# Patient Record
Sex: Female | Born: 1935
Health system: Southern US, Community
[De-identification: ages and names within clinical notes are randomized; demographics above are authoritative.]

## PROBLEM LIST (undated history)

## (undated) DIAGNOSIS — I340 Nonrheumatic mitral (valve) insufficiency: Secondary | ICD-10-CM

## (undated) DIAGNOSIS — M1990 Unspecified osteoarthritis, unspecified site: Secondary | ICD-10-CM

## (undated) DIAGNOSIS — M069 Rheumatoid arthritis, unspecified: Secondary | ICD-10-CM

## (undated) DIAGNOSIS — M797 Fibromyalgia: Secondary | ICD-10-CM

## (undated) DIAGNOSIS — K219 Gastro-esophageal reflux disease without esophagitis: Secondary | ICD-10-CM

## (undated) DIAGNOSIS — F32A Depression, unspecified: Secondary | ICD-10-CM

## (undated) DIAGNOSIS — F419 Anxiety disorder, unspecified: Secondary | ICD-10-CM

## (undated) DIAGNOSIS — K625 Hemorrhage of anus and rectum: Secondary | ICD-10-CM

## (undated) DIAGNOSIS — I4891 Unspecified atrial fibrillation: Secondary | ICD-10-CM

## (undated) DIAGNOSIS — G459 Transient cerebral ischemic attack, unspecified: Secondary | ICD-10-CM

## (undated) DIAGNOSIS — J449 Chronic obstructive pulmonary disease, unspecified: Secondary | ICD-10-CM

## (undated) DIAGNOSIS — K648 Other hemorrhoids: Secondary | ICD-10-CM

## (undated) DIAGNOSIS — K449 Diaphragmatic hernia without obstruction or gangrene: Secondary | ICD-10-CM

## (undated) DIAGNOSIS — I10 Essential (primary) hypertension: Secondary | ICD-10-CM

## (undated) DIAGNOSIS — M549 Dorsalgia, unspecified: Secondary | ICD-10-CM

## (undated) DIAGNOSIS — Z9889 Other specified postprocedural states: Secondary | ICD-10-CM

## (undated) DIAGNOSIS — Z5189 Encounter for other specified aftercare: Secondary | ICD-10-CM

## (undated) DIAGNOSIS — I2510 Atherosclerotic heart disease of native coronary artery without angina pectoris: Secondary | ICD-10-CM

## (undated) DIAGNOSIS — M810 Age-related osteoporosis without current pathological fracture: Secondary | ICD-10-CM

## (undated) DIAGNOSIS — M545 Low back pain, unspecified: Secondary | ICD-10-CM

## (undated) DIAGNOSIS — E785 Hyperlipidemia, unspecified: Secondary | ICD-10-CM

## (undated) DIAGNOSIS — R32 Unspecified urinary incontinence: Secondary | ICD-10-CM

## (undated) DIAGNOSIS — R112 Nausea with vomiting, unspecified: Secondary | ICD-10-CM

## (undated) DIAGNOSIS — H269 Unspecified cataract: Secondary | ICD-10-CM

## (undated) DIAGNOSIS — K222 Esophageal obstruction: Secondary | ICD-10-CM

## (undated) DIAGNOSIS — R195 Other fecal abnormalities: Secondary | ICD-10-CM

## (undated) DIAGNOSIS — D649 Anemia, unspecified: Secondary | ICD-10-CM

## (undated) DIAGNOSIS — J189 Pneumonia, unspecified organism: Secondary | ICD-10-CM

## (undated) DIAGNOSIS — G8929 Other chronic pain: Secondary | ICD-10-CM

## (undated) DIAGNOSIS — F319 Bipolar disorder, unspecified: Secondary | ICD-10-CM

## (undated) DIAGNOSIS — M479 Spondylosis, unspecified: Secondary | ICD-10-CM

## (undated) DIAGNOSIS — R011 Cardiac murmur, unspecified: Secondary | ICD-10-CM

## (undated) DIAGNOSIS — F329 Major depressive disorder, single episode, unspecified: Secondary | ICD-10-CM

## (undated) HISTORY — DX: Gastro-esophageal reflux disease without esophagitis: K21.9

## (undated) HISTORY — DX: Chronic obstructive pulmonary disease, unspecified: J44.9

## (undated) HISTORY — DX: Unspecified atrial fibrillation: I48.91

## (undated) HISTORY — PX: JOINT REPLACEMENT: SHX530

## (undated) HISTORY — DX: Essential (primary) hypertension: I10

## (undated) HISTORY — DX: Age-related osteoporosis without current pathological fracture: M81.0

## (undated) HISTORY — DX: Unspecified cataract: H26.9

## (undated) HISTORY — DX: Other fecal abnormalities: R19.5

## (undated) HISTORY — DX: Esophageal obstruction: K22.2

## (undated) HISTORY — DX: Low back pain, unspecified: M54.50

## (undated) HISTORY — DX: Diaphragmatic hernia without obstruction or gangrene: K44.9

## (undated) HISTORY — DX: Low back pain: M54.5

## (undated) HISTORY — DX: Major depressive disorder, single episode, unspecified: F32.9

## (undated) HISTORY — DX: Anxiety disorder, unspecified: F41.9

## (undated) HISTORY — DX: Bipolar disorder, unspecified: F31.9

## (undated) HISTORY — DX: Transient cerebral ischemic attack, unspecified: G45.9

## (undated) HISTORY — DX: Hyperlipidemia, unspecified: E78.5

## (undated) HISTORY — DX: Depression, unspecified: F32.A

## (undated) HISTORY — DX: Spondylosis, unspecified: M47.9

## (undated) HISTORY — DX: Anemia, unspecified: D64.9

## (undated) HISTORY — DX: Fibromyalgia: M79.7

## (undated) HISTORY — DX: Other hemorrhoids: K64.8

## (undated) HISTORY — DX: Atherosclerotic heart disease of native coronary artery without angina pectoris: I25.10

## (undated) HISTORY — PX: BACK SURGERY: SHX140

## (undated) HISTORY — PX: TUBAL LIGATION: SHX77

## (undated) HISTORY — PX: TOTAL KNEE ARTHROPLASTY: SHX125

## (undated) HISTORY — PX: LUMBAR LAMINECTOMY: SHX95

## (undated) HISTORY — DX: Hemorrhage of anus and rectum: K62.5

## (undated) HISTORY — PX: HERNIA REPAIR: SHX51

## (undated) HISTORY — DX: Nonrheumatic mitral (valve) insufficiency: I34.0

## (undated) HISTORY — PX: BLADDER SUSPENSION: SHX72

---
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
## 1959-11-05 HISTORY — PX: DILATION AND CURETTAGE OF UTERUS: SHX78

## 1980-11-04 HISTORY — PX: ABDOMINAL HYSTERECTOMY: SHX81

## 1989-11-04 DIAGNOSIS — Z5189 Encounter for other specified aftercare: Secondary | ICD-10-CM

## 1989-11-04 DIAGNOSIS — IMO0001 Reserved for inherently not codable concepts without codable children: Secondary | ICD-10-CM

## 1989-11-04 HISTORY — DX: Encounter for other specified aftercare: Z51.89

## 1989-11-04 HISTORY — DX: Reserved for inherently not codable concepts without codable children: IMO0001

## 1999-06-25 ENCOUNTER — Other Ambulatory Visit: Admission: RE | Admit: 1999-06-25 | Discharge: 1999-06-25 | Payer: Self-pay | Admitting: Family Medicine

## 1999-07-25 ENCOUNTER — Encounter: Payer: Self-pay | Admitting: Emergency Medicine

## 1999-07-25 ENCOUNTER — Emergency Department (HOSPITAL_COMMUNITY): Admission: EM | Admit: 1999-07-25 | Discharge: 1999-07-25 | Payer: Self-pay | Admitting: Emergency Medicine

## 2000-01-07 ENCOUNTER — Encounter: Payer: Self-pay | Admitting: Family Medicine

## 2000-01-07 ENCOUNTER — Encounter: Admission: RE | Admit: 2000-01-07 | Discharge: 2000-01-07 | Payer: Self-pay | Admitting: Family Medicine

## 2000-04-14 ENCOUNTER — Ambulatory Visit (HOSPITAL_COMMUNITY): Admission: RE | Admit: 2000-04-14 | Discharge: 2000-04-14 | Payer: Self-pay | Admitting: Neurosurgery

## 2000-04-14 ENCOUNTER — Encounter: Payer: Self-pay | Admitting: Neurosurgery

## 2000-05-27 ENCOUNTER — Other Ambulatory Visit: Admission: RE | Admit: 2000-05-27 | Discharge: 2000-05-27 | Payer: Self-pay | Admitting: *Deleted

## 2000-06-02 ENCOUNTER — Encounter: Admission: RE | Admit: 2000-06-02 | Discharge: 2000-06-02 | Payer: Self-pay | Admitting: Neurosurgery

## 2000-06-02 ENCOUNTER — Encounter: Payer: Self-pay | Admitting: Neurosurgery

## 2000-06-12 ENCOUNTER — Ambulatory Visit (HOSPITAL_COMMUNITY): Admission: RE | Admit: 2000-06-12 | Discharge: 2000-06-12 | Payer: Self-pay | Admitting: Neurosurgery

## 2000-06-12 ENCOUNTER — Encounter: Payer: Self-pay | Admitting: Neurosurgery

## 2001-05-10 ENCOUNTER — Encounter: Payer: Self-pay | Admitting: Emergency Medicine

## 2001-05-10 ENCOUNTER — Inpatient Hospital Stay (HOSPITAL_COMMUNITY): Admission: EM | Admit: 2001-05-10 | Discharge: 2001-05-14 | Payer: Self-pay | Admitting: Emergency Medicine

## 2001-05-11 ENCOUNTER — Encounter: Payer: Self-pay | Admitting: Cardiology

## 2001-05-14 ENCOUNTER — Encounter: Payer: Self-pay | Admitting: Cardiology

## 2001-05-29 ENCOUNTER — Encounter: Admission: RE | Admit: 2001-05-29 | Discharge: 2001-05-29 | Payer: Self-pay | Admitting: Cardiovascular Disease

## 2001-06-04 ENCOUNTER — Ambulatory Visit (HOSPITAL_COMMUNITY): Admission: RE | Admit: 2001-06-04 | Discharge: 2001-06-04 | Payer: Self-pay | Admitting: Internal Medicine

## 2001-06-04 ENCOUNTER — Encounter: Payer: Self-pay | Admitting: Internal Medicine

## 2001-11-04 ENCOUNTER — Encounter: Payer: Self-pay | Admitting: Family Medicine

## 2001-11-04 LAB — CONVERTED CEMR LAB

## 2002-01-22 ENCOUNTER — Encounter: Admission: RE | Admit: 2002-01-22 | Discharge: 2002-01-22 | Payer: Self-pay | Admitting: Neurosurgery

## 2002-01-22 ENCOUNTER — Encounter: Payer: Self-pay | Admitting: Neurosurgery

## 2002-02-05 ENCOUNTER — Encounter: Admission: RE | Admit: 2002-02-05 | Discharge: 2002-02-05 | Payer: Self-pay | Admitting: Neurosurgery

## 2002-02-05 ENCOUNTER — Encounter: Payer: Self-pay | Admitting: Neurosurgery

## 2002-02-26 ENCOUNTER — Encounter: Admission: RE | Admit: 2002-02-26 | Discharge: 2002-02-26 | Payer: Self-pay | Admitting: Neurosurgery

## 2002-02-26 ENCOUNTER — Encounter: Payer: Self-pay | Admitting: Neurosurgery

## 2002-04-30 ENCOUNTER — Encounter: Payer: Self-pay | Admitting: Family Medicine

## 2002-04-30 ENCOUNTER — Encounter: Admission: RE | Admit: 2002-04-30 | Discharge: 2002-04-30 | Payer: Self-pay | Admitting: Family Medicine

## 2003-07-13 ENCOUNTER — Encounter: Payer: Self-pay | Admitting: Radiology

## 2003-07-13 ENCOUNTER — Encounter: Payer: Self-pay | Admitting: Neurosurgery

## 2003-07-13 ENCOUNTER — Encounter: Admission: RE | Admit: 2003-07-13 | Discharge: 2003-07-13 | Payer: Self-pay | Admitting: Neurosurgery

## 2003-07-28 ENCOUNTER — Encounter: Admission: RE | Admit: 2003-07-28 | Discharge: 2003-07-28 | Payer: Self-pay | Admitting: Neurosurgery

## 2003-07-28 ENCOUNTER — Encounter: Payer: Self-pay | Admitting: Neurosurgery

## 2003-08-11 ENCOUNTER — Encounter: Payer: Self-pay | Admitting: Neurosurgery

## 2003-08-11 ENCOUNTER — Encounter: Admission: RE | Admit: 2003-08-11 | Discharge: 2003-08-11 | Payer: Self-pay | Admitting: Neurosurgery

## 2003-09-28 ENCOUNTER — Encounter: Admission: RE | Admit: 2003-09-28 | Discharge: 2003-09-28 | Payer: Self-pay | Admitting: Family Medicine

## 2003-11-09 ENCOUNTER — Encounter: Admission: RE | Admit: 2003-11-09 | Discharge: 2003-11-09 | Payer: Self-pay | Admitting: Family Medicine

## 2004-02-08 ENCOUNTER — Ambulatory Visit (HOSPITAL_COMMUNITY): Admission: RE | Admit: 2004-02-08 | Discharge: 2004-02-08 | Payer: Self-pay | Admitting: Cardiovascular Disease

## 2004-05-15 ENCOUNTER — Inpatient Hospital Stay (HOSPITAL_COMMUNITY): Admission: RE | Admit: 2004-05-15 | Discharge: 2004-05-20 | Payer: Self-pay | Admitting: Orthopedic Surgery

## 2004-06-28 ENCOUNTER — Ambulatory Visit (HOSPITAL_COMMUNITY): Admission: RE | Admit: 2004-06-28 | Discharge: 2004-06-28 | Payer: Self-pay | Admitting: Orthopedic Surgery

## 2004-09-19 ENCOUNTER — Ambulatory Visit: Payer: Self-pay | Admitting: Family Medicine

## 2004-12-04 ENCOUNTER — Ambulatory Visit: Payer: Self-pay | Admitting: Family Medicine

## 2004-12-19 ENCOUNTER — Ambulatory Visit: Payer: Self-pay | Admitting: Family Medicine

## 2005-01-01 ENCOUNTER — Ambulatory Visit: Payer: Self-pay | Admitting: Family Medicine

## 2005-01-22 ENCOUNTER — Ambulatory Visit: Payer: Self-pay | Admitting: Family Medicine

## 2005-02-15 ENCOUNTER — Encounter: Admission: RE | Admit: 2005-02-15 | Discharge: 2005-02-15 | Payer: Self-pay | Admitting: Family Medicine

## 2005-02-21 ENCOUNTER — Ambulatory Visit: Payer: Self-pay | Admitting: Gastroenterology

## 2005-02-25 ENCOUNTER — Ambulatory Visit (HOSPITAL_COMMUNITY): Admission: RE | Admit: 2005-02-25 | Discharge: 2005-02-25 | Payer: Self-pay | Admitting: Internal Medicine

## 2005-02-25 ENCOUNTER — Ambulatory Visit: Payer: Self-pay | Admitting: Internal Medicine

## 2005-03-01 ENCOUNTER — Ambulatory Visit: Payer: Self-pay | Admitting: Family Medicine

## 2005-03-04 ENCOUNTER — Emergency Department (HOSPITAL_COMMUNITY): Admission: EM | Admit: 2005-03-04 | Discharge: 2005-03-04 | Payer: Self-pay | Admitting: Emergency Medicine

## 2005-03-06 ENCOUNTER — Ambulatory Visit: Payer: Self-pay | Admitting: Family Medicine

## 2005-03-11 ENCOUNTER — Ambulatory Visit: Payer: Self-pay | Admitting: Internal Medicine

## 2005-03-14 ENCOUNTER — Ambulatory Visit: Payer: Self-pay | Admitting: Family Medicine

## 2005-03-19 ENCOUNTER — Encounter: Admission: RE | Admit: 2005-03-19 | Discharge: 2005-03-19 | Payer: Self-pay | Admitting: Neurology

## 2005-03-26 ENCOUNTER — Ambulatory Visit: Payer: Self-pay | Admitting: Family Medicine

## 2005-04-05 ENCOUNTER — Ambulatory Visit: Payer: Self-pay | Admitting: Family Medicine

## 2005-04-10 ENCOUNTER — Ambulatory Visit: Payer: Self-pay | Admitting: Family Medicine

## 2005-06-04 ENCOUNTER — Inpatient Hospital Stay (HOSPITAL_COMMUNITY): Admission: RE | Admit: 2005-06-04 | Discharge: 2005-06-13 | Payer: Self-pay | Admitting: Neurosurgery

## 2005-06-07 ENCOUNTER — Ambulatory Visit: Payer: Self-pay | Admitting: Pulmonary Disease

## 2005-06-11 ENCOUNTER — Ambulatory Visit: Payer: Self-pay | Admitting: Cardiology

## 2005-06-11 ENCOUNTER — Encounter: Payer: Self-pay | Admitting: Cardiology

## 2005-09-03 ENCOUNTER — Ambulatory Visit: Payer: Self-pay | Admitting: Family Medicine

## 2005-09-03 ENCOUNTER — Encounter: Admission: RE | Admit: 2005-09-03 | Discharge: 2005-09-03 | Payer: Self-pay | Admitting: Family Medicine

## 2006-04-08 ENCOUNTER — Ambulatory Visit: Payer: Self-pay | Admitting: Family Medicine

## 2006-04-09 ENCOUNTER — Ambulatory Visit: Payer: Self-pay | Admitting: Family Medicine

## 2006-04-17 ENCOUNTER — Ambulatory Visit: Payer: Self-pay

## 2006-06-19 ENCOUNTER — Ambulatory Visit: Payer: Self-pay | Admitting: Cardiovascular Disease

## 2006-07-17 ENCOUNTER — Ambulatory Visit: Payer: Self-pay | Admitting: Cardiovascular Disease

## 2006-07-17 ENCOUNTER — Ambulatory Visit: Payer: Self-pay | Admitting: Cardiology

## 2006-07-17 ENCOUNTER — Encounter: Payer: Self-pay | Admitting: Cardiology

## 2006-07-17 ENCOUNTER — Ambulatory Visit: Payer: Self-pay

## 2007-04-29 ENCOUNTER — Encounter: Payer: Self-pay | Admitting: Family Medicine

## 2007-04-29 DIAGNOSIS — F411 Generalized anxiety disorder: Secondary | ICD-10-CM | POA: Insufficient documentation

## 2007-04-29 DIAGNOSIS — M1990 Unspecified osteoarthritis, unspecified site: Secondary | ICD-10-CM | POA: Insufficient documentation

## 2007-04-29 DIAGNOSIS — K449 Diaphragmatic hernia without obstruction or gangrene: Secondary | ICD-10-CM | POA: Insufficient documentation

## 2007-04-29 DIAGNOSIS — K219 Gastro-esophageal reflux disease without esophagitis: Secondary | ICD-10-CM | POA: Insufficient documentation

## 2007-04-29 DIAGNOSIS — M479 Spondylosis, unspecified: Secondary | ICD-10-CM | POA: Insufficient documentation

## 2007-04-29 DIAGNOSIS — I10 Essential (primary) hypertension: Secondary | ICD-10-CM | POA: Insufficient documentation

## 2007-04-30 ENCOUNTER — Other Ambulatory Visit: Admission: RE | Admit: 2007-04-30 | Discharge: 2007-04-30 | Payer: Self-pay | Admitting: Family Medicine

## 2007-04-30 ENCOUNTER — Ambulatory Visit: Payer: Self-pay | Admitting: Family Medicine

## 2007-04-30 ENCOUNTER — Encounter: Payer: Self-pay | Admitting: Family Medicine

## 2007-04-30 LAB — CONVERTED CEMR LAB
Basophils Relative: 1.4 % — ABNORMAL HIGH (ref 0.0–1.0)
CO2: 35 meq/L — ABNORMAL HIGH (ref 19–32)
Creatinine, Ser: 0.9 mg/dL (ref 0.4–1.2)
Direct LDL: 168.7 mg/dL
FSH: 67.6 milliintl units/mL
HCT: 39.3 % (ref 36.0–46.0)
Hemoglobin: 13.5 g/dL (ref 12.0–15.0)
MCHC: 34.3 g/dL (ref 30.0–36.0)
Monocytes Absolute: 0.5 10*3/uL (ref 0.2–0.7)
Neutrophils Relative %: 70.1 % (ref 43.0–77.0)
Potassium: 3.5 meq/L (ref 3.5–5.1)
RDW: 12.2 % (ref 11.5–14.6)
Sodium: 142 meq/L (ref 135–145)
Total Bilirubin: 0.9 mg/dL (ref 0.3–1.2)
Total CHOL/HDL Ratio: 5.5
Total Protein: 7.6 g/dL (ref 6.0–8.3)
VLDL: 85 mg/dL — ABNORMAL HIGH (ref 0–40)

## 2007-05-25 ENCOUNTER — Ambulatory Visit: Payer: Self-pay | Admitting: Internal Medicine

## 2007-05-29 ENCOUNTER — Encounter: Admission: RE | Admit: 2007-05-29 | Discharge: 2007-05-29 | Payer: Self-pay | Admitting: Family Medicine

## 2007-06-04 ENCOUNTER — Encounter: Admission: RE | Admit: 2007-06-04 | Discharge: 2007-06-04 | Payer: Self-pay | Admitting: Family Medicine

## 2007-06-04 ENCOUNTER — Encounter: Payer: Self-pay | Admitting: Family Medicine

## 2007-06-05 ENCOUNTER — Encounter: Payer: Self-pay | Admitting: Internal Medicine

## 2007-06-08 ENCOUNTER — Encounter: Payer: Self-pay | Admitting: Family Medicine

## 2007-06-08 ENCOUNTER — Ambulatory Visit: Payer: Self-pay | Admitting: Internal Medicine

## 2007-06-18 ENCOUNTER — Ambulatory Visit (HOSPITAL_COMMUNITY): Admission: RE | Admit: 2007-06-18 | Discharge: 2007-06-18 | Payer: Self-pay | Admitting: Internal Medicine

## 2007-06-18 ENCOUNTER — Encounter: Payer: Self-pay | Admitting: Family Medicine

## 2007-06-24 ENCOUNTER — Ambulatory Visit: Payer: Self-pay | Admitting: Internal Medicine

## 2007-07-21 ENCOUNTER — Ambulatory Visit: Payer: Self-pay | Admitting: Internal Medicine

## 2007-07-24 ENCOUNTER — Encounter: Payer: Self-pay | Admitting: Family Medicine

## 2007-09-04 ENCOUNTER — Ambulatory Visit: Payer: Self-pay | Admitting: Family Medicine

## 2007-09-23 ENCOUNTER — Encounter: Payer: Self-pay | Admitting: Family Medicine

## 2008-01-06 ENCOUNTER — Encounter: Payer: Self-pay | Admitting: Family Medicine

## 2008-01-06 ENCOUNTER — Telehealth: Payer: Self-pay | Admitting: Family Medicine

## 2008-01-19 DIAGNOSIS — K222 Esophageal obstruction: Secondary | ICD-10-CM | POA: Insufficient documentation

## 2008-01-19 DIAGNOSIS — K648 Other hemorrhoids: Secondary | ICD-10-CM | POA: Insufficient documentation

## 2008-01-19 DIAGNOSIS — K449 Diaphragmatic hernia without obstruction or gangrene: Secondary | ICD-10-CM | POA: Insufficient documentation

## 2008-03-17 ENCOUNTER — Telehealth: Payer: Self-pay | Admitting: Family Medicine

## 2008-04-13 ENCOUNTER — Encounter: Payer: Self-pay | Admitting: Family Medicine

## 2008-04-19 ENCOUNTER — Encounter: Admission: RE | Admit: 2008-04-19 | Discharge: 2008-04-19 | Payer: Self-pay | Admitting: Neurosurgery

## 2008-05-10 ENCOUNTER — Ambulatory Visit: Payer: Self-pay | Admitting: Family Medicine

## 2008-05-10 DIAGNOSIS — R05 Cough: Secondary | ICD-10-CM | POA: Insufficient documentation

## 2008-05-10 DIAGNOSIS — F329 Major depressive disorder, single episode, unspecified: Secondary | ICD-10-CM | POA: Insufficient documentation

## 2008-05-10 DIAGNOSIS — M949 Disorder of cartilage, unspecified: Secondary | ICD-10-CM

## 2008-05-10 DIAGNOSIS — R0600 Dyspnea, unspecified: Secondary | ICD-10-CM | POA: Insufficient documentation

## 2008-05-10 DIAGNOSIS — T50995A Adverse effect of other drugs, medicaments and biological substances, initial encounter: Secondary | ICD-10-CM | POA: Insufficient documentation

## 2008-05-10 DIAGNOSIS — I6529 Occlusion and stenosis of unspecified carotid artery: Secondary | ICD-10-CM | POA: Insufficient documentation

## 2008-05-10 DIAGNOSIS — R058 Other specified cough: Secondary | ICD-10-CM | POA: Insufficient documentation

## 2008-05-10 DIAGNOSIS — E785 Hyperlipidemia, unspecified: Secondary | ICD-10-CM | POA: Insufficient documentation

## 2008-05-10 DIAGNOSIS — M549 Dorsalgia, unspecified: Secondary | ICD-10-CM

## 2008-05-10 DIAGNOSIS — K625 Hemorrhage of anus and rectum: Secondary | ICD-10-CM | POA: Insufficient documentation

## 2008-05-10 DIAGNOSIS — N390 Urinary tract infection, site not specified: Secondary | ICD-10-CM | POA: Insufficient documentation

## 2008-05-10 DIAGNOSIS — M899 Disorder of bone, unspecified: Secondary | ICD-10-CM | POA: Insufficient documentation

## 2008-05-10 DIAGNOSIS — D649 Anemia, unspecified: Secondary | ICD-10-CM | POA: Insufficient documentation

## 2008-05-10 DIAGNOSIS — G459 Transient cerebral ischemic attack, unspecified: Secondary | ICD-10-CM | POA: Insufficient documentation

## 2008-05-10 DIAGNOSIS — F3289 Other specified depressive episodes: Secondary | ICD-10-CM | POA: Insufficient documentation

## 2008-05-10 DIAGNOSIS — E039 Hypothyroidism, unspecified: Secondary | ICD-10-CM | POA: Insufficient documentation

## 2008-05-10 DIAGNOSIS — G8929 Other chronic pain: Secondary | ICD-10-CM | POA: Insufficient documentation

## 2008-05-10 LAB — CONVERTED CEMR LAB
Glucose, Urine, Semiquant: NEGATIVE
Specific Gravity, Urine: 1.02
pH: 5

## 2008-05-11 ENCOUNTER — Telehealth: Payer: Self-pay

## 2008-05-13 ENCOUNTER — Ambulatory Visit: Payer: Self-pay | Admitting: Family Medicine

## 2008-05-16 LAB — CONVERTED CEMR LAB: Vit D, 1,25-Dihydroxy: 31 (ref 30–89)

## 2008-05-17 ENCOUNTER — Ambulatory Visit: Payer: Self-pay | Admitting: Family Medicine

## 2008-05-23 ENCOUNTER — Ambulatory Visit: Payer: Self-pay

## 2008-05-23 ENCOUNTER — Encounter: Payer: Self-pay | Admitting: Family Medicine

## 2008-05-23 LAB — CONVERTED CEMR LAB
ALT: 26 units/L (ref 0–35)
AST: 20 units/L (ref 0–37)
Albumin: 4.1 g/dL (ref 3.5–5.2)
Alkaline Phosphatase: 58 units/L (ref 39–117)
BUN: 20 mg/dL (ref 6–23)
CO2: 29 meq/L (ref 19–32)
Eosinophils Relative: 2.7 % (ref 0.0–5.0)
GFR calc Af Amer: 91 mL/min
Glucose, Bld: 103 mg/dL — ABNORMAL HIGH (ref 70–99)
HCT: 37.1 % (ref 36.0–46.0)
Hemoglobin: 12.6 g/dL (ref 12.0–15.0)
Monocytes Absolute: 0.4 10*3/uL (ref 0.1–1.0)
Monocytes Relative: 7.2 % (ref 3.0–12.0)
Neutro Abs: 3.2 10*3/uL (ref 1.4–7.7)
Platelets: 234 10*3/uL (ref 150–400)
Potassium: 3.5 meq/L (ref 3.5–5.1)
Total CHOL/HDL Ratio: 6.4
Total Protein: 6.9 g/dL (ref 6.0–8.3)
WBC: 5.1 10*3/uL (ref 4.5–10.5)

## 2008-06-22 ENCOUNTER — Ambulatory Visit: Payer: Self-pay | Admitting: Family Medicine

## 2008-06-22 LAB — CONVERTED CEMR LAB
Bilirubin Urine: NEGATIVE
Blood in Urine, dipstick: NEGATIVE
Glucose, Urine, Semiquant: NEGATIVE
Ketones, urine, test strip: NEGATIVE
Protein, U semiquant: NEGATIVE
Specific Gravity, Urine: 1.01
pH: 5.5

## 2008-07-06 ENCOUNTER — Ambulatory Visit: Payer: Self-pay | Admitting: Family Medicine

## 2008-07-13 DIAGNOSIS — K921 Melena: Secondary | ICD-10-CM | POA: Insufficient documentation

## 2008-07-13 LAB — CONVERTED CEMR LAB
OCCULT 1: NEGATIVE
OCCULT 2: NEGATIVE
OCCULT 3: POSITIVE

## 2008-07-28 ENCOUNTER — Encounter: Admission: RE | Admit: 2008-07-28 | Discharge: 2008-07-28 | Payer: Self-pay | Admitting: Family Medicine

## 2008-07-28 ENCOUNTER — Encounter: Payer: Self-pay | Admitting: Family Medicine

## 2008-08-01 ENCOUNTER — Ambulatory Visit: Payer: Self-pay | Admitting: Internal Medicine

## 2008-08-01 DIAGNOSIS — R195 Other fecal abnormalities: Secondary | ICD-10-CM | POA: Insufficient documentation

## 2008-08-01 DIAGNOSIS — R1319 Other dysphagia: Secondary | ICD-10-CM | POA: Insufficient documentation

## 2008-08-02 ENCOUNTER — Ambulatory Visit: Payer: Self-pay | Admitting: Internal Medicine

## 2008-08-04 ENCOUNTER — Telehealth: Payer: Self-pay | Admitting: Family Medicine

## 2008-08-16 ENCOUNTER — Ambulatory Visit: Payer: Self-pay | Admitting: Family Medicine

## 2008-08-16 DIAGNOSIS — J449 Chronic obstructive pulmonary disease, unspecified: Secondary | ICD-10-CM | POA: Insufficient documentation

## 2008-08-16 DIAGNOSIS — J069 Acute upper respiratory infection, unspecified: Secondary | ICD-10-CM | POA: Insufficient documentation

## 2008-08-16 DIAGNOSIS — J209 Acute bronchitis, unspecified: Secondary | ICD-10-CM | POA: Insufficient documentation

## 2008-08-16 DIAGNOSIS — J4489 Other specified chronic obstructive pulmonary disease: Secondary | ICD-10-CM | POA: Insufficient documentation

## 2008-08-23 LAB — CONVERTED CEMR LAB
ALT: 31 units/L (ref 0–35)
Albumin: 4 g/dL (ref 3.5–5.2)
Basophils Absolute: 0.1 10*3/uL (ref 0.0–0.1)
Basophils Relative: 1 % (ref 0.0–3.0)
Cholesterol: 243 mg/dL (ref 0–200)
Direct LDL: 104.8 mg/dL
HCT: 37 % (ref 36.0–46.0)
Hemoglobin: 12.9 g/dL (ref 12.0–15.0)
MCHC: 35 g/dL (ref 30.0–36.0)
Monocytes Absolute: 0.5 10*3/uL (ref 0.1–1.0)
Neutro Abs: 5.2 10*3/uL (ref 1.4–7.7)
RBC: 3.99 M/uL (ref 3.87–5.11)
RDW: 12.3 % (ref 11.5–14.6)
Total Bilirubin: 0.8 mg/dL (ref 0.3–1.2)

## 2008-09-26 ENCOUNTER — Encounter: Payer: Self-pay | Admitting: Family Medicine

## 2008-09-30 ENCOUNTER — Encounter: Admission: RE | Admit: 2008-09-30 | Discharge: 2008-09-30 | Payer: Self-pay | Admitting: Neurosurgery

## 2008-10-07 ENCOUNTER — Ambulatory Visit: Payer: Self-pay | Admitting: Internal Medicine

## 2008-10-13 ENCOUNTER — Ambulatory Visit: Payer: Self-pay | Admitting: Internal Medicine

## 2008-11-04 HISTORY — PX: CATARACT EXTRACTION W/ INTRAOCULAR LENS  IMPLANT, BILATERAL: SHX1307

## 2008-11-09 ENCOUNTER — Encounter: Payer: Self-pay | Admitting: Family Medicine

## 2008-11-14 ENCOUNTER — Ambulatory Visit: Payer: Self-pay | Admitting: Internal Medicine

## 2008-11-25 ENCOUNTER — Ambulatory Visit (HOSPITAL_COMMUNITY): Admission: RE | Admit: 2008-11-25 | Discharge: 2008-11-25 | Payer: Self-pay | Admitting: Orthopedic Surgery

## 2008-12-02 ENCOUNTER — Ambulatory Visit: Payer: Self-pay | Admitting: Internal Medicine

## 2008-12-02 DIAGNOSIS — R0989 Other specified symptoms and signs involving the circulatory and respiratory systems: Secondary | ICD-10-CM | POA: Insufficient documentation

## 2008-12-02 DIAGNOSIS — R0609 Other forms of dyspnea: Secondary | ICD-10-CM | POA: Insufficient documentation

## 2009-01-06 ENCOUNTER — Telehealth: Payer: Self-pay | Admitting: Family Medicine

## 2009-01-17 ENCOUNTER — Telehealth: Payer: Self-pay | Admitting: Family Medicine

## 2009-01-18 ENCOUNTER — Encounter: Payer: Self-pay | Admitting: Family Medicine

## 2009-02-10 ENCOUNTER — Ambulatory Visit (HOSPITAL_COMMUNITY): Admission: RE | Admit: 2009-02-10 | Discharge: 2009-02-12 | Payer: Self-pay | Admitting: Neurosurgery

## 2009-02-22 ENCOUNTER — Encounter: Payer: Self-pay | Admitting: Family Medicine

## 2009-04-10 ENCOUNTER — Encounter: Payer: Self-pay | Admitting: Family Medicine

## 2009-05-15 ENCOUNTER — Ambulatory Visit: Payer: Self-pay | Admitting: Family Medicine

## 2009-05-15 LAB — CONVERTED CEMR LAB
Bilirubin Urine: NEGATIVE
Glucose, Urine, Semiquant: NEGATIVE
Urobilinogen, UA: 0.2

## 2009-05-16 ENCOUNTER — Encounter: Payer: Self-pay | Admitting: Family Medicine

## 2009-05-22 ENCOUNTER — Ambulatory Visit: Payer: Self-pay | Admitting: Family Medicine

## 2009-05-22 DIAGNOSIS — M545 Low back pain, unspecified: Secondary | ICD-10-CM | POA: Insufficient documentation

## 2009-05-22 LAB — CONVERTED CEMR LAB
Blood in Urine, dipstick: NEGATIVE
Glucose, Urine, Semiquant: NEGATIVE
Nitrite: NEGATIVE
Protein, U semiquant: NEGATIVE
pH: 5.5

## 2009-07-06 ENCOUNTER — Ambulatory Visit: Payer: Self-pay | Admitting: Family Medicine

## 2009-07-13 ENCOUNTER — Telehealth: Payer: Self-pay | Admitting: Family Medicine

## 2009-07-19 ENCOUNTER — Ambulatory Visit: Payer: Self-pay | Admitting: Family Medicine

## 2009-07-19 DIAGNOSIS — L989 Disorder of the skin and subcutaneous tissue, unspecified: Secondary | ICD-10-CM | POA: Insufficient documentation

## 2009-07-19 DIAGNOSIS — N342 Other urethritis: Secondary | ICD-10-CM | POA: Insufficient documentation

## 2009-07-19 LAB — CONVERTED CEMR LAB
ALT: 15 units/L (ref 0–35)
AST: 19 units/L (ref 0–37)
Albumin: 4 g/dL (ref 3.5–5.2)
Alkaline Phosphatase: 71 units/L (ref 39–117)
Basophils Absolute: 0 10*3/uL (ref 0.0–0.1)
Bilirubin Urine: NEGATIVE
Calcium: 9.1 mg/dL (ref 8.4–10.5)
Eosinophils Relative: 3.8 % (ref 0.0–5.0)
GFR calc non Af Amer: 58.01 mL/min (ref >60)
Glucose, Bld: 103 mg/dL — ABNORMAL HIGH (ref 70–99)
HCT: 36.1 % (ref 36.0–46.0)
HDL: 46.3 mg/dL (ref >39.00)
Hemoglobin: 12.5 g/dL (ref 12.0–15.0)
Ketones, urine, test strip: NEGATIVE
Lymphocytes Relative: 24 % (ref 12.0–46.0)
Monocytes Relative: 6.7 % (ref 3.0–12.0)
Neutro Abs: 4.7 10*3/uL (ref 1.4–7.7)
Nitrite: NEGATIVE
Potassium: 3.5 meq/L (ref 3.5–5.1)
Protein, U semiquant: NEGATIVE
RDW: 12.3 % (ref 11.5–14.6)
Sodium: 143 meq/L (ref 135–145)
Total Bilirubin: 0.8 mg/dL (ref 0.3–1.2)
Total CHOL/HDL Ratio: 5
Triglycerides: 236 mg/dL — ABNORMAL HIGH (ref 0.0–149.0)
Urobilinogen, UA: 0.2
VLDL: 47.2 mg/dL — ABNORMAL HIGH (ref 0.0–40.0)
WBC: 7.2 10*3/uL (ref 4.5–10.5)

## 2009-07-19 LAB — HM COLONOSCOPY

## 2009-07-20 LAB — CONVERTED CEMR LAB
Blood in Urine, dipstick: NEGATIVE
Ketones, urine, test strip: NEGATIVE
Nitrite: NEGATIVE
Protein, U semiquant: NEGATIVE
Specific Gravity, Urine: 1.015
Urobilinogen, UA: 0.2

## 2009-08-17 ENCOUNTER — Telehealth: Payer: Self-pay | Admitting: Family Medicine

## 2009-11-01 ENCOUNTER — Telehealth: Payer: Self-pay | Admitting: Family Medicine

## 2009-11-16 ENCOUNTER — Telehealth: Payer: Self-pay | Admitting: Family Medicine

## 2010-01-09 ENCOUNTER — Telehealth: Payer: Self-pay | Admitting: Family Medicine

## 2010-01-10 ENCOUNTER — Ambulatory Visit: Payer: Self-pay | Admitting: Cardiovascular Disease

## 2010-01-10 ENCOUNTER — Ambulatory Visit: Payer: Self-pay | Admitting: Family Medicine

## 2010-01-16 ENCOUNTER — Telehealth: Payer: Self-pay | Admitting: Family Medicine

## 2010-01-17 LAB — CONVERTED CEMR LAB
Albumin: 3.7 g/dL (ref 3.5–5.2)
Cholesterol: 238 mg/dL — ABNORMAL HIGH (ref 0–200)
Direct LDL: 153.4 mg/dL
Total CHOL/HDL Ratio: 5
Triglycerides: 275 mg/dL — ABNORMAL HIGH (ref 0.0–149.0)
VLDL: 55 mg/dL — ABNORMAL HIGH (ref 0.0–40.0)

## 2010-01-26 ENCOUNTER — Ambulatory Visit: Payer: Self-pay

## 2010-01-26 ENCOUNTER — Encounter: Payer: Self-pay | Admitting: Cardiovascular Disease

## 2010-01-26 ENCOUNTER — Ambulatory Visit (HOSPITAL_COMMUNITY): Admission: RE | Admit: 2010-01-26 | Discharge: 2010-01-26 | Payer: Self-pay | Admitting: Cardiovascular Disease

## 2010-01-26 ENCOUNTER — Ambulatory Visit: Payer: Self-pay | Admitting: Cardiology

## 2010-02-08 ENCOUNTER — Ambulatory Visit: Payer: Self-pay | Admitting: Family Medicine

## 2010-02-28 ENCOUNTER — Encounter: Payer: Self-pay | Admitting: Family Medicine

## 2010-02-28 LAB — CONVERTED CEMR LAB
OCCULT 1: NEGATIVE
OCCULT 2: NEGATIVE
OCCULT 3: NEGATIVE

## 2010-04-10 ENCOUNTER — Ambulatory Visit: Payer: Self-pay | Admitting: Internal Medicine

## 2010-04-11 LAB — CONVERTED CEMR LAB
Basophils Relative: 0.8 % (ref 0.0–3.0)
Calcium: 9.7 mg/dL (ref 8.4–10.5)
Creatinine, Ser: 0.9 mg/dL (ref 0.4–1.2)
Eosinophils Absolute: 0.2 10*3/uL (ref 0.0–0.7)
GFR calc non Af Amer: 66.22 mL/min (ref >60)
Lymphocytes Relative: 21.1 % (ref 12.0–46.0)
MCHC: 34.5 g/dL (ref 30.0–36.0)
Neutrophils Relative %: 68.7 % (ref 43.0–77.0)
Pro B Natriuretic peptide (BNP): 288.9 pg/mL — ABNORMAL HIGH (ref 0.0–100.0)
RBC: 3.92 M/uL (ref 3.87–5.11)
WBC: 7.3 10*3/uL (ref 4.5–10.5)

## 2010-04-30 ENCOUNTER — Ambulatory Visit: Payer: Self-pay | Admitting: Internal Medicine

## 2010-05-01 ENCOUNTER — Telehealth: Payer: Self-pay | Admitting: Family Medicine

## 2010-08-22 ENCOUNTER — Telehealth: Payer: Self-pay | Admitting: Family Medicine

## 2010-10-31 ENCOUNTER — Ambulatory Visit
Admission: RE | Admit: 2010-10-31 | Discharge: 2010-10-31 | Payer: Self-pay | Source: Home / Self Care | Attending: Internal Medicine | Admitting: Internal Medicine

## 2010-10-31 DIAGNOSIS — R599 Enlarged lymph nodes, unspecified: Secondary | ICD-10-CM | POA: Insufficient documentation

## 2010-10-31 LAB — CONVERTED CEMR LAB
Basophils Absolute: 0.1 10*3/uL (ref 0.0–0.1)
Hemoglobin: 12.9 g/dL (ref 12.0–15.0)
Lymphocytes Relative: 18.6 % (ref 12.0–46.0)
Monocytes Relative: 6.4 % (ref 3.0–12.0)
Platelets: 265 10*3/uL (ref 150.0–400.0)
RDW: 13 % (ref 11.5–14.6)

## 2010-11-01 ENCOUNTER — Telehealth: Payer: Self-pay

## 2010-11-15 ENCOUNTER — Ambulatory Visit
Admission: RE | Admit: 2010-11-15 | Discharge: 2010-11-15 | Payer: Self-pay | Source: Home / Self Care | Attending: Internal Medicine | Admitting: Internal Medicine

## 2010-11-16 ENCOUNTER — Telehealth: Payer: Self-pay

## 2010-11-16 ENCOUNTER — Encounter: Payer: Self-pay | Admitting: Internal Medicine

## 2010-12-02 LAB — CONVERTED CEMR LAB: Pro B Natriuretic peptide (BNP): 69 pg/mL (ref 0.0–100.0)

## 2010-12-04 NOTE — Assessment & Plan Note (Signed)
Summary: Pulmonary/ ext f/u eval for recurrent sob   Copy to:  Dr. Berniece Andreas Primary Provider/Referring Provider:  Rickard Patience, MD  CC:  Acute visit.  Pt c/o increased SOB worsening since last Jan 2010.  Pt states that she gets out of breath walking to mailbox and doing pretty much any activity.  She also c/o dry cough at bedtime and tightness in her chest when lies down.  Marland Kitchen  History of Present Illness: 5 yowf never smoker eval in pulmonary clinic by Dr Reece Agar for sob (onset 2001) and rx advair and couldn't take it due to mouth irritation and cough but then completely better without any maint rx.  October 13, 2008 ov co 2 years doe x mailbox flat has to stop half way plus dry cough since mid oct 09 and some increaesed sob.  cough also at hs.  No excess sputum production.   Prev wu by Jayme Cloud and clance no response to advair, classic truncation of exp loop on limited spirometry 06/29/01  November 14, 2008--returns for follow up and med review. Cough is much better-90% resolved. Dyspnea  is improved. Still worn out with activity . No improvement with activity tolerance. Brought all meds to review.   December 02, 2008 ov following med calendar, maintaining much better doe and cough but not satisfied back to baseline with audible "wheeze" with activity.  rec reverse trial and f/u gi if flared  April 10, 2010 Acute visit.  Pt c/o progressively increased SOB worsening since last Jan 2010.  Pt states that she gets out of breath walking to mailbox and doing pretty much any activity.  She also c/o dry cough at bedtime and tightness in her chest when lies down.    primatene helps if takes before activity. no noct or early am exac.  Pt denies any significant sore throat, dysphagia, itching, sneezing,  nasal congestion or excess secretions,  fever, chills, sweats, unintended wt loss, pleuritic or exertional cp, hempoptysis, change in activity tolerance  orthopnea pnd or leg swelling Pt also denies any obvious  fluctuation in symptoms with weather or environmental change or other alleviating or aggravating factors.   not using ppi as rec on last ov.       Current Medications (verified): 1)  Multivitamins  Tabs (Multiple Vitamin) .... Once Daily 2)  Adult Aspirin Ec Low Strength 81 Mg  Tbec (Aspirin) .Marland Kitchen.. 1 Once Daily 3)  Nexium 40 Mg  Cpdr (Esomeprazole Magnesium) .... Take  One 30-60 Min Before First Meal of The Day 4)  Furosemide 80 Mg  Tabs (Furosemide) .... Take 2 Tablets Each Morning With Oj 5)  Alprazolam 1 Mg  Tabs (Alprazolam) .... Take 1 By Mouth  Three Times A Day As Needed Stress 6)  Diovan Hct 160-12.5 Mg  Tabs (Valsartan-Hydrochlorothiazide) .... One By Mouth Daily 7)  Celebrex 200 Mg Caps (Celecoxib) .... Take 1 Capsule By Mouth Two Times A Day As Needed For Arthritis 8)  Celexa 20 Mg Tabs (Citalopram Hydrobromide) .Marland Kitchen.. 1 By Mouth Once Daily  Allergies (verified): 1)  ! * Skelaxin 2)  ! Morphine  Past History:  Past Medical History: CAROTID ARTERY DISEASE (ICD-433.10) HYPERLIPIDEMIA (ICD-272.4) TIA (ICD-435.9) HYPERTENSION (ICD-401.9) URETHRITIS (ICD-597.80) SKIN LESIONS, MULTIPLE (ICD-709.9) LUMBAGO (ICD-724.2) DYSPNEA (ICD-786.09) ACUTE BRONCHITIS (ICD-466.0) COPD (ICD-496) ACUTE URIS OF UNSPECIFIED SITE (ICD-465.9) FECAL OCCULT BLOOD (ICD-792.1) DYSPHAGIA (ICD-787.29) HEMOCCULT POSITIVE STOOL (ICD-578.1) DEPRESSION (ICD-311) BACK PAIN, CHRONIC (ICD-724.5) RECTAL BLEEDING (ICD-569.3) SOB (ICD-786.05) COUGH (ICD-786.2) UTI (ICD-599.0) OSTEOPENIA (ICD-733.90) HYPOTHYROIDISM (ICD-244.9)  UNSPECIFIED ANEMIA (ICD-285.9) UNS ADVRS EFF OTH RX MEDICINAL&BIOLOGICAL SBSTNC (ICD-995.29) INTERNAL HEMORRHOIDS (ICD-455.0) ESOPHAGEAL STRICTURE (ICD-530.3) HIATAL HERNIA (ICD-553.3) OSTEOARTHRITIS (ICD-715.90) SPONDYLOSIS (ICD-721.90) GERD (ICD-530.81).........................................................................Marland KitchenMarina Goodell   - Pos EGD 06/18/07 with HH ANXIETY  (ICD-300.00) Mitral Regurgitation, mild    - LAE @ 42 mm 07/17/06 ECHO with mild AI also Hx of collapsed lung after  back surgery Unexplained doe   onset 2001     - neg ct chest 2001     - Classic vcd pattern exp loop  06/29/01     - PFT's December 02, 2008 :  FEV1 2.05(97%) ratio 73 no better with B2, DLC0 74%     - Walking sats nl April 10, 2010  Complex Med Regimen     -Meds reviewed with pt education and computerized med calendar completed/adjust. November 14, 2008  Vital Signs:  Patient profile:   75 year old female Weight:      185 pounds O2 Sat:      96 % on Room air Temp:     98.0 degrees F oral Pulse rate:   88 / minute BP sitting:   120 / 74  (left arm)  Vitals Entered By: Vernie Murders (April 10, 2010 11:55 AM)  O2 Flow:  Room air  Serial Vital Signs/Assessments:  Comments: 12:23 PM Ambulatory Pulse Oximetry  Resting; HR_94____    02 Sat__97%ra___  Lap1 (185 feet)   HR_121____   02 Sat__95%ra___ Lap2 (185 feet)   HR_____   02 Sat_____    Lap3 (185 feet)   HR_____   02 Sat_____  ___Test Completed without Difficulty __X_Test Stopped due to:PT OUT OF BREATH   By: Vernie Murders    Physical Exam  Additional Exam:  wt 169  in pulmonary clinic 2002 > 191 October 13, 2008 >>191 November 14, 2008 > 184 December 02, 2008 > 185 April 10, 2010  Ambulatory healthy appearing wf in no acute distress. HEENT: nl dentition, turbinates, and orophanx. Nl external ear canals without cough reflex Neck without JVD/Nodes/TM Lungs clear to A and P bilaterally ,  freq throat clearing with trace pseudowheeze resolves with purse lip maneuver  RRR no s3 or murmur or increase in P2 Abd soft and benign with nl excursion in the supine position. No bruits or organomegaly Ext warm without calf tenderness, cyanosis clubbing or edema Skin warm and dry without lesions     Sodium                    143 mEq/L                   135-145   Potassium                 4.1 mEq/L                    3.5-5.1   Chloride                  101 mEq/L                   96-112   Carbon Dioxide            32 mEq/L                    19-32   Glucose                   99 mg/dL  70-99   BUN                       17 mg/dL                    1-61   Creatinine                0.9 mg/dL                   0.9-6.0   Calcium                   9.7 mg/dL                   4.5-40.9   GFR                       66.22 mL/min                >60  Tests: (2) CBC Platelet w/Diff (CBCD)   White Cell Count          7.3 K/uL                    4.5-10.5   Red Cell Count            3.92 Mil/uL                 3.87-5.11   Hemoglobin                12.5 g/dL                   81.1-91.4   Hematocrit                36.2 %                      36.0-46.0   MCV                       92.6 fl                     78.0-100.0   MCHC                      34.5 g/dL                   78.2-95.6   RDW                       13.4 %                      11.5-14.6   Platelet Count            234.0 K/uL                  150.0-400.0   Neutrophil %              68.7 %                      43.0-77.0   Lymphocyte %              21.1 %                      12.0-46.0   Monocyte %  7.3 %                       3.0-12.0   Eosinophils%              2.1 %                       0.0-5.0   Basophils %               0.8 %                       0.0-3.0   Neutrophill Absolute      5.0 K/uL                    1.4-7.7   Lymphocyte Absolute       1.5 K/uL                    0.7-4.0   Monocyte Absolute         0.5 K/uL                    0.1-1.0  Eosinophils, Absolute                             0.2 K/uL                    0.0-0.7   Basophils Absolute        0.1 K/uL                    0.0-0.1  Tests: (3) TSH (TSH)   FastTSH                   1.66 uIU/mL                 0.35-5.50  Tests: (4) B-Type Natiuretic Peptide (BNPR)  B-Type Natriuetic Peptide                        [H]  288.9 pg/mL                  0.0-100.0  CXR  Procedure date:  04/10/2010  Findings:      Comparison: October 13, 2008   Findings: The cardiac silhouette, mediastinum, pulmonary vasculature are within normal limits.  Both lungs are clear. A hiatal hernia is noted.    There is no acute bony abnormality.   IMPRESSION: There is no evidence of acute cardiac or pulmonary process.  Impression & Recommendations:  Problem # 1:  SOB (ICD-786.05) No desaturation, no evidence of airflow obstruction on prev eval with classic vcd pattern on f/v loop are c/w vcd ? anxiety/ deconditioning ? acid reflux - note marked improvement at last eval but never returned well after regressed back to poor basline off aggressive gerd rx so for now will rechallenge with max gerd rx then go from there.   Each maintenance medication was reviewed in detail including most importantly the difference between maintenance and as needed and under what circumstances the prns are to be used. struggling again with concept of med reconciliation.  To keep things simple, I have asked the patient to first separate medicines that are perceived as maintenance, that is to be taken daily "no matter what", from those medicines that are taken on only on  an as-needed basis and I have given the patient examples of both, and then return to see our NP to generate a  detailed  medication calendar which should be followed until the next physician sees the patient and updates it.   Once we're sure that we're all reading from the same page in terms of medication admiistration, she needs to be scheduled to follow up with me   See instructions for specific recommendations   Medications Added to Medication List This Visit: 1)  Nexium 40 Mg Cpdr (Esomeprazole magnesium) .... Take one 30-60 min before first and last meals of the day 2)  Pepcid Ac Maximum Strength 20 Mg Tabs (Famotidine) .... One at bedtime 3)  Prednisone 10 Mg Tabs (Prednisone) .... 4 each am x 2days, 2x2days,  1x2days and stop  Other Orders: TLB-BMP (Basic Metabolic Panel-BMET) (80048-METABOL) TLB-CBC Platelet - w/Differential (85025-CBCD) TLB-TSH (Thyroid Stimulating Hormone) (84443-TSH) TLB-BNP (B-Natriuretic Peptide) (83880-BNPR) T-2 View CXR (71020TC) Est. Patient Level IV (04540) Pulse Oximetry (98119) Prescription Created Electronically [phone])  Patient Instructions: 1)  Change nexium to Take one 30-60 min before first and last meals of the day and pepcid 20 mg at bedtime 2)  Prednisone 4 each am x 2days, 2x2days, 1x2days and stop 3)  GERD (REFLUX)  is a common cause of respiratory symptoms. It commonly presents without heartburn and can be treated with medication, but also with lifestyle changes including avoidance of late meals, excessive alcohol, smoking cessation, and avoid fatty foods, chocolate, peppermint, colas, red wine, and acidic juices such as orange juice. NO MINT OR MENTHOL PRODUCTS SO NO COUGH DROPS  4)  USE SUGARLESS CANDY INSTEAD (jolley ranchers)  5)  NO OIL BASED VITAMINS  6)  See Tammy NP w/in 2 weeks with all your medications, even over the counter meds, separated in two separate bags, the ones you take no matter what vs the ones you stop once you feel better and take only as needed.  She will generate for you a new user friendly medication calendar that will put [place] all on the same page re: your medication use.  Prescriptions: PREDNISONE 10 MG  TABS (PREDNISONE) 4 each am x 2days, 2x2days, 1x2days and stop  #14 x 0   Entered and Authorized by:   Nyoka Cowden MD   Signed by:   Nyoka Cowden MD on 04/10/2010   Method used:   [facility]  [address] [phone] ([facility]ail[facility]       [address]       [place], [place]  [address]       Ph: [phone]       Fax: [phone]   RxID:   [id number]    CXR  Procedure date:  04/10/2010  Findings:      Comparison: October 13, 2008   Findings: The cardiac silhouette,  mediastinum, pulmonary vasculature are within normal limits.  Both lungs are clear. A hiatal hernia is noted.    There is no acute bony abnormality.   IMPRESSION: There is no evidence of acute cardiac or pulmonary process.

## 2010-12-04 NOTE — Letter (Signed)
Summary: Generic Letter  [place]  [address] [place], [place] [address]   Phone: [phone]  Fax: [phone]    02/28/2010  Kenzley Whidden [address] [place], [place]  [address]  Dear Ms. Divita,   Hemocult cards were all negative.         Sincerely,  Dr Gwenyth Bender Stafford,MD

## 2010-12-04 NOTE — Assessment & Plan Note (Signed)
Summary: NP follow up - med calendar   Copy to:  Dr. Berniece Andreas Primary Provider/Referring Provider:  Rickard Patience, MD  CC:  est med calendar - pt brought all meds with her today.  states breathins is unchanged sine last OV w/ SOB w/ activity and sweats.  also c/o episodes of shaking accompanied w/ HA x3 episodes - PCP is aware and but hasn't treated.Marland Kitchen  History of Present Illness: 75 yowf never smoker eval in pulmonary clinic by Dr Reece Agar for sob (onset 2001) and rx advair and couldn't take it due to mouth irritation and cough but then completely better without any maint rx.  October 13, 2008 ov co 2 years doe x mailbox flat has to stop half way plus dry cough since mid oct 09 and some increaesed sob.  cough also at hs.  No excess sputum production.   Prev wu by Jayme Cloud and clance no response to advair, classic truncation of exp loop on limited spirometry 06/29/01  November 14, 2008--returns for follow up and med review. Cough is much better-90% resolved. Dyspnea  is improved. Still worn out with activity . No improvement with activity tolerance. Brought all meds to review.   December 02, 2008 ov following med calendar, maintaining much better doe and cough but not satisfied back to baseline with audible "wheeze" with activity.  rec reverse trial and f/u gi if flared  April 10, 2010 Acute visit.  Pt c/o progressively increased SOB worsening since last Jan 2010.  Pt states that she gets out of breath walking to mailbox and doing pretty much any activity.  She also c/o dry cough at bedtime and tightness in her chest when lies down.    primatene helps if takes before activity. no noct or early am exac.    April 30, 2010--Presents for follow up and med review. We reviewed her meds and organized them into her med calendar. Had flare last visit w/ cough. Last visit given PPI and streoid taper. Felt better on steroids, cough got better. Cough is better but still happens not as bad.  She does occasionally  use primatene but work up showed nml lung fxn w/ no BD response. , xray last visit xray was neg. labs were unrevealing. Denies chest pain,  orthopnea, hemoptysis, fever, n/v/d, edema, headache.   Preventive Screening-Counseling & Management  Alcohol-Tobacco     Smoking Status: never  Medications Prior to Update: 1)  Multivitamins  Tabs (Multiple Vitamin) .... Once Daily 2)  Adult Aspirin Ec Low Strength 81 Mg  Tbec (Aspirin) .Marland Kitchen.. 1 Once Daily 3)  Nexium 40 Mg  Cpdr (Esomeprazole Magnesium) .... Take One 30-60 Min Before First and Last Meals of The Day 4)  Furosemide 80 Mg  Tabs (Furosemide) .... Take 2 Tablets Each Morning With Oj 5)  Alprazolam 1 Mg  Tabs (Alprazolam) .... Take 1 By Mouth  Three Times A Day As Needed Stress 6)  Diovan Hct 160-12.5 Mg  Tabs (Valsartan-Hydrochlorothiazide) .... One By Mouth Daily 7)  Celebrex 200 Mg Caps (Celecoxib) .... Take 1 Capsule By Mouth Two Times A Day As Needed For Arthritis 8)  Celexa 20 Mg Tabs (Citalopram Hydrobromide) .Marland Kitchen.. 1 By Mouth Once Daily 9)  Pepcid Ac Maximum Strength 20 Mg Tabs (Famotidine) .... One At Bedtime 10)  Prednisone 10 Mg  Tabs (Prednisone) .... 4 Each Am X 2days, 2x2days, 1x2days and Stop  Allergies (verified): 1)  ! * Skelaxin 2)  ! Morphine  Past History:  Past Surgical  History: Last updated: 01/09/2010 Total knee replacement Lumbar laminectomy-1990,1994,and 1999 Hysterectomy Tonsillectomy s/p post bladder tack -1991 hysterectomy,bladder surgery,spinal surgery times 3, tonsillectomy&appendectomy colonoscopy 2008.  Family History: Last updated: 08/01/2008 Family History of Heart Disease: Father Family History of Kidney Disease:Mother  Social History: Last updated: 10/13/2008 Occupation: Retired Patient has never smoked.  Alcohol Use - yes -occ.  Daily Caffeine Use -2 1/2 daily   Illicit Drug Use - no Patient does not get regular exercise.  Married  Risk Factors: Exercise: no (08/01/2008)  Risk  Factors: Smoking Status: never (04/30/2010)  Past Medical History: CAROTID ARTERY DISEASE (ICD-433.10) HYPERLIPIDEMIA (ICD-272.4) TIA (ICD-435.9) HYPERTENSION (ICD-401.9) URETHRITIS (ICD-597.80) SKIN LESIONS, MULTIPLE (ICD-709.9) LUMBAGO (ICD-724.2) DYSPNEA (ICD-786.09) ACUTE BRONCHITIS (ICD-466.0) COPD (ICD-496) ACUTE URIS OF UNSPECIFIED SITE (ICD-465.9) FECAL OCCULT BLOOD (ICD-792.1) DYSPHAGIA (ICD-787.29) HEMOCCULT POSITIVE STOOL (ICD-578.1) DEPRESSION (ICD-311) BACK PAIN, CHRONIC (ICD-724.5) RECTAL BLEEDING (ICD-569.3) SOB (ICD-786.05) COUGH (ICD-786.2) UTI (ICD-599.0) OSTEOPENIA (ICD-733.90) HYPOTHYROIDISM (ICD-244.9) UNSPECIFIED ANEMIA (ICD-285.9) UNS ADVRS EFF OTH RX MEDICINAL&BIOLOGICAL SBSTNC (ICD-995.29) INTERNAL HEMORRHOIDS (ICD-455.0) ESOPHAGEAL STRICTURE (ICD-530.3) HIATAL HERNIA (ICD-553.3) OSTEOARTHRITIS (ICD-715.90) SPONDYLOSIS (ICD-721.90) GERD (ICD-530.81).........................................................................Marland KitchenMarina Goodell   - Pos EGD 06/18/07 with HH ANXIETY (ICD-300.00) Mitral Regurgitation, mild    - LAE @ 42 mm 07/17/06 ECHO with mild AI also Hx of collapsed lung after  back surgery Unexplained doe   onset 2001     - neg ct chest 2001     - Classic vcd pattern exp loop  06/29/01     - PFT's December 02, 2008 :  FEV1 2.05(97%) ratio 73 no better with B2, DLC0 74%     - Walking sats nl April 10, 2010  Complex Med Regimen     -Meds reviewed with pt education and computerized med calendar completed/adjust. November 14, 2008, April 30, 2010   Review of Systems      See HPI  Vital Signs:  Patient profile:   75 year old female Height:      64 inches Weight:      182.31 pounds BMI:     31.41 O2 Sat:      96 % on Room air Temp:     97.6 degrees F oral Pulse rate:   75 / minute BP sitting:   126 / 84  (left arm) Cuff size:   regular  Vitals Entered By: Boone Master CNA/MA (April 30, 2010 2:10 PM)  O2 Flow:  Room air CC: est med calendar  - pt brought all meds with her today.  states breathins is unchanged sine last OV w/ SOB w/ activity and sweats.  also c/o episodes of shaking accompanied w/ HA x3 episodes - PCP is aware, but hasn't treated. Is Patient Diabetic? No Comments Medications reviewed with patient Daytime contact number verified with patient. Boone Master CNA/MA  April 30, 2010 2:07 PM    Physical Exam  Additional Exam:  wt 169  in pulmonary clinic 2002 > 191 October 13, 2008 >>191 November 14, 2008 > 184 December 02, 2008 > 185 April 10, 2010 >>182 April 30, 2010  Ambulatory healthy appearing wf in no acute distress. HEENT: nl dentition, turbinates, and orophanx. Nl external ear canals without cough reflex Neck without JVD/Nodes/TM Lungs clear to A and P bilaterally ,  trace pseudowheeze resolves with purse lip maneuver  RRR no s3 or murmur or increase in P2 Abd soft and benign with nl excursion in the supine position. No bruits or organomegaly Ext warm without calf tenderness, cyanosis clubbing or edema Skin warm and dry  without lesions     Impression & Recommendations:  Problem # 1:  COUGH (ICD-786.2)  Upper airway cough syndrome w/ neg work up so far  do not think she needs to take primatene mist- so advised to d/c  She is improved at present seems to be aggravated by GERD-esp nocturnal.  reviewed labs and xray w/ pt  Meds reviewed with pt education and computerized med calendar adjusted.   Orders: Est. Patient Level III ([address])  Problem # 2:       Complete Medication List: 1)  Multivitamins Tabs (Multiple vitamin) .... Once daily 2)  Adult Aspirin Ec Low Strength 81 Mg Tbec (Aspirin) .Marland Kitchen.. 1 once daily 3)  Nexium 40 Mg Cpdr (Esomeprazole magnesium) .... Take one 30-60 min before first and last meals of the day 4)  Furosemide 80 Mg Tabs (Furosemide) .... Take 2 tablets each morning with oj 5)  Alprazolam 1 Mg Tabs (Alprazolam) .... Take 1 by mouth  three times a day as needed stress 6)  Diovan Hct  160-12.5 Mg Tabs (Valsartan-hydrochlorothiazide) .... One by mouth daily 7)  Celebrex 200 Mg Caps (Celecoxib) .... Take 1 capsule by mouth two times a day as needed for arthritis 8)  Celexa 20 Mg Tabs (Citalopram hydrobromide) .Marland Kitchen.. 1 by mouth once daily 9)  Pepcid Ac Maximum Strength 20 Mg Tabs (Famotidine) .... One at bedtime 10)  Prednisone 10 Mg Tabs (Prednisone) .... 4 each am x 2days, 2x2days, 1x2days and stop  Patient Instructions: 1)  Stop Primatene Mist inhaler 2)  Follow med calendar closely and bring to each visit.  3)  follow up Dr. Sherene Sires in 3 months and as needed   Appended Document: med calendar update Medications Added CRESTOR 10 MG TABS (ROSUVASTATIN CALCIUM) Take 1 tab by mouth at bedtime BENZONATATE 100 MG CAPS (BENZONATATE) Take 1 capsule by mouth every 8 hours as needed TYLENOL 325 MG TABS (ACETAMINOPHEN) per bottle          Clinical Lists Changes  Medications: Added new medication of CRESTOR 10 MG TABS (ROSUVASTATIN CALCIUM) Take 1 tab by mouth at bedtime Added new medication of BENZONATATE 100 MG CAPS (BENZONATATE) Take 1 capsule by mouth every 8 hours as needed Added new medication of TYLENOL 325 MG TABS (ACETAMINOPHEN) per bottle

## 2010-12-04 NOTE — Progress Notes (Signed)
Summary: lab results  Phone Note Call from Patient   Caller: Patient Call For: Judithann Sheen MD Summary of Call: Pt would like lab results. [phone] Initial call taken by: Lynann Beaver CMA,  January 16, 2010 2:20 PM  Follow-up for Phone Call        to start crestor 10 mg qd

## 2010-12-04 NOTE — Progress Notes (Signed)
Summary: Alprazolam refill?  Phone Note Refill Request Message from:  Fax from Pharmacy on August 22, 2010 3:42 PM  Refills Requested: Medication #1:  ALPRAZOLAM 1 MG  TABS take 1 by mouth  three times a day as needed stress   Dosage confirmed as above?Dosage Confirmed Please advise refill?  Initial call taken by: Josph Macho RMA,  August 22, 2010 3:42 PM  Follow-up for Phone Call        OK to give with same sig, #90 and 1 rf Follow-up by: Danise Edge MD,  August 22, 2010 5:24 PM    Prescriptions: ALPRAZOLAM 1 MG  TABS (ALPRAZOLAM) take 1 by mouth  three times a day as needed stress  #90 x 1   Entered by:   Josph Macho RMA   Authorized by:   Danise Edge MD   Signed by:   Josph Macho RMA on 08/23/2010   Method used:   [facility] ...       [facility]  [address] [id number]       [address]       [place], [place]  [address]       Ph: [phone]       Fax: [phone]   RxID:   [id number]

## 2010-12-04 NOTE — Progress Notes (Signed)
Summary: generic nexium  Phone Note Call from Patient   Caller: Patient Call For: Judithann Sheen MD Summary of Call: [place] and [place] more [facility]  [phone] Had Carotid Artery Doppler that was normal. Initial call taken by: Lynann Beaver CMA,  May 01, 2010 12:47 PM  Follow-up for Phone Call        ok per dr Scotty Court.  Follow-up by: Pura Spice, RN,  May 01, 2010 1:22 PM    Prescriptions: NEXIUM 40 MG  CPDR (ESOMEPRAZOLE MAGNESIUM) Take one 30-60 min before first and last meals of the day  #60 x 6   Entered by:   Pura Spice, RN   Authorized by:   Judithann Sheen MD   Signed by:   Pura Spice, RN on 05/01/2010   Method used:   [facility]        [facility]  [address] [id number]       [address]       [place], [place]  [address]       Ph: [phone]       Fax: [phone]   RxID:   [id number]

## 2010-12-04 NOTE — Miscellaneous (Signed)
Summary: Outpatient Coinsurance Notice  Outpatient Coinsurance Notice   Imported By: Marylou Mccoy 01/31/2010 16:22:30  _____________________________________________________________________  External Attachment:    Type:   Image     Comment:   External Document

## 2010-12-04 NOTE — Progress Notes (Signed)
Summary: [facility] [place]  Phone Note Call from Patient   Caller: Patient Call For: voice mail [place]: [phone] [place]  Initial call taken by: Pura Spice, RN,  November 16, 2009 3:08 PM  Follow-up for Phone Call        pt notified and mess left to call refill ?  [facility] [place] at  [phone] Follow-up by: Pura Spice, RN,  November 16, 2009 3:08 PM  Additional Follow-up for Phone Call Additional follow up Details #1::        [facility] spoke to paula and will refill diovan mess left on pt number at  [phone] Additional Follow-up by: Pura Spice, RN,  November 16, 2009 5:01 PM

## 2010-12-04 NOTE — Assessment & Plan Note (Signed)
Summary: ec3/ gd      Allergies Added:   Referring Provider:  Dr. Berniece Andreas Primary Provider:  Rickard Patience, MD   History of Present Illness: Julie Rice is seen today at the request of Dr Scotty Court   I tak care  of her husband, Randon Goldsmith, and she was re-referred for lower extremity edema.  She has hadatypical chest pain and shortness of breath in the past.  She has had normalLV function, a negative cath with no evidence of coronary disease, and I believe her last echo showed no evidence of LV or RV dysfunction, noevidence of pulmonary hypertension.  She had mild MR and mild aorticinsufficiency.   She describes more SOB lately.  It actually sounds like she may have chronic reflux with previous esophageal dilatations, and possible aspiration as her symptoms are worse in recumbancy at night.  She does have "mild" COPD by spirometry according to her.  Current Problems (verified): 1)  Carotid Artery Disease  (ICD-433.10) 2)  Hyperlipidemia  (ICD-272.4) 3)  Tia  (ICD-435.9) 4)  Hypertension  (ICD-401.9) 5)  Urethritis  (ICD-597.80) 6)  Skin Lesions, Multiple  (ICD-709.9) 7)  Lumbago  (ICD-724.2) 8)  Dyspnea  (ICD-786.09) 9)  Acute Bronchitis  (ICD-466.0) 10)  COPD  (ICD-496) 11)  Acute Uris of Unspecified Site  (ICD-465.9) 12)  Fecal Occult Blood  (ICD-792.1) 13)  Dysphagia  (ICD-787.29) 14)  Hemoccult Positive Stool  (ICD-578.1) 15)  Depression  (ICD-311) 16)  Back Pain, Chronic  (ICD-724.5) 17)  Rectal Bleeding  (ICD-569.3) 18)  Sob  (ICD-786.05) 19)  Cough  (ICD-786.2) 20)  Uti  (ICD-599.0) 21)  Osteopenia  (ICD-733.90) 22)  Hypothyroidism  (ICD-244.9) 23)  Unspecified Anemia  (ICD-285.9) 24)  Uns Advrs Eff Oth Rx Medicinal&biological Sbstnc  (ICD-995.29) 25)  Internal Hemorrhoids  (ICD-455.0) 26)  Esophageal Stricture  (ICD-530.3) 27)  Hiatal Hernia  (ICD-553.3) 28)  Osteoarthritis  (ICD-715.90) 29)  Spondylosis  (ICD-721.90) 30)  Gerd  (ICD-530.81) 31)  Anxiety   (ICD-300.00)  Current Medications (verified): 1)  Multivitamins  Tabs (Multiple Vitamin) .... Once Daily 2)  Adult Aspirin Ec Low Strength 81 Mg  Tbec (Aspirin) .Marland Kitchen.. 1 Once Daily 3)  Nexium 40 Mg  Cpdr (Esomeprazole Magnesium) .... Take  One 30-60 Min Before First Meal of The Day 4)  Furosemide 80 Mg  Tabs (Furosemide) .... Take 2 Tablets Each Morning With Oj 5)  Alprazolam 1 Mg  Tabs (Alprazolam) .... Take 1 By Mouth  Three Times A Day As Needed Stress 6)  Diovan Hct 160-12.5 Mg  Tabs (Valsartan-Hydrochlorothiazide) .... One By Mouth Daily 7)  Celebrex 200 Mg Caps (Celecoxib) .... Take 1 Capsule By Mouth Two Times A Day As Needed For Arthritis 8)  Celexa 20 Mg Tabs (Citalopram Hydrobromide) .Marland Kitchen.. 1 By Mouth Once Daily  Allergies (verified): 1)  ! * Skelaxin 2)  ! Morphine  Past History:  Past Medical History: Last updated: 01/09/2010 Current Problems:  CAROTID ARTERY DISEASE (ICD-433.10) HYPERLIPIDEMIA (ICD-272.4) TIA (ICD-435.9) HYPERTENSION (ICD-401.9) URETHRITIS (ICD-597.80) SKIN LESIONS, MULTIPLE (ICD-709.9) LUMBAGO (ICD-724.2) DYSPNEA (ICD-786.09) ACUTE BRONCHITIS (ICD-466.0) COPD (ICD-496) ACUTE URIS OF UNSPECIFIED SITE (ICD-465.9) FECAL OCCULT BLOOD (ICD-792.1) DYSPHAGIA (ICD-787.29) HEMOCCULT POSITIVE STOOL (ICD-578.1) DEPRESSION (ICD-311) BACK PAIN, CHRONIC (ICD-724.5) RECTAL BLEEDING (ICD-569.3) SOB (ICD-786.05) COUGH (ICD-786.2) UTI (ICD-599.0) OSTEOPENIA (ICD-733.90) HYPOTHYROIDISM (ICD-244.9) UNSPECIFIED ANEMIA (ICD-285.9) UNS ADVRS EFF OTH RX MEDICINAL&BIOLOGICAL SBSTNC (ICD-995.29) INTERNAL HEMORRHOIDS (ICD-455.0) ESOPHAGEAL STRICTURE (ICD-530.3) HIATAL HERNIA (ICD-553.3) OSTEOARTHRITIS (ICD-715.90) SPONDYLOSIS (ICD-721.90) GERD (ICD-530.81).........................................................................Marland KitchenPerry   - Pos EGD 06/18/07 with HH ANXIETY (  ICD-300.00) Mitral Regurgitation, mild    - LAE @ 42 mm 07/17/06 ECHO with mild AI also Hx of  collapsed lung after  back surgery Unexplained doe   onset 2001     - neg ct chest 2001     - Classic vcd pattern exp loop  06/29/01     - PFT's December 02, 2008 :  FEV1 2.05(97%) ratio 73 no better with B2, DLC0 74% Complex Med Regimen     -Meds reviewed with pt education and computerized med calendar completed/adjust. November 14, 2008  Past Surgical History: Last updated: 01/09/2010 Total knee replacement Lumbar laminectomy-1990,1994,and 1999 Hysterectomy Tonsillectomy s/p post bladder tack -1991 hysterectomy,bladder surgery,spinal surgery times 3, tonsillectomy&appendectomy colonoscopy 2008.  Family History: Last updated: 08/01/2008 Family History of Heart Disease: Father Family History of Kidney Disease:Mother  Social History: Last updated: 10/13/2008 Occupation: Retired Patient has never smoked.  Alcohol Use - yes -occ.  Daily Caffeine Use -2 1/2 daily   Illicit Drug Use - no Patient does not get regular exercise.  Married  Review of Systems       Denies fever, malais, weight loss, blurry vision, decreased visual acuity, cough, sputum, SOB, hemoptysis, pleuritic pain, palpitaitons, heartburn, abdominal pain, melena, lower extremity edema, claudication, or rash.   Vital Signs:  Patient profile:   75 year old female Height:      64 inches Weight:      184 pounds BMI:     31.70 Resp:     12 per minute BP sitting:   118 / 78  (left arm)  Vitals Entered By: Kem Parkinson (January 10, 2010 4:08 PM)  Physical Exam  General:  Affect appropriate Healthy:  appears stated age HEENT: normal Neck supple with no adenopathy JVP normal no bruits no thyromegaly Lungs clear with no wheezing and good diaphragmatic motion Heart:  S1/S2 no murmur,rub, gallop or click PMI normal Abdomen: benighn, BS positve, no tenderness, no AAA no bruit.  No HSM or HJR Distal pulses intact with no bruits No edema Neuro non-focal Skin warm and dry    Impression &  Recommendations:  Problem # 1:  HYPERTENSION (ICD-401.9) Well controlled Her updated medication list for this problem includes:    Adult Aspirin Ec Low Strength 81 Mg Tbec (Aspirin) .Marland Kitchen... 1 once daily    Furosemide 80 Mg Tabs (Furosemide) .Marland Kitchen... Take 2 tablets each morning with oj    Diovan Hct 160-12.5 Mg Tabs (Valsartan-hydrochlorothiazide) ..... One by mouth daily  Problem # 2:  DYSPNEA (ICD-786.09) Not likely cardiac.  Likely chronic aspiration.  F/U [facility] and pulmonary.  Check echo to make sure valve disease not worse, check EF and assess PA pressure Her updated medication list for this problem includes:    Adult Aspirin Ec Low Strength 81 Mg Tbec (Aspirin) .Marland Kitchen... 1 once daily    Furosemide 80 Mg Tabs (Furosemide) .Marland Kitchen... Take 2 tablets each morning with oj    Diovan Hct 160-12.5 Mg Tabs (Valsartan-hydrochlorothiazide) ..... One by mouth daily  Problem # 3:  HYPERLIPIDEMIA (ICD-272.4) Assessment: Improved Continue statin and diet Rx.  LFT's normal The following medications were removed from the medication list:    Lipitor 40 Mg Tabs (Atorvastatin calcium) .Marland Kitchen... 1 by mouth once daily  CHOL: 251 (07/06/2009)   LDL: DEL (08/16/2008)   HDL: 46.30 (07/06/2009)   TG: 236.0 (07/06/2009)  Other Orders: Echocardiogram (Echo)  Patient Instructions: 1)  Your physician recommends that you schedule a follow-up appointment in: AS NEEDED PENDING TEST RESULTS  2)  Your physician has requested that you have an echocardiogram.  Echocardiography is a painless test that uses sound waves to create images of your heart. It provides your doctor with information about the size and shape of your heart and how well your heart's chambers and valves are working.  This procedure takes approximately one hour. There are no restrictions for this procedure.   EKG Report  Procedure date:  01/10/2010  Findings:      NSR 72 Normal ECG  Appended Document: ec3/ gd to start Crestor 10 mg qd

## 2010-12-04 NOTE — Progress Notes (Signed)
Summary: rx alprazolam w 5 refills   Phone Note From Pharmacy   [facility] [id number] Reason for Call: Needs renewal Summary of Call: refill alprazolam  Initial call taken by: Pura Spice, RN,  January 09, 2010 11:16 AM  Follow-up for Phone Call        ok x 3  Follow-up by: Pura Spice, RN,  January 09, 2010 11:17 AM    New/Updated Medications: ALPRAZOLAM 1 MG  TABS (ALPRAZOLAM) take 1 by mouth  three times a day as needed stress Prescriptions: ALPRAZOLAM 1 MG  TABS (ALPRAZOLAM) take 1 by mouth  three times a day as needed stress  #90 x 5   Entered by:   Pura Spice, RN   Authorized by:   Judithann Sheen MD   Signed by:   Pura Spice, RN on 01/09/2010   Method used:   Telephoned to ...       [facility] [address] [id number]       [address]       [place], [place]  [address]       Ph: [phone]       Fax: [phone]   RxID:   [id number]

## 2010-12-06 NOTE — Assessment & Plan Note (Signed)
Summary: painful knot on neck/njr   Vital Signs:  Patient profile:   75 year old female Weight:      185 pounds Pulse rate:   66 / minute BP sitting:   124 / 88  (right arm)  Vitals Entered By: Kyung Rudd, CMA (October 31, 2010 11:12 AM) CC: painful knot on rt side of neck x 6wks   Primary Care Provider:  Rickard Patience, MD  CC:  painful knot on rt side of neck x 6wks.  History of Present Illness: Patient presents to clinic as a workin for evaluation of knot on neck. Notes 6wk h/o left posterior neck ST mass. Initially size of marble and tender. Notes has increased in size and now has increased in pain. Tylenol and heating pad help. Denies f/c, recent illness, cough, ear pain/drainage or ST. No injury/trauma to neck and no radicular pain to arms.  Current Medications (verified): 1)  Multivitamins  Tabs (Multiple Vitamin) .... Once Daily 2)  Adult Aspirin Ec Low Strength 81 Mg  Tbec (Aspirin) .Marland Kitchen.. 1 Once Daily 3)  Nexium 40 Mg  Cpdr (Esomeprazole Magnesium) .... Take One 30-60 Min Before First and Last Meals of The Day 4)  Furosemide 80 Mg  Tabs (Furosemide) .... Take 2 Tablets Each Morning With Oj 5)  Celexa 20 Mg Tabs (Citalopram Hydrobromide) .Marland Kitchen.. 1 By Mouth Once Daily 6)  Diovan Hct 160-12.5 Mg  Tabs (Valsartan-Hydrochlorothiazide) .... One By Mouth Daily 7)  Pepcid Ac Maximum Strength 20 Mg Tabs (Famotidine) .... One At Bedtime 8)  Celebrex 200 Mg Caps (Celecoxib) .... Take 1 Capsule By Mouth Two Times A Day As Needed For Arthritis 9)  Benzonatate 100 Mg Caps (Benzonatate) .... Take 1 Capsule By Mouth Every 8 Hours As Needed 10)  Alprazolam 1 Mg  Tabs (Alprazolam) .... Take 1 By Mouth  Three Times A Day As Needed Stress 11)  Tylenol 325 Mg Tabs (Acetaminophen) .... Per Bottle  Allergies (verified): 1)  ! * Skelaxin 2)  ! Morphine  Past History:  Past medical, surgical, family and social histories (including risk factors) reviewed for relevance to current acute and  chronic problems.  Past Medical History: Reviewed history from 04/30/2010 and no changes required. CAROTID ARTERY DISEASE (ICD-433.10) HYPERLIPIDEMIA (ICD-272.4) TIA (ICD-435.9) HYPERTENSION (ICD-401.9) URETHRITIS (ICD-597.80) SKIN LESIONS, MULTIPLE (ICD-709.9) LUMBAGO (ICD-724.2) DYSPNEA (ICD-786.09) ACUTE BRONCHITIS (ICD-466.0) COPD (ICD-496) ACUTE URIS OF UNSPECIFIED SITE (ICD-465.9) FECAL OCCULT BLOOD (ICD-792.1) DYSPHAGIA (ICD-787.29) HEMOCCULT POSITIVE STOOL (ICD-578.1) DEPRESSION (ICD-311) BACK PAIN, CHRONIC (ICD-724.5) RECTAL BLEEDING (ICD-569.3) SOB (ICD-786.05) COUGH (ICD-786.2) UTI (ICD-599.0) OSTEOPENIA (ICD-733.90) HYPOTHYROIDISM (ICD-244.9) UNSPECIFIED ANEMIA (ICD-285.9) UNS ADVRS EFF OTH RX MEDICINAL&BIOLOGICAL SBSTNC (ICD-995.29) INTERNAL HEMORRHOIDS (ICD-455.0) ESOPHAGEAL STRICTURE (ICD-530.3) HIATAL HERNIA (ICD-553.3) OSTEOARTHRITIS (ICD-715.90) SPONDYLOSIS (ICD-721.90) GERD (ICD-530.81).........................................................................Marland KitchenMarina Goodell   - Pos EGD 06/18/07 with HH ANXIETY (ICD-300.00) Mitral Regurgitation, mild    - LAE @ 42 mm 07/17/06 ECHO with mild AI also Hx of collapsed lung after  back surgery Unexplained doe   onset 2001     - neg ct chest 2001     - Classic vcd pattern exp loop  06/29/01     - PFT's December 02, 2008 :  FEV1 2.05(97%) ratio 73 no better with B2, DLC0 74%     - Walking sats nl April 10, 2010  Complex Med Regimen     -Meds reviewed with pt education and computerized med calendar completed/adjust. November 14, 2008, April 30, 2010   Past Surgical History: Reviewed history from 01/09/2010 and no changes required. Total  knee replacement Lumbar laminectomy-1990,1994,and 1999 Hysterectomy Tonsillectomy s/p post bladder tack -1991 hysterectomy,bladder surgery,spinal surgery times 3, tonsillectomy&appendectomy colonoscopy 2008.  Family History: Reviewed history from 08/01/2008 and no changes  required. Family History of Heart Disease: Father Family History of Kidney Disease:Mother  Social History: Reviewed history from 10/13/2008 and no changes required. Occupation: Retired Patient has never smoked.  Alcohol Use - yes -occ.  Daily Caffeine Use -2 1/2 daily   Illicit Drug Use - no Patient does not get regular exercise.  Married  Review of Systems      See HPI General:  Denies chills, fatigue, fever, malaise, sweats, weakness, and weight loss. Eyes:  Denies discharge, eye irritation, and red eye. ENT:  Denies ear discharge, earache, hoarseness, nasal congestion, postnasal drainage, and sore throat. Derm:  Denies changes in color of skin, flushing, and rash. Neuro:  Denies headaches, numbness, and weakness.  Physical Exam  General:  Well-developed,well-nourished,in no acute distress; alert,appropriate and cooperative throughout examination Head:  Normocephalic and atraumatic without obvious abnormalities. No apparent alopecia or balding. Eyes:  pupils equal, pupils round, corneas and lenses clear, and no injection.   Ears:  External ear exam shows no significant lesions or deformities.  Otoscopic examination reveals clear canals, tympanic membranes are intact bilaterally without bulging, retraction, inflammation or discharge. Hearing is grossly normal bilaterally. Nose:  External nasal examination shows no deformity or inflammation. Nasal mucosa are pink and moist without lesions or exudates. Mouth:  Oral mucosa and oropharynx without lesions or exudates.  Teeth in good repair. Neck:  supple and full ROM.  ?small ST mass tender left posterior neck. difficult to assess size ?<3/4cm and mobile.    Impression & Recommendations:  Problem # 1:  CERVICAL LYMPHADENOPATHY (ICD-785.6) Assessment New Suspect possible enlarged LN. Begin abx course with augmentin. Obtain CBC. Hydrocodone as needed pain. Cautioned regarding possible sedating effect. Recommend close f/u appt at  conclusion of abx to reassess for need of neck CT or ENT consult. Pt states is leaving town for several months. Agrees to monitor area closely and f/u in clinic if no resolution with abx.  Her updated medication list for this problem includes:    Augmentin 875-125 Mg Tabs (Amoxicillin-pot clavulanate) ..... One by mouth bid  Orders: Specimen Handling (16109) Venipuncture (60454) TLB-CBC Platelet - w/Differential (85025-CBCD)  Complete Medication List: 1)  Multivitamins Tabs (Multiple vitamin) .... Once daily 2)  Adult Aspirin Ec Low Strength 81 Mg Tbec (Aspirin) .Marland Kitchen.. 1 once daily 3)  Nexium 40 Mg Cpdr (Esomeprazole magnesium) .... Take one 30-60 min before first and last meals of the day 4)  Furosemide 80 Mg Tabs (Furosemide) .... Take 2 tablets each morning with oj 5)  Celexa 20 Mg Tabs (Citalopram hydrobromide) .Marland Kitchen.. 1 by mouth once daily 6)  Diovan Hct 160-12.5 Mg Tabs (Valsartan-hydrochlorothiazide) .... One by mouth daily 7)  Pepcid Ac Maximum Strength 20 Mg Tabs (Famotidine) .... One at bedtime 8)  Celebrex 200 Mg Caps (Celecoxib) .... Take 1 capsule by mouth two times a day as needed for arthritis 9)  Benzonatate 100 Mg Caps (Benzonatate) .... Take 1 capsule by mouth every 8 hours as needed 10)  Alprazolam 1 Mg Tabs (Alprazolam) .... Take 1 by mouth  three times a day as needed stress 11)  Tylenol 325 Mg Tabs (Acetaminophen) .... Per bottle 12)  Lortab 5-500 Mg Tabs (Hydrocodone-acetaminophen) .... One by mouth q6 hours as needed pain 13)  Augmentin 875-125 Mg Tabs (Amoxicillin-pot clavulanate) .... One by mouth bid Prescriptions:  AUGMENTIN 875-125 MG TABS (AMOXICILLIN-POT CLAVULANATE) one by mouth bid  #20 x 0   Entered and Authorized by:   Edwyna Perfect MD   Signed by:   Edwyna Perfect MD on 10/31/2010   Method used:   Print then Give to Patient   RxID:   [id number] LORTAB 5-500 MG TABS (HYDROCODONE-ACETAMINOPHEN) one by mouth q6 hours as needed pain  #20 x 0   Entered  and Authorized by:   Edwyna Perfect MD   Signed by:   Edwyna Perfect MD on 10/31/2010   Method used:   Print then Give to Patient   RxID:   [id number]    Orders Added: 1)  Specimen Handling [99000] 2)  Venipuncture [10272] 3)  TLB-CBC Platelet - w/Differential [85025-CBCD] 4)  Est. Patient Level IV [53664]

## 2010-12-06 NOTE — Assessment & Plan Note (Signed)
Summary: fup//ccm   Vital Signs:  Patient profile:   75 year old female Weight:      185 pounds Pulse rate:   70 / minute Pulse rhythm:   regular BP sitting:   122 / 86  (left arm) Cuff size:   regular  Vitals Entered By: Kyung Rudd, CMA (November 15, 2010 3:35 PM) CC: F/U from lump on neck...pt notes another one below the previous one now   Primary Care Provider:  Rickard Patience, MD  CC:  F/U from lump on neck...pt notes another one below the previous one now.  History of Present Illness: Patient presents to clinic as a workin for evaluation of neck st mass. Seen 12/28 with 6wk h/o right ant neck st mass ?enlarged LN. Attempted course of augmentin without improvement. Now believes area is larger than before. Also notes left supraclavicular area enlarged without pain. No f/c or unintended wt loss.  Current Medications (verified): 1)  Multivitamins  Tabs (Multiple Vitamin) .... Once Daily 2)  Adult Aspirin Ec Low Strength 81 Mg  Tbec (Aspirin) .Marland Kitchen.. 1 Once Daily 3)  Nexium 40 Mg  Cpdr (Esomeprazole Magnesium) .... Take One 30-60 Min Before First and Last Meals of The Day 4)  Furosemide 80 Mg  Tabs (Furosemide) .... Take 2 Tablets Each Morning With Oj 5)  Celexa 20 Mg Tabs (Citalopram Hydrobromide) .Marland Kitchen.. 1 By Mouth Once Daily 6)  Diovan Hct 160-12.5 Mg  Tabs (Valsartan-Hydrochlorothiazide) .... One By Mouth Daily 7)  Pepcid Ac Maximum Strength 20 Mg Tabs (Famotidine) .... One At Bedtime 8)  Celebrex 200 Mg Caps (Celecoxib) .... Take 1 Capsule By Mouth Two Times A Day As Needed For Arthritis 9)  Benzonatate 100 Mg Caps (Benzonatate) .... Take 1 Capsule By Mouth Every 8 Hours As Needed 10)  Alprazolam 1 Mg  Tabs (Alprazolam) .... Take 1 By Mouth  Three Times A Day As Needed Stress 11)  Tylenol 325 Mg Tabs (Acetaminophen) .... Per Bottle 12)  Lortab 5-500 Mg Tabs (Hydrocodone-Acetaminophen) .... One By Mouth Q6 Hours As Needed Pain  Allergies (verified): 1)  ! * Skelaxin 2)  !  Morphine  Past History:  Past medical, surgical, family and social histories (including risk factors) reviewed, and no changes noted (except as noted below).  Past Medical History: Reviewed history from 04/30/2010 and no changes required. CAROTID ARTERY DISEASE (ICD-433.10) HYPERLIPIDEMIA (ICD-272.4) TIA (ICD-435.9) HYPERTENSION (ICD-401.9) URETHRITIS (ICD-597.80) SKIN LESIONS, MULTIPLE (ICD-709.9) LUMBAGO (ICD-724.2) DYSPNEA (ICD-786.09) ACUTE BRONCHITIS (ICD-466.0) COPD (ICD-496) ACUTE URIS OF UNSPECIFIED SITE (ICD-465.9) FECAL OCCULT BLOOD (ICD-792.1) DYSPHAGIA (ICD-787.29) HEMOCCULT POSITIVE STOOL (ICD-578.1) DEPRESSION (ICD-311) BACK PAIN, CHRONIC (ICD-724.5) RECTAL BLEEDING (ICD-569.3) SOB (ICD-786.05) COUGH (ICD-786.2) UTI (ICD-599.0) OSTEOPENIA (ICD-733.90) HYPOTHYROIDISM (ICD-244.9) UNSPECIFIED ANEMIA (ICD-285.9) UNS ADVRS EFF OTH RX MEDICINAL&BIOLOGICAL SBSTNC (ICD-995.29) INTERNAL HEMORRHOIDS (ICD-455.0) ESOPHAGEAL STRICTURE (ICD-530.3) HIATAL HERNIA (ICD-553.3) OSTEOARTHRITIS (ICD-715.90) SPONDYLOSIS (ICD-721.90) GERD (ICD-530.81).........................................................................Marland KitchenMarina Goodell   - Pos EGD 06/18/07 with HH ANXIETY (ICD-300.00) Mitral Regurgitation, mild    - LAE @ 42 mm 07/17/06 ECHO with mild AI also Hx of collapsed lung after  back surgery Unexplained doe   onset 2001     - neg ct chest 2001     - Classic vcd pattern exp loop  06/29/01     - PFT's December 02, 2008 :  FEV1 2.05(97%) ratio 73 no better with B2, DLC0 74%     - Walking sats nl April 10, 2010  Complex Med Regimen     -Meds reviewed with pt education and computerized med calendar  completed/adjust. November 14, 2008, April 30, 2010   Past Surgical History: Reviewed history from 01/09/2010 and no changes required. Total knee replacement Lumbar laminectomy-1990,1994,and 1999 Hysterectomy Tonsillectomy s/p post bladder tack -1991 hysterectomy,bladder  surgery,spinal surgery times 3, tonsillectomy&appendectomy colonoscopy 2008.  Family History: Reviewed history from 08/01/2008 and no changes required. Family History of Heart Disease: Father Family History of Kidney Disease:Mother  Social History: Reviewed history from 10/13/2008 and no changes required. Occupation: Retired Patient has never smoked.  Alcohol Use - yes -occ.  Daily Caffeine Use -2 1/2 daily   Illicit Drug Use - no Patient does not get regular exercise.  Married  Review of Systems General:  Denies chills and fever. Eyes:  Denies eye irritation, eye pain, and red eye. Resp:  Denies cough, coughing up blood, and shortness of breath. Derm:  Denies changes in color of skin, flushing, and rash.  Physical Exam  General:  Well-developed,well-nourished,in no acute distress; alert,appropriate and cooperative throughout examination Head:  Normocephalic and atraumatic without obvious abnormalities. No apparent alopecia or balding. Eyes:  pupils equal, pupils round, and corneas and lenses clear.   Ears:  no external deformities.   Nose:  no external deformity.   Neck:  +tenderness right upper ant neck ?enlarged ln. left supraclavicular area enlarged ?adipose tissue Skin:  turgor normal, color normal, and no rashes.     Impression & Recommendations:  Problem # 1:  CERVICAL LYMPHADENOPATHY (ICD-785.6) Assessment Deteriorated Failed to improve with empiric abx. Obtain CXR. Schedule ENT consult. Orders: ENT Referral (ENT) T-2 View CXR ([id number])  Complete Medication List: 1)  Multivitamins Tabs (Multiple vitamin) .... Once daily 2)  Adult Aspirin Ec Low Strength 81 Mg Tbec (Aspirin) .Marland Kitchen.. 1 once daily 3)  Nexium 40 Mg Cpdr (Esomeprazole magnesium) .... Take one 30-60 min before first and last meals of the day 4)  Furosemide 80 Mg Tabs (Furosemide) .... Take 2 tablets each morning with oj 5)  Celexa 20 Mg Tabs (Citalopram hydrobromide) .Marland Kitchen.. 1 by mouth once daily 6)   Diovan Hct 160-12.5 Mg Tabs (Valsartan-hydrochlorothiazide) .... One by mouth daily 7)  Pepcid Ac Maximum Strength 20 Mg Tabs (Famotidine) .... One at bedtime 8)  Celebrex 200 Mg Caps (Celecoxib) .... Take 1 capsule by mouth two times a day as needed for arthritis 9)  Benzonatate 100 Mg Caps (Benzonatate) .... Take 1 capsule by mouth every 8 hours as needed 10)  Alprazolam 1 Mg Tabs (Alprazolam) .... Take 1 by mouth  three times a day as needed stress 11)  Tylenol 325 Mg Tabs (Acetaminophen) .... Per bottle 12)  Lortab 5-500 Mg Tabs (Hydrocodone-acetaminophen) .... One by mouth q6 hours as needed pain   Orders Added: 1)  ENT Referral [ENT] 2)  T-2 View CXR [[id number]] 3)  Est. Patient Level III [16109]

## 2010-12-06 NOTE — Progress Notes (Signed)
----   Converted from flag ---- ---- 11/16/2010 8:03 AM, Edwyna Perfect MD wrote: cxr nl ------------------------------  Phone Note Outgoing Call   Call placed by: Kyung Rudd, CMA,  November 16, 2010 8:36 AM Call placed to: Patient Details for Reason: results Summary of Call: left messgae to call back Initial call taken by: Kyung Rudd, CMA,  November 16, 2010 8:36 AM  Follow-up for Phone Call        pt aware Follow-up by: Kyung Rudd, CMA,  November 16, 2010 12:18 PM

## 2010-12-06 NOTE — Progress Notes (Signed)
----   Converted from flag ---- ---- 11/01/2010 12:38 PM, Edwyna Perfect MD wrote: notify cbc nl. ("blood count" normal) ------------------------------ pt aware

## 2010-12-12 NOTE — Consult Note (Signed)
Summary: ENT-Dr. Narda Bonds  ENT-Dr. Narda Bonds   Imported By: Maryln Gottron 12/04/2010 14:01:24  _____________________________________________________________________  External Attachment:    Type:   Image     Comment:   External Document

## 2011-01-01 ENCOUNTER — Telehealth: Payer: Self-pay | Admitting: Family Medicine

## 2011-01-01 NOTE — Telephone Encounter (Signed)
Pt needs refill on diovan hct 160-12mg , celexa 20mg , furosemide 80 mg and alprazolam 1mg   [phone]

## 2011-01-08 NOTE — Telephone Encounter (Signed)
[place]

## 2011-01-09 ENCOUNTER — Telehealth: Payer: Self-pay | Admitting: Family Medicine

## 2011-01-09 NOTE — Telephone Encounter (Signed)
Triage vm---------3 meds were called in by Dr Scotty Court for: Diovan, Celexa, Lasix and Xanax. Need exact strengths, directions and quantities. Please return call.

## 2011-01-22 ENCOUNTER — Other Ambulatory Visit: Payer: Self-pay | Admitting: Family Medicine

## 2011-01-22 MED ORDER — VALSARTAN-HYDROCHLOROTHIAZIDE 160-12.5 MG PO TABS: 1.0000 | ORAL_TABLET | Freq: Every day | ORAL | Status: DC

## 2011-01-22 MED ORDER — ALPRAZOLAM 1 MG PO TABS: 1.0000 mg | ORAL_TABLET | Freq: Three times a day (TID) | ORAL | Status: DC | PRN

## 2011-01-22 MED ORDER — CELECOXIB 200 MG PO CAPS: 200.0000 mg | ORAL_CAPSULE | Freq: Two times a day (BID) | ORAL | Status: DC

## 2011-01-22 MED ORDER — CITALOPRAM HYDROBROMIDE 40 MG PO TABS: 40.0000 mg | ORAL_TABLET | Freq: Every day | ORAL | Status: DC

## 2011-01-22 MED ORDER — FUROSEMIDE 80 MG PO TABS: 80.0000 mg | ORAL_TABLET | Freq: Two times a day (BID) | ORAL | Status: DC

## 2011-01-22 MED ORDER — ESOMEPRAZOLE MAGNESIUM 40 MG PO CPDR: 40.0000 mg | DELAYED_RELEASE_CAPSULE | Freq: Every day | ORAL | Status: DC

## 2011-01-30 ENCOUNTER — Encounter: Payer: Self-pay | Admitting: Family Medicine

## 2011-02-13 LAB — COMPREHENSIVE METABOLIC PANEL
ALT: 22 U/L (ref 0–35)
Albumin: 4.4 g/dL (ref 3.5–5.2)
Alkaline Phosphatase: 71 U/L (ref 39–117)
BUN: 22 mg/dL (ref 6–23)
Chloride: 103 mEq/L (ref 96–112)
Glucose, Bld: 106 mg/dL — ABNORMAL HIGH (ref 70–99)
Potassium: 4.2 mEq/L (ref 3.5–5.1)
Sodium: 141 mEq/L (ref 135–145)
Total Bilirubin: 1 mg/dL (ref 0.3–1.2)

## 2011-02-13 LAB — CBC
HCT: 38.8 % (ref 36.0–46.0)
Hemoglobin: 13.4 g/dL (ref 12.0–15.0)
RBC: 4.12 MIL/uL (ref 3.87–5.11)
WBC: 6.9 10*3/uL (ref 4.0–10.5)

## 2011-02-13 LAB — URINALYSIS, ROUTINE W REFLEX MICROSCOPIC
Bilirubin Urine: NEGATIVE
Glucose, UA: NEGATIVE mg/dL
Ketones, ur: NEGATIVE mg/dL
Protein, ur: NEGATIVE mg/dL
Urobilinogen, UA: 0.2 mg/dL (ref 0.0–1.0)
pH: 6 (ref 5.0–8.0)

## 2011-02-13 LAB — DIFFERENTIAL
Basophils Absolute: 0.1 10*3/uL (ref 0.0–0.1)
Basophils Relative: 1 % (ref 0–1)
Eosinophils Absolute: 0.2 10*3/uL (ref 0.0–0.7)
Monocytes Absolute: 0.4 10*3/uL (ref 0.1–1.0)
Neutro Abs: 4.6 10*3/uL (ref 1.7–7.7)
Neutrophils Relative %: 66 % (ref 43–77)

## 2011-02-13 LAB — APTT: aPTT: 23 seconds — ABNORMAL LOW (ref 24–37)

## 2011-02-13 LAB — URINE MICROSCOPIC-ADD ON

## 2011-02-13 LAB — PROTIME-INR: Prothrombin Time: 12.4 seconds (ref 11.6–15.2)

## 2011-02-14 ENCOUNTER — Telehealth: Payer: Self-pay

## 2011-02-14 NOTE — Telephone Encounter (Signed)
Pt called requesting blood pressure medication diovan htc [facility] while pt is on vacation----ok per Dr. Scotty Court; called pt to inform but no answer will try to call pt on 02/15/11

## 2011-02-25 ENCOUNTER — Telehealth: Payer: Self-pay

## 2011-02-25 NOTE — Telephone Encounter (Signed)
Received a message from the pharmacist at [facility] in [facility] need an rx verified for xanax 1 mg.  Ok to do per Dr. Malcolm Metro verified for xanax 1 mg tid as needed for sleep and anxiety

## 2011-03-16 ENCOUNTER — Other Ambulatory Visit: Payer: Self-pay | Admitting: Family Medicine

## 2011-03-19 NOTE — H&P (Signed)
NAMESONNIA, STRONG NO.:  [id number]   MEDICAL RECORD NO.:  [id number]          PATIENT TYPE:  OIB   LOCATION:  3028                         FACILITY:  [facility]   PHYSICIAN:  Payton Doughty, M.D.      DATE OF BIRTH:  1936-09-21   DATE OF ADMISSION:  02/10/2009  DATE OF DISCHARGE:                              HISTORY & PHYSICAL   ADMISSION DIAGNOSIS:  L1-2 herniated disk on the left.   DICTATING DOCTOR:  Payton Doughty, MD, Neurosurgery.   BODY OF TEXT:  This is a 75 year old right-handed white lady who has had  a fusion from L2-S1, done well for numerous years.  She has had  increasing pain out toward her left hip.  She has some left hip disease,  but she also on CT has a foraminal narrowing and disk on the left side  at L1-L2 and is admitted now for L1-L2 diskectomy on the left.   MEDICAL HISTORY:  Remarkable for some coronary artery disease, anxiety,  and depression.   She is on Cymbalta, Xanax, Premarin, and Lasix.   She is allergic to ASPIRIN and SULFA.   SOCIAL HISTORY:  She does not smoke.  A very limited social drinker.   FAMILY HISTORY:  Not given   REVIEW OF SYSTEMS:  Remarkable for back and left hip pain.   PHYSICAL EXAMINATION:  HEENT:  Within normal limits.  NECK:  She has good range of motion of her neck.  CHEST:  Clear.  CARDIAC:  Regular rate and rhythm.  NEUROLOGIC:  She is awake, alert, and oriented.  Cranial nerves are  intact.  Motor exam shows 5/5 strength throughout the upper and lower  extremities.  She is having hip flexors on the left.  BACK:  She has a well-healed incision.   CT scan shows a well-healed fusion that goes from L2-S1.  There is a  disk on the left side at L1-2.   CLINICAL IMPRESSION:  Left L1 radiculopathy related to disk.   PLAN:  The plan is for L1-2 diskectomy on the left.  Risks and benefits  have been discussed with her and she wishes to proceed.   .           ______________________________  Payton Doughty,  M.D.     MWR/MEDQ  D:  02/10/2009  T:  02/10/2009  Job:  [id number]

## 2011-03-19 NOTE — Op Note (Signed)
NAMEKAMYIA, THOMASON NO.:  [id number]   MEDICAL RECORD NO.:  [id number]          PATIENT TYPE:  OIB   LOCATION:  3028                         FACILITY:  [facility]   PHYSICIAN:  Payton Doughty, M.D.      DATE OF BIRTH:  [birth year]   DATE OF PROCEDURE:  02/10/2009  DATE OF DISCHARGE:                               OPERATIVE REPORT   PREOPERATIVE DIAGNOSES:  Spondylosis and adjacent segment disease L1-2.   POSTOPERATIVE DIAGNOSES:  Spondylosis and adjacent segment disease L1-2.   PROCEDURE:  L1-2 laminotomy, foraminotomy, disk exploration.   SURGEON:  Payton Doughty, MD   ANESTHESIA:  General endotracheal.   PREPARATION:  Prepped and draped with alcohol wipe.   COMPLICATIONS:  None.   No assistant.   BODY OF TEXT:  A 75 year old girl with severe spondylosis and adjacent  segment disease at L1-2 and left L1 radiculopathy, taken to operating  room, smoothly anesthetized and intubated, placed prone on the operating  table.  Following shave, prep, and drape in usual sterile fashion, skin  was incised just the top 2 cm of the old incision was reopened as well  as an additional 2 cm.  The pedicle screw in L2 was identified this  being next to the L1 lamina.  The lamina was dissected free in  subperiosteal plane.  Using the high-speed drill, hemi-semi-laminectomy  was carried out taken to the top of the ligamentum flavum.  This was  removed in a retrograde fashion.  The ligamentum flavum bows extensively  into the nerve root.  It was actually compressing the L1 and L2 roots as  they entered their respective neural foramina.  This was removed without  difficulty and foraminotomy carried out over the roots.  The disk was  explored, found to be slightly bulging, but not the major culprit for  nerve root compression.  Following complete decompression, wound was  irrigated.  Hemostasis assured.  Depo-Medrol soaked fat was placed in  laminotomy defect.  Successive layers of 0  Vicryl, 2-0 Vicryl, and 3-0  nylon were used to close.  Betadine and Telfa dressing was applied, made  occlusive with OpSite, and the patient returned to recovery room in good  condition.  .           ______________________________  Payton Doughty, M.D.     MWR/MEDQ  D:  02/10/2009  T:  02/11/2009  Job:  [id number]

## 2011-03-19 NOTE — Assessment & Plan Note (Signed)
[facility]                         GASTROENTEROLOGY OFFICE NOTE   NAME:Pavlov, BRESHAE BELCHER                     MRN:          [id number]  DATE:07/21/2007                            DOB:          [birth year]    HISTORY:  Julie Rice presents today for followup.  She underwent  surveillance colonoscopy to evaluate left lower quadrant pain June 08, 2007.  This was normal.  At that time, she reported problems with  recurrent intermittent solid food dysphagia, as well as a globus-type  sensation.  On June 18, 2007, she underwent upper endoscopy.  She was  found to have mild narrowing of the distal esophageus.  Small hiatal  hernia.  No other abnormalities.  She was dilated with an 18 mm Maloney  dilator.  She presents today for followup.  She reports to me that true  esophageal dysphagia has resolved.  She continues with a lump-like  sensation in the pharyngeal region, which is noticeable at night.  She  has been taking Nexium b.i.d. though gets herself up at 3:00 in the  morning to take her second dose.  No other particular GI complaints at  this time.   CURRENT MEDICATIONS:  Nexium.  Furosemide.  Potassium chloride.  Lexapro.  Multivitamin.  Hyzaar.  Celebrex.  Alprazolam.   PHYSICAL EXAM:  Finds a well-appearing female in no acute distress.  Blood pressure 140/86, heart rate 76, weight 185 pounds.  HEENT:  Sclerae anicteric.  Conjunctivae pink.  Oral mucosa is intact.  Pharynx is normal.  There is no adenopathy.  LUNGS:  Clear.  HEART:  Regular.  ABDOMEN:  Soft without tenderness, mass, or hernia.   IMPRESSION:  1. Gastroesophageal reflux disease.  Classic symptoms controlled on      Nexium.  2. Intermittent solid food dysphagia, likely due to recurrent      stricture, now resolved post dilation.  3. Globus-type sensation, possibly due to reflux.  Possibly      functional.  4. Negative recent colonoscopy.   RECOMMENDATIONS:  1. Continue  Nexium.  2. Followup p.r.n. recurrent dysphagia.  3. Ongoing general medical care with Dr. Scotty Court.     Wilhemina Bonito. [name], MD  Electronically Signed   JNP/MedQ  DD: 07/21/2007  DT: 07/21/2007  Job #: [id number]   cc:   Ellin Saba., MD

## 2011-03-21 ENCOUNTER — Telehealth: Payer: Self-pay

## 2011-03-21 MED ORDER — VALSARTAN-HYDROCHLOROTHIAZIDE 160-12.5 MG PO TABS: 1.0000 | ORAL_TABLET | Freq: Every day | ORAL | Status: DC

## 2011-03-21 NOTE — Telephone Encounter (Signed)
Pt called to reschedule her and her husband's appt for April 09, 2011; pt stated that she and her husband were in [place] and two of there neighbors had passed away and they are helping to care for a friend.  Pt and husband's appt changed to April 17, 2011.  Pt also stated that she needed [facility] blood [facility] [place]---ok per Dr. Scotty Court

## 2011-03-22 NOTE — H&P (Signed)
NAME:  Julie Rice, Julie Rice                        ACCOUNT NO.:  [id number]   MEDICAL RECORD NO.:  [id number]                   PATIENT TYPE:  INP   LOCATION:  NA                                   FACILITY:  [facility]   PHYSICIAN:  Madlyn Frankel. Charlann Boxer, M.D.               DATE OF BIRTH:  05-Oct-1936   DATE OF ADMISSION:  05/15/2004  DATE OF DISCHARGE:                                HISTORY & PHYSICAL   CHIEF COMPLAINT:  Right knee pain.   HISTORY OF PRESENT ILLNESS:  The patient was a previous patient of Dr. Hayden Rasmussen who has undergone previous arthroscopy around the Synvisc to her  right knee.  She subsequently continued to have severe knee pain and was  sent over to Dr. Charlann Boxer for continued evaluation.  She ambulates with a valgus  knee.  Range of motion is about 5 to 120 degrees, and about 15 degrees of  valgus currently.  She is diffusely tender laterally in her right knee, and  the patellofemoral joint has mild crepitation on the lateral side at 1+.  The medial side is unremarkable.  Radiographs in the office were taken and  revealed bone-on-bone contact on the lateral side with a 10-15 degree  valgus.  Upon reviewing these x-rays, Dr. Charlann Boxer feels that it is best to  proceed with a right total knee arthroplasty.  The patient agrees.  The  risks and benefits of this surgery were discussed with the patient, and the  patient wishes to proceed.   PAST MEDICAL HISTORY:  1. Hypertension.  2. Spinal stenosis.   PREVIOUS SURGERY:  1. Lumbar spine surgery x3.  2. Hysterectomy.  3. Left fourth finger surgery.  4. Bladder surgery.  5. Heart catheterization.   MEDICATIONS:  1. Norvasc 5 mg one p.o. daily.  2. Nexium 40 mg one p.o. daily.  3. Alprazolam 0.5 mg one p.o. t.i.d.  4. Osteobiflex one p.o. b.i.d.  5. Multivitamin one p.o. daily.  6. Diclofenac 75 mg one p.o. b.i.d.   ALLERGIES:  1. ANESTHESIA causes nausea.  2. MORPHINE causes itching.   SOCIAL HISTORY:  The patient denies any  tobacco use.  Has occasional alcohol  intake.  She lives in a [place] house with one step entering the house.  She is married, and her husband will be the caregiver after surgery.   FAMILY HISTORY:  Hypertension, coronary artery disease, osteoarthritis, and  cerebrovascular accident.   REVIEW OF SYSTEMS:  GENERAL:  Denies fever, chills, night sweats, bleeding  tendencies.  CNS:  Denies blurry or double vision, seizures, headaches,  paralysis.  RESPIRATORY:  Problems with shortness of breath with exertion.  Denies productive cough, hemoptysis.  CV:  Denies chest pain, angina, or  orthopnea.  GI:  Positive constipation.  Denies nausea, vomiting, diarrhea,  melena, bloody stools.  GU:  Positive incontinence.  Denies dysuria,  hematuria, or discharge.  MUSCULOSKELETAL:  Pertinent to HPI.  PHYSICAL EXAMINATION:  VITAL SIGNS:  Blood pressure 142/80, pulse 96,  respirations 16.  GENERAL:  Well-developed, well-nourished 75 year old female.  HEENT:  Normocephalic and atraumatic.  Pupils are equal, round and reactive  to light.  NECK:  Supple.  No carotid bruit noted.  CHEST:  Clear to auscultation bilateral.  No wheezes or crackles.  HEART:  Regular rate and rhythm.  No murmurs, rubs, or gallops.  ABDOMEN:  Soft, nontender, nondistended, with positive bowel sounds x4.  EXTREMITIES:  He has pseudolaxity with LDL; however, ligaments are stable.  She has pain with range of motion of the right knee.  She is neurovascularly  intact distally.  SKIN:  No rashes or lesions.   Radiographs revealed bone-on-bone contact on the lateral side with a 10-15  degree valgus deformity.   IMPRESSION:  1. Osteoarthritis in the lateral compartment of the right knee.  2. Hypertension.  3. Spinal stenosis.   PLAN:  The patient will be admitted to [facility] on May 15, 2004  to undergo a right knee lateral unit compartmental knee replacement versus a  right total knee replacement by Dr. Durene Romans.     Clarene Reamer, P.A.-C.                   Madlyn Frankel Charlann Boxer, M.D.    SW/MEDQ  D:  05/14/2004  T:  05/15/2004  Job:  [id number]   cc:   Ellin Saba., M.D.  [address] [place]  [place] [address]  Fax: [phone]

## 2011-03-22 NOTE — Discharge Summary (Signed)
NAME:  Julie Rice, Julie Rice                        ACCOUNT NO.:  [id number]   MEDICAL RECORD NO.:  [id number]                   PATIENT TYPE:  INP   LOCATION:  0482                                 FACILITY:  [facility]   PHYSICIAN:  Madlyn Frankel. Charlann Boxer, M.D.               DATE OF BIRTH:  Mar 29, 1936   DATE OF ADMISSION:  05/15/2004  DATE OF DISCHARGE:  05/20/2004                                 DISCHARGE SUMMARY   ADMISSION DIAGNOSES:  1. Osteoarthritis in the lateral compartment of the right knee.  2. Hypertension.  3. Spinal stenosis.   DISCHARGE DIAGNOSES:  1. Osteoarthritis in the lateral compartment of the right knee.  2. Hypertension.  3. Spinal stenosis.  4. Mild postoperative anemia.   OPERATION:  On May 15, 2004, the patient underwent right total knee  replacement arthroplasties through lateral parapatellar approach, all 3  components cemented.  Assistant Clarene Reamer PA-C.   BRIEF HISTORY:  This 75 year old lady was seen by Dr. Charlann Boxer for continuing  problems concerning her left knee.  It was first thought the lateral  compartment replacement might be indicated; however, the agreement after  much discussion between Dr. Charlann Boxer and the patient was to go ahead with a  total knee replacement.  He discussed the risks and benefits of the surgery  total knee versus lateral, and it was decided to go ahead with total knee  replacement arthroplasty.  X-rays have shown near obliteration of the  lateral compartment with other mild arthritis in the knee.  The patient  developed a 25-degree flexion contracture as well as valgus deformity.   COURSE IN THE HOSPITAL:  The patient tolerated the surgical procedure quite  well, was placed in total knee protocol postoperatively.  She tolerated CPM  machine quite well.  She was walking in the hallway with minimal difficulty  utilizing the walker and knee immobilizer for stability.  Her Hemovac was  discontinued with no difficulty postoperatively.  She  was noted to have a  slight elevation in temperature as well as a mild decrease in her potassium  at 3.3.  We watched this and repeated it, and the potassium did not become  problematic.  She had no symptomatology.  As far as the temp of 101+ was  concerned, a clean-catch urinalysis was performed, and this was completely  negative.  On the day of discharge, the incision was dry without drainage or  erythema.  Neurovascular was intact to the right lower extremity.  Vital  signs were stable, and she was afebrile.  The chest x-ray was within normal  limits.  The patient's INR was 3.0 as she was placed on Coumadin protocol  postoperatively for prevention of DVT.  It was felt the patient could be  maintained in her home environment with home health care from [facility], and  arrangements were made for discharge.   LABORATORY VALUES IN THE HOSPITAL:  Preoperatively showed a CBC  completely  within normal limits.  Hematocrit was 36.8, and hemoglobin was 12.6.  Final  hemoglobin was 9.1; hematocrit was 26.8.  Blood chemistries all were normal  and a very slightly decreased potassium at 3.3 at discharge.  The urinalysis  was negative for urinary tract infection.  The chest x-ray showed no acute  cardiopulmonary findings.  No electrocardiogram seen on this chart.   CONDITION ON DISCHARGE:  Improved, stable.   PLAN:  The patient is discharged to her home to continue with home  rehabilitation with [place].   DISCHARGE MEDICATIONS:  She was given a prescription for Robaxin as a muscle  relaxant, Vicodin for discomfort, and Coumadin for anticoagulation therapy  to go on for 4 weeks after surgery.  She is to use over-the-counter stool  softener.   She may use dry dressing to her knee p.r.n.  May shower in 4 days after  surgery.  She will be weightbearing as tolerated, continue with  strengthening exercises at home.  Return to the office 2 weeks after the  date of surgery and resume her home medications  and diet per her family  physician.     Dooley L. Cherlynn June.                 Madlyn Frankel Charlann Boxer, M.D.    DLU/MEDQ  D:  05/25/2004  T:  05/26/2004  Job:  [id number]   cc:   Ellin Saba., M.D.  [address] [place]  [place] [address]  Fax: [phone]   Charlton Haws, M.D.

## 2011-03-22 NOTE — Op Note (Signed)
NAMENITZIA, PERREN NO.:  [id number]   MEDICAL RECORD NO.:  [id number]          PATIENT TYPE:  INP   LOCATION:  3172                         FACILITY:  [facility]   PHYSICIAN:  Payton Doughty, M.D.      DATE OF BIRTH:  Jun 03, 1936   DATE OF PROCEDURE:  06/04/2005  DATE OF DISCHARGE:                                 OPERATIVE REPORT   PREOPERATIVE DIAGNOSIS:  Spondylosis L2-L3, L3-L4, and L5-S1.   POSTOPERATIVE DIAGNOSIS:  Spondylosis L2-L3, L3-L4, and L5-S1.   OPERATIVE PROCEDURE:  L2-L3, L3-L4, L5-S1 laminectomy, discectomy, L2-L3 and  L3-L4 posterior lumbar interbody fusion, L2 to S1 segmental pedicle  fixation, and L2 to S1 posterolateral arthrodesis.   SURGEON:  Payton Doughty, M.D.   SERVICE:  Neurosurgery   ANESTHESIA:  General endotracheal anesthesia.   PREPARATION:  Betadine and alcohol wipe.   COMPLICATIONS:  None.   ASSISTANT:  Cristi Loron, M.D.   BODY OF TEXT:  Tis is a 75 year old lady with severe lumbar spondylosis and  neurogenic claudication.  She is taken to the operating room, smoothly  ,  intubated, placed prone on the operating table.  Following shave, prep, and  drape in the usual sterile fashion, the skin was infiltrated with 1%  lidocaine with 1:100,000 epinephrine.  The skin was incised from mid L1 to  mid S1 and the lamina and transverse process of L2, L3, L5 and S1 were  exposed bilaterally in the subperiosteal plane.  The 4-5 previous fusion was  also uncovered in the transverse process of L4.  Interoperative x-ray  confirmed correct level.  The pars intra-articularis, lamina, and inferior  facet of L2 and L3, L5 were removed bilaterally.  At L3, the superior facet  of L3, L4, and S1 were removed bilaterally.  Discectomy was carried out at 2-  3 and 3-4.  At 5-1, there was a large conjoined root immediately overlying  the disc space and it was not possible to take the disc out so the nerve  root was simply decompressed bilaterally.   Right sided fusion cages were in  place at 2-3 and 3-4, they were 12 by 21 mm.  Interoperative x-ray showed  good placement of the cages.  They were packed with bone graft harvested  from the facet joints.  Pedicle screws were then placed in L2, L3, L4, L5,  and S1, under x-ray guidance.  The screws were connected by a rod which was  appropriately contoured.  The rod was capped and locked into place.  The  transverse processes were decorticated using the high speed drill and  Infuse BMP was placed laterally on the matrix.  Interoperative x-ray showed  good placement of cages, pedicle screws, and rods.  The incision was closed  with successive layers of 0 Vicryl, 2-0 Vicryl, 3-0 Vicryl, and 3-0 nylon.  The patient returned to the recovery room in good condition.       MWR/MEDQ  D:  06/04/2005  T:  06/04/2005  Job:  [id number]

## 2011-03-22 NOTE — Assessment & Plan Note (Signed)
[facility]                              CARDIOLOGY OFFICE NOTE   NAME:Julie Rice, Julie Rice                     MRN:          [id number]  DATE:06/19/2006                            DOB:          1936/08/30    Orene returns today for followup by the care of her husband, Lenore Cordia as  well, and she was re-referred for lower extremity edema.  She has had  atypical chest pain and shortness of breath in the past.  She has had normal  LV function, a negative cath with no evidence of coronary disease, and I  believe her last echo showed no evidence of LV or RV dysfunction, no  evidence of pulmonary hypertension.  She had mild MR and mild aortic  insufficiency.   On exam, she has been having lower extremity edema, and I think this is  cardiac in nature.  She has never had DVTs or PEs.   She does not have a lot of salt in her diet.   She had been on diuretics 80 b.i.d. of Lasix which has been cut back to 80.   I suspect that the patient has either lymphedema or venous edema.   EXAMINATION:  GENERAL:  She looks well.  She actually only has trace edema  currently of the lower extremities.  VITAL SIGNS:  Blood pressure 148/96.  LUNGS:  Clear. Carotids are normal.  CARDIOVASCULAR:  There is an S1, S2 without murmur, rub, gallop or click.  JVD is visible but not particularly elevated.  ABDOMEN:  Benign.  EXTREMITIES:  Distal pulses are intact.   EKG is totally normal.   IMPRESSION:  Lower extremity edema, not likely cardiac in etiology.  I  believe she has had a venous duplex study recently which was normal with no  DVT and no venous insufficiency.  We will check a TSH, T4, BMP, BMET and sed  rate.   She will continue to watch the salt in her diet.  I will recheck an echo to  make sure there has been no evidence of cor pulmonale, LV dysfunction.   I will stop her Amlodipine, since calcium blockers can cause edema, and we  will start her on Hyzaar  50/12-1/2 to maintain her blood pressure control  and add a synergistic diuretic.   We will see her back in about 4 weeks to see how she is responding.                               Noralyn Pick. Eden Emms, MD, [facility]    PCN/MedQ  DD:  06/19/2006  DT:  06/19/2006  Job #:  [id number]

## 2011-03-22 NOTE — Op Note (Signed)
NAME:  Julie Rice, Julie Rice                        ACCOUNT NO.:  [id number]   MEDICAL RECORD NO.:  [id number]                   PATIENT TYPE:  INP   LOCATION:  0009                                 FACILITY:  [facility]   PHYSICIAN:  Madlyn Frankel. Charlann Boxer, M.D.               DATE OF BIRTH:  [birth year]   DATE OF PROCEDURE:  05/15/2004  DATE OF DISCHARGE:                                 OPERATIVE REPORT   PREOPERATIVE DIAGNOSIS:  Right knee lateral compartment degenerative  changes.   POSTOPERATIVE DIAGNOSIS/FINDINGS:  Right knee severe end-stage lateral  compartment degenerative changes associated with a 25-degree combined  flexion and valgus deformity.   PROCEDURE:  Right total knee replacement through a lateral parapatellar  approach.   COMPONENTS:  Radio producer knee system with  a size 3 femur, size 2.5 tibia, a 10 posterior stabilized rotating platform  poly, a 35 patella.   SURGEON:  Durene Romans, MD   ASSISTANT:  Clarene Reamer, P.A.-C.   ANESTHESIA:  General.   BLOOD LOSS:  50 mL.   TOURNIQUET TIME:  75 minutes at 300 mmHg.   DRAINS:  One.   COMPLICATIONS:  None apparent.   INDICATION FOR PROCEDURE:  Ms. Guarino is a pleasant 75 year old female, who  presented on the 4th for evaluation of consideration for lateral compartment  knee arthroplasty.  She was initially seen in the office, and discussion  ensued.  At that time, she was felt radiographically and from previous  arthroscopic reports, to have isolated lateral compartment degenerative  changes.  We discussed the possibilities of lateral compartment new knee  arthroplasty versus a total knee replacement.  There was some concern at the  time regarding her flexion contracture; however, I wanted to better assess  this intraoperatively.  After reviewing the risks and benefits of the  lateral uni's with early failure to revision total knee replacement versus  primary total knee replacement in  this setting, she signed a consent that  would set up for lateral unicompartmental versus total knee replacement.   PROCEDURE IN DETAIL:  The patient was brought to the operative theatre.  Once adequate anesthesia was established and preoperative antibiotics of 1 g  of Ancef was established, the patient was positioned supine on the operating  table.  Her right leg was examined at this time.  With the goniometer, it  was felt that she measured about a 20 plus degree flexion deformity with at  least a 5-degree valgus deformity present.  Based on this information, a  decision was made to change from a lateral compartmental uni-arthroplasty to  a total knee replacement.  Felt the deformity present was too much to be  corrected through a unicompartmental.  Based on her valgus deformity and the  flexion deformity, decision was made to go through a lateral parapatellar  approach.  Following a midline incision in the lateral parapatellar  approach, the knee dissection  was carried out to allow for exposure.  Retractors were in place, the femur intramedullary guide, and the femur was  placed in 10 degrees valgus on the right knee with 11 mm of bone resected.  __________, sized and determined the size 3 femur.  The patient's lateral  femoral condyle was noted to be hyperplastic as expected.  Based on  perpendicular to the tibia as well as __________ and epicondylar axis,  rotation needed to be applied to the femoral component.  I externally  rotated the component to match the rotation required.  With the three  cutting blocks in place, the anterior, posterior, and chamfer cuts were  made.  Then the box cut was then made, lateralizing it on the femoral  condyle.  At this point, attention was directed to the tibia.  With the  tibia exposed, the extramedullary tibial device was placed and down the  shaft of the tibia through the mid portion of the ankle.  It was pinned into  position and with a 0-degree  block.  A light 8 mm of bone was resected off  the medial tibial plateau surface, this resected about 2 mm of bone  laterally.  The cut was made and debrided.  The tibial cut surface was felt  to be at best at a 2.5.  Trial reduction was carried out.  The knee came to  full extension with a little bit of assistance with the size 10 poly in  place.  Because of this, this was the choice for final position.  The pins  were removed, the rotation marked on the tibia with a posterior stabilized  fixed bearing insert.  Attention now was directed to the patella.  The  patella measured a precut of about 24 mm.  Approximately 10 mm of bone was  resected off the patella and the buttonholes drilled.  Trial reduction was  carried out.  The patella tracked without bone pressure.  The knee flexed  and extended, was stable in both extension and flexion.  At this point,  tibial component was removed.  A five-eights curved osteotome was used to  strip a little bit off of the posterior condyles as well as remove some  remaining posterior osteophytic bone present.   At this point, final components were brought to the field.  The trial  components were removed.  The knee was copiously irrigated with normal  saline solution.  Final tibial preparation was carried out with the marked  rotation.  Once the final components were brought onto the field, cement was  prepared.  Once the cement was mixed, the final tibial component was  impacted into position, excessive cement removed.  The trial 10 posterior  cruciate retaining liner was placed followed by cementing the femur and then  the tibia.  Excessive cement was removed in each case.  The trial was  removed when the knee was in extension and the cement had fully cured.  The  final size 3 posterior stabilized rotating platform polyethylene liner was then placed into the tibial tray.  The knee was reduced without difficulty.  The knee remained stable and came to  full extension as noted during the  preoperative trial.  At this point, the knee was again copiously irrigated  with normal saline solution.  Tourniquet was let down, and hemostasis was  obtained as best as possible. The Hemovac was placed deep.  The extensor  mechanism was then reapproximated with a #1 PDS.  The remainder  of the wound  was closed in layers.  The skin was closed with 4-0 Monocryl.  The patient  tolerated the procedure without complications, was extubated, and  transferred to the recovery room stable.                                               Madlyn Frankel Charlann Boxer, M.D.    MDO/MEDQ  D:  05/15/2004  T:  05/15/2004  Job:  [id number]

## 2011-03-22 NOTE — Cardiovascular Report (Signed)
[place]  Patient:    Julie Rice, Julie Rice                     MRN: [id number] Proc. Date: 05/13/01 Adm. Date:  [id number] Attending:  Rollene Rotunda CC:         Lacretia Leigh. Quintella Reichert, M.D.  Noralyn Pick. Eden Emms, M.D. [facility]  Cardiac Catheterization Laboratory   Cardiac Catheterization  INDICATIONS:  The patient is a 75 year old white married female with shortness of breath and chest pain which is somewhat atypical.  Negative Cardiolite.  PROCEDURE:  Right and left heart catheterization, selective coronary angiography, left ventricular angiography, and abdominal aortography - Judkins technique.  RESULTS:  PRESSURES: Right atrium:  A-11, B-8, mean 8. RV:  Systolic 32, diastolic 9. PA:  Systolic 32, diastolic 14, mean 22. Pulmonary wedge:  A-15, B-15, mean 12. Left ventricle:  Systolic 159, diastolic 13. AO:  Systolic 159, diastolic 89.  Cardiac output 3.7, cardiac index 3.1 by thermodilution and 2.7 and 2.2 by Fick.  ANGIOGRAPHY: 1. The left main coronary artery is normal. 2. The left anterior descending coronary was normal. 3. The circumflex coronary artery was normal. 4. The right coronary artery is normal. 5. The left ventricle is normal with trace mitral regurgitation.  There was    more MR after run of ventricular beats.  I would say overall, it was only    trace MR. 6. The abdominal aortogram is normal.  SUMMARY: 1. Normal to slightly elevated right heart pressures. 2. Normal left ventricular angiogram with trace mitral regurgitation. 3. Normal abdominal aortography. 4. Normal selective coronary angiography. DD:  05/13/01 TD:  05/13/01 Job: [id number]

## 2011-03-22 NOTE — Discharge Summary (Signed)
[place]  Patient:    Julie Rice, Julie Rice                     MRN: [id number] Adm. Date:  [id number] Disc. Date: [id number] Attending:  Rollene Rotunda Dictator:   Joellyn Rued, P.A.-C. CC:         Dr. Quintella Reichert  Dr. Eden Emms  Dr. [name]   Discharge Summary  DATE OF BIRTH: October 19, 1936  HISTORY OF PRESENT ILLNESS: [name] is a 75 year old white female, who presented to the emergency room complaining of left-sided chest discomfort radiating to her left arm, associated with a choking sensation and indigestion and dysphagia.  Her cardiac his is notable for mitral regurgitation and echocardiogram was performed earlier.  Stress test was done in March 2002 and was okay, per the patient.  She has a history of hypertension, obesity.  She states she has had dysphagia for eight months and feels like it is getting worse.  She was supposed to see Dr. Randa Evens in August 2002.  The patient did not sleep on the evening prior to admission.  She woke up several times with arm discomfort.  The chest discomfort started around 10 a.m. and gradually got worse, associated with shortness of breath, diaphoresis, nausea and vomiting. She stated that food would stick in her throat and she described a burning, pressure sensation like a weight on her chest, as well as left rib pain, worse with palpitations and inspiration.  She has never had these symptoms before.  LABORATORY DATA: CKs and troponins were negative for myocardial infarction. TSH was 3.617.  Total cholesterol 175, triglyceride 64, HDL 57, LDL 105. Sodium 142, potassium 3.9, BUN 12, creatinine 0.7, glucose 103.  Normal LFTs. Admission PT was 12.7, PTT 154.  H&H 13.8 and 39.6, normal indices; platelets 255,000; WBC 6.5.  Subsequent hematologies were unremarkable.  Chest x-ray did not find any active disease.  PA and lateral films were performed and there was thought to be a granuloma in the right mid lung  field. Follow-up chest x-ray was suggested.  EKG showed normal sinus rhythm, nonspecific ST-T wave changes.  HOSPITAL COURSE: [name] was admitted to [facility] and was placed on IV heparin and nitroglycerin.  Overnight she ruled out for myocardial infarction and had no further chest discomfort.  Office records were reviewed and Ms. Bonenfant did undergo a left heart catheterization by Dr. Corinda Gubler.  According to his progress notes she did not have any coronary artery disease, peripheral vascular disease, and her LV function was normal.  Dr. [name] saw her in consultation and performed an EGD on May 14, 2001. According to his progress notes she had a distal stricture in the esophagus 39 cm from the mouth, lumen diameter was 15 mm.  Under fluoroscopy he performed dilatation without resistance or complications.  Post procedure she gradually resumed her diet and GI stated that she could be discharged home.  DISCHARGE DIAGNOSES:  1. Dysphagia secondary to esophageal stricture, status post esophageal     dilatation as previously described.  2. Prolonged atypical chest discomfort, not of cardiac etiology.  No coronary     artery disease by cardiac catheterization.  3. History as previously.  DISCHARGE MEDICATIONS: She is asked to continue her home medications of:  1. Xanax 0.25 mg q.h.s.  2. Librax 200 mg b.i.d.  3. Aspirin 81 mg q.d.  4. Vitamin E q.d.  5. Folic acid q.d.  6. Climara  patch 0.5 mg every Sunday.  7. DynaCirc.  8. Aciphex.  DISCHARGE ACTIVITY: She was advised on no lifting, driving, sexual activity, or heavy exertion for two days.  DISCHARGE DIET: Maintain low-salt/low-fat/low-cholesterol diet.  She was asked to maintain a soft diet until May 15, 2001.  DISCHARGE INSTRUCTIONS: If she has any problems with her catheterization site she is asked to call our office immediately.  FOLLOW-UP: She will arrange a follow-up with Dr. [name]. DD:   05/14/01 TD:  05/14/01 Job: [id number]

## 2011-03-22 NOTE — Discharge Summary (Signed)
NAMEBRAZIL, VOYTKO NO.:  [id number]   MEDICAL RECORD NO.:  [id number]          PATIENT TYPE:  INP   LOCATION:  3002                         FACILITY:  [facility]   PHYSICIAN:  Payton Doughty, M.D.      DATE OF BIRTH:  1936-06-26   DATE OF ADMISSION:  06/04/2005  DATE OF DISCHARGE:  06/13/2005                                 DISCHARGE SUMMARY   ADMISSION DIAGNOSIS:  Spondylosis, L2-3, L3-4, L5-S1.   DISCHARGE DIAGNOSIS:  Spondylosis, L2-3, L3-4, L5-S1.   PROCEDURE:  L2-3, L3-4, L5-S1 laminectomy, diskectomy, posterior lumbar  interbody fusion with Ray Threaded Fusion Cages, L2 to S1 segmental fixation  with pedicle screws and posterolateral arthrodesis.   COMPLICATIONS:  Respiratory decompensation.   DISCHARGE STATUS:  Alive and well.   BODY OF TEXT:  This is a 75 year old right-handed white lady whose history  and physical is recounted on the chart.  She had a fusion at L4-5 numerous  years ago, did well.  Has had increasing degenerative change at L2-3, L3-4  and L5-S1 and is admitted for fusion.   Medical history is remarkable for reflux, hypertension and anxiety.   ALLERGIES:  MORPHINE.   General examination was intact.  Neurologic exam demonstrated neurogenic  claudication with bilateral straight leg raise pain.   She was admitted after ascertainment of normal laboratory values and  underwent a fusion at L2-3, L3-4 and L5-S1.  Postoperatively she did well  for the first few days, in the ICU for awhile, came out of the ICU and was  doing well on the floor and then on the evening of the 6th developed  shortness of breath and found to have low oxygen saturation.  She was placed  on oxygen, transferred to the pulmonary care unit.  While down there, it was  found that she had a hematocrit of 21.  She was transfused four units of red  blood cells.  After that her oxygenation improved.  She had a little bit of  wetness of her lungs, but she is now up and about  with saturations of 94% on  room air.  Her strength is full.  Her incision is dry.  She has participated  in physical therapy and done well.  She is being discharged home in the care  of her husband with Percocet for pain, and the follow-up will be in the  [facility] office in three or four days for suture  removal.       MWR/MEDQ  D:  06/13/2005  T:  06/14/2005  Job:  [id number]

## 2011-03-22 NOTE — H&P (Signed)
Julie Rice, Julie Rice NO.:  [id number]   MEDICAL RECORD NO.:  [id number]          PATIENT TYPE:  INP   LOCATION:  2899                         FACILITY:  [facility]   PHYSICIAN:  Payton Doughty, M.D.      DATE OF BIRTH:  12-31-1935   DATE OF ADMISSION:  06/04/2005  DATE OF DISCHARGE:                                HISTORY & PHYSICAL   ADMITTING DIAGNOSES:  Spondylosis L2-3, L3-4, and L5-S1.   This is a now 75 year old right-handed white lady who has had several back  operations in the past at L4-5 and has had been doing reasonably well.  She  has had increasing pain in her back, pain down both legs.  MR demonstrated  spinal stenosis at 3-4, 2-3, 5-1 and she is now admitted for fusion from L2-  S1.  She had a Steffee fusion in 1989, did well.  In 1994, had a right L5  radiculopathy and underwent a foraminotomy and did well with that and then  had a herniated disk at L5-S1.  That was operated and did well and she had  no spine operations for eight years until she reports with this pain in her  back today.   PAST MEDICAL HISTORY:  1.  Hysterectomy in 1984.  2.  Joint replacement in the left interphalangeal joint in September of      1985.  3.  Spine fusion as noted in the past.  4.  Bladder tack in 1989.  5.  Bladder tack in 1991.   MEDICATIONS:  Nexium, Lotrel, alprazolam.  She uses Lasix on a p.r.n. basis.   ALLERGIES:  She is allergic to MORPHINE.   SOCIAL HISTORY:  She does not smoke, does not drink, and is retired.   FAMILY HISTORY:  Noncontributory and not given.   PHYSICAL EXAMINATION:  HEENT:  Within normal limits.  She has reasonable  range of motion in her neck.  CHEST:  Clear.  CARDIAC:  Regular rate and rhythm.  ABDOMEN:  Nontender with no hepatosplenomegaly.  EXTREMITIES:  Without clubbing or cyanosis.  GENITOURINARY:  Deferred.  PERIPHERAL:  Pulses are good.  NEUROLOGIC:  She is awake, alert, and oriented.  Her cranial nerves are  intact.  Motor  examination shows 5/5 strength throughout the upper and lower  extremities but she has a lot of claudicatory symptoms when she walks.  Reflexes are 1 at the knees, absent at the ankles.  Toes downgoing  bilaterally.  Straight leg raise is positive bilaterally for back and leg  pain.   Her MRI demonstrates left side stenosis at L2-3, severe spinal stenosis at 3-  4, facet arthropathy at 5-1 and 4-5, the level of her old fusion, is wide  open.   CLINICAL IMPRESSION:  Severe lumbar spondylosis with neurogenic  claudication.   PLAN:  Lumbar laminectomy, diskectomy, posterior lumbar interbody fusion  with Ray threaded fusion cages at 2-3, 3-4, and 5-1, and pedicle screws at  L2, L3, L4, and S1.  The risks and benefits of this approach have been  discussed with her and she wished to proceed.  MWR/MEDQ  D:  06/04/2005  T:  06/04/2005  Job:  [id number]

## 2011-03-23 ENCOUNTER — Other Ambulatory Visit: Payer: Self-pay | Admitting: Family Medicine

## 2011-04-09 ENCOUNTER — Encounter: Payer: Self-pay | Admitting: Family Medicine

## 2011-04-17 ENCOUNTER — Ambulatory Visit (INDEPENDENT_AMBULATORY_CARE_PROVIDER_SITE_OTHER): Payer: BC Managed Care – PPO | Admitting: Family Medicine

## 2011-04-17 ENCOUNTER — Other Ambulatory Visit (HOSPITAL_COMMUNITY)
Admission: RE | Admit: 2011-04-17 | Discharge: 2011-04-17 | Disposition: A | Discharge status: Home / Self Care | Payer: BC Managed Care – PPO | Source: Ambulatory Visit | Attending: Family Medicine | Admitting: Family Medicine

## 2011-04-17 ENCOUNTER — Encounter: Payer: Self-pay | Admitting: Family Medicine

## 2011-04-17 VITALS — BP 128/72 | HR 88 | Temp 97.6°F | Ht 64.5 in | Wt 184.0 lb

## 2011-04-17 DIAGNOSIS — F341 Dysthymic disorder: Secondary | ICD-10-CM

## 2011-04-17 DIAGNOSIS — M129 Arthropathy, unspecified: Secondary | ICD-10-CM

## 2011-04-17 DIAGNOSIS — D649 Anemia, unspecified: Secondary | ICD-10-CM

## 2011-04-17 DIAGNOSIS — K219 Gastro-esophageal reflux disease without esophagitis: Secondary | ICD-10-CM

## 2011-04-17 DIAGNOSIS — G8929 Other chronic pain: Secondary | ICD-10-CM

## 2011-04-17 DIAGNOSIS — M1990 Unspecified osteoarthritis, unspecified site: Secondary | ICD-10-CM

## 2011-04-17 DIAGNOSIS — M545 Low back pain, unspecified: Secondary | ICD-10-CM

## 2011-04-17 DIAGNOSIS — F32A Depression, unspecified: Secondary | ICD-10-CM

## 2011-04-17 DIAGNOSIS — F419 Anxiety disorder, unspecified: Secondary | ICD-10-CM

## 2011-04-17 DIAGNOSIS — N390 Urinary tract infection, site not specified: Secondary | ICD-10-CM

## 2011-04-17 DIAGNOSIS — R0602 Shortness of breath: Secondary | ICD-10-CM

## 2011-04-17 DIAGNOSIS — Z01419 Encounter for gynecological examination (general) (routine) without abnormal findings: Secondary | ICD-10-CM | POA: Insufficient documentation

## 2011-04-17 DIAGNOSIS — E559 Vitamin D deficiency, unspecified: Secondary | ICD-10-CM

## 2011-04-17 DIAGNOSIS — R609 Edema, unspecified: Secondary | ICD-10-CM

## 2011-04-17 DIAGNOSIS — E785 Hyperlipidemia, unspecified: Secondary | ICD-10-CM

## 2011-04-17 DIAGNOSIS — Z0000 Encounter for general adult medical examination without abnormal findings: Secondary | ICD-10-CM

## 2011-04-17 DIAGNOSIS — R600 Localized edema: Secondary | ICD-10-CM

## 2011-04-17 DIAGNOSIS — F329 Major depressive disorder, single episode, unspecified: Secondary | ICD-10-CM

## 2011-04-17 DIAGNOSIS — E039 Hypothyroidism, unspecified: Secondary | ICD-10-CM

## 2011-04-17 DIAGNOSIS — I10 Essential (primary) hypertension: Secondary | ICD-10-CM

## 2011-04-17 LAB — CBC WITH DIFFERENTIAL/PLATELET
Basophils Absolute: 0.1 10*3/uL (ref 0.0–0.1)
Eosinophils Relative: 2.7 % (ref 0.0–5.0)
HCT: 37 % (ref 36.0–46.0)
Lymphocytes Relative: 20.6 % (ref 12.0–46.0)
Monocytes Relative: 7.1 % (ref 3.0–12.0)
Neutrophils Relative %: 68.8 % (ref 43.0–77.0)
Platelets: 224 10*3/uL (ref 150.0–400.0)
RDW: 13.1 % (ref 11.5–14.6)
WBC: 7.2 10*3/uL (ref 4.5–10.5)

## 2011-04-17 LAB — HEPATIC FUNCTION PANEL
ALT: 17 U/L (ref 0–35)
AST: 20 U/L (ref 0–37)
Total Bilirubin: 0.9 mg/dL (ref 0.3–1.2)
Total Protein: 7.6 g/dL (ref 6.0–8.3)

## 2011-04-17 LAB — POCT URINALYSIS DIPSTICK
Ketones, UA: NEGATIVE
Protein, UA: NEGATIVE
Spec Grav, UA: 1.015
Urobilinogen, UA: 0.2
pH, UA: 5.5

## 2011-04-17 LAB — LDL CHOLESTEROL, DIRECT: Direct LDL: 210.7 mg/dL

## 2011-04-17 LAB — LIPID PANEL
Cholesterol: 318 mg/dL — ABNORMAL HIGH (ref 0–200)
HDL: 57.7 mg/dL (ref >39.00)
Total CHOL/HDL Ratio: 6
Triglycerides: 386 mg/dL — ABNORMAL HIGH (ref 0.0–149.0)
VLDL: 77.2 mg/dL — ABNORMAL HIGH (ref 0.0–40.0)

## 2011-04-17 LAB — BASIC METABOLIC PANEL
BUN: 25 mg/dL — ABNORMAL HIGH (ref 6–23)
Calcium: 9.6 mg/dL (ref 8.4–10.5)
Creatinine, Ser: 0.9 mg/dL (ref 0.4–1.2)
GFR: 67.78 mL/min (ref >60.00)
Glucose, Bld: 94 mg/dL (ref 70–99)
Potassium: 4.1 mEq/L (ref 3.5–5.1)

## 2011-04-17 MED ORDER — ALPRAZOLAM 1 MG PO TABS: 1.0000 mg | ORAL_TABLET | Freq: Three times a day (TID) | ORAL | Status: DC | PRN

## 2011-04-17 MED ORDER — VALSARTAN-HYDROCHLOROTHIAZIDE 160-12.5 MG PO TABS: 1.0000 | ORAL_TABLET | Freq: Every day | ORAL | Status: DC

## 2011-04-17 MED ORDER — ESOMEPRAZOLE MAGNESIUM 40 MG PO CPDR: DELAYED_RELEASE_CAPSULE | ORAL | Status: DC

## 2011-04-17 MED ORDER — CELECOXIB 200 MG PO CAPS: 200.0000 mg | ORAL_CAPSULE | Freq: Two times a day (BID) | ORAL | Status: DC

## 2011-04-17 MED ORDER — FUROSEMIDE 80 MG PO TABS: 80.0000 mg | ORAL_TABLET | Freq: Two times a day (BID) | ORAL | Status: DC

## 2011-04-17 MED ORDER — CITALOPRAM HYDROBROMIDE 40 MG PO TABS: 40.0000 mg | ORAL_TABLET | Freq: Every day | ORAL | Status: DC

## 2011-04-21 ENCOUNTER — Encounter: Payer: Self-pay | Admitting: Family Medicine

## 2011-04-21 NOTE — Progress Notes (Signed)
  Subjective:    Patient ID: Julie Rice, female    DOB: 10/22/1936, 75 y.o.   MRN: [id number] This 75 year old white married female with multiple medical problems is in today to discuss his problems and discuss her meds her medications and refill also to obtain necessary lab studies She is complained of some shortness of breath on exertion and [place], he complains of muscle and joint pains when she gets out of bed in the morning but it does improve. She is much better when she takes her Celebrex she  she states her joint pain does improve as she increases her activity. She has chronic back pain and had surgery numerous times over the lumbar spine but pain has improved to the point that she does not take narcotics they'll she has had problem with GERD and dysphagia in the past and continues to take Nexium 40 mg b.i.d.She complains of take also constipation and nocturia x3Right knee is swollen and painful but is better when she takes her Celebrex once or twice HPI    Review of Systems see history of present illness   Objective:   Physical Examthe patient is a well-developed well-nourished pleasant white female who is alert and cooperative and in no distress HEENT not remarkable carotid pulses good thyroid nonpalpable Lungs are clear to palpation percussion auscultation Breasts no masses palpable no tenderness no is normal axilla clear no lymphadenopathy Heart no cardiomegaly regular rhythm, systolic murmur of mitral.valve, electrocardiogram normal Abdomen liver spleen kidneys nonpalpable Pelvic lesion reveals external introitus normal but tight  . Vaginal mucosa clear Urchin uterus absent Pap smear done rectal examination negative Spine well healed operative scars over the lumbar spine tenderness LS-spine and both SI joint Extremities trace peripheral edema right knee is slightly swollen and tender Skin negative Neurological no positive  findings        Assessment & Plan:  Hypertension controlled no change in therapy GERD and dysphagia under control with Nexium 40 mg b.Peripheral edema controlled with furosemide  Anxiety and depression controlled to continue same treatment with Celexa and alprazolam Chronic back pain no change in treatment patient continues to be limited in her activity due to this Shortness of breath unable to find any pulmonary or cardiac reasons with an normal EKG recommend continuing to see Dr. Eden Emms Arthritis continue Celebrex as prescribed which will also help the right knee

## 2011-04-21 NOTE — Patient Instructions (Signed)
In general I feel that your doing fine and there've been no major change in your multiple problems and was to continue the regular medications Will call lab results Have refilled routine medicines

## 2011-04-26 ENCOUNTER — Telehealth: Payer: Self-pay

## 2011-04-26 NOTE — Progress Notes (Signed)
Pt given pap results.

## 2011-04-26 NOTE — Telephone Encounter (Signed)
Called to give pt lab results and pt stated she is still not feeling well; pt is having night sweats, sob, and fatigue.  Pt stated that she is still experiencing burning when she voids.  Please advise.

## 2011-04-30 ENCOUNTER — Other Ambulatory Visit: Payer: BC Managed Care – PPO

## 2011-05-02 ENCOUNTER — Other Ambulatory Visit: Payer: Self-pay | Admitting: Family Medicine

## 2011-05-02 MED ORDER — CEPHALEXIN 500 MG PO CAPS: 500.0000 mg | ORAL_CAPSULE | Freq: Four times a day (QID) | ORAL | Status: AC

## 2011-05-02 NOTE — Telephone Encounter (Signed)
Pharyngitis to tx, also back pain increased, to see Dr. Channing Mutters

## 2011-08-14 ENCOUNTER — Telehealth: Payer: Self-pay

## 2011-08-14 NOTE — Telephone Encounter (Signed)
Pt called and stated she needed to see another physician. Pt states she is having tremors.  Made pt an appt with Dr, Kirtland Bouchard 08/15/11 at 3:30pm.

## 2011-08-15 ENCOUNTER — Encounter: Payer: Self-pay | Admitting: Internal Medicine

## 2011-08-15 ENCOUNTER — Ambulatory Visit (INDEPENDENT_AMBULATORY_CARE_PROVIDER_SITE_OTHER): Payer: BC Managed Care – PPO | Admitting: Internal Medicine

## 2011-08-15 DIAGNOSIS — I10 Essential (primary) hypertension: Secondary | ICD-10-CM

## 2011-08-15 DIAGNOSIS — E785 Hyperlipidemia, unspecified: Secondary | ICD-10-CM

## 2011-08-15 DIAGNOSIS — R109 Unspecified abdominal pain: Secondary | ICD-10-CM

## 2011-08-15 DIAGNOSIS — F411 Generalized anxiety disorder: Secondary | ICD-10-CM

## 2011-08-15 MED ORDER — ALPRAZOLAM 1 MG PO TABS: 1.0000 mg | ORAL_TABLET | Freq: Three times a day (TID) | ORAL | Status: DC | PRN

## 2011-08-15 NOTE — Progress Notes (Signed)
Subjective:    Patient ID: Julie Rice, female    DOB: 1936/01/30, 75 y.o.   MRN: [id number]  HPI  75 year old patient who is seen today with a chief complaint of worsening tremor. She states in 2008 she had a similar problem with tremor and did have a neurological evaluation. She saw Dr. Anne Hahn at that time and evaluation included imaging studies of the brain. She has been under considerable stress her husband has become a much more confused recently he took money out of her retirement account and paid cash for a brand-new truck that they did not need. Since Sunday she has had increasing tremor involving primarily the hands she's also had a bit of a tremor involving her voice as well. She normally takes alprazolam one half milligram at bedtime. Her PCP has instructed her to take 1/2 mg 3 times daily as needed for her tremor; she has done this with some improvement. Medical records reviewed she did have some fairly recent laboratory studies that did reveal a normal TSH. Cholesterol was quite elevated with an LDL cholesterol in excess of 200. She has treated hypertension which remains well controlled   Review of Systems  Constitutional: Negative.   HENT: Negative for hearing loss, congestion, sore throat, rhinorrhea, dental problem, sinus pressure and tinnitus.   Eyes: Negative for pain, discharge and visual disturbance.  Respiratory: Negative for cough and shortness of breath.   Cardiovascular: Negative for chest pain, palpitations and leg swelling.  Gastrointestinal: Negative for nausea, vomiting, abdominal pain, diarrhea, constipation, blood in stool and abdominal distention.  Genitourinary: Negative for dysuria, urgency, frequency, hematuria, flank pain, vaginal bleeding, vaginal discharge, difficulty urinating, vaginal pain and pelvic pain.  Musculoskeletal: Negative for joint swelling, arthralgias and gait problem.  Skin: Negative for rash.  Neurological: Positive for tremors and speech  difficulty. Negative for dizziness, syncope, weakness, numbness and headaches.  Hematological: Negative for adenopathy.  Psychiatric/Behavioral: Negative for behavioral problems, dysphoric mood and agitation. The patient is nervous/anxious.        Objective:   Physical Exam  Constitutional: She is oriented to person, place, and time. She appears well-developed and well-nourished.  HENT:  Head: Normocephalic.  Right Ear: External ear normal.  Left Ear: External ear normal.  Mouth/Throat: Oropharynx is clear and moist.  Eyes: Conjunctivae and EOM are normal. Pupils are equal, round, and reactive to light.  Neck: Normal range of motion. Neck supple. No thyromegaly present.  Cardiovascular: Normal rate, regular rhythm, normal heart sounds and intact distal pulses.   Pulmonary/Chest: Effort normal and breath sounds normal.  Abdominal: Soft. Bowel sounds are normal. She exhibits no mass. There is no tenderness.  Musculoskeletal: Normal range of motion.  Lymphadenopathy:    She has no cervical adenopathy.  Neurological: She is alert and oriented to person, place, and time.       Patient had a significant tremor involving the hands there is also an intermittent mild voice tremor. Tremor was intermittent and at times briefly resolved  Skin: Skin is warm and dry. No rash noted.  Psychiatric: She has a normal mood and affect. Her behavior is normal.       Anxious          Assessment & Plan:   Anxiety disorder with worsening tremor. The patient will continue her alprazolam one half milligram 3 times daily and a prescription dispensed;  she was also given a prescription for sertraline to take once daily Hypertension well controlled Dyslipidemia. Her lipid status will be  discussed with her PCP next visit  Recheck in 2 weeks

## 2011-08-15 NOTE — Patient Instructions (Signed)
Increase the alprazolam to one half tablet 3-4 times daily Start sertraline 25 mg every morning  Return office visit in 2 weeks  Call or return to clinic prn if these symptoms worsen or fail to improve as anticipated.

## 2011-09-19 ENCOUNTER — Telehealth: Payer: Self-pay

## 2011-09-19 NOTE — Telephone Encounter (Signed)
Pt's husband called and stated that pt may have schizophrenia. Pt would like to talk with Dr. Scotty Court.

## 2011-09-24 NOTE — Telephone Encounter (Signed)
Dr. Scotty Court is aware.

## 2011-10-10 ENCOUNTER — Telehealth: Payer: Self-pay | Admitting: *Deleted

## 2011-10-10 NOTE — Telephone Encounter (Signed)
Calling to verify pt's xanax rx.   rx verified

## 2011-10-23 ENCOUNTER — Telehealth: Payer: Self-pay

## 2011-10-23 NOTE — Telephone Encounter (Signed)
Error

## 2011-10-26 ENCOUNTER — Other Ambulatory Visit: Payer: Self-pay | Admitting: Internal Medicine

## 2011-11-04 ENCOUNTER — Telehealth: Payer: Self-pay | Admitting: Family Medicine

## 2011-11-04 NOTE — Telephone Encounter (Signed)
Pulled from Triage vmail. Pt wanting a refill on Xanax sent to [facility] on [address]. Please call to advise if necessary.

## 2011-11-05 DIAGNOSIS — G459 Transient cerebral ischemic attack, unspecified: Secondary | ICD-10-CM

## 2011-11-05 HISTORY — DX: Transient cerebral ischemic attack, unspecified: G45.9

## 2011-11-06 NOTE — Telephone Encounter (Signed)
Pt is aware waiting on MD °

## 2011-11-08 ENCOUNTER — Emergency Department (HOSPITAL_COMMUNITY): Payer: Medicare Other

## 2011-11-08 ENCOUNTER — Encounter (HOSPITAL_COMMUNITY): Payer: Self-pay | Admitting: Emergency Medicine

## 2011-11-08 ENCOUNTER — Other Ambulatory Visit: Payer: Self-pay

## 2011-11-08 ENCOUNTER — Observation Stay (HOSPITAL_COMMUNITY)
Admission: EM | Admit: 2011-11-08 | Discharge: 2011-11-09 | Disposition: A | Discharge status: Home / Self Care | Payer: Medicare Other | Attending: Internal Medicine | Admitting: Internal Medicine

## 2011-11-08 DIAGNOSIS — E860 Dehydration: Secondary | ICD-10-CM | POA: Diagnosis present

## 2011-11-08 DIAGNOSIS — J4489 Other specified chronic obstructive pulmonary disease: Secondary | ICD-10-CM | POA: Insufficient documentation

## 2011-11-08 DIAGNOSIS — R112 Nausea with vomiting, unspecified: Secondary | ICD-10-CM | POA: Diagnosis present

## 2011-11-08 DIAGNOSIS — A088 Other specified intestinal infections: Principal | ICD-10-CM | POA: Insufficient documentation

## 2011-11-08 DIAGNOSIS — J449 Chronic obstructive pulmonary disease, unspecified: Secondary | ICD-10-CM | POA: Insufficient documentation

## 2011-11-08 DIAGNOSIS — K219 Gastro-esophageal reflux disease without esophagitis: Secondary | ICD-10-CM | POA: Insufficient documentation

## 2011-11-08 DIAGNOSIS — R251 Tremor, unspecified: Secondary | ICD-10-CM | POA: Diagnosis present

## 2011-11-08 DIAGNOSIS — R0602 Shortness of breath: Secondary | ICD-10-CM | POA: Insufficient documentation

## 2011-11-08 DIAGNOSIS — R42 Dizziness and giddiness: Secondary | ICD-10-CM | POA: Insufficient documentation

## 2011-11-08 DIAGNOSIS — E876 Hypokalemia: Secondary | ICD-10-CM | POA: Diagnosis present

## 2011-11-08 DIAGNOSIS — G250 Essential tremor: Secondary | ICD-10-CM | POA: Insufficient documentation

## 2011-11-08 DIAGNOSIS — N390 Urinary tract infection, site not specified: Secondary | ICD-10-CM | POA: Insufficient documentation

## 2011-11-08 DIAGNOSIS — Z8673 Personal history of transient ischemic attack (TIA), and cerebral infarction without residual deficits: Secondary | ICD-10-CM | POA: Insufficient documentation

## 2011-11-08 DIAGNOSIS — I2510 Atherosclerotic heart disease of native coronary artery without angina pectoris: Secondary | ICD-10-CM | POA: Insufficient documentation

## 2011-11-08 HISTORY — DX: Other specified postprocedural states: Z98.890

## 2011-11-08 HISTORY — DX: Encounter for other specified aftercare: Z51.89

## 2011-11-08 HISTORY — DX: Other specified postprocedural states: R11.2

## 2011-11-08 LAB — CBC
HCT: 37.5 % (ref 36.0–46.0)
Hemoglobin: 12.9 g/dL (ref 12.0–15.0)
MCH: 31.8 pg (ref 26.0–34.0)
MCHC: 34.4 g/dL (ref 30.0–36.0)
RDW: 12.8 % (ref 11.5–15.5)

## 2011-11-08 LAB — DIFFERENTIAL
Basophils Absolute: 0.1 10*3/uL (ref 0.0–0.1)
Basophils Relative: 1 % (ref 0–1)
Eosinophils Absolute: 0.2 10*3/uL (ref 0.0–0.7)
Lymphocytes Relative: 16 % (ref 12–46)
Lymphs Abs: 1.8 10*3/uL (ref 0.7–4.0)
Monocytes Absolute: 0.4 10*3/uL (ref 0.1–1.0)
Monocytes Relative: 4 % (ref 3–12)
Neutro Abs: 9.1 10*3/uL — ABNORMAL HIGH (ref 1.7–7.7)

## 2011-11-08 LAB — URINALYSIS, ROUTINE W REFLEX MICROSCOPIC
Glucose, UA: NEGATIVE mg/dL
Hgb urine dipstick: NEGATIVE
Ketones, ur: NEGATIVE mg/dL
Protein, ur: NEGATIVE mg/dL
pH: 8 (ref 5.0–8.0)

## 2011-11-08 LAB — BASIC METABOLIC PANEL
BUN: 25 mg/dL — ABNORMAL HIGH (ref 6–23)
Calcium: 11.4 mg/dL — ABNORMAL HIGH (ref 8.4–10.5)
Creatinine, Ser: 0.96 mg/dL (ref 0.50–1.10)
GFR calc Af Amer: 66 mL/min — ABNORMAL LOW (ref >90)
GFR calc non Af Amer: 57 mL/min — ABNORMAL LOW (ref >90)

## 2011-11-08 LAB — URINE MICROSCOPIC-ADD ON

## 2011-11-08 LAB — LIPASE, BLOOD: Lipase: 50 U/L (ref 11–59)

## 2011-11-08 LAB — MAGNESIUM: Magnesium: 2.3 mg/dL (ref 1.5–2.5)

## 2011-11-08 MED ORDER — ONDANSETRON HCL 4 MG/2ML IJ SOLN
4.0000 mg | Freq: Once | INTRAMUSCULAR | Status: AC
  Administered 2011-11-08: 4 mg via INTRAVENOUS
  Filled 2011-11-08: qty 2

## 2011-11-08 MED ORDER — PANTOPRAZOLE SODIUM 40 MG PO TBEC
40.0000 mg | DELAYED_RELEASE_TABLET | Freq: Every day | ORAL | Status: DC
  Administered 2011-11-09: 40 mg via ORAL
  Filled 2011-11-08: qty 1

## 2011-11-08 MED ORDER — SODIUM CHLORIDE 0.9 % IV SOLN
INTRAVENOUS | Status: AC
  Administered 2011-11-08: 15:00:00 via INTRAVENOUS

## 2011-11-08 MED ORDER — POTASSIUM CHLORIDE 10 MEQ/100ML IV SOLN
10.0000 meq | Freq: Once | INTRAVENOUS | Status: AC
  Administered 2011-11-08: 10 meq via INTRAVENOUS
  Filled 2011-11-08: qty 100

## 2011-11-08 MED ORDER — CITALOPRAM HYDROBROMIDE 40 MG PO TABS: 40.0000 mg | ORAL_TABLET | Freq: Every day | ORAL | Status: DC

## 2011-11-08 MED ORDER — ADULT MULTIVITAMIN W/MINERALS CH
1.0000 | ORAL_TABLET | Freq: Every day | ORAL | Status: DC
  Administered 2011-11-09 (×2): 1 via ORAL
  Filled 2011-11-08 (×2): qty 1

## 2011-11-08 MED ORDER — ALPRAZOLAM 0.5 MG PO TABS
1.0000 mg | ORAL_TABLET | Freq: Three times a day (TID) | ORAL | Status: DC | PRN
  Administered 2011-11-08 – 2011-11-09 (×3): 1 mg via ORAL
  Filled 2011-11-08 (×3): qty 2

## 2011-11-08 MED ORDER — ONDANSETRON HCL 4 MG/2ML IJ SOLN: 4.0000 mg | Freq: Four times a day (QID) | INTRAMUSCULAR | Status: DC | PRN

## 2011-11-08 MED ORDER — HYDROMORPHONE HCL PF 1 MG/ML IJ SOLN
0.5000 mg | INTRAMUSCULAR | Status: AC | PRN
  Administered 2011-11-08: 0.5 mg via INTRAVENOUS
  Filled 2011-11-08: qty 1

## 2011-11-08 MED ORDER — VALSARTAN-HYDROCHLOROTHIAZIDE 160-12.5 MG PO TABS: 1.0000 | ORAL_TABLET | Freq: Every day | ORAL | Status: DC

## 2011-11-08 MED ORDER — PROMETHAZINE HCL 25 MG PO TABS: 25.0000 mg | ORAL_TABLET | Freq: Four times a day (QID) | ORAL | Status: AC | PRN

## 2011-11-08 MED ORDER — ACETAMINOPHEN 325 MG PO TABS
650.0000 mg | ORAL_TABLET | Freq: Once | ORAL | Status: AC
  Administered 2011-11-08: 650 mg via ORAL
  Filled 2011-11-08: qty 2

## 2011-11-08 MED ORDER — HYDROMORPHONE HCL PF 1 MG/ML IJ SOLN
0.5000 mg | Freq: Once | INTRAMUSCULAR | Status: AC
  Administered 2011-11-08: 0.5 mg via INTRAVENOUS
  Filled 2011-11-08: qty 1

## 2011-11-08 MED ORDER — OLMESARTAN MEDOXOMIL 20 MG PO TABS
20.0000 mg | ORAL_TABLET | Freq: Every day | ORAL | Status: DC
  Administered 2011-11-08 – 2011-11-09 (×2): 20 mg via ORAL
  Filled 2011-11-08 (×2): qty 1

## 2011-11-08 MED ORDER — SODIUM CHLORIDE 0.9 % IV SOLN
INTRAVENOUS | Status: DC
  Administered 2011-11-08 – 2011-11-09 (×3): via INTRAVENOUS

## 2011-11-08 MED ORDER — HYDROCHLOROTHIAZIDE 12.5 MG PO CAPS
12.5000 mg | ORAL_CAPSULE | Freq: Every day | ORAL | Status: DC
  Administered 2011-11-09 (×2): 12.5 mg via ORAL
  Filled 2011-11-08 (×2): qty 1

## 2011-11-08 MED ORDER — FUROSEMIDE 40 MG PO TABS
40.0000 mg | ORAL_TABLET | Freq: Two times a day (BID) | ORAL | Status: DC
  Administered 2011-11-09 (×2): 40 mg via ORAL
  Filled 2011-11-08 (×3): qty 1

## 2011-11-08 MED ORDER — PROMETHAZINE HCL 25 MG RE SUPP: 25.0000 mg | Freq: Four times a day (QID) | RECTAL | Status: AC | PRN

## 2011-11-08 MED ORDER — SERTRALINE HCL 25 MG PO TABS
25.0000 mg | ORAL_TABLET | Freq: Every day | ORAL | Status: DC
  Administered 2011-11-09: 25 mg via ORAL
  Filled 2011-11-08 (×2): qty 1

## 2011-11-08 MED ORDER — SODIUM CHLORIDE 0.9 % IV SOLN
Freq: Once | INTRAVENOUS | Status: AC
  Administered 2011-11-08: 08:00:00 via INTRAVENOUS

## 2011-11-08 MED ORDER — LEVOFLOXACIN 500 MG PO TABS
500.0000 mg | ORAL_TABLET | Freq: Every day | ORAL | Status: DC
  Administered 2011-11-09 (×2): 500 mg via ORAL
  Filled 2011-11-08 (×2): qty 1

## 2011-11-08 MED ORDER — ASPIRIN EC 81 MG PO TBEC
81.0000 mg | DELAYED_RELEASE_TABLET | Freq: Every day | ORAL | Status: DC
  Administered 2011-11-09 (×2): 81 mg via ORAL
  Filled 2011-11-08 (×2): qty 1

## 2011-11-08 MED ORDER — LORAZEPAM 1 MG PO TABS
0.5000 mg | ORAL_TABLET | Freq: Once | ORAL | Status: AC
  Administered 2011-11-08: 0.5 mg via ORAL
  Filled 2011-11-08: qty 1

## 2011-11-08 MED ORDER — ALPRAZOLAM 1 MG PO TABS: 1.0000 mg | ORAL_TABLET | Freq: Three times a day (TID) | ORAL | Status: AC | PRN

## 2011-11-08 MED ORDER — ONDANSETRON HCL 4 MG/2ML IJ SOLN
4.0000 mg | Freq: Three times a day (TID) | INTRAMUSCULAR | Status: AC | PRN
  Administered 2011-11-08 (×2): 4 mg via INTRAVENOUS
  Filled 2011-11-08 (×3): qty 2

## 2011-11-08 MED ORDER — ONDANSETRON HCL 4 MG PO TABS: 4.0000 mg | ORAL_TABLET | ORAL | Status: DC | PRN

## 2011-11-08 MED ORDER — FUROSEMIDE 40 MG PO TABS: 40.0000 mg | ORAL_TABLET | Freq: Two times a day (BID) | ORAL | Status: DC

## 2011-11-08 MED ORDER — CELECOXIB 200 MG PO CAPS
200.0000 mg | ORAL_CAPSULE | Freq: Two times a day (BID) | ORAL | Status: DC
  Administered 2011-11-09 (×2): 200 mg via ORAL
  Filled 2011-11-08 (×3): qty 1

## 2011-11-08 MED ORDER — FAMOTIDINE 20 MG PO TABS
20.0000 mg | ORAL_TABLET | Freq: Once | ORAL | Status: AC
  Administered 2011-11-08: 20 mg via ORAL
  Filled 2011-11-08: qty 1

## 2011-11-08 MED ORDER — ACETAMINOPHEN 325 MG PO TABS
650.0000 mg | ORAL_TABLET | Freq: Four times a day (QID) | ORAL | Status: DC | PRN
Start: 2011-11-08 — End: 2011-11-09

## 2011-11-08 MED ORDER — ONDANSETRON HCL 4 MG/2ML IJ SOLN
4.0000 mg | INTRAMUSCULAR | Status: DC | PRN
  Administered 2011-11-08 – 2011-11-09 (×2): 4 mg via INTRAVENOUS
  Filled 2011-11-08: qty 2

## 2011-11-08 NOTE — ED Notes (Signed)
PT. REPORTS VOMITTING ONSET LAST NIGHT , NO DIARRHEA ,  SOB WITH NO COUGH , DENIES CHEST PAIN .

## 2011-11-08 NOTE — ED Notes (Signed)
Pt grandson called and reports that pt frequently has anxiety

## 2011-11-08 NOTE — ED Provider Notes (Signed)
Medical screening examination/treatment/procedure(s) were conducted as a shared visit with non-physician practitioner(s) and myself.  I personally evaluated the patient during the encounter  Pt with mild low grade elevated temp, rigors.  Persistent N/V despite IVF's and several antiemetics.  Will admit for rehydration and observation to [facility].  abd is soft, no guard or rebound.  Troponin was normal.    Gavin Pound. Lacrisha Bielicki, MD 11/08/11 1191

## 2011-11-08 NOTE — ED Provider Notes (Signed)
History     CSN: [id number]  Arrival date & time 11/08/11  0554   First MD Initiated Contact with Patient 11/08/11 [phone]      Chief Complaint  Patient presents with  . Emesis    (Consider location/radiation/quality/duration/timing/severity/associated sxs/prior treatment) HPI History is obtained from the patient. She presents with chief complaint of emesis. States that she began to have vomiting last evening around 8 PM, and has been vomiting frequently throughout the night. She denies change in bowel movements or fever. Last BM was yesterday, was nl for her. +flatus. Denies abdominal or chest pain. She admits feeling short of breath but denies diaphoresis. She has not had syncope. She does have a past medical history of an esophageal hiatal hernia and GERD, for which she takes Nexium daily.  She also has had "shaking" which has started with the vomiting. States that she has a resting tremor in both hands, which has been present for some time, but she's never had tremors like this before. States that her right leg and arm feel worse than the left. Admits some dizziness as well. Denies ear pain, tinnitus.  Past Medical History  Diagnosis Date  . CAD (coronary artery disease)   . Hyperlipidemia   . TIA (transient ischemic attack)   . Hypertension   . COPD (chronic obstructive pulmonary disease)   . Depression   . Thyroid disease   . Anemia   . Arthritis   . Dyspnea   . Lumbago   . Fecal occult blood test positive   . Rectal bleeding   . SOB (shortness of breath)   . Internal hemorrhoids   . Esophageal stricture   . Hiatal hernia   . GERD (gastroesophageal reflux disease)   . Anxiety   . Spondylosis   . Mitral regurgitation     Past Surgical History  Procedure Date  . Total knee arthroplasty   . Lumbar laminectomy   . Abdominal hysterectomy   . Tonsillectomy   . Spine surgery   . Appendectomy     Family History  Problem Relation Age of Onset  . Kidney disease Mother    . Heart disease Father     History  Substance Use Topics  . Smoking status: Never Smoker   . Smokeless tobacco: Never Used  . Alcohol Use: No    OB History    Grav Para Term Preterm Abortions TAB SAB Ect Mult Living                  Review of Systems  Constitutional: Positive for chills. Negative for fever, activity change, appetite change and fatigue.  HENT: Negative for ear pain, congestion, sore throat, rhinorrhea and neck pain.   Respiratory: Positive for shortness of breath. Negative for cough, chest tightness and wheezing.   Cardiovascular: Negative for chest pain, palpitations and leg swelling.  Gastrointestinal: Positive for nausea and vomiting. Negative for abdominal pain, diarrhea, constipation, blood in stool, abdominal distention, anal bleeding and rectal pain.  Genitourinary: Negative for dysuria, urgency, decreased urine volume and difficulty urinating.  Musculoskeletal: Negative for myalgias.  Skin: Negative for color change and rash.  Neurological: Positive for dizziness. Negative for speech difficulty, weakness, light-headedness, numbness and headaches.    Allergies  Metaxalone and Morphine  Home Medications   Current Outpatient Rx  Name Route Sig Dispense Refill  . ACETAMINOPHEN 325 MG PO TABS Oral Take 650 mg by mouth every 6 (six) hours as needed.      . ALPRAZOLAM  1 MG PO TABS Oral Take 1 tablet (1 mg total) by mouth 3 (three) times daily as needed for sleep or anxiety. 90 tablet 5  . ASPIRIN 81 MG PO TABS Oral Take 81 mg by mouth daily.      Marland Kitchen BENZONATATE 100 MG PO CAPS Oral Take 100 mg by mouth 2 (two) times daily as needed.      . CELECOXIB 200 MG PO CAPS Oral Take 1 capsule (200 mg total) by mouth 2 (two) times daily. 60 capsule 11  . CITALOPRAM HYDROBROMIDE 40 MG PO TABS Oral Take 1 tablet (40 mg total) by mouth daily. 30 tablet 11  . ESOMEPRAZOLE MAGNESIUM 40 MG PO CPDR  1 cap bid for severe gerd 60 capsule 11  . FAMOTIDINE 20 MG PO TABS Oral  Take 20 mg by mouth at bedtime as needed.      . FUROSEMIDE 80 MG PO TABS Oral Take 1 tablet (80 mg total) by mouth 2 (two) times daily. 60 tablet 11  . ONE-DAILY MULTI VITAMINS PO TABS Oral Take 1 tablet by mouth daily.      . SERTRALINE HCL 25 MG PO TABS  TAKE 1 TABLET BY MOUTH AT BEDTIME 60 tablet 0  . VALSARTAN-HYDROCHLOROTHIAZIDE 160-12.5 MG PO TABS Oral Take 1 tablet by mouth daily. 30 tablet 11    BP 148/94  Pulse 78  Temp(Src) 97.5 F (36.4 C) (Oral)  Resp 21  SpO2 100%  Physical Exam  Nursing note and vitals reviewed. Constitutional: She is oriented to person, place, and time. She appears well-developed and well-nourished. No distress.  HENT:  Head: Normocephalic and atraumatic.  Mouth/Throat: Oropharynx is clear and moist. No oropharyngeal exudate.  Eyes: Conjunctivae and EOM are normal.  Neck: Normal range of motion.  Cardiovascular: Normal rate, regular rhythm and normal heart sounds.   Pulmonary/Chest: Effort normal and breath sounds normal. No respiratory distress. She has no wheezes. She has no rales. She exhibits no tenderness.  Abdominal: Soft. Bowel sounds are normal. She exhibits no distension and no mass. There is no tenderness. There is no rebound and no guarding.  Musculoskeletal: Normal range of motion.  Neurological: She is alert and oriented to person, place, and time. She has normal reflexes. She exhibits normal muscle tone.       Jerking movements to upper and lower extremities noted  Skin: Skin is warm and dry. No rash noted. She is not diaphoretic.  Psychiatric: She has a normal mood and affect.    ED Course  Procedures (including critical care time)   Date: 11/08/2011  Rate: 87  Rhythm: normal sinus rhythm and premature ventricular contractions (PVC)  QRS Axis: normal  Intervals: normal  ST/T Wave abnormalities: normal  Conduction Disutrbances:none  Narrative Interpretation:   Old EKG Reviewed: As compared with 02/2009 no significant  changes   Labs Reviewed  CBC - Abnormal; Notable for the following:    WBC 11.6 (*)    All other components within normal limits  DIFFERENTIAL - Abnormal; Notable for the following:    Neutrophils Relative 79 (*)    Neutro Abs 9.1 (*)    All other components within normal limits  BASIC METABOLIC PANEL - Abnormal; Notable for the following:    Potassium 3.2 (*)    Chloride 93 (*)    Glucose, Bld 167 (*)    BUN 25 (*)    Calcium 11.4 (*)    GFR calc non Af Amer 57 (*)    GFR calc  Af Amer 66 (*)    All other components within normal limits   Dg Chest 2 View  11/08/2011  *RADIOLOGY REPORT*  Clinical Data: Shortness of breath, nausea, vomiting, abdominal pain  CHEST - 2 VIEW  Comparison: 11/15/2010  Findings: The heart size and pulmonary vascularity are normal. The lungs appear clear and expanded without focal air space disease or consolidation. No blunting of the costophrenic angles.  Esophageal hiatal hernia behind the heart.  Calcification of the aorta.  No significant change since the previous study.  IMPRESSION: No evidence of active pulmonary disease.  Esophageal hiatal hernia behind the heart.  Original Report Authenticated By: Marlon Pel, M.D.     1. Nausea and vomiting   2. Hypercalcemia       MDM  7:45 AM Patient assessed. Her electrolytes appear consistent with metabolic alkalosis, which is likely stemming from her vomiting. Plan to give fluids, replete K. Her chest x-ray was unremarkable other than an esophageal hiatal hernia. ECG reviewed and appears unremarkable as compared with previous.  9:08 AM Pt rechecked. States nausea and tremor improving. Abd soft, NT, nondistended. Will send for abd films and u/a. Pt does not feel ready for PO trial yet.  11:02 AM Pt feels improved. Still noted to be slightly tremulous. Rectal temp 99. Discussed with Dr. Oletta Lamas, who saw the pt. Will plan to give Tylenol to see if this improves. Abd xray nl. U/A shows leuks, few  bacteria.  1:00 PM Pt was continuing to have tremors but stated she was feeling better. We had discussed sending her home with close follow up with primary care. Staff got her up and she began to vomit and was more tremulous. As she has had 2 rounds of antiemetic and continues to be symptomatic, will admit for observation. Page placed to [facility].  Grant Fontana, [place] 11/08/11 1615

## 2011-11-08 NOTE — ED Notes (Signed)
Pt reports getting nauseated again and states vomited once.

## 2011-11-08 NOTE — ED Notes (Signed)
[id number] Ready

## 2011-11-08 NOTE — H&P (Signed)
PCP:  Franciso Bend, MD   DOA:  11/08/2011  6:14 AM  Chief Complaint:  Nausea and vomiting x 1 day  HPI:  76 year old female with history of hyperlipidemia, TIA, chronic back pain, hypertension, anxiety, chronic progressive tremors who presented to the ED with symptoms of persistent nausea with vomiting since last night. It was in her usual state of health and ate some home-cooked spaghetti last evening and about 2 hours following that started having multiple episodes of vomiting associated with nausea. She informs vomiting almost every 15-20 minutes. Denies any hematemesis. She informs having some epigastric discomfort following persistent vomiting. Denies any fevers or chills. Denies any diarrhea. His eating anything outside or having any sick contacts. Denies any new medications recent travel. Denies any chest pain palpitations shortness of breath. She was given IV fluids and IV Zofran in the ED following which her and vomiting improved. Does plan to be discharged however on trying to get out she felt very weak and also with increased tremors. She is being admitted for observation overnight.  Allergies: Allergies  Allergen Reactions  . Metaxalone   . Morphine     Prior to Admission medications   Medication Sig Start Date End Date Taking? Authorizing Provider  acetaminophen (TYLENOL) 325 MG tablet Take 650 mg by mouth every 6 (six) hours as needed. For pain   Yes Historical Provider, MD  ALPRAZolam (XANAX) 1 MG tablet Take 1 tablet (1 mg total) by mouth 3 (three) times daily as needed for sleep or anxiety. 08/15/11 08/14/12 Yes Peter Charissa Bash  aspirin 81 MG tablet Take 81 mg by mouth daily.     Yes Historical Provider, MD  celecoxib (CELEBREX) 200 MG capsule Take 1 capsule (200 mg total) by mouth 2 (two) times daily. 04/17/11 04/16/12 Yes Willie Ransom Alphonzo Severance.  citalopram (CELEXA) 40 MG tablet Take 1 tablet (40 mg total) by mouth daily. 04/17/11 04/16/12 Yes Willie  Ransom Stafford Jr.  esomeprazole (NEXIUM) 40 MG capsule 1 cap bid for severe gerd 04/17/11  Yes Willie Ransom Alphonzo Severance.  furosemide (LASIX) 40 MG tablet Take 40 mg by mouth 2 (two) times daily.     Yes Historical Provider, MD  Multiple Vitamin (MULTIVITAMIN) tablet Take 1 tablet by mouth daily.     Yes Historical Provider, MD  sertraline (ZOLOFT) 25 MG tablet TAKE 1 TABLET BY MOUTH AT BEDTIME 10/26/11  Yes Rogelia Boga  valsartan-hydrochlorothiazide (DIOVAN-HCT) 160-12.5 MG per tablet Take 1 tablet by mouth daily. 04/17/11 04/16/12 Yes Willie Minus Breeding.  ALPRAZolam (XANAX) 1 MG tablet Take 1 tablet (1 mg total) by mouth 3 (three) times daily as needed for sleep or anxiety. 11/08/11 12/08/11  Grant Fontana, PA  furosemide (LASIX) 40 MG tablet Take 1 tablet (40 mg total) by mouth 2 (two) times daily. 11/08/11 11/07/12  Grant Fontana, PA  promethazine (PHENERGAN) 25 MG suppository Place 1 suppository (25 mg total) rectally every 6 (six) hours as needed for nausea. 11/08/11 11/15/11  Grant Fontana, PA  promethazine (PHENERGAN) 25 MG tablet Take 1 tablet (25 mg total) by mouth every 6 (six) hours as needed for nausea. 11/08/11 11/15/11  Grant Fontana, PA    Past Medical History  Diagnosis Date  . CAD (coronary artery disease)   . Hyperlipidemia   . TIA (transient ischemic attack)   . Hypertension   . COPD (chronic obstructive pulmonary disease)   . Depression   . Thyroid disease   . Anemia   . Arthritis   .  Dyspnea   . Lumbago   . Fecal occult blood test positive   . Rectal bleeding   . SOB (shortness of breath)   . Internal hemorrhoids   . Esophageal stricture   . Hiatal hernia   . GERD (gastroesophageal reflux disease)   . Anxiety   . Spondylosis   . Mitral regurgitation     Past Surgical History  Procedure Date  . Total knee arthroplasty   . Lumbar laminectomy   . Abdominal hysterectomy   . Tonsillectomy   . Spine surgery   . Appendectomy      Social History:  reports that she has never smoked. She has never used smokeless tobacco. She reports that she does not drink alcohol or use illicit drugs.  Family History  Problem Relation Age of Onset  . Kidney disease Mother   . Heart disease Father     Review of Systems:  Constitutional: Denies fever, chills, diaphoresis, appetite change and fatigue.  HEENT: Denies photophobia, eye pain, redness, hearing loss, ear pain, congestion, sore throat, rhinorrhea, sneezing, mouth sores, trouble swallowing, neck pain, neck stiffness and tinnitus.   Respiratory: Denies SOB, DOE, cough, chest tightness,  and wheezing.   Cardiovascular: Denies chest pain, palpitations and leg swelling.  Gastrointestinal: Positive for nausea, vomiting, abdominal pain, denies diarrhea, constipation, blood in stool and abdominal distention.  Genitourinary: Denies dysuria, urgency, frequency, hematuria, flank pain and difficulty urinating.  Musculoskeletal: Denies myalgias, back pain, joint swelling, arthralgias and gait problem.  Skin: Denies pallor, rash and wound.  Neurological: Denies dizziness, seizures, syncope, weakness, light-headedness, numbness and headaches.  has chronic tremors. Has been having diffuse jerky movements of all extremities in the ED. Hematological: Denies adenopathy. Easy bruising, personal or family bleeding history  Psychiatric/Behavioral: Denies suicidal ideation, mood changes, confusion, nervousness, sleep disturbance and agitation   Physical Exam:  Filed Vitals:   11/08/11 1430 11/08/11 1457 11/08/11 1515 11/08/11 1606  BP: 151/75 151/75 142/65 156/95  Pulse: 77 81 79 85  Temp:      TempSrc:      Resp: 16 20  20   SpO2: 96% 97% 97% 99%    Constitutional: Vital signs reviewed.  Patient is a well-developed and well-nourished in no acute distress and cooperative with exam. Alert and oriented x3.  HEENT: No pallor, no icterus, dry oral mucosa, no lymphadenopathy   Cardiovascular: RRR, S1 normal, S2 normal, no MRG, pulses symmetric and intact bilaterally Pulmonary/Chest: CTAB, no wheezes, rales, or rhonchi Abdominal: Soft. Mild Tenderness on palpation over epigastric area non-distended, bowel sounds are normal, no masses, organomegaly, or guarding present.  GU: no CVA tenderness Musculoskeletal: No joint deformities, erythema, or stiffness, ROM full and no nontender Ext: no edema and no cyanosis, pulses palpable bilaterally (DP and PT) Hematology: no cervical, inginal, or axillary adenopathy.  Neurological: A&O x3, Strenght is normal and symmetric bilaterally, cranial nerve II-XII are grossly intact, no focal motor deficit, sensory intact to light touch bilaterally.  coarse tremors noted over both hands more prominent at rest along with twitching movements of all extremities Skin: Warm, dry and intact. No rash, cyanosis, or clubbing.  Psychiatric: Normal mood and affect. speech and behavior is normal. Judgment and thought content normal. Cognition and memory are normal.   Labs on Admission:  Results for orders placed during the hospital encounter of 11/08/11 (from the past 48 hour(s))  CBC     Status: Abnormal   Collection Time   11/08/11  6:25 AM  Component Value Range Comment   WBC 11.6 (*) 4.0 - 10.5 (K/uL)    RBC 4.06  3.87 - 5.11 (MIL/uL)    Hemoglobin 12.9  12.0 - 15.0 (g/dL)    HCT 16.1  09.6 - 04.5 (%)    MCV 92.4  78.0 - 100.0 (fL)    MCH 31.8  26.0 - 34.0 (pg)    MCHC 34.4  30.0 - 36.0 (g/dL)    RDW 40.9  81.1 - 91.4 (%)    Platelets 282  150 - 400 (K/uL)   DIFFERENTIAL     Status: Abnormal   Collection Time   11/08/11  6:25 AM      Component Value Range Comment   Neutrophils Relative 79 (*) 43 - 77 (%)    Neutro Abs 9.1 (*) 1.7 - 7.7 (K/uL)    Lymphocytes Relative 16  12 - 46 (%)    Lymphs Abs 1.8  0.7 - 4.0 (K/uL)    Monocytes Relative 4  3 - 12 (%)    Monocytes Absolute 0.4  0.1 - 1.0 (K/uL)    Eosinophils Relative 2  0 - 5  (%)    Eosinophils Absolute 0.2  0.0 - 0.7 (K/uL)    Basophils Relative 1  0 - 1 (%)    Basophils Absolute 0.1  0.0 - 0.1 (K/uL)   BASIC METABOLIC PANEL     Status: Abnormal   Collection Time   11/08/11  6:25 AM      Component Value Range Comment   Sodium 137  135 - 145 (mEq/L)    Potassium 3.2 (*) 3.5 - 5.1 (mEq/L)    Chloride 93 (*) 96 - 112 (mEq/L)    CO2 30  19 - 32 (mEq/L)    Glucose, Bld 167 (*) 70 - 99 (mg/dL)    BUN 25 (*) 6 - 23 (mg/dL)    Creatinine, Ser 7.82  0.50 - 1.10 (mg/dL)    Calcium 95.6 (*) 8.4 - 10.5 (mg/dL)    GFR calc non Af Amer 57 (*) >90 (mL/min)    GFR calc Af Amer 66 (*) >90 (mL/min)   LIPASE, BLOOD     Status: Normal   Collection Time   11/08/11  6:25 AM      Component Value Range Comment   Lipase 50  11 - 59 (U/L)   POCT I-STAT TROPONIN I     Status: Normal   Collection Time   11/08/11  7:56 AM      Component Value Range Comment   Troponin i, poc 0.01  0.00 - 0.08 (ng/mL)    Comment 3            URINALYSIS, ROUTINE W REFLEX MICROSCOPIC     Status: Abnormal   Collection Time   11/08/11  8:45 AM      Component Value Range Comment   Color, Urine YELLOW  YELLOW     APPearance CLEAR  CLEAR     Specific Gravity, Urine 1.016  1.005 - 1.030     pH 8.0  5.0 - 8.0     Glucose, UA NEGATIVE  NEGATIVE (mg/dL)    Hgb urine dipstick NEGATIVE  NEGATIVE     Bilirubin Urine NEGATIVE  NEGATIVE     Ketones, ur NEGATIVE  NEGATIVE (mg/dL)    Protein, ur NEGATIVE  NEGATIVE (mg/dL)    Urobilinogen, UA 0.2  0.0 - 1.0 (mg/dL)    Nitrite NEGATIVE  NEGATIVE     Leukocytes, UA MODERATE (*)  NEGATIVE    URINE MICROSCOPIC-ADD ON     Status: Abnormal   Collection Time   11/08/11  8:45 AM      Component Value Range Comment   Squamous Epithelial / LPF RARE  RARE     WBC, UA 3-6  <3 (WBC/hpf)    Bacteria, UA FEW (*) RARE     Urine-Other MUCOUS PRESENT       Radiological Exams on Admission: No results found.  Assessment/Plan 76 year old female with history off hypertension  carotid artery disease, TIA chronic tremor is GERD chronic back pain hypothyroidism who presented to the hospital with acute onset of nausea and vomiting since last night likely in the setting of viral gastroenteritis patient admitted under observation overnight for persistence of symptoms and dehydration.  Plan  #1 acute gastroenteritis Likely viral in etiology Daily with IV hydration prn Zofran Continue PPI Noted for low potassium which will be replenished. Added magnesium level Monitor lytes in the morning She is noted to have UTI on UA. Will sent urine cx and treat her with a course of po levaquin  Tremors with a generalized twitching Chronic tremor is which seemed to be worsened on presentation and also has unexplained generalized twitching For low potassium which would be replenished. Does have mildly elevated calcium which I will monitor after IV hydration and morning She follows up with her primary care regarding her tremors and  had a workup done as outpatient. As per PCP note from November last year or tremor seemed to get worse with anxiety and she's currently on alprazolam  History of TIA Continue aspirin  Hypertension Continue home medications  History of anxiety Home medications  Time Spent on Admission: Full code  Abeera Flannery 11/08/2011, 5:19 PM

## 2011-11-08 NOTE — ED Notes (Addendum)
Upon discharging pt, pt began having worsening shaking and pt began to get more nauseated and threw up.  PA notified, states she is going to see about getting pt admitted.

## 2011-11-09 ENCOUNTER — Encounter (HOSPITAL_COMMUNITY): Payer: Self-pay | Admitting: General Practice

## 2011-11-09 DIAGNOSIS — E876 Hypokalemia: Secondary | ICD-10-CM | POA: Diagnosis present

## 2011-11-09 LAB — BASIC METABOLIC PANEL
Calcium: 9.3 mg/dL (ref 8.4–10.5)
Glucose, Bld: 99 mg/dL (ref 70–99)
Potassium: 2.8 mEq/L — ABNORMAL LOW (ref 3.5–5.1)
Sodium: 143 mEq/L (ref 135–145)

## 2011-11-09 LAB — POTASSIUM: Potassium: 4.6 mEq/L (ref 3.5–5.1)

## 2011-11-09 MED ORDER — POTASSIUM CHLORIDE CRYS ER 20 MEQ PO TBCR
40.0000 meq | EXTENDED_RELEASE_TABLET | Freq: Two times a day (BID) | ORAL | Status: DC
  Administered 2011-11-09: 40 meq via ORAL
  Filled 2011-11-09 (×4): qty 2

## 2011-11-09 MED ORDER — LEVOFLOXACIN 500 MG PO TABS: 500.0000 mg | ORAL_TABLET | Freq: Every day | ORAL | Status: AC

## 2011-11-09 MED ORDER — LEVOFLOXACIN 500 MG PO TABS: 500.0000 mg | ORAL_TABLET | Freq: Every day | ORAL | Status: DC

## 2011-11-09 MED ORDER — POTASSIUM CHLORIDE 10 MEQ/100ML IV SOLN
10.0000 meq | INTRAVENOUS | Status: AC
  Administered 2011-11-09 (×4): 10 meq via INTRAVENOUS
  Filled 2011-11-09 (×5): qty 100

## 2011-11-09 MED ORDER — BOOST / RESOURCE BREEZE PO LIQD
1.0000 | Freq: Three times a day (TID) | ORAL | Status: DC
  Administered 2011-11-09: 1 via ORAL

## 2011-11-09 NOTE — Discharge Summary (Signed)
Patient ID: Julie Rice MRN: [id number] DOB/AGE: [birth year] 74 y.o.  Admit date: 11/08/2011 Discharge date: 11/09/2011  Primary Care Physician:  STAFFORD Glean Hess, MD  Discharge Diagnoses:    Present on Admission:  .Nausea and vomiting secondary to viral gastroenteritis .Dehydration .Tremors of nervous system .Hypokalemia . UTI    Current Discharge Medication List    START taking these medications   Details     Levofloxacin tab                                        500 mg tab ; 1 tab po daily for 2 days                     CONTINUE these medications which have NOT CHANGED   Details  acetaminophen (TYLENOL) 325 MG tablet Take 650 mg by mouth every 6 (six) hours as needed. For pain    !! ALPRAZolam (XANAX) 1 MG tablet Take 1 tablet (1 mg total) by mouth 3 (three) times daily as needed for sleep or anxiety. Qty: 90 tablet, Refills: 5    aspirin 81 MG tablet Take 81 mg by mouth daily.      celecoxib (CELEBREX) 200 MG capsule Take 1 capsule (200 mg total) by mouth 2 (two) times daily. Qty: 60 capsule, Refills: 11    citalopram (CELEXA) 40 MG tablet Take 1 tablet (40 mg total) by mouth daily. Qty: 30 tablet, Refills: 11    esomeprazole (NEXIUM) 40 MG capsule 1 cap bid for severe gerd Qty: 60 capsule, Refills: 11     furosemide (LASIX) 40 MG tablet Take 40 mg by mouth 2 (two) times daily.      Multiple Vitamin (MULTIVITAMIN) tablet Take 1 tablet by mouth daily.      sertraline (ZOLOFT) 25 MG tablet TAKE 1 TABLET BY MOUTH AT BEDTIME Qty: 60 tablet, Refills: 0    valsartan-hydrochlorothiazide (DIOVAN-HCT) 160-12.5 MG per tablet Take 1 tablet by mouth daily. Qty: 30 tablet, Refills: 11          Disposition and Follow-up:   Home. patient in the process of getting new PCP as her  PCP recently  retired. Also needs to set up a neurological evaluation as outpt.   Consults:   none  Significant Diagnostic Studies:  Dg Chest 2 View  11/08/2011   *RADIOLOGY REPORT*  Clinical Data: Shortness of breath, nausea, vomiting, abdominal pain  CHEST - 2 VIEW  Comparison: 11/15/2010  Findings: The heart size and pulmonary vascularity are normal. The lungs appear clear and expanded without focal air space disease or consolidation. No blunting of the costophrenic angles.  Esophageal hiatal hernia behind the heart.  Calcification of the aorta.  No significant change since the previous study.  IMPRESSION: No evidence of active pulmonary disease.  Esophageal hiatal hernia behind the heart.  Original Report Authenticated By: Marlon Pel, M.D.   Dg Abd 2 Views  11/08/2011  *RADIOLOGY REPORT*  Clinical Data: Abdominal pain, nausea, vomiting  ABDOMEN - 2 VIEW  Comparison:  CT  lumbar spine scout film of 09/30/2008.  Findings:  Hardware for posterior fusion from L2-S1 appears unchanged.  Ray cages are noted at the L2-3 and L3-4 levels.  The bones are osteopenic.  The bowel gas pattern is nonspecific.  No bowel obstruction is seen and no free air is noted.  No opaque calculi are seen  IMPRESSION: 1.  No bowel obstruction.  No free air.  2.  Stable posterior fusion from L2-S1.  Degenerative change at T12-L1 and L1-L2.  Original Report Authenticated By: Juline Patch, M.D.    Brief H and P: For complete details please refer to admission H and P, but in brief 76 year old female with history of hyperlipidemia, TIA, chronic back pain, hypertension, anxiety, chronic progressive tremors who presented to the ED with symptoms of persistent nausea with vomiting since last night. It was in her usual state of health and ate some home-cooked spaghetti last evening and about 2 hours following that started having multiple episodes of vomiting associated with nausea. She informs vomiting almost every 15-20 minutes. Denies any hematemesis. She informs having some epigastric discomfort following persistent vomiting. Denies any fevers or chills. Denies any diarrhea. His eating  anything outside or having any sick contacts. Denies any new medications recent travel. Denies any chest pain palpitations shortness of breath. She was given IV fluids and IV Zofran in the ED following which her and vomiting improved. Does plan to be discharged however on trying to get out she felt very weak and also with increased tremors. She is being admitted for observation overnight   Physical Exam on Discharge:  Filed Vitals:   11/09/11 0051 11/09/11 0327 11/09/11 0632 11/09/11 1412  BP:  157/87 153/87 133/77  Pulse:  83 87 75  Temp:  98.3 F (36.8 C) 97.8 F (36.6 C) 98.3 F (36.8 C)  TempSrc:  Oral Oral Oral  Resp:  20 20 18   Height: 5\' 7"  (1.702 m)     Weight: 81.647 kg (180 lb)     SpO2:  98% 97% 94%     Intake/Output Summary (Last 24 hours) at 11/09/11 1700 Last data filed at 11/09/11 1451  Gross per 24 hour  Intake   2230 ml  Output    250 ml  Net   1980 ml    General: Alert, awake, oriented x3, in no acute distress. HEENT: No bruits, no goiter. Heart: Regular rate and rhythm, without murmurs, rubs, gallops. Lungs: Clear to auscultation bilaterally. Abdomen: Soft, nontender, nondistended, positive bowel sounds. Extremities: No clubbing cyanosis or edema with positive pedal pulses. Neuro: Grossly intact, nonfocal. resting tremors more pronounced with activity  CBC:    Component Value Date/Time   WBC 11.6* 11/08/2011 0625   HGB 12.9 11/08/2011 0625   HCT 37.5 11/08/2011 0625   PLT 282 11/08/2011 0625   MCV 92.4 11/08/2011 0625   NEUTROABS 9.1* 11/08/2011 0625   LYMPHSABS 1.8 11/08/2011 0625   MONOABS 0.4 11/08/2011 0625   EOSABS 0.2 11/08/2011 0625   BASOSABS 0.1 11/08/2011 0625    Basic Metabolic Panel:    Component Value Date/Time   NA 143 11/09/2011 0650   K 2.8* 11/09/2011 0650   CL 102 11/09/2011 0650   CO2 30 11/09/2011 0650   BUN 16 11/09/2011 0650   CREATININE 0.86 11/09/2011 0650   GLUCOSE 99 11/09/2011 0650   CALCIUM 9.3 11/09/2011 0650   k on 1/5 at 5 pm --4.6    Hospital Course:  #1 acute gastroenteritis  Likely viral in etiology  Admitted to observation  And given IV fluids and zofran Continue PPI  Noted for low potassium which was replenished..repeat k level  on discharge is 4.6 Her N/V now resolved and has been tolerating diet  UTI She is noted to have UTI on UA. Will sent urine cx and treat her with a course of  po levaquin   Tremors with a generalized twitching  Chronic tremor is which seemed to be worsened on presentation and also has unexplained generalized twitching  She follows up with her primary care regarding her tremors and had a workup done as outpatient. As per PCP note from November last year  tremor seemed to get worse with anxiety and she's currently on alprazolam. Twitching now resolved.  she does not have a PCP now as her PCP just retired. and is in the process of getting one soon . She also needs a Neurology evaluation as outpatient.  Time spent on Discharge:  25 minutes  Signed: Eddie North 11/09/2011, 5:00 PM

## 2011-11-09 NOTE — Progress Notes (Addendum)
INITIAL ADULT NUTRITION ASSESSMENT Date: 11/09/2011   Time: 12:29 PM  Reason for Assessment: Consult  ASSESSMENT: Female 76 y.o.  Dx: Nausea and vomiting in adult  Hx:  Past Medical History  Diagnosis Date  . CAD (coronary artery disease)   . Hyperlipidemia   . TIA (transient ischemic attack)   . Hypertension   . COPD (chronic obstructive pulmonary disease)   . Depression   . Anemia   . Arthritis   . Lumbago   . Fecal occult blood test positive   . Rectal bleeding   . Internal hemorrhoids   . Esophageal stricture   . Hiatal hernia   . GERD (gastroesophageal reflux disease)   . Anxiety   . Spondylosis   . Mitral regurgitation   . PONV (postoperative nausea and vomiting)   . Chest pain     "@ rest, lying down, w/exertion"  . Dyspnea   . SOB (shortness of breath)     "@ rest, lying down, w/exertion"  . Blood transfusion     Related Meds:     . sodium chloride   Intravenous Once  . sodium chloride   Intravenous STAT  . aspirin EC  81 mg Oral Daily  . celecoxib  200 mg Oral BID  . furosemide  40 mg Oral BID  . hydrochlorothiazide  12.5 mg Oral Daily  . levofloxacin  500 mg Oral Daily  . mulitivitamin with minerals  1 tablet Oral Daily  . olmesartan  20 mg Oral Daily  . pantoprazole  40 mg Oral Daily  . potassium chloride  10 mEq Intravenous Q1 Hr x 4  . potassium chloride  40 mEq Oral BID  . sertraline  25 mg Oral QHS  . DISCONTD: citalopram  40 mg Oral Daily  . DISCONTD: valsartan-hydrochlorothiazide  1 tablet Oral Daily    Ht: 5\' 7"  (170.2 cm)  Wt: 180 lb (81.647 kg)  Ideal Wt: 61.3 kg % Ideal Wt: 132%  Usual Wt: 180 lb -- per office visit October 2012 % Usual Wt: 100%  Body mass index is 28.19 kg/(m^2).  Food/Nutrition Related Hx: dysphagia per nutrition screen  Labs:  CMP     Component Value Date/Time   NA 143 11/09/2011 0650   K 2.8* 11/09/2011 0650   CL 102 11/09/2011 0650   CO2 30 11/09/2011 0650   GLUCOSE 99 11/09/2011 0650   BUN 16 11/09/2011  0650   CREATININE 0.86 11/09/2011 0650   CALCIUM 9.3 11/09/2011 0650   PROT 7.6 04/17/2011 1030   ALBUMIN 4.6 04/17/2011 1030   AST 20 04/17/2011 1030   ALT 17 04/17/2011 1030   ALKPHOS 71 04/17/2011 1030   BILITOT 0.9 04/17/2011 1030   GFRNONAA 65* 11/09/2011 0650   GFRAA 76* 11/09/2011 0650    I/O last 3 completed shifts: In: 1228.3 [I.V.:1228.3] Out: 250 [Urine:250]    Diet Order: Clear Liquid  Supplements/Tube Feeding: N/A  IVF:    sodium chloride Last Rate: 100 mL/hr at 11/09/11 0630    Estimated Nutritional Needs:   Kcal: 2000-2200 Protein: 100-110 gm Fluid: 2.0-2.2 L  RD spoke with pt re: nutrition hx -- states her appetite is getting better; PTA had N/V & abdominal pain -- likely viral etiology per char review; reports dysphagia due to hx of esophageal stricture; pt would benefit from addition of supplement -- amenable to [employer] -- RD to order.  NUTRITION DIAGNOSIS: -Inadequate oral intake (NI-2.1).  Status: Ongoing  RELATED TO: acute gastroenteritis   AS  EVIDENCE BY: clear liquid diet  MONITORING/EVALUATION(Goals): Goal: meet 90-100% of estimated nutrition needs with meals and added supplementation Monitor: PO intake, labs, weight, I/O's  EDUCATION NEEDS: -No education needs identified at this time  INTERVENTION:  Consider swallow evaluation  Add Resource Breeze supplement PO TID (250 kcals, 9 gm protein per 8 fl oz carton)  RD to follow for nutrition care plan  Dietitian #: [phone]  DOCUMENTATION CODES Per approved criteria  -Not Applicable    Alger Memos 11/09/2011, 12:29 PM

## 2011-11-11 LAB — URINE CULTURE: Colony Count: >=100000

## 2011-11-12 ENCOUNTER — Telehealth: Payer: Self-pay | Admitting: Family Medicine

## 2011-11-12 NOTE — Telephone Encounter (Signed)
Pt lft a vm on nurses vm box. Pt was in hosp this wkend with convulsion and shakes. The hosp is wanting pt to come in to see Dr Amador Cunas because doctor is listed as the last physician to see pt at [facility]. Pt was a pt of Dr Scotty Court and is req to est with Dr Amador Cunas. Pls advise.

## 2011-11-12 NOTE — Telephone Encounter (Signed)
Please advise - dr. Scotty Court saw 04/2011 , you saw 08/2011 This would be a post hospital appt

## 2011-11-20 NOTE — Telephone Encounter (Signed)
Please advise 

## 2011-11-20 NOTE — Telephone Encounter (Signed)
Please schedule

## 2011-11-20 NOTE — Telephone Encounter (Signed)
ok 

## 2011-12-06 ENCOUNTER — Other Ambulatory Visit: Payer: Self-pay | Admitting: *Deleted

## 2011-12-06 MED ORDER — SERTRALINE HCL 25 MG PO TABS: 25.0000 mg | ORAL_TABLET | Freq: Every day | ORAL | Status: DC

## 2011-12-24 ENCOUNTER — Other Ambulatory Visit (HOSPITAL_COMMUNITY): Payer: Self-pay | Admitting: Internal Medicine

## 2011-12-31 ENCOUNTER — Encounter: Payer: Self-pay | Admitting: Internal Medicine

## 2011-12-31 ENCOUNTER — Ambulatory Visit (INDEPENDENT_AMBULATORY_CARE_PROVIDER_SITE_OTHER): Payer: BC Managed Care – PPO | Admitting: Internal Medicine

## 2011-12-31 DIAGNOSIS — K219 Gastro-esophageal reflux disease without esophagitis: Secondary | ICD-10-CM

## 2011-12-31 DIAGNOSIS — I10 Essential (primary) hypertension: Secondary | ICD-10-CM

## 2011-12-31 DIAGNOSIS — R251 Tremor, unspecified: Secondary | ICD-10-CM

## 2011-12-31 DIAGNOSIS — F411 Generalized anxiety disorder: Secondary | ICD-10-CM

## 2011-12-31 DIAGNOSIS — E039 Hypothyroidism, unspecified: Secondary | ICD-10-CM

## 2011-12-31 DIAGNOSIS — E876 Hypokalemia: Secondary | ICD-10-CM

## 2011-12-31 MED ORDER — ALPRAZOLAM 1 MG PO TABS: 1.0000 mg | ORAL_TABLET | Freq: Three times a day (TID) | ORAL | Status: DC | PRN

## 2011-12-31 NOTE — Progress Notes (Signed)
Subjective:    Patient ID: [name], female    DOB: 1936-07-16, 76 y.o.   MRN: [id number]  Hypertension Pertinent negatives include no chest pain, headaches, palpitations or shortness of breath.  Urinary Tract Infection  Pertinent negatives include no flank pain, frequency, hematuria, nausea, urgency or vomiting.    76 year old patient who is seen today to establish with my practice. She is a former patient of Dr. Scotty Court. Medical problems include hypothyroidism dyslipidemia hypertension and chronic anxiety. She has coronary artery disease a history of gastroesophageal reflux disease and stricture formation she has osteoarthritis She is evaluated in the ED approximately one month ago for a UTI she was treated for dehydration and hypokalemia. Today she physically is feeling well but under considerable situational stress. Her husband has cognitive dysfunction and paranoid ideation. He is hospitalized for treatment of his psychiatric condition but still has been quite confused and disruptive. The patient has been forced to seek legal assistant stated to be obstructive the habits of her husband. She has a history of a tremor aggravated by anxiety. More recently this has done much better. ED records were reviewed  Social history- married; one son died in a auto accident. Another son deceased due to a suicide that  Past Medical History  Diagnosis Date  . CAD (coronary artery disease)   . Hyperlipidemia   . TIA (transient ischemic attack)   . Hypertension   . COPD (chronic obstructive pulmonary disease)   . Depression   . Anemia   . Arthritis   . Lumbago   . Fecal occult blood test positive   . Rectal bleeding   . Internal hemorrhoids   . Esophageal stricture   . Hiatal hernia   . GERD (gastroesophageal reflux disease)   . Anxiety   . Spondylosis   . Mitral regurgitation   . PONV (postoperative nausea and vomiting)   . Chest pain     "@ rest, lying down, w/exertion"  . Dyspnea    . SOB (shortness of breath)     "@ rest, lying down, w/exertion"  . Blood transfusion     History   Social History  . Marital Status: Married    Spouse Name: N/A    Number of Children: N/A  . Years of Education: N/A   Occupational History  . Not on file.   Social History Main Topics  . Smoking status: Never Smoker   . Smokeless tobacco: Never Used  . Alcohol Use: No  . Drug Use: No  . Sexually Active: Not Currently   Other Topics Concern  . Not on file   Social History Narrative  . No narrative on file    Past Surgical History  Procedure Date  . Total knee arthroplasty ~ 1996    right  . Back surgery 1990; 1994; 1997;2000    "I've got 2 stainless steel rods; 6 screws; 2 ray cages"  . Tonsillectomy and adenoidectomy 1945  . Abdominal hysterectomy 1982  . Appendectomy     pt is unsure if she's had appy  . Tubal ligation ~ 1976  . Dilation and curettage of uterus 1961  . Cataract extraction w/ intraocular lens  implant, bilateral 2010  . Bladder suspension 1980's    Family History  Problem Relation Age of Onset  . Kidney disease Mother   . Heart disease Father     Allergies  Allergen Reactions  . Metaxalone   . Morphine     Current Outpatient Prescriptions on File  Prior to Visit  Medication Sig Dispense Refill  . acetaminophen (TYLENOL) 325 MG tablet Take 650 mg by mouth every 6 (six) hours as needed. For pain      . aspirin 81 MG tablet Take 81 mg by mouth daily.        . celecoxib (CELEBREX) 200 MG capsule Take 1 capsule (200 mg total) by mouth 2 (two) times daily.  60 capsule  11  . citalopram (CELEXA) 40 MG tablet Take 1 tablet (40 mg total) by mouth daily.  30 tablet  11  . esomeprazole (NEXIUM) 40 MG capsule 1 cap bid for severe gerd  60 capsule  11  . furosemide (LASIX) 40 MG tablet TAKE 1 TABLET BY MOUTH TWICE A DAY  60 tablet  0  . Multiple Vitamin (MULTIVITAMIN) tablet Take 1 tablet by mouth daily.        . sertraline (ZOLOFT) 25 MG tablet  Take 1 tablet (25 mg total) by mouth daily.  60 tablet  0  . valsartan-hydrochlorothiazide (DIOVAN-HCT) 160-12.5 MG per tablet Take 1 tablet by mouth daily.  30 tablet  11    BP 124/70  Pulse 70  Temp(Src) 97.6 F (36.4 C) (Oral)  Resp 16  Ht 5\' 4"  (1.626 m)  Wt 169 lb (76.658 kg)  BMI 29.01 kg/m2  SpO2 97%       Review of Systems  Constitutional: Negative.   HENT: Negative for hearing loss, congestion, sore throat, rhinorrhea, dental problem, sinus pressure and tinnitus.   Eyes: Negative for pain, discharge and visual disturbance.  Respiratory: Negative for cough and shortness of breath.   Cardiovascular: Negative for chest pain, palpitations and leg swelling.  Gastrointestinal: Negative for nausea, vomiting, abdominal pain, diarrhea, constipation, blood in stool and abdominal distention.  Genitourinary: Negative for dysuria, urgency, frequency, hematuria, flank pain, vaginal bleeding, vaginal discharge, difficulty urinating, vaginal pain and pelvic pain.  Musculoskeletal: Negative for joint swelling, arthralgias and gait problem.  Skin: Negative for rash.  Neurological: Negative for dizziness, syncope, speech difficulty, weakness, numbness and headaches.  Hematological: Negative for adenopathy.  Psychiatric/Behavioral: Negative for behavioral problems, dysphoric mood and agitation. The patient is nervous/anxious.        Objective:   Physical Exam  Constitutional: She is oriented to person, place, and time. She appears well-developed and well-nourished.  HENT:  Head: Normocephalic.  Right Ear: External ear normal.  Left Ear: External ear normal.  Mouth/Throat: Oropharynx is clear and moist.  Eyes: Conjunctivae and EOM are normal. Pupils are equal, round, and reactive to light.  Neck: Normal range of motion. Neck supple. No thyromegaly present.  Cardiovascular: Normal rate, regular rhythm, normal heart sounds and intact distal pulses.        Diminished left dorsalis  pedis pulse  Pulmonary/Chest: Effort normal and breath sounds normal.  Abdominal: Soft. Bowel sounds are normal. She exhibits no mass. There is no tenderness.  Musculoskeletal: Normal range of motion.  Lymphadenopathy:    She has no cervical adenopathy.  Neurological: She is alert and oriented to person, place, and time.  Skin: Skin is warm and dry. No rash noted.  Psychiatric: She has a normal mood and affect. Her behavior is normal.          Assessment & Plan:

## 2011-12-31 NOTE — Patient Instructions (Signed)
Limit your sodium (Salt) intake    It is important that you exercise regularly, at least 20 minutes 3 to 4 times per week.  If you develop chest pain or shortness of breath seek  medical attention.  Return in 3 months for follow-up  

## 2012-02-04 ENCOUNTER — Emergency Department (HOSPITAL_COMMUNITY)
Admission: EM | Admit: 2012-02-04 | Discharge: 2012-02-05 | Disposition: A | Discharge status: Other Acute Inpatient Hospital | Payer: Medicare Other | Attending: Emergency Medicine | Admitting: Emergency Medicine

## 2012-02-04 ENCOUNTER — Encounter (HOSPITAL_COMMUNITY): Payer: Self-pay | Admitting: Emergency Medicine

## 2012-02-04 DIAGNOSIS — R45851 Suicidal ideations: Secondary | ICD-10-CM

## 2012-02-04 DIAGNOSIS — F32A Depression, unspecified: Secondary | ICD-10-CM

## 2012-02-04 DIAGNOSIS — R079 Chest pain, unspecified: Secondary | ICD-10-CM | POA: Insufficient documentation

## 2012-02-04 DIAGNOSIS — Z8679 Personal history of other diseases of the circulatory system: Secondary | ICD-10-CM | POA: Insufficient documentation

## 2012-02-04 DIAGNOSIS — K219 Gastro-esophageal reflux disease without esophagitis: Secondary | ICD-10-CM | POA: Insufficient documentation

## 2012-02-04 DIAGNOSIS — J449 Chronic obstructive pulmonary disease, unspecified: Secondary | ICD-10-CM | POA: Insufficient documentation

## 2012-02-04 DIAGNOSIS — F329 Major depressive disorder, single episode, unspecified: Secondary | ICD-10-CM | POA: Insufficient documentation

## 2012-02-04 DIAGNOSIS — I2510 Atherosclerotic heart disease of native coronary artery without angina pectoris: Secondary | ICD-10-CM | POA: Insufficient documentation

## 2012-02-04 DIAGNOSIS — J4489 Other specified chronic obstructive pulmonary disease: Secondary | ICD-10-CM | POA: Insufficient documentation

## 2012-02-04 DIAGNOSIS — E7800 Pure hypercholesterolemia, unspecified: Secondary | ICD-10-CM | POA: Insufficient documentation

## 2012-02-04 DIAGNOSIS — F3289 Other specified depressive episodes: Secondary | ICD-10-CM | POA: Insufficient documentation

## 2012-02-04 DIAGNOSIS — Z79899 Other long term (current) drug therapy: Secondary | ICD-10-CM | POA: Insufficient documentation

## 2012-02-04 NOTE — ED Notes (Signed)
JYN:[id number]<AO> Expected date:<BR> Expected time:11:04 PM<BR> Means of arrival:Ambulance<BR> Comments:<BR> M211 -- Suicidal
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
## 2012-02-05 ENCOUNTER — Inpatient Hospital Stay (HOSPITAL_COMMUNITY)
Admission: AD | Admit: 2012-02-05 | Discharge: 2012-02-07 | DRG: 885 | Disposition: A | Discharge status: Home / Self Care | Payer: No Typology Code available for payment source | Source: Ambulatory Visit | Attending: Psychiatry | Admitting: Psychiatry

## 2012-02-05 DIAGNOSIS — K219 Gastro-esophageal reflux disease without esophagitis: Secondary | ICD-10-CM

## 2012-02-05 DIAGNOSIS — J4489 Other specified chronic obstructive pulmonary disease: Secondary | ICD-10-CM

## 2012-02-05 DIAGNOSIS — Z79899 Other long term (current) drug therapy: Secondary | ICD-10-CM

## 2012-02-05 DIAGNOSIS — F339 Major depressive disorder, recurrent, unspecified: Secondary | ICD-10-CM | POA: Diagnosis present

## 2012-02-05 DIAGNOSIS — F332 Major depressive disorder, recurrent severe without psychotic features: Principal | ICD-10-CM

## 2012-02-05 DIAGNOSIS — I10 Essential (primary) hypertension: Secondary | ICD-10-CM

## 2012-02-05 DIAGNOSIS — IMO0002 Reserved for concepts with insufficient information to code with codable children: Secondary | ICD-10-CM

## 2012-02-05 DIAGNOSIS — M479 Spondylosis, unspecified: Secondary | ICD-10-CM

## 2012-02-05 DIAGNOSIS — I059 Rheumatic mitral valve disease, unspecified: Secondary | ICD-10-CM

## 2012-02-05 DIAGNOSIS — F411 Generalized anxiety disorder: Secondary | ICD-10-CM

## 2012-02-05 DIAGNOSIS — Z888 Allergy status to other drugs, medicaments and biological substances status: Secondary | ICD-10-CM

## 2012-02-05 DIAGNOSIS — Z8673 Personal history of transient ischemic attack (TIA), and cerebral infarction without residual deficits: Secondary | ICD-10-CM

## 2012-02-05 DIAGNOSIS — I2510 Atherosclerotic heart disease of native coronary artery without angina pectoris: Secondary | ICD-10-CM

## 2012-02-05 DIAGNOSIS — K449 Diaphragmatic hernia without obstruction or gangrene: Secondary | ICD-10-CM

## 2012-02-05 DIAGNOSIS — Z7982 Long term (current) use of aspirin: Secondary | ICD-10-CM

## 2012-02-05 DIAGNOSIS — J449 Chronic obstructive pulmonary disease, unspecified: Secondary | ICD-10-CM

## 2012-02-05 DIAGNOSIS — E785 Hyperlipidemia, unspecified: Secondary | ICD-10-CM

## 2012-02-05 LAB — RAPID URINE DRUG SCREEN, HOSP PERFORMED
Amphetamines: NOT DETECTED
Benzodiazepines: POSITIVE — AB
Opiates: NOT DETECTED

## 2012-02-05 LAB — CBC
MCHC: 33.6 g/dL (ref 30.0–36.0)
Platelets: 263 10*3/uL (ref 150–400)
RDW: 13 % (ref 11.5–15.5)
WBC: 10 10*3/uL (ref 4.0–10.5)

## 2012-02-05 LAB — BASIC METABOLIC PANEL
Chloride: 96 mEq/L (ref 96–112)
Creatinine, Ser: 1.01 mg/dL (ref 0.50–1.10)
GFR calc Af Amer: 62 mL/min — ABNORMAL LOW (ref >90)
GFR calc non Af Amer: 53 mL/min — ABNORMAL LOW (ref >90)
Potassium: 2.5 mEq/L — CL (ref 3.5–5.1)

## 2012-02-05 LAB — URINALYSIS, ROUTINE W REFLEX MICROSCOPIC
Glucose, UA: NEGATIVE mg/dL
Ketones, ur: NEGATIVE mg/dL
Nitrite: NEGATIVE
Specific Gravity, Urine: 1.013 (ref 1.005–1.030)
pH: 5.5 (ref 5.0–8.0)

## 2012-02-05 LAB — URINE MICROSCOPIC-ADD ON

## 2012-02-05 MED ORDER — ADULT MULTIVITAMIN W/MINERALS CH
1.0000 | ORAL_TABLET | Freq: Every day | ORAL | Status: DC
  Administered 2012-02-05 – 2012-02-07 (×3): 1 via ORAL
  Filled 2012-02-05 (×5): qty 1

## 2012-02-05 MED ORDER — ONE-DAILY MULTI VITAMINS PO TABS: 1.0000 | ORAL_TABLET | Freq: Every day | ORAL | Status: DC

## 2012-02-05 MED ORDER — VALSARTAN-HYDROCHLOROTHIAZIDE 160-12.5 MG PO TABS: 1.0000 | ORAL_TABLET | Freq: Every day | ORAL | Status: DC

## 2012-02-05 MED ORDER — CELECOXIB 200 MG PO CAPS
200.0000 mg | ORAL_CAPSULE | Freq: Two times a day (BID) | ORAL | Status: DC
  Administered 2012-02-05: 200 mg via ORAL
  Filled 2012-02-05 (×5): qty 1

## 2012-02-05 MED ORDER — SERTRALINE HCL 50 MG PO TABS
25.0000 mg | ORAL_TABLET | Freq: Every day | ORAL | Status: DC
  Administered 2012-02-05: 25 mg via ORAL
  Filled 2012-02-05: qty 1

## 2012-02-05 MED ORDER — CLONAZEPAM 0.5 MG PO TABS
0.5000 mg | ORAL_TABLET | Freq: Three times a day (TID) | ORAL | Status: DC | PRN
  Administered 2012-02-06 – 2012-02-07 (×5): 0.5 mg via ORAL
  Filled 2012-02-05 (×5): qty 1

## 2012-02-05 MED ORDER — ALPRAZOLAM 0.5 MG PO TABS
1.0000 mg | ORAL_TABLET | Freq: Three times a day (TID) | ORAL | Status: DC | PRN
  Administered 2012-02-05: 1 mg via ORAL
  Filled 2012-02-05 (×2): qty 1

## 2012-02-05 MED ORDER — CITALOPRAM HYDROBROMIDE 20 MG PO TABS
40.0000 mg | ORAL_TABLET | Freq: Every day | ORAL | Status: DC
  Administered 2012-02-05: 40 mg via ORAL
  Filled 2012-02-05 (×3): qty 2

## 2012-02-05 MED ORDER — PANTOPRAZOLE SODIUM 40 MG PO TBEC
40.0000 mg | DELAYED_RELEASE_TABLET | Freq: Every day | ORAL | Status: DC
  Administered 2012-02-05: 40 mg via ORAL
  Filled 2012-02-05: qty 1

## 2012-02-05 MED ORDER — HYDROCHLOROTHIAZIDE 12.5 MG PO CAPS
12.5000 mg | ORAL_CAPSULE | Freq: Every day | ORAL | Status: DC
  Administered 2012-02-06 – 2012-02-07 (×2): 12.5 mg via ORAL
  Filled 2012-02-05 (×4): qty 1

## 2012-02-05 MED ORDER — ASPIRIN 81 MG PO CHEW
81.0000 mg | CHEWABLE_TABLET | Freq: Every day | ORAL | Status: DC
  Administered 2012-02-05: 81 mg via ORAL
  Filled 2012-02-05: qty 1

## 2012-02-05 MED ORDER — POTASSIUM CHLORIDE CRYS ER 20 MEQ PO TBCR
40.0000 meq | EXTENDED_RELEASE_TABLET | Freq: Once | ORAL | Status: AC
  Administered 2012-02-05: 40 meq via ORAL
  Filled 2012-02-05: qty 2

## 2012-02-05 MED ORDER — ACETAMINOPHEN 325 MG PO TABS
650.0000 mg | ORAL_TABLET | Freq: Four times a day (QID) | ORAL | Status: DC | PRN
  Administered 2012-02-05: 650 mg via ORAL

## 2012-02-05 MED ORDER — IRBESARTAN 150 MG PO TABS
150.0000 mg | ORAL_TABLET | Freq: Every day | ORAL | Status: DC
  Administered 2012-02-06 – 2012-02-07 (×2): 150 mg via ORAL
  Filled 2012-02-05 (×4): qty 1

## 2012-02-05 MED ORDER — MAGNESIUM HYDROXIDE 400 MG/5ML PO SUSP: 30.0000 mL | Freq: Every day | ORAL | Status: DC | PRN

## 2012-02-05 MED ORDER — PANTOPRAZOLE SODIUM 40 MG PO TBEC
40.0000 mg | DELAYED_RELEASE_TABLET | Freq: Every day | ORAL | Status: DC
  Administered 2012-02-05 – 2012-02-06 (×2): 40 mg via ORAL
  Filled 2012-02-05 (×5): qty 1

## 2012-02-05 MED ORDER — VALSARTAN 160 MG PO TABS
160.0000 mg | ORAL_TABLET | Freq: Every day | ORAL | Status: DC
  Administered 2012-02-05: 160 mg via ORAL
  Filled 2012-02-05 (×3): qty 1

## 2012-02-05 MED ORDER — ALUM & MAG HYDROXIDE-SIMETH 200-200-20 MG/5ML PO SUSP: 30.0000 mL | ORAL | Status: DC | PRN

## 2012-02-05 MED ORDER — ASPIRIN 81 MG PO TABS: 81.0000 mg | ORAL_TABLET | Freq: Every day | ORAL | Status: DC

## 2012-02-05 MED ORDER — FUROSEMIDE 40 MG PO TABS
40.0000 mg | ORAL_TABLET | Freq: Two times a day (BID) | ORAL | Status: DC
  Administered 2012-02-05: 40 mg via ORAL
  Filled 2012-02-05 (×5): qty 1

## 2012-02-05 MED ORDER — HYDROCHLOROTHIAZIDE 12.5 MG PO CAPS
12.5000 mg | ORAL_CAPSULE | Freq: Every day | ORAL | Status: DC
  Administered 2012-02-05: 12.5 mg via ORAL
  Filled 2012-02-05 (×4): qty 1

## 2012-02-05 MED ORDER — ACETAMINOPHEN 325 MG PO TABS
650.0000 mg | ORAL_TABLET | Freq: Four times a day (QID) | ORAL | Status: DC | PRN
  Administered 2012-02-05: 650 mg via ORAL
  Filled 2012-02-05: qty 2

## 2012-02-05 MED ORDER — FUROSEMIDE 40 MG PO TABS
40.0000 mg | ORAL_TABLET | Freq: Every day | ORAL | Status: DC
  Administered 2012-02-06 – 2012-02-07 (×2): 40 mg via ORAL
  Filled 2012-02-05 (×5): qty 1

## 2012-02-05 MED ORDER — SERTRALINE HCL 50 MG PO TABS
50.0000 mg | ORAL_TABLET | Freq: Every day | ORAL | Status: DC
  Administered 2012-02-05 – 2012-02-07 (×3): 50 mg via ORAL
  Filled 2012-02-05 (×5): qty 1

## 2012-02-05 MED ORDER — CELECOXIB 200 MG PO CAPS
200.0000 mg | ORAL_CAPSULE | ORAL | Status: DC
  Administered 2012-02-05 – 2012-02-07 (×4): 200 mg via ORAL
  Filled 2012-02-05: qty 1
  Filled 2012-02-05: qty 2
  Filled 2012-02-05 (×7): qty 1

## 2012-02-05 MED ORDER — ASPIRIN EC 81 MG PO TBEC
81.0000 mg | DELAYED_RELEASE_TABLET | Freq: Every day | ORAL | Status: DC
  Administered 2012-02-06 – 2012-02-07 (×2): 81 mg via ORAL
  Filled 2012-02-05 (×4): qty 1

## 2012-02-05 NOTE — ED Notes (Signed)
MD at bedside. 
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
## 2012-02-05 NOTE — ED Notes (Signed)
Informed pt of need of urine specimen  Pt states she will notify me when she needs to use the restroom  Comfort measures provided

## 2012-02-05 NOTE — BH Assessment (Signed)
This assessment/evaluation/note was completed by a Child psychotherapist of Social Work Warden/ranger and as Investment banker, corporate, I was immediately available for consultation/collaboration. I am in agreement with the contents and disposition reflected in the assessment/evaluation/note(s).   Manson Passey Jamicheal Heard ANN S , MSW, LCSWA 02/05/2012 12:02 PM [phone]
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
## 2012-02-05 NOTE — ED Notes (Signed)
Pt accepted to [facility] by Dr. Allena Katz to Dr. Dan Humphreys (500-2). Completed support documentation and updated RN and EDP. Pt is voluntary and to be transported via security.
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
## 2012-02-05 NOTE — ED Provider Notes (Signed)
Patient's care discussed with Clydie Braun, ACT and states that patient is pending acceptance at [facility]  Hilario Quarry, MD 02/05/12 1355

## 2012-02-06 DIAGNOSIS — F411 Generalized anxiety disorder: Secondary | ICD-10-CM

## 2012-02-06 DIAGNOSIS — F339 Major depressive disorder, recurrent, unspecified: Secondary | ICD-10-CM

## 2012-02-06 LAB — T3, FREE: T3, Free: 3.1 pg/mL (ref 2.3–4.2)

## 2012-02-06 LAB — VITAMIN B12: Vitamin B-12: 513 pg/mL (ref 211–911)

## 2012-02-06 LAB — TSH: TSH: 2.35 u[IU]/mL (ref 0.350–4.500)
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
## 2012-02-07 MED ORDER — SERTRALINE HCL 25 MG PO TABS: 25.0000 mg | ORAL_TABLET | Freq: Every day | ORAL | Status: DC

## 2012-02-07 MED ORDER — ASPIRIN 81 MG PO TABS: 81.0000 mg | ORAL_TABLET | Freq: Every day | ORAL | Status: DC

## 2012-02-07 MED ORDER — ESOMEPRAZOLE MAGNESIUM 40 MG PO CPDR: DELAYED_RELEASE_CAPSULE | ORAL | Status: DC

## 2012-02-07 MED ORDER — CLONAZEPAM 0.5 MG PO TABS: 0.5000 mg | ORAL_TABLET | Freq: Three times a day (TID) | ORAL | Status: DC | PRN

## 2012-02-07 MED ORDER — VALSARTAN-HYDROCHLOROTHIAZIDE 160-12.5 MG PO TABS: 1.0000 | ORAL_TABLET | Freq: Every day | ORAL | Status: DC

## 2012-02-07 MED ORDER — FUROSEMIDE 40 MG PO TABS: 40.0000 mg | ORAL_TABLET | Freq: Two times a day (BID) | ORAL | Status: DC

## 2012-02-07 MED ORDER — SERTRALINE HCL 25 MG PO TABS
25.0000 mg | ORAL_TABLET | Freq: Every day | ORAL | Status: DC
  Administered 2012-02-07: 16:00:00 via ORAL
  Filled 2012-02-07 (×3): qty 14

## 2012-02-07 MED ORDER — CELECOXIB 200 MG PO CAPS: 200.0000 mg | ORAL_CAPSULE | Freq: Two times a day (BID) | ORAL | Status: DC

## 2012-02-07 MED ORDER — ONE-DAILY MULTI VITAMINS PO TABS: 1.0000 | ORAL_TABLET | Freq: Every day | ORAL | Status: DC

## 2012-02-07 NOTE — [facility] Suicide Risk Assessment (Signed)
Suicide Risk Assessment  Discharge Assessment     Demographic factors:  Age 76 or older;Caucasian   Current Mental Status Per Nursing Assessment::   On Admission:   (Denies SI/HI) At Discharge:  AO x 3.  No SI/HI.  No auditory or visual hallucinations or delusional thinking.  Current Mental Status Per Physician:  Diagnosis:  Axis I: Major Depressive Disorder - Recurrent.  Generalized Anxiety Disorder.   The patient was seen today and reports the following:   ADL's: Intact.  Sleep: The patient reports to sleeping well without difficulty.  Appetite: The patient reports a good appetite today.   Mild>(1-10) >Severe  Hopelessness (1-10): 0  Depression (1-10): 0  Anxiety (1-10): 0   Suicidal Ideation: The patient adamantly denies any suicidal ideations today.  Plan: No  Intent: No  Means: No  Homicidal Ideation: The patient adamantly denies any homicidal ideations today.  Plan: No  Intent: No.  Means: No   General Appearance/Behavior: The patient remains friendly and cooperative today.  Eye Contact: Good.  Speech: Appropriate in rate and volume with no pressuring of speech noted today.  Motor Behavior: wnl.  Level of Consciousness: Alert and Oriented x 3.  Mental Status: Alert and Oriented x 3.  Mood: Essentially Euthymic.  Affect: Full.  Anxiety Level: No anxiety reported or noted.  Thought Process: wnl.  Thought Content: The patient denied any auditory or visual hallucinations or delusional thinking.  Perception: wnl.  Judgment: Good.  Insight: Good.  Cognition: Oriented to person, place and time.   Loss Factors: Decline in health of spouse.  Recent marital separation.  Past deaths of two sons.  Historical Factors: Family history of suicide;Family history of mental illness or substance abuse  Risk Reduction Factors:   Good family support.  Good access to health care.  Continued Clinical Symptoms:  Depression:   Insomnia Previous Psychiatric Diagnoses and  Treatments Medical Diagnoses and Treatments/Surgeries  Discharge Diagnoses:   AXIS I:   Major Depressive Disorder - Recurrent.    Generalized Anxiety Disorder.  AXIS II:   Deferred. AXIS III:  1.  Coronary Artery Disease.   2.  Hyperlipidemia.   3.  H/O Transient Ischemic Attacks.   4.  COPD.   5.  Amenia.   6.  Osteoarthritis.   7.  Lumbago.   8.  Internal Hemorrhoids.   9.  Esophageal Stricture.   10.Hiatal Hernia.   11.GERD.   12. Spondylosis.   13.Mitral Regurgitation. AXIS IV:   Multiple Mental Issues.  Recent Marital Separation.    AXIS V:  GAF at time of admission approximately 45.  GAF at time of discharge approximately 65.   Cognitive Features That Contribute To Risk:  None Noted.  The patient was seen today and states she feels ready for discharge.  She states she is sleeping reasonably well but is having some difficulty with the interruptions at night on the unit.  She reports no depression today and denies any SI/HI.  The patient denies any auditory or visual hallucinations or delusional thinking.  She states that she plans to live with her brother until the situation with her husband is resolved.  Overall she reports that she is thankful for the help she has received at [facility] and plans to keep her follow up appointments.  Treatment Plan Summary:  1. Daily contact with patient to assess and evaluate symptoms and progress in treatment  2. Medication management  3. The patient will deny suicidal ideations or homicidal ideations for  48 hours prior to discharge and have a depression and anxiety rating of 3 or less. The patient will also deny any auditory or visual hallucinations or delusional thinking.  4. The patient will deny any symptoms of substance withdrawal at time of discharge.   Plan:  1. Will continue the patient on her current medications. 2. Will continue to monitor.  3. Laboratory studies reviewed.  4. Will discharge the patient today to outpatient follow up  per the patient's request.  Suicide Risk:  Minimal: No identifiable suicidal ideation.  Patients presenting with no risk factors but with morbid ruminations; may be classified as minimal risk based on the severity of the depressive symptoms  Plan Of Care/Follow-up recommendations:  Activity:  As tolerated. Diet:  Heart Health Diet. Other:  Please take all medications only as directed and keep all scheduled follow up appointments.  Jaeleah Smyser 02/07/2012, 2:30 PM

## 2012-02-07 NOTE — Progress Notes (Addendum)
[facility] Group Notes: (Counselor/Nursing/MHT/Case Management/Adjunct) 02/07/2012   @1 :15pm [facility]  Type of Therapy:  Group Therapy  Participation Level:  Active  Participation Quality:  Attentive, Sharing, Appropriate   Affect:  Appropriate  Cognitive:  Appropriate  Insight:  Good  Engagement in Group: Good  Engagement in Therapy:  Good  Modes of Intervention:  Support and Exploration  Summary of Progress/Problems: Deloresl was very engage in discussion of programs and services provided by [facility]. He expressed interest in the services.  [name] 02/07/2012 3:00 PM
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
## 2012-02-07 NOTE — Progress Notes (Addendum)
[facility] Adult Inpatient Family/Significant Other Suicide Prevention Education  Suicide Prevention Education:  Contact Attempts: Sedalia Muta, sister-in-law [phone]) has been identified by the patient as the family member/significant other with whom the patient will be residing, and identified as the person(s) who will aid the patient in the event of a mental health crisis.  With written consent from the patient, two attempts were made to provide suicide prevention education, prior to and/or following the patient's discharge.  We were unsuccessful in providing suicide prevention education.  A suicide education pamphlet was given to the patient to share with family/significant other.  Date and time of first attempt:02/07/12  @ 12:26pm Date and time of second attempt:02/07/12 @ 2:58pm   Counselor provided Julie Rice with suicide prevention pamphlet and reviewed the same with her. David verbalized understanding risk factors, warning signs, and crisis contact numbers for suicide. She had no further questions and agreed to give pamphlet to either her brother or sister-in-law.  [name] 02/07/2012, 2:58 PM

## 2012-02-07 NOTE — Tx Team (Signed)
Interdisciplinary Treatment Plan Update (Adult)  Date:  02/07/2012  Time Reviewed:  10:41 AM   Progress in Treatment: Attending groups: Yes Participating in groups:  Yes Taking medication as prescribed: Yes Tolerating medication:  Yes Family/Significant other contact made:  Counselor assessing for appropriate contact Patient understands diagnosis:  Yes Discussing patient identified problems/goals with staff:  Yes Medical problems stabilized or resolved:  Yes Denies suicidal/homicidal ideation: Yes Issues/concerns per patient self-inventory:  None identified Other: N/A  New problem(s) identified: None Identified  Reason for Continuation of Hospitalization: Anxiety Depression Medication stabilization  Interventions implemented related to continuation of hospitalization: mood stabilization, medication monitoring and adjustment, group therapy and psycho education, safety checks q 15 mins  Additional comments: N/A  Estimated length of stay: 3-5 days  Discharge Plan: SW will seek appropriate follow up for pt for medication management and therapy  New goal(s): N/A  Review of initial/current patient goals per problem list:    1.  Goal(s): Reduce depressive symptoms  Met:  No  Target date: by discharge  As evidenced by: Reducing depression from a 10 to a 3 as reported by pt.   2.  Goal (s): Reduce/Eliminate suicidal ideation  Met:  No  Target date: by discharge  As evidenced by: pt reporting no SI.    3.  Goal(s): Reduce anxiety symptoms  Met:  No  Target date: by discharge  As evidenced by: Reduce anxiety from a 10 to a 3 as reported by pt.    Attendees: Patient:     Family:     Physician:  Orson Aloe, MD  02/07/2012  10:41 AM   Nursing:   Manuela Schwartz, RN 02/07/2012 10:41 AM   Case Manager:  Reyes Ivan, LCSWA 02/07/2012  10:41 AM   Counselor:  Angus Palms, LCSW 02/07/2012  10:41 AM   Other:  Juline Patch, LCSW 02/07/2012  10:41 AM   Other:   02/07/2012  10:41 AM   Other:     Other:      Scribe for Treatment Team:   Carmina Miller, 02/07/2012 , 10:41 AM

## 2012-02-07 NOTE — Progress Notes (Signed)
02/07/2012 Nursing  D Pt is given DC instructions and states she understands. She denies audit, tactile halluc and states she will take her meds as ordered. She signed consent for release of med record, she was given med samples, SRA completed and pt DC'd per protocol;. PD RN [facility]

## 2012-02-07 NOTE — Progress Notes (Signed)
Pt attended discharge planning group and actively participated.  Pt presents with calm mood and affect.  Pt ranks depression and anxiety at a 1 today and denies SI.  Pt states she didn't sleep well last night but believes it's from taking a nap yesterday.  Pt states that she wants to d/c today.  Pt states her brother will pick her up when time to d/c.  Pt open to referral for psychiatrist and therapist.  SW discussed [facility] for follow up; pt open to this referral.  SW will make referral to [facility].  No further needs voiced by pt at this time.  Safety planning and suicide prevention discussed.     Reyes Ivan, LCSWA 02/07/2012  9:46 AM

## 2012-02-07 NOTE — Progress Notes (Signed)
[facility] Adult Inpatient Family/Significant Other Suicide Prevention Education  Suicide Prevention Education:  Education Completed;Diane, sister-in-law [phone]) has been identified by the patient as the family member/significant other with whom the patient will be residing, and identified as the person(s) who will aid the patient in the event of a mental health crisis (suicidal ideations/suicide attempt).  With written consent from the patient, the family member/significant other has been provided the following suicide prevention education, prior to the and/or following the discharge of the patient.  The suicide prevention education provided includes the following:  Suicide risk factors  Suicide prevention and interventions  National Suicide Hotline telephone number  [facility] assessment telephone number  [facility] Emergency Assistance 911  [facility] and/or Residential Mobile Crisis Unit telephone number  Request made of family/significant other to:  Remove weapons (e.g., guns, rifles, knives), all items previously/currently identified as safety concern.    Remove drugs/medications (over-the-counter, prescriptions, illicit drugs), all items previously/currently identified as a safety concern.  Diane reported that Taya has no access to firearms. She verbalized understanding of suicide prevention and had no further problems.   [name] 02/07/2012, 4:31 PM
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
## 2012-02-07 NOTE — Progress Notes (Addendum)
[facility] Group Notes: (Counselor/Nursing/MHT/Case Management/Adjunct) 02/07/2012   @11 :00am Preventing Relapse  Type of Therapy:  Group Therapy  Participation Level:  Active  Participation Quality:  Attentive, Sharing, Appropriate    Affect:  Appropriate  Cognitive:  Appropriate  Insight:  Good  Engagement in Group: Good  Engagement in Therapy:  Good  Modes of Intervention:  Support and Exploration  Summary of Progress/Problems: Julie Rice explored her thoughts and feelings that might lead to relapse. She identified that being mistreated by others and feeling alone or abandoned might lead her back into depression and suicidality. Julie Rice processed ways to cope with life, whether the happenings are expected or unexpected, rather than relapsing. She identified the uncontrollable even that triggered her as her husband leaving and death of a family member.   [name] 02/07/2012 12:23 PM

## 2012-02-11 NOTE — Progress Notes (Signed)
Patient Discharge Instructions:  Psychiatric Admission Assessment Note Provided,  02/11/2012 After Visit Summary (AVS) Provided,  02/11/2012 Face Sheet Provided, 02/11/2012 Faxed/Sent to the Next Level Care provider:  02/11/2012 Provided Suicide Risk Assessment - Discharge Assessment 02/11/2012  Faxed to [facility] @ [phone]  Wandra Scot, 02/11/2012, 10:51 AM

## 2012-02-21 ENCOUNTER — Other Ambulatory Visit (HOSPITAL_COMMUNITY): Payer: Self-pay | Admitting: Internal Medicine

## 2012-02-25 ENCOUNTER — Encounter: Payer: Self-pay | Admitting: Internal Medicine

## 2012-02-25 ENCOUNTER — Ambulatory Visit (INDEPENDENT_AMBULATORY_CARE_PROVIDER_SITE_OTHER): Payer: BC Managed Care – PPO | Admitting: Internal Medicine

## 2012-02-25 VITALS — BP 110/70 | Temp 97.9°F | Wt 170.0 lb

## 2012-02-25 DIAGNOSIS — E785 Hyperlipidemia, unspecified: Secondary | ICD-10-CM

## 2012-02-25 DIAGNOSIS — I10 Essential (primary) hypertension: Secondary | ICD-10-CM

## 2012-02-25 DIAGNOSIS — F411 Generalized anxiety disorder: Secondary | ICD-10-CM

## 2012-02-25 DIAGNOSIS — I6529 Occlusion and stenosis of unspecified carotid artery: Secondary | ICD-10-CM

## 2012-02-25 MED ORDER — CITALOPRAM HYDROBROMIDE 20 MG PO TABS: 40.0000 mg | ORAL_TABLET | Freq: Every day | ORAL | Status: DC

## 2012-02-25 MED ORDER — CELECOXIB 200 MG PO CAPS: 200.0000 mg | ORAL_CAPSULE | Freq: Two times a day (BID) | ORAL | Status: DC

## 2012-02-25 MED ORDER — ESOMEPRAZOLE MAGNESIUM 40 MG PO CPDR: DELAYED_RELEASE_CAPSULE | ORAL | Status: DC

## 2012-02-25 MED ORDER — FUROSEMIDE 40 MG PO TABS: 40.0000 mg | ORAL_TABLET | Freq: Every day | ORAL | Status: DC

## 2012-02-25 MED ORDER — CLONAZEPAM 0.5 MG PO TABS: 0.5000 mg | ORAL_TABLET | Freq: Three times a day (TID) | ORAL | Status: DC | PRN

## 2012-02-25 MED ORDER — SERTRALINE HCL 25 MG PO TABS: 25.0000 mg | ORAL_TABLET | Freq: Every day | ORAL | Status: DC

## 2012-02-25 MED ORDER — VALSARTAN-HYDROCHLOROTHIAZIDE 160-12.5 MG PO TABS: 1.0000 | ORAL_TABLET | Freq: Every day | ORAL | Status: DC

## 2012-02-25 NOTE — Progress Notes (Signed)
  Subjective:    Patient ID: Julie Rice, female    DOB: [birth year], 76 y.o.   MRN: [id number]  HPI  76 year old patient who is seen today for followup. Since her last visit here she has been hospitalized for major depression. She has been going through major situational stressors including separation from her husband of 57 years. She is receiving counseling and seems to be coping fairly well. She has treated hypertension and dyslipidemia which have been stable. Her medications are reviewed. Alprazolam has been discontinued and clonidine and Celexa substituted. She does require medication refills Hospital records reviewed    Review of Systems  Constitutional: Negative.   HENT: Negative for hearing loss, congestion, sore throat, rhinorrhea, dental problem, sinus pressure and tinnitus.   Eyes: Negative for pain, discharge and visual disturbance.  Respiratory: Negative for cough and shortness of breath.   Cardiovascular: Negative for chest pain, palpitations and leg swelling.  Gastrointestinal: Negative for nausea, vomiting, abdominal pain, diarrhea, constipation, blood in stool and abdominal distention.  Genitourinary: Negative for dysuria, urgency, frequency, hematuria, flank pain, vaginal bleeding, vaginal discharge, difficulty urinating, vaginal pain and pelvic pain.  Musculoskeletal: Negative for joint swelling, arthralgias and gait problem.  Skin: Negative for rash.  Neurological: Negative for dizziness, syncope, speech difficulty, weakness, numbness and headaches.  Hematological: Negative for adenopathy.  Psychiatric/Behavioral: Positive for dysphoric mood. Negative for behavioral problems and agitation. The patient is nervous/anxious.        Objective:   Physical Exam  Constitutional: She is oriented to person, place, and time. She appears well-developed and well-nourished.  HENT:  Head: Normocephalic.  Right Ear: External ear normal.  Left Ear: External ear normal.    Mouth/Throat: Oropharynx is clear and moist.  Eyes: Conjunctivae and EOM are normal. Pupils are equal, round, and reactive to light.  Neck: Normal range of motion. Neck supple. No thyromegaly present.  Cardiovascular: Normal rate, regular rhythm, normal heart sounds and intact distal pulses.   Pulmonary/Chest: Effort normal and breath sounds normal.  Abdominal: Soft. Bowel sounds are normal. She exhibits no mass. There is no tenderness.  Musculoskeletal: Normal range of motion.  Lymphadenopathy:    She has no cervical adenopathy.  Neurological: She is alert and oriented to person, place, and time.  Skin: Skin is warm and dry. No rash noted.  Psychiatric: She has a normal mood and affect. Her behavior is normal.          Assessment & Plan:   Maj. depression. Improved Hypertension well controlled Carotid artery disease asymptomatic dyslipidemia history of hypothyroidism. All medications refilled recheck in 3 months

## 2012-02-25 NOTE — Patient Instructions (Signed)
Limit your sodium (Salt) intake    It is important that you exercise regularly, at least 20 minutes 3 to 4 times per week.  If you develop chest pain or shortness of breath seek  medical attention.  Return in 3 months for follow-up  

## 2012-03-19 ENCOUNTER — Ambulatory Visit (INDEPENDENT_AMBULATORY_CARE_PROVIDER_SITE_OTHER): Payer: BC Managed Care – PPO | Admitting: Internal Medicine

## 2012-03-19 ENCOUNTER — Encounter: Payer: Self-pay | Admitting: Internal Medicine

## 2012-03-19 VITALS — BP 120/84 | Temp 98.0°F | Wt 171.0 lb

## 2012-03-19 DIAGNOSIS — M1990 Unspecified osteoarthritis, unspecified site: Secondary | ICD-10-CM

## 2012-03-19 DIAGNOSIS — F411 Generalized anxiety disorder: Secondary | ICD-10-CM

## 2012-03-19 DIAGNOSIS — I10 Essential (primary) hypertension: Secondary | ICD-10-CM

## 2012-03-19 MED ORDER — CLONAZEPAM 0.5 MG PO TABS: 0.5000 mg | ORAL_TABLET | Freq: Three times a day (TID) | ORAL | Status: DC | PRN

## 2012-03-19 MED ORDER — MELOXICAM 15 MG PO TABS: 15.0000 mg | ORAL_TABLET | Freq: Every day | ORAL | Status: DC

## 2012-03-19 NOTE — Progress Notes (Signed)
  Subjective:    Patient ID: Julie Rice, female    DOB: [birth year], 76 y.o.   MRN: [id number]  HPI  76 year old patient who presents with concerns about possible cervical adenopathy. Her husband apparently has been diagnosed with lymphoma in the past and presented with cervical adenopathy. She also complains of significant cervical pain with head turning. She does have a history of chronic back pain and osteoarthritis. She is going through a separation from her husband and there has been considerable situational depression and anxiety    Review of Systems  Musculoskeletal: Positive for back pain and arthralgias.  Psychiatric/Behavioral: Positive for dysphoric mood. The patient is nervous/anxious.        Objective:   Physical Exam  Constitutional: She appears well-developed and well-nourished. No distress.  Neck:       Decreased range of motion due to stiffness and discomfort  No cervical adenopathy appreciated          Assessment & Plan:   Cervical DJD. Will give a trial of Mobic. Patient feels the Celebrex has not been effective No evidence of cervical adenopathy. We'll clinically observe at this time Hypertension stable Situational anxiety depression. The patient is interested in behavioral health referral.

## 2012-03-19 NOTE — Patient Instructions (Signed)
Call or return to clinic prn if these symptoms worsen or fail to improve as anticipated.

## 2012-05-01 ENCOUNTER — Other Ambulatory Visit (HOSPITAL_COMMUNITY): Payer: Self-pay | Admitting: Internal Medicine

## 2012-05-01 ENCOUNTER — Other Ambulatory Visit: Payer: Self-pay | Admitting: Family Medicine

## 2012-05-11 ENCOUNTER — Other Ambulatory Visit: Payer: Self-pay

## 2012-05-11 MED ORDER — SERTRALINE HCL 25 MG PO TABS: 25.0000 mg | ORAL_TABLET | Freq: Two times a day (BID) | ORAL | Status: DC

## 2012-05-11 MED ORDER — ESOMEPRAZOLE MAGNESIUM 40 MG PO CPDR: DELAYED_RELEASE_CAPSULE | ORAL | Status: DC

## 2012-05-11 MED ORDER — VALSARTAN-HYDROCHLOROTHIAZIDE 160-12.5 MG PO TABS: 1.0000 | ORAL_TABLET | Freq: Every day | ORAL | Status: DC

## 2012-05-11 MED ORDER — MELOXICAM 15 MG PO TABS: 15.0000 mg | ORAL_TABLET | Freq: Every day | ORAL | Status: DC

## 2012-05-11 NOTE — Telephone Encounter (Signed)
Refills done - these had been ordered before -new rx's sent - faxed

## 2012-05-18 ENCOUNTER — Other Ambulatory Visit: Payer: Self-pay | Admitting: Internal Medicine

## 2012-06-05 ENCOUNTER — Encounter: Payer: Self-pay | Admitting: Internal Medicine

## 2012-06-05 ENCOUNTER — Ambulatory Visit (INDEPENDENT_AMBULATORY_CARE_PROVIDER_SITE_OTHER): Payer: BC Managed Care – PPO | Admitting: Internal Medicine

## 2012-06-05 VITALS — BP 120/70 | Temp 98.4°F | Wt 174.0 lb

## 2012-06-05 DIAGNOSIS — M1990 Unspecified osteoarthritis, unspecified site: Secondary | ICD-10-CM

## 2012-06-05 DIAGNOSIS — R600 Localized edema: Secondary | ICD-10-CM

## 2012-06-05 DIAGNOSIS — E039 Hypothyroidism, unspecified: Secondary | ICD-10-CM

## 2012-06-05 DIAGNOSIS — R609 Edema, unspecified: Secondary | ICD-10-CM

## 2012-06-05 DIAGNOSIS — F339 Major depressive disorder, recurrent, unspecified: Secondary | ICD-10-CM

## 2012-06-05 DIAGNOSIS — F411 Generalized anxiety disorder: Secondary | ICD-10-CM

## 2012-06-05 DIAGNOSIS — I10 Essential (primary) hypertension: Secondary | ICD-10-CM

## 2012-06-05 NOTE — Patient Instructions (Signed)
Limit your sodium (Salt) intake    It is important that you exercise regularly, at least 20 minutes 3 to 4 times per week.  If you develop chest pain or shortness of breath seek  medical attention.  You need to lose weight.  Consider a lower calorie diet and regular exercise.  Return in 3 months for follow-up  Please check your blood pressure on a regular basis.  If it is consistently greater than 150/90, please make an office appointment.

## 2012-06-05 NOTE — Progress Notes (Signed)
Subjective:    Patient ID: Julie Rice, female    DOB: [birth year], 76 y.o.   MRN: [id number]  HPI  76 year old patient who is seen today for followup. She has a history of chronic anxiety hypertension osteoarthritis complaints today include right knee pain mild swelling involving the right ankle as well as right great toe discomfort. She also describes some numbness involving the right great toe. She has treated hypertension which has been stable. She continues to be concerned about possible cervical adenopathy. Her husband had Hodgkin's lymphoma and presented with cervical adenopathy  Past Medical History  Diagnosis Date  . CAD (coronary artery disease)   . Hyperlipidemia   . TIA (transient ischemic attack)   . Hypertension   . COPD (chronic obstructive pulmonary disease)   . Depression   . Anemia   . Arthritis   . Lumbago   . Fecal occult blood test positive   . Rectal bleeding   . Internal hemorrhoids   . Esophageal stricture   . Hiatal hernia   . GERD (gastroesophageal reflux disease)   . Anxiety   . Spondylosis   . Mitral regurgitation   . PONV (postoperative nausea and vomiting)   . Chest pain     "@ rest, lying down, w/exertion"  . Dyspnea   . SOB (shortness of breath)     "@ rest, lying down, w/exertion"  . Blood transfusion     History   Social History  . Marital Status: Married    Spouse Name: N/A    Number of Children: N/A  . Years of Education: N/A   Occupational History  . Not on file.   Social History Main Topics  . Smoking status: Never Smoker   . Smokeless tobacco: Never Used  . Alcohol Use: No  . Drug Use: No  . Sexually Active: Not Currently   Other Topics Concern  . Not on file   Social History Narrative  . No narrative on file    Past Surgical History  Procedure Date  . Total knee arthroplasty ~ 1996    right  . Back surgery 1990; 1994; 1997;2000    "I've got 2 stainless steel rods; 6 screws; 2 ray cages"  . Tonsillectomy and  adenoidectomy 1945  . Abdominal hysterectomy 1982  . Appendectomy     pt is unsure if she's had appy  . Tubal ligation ~ 1976  . Dilation and curettage of uterus 1961  . Cataract extraction w/ intraocular lens  implant, bilateral 2010  . Bladder suspension 1980's    Family History  Problem Relation Age of Onset  . Kidney disease Mother   . Heart disease Father     Allergies  Allergen Reactions  . Metaxalone   . Morphine     Current Outpatient Prescriptions on File Prior to Visit  Medication Sig Dispense Refill  . aspirin 81 MG tablet Take 1 tablet (81 mg total) by mouth daily. For cardiovascular health.      . citalopram (CELEXA) 40 MG tablet Take 40 mg by mouth daily.       Marland Kitchen esomeprazole (NEXIUM) 40 MG capsule 1 cap bid for severe gerd  60 capsule  11  . furosemide (LASIX) 40 MG tablet Take 1 tablet (40 mg total) by mouth daily.  60 tablet  1  . furosemide (LASIX) 40 MG tablet TAKE 1 TABLET BY MOUTH TWICE A DAY  60 tablet  5  . meloxicam (MOBIC) 15 MG tablet Take 1 tablet (  15 mg total) by mouth daily.  90 tablet  3  . Multiple Vitamin (MULTIVITAMIN) tablet Take 1 tablet by mouth daily. Vitamin supplement.      . sertraline (ZOLOFT) 25 MG tablet Take 1 tablet (25 mg total) by mouth 2 (two) times daily. For depression and anxiety.  180 tablet  3  . valsartan-hydrochlorothiazide (DIOVAN-HCT) 160-12.5 MG per tablet TAKE 1 TABLET BY MOUTH DIALY  90 tablet  3  . DISCONTD: clonazePAM (KLONOPIN) 0.5 MG tablet Take 1 tablet (0.5 mg total) by mouth 3 (three) times daily as needed for anxiety (For anxiety.).  90 tablet  2    BP 120/70  Temp 98.4 F (36.9 C) (Oral)  Wt 174 lb (78.926 kg)      Review of Systems  Constitutional: Negative.   HENT: Negative for hearing loss, congestion, sore throat, rhinorrhea, dental problem, sinus pressure and tinnitus.   Eyes: Negative for pain, discharge and visual disturbance.  Respiratory: Negative for cough and shortness of breath.     Cardiovascular: Negative for chest pain, palpitations and leg swelling.  Gastrointestinal: Negative for nausea, vomiting, abdominal pain, diarrhea, constipation, blood in stool and abdominal distention.  Genitourinary: Negative for dysuria, urgency, frequency, hematuria, flank pain, vaginal bleeding, vaginal discharge, difficulty urinating, vaginal pain and pelvic pain.  Musculoskeletal: Positive for myalgias, joint swelling and arthralgias. Negative for gait problem.  Skin: Negative for rash.  Neurological: Negative for dizziness, syncope, speech difficulty, weakness, numbness and headaches.  Hematological: Negative for adenopathy.  Psychiatric/Behavioral: Negative for behavioral problems, dysphoric mood and agitation. The patient is not nervous/anxious.        Objective:   Physical Exam  Constitutional: She is oriented to person, place, and time. She appears well-developed and well-nourished.  HENT:  Head: Normocephalic.  Right Ear: External ear normal.  Left Ear: External ear normal.  Mouth/Throat: Oropharynx is clear and moist.  Eyes: Conjunctivae and EOM are normal. Pupils are equal, round, and reactive to light.  Neck: Normal range of motion. Neck supple. No thyromegaly present.  Cardiovascular: Normal rate, regular rhythm, normal heart sounds and intact distal pulses.   Pulmonary/Chest: Effort normal and breath sounds normal.  Abdominal: Soft. Bowel sounds are normal. She exhibits no mass. There is no tenderness.  Musculoskeletal: Normal range of motion.       Trace right ankle edema No calf or popliteal tenderness  Lymphadenopathy:    She has no cervical adenopathy.  Neurological: She is alert and oriented to person, place, and time.  Skin: Skin is warm and dry. No rash noted.  Psychiatric: She has a normal mood and affect. Her behavior is normal.          Assessment & Plan:   HTN- well controlled DJD- R ankld edema- concerned about DVT- will check d dimer Neck  pain- no adenopathy

## 2012-06-06 LAB — D-DIMER, QUANTITATIVE: D-Dimer, Quant: 0.35 ug/mL-FEU (ref 0.00–0.48)

## 2012-06-09 ENCOUNTER — Other Ambulatory Visit (INDEPENDENT_AMBULATORY_CARE_PROVIDER_SITE_OTHER): Payer: BC Managed Care – PPO

## 2012-06-09 ENCOUNTER — Telehealth: Payer: Self-pay

## 2012-06-09 ENCOUNTER — Other Ambulatory Visit: Payer: Self-pay | Admitting: Internal Medicine

## 2012-06-09 DIAGNOSIS — R300 Dysuria: Secondary | ICD-10-CM

## 2012-06-09 LAB — POCT URINALYSIS DIPSTICK
Glucose, UA: NEGATIVE
Ketones, UA: NEGATIVE
Spec Grav, UA: 1.01
Urobilinogen, UA: 0.2

## 2012-06-09 MED ORDER — CIPROFLOXACIN HCL 500 MG PO TABS: 500.0000 mg | ORAL_TABLET | Freq: Two times a day (BID) | ORAL | Status: AC

## 2012-06-09 NOTE — Progress Notes (Signed)
Quick Note:  Spoke with pt- informed of results and med to be sent to [facility]  ______

## 2012-06-09 NOTE — Telephone Encounter (Signed)
Ok for ua

## 2012-06-09 NOTE — Telephone Encounter (Signed)
Spoke with pt- will come by for UA , colleen placed on lab schedule - gwenn aware

## 2012-06-09 NOTE — Telephone Encounter (Signed)
VM from pt - thinks she may have UTI - dysuria sx - wanting to come by and have urine run if possible. Just seen last week  Please advise

## 2012-06-16 ENCOUNTER — Other Ambulatory Visit: Payer: Self-pay

## 2012-06-16 MED ORDER — CLONAZEPAM 0.5 MG PO TABS: 0.5000 mg | ORAL_TABLET | Freq: Three times a day (TID) | ORAL | Status: DC | PRN

## 2012-07-31 ENCOUNTER — Encounter: Payer: Self-pay | Admitting: Internal Medicine

## 2012-07-31 ENCOUNTER — Other Ambulatory Visit: Payer: Self-pay

## 2012-07-31 ENCOUNTER — Ambulatory Visit (INDEPENDENT_AMBULATORY_CARE_PROVIDER_SITE_OTHER): Payer: BC Managed Care – PPO | Admitting: Internal Medicine

## 2012-07-31 VITALS — BP 128/80 | Temp 97.7°F | Wt 172.0 lb

## 2012-07-31 DIAGNOSIS — K219 Gastro-esophageal reflux disease without esophagitis: Secondary | ICD-10-CM

## 2012-07-31 DIAGNOSIS — K222 Esophageal obstruction: Secondary | ICD-10-CM

## 2012-07-31 DIAGNOSIS — I10 Essential (primary) hypertension: Secondary | ICD-10-CM

## 2012-07-31 DIAGNOSIS — E785 Hyperlipidemia, unspecified: Secondary | ICD-10-CM

## 2012-07-31 MED ORDER — VALSARTAN-HYDROCHLOROTHIAZIDE 160-12.5 MG PO TABS: 1.0000 | ORAL_TABLET | Freq: Every day | ORAL | Status: DC

## 2012-07-31 MED ORDER — CITALOPRAM HYDROBROMIDE 40 MG PO TABS: 40.0000 mg | ORAL_TABLET | Freq: Every day | ORAL | Status: DC

## 2012-07-31 MED ORDER — CLONAZEPAM 0.5 MG PO TABS: 0.5000 mg | ORAL_TABLET | Freq: Three times a day (TID) | ORAL | Status: DC | PRN

## 2012-07-31 MED ORDER — OMEPRAZOLE 40 MG PO CPDR: 40.0000 mg | DELAYED_RELEASE_CAPSULE | Freq: Every day | ORAL | Status: DC

## 2012-07-31 MED ORDER — FUROSEMIDE 40 MG PO TABS: 40.0000 mg | ORAL_TABLET | Freq: Every day | ORAL | Status: DC

## 2012-07-31 MED ORDER — MELOXICAM 15 MG PO TABS: 15.0000 mg | ORAL_TABLET | Freq: Every day | ORAL | Status: AC

## 2012-07-31 MED ORDER — MELOXICAM 15 MG PO TABS: 15.0000 mg | ORAL_TABLET | Freq: Every day | ORAL | Status: DC

## 2012-07-31 MED ORDER — SERTRALINE HCL 25 MG PO TABS: 25.0000 mg | ORAL_TABLET | Freq: Two times a day (BID) | ORAL | Status: DC

## 2012-07-31 NOTE — Patient Instructions (Signed)
Avoids foods high in acid such as tomatoes citrus juices, and spicy foods.  Avoid eating within two hours of lying down or before exercising.  Do not overheat.  Try smaller more frequent meals.  If symptoms persist, elevate the head of her bed 12 inches while sleeping.  Limit your sodium (Salt) intake  Return in 3 months for follow-up  

## 2012-07-31 NOTE — Progress Notes (Signed)
Subjective:    Patient ID: Julie Rice, female    DOB: [birth year], 76 y.o.   MRN: [id number]  HPI  76 year old patient who is seen today for followup. She has a history of treated hypertension. She has gastroesophageal reflux disease complicated by stricture formation. She has been on Nexium 40 mg twice a day and presently is in the donut hole and can no longer afford the medication. He Past Medical History  Diagnosis Date  . CAD (coronary artery disease)   . Hyperlipidemia   . TIA (transient ischemic attack)   . Hypertension   . COPD (chronic obstructive pulmonary disease)   . Depression   . Anemia   . Arthritis   . Lumbago   . Fecal occult blood test positive   . Rectal bleeding   . Internal hemorrhoids   . Esophageal stricture   . Hiatal hernia   . GERD (gastroesophageal reflux disease)   . Anxiety   . Spondylosis   . Mitral regurgitation   . PONV (postoperative nausea and vomiting)   . Chest pain     "@ rest, lying down, w/exertion"  . Dyspnea   . SOB (shortness of breath)     "@ rest, lying down, w/exertion"  . Blood transfusion     History   Social History  . Marital Status: Married    Spouse Name: N/A    Number of Children: N/A  . Years of Education: N/A   Occupational History  . Not on file.   Social History Main Topics  . Smoking status: Never Smoker   . Smokeless tobacco: Never Used  . Alcohol Use: No  . Drug Use: No  . Sexually Active: Not Currently   Other Topics Concern  . Not on file   Social History Narrative  . No narrative on file    Past Surgical History  Procedure Date  . Total knee arthroplasty ~ 1996    right  . Back surgery 1990; 1994; 1997;2000    "I've got 2 stainless steel rods; 6 screws; 2 ray cages"  . Tonsillectomy and adenoidectomy 1945  . Abdominal hysterectomy 1982  . Appendectomy     pt is unsure if she's had appy  . Tubal ligation ~ 1976  . Dilation and curettage of uterus 1961  . Cataract extraction w/  intraocular lens  implant, bilateral 2010  . Bladder suspension 1980's    Family History  Problem Relation Age of Onset  . Kidney disease Mother   . Heart disease Father     Allergies  Allergen Reactions  . Metaxalone   . Morphine     Current Outpatient Prescriptions on File Prior to Visit  Medication Sig Dispense Refill  . aspirin 81 MG tablet Take 1 tablet (81 mg total) by mouth daily. For cardiovascular health.      . citalopram (CELEXA) 40 MG tablet Take 40 mg by mouth daily.       . clonazePAM (KLONOPIN) 0.5 MG tablet Take 1 tablet (0.5 mg total) by mouth 3 (three) times daily as needed.  90 tablet  1  . furosemide (LASIX) 40 MG tablet Take 1 tablet (40 mg total) by mouth daily.  60 tablet  1  . furosemide (LASIX) 40 MG tablet TAKE 1 TABLET BY MOUTH TWICE A DAY  60 tablet  5  . meloxicam (MOBIC) 15 MG tablet Take 1 tablet (15 mg total) by mouth daily.  90 tablet  3  . Multiple Vitamin (MULTIVITAMIN) tablet  Take 1 tablet by mouth daily. Vitamin supplement.      . sertraline (ZOLOFT) 25 MG tablet Take 1 tablet (25 mg total) by mouth 2 (two) times daily. For depression and anxiety.  180 tablet  3  . valsartan-hydrochlorothiazide (DIOVAN-HCT) 160-12.5 MG per tablet TAKE 1 TABLET BY MOUTH DIALY  90 tablet  3  . esomeprazole (NEXIUM) 40 MG capsule 1 cap bid for severe gerd  60 capsule  11    BP 128/80  Temp 97.7 F (36.5 C) (Oral)  Wt 172 lb (78.019 kg)        Review of Systems  Constitutional: Negative.   HENT: Negative for hearing loss, congestion, sore throat, rhinorrhea, dental problem, sinus pressure and tinnitus.   Eyes: Negative for pain, discharge and visual disturbance.  Respiratory: Negative for cough and shortness of breath.   Cardiovascular: Negative for chest pain, palpitations and leg swelling.  Gastrointestinal: Negative for nausea, vomiting, abdominal pain, diarrhea, constipation, blood in stool and abdominal distention.       Reflux symptoms    Genitourinary: Negative for dysuria, urgency, frequency, hematuria, flank pain, vaginal bleeding, vaginal discharge, difficulty urinating, vaginal pain and pelvic pain.  Musculoskeletal: Negative for joint swelling, arthralgias and gait problem.  Skin: Negative for rash.  Neurological: Negative for dizziness, syncope, speech difficulty, weakness, numbness and headaches.  Hematological: Negative for adenopathy.  Psychiatric/Behavioral: Negative for behavioral problems, dysphoric mood and agitation. The patient is not nervous/anxious.        Objective:   Physical Exam  Constitutional: She is oriented to person, place, and time. She appears well-developed and well-nourished.  HENT:  Head: Normocephalic.  Right Ear: External ear normal.  Left Ear: External ear normal.  Mouth/Throat: Oropharynx is clear and moist.  Eyes: Conjunctivae normal and EOM are normal. Pupils are equal, round, and reactive to light.  Neck: Normal range of motion. Neck supple. No thyromegaly present.  Cardiovascular: Normal rate, regular rhythm, normal heart sounds and intact distal pulses.   Pulmonary/Chest: Effort normal and breath sounds normal.  Abdominal: Soft. Bowel sounds are normal. She exhibits no mass. There is no tenderness.  Musculoskeletal: Normal range of motion.  Lymphadenopathy:    She has no cervical adenopathy.  Neurological: She is alert and oriented to person, place, and time.  Skin: Skin is warm and dry. No rash noted.  Psychiatric: She has a normal mood and affect. Her behavior is normal.          Assessment & Plan:  Gastroesophageal reflux disease complicated by stricture formation. We'll give a trial of generic omeprazole 40 Antireflux regimen discussed Hypertension stable  [place] Written prescriptions dispensed Recheck 3 months

## 2012-08-14 ENCOUNTER — Telehealth: Payer: Self-pay | Admitting: Internal Medicine

## 2012-08-14 NOTE — Telephone Encounter (Signed)
Opened in error

## 2012-09-28 ENCOUNTER — Other Ambulatory Visit: Payer: Self-pay | Admitting: Internal Medicine

## 2012-10-14 ENCOUNTER — Other Ambulatory Visit: Payer: Self-pay | Admitting: *Deleted

## 2012-10-14 MED ORDER — CLONAZEPAM 0.5 MG PO TABS: 0.5000 mg | ORAL_TABLET | Freq: Three times a day (TID) | ORAL | Status: DC | PRN

## 2012-10-14 NOTE — Telephone Encounter (Signed)
Rx refill for Clonazepam printed and faxed to pharmacy.

## 2012-12-21 ENCOUNTER — Other Ambulatory Visit: Payer: Self-pay | Admitting: *Deleted

## 2012-12-21 MED ORDER — CLONAZEPAM 0.5 MG PO TABS: 0.5000 mg | ORAL_TABLET | Freq: Three times a day (TID) | ORAL | Status: DC | PRN

## 2012-12-21 NOTE — Telephone Encounter (Signed)
Pt notified Rx refill for Klonopin was called into pharmacy.

## 2013-01-24 ENCOUNTER — Other Ambulatory Visit: Payer: Self-pay | Admitting: Internal Medicine

## 2013-03-23 ENCOUNTER — Other Ambulatory Visit: Payer: Self-pay | Admitting: Internal Medicine

## 2013-05-20 ENCOUNTER — Other Ambulatory Visit: Payer: Self-pay | Admitting: Internal Medicine

## 2013-06-01 ENCOUNTER — Encounter: Payer: Self-pay | Admitting: Internal Medicine

## 2013-06-01 ENCOUNTER — Ambulatory Visit (INDEPENDENT_AMBULATORY_CARE_PROVIDER_SITE_OTHER): Payer: Medicare Other | Admitting: Internal Medicine

## 2013-06-01 VITALS — BP 160/90 | HR 73 | Temp 98.3°F | Resp 20 | Ht 63.5 in | Wt 169.0 lb

## 2013-06-01 DIAGNOSIS — D649 Anemia, unspecified: Secondary | ICD-10-CM

## 2013-06-01 DIAGNOSIS — Z23 Encounter for immunization: Secondary | ICD-10-CM

## 2013-06-01 DIAGNOSIS — M1990 Unspecified osteoarthritis, unspecified site: Secondary | ICD-10-CM

## 2013-06-01 DIAGNOSIS — E785 Hyperlipidemia, unspecified: Secondary | ICD-10-CM

## 2013-06-01 DIAGNOSIS — E876 Hypokalemia: Secondary | ICD-10-CM

## 2013-06-01 DIAGNOSIS — I10 Essential (primary) hypertension: Secondary | ICD-10-CM

## 2013-06-01 DIAGNOSIS — Z0000 Encounter for general adult medical examination without abnormal findings: Secondary | ICD-10-CM

## 2013-06-01 DIAGNOSIS — M549 Dorsalgia, unspecified: Secondary | ICD-10-CM

## 2013-06-01 DIAGNOSIS — E039 Hypothyroidism, unspecified: Secondary | ICD-10-CM

## 2013-06-01 LAB — CBC WITH DIFFERENTIAL/PLATELET
Basophils Relative: 1.1 % (ref 0.0–3.0)
Eosinophils Relative: 4.7 % (ref 0.0–5.0)
HCT: 35.9 % — ABNORMAL LOW (ref 36.0–46.0)
Lymphs Abs: 1.6 10*3/uL (ref 0.7–4.0)
MCV: 92.8 fl (ref 78.0–100.0)
Monocytes Absolute: 0.5 10*3/uL (ref 0.1–1.0)
Neutro Abs: 4.9 10*3/uL (ref 1.4–7.7)
RBC: 3.87 Mil/uL (ref 3.87–5.11)
WBC: 7.5 10*3/uL (ref 4.5–10.5)

## 2013-06-01 LAB — POCT URINALYSIS DIPSTICK
Ketones, UA: NEGATIVE
Protein, UA: NEGATIVE
Spec Grav, UA: 1.015
pH, UA: 7

## 2013-06-01 LAB — COMPREHENSIVE METABOLIC PANEL
AST: 15 U/L (ref 0–37)
Alkaline Phosphatase: 73 U/L (ref 39–117)
BUN: 21 mg/dL (ref 6–23)
Glucose, Bld: 89 mg/dL (ref 70–99)
Sodium: 141 mEq/L (ref 135–145)
Total Bilirubin: 0.5 mg/dL (ref 0.3–1.2)
Total Protein: 6.8 g/dL (ref 6.0–8.3)

## 2013-06-01 LAB — LIPID PANEL
HDL: 50.8 mg/dL (ref >39.00)
Triglycerides: 297 mg/dL — ABNORMAL HIGH (ref 0.0–149.0)
VLDL: 59.4 mg/dL — ABNORMAL HIGH (ref 0.0–40.0)

## 2013-06-01 LAB — LDL CHOLESTEROL, DIRECT: Direct LDL: 122.2 mg/dL

## 2013-06-01 MED ORDER — FUROSEMIDE 40 MG PO TABS: 40.0000 mg | ORAL_TABLET | Freq: Every day | ORAL | Status: DC

## 2013-06-01 NOTE — Progress Notes (Signed)
Patient ID: Julie Rice, female   DOB: [birth year], 77 y.o.   MRN: [id number]  Subjective:    Patient ID: Julie Rice, female    DOB: [birth year], 77 y.o.   MRN: [id number]  Hypertension Pertinent negatives include no chest pain, headaches, palpitations or shortness of breath.  Urinary Tract Infection  Pertinent negatives include no flank pain, frequency, hematuria, nausea, urgency or vomiting.  77 year-old patient who is seen today for a preventive health examination.  She is a former patient of Dr. Scotty Court. Medical problems include hypothyroidism dyslipidemia hypertension and chronic anxiety. She has coronary artery disease a history of gastroesophageal reflux disease and stricture formation she has osteoarthritis She was hospitalized about one year ago for treatment of major depression.  Today she physically is feeling well but under considerable situational stress. Her husband has cognitive dysfunction and paranoid ideation. He has been hospitalized for treatment of his psychiatric condition but still has been quite confused and disruptive. The patient has been forced to seek legal assistant stated to be obstructive the habits of her husband. She has a history of a tremor aggravated by anxiety. More recently this has done much better. ED records were reviewed  Social history- married; one son died in a auto accident. Another son deceased due to a suicide that  Past Medical History  Diagnosis Date  . CAD (coronary artery disease)   . Hyperlipidemia   . TIA (transient ischemic attack)   . Hypertension   . COPD (chronic obstructive pulmonary disease)   . Depression   . Anemia   . Arthritis   . Lumbago   . Fecal occult blood test positive   . Rectal bleeding   . Internal hemorrhoids   . Esophageal stricture   . Hiatal hernia   . GERD (gastroesophageal reflux disease)   . Anxiety   . Spondylosis   . Mitral regurgitation   . PONV (postoperative nausea and vomiting)   . Chest  pain     "@ rest, lying down, w/exertion"  . Dyspnea   . SOB (shortness of breath)     "@ rest, lying down, w/exertion"  . Blood transfusion     History   Social History  . Marital Status: Married    Spouse Name: N/A    Number of Children: N/A  . Years of Education: N/A   Occupational History  . Not on file.   Social History Main Topics  . Smoking status: Never Smoker   . Smokeless tobacco: Never Used  . Alcohol Use: No  . Drug Use: No  . Sexually Active: Not Currently   Other Topics Concern  . Not on file   Social History Narrative  . No narrative on file    Past Surgical History  Procedure Laterality Date  . Total knee arthroplasty  ~ 1996    right  . Back surgery  1990; 1994; 1997;2000    "I've got 2 stainless steel rods; 6 screws; 2 ray cages"  . Tonsillectomy and adenoidectomy  1945  . Abdominal hysterectomy  1982  . Appendectomy      pt is unsure if she's had appy  . Tubal ligation  ~ 1976  . Dilation and curettage of uterus  1961  . Cataract extraction w/ intraocular lens  implant, bilateral  2010  . Bladder suspension  1980's    Family History  Problem Relation Age of Onset  . Kidney disease Mother   . Heart disease Father  Allergies  Allergen Reactions  . Metaxalone   . Morphine     Current Outpatient Prescriptions on File Prior to Visit  Medication Sig Dispense Refill  . aspirin 81 MG tablet Take 1 tablet (81 mg total) by mouth daily. For cardiovascular health.      . citalopram (CELEXA) 40 MG tablet Take 1 tablet (40 mg total) by mouth daily.  90 tablet  4  . clonazePAM (KLONOPIN) 0.5 MG tablet TAKE 1 TABLET BY MOUTH 3 TIMES A DAY AS NEEDED  90 tablet  2  . furosemide (LASIX) 40 MG tablet Take 1 tablet (40 mg total) by mouth daily.  60 tablet  1  . meloxicam (MOBIC) 15 MG tablet Take 1 tablet (15 mg total) by mouth daily.  90 tablet  3  . Multiple Vitamin (MULTIVITAMIN) tablet Take 1 tablet by mouth daily. Vitamin supplement.      Marland Kitchen  omeprazole (PRILOSEC) 40 MG capsule TAKE ONE CAPSULE BY MOUTH EVERY DAY  30 capsule  2  . sertraline (ZOLOFT) 25 MG tablet Take 1 tablet (25 mg total) by mouth 2 (two) times daily. For depression and anxiety.  180 tablet  3  . valsartan-hydrochlorothiazide (DIOVAN-HCT) 160-12.5 MG per tablet Take 1 tablet by mouth daily.  90 tablet  3   No current facility-administered medications on file prior to visit.    BP 160/90  Pulse 73  Temp(Src) 98.3 F (36.8 C) (Oral)  Resp 20  Ht 5' 3.5" (1.613 m)  Wt 169 lb (76.658 kg)  BMI 29.46 kg/m2  SpO2 97%       Review of Systems  Constitutional: Negative.   HENT: Negative for hearing loss, congestion, sore throat, rhinorrhea, dental problem, sinus pressure and tinnitus.   Eyes: Negative for pain, discharge and visual disturbance.  Respiratory: Negative for cough and shortness of breath.   Cardiovascular: Negative for chest pain, palpitations and leg swelling.  Gastrointestinal: Negative for nausea, vomiting, abdominal pain, diarrhea, constipation, blood in stool and abdominal distention.       Remote history peptic ulcer disease in the 60s  Colonoscopy 2010  Genitourinary: Negative for dysuria, urgency, frequency, hematuria, flank pain, vaginal bleeding, vaginal discharge, difficulty urinating, vaginal pain and pelvic pain.  Musculoskeletal: Negative for joint swelling, arthralgias and gait problem.  Skin: Negative for rash.  Neurological: Negative for dizziness, syncope, speech difficulty, weakness, numbness and headaches.  Hematological: Negative for adenopathy.  Psychiatric/Behavioral: Negative for behavioral problems, dysphoric mood and agitation. The patient is nervous/anxious.        Objective:   Physical Exam  Constitutional: She is oriented to person, place, and time. She appears well-developed and well-nourished.  HENT:  Head: Normocephalic.  Right Ear: External ear normal.  Left Ear: External ear normal.  Mouth/Throat:  Oropharynx is clear and moist.  Eyes: Conjunctivae and EOM are normal. Pupils are equal, round, and reactive to light.  Neck: Normal range of motion. Neck supple. No thyromegaly present.  Cardiovascular: Normal rate, regular rhythm, normal heart sounds and intact distal pulses.   Diminished left dorsalis pedis pulse  Pulmonary/Chest: Effort normal and breath sounds normal.  Abdominal: Soft. Bowel sounds are normal. She exhibits no mass. There is no tenderness.  Liver edge palpable on inspiration  Musculoskeletal: Normal range of motion.  Lymphadenopathy:    She has no cervical adenopathy.  Neurological: She is alert and oriented to person, place, and time.  Decreased vibratory sensation and monofilament testing of the feet  Skin: Skin is warm and dry.  No rash noted.  Scar right knee  Psychiatric: She has a normal mood and affect. Her behavior is normal.          Assessment & Plan:  Preventive health examination History major depression stable Hypertension Osteoarthritis Hypothyroidism  Will check updated lab Continue present regimen Recheck 6 months or as needed

## 2013-06-01 NOTE — Patient Instructions (Signed)
Limit your sodium (Salt) intake    It is important that you exercise regularly, at least 20 minutes 3 to 4 times per week.  If you develop chest pain or shortness of breath seek  medical attention.  Please check your blood pressure on a regular basis.  If it is consistently greater than 150/90, please make an office appointment.  Return in 6 months for follow-up   

## 2013-06-07 ENCOUNTER — Telehealth: Payer: Self-pay | Admitting: *Deleted

## 2013-06-07 NOTE — Telephone Encounter (Signed)
Pt called for Lab results from 7/29 told pt results not reviewed yet and Dr.K is out of the office till Wed will get back to her then. Pt verbalized understanding.

## 2013-06-23 ENCOUNTER — Other Ambulatory Visit: Payer: Self-pay | Admitting: Internal Medicine

## 2013-06-24 NOTE — Telephone Encounter (Signed)
ok 

## 2013-06-28 ENCOUNTER — Other Ambulatory Visit: Payer: Self-pay | Admitting: Internal Medicine

## 2013-07-23 ENCOUNTER — Other Ambulatory Visit: Payer: Self-pay | Admitting: Internal Medicine

## 2013-08-17 ENCOUNTER — Encounter: Payer: Self-pay | Admitting: Internal Medicine

## 2013-08-17 ENCOUNTER — Ambulatory Visit (INDEPENDENT_AMBULATORY_CARE_PROVIDER_SITE_OTHER): Payer: Medicare Other | Admitting: Internal Medicine

## 2013-08-17 VITALS — BP 130/80 | HR 98 | Temp 98.0°F | Resp 20 | Wt 171.0 lb

## 2013-08-17 DIAGNOSIS — D649 Anemia, unspecified: Secondary | ICD-10-CM

## 2013-08-17 DIAGNOSIS — Z23 Encounter for immunization: Secondary | ICD-10-CM

## 2013-08-17 DIAGNOSIS — N390 Urinary tract infection, site not specified: Secondary | ICD-10-CM

## 2013-08-17 DIAGNOSIS — M1990 Unspecified osteoarthritis, unspecified site: Secondary | ICD-10-CM

## 2013-08-17 DIAGNOSIS — I10 Essential (primary) hypertension: Secondary | ICD-10-CM

## 2013-08-17 DIAGNOSIS — R0602 Shortness of breath: Secondary | ICD-10-CM

## 2013-08-17 LAB — POCT URINALYSIS DIPSTICK
Bilirubin, UA: NEGATIVE
Spec Grav, UA: 1.01
Urobilinogen, UA: 0.2
pH, UA: 6

## 2013-08-17 LAB — CBC WITH DIFFERENTIAL/PLATELET
Eosinophils Relative: 4 % (ref 0.0–5.0)
Lymphocytes Relative: 23.8 % (ref 12.0–46.0)
MCV: 91.9 fl (ref 78.0–100.0)
Monocytes Absolute: 0.5 10*3/uL (ref 0.1–1.0)
Monocytes Relative: 7.1 % (ref 3.0–12.0)
Neutrophils Relative %: 64.1 % (ref 43.0–77.0)
Platelets: 252 10*3/uL (ref 150.0–400.0)
RBC: 3.93 Mil/uL (ref 3.87–5.11)
WBC: 7.4 10*3/uL (ref 4.5–10.5)

## 2013-08-17 MED ORDER — MELOXICAM 15 MG PO TABS: 15.0000 mg | ORAL_TABLET | Freq: Every day | ORAL | Status: DC

## 2013-08-17 MED ORDER — FUROSEMIDE 40 MG PO TABS: 40.0000 mg | ORAL_TABLET | Freq: Every day | ORAL | Status: DC

## 2013-08-17 MED ORDER — VALSARTAN-HYDROCHLOROTHIAZIDE 160-12.5 MG PO TABS: 1.0000 | ORAL_TABLET | Freq: Every day | ORAL | Status: DC

## 2013-08-17 MED ORDER — CLONAZEPAM 0.5 MG PO TABS: ORAL_TABLET | ORAL | Status: DC

## 2013-08-17 MED ORDER — OMEPRAZOLE 40 MG PO CPDR: DELAYED_RELEASE_CAPSULE | ORAL | Status: DC

## 2013-08-17 MED ORDER — SERTRALINE HCL 25 MG PO TABS: 25.0000 mg | ORAL_TABLET | Freq: Two times a day (BID) | ORAL | Status: DC

## 2013-08-17 MED ORDER — CIPROFLOXACIN HCL 500 MG PO TABS: 500.0000 mg | ORAL_TABLET | Freq: Two times a day (BID) | ORAL | Status: DC

## 2013-08-17 NOTE — Progress Notes (Signed)
Subjective:    Patient ID: Julie Rice, female    DOB: 18-May-1936, 77 y.o.   MRN: [id number]  HPI  77 year old patient who has a history of hypertension anxiety hypothyroidism. She is seen today for her three-month followup. Her blood pressure has been well-controlled. She did have a laboratory screen in July which revealed mild anemia. She has required a number of esophageal dilatations.  Past Medical History  Diagnosis Date  . CAD (coronary artery disease)   . Hyperlipidemia   . TIA (transient ischemic attack)   . Hypertension   . COPD (chronic obstructive pulmonary disease)   . Depression   . Anemia   . Arthritis   . Lumbago   . Fecal occult blood test positive   . Rectal bleeding   . Internal hemorrhoids   . Esophageal stricture   . Hiatal hernia   . GERD (gastroesophageal reflux disease)   . Anxiety   . Spondylosis   . Mitral regurgitation   . PONV (postoperative nausea and vomiting)   . Chest pain     "@ rest, lying down, w/exertion"  . Dyspnea   . SOB (shortness of breath)     "@ rest, lying down, w/exertion"  . Blood transfusion     History   Social History  . Marital Status: Married    Spouse Name: N/A    Number of Children: N/A  . Years of Education: N/A   Occupational History  . Not on file.   Social History Main Topics  . Smoking status: Never Smoker   . Smokeless tobacco: Never Used  . Alcohol Use: No  . Drug Use: No  . Sexual Activity: Not Currently   Other Topics Concern  . Not on file   Social History Narrative  . No narrative on file    Past Surgical History  Procedure Laterality Date  . Total knee arthroplasty  ~ 1996    right  . Back surgery  1990; 1994; 1997;2000    "I've got 2 stainless steel rods; 6 screws; 2 ray cages"  . Tonsillectomy and adenoidectomy  1945  . Abdominal hysterectomy  1982  . Appendectomy      pt is unsure if she's had appy  . Tubal ligation  ~ 1976  . Dilation and curettage of uterus  1961  .  Cataract extraction w/ intraocular lens  implant, bilateral  2010  . Bladder suspension  1980's    Family History  Problem Relation Age of Onset  . Kidney disease Mother   . Heart disease Father     Allergies  Allergen Reactions  . Metaxalone   . Morphine     Current Outpatient Prescriptions on File Prior to Visit  Medication Sig Dispense Refill  . aspirin 81 MG tablet Take 1 tablet (81 mg total) by mouth daily. For cardiovascular health.      . Multiple Vitamin (MULTIVITAMIN) tablet Take 1 tablet by mouth daily. Vitamin supplement.       No current facility-administered medications on file prior to visit.    BP 130/80  Pulse 98  Temp(Src) 98 F (36.7 C) (Oral)  Resp 20  Wt 171 lb (77.565 kg)  BMI 29.81 kg/m2  SpO2 95%       Review of Systems  Constitutional: Negative.   HENT: Negative for congestion, dental problem, hearing loss, rhinorrhea, sinus pressure, sore throat and tinnitus.   Eyes: Negative for pain, discharge and visual disturbance.  Respiratory: Negative for cough and shortness  of breath.   Cardiovascular: Negative for chest pain, palpitations and leg swelling.  Gastrointestinal: Negative for nausea, vomiting, abdominal pain, diarrhea, constipation, blood in stool and abdominal distention.  Genitourinary: Positive for dysuria and frequency. Negative for urgency, hematuria, flank pain, vaginal bleeding, vaginal discharge, difficulty urinating, vaginal pain and pelvic pain.  Musculoskeletal: Negative for arthralgias, gait problem and joint swelling.  Skin: Negative for rash.  Neurological: Negative for dizziness, syncope, speech difficulty, weakness, numbness and headaches.  Hematological: Negative for adenopathy.  Psychiatric/Behavioral: Negative for behavioral problems, dysphoric mood and agitation. The patient is nervous/anxious.        Objective:   Physical Exam  Constitutional: She is oriented to person, place, and time. She appears  well-developed and well-nourished.  HENT:  Head: Normocephalic.  Right Ear: External ear normal.  Left Ear: External ear normal.  Mouth/Throat: Oropharynx is clear and moist.  Eyes: Conjunctivae and EOM are normal. Pupils are equal, round, and reactive to light.  Neck: Normal range of motion. Neck supple. No thyromegaly present.  Cardiovascular: Normal rate, regular rhythm, normal heart sounds and intact distal pulses.   Pulmonary/Chest: Effort normal and breath sounds normal.  Abdominal: Soft. Bowel sounds are normal. She exhibits no mass. There is no tenderness.  Musculoskeletal: Normal range of motion.  Lymphadenopathy:    She has no cervical adenopathy.  Neurological: She is alert and oriented to person, place, and time.  Skin: Skin is warm and dry. No rash noted.  Psychiatric: She has a normal mood and affect. Her behavior is normal.          Assessment & Plan:   Hypertension well controlled History of mild anemia. We'll check a followup CBC History UTI with some intermittent dysuria. We'll check a UA Anxiety depression stable  Recheck 6 months

## 2013-08-17 NOTE — Patient Instructions (Signed)
Limit your sodium (Salt) intake    It is important that you exercise regularly, at least 20 minutes 3 to 4 times per week.  If you develop chest pain or shortness of breath seek  medical attention.  Return in 6 months for follow-up  

## 2014-04-06 ENCOUNTER — Encounter: Payer: Self-pay | Admitting: Internal Medicine

## 2014-04-11 ENCOUNTER — Encounter: Payer: Self-pay | Admitting: Internal Medicine

## 2014-04-11 ENCOUNTER — Ambulatory Visit (INDEPENDENT_AMBULATORY_CARE_PROVIDER_SITE_OTHER): Payer: Medicare Other | Admitting: Internal Medicine

## 2014-04-11 VITALS — BP 136/90 | HR 73 | Temp 98.6°F | Resp 20 | Ht 63.5 in | Wt 169.0 lb

## 2014-04-11 DIAGNOSIS — M1990 Unspecified osteoarthritis, unspecified site: Secondary | ICD-10-CM

## 2014-04-11 DIAGNOSIS — M25552 Pain in left hip: Secondary | ICD-10-CM

## 2014-04-11 DIAGNOSIS — I10 Essential (primary) hypertension: Secondary | ICD-10-CM

## 2014-04-11 DIAGNOSIS — D649 Anemia, unspecified: Secondary | ICD-10-CM

## 2014-04-11 DIAGNOSIS — M25561 Pain in right knee: Secondary | ICD-10-CM

## 2014-04-11 DIAGNOSIS — M25551 Pain in right hip: Secondary | ICD-10-CM

## 2014-04-11 DIAGNOSIS — M25562 Pain in left knee: Secondary | ICD-10-CM

## 2014-04-11 DIAGNOSIS — M25559 Pain in unspecified hip: Secondary | ICD-10-CM

## 2014-04-11 DIAGNOSIS — G8929 Other chronic pain: Secondary | ICD-10-CM

## 2014-04-11 DIAGNOSIS — M25569 Pain in unspecified knee: Secondary | ICD-10-CM

## 2014-04-11 DIAGNOSIS — F411 Generalized anxiety disorder: Secondary | ICD-10-CM

## 2014-04-11 LAB — CBC WITH DIFFERENTIAL/PLATELET
BASOS ABS: 0.1 10*3/uL (ref 0.0–0.1)
BASOS PCT: 0.7 % (ref 0.0–3.0)
Eosinophils Absolute: 0.3 10*3/uL (ref 0.0–0.7)
Eosinophils Relative: 2.9 % (ref 0.0–5.0)
HCT: 35.6 % — ABNORMAL LOW (ref 36.0–46.0)
HEMOGLOBIN: 11.8 g/dL — AB (ref 12.0–15.0)
Lymphocytes Relative: 18.5 % (ref 12.0–46.0)
Lymphs Abs: 1.7 10*3/uL (ref 0.7–4.0)
MCHC: 33.1 g/dL (ref 30.0–36.0)
MCV: 91.9 fl (ref 78.0–100.0)
MONOS PCT: 6.4 % (ref 3.0–12.0)
Monocytes Absolute: 0.6 10*3/uL (ref 0.1–1.0)
NEUTROS ABS: 6.7 10*3/uL (ref 1.4–7.7)
Neutrophils Relative %: 71.5 % (ref 43.0–77.0)
Platelets: 263 10*3/uL (ref 150.0–400.0)
RBC: 3.88 Mil/uL (ref 3.87–5.11)
RDW: 13.3 % (ref 11.5–15.5)
WBC: 9.3 10*3/uL (ref 4.0–10.5)

## 2014-04-11 LAB — SEDIMENTATION RATE: SED RATE: 20 mm/h (ref 0–22)

## 2014-04-11 NOTE — Patient Instructions (Signed)
Limit your sodium (Salt) intake  Return in one month for follow-up 

## 2014-04-11 NOTE — Progress Notes (Signed)
Pre-visit discussion using our clinic review tool. No additional management support is needed unless otherwise documented below in the visit note.  

## 2014-04-11 NOTE — Progress Notes (Signed)
Subjective:    Patient ID: Julie Rice, female    DOB: 1936/03/17, 78 y.o.   MRN: [id number]  HPI  78 year old patient has chronic medical issues include osteoarthritis, anxiety disorder, hypertension.  She is seen today with multiple complaints including fatigue, arthralgias, paresthesias.  She also complains of a rash involving the malar facial region and the bridge of the nose that is intermittent.  She states that she has had this in the past and was evaluated for S. L. E. and RA.  No early morning stiffness.  She also states that she has a prior history of fibromyalgia.  She is on Mobic for osteoarthritis.  She states the facial rash is worse at night and not related to sun exposure. She also has a number of dermatological concerns.  He  Past Medical History  Diagnosis Date  . CAD (coronary artery disease)   . Hyperlipidemia   . TIA (transient ischemic attack)   . Hypertension   . COPD (chronic obstructive pulmonary disease)   . Depression   . Anemia   . Arthritis   . Lumbago   . Fecal occult blood test positive   . Rectal bleeding   . Internal hemorrhoids   . Esophageal stricture   . Hiatal hernia   . GERD (gastroesophageal reflux disease)   . Anxiety   . Spondylosis   . Mitral regurgitation   . PONV (postoperative nausea and vomiting)   . Chest pain     "@ rest, lying down, w/exertion"  . Dyspnea   . SOB (shortness of breath)     "@ rest, lying down, w/exertion"  . Blood transfusion     History   Social History  . Marital Status: Married    Spouse Name: N/A    Number of Children: N/A  . Years of Education: N/A   Occupational History  . Not on file.   Social History Main Topics  . Smoking status: Never Smoker   . Smokeless tobacco: Never Used  . Alcohol Use: No  . Drug Use: No  . Sexual Activity: Not Currently   Other Topics Concern  . Not on file   Social History Narrative  . No narrative on file    Past Surgical History  Procedure Laterality  Date  . Total knee arthroplasty  ~ 1996    right  . Back surgery  1990; 1994; 1997;2000    "I've got 2 stainless steel rods; 6 screws; 2 ray cages"  . Tonsillectomy and adenoidectomy  1945  . Abdominal hysterectomy  1982  . Appendectomy      pt is unsure if she's had appy  . Tubal ligation  ~ 1976  . Dilation and curettage of uterus  1961  . Cataract extraction w/ intraocular lens  implant, bilateral  2010  . Bladder suspension  1980's    Family History  Problem Relation Age of Onset  . Kidney disease Mother   . Heart disease Father     Allergies  Allergen Reactions  . Metaxalone   . Morphine     Current Outpatient Prescriptions on File Prior to Visit  Medication Sig Dispense Refill  . aspirin 81 MG tablet Take 1 tablet (81 mg total) by mouth daily. For cardiovascular health.      . clonazePAM (KLONOPIN) 0.5 MG tablet TAKE 1 TABLET BY MOUTH 3 TIMES A DAY AS NEEDED  270 tablet  2  . furosemide (LASIX) 40 MG tablet Take 1 tablet (40 mg  total) by mouth daily.  90 tablet  2  . meloxicam (MOBIC) 15 MG tablet Take 1 tablet (15 mg total) by mouth daily.  90 tablet  2  . Multiple Vitamin (MULTIVITAMIN) tablet Take 1 tablet by mouth daily. Vitamin supplement.      Marland Kitchen omeprazole (PRILOSEC) 40 MG capsule TAKE ONE CAPSULE BY MOUTH EVERY DAY  90 capsule  2  . sertraline (ZOLOFT) 25 MG tablet Take 1 tablet (25 mg total) by mouth 2 (two) times daily. For depression and anxiety.  180 tablet  2  . valsartan-hydrochlorothiazide (DIOVAN-HCT) 160-12.5 MG per tablet Take 1 tablet by mouth daily.  90 tablet  2   No current facility-administered medications on file prior to visit.    BP 136/90  Pulse 73  Temp(Src) 98.6 F (37 C) (Oral)  Resp 20  Ht 5' 3.5" (1.613 m)  Wt 169 lb (76.658 kg)  BMI 29.46 kg/m2  SpO2 97%       Review of Systems  Constitutional: Positive for fatigue.  HENT: Negative for congestion, dental problem, hearing loss, rhinorrhea, sinus pressure, sore throat and  tinnitus.   Eyes: Negative for pain, discharge and visual disturbance.  Respiratory: Negative for cough and shortness of breath.   Cardiovascular: Negative for chest pain, palpitations and leg swelling.  Gastrointestinal: Negative for nausea, vomiting, abdominal pain, diarrhea, constipation, blood in stool and abdominal distention.  Genitourinary: Negative for dysuria, urgency, frequency, hematuria, flank pain, vaginal bleeding, vaginal discharge, difficulty urinating, vaginal pain and pelvic pain.  Musculoskeletal: Positive for arthralgias and back pain. Negative for gait problem and joint swelling.  Skin: Positive for rash.  Neurological: Negative for dizziness, syncope, speech difficulty, weakness, numbness and headaches.  Hematological: Negative for adenopathy.  Psychiatric/Behavioral: Negative for behavioral problems, dysphoric mood and agitation. The patient is nervous/anxious.        Objective:   Physical Exam  Constitutional: She is oriented to person, place, and time. She appears well-developed and well-nourished.  HENT:  Head: Normocephalic.  Right Ear: External ear normal.  Left Ear: External ear normal.  Mouth/Throat: Oropharynx is clear and moist.  Eyes: Conjunctivae and EOM are normal. Pupils are equal, round, and reactive to light.  Neck: Normal range of motion. Neck supple. No thyromegaly present.  Cardiovascular: Normal rate, regular rhythm, normal heart sounds and intact distal pulses.   Pulmonary/Chest: Effort normal and breath sounds normal.  Abdominal: Soft. Bowel sounds are normal. She exhibits no mass. There is no tenderness.  Musculoskeletal: Normal range of motion.  Lymphadenopathy:    She has no cervical adenopathy.  Neurological: She is alert and oriented to person, place, and time.  Skin: Skin is warm and dry. No rash noted.  No facial rash Multiple benign skin lesions involving the neck area, as well as the scalp line of the facial region Lipoma  involving the left lateral ankle region  Psychiatric: She has a normal mood and affect. Her behavior is normal.          Assessment & Plan:   Osteoarthritis.  We'll continue Mobic.  In view of her symptoms of fatigue and worsening joint, pain.  We'll check a sedimentation rate History of facial rash.  None present now Hypertension stable Anxiety disorder.  Information concerning dermatology referral.  Dispensed Check sedimentation rate Followup one month or as needed

## 2014-04-12 ENCOUNTER — Telehealth: Payer: Self-pay | Admitting: Internal Medicine

## 2014-04-12 NOTE — Telephone Encounter (Signed)
Relevant patient education assigned to patient using Emmi. ° °

## 2014-04-14 ENCOUNTER — Telehealth: Payer: Self-pay | Admitting: *Deleted

## 2014-04-14 NOTE — Telephone Encounter (Signed)
Please call/notify patient that lab/test/procedure is normal 

## 2014-04-14 NOTE — Telephone Encounter (Signed)
Spoke to pt she is wanting lab results from the other day. Please advise.

## 2014-04-14 NOTE — Telephone Encounter (Signed)
Spoke to pt told her labs normal per Dr. Kirtland Bouchard. Pt said no anemia. Told pt Hemoglobin was 11.8 stable no worse. Pt verbalized understanding.

## 2014-04-25 ENCOUNTER — Telehealth: Payer: Self-pay | Admitting: Internal Medicine

## 2014-04-25 ENCOUNTER — Other Ambulatory Visit: Payer: Self-pay

## 2014-04-25 DIAGNOSIS — Z1231 Encounter for screening mammogram for malignant neoplasm of breast: Secondary | ICD-10-CM

## 2014-04-25 MED ORDER — VALSARTAN-HYDROCHLOROTHIAZIDE 160-12.5 MG PO TABS: 1.0000 | ORAL_TABLET | Freq: Every day | ORAL | Status: DC

## 2014-04-25 MED ORDER — OMEPRAZOLE 40 MG PO CPDR: DELAYED_RELEASE_CAPSULE | ORAL | Status: DC

## 2014-04-25 MED ORDER — CLONAZEPAM 0.5 MG PO TABS: ORAL_TABLET | ORAL | Status: DC

## 2014-04-25 NOTE — Telephone Encounter (Signed)
[id number] - [place], [place] - [address] is requesting re-fills on the following:   clonazePAM (KLONOPIN) 0.5 MG tablet omeprazole (PRILOSEC) 40 MG capsule valsartan-hydrochlorothiazide (DIOVAN-HCT) 160-12.5 MG per tablet

## 2014-04-25 NOTE — Telephone Encounter (Signed)
Pt notified Rx's done. 

## 2014-05-10 ENCOUNTER — Ambulatory Visit: Payer: Self-pay

## 2014-05-10 ENCOUNTER — Ambulatory Visit
Admission: RE | Admit: 2014-05-10 | Discharge: 2014-05-10 | Disposition: A | Discharge status: Home / Self Care | Payer: Medicare Other | Source: Ambulatory Visit

## 2014-05-10 DIAGNOSIS — Z1231 Encounter for screening mammogram for malignant neoplasm of breast: Secondary | ICD-10-CM

## 2014-06-07 ENCOUNTER — Ambulatory Visit (INDEPENDENT_AMBULATORY_CARE_PROVIDER_SITE_OTHER): Payer: Medicare Other | Admitting: Internal Medicine

## 2014-06-07 ENCOUNTER — Encounter: Payer: Self-pay | Admitting: Internal Medicine

## 2014-06-07 VITALS — BP 130/78 | HR 84 | Ht 62.0 in | Wt 162.4 lb

## 2014-06-07 DIAGNOSIS — K222 Esophageal obstruction: Secondary | ICD-10-CM

## 2014-06-07 DIAGNOSIS — K219 Gastro-esophageal reflux disease without esophagitis: Secondary | ICD-10-CM

## 2014-06-07 DIAGNOSIS — R1310 Dysphagia, unspecified: Secondary | ICD-10-CM

## 2014-06-07 NOTE — Patient Instructions (Signed)

## 2014-06-07 NOTE — Progress Notes (Signed)
HISTORY OF PRESENT ILLNESS:  Julie Rice is a 78 y.o. female with multiple medical problems as listed below. She has been followed in this office for chronic GERD complicated by peptic stricture requiring esophageal dilation. She has not been seen in 6 years. Her last upper endoscopy was performed September 2009. She was found to have a distal esophageal stricture, hiatal hernia, gastric polyps, and gastritis. Her esophagus was dilated with 54 [place] Maloney dilator. She has been on omeprazole 40 mg daily for reflux. She reports a one-year history of recurrent intermittent solid food dysphagia to item such as meat. Also reports some breakthrough pyrosis at night for which she takes antacids. GI review of systems is otherwise negative. She did undergo complete colonoscopy August 2008. This was normal. Her chronic medical problems are stable  REVIEW OF SYSTEMS:  All non-GI ROS negative except for anxiety, arthritis, back pain, depression, fatigue, shortness of breath, ankle swelling, urinary leakage  Past Medical History  Diagnosis Date  . CAD (coronary artery disease)   . Hyperlipidemia   . TIA (transient ischemic attack)   . Hypertension   . COPD (chronic obstructive pulmonary disease)   . Depression   . Anemia   . Arthritis   . Lumbago   . Fecal occult blood test positive   . Rectal bleeding   . Internal hemorrhoids   . Esophageal stricture   . Hiatal hernia   . GERD (gastroesophageal reflux disease)   . Anxiety   . Spondylosis   . Mitral regurgitation   . PONV (postoperative nausea and vomiting)   . Chest pain     "@ rest, lying down, w/exertion"  . Dyspnea   . SOB (shortness of breath)     "@ rest, lying down, w/exertion"  . Blood transfusion   . Fibromyalgia   . Bipolar disorder     Past Surgical History  Procedure Laterality Date  . Total knee arthroplasty Right ~ 1996  . Lumbar laminectomy  1990; 1994; [id number]    "I've got 2 stainless steel rods; 6 screws; 2  ray cages"took bone from right hip to put in back  . Tonsillectomy and adenoidectomy  1945  . Abdominal hysterectomy  1982  . Appendectomy      pt is unsure if she's had appy  . Tubal ligation  ~ 1976  . Dilation and curettage of uterus  1961  . Cataract extraction w/ intraocular lens  implant, bilateral Bilateral 2010  . Bladder suspension  1980's    Social History Julie Rice  reports that she has never smoked. She has never used smokeless tobacco. She reports that she does not drink alcohol or use illicit drugs.  family history includes Heart disease (age of onset: 30) in her father; Hypertension in her mother; Kidney disease in her mother; Suicidality in her son.  Allergies  Allergen Reactions  . Metaxalone   . Morphine        PHYSICAL EXAMINATION: Vital signs: BP 130/78  Pulse 84  Ht 5\' 2"  (1.575 m)  Wt 162 lb 6 oz (73.653 kg)  BMI 29.69 kg/m2 General: Well-developed, well-nourished, no acute distress HEENT: Sclerae are anicteric, conjunctiva pink. Oral mucosa intact Lungs: Clear Heart: Regular Abdomen: soft, nontender, nondistended, no obvious ascites, no peritoneal signs, normal bowel sounds. No organomegaly. Extremities: No edema Psychiatric: alert and oriented x3. Cooperative     ASSESSMENT:  #1. GERD. Spirits he breakthrough symptoms #2. One-year history of recurrent intermittent solid food dysphagia. Most likely secondary  to previously diagnosed peptic stricture #3. Normal colonoscopy 2008 #4. Multiple medical problems. Stable  PLAN:  #1. Reflux precautions #2. Continue PPI #3. Upper endoscopy with esophageal dilation.The nature of the procedure, as well as the risks, benefits, and alternatives were carefully and thoroughly reviewed with the patient. Ample time for discussion and questions allowed. The patient understood, was satisfied, and agreed to proceed.

## 2014-06-16 ENCOUNTER — Telehealth: Payer: Self-pay | Admitting: Internal Medicine

## 2014-06-16 ENCOUNTER — Encounter: Payer: Self-pay | Admitting: Internal Medicine

## 2014-06-16 MED ORDER — MELOXICAM 15 MG PO TABS: 15.0000 mg | ORAL_TABLET | Freq: Every day | ORAL | Status: DC

## 2014-06-16 NOTE — Telephone Encounter (Signed)
[id number] [place], [place] - [address] is requesting re-fill on meloxicam (MOBIC) 15 MG tablet

## 2014-06-16 NOTE — Telephone Encounter (Signed)
Rx sent 

## 2014-07-14 ENCOUNTER — Ambulatory Visit (INDEPENDENT_AMBULATORY_CARE_PROVIDER_SITE_OTHER): Payer: Medicare Other | Admitting: Family Medicine

## 2014-07-14 ENCOUNTER — Encounter: Payer: Self-pay | Admitting: Family Medicine

## 2014-07-14 VITALS — BP 134/76 | HR 84 | Temp 97.8°F | Wt 164.0 lb

## 2014-07-14 DIAGNOSIS — F4323 Adjustment disorder with mixed anxiety and depressed mood: Secondary | ICD-10-CM

## 2014-07-14 MED ORDER — SERTRALINE HCL 50 MG PO TABS: ORAL_TABLET | ORAL | Status: DC

## 2014-07-14 NOTE — Patient Instructions (Signed)
Increase Sertraline to 50 mg one and one half tablet daily Consider setting up counseling.

## 2014-07-14 NOTE — Progress Notes (Signed)
Subjective:    Patient ID: Julie Rice, female    DOB: 26-Nov-1935, 78 y.o.   MRN: [id number]  Dizziness Pertinent negatives include no abdominal pain, chest pain or weakness.   Patient seen with nonspecific symptoms of lightheadedness. She's noted this off and on for several days. She's also had some recurrent dysphagia and has had prior history of esophageal stricture. She thinks her dizziness might be related to emotional stress. Recently, her husband of 57 years left her. She's had previous counseling in the past. She has long history of anxiety and takes clonazepam and is also currently on sertraline 25 mg twice daily. She has some depressed mood but mostly anxiousness. No suicidal ideation.  Denies any chest pains. No syncope or presyncopal symptoms. No vertigo. No focal weakness  Past Medical History  Diagnosis Date  . CAD (coronary artery disease)   . Hyperlipidemia   . TIA (transient ischemic attack)   . Hypertension   . COPD (chronic obstructive pulmonary disease)   . Depression   . Anemia   . Arthritis   . Lumbago   . Fecal occult blood test positive   . Rectal bleeding   . Internal hemorrhoids   . Esophageal stricture   . Hiatal hernia   . GERD (gastroesophageal reflux disease)   . Anxiety   . Spondylosis   . Mitral regurgitation   . PONV (postoperative nausea and vomiting)   . Chest pain     "@ rest, lying down, w/exertion"  . Dyspnea   . SOB (shortness of breath)     "@ rest, lying down, w/exertion"  . Blood transfusion   . Fibromyalgia   . Bipolar disorder    Past Surgical History  Procedure Laterality Date  . Total knee arthroplasty Right ~ 1996  . Lumbar laminectomy  1990; 1994; [id number]    "I've got 2 stainless steel rods; 6 screws; 2 ray cages"took bone from right hip to put in back  . Tonsillectomy and adenoidectomy  1945  . Abdominal hysterectomy  1982  . Appendectomy      pt is unsure if she's had appy  . Tubal ligation  ~ 1976  .  Dilation and curettage of uterus  1961  . Cataract extraction w/ intraocular lens  implant, bilateral Bilateral 2010  . Bladder suspension  1980's    reports that she has never smoked. She has never used smokeless tobacco. She reports that she does not drink alcohol or use illicit drugs. family history includes Heart disease (age of onset: 58) in her father; Hypertension in her mother; Kidney disease in her mother; Suicidality in her son. Allergies  Allergen Reactions  . Metaxalone   . Morphine       Review of Systems  Constitutional: Negative for appetite change and unexpected weight change.  Respiratory: Negative for shortness of breath.   Cardiovascular: Negative for chest pain.  Gastrointestinal: Negative for abdominal pain.  Genitourinary: Negative for dysuria.  Neurological: Positive for dizziness and light-headedness. Negative for tremors, seizures, syncope and weakness.  Hematological: Negative for adenopathy. Does not bruise/bleed easily.  Psychiatric/Behavioral: The patient is nervous/anxious.        Objective:   Physical Exam  Constitutional: She is oriented to person, place, and time. She appears well-developed and well-nourished.  HENT:  Mouth/Throat: Oropharynx is clear and moist.  Neck: Neck supple. No thyromegaly present.  Cardiovascular: Normal rate and regular rhythm.   Pulmonary/Chest: Effort normal and breath sounds normal. No respiratory distress. She  has no wheezes. She has no rales.  Musculoskeletal: She exhibits no edema.  Neurological: She is alert and oriented to person, place, and time.  Psychiatric: She has a normal mood and affect. Her behavior is normal.          Assessment & Plan:  Nonspecific somatic symptoms. Increased stress and anxiety which may be related (to her physical symptoms). Nonfocal exam. We recommend titration of sertraline to 75 mg daily. Will switch to 50 mg take one half tablet daily. Continue as needed, as above.  Recommended she consider counseling and number given for her to set up.

## 2014-07-14 NOTE — Progress Notes (Signed)
Pre visit review using our clinic review tool, if applicable. No additional management support is needed unless otherwise documented below in the visit note. 

## 2014-07-15 ENCOUNTER — Telehealth: Payer: Self-pay | Admitting: Internal Medicine

## 2014-07-15 NOTE — Telephone Encounter (Signed)
No

## 2014-07-19 ENCOUNTER — Encounter: Payer: Self-pay | Admitting: Internal Medicine

## 2014-08-17 ENCOUNTER — Other Ambulatory Visit: Payer: Self-pay | Admitting: Internal Medicine

## 2014-09-02 ENCOUNTER — Ambulatory Visit (INDEPENDENT_AMBULATORY_CARE_PROVIDER_SITE_OTHER): Payer: Medicare Other | Admitting: Internal Medicine

## 2014-09-02 ENCOUNTER — Encounter: Payer: Self-pay | Admitting: Internal Medicine

## 2014-09-02 VITALS — BP 140/80 | HR 85 | Temp 97.6°F | Resp 20 | Ht 62.0 in | Wt 165.0 lb

## 2014-09-02 DIAGNOSIS — M150 Primary generalized (osteo)arthritis: Secondary | ICD-10-CM

## 2014-09-02 DIAGNOSIS — M159 Polyosteoarthritis, unspecified: Secondary | ICD-10-CM

## 2014-09-02 DIAGNOSIS — F411 Generalized anxiety disorder: Secondary | ICD-10-CM

## 2014-09-02 DIAGNOSIS — F330 Major depressive disorder, recurrent, mild: Secondary | ICD-10-CM

## 2014-09-02 DIAGNOSIS — I10 Essential (primary) hypertension: Secondary | ICD-10-CM

## 2014-09-02 DIAGNOSIS — M8949 Other hypertrophic osteoarthropathy, multiple sites: Secondary | ICD-10-CM

## 2014-09-02 DIAGNOSIS — Z23 Encounter for immunization: Secondary | ICD-10-CM

## 2014-09-02 MED ORDER — SERTRALINE HCL 50 MG PO TABS: ORAL_TABLET | ORAL | Status: DC

## 2014-09-02 MED ORDER — FUROSEMIDE 40 MG PO TABS: ORAL_TABLET | ORAL | Status: DC

## 2014-09-02 MED ORDER — MELOXICAM 15 MG PO TABS: 15.0000 mg | ORAL_TABLET | Freq: Every day | ORAL | Status: DC

## 2014-09-02 MED ORDER — OMEPRAZOLE 40 MG PO CPDR: DELAYED_RELEASE_CAPSULE | ORAL | Status: DC

## 2014-09-02 MED ORDER — VALSARTAN-HYDROCHLOROTHIAZIDE 160-12.5 MG PO TABS: 1.0000 | ORAL_TABLET | Freq: Every day | ORAL | Status: DC

## 2014-09-02 MED ORDER — CLONAZEPAM 0.5 MG PO TABS: ORAL_TABLET | ORAL | Status: DC

## 2014-09-02 NOTE — Patient Instructions (Signed)
Limit your sodium (Salt) intake  Please check your blood pressure on a regular basis.  If it is consistently greater than 150/90, please make an office appointment.    It is important that you exercise regularly, at least 20 minutes 3 to 4 times per week.  If you develop chest pain or shortness of breath seek  medical attention.  Return in 6 months for follow-up  

## 2014-09-02 NOTE — Progress Notes (Signed)
Subjective:    Patient ID: Julie Rice, female    DOB: Feb 23, 1936, 78 y.o.   MRN: [id number]  HPI  78 year old patient who is seen today in follow-up.  She has a history of adjustment disorder with anxiety and depressed mood.  Zoloft has recently been increased and she feels she has had a nice benefit.  She has osteoarthritis and treated hypertension.  She is doing reasonably well.  [place]  She needs medication refills.  Her blood pressure has been well controlled.  Her arthritis has been stable.  Past Medical History  Diagnosis Date  . CAD (coronary artery disease)   . Hyperlipidemia   . TIA (transient ischemic attack)   . Hypertension   . COPD (chronic obstructive pulmonary disease)   . Depression   . Anemia   . Arthritis   . Lumbago   . Fecal occult blood test positive   . Rectal bleeding   . Internal hemorrhoids   . Esophageal stricture   . Hiatal hernia   . GERD (gastroesophageal reflux disease)   . Anxiety   . Spondylosis   . Mitral regurgitation   . PONV (postoperative nausea and vomiting)   . Chest pain     "@ rest, lying down, w/exertion"  . Dyspnea   . SOB (shortness of breath)     "@ rest, lying down, w/exertion"  . Blood transfusion   . Fibromyalgia   . Bipolar disorder     History   Social History  . Marital Status: Married    Spouse Name: N/A    Number of Children: 2 D  . Years of Education: N/A   Occupational History  .     Social History Main Topics  . Smoking status: Never Smoker   . Smokeless tobacco: Never Used  . Alcohol Use: No  . Drug Use: No  . Sexual Activity: Not Currently   Other Topics Concern  . Not on file   Social History Narrative  . No narrative on file    Past Surgical History  Procedure Laterality Date  . Total knee arthroplasty Right ~ 1996  . Lumbar laminectomy  1990; 1994; [id number]    "I've got 2 stainless steel rods; 6 screws; 2 ray cages"took bone  from right hip to put in back  . Tonsillectomy and adenoidectomy  1945  . Abdominal hysterectomy  1982  . Appendectomy      pt is unsure if she's had appy  . Tubal ligation  ~ 1976  . Dilation and curettage of uterus  1961  . Cataract extraction w/ intraocular lens  implant, bilateral Bilateral 2010  . Bladder suspension  1980's    Family History  Problem Relation Age of Onset  . Kidney disease Mother   . Heart disease Father 43    MI  . Suicidality Son   . Hypertension Mother     Allergies  Allergen Reactions  . Metaxalone   . Morphine     Current Outpatient Prescriptions on File Prior to Visit  Medication Sig Dispense Refill  . aspirin 81 MG tablet Take 1 tablet (81 mg total) by mouth daily. For cardiovascular health.      . CRANBERRY PO Take 1 tablet by mouth daily.      . Multiple Vitamin (MULTIVITAMIN) tablet Take 1 tablet by mouth daily. Vitamin supplement.       No current facility-administered medications on file  prior to visit.    BP 140/80  Pulse 85  Temp(Src) 97.6 F (36.4 C) (Oral)  Resp 20  Ht 5\' 2"  (1.575 m)  Wt 165 lb (74.844 kg)  BMI 30.17 kg/m2  SpO2 98%     Review of Systems  Constitutional: Negative.   HENT: Negative for congestion, dental problem, hearing loss, rhinorrhea, sinus pressure, sore throat and tinnitus.   Eyes: Negative for pain, discharge and visual disturbance.  Respiratory: Negative for cough and shortness of breath.   Cardiovascular: Negative for chest pain, palpitations and leg swelling.  Gastrointestinal: Negative for nausea, vomiting, abdominal pain, diarrhea, constipation, blood in stool and abdominal distention.  Genitourinary: Negative for dysuria, urgency, frequency, hematuria, flank pain, vaginal bleeding, vaginal discharge, difficulty urinating, vaginal pain and pelvic pain.  Musculoskeletal: Positive for arthralgias and gait problem. Negative for joint swelling.  Skin: Negative for rash.  Neurological: Negative for  dizziness, syncope, speech difficulty, weakness, numbness and headaches.  Hematological: Negative for adenopathy.  Psychiatric/Behavioral: Positive for dysphoric mood. Negative for behavioral problems and agitation. The patient is nervous/anxious.        Objective:   Physical Exam  Constitutional: She is oriented to person, place, and time. She appears well-developed and well-nourished.  HENT:  Head: Normocephalic.  Right Ear: External ear normal.  Left Ear: External ear normal.  Mouth/Throat: Oropharynx is clear and moist.  Eyes: Conjunctivae and EOM are normal. Pupils are equal, round, and reactive to light.  Neck: Normal range of motion. Neck supple. No thyromegaly present.  Cardiovascular: Normal rate, regular rhythm, normal heart sounds and intact distal pulses.   Pulmonary/Chest: Effort normal and breath sounds normal.  Abdominal: Soft. Bowel sounds are normal. She exhibits no mass. There is no tenderness.  Musculoskeletal: Normal range of motion.  Lymphadenopathy:    She has no cervical adenopathy.  Neurological: She is alert and oriented to person, place, and time.  Skin: Skin is warm and dry. No rash noted.  Psychiatric: She has a normal mood and affect. Her behavior is normal.          Assessment & Plan:   Adjustment disorder with depressed mood improved Hypertension, well controlled Osteoarthritis.  Recently stable.  We'll add Tylenol to her regimen of meloxicam  CPX 6 months

## 2014-09-05 ENCOUNTER — Telehealth: Payer: Self-pay | Admitting: Internal Medicine

## 2014-09-05 NOTE — Telephone Encounter (Signed)
emmi emailed °

## 2014-10-23 ENCOUNTER — Other Ambulatory Visit: Payer: Self-pay | Admitting: Internal Medicine

## 2015-01-31 ENCOUNTER — Other Ambulatory Visit: Payer: Self-pay | Admitting: *Deleted

## 2015-02-07 NOTE — Telephone Encounter (Signed)
Left message to call office, pt left message on my voicemail about Valsartan-HCTZ not being covered by insurance.

## 2015-03-02 ENCOUNTER — Other Ambulatory Visit: Payer: Self-pay | Admitting: Internal Medicine

## 2015-03-12 ENCOUNTER — Encounter: Payer: Self-pay | Admitting: Internal Medicine

## 2015-03-13 NOTE — Telephone Encounter (Signed)
Pt sent My Chart message and I responded back.

## 2015-04-03 ENCOUNTER — Other Ambulatory Visit: Payer: Self-pay | Admitting: Internal Medicine

## 2015-04-11 ENCOUNTER — Other Ambulatory Visit: Payer: Self-pay | Admitting: *Deleted

## 2015-04-11 ENCOUNTER — Ambulatory Visit (INDEPENDENT_AMBULATORY_CARE_PROVIDER_SITE_OTHER): Payer: Medicare Other | Admitting: Internal Medicine

## 2015-04-11 ENCOUNTER — Encounter: Payer: Self-pay | Admitting: Internal Medicine

## 2015-04-11 VITALS — BP 160/88 | HR 74 | Temp 98.0°F | Resp 18 | Ht 62.0 in | Wt 165.0 lb

## 2015-04-11 DIAGNOSIS — M8949 Other hypertrophic osteoarthropathy, multiple sites: Secondary | ICD-10-CM

## 2015-04-11 DIAGNOSIS — I10 Essential (primary) hypertension: Secondary | ICD-10-CM

## 2015-04-11 DIAGNOSIS — E785 Hyperlipidemia, unspecified: Secondary | ICD-10-CM | POA: Diagnosis not present

## 2015-04-11 DIAGNOSIS — R5383 Other fatigue: Secondary | ICD-10-CM | POA: Diagnosis not present

## 2015-04-11 DIAGNOSIS — F329 Major depressive disorder, single episode, unspecified: Secondary | ICD-10-CM | POA: Diagnosis not present

## 2015-04-11 DIAGNOSIS — M150 Primary generalized (osteo)arthritis: Secondary | ICD-10-CM | POA: Diagnosis not present

## 2015-04-11 DIAGNOSIS — M159 Polyosteoarthritis, unspecified: Secondary | ICD-10-CM

## 2015-04-11 LAB — CBC WITH DIFFERENTIAL/PLATELET
BASOS ABS: 0.1 10*3/uL (ref 0.0–0.1)
BASOS PCT: 0.9 % (ref 0.0–3.0)
EOS ABS: 0.3 10*3/uL (ref 0.0–0.7)
Eosinophils Relative: 3.7 % (ref 0.0–5.0)
HEMATOCRIT: 32 % — AB (ref 36.0–46.0)
Hemoglobin: 10.6 g/dL — ABNORMAL LOW (ref 12.0–15.0)
LYMPHS PCT: 20.8 % (ref 12.0–46.0)
Lymphs Abs: 1.6 10*3/uL (ref 0.7–4.0)
MCHC: 33.1 g/dL (ref 30.0–36.0)
MCV: 86.1 fl (ref 78.0–100.0)
Monocytes Absolute: 0.5 10*3/uL (ref 0.1–1.0)
Monocytes Relative: 6 % (ref 3.0–12.0)
NEUTROS ABS: 5.2 10*3/uL (ref 1.4–7.7)
Neutrophils Relative %: 68.6 % (ref 43.0–77.0)
PLATELETS: 269 10*3/uL (ref 150.0–400.0)
RBC: 3.72 Mil/uL — ABNORMAL LOW (ref 3.87–5.11)
RDW: 13.9 % (ref 11.5–15.5)
WBC: 7.6 10*3/uL (ref 4.0–10.5)

## 2015-04-11 LAB — COMPREHENSIVE METABOLIC PANEL
ALBUMIN: 4.2 g/dL (ref 3.5–5.2)
ALK PHOS: 72 U/L (ref 39–117)
ALT: 12 U/L (ref 0–35)
AST: 16 U/L (ref 0–37)
BUN: 27 mg/dL — ABNORMAL HIGH (ref 6–23)
CALCIUM: 9.1 mg/dL (ref 8.4–10.5)
CO2: 32 mEq/L (ref 19–32)
CREATININE: 1.07 mg/dL (ref 0.40–1.20)
Chloride: 99 mEq/L (ref 96–112)
GFR: 52.81 mL/min — ABNORMAL LOW (ref >60.00)
Glucose, Bld: 91 mg/dL (ref 70–99)
Potassium: 3.7 mEq/L (ref 3.5–5.1)
SODIUM: 137 meq/L (ref 135–145)
Total Bilirubin: 0.4 mg/dL (ref 0.2–1.2)
Total Protein: 6.8 g/dL (ref 6.0–8.3)

## 2015-04-11 LAB — TSH: TSH: 1.69 u[IU]/mL (ref 0.35–4.50)

## 2015-04-11 LAB — LIPID PANEL
Cholesterol: 211 mg/dL — ABNORMAL HIGH (ref 0–200)
HDL: 54.3 mg/dL (ref >39.00)
LDL CALC: 124 mg/dL — AB (ref 0–99)
NonHDL: 156.7
Total CHOL/HDL Ratio: 4
Triglycerides: 162 mg/dL — ABNORMAL HIGH (ref 0.0–149.0)
VLDL: 32.4 mg/dL (ref 0.0–40.0)

## 2015-04-11 MED ORDER — FUROSEMIDE 40 MG PO TABS: 40.0000 mg | ORAL_TABLET | Freq: Every day | ORAL | Status: DC

## 2015-04-11 MED ORDER — SERTRALINE HCL 50 MG PO TABS: 75.0000 mg | ORAL_TABLET | Freq: Every day | ORAL | Status: DC

## 2015-04-11 MED ORDER — OMEPRAZOLE 40 MG PO CPDR: DELAYED_RELEASE_CAPSULE | ORAL | Status: DC

## 2015-04-11 MED ORDER — CLONAZEPAM 0.5 MG PO TABS: ORAL_TABLET | ORAL | Status: DC

## 2015-04-11 MED ORDER — MELOXICAM 15 MG PO TABS: 15.0000 mg | ORAL_TABLET | Freq: Every day | ORAL | Status: DC

## 2015-04-11 MED ORDER — BUPROPION HCL ER (SR) 150 MG PO TB12: 150.0000 mg | ORAL_TABLET | Freq: Two times a day (BID) | ORAL | Status: DC

## 2015-04-11 NOTE — Progress Notes (Signed)
Pre visit review using our clinic review tool, if applicable. No additional management support is needed unless otherwise documented below in the visit note. 

## 2015-04-11 NOTE — Patient Instructions (Signed)
Consider behavioral health consultation    It is important that you exercise regularly, at least 20 minutes 3 to 4 times per week.  If you develop chest pain or shortness of breath seek  medical attention.  Return in 6 weeks for follow-up

## 2015-04-11 NOTE — Progress Notes (Signed)
Subjective:    Patient ID: [name], female    DOB: [birth year], 79 y.o.   MRN: [id number]  HPI  79 year old patient who is seen today after a 8 month absence.  She has multiple complaints.  She presents with a 2 page narrative as well as a list of 7 concerns.  This includes pain from the neck to the feet every day Right-sided chest wall pain since a fall on April 1 Numbness underneath the toes of both feet Shortness of breath Back pain She also complains of worsening depression since her husband passed away recently  Medical problems include osteoarthritis and history of major depression.  She has hypothyroidism and dyslipidemia.  She is treated for essential hypertension.  Past Medical History  Diagnosis Date  . CAD (coronary artery disease)   . Hyperlipidemia   . TIA (transient ischemic attack)   . Hypertension   . COPD (chronic obstructive pulmonary disease)   . Depression   . Anemia   . Arthritis   . Lumbago   . Fecal occult blood test positive   . Rectal bleeding   . Internal hemorrhoids   . Esophageal stricture   . Hiatal hernia   . GERD (gastroesophageal reflux disease)   . Anxiety   . Spondylosis   . Mitral regurgitation   . PONV (postoperative nausea and vomiting)   . Chest pain     "@ rest, lying down, w/exertion"  . Dyspnea   . SOB (shortness of breath)     "@ rest, lying down, w/exertion"  . Blood transfusion   . Fibromyalgia   . Bipolar disorder     History   Social History  . Marital Status: Married    Spouse Name: N/A  . Number of Children: 2 D  . Years of Education: N/A   Occupational History  .     Social History Main Topics  . Smoking status: Never Smoker   . Smokeless tobacco: Never Used  . Alcohol Use: No  . Drug Use: No  . Sexual Activity: Not Currently   Other Topics Concern  . Not on file   Social History Narrative    Past Surgical History  Procedure Laterality Date  . Total knee arthroplasty Right ~ 1996  .  Lumbar laminectomy  1990; 1994; [id number]    "I've got 2 stainless steel rods; 6 screws; 2 ray cages"took bone from right hip to put in back  . Tonsillectomy and adenoidectomy  1945  . Abdominal hysterectomy  1982  . Appendectomy      pt is unsure if she's had appy  . Tubal ligation  ~ 1976  . Dilation and curettage of uterus  1961  . Cataract extraction w/ intraocular lens  implant, bilateral Bilateral 2010  . Bladder suspension  1980's    Family History  Problem Relation Age of Onset  . Kidney disease Mother   . Heart disease Father 19    MI  . Suicidality Son   . Hypertension Mother     Allergies  Allergen Reactions  . Metaxalone   . Morphine     Current Outpatient Prescriptions on File Prior to Visit  Medication Sig Dispense Refill  . aspirin 81 MG tablet Take 1 tablet (81 mg total) by mouth daily. For cardiovascular health.    . clonazePAM (KLONOPIN) 0.5 MG tablet TAKE 1 TABLET BY MOUTH 3 TIMES A DAY AS NEEDED 270 tablet 1  . CRANBERRY PO Take 1 tablet by mouth  daily.    . furosemide (LASIX) 40 MG tablet TAKE 1 TABLET BY MOUTH EVERY DAY 90 tablet 1  . meloxicam (MOBIC) 15 MG tablet Take 1 tablet (15 mg total) by mouth daily. 90 tablet 1  . Multiple Vitamin (MULTIVITAMIN) tablet Take 1 tablet by mouth daily. Vitamin supplement.    Marland Kitchen omeprazole (PRILOSEC) 40 MG capsule TAKE ONE CAPSULE BY MOUTH EVERY DAY 90 capsule 1  . sertraline (ZOLOFT) 50 MG tablet TAKE 1 & 1/2 TABLETS BY MOUTH EVERY DAY 135 tablet 1  . TRAVATAN Z 0.004 % SOLN ophthalmic solution Place 1 drop into both eyes at bedtime.     . valsartan-hydrochlorothiazide (DIOVAN-HCT) 160-12.5 MG per tablet Take 1 tablet by mouth daily. 90 tablet 1   No current facility-administered medications on file prior to visit.    BP 160/88 mmHg  Pulse 74  Temp(Src) 98 F (36.7 C) (Oral)  Resp 18  Ht 5\' 2"  (1.575 m)  Wt 165 lb (74.844 kg)  BMI 30.17 kg/m2  SpO2 98%     Review of Systems  Constitutional: Positive  for fatigue.  HENT: Negative for congestion, dental problem, hearing loss, rhinorrhea, sinus pressure, sore throat and tinnitus.   Eyes: Negative for pain, discharge and visual disturbance.  Respiratory: Positive for shortness of breath. Negative for cough.   Cardiovascular: Negative for chest pain, palpitations and leg swelling.  Gastrointestinal: Negative for nausea, vomiting, abdominal pain, diarrhea, constipation, blood in stool and abdominal distention.  Genitourinary: Negative for dysuria, urgency, frequency, hematuria, flank pain, vaginal bleeding, vaginal discharge, difficulty urinating, vaginal pain and pelvic pain.  Musculoskeletal: Positive for back pain and arthralgias. Negative for joint swelling and gait problem.  Skin: Negative for rash.  Neurological: Positive for weakness. Negative for dizziness, syncope, speech difficulty, numbness and headaches.  Hematological: Negative for adenopathy.  Psychiatric/Behavioral: Negative for behavioral problems, dysphoric mood and agitation. The patient is not nervous/anxious.        Objective:   Physical Exam  Constitutional: She is oriented to person, place, and time. She appears well-developed and well-nourished. No distress.  HENT:  Head: Normocephalic.  Right Ear: External ear normal.  Left Ear: External ear normal.  Mouth/Throat: Oropharynx is clear and moist.  Eyes: Conjunctivae and EOM are normal. Pupils are equal, round, and reactive to light.  Neck: Normal range of motion. Neck supple. No thyromegaly present.  Cardiovascular: Normal rate, regular rhythm, normal heart sounds and intact distal pulses.   Diminished left dorsalis pedis pulse  Pulmonary/Chest: Effort normal and breath sounds normal. No respiratory distress. She has no wheezes. She has no rales.  O2 saturation 98%  Abdominal: Soft. Bowel sounds are normal. She exhibits no mass. There is no tenderness.  Musculoskeletal: Normal range of motion.  Lymphadenopathy:     She has no cervical adenopathy.  Neurological: She is alert and oriented to person, place, and time.  Skin: Skin is warm and dry. No rash noted.  Psychiatric: Her behavior is normal.  Depressed mood          Assessment & Plan:   Clinical depression.  Will augment with Wellbutrin.  Recheck 6 weeks.  Behavioral health referral.  Discussed and encouraged Hypertension Dyslipidemia Osteoarthritis Grief reaction  We'll check screening lab

## 2015-04-12 LAB — SEDIMENTATION RATE: Sed Rate: 25 mm/hr — ABNORMAL HIGH (ref 0–22)

## 2015-04-17 ENCOUNTER — Ambulatory Visit (INDEPENDENT_AMBULATORY_CARE_PROVIDER_SITE_OTHER): Payer: No Typology Code available for payment source | Admitting: Psychology

## 2015-04-17 DIAGNOSIS — F341 Dysthymic disorder: Secondary | ICD-10-CM | POA: Diagnosis not present

## 2015-04-26 ENCOUNTER — Ambulatory Visit (INDEPENDENT_AMBULATORY_CARE_PROVIDER_SITE_OTHER): Payer: No Typology Code available for payment source | Admitting: Psychology

## 2015-04-26 DIAGNOSIS — F341 Dysthymic disorder: Secondary | ICD-10-CM

## 2015-04-27 ENCOUNTER — Other Ambulatory Visit: Payer: Self-pay | Admitting: Internal Medicine

## 2015-05-01 ENCOUNTER — Other Ambulatory Visit: Payer: Self-pay | Admitting: Internal Medicine

## 2015-05-11 ENCOUNTER — Ambulatory Visit (INDEPENDENT_AMBULATORY_CARE_PROVIDER_SITE_OTHER): Payer: No Typology Code available for payment source | Admitting: Psychology

## 2015-05-11 DIAGNOSIS — F341 Dysthymic disorder: Secondary | ICD-10-CM

## 2015-05-23 ENCOUNTER — Ambulatory Visit (INDEPENDENT_AMBULATORY_CARE_PROVIDER_SITE_OTHER): Payer: Medicare Other | Admitting: Internal Medicine

## 2015-05-23 ENCOUNTER — Encounter: Payer: Self-pay | Admitting: Internal Medicine

## 2015-05-23 VITALS — BP 156/90 | HR 84 | Temp 98.3°F | Resp 20 | Ht 62.0 in | Wt 167.0 lb

## 2015-05-23 DIAGNOSIS — E785 Hyperlipidemia, unspecified: Secondary | ICD-10-CM

## 2015-05-23 DIAGNOSIS — E039 Hypothyroidism, unspecified: Secondary | ICD-10-CM

## 2015-05-23 DIAGNOSIS — F331 Major depressive disorder, recurrent, moderate: Secondary | ICD-10-CM | POA: Diagnosis not present

## 2015-05-23 DIAGNOSIS — D649 Anemia, unspecified: Secondary | ICD-10-CM

## 2015-05-23 DIAGNOSIS — I10 Essential (primary) hypertension: Secondary | ICD-10-CM | POA: Diagnosis not present

## 2015-05-23 LAB — CBC WITH DIFFERENTIAL/PLATELET
BASOS ABS: 0.1 10*3/uL (ref 0.0–0.1)
Basophils Relative: 0.8 % (ref 0.0–3.0)
EOS ABS: 0.5 10*3/uL (ref 0.0–0.7)
Eosinophils Relative: 5.6 % — ABNORMAL HIGH (ref 0.0–5.0)
HEMATOCRIT: 32.3 % — AB (ref 36.0–46.0)
HEMOGLOBIN: 10.4 g/dL — AB (ref 12.0–15.0)
LYMPHS PCT: 14.9 % (ref 12.0–46.0)
Lymphs Abs: 1.4 10*3/uL (ref 0.7–4.0)
MCHC: 32.3 g/dL (ref 30.0–36.0)
MCV: 87.9 fl (ref 78.0–100.0)
Monocytes Absolute: 0.5 10*3/uL (ref 0.1–1.0)
Monocytes Relative: 5.6 % (ref 3.0–12.0)
Neutro Abs: 6.8 10*3/uL (ref 1.4–7.7)
Neutrophils Relative %: 73.1 % (ref 43.0–77.0)
PLATELETS: 300 10*3/uL (ref 150.0–400.0)
RBC: 3.68 Mil/uL — ABNORMAL LOW (ref 3.87–5.11)
RDW: 15.6 % — ABNORMAL HIGH (ref 11.5–15.5)
WBC: 9.4 10*3/uL (ref 4.0–10.5)

## 2015-05-23 NOTE — Patient Instructions (Signed)
Behavioral health follow-up as scheduled    It is important that you exercise regularly, at least 20 minutes 3 to 4 times per week.  If you develop chest pain or shortness of breath seek  medical attention.  Return in 3 months for follow-up

## 2015-05-23 NOTE — Progress Notes (Signed)
Pre visit review using our clinic review tool, if applicable. No additional management support is needed unless otherwise documented below in the visit note. 

## 2015-05-23 NOTE — Progress Notes (Signed)
Subjective:    Patient ID: Julie Rice, female    DOB: July 25, 1936, 80 y.o.   MRN: [id number]  HPI 79 year old patient who is seen today for follow-up.  She has a history of major depression and was seen about 6 weeks ago with worsening depression, anxiety and multiple somatic complaints.  She has been adjusting to the recent death of her husband.  She has been seen by behavioral health 3 or 4 times over this interval.  She feels improved but still quite anxious.  Laboratory screen was unremarkable except for some mild anemia.  Last colonoscopy 2010   Past Medical History  Diagnosis Date  . CAD (coronary artery disease)   . Hyperlipidemia   . TIA (transient ischemic attack)   . Hypertension   . COPD (chronic obstructive pulmonary disease)   . Depression   . Anemia   . Arthritis   . Lumbago   . Fecal occult blood test positive   . Rectal bleeding   . Internal hemorrhoids   . Esophageal stricture   . Hiatal hernia   . GERD (gastroesophageal reflux disease)   . Anxiety   . Spondylosis   . Mitral regurgitation   . PONV (postoperative nausea and vomiting)   . Chest pain     "@ rest, lying down, w/exertion"  . Dyspnea   . SOB (shortness of breath)     "@ rest, lying down, w/exertion"  . Blood transfusion   . Fibromyalgia   . Bipolar disorder     History   Social History  . Marital Status: Married    Spouse Name: N/A  . Number of Children: 2 D  . Years of Education: N/A   Occupational History  .     Social History Main Topics  . Smoking status: Never Smoker   . Smokeless tobacco: Never Used  . Alcohol Use: No  . Drug Use: No  . Sexual Activity: Not Currently   Other Topics Concern  . Not on file   Social History Narrative    Past Surgical History  Procedure Laterality Date  . Total knee arthroplasty Right ~ 1996  . Lumbar laminectomy  1990; 1994; [id number]    "I've got 2 stainless steel rods; 6 screws; 2 ray cages"took bone from right hip to put in  back  . Tonsillectomy and adenoidectomy  1945  . Abdominal hysterectomy  1982  . Appendectomy      pt is unsure if she's had appy  . Tubal ligation  ~ 1976  . Dilation and curettage of uterus  1961  . Cataract extraction w/ intraocular lens  implant, bilateral Bilateral 2010  . Bladder suspension  1980's    Family History  Problem Relation Age of Onset  . Kidney disease Mother   . Heart disease Father 102    MI  . Suicidality Son   . Hypertension Mother     Allergies  Allergen Reactions  . Metaxalone   . Morphine     Current Outpatient Prescriptions on File Prior to Visit  Medication Sig Dispense Refill  . aspirin 81 MG tablet Take 1 tablet (81 mg total) by mouth daily. For cardiovascular health.    Marland Kitchen buPROPion (WELLBUTRIN SR) 150 MG 12 hr tablet Take 1 tablet (150 mg total) by mouth 2 (two) times daily. 90 tablet 2  . clonazePAM (KLONOPIN) 0.5 MG tablet TAKE 1 TABLET BY MOUTH 3 TIMES A DAY AS NEEDED 270 tablet 1  . CRANBERRY PO Take  1 tablet by mouth daily.    . furosemide (LASIX) 40 MG tablet Take 1 tablet (40 mg total) by mouth daily. 90 tablet 3  . meloxicam (MOBIC) 15 MG tablet Take 1 tablet (15 mg total) by mouth daily. 90 tablet 1  . Multiple Vitamin (MULTIVITAMIN) tablet Take 1 tablet by mouth daily. Vitamin supplement.    Marland Kitchen omeprazole (PRILOSEC) 40 MG capsule TAKE ONE CAPSULE BY MOUTH EVERY DAY 90 capsule 1  . sertraline (ZOLOFT) 50 MG tablet Take 1.5 tablets (75 mg total) by mouth daily. 135 tablet 1  . TRAVATAN Z 0.004 % SOLN ophthalmic solution Place 1 drop into both eyes at bedtime.     . valsartan-hydrochlorothiazide (DIOVAN-HCT) 160-12.5 MG per tablet TAKE 1 TABLET BY MOUTH EVERY DAY 90 tablet 1   No current facility-administered medications on file prior to visit.    BP 156/90 mmHg  Pulse 84  Temp(Src) 98.3 F (36.8 C) (Oral)  Resp 20  Ht 5\' 2"  (1.575 m)  Wt 167 lb (75.751 kg)  BMI 30.54 kg/m2  SpO2 98%      Review of Systems  Constitutional:  Negative.   HENT: Negative for congestion, dental problem, hearing loss, rhinorrhea, sinus pressure, sore throat and tinnitus.   Eyes: Negative for pain, discharge and visual disturbance.  Respiratory: Negative for cough and shortness of breath.   Cardiovascular: Negative for chest pain, palpitations and leg swelling.  Gastrointestinal: Negative for nausea, vomiting, abdominal pain, diarrhea, constipation, blood in stool and abdominal distention.  Genitourinary: Negative for dysuria, urgency, frequency, hematuria, flank pain, vaginal bleeding, vaginal discharge, difficulty urinating, vaginal pain and pelvic pain.  Musculoskeletal: Negative for joint swelling, arthralgias and gait problem.  Skin: Negative for rash.  Neurological: Negative for dizziness, syncope, speech difficulty, weakness, numbness and headaches.  Hematological: Negative for adenopathy.  Psychiatric/Behavioral: Positive for behavioral problems and dysphoric mood. Negative for agitation. The patient is nervous/anxious.        Objective:   Physical Exam  Constitutional: She appears well-developed and well-nourished. No distress.  Repeat blood pressure improved to 120 over 82  Psychiatric: Her behavior is normal. Judgment and thought content normal.  Appears mildly depressed          Assessment & Plan:  Major depression/anxiety improved.  Continue behavioral health follow-up Hypertension, stable Anxiety disorder

## 2015-06-01 ENCOUNTER — Ambulatory Visit (INDEPENDENT_AMBULATORY_CARE_PROVIDER_SITE_OTHER): Payer: No Typology Code available for payment source | Admitting: Psychology

## 2015-06-01 DIAGNOSIS — F341 Dysthymic disorder: Secondary | ICD-10-CM

## 2015-06-16 ENCOUNTER — Ambulatory Visit (INDEPENDENT_AMBULATORY_CARE_PROVIDER_SITE_OTHER): Payer: No Typology Code available for payment source | Admitting: Psychology

## 2015-06-16 DIAGNOSIS — F341 Dysthymic disorder: Secondary | ICD-10-CM | POA: Diagnosis not present

## 2015-06-21 ENCOUNTER — Ambulatory Visit (INDEPENDENT_AMBULATORY_CARE_PROVIDER_SITE_OTHER): Payer: No Typology Code available for payment source | Admitting: Psychology

## 2015-06-26 ENCOUNTER — Telehealth: Payer: Self-pay | Admitting: *Deleted

## 2015-06-26 NOTE — Telephone Encounter (Signed)
Spoke to pt, told her Dr.K said to use both Advil and Tylenol and if unimproved need office visit later this week. Pt verbalized understanding.

## 2015-06-26 NOTE — Telephone Encounter (Signed)
Pt called and left message on my voicemail, c/o pain in back and all her joints would like something for pain. Has been in bed. Please advise.

## 2015-06-26 NOTE — Telephone Encounter (Signed)
Okay to use both Advil and Tylenol.  Office visit later this week.  If unimproved

## 2015-07-02 ENCOUNTER — Encounter: Payer: Self-pay | Admitting: Internal Medicine

## 2015-07-03 MED ORDER — SERTRALINE HCL 100 MG PO TABS: 100.0000 mg | ORAL_TABLET | Freq: Every day | ORAL | Status: DC

## 2015-07-03 MED ORDER — TELMISARTAN-HCTZ 80-12.5 MG PO TABS: 1.0000 | ORAL_TABLET | Freq: Every day | ORAL | Status: DC

## 2015-07-03 NOTE — Telephone Encounter (Signed)
Left message on voicemail to call office on home and mobile.  

## 2015-07-05 ENCOUNTER — Ambulatory Visit: Payer: No Typology Code available for payment source | Admitting: Psychology

## 2015-07-05 MED ORDER — OMEPRAZOLE 40 MG PO CPDR: 40.0000 mg | DELAYED_RELEASE_CAPSULE | Freq: Every day | ORAL | Status: DC

## 2015-07-05 NOTE — Telephone Encounter (Signed)
Left message on voicemail to call office.  

## 2015-07-26 ENCOUNTER — Telehealth: Payer: Self-pay | Admitting: Internal Medicine

## 2015-07-26 NOTE — Telephone Encounter (Signed)
Dr. Clent Ridges approved for pharmacy to release refill today 07/26/15. Pharmacy called. Patient also called and made aware.

## 2015-07-26 NOTE — Telephone Encounter (Signed)
[facility]ill on her clonazepam that is not due until 07/30/15. [facility] her because patient has been having withdrawal sx since Friday. Shaking, sweats etc. Patiients husband passed away in 2023-03-22 and neighbor says that Julie Rice has admitted to taking more medication than prescribed.

## 2015-08-30 ENCOUNTER — Other Ambulatory Visit: Payer: Self-pay | Admitting: Internal Medicine

## 2015-10-22 ENCOUNTER — Other Ambulatory Visit: Payer: Self-pay | Admitting: Internal Medicine

## 2015-11-19 ENCOUNTER — Other Ambulatory Visit: Payer: Self-pay | Admitting: Internal Medicine

## 2015-11-30 ENCOUNTER — Other Ambulatory Visit: Payer: Self-pay | Admitting: Internal Medicine

## 2015-12-08 ENCOUNTER — Ambulatory Visit (INDEPENDENT_AMBULATORY_CARE_PROVIDER_SITE_OTHER): Payer: Medicare Other | Admitting: Internal Medicine

## 2015-12-08 ENCOUNTER — Encounter: Payer: Self-pay | Admitting: Internal Medicine

## 2015-12-08 VITALS — BP 132/82 | Temp 98.5°F | Ht 62.0 in | Wt 164.0 lb

## 2015-12-08 DIAGNOSIS — E039 Hypothyroidism, unspecified: Secondary | ICD-10-CM | POA: Diagnosis not present

## 2015-12-08 DIAGNOSIS — M159 Polyosteoarthritis, unspecified: Secondary | ICD-10-CM

## 2015-12-08 DIAGNOSIS — M150 Primary generalized (osteo)arthritis: Secondary | ICD-10-CM | POA: Diagnosis not present

## 2015-12-08 DIAGNOSIS — I10 Essential (primary) hypertension: Secondary | ICD-10-CM | POA: Diagnosis not present

## 2015-12-08 DIAGNOSIS — R5383 Other fatigue: Secondary | ICD-10-CM | POA: Diagnosis not present

## 2015-12-08 DIAGNOSIS — R350 Frequency of micturition: Secondary | ICD-10-CM

## 2015-12-08 DIAGNOSIS — M8949 Other hypertrophic osteoarthropathy, multiple sites: Secondary | ICD-10-CM

## 2015-12-08 LAB — CBC WITH DIFFERENTIAL/PLATELET
Basophils Absolute: 0.1 10*3/uL (ref 0.0–0.1)
Basophils Relative: 0.9 % (ref 0.0–3.0)
EOS PCT: 2.8 % (ref 0.0–5.0)
Eosinophils Absolute: 0.2 10*3/uL (ref 0.0–0.7)
HCT: 36.3 % (ref 36.0–46.0)
Hemoglobin: 11.8 g/dL — ABNORMAL LOW (ref 12.0–15.0)
LYMPHS ABS: 2 10*3/uL (ref 0.7–4.0)
Lymphocytes Relative: 25.5 % (ref 12.0–46.0)
MCHC: 32.3 g/dL (ref 30.0–36.0)
MCV: 85.8 fl (ref 78.0–100.0)
MONO ABS: 0.6 10*3/uL (ref 0.1–1.0)
Monocytes Relative: 7.7 % (ref 3.0–12.0)
NEUTROS PCT: 63.1 % (ref 43.0–77.0)
Neutro Abs: 4.9 10*3/uL (ref 1.4–7.7)
PLATELETS: 286 10*3/uL (ref 150.0–400.0)
RBC: 4.24 Mil/uL (ref 3.87–5.11)
RDW: 14.4 % (ref 11.5–15.5)
WBC: 7.7 10*3/uL (ref 4.0–10.5)

## 2015-12-08 LAB — COMPREHENSIVE METABOLIC PANEL
ALK PHOS: 83 U/L (ref 39–117)
ALT: 10 U/L (ref 0–35)
AST: 17 U/L (ref 0–37)
Albumin: 4.3 g/dL (ref 3.5–5.2)
BUN: 26 mg/dL — AB (ref 6–23)
CHLORIDE: 101 meq/L (ref 96–112)
CO2: 32 mEq/L (ref 19–32)
CREATININE: 1.06 mg/dL (ref 0.40–1.20)
Calcium: 9.5 mg/dL (ref 8.4–10.5)
GFR: 53.3 mL/min — ABNORMAL LOW (ref >60.00)
Glucose, Bld: 94 mg/dL (ref 70–99)
Potassium: 4.5 mEq/L (ref 3.5–5.1)
SODIUM: 140 meq/L (ref 135–145)
TOTAL PROTEIN: 7.3 g/dL (ref 6.0–8.3)
Total Bilirubin: 0.4 mg/dL (ref 0.2–1.2)

## 2015-12-08 LAB — POCT URINALYSIS DIPSTICK
Bilirubin, UA: NEGATIVE
Blood, UA: NEGATIVE
GLUCOSE UA: NEGATIVE
Ketones, UA: NEGATIVE
NITRITE UA: NEGATIVE
Protein, UA: NEGATIVE
Spec Grav, UA: 1.01
Urobilinogen, UA: 0.2
pH, UA: 5.5

## 2015-12-08 LAB — TSH: TSH: 1.61 u[IU]/mL (ref 0.35–4.50)

## 2015-12-08 LAB — SEDIMENTATION RATE: Sed Rate: 23 mm/hr — ABNORMAL HIGH (ref 0–22)

## 2015-12-08 NOTE — Progress Notes (Signed)
Subjective:    Patient ID: Julie Rice, female    DOB: Sep 22, 1936, 80 y.o.   MRN: [id number]  HPI  Wt Readings from Last 3 Encounters:  12/08/15 164 lb (74.39 kg)  05/23/15 167 lb (75.751 kg)  04/11/15 165 lb (74.70 kg)   80 year old patient who is seen today for her biannual follow-up.  She has essential hypertension.  She has history of anxiety, depression, does complain of some increasing fatigue.  She has been on Klonopin as well as sertraline. Laboratory studies from last summer reviewed and did reveal some mild anemia. She has osteoarthritis.  Remains on daily Mobic with omeprazole cyto-protection. No GI complaints Other complaints include the low back pain as well as shoulder pain  Past Medical History  Diagnosis Date  . CAD (coronary artery disease)   . Hyperlipidemia   . TIA (transient ischemic attack)   . Hypertension   . COPD (chronic obstructive pulmonary disease) ([facility])   . Depression   . Anemia   . Arthritis   . Lumbago   . Fecal occult blood test positive   . Rectal bleeding   . Internal hemorrhoids   . Esophageal stricture   . Hiatal hernia   . GERD (gastroesophageal reflux disease)   . Anxiety   . Spondylosis   . Mitral regurgitation   . PONV (postoperative nausea and vomiting)   . Chest pain     "@ rest, lying down, w/exertion"  . Dyspnea   . SOB (shortness of breath)     "@ rest, lying down, w/exertion"  . Blood transfusion   . Fibromyalgia   . Bipolar disorder [facility])     Social History   Social History  . Marital Status: Married    Spouse Name: N/A  . Number of Children: 2 D  . Years of Education: N/A   Occupational History  .     Social History Main Topics  . Smoking status: Never Smoker   . Smokeless tobacco: Never Used  . Alcohol Use: No  . Drug Use: No  . Sexual Activity: Not Currently   Other Topics Concern  . Not on file   Social History Narrative    Past Surgical History  Procedure Laterality Date  . Total knee  arthroplasty Right ~ 1996  . Lumbar laminectomy  1990; 1994; [id number]    "I've got 2 stainless steel rods; 6 screws; 2 ray cages"took bone from right hip to put in back  . Tonsillectomy and adenoidectomy  1945  . Abdominal hysterectomy  1982  . Appendectomy      pt is unsure if she's had appy  . Tubal ligation  ~ 1976  . Dilation and curettage of uterus  1961  . Cataract extraction w/ intraocular lens  implant, bilateral Bilateral 2010  . Bladder suspension  1980's    Family History  Problem Relation Age of Onset  . Kidney disease Mother   . Heart disease Father 43    MI  . Suicidality Son   . Hypertension Mother     Allergies  Allergen Reactions  . Metaxalone   . Morphine     Current Outpatient Prescriptions on File Prior to Visit  Medication Sig Dispense Refill  . aspirin 81 MG tablet Take 1 tablet (81 mg total) by mouth daily. For cardiovascular health.    . clonazePAM (KLONOPIN) 0.5 MG tablet TAKE 1 TABLET BY MOUTH 3 TIMES A DAY AS NEEDED 270 tablet 1  . CRANBERRY PO Take  1 tablet by mouth daily.    . furosemide (LASIX) 40 MG tablet TAKE 1 TABLET BY MOUTH EVERY DAY 90 tablet 0  . meloxicam (MOBIC) 15 MG tablet Take 1 tablet (15 mg total) by mouth daily. 90 tablet 1  . Multiple Vitamin (MULTIVITAMIN) tablet Take 1 tablet by mouth daily. Vitamin supplement.    . omeprazole (PRILOSEC) 40 MG capsule Take 1 capsule (40 mg total) by mouth daily. 90 capsule 1  . sertraline (ZOLOFT) 100 MG tablet TAKE 1 TABLET (100 MG TOTAL) BY MOUTH DAILY. 90 tablet 1  . telmisartan-hydrochlorothiazide (MICARDIS HCT) 80-12.5 MG per tablet Take 1 tablet by mouth daily. 90 tablet 1  . valsartan-hydrochlorothiazide (DIOVAN-HCT) 160-12.5 MG per tablet TAKE 1 TABLET BY MOUTH EVERY DAY 90 tablet 1   No current facility-administered medications on file prior to visit.    BP 132/82 mmHg  Temp(Src) 98.5 F (36.9 C) (Oral)  Ht 5' 2" (1.575 m)  Wt 164 lb (74.39 kg)  BMI 29.99  kg/m2      Review of Systems  Constitutional: Positive for activity change, appetite change and fatigue.  HENT: Negative for congestion, dental problem, hearing loss, rhinorrhea, sinus pressure, sore throat and tinnitus.   Eyes: Negative for pain, discharge and visual disturbance.  Respiratory: Negative for cough and shortness of breath.   Cardiovascular: Negative for chest pain, palpitations and leg swelling.  Gastrointestinal: Negative for nausea, vomiting, abdominal pain, diarrhea, constipation, blood in stool and abdominal distention.  Genitourinary: Negative for dysuria, urgency, frequency, hematuria, flank pain, vaginal bleeding, vaginal discharge, difficulty urinating, vaginal pain and pelvic pain.  Musculoskeletal: Positive for arthralgias. Negative for joint swelling and gait problem.  Skin: Negative for rash.  Neurological: Negative for dizziness, syncope, speech difficulty, weakness, numbness and headaches.  Hematological: Negative for adenopathy.  Psychiatric/Behavioral: Positive for dysphoric mood. Negative for behavioral problems and agitation. The patient is not nervous/anxious.        Objective:   Physical Exam  Constitutional: She is oriented to person, place, and time. She appears well-developed and well-nourished.  HENT:  Head: Normocephalic.  Right Ear: External ear normal.  Left Ear: External ear normal.  Mouth/Throat: Oropharynx is clear and moist.  Eyes: Conjunctivae and EOM are normal. Pupils are equal, round, and reactive to light.  Neck: Normal range of motion. Neck supple. No thyromegaly present.  Cardiovascular: Normal rate, regular rhythm, normal heart sounds and intact distal pulses.   Pulmonary/Chest: Effort normal and breath sounds normal.  Abdominal: Soft. Bowel sounds are normal. She exhibits no mass. There is no tenderness.  Musculoskeletal: Normal range of motion.  Lymphadenopathy:    She has no cervical adenopathy.  Neurological: She is alert  and oriented to person, place, and time.  Skin: Skin is warm and dry. No rash noted.  Psychiatric: She has a normal mood and affect. Her behavior is normal.          Assessment & Plan:   Hypertension, well-controlled.  Repeat blood pressure 136/80 Osteoarthritis History of anemia.  Check CBC Fatigue, arthralgias.  Modest weight loss.  Check ESR Depression.  Continue sertraline  CPX 6 months 

## 2015-12-08 NOTE — Patient Instructions (Signed)
Limit your sodium (Salt) intake  Please check your blood pressure on a regular basis.  If it is consistently greater than 150/90, please make an office appointment.    It is important that you exercise regularly, at least 20 minutes 3 to 4 times per week.  If you develop chest pain or shortness of breath seek  medical attention.  Return in 6 months for follow-up  

## 2015-12-25 ENCOUNTER — Encounter: Payer: Self-pay | Admitting: Internal Medicine

## 2015-12-25 NOTE — Telephone Encounter (Signed)
Please see message and advise 

## 2016-01-26 ENCOUNTER — Ambulatory Visit (INDEPENDENT_AMBULATORY_CARE_PROVIDER_SITE_OTHER): Payer: Medicare Other | Admitting: Adult Health

## 2016-01-26 ENCOUNTER — Encounter: Payer: Self-pay | Admitting: Adult Health

## 2016-01-26 ENCOUNTER — Other Ambulatory Visit: Payer: Self-pay | Admitting: Adult Health

## 2016-01-26 ENCOUNTER — Ambulatory Visit (INDEPENDENT_AMBULATORY_CARE_PROVIDER_SITE_OTHER)
Admission: RE | Admit: 2016-01-26 | Discharge: 2016-01-26 | Disposition: A | Discharge status: Home / Self Care | Payer: Medicare Other | Source: Ambulatory Visit | Attending: Adult Health | Admitting: Adult Health

## 2016-01-26 VITALS — BP 160/82 | Temp 98.3°F | Ht 62.0 in | Wt 162.0 lb

## 2016-01-26 DIAGNOSIS — S299XXA Unspecified injury of thorax, initial encounter: Secondary | ICD-10-CM | POA: Diagnosis not present

## 2016-01-26 DIAGNOSIS — M546 Pain in thoracic spine: Secondary | ICD-10-CM | POA: Diagnosis not present

## 2016-01-26 DIAGNOSIS — S199XXA Unspecified injury of neck, initial encounter: Secondary | ICD-10-CM | POA: Diagnosis not present

## 2016-01-26 DIAGNOSIS — M549 Dorsalgia, unspecified: Secondary | ICD-10-CM

## 2016-01-26 DIAGNOSIS — M545 Low back pain: Secondary | ICD-10-CM | POA: Diagnosis not present

## 2016-01-26 DIAGNOSIS — S3992XA Unspecified injury of lower back, initial encounter: Secondary | ICD-10-CM | POA: Diagnosis not present

## 2016-01-26 NOTE — Patient Instructions (Addendum)
It was great meeting you today  I have placed orders for x rays of your back. Please go to the [facility] office to have the x rays done. I will follow up with you regarding them.   Continue to use your pain medication as needed  Check your blood pressure at home and follow up with Dr. Kirtland Bouchard if it continues to be high

## 2016-01-26 NOTE — Progress Notes (Signed)
Subjective:    Patient ID: Julie Rice, female    DOB: June 12, 1936, 80 y.o.   MRN: [id number]  HPI 80 year old female, patient of Dr. Charm Rings, who I'm seeing today for an acute issue. She reports that for nights ago she was sitting in her lazy boy chair, when she changed positions the chair tipped backwards and she hit her head on a carpeted floor as well as her entire back. He's had multiple back surgeries with pretty extensive hardware and she reports that her entire back hurts much more than what she is used to. She is concerned that some of the hardware might have broken her become loose.  She denies any 3 to her occiput, and did not lose consciousness. He has not had any blurred vision, dizziness, or lightheaded feeling since the fall. She does have full range of motion of her neck in all extremities  She's been taking her prescription pain medication as well as using a heating pad   Review of Systems  Constitutional: Positive for activity change.  Respiratory: Negative.   Cardiovascular: Negative.   Musculoskeletal: Positive for back pain, arthralgias and neck pain. Negative for joint swelling, gait problem and neck stiffness.  Neurological: Negative.    Past Medical History  Diagnosis Date  . CAD (coronary artery disease)   . Hyperlipidemia   . TIA (transient ischemic attack)   . Hypertension   . COPD (chronic obstructive pulmonary disease) ([facility])   . Depression   . Anemia   . Arthritis   . Lumbago   . Fecal occult blood test positive   . Rectal bleeding   . Internal hemorrhoids   . Esophageal stricture   . Hiatal hernia   . GERD (gastroesophageal reflux disease)   . Anxiety   . Spondylosis   . Mitral regurgitation   . PONV (postoperative nausea and vomiting)   . Chest pain     "@ rest, lying down, w/exertion"  . Dyspnea   . SOB (shortness of breath)     "@ rest, lying down, w/exertion"  . Blood transfusion   . Fibromyalgia   . Bipolar disorder [facility])      Social History   Social History  . Marital Status: Married    Spouse Name: N/A  . Number of Children: 2 D  . Years of Education: N/A   Occupational History  .     Social History Main Topics  . Smoking status: Never Smoker   . Smokeless tobacco: Never Used  . Alcohol Use: No  . Drug Use: No  . Sexual Activity: Not Currently   Other Topics Concern  . Not on file   Social History Narrative    Past Surgical History  Procedure Laterality Date  . Total knee arthroplasty Right ~ 1996  . Lumbar laminectomy  1990; 1994; [id number]    "I've got 2 stainless steel rods; 6 screws; 2 ray cages"took bone from right hip to put in back  . Tonsillectomy and adenoidectomy  1945  . Abdominal hysterectomy  1982  . Appendectomy      pt is unsure if she's had appy  . Tubal ligation  ~ 1976  . Dilation and curettage of uterus  1961  . Cataract extraction w/ intraocular lens  implant, bilateral Bilateral 2010  . Bladder suspension  1980's    Family History  Problem Relation Age of Onset  . Kidney disease Mother   . Heart disease Father 61    MI  .  Suicidality Son   . Hypertension Mother     Allergies  Allergen Reactions  . Metaxalone   . Morphine     Current Outpatient Prescriptions on File Prior to Visit  Medication Sig Dispense Refill  . aspirin 81 MG tablet Take 1 tablet (81 mg total) by mouth daily. For cardiovascular health.    . clonazePAM (KLONOPIN) 0.5 MG tablet TAKE 1 TABLET BY MOUTH 3 TIMES A DAY AS NEEDED 270 tablet 1  . CRANBERRY PO Take 1 tablet by mouth daily.    . furosemide (LASIX) 40 MG tablet TAKE 1 TABLET BY MOUTH EVERY DAY 90 tablet 0  . meloxicam (MOBIC) 15 MG tablet Take 1 tablet (15 mg total) by mouth daily. 90 tablet 1  . Multiple Vitamin (MULTIVITAMIN) tablet Take 1 tablet by mouth daily. Vitamin supplement.    Marland Kitchen omeprazole (PRILOSEC) 40 MG capsule Take 1 capsule (40 mg total) by mouth daily. 90 capsule 1  . sertraline (ZOLOFT) 100 MG tablet TAKE 1  TABLET (100 MG TOTAL) BY MOUTH DAILY. 90 tablet 1  . telmisartan-hydrochlorothiazide (MICARDIS HCT) 80-12.5 MG per tablet Take 1 tablet by mouth daily. 90 tablet 1  . valsartan-hydrochlorothiazide (DIOVAN-HCT) 160-12.5 MG per tablet TAKE 1 TABLET BY MOUTH EVERY DAY 90 tablet 1   No current facility-administered medications on file prior to visit.    BP 160/82 mmHg  Temp(Src) 98.3 F (36.8 C) (Oral)  Ht 5\' 2"  (1.575 m)  Wt 162 lb (73.483 kg)  BMI 29.62 kg/m2       Objective:   Physical Exam  Constitutional: She is oriented to person, place, and time. She appears well-developed and well-nourished. No distress.  HENT:  Head: Normocephalic and atraumatic.  Right Ear: External ear normal.  Left Ear: External ear normal.  Nose: Nose normal.  Mouth/Throat: Oropharynx is clear and moist. No oropharyngeal exudate.  No trauma noted to back of head  Neck: Normal range of motion. Neck supple.  Cardiovascular: Normal rate, regular rhythm, normal heart sounds and intact distal pulses.  Exam reveals no gallop and no friction rub.   No murmur heard. Pulmonary/Chest: Effort normal and breath sounds normal. No respiratory distress. She has no wheezes. She has no rales. She exhibits no tenderness.  Musculoskeletal: Normal range of motion. She exhibits no edema.       Cervical back: She exhibits tenderness, bony tenderness and pain. She exhibits no swelling, no edema and no deformity.       Thoracic back: She exhibits tenderness, bony tenderness and pain. She exhibits no swelling, no edema and no deformity.       Lumbar back: She exhibits tenderness, bony tenderness and pain. She exhibits no swelling, no edema and no deformity.  Lymphadenopathy:    She has no cervical adenopathy.  Neurological: She is alert and oriented to person, place, and time.  Skin: Skin is warm and dry. No rash noted. She is not diaphoretic. No erythema. No pallor.  Psychiatric: She has a normal mood and affect. Her behavior  is normal. Judgment and thought content normal.  Nursing note and vitals reviewed.     Assessment & Plan:  1. Back pain, unspecified location - Does not appear to be any deformity to her spine. Step off noted. She does have spinal tenderness from cervical to lower lumbar spine. Most of this may be chronic. - DG Lumbar Spine Complete; Future - DG Cervical Spine Complete; Future - Continue with prescription Mobic and heating bad   Additionally her  blood pressure was high in the office today at 170/88. She saw Dr. Ed Blalock month and a half ago at which time her blood pressure was 132/82. Some of the rise in blood pressure may be contributed to pain. I advised the patient to monitor both her blood pressures twice a day for 1 week and follow-up with Dr. Kirtland Bouchard

## 2016-02-04 ENCOUNTER — Other Ambulatory Visit: Payer: Self-pay | Admitting: Internal Medicine

## 2016-02-22 ENCOUNTER — Ambulatory Visit (INDEPENDENT_AMBULATORY_CARE_PROVIDER_SITE_OTHER): Payer: Medicare Other | Admitting: Internal Medicine

## 2016-02-22 ENCOUNTER — Encounter: Payer: Self-pay | Admitting: Internal Medicine

## 2016-02-22 VITALS — Ht 62.0 in | Wt 164.0 lb

## 2016-02-22 DIAGNOSIS — K222 Esophageal obstruction: Secondary | ICD-10-CM | POA: Diagnosis not present

## 2016-02-22 DIAGNOSIS — R1310 Dysphagia, unspecified: Secondary | ICD-10-CM | POA: Diagnosis not present

## 2016-02-22 DIAGNOSIS — K219 Gastro-esophageal reflux disease without esophagitis: Secondary | ICD-10-CM | POA: Diagnosis not present
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
## 2016-02-22 NOTE — Patient Instructions (Signed)

## 2016-02-23 ENCOUNTER — Ambulatory Visit (AMBULATORY_SURGERY_CENTER): Payer: Medicare Other | Admitting: Internal Medicine

## 2016-02-23 ENCOUNTER — Encounter: Payer: Self-pay | Admitting: Internal Medicine

## 2016-02-23 VITALS — BP 176/89 | HR 66 | Temp 98.9°F | Resp 14 | Ht 62.0 in | Wt 164.0 lb

## 2016-02-23 DIAGNOSIS — K222 Esophageal obstruction: Secondary | ICD-10-CM | POA: Diagnosis not present

## 2016-02-23 DIAGNOSIS — R1310 Dysphagia, unspecified: Secondary | ICD-10-CM | POA: Diagnosis not present

## 2016-02-23 DIAGNOSIS — I10 Essential (primary) hypertension: Secondary | ICD-10-CM | POA: Diagnosis not present

## 2016-02-23 DIAGNOSIS — Z6830 Body mass index (BMI) 30.0-30.9, adult: Secondary | ICD-10-CM | POA: Diagnosis not present

## 2016-02-23 DIAGNOSIS — K219 Gastro-esophageal reflux disease without esophagitis: Secondary | ICD-10-CM

## 2016-02-23 MED ORDER — SODIUM CHLORIDE 0.9 % IV SOLN: 500.0000 mL | INTRAVENOUS | Status: DC

## 2016-02-23 NOTE — Op Note (Signed)
[facility] Patient Name: Julie Rice Procedure Date: 02/23/2016 2:27 PM MRN: [id number] Endoscopist: Wilhemina Bonito. [name] , MD Age: 80 Date of Birth: 01/14/1936 Gender: Female Procedure:                Upper GI endoscopy, with balloon dilation of the                            esophagus Indications:              Dysphagia Medicines:                Monitored Anesthesia Care Procedure:                Pre-Anesthesia Assessment:                           - Prior to the procedure, a History and Physical                            was performed, and patient medications and                            allergies were reviewed. The patient's tolerance of                            previous anesthesia was also reviewed. The risks                            and benefits of the procedure and the sedation                            options and risks were discussed with the patient.                            All questions were answered, and informed consent                            was obtained. Prior Anticoagulants: The patient has                            taken no previous anticoagulant or antiplatelet                            agents. ASA Grade Assessment: II - A patient with                            mild systemic disease. After reviewing the risks                            and benefits, the patient was deemed in                            satisfactory condition to undergo the procedure.  After obtaining informed consent, the endoscope was                            passed under direct vision. Throughout the                            procedure, the patient's blood pressure, pulse, and                            oxygen saturations were monitored continuously. The                            Model GIF-[id number]) scope was introduced                            through the mouth, and advanced to the second part                            of duodenum.  The upper GI endoscopy was                            accomplished without difficulty. The patient                            tolerated the procedure well. Scope In: Scope Out: Findings:                 One benign-appearing, intrinsic stenosis was found                            at the gastroesophageal junction. This measured                            approximately 15 mm in diameter. A TTS dilator was                            passed through the scope. Dilation with an 18-19-20                            mm balloon dilator was performed to 20 mm.                           A 5 cm hiatal hernia was present, and slightly                            angulated.                           The exam of the stomach was otherwise normal,                            including retroflexed view.                           The examined duodenum was normal. Complications:  No immediate complications. Estimated Blood Loss:     Estimated blood loss: none. Impression:               - Benign-appearing esophageal stenosis. Dilated.                           - 5 cm hiatal hernia.                           - Normal examined duodenum.                           - No specimens collected. Recommendation:           - Patient has a contact number available for                            emergencies. The signs and symptoms of potential                            delayed complications were discussed with the                            patient. Return to normal activities tomorrow.                            Written discharge instructions were provided to the                            patient.                           - Resume previous diet.                           - Continue present medications. Wilhemina Bonito. [name], MD 02/23/2016 2:58:39 PM This report has been signed electronically. CC Letter to:             Gordy Savers

## 2016-02-23 NOTE — Patient Instructions (Signed)
YOU HAD AN ENDOSCOPIC PROCEDURE TODAY AT THE [facility]:   Refer to the procedure report that was given to you for any specific questions about what was found during the examination.  If the procedure report does not answer your questions, please call your gastroenterologist to clarify.  If you requested that your care partner not be given the details of your procedure findings, then the procedure report has been included in a sealed envelope for you to review at your convenience later.  YOU SHOULD EXPECT: Some feelings of bloating in the abdomen. Passage of more gas than usual.  Walking can help get rid of the air that was put into your GI tract during the procedure and reduce the bloating. If you had a lower endoscopy (such as a colonoscopy or flexible sigmoidoscopy) you may notice spotting of blood in your stool or on the toilet paper. If you underwent a bowel prep for your procedure, you may not have a normal bowel movement for a few days.  Please Note:  You might notice some irritation and congestion in your nose or some drainage.  This is from the oxygen used during your procedure.  There is no need for concern and it should clear up in a day or so.  SYMPTOMS TO REPORT IMMEDIATELY:   Following upper endoscopy (EGD)  Vomiting of blood or coffee ground material  New chest pain or pain under the shoulder blades  Painful or persistently difficult swallowing  New shortness of breath  Fever of 100F or higher  Black, tarry-looking stools  For urgent or emergent issues, a gastroenterologist can be reached at any hour by calling [phone].   DIET: Your first meal following the procedure should be a small meal and then it is ok to progress to your normal diet. Heavy or fried foods are harder to digest and may make you feel nauseous or bloated.  Likewise, meals heavy in dairy and vegetables can increase bloating.  Drink plenty of fluids but you should avoid alcoholic beverages for  24 hours.  ACTIVITY:  You should plan to take it easy for the rest of today and you should NOT DRIVE or use heavy machinery until tomorrow (because of the sedation medicines used during the test).    FOLLOW UP: Our staff will call the number listed on your records the next business day following your procedure to check on you and address any questions or concerns that you may have regarding the information given to you following your procedure. If we do not reach you, we will leave a message.  However, if you are feeling well and you are not experiencing any problems, there is no need to return our call.  We will assume that you have returned to your regular daily activities without incident.  If any biopsies were taken you will be contacted by phone or by letter within the next 1-3 weeks.  Please call us at [phone] if you have not heard about the biopsies in 3 weeks.    SIGNATURES/CONFIDENTIALITY: You and/or your care partner have signed paperwork which will be entered into your electronic medical record.  These signatures attest to the fact that that the information above on your After Visit Summary has been reviewed and is understood.  Full responsibility of the confidentiality of this discharge information lies with you and/or your care-partner.  Dilation diet instructions given, handout provided. Stricture and hiatal hernia handouts provided.

## 2016-02-23 NOTE — Progress Notes (Signed)
Called to room to assist during endoscopic procedure.  Patient ID and intended procedure confirmed with present staff. Received instructions for my participation in the procedure from the performing physician.  

## 2016-02-26 ENCOUNTER — Telehealth: Payer: Self-pay | Admitting: *Deleted

## 2016-02-26 NOTE — Telephone Encounter (Signed)
  Follow up Call-  Call back number 02/23/2016  Post procedure Call Back phone  # [phone]  Permission to leave phone message Yes     Patient questions:  Do you have a fever, pain , or abdominal swelling? No. Pain Score  0 *  Have you tolerated food without any problems? Yes.    Have you been able to return to your normal activities? Yes.    Do you have any questions about your discharge instructions: Diet   No. Medications  No. Follow up visit  No.  Do you have questions or concerns about your Care? No.  Actions: * If pain score is 4 or above: No action needed, pain <4. Pt. States that she has had a slight sore throat. Had some difficulty swallowing Friday post procedure.  No fever.  Slight hoarsness.  Encouraged to call office if she continues to have swallowing issues.

## 2016-03-19 ENCOUNTER — Ambulatory Visit (INDEPENDENT_AMBULATORY_CARE_PROVIDER_SITE_OTHER): Payer: Medicare Other | Admitting: Internal Medicine

## 2016-03-19 ENCOUNTER — Encounter: Payer: Self-pay | Admitting: Internal Medicine

## 2016-03-19 VITALS — BP 150/86 | HR 76 | Temp 98.2°F | Resp 18 | Ht 62.0 in | Wt 160.0 lb

## 2016-03-19 DIAGNOSIS — I10 Essential (primary) hypertension: Secondary | ICD-10-CM | POA: Diagnosis not present

## 2016-03-19 DIAGNOSIS — E039 Hypothyroidism, unspecified: Secondary | ICD-10-CM | POA: Diagnosis not present

## 2016-03-19 DIAGNOSIS — F411 Generalized anxiety disorder: Secondary | ICD-10-CM

## 2016-03-19 DIAGNOSIS — D649 Anemia, unspecified: Secondary | ICD-10-CM

## 2016-03-19 MED ORDER — GABAPENTIN 100 MG PO CAPS: 100.0000 mg | ORAL_CAPSULE | Freq: Three times a day (TID) | ORAL | Status: DC

## 2016-03-19 NOTE — Patient Instructions (Signed)
Gabapentin 100 mg at bedtime for 3 days, then 100 mg twice daily for one week, then 100 mg 3 times daily  Limit your sodium (Salt) intake  Please check your blood pressure on a regular basis.  If it is consistently greater than 150/90, please make an office appointment.  Return in 3 months for follow-up

## 2016-03-19 NOTE — Progress Notes (Signed)
Subjective:    Patient ID: Julie Rice, female    DOB: 1935-12-14, 80 y.o.   MRN: [id number]  HPI  80 year old patient who has a history of anxiety, depression.  Other problems include osteoarthritis, chronic low back pain.  She has been seen by GI recently and has had a esophageal stricture redilated.  She complains of generalized pain and also generalized stiffness.  These have been chronic complaints, but she has episodic flaring of worse symptoms. She has been followed periodically by behavioral health.  Past Medical History  Diagnosis Date  . CAD (coronary artery disease)   . Hyperlipidemia   . TIA (transient ischemic attack)   . Hypertension   . COPD (chronic obstructive pulmonary disease) (HCC)   . Depression   . Anemia   . Arthritis   . Lumbago   . Fecal occult blood test positive   . Rectal bleeding   . Internal hemorrhoids   . Esophageal stricture   . Hiatal hernia   . GERD (gastroesophageal reflux disease)   . Anxiety   . Spondylosis   . Mitral regurgitation   . PONV (postoperative nausea and vomiting)   . Chest pain     "@ rest, lying down, w/exertion"  . Dyspnea   . SOB (shortness of breath)     "@ rest, lying down, w/exertion"  . Blood transfusion   . Fibromyalgia   . Bipolar disorder [facility])      Social History   Social History  . Marital Status: Married    Spouse Name: N/A  . Number of Children: 2 D  . Years of Education: N/A   Occupational History  .     Social History Main Topics  . Smoking status: Never Smoker   . Smokeless tobacco: Never Used  . Alcohol Use: No  . Drug Use: No  . Sexual Activity: Not Currently   Other Topics Concern  . Not on file   Social History Narrative    Past Surgical History  Procedure Laterality Date  . Total knee arthroplasty Right ~ 1996  . Lumbar laminectomy  1990; 1994; [id number]    "I've got 2 stainless steel rods; 6 screws; 2 ray cages"took bone from right hip to put in back  . Tonsillectomy  and adenoidectomy  1945  . Abdominal hysterectomy  1982  . Appendectomy      pt is unsure if she's had appy  . Tubal ligation  ~ 1976  . Dilation and curettage of uterus  1961  . Cataract extraction w/ intraocular lens  implant, bilateral Bilateral 2010  . Bladder suspension  1980's    Family History  Problem Relation Age of Onset  . Kidney disease Mother   . Heart disease Father 14    MI  . Suicidality Son   . Hypertension Mother     Allergies  Allergen Reactions  . Metaxalone   . Morphine   . Tape Other (See Comments)    Band aides, adhesive tape Redness and pulls skin off    Current Outpatient Prescriptions on File Prior to Visit  Medication Sig Dispense Refill  . aspirin 81 MG tablet Take 1 tablet (81 mg total) by mouth daily. For cardiovascular health.    . clonazePAM (KLONOPIN) 0.5 MG tablet TAKE 1 TABLET BY MOUTH 3 TIMES A DAY AS NEEDED 270 tablet 1  . CRANBERRY PO Take 1 tablet by mouth daily. Reported on 02/23/2016    . furosemide (LASIX) 40 MG tablet TAKE  1 TABLET BY MOUTH EVERY DAY 90 tablet 0  . meloxicam (MOBIC) 15 MG tablet TAKE 1 TABLET (15 MG TOTAL) BY MOUTH DAILY. 90 tablet 1  . Multiple Vitamin (MULTIVITAMIN) tablet Take 1 tablet by mouth daily. Vitamin supplement.    Marland Kitchen omeprazole (PRILOSEC) 40 MG capsule Take 1 capsule (40 mg total) by mouth daily. 90 capsule 1  . sertraline (ZOLOFT) 100 MG tablet TAKE 1 TABLET (100 MG TOTAL) BY MOUTH DAILY. 90 tablet 1  . telmisartan-hydrochlorothiazide (MICARDIS HCT) 80-12.5 MG tablet TAKE 1 TABLET BY MOUTH DAILY. 90 tablet 1   No current facility-administered medications on file prior to visit.    BP 150/86 mmHg  Pulse 76  Temp(Src) 98.2 F (36.8 C) (Oral)  Resp 18  Ht 5\' 2"  (1.575 m)  Wt 160 lb (72.576 kg)  BMI 29.26 kg/m2     Review of Systems  Constitutional: Positive for activity change and appetite change.  HENT: Negative for congestion, dental problem, hearing loss, rhinorrhea, sinus pressure, sore  throat and tinnitus.   Eyes: Negative for pain, discharge and visual disturbance.  Respiratory: Negative for cough and shortness of breath.   Cardiovascular: Negative for chest pain, palpitations and leg swelling.  Gastrointestinal: Negative for nausea, vomiting, abdominal pain, diarrhea, constipation, blood in stool and abdominal distention.  Genitourinary: Negative for dysuria, urgency, frequency, hematuria, flank pain, vaginal bleeding, vaginal discharge, difficulty urinating, vaginal pain and pelvic pain.  Musculoskeletal: Positive for myalgias and arthralgias. Negative for joint swelling and gait problem.  Skin: Negative for rash.  Neurological: Positive for weakness. Negative for dizziness, syncope, speech difficulty, numbness and headaches.  Hematological: Negative for adenopathy.  Psychiatric/Behavioral: Positive for dysphoric mood. Negative for behavioral problems and agitation. The patient is nervous/anxious.        Objective:   Physical Exam  Constitutional: She is oriented to person, place, and time. She appears well-developed and well-nourished.  HENT:  Head: Normocephalic.  Right Ear: External ear normal.  Left Ear: External ear normal.  Mouth/Throat: Oropharynx is clear and moist.  Eyes: Conjunctivae and EOM are normal. Pupils are equal, round, and reactive to light.  Neck: Normal range of motion. Neck supple. No thyromegaly present.  Cardiovascular: Normal rate, regular rhythm, normal heart sounds and intact distal pulses.   Pulmonary/Chest: Effort normal and breath sounds normal.  Abdominal: Soft. Bowel sounds are normal. She exhibits no mass. There is no tenderness.  Musculoskeletal: Normal range of motion.  Osteoarthritic changes involving the small joints of the hands  Lymphadenopathy:    She has no cervical adenopathy.  Neurological: She is alert and oriented to person, place, and time.  Skin: Skin is warm and dry. No rash noted.  Psychiatric: She has a normal  mood and affect. Her behavior is normal.          Assessment & Plan:  Anxiety disorder with depressed mood.  Chronic pain.  Will give a trial of Neurontin Hypertension, controlled Hypothyroidism, stable Osteoarthritis History of esophageal stricture status post recent dilatation  Recheck 3 months

## 2016-03-19 NOTE — Progress Notes (Signed)
Pre visit review using our clinic review tool, if applicable. No additional management support is needed unless otherwise documented below in the visit note. 

## 2016-03-20 DIAGNOSIS — H353131 Nonexudative age-related macular degeneration, bilateral, early dry stage: Secondary | ICD-10-CM | POA: Diagnosis not present

## 2016-03-20 DIAGNOSIS — H40013 Open angle with borderline findings, low risk, bilateral: Secondary | ICD-10-CM | POA: Diagnosis not present

## 2016-03-20 DIAGNOSIS — H04123 Dry eye syndrome of bilateral lacrimal glands: Secondary | ICD-10-CM | POA: Diagnosis not present

## 2016-03-20 DIAGNOSIS — H5213 Myopia, bilateral: Secondary | ICD-10-CM | POA: Diagnosis not present

## 2016-03-20 DIAGNOSIS — H524 Presbyopia: Secondary | ICD-10-CM | POA: Diagnosis not present

## 2016-04-19 ENCOUNTER — Encounter: Payer: Self-pay | Admitting: Internal Medicine

## 2016-04-24 DIAGNOSIS — Z96651 Presence of right artificial knee joint: Secondary | ICD-10-CM | POA: Diagnosis not present

## 2016-04-24 DIAGNOSIS — M25561 Pain in right knee: Secondary | ICD-10-CM | POA: Diagnosis not present

## 2016-04-24 DIAGNOSIS — Z471 Aftercare following joint replacement surgery: Secondary | ICD-10-CM | POA: Diagnosis not present

## 2016-04-25 ENCOUNTER — Other Ambulatory Visit: Payer: Self-pay | Admitting: Internal Medicine

## 2016-04-26 NOTE — Telephone Encounter (Signed)
Last filled on 10/24/15 #270 +1, last OV 03/19/16. Ok to refill?

## 2016-04-26 NOTE — Telephone Encounter (Signed)
ok 

## 2016-04-28 ENCOUNTER — Other Ambulatory Visit: Payer: Self-pay | Admitting: Internal Medicine

## 2016-05-02 ENCOUNTER — Telehealth: Payer: Self-pay | Admitting: Internal Medicine

## 2016-05-03 NOTE — Telephone Encounter (Signed)
Pt states she is having problems with swallowing. States that when she lays down at night is seems worse. Pt had egd with dil done in April. Pt scheduled to see Doug Sou PA 05/10/16@1 :30pm. Pt aware of appt.

## 2016-05-04 ENCOUNTER — Other Ambulatory Visit: Payer: Self-pay | Admitting: Internal Medicine

## 2016-05-10 ENCOUNTER — Encounter: Payer: Self-pay | Admitting: Gastroenterology

## 2016-05-10 ENCOUNTER — Ambulatory Visit (INDEPENDENT_AMBULATORY_CARE_PROVIDER_SITE_OTHER): Payer: Medicare Other | Admitting: Gastroenterology

## 2016-05-10 VITALS — BP 154/78 | HR 74 | Ht 62.0 in | Wt 165.0 lb

## 2016-05-10 DIAGNOSIS — R1013 Epigastric pain: Secondary | ICD-10-CM | POA: Diagnosis not present

## 2016-05-10 DIAGNOSIS — K222 Esophageal obstruction: Secondary | ICD-10-CM | POA: Diagnosis not present

## 2016-05-10 DIAGNOSIS — R1310 Dysphagia, unspecified: Secondary | ICD-10-CM | POA: Diagnosis not present

## 2016-05-10 NOTE — Progress Notes (Signed)
     05/10/2016 Julie Rice [id number] 1935-12-11   History of Present Illness:  This is a pleasant 80 year old female who is known to Dr. [name] for treatment of her GERD. She has had issues with dysphagia and has undergone EGD with dilation on a few occasions in the past. Most recently she had EGD on 02/23/2016 at which time she had one benign-appearing intrinsic stenosis in the GE junction that was dilated with the balloon to 20 mm. Also had a 5 cm hiatal hernia. She returns to our office today with continued complaints of dysphagia. Says that her swallowing issues never completely resolved or even improved much after her procedure on this occasion. She remembers getting much more resolution with dilation in the past. She is on omeprazole 40 mg daily. She also indicates some epigastric abdominal pain intermittently that she describes as feeling like a knife is jabbing her. When she is able to move a certain way and sometimes belch it will relieve the discomfort.  Current Medications, Allergies, Past Medical History, Past Surgical History, Family History and Social History were reviewed in [employer] record.   Physical Exam: BP 154/78 mmHg  Pulse 74  Ht 5\' 2"  (1.575 m)  Wt 165 lb (74.844 kg)  BMI 30.17 kg/m2 General: Well developed white female in no acute distress Head: Normocephalic and atraumatic Eyes:  Sclerae anicteric, conjunctiva pink  Ears: Normal auditory acuity Lungs: Clear throughout to auscultation Heart: Regular rate and rhythm Abdomen: Soft, non-distended.  Normal bowel sounds.  Mild epigastric TTP without R/R/G. Musculoskeletal: Symmetrical with no gross deformities  Extremities: No edema  Neurological: Alert oriented x 4, grossly non-focal Psychological:  Alert and cooperative. Normal mood and affect  Assessment and Recommendations: -80 year old female with complaints of dysphagia. Just had EGD with dilation in April, which showed some  stenosis at the GE junction and a 5 cm hiatal hernia. Symptoms never really improved post procedure. We'll schedule for esophagram to further evaluate. -Epigastric abdominal pain:  Intermittent.  ? Secondary to hiatal hernia.  Will evaluate with esophagram as stated above.

## 2016-05-10 NOTE — Patient Instructions (Signed)
You have been scheduled for a Barium Esophogram at  [facility], [facility], go to admitting on Tuesday 05-14-2016 at 9:00 am. Please arrive at 8:45 am to your appointment for registration. Make certain not to have anything to eat or drink 3 hours prior to your test. If you need to reschedule for any reason, please contact radiology at [phone] to do so. You could eat a light breakfast before 6:00 am.  __________________________________________________________________ A barium swallow is an examination that concentrates on views of the esophagus. This tends to be a double contrast exam (barium and two liquids which, when combined, create a gas to distend the wall of the oesophagus) or single contrast (non-ionic iodine based). The study is usually tailored to your symptoms so a good history is essential. Attention is paid during the study to the form, structure and configuration of the esophagus, looking for functional disorders (such as aspiration, dysphagia, achalasia, motility and reflux) EXAMINATION You may be asked to change into a gown, depending on the type of swallow being performed. A radiologist and radiographer will perform the procedure. The radiologist will advise you of the type of contrast selected for your procedure and direct you during the exam. You will be asked to stand, sit or lie in several different positions and to hold a small amount of fluid in your mouth before being asked to swallow while the imaging is performed .In some instances you may be asked to swallow barium coated marshmallows to assess the motility of a solid food bolus. The exam can be recorded as a digital or video fluoroscopy procedure. POST PROCEDURE It will take 1-2 days for the barium to pass through your system. To facilitate this, it is important, unless otherwise directed, to increase your fluids for the next 24-48hrs and to resume your normal diet.  This test typically takes about 30 minutes to  perform. __________________________________________________________________________________

## 2016-05-11 NOTE — Progress Notes (Signed)
Reviewed and agree.

## 2016-05-14 ENCOUNTER — Ambulatory Visit (HOSPITAL_COMMUNITY)
Admission: RE | Admit: 2016-05-14 | Discharge: 2016-05-14 | Disposition: A | Discharge status: Home / Self Care | Payer: Medicare Other | Source: Ambulatory Visit | Attending: Gastroenterology | Admitting: Gastroenterology

## 2016-05-14 DIAGNOSIS — K449 Diaphragmatic hernia without obstruction or gangrene: Secondary | ICD-10-CM | POA: Diagnosis not present

## 2016-05-14 DIAGNOSIS — K222 Esophageal obstruction: Secondary | ICD-10-CM | POA: Diagnosis not present

## 2016-05-14 DIAGNOSIS — R1310 Dysphagia, unspecified: Secondary | ICD-10-CM | POA: Insufficient documentation

## 2016-05-17 ENCOUNTER — Telehealth: Payer: Self-pay | Admitting: Gastroenterology

## 2016-05-17 NOTE — Telephone Encounter (Signed)
Spoke with the patient. See the imaging results. She will see Dr Johna Sheriff 06/06/16 arriving at 10:45 am. Information given to the patient.

## 2016-05-18 ENCOUNTER — Other Ambulatory Visit: Payer: Self-pay | Admitting: Internal Medicine

## 2016-05-20 NOTE — Telephone Encounter (Signed)
Rx refill sent to pharmacy. 

## 2016-05-21 ENCOUNTER — Ambulatory Visit (INDEPENDENT_AMBULATORY_CARE_PROVIDER_SITE_OTHER): Payer: Medicare Other | Admitting: Internal Medicine

## 2016-05-21 ENCOUNTER — Encounter: Payer: Self-pay | Admitting: Internal Medicine

## 2016-05-21 VITALS — BP 120/80 | HR 74 | Temp 97.8°F | Ht 62.0 in | Wt 164.0 lb

## 2016-05-21 DIAGNOSIS — I10 Essential (primary) hypertension: Secondary | ICD-10-CM | POA: Diagnosis not present

## 2016-05-21 DIAGNOSIS — M159 Polyosteoarthritis, unspecified: Secondary | ICD-10-CM

## 2016-05-21 DIAGNOSIS — M549 Dorsalgia, unspecified: Secondary | ICD-10-CM

## 2016-05-21 DIAGNOSIS — G8929 Other chronic pain: Secondary | ICD-10-CM

## 2016-05-21 DIAGNOSIS — M150 Primary generalized (osteo)arthritis: Secondary | ICD-10-CM | POA: Diagnosis not present

## 2016-05-21 DIAGNOSIS — M8949 Other hypertrophic osteoarthropathy, multiple sites: Secondary | ICD-10-CM

## 2016-05-21 NOTE — Patient Instructions (Signed)
Limit your sodium (Salt) intake  Please check your blood pressure on a regular basis.  If it is consistently greater than 150/90, please make an office appointment.  Return in 4 months for follow-up  Avoids foods high in acid such as tomatoes citrus juices, and spicy foods.  Avoid eating within two hours of lying down or before exercising.  Do not overheat.  Try smaller more frequent meals.  If symptoms persist, elevate the head of her bed 12 inches while sleeping.

## 2016-05-21 NOTE — Progress Notes (Signed)
Subjective:    Patient ID: Julie Rice, female    DOB: Dec 24, 1935, 80 y.o.   MRN: [id number]  HPI 80 year old patient who is seen today for follow-up.  She has essential hypertension which has been well-controlled She has been seen by GI recently and referred to general surgery for consideration of surgery for a large hiatal hernia.  More recently she has had worsening chest pain, especially at night in spite of maximum medical treatment She has hypothyroidism and osteoarthritis. Her chronic back pain has been stable She also complains of numbness of her feet that has been fairly chronic.  She feels that Neurontin has been helpful  Past Medical History  Diagnosis Date  . CAD (coronary artery disease)   . Hyperlipidemia   . TIA (transient ischemic attack)   . Hypertension   . COPD (chronic obstructive pulmonary disease) (HCC)   . Depression   . Anemia   . Arthritis   . Lumbago   . Fecal occult blood test positive   . Rectal bleeding   . Internal hemorrhoids   . Esophageal stricture   . Hiatal hernia   . GERD (gastroesophageal reflux disease)   . Anxiety   . Spondylosis   . Mitral regurgitation   . PONV (postoperative nausea and vomiting)   . Chest pain     "@ rest, lying down, w/exertion"  . Dyspnea   . SOB (shortness of breath)     "@ rest, lying down, w/exertion"  . Blood transfusion   . Fibromyalgia   . Bipolar disorder [facility]      Social History   Social History  . Marital Status: Married    Spouse Name: N/A  . Number of Children: 2 D  . Years of Education: N/A   Occupational History  .     Social History Main Topics  . Smoking status: Never Smoker   . Smokeless tobacco: Never Used  . Alcohol Use: No  . Drug Use: No  . Sexual Activity: Not Currently   Other Topics Concern  . Not on file   Social History Narrative    Past Surgical History  Procedure Laterality Date  . Total knee arthroplasty Right ~ 1996  . Lumbar laminectomy  1990; 1994;  [id number]    "I've got 2 stainless steel rods; 6 screws; 2 ray cages"took bone from right hip to put in back  . Tonsillectomy and adenoidectomy  1945  . Abdominal hysterectomy  1982  . Appendectomy      pt is unsure if she's had appy  . Tubal ligation  ~ 1976  . Dilation and curettage of uterus  1961  . Cataract extraction w/ intraocular lens  implant, bilateral Bilateral 2010  . Bladder suspension  1980's    Family History  Problem Relation Age of Onset  . Kidney disease Mother   . Heart disease Father 44    MI  . Suicidality Son   . Hypertension Mother     Allergies  Allergen Reactions  . Metaxalone   . Morphine   . Tape Other (See Comments)    Band aides, adhesive tape Redness and pulls skin off    Current Outpatient Prescriptions on File Prior to Visit  Medication Sig Dispense Refill  . aspirin 81 MG tablet Take 1 tablet (81 mg total) by mouth daily. For cardiovascular health.    . clonazePAM (KLONOPIN) 0.5 MG tablet TAKE 1 TABLET THREE TIMES A DAY AS NEEDED 270 tablet 0  .  CRANBERRY PO Take 1 tablet by mouth daily. Reported on 02/23/2016    . furosemide (LASIX) 40 MG tablet TAKE 1 TABLET BY MOUTH EVERY DAY 90 tablet 1  . gabapentin (NEURONTIN) 100 MG capsule Take 1 capsule (100 mg total) by mouth 3 (three) times daily. 90 capsule 3  . meloxicam (MOBIC) 15 MG tablet TAKE 1 TABLET (15 MG TOTAL) BY MOUTH DAILY. 90 tablet 1  . Multiple Vitamin (MULTIVITAMIN) tablet Take 1 tablet by mouth daily. Vitamin supplement.    Marland Kitchen omeprazole (PRILOSEC) 40 MG capsule Take 1 capsule (40 mg total) by mouth daily. 90 capsule 1  . sertraline (ZOLOFT) 100 MG tablet TAKE 1 TABLET (100 MG TOTAL) BY MOUTH DAILY. 90 tablet 0  . telmisartan-hydrochlorothiazide (MICARDIS HCT) 80-12.5 MG tablet TAKE 1 TABLET BY MOUTH DAILY. 90 tablet 1   No current facility-administered medications on file prior to visit.    BP 120/80 mmHg  Pulse 74  Temp(Src) 97.8 F (36.6 C) (Oral)  Ht 5\' 2"  (1.575 m)  Wt  164 lb (74.39 kg)  BMI 29.99 kg/m2  SpO2 94%      Review of Systems  HENT: Negative for congestion, dental problem, hearing loss, rhinorrhea, sinus pressure, sore throat and tinnitus.   Eyes: Negative for pain, discharge and visual disturbance.  Respiratory: Negative for cough and shortness of breath.   Cardiovascular: Negative for chest pain, palpitations and leg swelling.  Gastrointestinal: Positive for abdominal pain. Negative for nausea, vomiting, diarrhea, constipation, blood in stool and abdominal distention.  Genitourinary: Negative for dysuria, urgency, frequency, hematuria, flank pain, vaginal bleeding, vaginal discharge, difficulty urinating, vaginal pain and pelvic pain.  Musculoskeletal: Positive for back pain and arthralgias. Negative for joint swelling and gait problem.  Skin: Negative for rash.  Neurological: Positive for weakness and numbness. Negative for dizziness, syncope, speech difficulty and headaches.  Hematological: Negative for adenopathy.  Psychiatric/Behavioral: Negative for behavioral problems, dysphoric mood and agitation. The patient is not nervous/anxious.        Objective:   Physical Exam  Constitutional: She is oriented to person, place, and time. She appears well-developed and well-nourished.  Anxious No distress Blood pressure well controlled  HENT:  Head: Normocephalic.  Right Ear: External ear normal.  Left Ear: External ear normal.  Mouth/Throat: Oropharynx is clear and moist.  Eyes: Conjunctivae and EOM are normal. Pupils are equal, round, and reactive to light.  Neck: Normal range of motion. Neck supple. No thyromegaly present.  Cardiovascular: Normal rate, regular rhythm and normal heart sounds.   Pulmonary/Chest: Effort normal and breath sounds normal.  Abdominal: Soft. Bowel sounds are normal. She exhibits no mass. There is no tenderness.  Musculoskeletal: Normal range of motion.  Lymphadenopathy:    She has no cervical adenopathy.    Neurological: She is alert and oriented to person, place, and time.  Skin: Skin is warm and dry. No rash noted.  Psychiatric: She has a normal mood and affect. Her behavior is normal.          Assessment & Plan:   Essential hypertension, well-controlled Osteoarthritis/chronic low back pain Symptomatic hiatal hernia.  For general surgical evaluation Anxiety disorder  Medications updated  Recheck here 4 months

## 2016-05-21 NOTE — Progress Notes (Signed)
Pre visit review using our clinic review tool, if applicable. No additional management support is needed unless otherwise documented below in the visit note. 

## 2016-06-06 ENCOUNTER — Other Ambulatory Visit: Payer: Self-pay | Admitting: General Surgery

## 2016-06-06 DIAGNOSIS — K449 Diaphragmatic hernia without obstruction or gangrene: Secondary | ICD-10-CM | POA: Diagnosis not present

## 2016-06-10 DIAGNOSIS — M40209 Unspecified kyphosis, site unspecified: Secondary | ICD-10-CM | POA: Diagnosis not present

## 2016-06-10 DIAGNOSIS — M545 Low back pain: Secondary | ICD-10-CM | POA: Diagnosis not present

## 2016-06-11 ENCOUNTER — Other Ambulatory Visit: Payer: Self-pay | Admitting: General Surgery

## 2016-06-11 DIAGNOSIS — R1013 Epigastric pain: Secondary | ICD-10-CM

## 2016-06-12 ENCOUNTER — Encounter: Payer: Self-pay | Admitting: Gastroenterology

## 2016-06-19 ENCOUNTER — Ambulatory Visit
Admission: RE | Admit: 2016-06-19 | Discharge: 2016-06-19 | Disposition: A | Discharge status: Home / Self Care | Payer: Medicare Other | Source: Ambulatory Visit | Attending: General Surgery | Admitting: General Surgery

## 2016-06-19 DIAGNOSIS — R1011 Right upper quadrant pain: Secondary | ICD-10-CM | POA: Diagnosis not present

## 2016-06-19 DIAGNOSIS — R1013 Epigastric pain: Secondary | ICD-10-CM

## 2016-07-02 ENCOUNTER — Other Ambulatory Visit: Payer: Self-pay | Admitting: Emergency Medicine

## 2016-07-02 MED ORDER — GABAPENTIN 100 MG PO CAPS
100.0000 mg | ORAL_CAPSULE | Freq: Three times a day (TID) | ORAL | 1 refills | Status: DC
Start: 2016-07-02 — End: 2016-12-28

## 2016-07-17 NOTE — Patient Instructions (Addendum)
Julie Rice  07/17/2016   Your procedure is scheduled on: 08-02-16  Report to [facility] Main  Entrance take [facility]  elevators to 3rd floor to  Short Stay Center at 530  AM.  Call this number if you have problems the morning of surgery [phone]   Remember: ONLY 1 PERSON MAY GO WITH YOU TO SHORT STAY TO GET  READY MORNING OF YOUR SURGERY.  Do not eat food or drink liquids :After Midnight.     Take these medicines the morning of surgery with A SIP OF WATER: clonazepam (klonopin) if needed, gabapentin (neurontin), omeprazole (prilosec),                               You may not have any metal on your body including hair pins and              piercings  Do not wear jewelry, make-up, lotions, powders or perfumes, deodorant             Do not wear nail polish.  Do not shave  48 hours prior to surgery.              Men may shave face and neck.   Do not bring valuables to the hospital. [facility] IS NOT             RESPONSIBLE   FOR VALUABLES.  Contacts, dentures or bridgework may not be worn into surgery.  Leave suitcase in the car. After surgery it may be brought to your room.                  Please read over the following fact sheets you were given: _____________________________________________________________________             [facility] - Preparing for Surgery Before surgery, you can play an important role.  Because skin is not sterile, your skin needs to be as free of germs as possible.  You can reduce the number of germs on your skin by washing with CHG (chlorahexidine gluconate) soap before surgery.  CHG is an antiseptic cleaner which kills germs and bonds with the skin to continue killing germs even after washing. Please DO NOT use if you have an allergy to CHG or antibacterial soaps.  If your skin becomes reddened/irritated stop using the CHG and inform your nurse when you arrive at Short Stay. Do not shave (including legs and underarms) for at  least 48 hours prior to the first CHG shower.  You may shave your face/neck. Please follow these instructions carefully:  1.  Shower with CHG Soap the night before surgery and the  morning of Surgery.  2.  If you choose to wash your hair, wash your hair first as usual with your  normal  shampoo.  3.  After you shampoo, rinse your hair and body thoroughly to remove the  shampoo.                           4.  Use CHG as you would any other liquid soap.  You can apply chg directly  to the skin and wash                       Gently with a scrungie or clean washcloth.  5.  Apply the CHG Soap to your body ONLY FROM THE NECK DOWN.   Do not use on face/ open                           Wound or open sores. Avoid contact with eyes, ears mouth and genitals (private parts).                       Wash face,  Genitals (private parts) with your normal soap.             6.  Wash thoroughly, paying special attention to the area where your surgery  will be performed.  7.  Thoroughly rinse your body with warm water from the neck down.  8.  DO NOT shower/wash with your normal soap after using and rinsing off  the CHG Soap.                9.  Pat yourself dry with a clean towel.            10.  Wear clean pajamas.            11.  Place clean sheets on your bed the night of your first shower and do not  sleep with pets. Day of Surgery : Do not apply any lotions/deodorants the morning of surgery.  Please wear clean clothes to the hospital/surgery center.  FAILURE TO FOLLOW THESE INSTRUCTIONS MAY RESULT IN THE CANCELLATION OF YOUR SURGERY PATIENT SIGNATURE_________________________________  NURSE SIGNATURE__________________________________  ________________________________________________________________________

## 2016-07-18 ENCOUNTER — Encounter (HOSPITAL_COMMUNITY): Payer: Self-pay

## 2016-07-18 ENCOUNTER — Encounter (HOSPITAL_COMMUNITY)
Admission: RE | Admit: 2016-07-18 | Discharge: 2016-07-18 | Disposition: A | Discharge status: Home / Self Care | Payer: Medicare Other | Source: Ambulatory Visit | Attending: General Surgery | Admitting: General Surgery

## 2016-07-18 DIAGNOSIS — K449 Diaphragmatic hernia without obstruction or gangrene: Secondary | ICD-10-CM | POA: Diagnosis not present

## 2016-07-18 DIAGNOSIS — Z01812 Encounter for preprocedural laboratory examination: Secondary | ICD-10-CM | POA: Diagnosis not present

## 2016-07-18 DIAGNOSIS — Z01810 Encounter for preprocedural cardiovascular examination: Secondary | ICD-10-CM | POA: Insufficient documentation

## 2016-07-18 HISTORY — DX: Unspecified urinary incontinence: R32

## 2016-07-18 LAB — BASIC METABOLIC PANEL
ANION GAP: 8 (ref 5–15)
BUN: 32 mg/dL — AB (ref 6–20)
CHLORIDE: 102 mmol/L (ref 101–111)
CO2: 32 mmol/L (ref 22–32)
Calcium: 9.2 mg/dL (ref 8.9–10.3)
Creatinine, Ser: 1.13 mg/dL — ABNORMAL HIGH (ref 0.44–1.00)
GFR calc Af Amer: 53 mL/min — ABNORMAL LOW (ref >60)
GFR, EST NON AFRICAN AMERICAN: 45 mL/min — AB (ref >60)
Glucose, Bld: 97 mg/dL (ref 65–99)
POTASSIUM: 4 mmol/L (ref 3.5–5.1)
SODIUM: 142 mmol/L (ref 135–145)

## 2016-07-18 LAB — CBC
HEMATOCRIT: 35.2 % — AB (ref 36.0–46.0)
HEMOGLOBIN: 11.1 g/dL — AB (ref 12.0–15.0)
MCH: 27.5 pg (ref 26.0–34.0)
MCHC: 31.5 g/dL (ref 30.0–36.0)
MCV: 87.1 fL (ref 78.0–100.0)
Platelets: 266 10*3/uL (ref 150–400)
RBC: 4.04 MIL/uL (ref 3.87–5.11)
RDW: 15.6 % — AB (ref 11.5–15.5)
WBC: 7.4 10*3/uL (ref 4.0–10.5)

## 2016-07-19 NOTE — Progress Notes (Signed)
bmet sent to dr Johna Sheriff inbasket by epic

## 2016-07-23 DIAGNOSIS — H40013 Open angle with borderline findings, low risk, bilateral: Secondary | ICD-10-CM | POA: Diagnosis not present

## 2016-07-23 DIAGNOSIS — H353131 Nonexudative age-related macular degeneration, bilateral, early dry stage: Secondary | ICD-10-CM | POA: Diagnosis not present

## 2016-07-23 DIAGNOSIS — H5213 Myopia, bilateral: Secondary | ICD-10-CM | POA: Diagnosis not present

## 2016-07-23 DIAGNOSIS — H02832 Dermatochalasis of right lower eyelid: Secondary | ICD-10-CM | POA: Diagnosis not present

## 2016-07-23 DIAGNOSIS — H02835 Dermatochalasis of left lower eyelid: Secondary | ICD-10-CM | POA: Diagnosis not present

## 2016-07-23 DIAGNOSIS — H52223 Regular astigmatism, bilateral: Secondary | ICD-10-CM | POA: Diagnosis not present

## 2016-07-23 DIAGNOSIS — H04123 Dry eye syndrome of bilateral lacrimal glands: Secondary | ICD-10-CM | POA: Diagnosis not present

## 2016-07-23 DIAGNOSIS — H524 Presbyopia: Secondary | ICD-10-CM | POA: Diagnosis not present

## 2016-07-23 DIAGNOSIS — H02834 Dermatochalasis of left upper eyelid: Secondary | ICD-10-CM | POA: Diagnosis not present

## 2016-07-23 DIAGNOSIS — H02831 Dermatochalasis of right upper eyelid: Secondary | ICD-10-CM | POA: Diagnosis not present

## 2016-07-29 ENCOUNTER — Encounter: Payer: Self-pay | Admitting: Internal Medicine

## 2016-08-01 NOTE — H&P (Signed)
History of Present Illness  The patient is a 80 year old female who presents with a hiatal hernia. She is referred by Dr. Yancey Flemings for asymptomatic and worsening hiatal hernia. The patient gives a long history of known hiatal hernia and reflux disease. She has been treated for acid reflux for a number of years. She has had recurrent dysphagia over quite a few years and has had several esophageal dilatations. More recently she has been having increasing symptoms. She recently underwent esophageal dilatation by Dr. [name]. This improved her dysphagia but a couple of weeks after that she did experience an episode of fairly severe chest pain at night which was relieved by getting up and walking around and time. She does have severe heartburn if she does not take her medications but it is fairly well controlled on medications. She has however having increasing dysphagia and increasing episodic chest pain. She states she pretty much now avoids any sort of solid meats or difficult to swallow foods and sticks to soft food. She still gets episodic chest pain often at night. She will occasionally have food sticking even after her dilatation with regurgitation. She reports she has previously had a cardiology evaluation for this chest pain about 3 years ago which was negative and felt to be secondary to her hiatal hernia.  She has an upper GI series. I have reviewed the study. This shows a large sliding type hiatal hernia with a redundant tortuous lower esophagus and the upper stomach herniated with inversion of the upper stomach. No esophageal stricture or narrowing noted and a barium tablet passed into the stomach.   Other Problems  Arthritis Back Pain Diverticulosis High blood pressure  Past Surgical History  Cataract Surgery Bilateral. Hysterectomy (not due to cancer) - Complete Knee Surgery Right. Tonsillectomy  Diagnostic Studies History  Colonoscopy 1-5 years ago Mammogram  1-3 years ago Pap Smear >5 years ago  Allergies  Morphine Sulfate (Concentrate) *ANALGESICS - OPIOID* Metaxalone *MUSCULOSKELETAL THERAPY AGENTS* Tape 1"X5yd *MEDICAL DEVICES AND SUPPLIES*  Medication History  ClonazePAM (0.5MG  Tablet, Oral) Active. Furosemide (40MG  Tablet, Oral) Active. Gabapentin (100MG  Capsule, Oral) Active. Meloxicam (15MG  Tablet, Oral) Active. Omeprazole (40MG  Capsule DR, Oral) Active. Sertraline HCl (100MG  Tablet, Oral) Active. Telmisartan-HCTZ (80-12.5MG  Tablet, Oral) Active. Medications Reconciled  Social History  Caffeine use Coffee, Tea. No drug use Tobacco use Never smoker.  Family History  Alcohol Abuse Son.  Pregnancy / Birth History  Age at menarche 15 years. Maternal age 63-20    Review of Systems  General Present- Fatigue. Not Present- Appetite Loss, Chills, Fever, Night Sweats, Weight Gain and Weight Loss. Skin Present- Dryness. Not Present- Change in Wart/Mole, Hives, Jaundice, New Lesions, Non-Healing Wounds, Rash and Ulcer. HEENT Present- Visual Disturbances and Wears glasses/contact lenses. Not Present- Earache, Hearing Loss, Hoarseness, Nose Bleed, Oral Ulcers, Ringing in the Ears, Seasonal Allergies, Sinus Pain, Sore Throat and Yellow Eyes. Cardiovascular Present- Chest Pain and Swelling of Extremities. Not Present- Difficulty Breathing Lying Down, Leg Cramps, Palpitations, Rapid Heart Rate and Shortness of Breath. Gastrointestinal Present- Indigestion. Not Present- Abdominal Pain, Bloating, Bloody Stool, Change in Bowel Habits, Chronic diarrhea, Constipation, Difficulty Swallowing, Excessive gas, Gets full quickly at meals, Hemorrhoids, Nausea, Rectal Pain and Vomiting. Female Genitourinary Present- Painful Urination. Not Present- Frequency, Nocturia, Pelvic Pain and Urgency. Musculoskeletal Present- Back Pain, Muscle Weakness and Swelling of Extremities. Not Present- Joint Pain, Joint Stiffness and Muscle  Pain. Neurological Present- Numbness and Weakness. Not Present- Decreased Memory, Fainting, Headaches, Seizures, Tingling, Tremor and  Trouble walking. Psychiatric Present- Anxiety. Not Present- Bipolar, Change in Sleep Pattern, Depression, Fearful and Frequent crying.  Vitals   Weight: 162 lb Height: 62in Body Surface Area: 1.75 m Body Mass Index: 29.63 kg/m  Temp.: 97.21F(Temporal)  Pulse: 75 (Regular)  BP: 128/80 (Sitting, Left Arm, Standard)       Physical Exam  The physical exam findings are as follows: Note:General: Alert, elderly Caucasian female, in no distress Skin: Warm and dry without rash or infection. HEENT: No palpable masses or thyromegaly. Sclera nonicteric. Pupils equal round and reactive. Oropharynx clear. Lymph nodes: No cervical, supraclavicular, or inguinal nodes palpable. Lungs: Breath sounds clear and equal. No wheezing or increased work of breathing. Cardiovascular: Regular rate and rhythm without murmer. 1+ ankle edema. Abdomen: Well healed incisions. Nondistended. Soft and nontender. No masses palpable. No organomegaly. No palpable hernias. Extremities:1 + ankle edema. Mild chronic venous stasis changes. Neurologic: Alert and fully oriented. Gait normal. No focal weakness. Psychiatric: Normal mood and affect. Thought content appropriate with normal judgement and insight    Assessment & Plan  LARGE HIATAL HERNIA (K44.9) Impression: Large hiatal hernia. She has a long history of reflux and dysphagia with dilatations. Recently increased dysphagia not responding well to dilatation which likely is secondary to her large hiatal hernia. She has significant GERD although reasonably well controlled on medications. She is having worsening episodic chest pain which appears related to her hiatal hernia with negative previous cardiac workup. Overall I think there is potential significant benefit from repair of her hiatal hernia with Nissen fundoplication.  I discussed the problem in detail with her and the nature of surgical procedure. We discussed risks of anesthetic complications, bleeding, infection, esophageal injury and long-term issues with dysphagia or gas bloat or recurrent symptoms. She would like to proceed with repair after our discussion. I would like to get a gallbladder ultrasound preoperatively to make sure this is not contributing to her pain. Current Plans GALLBLADDER US [phone]) Pt Education - Pamphlet Given - Gastroesophagel Reflux Disease: discussed with patient and provided information. Laparoscopic repair of large hiatal hernia with Nissen fundoplication

## 2016-08-02 ENCOUNTER — Ambulatory Visit (HOSPITAL_COMMUNITY): Payer: Medicare Other | Admitting: Anesthesiology

## 2016-08-02 ENCOUNTER — Encounter (HOSPITAL_COMMUNITY): Payer: Self-pay | Admitting: *Deleted

## 2016-08-02 ENCOUNTER — Inpatient Hospital Stay (HOSPITAL_COMMUNITY)
Admission: AD | Admit: 2016-08-02 | Discharge: 2016-08-06 | DRG: 327 | Disposition: A | Discharge status: Home / Self Care | Payer: Medicare Other | Source: Ambulatory Visit | Attending: General Surgery | Admitting: General Surgery

## 2016-08-02 ENCOUNTER — Encounter (HOSPITAL_COMMUNITY)
Admission: AD | Disposition: A | Discharge status: Home / Self Care | Payer: Self-pay | Source: Ambulatory Visit | Attending: General Surgery

## 2016-08-02 DIAGNOSIS — R112 Nausea with vomiting, unspecified: Secondary | ICD-10-CM

## 2016-08-02 DIAGNOSIS — K222 Esophageal obstruction: Secondary | ICD-10-CM | POA: Diagnosis not present

## 2016-08-02 DIAGNOSIS — K567 Ileus, unspecified: Secondary | ICD-10-CM | POA: Diagnosis not present

## 2016-08-02 DIAGNOSIS — Z79899 Other long term (current) drug therapy: Secondary | ICD-10-CM | POA: Diagnosis not present

## 2016-08-02 DIAGNOSIS — J9811 Atelectasis: Secondary | ICD-10-CM | POA: Diagnosis not present

## 2016-08-02 DIAGNOSIS — E039 Hypothyroidism, unspecified: Secondary | ICD-10-CM | POA: Diagnosis present

## 2016-08-02 DIAGNOSIS — Z91048 Other nonmedicinal substance allergy status: Secondary | ICD-10-CM | POA: Diagnosis not present

## 2016-08-02 DIAGNOSIS — R1310 Dysphagia, unspecified: Secondary | ICD-10-CM | POA: Diagnosis present

## 2016-08-02 DIAGNOSIS — K449 Diaphragmatic hernia without obstruction or gangrene: Secondary | ICD-10-CM | POA: Diagnosis not present

## 2016-08-02 DIAGNOSIS — R0902 Hypoxemia: Secondary | ICD-10-CM | POA: Diagnosis not present

## 2016-08-02 DIAGNOSIS — K3189 Other diseases of stomach and duodenum: Secondary | ICD-10-CM | POA: Diagnosis not present

## 2016-08-02 DIAGNOSIS — Z888 Allergy status to other drugs, medicaments and biological substances status: Secondary | ICD-10-CM

## 2016-08-02 DIAGNOSIS — K9189 Other postprocedural complications and disorders of digestive system: Secondary | ICD-10-CM | POA: Diagnosis not present

## 2016-08-02 DIAGNOSIS — J449 Chronic obstructive pulmonary disease, unspecified: Secondary | ICD-10-CM | POA: Diagnosis not present

## 2016-08-02 DIAGNOSIS — I10 Essential (primary) hypertension: Secondary | ICD-10-CM | POA: Diagnosis present

## 2016-08-02 DIAGNOSIS — Z8673 Personal history of transient ischemic attack (TIA), and cerebral infarction without residual deficits: Secondary | ICD-10-CM

## 2016-08-02 DIAGNOSIS — F411 Generalized anxiety disorder: Secondary | ICD-10-CM | POA: Diagnosis present

## 2016-08-02 DIAGNOSIS — K219 Gastro-esophageal reflux disease without esophagitis: Secondary | ICD-10-CM | POA: Diagnosis present

## 2016-08-02 HISTORY — PX: HIATAL HERNIA REPAIR: SHX195

## 2016-08-02 HISTORY — PX: LAPAROSCOPIC NISSEN FUNDOPLICATION: SHX1932

## 2016-08-02 SURGERY — REPAIR, HERNIA, HIATAL, LAPAROSCOPIC
Anesthesia: General | Site: Abdomen

## 2016-08-02 MED ORDER — LACTATED RINGERS IV SOLN
INTRAVENOUS | Status: DC | PRN
  Administered 2016-08-02 (×2): via INTRAVENOUS

## 2016-08-02 MED ORDER — OXYCODONE HCL 5 MG PO TABS: 5.0000 mg | ORAL_TABLET | Freq: Once | ORAL | Status: DC | PRN

## 2016-08-02 MED ORDER — DEXAMETHASONE SODIUM PHOSPHATE 10 MG/ML IJ SOLN
INTRAMUSCULAR | Status: AC
  Filled 2016-08-02: qty 1

## 2016-08-02 MED ORDER — MEPERIDINE HCL 50 MG/ML IJ SOLN: 6.2500 mg | INTRAMUSCULAR | Status: DC | PRN

## 2016-08-02 MED ORDER — SUCCINYLCHOLINE CHLORIDE 200 MG/10ML IV SOSY
PREFILLED_SYRINGE | INTRAVENOUS | Status: DC | PRN
  Administered 2016-08-02: 100 mg via INTRAVENOUS

## 2016-08-02 MED ORDER — LIP MEDEX EX OINT
TOPICAL_OINTMENT | CUTANEOUS | Status: AC
  Filled 2016-08-02: qty 7

## 2016-08-02 MED ORDER — SUGAMMADEX SODIUM 200 MG/2ML IV SOLN
INTRAVENOUS | Status: DC | PRN
  Administered 2016-08-02: 200 mg via INTRAVENOUS

## 2016-08-02 MED ORDER — LACTATED RINGERS IR SOLN
Status: DC | PRN
  Administered 2016-08-02: 1000 mL

## 2016-08-02 MED ORDER — LIDOCAINE 2% (20 MG/ML) 5 ML SYRINGE
INTRAMUSCULAR | Status: DC | PRN
  Administered 2016-08-02: 80 mL via INTRAVENOUS

## 2016-08-02 MED ORDER — OXYCODONE HCL 5 MG/5ML PO SOLN
5.0000 mg | Freq: Once | ORAL | Status: DC | PRN
  Filled 2016-08-02: qty 5

## 2016-08-02 MED ORDER — SODIUM CHLORIDE 0.9 % IJ SOLN
INTRAMUSCULAR | Status: AC
  Filled 2016-08-02: qty 50

## 2016-08-02 MED ORDER — PROPOFOL 10 MG/ML IV BOLUS
INTRAVENOUS | Status: DC | PRN
  Administered 2016-08-02: 100 mg via INTRAVENOUS

## 2016-08-02 MED ORDER — ROCURONIUM BROMIDE 10 MG/ML (PF) SYRINGE
PREFILLED_SYRINGE | INTRAVENOUS | Status: DC | PRN
  Administered 2016-08-02: 40 mg via INTRAVENOUS
  Administered 2016-08-02 (×2): 10 mg via INTRAVENOUS

## 2016-08-02 MED ORDER — HYDROMORPHONE HCL 1 MG/ML IJ SOLN
INTRAMUSCULAR | Status: AC
  Administered 2016-08-02: 0.5 mg via INTRAVENOUS
  Filled 2016-08-02: qty 1

## 2016-08-02 MED ORDER — HYDROMORPHONE HCL 1 MG/ML IJ SOLN
0.5000 mg | INTRAMUSCULAR | Status: DC | PRN
  Administered 2016-08-02 – 2016-08-03 (×5): 0.5 mg via INTRAVENOUS
  Filled 2016-08-02 (×4): qty 1

## 2016-08-02 MED ORDER — DEXMEDETOMIDINE HCL IN NACL 200 MCG/50ML IV SOLN
INTRAVENOUS | Status: AC
  Filled 2016-08-02: qty 50

## 2016-08-02 MED ORDER — ONDANSETRON HCL 4 MG/2ML IJ SOLN
INTRAMUSCULAR | Status: AC
  Filled 2016-08-02: qty 2

## 2016-08-02 MED ORDER — BUPIVACAINE LIPOSOME 1.3 % IJ SUSP
20.0000 mL | Freq: Once | INTRAMUSCULAR | Status: AC
  Administered 2016-08-02: 20 mL
  Filled 2016-08-02: qty 20

## 2016-08-02 MED ORDER — FENTANYL CITRATE (PF) 250 MCG/5ML IJ SOLN
INTRAMUSCULAR | Status: DC | PRN
  Administered 2016-08-02 (×3): 50 ug via INTRAVENOUS
  Administered 2016-08-02: 100 ug via INTRAVENOUS

## 2016-08-02 MED ORDER — HYDROMORPHONE HCL 1 MG/ML IJ SOLN
0.2500 mg | INTRAMUSCULAR | Status: DC | PRN
  Administered 2016-08-02 (×4): 0.5 mg via INTRAVENOUS

## 2016-08-02 MED ORDER — DEXMEDETOMIDINE HCL IN NACL 200 MCG/50ML IV SOLN
INTRAVENOUS | Status: DC | PRN
  Administered 2016-08-02 (×2): 12 ug via INTRAVENOUS

## 2016-08-02 MED ORDER — SODIUM CHLORIDE 0.9 % IJ SOLN
INTRAMUSCULAR | Status: DC | PRN
  Administered 2016-08-02: 15 mL

## 2016-08-02 MED ORDER — SODIUM CHLORIDE 0.9 % IV SOLN: 20.0000 mL | Freq: Once | INTRAVENOUS | Status: DC

## 2016-08-02 MED ORDER — ONDANSETRON HCL 4 MG/2ML IJ SOLN
4.0000 mg | Freq: Once | INTRAMUSCULAR | Status: AC | PRN
  Administered 2016-08-02: 4 mg via INTRAVENOUS

## 2016-08-02 MED ORDER — EPHEDRINE SULFATE 50 MG/ML IJ SOLN
INTRAMUSCULAR | Status: DC | PRN
  Administered 2016-08-02 (×2): 10 mg via INTRAVENOUS

## 2016-08-02 MED ORDER — CEFAZOLIN SODIUM-DEXTROSE 2-4 GM/100ML-% IV SOLN
INTRAVENOUS | Status: AC
  Filled 2016-08-02: qty 100

## 2016-08-02 MED ORDER — GABAPENTIN 100 MG PO CAPS
100.0000 mg | ORAL_CAPSULE | Freq: Three times a day (TID) | ORAL | Status: DC
  Administered 2016-08-02 – 2016-08-06 (×11): 100 mg via ORAL
  Filled 2016-08-02 (×11): qty 1

## 2016-08-02 MED ORDER — BUPIVACAINE-EPINEPHRINE 0.5% -1:200000 IJ SOLN
INTRAMUSCULAR | Status: DC | PRN
  Administered 2016-08-02: 35 mL

## 2016-08-02 MED ORDER — LACTATED RINGERS IV SOLN: INTRAVENOUS | Status: DC

## 2016-08-02 MED ORDER — ONDANSETRON HCL 4 MG/2ML IJ SOLN
4.0000 mg | Freq: Four times a day (QID) | INTRAMUSCULAR | Status: DC | PRN
  Administered 2016-08-03 – 2016-08-05 (×4): 4 mg via INTRAVENOUS
  Filled 2016-08-02 (×4): qty 2

## 2016-08-02 MED ORDER — ROCURONIUM BROMIDE 10 MG/ML (PF) SYRINGE
PREFILLED_SYRINGE | INTRAVENOUS | Status: AC
  Filled 2016-08-02: qty 10

## 2016-08-02 MED ORDER — ONDANSETRON 4 MG PO TBDP
4.0000 mg | ORAL_TABLET | Freq: Four times a day (QID) | ORAL | Status: DC | PRN
  Administered 2016-08-05 – 2016-08-06 (×2): 4 mg via ORAL
  Filled 2016-08-02 (×2): qty 1

## 2016-08-02 MED ORDER — 0.9 % SODIUM CHLORIDE (POUR BTL) OPTIME
TOPICAL | Status: DC | PRN
  Administered 2016-08-02: 1000 mL

## 2016-08-02 MED ORDER — ONDANSETRON HCL 4 MG/2ML IJ SOLN
INTRAMUSCULAR | Status: DC | PRN
  Administered 2016-08-02: 4 mg via INTRAVENOUS

## 2016-08-02 MED ORDER — KCL IN DEXTROSE-NACL 20-5-0.9 MEQ/L-%-% IV SOLN
INTRAVENOUS | Status: DC
  Administered 2016-08-02 – 2016-08-04 (×6): via INTRAVENOUS
  Filled 2016-08-02 (×6): qty 1000

## 2016-08-02 MED ORDER — EPHEDRINE 5 MG/ML INJ
INTRAVENOUS | Status: AC
  Filled 2016-08-02: qty 10

## 2016-08-02 MED ORDER — ACETAMINOPHEN 10 MG/ML IV SOLN
1000.0000 mg | Freq: Once | INTRAVENOUS | Status: AC
  Administered 2016-08-02: 1000 mg via INTRAVENOUS

## 2016-08-02 MED ORDER — OXYCODONE HCL 5 MG PO TABS
ORAL_TABLET | ORAL | Status: AC
  Administered 2016-08-02: 5 mg via ORAL
  Filled 2016-08-02: qty 1

## 2016-08-02 MED ORDER — CEFAZOLIN SODIUM-DEXTROSE 2-4 GM/100ML-% IV SOLN
2.0000 g | INTRAVENOUS | Status: AC
  Administered 2016-08-02: 2 g via INTRAVENOUS
  Filled 2016-08-02: qty 100

## 2016-08-02 MED ORDER — OXYCODONE HCL 5 MG PO TABS
5.0000 mg | ORAL_TABLET | ORAL | Status: DC | PRN
Start: 2016-08-02 — End: 2016-08-06
  Administered 2016-08-02 – 2016-08-03 (×2): 5 mg via ORAL
  Administered 2016-08-04: 10 mg via ORAL
  Administered 2016-08-04 (×2): 5 mg via ORAL
  Administered 2016-08-04 (×3): 10 mg via ORAL
  Administered 2016-08-05 (×2): 5 mg via ORAL
  Filled 2016-08-02: qty 1
  Filled 2016-08-02: qty 2
  Filled 2016-08-02 (×5): qty 1
  Filled 2016-08-02: qty 2
  Filled 2016-08-02: qty 1
  Filled 2016-08-02 (×2): qty 2

## 2016-08-02 MED ORDER — PROPOFOL 10 MG/ML IV BOLUS
INTRAVENOUS | Status: AC
  Filled 2016-08-02: qty 20

## 2016-08-02 MED ORDER — FENTANYL CITRATE (PF) 250 MCG/5ML IJ SOLN
INTRAMUSCULAR | Status: AC
  Filled 2016-08-02: qty 5

## 2016-08-02 MED ORDER — DEXAMETHASONE SODIUM PHOSPHATE 10 MG/ML IJ SOLN
INTRAMUSCULAR | Status: DC | PRN
  Administered 2016-08-02: 10 mg via INTRAVENOUS

## 2016-08-02 MED ORDER — KCL IN DEXTROSE-NACL 20-5-0.9 MEQ/L-%-% IV SOLN
INTRAVENOUS | Status: AC
  Filled 2016-08-02: qty 1000

## 2016-08-02 MED ORDER — ENOXAPARIN SODIUM 40 MG/0.4ML ~~LOC~~ SOLN
40.0000 mg | SUBCUTANEOUS | Status: DC
  Administered 2016-08-03 – 2016-08-06 (×4): 40 mg via SUBCUTANEOUS
  Filled 2016-08-02 (×4): qty 0.4

## 2016-08-02 MED ORDER — SUGAMMADEX SODIUM 200 MG/2ML IV SOLN
INTRAVENOUS | Status: AC
  Filled 2016-08-02: qty 2

## 2016-08-02 MED ORDER — LIDOCAINE 2% (20 MG/ML) 5 ML SYRINGE
INTRAMUSCULAR | Status: AC
  Filled 2016-08-02: qty 5

## 2016-08-02 MED ORDER — BUPIVACAINE-EPINEPHRINE 0.5% -1:200000 IJ SOLN
INTRAMUSCULAR | Status: AC
  Filled 2016-08-02: qty 1

## 2016-08-02 MED ORDER — SERTRALINE HCL 50 MG PO TABS
100.0000 mg | ORAL_TABLET | Freq: Every day | ORAL | Status: DC
  Administered 2016-08-02 – 2016-08-03 (×2): 100 mg via ORAL
  Filled 2016-08-02 (×2): qty 2

## 2016-08-02 MED ORDER — ACETAMINOPHEN 10 MG/ML IV SOLN
INTRAVENOUS | Status: AC
  Filled 2016-08-02: qty 100

## 2016-08-02 SURGICAL SUPPLY — 58 items
APPLIER CLIP 5 13 M/L LIGAMAX5 (MISCELLANEOUS)
APPLIER CLIP ROT 10 11.4 M/L (STAPLE) ×2
CABLE HIGH FREQUENCY MONO STRZ (ELECTRODE) ×2 IMPLANT
CHLORAPREP W/TINT 26ML (MISCELLANEOUS) ×2 IMPLANT
CLAMP ENDO BABCK 10MM (STAPLE) ×4 IMPLANT
CLIP APPLIE 5 13 M/L LIGAMAX5 (MISCELLANEOUS) IMPLANT
CLIP APPLIE ROT 10 11.4 M/L (STAPLE) ×1 IMPLANT
COVER SURGICAL LIGHT HANDLE (MISCELLANEOUS) IMPLANT
DECANTER SPIKE VIAL GLASS SM (MISCELLANEOUS) IMPLANT
DERMABOND ADVANCED (GAUZE/BANDAGES/DRESSINGS) ×1
DERMABOND ADVANCED .7 DNX12 (GAUZE/BANDAGES/DRESSINGS) ×1 IMPLANT
DEVICE SUT QUICK LOAD TK 5 (STAPLE) ×14 IMPLANT
DEVICE SUT TI-KNOT TK 5X26 (MISCELLANEOUS) ×2 IMPLANT
DEVICE SUTURE ENDOST 10MM (ENDOMECHANICALS) ×2 IMPLANT
DEVICE TROCAR PUNCTURE CLOSURE (ENDOMECHANICALS) IMPLANT
DISSECTOR BLUNT TIP ENDO 5MM (MISCELLANEOUS) ×2 IMPLANT
DRAIN PENROSE 18X1/2 LTX STRL (DRAIN) ×2 IMPLANT
DRAPE LAPAROSCOPIC ABDOMINAL (DRAPES) ×2 IMPLANT
DRAPE UTILITY XL STRL (DRAPES) ×2 IMPLANT
ELECT REM PT RETURN 9FT ADLT (ELECTROSURGICAL) ×2
ELECTRODE REM PT RTRN 9FT ADLT (ELECTROSURGICAL) ×1 IMPLANT
GLOVE BIOGEL PI IND STRL 7.5 (GLOVE) ×1 IMPLANT
GLOVE BIOGEL PI INDICATOR 7.5 (GLOVE) ×1
GLOVE ECLIPSE 7.5 STRL STRAW (GLOVE) ×2 IMPLANT
GOWN STRL REUS W/TWL LRG LVL3 (GOWN DISPOSABLE) ×2 IMPLANT
GOWN STRL REUS W/TWL XL LVL3 (GOWN DISPOSABLE) ×8 IMPLANT
GRASPER ENDO BABCOCK 10 (MISCELLANEOUS) IMPLANT
GRASPER ENDO BABCOCK 10MM (MISCELLANEOUS)
IRRIG SUCT STRYKERFLOW 2 WTIP (MISCELLANEOUS) ×2
IRRIGATION SUCT STRKRFLW 2 WTP (MISCELLANEOUS) ×1 IMPLANT
KIT BASIN OR (CUSTOM PROCEDURE TRAY) ×2 IMPLANT
NS IRRIG 1000ML POUR BTL (IV SOLUTION) ×2 IMPLANT
PAD POSITIONING PINK XL (MISCELLANEOUS) IMPLANT
POSITIONER SURGICAL ARM (MISCELLANEOUS) IMPLANT
SCISSORS LAP 5X35 DISP (ENDOMECHANICALS) ×2 IMPLANT
SHEARS HARMONIC ACE PLUS 36CM (ENDOMECHANICALS) ×2 IMPLANT
SLEEVE ADV FIXATION 5X100MM (TROCAR) IMPLANT
SLEEVE XCEL OPT CAN 5 100 (ENDOMECHANICALS) ×4 IMPLANT
SOLUTION ANTI FOG 6CC (MISCELLANEOUS) ×2 IMPLANT
STAPLER VISISTAT 35W (STAPLE) ×4 IMPLANT
SUT DEVICE BRAIDED 0X39 (SUTURE) ×10 IMPLANT
SUT MNCRL AB 4-0 PS2 18 (SUTURE) ×2 IMPLANT
SUT SURGIDAC NAB ES-9 0 48 120 (SUTURE) ×16 IMPLANT
TAPE CLOTH 4X10 WHT NS (GAUZE/BANDAGES/DRESSINGS) IMPLANT
TIP INNERVISION DETACH 40FR (MISCELLANEOUS) IMPLANT
TIP INNERVISION DETACH 50FR (MISCELLANEOUS) IMPLANT
TIP INNERVISION DETACH 56FR (MISCELLANEOUS) IMPLANT
TIPS INNERVISION DETACH 40FR (MISCELLANEOUS)
TOWEL OR 17X26 10 PK STRL BLUE (TOWEL DISPOSABLE) ×2 IMPLANT
TOWEL OR NON WOVEN STRL DISP B (DISPOSABLE) ×2 IMPLANT
TRAY FOLEY W/METER SILVER 14FR (SET/KITS/TRAYS/PACK) ×2 IMPLANT
TRAY LAPAROSCOPIC (CUSTOM PROCEDURE TRAY) ×2 IMPLANT
TROCAR ADV FIXATION 11X100MM (TROCAR) IMPLANT
TROCAR ADV FIXATION 5X100MM (TROCAR) ×2 IMPLANT
TROCAR BLADELESS OPT 5 100 (ENDOMECHANICALS) ×2 IMPLANT
TROCAR XCEL NON-BLD 11X100MML (ENDOMECHANICALS) IMPLANT
TROCAR XCEL UNIV SLVE 11M 100M (ENDOMECHANICALS) ×2 IMPLANT
TUBING INSUF HEATED (TUBING) ×2 IMPLANT

## 2016-08-02 NOTE — Transfer of Care (Signed)
Immediate Anesthesia Transfer of Care Note  Patient: Julie Rice  Procedure(s) Performed: Procedure(s): LAPAROSCOPIC REPAIR OF LARGE  HIATAL HERNIA (N/A) LAPAROSCOPIC NISSEN FUNDOPLICATION (N/A)  Patient Location: PACU  Anesthesia Type:General  Level of Consciousness: awake, alert  and patient cooperative  Airway & Oxygen Therapy: Patient Spontanous Breathing and Patient connected to face mask oxygen  Post-op Assessment: Report given to RN and Post -op Vital signs reviewed and stable  Post vital signs: Reviewed  Last Vitals:  Vitals:   08/02/16 0554  BP: (!) 178/89  Pulse: 85  Resp: 18  Temp: 36.4 C    Last Pain:  Vitals:   08/02/16 0554  TempSrc: Oral         Complications: No apparent anesthesia complications

## 2016-08-02 NOTE — Op Note (Signed)
Preoperative Diagnosis: Large hiatal hernia  Postoprative Diagnosis: Large hiatal hernia  Procedure: Procedure(s): LAPAROSCOPIC REPAIR OF LARGE  HIATAL HERNIA LAPAROSCOPIC NISSEN FUNDOPLICATION   Surgeon: Glenna Fellows T   Assistants: Avel Peace  Anesthesia:  General endotracheal anesthesia  Indications: Patient is a 80 year old female with a long history of progressively worsening symptoms of dysphagia and reflux. She recently has begun to have episodic severe chest pain in association. Cardiac workup has been negative. GI workup has revealed a large hiatal hernia with some suspicion of gastric volvulus. After preoperative workup and discussion detailed elsewhere we have elected to proceed with laparoscopic repair of her large hiatal hernia and Nissen fundoplication.    Procedure Detail:  Patient was brought to the operating room, placed in the supine position on the operating table, and general endotracheal anesthesia induced. She received preoperative IV antibiotics. PAS were in place. The abdomen was widely sterilely prepped and draped. Patient timeout was performed and correct procedure verified. Access was obtained with a 5 mm Optiview trocar in the left upper quadrant without difficulty and pneumoperitoneum established. There was no evidence of trocar injury. Under direct vision a 5 mm trocar was placed laterally in the right upper quadrant, 12 mm trocar in the right upper abdomen. The midline, a 5 mm trocar just above and to the left of the umbilicus for the camera port and a 5 mm trocar laterally in the left upper quadrant. Through the subxiphoid site using a Nathanson retractor the left lobe of the liver was elevated with excellent exposure of the stomach and hiatus. There was a large hiatal hernia present as expected. The stomach was able to be reduced out of the hernia with minimal difficulty. There was an obvious left to right twist of the stomach with a area of the stomach  in the mid body showing some discoloration suggesting obstruction at this site intermittently. Retracting the stomach inferiorly the gastrohepatic omentum was divided with Harmonic scalpel. Dissection was carried up along the right crus and over the top of the hiatus. The hernia sac was dissected down out of the mediastinum. Dissection was carried down along the anterior portion of the left crus further dissecting the hernia sac and the esophagus was clearly identified and protected in the mediastinum. The greater curve was then dissected dividing short gastrics and the lesser curve was entered. The dissection continued proximally along the fundus and adhesions to the spleen into the left crus were divided mobilizing the posterior portion of the left crus down to the confluence. We then further dissected the posterior portion of the right crus and were able to pass Penrose drain around the EG junction. Using this for retraction the posterior portion of the crura were completely dissected. The esophagus was fully mobilized out of the mediastinum until several centimeters of esophagus lay in the abdominal cavity under no tension. The crura were healthy appearing structures. A hernia repair was then performed with 0 Ethibond interrupted sutures beginning posteriorly. Prior to the final suture a lighted 50 bougie dilator was passed into the stomach and the closure was calibrated to allow a little extra room around the dilator. The dilator was withdrawn after the crural repair back into the esophagus. A mobile portion of fundus was identified and brought posterior to the esophagus. A contiguous fundal wrap was then fashioned a 360 wrap created incorporating the phrenoesophageal ligament and the initial bite and 2 further sutures were used incorporating a small bite of anterior esophagus creating a 2 cm floppy  wrap around the 50 bougie dilator. The dilator was withdrawn without difficulty. There was no evidence of injury  or bleeding. A TA P block using Exparel and Marcaine was performed under direct vision bilaterally. The Nathanson retractor was removed under direct vision and all CO2 evacuated and trochars removed. Skin incisions were closed with septic for Monocryl and Dermabond. Sponge needle and instrument counts were correct.    Findings: As above  Estimated Blood Loss:  Minimal         Drains: None  Blood Given: none          Specimens: None        Complications:  * No complications entered in OR log *         Disposition: PACU - hemodynamically stable.         Condition: stable

## 2016-08-02 NOTE — Anesthesia Procedure Notes (Signed)
Procedure Name: Intubation Date/Time: 08/02/2016 7:43 AM Performed by: Delphia Grates Pre-anesthesia Checklist: Emergency Drugs available, Suction available, Patient being monitored and Patient identified Patient Re-evaluated:Patient Re-evaluated prior to inductionOxygen Delivery Method: Circle system utilized Preoxygenation: Pre-oxygenation with 100% oxygen Intubation Type: IV induction, Rapid sequence and Cricoid Pressure applied Laryngoscope Size: Mac and 4 Grade View: Grade I Tube type: Oral Laser Tube: Cuffed inflated with minimal occlusive pressure - saline Tube size: 7.5 mm Number of attempts: 1 Airway Equipment and Method: Stylet Placement Confirmation: ETT inserted through vocal cords under direct vision,  positive ETCO2 and breath sounds checked- equal and bilateral Secured at: 21 cm Tube secured with: Tape Dental Injury: Teeth and Oropharynx as per pre-operative assessment

## 2016-08-02 NOTE — Anesthesia Preprocedure Evaluation (Addendum)
Anesthesia Evaluation  Patient identified by MRN, date of birth, ID band Patient awake    Reviewed: Allergy & Precautions, NPO status , Patient's Chart, lab work & pertinent test results  History of Anesthesia Complications (+) PONV  Airway Mallampati: I  TM Distance: >3 FB Neck ROM: Full    Dental  (+) Teeth Intact, Dental Advisory Given   Pulmonary COPD,    breath sounds clear to auscultation       Cardiovascular hypertension, Pt. on medications + Valvular Problems/Murmurs MR and AI  Rhythm:Regular Rate:Normal     Neuro/Psych    GI/Hepatic hiatal hernia, GERD  ,  Endo/Other  Hypothyroidism   Renal/GU      Musculoskeletal   Abdominal   Peds  Hematology   Anesthesia Other Findings Mild AI and moderate MR with good LV function (2011).  Reproductive/Obstetrics                            Anesthesia Physical Anesthesia Plan  ASA: III  Anesthesia Plan: General   Post-op Pain Management:    Induction: Intravenous  Airway Management Planned: Oral ETT  Additional Equipment:   Intra-op Plan:   Post-operative Plan: Extubation in OR  Informed Consent: I have reviewed the patients History and Physical, chart, labs and discussed the procedure including the risks, benefits and alternatives for the proposed anesthesia with the patient or authorized representative who has indicated his/her understanding and acceptance.   Dental advisory given  Plan Discussed with: CRNA, Anesthesiologist and Surgeon  Anesthesia Plan Comments:         Anesthesia Quick Evaluation

## 2016-08-02 NOTE — Interval H&P Note (Signed)
History and Physical Interval Note:  08/02/2016 7:30 AM  Julie Rice  has presented today for surgery, with the diagnosis of Large hiatal hernia  The various methods of treatment have been discussed with the patient and family. After consideration of risks, benefits and other options for treatment, the patient has consented to  Procedure(s): LAPAROSCOPIC REPAIR OF HIATAL HERNIA (N/A) LAPAROSCOPIC NISSEN FUNDOPLICATION (N/A) as a surgical intervention .  The patient's history has been reviewed, patient examined, no change in status, stable for surgery.  I have reviewed the patient's chart and labs.  Questions were answered to the patient's satisfaction.     Dusan Lipford T

## 2016-08-02 NOTE — Anesthesia Postprocedure Evaluation (Signed)
Anesthesia Post Note  Patient: Sahmya C Dara  Procedure(s) Performed: Procedure(s) (LRB): LAPAROSCOPIC REPAIR OF LARGE  HIATAL HERNIA (N/A) LAPAROSCOPIC NISSEN FUNDOPLICATION (N/A)  Patient location during evaluation: PACU Anesthesia Type: General Level of consciousness: awake and alert Pain management: pain level controlled Vital Signs Assessment: post-procedure vital signs reviewed and stable Respiratory status: spontaneous breathing, nonlabored ventilation, respiratory function stable and patient connected to nasal cannula oxygen Cardiovascular status: blood pressure returned to baseline and stable Postop Assessment: no signs of nausea or vomiting Anesthetic complications: no    Last Vitals:  Vitals:   08/02/16 1145 08/02/16 1200  BP: 117/73 132/71  Pulse: 73 75  Resp: 17 11  Temp:      Last Pain:  Vitals:   08/02/16 1145  TempSrc:   PainSc: 4                  Mandisa Persinger A

## 2016-08-03 LAB — CBC
HCT: 27.9 % — ABNORMAL LOW (ref 36.0–46.0)
Hemoglobin: 8.7 g/dL — ABNORMAL LOW (ref 12.0–15.0)
MCH: 27.2 pg (ref 26.0–34.0)
MCHC: 31.2 g/dL (ref 30.0–36.0)
MCV: 87.2 fL (ref 78.0–100.0)
PLATELETS: 192 10*3/uL (ref 150–400)
RBC: 3.2 MIL/uL — AB (ref 3.87–5.11)
RDW: 15.9 % — ABNORMAL HIGH (ref 11.5–15.5)
WBC: 10.8 10*3/uL — ABNORMAL HIGH (ref 4.0–10.5)

## 2016-08-03 LAB — BASIC METABOLIC PANEL
Anion gap: 4 — ABNORMAL LOW (ref 5–15)
BUN: 21 mg/dL — AB (ref 6–20)
CO2: 32 mmol/L (ref 22–32)
CREATININE: 0.89 mg/dL (ref 0.44–1.00)
Calcium: 8.3 mg/dL — ABNORMAL LOW (ref 8.9–10.3)
Chloride: 105 mmol/L (ref 101–111)
GFR calc Af Amer: >60 mL/min (ref >60)
Glucose, Bld: 116 mg/dL — ABNORMAL HIGH (ref 65–99)
POTASSIUM: 4 mmol/L (ref 3.5–5.1)
SODIUM: 141 mmol/L (ref 135–145)

## 2016-08-03 MED ORDER — SERTRALINE HCL 50 MG PO TABS
50.0000 mg | ORAL_TABLET | Freq: Two times a day (BID) | ORAL | Status: DC
  Administered 2016-08-04 – 2016-08-06 (×5): 50 mg via ORAL
  Filled 2016-08-03 (×5): qty 1

## 2016-08-03 MED ORDER — ALUM & MAG HYDROXIDE-SIMETH 200-200-20 MG/5ML PO SUSP
30.0000 mL | Freq: Four times a day (QID) | ORAL | Status: DC | PRN
  Administered 2016-08-03 (×2): 30 mL via ORAL
  Filled 2016-08-03 (×2): qty 30

## 2016-08-03 MED ORDER — CLONAZEPAM 0.5 MG PO TABS
0.5000 mg | ORAL_TABLET | Freq: Three times a day (TID) | ORAL | Status: DC | PRN
  Administered 2016-08-03 – 2016-08-06 (×8): 0.5 mg via ORAL
  Filled 2016-08-03 (×8): qty 1

## 2016-08-03 NOTE — Progress Notes (Signed)
Dr. Gerrit Friends aware via phone pt recently became shaky with a mild H/A. Pt reported "i probably need my nerve pills". MD aware pt on Klonopin and Zoloft.Temp 100.8. Pushed IS. See new orders received.

## 2016-08-03 NOTE — Progress Notes (Signed)
Patient ID: Julie Rice, female   DOB: 1936-10-30, 80 y.o.   MRN: [id number] [facility] Surgery Progress Note:   1 Day Post-Op  Subjective: Mental status is clear.  No complaints Objective: Vital signs in last 24 hours: Temp:  [97.6 F (36.4 C)-98.7 F (37.1 C)] 98.5 F (36.9 C) (09/30 0609) Pulse Rate:  [69-90] 86 (09/30 0609) Resp:  [11-21] 18 (09/30 0609) BP: (103-158)/(39-80) 149/79 (09/30 0609) SpO2:  [96 %-100 %] 99 % (09/30 0609)  Intake/Output from previous day: 09/29 0701 - 09/30 0700 In: 3105 [P.O.:330; I.V.:2675] Out: 1600 [Urine:1500; Blood:100] Intake/Output this shift: No intake/output data recorded.  Physical Exam: Work of breathing is normal.  Abdomen is sore   Lab Results:  Results for orders placed or performed during the hospital encounter of 08/02/16 (from the past 48 hour(s))  CBC     Status: Abnormal   Collection Time: 08/03/16  4:41 AM  Result Value Ref Range   WBC 10.8 (H) 4.0 - 10.5 K/uL   RBC 3.20 (L) 3.87 - 5.11 MIL/uL   Hemoglobin 8.7 (L) 12.0 - 15.0 g/dL   HCT 27.9 (L) 36.0 - 46.0 %   MCV 87.2 78.0 - 100.0 fL   MCH 27.2 26.0 - 34.0 pg   MCHC 31.2 30.0 - 36.0 g/dL   RDW 15.9 (H) 11.5 - 15.5 %   Platelets 192 150 - 400 K/uL  Basic metabolic panel     Status: Abnormal   Collection Time: 08/03/16  4:41 AM  Result Value Ref Range   Sodium 141 135 - 145 mmol/L   Potassium 4.0 3.5 - 5.1 mmol/L   Chloride 105 101 - 111 mmol/L   CO2 32 22 - 32 mmol/L   Glucose, Bld 116 (H) 65 - 99 mg/dL   BUN 21 (H) 6 - 20 mg/dL   Creatinine, Ser 0.89 0.44 - 1.00 mg/dL   Calcium 8.3 (L) 8.9 - 10.3 mg/dL   GFR calc non Af Amer >60 >60 mL/min   GFR calc Af Amer >60 >60 mL/min    Comment: (NOTE) The eGFR has been calculated using the CKD EPI equation. This calculation has not been validated in all clinical situations. eGFR's persistently <60 mL/min signify possible Chronic Kidney Disease.    Anion gap 4 (L) 5 - 15    Radiology/Results: No results  found.  Anti-infectives: Anti-infectives    Start     Dose/Rate Route Frequency Ordered Stop   08/02/16 0557  ceFAZolin (ANCEF) IVPB 2g/100 mL premix     2 g 200 mL/hr over 30 Minutes Intravenous On call to O.R. 08/02/16 0557 08/02/16 0754      Assessment/Plan: Problem List: Patient Active Problem List   Diagnosis Date Noted  . Periesophageal hiatal hernia 08/02/2016  . Dysphagia 05/10/2016  . Esophageal stenosis 05/10/2016  . Abdominal pain, epigastric 05/10/2016  . Generalized anxiety disorder 02/06/2012  . Major depressive disorder, recurrent ([place]) 02/05/2012  . Hypokalemia 11/09/2011  . Nausea and vomiting in adult 11/08/2011  . Dehydration 11/08/2011  . Tremors of nervous system 11/08/2011  . Hypothyroidism 05/10/2008  . Dyslipidemia 05/10/2008  . UNSPECIFIED ANEMIA 05/10/2008  . CAROTID ARTERY DISEASE 05/10/2008  . TIA 05/10/2008  . Chronic back pain 05/10/2008  . OSTEOPENIA 05/10/2008  . ESOPHAGEAL STRICTURE 01/19/2008  . ANXIETY 04/29/2007  . Essential hypertension 04/29/2007  . GERD 04/29/2007  . Osteoarthritis 04/29/2007  . SPONDYLOSIS 04/29/2007    Start clear liquids PO.   1 Day Post-Op  LOS: 1 day   Matt B. Hassell Done, MD, [facility] [phone] beeper [phone]  08/03/2016 9:12 AM

## 2016-08-04 LAB — BASIC METABOLIC PANEL
Anion gap: 6 (ref 5–15)
BUN: 9 mg/dL (ref 6–20)
CHLORIDE: 107 mmol/L (ref 101–111)
CO2: 27 mmol/L (ref 22–32)
Calcium: 8.2 mg/dL — ABNORMAL LOW (ref 8.9–10.3)
Creatinine, Ser: 0.58 mg/dL (ref 0.44–1.00)
GFR calc Af Amer: >60 mL/min (ref >60)
GFR calc non Af Amer: >60 mL/min (ref >60)
GLUCOSE: 123 mg/dL — AB (ref 65–99)
POTASSIUM: 4.3 mmol/L (ref 3.5–5.1)
Sodium: 140 mmol/L (ref 135–145)

## 2016-08-04 LAB — CBC
HEMATOCRIT: 28.7 % — AB (ref 36.0–46.0)
HEMOGLOBIN: 8.8 g/dL — AB (ref 12.0–15.0)
MCH: 27.2 pg (ref 26.0–34.0)
MCHC: 30.7 g/dL (ref 30.0–36.0)
MCV: 88.9 fL (ref 78.0–100.0)
Platelets: 160 10*3/uL (ref 150–400)
RBC: 3.23 MIL/uL — ABNORMAL LOW (ref 3.87–5.11)
RDW: 15.9 % — ABNORMAL HIGH (ref 11.5–15.5)
WBC: 11.7 10*3/uL — AB (ref 4.0–10.5)

## 2016-08-04 NOTE — Progress Notes (Signed)
Patient ID: Julie Rice, female   DOB: 1936/08/11, 80 y.o.   MRN: [id number] [facility] Progress Note:   2 Days Post-Op  Subjective: Mental status is clear.  She didn't have a good night and was having thigh pain and uncomfortable legs.  Taking clears OK.  Wanted to restart her meds Objective: Vital signs in last 24 hours: Temp:  [98.6 F (37 C)-100.8 F (38.2 C)] 98.6 F (37 C) (10/01 0533) Pulse Rate:  [83-101] 91 (10/01 0533) Resp:  [16-18] 16 (10/01 0533) BP: (106-155)/(65-84) 155/73 (10/01 0533) SpO2:  [94 %-99 %] 95 % (10/01 0533)  Intake/Output from previous day: 09/30 0701 - 10/01 0700 In: 3070 [P.O.:670; I.V.:2400] Out: 850 [Urine:850] Intake/Output this shift: Total I/O In: 0  Out: 500 [Urine:500]  Physical Exam: Work of breathing is normal.  Not quite ready to go home.  Low grade temp noted.  Will recheck CBC in AM.  Legs are symmetric, normothermic and not swollen.  No palpable tenderness  Lab Results:  Results for orders placed or performed during the hospital encounter of 08/02/16 (from the past 48 hour(s))  CBC     Status: Abnormal   Collection Time: 08/03/16  4:41 AM  Result Value Ref Range   WBC 10.8 (H) 4.0 - 10.5 K/uL   RBC 3.20 (L) 3.87 - 5.11 MIL/uL   Hemoglobin 8.7 (L) 12.0 - 15.0 g/dL   HCT 27.9 (L) 36.0 - 46.0 %   MCV 87.2 78.0 - 100.0 fL   MCH 27.2 26.0 - 34.0 pg   MCHC 31.2 30.0 - 36.0 g/dL   RDW 15.9 (H) 11.5 - 15.5 %   Platelets 192 150 - 400 K/uL  Basic metabolic panel     Status: Abnormal   Collection Time: 08/03/16  4:41 AM  Result Value Ref Range   Sodium 141 135 - 145 mmol/L   Potassium 4.0 3.5 - 5.1 mmol/L   Chloride 105 101 - 111 mmol/L   CO2 32 22 - 32 mmol/L   Glucose, Bld 116 (H) 65 - 99 mg/dL   BUN 21 (H) 6 - 20 mg/dL   Creatinine, Ser 0.89 0.44 - 1.00 mg/dL   Calcium 8.3 (L) 8.9 - 10.3 mg/dL   GFR calc non Af Amer >60 >60 mL/min   GFR calc Af Amer >60 >60 mL/min    Comment: (NOTE) The eGFR has been  calculated using the CKD EPI equation. This calculation has not been validated in all clinical situations. eGFR's persistently <60 mL/min signify possible Chronic Kidney Disease.    Anion gap 4 (L) 5 - 15  CBC     Status: Abnormal   Collection Time: 08/04/16  4:27 AM  Result Value Ref Range   WBC 11.7 (H) 4.0 - 10.5 K/uL   RBC 3.23 (L) 3.87 - 5.11 MIL/uL   Hemoglobin 8.8 (L) 12.0 - 15.0 g/dL   HCT 28.7 (L) 36.0 - 46.0 %   MCV 88.9 78.0 - 100.0 fL   MCH 27.2 26.0 - 34.0 pg   MCHC 30.7 30.0 - 36.0 g/dL   RDW 15.9 (H) 11.5 - 15.5 %   Platelets 160 150 - 400 K/uL  Basic metabolic panel     Status: Abnormal   Collection Time: 08/04/16  4:27 AM  Result Value Ref Range   Sodium 140 135 - 145 mmol/L   Potassium 4.3 3.5 - 5.1 mmol/L   Chloride 107 101 - 111 mmol/L   CO2 27 22 - 32 mmol/L  Glucose, Bld 123 (H) 65 - 99 mg/dL   BUN 9 6 - 20 mg/dL   Creatinine, Ser 0.58 0.44 - 1.00 mg/dL   Calcium 8.2 (L) 8.9 - 10.3 mg/dL   GFR calc non Af Amer >60 >60 mL/min   GFR calc Af Amer >60 >60 mL/min    Comment: (NOTE) The eGFR has been calculated using the CKD EPI equation. This calculation has not been validated in all clinical situations. eGFR's persistently <60 mL/min signify possible Chronic Kidney Disease.    Anion gap 6 5 - 15    Radiology/Results: No results found.  Anti-infectives: Anti-infectives    Start     Dose/Rate Route Frequency Ordered Stop   08/02/16 0557  ceFAZolin (ANCEF) IVPB 2g/100 mL premix     2 g 200 mL/hr over 30 Minutes Intravenous On call to O.R. 08/02/16 0557 08/02/16 0754      Assessment/Plan: Problem List: Patient Active Problem List   Diagnosis Date Noted  . Periesophageal hiatal hernia 08/02/2016  . Dysphagia 05/10/2016  . Esophageal stenosis 05/10/2016  . Abdominal pain, epigastric 05/10/2016  . Generalized anxiety disorder 02/06/2012  . Major depressive disorder, recurrent ([place]) 02/05/2012  . Hypokalemia 11/09/2011  . Nausea and vomiting  in adult 11/08/2011  . Dehydration 11/08/2011  . Tremors of nervous system 11/08/2011  . Hypothyroidism 05/10/2008  . Dyslipidemia 05/10/2008  . UNSPECIFIED ANEMIA 05/10/2008  . CAROTID ARTERY DISEASE 05/10/2008  . TIA 05/10/2008  . Chronic back pain 05/10/2008  . OSTEOPENIA 05/10/2008  . ESOPHAGEAL STRICTURE 01/19/2008  . ANXIETY 04/29/2007  . Essential hypertension 04/29/2007  . GERD 04/29/2007  . Osteoarthritis 04/29/2007  . SPONDYLOSIS 04/29/2007    CBC in am.  Advance to full liquids 2 Days Post-Op    LOS: 2 days   Matt B. Hassell Done, MD, [facility] [phone] beeper [phone]  08/04/2016 10:20 AM

## 2016-08-04 NOTE — Progress Notes (Signed)
Dr. Daphine Deutscher notified of pt current BP 184/85 pulse 90. Pt states she takes lasix normally. Awaiting orders.

## 2016-08-05 ENCOUNTER — Inpatient Hospital Stay (HOSPITAL_COMMUNITY): Payer: Medicare Other

## 2016-08-05 LAB — CBC WITH DIFFERENTIAL/PLATELET
BASOS PCT: 0 %
Basophils Absolute: 0 10*3/uL (ref 0.0–0.1)
EOS ABS: 0.2 10*3/uL (ref 0.0–0.7)
Eosinophils Relative: 2 %
HCT: 30.3 % — ABNORMAL LOW (ref 36.0–46.0)
Hemoglobin: 9.3 g/dL — ABNORMAL LOW (ref 12.0–15.0)
Lymphocytes Relative: 7 %
Lymphs Abs: 0.8 10*3/uL (ref 0.7–4.0)
MCH: 28.1 pg (ref 26.0–34.0)
MCHC: 30.7 g/dL (ref 30.0–36.0)
MCV: 91.5 fL (ref 78.0–100.0)
MONO ABS: 0.8 10*3/uL (ref 0.1–1.0)
MONOS PCT: 7 %
NEUTROS PCT: 84 %
Neutro Abs: 9.1 10*3/uL — ABNORMAL HIGH (ref 1.7–7.7)
Platelets: 193 10*3/uL (ref 150–400)
RBC: 3.31 MIL/uL — ABNORMAL LOW (ref 3.87–5.11)
RDW: 16.1 % — AB (ref 11.5–15.5)
WBC: 10.8 10*3/uL — ABNORMAL HIGH (ref 4.0–10.5)

## 2016-08-05 MED ORDER — IRBESARTAN 150 MG PO TABS
300.0000 mg | ORAL_TABLET | Freq: Every day | ORAL | Status: DC
  Administered 2016-08-05 – 2016-08-06 (×2): 300 mg via ORAL
  Filled 2016-08-05 (×2): qty 2

## 2016-08-05 MED ORDER — IRBESARTAN 150 MG PO TABS: 300.0000 mg | ORAL_TABLET | Freq: Every day | ORAL | Status: DC

## 2016-08-05 MED ORDER — FUROSEMIDE 10 MG/ML IJ SOLN
20.0000 mg | Freq: Once | INTRAMUSCULAR | Status: AC
  Administered 2016-08-05: 20 mg via INTRAVENOUS
  Filled 2016-08-05: qty 2

## 2016-08-05 MED ORDER — FUROSEMIDE 40 MG PO TABS
40.0000 mg | ORAL_TABLET | Freq: Every day | ORAL | Status: DC
  Administered 2016-08-05 – 2016-08-06 (×2): 40 mg via ORAL
  Filled 2016-08-05 (×2): qty 1

## 2016-08-05 MED ORDER — PROMETHAZINE HCL 25 MG/ML IJ SOLN
12.5000 mg | Freq: Four times a day (QID) | INTRAMUSCULAR | Status: DC | PRN
  Administered 2016-08-05: 12.5 mg via INTRAVENOUS
  Filled 2016-08-05: qty 1

## 2016-08-05 MED ORDER — TELMISARTAN-HCTZ 80-12.5 MG PO TABS: 1.0000 | ORAL_TABLET | Freq: Every day | ORAL | Status: DC

## 2016-08-05 MED ORDER — HYDROCHLOROTHIAZIDE 12.5 MG PO CAPS
12.5000 mg | ORAL_CAPSULE | Freq: Every day | ORAL | Status: DC
  Administered 2016-08-05 – 2016-08-06 (×2): 12.5 mg via ORAL
  Filled 2016-08-05 (×2): qty 1

## 2016-08-05 NOTE — Progress Notes (Signed)
Orders received from Dr. Daphine Deutscher to give 20mg  Lasix now and to restart her home dose of marcardis hct in the am.

## 2016-08-05 NOTE — Progress Notes (Signed)
Dr. Daphine Deutscher notified of pt having BP 179/92 and a pulse of 106 after receiving 20mg  of Lasix IV. Pt output since admin of lasix.

## 2016-08-05 NOTE — Progress Notes (Signed)
3 Days Post-Op  Subjective: Not a good night, up frequently due to lasix.  Abdomen "sore", no severe pain.  Tol FL with mild occ nausea.  + flatus, no BM  Objective: Vital signs in last 24 hours: Temp:  [98.3 F (36.8 C)-98.5 F (36.9 C)] 98.5 F (36.9 C) (10/02 0540) Pulse Rate:  [86-106] 100 (10/02 0540) Resp:  [19-22] 19 (10/02 0540) BP: (149-184)/(61-93) 170/93 (10/02 0540) SpO2:  [99 %-100 %] 99 % (10/02 0540) Last BM Date: 08/02/16  Intake/Output from previous day: 10/01 0701 - 10/02 0700 In: 2300 [I.V.:2300] Out: 1550 [Urine:1550] Intake/Output this shift: No intake/output data recorded.  General appearance: alert, cooperative and no distress Resp: clear to auscultation bilaterally GI: Non distended, minmal tenderness Extremities: no edema, redness or tenderness in the calves or thighs  Lab Results:   Recent Labs  08/04/16 0427 08/05/16 0427  WBC 11.7* 10.8*  HGB 8.8* 9.3*  HCT 28.7* 30.3*  PLT 160 193   BMET  Recent Labs  08/03/16 0441 08/04/16 0427  NA 141 140  K 4.0 4.3  CL 105 107  CO2 32 27  GLUCOSE 116* 123*  BUN 21* 9  CREATININE 0.89 0.58  CALCIUM 8.3* 8.2*     Studies/Results: No results found.  Anti-infectives: Anti-infectives    Start     Dose/Rate Route Frequency Ordered Stop   08/02/16 0557  ceFAZolin (ANCEF) IVPB 2g/100 mL premix     2 g 200 mL/hr over 30 Minutes Intravenous On call to O.R. 08/02/16 0557 08/02/16 0754      Assessment/Plan: s/p Procedure(s): LAPAROSCOPIC REPAIR OF LARGE  HIATAL HERNIA LAPAROSCOPIC NISSEN FUNDOPLICATION Slow recovery but no definite complications identified HTN-IVF reduced and back on home meds Mild blood loss anemia Plan to ambulate this AM, check O2 sat on room air, possible discharge later today   LOS: 3 days    Ulrick Methot T 10/2/2017Patient ID: Julie Rice, female   DOB: Sep 28, 1936, 80 y.o.   MRN: [id number]

## 2016-08-06 MED ORDER — ENALAPRILAT 1.25 MG/ML IV SOLN
0.6250 mg | Freq: Four times a day (QID) | INTRAVENOUS | Status: DC | PRN
  Filled 2016-08-06: qty 0.5

## 2016-08-06 NOTE — Discharge Instructions (Signed)
[facility] ______[facility] LAPAROSCOPIC SURGERY: POST OP INSTRUCTIONS Always review your discharge instruction sheet given to you by the facility where your surgery was performed. IF YOU HAVE DISABILITY OR FAMILY LEAVE FORMS, YOU MUST BRING THEM TO THE OFFICE FOR PROCESSING.   DO NOT GIVE THEM TO YOUR DOCTOR.  1. A prescription for pain medication may be given to you upon discharge.  Take your pain medication as prescribed, if needed.  If narcotic pain medicine is not needed, then you may take acetaminophen (Tylenol) or ibuprofen (Advil) as needed. 2. Take your usually prescribed medications unless otherwise directed. 3. If you need a refill on your pain medication, please contact your pharmacy.  They will contact our office to request authorization. Prescriptions will not be filled after 5pm or on week-ends. 4. You should follow a light diet the first few days after arrival home, such as soup and crackers, etc.  Be sure to include lots of fluids daily. 5. Most patients will experience some swelling and bruising in the area of the incisions.  Ice packs will help.  Swelling and bruising can take several days to resolve.  6. It is common to experience some constipation if taking pain medication after surgery.  Increasing fluid intake and taking a stool softener (such as Colace) will usually help or prevent this problem from occurring.  A mild laxative (Milk of Magnesia or Miralax) should be taken according to package instructions if there are no bowel movements after 48 hours. 7. Unless discharge instructions indicate otherwise, you may remove your bandages 24-48 hours after surgery, and you may shower at that time.  You may have steri-strips (small skin tapes) in place directly over the incision.  These strips should be left on the skin for 7-10 days.  If your surgeon used skin glue on the incision, you may shower in 24 hours.  The glue will flake off over the next 2-3 weeks.  Any sutures or  staples will be removed at the office during your follow-up visit. 8. ACTIVITIES:  You may resume regular (light) daily activities beginning the next day--such as daily self-care, walking, climbing stairs--gradually increasing activities as tolerated.  You may have sexual intercourse when it is comfortable.  Refrain from any heavy lifting or straining until approved by your doctor. a. You may drive when you are no longer taking prescription pain medication, you can comfortably wear a seatbelt, and you can safely maneuver your car and apply brakes. b. RETURN TO WORK:  __________________________________________________________ 9. You should see your doctor in the office for a follow-up appointment approximately 2-3 weeks after your surgery.  Make sure that you call for this appointment within a day or two after you arrive home to insure a convenient appointment time. 10. OTHER INSTRUCTIONS: ______________________ liquid diet only for 2 weeks then may gradually add solid food ____________________________________________________________________________________________________ __________________________________________________________________________________________________________________________ WHEN TO CALL YOUR DOCTOR: 1. Fever over 101.0 2. Inability to urinate 3. Continued bleeding from incision. 4. Increased pain, redness, or drainage from the incision. 5. Increasing abdominal pain  The clinic staff is available to answer your questions during regular business hours.  Please dont hesitate to call and ask to speak to one of the nurses for clinical concerns.  If you have a medical emergency, go to the nearest emergency room or call 911.  A surgeon from [facility] is always on call at the hospital. [address], [place], [place]  [address] ? [address] [address], [place], [place]  [address] [phone] ? [phone] ? FAX [phone] Web site:  [email or web address]

## 2016-08-06 NOTE — Progress Notes (Signed)
Date: August 06, 2016 Discharge orders checked for needs. No needs present at time of discharge. Marcelle Smiling, RN, BSN, [place]   [phone]

## 2016-08-06 NOTE — Discharge Summary (Signed)
  Patient ID: Julie Rice [id number] 80 y.o. May 18, 1936  08/02/2016  Discharge date and time: 08/06/2016   Admitting Physician: Glenna Fellows T  Discharge Physician: Glenna Fellows T  Admission Diagnoses: Large hiatal hernia  Discharge Diagnoses: Same  Operations: Procedure(s): LAPAROSCOPIC REPAIR OF LARGE  HIATAL HERNIA LAPAROSCOPIC NISSEN FUNDOPLICATION  Admission Condition: fair  Discharged Condition: good  Indication for Admission: She is a 80 year old female with a long-standing history of GERD and recent gradually increasing dysphagia and episodic postprandial chest pain which has been severe at times. Workup reveals a large paraesophageal hiatal hernia with some evidence of gastric volvulus. With these findings after extensive discussion detailed elsewhere we elected to proceed with laparoscopic repair of her large hiatal hernia and Nissen fundoplication.  Hospital Course: Patient underwent uneventful laparoscopic repair of a large paraesophageal hiatal  hernia with Nissen fundoplication. Her postoperative course was uncomplicated. She was started on ice water on the first postoperative day and advanced to a full liquid diet. On the second postoperative day she was having some nausea and also some borderline O2 sats off oxygen and she was observed. On the third postoperative day she was more comfortable but did have one episode of vomiting and room air O2 sats were low at 80. Chest x-ray showed some bibasilar atelectasis and abdominal x-rays were unremarkable. She was observed and continued pulmonary toilet and on the day of discharge is feeling significantly better. Tolerating a full liquid diet without nausea. O2 sats on oxygen are 93-94 and she has no shortness of breath. She is felt ready for discharge.   Disposition: Home  Patient Instructions:    Medication List    TAKE these medications   aspirin 81 MG tablet Take 1 tablet (81 mg total) by mouth daily. For  cardiovascular health.   clonazePAM 0.5 MG tablet Commonly known as:  KLONOPIN TAKE 1 TABLET THREE TIMES A DAY AS NEEDED What changed:  See the new instructions.   furosemide 40 MG tablet Commonly known as:  LASIX TAKE 1 TABLET BY MOUTH EVERY DAY   gabapentin 100 MG capsule Commonly known as:  NEURONTIN Take 1 capsule (100 mg total) by mouth 3 (three) times daily.   meloxicam 15 MG tablet Commonly known as:  MOBIC TAKE 1 TABLET (15 MG TOTAL) BY MOUTH DAILY.   multivitamin tablet Take 1 tablet by mouth daily. Vitamin supplement.   omeprazole 40 MG capsule Commonly known as:  PRILOSEC Take 1 capsule (40 mg total) by mouth daily.   sertraline 100 MG tablet Commonly known as:  ZOLOFT TAKE 1 TABLET (100 MG TOTAL) BY MOUTH DAILY.   telmisartan-hydrochlorothiazide 80-12.5 MG tablet Commonly known as:  MICARDIS HCT TAKE 1 TABLET BY MOUTH DAILY.       Activity: activity as tolerated Diet: Liquid pured diet for 2 weeks Wound Care: none needed  Follow-up:  With Dr. Johna Sheriff in 3 weeks.  Signed: Mariella Saa MD, FACS  08/06/2016, 12:24 PM

## 2016-08-06 NOTE — Progress Notes (Signed)
4 Days Post-Op  Subjective: Feels better.  No pain.  Vomited small amt yesterday. Denies nausea but has not taken PO yet this AM.  Denies SOB  Objective: Vital signs in last 24 hours: Temp:  [97.9 F (36.6 C)-98.6 F (37 C)] 97.9 F (36.6 C) (10/03 0557) Pulse Rate:  [85-91] 85 (10/03 0557) Resp:  [18] 18 (10/03 0557) BP: (172-185)/(77-92) 185/92 (10/03 0557) SpO2:  [79 %-100 %] 99 % (10/03 0557) Last BM Date: 08/02/16  Intake/Output from previous day: 10/02 0701 - 10/03 0700 In: 1401.7 [P.O.:900; I.V.:501.7] Out: 2600 [Urine:2600] Intake/Output this shift: Total I/O In: -  Out: 350 [Urine:350]  General appearance: alert, cooperative and no distress Resp: clear to auscultation bilaterally GI: normal findings: soft, non-tender and non distended Incision/Wound: Clean and dry  Lab Results:   Recent Labs  08/04/16 0427 08/05/16 0427  WBC 11.7* 10.8*  HGB 8.8* 9.3*  HCT 28.7* 30.3*  PLT 160 193   BMET  Recent Labs  08/04/16 0427  NA 140  K 4.3  CL 107  CO2 27  GLUCOSE 123*  BUN 9  CREATININE 0.58  CALCIUM 8.2*     Studies/Results: Dg Abd Acute W/chest  Result Date: 08/05/2016 CLINICAL DATA:  Status post hiatal hernia surgery with nausea and vomiting EXAM: DG ABDOMEN ACUTE W/ 1V CHEST COMPARISON:  Chest radiograph November 08, 2011; abdominal radiographs November 08, 2011 FINDINGS: PA chest: There is patchy bibasilar atelectasis with small left pleural effusion. No edema or consolidation. Heart is upper normal in size with pulmonary vascularity within normal limits. No adenopathy. Supine and upright abdomen: Postoperative changes noted in the lumbar spine. There is thoracolumbar dextroscoliosis. There is diffuse stool throughout the colon. No bowel dilatation or air-fluid level to suggest bowel obstruction. No free air. IMPRESSION: Bowel gas pattern unremarkable. No bowel obstruction or free air. Diffuse stool throughout colon. Bibasilar atelectasis with small left  pleural effusion noted. No frank edema or consolidation evident. Electronically Signed   By: Bretta Bang III M.D.   On: 08/05/2016 12:07    Anti-infectives: Anti-infectives    Start     Dose/Rate Route Frequency Ordered Stop   08/02/16 0557  ceFAZolin (ANCEF) IVPB 2g/100 mL premix     2 g 200 mL/hr over 30 Minutes Intravenous On call to O.R. 08/02/16 0557 08/02/16 0754      Assessment/Plan: s/p Procedure(s): LAPAROSCOPIC REPAIR OF LARGE  HIATAL HERNIA LAPAROSCOPIC NISSEN FUNDOPLICATION Stable /improving Post op ileus, seems to be improving Hypoxia - appears secondary to atelectasis Ambulate and pulm toilet.  Poss home later today.   LOS: 4 days    Danajah Birdsell T 10/3/2017Patient ID: Julie Rice, female   DOB: [birth year], 80 y.o.   MRN: [id number]

## 2016-08-07 ENCOUNTER — Other Ambulatory Visit: Payer: Self-pay | Admitting: Internal Medicine

## 2016-08-08 ENCOUNTER — Other Ambulatory Visit: Payer: Self-pay | Admitting: Internal Medicine

## 2016-08-09 NOTE — Telephone Encounter (Signed)
Medication clonazepam already perscribed

## 2016-09-02 ENCOUNTER — Encounter: Payer: Self-pay | Admitting: Internal Medicine

## 2016-09-02 ENCOUNTER — Ambulatory Visit (INDEPENDENT_AMBULATORY_CARE_PROVIDER_SITE_OTHER): Payer: Medicare Other | Admitting: Internal Medicine

## 2016-09-02 VITALS — BP 126/80 | HR 74 | Ht 62.0 in | Wt 160.1 lb

## 2016-09-02 DIAGNOSIS — K222 Esophageal obstruction: Secondary | ICD-10-CM | POA: Diagnosis not present

## 2016-09-02 DIAGNOSIS — K219 Gastro-esophageal reflux disease without esophagitis: Secondary | ICD-10-CM

## 2016-09-02 NOTE — Patient Instructions (Addendum)
Please follow up in six months

## 2016-09-02 NOTE — Progress Notes (Signed)
HISTORY OF PRESENT ILLNESS:  Julie Rice is a 80 y.o. female with past medical history as listed below. Followed in this office for GERD, complicated by peptic stricture. Was having problems with significant reflux and chest pain despite medication. Large hiatal hernia. Underwent laparoscopic repair of her hiatal hernia with Nissen fundoplication 4 weeks ago. Overall doing better. No further problems with regurgitation or chest pain. Some dysphagia with solids. Mild gas bloat and mild incisional tenderness. Overall please. Has not tried to come off PPI. Last EGD April 2017  REVIEW OF SYSTEMS:  All non-GI ROS negative except for anxiety, arthritis  Past Medical History:  Diagnosis Date  . Anemia   . Anxiety   . Arthritis    ra and oa  . Bipolar disorder (HCC)   . Blood transfusion 1991   autologous pts own blood given   . CAD (coronary artery disease)   . Chest pain    "@ rest, lying down, w/exertion"  . COPD (chronic obstructive pulmonary disease) (HCC)   . Depression   . Dyspnea   . Esophageal stricture   . Fecal occult blood test positive   . Fibromyalgia   . GERD (gastroesophageal reflux disease)   . Hiatal hernia   . Hyperlipidemia   . Hypertension   . Internal hemorrhoids   . Lumbago   . Mitral regurgitation   . PONV (postoperative nausea and vomiting)    severe ponv, did ok with last surgery  . Rectal bleeding   . SOB (shortness of breath)    with exertion  . Spondylosis   . TIA (transient ischemic attack) 2013  . Urinary incontinence    wears depends    Past Surgical History:  Procedure Laterality Date  . ABDOMINAL HYSTERECTOMY  1982  . BLADDER SUSPENSION  1980's  . CATARACT EXTRACTION W/ INTRAOCULAR LENS  IMPLANT, BILATERAL Bilateral 2010  . DILATION AND CURETTAGE OF UTERUS  1961  . HIATAL HERNIA REPAIR N/A 08/02/2016   Procedure: LAPAROSCOPIC REPAIR OF LARGE  HIATAL HERNIA;  Surgeon: Glenna Fellows, MD;  Location: [facility];  Service: General;   Laterality: N/A;  . LAPAROSCOPIC NISSEN FUNDOPLICATION N/A 08/02/2016   Procedure: LAPAROSCOPIC NISSEN FUNDOPLICATION;  Surgeon: Glenna Fellows, MD;  Location: [facility];  Service: General;  Laterality: N/A;  . LUMBAR LAMINECTOMY  1990; 1994; 3536;1443   "I've got 2 stainless steel rods; 6 screws; 2 ray cages"took bone from right hip to put in back  . TONSILLECTOMY AND ADENOIDECTOMY  1945  . TOTAL KNEE ARTHROPLASTY Right ~ 1996  . TUBAL LIGATION  ~ 1976    Social History Julie Rice  reports that she has never smoked. She has never used smokeless tobacco. She reports that she does not drink alcohol or use drugs.  family history includes Heart disease (age of onset: 3) in her father; Hypertension in her mother; Kidney disease in her mother; Suicidality in her son.  Allergies  Allergen Reactions  . Metaxalone   . Morphine     Flushing, rash, itching  . Tape Other (See Comments)    Band aides, adhesive tape Redness and pulls skin off       PHYSICAL EXAMINATION: Vital signs: BP 126/80   Pulse 74   Ht 5\' 2"  (1.575 m)   Wt 160 lb 2 oz (72.6 kg)   BMI 29.29 kg/m   Constitutional: generally well-appearing, no acute distress Psychiatric: alert and oriented x3, cooperative Eyes: extraocular movements intact, anicteric, conjunctiva pink Mouth: oral pharynx moist,  no lesions Neck: supple no lymphadenopathy Cardiovascular: heart regular rate and rhythm, no murmur Lungs: clear to auscultation bilaterally Abdomen: soft, nontender, nondistended, no obvious ascites, no peritoneal signs, normal bowel sounds, no organomegaly. Laparoscopic incisions well-healed Rectal:Omitted Extremities: no clubbing cyanosis or lower extremity edema bilaterally Skin: no lesions on visible extremities Neuro: No focal deficits. Cranial nerves intact  ASSESSMENT:  #1. GERD status post repair of large hiatal hernia and laparoscopic Nissen fundoplication. Doing well  PLAN:  #1. Reflux  precautions #2. Try to come off PPI. If doing well off PPI than stay off PPI. If significant reflux symptoms occur, then resume #3. GI follow-up 6 months.  15 minutes spent face-to-face with the patient. The 50% a time use for counseling regarding her GERD, recent surgery, and medication recommendations

## 2016-09-30 ENCOUNTER — Other Ambulatory Visit (HOSPITAL_COMMUNITY): Payer: Self-pay | Admitting: General Surgery

## 2016-09-30 DIAGNOSIS — R1310 Dysphagia, unspecified: Secondary | ICD-10-CM

## 2016-10-03 ENCOUNTER — Ambulatory Visit (HOSPITAL_COMMUNITY)
Admission: RE | Admit: 2016-10-03 | Discharge: 2016-10-03 | Disposition: A | Discharge status: Home / Self Care | Payer: Medicare Other | Source: Ambulatory Visit | Attending: General Surgery | Admitting: General Surgery

## 2016-10-03 DIAGNOSIS — R1310 Dysphagia, unspecified: Secondary | ICD-10-CM | POA: Diagnosis not present

## 2016-10-03 DIAGNOSIS — K224 Dyskinesia of esophagus: Secondary | ICD-10-CM | POA: Diagnosis not present

## 2016-10-14 ENCOUNTER — Other Ambulatory Visit: Payer: Self-pay | Admitting: Internal Medicine

## 2016-10-14 ENCOUNTER — Encounter: Payer: Self-pay | Admitting: Internal Medicine

## 2016-10-14 MED ORDER — BENZONATATE 100 MG PO CAPS: 100.0000 mg | ORAL_CAPSULE | Freq: Two times a day (BID) | ORAL | 0 refills | Status: DC | PRN

## 2016-10-15 ENCOUNTER — Telehealth: Payer: Self-pay | Admitting: Internal Medicine

## 2016-10-15 NOTE — Telephone Encounter (Signed)
Patient Name: Julie Rice  DOB: November 02, 1936    Initial Comment Caller says, sore throat and cough which started on Sunday. low grade fever 99.3-99.6    Nurse Assessment  Nurse: Renaldo Fiddler, RN, Raynelle Fanning Date/Time [name] Time): 10/15/2016 10:58:39 AM  Confirm and document reason for call. If symptomatic, describe symptoms. ---Caller states she has had a sore throat and cough which started over the weekend. Temp has been running 99.3-99.6, orally.  Does the patient have any new or worsening symptoms? ---Yes  Will a triage be completed? ---Yes  Related visit to physician within the last 2 weeks? ---No  Does the PT have any chronic conditions? (i.e. diabetes, asthma, etc.) ---Yes  List chronic conditions. ---Htn, Arthritis  Is this a behavioral health or substance abuse call? ---No     Guidelines    Guideline Title Affirmed Question Affirmed Notes  Cough - Acute Non-Productive SEVERE coughing spells (e.g., whooping sound after coughing, vomiting after coughing)    Final Disposition User   See Physician within 24 Hours Renaldo Fiddler, RN, Raynelle Fanning    Referrals  REFERRED TO PCP OFFICE   Disagree/Comply: Danella Maiers

## 2016-10-15 NOTE — Telephone Encounter (Signed)
Noted. Appointment scheduled tomorrow with Dr. Caryl Never.

## 2016-10-16 ENCOUNTER — Ambulatory Visit (INDEPENDENT_AMBULATORY_CARE_PROVIDER_SITE_OTHER): Payer: Medicare Other | Admitting: Family Medicine

## 2016-10-16 VITALS — BP 160/80 | HR 100 | Temp 97.9°F | Ht 62.0 in | Wt 160.0 lb

## 2016-10-16 DIAGNOSIS — J069 Acute upper respiratory infection, unspecified: Secondary | ICD-10-CM | POA: Diagnosis not present

## 2016-10-16 DIAGNOSIS — B9789 Other viral agents as the cause of diseases classified elsewhere: Secondary | ICD-10-CM

## 2016-10-16 NOTE — Progress Notes (Signed)
Pre visit review using our clinic review tool, if applicable. No additional management support is needed unless otherwise documented below in the visit note. 

## 2016-10-16 NOTE — Progress Notes (Signed)
Subjective:     Patient ID: Julie Rice, female   DOB: [birth year], 80 y.o.   MRN: [id number]  HPI   Patient is nonsmoker seen for acute visit. She had onset a few days ago of low-grade fever of 100.8 with chills and sore throat and dry cough. Denies any myalgias or body aches. Fevers down today. Increased malaise. Cough has been most bothersome symptom. She has tried some over-the-counter Mucinex which has not helped much. Prescription for Tessalon already sent and she has not yet gotten this. Reported history of allergy to morphine  Past Medical History:  Diagnosis Date  . Anemia   . Anxiety   . Arthritis    ra and oa  . Bipolar disorder (HCC)   . Blood transfusion 1991   autologous pts own blood given   . CAD (coronary artery disease)   . Chest pain    "@ rest, lying down, w/exertion"  . COPD (chronic obstructive pulmonary disease) (HCC)   . Depression   . Dyspnea   . Esophageal stricture   . Fecal occult blood test positive   . Fibromyalgia   . GERD (gastroesophageal reflux disease)   . Hiatal hernia   . Hyperlipidemia   . Hypertension   . Internal hemorrhoids   . Lumbago   . Mitral regurgitation   . PONV (postoperative nausea and vomiting)    severe ponv, did ok with last surgery  . Rectal bleeding   . SOB (shortness of breath)    with exertion  . Spondylosis   . TIA (transient ischemic attack) 2013  . Urinary incontinence    wears depends   Past Surgical History:  Procedure Laterality Date  . ABDOMINAL HYSTERECTOMY  1982  . BLADDER SUSPENSION  1980's  . CATARACT EXTRACTION W/ INTRAOCULAR LENS  IMPLANT, BILATERAL Bilateral 2010  . DILATION AND CURETTAGE OF UTERUS  1961  . HIATAL HERNIA REPAIR N/A 08/02/2016   Procedure: LAPAROSCOPIC REPAIR OF LARGE  HIATAL HERNIA;  Surgeon: Glenna Fellows, MD;  Location: [facility];  Service: General;  Laterality: N/A;  . LAPAROSCOPIC NISSEN FUNDOPLICATION N/A 08/02/2016   Procedure: LAPAROSCOPIC NISSEN FUNDOPLICATION;  Surgeon:  Glenna Fellows, MD;  Location: [facility];  Service: General;  Laterality: N/A;  . LUMBAR LAMINECTOMY  1990; 1994; 8413;2440   "I've got 2 stainless steel rods; 6 screws; 2 ray cages"took bone from right hip to put in back  . TONSILLECTOMY AND ADENOIDECTOMY  1945  . TOTAL KNEE ARTHROPLASTY Right ~ 1996  . TUBAL LIGATION  ~ 1976    reports that she has never smoked. She has never used smokeless tobacco. She reports that she does not drink alcohol or use drugs. family history includes Heart disease (age of onset: 53) in her father; Hypertension in her mother; Kidney disease in her mother; Suicidality in her son. Allergies  Allergen Reactions  . Metaxalone   . Morphine     Flushing, rash, itching  . Tape Other (See Comments)    Band aides, adhesive tape Redness and pulls skin off     Review of Systems  Constitutional: Positive for chills, fatigue and fever.  HENT: Positive for congestion and sore throat.   Respiratory: Positive for cough.   Gastrointestinal: Negative for nausea and vomiting.       Objective:   Physical Exam  Constitutional: She appears well-developed and well-nourished.  HENT:  Mouth/Throat: Oropharynx is clear and moist.  Right eardrum is normal. She has some cerumen in the left ear canal  Neck: Neck supple.  Cardiovascular: Normal rate and regular rhythm.   Pulmonary/Chest: Effort normal and breath sounds normal. No respiratory distress. She has no wheezes. She has no rales.       Assessment:     URI with cough. Suspect viral. Currently afebrile and in no respiratory distress    Plan:     -Tessalon Perles 100 mg every 8 hours as needed for cough -Stay well-hydrated -Follow-up for increasing fevers or increased shortness of breath or other new symptoms  Kristian Covey MD [facility]

## 2016-10-16 NOTE — Patient Instructions (Signed)
Try the Tessalon for your cough Follow up for any persistent fever or increased shortness of breath.

## 2016-10-22 ENCOUNTER — Other Ambulatory Visit: Payer: Self-pay | Admitting: Internal Medicine

## 2016-10-22 ENCOUNTER — Encounter: Payer: Self-pay | Admitting: Internal Medicine

## 2016-10-22 MED ORDER — BENZONATATE 100 MG PO CAPS: ORAL_CAPSULE | ORAL | 2 refills | Status: DC

## 2016-10-30 ENCOUNTER — Encounter: Payer: Self-pay | Admitting: Internal Medicine

## 2016-11-03 ENCOUNTER — Other Ambulatory Visit: Payer: Self-pay | Admitting: Internal Medicine

## 2016-11-04 ENCOUNTER — Encounter: Payer: Self-pay | Admitting: Internal Medicine

## 2016-11-05 NOTE — Telephone Encounter (Signed)
Julie Rice, please call pt and schedule appointment tomorrow with another provider due to Dr.K does not have any appointments. Thanks.

## 2016-11-06 ENCOUNTER — Other Ambulatory Visit: Payer: Self-pay | Admitting: Internal Medicine

## 2016-12-28 ENCOUNTER — Other Ambulatory Visit: Payer: Self-pay | Admitting: Internal Medicine

## 2017-01-09 ENCOUNTER — Encounter: Payer: Self-pay | Admitting: Internal Medicine

## 2017-01-09 ENCOUNTER — Ambulatory Visit (INDEPENDENT_AMBULATORY_CARE_PROVIDER_SITE_OTHER): Payer: Medicare Other | Admitting: Internal Medicine

## 2017-01-09 VITALS — BP 122/60 | HR 76 | Ht 62.0 in | Wt 165.4 lb

## 2017-01-09 DIAGNOSIS — K219 Gastro-esophageal reflux disease without esophagitis: Secondary | ICD-10-CM

## 2017-01-09 DIAGNOSIS — D649 Anemia, unspecified: Secondary | ICD-10-CM | POA: Diagnosis not present

## 2017-01-09 DIAGNOSIS — R1310 Dysphagia, unspecified: Secondary | ICD-10-CM | POA: Diagnosis not present

## 2017-01-09 NOTE — Patient Instructions (Signed)
You have been scheduled for an endoscopy. Please follow written instructions given to you at your visit today. If you use inhalers (even only as needed), please bring them with you on the day of your procedure. Your physician has requested that you go to www.startemmi.com and enter the access code given to you at your visit today. This web site gives a general overview about your procedure. However, you should still follow specific instructions given to you by our office regarding your preparation for the procedure.  If you are age 69 or older, your body mass index should be between 23-30. Your Body mass index is 30.25 kg/m. If this is out of the aforementioned range listed, please consider follow up with your Primary Care Provider.  If you are age 63 or younger, your body mass index should be between 19-25. Your Body mass index is 30.25 kg/m. If this is out of the aformentioned range listed, please consider follow up with your Primary Care Provider.

## 2017-01-09 NOTE — Progress Notes (Signed)
HISTORY OF PRESENT ILLNESS:  Julie Rice is a 81 y.o. female with a history of GERD, peptic stricture, and significant problems with chest pain and vomiting secondary to large hiatal hernia for which she underwent Nissen fundoplication and repair of large hiatal hernia last fall. I saw her approximately 4 weeks later on 09/02/2016. See that dictation. At that time she was doing well except for mild gas bloat, mild incisional tenderness, and mild dysphagia with solids. She did try to come off PPI is recommended but experienced significant reflux symptoms. As such, she is on omeprazole 40 mg daily. Her chief complaint today is that of worsening dysphagia to solids and occasionally liquids. She did mention this to Dr. Johna Sheriff who obtained a barium esophagram 10/03/2016. There is no obvious anatomic abnormality that she was said to have significant dysmotility. Some delay in passage of the 13 mm barium tablet which eventually went through the wrap. She is accompanied today by her friend. She does have mild gas bloat. She has had to regurgitate food on 1 occasion. No other complaints. Prior problems with spontaneous regurgitation and chest pain have resolved postoperatively. Outside blood work shows anemia with hemoglobin of 9.3. MCV 91.5. Hemoglobin February 2017 was 11.8. Last colonoscopy August 2008 was normal.  REVIEW OF SYSTEMS:  All non-GI ROS negative upon comprehensive review  Past Medical History:  Diagnosis Date  . Anemia   . Anxiety   . Arthritis    ra and oa  . Bipolar disorder (HCC)   . Blood transfusion 1991   autologous pts own blood given   . CAD (coronary artery disease)   . Chest pain    "@ rest, lying down, w/exertion"  . COPD (chronic obstructive pulmonary disease) (HCC)   . Depression   . Dyspnea   . Esophageal stricture   . Fecal occult blood test positive   . Fibromyalgia   . GERD (gastroesophageal reflux disease)   . Hiatal hernia   . Hyperlipidemia   .  Hypertension   . Internal hemorrhoids   . Lumbago   . Mitral regurgitation   . PONV (postoperative nausea and vomiting)    severe ponv, did ok with last surgery  . Rectal bleeding   . SOB (shortness of breath)    with exertion  . Spondylosis   . TIA (transient ischemic attack) 2013  . Urinary incontinence    wears depends    Past Surgical History:  Procedure Laterality Date  . ABDOMINAL HYSTERECTOMY  1982  . BLADDER SUSPENSION  1980's  . CATARACT EXTRACTION W/ INTRAOCULAR LENS  IMPLANT, BILATERAL Bilateral 2010  . DILATION AND CURETTAGE OF UTERUS  1961  . HIATAL HERNIA REPAIR N/A 08/02/2016   Procedure: LAPAROSCOPIC REPAIR OF LARGE  HIATAL HERNIA;  Surgeon: Glenna Fellows, MD;  Location: [facility];  Service: General;  Laterality: N/A;  . LAPAROSCOPIC NISSEN FUNDOPLICATION N/A 08/02/2016   Procedure: LAPAROSCOPIC NISSEN FUNDOPLICATION;  Surgeon: Glenna Fellows, MD;  Location: [facility];  Service: General;  Laterality: N/A;  . LUMBAR LAMINECTOMY  1990; 1994; [id number];6803   "I've got 2 stainless steel rods; 6 screws; 2 ray cages"took bone from right hip to put in back  . TONSILLECTOMY AND ADENOIDECTOMY  1945  . TOTAL KNEE ARTHROPLASTY Right ~ 1996  . TUBAL LIGATION  ~ 1976    Social History Julie Rice  reports that she has never smoked. She has never used smokeless tobacco. She reports that she does not drink alcohol or use drugs.  family history includes Heart disease (age of onset: 22) in her father; Hypertension in her mother; Kidney disease in her mother; Suicidality in her son.  Allergies  Allergen Reactions  . Metaxalone   . Morphine     Flushing, rash, itching  . Tape Other (See Comments)    Band aides, adhesive tape Redness and pulls skin off       PHYSICAL EXAMINATION: Vital signs: BP 122/60   Pulse 76   Ht 5\' 2"  (1.575 m)   Wt 165 lb 6.4 oz (75 kg)   BMI 30.25 kg/m   Constitutional: generally well-appearing, no acute distress Psychiatric: alert and  oriented x3, cooperative Eyes: extraocular movements intact, anicteric, conjunctiva pink Mouth: oral pharynx moist, no lesions Neck: supple no lymphadenopathy Cardiovascular: heart regular rate and rhythm, no murmur Lungs: clear to auscultation bilaterally Abdomen: soft, nontender, nondistended, no obvious ascites, no peritoneal signs, normal bowel sounds, no organomegaly Rectal: Omitted Extremities: no clubbing cyanosis or lower extremity edema bilaterally Skin: no lesions on visible extremities Neuro: No focal deficits. Cranial nerves intact  ASSESSMENT:  1. Dysphagia as described. Suspect may be secondary to slightly tight wrap and/or concurrent dysmotility (primary or secondary) 2. GERD complicated by large hiatal hernia status post Nissen fundoplication and repair of large hiatal hernia September 2017 3. Progressive normocytic anemia  PLAN:  1. Schedule upper endoscopy with probable esophageal dilation.The nature of the procedure, as well as the risks, benefits, and alternatives were carefully and thoroughly reviewed with the patient. Ample time for discussion and questions allowed. The patient understood, was satisfied, and agreed to proceed. 2. Continue PPI. Lowest dose to control reflux symptoms 3. Anemia panel (nurse to call patient and arrange)

## 2017-01-10 ENCOUNTER — Telehealth: Payer: Self-pay

## 2017-01-10 ENCOUNTER — Other Ambulatory Visit: Payer: Self-pay

## 2017-01-10 ENCOUNTER — Other Ambulatory Visit (INDEPENDENT_AMBULATORY_CARE_PROVIDER_SITE_OTHER): Payer: Medicare Other

## 2017-01-10 DIAGNOSIS — D649 Anemia, unspecified: Secondary | ICD-10-CM

## 2017-01-10 LAB — IBC PANEL
IRON: 40 ug/dL — AB (ref 42–145)
SATURATION RATIOS: 9.1 % — AB (ref 20.0–50.0)
TRANSFERRIN: 315 mg/dL (ref 212.0–360.0)

## 2017-01-10 LAB — FERRITIN: Ferritin: 17.7 ng/mL (ref 10.0–291.0)

## 2017-01-10 LAB — FOLATE: Folate: >23.5 ng/mL (ref >5.9)

## 2017-01-10 LAB — VITAMIN B12: Vitamin B-12: 647 pg/mL (ref 211–911)

## 2017-01-10 MED ORDER — FERROUS SULFATE 325 (65 FE) MG PO TABS: 325.0000 mg | ORAL_TABLET | Freq: Two times a day (BID) | ORAL | 3 refills | Status: DC

## 2017-01-10 NOTE — Telephone Encounter (Signed)
-----   Message from Hilarie Fredrickson, MD sent at 01/09/2017  4:33 PM EST ----- Regarding: Needs anemia workup Julie Rice, Patient saw me today for dysphagia. After she left and noticed that she was anemic (not related to her office visit but I feel it needs evaluated). I am not even sure that she is aware. Please have her come in for B12 level, folate, iron saturation, and ferritin. Thanks

## 2017-01-10 NOTE — Telephone Encounter (Signed)
Spoke with pt and she is aware and will come in for labs, orders in epic.

## 2017-01-26 ENCOUNTER — Other Ambulatory Visit: Payer: Self-pay | Admitting: Internal Medicine

## 2017-01-31 ENCOUNTER — Other Ambulatory Visit: Payer: Self-pay | Admitting: Internal Medicine

## 2017-02-06 ENCOUNTER — Encounter: Payer: Self-pay | Admitting: Internal Medicine

## 2017-02-08 ENCOUNTER — Other Ambulatory Visit: Payer: Self-pay | Admitting: Internal Medicine

## 2017-02-14 ENCOUNTER — Encounter: Payer: Self-pay | Admitting: Internal Medicine

## 2017-02-14 ENCOUNTER — Ambulatory Visit (INDEPENDENT_AMBULATORY_CARE_PROVIDER_SITE_OTHER): Payer: Medicare Other | Admitting: Internal Medicine

## 2017-02-14 VITALS — BP 142/82 | HR 86 | Temp 98.2°F | Ht 62.0 in | Wt 163.4 lb

## 2017-02-14 DIAGNOSIS — F411 Generalized anxiety disorder: Secondary | ICD-10-CM

## 2017-02-14 DIAGNOSIS — E785 Hyperlipidemia, unspecified: Secondary | ICD-10-CM | POA: Diagnosis not present

## 2017-02-14 DIAGNOSIS — E039 Hypothyroidism, unspecified: Secondary | ICD-10-CM | POA: Diagnosis not present

## 2017-02-14 DIAGNOSIS — I10 Essential (primary) hypertension: Secondary | ICD-10-CM

## 2017-02-14 MED ORDER — LOSARTAN POTASSIUM-HCTZ 100-25 MG PO TABS: 1.0000 | ORAL_TABLET | Freq: Every day | ORAL | 3 refills | Status: DC

## 2017-02-14 NOTE — Patient Instructions (Signed)
Limit your sodium (Salt) intake  Please check your blood pressure on a regular basis.  If it is consistently greater than 150/90, please make an office appointment.  Return in 3 months for your annual exam

## 2017-02-14 NOTE — Progress Notes (Signed)
Subjective:    Patient ID: Julie Rice, female    DOB: 17-May-1936, 81 y.o.   MRN: [id number]  HPI  81 year old patient who has essential hypertension.  Her plan no longer covers her  Present antihypertensive Complaints today include fatigue and pedal edema. She also describes a pins and needles sensation in the lower legs. In October, she underwent knee some fundoplication.  Medical regimen does include when necessary furosemide.  She has a history of chronic  Anxiety disorder.  Past Medical History:  Diagnosis Date  . Anemia   . Anxiety   . Arthritis    ra and oa  . Bipolar disorder (HCC)   . Blood transfusion 1991   autologous pts own blood given   . CAD (coronary artery disease)   . Chest pain    "@ rest, lying down, w/exertion"  . COPD (chronic obstructive pulmonary disease) (HCC)   . Depression   . Dyspnea   . Esophageal stricture   . Fecal occult blood test positive   . Fibromyalgia   . GERD (gastroesophageal reflux disease)   . Hiatal hernia   . Hyperlipidemia   . Hypertension   . Internal hemorrhoids   . Lumbago   . Mitral regurgitation   . PONV (postoperative nausea and vomiting)    severe ponv, did ok with last surgery  . Rectal bleeding   . SOB (shortness of breath)    with exertion  . Spondylosis   . TIA (transient ischemic attack) 2013  . Urinary incontinence    wears depends     Social History   Social History  . Marital status: Married    Spouse name: N/A  . Number of children: 2 D  . Years of education: N/A   Occupational History  .  Retired   Social History Main Topics  . Smoking status: Never Smoker  . Smokeless tobacco: Never Used  . Alcohol use No  . Drug use: No  . Sexual activity: Not Currently   Other Topics Concern  . Not on file   Social History Narrative  . No narrative on file    Past Surgical History:  Procedure Laterality Date  . ABDOMINAL HYSTERECTOMY  1982  . BLADDER SUSPENSION  1980's  . CATARACT  EXTRACTION W/ INTRAOCULAR LENS  IMPLANT, BILATERAL Bilateral 2010  . DILATION AND CURETTAGE OF UTERUS  1961  . HIATAL HERNIA REPAIR N/A 08/02/2016   Procedure: LAPAROSCOPIC REPAIR OF LARGE  HIATAL HERNIA;  Surgeon: Glenna Fellows, MD;  Location: [facility];  Service: General;  Laterality: N/A;  . LAPAROSCOPIC NISSEN FUNDOPLICATION N/A 08/02/2016   Procedure: LAPAROSCOPIC NISSEN FUNDOPLICATION;  Surgeon: Glenna Fellows, MD;  Location: [facility];  Service: General;  Laterality: N/A;  . LUMBAR LAMINECTOMY  1990; 1994; [id number]   "I've got 2 stainless steel rods; 6 screws; 2 ray cages"took bone from right hip to put in back  . TONSILLECTOMY AND ADENOIDECTOMY  1945  . TOTAL KNEE ARTHROPLASTY Right ~ 1996  . TUBAL LIGATION  ~ 1976    Family History  Problem Relation Age of Onset  . Kidney disease Mother   . Hypertension Mother   . Heart disease Father 35    MI  . Suicidality Son     Allergies  Allergen Reactions  . Metaxalone   . Morphine     Flushing, rash, itching  . Tape Other (See Comments)    Band aides, adhesive tape Redness and pulls skin off    Current  Outpatient Prescriptions on File Prior to Visit  Medication Sig Dispense Refill  . aspirin 81 MG tablet Take 1 tablet (81 mg total) by mouth daily. For cardiovascular health.    . clonazePAM (KLONOPIN) 0.5 MG tablet TAKE 1 TABLT 3 TIMES A DAY AS NEEDED 270 tablet 0  . furosemide (LASIX) 40 MG tablet TAKE 1 TABLET BY MOUTH EVERY DAY 90 tablet 1  . gabapentin (NEURONTIN) 100 MG capsule TAKE 1 CAPSULE (100 MG TOTAL) BY MOUTH 3 (THREE) TIMES DAILY. 280 capsule 0  . meloxicam (MOBIC) 15 MG tablet TAKE 1 TABLET BY MOUTH EVERY DAY 90 tablet 0  . Multiple Vitamin (MULTIVITAMIN) tablet Take 1 tablet by mouth daily. Vitamin supplement.    Marland Kitchen omeprazole (PRILOSEC) 40 MG capsule TAKE 1 CAPSULE (40 MG TOTAL) BY MOUTH DAILY. 90 capsule 1  . sertraline (ZOLOFT) 100 MG tablet TAKE 1 TABLET BY MOUTH EVERY DAY 90 tablet 0  . ferrous sulfate 325  (65 FE) MG tablet Take 1 tablet (325 mg total) by mouth 2 (two) times daily with a meal. (Patient not taking: Reported on 02/14/2017) 60 tablet 3  . telmisartan-hydrochlorothiazide (MICARDIS HCT) 80-12.5 MG tablet TAKE 1 TABLET BY MOUTH DAILY. (Patient not taking: Reported on 02/14/2017) 90 tablet 1   No current facility-administered medications on file prior to visit.     BP (!) 142/82 (BP Location: Left Arm, Patient Position: Sitting, Cuff Size: Normal)   Pulse 86   Temp 98.2 F (36.8 C) (Oral)   Ht 5\' 2"  (1.575 m)   Wt 163 lb 6.4 oz (74.1 kg)   SpO2 98%   BMI 29.89 kg/m     Review of Systems  Constitutional: Positive for fatigue.  HENT: Negative for congestion, dental problem, hearing loss, rhinorrhea, sinus pressure, sore throat and tinnitus.   Eyes: Negative for pain, discharge and visual disturbance.  Respiratory: Positive for shortness of breath. Negative for cough.   Cardiovascular: Positive for leg swelling. Negative for chest pain and palpitations.  Gastrointestinal: Positive for abdominal pain. Negative for abdominal distention, blood in stool, constipation, diarrhea, nausea and vomiting.  Genitourinary: Negative for difficulty urinating, dysuria, flank pain, frequency, hematuria, pelvic pain, urgency, vaginal bleeding, vaginal discharge and vaginal pain.  Musculoskeletal: Negative for arthralgias, gait problem and joint swelling.  Skin: Negative for rash.  Neurological: Positive for dizziness and weakness. Negative for syncope, speech difficulty, numbness and headaches.  Hematological: Negative for adenopathy.  Psychiatric/Behavioral: Negative for agitation, behavioral problems and dysphoric mood. The patient is not nervous/anxious.        Objective:   Physical Exam  Constitutional: She is oriented to person, place, and time. She appears well-developed and well-nourished. No distress.  HENT:  Head: Normocephalic.  Right Ear: External ear normal.  Left Ear: External  ear normal.  Mouth/Throat: Oropharynx is clear and moist.  Eyes: Conjunctivae and EOM are normal. Pupils are equal, round, and reactive to light.  Neck: Normal range of motion. Neck supple. No thyromegaly present.  Cardiovascular: Normal rate, regular rhythm, normal heart sounds and intact distal pulses.   Pulmonary/Chest: Effort normal and breath sounds normal.  Abdominal: Soft. Bowel sounds are normal. She exhibits no mass. There is no tenderness.  Musculoskeletal: Normal range of motion.  Perhaps trace pedal edema  Lymphadenopathy:    She has no cervical adenopathy.  Neurological: She is alert and oriented to person, place, and time.  Skin: Skin is warm and dry. No rash noted.  Psychiatric: She has a normal mood and affect.  Her behavior is normal.          Assessment & Plan:   Essential hypertension.  We'll switch to losartan HCT Anxiety disorder.  No change in therapy Status post Nissen fundoplication.  Follow-up GI and surgery Pedal edema.  Not clinically apparent today.  Low-salt diet recommended  Schedule CPX  Rogelia Boga

## 2017-02-14 NOTE — Progress Notes (Signed)
Pre visit review using our clinic review tool, if applicable. No additional management support is needed unless otherwise documented below in the visit note. 

## 2017-02-20 ENCOUNTER — Ambulatory Visit (AMBULATORY_SURGERY_CENTER): Payer: Medicare Other | Admitting: Internal Medicine

## 2017-02-20 ENCOUNTER — Encounter: Payer: Self-pay | Admitting: Internal Medicine

## 2017-02-20 VITALS — BP 152/78 | HR 80 | Temp 98.6°F | Resp 16 | Ht 62.0 in | Wt 165.0 lb

## 2017-02-20 DIAGNOSIS — K219 Gastro-esophageal reflux disease without esophagitis: Secondary | ICD-10-CM

## 2017-02-20 DIAGNOSIS — R1310 Dysphagia, unspecified: Secondary | ICD-10-CM

## 2017-02-20 DIAGNOSIS — I10 Essential (primary) hypertension: Secondary | ICD-10-CM | POA: Diagnosis not present

## 2017-02-20 DIAGNOSIS — K222 Esophageal obstruction: Secondary | ICD-10-CM | POA: Diagnosis not present

## 2017-02-20 DIAGNOSIS — Z8673 Personal history of transient ischemic attack (TIA), and cerebral infarction without residual deficits: Secondary | ICD-10-CM | POA: Diagnosis not present

## 2017-02-20 MED ORDER — SODIUM CHLORIDE 0.9 % IV SOLN: 500.0000 mL | INTRAVENOUS | Status: DC

## 2017-02-20 NOTE — Progress Notes (Signed)
Called to room to assist during endoscopic procedure.  Patient ID and intended procedure confirmed with present staff. Received instructions for my participation in the procedure from the performing physician.  

## 2017-02-20 NOTE — Patient Instructions (Signed)
YOU HAD AN ENDOSCOPIC PROCEDURE TODAY AT THE [facility]:   Refer to the procedure report that was given to you for any specific questions about what was found during the examination.  If the procedure report does not answer your questions, please call your gastroenterologist to clarify.  If you requested that your care partner not be given the details of your procedure findings, then the procedure report has been included in a sealed envelope for you to review at your convenience later.  YOU SHOULD EXPECT: Some feelings of bloating in the abdomen. Passage of more gas than usual.  Walking can help get rid of the air that was put into your GI tract during the procedure and reduce the bloating.  Please Note:  You might notice some irritation and congestion in your nose or some drainage.  This is from the oxygen used during your procedure.  There is no need for concern and it should clear up in a day or so.  SYMPTOMS TO REPORT IMMEDIATELY:   Following upper endoscopy (EGD)  Vomiting of blood or coffee ground material  New chest pain or pain under the shoulder blades  Painful or persistently difficult swallowing  New shortness of breath  Fever of 100F or higher  Black, tarry-looking stools  For urgent or emergent issues, a gastroenterologist can be reached at any hour by calling [phone].   DIET:  FOLLOW DILATION DIET-SEE HANDOUT  Drink plenty of fluids but you should avoid alcoholic beverages for 24 hours.  ACTIVITY:  You should plan to take it easy for the rest of today and you should NOT DRIVE or use heavy machinery until tomorrow (because of the sedation medicines used during the test).    FOLLOW UP: Our staff will call the number listed on your records the next business day following your procedure to check on you and address any questions or concerns that you may have regarding the information given to you following your procedure. If we do not reach you, we will leave  a message.  However, if you are feeling well and you are not experiencing any problems, there is no need to return our call.  We will assume that you have returned to your regular daily activities without incident.  SIGNATURES/CONFIDENTIALITY: You and/or your care partner have signed paperwork which will be entered into your electronic medical record.  These signatures attest to the fact that that the information above on your After Visit Summary has been reviewed and is understood.  Full responsibility of the confidentiality of this discharge information lies with you and/or your care-partner.  FOLLOW DILATION DIET TODAY  Please continue your normal medications  Please call Dr. [name] in the next week to set up an appointment in the office for 2 months

## 2017-02-20 NOTE — Op Note (Addendum)
[facility] Patient Name: Julie Rice Procedure Date: 02/20/2017 9:44 AM MRN: [id number] Endoscopist: Wilhemina Bonito. [name] , MD Age: 81 Referring MD:  Date of Birth: 11-30-1935 Gender: Female Account #: [id number] Procedure:                Upper GI endoscopy, with balloon dilation of the                            esophagus Indications:              Dysphagia. Status post fundoplication September 2017 Medicines:                Monitored Anesthesia Care Procedure:                Pre-Anesthesia Assessment:                           - Prior to the procedure, a History and Physical                            was performed, and patient medications and                            allergies were reviewed. The patient's tolerance of                            previous anesthesia was also reviewed. The risks                            and benefits of the procedure and the sedation                            options and risks were discussed with the patient.                            All questions were answered, and informed consent                            was obtained. Prior Anticoagulants: The patient has                            taken no previous anticoagulant or antiplatelet                            agents. ASA Grade Assessment: II - A patient with                            mild systemic disease. After reviewing the risks                            and benefits, the patient was deemed in                            satisfactory condition to undergo the procedure.  After obtaining informed consent, the endoscope was                            passed under direct vision. Throughout the                            procedure, the patient's blood pressure, pulse, and                            oxygen saturations were monitored continuously. The                            Model GIF-[id number]) scope was introduced                            through the  mouth, and advanced to the second part                            of duodenum. The upper GI endoscopy was                            accomplished without difficulty. The patient                            tolerated the procedure well. Scope In: Scope Out: Findings:                 One large caliber mild benign-appearing, intrinsic                            stenosis was found 36 cm from the incisors. A TTS                            dilator was passed through the scope. Dilation with                            an 18-19-20 mm balloon dilator was performed to 20                            mm. The ring diameter appeared larger than the 20                            mm balloon. No disruption.                           The exam of the esophagus was otherwise normal.                           The stomach revealed evidence of prior                            fundoplication with small hiatal hernia. The  stomach was otherwise normal.                           The examined duodenum was normal.                           The cardia and gastric fundus were normal on                            retroflexion, save changes of fundoplication. Complications:            No immediate complications. Estimated Blood Loss:     Estimated blood loss: none. Impression:               - Benign-appearing esophageal stenosis. Dilated.                           - Normal stomach status post fundoplication.                           - Normal examined duodenum.                           - No specimens collected. Recommendation:           - Patient has a contact number available for                            emergencies. The signs and symptoms of potential                            delayed complications were discussed with the                            patient. Return to normal activities tomorrow.                            Written discharge instructions were provided to the                             patient.                           - Resume previous diet.                           - Continue present medications.                           - Follow-up with Dr. [name] in 2 months Wilhemina Bonito. [name], MD 02/20/2017 10:19:39 AM This report has been signed electronically.

## 2017-02-20 NOTE — Progress Notes (Signed)
Report to PACU, RN, vss, BBS= Clear.  

## 2017-02-21 ENCOUNTER — Telehealth: Payer: Self-pay

## 2017-02-21 NOTE — Telephone Encounter (Signed)
  Follow up Call-  Call back number 02/20/2017 02/23/2016  Post procedure Call Back phone  # [phone]  Permission to leave phone message Yes Yes  Some recent data might be hidden     Patient questions:  Do you have a fever, pain , or abdominal swelling? No. Pain Score  0 *  Have you tolerated food without any problems? Yes.    Have you been able to return to your normal activities? Yes.    Do you have any questions about your discharge instructions: Diet   No. Medications  No. Follow up visit  No.  Do you have questions or concerns about your Care? No.  Actions: * If pain score is 4 or above: No action needed, pain <4.

## 2017-02-27 ENCOUNTER — Encounter: Payer: Self-pay | Admitting: Internal Medicine

## 2017-03-29 ENCOUNTER — Other Ambulatory Visit: Payer: Self-pay | Admitting: Internal Medicine

## 2017-03-30 ENCOUNTER — Other Ambulatory Visit: Payer: Self-pay | Admitting: Internal Medicine

## 2017-04-22 DIAGNOSIS — H02831 Dermatochalasis of right upper eyelid: Secondary | ICD-10-CM | POA: Diagnosis not present

## 2017-04-22 DIAGNOSIS — H353131 Nonexudative age-related macular degeneration, bilateral, early dry stage: Secondary | ICD-10-CM | POA: Diagnosis not present

## 2017-04-22 DIAGNOSIS — H04123 Dry eye syndrome of bilateral lacrimal glands: Secondary | ICD-10-CM | POA: Diagnosis not present

## 2017-04-22 DIAGNOSIS — H40013 Open angle with borderline findings, low risk, bilateral: Secondary | ICD-10-CM | POA: Diagnosis not present

## 2017-04-23 ENCOUNTER — Encounter: Payer: Self-pay | Admitting: Internal Medicine

## 2017-04-23 ENCOUNTER — Ambulatory Visit (INDEPENDENT_AMBULATORY_CARE_PROVIDER_SITE_OTHER): Payer: Medicare Other | Admitting: Internal Medicine

## 2017-04-23 VITALS — BP 140/78 | HR 84 | Ht 62.0 in | Wt 166.0 lb

## 2017-04-23 DIAGNOSIS — K219 Gastro-esophageal reflux disease without esophagitis: Secondary | ICD-10-CM | POA: Diagnosis not present

## 2017-04-23 DIAGNOSIS — R1319 Other dysphagia: Secondary | ICD-10-CM

## 2017-04-23 DIAGNOSIS — R1011 Right upper quadrant pain: Secondary | ICD-10-CM | POA: Diagnosis not present

## 2017-04-23 DIAGNOSIS — D509 Iron deficiency anemia, unspecified: Secondary | ICD-10-CM

## 2017-04-23 DIAGNOSIS — R1310 Dysphagia, unspecified: Secondary | ICD-10-CM

## 2017-04-23 MED ORDER — NA SULFATE-K SULFATE-MG SULF 17.5-3.13-1.6 GM/177ML PO SOLN: 1.0000 | Freq: Once | ORAL | 0 refills | Status: AC

## 2017-04-23 NOTE — Progress Notes (Signed)
HISTORY OF PRESENT ILLNESS:  Julie Rice is a 81 y.o. female with a history of GERD, complicated by peptic stricture and large symptomatic hiatal hernia for which she underwent Nissen fundoplication and repair September 2017. I last saw the patient 01/09/2017 for dysphagia and anemia. See that dictation for details. Anemia panel suggested iron deficiency for which she was placed on iron therapy. B12 and folate normal. Upper endoscopy was performed 02/20/2017. The examination was unremarkable post fundoplication. The esophagogastric junction was dilated with a balloon up to 20 mm. Follow-up at this time arrange. Unfortunately, she continues with dysphagia without change. Mostly solids. She is careful to chew her food. She also reports occasional regurgitation with throat burning and cough. She is on omeprazole 40 mg daily. Next, she reports problems with sharp right upper quadrant pain on occasion. Somewhat affected by position. Previous ultrasound did not demonstrate gallstones. Her weight has been stable. Last colonoscopy 10 years ago.  REVIEW OF SYSTEMS:  All non-GI ROS negative except for anxiety, arthritis, back pain, depression, fatigue, urinary leakage, ankle swelling  Past Medical History:  Diagnosis Date  . Anemia   . Anxiety   . Arthritis    ra and oa  . Bipolar disorder (HCC)   . Blood transfusion 1991   autologous pts own blood given   . CAD (coronary artery disease)   . Chest pain    "@ rest, lying down, w/exertion"  . COPD (chronic obstructive pulmonary disease) (HCC)   . Depression   . Dyspnea   . Esophageal stricture   . Fecal occult blood test positive   . Fibromyalgia   . GERD (gastroesophageal reflux disease)   . Hiatal hernia   . Hyperlipidemia   . Hypertension   . Internal hemorrhoids   . Lumbago   . Mitral regurgitation   . PONV (postoperative nausea and vomiting)    severe ponv, did ok with last surgery  . Rectal bleeding   . SOB (shortness of breath)     with exertion  . Spondylosis   . TIA (transient ischemic attack) 2013  . Urinary incontinence    wears depends    Past Surgical History:  Procedure Laterality Date  . ABDOMINAL HYSTERECTOMY  1982  . BLADDER SUSPENSION  1980's  . CATARACT EXTRACTION W/ INTRAOCULAR LENS  IMPLANT, BILATERAL Bilateral 2010  . DILATION AND CURETTAGE OF UTERUS  1961  . HIATAL HERNIA REPAIR N/A 08/02/2016   Procedure: LAPAROSCOPIC REPAIR OF LARGE  HIATAL HERNIA;  Surgeon: Glenna Fellows, MD;  Location: [facility];  Service: General;  Laterality: N/A;  . LAPAROSCOPIC NISSEN FUNDOPLICATION N/A 08/02/2016   Procedure: LAPAROSCOPIC NISSEN FUNDOPLICATION;  Surgeon: Glenna Fellows, MD;  Location: [facility];  Service: General;  Laterality: N/A;  . LUMBAR LAMINECTOMY  1990; 1994; 1657;9038   "I've got 2 stainless steel rods; 6 screws; 2 ray cages"took bone from right hip to put in back  . TONSILLECTOMY AND ADENOIDECTOMY  1945  . TOTAL KNEE ARTHROPLASTY Right ~ 1996  . TUBAL LIGATION  ~ 1976    Social History Armanie C Cocker  reports that she has never smoked. She has never used smokeless tobacco. She reports that she does not drink alcohol or use drugs.  family history includes Heart disease (age of onset: 57) in her father; Hypertension in her mother; Kidney disease in her mother; Suicidality in her son.  Allergies  Allergen Reactions  . Metaxalone   . Morphine     Flushing, rash, itching  .  Tape Other (See Comments)    Band aides, adhesive tape Redness and pulls skin off       PHYSICAL EXAMINATION: Vital signs: BP 140/78 (BP Location: Left Arm, Patient Position: Sitting, Cuff Size: Normal)   Pulse 84   Ht 5\' 2"  (1.575 m)   Wt 166 lb (75.3 kg)   BMI 30.36 kg/m   Constitutional: generally well-appearing, no acute distress Psychiatric: alert and oriented x3, cooperative Eyes: extraocular movements intact, anicteric, conjunctiva pink Mouth: oral pharynx moist, no lesions Neck: supple no  lymphadenopathy Cardiovascular: heart regular rate and rhythm, no murmur Lungs: clear to auscultation bilaterally Abdomen: soft, nontender, nondistended, no obvious ascites, no peritoneal signs, normal bowel sounds, no organomegaly Rectal:Deferred until colonoscopy Extremities: no clubbing cyanosis or lower extremity edema bilaterally Skin: no lesions on visible extremities Neuro: No focal deficits. Cranial nerves intact   ASSESSMENT:  #1. Esophageal dysphagia. Likely related to fundoplication wrap. May be an element of underlying dysmotility. No response to a large caliber balloon dilation #2. GERD. She does have some regurgitation symptoms with burning. This despite PPI #3. Iron deficiency anemia. On iron therapy #4. Colonoscopy 10 years ago negative for neoplasia   PLAN:  #1. Chew food well #2. Strict adherence to reflux precautions #3. Continue PPI #4. Antacids for throat burning as needed #5. Colonoscopy to evaluate iron deficiency anemia.The nature of the procedure, as well as the risks, benefits, and alternatives were carefully and thoroughly reviewed with the patient. Ample time for discussion and questions allowed. The patient understood, was satisfied, and agreed to proceed. #6. Continue iron #7. Follow-up CBC in the laboratory at this time. Last hemoglobin 9.3 in October

## 2017-04-23 NOTE — Patient Instructions (Signed)

## 2017-04-24 ENCOUNTER — Other Ambulatory Visit: Payer: Self-pay | Admitting: Internal Medicine

## 2017-04-25 ENCOUNTER — Encounter: Payer: Self-pay | Admitting: Internal Medicine

## 2017-04-25 ENCOUNTER — Ambulatory Visit (INDEPENDENT_AMBULATORY_CARE_PROVIDER_SITE_OTHER): Payer: Medicare Other | Admitting: Internal Medicine

## 2017-04-25 VITALS — BP 146/68 | HR 81 | Temp 98.5°F | Ht 62.0 in | Wt 164.6 lb

## 2017-04-25 DIAGNOSIS — I10 Essential (primary) hypertension: Secondary | ICD-10-CM | POA: Diagnosis not present

## 2017-04-25 DIAGNOSIS — G451 Carotid artery syndrome (hemispheric): Secondary | ICD-10-CM | POA: Diagnosis not present

## 2017-04-25 DIAGNOSIS — K222 Esophageal obstruction: Secondary | ICD-10-CM

## 2017-04-25 DIAGNOSIS — E785 Hyperlipidemia, unspecified: Secondary | ICD-10-CM | POA: Diagnosis not present

## 2017-04-25 DIAGNOSIS — E034 Atrophy of thyroid (acquired): Secondary | ICD-10-CM | POA: Diagnosis not present

## 2017-04-25 DIAGNOSIS — F411 Generalized anxiety disorder: Secondary | ICD-10-CM

## 2017-04-25 DIAGNOSIS — Z0000 Encounter for general adult medical examination without abnormal findings: Secondary | ICD-10-CM

## 2017-04-25 LAB — CBC WITH DIFFERENTIAL/PLATELET
BASOS ABS: 0.1 10*3/uL (ref 0.0–0.1)
Basophils Relative: 1 % (ref 0.0–3.0)
EOS ABS: 0.5 10*3/uL (ref 0.0–0.7)
Eosinophils Relative: 5.8 % — ABNORMAL HIGH (ref 0.0–5.0)
HCT: 34.6 % — ABNORMAL LOW (ref 36.0–46.0)
Hemoglobin: 11.6 g/dL — ABNORMAL LOW (ref 12.0–15.0)
LYMPHS ABS: 2.1 10*3/uL (ref 0.7–4.0)
LYMPHS PCT: 23.7 % (ref 12.0–46.0)
MCHC: 33.4 g/dL (ref 30.0–36.0)
MCV: 88.6 fl (ref 78.0–100.0)
Monocytes Absolute: 0.5 10*3/uL (ref 0.1–1.0)
Monocytes Relative: 5.7 % (ref 3.0–12.0)
NEUTROS ABS: 5.7 10*3/uL (ref 1.4–7.7)
NEUTROS PCT: 63.8 % (ref 43.0–77.0)
PLATELETS: 265 10*3/uL (ref 150.0–400.0)
RBC: 3.91 Mil/uL (ref 3.87–5.11)
RDW: 14 % (ref 11.5–15.5)
WBC: 9 10*3/uL (ref 4.0–10.5)

## 2017-04-25 LAB — COMPREHENSIVE METABOLIC PANEL
ALT: 11 U/L (ref 0–35)
AST: 14 U/L (ref 0–37)
Albumin: 4.2 g/dL (ref 3.5–5.2)
Alkaline Phosphatase: 69 U/L (ref 39–117)
BUN: 37 mg/dL — ABNORMAL HIGH (ref 6–23)
CALCIUM: 9.4 mg/dL (ref 8.4–10.5)
CHLORIDE: 97 meq/L (ref 96–112)
CO2: 33 meq/L — AB (ref 19–32)
Creatinine, Ser: 1.17 mg/dL (ref 0.40–1.20)
GFR: 47.39 mL/min — AB (ref >60.00)
GLUCOSE: 92 mg/dL (ref 70–99)
Potassium: 3.7 mEq/L (ref 3.5–5.1)
Sodium: 140 mEq/L (ref 135–145)
Total Bilirubin: 0.4 mg/dL (ref 0.2–1.2)
Total Protein: 6.6 g/dL (ref 6.0–8.3)

## 2017-04-25 LAB — LIPID PANEL
Cholesterol: 234 mg/dL — ABNORMAL HIGH (ref 0–200)
HDL: 65 mg/dL (ref >39.00)
LDL CALC: 131 mg/dL — AB (ref 0–99)
NONHDL: 169.45
TRIGLYCERIDES: 191 mg/dL — AB (ref 0.0–149.0)
Total CHOL/HDL Ratio: 4
VLDL: 38.2 mg/dL (ref 0.0–40.0)

## 2017-04-25 LAB — TSH: TSH: 2.02 u[IU]/mL (ref 0.35–4.50)

## 2017-04-25 MED ORDER — BUPROPION HCL ER (SR) 150 MG PO TB12: 150.0000 mg | ORAL_TABLET | Freq: Two times a day (BID) | ORAL | 3 refills | Status: DC

## 2017-04-25 NOTE — Patient Instructions (Addendum)
Limit your sodium (Salt) intake  GI follow-up as scheduled    It is important that you exercise regularly, at least 20 minutes 3 to 4 times per week.  If you develop chest pain or shortness of breath seek  medical attention.  Return in 2 months for follow-up  Bupropion 1 tablet daily  Schedule your mammogram.

## 2017-04-25 NOTE — Progress Notes (Signed)
Subjective:    Patient ID: Julie Rice, female    DOB: October 21, 1936, 81 y.o.   MRN: [id number]  HPI  81 year old patient who is seen today for a annual preventive health examination and subsequent Medicare wellness visit. She has a history of essential hypertension.  She is followed by GI closely due to an esophageal stricture. She has a history of anxiety, depression and also osteoarthritis.  She has been followed by behavioral health and also has a history of low back pain  Past Medical History:  Diagnosis Date  . Anemia   . Anxiety   . Arthritis    ra and oa  . Bipolar disorder (HCC)   . Blood transfusion 1991   autologous pts own blood given   . CAD (coronary artery disease)   . Chest pain    "@ rest, lying down, w/exertion"  . COPD (chronic obstructive pulmonary disease) (HCC)   . Depression   . Dyspnea   . Esophageal stricture   . Fecal occult blood test positive   . Fibromyalgia   . GERD (gastroesophageal reflux disease)   . Hiatal hernia   . Hyperlipidemia   . Hypertension   . Internal hemorrhoids   . Lumbago   . Mitral regurgitation   . PONV (postoperative nausea and vomiting)    severe ponv, did ok with last surgery  . Rectal bleeding   . SOB (shortness of breath)    with exertion  . Spondylosis   . TIA (transient ischemic attack) 2013  . Urinary incontinence    wears depends     Social History   Social History  . Marital status: Married    Spouse name: N/A  . Number of children: 2 D  . Years of education: N/A   Occupational History  .  Retired   Social History Main Topics  . Smoking status: Never Smoker  . Smokeless tobacco: Never Used  . Alcohol use No  . Drug use: No  . Sexual activity: Not Currently   Other Topics Concern  . Not on file   Social History Narrative  . No narrative on file    Past Surgical History:  Procedure Laterality Date  . ABDOMINAL HYSTERECTOMY  1982  . BLADDER SUSPENSION  1980's  . CATARACT EXTRACTION W/  INTRAOCULAR LENS  IMPLANT, BILATERAL Bilateral 2010  . DILATION AND CURETTAGE OF UTERUS  1961  . HIATAL HERNIA REPAIR N/A 08/02/2016   Procedure: LAPAROSCOPIC REPAIR OF LARGE  HIATAL HERNIA;  Surgeon: Glenna Fellows, MD;  Location: [facility];  Service: General;  Laterality: N/A;  . LAPAROSCOPIC NISSEN FUNDOPLICATION N/A 08/02/2016   Procedure: LAPAROSCOPIC NISSEN FUNDOPLICATION;  Surgeon: Glenna Fellows, MD;  Location: [facility];  Service: General;  Laterality: N/A;  . LUMBAR LAMINECTOMY  1990; 1994; 1443;1540   "I've got 2 stainless steel rods; 6 screws; 2 ray cages"took bone from right hip to put in back  . TONSILLECTOMY AND ADENOIDECTOMY  1945  . TOTAL KNEE ARTHROPLASTY Right ~ 1996  . TUBAL LIGATION  ~ 1976    Family History  Problem Relation Age of Onset  . Kidney disease Mother   . Hypertension Mother   . Heart disease Father 23       MI  . Suicidality Son     Allergies  Allergen Reactions  . Metaxalone   . Morphine     Flushing, rash, itching  . Tape Other (See Comments)    Band aides, adhesive tape Redness and pulls  skin off    Current Outpatient Prescriptions on File Prior to Visit  Medication Sig Dispense Refill  . aspirin 81 MG tablet Take 1 tablet (81 mg total) by mouth daily. For cardiovascular health.    . clonazePAM (KLONOPIN) 0.5 MG tablet TAKE 1 TABLT 3 TIMES A DAY AS NEEDED 270 tablet 0  . ferrous sulfate 325 (65 FE) MG tablet Take 1 tablet (325 mg total) by mouth 2 (two) times daily with a meal. 60 tablet 3  . furosemide (LASIX) 40 MG tablet TAKE 1 TABLET BY MOUTH EVERY DAY 90 tablet 1  . gabapentin (NEURONTIN) 100 MG capsule TAKE ONE CAPSULE BY MOUTH 3 TIMES A DAY 270 capsule 0  . losartan-hydrochlorothiazide (HYZAAR) 100-25 MG tablet Take 1 tablet by mouth daily. 90 tablet 3  . meloxicam (MOBIC) 15 MG tablet TAKE 1 TABLET BY MOUTH EVERY DAY 90 tablet 0  . Multiple Vitamin (MULTIVITAMIN) tablet Take 1 tablet by mouth daily. Vitamin supplement.    Marland Kitchen  omeprazole (PRILOSEC) 40 MG capsule TAKE ONE CAPSULE BY MOUTH EVERY DAY 90 capsule 1  . sertraline (ZOLOFT) 100 MG tablet TAKE 1 TABLET BY MOUTH EVERY DAY 90 tablet 0   Current Facility-Administered Medications on File Prior to Visit  Medication Dose Route Frequency Provider Last Rate Last Dose  . 0.9 %  sodium chloride infusion  500 mL Intravenous Continuous Hilarie Fredrickson, MD        BP (!) 146/68 (BP Location: Left Arm, Patient Position: Sitting, Cuff Size: Normal)   Pulse 81   Temp 98.5 F (36.9 C)   Ht 5\' 2"  (1.575 m)   Wt 164 lb 9.6 oz (74.7 kg)   SpO2 97%   BMI 30.11 kg/m   Medicare wellness visit  1. Risk factors, based on past  M,S,F history.  Cardiovascular risk factors include a history of hypertension.  2.  Physical activities:very little exercise.  She does have a history of arthritis which limits her activities.  She is status post right total knee replacement therapy  3.  Depression/mood: history of a anxiety, depression.  She has been seen by behavioral health in the past.  Remains on antidepressant therapy.  Remains clinically depressed  4.  Hearing:no major deficits  5.  ADL's:independent  6.  Fall risk:low.  Patient does state that she has a fear of falling.  However  7.  Home safety:no problems identified  8.  Height weight, and visual acuity;height and weight stable no change in visual acuity.  Has had a recent eye examination  9.  Counseling:continue heart healthy diet more regular exercise encouraged  10. Lab orders based on risk factors:we'll check laboratory update including lipid profile  11. Referral :follow GI and cardiology  12. Care plan:continue efforts at aggressive risk factor modification  13. Cognitive assessment: alert  And oriented with normal affect no cognitive dysfunction  14. Screening: Patient provided with a written and personalized 5-10 year screening schedule in the AVS.    15. Provider List Update: ophthalmology primary care  cardiology and GI    Review of Systems  HENT: Negative for congestion, dental problem, hearing loss, rhinorrhea, sinus pressure, sore throat and tinnitus.   Eyes: Negative for pain, discharge and visual disturbance.  Respiratory: Negative for cough and shortness of breath.   Cardiovascular: Negative for chest pain, palpitations and leg swelling.  Gastrointestinal: Positive for abdominal pain. Negative for abdominal distention, blood in stool, constipation, diarrhea, nausea and vomiting.  Genitourinary: Negative for difficulty urinating,  dysuria, flank pain, frequency, hematuria, pelvic pain, urgency, vaginal bleeding, vaginal discharge and vaginal pain.  Musculoskeletal: Positive for arthralgias and back pain. Negative for gait problem and joint swelling.  Skin: Negative for rash.  Neurological: Positive for weakness and numbness. Negative for dizziness, syncope, speech difficulty and headaches.  Hematological: Negative for adenopathy.  Psychiatric/Behavioral: Positive for dysphoric mood. Negative for agitation and behavioral problems. The patient is not nervous/anxious.        Objective:   Physical Exam  Constitutional: She is oriented to person, place, and time. She appears well-developed and well-nourished.  HENT:  Head: Normocephalic.  Right Ear: External ear normal.  Left Ear: External ear normal.  Mouth/Throat: Oropharynx is clear and moist.  Eyes: Conjunctivae and EOM are normal. Pupils are equal, round, and reactive to light.  Neck: Normal range of motion. Neck supple. No thyromegaly present.  Cardiovascular: Normal rate, regular rhythm and normal heart sounds.   Pedal pulses absent except for a right dorsalis pedis pulse  Pulmonary/Chest: Effort normal and breath sounds normal.  Abdominal: Soft. Bowel sounds are normal. She exhibits no mass. There is no tenderness.  Laparoscopic abdominal scars as well as a lower transverse scar  Musculoskeletal: Normal range of motion.    Osteoarthritic changes involving the small joints of the hands Status post right total knee replacement surgery  Lymphadenopathy:    She has no cervical adenopathy.  Neurological: She is alert and oriented to person, place, and time.  Decreased vibratory sensation distal to the knees decreased monofilament testing.  Proprioception intact  Heel to shin testing normal without dysmetria  Skin: Skin is warm and dry. No rash noted.  Psychiatric: She has a normal mood and affect. Her behavior is normal.          Assessment & Plan:   Preventive health Medicare wellness visit Clinical depression.  Will augment with  Bupropion. .  Recheck 6 weeks.  Continue SSRI.  Consider psychiatric referral next visit Osteoarthritis Esophageal stricture  Recheck 6 weeks Review laboratory update  Rogelia Boga

## 2017-04-26 ENCOUNTER — Other Ambulatory Visit: Payer: Self-pay | Admitting: Internal Medicine

## 2017-04-29 ENCOUNTER — Encounter: Payer: Self-pay | Admitting: Internal Medicine

## 2017-04-30 ENCOUNTER — Telehealth: Payer: Self-pay | Admitting: Internal Medicine

## 2017-05-02 ENCOUNTER — Other Ambulatory Visit: Payer: Self-pay | Admitting: Internal Medicine

## 2017-05-03 ENCOUNTER — Other Ambulatory Visit: Payer: Self-pay | Admitting: Internal Medicine

## 2017-05-06 ENCOUNTER — Other Ambulatory Visit (INDEPENDENT_AMBULATORY_CARE_PROVIDER_SITE_OTHER): Payer: Medicare Other

## 2017-05-06 ENCOUNTER — Encounter: Payer: Self-pay | Admitting: Internal Medicine

## 2017-05-06 DIAGNOSIS — R300 Dysuria: Secondary | ICD-10-CM | POA: Diagnosis not present

## 2017-05-06 LAB — POC URINALSYSI DIPSTICK (AUTOMATED)
Bilirubin, UA: NEGATIVE
Glucose, UA: NEGATIVE
KETONES UA: NEGATIVE
Nitrite, UA: POSITIVE
RBC UA: NEGATIVE
Urobilinogen, UA: 0.2 E.U./dL
pH, UA: 8.5 — AB (ref 5.0–8.0)

## 2017-05-06 NOTE — Telephone Encounter (Signed)
Pt contacted. She has been notified to call the week before her scheduled colonoscopy and ask for the free prep.

## 2017-05-06 NOTE — Telephone Encounter (Signed)
Dr. K - Please advise. Thanks! 

## 2017-05-06 NOTE — Telephone Encounter (Signed)
Pt states she was in last week for a cpe. Now is having the UA issue. Very foul smelling urine. Would like ato come and give a sample. Please advise thanks.  Pt declined appt until she hears from Dr Kirtland Bouchard

## 2017-05-08 ENCOUNTER — Other Ambulatory Visit: Payer: Self-pay | Admitting: Internal Medicine

## 2017-05-08 MED ORDER — SULFAMETHOXAZOLE-TRIMETHOPRIM 800-160 MG PO TABS: 1.0000 | ORAL_TABLET | Freq: Two times a day (BID) | ORAL | 0 refills | Status: DC

## 2017-05-09 LAB — URINE CULTURE

## 2017-05-12 ENCOUNTER — Other Ambulatory Visit: Payer: Self-pay | Admitting: Internal Medicine

## 2017-05-15 ENCOUNTER — Encounter: Payer: Self-pay | Admitting: Internal Medicine

## 2017-05-19 ENCOUNTER — Ambulatory Visit (INDEPENDENT_AMBULATORY_CARE_PROVIDER_SITE_OTHER): Payer: Medicare Other | Admitting: Internal Medicine

## 2017-05-19 ENCOUNTER — Encounter: Payer: Self-pay | Admitting: Internal Medicine

## 2017-05-19 VITALS — BP 124/70 | HR 78 | Temp 97.8°F | Ht 62.0 in | Wt 163.0 lb

## 2017-05-19 DIAGNOSIS — F411 Generalized anxiety disorder: Secondary | ICD-10-CM | POA: Diagnosis not present

## 2017-05-19 DIAGNOSIS — I10 Essential (primary) hypertension: Secondary | ICD-10-CM | POA: Diagnosis not present

## 2017-05-19 DIAGNOSIS — R251 Tremor, unspecified: Secondary | ICD-10-CM | POA: Diagnosis not present

## 2017-05-19 NOTE — Patient Instructions (Signed)
Wellbutrin  1 half tablet daily for one week, then 1 half tablet twice daily for one week,  One half tablet in the morning and 1 full tablet in the late afternoon for 1 week, then 1 tablet twice daily  Return in 2 months for follow-up

## 2017-05-19 NOTE — Progress Notes (Signed)
Subjective:    Patient ID: Julie Rice, female    DOB: 18-Apr-1936, 81 y.o.   MRN: [id number]  HPI 81 year old patient who was last seen here in the office on June 22 for an annual health assessment.  She has a long history depression as well as anxiety disorder.  She was felt to be more depressed and Wellbutrin was added to her regimen. Since her last visit, there have been for the mail communications.  She was complaining of tremor, but she does have a long history of essential tremor.  Her chief complaint was some memory issues.  She states that she never has had a great sense of direction, but she feels that at times she has easily become lost.  In spite of using GPS.  MMSE today 26/30.  She was able to recall 2 of 3 items  Past Medical History:  Diagnosis Date  . Anemia   . Anxiety   . Arthritis    ra and oa  . Bipolar disorder (HCC)   . Blood transfusion 1991   autologous pts own blood given   . CAD (coronary artery disease)   . Chest pain    "@ rest, lying down, w/exertion"  . COPD (chronic obstructive pulmonary disease) (HCC)   . Depression   . Dyspnea   . Esophageal stricture   . Fecal occult blood test positive   . Fibromyalgia   . GERD (gastroesophageal reflux disease)   . Hiatal hernia   . Hyperlipidemia   . Hypertension   . Internal hemorrhoids   . Lumbago   . Mitral regurgitation   . PONV (postoperative nausea and vomiting)    severe ponv, did ok with last surgery  . Rectal bleeding   . SOB (shortness of breath)    with exertion  . Spondylosis   . TIA (transient ischemic attack) 2013  . Urinary incontinence    wears depends     Social History   Social History  . Marital status: Married    Spouse name: N/A  . Number of children: 2 D  . Years of education: N/A   Occupational History  .  Retired   Social History Main Topics  . Smoking status: Never Smoker  . Smokeless tobacco: Never Used  . Alcohol use No  . Drug use: No  . Sexual  activity: Not Currently   Other Topics Concern  . Not on file   Social History Narrative  . No narrative on file    Past Surgical History:  Procedure Laterality Date  . ABDOMINAL HYSTERECTOMY  1982  . BLADDER SUSPENSION  1980's  . CATARACT EXTRACTION W/ INTRAOCULAR LENS  IMPLANT, BILATERAL Bilateral 2010  . DILATION AND CURETTAGE OF UTERUS  1961  . HIATAL HERNIA REPAIR N/A 08/02/2016   Procedure: LAPAROSCOPIC REPAIR OF LARGE  HIATAL HERNIA;  Surgeon: Glenna Fellows, MD;  Location: [facility];  Service: General;  Laterality: N/A;  . LAPAROSCOPIC NISSEN FUNDOPLICATION N/A 08/02/2016   Procedure: LAPAROSCOPIC NISSEN FUNDOPLICATION;  Surgeon: Glenna Fellows, MD;  Location: [facility];  Service: General;  Laterality: N/A;  . LUMBAR LAMINECTOMY  1990; 1994; [id number]   "I've got 2 stainless steel rods; 6 screws; 2 ray cages"took bone from right hip to put in back  . TONSILLECTOMY AND ADENOIDECTOMY  1945  . TOTAL KNEE ARTHROPLASTY Right ~ 1996  . TUBAL LIGATION  ~ 1976    Family History  Problem Relation Age of Onset  . Kidney disease Mother   .  Hypertension Mother   . Heart disease Father 8       MI  . Suicidality Son     Allergies  Allergen Reactions  . Metaxalone   . Morphine     Flushing, rash, itching  . Tape Other (See Comments)    Band aides, adhesive tape Redness and pulls skin off    Current Outpatient Prescriptions on File Prior to Visit  Medication Sig Dispense Refill  . aspirin 81 MG tablet Take 1 tablet (81 mg total) by mouth daily. For cardiovascular health.    . clonazePAM (KLONOPIN) 0.5 MG tablet TAKE 1 TABLET BY MOUTH 3 TIMES A DAY AS NEEDED 270 tablet 0  . ferrous sulfate 325 (65 FE) MG tablet Take 1 tablet (325 mg total) by mouth 2 (two) times daily with a meal. 60 tablet 3  . furosemide (LASIX) 40 MG tablet TAKE 1 TABLET BY MOUTH EVERY DAY 90 tablet 1  . gabapentin (NEURONTIN) 100 MG capsule TAKE ONE CAPSULE BY MOUTH 3 TIMES A DAY 270 capsule 0  .  losartan-hydrochlorothiazide (HYZAAR) 100-25 MG tablet Take 1 tablet by mouth daily. 90 tablet 3  . meloxicam (MOBIC) 15 MG tablet TAKE 1 TABLET BY MOUTH EVERY DAY 90 tablet 0  . Multiple Vitamin (MULTIVITAMIN) tablet Take 1 tablet by mouth daily. Vitamin supplement.    Marland Kitchen omeprazole (PRILOSEC) 40 MG capsule TAKE ONE CAPSULE BY MOUTH EVERY DAY 90 capsule 1  . sertraline (ZOLOFT) 100 MG tablet TAKE 1 TABLET BY MOUTH DAILY 90 tablet 0   Current Facility-Administered Medications on File Prior to Visit  Medication Dose Route Frequency Provider Last Rate Last Dose  . 0.9 %  sodium chloride infusion  500 mL Intravenous Continuous Hilarie Fredrickson, MD        BP 124/70 (BP Location: Left Arm, Patient Position: Sitting, Cuff Size: Normal)   Pulse 78   Temp 97.8 F (36.6 C) (Oral)   Ht 5\' 2"  (1.575 m)   Wt 163 lb (73.9 kg)   SpO2 98%   BMI 29.81 kg/m      Review of Systems  Constitutional: Negative.   HENT: Negative for congestion, dental problem, hearing loss, rhinorrhea, sinus pressure, sore throat and tinnitus.   Eyes: Negative for pain, discharge and visual disturbance.  Respiratory: Negative for cough and shortness of breath.   Cardiovascular: Negative for chest pain, palpitations and leg swelling.  Gastrointestinal: Negative for abdominal distention, abdominal pain, blood in stool, constipation, diarrhea, nausea and vomiting.  Genitourinary: Negative for difficulty urinating, dysuria, flank pain, frequency, hematuria, pelvic pain, urgency, vaginal bleeding, vaginal discharge and vaginal pain.  Musculoskeletal: Negative for arthralgias, gait problem and joint swelling.  Skin: Negative for rash.  Neurological: Positive for tremors. Negative for dizziness, syncope, speech difficulty, weakness, numbness and headaches.  Hematological: Negative for adenopathy.  Psychiatric/Behavioral: Positive for dysphoric mood. Negative for agitation and behavioral problems. The patient is nervous/anxious.          Objective:   Physical Exam  Constitutional: She appears well-developed and well-nourished. She appears distressed.  Neurological:  Essential tremor noted  MMSE 26/30          Assessment & Plan:   Depression.  Wellbutrin.  Presently on hold.  We'll resume at a low dose and slowly uptitrate.  The tremors intensify we'll discontinue.  Reassess 2 months Essential tremor.  May have been aggravated by Wellbutrin Memory impairment.  MMSE 26/30.  We'll continue to observe Essential hypertension, stable  

## 2017-06-10 ENCOUNTER — Encounter: Payer: Self-pay | Admitting: Internal Medicine

## 2017-06-23 ENCOUNTER — Ambulatory Visit (INDEPENDENT_AMBULATORY_CARE_PROVIDER_SITE_OTHER): Payer: Medicare Other | Admitting: Internal Medicine

## 2017-06-23 ENCOUNTER — Encounter: Payer: Self-pay | Admitting: Internal Medicine

## 2017-06-23 VITALS — BP 142/76 | HR 80 | Temp 97.4°F | Ht 62.0 in | Wt 165.0 lb

## 2017-06-23 DIAGNOSIS — F411 Generalized anxiety disorder: Secondary | ICD-10-CM

## 2017-06-23 DIAGNOSIS — E039 Hypothyroidism, unspecified: Secondary | ICD-10-CM | POA: Diagnosis not present

## 2017-06-23 DIAGNOSIS — I10 Essential (primary) hypertension: Secondary | ICD-10-CM | POA: Diagnosis not present

## 2017-06-23 NOTE — Progress Notes (Signed)
Subjective:    Patient ID: Julie Rice, female    DOB: [birth year], 81 y.o.   MRN: [id number]  HPI 81 year old patient who is seen today for follow-up.  Complaints include some swallowing issues. She is status post Nissan fundoplication and did have follow-up barium swallow performed in November of last year.  This suggested some dysmotility and spasm but otherwise a normal appearance.  Postoperatively Other complaints include increasing tremor, lightheadedness, insomnia cough, rib pain and ankle edema.  Medical regimen includes both sertraline and Klonopin She has a history of depression as well as anxiety disorder  Past Medical History:  Diagnosis Date  . Anemia   . Anxiety   . Arthritis    ra and oa  . Bipolar disorder (HCC)   . Blood transfusion 1991   autologous pts own blood given   . CAD (coronary artery disease)   . Chest pain    "@ rest, lying down, w/exertion"  . COPD (chronic obstructive pulmonary disease) (HCC)   . Depression   . Dyspnea   . Esophageal stricture   . Fecal occult blood test positive   . Fibromyalgia   . GERD (gastroesophageal reflux disease)   . Hiatal hernia   . Hyperlipidemia   . Hypertension   . Internal hemorrhoids   . Lumbago   . Mitral regurgitation   . PONV (postoperative nausea and vomiting)    severe ponv, did ok with last surgery  . Rectal bleeding   . SOB (shortness of breath)    with exertion  . Spondylosis   . TIA (transient ischemic attack) 2013  . Urinary incontinence    wears depends     Social History   Social History  . Marital status: Married    Spouse name: N/A  . Number of children: 2 D  . Years of education: N/A   Occupational History  .  Retired   Social History Main Topics  . Smoking status: Never Smoker  . Smokeless tobacco: Never Used  . Alcohol use No  . Drug use: No  . Sexual activity: Not Currently   Other Topics Concern  . Not on file   Social History Narrative  . No narrative on file      Past Surgical History:  Procedure Laterality Date  . ABDOMINAL HYSTERECTOMY  1982  . BLADDER SUSPENSION  1980's  . CATARACT EXTRACTION W/ INTRAOCULAR LENS  IMPLANT, BILATERAL Bilateral 2010  . DILATION AND CURETTAGE OF UTERUS  1961  . HIATAL HERNIA REPAIR N/A 08/02/2016   Procedure: LAPAROSCOPIC REPAIR OF LARGE  HIATAL HERNIA;  Surgeon: Glenna Fellows, MD;  Location: [facility];  Service: General;  Laterality: N/A;  . LAPAROSCOPIC NISSEN FUNDOPLICATION N/A 08/02/2016   Procedure: LAPAROSCOPIC NISSEN FUNDOPLICATION;  Surgeon: Glenna Fellows, MD;  Location: [facility];  Service: General;  Laterality: N/A;  . LUMBAR LAMINECTOMY  1990; 1994; [id number]   "I've got 2 stainless steel rods; 6 screws; 2 ray cages"took bone from right hip to put in back  . TONSILLECTOMY AND ADENOIDECTOMY  1945  . TOTAL KNEE ARTHROPLASTY Right ~ 1996  . TUBAL LIGATION  ~ 1976    Family History  Problem Relation Age of Onset  . Kidney disease Mother   . Hypertension Mother   . Heart disease Father 51       MI  . Suicidality Son     Allergies  Allergen Reactions  . Metaxalone   . Morphine     Flushing, rash, itching  .  Tape Other (See Comments)    Band aides, adhesive tape Redness and pulls skin off    Current Outpatient Prescriptions on File Prior to Visit  Medication Sig Dispense Refill  . aspirin 81 MG tablet Take 1 tablet (81 mg total) by mouth daily. For cardiovascular health.    . clonazePAM (KLONOPIN) 0.5 MG tablet TAKE 1 TABLET BY MOUTH 3 TIMES A DAY AS NEEDED 270 tablet 0  . ferrous sulfate 325 (65 FE) MG tablet Take 1 tablet (325 mg total) by mouth 2 (two) times daily with a meal. 60 tablet 3  . furosemide (LASIX) 40 MG tablet TAKE 1 TABLET BY MOUTH EVERY DAY 90 tablet 1  . gabapentin (NEURONTIN) 100 MG capsule TAKE ONE CAPSULE BY MOUTH 3 TIMES A DAY 270 capsule 0  . losartan-hydrochlorothiazide (HYZAAR) 100-25 MG tablet Take 1 tablet by mouth daily. 90 tablet 3  . meloxicam (MOBIC) 15 MG  tablet TAKE 1 TABLET BY MOUTH EVERY DAY 90 tablet 0  . Multiple Vitamin (MULTIVITAMIN) tablet Take 1 tablet by mouth daily. Vitamin supplement.    Marland Kitchen omeprazole (PRILOSEC) 40 MG capsule TAKE ONE CAPSULE BY MOUTH EVERY DAY 90 capsule 1  . sertraline (ZOLOFT) 100 MG tablet TAKE 1 TABLET BY MOUTH DAILY 90 tablet 0   Current Facility-Administered Medications on File Prior to Visit  Medication Dose Route Frequency Provider Last Rate Last Dose  . 0.9 %  sodium chloride infusion  500 mL Intravenous Continuous Hilarie Fredrickson, MD        BP (!) 142/76 (BP Location: Left Arm, Patient Position: Sitting, Cuff Size: Normal)   Pulse 80   Temp (!) 97.4 F (36.3 C) (Oral)   Ht 5\' 2"  (1.575 m)   Wt 165 lb (74.8 kg)   SpO2 97%   BMI 30.18 kg/m     Review of Systems  Constitutional: Positive for activity change.  HENT: Negative for congestion, dental problem, hearing loss, rhinorrhea, sinus pressure, sore throat and tinnitus.   Eyes: Negative for pain, discharge and visual disturbance.  Respiratory: Positive for cough. Negative for shortness of breath.   Cardiovascular: Positive for chest pain and leg swelling. Negative for palpitations.  Gastrointestinal: Negative for abdominal distention, abdominal pain, blood in stool, constipation, diarrhea, nausea and vomiting.  Genitourinary: Negative for difficulty urinating, dysuria, flank pain, frequency, hematuria, pelvic pain, urgency, vaginal bleeding, vaginal discharge and vaginal pain.  Musculoskeletal: Negative for arthralgias, gait problem and joint swelling.  Skin: Negative for rash.  Neurological: Positive for tremors and light-headedness. Negative for dizziness, syncope, speech difficulty, weakness, numbness and headaches.  Hematological: Negative for adenopathy.  Psychiatric/Behavioral: Positive for sleep disturbance. Negative for agitation, behavioral problems and dysphoric mood. The patient is nervous/anxious.        Objective:   Physical Exam    Constitutional: She is oriented to person, place, and time. She appears well-developed and well-nourished. No distress.  Blood pressure well controlled  HENT:  Head: Normocephalic.  Right Ear: External ear normal.  Left Ear: External ear normal.  Mouth/Throat: Oropharynx is clear and moist.  Eyes: Pupils are equal, round, and reactive to light. Conjunctivae and EOM are normal.  Neck: Normal range of motion. Neck supple. No thyromegaly present.  Cardiovascular: Normal rate, regular rhythm, normal heart sounds and intact distal pulses.   Pulmonary/Chest: Effort normal and breath sounds normal.  Abdominal: Soft. Bowel sounds are normal. She exhibits no mass. There is no tenderness.  Musculoskeletal: Normal range of motion. She exhibits edema.  Lymphadenopathy:  She has no cervical adenopathy.  Neurological: She is alert and oriented to person, place, and time.  Skin: Skin is warm and dry. No rash noted.  Psychiatric: She has a normal mood and affect. Her behavior is normal.          Assessment & Plan:  Anxiety disorder Multiple somatic complaints Status post Nissen fundoplication Ankle edema.  Continue when necessary furosemide;  limit salt Essential hypertension, stable  Will minimize use of meloxicam Follow-up GI if needed No change in medical regimen  Rogelia Boga

## 2017-06-23 NOTE — Patient Instructions (Signed)
Discontinue meloxicam  Limit your sodium (Salt) intake  Furosemide as needed  GI follow-up as scheduled  Return in 3 months for follow-up

## 2017-06-27 ENCOUNTER — Telehealth: Payer: Self-pay | Admitting: Internal Medicine

## 2017-06-30 ENCOUNTER — Telehealth: Payer: Self-pay | Admitting: Internal Medicine

## 2017-06-30 MED ORDER — NA SULFATE-K SULFATE-MG SULF 17.5-3.13-1.6 GM/177ML PO SOLN: 1.0000 | Freq: Once | ORAL | 0 refills | Status: AC

## 2017-06-30 NOTE — Telephone Encounter (Signed)
Pt. c/o a scratchy throat starting yesterday and is running a low grade fever of 99.5.  She has vomited x 2.  Patient is asking if she should have the colonoscopy tomorrow with these symptoms.  Please advise.  Thanks,  B. Benitez, CMA-Admitting

## 2017-06-30 NOTE — Telephone Encounter (Signed)
Pt called back and rescheduled her procedure from tomorrow until 08-07-17 due to her symptoms and running a low grade temp

## 2017-06-30 NOTE — Telephone Encounter (Signed)
suprep sent to pharmacy

## 2017-07-01 ENCOUNTER — Encounter: Payer: Medicare Other | Admitting: Internal Medicine

## 2017-07-06 ENCOUNTER — Other Ambulatory Visit: Payer: Self-pay | Admitting: Internal Medicine

## 2017-07-11 ENCOUNTER — Other Ambulatory Visit: Payer: Self-pay | Admitting: Internal Medicine

## 2017-07-19 ENCOUNTER — Other Ambulatory Visit: Payer: Self-pay | Admitting: Internal Medicine

## 2017-07-24 ENCOUNTER — Telehealth: Payer: Self-pay | Admitting: Internal Medicine

## 2017-07-24 ENCOUNTER — Encounter: Payer: Self-pay | Admitting: Internal Medicine

## 2017-07-24 NOTE — Telephone Encounter (Signed)
My Chart Message from pt:  Severe stomach pain & right side. Think I need to be seen before the colonoscopy. I have sent a message to Dr. Kirtland Bouchard.    Thank you so much

## 2017-07-24 NOTE — Telephone Encounter (Signed)
Pt states she continues to have right sided abdominal pain. States she laid down the other day and could not get out of the fetal position. Pt scheduled to see Mike Gip PA tomorrow at 3pm. Pt aware of appt.

## 2017-07-25 ENCOUNTER — Ambulatory Visit (INDEPENDENT_AMBULATORY_CARE_PROVIDER_SITE_OTHER): Payer: Medicare Other | Admitting: Physician Assistant

## 2017-07-25 ENCOUNTER — Encounter: Payer: Self-pay | Admitting: Physician Assistant

## 2017-07-25 ENCOUNTER — Other Ambulatory Visit (INDEPENDENT_AMBULATORY_CARE_PROVIDER_SITE_OTHER): Payer: Medicare Other

## 2017-07-25 VITALS — BP 120/78 | HR 76 | Ht 62.0 in | Wt 164.6 lb

## 2017-07-25 DIAGNOSIS — Z9889 Other specified postprocedural states: Secondary | ICD-10-CM

## 2017-07-25 DIAGNOSIS — D509 Iron deficiency anemia, unspecified: Secondary | ICD-10-CM

## 2017-07-25 DIAGNOSIS — R1011 Right upper quadrant pain: Secondary | ICD-10-CM

## 2017-07-25 LAB — COMPREHENSIVE METABOLIC PANEL
ALK PHOS: 69 U/L (ref 39–117)
ALT: 12 U/L (ref 0–35)
AST: 16 U/L (ref 0–37)
Albumin: 4.1 g/dL (ref 3.5–5.2)
BUN: 25 mg/dL — AB (ref 6–23)
CALCIUM: 9.3 mg/dL (ref 8.4–10.5)
CO2: 33 meq/L — AB (ref 19–32)
Chloride: 101 mEq/L (ref 96–112)
Creatinine, Ser: 1.03 mg/dL (ref 0.40–1.20)
GFR: 54.86 mL/min — AB (ref >60.00)
Glucose, Bld: 103 mg/dL — ABNORMAL HIGH (ref 70–99)
Potassium: 3.7 mEq/L (ref 3.5–5.1)
Sodium: 141 mEq/L (ref 135–145)
TOTAL PROTEIN: 6.7 g/dL (ref 6.0–8.3)
Total Bilirubin: 0.4 mg/dL (ref 0.2–1.2)

## 2017-07-25 LAB — CBC WITH DIFFERENTIAL/PLATELET
BASOS ABS: 0.1 10*3/uL (ref 0.0–0.1)
Basophils Relative: 1.3 % (ref 0.0–3.0)
EOS PCT: 3.8 % (ref 0.0–5.0)
Eosinophils Absolute: 0.3 10*3/uL (ref 0.0–0.7)
HEMATOCRIT: 36.3 % (ref 36.0–46.0)
Hemoglobin: 12.1 g/dL (ref 12.0–15.0)
LYMPHS ABS: 2.3 10*3/uL (ref 0.7–4.0)
LYMPHS PCT: 27.3 % (ref 12.0–46.0)
MCHC: 33.3 g/dL (ref 30.0–36.0)
MCV: 91.4 fl (ref 78.0–100.0)
MONOS PCT: 5.9 % (ref 3.0–12.0)
Monocytes Absolute: 0.5 10*3/uL (ref 0.1–1.0)
NEUTROS ABS: 5.2 10*3/uL (ref 1.4–7.7)
NEUTROS PCT: 61.7 % (ref 43.0–77.0)
PLATELETS: 265 10*3/uL (ref 150.0–400.0)
RBC: 3.97 Mil/uL (ref 3.87–5.11)
RDW: 13.5 % (ref 11.5–15.5)
WBC: 8.4 10*3/uL (ref 4.0–10.5)

## 2017-07-25 NOTE — Progress Notes (Signed)
Subjective:    Patient ID: [name], female    DOB: [birth year], 81 y.o.   MRN: [id number]  HPI Maurice is a 81 year old white female, known to Dr. [name] who was last seen in the office in June 2018. She had complaints of right upper quadrant pain at that time and also iron deficiency anemia. She was scheduled for a colonoscopy, which she subsequently canceled. She comes back in today with continued complaints of right upper quadrant pain which she says started some time after she had her Nissen fundoplication in 2017. She feels that her pain has progressed over the past few months and is fairly constant at this point. She says she had a bad episode last week with pain and had to lay in bed on her left side in a fetal position for couple of hours to ease the pain. She describes it as being sharp at times with constant soreness. his pain seems to be worse at times postprandially and has been associated with nausea. She has not noted any melena or hematochezia, she has mild chronic constipation and has a bowel movement 2 or 3 times per week. She did undergo esophageal dilation in April 2018 but says she is still having some mild dysphagia, though not as bad as it had been. She's been taking omeprazole 40 mg daily. She mentions that she does have some nocturnal reflux symptoms and seems to bring up a "slimy" substance. Right upper quadrant ultrasound in 2017 was negative for cholelithiasis.  Review of Systems Pertinent positive and negative review of systems were noted in the above HPI section.  All other review of systems was otherwise negative.  Outpatient Encounter Prescriptions as of 07/25/2017  Medication Sig  . aspirin 81 MG tablet Take 1 tablet (81 mg total) by mouth daily. For cardiovascular health.  . clonazePAM (KLONOPIN) 0.5 MG tablet TAKE 1 TABLET BY MOUTH 3 TIMES A DAY AS NEEDED  . ferrous sulfate 325 (65 FE) MG tablet TAKE 1 TABLET (325 MG TOTAL) BY MOUTH 2 (TWO) TIMES DAILY WITH A  MEAL.  . furosemide (LASIX) 40 MG tablet TAKE 1 TABLET BY MOUTH EVERY DAY  . gabapentin (NEURONTIN) 100 MG capsule TAKE 1 CAPSULE BY MOUTH THREE TIMES A DAY  . losartan-hydrochlorothiazide (HYZAAR) 100-25 MG tablet Take 1 tablet by mouth daily.  . Multiple Vitamin (MULTIVITAMIN) tablet Take 1 tablet by mouth daily. Vitamin supplement.  Marland Kitchen omeprazole (PRILOSEC) 40 MG capsule TAKE ONE CAPSULE BY MOUTH EVERY DAY  . sertraline (ZOLOFT) 100 MG tablet TAKE 1 TABLET BY MOUTH DAILY  . [DISCONTINUED] gabapentin (NEURONTIN) 100 MG capsule TAKE 1 CAPSULE BY MOUTH THREE TIMES A DAY   Facility-Administered Encounter Medications as of 07/25/2017  Medication  . 0.9 %  sodium chloride infusion   Allergies  Allergen Reactions  . Metaxalone   . Morphine     Flushing, rash, itching  . Tape Other (See Comments)    Band aides, adhesive tape Redness and pulls skin off   Patient Active Problem List   Diagnosis Date Noted  . Periesophageal hiatal hernia 08/02/2016  . Dysphagia 05/10/2016  . Esophageal stenosis 05/10/2016  . Abdominal pain, epigastric 05/10/2016  . Generalized anxiety disorder 02/06/2012  . Major depressive disorder, recurrent (HCC) 02/05/2012  . Hypokalemia 11/09/2011  . Nausea and vomiting in adult 11/08/2011  . Dehydration 11/08/2011  . Tremors of nervous system 11/08/2011  . Hypothyroidism 05/10/2008  . Dyslipidemia 05/10/2008  . UNSPECIFIED ANEMIA 05/10/2008  . CAROTID ARTERY  DISEASE 05/10/2008  . Transient cerebral ischemia 05/10/2008  . Chronic back pain 05/10/2008  . OSTEOPENIA 05/10/2008  . ESOPHAGEAL STRICTURE 01/19/2008  . Essential hypertension 04/29/2007  . GERD 04/29/2007  . Osteoarthritis 04/29/2007  . SPONDYLOSIS 04/29/2007   Social History   Social History  . Marital status: Married    Spouse name: N/A  . Number of children: 2 D  . Years of education: N/A   Occupational History  .  Retired   Social History Main Topics  . Smoking status: Never Smoker    . Smokeless tobacco: Never Used  . Alcohol use No  . Drug use: No  . Sexual activity: Not Currently   Other Topics Concern  . Not on file   Social History Narrative  . No narrative on file    Ms. Lentz's family history includes Heart disease (age of onset: 64) in her father; Hypertension in her mother; Kidney disease in her mother; Suicidality in her son.      Objective:    Vitals:   07/25/17 1506  BP: 120/78  Pulse: 76    Physical Exam  well-developed elderly white female in no acute distress, accompanied by her sister, both pleasant blood pressure 120/78, pulse 76, BMI 30.1. HEENT; nontraumatic normocephalic EOMI PERRLA sclera anicteric, Cardiovascular; regular rate and rhythm with S1-S2 no murmur or gallop, Pulmonary; clear bilaterally, Abdomen; soft, bowel sounds are present she is tender in the right upper quadrant is no palpable mass or hepatosplenomegaly, no guarding or rebound bowel sounds are present, Rectal ;exam not done, Extremities ;no clubbing cyanosis or edema skin warm and dry, Neuropsych; mood and affect appropriate       Assessment & Plan:   #36 81 year old white female with multiple month history of right upper quadrant pain somewhat progressive, sharp and at times worse postprandially. Etiology of her pain is not clear, EGD in April 2018 showed no evidence of gastropathy or ulcer disease, ultrasound in 2017 showed no evidence of cholelithiasis. Rule out underlying malignancy, rule out pain secondary to adhesions, rule out gallbladder disease #2 iron deficiency anemia-colonoscopy had been scheduled after last office visit in June, but patient canceled #3 history of paraesophageal hernia, status post Nissen fundoplication 2017 #4 history of TIA #5 hypertension next line #6 hypothyroidism #7 anxiety/depression  Plan; we will reschedule for colonoscopy with Dr. [name]. Procedure discussed in detail with patient including risks and benefits and she is agreeable  to proceed. Check CBC, CMET Continue omeprazole 40 mg by mouth every morning Schedule for CT of the abdomen and pelvis with contrast. Further plans pending results of above.    Kalila Adkison Oswald Hillock PA-C 07/25/2017   Cc: Gordy Savers, MD

## 2017-07-25 NOTE — Patient Instructions (Signed)
Please go to the basement level to have your labs drawn.   If you are age 79 or older, your body mass index should be between 23-30. Your Body mass index is 30.11 kg/m. If this is out of the aforementioned range listed, please consider follow up with your Primary Care Provider.  You have been scheduled for a CT scan of the abdomen and pelvis at [facility] ([address]---this is in the same building as [facility]).   You are scheduled onTuesday 08-05-2017 at 1:00 PM. You should arrive at 12:45 PM  to your appointment time for registration. Please follow the written instructions below on the day of your exam:  WARNING: IF YOU ARE ALLERGIC TO IODINE/X-RAY DYE, PLEASE NOTIFY RADIOLOGY IMMEDIATELY AT [phone]! YOU WILL BE GIVEN A 13 HOUR PREMEDICATION PREP.  1) Do not eat or drink anything after 9:00 am (4 hours prior to your test) 2) You have been given 2 bottles of oral contrast to drink. The solution may taste               better if refrigerated, but do NOT add ice or any other liquid to this solution. Shake             well before drinking.    Drink 1 bottle of contrast @ 11:00 am (2 hours prior to your exam)  Drink 1 bottle of contrast @ 12:00 noon (1 hour prior to your exam)  You may take any medications as prescribed with a small amount of water except for the following: Metformin, Glucophage, Glucovance, Avandamet, Riomet, Fortamet, Actoplus Met, Janumet, Glumetza or Metaglip. The above medications must be held the day of the exam AND 48 hours after the exam.  The purpose of you drinking the oral contrast is to aid in the visualization of your intestinal tract. The contrast solution may cause some diarrhea. Before your exam is started, you will be given a small amount of fluid to drink. Depending on your individual set of symptoms, you may also receive an intravenous injection of x-ray contrast/dye. Plan on being at [facility] for 30 minutes or longer,  depending on the type of exam you are having performed.  This test typically takes 30-45 minutes to complete.  If you have any questions regarding your exam or if you need to reschedule, you may call the CT department at [phone] between the hours of 8:00 am and 5:00 pm, Monday-Friday.  ________________________________________________________________________

## 2017-07-26 NOTE — Progress Notes (Signed)
Assessment and plans noted ?

## 2017-07-29 ENCOUNTER — Encounter: Payer: Self-pay | Admitting: Internal Medicine

## 2017-07-29 ENCOUNTER — Telehealth: Payer: Self-pay | Admitting: *Deleted

## 2017-07-29 NOTE — Telephone Encounter (Signed)
LM for the patient on her home phone that I have a Suprep for the colonosopy at our front desk. I asked that she come to pick it up as soon as she can.

## 2017-07-30 ENCOUNTER — Other Ambulatory Visit: Payer: Self-pay | Admitting: Internal Medicine

## 2017-08-05 ENCOUNTER — Ambulatory Visit (INDEPENDENT_AMBULATORY_CARE_PROVIDER_SITE_OTHER)
Admission: RE | Admit: 2017-08-05 | Discharge: 2017-08-05 | Disposition: A | Discharge status: Home / Self Care | Payer: Medicare Other | Source: Ambulatory Visit | Attending: Physician Assistant | Admitting: Physician Assistant

## 2017-08-05 DIAGNOSIS — R1011 Right upper quadrant pain: Secondary | ICD-10-CM | POA: Diagnosis not present

## 2017-08-05 DIAGNOSIS — Z9889 Other specified postprocedural states: Secondary | ICD-10-CM

## 2017-08-05 DIAGNOSIS — D509 Iron deficiency anemia, unspecified: Secondary | ICD-10-CM

## 2017-08-05 DIAGNOSIS — R109 Unspecified abdominal pain: Secondary | ICD-10-CM | POA: Diagnosis not present

## 2017-08-05 MED ORDER — IOPAMIDOL (ISOVUE-300) INJECTION 61%
100.0000 mL | Freq: Once | INTRAVENOUS | Status: AC | PRN
  Administered 2017-08-05: 100 mL via INTRAVENOUS

## 2017-08-07 ENCOUNTER — Encounter: Payer: Self-pay | Admitting: Internal Medicine

## 2017-08-07 ENCOUNTER — Ambulatory Visit (AMBULATORY_SURGERY_CENTER): Payer: Medicare Other | Admitting: Internal Medicine

## 2017-08-07 VITALS — BP 168/79 | HR 68 | Temp 97.5°F | Resp 14 | Ht 62.0 in | Wt 164.0 lb

## 2017-08-07 DIAGNOSIS — R1011 Right upper quadrant pain: Secondary | ICD-10-CM

## 2017-08-07 DIAGNOSIS — F319 Bipolar disorder, unspecified: Secondary | ICD-10-CM | POA: Diagnosis not present

## 2017-08-07 DIAGNOSIS — M797 Fibromyalgia: Secondary | ICD-10-CM | POA: Diagnosis not present

## 2017-08-07 DIAGNOSIS — D509 Iron deficiency anemia, unspecified: Secondary | ICD-10-CM

## 2017-08-07 DIAGNOSIS — I10 Essential (primary) hypertension: Secondary | ICD-10-CM | POA: Diagnosis not present

## 2017-08-07 MED ORDER — SODIUM CHLORIDE 0.9 % IV SOLN: 500.0000 mL | INTRAVENOUS | Status: DC

## 2017-08-07 NOTE — Op Note (Signed)
[facility] Patient Name: Julie Rice Procedure Date: 08/07/2017 3:15 PM MRN: [id number] Endoscopist: Wilhemina Bonito. [name] , MD Age: 81 Referring MD:  Date of Birth: 02/12/1936 Gender: Female Account #: [id number] Procedure:                Colonoscopy Indications:              Abdominal pain in the right upper quadrant, Iron                            deficiency anemia. Previous examinations 2003 in                            2008 were negative Medicines:                Monitored Anesthesia Care Procedure:                Pre-Anesthesia Assessment:                           - Prior to the procedure, a History and Physical                            was performed, and patient medications and                            allergies were reviewed. The patient's tolerance of                            previous anesthesia was also reviewed. The risks                            and benefits of the procedure and the sedation                            options and risks were discussed with the patient.                            All questions were answered, and informed consent                            was obtained. Prior Anticoagulants: The patient has                            taken no previous anticoagulant or antiplatelet                            agents. ASA Grade Assessment: II - A patient with                            mild systemic disease. After reviewing the risks                            and benefits, the patient was deemed in  satisfactory condition to undergo the procedure.                           After obtaining informed consent, the colonoscope                            was passed under direct vision. Throughout the                            procedure, the patient's blood pressure, pulse, and                            oxygen saturations were monitored continuously. The                            Colonoscope was introduced through the anus  and                            advanced to the the cecum, identified by                            appendiceal orifice and ileocecal valve. The                            terminal ileum, ileocecal valve, appendiceal                            orifice, and rectum were photographed. The quality                            of the bowel preparation was excellent. The                            colonoscopy was performed without difficulty. The                            patient tolerated the procedure well. The bowel                            preparation used was SUPREP. Scope In: 3:35:37 PM Scope Out: 3:51:23 PM Scope Withdrawal Time: 0 hours 9 minutes 58 seconds  Total Procedure Duration: 0 hours 15 minutes 46 seconds  Findings:                 The terminal ileum appeared normal.                           Internal hemorrhoids were found during retroflexion.                           The exam was otherwise without abnormality on                            direct and retroflexion views. Complications:            No immediate complications.  Estimated blood loss:                            None. Estimated Blood Loss:     Estimated blood loss: none. Impression:               - The examined portion of the ileum was normal.                           - Internal hemorrhoids.                           - The examination was otherwise normal on direct                            and retroflexion views.                           - No specimens collected. Recommendation:           - Repeat colonoscopy is not recommended for                            surveillance.                           - Patient has a contact number available for                            emergencies. The signs and symptoms of potential                            delayed complications were discussed with the                            patient. Return to normal activities tomorrow.                            Written discharge  instructions were provided to the                            patient.                           - Resume previous diet.                           - Continue present medications. Wilhemina Bonito. [name], MD 08/07/2017 3:55:19 PM This report has been signed electronically.

## 2017-08-07 NOTE — Patient Instructions (Signed)
Discharge instructions given. Handout on hemorrhoids. Resume previous medications. YOU HAD AN ENDOSCOPIC PROCEDURE TODAY AT THE [facility]:   Refer to the procedure report that was given to you for any specific questions about what was found during the examination.  If the procedure report does not answer your questions, please call your gastroenterologist to clarify.  If you requested that your care partner not be given the details of your procedure findings, then the procedure report has been included in a sealed envelope for you to review at your convenience later.  YOU SHOULD EXPECT: Some feelings of bloating in the abdomen. Passage of more gas than usual.  Walking can help get rid of the air that was put into your GI tract during the procedure and reduce the bloating. If you had a lower endoscopy (such as a colonoscopy or flexible sigmoidoscopy) you may notice spotting of blood in your stool or on the toilet paper. If you underwent a bowel prep for your procedure, you may not have a normal bowel movement for a few days.  Please Note:  You might notice some irritation and congestion in your nose or some drainage.  This is from the oxygen used during your procedure.  There is no need for concern and it should clear up in a day or so.  SYMPTOMS TO REPORT IMMEDIATELY:   Following lower endoscopy (colonoscopy or flexible sigmoidoscopy):  Excessive amounts of blood in the stool  Significant tenderness or worsening of abdominal pains  Swelling of the abdomen that is new, acute  Fever of 100F or higher   For urgent or emergent issues, a gastroenterologist can be reached at any hour by calling [phone].   DIET:  We do recommend a small meal at first, but then you may proceed to your regular diet.  Drink plenty of fluids but you should avoid alcoholic beverages for 24 hours.  ACTIVITY:  You should plan to take it easy for the rest of today and you should NOT DRIVE or use heavy  machinery until tomorrow (because of the sedation medicines used during the test).    FOLLOW UP: Our staff will call the number listed on your records the next business day following your procedure to check on you and address any questions or concerns that you may have regarding the information given to you following your procedure. If we do not reach you, we will leave a message.  However, if you are feeling well and you are not experiencing any problems, there is no need to return our call.  We will assume that you have returned to your regular daily activities without incident.  If any biopsies were taken you will be contacted by phone or by letter within the next 1-3 weeks.  Please call us at [phone] if you have not heard about the biopsies in 3 weeks.    SIGNATURES/CONFIDENTIALITY: You and/or your care partner have signed paperwork which will be entered into your electronic medical record.  These signatures attest to the fact that that the information above on your After Visit Summary has been reviewed and is understood.  Full responsibility of the confidentiality of this discharge information lies with you and/or your care-partner. 

## 2017-08-07 NOTE — Progress Notes (Signed)
Report to PACU, RN, vss, BBS= Clear.  

## 2017-08-11 ENCOUNTER — Other Ambulatory Visit: Payer: Self-pay | Admitting: Internal Medicine

## 2017-08-23 ENCOUNTER — Other Ambulatory Visit: Payer: Self-pay | Admitting: Internal Medicine

## 2017-08-30 ENCOUNTER — Other Ambulatory Visit: Payer: Self-pay | Admitting: Internal Medicine

## 2017-10-07 ENCOUNTER — Other Ambulatory Visit: Payer: Self-pay | Admitting: Internal Medicine

## 2017-10-18 ENCOUNTER — Other Ambulatory Visit: Payer: Self-pay | Admitting: Internal Medicine

## 2017-10-24 ENCOUNTER — Other Ambulatory Visit: Payer: Self-pay | Admitting: Internal Medicine

## 2017-11-11 ENCOUNTER — Encounter: Payer: Self-pay | Admitting: Internal Medicine

## 2017-11-17 ENCOUNTER — Other Ambulatory Visit: Payer: Self-pay | Admitting: Internal Medicine

## 2017-11-20 ENCOUNTER — Encounter: Payer: Self-pay | Admitting: Internal Medicine

## 2017-11-20 ENCOUNTER — Ambulatory Visit: Payer: Medicare Other | Admitting: Internal Medicine

## 2017-11-20 VITALS — BP 144/68 | HR 82 | Temp 97.3°F | Ht 62.0 in | Wt 164.6 lb

## 2017-11-20 DIAGNOSIS — F411 Generalized anxiety disorder: Secondary | ICD-10-CM

## 2017-11-20 DIAGNOSIS — I10 Essential (primary) hypertension: Secondary | ICD-10-CM

## 2017-11-20 DIAGNOSIS — R1013 Epigastric pain: Secondary | ICD-10-CM

## 2017-11-20 DIAGNOSIS — G8929 Other chronic pain: Secondary | ICD-10-CM

## 2017-11-20 DIAGNOSIS — E039 Hypothyroidism, unspecified: Secondary | ICD-10-CM | POA: Diagnosis not present

## 2017-11-20 DIAGNOSIS — M549 Dorsalgia, unspecified: Secondary | ICD-10-CM

## 2017-11-20 NOTE — Progress Notes (Signed)
Subjective:    Patient ID: [name], female    DOB: 10/26/1936, 82 y.o.   MRN: [id number]  HPI  82 year old patient who is seen today in follow-up.  She has a history of chronic anxiety disorder hypothyroidism and essential hypertension. She has been followed by GI due to chronic abdominal pain.  She has no GI complaints today.  Her main complaint is cough that she states bothers her from 2:30 in the morning till about 4 AM.  Cough is nonproductive.  Denies any postnasal drip.  She is on PPI therapy for chronic reflux. She has hypothyroidism.  Past Medical History:  Diagnosis Date  . Anemia   . Anxiety   . Arthritis    ra and oa  . Bipolar disorder (HCC)   . Blood transfusion 1991   autologous pts own blood given   . CAD (coronary artery disease)   . Chest pain    "@ rest, lying down, w/exertion"  . COPD (chronic obstructive pulmonary disease) (HCC)   . Depression   . Dyspnea   . Esophageal stricture   . Fecal occult blood test positive   . Fibromyalgia   . GERD (gastroesophageal reflux disease)   . Hiatal hernia   . Hyperlipidemia   . Hypertension   . Internal hemorrhoids   . Lumbago   . Mitral regurgitation   . PONV (postoperative nausea and vomiting)    severe ponv, did ok with last surgery  . Rectal bleeding   . SOB (shortness of breath)    with exertion  . Spondylosis   . TIA (transient ischemic attack) 2013  . Urinary incontinence    wears depends     Social History   Socioeconomic History  . Marital status: Married    Spouse name: Not on file  . Number of children: 2 D  . Years of education: Not on file  . Highest education level: Not on file  Social Needs  . Financial resource strain: Not on file  . Food insecurity - worry: Not on file  . Food insecurity - inability: Not on file  . Transportation needs - medical: Not on file  . Transportation needs - non-medical: Not on file  Occupational History    Employer: RETIRED  Tobacco Use  .  Smoking status: Never Smoker  . Smokeless tobacco: Never Used  Substance and Sexual Activity  . Alcohol use: No  . Drug use: No  . Sexual activity: Not Currently  Other Topics Concern  . Not on file  Social History Narrative  . Not on file    Past Surgical History:  Procedure Laterality Date  . ABDOMINAL HYSTERECTOMY  1982  . BLADDER SUSPENSION  1980's  . CATARACT EXTRACTION W/ INTRAOCULAR LENS  IMPLANT, BILATERAL Bilateral 2010  . DILATION AND CURETTAGE OF UTERUS  1961  . HIATAL HERNIA REPAIR N/A 08/02/2016   Procedure: LAPAROSCOPIC REPAIR OF LARGE  HIATAL HERNIA;  Surgeon: Glenna Fellows, MD;  Location: [facility];  Service: General;  Laterality: N/A;  . LAPAROSCOPIC NISSEN FUNDOPLICATION N/A 08/02/2016   Procedure: LAPAROSCOPIC NISSEN FUNDOPLICATION;  Surgeon: Glenna Fellows, MD;  Location: [facility];  Service: General;  Laterality: N/A;  . LUMBAR LAMINECTOMY  1990; 1994; [id number]   "I've got 2 stainless steel rods; 6 screws; 2 ray cages"took bone from right hip to put in back  . NISSEN FUNDOPLICATION  10/2016  . TONSILLECTOMY AND ADENOIDECTOMY  1945  . TOTAL KNEE ARTHROPLASTY Right ~ 1996  . TUBAL  LIGATION  ~ 1976    Family History  Problem Relation Age of Onset  . Kidney disease Mother   . Hypertension Mother   . Heart disease Father 69       MI  . Suicidality Son   . Colon cancer Neg Hx   . Esophageal cancer Neg Hx   . Pancreatic cancer Neg Hx     Allergies  Allergen Reactions  . Metaxalone   . Morphine     Flushing, rash, itching  . Tape Other (See Comments)    Band aides, adhesive tape Redness and pulls skin off    Current Outpatient Medications on File Prior to Visit  Medication Sig Dispense Refill  . aspirin 81 MG tablet Take 1 tablet (81 mg total) by mouth daily. For cardiovascular health.    . clonazePAM (KLONOPIN) 0.5 MG tablet TAKE 1 TABLET BY MOUTH THREE TIMES A DAY AS NEEDED 270 tablet 0  . ferrous sulfate 325 (65 FE) MG tablet TAKE 1 TABLET (325  MG TOTAL) BY MOUTH 2 (TWO) TIMES DAILY WITH A MEAL. 60 tablet 6  . furosemide (LASIX) 40 MG tablet TAKE 1 TABLET BY MOUTH EVERY DAY 30 tablet 0  . gabapentin (NEURONTIN) 100 MG capsule TAKE 1 CAPSULE BY MOUTH THREE TIMES A DAY 270 capsule 0  . losartan-hydrochlorothiazide (HYZAAR) 100-25 MG tablet Take 1 tablet by mouth daily. 90 tablet 3  . meloxicam (MOBIC) 15 MG tablet TAKE 1 TABLET BY MOUTH EVERY DAY 90 tablet 0  . Multiple Vitamin (MULTIVITAMIN) tablet Take 1 tablet by mouth daily. Vitamin supplement.    Marland Kitchen omeprazole (PRILOSEC) 40 MG capsule TAKE 1 CAPSULE BY MOUTH EVERY DAY 90 capsule 1  . sertraline (ZOLOFT) 100 MG tablet TAKE 1 TABLET BY MOUTH EVERY DAY 90 tablet 0   Current Facility-Administered Medications on File Prior to Visit  Medication Dose Route Frequency Provider Last Rate Last Dose  . 0.9 %  sodium chloride infusion  500 mL Intravenous Continuous Hilarie Fredrickson, MD        BP (!) 144/68 (BP Location: Left Arm, Patient Position: Sitting, Cuff Size: Normal)   Pulse 82   Temp (!) 97.3 F (36.3 C) (Oral)   Ht 5\' 2"  (1.575 m)   Wt 164 lb 9.6 oz (74.7 kg)   SpO2 95%   BMI 30.11 kg/m     Review of Systems  Constitutional: Negative.   HENT: Negative for congestion, dental problem, hearing loss, rhinorrhea, sinus pressure, sore throat and tinnitus.   Eyes: Negative for pain, discharge and visual disturbance.  Respiratory: Positive for cough. Negative for shortness of breath.   Cardiovascular: Negative for chest pain, palpitations and leg swelling.  Gastrointestinal: Negative for abdominal distention, abdominal pain, blood in stool, constipation, diarrhea, nausea and vomiting.  Genitourinary: Negative for difficulty urinating, dysuria, flank pain, frequency, hematuria, pelvic pain, urgency, vaginal bleeding, vaginal discharge and vaginal pain.  Musculoskeletal: Positive for arthralgias and back pain. Negative for gait problem and joint swelling.  Skin: Negative for rash.    Neurological: Negative for dizziness, syncope, speech difficulty, weakness, numbness and headaches.  Hematological: Negative for adenopathy.  Psychiatric/Behavioral: Negative for agitation, behavioral problems and dysphoric mood. The patient is not nervous/anxious.        Objective:   Physical Exam  Constitutional: She is oriented to person, place, and time. She appears well-developed and well-nourished.  HENT:  Head: Normocephalic.  Right Ear: External ear normal.  Left Ear: External ear normal.  Mouth/Throat: Oropharynx is  clear and moist.  Eyes: Conjunctivae and EOM are normal. Pupils are equal, round, and reactive to light.  Neck: Normal range of motion. Neck supple. No thyromegaly present.  Cardiovascular: Normal rate, regular rhythm, normal heart sounds and intact distal pulses.  Pulmonary/Chest: Effort normal. She has rales.  Abdominal: Soft. Bowel sounds are normal. She exhibits no mass. There is no tenderness.  Musculoskeletal: Normal range of motion.  Lymphadenopathy:    She has no cervical adenopathy.  Neurological: She is alert and oriented to person, place, and time.  Skin: Skin is warm and dry. No rash noted.  Psychiatric: She has a normal mood and affect. Her behavior is normal.          Assessment & Plan:   Essential hypertension controlled Hypothyroidism.  Will check TSH next visit History of anxiety depression Gastroesophageal reflux disease continue PPI therapy  Schedule CPX Medications updated  Rogelia Boga

## 2017-11-20 NOTE — Patient Instructions (Signed)
Limit your sodium (Salt) intake  Please check your blood pressure on a regular basis.  If it is consistently greater than 150/90, please make an office appointment.  Return in 6 months for follow-up   

## 2017-11-26 ENCOUNTER — Encounter: Payer: Self-pay | Admitting: Internal Medicine

## 2017-11-27 ENCOUNTER — Other Ambulatory Visit: Payer: Self-pay | Admitting: Internal Medicine

## 2017-11-27 MED ORDER — AMLODIPINE BESYLATE 2.5 MG PO TABS: 2.5000 mg | ORAL_TABLET | Freq: Every day | ORAL | 3 refills | Status: DC

## 2017-12-18 ENCOUNTER — Other Ambulatory Visit: Payer: Self-pay | Admitting: Internal Medicine

## 2017-12-22 ENCOUNTER — Ambulatory Visit: Payer: Medicare Other | Admitting: Family Medicine

## 2017-12-22 ENCOUNTER — Ambulatory Visit (INDEPENDENT_AMBULATORY_CARE_PROVIDER_SITE_OTHER)
Admission: RE | Admit: 2017-12-22 | Discharge: 2017-12-22 | Disposition: A | Discharge status: Home / Self Care | Payer: Medicare Other | Source: Ambulatory Visit | Attending: Family Medicine | Admitting: Family Medicine

## 2017-12-22 ENCOUNTER — Encounter: Payer: Self-pay | Admitting: Family Medicine

## 2017-12-22 VITALS — BP 108/60 | HR 88 | Temp 99.0°F | Resp 16 | Ht 62.0 in | Wt 164.2 lb

## 2017-12-22 DIAGNOSIS — R053 Chronic cough: Secondary | ICD-10-CM

## 2017-12-22 DIAGNOSIS — R05 Cough: Secondary | ICD-10-CM | POA: Diagnosis not present

## 2017-12-22 DIAGNOSIS — Z01812 Encounter for preprocedural laboratory examination: Secondary | ICD-10-CM

## 2017-12-22 DIAGNOSIS — N644 Mastodynia: Secondary | ICD-10-CM

## 2017-12-22 DIAGNOSIS — R0602 Shortness of breath: Secondary | ICD-10-CM | POA: Diagnosis not present

## 2017-12-22 DIAGNOSIS — K219 Gastro-esophageal reflux disease without esophagitis: Secondary | ICD-10-CM

## 2017-12-22 DIAGNOSIS — R079 Chest pain, unspecified: Secondary | ICD-10-CM | POA: Diagnosis not present

## 2017-12-22 DIAGNOSIS — I10 Essential (primary) hypertension: Secondary | ICD-10-CM

## 2017-12-22 MED ORDER — PANTOPRAZOLE SODIUM 20 MG PO TBEC: 20.0000 mg | DELAYED_RELEASE_TABLET | Freq: Every day | ORAL | 2 refills | Status: DC

## 2017-12-22 NOTE — Progress Notes (Signed)
ACUTE VISIT   HPI:  Chief Complaint  Patient presents with  . Pain behind right breast    started late Friday night, early Saturday morning, also had a cough  . Headache    [name] is a 82 y.o. female, who is here today complaining of pain "behind" right breast that started Saturday at 3:30 Am. She took BP and it was low at "45/43",frontal headache.  Denies visual changes,dyspnea,or MS changes.  She has Hx of HTN, held Amlodipine yesterday and today. Still taking Losartan-HCTZ 100-25 mg.    Pain is sharp,moderate and constant. Pain is exacerbated by RUE movement,cough,and palpation. Alleviated by applying local heat and applying pressure on chest with a pillow.  She has not noted skin changes,breast masses,or nipple discharge. She denies recent injuries. She has not taken OTC analgesics.  She is reporting history of fibromyalgia. Mammogram 2 years ago,per pt report.  She also mentions non productive cough,which she has had for a while. Hx of GERD, + heartburn,exacerbated by certain food. No alleviating factors identified.  Cough usually wakes her up around 3-5 Am every day, feels burning in her throat. She is currently on Omeprazole 40 mg daily,she was on Nexium before but her insurance stopped covering it. S/P hiatal hernia repair.   Denies abdominal pain, nausea, vomiting, changes in bowel habits,or melena.    Review of Systems  Constitutional: Positive for fatigue. Negative for activity change, appetite change and fever.  HENT: Negative for mouth sores, nosebleeds and trouble swallowing.   Eyes: Negative for redness and visual disturbance.  Respiratory: Positive for cough. Negative for shortness of breath and wheezing.   Cardiovascular: Negative for palpitations and leg swelling.  Gastrointestinal: Negative for abdominal pain, nausea and vomiting.       Negative for changes in bowel habits.  Endocrine: Negative for cold intolerance and heat  intolerance.  Genitourinary: Negative for decreased urine volume and hematuria.  Musculoskeletal: Positive for myalgias. Negative for gait problem.  Skin: Negative for rash and wound.  Neurological: Positive for headaches. Negative for syncope and weakness.  Psychiatric/Behavioral: Negative for confusion. The patient is nervous/anxious.       Current Outpatient Medications on File Prior to Visit  Medication Sig Dispense Refill  . amLODipine (NORVASC) 2.5 MG tablet Take 1 tablet (2.5 mg total) by mouth daily. 90 tablet 3  . aspirin 81 MG tablet Take 1 tablet (81 mg total) by mouth daily. For cardiovascular health.    . clonazePAM (KLONOPIN) 0.5 MG tablet TAKE 1 TABLET BY MOUTH THREE TIMES A DAY AS NEEDED 270 tablet 0  . furosemide (LASIX) 40 MG tablet TAKE 1 TABLET BY MOUTH EVERY DAY 30 tablet 0  . gabapentin (NEURONTIN) 100 MG capsule TAKE 1 CAPSULE BY MOUTH THREE TIMES A DAY 270 capsule 0  . losartan-hydrochlorothiazide (HYZAAR) 100-25 MG tablet Take 1 tablet by mouth daily. 90 tablet 3  . Multiple Vitamin (MULTIVITAMIN) tablet Take 1 tablet by mouth daily. Vitamin supplement.    . sertraline (ZOLOFT) 100 MG tablet TAKE 1 TABLET BY MOUTH EVERY DAY 90 tablet 0   Current Facility-Administered Medications on File Prior to Visit  Medication Dose Route Frequency Provider Last Rate Last Dose  . 0.9 %  sodium chloride infusion  500 mL Intravenous Continuous Hilarie Fredrickson, MD         Past Medical History:  Diagnosis Date  . Anemia   . Anxiety   . Arthritis    ra and oa  .  Bipolar disorder (HCC)   . Blood transfusion 1991   autologous pts own blood given   . CAD (coronary artery disease)   . Chest pain    "@ rest, lying down, w/exertion"  . COPD (chronic obstructive pulmonary disease) (HCC)   . Depression   . Dyspnea   . Esophageal stricture   . Fecal occult blood test positive   . Fibromyalgia   . GERD (gastroesophageal reflux disease)   . Hiatal hernia   . Hyperlipidemia   .  Hypertension   . Internal hemorrhoids   . Lumbago   . Mitral regurgitation   . PONV (postoperative nausea and vomiting)    severe ponv, did ok with last surgery  . Rectal bleeding   . SOB (shortness of breath)    with exertion  . Spondylosis   . TIA (transient ischemic attack) 2013  . Urinary incontinence    wears depends   Allergies  Allergen Reactions  . Metaxalone   . Morphine     Flushing, rash, itching  . Tape Other (See Comments)    Band aides, adhesive tape Redness and pulls skin off    Social History   Socioeconomic History  . Marital status: Married    Spouse name: None  . Number of children: 2 D  . Years of education: None  . Highest education level: None  Social Needs  . Financial resource strain: None  . Food insecurity - worry: None  . Food insecurity - inability: None  . Transportation needs - medical: None  . Transportation needs - non-medical: None  Occupational History    Employer: RETIRED  Tobacco Use  . Smoking status: Never Smoker  . Smokeless tobacco: Never Used  Substance and Sexual Activity  . Alcohol use: No  . Drug use: No  . Sexual activity: Not Currently  Other Topics Concern  . None  Social History Narrative  . None    Vitals:   12/22/17 1524  BP: 108/60  Pulse: 88  Resp: 16  Temp: 99 F (37.2 C)  SpO2: 94%   Body mass index is 30.04 kg/m.    Physical Exam  Nursing note and vitals reviewed. Constitutional: She is oriented to person, place, and time. She appears well-developed. No distress.  HENT:  Head: Normocephalic and atraumatic.  Mouth/Throat: Oropharynx is clear and moist and mucous membranes are normal.  Eyes: Conjunctivae are normal. Pupils are equal, round, and reactive to light.  Cardiovascular: Normal rate and regular rhythm.  Murmur (? SEM LUSB) heard. Pulses:      Dorsalis pedis pulses are 2+ on the right side, and 2+ on the left side.  Respiratory: Effort normal and breath sounds normal. No  respiratory distress. She exhibits tenderness.  GI: Soft. She exhibits no mass. There is no hepatomegaly. There is tenderness in the epigastric area. There is no rigidity, no rebound and no guarding.  Genitourinary:  Genitourinary Comments: Breast: No masses or tenderness,no nipple discharge or skin changes bilateral.   Musculoskeletal: She exhibits no edema.  Lymphadenopathy:    She has no cervical adenopathy.    She has no axillary adenopathy.  Neurological: She is alert and oriented to person, place, and time. She has normal strength.  Otherwise stable gait with no assistance.  Skin: Skin is warm. No rash noted. No erythema.  Psychiatric: Her mood appears anxious.  Well groomed, good eye contact.    ASSESSMENT AND PLAN:   Ms.Letti was seen today for pain behind right breast  and headache.  Diagnoses and all orders for this visit:  Breast pain, right  Breast examination otherwise negative. Pain seems to be on chest wall behind breast but since it has been 2 years without mammogram, Dx mammogram will be arranged.  -     MM Digital Diagnostic Bilat; Future  Persistent cough for 3 weeks or longer  Seems chronic. Possible causes discussed: GERD,allergies among some. Further recommendations will be given according to imaging results.  -     DG Chest 2 View; Future  Gastroesophageal reflux disease, esophagitis presence not specified  GERD precautions discussed. Stop Omeprazole and start Protonix 20 mg bid. Instructed about warning signs. F/U with PCP in 3-4 weeks.  -     pantoprazole (PROTONIX) 20 MG tablet; Take 1 tablet (20 mg total) by mouth daily.  Essential hypertension  BP today in lower normal range. Continue Losartan-HCTZ 100-25 mg daily. Continue monitoring BP at home. Follow up with PCP in 3-4 weeks.     Return in about 4 weeks (around 01/19/2018) for PCP.     -Ms.Melah C Sparling was advised to seek immediate medical attention if sudden worsening  symptoms.      Erhard Senske G. [place], MD  [facility]. [place] office.

## 2017-12-22 NOTE — Patient Instructions (Signed)
  Julie Rice I have seen you today for an acute visit.  A few things to remember from today's visit:   Breast pain, right - Plan: MM Digital Diagnostic Bilat  Persistent cough for 3 weeks or longer - Plan: DG Chest 2 View  Gastroesophageal reflux disease, esophagitis presence not specified - Plan: pantoprazole (PROTONIX) 20 MG tablet   Medications prescribed today are intended for short period of time and will not be refill upon request, a follow up appointment might be necessary to discuss continuation of of treatment if appropriate.  Today X ray was ordered.  This can be done at [facility] at [facility] between 8 am and 5 pm: [address] [place]. [phone].   Stop Omeprazole. Start Protonix. Follow up with PCP in 3-4 weeks.     In general please monitor for signs of worsening symptoms and seek immediate medical attention if any concerning.  If symptoms are not resolved in a few days/weeks you should schedule a follow up appointment with your doctor, before if needed.  I hope you get better soon!

## 2017-12-23 ENCOUNTER — Other Ambulatory Visit: Payer: Self-pay | Admitting: Family Medicine

## 2017-12-23 DIAGNOSIS — R9389 Abnormal findings on diagnostic imaging of other specified body structures: Secondary | ICD-10-CM

## 2017-12-25 ENCOUNTER — Telehealth: Payer: Self-pay | Admitting: Family Medicine

## 2017-12-25 ENCOUNTER — Ambulatory Visit (INDEPENDENT_AMBULATORY_CARE_PROVIDER_SITE_OTHER)
Admission: RE | Admit: 2017-12-25 | Discharge: 2017-12-25 | Disposition: A | Discharge status: Home / Self Care | Payer: Medicare Other | Source: Ambulatory Visit | Attending: Family Medicine | Admitting: Family Medicine

## 2017-12-25 ENCOUNTER — Other Ambulatory Visit: Payer: Self-pay | Admitting: Internal Medicine

## 2017-12-25 ENCOUNTER — Other Ambulatory Visit: Payer: Self-pay

## 2017-12-25 ENCOUNTER — Other Ambulatory Visit (INDEPENDENT_AMBULATORY_CARE_PROVIDER_SITE_OTHER): Payer: Medicare Other

## 2017-12-25 DIAGNOSIS — R9389 Abnormal findings on diagnostic imaging of other specified body structures: Secondary | ICD-10-CM | POA: Diagnosis not present

## 2017-12-25 DIAGNOSIS — Z01812 Encounter for preprocedural laboratory examination: Secondary | ICD-10-CM

## 2017-12-25 DIAGNOSIS — J851 Abscess of lung with pneumonia: Secondary | ICD-10-CM

## 2017-12-25 DIAGNOSIS — R0602 Shortness of breath: Secondary | ICD-10-CM | POA: Diagnosis not present

## 2017-12-25 LAB — BASIC METABOLIC PANEL
BUN: 25 mg/dL — AB (ref 6–23)
CHLORIDE: 95 meq/L — AB (ref 96–112)
CO2: 35 mEq/L — ABNORMAL HIGH (ref 19–32)
Calcium: 9.2 mg/dL (ref 8.4–10.5)
Creatinine, Ser: 0.99 mg/dL (ref 0.40–1.20)
GFR: 57.37 mL/min — AB (ref >60.00)
Glucose, Bld: 111 mg/dL — ABNORMAL HIGH (ref 70–99)
POTASSIUM: 3.5 meq/L (ref 3.5–5.1)
SODIUM: 137 meq/L (ref 135–145)

## 2017-12-25 MED ORDER — IOPAMIDOL (ISOVUE-370) INJECTION 76%
80.0000 mL | Freq: Once | INTRAVENOUS | Status: AC | PRN
  Administered 2017-12-25: 80 mL via INTRAVENOUS

## 2017-12-25 MED ORDER — AMOXICILLIN-POT CLAVULANATE 875-125 MG PO TABS
1.0000 | ORAL_TABLET | Freq: Two times a day (BID) | ORAL | 0 refills | Status: DC
Start: 2017-12-25 — End: 2017-12-31

## 2017-12-25 NOTE — Addendum Note (Signed)
Addended by: Charna Elizabeth on: 12/25/2017 11:07 AM   Modules accepted: Orders

## 2017-12-25 NOTE — Telephone Encounter (Signed)
Call report for CT results, printed and gave to Dr. Kirtland Bouchard for review. Per Dr. Kirtland Bouchard advise that patient will have a script to pick up at pharmacy this afternoon, follow up with our office on Monday 12/29/2017 and someone will be in touch to go over results. I spoke with Misty Stanley in CT and she will give pt this information and advice, pt is still there at CT.

## 2017-12-25 NOTE — Addendum Note (Signed)
Addended by: Charna Elizabeth on: 12/25/2017 11:05 AM   Modules accepted: Orders

## 2017-12-26 ENCOUNTER — Telehealth: Payer: Self-pay

## 2017-12-26 NOTE — Telephone Encounter (Signed)
Spoke to patient per [facility] and informed to inform patient of CT results. Patient verbalized understanding. I told patient if she needed to be see today I could find her an appointment. Patient declined being seen today and stated she started the antibiotics the night before at 5 and is about to take her 2nd dose today. Patient will follow-up Friday with PCP.

## 2017-12-29 ENCOUNTER — Encounter: Payer: Self-pay | Admitting: Internal Medicine

## 2017-12-29 ENCOUNTER — Ambulatory Visit: Payer: Medicare Other | Admitting: Internal Medicine

## 2017-12-29 ENCOUNTER — Ambulatory Visit: Payer: Self-pay | Admitting: Family Medicine

## 2017-12-29 VITALS — BP 102/60 | HR 103 | Temp 98.2°F | Wt 162.0 lb

## 2017-12-29 DIAGNOSIS — J181 Lobar pneumonia, unspecified organism: Secondary | ICD-10-CM

## 2017-12-29 DIAGNOSIS — J189 Pneumonia, unspecified organism: Secondary | ICD-10-CM

## 2017-12-29 NOTE — Patient Instructions (Signed)
Take your antibiotic as prescribed until ALL of it is gone, but stop if you develop a rash, swelling, or any side effects of the medication.  Contact our office as soon as possible if  there are side effects of the medication.    Pulmonary consultation as discussed  Report any new or worsening symptoms such as increasing chest pain shortness of breath or fever

## 2017-12-29 NOTE — Progress Notes (Signed)
Subjective:    Patient ID: Julie Rice, female    DOB: 01-07-1936, 82 y.o.   MRN: [id number]  HPI  82 year old patient who is seen for routine follow-up on January 17.  At that time she complained of a nonproductive cough. She has a prior history of a hiatal hernia with stricture and has been on chronic PPI therapy.  She has a history of nausea and vomiting in the past but no recent episodes.  She is a lifelong smoker She was seen on February 18 complaining of cough and right-sided chest and breast pain.  A chest x-ray was obtained in the following day and CT a of the chest obtained 4 days ago:    IMPRESSION: 1. No evidence of pulmonary embolism. 2. Necrotizing pneumonia/lung abscess versus necrotic mass involving the lateral right upper lobe. As there is patchy pneumonia inferior and superior to the dense masslike consolidation, necrotizing pneumonia/lung access is favored. However, follow-up imaging after treatment is recommended to confirm resolution. 3. Lymphadenopathy involving the right hilum and the mediastinum, likely reactive. 4. Very small nodules in the right upper lobe and superior segment right lower lobe, the largest in the superior segment right lower lobe measuring approximately 5 mm. Follow-up of these nodules will depend on their presence or absence after treatment. 5. Mild cardiomegaly.  Moderate coronary atherosclerosis. 6. Old granulomatous disease.  The patient was placed on Augmentin on February 21 which she continues to take and tolerate.  She still complains of cough but in general feels much improved although still weak.  Cough remains nonproductive.  Past Medical History:  Diagnosis Date  . Anemia   . Anxiety   . Arthritis    ra and oa  . Bipolar disorder (HCC)   . Blood transfusion 1991   autologous pts own blood given   . CAD (coronary artery disease)   . Chest pain    "@ rest, lying down, w/exertion"  . COPD (chronic obstructive pulmonary  disease) (HCC)   . Depression   . Dyspnea   . Esophageal stricture   . Fecal occult blood test positive   . Fibromyalgia   . GERD (gastroesophageal reflux disease)   . Hiatal hernia   . Hyperlipidemia   . Hypertension   . Internal hemorrhoids   . Lumbago   . Mitral regurgitation   . PONV (postoperative nausea and vomiting)    severe ponv, did ok with last surgery  . Rectal bleeding   . SOB (shortness of breath)    with exertion  . Spondylosis   . TIA (transient ischemic attack) 2013  . Urinary incontinence    wears depends     Social History   Socioeconomic History  . Marital status: Widowed    Spouse name: Not on file  . Number of children: 2 D  . Years of education: Not on file  . Highest education level: Not on file  Social Needs  . Financial resource strain: Not on file  . Food insecurity - worry: Not on file  . Food insecurity - inability: Not on file  . Transportation needs - medical: Not on file  . Transportation needs - non-medical: Not on file  Occupational History    Employer: RETIRED  Tobacco Use  . Smoking status: Never Smoker  . Smokeless tobacco: Never Used  Substance and Sexual Activity  . Alcohol use: No  . Drug use: No  . Sexual activity: Not Currently  Other Topics Concern  . Not on  file  Social History Narrative  . Not on file    Past Surgical History:  Procedure Laterality Date  . ABDOMINAL HYSTERECTOMY  1982  . BLADDER SUSPENSION  1980's  . CATARACT EXTRACTION W/ INTRAOCULAR LENS  IMPLANT, BILATERAL Bilateral 2010  . DILATION AND CURETTAGE OF UTERUS  1961  . HIATAL HERNIA REPAIR N/A 08/02/2016   Procedure: LAPAROSCOPIC REPAIR OF LARGE  HIATAL HERNIA;  Surgeon: Glenna Fellows, MD;  Location: [facility];  Service: General;  Laterality: N/A;  . LAPAROSCOPIC NISSEN FUNDOPLICATION N/A 08/02/2016   Procedure: LAPAROSCOPIC NISSEN FUNDOPLICATION;  Surgeon: Glenna Fellows, MD;  Location: [facility];  Service: General;  Laterality: N/A;  . LUMBAR  LAMINECTOMY  1990; 1994; [id number]   "I've got 2 stainless steel rods; 6 screws; 2 ray cages"took bone from right hip to put in back  . NISSEN FUNDOPLICATION  10/2016  . TONSILLECTOMY AND ADENOIDECTOMY  1945  . TOTAL KNEE ARTHROPLASTY Right ~ 1996  . TUBAL LIGATION  ~ 1976    Family History  Problem Relation Age of Onset  . Kidney disease Mother   . Hypertension Mother   . Heart disease Father 104       MI  . Suicidality Son   . Colon cancer Neg Hx   . Esophageal cancer Neg Hx   . Pancreatic cancer Neg Hx     Allergies  Allergen Reactions  . Metaxalone   . Morphine     Flushing, rash, itching  . Tape Other (See Comments)    Band aides, adhesive tape Redness and pulls skin off    Current Outpatient Medications on File Prior to Visit  Medication Sig Dispense Refill  . amLODipine (NORVASC) 2.5 MG tablet Take 1 tablet (2.5 mg total) by mouth daily. 90 tablet 3  . amoxicillin-clavulanate (AUGMENTIN) 875-125 MG tablet Take 1 tablet by mouth 2 (two) times daily. 20 tablet 0  . aspirin 81 MG tablet Take 1 tablet (81 mg total) by mouth daily. For cardiovascular health.    . clonazePAM (KLONOPIN) 0.5 MG tablet TAKE 1 TABLET BY MOUTH THREE TIMES A DAY AS NEEDED 270 tablet 0  . furosemide (LASIX) 40 MG tablet TAKE 1 TABLET BY MOUTH EVERY DAY 30 tablet 0  . gabapentin (NEURONTIN) 100 MG capsule TAKE 1 CAPSULE BY MOUTH THREE TIMES A DAY 270 capsule 0  . losartan-hydrochlorothiazide (HYZAAR) 100-25 MG tablet Take 1 tablet by mouth daily. 90 tablet 3  . Multiple Vitamin (MULTIVITAMIN) tablet Take 1 tablet by mouth daily. Vitamin supplement.    . pantoprazole (PROTONIX) 20 MG tablet Take 1 tablet (20 mg total) by mouth daily. 60 tablet 2  . sertraline (ZOLOFT) 100 MG tablet TAKE 1 TABLET BY MOUTH EVERY DAY 90 tablet 0   Current Facility-Administered Medications on File Prior to Visit  Medication Dose Route Frequency Provider Last Rate Last Dose  . 0.9 %  sodium chloride infusion  500 mL  Intravenous Continuous Hilarie Fredrickson, MD        BP 102/60 (BP Location: Left Arm, Patient Position: Sitting, Cuff Size: Large)   Pulse (!) 103   Temp 98.2 F (36.8 C) (Oral)   Wt 162 lb (73.5 kg)   SpO2 98%   BMI 29.63 kg/m      Review of Systems  Constitutional: Positive for activity change, appetite change and fatigue. Negative for chills and fever.  HENT: Negative for congestion, dental problem, hearing loss, rhinorrhea, sinus pressure, sore throat and tinnitus.   Eyes: Negative for  pain, discharge and visual disturbance.  Respiratory: Positive for cough. Negative for shortness of breath.   Cardiovascular: Positive for chest pain. Negative for palpitations and leg swelling.  Gastrointestinal: Negative for abdominal distention, abdominal pain, blood in stool, constipation, diarrhea, nausea and vomiting.  Genitourinary: Negative for difficulty urinating, dysuria, flank pain, frequency, hematuria, pelvic pain, urgency, vaginal bleeding, vaginal discharge and vaginal pain.  Musculoskeletal: Negative for arthralgias, gait problem and joint swelling.  Skin: Negative for rash.  Neurological: Negative for dizziness, syncope, speech difficulty, weakness, numbness and headaches.  Hematological: Negative for adenopathy.  Psychiatric/Behavioral: Negative for agitation, behavioral problems and dysphoric mood. The patient is not nervous/anxious.        Objective:   Physical Exam  Constitutional: She is oriented to person, place, and time. She appears well-developed and well-nourished. No distress.  Appears slightly weak but in no acute distress Easily ambulatory Afebrile O2 saturation 98  HENT:  Head: Normocephalic.  Right Ear: External ear normal.  Left Ear: External ear normal.  Mouth/Throat: Oropharynx is clear and moist.  Eyes: Conjunctivae and EOM are normal. Pupils are equal, round, and reactive to light.  Neck: Normal range of motion. Neck supple. No thyromegaly present.    Cardiovascular: Normal rate, regular rhythm, normal heart sounds and intact distal pulses.  Pulmonary/Chest: Effort normal and breath sounds normal. No respiratory distress. She has no wheezes. She has no rales.  Chest is clear except for a few crackles at the left base Right chest is clear  Abdominal: Soft. Bowel sounds are normal. She exhibits no mass. There is no tenderness.  Musculoskeletal: Normal range of motion.  Lymphadenopathy:    She has no cervical adenopathy.  Neurological: She is alert and oriented to person, place, and time.  Skin: Skin is warm and dry. No rash noted.  Psychiatric: She has a normal mood and affect. Her behavior is normal.          Assessment & Plan:   Right-sided necrotizing pneumonia/abscess.  Continue Augmentin Pulmonary consult placed History of hiatal hernia with esophageal stricture.  No recent episodes of nausea or vomiting Essential hypertension  Pulmonary consultation Follow-up 2 weeks or as needed Patient will report any new or worsening symptoms  Rogelia Boga

## 2017-12-30 ENCOUNTER — Telehealth: Payer: Self-pay | Admitting: Internal Medicine

## 2017-12-30 LAB — COMPREHENSIVE METABOLIC PANEL
ALBUMIN: 3.4 g/dL — AB (ref 3.5–5.2)
ALT: 15 U/L (ref 0–35)
AST: 14 U/L (ref 0–37)
Alkaline Phosphatase: 119 U/L — ABNORMAL HIGH (ref 39–117)
BILIRUBIN TOTAL: 0.3 mg/dL (ref 0.2–1.2)
BUN: 33 mg/dL — ABNORMAL HIGH (ref 6–23)
CO2: 33 mEq/L — ABNORMAL HIGH (ref 19–32)
CREATININE: 1.03 mg/dL (ref 0.40–1.20)
Calcium: 9.3 mg/dL (ref 8.4–10.5)
Chloride: 98 mEq/L (ref 96–112)
GFR: 54.8 mL/min — ABNORMAL LOW (ref >60.00)
GLUCOSE: 95 mg/dL (ref 70–99)
POTASSIUM: 3.8 meq/L (ref 3.5–5.1)
SODIUM: 141 meq/L (ref 135–145)
Total Protein: 6.7 g/dL (ref 6.0–8.3)

## 2017-12-30 LAB — CBC WITH DIFFERENTIAL/PLATELET
BASOS ABS: 0.1 10*3/uL (ref 0.0–0.1)
Basophils Relative: 0.4 % (ref 0.0–3.0)
Eosinophils Absolute: 0.5 10*3/uL (ref 0.0–0.7)
Eosinophils Relative: 3.8 % (ref 0.0–5.0)
HCT: 32.2 % — ABNORMAL LOW (ref 36.0–46.0)
HEMOGLOBIN: 10.7 g/dL — AB (ref 12.0–15.0)
LYMPHS ABS: 1.8 10*3/uL (ref 0.7–4.0)
Lymphocytes Relative: 12.9 % (ref 12.0–46.0)
MCHC: 33.2 g/dL (ref 30.0–36.0)
MCV: 90.8 fl (ref 78.0–100.0)
MONO ABS: 0.8 10*3/uL (ref 0.1–1.0)
Monocytes Relative: 5.9 % (ref 3.0–12.0)
NEUTROS PCT: 77 % (ref 43.0–77.0)
Neutro Abs: 10.6 10*3/uL — ABNORMAL HIGH (ref 1.4–7.7)
Platelets: 534 10*3/uL — ABNORMAL HIGH (ref 150.0–400.0)
RBC: 3.54 Mil/uL — AB (ref 3.87–5.11)
RDW: 13.1 % (ref 11.5–15.5)
WBC: 13.8 10*3/uL — AB (ref 4.0–10.5)

## 2017-12-30 NOTE — Telephone Encounter (Signed)
Appt made. Nothing further is needed.

## 2017-12-30 NOTE — Telephone Encounter (Signed)
MW, please advise if we can possibly work patient into your schedule sooner.   These are the results of her most recent CT scan.    IMPRESSION: 1. No evidence of pulmonary embolism. 2. Necrotizing pneumonia/lung abscess versus necrotic mass involving the lateral right upper lobe. As there is patchy pneumonia inferior and superior to the dense masslike consolidation, necrotizing pneumonia/lung access is favored. However, follow-up imaging after treatment is recommended to confirm resolution. 3. Lymphadenopathy involving the right hilum and the mediastinum, likely reactive. 4. Very small nodules in the right upper lobe and superior segment right lower lobe, the largest in the superior segment right lower lobe measuring approximately 5 mm. Follow-up of these nodules will depend on their presence or absence after treatment. 5. Mild cardiomegaly.  Moderate coronary atherosclerosis. 6. Old granulomatous disease.  Please advise. Thanks!

## 2017-12-30 NOTE — Telephone Encounter (Signed)
Ok to add at 115 pm 2/27 or at end of any afternoon

## 2017-12-31 ENCOUNTER — Telehealth: Payer: Self-pay | Admitting: Internal Medicine

## 2017-12-31 ENCOUNTER — Encounter: Payer: Self-pay | Admitting: Internal Medicine

## 2017-12-31 ENCOUNTER — Ambulatory Visit: Payer: Medicare Other | Admitting: Internal Medicine

## 2017-12-31 VITALS — BP 124/74 | HR 79 | Ht 62.0 in | Wt 160.6 lb

## 2017-12-31 DIAGNOSIS — J851 Abscess of lung with pneumonia: Secondary | ICD-10-CM

## 2017-12-31 MED ORDER — PANTOPRAZOLE SODIUM 40 MG PO TBEC: DELAYED_RELEASE_TABLET | ORAL | 2 refills | Status: DC

## 2017-12-31 MED ORDER — AMOXICILLIN-POT CLAVULANATE 875-125 MG PO TABS: 1.0000 | ORAL_TABLET | Freq: Two times a day (BID) | ORAL | 0 refills | Status: AC

## 2017-12-31 MED ORDER — TRAMADOL HCL 50 MG PO TABS: ORAL_TABLET | ORAL | 0 refills | Status: DC

## 2017-12-31 NOTE — Progress Notes (Signed)
Subjective:     Patient ID: Julie Rice, female   DOB: Apr 09, 1936,   MRN: [id number]  HPI  31 yowf never smoker dx RA followed [place] on nsaids with [place] around 2000 eval by Dr Reece Agar inhaler helped some but never back to baseline then rx by [facility] 08/02/16 LAPAROSCOPIC REPAIR OF LARGE  HIATAL HERNIA LAPAROSCOPIC NISSEN FUNDOPLICATION >>  and dysphagia ever since with eval [name] 02/19/17 EGD dilated benign esoph stenosis and improved but  new cough /gag/vomit around Nov 04 2017 then abrupt pain under R breast x 2-3  days then cxr 12/23/17 c/w pna with cavitary changes on CT 12/25/17 so referred to pulmonary clinic 12/31/2017 by Dr  Kirtland Bouchard p rx with augmentin starting 12/25/17    12/31/2017 1st [facility] office visit/ Mattison Stuckey   Chief Complaint  Patient presents with  . Pulmonary Consult    Referred by Dr. Amador Cunas. Pt c/o cough since Jan 2019. She states she has had SOB off and on for the past several yrs. She was recently dxed with PNA. Her cough is non prod and worse at night.   since abx started R side improved, cough still a problem > beige esp at hs / still gag/ vomit with overt HB not on ppi daily ac Lower molar R dental work one month prior to onset of cp Sleeps on L side typically not on back  Not limited by breathing from desired activities    No obvious day to day or daytime variability or assoc frankly putrid or even all that  Purulent of described  sputum or mucus plugs or hemoptysis or ongoing cp or chest tightness, subjective wheeze or overt sinus  symptoms. No unusual exposure hx or h/o childhood pna/ asthma or knowledge of premature birth.  Sleeping ok flat without nocturnal  or early am exacerbation  of respiratory  c/o's or need for noct saba. Also denies any obvious fluctuation of symptoms with weather or environmental changes or other aggravating or alleviating factors except as outlined above   Current Allergies, Complete Past Medical History, Past Surgical History, Family  History, and Social History were reviewed in [employer] record.  ROS  The following are not active complaints unless bolded Hoarseness, sore throat, dysphagia, dental problems, itching, sneezing,  nasal congestion or discharge of excess mucus or purulent secretions, ear ache,   fever, chills, sweats, unintended wt loss or wt gain, classically pleuritic or exertional cp,  orthopnea pnd or leg swelling, presyncope, palpitations, abdominal pain, anorexia, nausea, vomiting, diarrhea  or change in bowel habits or change in bladder habits, change in stools or change in urine, dysuria, hematuria,  rash, arthralgias, visual complaints, headache, numbness, weakness or ataxia or problems with walking or coordination,  change in mood/affect or memory.        Current Meds  Medication Sig  . amLODipine (NORVASC) 2.5 MG tablet Take 1 tablet (2.5 mg total) by mouth daily.  Marland Kitchen aspirin 81 MG tablet Take 1 tablet (81 mg total) by mouth daily. For cardiovascular health.  . clonazePAM (KLONOPIN) 0.5 MG tablet TAKE 1 TABLET BY MOUTH THREE TIMES A DAY AS NEEDED  . furosemide (LASIX) 40 MG tablet TAKE 1 TABLET BY MOUTH EVERY DAY  . gabapentin (NEURONTIN) 100 MG capsule TAKE 1 CAPSULE BY MOUTH THREE TIMES A DAY  . losartan-hydrochlorothiazide (HYZAAR) 100-25 MG tablet Take 1 tablet by mouth daily.  . Multiple Vitamin (MULTIVITAMIN) tablet Take 1 tablet by mouth daily. Vitamin supplement.  Marland Kitchen  sertraline (ZOLOFT) 100 MG tablet TAKE 1 TABLET BY MOUTH EVERY DAY  . [  amoxicillin-clavulanate (AUGMENTIN) 875-125 MG tablet Take 1 tablet by mouth 2 (two) times daily.  . [  pantoprazole (PROTONIX) 20 MG tablet Take 1 tablet (20 mg total) by mouth daily.         Review of Systems     Objective:   Physical Exam    amb wf nad  Wt Readings from Last 3 Encounters:  12/31/17 160 lb 9.6 oz (72.8 kg)  12/29/17 162 lb (73.5 kg)  12/22/17 164 lb 4 oz (74.5 kg)     Vital signs reviewed - Note on  arrival 02 sats  99% on RA     HEENT: nl dentition, turbinates bilaterally, and oropharynx. Nl external ear canals without cough reflex   NECK :  without JVD/Nodes/TM/ nl carotid upstrokes bilaterally   LUNGS: no acc muscle use,  Nl contour chest which is clear to A and P bilaterally without cough on insp or exp maneuvers   CV:  RRR  no s3 or murmur or increase in P2, and no edema   ABD:  soft and nontender with nl inspiratory excursion in the supine position. No bruits or organomegaly appreciated, bowel sounds nl  MS:  Nl gait/ ext warm without deformities, calf tenderness, cyanosis or clubbing No obvious joint restrictions   SKIN: warm and dry without lesions    NEURO:  alert, approp, nl sensorium with  no motor or cerebellar deficits apparent.      I personally reviewed images and agree with radiology impression as follows:   Chest CT  12/25/17  1. No evidence of pulmonary embolism. 2. Necrotizing pneumonia/lung abscess versus necrotic mass involving the lateral right upper lobe. As there is patchy pneumonia inferior and superior to the dense masslike consolidation, necrotizing pneumonia/lung access is favored. However, follow-up imaging after treatment is recommended to confirm resolution. 3. Lymphadenopathy involving the right hilum and the mediastinum, likely reactive. 4. Very small nodules in the right upper lobe and superior segment right lower lobe, the largest in the superior segment right lower lobe measuring approximately 5 mm. Follow-up of these nodules will depend on their presence or absence after treatment. 5. Mild cardiomegaly.  Moderate coronary atherosclerosis. 6. Old granulomatous disease.   Labs  Reviewed 12/31/2017      Chemistry      Component Value Date/Time   NA 141 12/29/2017 1704   K 3.8 12/29/2017 1704   CL 98 12/29/2017 1704   CO2 33 (H) 12/29/2017 1704   BUN 33 (H) 12/29/2017 1704   CREATININE 1.03 12/29/2017 1704      Component  Value Date/Time   CALCIUM 9.3 12/29/2017 1704   ALKPHOS 119 (H) 12/29/2017 1704   AST 14 12/29/2017 1704   ALT 15 12/29/2017 1704   BILITOT 0.3 12/29/2017 1704        Lab Results  Component Value Date   WBC 13.8 (H) 12/29/2017   HGB 10.7 (L) 12/29/2017   HCT 32.2 (L) 12/29/2017   MCV 90.8 12/29/2017   PLT 534.0 (H) 12/29/2017      WBC diff with L shift  12/31/2017   Lab Results  Component Value Date   DDIMER 0.35 06/05/2012      Lab Results  Component Value Date   TSH 2.02 04/25/2017     Lab Results  Component Value Date   PROBNP 288.9 (H) 04/10/2010       Lab Results  Component Value Date   ESRSEDRATE 23 (H) 12/08/2015   ESRSEDRATE 25 (H) 04/11/2015   ESRSEDRATE 20 04/11/2014         Assessment:

## 2017-12-31 NOTE — Patient Instructions (Addendum)
Augmentin 875 mg take one pill twice daily  X 10 days - take at breakfast and supper with large glass of water.  It would help reduce the usual side effects (diarrhea and yeast infections) if you ate cultured yogurt at lunch.    Protonix 20 mg x 2 (40 mg called in)   Take 30- 60 min before your first and last meals of the day   GERD (REFLUX)  is an extremely common cause of respiratory symptoms just like yours , many times with no obvious heartburn at all.    It can be treated with medication, but also with lifestyle changes including elevation of the head of your bed (ideally with 6 inch  bed blocks),  Smoking cessation, avoidance of late meals, excessive alcohol, and avoid fatty foods, chocolate, peppermint, colas, red wine, and acidic juices such as orange juice.  NO MINT OR MENTHOL PRODUCTS SO NO COUGH DROPS   USE SUGARLESS CANDY INSTEAD (Jolley ranchers or Stover's or Life Savers) or even ice chips will also do - the key is to swallow to prevent all throat clearing. NO OIL BASED VITAMINS - use powdered substitutes.    Take delsym two tsp every 12 hours and supplement if needed with  tramadol 50 mg up to 1 every 4 hours to suppress the urge to cough. Swallowing water or using ice chips/non mint and menthol containing candies (such as lifesavers or sugarless jolly ranchers) are also effective.  You should rest your voice and avoid activities that you know make you cough.  Once you have eliminated the cough for 3 straight days try reducing the tramadol first,  then the delsym as tolerated.     Return in 10 days with cxr and all active medications in hand

## 2017-12-31 NOTE — Telephone Encounter (Signed)
Called pt's pharmacy trying to figure out if augmentin was ready for pt.  Spoke with Julie Rice who stated the augmentin prescribed by MW was placed on hold due to pt's PCP placing pt on augmentin that was picked up 12/25/17 by pt.  Called pt and stated this information to her.  Pt expressed understanding and was told that the Rx by Dr. Sherene Rice was at the pharmacy but on hold at this time.  Nothing further needed at this current time.

## 2018-01-01 ENCOUNTER — Encounter: Payer: Self-pay | Admitting: Internal Medicine

## 2018-01-01 ENCOUNTER — Ambulatory Visit: Payer: Self-pay | Admitting: Internal Medicine

## 2018-01-01 DIAGNOSIS — J852 Abscess of lung without pneumonia: Secondary | ICD-10-CM | POA: Insufficient documentation

## 2018-01-01 NOTE — Assessment & Plan Note (Addendum)
rx augmentin 12/25/17 x 20 days planned     in the setting of recent dental procedure,  chronic dysphagia and overt hb and cough to point of gag vomit prior to the onset of classic pleuritic cp that has already improved on abx in a never smoker with h/o RA are all strongly in favor of a benign cavitary mass but not classic for pyogenic lung abscess which is in the most treatable/ curable of the dx possibilities.   Since she is already improving rec Complete 20 days augmentin then recheck cxr/ cbc/ esr  If not better at f/u then tbbx RUL ant segment can be arranged  In meantime max rx for gerd and eliminate cyclical coughing with tramadol   Discussed in detail all the  indications, usual  risks and alternatives  relative to the benefits with patient who agrees to proceed with conservative f/u as outlined    Total time devoted to counseling  > 50 % of initial 60 min office visit:  review case with pt/ discussion of options/alternatives/ personally creating written customized instructions  in presence of pt  then going over those specific  Instructions directly with the pt including how to use all of the meds but in particular covering each new medication in detail and the difference between the maintenance= "automatic" meds and the prns using an action plan format for the latter (If this problem/symptom => do that organization reading Left to right).  Please see AVS from this visit for a full list of these instructions which I personally wrote for this pt and  are unique to this visit.

## 2018-01-05 ENCOUNTER — Encounter: Payer: Self-pay | Admitting: Internal Medicine

## 2018-01-05 NOTE — Telephone Encounter (Signed)
12/31/17 AVS: Augmentin 875 mg take one pill twice daily  X 10 days - take at breakfast and supper with large glass of water.  It would help reduce the usual side effects (diarrhea and yeast infections) if you ate cultured yogurt at lunch.    Protonix 20 mg x 2 (40 mg called in)   Take 30- 60 min before your first and last meals of the day   GERD (REFLUX)  is an extremely common cause of respiratory symptoms just like yours , many times with no obvious heartburn at all.    It can be treated with medication, but also with lifestyle changes including elevation of the head of your bed (ideally with 6 inch  bed blocks),  Smoking cessation, avoidance of late meals, excessive alcohol, and avoid fatty foods, chocolate, peppermint, colas, red wine, and acidic juices such as orange juice.  NO MINT OR MENTHOL PRODUCTS SO NO COUGH DROPS   USE SUGARLESS CANDY INSTEAD (Jolley ranchers or Stover's or Life Savers) or even ice chips will also do - the key is to swallow to prevent all throat clearing. NO OIL BASED VITAMINS - use powdered substitutes.    Take delsym two tsp every 12 hours and supplement if needed with  tramadol 50 mg up to 1 every 4 hours to suppress the urge to cough. Swallowing water or using ice chips/non mint and menthol containing candies (such as lifesavers or sugarless jolly ranchers) are also effective.  You should rest your voice and avoid activities that you know make you cough.  Once you have eliminated the cough for 3 straight days try reducing the tramadol first,  then the delsym as tolerated.     Return in 10 days with cxr and all active medications in hand    MW please advise. Thanks

## 2018-01-08 ENCOUNTER — Institutional Professional Consult (permissible substitution): Payer: Self-pay | Admitting: Internal Medicine

## 2018-01-08 NOTE — Telephone Encounter (Signed)
This message should go to Dr. Sherene Sires since he is the one treating her

## 2018-01-12 ENCOUNTER — Ambulatory Visit: Payer: Self-pay | Admitting: Family Medicine

## 2018-01-12 ENCOUNTER — Ambulatory Visit: Payer: Medicare Other | Admitting: Internal Medicine

## 2018-01-12 ENCOUNTER — Other Ambulatory Visit: Payer: Self-pay | Admitting: Internal Medicine

## 2018-01-12 ENCOUNTER — Encounter: Payer: Self-pay | Admitting: Internal Medicine

## 2018-01-12 ENCOUNTER — Ambulatory Visit (INDEPENDENT_AMBULATORY_CARE_PROVIDER_SITE_OTHER)
Admission: RE | Admit: 2018-01-12 | Discharge: 2018-01-12 | Disposition: A | Discharge status: Home / Self Care | Payer: Medicare Other | Source: Ambulatory Visit | Attending: Internal Medicine | Admitting: Internal Medicine

## 2018-01-12 VITALS — BP 124/66 | HR 76 | Ht 62.0 in | Wt 159.0 lb

## 2018-01-12 DIAGNOSIS — J851 Abscess of lung with pneumonia: Secondary | ICD-10-CM

## 2018-01-12 DIAGNOSIS — J189 Pneumonia, unspecified organism: Secondary | ICD-10-CM | POA: Diagnosis not present

## 2018-01-12 MED ORDER — TRAMADOL HCL 50 MG PO TABS: ORAL_TABLET | ORAL | 0 refills | Status: DC

## 2018-01-12 MED ORDER — CLINDAMYCIN HCL 300 MG PO CAPS: 300.0000 mg | ORAL_CAPSULE | Freq: Three times a day (TID) | ORAL | 0 refills | Status: DC

## 2018-01-12 NOTE — Progress Notes (Signed)
Subjective:     Patient ID: Julie Rice, female   DOB: 1935-12-16,   MRN: [id number]    Brief patient profile:  82 yowf never smoker dx RA followed [place] on nsaids with new doe around 2000 eval by Dr Jayme Cloud: inhaler helped some but never back to baseline then rx by [facility] 08/02/16 LAPAROSCOPIC REPAIR OF LARGE  HIATAL HERNIA LAPAROSCOPIC NISSEN FUNDOPLICATION >>  and dysphagia ever since with eval [name] 02/19/17 EGD dilated benign esoph stenosis and improved but  new cough /gag/vomit around Nov 04 2017 then abrupt pain under R breast x around 12/20/17 then cxr 12/22/17 c/w pna with cavitary changes on CT 12/25/17 so referred to pulmonary clinic 12/31/2017 by Dr  Kirtland Bouchard p rx with augmentin starting 12/25/17 for possible lung abscess.     History of Present Illness  12/31/2017 1st [facility] office visit/ Julie Rice   Chief Complaint  Patient presents with  . Pulmonary Consult    Referred by Dr. Amador Cunas. Pt c/o cough since Jan 2019. She states she has had SOB off and on for the past several yrs. She was recently dxed with PNA. Her cough is non prod and worse at night.   since abx started R side CP  improved, cough still a problem > beige esp at hs / still gag/ vomit with overt HB not on ppi daily ac Lower molar R dental work one month prior to onset of cp Sleeps on L side typically not on back  rec Augmentin 875 mg take one pill twice daily  X 10 days   Protonix 20 mg x 2 (40 mg called in)   Take 30- 60 min before your first and last meals of the day  GERD  diet Take delsym two tsp every 12 hours and supplement if needed with  tramadol 50 mg up to 1 every 4 hours     01/12/2018  f/u ov/Julie Rice re: lung abscess  "I'm no better at all"  Chief Complaint  Patient presents with  . Follow-up    SOB with activity and at rest, non-productive cough, chest pain bilateral sides behind breast,   Dyspnea:  No better Cough: no sputum production  Sleep: tramadol works well / only taking twice daily  (rec was for up to 1 q4)  Cp now 2/10 only feels it with deep breath   No obvious day to day or daytime variability or assoc excess/ purulent sputum or mucus plugs or hemoptysis   or chest tightness, subjective wheeze or overt sinus or hb symptoms. No unusual exposure hx or h/o childhood pna/ asthma or knowledge of premature birth.  Sleeping ok flat without nocturnal  or early am exacerbation  of respiratory  c/o's or need for noct saba. Also denies any obvious fluctuation of symptoms with weather or environmental changes or other aggravating or alleviating factors except as outlined above   Current Allergies, Complete Past Medical History, Past Surgical History, Family History, and Social History were reviewed in [employer] record.   ROS  The following are not active complaints unless bolded Hoarseness, sore throat, dysphagia, dental problems, itching, sneezing,  nasal congestion or discharge of excess mucus or purulent secretions, ear ache,   fever, chills, sweats, unintended wt loss or wt gain, classically    exertional cp,  orthopnea pnd or leg swelling, presyncope, palpitations, abdominal pain, anorexia, nausea, vomiting, diarrhea  or change in bowel habits or change in bladder habits, change in stools or change in urine, dysuria,  hematuria,  rash, arthralgias, visual complaints, headache, numbness, weakness or ataxia or problems with walking or coordination,  change in mood/affect or memory.        Current Meds  Medication Sig  . amLODipine (NORVASC) 2.5 MG tablet Take 1 tablet (2.5 mg total) by mouth daily.  Marland Kitchen aspirin 81 MG tablet Take 1 tablet (81 mg total) by mouth daily. For cardiovascular health.  . clonazePAM (KLONOPIN) 0.5 MG tablet TAKE 1 TABLET BY MOUTH THREE TIMES A DAY AS NEEDED  . furosemide (LASIX) 40 MG tablet TAKE 1 TABLET BY MOUTH EVERY DAY  . gabapentin (NEURONTIN) 100 MG capsule TAKE 1 CAPSULE BY MOUTH THREE TIMES A DAY  .  losartan-hydrochlorothiazide (HYZAAR) 100-25 MG tablet Take 1 tablet by mouth daily.  . Multiple Vitamin (MULTIVITAMIN) tablet Take 1 tablet by mouth daily. Vitamin supplement.  . pantoprazole (PROTONIX) 40 MG tablet Take 30- 60 min before your first and last meals of the day  . sertraline (ZOLOFT) 100 MG tablet TAKE 1 TABLET BY MOUTH EVERY DAY  . traMADol (ULTRAM) 50 MG tablet 1-2 every 4 hours as needed for cough or pain  .                Objective:   Physical Exam  Very somber wf (to point of hopleless affect)  spends most of interview focusing on what Dr Kirtland Bouchard said during past ov's  than answering questions about her preent symptoms and seemed angry when redirected to the questions asked   01/12/2018        159   12/31/17 160 lb 9.6 oz (72.8 kg)  12/29/17 162 lb (73.5 kg)  12/22/17 164 lb 4 oz (74.5 kg)      HEENT: nl dentition, turbinates bilaterally, and oropharynx. Nl external ear canals without cough reflex   NECK :  without JVD/Nodes/TM/ nl carotid upstrokes bilaterally   LUNGS: no acc muscle use,  Nl contour chest with very minimal rhonchi R ant chest s egophony or amphoric changes or localized wheeze   CV:  RRR  no s3 or murmur or increase in P2, and no edema   ABD:  soft and nontender with nl inspiratory excursion in the supine position. No bruits or organomegaly appreciated, bowel sounds nl  MS:  Nl gait/ ext warm without deformities, calf tenderness, cyanosis or clubbing No obvious joint restrictions   SKIN: warm and dry without lesions    NEURO:  alert, depressed appearing with  nl sensorium with  no motor or cerebellar deficits apparent.        CXR PA and Lateral:   01/12/2018 :    I personally reviewed images and agree with radiology impression as follows:    Improving density in the right upper lobe. Continued follow-up is recommended.      Labs  Reviewed 01/12/2018    Lab Results  Component Value Date   WBC 13.8 (H) 12/29/2017   HGB 10.7 (L)  12/29/2017   HCT 32.2 (L) 12/29/2017   MCV 90.8 12/29/2017   PLT 534.0 (H) 12/29/2017      WBC diff with L shift  12/31/2017        Lab Results  Component Value Date   ESRSEDRATE 23 (H) 12/08/2015   ESRSEDRATE 25 (H) 04/11/2015   ESRSEDRATE 20 04/11/2014          Assessment:

## 2018-01-12 NOTE — Patient Instructions (Addendum)
Take delsym two tsp every 12 hours and supplement if needed with  tramadol 50 mg up to 1 every 4 hours to suppress the urge to cough. Swallowing water or using ice chips/non mint and menthol containing candies (such as lifesavers or sugarless jolly ranchers) are also effective.  You should rest your voice and avoid activities that you know make you cough.  Once you have eliminated the cough for 3 straight days try reducing the tramadol first,  then the delsym as tolerated.  Please remember to go to the  x-ray department downstairs in the basement  for your tests - we will call you with the results when they are available.    Clindamycin 150 mg four times a day x 10 days and then return with cxr      Add:  Changed to 300 mg tid and advised by phone

## 2018-01-13 ENCOUNTER — Other Ambulatory Visit: Payer: Self-pay | Admitting: Family Medicine

## 2018-01-13 ENCOUNTER — Encounter: Payer: Self-pay | Admitting: Internal Medicine

## 2018-01-13 DIAGNOSIS — N644 Mastodynia: Secondary | ICD-10-CM

## 2018-01-13 NOTE — Assessment & Plan Note (Addendum)
rx augmentin 12/25/17 x 20 days planned  - improved radiographically  01/12/2018 but still "no better" so rec clind 300 tid x 10 days then return with cxr    Comment:  Improvement radiographically is obvious and totally at odds with her hx of "I'm no better" with dry cough,  2/10 cp with deep breath and doe (though no desats today walking) so reasonable to try clind x 10 days then regroup  Note about 15-20% of pts with lung abscess prove to have underlying ca,  But she is a never smoker with RA making it more likely this is necrobiotic nodule than lung ca.  The risk of attempting a bx is spillage throughout the dependent portion of her R lung if done by fob and empyema/ bp fistula if attempted percutaneously  Discussed in detail all the  indications, usual  risks and alternatives  relative to the benefits with patient who agrees to proceed with conservative f/u as outlined with clinda first.  Cautioned re eating yogurt or pro-biotics and reporting any diarrhea immediately  I had an extended discussion with the patient reviewing all relevant studies completed to date and  lasting 15 to 20 minutes of a 25 minute visit    Each maintenance medication was reviewed in detail including most importantly the difference between maintenance and prns and under what circumstances the prns are to be triggered using an action plan format that is not reflected in the computer generated alphabetically organized AVS.    Please see AVS for specific instructions unique to this visit that I personally wrote and verbalized to the the pt in detail and then reviewed with pt  by my nurse highlighting any  changes in therapy recommended at today's visit to their plan of care.

## 2018-01-22 ENCOUNTER — Other Ambulatory Visit: Payer: Self-pay | Admitting: Internal Medicine

## 2018-02-01 ENCOUNTER — Emergency Department (HOSPITAL_COMMUNITY): Payer: Medicare Other

## 2018-02-01 ENCOUNTER — Emergency Department (HOSPITAL_COMMUNITY)
Admission: EM | Admit: 2018-02-01 | Discharge: 2018-02-01 | Disposition: A | Discharge status: Home / Self Care | Payer: Medicare Other | Attending: Emergency Medicine | Admitting: Emergency Medicine

## 2018-02-01 ENCOUNTER — Encounter (HOSPITAL_COMMUNITY): Payer: Self-pay

## 2018-02-01 ENCOUNTER — Other Ambulatory Visit: Payer: Self-pay

## 2018-02-01 DIAGNOSIS — R069 Unspecified abnormalities of breathing: Secondary | ICD-10-CM | POA: Diagnosis not present

## 2018-02-01 DIAGNOSIS — J449 Chronic obstructive pulmonary disease, unspecified: Secondary | ICD-10-CM | POA: Diagnosis not present

## 2018-02-01 DIAGNOSIS — I2510 Atherosclerotic heart disease of native coronary artery without angina pectoris: Secondary | ICD-10-CM | POA: Insufficient documentation

## 2018-02-01 DIAGNOSIS — R0781 Pleurodynia: Secondary | ICD-10-CM | POA: Diagnosis not present

## 2018-02-01 DIAGNOSIS — I10 Essential (primary) hypertension: Secondary | ICD-10-CM | POA: Diagnosis not present

## 2018-02-01 DIAGNOSIS — R112 Nausea with vomiting, unspecified: Secondary | ICD-10-CM

## 2018-02-01 DIAGNOSIS — J189 Pneumonia, unspecified organism: Secondary | ICD-10-CM | POA: Diagnosis not present

## 2018-02-01 DIAGNOSIS — R630 Anorexia: Secondary | ICD-10-CM | POA: Diagnosis not present

## 2018-02-01 DIAGNOSIS — Z79899 Other long term (current) drug therapy: Secondary | ICD-10-CM | POA: Diagnosis not present

## 2018-02-01 DIAGNOSIS — Z7982 Long term (current) use of aspirin: Secondary | ICD-10-CM | POA: Insufficient documentation

## 2018-02-01 DIAGNOSIS — R05 Cough: Secondary | ICD-10-CM | POA: Diagnosis not present

## 2018-02-01 DIAGNOSIS — Z8673 Personal history of transient ischemic attack (TIA), and cerebral infarction without residual deficits: Secondary | ICD-10-CM | POA: Insufficient documentation

## 2018-02-01 DIAGNOSIS — R0602 Shortness of breath: Secondary | ICD-10-CM | POA: Diagnosis present

## 2018-02-01 LAB — COMPREHENSIVE METABOLIC PANEL
ALBUMIN: 3.2 g/dL — AB (ref 3.5–5.0)
ALT: 17 U/L (ref 14–54)
AST: 22 U/L (ref 15–41)
Alkaline Phosphatase: 111 U/L (ref 38–126)
Anion gap: 10 (ref 5–15)
BUN: 12 mg/dL (ref 6–20)
CHLORIDE: 103 mmol/L (ref 101–111)
CO2: 30 mmol/L (ref 22–32)
CREATININE: 0.75 mg/dL (ref 0.44–1.00)
Calcium: 9.1 mg/dL (ref 8.9–10.3)
GFR calc Af Amer: >60 mL/min (ref >60)
GLUCOSE: 111 mg/dL — AB (ref 65–99)
POTASSIUM: 3.3 mmol/L — AB (ref 3.5–5.1)
SODIUM: 143 mmol/L (ref 135–145)
Total Bilirubin: 0.4 mg/dL (ref 0.3–1.2)
Total Protein: 6.7 g/dL (ref 6.5–8.1)

## 2018-02-01 LAB — URINALYSIS, ROUTINE W REFLEX MICROSCOPIC
BILIRUBIN URINE: NEGATIVE
Glucose, UA: NEGATIVE mg/dL
HGB URINE DIPSTICK: NEGATIVE
Ketones, ur: NEGATIVE mg/dL
Leukocytes, UA: NEGATIVE
Nitrite: NEGATIVE
PH: 7 (ref 5.0–8.0)
Protein, ur: NEGATIVE mg/dL
SPECIFIC GRAVITY, URINE: 1.008 (ref 1.005–1.030)

## 2018-02-01 LAB — CBC WITH DIFFERENTIAL/PLATELET
BASOS ABS: 0.1 10*3/uL (ref 0.0–0.1)
BASOS PCT: 1 %
EOS PCT: 3 %
Eosinophils Absolute: 0.4 10*3/uL (ref 0.0–0.7)
HCT: 36.4 % (ref 36.0–46.0)
Hemoglobin: 11.9 g/dL — ABNORMAL LOW (ref 12.0–15.0)
LYMPHS PCT: 14 %
Lymphs Abs: 1.6 10*3/uL (ref 0.7–4.0)
MCH: 29.7 pg (ref 26.0–34.0)
MCHC: 32.7 g/dL (ref 30.0–36.0)
MCV: 90.8 fL (ref 78.0–100.0)
Monocytes Absolute: 0.6 10*3/uL (ref 0.1–1.0)
Monocytes Relative: 6 %
Neutro Abs: 8.7 10*3/uL — ABNORMAL HIGH (ref 1.7–7.7)
Neutrophils Relative %: 76 %
PLATELETS: 376 10*3/uL (ref 150–400)
RBC: 4.01 MIL/uL (ref 3.87–5.11)
RDW: 13.5 % (ref 11.5–15.5)
WBC: 11.4 10*3/uL — AB (ref 4.0–10.5)

## 2018-02-01 MED ORDER — ONDANSETRON 4 MG PO TBDP: 4.0000 mg | ORAL_TABLET | Freq: Three times a day (TID) | ORAL | 0 refills | Status: DC | PRN

## 2018-02-01 MED ORDER — ONDANSETRON HCL 4 MG/2ML IJ SOLN
4.0000 mg | Freq: Once | INTRAMUSCULAR | Status: AC
Start: 2018-02-01 — End: 2018-02-01
  Administered 2018-02-01: 4 mg via INTRAVENOUS
  Filled 2018-02-01: qty 2

## 2018-02-01 MED ORDER — SODIUM CHLORIDE 0.9 % IV BOLUS
500.0000 mL | Freq: Once | INTRAVENOUS | Status: AC
  Administered 2018-02-01: 500 mL via INTRAVENOUS

## 2018-02-01 NOTE — ED Provider Notes (Signed)
[facility]-EMERGENCY DEPT Provider Note   CSN: [id number] Arrival date & time: 02/01/18  1437     History   Chief Complaint Chief Complaint  Patient presents with  . Shortness of Breath  . Nausea  . Emesis    HPI Julie Rice is a 82 y.o. female.  HPI  Patient with history of esophageal stricture, hiatal hernia, GERD presents with nausea as well as ongoing cough for the past month.  She was diagnosed with pneumonia 1 month ago and treated with 2 courses of amoxicillin.  The pneumonia remained present on x-ray and she followed up with pulmonary who gave her a course of clindamycin.  She finished that 1 week ago.  She states the cough is improved but she does still have cough.  2 days ago she coughed and felt a sharp pain in her left rib and has had significant pain in that area since.  She also complains of nausea and difficulty swallowing liquids and solid food.  She has had a decreased appetite.  No ongoing fevers.  No diarrhea or abdominal pain.  No chest pain other than pain in ribs with coughing.  There are no other associated systemic symptoms, there are no other alleviating or modifying factors.   Past Medical History:  Diagnosis Date  . Anemia   . Anxiety   . Arthritis    ra and oa  . Bipolar disorder (HCC)   . Blood transfusion 1991   autologous pts own blood given   . CAD (coronary artery disease)   . Chest pain    "@ rest, lying down, w/exertion"  . COPD (chronic obstructive pulmonary disease) (HCC)   . Depression   . Dyspnea   . Esophageal stricture   . Fecal occult blood test positive   . Fibromyalgia   . GERD (gastroesophageal reflux disease)   . Hiatal hernia   . Hyperlipidemia   . Hypertension   . Internal hemorrhoids   . Lumbago   . Mitral regurgitation   . PONV (postoperative nausea and vomiting)    severe ponv, did ok with last surgery  . Rectal bleeding   . SOB (shortness of breath)    with exertion  . Spondylosis   .  TIA (transient ischemic attack) 2013  . Urinary incontinence    wears depends    Patient Active Problem List   Diagnosis Date Noted  . Lung abscess (HCC) 01/01/2018  . Periesophageal hiatal hernia 08/02/2016  . Dysphagia 05/10/2016  . Esophageal stenosis 05/10/2016  . Abdominal pain, epigastric 05/10/2016  . Generalized anxiety disorder 02/06/2012  . Major depressive disorder, recurrent (HCC) 02/05/2012  . Hypokalemia 11/09/2011  . Nausea and vomiting in adult 11/08/2011  . Dehydration 11/08/2011  . Tremors of nervous system 11/08/2011  . Hypothyroidism 05/10/2008  . Dyslipidemia 05/10/2008  . UNSPECIFIED ANEMIA 05/10/2008  . CAROTID ARTERY DISEASE 05/10/2008  . Transient cerebral ischemia 05/10/2008  . Chronic back pain 05/10/2008  . OSTEOPENIA 05/10/2008  . ESOPHAGEAL STRICTURE 01/19/2008  . Essential hypertension 04/29/2007  . GERD 04/29/2007  . Osteoarthritis 04/29/2007  . SPONDYLOSIS 04/29/2007    Past Surgical History:  Procedure Laterality Date  . ABDOMINAL HYSTERECTOMY  1982  . BLADDER SUSPENSION  1980's  . CATARACT EXTRACTION W/ INTRAOCULAR LENS  IMPLANT, BILATERAL Bilateral 2010  . DILATION AND CURETTAGE OF UTERUS  1961  . HIATAL HERNIA REPAIR N/A 08/02/2016   Procedure: LAPAROSCOPIC REPAIR OF LARGE  HIATAL HERNIA;  Surgeon: Glenna Fellows, MD;  Location: [facility];  Service: General;  Laterality: N/A;  . LAPAROSCOPIC NISSEN FUNDOPLICATION N/A 08/02/2016   Procedure: LAPAROSCOPIC NISSEN FUNDOPLICATION;  Surgeon: Glenna Fellows, MD;  Location: [facility];  Service: General;  Laterality: N/A;  . LUMBAR LAMINECTOMY  1990; 1994; [id number]   "I've got 2 stainless steel rods; 6 screws; 2 ray cages"took bone from right hip to put in back  . NISSEN FUNDOPLICATION  10/2016  . TONSILLECTOMY AND ADENOIDECTOMY  1945  . TOTAL KNEE ARTHROPLASTY Right ~ 1996  . TUBAL LIGATION  ~ 1976     OB History   None      Home Medications    Prior to Admission medications     Medication Sig Start Date End Date Taking? Authorizing Provider  amLODipine (NORVASC) 2.5 MG tablet Take 1 tablet (2.5 mg total) by mouth daily. 11/27/17  Yes Gordy Savers, MD  aspirin 81 MG tablet Take 1 tablet (81 mg total) by mouth daily. For cardiovascular health. 02/07/12  Yes Viviann Spare, FNP  clonazePAM (KLONOPIN) 0.5 MG tablet TAKE 1 TABLET BY MOUTH THREE TIMES A DAY AS NEEDED 11/19/17  Yes Gordy Savers, MD  furosemide (LASIX) 40 MG tablet TAKE 1 TABLET BY MOUTH EVERY DAY Patient taking differently: TAKE 1 TABLET BY MOUTH EVERY DAY as needed for swelling 10/24/17  Yes Gordy Savers, MD  gabapentin (NEURONTIN) 100 MG capsule TAKE 1 CAPSULE BY MOUTH THREE TIMES A DAY 07/08/17  Yes Gordy Savers, MD  losartan-hydrochlorothiazide [facility]) 100-25 MG tablet TAKE 1 TABLET BY MOUTH EVERY DAY 01/22/18  Yes Gordy Savers, MD  Multiple Vitamin (MULTIVITAMIN) tablet Take 1 tablet by mouth daily. Vitamin supplement. 02/07/12  Yes Viviann Spare, FNP  pantoprazole (PROTONIX) 40 MG tablet Take 30- 60 min before your first and last meals of the day 12/31/17  Yes Nyoka Cowden, MD  sertraline (ZOLOFT) 100 MG tablet TAKE 1 TABLET BY MOUTH EVERY DAY 01/13/18  Yes Gordy Savers, MD  traMADol (ULTRAM) 50 MG tablet 1-2 every 4 hours as needed for cough or pain Patient taking differently: Take 50-100 mg by mouth every 4 (four) hours as needed for moderate pain.  01/12/18  Yes Nyoka Cowden, MD  clindamycin (CLEOCIN) 300 MG capsule Take 1 capsule (300 mg total) by mouth 3 (three) times daily. Patient not taking: Reported on 02/01/2018 01/12/18   Nyoka Cowden, MD  ondansetron (ZOFRAN ODT) 4 MG disintegrating tablet Take 1 tablet (4 mg total) by mouth every 8 (eight) hours as needed for nausea or vomiting. 02/01/18   Rykin Route, Latanya Maudlin, MD    Family History Family History  Problem Relation Age of Onset  . Kidney disease Mother   . Hypertension Mother   . Heart disease  Father 35       MI  . Suicidality Son   . Colon cancer Neg Hx   . Esophageal cancer Neg Hx   . Pancreatic cancer Neg Hx     Social History Social History   Tobacco Use  . Smoking status: Never Smoker  . Smokeless tobacco: Never Used  Substance Use Topics  . Alcohol use: No  . Drug use: No     Allergies   Metaxalone; Morphine; and Tape   Review of Systems Review of Systems  ROS reviewed and all otherwise negative except for mentioned in HPI   Physical Exam Updated Vital Signs BP (!) 157/68 (BP Location: Left Arm)   Pulse 85   Temp  98.6 F (37 C) (Oral)   Resp 19   Ht 5\' 2"  (1.575 m)   Wt 72.1 kg (159 lb)   SpO2 95%   BMI 29.08 kg/m  Vitals reviewed Physical Exam  Physical Examination: General appearance - alert, well appearing, and in no distress Mental status - alert, oriented to person, place, and time Eyes - no conjunctival injection, no scleral icterus Mouth - mucous membranes moist, pharynx normal without lesions Neck - supple, no significant adenopathy Chest - clear to auscultation, no wheezes, rales or rhonchi, symmetric air entry Heart - normal rate, regular rhythm, normal S1, S2, no murmurs, rubs, clicks or gallops Abdomen - soft, nontender, nondistended, no masses or organomegaly, nabs Neurological - alert, oriented, normal speech Extremities - peripheral pulses normal, no pedal edema, no clubbing or cyanosis Skin - normal coloration and turgor, no rashes   ED Treatments / Results  Labs (all labs ordered are listed, but only abnormal results are displayed) Labs Reviewed  CBC WITH DIFFERENTIAL/PLATELET - Abnormal; Notable for the following components:      Result Value   WBC 11.4 (*)    Hemoglobin 11.9 (*)    Neutro Abs 8.7 (*)    All other components within normal limits  COMPREHENSIVE METABOLIC PANEL - Abnormal; Notable for the following components:   Potassium 3.3 (*)    Glucose, Bld 111 (*)    Albumin 3.2 (*)    All other components  within normal limits  URINALYSIS, ROUTINE W REFLEX MICROSCOPIC - Abnormal; Notable for the following components:   Color, Urine STRAW (*)    All other components within normal limits  URINE CULTURE    EKG EKG Interpretation  Date/Time:  Sunday February 01 2018 14:52:39 EDT Ventricular Rate:  97 PR Interval:    QRS Duration: 143 QT Interval:  408 QTC Calculation: 519 R Axis:   -44 Text Interpretation:  Sinus rhythm Ventricular premature complex Left bundle branch block Since previous tracing Left bundle branch block is new Confirmed by 04-10-1991 [phone]) on 02/01/2018 4:33:15 PM   Radiology Dg Ribs Unilateral W/chest Left  Result Date: 02/01/2018 CLINICAL DATA:  Cough.  Pneumonia.  Left rib pain. EXAM: LEFT RIBS AND CHEST - 3+ VIEW COMPARISON:  01/12/2018 chest radiograph. FINDINGS: Partially visualized bilateral posterior spinal fusion hardware in lumbar spine. Stable cardiomediastinal silhouette with top-normal heart size. No pneumothorax. No pleural effusion. Minimal focal hazy opacity in the peripheral upper right lung, decreased, compatible with resolving pneumonia. No pulmonary edema. No acute consolidative airspace disease. No fracture or suspicious focal osseous lesion is seen in the left ribs. IMPRESSION: 1. Continued decrease in hazy peripheral right upper lung opacity, compatible with resolving pneumonia. Follow-up PA and lateral chest radiographs advised in 2 months to document complete resolution. 2. No acute cardiopulmonary disease. 3. No evidence of left rib fracture. Electronically Signed   By: 03/14/2018 M.D.   On: 02/01/2018 17:27    Procedures Procedures (including critical care time)  Medications Ordered in ED Medications  sodium chloride 0.9 % bolus 500 mL (0 mLs Intravenous Stopped 02/01/18 1836)  ondansetron (ZOFRAN) injection 4 mg (4 mg Intravenous Given 02/01/18 1723)     Initial Impression / Assessment and Plan / ED Course  I have reviewed the triage  vital signs and the nursing notes.  Pertinent labs & imaging results that were available during my care of the patient were reviewed by me and considered in my medical decision making (see chart for details).  10:30 PM pt was able to drink fluids and eat crackers, she feels nausea is improved.  Urine shows no signs of infection or dehydration.  Pneumonia is improved on CXR.    Patient presenting with nausea and vomiting as well as ongoing cough and shortness of breath after treatment for pneumonia over the past 5 weeks.  She also complains of rib pain due to coughing.  Chest x-ray and x-ray of her ribs are reassuring and the area of pneumonia appears to be improving as expected after treatment.  Her labs are reassuring including urinalysis which does not show signs of dehydration or infection.  Her abdominal exam is benign.  She is able to tolerate p.o. after Zofran.  Discharged with strict return precautions.  Pt agreeable with plan.  Final Clinical Impressions(s) / ED Diagnoses   Final diagnoses:  Non-intractable vomiting with nausea, unspecified vomiting type    ED Discharge Orders        Ordered    ondansetron (ZOFRAN ODT) 4 MG disintegrating tablet  Every 8 hours PRN     02/01/18 2232       Phillis Haggis, MD 02/01/18 2328

## 2018-02-01 NOTE — ED Notes (Signed)
Bed: CW23 Expected date:  Expected time:  Means of arrival:  Comments: 82 yo SOB, N/V

## 2018-02-01 NOTE — ED Triage Notes (Addendum)
EMS reports from home SOB, nausea and vomiting  x 1 week. Pt dx with pneumonia 2/25, 2 rounds of amoxicillan and cleocyn  BP 166/84 HR 104 Sp02 94 RA Resp 18 CBG 135  20 LAC  4mg  Zofran enroute with relief

## 2018-02-01 NOTE — Discharge Instructions (Signed)
Return to the ED with any concerns including vomiting and not able to keep down liquids or your medications, abdominal pain especially if it localizes to the right lower abdomen, fever or chills, and decreased urine output, decreased level of alertness or lethargy, or any other alarming symptoms.  °

## 2018-02-03 LAB — URINE CULTURE: Culture: NO GROWTH

## 2018-02-05 ENCOUNTER — Ambulatory Visit: Payer: Medicare Other | Admitting: Internal Medicine

## 2018-02-05 ENCOUNTER — Encounter: Payer: Self-pay | Admitting: Internal Medicine

## 2018-02-05 ENCOUNTER — Ambulatory Visit (INDEPENDENT_AMBULATORY_CARE_PROVIDER_SITE_OTHER)
Admission: RE | Admit: 2018-02-05 | Discharge: 2018-02-05 | Disposition: A | Discharge status: Home / Self Care | Payer: Medicare Other | Source: Ambulatory Visit | Attending: Internal Medicine | Admitting: Internal Medicine

## 2018-02-05 VITALS — BP 152/88 | HR 79 | Ht 62.0 in | Wt 152.0 lb

## 2018-02-05 DIAGNOSIS — I10 Essential (primary) hypertension: Secondary | ICD-10-CM

## 2018-02-05 DIAGNOSIS — J851 Abscess of lung with pneumonia: Secondary | ICD-10-CM | POA: Diagnosis not present

## 2018-02-05 DIAGNOSIS — J181 Lobar pneumonia, unspecified organism: Secondary | ICD-10-CM | POA: Diagnosis not present

## 2018-02-05 MED ORDER — ONDANSETRON 4 MG PO TBDP: 4.0000 mg | ORAL_TABLET | Freq: Three times a day (TID) | ORAL | 0 refills | Status: DC | PRN

## 2018-02-05 NOTE — Patient Instructions (Addendum)
Please see Dr [name] as soon as possible as I suspect your swallowing problem caused your lung problem which is slowly healing  Stop your clindamycin    You will need a follow up cxr in 3 months - fine to let Dr Kirtland Bouchard do this  - return here if cough or pain with breathing gets worse with all active medications in hand including the over the counter ones

## 2018-02-05 NOTE — Progress Notes (Signed)
Subjective:     Patient ID: Julie Rice, female   DOB: 08/13/1936,   MRN: [id number]    Brief patient profile:  82 yowf never smoker dx RA followed Julie Rice on nsaids with new doe around 2000 eval by Dr Julie Rice: inhaler helped some but never back to baseline then rx by [facility] 08/02/16 LAPAROSCOPIC REPAIR OF LARGE  HIATAL HERNIA LAPAROSCOPIC NISSEN FUNDOPLICATION >>  and dysphagia ever since with eval Julie Rice 02/19/17 EGD dilated benign esoph stenosis and improved but  new cough /gag/vomit around Nov 04 2017 then abrupt pain under R breast x around 12/20/17 then cxr 12/22/17 c/w pna with cavitary changes on CT 12/25/17 so referred to pulmonary clinic 12/31/2017 by Dr  Julie Rice p rx with augmentin starting 12/25/17 for possible lung abscess.     History of Present Illness  12/31/2017 1st Julie Rice Pulmonary office visit/ Julie Rice   Chief Complaint  Patient presents with  . Pulmonary Consult    Referred by Dr. Amador Cunas. Pt c/o cough since Jan 2019. She states she has had SOB off and on for the past several yrs. She was recently dxed with PNA. Her cough is non prod and worse at night.   since abx started R side CP  improved, cough still a problem > beige esp at hs / still gag/ vomit with overt HB not on ppi daily ac Lower molar R dental work one month prior to onset of cp Sleeps on L side typically not on back  rec Augmentin 875 mg take one pill twice daily  X 10 days   Protonix 20 mg x 2 (40 mg called in)   Take 30- 60 min before your first and last meals of the day  GERD  diet Take delsym two tsp every 12 hours and supplement if needed with  tramadol 50 mg up to 1 every 4 hours     01/12/2018  f/u ov/Julie Rice re: lung abscess  "I'm no better at all"  Chief Complaint  Patient presents with  . Follow-up    SOB with activity and at rest, non-productive cough, chest pain bilateral sides behind breast,   Dyspnea:  No better Cough: no sputum production  Sleep: tramadol works well / only taking twice daily  (rec was for up to 1 q4)  Cp now 2/10 only feels it with deep breath  rec Take delsym two tsp every 12 hours and supplement if needed with  tramadol 50 mg up to 1 every 4 hours to suppress the urge to cough. Swallowing water or using ice chips/non mint and menthol containing candies (such as lifesavers or sugarless jolly ranchers) are also effective.  You should rest your voice and avoid activities that you know make you cough. Once you have eliminated the cough for 3 straight days try reducing the tramadol first,  then the delsym as tolerated. Please remember to go to the  x-ray department downstairs in the basement  for your tests - we will call you with the results when they are available. Clindamycin 150 mg four times a day x 10 days and then return with cxr      Add:  Changed to 300 mg tid and advised by phone     02/05/2018  f/u ov/Julie Rice re: final f/u re lung abscess from ? Asp pna  Chief Complaint  Patient presents with  . Follow-up    ER VISIT 3.31.19, NON PRODUCTIVE COUGH  R cp leveled off  Now can barely feel it with the  deepest breath Sob the same  At rest and worse walking  Cough is improved but still waking up with it and also some daytime but dry  N and V ? From clindamycin  Coughed so hard on 01/30/18 started to hurt L chest > to ER 02/01/18  New med from ER = zofran > eating and drinking now  Last clindamycin about a week prior to OV    No obvious day to day or daytime variability or assoc excess/ purulent sputum or mucus plugs or hemoptysis   or chest tightness, subjective wheeze or overt sinus or hb symptoms. No unusual exposure hx or h/o childhood pna/ asthma or knowledge of premature birth.  Sleeping ok flat without nocturnal  or early am exacerbation  of respiratory  c/o's or need for noct saba. Also denies any obvious fluctuation of symptoms with weather or environmental changes or other aggravating or alleviating factors except as outlined above   Current Allergies,  Complete Past Medical History, Past Surgical History, Family History, and Social History were reviewed in [employer] record.  ROS  The following are not active complaints unless bolded Hoarseness, sore throat, dysphagia, dental problems, itching, sneezing,  nasal congestion or discharge of excess mucus or purulent secretions, ear ache,   fever, chills, sweats, unintended wt loss or wt gain, classically pleuritic or exertional cp,  orthopnea pnd or leg swelling, presyncope, palpitations, abdominal pain, anorexia, nausea, vomiting, diarrhea  or change in bowel habits or change in bladder habits, change in stools or change in urine, dysuria, hematuria,  rash, arthralgias, visual complaints, headache, numbness, weakness or ataxia or problems with walking or coordination,  change in mood/affect or memory.        Current Meds  Medication Sig  . amLODipine (NORVASC) 2.5 MG tablet Take 1 tablet (2.5 mg total) by mouth daily.  Marland Kitchen aspirin 81 MG tablet Take 1 tablet (81 mg total) by mouth daily. For cardiovascular health.  . clonazePAM (KLONOPIN) 0.5 MG tablet TAKE 1 TABLET BY MOUTH THREE TIMES A DAY AS NEEDED  . furosemide (LASIX) 40 MG tablet TAKE 1 TABLET BY MOUTH EVERY DAY (Patient taking differently: TAKE 1 TABLET BY MOUTH EVERY DAY as needed for swelling)  . gabapentin (NEURONTIN) 100 MG capsule TAKE 1 CAPSULE BY MOUTH THREE TIMES A DAY  . losartan-hydrochlorothiazide (HYZAAR) 100-25 MG tablet TAKE 1 TABLET BY MOUTH EVERY DAY  . Multiple Vitamin (MULTIVITAMIN) tablet Take 1 tablet by mouth daily. Vitamin supplement.  . ondansetron (ZOFRAN ODT) 4 MG disintegrating tablet Take 1 tablet (4 mg total) by mouth every 8 (eight) hours as needed for nausea or vomiting.  . pantoprazole (PROTONIX) 40 MG tablet Take 30- 60 min before your first and last meals of the day  . sertraline (ZOLOFT) 100 MG tablet TAKE 1 TABLET BY MOUTH EVERY DAY  . traMADol (ULTRAM) 50 MG tablet 1-2 every 4 hours as  needed for cough or pain (Patient taking differently: Take 50-100 mg by mouth every 4 (four) hours as needed for moderate pain. )                         Objective:   Physical Exam    02/05/2018         152  01/12/2018        159   12/31/17 160 lb 9.6 oz (72.8 kg)  12/29/17 162 lb (73.5 kg)  12/22/17 164 lb 4 oz (74.5 kg)  Somber amb wf nad  Vital signs reviewed - Note on arrival 02 sats  95% on RA and BP  152/88   HEENT: nl dentition, turbinates bilaterally, and oropharynx. Nl external ear canals without cough reflex   NECK :  without JVD/Nodes/TM/ nl carotid upstrokes bilaterally   LUNGS: no acc muscle use,  Nl contour chest which is clear to A and P bilaterally without cough on insp or exp maneuvers   CV:  RRR  no s3 or murmur or increase in P2, and no edema   ABD:  soft and nontender with nl inspiratory excursion in the supine position. No bruits or organomegaly appreciated, bowel sounds nl  MS:  Nl gait/ ext warm without deformities, calf tenderness, cyanosis or clubbing No obvious joint restrictions   SKIN: warm and dry without lesions    NEURO:  alert, approp, nl sensorium with  no motor or cerebellar deficits apparent.             CXR PA and Lateral:   02/05/2018 :    I personally reviewed images and agree with radiology impression as follows:      1. Only minimal scarring remains at the site of prior pneumonia in the right upper lobe. No active process is seen. 2. Stable cardiomegaly.    Assessment:

## 2018-02-06 ENCOUNTER — Encounter: Payer: Self-pay | Admitting: Internal Medicine

## 2018-02-06 NOTE — Assessment & Plan Note (Signed)
Not optimally controlled on present regimen. I reviewed this with the patient and emphasized importance of follow-up with primary care.     

## 2018-02-06 NOTE — Assessment & Plan Note (Signed)
rx augmentin 12/25/17 x 20 days planned  - improved radiographically  01/12/2018 but still "no better" so rec clind 300 tid x 10 days then return with cxr  - 02/05/2018 reported not able to complete the clindamycin due to ugi issues but cxr almost completely clear   I had an extended final summary discussion with the patient reviewing all relevant studies completed to date and  lasting 15 to 20 minutes of a 25 minute visit on the following issues:   1) reviewed each aspect of her clinical course tracking specific symptoms which has all resolved x for UGI issues for which she has f/u with Dr [name] strongly rec  2) she does have a very minimal streaky density in RUL that probably does not need f/u but she is very focused on her cxr and reasonable to repeat one more to establish a  baseline in 3 months   3) Each maintenance medication was reviewed in detail including most importantly the difference between maintenance and as needed and under what circumstances the prns are to be used.  Please see AVS for specific  Instructions which are unique to this visit and I personally typed out  which were reviewed in detail in writing with the patient and a copy provided.    4) pulmonary f/u is prn

## 2018-02-07 ENCOUNTER — Other Ambulatory Visit: Payer: Self-pay | Admitting: Internal Medicine

## 2018-02-17 ENCOUNTER — Encounter: Payer: Self-pay | Admitting: Internal Medicine

## 2018-02-17 ENCOUNTER — Ambulatory Visit: Payer: Medicare Other | Admitting: Internal Medicine

## 2018-02-17 VITALS — BP 158/80 | HR 84 | Temp 97.8°F | Wt 150.0 lb

## 2018-02-17 DIAGNOSIS — I10 Essential (primary) hypertension: Secondary | ICD-10-CM | POA: Diagnosis not present

## 2018-02-17 DIAGNOSIS — J851 Abscess of lung with pneumonia: Secondary | ICD-10-CM

## 2018-02-17 DIAGNOSIS — K222 Esophageal obstruction: Secondary | ICD-10-CM

## 2018-02-17 MED ORDER — ONDANSETRON 4 MG PO TBDP: 4.0000 mg | ORAL_TABLET | Freq: Three times a day (TID) | ORAL | 0 refills | Status: DC | PRN

## 2018-02-17 NOTE — Progress Notes (Signed)
Subjective:    Patient ID: Julie Rice, female    DOB: 09-20-1936, 82 y.o.   MRN: [id number]  HPI 82 year old patient who has essential hypertension.  She has been followed by pulmonary recently due to a lung abscess.  She still has some minor cough and dyspnea but seems to be improving nicely. Otherwise doing well without concerns or complaints she has had some swallowing issues and is scheduled for GI follow-up next month.  She has required dilatations in the past for esophageal stricture.  She has had prior surgery for hiatal hernia  She has lost 14 pounds due to her recent illness but this has stabilized and her appetite has normalized  Past Medical History:  Diagnosis Date  . Anemia   . Anxiety   . Arthritis    ra and oa  . Bipolar disorder (HCC)   . Blood transfusion 1991   autologous pts own blood given   . CAD (coronary artery disease)   . Chest pain    "@ rest, lying down, w/exertion"  . COPD (chronic obstructive pulmonary disease) (HCC)   . Depression   . Dyspnea   . Esophageal stricture   . Fecal occult blood test positive   . Fibromyalgia   . GERD (gastroesophageal reflux disease)   . Hiatal hernia   . Hyperlipidemia   . Hypertension   . Internal hemorrhoids   . Lumbago   . Mitral regurgitation   . PONV (postoperative nausea and vomiting)    severe ponv, did ok with last surgery  . Rectal bleeding   . SOB (shortness of breath)    with exertion  . Spondylosis   . TIA (transient ischemic attack) 2013  . Urinary incontinence    wears depends     Social History   Socioeconomic History  . Marital status: Widowed    Spouse name: Not on file  . Number of children: 2 D  . Years of education: Not on file  . Highest education level: Not on file  Occupational History    Employer: RETIRED  Social Needs  . Financial resource strain: Not on file  . Food insecurity:    Worry: Not on file    Inability: Not on file  . Transportation needs:    Medical:  Not on file    Non-medical: Not on file  Tobacco Use  . Smoking status: Never Smoker  . Smokeless tobacco: Never Used  Substance and Sexual Activity  . Alcohol use: No  . Drug use: No  . Sexual activity: Not Currently  Lifestyle  . Physical activity:    Days per week: Not on file    Minutes per session: Not on file  . Stress: Not on file  Relationships  . Social connections:    Talks on phone: Not on file    Gets together: Not on file    Attends religious service: Not on file    Active member of club or organization: Not on file    Attends meetings of clubs or organizations: Not on file    Relationship status: Not on file  . Intimate partner violence:    Fear of current or ex partner: Not on file    Emotionally abused: Not on file    Physically abused: Not on file    Forced sexual activity: Not on file  Other Topics Concern  . Not on file  Social History Narrative  . Not on file    Past Surgical History:  Procedure Laterality Date  . ABDOMINAL HYSTERECTOMY  1982  . BLADDER SUSPENSION  1980's  . CATARACT EXTRACTION W/ INTRAOCULAR LENS  IMPLANT, BILATERAL Bilateral 2010  . DILATION AND CURETTAGE OF UTERUS  1961  . HIATAL HERNIA REPAIR N/A 08/02/2016   Procedure: LAPAROSCOPIC REPAIR OF LARGE  HIATAL HERNIA;  Surgeon: Glenna Fellows, MD;  Location: [facility];  Service: General;  Laterality: N/A;  . LAPAROSCOPIC NISSEN FUNDOPLICATION N/A 08/02/2016   Procedure: LAPAROSCOPIC NISSEN FUNDOPLICATION;  Surgeon: Glenna Fellows, MD;  Location: [facility];  Service: General;  Laterality: N/A;  . LUMBAR LAMINECTOMY  1990; 1994; [id number]   "I've got 2 stainless steel rods; 6 screws; 2 ray cages"took bone from right hip to put in back  . NISSEN FUNDOPLICATION  10/2016  . TONSILLECTOMY AND ADENOIDECTOMY  1945  . TOTAL KNEE ARTHROPLASTY Right ~ 1996  . TUBAL LIGATION  ~ 1976    Family History  Problem Relation Age of Onset  . Kidney disease Mother   . Hypertension Mother   . Heart  disease Father 45       MI  . Suicidality Son   . Colon cancer Neg Hx   . Esophageal cancer Neg Hx   . Pancreatic cancer Neg Hx     Allergies  Allergen Reactions  . Metaxalone   . Morphine     Flushing, rash, itching  . Tape Other (See Comments)    Band aides, adhesive tape Redness and pulls skin off    Current Outpatient Medications on File Prior to Visit  Medication Sig Dispense Refill  . amLODipine (NORVASC) 2.5 MG tablet Take 1 tablet (2.5 mg total) by mouth daily. 90 tablet 3  . aspirin 81 MG tablet Take 1 tablet (81 mg total) by mouth daily. For cardiovascular health.    . clonazePAM (KLONOPIN) 0.5 MG tablet TAKE 1 TABLET BY MOUTH THREE TIMES A DAY AS NEEDED 270 tablet 0  . furosemide (LASIX) 40 MG tablet TAKE 1 TABLET BY MOUTH EVERY DAY (Patient taking differently: TAKE 1 TABLET BY MOUTH EVERY DAY as needed for swelling) 30 tablet 0  . gabapentin (NEURONTIN) 100 MG capsule TAKE 1 CAPSULE BY MOUTH THREE TIMES A DAY 270 capsule 0  . losartan-hydrochlorothiazide (HYZAAR) 100-25 MG tablet TAKE 1 TABLET BY MOUTH EVERY DAY 90 tablet 2  . Multiple Vitamin (MULTIVITAMIN) tablet Take 1 tablet by mouth daily. Vitamin supplement.    . pantoprazole (PROTONIX) 40 MG tablet Take 30- 60 min before your first and last meals of the day 60 tablet 2  . sertraline (ZOLOFT) 100 MG tablet TAKE 1 TABLET BY MOUTH EVERY DAY 90 tablet 0  . traMADol (ULTRAM) 50 MG tablet Take 1-2 tablets (50-100 mg total) by mouth every 4 (four) hours as needed for moderate pain. 40 tablet 0   No current facility-administered medications on file prior to visit.     BP (!) 158/80 (BP Location: Right Arm, Patient Position: Sitting, Cuff Size: Normal)   Pulse 84   Temp 97.8 F (36.6 C) (Oral)   Wt 150 lb (68 kg)   SpO2 96%   BMI 27.44 kg/m      Review of Systems  Constitutional: Positive for fatigue and unexpected weight change.  HENT: Negative for congestion, dental problem, hearing loss, rhinorrhea, sinus  pressure, sore throat and tinnitus.   Eyes: Negative for pain, discharge and visual disturbance.  Respiratory: Positive for cough and shortness of breath.   Cardiovascular: Negative for chest pain, palpitations and leg swelling.  Gastrointestinal: Positive for nausea. Negative for abdominal distention, abdominal pain, blood in stool, constipation, diarrhea and vomiting.  Genitourinary: Negative for difficulty urinating, dysuria, flank pain, frequency, hematuria, pelvic pain, urgency, vaginal bleeding, vaginal discharge and vaginal pain.  Musculoskeletal: Negative for arthralgias, gait problem and joint swelling.  Skin: Negative for rash.  Neurological: Negative for dizziness, syncope, speech difficulty, weakness, numbness and headaches.  Hematological: Negative for adenopathy.  Psychiatric/Behavioral: Negative for agitation, behavioral problems and dysphoric mood. The patient is not nervous/anxious.        Objective:   Physical Exam  Constitutional: She is oriented to person, place, and time. She appears well-developed and well-nourished. No distress.  Weight 150 down from 164 Afebrile  HENT:  Head: Normocephalic.  Right Ear: External ear normal.  Left Ear: External ear normal.  Mouth/Throat: Oropharynx is clear and moist.  Eyes: Pupils are equal, round, and reactive to light. Conjunctivae and EOM are normal.  Neck: Normal range of motion. Neck supple. No thyromegaly present.  Cardiovascular: Normal rate, regular rhythm, normal heart sounds and intact distal pulses.  Pulmonary/Chest: Effort normal and breath sounds normal.  Abdominal: Soft. Bowel sounds are normal. She exhibits no mass. There is no tenderness.  Musculoskeletal: Normal range of motion.  Lymphadenopathy:    She has no cervical adenopathy.  Neurological: She is alert and oriented to person, place, and time.  Skin: Skin is warm and dry. No rash noted.  Psychiatric: She has a normal mood and affect. Her behavior is  normal.          Assessment & Plan:   Status post lung abscess.  Follow-up 2 months with final follow-up chest x-ray Essential hypertension History of esophageal stricture.  Follow-up GI Weight loss secondary to illness.  We will continue to observe.  This appears to have stabilized  Follow-up 3 months  Rogelia Boga

## 2018-02-17 NOTE — Patient Instructions (Addendum)
Return in 3 months for follow-up and repeat chest x-ray  Call or return to clinic prn if these symptoms worsen or fail to improve as anticipated.  Gastroenterology follow-up with Dr. [name] as scheduled

## 2018-02-28 ENCOUNTER — Other Ambulatory Visit: Payer: Self-pay | Admitting: Internal Medicine

## 2018-03-05 ENCOUNTER — Other Ambulatory Visit: Payer: Self-pay | Admitting: Internal Medicine

## 2018-03-18 ENCOUNTER — Ambulatory Visit: Payer: Medicare Other | Admitting: Internal Medicine

## 2018-03-18 ENCOUNTER — Encounter: Payer: Self-pay | Admitting: Internal Medicine

## 2018-03-18 ENCOUNTER — Encounter

## 2018-03-18 VITALS — BP 126/78 | HR 76 | Ht 62.0 in | Wt 151.1 lb

## 2018-03-18 DIAGNOSIS — Z9889 Other specified postprocedural states: Secondary | ICD-10-CM

## 2018-03-18 DIAGNOSIS — R110 Nausea: Secondary | ICD-10-CM | POA: Diagnosis not present

## 2018-03-18 DIAGNOSIS — R1310 Dysphagia, unspecified: Secondary | ICD-10-CM | POA: Diagnosis not present

## 2018-03-18 DIAGNOSIS — K219 Gastro-esophageal reflux disease without esophagitis: Secondary | ICD-10-CM | POA: Diagnosis not present

## 2018-03-18 MED ORDER — ONDANSETRON HCL 4 MG PO TABS: ORAL_TABLET | ORAL | 3 refills | Status: DC

## 2018-03-18 NOTE — Progress Notes (Signed)
HISTORY OF PRESENT ILLNESS:  Julie Rice is a 82 y.o. female with GERD status post fundoplication who presents today after recent bout of pneumonia and at the recommendation of her pulmonologist regarding the same. Patient has a history of symptomatic peptic stricture as well as very large symptomatic hiatal hernia for which she underwent Nissen fundoplication September 2017. Thereafter complained of dysphagia. Barium esophagram in November was unremarkable. Because of ongoing complaints she underwent upper endoscopy with balloon dilation of the distal esophagus to 20 mm April 2018. No improvement. She has continued with complaints of dysphagia to liquids and solids with regurgitation and occasional cough. Also problems with nausea. She feels that symptoms have progressed. She does take pantoprazole daily. Limited supply of Zofran was helpful for her nausea. She requests a refill. No additional significant GI complaints. She did undergo colonoscopy October 2018 to evaluate right upper quadrant pain and iron deficiency anemia. Examination including intubation of the ileum was normal. Blood work from 02/01/2018 shows hemoglobin 11.9 with MCV 90.8. Normal liver tests. CT scan of the abdomen and pelvis October 2018 revealed hiatal hernia. No other abnormalities.  REVIEW OF SYSTEMS:  All non-GI ROS negative unless otherwise stated in the history of present illness except for anxiety, arthritis, cough, depression, fatigue, muscle cramps, sleeping problems, sore throat, swelling of the legs, urinary leakage, shortness of breath  Past Medical History:  Diagnosis Date  . Anemia   . Anxiety   . Arthritis    ra and oa  . Bipolar disorder (HCC)   . Blood transfusion 1991   autologous pts own blood given   . CAD (coronary artery disease)   . Chest pain    "@ rest, lying down, w/exertion"  . COPD (chronic obstructive pulmonary disease) (HCC)   . Depression   . Dyspnea   . Esophageal stricture   .  Fecal occult blood test positive   . Fibromyalgia   . GERD (gastroesophageal reflux disease)   . Hiatal hernia   . Hyperlipidemia   . Hypertension   . Internal hemorrhoids   . Lumbago   . Mitral regurgitation   . PONV (postoperative nausea and vomiting)    severe ponv, did ok with last surgery  . Rectal bleeding   . SOB (shortness of breath)    with exertion  . Spondylosis   . TIA (transient ischemic attack) 2013  . Urinary incontinence    wears depends    Past Surgical History:  Procedure Laterality Date  . ABDOMINAL HYSTERECTOMY  1982  . BLADDER SUSPENSION  1980's  . CATARACT EXTRACTION W/ INTRAOCULAR LENS  IMPLANT, BILATERAL Bilateral 2010  . DILATION AND CURETTAGE OF UTERUS  1961  . HIATAL HERNIA REPAIR N/A 08/02/2016   Procedure: LAPAROSCOPIC REPAIR OF LARGE  HIATAL HERNIA;  Surgeon: Glenna Fellows, MD;  Location: [facility];  Service: General;  Laterality: N/A;  . LAPAROSCOPIC NISSEN FUNDOPLICATION N/A 08/02/2016   Procedure: LAPAROSCOPIC NISSEN FUNDOPLICATION;  Surgeon: Glenna Fellows, MD;  Location: [facility];  Service: General;  Laterality: N/A;  . LUMBAR LAMINECTOMY  1990; 1994; [id number]   "I've got 2 stainless steel rods; 6 screws; 2 ray cages"took bone from right hip to put in back  . NISSEN FUNDOPLICATION  10/2016  . TONSILLECTOMY AND ADENOIDECTOMY  1945  . TOTAL KNEE ARTHROPLASTY Right ~ 1996  . TUBAL LIGATION  ~ 1976    Social History Julie Rice  reports that she has never smoked. She has never used smokeless tobacco. She reports  that she does not drink alcohol or use drugs.  family history includes Heart disease (age of onset: 80) in her father; Hypertension in her mother; Kidney disease in her mother; Suicidality in her son.  Allergies  Allergen Reactions  . Metaxalone   . Morphine     Flushing, rash, itching  . Tape Other (See Comments)    Band aides, adhesive tape Redness and pulls skin off       PHYSICAL EXAMINATION: Vital signs: BP  126/78 (BP Location: Left Arm, Patient Position: Sitting, Cuff Size: Normal)   Pulse 76   Ht 5\' 2"  (1.575 m)   Wt 151 lb 2 oz (68.5 kg)   BMI 27.64 kg/m   Constitutional: generally well-appearing, no acute distress Psychiatric: alert and oriented x3, cooperative Eyes: extraocular movements intact, anicteric, conjunctiva pink Mouth: oral pharynx moist, no lesions Neck: supple no lymphadenopathy Cardiovascular: heart regular rate and rhythm, no murmur Lungs: clear to auscultation bilaterally Abdomen: soft, nontender, nondistended, no obvious ascites, no peritoneal signs, normal bowel sounds, no organomegaly Rectal:omitted Extremities: no lower extremity edema bilaterally Skin: no lesions on visible extremities Neuro: No focal deficits. Cranial nerves intact  ASSESSMENT:  #1. Ongoing problems with dysphagia. Negative esophagram November 2017. No response to EGD with balloon dilation April 2018. Rule out anatomic abnormality post fundoplication. Rule out esophageal motility disorder post fundoplication. #2. GERD complicated by peptic stricture and large hiatal hernia status post fundoplication September 2017.  #3. Nausea. Zofran helps. Needs refill #4. Aged out  of colorectal screening. Negative colonoscopy October 2018   PLAN:  #1. Continue PPI #2. Barium esophagram with tablet. Compare to previous for change #3. Esophageal manometry scheduled at Hospital. #4. Zofran 4 mg; #60; multiple refills; take 1 every 4-6 hours as needed for severe nausea #5. GI office follow-up after the above completed  25 minutes spent face-to-face with the patient. Greater than 50% a time use for counseling regarding her, complicated GERD and swallowing issues as well as nausea

## 2018-03-18 NOTE — Patient Instructions (Signed)
We have sent the following medications to your pharmacy for you to pick up at your convenience:  Zofran   You have been scheduled for a Barium Esophogram at [facility] (1st floor of the hospital) on 03/24/2018 at 11:00am. Please arrive 15 minutes prior to your appointment for registration. Make certain not to have anything to eat or drink 3 hours prior to your test. If you need to reschedule for any reason, please contact radiology at [phone] to do so. __________________________________________________________________ A barium swallow is an examination that concentrates on views of the esophagus. This tends to be a double contrast exam (barium and two liquids which, when combined, create a gas to distend the wall of the oesophagus) or single contrast (non-ionic iodine based). The study is usually tailored to your symptoms so a good history is essential. Attention is paid during the study to the form, structure and configuration of the esophagus, looking for functional disorders (such as aspiration, dysphagia, achalasia, motility and reflux) EXAMINATION You may be asked to change into a gown, depending on the type of swallow being performed. A radiologist and radiographer will perform the procedure. The radiologist will advise you of the type of contrast selected for your procedure and direct you during the exam. You will be asked to stand, sit or lie in several different positions and to hold a small amount of fluid in your mouth before being asked to swallow while the imaging is performed .In some instances you may be asked to swallow barium coated marshmallows to assess the motility of a solid food bolus. The exam can be recorded as a digital or video fluoroscopy procedure. POST PROCEDURE It will take 1-2 days for the barium to pass through your system. To facilitate this, it is important, unless otherwise directed, to increase your fluids for the next 24-48hrs and to resume your normal  diet.  This test typically takes about 30 minutes to perform. __________________________________________________________________________________   Julie Rice have been scheduled for an esophageal manometry at [facility] on 03/25/2018 at 12:29m. Please arrive 30 minutes prior to your procedure for registration. You will need to go to outpatient registration (1st floor of the hospital) first. Make certain to bring your insurance cards as well as a complete list of medications.  Please remember the following:  1) Do not take any muscle relaxants, xanax (alprazolam) or ativan for 1 day prior to your     test as well as the day of the test.  2) Nothing to eat or drink after 12:00 midnight on the night before your test.  3) Hold all diabetic medications/insulin the morning of the test. You may eat and take             your medications after the test.  It will take at least 2 weeks to receive the results of this test from your physician. ------------------------------------------ ABOUT ESOPHAGEAL MANOMETRY Esophageal manometry (muh-NOM-uh-tree) is a test that gauges how well your esophagus works. Your esophagus is the long, muscular tube that connects your throat to your stomach. Esophageal manometry measures the rhythmic muscle contractions (peristalsis) that occur in your esophagus when you swallow. Esophageal manometry also measures the coordination and force exerted by the muscles of your esophagus.  During esophageal manometry, a thin, flexible tube (catheter) that contains sensors is passed through your nose, down your esophagus and into your stomach. Esophageal manometry can be helpful in diagnosing some mostly uncommon disorders that affect your esophagus.  Why it's done Esophageal manometry  is used to evaluate the movement (motility) of food through the esophagus and into the stomach. The test measures how well the circular bands of muscle (sphincters) at the top and bottom of your  esophagus open and close, as well as the pressure, strength and pattern of the wave of esophageal muscle contractions that moves food along.  What you can expect Esophageal manometry is an outpatient procedure done without sedation. Most people tolerate it well. You may be asked to change into a hospital gown before the test starts.  During esophageal manometry  While you are sitting up, a member of your health care team sprays your throat with a numbing medication or puts numbing gel in your nose or both.  A catheter is guided through your nose into your esophagus. The catheter may be sheathed in a water-filled sleeve. It doesn't interfere with your breathing. However, your eyes may water, and you may gag. You may have a slight nosebleed from irritation.  After the catheter is in place, you may be asked to lie on your back on an exam table, or you may be asked to remain seated.  You then swallow small sips of water. As you do, a computer connected to the catheter records the pressure, strength and pattern of your esophageal muscle contractions.  During the test, you'll be asked to breathe slowly and smoothly, remain as still as possible, and swallow only when you're asked to do so.  A member of your health care team may move the catheter down into your stomach while the catheter continues its measurements.  The catheter then is slowly withdrawn. The test usually lasts 20 to 30 minutes.  After esophageal manometry  When your esophageal manometry is complete, you may return to your normal activities  This test typically takes 30-45 minutes to complete. ________________________________________________________________________________

## 2018-03-24 ENCOUNTER — Ambulatory Visit (HOSPITAL_COMMUNITY)
Admission: RE | Admit: 2018-03-24 | Discharge: 2018-03-24 | Disposition: A | Discharge status: Home / Self Care | Payer: Medicare Other | Source: Ambulatory Visit | Attending: Internal Medicine | Admitting: Internal Medicine

## 2018-03-24 DIAGNOSIS — R110 Nausea: Secondary | ICD-10-CM | POA: Diagnosis present

## 2018-03-24 DIAGNOSIS — K449 Diaphragmatic hernia without obstruction or gangrene: Secondary | ICD-10-CM | POA: Diagnosis not present

## 2018-03-24 DIAGNOSIS — R1310 Dysphagia, unspecified: Secondary | ICD-10-CM

## 2018-03-25 ENCOUNTER — Encounter (HOSPITAL_COMMUNITY)
Admission: RE | Disposition: A | Discharge status: Home / Self Care | Payer: Self-pay | Source: Ambulatory Visit | Attending: Gastroenterology

## 2018-03-25 ENCOUNTER — Ambulatory Visit (HOSPITAL_COMMUNITY)
Admission: RE | Admit: 2018-03-25 | Discharge: 2018-03-25 | Disposition: A | Discharge status: Home / Self Care | Payer: Medicare Other | Source: Ambulatory Visit | Attending: Gastroenterology | Admitting: Gastroenterology

## 2018-03-25 DIAGNOSIS — R1310 Dysphagia, unspecified: Secondary | ICD-10-CM | POA: Diagnosis not present

## 2018-03-25 DIAGNOSIS — R1110 Vomiting, unspecified: Secondary | ICD-10-CM | POA: Diagnosis not present

## 2018-03-25 DIAGNOSIS — R12 Heartburn: Secondary | ICD-10-CM | POA: Diagnosis not present

## 2018-03-25 HISTORY — PX: ESOPHAGEAL MANOMETRY: SHX5429

## 2018-03-25 SURGERY — MANOMETRY, ESOPHAGUS

## 2018-03-25 MED ORDER — LIDOCAINE VISCOUS HCL 2 % MT SOLN
OROMUCOSAL | Status: AC
  Filled 2018-03-25: qty 15

## 2018-03-25 SURGICAL SUPPLY — 2 items
FACESHIELD LNG OPTICON STERILE (SAFETY) IMPLANT
GLOVE BIO SURGEON STRL SZ8 (GLOVE) ×4 IMPLANT

## 2018-03-25 NOTE — Progress Notes (Signed)
Esophageal manometry placed per protocol without complications.  Patient tolerated procedure well.  Results to be read by Dr. Lavon Paganini.

## 2018-03-26 ENCOUNTER — Encounter (HOSPITAL_COMMUNITY): Payer: Self-pay | Admitting: Gastroenterology

## 2018-03-28 ENCOUNTER — Other Ambulatory Visit: Payer: Self-pay | Admitting: Internal Medicine

## 2018-03-31 ENCOUNTER — Telehealth: Payer: Self-pay

## 2018-03-31 NOTE — Telephone Encounter (Signed)
Pt already scheduled to see Dr. [name] 05/19/18@1 :45pm.

## 2018-03-31 NOTE — Telephone Encounter (Signed)
-----   Message from Hilarie Fredrickson, MD sent at 03/31/2018  1:10 PM EDT ----- Regarding: Office follow-up I spoke with Dr. Lavon Paganini. Just have this patient follow-up with me in the office to discuss her results. Thanks. Routine office follow-up is fine

## 2018-04-01 ENCOUNTER — Ambulatory Visit (INDEPENDENT_AMBULATORY_CARE_PROVIDER_SITE_OTHER)
Admission: RE | Admit: 2018-04-01 | Discharge: 2018-04-01 | Disposition: A | Discharge status: Home / Self Care | Payer: Medicare Other | Source: Ambulatory Visit | Attending: Internal Medicine | Admitting: Internal Medicine

## 2018-04-01 ENCOUNTER — Ambulatory Visit: Payer: Medicare Other | Admitting: Internal Medicine

## 2018-04-01 ENCOUNTER — Encounter: Payer: Self-pay | Admitting: Internal Medicine

## 2018-04-01 VITALS — BP 150/60 | HR 98 | Temp 98.6°F | Wt 151.0 lb

## 2018-04-01 DIAGNOSIS — J851 Abscess of lung with pneumonia: Secondary | ICD-10-CM

## 2018-04-01 DIAGNOSIS — K222 Esophageal obstruction: Secondary | ICD-10-CM

## 2018-04-01 DIAGNOSIS — R112 Nausea with vomiting, unspecified: Secondary | ICD-10-CM

## 2018-04-01 DIAGNOSIS — R05 Cough: Secondary | ICD-10-CM | POA: Diagnosis not present

## 2018-04-01 NOTE — Patient Instructions (Addendum)
Follow-up chest x-ray as discussed    Take over-the-counter expectorants and cough medications such as  Mucinex DM.  Call if there is no improvement in 5 to 7 days or if  you develop worsening cough, fever, or new symptoms, such as shortness of breath or chest pain.  Hydrate and Humidify  Drink enough water to keep your urine clear or pale yellow. Staying hydrated will help to thin your mucus.  Use a cool mist humidifier to keep the humidity level in your home above 50%.  Inhale steam for 10-15 minutes, 3-4 times a day or as told by your health care provider. You can do this in the bathroom while a hot shower is running.  Limit your exposure to cool or dry air. Rest  Rest as much as possible.

## 2018-04-01 NOTE — Progress Notes (Signed)
Subjective:    Patient ID: [name], female    DOB: 04-28-1936, 82 y.o.   MRN: [id number]  HPI  82 year old patient who was treated earlier this year for a lung abscess involving the right upper lobe.  She has been evaluated by GI due to swallowing difficulties.  She has a history of esophageal stricture and has required dilatations in the past. She complains of persistent cough that she states has been unchanged since January.  Denies any fever.  Cough is mildly productive and essentially clear.  She continues to have difficulty with swallowing with episodes of nausea and vomiting associated with dysphagia  Past Medical History:  Diagnosis Date  . Anemia   . Anxiety   . Arthritis    ra and oa  . Bipolar disorder (HCC)   . Blood transfusion 1991   autologous pts own blood given   . CAD (coronary artery disease)   . Chest pain    "@ rest, lying down, w/exertion"  . COPD (chronic obstructive pulmonary disease) (HCC)   . Depression   . Dyspnea   . Esophageal stricture   . Fecal occult blood test positive   . Fibromyalgia   . GERD (gastroesophageal reflux disease)   . Hiatal hernia   . Hyperlipidemia   . Hypertension   . Internal hemorrhoids   . Lumbago   . Mitral regurgitation   . PONV (postoperative nausea and vomiting)    severe ponv, did ok with last surgery  . Rectal bleeding   . SOB (shortness of breath)    with exertion  . Spondylosis   . TIA (transient ischemic attack) 2013  . Urinary incontinence    wears depends     Social History   Socioeconomic History  . Marital status: Widowed    Spouse name: Not on file  . Number of children: 2 D  . Years of education: Not on file  . Highest education level: Not on file  Occupational History    Employer: RETIRED  Social Needs  . Financial resource strain: Not on file  . Food insecurity:    Worry: Not on file    Inability: Not on file  . Transportation needs:    Medical: Not on file    Non-medical: Not  on file  Tobacco Use  . Smoking status: Never Smoker  . Smokeless tobacco: Never Used  Substance and Sexual Activity  . Alcohol use: No  . Drug use: No  . Sexual activity: Not Currently  Lifestyle  . Physical activity:    Days per week: Not on file    Minutes per session: Not on file  . Stress: Not on file  Relationships  . Social connections:    Talks on phone: Not on file    Gets together: Not on file    Attends religious service: Not on file    Active member of club or organization: Not on file    Attends meetings of clubs or organizations: Not on file    Relationship status: Not on file  . Intimate partner violence:    Fear of current or ex partner: Not on file    Emotionally abused: Not on file    Physically abused: Not on file    Forced sexual activity: Not on file  Other Topics Concern  . Not on file  Social History Narrative  . Not on file    Past Surgical History:  Procedure Laterality Date  . ABDOMINAL HYSTERECTOMY  1982  . BLADDER SUSPENSION  1980's  . CATARACT EXTRACTION W/ INTRAOCULAR LENS  IMPLANT, BILATERAL Bilateral 2010  . DILATION AND CURETTAGE OF UTERUS  1961  . ESOPHAGEAL MANOMETRY N/A 03/25/2018   Procedure: ESOPHAGEAL MANOMETRY (EM);  Surgeon: Napoleon Form, MD;  Location: [facility];  Service: Endoscopy;  Laterality: N/A;  . HIATAL HERNIA REPAIR N/A 08/02/2016   Procedure: LAPAROSCOPIC REPAIR OF LARGE  HIATAL HERNIA;  Surgeon: Glenna Fellows, MD;  Location: [facility];  Service: General;  Laterality: N/A;  . LAPAROSCOPIC NISSEN FUNDOPLICATION N/A 08/02/2016   Procedure: LAPAROSCOPIC NISSEN FUNDOPLICATION;  Surgeon: Glenna Fellows, MD;  Location: [facility];  Service: General;  Laterality: N/A;  . LUMBAR LAMINECTOMY  1990; 1994; 2831;5176   "I've got 2 stainless steel rods; 6 screws; 2 ray cages"took bone from right hip to put in back  . NISSEN FUNDOPLICATION  10/2016  . TONSILLECTOMY AND ADENOIDECTOMY  1945  . TOTAL KNEE ARTHROPLASTY Right ~  1996  . TUBAL LIGATION  ~ 1976    Family History  Problem Relation Age of Onset  . Kidney disease Mother   . Hypertension Mother   . Heart disease Father 47       MI  . Suicidality Son   . Colon cancer Neg Hx   . Esophageal cancer Neg Hx   . Pancreatic cancer Neg Hx     Allergies  Allergen Reactions  . Metaxalone   . Morphine     Flushing, rash, itching  . Tape Other (See Comments)    Band aides, adhesive tape Redness and pulls skin off    Current Outpatient Medications on File Prior to Visit  Medication Sig Dispense Refill  . amLODipine (NORVASC) 2.5 MG tablet Take 1 tablet (2.5 mg total) by mouth daily. 90 tablet 3  . aspirin 81 MG tablet Take 1 tablet (81 mg total) by mouth daily. For cardiovascular health.    . clonazePAM (KLONOPIN) 0.5 MG tablet TAKE 1 TABLET BY MOUTH 3 TIMES A DAY AS NEEDED 270 tablet 0  . gabapentin (NEURONTIN) 100 MG capsule TAKE 1 CAPSULE BY MOUTH THREE TIMES A DAY 270 capsule 0  . losartan-hydrochlorothiazide (HYZAAR) 100-25 MG tablet TAKE 1 TABLET BY MOUTH EVERY DAY 90 tablet 2  . Multiple Vitamin (MULTIVITAMIN) tablet Take 1 tablet by mouth daily. Vitamin supplement.    . ondansetron (ZOFRAN) 4 MG tablet Take one tablet every 4-6 hours as needed for nausea 60 tablet 3  . pantoprazole (PROTONIX) 40 MG tablet TAKE 1 TABLET BY MOUTH 3O TO 60 MIN BEFORE YOUR FIRST AND LAST MEALS OF THE DAY 60 tablet 2  . sertraline (ZOLOFT) 100 MG tablet TAKE 1 TABLET BY MOUTH EVERY DAY 90 tablet 0  . traMADol (ULTRAM) 50 MG tablet Take 1-2 tablets (50-100 mg total) by mouth every 4 (four) hours as needed for moderate pain. 40 tablet 0   No current facility-administered medications on file prior to visit.     BP (!) 150/60 (BP Location: Left Arm, Patient Position: Sitting)   Pulse 98   Temp 98.6 F (37 C) (Oral)   Wt 151 lb (68.5 kg)   SpO2 93%   BMI 27.62 kg/m     Review of Systems  Constitutional: Negative.   HENT: Negative for congestion, dental  problem, hearing loss, rhinorrhea, sinus pressure, sore throat and tinnitus.   Eyes: Negative for pain, discharge and visual disturbance.  Respiratory: Positive for cough. Negative for shortness of breath.   Cardiovascular: Negative for  chest pain, palpitations and leg swelling.  Gastrointestinal: Positive for nausea and vomiting. Negative for abdominal distention, abdominal pain, blood in stool, constipation and diarrhea.  Genitourinary: Negative for difficulty urinating, dysuria, flank pain, frequency, hematuria, pelvic pain, urgency, vaginal bleeding, vaginal discharge and vaginal pain.  Musculoskeletal: Negative for arthralgias, gait problem and joint swelling.  Skin: Negative for rash.  Neurological: Negative for dizziness, syncope, speech difficulty, weakness, numbness and headaches.  Hematological: Negative for adenopathy.  Psychiatric/Behavioral: Negative for agitation, behavioral problems and dysphoric mood. The patient is not nervous/anxious.        Objective:   Physical Exam  Constitutional: She is oriented to person, place, and time. She appears well-developed and well-nourished. No distress.  No distress Weight stable Afebrile  HENT:  Head: Normocephalic.  Right Ear: External ear normal.  Left Ear: External ear normal.  Mouth/Throat: Oropharynx is clear and moist.  Eyes: Pupils are equal, round, and reactive to light. Conjunctivae and EOM are normal.  Neck: Normal range of motion. Neck supple. No thyromegaly present.  Cardiovascular: Normal rate, regular rhythm, normal heart sounds and intact distal pulses.  Pulmonary/Chest: Effort normal.  Extensive rales involving the left lower hemithorax Few crackles at the right base  Abdominal: Soft. Bowel sounds are normal. She exhibits no mass. There is no tenderness.  Musculoskeletal: Normal range of motion.  Lymphadenopathy:    She has no cervical adenopathy.  Neurological: She is alert and oriented to person, place, and  time.  Skin: Skin is warm and dry. No rash noted.  Psychiatric: She has a normal mood and affect. Her behavior is normal.          Assessment & Plan:   History of right upper lobe lung abscess Dysphagia.  GI evaluation in progress Rule out recurrent aspiration pneumonitis  Will review a chest x-ray and CBC Continue expectorants hydration  Rogelia Boga

## 2018-04-02 ENCOUNTER — Telehealth: Payer: Self-pay | Admitting: Family Medicine

## 2018-04-02 LAB — CBC WITH DIFFERENTIAL/PLATELET
BASOS PCT: 0.1 % (ref 0.0–3.0)
Basophils Absolute: 0 10*3/uL (ref 0.0–0.1)
EOS ABS: 0.4 10*3/uL (ref 0.0–0.7)
EOS PCT: 2.1 % (ref 0.0–5.0)
HEMATOCRIT: 34.1 % — AB (ref 36.0–46.0)
Hemoglobin: 11.1 g/dL — ABNORMAL LOW (ref 12.0–15.0)
LYMPHS PCT: 5.5 % — AB (ref 12.0–46.0)
Lymphs Abs: 1 10*3/uL (ref 0.7–4.0)
MCHC: 32.7 g/dL (ref 30.0–36.0)
MCV: 88 fl (ref 78.0–100.0)
MONOS PCT: 3.2 % (ref 3.0–12.0)
Monocytes Absolute: 0.6 10*3/uL (ref 0.1–1.0)
NEUTROS ABS: 16.3 10*3/uL — AB (ref 1.4–7.7)
Neutrophils Relative %: 89.1 % — ABNORMAL HIGH (ref 43.0–77.0)
PLATELETS: 401 10*3/uL — AB (ref 150.0–400.0)
RBC: 3.88 Mil/uL (ref 3.87–5.11)
RDW: 14.1 % (ref 11.5–15.5)
WBC: 18.3 10*3/uL (ref 4.0–10.5)

## 2018-04-02 NOTE — Telephone Encounter (Signed)
Dr.Banks will try to handle this matter between her pt.

## 2018-04-02 NOTE — Telephone Encounter (Signed)
Call report-critical lab WBC 18.3, Dr. Kirtland Bouchard is not in the office today will ask another provider to review.

## 2018-04-02 NOTE — Telephone Encounter (Signed)
Dr.Banks tried to help but had many questions about the issue that she couldn't help as she thought. She did however, past the issue along to Dr.Todd. After reviewing the information Dr.Todd final advice was to make sure the pt schedule an ov between 10-14 days. I was concerned so I had the pt come in Monday as early as possible due to the pt history.

## 2018-04-03 ENCOUNTER — Other Ambulatory Visit: Payer: Self-pay

## 2018-04-03 MED ORDER — AZITHROMYCIN 250 MG PO TABS: ORAL_TABLET | ORAL | 0 refills | Status: DC

## 2018-04-03 NOTE — Telephone Encounter (Signed)
Pt will still need to come in. Pt rescheduled.

## 2018-04-03 NOTE — Telephone Encounter (Signed)
Azithromycin 250  #6  2 daily for 3 days

## 2018-04-03 NOTE — Telephone Encounter (Signed)
Antibiotic sent to pharmacy. Called pt to cancel appointment for Monday.

## 2018-04-03 NOTE — Telephone Encounter (Signed)
Please have patient keep the Monday appointment. PK

## 2018-04-05 ENCOUNTER — Inpatient Hospital Stay (HOSPITAL_COMMUNITY)
Admission: EM | Admit: 2018-04-05 | Discharge: 2018-04-09 | DRG: 393 | Disposition: A | Discharge status: Home / Self Care | Payer: Medicare Other | Attending: Family Medicine | Admitting: Family Medicine

## 2018-04-05 ENCOUNTER — Emergency Department (HOSPITAL_COMMUNITY): Payer: Medicare Other

## 2018-04-05 ENCOUNTER — Other Ambulatory Visit: Payer: Self-pay

## 2018-04-05 DIAGNOSIS — E785 Hyperlipidemia, unspecified: Secondary | ICD-10-CM | POA: Diagnosis not present

## 2018-04-05 DIAGNOSIS — Z9842 Cataract extraction status, left eye: Secondary | ICD-10-CM

## 2018-04-05 DIAGNOSIS — R634 Abnormal weight loss: Secondary | ICD-10-CM

## 2018-04-05 DIAGNOSIS — K9189 Other postprocedural complications and disorders of digestive system: Secondary | ICD-10-CM | POA: Diagnosis not present

## 2018-04-05 DIAGNOSIS — I2510 Atherosclerotic heart disease of native coronary artery without angina pectoris: Secondary | ICD-10-CM | POA: Diagnosis not present

## 2018-04-05 DIAGNOSIS — E876 Hypokalemia: Secondary | ICD-10-CM | POA: Diagnosis not present

## 2018-04-05 DIAGNOSIS — G8929 Other chronic pain: Secondary | ICD-10-CM | POA: Diagnosis present

## 2018-04-05 DIAGNOSIS — E440 Moderate protein-calorie malnutrition: Secondary | ICD-10-CM

## 2018-04-05 DIAGNOSIS — K219 Gastro-esophageal reflux disease without esophagitis: Secondary | ICD-10-CM | POA: Diagnosis not present

## 2018-04-05 DIAGNOSIS — R32 Unspecified urinary incontinence: Secondary | ICD-10-CM | POA: Diagnosis present

## 2018-04-05 DIAGNOSIS — Q399 Congenital malformation of esophagus, unspecified: Secondary | ICD-10-CM | POA: Diagnosis not present

## 2018-04-05 DIAGNOSIS — I10 Essential (primary) hypertension: Secondary | ICD-10-CM | POA: Diagnosis present

## 2018-04-05 DIAGNOSIS — Z8701 Personal history of pneumonia (recurrent): Secondary | ICD-10-CM

## 2018-04-05 DIAGNOSIS — F329 Major depressive disorder, single episode, unspecified: Secondary | ICD-10-CM | POA: Diagnosis present

## 2018-04-05 DIAGNOSIS — K920 Hematemesis: Secondary | ICD-10-CM | POA: Diagnosis present

## 2018-04-05 DIAGNOSIS — Z79899 Other long term (current) drug therapy: Secondary | ICD-10-CM

## 2018-04-05 DIAGNOSIS — Z885 Allergy status to narcotic agent status: Secondary | ICD-10-CM

## 2018-04-05 DIAGNOSIS — Z961 Presence of intraocular lens: Secondary | ICD-10-CM | POA: Diagnosis present

## 2018-04-05 DIAGNOSIS — D638 Anemia in other chronic diseases classified elsewhere: Secondary | ICD-10-CM | POA: Diagnosis present

## 2018-04-05 DIAGNOSIS — J189 Pneumonia, unspecified organism: Secondary | ICD-10-CM

## 2018-04-05 DIAGNOSIS — K449 Diaphragmatic hernia without obstruction or gangrene: Secondary | ICD-10-CM | POA: Diagnosis not present

## 2018-04-05 DIAGNOSIS — Z9841 Cataract extraction status, right eye: Secondary | ICD-10-CM

## 2018-04-05 DIAGNOSIS — K224 Dyskinesia of esophagus: Secondary | ICD-10-CM

## 2018-04-05 DIAGNOSIS — Z90710 Acquired absence of both cervix and uterus: Secondary | ICD-10-CM

## 2018-04-05 DIAGNOSIS — Z8673 Personal history of transient ischemic attack (TIA), and cerebral infarction without residual deficits: Secondary | ICD-10-CM

## 2018-04-05 DIAGNOSIS — Z8249 Family history of ischemic heart disease and other diseases of the circulatory system: Secondary | ICD-10-CM

## 2018-04-05 DIAGNOSIS — M8580 Other specified disorders of bone density and structure, unspecified site: Secondary | ICD-10-CM | POA: Diagnosis present

## 2018-04-05 DIAGNOSIS — R1013 Epigastric pain: Secondary | ICD-10-CM | POA: Diagnosis not present

## 2018-04-05 DIAGNOSIS — R1110 Vomiting, unspecified: Secondary | ICD-10-CM | POA: Diagnosis not present

## 2018-04-05 DIAGNOSIS — Z888 Allergy status to other drugs, medicaments and biological substances status: Secondary | ICD-10-CM

## 2018-04-05 DIAGNOSIS — M797 Fibromyalgia: Secondary | ICD-10-CM | POA: Diagnosis not present

## 2018-04-05 DIAGNOSIS — F411 Generalized anxiety disorder: Secondary | ICD-10-CM | POA: Diagnosis present

## 2018-04-05 DIAGNOSIS — Z66 Do not resuscitate: Secondary | ICD-10-CM | POA: Diagnosis not present

## 2018-04-05 DIAGNOSIS — M1990 Unspecified osteoarthritis, unspecified site: Secondary | ICD-10-CM | POA: Diagnosis present

## 2018-04-05 DIAGNOSIS — Z79891 Long term (current) use of opiate analgesic: Secondary | ICD-10-CM

## 2018-04-05 DIAGNOSIS — R110 Nausea: Secondary | ICD-10-CM | POA: Diagnosis not present

## 2018-04-05 DIAGNOSIS — Z841 Family history of disorders of kidney and ureter: Secondary | ICD-10-CM

## 2018-04-05 DIAGNOSIS — Z91048 Other nonmedicinal substance allergy status: Secondary | ICD-10-CM

## 2018-04-05 DIAGNOSIS — Z791 Long term (current) use of non-steroidal anti-inflammatories (NSAID): Secondary | ICD-10-CM

## 2018-04-05 DIAGNOSIS — Z96651 Presence of right artificial knee joint: Secondary | ICD-10-CM | POA: Diagnosis present

## 2018-04-05 DIAGNOSIS — K222 Esophageal obstruction: Secondary | ICD-10-CM | POA: Diagnosis present

## 2018-04-05 DIAGNOSIS — R918 Other nonspecific abnormal finding of lung field: Secondary | ICD-10-CM | POA: Diagnosis not present

## 2018-04-05 DIAGNOSIS — Z7982 Long term (current) use of aspirin: Secondary | ICD-10-CM

## 2018-04-05 DIAGNOSIS — I447 Left bundle-branch block, unspecified: Secondary | ICD-10-CM | POA: Diagnosis not present

## 2018-04-05 DIAGNOSIS — E039 Hypothyroidism, unspecified: Secondary | ICD-10-CM | POA: Diagnosis not present

## 2018-04-05 DIAGNOSIS — Y838 Other surgical procedures as the cause of abnormal reaction of the patient, or of later complication, without mention of misadventure at the time of the procedure: Secondary | ICD-10-CM | POA: Diagnosis present

## 2018-04-05 DIAGNOSIS — J439 Emphysema, unspecified: Secondary | ICD-10-CM | POA: Diagnosis present

## 2018-04-05 DIAGNOSIS — R011 Cardiac murmur, unspecified: Secondary | ICD-10-CM | POA: Diagnosis present

## 2018-04-05 DIAGNOSIS — R1310 Dysphagia, unspecified: Secondary | ICD-10-CM | POA: Diagnosis not present

## 2018-04-05 DIAGNOSIS — J690 Pneumonitis due to inhalation of food and vomit: Secondary | ICD-10-CM | POA: Diagnosis not present

## 2018-04-05 DIAGNOSIS — R112 Nausea with vomiting, unspecified: Secondary | ICD-10-CM | POA: Diagnosis not present

## 2018-04-05 DIAGNOSIS — I700 Atherosclerosis of aorta: Secondary | ICD-10-CM | POA: Diagnosis present

## 2018-04-05 DIAGNOSIS — R1111 Vomiting without nausea: Secondary | ICD-10-CM | POA: Diagnosis not present

## 2018-04-05 DIAGNOSIS — K5900 Constipation, unspecified: Secondary | ICD-10-CM | POA: Diagnosis present

## 2018-04-05 DIAGNOSIS — D649 Anemia, unspecified: Secondary | ICD-10-CM | POA: Diagnosis not present

## 2018-04-05 HISTORY — DX: Dorsalgia, unspecified: M54.9

## 2018-04-05 HISTORY — DX: Cardiac murmur, unspecified: R01.1

## 2018-04-05 HISTORY — DX: Other chronic pain: G89.29

## 2018-04-05 HISTORY — DX: Unspecified osteoarthritis, unspecified site: M19.90

## 2018-04-05 HISTORY — DX: Rheumatoid arthritis, unspecified: M06.9

## 2018-04-05 HISTORY — DX: Pneumonia, unspecified organism: J18.9

## 2018-04-05 LAB — LIPASE, BLOOD: LIPASE: 25 U/L (ref 11–51)

## 2018-04-05 LAB — COMPREHENSIVE METABOLIC PANEL
ALT: 12 U/L — AB (ref 14–54)
AST: 18 U/L (ref 15–41)
Albumin: 3.4 g/dL — ABNORMAL LOW (ref 3.5–5.0)
Alkaline Phosphatase: 70 U/L (ref 38–126)
Anion gap: 10 (ref 5–15)
BUN: 12 mg/dL (ref 6–20)
CHLORIDE: 99 mmol/L — AB (ref 101–111)
CO2: 28 mmol/L (ref 22–32)
CREATININE: 0.9 mg/dL (ref 0.44–1.00)
Calcium: 8.6 mg/dL — ABNORMAL LOW (ref 8.9–10.3)
GFR calc Af Amer: >60 mL/min (ref >60)
GFR calc non Af Amer: 59 mL/min — ABNORMAL LOW (ref >60)
Glucose, Bld: 97 mg/dL (ref 65–99)
POTASSIUM: 3.1 mmol/L — AB (ref 3.5–5.1)
Sodium: 137 mmol/L (ref 135–145)
Total Bilirubin: 0.5 mg/dL (ref 0.3–1.2)
Total Protein: 6.7 g/dL (ref 6.5–8.1)

## 2018-04-05 LAB — CBC
HEMATOCRIT: 33.7 % — AB (ref 36.0–46.0)
Hemoglobin: 10.6 g/dL — ABNORMAL LOW (ref 12.0–15.0)
MCH: 28 pg (ref 26.0–34.0)
MCHC: 31.5 g/dL (ref 30.0–36.0)
MCV: 88.9 fL (ref 78.0–100.0)
PLATELETS: 419 10*3/uL — AB (ref 150–400)
RBC: 3.79 MIL/uL — ABNORMAL LOW (ref 3.87–5.11)
RDW: 13.1 % (ref 11.5–15.5)
WBC: 15.9 10*3/uL — AB (ref 4.0–10.5)

## 2018-04-05 LAB — TYPE AND SCREEN
ABO/RH(D): AB POS
Antibody Screen: NEGATIVE

## 2018-04-05 LAB — ABO/RH: ABO/RH(D): AB POS

## 2018-04-05 MED ORDER — ACETAMINOPHEN 500 MG PO TABS
500.0000 mg | ORAL_TABLET | Freq: Once | ORAL | Status: AC
  Administered 2018-04-05: 500 mg via ORAL
  Filled 2018-04-05: qty 1

## 2018-04-05 MED ORDER — GI COCKTAIL ~~LOC~~
30.0000 mL | Freq: Once | ORAL | Status: AC
  Administered 2018-04-05: 30 mL via ORAL
  Filled 2018-04-05: qty 30

## 2018-04-05 MED ORDER — ONDANSETRON HCL 4 MG/2ML IJ SOLN
4.0000 mg | Freq: Once | INTRAMUSCULAR | Status: AC
  Administered 2018-04-05: 4 mg via INTRAVENOUS
  Filled 2018-04-05: qty 2

## 2018-04-05 MED ORDER — IOHEXOL 300 MG/ML  SOLN
100.0000 mL | Freq: Once | INTRAMUSCULAR | Status: AC | PRN
  Administered 2018-04-05: 100 mL via INTRAVENOUS

## 2018-04-05 NOTE — ED Notes (Signed)
Patient states she's had nausea for a while and reports she had an endoscopy procedure about a week ago, which is when the vomiting began. Today, she c/o black and dark-colored emesis. Endorses some upper abdominal discomfort.

## 2018-04-05 NOTE — ED Notes (Signed)
Label sent to main lab to add on lipase blood test. 

## 2018-04-05 NOTE — ED Provider Notes (Signed)
[facility] EMERGENCY DEPARTMENT Provider Note   CSN: [id number] Arrival date & time: 04/05/18  1447   History   Chief Complaint Chief Complaint  Patient presents with  . Nausea  . Hematemesis    HPI Julie Rice is a 82 y.o. female past medical history of CAD, COPD, esophageal stricture, positive hemoccult blood tests, hiatal hernia s/p application, hypertension, hyperlipidemia, internal hemorrhoids, pneumonia since February here for concerns including increased fatigue, SOB, epigastric pain, nausea, black emesis.  Patient notes that she was diagnosed with pneumonia and a right lung abscess back in February.  She has been on 3 rounds of antibiotics since then continues to take antibiotics.  She is not sure which antibiotic she is actually on.  She notes that she has been coughing and having shortness of breath since then however again it has gotten worse for her.  She notes that home oxygen was in the high 80s and this concerned her because this is not normal for her.    Additionally this morning she had a large amount of dark black watery emesis.  She has not had any other emesis since then.  She notes that she vomits daily due to troubles with her esophagus.  She is being treated for this in the outpatient setting.  Was able to eat a half full serial this morning and tolerate water and tea.  She admits to nausea, epigastric abdominal pain, intermittent constipation.  Denies any hematochezia, diarrhea, fevers, lightheadedness, dizziness.   HPI  Past Medical History:  Diagnosis Date  . Anemia   . Anxiety   . Arthritis    ra and oa  . Bipolar disorder (HCC)   . Blood transfusion 1991   autologous pts own blood given   . CAD (coronary artery disease)   . Chest pain    "@ rest, lying down, w/exertion"  . COPD (chronic obstructive pulmonary disease) (HCC)   . Depression   . Dyspnea   . Esophageal stricture   . Fecal occult blood test positive   .  Fibromyalgia   . GERD (gastroesophageal reflux disease)   . Hiatal hernia   . Hyperlipidemia   . Hypertension   . Internal hemorrhoids   . Lumbago   . Mitral regurgitation   . PONV (postoperative nausea and vomiting)    severe ponv, did ok with last surgery  . Rectal bleeding   . SOB (shortness of breath)    with exertion  . Spondylosis   . TIA (transient ischemic attack) 2013  . Urinary incontinence    wears depends    Patient Active Problem List   Diagnosis Date Noted  . Lung abscess (HCC) 01/01/2018  . Periesophageal hiatal hernia 08/02/2016  . Dysphagia 05/10/2016  . Esophageal stenosis 05/10/2016  . Abdominal pain, epigastric 05/10/2016  . Generalized anxiety disorder 02/06/2012  . Major depressive disorder, recurrent (HCC) 02/05/2012  . Hypokalemia 11/09/2011  . Nausea and vomiting in adult 11/08/2011  . Dehydration 11/08/2011  . Tremors of nervous system 11/08/2011  . Hypothyroidism 05/10/2008  . Dyslipidemia 05/10/2008  . UNSPECIFIED ANEMIA 05/10/2008  . CAROTID ARTERY DISEASE 05/10/2008  . Transient cerebral ischemia 05/10/2008  . Chronic back pain 05/10/2008  . OSTEOPENIA 05/10/2008  . ESOPHAGEAL STRICTURE 01/19/2008  . Essential hypertension 04/29/2007  . GERD 04/29/2007  . Osteoarthritis 04/29/2007  . SPONDYLOSIS 04/29/2007    Past Surgical History:  Procedure Laterality Date  . ABDOMINAL HYSTERECTOMY  1982  . BLADDER SUSPENSION  1980's  .  CATARACT EXTRACTION W/ INTRAOCULAR LENS  IMPLANT, BILATERAL Bilateral 2010  . DILATION AND CURETTAGE OF UTERUS  1961  . ESOPHAGEAL MANOMETRY N/A 03/25/2018   Procedure: ESOPHAGEAL MANOMETRY (EM);  Surgeon: Napoleon Form, MD;  Location: [facility];  Service: Endoscopy;  Laterality: N/A;  . HIATAL HERNIA REPAIR N/A 08/02/2016   Procedure: LAPAROSCOPIC REPAIR OF LARGE  HIATAL HERNIA;  Surgeon: Glenna Fellows, MD;  Location: [facility];  Service: General;  Laterality: N/A;  . LAPAROSCOPIC NISSEN FUNDOPLICATION  N/A 08/02/2016   Procedure: LAPAROSCOPIC NISSEN FUNDOPLICATION;  Surgeon: Glenna Fellows, MD;  Location: [facility] OR[facility];  Service: General;  Laterality: N/A;  . LUMBAR LAMINECTOMY  1990; 1994; 2951;[id number]   "I've got 2 stainless steel rods; 6 screws; 2 ray cages"took bone from right hip to put in back  . NISSEN FUNDOPLICATION  10/2016  . TONSILLECTOMY AND ADENOIDECTOMY  1945  . TOTAL KNEE ARTHROPLASTY Right ~ 1996  . TUBAL LIGATION  ~ 1976     OB History   None      Home Medications    Prior to Admission medications   Medication Sig Start Date End Date Taking? Authorizing Provider  amLODipine (NORVASC) 2.5 MG tablet Take 1 tablet (2.5 mg total) by mouth daily. 11/27/17  Yes Gordy Savers, MD  aspirin 81 MG tablet Take 1 tablet (81 mg total) by mouth daily. For cardiovascular health. 02/07/12  Yes Viviann Spare, FNP  azithromycin (ZITHROMAX) 250 MG tablet TAKE 2 TABLETS DAILY FOR 3 DAYS 04/03/18  Yes Gordy Savers, MD  clonazePAM (KLONOPIN) 0.5 MG tablet TAKE 1 TABLET BY MOUTH 3 TIMES A DAY AS NEEDED 03/05/18  Yes Gordy Savers, MD  gabapentin (NEURONTIN) 100 MG capsule TAKE 1 CAPSULE BY MOUTH THREE TIMES A DAY 07/08/17  Yes Gordy Savers, MD  losartan-hydrochlorothiazide [facility]) 100-25 MG tablet TAKE 1 TABLET BY MOUTH EVERY DAY 01/22/18  Yes Gordy Savers, MD  meloxicam (MOBIC) 15 MG tablet Take 15 mg by mouth daily. 03/21/18  Yes [provider]  Multiple Vitamin (MULTIVITAMIN) tablet Take 1 tablet by mouth daily. Vitamin supplement. 02/07/12  Yes Viviann Spare, FNP  ondansetron (ZOFRAN) 4 MG tablet Take one tablet every 4-6 hours as needed for nausea 03/18/18  Yes Hilarie Fredrickson, MD  pantoprazole (PROTONIX) 20 MG tablet Take 20 mg by mouth daily. 02/20/18  Yes [provider]  sertraline (ZOLOFT) 100 MG tablet TAKE 1 TABLET BY MOUTH EVERY DAY 01/13/18  Yes Gordy Savers, MD  traMADol (ULTRAM) 50 MG tablet Take 1-2 tablets (50-100 mg total)  by mouth every 4 (four) hours as needed for moderate pain. 02/09/18  Yes Nyoka Cowden, MD  pantoprazole (PROTONIX) 40 MG tablet TAKE 1 TABLET BY MOUTH 3O TO 60 MIN BEFORE YOUR FIRST AND LAST MEALS OF THE DAY Patient not taking: Reported on 04/05/2018 03/31/18   Nyoka Cowden, MD    Family History Family History  Problem Relation Age of Onset  . Kidney disease Mother   . Hypertension Mother   . Heart disease Father 82       MI  . Suicidality Son   . Colon cancer Neg Hx   . Esophageal cancer Neg Hx   . Pancreatic cancer Neg Hx     Social History Social History   Tobacco Use  . Smoking status: Never Smoker  . Smokeless tobacco: Never Used  Substance Use Topics  . Alcohol use: No  . Drug use: No  Allergies   Metaxalone; Morphine; and Tape   Review of Systems Review of Systems   Physical Exam Updated Vital Signs BP (!) 182/93   Pulse 86   Temp 98.6 F (37 C) (Oral)   Resp 17   Ht 5\' 2"  (1.575 m)   Wt 68.5 kg (151 lb)   SpO2 92%   BMI 27.62 kg/m   Physical Exam  Constitutional: She is oriented to person, place, and time. She appears well-developed and well-nourished.  HENT:  Head: Normocephalic.  Right Ear: External ear normal.  Left Ear: External ear normal.  Mouth/Throat: Oropharynx is clear and moist.  Eyes: Pupils are equal, round, and reactive to light.  Neck: Normal range of motion.  Cardiovascular: Normal rate, regular rhythm and intact distal pulses.  Murmur (systolic murmur heard) heard. Pulmonary/Chest: No respiratory distress. She has decreased breath sounds in the right middle field. She has no wheezes (faint expiratory wheezes bilaterally).  Crackles in left lower lung field  Abdominal: Soft. Bowel sounds are normal. She exhibits no distension. There is tenderness (epigastric).  Musculoskeletal: Normal range of motion. She exhibits no edema.  Neurological: She is alert and oriented to person, place, and time.  Skin: Skin is warm and dry.  Capillary refill takes less than 2 seconds.  Psychiatric: She has a normal mood and affect. Her behavior is normal. Judgment and thought content normal.     ED Treatments / Results  Labs (all labs ordered are listed, but only abnormal results are displayed) Labs Reviewed  COMPREHENSIVE METABOLIC PANEL - Abnormal; Notable for the following components:      Result Value   Potassium 3.1 (*)    Chloride 99 (*)    Calcium 8.6 (*)    Albumin 3.4 (*)    ALT 12 (*)    GFR calc non Af Amer 59 (*)    All other components within normal limits  CBC - Abnormal; Notable for the following components:   WBC 15.9 (*)    RBC 3.79 (*)    Hemoglobin 10.6 (*)    HCT 33.7 (*)    Platelets 419 (*)    All other components within normal limits  LIPASE, BLOOD  TYPE AND SCREEN  ABO/RH    EKG None  Radiology Dg Chest 2 View  Result Date: 04/05/2018 CLINICAL DATA:  82 year old female with history of nausea. Recent endoscopy procedure 1 week ago, with intermittent vomiting since that time. Black and dark colored emesis. Upper abdominal discomfort. EXAM: CHEST - 2 VIEW COMPARISON:  Chest x-ray 04/01/2018. FINDINGS: Lung volumes are normal. No consolidative airspace disease. No pleural effusions. Diffuse peribronchial cuffing and interstitial prominence, similar to the prior study, suggesting potential interstitial lung disease. No pleural effusions. Nodular density projecting over the right upper lobe which corresponds to the right upper lobe pneumonia better demonstrated on prior chest x-ray 12/22/2017 and chest CT 12/25/2017, likely to reflect an area of post infectious or inflammatory scarring. No evidence of pulmonary edema. Heart size is normal. Upper mediastinal contours are within normal limits. Aortic atherosclerosis. IMPRESSION: 1. No definite radiographic evidence of acute cardiopulmonary disease. 2. Ill-defined nodular density projecting over the right upper lobe which likely reflects post infectious  or inflammatory scarring from prior pneumonia. However, this is more apparent than prior chest x-ray 04/01/2018. The possibility of repeat infection is not entirely excluded. Followup PA and lateral chest X-ray is recommended in 3-4 weeks to ensure resolution and exclude underlying malignancy. 3. Aortic atherosclerosis. Electronically Signed  By: Trudie Reed M.D.   On: 04/05/2018 16:50   Ct Abdomen Pelvis W Contrast  Result Date: 04/05/2018 CLINICAL DATA:  82 y/o F; black and dark colored emesis. Endoscopy procedure 1 week ago. History of esophageal banding procedure 1 year ago. EXAM: CT ABDOMEN AND PELVIS WITH CONTRAST TECHNIQUE: Multidetector CT imaging of the abdomen and pelvis was performed using the standard protocol following bolus administration of intravenous contrast. CONTRAST:  OMNIPAQUE IOHEXOL 300 MG/ML  SOLN COMPARISON:  08/05/2017 CT abdomen and pelvis. 03/24/2018 esophagram. FINDINGS: Lower chest: Increased size of moderate hiatal hernia. Postsurgical changes at the hilum of the diaphragm. Few clustered nodules at the lung bases in bronchovascular distribution. Hepatobiliary: No focal liver abnormality is seen. No gallstones, gallbladder wall thickening, or biliary dilatation. Pancreas: Unremarkable. No pancreatic ductal dilatation or surrounding inflammatory changes. Spleen: Normal in size without focal abnormality. Adrenals/Urinary Tract: Adrenal glands are unremarkable. Kidneys are normal, without renal calculi, focal lesion, or hydronephrosis. Bladder is unremarkable. Stomach/Bowel: Stomach is within normal limits. Appendix appears normal. No evidence of bowel wall thickening, distention, or inflammatory changes. Mild sigmoid diverticulosis. No findings of acute diverticulitis. There is dense contrast within the distal colon. Vascular/Lymphatic: Aortic atherosclerosis. No enlarged abdominal or pelvic lymph nodes. Reproductive: Status post hysterectomy. No adnexal masses. Other: No  abdominal wall hernia or abnormality. No abdominopelvic ascites. Musculoskeletal: No fracture is seen. L2-S1 posterior instrumented fusion and lateral arthrodesis. L4-5 laminectomy. Hardware appears intact and there is no apparent hardware related complication. No acute fracture identified. Moderate dextrocurvature of the thoracolumbar junction. Straightening of lumbar lordosis without listhesis. IMPRESSION: 1. Increased size of moderate hiatal hernia from 08/05/2017 partially visualized and extending above the field of view. 2. Aortic atherosclerosis. 3. Scattered sigmoid diverticulosis, no findings of acute diverticulitis. 4. Few clustered nodules in the lung bases in bronchovascular distribution, possibly aspiration pneumonitis given history of vomiting. Electronically Signed   By: Mitzi Hansen M.D.   On: 04/05/2018 19:07    Procedures Procedures (including critical care time)  Medications Ordered in ED Medications  gi cocktail (Maalox,Lidocaine,Donnatal) (30 mLs Oral Given 04/05/18 1859)  acetaminophen (TYLENOL) tablet 500 mg (500 mg Oral Given 04/05/18 1859)  iohexol (OMNIPAQUE) 300 MG/ML solution 100 mL (100 mLs Intravenous Contrast Given 04/05/18 1833)  ondansetron (ZOFRAN) injection 4 mg (4 mg Intravenous Given 04/05/18 2147)     Initial Impression / Assessment and Plan / ED Course  I have reviewed the triage vital signs and the nursing notes.  Pertinent labs & imaging results that were available during my care of the patient were reviewed by me and considered in my medical decision making (see chart for details).   82 year old female with PMH CAD, COPD, esophageal stricture, positive Hemoccult blood tests, hiatal hernia s/p application, hypertension, hyperlipidemia, internal hemorrhoids, here for several complaints including increased fatigue, SOB, nausea, epigastric pain, black emesis.  BP elevated to 195/117 initially but trended down to 150/91.  Heart rate is normal and patient  is hemodynamically stable.    Chest x-ray from 5/29 showing any pneumonia or abscess, per Epic review she is on azithromycin.  Pulmonology is following the patient for possible recurrent aspiration pneumonitis.  Chest x-ray here not showing any signs of pneumonia, sepsis, or fluid overload. CBC showing hemoglobin of 10.6, was 11.13 days ago.  Persistent leukocytosis of 15.9 compared to 18.3 3 days ago.  Patient continues to have epigastric abdominal pain, requested "nothing strong", GI cocktail, Tylenol, and Zofran given.  Pain and nausea persistent.  CT  abdomen showing increased size of hiatal hernia as compared to October 2018, diverticulosis but no diverticulitis, clustered nodules at the base of the lungs which may represent aspiration pneumonitis.   At this time patient is hemodynamically stable with no signs of respiratory distress. She continues to have nausea and epigastric pain, had episode of emesis when given water. Another dose of Zofran ordered. Given her PMH and inability to tolerate p.o. and likely hematemesis early today, formally consulted GI agreed to see patient. Discussed patient with family medicine resident Dr. Artist Pais who will admit patient.  Final Clinical Impressions(s) / ED Diagnoses   Final diagnoses:  Hematemesis with nausea  Epigastric pain  Pneumonitis    ED Discharge Orders    None       Beaulah Dinning, MD 04/05/18 2213    Blane Ohara, MD 04/06/18 [phone]

## 2018-04-05 NOTE — ED Triage Notes (Signed)
PT  From home with reported vomiting dark blood today. PNA since Jan. 2019. Pt on multi. Antibx.  B 178/92, CBG 113, HR 88

## 2018-04-05 NOTE — ED Notes (Signed)
Patient transported to CT 

## 2018-04-05 NOTE — H&P (Signed)
Family Medicine Teaching [facility] Admission History and Physical Service Pager: [phone]  Patient name: Julie Rice Medical record number: [id number] Date of birth: [birth year] Age: 82 y.o. Gender: female  Primary Care Provider: Gordy Savers, MD Consultants: GI Code Status: DNR/DNI  Chief Complaint: vomiting dark blood  Assessment and Plan: Julie Rice is a 82 y.o. female presenting with nausea and hematemesis. PMH is significant for COPD, esophageal stricture, positive hemoccult blood tests, hiatal hernia s/p application, HTN, HLD, internal hemorrhoids, recent pneumonia with abscess in Feb 2019   Leukocytosis with shortness of breath. Patient has been vomiting and persistently elevated WBC since Feb 13.8>11.4>acutely elevated to 18.3 on 04/01/18 and on admit 15.9.  History is concerning for chronic aspiration pneumonitis/pnemonia. CXR neg for infiltrate and patient afebrile and O2 sat in mid 90s on RA but CT abd showing clustered nodules in lung bases. Since patient also endorsing worsening SOB on ambulation over the last few days will cover for aspiration PNA with IV ABX as patient is not tolerating po. - place in observation, attending Dr. Lum Babe - monitor resp status - s/p azithromycin as outpt 5/31-6/1 - IV unasyn (6/3-) - prn duonebs  Hematemesis. In the setting of recent endoscopy for esophageal stricture on 03/25/18. Patient has long standing history of esophageal strictures and hiatal hernia with multiple surgeries. CT abd showing increased size of mod hiatal hernia that likely could be cause of abdominal pain. Hgb stable. - GI consulted by ED  - clear liquid diet - monitor Hgb - prn zofran - protonix 40mg  qd  HTN. BP on admit  - continue home norvasc 2.5mg  qd, losartan-HCTZ 100-25mg  qd.  GAD, MDD. - continue home klonopin 0.25mg  BID, zoloft 100mg  qd  Chronic pain, osteoarthritis - continue home tramadol and gabapentin   FEN/GI: clear liquid  diet Prophylaxis: SCD  Disposition: place in observation  History of Present Illness:  Julie Rice is a 82 y.o. female presenting with nausea and hematemesis.  Patient states that she has had chronic cough since February 2019 when she had developed pneumonia. She states this cough has not acutely worsened but rather persisted but she has noticed some difficulty with SOB on ambulation over the last few days when she walks to her mailbox. She states that she has been following with her PCP who started her on ABX, took the Friday, Saturday and today without any improvement. She has also been vomiting over this same time frame of the past few months. Today at home she had 2 episodes of black vomit that she states look exactly like when she has vomited blood in the past. No lightheadedness or dizziness. Now she is nauseous and unable to keep down even water but has had no further episodes of hematemesis in the ED.  Per chart review, patient has long standing dysphagia and had gagging/vomiting in Jan 2019 with subsequently found to have PNA with cavitary changes on CT in Feb 2019. She has undergone multiple rounds of antibiotics including augmentin x10 d in Feb and clindamycin x10d in March with resolution of PNA per pulm. She was most recently started on 04/03/18 azithromycin x3 day last week from PCP given her persistent cough and acutely elevated WBC.   Review Of Systems: Per HPI with the following additions:   Review of Systems  Constitutional: Negative for chills, diaphoresis and fever.  Respiratory: Positive for cough, sputum production (whitish), shortness of breath and wheezing (some, chronic). Negative for hemoptysis.   Cardiovascular: Negative for chest  pain and palpitations.  Gastrointestinal: Positive for abdominal pain, heartburn, nausea and vomiting. Negative for blood in stool, constipation, diarrhea and melena.  Genitourinary: Negative for dysuria, frequency and urgency.   Neurological: Negative for dizziness.    Patient Active Problem List   Diagnosis Date Noted  . Lung abscess (HCC) 01/01/2018  . Periesophageal hiatal hernia 08/02/2016  . Dysphagia 05/10/2016  . Esophageal stenosis 05/10/2016  . Abdominal pain, epigastric 05/10/2016  . Generalized anxiety disorder 02/06/2012  . Major depressive disorder, recurrent (HCC) 02/05/2012  . Hypokalemia 11/09/2011  . Nausea and vomiting in adult 11/08/2011  . Dehydration 11/08/2011  . Tremors of nervous system 11/08/2011  . Hypothyroidism 05/10/2008  . Dyslipidemia 05/10/2008  . UNSPECIFIED ANEMIA 05/10/2008  . CAROTID ARTERY DISEASE 05/10/2008  . Transient cerebral ischemia 05/10/2008  . Chronic back pain 05/10/2008  . OSTEOPENIA 05/10/2008  . ESOPHAGEAL STRICTURE 01/19/2008  . Essential hypertension 04/29/2007  . GERD 04/29/2007  . Osteoarthritis 04/29/2007  . SPONDYLOSIS 04/29/2007    Past Medical History: Past Medical History:  Diagnosis Date  . Anemia   . Anxiety   . Arthritis    ra and oa  . Bipolar disorder (HCC)   . Blood transfusion 1991   autologous pts own blood given   . CAD (coronary artery disease)   . Chest pain    "@ rest, lying down, w/exertion"  . COPD (chronic obstructive pulmonary disease) (HCC)   . Depression   . Dyspnea   . Esophageal stricture   . Fecal occult blood test positive   . Fibromyalgia   . GERD (gastroesophageal reflux disease)   . Hiatal hernia   . Hyperlipidemia   . Hypertension   . Internal hemorrhoids   . Lumbago   . Mitral regurgitation   . PONV (postoperative nausea and vomiting)    severe ponv, did ok with last surgery  . Rectal bleeding   . SOB (shortness of breath)    with exertion  . Spondylosis   . TIA (transient ischemic attack) 2013  . Urinary incontinence    wears depends    Past Surgical History: Past Surgical History:  Procedure Laterality Date  . ABDOMINAL HYSTERECTOMY  1982  . BLADDER SUSPENSION  1980's  . CATARACT  EXTRACTION W/ INTRAOCULAR LENS  IMPLANT, BILATERAL Bilateral 2010  . DILATION AND CURETTAGE OF UTERUS  1961  . ESOPHAGEAL MANOMETRY N/A 03/25/2018   Procedure: ESOPHAGEAL MANOMETRY (EM);  Surgeon: Napoleon Form, MD;  Location: [facility];  Service: Endoscopy;  Laterality: N/A;  . HIATAL HERNIA REPAIR N/A 08/02/2016   Procedure: LAPAROSCOPIC REPAIR OF LARGE  HIATAL HERNIA;  Surgeon: Glenna Fellows, MD;  Location: [facility];  Service: General;  Laterality: N/A;  . LAPAROSCOPIC NISSEN FUNDOPLICATION N/A 08/02/2016   Procedure: LAPAROSCOPIC NISSEN FUNDOPLICATION;  Surgeon: Glenna Fellows, MD;  Location: [facility];  Service: General;  Laterality: N/A;  . LUMBAR LAMINECTOMY  1990; 1994; [id number]   "I've got 2 stainless steel rods; 6 screws; 2 ray cages"took bone from right hip to put in back  . NISSEN FUNDOPLICATION  10/2016  . TONSILLECTOMY AND ADENOIDECTOMY  1945  . TOTAL KNEE ARTHROPLASTY Right ~ 1996  . TUBAL LIGATION  ~ 1976    Social History: Social History   Tobacco Use  . Smoking status: Never Smoker  . Smokeless tobacco: Never Used  Substance Use Topics  . Alcohol use: No  . Drug use: No   Additional social history: Lives at home alone.  Please also refer to  relevant sections of EMR.  Family History: Family History  Problem Relation Age of Onset  . Kidney disease Mother   . Hypertension Mother   . Heart disease Father 94       MI  . Suicidality Son   . Colon cancer Neg Hx   . Esophageal cancer Neg Hx   . Pancreatic cancer Neg Hx     Allergies and Medications: Allergies  Allergen Reactions  . Metaxalone Other (See Comments)    Unknown  . Morphine Other (See Comments)    Flushing, rash, itching  . Tape Other (See Comments)    Band aides, adhesive tape Redness and pulls skin off   No current facility-administered medications on file prior to encounter.    Current Outpatient Medications on File Prior to Encounter  Medication Sig Dispense Refill  .  amLODipine (NORVASC) 2.5 MG tablet Take 1 tablet (2.5 mg total) by mouth daily. 90 tablet 3  . aspirin 81 MG tablet Take 1 tablet (81 mg total) by mouth daily. For cardiovascular health.    Marland Kitchen azithromycin (ZITHROMAX) 250 MG tablet TAKE 2 TABLETS DAILY FOR 3 DAYS 6 tablet 0  . clonazePAM (KLONOPIN) 0.5 MG tablet TAKE 1 TABLET BY MOUTH 3 TIMES A DAY AS NEEDED 270 tablet 0  . gabapentin (NEURONTIN) 100 MG capsule TAKE 1 CAPSULE BY MOUTH THREE TIMES A DAY 270 capsule 0  . losartan-hydrochlorothiazide (HYZAAR) 100-25 MG tablet TAKE 1 TABLET BY MOUTH EVERY DAY 90 tablet 2  . meloxicam (MOBIC) 15 MG tablet Take 15 mg by mouth daily.  0  . Multiple Vitamin (MULTIVITAMIN) tablet Take 1 tablet by mouth daily. Vitamin supplement.    . ondansetron (ZOFRAN) 4 MG tablet Take one tablet every 4-6 hours as needed for nausea 60 tablet 3  . pantoprazole (PROTONIX) 20 MG tablet Take 20 mg by mouth daily.  2  . sertraline (ZOLOFT) 100 MG tablet TAKE 1 TABLET BY MOUTH EVERY DAY 90 tablet 0  . traMADol (ULTRAM) 50 MG tablet Take 1-2 tablets (50-100 mg total) by mouth every 4 (four) hours as needed for moderate pain. 40 tablet 0  . pantoprazole (PROTONIX) 40 MG tablet TAKE 1 TABLET BY MOUTH 3O TO 60 MIN BEFORE YOUR FIRST AND LAST MEALS OF THE DAY (Patient not taking: Reported on 04/05/2018) 60 tablet 2    Objective: BP (!) 163/76   Pulse 84   Temp 98.6 F (37 C) (Oral)   Resp (!) 21   Ht 5\' 2"  (1.575 m)   Wt 68.5 kg (151 lb)   SpO2 91%   BMI 27.62 kg/m  Exam: General: sitting up in bed, in NAD Eyes: PERRL, EOMI ENTM: MMM, oropharynx nonerythematous Neck: supple, normal ROM Cardiovascular: regular, normal s1 and s2, grade II systolic murmur Respiratory: crackles in lung bases with normal effort on room air. No wheezes or rhonchi. Gastrointestinal: TTP of midepigastric area, soft, no rebound, + bowel sounds. MSK: moving all limbs equally Derm: warm and dry  Neuro: awake and alert, grossly normal Psych:  appropriate affect   Labs and Imaging: CBC BMET  Recent Labs  Lab 04/05/18 1550  WBC 15.9*  HGB 10.6*  HCT 33.7*  PLT 419*   Recent Labs  Lab 04/05/18 1550  NA 137  K 3.1*  CL 99*  CO2 28  BUN 12  CREATININE 0.90  GLUCOSE 97  CALCIUM 8.6*      Dg Chest 2 View  Result Date: 04/05/2018 CLINICAL DATA:  82 year old female with  history of nausea. Recent endoscopy procedure 1 week ago, with intermittent vomiting since that time. Black and dark colored emesis. Upper abdominal discomfort. EXAM: CHEST - 2 VIEW COMPARISON:  Chest x-ray 04/01/2018. FINDINGS: Lung volumes are normal. No consolidative airspace disease. No pleural effusions. Diffuse peribronchial cuffing and interstitial prominence, similar to the prior study, suggesting potential interstitial lung disease. No pleural effusions. Nodular density projecting over the right upper lobe which corresponds to the right upper lobe pneumonia better demonstrated on prior chest x-ray 12/22/2017 and chest CT 12/25/2017, likely to reflect an area of post infectious or inflammatory scarring. No evidence of pulmonary edema. Heart size is normal. Upper mediastinal contours are within normal limits. Aortic atherosclerosis. IMPRESSION: 1. No definite radiographic evidence of acute cardiopulmonary disease. 2. Ill-defined nodular density projecting over the right upper lobe which likely reflects post infectious or inflammatory scarring from prior pneumonia. However, this is more apparent than prior chest x-ray 04/01/2018. The possibility of repeat infection is not entirely excluded. Followup PA and lateral chest X-ray is recommended in 3-4 weeks to ensure resolution and exclude underlying malignancy. 3. Aortic atherosclerosis. Electronically Signed   By: Trudie Reed M.D.   On: 04/05/2018 16:50   Ct Abdomen Pelvis W Contrast  Result Date: 04/05/2018 CLINICAL DATA:  82 y/o F; black and dark colored emesis. Endoscopy procedure 1 week ago. History of  esophageal banding procedure 1 year ago. EXAM: CT ABDOMEN AND PELVIS WITH CONTRAST TECHNIQUE: Multidetector CT imaging of the abdomen and pelvis was performed using the standard protocol following bolus administration of intravenous contrast. CONTRAST:  OMNIPAQUE IOHEXOL 300 MG/ML  SOLN COMPARISON:  08/05/2017 CT abdomen and pelvis. 03/24/2018 esophagram. FINDINGS: Lower chest: Increased size of moderate hiatal hernia. Postsurgical changes at the hilum of the diaphragm. Few clustered nodules at the lung bases in bronchovascular distribution. Hepatobiliary: No focal liver abnormality is seen. No gallstones, gallbladder wall thickening, or biliary dilatation. Pancreas: Unremarkable. No pancreatic ductal dilatation or surrounding inflammatory changes. Spleen: Normal in size without focal abnormality. Adrenals/Urinary Tract: Adrenal glands are unremarkable. Kidneys are normal, without renal calculi, focal lesion, or hydronephrosis. Bladder is unremarkable. Stomach/Bowel: Stomach is within normal limits. Appendix appears normal. No evidence of bowel wall thickening, distention, or inflammatory changes. Mild sigmoid diverticulosis. No findings of acute diverticulitis. There is dense contrast within the distal colon. Vascular/Lymphatic: Aortic atherosclerosis. No enlarged abdominal or pelvic lymph nodes. Reproductive: Status post hysterectomy. No adnexal masses. Other: No abdominal wall hernia or abnormality. No abdominopelvic ascites. Musculoskeletal: No fracture is seen. L2-S1 posterior instrumented fusion and lateral arthrodesis. L4-5 laminectomy. Hardware appears intact and there is no apparent hardware related complication. No acute fracture identified. Moderate dextrocurvature of the thoracolumbar junction. Straightening of lumbar lordosis without listhesis. IMPRESSION: 1. Increased size of moderate hiatal hernia from 08/05/2017 partially visualized and extending above the field of view. 2. Aortic  atherosclerosis. 3. Scattered sigmoid diverticulosis, no findings of acute diverticulitis. 4. Few clustered nodules in the lung bases in bronchovascular distribution, possibly aspiration pneumonitis given history of vomiting. Electronically Signed   By: Mitzi Hansen M.D.   On: 04/05/2018 19:07    Leland Her, DO 04/05/2018, 10:06 PM PGY-2, [facility] FPTS Intern pager: [phone], text pages welcome

## 2018-04-05 NOTE — ED Notes (Signed)
Pt given water, after one sip pt began to vomit again.

## 2018-04-06 ENCOUNTER — Other Ambulatory Visit: Payer: Self-pay

## 2018-04-06 ENCOUNTER — Encounter (HOSPITAL_COMMUNITY): Payer: Self-pay | Admitting: Physician Assistant

## 2018-04-06 ENCOUNTER — Ambulatory Visit: Payer: Medicare Other | Admitting: Internal Medicine

## 2018-04-06 DIAGNOSIS — K224 Dyskinesia of esophagus: Secondary | ICD-10-CM | POA: Diagnosis not present

## 2018-04-06 DIAGNOSIS — D649 Anemia, unspecified: Secondary | ICD-10-CM | POA: Diagnosis not present

## 2018-04-06 DIAGNOSIS — J690 Pneumonitis due to inhalation of food and vomit: Secondary | ICD-10-CM | POA: Diagnosis not present

## 2018-04-06 DIAGNOSIS — R1013 Epigastric pain: Secondary | ICD-10-CM | POA: Diagnosis not present

## 2018-04-06 DIAGNOSIS — Q399 Congenital malformation of esophagus, unspecified: Secondary | ICD-10-CM | POA: Diagnosis not present

## 2018-04-06 DIAGNOSIS — K920 Hematemesis: Secondary | ICD-10-CM | POA: Diagnosis present

## 2018-04-06 DIAGNOSIS — R1310 Dysphagia, unspecified: Secondary | ICD-10-CM | POA: Diagnosis not present

## 2018-04-06 LAB — CBC
HCT: 31.6 % — ABNORMAL LOW (ref 36.0–46.0)
Hemoglobin: 9.9 g/dL — ABNORMAL LOW (ref 12.0–15.0)
MCH: 28 pg (ref 26.0–34.0)
MCHC: 31.3 g/dL (ref 30.0–36.0)
MCV: 89.3 fL (ref 78.0–100.0)
PLATELETS: 355 10*3/uL (ref 150–400)
RBC: 3.54 MIL/uL — ABNORMAL LOW (ref 3.87–5.11)
RDW: 13.1 % (ref 11.5–15.5)
WBC: 13.4 10*3/uL — ABNORMAL HIGH (ref 4.0–10.5)

## 2018-04-06 LAB — BASIC METABOLIC PANEL
Anion gap: 11 (ref 5–15)
BUN: 10 mg/dL (ref 6–20)
CHLORIDE: 102 mmol/L (ref 101–111)
CO2: 29 mmol/L (ref 22–32)
CREATININE: 0.84 mg/dL (ref 0.44–1.00)
Calcium: 8.8 mg/dL — ABNORMAL LOW (ref 8.9–10.3)
GFR calc non Af Amer: >60 mL/min (ref >60)
Glucose, Bld: 83 mg/dL (ref 65–99)
Potassium: 2.9 mmol/L — ABNORMAL LOW (ref 3.5–5.1)
SODIUM: 142 mmol/L (ref 135–145)

## 2018-04-06 MED ORDER — HYDROCHLOROTHIAZIDE 25 MG PO TABS
25.0000 mg | ORAL_TABLET | Freq: Every day | ORAL | Status: DC
  Administered 2018-04-06 – 2018-04-09 (×4): 25 mg via ORAL
  Filled 2018-04-06 (×4): qty 1

## 2018-04-06 MED ORDER — ALUM & MAG HYDROXIDE-SIMETH 200-200-20 MG/5ML PO SUSP
15.0000 mL | Freq: Once | ORAL | Status: AC
Start: 2018-04-06 — End: 2018-04-06
  Administered 2018-04-06: 15 mL via ORAL
  Filled 2018-04-06: qty 30

## 2018-04-06 MED ORDER — CLONAZEPAM 0.25 MG PO TBDP
0.2500 mg | ORAL_TABLET | Freq: Two times a day (BID) | ORAL | Status: DC
  Administered 2018-04-06 – 2018-04-09 (×8): 0.25 mg via ORAL
  Filled 2018-04-06 (×8): qty 1

## 2018-04-06 MED ORDER — ONDANSETRON HCL 4 MG PO TABS: 4.0000 mg | ORAL_TABLET | Freq: Four times a day (QID) | ORAL | Status: DC | PRN

## 2018-04-06 MED ORDER — PANTOPRAZOLE SODIUM 40 MG PO TBEC
40.0000 mg | DELAYED_RELEASE_TABLET | Freq: Every day | ORAL | Status: DC
  Administered 2018-04-06 – 2018-04-09 (×4): 40 mg via ORAL
  Filled 2018-04-06 (×4): qty 1

## 2018-04-06 MED ORDER — LABETALOL HCL 5 MG/ML IV SOLN
10.0000 mg | Freq: Once | INTRAVENOUS | Status: AC
  Administered 2018-04-07: 10 mg via INTRAVENOUS
  Filled 2018-04-06: qty 4

## 2018-04-06 MED ORDER — ONDANSETRON HCL 4 MG/2ML IJ SOLN
4.0000 mg | Freq: Four times a day (QID) | INTRAMUSCULAR | Status: DC | PRN
  Administered 2018-04-06 – 2018-04-08 (×4): 4 mg via INTRAVENOUS
  Filled 2018-04-06 (×4): qty 2

## 2018-04-06 MED ORDER — IPRATROPIUM-ALBUTEROL 0.5-2.5 (3) MG/3ML IN SOLN: 3.0000 mL | Freq: Four times a day (QID) | RESPIRATORY_TRACT | Status: DC | PRN

## 2018-04-06 MED ORDER — LOSARTAN POTASSIUM 50 MG PO TABS
100.0000 mg | ORAL_TABLET | Freq: Every day | ORAL | Status: DC
  Administered 2018-04-06 – 2018-04-09 (×4): 100 mg via ORAL
  Filled 2018-04-06 (×4): qty 2

## 2018-04-06 MED ORDER — LOSARTAN POTASSIUM-HCTZ 100-25 MG PO TABS: 1.0000 | ORAL_TABLET | Freq: Every day | ORAL | Status: DC

## 2018-04-06 MED ORDER — SERTRALINE HCL 100 MG PO TABS
100.0000 mg | ORAL_TABLET | Freq: Every day | ORAL | Status: DC
  Administered 2018-04-06 – 2018-04-08 (×4): 100 mg via ORAL
  Filled 2018-04-06 (×4): qty 1

## 2018-04-06 MED ORDER — GABAPENTIN 100 MG PO CAPS
100.0000 mg | ORAL_CAPSULE | Freq: Three times a day (TID) | ORAL | Status: DC
  Administered 2018-04-06 – 2018-04-09 (×10): 100 mg via ORAL
  Filled 2018-04-06 (×10): qty 1

## 2018-04-06 MED ORDER — SODIUM CHLORIDE 0.9 % IV SOLN
1.5000 g | Freq: Three times a day (TID) | INTRAVENOUS | Status: AC
  Administered 2018-04-06 – 2018-04-07 (×6): 1.5 g via INTRAVENOUS
  Filled 2018-04-06 (×6): qty 1.5

## 2018-04-06 MED ORDER — AMLODIPINE BESYLATE 2.5 MG PO TABS
2.5000 mg | ORAL_TABLET | Freq: Every day | ORAL | Status: DC
  Administered 2018-04-06 – 2018-04-07 (×2): 2.5 mg via ORAL
  Filled 2018-04-06 (×2): qty 1

## 2018-04-06 MED ORDER — POTASSIUM CHLORIDE CRYS ER 20 MEQ PO TBCR
40.0000 meq | EXTENDED_RELEASE_TABLET | Freq: Two times a day (BID) | ORAL | Status: DC
  Administered 2018-04-06 (×2): 40 meq via ORAL
  Filled 2018-04-06 (×2): qty 2

## 2018-04-06 MED ORDER — TRAMADOL HCL 50 MG PO TABS
50.0000 mg | ORAL_TABLET | ORAL | Status: DC | PRN
  Administered 2018-04-06 – 2018-04-08 (×2): 50 mg via ORAL
  Filled 2018-04-06 (×2): qty 1

## 2018-04-06 NOTE — Evaluation (Signed)
Physical Therapy Evaluation Patient Details Name: SADEEN WIEGEL MRN: [id number] DOB: [birth year] Today's Date: 04/06/2018   History of Present Illness  Ginamarie C Bohman is a 82 y.o. female presenting with nausea and hematemesis. PMH is significant for COPD, esophageal stricture, positive hemoccult blood tests, hiatal hernia s/p application, HTN, HLD, internal hemorrhoids, recent pneumonia with abscess in Feb 2019   Clinical Impression  Pt admitted with above diagnosis. Pt currently with functional limitations due to the deficits listed below (see PT Problem List). Pt continues with nausea which is limiting her activity. Pt mildly unsteady in standing, RW used for ambulation. Pt ambulated 250' with RW and decreased pace and slowed reactions with changing directions.  Pt will benefit from skilled PT to increase their independence and safety with mobility to allow discharge to the venue listed below.       Follow Up Recommendations No PT follow up    Equipment Recommendations  None recommended by PT    Recommendations for Other Services       Precautions / Restrictions Precautions Precautions: Fall Precaution Comments: pt slightly unsteady on her feet Restrictions Weight Bearing Restrictions: No      Mobility  Bed Mobility Overal bed mobility: Needs Assistance Bed Mobility: Sit to Supine       Sit to supine: Supervision   General bed mobility comments: pt able to scoot back into bed and lie down. Increased effort to position self in bed  Transfers Overall transfer level: Needs assistance Equipment used: Rolling walker (2 wheeled) Transfers: Sit to/from Stand Sit to Stand: Supervision         General transfer comment: vc's for hand placement. Pt reports that she has been in the bed for 3 days and feels a bit unsteady  Ambulation/Gait Ambulation/Gait assistance: Min guard Ambulation Distance (Feet): 250 Feet Assistive device: Rolling walker (2 wheeled) Gait  Pattern/deviations: Step-through pattern;Trunk flexed Gait velocity: decreased Gait velocity interpretation: <1.8 ft/sec, indicate of risk for recurrent falls General Gait Details: slow speed with slow movement around obstacles and slow changes in direction. No LOB with use of RW. Felt nauseous with mobility  Stairs            Wheelchair Mobility    Modified Rankin (Stroke Patients Only)       Balance Overall balance assessment: Mild deficits observed, not formally tested                                           Pertinent Vitals/Pain Pain Assessment: No/denies pain    Home Living Family/patient expects to be discharged to:: Private residence Living Arrangements: Alone Available Help at Discharge: Family;Friend(s);Available PRN/intermittently Type of Home: House Home Access: Stairs to enter   Entrance Stairs-Number of Steps: 3   Home Equipment: Walker - 2 wheels;Shower seat;Grab bars - tub/shower Additional Comments: pt has RW and cane but has not been using either    Prior Function Level of Independence: Independent         Comments: has a neighbor that checks on her regularly. Sister in law lives in [place] but is retired and can come stay with her if needed. Granddaughter lives in [place] and is supportive as well     Higher education careers adviser        Extremity/Trunk Assessment   Upper Extremity Assessment Upper Extremity Assessment: Overall WFL for tasks assessed    Lower Extremity Assessment  Lower Extremity Assessment: Generalized weakness    Cervical / Trunk Assessment Cervical / Trunk Assessment: Kyphotic  Communication   Communication: No difficulties  Cognition Arousal/Alertness: Awake/alert Behavior During Therapy: WFL for tasks assessed/performed Overall Cognitive Status: Within Functional Limits for tasks assessed                                        General Comments General comments (skin integrity,  edema, etc.): Pt reports that she has been getting weaker since January when she first had pneumonia.     Exercises General Exercises - Lower Extremity Ankle Circles/Pumps: AROM;Both;10 reps;Supine   Assessment/Plan    PT Assessment Patient needs continued PT services  PT Problem List Decreased strength;Decreased activity tolerance;Decreased balance;Decreased knowledge of use of DME;Other (comment)(nausea)       PT Treatment Interventions DME instruction;Gait training;Stair training;Functional mobility training;Therapeutic activities;Therapeutic exercise;Balance training;Patient/family education    PT Goals (Current goals can be found in the Care Plan section)  Acute Rehab PT Goals Patient Stated Goal: to figure out what is going on, ie why she is nauseous PT Goal Formulation: With patient Time For Goal Achievement: 04/20/18 Potential to Achieve Goals: Good    Frequency Min 3X/week   Barriers to discharge Decreased caregiver support will be alone with supportive family and neighbors to check on her    Co-evaluation               AM-PAC PT "6 Clicks" Daily Activity  Outcome Measure Difficulty turning over in bed (including adjusting bedclothes, sheets and blankets)?: None Difficulty moving from lying on back to sitting on the side of the bed? : A Little Difficulty sitting down on and standing up from a chair with arms (e.g., wheelchair, bedside commode, etc,.)?: A Little Help needed moving to and from a bed to chair (including a wheelchair)?: A Little Help needed walking in hospital room?: A Little Help needed climbing 3-5 steps with a railing? : A Little 6 Click Score: 19    End of Session Equipment Utilized During Treatment: Gait belt Activity Tolerance: Patient tolerated treatment well Patient left: in bed;with call bell/phone within reach Nurse Communication: Mobility status PT Visit Diagnosis: Unsteadiness on feet (R26.81);Muscle weakness (generalized)  (M62.81)(nausea)    Time: 1242-1300 PT Time Calculation (min) (ACUTE ONLY): 18 min   Charges:   PT Evaluation $PT Eval Moderate Complexity: 1 Mod     PT G Codes:        Lyanne Co, PT  Acute Rehab Services  [phone]   [place] [facility] Aziyah Provencal 04/06/2018, 2:15 PM

## 2018-04-06 NOTE — Consult Note (Addendum)
[facility] Consult: 11:52 AM 04/06/2018  LOS: 0 days    Referring Provider: Dr Artist Pais, resident  Primary Care Physician:  Gordy Savers, MD Primary Gastroenterologist:  Dr [name]   Reason for Consultation:  Nausea, hematemesis.     HPI: Julie Rice is a 82 y.o. female.  PMH  IDA.  Spinal DDD/DJD, s/p lumbar spine surgery.  Lung abscesses, RUL necrotizing PNA treated 12/2017.   GERD.  Esophageal strictures.  HH.   07/2008 EGD.  Esophageal stricture dilated.  Gastritis.   02/2016 EGD.   5 cm HH.  Dilated distal esophageal stricture.   07/2016 Nissen fundoplication. 06/2016 Ultrasound.  Normal.     09/2016 Esophagram. Normal looking Nissen wrap.  Significant esophageal dysmotility with spasm, stasis, tertiary contractions.  Pill passed.       02/2017 EGD for dysphagia.  Esophageal dilation of distal esophageal stenosis.    08/2017 Colonoscopy for chronic RUQ pain.  Previous unremarkable colonoscopies 2003, 2008.  To TI. Internal hemorrhoids, O/w normal exam.  Pt with ongoing liquid, solid, dysphagia. + nausea.  No improvement after 02/2017 dilation.  Compliant with PPI.   03/24/18 esophagram.  Moderate hiatal hernia with mild GE narrowing that is likely from persistent Nissen wrap. A 13 mm barium tablet easily reached the stomach.  No specific dysmotility or stasis seen on this exam. 03/25/18 esophageal manometry.  Findings sugg off aperistalsis with 100% failed swallows, but unable to determine if probe traversed LES.  May have EGJ outflow obstruction from Nissen fundoplication in the setting of esophageal aperistalsis.  DDx includes achalasia and scleroderma esophagus.  Dr Lavon Paganini suggested repeating study with endoscopic placement of catheter.    Generally the patient has trouble with both liquids and solid  intake.  Sometimes it will stay down other times it reaches a certain point and "turns around and comes back up".  She is lost about 16 pounds in the last 3 or 4 months.  She is developed a predominantly nonproductive cough. Treated for PNA with Z pack late last week.  CXR for cough, elevated WBCs showed emphysema, interstitial prominence.  Yesterday morning she ate cereal.  Around noon she became acutely nauseous and vomited a large amount of black but clear liquid.  It was not obviously bloody.  She did not and has not had a bowel movement, her last bowel movement was 6/1 and was formed, brown. Presented yest afternoon to ED after vomiting dark material.  Since the episode of dark emesis she has not had any repeat episodes.  She has vomited some partially digested crackers and liquids.  In general she has been feeling weak, tired and emotionally drained by her problems swallowing. Hgb 10.6 >> 9.9.    Avg ~ 11.  11.1 on 04/01/18.  MCV 89. K 3.1.  Renal fx normal.  LFTs and Lipase normal.   CT ab/pelvis with contrast: size of HH increased.  Aortic atherosclerosis.  Scattered sigmoid tics.  Bronchovascular distribution of nodules in lung bases, ? Aspiration pneumonitis in setting of vomiting?  Patient takes low-dose aspirin  daily but no other aspirin or NSAID products. No alcohol to speak of.  Patient is widowed, lives alone and does all of her own cooking/cleaning.  She is highly functional.  Past Medical History:  Diagnosis Date  . Anemia   . Anxiety   . Arthritis    ra and oa  . Bipolar disorder (HCC)   . Blood transfusion 1991   autologous pts own blood given   . CAD (coronary artery disease)   . Chest pain    "@ rest, lying down, w/exertion"  . COPD (chronic obstructive pulmonary disease) (HCC)   . Depression   . Esophageal stricture   . Fecal occult blood test positive   . Fibromyalgia   . GERD (gastroesophageal reflux disease)   . Hiatal hernia   . Hyperlipidemia   . Hypertension    . Internal hemorrhoids   . Lumbago   . Mitral regurgitation   . PONV (postoperative nausea and vomiting)    severe ponv, did ok with last surgery  . Rectal bleeding   . Spondylosis   . TIA (transient ischemic attack) 2013  . Urinary incontinence    wears depends    Past Surgical History:  Procedure Laterality Date  . ABDOMINAL HYSTERECTOMY  1982  . BLADDER SUSPENSION  1980's  . CATARACT EXTRACTION W/ INTRAOCULAR LENS  IMPLANT, BILATERAL Bilateral 2010  . DILATION AND CURETTAGE OF UTERUS  1961  . ESOPHAGEAL MANOMETRY N/A 03/25/2018   Procedure: ESOPHAGEAL MANOMETRY (EM);  Surgeon: Napoleon Form, MD;  Location: [facility];  Service: Endoscopy;  Laterality: N/A;  . HIATAL HERNIA REPAIR N/A 08/02/2016   Procedure: LAPAROSCOPIC REPAIR OF LARGE  HIATAL HERNIA;  Surgeon: Glenna Fellows, MD;  Location: [facility];  Service: General;  Laterality: N/A;  . LAPAROSCOPIC NISSEN FUNDOPLICATION N/A 08/02/2016   Procedure: LAPAROSCOPIC NISSEN FUNDOPLICATION;  Surgeon: Glenna Fellows, MD;  Location: [facility];  Service: General;  Laterality: N/A;  . LUMBAR LAMINECTOMY  1990; 1994; [id number];[id number]   "I've got 2 stainless steel rods; 6 screws; 2 ray cages"took bone from right hip to put in back  . TONSILLECTOMY AND ADENOIDECTOMY  1945  . TOTAL KNEE ARTHROPLASTY Right ~ 1996  . TUBAL LIGATION  ~ 1976    Prior to Admission medications   Medication Sig Start Date End Date Taking? Authorizing Provider  amLODipine (NORVASC) 2.5 MG tablet Take 1 tablet (2.5 mg total) by mouth daily. 11/27/17  Yes Gordy Savers, MD  aspirin 81 MG tablet Take 1 tablet (81 mg total) by mouth daily. For cardiovascular health. 02/07/12  Yes Viviann Spare, FNP  azithromycin (ZITHROMAX) 250 MG tablet TAKE 2 TABLETS DAILY FOR 3 DAYS 04/03/18  Yes Gordy Savers, MD  clonazePAM (KLONOPIN) 0.5 MG tablet TAKE 1 TABLET BY MOUTH 3 TIMES A DAY AS NEEDED 03/05/18  Yes Gordy Savers, MD  gabapentin (NEURONTIN) 100 MG  capsule TAKE 1 CAPSULE BY MOUTH THREE TIMES A DAY 07/08/17  Yes Gordy Savers, MD  losartan-hydrochlorothiazide [facility]) 100-25 MG tablet TAKE 1 TABLET BY MOUTH EVERY DAY 01/22/18  Yes Gordy Savers, MD  meloxicam (MOBIC) 15 MG tablet Take 15 mg by mouth daily. 03/21/18  Yes [provider]  Multiple Vitamin (MULTIVITAMIN) tablet Take 1 tablet by mouth daily. Vitamin supplement. 02/07/12  Yes Viviann Spare, FNP  ondansetron (ZOFRAN) 4 MG tablet Take one tablet every 4-6 hours as needed for nausea 03/18/18  Yes Hilarie Fredrickson, MD  pantoprazole (PROTONIX) 20 MG tablet  Take 20 mg by mouth daily. 02/20/18  Yes [provider]  sertraline (ZOLOFT) 100 MG tablet TAKE 1 TABLET BY MOUTH EVERY DAY 01/13/18  Yes Gordy Savers, MD  traMADol (ULTRAM) 50 MG tablet Take 1-2 tablets (50-100 mg total) by mouth every 4 (four) hours as needed for moderate pain. 02/09/18  Yes Nyoka Cowden, MD  pantoprazole (PROTONIX) 40 MG tablet TAKE 1 TABLET BY MOUTH 3O TO 60 MIN BEFORE YOUR FIRST AND LAST MEALS OF THE DAY Patient not taking: Reported on 04/05/2018 03/31/18   Nyoka Cowden, MD    Scheduled Meds: . amLODipine  2.5 mg Oral Daily  . clonazePAM  0.25 mg Oral BID  . gabapentin  100 mg Oral TID  . losartan  100 mg Oral Daily   And  . hydrochlorothiazide  25 mg Oral Daily  . pantoprazole  40 mg Oral Daily  . sertraline  100 mg Oral QHS   Infusions: . ampicillin-sulbactam (UNASYN) IV Stopped (04/06/18 0928)   PRN Meds: ipratropium-albuterol, ondansetron **OR** ondansetron (ZOFRAN) IV, traMADol   Allergies as of 04/05/2018 - Review Complete 04/05/2018  Allergen Reaction Noted  . Metaxalone Other (See Comments) 05/10/2008  . Morphine Other (See Comments) 08/01/2008  . Tape Other (See Comments) 02/22/2016    Family History  Problem Relation Age of Onset  . Kidney disease Mother   . Hypertension Mother   . Heart disease Father 43       MI  . Suicidality Son   . Colon  cancer Neg Hx   . Esophageal cancer Neg Hx   . Pancreatic cancer Neg Hx     Social History   Socioeconomic History  . Marital status: Widowed    Spouse name: Not on file  . Number of children: 2 D  . Years of education: Not on file  . Highest education level: Not on file  Occupational History    Employer: RETIRED  Social Needs  . Financial resource strain: Not on file  . Food insecurity:    Worry: Not on file    Inability: Not on file  . Transportation needs:    Medical: Not on file    Non-medical: Not on file  Tobacco Use  . Smoking status: Never Smoker  . Smokeless tobacco: Never Used  Substance and Sexual Activity  . Alcohol use: No  . Drug use: No  . Sexual activity: Not Currently  Lifestyle  . Physical activity:    Days per week: Not on file    Minutes per session: Not on file  . Stress: Not on file  Relationships  . Social connections:    Talks on phone: Not on file    Gets together: Not on file    Attends religious service: Not on file    Active member of club or organization: Not on file    Attends meetings of clubs or organizations: Not on file    Relationship status: Not on file  . Intimate partner violence:    Fear of current or ex partner: Not on file    Emotionally abused: Not on file    Physically abused: Not on file    Forced sexual activity: Not on file  Other Topics Concern  . Not on file  Social History Narrative  . Not on file    REVIEW OF SYSTEMS: Constitutional: Some fatigue and weakness.  Nothing profound. ENT:  No nose bleeds Pulm: No shortness of breath.  Clear productive cough. CV:  No palpitations, no LE edema.  GU:  No hematuria, no frequency GI:  Per HPI Heme: No unusual bleeding or bruising. Transfusions: None Neuro:  No headaches, no peripheral tingling or numbness Derm:  No itching, no rash or sores.  Endocrine:  No sweats or chills.  No polyuria or dysuria Immunization: Reviewed. Travel:  None beyond local counties in  last few months.    PHYSICAL EXAM: Vital signs in last 24 hours: Vitals:   04/06/18 0112 04/06/18 0620  BP: (!) 164/74 (!) 162/75  Pulse: 78 81  Resp: 16 18  Temp: 98.4 F (36.9 C) 98.4 F (36.9 C)  SpO2: 95% 94%   Wt Readings from Last 3 Encounters:  04/06/18 150 lb (68 kg)  04/01/18 151 lb (68.5 kg)  03/25/18 151 lb 2 oz (68.5 kg)    General: Patient looks good.  Comfortable.  She does not look ill. Head: No facial asymmetry or swelling.  No signs of head trauma. Eyes: No scleral icterus.  No conjunctival pallor. Ears: Not hard of hearing Nose: No congestion or discharge. Mouth: Moist, clear oral mucosa.  Good dental repair.  Tongue midline. Neck: No JVD, no masses, no thyromegaly. Lungs: No labored breathing.  Dry cough with deep inspiration.  No wheezes or crackles.  No diminished breath sounds. Heart: RRR.  No MRG.  S1, S2 present. Abdomen: Soft.  Not tender or distended.  No masses, no HSM, no hernias, no bruits.  Bowel sounds active..   Rectal: Deferred. Musc/Skeltl: No joint contracture deformities, redness, swelling.  Some arthritic changes in the fingers/hands. Extremities: No CCE. Neurologic: Alert.  Oriented x3.  Good historian.  Moves all 4 limbs with full strength.  No tremors.  No gross deficits. Skin: No rashes, sores, suspicious lesions, telangiectasia. Tattoos: None. Nodes: No cervical adenopathy. Psych: Pleasant, calm, cooperative.  Not depressed or anxious.  Fluid speech.  Intake/Output from previous day: 06/02 0701 - 06/03 0700 In: 380 [P.O.:280; IV Piggyback:100] Out: 300 [Urine:300] Intake/Output this shift: No intake/output data recorded.  LAB RESULTS: Recent Labs    04/05/18 1550 04/06/18 0651  WBC 15.9* 13.4*  HGB 10.6* 9.9*  HCT 33.7* 31.6*  PLT 419* 355   BMET Lab Results  Component Value Date   NA 142 04/06/2018   NA 137 04/05/2018   NA 143 02/01/2018   K 2.9 (L) 04/06/2018   K 3.1 (L) 04/05/2018   K 3.3 (L) 02/01/2018    CL 102 04/06/2018   CL 99 (L) 04/05/2018   CL 103 02/01/2018   CO2 29 04/06/2018   CO2 28 04/05/2018   CO2 30 02/01/2018   GLUCOSE 83 04/06/2018   GLUCOSE 97 04/05/2018   GLUCOSE 111 (H) 02/01/2018   BUN 10 04/06/2018   BUN 12 04/05/2018   BUN 12 02/01/2018   CREATININE 0.84 04/06/2018   CREATININE 0.90 04/05/2018   CREATININE 0.75 02/01/2018   CALCIUM 8.8 (L) 04/06/2018   CALCIUM 8.6 (L) 04/05/2018   CALCIUM 9.1 02/01/2018   LFT Recent Labs    04/05/18 1550  PROT 6.7  ALBUMIN 3.4*  AST 18  ALT 12*  ALKPHOS 70  BILITOT 0.5   PT/INR Lab Results  Component Value Date   INR 0.9 02/06/2009   Hepatitis Panel No results for input(s): HEPBSAG, HCVAB, HEPAIGM, HEPBIGM in the last 72 hours. C-Diff No components found for: CDIFF Lipase     Component Value Date/Time   LIPASE 25 04/05/2018 1552    Drugs of Abuse  Component Value Date/Time   LABOPIA NONE DETECTED 02/05/2012 0058   COCAINSCRNUR NONE DETECTED 02/05/2012 0058   LABBENZ POSITIVE (A) 02/05/2012 0058   AMPHETMU NONE DETECTED 02/05/2012 0058   THCU NONE DETECTED 02/05/2012 0058   LABBARB NONE DETECTED 02/05/2012 0058     RADIOLOGY STUDIES: Dg Chest 2 View  Result Date: 04/05/2018 CLINICAL DATA:  82 year old female with history of nausea. Recent endoscopy procedure 1 week ago, with intermittent vomiting since that time. Black and dark colored emesis. Upper abdominal discomfort. EXAM: CHEST - 2 VIEW COMPARISON:  Chest x-ray 04/01/2018. FINDINGS: Lung volumes are normal. No consolidative airspace disease. No pleural effusions. Diffuse peribronchial cuffing and interstitial prominence, similar to the prior study, suggesting potential interstitial lung disease. No pleural effusions. Nodular density projecting over the right upper lobe which corresponds to the right upper lobe pneumonia better demonstrated on prior chest x-ray 12/22/2017 and chest CT 12/25/2017, likely to reflect an area of post infectious or  inflammatory scarring. No evidence of pulmonary edema. Heart size is normal. Upper mediastinal contours are within normal limits. Aortic atherosclerosis. IMPRESSION: 1. No definite radiographic evidence of acute cardiopulmonary disease. 2. Ill-defined nodular density projecting over the right upper lobe which likely reflects post infectious or inflammatory scarring from prior pneumonia. However, this is more apparent than prior chest x-ray 04/01/2018. The possibility of repeat infection is not entirely excluded. Followup PA and lateral chest X-ray is recommended in 3-4 weeks to ensure resolution and exclude underlying malignancy. 3. Aortic atherosclerosis. Electronically Signed   By: Trudie Reed M.D.   On: 04/05/2018 16:50   Ct Abdomen Pelvis W Contrast  Result Date: 04/05/2018 CLINICAL DATA:  82 y/o F; black and dark colored emesis. Endoscopy procedure 1 week ago. History of esophageal banding procedure 1 year ago. EXAM: CT ABDOMEN AND PELVIS WITH CONTRAST TECHNIQUE: Multidetector CT imaging of the abdomen and pelvis was performed using the standard protocol following bolus administration of intravenous contrast. CONTRAST:  OMNIPAQUE IOHEXOL 300 MG/ML  SOLN COMPARISON:  08/05/2017 CT abdomen and pelvis. 03/24/2018 esophagram. FINDINGS: Lower chest: Increased size of moderate hiatal hernia. Postsurgical changes at the hilum of the diaphragm. Few clustered nodules at the lung bases in bronchovascular distribution. Hepatobiliary: No focal liver abnormality is seen. No gallstones, gallbladder wall thickening, or biliary dilatation. Pancreas: Unremarkable. No pancreatic ductal dilatation or surrounding inflammatory changes. Spleen: Normal in size without focal abnormality. Adrenals/Urinary Tract: Adrenal glands are unremarkable. Kidneys are normal, without renal calculi, focal lesion, or hydronephrosis. Bladder is unremarkable. Stomach/Bowel: Stomach is within normal limits. Appendix appears normal. No  evidence of bowel wall thickening, distention, or inflammatory changes. Mild sigmoid diverticulosis. No findings of acute diverticulitis. There is dense contrast within the distal colon. Vascular/Lymphatic: Aortic atherosclerosis. No enlarged abdominal or pelvic lymph nodes. Reproductive: Status post hysterectomy. No adnexal masses. Other: No abdominal wall hernia or abnormality. No abdominopelvic ascites. Musculoskeletal: No fracture is seen. L2-S1 posterior instrumented fusion and lateral arthrodesis. L4-5 laminectomy. Hardware appears intact and there is no apparent hardware related complication. No acute fracture identified. Moderate dextrocurvature of the thoracolumbar junction. Straightening of lumbar lordosis without listhesis. IMPRESSION: 1. Increased size of moderate hiatal hernia from 08/05/2017 partially visualized and extending above the field of view. 2. Aortic atherosclerosis. 3. Scattered sigmoid diverticulosis, no findings of acute diverticulitis. 4. Few clustered nodules in the lung bases in bronchovascular distribution, possibly aspiration pneumonitis given history of vomiting. Electronically Signed   By: Mitzi Hansen M.D.   On: 04/05/2018 19:07  IMPRESSION:   *   Dark emesis x 1 in pt with progressive dysphagia and nausea/vomiting/regurgitation post Nissen fundoplication.  ? GIB: MW tear, esophagitis, ulcer? No improvement following EGD with dilation of esophageal stenosis in April 2018.  Esophageal manometry was inconclusive as Dr. Lavon Paganini could not determine whether the catheter had traeversed the LES, but with 100% failed swallows findings suggestive of aperistalsis.  *  Aspiration pneumonitis?  Given Z pack last week.  Now on Unsyn.   Necrotizing RUL PNA and abscess 12/2017.   *  Normocytic anemia.    *  Hypokalemia.     PLAN:     *   EGD.  Tomorrow.    *   Potassium needs to be corrected.  Will defer to TS team.    *  Repeat esophageal manometry?      Jennye Moccasin  04/06/2018, 11:52 AM Phone [phone]  I have reviewed the entire case in detail with the above APP and discussed the plan in detail.  Therefore, I agree with the diagnoses recorded above. In addition,  I have personally interviewed and examined the patient. Her daughter-in-law was at the bedside for the entire encounter.  My additional thoughts are as follows:  This patient appears to have esophageal dysmotility that may be related to prior fundoplication. She vomited dark black material, not clear if that was bleeding. She appears to be having severe dysphagia and regurgitation, and it is unknown whether or not that is related to her previous pneumonia and chronic cough.  The plan is for an upper endoscopy tomorrow with a possible dilation if there is a stricture. I would also like to assess for any possible sources of GI bleeding. After that, she may need a repeat attempt at esophageal manometry and perhaps upper endoscopy to help pass the probe and get complete data. We are trying to decide whether or not she may need a take down of the fundoplication.  She is agreeable to an upper endoscopy after discussion of procedure and risks.anesthesia support required due to COPD. Increased risk procedure.  [name] Office:[phone]

## 2018-04-06 NOTE — H&P (View-Only) (Signed)
[facility] Consult: 11:52 AM 04/06/2018  LOS: 0 days    Referring Provider: Dr Artist Pais, resident  Primary Care Physician:  Gordy Savers, MD Primary Gastroenterologist:  Dr [name]   Reason for Consultation:  Nausea, hematemesis.     HPI: Julie Rice is a 82 y.o. female.  PMH  IDA.  Spinal DDD/DJD, s/p lumbar spine surgery.  Lung abscesses, RUL necrotizing PNA treated 12/2017.   GERD.  Esophageal strictures.  HH.   07/2008 EGD.  Esophageal stricture dilated.  Gastritis.   02/2016 EGD.   5 cm HH.  Dilated distal esophageal stricture.   07/2016 Nissen fundoplication. 06/2016 Ultrasound.  Normal.     09/2016 Esophagram. Normal looking Nissen wrap.  Significant esophageal dysmotility with spasm, stasis, tertiary contractions.  Pill passed.       02/2017 EGD for dysphagia.  Esophageal dilation of distal esophageal stenosis.    08/2017 Colonoscopy for chronic RUQ pain.  Previous unremarkable colonoscopies 2003, 2008.  To TI. Internal hemorrhoids, O/w normal exam.  Pt with ongoing liquid, solid, dysphagia. + nausea.  No improvement after 02/2017 dilation.  Compliant with PPI.   03/24/18 esophagram.  Moderate hiatal hernia with mild GE narrowing that is likely from persistent Nissen wrap. A 13 mm barium tablet easily reached the stomach.  No specific dysmotility or stasis seen on this exam. 03/25/18 esophageal manometry.  Findings sugg off aperistalsis with 100% failed swallows, but unable to determine if probe traversed LES.  May have EGJ outflow obstruction from Nissen fundoplication in the setting of esophageal aperistalsis.  DDx includes achalasia and scleroderma esophagus.  Dr Lavon Paganini suggested repeating study with endoscopic placement of catheter.    Generally the patient has trouble with both liquids and solid  intake.  Sometimes it will stay down other times it reaches a certain point and "turns around and comes back up".  She is lost about 16 pounds in the last 3 or 4 months.  She is developed a predominantly nonproductive cough. Treated for PNA with Z pack late last week.  CXR for cough, elevated WBCs showed emphysema, interstitial prominence.  Yesterday morning she ate cereal.  Around noon she became acutely nauseous and vomited a large amount of black but clear liquid.  It was not obviously bloody.  She did not and has not had a bowel movement, her last bowel movement was 6/1 and was formed, brown. Presented yest afternoon to ED after vomiting dark material.  Since the episode of dark emesis she has not had any repeat episodes.  She has vomited some partially digested crackers and liquids.  In general she has been feeling weak, tired and emotionally drained by her problems swallowing. Hgb 10.6 >> 9.9.    Avg ~ 11.  11.1 on 04/01/18.  MCV 89. K 3.1.  Renal fx normal.  LFTs and Lipase normal.   CT ab/pelvis with contrast: size of HH increased.  Aortic atherosclerosis.  Scattered sigmoid tics.  Bronchovascular distribution of nodules in lung bases, ? Aspiration pneumonitis in setting of vomiting?  Patient takes low-dose aspirin  daily but no other aspirin or NSAID products. No alcohol to speak of.  Patient is widowed, lives alone and does all of her own cooking/cleaning.  She is highly functional.  Past Medical History:  Diagnosis Date  . Anemia   . Anxiety   . Arthritis    ra and oa  . Bipolar disorder (HCC)   . Blood transfusion 1991   autologous pts own blood given   . CAD (coronary artery disease)   . Chest pain    "@ rest, lying down, w/exertion"  . COPD (chronic obstructive pulmonary disease) (HCC)   . Depression   . Esophageal stricture   . Fecal occult blood test positive   . Fibromyalgia   . GERD (gastroesophageal reflux disease)   . Hiatal hernia   . Hyperlipidemia   . Hypertension    . Internal hemorrhoids   . Lumbago   . Mitral regurgitation   . PONV (postoperative nausea and vomiting)    severe ponv, did ok with last surgery  . Rectal bleeding   . Spondylosis   . TIA (transient ischemic attack) 2013  . Urinary incontinence    wears depends    Past Surgical History:  Procedure Laterality Date  . ABDOMINAL HYSTERECTOMY  1982  . BLADDER SUSPENSION  1980's  . CATARACT EXTRACTION W/ INTRAOCULAR LENS  IMPLANT, BILATERAL Bilateral 2010  . DILATION AND CURETTAGE OF UTERUS  1961  . ESOPHAGEAL MANOMETRY N/A 03/25/2018   Procedure: ESOPHAGEAL MANOMETRY (EM);  Surgeon: Napoleon Form, MD;  Location: [facility];  Service: Endoscopy;  Laterality: N/A;  . HIATAL HERNIA REPAIR N/A 08/02/2016   Procedure: LAPAROSCOPIC REPAIR OF LARGE  HIATAL HERNIA;  Surgeon: Glenna Fellows, MD;  Location: [facility];  Service: General;  Laterality: N/A;  . LAPAROSCOPIC NISSEN FUNDOPLICATION N/A 08/02/2016   Procedure: LAPAROSCOPIC NISSEN FUNDOPLICATION;  Surgeon: Glenna Fellows, MD;  Location: [facility];  Service: General;  Laterality: N/A;  . LUMBAR LAMINECTOMY  1990; 1994; [id number];[id number]   "I've got 2 stainless steel rods; 6 screws; 2 ray cages"took bone from right hip to put in back  . TONSILLECTOMY AND ADENOIDECTOMY  1945  . TOTAL KNEE ARTHROPLASTY Right ~ 1996  . TUBAL LIGATION  ~ 1976    Prior to Admission medications   Medication Sig Start Date End Date Taking? Authorizing Provider  amLODipine (NORVASC) 2.5 MG tablet Take 1 tablet (2.5 mg total) by mouth daily. 11/27/17  Yes Gordy Savers, MD  aspirin 81 MG tablet Take 1 tablet (81 mg total) by mouth daily. For cardiovascular health. 02/07/12  Yes Viviann Spare, FNP  azithromycin (ZITHROMAX) 250 MG tablet TAKE 2 TABLETS DAILY FOR 3 DAYS 04/03/18  Yes Gordy Savers, MD  clonazePAM (KLONOPIN) 0.5 MG tablet TAKE 1 TABLET BY MOUTH 3 TIMES A DAY AS NEEDED 03/05/18  Yes Gordy Savers, MD  gabapentin (NEURONTIN) 100 MG  capsule TAKE 1 CAPSULE BY MOUTH THREE TIMES A DAY 07/08/17  Yes Gordy Savers, MD  losartan-hydrochlorothiazide [facility]) 100-25 MG tablet TAKE 1 TABLET BY MOUTH EVERY DAY 01/22/18  Yes Gordy Savers, MD  meloxicam (MOBIC) 15 MG tablet Take 15 mg by mouth daily. 03/21/18  Yes [provider]  Multiple Vitamin (MULTIVITAMIN) tablet Take 1 tablet by mouth daily. Vitamin supplement. 02/07/12  Yes Viviann Spare, FNP  ondansetron (ZOFRAN) 4 MG tablet Take one tablet every 4-6 hours as needed for nausea 03/18/18  Yes Hilarie Fredrickson, MD  pantoprazole (PROTONIX) 20 MG tablet  Take 20 mg by mouth daily. 02/20/18  Yes [provider]  sertraline (ZOLOFT) 100 MG tablet TAKE 1 TABLET BY MOUTH EVERY DAY 01/13/18  Yes Gordy Savers, MD  traMADol (ULTRAM) 50 MG tablet Take 1-2 tablets (50-100 mg total) by mouth every 4 (four) hours as needed for moderate pain. 02/09/18  Yes Nyoka Cowden, MD  pantoprazole (PROTONIX) 40 MG tablet TAKE 1 TABLET BY MOUTH 3O TO 60 MIN BEFORE YOUR FIRST AND LAST MEALS OF THE DAY Patient not taking: Reported on 04/05/2018 03/31/18   Nyoka Cowden, MD    Scheduled Meds: . amLODipine  2.5 mg Oral Daily  . clonazePAM  0.25 mg Oral BID  . gabapentin  100 mg Oral TID  . losartan  100 mg Oral Daily   And  . hydrochlorothiazide  25 mg Oral Daily  . pantoprazole  40 mg Oral Daily  . sertraline  100 mg Oral QHS   Infusions: . ampicillin-sulbactam (UNASYN) IV Stopped (04/06/18 0928)   PRN Meds: ipratropium-albuterol, ondansetron **OR** ondansetron (ZOFRAN) IV, traMADol   Allergies as of 04/05/2018 - Review Complete 04/05/2018  Allergen Reaction Noted  . Metaxalone Other (See Comments) 05/10/2008  . Morphine Other (See Comments) 08/01/2008  . Tape Other (See Comments) 02/22/2016    Family History  Problem Relation Age of Onset  . Kidney disease Mother   . Hypertension Mother   . Heart disease Father 43       MI  . Suicidality Son   . Colon  cancer Neg Hx   . Esophageal cancer Neg Hx   . Pancreatic cancer Neg Hx     Social History   Socioeconomic History  . Marital status: Widowed    Spouse name: Not on file  . Number of children: 2 D  . Years of education: Not on file  . Highest education level: Not on file  Occupational History    Employer: RETIRED  Social Needs  . Financial resource strain: Not on file  . Food insecurity:    Worry: Not on file    Inability: Not on file  . Transportation needs:    Medical: Not on file    Non-medical: Not on file  Tobacco Use  . Smoking status: Never Smoker  . Smokeless tobacco: Never Used  Substance and Sexual Activity  . Alcohol use: No  . Drug use: No  . Sexual activity: Not Currently  Lifestyle  . Physical activity:    Days per week: Not on file    Minutes per session: Not on file  . Stress: Not on file  Relationships  . Social connections:    Talks on phone: Not on file    Gets together: Not on file    Attends religious service: Not on file    Active member of club or organization: Not on file    Attends meetings of clubs or organizations: Not on file    Relationship status: Not on file  . Intimate partner violence:    Fear of current or ex partner: Not on file    Emotionally abused: Not on file    Physically abused: Not on file    Forced sexual activity: Not on file  Other Topics Concern  . Not on file  Social History Narrative  . Not on file    REVIEW OF SYSTEMS: Constitutional: Some fatigue and weakness.  Nothing profound. ENT:  No nose bleeds Pulm: No shortness of breath.  Clear productive cough. CV:  No palpitations, no LE edema.  GU:  No hematuria, no frequency GI:  Per HPI Heme: No unusual bleeding or bruising. Transfusions: None Neuro:  No headaches, no peripheral tingling or numbness Derm:  No itching, no rash or sores.  Endocrine:  No sweats or chills.  No polyuria or dysuria Immunization: Reviewed. Travel:  None beyond local counties in  last few months.    PHYSICAL EXAM: Vital signs in last 24 hours: Vitals:   04/06/18 0112 04/06/18 0620  BP: (!) 164/74 (!) 162/75  Pulse: 78 81  Resp: 16 18  Temp: 98.4 F (36.9 C) 98.4 F (36.9 C)  SpO2: 95% 94%   Wt Readings from Last 3 Encounters:  04/06/18 150 lb (68 kg)  04/01/18 151 lb (68.5 kg)  03/25/18 151 lb 2 oz (68.5 kg)    General: Patient looks good.  Comfortable.  She does not look ill. Head: No facial asymmetry or swelling.  No signs of head trauma. Eyes: No scleral icterus.  No conjunctival pallor. Ears: Not hard of hearing Nose: No congestion or discharge. Mouth: Moist, clear oral mucosa.  Good dental repair.  Tongue midline. Neck: No JVD, no masses, no thyromegaly. Lungs: No labored breathing.  Dry cough with deep inspiration.  No wheezes or crackles.  No diminished breath sounds. Heart: RRR.  No MRG.  S1, S2 present. Abdomen: Soft.  Not tender or distended.  No masses, no HSM, no hernias, no bruits.  Bowel sounds active..   Rectal: Deferred. Musc/Skeltl: No joint contracture deformities, redness, swelling.  Some arthritic changes in the fingers/hands. Extremities: No CCE. Neurologic: Alert.  Oriented x3.  Good historian.  Moves all 4 limbs with full strength.  No tremors.  No gross deficits. Skin: No rashes, sores, suspicious lesions, telangiectasia. Tattoos: None. Nodes: No cervical adenopathy. Psych: Pleasant, calm, cooperative.  Not depressed or anxious.  Fluid speech.  Intake/Output from previous day: 06/02 0701 - 06/03 0700 In: 380 [P.O.:280; IV Piggyback:100] Out: 300 [Urine:300] Intake/Output this shift: No intake/output data recorded.  LAB RESULTS: Recent Labs    04/05/18 1550 04/06/18 0651  WBC 15.9* 13.4*  HGB 10.6* 9.9*  HCT 33.7* 31.6*  PLT 419* 355   BMET Lab Results  Component Value Date   NA 142 04/06/2018   NA 137 04/05/2018   NA 143 02/01/2018   K 2.9 (L) 04/06/2018   K 3.1 (L) 04/05/2018   K 3.3 (L) 02/01/2018    CL 102 04/06/2018   CL 99 (L) 04/05/2018   CL 103 02/01/2018   CO2 29 04/06/2018   CO2 28 04/05/2018   CO2 30 02/01/2018   GLUCOSE 83 04/06/2018   GLUCOSE 97 04/05/2018   GLUCOSE 111 (H) 02/01/2018   BUN 10 04/06/2018   BUN 12 04/05/2018   BUN 12 02/01/2018   CREATININE 0.84 04/06/2018   CREATININE 0.90 04/05/2018   CREATININE 0.75 02/01/2018   CALCIUM 8.8 (L) 04/06/2018   CALCIUM 8.6 (L) 04/05/2018   CALCIUM 9.1 02/01/2018   LFT Recent Labs    04/05/18 1550  PROT 6.7  ALBUMIN 3.4*  AST 18  ALT 12*  ALKPHOS 70  BILITOT 0.5   PT/INR Lab Results  Component Value Date   INR 0.9 02/06/2009   Hepatitis Panel No results for input(s): HEPBSAG, HCVAB, HEPAIGM, HEPBIGM in the last 72 hours. C-Diff No components found for: CDIFF Lipase     Component Value Date/Time   LIPASE 25 04/05/2018 1552    Drugs of Abuse  Component Value Date/Time   LABOPIA NONE DETECTED 02/05/2012 0058   COCAINSCRNUR NONE DETECTED 02/05/2012 0058   LABBENZ POSITIVE (A) 02/05/2012 0058   AMPHETMU NONE DETECTED 02/05/2012 0058   THCU NONE DETECTED 02/05/2012 0058   LABBARB NONE DETECTED 02/05/2012 0058     RADIOLOGY STUDIES: Dg Chest 2 View  Result Date: 04/05/2018 CLINICAL DATA:  82 year old female with history of nausea. Recent endoscopy procedure 1 week ago, with intermittent vomiting since that time. Black and dark colored emesis. Upper abdominal discomfort. EXAM: CHEST - 2 VIEW COMPARISON:  Chest x-ray 04/01/2018. FINDINGS: Lung volumes are normal. No consolidative airspace disease. No pleural effusions. Diffuse peribronchial cuffing and interstitial prominence, similar to the prior study, suggesting potential interstitial lung disease. No pleural effusions. Nodular density projecting over the right upper lobe which corresponds to the right upper lobe pneumonia better demonstrated on prior chest x-ray 12/22/2017 and chest CT 12/25/2017, likely to reflect an area of post infectious or  inflammatory scarring. No evidence of pulmonary edema. Heart size is normal. Upper mediastinal contours are within normal limits. Aortic atherosclerosis. IMPRESSION: 1. No definite radiographic evidence of acute cardiopulmonary disease. 2. Ill-defined nodular density projecting over the right upper lobe which likely reflects post infectious or inflammatory scarring from prior pneumonia. However, this is more apparent than prior chest x-ray 04/01/2018. The possibility of repeat infection is not entirely excluded. Followup PA and lateral chest X-ray is recommended in 3-4 weeks to ensure resolution and exclude underlying malignancy. 3. Aortic atherosclerosis. Electronically Signed   By: Trudie Reed M.D.   On: 04/05/2018 16:50   Ct Abdomen Pelvis W Contrast  Result Date: 04/05/2018 CLINICAL DATA:  82 y/o F; black and dark colored emesis. Endoscopy procedure 1 week ago. History of esophageal banding procedure 1 year ago. EXAM: CT ABDOMEN AND PELVIS WITH CONTRAST TECHNIQUE: Multidetector CT imaging of the abdomen and pelvis was performed using the standard protocol following bolus administration of intravenous contrast. CONTRAST:  OMNIPAQUE IOHEXOL 300 MG/ML  SOLN COMPARISON:  08/05/2017 CT abdomen and pelvis. 03/24/2018 esophagram. FINDINGS: Lower chest: Increased size of moderate hiatal hernia. Postsurgical changes at the hilum of the diaphragm. Few clustered nodules at the lung bases in bronchovascular distribution. Hepatobiliary: No focal liver abnormality is seen. No gallstones, gallbladder wall thickening, or biliary dilatation. Pancreas: Unremarkable. No pancreatic ductal dilatation or surrounding inflammatory changes. Spleen: Normal in size without focal abnormality. Adrenals/Urinary Tract: Adrenal glands are unremarkable. Kidneys are normal, without renal calculi, focal lesion, or hydronephrosis. Bladder is unremarkable. Stomach/Bowel: Stomach is within normal limits. Appendix appears normal. No  evidence of bowel wall thickening, distention, or inflammatory changes. Mild sigmoid diverticulosis. No findings of acute diverticulitis. There is dense contrast within the distal colon. Vascular/Lymphatic: Aortic atherosclerosis. No enlarged abdominal or pelvic lymph nodes. Reproductive: Status post hysterectomy. No adnexal masses. Other: No abdominal wall hernia or abnormality. No abdominopelvic ascites. Musculoskeletal: No fracture is seen. L2-S1 posterior instrumented fusion and lateral arthrodesis. L4-5 laminectomy. Hardware appears intact and there is no apparent hardware related complication. No acute fracture identified. Moderate dextrocurvature of the thoracolumbar junction. Straightening of lumbar lordosis without listhesis. IMPRESSION: 1. Increased size of moderate hiatal hernia from 08/05/2017 partially visualized and extending above the field of view. 2. Aortic atherosclerosis. 3. Scattered sigmoid diverticulosis, no findings of acute diverticulitis. 4. Few clustered nodules in the lung bases in bronchovascular distribution, possibly aspiration pneumonitis given history of vomiting. Electronically Signed   By: Mitzi Hansen M.D.   On: 04/05/2018 19:07  IMPRESSION:   *   Dark emesis x 1 in pt with progressive dysphagia and nausea/vomiting/regurgitation post Nissen fundoplication.  ? GIB: MW tear, esophagitis, ulcer? No improvement following EGD with dilation of esophageal stenosis in April 2018.  Esophageal manometry was inconclusive as Dr. Lavon Paganini could not determine whether the catheter had traeversed the LES, but with 100% failed swallows findings suggestive of aperistalsis.  *  Aspiration pneumonitis?  Given Z pack last week.  Now on Unsyn.   Necrotizing RUL PNA and abscess 12/2017.   *  Normocytic anemia.    *  Hypokalemia.     PLAN:     *   EGD.  Tomorrow.    *   Potassium needs to be corrected.  Will defer to TS team.    *  Repeat esophageal manometry?      Jennye Moccasin  04/06/2018, 11:52 AM Phone [phone]  I have reviewed the entire case in detail with the above APP and discussed the plan in detail.  Therefore, I agree with the diagnoses recorded above. In addition,  I have personally interviewed and examined the patient. Her daughter-in-law was at the bedside for the entire encounter.  My additional thoughts are as follows:  This patient appears to have esophageal dysmotility that may be related to prior fundoplication. She vomited dark black material, not clear if that was bleeding. She appears to be having severe dysphagia and regurgitation, and it is unknown whether or not that is related to her previous pneumonia and chronic cough.  The plan is for an upper endoscopy tomorrow with a possible dilation if there is a stricture. I would also like to assess for any possible sources of GI bleeding. After that, she may need a repeat attempt at esophageal manometry and perhaps upper endoscopy to help pass the probe and get complete data. We are trying to decide whether or not she may need a take down of the fundoplication.  She is agreeable to an upper endoscopy after discussion of procedure and risks.anesthesia support required due to COPD. Increased risk procedure.  [name] Office:[phone]

## 2018-04-06 NOTE — Evaluation (Signed)
Occupational Therapy Evaluation Patient Details Name: Julie Rice MRN: [id number] DOB: [birth year] Today's Date: 04/06/2018    History of Present Illness Julie Rice is a 82 y.o. female presenting with nausea and hematemesis. PMH is significant for COPD, esophageal stricture, positive hemoccult blood tests, hiatal hernia s/p application, HTN, HLD, internal hemorrhoids, recent pneumonia with abscess in Feb 2019    Clinical Impression   PTA, pt was independent with ADL and functional mobility. She lives alone and is independent with IADL. Pt currently presents with some generalized weakness and decreased activity tolerance for ADL as well as instability on her feet impacting her independence and safety with ADL participation. Pt was able to complete toilet transfers with close supervision without assistive device but did note some furniture walking. She is able to complete LB ADL and standing grooming tasks with supervision as well. Pt would benefit from continued OT services post-acute D/C to maximize return to independence. Recommend home health OT follow-up post-acute D/C. Will continue to follow while admitted.    Follow Up Recommendations  Home health OT;Supervision/Assistance - 24 hour(initial 24 hour assist at home)    Equipment Recommendations  None recommended by OT    Recommendations for Other Services       Precautions / Restrictions Precautions Precautions: Fall Precaution Comments: pt slightly unsteady on her feet Restrictions Weight Bearing Restrictions: No      Mobility Bed Mobility               General bed mobility comments: OOB in chair on my arrival.   Transfers Overall transfer level: Needs assistance Equipment used: None Transfers: Sit to/from Stand Sit to Stand: Supervision         General transfer comment: Supervision for safety.     Balance Overall balance assessment: Mild deficits observed, not formally tested                                         ADL either performed or assessed with clinical judgement   ADL Overall ADL's : Needs assistance/impaired Eating/Feeding: Set up;Sitting Eating/Feeding Details (indicate cue type and reason): not able to keep anything down per pt Grooming: Set up;Sitting   Upper Body Bathing: Set up;Sitting   Lower Body Bathing: Supervison/ safety;Sit to/from stand   Upper Body Dressing : Set up;Sitting   Lower Body Dressing: Supervision/safety;Sit to/from stand   Toilet Transfer: Supervision/safety;Ambulation   Toileting- Clothing Manipulation and Hygiene: Supervision/safety;Sit to/from stand       Functional mobility during ADLs: Supervision/safety General ADL Comments: Very close supervision during toilet transfers due to some instability. Furniture walking throughout.      Vision Baseline Vision/History: No visual deficits Patient Visual Report: No change from baseline Vision Assessment?: No apparent visual deficits     Perception     Praxis      Pertinent Vitals/Pain Pain Assessment: No/denies pain     Hand Dominance     Extremity/Trunk Assessment Upper Extremity Assessment Upper Extremity Assessment: Overall WFL for tasks assessed   Lower Extremity Assessment Lower Extremity Assessment: Generalized weakness       Communication Communication Communication: No difficulties   Cognition Arousal/Alertness: Awake/alert Behavior During Therapy: WFL for tasks assessed/performed Overall Cognitive Status: Within Functional Limits for tasks assessed  General Comments  Pt reports that she has been getting weaker since January when she first had pneumonia.     Exercises     Shoulder Instructions      Home Living Family/patient expects to be discharged to:: Private residence Living Arrangements: Alone Available Help at Discharge: Family;Friend(s);Available PRN/intermittently Type of Home:  House Home Access: Stairs to enter [employer] of Steps: 3         Bathroom Shower/Tub: Tub/shower unit(takes "bubble baths")   Bathroom Toilet: Standard     Home Equipment: Environmental consultant - 2 wheels;Shower seat;Grab bars - tub/shower   Additional Comments: Pt has shower seat but has not been using this.       Prior Functioning/Environment Level of Independence: Independent                 OT Problem List: Decreased activity tolerance;Impaired balance (sitting and/or standing);Decreased knowledge of use of DME or AE      OT Treatment/Interventions: Self-care/ADL training;Therapeutic exercise;Energy conservation;DME and/or AE instruction;Therapeutic activities;Patient/family education;Balance training    OT Goals(Current goals can be found in the care plan section) Acute Rehab OT Goals Patient Stated Goal: to figure out what is going on OT Goal Formulation: With patient Time For Goal Achievement: 04/20/18 Potential to Achieve Goals: Good ADL Goals Pt Will Perform Grooming: with modified independence;standing Pt Will Perform Lower Body Dressing: with modified independence;sit to/from stand Pt Will Transfer to Toilet: with modified independence;ambulating;regular height toilet Pt Will Perform Toileting - Clothing Manipulation and hygiene: with modified independence;sit to/from stand Pt Will Perform Tub/Shower Transfer: with modified independence;Tub transfer;shower seat;rolling walker Additional ADL Goal #1: Pt will verbalize 3 home modification strategies to prevent falls.  OT Frequency: Min 2X/week   Barriers to D/C:            Co-evaluation              AM-PAC PT "6 Clicks" Daily Activity     Outcome Measure Help from another person eating meals?: None Help from another person taking care of personal grooming?: A Little Help from another person toileting, which includes using toliet, bedpan, or urinal?: A Little Help from another person bathing  (including washing, rinsing, drying)?: A Little Help from another person to put on and taking off regular upper body clothing?: None Help from another person to put on and taking off regular lower body clothing?: A Little 6 Click Score: 20   End of Session Nurse Communication: Mobility status(mobility tech - mobility status)  Activity Tolerance: Patient tolerated treatment well Patient left: in chair;with call bell/phone within reach  OT Visit Diagnosis: Other abnormalities of gait and mobility (R26.89);Muscle weakness (generalized) (M62.81)                Time: 6659-9357 OT Time Calculation (min): 10 min Charges:  OT General Charges $OT Visit: 1 Visit OT Evaluation $OT Eval Low Complexity: 1 Low G-Codes:     Doristine Section, MS OTR/L  Pager: [phone]   Kyro Joswick A Achille Xiang 04/06/2018, 1:10 PM

## 2018-04-06 NOTE — Progress Notes (Signed)
0100 received pt from ED. A&O x4, pain 4/10 on R side persistent for several weeks.

## 2018-04-06 NOTE — Progress Notes (Signed)
Family Medicine progress update  Paged about BP of 187/81 and HR 88. Hemodynamically stable. Will give one time dose of a labetalol 10mg  IV.  MD PGY-1 Family Medicine Resident

## 2018-04-06 NOTE — Progress Notes (Signed)
Family Medicine Teaching Service Daily Progress Note Intern Pager: [phone]  Patient name: Julie Rice Medical record number: [id number] Date of birth: 27-Apr-1936 Age: 82 y.o. Gender: female  Primary Care Provider: Gordy Savers, MD Consultants: GI Code Status: DNR/DNI  Pt Overview and Major Events to Date:  Admitted on 04/05/18  Assessment and Plan:  Julie Rice is a 82 y.o. female presenting with nausea and hematemesis. PMH is significant for COPD, esophageal stricture, positivehemoccult blood tests, hiatalhernia s/papplication, HTN, HLD, internal hemorrhoids,recent pneumonia with abscess in Feb 2019   Aspiration pneumonia. CXR neg for infiltrate and patient afebrile and O2 sat in mid 90s on RA but CT abd showing clustered nodules in lung bases. Since patient also endorsing worsening SOB on ambulation over the last few days will cover for aspiration PNA with IV ABX as patient is not tolerating po.  WBC elevated but continues to trend down (18.3 on 5/29>15.9>13.4 on 6/3), possibly indicating that the infection is responding to current antibiotics. - monitor resp status - s/p azithromycin as outpt 5/31-6/1 - IV unasyn (6/3-) - will transition to Augmentin possibly on 6/4 - prn duonebs  Hematemesis. In the setting of recent endoscopy for esophageal stricture on 03/25/18. Patient has long standing history of esophageal strictures and hiatal hernia with multiple surgeries. CT abd showing increased size of mod hiatal hernia that likely could be cause of abdominal pain. Hgb close to baseline at 9.9 on 6/3 (baseline around 10.5-12). - GI consulted by ED  - GI recs: EGD on 6/4, possible repeat manometry - clear liquid diet - monitor Hgb daily - prn zofran - protonix 40mg  qd  HTN. BP has ranged from 107/60 to 169/70 since admission - continue home norvasc 2.5mg  qd, losartan-HCTZ 100-25mg  qd.  GAD, MDD. - continue home klonopin 0.25mg  BID, zoloft 100mg  qd  Chronic  pain, osteoarthritis - continue home tramadol and gabapentin   FEN/GI: clear liquid diet PPx: SCD  Disposition: continue inpatient stay until on PO abx and cleared by GI  Subjective:  Continues to have nausea, heart burn, and difficulties swallowing liquids and solids.    Objective: Temp:  [98.4 F (36.9 C)-98.6 F (37 C)] 98.4 F (36.9 C) (06/03 0620) Pulse Rate:  [42-95] 81 (06/03 0620) Resp:  [12-27] 18 (06/03 0620) BP: (107-195)/(60-117) 162/75 (06/03 0620) SpO2:  [90 %-99 %] 94 % (06/03 0620) Weight:  [150 lb (68 kg)-151 lb (68.5 kg)] 150 lb (68 kg) (06/03 0112) Physical Exam: General: lying in bed comfortably, NAD Cardiovascular: RRR, no MRG Respiratory: cough during auscultation, no crackles, rhonchi, or wheeze Abdomen: soft, nontender Extremities: no edema, normal ROM  Laboratory: Recent Labs  Lab 04/01/18 1641 04/05/18 1550  WBC 18.3 Repeated and verified X2.* 15.9*  HGB 11.1* 10.6*  HCT 34.1* 33.7*  PLT 401.0* 419*   Recent Labs  Lab 04/05/18 1550  NA 137  K 3.1*  CL 99*  CO2 28  BUN 12  CREATININE 0.90  CALCIUM 8.6*  PROT 6.7  BILITOT 0.5  ALKPHOS 70  ALT 12*  AST 18  GLUCOSE 97    Imaging/Diagnostic Tests: Dg Chest 2 View  Result Date: 04/05/2018 CLINICAL DATA:  82 year old female with history of nausea. Recent endoscopy procedure 1 week ago, with intermittent vomiting since that time. Black and dark colored emesis. Upper abdominal discomfort. EXAM: CHEST - 2 VIEW COMPARISON:  Chest x-ray 04/01/2018. FINDINGS: Lung volumes are normal. No consolidative airspace disease. No pleural effusions. Diffuse peribronchial cuffing and interstitial prominence, similar to  the prior study, suggesting potential interstitial lung disease. No pleural effusions. Nodular density projecting over the right upper lobe which corresponds to the right upper lobe pneumonia better demonstrated on prior chest x-ray 12/22/2017 and chest CT 12/25/2017, likely to reflect an  area of post infectious or inflammatory scarring. No evidence of pulmonary edema. Heart size is normal. Upper mediastinal contours are within normal limits. Aortic atherosclerosis. IMPRESSION: 1. No definite radiographic evidence of acute cardiopulmonary disease. 2. Ill-defined nodular density projecting over the right upper lobe which likely reflects post infectious or inflammatory scarring from prior pneumonia. However, this is more apparent than prior chest x-ray 04/01/2018. The possibility of repeat infection is not entirely excluded. Followup PA and lateral chest X-ray is recommended in 3-4 weeks to ensure resolution and exclude underlying malignancy. 3. Aortic atherosclerosis. Electronically Signed   By: Trudie Reed M.D.   On: 04/05/2018 16:50   Dg Chest 2 View  Result Date: 04/02/2018 CLINICAL DATA:  Cough, congestion EXAM: CHEST - 2 VIEW COMPARISON:  02/05/2018 FINDINGS: Diffuse interstitial prominence throughout the lungs. Mild hyperinflation. Heart is mildly enlarged. No effusions are acute bony abnormality. IMPRESSION: Mild interstitial prominence within the lungs with cardiomegaly. This could reflect early interstitial edema or chronic interstitial changes. Mild emphysema. Electronically Signed   By: Charlett Nose M.D.   On: 04/02/2018 08:15   Ct Abdomen Pelvis W Contrast  Result Date: 04/05/2018 CLINICAL DATA:  82 y/o F; black and dark colored emesis. Endoscopy procedure 1 week ago. History of esophageal banding procedure 1 year ago. EXAM: CT ABDOMEN AND PELVIS WITH CONTRAST TECHNIQUE: Multidetector CT imaging of the abdomen and pelvis was performed using the standard protocol following bolus administration of intravenous contrast. CONTRAST:  OMNIPAQUE IOHEXOL 300 MG/ML  SOLN COMPARISON:  08/05/2017 CT abdomen and pelvis. 03/24/2018 esophagram. FINDINGS: Lower chest: Increased size of moderate hiatal hernia. Postsurgical changes at the hilum of the diaphragm. Few clustered nodules at  the lung bases in bronchovascular distribution. Hepatobiliary: No focal liver abnormality is seen. No gallstones, gallbladder wall thickening, or biliary dilatation. Pancreas: Unremarkable. No pancreatic ductal dilatation or surrounding inflammatory changes. Spleen: Normal in size without focal abnormality. Adrenals/Urinary Tract: Adrenal glands are unremarkable. Kidneys are normal, without renal calculi, focal lesion, or hydronephrosis. Bladder is unremarkable. Stomach/Bowel: Stomach is within normal limits. Appendix appears normal. No evidence of bowel wall thickening, distention, or inflammatory changes. Mild sigmoid diverticulosis. No findings of acute diverticulitis. There is dense contrast within the distal colon. Vascular/Lymphatic: Aortic atherosclerosis. No enlarged abdominal or pelvic lymph nodes. Reproductive: Status post hysterectomy. No adnexal masses. Other: No abdominal wall hernia or abnormality. No abdominopelvic ascites. Musculoskeletal: No fracture is seen. L2-S1 posterior instrumented fusion and lateral arthrodesis. L4-5 laminectomy. Hardware appears intact and there is no apparent hardware related complication. No acute fracture identified. Moderate dextrocurvature of the thoracolumbar junction. Straightening of lumbar lordosis without listhesis. IMPRESSION: 1. Increased size of moderate hiatal hernia from 08/05/2017 partially visualized and extending above the field of view. 2. Aortic atherosclerosis. 3. Scattered sigmoid diverticulosis, no findings of acute diverticulitis. 4. Few clustered nodules in the lung bases in bronchovascular distribution, possibly aspiration pneumonitis given history of vomiting. Electronically Signed   By: Mitzi Hansen M.D.   On: 04/05/2018 19:07   Dg Esophagus  Result Date: 03/24/2018 CLINICAL DATA:  Nausea. History of hiatal hernia with repair infant application. Food sticking EXAM: ESOPHOGRAM / BARIUM SWALLOW / BARIUM TABLET STUDY TECHNIQUE:  Combined double contrast and single contrast examination performed using effervescent crystals,  thick barium liquid, and thin barium liquid. The patient was observed with fluoroscopy swallowing a 13 mm barium sulphate tablet. FLUOROSCOPY TIME:  Fluoroscopy Time:  1 minutes 1 second Radiation Exposure Index (if provided by the fluoroscopic device): 3.7 mGy Number of Acquired Spot Images: Chest CT 12/25/2017. Modified barium swallow 05/14/2016 COMPARISON:  05/14/2016 FINDINGS: Pharyngeal phase is negative for aspiration, laryngeal penetration, stasis, or diverticulum. Moderate hiatal hernia with supra adjacent smooth narrowing that is likely from persistent Nissen wrap based on abdominal CT 08/05/2017. No fixed stricture was encountered, barium tablet easily reached the stomach. Esophageal motility in the RAO semi recumbent position was unremarkable. IMPRESSION: 1. Moderate hiatal hernia with mild GE narrowing that is likely from persistent Nissen wrap. A 13 mm barium tablet easily reached the stomach. 2. Debris/contrast has been seen in the esophagus on chest CT from February 2019 and abdominal CT from 08/05/2017, but no specific dysmotility or stasis seen on this exam. Electronically Signed   By: Marnee Spring M.D.   On: 03/24/2018 12:03     Lennox Solders, MD 04/06/2018, 7:27 AM PGY-1, [facility] FPTS Intern pager: [phone], text pages welcome

## 2018-04-06 NOTE — Progress Notes (Signed)
Pharmacy Antibiotic Note  Julie RODICK is a 82 y.o. female admitted on 04/05/2018 with pneumonia.  Pharmacy has been consulted for unasyn dosing.  Plan: Unasyn 1.5 gm IV q8 hours F/u renal function, cultures and clinical course  Height: 5\' 3"  (160 cm) Weight: 150 lb (68 kg) IBW/kg (Calculated) : 52.4  Temp (24hrs), Avg:98.5 F (36.9 C), Min:98.4 F (36.9 C), Max:98.6 F (37 C)  Recent Labs  Lab 04/01/18 1641 04/05/18 1550  WBC 18.3 Repeated and verified X2.* 15.9*  CREATININE  --  0.90    Estimated Creatinine Clearance: 46.1 mL/min (by C-G formula based on SCr of 0.9 mg/dL).    Allergies  Allergen Reactions  . Metaxalone Other (See Comments)    Unknown  . Morphine Other (See Comments)    Flushing, rash, itching  . Tape Other (See Comments)    Band aides, adhesive tape Redness and pulls skin off     Thank you for allowing pharmacy to be a part of this patient's care.  06/05/18 [place] 04/06/2018 1:27 AM

## 2018-04-07 ENCOUNTER — Observation Stay (HOSPITAL_COMMUNITY): Payer: Medicare Other | Admitting: Anesthesiology

## 2018-04-07 ENCOUNTER — Encounter (HOSPITAL_COMMUNITY): Payer: Self-pay | Admitting: *Deleted

## 2018-04-07 ENCOUNTER — Encounter (HOSPITAL_COMMUNITY)
Admission: EM | Disposition: A | Discharge status: Home / Self Care | Payer: Self-pay | Source: Home / Self Care | Attending: Family Medicine

## 2018-04-07 DIAGNOSIS — Z91048 Other nonmedicinal substance allergy status: Secondary | ICD-10-CM | POA: Diagnosis not present

## 2018-04-07 DIAGNOSIS — I10 Essential (primary) hypertension: Secondary | ICD-10-CM | POA: Diagnosis not present

## 2018-04-07 DIAGNOSIS — I2510 Atherosclerotic heart disease of native coronary artery without angina pectoris: Secondary | ICD-10-CM | POA: Diagnosis present

## 2018-04-07 DIAGNOSIS — K224 Dyskinesia of esophagus: Secondary | ICD-10-CM | POA: Diagnosis not present

## 2018-04-07 DIAGNOSIS — K920 Hematemesis: Secondary | ICD-10-CM | POA: Diagnosis not present

## 2018-04-07 DIAGNOSIS — R112 Nausea with vomiting, unspecified: Secondary | ICD-10-CM | POA: Diagnosis not present

## 2018-04-07 DIAGNOSIS — E785 Hyperlipidemia, unspecified: Secondary | ICD-10-CM | POA: Diagnosis present

## 2018-04-07 DIAGNOSIS — E876 Hypokalemia: Secondary | ICD-10-CM | POA: Diagnosis present

## 2018-04-07 DIAGNOSIS — Y838 Other surgical procedures as the cause of abnormal reaction of the patient, or of later complication, without mention of misadventure at the time of the procedure: Secondary | ICD-10-CM | POA: Diagnosis present

## 2018-04-07 DIAGNOSIS — F411 Generalized anxiety disorder: Secondary | ICD-10-CM | POA: Diagnosis present

## 2018-04-07 DIAGNOSIS — E440 Moderate protein-calorie malnutrition: Secondary | ICD-10-CM

## 2018-04-07 DIAGNOSIS — M797 Fibromyalgia: Secondary | ICD-10-CM | POA: Diagnosis present

## 2018-04-07 DIAGNOSIS — R32 Unspecified urinary incontinence: Secondary | ICD-10-CM | POA: Diagnosis present

## 2018-04-07 DIAGNOSIS — K449 Diaphragmatic hernia without obstruction or gangrene: Secondary | ICD-10-CM

## 2018-04-07 DIAGNOSIS — R634 Abnormal weight loss: Secondary | ICD-10-CM | POA: Diagnosis not present

## 2018-04-07 DIAGNOSIS — F329 Major depressive disorder, single episode, unspecified: Secondary | ICD-10-CM | POA: Diagnosis present

## 2018-04-07 DIAGNOSIS — R1013 Epigastric pain: Secondary | ICD-10-CM | POA: Diagnosis not present

## 2018-04-07 DIAGNOSIS — Z885 Allergy status to narcotic agent status: Secondary | ICD-10-CM | POA: Diagnosis not present

## 2018-04-07 DIAGNOSIS — D638 Anemia in other chronic diseases classified elsewhere: Secondary | ICD-10-CM | POA: Diagnosis present

## 2018-04-07 DIAGNOSIS — E039 Hypothyroidism, unspecified: Secondary | ICD-10-CM | POA: Diagnosis not present

## 2018-04-07 DIAGNOSIS — Z8673 Personal history of transient ischemic attack (TIA), and cerebral infarction without residual deficits: Secondary | ICD-10-CM | POA: Diagnosis not present

## 2018-04-07 DIAGNOSIS — R1310 Dysphagia, unspecified: Secondary | ICD-10-CM | POA: Diagnosis not present

## 2018-04-07 DIAGNOSIS — J690 Pneumonitis due to inhalation of food and vomit: Secondary | ICD-10-CM | POA: Diagnosis not present

## 2018-04-07 DIAGNOSIS — Z888 Allergy status to other drugs, medicaments and biological substances status: Secondary | ICD-10-CM | POA: Diagnosis not present

## 2018-04-07 DIAGNOSIS — K9189 Other postprocedural complications and disorders of digestive system: Secondary | ICD-10-CM | POA: Diagnosis not present

## 2018-04-07 DIAGNOSIS — M1990 Unspecified osteoarthritis, unspecified site: Secondary | ICD-10-CM | POA: Diagnosis present

## 2018-04-07 DIAGNOSIS — Z66 Do not resuscitate: Secondary | ICD-10-CM | POA: Diagnosis present

## 2018-04-07 DIAGNOSIS — M8580 Other specified disorders of bone density and structure, unspecified site: Secondary | ICD-10-CM | POA: Diagnosis present

## 2018-04-07 DIAGNOSIS — Q399 Congenital malformation of esophagus, unspecified: Secondary | ICD-10-CM

## 2018-04-07 DIAGNOSIS — K219 Gastro-esophageal reflux disease without esophagitis: Secondary | ICD-10-CM | POA: Diagnosis not present

## 2018-04-07 DIAGNOSIS — G8929 Other chronic pain: Secondary | ICD-10-CM | POA: Diagnosis present

## 2018-04-07 HISTORY — PX: ESOPHAGOGASTRODUODENOSCOPY (EGD) WITH PROPOFOL: SHX5813

## 2018-04-07 LAB — CBC
HEMATOCRIT: 35.8 % — AB (ref 36.0–46.0)
HEMOGLOBIN: 11 g/dL — AB (ref 12.0–15.0)
MCH: 27.7 pg (ref 26.0–34.0)
MCHC: 30.7 g/dL (ref 30.0–36.0)
MCV: 90.2 fL (ref 78.0–100.0)
Platelets: 370 10*3/uL (ref 150–400)
RBC: 3.97 MIL/uL (ref 3.87–5.11)
RDW: 13.1 % (ref 11.5–15.5)
WBC: 15.8 10*3/uL — AB (ref 4.0–10.5)

## 2018-04-07 LAB — BASIC METABOLIC PANEL
ANION GAP: 11 (ref 5–15)
BUN: 7 mg/dL (ref 6–20)
CHLORIDE: 104 mmol/L (ref 101–111)
CO2: 27 mmol/L (ref 22–32)
Calcium: 9 mg/dL (ref 8.9–10.3)
Creatinine, Ser: 0.81 mg/dL (ref 0.44–1.00)
Glucose, Bld: 111 mg/dL — ABNORMAL HIGH (ref 65–99)
POTASSIUM: 3.8 mmol/L (ref 3.5–5.1)
SODIUM: 142 mmol/L (ref 135–145)

## 2018-04-07 SURGERY — ESOPHAGOGASTRODUODENOSCOPY (EGD) WITH PROPOFOL
Anesthesia: Monitor Anesthesia Care

## 2018-04-07 MED ORDER — POTASSIUM CHLORIDE CRYS ER 20 MEQ PO TBCR: 40.0000 meq | EXTENDED_RELEASE_TABLET | Freq: Two times a day (BID) | ORAL | Status: DC

## 2018-04-07 MED ORDER — ENSURE ENLIVE PO LIQD
237.0000 mL | Freq: Two times a day (BID) | ORAL | Status: DC
  Administered 2018-04-09 (×2): 237 mL via ORAL

## 2018-04-07 MED ORDER — AMOXICILLIN-POT CLAVULANATE 875-125 MG PO TABS
1.0000 | ORAL_TABLET | Freq: Two times a day (BID) | ORAL | Status: DC
  Administered 2018-04-08: 1 via ORAL
  Filled 2018-04-07: qty 1

## 2018-04-07 MED ORDER — BOOST / RESOURCE BREEZE PO LIQD CUSTOM
1.0000 | Freq: Three times a day (TID) | ORAL | Status: DC
  Administered 2018-04-07: 1 via ORAL
  Filled 2018-04-07 (×2): qty 1

## 2018-04-07 MED ORDER — ADULT MULTIVITAMIN W/MINERALS CH
1.0000 | ORAL_TABLET | Freq: Every day | ORAL | Status: DC
  Administered 2018-04-07 – 2018-04-09 (×3): 1 via ORAL
  Filled 2018-04-07 (×3): qty 1

## 2018-04-07 MED ORDER — PROPOFOL 10 MG/ML IV BOLUS
INTRAVENOUS | Status: DC | PRN
  Administered 2018-04-07: 10 mg via INTRAVENOUS
  Administered 2018-04-07: 20 mg via INTRAVENOUS

## 2018-04-07 MED ORDER — PROPOFOL 500 MG/50ML IV EMUL
INTRAVENOUS | Status: DC | PRN
  Administered 2018-04-07: 75 ug/kg/min via INTRAVENOUS

## 2018-04-07 MED ORDER — AMLODIPINE BESYLATE 5 MG PO TABS
5.0000 mg | ORAL_TABLET | Freq: Every day | ORAL | Status: DC
  Administered 2018-04-08 – 2018-04-09 (×2): 5 mg via ORAL
  Filled 2018-04-07 (×2): qty 1

## 2018-04-07 MED ORDER — WHITE PETROLATUM EX OINT
TOPICAL_OINTMENT | CUTANEOUS | Status: AC
  Administered 2018-04-07: 0.2
  Filled 2018-04-07: qty 28.35

## 2018-04-07 MED ORDER — LACTATED RINGERS IV SOLN
INTRAVENOUS | Status: DC
  Administered 2018-04-07: 1000 mL via INTRAVENOUS

## 2018-04-07 SURGICAL SUPPLY — 15 items

## 2018-04-07 NOTE — Transfer of Care (Signed)
Immediate Anesthesia Transfer of Care Note  Patient: Julie Rice  Procedure(s) Performed: ESOPHAGOGASTRODUODENOSCOPY (EGD) WITH PROPOFOL (N/A )  Patient Location: Endoscopy Unit  Anesthesia Type:MAC  Level of Consciousness: awake, alert , oriented and patient cooperative  Airway & Oxygen Therapy: Patient Spontanous Breathing and Patient connected to nasal cannula oxygen  Post-op Assessment: Report given to RN, Post -op Vital signs reviewed and stable and Patient moving all extremities X 4  Post vital signs: Reviewed and stable  Last Vitals:  Vitals Value Taken Time  BP    Temp    Pulse    Resp    SpO2      Last Pain:  Vitals:   04/07/18 0830  TempSrc: Oral  PainSc: 0-No pain      Patients Stated Pain Goal: 3 (22/29/79 8921)  Complications: No apparent anesthesia complications

## 2018-04-07 NOTE — Interval H&P Note (Signed)
History and Physical Interval Note:  04/07/2018 9:04 AM  Julie Rice  has presented today for surgery, with the diagnosis of Black emesis.  Dysphagia.  The various methods of treatment have been discussed with the patient and family. After consideration of risks, benefits and other options for treatment, the patient has consented to  Procedure(s): ESOPHAGOGASTRODUODENOSCOPY (EGD) WITH PROPOFOL (N/A) as a surgical intervention .  The patient's history has been reviewed, patient examined, no change in status, stable for surgery.  I have reviewed the patient's chart and labs.  Questions were answered to the patient's satisfaction.     [name]

## 2018-04-07 NOTE — Progress Notes (Signed)
Physical Therapy Treatment Patient Details Name: Julie Rice MRN: [id number] DOB: Oct 06, 1936 Today's Date: 04/07/2018    History of Present Illness Skiler C Birge is a 82 y.o. female presenting with nausea and hematemesis. PMH is significant for COPD, esophageal stricture, positive hemoccult blood tests, hiatal hernia s/p application, HTN, HLD, internal hemorrhoids, recent pneumonia with abscess in Feb 2019     PT Comments    Pt in bed on arrival. Pt hesitant to participate in therapy due to fatigue from EGD this AM but agreeable with minimal encouragement from therapist and family/visitors. Pt demonstrated modified independence bed mobility. Supervision required for transfers and ambulation 250 feet with RW. Pt very talkative throughout ambulation/mobility. Pt reports fatigue with mild SOB noted after return to room. Pt positioned in recliner with feet elevated at end of session.    Follow Up Recommendations  No PT follow up     Equipment Recommendations  None recommended by PT    Recommendations for Other Services       Precautions / Restrictions Precautions Precautions: Fall    Mobility  Bed Mobility Overal bed mobility: Modified Independent Bed Mobility: Supine to Sit;Sit to Supine     Supine to sit: Modified independent (Device/Increase time) Sit to supine: Modified independent (Device/Increase time)   General bed mobility comments: increased time, +rail   Transfers Overall transfer level: Needs assistance Equipment used: Rolling walker (2 wheeled) Transfers: Sit to/from [employer] Sit to Stand: Supervision Stand pivot transfers: Supervision       General transfer comment: supervision for safety  Ambulation/Gait Ambulation/Gait assistance: Supervision Ambulation Distance (Feet): 250 Feet Assistive device: Rolling walker (2 wheeled) Gait Pattern/deviations: Step-through pattern;Trunk flexed Gait velocity: mildly decreased Gait velocity  interpretation: 1.31 - 2.62 ft/sec, indicative of limited community ambulator General Gait Details: cues for posture and to stay close to RW. No LOB. No physical assist.    Stairs             Wheelchair Mobility    Modified Rankin (Stroke Patients Only)       Balance Overall balance assessment: Mild deficits observed, not formally tested                                          Cognition Arousal/Alertness: Awake/alert Behavior During Therapy: WFL for tasks assessed/performed Overall Cognitive Status: Within Functional Limits for tasks assessed                                        Exercises      General Comments        Pertinent Vitals/Pain Pain Assessment: No/denies pain    Home Living                      Prior Function            PT Goals (current goals can now be found in the care plan section) Acute Rehab PT Goals Patient Stated Goal: get stronger PT Goal Formulation: With patient Time For Goal Achievement: 04/20/18 Potential to Achieve Goals: Good Progress towards PT goals: Progressing toward goals    Frequency    Min 3X/week      PT Plan Current plan remains appropriate    Co-evaluation  AM-PAC PT "6 Clicks" Daily Activity  Outcome Measure  Difficulty turning over in bed (including adjusting bedclothes, sheets and blankets)?: None Difficulty moving from lying on back to sitting on the side of the bed? : A Little Difficulty sitting down on and standing up from a chair with arms (e.g., wheelchair, bedside commode, etc,.)?: A Little Help needed moving to and from a bed to chair (including a wheelchair)?: None Help needed walking in hospital room?: None Help needed climbing 3-5 steps with a railing? : A Little 6 Click Score: 21    End of Session Equipment Utilized During Treatment: Gait belt Activity Tolerance: Patient tolerated treatment well Patient left: in chair;with  call bell/phone within reach;with family/visitor present Nurse Communication: Mobility status PT Visit Diagnosis: Unsteadiness on feet (R26.81);Muscle weakness (generalized) (M62.81)     Time: 3662-9476 PT Time Calculation (min) (ACUTE ONLY): 14 min  Charges:  $Gait Training: 8-22 mins                    G Codes:       Aida Raider, PT  Office # [phone] Pager [phone]    Ilda Foil 04/07/2018, 1:12 PM

## 2018-04-07 NOTE — Progress Notes (Signed)
PT Cancellation Note  Patient Details Name: Julie Rice MRN: [id number] DOB: [birth year]   Cancelled Treatment:    Reason Eval/Treat Not Completed: Patient at procedure or test/unavailable. Pt in endoscopy for EGD.   Ilda Foil 04/07/2018, 8:39 AM

## 2018-04-07 NOTE — Progress Notes (Signed)
Initial Nutrition Assessment  DOCUMENTATION CODES:   Not applicable  INTERVENTION:   -D/c Boost Breeze po TID, each supplement provides 250 kcal and 9 grams of protein -Ensure Enlive po BID, each supplement provides 350 kcal and 20 grams of protein -MVI with minerals daily  NUTRITION DIAGNOSIS:   Inadequate oral intake related to dysphagia, altered GI function as evidenced by meal completion < 25%, per patient/family report.  GOAL:   Patient will meet greater than or equal to 90% of their needs  MONITOR:   PO intake, Supplement acceptance, Labs, Weight trends, Skin, I & O's  REASON FOR ASSESSMENT:   Malnutrition Screening Tool    ASSESSMENT:   Julie Rice is a 82 y.o. female presenting with nausea and hematemesis. PMH is significant for COPD, esophageal stricture, positive hemoccult blood tests, hiatal hernia s/p application, HTN, HLD, internal hemorrhoids, recent pneumonia with abscess in Feb 2019   Pt admitted with aspiration pneumonia.   6/4- s/p EGD- significant findings were 360 fundoplication, medium sized hiatal hernia, widely patent Schatzki's ring, and tortuous esophagus.   Spoke with pt, who was sitting in recliner chair at time of visit. She reports she has a good appetite when in her usual state of healthy and consumes 3 meals per day. However, pt has experienced a general decline in health over the past 2 months- she shares that she now only consumes bites and sips of each meal, due to sensation of foods and liquids "getting stuck and getting brought back up" whenever she eats. She wants to eat, however, feels like the food is unable to pass her throat. She shares she was unable to keep water down in the ED.   Since EGD, pt reports problem seems to have improved. Pt was sipping on a Boost Breeze supplement without difficulty. Per her and family member's report, she consumed about 5 bites of mashed potatoes for lunch, which is vast improvement PTA.   Pt  reports UBW is around 160#. She estimates she has experienced a 17# wt loss over the past 2 months. Reviewed wt hx; noted pt has experienced a 5.7% wt loss over the past 3 months, which is not significant for time frame.   Discussed importance of good meal and supplement intake to promote healing. Pt amenable to try Ensure supplements now that diet is advanced.   Labs reviewed.   NUTRITION - FOCUSED PHYSICAL EXAM:    Most Recent Value  Orbital Region  No depletion  Upper Arm Region  Mild depletion  Thoracic and Lumbar Region  No depletion  Buccal Region  No depletion  Temple Region  Mild depletion  Clavicle Bone Region  No depletion  Clavicle and Acromion Bone Region  No depletion  Scapular Bone Region  No depletion  Dorsal Hand  Mild depletion  Patellar Region  No depletion  Anterior Thigh Region  No depletion  Posterior Calf Region  Mild depletion  Edema (RD Assessment)  None  Hair  Reviewed  Eyes  Reviewed  Mouth  Reviewed  Skin  Reviewed  Nails  Reviewed       Diet Order:   Diet Order           DIET SOFT Room service appropriate? Yes; Fluid consistency: Thin  Diet effective now          EDUCATION NEEDS:   Education needs have been addressed  Skin:  Skin Assessment: Reviewed RN Assessment  Last BM:  04/07/18  Height:   Ht Readings from  Last 1 Encounters:  04/06/18 5\' 3"  (1.6 m)    Weight:   Wt Readings from Last 1 Encounters:  04/06/18 150 lb (68 kg)    Ideal Body Weight:  52.3 kg  BMI:  Body mass index is 26.57 kg/m.  Estimated Nutritional Needs:   Kcal:  1500-1700  Protein:  75-90 grams  Fluid:  1.5-1.7 L    Julie Rice A. 06/06/18, RD, LDN, CDE Pager: [phone] After hours Pager: [phone]

## 2018-04-07 NOTE — Anesthesia Postprocedure Evaluation (Signed)
Anesthesia Post Note  Patient: Julie Rice  Procedure(s) Performed: ESOPHAGOGASTRODUODENOSCOPY (EGD) WITH PROPOFOL (N/A )     Patient location during evaluation: PACU Anesthesia Type: MAC Level of consciousness: awake and alert Pain management: pain level controlled Vital Signs Assessment: post-procedure vital signs reviewed and stable Respiratory status: spontaneous breathing, nonlabored ventilation, respiratory function stable and patient connected to nasal cannula oxygen Cardiovascular status: stable and blood pressure returned to baseline Postop Assessment: no apparent nausea or vomiting Anesthetic complications: no    Last Vitals:  Vitals:   04/07/18 0930 04/07/18 0940  BP: (!) 177/64 (!) 185/65  Pulse: 76 82  Resp: 19 (!) 23  Temp:    SpO2: 99% 97%    Last Pain:  Vitals:   04/07/18 0940  TempSrc:   PainSc: 0-No pain                 Justyne Roell

## 2018-04-07 NOTE — Anesthesia Preprocedure Evaluation (Signed)
Anesthesia Evaluation  Patient identified by MRN, date of birth, ID band Patient awake    Reviewed: Allergy & Precautions, NPO status , Patient's Chart, lab work & pertinent test results  History of Anesthesia Complications (+) PONV  Airway Mallampati: I  TM Distance: >3 FB Neck ROM: Full    Dental  (+) Teeth Intact, Dental Advisory Given   Pulmonary COPD,    breath sounds clear to auscultation       Cardiovascular hypertension, Pt. on medications + Valvular Problems/Murmurs MR and AI  Rhythm:Regular Rate:Normal     Neuro/Psych    GI/Hepatic hiatal hernia, GERD  ,  Endo/Other  Hypothyroidism   Renal/GU      Musculoskeletal   Abdominal   Peds  Hematology   Anesthesia Other Findings Mild AI and moderate MR with good LV function (2011).  Reproductive/Obstetrics                             Anesthesia Physical  Anesthesia Plan  ASA: III  Anesthesia Plan: MAC   Post-op Pain Management:    Induction: Intravenous  PONV Risk Score and Plan: 3 and Ondansetron and Treatment may vary due to age or medical condition  Airway Management Planned: Nasal Cannula, Natural Airway and Mask  Additional Equipment:   Intra-op Plan:   Post-operative Plan:   Informed Consent: I have reviewed the patients History and Physical, chart, labs and discussed the procedure including the risks, benefits and alternatives for the proposed anesthesia with the patient or authorized representative who has indicated his/her understanding and acceptance.   Dental advisory given  Plan Discussed with: CRNA, Surgeon and Anesthesiologist  Anesthesia Plan Comments:         Anesthesia Quick Evaluation

## 2018-04-07 NOTE — Progress Notes (Addendum)
Family Medicine Teaching Service Daily Progress Note Intern Pager: [phone]  Patient name: Julie Rice Medical record number: [id number] Date of birth: May 17, 1936 Age: 82 y.o. Gender: female  Primary Care Provider: Gordy Savers, MD Consultants: GI Code Status: DNR/DNI  Pt Overview and Major Events to Date:  Admitted on 04/05/18  Assessment and Plan:  Julie Rice is a 82 y.o. female presenting with nausea and hematemesis. PMH is significant for COPD, esophageal stricture, positivehemoccult blood tests, hiatalhernia s/papplication, HTN, HLD, internal hemorrhoids,recent pneumonia with abscess in Feb 2019   Aspiration pneumonia. CXR neg for infiltrate and patient afebrile and O2 sat in mid 90s on RA but CT abd showing clustered nodules in lung bases. Since patient also endorsing worsening SOB on ambulation over the last few days will cover for aspiration PNA with IV ABX as patient is not tolerating po.  WBC increased slightly over past 24 hours (18.3 on 5/29>15.9>13.4>15.8 on 6/4), possibly indicating that the infection is responding to current antibiotics. - monitor resp status - s/p azithromycin as outpt 5/31-6/1 - IV unasyn (6/3-6/4) - will transition to Augmentin on 6/5 - prn duonebs  Hematemesis. In the setting of recent endoscopy for esophageal stricture on 03/25/18. Patient has long standing history of esophageal strictures and hiatal hernia with multiple surgeries. CT abd showing increased size of mod hiatal hernia that likely could be cause of abdominal pain. Hgb at baseline 11.0 on 6/4 (baseline around 10.5-12). - GI recs: EGD unremarkable on 6/4, possible repeat manometry- soft diet - monitor Hgb daily - prn zofran - protonix 40mg  qd  HTN. BP 187/81 on night of 6/3, given labetalol 10 mg once, last BP 176/72 - increase Norvasc to 5 mg daily - continue losartan-HCTZ 100-25mg  qd.  GAD, MDD. - continue home klonopin 0.25mg  BID, zoloft 100mg  qd  Chronic  pain, osteoarthritis - continue home tramadol and gabapentin   FEN/GI: soft diet PPx: SCD  Disposition: continue inpatient stay until on PO abx and cleared by GI  Subjective:  Patient in endoscopy this morning.  Objective: Temp:  [98.1 F (36.7 C)-98.7 F (37.1 C)] 98.7 F (37.1 C) (06/04 0612) Pulse Rate:  [78-86] 84 (06/04 0612) Resp:  [17] 17 (06/04 0612) BP: (169-182)/(70-72) 176/72 (06/04 0612) SpO2:  [94 %-97 %] 97 % (06/04 0612) Physical Exam: Unable to examine; patient undergoing EGD  Laboratory: Recent Labs  Lab 04/01/18 1641 04/05/18 1550 04/06/18 0651  WBC 18.3 Repeated and verified X2.* 15.9* 13.4*  HGB 11.1* 10.6* 9.9*  HCT 34.1* 33.7* 31.6*  PLT 401.0* 419* 355   Recent Labs  Lab 04/05/18 1550 04/06/18 0651  NA 137 142  K 3.1* 2.9*  CL 99* 102  CO2 28 29  BUN 12 10  CREATININE 0.90 0.84  CALCIUM 8.6* 8.8*  PROT 6.7  --   BILITOT 0.5  --   ALKPHOS 70  --   ALT 12*  --   AST 18  --   GLUCOSE 97 83    Imaging/Diagnostic Tests: Dg Chest 2 View  Result Date: 04/05/2018 CLINICAL DATA:  82 year old female with history of nausea. Recent endoscopy procedure 1 week ago, with intermittent vomiting since that time. Black and dark colored emesis. Upper abdominal discomfort. EXAM: CHEST - 2 VIEW COMPARISON:  Chest x-ray 04/01/2018. FINDINGS: Lung volumes are normal. No consolidative airspace disease. No pleural effusions. Diffuse peribronchial cuffing and interstitial prominence, similar to the prior study, suggesting potential interstitial lung disease. No pleural effusions. Nodular density projecting over the right  upper lobe which corresponds to the right upper lobe pneumonia better demonstrated on prior chest x-ray 12/22/2017 and chest CT 12/25/2017, likely to reflect an area of post infectious or inflammatory scarring. No evidence of pulmonary edema. Heart size is normal. Upper mediastinal contours are within normal limits. Aortic atherosclerosis.  IMPRESSION: 1. No definite radiographic evidence of acute cardiopulmonary disease. 2. Ill-defined nodular density projecting over the right upper lobe which likely reflects post infectious or inflammatory scarring from prior pneumonia. However, this is more apparent than prior chest x-ray 04/01/2018. The possibility of repeat infection is not entirely excluded. Followup PA and lateral chest X-ray is recommended in 3-4 weeks to ensure resolution and exclude underlying malignancy. 3. Aortic atherosclerosis. Electronically Signed   By: Trudie Reed M.D.   On: 04/05/2018 16:50   Dg Chest 2 View  Result Date: 04/02/2018 CLINICAL DATA:  Cough, congestion EXAM: CHEST - 2 VIEW COMPARISON:  02/05/2018 FINDINGS: Diffuse interstitial prominence throughout the lungs. Mild hyperinflation. Heart is mildly enlarged. No effusions are acute bony abnormality. IMPRESSION: Mild interstitial prominence within the lungs with cardiomegaly. This could reflect early interstitial edema or chronic interstitial changes. Mild emphysema. Electronically Signed   By: Charlett Nose M.D.   On: 04/02/2018 08:15   Ct Abdomen Pelvis W Contrast  Result Date: 04/05/2018 CLINICAL DATA:  82 y/o F; black and dark colored emesis. Endoscopy procedure 1 week ago. History of esophageal banding procedure 1 year ago. EXAM: CT ABDOMEN AND PELVIS WITH CONTRAST TECHNIQUE: Multidetector CT imaging of the abdomen and pelvis was performed using the standard protocol following bolus administration of intravenous contrast. CONTRAST:  OMNIPAQUE IOHEXOL 300 MG/ML  SOLN COMPARISON:  08/05/2017 CT abdomen and pelvis. 03/24/2018 esophagram. FINDINGS: Lower chest: Increased size of moderate hiatal hernia. Postsurgical changes at the hilum of the diaphragm. Few clustered nodules at the lung bases in bronchovascular distribution. Hepatobiliary: No focal liver abnormality is seen. No gallstones, gallbladder wall thickening, or biliary dilatation. Pancreas:  Unremarkable. No pancreatic ductal dilatation or surrounding inflammatory changes. Spleen: Normal in size without focal abnormality. Adrenals/Urinary Tract: Adrenal glands are unremarkable. Kidneys are normal, without renal calculi, focal lesion, or hydronephrosis. Bladder is unremarkable. Stomach/Bowel: Stomach is within normal limits. Appendix appears normal. No evidence of bowel wall thickening, distention, or inflammatory changes. Mild sigmoid diverticulosis. No findings of acute diverticulitis. There is dense contrast within the distal colon. Vascular/Lymphatic: Aortic atherosclerosis. No enlarged abdominal or pelvic lymph nodes. Reproductive: Status post hysterectomy. No adnexal masses. Other: No abdominal wall hernia or abnormality. No abdominopelvic ascites. Musculoskeletal: No fracture is seen. L2-S1 posterior instrumented fusion and lateral arthrodesis. L4-5 laminectomy. Hardware appears intact and there is no apparent hardware related complication. No acute fracture identified. Moderate dextrocurvature of the thoracolumbar junction. Straightening of lumbar lordosis without listhesis. IMPRESSION: 1. Increased size of moderate hiatal hernia from 08/05/2017 partially visualized and extending above the field of view. 2. Aortic atherosclerosis. 3. Scattered sigmoid diverticulosis, no findings of acute diverticulitis. 4. Few clustered nodules in the lung bases in bronchovascular distribution, possibly aspiration pneumonitis given history of vomiting. Electronically Signed   By: Mitzi Hansen M.D.   On: 04/05/2018 19:07   Dg Esophagus  Result Date: 03/24/2018 CLINICAL DATA:  Nausea. History of hiatal hernia with repair infant application. Food sticking EXAM: ESOPHOGRAM / BARIUM SWALLOW / BARIUM TABLET STUDY TECHNIQUE: Combined double contrast and single contrast examination performed using effervescent crystals, thick barium liquid, and thin barium liquid. The patient was observed with fluoroscopy  swallowing a 13 mm  barium sulphate tablet. FLUOROSCOPY TIME:  Fluoroscopy Time:  1 minutes 1 second Radiation Exposure Index (if provided by the fluoroscopic device): 3.7 mGy Number of Acquired Spot Images: Chest CT 12/25/2017. Modified barium swallow 05/14/2016 COMPARISON:  05/14/2016 FINDINGS: Pharyngeal phase is negative for aspiration, laryngeal penetration, stasis, or diverticulum. Moderate hiatal hernia with supra adjacent smooth narrowing that is likely from persistent Nissen wrap based on abdominal CT 08/05/2017. No fixed stricture was encountered, barium tablet easily reached the stomach. Esophageal motility in the RAO semi recumbent position was unremarkable. IMPRESSION: 1. Moderate hiatal hernia with mild GE narrowing that is likely from persistent Nissen wrap. A 13 mm barium tablet easily reached the stomach. 2. Debris/contrast has been seen in the esophagus on chest CT from February 2019 and abdominal CT from 08/05/2017, but no specific dysmotility or stasis seen on this exam. Electronically Signed   By: Marnee Spring M.D.   On: 03/24/2018 12:03     Lennox Solders, MD 04/07/2018, 8:12 AM PGY-1, [facility] FPTS Intern pager: [phone], text pages welcome

## 2018-04-07 NOTE — Op Note (Signed)
[facility] Patient Name: Julie Rice Procedure Date : 04/07/2018 MRN: [id number] Attending MD: [name] , MD Date of Birth: 10/24/1936 CSN: [id number] Age: 82 Admit Type: Inpatient Procedure:                Upper GI endoscopy Indications:              Dysphagia, Suspected esophageal reflux,                            Coffee-ground emesis, Weight loss (over 15 pounds                            last few months) - recent limited esophageal                            manometry with 100% failed peristalsis Providers:                Sherilyn Cooter L. Myrtie Neither, MD, Clearnce Sorrel, RN, Kandice Robinsons, Technician Referring MD:              Medicines:                Monitored Anesthesia Care Complications:            No immediate complications. Estimated Blood Loss:     Estimated blood loss: none. Procedure:                Pre-Anesthesia Assessment:                           - Prior to the procedure, a History and Physical                            was performed, and patient medications and                            allergies were reviewed. The patient's tolerance of                            previous anesthesia was also reviewed. The risks                            and benefits of the procedure and the sedation                            options and risks were discussed with the patient.                            All questions were answered, and informed consent                            was obtained. Anticoagulants: The patient has taken  aspirin. It was decided not to withhold this                            medication prior to the procedure. ASA Grade                            Assessment: III - A patient with severe systemic                            disease. After reviewing the risks and benefits,                            the patient was deemed in satisfactory condition to                            undergo the  procedure.                           After obtaining informed consent, the endoscope was                            passed under direct vision. Throughout the                            procedure, the patient's blood pressure, pulse, and                            oxygen saturations were monitored continuously. The                            [id number] ([id number]) scope was introduced through the                            mouth, and advanced to the second part of duodenum.                            The upper GI endoscopy was accomplished without                            difficulty. The patient tolerated the procedure                            well. Scope In: Scope Out: Findings:      The distal esophagus was mildly tortuous.      A medium-sized hiatal hernia was present. There was a small amount of       liquid and food debris in the hernia and distal esophagus.      A widely patent Schatzki ring was found at the gastroesophageal junction.      Evidence of a 360 degree fundoplication was found in the cardia. The       wrap appeared intact. This was traversed without resistance.      The exam of the stomach was otherwise normal.      The examined duodenum was normal. Impression:               -  Tortuous esophagus.                           - Medium-sized hiatal hernia.                           - Widely patent Schatzki ring.                           - A 360 degree fundoplication was found. The wrap                            appears intact.                           - Normal examined duodenum.                           - No specimens collected.                           No cause of bleeding seen. The reported black                            vomitus may not have been blood. Recommendation:           - Soft diet.                           - Head of bed 30 elevated to at least 30 degrees at                            all times.                           - Consult with Dr. [name]  (primary GI) about                            possible repeat (endoscopy-guided) esophageal                            manometry and surgical consult to consider                            takedown/revision of fundoplication.                           - Protein calorie supplements Procedure Code(s):        --- Professional ---                           [phone], Esophagogastroduodenoscopy, flexible,                            transoral; diagnostic, including collection of                            specimen(s) by brushing or washing, when  performed                            (separate procedure) Diagnosis Code(s):        --- Professional ---                           Q39.9, Congenital malformation of esophagus,                            unspecified                           K44.9, Diaphragmatic hernia without obstruction or                            gangrene                           K22.2, Esophageal obstruction                           Z98.890, Other specified postprocedural states                           R13.10, Dysphagia, unspecified                           K92.0, Hematemesis                           R63.4, Abnormal weight loss CPT copyright 2017 [employer]. All rights reserved. The codes documented in this report are preliminary and upon coder review may  be revised to meet current compliance requirements. [name], MD 04/07/2018 9:32:28 AM This report has been signed electronically. Number of Addenda: 0

## 2018-04-07 NOTE — Progress Notes (Signed)
OT Cancellation Note  Patient Details Name: SHAMBHAVI SALLEY MRN: [id number] DOB: Jan 23, 1936   Cancelled Treatment:    Reason Eval/Treat Not Completed: Patient at procedure or test/ unavailable. Pt off unit for EGD. Will check back as able.   Doristine Section, MS OTR/L  Pager: [phone]   Miyoshi Ligas A Alyssia Heese 04/07/2018, 9:06 AM

## 2018-04-08 ENCOUNTER — Encounter (HOSPITAL_COMMUNITY): Payer: Self-pay | Admitting: Gastroenterology

## 2018-04-08 DIAGNOSIS — K224 Dyskinesia of esophagus: Secondary | ICD-10-CM

## 2018-04-08 DIAGNOSIS — R112 Nausea with vomiting, unspecified: Secondary | ICD-10-CM

## 2018-04-08 LAB — CBC
HCT: 31.3 % — ABNORMAL LOW (ref 36.0–46.0)
HEMOGLOBIN: 9.9 g/dL — AB (ref 12.0–15.0)
MCH: 28.2 pg (ref 26.0–34.0)
MCHC: 31.6 g/dL (ref 30.0–36.0)
MCV: 89.2 fL (ref 78.0–100.0)
Platelets: 310 10*3/uL (ref 150–400)
RBC: 3.51 MIL/uL — AB (ref 3.87–5.11)
RDW: 13.3 % (ref 11.5–15.5)
WBC: 12 10*3/uL — ABNORMAL HIGH (ref 4.0–10.5)

## 2018-04-08 LAB — BASIC METABOLIC PANEL
ANION GAP: 7 (ref 5–15)
BUN: 7 mg/dL (ref 6–20)
CHLORIDE: 107 mmol/L (ref 101–111)
CO2: 26 mmol/L (ref 22–32)
Calcium: 8.4 mg/dL — ABNORMAL LOW (ref 8.9–10.3)
Creatinine, Ser: 0.72 mg/dL (ref 0.44–1.00)
GFR calc non Af Amer: >60 mL/min (ref >60)
GLUCOSE: 112 mg/dL — AB (ref 65–99)
Potassium: 3.1 mmol/L — ABNORMAL LOW (ref 3.5–5.1)
Sodium: 140 mmol/L (ref 135–145)

## 2018-04-08 MED ORDER — ONDANSETRON HCL 4 MG/2ML IJ SOLN
4.0000 mg | Freq: Four times a day (QID) | INTRAMUSCULAR | Status: DC
  Administered 2018-04-08: 4 mg via INTRAVENOUS
  Filled 2018-04-08: qty 2

## 2018-04-08 MED ORDER — PROMETHAZINE HCL 25 MG/ML IJ SOLN
12.5000 mg | Freq: Four times a day (QID) | INTRAMUSCULAR | Status: DC | PRN
  Administered 2018-04-08: 12.5 mg via INTRAVENOUS
  Filled 2018-04-08: qty 1

## 2018-04-08 MED ORDER — ONDANSETRON HCL 4 MG/2ML IJ SOLN: 4.0000 mg | Freq: Three times a day (TID) | INTRAMUSCULAR | Status: DC

## 2018-04-08 MED ORDER — ONDANSETRON HCL 4 MG PO TABS
4.0000 mg | ORAL_TABLET | Freq: Three times a day (TID) | ORAL | Status: DC
  Filled 2018-04-08: qty 1

## 2018-04-08 MED ORDER — MIRTAZAPINE 15 MG PO TBDP
15.0000 mg | ORAL_TABLET | Freq: Every day | ORAL | Status: DC
  Administered 2018-04-08: 15 mg via ORAL
  Filled 2018-04-08: qty 1

## 2018-04-08 MED ORDER — ONDANSETRON HCL 4 MG PO TABS: 4.0000 mg | ORAL_TABLET | Freq: Three times a day (TID) | ORAL | Status: DC

## 2018-04-08 MED ORDER — AMOXICILLIN-POT CLAVULANATE 250-62.5 MG/5ML PO SUSR
500.0000 mg | Freq: Three times a day (TID) | ORAL | Status: DC
  Administered 2018-04-08 – 2018-04-09 (×4): 500 mg via ORAL
  Filled 2018-04-08 (×5): qty 10

## 2018-04-08 MED ORDER — PROMETHAZINE HCL 6.25 MG/5ML PO SYRP
12.5000 mg | ORAL_SOLUTION | Freq: Four times a day (QID) | ORAL | Status: DC | PRN
Start: 2018-04-08 — End: 2018-04-09
  Filled 2018-04-08: qty 10

## 2018-04-08 MED ORDER — ONDANSETRON HCL 4 MG/2ML IJ SOLN
4.0000 mg | Freq: Three times a day (TID) | INTRAMUSCULAR | Status: DC
  Administered 2018-04-08 – 2018-04-09 (×3): 4 mg via INTRAVENOUS
  Filled 2018-04-08 (×3): qty 2

## 2018-04-08 MED ORDER — ONDANSETRON HCL 4 MG PO TABS: 4.0000 mg | ORAL_TABLET | Freq: Four times a day (QID) | ORAL | Status: DC

## 2018-04-08 MED ORDER — KCL IN DEXTROSE-NACL 10-5-0.45 MEQ/L-%-% IV SOLN
INTRAVENOUS | Status: DC
  Administered 2018-04-08 – 2018-04-09 (×2): via INTRAVENOUS
  Filled 2018-04-08 (×3): qty 1000

## 2018-04-08 NOTE — Plan of Care (Signed)
  Problem: Clinical Measurements: Goal: Respiratory complications will improve Outcome: Progressing   Problem: Activity: Goal: Risk for activity intolerance will decrease Outcome: Progressing   Problem: Nutrition: Goal: Adequate nutrition will be maintained Outcome: Progressing   Problem: Elimination: Goal: Will not experience complications related to bowel motility Outcome: Progressing   

## 2018-04-08 NOTE — Progress Notes (Signed)
Family Medicine Teaching Service Daily Progress Note Intern Pager: [phone]  Patient name: Julie Rice Medical record number: [id number] Date of birth: Sep 11, 1936 Age: 82 y.o. Gender: female  Primary Care Provider: Gordy Savers, MD Consultants: GI Code Status: DNR/DNI  Pt Overview and Major Events to Date:  Admitted on 04/05/18  Assessment and Plan:  Julie Rice is a 82 y.o. female presenting with nausea and hematemesis. PMH is significant for COPD, esophageal stricture, positivehemoccult blood tests, hiatalhernia s/papplication, HTN, HLD, internal hemorrhoids,recent pneumonia with abscess in Feb 2019   Aspiration pneumonia. Improved. CXR neg for infiltrate and patient afebrile and O2 sat in mid 90s on RA but CT abd showing clustered nodules in lung bases. Transition to oral antibiotics today since patient is tolerating some po. WBC increased slightly over past 24 hours (18.3 on 5/29>15.9>13.4>15.8>12.0 on 6/5), possibly indicating that the infection is responding to current antibiotics. - Resp status (O2 sats at 93-99%) for past 24 hours - s/p azithromycin as outpt 5/31-6/1 - IV unasyn (6/3-6/4) - will transition to Augmentin today 6/5 for 14 day course total with scheduled end date of 6/14 - prn duonebs - ambulatory pulse ox  Hematemesis with N/V. In the setting of recent endoscopy for esophageal stricture on 03/25/18. Patient has long standing history of esophageal strictures and hiatal hernia with multiple surgeries. CT abd showing increased size of mod hiatal hernia that likely could be cause of abdominal pain. Hgb at baseline 11.0 on 6/4 (baseline around 10.5-12). - GI recs: EGD unremarkable on 6/4, possible repeat manometry outpatient - soft diet - remeron 15mg  dis - monitor Hgb daily - zofran scheduled - protonix 40mg  qd  Hypokalemia. Acute. 3.0 this am (6/5) - KCl with IV fluids - repeat BMP in am  HTN. SBP 147-188 and DBP 39-84 past 24hrs; last  BP 158/59 - cont Norvasc to 5 mg daily - continue losartan-HCTZ 100-25mg  qd.  GAD, MDD. - continue home klonopin 0.25mg  BID, zoloft 100mg  qd - remeron 15mg  at night daily  Chronic pain, osteoarthritis - continue home tramadol and gabapentin  FEN/GI: soft diet PPx: SCD  Disposition: continue inpatient stay until on PO abx and cleared by GI  Subjective:  Patient is feeling well but still having difficulty swallowing both liquids and solids with some regurgitation of contents. She overall feels improved and stated she would like to go home today if we cleared her. She still has mild cough which is somewhat improved. No other complaints.  Objective: Temp:  [97.8 F (36.6 C)-99.2 F (37.3 C)] 98.2 F (36.8 C) (06/05 0407) Pulse Rate:  [76-97] 83 (06/05 0407) Resp:  [15-23] 18 (06/05 0407) BP: (147-188)/(39-84) 158/59 (06/05 0407) SpO2:  [93 %-99 %] 96 % (06/05 0407) Physical Exam:  Gen: Alert and Oriented x 3, NAD HEENT: Normocephalic, atraumatic, PERRLA, EOMI CV: RRR, no murmurs, normal S1, S2 split Resp: decreased breath sounds diffusely, no wheezing, rales, or rhonchi, comfortable work of breathing Abd: non-distended, non-tender, soft, +bs in all four quadrants MSK: Moves all four extremities Ext: no clubbing, cyanosis, or edema Skin: warm, dry, intact, no rashes   Laboratory: Recent Labs  Lab 04/06/18 0651 04/07/18 1006 04/08/18 0546  WBC 13.4* 15.8* 12.0*  HGB 9.9* 11.0* 9.9*  HCT 31.6* 35.8* 31.3*  PLT 355 370 310   Recent Labs  Lab 04/05/18 1550 04/06/18 0651 04/07/18 1006 04/08/18 0546  NA 137 142 142 140  K 3.1* 2.9* 3.8 3.1*  CL 99* 102 104 107  CO2  28 29 27 26   BUN 12 10 7 7   CREATININE 0.90 0.84 0.81 0.72  CALCIUM 8.6* 8.8* 9.0 8.4*  PROT 6.7  --   --   --   BILITOT 0.5  --   --   --   ALKPHOS 70  --   --   --   ALT 12*  --   --   --   AST 18  --   --   --   GLUCOSE 97 83 111* 112*    Imaging/Diagnostic Tests: Dg Chest 2 View  Result  Date: 04/05/2018 CLINICAL DATA:  82 year old female with history of nausea. Recent endoscopy procedure 1 week ago, with intermittent vomiting since that time. Black and dark colored emesis. Upper abdominal discomfort. EXAM: CHEST - 2 VIEW COMPARISON:  Chest x-ray 04/01/2018. FINDINGS: Lung volumes are normal. No consolidative airspace disease. No pleural effusions. Diffuse peribronchial cuffing and interstitial prominence, similar to the prior study, suggesting potential interstitial lung disease. No pleural effusions. Nodular density projecting over the right upper lobe which corresponds to the right upper lobe pneumonia better demonstrated on prior chest x-ray 12/22/2017 and chest CT 12/25/2017, likely to reflect an area of post infectious or inflammatory scarring. No evidence of pulmonary edema. Heart size is normal. Upper mediastinal contours are within normal limits. Aortic atherosclerosis. IMPRESSION: 1. No definite radiographic evidence of acute cardiopulmonary disease. 2. Ill-defined nodular density projecting over the right upper lobe which likely reflects post infectious or inflammatory scarring from prior pneumonia. However, this is more apparent than prior chest x-ray 04/01/2018. The possibility of repeat infection is not entirely excluded. Followup PA and lateral chest X-ray is recommended in 3-4 weeks to ensure resolution and exclude underlying malignancy. 3. Aortic atherosclerosis. Electronically Signed   By: 12/27/2017 M.D.   On: 04/05/2018 16:50   Dg Chest 2 View  Result Date: 04/02/2018 CLINICAL DATA:  Cough, congestion EXAM: CHEST - 2 VIEW COMPARISON:  02/05/2018 FINDINGS: Diffuse interstitial prominence throughout the lungs. Mild hyperinflation. Heart is mildly enlarged. No effusions are acute bony abnormality. IMPRESSION: Mild interstitial prominence within the lungs with cardiomegaly. This could reflect early interstitial edema or chronic interstitial changes. Mild emphysema.  Electronically Signed   By: 04/04/2018 M.D.   On: 04/02/2018 08:15   Ct Abdomen Pelvis W Contrast  Result Date: 04/05/2018 CLINICAL DATA:  82 y/o F; black and dark colored emesis. Endoscopy procedure 1 week ago. History of esophageal banding procedure 1 year ago. EXAM: CT ABDOMEN AND PELVIS WITH CONTRAST TECHNIQUE: Multidetector CT imaging of the abdomen and pelvis was performed using the standard protocol following bolus administration of intravenous contrast. CONTRAST:  06/05/2018 OMNIPAQUE IOHEXOL 300 MG/ML  SOLN COMPARISON:  08/05/2017 CT abdomen and pelvis. 03/24/2018 esophagram. FINDINGS: Lower chest: Increased size of moderate hiatal hernia. Postsurgical changes at the hilum of the diaphragm. Few clustered nodules at the lung bases in bronchovascular distribution. Hepatobiliary: No focal liver abnormality is seen. No gallstones, gallbladder wall thickening, or biliary dilatation. Pancreas: Unremarkable. No pancreatic ductal dilatation or surrounding inflammatory changes. Spleen: Normal in size without focal abnormality. Adrenals/Urinary Tract: Adrenal glands are unremarkable. Kidneys are normal, without renal calculi, focal lesion, or hydronephrosis. Bladder is unremarkable. Stomach/Bowel: Stomach is within normal limits. Appendix appears normal. No evidence of bowel wall thickening, distention, or inflammatory changes. Mild sigmoid diverticulosis. No findings of acute diverticulitis. There is dense contrast within the distal colon. Vascular/Lymphatic: Aortic atherosclerosis. No enlarged abdominal or pelvic lymph nodes. Reproductive: Status post hysterectomy. No  adnexal masses. Other: No abdominal wall hernia or abnormality. No abdominopelvic ascites. Musculoskeletal: No fracture is seen. L2-S1 posterior instrumented fusion and lateral arthrodesis. L4-5 laminectomy. Hardware appears intact and there is no apparent hardware related complication. No acute fracture identified. Moderate dextrocurvature of the  thoracolumbar junction. Straightening of lumbar lordosis without listhesis. IMPRESSION: 1. Increased size of moderate hiatal hernia from 08/05/2017 partially visualized and extending above the field of view. 2. Aortic atherosclerosis. 3. Scattered sigmoid diverticulosis, no findings of acute diverticulitis. 4. Few clustered nodules in the lung bases in bronchovascular distribution, possibly aspiration pneumonitis given history of vomiting. Electronically Signed   By: Mitzi Hansen M.D.   On: 04/05/2018 19:07   Dg Esophagus  Result Date: 03/24/2018 CLINICAL DATA:  Nausea. History of hiatal hernia with repair infant application. Food sticking EXAM: ESOPHOGRAM / BARIUM SWALLOW / BARIUM TABLET STUDY TECHNIQUE: Combined double contrast and single contrast examination performed using effervescent crystals, thick barium liquid, and thin barium liquid. The patient was observed with fluoroscopy swallowing a 13 mm barium sulphate tablet. FLUOROSCOPY TIME:  Fluoroscopy Time:  1 minutes 1 second Radiation Exposure Index (if provided by the fluoroscopic device): 3.7 mGy Number of Acquired Spot Images: Chest CT 12/25/2017. Modified barium swallow 05/14/2016 COMPARISON:  05/14/2016 FINDINGS: Pharyngeal phase is negative for aspiration, laryngeal penetration, stasis, or diverticulum. Moderate hiatal hernia with supra adjacent smooth narrowing that is likely from persistent Nissen wrap based on abdominal CT 08/05/2017. No fixed stricture was encountered, barium tablet easily reached the stomach. Esophageal motility in the RAO semi recumbent position was unremarkable. IMPRESSION: 1. Moderate hiatal hernia with mild GE narrowing that is likely from persistent Nissen wrap. A 13 mm barium tablet easily reached the stomach. 2. Debris/contrast has been seen in the esophagus on chest CT from February 2019 and abdominal CT from 08/05/2017, but no specific dysmotility or stasis seen on this exam. Electronically Signed   By:  Marnee Spring M.D.   On: 03/24/2018 12:03     Arlyce Harman, DO 04/08/2018, 7:29 AM PGY-1, [facility] FPTS Intern pager: [phone], text pages welcome

## 2018-04-08 NOTE — Progress Notes (Signed)
SATURATION QUALIFICATIONS: (This note is used to comply with regulatory documentation for home oxygen)  Patient Saturations on Room Air at Rest = 94%  Patient Saturations on Room Air while Ambulating = 92%  Patient Saturations on   N/A Liters of oxygen while Ambulating = N/A  Please briefly explain why patient needs home oxygen: 

## 2018-04-08 NOTE — Progress Notes (Addendum)
Daily Rounding Note  04/08/2018, 11:02 AM  LOS: 1 day   SUBJECTIVE:   Chief complaint: Some nausea, vomiting of mashed potatoes last night.  Vomited orange juice this morning.  No abdominal pain.  Not urinating a lot.      OBJECTIVE:         Vital signs in last 24 hours:    Temp:  [98.2 F (36.8 C)-98.5 F (36.9 C)] 98.2 F (36.8 C) (06/05 0407) Pulse Rate:  [83-97] 83 (06/05 0407) Resp:  [15-18] 18 (06/05 0407) BP: (147-161)/(59-84) 158/59 (06/05 0407) SpO2:  [93 %-96 %] 96 % (06/05 0407) Last BM Date: 04/08/18 Filed Weights   04/05/18 1453 04/06/18 0112  Weight: 151 lb (68.5 kg) 150 lb (68 kg)   General: Somewhat frail appearing.  Looks better than a few days ago. Heart: RRR. Chest: Diminished breath sounds on left base.  No cough or labored breathing. Abdomen: Soft.  Not tender or distended.  Bowel sounds present, slightly hypoactive. Extremities: No CCE. Neuro/Psych: Calm.  Alert and oriented x3.  No obvious deficits.  Intake/Output from previous day: 06/04 0701 - 06/05 0700 In: 460 [P.O.:360; I.V.:100] Out: -   Intake/Output this shift: No intake/output data recorded.  Lab Results: Recent Labs    04/06/18 0651 04/07/18 1006 04/08/18 0546  WBC 13.4* 15.8* 12.0*  HGB 9.9* 11.0* 9.9*  HCT 31.6* 35.8* 31.3*  PLT 355 370 310   BMET Recent Labs    04/06/18 0651 04/07/18 1006 04/08/18 0546  NA 142 142 140  K 2.9* 3.8 3.1*  CL 102 104 107  CO2 29 27 26   GLUCOSE 83 111* 112*  BUN 10 7 7   CREATININE 0.84 0.81 0.72  CALCIUM 8.8* 9.0 8.4*   LFT Recent Labs    04/05/18 1550  PROT 6.7  ALBUMIN 3.4*  AST 18  ALT 12*  ALKPHOS 70  BILITOT 0.5   PT/INR No results for input(s): LABPROT, INR in the last 72 hours. Hepatitis Panel No results for input(s): HEPBSAG, HCVAB, HEPAIGM, HEPBIGM in the last 72 hours.  Studies/Results: No results found.   Scheduled Meds: . amLODipine  5 mg Oral  Daily  . amoxicillin-clavulanate  1 tablet Oral Q12H  . clonazePAM  0.25 mg Oral BID  . feeding supplement (ENSURE ENLIVE)  237 mL Oral BID BM  . gabapentin  100 mg Oral TID  . losartan  100 mg Oral Daily   And  . hydrochlorothiazide  25 mg Oral Daily  . multivitamin with minerals  1 tablet Oral Daily  . pantoprazole  40 mg Oral Daily  . sertraline  100 mg Oral QHS   Continuous Infusions: PRN Meds:.ipratropium-albuterol, ondansetron **OR** ondansetron (ZOFRAN) IV, traMADol   ASSESMENT:   *    Progressive dysphagia in patient S/P. Nissen fundoplication. Dark emesis acutely PTA. 04/07/2018 EGD.   No source for dark emesis.  Dr. 06/05/18 not convinced that the dark emesis represented blood.  Esophagus tortuous.  Medium sized HH.  Widely patent Schatzki ring.  Intact fundoplication wrap. Continues to have vomiting and some nausea.  *     Aspiration pneumonitis.  Now on Augmentin.  *   Normocytic anemia.  Has not required transfusion.  *    Hypokalemia.  PLAN   *   Since she is not taking much in the way of p.o., ordered D5 1/2 NS with 10 of potassium run at 75/hr.    *  Regress diet back to full liquids.  Continue twice daily Ensure between meals.  Continue once daily Protonix  *   Dr. Myrtie Neither discussed the patient's situation with Dr. Yancey Flemings, her primary GI MD.  At this point patient can discharge home.  Dr. [name] will likely be referring the patient to endoscopic and surgical specialists at [facility] in the near future.  Dr. Lamar Sprinkles office will be contacting the patient with any planned/arranged follow-up either with Dr. [name] or in [facility].     Jennye Moccasin  04/08/2018, 11:02 AM Phone [phone]   I have discussed the case with the PA, and that is the plan I formulated. I personally interviewed and examined the patient.  The patient has had a very difficult day, upset and confused about her plan and prognosis. I explained to her results thus far and plan for  further outpatient evaluation at tertiary care center.  Fleta was very upset when she was informed she would be discharged this evening.  This started her becoming nauseated and vomiting.  I feel it would be best for the patient to remain overnight to settle symptoms and calm her anxieties.Will attempt to reach medical house staff through nursing.    [name] Office: [phone]

## 2018-04-08 NOTE — Progress Notes (Signed)
Occupational Therapy Treatment Patient Details Name: Julie Rice MRN: [id number] DOB: 1936/01/20 Today's Date: 04/08/2018    History of present illness [name] is a 82 y.o. female presenting with nausea and hematemesis. PMH is significant for COPD, esophageal stricture, positive hemoccult blood tests, hiatal hernia s/p application, HTN, HLD, internal hemorrhoids, recent pneumonia with abscess in Feb 2019    OT comments  Pt limited by nausea. Has had medicine. Ambulated to bathroom, toileted and performed standing grooming with supervision. Limited tolerance of further activity due to nausea. Will continue to follow.  Follow Up Recommendations  Home health OT;Supervision/Assistance - 24 hour(initially)    Equipment Recommendations  None recommended by OT    Recommendations for Other Services      Precautions / Restrictions Precautions Precautions: Fall Restrictions Weight Bearing Restrictions: No       Mobility Bed Mobility               General bed mobility comments: pt in chair  Transfers Overall transfer level: Needs assistance Equipment used: None   Sit to Stand: Supervision         General transfer comment: supervision for safety    Balance                                           ADL either performed or assessed with clinical judgement   ADL Overall ADL's : Needs assistance/impaired     Grooming: Wash/dry hands;Standing;Supervision/safety           Upper Body Dressing : Set up;Sitting       Toilet Transfer: Supervision/safety;Ambulation   Toileting- Clothing Manipulation and Hygiene: Supervision/safety;Sit to/from stand       Functional mobility during ADLs: Supervision/safety General ADL Comments: close supervision for safety     Vision       Perception     Praxis      Cognition Arousal/Alertness: Awake/alert Behavior During Therapy: WFL for tasks assessed/performed Overall Cognitive Status:  Within Functional Limits for tasks assessed                                          Exercises     Shoulder Instructions       General Comments      Pertinent Vitals/ Pain       Pain Assessment: No/denies pain  Home Living                                          Prior Functioning/Environment              Frequency  Min 2X/week        Progress Toward Goals  OT Goals(current goals can now be found in the care plan section)  Progress towards OT goals: Progressing toward goals  Acute Rehab OT Goals Patient Stated Goal: get stronger OT Goal Formulation: With patient Time For Goal Achievement: 04/20/18 Potential to Achieve Goals: Good  Plan Discharge plan remains appropriate    Co-evaluation                 AM-PAC PT "6 Clicks" Daily Activity     Outcome Measure   Help from  another person eating meals?: None Help from another person taking care of personal grooming?: A Little Help from another person toileting, which includes using toliet, bedpan, or urinal?: A Little Help from another person bathing (including washing, rinsing, drying)?: A Little Help from another person to put on and taking off regular upper body clothing?: None Help from another person to put on and taking off regular lower body clothing?: A Little 6 Click Score: 20    End of Session Equipment Utilized During Treatment: Gait belt  OT Visit Diagnosis: Other abnormalities of gait and mobility (R26.89);Muscle weakness (generalized) (M62.81)   Activity Tolerance Patient tolerated treatment well   Patient Left in chair;with call bell/phone within reach;with family/visitor present   Nurse Communication          Time: 7412-8786 OT Time Calculation (min): 14 min  Charges: OT General Charges $OT Visit: 1 Visit OT Treatments $Self Care/Home Management : 8-22 mins  04/08/2018 Martie Round, OTR/L Pager: [phone] [place] 04/08/2018, 12:07 PM

## 2018-04-09 ENCOUNTER — Other Ambulatory Visit: Payer: Self-pay | Admitting: Internal Medicine

## 2018-04-09 LAB — CBC
HEMATOCRIT: 33.3 % — AB (ref 36.0–46.0)
HEMOGLOBIN: 10.5 g/dL — AB (ref 12.0–15.0)
MCH: 27.9 pg (ref 26.0–34.0)
MCHC: 31.5 g/dL (ref 30.0–36.0)
MCV: 88.6 fL (ref 78.0–100.0)
Platelets: 294 10*3/uL (ref 150–400)
RBC: 3.76 MIL/uL — AB (ref 3.87–5.11)
RDW: 13.3 % (ref 11.5–15.5)
WBC: 8.7 10*3/uL (ref 4.0–10.5)

## 2018-04-09 LAB — BASIC METABOLIC PANEL
ANION GAP: 10 (ref 5–15)
BUN: <5 mg/dL — ABNORMAL LOW (ref 6–20)
CHLORIDE: 104 mmol/L (ref 101–111)
CO2: 28 mmol/L (ref 22–32)
Calcium: 8.7 mg/dL — ABNORMAL LOW (ref 8.9–10.3)
Creatinine, Ser: 0.74 mg/dL (ref 0.44–1.00)
GFR calc non Af Amer: >60 mL/min (ref >60)
GLUCOSE: 96 mg/dL (ref 65–99)
Potassium: 2.8 mmol/L — ABNORMAL LOW (ref 3.5–5.1)
Sodium: 142 mmol/L (ref 135–145)

## 2018-04-09 LAB — MAGNESIUM: MAGNESIUM: 1.9 mg/dL (ref 1.7–2.4)

## 2018-04-09 MED ORDER — POTASSIUM CHLORIDE 20 MEQ/15ML (10%) PO SOLN
40.0000 meq | Freq: Every day | ORAL | Status: AC
  Administered 2018-04-09: 40 meq via ORAL
  Filled 2018-04-09: qty 30

## 2018-04-09 MED ORDER — LOSARTAN POTASSIUM 100 MG PO TABS: 100.0000 mg | ORAL_TABLET | Freq: Every day | ORAL | 0 refills | Status: DC

## 2018-04-09 MED ORDER — ENSURE ENLIVE PO LIQD: 237.0000 mL | Freq: Two times a day (BID) | ORAL | 12 refills | Status: DC

## 2018-04-09 MED ORDER — AMLODIPINE BESYLATE 10 MG PO TABS: 10.0000 mg | ORAL_TABLET | Freq: Every day | ORAL | Status: DC

## 2018-04-09 MED ORDER — AMOXICILLIN-POT CLAVULANATE 250-62.5 MG/5ML PO SUSR: 500.0000 mg | Freq: Three times a day (TID) | ORAL | 0 refills | Status: AC

## 2018-04-09 MED ORDER — AMLODIPINE BESYLATE 5 MG PO TABS
5.0000 mg | ORAL_TABLET | ORAL | Status: AC
  Administered 2018-04-09: 5 mg via ORAL
  Filled 2018-04-09: qty 1

## 2018-04-09 MED ORDER — POTASSIUM CHLORIDE 20 MEQ/15ML (10%) PO SOLN: 40.0000 meq | Freq: Every day | ORAL | Status: DC

## 2018-04-09 MED ORDER — PANTOPRAZOLE SODIUM 40 MG PO TBEC: 40.0000 mg | DELAYED_RELEASE_TABLET | Freq: Every day | ORAL | 0 refills | Status: DC

## 2018-04-09 MED ORDER — AMLODIPINE BESYLATE 10 MG PO TABS: 10.0000 mg | ORAL_TABLET | Freq: Every day | ORAL | 0 refills | Status: DC

## 2018-04-09 MED ORDER — POTASSIUM CITRATE-CITRIC ACID 1100-334 MG/5ML PO SOLN: 10.0000 meq | Freq: Three times a day (TID) | ORAL | Status: DC

## 2018-04-09 MED ORDER — MIRTAZAPINE 15 MG PO TBDP: 15.0000 mg | ORAL_TABLET | Freq: Every day | ORAL | 0 refills | Status: DC

## 2018-04-09 NOTE — Progress Notes (Addendum)
Family Medicine Teaching Service Daily Progress Note Intern Pager: [phone]  Patient name: Julie Rice Medical record number: [id number] Date of birth: 29-May-1936 Age: 82 y.o. Gender: female  Primary Care Provider: Gordy Savers, MD Consultants: GI Code Status: DNR/DNI  Pt Overview and Major Events to Date:  Admitted on 04/05/18  Assessment and Plan:  NYONNA STANFILL is a 82 y.o. female presenting with nausea and hematemesis. PMH is significant for COPD, esophageal stricture, positivehemoccult blood tests, hiatalhernia s/papplication, HTN, HLD, internal hemorrhoids,recent pneumonia with abscess in Feb 2019   Aspiration pneumonia. Improved. CXR neg for infiltrate and patient afebrile and O2 sat in mid 90s on RA but CT abd showing clustered nodules in lung bases. Transition to oral antibiotics today since patient is tolerating some po. WBC increased slightly over past 24 hours (18.3 on 5/29>15.9>13.4>15.8>12.0 on 6/5), possibly indicating that the infection is responding to current antibiotics. - Resp status (O2 sats at 93-99%) for past 24 hours; amb pulse ox  - s/p azithromycin as outpt 5/31-6/1 - IV unasyn (6/3-6/4) - Cont Augmentin today (6/5-) for 14 day course total with scheduled end date of 6/14 - prn duonebs - ambulatory pulse ox  Hematemesis with N/V. In the setting of recent endoscopy for esophageal stricture on 03/25/18. Patient has long standing history of esophageal strictures and hiatal hernia with multiple surgeries. CT abd showing increased size of mod hiatal hernia that likely could be cause of abdominal pain. Hgb at baseline 11.0 on 6/4 (baseline around 10.5-12). - GI recs: safe to discharge on liquid diet and they are arranging close follow up with her outpatient GI doctor - monitor Hgb daily - zofran scheduled - protonix 40mg  qd  Hypokalemia. Acute. 2.8 this am (6/6) - KCl with IV fluids - Give Potassium chloride once  HTN. SBP 158-165  and DBP 59-79 past 24hrs; last BP 165/69. Suspect recent life stressors and current N/V are contributing to rise in BP. - increase Norvasc to 10 mg daily - discontinue losartan-HCTZ 100-25mg  qd.  GAD, MDD. - continue home klonopin 0.25mg  BID, zoloft 100mg  qd - remeron 15mg  at night daily  Chronic pain, osteoarthritis - continue home tramadol and gabapentin  FEN/GI: soft diet PPx: SCD  Disposition: discharge today  Subjective:  Patient is feeling okay but was a little anxious about being told she was being discharged yesterday. Explained that we would not force her out and only wanted to explain that the rest of her work up for nausea and vomiting would be done outpatient and no further work up needs to be done while she is here in the hospital. Patient states she is tolerating some liquids but not solids.  Objective: Temp:  [97.7 F (36.5 C)-98.8 F (37.1 C)] 97.7 F (36.5 C) (06/06 0610) Pulse Rate:  [70-79] 78 (06/06 0610) Resp:  [17-18] 17 (06/06 0610) BP: (160-165)/(69-79) 165/69 (06/06 0610) SpO2:  [97 %-99 %] 98 % (06/06 0610) Physical Exam:  Gen: Alert and Oriented x 3, NAD HEENT: Normocephalic, atraumatic, PERRLA, EOMI CV: RRR, systolic murmur, normal S1, S2 split Resp: CTAB, no wheezing, rales, or rhonchi, comfortable work of breathing Abd: non-distended, non-tender, soft, +bs in all four quadrants, MSK: Moves all four extremities Ext: no clubbing, cyanosis, or trace edema Skin: warm, dry, intact, no rashes   Laboratory: Recent Labs  Lab 04/07/18 1006 04/08/18 0546 04/09/18 0726  WBC 15.8* 12.0* 8.7  HGB 11.0* 9.9* 10.5*  HCT 35.8* 31.3* 33.3*  PLT 370 310 294  Recent Labs  Lab 04/05/18 1550  04/07/18 1006 04/08/18 0546 04/09/18 0726  NA 137   < > 142 140 142  K 3.1*   < > 3.8 3.1* 2.8*  CL 99*   < > 104 107 104  CO2 28   < > 27 26 28   BUN 12   < > 7 7 <5*  CREATININE 0.90   < > 0.81 0.72 0.74  CALCIUM 8.6*   < > 9.0 8.4* 8.7*  PROT 6.7  --    --   --   --   BILITOT 0.5  --   --   --   --   ALKPHOS 70  --   --   --   --   ALT 12*  --   --   --   --   AST 18  --   --   --   --   GLUCOSE 97   < > 111* 112* 96   < > = values in this interval not displayed.    Imaging/Diagnostic Tests: Dg Chest 2 View  Result Date: 04/05/2018 CLINICAL DATA:  82 year old female with history of nausea. Recent endoscopy procedure 1 week ago, with intermittent vomiting since that time. Black and dark colored emesis. Upper abdominal discomfort. EXAM: CHEST - 2 VIEW COMPARISON:  Chest x-ray 04/01/2018. FINDINGS: Lung volumes are normal. No consolidative airspace disease. No pleural effusions. Diffuse peribronchial cuffing and interstitial prominence, similar to the prior study, suggesting potential interstitial lung disease. No pleural effusions. Nodular density projecting over the right upper lobe which corresponds to the right upper lobe pneumonia better demonstrated on prior chest x-ray 12/22/2017 and chest CT 12/25/2017, likely to reflect an area of post infectious or inflammatory scarring. No evidence of pulmonary edema. Heart size is normal. Upper mediastinal contours are within normal limits. Aortic atherosclerosis. IMPRESSION: 1. No definite radiographic evidence of acute cardiopulmonary disease. 2. Ill-defined nodular density projecting over the right upper lobe which likely reflects post infectious or inflammatory scarring from prior pneumonia. However, this is more apparent than prior chest x-ray 04/01/2018. The possibility of repeat infection is not entirely excluded. Followup PA and lateral chest X-ray is recommended in 3-4 weeks to ensure resolution and exclude underlying malignancy. 3. Aortic atherosclerosis. Electronically Signed   By: 04/03/2018 M.D.   On: 04/05/2018 16:50   Dg Chest 2 View  Result Date: 04/02/2018 CLINICAL DATA:  Cough, congestion EXAM: CHEST - 2 VIEW COMPARISON:  02/05/2018 FINDINGS: Diffuse interstitial prominence throughout  the lungs. Mild hyperinflation. Heart is mildly enlarged. No effusions are acute bony abnormality. IMPRESSION: Mild interstitial prominence within the lungs with cardiomegaly. This could reflect early interstitial edema or chronic interstitial changes. Mild emphysema. Electronically Signed   By: 04/07/2018 M.D.   On: 04/02/2018 08:15   Ct Abdomen Pelvis W Contrast  Result Date: 04/05/2018 CLINICAL DATA:  82 y/o F; black and dark colored emesis. Endoscopy procedure 1 week ago. History of esophageal banding procedure 1 year ago. EXAM: CT ABDOMEN AND PELVIS WITH CONTRAST TECHNIQUE: Multidetector CT imaging of the abdomen and pelvis was performed using the standard protocol following bolus administration of intravenous contrast. CONTRAST:  96 OMNIPAQUE IOHEXOL 300 MG/ML  SOLN COMPARISON:  08/05/2017 CT abdomen and pelvis. 03/24/2018 esophagram. FINDINGS: Lower chest: Increased size of moderate hiatal hernia. Postsurgical changes at the hilum of the diaphragm. Few clustered nodules at the lung bases in bronchovascular distribution. Hepatobiliary: No focal liver abnormality is seen. No gallstones, gallbladder  wall thickening, or biliary dilatation. Pancreas: Unremarkable. No pancreatic ductal dilatation or surrounding inflammatory changes. Spleen: Normal in size without focal abnormality. Adrenals/Urinary Tract: Adrenal glands are unremarkable. Kidneys are normal, without renal calculi, focal lesion, or hydronephrosis. Bladder is unremarkable. Stomach/Bowel: Stomach is within normal limits. Appendix appears normal. No evidence of bowel wall thickening, distention, or inflammatory changes. Mild sigmoid diverticulosis. No findings of acute diverticulitis. There is dense contrast within the distal colon. Vascular/Lymphatic: Aortic atherosclerosis. No enlarged abdominal or pelvic lymph nodes. Reproductive: Status post hysterectomy. No adnexal masses. Other: No abdominal wall hernia or abnormality. No abdominopelvic  ascites. Musculoskeletal: No fracture is seen. L2-S1 posterior instrumented fusion and lateral arthrodesis. L4-5 laminectomy. Hardware appears intact and there is no apparent hardware related complication. No acute fracture identified. Moderate dextrocurvature of the thoracolumbar junction. Straightening of lumbar lordosis without listhesis. IMPRESSION: 1. Increased size of moderate hiatal hernia from 08/05/2017 partially visualized and extending above the field of view. 2. Aortic atherosclerosis. 3. Scattered sigmoid diverticulosis, no findings of acute diverticulitis. 4. Few clustered nodules in the lung bases in bronchovascular distribution, possibly aspiration pneumonitis given history of vomiting. Electronically Signed   By: Mitzi Hansen M.D.   On: 04/05/2018 19:07   Dg Esophagus  Result Date: 03/24/2018 CLINICAL DATA:  Nausea. History of hiatal hernia with repair infant application. Food sticking EXAM: ESOPHOGRAM / BARIUM SWALLOW / BARIUM TABLET STUDY TECHNIQUE: Combined double contrast and single contrast examination performed using effervescent crystals, thick barium liquid, and thin barium liquid. The patient was observed with fluoroscopy swallowing a 13 mm barium sulphate tablet. FLUOROSCOPY TIME:  Fluoroscopy Time:  1 minutes 1 second Radiation Exposure Index (if provided by the fluoroscopic device): 3.7 mGy Number of Acquired Spot Images: Chest CT 12/25/2017. Modified barium swallow 05/14/2016 COMPARISON:  05/14/2016 FINDINGS: Pharyngeal phase is negative for aspiration, laryngeal penetration, stasis, or diverticulum. Moderate hiatal hernia with supra adjacent smooth narrowing that is likely from persistent Nissen wrap based on abdominal CT 08/05/2017. No fixed stricture was encountered, barium tablet easily reached the stomach. Esophageal motility in the RAO semi recumbent position was unremarkable. IMPRESSION: 1. Moderate hiatal hernia with mild GE narrowing that is likely from  persistent Nissen wrap. A 13 mm barium tablet easily reached the stomach. 2. Debris/contrast has been seen in the esophagus on chest CT from February 2019 and abdominal CT from 08/05/2017, but no specific dysmotility or stasis seen on this exam. Electronically Signed   By: Marnee Spring M.D.   On: 03/24/2018 12:03     Arlyce Harman, DO 04/09/2018, 7:21 AM PGY-1, [place] [facility] FPTS Intern pager: [phone], text pages welcome

## 2018-04-09 NOTE — Progress Notes (Signed)
Patient discharged to home. Verbalizes understanding of all discharge instructions including discharge medications and follow up MD visits.  

## 2018-04-09 NOTE — Discharge Summary (Signed)
Family Medicine Teaching Procedure [facility] Discharge Summary  Patient name: Julie Rice Medical record number: [id number] Date of birth: [birth year] Age: 82 y.o. Gender: female Date of Admission: 04/05/2018  Date of Discharge: 04/09/2018 Admitting Physician: Doreene Eland, MD  Primary Care Provider: Gordy Savers, MD Consultants: GI  Indication for Hospitalization:   Aspiration Pneumonia  Discharge Diagnoses/Problem List:   Aspiration Pneumonia Hematemesis  Hypokalemia HTN GAD MDD Osteoarthitis  Disposition: home  Discharge Condition: medically stable  Discharge Exam:   Gen: Alert and Oriented x 3, NAD HEENT: Normocephalic, atraumatic, PERRLA, EOMI CV: RRR, systolic murmur, normal S1, S2 split Resp: CTAB, no wheezing, rales, or rhonchi, comfortable work of breathing Abd: non-distended, non-tender, soft, +bs in all four quadrants, MSK: Moves all four extremities Ext: no clubbing, cyanosis, or trace edema Skin: warm, dry, intact, no rashes  Brief Hospital Course:  Julie C Cofferis a 82 y.o.femalewith PMH significant forCOPD, esophageal stricture, positivehemoccult blood tests, hiatalhernia s/papplication,HTN,HLD, internal hemorrhoids,recentpneumoniawith abscess in Feb 2019who presented with nausea and vomiting. During her hospital admission she was found to have an elevated WBC, with some increased work of breathing. CXR was significant for nodule in the right upper lobe. Given her risk of aspiration and her recent nausea and vomiting she was treated for aspiration pneumonia and she improved with IV antibiotics and we were able to transition to oral Augmentin for a total of 14 days of treatment. She improved on Augmentin.   She was also seen and evaluated by GI due to her nausea and vomiting and they recommended follow up with her primary GI doctor, Dr. Yancey Flemings. GI also recommended and helped coordinate her being set up for an evaluation with an  endoscopic and surgical specialist at [facility]. Given her age, history of esophogeal strictures with continued n/v s/p nissen fundoplication it is unclear what the next best step will be in relieving her nausea and vomiting.  Her symptoms of nausea and vomiting did improve with eating smaller meals at a slower rate more frequently each day. We also discussed staying upright when eating and not laying down after meals.  She was found to have hypokalemia. She was given of KCl solution on day of discharge in addition to of KCl in her IV fluids.   Issues for Follow Up:  1. She was started on Augmentin for her aspiration PNA. Please ensure she finishes the full 14 day course of antibiotics. 2. During her hospitalization she was started on Norvasc and it was increased to 10mg  to get better blood pressure control. Please follow up to ensure her BP remains below goal and well controlled. 3. Her nausea and vomiting was evaluated by the Gastroenterology team here at the hospital and the cause remains unclear. It was recommended that she see a GI specialist at [facility] to better assess what steps can be taken to help improve her symptoms. 4. Please get a BMP to follow up and ensure her hypokalemia has resolved. 5. Follow up CXR in 3-4 weeks is recommended to confirm resolution of her presumed aspiration PNA.  Significant Procedures:  None  Significant Labs and Imaging:  Recent Labs  Lab 04/07/18 1006 04/08/18 0546 04/09/18 0726  WBC 15.8* 12.0* 8.7  HGB 11.0* 9.9* 10.5*  HCT 35.8* 31.3* 33.3*  PLT 370 310 294   Recent Labs  Lab 04/05/18 1550 04/06/18 0651 04/07/18 1006 04/08/18 0546 04/09/18 0726  NA 137 142 142 140 142  K 3.1* 2.9* 3.8 3.1* 2.8*  CL 99* 102 104 107 104  CO2 28 29 27 26 28   GLUCOSE 97 83 111* 112* 96  BUN 12 10 7 7  <5*  CREATININE 0.90 0.84 0.81 0.72 0.74  CALCIUM 8.6* 8.8* 9.0 8.4* 8.7*  MG  --   --   --   --  1.9  ALKPHOS 70  --   --   --   --   AST 18  --   --    --   --   ALT 12*  --   --   --   --   ALBUMIN 3.4*  --   --   --   --    CT Abd/Pelvis IMPRESSION: 1. Increased size of moderate hiatal hernia from 08/05/2017 partially visualized and extending above the field of view. 2. Aortic atherosclerosis. 3. Scattered sigmoid diverticulosis, no findings of acute diverticulitis. 4. Few clustered nodules in the lung bases in bronchovascular distribution, possibly aspiration pneumonitis given history of Vomiting.  CXR IMPRESSION: 1. No definite radiographic evidence of acute cardiopulmonary disease. 2. Ill-defined nodular density projecting over the right upper lobe which likely reflects post infectious or inflammatory scarring from prior pneumonia. However, this is more apparent than prior chest x-ray 04/01/2018. The possibility of repeat infection is not entirely excluded. Followup PA and lateral chest X-ray is recommended in 3-4 weeks to ensure resolution and exclude underlying malignancy. 3. Aortic atherosclerosis.   Results/Tests Pending at Time of Discharge: None  Discharge Medications:  Allergies as of 04/09/2018      Reactions   Metaxalone Other (See Comments)   Unknown   Morphine Other (See Comments)   Flushing, rash, itching   Tape Other (See Comments)   Band aides, adhesive tape Redness and pulls skin off      Medication List    STOP taking these medications   azithromycin 250 MG tablet Commonly known as:  ZITHROMAX   losartan-hydrochlorothiazide 100-25 MG tablet Commonly known as:  HYZAAR     TAKE these medications   amLODipine 10 MG tablet Commonly known as:  NORVASC Take 1 tablet (10 mg total) by mouth daily. Start taking on:  04/10/2018 What changed:    medication strength  how much to take   amoxicillin-clavulanate 250-62.5 MG/5ML suspension Commonly known as:  AUGMENTIN Take 10 mLs (500 mg total) by mouth every 8 (eight) hours for 8 days.   aspirin 81 MG tablet Take 1 tablet (81 mg total) by  mouth daily. For cardiovascular health.   clonazePAM 0.5 MG tablet Commonly known as:  KLONOPIN TAKE 1 TABLET BY MOUTH 3 TIMES A DAY AS NEEDED   feeding supplement (ENSURE ENLIVE) Liqd Take 237 mLs by mouth 2 (two) times daily between meals. Start taking on:  04/10/2018   gabapentin 100 MG capsule Commonly known as:  NEURONTIN TAKE 1 CAPSULE BY MOUTH THREE TIMES A DAY   losartan 100 MG tablet Commonly known as:  COZAAR Take 1 tablet (100 mg total) by mouth daily. Start taking on:  04/10/2018   meloxicam 15 MG tablet Commonly known as:  MOBIC Take 15 mg by mouth daily.   mirtazapine 15 MG disintegrating tablet Commonly known as:  REMERON SOL-TAB Take 1 tablet (15 mg total) by mouth at bedtime.   multivitamin tablet Take 1 tablet by mouth daily. Vitamin supplement.   ondansetron 4 MG tablet Commonly known as:  ZOFRAN Take one tablet every 4-6 hours as needed for nausea   pantoprazole 40 MG tablet Commonly known as:  PROTONIX TAKE 1 TABLET BY MOUTH 3O TO 60 MIN BEFORE YOUR FIRST AND LAST MEALS OF THE DAY What changed:  Another medication with the same name was changed. Make sure you understand how and when to take each.   pantoprazole 40 MG tablet Commonly known as:  PROTONIX Take 1 tablet (40 mg total) by mouth daily. Start taking on:  04/10/2018 What changed:    medication strength  how much to take   sertraline 100 MG tablet Commonly known as:  ZOLOFT TAKE 1 TABLET BY MOUTH EVERY DAY   traMADol 50 MG tablet Commonly known as:  ULTRAM Take 1-2 tablets (50-100 mg total) by mouth every 4 (four) hours as needed for moderate pain.       Discharge Instructions: Please refer to Patient Instructions section of EMR for full details.  Patient was counseled important signs and symptoms that should prompt return to medical care, changes in medications, dietary instructions, activity restrictions, and follow up appointments.   Follow-Up Appointments:   Arlyce Harman,  DO 04/10/2018, 3:15 PM PGY-1, [facility]

## 2018-04-09 NOTE — Plan of Care (Signed)
  Problem: Activity: Goal: Risk for activity intolerance will decrease Outcome: Progressing   Problem: Nutrition: Goal: Adequate nutrition will be maintained Outcome: Progressing   

## 2018-04-09 NOTE — Discharge Instructions (Signed)
You were hospitalized for aspiration pneumonia. You were treated with antibiotics and it is important to continue the full course of antibiotics and take them as directed.   Please make sure you follow up with your Gastroenterologist who is helping arrange an appointment for you to be seen by a GI specialist at [facility] for further evaluation of your nausea and vomiting.

## 2018-04-10 ENCOUNTER — Telehealth: Payer: Self-pay | Admitting: *Deleted

## 2018-04-10 ENCOUNTER — Other Ambulatory Visit: Payer: Self-pay | Admitting: Internal Medicine

## 2018-04-10 NOTE — Telephone Encounter (Signed)
Transition Care Management Follow-up Telephone Call  Per Discharge Summary:  Date of Admission: 04/05/2018                        Date of Discharge: 04/09/2018 Admitting Physician: Doreene Eland, MD  Primary Care Provider: Gordy Savers, MD Consultants: GI  Indication for Hospitalization:   Aspiration Pneumonia  Discharge Diagnoses/Problem List:   Aspiration Pneumonia Hematemesis  Hypokalemia HTN GAD MDD Osteoarthitis  Disposition: home  Discharge Condition: medically stable  --   How have you been since you were released from the hospital? "Not that good because they told me to stay on soups and borth. At lunch I had broth and took a swallow and it came right back up and came out my nose. Last night I slept better but today I just don't have an appetite and I'm weak." Patient reports she does feel much better than when she went in the hospital.   Do you understand why you were in the hospital? yes   Do you understand the discharge instructions? yes   Where were you discharged to? Home   Items Reviewed:  Medications reviewed: yes  Allergies reviewed: yes  Dietary changes reviewed: yes  Referrals reviewed: yes   Functional Questionnaire:   Activities of Daily Living (ADLs):   She states they are independent in the following: ambulation, bathing and hygiene, feeding, continence, grooming, toileting and dressing States they require assistance with the following: none   Any transportation issues/concerns?: no   Any patient concerns? yes, patient wants to ensure that she all the referrals and specialist appts that she needs.   Confirmed importance and date/time of follow-up visits scheduled yes  Provider Appointment booked with Dr. Eleonore Chiquito 04/13/2018 @ 3:45pm  Confirmed with patient if condition begins to worsen call PCP or go to the ER.  Patient was given the office number and encouraged to call back with question or concerns.  :  yes

## 2018-04-13 ENCOUNTER — Ambulatory Visit: Payer: Medicare Other | Admitting: Internal Medicine

## 2018-04-13 ENCOUNTER — Encounter: Payer: Self-pay | Admitting: Internal Medicine

## 2018-04-13 VITALS — BP 140/68 | HR 83 | Temp 98.0°F | Wt 130.0 lb

## 2018-04-13 DIAGNOSIS — E876 Hypokalemia: Secondary | ICD-10-CM | POA: Diagnosis not present

## 2018-04-13 DIAGNOSIS — I10 Essential (primary) hypertension: Secondary | ICD-10-CM | POA: Diagnosis not present

## 2018-04-13 DIAGNOSIS — K224 Dyskinesia of esophagus: Secondary | ICD-10-CM

## 2018-04-13 DIAGNOSIS — M150 Primary generalized (osteo)arthritis: Secondary | ICD-10-CM

## 2018-04-13 DIAGNOSIS — M8949 Other hypertrophic osteoarthropathy, multiple sites: Secondary | ICD-10-CM

## 2018-04-13 DIAGNOSIS — M159 Polyosteoarthritis, unspecified: Secondary | ICD-10-CM

## 2018-04-13 NOTE — Patient Instructions (Signed)
Limit your sodium (Salt) intake  Slowly resume your usual level of activity  Take your antibiotic as prescribed until ALL of it is gone, but stop if you develop a rash, swelling, or any side effects of the medication.  Contact our office as soon as possible if  there are side effects of the medication.   GI follow-up as scheduled   Return in 1 month for follow-up

## 2018-04-13 NOTE — Progress Notes (Signed)
Subjective:    Patient ID: Julie Rice, female    DOB: Oct 22, 1936, 82 y.o.   MRN: [id number]  HPI  82 year old patient who is seen today following a recent hospital discharge and for transitional care management   Hospital records reviewed   Hospital Discharge Summary  Patient name: Julie Rice        Medical record number: [id number] Date of birth: 05-06-1936          Age: 82 y.o.    Gender: female Date of Admission: 04/05/2018                        Date of Discharge: 04/09/2018 Admitting Physician: Doreene Eland, MD  Primary Care Provider: Gordy Savers, MD Consultants: GI  Indication for Hospitalization:   Aspiration Pneumonia  Discharge Diagnoses/Problem List:   Aspiration Pneumonia Hematemesis  Hypokalemia HTN GAD MDD Osteoarthitis  Since her hospital discharge she has done quite well.  Her cough and shortness of breath have largely resolved.  She is completing 14 total days of Augmentin. She is scheduled for GI and pulmonary follow-up.  She states that she is being considered for consultation at [facility].  Hospital course comp gated by hypokalemia and diuretic therapy has been discontinued She seems to be tolerating her modified diet  Past Medical History:  Diagnosis Date  . Anemia   . Anxiety   . Bipolar disorder (HCC)   . Blood transfusion 1991   autologous pts own blood given   . CAD (coronary artery disease)   . Chest pain    "@ rest, lying down, w/exertion"  . Chronic back pain    "mostly lower back but I do have upper back pain regularly" (04/06/2018)  . COPD (chronic obstructive pulmonary disease) (HCC)   . Depression   . Esophageal stricture   . Fecal occult blood test positive   . Fibromyalgia   . GERD (gastroesophageal reflux disease)   . Heart murmur    "slight" (04/06/2018)  . Hiatal hernia   . Hyperlipidemia   . Hypertension   . Internal hemorrhoids   . Lumbago   . Mitral regurgitation   . Osteoarthritis   .  Pneumonia ~ 01/2018  . PONV (postoperative nausea and vomiting)    severe ponv, "in the past" (04/06/2018)  . Rectal bleeding   . Rheumatoid arthritis (HCC)   . Spondylosis   . TIA (transient ischemic attack) 2013  . Urinary incontinence    wears depends     Social History   Socioeconomic History  . Marital status: Widowed    Spouse name: Not on file  . Number of children: 2 D  . Years of education: Not on file  . Highest education level: Not on file  Occupational History    Employer: RETIRED  Social Needs  . Financial resource strain: Not on file  . Food insecurity:    Worry: Not on file    Inability: Not on file  . Transportation needs:    Medical: Not on file    Non-medical: Not on file  Tobacco Use  . Smoking status: Never Smoker  . Smokeless tobacco: Never Used  Substance and Sexual Activity  . Alcohol use: Never    Frequency: Never  . Drug use: Never  . Sexual activity: Not Currently  Lifestyle  . Physical activity:    Days per week: Not on file    Minutes per session: Not on file  .  Stress: Not on file  Relationships  . Social connections:    Talks on phone: Not on file    Gets together: Not on file    Attends religious service: Not on file    Active member of club or organization: Not on file    Attends meetings of clubs or organizations: Not on file    Relationship status: Not on file  . Intimate partner violence:    Fear of current or ex partner: Not on file    Emotionally abused: Not on file    Physically abused: Not on file    Forced sexual activity: Not on file  Other Topics Concern  . Not on file  Social History Narrative  . Not on file    Past Surgical History:  Procedure Laterality Date  . ABDOMINAL HYSTERECTOMY  1982  . BACK SURGERY    . BLADDER SUSPENSION  1980's  . CATARACT EXTRACTION W/ INTRAOCULAR LENS  IMPLANT, BILATERAL Bilateral 2010  . DILATION AND CURETTAGE OF UTERUS  1961  . ESOPHAGEAL MANOMETRY N/A 03/25/2018   Procedure:  ESOPHAGEAL MANOMETRY (EM);  Surgeon: Napoleon Form, MD;  Location: [facility];  Service: Endoscopy;  Laterality: N/A;  . ESOPHAGOGASTRODUODENOSCOPY (EGD) WITH PROPOFOL N/A 04/07/2018   Procedure: ESOPHAGOGASTRODUODENOSCOPY (EGD) WITH PROPOFOL;  Surgeon: Sherrilyn Rist, MD;  Location: [facility] ENDOSCOPY;  Service: Gastroenterology;  Laterality: N/A;  . HERNIA REPAIR    . HIATAL HERNIA REPAIR N/A 08/02/2016   Procedure: LAPAROSCOPIC REPAIR OF LARGE  HIATAL HERNIA;  Surgeon: Glenna Fellows, MD;  Location: [facility];  Service: General;  Laterality: N/A;  . JOINT REPLACEMENT    . LAPAROSCOPIC NISSEN FUNDOPLICATION N/A 08/02/2016   Procedure: LAPAROSCOPIC NISSEN FUNDOPLICATION;  Surgeon: Glenna Fellows, MD;  Location: [facility];  Service: General;  Laterality: N/A;  . LUMBAR LAMINECTOMY  1990; 1994; [id number]   "I've got 2 stainless steel rods; 6 screws; 2 ray cages"took bone from right hip to put in back  . TONSILLECTOMY AND ADENOIDECTOMY  1945  . TOTAL KNEE ARTHROPLASTY Right ~ 1996  . TUBAL LIGATION  ~ 1976    Family History  Problem Relation Age of Onset  . Kidney disease Mother   . Hypertension Mother   . Heart disease Father 33       MI  . Suicidality Son   . Colon cancer Neg Hx   . Esophageal cancer Neg Hx   . Pancreatic cancer Neg Hx     Allergies  Allergen Reactions  . Metaxalone Other (See Comments)    Unknown  . Morphine Other (See Comments)    Flushing, rash, itching  . Tape Other (See Comments)    Band aides, adhesive tape Redness and pulls skin off    Current Outpatient Medications on File Prior to Visit  Medication Sig Dispense Refill  . amLODipine (NORVASC) 10 MG tablet Take 1 tablet (10 mg total) by mouth daily. 30 tablet 0  . amoxicillin-clavulanate (AUGMENTIN) 250-62.5 MG/5ML suspension Take 10 mLs (500 mg total) by mouth every 8 (eight) hours for 8 days. 150 mL 0  . aspirin 81 MG tablet Take 1 tablet (81 mg total) by mouth daily. For cardiovascular health.     . clonazePAM (KLONOPIN) 0.5 MG tablet TAKE 1 TABLET BY MOUTH 3 TIMES A DAY AS NEEDED 270 tablet 0  . feeding supplement, ENSURE ENLIVE, (ENSURE ENLIVE) LIQD Take 237 mLs by mouth 2 (two) times daily between meals. 237 mL 12  . gabapentin (NEURONTIN) 100 MG  capsule TAKE 1 CAPSULE BY MOUTH THREE TIMES A DAY 270 capsule 0  . losartan (COZAAR) 100 MG tablet Take 1 tablet (100 mg total) by mouth daily. 30 tablet 0  . mirtazapine (REMERON SOL-TAB) 15 MG disintegrating tablet Take 1 tablet (15 mg total) by mouth at bedtime. 30 tablet 0  . Multiple Vitamin (MULTIVITAMIN) tablet Take 1 tablet by mouth daily. Vitamin supplement.    . ondansetron (ZOFRAN) 4 MG tablet Take one tablet every 4-6 hours as needed for nausea 60 tablet 3  . pantoprazole (PROTONIX) 40 MG tablet TAKE 1 TABLET BY MOUTH 3O TO 60 MIN BEFORE YOUR FIRST AND LAST MEALS OF THE DAY 60 tablet 2  . sertraline (ZOLOFT) 100 MG tablet TAKE 1 TABLET BY MOUTH EVERY DAY 90 tablet 0   No current facility-administered medications on file prior to visit.     BP 140/68 (BP Location: Right Arm, Patient Position: Sitting, Cuff Size: Large)   Pulse 83   Temp 98 F (36.7 C) (Oral)   Wt 130 lb (59 kg)   SpO2 98%   BMI 23.03 kg/m      Review of Systems  Constitutional: Negative.   HENT: Negative for congestion, dental problem, hearing loss, rhinorrhea, sinus pressure, sore throat and tinnitus.   Eyes: Negative for pain, discharge and visual disturbance.  Respiratory: Positive for cough. Negative for shortness of breath.   Cardiovascular: Negative for chest pain, palpitations and leg swelling.  Gastrointestinal: Positive for nausea. Negative for abdominal distention, abdominal pain, blood in stool, constipation, diarrhea and vomiting.  Genitourinary: Negative for difficulty urinating, dysuria, flank pain, frequency, hematuria, pelvic pain, urgency, vaginal bleeding, vaginal discharge and vaginal pain.  Musculoskeletal: Negative for arthralgias,  gait problem and joint swelling.  Skin: Negative for rash.  Neurological: Negative for dizziness, syncope, speech difficulty, weakness, numbness and headaches.  Hematological: Negative for adenopathy.  Psychiatric/Behavioral: Negative for agitation, behavioral problems and dysphoric mood. The patient is not nervous/anxious.        Objective:   Physical Exam  Constitutional: She is oriented to person, place, and time. She appears well-developed and well-nourished. No distress.    No distress afebrile O2 saturation 98  HENT:  Head: Normocephalic.  Right Ear: External ear normal.  Left Ear: External ear normal.  Mouth/Throat: Oropharynx is clear and moist.  Eyes: Pupils are equal, round, and reactive to light. Conjunctivae and EOM are normal.  Neck: Normal range of motion. Neck supple. No thyromegaly present.  Cardiovascular: Normal rate, regular rhythm, normal heart sounds and intact distal pulses.  Pulmonary/Chest: Effort normal. She has rales.  Still with fairly extensive rales left greater than right  Abdominal: Soft. Bowel sounds are normal. She exhibits no mass. There is no tenderness.  Musculoskeletal: Normal range of motion.  Lymphadenopathy:    She has no cervical adenopathy.  Neurological: She is alert and oriented to person, place, and time.  Skin: Skin is warm and dry. No rash noted.  Psychiatric: She has a normal mood and affect. Her behavior is normal.          Assessment & Plan:   Resolving aspiration pneumonia.  Complete antibiotic therapy follow-up pulmonary medicine Esophageal mobility disorder follow-up GI Essential hypertension well-controlled.  Continue present regimen.  Continue to hold diuretic therapy Osteoarthritis.  Continue to hold anti-inflammatory medications Chronic low back pain  Follow-up 1 month  Gordy Savers

## 2018-04-13 NOTE — Progress Notes (Signed)
   Subjective:    Patient ID: Julie Rice, female    DOB: Jul 20, 1936, 82 y.o.   MRN: [id number]  HPI  Family Medicine Teaching [facility] Discharge Summary  Patient name: Julie Rice        Medical record number: [id number] Date of birth: 07-10-1936          Age: 82 y.o.    Gender: female Date of Admission: 04/05/2018                        Date of Discharge: 04/09/2018 Admitting Physician: Doreene Eland, MD  Primary Care Provider: Gordy Savers, MD Consultants: GI  Indication for Hospitalization:   Aspiration Pneumonia  Discharge Diagnoses/Problem List:   Aspiration Pneumonia Hematemesis  Hypokalemia HTN GAD MDD Osteoarthitis    Review of Systems     Objective:   Physical Exam        Assessment & Plan:

## 2018-04-14 LAB — CBC WITH DIFFERENTIAL/PLATELET
BASOS ABS: 0 10*3/uL (ref 0.0–0.1)
Basophils Relative: 0.4 % (ref 0.0–3.0)
EOS ABS: 0.5 10*3/uL (ref 0.0–0.7)
Eosinophils Relative: 5.5 % — ABNORMAL HIGH (ref 0.0–5.0)
HCT: 34.3 % — ABNORMAL LOW (ref 36.0–46.0)
Hemoglobin: 11.3 g/dL — ABNORMAL LOW (ref 12.0–15.0)
LYMPHS ABS: 1.6 10*3/uL (ref 0.7–4.0)
Lymphocytes Relative: 19 % (ref 12.0–46.0)
MCHC: 33.1 g/dL (ref 30.0–36.0)
MCV: 86 fl (ref 78.0–100.0)
MONO ABS: 0.5 10*3/uL (ref 0.1–1.0)
Monocytes Relative: 5.6 % (ref 3.0–12.0)
NEUTROS PCT: 69.5 % (ref 43.0–77.0)
Neutro Abs: 5.9 10*3/uL (ref 1.4–7.7)
Platelets: 378 10*3/uL (ref 150.0–400.0)
RBC: 3.99 Mil/uL (ref 3.87–5.11)
RDW: 14 % (ref 11.5–15.5)
WBC: 8.5 10*3/uL (ref 4.0–10.5)

## 2018-04-14 LAB — BASIC METABOLIC PANEL
BUN: 17 mg/dL (ref 6–23)
CALCIUM: 9.4 mg/dL (ref 8.4–10.5)
CHLORIDE: 101 meq/L (ref 96–112)
CO2: 31 mEq/L (ref 19–32)
CREATININE: 0.83 mg/dL (ref 0.40–1.20)
GFR: 70.26 mL/min (ref >60.00)
GLUCOSE: 92 mg/dL (ref 70–99)
Potassium: 4.4 mEq/L (ref 3.5–5.1)
SODIUM: 141 meq/L (ref 135–145)

## 2018-04-15 ENCOUNTER — Telehealth: Payer: Self-pay

## 2018-04-15 NOTE — Telephone Encounter (Signed)
I need to discuss things with her in the office. We will go from there. Thanks

## 2018-04-15 NOTE — Telephone Encounter (Signed)
Pt calling stating she was supposed to be referred to [facility] and has not heard anything regarding this appt. Pt is scheduled to see Dr. [name] 05/19/18@1 :45pm. Please advise regarding [facility] referral, I did not see anything regarding this referral.

## 2018-04-15 NOTE — Telephone Encounter (Signed)
Spoke with pt and she is aware and knows to keep her appt with Dr. [name].

## 2018-04-27 ENCOUNTER — Encounter: Payer: Self-pay | Admitting: Internal Medicine

## 2018-05-01 NOTE — Telephone Encounter (Signed)
E-mail was reviewed by Dr.Fry. He advise me to tell the pt to come in due to concerns of the swelling that he believed may be caused by the Norvasc. Spoke to pt and she stated that she does not have a way to the office today but can find one on Monday. Pt verbalized understanding an apt was made and pt was informed to go to the ER if anything changes over the weekend. No further action needed at the moment.

## 2018-05-03 ENCOUNTER — Other Ambulatory Visit: Payer: Self-pay | Admitting: Family Medicine

## 2018-05-04 ENCOUNTER — Ambulatory Visit: Payer: Medicare Other | Admitting: Internal Medicine

## 2018-05-04 ENCOUNTER — Encounter: Payer: Self-pay | Admitting: Internal Medicine

## 2018-05-04 VITALS — BP 120/70 | HR 78 | Temp 98.4°F | Wt 148.0 lb

## 2018-05-04 DIAGNOSIS — K449 Diaphragmatic hernia without obstruction or gangrene: Secondary | ICD-10-CM | POA: Diagnosis not present

## 2018-05-04 DIAGNOSIS — K224 Dyskinesia of esophagus: Secondary | ICD-10-CM | POA: Diagnosis not present

## 2018-05-04 DIAGNOSIS — I10 Essential (primary) hypertension: Secondary | ICD-10-CM

## 2018-05-04 MED ORDER — AMLODIPINE BESYLATE 5 MG PO TABS: 5.0000 mg | ORAL_TABLET | Freq: Every day | ORAL | 3 refills | Status: DC

## 2018-05-04 NOTE — Progress Notes (Signed)
Subjective:    Patient ID: Julie Rice, female    DOB: 11-16-1935, 82 y.o.   MRN: [id number]  HPI  82 year old patient who is seen today for follow-up of hypertension. She was placed on amlodipine 10 mg last month during a recent hospital admission.  Today she complains of worsening pedal edema.  She is scheduled for pulmonary and GI follow-up soon  Past Medical History:  Diagnosis Date  . Anemia   . Anxiety   . Bipolar disorder (HCC)   . Blood transfusion 1991   autologous pts own blood given   . CAD (coronary artery disease)   . Chest pain    "@ rest, lying down, w/exertion"  . Chronic back pain    "mostly lower back but I do have upper back pain regularly" (04/06/2018)  . COPD (chronic obstructive pulmonary disease) (HCC)   . Depression   . Esophageal stricture   . Fecal occult blood test positive   . Fibromyalgia   . GERD (gastroesophageal reflux disease)   . Heart murmur    "slight" (04/06/2018)  . Hiatal hernia   . Hyperlipidemia   . Hypertension   . Internal hemorrhoids   . Lumbago   . Mitral regurgitation   . Osteoarthritis   . Pneumonia ~ 01/2018  . PONV (postoperative nausea and vomiting)    severe ponv, "in the past" (04/06/2018)  . Rectal bleeding   . Rheumatoid arthritis (HCC)   . Spondylosis   . TIA (transient ischemic attack) 2013  . Urinary incontinence    wears depends     Social History   Socioeconomic History  . Marital status: Widowed    Spouse name: Not on file  . Number of children: 2 D  . Years of education: Not on file  . Highest education level: Not on file  Occupational History    Employer: RETIRED  Social Needs  . Financial resource strain: Not on file  . Food insecurity:    Worry: Not on file    Inability: Not on file  . Transportation needs:    Medical: Not on file    Non-medical: Not on file  Tobacco Use  . Smoking status: Never Smoker  . Smokeless tobacco: Never Used  Substance and Sexual Activity  . Alcohol use:  Never    Frequency: Never  . Drug use: Never  . Sexual activity: Not Currently  Lifestyle  . Physical activity:    Days per week: Not on file    Minutes per session: Not on file  . Stress: Not on file  Relationships  . Social connections:    Talks on phone: Not on file    Gets together: Not on file    Attends religious service: Not on file    Active member of club or organization: Not on file    Attends meetings of clubs or organizations: Not on file    Relationship status: Not on file  . Intimate partner violence:    Fear of current or ex partner: Not on file    Emotionally abused: Not on file    Physically abused: Not on file    Forced sexual activity: Not on file  Other Topics Concern  . Not on file  Social History Narrative  . Not on file    Past Surgical History:  Procedure Laterality Date  . ABDOMINAL HYSTERECTOMY  1982  . BACK SURGERY    . BLADDER SUSPENSION  1980's  . CATARACT EXTRACTION W/ INTRAOCULAR  LENS  IMPLANT, BILATERAL Bilateral 2010  . DILATION AND CURETTAGE OF UTERUS  1961  . ESOPHAGEAL MANOMETRY N/A 03/25/2018   Procedure: ESOPHAGEAL MANOMETRY (EM);  Surgeon: Napoleon Form, MD;  Location: [facility];  Service: Endoscopy;  Laterality: N/A;  . ESOPHAGOGASTRODUODENOSCOPY (EGD) WITH PROPOFOL N/A 04/07/2018   Procedure: ESOPHAGOGASTRODUODENOSCOPY (EGD) WITH PROPOFOL;  Surgeon: Sherrilyn Rist, MD;  Location: [facility] ENDOSCOPY;  Service: Gastroenterology;  Laterality: N/A;  . HERNIA REPAIR    . HIATAL HERNIA REPAIR N/A 08/02/2016   Procedure: LAPAROSCOPIC REPAIR OF LARGE  HIATAL HERNIA;  Surgeon: Glenna Fellows, MD;  Location: [facility];  Service: General;  Laterality: N/A;  . JOINT REPLACEMENT    . LAPAROSCOPIC NISSEN FUNDOPLICATION N/A 08/02/2016   Procedure: LAPAROSCOPIC NISSEN FUNDOPLICATION;  Surgeon: Glenna Fellows, MD;  Location: [facility];  Service: General;  Laterality: N/A;  . LUMBAR LAMINECTOMY  1990; 1994; [id number]   "I've got 2 stainless steel  rods; 6 screws; 2 ray cages"took bone from right hip to put in back  . TONSILLECTOMY AND ADENOIDECTOMY  1945  . TOTAL KNEE ARTHROPLASTY Right ~ 1996  . TUBAL LIGATION  ~ 1976    Family History  Problem Relation Age of Onset  . Kidney disease Mother   . Hypertension Mother   . Heart disease Father 27       MI  . Suicidality Son   . Colon cancer Neg Hx   . Esophageal cancer Neg Hx   . Pancreatic cancer Neg Hx     Allergies  Allergen Reactions  . Metaxalone Other (See Comments)    Unknown  . Morphine Other (See Comments)    Flushing, rash, itching  . Tape Other (See Comments)    Band aides, adhesive tape Redness and pulls skin off    Current Outpatient Medications on File Prior to Visit  Medication Sig Dispense Refill  . aspirin 81 MG tablet Take 1 tablet (81 mg total) by mouth daily. For cardiovascular health.    . clonazePAM (KLONOPIN) 0.5 MG tablet TAKE 1 TABLET BY MOUTH 3 TIMES A DAY AS NEEDED 270 tablet 0  . feeding supplement, ENSURE ENLIVE, (ENSURE ENLIVE) LIQD Take 237 mLs by mouth 2 (two) times daily between meals. 237 mL 12  . gabapentin (NEURONTIN) 100 MG capsule TAKE 1 CAPSULE BY MOUTH THREE TIMES A DAY 270 capsule 0  . losartan (COZAAR) 100 MG tablet TAKE 1 TABLET BY MOUTH EVERY DAY 30 tablet 0  . mirtazapine (REMERON SOL-TAB) 15 MG disintegrating tablet TAKE 1 TABLET BY MOUTH EVERYDAY AT BEDTIME 90 tablet 1  . Multiple Vitamin (MULTIVITAMIN) tablet Take 1 tablet by mouth daily. Vitamin supplement.    . ondansetron (ZOFRAN) 4 MG tablet Take one tablet every 4-6 hours as needed for nausea 60 tablet 3  . pantoprazole (PROTONIX) 40 MG tablet TAKE 1 TABLET BY MOUTH 3O TO 60 MIN BEFORE YOUR FIRST AND LAST MEALS OF THE DAY 60 tablet 2  . sertraline (ZOLOFT) 100 MG tablet TAKE 1 TABLET BY MOUTH EVERY DAY 90 tablet 0   No current facility-administered medications on file prior to visit.     BP 120/70 (BP Location: Left Arm, Patient Position: Sitting, Cuff Size: Large)    Pulse 78   Temp 98.4 F (36.9 C) (Oral)   Wt 148 lb (67.1 kg)   SpO2 95%   BMI 26.22 kg/m     Review of Systems  Constitutional: Negative.   HENT: Positive for trouble swallowing. Negative for congestion,  dental problem, hearing loss, rhinorrhea, sinus pressure, sore throat and tinnitus.   Eyes: Negative for pain, discharge and visual disturbance.  Respiratory: Positive for cough. Negative for shortness of breath.   Cardiovascular: Negative for chest pain, palpitations and leg swelling.  Gastrointestinal: Negative for abdominal distention, abdominal pain, blood in stool, constipation, diarrhea, nausea and vomiting.  Genitourinary: Negative for difficulty urinating, dysuria, flank pain, frequency, hematuria, pelvic pain, urgency, vaginal bleeding, vaginal discharge and vaginal pain.  Musculoskeletal: Negative for arthralgias, gait problem and joint swelling.  Skin: Negative for rash.  Neurological: Negative for dizziness, syncope, speech difficulty, weakness, numbness and headaches.  Hematological: Negative for adenopathy.  Psychiatric/Behavioral: Positive for sleep disturbance. Negative for agitation, behavioral problems and dysphoric mood. The patient is nervous/anxious.        Objective:   Physical Exam  Constitutional: She appears well-developed and well-nourished. No distress.  Blood pressure 120/60  Pulmonary/Chest: She has rales.  Still with significant bibasilar rales  Musculoskeletal: She exhibits edema.  +2 pedal edema          Assessment & Plan:   Essential hypertension.  Blood pressure well controlled today we will decrease amlodipine to 5 mg daily Pedal edema hopeful to improve with amlodipine reduction  Follow-up 4 weeks  pulmonary and GI follow-up as scheduled  Gordy Savers

## 2018-05-04 NOTE — Patient Instructions (Signed)
Limit your sodium (Salt) intake  Decrease amlodipine to 5 mg daily  Please check your blood pressure on a regular basis.  If it is consistently greater than 150/90, please make an office appointment.  Return in 1 month for follow-up

## 2018-05-08 ENCOUNTER — Ambulatory Visit: Payer: Self-pay | Admitting: Internal Medicine

## 2018-05-19 ENCOUNTER — Encounter

## 2018-05-19 ENCOUNTER — Encounter: Payer: Self-pay | Admitting: Internal Medicine

## 2018-05-19 ENCOUNTER — Ambulatory Visit: Payer: Medicare Other | Admitting: Internal Medicine

## 2018-05-19 VITALS — BP 118/60 | HR 82 | Ht 63.0 in | Wt 148.0 lb

## 2018-05-19 DIAGNOSIS — R1310 Dysphagia, unspecified: Secondary | ICD-10-CM

## 2018-05-19 DIAGNOSIS — R634 Abnormal weight loss: Secondary | ICD-10-CM

## 2018-05-19 DIAGNOSIS — K219 Gastro-esophageal reflux disease without esophagitis: Secondary | ICD-10-CM | POA: Diagnosis not present

## 2018-05-19 DIAGNOSIS — Z9889 Other specified postprocedural states: Secondary | ICD-10-CM | POA: Diagnosis not present

## 2018-05-19 NOTE — Patient Instructions (Signed)
Please follow up in 3 months.  

## 2018-05-19 NOTE — Progress Notes (Signed)
HISTORY OF PRESENT ILLNESS:  Julie Rice is a 82 y.o. female with GERD status post fundoplication for refractory symptoms in the face of large hiatal hernia with associated chest pain and recurrent vomiting September 2017. Thereafter appearing degrees of progressive dysphagia as documented. Unresponsive to dilation. Several esophagrams without significant obstruction. Manometry revealed aperistalsis but was incomplete study as the probe was not felt to have passed into the stomach. Discussed with Dr. Lavon Paganini. Patient was hospitalized with vomiting. Question hematemesis. Upper endoscopy 04/07/2018 without significant abnormalities in the postoperative state. Patient presents today for follow-up. She is accompanied by her friend. She tells me that she has modified her diet over the past 4 weeks. In particular eating soft foods and liquid items. She still has  trouble with meats and breads. She tells me that she has gained 10 pounds since her nadir. Some cough at night. Otherwise stable  REVIEW OF SYSTEMS:  All non-GI ROS negative except for anxiety, arthritis, back pain, visual change, cough, depression, fatigue, sleeping problems, ankle swelling, urinary leakage, shortness of breath  Past Medical History:  Diagnosis Date  . Anemia   . Anxiety   . Bipolar disorder (HCC)   . Blood transfusion 1991   autologous pts own blood given   . CAD (coronary artery disease)   . Chest pain    "@ rest, lying down, w/exertion"  . Chronic back pain    "mostly lower back but I do have upper back pain regularly" (04/06/2018)  . COPD (chronic obstructive pulmonary disease) (HCC)   . Depression   . Esophageal stricture   . Fecal occult blood test positive   . Fibromyalgia   . GERD (gastroesophageal reflux disease)   . Heart murmur    "slight" (04/06/2018)  . Hiatal hernia   . Hyperlipidemia   . Hypertension   . Internal hemorrhoids   . Lumbago   . Mitral regurgitation   . Osteoarthritis   . Pneumonia  ~ 01/2018  . PONV (postoperative nausea and vomiting)    severe ponv, "in the past" (04/06/2018)  . Rectal bleeding   . Rheumatoid arthritis (HCC)   . Spondylosis   . TIA (transient ischemic attack) 2013  . Urinary incontinence    wears depends    Past Surgical History:  Procedure Laterality Date  . ABDOMINAL HYSTERECTOMY  1982  . BACK SURGERY    . BLADDER SUSPENSION  1980's  . CATARACT EXTRACTION W/ INTRAOCULAR LENS  IMPLANT, BILATERAL Bilateral 2010  . DILATION AND CURETTAGE OF UTERUS  1961  . ESOPHAGEAL MANOMETRY N/A 03/25/2018   Procedure: ESOPHAGEAL MANOMETRY (EM);  Surgeon: Napoleon Form, MD;  Location: [facility];  Service: Endoscopy;  Laterality: N/A;  . ESOPHAGOGASTRODUODENOSCOPY (EGD) WITH PROPOFOL N/A 04/07/2018   Procedure: ESOPHAGOGASTRODUODENOSCOPY (EGD) WITH PROPOFOL;  Surgeon: Sherrilyn Rist, MD;  Location: [facility] ENDOSCOPY;  Service: Gastroenterology;  Laterality: N/A;  . HERNIA REPAIR    . HIATAL HERNIA REPAIR N/A 08/02/2016   Procedure: LAPAROSCOPIC REPAIR OF LARGE  HIATAL HERNIA;  Surgeon: Glenna Fellows, MD;  Location: [facility];  Service: General;  Laterality: N/A;  . JOINT REPLACEMENT    . LAPAROSCOPIC NISSEN FUNDOPLICATION N/A 08/02/2016   Procedure: LAPAROSCOPIC NISSEN FUNDOPLICATION;  Surgeon: Glenna Fellows, MD;  Location: [facility];  Service: General;  Laterality: N/A;  . LUMBAR LAMINECTOMY  1990; 1994; [id number]   "I've got 2 stainless steel rods; 6 screws; 2 ray cages"took bone from right hip to put in back  . TONSILLECTOMY AND ADENOIDECTOMY  1945  . TOTAL KNEE ARTHROPLASTY Right ~ 1996  . TUBAL LIGATION  ~ 1976    Social History Julie Rice  reports that she has never smoked. She has never used smokeless tobacco. She reports that she does not drink alcohol or use drugs.  family history includes Heart disease (age of onset: 43) in her father; Hypertension in her mother; Kidney disease in her mother; Suicidality in her son.  Allergies   Allergen Reactions  . Metaxalone Other (See Comments)    Unknown  . Morphine Other (See Comments)    Flushing, rash, itching  . Tape Other (See Comments)    Band aides, adhesive tape Redness and pulls skin off       PHYSICAL EXAMINATION: Vital signs: BP 118/60   Pulse 82   Ht 5\' 3"  (1.6 m)   Wt 148 lb (67.1 kg)   BMI 26.22 kg/m   Constitutional: generally well-appearing, no acute distress Psychiatric: alert and oriented x3, cooperative Eyes: extraocular movements intact, anicteric, conjunctiva pink Mouth: oral pharynx moist, no lesions Neck: supple no lymphadenopathy Cardiovascular: heart regular rate and rhythm, no murmur Lungs: clear to auscultation bilaterally Abdomen: soft, nontender, nondistended, no obvious ascites, no peritoneal signs, normal bowel sounds, no organomegaly Rectal:omitted Extremities: no clubbing or cyanosis. 1+ lower extremity edema bilaterally, right greater than left Skin: no lesions on visible extremities Neuro: No focal deficits. Cranial nerves intact   ASSESSMENT:  #1. GERD with large hiatal hernia status post Nissen fundoplication September 2017 #2. Problems with significant dysphagia and aperistalsis on suboptimal manometry. Suspect outlet obstruction in the face of abnormal esophageal motility #3. Problems with weight loss secondary to #2 above. Improved recently with dietary modification  PLAN:  #1. I discussed with the patient prior specks of repeating manometry with endoscopic placement of the probe. We also discussed the differential diagnosis of her problem, favoring outlet obstruction which will require surgical revision. Understandably, she wishes to avoid surgery if possible. We are both pleased that she has been doing better with dietary modification both subjectively and objectively with 10 pound weight gain #2. Continue with dietary modification. May pure food. I would like to see her back in 3 months. Contact the office in the  interim if needed  20 minutes spent face-to-face with the patient. Greater than 50% a time use for counseling regarding her complicated esophageal issues

## 2018-06-03 ENCOUNTER — Ambulatory Visit: Payer: Medicare Other | Admitting: Internal Medicine

## 2018-06-03 ENCOUNTER — Encounter: Payer: Self-pay | Admitting: Internal Medicine

## 2018-06-03 VITALS — BP 110/60 | HR 69 | Temp 98.1°F | Wt 150.6 lb

## 2018-06-03 DIAGNOSIS — E039 Hypothyroidism, unspecified: Secondary | ICD-10-CM | POA: Diagnosis not present

## 2018-06-03 DIAGNOSIS — I10 Essential (primary) hypertension: Secondary | ICD-10-CM | POA: Diagnosis not present

## 2018-06-03 MED ORDER — AMLODIPINE BESYLATE 2.5 MG PO TABS: 2.5000 mg | ORAL_TABLET | Freq: Every day | ORAL | 3 refills | Status: DC

## 2018-06-03 NOTE — Patient Instructions (Addendum)
Limit your sodium (Salt) intake  Please check your blood pressure on a regular basis.  If it is consistently greater than 140/90, please make an office appointment.  Return in 3 months for follow-up  Decrease amlodipine to 2.5 mg daily

## 2018-06-03 NOTE — Progress Notes (Signed)
Subjective:    Patient ID: Julie Rice, female    DOB: [birth year], 82 y.o.   MRN: [id number]  HPI BP Readings from Last 3 Encounters:  06/03/18 110/60  05/19/18 118/60  05/04/18 120/70   Wt Readings from Last 3 Encounters:  06/03/18 150 lb 9.6 oz (68.3 kg)  05/19/18 148 lb (67.1 kg)  05/04/18 148 lb (36.61 kg)   82 year old patient who is seen today for follow-up.  She has essential hypertension and was seen 1 month ago due to worsening pedal edema after treatment with amlodipine 10 mg daily during a recent hospital admission. She has a history of chronic hypokalemia on diuretic therapy. Her pedal edema is much improved but she still has mild right greater than left ankle edema.  At times she has a difficult time getting her dress shoes on. Her pulmonary status seems stable.  She seems to be swallowing better.  Has been seen by GI recently  Past Medical History:  Diagnosis Date  . Anemia   . Anxiety   . Bipolar disorder (HCC)   . Blood transfusion 1991   autologous pts own blood given   . CAD (coronary artery disease)   . Chest pain    "@ rest, lying down, w/exertion"  . Chronic back pain    "mostly lower back but I do have upper back pain regularly" (04/06/2018)  . COPD (chronic obstructive pulmonary disease) (HCC)   . Depression   . Esophageal stricture   . Fecal occult blood test positive   . Fibromyalgia   . GERD (gastroesophageal reflux disease)   . Heart murmur    "slight" (04/06/2018)  . Hiatal hernia   . Hyperlipidemia   . Hypertension   . Internal hemorrhoids   . Lumbago   . Mitral regurgitation   . Osteoarthritis   . Pneumonia ~ 01/2018  . PONV (postoperative nausea and vomiting)    severe ponv, "in the past" (04/06/2018)  . Rectal bleeding   . Rheumatoid arthritis (HCC)   . Spondylosis   . TIA (transient ischemic attack) 2013  . Urinary incontinence    wears depends     Social History   Socioeconomic History  . Marital status: Widowed    Spouse  name: Not on file  . Number of children: 2  . Years of education: Not on file  . Highest education level: Not on file  Occupational History    Employer: RETIRED  Social Needs  . Financial resource strain: Not on file  . Food insecurity:    Worry: Not on file    Inability: Not on file  . Transportation needs:    Medical: Not on file    Non-medical: Not on file  Tobacco Use  . Smoking status: Never Smoker  . Smokeless tobacco: Never Used  Substance and Sexual Activity  . Alcohol use: Never    Frequency: Never  . Drug use: Never  . Sexual activity: Not Currently  Lifestyle  . Physical activity:    Days per week: Not on file    Minutes per session: Not on file  . Stress: Not on file  Relationships  . Social connections:    Talks on phone: Not on file    Gets together: Not on file    Attends religious service: Not on file    Active member of club or organization: Not on file    Attends meetings of clubs or organizations: Not on file    Relationship status: Not  on file  . Intimate partner violence:    Fear of current or ex partner: Not on file    Emotionally abused: Not on file    Physically abused: Not on file    Forced sexual activity: Not on file  Other Topics Concern  . Not on file  Social History Narrative  . Not on file    Past Surgical History:  Procedure Laterality Date  . ABDOMINAL HYSTERECTOMY  1982  . BACK SURGERY    . BLADDER SUSPENSION  1980's  . CATARACT EXTRACTION W/ INTRAOCULAR LENS  IMPLANT, BILATERAL Bilateral 2010  . DILATION AND CURETTAGE OF UTERUS  1961  . ESOPHAGEAL MANOMETRY N/A 03/25/2018   Procedure: ESOPHAGEAL MANOMETRY (EM);  Surgeon: Napoleon Form, MD;  Location: [facility];  Service: Endoscopy;  Laterality: N/A;  . ESOPHAGOGASTRODUODENOSCOPY (EGD) WITH PROPOFOL N/A 04/07/2018   Procedure: ESOPHAGOGASTRODUODENOSCOPY (EGD) WITH PROPOFOL;  Surgeon: Sherrilyn Rist, MD;  Location: [facility] ENDOSCOPY;  Service: Gastroenterology;  Laterality:  N/A;  . HERNIA REPAIR    . HIATAL HERNIA REPAIR N/A 08/02/2016   Procedure: LAPAROSCOPIC REPAIR OF LARGE  HIATAL HERNIA;  Surgeon: Glenna Fellows, MD;  Location: [facility];  Service: General;  Laterality: N/A;  . JOINT REPLACEMENT    . LAPAROSCOPIC NISSEN FUNDOPLICATION N/A 08/02/2016   Procedure: LAPAROSCOPIC NISSEN FUNDOPLICATION;  Surgeon: Glenna Fellows, MD;  Location: [facility];  Service: General;  Laterality: N/A;  . LUMBAR LAMINECTOMY  1990; 1994; [id number]   "I've got 2 stainless steel rods; 6 screws; 2 ray cages"took bone from right hip to put in back  . TONSILLECTOMY AND ADENOIDECTOMY  1945  . TOTAL KNEE ARTHROPLASTY Right ~ 1996  . TUBAL LIGATION  ~ 1976    Family History  Problem Relation Age of Onset  . Kidney disease Mother   . Hypertension Mother   . Heart disease Father 22       MI  . Suicidality Son   . Colon cancer Neg Hx   . Esophageal cancer Neg Hx   . Pancreatic cancer Neg Hx     Allergies  Allergen Reactions  . Metaxalone Other (See Comments)    Unknown  . Morphine Other (See Comments)    Flushing, rash, itching  . Tape Other (See Comments)    Band aides, adhesive tape Redness and pulls skin off    Current Outpatient Medications on File Prior to Visit  Medication Sig Dispense Refill  . amLODipine (NORVASC) 5 MG tablet Take 1 tablet (5 mg total) by mouth daily. 90 tablet 3  . aspirin 81 MG tablet Take 1 tablet (81 mg total) by mouth daily. For cardiovascular health.    . clonazePAM (KLONOPIN) 0.5 MG tablet TAKE 1 TABLET BY MOUTH 3 TIMES A DAY AS NEEDED 270 tablet 0  . feeding supplement, ENSURE ENLIVE, (ENSURE ENLIVE) LIQD Take 237 mLs by mouth 2 (two) times daily between meals. 237 mL 12  . gabapentin (NEURONTIN) 100 MG capsule TAKE 1 CAPSULE BY MOUTH THREE TIMES A DAY 270 capsule 0  . losartan (COZAAR) 100 MG tablet TAKE 1 TABLET BY MOUTH EVERY DAY 30 tablet 0  . mirtazapine (REMERON SOL-TAB) 15 MG disintegrating tablet TAKE 1 TABLET BY MOUTH EVERYDAY  AT BEDTIME 90 tablet 1  . Multiple Vitamin (MULTIVITAMIN) tablet Take 1 tablet by mouth daily. Vitamin supplement.    . ondansetron (ZOFRAN) 4 MG tablet Take one tablet every 4-6 hours as needed for nausea 60 tablet 3  . pantoprazole (PROTONIX) 40  MG tablet TAKE 1 TABLET BY MOUTH 3O TO 60 MIN BEFORE YOUR FIRST AND LAST MEALS OF THE DAY 60 tablet 2  . sertraline (ZOLOFT) 100 MG tablet TAKE 1 TABLET BY MOUTH EVERY DAY 90 tablet 0   No current facility-administered medications on file prior to visit.     BP 110/60 (BP Location: Right Arm, Patient Position: Sitting, Cuff Size: Large)   Pulse 69   Temp 98.1 F (36.7 C) (Oral)   Wt 150 lb 9.6 oz (68.3 kg)   SpO2 97%   BMI 26.68 kg/m     Review of Systems  Constitutional: Negative.   HENT: Negative for congestion, dental problem, hearing loss, rhinorrhea, sinus pressure, sore throat and tinnitus.   Eyes: Negative for pain, discharge and visual disturbance.  Respiratory: Positive for cough. Negative for shortness of breath.   Cardiovascular: Positive for leg swelling. Negative for chest pain and palpitations.  Gastrointestinal: Negative for abdominal distention, abdominal pain, blood in stool, constipation, diarrhea, nausea and vomiting.  Genitourinary: Negative for difficulty urinating, dysuria, flank pain, frequency, hematuria, pelvic pain, urgency, vaginal bleeding, vaginal discharge and vaginal pain.  Musculoskeletal: Negative for arthralgias, gait problem and joint swelling.  Skin: Negative for rash.  Neurological: Negative for dizziness, syncope, speech difficulty, weakness, numbness and headaches.  Hematological: Negative for adenopathy.  Psychiatric/Behavioral: Negative for agitation, behavioral problems and dysphoric mood. The patient is nervous/anxious.        Objective:   Physical Exam  Constitutional: She appears well-developed and well-nourished. No distress.  Blood pressure very well controlled  Pulmonary/Chest: She has  rales.  Rales left base  Musculoskeletal: She exhibits edema.  Patient has very mild ankle edema most marked laterally more prominent on the right          Assessment & Plan:   Essential hypertension.  Blood pressure well controlled Pedal edema.  Much improved with dose reduction of amlodipine blood pressure is low normal and we will further reduce to 2.5 mg daily.  Low-salt diet encouraged elevation encouraged.  Will try to avoid diuretic therapy in view of chronic hypokalemia with diuretic therapy  [employer]

## 2018-06-08 ENCOUNTER — Other Ambulatory Visit: Payer: Self-pay | Admitting: Internal Medicine

## 2018-06-28 ENCOUNTER — Other Ambulatory Visit: Payer: Self-pay | Admitting: Internal Medicine

## 2018-07-02 ENCOUNTER — Other Ambulatory Visit: Payer: Self-pay | Admitting: Family Medicine

## 2018-07-02 DIAGNOSIS — K219 Gastro-esophageal reflux disease without esophagitis: Secondary | ICD-10-CM

## 2018-07-15 ENCOUNTER — Other Ambulatory Visit: Payer: Self-pay | Admitting: Internal Medicine

## 2018-07-21 ENCOUNTER — Encounter: Payer: Self-pay | Admitting: Internal Medicine

## 2018-07-21 ENCOUNTER — Ambulatory Visit: Payer: Medicare Other | Admitting: Internal Medicine

## 2018-07-21 VITALS — BP 120/80 | HR 83 | Ht 63.0 in | Wt 155.0 lb

## 2018-07-21 DIAGNOSIS — K224 Dyskinesia of esophagus: Secondary | ICD-10-CM

## 2018-07-21 DIAGNOSIS — K219 Gastro-esophageal reflux disease without esophagitis: Secondary | ICD-10-CM | POA: Diagnosis not present

## 2018-07-21 DIAGNOSIS — R1310 Dysphagia, unspecified: Secondary | ICD-10-CM

## 2018-07-21 MED ORDER — PANTOPRAZOLE SODIUM 20 MG PO TBEC: 20.0000 mg | DELAYED_RELEASE_TABLET | Freq: Two times a day (BID) | ORAL | 6 refills | Status: DC

## 2018-07-21 NOTE — Progress Notes (Signed)
HISTORY OF PRESENT ILLNESS:  Julie Rice is a 82 y.o. female with GERD, history of esophageal stricture, large symptomatic hiatal hernia status post fundoplication, post fundoplication dysphagia secondary to dysmotility (primary versus secondary with technically limited esophageal manometry). She presents today for follow-up. She was seen in this office 2 months ago. At that time she was doing better with strict attention to her eating habits. Has gained weight. However, over the past weekend she has had several episodes of nocturnal regurgitation and vomiting. Worsening sensations with dysphagia. This was trying to advance her diet somewhat. No additional issues or problems.  REVIEW OF SYSTEMS:  All non-GI ROS negative except for anxiety, arthritis  Past Medical History:  Diagnosis Date  . Anemia   . Anxiety   . Bipolar disorder (HCC)   . Blood transfusion 1991   autologous pts own blood given   . CAD (coronary artery disease)   . Chest pain    "@ rest, lying down, w/exertion"  . Chronic back pain    "mostly lower back but I do have upper back pain regularly" (04/06/2018)  . COPD (chronic obstructive pulmonary disease) (HCC)   . Depression   . Esophageal stricture   . Fecal occult blood test positive   . Fibromyalgia   . GERD (gastroesophageal reflux disease)   . Heart murmur    "slight" (04/06/2018)  . Hiatal hernia   . Hyperlipidemia   . Hypertension   . Internal hemorrhoids   . Lumbago   . Mitral regurgitation   . Osteoarthritis   . Pneumonia ~ 01/2018  . PONV (postoperative nausea and vomiting)    severe ponv, "in the past" (04/06/2018)  . Rectal bleeding   . Rheumatoid arthritis (HCC)   . Spondylosis   . TIA (transient ischemic attack) 2013  . Urinary incontinence    wears depends    Past Surgical History:  Procedure Laterality Date  . ABDOMINAL HYSTERECTOMY  1982  . BACK SURGERY    . BLADDER SUSPENSION  1980's  . CATARACT EXTRACTION W/ INTRAOCULAR LENS   IMPLANT, BILATERAL Bilateral 2010  . DILATION AND CURETTAGE OF UTERUS  1961  . ESOPHAGEAL MANOMETRY N/A 03/25/2018   Procedure: ESOPHAGEAL MANOMETRY (EM);  Surgeon: Napoleon Form, MD;  Location: [facility];  Service: Endoscopy;  Laterality: N/A;  . ESOPHAGOGASTRODUODENOSCOPY (EGD) WITH PROPOFOL N/A 04/07/2018   Procedure: ESOPHAGOGASTRODUODENOSCOPY (EGD) WITH PROPOFOL;  Surgeon: Sherrilyn Rist, MD;  Location: [facility] ENDOSCOPY;  Service: Gastroenterology;  Laterality: N/A;  . HERNIA REPAIR    . HIATAL HERNIA REPAIR N/A 08/02/2016   Procedure: LAPAROSCOPIC REPAIR OF LARGE  HIATAL HERNIA;  Surgeon: Glenna Fellows, MD;  Location: [facility];  Service: General;  Laterality: N/A;  . JOINT REPLACEMENT    . LAPAROSCOPIC NISSEN FUNDOPLICATION N/A 08/02/2016   Procedure: LAPAROSCOPIC NISSEN FUNDOPLICATION;  Surgeon: Glenna Fellows, MD;  Location: [facility];  Service: General;  Laterality: N/A;  . LUMBAR LAMINECTOMY  1990; 1994; [id number]   "I've got 2 stainless steel rods; 6 screws; 2 ray cages"took bone from right hip to put in back  . TONSILLECTOMY AND ADENOIDECTOMY  1945  . TOTAL KNEE ARTHROPLASTY Right ~ 1996  . TUBAL LIGATION  ~ 1976    Social History Julie Rice  reports that she has never smoked. She has never used smokeless tobacco. She reports that she does not drink alcohol or use drugs.  family history includes Heart disease (age of onset: 85) in her father; Hypertension in her mother;  Kidney disease in her mother; Suicidality in her son.  Allergies  Allergen Reactions  . Metaxalone Other (See Comments)    Unknown  . Morphine Other (See Comments)    Flushing, rash, itching  . Tape Other (See Comments)    Band aides, adhesive tape Redness and pulls skin off       PHYSICAL EXAMINATION: Vital signs: BP 120/80   Pulse 83   Ht 5\' 3"  (1.6 m)   SpO2 98%   BMI 26.68 kg/m   Constitutional: generally well-appearing, no acute distress Psychiatric: alert and oriented x3,  cooperative Eyes: extraocular movements intact, anicteric, conjunctiva pink Mouth: oral pharynx moist, no lesions Neck: supple no lymphadenopathy Cardiovascular: heart regular rate and rhythm, no murmur Lungs: clear to auscultation bilaterally Abdomen: soft, nontender, nondistended, no obvious ascites, no peritoneal signs, normal bowel sounds, no organomegaly. No succussion splash Rectal:omitted Extremities: no clubbing, cyanosis, or lower extremity edema bilaterally Skin: no lesions on visible extremities Neuro: No focal deficits. Cranial nerves intact  ASSESSMENT:  #1. GERD with history of peptic stricture and large hiatal hernia status post fundoplication. Post fundoplication dysphagia secondary to dysmotility with aperistalsis on manometry. Primary versus secondary (outlet obstruction from surgery). Had been doing better with strict attention to dietary habits. At that time no further vomiting and she demonstrated weight gain. Now with recurrent symptoms seemingly related to advancing of her diet versus worsening of her problem   PLAN:  #1. Patient is not interested in repeat manometry with the prospects of redo fundoplication. As such, I offered her upper endoscopy with Botox injection followed by large balloon dilation of the distal esophagus in hopes of providing her some relief. Limitations of this approach (based on the actual etiology of her dysmotility) were candidly discussed. She is interested in proceeding. In the interim, I asked her to pure her diet and eat smaller portions more frequently and be particularly careful not to eat within 4 hours of bedtime. Also elevate the head of the bed.The nature of the procedure, as well as the risks, benefits, and alternatives were carefully and thoroughly reviewed with the patient. Ample time for discussion and questions allowed. The patient understood, was satisfied, and agreed to proceed.  25 minutes spent face-to-face with the patient.  Greater than 50% a time use for counseling regarding her esophageal motility issues

## 2018-07-21 NOTE — Patient Instructions (Addendum)
We have sent the following medications to your pharmacy for you to pick up at your convenience:  Protonix - twice a day  Puree your food to make swallowing easier  We will call you to schedule an endoscopy at the hospital

## 2018-07-24 DIAGNOSIS — M25562 Pain in left knee: Secondary | ICD-10-CM | POA: Diagnosis not present

## 2018-07-24 DIAGNOSIS — M1711 Unilateral primary osteoarthritis, right knee: Secondary | ICD-10-CM | POA: Diagnosis not present

## 2018-07-24 DIAGNOSIS — M1712 Unilateral primary osteoarthritis, left knee: Secondary | ICD-10-CM | POA: Diagnosis not present

## 2018-07-24 DIAGNOSIS — M170 Bilateral primary osteoarthritis of knee: Secondary | ICD-10-CM | POA: Diagnosis not present

## 2018-07-31 ENCOUNTER — Ambulatory Visit: Payer: Medicare Other | Admitting: Adult Health

## 2018-07-31 ENCOUNTER — Telehealth: Payer: Self-pay

## 2018-07-31 ENCOUNTER — Encounter: Payer: Self-pay | Admitting: Adult Health

## 2018-07-31 VITALS — BP 158/78 | HR 55 | Temp 98.3°F | Wt 153.0 lb

## 2018-07-31 DIAGNOSIS — N3001 Acute cystitis with hematuria: Secondary | ICD-10-CM | POA: Diagnosis not present

## 2018-07-31 LAB — POCT URINALYSIS DIPSTICK
Bilirubin, UA: NEGATIVE
Blood, UA: POSITIVE
GLUCOSE UA: NEGATIVE
Ketones, UA: NEGATIVE
Nitrite, UA: POSITIVE
Protein, UA: POSITIVE — AB
Spec Grav, UA: <=1.005 — AB (ref 1.010–1.025)
Urobilinogen, UA: 0.2 E.U./dL
pH, UA: 8.5 — AB (ref 5.0–8.0)

## 2018-07-31 MED ORDER — PHENAZOPYRIDINE HCL 100 MG PO TABS: 100.0000 mg | ORAL_TABLET | Freq: Three times a day (TID) | ORAL | 0 refills | Status: DC | PRN

## 2018-07-31 MED ORDER — CIPROFLOXACIN HCL 500 MG PO TABS: 500.0000 mg | ORAL_TABLET | Freq: Two times a day (BID) | ORAL | 0 refills | Status: AC

## 2018-07-31 NOTE — Telephone Encounter (Signed)
Lm on vm that I had scheduled patient's endoscopy with balloon dilation and botox at [facility] on 09/21/2018 at 11:15am.  I instructed her to call back to schedule a previsit appointment.  If the hospital date doesn't work she needs to call me back specifically

## 2018-07-31 NOTE — Progress Notes (Signed)
   Subjective:    Patient ID: Julie Rice, female    DOB: Mar 27, 1936, 82 y.o.   MRN: [id number]  Urinary Tract Infection   This is a new problem. The current episode started in the past 7 days (3 weeks). The problem occurs every urination. The problem has been gradually worsening. The quality of the pain is described as burning. There has been no fever. She is not sexually active. There is a history of pyelonephritis. Associated symptoms include flank pain. Pertinent negatives include no chills, discharge, frequency, hematuria, nausea, sweats, urgency or vomiting. She has tried nothing for the symptoms. Her past medical history is significant for recurrent UTIs.      Review of Systems  Constitutional: Negative for chills.  Gastrointestinal: Positive for abdominal pain. Negative for diarrhea, nausea, rectal pain and vomiting.  Genitourinary: Positive for flank pain. Negative for frequency, hematuria and urgency.       Objective:   Physical Exam  Constitutional: She is oriented to person, place, and time. She appears well-developed and well-nourished. No distress.  Cardiovascular: Normal rate, regular rhythm, normal heart sounds and intact distal pulses.  Pulmonary/Chest: Effort normal and breath sounds normal.  Abdominal: Soft. Bowel sounds are normal. There is tenderness in the suprapubic area. There is CVA tenderness.  Neurological: She is alert and oriented to person, place, and time.  Skin: Skin is warm. She is not diaphoretic.  Psychiatric: She has a normal mood and affect. Her behavior is normal. Judgment and thought content normal.  Nursing note and vitals reviewed.     Assessment & Plan:  1. Acute cystitis with hematuria - POC Urinalysis Dipstick + UTI. Pyuria present  - ciprofloxacin (CIPRO) 500 MG tablet; Take 1 tablet (500 mg total) by mouth 2 (two) times daily for 5 days.  Dispense: 10 tablet; Refill: 0 - phenazopyridine (PYRIDIUM) 100 MG tablet; Take 1 tablet (100 mg  total) by mouth 3 (three) times daily as needed for pain.  Dispense: 10 tablet; Refill: 0 - Culture, Urine  Shirline Frees, NP

## 2018-08-03 LAB — URINE CULTURE
MICRO NUMBER:: 91164412
SPECIMEN QUALITY:: ADEQUATE

## 2018-08-07 ENCOUNTER — Other Ambulatory Visit: Payer: Self-pay

## 2018-08-07 DIAGNOSIS — R1310 Dysphagia, unspecified: Secondary | ICD-10-CM

## 2018-08-07 NOTE — Telephone Encounter (Signed)
Patient scheduled for previsit

## 2018-08-18 ENCOUNTER — Ambulatory Visit: Payer: Self-pay | Admitting: Internal Medicine

## 2018-08-31 ENCOUNTER — Telehealth: Payer: Self-pay | Admitting: Internal Medicine

## 2018-08-31 ENCOUNTER — Other Ambulatory Visit: Payer: Self-pay

## 2018-08-31 MED ORDER — PANTOPRAZOLE SODIUM 40 MG PO TBEC: 40.0000 mg | DELAYED_RELEASE_TABLET | Freq: Two times a day (BID) | ORAL | 3 refills | Status: DC

## 2018-08-31 MED ORDER — SUCRALFATE 1 G PO TABS: 1.0000 g | ORAL_TABLET | Freq: Four times a day (QID) | ORAL | 1 refills | Status: DC

## 2018-08-31 NOTE — Telephone Encounter (Signed)
Spoke to patient and explained that her hospital appointment was the only time he had until the new year.  Patient will keep her 11/18 appointment.

## 2018-08-31 NOTE — Telephone Encounter (Signed)
Dr. [name] pt=Spoke with pt-she had an "episode" of intense heartburn on Sat around 4:45pm-took her protonix and Tums without relief-pt states she took her sister's Carafate with sips pf water and had relief; heartburn has continued but has improved a "little bit since then"; during this episode, pt reported BP 236/93 accompanied by headache-today BP 176/83-pt was advised to follow up with PCP concerning this; pt verbalized understanding to follow up with PCP;  Pt would like a Rx of Carafate if possible; pt is currently on Protonix 20mg  BID; please advise as DOD;

## 2018-08-31 NOTE — Telephone Encounter (Signed)
Pt wants to r/s her procedure at [facility] hosp scheduled on 09/21/18 for a sooner date. Pls call her.

## 2018-08-31 NOTE — Telephone Encounter (Signed)
Mychart message sent to patient; prescriptions sent to pharmacy;

## 2018-08-31 NOTE — Telephone Encounter (Signed)
Please offer the patient Carafate 1 g QID (as Elixir or pill) x 2 weeks in addition to increasing her Protonix to 40 mg BID. She must call her PCP today about her blood pressure.  Please place the patient on a cancellation list if Dr. [name] does not have an endoscopy appointment available prior to 09/21/18. Hopefully the Carafate will provide relief in the meantime.

## 2018-08-31 NOTE — Telephone Encounter (Signed)
Patient states her gi symptoms are getting worse and wants to know if she can move her procedure up from 11.18.19 at the hosp. See phone note from 9.27.19

## 2018-09-03 DIAGNOSIS — M1712 Unilateral primary osteoarthritis, left knee: Secondary | ICD-10-CM | POA: Diagnosis not present

## 2018-09-04 ENCOUNTER — Other Ambulatory Visit: Payer: Self-pay | Admitting: Internal Medicine

## 2018-09-07 ENCOUNTER — Ambulatory Visit (AMBULATORY_SURGERY_CENTER): Payer: Self-pay

## 2018-09-07 VITALS — Ht 64.5 in | Wt 156.8 lb

## 2018-09-07 DIAGNOSIS — R1310 Dysphagia, unspecified: Secondary | ICD-10-CM

## 2018-09-07 NOTE — Progress Notes (Signed)
Denies allergies to eggs or soy products. Denies complication of anesthesia or sedation. Denies use of weight loss medication. Denies use of O2.   Emmi instructions declined.  

## 2018-09-10 DIAGNOSIS — M1712 Unilateral primary osteoarthritis, left knee: Secondary | ICD-10-CM | POA: Diagnosis not present

## 2018-09-11 ENCOUNTER — Telehealth: Payer: Self-pay | Admitting: Internal Medicine

## 2018-09-11 NOTE — Telephone Encounter (Signed)
Rec'd from [facility] forwarded 4 pages to Dr. Yancey Flemings

## 2018-09-15 ENCOUNTER — Other Ambulatory Visit: Payer: Self-pay | Admitting: Internal Medicine

## 2018-09-16 NOTE — Telephone Encounter (Signed)
Okay for refill? Please advise  Pt see Julie Rice on 12/3

## 2018-09-17 DIAGNOSIS — M1712 Unilateral primary osteoarthritis, left knee: Secondary | ICD-10-CM | POA: Diagnosis not present

## 2018-09-21 ENCOUNTER — Ambulatory Visit (HOSPITAL_COMMUNITY): Payer: Medicare Other | Admitting: Anesthesiology

## 2018-09-21 ENCOUNTER — Encounter (HOSPITAL_COMMUNITY)
Admission: RE | Disposition: A | Discharge status: Home / Self Care | Payer: Self-pay | Source: Ambulatory Visit | Attending: Internal Medicine

## 2018-09-21 ENCOUNTER — Ambulatory Visit (HOSPITAL_COMMUNITY)
Admission: RE | Admit: 2018-09-21 | Discharge: 2018-09-21 | Disposition: A | Discharge status: Home / Self Care | Payer: Medicare Other | Source: Ambulatory Visit | Attending: Internal Medicine | Admitting: Internal Medicine

## 2018-09-21 ENCOUNTER — Encounter (HOSPITAL_COMMUNITY): Payer: Self-pay | Admitting: *Deleted

## 2018-09-21 ENCOUNTER — Other Ambulatory Visit: Payer: Self-pay

## 2018-09-21 DIAGNOSIS — K449 Diaphragmatic hernia without obstruction or gangrene: Secondary | ICD-10-CM | POA: Insufficient documentation

## 2018-09-21 DIAGNOSIS — I10 Essential (primary) hypertension: Secondary | ICD-10-CM | POA: Insufficient documentation

## 2018-09-21 DIAGNOSIS — R1310 Dysphagia, unspecified: Secondary | ICD-10-CM

## 2018-09-21 DIAGNOSIS — J449 Chronic obstructive pulmonary disease, unspecified: Secondary | ICD-10-CM | POA: Diagnosis not present

## 2018-09-21 DIAGNOSIS — K222 Esophageal obstruction: Secondary | ICD-10-CM

## 2018-09-21 DIAGNOSIS — K224 Dyskinesia of esophagus: Secondary | ICD-10-CM

## 2018-09-21 DIAGNOSIS — K220 Achalasia of cardia: Secondary | ICD-10-CM

## 2018-09-21 DIAGNOSIS — M1990 Unspecified osteoarthritis, unspecified site: Secondary | ICD-10-CM | POA: Insufficient documentation

## 2018-09-21 DIAGNOSIS — M069 Rheumatoid arthritis, unspecified: Secondary | ICD-10-CM | POA: Diagnosis not present

## 2018-09-21 DIAGNOSIS — F319 Bipolar disorder, unspecified: Secondary | ICD-10-CM | POA: Diagnosis not present

## 2018-09-21 DIAGNOSIS — E039 Hypothyroidism, unspecified: Secondary | ICD-10-CM | POA: Insufficient documentation

## 2018-09-21 DIAGNOSIS — F419 Anxiety disorder, unspecified: Secondary | ICD-10-CM | POA: Insufficient documentation

## 2018-09-21 DIAGNOSIS — E785 Hyperlipidemia, unspecified: Secondary | ICD-10-CM | POA: Diagnosis not present

## 2018-09-21 DIAGNOSIS — Z8673 Personal history of transient ischemic attack (TIA), and cerebral infarction without residual deficits: Secondary | ICD-10-CM | POA: Diagnosis not present

## 2018-09-21 DIAGNOSIS — R112 Nausea with vomiting, unspecified: Secondary | ICD-10-CM | POA: Diagnosis not present

## 2018-09-21 DIAGNOSIS — K219 Gastro-esophageal reflux disease without esophagitis: Secondary | ICD-10-CM | POA: Insufficient documentation

## 2018-09-21 DIAGNOSIS — I2510 Atherosclerotic heart disease of native coronary artery without angina pectoris: Secondary | ICD-10-CM | POA: Diagnosis not present

## 2018-09-21 DIAGNOSIS — R4702 Dysphasia: Secondary | ICD-10-CM | POA: Insufficient documentation

## 2018-09-21 DIAGNOSIS — M545 Low back pain: Secondary | ICD-10-CM | POA: Diagnosis not present

## 2018-09-21 DIAGNOSIS — M797 Fibromyalgia: Secondary | ICD-10-CM | POA: Insufficient documentation

## 2018-09-21 DIAGNOSIS — G8929 Other chronic pain: Secondary | ICD-10-CM | POA: Insufficient documentation

## 2018-09-21 HISTORY — PX: ESOPHAGOGASTRODUODENOSCOPY (EGD) WITH PROPOFOL: SHX5813

## 2018-09-21 HISTORY — PX: BOTOX INJECTION: SHX5754

## 2018-09-21 HISTORY — PX: BALLOON DILATION: SHX5330

## 2018-09-21 SURGERY — ESOPHAGOGASTRODUODENOSCOPY (EGD) WITH PROPOFOL
Anesthesia: Monitor Anesthesia Care

## 2018-09-21 MED ORDER — PROPOFOL 500 MG/50ML IV EMUL
INTRAVENOUS | Status: DC | PRN
  Administered 2018-09-21: 120 ug/kg/min via INTRAVENOUS

## 2018-09-21 MED ORDER — ONABOTULINUMTOXINA 100 UNITS IJ SOLR
INTRAMUSCULAR | Status: AC
  Filled 2018-09-21: qty 100

## 2018-09-21 MED ORDER — SODIUM CHLORIDE (PF) 0.9 % IJ SOLN
INTRAMUSCULAR | Status: AC
  Filled 2018-09-21: qty 10

## 2018-09-21 MED ORDER — SODIUM CHLORIDE (PF) 0.9 % IJ SOLN
INTRAMUSCULAR | Status: DC | PRN
  Administered 2018-09-21: 4 mL via SUBMUCOSAL

## 2018-09-21 MED ORDER — SODIUM CHLORIDE 0.9 % IV SOLN: INTRAVENOUS | Status: DC

## 2018-09-21 MED ORDER — PROPOFOL 10 MG/ML IV BOLUS
INTRAVENOUS | Status: DC | PRN
  Administered 2018-09-21: 40 mg via INTRAVENOUS
  Administered 2018-09-21: 30 mg via INTRAVENOUS
  Administered 2018-09-21: 20 mg via INTRAVENOUS

## 2018-09-21 MED ORDER — LACTATED RINGERS IV SOLN
INTRAVENOUS | Status: DC
  Administered 2018-09-21: 11:00:00 via INTRAVENOUS

## 2018-09-21 SURGICAL SUPPLY — 14 items

## 2018-09-21 NOTE — H&P (Signed)
HISTORY OF PRESENT ILLNESS:  Julie Rice is a 82 y.o. female with GERD, history of esophageal stricture, large symptomatic hiatal hernia status post fundoplication, post fundoplication dysphagia secondary to dysmotility (primary versus secondary with technically limited esophageal manometry). She presents today for follow-up. She was seen in this office 2 months ago. At that time she was doing better with strict attention to her eating habits. Has gained weight. However, over the past weekend she has had several episodes of nocturnal regurgitation and vomiting. Worsening sensations with dysphagia. This was trying to advance her diet somewhat. No additional issues or problems.  REVIEW OF SYSTEMS:  All non-GI ROS negative except for anxiety, arthritis      Past Medical History:  Diagnosis Date  . Anemia   . Anxiety   . Bipolar disorder (HCC)   . Blood transfusion 1991   autologous pts own blood given   . CAD (coronary artery disease)   . Chest pain    "@ rest, lying down, w/exertion"  . Chronic back pain    "mostly lower back but I do have upper back pain regularly" (04/06/2018)  . COPD (chronic obstructive pulmonary disease) (HCC)   . Depression   . Esophageal stricture   . Fecal occult blood test positive   . Fibromyalgia   . GERD (gastroesophageal reflux disease)   . Heart murmur    "slight" (04/06/2018)  . Hiatal hernia   . Hyperlipidemia   . Hypertension   . Internal hemorrhoids   . Lumbago   . Mitral regurgitation   . Osteoarthritis   . Pneumonia ~ 01/2018  . PONV (postoperative nausea and vomiting)    severe ponv, "in the past" (04/06/2018)  . Rectal bleeding   . Rheumatoid arthritis (HCC)   . Spondylosis   . TIA (transient ischemic attack) 2013  . Urinary incontinence    wears depends         Past Surgical History:  Procedure Laterality Date  . ABDOMINAL HYSTERECTOMY  1982  . BACK SURGERY    . BLADDER SUSPENSION  1980's   . CATARACT EXTRACTION W/ INTRAOCULAR LENS  IMPLANT, BILATERAL Bilateral 2010  . DILATION AND CURETTAGE OF UTERUS  1961  . ESOPHAGEAL MANOMETRY N/A 03/25/2018   Procedure: ESOPHAGEAL MANOMETRY (EM);  Surgeon: Napoleon Form, MD;  Location: [facility] ENDOSCOPY;  Service: Endoscopy;  Laterality: N/A;  . ESOPHAGOGASTRODUODENOSCOPY (EGD) WITH PROPOFOL N/A 04/07/2018   Procedure: ESOPHAGOGASTRODUODENOSCOPY (EGD) WITH PROPOFOL;  Surgeon: Sherrilyn Rist, MD;  Location: [facility] ENDOSCOPY;  Service: Gastroenterology;  Laterality: N/A;  . HERNIA REPAIR    . HIATAL HERNIA REPAIR N/A 08/02/2016   Procedure: LAPAROSCOPIC REPAIR OF LARGE  HIATAL HERNIA;  Surgeon: Glenna Fellows, MD;  Location: [facility] ORS;  Service: General;  Laterality: N/A;  . JOINT REPLACEMENT    . LAPAROSCOPIC NISSEN FUNDOPLICATION N/A 08/02/2016   Procedure: LAPAROSCOPIC NISSEN FUNDOPLICATION;  Surgeon: Glenna Fellows, MD;  Location: [facility] ORS;  Service: General;  Laterality: N/A;  . LUMBAR LAMINECTOMY  1990; 1994; [id number]   "I've got 2 stainless steel rods; 6 screws; 2 ray cages"took bone from right hip to put in back  . TONSILLECTOMY AND ADENOIDECTOMY  1945  . TOTAL KNEE ARTHROPLASTY Right ~ 1996  . TUBAL LIGATION  ~ 1976    Social History Julie Rice  reports that she has never smoked. She has never used smokeless tobacco. She reports that she does not drink alcohol or use drugs.  family history includes Heart disease (age of  onset: 87) in her father; Hypertension in her mother; Kidney disease in her mother; Suicidality in her son.       Allergies  Allergen Reactions  . Metaxalone Other (See Comments)    Unknown  . Morphine Other (See Comments)    Flushing, rash, itching  . Tape Other (See Comments)    Band aides, adhesive tape Redness and pulls skin off       PHYSICAL EXAMINATION: Vital signs: BP 120/80   Pulse 83   Ht 5\' 3"  (1.6 m)   SpO2 98%   BMI 26.68 kg/m   Constitutional:  generally well-appearing, no acute distress Psychiatric: alert and oriented x3, cooperative Eyes: extraocular movements intact, anicteric, conjunctiva pink Mouth: oral pharynx moist, no lesions Neck: supple no lymphadenopathy Cardiovascular: heart regular rate and rhythm, no murmur Lungs: clear to auscultation bilaterally Abdomen: soft, nontender, nondistended, no obvious ascites, no peritoneal signs, normal bowel sounds, no organomegaly. No succussion splash Rectal:omitted Extremities: no clubbing, cyanosis, or lower extremity edema bilaterally Skin: no lesions on visible extremities Neuro: No focal deficits. Cranial nerves intact  ASSESSMENT:  #1. GERD with history of peptic stricture and large hiatal hernia status post fundoplication. Post fundoplication dysphagia secondary to dysmotility with aperistalsis on manometry. Primary versus secondary (outlet obstruction from surgery). Had been doing better with strict attention to dietary habits. At that time no further vomiting and she demonstrated weight gain. Now with recurrent symptoms seemingly related to advancing of her diet versus worsening of her problem   PLAN:  #1. Patient is not interested in repeat manometry with the prospects of redo fundoplication. As such, I offered her upper endoscopy with Botox injection followed by large balloon dilation of the distal esophagus in hopes of providing her some relief. Limitations of this approach (based on the actual etiology of her dysmotility) were candidly discussed. She is interested in proceeding. In the interim, I asked her to pure her diet and eat smaller portions more frequently and be particularly careful not to eat within 4 hours of bedtime. Also elevate the head of the bed.The nature of the procedure, as well as the risks, benefits, and alternatives were carefully and thoroughly reviewed with the patient. Ample time for discussion and questions allowed. The patient understood, was  satisfied, and agreed to proceed.  ADDENDUM Patient seen and examined at bedside today.  She presents for upper endoscopy with Botox injection and balloon dilation to see if this helps her chronic dysphasia with evidence of outlet obstruction on manometry.  No interval problems since her last visit the symptoms persist.The nature of the procedure, as well as the risks, benefits, and alternatives were carefully and thoroughly reviewed with the patient. Ample time for discussion and questions allowed. The patient understood, was satisfied, and agreed to proceed.  . Wilhemina Bonito., M.D. [facility] Division of Gastroenterology

## 2018-09-21 NOTE — Anesthesia Procedure Notes (Signed)
Procedure Name: MAC Date/Time: 09/21/2018 11:05 AM Performed by: Lollie Sails, CRNA Pre-anesthesia Checklist: Patient identified, Emergency Drugs available, Suction available, Patient being monitored and Timeout performed Oxygen Delivery Method: Nasal cannula

## 2018-09-21 NOTE — Anesthesia Postprocedure Evaluation (Signed)
Anesthesia Post Note  Patient: Julie Rice  Procedure(s) Performed: ESOPHAGOGASTRODUODENOSCOPY (EGD) WITH PROPOFOL (N/A ) BALLOON DILATION (N/A ) BOTOX INJECTION (N/A )     Patient location during evaluation: Endoscopy Anesthesia Type: MAC Level of consciousness: awake and alert Pain management: pain level controlled Vital Signs Assessment: post-procedure vital signs reviewed and stable Respiratory status: spontaneous breathing, nonlabored ventilation, respiratory function stable and patient connected to nasal cannula oxygen Cardiovascular status: stable and blood pressure returned to baseline Postop Assessment: no apparent nausea or vomiting Anesthetic complications: no    Last Vitals:  Vitals:   09/21/18 1130 09/21/18 1140  BP: (!) 169/81 (!) 164/79  Pulse: 83 75  Resp: 14 14  Temp:    SpO2: 100% 98%    Last Pain:  Vitals:   09/21/18 1125  TempSrc: Oral  PainSc: 0-No pain                 Merric Yost

## 2018-09-21 NOTE — Op Note (Signed)
[facility] Patient Name: Julie Rice Procedure Date: 09/21/2018 MRN: [id number] Attending MD: Wilhemina Bonito. [name] , MD Date of Birth: 10/07/1936 CSN: [id number] Age: 82 Admit Type: Outpatient Procedure:                Upper GI endoscopy with submucosal injection;                            balloon dilation esophagus - 26mm Indications:              Dysphagia, Nausea with vomiting Providers:                Wilhemina Bonito. [name], MD, Tillie Fantasia, RN, Harrington Challenger, Technician, Lynnell Jude. Freida Busman CRNA, CRNA Referring MD:              Medicines:                Monitored Anesthesia Care Complications:            No immediate complications. Estimated Blood Loss:     Estimated blood loss: none. Procedure:                Pre-Anesthesia Assessment:                           - Prior to the procedure, a History and Physical                            was performed, and patient medications and                            allergies were reviewed. The patient's tolerance of                            previous anesthesia was also reviewed. The risks                            and benefits of the procedure and the sedation                            options and risks were discussed with the patient.                            All questions were answered, and informed consent                            was obtained. Prior Anticoagulants: The patient has                            taken no previous anticoagulant or antiplatelet                            agents. ASA Grade Assessment: II - A patient with  mild systemic disease. After reviewing the risks                            and benefits, the patient was deemed in                            satisfactory condition to undergo the procedure.                           After obtaining informed consent, the endoscope was                            passed under direct vision. Throughout the                      procedure, the patient's blood pressure, pulse, and                            oxygen saturations were monitored continuously. The                            GIF-H190 ([id number]) Olympus adult endoscope was                            introduced through the mouth, and advanced to the                            second part of duodenum. The upper GI endoscopy was                            accomplished without difficulty. The patient                            tolerated the procedure well. Scope In: Scope Out: Findings:      The esophagus was dilated and atonic. There was a small diverticulum in       the distalmost portion. One center above the gastroesophageal junction       in 4 quadrants at 45 degree angle was successfully injected with 100       units botulinum toxin. 25 units in each quadrant.      One benign-appearing, intrinsic moderate stenosis was found. A TTS       dilator was passed through the scope. Dilation with an 18-19-20 mm       balloon dilator was performed to 20 mm.      The stomach revealed a hiatal hernia and evidence of prior hernia repair.      The examined duodenum was normal.      The cardia and gastric fundus were normal on retroflexion. Impression:               1. Chronic dysphasia secondary to dysmotility                            status post Botox injection  2. Large caliber distal esophageal stricture                            balloon dilated                           3. Status post repair of hiatal hernia. Small                            hiatal hernia present at this time. Otherwise                            negative exam-                           . Moderate Sedation:      none Recommendation:           1. Post dilation diet                           2. Eat small meals more frequently                           3. Do not eat close to bedtime and elevate head of                            bed to avoid  regurgitation of esophageal contents                           4. Office follow-up with Dr. [name] in 3 months. Procedure Code(s):        --- Professional ---                           [phone], Esophagogastroduodenoscopy, flexible,                            transoral; with transendoscopic balloon dilation of                            esophagus (less than 30 mm diameter) Diagnosis Code(s):        --- Professional ---                           K22.2, Esophageal obstruction                           R13.10, Dysphagia, unspecified                           R11.2, Nausea with vomiting, unspecified CPT copyright 2018 American Medical Association. All rights reserved. The codes documented in this report are preliminary and upon coder review may  be revised to meet current compliance requirements. Wilhemina Bonito. [name], MD 09/21/2018 11:27:51 AM This report has been signed electronically. Number of Addenda: 0

## 2018-09-21 NOTE — Anesthesia Preprocedure Evaluation (Signed)
Anesthesia Evaluation  Patient identified by MRN, date of birth, ID band Patient awake    Reviewed: Allergy & Precautions, NPO status , Patient's Chart, lab work & pertinent test results  History of Anesthesia Complications (+) PONV and history of anesthetic complications  Airway Mallampati: II  TM Distance: >3 FB Neck ROM: Full    Dental  (+) Teeth Intact   Pulmonary COPD,    breath sounds clear to auscultation       Cardiovascular hypertension, Pt. on medications + CAD   Rhythm:Regular     Neuro/Psych PSYCHIATRIC DISORDERS Anxiety Depression Bipolar Disorder TIA Neuromuscular disease    GI/Hepatic hiatal hernia, GERD  Medicated and Controlled,  Endo/Other  Hypothyroidism   Renal/GU      Musculoskeletal  (+) Arthritis , Fibromyalgia -  Abdominal   Peds  Hematology  (+) anemia ,   Anesthesia Other Findings   Reproductive/Obstetrics                             Anesthesia Physical Anesthesia Plan  ASA: II  Anesthesia Plan: MAC   Post-op Pain Management:    Induction: Intravenous  PONV Risk Score and Plan: 3 and Treatment may vary due to age or medical condition  Airway Management Planned: Nasal Cannula  Additional Equipment:   Intra-op Plan:   Post-operative Plan:   Informed Consent: I have reviewed the patients History and Physical, chart, labs and discussed the procedure including the risks, benefits and alternatives for the proposed anesthesia with the patient or authorized representative who has indicated his/her understanding and acceptance.   Dental advisory given  Plan Discussed with: CRNA and Surgeon  Anesthesia Plan Comments:         Anesthesia Quick Evaluation

## 2018-09-21 NOTE — Discharge Instructions (Signed)
YOU HAD AN ENDOSCOPIC PROCEDURE TODAY: Refer to the procedure report and other information in the discharge instructions given to you for any specific questions about what was found during the examination. If this information does not answer your questions, please call [facility] office at [phone] to clarify.   YOU SHOULD EXPECT: Some feelings of bloating in the abdomen. Passage of more gas than usual. Walking can help get rid of the air that was put into your GI tract during the procedure and reduce the bloating. If you had a lower endoscopy (such as a colonoscopy or flexible sigmoidoscopy) you may notice spotting of blood in your stool or on the toilet paper. Some abdominal soreness may be present for a day or two, also.  DIET: Your first meal following the procedure should be a light meal and then it is ok to progress to your normal diet. A half-sandwich or bowl of soup is an example of a good first meal. Heavy or fried foods are harder to digest and may make you feel nauseous or bloated. Drink plenty of fluids but you should avoid alcoholic beverages for 24 hours. If you had a esophageal dilation, please see attached instructions for diet.    ACTIVITY: Your care partner should take you home directly after the procedure. You should plan to take it easy, moving slowly for the rest of the day. You can resume normal activity the day after the procedure however YOU SHOULD NOT DRIVE, use power tools, machinery or perform tasks that involve climbing or major physical exertion for 24 hours (because of the sedation medicines used during the test).   SYMPTOMS TO REPORT IMMEDIATELY: A gastroenterologist can be reached at any hour. Please call [phone]  for any of the following symptoms:   Following upper endoscopy (EGD, EUS, ERCP, esophageal dilation) Vomiting of blood or coffee ground material  New, significant abdominal pain  New, significant chest pain or pain under the shoulder blades  Painful or  persistently difficult swallowing  New shortness of breath  Black, tarry-looking or red, bloody stools  FOLLOW UP:  If any biopsies were taken you will be contacted by phone or by letter within the next 1-3 weeks. Call [phone]  if you have not heard about the biopsies in 3 weeks.  Please also call with any specific questions about appointments or follow up tests.  

## 2018-09-21 NOTE — Transfer of Care (Signed)
Immediate Anesthesia Transfer of Care Note  Patient: Julie Rice  Procedure(s) Performed: ESOPHAGOGASTRODUODENOSCOPY (EGD) WITH PROPOFOL (N/A ) BALLOON DILATION (N/A ) BOTOX INJECTION (N/A )  Patient Location: PACU and Endoscopy Unit  Anesthesia Type:MAC  Level of Consciousness: awake, alert  and patient cooperative  Airway & Oxygen Therapy: Patient Spontanous Breathing and Patient connected to nasal cannula oxygen  Post-op Assessment: Report given to RN and Post -op Vital signs reviewed and stable  Post vital signs: Reviewed and stable  Last Vitals:  Vitals Value Taken Time  BP    Temp    Pulse 92 09/21/2018 11:25 AM  Resp 15 09/21/2018 11:25 AM  SpO2 99 % 09/21/2018 11:25 AM  Vitals shown include unvalidated device data.  Last Pain:  Vitals:   09/21/18 1015  TempSrc: Oral  PainSc: 0-No pain         Complications: No apparent anesthesia complications

## 2018-09-23 ENCOUNTER — Encounter (HOSPITAL_COMMUNITY): Payer: Self-pay | Admitting: Internal Medicine

## 2018-09-25 ENCOUNTER — Telehealth: Payer: Self-pay | Admitting: Internal Medicine

## 2018-09-25 NOTE — Telephone Encounter (Signed)
Actually, I mentioned reflux precautions including elevation of the head of bed at night and not eating too close to bedtime to avoid nocturnal regurgitation with coughing.  In terms of cough treatment, I would defer this to her PCP as this is outside the scope of my routine practice.  I am sorry if she misunderstood.  Thank you

## 2018-09-25 NOTE — Telephone Encounter (Signed)
Patient would like something to control her cough, states that Dr [name] had mentioned something but never prescribed it.

## 2018-09-25 NOTE — Telephone Encounter (Signed)
Dr. Perry please see note below and advise. 

## 2018-09-25 NOTE — Telephone Encounter (Signed)
Spoke with pt and she is aware, she has an upcoming appt with PCP and will discuss further at that visit.

## 2018-09-30 ENCOUNTER — Other Ambulatory Visit: Payer: Self-pay | Admitting: Internal Medicine

## 2018-10-06 ENCOUNTER — Other Ambulatory Visit: Payer: Self-pay | Admitting: Adult Health

## 2018-10-06 ENCOUNTER — Encounter: Payer: Self-pay | Admitting: Adult Health

## 2018-10-06 MED ORDER — CLONAZEPAM 0.5 MG PO TABS: ORAL_TABLET | ORAL | 0 refills | Status: DC

## 2018-10-06 MED ORDER — SERTRALINE HCL 100 MG PO TABS: 100.0000 mg | ORAL_TABLET | Freq: Every day | ORAL | 0 refills | Status: DC

## 2018-10-06 NOTE — Progress Notes (Deleted)
Patient presents to clinic today to establish care. She is a pleasant 82 year old female who  has a past medical history of Anemia, Anxiety, Bipolar disorder (HCC), Blood transfusion (1991), CAD (coronary artery disease), Cataract, Chest pain, Chronic back pain, COPD (chronic obstructive pulmonary disease) (HCC), Depression, Esophageal stricture, Fecal occult blood test positive, Fibromyalgia, GERD (gastroesophageal reflux disease), Heart murmur, Hiatal hernia, Hyperlipidemia, Hypertension, Internal hemorrhoids, Lumbago, Mitral regurgitation, Osteoarthritis, Osteoporosis, Pneumonia (~ 01/2018), PONV (postoperative nausea and vomiting), Rectal bleeding, Rheumatoid arthritis (HCC), Spondylosis, TIA (transient ischemic attack) (2013), and Urinary incontinence.  She is a previous patient of Dr. Kirtland Bouchard.   Acute Concerns: Establish Care  Chronic Issues: Essential  hypertension-takes Norvasc 2.5 mg by mouth and Hyzaar 100-25 mg daily  Anxiety/depression-takes Zoloft 100 mg daily, Remeron 15 mg nightly, and Klonopin 0.5 mg twice daily as needed  GERD- controlled with Protonix 40 mg daily   Osteoarthritis - takes Mobic 15 mg daily   Health Maintenance: Dental -- Vision -- Immunizations -- utd  Colonoscopy -- No longer needs Mammogram -- No longer needs PAP -- No longer needed Bone Density --    Past Medical History:  Diagnosis Date  . Anemia   . Anxiety   . Bipolar disorder (HCC)   . Blood transfusion 1991   autologous pts own blood given   . CAD (coronary artery disease)   . Cataract   . Chest pain    "@ rest, lying down, w/exertion"  . Chronic back pain    "mostly lower back but I do have upper back pain regularly" (04/06/2018)  . COPD (chronic obstructive pulmonary disease) (HCC)   . Depression   . Esophageal stricture   . Fecal occult blood test positive   . Fibromyalgia   . GERD (gastroesophageal reflux disease)   . Heart murmur    "slight" (04/06/2018)  . Hiatal hernia   .  Hyperlipidemia    Patient denies  . Hypertension   . Internal hemorrhoids   . Lumbago   . Mitral regurgitation   . Osteoarthritis   . Osteoporosis   . Pneumonia ~ 01/2018  . PONV (postoperative nausea and vomiting)    severe ponv, "in the past" (04/06/2018)  . Rectal bleeding   . Rheumatoid arthritis (HCC)   . Spondylosis   . TIA (transient ischemic attack) 2013  . Urinary incontinence    wears depends    Past Surgical History:  Procedure Laterality Date  . ABDOMINAL HYSTERECTOMY  1982  . BACK SURGERY    . BALLOON DILATION N/A 09/21/2018   Procedure: BALLOON DILATION;  Surgeon: Hilarie Fredrickson, MD;  Location: [facility];  Service: Endoscopy;  Laterality: N/A;  . BLADDER SUSPENSION  1980's  . BOTOX INJECTION N/A 09/21/2018   Procedure: BOTOX INJECTION;  Surgeon: Hilarie Fredrickson, MD;  Location: [facility];  Service: Endoscopy;  Laterality: N/A;  . CATARACT EXTRACTION W/ INTRAOCULAR LENS  IMPLANT, BILATERAL Bilateral 2010  . DILATION AND CURETTAGE OF UTERUS  1961  . ESOPHAGEAL MANOMETRY N/A 03/25/2018   Procedure: ESOPHAGEAL MANOMETRY (EM);  Surgeon: Napoleon Form, MD;  Location: [facility];  Service: Endoscopy;  Laterality: N/A;  . ESOPHAGOGASTRODUODENOSCOPY (EGD) WITH PROPOFOL N/A 04/07/2018   Procedure: ESOPHAGOGASTRODUODENOSCOPY (EGD) WITH PROPOFOL;  Surgeon: Sherrilyn Rist, MD;  Location: [facility];  Service: Gastroenterology;  Laterality: N/A;  . ESOPHAGOGASTRODUODENOSCOPY (EGD) WITH PROPOFOL N/A 09/21/2018   Procedure: ESOPHAGOGASTRODUODENOSCOPY (EGD) WITH PROPOFOL;  Surgeon: Hilarie Fredrickson, MD;  Location: [facility];  Service: Endoscopy;  Laterality: N/A;  . HERNIA REPAIR    . HIATAL HERNIA REPAIR N/A 08/02/2016   Procedure: LAPAROSCOPIC REPAIR OF LARGE  HIATAL HERNIA;  Surgeon: Glenna Fellows, MD;  Location: [facility];  Service: General;  Laterality: N/A;  . JOINT REPLACEMENT    . LAPAROSCOPIC NISSEN FUNDOPLICATION N/A 08/02/2016   Procedure: LAPAROSCOPIC NISSEN  FUNDOPLICATION;  Surgeon: Glenna Fellows, MD;  Location: [facility];  Service: General;  Laterality: N/A;  . LUMBAR LAMINECTOMY  1990; 1994; [id number]   "I've got 2 stainless steel rods; 6 screws; 2 ray cages"took bone from right hip to put in back  . TONSILLECTOMY AND ADENOIDECTOMY  1945  . TOTAL KNEE ARTHROPLASTY Right ~ 1996  . TUBAL LIGATION  ~ 1976    Current Outpatient Medications on File Prior to Visit  Medication Sig Dispense Refill  . acetaminophen (TYLENOL) 500 MG tablet Take 1,000 mg by mouth 2 (two) times daily as needed for moderate pain.    Marland Kitchen amLODipine (NORVASC) 2.5 MG tablet Take 1 tablet (2.5 mg total) by mouth daily. 90 tablet 3  . aspirin 81 MG tablet Take 1 tablet (81 mg total) by mouth daily. For cardiovascular health.    . clonazePAM (KLONOPIN) 0.5 MG tablet 1 p.o. twice daily as needed 42 tablet 0  . gabapentin (NEURONTIN) 100 MG capsule TAKE 1 CAPSULE BY MOUTH THREE TIMES A DAY (Patient taking differently: Take 100 mg by mouth 3 (three) times daily. ) 270 capsule 0  . losartan (COZAAR) 100 MG tablet TAKE 1 TABLET BY MOUTH EVERY DAY (Patient not taking: Reported on 09/15/2018) 30 tablet 0  . losartan-hydrochlorothiazide (HYZAAR) 100-25 MG tablet Take 1 tablet by mouth daily.    . meloxicam (MOBIC) 15 MG tablet Take 15 mg by mouth daily.  0  . mirtazapine (REMERON SOL-TAB) 15 MG disintegrating tablet TAKE 1 TABLET BY MOUTH EVERYDAY AT BEDTIME (Patient taking differently: Take 15 mg by mouth at bedtime. ) 90 tablet 1  . Multiple Vitamin (MULTIVITAMIN) tablet Take 1 tablet by mouth daily. Vitamin supplement.    . ondansetron (ZOFRAN) 4 MG tablet Take one tablet every 4-6 hours as needed for nausea 60 tablet 3  . pantoprazole (PROTONIX) 40 MG tablet Take 1 tablet (40 mg total) by mouth 2 (two) times daily. (Patient taking differently: Take 40 mg by mouth See admin instructions. Take 40 mg in the morning and may take a second 40 mg dose during the day as needed for heartburn) 60  tablet 3  . PRESCRIPTION MEDICATION Swish and spit 10 mLs 2 (two) times daily. LIDOCAIN/BENADRYL/MAALOX, 1:1:1  0  . sertraline (ZOLOFT) 100 MG tablet TAKE 1 TABLET BY MOUTH EVERY DAY (Patient taking differently: Take 100 mg by mouth at bedtime. ) 90 tablet 0  . sucralfate (CARAFATE) 1 g tablet Take 1 tablet (1 g total) by mouth 4 (four) times daily. Dissolve tablet in 10 ml warm water (Patient taking differently: Take 1 g by mouth 2 (two) times daily. Dissolve tablet in 10 ml warm water) 56 tablet 1  . Tetrahydrozoline HCl (VISINE OP) Place 1 drop into both eyes daily as needed (dry eyes).     No current facility-administered medications on file prior to visit.     Allergies  Allergen Reactions  . Morphine Other (See Comments)    Flushing, rash, itching  . Tape Other (See Comments)    Band aides, adhesive tape Redness and pulls skin off    Family History  Problem Relation Age  of Onset  . Kidney disease Mother   . Hypertension Mother   . Heart disease Father 74       MI  . Suicidality Son   . Colon cancer Neg Hx   . Esophageal cancer Neg Hx   . Pancreatic cancer Neg Hx   . Rectal cancer Neg Hx   . Stomach cancer Neg Hx     Social History   Socioeconomic History  . Marital status: Widowed    Spouse name: Not on file  . Number of children: 2  . Years of education: Not on file  . Highest education level: Not on file  Occupational History    Employer: RETIRED  Social Needs  . Financial resource strain: Not on file  . Food insecurity:    Worry: Not on file    Inability: Not on file  . Transportation needs:    Medical: Not on file    Non-medical: Not on file  Tobacco Use  . Smoking status: Never Smoker  . Smokeless tobacco: Never Used  Substance and Sexual Activity  . Alcohol use: Never    Frequency: Never  . Drug use: Never  . Sexual activity: Not Currently  Lifestyle  . Physical activity:    Days per week: Not on file    Minutes per session: Not on file  .  Stress: Not on file  Relationships  . Social connections:    Talks on phone: Not on file    Gets together: Not on file    Attends religious service: Not on file    Active member of club or organization: Not on file    Attends meetings of clubs or organizations: Not on file    Relationship status: Not on file  . Intimate partner violence:    Fear of current or ex partner: Not on file    Emotionally abused: Not on file    Physically abused: Not on file    Forced sexual activity: Not on file  Other Topics Concern  . Not on file  Social History Narrative  . Not on file    ROS  There were no vitals taken for this visit.  Physical Exam  Recent Results (from the past 2160 hour(s))  POC Urinalysis Dipstick     Status: Abnormal   Collection Time: 07/31/18 10:55 AM  Result Value Ref Range   Color, UA Yellow    Clarity, UA Cloudy    Glucose, UA Negative Negative   Bilirubin, UA Negative    Ketones, UA Negative    Spec Grav, UA <=1.005 (A) 1.010 - 1.025   Blood, UA Positive    pH, UA 8.5 (A) 5.0 - 8.0   Protein, UA Positive (A) Negative   Urobilinogen, UA 0.2 0.2 or 1.0 E.U./dL   Nitrite, UA Positive    Leukocytes, UA Large (3+) (A) Negative   Appearance     Odor    Culture, Urine     Status: Abnormal   Collection Time: 07/31/18 11:30 AM  Result Value Ref Range   MICRO NUMBER: [id number]    SPECIMEN QUALITY: ADEQUATE    Sample Source URINE    STATUS: FINAL    ISOLATE 1: Proteus mirabilis (A)     Comment: Greater than 100,000 CFU/mL of Proteus mirabilis      Susceptibility   Proteus mirabilis - URINE CULTURE, REFLEX    AMOX/CLAVULANIC <=2 Sensitive     AMPICILLIN <=2 Sensitive     AMPICILLIN/SULBACTAM <=2 Sensitive  CEFAZOLIN* <=4 Not Reportable      * For infections other than uncomplicated UTIcaused by E. coli, K. pneumoniae or P. mirabilis:Cefazolin is resistant if MIC > or = 8 mcg/mL.(Distinguishing susceptible versus intermediatefor isolates with MIC < or = 4  mcg/mL requiresadditional testing.)For uncomplicated UTI caused by E. coli,K. pneumoniae or P. mirabilis: Cefazolin issusceptible if MIC <32 mcg/mL and predictssusceptible to the oral agents cefaclor, cefdinir,cefpodoxime, cefprozil, cefuroxime, cephalexinand loracarbef.    CEFEPIME <=1 Sensitive     CEFTRIAXONE <=1 Sensitive     CIPROFLOXACIN <=0.25 Sensitive     LEVOFLOXACIN <=0.12 Sensitive     ERTAPENEM <=0.5 Sensitive     GENTAMICIN <=1 Sensitive     NITROFURANTOIN 128 Resistant     PIP/TAZO <=4 Sensitive     TOBRAMYCIN <=1 Sensitive     TRIMETH/SULFA* <=20 Sensitive      * For infections other than uncomplicated UTIcaused by E. coli, K. pneumoniae or P. mirabilis:Cefazolin is resistant if MIC > or = 8 mcg/mL.(Distinguishing susceptible versus intermediatefor isolates with MIC < or = 4 mcg/mL requiresadditional testing.)For uncomplicated UTI caused by E. coli,K. pneumoniae or P. mirabilis: Cefazolin issusceptible if MIC <32 mcg/mL and predictssusceptible to the oral agents cefaclor, cefdinir,cefpodoxime, cefprozil, cefuroxime, cephalexinand loracarbef.Legend:S = Susceptible  I = IntermediateR = Resistant  NS = Not susceptible* = Not tested  NR = Not reported**NN = See antimicrobic comments    Assessment/Plan: No problem-specific Assessment & Plan notes found for this encounter.

## 2018-10-13 ENCOUNTER — Other Ambulatory Visit: Payer: Self-pay | Admitting: Internal Medicine

## 2018-10-19 ENCOUNTER — Ambulatory Visit: Payer: Self-pay

## 2018-10-19 NOTE — Telephone Encounter (Signed)
Incoming call from Patient, with complaint of  Coughing at night mostly until  she vomits.  Blood pressure at 745am blood pressure was 170/ 91 HR 104  Blood Pressure during triage was 135/ 61  HR 88.  Values were obtained via automatic machine.  Patient states that she has not missed any doses of blood pressure medication.  Patient complains of numbness on bottom of feet, and  fibromyalgia. In the area of shoulders  arms and multiple areas.  Patient states that she has chronic SOB when doing  daily activities  around the house. Patient is in the process of transferring to Dr.  Dr.  Shirline Frees.   Has a pending New Patient appointment in January.  Patient requested  An appointment to help with reoccurring cough every night.  Reviewed care advice  with Patient.   Patient  Voiced understanding.  Patient scheduled for appointment, 10/20/18  At 5:45pm  With arrival at 5:30pm .  Patient voiced understanding.   Reason for Disposition . Systolic BP  >= 160 OR Diastolic >= 100  Answer Assessment - Initial Assessment Questions 1. BLOOD PRESSURE: "What is the blood pressure?" "Did you take at least two measurements 5 minutes apart?"    7:4am 170/91 2. ONSET: "When did you take your blood pressure?"     7:45 3. HOW: "How did you obtain the blood pressure?" (e.g., visiting nurse, automatic home BP monitor)    automatic 4. HISTORY: "Do you have a history of high blood pressure?"     yes 5. MEDICATIONS: "Are you taking any medications for blood pressure?" "Have you missed any doses recently?"     no 6. OTHER SYMPTOMS: "Do you have any symptoms?" (e.g., headache, chest pain, blurred vision, difficulty breathing, weakness)     Fibrmio   numbeness on bottom of feet.  SOB .   7. PREGNANCY: "Is there any chance you are pregnant?" "When was your last menstrual period?"      na  Protocols used: HIGH BLOOD PRESSURE-A-AH

## 2018-10-20 ENCOUNTER — Ambulatory Visit (INDEPENDENT_AMBULATORY_CARE_PROVIDER_SITE_OTHER): Payer: Medicare Other | Admitting: Adult Health

## 2018-10-20 ENCOUNTER — Other Ambulatory Visit: Payer: Self-pay | Admitting: Family Medicine

## 2018-10-20 ENCOUNTER — Ambulatory Visit (INDEPENDENT_AMBULATORY_CARE_PROVIDER_SITE_OTHER): Payer: Medicare Other

## 2018-10-20 ENCOUNTER — Encounter: Payer: Self-pay | Admitting: Adult Health

## 2018-10-20 VITALS — BP 116/60 | HR 90 | Temp 98.3°F | Resp 22

## 2018-10-20 DIAGNOSIS — R05 Cough: Secondary | ICD-10-CM | POA: Diagnosis not present

## 2018-10-20 DIAGNOSIS — J181 Lobar pneumonia, unspecified organism: Secondary | ICD-10-CM | POA: Diagnosis not present

## 2018-10-20 DIAGNOSIS — J189 Pneumonia, unspecified organism: Secondary | ICD-10-CM

## 2018-10-20 DIAGNOSIS — R059 Cough, unspecified: Secondary | ICD-10-CM

## 2018-10-20 DIAGNOSIS — R0602 Shortness of breath: Secondary | ICD-10-CM | POA: Diagnosis not present

## 2018-10-20 MED ORDER — IPRATROPIUM-ALBUTEROL 0.5-2.5 (3) MG/3ML IN SOLN: 3.0000 mL | Freq: Once | RESPIRATORY_TRACT | Status: DC

## 2018-10-20 MED ORDER — LEVOFLOXACIN 750 MG PO TABS: 750.0000 mg | ORAL_TABLET | Freq: Every day | ORAL | 0 refills | Status: AC

## 2018-10-20 MED ORDER — PREDNISONE 10 MG PO TABS: ORAL_TABLET | ORAL | 0 refills | Status: DC

## 2018-10-20 NOTE — Progress Notes (Signed)
Subjective:    Patient ID: [name], female    DOB: Feb 25, 1936, 82 y.o.   MRN: [id number]  HPI  82 year old female who  has a past medical history of Anemia, Anxiety, Bipolar disorder (HCC), Blood transfusion (1991), CAD (coronary artery disease), Cataract, Chest pain, Chronic back pain, COPD (chronic obstructive pulmonary disease) (HCC), Depression, Esophageal stricture, Fecal occult blood test positive, Fibromyalgia, GERD (gastroesophageal reflux disease), Heart murmur, Hiatal hernia, Hyperlipidemia, Hypertension, Internal hemorrhoids, Lumbago, Mitral regurgitation, Osteoarthritis, Osteoporosis, Pneumonia (~ 01/2018), PONV (postoperative nausea and vomiting), Rectal bleeding, Rheumatoid arthritis (HCC), Spondylosis, TIA (transient ischemic attack) (2013), and Urinary incontinence.  She presents to the office today for an acute complaint of productive cough, fatigue and shortness of breath for the last 10 days. She denies fever or chills.   She does endorse feeling acutely ill.   Review of Systems See HPI   Past Medical History:  Diagnosis Date  . Anemia   . Anxiety   . Bipolar disorder (HCC)   . Blood transfusion 1991   autologous pts own blood given   . CAD (coronary artery disease)   . Cataract   . Chest pain    "@ rest, lying down, w/exertion"  . Chronic back pain    "mostly lower back but I do have upper back pain regularly" (04/06/2018)  . COPD (chronic obstructive pulmonary disease) (HCC)   . Depression   . Esophageal stricture   . Fecal occult blood test positive   . Fibromyalgia   . GERD (gastroesophageal reflux disease)   . Heart murmur    "slight" (04/06/2018)  . Hiatal hernia   . Hyperlipidemia    Patient denies  . Hypertension   . Internal hemorrhoids   . Lumbago   . Mitral regurgitation   . Osteoarthritis   . Osteoporosis   . Pneumonia ~ 01/2018  . PONV (postoperative nausea and vomiting)    severe ponv, "in the past" (04/06/2018)  . Rectal bleeding     . Rheumatoid arthritis (HCC)   . Spondylosis   . TIA (transient ischemic attack) 2013  . Urinary incontinence    wears depends    Social History   Socioeconomic History  . Marital status: Widowed    Spouse name: Not on file  . Number of children: 2  . Years of education: Not on file  . Highest education level: Not on file  Occupational History    Employer: RETIRED  Social Needs  . Financial resource strain: Not on file  . Food insecurity:    Worry: Not on file    Inability: Not on file  . Transportation needs:    Medical: Not on file    Non-medical: Not on file  Tobacco Use  . Smoking status: Never Smoker  . Smokeless tobacco: Never Used  Substance and Sexual Activity  . Alcohol use: Never    Frequency: Never  . Drug use: Never  . Sexual activity: Not Currently  Lifestyle  . Physical activity:    Days per week: Not on file    Minutes per session: Not on file  . Stress: Not on file  Relationships  . Social connections:    Talks on phone: Not on file    Gets together: Not on file    Attends religious service: Not on file    Active member of club or organization: Not on file    Attends meetings of clubs or organizations: Not on file    Relationship  status: Not on file  . Intimate partner violence:    Fear of current or ex partner: Not on file    Emotionally abused: Not on file    Physically abused: Not on file    Forced sexual activity: Not on file  Other Topics Concern  . Not on file  Social History Narrative  . Not on file    Past Surgical History:  Procedure Laterality Date  . ABDOMINAL HYSTERECTOMY  1982  . BACK SURGERY    . BALLOON DILATION N/A 09/21/2018   Procedure: BALLOON DILATION;  Surgeon: Hilarie Fredrickson, MD;  Location: [facility];  Service: Endoscopy;  Laterality: N/A;  . BLADDER SUSPENSION  1980's  . BOTOX INJECTION N/A 09/21/2018   Procedure: BOTOX INJECTION;  Surgeon: Hilarie Fredrickson, MD;  Location: [facility];  Service: Endoscopy;   Laterality: N/A;  . CATARACT EXTRACTION W/ INTRAOCULAR LENS  IMPLANT, BILATERAL Bilateral 2010  . DILATION AND CURETTAGE OF UTERUS  1961  . ESOPHAGEAL MANOMETRY N/A 03/25/2018   Procedure: ESOPHAGEAL MANOMETRY (EM);  Surgeon: Napoleon Form, MD;  Location: [facility];  Service: Endoscopy;  Laterality: N/A;  . ESOPHAGOGASTRODUODENOSCOPY (EGD) WITH PROPOFOL N/A 04/07/2018   Procedure: ESOPHAGOGASTRODUODENOSCOPY (EGD) WITH PROPOFOL;  Surgeon: Sherrilyn Rist, MD;  Location: [facility]COPY;  Service: Gastroenterology;  Laterality: N/A;  . ESOPHAGOGASTRODUODENOSCOPY (EGD) WITH PROPOFOL N/A 09/21/2018   Procedure: ESOPHAGOGASTRODUODENOSCOPY (EGD) WITH PROPOFOL;  Surgeon: Hilarie Fredrickson, MD;  Location: [facility];  Service: Endoscopy;  Laterality: N/A;  . HERNIA REPAIR    . HIATAL HERNIA REPAIR N/A 08/02/2016   Procedure: LAPAROSCOPIC REPAIR OF LARGE  HIATAL HERNIA;  Surgeon: Glenna Fellows, MD;  Location: [facility];  Service: General;  Laterality: N/A;  . JOINT REPLACEMENT    . LAPAROSCOPIC NISSEN FUNDOPLICATION N/A 08/02/2016   Procedure: LAPAROSCOPIC NISSEN FUNDOPLICATION;  Surgeon: Glenna Fellows, MD;  Location: [facility];  Service: General;  Laterality: N/A;  . LUMBAR LAMINECTOMY  1990; 1994; [id number]   "I've got 2 stainless steel rods; 6 screws; 2 ray cages"took bone from right hip to put in back  . TONSILLECTOMY AND ADENOIDECTOMY  1945  . TOTAL KNEE ARTHROPLASTY Right ~ 1996  . TUBAL LIGATION  ~ 1976    Family History  Problem Relation Age of Onset  . Kidney disease Mother   . Hypertension Mother   . Heart disease Father 58       MI  . Suicidality Son   . Colon cancer Neg Hx   . Esophageal cancer Neg Hx   . Pancreatic cancer Neg Hx   . Rectal cancer Neg Hx   . Stomach cancer Neg Hx     Allergies  Allergen Reactions  . Morphine Other (See Comments)    Flushing, rash, itching  . Tape Other (See Comments)    Band aides, adhesive tape Redness and pulls skin off     Current Outpatient Medications on File Prior to Visit  Medication Sig Dispense Refill  . acetaminophen (TYLENOL) 500 MG tablet Take 1,000 mg by mouth 2 (two) times daily as needed for moderate pain.    Marland Kitchen amLODipine (NORVASC) 2.5 MG tablet Take 1 tablet (2.5 mg total) by mouth daily. 90 tablet 3  . aspirin 81 MG tablet Take 1 tablet (81 mg total) by mouth daily. For cardiovascular health.    . clonazePAM (KLONOPIN) 0.5 MG tablet 1 p.o. twice daily as needed 42 tablet 0  . gabapentin (NEURONTIN) 100 MG capsule TAKE 1 CAPSULE BY  MOUTH THREE TIMES A DAY (Patient taking differently: Take 100 mg by mouth 3 (three) times daily. ) 270 capsule 0  . losartan (COZAAR) 100 MG tablet TAKE 1 TABLET BY MOUTH EVERY DAY (Patient not taking: Reported on 09/15/2018) 30 tablet 0  . losartan-hydrochlorothiazide (HYZAAR) 100-25 MG tablet Take 1 tablet by mouth daily.    . meloxicam (MOBIC) 15 MG tablet Take 15 mg by mouth daily.  0  . mirtazapine (REMERON SOL-TAB) 15 MG disintegrating tablet TAKE 1 TABLET BY MOUTH EVERYDAY AT BEDTIME (Patient taking differently: Take 15 mg by mouth at bedtime. ) 90 tablet 1  . Multiple Vitamin (MULTIVITAMIN) tablet Take 1 tablet by mouth daily. Vitamin supplement.    . ondansetron (ZOFRAN) 4 MG tablet Take one tablet every 4-6 hours as needed for nausea 60 tablet 3  . pantoprazole (PROTONIX) 40 MG tablet Take 1 tablet (40 mg total) by mouth 2 (two) times daily. (Patient taking differently: Take 40 mg by mouth See admin instructions. Take 40 mg in the morning and may take a second 40 mg dose during the day as needed for heartburn) 60 tablet 3  . PRESCRIPTION MEDICATION Swish and spit 10 mLs 2 (two) times daily. LIDOCAIN/BENADRYL/MAALOX, 1:1:1  0  . sertraline (ZOLOFT) 100 MG tablet Take 1 tablet (100 mg total) by mouth at bedtime. 30 tablet 0  . sucralfate (CARAFATE) 1 g tablet Take 1 tablet (1 g total) by mouth 4 (four) times daily. Dissolve tablet in 10 ml warm water (Patient  taking differently: Take 1 g by mouth 2 (two) times daily. Dissolve tablet in 10 ml warm water) 56 tablet 1  . Tetrahydrozoline HCl (VISINE OP) Place 1 drop into both eyes daily as needed (dry eyes).     No current facility-administered medications on file prior to visit.     BP 116/60   Pulse 90   Temp 98.3 F (36.8 C)   Resp (!) 22   SpO2 96%       Objective:   Physical Exam Vitals signs reviewed.  Constitutional:      Appearance: Normal appearance. She is ill-appearing.  Cardiovascular:     Rate and Rhythm: Normal rate and regular rhythm.     Pulses: Normal pulses.     Heart sounds: Normal heart sounds.  Pulmonary:     Effort: Pulmonary effort is normal.     Breath sounds: Decreased air movement present. Examination of the right-upper field reveals decreased breath sounds. Examination of the right-middle field reveals decreased breath sounds. Decreased breath sounds present.  Neurological:     General: No focal deficit present.     Mental Status: She is alert and oriented to person, place, and time. Mental status is at baseline.  Psychiatric:        Mood and Affect: Mood normal.        Behavior: Behavior normal.        Thought Content: Thought content normal.        Judgment: Judgment normal.       Assessment & Plan:  1. Shortness of breath  - DG Chest 2 View; Future - EKG 12-Lead- Sinus  Rhythm  -Left bundle branch block and left axis, rate 90. Consistent with previous EKG's   2. Pneumonia of right middle lobe due to infectious organism (HCC) Stat EKG showed  1. Acute pneumonia involving the RIGHT LOWER LOBE superimposed upon mild changes of chronic bronchitis and/or asthma. 2. Stable cardiomegaly without pulmonary edema.   - EKG  12-Lead - levofloxacin (LEVAQUIN) 750 MG tablet; Take 1 tablet (750 mg total) by mouth daily for 7 days.  Dispense: 7 tablet; Refill: 0 - predniSONE (DELTASONE) 10 MG tablet; 40 mg x 3 days, 20 mg x 3 days, 10 mg x 3 days  Dispense:  21 tablet; Refill: 0 - ipratropium-albuterol (DUONEB) 0.5-2.5 (3) MG/3ML nebulizer solution 3 mL - Advised to follow up in 2-3 days if not improving or to go the ER if symptoms become worse  Shirline Frees, NP

## 2018-10-30 DIAGNOSIS — M25562 Pain in left knee: Secondary | ICD-10-CM | POA: Diagnosis not present

## 2018-10-30 DIAGNOSIS — M1712 Unilateral primary osteoarthritis, left knee: Secondary | ICD-10-CM | POA: Diagnosis not present

## 2018-11-03 ENCOUNTER — Other Ambulatory Visit: Payer: Self-pay | Admitting: Adult Health

## 2018-11-05 ENCOUNTER — Telehealth: Payer: Self-pay | Admitting: Adult Health

## 2018-11-05 ENCOUNTER — Ambulatory Visit (INDEPENDENT_AMBULATORY_CARE_PROVIDER_SITE_OTHER): Payer: Medicare Other

## 2018-11-05 ENCOUNTER — Encounter: Payer: Self-pay | Admitting: Adult Health

## 2018-11-05 ENCOUNTER — Ambulatory Visit: Payer: Medicare Other | Admitting: Adult Health

## 2018-11-05 VITALS — BP 146/72 | HR 81 | Temp 97.4°F | Wt 153.0 lb

## 2018-11-05 DIAGNOSIS — J181 Lobar pneumonia, unspecified organism: Secondary | ICD-10-CM

## 2018-11-05 DIAGNOSIS — Z7689 Persons encountering health services in other specified circumstances: Secondary | ICD-10-CM

## 2018-11-05 DIAGNOSIS — F411 Generalized anxiety disorder: Secondary | ICD-10-CM

## 2018-11-05 DIAGNOSIS — J189 Pneumonia, unspecified organism: Secondary | ICD-10-CM

## 2018-11-05 DIAGNOSIS — F3340 Major depressive disorder, recurrent, in remission, unspecified: Secondary | ICD-10-CM | POA: Diagnosis not present

## 2018-11-05 DIAGNOSIS — Z760 Encounter for issue of repeat prescription: Secondary | ICD-10-CM

## 2018-11-05 DIAGNOSIS — I10 Essential (primary) hypertension: Secondary | ICD-10-CM

## 2018-11-05 MED ORDER — MELOXICAM 15 MG PO TABS: 15.0000 mg | ORAL_TABLET | Freq: Every day | ORAL | 3 refills | Status: DC

## 2018-11-05 MED ORDER — LOSARTAN POTASSIUM-HCTZ 100-25 MG PO TABS: 1.0000 | ORAL_TABLET | Freq: Every day | ORAL | 3 refills | Status: DC

## 2018-11-05 MED ORDER — LEVOFLOXACIN 750 MG PO TABS: 750.0000 mg | ORAL_TABLET | Freq: Every day | ORAL | 0 refills | Status: DC

## 2018-11-05 MED ORDER — CLONAZEPAM 0.5 MG PO TABS: 0.5000 mg | ORAL_TABLET | Freq: Two times a day (BID) | ORAL | 2 refills | Status: DC | PRN

## 2018-11-05 NOTE — Telephone Encounter (Signed)
Updated patient on her chest x-ray.  Right lower lobe pneumonia appears to be resolving but has not completely cleared at this point.  She is okay with another 5 days of Levaquin.

## 2018-11-05 NOTE — Progress Notes (Addendum)
Patient presents to clinic today to establish care. She is a pleasant 83 year old female who  has a past medical history of Anemia, Anxiety, Bipolar disorder (HCC), Blood transfusion (1991), CAD (coronary artery disease), Cataract, Chest pain, Chronic back pain, COPD (chronic obstructive pulmonary disease) (HCC), Depression, Esophageal stricture, Fecal occult blood test positive, Fibromyalgia, GERD (gastroesophageal reflux disease), Heart murmur, Hiatal hernia, Hyperlipidemia, Hypertension, Internal hemorrhoids, Lumbago, Mitral regurgitation, Osteoarthritis, Osteoporosis, Pneumonia (~ 01/2018), PONV (postoperative nausea and vomiting), Rectal bleeding, Rheumatoid arthritis (HCC), Spondylosis, TIA (transient ischemic attack) (2013), and Urinary incontinence.   She is a former patient of Dr. Kirtland BouchardK  Her last CPE was in June 2018   Acute Concerns: Establish Care  Pneumonia Right Lower Lobe -was diagnosed 10/20/2018 by this Clinical research associatewriter.  She was subsequently started on Levaquin 750 mg daily, and a prednisone course. She reports that she feels improved but continues to have some shortness of breath.   Chronic Issues: Essential hypertension currently prescribed Norvasc 2.5 mg and Hyzaar 100-25 mg. She reports good compliance with her medications. Has instances of lightheadedness and dizziness but this does not happen often.  BP Readings from Last 3 Encounters:  11/05/18 (!) 146/72  10/20/18 116/60  09/21/18 (!) 164/79   Fibromyalgia -takes gabapentin 100 mg 3 times a day and feels as though this is helpful.   Anxiety and depression-Controlled with Zoloft 100 mg at daily and Remeron 15 mg nightly.  She is also prescribed Klonopin 0.5 mg twice daily PRN.   Osteoporosis - Takes Mobic 15 mg   GERD - takes Protonix 40 mg daily and sometimes at night if GERD is bad. Is followed by Dr. Allyson SabalBerry    Hyperlipidemia - not currently on statin   Health Maintenance: Dental --Routine Care Vision -- Routine Care -  Dr. Hazle Quantigby Immunizations -- utd Colonoscopy -- No longer needs Mammogram -- last was 2015.  PAP -- no longer needs  Bone Density --   GI - Dr. Allyson SabalBerry  Cardiology - Dr. Eden EmmsNishan.    Past Medical History:  Diagnosis Date  . Anemia   . Anxiety   . Bipolar disorder (HCC)   . Blood transfusion 1991   autologous pts own blood given   . CAD (coronary artery disease)   . Cataract   . Chest pain    "@ rest, lying down, w/exertion"  . Chronic back pain    "mostly lower back but I do have upper back pain regularly" (04/06/2018)  . COPD (chronic obstructive pulmonary disease) (HCC)   . Depression   . Esophageal stricture   . Fecal occult blood test positive   . Fibromyalgia   . GERD (gastroesophageal reflux disease)   . Heart murmur    "slight" (04/06/2018)  . Hiatal hernia   . Hyperlipidemia    Patient denies  . Hypertension   . Internal hemorrhoids   . Lumbago   . Mitral regurgitation   . Osteoarthritis   . Osteoporosis   . Pneumonia ~ 01/2018  . PONV (postoperative nausea and vomiting)    severe ponv, "in the past" (04/06/2018)  . Rectal bleeding   . Rheumatoid arthritis (HCC)   . Spondylosis   . TIA (transient ischemic attack) 2013  . Urinary incontinence    wears depends    Past Surgical History:  Procedure Laterality Date  . ABDOMINAL HYSTERECTOMY  1982  . BACK SURGERY    . BALLOON DILATION N/A 09/21/2018   Procedure: BALLOON DILATION;  Surgeon: Hilarie FredricksonPerry, John N,  MD;  Location: [facility];  Service: Endoscopy;  Laterality: N/A;  . BLADDER SUSPENSION  1980's  . BOTOX INJECTION N/A 09/21/2018   Procedure: BOTOX INJECTION;  Surgeon: Hilarie Fredrickson, MD;  Location: [facility];  Service: Endoscopy;  Laterality: N/A;  . CATARACT EXTRACTION W/ INTRAOCULAR LENS  IMPLANT, BILATERAL Bilateral 2010  . DILATION AND CURETTAGE OF UTERUS  1961  . ESOPHAGEAL MANOMETRY N/A 03/25/2018   Procedure: ESOPHAGEAL MANOMETRY (EM);  Surgeon: Napoleon Form, MD;  Location: [facility];   Service: Endoscopy;  Laterality: N/A;  . ESOPHAGOGASTRODUODENOSCOPY (EGD) WITH PROPOFOL N/A 04/07/2018   Procedure: ESOPHAGOGASTRODUODENOSCOPY (EGD) WITH PROPOFOL;  Surgeon: Sherrilyn Rist, MD;  Location: [facility];  Service: Gastroenterology;  Laterality: N/A;  . ESOPHAGOGASTRODUODENOSCOPY (EGD) WITH PROPOFOL N/A 09/21/2018   Procedure: ESOPHAGOGASTRODUODENOSCOPY (EGD) WITH PROPOFOL;  Surgeon: Hilarie Fredrickson, MD;  Location: [facility];  Service: Endoscopy;  Laterality: N/A;  . HERNIA REPAIR    . HIATAL HERNIA REPAIR N/A 08/02/2016   Procedure: LAPAROSCOPIC REPAIR OF LARGE  HIATAL HERNIA;  Surgeon: Glenna Fellows, MD;  Location: [facility];  Service: General;  Laterality: N/A;  . JOINT REPLACEMENT    . LAPAROSCOPIC NISSEN FUNDOPLICATION N/A 08/02/2016   Procedure: LAPAROSCOPIC NISSEN FUNDOPLICATION;  Surgeon: Glenna Fellows, MD;  Location: [facility];  Service: General;  Laterality: N/A;  . LUMBAR LAMINECTOMY  1990; 1994; 2094;7096   "I've got 2 stainless steel rods; 6 screws; 2 ray cages"took bone from right hip to put in back  . TONSILLECTOMY AND ADENOIDECTOMY  1945  . TOTAL KNEE ARTHROPLASTY Right ~ 1996  . TUBAL LIGATION  ~ 1976    Current Outpatient Medications on File Prior to Visit  Medication Sig Dispense Refill  . acetaminophen (TYLENOL) 500 MG tablet Take 1,000 mg by mouth 2 (two) times daily as needed for moderate pain.    Marland Kitchen amLODipine (NORVASC) 2.5 MG tablet Take 1 tablet (2.5 mg total) by mouth daily. 90 tablet 3  . aspirin 81 MG tablet Take 1 tablet (81 mg total) by mouth daily. For cardiovascular health.    . gabapentin (NEURONTIN) 100 MG capsule TAKE 1 CAPSULE BY MOUTH THREE TIMES A DAY (Patient taking differently: Take 100 mg by mouth 3 (three) times daily. ) 270 capsule 0  . mirtazapine (REMERON SOL-TAB) 15 MG disintegrating tablet TAKE 1 TABLET BY MOUTH EVERYDAY AT BEDTIME (Patient taking differently: Take 15 mg by mouth at bedtime. ) 90 tablet 1  . Multiple Vitamin  (MULTIVITAMIN) tablet Take 1 tablet by mouth daily. Vitamin supplement.    . ondansetron (ZOFRAN) 4 MG tablet Take one tablet every 4-6 hours as needed for nausea 60 tablet 3  . pantoprazole (PROTONIX) 40 MG tablet Take 1 tablet (40 mg total) by mouth 2 (two) times daily. (Patient taking differently: Take 40 mg by mouth See admin instructions. Take 40 mg in the morning and may take a second 40 mg dose during the day as needed for heartburn) 60 tablet 3  . PRESCRIPTION MEDICATION Swish and spit 10 mLs 2 (two) times daily. LIDOCAIN/BENADRYL/MAALOX, 1:1:1  0  . sertraline (ZOLOFT) 100 MG tablet TAKE 1 TABLET BY MOUTH EVERYDAY AT BEDTIME 90 tablet 1  . sucralfate (CARAFATE) 1 g tablet Take 1 tablet (1 g total) by mouth 4 (four) times daily. Dissolve tablet in 10 ml warm water (Patient taking differently: Take 1 g by mouth 2 (two) times daily. Dissolve tablet in 10 ml warm water) 56 tablet 1  . Tetrahydrozoline HCl (VISINE  OP) Place 1 drop into both eyes daily as needed (dry eyes).     Current Facility-Administered Medications on File Prior to Visit  Medication Dose Route Frequency Provider Last Rate Last Dose  . ipratropium-albuterol (DUONEB) 0.5-2.5 (3) MG/3ML nebulizer solution 3 mL  3 mL Nebulization Once [name], Quashawn Jewkes, NP        Allergies  Allergen Reactions  . Morphine Other (See Comments)    Flushing, rash, itching  . Tape Other (See Comments)    Band aides, adhesive tape Redness and pulls skin off    Family History  Problem Relation Age of Onset  . Kidney disease Mother   . Hypertension Mother   . Heart disease Father [id number]       MI  . Suicidality Son   . Colon cancer Neg Hx   . Esophageal cancer Neg Hx   . Pancreatic cancer Neg Hx   . Rectal cancer Neg Hx   . Stomach cancer Neg Hx     Social History   Socioeconomic History  . Marital status: Widowed    Spouse name: Not on file  . Number of children: 2  . Years of education: Not on file  . Highest education level: Not on  file  Occupational History    Employer: RETIRED  Social Needs  . Financial resource strain: Not on file  . Food insecurity:    Worry: Not on file    Inability: Not on file  . Transportation needs:    Medical: Not on file    Non-medical: Not on file  Tobacco Use  . Smoking status: Never Smoker  . Smokeless tobacco: Never Used  Substance and Sexual Activity  . Alcohol use: Never    Frequency: Never  . Drug use: Never  . Sexual activity: Not Currently  Lifestyle  . Physical activity:    Days per week: Not on file    Minutes per session: Not on file  . Stress: Not on file  Relationships  . Social connections:    Talks on phone: Not on file    Gets together: Not on file    Attends religious service: Not on file    Active member of club or organization: Not on file    Attends meetings of clubs or organizations: Not on file    Relationship status: Not on file  . Intimate partner violence:    Fear of current or ex partner: Not on file    Emotionally abused: Not on file    Physically abused: Not on file    Forced sexual activity: Not on file  Other Topics Concern  . Not on file  Social History Narrative   Retired    Widowed    Review of Systems  Constitutional: Negative.   Respiratory: Positive for shortness of breath.   Cardiovascular: Negative.   Genitourinary: Negative.   Musculoskeletal: Positive for back pain and joint pain.  Skin: Negative.   Neurological: Negative.   All other systems reviewed and are negative.     BP (!) 146/72   Pulse 81   Temp (!) 97.4 F (36.3 C)   Wt 153 lb (69.4 kg)   SpO2 98%   BMI 25.86 kg/m   Physical Exam Vitals signs and nursing note reviewed.  Constitutional:      Appearance: She is normal weight.  HENT:     Right Ear: Tympanic membrane, ear canal and external ear normal.     Left Ear: Tympanic membrane, ear canal  and external ear normal.     Mouth/Throat:     Mouth: Mucous membranes are moist.     Pharynx:  Oropharynx is clear.  Eyes:     Pupils: Pupils are equal, round, and reactive to light.  Cardiovascular:     Rate and Rhythm: Normal rate and regular rhythm.     Heart sounds: Murmur present.  Pulmonary:     Effort: Pulmonary effort is normal.     Breath sounds: Examination of the right-lower field reveals decreased breath sounds and wheezing. Decreased breath sounds and wheezing present.  Skin:    General: Skin is warm and dry.     Capillary Refill: Capillary refill takes less than 2 seconds.  Neurological:     General: No focal deficit present.     Mental Status: She is alert and oriented to person, place, and time.  Psychiatric:        Mood and Affect: Mood normal.        Behavior: Behavior normal.        Thought Content: Thought content normal.        Judgment: Judgment normal.     Assessment/Plan: 1. Encounter to establish care - Follow up in June for CPE or sooner if needed  2. Pneumonia of left lower lobe due to infectious organism (HCC) - Continues to have decreased breath sounds in left lower lobe. Will recheck chest xray and possible need to add some additional abx - DG Chest 2 View; Future  3. Recurrent major depressive disorder, in remission (HCC) - Continue with current regimen   4. Generalized anxiety disorder - Continue with current regimen   5. Essential hypertension - At goal for age  - No change in medications   6. Medication refill  - meloxicam (MOBIC) 15 MG tablet; Take 1 tablet (15 mg total) by mouth daily.  Dispense: 90 tablet; Refill: 3 - losartan-hydrochlorothiazide (HYZAAR) 100-25 MG tablet; Take 1 tablet by mouth daily.  Dispense: 90 tablet; Refill: 3 - clonazePAM (KLONOPIN) 0.5 MG tablet; Take 1 tablet (0.5 mg total) by mouth 2 (two) times daily as needed for anxiety. 1 p.o. twice daily as needed  Dispense: 60 tablet; Refill: 2   Shirline Frees, NP

## 2018-11-12 ENCOUNTER — Other Ambulatory Visit: Payer: Self-pay | Admitting: *Deleted

## 2018-11-20 ENCOUNTER — Telehealth: Payer: Self-pay | Admitting: Internal Medicine

## 2018-11-20 NOTE — Telephone Encounter (Signed)
Pls call pt, she needs to schedule a f/u botox injection.

## 2018-11-20 NOTE — Telephone Encounter (Signed)
Pt scheduled for follow-up OV with Dr. [name] 12/11/18 @1 :30pm. Pt aware of appt.

## 2018-12-01 ENCOUNTER — Encounter: Payer: Self-pay | Admitting: Adult Health

## 2018-12-01 ENCOUNTER — Ambulatory Visit: Payer: Medicare Other | Admitting: Adult Health

## 2018-12-01 ENCOUNTER — Ambulatory Visit (INDEPENDENT_AMBULATORY_CARE_PROVIDER_SITE_OTHER): Payer: Medicare Other

## 2018-12-01 VITALS — BP 150/82 | HR 100 | Temp 98.0°F | Wt 152.0 lb

## 2018-12-01 DIAGNOSIS — R0602 Shortness of breath: Secondary | ICD-10-CM

## 2018-12-01 DIAGNOSIS — M8949 Other hypertrophic osteoarthropathy, multiple sites: Secondary | ICD-10-CM

## 2018-12-01 DIAGNOSIS — M159 Polyosteoarthritis, unspecified: Secondary | ICD-10-CM

## 2018-12-01 DIAGNOSIS — M150 Primary generalized (osteo)arthritis: Secondary | ICD-10-CM | POA: Diagnosis not present

## 2018-12-01 DIAGNOSIS — J439 Emphysema, unspecified: Secondary | ICD-10-CM | POA: Diagnosis not present

## 2018-12-01 NOTE — Progress Notes (Signed)
Subjective:    Patient ID: [name], female    DOB: 11-Apr-1936, 83 y.o.   MRN: [id number]  HPI 83 year old female who  has a past medical history of Anemia, Anxiety, Bipolar disorder (HCC), Blood transfusion (1991), CAD (coronary artery disease), Cataract, Chest pain, Chronic back pain, COPD (chronic obstructive pulmonary disease) (HCC), Depression, Esophageal stricture, Fecal occult blood test positive, Fibromyalgia, GERD (gastroesophageal reflux disease), Heart murmur, Hiatal hernia, Hyperlipidemia, Hypertension, Internal hemorrhoids, Lumbago, Mitral regurgitation, Osteoarthritis, Osteoporosis, Pneumonia (~ 01/2018), PONV (postoperative nausea and vomiting), Rectal bleeding, Rheumatoid arthritis (HCC), Spondylosis, TIA (transient ischemic attack) (2013), and Urinary incontinence.  Presents to the office today for follow-up regarding multiple chronic issues.  1. SOB -this is a longstanding issue and was recently treated with 2 courses of Levaquin for left lower lobe pneumonia.  She continues to complain of somewhat worsening shortness of breath especially apparent with exertion.  She has been seen by pulmonary in the past and has a remote history of COPD.  She does not want to return to pulmonary at this time.  Along with the shortness of breath she continues to complain of nonproductive cough.  She denies any chills, fevers, or chest pain.  Does not feel as though she is wheezing.  2. Chronic Back and knee pain -again, this is a longstanding issue.  His history of osteoarthritis and lumbar laminectomy, rheumatoid arthritis, and questionable history of fibromyalgia.  She is currently prescribed Mobic 15 mg daily.  She reports that back pain seems to be getting worse, especially apparent with movement and ambulation.  She does not feel as though the Mobic is working well, does not want to have surgery.   Review of Systems See HPI   Past Medical History:  Diagnosis Date  . Anemia   .  Anxiety   . Bipolar disorder (HCC)   . Blood transfusion 1991   autologous pts own blood given   . CAD (coronary artery disease)   . Cataract   . Chest pain    "@ rest, lying down, w/exertion"  . Chronic back pain    "mostly lower back but I do have upper back pain regularly" (04/06/2018)  . COPD (chronic obstructive pulmonary disease) (HCC)   . Depression   . Esophageal stricture   . Fecal occult blood test positive   . Fibromyalgia   . GERD (gastroesophageal reflux disease)   . Heart murmur    "slight" (04/06/2018)  . Hiatal hernia   . Hyperlipidemia    Patient denies  . Hypertension   . Internal hemorrhoids   . Lumbago   . Mitral regurgitation   . Osteoarthritis   . Osteoporosis   . Pneumonia ~ 01/2018  . PONV (postoperative nausea and vomiting)    severe ponv, "in the past" (04/06/2018)  . Rectal bleeding   . Rheumatoid arthritis (HCC)   . Spondylosis   . TIA (transient ischemic attack) 2013  . Urinary incontinence    wears depends    Social History   Socioeconomic History  . Marital status: Widowed    Spouse name: Not on file  . Number of children: 2  . Years of education: Not on file  . Highest education level: Not on file  Occupational History    Employer: RETIRED  Social Needs  . Financial resource strain: Not on file  . Food insecurity:    Worry: Not on file    Inability: Not on file  . Transportation needs:  Medical: Not on file    Non-medical: Not on file  Tobacco Use  . Smoking status: Never Smoker  . Smokeless tobacco: Never Used  Substance and Sexual Activity  . Alcohol use: Never    Frequency: Never  . Drug use: Never  . Sexual activity: Not Currently  Lifestyle  . Physical activity:    Days per week: Not on file    Minutes per session: Not on file  . Stress: Not on file  Relationships  . Social connections:    Talks on phone: Not on file    Gets together: Not on file    Attends religious service: Not on file    Active member of club  or organization: Not on file    Attends meetings of clubs or organizations: Not on file    Relationship status: Not on file  . Intimate partner violence:    Fear of current or ex partner: Not on file    Emotionally abused: Not on file    Physically abused: Not on file    Forced sexual activity: Not on file  Other Topics Concern  . Not on file  Social History Narrative   Retired    Widowed    Past Surgical History:  Procedure Laterality Date  . ABDOMINAL HYSTERECTOMY  1982  . BACK SURGERY    . BALLOON DILATION N/A 09/21/2018   Procedure: BALLOON DILATION;  Surgeon: Hilarie FredricksonPerry, John N, MD;  [facility];  Service: Endoscopy;  Laterality: N/A;  . BLADDER SUSPENSION  1980's  . BOTOX INJECTION N/A 09/21/2018   Procedure: BOTOX INJECTION;  Surgeon: Hilarie FredricksonPerry, John N, MD;  Location: [facility];  Service: Endoscopy;  Laterality: N/A;  . CATARACT EXTRACTION W/ INTRAOCULAR LENS  IMPLANT, BILATERAL Bilateral 2010  . DILATION AND CURETTAGE OF UTERUS  1961  . ESOPHAGEAL MANOMETRY N/A 03/25/2018   Procedure: ESOPHAGEAL MANOMETRY (EM);  Surgeon: Napoleon FormNandigam, Kavitha V, MD;  Location: [facility];  Service: Endoscopy;  Laterality: N/A;  . ESOPHAGOGASTRODUODENOSCOPY (EGD) WITH PROPOFOL N/A 04/07/2018   Procedure: ESOPHAGOGASTRODUODENOSCOPY (EGD) WITH PROPOFOL;  Surgeon: Sherrilyn Ristanis, Henry L III, MD;  [facility];  Service: Gastroenterology;  Laterality: N/A;  . ESOPHAGOGASTRODUODENOSCOPY (EGD) WITH PROPOFOL N/A 09/21/2018   Procedure: ESOPHAGOGASTRODUODENOSCOPY (EGD) WITH PROPOFOL;  Surgeon: Hilarie FredricksonPerry, John N, MD;  Location: [facility];  Service: Endoscopy;  Laterality: N/A;  . HERNIA REPAIR    . HIATAL HERNIA REPAIR N/A 08/02/2016   Procedure: LAPAROSCOPIC REPAIR OF LARGE  HIATAL HERNIA;  Surgeon: Glenna FellowsBenjamin Hoxworth, MD;  Location: [facility];  Service: General;  Laterality: N/A;  . JOINT REPLACEMENT    . LAPAROSCOPIC NISSEN FUNDOPLICATION N/A 08/02/2016   Procedure: LAPAROSCOPIC NISSEN  FUNDOPLICATION;  Surgeon: Glenna FellowsBenjamin Hoxworth, MD;  Location: [facility];  Service: General;  Laterality: N/A;  . LUMBAR LAMINECTOMY  1990; 1994; 1610;9604; 1997;2000   "I've got 2 stainless steel rods; 6 screws; 2 ray cages"took bone from right hip to put in back  . TONSILLECTOMY AND ADENOIDECTOMY  1945  . TOTAL KNEE ARTHROPLASTY Right ~ 1996  . TUBAL LIGATION  ~ 1976    Family History  Problem Relation Age of Onset  . Kidney disease Mother   . Hypertension Mother   . Heart disease Father [id number]       MI  . Suicidality Son   . Colon cancer Neg Hx   . Esophageal cancer Neg Hx   . Pancreatic cancer Neg Hx   . Rectal cancer Neg Hx   . Stomach cancer Neg  Hx     Allergies  Allergen Reactions  . Morphine Other (See Comments)    Flushing, rash, itching  . Tape Other (See Comments)    Band aides, adhesive tape Redness and pulls skin off    Current Outpatient Medications on File Prior to Visit  Medication Sig Dispense Refill  . acetaminophen (TYLENOL) 500 MG tablet Take 1,000 mg by mouth 2 (two) times daily as needed for moderate pain.    Marland Kitchen amLODipine (NORVASC) 2.5 MG tablet Take 1 tablet (2.5 mg total) by mouth daily. 90 tablet 3  . aspirin 81 MG tablet Take 1 tablet (81 mg total) by mouth daily. For cardiovascular health.    . clonazePAM (KLONOPIN) 0.5 MG tablet Take 1 tablet (0.5 mg total) by mouth 2 (two) times daily as needed for anxiety. 1 p.o. twice daily as needed 60 tablet 2  . gabapentin (NEURONTIN) 100 MG capsule TAKE 1 CAPSULE BY MOUTH THREE TIMES A DAY (Patient taking differently: Take 100 mg by mouth 3 (three) times daily. ) 270 capsule 0  . losartan-hydrochlorothiazide (HYZAAR) 100-25 MG tablet Take 1 tablet by mouth daily. 90 tablet 3  . meloxicam (MOBIC) 15 MG tablet Take 1 tablet (15 mg total) by mouth daily. 90 tablet 3  . mirtazapine (REMERON SOL-TAB) 15 MG disintegrating tablet TAKE 1 TABLET BY MOUTH EVERYDAY AT BEDTIME (Patient taking differently: Take 15 mg by mouth at bedtime. ) 90  tablet 1  . Multiple Vitamin (MULTIVITAMIN) tablet Take 1 tablet by mouth daily. Vitamin supplement.    . ondansetron (ZOFRAN) 4 MG tablet Take one tablet every 4-6 hours as needed for nausea 60 tablet 3  . pantoprazole (PROTONIX) 40 MG tablet Take 1 tablet (40 mg total) by mouth 2 (two) times daily. (Patient taking differently: Take 40 mg by mouth See admin instructions. Take 40 mg in the morning and may take a second 40 mg dose during the day as needed for heartburn) 60 tablet 3  . sertraline (ZOLOFT) 100 MG tablet TAKE 1 TABLET BY MOUTH EVERYDAY AT BEDTIME 90 tablet 1  . sucralfate (CARAFATE) 1 g tablet Take 1 tablet (1 g total) by mouth 4 (four) times daily. Dissolve tablet in 10 ml warm water (Patient taking differently: Take 1 g by mouth 2 (two) times daily. Dissolve tablet in 10 ml warm water) 56 tablet 1  . Tetrahydrozoline HCl (VISINE OP) Place 1 drop into both eyes daily as needed (dry eyes).     Current Facility-Administered Medications on File Prior to Visit  Medication Dose Route Frequency Provider Last Rate Last Dose  . ipratropium-albuterol (DUONEB) 0.5-2.5 (3) MG/3ML nebulizer solution 3 mL  3 mL Nebulization Once [place] Mallon, NP        BP (!) 150/82   Pulse 100   Temp 98 F (36.7 C)   Wt 152 lb (68.9 kg)   SpO2 98%   BMI 25.69 kg/m       Objective:   Physical Exam Vitals signs and nursing note reviewed.  Constitutional:      Appearance: Normal appearance.  Neck:     Musculoskeletal: Normal range of motion and neck supple.  Cardiovascular:     Rate and Rhythm: Normal rate and regular rhythm.     Pulses: Normal pulses.     Heart sounds: Normal heart sounds.  Pulmonary:     Effort: Pulmonary effort is normal.     Breath sounds: Normal breath sounds.  Abdominal:     General: Abdomen is flat.  Bowel sounds are normal.     Palpations: Abdomen is soft.  Musculoskeletal: Normal range of motion.        General: Tenderness present. No swelling, deformity or signs of  injury.     Comments: Tenderness with palpation along thoracic and lumbar back  Skin:    General: Skin is warm and dry.     Capillary Refill: Capillary refill takes less than 2 seconds.  Neurological:     General: No focal deficit present.     Mental Status: She is alert and oriented to person, place, and time. Mental status is at baseline.  Psychiatric:        Mood and Affect: Mood normal.        Behavior: Behavior normal.        Thought Content: Thought content normal.        Judgment: Judgment normal.       Assessment & Plan:  1. Shortness of breath - PNA has likely resolved.  This of breath likely from COPD.  We will give her Brio Ellipta inhaler sample.  She will follow-up with me in 2 weeks to let me know how she is doing with this.  Can also refer to different pulmonologist - DG Chest 2 View; Future  2. Primary osteoarthritis involving multiple joints - Ambulatory referral to Rheumatology  Shirline Frees, NP

## 2018-12-10 ENCOUNTER — Other Ambulatory Visit: Payer: Self-pay | Admitting: Family Medicine

## 2018-12-10 MED ORDER — GABAPENTIN 100 MG PO CAPS: ORAL_CAPSULE | ORAL | 1 refills | Status: DC

## 2018-12-11 ENCOUNTER — Ambulatory Visit: Payer: Medicare Other | Admitting: Internal Medicine

## 2018-12-11 ENCOUNTER — Encounter: Payer: Self-pay | Admitting: Internal Medicine

## 2018-12-11 VITALS — BP 140/70 | HR 80 | Ht 62.0 in | Wt 154.0 lb

## 2018-12-11 DIAGNOSIS — Z9889 Other specified postprocedural states: Secondary | ICD-10-CM | POA: Diagnosis not present

## 2018-12-11 DIAGNOSIS — K219 Gastro-esophageal reflux disease without esophagitis: Secondary | ICD-10-CM

## 2018-12-11 DIAGNOSIS — R1310 Dysphagia, unspecified: Secondary | ICD-10-CM | POA: Diagnosis not present

## 2018-12-11 DIAGNOSIS — K224 Dyskinesia of esophagus: Secondary | ICD-10-CM | POA: Diagnosis not present

## 2018-12-11 NOTE — Progress Notes (Signed)
HISTORY OF PRESENT ILLNESS:  Julie Rice is a 83 y.o. female with GERD, history of esophageal stricture, large symptomatic hiatal hernia status post fundoplication, post fundoplication dysphasia secondary to dysmotility (primary versus secondary with technically limited esophageal manometry).  She was last seen in this office July 21, 2018.  See that dictation for details.  She subsequently underwent upper endoscopy with Botox injection and balloon dilation of the esophagus to 20 mm on September 21, 2018.  She presents today for routine follow-up.  She is accompanied by her daughter-in-law.  Patient tells me that she has had significant improvement in her swallowing.  Very little difficulties.  She has gained a few pounds.  She has had some issues with pneumonia.  GI review of systems is otherwise negative.  She remains on pantoprazole with no classic reflux symptoms.  Still occasional regurgitation.  REVIEW OF SYSTEMS:  All non-GI ROS negative unless otherwise stated in the HPI except for urinary leakage, ankle swelling, fatigue, depression, cough, back pain, arthritis, anxiety  Past Medical History:  Diagnosis Date  . Anemia   . Anxiety   . Bipolar disorder (HCC)   . Blood transfusion 1991   autologous pts own blood given   . CAD (coronary artery disease)   . Cataract   . Chest pain    "@ rest, lying down, w/exertion"  . Chronic back pain    "mostly lower back but I do have upper back pain regularly" (04/06/2018)  . COPD (chronic obstructive pulmonary disease) (HCC)   . Depression   . Esophageal stricture   . Fecal occult blood test positive   . Fibromyalgia   . GERD (gastroesophageal reflux disease)   . Heart murmur    "slight" (04/06/2018)  . Hiatal hernia   . Hyperlipidemia    Patient denies  . Hypertension   . Internal hemorrhoids   . Lumbago   . Mitral regurgitation   . Osteoarthritis   . Osteoporosis   . Pneumonia ~ 01/2018  . PONV (postoperative nausea and  vomiting)    severe ponv, "in the past" (04/06/2018)  . Rectal bleeding   . Rheumatoid arthritis (HCC)   . Spondylosis   . TIA (transient ischemic attack) 2013  . Urinary incontinence    wears depends    Past Surgical History:  Procedure Laterality Date  . ABDOMINAL HYSTERECTOMY  1982  . BACK SURGERY    . BALLOON DILATION N/A 09/21/2018   Procedure: BALLOON DILATION;  Surgeon: Hilarie Fredrickson, MD;  Location: [facility];  Service: Endoscopy;  Laterality: N/A;  . BLADDER SUSPENSION  1980's  . BOTOX INJECTION N/A 09/21/2018   Procedure: BOTOX INJECTION;  Surgeon: Hilarie Fredrickson, MD;  Location: [facility];  Service: Endoscopy;  Laterality: N/A;  . CATARACT EXTRACTION W/ INTRAOCULAR LENS  IMPLANT, BILATERAL Bilateral 2010  . DILATION AND CURETTAGE OF UTERUS  1961  . ESOPHAGEAL MANOMETRY N/A 03/25/2018   Procedure: ESOPHAGEAL MANOMETRY (EM);  Surgeon: Napoleon Form, MD;  Location: [facility];  Service: Endoscopy;  Laterality: N/A;  . ESOPHAGOGASTRODUODENOSCOPY (EGD) WITH PROPOFOL N/A 04/07/2018   Procedure: ESOPHAGOGASTRODUODENOSCOPY (EGD) WITH PROPOFOL;  Surgeon: Sherrilyn Rist, MD;  Location: [facility];  Service: Gastroenterology;  Laterality: N/A;  . ESOPHAGOGASTRODUODENOSCOPY (EGD) WITH PROPOFOL N/A 09/21/2018   Procedure: ESOPHAGOGASTRODUODENOSCOPY (EGD) WITH PROPOFOL;  Surgeon: Hilarie Fredrickson, MD;  Location: [facility];  Service: Endoscopy;  Laterality: N/A;  . HERNIA REPAIR    . HIATAL HERNIA REPAIR N/A 08/02/2016   Procedure:  LAPAROSCOPIC REPAIR OF LARGE  HIATAL HERNIA;  Surgeon: Glenna Fellows, MD;  Location: [facility];  Service: General;  Laterality: N/A;  . JOINT REPLACEMENT    . LAPAROSCOPIC NISSEN FUNDOPLICATION N/A 08/02/2016   Procedure: LAPAROSCOPIC NISSEN FUNDOPLICATION;  Surgeon: Glenna Fellows, MD;  Location: [facility];  Service: General;  Laterality: N/A;  . LUMBAR LAMINECTOMY  1990; 1994; [id number];4496   "I've got 2 stainless steel rods; 6 screws; 2 ray  cages"took bone from right hip to put in back  . TONSILLECTOMY AND ADENOIDECTOMY  1945  . TOTAL KNEE ARTHROPLASTY Right ~ 1996  . TUBAL LIGATION  ~ 1976    Social History Julie Rice  reports that she has never smoked. She has never used smokeless tobacco. She reports that she does not drink alcohol or use drugs.  family history includes Heart disease (age of onset: 17) in her father; Hypertension in her mother; Kidney disease in her mother; Suicidality in her son.  Allergies  Allergen Reactions  . Morphine Other (See Comments)    Flushing, rash, itching  . Tape Other (See Comments)    Band aides, adhesive tape Redness and pulls skin off       PHYSICAL EXAMINATION: Vital signs: BP 140/70 (BP Location: Left Arm, Patient Position: Sitting, Cuff Size: Normal)   Pulse 80   Ht 5\' 2"  (1.575 m)   Wt 154 lb (69.9 kg)   BMI 28.17 kg/m   Constitutional: generally well-appearing, no acute distress Psychiatric: alert and oriented x3, cooperative Eyes: extraocular movements intact, anicteric, conjunctiva pink Mouth: oral pharynx moist, no lesions Neck: supple no lymphadenopathy Cardiovascular: heart regular rate and rhythm, no murmur Lungs: clear to auscultation bilaterally Abdomen: soft, nontender, nondistended, no obvious ascites, no peritoneal signs, normal bowel sounds, no organomegaly Rectal: Omitted Extremities: no rubbing, cyanosis, or lower extremity edema bilaterally Skin: no lesions on visible extremities Neuro: No focal deficits.  Cranial nerves intact  ASSESSMENT:  1.  History of GERD with esophageal stricture and large symptomatic hiatal hernia status post fundoplication 2.  Post fundoplication dysphasia with motility disorder as described.  Symptoms significantly improved status post Botox injection and balloon dilation of the esophagus 3.  Stable weight with slight gain  PLAN:  1.  Continue pantoprazole 2.  Routine GI follow-up 6 months.  I discussed the  durability of Botox injection therapy.  Certainly could be repeated if necessary.  She will contact me in the interim if she has any questions or problems.  15-minute spent face-to-face with the patient.  Greater than 50% of the time used for counseling regarding her esophageal disorder

## 2018-12-11 NOTE — Patient Instructions (Signed)
Please follow up in 6 months.

## 2018-12-14 ENCOUNTER — Encounter: Payer: Self-pay | Admitting: Adult Health

## 2018-12-16 ENCOUNTER — Other Ambulatory Visit: Payer: Self-pay | Admitting: Adult Health

## 2018-12-16 MED ORDER — PREDNISONE 10 MG PO TABS: ORAL_TABLET | ORAL | 0 refills | Status: DC

## 2018-12-22 ENCOUNTER — Other Ambulatory Visit: Payer: Self-pay | Admitting: Adult Health

## 2018-12-22 DIAGNOSIS — R05 Cough: Secondary | ICD-10-CM

## 2018-12-22 DIAGNOSIS — R059 Cough, unspecified: Secondary | ICD-10-CM

## 2018-12-25 NOTE — Progress Notes (Deleted)
Office Visit Note  Patient: Julie ChanceDelores C Rice             Date of Birth: 05/16/1936           MRN: [id number]             PCP: [name], [name], NP Referring: [name], [name], NP Visit Date: 01/08/2019 Occupation: @GUAROCC @  Subjective:  No chief complaint on file.   History of Present Illness: Julie Rice is a 83 y.o. female ***   Activities of Daily Living:  Patient reports morning stiffness for *** {minute/hour:19697}.   Patient {ACTIONS;DENIES/REPORTS:21021675::"Denies"} nocturnal pain.  Difficulty dressing/grooming: {ACTIONS;DENIES/REPORTS:21021675::"Denies"} Difficulty climbing stairs: {ACTIONS;DENIES/REPORTS:21021675::"Denies"} Difficulty getting out of chair: {ACTIONS;DENIES/REPORTS:21021675::"Denies"} Difficulty using hands for taps, buttons, cutlery, and/or writing: {ACTIONS;DENIES/REPORTS:21021675::"Denies"}  No Rheumatology ROS completed.   PMFS History:  Patient Active Problem List   Diagnosis Date Noted  . Esophageal motility disorder   . Esophageal dysmotility   . Loss of weight   . Moderate protein-calorie malnutrition (HCC)   . Hematemesis 04/06/2018  . Periesophageal hiatal hernia 08/02/2016  . Dysphagia 05/10/2016  . Esophageal stricture 05/10/2016  . Epigastric pain 05/10/2016  . Generalized anxiety disorder 02/06/2012  . Major depressive disorder, recurrent (HCC) 02/05/2012  . Hypokalemia 11/09/2011  . Nausea and vomiting in adult 11/08/2011  . Dehydration 11/08/2011  . Tremors of nervous system 11/08/2011  . Hypothyroidism 05/10/2008  . Dyslipidemia 05/10/2008  . UNSPECIFIED ANEMIA 05/10/2008  . CAROTID ARTERY DISEASE 05/10/2008  . Transient cerebral ischemia 05/10/2008  . Chronic back pain 05/10/2008  . OSTEOPENIA 05/10/2008  . ESOPHAGEAL STRICTURE 01/19/2008  . Essential hypertension 04/29/2007  . GERD 04/29/2007  . Osteoarthritis 04/29/2007  . SPONDYLOSIS 04/29/2007    Past Medical History:  Diagnosis Date  . Anemia   . Anxiety     . Bipolar disorder (HCC)   . Blood transfusion 1991   autologous pts own blood given   . CAD (coronary artery disease)   . Cataract   . Chest pain    "@ rest, lying down, w/exertion"  . Chronic back pain    "mostly lower back but I do have upper back pain regularly" (04/06/2018)  . COPD (chronic obstructive pulmonary disease) (HCC)   . Depression   . Esophageal stricture   . Fecal occult blood test positive   . Fibromyalgia   . GERD (gastroesophageal reflux disease)   . Heart murmur    "slight" (04/06/2018)  . Hiatal hernia   . Hyperlipidemia    Patient denies  . Hypertension   . Internal hemorrhoids   . Lumbago   . Mitral regurgitation   . Osteoarthritis   . Osteoporosis   . Pneumonia ~ 01/2018  . PONV (postoperative nausea and vomiting)    severe ponv, "in the past" (04/06/2018)  . Rectal bleeding   . Rheumatoid arthritis (HCC)   . Spondylosis   . TIA (transient ischemic attack) 2013  . Urinary incontinence    wears depends    Family History  Problem Relation Age of Onset  . Kidney disease Mother   . Hypertension Mother   . Heart disease Father [id number]       MI  . Suicidality Son   . Colon cancer Neg Hx   . Esophageal cancer Neg Hx   . Pancreatic cancer Neg Hx   . Rectal cancer Neg Hx   . Stomach cancer Neg Hx    Past Surgical History:  Procedure Laterality Date  . ABDOMINAL HYSTERECTOMY  1982  .  BACK SURGERY    . BALLOON DILATION N/A 09/21/2018   Procedure: BALLOON DILATION;  Surgeon: Hilarie Fredrickson, MD;  Location: [facility];  Service: Endoscopy;  Laterality: N/A;  . BLADDER SUSPENSION  1980's  . BOTOX INJECTION N/A 09/21/2018   Procedure: BOTOX INJECTION;  Surgeon: Hilarie Fredrickson, MD;  Location: [facility];  Service: Endoscopy;  Laterality: N/A;  . CATARACT EXTRACTION W/ INTRAOCULAR LENS  IMPLANT, BILATERAL Bilateral 2010  . DILATION AND CURETTAGE OF UTERUS  1961  . ESOPHAGEAL MANOMETRY N/A 03/25/2018   Procedure: ESOPHAGEAL MANOMETRY (EM);  Surgeon: Napoleon Form, MD;  Location: [facility];  Service: Endoscopy;  Laterality: N/A;  . ESOPHAGOGASTRODUODENOSCOPY (EGD) WITH PROPOFOL N/A 04/07/2018   Procedure: ESOPHAGOGASTRODUODENOSCOPY (EGD) WITH PROPOFOL;  Surgeon: Sherrilyn Rist, MD;  Location: [facility];  Service: Gastroenterology;  Laterality: N/A;  . ESOPHAGOGASTRODUODENOSCOPY (EGD) WITH PROPOFOL N/A 09/21/2018   Procedure: ESOPHAGOGASTRODUODENOSCOPY (EGD) WITH PROPOFOL;  Surgeon: Hilarie Fredrickson, MD;  Location: [facility];  Service: Endoscopy;  Laterality: N/A;  . HERNIA REPAIR    . HIATAL HERNIA REPAIR N/A 08/02/2016   Procedure: LAPAROSCOPIC REPAIR OF LARGE  HIATAL HERNIA;  Surgeon: Glenna Fellows, MD;  Location: [facility];  Service: General;  Laterality: N/A;  . JOINT REPLACEMENT    . LAPAROSCOPIC NISSEN FUNDOPLICATION N/A 08/02/2016   Procedure: LAPAROSCOPIC NISSEN FUNDOPLICATION;  Surgeon: Glenna Fellows, MD;  Location: [facility];  Service: General;  Laterality: N/A;  . LUMBAR LAMINECTOMY  1990; 1994; [id number];5027   "I've got 2 stainless steel rods; 6 screws; 2 ray cages"took bone from right hip to put in back  . TONSILLECTOMY AND ADENOIDECTOMY  1945  . TOTAL KNEE ARTHROPLASTY Right ~ 1996  . TUBAL LIGATION  ~ 77   Social History   Social History Narrative   Retired    Camera operator History  Administered Date(s) Administered  . Influenza Whole 09/04/2007, 08/16/2008  . Influenza,inj,Quad PF,6+ Mos 08/17/2013, 09/02/2014  . Pneumococcal Conjugate-13 09/02/2014  . Pneumococcal Polysaccharide-23 06/01/2013  . Td 04/08/2006  . Zoster 04/08/2006     Objective: Vital Signs: There were no vitals taken for this visit.   Physical Exam   Musculoskeletal Exam: ***  CDAI Exam: CDAI Score: Not documented Patient Global Assessment: Not documented; Provider Global Assessment: Not documented Swollen: Not documented; Tender: Not documented Joint Exam   Not documented   There is currently no information documented  on the homunculus. Go to the Rheumatology activity and complete the homunculus joint exam.  Investigation: No additional findings.  Imaging: Dg Chest 2 View  Result Date: 12/02/2018 CLINICAL DATA:  83 year old female with a history of pneumonia EXAM: CHEST - 2 VIEW COMPARISON:  11/05/2018, 10/20/2018, abdomen CT 04/05/2018, plain film 04/05/2018 FINDINGS: Cardiomediastinal silhouette unchanged in size and contour. Tortuosity of the thoracic aorta. Double density of the lower mediastinum persists. Stigmata of emphysema, with increased retrosternal airspace, flattened hemidiaphragms, increased AP diameter, and hyperinflation on the AP view. Diffusely coarsened interstitial markings with no pneumothorax. Pleuroparenchymal thickening is unchanged. No evidence of residual airspace disease at the lung bases with no pleural effusion or new confluent airspace disease. Lateral view demonstrates double density overlying the posterior diaphragms, which is accentuated by the significant osteophyte production of the lower thoracic degenerative changes. Reticular opacities of the lung bases persist, similar prior CT and plain film. Multilevel degenerative changes of the spine. IMPRESSION: No residual airspace opacity at the lung bases, with a background of emphysema and chronic changes, as above.  Electronically Signed   By: Gilmer Mor D.O.   On: 12/02/2018 08:50    Recent Labs: Lab Results  Component Value Date   WBC 8.5 04/13/2018   HGB 11.3 (L) 04/13/2018   PLT 378.0 04/13/2018   NA 141 04/13/2018   K 4.4 04/13/2018   CL 101 04/13/2018   CO2 31 04/13/2018   GLUCOSE 92 04/13/2018   BUN 17 04/13/2018   CREATININE 0.83 04/13/2018   BILITOT 0.5 04/05/2018   ALKPHOS 70 04/05/2018   AST 18 04/05/2018   ALT 12 (L) 04/05/2018   PROT 6.7 04/05/2018   ALBUMIN 3.4 (L) 04/05/2018   CALCIUM 9.4 04/13/2018   GFRAA >60 04/09/2018    Speciality Comments: No specialty comments available.  Procedures:  No  procedures performed Allergies: Morphine and Tape   Assessment / Plan:     Visit Diagnoses: Primary osteoarthritis involving multiple joints  Fibromyalgia  History of TIA (transient ischemic attack)  History of coronary artery disease  Essential hypertension  History of COPD  Esophageal dysmotility  Esophageal stricture  History of gastroesophageal reflux (GERD)  History of hypothyroidism  History of bipolar disorder  Anxiety and depression  History of hyperlipidemia   Orders: No orders of the defined types were placed in this encounter.  No orders of the defined types were placed in this encounter.   Face-to-face time spent with patient was *** minutes. Greater than 50% of time was spent in counseling and coordination of care.  Follow-Up Instructions: No follow-ups on file.   Gearldine Bienenstock, PA-C  Note - This record has been created using Dragon software.  Chart creation errors have been sought, but may not always  have been located. Such creation errors do not reflect on  the standard of medical care.

## 2018-12-31 ENCOUNTER — Telehealth: Payer: Self-pay | Admitting: Internal Medicine

## 2018-12-31 NOTE — Telephone Encounter (Signed)
Spoke with the pt and scheduled appt with BQ

## 2018-12-31 NOTE — Telephone Encounter (Signed)
Patient called stating that Dr Evelene Croon requested she switch providers from Dr Sherene Sires to Dr Kendrick Fries.  Message routed to Dr Sherene Sires and Dr Kendrick Fries

## 2018-12-31 NOTE — Telephone Encounter (Signed)
OK, 30 min only 

## 2018-12-31 NOTE — Telephone Encounter (Signed)
Fine with me

## 2019-01-08 ENCOUNTER — Ambulatory Visit: Payer: Self-pay | Admitting: Rheumatology

## 2019-01-11 ENCOUNTER — Ambulatory Visit: Payer: Medicare Other

## 2019-01-11 ENCOUNTER — Ambulatory Visit (INDEPENDENT_AMBULATORY_CARE_PROVIDER_SITE_OTHER): Payer: Medicare Other

## 2019-01-11 ENCOUNTER — Ambulatory Visit: Payer: Medicare Other | Admitting: Internal Medicine

## 2019-01-11 ENCOUNTER — Ambulatory Visit: Payer: Self-pay | Admitting: Adult Health

## 2019-01-11 ENCOUNTER — Encounter: Payer: Self-pay | Admitting: Internal Medicine

## 2019-01-11 VITALS — BP 124/62 | HR 86 | Temp 98.5°F | Wt 152.0 lb

## 2019-01-11 DIAGNOSIS — M40209 Unspecified kyphosis, site unspecified: Secondary | ICD-10-CM

## 2019-01-11 DIAGNOSIS — R053 Chronic cough: Secondary | ICD-10-CM

## 2019-01-11 DIAGNOSIS — D649 Anemia, unspecified: Secondary | ICD-10-CM

## 2019-01-11 DIAGNOSIS — R05 Cough: Secondary | ICD-10-CM | POA: Diagnosis not present

## 2019-01-11 DIAGNOSIS — M810 Age-related osteoporosis without current pathological fracture: Secondary | ICD-10-CM | POA: Diagnosis not present

## 2019-01-11 DIAGNOSIS — Z8701 Personal history of pneumonia (recurrent): Secondary | ICD-10-CM | POA: Diagnosis not present

## 2019-01-11 DIAGNOSIS — R0602 Shortness of breath: Secondary | ICD-10-CM

## 2019-01-11 LAB — POCT HEMOGLOBIN: Hemoglobin: 9.1 g/dL — AB (ref 11–14.6)

## 2019-01-11 NOTE — Patient Instructions (Addendum)
yor chest x ray shows no acute findings    However seems that you are more anemic than usual.  Advise blood work to confirm and fu with [employer].  This may be part of  Your shortness of breath.   Orders placed today

## 2019-01-11 NOTE — Telephone Encounter (Signed)
Pt called in c/o being short of breath and a constant dry, raspy cough.   She coughed constantly while I was triaging her.  She has a pulmonary consult coming up 02/23/2019 but does not feel she can wait that long.  See triage notes.    I made her an appt with Dr. Fabian Sharp for today at 3:30.    I sent these triage notes to the [facility] practice.      Reason for Disposition . [1] Continuous (nonstop) coughing AND [2] keeps from working or sleeping    Also short of breath.   History of pneumonia  Answer Assessment - Initial Assessment Questions 1. RESPIRATORY STATUS: "Describe your breathing?" (e.g., wheezing, shortness of breath, unable to speak, severe coughing)      I saw [name] not long ago.   He wants me to see a pulmonary doctor.   They can't see me until April 21st.     I have a night cough that keeps me up at night.   Yesterday morning I was so short of breath I could hardly dress and I went to the mailbox and was so short of breath.    It's a dry constant cough.    2. ONSET: "When did this breathing problem begin?"      It's been a year off and on.    3. PATTERN "Does the difficult breathing come and go, or has it been constant since it started?"      Constant.  It keeps me awake at night.    No cough syrup being used. 4. SEVERITY: "How bad is your breathing?" (e.g., mild, moderate, severe)    - MILD: No SOB at rest, mild SOB with walking, speaks normally in sentences, can lay down, no retractions, pulse < 100.    - MODERATE: SOB at rest, SOB with minimal exertion and prefers to sit, cannot lie down flat, speaks in phrases, mild retractions, audible wheezing, pulse 100-120.    - SEVERE: Very SOB at rest, speaks in single words, struggling to breathe, sitting hunched forward, retractions, pulse > 120      Moderate.   Hard to cough due to dry, raspy cough.   Short of breath with exertion.   5. RECURRENT SYMPTOM: "Have you had difficulty breathing before?" If so, ask: "When was the  last time?" and "What happened that time?"      I've had pneumonia and ended up in the hospital in July. 6. CARDIAC HISTORY: "Do you have any history of heart disease?" (e.g., heart attack, angina, bypass surgery, angioplasty)      No 7. LUNG HISTORY: "Do you have any history of lung disease?"  (e.g., pulmonary embolus, asthma, emphysema)     Dr. Sherene Sires, pulmonary doctor thought I may have COPD and possible asthma.   No blood clots in lungs. 8. CAUSE: "What do you think is causing the breathing problem?"      I don't feel like I have pneumonia but maybe I do.   9. OTHER SYMPTOMS: "Do you have any other symptoms? (e.g., dizziness, runny nose, cough, chest pain, fever)     Inside of my jaws and gums are sore.   No change in medications. No earache, no sore throat or runny nose.   No fever.   No travel of any kind. 10. PREGNANCY: "Is there any chance you are pregnant?" "When was your last menstrual period?"       Not asked due to age. 11. TRAVEL: "Have you  traveled out of the country in the last month?" (e.g., travel history, exposures)       No travel anywhere.  Protocols used: BREATHING DIFFICULTY-A-AH

## 2019-01-11 NOTE — Telephone Encounter (Signed)
Noted  

## 2019-01-11 NOTE — Progress Notes (Signed)
Chief Complaint  Patient presents with  . Cough    started nov, dry cough,     HPI: Julie Rice 83 y.o. come in for S[facility] PCP NA   sent in by nurse triage   Has ongoing  resp sx and  Future pulmonary consult    And rheum for osteoporosis   pending but felt worse today  And PCP na until tomorrow  Here with sis  Having problem with recurrent dry coughing shortness of breath is getting worse. Denies any falling hemoptysis bleeding very restrictive diet. She was supposed to see rheumatology but had to cancel this was for her osteoporosis and she has significant kyphosis that she is aware may be contributing to her shortness of breath.  rx  pna in December   and saw cory N for cough prpbaby copd and fiven breo inhaler     Pending pulm consults  Jan 28   Below is a nurse triage note. Pt called in c/o being short of breath and a constant dry, raspy cough.   She coughed constantly while I was triaging her. She has a pulmonary consult coming up 02/23/2019 but does not feel she can wait that long. See triage notes.   I made her an appt with Dr. Fabian Sharp for today at 3:30.    I sent these triage notes to the [facility] practice.  Hx pna 12 19 rx levaquin   ROS: See pertinent positives and negatives per HPI.  No increased swelling in feet has baseline.  Past Medical History:  Diagnosis Date  . Anemia   . Anxiety   . Bipolar disorder (HCC)   . Blood transfusion 1991   autologous pts own blood given   . CAD (coronary artery disease)   . Cataract   . Chest pain    "@ rest, lying down, w/exertion"  . Chronic back pain    "mostly lower back but I do have upper back pain regularly" (04/06/2018)  . COPD (chronic obstructive pulmonary disease) (HCC)   . Depression   . Esophageal stricture   . Fecal occult blood test positive   . Fibromyalgia   . GERD (gastroesophageal reflux disease)   . Heart murmur    "slight" (04/06/2018)  . Hiatal hernia   . Hyperlipidemia    Patient denies  .  Hypertension   . Internal hemorrhoids   . Lumbago   . Mitral regurgitation   . Osteoarthritis   . Osteoporosis   . Pneumonia ~ 01/2018  . PONV (postoperative nausea and vomiting)    severe ponv, "in the past" (04/06/2018)  . Rectal bleeding   . Rheumatoid arthritis (HCC)   . Spondylosis   . TIA (transient ischemic attack) 2013  . Urinary incontinence    wears depends    Family History  Problem Relation Age of Onset  . Kidney disease Mother   . Hypertension Mother   . Heart disease Father 54       MI  . Suicidality Son   . Colon cancer Neg Hx   . Esophageal cancer Neg Hx   . Pancreatic cancer Neg Hx   . Rectal cancer Neg Hx   . Stomach cancer Neg Hx     Social History   Socioeconomic History  . Marital status: Widowed    Spouse name: Not on file  . Number of children: 2  . Years of education: Not on file  . Highest education level: Not on file  Occupational History  Employer: RETIRED  Social Needs  . Financial resource strain: Not on file  . Food insecurity:    Worry: Not on file    Inability: Not on file  . Transportation needs:    Medical: Not on file    Non-medical: Not on file  Tobacco Use  . Smoking status: Never Smoker  . Smokeless tobacco: Never Used  Substance and Sexual Activity  . Alcohol use: Never    Frequency: Never  . Drug use: Never  . Sexual activity: Not Currently  Lifestyle  . Physical activity:    Days per week: Not on file    Minutes per session: Not on file  . Stress: Not on file  Relationships  . Social connections:    Talks on phone: Not on file    Gets together: Not on file    Attends religious service: Not on file    Active member of club or organization: Not on file    Attends meetings of clubs or organizations: Not on file    Relationship status: Not on file  Other Topics Concern  . Not on file  Social History Narrative   Retired    Widowed    Outpatient Medications Prior to Visit  Medication Sig Dispense Refill    . acetaminophen (TYLENOL) 500 MG tablet Take 1,000 mg by mouth 2 (two) times daily as needed for moderate pain.    Marland Kitchen. amLODipine (NORVASC) 2.5 MG tablet Take 1 tablet (2.5 mg total) by mouth daily. 90 tablet 3  . aspirin 81 MG tablet Take 1 tablet (81 mg total) by mouth daily. For cardiovascular health.    . gabapentin (NEURONTIN) 100 MG capsule TAKE 1 CAPSULE BY MOUTH THREE TIMES A DAY 270 capsule 1  . losartan-hydrochlorothiazide (HYZAAR) 100-25 MG tablet Take 1 tablet by mouth daily. 90 tablet 3  . meloxicam (MOBIC) 15 MG tablet Take 1 tablet (15 mg total) by mouth daily. 90 tablet 3  . mirtazapine (REMERON SOL-TAB) 15 MG disintegrating tablet TAKE 1 TABLET BY MOUTH EVERYDAY AT BEDTIME (Patient taking differently: Take 15 mg by mouth at bedtime. ) 90 tablet 1  . Multiple Vitamin (MULTIVITAMIN) tablet Take 1 tablet by mouth daily. Vitamin supplement.    . ondansetron (ZOFRAN) 4 MG tablet Take one tablet every 4-6 hours as needed for nausea 60 tablet 3  . pantoprazole (PROTONIX) 40 MG tablet Take 1 tablet (40 mg total) by mouth 2 (two) times daily. (Patient taking differently: Take 40 mg by mouth See admin instructions. Take 40 mg in the morning and may take a second 40 mg dose during the day as needed for heartburn) 60 tablet 3  . sertraline (ZOLOFT) 100 MG tablet TAKE 1 TABLET BY MOUTH EVERYDAY AT BEDTIME 90 tablet 1  . sucralfate (CARAFATE) 1 g tablet Take 1 tablet (1 g total) by mouth 4 (four) times daily. Dissolve tablet in 10 ml warm water (Patient taking differently: Take 1 g by mouth 2 (two) times daily. Dissolve tablet in 10 ml warm water) 56 tablet 1  . Tetrahydrozoline HCl (VISINE OP) Place 1 drop into both eyes daily as needed (dry eyes).    . predniSONE (DELTASONE) 10 MG tablet 40 mg x 3 days, 20 mg x 3 days, 10 mg x 3 days 21 tablet 0  . clonazePAM (KLONOPIN) 0.5 MG tablet Take 1 tablet (0.5 mg total) by mouth 2 (two) times daily as needed for anxiety. 1 p.o. twice daily as needed 60  tablet 2  No facility-administered medications prior to visit.      EXAM:  BP 124/62 (BP Location: Left Arm, Patient Position: Sitting, Cuff Size: Normal)   Pulse 86   Temp 98.5 F (36.9 C) (Oral)   Wt 152 lb (68.9 kg)   SpO2 92%   BMI 27.80 kg/m   Body mass index is 27.8 kg/m. WDWN in NAD  quiet respirations; well-groomed with obvious kyphosis.  No respiratory distress at rest.  Voice is clear. Non toxic . HEENT: Normocephalic ;atraumatic , Eyes;  PERRL, EOMs  Full, lids and conjunctiva clear,,Ears: no deformities, canals nl, TM landmarks normal, Nose: no deformity or discharge but congested;face minimally tender Mouth : OP clear without lesion or edema .  There is a red patch on the left buccal mucosa but no white patches.  Neck: Supple without adenopathy or masses or bruits Chest:  Clear to A without wheezes rales or rhonchi breath sounds are equal. CV:  S1-S2 no gallops or murmurs peripheral perfusion is normal she has +1 pedal edema symmetrical. Skin :nl perfusion and no acute rashes no petechiae or obvious bruising. Mentation appears to be normal and neurologic appears nonfocal. PSYCH: pleasant and cooperative, no obvious depression or anxiety Lab Results  Component Value Date   WBC 8.5 04/13/2018   HGB 9.1 (A) 01/11/2019   HCT 34.3 (L) 04/13/2018   PLT 378.0 04/13/2018   GLUCOSE 92 04/13/2018   CHOL 234 (H) 04/25/2017   TRIG 191.0 (H) 04/25/2017   HDL 65.00 04/25/2017   LDLDIRECT 122.2 06/01/2013   LDLCALC 131 (H) 04/25/2017   ALT 12 (L) 04/05/2018   AST 18 04/05/2018   NA 141 04/13/2018   K 4.4 04/13/2018   CL 101 04/13/2018   CREATININE 0.83 04/13/2018   BUN 17 04/13/2018   CO2 31 04/13/2018   TSH 2.02 04/25/2017   INR 0.9 02/06/2009   BP Readings from Last 3 Encounters:  01/11/19 124/62  12/11/18 140/70  12/01/18 (!) 150/82  I chest x-ray today shows no acute findings no pneumonia or airspace disease. Hemoglobin is 9.1 by HemoCue. ASSESSMENT AND  PLAN:  Discussed the following assessment and plan:  Cough, persistent - Plan: DG Chest 2 View, DG Chest 2 View, Basic metabolic panel, CBC with Differential/Platelet, Hepatic function panel, TSH, T4, free, IBC + Ferritin, Brain natriuretic peptide, VITAMIN D 25 Hydroxy (Vit-D Deficiency, Fractures), Vitamin B12  History of pneumonia - Plan: DG Chest 2 View, DG Chest 2 View, Basic metabolic panel, CBC with Differential/Platelet, Hepatic function panel, TSH, T4, free, IBC + Ferritin, Brain natriuretic peptide, VITAMIN D 25 Hydroxy (Vit-D Deficiency, Fractures), Vitamin B12  Shortness of breath - Plan: DG Chest 2 View, POC Hemoglobin, DG Chest 2 View, Basic metabolic panel, CBC with Differential/Platelet, Hepatic function panel, TSH, T4, free, IBC + Ferritin, Brain natriuretic peptide, VITAMIN D 25 Hydroxy (Vit-D Deficiency, Fractures), Vitamin B12  Anemia, unspecified type - Plan: Basic metabolic panel, CBC with Differential/Platelet, Hepatic function panel, TSH, T4, free, IBC + Ferritin, Brain natriuretic peptide, VITAMIN D 25 Hydroxy (Vit-D Deficiency, Fractures), Vitamin B12  Osteoporosis without current pathological fracture, unspecified osteoporosis type - Plan: Basic metabolic panel, CBC with Differential/Platelet, Hepatic function panel, TSH, T4, free, IBC + Ferritin, Brain natriuretic peptide, VITAMIN D 25 Hydroxy (Vit-D Deficiency, Fractures), Vitamin B12  Kyphosis, unspecified kyphosis type, unspecified spinal region There may be many contributors to her shortness of breath.  She has some kyphosis with probably decreased capacity of aeration if her hemoglobin is accurate the mild anemia  may be contributing.  But no obvious cause. In addition probably underlying lung disease. No acute findings on exam today needs further evaluation. Because of the late hour we put in future orders for blood work that she will get and then follow-up with Denyse Amass with results.  And the decision of whether to  add iron or other medications. Should keep her appointment with pulmonary and reschedule for with rheumatology. If worsenings in the meantime get back with [place] or PCP. -Patient advised to return or notify health care team  if  new concerns arise.  Patient Instructions  yor chest x ray shows no acute findings    However seems that you are more anemic than usual.  Advise blood work to confirm and fu with [employer].  This may be part of  Your shortness of breath.   Orders placed today    Christena Sunderlin K. Kassady Laboy M.D.

## 2019-01-12 LAB — BASIC METABOLIC PANEL
BUN: 20 mg/dL (ref 6–23)
CO2: 31 mEq/L (ref 19–32)
Calcium: 9 mg/dL (ref 8.4–10.5)
Chloride: 100 mEq/L (ref 96–112)
Creatinine, Ser: 0.9 mg/dL (ref 0.40–1.20)
GFR: 60.09 mL/min (ref >60.00)
Glucose, Bld: 89 mg/dL (ref 70–99)
Potassium: 3.8 mEq/L (ref 3.5–5.1)
Sodium: 140 mEq/L (ref 135–145)

## 2019-01-12 LAB — CBC WITH DIFFERENTIAL/PLATELET
Basophils Absolute: 0.2 10*3/uL — ABNORMAL HIGH (ref 0.0–0.1)
Basophils Relative: 2.1 % (ref 0.0–3.0)
Eosinophils Absolute: 0.7 10*3/uL (ref 0.0–0.7)
Eosinophils Relative: 9.4 % — ABNORMAL HIGH (ref 0.0–5.0)
HCT: 30.4 % — ABNORMAL LOW (ref 36.0–46.0)
Hemoglobin: 9.7 g/dL — ABNORMAL LOW (ref 12.0–15.0)
Lymphocytes Relative: 23.2 % (ref 12.0–46.0)
Lymphs Abs: 1.7 10*3/uL (ref 0.7–4.0)
MCHC: 32 g/dL (ref 30.0–36.0)
MCV: 80.2 fl (ref 78.0–100.0)
Monocytes Absolute: 0.5 10*3/uL (ref 0.1–1.0)
Monocytes Relative: 6.9 % (ref 3.0–12.0)
Neutro Abs: 4.3 10*3/uL (ref 1.4–7.7)
Neutrophils Relative %: 58.4 % (ref 43.0–77.0)
Platelets: 376 10*3/uL (ref 150.0–400.0)
RBC: 3.79 Mil/uL — ABNORMAL LOW (ref 3.87–5.11)
RDW: 15.3 % (ref 11.5–15.5)
WBC: 7.4 10*3/uL (ref 4.0–10.5)

## 2019-01-12 LAB — IBC + FERRITIN
Ferritin: 24.2 ng/mL (ref 10.0–291.0)
Iron: 23 ug/dL — ABNORMAL LOW (ref 42–145)
Saturation Ratios: 5 % — ABNORMAL LOW (ref 20.0–50.0)
Transferrin: 330 mg/dL (ref 212.0–360.0)

## 2019-01-12 LAB — HEPATIC FUNCTION PANEL
ALT: 7 U/L (ref 0–35)
AST: 11 U/L (ref 0–37)
Albumin: 3.9 g/dL (ref 3.5–5.2)
Alkaline Phosphatase: 60 U/L (ref 39–117)
Bilirubin, Direct: 0 mg/dL (ref 0.0–0.3)
Total Bilirubin: 0.3 mg/dL (ref 0.2–1.2)
Total Protein: 6.3 g/dL (ref 6.0–8.3)

## 2019-01-12 LAB — VITAMIN B12: Vitamin B-12: 523 pg/mL (ref 211–911)

## 2019-01-12 LAB — TSH: TSH: 1.58 u[IU]/mL (ref 0.35–4.50)

## 2019-01-12 LAB — VITAMIN D 25 HYDROXY (VIT D DEFICIENCY, FRACTURES): VITD: 15.82 ng/mL — AB (ref 30.00–100.00)

## 2019-01-12 LAB — BRAIN NATRIURETIC PEPTIDE: Pro B Natriuretic peptide (BNP): 196 pg/mL — ABNORMAL HIGH (ref 0.0–100.0)

## 2019-01-12 LAB — T4, FREE: Free T4: 0.86 ng/dL (ref 0.60–1.60)

## 2019-01-12 NOTE — Addendum Note (Signed)
Addended by: Bonnye Fava on: 01/12/2019 02:27 PM   Modules accepted: Orders

## 2019-01-13 ENCOUNTER — Encounter: Payer: Self-pay | Admitting: Adult Health

## 2019-01-13 ENCOUNTER — Other Ambulatory Visit: Payer: Self-pay | Admitting: Adult Health

## 2019-01-13 MED ORDER — MONTELUKAST SODIUM 10 MG PO TABS: 10.0000 mg | ORAL_TABLET | Freq: Every day | ORAL | 1 refills | Status: DC

## 2019-01-21 ENCOUNTER — Encounter: Payer: Self-pay | Admitting: Adult Health

## 2019-01-26 ENCOUNTER — Encounter: Payer: Self-pay | Admitting: Adult Health

## 2019-01-26 ENCOUNTER — Telehealth: Payer: Self-pay | Admitting: Adult Health

## 2019-01-26 NOTE — Telephone Encounter (Signed)
Spoke to patient on the phone. She reports that over the weekend she has been experiencing a fever up to 101? Has been short of breath and had " severe burning" sensation across her chest. Yesterday she checked her pulse ox and reports a reading of 88 % and a heart rate as high as 196. She did breath exercises and her pulse eventually dropped to 95 and oxygen saturation came up to 93%   She continues to feel short of breath and runs a low grade fever.   On the phone she sounds very short of breath with just talking. She denies CP or palpitations as this time.   Due to symptoms I advised patient to go to the ER and she agreed

## 2019-02-01 ENCOUNTER — Ambulatory Visit: Payer: Self-pay | Admitting: Rheumatology

## 2019-02-03 ENCOUNTER — Ambulatory Visit: Payer: Self-pay | Admitting: Rheumatology

## 2019-02-03 ENCOUNTER — Encounter: Payer: Self-pay | Admitting: Adult Health

## 2019-02-04 ENCOUNTER — Other Ambulatory Visit: Payer: Self-pay

## 2019-02-04 ENCOUNTER — Ambulatory Visit
Admission: RE | Admit: 2019-02-04 | Discharge: 2019-02-04 | Disposition: A | Discharge status: Home / Self Care | Payer: Medicare Other | Source: Ambulatory Visit | Attending: Adult Health | Admitting: Adult Health

## 2019-02-04 ENCOUNTER — Encounter: Payer: Self-pay | Admitting: Adult Health

## 2019-02-04 ENCOUNTER — Emergency Department (HOSPITAL_COMMUNITY)
Admission: EM | Admit: 2019-02-04 | Discharge: 2019-02-04 | Disposition: A | Discharge status: Home / Self Care | Payer: Medicare Other | Attending: Emergency Medicine | Admitting: Emergency Medicine

## 2019-02-04 ENCOUNTER — Ambulatory Visit (INDEPENDENT_AMBULATORY_CARE_PROVIDER_SITE_OTHER): Payer: Medicare Other | Admitting: Adult Health

## 2019-02-04 ENCOUNTER — Telehealth: Payer: Self-pay | Admitting: Adult Health

## 2019-02-04 ENCOUNTER — Encounter (HOSPITAL_COMMUNITY): Payer: Self-pay | Admitting: Emergency Medicine

## 2019-02-04 DIAGNOSIS — Z96651 Presence of right artificial knee joint: Secondary | ICD-10-CM | POA: Insufficient documentation

## 2019-02-04 DIAGNOSIS — Z7982 Long term (current) use of aspirin: Secondary | ICD-10-CM | POA: Insufficient documentation

## 2019-02-04 DIAGNOSIS — I10 Essential (primary) hypertension: Secondary | ICD-10-CM | POA: Insufficient documentation

## 2019-02-04 DIAGNOSIS — I2510 Atherosclerotic heart disease of native coronary artery without angina pectoris: Secondary | ICD-10-CM | POA: Insufficient documentation

## 2019-02-04 DIAGNOSIS — E039 Hypothyroidism, unspecified: Secondary | ICD-10-CM | POA: Insufficient documentation

## 2019-02-04 DIAGNOSIS — J219 Acute bronchiolitis, unspecified: Secondary | ICD-10-CM | POA: Insufficient documentation

## 2019-02-04 DIAGNOSIS — R05 Cough: Secondary | ICD-10-CM | POA: Diagnosis not present

## 2019-02-04 DIAGNOSIS — Z79899 Other long term (current) drug therapy: Secondary | ICD-10-CM | POA: Insufficient documentation

## 2019-02-04 DIAGNOSIS — R0602 Shortness of breath: Secondary | ICD-10-CM

## 2019-02-04 LAB — CBC
HCT: 31.8 % — ABNORMAL LOW (ref 36.0–46.0)
Hemoglobin: 9.6 g/dL — ABNORMAL LOW (ref 12.0–15.0)
MCH: 25.9 pg — ABNORMAL LOW (ref 26.0–34.0)
MCHC: 30.2 g/dL (ref 30.0–36.0)
MCV: 85.9 fL (ref 80.0–100.0)
Platelets: 360 10*3/uL (ref 150–400)
RBC: 3.7 MIL/uL — ABNORMAL LOW (ref 3.87–5.11)
RDW: 16.2 % — ABNORMAL HIGH (ref 11.5–15.5)
WBC: 11.7 10*3/uL — ABNORMAL HIGH (ref 4.0–10.5)
nRBC: 0 % (ref 0.0–0.2)

## 2019-02-04 LAB — BASIC METABOLIC PANEL
Anion gap: 10 (ref 5–15)
BUN: 19 mg/dL (ref 8–23)
CO2: 26 mmol/L (ref 22–32)
Calcium: 8.9 mg/dL (ref 8.9–10.3)
Chloride: 101 mmol/L (ref 98–111)
Creatinine, Ser: 0.85 mg/dL (ref 0.44–1.00)
GFR calc Af Amer: >60 mL/min (ref >60)
GFR calc non Af Amer: >60 mL/min (ref >60)
Glucose, Bld: 91 mg/dL (ref 70–99)
Potassium: 3.5 mmol/L (ref 3.5–5.1)
Sodium: 137 mmol/L (ref 135–145)

## 2019-02-04 LAB — LACTIC ACID, PLASMA: Lactic Acid, Venous: 0.9 mmol/L (ref 0.5–1.9)

## 2019-02-04 MED ORDER — IOPAMIDOL (ISOVUE-370) INJECTION 76%
75.0000 mL | Freq: Once | INTRAVENOUS | Status: AC | PRN
  Administered 2019-02-04: 75 mL via INTRAVENOUS

## 2019-02-04 MED ORDER — AZITHROMYCIN 250 MG PO TABS
500.0000 mg | ORAL_TABLET | Freq: Once | ORAL | Status: AC
  Administered 2019-02-04: 500 mg via ORAL
  Filled 2019-02-04: qty 2

## 2019-02-04 MED ORDER — AZITHROMYCIN 250 MG PO TABS: 250.0000 mg | ORAL_TABLET | Freq: Every day | ORAL | 0 refills | Status: DC

## 2019-02-04 NOTE — ED Triage Notes (Signed)
Patient reports she was sent from PCP after CT to r/o Covid. C/o dry cough with SOB with intermittent fever x3 weeks. States intermittent N/V x3 weeks. Patient becomes SOB during conversation in triage.

## 2019-02-04 NOTE — Telephone Encounter (Signed)
Spoke to patient about CT results. Advised to go to the ER for concern of Covid 19. She agreed to go to the ER

## 2019-02-04 NOTE — ED Notes (Signed)
Ambulated pt in room, pt's O2 sats were in the mid 90's.  Pt became very short of breath.  Pt's O2 sats at rest are 100%

## 2019-02-04 NOTE — ED Provider Notes (Signed)
[facility]-EMERGENCY DEPT Provider Note   CSN: [id number] Arrival date & time: 02/04/19  1441    History   Chief Complaint Chief Complaint  Patient presents with   Shortness of Breath   Cough    HPI Julie Rice is a 83 y.o. female.   HPI Pt started having a cough several weeks ago.  She had been having some trouble with fever as well.  After a few days the fever resolved but she still has been coughing and feeling short of breath.  Her last fever was on Friday when it was 100.9.  She has a sore throat and the cough is dry.  It gets worse at night. She has not been out of her house for several weeks.  She had an e visit with her doctor today. She had an outpatient CT scan. Past Medical History:  Diagnosis Date   Anemia    Anxiety    Bipolar disorder (HCC)    Blood transfusion 1991   autologous pts own blood given    CAD (coronary artery disease)    Cataract    Chest pain    "@ rest, lying down, w/exertion"   Chronic back pain    "mostly lower back but I do have upper back pain regularly" (04/06/2018)   COPD (chronic obstructive pulmonary disease) (HCC)    Depression    Esophageal stricture    Fecal occult blood test positive    Fibromyalgia    GERD (gastroesophageal reflux disease)    Heart murmur    "slight" (04/06/2018)   Hiatal hernia    Hyperlipidemia    Patient denies   Hypertension    Internal hemorrhoids    Lumbago    Mitral regurgitation    Osteoarthritis    Osteoporosis    Pneumonia ~ 01/2018   PONV (postoperative nausea and vomiting)    severe ponv, "in the past" (04/06/2018)   Rectal bleeding    Rheumatoid arthritis (HCC)    Spondylosis    TIA (transient ischemic attack) 2013   Urinary incontinence    wears depends    Patient Active Problem List   Diagnosis Date Noted   Esophageal motility disorder    Esophageal dysmotility    Loss of weight    Moderate protein-calorie malnutrition  (HCC)    Hematemesis 04/06/2018   Periesophageal hiatal hernia 08/02/2016   Dysphagia 05/10/2016   Esophageal stricture 05/10/2016   Epigastric pain 05/10/2016   Generalized anxiety disorder 02/06/2012   Major depressive disorder, recurrent (HCC) 02/05/2012   Hypokalemia 11/09/2011   Nausea and vomiting in adult 11/08/2011   Dehydration 11/08/2011   Tremors of nervous system 11/08/2011   Hypothyroidism 05/10/2008   Dyslipidemia 05/10/2008   UNSPECIFIED ANEMIA 05/10/2008   CAROTID ARTERY DISEASE 05/10/2008   Transient cerebral ischemia 05/10/2008   Chronic back pain 05/10/2008   OSTEOPENIA 05/10/2008   ESOPHAGEAL STRICTURE 01/19/2008   Essential hypertension 04/29/2007   GERD 04/29/2007   Osteoarthritis 04/29/2007   SPONDYLOSIS 04/29/2007    Past Surgical History:  Procedure Laterality Date   ABDOMINAL HYSTERECTOMY  1982   BACK SURGERY     BALLOON DILATION N/A 09/21/2018   Procedure: BALLOON DILATION;  Surgeon: Hilarie FredricksonPerry, John N, MD;  Location: [facility];  Service: Endoscopy;  Laterality: N/A;   BLADDER SUSPENSION  1980's   BOTOX INJECTION N/A 09/21/2018   Procedure: BOTOX INJECTION;  Surgeon: Hilarie FredricksonPerry, John N, MD;  Location: [facility];  Service: Endoscopy;  Laterality: N/A;  CATARACT EXTRACTION W/ INTRAOCULAR LENS  IMPLANT, BILATERAL Bilateral 2010   DILATION AND CURETTAGE OF UTERUS  1961   ESOPHAGEAL MANOMETRY N/A 03/25/2018   Procedure: ESOPHAGEAL MANOMETRY (EM);  Surgeon: Napoleon FormNandigam, Kavitha V, MD;  Location: [facility];  Service: Endoscopy;  Laterality: N/A;   ESOPHAGOGASTRODUODENOSCOPY (EGD) WITH PROPOFOL N/A 04/07/2018   Procedure: ESOPHAGOGASTRODUODENOSCOPY (EGD) WITH PROPOFOL;  Surgeon: Sherrilyn Ristanis, Henry L III, MD;  [facility];  Service: Gastroenterology;  Laterality: N/A;   ESOPHAGOGASTRODUODENOSCOPY (EGD) WITH PROPOFOL N/A 09/21/2018   Procedure: ESOPHAGOGASTRODUODENOSCOPY (EGD) WITH PROPOFOL;  Surgeon: Hilarie FredricksonPerry, John N, MD;   Location: [facility];  Service: Endoscopy;  Laterality: N/A;   HERNIA REPAIR     HIATAL HERNIA REPAIR N/A 08/02/2016   Procedure: LAPAROSCOPIC REPAIR OF LARGE  HIATAL HERNIA;  Surgeon: Glenna FellowsBenjamin Hoxworth, MD;  Location: [facility];  Service: General;  Laterality: N/A;   JOINT REPLACEMENT     LAPAROSCOPIC NISSEN FUNDOPLICATION N/A 08/02/2016   Procedure: LAPAROSCOPIC NISSEN FUNDOPLICATION;  Surgeon: Glenna FellowsBenjamin Hoxworth, MD;  Location: [facility];  Service: General;  Laterality: N/A;   LUMBAR LAMINECTOMY  1990; 1994; [id number];9604; 1997;2000   "I've got 2 stainless steel rods; 6 screws; 2 ray cages"took bone from right hip to put in back   TONSILLECTOMY AND ADENOIDECTOMY  1945   TOTAL KNEE ARTHROPLASTY Right ~ 1996   TUBAL LIGATION  ~ 1976     OB History   No obstetric history on file.      Home Medications    Prior to Admission medications   Medication Sig Start Date End Date Taking? Authorizing Provider  acetaminophen (TYLENOL) 500 MG tablet Take 1,000 mg by mouth 2 (two) times daily as needed for moderate pain (arthritis).    Yes [provider]  amLODipine (NORVASC) 2.5 MG tablet Take 1 tablet (2.5 mg total) by mouth daily. 06/03/18  Yes Gordy SaversKwiatkowski, Peter F, MD  aspirin 81 MG tablet Take 1 tablet (81 mg total) by mouth daily. For cardiovascular health. 02/07/12  Yes Viviann SpareScott, Margaret A, FNP  clonazePAM (KLONOPIN) 0.5 MG tablet Take 0.5 mg by mouth 3 (three) times daily.  01/05/19  Yes [provider]  Ferrous Sulfate (IRON PO) Take 1 tablet by mouth daily.   Yes [provider]  gabapentin (NEURONTIN) 100 MG capsule TAKE 1 CAPSULE BY MOUTH THREE TIMES A DAY 12/10/18  Yes Nafziger, Kandee Keenory, NP  losartan-hydrochlorothiazide (HYZAAR) 100-25 MG tablet Take 1 tablet by mouth daily. 11/05/18  Yes Nafziger, Kandee Keenory, NP  meloxicam (MOBIC) 15 MG tablet Take 1 tablet (15 mg total) by mouth daily. 11/05/18  Yes Nafziger, Kandee Keenory, NP  ondansetron (ZOFRAN) 4 MG tablet Take one tablet every 4-6 hours as  needed for nausea 03/18/18  Yes Hilarie FredricksonPerry, John N, MD  pantoprazole (PROTONIX) 40 MG tablet Take 1 tablet (40 mg total) by mouth 2 (two) times daily. Patient taking differently: Take 40 mg by mouth daily.  08/31/18  Yes Tressia DanasBeavers, Kimberly, MD  sertraline (ZOLOFT) 100 MG tablet TAKE 1 TABLET BY MOUTH EVERYDAY AT BEDTIME Patient taking differently: Take 100 mg by mouth at bedtime.  11/03/18  Yes Nafziger, Kandee Keenory, NP  sucralfate (CARAFATE) 1 g tablet Take 1 tablet (1 g total) by mouth 4 (four) times daily. Dissolve tablet in 10 ml warm water Patient taking differently: Take 1 g by mouth daily. Dissolve tablet in 10 ml warm water 08/31/18  Yes Beavers, Cala BradfordKimberly, MD  Tetrahydrozoline HCl (VISINE OP) Place 1 drop into both eyes daily as needed (dry eyes).   Yes  [provider]  azithromycin (ZITHROMAX) 250 MG tablet Take 1 tablet (250 mg total) by mouth daily. Take first 2 tablets together, then 1 every day until finished. 02/04/19   Linwood Dibbles, MD  clonazePAM (KLONOPIN) 0.5 MG tablet Take 1 tablet (0.5 mg total) by mouth 2 (two) times daily as needed for anxiety. 1 p.o. twice daily as needed 11/05/18 12/11/18  Shirline Frees, NP  mirtazapine (REMERON SOL-TAB) 15 MG disintegrating tablet TAKE 1 TABLET BY MOUTH EVERYDAY AT BEDTIME Patient not taking: No sig reported 05/04/18   Gordy Savers, MD  montelukast (SINGULAIR) 10 MG tablet Take 1 tablet (10 mg total) by mouth at bedtime. Patient not taking: Reported on 02/04/2019 01/13/19   Shirline Frees, NP  Multiple Vitamin (MULTIVITAMIN) tablet Take 1 tablet by mouth daily. Vitamin supplement. Patient not taking: Reported on 02/04/2019 02/07/12   Viviann Spare, FNP    Family History Family History  Problem Relation Age of Onset   Kidney disease Mother    Hypertension Mother    Heart disease Father 44       MI   Suicidality Son    Colon cancer Neg Hx    Esophageal cancer Neg Hx    Pancreatic cancer Neg Hx    Rectal cancer Neg Hx    Stomach  cancer Neg Hx     Social History Social History   Tobacco Use   Smoking status: Never Smoker   Smokeless tobacco: Never Used  Substance Use Topics   Alcohol use: Never    Frequency: Never   Drug use: Never     Allergies   Morphine and Tape   Review of Systems Review of Systems  All other systems reviewed and are negative.    Physical Exam Updated Vital Signs BP (!) 165/72    Pulse 79    Temp 98.1 F (36.7 C) (Oral)    Resp (!) 21    Ht 1.6 m (5\' 3" )    Wt 68 kg    SpO2 98%    BMI 26.57 kg/m   Physical Exam Vitals signs and nursing note reviewed.  Constitutional:      General: She is not in acute distress.    Appearance: She is well-developed.  HENT:     Head: Normocephalic and atraumatic.     Right Ear: External ear normal.     Left Ear: External ear normal.  Eyes:     General: No scleral icterus.       Right eye: No discharge.        Left eye: No discharge.     Conjunctiva/sclera: Conjunctivae normal.  Neck:     Musculoskeletal: Neck supple.     Trachea: No tracheal deviation.  Cardiovascular:     Rate and Rhythm: Normal rate and regular rhythm.  Pulmonary:     Effort: Pulmonary effort is normal. No respiratory distress.     Breath sounds: Normal breath sounds. No stridor. No wheezing or rales.  Abdominal:     General: Bowel sounds are normal. There is no distension.     Palpations: Abdomen is soft.     Tenderness: There is no abdominal tenderness. There is no guarding or rebound.  Musculoskeletal:        General: No tenderness.  Skin:    General: Skin is warm and dry.     Findings: No rash.  Neurological:     Mental Status: She is alert.     Cranial Nerves: No cranial nerve  deficit (no facial droop, extraocular movements intact, no slurred speech).     Sensory: No sensory deficit.     Motor: No abnormal muscle tone or seizure activity.     Coordination: Coordination normal.      ED Treatments / Results  Labs (all labs ordered are  listed, but only abnormal results are displayed) Labs Reviewed  CBC - Abnormal; Notable for the following components:      Result Value   WBC 11.7 (*)    RBC 3.70 (*)    Hemoglobin 9.6 (*)    HCT 31.8 (*)    MCH 25.9 (*)    RDW 16.2 (*)    All other components within normal limits  BASIC METABOLIC PANEL  LACTIC ACID, PLASMA    EKG None  Radiology Ct Angio Chest W/cm &/or Wo Cm  Result Date: 02/04/2019 CLINICAL DATA:  Shortness of breath. Chest tightness. Cough. No recent fever. History of pneumonia in 10/2018. EXAM: CT ANGIOGRAPHY CHEST WITH CONTRAST TECHNIQUE: Multidetector CT imaging of the chest was performed using the standard protocol during bolus administration of intravenous contrast. Multiplanar CT image reconstructions and MIPs were obtained to evaluate the vascular anatomy. CONTRAST:  75mL ISOVUE-370 IOPAMIDOL (ISOVUE-370) INJECTION 76% COMPARISON:  Chest radiographs 01/11/2019 and CTA 12/25/2017 FINDINGS: Cardiovascular: Pulmonary arterial opacification is adequate without evidence of emboli. There is mild thoracic aortic atherosclerosis without evidence of dissection or aneurysm. The heart is slightly enlarged. There is no pericardial effusion. Mediastinum/Nodes: Small sliding hiatal hernia. Increased diffuse distension of the thoracic esophagus containing a large amount of fluid and some air. No enlarged axillary, mediastinal, or hilar lymph nodes. Unremarkable thyroid. Lungs/Pleura: No pleural effusion or pneumothorax. Lateral right upper lobe pneumonia on the prior CTA has resolved with mild scarring now present in this location. There are faint centrilobular ground-glass nodules diffusely throughout both lungs, there is mild bronchial wall thickening. Scattered areas of tree-in-bud nodularity are present peripherally in both lungs including in the lower lobes and lateral left upper lobe/lingula. Small lung nodules are present bilaterally, some of which are unchanged while others  were obscured by the right upper lobe consolidation on the prior CTA and with others clearly being new (including the largest nodule which measures 7 mm laterally in the left lower lobe on image 103 of series 8). Scattered calcified granulomas are present bilaterally. There is mild centrilobular emphysema. Upper Abdomen: No acute finding. Musculoskeletal: Severe lower thoracic disc and facet degeneration. Partially visualized posterior fusion in the upper lumbar spine. Review of the MIP images confirms the above findings. IMPRESSION: 1. No evidence of pulmonary emboli. 2. Diffuse centrilobular ground-glass nodules as well as increased number of scattered discrete bilateral lung nodules and some areas of mild tree-in-bud nodularity. Considerations include respiratory bronchiolitis, endobronchial infection, and subacute hypersensitivity pneumonitis. A component of aspiration is also possible given large volume of fluid distending the esophagus. Non-contrast chest CT at 3-6 months is recommended. If the nodules are stable at time of repeat CT, then future CT at 18-24 months (from today's scan) is considered optional for low-risk patients, but is recommended for high-risk patients. This recommendation follows the consensus statement: Guidelines for Management of Incidental Pulmonary Nodules Detected on CT Images: From the Fleischner Society 2017; Radiology 2017; 284:228-243. 3. Aortic Atherosclerosis (ICD10-I70.0) and Emphysema (ICD10-J43.9). Electronically Signed   By: Sebastian Ache M.D.   On: 02/04/2019 13:44    Procedures Procedures (including critical care time)  Medications Ordered in ED Medications  azithromycin (ZITHROMAX) tablet 500 mg (  500 mg Oral Given 02/04/19 1532)     Initial Impression / Assessment and Plan / ED Course  I have reviewed the triage vital signs and the nursing notes.  Pertinent labs & imaging results that were available during my care of the patient were reviewed by me and  considered in my medical decision making (see chart for details).  Clinical Course as of Feb 04 1804  Thu Feb 04, 2019  1518 RR is19 at the bedside   [JK]    Clinical Course User Index [JK] Linwood Dibbles, MD     Patient presented to the emergency room with complaints of cough and an abnormal CT scan.  Patient had respiratory symptoms and fever last week.  She continues to cough.  She has been feeling short of breath when she walks around.  Patient's outpatient CT scan does demonstrate findings concerning for possible bronchiolitis and a covid 19 illness is certainly in the differential.  Fortunately the patient is afebrile.  Her laboratory tests are reassuring.  No signs of sepsis or systemic infection.  She does get short of breath when she walks around but her sats remained in the 90s and is at 100 at rest.  I do not think at this time she requires admission to the hospital.  I will have her continue to isolate at home.  I will give her a course of azithromycin in case this is an atypical bacterial infection.  Kyasia C Overley was evaluated in Emergency Department on 02/04/2019 for the symptoms described in the history of present illness. She was evaluated in the context of the global COVID-19 pandemic, which necessitated consideration that the patient might be at risk for infection with the SARS-CoV-2 virus that causes COVID-19. Institutional protocols and algorithms that pertain to the evaluation of patients at risk for COVID-19 are in a state of rapid change based on information released by regulatory bodies including the CDC and federal and state organizations. These policies and algorithms were followed during the patient's care in the ED.   Final Clinical Impressions(s) / ED Diagnoses   Final diagnoses:  Bronchiolitis    ED Discharge Orders         Ordered    azithromycin (ZITHROMAX) 250 MG tablet  Daily     02/04/19 1803           Linwood Dibbles, MD 02/04/19 1807

## 2019-02-04 NOTE — Progress Notes (Addendum)
Virtual Visit via Telephone Note  I connected with Julie Rice on 02/04/19 at 11:00 AM EDT by telephone and verified that I am speaking with the correct person using two identifiers.   I discussed the limitations, risks, security and privacy concerns of performing an evaluation and management service by telephone and the availability of in person appointments. I also discussed with the patient that there may be a patient responsible charge related to this service. The patient expressed understanding and agreed to proceed.  Location patient: home Location provider: work or home office Participants present for the call: patient, provider Patient did not have a visit in the prior 7 days to address this/these issue(s).   History of Present Illness: 83 year old female who is following up today via phone visit for shortness of breath.  This of breath originally started back in January after she was diagnosed with pneumonia of left lower lobe, she was treated and subsequently pneumonia had resolved.  Unfortunately she continued to have shortness of breath and a chronic nonproductive cough.  Spoke last week via my chart and she reported having a fever to 100.9 degrees.  This time she also reported worsening shortness of breath and feeling shaky.  She had a burning sensation across her chest and was monitoring her blood oxygen level at home which time she reported an oxygen saturation of 88% and a heart rate of 196.  At home she was doing breathing exercises and her heart rate slowly came down 276 and her oxygen came up to 93% on room air.  At the end of that day her heart rate was 95 oxygen saturation continued to be in the low 90s.  She was advised to go to the emergency room for further evaluation but she failed to go  Today she sent a MyChart message informing me that she was starting to feel little bit improved but continued to be significantly short of breath.  Her temperature has been within  normal limits for the last 3 days and her heart rate was staying around 85 to 90 bpm.   We spoke today over the phone and he reports significant shortness of breath more with exertion but also with rest.  She has been monitoring her oxygen saturation and they have been between 88 and 91% and she continues to have a nonproductive cough.  Asked her to get up and walk to the kitchen with her oxygen saturation monitor on her finger with walking her oxygen saturation went up to 94 to 96% but she sounded much more short of breath with audible wheezing.  After she sat back down her oxygen saturation dropped originally to 88% but then bounced around 91 to 94%.  Shortness of breath appeared to improve minimally.  She does have an appointment with pulmonary on 02/23/2019  She has not been of her home and is not concern for COVID-19.   Observations/Objective: Patient sounds cheerful and well on the phone.  appreciate  SOB. Speech and thought processing are grossly intact. Patient reported vitals:  Assessment and Plan: 1. Shortness of breath -Ideally would like her to go the emergency room but she is reluctant to do so.  Will get stat CT of the chest today to rule out PE, recurrent pneumonia, or other cardiopulmonary issue. - CT Angio Chest W/Cm &/Or Wo Cm; Future -Depending on what the CAT scan shows we will either treat or sent to the emergency room for further evaluation.  Follow Up Instructions:  I did  not refer this patient for an OV in the next 24 hours for this/these issue(s).  I discussed the assessment and treatment plan with the patient. The patient was provided an opportunity to ask questions and all were answered. The patient agreed with the plan and demonstrated an understanding of the instructions.   The patient was advised to call back or seek an in-person evaluation if the symptoms worsen or if the condition fails to improve as anticipated.  I provided of non-face-to-face  time during this encounter.   Shirline Frees, NP

## 2019-02-04 NOTE — Discharge Instructions (Addendum)
Take the medications as prescribed, follow-up with your primary care doctor.  Continue with self isolation until your symptoms have all resolved.  Return to the emergency room for high fevers, shortness of breath or other concerning symptoms

## 2019-02-06 ENCOUNTER — Other Ambulatory Visit: Payer: Self-pay | Admitting: Adult Health

## 2019-02-08 ENCOUNTER — Other Ambulatory Visit: Payer: Self-pay | Admitting: Adult Health

## 2019-02-08 DIAGNOSIS — Z760 Encounter for issue of repeat prescription: Secondary | ICD-10-CM

## 2019-02-08 NOTE — Telephone Encounter (Signed)
DENIED.  FILLED FOR 2 MONTHS ON 01/13/2019.  MESSAGE SENT TO THE PHARMACY.

## 2019-02-11 ENCOUNTER — Other Ambulatory Visit: Payer: Self-pay | Admitting: Adult Health

## 2019-02-11 MED ORDER — BENZONATATE 200 MG PO CAPS: 200.0000 mg | ORAL_CAPSULE | Freq: Two times a day (BID) | ORAL | 1 refills | Status: DC | PRN

## 2019-02-19 ENCOUNTER — Telehealth: Payer: Medicare Other | Admitting: Family

## 2019-02-19 DIAGNOSIS — R112 Nausea with vomiting, unspecified: Secondary | ICD-10-CM

## 2019-02-19 NOTE — Progress Notes (Signed)
Based on what you shared with me, I feel your condition warrants further evaluation and I recommend that you be seen for a face to face office visit.     NOTE: If you entered your credit card information for this eVisit, you will not be charged. You may see a "hold" on your card for the $35 but that hold will drop off and you will not have a charge processed.  If you are having a true medical emergency please call 911.  If you need an urgent face to face visit, [facility] has four urgent care centers for your convenience.    PLEASE NOTE: THE [facility] LOCATIONS AND URGENT CARE CLINICS DO NOT HAVE THE TESTING FOR CORONAVIRUS COVID19 AVAILABLE.  IF YOU FEEL YOU NEED THIS TEST YOU MUST GO TO A TRIAGE LOCATION AT ONE OF THE HOSPITAL EMERGENCY DEPARTMENTS   [email or web address] to reserve your spot online an avoid wait times  [facility] [address], [address] [place], [place] [address] Modified hours of operation: Monday-Friday, 10 AM to 6 PM  Saturday & Sunday 10 AM to 4 PM *Across the street from [employer]  [facility] (New Address!) [address], [address] [place], [place] [address] *Just off [address], across the road from [employer]* Modified hours of operation: Monday-Friday, 10 AM to 5 PM  Closed Saturday & Sunday   The following sites will take your insurance:  . [facility]  [phone] Get Driving Directions Find a Provider at this Location  [address] [place], [place] [address] . 10 am to 8 pm Monday-Friday . 12 pm to 8 pm Saturday-Sunday   . [facility]  [phone] Get Driving Directions Find a Provider at this Location  [address], [address] [place], [place] [address] . 8 am to 8 pm Monday-Friday . 9 am to 6 pm Saturday . 11 am to 6 pm Sunday   .  [facility] [place]  [phone] Get Driving Directions  [address] [place],  [address] . 8 am to 8 pm Monday-Friday . 8 am to 4 pm Saturday-Sunday   Your e-visit answers were reviewed by a board certified advanced clinical practitioner to complete your personal care plan.  Thank you for using e-Visits. 

## 2019-02-23 ENCOUNTER — Ambulatory Visit: Payer: Self-pay | Admitting: Pulmonary Disease

## 2019-02-23 ENCOUNTER — Other Ambulatory Visit: Payer: Self-pay

## 2019-02-23 ENCOUNTER — Ambulatory Visit: Payer: Medicare Other | Admitting: Adult Health

## 2019-02-23 ENCOUNTER — Encounter: Payer: Self-pay | Admitting: Adult Health

## 2019-02-23 DIAGNOSIS — K219 Gastro-esophageal reflux disease without esophagitis: Secondary | ICD-10-CM

## 2019-02-23 DIAGNOSIS — R9389 Abnormal findings on diagnostic imaging of other specified body structures: Secondary | ICD-10-CM

## 2019-02-23 DIAGNOSIS — R0602 Shortness of breath: Secondary | ICD-10-CM | POA: Diagnosis not present

## 2019-02-23 DIAGNOSIS — R911 Solitary pulmonary nodule: Secondary | ICD-10-CM

## 2019-02-23 MED ORDER — ALBUTEROL SULFATE HFA 108 (90 BASE) MCG/ACT IN AERS: 2.0000 | INHALATION_SPRAY | Freq: Four times a day (QID) | RESPIRATORY_TRACT | 6 refills | Status: DC | PRN

## 2019-02-23 NOTE — Assessment & Plan Note (Signed)
Suspect this Is multifactoral in nature with deconditioning, mulitple co-morbidities  Also worried for ILD , possible Rheumatoid Arthritis will need to be ruled out along with other possible autoimmune/CTD if indicated. CT changes may be due to silent aspirations due to her chronic GI issues with GERD/esophageal dysmotility .   Plan  Patient Instructions  Add Pepcid 20mg  At bedtime   GERD diet . No eating 3 hrs before bedtime .  Delsym 2 tsp Twice daily  As needed  Cough .  Tessalon Three times a day  As needed  Cough .  Albuterol Inhaler 1-2 puffs every 6hr as needed for wheezing/shortness of breath  Follow up with Rheumatology as planned  Follow up in 2 weeks with televisit with Julie Jeanbaptiste NP .  Follow up in 3 months with Julie Rice  With PFTs and As needed   HRCT chest in 3 months prior to visit with Julie Rice   Please contact office for sooner follow up if symptoms do not improve or worsen or seek emergency care

## 2019-02-23 NOTE — Progress Notes (Signed)
@Patient  ID: Julie Rice, female    DOB: 08-28-1936, 83 y.o.   MRN: [id number]  Chief Complaint  Patient presents with   Follow-up    f/u after covid isolation. States she developed a fever recently and was seen in ED. States her SOB has gotten worse. Denies current fever.     Referring provider: Shirline Frees, NP  HPI: 83 year old female never smoker seen for initial pulmonary consult December 31, 2017 for cough and shortness of breath for several years.  History of recurrent pneumonia Medical history significant for esophageal dysmotility, GERD  ,  large symptomatic hiatal hernia status post Nissen fundoplication September 2017, at esophageal stricture status post dilatation April 2018, upper endoscopy with Botox injection and balloon dilatation of the esophagus November 2019. Patient carries a diagnosis of rheumatoid arthritis however has never seen a rheumatologist.  And is on nonsteroidals.   TEST/EVENTS :  CT chest February 04, 2019- for PE, diffuse groundglass nodules and mild tree-in-bud nodularity, increased diffuse distention of the thoracic esophagus containing a large amount of fluid and air   02/23/2019  Follow up : Cough Rodena Goldmann , ER visit  Patient presents for an office visit today.  She complains that over the last 2 years she feels that her breathing is progressively getting worse.  She gets short of breath with activity.  Seems to be able to do less and less without getting out of energy and breath.  She mainly has cough at nighttime.  And intermittent wheezing as predominantly at night as well.  She was recently seen in the emergency room on February 04, 2019 diagnosed with a probable bronchitis.  Due to her fever.  She was placed on COVID precautions/isolation.  Patient says she has self isolated at home.  She has no further fever.  She was treated with a Z-Pak.  A CT chest was done that was negative for PE.  Showed diffuse groundglass nodules and mild tree-in-bud nodularity.   Patient says she is having increased reflux especially at nighttime. She says appetite is fair.  No vomiting.  She does have a history of pneumonia in the past.  Last CT chest in February 2019 she had a necrotic mass versus consolidation with necrosis along the right upper lobe measuring max at 6.5 cm.  Serial x-ray showed resolution with residual scarring.  Patient has an extensive history as above with reflux and esophageal dysmotility.  She did receive a Botox injection and esophageal dilatation in September 2019. O2 saturations are 96% on room air today.  Labs from the ER did reveal stable anemia with a hemoglobin at 9.6.  White count was minimally elevated at 11,000 lactic acid and chemistry panel was unrevealing.  Patient says she carries a diagnosis of rheumatoid arthritis however she is only unmotivated and has not seen a rheumatologist.  She says she does have an upcoming rheumatology appointment but this has been delayed due to the COVID precautions  Allergies  Allergen Reactions   Morphine Other (See Comments)    Flushing, rash, itching   Tape Other (See Comments)    Band aides, adhesive tape Redness and pulls skin off    Immunization History  Administered Date(s) Administered   Influenza Whole 09/04/2007, 08/16/2008   Influenza,inj,Quad PF,6+ Mos 08/17/2013, 09/02/2014   Pneumococcal Conjugate-13 09/02/2014   Pneumococcal Polysaccharide-23 06/01/2013   Td 04/08/2006   Zoster 04/08/2006    Past Medical History:  Diagnosis Date   Anemia    Anxiety  Bipolar disorder (HCC)    Blood transfusion 1991   autologous pts own blood given    CAD (coronary artery disease)    Cataract    Chest pain    "@ rest, lying down, w/exertion"   Chronic back pain    "mostly lower back but I do have upper back pain regularly" (04/06/2018)   COPD (chronic obstructive pulmonary disease) (HCC)    Depression    Esophageal stricture    Fecal occult blood test positive     Fibromyalgia    GERD (gastroesophageal reflux disease)    Heart murmur    "slight" (04/06/2018)   Hiatal hernia    Hyperlipidemia    Patient denies   Hypertension    Internal hemorrhoids    Lumbago    Mitral regurgitation    Osteoarthritis    Osteoporosis    Pneumonia ~ 01/2018   PONV (postoperative nausea and vomiting)    severe ponv, "in the past" (04/06/2018)   Rectal bleeding    Rheumatoid arthritis (HCC)    Spondylosis    TIA (transient ischemic attack) 2013   Urinary incontinence    wears depends    Tobacco History: Social History   Tobacco Use  Smoking Status Never Smoker  Smokeless Tobacco Never Used   Counseling given: Not Answered   Outpatient Medications Prior to Visit  Medication Sig Dispense Refill   acetaminophen (TYLENOL) 500 MG tablet Take 1,000 mg by mouth 2 (two) times daily as needed for moderate pain (arthritis).      amLODipine (NORVASC) 2.5 MG tablet Take 1 tablet (2.5 mg total) by mouth daily. 90 tablet 3   aspirin 81 MG tablet Take 1 tablet (81 mg total) by mouth daily. For cardiovascular health.     benzonatate (TESSALON) 200 MG capsule Take 1 capsule (200 mg total) by mouth 2 (two) times daily as needed for cough. 20 capsule 1   clonazePAM (KLONOPIN) 0.5 MG tablet Take 0.5 mg by mouth 3 (three) times daily.      clonazePAM (KLONOPIN) 0.5 MG tablet TAKE 1 TABLET BY MOUTH TWICE A DAY AS NEEDED FOR ANXIETY 60 tablet 2   Ferrous Sulfate (IRON PO) Take 1 tablet by mouth daily.     gabapentin (NEURONTIN) 100 MG capsule TAKE 1 CAPSULE BY MOUTH THREE TIMES A DAY 270 capsule 1   losartan-hydrochlorothiazide (HYZAAR) 100-25 MG tablet Take 1 tablet by mouth daily. 90 tablet 3   meloxicam (MOBIC) 15 MG tablet Take 1 tablet (15 mg total) by mouth daily. 90 tablet 3   mirtazapine (REMERON SOL-TAB) 15 MG disintegrating tablet TAKE 1 TABLET BY MOUTH EVERYDAY AT BEDTIME 90 tablet 1   montelukast (SINGULAIR) 10 MG tablet Take 1 tablet (10  mg total) by mouth at bedtime. 30 tablet 1   Multiple Vitamin (MULTIVITAMIN) tablet Take 1 tablet by mouth daily. Vitamin supplement.     ondansetron (ZOFRAN) 4 MG tablet Take one tablet every 4-6 hours as needed for nausea 60 tablet 3   pantoprazole (PROTONIX) 40 MG tablet Take 1 tablet (40 mg total) by mouth 2 (two) times daily. (Patient taking differently: Take 40 mg by mouth daily. ) 60 tablet 3   sertraline (ZOLOFT) 100 MG tablet TAKE 1 TABLET BY MOUTH EVERYDAY AT BEDTIME (Patient taking differently: Take 100 mg by mouth at bedtime. ) 90 tablet 1   sucralfate (CARAFATE) 1 g tablet Take 1 tablet (1 g total) by mouth 4 (four) times daily. Dissolve tablet in 10 ml warm water (  Patient taking differently: Take 1 g by mouth daily. Dissolve tablet in 10 ml warm water) 56 tablet 1   Tetrahydrozoline HCl (VISINE OP) Place 1 drop into both eyes daily as needed (dry eyes).     azithromycin (ZITHROMAX) 250 MG tablet Take 1 tablet (250 mg total) by mouth daily. Take first 2 tablets together, then 1 every day until finished. (Patient not taking: Reported on 02/23/2019) 6 tablet 0   No facility-administered medications prior to visit.      Review of Systems:   Constitutional:   No  weight loss, night sweats,  Fevers, chills,  +fatigue, or  lassitude.  HEENT:   No headaches,  Difficulty swallowing,  Tooth/dental problems, or  Sore throat,                No sneezing, itching, ear ache, nasal congestion, post nasal drip,   CV:  No chest pain,  Orthopnea, PND, swelling in lower extremities, anasarca, dizziness, palpitations, syncope.   GI  +gerd  Neg loss of appetite, bloody stools.   Resp:    No chest wall deformity  Skin: no rash or lesions.  GU: no dysuria, change in color of urine, no urgency or frequency.  No flank pain, no hematuria   MS:  +joint pain     Physical Exam  BP 132/88    Pulse 82    Temp 97.7 F (36.5 C) (Oral)    Ht  (1.6 m)    Wt 149 lb (67.6 kg)    SpO2 96%     BMI 26.39 kg/m   GEN: A/Ox3; pleasant , NAD, elderly and frail    HEENT:  /AT,  EACs-clear, TMs-wnl, NOSE-clear, THROAT-clear, no lesions, no postnasal drip or exudate noted.   NECK:  Supple w/ fair ROM; no JVD; normal carotid impulses w/o bruits; no thyromegaly or nodules palpated; no lymphadenopathy.    RESP  Few trace rhonchi   no accessory muscle use, no dullness to percussion  CARD:  RRR, no m/r/g, no peripheral edema, pulses intact, no cyanosis or clubbing.  GI:   Soft & nt; nml bowel sounds; no organomegaly or masses detected.   Musco: Warm bil, arthritic changes in hands.   Neuro: alert, no focal deficits noted.    Skin: Warm, no lesions or rashes    Lab Results:  CBC    Component Value Date/Time   WBC 11.7 (H) 02/04/2019 1543   RBC 3.70 (L) 02/04/2019 1543   HGB 9.6 (L) 02/04/2019 1543   HCT 31.8 (L) 02/04/2019 1543   PLT 360 02/04/2019 1543   MCV 85.9 02/04/2019 1543   MCH 25.9 (L) 02/04/2019 1543   MCHC 30.2 02/04/2019 1543   RDW 16.2 (H) 02/04/2019 1543   LYMPHSABS 1.7 01/12/2019 1427   MONOABS 0.5 01/12/2019 1427   EOSABS 0.7 01/12/2019 1427   BASOSABS 0.2 (H) 01/12/2019 1427    BMET    Component Value Date/Time   NA 137 02/04/2019 1543   K 3.5 02/04/2019 1543   CL 101 02/04/2019 1543   CO2 26 02/04/2019 1543   GLUCOSE 91 02/04/2019 1543   BUN 19 02/04/2019 1543   CREATININE 0.85 02/04/2019 1543   CALCIUM 8.9 02/04/2019 1543   GFRNONAA >60 02/04/2019 1543   GFRAA >60 02/04/2019 1543    BNP No results found for: BNP  ProBNP    Component Value Date/Time   PROBNP 196.0 (H) 01/12/2019 1427    Imaging: Ct Angio Chest W/cm &/or Wo Cm  Result Date: 02/04/2019 CLINICAL DATA:  Shortness of breath. Chest tightness. Cough. No recent fever. History of pneumonia in 10/2018. EXAM: CT ANGIOGRAPHY CHEST WITH CONTRAST TECHNIQUE: Multidetector CT imaging of the chest was performed using the standard protocol during bolus administration of intravenous  contrast. Multiplanar CT image reconstructions and MIPs were obtained to evaluate the vascular anatomy. CONTRAST:  21mL ISOVUE-370 IOPAMIDOL (ISOVUE-370) INJECTION 76% COMPARISON:  Chest radiographs 01/11/2019 and CTA 12/25/2017 FINDINGS: Cardiovascular: Pulmonary arterial opacification is adequate without evidence of emboli. There is mild thoracic aortic atherosclerosis without evidence of dissection or aneurysm. The heart is slightly enlarged. There is no pericardial effusion. Mediastinum/Nodes: Small sliding hiatal hernia. Increased diffuse distension of the thoracic esophagus containing a large amount of fluid and some air. No enlarged axillary, mediastinal, or hilar lymph nodes. Unremarkable thyroid. Lungs/Pleura: No pleural effusion or pneumothorax. Lateral right upper lobe pneumonia on the prior CTA has resolved with mild scarring now present in this location. There are faint centrilobular ground-glass nodules diffusely throughout both lungs, there is mild bronchial wall thickening. Scattered areas of tree-in-bud nodularity are present peripherally in both lungs including in the lower lobes and lateral left upper lobe/lingula. Small lung nodules are present bilaterally, some of which are unchanged while others were obscured by the right upper lobe consolidation on the prior CTA and with others clearly being new (including the largest nodule which measures 7 mm laterally in the left lower lobe on image 103 of series 8). Scattered calcified granulomas are present bilaterally. There is mild centrilobular emphysema. Upper Abdomen: No acute finding. Musculoskeletal: Severe lower thoracic disc and facet degeneration. Partially visualized posterior fusion in the upper lumbar spine. Review of the MIP images confirms the above findings. IMPRESSION: 1. No evidence of pulmonary emboli. 2. Diffuse centrilobular ground-glass nodules as well as increased number of scattered discrete bilateral lung nodules and some areas of  mild tree-in-bud nodularity. Considerations include respiratory bronchiolitis, endobronchial infection, and subacute hypersensitivity pneumonitis. A component of aspiration is also possible given large volume of fluid distending the esophagus. Non-contrast chest CT at 3-6 months is recommended. If the nodules are stable at time of repeat CT, then future CT at 18-24 months (from today's scan) is considered optional for low-risk patients, but is recommended for high-risk patients. This recommendation follows the consensus statement: Guidelines for Management of Incidental Pulmonary Nodules Detected on CT Images: From the Fleischner Society 2017; Radiology 2017; 284:228-243. 3. Aortic Atherosclerosis (ICD10-I70.0) and Emphysema (ICD10-J43.9). Electronically Signed   By: Sebastian Ache M.D.   On: 02/04/2019 13:44      No flowsheet data found.  No results found for: NITRICOXIDE      Assessment & Plan:   GERD GERD and esophageal dysmotility issues.  This is quite chronic in nature.  She is having increased symptoms.  She does eat late at night.  Which I advised her to avoid. We will add in Pepcid at bedtime to see if we can control of her nighttime symptoms. Advised on aspiration precautions.  I do worry that she is having silent aspiration  Plan  Patient Instructions  Add Pepcid 20mg  At bedtime   GERD diet . No eating 3 hrs before bedtime .  Delsym 2 tsp Twice daily  As needed  Cough .  Tessalon Three times a day  As needed  Cough .  Albuterol Inhaler 1-2 puffs every 6hr as needed for wheezing/shortness of breath  Follow up with Rheumatology as planned  Follow up in 2 weeks with televisit with  Nimo Verastegui NP .  Follow up in 3 months with Dr. Sherene SiresWert  With PFTs and As needed   HRCT chest in 3 months prior to visit with Dr. Sherene SiresWert   Please contact office for sooner follow up if symptoms do not improve or worsen or seek emergency care       Abnormal CT of the chest Recent acute bronchitis - appears  to be improving however has diffuse nodularity ? Etiology  Concern from silent microaspiration  Will need follow up HRCT chest in 3 months . Concern for ILD . Glad she is going to rheumatology  Check PFT on return as well   Plan  Patient Instructions  Add Pepcid 20mg  At bedtime   GERD diet . No eating 3 hrs before bedtime .  Delsym 2 tsp Twice daily  As needed  Cough .  Tessalon Three times a day  As needed  Cough .  Albuterol Inhaler 1-2 puffs every 6hr as needed for wheezing/shortness of breath  Follow up with Rheumatology as planned  Follow up in 2 weeks with televisit with Ndea Kilroy NP .  Follow up in 3 months with Dr. Sherene SiresWert  With PFTs and As needed   HRCT chest in 3 months prior to visit with Dr. Sherene SiresWert   Please contact office for sooner follow up if symptoms do not improve or worsen or seek emergency care        Dyspnea Suspect this Is multifactoral in nature with deconditioning, mulitple co-morbidities  Also worried for ILD , possible Rheumatoid Arthritis will need to be ruled out along with other possible autoimmune/CTD if indicated. CT changes may be due to silent aspirations due to her chronic GI issues with GERD/esophageal dysmotility .   Plan  Patient Instructions  Add Pepcid 20mg  At bedtime   GERD diet . No eating 3 hrs before bedtime .  Delsym 2 tsp Twice daily  As needed  Cough .  Tessalon Three times a day  As needed  Cough .  Albuterol Inhaler 1-2 puffs every 6hr as needed for wheezing/shortness of breath  Follow up with Rheumatology as planned  Follow up in 2 weeks with televisit with Dhanush Jokerst NP .  Follow up in 3 months with Dr. Sherene SiresWert  With PFTs and As needed   HRCT chest in 3 months prior to visit with Dr. Sherene SiresWert   Please contact office for sooner follow up if symptoms do not improve or worsen or seek emergency care          Rubye Oaksammy Trayon Krantz, NP 02/23/2019

## 2019-02-23 NOTE — Assessment & Plan Note (Signed)
Recent acute bronchitis - appears to be improving however has diffuse nodularity ? Etiology  Concern from silent microaspiration  Will need follow up HRCT chest in 3 months . Concern for ILD . Glad she is going to rheumatology  Check PFT on return as well   Plan  Patient Instructions  Add Pepcid 20mg  At bedtime   GERD diet . No eating 3 hrs before bedtime .  Delsym 2 tsp Twice daily  As needed  Cough .  Tessalon Three times a day  As needed  Cough .  Albuterol Inhaler 1-2 puffs every 6hr as needed for wheezing/shortness of breath  Follow up with Rheumatology as planned  Follow up in 2 weeks with televisit with [name] NP .  Follow up in 3 months with Dr. Sherene Sires  With PFTs and As needed   HRCT chest in 3 months prior to visit with Dr. Sherene Sires   Please contact office for sooner follow up if symptoms do not improve or worsen or seek emergency care

## 2019-02-23 NOTE — Patient Instructions (Addendum)
Add Pepcid 20mg  At bedtime   GERD diet . No eating 3 hrs before bedtime .  Delsym 2 tsp Twice daily  As needed  Cough .  Tessalon Three times a day  As needed  Cough .  Albuterol Inhaler 1-2 puffs every 6hr as needed for wheezing/shortness of breath  Follow up with Rheumatology as planned  Follow up in 2 weeks with televisit with Verdelle Valtierra NP .  Follow up in 3 months with Dr. Sherene Sires  With PFTs and As needed   HRCT chest in 3 months prior to visit with Dr. Sherene Sires   Please contact office for sooner follow up if symptoms do not improve or worsen or seek emergency care

## 2019-02-23 NOTE — Assessment & Plan Note (Signed)
GERD and esophageal dysmotility issues.  This is quite chronic in nature.  She is having increased symptoms.  She does eat late at night.  Which I advised her to avoid. We will add in Pepcid at bedtime to see if we can control of her nighttime symptoms. Advised on aspiration precautions.  I do worry that she is having silent aspiration  Plan  Patient Instructions  Add Pepcid 20mg  At bedtime   GERD diet . No eating 3 hrs before bedtime .  Delsym 2 tsp Twice daily  As needed  Cough .  Tessalon Three times a day  As needed  Cough .  Albuterol Inhaler 1-2 puffs every 6hr as needed for wheezing/shortness of breath  Follow up with Rheumatology as planned  Follow up in 2 weeks with televisit with Khaza Blansett NP .  Follow up in 3 months with Dr. Sherene Sires  With PFTs and As needed   HRCT chest in 3 months prior to visit with Dr. Sherene Sires   Please contact office for sooner follow up if symptoms do not improve or worsen or seek emergency care

## 2019-02-28 NOTE — Progress Notes (Signed)
Chart and office note reviewed in detail  > agree with a/p as outlined    

## 2019-03-04 ENCOUNTER — Encounter: Payer: Self-pay | Admitting: Adult Health

## 2019-03-04 ENCOUNTER — Ambulatory Visit: Payer: Self-pay | Admitting: Rheumatology

## 2019-03-09 ENCOUNTER — Encounter: Payer: Self-pay | Admitting: Internal Medicine

## 2019-03-09 ENCOUNTER — Ambulatory Visit: Payer: Self-pay | Admitting: Adult Health

## 2019-03-09 ENCOUNTER — Emergency Department (HOSPITAL_COMMUNITY): Payer: Medicare Other

## 2019-03-09 ENCOUNTER — Encounter (HOSPITAL_COMMUNITY): Payer: Self-pay | Admitting: Emergency Medicine

## 2019-03-09 ENCOUNTER — Other Ambulatory Visit: Payer: Self-pay

## 2019-03-09 ENCOUNTER — Telehealth: Payer: Self-pay | Admitting: Internal Medicine

## 2019-03-09 ENCOUNTER — Emergency Department (HOSPITAL_COMMUNITY)
Admission: EM | Admit: 2019-03-09 | Discharge: 2019-03-09 | Disposition: A | Discharge status: Home / Self Care | Payer: Medicare Other | Attending: Emergency Medicine | Admitting: Emergency Medicine

## 2019-03-09 ENCOUNTER — Ambulatory Visit (INDEPENDENT_AMBULATORY_CARE_PROVIDER_SITE_OTHER): Payer: Medicare Other | Admitting: Internal Medicine

## 2019-03-09 VITALS — Ht 63.0 in | Wt 153.0 lb

## 2019-03-09 DIAGNOSIS — R0602 Shortness of breath: Secondary | ICD-10-CM | POA: Diagnosis not present

## 2019-03-09 DIAGNOSIS — M069 Rheumatoid arthritis, unspecified: Secondary | ICD-10-CM | POA: Diagnosis not present

## 2019-03-09 DIAGNOSIS — Z96651 Presence of right artificial knee joint: Secondary | ICD-10-CM | POA: Diagnosis not present

## 2019-03-09 DIAGNOSIS — J449 Chronic obstructive pulmonary disease, unspecified: Secondary | ICD-10-CM | POA: Insufficient documentation

## 2019-03-09 DIAGNOSIS — I10 Essential (primary) hypertension: Secondary | ICD-10-CM | POA: Insufficient documentation

## 2019-03-09 DIAGNOSIS — I2510 Atherosclerotic heart disease of native coronary artery without angina pectoris: Secondary | ICD-10-CM | POA: Diagnosis not present

## 2019-03-09 DIAGNOSIS — I447 Left bundle-branch block, unspecified: Secondary | ICD-10-CM | POA: Diagnosis not present

## 2019-03-09 DIAGNOSIS — Z7982 Long term (current) use of aspirin: Secondary | ICD-10-CM | POA: Diagnosis not present

## 2019-03-09 DIAGNOSIS — Z79899 Other long term (current) drug therapy: Secondary | ICD-10-CM | POA: Diagnosis not present

## 2019-03-09 DIAGNOSIS — K224 Dyskinesia of esophagus: Secondary | ICD-10-CM

## 2019-03-09 DIAGNOSIS — R1310 Dysphagia, unspecified: Secondary | ICD-10-CM | POA: Diagnosis not present

## 2019-03-09 DIAGNOSIS — Z8673 Personal history of transient ischemic attack (TIA), and cerebral infarction without residual deficits: Secondary | ICD-10-CM | POA: Insufficient documentation

## 2019-03-09 DIAGNOSIS — K219 Gastro-esophageal reflux disease without esophagitis: Secondary | ICD-10-CM | POA: Diagnosis not present

## 2019-03-09 DIAGNOSIS — E876 Hypokalemia: Secondary | ICD-10-CM | POA: Diagnosis not present

## 2019-03-09 DIAGNOSIS — Z20828 Contact with and (suspected) exposure to other viral communicable diseases: Secondary | ICD-10-CM | POA: Insufficient documentation

## 2019-03-09 LAB — CBC WITH DIFFERENTIAL/PLATELET
Abs Immature Granulocytes: 0.02 10*3/uL (ref 0.00–0.07)
Basophils Absolute: 0.1 10*3/uL (ref 0.0–0.1)
Basophils Relative: 1 %
Eosinophils Absolute: 0.5 10*3/uL (ref 0.0–0.5)
Eosinophils Relative: 5 %
HCT: 34.1 % — ABNORMAL LOW (ref 36.0–46.0)
Hemoglobin: 10.3 g/dL — ABNORMAL LOW (ref 12.0–15.0)
Immature Granulocytes: 0 %
Lymphocytes Relative: 16 %
Lymphs Abs: 1.5 10*3/uL (ref 0.7–4.0)
MCH: 27.5 pg (ref 26.0–34.0)
MCHC: 30.2 g/dL (ref 30.0–36.0)
MCV: 91.2 fL (ref 80.0–100.0)
Monocytes Absolute: 0.6 10*3/uL (ref 0.1–1.0)
Monocytes Relative: 6 %
Neutro Abs: 6.9 10*3/uL (ref 1.7–7.7)
Neutrophils Relative %: 72 %
Platelets: 271 10*3/uL (ref 150–400)
RBC: 3.74 MIL/uL — ABNORMAL LOW (ref 3.87–5.11)
RDW: 16.4 % — ABNORMAL HIGH (ref 11.5–15.5)
WBC: 9.5 10*3/uL (ref 4.0–10.5)
nRBC: 0 % (ref 0.0–0.2)

## 2019-03-09 LAB — COMPREHENSIVE METABOLIC PANEL
ALT: 14 U/L (ref 0–44)
AST: 18 U/L (ref 15–41)
Albumin: 4 g/dL (ref 3.5–5.0)
Alkaline Phosphatase: 65 U/L (ref 38–126)
Anion gap: 8 (ref 5–15)
BUN: 23 mg/dL (ref 8–23)
CO2: 29 mmol/L (ref 22–32)
Calcium: 8.7 mg/dL — ABNORMAL LOW (ref 8.9–10.3)
Chloride: 100 mmol/L (ref 98–111)
Creatinine, Ser: 1.23 mg/dL — ABNORMAL HIGH (ref 0.44–1.00)
GFR calc Af Amer: 48 mL/min — ABNORMAL LOW (ref >60)
GFR calc non Af Amer: 41 mL/min — ABNORMAL LOW (ref >60)
Glucose, Bld: 111 mg/dL — ABNORMAL HIGH (ref 70–99)
Potassium: 2.9 mmol/L — ABNORMAL LOW (ref 3.5–5.1)
Sodium: 137 mmol/L (ref 135–145)
Total Bilirubin: 0.6 mg/dL (ref 0.3–1.2)
Total Protein: 7.1 g/dL (ref 6.5–8.1)

## 2019-03-09 LAB — SARS CORONAVIRUS 2 BY RT PCR (HOSPITAL ORDER, PERFORMED IN ~~LOC~~ HOSPITAL LAB): SARS Coronavirus 2: NEGATIVE

## 2019-03-09 MED ORDER — POTASSIUM CHLORIDE 10 MEQ/100ML IV SOLN
10.0000 meq | Freq: Once | INTRAVENOUS | Status: AC
  Administered 2019-03-09: 10 meq via INTRAVENOUS
  Filled 2019-03-09: qty 100

## 2019-03-09 MED ORDER — POTASSIUM CHLORIDE CRYS ER 20 MEQ PO TBCR
40.0000 meq | EXTENDED_RELEASE_TABLET | Freq: Once | ORAL | Status: AC
  Administered 2019-03-09: 22:00:00 40 meq via ORAL
  Filled 2019-03-09: qty 2

## 2019-03-09 MED ORDER — POTASSIUM CHLORIDE CRYS ER 20 MEQ PO TBCR: 20.0000 meq | EXTENDED_RELEASE_TABLET | Freq: Every day | ORAL | 0 refills | Status: DC

## 2019-03-09 MED ORDER — POTASSIUM CHLORIDE 10 MEQ/100ML IV SOLN
10.0000 meq | Freq: Once | INTRAVENOUS | Status: AC
  Administered 2019-03-09: 21:00:00 10 meq via INTRAVENOUS
  Filled 2019-03-09: qty 100

## 2019-03-09 NOTE — Telephone Encounter (Signed)
Spoke with patient. She verbalized understanding of TP's recs. She stated that she would go to the ED now. Advised her to call us back if she needed anything.   Nothing further needed at time of call.

## 2019-03-09 NOTE — ED Triage Notes (Signed)
Pt reports that she been SOB, body aches and intermittent fevers since March. Reports that her doctors talked today and called her and told her to go to [facility] ED for Covid testing.

## 2019-03-09 NOTE — Discharge Instructions (Signed)
Your COVID test today is negative. Your potassium was found to be low. You received supplementation in the ED. You have a prescription for additional potassium to take at home. Please follow-up with your primary care provider/pulmonologist.

## 2019-03-09 NOTE — ED Provider Notes (Signed)
[facility] Note   CSN: [id number] Arrival date & time: 03/09/19  1620    History   Chief Complaint Chief Complaint  Patient presents with   Shortness of Breath   Generalized Body Aches    HPI Julie Rice is a 83 y.o. female.     Patient was seen in the ED in April. Per the physician's note: "Patient presented to the emergency room with complaints of cough and an abnormal CT scan.  Patient had respiratory symptoms and fever last week.  She continues to cough.  She has been feeling short of breath when she walks around.  Patient's outpatient CT scan does demonstrate findings concerning for possible bronchiolitis and a covid 19 illness is certainly in the differential.  Fortunately the patient is afebrile.  Her laboratory tests are reassuring.  No signs of sepsis or systemic infection.  She does get short of breath when she walks around but her sats remained in the 90s and is at 100 at rest.  I do not think at this time she requires admission to the hospital.  I will have her continue to isolate at home.  I will give her a course of azithromycin in case this is an atypical bacterial infection."  Patient states that her symptoms improved. She had been feeling well, other than occasional shortness of breath with exertion, for one week prior to this past Sunday. On Sunday, she developed a fever of 100.9 at home. Fever has responded to antipyretic, but fever has been persistent over the last several days. She states that her shortness of breath is getting more pronounced with activity, and occasionally has shortness of breath at rest. Occasional non-productive cough. She also reports new onset of bilateral leg discomfort, described as a "deep pain". She has taken tylenol today, and is currently afebrile. She has been in contact with her pulmonologist, who is concerned for COVID, and recommended patient present to the ED for evaluation. Patient with  no known exposure to others with COVID symptoms.  The history is provided by the patient and medical records.  Shortness of Breath  Severity:  Moderate Onset quality:  Gradual Duration:  1 month Timing:  Intermittent Progression:  Worsening Chronicity:  Recurrent Context: activity   Associated symptoms: cough   Associated symptoms: no chest pain and no sputum production     Past Medical History:  Diagnosis Date   Anemia    Anxiety    Bipolar disorder (HCC)    Blood transfusion 1991   autologous pts own blood given    CAD (coronary artery disease)    Cataract    Chest pain    "@ rest, lying down, w/exertion"   Chronic back pain    "mostly lower back but I do have upper back pain regularly" (04/06/2018)   COPD (chronic obstructive pulmonary disease) (HCC)    Depression    Esophageal stricture    Fecal occult blood test positive    Fibromyalgia    GERD (gastroesophageal reflux disease)    Heart murmur    "slight" (04/06/2018)   Hiatal hernia    Hyperlipidemia    Patient denies   Hypertension    Internal hemorrhoids    Lumbago    Mitral regurgitation    Osteoarthritis    Osteoporosis    Pneumonia ~ 01/2018   PONV (postoperative nausea and vomiting)    severe ponv, "in the past" (04/06/2018)   Rectal bleeding    Rheumatoid arthritis (  HCC)    Spondylosis    TIA (transient ischemic attack) 2013   Urinary incontinence    wears depends    Patient Active Problem List   Diagnosis Date Noted   Abnormal CT of the chest 02/23/2019   Esophageal motility disorder    Esophageal dysmotility    Loss of weight    Moderate protein-calorie malnutrition (HCC)    Hematemesis 04/06/2018   Periesophageal hiatal hernia 08/02/2016   Dysphagia 05/10/2016   Esophageal stricture 05/10/2016   Epigastric pain 05/10/2016   Generalized anxiety disorder 02/06/2012   Major depressive disorder, recurrent (HCC) 02/05/2012   Hypokalemia 11/09/2011    Nausea and vomiting in adult 11/08/2011   Dehydration 11/08/2011   Tremors of nervous system 11/08/2011   Hypothyroidism 05/10/2008   Dyslipidemia 05/10/2008   UNSPECIFIED ANEMIA 05/10/2008   CAROTID ARTERY DISEASE 05/10/2008   Transient cerebral ischemia 05/10/2008   Chronic back pain 05/10/2008   OSTEOPENIA 05/10/2008   Dyspnea 05/10/2008   ESOPHAGEAL STRICTURE 01/19/2008   Essential hypertension 04/29/2007   GERD 04/29/2007   Osteoarthritis 04/29/2007   SPONDYLOSIS 04/29/2007    Past Surgical History:  Procedure Laterality Date   ABDOMINAL HYSTERECTOMY  1982   BACK SURGERY     BALLOON DILATION N/A 09/21/2018   Procedure: BALLOON DILATION;  Surgeon: Hilarie Fredrickson, MD;  Location: [facility];  Service: Endoscopy;  Laterality: N/A;   BLADDER SUSPENSION  1980's   BOTOX INJECTION N/A 09/21/2018   Procedure: BOTOX INJECTION;  Surgeon: Hilarie Fredrickson, MD;  Location: [facility];  Service: Endoscopy;  Laterality: N/A;   CATARACT EXTRACTION W/ INTRAOCULAR LENS  IMPLANT, BILATERAL Bilateral 2010   DILATION AND CURETTAGE OF UTERUS  1961   ESOPHAGEAL MANOMETRY N/A 03/25/2018   Procedure: ESOPHAGEAL MANOMETRY (EM);  Surgeon: Napoleon Form, MD;  Location: [facility];  Service: Endoscopy;  Laterality: N/A;   ESOPHAGOGASTRODUODENOSCOPY (EGD) WITH PROPOFOL N/A 04/07/2018   Procedure: ESOPHAGOGASTRODUODENOSCOPY (EGD) WITH PROPOFOL;  Surgeon: Sherrilyn Rist, MD;  Location: [facility] ENDOSCOPY;  Service: Gastroenterology;  Laterality: N/A;   ESOPHAGOGASTRODUODENOSCOPY (EGD) WITH PROPOFOL N/A 09/21/2018   Procedure: ESOPHAGOGASTRODUODENOSCOPY (EGD) WITH PROPOFOL;  Surgeon: Hilarie Fredrickson, MD;  Location: [facility];  Service: Endoscopy;  Laterality: N/A;   HERNIA REPAIR     HIATAL HERNIA REPAIR N/A 08/02/2016   Procedure: LAPAROSCOPIC REPAIR OF LARGE  HIATAL HERNIA;  Surgeon: Glenna Fellows, MD;  Location: [facility];  Service: General;  Laterality: N/A;   JOINT  REPLACEMENT     LAPAROSCOPIC NISSEN FUNDOPLICATION N/A 08/02/2016   Procedure: LAPAROSCOPIC NISSEN FUNDOPLICATION;  Surgeon: Glenna Fellows, MD;  Location: [facility];  Service: General;  Laterality: N/A;   LUMBAR LAMINECTOMY  1990; 1994; 0881;1031   "I've got 2 stainless steel rods; 6 screws; 2 ray cages"took bone from right hip to put in back   TONSILLECTOMY AND ADENOIDECTOMY  1945   TOTAL KNEE ARTHROPLASTY Right ~ 1996   TUBAL LIGATION  ~ 1976     OB History   No obstetric history on file.      Home Medications    Prior to Admission medications   Medication Sig Start Date End Date Taking? Authorizing Provider  acetaminophen (TYLENOL) 500 MG tablet Take 1,000 mg by mouth 2 (two) times daily as needed for moderate pain (arthritis).     [provider]  albuterol (VENTOLIN HFA) 108 (90 Base) MCG/ACT inhaler Inhale 2 puffs into the lungs every 6 (six) hours as needed for wheezing or shortness of breath.  02/23/19   [name], Virgel Bouquet, NP  amLODipine (NORVASC) 2.5 MG tablet Take 1 tablet (2.5 mg total) by mouth daily. 06/03/18   Gordy Savers, MD  aspirin 81 MG tablet Take 1 tablet (81 mg total) by mouth daily. For cardiovascular health. 02/07/12   Viviann Spare, FNP  benzonatate (TESSALON) 200 MG capsule Take 1 capsule (200 mg total) by mouth 2 (two) times daily as needed for cough. 02/11/19   Nafziger, [name], NP  clonazePAM (KLONOPIN) 0.5 MG tablet Take 0.5 mg by mouth 3 (three) times daily.  01/05/19   [provider]  Ferrous Sulfate (IRON PO) Take 1 tablet by mouth daily.    [provider]  gabapentin (NEURONTIN) 100 MG capsule TAKE 1 CAPSULE BY MOUTH THREE TIMES A DAY 12/10/18   Nafziger, [name], NP  losartan-hydrochlorothiazide (HYZAAR) 100-25 MG tablet Take 1 tablet by mouth daily. 11/05/18   Nafziger, [name], NP  meloxicam (MOBIC) 15 MG tablet Take 1 tablet (15 mg total) by mouth daily. 11/05/18   Nafziger, [name], NP  mirtazapine (REMERON SOL-TAB) 15 MG  disintegrating tablet TAKE 1 TABLET BY MOUTH EVERYDAY AT BEDTIME 05/04/18   Gordy Savers, MD  montelukast (SINGULAIR) 10 MG tablet Take 1 tablet (10 mg total) by mouth at bedtime. 01/13/19   Nafziger, [name], NP  Multiple Vitamin (MULTIVITAMIN) tablet Take 1 tablet by mouth daily. Vitamin supplement. 02/07/12   Viviann Spare, FNP  ondansetron (ZOFRAN) 4 MG tablet Take one tablet every 4-6 hours as needed for nausea 03/18/18   Hilarie Fredrickson, MD  pantoprazole (PROTONIX) 40 MG tablet Take 1 tablet (40 mg total) by mouth 2 (two) times daily. Patient taking differently: Take 40 mg by mouth daily.  08/31/18   Tressia Danas, MD  sertraline (ZOLOFT) 100 MG tablet TAKE 1 TABLET BY MOUTH EVERYDAY AT BEDTIME Patient taking differently: Take 100 mg by mouth at bedtime.  11/03/18   Nafziger, [name], NP  sucralfate (CARAFATE) 1 g tablet Take 1 tablet (1 g total) by mouth 4 (four) times daily. Dissolve tablet in 10 ml warm water Patient taking differently: Take 1 g by mouth as needed. Dissolve tablet in 10 ml warm water 08/31/18   Tressia Danas, MD  Tetrahydrozoline HCl (VISINE OP) Place 1 drop into both eyes daily as needed (dry eyes).    [provider]    Family History Family History  Problem Relation Age of Onset   Kidney disease Mother    Hypertension Mother    Heart disease Father 87       MI   Suicidality Son    Colon cancer Neg Hx    Esophageal cancer Neg Hx    Pancreatic cancer Neg Hx    Rectal cancer Neg Hx    Stomach cancer Neg Hx     Social History Social History   Tobacco Use   Smoking status: Never Smoker   Smokeless tobacco: Never Used  Substance Use Topics   Alcohol use: Never    Frequency: Never   Drug use: Never     Allergies   Morphine and Tape   Review of Systems Review of Systems  Respiratory: Positive for cough and shortness of breath. Negative for sputum production.   Cardiovascular: Negative for chest pain.  All other systems  reviewed and are negative.    Physical Exam Updated Vital Signs BP 137/79 (BP Location: Right Arm)    Pulse 77    Temp 98.1 F (36.7 C) (Oral)  Resp 16    SpO2 98%   Physical Exam Vitals signs and nursing note reviewed.  Constitutional:      Appearance: She is well-developed.  Eyes:     Conjunctiva/sclera: Conjunctivae normal.  Cardiovascular:     Rate and Rhythm: Normal rate and regular rhythm.     Pulses: Normal pulses.  Pulmonary:     Effort: No tachypnea, accessory muscle usage or retractions.     Breath sounds: Decreased air movement present.  Chest:     Chest wall: No tenderness.  Abdominal:     Palpations: Abdomen is soft.  Musculoskeletal: Normal range of motion.     Right lower leg: No edema.     Left lower leg: No edema.  Skin:    General: Skin is warm and dry.  Neurological:     Mental Status: She is alert and oriented to person, place, and time.  Psychiatric:        Mood and Affect: Mood normal.      ED Treatments / Results  Labs (all labs ordered are listed, but only abnormal results are displayed) Labs Reviewed - No data to display  EKG EKG Interpretation  Date/Time:  Tuesday Mar 09 2019 16:42:48 EDT Ventricular Rate:  79 PR Interval:    QRS Duration: 180 QT Interval:  496 QTC Calculation: 573 R Axis:   -27 Text Interpretation:  Sinus rhythm Left bundle branch block Confirmed by Margarita Grizzleay, Danielle [phone]([id number]) on 03/09/2019 6:20:08 PM   Radiology Dg Chest 2 View  Result Date: 03/09/2019 CLINICAL DATA:  Shortness of breath EXAM: CHEST - 2 VIEW COMPARISON:  Chest x-ray dated 01/11/2019 FINDINGS: The cardiac silhouette is mildly enlarged. There is a persistent unchanged small opacity in the peripheral right upper lung zone. There is no pneumothorax. No large pleural effusion. No displaced fracture. IMPRESSION: No active cardiopulmonary disease. Electronically Signed   By: Katherine Mantlehristopher  Green M.D.   On: 03/09/2019 18:48    Procedures Procedures (including  critical care time)  Medications Ordered in ED Medications  potassium chloride 10 mEq in 100 mL IVPB (0 mEq Intravenous Stopped 03/09/19 2141)  potassium chloride SA (K-DUR) CR tablet 40 mEq (40 mEq Oral Given 03/09/19 2141)  potassium chloride 10 mEq in 100 mL IVPB (0 mEq Intravenous Stopped 03/09/19 2314)     Initial Impression / Assessment and Plan / ED Course  I have reviewed the triage vital signs and the nursing notes.  Pertinent labs & imaging results that were available during my care of the patient were reviewed by me and considered in my medical decision making (see chart for details).        Patient discussed with and seen by Dr. Rosalia Hammersay.  Patient with recurrent shortness of breath, gradually worsening over the last several days with onset of fever. COVID testing completed today, is negative. CXR without pneumonia. Found to be hypokalemic, supplementation initiated in the ED. Patient ambulated in ED without increase in dyspnea, no hypoxia. Patient to follow-up with PCP/pulmonlogist. Patient appears safe for discharge at this time. Return precautions discussed.  Final Clinical Impressions(s) / ED Diagnoses   Final diagnoses:  Shortness of breath  Hypokalemia    ED Discharge Orders    None       Felicie MornSmith, Stafford [place], NP 03/10/19 0011    Margarita Grizzleay, Danielle, MD 03/12/19 1021

## 2019-03-09 NOTE — Telephone Encounter (Signed)
I do think she needs to go to the ER for testing as she had a fever few weeks ago with abnormal CT chest . Worry that this could be COVID-19 and be getting worse .  Since she has persistent fever , and dyspnea is worsening does need to be checked. Please tell her to wear mask to ER. Would want her to go to [facility]  ER. Please alert them first thing when she gets there that she needs COVID rule out . (fever, body aches and dyspnea)   Please contact office for sooner follow up if symptoms do not improve or worsen or seek emergency care

## 2019-03-09 NOTE — ED Notes (Signed)
Bed: WA03 Expected date:  Expected time:  Means of arrival:  Comments: HEPA until 1610

## 2019-03-09 NOTE — Patient Instructions (Addendum)
If you are age 81 or older, your body mass index should be between 23-30. Your Body mass index is 27.1 kg/m. If this is out of the aforementioned range listed, please consider follow up with your Primary Care Provider.  If you are age 2 or younger, your body mass index should be between 19-25. Your Body mass index is 27.1 kg/m. If this is out of the aformentioned range listed, please consider follow up with your Primary Care Provider.   1.  I instructed to call her pulmonary specialist or PCP ASAP today for evaluation today.    2.  Smaller portion softer meals more frequently as she has done previously  3.  Continue twice daily pantoprazole and as needed antacids  4.  We will plan repeat upper endoscopy with Botox injection and esophageal dilation after her pulmonary symptoms have completely resolved and she is an appropriate candidate for outpatient endoscopy based on current coronavirus pandemic restrictions.  Call me after you have recovered from her pulmonary illness at which time we will make arrangements for her procedure.  Thank you for choosing me and [facility].   Yancey Flemings, MD

## 2019-03-09 NOTE — Telephone Encounter (Signed)
Primary Pulmonologist: BQ Last office visit and with whom: TP on 02-23-2019 What do we see them for (pulmonary problems): Solitary Pulmonary Nodule Last OV assessment/plan:  Versions: 1. Julie Rice, Julie Bouquet, NP (Nurse Practitioner) at 02/23/2019 10:31 AM - Signed    Add Pepcid 20mg  At bedtime   GERD diet . No eating 3 hrs before bedtime .  Delsym 2 tsp Twice daily  As needed  Cough .  Tessalon Three times a day  As needed  Cough .  Albuterol Inhaler 1-2 puffs every 6hr as needed for wheezing/shortness of breath  Follow up with Rheumatology as planned  Follow up in 2 weeks with televisit with [name] NP .  Follow up in 3 months with Dr. Sherene Sires  With PFTs and As needed   HRCT chest in 3 months prior to visit with Dr. Sherene Sires   Please contact office for sooner follow up if symptoms do not improve or worsen or seek emergency care      Was appointment offered to patient (explain)?  Yes, offered but pt denied.  Reason for call: Pt states that since Sunday morning she has had a fever 100.9, fatigued, sore throat, wheezing, SOB,d ry cough, body aches and chest tightness. Pt states she is having hard time with breathing good, and her SOB has worsen. Pt had televisit this morning with PCP; he advised for her to call our office for evaluation of pt's symptoms and increase SOB. Pt took her temp while on the phone with me, it was 99.9 with having Tylenol one hour ago. Pt has been taking OTC Tylenol 500mg  for fever since Sunday, three days ago. Pt is using her rescue inhaler albuterol 2 puffs BID, singulair 10mg , protonix 40mg  daily. Pt advised that she has not been out of her home very much, her family has visited but no one to her knowledge has been sick. Pt states she has not traveled in months, and has no chills. She use to see MW, but has recently switched to BQ and last seen TP in April 2020. Pt would like to know if she needs to go to hospital to be tested for COVID-19 or need another round of abx.  TP please  advise. Thank you.

## 2019-03-09 NOTE — ED Notes (Signed)
Pt able to stand up and ambulate around room without any difficulty.

## 2019-03-09 NOTE — Progress Notes (Signed)
HISTORY OF PRESENT ILLNESS:  Julie Rice is a 83 y.o. female with GERD, history of esophageal stricture, large symptomatic hiatal hernia status post fundoplication, post fundoplication dysphasia secondary to dysmotility (primary versus secondary with technically limited esophageal manometry).  She underwent upper endoscopy with Botox injection and balloon dilation of the esophagus to 20 mm September 21, 2018.  I last saw her in the office December 11, 2018 for follow-up.  At that time she was doing extremely well from a GI perspective.  Patient tells me that in late March she developed problems with cough, shortness of breath, and fever.  Went to the hospital and had a CT scan on April 2.  Nonspecific findings for which follow-up CT in 3 to 6 months was recommended.  Increased fluid in the esophagus consistent with known esophageal dysmotility noted.  Patient tells me that in more recent weeks she has had worsening problems with her swallowing.  Occasionally gets full during the meal.  Occasionally will need to regurgitate or vomit.  One episode of nocturnal regurgitation.  For breakthrough pyrosis she has used antacids.  10 days ago she had transient loose stools.  2 days ago she had fever greater than 100.  She continues with cough and shortness of breath.  She feels weak.  She has a pulmonary follow-up appointment later this week.  She has been in touch with her primary provider.  She has had family members in and out of her home.  REVIEW OF SYSTEMS:  All non-GI ROS negative unless otherwise stated in the HPI except for anxiety  Past Medical History:  Diagnosis Date  . Anemia   . Anxiety   . Bipolar disorder (HCC)   . Blood transfusion 1991   autologous pts own blood given   . CAD (coronary artery disease)   . Cataract   . Chest pain    "@ rest, lying down, w/exertion"  . Chronic back pain    "mostly lower back but I do have upper back pain regularly" (04/06/2018)  . COPD (chronic  obstructive pulmonary disease) (HCC)   . Depression   . Esophageal stricture   . Fecal occult blood test positive   . Fibromyalgia   . GERD (gastroesophageal reflux disease)   . Heart murmur    "slight" (04/06/2018)  . Hiatal hernia   . Hyperlipidemia    Patient denies  . Hypertension   . Internal hemorrhoids   . Lumbago   . Mitral regurgitation   . Osteoarthritis   . Osteoporosis   . Pneumonia ~ 01/2018  . PONV (postoperative nausea and vomiting)    severe ponv, "in the past" (04/06/2018)  . Rectal bleeding   . Rheumatoid arthritis (HCC)   . Spondylosis   . TIA (transient ischemic attack) 2013  . Urinary incontinence    wears depends    Past Surgical History:  Procedure Laterality Date  . ABDOMINAL HYSTERECTOMY  1982  . BACK SURGERY    . BALLOON DILATION N/A 09/21/2018   Procedure: BALLOON DILATION;  Surgeon: Hilarie Fredrickson, MD;  Location: [facility];  Service: Endoscopy;  Laterality: N/A;  . BLADDER SUSPENSION  1980's  . BOTOX INJECTION N/A 09/21/2018   Procedure: BOTOX INJECTION;  Surgeon: Hilarie Fredrickson, MD;  Location: [facility];  Service: Endoscopy;  Laterality: N/A;  . CATARACT EXTRACTION W/ INTRAOCULAR LENS  IMPLANT, BILATERAL Bilateral 2010  . DILATION AND CURETTAGE OF UTERUS  1961  . ESOPHAGEAL MANOMETRY N/A 03/25/2018   Procedure: ESOPHAGEAL MANOMETRY (  EM);  Surgeon: Napoleon Form, MD;  Location[facility];  Service: Endoscopy;  Laterality: N/A;  . ESOPHAGOGASTRODUODENOSCOPY (EGD) WITH PROPOFOL N/A 04/07/2018   Procedure: ESOPHAGOGASTRODUODENOSCOPY (EGD) WITH PROPOFOL;  Surgeon: Sherrilyn Rist, MD;  Location: [facility] ENDOSCOPY;  Service: Gastroenterology;  Laterality: N/A;  . ESOPHAGOGASTRODUODENOSCOPY (EGD) WITH PROPOFOL N/A 09/21/2018   Procedure: ESOPHAGOGASTRODUODENOSCOPY (EGD) WITH PROPOFOL;  Surgeon: Hilarie Fredrickson, MD;  [facility];  Service: Endoscopy;  Laterality: N/A;  . HERNIA REPAIR    . HIATAL HERNIA REPAIR N/A 08/02/2016   Procedure:  LAPAROSCOPIC REPAIR OF LARGE  HIATAL HERNIA;  Surgeon: Glenna Fellows, MD;  Location: [facility];  Service: General;  Laterality: N/A;  . JOINT REPLACEMENT    . LAPAROSCOPIC NISSEN FUNDOPLICATION N/A 08/02/2016   Procedure: LAPAROSCOPIC NISSEN FUNDOPLICATION;  Surgeon: Glenna Fellows, MD;  Location: [facility];  Service: General;  Laterality: N/A;  . LUMBAR LAMINECTOMY  1990; 1994; [id number];3646   "I've got 2 stainless steel rods; 6 screws; 2 ray cages"took bone from right hip to put in back  . TONSILLECTOMY AND ADENOIDECTOMY  1945  . TOTAL KNEE ARTHROPLASTY Right ~ 1996  . TUBAL LIGATION  ~ 1976    Social History Julie Rice  reports that she has never smoked. She has never used smokeless tobacco. She reports that she does not drink alcohol or use drugs.  family history includes Heart disease (age of onset: 75) in her father; Hypertension in her mother; Kidney disease in her mother; Suicidality in her son.  Allergies  Allergen Reactions  . Morphine Other (See Comments)    Flushing, rash, itching  . Tape Other (See Comments)    Band aides, adhesive tape Redness and pulls skin off       PHYSICAL EXAMINATION: No formal physical examination with telehealth visit  ASSESSMENT:  1.  Her principal problem appears to be fever, cough, shortness of breath.  I cannot tie this directly to her chronic esophageal problems.  She has been struggling with her respiratory complaints for over a month.  Currently this is the priority.  No GI medical or endoscopic intervention at this time will impact this issue (as a question of aspiration was raised) 2.  GERD with prior fundoplication and dysmotility.  EGD with Botox injection and esophageal dilation last November worked well and she was without symptoms February 2020.  Now with recurrent symptoms in recent weeks consistent with known limited durability of Botox therapy  PLAN:  1.  I instructed her to call her pulmonary specialist or PCP ASAP today  for evaluation today.  She agreed 2.  Smaller portion softer meals more frequently as she has done previously 3.  Continue twice daily pantoprazole and as needed antacids 4.  We will plan repeat upper endoscopy with Botox injection and esophageal dilation after her pulmonary symptoms have completely resolved and she is an appropriate candidate for outpatient endoscopy based on current coronavirus pandemic restrictions.  I have asked her to call me after she has recovered from her pulmonary illness at which time we will make arrangements for her procedure. This telemedicine visit was initiated by the patient and consented for by the patient.  She was in her home and I was in my office during the encounter which took approximately 25 minutes.  She understands her may be an associated professional charge for this service

## 2019-03-12 NOTE — Progress Notes (Deleted)
Office Visit Note  Patient: Julie Rice             Date of Birth: Mar 05, 1936           MRN: [id number]             PCP: Shirline Frees, NP Referring: Shirline Frees, NP Visit Date: 03/15/2019 Occupation: @GUAROCC @  Subjective:  No chief complaint on file.   History of Present Illness: Julie Rice is a 83 y.o. female ***   Activities of Daily Living:  Patient reports morning stiffness for *** {minute/hour:19697}.   Patient {ACTIONS;DENIES/REPORTS:21021675::"Denies"} nocturnal pain.  Difficulty dressing/grooming: {ACTIONS;DENIES/REPORTS:21021675::"Denies"} Difficulty climbing stairs: {ACTIONS;DENIES/REPORTS:21021675::"Denies"} Difficulty getting out of chair: {ACTIONS;DENIES/REPORTS:21021675::"Denies"} Difficulty using hands for taps, buttons, cutlery, and/or writing: {ACTIONS;DENIES/REPORTS:21021675::"Denies"}  No Rheumatology ROS completed.   PMFS History:  Patient Active Problem List   Diagnosis Date Noted  . Abnormal CT of the chest 02/23/2019  . Esophageal motility disorder   . Esophageal dysmotility   . Loss of weight   . Moderate protein-calorie malnutrition (HCC)   . Hematemesis 04/06/2018  . Periesophageal hiatal hernia 08/02/2016  . Dysphagia 05/10/2016  . Esophageal stricture 05/10/2016  . Epigastric pain 05/10/2016  . Generalized anxiety disorder 02/06/2012  . Major depressive disorder, recurrent (HCC) 02/05/2012  . Hypokalemia 11/09/2011  . Nausea and vomiting in adult 11/08/2011  . Dehydration 11/08/2011  . Tremors of nervous system 11/08/2011  . Hypothyroidism 05/10/2008  . Dyslipidemia 05/10/2008  . UNSPECIFIED ANEMIA 05/10/2008  . CAROTID ARTERY DISEASE 05/10/2008  . Transient cerebral ischemia 05/10/2008  . Chronic back pain 05/10/2008  . OSTEOPENIA 05/10/2008  . Dyspnea 05/10/2008  . ESOPHAGEAL STRICTURE 01/19/2008  . Essential hypertension 04/29/2007  . GERD 04/29/2007  . Osteoarthritis 04/29/2007  . SPONDYLOSIS 04/29/2007     Past Medical History:  Diagnosis Date  . Anemia   . Anxiety   . Bipolar disorder (HCC)   . Blood transfusion 1991   autologous pts own blood given   . CAD (coronary artery disease)   . Cataract   . Chest pain    "@ rest, lying down, w/exertion"  . Chronic back pain    "mostly lower back but I do have upper back pain regularly" (04/06/2018)  . COPD (chronic obstructive pulmonary disease) (HCC)   . Depression   . Esophageal stricture   . Fecal occult blood test positive   . Fibromyalgia   . GERD (gastroesophageal reflux disease)   . Heart murmur    "slight" (04/06/2018)  . Hiatal hernia   . Hyperlipidemia    Patient denies  . Hypertension   . Internal hemorrhoids   . Lumbago   . Mitral regurgitation   . Osteoarthritis   . Osteoporosis   . Pneumonia ~ 01/2018  . PONV (postoperative nausea and vomiting)    severe ponv, "in the past" (04/06/2018)  . Rectal bleeding   . Rheumatoid arthritis (HCC)   . Spondylosis   . TIA (transient ischemic attack) 2013  . Urinary incontinence    wears depends    Family History  Problem Relation Age of Onset  . Kidney disease Mother   . Hypertension Mother   . Heart disease Father 44       MI  . Suicidality Son   . Colon cancer Neg Hx   . Esophageal cancer Neg Hx   . Pancreatic cancer Neg Hx   . Rectal cancer Neg Hx   . Stomach cancer Neg Hx    Past Surgical History:  Procedure Laterality Date  . ABDOMINAL HYSTERECTOMY  1982  . BACK SURGERY    . BALLOON DILATION N/A 09/21/2018   Procedure: BALLOON DILATION;  Surgeon: Hilarie Fredrickson, MD;  Location: [facility];  Service: Endoscopy;  Laterality: N/A;  . BLADDER SUSPENSION  1980's  . BOTOX INJECTION N/A 09/21/2018   Procedure: BOTOX INJECTION;  Surgeon: Hilarie Fredrickson, MD;  Location: [facility];  Service: Endoscopy;  Laterality: N/A;  . CATARACT EXTRACTION W/ INTRAOCULAR LENS  IMPLANT, BILATERAL Bilateral 2010  . DILATION AND CURETTAGE OF UTERUS  1961  . ESOPHAGEAL MANOMETRY N/A  03/25/2018   Procedure: ESOPHAGEAL MANOMETRY (EM);  Surgeon: Napoleon Form, MD;  Location: [facility];  Service: Endoscopy;  Laterality: N/A;  . ESOPHAGOGASTRODUODENOSCOPY (EGD) WITH PROPOFOL N/A 04/07/2018   Procedure: ESOPHAGOGASTRODUODENOSCOPY (EGD) WITH PROPOFOL;  Surgeon: Sherrilyn Rist, MD;  Location: [facility];  Service: Gastroenterology;  Laterality: N/A;  . ESOPHAGOGASTRODUODENOSCOPY (EGD) WITH PROPOFOL N/A 09/21/2018   Procedure: ESOPHAGOGASTRODUODENOSCOPY (EGD) WITH PROPOFOL;  Surgeon: Hilarie Fredrickson, MD;  Location: [facility];  Service: Endoscopy;  Laterality: N/A;  . HERNIA REPAIR    . HIATAL HERNIA REPAIR N/A 08/02/2016   Procedure: LAPAROSCOPIC REPAIR OF LARGE  HIATAL HERNIA;  Surgeon: Glenna Fellows, MD;  Location: [facility];  Service: General;  Laterality: N/A;  . JOINT REPLACEMENT    . LAPAROSCOPIC NISSEN FUNDOPLICATION N/A 08/02/2016   Procedure: LAPAROSCOPIC NISSEN FUNDOPLICATION;  Surgeon: Glenna Fellows, MD;  Location: [facility];  Service: General;  Laterality: N/A;  . LUMBAR LAMINECTOMY  1990; 1994; [id number]   "I've got 2 stainless steel rods; 6 screws; 2 ray cages"took bone from right hip to put in back  . TONSILLECTOMY AND ADENOIDECTOMY  1945  . TOTAL KNEE ARTHROPLASTY Right ~ 1996  . TUBAL LIGATION  ~ 36   Social History   Social History Narrative   Retired    Camera operator History  Administered Date(s) Administered  . Influenza Whole 09/04/2007, 08/16/2008  . Influenza,inj,Quad PF,6+ Mos 08/17/2013, 09/02/2014  . Pneumococcal Conjugate-13 09/02/2014  . Pneumococcal Polysaccharide-23 06/01/2013  . Td 04/08/2006  . Zoster 04/08/2006     Objective: Vital Signs: There were no vitals taken for this visit.   Physical Exam   Musculoskeletal Exam: ***  CDAI Exam: CDAI Score: Not documented Patient Global Assessment: Not documented; Provider Global Assessment: Not documented Swollen: Not documented; Tender: Not documented Joint  Exam   Not documented   There is currently no information documented on the homunculus. Go to the Rheumatology activity and complete the homunculus joint exam.  Investigation: No additional findings.  Imaging: Dg Chest 2 View  Result Date: 03/09/2019 CLINICAL DATA:  Shortness of breath EXAM: CHEST - 2 VIEW COMPARISON:  Chest x-ray dated 01/11/2019 FINDINGS: The cardiac silhouette is mildly enlarged. There is a persistent unchanged small opacity in the peripheral right upper lung zone. There is no pneumothorax. No large pleural effusion. No displaced fracture. IMPRESSION: No active cardiopulmonary disease. Electronically Signed   By: Katherine Mantle M.D.   On: 03/09/2019 18:48    Recent Labs: Lab Results  Component Value Date   WBC 9.5 03/09/2019   HGB 10.3 (L) 03/09/2019   PLT 271 03/09/2019   NA 137 03/09/2019   K 2.9 (L) 03/09/2019   CL 100 03/09/2019   CO2 29 03/09/2019   GLUCOSE 111 (H) 03/09/2019   BUN 23 03/09/2019   CREATININE 1.23 (H) 03/09/2019   BILITOT 0.6 03/09/2019   ALKPHOS 65 03/09/2019  AST 18 03/09/2019   ALT 14 03/09/2019   PROT 7.1 03/09/2019   ALBUMIN 4.0 03/09/2019   CALCIUM 8.7 (L) 03/09/2019   GFRAA 48 (L) 03/09/2019    Speciality Comments: No specialty comments available.  Procedures:  No procedures performed Allergies: Morphine and Tape   Assessment / Plan:     Visit Diagnoses: Primary osteoarthritis involving multiple joints  Essential hypertension  Hemispheric carotid artery syndrome  Esophageal dysmotility  Esophageal stricture  History of hypothyroidism  History of gastroesophageal reflux (GERD)  Dyslipidemia  Anxiety and depression  Periesophageal hiatal hernia   Orders: No orders of the defined types were placed in this encounter.  No orders of the defined types were placed in this encounter.   Face-to-face time spent with patient was *** minutes. Greater than 50% of time was spent in counseling and coordination  of care.  Follow-Up Instructions: No follow-ups on file.   Gearldine Bienenstockaylor M Ziyad Dyar, PA-C  Note - This record has been created using Dragon software.  Chart creation errors have been sought, but may not always  have been located. Such creation errors do not reflect on  the standard of medical care.

## 2019-03-15 ENCOUNTER — Ambulatory Visit: Payer: Medicare Other | Admitting: Rheumatology

## 2019-03-16 ENCOUNTER — Ambulatory Visit (INDEPENDENT_AMBULATORY_CARE_PROVIDER_SITE_OTHER): Payer: Medicare Other | Admitting: Adult Health

## 2019-03-16 ENCOUNTER — Encounter: Payer: Self-pay | Admitting: Rheumatology

## 2019-03-16 ENCOUNTER — Encounter: Payer: Self-pay | Admitting: Adult Health

## 2019-03-16 ENCOUNTER — Other Ambulatory Visit: Payer: Self-pay

## 2019-03-16 DIAGNOSIS — R9389 Abnormal findings on diagnostic imaging of other specified body structures: Secondary | ICD-10-CM | POA: Diagnosis not present

## 2019-03-16 DIAGNOSIS — K219 Gastro-esophageal reflux disease without esophagitis: Secondary | ICD-10-CM

## 2019-03-16 DIAGNOSIS — K224 Dyskinesia of esophagus: Secondary | ICD-10-CM | POA: Diagnosis not present

## 2019-03-16 DIAGNOSIS — J690 Pneumonitis due to inhalation of food and vomit: Secondary | ICD-10-CM | POA: Diagnosis not present

## 2019-03-16 MED ORDER — AMOXICILLIN-POT CLAVULANATE 875-125 MG PO TABS: 1.0000 | ORAL_TABLET | Freq: Two times a day (BID) | ORAL | 0 refills | Status: AC

## 2019-03-16 NOTE — Patient Instructions (Addendum)
Augmentin 875mg  Twice daily  For 7 days , take with food and yogurt.  Aspiration preacutions as discussed.  Protonix 40mg  Twice daily  Before meal .  Pepcid 20mg  At bedtime   GERD diet . No eating 3 hrs before bedtime .  Delsym 2 tsp Twice daily  As needed  Cough .  Tessalon Three times a day  As needed  Cough .  Albuterol Inhaler 1-2 puffs every 6hr as needed for wheezing/shortness of breath  Follow up in 6 weeks with Dr. Sherene Sires  With PFTs and As needed  -You will need COVID 19 test  prior to this, we will be in touch regarding this process and how to home isolate during this time frame.  Chest xray on return in 6 weeks.  HRCT chest as planned in July  Follow up with Rheumatology as planned.  Please contact office for sooner follow up if symptoms do not improve or worsen or seek emergency care

## 2019-03-16 NOTE — Telephone Encounter (Signed)
Attempted to contact the patient and left message for patient to call the office.  

## 2019-03-16 NOTE — Progress Notes (Signed)
Virtual Visit via Telephone Note  I connected with Julie Rice on 03/16/19 at 10:30 AM EDT by telephone and verified that I am speaking with the correct person using two identifiers.  Location: Patient: Home Provider: Office   I discussed the limitations, risks, security and privacy concerns of performing an evaluation and management service by telephone and the availability of in person appointments. I also discussed with the patient that there may be a patient responsible charge related to this service. The patient expressed understanding and agreed to proceed.   History of Present Illness: 83 year old female never smoker seen for initial pulmonary consult December 31, 2017 for cough and shortness of breath for several years.  She has a history of recurrent pneumonia. Medical history significant for esophageal dysmotility, GERD, large symptomatic hiatal hernia status post Nissen fundoplication in September 2017.  Esophageal stricture status post dilatation April 2018, upper endoscopy with Botox injection and balloon dilatation of the esophagus in November 2019. Patient carries a diagnosis of rheumatoid arthritis however has never seen a rheumatologist and is only on nonsteroidals.  Today's tele-visit as a 3-week follow-up from recent acute bronchitis and ER visit .  Complains of progressive dyspnea with exertion. Going on for >2 year. Over last 4-6 weeks , getting worse. Went to ER on 4/2, dx with acute bronchitis . Treated with ZPack. CT chest was neg for PE. Showed diffuse groundglass nodules and mild tree in bud nodularity.  Was also having increased GERD . Patient has an extensive history as above with reflux and esophageal dysmotility.  She did receive a Botox injection and esophageal dilatation in September 2019. Says was feeling some better until 1 week ago.  She recently developed fever 100.9 , cough and body aches on 5/5, she was referred to ER .  COVID 19 was neg.  WBC was normal  . LA was normal Labs showed low potassium , she was given potassium replacement. CXR showed stable small opacity in RUL . She was not started on any antibiotics.  No further fevers since ER . Does still have ongoing cough that is minimally productive . Dyspnea with minimal activity . No chest pain , orthopnea edema or vomiting .  Previous Chest xray in March 2020 was clear.   Previous CT chest in February 2019 she had a ncecrotic mass vs consolidation with necrosis along the right upper lobe measuring max 6.5cm. Serial Chest xray showed resoultion with residual scarring .   Patient says she carries a diagnosis of rheumatoid arthritis however she is only unmotivated and has not seen a rheumatologist.  She says she does have an upcoming rheumatology appointment but this has been delayed due to the COVID precautions. She has an appointment on 04/06/19.     Observations/Objective: CT chest February 04, 2019- for PE, diffuse groundglass nodules and mild tree-in-bud nodularity, increased diffuse distention of the thoracic esophagus containing a large amount of fluid and air   Assessment and Plan: Recent slow to resolve bronchitis versus possible aspiration pneumonia with right upper lobe small opacity on chest x-ray..  Also recent CT chest last month showed diffuse groundglass nodules and mild tree-in-bud nodularity.  Patient has severe esophageal dysmotility.  She is prone for aspiration pneumonia.  She continues to have lingering symptoms of cough and shortness of breath.  Was COVID-19 restrictions are lifted.  We will need to set her up for a PFT to evaluate her underlying lung function.  For now we will treat with Augmentin x7 days  for possible underlying pneumonia.  Continue with strict aspiration precautions.  GERD medications. We will perform a chest x-ray on return to the office in 6 weeks   Abnormal CT chest.  Patient will follow-up for a high-resolution CT chest in July.  Possible rheumatoid  arthritis.  Patient will follow-up with MD as planned next month  Plan  Patient Instructions  Augmentin 875mg  Twice daily  For 7 days , take with food and yogurt.  Aspiration preacutions as discussed.  Protonix 40mg  Twice daily  Before meal .  Pepcid 20mg  At bedtime   GERD diet . No eating 3 hrs before bedtime .  Delsym 2 tsp Twice daily  As needed  Cough .  Tessalon Three times a day  As needed  Cough .  Albuterol Inhaler 1-2 puffs every 6hr as needed for wheezing/shortness of breath  Follow up in 6 weeks with Dr. Sherene Sires  With PFTs and As needed  -You will need COVID 19 test  prior to this, we will be in touch regarding this process and how to home isolate during this time frame.  Chest xray on return in 6 weeks.  HRCT chest as planned in July  Follow up with Rheumatology as planned.  Please contact office for sooner follow up if symptoms do not improve or worsen or seek emergency care        Follow Up Instructions: Follow-up with Dr. Sherene Sires in 6 weeks and as needed Please contact office for sooner follow up if symptoms do not improve or worsen or seek emergency care     I discussed the assessment and treatment plan with the patient. The patient was provided an opportunity to ask questions and all were answered. The patient agreed with the plan and demonstrated an understanding of the instructions.   The patient was advised to call back or seek an in-person evaluation if the symptoms worsen or if the condition fails to improve as anticipated.  I provided 32  minutes of non-face-to-face time during this encounter.   Rubye Oaks, NP

## 2019-03-23 NOTE — Progress Notes (Deleted)
Office Visit Note  Patient: Julie Rice             Date of Birth: 1936-05-03           MRN: [id number]             PCP: Shirline Frees, NP Referring: Shirline Frees, NP Visit Date: 04/06/2019 Occupation: @GUAROCC @  Subjective:  No chief complaint on file.   History of Present Illness: Julie Rice is a 83 y.o. female ***   Activities of Daily Living:  Patient reports morning stiffness for *** {minute/hour:19697}.   Patient {ACTIONS;DENIES/REPORTS:21021675::"Denies"} nocturnal pain.  Difficulty dressing/grooming: {ACTIONS;DENIES/REPORTS:21021675::"Denies"} Difficulty climbing stairs: {ACTIONS;DENIES/REPORTS:21021675::"Denies"} Difficulty getting out of chair: {ACTIONS;DENIES/REPORTS:21021675::"Denies"} Difficulty using hands for taps, buttons, cutlery, and/or writing: {ACTIONS;DENIES/REPORTS:21021675::"Denies"}  No Rheumatology ROS completed.   PMFS History:  Patient Active Problem List   Diagnosis Date Noted  . Abnormal CT of the chest 02/23/2019  . Esophageal motility disorder   . Esophageal dysmotility   . Loss of weight   . Moderate protein-calorie malnutrition (HCC)   . Hematemesis 04/06/2018  . Periesophageal hiatal hernia 08/02/2016  . Dysphagia 05/10/2016  . Esophageal stricture 05/10/2016  . Epigastric pain 05/10/2016  . Generalized anxiety disorder 02/06/2012  . Major depressive disorder, recurrent (HCC) 02/05/2012  . Hypokalemia 11/09/2011  . Nausea and vomiting in adult 11/08/2011  . Dehydration 11/08/2011  . Tremors of nervous system 11/08/2011  . Hypothyroidism 05/10/2008  . Dyslipidemia 05/10/2008  . UNSPECIFIED ANEMIA 05/10/2008  . CAROTID ARTERY DISEASE 05/10/2008  . Transient cerebral ischemia 05/10/2008  . Chronic back pain 05/10/2008  . OSTEOPENIA 05/10/2008  . Dyspnea 05/10/2008  . ESOPHAGEAL STRICTURE 01/19/2008  . Essential hypertension 04/29/2007  . GERD 04/29/2007  . Osteoarthritis 04/29/2007  . SPONDYLOSIS 04/29/2007     Past Medical History:  Diagnosis Date  . Anemia   . Anxiety   . Bipolar disorder (HCC)   . Blood transfusion 1991   autologous pts own blood given   . CAD (coronary artery disease)   . Cataract   . Chest pain    "@ rest, lying down, w/exertion"  . Chronic back pain    "mostly lower back but I do have upper back pain regularly" (04/06/2018)  . COPD (chronic obstructive pulmonary disease) (HCC)   . Depression   . Esophageal stricture   . Fecal occult blood test positive   . Fibromyalgia   . GERD (gastroesophageal reflux disease)   . Heart murmur    "slight" (04/06/2018)  . Hiatal hernia   . Hyperlipidemia    Patient denies  . Hypertension   . Internal hemorrhoids   . Lumbago   . Mitral regurgitation   . Osteoarthritis   . Osteoporosis   . Pneumonia ~ 01/2018  . PONV (postoperative nausea and vomiting)    severe ponv, "in the past" (04/06/2018)  . Rectal bleeding   . Rheumatoid arthritis (HCC)   . Spondylosis   . TIA (transient ischemic attack) 2013  . Urinary incontinence    wears depends    Family History  Problem Relation Age of Onset  . Kidney disease Mother   . Hypertension Mother   . Heart disease Father 71       MI  . Suicidality Son   . Colon cancer Neg Hx   . Esophageal cancer Neg Hx   . Pancreatic cancer Neg Hx   . Rectal cancer Neg Hx   . Stomach cancer Neg Hx    Past Surgical History:  Procedure Laterality Date  . ABDOMINAL HYSTERECTOMY  1982  . BACK SURGERY    . BALLOON DILATION N/A 09/21/2018   Procedure: BALLOON DILATION;  Surgeon: Hilarie Fredrickson, MD;  Location: [facility];  Service: Endoscopy;  Laterality: N/A;  . BLADDER SUSPENSION  1980's  . BOTOX INJECTION N/A 09/21/2018   Procedure: BOTOX INJECTION;  Surgeon: Hilarie Fredrickson, MD;  Location: [facility];  Service: Endoscopy;  Laterality: N/A;  . CATARACT EXTRACTION W/ INTRAOCULAR LENS  IMPLANT, BILATERAL Bilateral 2010  . DILATION AND CURETTAGE OF UTERUS  1961  . ESOPHAGEAL MANOMETRY N/A  03/25/2018   Procedure: ESOPHAGEAL MANOMETRY (EM);  Surgeon: Napoleon Form, MD;  Location: [facility];  Service: Endoscopy;  Laterality: N/A;  . ESOPHAGOGASTRODUODENOSCOPY (EGD) WITH PROPOFOL N/A 04/07/2018   Procedure: ESOPHAGOGASTRODUODENOSCOPY (EGD) WITH PROPOFOL;  Surgeon: Sherrilyn Rist, MD;  Location: [facility];  Service: Gastroenterology;  Laterality: N/A;  . ESOPHAGOGASTRODUODENOSCOPY (EGD) WITH PROPOFOL N/A 09/21/2018   Procedure: ESOPHAGOGASTRODUODENOSCOPY (EGD) WITH PROPOFOL;  Surgeon: Hilarie Fredrickson, MD;  Location: [facility];  Service: Endoscopy;  Laterality: N/A;  . HERNIA REPAIR    . HIATAL HERNIA REPAIR N/A 08/02/2016   Procedure: LAPAROSCOPIC REPAIR OF LARGE  HIATAL HERNIA;  Surgeon: Glenna Fellows, MD;  Location: [facility];  Service: General;  Laterality: N/A;  . JOINT REPLACEMENT    . LAPAROSCOPIC NISSEN FUNDOPLICATION N/A 08/02/2016   Procedure: LAPAROSCOPIC NISSEN FUNDOPLICATION;  Surgeon: Glenna Fellows, MD;  Location: [facility];  Service: General;  Laterality: N/A;  . LUMBAR LAMINECTOMY  1990; 1994; [id number]   "I've got 2 stainless steel rods; 6 screws; 2 ray cages"took bone from right hip to put in back  . TONSILLECTOMY AND ADENOIDECTOMY  1945  . TOTAL KNEE ARTHROPLASTY Right ~ 1996  . TUBAL LIGATION  ~ 68   Social History   Social History Narrative   Retired    Camera operator History  Administered Date(s) Administered  . Influenza Whole 09/04/2007, 08/16/2008  . Influenza, High Dose Seasonal PF 08/04/2018  . Influenza,inj,Quad PF,6+ Mos 08/17/2013, 09/02/2014  . Pneumococcal Conjugate-13 09/02/2014  . Pneumococcal Polysaccharide-23 06/01/2013  . Td 04/08/2006  . Zoster 04/08/2006     Objective: Vital Signs: There were no vitals taken for this visit.   Physical Exam   Musculoskeletal Exam: ***  CDAI Exam: CDAI Score: Not documented Patient Global Assessment: Not documented; Provider Global Assessment: Not documented Swollen:  Not documented; Tender: Not documented Joint Exam   Not documented   There is currently no information documented on the homunculus. Go to the Rheumatology activity and complete the homunculus joint exam.  Investigation: No additional findings.  Imaging: Dg Chest 2 View  Result Date: 03/09/2019 CLINICAL DATA:  Shortness of breath EXAM: CHEST - 2 VIEW COMPARISON:  Chest x-ray dated 01/11/2019 FINDINGS: The cardiac silhouette is mildly enlarged. There is a persistent unchanged small opacity in the peripheral right upper lung zone. There is no pneumothorax. No large pleural effusion. No displaced fracture. IMPRESSION: No active cardiopulmonary disease. Electronically Signed   By: Katherine Mantle M.D.   On: 03/09/2019 18:48    Recent Labs: Lab Results  Component Value Date   WBC 9.5 03/09/2019   HGB 10.3 (L) 03/09/2019   PLT 271 03/09/2019   NA 137 03/09/2019   K 2.9 (L) 03/09/2019   CL 100 03/09/2019   CO2 29 03/09/2019   GLUCOSE 111 (H) 03/09/2019   BUN 23 03/09/2019   CREATININE 1.23 (H) 03/09/2019  BILITOT 0.6 03/09/2019   ALKPHOS 65 03/09/2019   AST 18 03/09/2019   ALT 14 03/09/2019   PROT 7.1 03/09/2019   ALBUMIN 4.0 03/09/2019   CALCIUM 8.7 (L) 03/09/2019   GFRAA 48 (L) 03/09/2019    Speciality Comments: No specialty comments available.  Procedures:  No procedures performed Allergies: Morphine and Tape   Assessment / Plan:     Visit Diagnoses: Primary osteoarthritis involving multiple joints  History of osteopenia  Essential hypertension  Hemispheric carotid artery syndrome  Esophageal dysmotility  Esophageal stricture  History of hypothyroidism  Periesophageal hiatal hernia  Dyslipidemia  Generalized anxiety disorder  History of depression   Orders: No orders of the defined types were placed in this encounter.  No orders of the defined types were placed in this encounter.   Face-to-face time spent with patient was *** minutes. Greater  than 50% of time was spent in counseling and coordination of care.  Follow-Up Instructions: No follow-ups on file.   Gearldine Bienenstockaylor M Leanore Biggers, PA-C  Note - This record has been created using Dragon software.  Chart creation errors have been sought, but may not always  have been located. Such creation errors do not reflect on  the standard of medical care.

## 2019-03-25 ENCOUNTER — Encounter: Payer: Self-pay | Admitting: Adult Health

## 2019-03-26 ENCOUNTER — Other Ambulatory Visit: Payer: Self-pay | Admitting: Adult Health

## 2019-03-26 MED ORDER — POTASSIUM CHLORIDE ER 10 MEQ PO TBCR: 10.0000 meq | EXTENDED_RELEASE_TABLET | Freq: Every day | ORAL | 0 refills | Status: DC

## 2019-03-30 ENCOUNTER — Encounter: Payer: Self-pay | Admitting: Adult Health

## 2019-03-30 ENCOUNTER — Ambulatory Visit: Payer: Medicare Other | Admitting: Adult Health

## 2019-03-30 ENCOUNTER — Other Ambulatory Visit: Payer: Self-pay

## 2019-03-30 VITALS — BP 162/76 | Temp 98.0°F | Wt 150.0 lb

## 2019-03-30 DIAGNOSIS — M79605 Pain in left leg: Secondary | ICD-10-CM

## 2019-03-30 DIAGNOSIS — R0989 Other specified symptoms and signs involving the circulatory and respiratory systems: Secondary | ICD-10-CM

## 2019-03-30 DIAGNOSIS — M79604 Pain in right leg: Secondary | ICD-10-CM | POA: Diagnosis not present

## 2019-03-30 DIAGNOSIS — I10 Essential (primary) hypertension: Secondary | ICD-10-CM

## 2019-03-30 DIAGNOSIS — R0789 Other chest pain: Secondary | ICD-10-CM

## 2019-03-30 LAB — CBC WITH DIFFERENTIAL/PLATELET
Basophils Absolute: 0.1 10*3/uL (ref 0.0–0.1)
Basophils Relative: 1.4 % (ref 0.0–3.0)
Eosinophils Absolute: 0.3 10*3/uL (ref 0.0–0.7)
Eosinophils Relative: 4.8 % (ref 0.0–5.0)
HCT: 32.7 % — ABNORMAL LOW (ref 36.0–46.0)
Hemoglobin: 10.8 g/dL — ABNORMAL LOW (ref 12.0–15.0)
Lymphocytes Relative: 21.4 % (ref 12.0–46.0)
Lymphs Abs: 1.5 10*3/uL (ref 0.7–4.0)
MCHC: 33.2 g/dL (ref 30.0–36.0)
MCV: 84.9 fl (ref 78.0–100.0)
Monocytes Absolute: 0.4 10*3/uL (ref 0.1–1.0)
Monocytes Relative: 5.3 % (ref 3.0–12.0)
Neutro Abs: 4.8 10*3/uL (ref 1.4–7.7)
Neutrophils Relative %: 67.1 % (ref 43.0–77.0)
Platelets: 295 10*3/uL (ref 150.0–400.0)
RBC: 3.84 Mil/uL — ABNORMAL LOW (ref 3.87–5.11)
RDW: 16.2 % — ABNORMAL HIGH (ref 11.5–15.5)
WBC: 7.2 10*3/uL (ref 4.0–10.5)

## 2019-03-30 LAB — BASIC METABOLIC PANEL
BUN: 20 mg/dL (ref 6–23)
CO2: 31 mEq/L (ref 19–32)
Calcium: 9.1 mg/dL (ref 8.4–10.5)
Chloride: 100 mEq/L (ref 96–112)
Creatinine, Ser: 0.94 mg/dL (ref 0.40–1.20)
GFR: 57.12 mL/min — ABNORMAL LOW (ref >60.00)
Glucose, Bld: 94 mg/dL (ref 70–99)
Potassium: 4.2 mEq/L (ref 3.5–5.1)
Sodium: 138 mEq/L (ref 135–145)

## 2019-03-30 NOTE — Progress Notes (Signed)
Subjective:    Patient ID: Julie Rice, female    DOB: 26-Mar-1936, 83 y.o.   MRN: [id number]  HPI 83 year old female who  has a past medical history of Anemia, Anxiety, Bipolar disorder (HCC), Blood transfusion (1991), CAD (coronary artery disease), Cataract, Chest pain, Chronic back pain, COPD (chronic obstructive pulmonary disease) (HCC), Depression, Esophageal stricture, Fecal occult blood test positive, Fibromyalgia, GERD (gastroesophageal reflux disease), Heart murmur, Hiatal hernia, Hyperlipidemia, Hypertension, Internal hemorrhoids, Lumbago, Mitral regurgitation, Osteoarthritis, Osteoporosis, Pneumonia (~ 01/2018), PONV (postoperative nausea and vomiting), Rectal bleeding, Rheumatoid arthritis (HCC), Spondylosis, TIA (transient ischemic attack) (2013), and Urinary incontinence.  She presents to the clinic today for multiple issues.   Hypertension -currently prescribed Hyzaar 100-25 mg as well as Norvasc 2.5 mg.  She has been monitoring her blood pressure at home and reports readings consistently in the 150s to 180s over 80s to 100s.  She is not having any headaches or blurred vision.  She continues to endorse shortness of breath and cough which is being worked up by pulmonary(CT chest in April 2020 showed diffuse groundglass nodules and mild tree-in-bud nodularity) she feels as though some days shortness of breath improves but other days goes back to her recent baseline.  Additionally she denies any chest pain but does intermittently get feeling of "chest tightness" across her chest that radiates into her left  shoulder.  Denies any discomfort down the left arm or up the left jaw  Abnormal Pulse -she has been monitoring her pulse ox at home and reports that she has noticed that her pulse and irregular, she reports last week it dropped to 34 for a short period of time then came back up to 64.  The day after it was 101 bpm.  Her pulse becomes high she will often feel symptomatic with a "flushing  sensation".  Hypokalemia -she was seen in the emergency room on 03/09/2019 at which time it was noted that her potassium level was 2.9.  She reports getting IV potassium supplementation and the ER provider was supposed to send in potassium supplementation for her but this was never done.  She does report severe lower extremity pain that is been happening at night, since she was restarted on potassium supplementation this lower extremity pain has improved.  Shortness of Breath -as mentioned above she is being worked up by pulmonary.  Some days are better than others.  In her recent ER visit earlier this month she was tested for COVID 19 and tested negative.  She is interested in antibody testing, spite knowing that this test is not accurate.  Review of Systems See HPI   Past Medical History:  Diagnosis Date  . Anemia   . Anxiety   . Bipolar disorder (HCC)   . Blood transfusion 1991   autologous pts own blood given   . CAD (coronary artery disease)   . Cataract   . Chest pain    "@ rest, lying down, w/exertion"  . Chronic back pain    "mostly lower back but I do have upper back pain regularly" (04/06/2018)  . COPD (chronic obstructive pulmonary disease) (HCC)   . Depression   . Esophageal stricture   . Fecal occult blood test positive   . Fibromyalgia   . GERD (gastroesophageal reflux disease)   . Heart murmur    "slight" (04/06/2018)  . Hiatal hernia   . Hyperlipidemia    Patient denies  . Hypertension   . Internal hemorrhoids   .  Lumbago   . Mitral regurgitation   . Osteoarthritis   . Osteoporosis   . Pneumonia ~ 01/2018  . PONV (postoperative nausea and vomiting)    severe ponv, "in the past" (04/06/2018)  . Rectal bleeding   . Rheumatoid arthritis (HCC)   . Spondylosis   . TIA (transient ischemic attack) 2013  . Urinary incontinence    wears depends    Social History   Socioeconomic History  . Marital status: Widowed    Spouse name: Not on file  . Number of children: 2   . Years of education: Not on file  . Highest education level: Not on file  Occupational History    Employer: RETIRED  Social Needs  . Financial resource strain: Not on file  . Food insecurity:    Worry: Not on file    Inability: Not on file  . Transportation needs:    Medical: Not on file    Non-medical: Not on file  Tobacco Use  . Smoking status: Never Smoker  . Smokeless tobacco: Never Used  Substance and Sexual Activity  . Alcohol use: Never    Frequency: Never  . Drug use: Never  . Sexual activity: Not Currently  Lifestyle  . Physical activity:    Days per week: Not on file    Minutes per session: Not on file  . Stress: Not on file  Relationships  . Social connections:    Talks on phone: Not on file    Gets together: Not on file    Attends religious service: Not on file    Active member of club or organization: Not on file    Attends meetings of clubs or organizations: Not on file    Relationship status: Not on file  . Intimate partner violence:    Fear of current or ex partner: Not on file    Emotionally abused: Not on file    Physically abused: Not on file    Forced sexual activity: Not on file  Other Topics Concern  . Not on file  Social History Narrative   Retired    Widowed    Past Surgical History:  Procedure Laterality Date  . ABDOMINAL HYSTERECTOMY  1982  . BACK SURGERY    . BALLOON DILATION N/A 09/21/2018   Procedure: BALLOON DILATION;  Surgeon: Hilarie Fredrickson, MD;  Location: [facility];  Service: Endoscopy;  Laterality: N/A;  . BLADDER SUSPENSION  1980's  . BOTOX INJECTION N/A 09/21/2018   Procedure: BOTOX INJECTION;  Surgeon: Hilarie Fredrickson, MD;  Location: [facility];  Service: Endoscopy;  Laterality: N/A;  . CATARACT EXTRACTION W/ INTRAOCULAR LENS  IMPLANT, BILATERAL Bilateral 2010  . DILATION AND CURETTAGE OF UTERUS  1961  . ESOPHAGEAL MANOMETRY N/A 03/25/2018   Procedure: ESOPHAGEAL MANOMETRY (EM);  Surgeon: Napoleon Form, MD;   Location: [facility];  Service: Endoscopy;  Laterality: N/A;  . ESOPHAGOGASTRODUODENOSCOPY (EGD) WITH PROPOFOL N/A 04/07/2018   Procedure: ESOPHAGOGASTRODUODENOSCOPY (EGD) WITH PROPOFOL;  Surgeon: Sherrilyn Rist, MD;  Location: [facility];  Service: Gastroenterology;  Laterality: N/A;  . ESOPHAGOGASTRODUODENOSCOPY (EGD) WITH PROPOFOL N/A 09/21/2018   Procedure: ESOPHAGOGASTRODUODENOSCOPY (EGD) WITH PROPOFOL;  Surgeon: Hilarie Fredrickson, MD;  Location: [facility];  Service: Endoscopy;  Laterality: N/A;  . HERNIA REPAIR    . HIATAL HERNIA REPAIR N/A 08/02/2016   Procedure: LAPAROSCOPIC REPAIR OF LARGE  HIATAL HERNIA;  Surgeon: Glenna Fellows, MD;  Location: [facility];  Service: General;  Laterality: N/A;  . JOINT  REPLACEMENT    . LAPAROSCOPIC NISSEN FUNDOPLICATION N/A 08/02/2016   Procedure: LAPAROSCOPIC NISSEN FUNDOPLICATION;  Surgeon: Glenna FellowsBenjamin Hoxworth, MD;  Location: [facility];  Service: General;  Laterality: N/A;  . LUMBAR LAMINECTOMY  1990; 1994; [id number]; 1997;2000   "I've got 2 stainless steel rods; 6 screws; 2 ray cages"took bone from right hip to put in back  . TONSILLECTOMY AND ADENOIDECTOMY  1945  . TOTAL KNEE ARTHROPLASTY Right ~ 1996  . TUBAL LIGATION  ~ 1976    Family History  Problem Relation Age of Onset  . Kidney disease Mother   . Hypertension Mother   . Heart disease Father [id number]       MI  . Suicidality Son   . Colon cancer Neg Hx   . Esophageal cancer Neg Hx   . Pancreatic cancer Neg Hx   . Rectal cancer Neg Hx   . Stomach cancer Neg Hx     Allergies  Allergen Reactions  . Morphine Other (See Comments)    Flushing, rash, itching  . Tape Other (See Comments)    Band aides, adhesive tape Redness and pulls skin off    Current Outpatient Medications on File Prior to Visit  Medication Sig Dispense Refill  . acetaminophen (TYLENOL) 500 MG tablet Take 1,000 mg by mouth 2 (two) times daily as needed for moderate pain (arthritis).     Marland Kitchen. albuterol (VENTOLIN HFA) 108 (90 Base)  MCG/ACT inhaler Inhale 2 puffs into the lungs every 6 (six) hours as needed for wheezing or shortness of breath. 1 Inhaler 6  . amLODipine (NORVASC) 2.5 MG tablet Take 1 tablet (2.5 mg total) by mouth daily. 90 tablet 3  . aspirin 81 MG tablet Take 1 tablet (81 mg total) by mouth daily. For cardiovascular health.    . benzonatate (TESSALON) 200 MG capsule Take 1 capsule (200 mg total) by mouth 2 (two) times daily as needed for cough. 20 capsule 1  . clonazePAM (KLONOPIN) 0.5 MG tablet Take 0.5 mg by mouth 3 (three) times daily.     Marland Kitchen. gabapentin (NEURONTIN) 100 MG capsule TAKE 1 CAPSULE BY MOUTH THREE TIMES A DAY (Patient taking differently: Take 100 mg by mouth 3 (three) times daily. TAKE 1 CAPSULE BY MOUTH THREE TIMES A DAY) 270 capsule 1  . losartan-hydrochlorothiazide (HYZAAR) 100-25 MG tablet Take 1 tablet by mouth daily. 90 tablet 3  . meloxicam (MOBIC) 15 MG tablet Take 1 tablet (15 mg total) by mouth daily. 90 tablet 3  . Multiple Vitamin (MULTIVITAMIN) tablet Take 1 tablet by mouth daily. Vitamin supplement.    . ondansetron (ZOFRAN) 4 MG tablet Take one tablet every 4-6 hours as needed for nausea (Patient taking differently: Take 4 mg by mouth every 4 (four) hours as needed for nausea or vomiting. ) 60 tablet 3  . pantoprazole (PROTONIX) 40 MG tablet Take 1 tablet (40 mg total) by mouth 2 (two) times daily. 60 tablet 3  . potassium chloride (K-DUR) 10 MEQ tablet Take 1 tablet (10 mEq total) by mouth daily for 30 days. 30 tablet 0  . sertraline (ZOLOFT) 100 MG tablet TAKE 1 TABLET BY MOUTH EVERYDAY AT BEDTIME (Patient taking differently: Take 100 mg by mouth at bedtime. ) 90 tablet 1  . sucralfate (CARAFATE) 1 g tablet Take 1 tablet (1 g total) by mouth 4 (four) times daily. Dissolve tablet in 10 ml warm water (Patient taking differently: Take 1 g by mouth 4 (four) times daily as needed (heartburn). Dissolve tablet in 10  ml warm water) 56 tablet 1  . tetrahydrozoline 0.05 % ophthalmic solution  Place 1 drop into both eyes daily.     No current facility-administered medications on file prior to visit.     BP (!) 162/76   Temp 98 F (36.7 C)   Wt 150 lb (68 kg)   BMI 26.57 kg/m       Objective:   Physical Exam Vitals signs and nursing note reviewed.  Constitutional:      Appearance: Normal appearance.  Cardiovascular:     Rate and Rhythm: Normal rate and regular rhythm.     Pulses: Normal pulses.     Heart sounds: Normal heart sounds. No murmur. No friction rub. No gallop.   Pulmonary:     Effort: Pulmonary effort is normal. No respiratory distress.     Breath sounds: Normal breath sounds. No stridor. No wheezing, rhonchi or rales.  Chest:     Chest wall: No tenderness.  Abdominal:     General: Abdomen is flat.     Palpations: Abdomen is soft.  Musculoskeletal: Normal range of motion.        General: No swelling, tenderness, deformity or signs of injury.     Right lower leg: No edema.     Left lower leg: No edema.  Skin:    General: Skin is warm and dry.     Capillary Refill: Capillary refill takes less than 2 seconds.  Neurological:     General: No focal deficit present.     Mental Status: She is alert and oriented to person, place, and time.     Cranial Nerves: No cranial nerve deficit.     Sensory: No sensory deficit.     Motor: No weakness.     Coordination: Coordination normal.     Gait: Gait normal.     Deep Tendon Reflexes: Reflexes normal.  Psychiatric:        Mood and Affect: Mood normal.        Behavior: Behavior normal.        Thought Content: Thought content normal.        Judgment: Judgment normal.       Assessment & Plan:   1. Chest tightness  - CBC with Differential/Platelet - Basic Metabolic Panel - EKG 12-Lead- Negative T waves, Rate 61. T waves appear to be new.  - SAR CoV2 Serology (COVID 19)AB(IGG)IA - Holter monitor - 48 hour; Future - Ambulatory referral to Cardiology  2. Pain in both lower extremities - possibly from  hypokalemia? Cannot r/o PAD. Seems to be improving since she started back on potassium supplement.  - Consider referral to Vein and Vascular.  - Ambulatory referral to Cardiology  3. Essential hypertension - Continue with current medication. Advised to continue to monitor BP at home.  - Return precautions reviewed  - CBC with Differential/Platelet - Basic Metabolic Panel - Ambulatory referral to Cardiology  4. Pulse irregularity  - CBC with Differential/Platelet - Basic Metabolic Panel - Holter monitor - 48 hour; Future - Ambulatory referral to Cardiology  Shirline Frees, NP

## 2019-03-30 NOTE — Patient Instructions (Signed)
It was great seeing you today   I am going to do some blood work on you and then will follow up with you as well   Please increase Norvasc from 2.5 mg to 5 mg ( two pills) - send me your blood pressure results via Mychart over the next week   Someone from Cardiology will call you to schedule your holter monitor so we can see why your heart beat is irregular.

## 2019-03-31 ENCOUNTER — Encounter: Payer: Self-pay | Admitting: Adult Health

## 2019-03-31 LAB — SAR COV2 SEROLOGY (COVID19)AB(IGG),IA: SARS CoV2 AB IGG: NEGATIVE

## 2019-04-02 ENCOUNTER — Other Ambulatory Visit: Payer: Self-pay | Admitting: Adult Health

## 2019-04-05 ENCOUNTER — Encounter: Payer: Self-pay | Admitting: Adult Health

## 2019-04-05 NOTE — Progress Notes (Signed)
Chart and office note reviewed in detail  > agree with a/p as outlined    

## 2019-04-06 ENCOUNTER — Telehealth: Payer: Self-pay | Admitting: *Deleted

## 2019-04-06 ENCOUNTER — Ambulatory Visit: Payer: Medicare Other | Admitting: Rheumatology

## 2019-04-06 ENCOUNTER — Ambulatory Visit: Payer: Self-pay | Admitting: Rheumatology

## 2019-04-06 NOTE — Telephone Encounter (Signed)
Sent to the pharmacy by e-scribe. 

## 2019-04-06 NOTE — Telephone Encounter (Signed)
[employer] to mail a 3 day ZIO XT long term holter monitor to her home.  Instructions reviewed briefly as they are included in the monitor kit.

## 2019-04-08 ENCOUNTER — Telehealth: Payer: Self-pay | Admitting: Adult Health

## 2019-04-13 ENCOUNTER — Telehealth: Payer: Self-pay | Admitting: *Deleted

## 2019-04-13 NOTE — Telephone Encounter (Signed)
Left a message for the pt to return call.  Monitor was ordered by cardiology.  Pt will need to reach out to that office.

## 2019-04-13 NOTE — Telephone Encounter (Signed)
Copied from [place] [id number]. Topic: General - Inquiry >> Apr 13, 2019 11:19 AM Scherrie Gerlach wrote: Reason for CRM: pt states she got the heart monitor in the mail Sat and does not think she knows think she knows how to put on right.  Wants to know if ok to come by the office and get someone to help her?

## 2019-04-13 NOTE — Telephone Encounter (Signed)
Pt called and said she would like a call back since Tommi Rumps was the one that ordered this for her and told her 3 steps, she would like to refresh her memory. [phone]

## 2019-04-13 NOTE — Telephone Encounter (Signed)
L/M on pt vm to call back to schedule PFT and f/u w/ TP -pr

## 2019-04-14 ENCOUNTER — Telehealth: Payer: Self-pay | Admitting: Internal Medicine

## 2019-04-14 ENCOUNTER — Ambulatory Visit (INDEPENDENT_AMBULATORY_CARE_PROVIDER_SITE_OTHER): Payer: Medicare Other

## 2019-04-14 ENCOUNTER — Other Ambulatory Visit: Payer: Self-pay | Admitting: Internal Medicine

## 2019-04-14 DIAGNOSIS — R0789 Other chest pain: Secondary | ICD-10-CM

## 2019-04-14 DIAGNOSIS — R0989 Other specified symptoms and signs involving the circulatory and respiratory systems: Secondary | ICD-10-CM

## 2019-04-14 DIAGNOSIS — R9431 Abnormal electrocardiogram [ECG] [EKG]: Secondary | ICD-10-CM | POA: Diagnosis not present

## 2019-04-14 NOTE — Telephone Encounter (Signed)
Called and spoke to the pt.  Explained that holter was ordered by cardiology and that we do not attach those here in the office.  Gave her the telephone number to [facility].  She will call for instruction.  Nothing further needed.

## 2019-04-14 NOTE — Telephone Encounter (Signed)
New Message           Patient is needing help with her monitor.

## 2019-04-14 NOTE — Telephone Encounter (Signed)
Patient apprehensive about applying ZIO XT patch monitor.  Explained application step by step.  Patient more at ease about applying herself.  She will call if further assistance needed.   Confirmed with patient referral from [name], NP to see [name] in system.  She will be called to schedule or can call office to schedule.

## 2019-04-14 NOTE — Telephone Encounter (Signed)
Patient called back - scheduled PFT and OV on 04/22/2019-pr

## 2019-04-15 ENCOUNTER — Other Ambulatory Visit: Payer: Self-pay | Admitting: Adult Health

## 2019-04-16 NOTE — Telephone Encounter (Signed)
Ok to refill for 90 days. Lets get her in for a CPE towards the end of the summer

## 2019-04-17 ENCOUNTER — Other Ambulatory Visit: Payer: Self-pay | Admitting: Adult Health

## 2019-04-19 ENCOUNTER — Other Ambulatory Visit (HOSPITAL_COMMUNITY)
Admission: RE | Admit: 2019-04-19 | Discharge: 2019-04-19 | Disposition: A | Discharge status: Home / Self Care | Payer: Medicare Other | Source: Ambulatory Visit | Attending: Internal Medicine | Admitting: Internal Medicine

## 2019-04-19 DIAGNOSIS — Z1159 Encounter for screening for other viral diseases: Secondary | ICD-10-CM | POA: Diagnosis not present

## 2019-04-20 ENCOUNTER — Telehealth: Payer: Self-pay | Admitting: Internal Medicine

## 2019-04-20 LAB — NOVEL CORONAVIRUS, NAA (HOSP ORDER, SEND-OUT TO REF LAB; TAT 18-24 HRS): SARS-CoV-2, NAA: NOT DETECTED

## 2019-04-20 NOTE — Telephone Encounter (Signed)
Sent to the pharmacy by e-scribe. 

## 2019-04-20 NOTE — Telephone Encounter (Signed)
I called pt back and advised her that her appt with TP is on 04/22/2019 with PFT at 3 before the visit. Pt understood. Nothing further is needed.

## 2019-04-20 NOTE — Telephone Encounter (Signed)
Pt now scheduled for cpx.  Sent to the pharmacy by e-scribe.

## 2019-04-20 NOTE — Telephone Encounter (Signed)
Yes, ok to to refill for 90 + 1

## 2019-04-21 ENCOUNTER — Other Ambulatory Visit: Payer: Self-pay | Admitting: Adult Health

## 2019-04-21 DIAGNOSIS — R0602 Shortness of breath: Secondary | ICD-10-CM

## 2019-04-21 NOTE — Progress Notes (Signed)
Office Visit Note  Patient: Julie Rice             Date of Birth: Apr 08, 1936           MRN: [id number]             PCP: [name], NP Referring: [name], NP Visit Date: 05/03/2019 Occupation: retired  Subjective:  Pain in multiple joints.   History of Present Illness: Julie Rice is a 83 y.o. female seen in consultation per request of her PCP.  According to patient her symptoms started in early 80s with pain and stiffness in her bilateral hands.  Over time she noticed deformities in bilateral hands.  She was seen by hand specialist who did surgery on her left hand.  She recalls having PIP surgery on the left second finger and the DIP fusion of her left third finger and left fourth finger PIP replacement.  She states she is also had discomfort in her both shoulders since 1980s.  She has taken Mobic for many years.  In 1990 she started having increased right knee joint pain for which she was seen by Dr. Alvan Dame and had right total knee replacement.  She states her pain was initially better but now the pain has recurred.  She is been also having discomfort in her left knee joint for which Dr. Alvan Dame gave her cortisone injection about 6 months ago.  She has been having pain in her bilateral hips.  She also has thoracic discomfort.  She is concerned about osteoporosis that she has noted some curvature in her spine.  She has chronic lower back pain.  She had fusion of her lumbar spine in the past.  She states she has numbness in her bilateral feet due to neuropathy.  She is very stiff all over.  Activities of Daily Living:  Patient reports morning stiffness for several hours.   Patient Reports nocturnal pain.  Difficulty dressing/grooming: Denies Difficulty climbing stairs: Reports Difficulty getting out of chair: Denies Difficulty using hands for taps, buttons, cutlery, and/or writing: Reports  Review of Systems  Constitutional: Positive for fatigue. Negative for night sweats,  weight gain and weight loss.  HENT: Positive for mouth dryness. Negative for mouth sores, trouble swallowing, trouble swallowing and nose dryness.   Eyes: Positive for dryness. Negative for pain, redness, itching and visual disturbance.  Respiratory: Positive for shortness of breath. Negative for cough, wheezing and difficulty breathing.   Cardiovascular: Negative for chest pain, palpitations, hypertension, irregular heartbeat and swelling in legs/feet.  Gastrointestinal: Positive for constipation. Negative for blood in stool and diarrhea.  Endocrine: Negative for increased urination.  Genitourinary: Negative for painful urination, pelvic pain and vaginal dryness.  Musculoskeletal: Positive for arthralgias, joint pain, joint swelling and morning stiffness. Negative for myalgias, muscle weakness, muscle tenderness and myalgias.  Skin: Negative for color change, rash, hair loss, redness, skin tightness, ulcers and sensitivity to sunlight.  Allergic/Immunologic: Negative for susceptible to infections.  Neurological: Positive for weakness. Negative for dizziness, headaches, memory loss and night sweats.  Hematological: Negative for swollen glands.  Psychiatric/Behavioral: Positive for depressed mood. Negative for confusion and sleep disturbance. The patient is nervous/anxious.     PMFS History:  Patient Active Problem List   Diagnosis Date Noted   Asthma 04/22/2019   Abnormal CT of the chest 02/23/2019   Esophageal motility disorder    Esophageal dysmotility    Loss of weight    Moderate protein-calorie malnutrition ([place])    Hematemesis 04/06/2018  Periesophageal hiatal hernia 08/02/2016   Dysphagia 05/10/2016   Esophageal stricture 05/10/2016   Epigastric pain 05/10/2016   Generalized anxiety disorder 02/06/2012   Major depressive disorder, recurrent ([place]) 02/05/2012   Hypokalemia 11/09/2011   Nausea and vomiting in adult 11/08/2011   Dehydration 11/08/2011   Tremors  of nervous system 11/08/2011   Hypothyroidism 05/10/2008   Dyslipidemia 05/10/2008   UNSPECIFIED ANEMIA 05/10/2008   CAROTID ARTERY DISEASE 05/10/2008   Transient cerebral ischemia 05/10/2008   Chronic back pain 05/10/2008   OSTEOPENIA 05/10/2008   Dyspnea 05/10/2008   ESOPHAGEAL STRICTURE 01/19/2008   Essential hypertension 04/29/2007   GERD 04/29/2007   Osteoarthritis 04/29/2007   SPONDYLOSIS 04/29/2007    Past Medical History:  Diagnosis Date   Anemia    Anxiety    Bipolar disorder ([place])    Blood transfusion 1991   autologous pts own blood given    CAD (coronary artery disease)    Cataract    Chest pain    "@ rest, lying down, w/exertion"   Chronic back pain    "mostly lower back but I do have upper back pain regularly" (04/06/2018)   COPD (chronic obstructive pulmonary disease) ([facility])    Depression    Esophageal stricture    Fecal occult blood test positive    Fibromyalgia    GERD (gastroesophageal reflux disease)    Heart murmur    "slight" (04/06/2018)   Hiatal hernia    Hyperlipidemia    Patient denies   Hypertension    Internal hemorrhoids    Lumbago    Mitral regurgitation    Osteoarthritis    Osteoporosis    Pneumonia ~ 01/2018   PONV (postoperative nausea and vomiting)    severe ponv, "in the past" (04/06/2018)   Rectal bleeding    Rheumatoid arthritis ([place])    Spondylosis    TIA (transient ischemic attack) 2013   Urinary incontinence    wears depends    Family History  Problem Relation Age of Onset   Kidney disease Mother    Hypertension Mother    Heart disease Father 24       MI   Suicidality Son    Heart disease Brother    Heart attack Brother    Colon cancer Neg Hx    Esophageal cancer Neg Hx    Pancreatic cancer Neg Hx    Rectal cancer Neg Hx    Stomach cancer Neg Hx    Past Surgical History:  Procedure Laterality Date   ABDOMINAL HYSTERECTOMY  1982   BACK SURGERY     BALLOON  DILATION N/A 09/21/2018   Procedure: BALLOON DILATION;  Surgeon: Irene Shipper, MD;  Location: [facility];  Service: Endoscopy;  Laterality: N/A;   BLADDER SUSPENSION  1980's   BOTOX INJECTION N/A 09/21/2018   Procedure: BOTOX INJECTION;  Surgeon: Irene Shipper, MD;  Location: [facility];  Service: Endoscopy;  Laterality: N/A;   CATARACT EXTRACTION W/ INTRAOCULAR LENS  IMPLANT, BILATERAL Bilateral 2010   DILATION AND CURETTAGE OF UTERUS  1961   ESOPHAGEAL MANOMETRY N/A 03/25/2018   Procedure: ESOPHAGEAL MANOMETRY (EM);  Surgeon: Mauri Pole, MD;  Location: [facility];  Service: Endoscopy;  Laterality: N/A;   ESOPHAGOGASTRODUODENOSCOPY (EGD) WITH PROPOFOL N/A 04/07/2018   Procedure: ESOPHAGOGASTRODUODENOSCOPY (EGD) WITH PROPOFOL;  Surgeon: Doran Stabler, MD;  Location: [facility];  Service: Gastroenterology;  Laterality: N/A;   ESOPHAGOGASTRODUODENOSCOPY (EGD) WITH PROPOFOL N/A 09/21/2018   Procedure: ESOPHAGOGASTRODUODENOSCOPY (EGD) WITH PROPOFOL;  Surgeon:  Irene Shipper, MD;  Location: [facility];  Service: Endoscopy;  Laterality: N/A;   HERNIA REPAIR     HIATAL HERNIA REPAIR N/A 08/02/2016   Procedure: LAPAROSCOPIC REPAIR OF LARGE  HIATAL HERNIA;  Surgeon: Excell Seltzer, MD;  Location: [facility];  Service: General;  Laterality: N/A;   JOINT REPLACEMENT     LAPAROSCOPIC NISSEN FUNDOPLICATION N/A 03/05/7740   Procedure: LAPAROSCOPIC NISSEN FUNDOPLICATION;  Surgeon: Excell Seltzer, MD;  Location: [facility];  Service: General;  Laterality: N/A;   [employer]; 1994; [id number]   "I've got 2 stainless steel rods; 6 screws; 2 ray cages"took bone from right hip to put in back   [place] Right ~ [place]  ~ [place] History Narrative   Retired    Aeronautical engineer History  Administered Date(s) Administered   Influenza Whole 09/04/2007, 08/16/2008    Influenza, High Dose Seasonal PF 08/04/2018   Influenza,inj,Quad PF,6+ Mos 08/17/2013, 09/02/2014   Pneumococcal Conjugate-13 09/02/2014   Pneumococcal Polysaccharide-23 06/01/2013   Td 04/08/2006   Zoster 04/08/2006     Objective: Vital Signs: BP (!) 155/87 (BP Location: Right Arm, Patient Position: Sitting, Cuff Size: Normal)    Pulse 88    Resp 14    Ht 5' 1.25" (1.556 m)    Wt 147 lb (66.7 kg)    BMI 27.55 kg/m    Physical Exam Vitals signs and nursing note reviewed.  Constitutional:      Appearance: She is well-developed.  HENT:     Head: Normocephalic and atraumatic.  Eyes:     Conjunctiva/sclera: Conjunctivae normal.  Neck:     Musculoskeletal: Normal range of motion.  Cardiovascular:     Rate and Rhythm: Normal rate and regular rhythm.     Heart sounds: Normal heart sounds.  Pulmonary:     Effort: Pulmonary effort is normal.     Breath sounds: Normal breath sounds.  Abdominal:     General: Bowel sounds are normal.     Palpations: Abdomen is soft.  Lymphadenopathy:     Cervical: No cervical adenopathy.  Skin:    General: Skin is warm and dry.     Capillary Refill: Capillary refill takes less than 2 seconds.  Neurological:     Mental Status: She is alert and oriented to person, place, and time.  Psychiatric:        Behavior: Behavior normal.      Musculoskeletal Exam: C-spine limited range of motion.  She has thoracic kyphosis.  Lumbar spine she has extensive surgical scar and limited range of motion.  Shoulder joints are in good range of motion.  Elbow joints with good range of motion with no synovitis.  She had good range of motion of bilateral wrist joints.  She has bilateral PIP and DIP subluxation and thickening with no synovitis.  She has bilateral CMC arthritis.  Hip joints were in fairly good range of motion.  Right total knee joint is replaced.  She had discomfort range of motion of left knee joint.  She had some pedal edema over the bilateral lower  extremities.  PIP and DIP changes were noted in her feet consistent with osteoarthritis.  CDAI Exam: CDAI Score: -- Patient Global: --; Provider Global: -- Swollen: --; Tender: -- Joint Exam   No joint exam has been documented for this visit   There is currently no information  documented on the homunculus. Go to the Rheumatology activity and complete the homunculus joint exam.  Investigation: No additional findings.  Imaging: Xr Thoracic Spine 2 View  Result Date: 05/03/2019 Thoracic kyphosis, and scoliosis was noted.  Multilevel spondylosis was noted. Impression: These findings are consistent with thoracic kyphosis, scoliosis and multilevel spondylosis.  Xr Hand 2 View Left  Result Date: 05/03/2019 Subluxation of left first MCP joint was noted.  Narrowing of second third and fourth MCP joint was noted.  Severe narrowing of all PIP and DIP joints was noted.  Cystic changes were noted in the PIP joints.  Mild intercarpal radiocarpal joint space narrowing was noted.  Severe narrowing of carpal metacarpal joint was noted. Impression: These findings are consistent with severe osteoarthritis although inflammatory arthritis can cause MCP and intercarpal joint involvement.  Xr Hand 2 View Right  Result Date: 05/03/2019 Second and third MCP joint narrowing was noted.  Severe narrowing of all PIP and DIP joints was noted.  Cystic changes were noted around the PIP and DIP joints.  Intercarpal and radiocarpal joint space narrowing was noted. Impression: These findings are consistent with severe osteoarthritis and possible inflammatory arthritis.  Xr Knee 3 View Left  Result Date: 05/03/2019 Severe lateral compartment narrowing with lateral osteophytes was noted.  Severe patellofemoral narrowing was noted. Impression: These findings are consistent with severe osteoarthritis and severe chondromalacia patella.   Recent Labs: Lab Results  Component Value Date   WBC 7.2 03/30/2019   HGB 10.8 (L)  03/30/2019   PLT 295.0 03/30/2019   NA 138 03/30/2019   K 4.2 03/30/2019   CL 100 03/30/2019   CO2 31 03/30/2019   GLUCOSE 94 03/30/2019   BUN 20 03/30/2019   CREATININE 0.94 03/30/2019   BILITOT 0.6 03/09/2019   ALKPHOS 65 03/09/2019   AST 18 03/09/2019   ALT 14 03/09/2019   PROT 7.1 03/09/2019   ALBUMIN 4.0 03/09/2019   CALCIUM 9.1 03/30/2019   GFRAA 48 (L) 03/09/2019    Speciality Comments: No specialty comments available.  Procedures:  No procedures performed Allergies: Morphine and Tape   Assessment / Plan:     Visit Diagnoses: Pain in both hands -patient complains of pain in both hands.  The clinical and radiographic findings are consistent with severe osteoarthritis.  Although she does have some MCP and intercarpal joint narrowing which could be seen in inflammatory arthritis.  I will obtain rheumatoid factor, anti-CCP, uric acid and ESR today.  I do not see any synovitis on examination.  Plan: XR Hand 2 View Right, XR Hand 2 View Left,   Primary osteoarthritis of both hands.  Patient has severe osteoarthritis with subluxation of multiple joints.  Chronic pain of left knee -patient has severe lateral compartment narrowing and osteophytes consistent with severe osteoarthritis.  Plan: XR KNEE 3 VIEW LEFT, patient has seen Dr. Alvan Dame in the past.  Have advised her to see Dr. Alvan Dame again.  S/P total knee replacement, right -patient had right total knee replacement several years ago by Dr. Alvan Dame and has ongoing discomfort.  Pain in thoracic spine - Plan: XR Thoracic Spine 2 View, x-ray shows multilevel spondylosis, kyphosis and a scoliosis.  She has chronic thoracic discomfort.  Other kyphosis of thoracic region   DDD (degenerative disc disease), lumbar - Status post fusion -she has chronic discomfort.  History of osteopenia -I do not have any recent DEXA available.  Patient is very much concerned about possible osteoporosis.  Have advised her to schedule an appointment  with her  PCP to discuss if her bone density is due.  Other medical problems are listed as follows:  Essential hypertension - Plan: Blood pressure is elevated today.  Have advised her to monitor blood pressure closely and follow-up with her PCP.  Hemispheric carotid artery syndrome  Esophageal dysmotility   Esophageal stricture   History of gastroesophageal reflux (GERD)   Periesophageal hiatal hernia   Dyslipidemia  History of anxiety   Orders: Orders Placed This Encounter  Procedures   XR Hand 2 View Right   XR Hand 2 View Left   XR KNEE 3 VIEW LEFT   XR Thoracic Spine 2 View   Rheumatoid factor   Sedimentation rate   Uric acid   Cyclic citrul peptide antibody, IgG   No orders of the defined types were placed in this encounter.   Face-to-face time spent with patient was 45 minutes. Greater than 50% of time was spent in counseling and coordination of care.  Follow-Up Instructions: Return for Osteoarthritis.   Bo Merino, MD  Note - This record has been created using Editor, commissioning.  Chart creation errors have been sought, but may not always  have been located. Such creation errors do not reflect on  the standard of medical care.

## 2019-04-22 ENCOUNTER — Other Ambulatory Visit: Payer: Self-pay

## 2019-04-22 ENCOUNTER — Ambulatory Visit (INDEPENDENT_AMBULATORY_CARE_PROVIDER_SITE_OTHER): Payer: Medicare Other | Admitting: Internal Medicine

## 2019-04-22 ENCOUNTER — Encounter: Payer: Self-pay | Admitting: Adult Health

## 2019-04-22 ENCOUNTER — Ambulatory Visit: Payer: Medicare Other | Admitting: Adult Health

## 2019-04-22 DIAGNOSIS — R0602 Shortness of breath: Secondary | ICD-10-CM

## 2019-04-22 DIAGNOSIS — J45909 Unspecified asthma, uncomplicated: Secondary | ICD-10-CM | POA: Insufficient documentation

## 2019-04-22 DIAGNOSIS — R9389 Abnormal findings on diagnostic imaging of other specified body structures: Secondary | ICD-10-CM | POA: Diagnosis not present

## 2019-04-22 DIAGNOSIS — K224 Dyskinesia of esophagus: Secondary | ICD-10-CM | POA: Diagnosis not present

## 2019-04-22 DIAGNOSIS — J4530 Mild persistent asthma, uncomplicated: Secondary | ICD-10-CM

## 2019-04-22 LAB — PULMONARY FUNCTION TEST
DL/VA % pred: 87 %
DL/VA: 3.57 ml/min/mmHg/L
DLCO unc % pred: 76 %
DLCO unc: 14.39 ml/min/mmHg
FEF 25-75 Post: 1.59 L/sec
FEF 25-75 Pre: 1.12 L/sec
FEF2575-%Change-Post: 41 %
FEF2575-%Pred-Post: 117 %
FEF2575-%Pred-Pre: 82 %
FEV1-%Change-Post: 8 %
FEV1-%Pred-Post: 87 %
FEV1-%Pred-Pre: 80 %
FEV1-Post: 1.67 L
FEV1-Pre: 1.54 L
FEV1FVC-%Change-Post: 4 %
FEV1FVC-%Pred-Pre: 101 %
FEV6-%Change-Post: 5 %
FEV6-%Pred-Post: 88 %
FEV6-%Pred-Pre: 83 %
FEV6-Post: 2.15 L
FEV6-Pre: 2.04 L
FEV6FVC-%Change-Post: 1 %
FEV6FVC-%Pred-Post: 105 %
FEV6FVC-%Pred-Pre: 104 %
FVC-%Change-Post: 4 %
FVC-%Pred-Post: 83 %
FVC-%Pred-Pre: 80 %
FVC-Post: 2.15 L
FVC-Pre: 2.06 L
Post FEV1/FVC ratio: 78 %
Post FEV6/FVC ratio: 100 %
Pre FEV1/FVC ratio: 75 %
Pre FEV6/FVC Ratio: 99 %
RV % pred: 178 %
RV: 4.31 L
TLC % pred: 126 %
TLC: 6.39 L

## 2019-04-22 MED ORDER — BUDESONIDE-FORMOTEROL FUMARATE 80-4.5 MCG/ACT IN AERO: 2.0000 | INHALATION_SPRAY | Freq: Two times a day (BID) | RESPIRATORY_TRACT | 0 refills | Status: DC

## 2019-04-22 MED ORDER — BUDESONIDE-FORMOTEROL FUMARATE 80-4.5 MCG/ACT IN AERO: 2.0000 | INHALATION_SPRAY | Freq: Two times a day (BID) | RESPIRATORY_TRACT | 5 refills | Status: DC

## 2019-04-22 NOTE — Patient Instructions (Addendum)
Begin Symbicort 17mcg 2 puffs Twice daily  , rinse after use.  Aspiration preacutions as discussed.  Protonix 40mg  Twice daily  Before meal .  Pepcid 20mg  At bedtime   GERD diet . No eating 3 hrs before bedtime .  Albuterol Inhaler 1-2 puffs every 6hr as needed for wheezing/shortness of breath  HRCT chest as planned in July  Follow up with Rheumatology as planned.  Follow up in 6-8 weeks with Dr. Melvyn Novas  Or [name] NP and As needed   Please contact office for sooner follow up if symptoms do not improve or worsen or seek emergency care

## 2019-04-22 NOTE — Progress Notes (Signed)
Full PFT performed today. °

## 2019-04-22 NOTE — Assessment & Plan Note (Signed)
Aspiration precautions 

## 2019-04-22 NOTE — Assessment & Plan Note (Signed)
CT chest April 2020 showed diffuse nodules and mild tree-in-bud nodularity.  Will repeat CT chest next month as planned.  Clinically she does appear to be improving.

## 2019-04-22 NOTE — Progress Notes (Signed)
@Patient  ID: Julie Rice, female    DOB: 01-09-1936, 83 y.o.   MRN: [id number]  Chief Complaint  Patient presents with  . Follow-up    Dyspnea     Referring provider: [name], NP  HPI: 83 year old female never smoker seen for initial pulmonary consult December 31, 2017 for cough and shortness of breath for several years.  She has a history of recurrent pneumonia. Medical history significant for esophageal dysmotility, GERD, large symptomatic hiatal hernia status post Nissen fundoplication in September 2017.  Esophageal stricture status post dilatation April 2018, upper endoscopy with Botox injection and balloon dilatation of the esophagus in November 2019. Patient carries a diagnosis of rheumatoid arthritis however has never seen a rheumatologist and is only on nonsteroidals.  TEST/EVENTS :  Previous CT chest in February 2019 she had a ncecrotic mass vs consolidation with necrosis along the right upper lobe measuring max 6.5cm. Serial Chest xray showed resoultion with residual scarring .   CT chest February 04, 2019- for PE, diffuse groundglass nodules and mild tree-in-bud nodularity,increased diffuse distention of the thoracic esophagus containing a large amount of fluid andair   04/22/2019 Follow up : Cough /dyspnea , Recurrent PNA  Patient returns for a one-month follow-up patient was seen last time for a slow to resolve bronchitic exacerbation.  She had ongoing cough and shortness of breath.  Chest x-ray showed persistent right upper lobe small opacity CT chest in April showed diffuse groundglass nodules and mild tree-in-bud nodularity.  Patient has underlying severe esophageal dysmotility and is prone to aspiration pneumonia.  She had completed a 7-day course of Augmentin. She says she is feeling better.  Cough has decreased substantially.  She still gets short of breath intermittently especially in the morning time.  Pulmonary function testing today showed FEV1 at 87%, ratio  78, FVC 83%.  Positive mid flow reversibility.  DLCO 76%.  Patient has an upcoming CT chest planned for next month. She denies any discolored mucus fever chest pain or orthopnea. Patient carries a diagnosis of rheumatoid arthritis.  Is on nonsteroidals.  She has an upcoming appointment with rheumatology next month.  Allergies  Allergen Reactions  . Morphine Other (See Comments)    Flushing, rash, itching  . Tape Other (See Comments)    Band aides, adhesive tape Redness and pulls skin off    Immunization History  Administered Date(s) Administered  . Influenza Whole 09/04/2007, 08/16/2008  . Influenza, High Dose Seasonal PF 08/04/2018  . Influenza,inj,Quad PF,6+ Mos 08/17/2013, 09/02/2014  . Pneumococcal Conjugate-13 09/02/2014  . Pneumococcal Polysaccharide-23 06/01/2013  . Td 04/08/2006  . Zoster 04/08/2006    Past Medical History:  Diagnosis Date  . Anemia   . Anxiety   . Bipolar disorder ([place])   . Blood transfusion 1991   autologous pts own blood given   . CAD (coronary artery disease)   . Cataract   . Chest pain    "@ rest, lying down, w/exertion"  . Chronic back pain    "mostly lower back but I do have upper back pain regularly" (04/06/2018)  . COPD (chronic obstructive pulmonary disease) ([place])   . Depression   . Esophageal stricture   . Fecal occult blood test positive   . Fibromyalgia   . GERD (gastroesophageal reflux disease)   . Heart murmur    "slight" (04/06/2018)  . Hiatal hernia   . Hyperlipidemia    Patient denies  . Hypertension   . Internal hemorrhoids   . Lumbago   .  Mitral regurgitation   . Osteoarthritis   . Osteoporosis   . Pneumonia ~ 01/2018  . PONV (postoperative nausea and vomiting)    severe ponv, "in the past" (04/06/2018)  . Rectal bleeding   . Rheumatoid arthritis ([facility])   . Spondylosis   . TIA (transient ischemic attack) 2013  . Urinary incontinence    wears depends    Tobacco History: Social History   Tobacco Use  Smoking  Status Never Smoker  Smokeless Tobacco Never Used   Counseling given: Not Answered   Outpatient Medications Prior to Visit  Medication Sig Dispense Refill  . acetaminophen (TYLENOL) 500 MG tablet Take 1,000 mg by mouth 2 (two) times daily as needed for moderate pain (arthritis).     Marland Kitchen albuterol (VENTOLIN HFA) 108 (90 Base) MCG/ACT inhaler Inhale 2 puffs into the lungs every 6 (six) hours as needed for wheezing or shortness of breath. 1 Inhaler 6  . amLODipine (NORVASC) 2.5 MG tablet Take 1 tablet (2.5 mg total) by mouth daily. (Patient taking differently: Take 5 mg by mouth daily. ) 90 tablet 3  . aspirin 81 MG tablet Take 1 tablet (81 mg total) by mouth daily. For cardiovascular health.    . benzonatate (TESSALON) 200 MG capsule Take 1 capsule (200 mg total) by mouth 2 (two) times daily as needed for cough. 20 capsule 1  . clonazePAM (KLONOPIN) 0.5 MG tablet Take 0.5 mg by mouth 3 (three) times daily.     Marland Kitchen gabapentin (NEURONTIN) 100 MG capsule TAKE 1 CAPSULE BY MOUTH THREE TIMES A DAY (Patient taking differently: Take 100 mg by mouth 3 (three) times daily. TAKE 1 CAPSULE BY MOUTH THREE TIMES A DAY) 270 capsule 1  . losartan-hydrochlorothiazide (HYZAAR) 100-25 MG tablet Take 1 tablet by mouth daily. 90 tablet 3  . meloxicam (MOBIC) 15 MG tablet Take 1 tablet (15 mg total) by mouth daily. 90 tablet 3  . montelukast (SINGULAIR) 10 MG tablet TAKE 1 TABLET BY MOUTH EVERYDAY AT BEDTIME 90 tablet 0  . Multiple Vitamin (MULTIVITAMIN) tablet Take 1 tablet by mouth daily. Vitamin supplement.    . ondansetron (ZOFRAN) 4 MG tablet Take one tablet every 4-6 hours as needed for nausea (Patient taking differently: Take 4 mg by mouth every 4 (four) hours as needed for nausea or vomiting. ) 60 tablet 3  . pantoprazole (PROTONIX) 40 MG tablet Take 1 tablet (40 mg total) by mouth 2 (two) times daily. 60 tablet 3  . potassium chloride (K-DUR) 10 MEQ tablet Take 1 tablet (10 mEq total) by mouth daily. 90 tablet 1   . sertraline (ZOLOFT) 100 MG tablet TAKE 1 TABLET BY MOUTH EVERYDAY AT BEDTIME 90 tablet 0  . sucralfate (CARAFATE) 1 g tablet Take 1 tablet (1 g total) by mouth 4 (four) times daily. Dissolve tablet in 10 ml warm water (Patient taking differently: Take 1 g by mouth 4 (four) times daily as needed (heartburn). Dissolve tablet in 10 ml warm water) 56 tablet 1  . tetrahydrozoline 0.05 % ophthalmic solution Place 1 drop into both eyes daily.     No facility-administered medications prior to visit.      Review of Systems:   Constitutional:   No  weight loss, night sweats,  Fevers, chills, + fatigue, or  lassitude.  HEENT:   No headaches,  Difficulty swallowing,  Tooth/dental problems, or  Sore throat,                No sneezing, itching, ear ache,  nasal congestion, post nasal drip,   CV:  No chest pain,  Orthopnea, PND, swelling in lower extremities, anasarca, dizziness, palpitations, syncope.   GI  No heartburn, indigestion, abdominal pain, nausea, vomiting, diarrhea, change in bowel habits, loss of appetite, bloody stools.   Resp:    No non-productive cough,  No coughing up of blood.  No change in color of mucus.  No wheezing.  No chest wall deformity  Skin: no rash or lesions.  GU: no dysuria, change in color of urine, no urgency or frequency.  No flank pain, no hematuria   MS:  No joint pain or swelling.  No decreased range of motion.  No back pain.    Physical Exam  BP 116/70   Pulse 76   Temp 98.2 F (36.8 C) (Oral)   Ht 5\' 3"  (1.6 m)   Wt 150 lb (68 kg)   SpO2 98%   BMI 26.57 kg/m   GEN: A/Ox3; pleasant , NAD, elderly   HEENT:  [place]/AT,    NOSE-clear, THROAT-clear, no lesions, no postnasal drip or exudate noted.   NECK:  Supple w/ fair ROM; no JVD; normal carotid impulses w/o bruits; no thyromegaly or nodules palpated; no lymphadenopathy.    RESP  Clear  P & A; w/o, wheezes/ rales/ or rhonchi. no accessory muscle use, no dullness to percussion  CARD:  RRR, no m/r/g,  tr  peripheral edema, pulses intact, no cyanosis or clubbing.  GI:   Soft & nt; nml bowel sounds; no organomegaly or masses detected.   Musco: Warm bil, no deformities or joint swelling noted.   Neuro: alert, no focal deficits noted.    Skin: Warm, no lesions or rashes    Lab Results:  CBC    Component Value Date/Time   WBC 7.2 03/30/2019 1205   RBC 3.84 (L) 03/30/2019 1205   HGB 10.8 (L) 03/30/2019 1205   HCT 32.7 (L) 03/30/2019 1205   PLT 295.0 03/30/2019 1205   MCV 84.9 03/30/2019 1205   MCH 27.5 03/09/2019 1843   MCHC 33.2 03/30/2019 1205   RDW 16.2 (H) 03/30/2019 1205   LYMPHSABS 1.5 03/30/2019 1205   MONOABS 0.4 03/30/2019 1205   EOSABS 0.3 03/30/2019 1205   BASOSABS 0.1 03/30/2019 1205    BMET    Component Value Date/Time   NA 138 03/30/2019 1205   K 4.2 03/30/2019 1205   CL 100 03/30/2019 1205   CO2 31 03/30/2019 1205   GLUCOSE 94 03/30/2019 1205   BUN 20 03/30/2019 1205   CREATININE 0.94 03/30/2019 1205   CALCIUM 9.1 03/30/2019 1205   GFRNONAA 41 (L) 03/09/2019 1843   GFRAA 48 (L) 03/09/2019 1843    BNP No results found for: BNP  ProBNP    Component Value Date/Time   PROBNP 196.0 (H) 01/12/2019 1427    Imaging: No results found.    PFT Results Latest Ref Rng & Units 04/22/2019  FVC-Pre L 2.06  FVC-Predicted Pre % 80  FVC-Post L 2.15  FVC-Predicted Post % 83  Pre FEV1/FVC % % 75  Post FEV1/FCV % % 78  FEV1-Pre L 1.54  FEV1-Predicted Pre % 80  FEV1-Post L 1.67  DLCO UNC% % 76  DLCO COR %Predicted % 87  TLC L 6.39  TLC % Predicted % 126  RV % Predicted % 178    No results found for: NITRICOXIDE      Assessment & Plan:   Abnormal CT of the chest CT chest April 2020 showed diffuse  nodules and mild tree-in-bud nodularity.  Will repeat CT chest next month as planned.  Clinically she does appear to be improving.  Asthma Patient has a small airways disease is with.  She has some ongoing dyspnea especially in the morning time.   This is more cough variant asthma/reactive airways.  She has underlying esophageal to smoke is prone to recurrent pneumonia. We will try patient on Symbicort 80.  Plan  Patient Instructions  Begin Symbicort 2 puffs Twice daily  , rinse after use.  Aspiration preacutions as discussed.  Protonix 40mg  Twice daily  Before meal .  Pepcid 20mg  At bedtime   GERD diet . No eating 3 hrs before bedtime .  Albuterol Inhaler 1-2 puffs every 6hr as needed for wheezing/shortness of breath  HRCT chest as planned in July  Follow up with Rheumatology as planned.  Follow up in 6-8 weeks with Dr.  Or [name] NP and As needed   Please contact office for sooner follow up if symptoms do not improve or worsen or seek emergency care    n   Esophageal dysmotility Aspiration precautions     Tammy [name], NP 04/22/2019

## 2019-04-22 NOTE — Assessment & Plan Note (Signed)
Patient has a small airways disease is with.  She has some ongoing dyspnea especially in the morning time.  This is more cough variant asthma/reactive airways.  She has underlying esophageal to smoke is prone to recurrent pneumonia. We will try patient on Symbicort 80.  Plan  Patient Instructions  Begin Symbicort 97mcg 2 puffs Twice daily  , rinse after use.  Aspiration preacutions as discussed.  Protonix 40mg  Twice daily  Before meal .  Pepcid 20mg  At bedtime   GERD diet . No eating 3 hrs before bedtime .  Albuterol Inhaler 1-2 puffs every 6hr as needed for wheezing/shortness of breath  HRCT chest as planned in July  Follow up with Rheumatology as planned.  Follow up in 6-8 weeks with Dr. Melvyn Novas  Or Kalianne Fetting NP and As needed   Please contact office for sooner follow up if symptoms do not improve or worsen or seek emergency care    n

## 2019-04-23 NOTE — Progress Notes (Signed)
Greater than 5 minutes, yet less than 10 minutes of time have been spent researching, coordinating, and implementing care for this patient today.  Thank you for the details you included in the comment boxes. Those details are very helpful in determining the best course of treatment for you and help us to provide the best care.  

## 2019-04-24 DIAGNOSIS — R0789 Other chest pain: Secondary | ICD-10-CM | POA: Diagnosis not present

## 2019-04-24 DIAGNOSIS — R9431 Abnormal electrocardiogram [ECG] [EKG]: Secondary | ICD-10-CM | POA: Diagnosis not present

## 2019-04-24 DIAGNOSIS — R0989 Other specified symptoms and signs involving the circulatory and respiratory systems: Secondary | ICD-10-CM | POA: Diagnosis not present

## 2019-04-26 ENCOUNTER — Encounter: Payer: Self-pay | Admitting: Adult Health

## 2019-04-26 NOTE — Progress Notes (Signed)
Chart and office note reviewed in detail  > agree with a/p as outlined    

## 2019-04-27 ENCOUNTER — Other Ambulatory Visit: Payer: Self-pay

## 2019-04-27 ENCOUNTER — Other Ambulatory Visit: Payer: Self-pay | Admitting: Adult Health

## 2019-04-27 DIAGNOSIS — R0989 Other specified symptoms and signs involving the circulatory and respiratory systems: Secondary | ICD-10-CM

## 2019-04-27 DIAGNOSIS — R0789 Other chest pain: Secondary | ICD-10-CM

## 2019-04-27 DIAGNOSIS — R9431 Abnormal electrocardiogram [ECG] [EKG]: Secondary | ICD-10-CM

## 2019-05-03 ENCOUNTER — Ambulatory Visit: Payer: Self-pay

## 2019-05-03 ENCOUNTER — Other Ambulatory Visit: Payer: Self-pay

## 2019-05-03 ENCOUNTER — Ambulatory Visit (INDEPENDENT_AMBULATORY_CARE_PROVIDER_SITE_OTHER): Payer: Medicare Other | Admitting: Rheumatology

## 2019-05-03 ENCOUNTER — Encounter: Payer: Self-pay | Admitting: Rheumatology

## 2019-05-03 VITALS — BP 155/87 | HR 88 | Resp 14 | Ht 61.25 in | Wt 147.0 lb

## 2019-05-03 DIAGNOSIS — Z8659 Personal history of other mental and behavioral disorders: Secondary | ICD-10-CM

## 2019-05-03 DIAGNOSIS — G8929 Other chronic pain: Secondary | ICD-10-CM

## 2019-05-03 DIAGNOSIS — M546 Pain in thoracic spine: Secondary | ICD-10-CM

## 2019-05-03 DIAGNOSIS — E785 Hyperlipidemia, unspecified: Secondary | ICD-10-CM

## 2019-05-03 DIAGNOSIS — K224 Dyskinesia of esophagus: Secondary | ICD-10-CM

## 2019-05-03 DIAGNOSIS — M79641 Pain in right hand: Secondary | ICD-10-CM | POA: Diagnosis not present

## 2019-05-03 DIAGNOSIS — M79642 Pain in left hand: Secondary | ICD-10-CM

## 2019-05-03 DIAGNOSIS — M25562 Pain in left knee: Secondary | ICD-10-CM

## 2019-05-03 DIAGNOSIS — Z8719 Personal history of other diseases of the digestive system: Secondary | ICD-10-CM

## 2019-05-03 DIAGNOSIS — M19042 Primary osteoarthritis, left hand: Secondary | ICD-10-CM

## 2019-05-03 DIAGNOSIS — Z8739 Personal history of other diseases of the musculoskeletal system and connective tissue: Secondary | ICD-10-CM

## 2019-05-03 DIAGNOSIS — Z96651 Presence of right artificial knee joint: Secondary | ICD-10-CM

## 2019-05-03 DIAGNOSIS — K222 Esophageal obstruction: Secondary | ICD-10-CM

## 2019-05-03 DIAGNOSIS — K449 Diaphragmatic hernia without obstruction or gangrene: Secondary | ICD-10-CM

## 2019-05-03 DIAGNOSIS — M19041 Primary osteoarthritis, right hand: Secondary | ICD-10-CM | POA: Diagnosis not present

## 2019-05-03 DIAGNOSIS — G451 Carotid artery syndrome (hemispheric): Secondary | ICD-10-CM

## 2019-05-03 DIAGNOSIS — M40294 Other kyphosis, thoracic region: Secondary | ICD-10-CM

## 2019-05-03 DIAGNOSIS — M5136 Other intervertebral disc degeneration, lumbar region: Secondary | ICD-10-CM

## 2019-05-03 DIAGNOSIS — I10 Essential (primary) hypertension: Secondary | ICD-10-CM

## 2019-05-04 LAB — CYCLIC CITRUL PEPTIDE ANTIBODY, IGG: Cyclic Citrullin Peptide Ab: <16 UNITS

## 2019-05-04 LAB — SEDIMENTATION RATE: Sed Rate: 11 mm/h (ref 0–30)

## 2019-05-04 LAB — URIC ACID: Uric Acid, Serum: 6.5 mg/dL (ref 2.5–7.0)

## 2019-05-04 LAB — RHEUMATOID FACTOR: Rheumatoid fact SerPl-aCnc: <14 IU/mL (ref <14)

## 2019-05-05 ENCOUNTER — Ambulatory Visit: Payer: Medicare Other | Admitting: Rheumatology

## 2019-05-07 ENCOUNTER — Encounter: Payer: Self-pay | Admitting: Rheumatology

## 2019-05-07 ENCOUNTER — Encounter: Payer: Self-pay | Admitting: Adult Health

## 2019-05-10 ENCOUNTER — Other Ambulatory Visit: Payer: Self-pay | Admitting: Adult Health

## 2019-05-10 DIAGNOSIS — Z760 Encounter for issue of repeat prescription: Secondary | ICD-10-CM

## 2019-05-10 NOTE — Telephone Encounter (Signed)
Patient has severe osteoarthritis in her knee.  She will need knee joint replacement.  Besides Tylenol and meloxicam , which she is taking I do not have any other recommendations.  All of her autoimmune work-up has been negative.  She may ask her PCP for pain management referral if the pain is unbearable.

## 2019-05-11 ENCOUNTER — Telehealth: Payer: Self-pay | Admitting: Adult Health

## 2019-05-11 ENCOUNTER — Other Ambulatory Visit: Payer: Self-pay | Admitting: Adult Health

## 2019-05-11 DIAGNOSIS — E2839 Other primary ovarian failure: Secondary | ICD-10-CM

## 2019-05-11 MED ORDER — CARVEDILOL 3.125 MG PO TABS: 3.1250 mg | ORAL_TABLET | Freq: Two times a day (BID) | ORAL | 3 refills | Status: DC

## 2019-05-11 MED ORDER — TRAMADOL HCL 50 MG PO TABS: 50.0000 mg | ORAL_TABLET | Freq: Three times a day (TID) | ORAL | 0 refills | Status: AC | PRN

## 2019-05-11 NOTE — Telephone Encounter (Signed)
Spoke to patient and informed her of her Holter monitor results.  Maximum heart rate was 193.  He has been monitoring her heart rate and blood pressure at home and she reports also readings between 70 and 140s.  Her blood pressure there is between 90s to 140s over 60s to 90s.  She does have an upcoming appointment in September with cardiology.  Can have her stop Norvasc and start Coreg 3.125 mg twice daily.  Have her follow-up with me in 2 weeks   She is also seen by rheumatology recently lab work came back negative but does have significant osteoarthritis.  She is currently taking Mobic and Tylenol which is not helping with her pain and she stays up most of the night in pain.  We will start her on a low-dose of tramadol and see how she responds to this.  She was advised to take it at night because it will make her sleepy and not to take it with Klonopin.

## 2019-05-11 NOTE — Progress Notes (Signed)
Bone Density.

## 2019-05-24 NOTE — Progress Notes (Signed)
Office Visit Note  Patient: Julie Rice             Date of Birth: 07-May-1936           MRN: [id number]             PCP: [name], NP Referring: [name], NP Visit Date: 06/07/2019 Occupation: '@GUAROCC' @  Subjective:  Pain in multiple joints   History of Present Illness: Julie Rice is a 83 y.o. female with history of osteoarthritis and DDD.  She has pain in multiple joints.  She has severe pain and intermittent swelling in both hands.  She has noticed more thickening and increased damage in her hands recently.  She has chronic pain in both knee joints.  She states the right knee replacement causes discomfort.  She uses aspercreme topically as needed.  She has chronic neck and lower back pain.  She takes tramadol very sparingly for pain relief.  She uses a can intermittently.     Activities of Daily Living:  Patient reports morning stiffness for 1.5 hours.   Patient Reports nocturnal pain.  Difficulty dressing/grooming: Denies Difficulty climbing stairs: Reports Difficulty getting out of chair: Denies Difficulty using hands for taps, buttons, cutlery, and/or writing: Reports  Review of Systems  Constitutional: Positive for fatigue.  HENT: Positive for mouth dryness. Negative for mouth sores and nose dryness.   Eyes: Positive for dryness. Negative for pain and visual disturbance.  Respiratory: Negative for cough, hemoptysis, shortness of breath and difficulty breathing.   Cardiovascular: Positive for swelling in legs/feet. Negative for chest pain, palpitations and hypertension.  Gastrointestinal: Positive for constipation and nausea. Negative for blood in stool and diarrhea.  Endocrine: Negative for increased urination.  Genitourinary: Negative for difficulty urinating and painful urination.  Musculoskeletal: Positive for arthralgias, joint pain, joint swelling, muscle weakness, morning stiffness and muscle tenderness. Negative for myalgias and myalgias.  Skin:  Negative for color change, pallor, rash, hair loss, nodules/bumps, skin tightness, ulcers and sensitivity to sunlight.  Allergic/Immunologic: Negative for susceptible to infections.  Hematological: Negative for bruising/bleeding tendency and swollen glands.  Psychiatric/Behavioral: Positive for sleep disturbance. Negative for depressed mood. The patient is not nervous/anxious.     PMFS History:  Patient Active Problem List   Diagnosis Date Noted  . Asthma 04/22/2019  . Abnormal CT of the chest 02/23/2019  . Esophageal motility disorder   . Esophageal dysmotility   . Loss of weight   . Moderate protein-calorie malnutrition ([place])   . Hematemesis 04/06/2018  . Periesophageal hiatal hernia 08/02/2016  . Dysphagia 05/10/2016  . Esophageal stricture 05/10/2016  . Epigastric pain 05/10/2016  . Generalized anxiety disorder 02/06/2012  . Major depressive disorder, recurrent ([place]) 02/05/2012  . Hypokalemia 11/09/2011  . Nausea and vomiting in adult 11/08/2011  . Dehydration 11/08/2011  . Tremors of nervous system 11/08/2011  . Hypothyroidism 05/10/2008  . Dyslipidemia 05/10/2008  . UNSPECIFIED ANEMIA 05/10/2008  . CAROTID ARTERY DISEASE 05/10/2008  . Transient cerebral ischemia 05/10/2008  . Chronic back pain 05/10/2008  . OSTEOPENIA 05/10/2008  . Dyspnea 05/10/2008  . ESOPHAGEAL STRICTURE 01/19/2008  . Essential hypertension 04/29/2007  . GERD 04/29/2007  . Osteoarthritis 04/29/2007  . SPONDYLOSIS 04/29/2007    Past Medical History:  Diagnosis Date  . Anemia   . Anxiety   . Bipolar disorder ([place])   . Blood transfusion 1991   autologous pts own blood given   . CAD (coronary artery disease)   . Cataract   . Chest  pain    "@ rest, lying down, w/exertion"  . Chronic back pain    "mostly lower back but I do have upper back pain regularly" (04/06/2018)  . COPD (chronic obstructive pulmonary disease) ([place])   . Depression   . Esophageal stricture   . Fecal occult blood test  positive   . Fibromyalgia   . GERD (gastroesophageal reflux disease)   . Heart murmur    "slight" (04/06/2018)  . Hiatal hernia   . Hyperlipidemia    Patient denies  . Hypertension   . Internal hemorrhoids   . Lumbago   . Mitral regurgitation   . Osteoarthritis   . Osteoporosis   . Pneumonia ~ 01/2018  . PONV (postoperative nausea and vomiting)    severe ponv, "in the past" (04/06/2018)  . Rectal bleeding   . Rheumatoid arthritis ([place])   . Spondylosis   . TIA (transient ischemic attack) 2013  . Urinary incontinence    wears depends    Family History  Problem Relation Age of Onset  . Kidney disease Mother   . Hypertension Mother   . Heart disease Father 72       MI  . Suicidality Son   . Heart disease Brother   . Heart attack Brother   . Colon cancer Neg Hx   . Esophageal cancer Neg Hx   . Pancreatic cancer Neg Hx   . Rectal cancer Neg Hx   . Stomach cancer Neg Hx    Past Surgical History:  Procedure Laterality Date  . ABDOMINAL HYSTERECTOMY  1982  . BACK SURGERY    . BALLOON DILATION N/A 09/21/2018   Procedure: BALLOON DILATION;  Surgeon: Irene Shipper, MD;  Location: [facility];  Service: Endoscopy;  Laterality: N/A;  . BLADDER SUSPENSION  1980's  . BOTOX INJECTION N/A 09/21/2018   Procedure: BOTOX INJECTION;  Surgeon: Irene Shipper, MD;  Location: [facility];  Service: Endoscopy;  Laterality: N/A;  . CATARACT EXTRACTION W/ INTRAOCULAR LENS  IMPLANT, BILATERAL Bilateral 2010  . DILATION AND CURETTAGE OF UTERUS  1961  . ESOPHAGEAL MANOMETRY N/A 03/25/2018   Procedure: ESOPHAGEAL MANOMETRY (EM);  Surgeon: Mauri Pole, MD;  Location: [facility];  Service: Endoscopy;  Laterality: N/A;  . ESOPHAGOGASTRODUODENOSCOPY (EGD) WITH PROPOFOL N/A 04/07/2018   Procedure: ESOPHAGOGASTRODUODENOSCOPY (EGD) WITH PROPOFOL;  Surgeon: Doran Stabler, MD;  Location: [facility];  Service: Gastroenterology;  Laterality: N/A;  . ESOPHAGOGASTRODUODENOSCOPY (EGD) WITH PROPOFOL  N/A 09/21/2018   Procedure: ESOPHAGOGASTRODUODENOSCOPY (EGD) WITH PROPOFOL;  Surgeon: Irene Shipper, MD;  Location: [facility];  Service: Endoscopy;  Laterality: N/A;  . HERNIA REPAIR    . HIATAL HERNIA REPAIR N/A 08/02/2016   Procedure: LAPAROSCOPIC REPAIR OF LARGE  HIATAL HERNIA;  Surgeon: Excell Seltzer, MD;  Location: [facility];  Service: General;  Laterality: N/A;  . JOINT REPLACEMENT    . LAPAROSCOPIC NISSEN FUNDOPLICATION N/A 5/46/5681   Procedure: LAPAROSCOPIC NISSEN FUNDOPLICATION;  Surgeon: Excell Seltzer, MD;  Location: [facility];  Service: General;  Laterality: N/A;  . [place]; 1994; [id number]   "I've got 2 stainless steel rods; 6 screws; 2 ray cages"took bone from right hip to put in back  . TONSILLECTOMY AND ADENOIDECTOMY  1945  . TOTAL KNEE ARTHROPLASTY Right ~ 1996  . TUBAL LIGATION  ~ 88   Social History   Social History Narrative   Retired    Aeronautical engineer History  Administered Date(s) Administered  . Influenza Whole 09/04/2007,  08/16/2008  . Influenza, High Dose Seasonal PF 08/04/2018  . Influenza,inj,Quad PF,6+ Mos 08/17/2013, 09/02/2014  . Pneumococcal Conjugate-13 09/02/2014  . Pneumococcal Polysaccharide-23 06/01/2013  . Td 04/08/2006  . Zoster 04/08/2006     Objective: Vital Signs: BP (!) 149/73 (BP Location: Left Arm, Patient Position: Sitting, Cuff Size: Normal)   Pulse 82   Resp 18   Ht '5\' 2"'  (1.575 m)   Wt 145 lb 9.6 oz (66 kg)   BMI 26.63 kg/m    Physical Exam Vitals signs and nursing note reviewed.  Constitutional:      Appearance: She is well-developed.  HENT:     Head: Normocephalic and atraumatic.  Eyes:     Conjunctiva/sclera: Conjunctivae normal.  Neck:     Musculoskeletal: Normal range of motion.  Cardiovascular:     Rate and Rhythm: Normal rate and regular rhythm.     Heart sounds: Normal heart sounds.  Pulmonary:     Effort: Pulmonary effort is normal.     Breath sounds: Normal breath sounds.   Abdominal:     General: Bowel sounds are normal.     Palpations: Abdomen is soft.  Lymphadenopathy:     Cervical: No cervical adenopathy.  Skin:    General: Skin is warm and dry.     Capillary Refill: Capillary refill takes less than 2 seconds.  Neurological:     Mental Status: She is alert and oriented to person, place, and time.  Psychiatric:        Behavior: Behavior normal.      Musculoskeletal Exam: C-spine limited ROM with discomfort.  Thoracic kyphosis.  Thoracolumbar scoliosis noted.  Midline spinal tenderness in thoracic and lumbar spine.  Shoulder joints have good ROM.  Elbow joints have good ROM with no tenderness or inflammation.  Limited ROM of both wrist joints.  Severe CMC joint, PIP and DIP synovial thickening. No synovitis noted.  Hip joints have limited ROM with no discomfort.  Right knee replacement has limited extension.  Left knee has good ROM with no warmth or effusion.  Ankle joint tenderness bilaterally.  Pedal edema noted.  She has PIP and DIP synovial thickening consistent with osteoarthritis of both feet.   CDAI Exam: CDAI Score: - Patient Global: -; Provider Global: - Swollen: -; Tender: - Joint Exam   No joint exam has been documented for this visit   There is currently no information documented on the homunculus. Go to the Rheumatology activity and complete the homunculus joint exam.  Investigation: No additional findings.  Imaging: Ct Chest High Resolution  Result Date: 06/01/2019 CLINICAL DATA:  83 year old female with history of recent pneumonia in 2019 and January 2020. Persistent shortness of breath and dry cough. EXAM: CT CHEST WITHOUT CONTRAST TECHNIQUE: Multidetector CT imaging of the chest was performed following the standard protocol without intravenous contrast. High resolution imaging of the lungs, as well as inspiratory and expiratory imaging, was performed. COMPARISON:  Chest CT 02/04/2019. FINDINGS: Cardiovascular: Heart size is normal.  There is no significant pericardial fluid, thickening or pericardial calcification. There is aortic atherosclerosis, as well as atherosclerosis of the great vessels of the mediastinum and the coronary arteries, including calcified atherosclerotic plaque in the left anterior descending and right coronary arteries. Calcifications of the mitral annulus. Mediastinum/Nodes: Multiple prominent but nonenlarged mediastinal and hilar lymph nodes are noted, nonspecific, but similar to the prior study. No definite pathologically enlarged mediastinal lymph nodes. Moderate-sized hiatal hernia. No axillary lymphadenopathy. Lungs/Pleura: Multiple small pulmonary nodules are noted throughout  the lungs bilaterally, many of which are similar in size and number to the prior study. There are some new nodules, largest of which is in the left upper lobe (axial image 54 of series 3) measuring 7 x 5 mm (mean diameter of 6 mm). Increasing patchy peribronchovascular ground-glass attenuation and ground-glass attenuation micro nodularity in the lungs, most evident in the left upper lobe. Mild diffuse bronchial wall thickening. High-resolution images demonstrate scattered areas of mild septal thickening and minimal subpleural reticulation randomly distributed in the lungs bilaterally, without definitive craniocaudal gradient. No traction bronchiectasis or honeycombing. Inspiratory and expiratory imaging demonstrates mild air trapping indicative of small airways disease. Upper Abdomen: Aortic atherosclerosis. Musculoskeletal: There are no aggressive appearing lytic or blastic lesions noted in the visualized portions of the skeleton. Orthopedic fixation hardware in the lumbar spine incompletely imaged. IMPRESSION: 1. There is a spectrum of findings which is favored to reflect an evolving atypical infection rather than interstitial lung disease. Clinical correlation for signs and symptoms of mycobacterium avium intracellulare (MAI) or other  chronic atypical infection is recommended. If there is persistent clinical concern for interstitial lung disease, further evaluation with repeat high-resolution chest CT is recommended in 12 months. 2. Small pulmonary nodules in the lungs bilaterally, generally stable compared to the prior examination with a few exceptions, as above. Attention at time of repeat high-resolution chest CT is recommended in 12 months to ensure stability or resolution. 3. Mild air trapping indicative of small airways disease. 4. Aortic atherosclerosis, in addition to 2 vessel coronary artery disease. 5. There are calcifications of the mitral annulus. Echocardiographic correlation for evaluation of potential valvular dysfunction may be warranted if clinically indicated. Aortic Atherosclerosis (ICD10-I70.0). Electronically Signed   By: Vinnie Langton M.D.   On: 06/01/2019 14:07    Recent Labs: Lab Results  Component Value Date   WBC 7.2 03/30/2019   HGB 10.8 (L) 03/30/2019   PLT 295.0 03/30/2019   NA 138 03/30/2019   K 4.2 03/30/2019   CL 100 03/30/2019   CO2 31 03/30/2019   GLUCOSE 94 03/30/2019   BUN 20 03/30/2019   CREATININE 0.94 03/30/2019   BILITOT 0.6 03/09/2019   ALKPHOS 65 03/09/2019   AST 18 03/09/2019   ALT 14 03/09/2019   PROT 7.1 03/09/2019   ALBUMIN 4.0 03/09/2019   CALCIUM 9.1 03/30/2019   GFRAA 48 (L) 03/09/2019  05/03/19 ESR 11, RF-, anti-CCP-, Uric acid 6.5  Speciality Comments: No specialty comments available.  Procedures:  No procedures performed Allergies: Morphine and Tape   Assessment / Plan:     Visit Diagnoses: Primary osteoarthritis of both hands - bilateral severe: X-rays in both hands and recent lab work were reviewed with the patient today.  RF was negative, anti-CCP negative, and sed rate was within normal limits on 03/30/2019.  She continues to have severe pain in bilateral hands.  She has severe PIP and DIP synovial thickening and bilateral CMC joint synovial thickening.  No  synovitis is noted on exam today.  Joint protection and muscle strengthening were discussed.  She is given a handout of hand exercises to perform.  She takes tramadol as needed for pain relief.  She also uses Aspercreme topically as needed.  She was advised to notify us if she has increased joint swelling.  She will follow-up on an as-needed basis.  S/P total knee replacement, right - Chronic pain.  She has limited extension with discomfort.  No warmth or effusion was noted.   Primary osteoarthritis of  left knee - severe -x-rays from 05/03/2019 were reviewed with the patient today.  She has chronic pain in the left knee joint.  No warmth or effusion was noted.  She does not want to proceed with a knee replacement at this time.  She applies Aspercreme topically as needed for pain relief.  DDD (degenerative disc disease), thoracic - kyphosis, scoliosis -Thoracic kyphosis and scoliosis noted.  She has chronic pain and midline spinal tenderness.  She has tried wearing a back brace in the past which was ineffective.  Will refer her to physical therapy.  She was also given a handout of back exercises to perform.  She will continue taking tramadol as needed for pain relief  DDD (degenerative disc disease), lumbar - She has midline spinal tenderness on exam today.  She has limited range of motion with discomfort.  She has difficulty laying flat.  We will refer to physical therapy. She was also given a handout of back exercises to perform. She will continue taking tramadol as needed for pain  History of osteopenia - She has an upcoming DEXA scheduled for 07/30/2019.   Essential hypertension  Hemispheric carotid artery syndrome  Esophageal dysmotility   Esophageal stricture   History of gastroesophageal reflux (GERD)   Dyslipidemia   History of anxiety   Orders: No orders of the defined types were placed in this encounter.  No orders of the defined types were placed in this encounter.    Face-to-face time spent with patient was 30 minutes. Greater than 50% of time was spent in counseling and coordination of care.  Follow-Up Instructions: Return if symptoms worsen or fail to improve, for Osteoarthritis, DDD.   Ofilia Neas, PA-C   I examined and evaluated the patient with Julie Sams PA.  Patient had no synovitis on examination.  She has severe osteoarthritis involving multiple joints as described above.  Joint protection muscle strengthening was discussed at length.  She is also concerned about the kyphosis.  I have demonstrated some of the exercises in the office.  We will also refer her to physical therapy for fall prevention, lower extremity muscle strengthening and osteopenia.  The plan of care was discussed as noted above.  Bo Merino, MD Note - This record has been created using Editor, commissioning.  Chart creation errors have been sought, but may not always  have been located. Such creation errors do not reflect on  the standard of medical care.

## 2019-06-01 ENCOUNTER — Ambulatory Visit (INDEPENDENT_AMBULATORY_CARE_PROVIDER_SITE_OTHER)
Admission: RE | Admit: 2019-06-01 | Discharge: 2019-06-01 | Disposition: A | Discharge status: Home / Self Care | Payer: Medicare Other | Source: Ambulatory Visit | Attending: Adult Health | Admitting: Adult Health

## 2019-06-01 ENCOUNTER — Other Ambulatory Visit: Payer: Self-pay

## 2019-06-01 DIAGNOSIS — R0602 Shortness of breath: Secondary | ICD-10-CM | POA: Diagnosis not present

## 2019-06-01 DIAGNOSIS — R911 Solitary pulmonary nodule: Secondary | ICD-10-CM

## 2019-06-03 ENCOUNTER — Ambulatory Visit: Payer: Medicare Other | Admitting: Adult Health

## 2019-06-07 ENCOUNTER — Ambulatory Visit: Payer: Medicare Other | Admitting: Rheumatology

## 2019-06-07 ENCOUNTER — Ambulatory Visit: Payer: Medicare Other | Admitting: Adult Health

## 2019-06-07 ENCOUNTER — Encounter: Payer: Self-pay | Admitting: Adult Health

## 2019-06-07 ENCOUNTER — Other Ambulatory Visit: Payer: Self-pay

## 2019-06-07 ENCOUNTER — Encounter: Payer: Self-pay | Admitting: Rheumatology

## 2019-06-07 VITALS — BP 149/73 | HR 82 | Resp 18 | Ht 62.0 in | Wt 145.6 lb

## 2019-06-07 VITALS — BP 120/80 | HR 82 | Temp 98.3°F | Ht 62.0 in | Wt 145.6 lb

## 2019-06-07 DIAGNOSIS — Z8659 Personal history of other mental and behavioral disorders: Secondary | ICD-10-CM

## 2019-06-07 DIAGNOSIS — M19041 Primary osteoarthritis, right hand: Secondary | ICD-10-CM

## 2019-06-07 DIAGNOSIS — M5136 Other intervertebral disc degeneration, lumbar region: Secondary | ICD-10-CM

## 2019-06-07 DIAGNOSIS — M1712 Unilateral primary osteoarthritis, left knee: Secondary | ICD-10-CM | POA: Diagnosis not present

## 2019-06-07 DIAGNOSIS — K224 Dyskinesia of esophagus: Secondary | ICD-10-CM | POA: Diagnosis not present

## 2019-06-07 DIAGNOSIS — I10 Essential (primary) hypertension: Secondary | ICD-10-CM

## 2019-06-07 DIAGNOSIS — G451 Carotid artery syndrome (hemispheric): Secondary | ICD-10-CM

## 2019-06-07 DIAGNOSIS — R9389 Abnormal findings on diagnostic imaging of other specified body structures: Secondary | ICD-10-CM

## 2019-06-07 DIAGNOSIS — K222 Esophageal obstruction: Secondary | ICD-10-CM

## 2019-06-07 DIAGNOSIS — Z96651 Presence of right artificial knee joint: Secondary | ICD-10-CM

## 2019-06-07 DIAGNOSIS — R29898 Other symptoms and signs involving the musculoskeletal system: Secondary | ICD-10-CM

## 2019-06-07 DIAGNOSIS — M5134 Other intervertebral disc degeneration, thoracic region: Secondary | ICD-10-CM

## 2019-06-07 DIAGNOSIS — E785 Hyperlipidemia, unspecified: Secondary | ICD-10-CM

## 2019-06-07 DIAGNOSIS — M6281 Muscle weakness (generalized): Secondary | ICD-10-CM

## 2019-06-07 DIAGNOSIS — Z8719 Personal history of other diseases of the digestive system: Secondary | ICD-10-CM

## 2019-06-07 DIAGNOSIS — M19042 Primary osteoarthritis, left hand: Secondary | ICD-10-CM

## 2019-06-07 DIAGNOSIS — Z8739 Personal history of other diseases of the musculoskeletal system and connective tissue: Secondary | ICD-10-CM

## 2019-06-07 DIAGNOSIS — J4530 Mild persistent asthma, uncomplicated: Secondary | ICD-10-CM

## 2019-06-07 NOTE — Assessment & Plan Note (Signed)
Abnormal CT chest -prone to previous pneumonia felt to possibly be underlying aspiration pneumonia.  She has had ongoing micro-nodularity noted on CT chest.  Most recent CT chest on July 28 showed stable pulmonary nodules with new small nodules noted with maximum at 7 mm.  Findings were concerning for possible underlying atypical infection such as MAI.  Patient cough is mainly dry.  We will give her sputum cups for sputum culture and sputum AFB.  If symptoms persist and if she is requiring frequent antibiotics along with weight loss and worsening respiratory symptoms we will consider possible bronchoscopy with cultures For now patient does appear stable most recent PFTs were stable with DLCO at 76%.  No significant airflow obstruction or restriction. We will repeat CT chest in 1 year to follow nodules and or sooner if needed.  Plan  Patient Instructions  Sputum culture and Sputum AFB  Aspiration preacutions as discussed.  Protonix 40mg  Twice daily  Before meal .  Pepcid 20mg  At bedtime   GERD diet . No eating 3 hrs before bedtime .  Albuterol Inhaler 1-2 puffs every 6hr as needed for wheezing/shortness of breath  CT chest in 1 year  Follow up in 3 months with Dr. Melvyn Novas  Or Allisa Einspahr NP and As needed   Please contact office for sooner follow up if symptoms do not improve or worsen or seek emergency care

## 2019-06-07 NOTE — Patient Instructions (Signed)
Sputum culture and Sputum AFB  Aspiration preacutions as discussed.  Protonix 40mg  Twice daily  Before meal .  Pepcid 20mg  At bedtime   GERD diet . No eating 3 hrs before bedtime .  Albuterol Inhaler 1-2 puffs every 6hr as needed for wheezing/shortness of breath  CT chest in 1 year  Follow up in 3 months with Dr. Melvyn Novas  Or [name] NP and As needed   Please contact office for sooner follow up if symptoms do not improve or worsen or seek emergency care

## 2019-06-07 NOTE — Patient Instructions (Signed)
Back Exercises These exercises help to make your trunk and back strong. They also help to keep the lower back flexible. Doing these exercises can help to prevent back pain or lessen existing pain.  If you have back pain, try to do these exercises 2-3 times each day or as told by your doctor.  As you get better, do the exercises once each day. Repeat the exercises more often as told by your doctor.  To stop back pain from coming back, do the exercises once each day, or as told by your doctor. Exercises Single knee to chest Do these steps 3-5 times in a row for each leg: 1. Lie on your back on a firm bed or the floor with your legs stretched out. 2. Bring one knee to your chest. 3. Grab your knee or thigh with both hands and hold them it in place. 4. Pull on your knee until you feel a gentle stretch in your lower back or buttocks. 5. Keep doing the stretch for 10-30 seconds. 6. Slowly let go of your leg and straighten it. Pelvic tilt Do these steps 5-10 times in a row: 1. Lie on your back on a firm bed or the floor with your legs stretched out. 2. Bend your knees so they point up to the ceiling. Your feet should be flat on the floor. 3. Tighten your lower belly (abdomen) muscles to press your lower back against the floor. This will make your tailbone point up to the ceiling instead of pointing down to your feet or the floor. 4. Stay in this position for 5-10 seconds while you gently tighten your muscles and breathe evenly. Cat-cow Do these steps until your lower back bends more easily: 1. Get on your hands and knees on a firm surface. Keep your hands under your shoulders, and keep your knees under your hips. You may put padding under your knees. 2. Let your head hang down toward your chest. Tighten (contract) the muscles in your belly. Point your tailbone toward the floor so your lower back becomes rounded like the back of a cat. 3. Stay in this position for 5 seconds. 4. Slowly lift your  head. Let the muscles of your belly relax. Point your tailbone up toward the ceiling so your back forms a sagging arch like the back of a cow. 5. Stay in this position for 5 seconds.  Press-ups Do these steps 5-10 times in a row: 1. Lie on your belly (face-down) on the floor. 2. Place your hands near your head, about shoulder-width apart. 3. While you keep your back relaxed and keep your hips on the floor, slowly straighten your arms to raise the top half of your body and lift your shoulders. Do not use your back muscles. You may change where you place your hands in order to make yourself more comfortable. 4. Stay in this position for 5 seconds. 5. Slowly return to lying flat on the floor.  Bridges Do these steps 10 times in a row: 1. Lie on your back on a firm surface. 2. Bend your knees so they point up to the ceiling. Your feet should be flat on the floor. Your arms should be flat at your sides, next to your body. 3. Tighten your butt muscles and lift your butt off the floor until your waist is almost as high as your knees. If you do not feel the muscles working in your butt and the back of your thighs, slide your feet 1-2 inches   farther away from your butt. 4. Stay in this position for 3-5 seconds. 5. Slowly lower your butt to the floor, and let your butt muscles relax. If this exercise is too easy, try doing it with your arms crossed over your chest. Belly crunches Do these steps 5-10 times in a row: 1. Lie on your back on a firm bed or the floor with your legs stretched out. 2. Bend your knees so they point up to the ceiling. Your feet should be flat on the floor. 3. Cross your arms over your chest. 4. Tip your chin a little bit toward your chest but do not bend your neck. 5. Tighten your belly muscles and slowly raise your chest just enough to lift your shoulder blades a tiny bit off of the floor. Avoid raising your body higher than that, because it can put too much stress on your low  back. 6. Slowly lower your chest and your head to the floor. Back lifts Do these steps 5-10 times in a row: 1. Lie on your belly (face-down) with your arms at your sides, and rest your forehead on the floor. 2. Tighten the muscles in your legs and your butt. 3. Slowly lift your chest off of the floor while you keep your hips on the floor. Keep the back of your head in line with the curve in your back. Look at the floor while you do this. 4. Stay in this position for 3-5 seconds. 5. Slowly lower your chest and your face to the floor. Contact a doctor if:  Your back pain gets a lot worse when you do an exercise.  Your back pain does not get better 2 hours after you exercise. If you have any of these problems, stop doing the exercises. Do not do them again unless your doctor says it is okay. Get help right away if:  You have sudden, very bad back pain. If this happens, stop doing the exercises. Do not do them again unless your doctor says it is okay. This information is not intended to replace advice given to you by your health care provider. Make sure you discuss any questions you have with your health care provider. Document Released: 11/23/2010 Document Revised: 07/16/2018 Document Reviewed: 07/16/2018 [employer] Patient Education  2020 [employer]  Hand Exercises Hand exercises can be helpful for almost anyone. These exercises can strengthen the hands, improve flexibility and movement, and increase blood flow to the hands. These results can make work and daily tasks easier. Hand exercises can be especially helpful for people who have joint pain from arthritis or have nerve damage from overuse (carpal tunnel syndrome). These exercises can also help people who have injured a hand. Exercises Most of these hand exercises are gentle stretching and motion exercises. It is usually safe to do them often throughout the day. Warming up your hands before exercise may help to reduce stiffness. You can  do this with gentle massage or by placing your hands in warm water for 10-15 minutes. It is normal to feel some stretching, pulling, tightness, or mild discomfort as you begin new exercises. This will gradually improve. Stop an exercise right away if you feel sudden, severe pain or your pain gets worse. Ask your health care provider which exercises are best for you. Knuckle bend or "claw" fist 1. Stand or sit with your arm, hand, and all five fingers pointed straight up. Make sure to keep your wrist straight during the exercise. 2. Gently bend your fingers   down toward your palm until the tips of your fingers are touching the top of your palm. Keep your big knuckle straight and just bend the small knuckles in your fingers. 3. Hold this position for __________ seconds. 4. Straighten (extend) your fingers back to the starting position. Repeat this exercise 5-10 times with each hand. Full finger fist 7. Stand or sit with your arm, hand, and all five fingers pointed straight up. Make sure to keep your wrist straight during the exercise. 8. Gently bend your fingers into your palm until the tips of your fingers are touching the middle of your palm. 9. Hold this position for __________ seconds. 10. Extend your fingers back to the starting position, stretching every joint fully. Repeat this exercise 5-10 times with each hand. Straight fist 5. Stand or sit with your arm, hand, and all five fingers pointed straight up. Make sure to keep your wrist straight during the exercise. 6. Gently bend your fingers at the big knuckle, where your fingers meet your hand, and the middle knuckle. Keep the knuckle at the tips of your fingers straight and try to touch the bottom of your palm. 7. Hold this position for __________ seconds. 8. Extend your fingers back to the starting position, stretching every joint fully. Repeat this exercise 5-10 times with each hand. Tabletop 6. Stand or sit with your arm, hand, and all  five fingers pointed straight up. Make sure to keep your wrist straight during the exercise. 7. Gently bend your fingers at the big knuckle, where your fingers meet your hand, as far down as you can while keeping the small knuckles in your fingers straight. Think of forming a tabletop with your fingers. 8. Hold this position for __________ seconds. 9. Extend your fingers back to the starting position, stretching every joint fully. Repeat this exercise 5-10 times with each hand. Finger spread 6. Place your hand flat on a table with your palm facing down. Make sure your wrist stays straight as you do this exercise. 7. Spread your fingers and thumb apart from each other as far as you can until you feel a gentle stretch. Hold this position for __________ seconds. 8. Bring your fingers and thumb tight together again. Hold this position for __________ seconds. Repeat this exercise 5-10 times with each hand. Making circles 6. Stand or sit with your arm, hand, and all five fingers pointed straight up. Make sure to keep your wrist straight during the exercise. 7. Make a circle by touching the tip of your thumb to the tip of your index finger. 8. Hold for __________ seconds. Then open your hand wide. 9. Repeat this motion with your thumb and each finger on your hand. Repeat this exercise 5-10 times with each hand. Thumb motion 7. Sit with your forearm resting on a table and your wrist straight. Your thumb should be facing up toward the ceiling. Keep your fingers relaxed as you move your thumb. 8. Lift your thumb up as high as you can toward the ceiling. Hold for __________ seconds. 9. Bend your thumb across your palm as far as you can, reaching the tip of your thumb for the small finger (pinkie) side of your palm. Hold for __________ seconds. Repeat this exercise 5-10 times with each hand. Grip strengthening  6. Hold a stress ball or other soft ball in the middle of your hand. 7. Slowly increase the  pressure, squeezing the ball as much as you can without causing pain. Think of bringing the tips   of your fingers into the middle of your palm. All of your finger joints should bend when doing this exercise. 8. Hold your squeeze for __________ seconds, then relax. Repeat this exercise 5-10 times with each hand. Contact a health care provider if:  Your hand pain or discomfort gets much worse when you do an exercise.  Your hand pain or discomfort does not improve within 2 hours after you exercise. If you have any of these problems, stop doing these exercises right away. Do not do them again unless your health care provider says that you can. Get help right away if:  You develop sudden, severe hand pain or swelling. If this happens, stop doing these exercises right away. Do not do them again unless your health care provider says that you can. This information is not intended to replace advice given to you by your health care provider. Make sure you discuss any questions you have with your health care provider. Document Released: 10/02/2015 Document Revised: 02/11/2019 Document Reviewed: 10/22/2018 [employer] Patient Education  2020 [employer]  

## 2019-06-07 NOTE — Assessment & Plan Note (Signed)
Esophageal dysmotility disorder, dysphagia, GERD, She is at risk for aspiration.  Suspect that the micro-nodularity on CT may be representative of microaspiration. Patient is to continue with aspiration precautions, GERD treatment and follow-up with GI.

## 2019-06-07 NOTE — Progress Notes (Signed)
'@Patient'  ID: Julie Rice, female    DOB: Mar 16, 1936, 83 y.o.   MRN: [id number]  Chief Complaint  Patient presents with   Follow-up    Cough     Referring provider: [name], NP  HPI: 83 year old female never smoker seen for initial pulmonary consult December 31, 2017 for cough and shortness of breath for several years. She has a history of recurrent pneumonia. Medical history significant for esophageal dysmotility, GERD, large symptomatic hiatal hernia status post Nissen fundoplication in September 2017. Esophageal stricture status post dilatation April 2018, upper endoscopy with Botox injection and balloon dilatation of the esophagus in November 2019.  TEST/EVENTS :  Previous CT chest in February 2019 she had a ncecrotic mass vs consolidation with necrosis along the right upper lobe measuring max 6.5cm. Serial Chest xray showed resoultion with residual scarring .  CT chest February 04, 2019- for PE, diffuse groundglass nodules and mild tree-in-bud nodularity,increased diffuse distention of the thoracic esophagus containing a large amount of fluid andair  Pulmonary function testing 04/2019  showed FEV1 at 87%, ratio 78, FVC 83%.  Positive mid flow reversibility.  DLCO 76%  06/07/2019 Follow up : Cough, Lung nodules  Patient returns for a 6-week follow-up.  Patient is being followed for pneumonia she had ongoing cough and shortness of breath.  Chest x-ray had shown a right upper lobe small opacity.  A subsequent CT chest April 2020 showed diffuse groundglass nodules with some areas of mild tree-in-bud nodularity.  Large volume of fluid distended in the esophagus.  Suspicion for possible aspiration component. Follow-up high resolution CT chest was done June 01, 2019 that showed multiple small pulmonary nodules throughout the lungs bilaterally stable in size however there was some new nodules largest measuring 7 x 5 mm.  Increased patchy groundglass attenuation and groundglass  micro-nodularity increase in the left upper lobe.  Mild septal thickening and minimal subpleural reticulation.  No traction bronchiectasis or honeycombing.  Findings were concerning for an atypical infection rather than ILD possible MAI or other chronic atypical infection.  We discussed her CT results.  Incidental findings showed calcified atherosclerotic plaque in the LAD and right coronary arteries calcifications of the mitral annulus . recommend a follow-up CT chest in 1 year and follow-up with primary care for coronary findings. Patient was referred to rheumatology last visit.  Diagnosed with severe osteoarthritis of bilateral hands and left knee along with degenerative disc disease.  Lab work was negative for rheumatoid factor, ESR 11 uric acid normal CCP antibody negative Started on Symbicort , was unable to tolerate . Made her very shaky. Also was way too expensive . Has intermittent dry cough . Gets winded with activity . No chest pain or increased edema.   Followed by GI . Has GERD , dysphagia and esophageal dysmotility .  On PPI Twice daily  And Carafate Four times a day  .  Still has ongoing dysphagia and intermittent reflux, Does wake up with bad taste in mouth .   Allergies  Allergen Reactions   Morphine Other (See Comments)    Flushing, rash, itching   Tape Other (See Comments)    Band aides, adhesive tape Redness and pulls skin off    Immunization History  Administered Date(s) Administered   Influenza Whole 09/04/2007, 08/16/2008   Influenza, High Dose Seasonal PF 08/04/2018   Influenza,inj,Quad PF,6+ Mos 08/17/2013, 09/02/2014   Pneumococcal Conjugate-13 09/02/2014   Pneumococcal Polysaccharide-23 06/01/2013   Td 04/08/2006   Zoster 04/08/2006  Past Medical History:  Diagnosis Date   Anemia    Anxiety    Bipolar disorder ([place])    Blood transfusion 1991   autologous pts own blood given    CAD (coronary artery disease)    Cataract    Chest pain     "@ rest, lying down, w/exertion"   Chronic back pain    "mostly lower back but I do have upper back pain regularly" (04/06/2018)   COPD (chronic obstructive pulmonary disease) (HCC)    Depression    Esophageal stricture    Fecal occult blood test positive    Fibromyalgia    GERD (gastroesophageal reflux disease)    Heart murmur    "slight" (04/06/2018)   Hiatal hernia    Hyperlipidemia    Patient denies   Hypertension    Internal hemorrhoids    Lumbago    Mitral regurgitation    Osteoarthritis    Osteoporosis    Pneumonia ~ 01/2018   PONV (postoperative nausea and vomiting)    severe ponv, "in the past" (04/06/2018)   Rectal bleeding    Rheumatoid arthritis ([place])    Spondylosis    TIA (transient ischemic attack) 2013   Urinary incontinence    wears depends    Tobacco History: Social History   Tobacco Use  Smoking Status Never Smoker  Smokeless Tobacco Never Used   Counseling given: Not Answered   Outpatient Medications Prior to Visit  Medication Sig Dispense Refill   acetaminophen (TYLENOL) 500 MG tablet Take 1,000 mg by mouth 2 (two) times daily as needed for moderate pain (arthritis).      albuterol (VENTOLIN HFA) 108 (90 Base) MCG/ACT inhaler Inhale 2 puffs into the lungs every 6 (six) hours as needed for wheezing or shortness of breath. 1 Inhaler 6   aspirin 81 MG tablet Take 1 tablet (81 mg total) by mouth daily. For cardiovascular health.     carvedilol (COREG) 3.125 MG tablet Take 1 tablet (3.125 mg total) by mouth 2 (two) times daily with a meal. 60 tablet 3   clonazePAM (KLONOPIN) 0.5 MG tablet TAKE 1 TABLET BY MOUTH TWICE A DAY AS NEEDED FOR ANXIETY 60 tablet 2   gabapentin (NEURONTIN) 100 MG capsule TAKE 1 CAPSULE BY MOUTH THREE TIMES A DAY (Patient taking differently: Take 100 mg by mouth 3 (three) times daily. TAKE 1 CAPSULE BY MOUTH THREE TIMES A DAY) 270 capsule 1   losartan-hydrochlorothiazide (HYZAAR) 100-25 MG tablet Take 1  tablet by mouth daily. 90 tablet 3   meloxicam (MOBIC) 15 MG tablet Take 1 tablet (15 mg total) by mouth daily. 90 tablet 3   Multiple Vitamin (MULTIVITAMIN) tablet Take 1 tablet by mouth daily. Vitamin supplement.     ondansetron (ZOFRAN) 4 MG tablet Take one tablet every 4-6 hours as needed for nausea (Patient taking differently: Take 4 mg by mouth every 4 (four) hours as needed for nausea or vomiting. ) 60 tablet 3   pantoprazole (PROTONIX) 40 MG tablet Take 1 tablet (40 mg total) by mouth 2 (two) times daily. 60 tablet 3   sertraline (ZOLOFT) 100 MG tablet TAKE 1 TABLET BY MOUTH EVERYDAY AT BEDTIME 90 tablet 0   sucralfate (CARAFATE) 1 g tablet Take 1 tablet (1 g total) by mouth 4 (four) times daily. Dissolve tablet in 10 ml warm water (Patient taking differently: Take 1 g by mouth 4 (four) times daily as needed (heartburn). Dissolve tablet in 10 ml warm water) 56 tablet 1  tetrahydrozoline 0.05 % ophthalmic solution Place 1 drop into both eyes daily.     traMADol (ULTRAM) 50 MG tablet Take 1 tablet (50 mg total) by mouth every 8 (eight) hours as needed. 90 tablet 0   No facility-administered medications prior to visit.      Review of Systems:   Constitutional:   No  weight loss, night sweats,  Fevers, chills,  +fatigue, or  lassitude.  HEENT:   No headaches,  Difficulty swallowing,  Tooth/dental problems, or  Sore throat,                No sneezing, itching, ear ache,  +nasal congestion, post nasal drip,   CV:  No chest pain,  Orthopnea, PND, swelling in lower extremities, anasarca, dizziness, palpitations, syncope.   GI  No heartburn, indigestion, abdominal pain, nausea, vomiting, diarrhea, change in bowel habits, loss of appetite, bloody stools.   Resp:  .  No chest wall deformity  Skin: no rash or lesions.  GU: no dysuria, change in color of urine, no urgency or frequency.  No flank pain, no hematuria   MS:  + joint pains     Physical Exam  BP 120/80    Pulse 82     Temp 98.3 F (36.8 C) (Oral)    Ht '5\' 2"'  (1.575 m)    Wt 145 lb 9.6 oz (66 kg)    SpO2 96%    BMI 26.63 kg/m   GEN: A/Ox3; pleasant , NAD, elderly    HEENT:  [place]/AT,  EACs-clear, TMs-wnl, NOSE-clear, THROAT-clear, no lesions, no postnasal drip or exudate noted.   NECK:  Supple w/ fair ROM; no JVD; normal carotid impulses w/o bruits; no thyromegaly or nodules palpated; no lymphadenopathy.    RESP  Clear  P & A; w/o, wheezes/ rales/ or rhonchi. no accessory muscle use, no dullness to percussion  CARD:  RRR, no m/r/g, tr peripheral edema, pulses intact, no cyanosis or clubbing.  GI:   Soft & nt; nml bowel sounds; no organomegaly or masses detected.   Musco: Warm bil,   Arthritic changes in hands   Neuro: alert, no focal deficits noted.    Skin: Warm, no lesions or rashes    Lab Results:  CBC  BNP No results found for: BNP  ProBNP Imaging: Ct Chest High Resolution  Result Date: 06/01/2019 CLINICAL DATA:  82 year old female with history of recent pneumonia in 2019 and January 2020. Persistent shortness of breath and dry cough. EXAM: CT CHEST WITHOUT CONTRAST TECHNIQUE: Multidetector CT imaging of the chest was performed following the standard protocol without intravenous contrast. High resolution imaging of the lungs, as well as inspiratory and expiratory imaging, was performed. COMPARISON:  Chest CT 02/04/2019. FINDINGS: Cardiovascular: Heart size is normal. There is no significant pericardial fluid, thickening or pericardial calcification. There is aortic atherosclerosis, as well as atherosclerosis of the great vessels of the mediastinum and the coronary arteries, including calcified atherosclerotic plaque in the left anterior descending and right coronary arteries. Calcifications of the mitral annulus. Mediastinum/Nodes: Multiple prominent but nonenlarged mediastinal and hilar lymph nodes are noted, nonspecific, but similar to the prior study. No definite pathologically enlarged  mediastinal lymph nodes. Moderate-sized hiatal hernia. No axillary lymphadenopathy. Lungs/Pleura: Multiple small pulmonary nodules are noted throughout the lungs bilaterally, many of which are similar in size and number to the prior study. There are some new nodules, largest of which is in the left upper lobe (axial image 54 of series 3) measuring 7 x  5 mm (mean diameter of 6 mm). Increasing patchy peribronchovascular ground-glass attenuation and ground-glass attenuation micro nodularity in the lungs, most evident in the left upper lobe. Mild diffuse bronchial wall thickening. High-resolution images demonstrate scattered areas of mild septal thickening and minimal subpleural reticulation randomly distributed in the lungs bilaterally, without definitive craniocaudal gradient. No traction bronchiectasis or honeycombing. Inspiratory and expiratory imaging demonstrates mild air trapping indicative of small airways disease. Upper Abdomen: Aortic atherosclerosis. Musculoskeletal: There are no aggressive appearing lytic or blastic lesions noted in the visualized portions of the skeleton. Orthopedic fixation hardware in the lumbar spine incompletely imaged. IMPRESSION: 1. There is a spectrum of findings which is favored to reflect an evolving atypical infection rather than interstitial lung disease. Clinical correlation for signs and symptoms of mycobacterium avium intracellulare (MAI) or other chronic atypical infection is recommended. If there is persistent clinical concern for interstitial lung disease, further evaluation with repeat high-resolution chest CT is recommended in 12 months. 2. Small pulmonary nodules in the lungs bilaterally, generally stable compared to the prior examination with a few exceptions, as above. Attention at time of repeat high-resolution chest CT is recommended in 12 months to ensure stability or resolution. 3. Mild air trapping indicative of small airways disease. 4. Aortic atherosclerosis, in  addition to 2 vessel coronary artery disease. 5. There are calcifications of the mitral annulus. Echocardiographic correlation for evaluation of potential valvular dysfunction may be warranted if clinically indicated. Aortic Atherosclerosis (ICD10-I70.0). Electronically Signed   By: Vinnie Langton M.D.   On: 06/01/2019 14:07      PFT Results Latest Ref Rng & Units 04/22/2019  FVC-Pre L 2.06  FVC-Predicted Pre % 80  FVC-Post L 2.15  FVC-Predicted Post % 83  Pre FEV1/FVC % % 75  Post FEV1/FCV % % 78  FEV1-Pre L 1.54  FEV1-Predicted Pre % 80  FEV1-Post L 1.67  DLCO UNC% % 76  DLCO COR %Predicted % 87  TLC L 6.39  TLC % Predicted % 126  RV % Predicted % 178    No results found for: NITRICOXIDE      Assessment & Plan:   Asthma Possible underlying asthma.  No significant airflow obstruction or restriction on PFTs.  Patient is a never smoker.  Therefore doubt this is underlying COPD.  She was intolerant to Symbicort.  Will continue on albuterol as needed.  Plan  Patient Instructions  Sputum culture and Sputum AFB  Aspiration preacutions as discussed.  Protonix 31m Twice daily  Before meal .  Pepcid 258mAt bedtime   GERD diet . No eating 3 hrs before bedtime .  Albuterol Inhaler 1-2 puffs every 6hr as needed for wheezing/shortness of breath  CT chest in 1 year  Follow up in 3 months with Dr. WeMelvyn NovasOr Tiki Tucciarone NP and As needed   Please contact office for sooner follow up if symptoms do not improve or worsen or seek emergency care       Esophageal dysmotility Esophageal dysmotility disorder, dysphagia, GERD, She is at risk for aspiration.  Suspect that the micro-nodularity on CT may be representative of microaspiration. Patient is to continue with aspiration precautions, GERD treatment and follow-up with GI.  Abnormal CT of the chest Abnormal CT chest -prone to previous pneumonia felt to possibly be underlying aspiration pneumonia.  She has had ongoing micro-nodularity  noted on CT chest.  Most recent CT chest on July 28 showed stable pulmonary nodules with new small nodules noted with maximum at 7 mm.  Findings were concerning for possible underlying atypical infection such as MAI.  Patient cough is mainly dry.  We will give her sputum cups for sputum culture and sputum AFB.  If symptoms persist and if she is requiring frequent antibiotics along with weight loss and worsening respiratory symptoms we will consider possible bronchoscopy with cultures For now patient does appear stable most recent PFTs were stable with DLCO at 76%.  No significant airflow obstruction or restriction. We will repeat CT chest in 1 year to follow nodules and or sooner if needed.  Plan  Patient Instructions  Sputum culture and Sputum AFB  Aspiration preacutions as discussed.  Protonix 16m Twice daily  Before meal .  Pepcid 234mAt bedtime   GERD diet . No eating 3 hrs before bedtime .  Albuterol Inhaler 1-2 puffs every 6hr as needed for wheezing/shortness of breath  CT chest in 1 year  Follow up in 3 months with Dr. WeMelvyn NovasOr Gladyce Mcray NP and As needed   Please contact office for sooner follow up if symptoms do not improve or worsen or seek emergency care          TaRexene EdisonNP 06/07/2019

## 2019-06-07 NOTE — Assessment & Plan Note (Signed)
Possible underlying asthma.  No significant airflow obstruction or restriction on PFTs.  Patient is a never smoker.  Therefore doubt this is underlying COPD.  She was intolerant to Symbicort.  Will continue on albuterol as needed.  Plan  Patient Instructions  Sputum culture and Sputum AFB  Aspiration preacutions as discussed.  Protonix 40mg  Twice daily  Before meal .  Pepcid 20mg  At bedtime   GERD diet . No eating 3 hrs before bedtime .  Albuterol Inhaler 1-2 puffs every 6hr as needed for wheezing/shortness of breath  CT chest in 1 year  Follow up in 3 months with Dr. Melvyn Novas  Or [name] NP and As needed   Please contact office for sooner follow up if symptoms do not improve or worsen or seek emergency care

## 2019-06-08 ENCOUNTER — Other Ambulatory Visit: Payer: Self-pay | Admitting: Adult Health

## 2019-06-09 NOTE — Telephone Encounter (Signed)
Has upcoming CPE in September. Ok to refill for 6 months

## 2019-06-09 NOTE — Telephone Encounter (Signed)
Sent to the pharmacy by e-scribe. 

## 2019-06-20 NOTE — Progress Notes (Signed)
Chart and office note reviewed in detail  > agree with a/p as outlined    

## 2019-06-25 ENCOUNTER — Encounter: Payer: Self-pay | Admitting: Adult Health

## 2019-06-29 ENCOUNTER — Encounter: Payer: Self-pay | Admitting: Adult Health

## 2019-06-29 ENCOUNTER — Other Ambulatory Visit: Payer: Self-pay

## 2019-06-29 ENCOUNTER — Telehealth (INDEPENDENT_AMBULATORY_CARE_PROVIDER_SITE_OTHER): Payer: Medicare Other | Admitting: Adult Health

## 2019-06-29 DIAGNOSIS — R0602 Shortness of breath: Secondary | ICD-10-CM | POA: Diagnosis not present

## 2019-06-29 DIAGNOSIS — M8949 Other hypertrophic osteoarthropathy, multiple sites: Secondary | ICD-10-CM

## 2019-06-29 DIAGNOSIS — M159 Polyosteoarthritis, unspecified: Secondary | ICD-10-CM

## 2019-06-29 DIAGNOSIS — M150 Primary generalized (osteo)arthritis: Secondary | ICD-10-CM | POA: Diagnosis not present

## 2019-06-29 MED ORDER — TRAMADOL HCL 50 MG PO TABS: 50.0000 mg | ORAL_TABLET | Freq: Two times a day (BID) | ORAL | 1 refills | Status: AC | PRN

## 2019-06-29 NOTE — Telephone Encounter (Signed)
Left a message for a return call.  CRM created. 

## 2019-06-30 NOTE — Progress Notes (Signed)
Virtual Visit via Telephone Note  I connected with Julie Rice on 06/29/2019 at  4:00 PM EDT by telephone and verified that I am speaking with the correct person using two identifiers.   I discussed the limitations, risks, security and privacy concerns of performing an evaluation and management service by telephone and the availability of in person appointments. I also discussed with the patient that there may be a patient responsible charge related to this service. The patient expressed understanding and agreed to proceed.  Location patient: home Location provider: work or home office Participants present for the call: patient, provider Patient did not have a visit in the prior 7 days to address this/these issue(s).   History of Present Illness: 83 year old female who  has a past medical history of Anemia, Anxiety, Bipolar disorder (HCC), Blood transfusion (1991), CAD (coronary artery disease), Cataract, Chest pain, Chronic back pain, COPD (chronic obstructive pulmonary disease) (HCC), Depression, Esophageal stricture, Fecal occult blood test positive, Fibromyalgia, GERD (gastroesophageal reflux disease), Heart murmur, Hiatal hernia, Hyperlipidemia, Hypertension, Internal hemorrhoids, Lumbago, Mitral regurgitation, Osteoarthritis, Osteoporosis, Pneumonia (~ 01/2018), PONV (postoperative nausea and vomiting), Rectal bleeding, Rheumatoid arthritis (HCC), Spondylosis, TIA (transient ischemic attack) (2013), and Urinary incontinence.  She is being evaluated today for follow-up regarding osteoarthritis, most painful in bilateral knee joints.  Since the last time we spoke she has been seen by rheumatology.  She was referred to physical therapy for lower extremity muscle strengthening and fall prevention.  Does not sound as though there is much that they could do for her in the way of osteoarthritis.  In the past I had prescribed tramadol that she could use sparingly, she does report that she uses this  on occasion and does find relief with the medication so that she can perform her activities of daily living.  Unfortunately she has a hard time getting out of the house and is unable to complete outpatient physical therapy at this time.  She has checked with her insurance and she would like to have home health PT come in for exercises.  She also reports that she continues to have shortness of breath, she was evaluated by pulmonary about 2 weeks ago.  She had stable recent PFTs with DLCO at 76%.  She had no significant airflow obstruction or restriction.  She does get some relief with albuterol inhaler 1 to 2 puffs every 6 hours as needed.  She is scheduled for a follow-up appointment with Dr. Sharee Pimple in November.     Observations/Objective: Patient sounds cheerful and well on the phone. Sounds mildly short of breath on the phone.  Speech and thought processing are grossly intact. Patient reported vitals:  Assessment and Plan: 1. Primary osteoarthritis involving multiple joints - traMADol (ULTRAM) 50 MG tablet; Take 1 tablet (50 mg total) by mouth every 12 (twelve) hours as needed.  Dispense: 60 tablet; Refill: 1 - Ambulatory referral to Home Health  2. Shortness of breath - Continue with albuterol inhaler as directed - Will follow up with at CPE in two weeks or sooner if needed   Follow Up Instructions:   I did not refer this patient for an OV in the next 24 hours for this/these issue(s).  I discussed the assessment and treatment plan with the patient. The patient was provided an opportunity to ask questions and all were answered. The patient agreed with the plan and demonstrated an understanding of the instructions.   The patient was advised to call back or seek an  in-person evaluation if the symptoms worsen or if the condition fails to improve as anticipated.  I provided 25 minutes of non-face-to-face time during this encounter.   [name], NP

## 2019-07-07 ENCOUNTER — Other Ambulatory Visit: Payer: Self-pay

## 2019-07-07 ENCOUNTER — Encounter: Payer: Self-pay | Admitting: Internal Medicine

## 2019-07-07 ENCOUNTER — Ambulatory Visit: Payer: Medicare Other | Admitting: Internal Medicine

## 2019-07-07 VITALS — BP 152/84 | HR 60 | Temp 98.6°F | Ht 63.0 in | Wt 144.0 lb

## 2019-07-07 DIAGNOSIS — K220 Achalasia of cardia: Secondary | ICD-10-CM | POA: Diagnosis not present

## 2019-07-07 DIAGNOSIS — R1310 Dysphagia, unspecified: Secondary | ICD-10-CM | POA: Diagnosis not present

## 2019-07-07 DIAGNOSIS — K219 Gastro-esophageal reflux disease without esophagitis: Secondary | ICD-10-CM | POA: Diagnosis not present

## 2019-07-07 NOTE — Patient Instructions (Signed)
You have been scheduled for an endoscopy. Please follow written instructions given to you at your visit today. If you use inhalers (even only as needed), please bring them with you on the day of your procedure.  Due to recent COVID-19 restrictions implemented by our local and state authorities and in an effort to keep both patients and staff as safe as possible, our hospital system now requires COVID-19 testing prior to any scheduled hospital procedure. Please go to our [facility] location drive thru testing site ([address] [place],  [address]) on 08/13/2019, any time between 9:30am - 3:30pm (Wednesday hours are 9:30 am-12 pm and Saturday hours are 9 am-12:30 pm). There will be multiple testing areas, the first checkpoint being for pre-procedure/surgery testing. Get into the right (yellow) lane that leads to the PAT testing team. You will not be billed at the time of testing but may receive a bill later depending on your insurance. The approximate cost of the test is $100. You must agree to quarantine from the time of your testing until the procedure date on 08/17/2019 . This should include staying at home with ONLY the people you live with. Avoid take-out, grocery store shopping or leaving the house for any non-emergent reason. Please call our office at [phone] if you have any questions.

## 2019-07-07 NOTE — Progress Notes (Signed)
HISTORY OF PRESENT ILLNESS:  Julie Rice is a 83 y.o. female with GERD, history of esophageal stricture, large symptomatic hiatal hernia requiring fundoplication, post fundoplication dysphasia secondary to dysmotility (primary versus secondary with technically limited esophageal manometry).  She underwent upper endoscopy with empiric Botox injection therapy followed by balloon dilation therapy of the esophagus to 20 mm in November 2019.  She was seen in follow-up 3 months later and doing extremely well.  Approximately a month later she developed problems with the cough.  Multiple pulmonary evaluations.  Minimal esophageal problems.  She has recovered from her pulmonary issues and presents today for follow-up.  She tells me that she has intermittent problems with regurgitation.  Associated with this is a foul smell and burning.  She will take some antacids on demand.  She is on twice daily pantoprazole.  Seems to be doing fairly well with regards to her eating.  She has had a few vomiting spells.  She brings into the office today emesis from yesterday which has a coffee-ground appearance.  Review of laboratories from May 2020 finds unremarkable basic metabolic panel.  CBC shows hemoglobin 10.8.  CT scan from April 05, 2018 shows increased size of a moderate hiatal hernia.  October 2018.  She thinks that she might benefit again from repeat Botox injection and esophageal dilation.  Of note, multiple repeat test for COVID have been negative  REVIEW OF SYSTEMS:  All non-GI ROS negative unless otherwise stated in the HPI except for anxiety, arthritis, back pain, cough, depression, ankle swelling, shortness of breath  Past Medical History:  Diagnosis Date  . Anemia   . Anxiety   . Bipolar disorder ([place])   . Blood transfusion 1991   autologous pts own blood given   . CAD (coronary artery disease)   . Cataract   . Chest pain    "@ rest, lying down, w/exertion"  . Chronic back pain    "mostly lower back  but I do have upper back pain regularly" (04/06/2018)  . COPD (chronic obstructive pulmonary disease) ([place])   . Depression   . Esophageal stricture   . Fecal occult blood test positive   . Fibromyalgia   . GERD (gastroesophageal reflux disease)   . Heart murmur    "slight" (04/06/2018)  . Hiatal hernia   . Hyperlipidemia    Patient denies  . Hypertension   . Internal hemorrhoids   . Lumbago   . Mitral regurgitation   . Osteoarthritis   . Osteoporosis   . Pneumonia ~ 01/2018  . PONV (postoperative nausea and vomiting)    severe ponv, "in the past" (04/06/2018)  . Rectal bleeding   . Rheumatoid arthritis ([place])   . Spondylosis   . TIA (transient ischemic attack) 2013  . Urinary incontinence    wears depends    Past Surgical History:  Procedure Laterality Date  . ABDOMINAL HYSTERECTOMY  1982  . BACK SURGERY    . BALLOON DILATION N/A 09/21/2018   Procedure: BALLOON DILATION;  Surgeon: Irene Shipper, MD;  Location: [facility];  Service: Endoscopy;  Laterality: N/A;  . BLADDER SUSPENSION  1980's  . BOTOX INJECTION N/A 09/21/2018   Procedure: BOTOX INJECTION;  Surgeon: Irene Shipper, MD;  Location: [facility];  Service: Endoscopy;  Laterality: N/A;  . CATARACT EXTRACTION W/ INTRAOCULAR LENS  IMPLANT, BILATERAL Bilateral 2010  . DILATION AND CURETTAGE OF UTERUS  1961  . ESOPHAGEAL MANOMETRY N/A 03/25/2018   Procedure: ESOPHAGEAL MANOMETRY (EM);  Surgeon:  Napoleon Form, MD;  Location: [facility];  Service: Endoscopy;  Laterality: N/A;  . ESOPHAGOGASTRODUODENOSCOPY (EGD) WITH PROPOFOL N/A 04/07/2018   Procedure: ESOPHAGOGASTRODUODENOSCOPY (EGD) WITH PROPOFOL;  Surgeon: Sherrilyn Rist, MD;  Location: [facility]COPY;  Service: Gastroenterology;  Laterality: N/A;  . ESOPHAGOGASTRODUODENOSCOPY (EGD) WITH PROPOFOL N/A 09/21/2018   Procedure: ESOPHAGOGASTRODUODENOSCOPY (EGD) WITH PROPOFOL;  Surgeon: Hilarie Fredrickson, MD;  Location: [facility];  Service: Endoscopy;  Laterality: N/A;  .  HERNIA REPAIR    . HIATAL HERNIA REPAIR N/A 08/02/2016   Procedure: LAPAROSCOPIC REPAIR OF LARGE  HIATAL HERNIA;  Surgeon: Glenna Fellows, MD;  Location: [facility];  Service: General;  Laterality: N/A;  . JOINT REPLACEMENT    . LAPAROSCOPIC NISSEN FUNDOPLICATION N/A 08/02/2016   Procedure: LAPAROSCOPIC NISSEN FUNDOPLICATION;  Surgeon: Glenna Fellows, MD;  Location: [facility];  Service: General;  Laterality: N/A;  . LUMBAR LAMINECTOMY  1990; 1994; [id number]   "I've got 2 stainless steel rods; 6 screws; 2 ray cages"took bone from right hip to put in back  . TONSILLECTOMY AND ADENOIDECTOMY  1945  . TOTAL KNEE ARTHROPLASTY Right ~ 1996  . TUBAL LIGATION  ~ 1976    Social History Latamara C Suhre  reports that she has never smoked. She has never used smokeless tobacco. She reports that she does not drink alcohol or use drugs.  family history includes Heart attack in her brother; Heart disease in her brother; Heart disease (age of onset: 38) in her father; Hypertension in her mother; Kidney disease in her mother; Suicidality in her son.  Allergies  Allergen Reactions  . Morphine Other (See Comments)    Flushing, rash, itching  . Tape Other (See Comments)    Band aides, adhesive tape Redness and pulls skin off       PHYSICAL EXAMINATION: Vital signs: BP (!) 152/84 (BP Location: Left Arm, Patient Position: Sitting, Cuff Size: Normal)   Pulse 60   Temp 98.6 F (37 C) (Oral)   Ht 5\' 3"  (1.6 m)   Wt 144 lb (65.3 kg)   BMI 25.51 kg/m   Constitutional: generally well-appearing, no acute distress Psychiatric: alert and oriented x3, cooperative Eyes: extraocular movements intact, anicteric, conjunctiva pink Mouth: oral pharynx moist, no lesions Neck: supple no lymphadenopathy Cardiovascular: heart regular rate and rhythm, no murmur Lungs: clear to auscultation bilaterally Abdomen: soft, nontender, nondistended, no obvious ascites, no peritoneal signs, normal bowel sounds, no  organomegaly Rectal: Omitted Extremities: no clubbing or cyanosis.  1+ lower extremity edema bilaterally Skin: no lesions on visible extremities Neuro: No focal deficits.  Cranial nerves intact  ASSESSMENT:  1.  GERD with history of peptic stricture large hiatal hernia status post fundoplication 2.  Esophageal dysmotility primary versus secondary 3.  Multiple general medical problems   PLAN:  1.  Reflux precautions 2.  Continue twice daily pantoprazole 3.  Antacids on demand 4.  Schedule repeat upper endoscopy with Botox injection and balloon dilation of the esophagus at the hospital with monitored anesthesia care.  The patient is high risk given her age and comorbidities.The nature of the procedure, as well as the risks, benefits, and alternatives were carefully and thoroughly reviewed with the patient. Ample time for discussion and questions allowed. The patient understood, was satisfied, and agreed to proceed. 5.  Ongoing medical care with primary provider and other specialists

## 2019-07-09 ENCOUNTER — Telehealth: Payer: Self-pay | Admitting: Adult Health

## 2019-07-09 DIAGNOSIS — M479 Spondylosis, unspecified: Secondary | ICD-10-CM | POA: Diagnosis not present

## 2019-07-09 DIAGNOSIS — I340 Nonrheumatic mitral (valve) insufficiency: Secondary | ICD-10-CM | POA: Diagnosis not present

## 2019-07-09 DIAGNOSIS — F419 Anxiety disorder, unspecified: Secondary | ICD-10-CM | POA: Diagnosis not present

## 2019-07-09 DIAGNOSIS — M170 Bilateral primary osteoarthritis of knee: Secondary | ICD-10-CM | POA: Diagnosis not present

## 2019-07-09 DIAGNOSIS — M19041 Primary osteoarthritis, right hand: Secondary | ICD-10-CM | POA: Diagnosis not present

## 2019-07-09 DIAGNOSIS — F319 Bipolar disorder, unspecified: Secondary | ICD-10-CM | POA: Diagnosis not present

## 2019-07-09 DIAGNOSIS — M545 Low back pain: Secondary | ICD-10-CM | POA: Diagnosis not present

## 2019-07-09 DIAGNOSIS — I10 Essential (primary) hypertension: Secondary | ICD-10-CM | POA: Diagnosis not present

## 2019-07-09 DIAGNOSIS — M19042 Primary osteoarthritis, left hand: Secondary | ICD-10-CM | POA: Diagnosis not present

## 2019-07-09 DIAGNOSIS — M797 Fibromyalgia: Secondary | ICD-10-CM | POA: Diagnosis not present

## 2019-07-09 DIAGNOSIS — J449 Chronic obstructive pulmonary disease, unspecified: Secondary | ICD-10-CM | POA: Diagnosis not present

## 2019-07-09 DIAGNOSIS — M810 Age-related osteoporosis without current pathological fracture: Secondary | ICD-10-CM | POA: Diagnosis not present

## 2019-07-09 DIAGNOSIS — M069 Rheumatoid arthritis, unspecified: Secondary | ICD-10-CM | POA: Diagnosis not present

## 2019-07-09 DIAGNOSIS — I2510 Atherosclerotic heart disease of native coronary artery without angina pectoris: Secondary | ICD-10-CM | POA: Diagnosis not present

## 2019-07-09 DIAGNOSIS — K449 Diaphragmatic hernia without obstruction or gangrene: Secondary | ICD-10-CM | POA: Diagnosis not present

## 2019-07-09 DIAGNOSIS — G8929 Other chronic pain: Secondary | ICD-10-CM | POA: Diagnosis not present

## 2019-07-09 NOTE — Telephone Encounter (Signed)
See note

## 2019-07-09 NOTE — Telephone Encounter (Signed)
[facility]  Frequency: 2 week 1 and 1 week 3   CB: [phone]

## 2019-07-10 ENCOUNTER — Other Ambulatory Visit: Payer: Self-pay | Admitting: Adult Health

## 2019-07-12 NOTE — Progress Notes (Deleted)
CARDIOLOGY CONSULT NOTE       Patient ID: Julie Rice MRN: [id number] DOB/AGE: 01-01-1936 83 y.o.  Admit date: (Not on file) Referring Physician: Nfziger  Primary Physician: [name], NP Primary Cardiologist: New Reason for Consultation: Chest Pain, ? Claudication   Active Problems:   * No active hospital problems. *   HPI:  83 y.o. with history of HTN, GERD and esophageal stricture, fibromyalgia and hiatal hernia.and COPD  No history of CAD, CHF or afib. She has bad arthritis in her knees and sees rheumatology referred to PT for strengthening of her legs. Uses Tramadol from time to time She has dyspnea related to COPD sees pulmonary Dr Melvyn Novas prescribes albuterol Monitor done 04/27/19 with SR short bursts atrial tachycardia no afib CT 06/01/19 with ? atyical infection rather than ILD? 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 I saw her in 2015 for edema with atypical chest pain and normal cath Mild MR/AR at that time I was concerned about chronic aspiration given her large hiatal hernia, stricture and esophageal dysmotility   ***  ROS All other systems reviewed and negative except as noted above  Past Medical History:  Diagnosis Date  . Anemia   . Anxiety   . Bipolar disorder ([place])   . Blood transfusion 1991   autologous pts own blood given   . CAD (coronary artery disease)   . Cataract   . Chest pain    "@ rest, lying down, w/exertion"  . Chronic back pain    "mostly lower back but I do have upper back pain regularly" (04/06/2018)  . COPD (chronic obstructive pulmonary disease) ([place])   . Depression   . Esophageal stricture   . Fecal occult blood test positive   . Fibromyalgia   . GERD (gastroesophageal reflux disease)   . Heart murmur    "slight" (04/06/2018)  . Hiatal hernia   . Hyperlipidemia    Patient denies  . Hypertension   . Internal hemorrhoids   . Lumbago   . Mitral regurgitation   . Osteoarthritis    . Osteoporosis   . Pneumonia ~ 01/2018  . PONV (postoperative nausea and vomiting)    severe ponv, "in the past" (04/06/2018)  . Rectal bleeding   . Rheumatoid arthritis ([place])   . Spondylosis   . TIA (transient ischemic attack) 2013  . Urinary incontinence    wears depends    Family History  Problem Relation Age of Onset  . Kidney disease Mother   . Hypertension Mother   . Heart disease Father 12       MI  . Suicidality Son   . Heart disease Brother   . Heart attack Brother   . Colon cancer Neg Hx   . Esophageal cancer Neg Hx   . Pancreatic cancer Neg Hx   . Rectal cancer Neg Hx   . Stomach cancer Neg Hx     Social History   Socioeconomic History  . Marital status: Widowed    Spouse name: Not on file  . Number of children: 2  . Years of education: Not on file  . Highest education level: Not on file  Occupational History    Employer: RETIRED  Social Needs  . Financial resource strain: Not on file  . Food insecurity    Worry: Not on file    Inability: Not on file  . Transportation needs    Medical: Not on file  Non-medical: Not on file  Tobacco Use  . Smoking status: Never Smoker  . Smokeless tobacco: Never Used  Substance and Sexual Activity  . Alcohol use: Never    Frequency: Never  . Drug use: Never  . Sexual activity: Not Currently  Lifestyle  . Physical activity    Days per week: Not on file    Minutes per session: Not on file  . Stress: Not on file  Relationships  . Social Herbalist on phone: Not on file    Gets together: Not on file    Attends religious service: Not on file    Active member of club or organization: Not on file    Attends meetings of clubs or organizations: Not on file    Relationship status: Not on file  . Intimate partner violence    Fear of current or ex partner: Not on file    Emotionally abused: Not on file    Physically abused: Not on file    Forced sexual activity: Not on file  Other Topics Concern  . Not on  file  Social History Narrative   Retired    Widowed    Past Surgical History:  Procedure Laterality Date  . ABDOMINAL HYSTERECTOMY  1982  . BACK SURGERY    . BALLOON DILATION N/A 09/21/2018   Procedure: BALLOON DILATION;  Surgeon: Irene Shipper, MD;  Location: [facility];  Service: Endoscopy;  Laterality: N/A;  . BLADDER SUSPENSION  1980's  . BOTOX INJECTION N/A 09/21/2018   Procedure: BOTOX INJECTION;  Surgeon: Irene Shipper, MD;  Location: [facility];  Service: Endoscopy;  Laterality: N/A;  . CATARACT EXTRACTION W/ INTRAOCULAR LENS  IMPLANT, BILATERAL Bilateral 2010  . DILATION AND CURETTAGE OF UTERUS  1961  . ESOPHAGEAL MANOMETRY N/A 03/25/2018   Procedure: ESOPHAGEAL MANOMETRY (EM);  Surgeon: Mauri Pole, MD;  Location: [facility];  Service: Endoscopy;  Laterality: N/A;  . ESOPHAGOGASTRODUODENOSCOPY (EGD) WITH PROPOFOL N/A 04/07/2018   Procedure: ESOPHAGOGASTRODUODENOSCOPY (EGD) WITH PROPOFOL;  Surgeon: Doran Stabler, MD;  Location: [facility];  Service: Gastroenterology;  Laterality: N/A;  . ESOPHAGOGASTRODUODENOSCOPY (EGD) WITH PROPOFOL N/A 09/21/2018   Procedure: ESOPHAGOGASTRODUODENOSCOPY (EGD) WITH PROPOFOL;  Surgeon: Irene Shipper, MD;  Location: [facility];  Service: Endoscopy;  Laterality: N/A;  . HERNIA REPAIR    . HIATAL HERNIA REPAIR N/A 08/02/2016   Procedure: LAPAROSCOPIC REPAIR OF LARGE  HIATAL HERNIA;  Surgeon: Excell Seltzer, MD;  Location: [facility];  Service: General;  Laterality: N/A;  . JOINT REPLACEMENT    . LAPAROSCOPIC NISSEN FUNDOPLICATION N/A 9/32/6712   Procedure: LAPAROSCOPIC NISSEN FUNDOPLICATION;  Surgeon: Excell Seltzer, MD;  Location: [facility];  Service: General;  Laterality: N/A;  . [place]; 1994; 4580;9983   "I've got 2 stainless steel rods; 6 screws; 2 ray cages"took bone from right hip to put in back  . TONSILLECTOMY AND ADENOIDECTOMY  1945  . TOTAL KNEE ARTHROPLASTY Right ~ 1996  . TUBAL LIGATION  ~ 1976         Physical Exam: There were no vitals taken for this visit.    Affect appropriate Frail elderly female  HEENT: normal Neck supple with no adenopathy JVP normal no bruits no thyromegaly Lungs clear with no wheezing and good diaphragmatic motion Heart:  S1/S2 no murmur, no rub, gallop or click PMI normal Abdomen: benighn, BS positve, no tenderness, no AAA no bruit.  No HSM or HJR Distal pulses intact with no  bruits No edema Neuro non-focal Skin warm and dry No muscular weakness   Labs:   Lab Results  Component Value Date   WBC 7.2 03/30/2019   HGB 10.8 (L) 03/30/2019   HCT 32.7 (L) 03/30/2019   MCV 84.9 03/30/2019   PLT 295.0 03/30/2019   No results for input(s): NA, K, CL, CO2, BUN, CREATININE, CALCIUM, PROT, BILITOT, ALKPHOS, ALT, AST, GLUCOSE in the last 168 hours.  Invalid input(s): LABALBU No results found for: CKTOTAL, CKMB, CKMBINDEX, TROPONINI  Lab Results  Component Value Date   CHOL 234 (H) 04/25/2017   CHOL 211 (H) 04/11/2015   CHOL 212 (H) 06/01/2013   Lab Results  Component Value Date   HDL 65.00 04/25/2017   HDL 54.30 04/11/2015   HDL 50.80 06/01/2013   Lab Results  Component Value Date   LDLCALC 131 (H) 04/25/2017   LDLCALC 124 (H) 04/11/2015   Lab Results  Component Value Date   TRIG 191.0 (H) 04/25/2017   TRIG 162.0 (H) 04/11/2015   TRIG 297.0 (H) 06/01/2013   Lab Results  Component Value Date   CHOLHDL 4 04/25/2017   CHOLHDL 4 04/11/2015   CHOLHDL 4 06/01/2013   Lab Results  Component Value Date   LDLDIRECT 122.2 06/01/2013   LDLDIRECT 210.7 04/17/2011   LDLDIRECT 153.4 01/10/2010      Radiology: No results found.  EKG: SR PAC poor R wave progression 03/30/19   ASSESSMENT AND PLAN:   1. Chest Pain:  *** 2.  Dyspnea:  Related to pulmonary disease needs f/u with Dr Melvyn Novas regarding MAI and ? Chronic aspiration  3. HTN: Well controlled.  Continue current medications and low sodium Dash type diet.   4. Leg Pain:  Related  to her arthritis *** 5. MR/AR:  History of mild regurgitation by echo 2015  6. Arrhythmia:  Benign PAC/PVCls on beta blocker by monitor 04/14/19  Signed: Jenkins Rouge 07/12/2019, 7:26 PM

## 2019-07-12 NOTE — Telephone Encounter (Signed)
Ok for orders? 

## 2019-07-14 ENCOUNTER — Encounter: Payer: Self-pay | Admitting: Adult Health

## 2019-07-14 ENCOUNTER — Other Ambulatory Visit: Payer: Self-pay | Admitting: Adult Health

## 2019-07-14 ENCOUNTER — Encounter: Payer: Medicare Other | Admitting: Adult Health

## 2019-07-14 NOTE — Telephone Encounter (Signed)
[facility] notified to proceed with PT orders.  Nothing further needed.

## 2019-07-14 NOTE — Progress Notes (Deleted)
   Subjective:    Patient ID: Julie Rice, female    DOB: 05-27-1936, 83 y.o.   MRN: [id number]  HPI Patient presents for yearly preventative medicine examination. She is a pleasant 83 year old female who  has a past medical history of Anemia, Anxiety, Bipolar disorder ([place]), Blood transfusion (1991), CAD (coronary artery disease), Cataract, Chest pain, Chronic back pain, COPD (chronic obstructive pulmonary disease) ([place]), Depression, Esophageal stricture, Fecal occult blood test positive, Fibromyalgia, GERD (gastroesophageal reflux disease), Heart murmur, Hiatal hernia, Hyperlipidemia, Hypertension, Internal hemorrhoids, Lumbago, Mitral regurgitation, Osteoarthritis, Osteoporosis, Pneumonia (~ 01/2018), PONV (postoperative nausea and vomiting), Rectal bleeding, Rheumatoid arthritis ([place]), Spondylosis, TIA (transient ischemic attack) (2013), and Urinary incontinence.  GERD with history of peptic stricture, large hiatal hernia status post fundal plication-currently followed by gastroenterology, was last seen 7 days ago.  She was advised to continue twice daily Protonix.  Has repeat upper endoscopy with Botox injection and balloon dilation of esophagus scheduled for October.  Essential Hypertension -currently prescribed Hyzaar 100-25 mg and Coreg 3.125 mg twice daily BP Readings from Last 3 Encounters:  07/07/19 (!) 152/84  06/07/19 120/80  06/07/19 (!) 149/73   Anxiety and Depression - takes klonopin 0.5 mg BID PRN and Zoloft 100 mg daily.   Osteoarthritis - involving multiple joints. Currently prescribed Mobic 15 mg daily and Tramadol 50 mg Q12H PRN  All immunizations and health maintenance protocols were reviewed with the patient and needed orders were placed.  Appropriate screening laboratory values were ordered for the patient including screening of hyperlipidemia, renal function and hepatic function. If indicated by BPH, a PSA was ordered.  Medication reconciliation,  past medical  history, social history, problem list and allergies were reviewed in detail with the patient  Goals were established with regard to weight loss, exercise, and  diet in compliance with medications  End of life planning was discussed.  Lab Results  Component Value Date   CHOL 234 (H) 04/25/2017   HDL 65.00 04/25/2017   LDLCALC 131 (H) 04/25/2017   LDLDIRECT 122.2 06/01/2013   TRIG 191.0 (H) 04/25/2017   CHOLHDL 4 04/25/2017      Review of Systems     Objective:   Physical Exam        Assessment & Plan:

## 2019-07-16 ENCOUNTER — Encounter: Payer: Self-pay | Admitting: Adult Health

## 2019-07-22 ENCOUNTER — Emergency Department (HOSPITAL_COMMUNITY): Payer: Medicare Other

## 2019-07-22 ENCOUNTER — Ambulatory Visit: Payer: Medicare Other | Admitting: Cardiovascular Disease

## 2019-07-22 ENCOUNTER — Inpatient Hospital Stay (HOSPITAL_COMMUNITY)
Admission: EM | Admit: 2019-07-22 | Discharge: 2019-07-27 | DRG: 286 | Disposition: A | Discharge status: Home Health | Payer: Medicare Other | Attending: Internal Medicine | Admitting: Internal Medicine

## 2019-07-22 ENCOUNTER — Encounter (HOSPITAL_COMMUNITY): Payer: Self-pay | Admitting: Emergency Medicine

## 2019-07-22 ENCOUNTER — Other Ambulatory Visit: Payer: Self-pay

## 2019-07-22 ENCOUNTER — Telehealth: Payer: Self-pay

## 2019-07-22 DIAGNOSIS — J9611 Chronic respiratory failure with hypoxia: Secondary | ICD-10-CM | POA: Diagnosis not present

## 2019-07-22 DIAGNOSIS — E876 Hypokalemia: Secondary | ICD-10-CM | POA: Diagnosis present

## 2019-07-22 DIAGNOSIS — Z885 Allergy status to narcotic agent status: Secondary | ICD-10-CM

## 2019-07-22 DIAGNOSIS — F411 Generalized anxiety disorder: Secondary | ICD-10-CM

## 2019-07-22 DIAGNOSIS — Z9842 Cataract extraction status, left eye: Secondary | ICD-10-CM

## 2019-07-22 DIAGNOSIS — J90 Pleural effusion, not elsewhere classified: Secondary | ICD-10-CM | POA: Diagnosis not present

## 2019-07-22 DIAGNOSIS — R0609 Other forms of dyspnea: Secondary | ICD-10-CM | POA: Diagnosis not present

## 2019-07-22 DIAGNOSIS — Z20828 Contact with and (suspected) exposure to other viral communicable diseases: Secondary | ICD-10-CM | POA: Diagnosis not present

## 2019-07-22 DIAGNOSIS — Z9841 Cataract extraction status, right eye: Secondary | ICD-10-CM

## 2019-07-22 DIAGNOSIS — I5030 Unspecified diastolic (congestive) heart failure: Secondary | ICD-10-CM

## 2019-07-22 DIAGNOSIS — R079 Chest pain, unspecified: Secondary | ICD-10-CM | POA: Diagnosis not present

## 2019-07-22 DIAGNOSIS — R0600 Dyspnea, unspecified: Secondary | ICD-10-CM | POA: Diagnosis present

## 2019-07-22 DIAGNOSIS — E785 Hyperlipidemia, unspecified: Secondary | ICD-10-CM | POA: Diagnosis not present

## 2019-07-22 DIAGNOSIS — I10 Essential (primary) hypertension: Secondary | ICD-10-CM | POA: Diagnosis not present

## 2019-07-22 DIAGNOSIS — R599 Enlarged lymph nodes, unspecified: Secondary | ICD-10-CM | POA: Diagnosis not present

## 2019-07-22 DIAGNOSIS — Z23 Encounter for immunization: Secondary | ICD-10-CM | POA: Diagnosis not present

## 2019-07-22 DIAGNOSIS — J4520 Mild intermittent asthma, uncomplicated: Secondary | ICD-10-CM | POA: Diagnosis not present

## 2019-07-22 DIAGNOSIS — G8929 Other chronic pain: Secondary | ICD-10-CM | POA: Diagnosis present

## 2019-07-22 DIAGNOSIS — I2721 Secondary pulmonary arterial hypertension: Secondary | ICD-10-CM | POA: Diagnosis present

## 2019-07-22 DIAGNOSIS — I340 Nonrheumatic mitral (valve) insufficiency: Secondary | ICD-10-CM | POA: Diagnosis not present

## 2019-07-22 DIAGNOSIS — I447 Left bundle-branch block, unspecified: Secondary | ICD-10-CM | POA: Diagnosis not present

## 2019-07-22 DIAGNOSIS — I361 Nonrheumatic tricuspid (valve) insufficiency: Secondary | ICD-10-CM | POA: Diagnosis not present

## 2019-07-22 DIAGNOSIS — J449 Chronic obstructive pulmonary disease, unspecified: Secondary | ICD-10-CM | POA: Diagnosis not present

## 2019-07-22 DIAGNOSIS — E038 Other specified hypothyroidism: Secondary | ICD-10-CM | POA: Diagnosis not present

## 2019-07-22 DIAGNOSIS — I426 Alcoholic cardiomyopathy: Secondary | ICD-10-CM | POA: Diagnosis not present

## 2019-07-22 DIAGNOSIS — J439 Emphysema, unspecified: Secondary | ICD-10-CM | POA: Diagnosis not present

## 2019-07-22 DIAGNOSIS — I5022 Chronic systolic (congestive) heart failure: Secondary | ICD-10-CM | POA: Diagnosis not present

## 2019-07-22 DIAGNOSIS — I5041 Acute combined systolic (congestive) and diastolic (congestive) heart failure: Secondary | ICD-10-CM | POA: Diagnosis not present

## 2019-07-22 DIAGNOSIS — I083 Combined rheumatic disorders of mitral, aortic and tricuspid valves: Secondary | ICD-10-CM | POA: Diagnosis present

## 2019-07-22 DIAGNOSIS — K224 Dyskinesia of esophagus: Secondary | ICD-10-CM | POA: Diagnosis present

## 2019-07-22 DIAGNOSIS — Z841 Family history of disorders of kidney and ureter: Secondary | ICD-10-CM

## 2019-07-22 DIAGNOSIS — M797 Fibromyalgia: Secondary | ICD-10-CM | POA: Diagnosis present

## 2019-07-22 DIAGNOSIS — I5032 Chronic diastolic (congestive) heart failure: Secondary | ICD-10-CM | POA: Diagnosis not present

## 2019-07-22 DIAGNOSIS — R0602 Shortness of breath: Secondary | ICD-10-CM

## 2019-07-22 DIAGNOSIS — Z96651 Presence of right artificial knee joint: Secondary | ICD-10-CM | POA: Diagnosis present

## 2019-07-22 DIAGNOSIS — K449 Diaphragmatic hernia without obstruction or gangrene: Secondary | ICD-10-CM | POA: Diagnosis present

## 2019-07-22 DIAGNOSIS — R0789 Other chest pain: Secondary | ICD-10-CM | POA: Diagnosis not present

## 2019-07-22 DIAGNOSIS — F319 Bipolar disorder, unspecified: Secondary | ICD-10-CM | POA: Diagnosis not present

## 2019-07-22 DIAGNOSIS — I5043 Acute on chronic combined systolic (congestive) and diastolic (congestive) heart failure: Secondary | ICD-10-CM | POA: Diagnosis not present

## 2019-07-22 DIAGNOSIS — R4702 Dysphasia: Secondary | ICD-10-CM | POA: Diagnosis present

## 2019-07-22 DIAGNOSIS — Z79899 Other long term (current) drug therapy: Secondary | ICD-10-CM

## 2019-07-22 DIAGNOSIS — I2510 Atherosclerotic heart disease of native coronary artery without angina pectoris: Secondary | ICD-10-CM | POA: Diagnosis present

## 2019-07-22 DIAGNOSIS — I255 Ischemic cardiomyopathy: Secondary | ICD-10-CM | POA: Diagnosis not present

## 2019-07-22 DIAGNOSIS — J45909 Unspecified asthma, uncomplicated: Secondary | ICD-10-CM | POA: Diagnosis present

## 2019-07-22 DIAGNOSIS — M069 Rheumatoid arthritis, unspecified: Secondary | ICD-10-CM | POA: Diagnosis present

## 2019-07-22 DIAGNOSIS — Z8249 Family history of ischemic heart disease and other diseases of the circulatory system: Secondary | ICD-10-CM

## 2019-07-22 DIAGNOSIS — Z7982 Long term (current) use of aspirin: Secondary | ICD-10-CM

## 2019-07-22 DIAGNOSIS — Z791 Long term (current) use of non-steroidal anti-inflammatories (NSAID): Secondary | ICD-10-CM

## 2019-07-22 DIAGNOSIS — I110 Hypertensive heart disease with heart failure: Secondary | ICD-10-CM | POA: Diagnosis not present

## 2019-07-22 DIAGNOSIS — Z91048 Other nonmedicinal substance allergy status: Secondary | ICD-10-CM

## 2019-07-22 DIAGNOSIS — I700 Atherosclerosis of aorta: Secondary | ICD-10-CM | POA: Diagnosis present

## 2019-07-22 DIAGNOSIS — M7989 Other specified soft tissue disorders: Secondary | ICD-10-CM | POA: Diagnosis not present

## 2019-07-22 DIAGNOSIS — E039 Hypothyroidism, unspecified: Secondary | ICD-10-CM | POA: Diagnosis not present

## 2019-07-22 DIAGNOSIS — D509 Iron deficiency anemia, unspecified: Secondary | ICD-10-CM | POA: Diagnosis present

## 2019-07-22 DIAGNOSIS — J189 Pneumonia, unspecified organism: Secondary | ICD-10-CM | POA: Diagnosis not present

## 2019-07-22 DIAGNOSIS — Z8673 Personal history of transient ischemic attack (TIA), and cerebral infarction without residual deficits: Secondary | ICD-10-CM | POA: Diagnosis not present

## 2019-07-22 DIAGNOSIS — I471 Supraventricular tachycardia: Secondary | ICD-10-CM | POA: Diagnosis not present

## 2019-07-22 DIAGNOSIS — K219 Gastro-esophageal reflux disease without esophagitis: Secondary | ICD-10-CM | POA: Diagnosis present

## 2019-07-22 DIAGNOSIS — Z90710 Acquired absence of both cervix and uterus: Secondary | ICD-10-CM

## 2019-07-22 DIAGNOSIS — R071 Chest pain on breathing: Secondary | ICD-10-CM | POA: Diagnosis not present

## 2019-07-22 DIAGNOSIS — F310 Bipolar disorder, current episode hypomanic: Secondary | ICD-10-CM | POA: Diagnosis not present

## 2019-07-22 DIAGNOSIS — Z961 Presence of intraocular lens: Secondary | ICD-10-CM | POA: Diagnosis present

## 2019-07-22 DIAGNOSIS — I5021 Acute systolic (congestive) heart failure: Secondary | ICD-10-CM | POA: Diagnosis present

## 2019-07-22 DIAGNOSIS — I509 Heart failure, unspecified: Secondary | ICD-10-CM

## 2019-07-22 DIAGNOSIS — I429 Cardiomyopathy, unspecified: Secondary | ICD-10-CM | POA: Diagnosis not present

## 2019-07-22 LAB — CBC WITH DIFFERENTIAL/PLATELET
Abs Immature Granulocytes: 0.03 10*3/uL (ref 0.00–0.07)
Basophils Absolute: 0.1 10*3/uL (ref 0.0–0.1)
Basophils Relative: 1 %
Eosinophils Absolute: 0.2 10*3/uL (ref 0.0–0.5)
Eosinophils Relative: 3 %
HCT: 29.4 % — ABNORMAL LOW (ref 36.0–46.0)
Hemoglobin: 9.4 g/dL — ABNORMAL LOW (ref 12.0–15.0)
Immature Granulocytes: 0 %
Lymphocytes Relative: 11 %
Lymphs Abs: 0.8 10*3/uL (ref 0.7–4.0)
MCH: 28.5 pg (ref 26.0–34.0)
MCHC: 32 g/dL (ref 30.0–36.0)
MCV: 89.1 fL (ref 80.0–100.0)
Monocytes Absolute: 0.4 10*3/uL (ref 0.1–1.0)
Monocytes Relative: 5 %
Neutro Abs: 5.6 10*3/uL (ref 1.7–7.7)
Neutrophils Relative %: 80 %
Platelets: 294 10*3/uL (ref 150–400)
RBC: 3.3 MIL/uL — ABNORMAL LOW (ref 3.87–5.11)
RDW: 14.2 % (ref 11.5–15.5)
WBC: 7.1 10*3/uL (ref 4.0–10.5)
nRBC: 0 % (ref 0.0–0.2)

## 2019-07-22 LAB — MRSA PCR SCREENING: MRSA by PCR: NEGATIVE

## 2019-07-22 LAB — COMPREHENSIVE METABOLIC PANEL
ALT: 13 U/L (ref 0–44)
AST: 18 U/L (ref 15–41)
Albumin: 3.4 g/dL — ABNORMAL LOW (ref 3.5–5.0)
Alkaline Phosphatase: 66 U/L (ref 38–126)
Anion gap: 11 (ref 5–15)
BUN: 11 mg/dL (ref 8–23)
CO2: 26 mmol/L (ref 22–32)
Calcium: 8.8 mg/dL — ABNORMAL LOW (ref 8.9–10.3)
Chloride: 103 mmol/L (ref 98–111)
Creatinine, Ser: 0.82 mg/dL (ref 0.44–1.00)
GFR calc Af Amer: >60 mL/min (ref >60)
GFR calc non Af Amer: >60 mL/min (ref >60)
Glucose, Bld: 108 mg/dL — ABNORMAL HIGH (ref 70–99)
Potassium: 3.4 mmol/L — ABNORMAL LOW (ref 3.5–5.1)
Sodium: 140 mmol/L (ref 135–145)
Total Bilirubin: 0.7 mg/dL (ref 0.3–1.2)
Total Protein: 6.2 g/dL — ABNORMAL LOW (ref 6.5–8.1)

## 2019-07-22 LAB — TROPONIN I (HIGH SENSITIVITY)
Troponin I (High Sensitivity): 8 ng/L (ref <18)
Troponin I (High Sensitivity): 8 ng/L (ref <18)

## 2019-07-22 LAB — POCT I-STAT EG7
Acid-Base Excess: 3 mmol/L — ABNORMAL HIGH (ref 0.0–2.0)
Bicarbonate: 29.2 mmol/L — ABNORMAL HIGH (ref 20.0–28.0)
Calcium, Ion: 1.19 mmol/L (ref 1.15–1.40)
HCT: 30 % — ABNORMAL LOW (ref 36.0–46.0)
Hemoglobin: 10.2 g/dL — ABNORMAL LOW (ref 12.0–15.0)
O2 Saturation: 87 %
Potassium: 3.3 mmol/L — ABNORMAL LOW (ref 3.5–5.1)
Sodium: 141 mmol/L (ref 135–145)
TCO2: 31 mmol/L (ref 22–32)
pCO2, Ven: 48.5 mmHg (ref 44.0–60.0)
pH, Ven: 7.387 (ref 7.250–7.430)
pO2, Ven: 55 mmHg — ABNORMAL HIGH (ref 32.0–45.0)

## 2019-07-22 LAB — BRAIN NATRIURETIC PEPTIDE: B Natriuretic Peptide: 723.7 pg/mL — ABNORMAL HIGH (ref 0.0–100.0)

## 2019-07-22 LAB — D-DIMER, QUANTITATIVE: D-Dimer, Quant: 1.74 ug/mL-FEU — ABNORMAL HIGH (ref 0.00–0.50)

## 2019-07-22 LAB — SARS CORONAVIRUS 2 BY RT PCR (HOSPITAL ORDER, PERFORMED IN ~~LOC~~ HOSPITAL LAB): SARS Coronavirus 2: NEGATIVE

## 2019-07-22 MED ORDER — FUROSEMIDE 10 MG/ML IJ SOLN
20.0000 mg | Freq: Once | INTRAMUSCULAR | Status: AC
  Administered 2019-07-22: 20 mg via INTRAVENOUS
  Filled 2019-07-22: qty 2

## 2019-07-22 MED ORDER — PANTOPRAZOLE SODIUM 40 MG IV SOLR
40.0000 mg | INTRAVENOUS | Status: DC
  Administered 2019-07-22 – 2019-07-26 (×5): 40 mg via INTRAVENOUS
  Filled 2019-07-22 (×5): qty 40

## 2019-07-22 MED ORDER — ONDANSETRON HCL 4 MG/2ML IJ SOLN
4.0000 mg | Freq: Four times a day (QID) | INTRAMUSCULAR | Status: DC | PRN
  Administered 2019-07-23 – 2019-07-25 (×3): 4 mg via INTRAVENOUS
  Filled 2019-07-22 (×4): qty 2

## 2019-07-22 MED ORDER — ENOXAPARIN SODIUM 40 MG/0.4ML ~~LOC~~ SOLN
40.0000 mg | SUBCUTANEOUS | Status: DC
  Administered 2019-07-22 – 2019-07-25 (×4): 40 mg via SUBCUTANEOUS
  Filled 2019-07-22 (×5): qty 0.4

## 2019-07-22 MED ORDER — IPRATROPIUM BROMIDE HFA 17 MCG/ACT IN AERS
2.0000 | INHALATION_SPRAY | Freq: Once | RESPIRATORY_TRACT | Status: AC
  Administered 2019-07-22: 2 via RESPIRATORY_TRACT
  Filled 2019-07-22: qty 12.9

## 2019-07-22 MED ORDER — ASPIRIN EC 81 MG PO TBEC
81.0000 mg | DELAYED_RELEASE_TABLET | Freq: Every day | ORAL | Status: DC
  Administered 2019-07-23 – 2019-07-27 (×4): 81 mg via ORAL
  Filled 2019-07-22 (×4): qty 1

## 2019-07-22 MED ORDER — SUCRALFATE 1 G PO TABS
1.0000 g | ORAL_TABLET | Freq: Four times a day (QID) | ORAL | Status: DC | PRN
  Administered 2019-07-24 – 2019-07-25 (×3): 1 g via ORAL
  Filled 2019-07-22 (×3): qty 1

## 2019-07-22 MED ORDER — SODIUM CHLORIDE 0.9% FLUSH: 3.0000 mL | INTRAVENOUS | Status: DC | PRN

## 2019-07-22 MED ORDER — CARVEDILOL 3.125 MG PO TABS
3.1250 mg | ORAL_TABLET | Freq: Two times a day (BID) | ORAL | Status: DC
  Administered 2019-07-22 – 2019-07-27 (×10): 3.125 mg via ORAL
  Filled 2019-07-22 (×11): qty 1

## 2019-07-22 MED ORDER — SERTRALINE HCL 100 MG PO TABS
100.0000 mg | ORAL_TABLET | Freq: Every day | ORAL | Status: DC
  Administered 2019-07-22 – 2019-07-26 (×5): 100 mg via ORAL
  Filled 2019-07-22 (×6): qty 1

## 2019-07-22 MED ORDER — TRAMADOL HCL 50 MG PO TABS
50.0000 mg | ORAL_TABLET | Freq: Two times a day (BID) | ORAL | Status: DC | PRN
  Administered 2019-07-23 – 2019-07-27 (×4): 50 mg via ORAL
  Filled 2019-07-22 (×4): qty 1

## 2019-07-22 MED ORDER — IOHEXOL 350 MG/ML SOLN
100.0000 mL | Freq: Once | INTRAVENOUS | Status: AC | PRN
  Administered 2019-07-22: 100 mL via INTRAVENOUS

## 2019-07-22 MED ORDER — GABAPENTIN 100 MG PO CAPS
100.0000 mg | ORAL_CAPSULE | Freq: Three times a day (TID) | ORAL | Status: DC
  Administered 2019-07-22 – 2019-07-27 (×14): 100 mg via ORAL
  Filled 2019-07-22 (×14): qty 1

## 2019-07-22 MED ORDER — CLONAZEPAM 0.5 MG PO TABS
0.5000 mg | ORAL_TABLET | Freq: Two times a day (BID) | ORAL | Status: DC | PRN
  Administered 2019-07-22 – 2019-07-26 (×4): 0.5 mg via ORAL
  Filled 2019-07-22 (×4): qty 1

## 2019-07-22 MED ORDER — SODIUM CHLORIDE 0.9 % IV SOLN: 250.0000 mL | INTRAVENOUS | Status: DC | PRN

## 2019-07-22 MED ORDER — AMLODIPINE BESYLATE 2.5 MG PO TABS
2.5000 mg | ORAL_TABLET | Freq: Every day | ORAL | Status: DC
  Administered 2019-07-23: 2.5 mg via ORAL
  Filled 2019-07-22: qty 1

## 2019-07-22 MED ORDER — ACETAMINOPHEN 325 MG PO TABS
650.0000 mg | ORAL_TABLET | ORAL | Status: DC | PRN
  Administered 2019-07-26: 19:00:00 650 mg via ORAL
  Filled 2019-07-22 (×2): qty 2

## 2019-07-22 MED ORDER — ALBUTEROL SULFATE HFA 108 (90 BASE) MCG/ACT IN AERS
4.0000 | INHALATION_SPRAY | RESPIRATORY_TRACT | Status: DC | PRN
  Administered 2019-07-22: 4 via RESPIRATORY_TRACT
  Filled 2019-07-22: qty 6.7

## 2019-07-22 NOTE — ED Notes (Signed)
Pt aware that she needs to ambulate with Pulse Ox, will have her attempt after inhalers administered.

## 2019-07-22 NOTE — ED Notes (Signed)
Admitting provider at bedside.

## 2019-07-22 NOTE — ED Notes (Signed)
Patient transported to CT 

## 2019-07-22 NOTE — H&P (Signed)
History and Physical    Julie Rice JWJ:[id number] DOB: 12/06/1935 DOA: 07/22/2019  PCP: [name], [name], NP Consultants:  Corliss Skainseveshwar - rheumatology; Wert - pulmonology; Marina GoodellPerry - GI Patient coming from:  Home - lives alone; NOK: Janell QuietSister-in-law, [phone]  Chief Complaint: SOB  HPI: Julie Rice is a 83 y.o. female with medical history significant of TIA; TA; HTN; HLD; fibromyalgia with chronic pain; COPD; CAD; and bipolar presenting with SOB.  She reports significant SOB.  She has been weak and SOB.  She had an appointment with Dr. Eden EmmsNishan and they said she should come to the ER.  She has left flank pain.  SOB is with ambulation, not bad on short distances.  She has nighttime cough, waking her up about 3AM.  She feels a rattling in her throat without wheezing.  Cough is nonproductive.  No fevers.  No weight change.  Mild LE edema in the late evening or night.  2 pillow orthopnea.  No PND but cough wakes her up at night.  She uses her inhaler in the AM with some relief.   ED Course:  ?new CHF.  Acute on chronic SOB, extensive evaluation by pulm - asthma/COPD.  H/o abnl chest CT with pulm nodules.  Now with exertional SOB, pulm edema on CXR, elevated BNP.  Negative COVID.  Now with pleural effusions and LAD.  Likely needs Echo, maybe Lasix - given 20 mg IV.  Review of Systems: As per HPI; otherwise review of systems reviewed and negative.   Ambulatory Status:  Ambulates without assistance  Past Medical History:  Diagnosis Date   Anemia    Anxiety    Bipolar disorder (HCC)    Blood transfusion 1991   autologous pts own blood given    CAD (coronary artery disease)    Cataract    Chest pain    "@ rest, lying down, w/exertion"   Chronic back pain    "mostly lower back but I do have upper back pain regularly" (04/06/2018)   COPD (chronic obstructive pulmonary disease) (HCC)    Depression    Esophageal stricture    Fibromyalgia    GERD (gastroesophageal reflux disease)      Heart murmur    "slight" (04/06/2018)   Hiatal hernia    Hyperlipidemia    Patient denies   Hypertension    Internal hemorrhoids    Lumbago    Mitral regurgitation    Osteoarthritis    Osteoporosis    Pneumonia ~ 01/2018   PONV (postoperative nausea and vomiting)    severe ponv, "in the past" (04/06/2018)   Rectal bleeding    Rheumatoid arthritis (HCC)    Spondylosis    TIA (transient ischemic attack) 2013   Urinary incontinence    wears depends    Past Surgical History:  Procedure Laterality Date   ABDOMINAL HYSTERECTOMY  1982   BACK SURGERY     BALLOON DILATION N/A 09/21/2018   Procedure: BALLOON DILATION;  Surgeon: Hilarie FredricksonPerry, John N, MD;  Location: [facility];  Service: Endoscopy;  Laterality: N/A;   BLADDER SUSPENSION  1980's   BOTOX INJECTION N/A 09/21/2018   Procedure: BOTOX INJECTION;  Surgeon: Hilarie FredricksonPerry, John N, MD;  Location: [facility];  Service: Endoscopy;  Laterality: N/A;   CATARACT EXTRACTION W/ INTRAOCULAR LENS  IMPLANT, BILATERAL Bilateral 2010   DILATION AND CURETTAGE OF UTERUS  1961   ESOPHAGEAL MANOMETRY N/A 03/25/2018   Procedure: ESOPHAGEAL MANOMETRY (EM);  Surgeon: Napoleon FormNandigam, Kavitha V, MD;  Location: [facility];  Service: Endoscopy;  Laterality: N/A;   ESOPHAGOGASTRODUODENOSCOPY (EGD) WITH PROPOFOL N/A 04/07/2018   Procedure: ESOPHAGOGASTRODUODENOSCOPY (EGD) WITH PROPOFOL;  Surgeon: Sherrilyn Rist, MD;  Location: [facility];  Service: Gastroenterology;  Laterality: N/A;   ESOPHAGOGASTRODUODENOSCOPY (EGD) WITH PROPOFOL N/A 09/21/2018   Procedure: ESOPHAGOGASTRODUODENOSCOPY (EGD) WITH PROPOFOL;  Surgeon: Hilarie Fredrickson, MD;  Location: [facility];  Service: Endoscopy;  Laterality: N/A;   HERNIA REPAIR     HIATAL HERNIA REPAIR N/A 08/02/2016   Procedure: LAPAROSCOPIC REPAIR OF LARGE  HIATAL HERNIA;  Surgeon: Glenna Fellows, MD;  Location: [facility];  Service: General;  Laterality: N/A;   JOINT REPLACEMENT     LAPAROSCOPIC  NISSEN FUNDOPLICATION N/A 08/02/2016   Procedure: LAPAROSCOPIC NISSEN FUNDOPLICATION;  Surgeon: Glenna Fellows, MD;  Location: [facility];  Service: General;  Laterality: N/A;   LUMBAR LAMINECTOMY  1990; 1994; [id number]   "I've got 2 stainless steel rods; 6 screws; 2 ray cages"took bone from right hip to put in back   TONSILLECTOMY AND ADENOIDECTOMY  1945   TOTAL KNEE ARTHROPLASTY Right ~ 1996   TUBAL LIGATION  ~ 1976    Social History   Socioeconomic History   Marital status: Widowed    Spouse name: Not on file   Number of children: 2   Years of education: Not on file   Highest education level: Not on file  Occupational History    Employer: RETIRED  Social Needs   Financial resource strain: Not on file   Food insecurity    Worry: Not on file    Inability: Not on file   Transportation needs    Medical: Not on file    Non-medical: Not on file  Tobacco Use   Smoking status: Never Smoker   Smokeless tobacco: Never Used  Substance and Sexual Activity   Alcohol use: Never    Frequency: Never   Drug use: Never   Sexual activity: Not Currently  Lifestyle   Physical activity    Days per week: Not on file    Minutes per session: Not on file   Stress: Not on file  Relationships   Social connections    Talks on phone: Not on file    Gets together: Not on file    Attends religious service: Not on file    Active member of club or organization: Not on file    Attends meetings of clubs or organizations: Not on file    Relationship status: Not on file   Intimate partner violence    Fear of current or ex partner: Not on file    Emotionally abused: Not on file    Physically abused: Not on file    Forced sexual activity: Not on file  Other Topics Concern   Not on file  Social History Narrative   Retired    Widowed    Allergies  Allergen Reactions   Morphine Other (See Comments)    Flushing, rash, itching   Tape Other (See Comments)    Band aides,  adhesive tape Redness and pulls skin off    Family History  Problem Relation Age of Onset   Kidney disease Mother    Hypertension Mother    Heart disease Father 2       MI   Suicidality Son    Heart disease Brother    Heart attack Brother    Colon cancer Neg Hx    Esophageal cancer Neg Hx    Pancreatic cancer Neg Hx  Rectal cancer Neg Hx    Stomach cancer Neg Hx     Prior to Admission medications   Medication Sig Start Date End Date Taking? Authorizing Provider  acetaminophen (TYLENOL) 500 MG tablet Take 1,000 mg by mouth 2 (two) times daily as needed for moderate pain (arthritis).    Yes [provider]  albuterol (VENTOLIN HFA) 108 (90 Base) MCG/ACT inhaler Inhale 2 puffs into the lungs every 6 (six) hours as needed for wheezing or shortness of breath. 02/23/19  Yes [name], Tammy S, NP  amLODipine (NORVASC) 2.5 MG tablet Take 2.5 mg by mouth daily.   Yes [provider]  aspirin 81 MG tablet Take 1 tablet (81 mg total) by mouth daily. For cardiovascular health. 02/07/12  Yes Viviann Spare, FNP  carvedilol (COREG) 3.125 MG tablet Take 1 tablet (3.125 mg total) by mouth 2 (two) times daily with a meal. 05/11/19  Yes Nafziger, [name], NP  clonazePAM (KLONOPIN) 0.5 MG tablet TAKE 1 TABLET BY MOUTH TWICE A DAY AS NEEDED FOR ANXIETY 05/12/19  Yes Nafziger, [name], NP  gabapentin (NEURONTIN) 100 MG capsule TAKE 1 CAPSULE BY MOUTH THREE TIMES A DAY 07/13/19  Yes Nafziger, [name], NP  meloxicam (MOBIC) 15 MG tablet Take 1 tablet (15 mg total) by mouth daily. 11/05/18  Yes Nafziger, [name], NP  Multiple Vitamin (MULTIVITAMIN) tablet Take 1 tablet by mouth daily. Vitamin supplement. 02/07/12  Yes Viviann Spare, FNP  pantoprazole (PROTONIX) 40 MG tablet Take 1 tablet (40 mg total) by mouth 2 (two) times daily. 08/31/18  Yes Tressia Danas, MD  sertraline (ZOLOFT) 100 MG tablet TAKE 1 TABLET BY MOUTH EVERYDAY AT BEDTIME 07/16/19  Yes Nafziger, [name], NP  sucralfate  (CARAFATE) 1 g tablet Take 1 tablet (1 g total) by mouth 4 (four) times daily. Dissolve tablet in 10 ml warm water Patient taking differently: Take 1 g by mouth 4 (four) times daily as needed (heartburn). Dissolve tablet in 10 ml warm water 08/31/18  Yes Beavers, Cala Bradford, MD  tetrahydrozoline 0.05 % ophthalmic solution Place 1 drop into both eyes daily.   Yes [provider]  traMADol (ULTRAM) 50 MG tablet Take 1 tablet (50 mg total) by mouth every 12 (twelve) hours as needed. 06/29/19 07/29/19 Yes Shirline Frees, NP    Physical Exam: Vitals:   07/22/19 1515 07/22/19 1530 07/22/19 1600 07/22/19 1700  BP: (!) 166/79 (!) 168/68 (!) 160/80 (!) 174/98  Pulse: 93 83 86 91  Resp: 17 16 15 18   Temp:      TempSrc:      SpO2: 96% 94% 96% 95%      General:  Appears calm and comfortable and is NAD; she is on RA  Eyes:  PERRL, EOMI, normal lids, iris  ENT:  grossly normal hearing, lips & tongue, mmm  Neck:  no LAD, masses or thyromegaly  Cardiovascular:  RRR, no m/r/g. No LE edema.   Respiratory:   CTA bilaterally with no wheezes/rales/rhonchi.  Normal respiratory effort alternating with tachypnea to 30  Abdomen:  soft, NT, ND, NABS  Back:   normal alignment, no CVAT  Skin:  no rash or induration seen on limited exam  Musculoskeletal:  grossly normal tone BUE/BLE, good ROM, no bony abnormality  Psychiatric:  grossly normal mood and affect, speech fluent and appropriate, AOx3  Neurologic:  CN 2-12 grossly intact, moves all extremities in coordinated fashion, sensation intact    Radiological Exams on Admission: Ct Angio Chest Pe W And/or Wo Contrast  Result Date: 07/22/2019 CLINICAL DATA:  Shortness of breath and chest pain EXAM: CT ANGIOGRAPHY CHEST WITH CONTRAST TECHNIQUE: Multidetector CT imaging of the chest was performed using the standard protocol during bolus administration of intravenous contrast. Multiplanar CT image reconstructions and MIPs were obtained to  evaluate the vascular anatomy. CONTRAST:  100mL OMNIPAQUE IOHEXOL 350 MG/ML SOLN COMPARISON:  Chest radiograph July 22, 2019 and CT angiogram chest February 04, 2019; high-resolution chest CT June 01, 2019 FINDINGS: Cardiovascular: There is no demonstrable pulmonary embolus. There is no thoracic aortic aneurysm or dissection. The visualized great vessels show occasional foci of calcification. There is aortic atherosclerosis. There are scattered foci of coronary artery calcification. There is no pericardial effusion or pericardial thickening. The main pulmonary outflow tract measures 3.1 cm, prominent. Mediastinum/Nodes: No thyroid lesions are evident. There is a right hilar lymph node measuring 1.4 x 1.4 cm. There is subcarinal adenopathy measuring 2.8 x 2.1 cm. There is an aortopulmonary window lymph node measuring 1.6 x 1.2 cm. Esophagus remains distended with fluid and air throughout the esophagus. There is a focal hiatal hernia. Lungs/Pleura: There is underlying centrilobular emphysematous change. There are focal pleural effusions bilaterally with atelectatic change in each lung base. There is septal thickening in the lung bases with areas of reticulonodular interstitial prominence in the lower lobes. There are scattered areas of peribronchovascular ground-glass type appearance, similar to prior studies. Scattered subcentimeter pulmonary nodular lesions overall remain stable. Note that a 6 mm nodular opacity in the left upper lobe posterior segment noted previously is no longer evident. Upper Abdomen: There is reflux of contrast into the inferior vena cava and hepatic veins. There is upper abdominal aortic atherosclerosis. Visualized upper abdominal structures otherwise appear unremarkable. Musculoskeletal: There is extensive arthropathy throughout the lower thoracic region. No blastic or lytic bone lesions are evident. Postoperative change in upper lumbar spine. No chest wall lesions are evident. Review of the  MIP images confirms the above findings. IMPRESSION: 1. No demonstrable pulmonary embolus. No thoracic aortic aneurysm or dissection. There is aortic atherosclerosis as well as foci of coronary artery calcification. 2. Prominence of the main pulmonary outflow tract, a finding felt to indicate a degree of pulmonary arterial hypertension. 3. Areas of adenopathy noted with increased lymph node prominence compared to prior studies. Etiology for this adenopathy uncertain. Neoplastic etiology cannot be excluded. 4. Small pleural effusions bilaterally with bibasilar atelectasis. Underlying centrilobular emphysematous change. Areas of reticulonodular interstitial prominence and patchy peribronchovascular ground-glass type appearance. Scattered subcentimeter pulmonary nodules remain overall stable. No frank consolidation. 5. Hiatal hernia present. Esophagus appears distended containing fluid and air. Suspect esophageal motility disorder. 6. Reflux of contrast into the inferior vena cava and hepatic veins likely indicates increased right heart pressure. Aortic Atherosclerosis (ICD10-I70.0) and Emphysema (ICD10-J43.9). Electronically Signed   By: Bretta BangWilliam  Woodruff III M.D.   On: 07/22/2019 14:39   Dg Chest Portable 1 View  Result Date: 07/22/2019 CLINICAL DATA:  Shortness of breath. EXAM: PORTABLE CHEST 1 VIEW COMPARISON:  Mar 09, 2019 FINDINGS: Enlarged cardiac silhouette. Calcific atherosclerotic disease of the aorta. Lower lobe predominant interstitial and alveolar opacities. Osseous structures are without acute abnormality. Soft tissues are grossly normal. IMPRESSION: Lower lobe predominant interstitial and alveolar opacities may represent mixed pattern pulmonary edema. Electronically Signed   By: Ted Mcalpineobrinka  Dimitrova M.D.   On: 07/22/2019 10:25    EKG: Independently reviewed.  NSR with rate 82; LBBB   Labs on Admission: I have personally reviewed the available labs and imaging studies at  the time of the  admission.  Pertinent labs:   VBG: 7.387/48.5/55.0/29.2 K+ 3.4 Glucose 108 Albumin 3.4 BNP 723.7 HS troponin 8, 8 WBC 7.1 D-dimer 1.74 COVID negative  Assessment/Plan Principal Problem:   Dyspnea Active Problems:   Hypothyroidism   Dyslipidemia   Essential hypertension   Generalized anxiety disorder   Esophageal dysmotility   Asthma    Dyspnea with h/o asthma -Patient with acute on chronic dyspnea - likely mutlifactorial -Patient with prior evaluation by pulm (last seen 8/3) for possible underlying asthma -She has never smoked, although CT today indicates possible underlying COPD -CT was indicative of at least some component of R heart failure/pulm HTN; this can be followed up as an outpatient with pulm -CXR was concerning for possible edema and she was given Lasix 20 mg; edema was not appreciated on CTA and her history is less consistent with this, but will order echo -No PE on CTA despite elevated D-dimer -She does have nonspecific LAD on CT and will need ongoing monitoring/evaluation for this issue -Will observe on telemetry overnight but overall low suspicion for serious acute pathology given normal O2 sats on RA and evaluation thus far -Will order walk of life in the AM -Continue Albuterol HFA prn, although the patient was not wheezing at the time of my evaluation -COVID negative  Esophageal dysmotility -Prior h/o large symptomatic hiatal hernia requiring fundoplication with post-fundoplication dysphasia secondary to dysmotility -She is s/p EGD with Botox injection in 10/19 (she reports no improvement after) -She has had subsequent chronic cough and this has awakened her from sleep in the middle of the night recently -Discontinue Mobic -Change Protonix to IV BID for now -Continue Carafate -She saw Dr. [name] for this on 9/2 and is scheduled for repeat EGD with Botox and balloon dilation of the esophagus next month -Could consider doing this sooner if symptoms are  ongoing -Based on her descriptions, this appears likely the primary source of her current complaints  GAD -Continue Klonopin and Zoloft -Anxiety may also be contributing to her cough and SOB  Hypothyroidism -Normal thyroid testing in 3/20 -Continue Synthroid at current dose  HTN -Continue Norvasc, Coreg  HLD -She does not appear to be taking medication for this issue at this time    Note: This patient has been tested and is negative for the novel coronavirus COVID-19.   DVT prophylaxis:   Lovenox Code Status:  Full - confirmed with patient Family Communication: None present  Disposition Plan:  Home once clinically improved Consults called: None  Admission status: It is my clinical opinion that referral for OBSERVATION is reasonable and necessary in this patient based on the above information provided. The aforementioned taken together are felt to place the patient at high risk for further clinical deterioration. However it is anticipated that the patient may be medically stable for discharge from the hospital within 24 to 48 hours.    Jonah Blue MD [facility]   How to contact the [facility] Attending or Consulting provider 7A - 7P or covering provider during after hours 7P -7A, for this patient?  1. Check the care team in [facility] and look for a) attending/consulting [facility] provider listed and b) the [facility] team listed 2. Log into [email or web address] and use [place]'s universal password to access. If you do not have the password, please contact the hospital operator. 3. Locate the [facility] provider you are looking for under [facility] and page to a number that you can be directly reached. 4. If  you still have difficulty reaching the provider, please page the [facility] (Director on Call) for the Hospitalists listed on amion for assistance.   07/22/2019, 5:45 PM

## 2019-07-22 NOTE — ED Triage Notes (Signed)
Sob and cough and cp x 2 days, states sob worse when she lays flatper ems pt had elevaTION IN 2,3 has aiv given 324 asa and . 4 nitro  With out relief

## 2019-07-22 NOTE — Telephone Encounter (Signed)
Patient calling and states that she wants to reschedule her new patient appt with [name] today. She states that she does not feel safe to drive in and does not have anyone to bring her. Patient is extremely SOB on the phone and can only get 2 words out at a time. Patient denies swelling at this time, but states that she has CP with breathing. Patient has been seeing pulmonology. Patient's BP 150/53 and HR 97. Patient is not on any O2. She states that she has already used her albuterol inhaler this morning and it has not helped. Advised patient to be evaluated in the ER. She states that she will call 911.

## 2019-07-22 NOTE — ED Provider Notes (Signed)
[facility] EMERGENCY DEPARTMENT Provider Note   CSN: [id number] Arrival date & time: 07/22/19  4098     History   Chief Complaint Chief Complaint  Patient presents with   Shortness of Breath   Chest Pain    HPI Julie Rice is a 83 y.o. female.     Patient with PMH significant for never smoker, recurrent pneumonia/abnormal chest CT, possible asthma on albuterol, esophageal dysmotility, GERD, hiatal hernia status post Nissen fundoplication (September 2017), esophageal stricture --presents the emergency department with ongoing shortness of breath, chest pain with breathing.  Patient states that this is gotten much worse over the past 2 days however symptoms have been going on for about a week as patient has contacted her primary care doctor about this.  Pain is across her chest.  It does not radiate.  Patient called to reschedule her cardiology appointment today and was asked to go to the hospital when she sounded very short of breath over the phone.  Patient called EMS.  Patient with abnormal twelve-lead.  She was given nitroglycerin and aspirin in route.  Patient states that she sleeps on 2 pillows at night.  This is not changed.  She has not had any worsening lower extremity swelling.  No history of blood clot, heart attack.  She has been using her albuterol inhaler.  She is followed by pulmonology (Dr. Sherene Sires).  Patient was scheduled to see Dr. Eden Emms today.  I do not see previous echocardiogram.  No documented history of CHF.  No sick contacts or coronavirus exposures reported.     Past Medical History:  Diagnosis Date   Anemia    Anxiety    Bipolar disorder (HCC)    Blood transfusion 1991   autologous pts own blood given    CAD (coronary artery disease)    Cataract    Chest pain    "@ rest, lying down, w/exertion"   Chronic back pain    "mostly lower back but I do have upper back pain regularly" (04/06/2018)   COPD (chronic obstructive  pulmonary disease) (HCC)    Depression    Esophageal stricture    Fecal occult blood test positive    Fibromyalgia    GERD (gastroesophageal reflux disease)    Heart murmur    "slight" (04/06/2018)   Hiatal hernia    Hyperlipidemia    Patient denies   Hypertension    Internal hemorrhoids    Lumbago    Mitral regurgitation    Osteoarthritis    Osteoporosis    Pneumonia ~ 01/2018   PONV (postoperative nausea and vomiting)    severe ponv, "in the past" (04/06/2018)   Rectal bleeding    Rheumatoid arthritis (HCC)    Spondylosis    TIA (transient ischemic attack) 2013   Urinary incontinence    wears depends    Patient Active Problem List   Diagnosis Date Noted   Asthma 04/22/2019   Abnormal CT of the chest 02/23/2019   Esophageal motility disorder    Esophageal dysmotility    Loss of weight    Moderate protein-calorie malnutrition (HCC)    Hematemesis 04/06/2018   Periesophageal hiatal hernia 08/02/2016   Dysphagia 05/10/2016   Esophageal stricture 05/10/2016   Epigastric pain 05/10/2016   Generalized anxiety disorder 02/06/2012   Major depressive disorder, recurrent (HCC) 02/05/2012   Hypokalemia 11/09/2011   Nausea and vomiting in adult 11/08/2011   Dehydration 11/08/2011   Tremors of nervous system 11/08/2011   Hypothyroidism  05/10/2008   Dyslipidemia 05/10/2008   UNSPECIFIED ANEMIA 05/10/2008   CAROTID ARTERY DISEASE 05/10/2008   Transient cerebral ischemia 05/10/2008   Chronic back pain 05/10/2008   OSTEOPENIA 05/10/2008   Dyspnea 05/10/2008   ESOPHAGEAL STRICTURE 01/19/2008   Essential hypertension 04/29/2007   GERD 04/29/2007   Osteoarthritis 04/29/2007   SPONDYLOSIS 04/29/2007    Past Surgical History:  Procedure Laterality Date   ABDOMINAL HYSTERECTOMY  1982   BACK SURGERY     BALLOON DILATION N/A 09/21/2018   Procedure: BALLOON DILATION;  Surgeon: Hilarie FredricksonPerry, John N, MD;  Location: [facility];   Service: Endoscopy;  Laterality: N/A;   BLADDER SUSPENSION  1980's   BOTOX INJECTION N/A 09/21/2018   Procedure: BOTOX INJECTION;  Surgeon: Hilarie FredricksonPerry, John N, MD;  Location: [facility];  Service: Endoscopy;  Laterality: N/A;   CATARACT EXTRACTION W/ INTRAOCULAR LENS  IMPLANT, BILATERAL Bilateral 2010   DILATION AND CURETTAGE OF UTERUS  1961   ESOPHAGEAL MANOMETRY N/A 03/25/2018   Procedure: ESOPHAGEAL MANOMETRY (EM);  Surgeon: Napoleon FormNandigam, Kavitha V, MD;  Location: [facility];  Service: Endoscopy;  Laterality: N/A;   ESOPHAGOGASTRODUODENOSCOPY (EGD) WITH PROPOFOL N/A 04/07/2018   Procedure: ESOPHAGOGASTRODUODENOSCOPY (EGD) WITH PROPOFOL;  Surgeon: Sherrilyn Ristanis, Henry L III, MD;  [facility];  Service: Gastroenterology;  Laterality: N/A;   ESOPHAGOGASTRODUODENOSCOPY (EGD) WITH PROPOFOL N/A 09/21/2018   Procedure: ESOPHAGOGASTRODUODENOSCOPY (EGD) WITH PROPOFOL;  Surgeon: Hilarie FredricksonPerry, John N, MD;  Location: [facility];  Service: Endoscopy;  Laterality: N/A;   HERNIA REPAIR     HIATAL HERNIA REPAIR N/A 08/02/2016   Procedure: LAPAROSCOPIC REPAIR OF LARGE  HIATAL HERNIA;  Surgeon: Glenna FellowsBenjamin Hoxworth, MD;  Location: [facility];  Service: General;  Laterality: N/A;   JOINT REPLACEMENT     LAPAROSCOPIC NISSEN FUNDOPLICATION N/A 08/02/2016   Procedure: LAPAROSCOPIC NISSEN FUNDOPLICATION;  Surgeon: Glenna FellowsBenjamin Hoxworth, MD;  Location: [facility];  Service: General;  Laterality: N/A;   LUMBAR LAMINECTOMY  1990; 1994; [id number]; 1997;2000   "I've got 2 stainless steel rods; 6 screws; 2 ray cages"took bone from right hip to put in back   TONSILLECTOMY AND ADENOIDECTOMY  1945   TOTAL KNEE ARTHROPLASTY Right ~ 1996   TUBAL LIGATION  ~ 1976     OB History   No obstetric history on file.      Home Medications    Prior to Admission medications   Medication Sig Start Date End Date Taking? Authorizing Provider  acetaminophen (TYLENOL) 500 MG tablet Take 1,000 mg by mouth 2 (two) times daily as needed for moderate  pain (arthritis).    Yes [provider]  albuterol (VENTOLIN HFA) 108 (90 Base) MCG/ACT inhaler Inhale 2 puffs into the lungs every 6 (six) hours as needed for wheezing or shortness of breath. 02/23/19  Yes [name], Tammy S, NP  amLODipine (NORVASC) 2.5 MG tablet Take 2.5 mg by mouth daily.   Yes [provider]  aspirin 81 MG tablet Take 1 tablet (81 mg total) by mouth daily. For cardiovascular health. 02/07/12  Yes Viviann SpareScott, Margaret A, FNP  carvedilol (COREG) 3.125 MG tablet Take 1 tablet (3.125 mg total) by mouth 2 (two) times daily with a meal. 05/11/19  Yes Nafziger, Kandee Keenory, NP  clonazePAM (KLONOPIN) 0.5 MG tablet TAKE 1 TABLET BY MOUTH TWICE A DAY AS NEEDED FOR ANXIETY 05/12/19  Yes Nafziger, Kandee Keenory, NP  gabapentin (NEURONTIN) 100 MG capsule TAKE 1 CAPSULE BY MOUTH THREE TIMES A DAY 07/13/19  Yes Nafziger, Kandee Keenory, NP  meloxicam (MOBIC) 15 MG tablet Take 1 tablet (  15 mg total) by mouth daily. 11/05/18  Yes Nafziger, [name], NP  Multiple Vitamin (MULTIVITAMIN) tablet Take 1 tablet by mouth daily. Vitamin supplement. 02/07/12  Yes Viviann Spare, FNP  pantoprazole (PROTONIX) 40 MG tablet Take 1 tablet (40 mg total) by mouth 2 (two) times daily. 08/31/18  Yes Tressia Danas, MD  sertraline (ZOLOFT) 100 MG tablet TAKE 1 TABLET BY MOUTH EVERYDAY AT BEDTIME 07/16/19  Yes Nafziger, [name], NP  sucralfate (CARAFATE) 1 g tablet Take 1 tablet (1 g total) by mouth 4 (four) times daily. Dissolve tablet in 10 ml warm water Patient taking differently: Take 1 g by mouth 4 (four) times daily as needed (heartburn). Dissolve tablet in 10 ml warm water 08/31/18  Yes Beavers, Cala Bradford, MD  tetrahydrozoline 0.05 % ophthalmic solution Place 1 drop into both eyes daily.   Yes [provider]  traMADol (ULTRAM) 50 MG tablet Take 1 tablet (50 mg total) by mouth every 12 (twelve) hours as needed. 06/29/19 07/29/19 Yes Nafziger, [name], NP    Family History Family History  Problem Relation Age of Onset   Kidney  disease Mother    Hypertension Mother    Heart disease Father 68       MI   Suicidality Son    Heart disease Brother    Heart attack Brother    Colon cancer Neg Hx    Esophageal cancer Neg Hx    Pancreatic cancer Neg Hx    Rectal cancer Neg Hx    Stomach cancer Neg Hx     Social History Social History   Tobacco Use   Smoking status: Never Smoker   Smokeless tobacco: Never Used  Substance Use Topics   Alcohol use: Never    Frequency: Never   Drug use: Never     Allergies   Morphine and Tape   Review of Systems Review of Systems  Constitutional: Negative for chills, diaphoresis and fever.  Eyes: Negative for redness.  Respiratory: Positive for cough (Occasional, mild) and shortness of breath.   Cardiovascular: Positive for chest pain. Negative for palpitations and leg swelling.  Gastrointestinal: Negative for abdominal pain, diarrhea, nausea and vomiting.  Genitourinary: Negative for dysuria.  Musculoskeletal: Negative for back pain and neck pain.  Skin: Negative for rash.  Neurological: Negative for syncope and light-headedness.  Psychiatric/Behavioral: The patient is not nervous/anxious.      Physical Exam Updated Vital Signs BP (!) 147/98 (BP Location: Right Arm)    Pulse 80    Temp 98.6 F (37 C) (Oral)    Resp 20    SpO2 96%   Physical Exam Vitals signs and nursing note reviewed.  Constitutional:      Appearance: She is well-developed. She is not diaphoretic.  HENT:     Head: Normocephalic and atraumatic.     Mouth/Throat:     Mouth: Mucous membranes are not dry.  Eyes:     Conjunctiva/sclera: Conjunctivae normal.  Neck:     Musculoskeletal: Normal range of motion and neck supple. No muscular tenderness.     Vascular: Normal carotid pulses. No carotid bruit or JVD.     Trachea: Trachea normal. No tracheal deviation.  Cardiovascular:     Rate and Rhythm: Normal rate and regular rhythm.     Pulses: No decreased pulses.     Heart  sounds: Normal heart sounds, S1 normal and S2 normal. No murmur.  Pulmonary:     Effort: Pulmonary effort is normal. Tachypnea present. No respiratory distress.  Breath sounds: Decreased breath sounds present. No wheezing.  Chest:     Chest wall: No tenderness.  Abdominal:     General: Bowel sounds are normal.     Palpations: Abdomen is soft.     Tenderness: There is no abdominal tenderness. There is no guarding or rebound.  Musculoskeletal: Normal range of motion.  Skin:    General: Skin is warm and dry.     Coloration: Skin is not pale.  Neurological:     Mental Status: She is alert.      ED Treatments / Results  Labs (all labs ordered are listed, but only abnormal results are displayed) Labs Reviewed  CBC WITH DIFFERENTIAL/PLATELET - Abnormal; Notable for the following components:      Result Value   RBC 3.30 (*)    Hemoglobin 9.4 (*)    HCT 29.4 (*)    All other components within normal limits  BRAIN NATRIURETIC PEPTIDE - Abnormal; Notable for the following components:   B Natriuretic Peptide 723.7 (*)    All other components within normal limits  COMPREHENSIVE METABOLIC PANEL - Abnormal; Notable for the following components:   Potassium 3.4 (*)    Glucose, Bld 108 (*)    Calcium 8.8 (*)    Total Protein 6.2 (*)    Albumin 3.4 (*)    All other components within normal limits  D-DIMER, QUANTITATIVE (NOT AT [facility]) - Abnormal; Notable for the following components:   D-Dimer, Quant 1.74 (*)    All other components within normal limits  POCT I-STAT EG7 - Abnormal; Notable for the following components:   pO2, Ven 55.0 (*)    Bicarbonate 29.2 (*)    Acid-Base Excess 3.0 (*)    Potassium 3.3 (*)    HCT 30.0 (*)    Hemoglobin 10.2 (*)    All other components within normal limits  SARS CORONAVIRUS 2 (HOSPITAL ORDER, [facility])  I-STAT VENOUS BLOOD GAS, ED  TROPONIN I (HIGH SENSITIVITY)  TROPONIN I (HIGH SENSITIVITY)    ED ECG REPORT    Date: 07/22/2019  Rate: 82  Rhythm: normal sinus rhythm  QRS Axis: left  Intervals: normal  ST/T Wave abnormalities: nonspecific ST changes  Conduction Disutrbances:left bundle branch block  Narrative Interpretation:   Old EKG Reviewed: unchanged  I have personally reviewed the EKG tracing and agree with the computerized printout as noted.  Radiology Ct Angio Chest Pe W And/or Wo Contrast  Result Date: 07/22/2019 CLINICAL DATA:  Shortness of breath and chest pain EXAM: CT ANGIOGRAPHY CHEST WITH CONTRAST TECHNIQUE: Multidetector CT imaging of the chest was performed using the standard protocol during bolus administration of intravenous contrast. Multiplanar CT image reconstructions and MIPs were obtained to evaluate the vascular anatomy. CONTRAST:  158mL OMNIPAQUE IOHEXOL 350 MG/ML SOLN COMPARISON:  Chest radiograph July 22, 2019 and CT angiogram chest February 04, 2019; high-resolution chest CT June 01, 2019 FINDINGS: Cardiovascular: There is no demonstrable pulmonary embolus. There is no thoracic aortic aneurysm or dissection. The visualized great vessels show occasional foci of calcification. There is aortic atherosclerosis. There are scattered foci of coronary artery calcification. There is no pericardial effusion or pericardial thickening. The main pulmonary outflow tract measures 3.1 cm, prominent. Mediastinum/Nodes: No thyroid lesions are evident. There is a right hilar lymph node measuring 1.4 x 1.4 cm. There is subcarinal adenopathy measuring 2.8 x 2.1 cm. There is an aortopulmonary window lymph node measuring 1.6 x 1.2 cm. Esophagus remains distended with fluid  and air throughout the esophagus. There is a focal hiatal hernia. Lungs/Pleura: There is underlying centrilobular emphysematous change. There are focal pleural effusions bilaterally with atelectatic change in each lung base. There is septal thickening in the lung bases with areas of reticulonodular interstitial prominence in the lower  lobes. There are scattered areas of peribronchovascular ground-glass type appearance, similar to prior studies. Scattered subcentimeter pulmonary nodular lesions overall remain stable. Note that a 6 mm nodular opacity in the left upper lobe posterior segment noted previously is no longer evident. Upper Abdomen: There is reflux of contrast into the inferior vena cava and hepatic veins. There is upper abdominal aortic atherosclerosis. Visualized upper abdominal structures otherwise appear unremarkable. Musculoskeletal: There is extensive arthropathy throughout the lower thoracic region. No blastic or lytic bone lesions are evident. Postoperative change in upper lumbar spine. No chest wall lesions are evident. Review of the MIP images confirms the above findings. IMPRESSION: 1. No demonstrable pulmonary embolus. No thoracic aortic aneurysm or dissection. There is aortic atherosclerosis as well as foci of coronary artery calcification. 2. Prominence of the main pulmonary outflow tract, a finding felt to indicate a degree of pulmonary arterial hypertension. 3. Areas of adenopathy noted with increased lymph node prominence compared to prior studies. Etiology for this adenopathy uncertain. Neoplastic etiology cannot be excluded. 4. Small pleural effusions bilaterally with bibasilar atelectasis. Underlying centrilobular emphysematous change. Areas of reticulonodular interstitial prominence and patchy peribronchovascular ground-glass type appearance. Scattered subcentimeter pulmonary nodules remain overall stable. No frank consolidation. 5. Hiatal hernia present. Esophagus appears distended containing fluid and air. Suspect esophageal motility disorder. 6. Reflux of contrast into the inferior vena cava and hepatic veins likely indicates increased right heart pressure. Aortic Atherosclerosis (ICD10-I70.0) and Emphysema (ICD10-J43.9). Electronically Signed   By: Bretta Bang III M.D.   On: 07/22/2019 14:39   Dg Chest  Portable 1 View  Result Date: 07/22/2019 CLINICAL DATA:  Shortness of breath. EXAM: PORTABLE CHEST 1 VIEW COMPARISON:  Mar 09, 2019 FINDINGS: Enlarged cardiac silhouette. Calcific atherosclerotic disease of the aorta. Lower lobe predominant interstitial and alveolar opacities. Osseous structures are without acute abnormality. Soft tissues are grossly normal. IMPRESSION: Lower lobe predominant interstitial and alveolar opacities may represent mixed pattern pulmonary edema. Electronically Signed   By: Ted Mcalpine M.D.   On: 07/22/2019 10:25    Procedures Procedures (including critical care time)  Medications Ordered in ED Medications  albuterol (VENTOLIN HFA) 108 (90 Base) MCG/ACT inhaler 4 puff (4 puffs Inhalation Given 07/22/19 1212)  furosemide (LASIX) injection 20 mg (has no administration in time range)  ipratropium (ATROVENT HFA) inhaler 2 puff (2 puffs Inhalation Given 07/22/19 1213)  iohexol (OMNIPAQUE) 350 MG/ML injection 100 mL (100 mLs Intravenous Contrast Given 07/22/19 1358)     Initial Impression / Assessment and Plan / ED Course  I have reviewed the triage vital signs and the nursing notes.  Pertinent labs & imaging results that were available during my care of the patient were reviewed by me and considered in my medical decision making (see chart for details).  Clinical Course as of Jul 22 1203  Thu Jul 22, 2019  1033 Patient seen by myself as well as PA.  Briefly this is a 83 year old female with a history of asthma on albuterol at home, presenting to the emergency department complaining of acute on chronic shortness of breath.  She reports that she has chronic dyspnea on exertion shortness of breath can rarely walk more than a length of her house, however she  feels in the past 2 days significantly is worsened.  She is she feels that she cannot take in a deep breath.  She describes epigastric pain and pain radiating to his jaws bilaterally when she tries to take a deep  breath.  She denies any leg swelling or worsening orthopnea.  She denies any weight gain.  She denies any productive cough.  She lives by her self and denies any sick contacts.  She reports she is been tested negative for COVID several times.  She denies any fevers or chills.  She denies any prior history of DVT or PE, estrogen use, prolonged travel, but does report prolonged immobilization in the house.  She denies any prior history of MI or heart failure.  EKG here today demonstrates left bundle branch block which was seen on prior EKG 1 year ago.  She does not smoke.  Will also on physical exam she is well-appearing and satting 100% on room air.  She is taking deep inspirations.  She does have crackles and the left lower lung base.  She has no significant pitting edema.  She has no significant JVD.  Her heart rate is in the 70s and regular.  No work-up for chest pain or shortness of breath including infectious work-up, cardiac enzymes, EKG, will add on d-dimer level.  SARS Coronavirus 2 [facility](Hospital order, [facility]) Nasopharyngeal Nasopharyngeal Swab [MT]  1044 pH, Ven: 7.387 [MT]  1044 pCO2, Ven: 48.5 [MT]    Clinical Course User Index [MT] Trifan, Kermit BaloMatthew J, MD       Patient seen and examined. Work-up initiated. Medications ordered.   Vital signs reviewed and are as follows: BP (!) 147/98 (BP Location: Right Arm)    Pulse 80    Temp 98.6 F (37 C) (Oral)    Resp 20    SpO2 96%   12:05 PM D-dimer elevated, will image chest. Also BNP elevated. No h/o heart failure.   3:16 PM CT personally reviewed.  Patient with new pleural effusions and adenopathy.  With elevated BNP, will give dose of Lasix.  Discussed with patient.  Will admit for further evaluation, echocardiogram.  Patient discussed with and seen previously by Dr. Renaye Rakersrifan.   Patient in agreement with plan.  I spoke with Dr. Ophelia CharterYates who will see patient in the emergency department.  Final Clinical Impressions(s)  / ED Diagnoses   Final diagnoses:  Shortness of breath  Pleural effusion   Patient with worsening dyspnea, new effusions, concern for congestive heart failure.  Unknown etiology.  Admit for treatment and further work-up.  ED Discharge Orders    None       Renne CriglerGeiple, Adaora Mchaney, Cordelia Poche-C 07/22/19 1517    Terald Sleeperrifan, Matthew J, MD 07/22/19 [phone]

## 2019-07-22 NOTE — ED Notes (Signed)
ED TO INPATIENT HANDOFF REPORT  ED Nurse Name and Phone #:  [phone] Odie Sera Name/Age/Gender Julie Rice 83 y.o. female Room/Bed: 039C/039C  Code Status   Code Status: Prior  Home/SNF/Other Home Patient oriented to: self, place, time and situation Is this baseline? Yes   Triage Complete: Triage complete  Chief Complaint SOB-chest pain  Triage Note Sob and cough and cp x 2 days, states sob worse when she lays flatper ems pt had elevaTION IN 2,3 has aiv given 324 asa and . 4 nitro  With out relief   Allergies Allergies  Allergen Reactions  . Morphine Other (See Comments)    Flushing, rash, itching  . Tape Other (See Comments)    Band aides, adhesive tape Redness and pulls skin off    Level of Care/Admitting Diagnosis ED Disposition    ED Disposition Condition Comment   Admit  The patient appears reasonably stabilized for admission considering the current resources, flow, and capabilities available in the ED at this time, and I doubt any other [facility] requiring further screening and/or treatment in the ED prior to admission is  present.       B Medical/Surgery History Past Medical History:  Diagnosis Date  . Anemia   . Anxiety   . Bipolar disorder ([place])   . Blood transfusion 1991   autologous pts own blood given   . CAD (coronary artery disease)   . Cataract   . Chest pain    "@ rest, lying down, w/exertion"  . Chronic back pain    "mostly lower back but I do have upper back pain regularly" (04/06/2018)  . COPD (chronic obstructive pulmonary disease) ([place])   . Depression   . Esophageal stricture   . Fibromyalgia   . GERD (gastroesophageal reflux disease)   . Heart murmur    "slight" (04/06/2018)  . Hiatal hernia   . Hyperlipidemia    Patient denies  . Hypertension   . Internal hemorrhoids   . Lumbago   . Mitral regurgitation   . Osteoarthritis   . Osteoporosis   . Pneumonia ~ 01/2018  . PONV (postoperative nausea and vomiting)    severe ponv, "in  the past" (04/06/2018)  . Rectal bleeding   . Rheumatoid arthritis ([place])   . Spondylosis   . TIA (transient ischemic attack) 2013  . Urinary incontinence    wears depends   Past Surgical History:  Procedure Laterality Date  . ABDOMINAL HYSTERECTOMY  1982  . BACK SURGERY    . BALLOON DILATION N/A 09/21/2018   Procedure: BALLOON DILATION;  Surgeon: Irene Shipper, MD;  Location: [facility];  Service: Endoscopy;  Laterality: N/A;  . BLADDER SUSPENSION  1980's  . BOTOX INJECTION N/A 09/21/2018   Procedure: BOTOX INJECTION;  Surgeon: Irene Shipper, MD;  Location: [facility];  Service: Endoscopy;  Laterality: N/A;  . CATARACT EXTRACTION W/ INTRAOCULAR LENS  IMPLANT, BILATERAL Bilateral 2010  . DILATION AND CURETTAGE OF UTERUS  1961  . ESOPHAGEAL MANOMETRY N/A 03/25/2018   Procedure: ESOPHAGEAL MANOMETRY (EM);  Surgeon: Mauri Pole, MD;  Location: [facility];  Service: Endoscopy;  Laterality: N/A;  . ESOPHAGOGASTRODUODENOSCOPY (EGD) WITH PROPOFOL N/A 04/07/2018   Procedure: ESOPHAGOGASTRODUODENOSCOPY (EGD) WITH PROPOFOL;  Surgeon: Doran Stabler, MD;  Location: [place];  Service: Gastroenterology;  Laterality: N/A;  . ESOPHAGOGASTRODUODENOSCOPY (EGD) WITH PROPOFOL N/A 09/21/2018   Procedure: ESOPHAGOGASTRODUODENOSCOPY (EGD) WITH PROPOFOL;  Surgeon: Irene Shipper, MD;  Location: [facility];  Service: Endoscopy;  Laterality: N/A;  . HERNIA REPAIR    . HIATAL HERNIA REPAIR N/A 08/02/2016   Procedure: LAPAROSCOPIC REPAIR OF LARGE  HIATAL HERNIA;  Surgeon: Glenna FellowsBenjamin Hoxworth, MD;  Location: [facility];  Service: General;  Laterality: N/A;  . JOINT REPLACEMENT    . LAPAROSCOPIC NISSEN FUNDOPLICATION N/A 08/02/2016   Procedure: LAPAROSCOPIC NISSEN FUNDOPLICATION;  Surgeon: Glenna FellowsBenjamin Hoxworth, MD;  Location: [facility];  Service: General;  Laterality: N/A;  . LUMBAR LAMINECTOMY  1990; 1994; 1610;9604; 1997;2000   "I've got 2 stainless steel rods; 6 screws; 2 ray cages"took bone from right hip to put in  back  . TONSILLECTOMY AND ADENOIDECTOMY  1945  . TOTAL KNEE ARTHROPLASTY Right ~ 1996  . TUBAL LIGATION  ~ 1976     A IV Location/Drains/Wounds Patient Lines/Drains/Airways Status   Active Line/Drains/Airways    Name:   Placement date:   Placement time:   Site:   Days:   Peripheral IV 07/22/19 Left Antecubital   07/22/19    0943    Antecubital   less than 1   External Urinary Catheter   07/22/19    1545    -   less than 1          Intake/Output Last 24 hours No intake or output data in the 24 hours ending 07/22/19 1601  Labs/Imaging Results for orders placed or performed during the hospital encounter of 07/22/19 (from the past 48 hour(s))  CBC with Differential     Status: Abnormal   Collection Time: 07/22/19 10:20 AM  Result Value Ref Range   WBC 7.1 4.0 - 10.5 K/uL   RBC 3.30 (L) 3.87 - 5.11 MIL/uL   Hemoglobin 9.4 (L) 12.0 - 15.0 g/dL   HCT 54.029.4 (L) 98.136.0 - 19.146.0 %   MCV 89.1 80.0 - 100.0 fL   MCH 28.5 26.0 - 34.0 pg   MCHC 32.0 30.0 - 36.0 g/dL   RDW 47.814.2 29.511.5 - 62.115.5 %   Platelets 294 150 - 400 K/uL   nRBC 0.0 0.0 - 0.2 %   Neutrophils Relative % 80 %   Neutro Abs 5.6 1.7 - 7.7 K/uL   Lymphocytes Relative 11 %   Lymphs Abs 0.8 0.7 - 4.0 K/uL   Monocytes Relative 5 %   Monocytes Absolute 0.4 0.1 - 1.0 K/uL   Eosinophils Relative 3 %   Eosinophils Absolute 0.2 0.0 - 0.5 K/uL   Basophils Relative 1 %   Basophils Absolute 0.1 0.0 - 0.1 K/uL   Immature Granulocytes 0 %   Abs Immature Granulocytes 0.03 0.00 - 0.07 K/uL    Comment: Performed at [facility], [address] [place], [place] [address]  Comprehensive metabolic panel     Status: Abnormal   Collection Time: 07/22/19 10:20 AM  Result Value Ref Range   Sodium 140 135 - 145 mmol/L   Potassium 3.4 (L) 3.5 - 5.1 mmol/L   Chloride 103 98 - 111 mmol/L   CO2 26 22 - 32 mmol/L   Glucose, Bld 108 (H) 70 - 99 mg/dL   BUN 11 8 - 23 mg/dL   Creatinine, Ser 7.840.82 0.44 - 1.00 mg/dL   Calcium 8.8 (L) 8.9 - 10.3  mg/dL   Total Protein 6.2 (L) 6.5 - 8.1 g/dL   Albumin 3.4 (L) 3.5 - 5.0 g/dL   AST 18 15 - 41 U/L   ALT 13 0 - 44 U/L   Alkaline Phosphatase 66 38 - 126 U/L   Total Bilirubin  0.7 0.3 - 1.2 mg/dL   GFR calc non Af Amer >60 >60 mL/min   GFR calc Af Amer >60 >60 mL/min   Anion gap 11 5 - 15    Comment: Performed at [facility], [address] [place], [place] [address]  Troponin I (High Sensitivity)     Status: None   Collection Time: 07/22/19 10:20 AM  Result Value Ref Range   Troponin I (High Sensitivity) 8 <18 ng/L    Comment: (NOTE) Elevated high sensitivity troponin I (hsTnI) values and significant  changes across serial measurements may suggest ACS but many other  chronic and acute conditions are known to elevate hsTnI results.  Refer to the "Links" section for chest pain algorithms and additional  guidance. Performed at [facility], [address] [place], [place] [address]   D-dimer, quantitative (not at [facility])     Status: Abnormal   Collection Time: 07/22/19 10:20 AM  Result Value Ref Range   D-Dimer, Quant 1.74 (H) 0.00 - 0.50 ug/mL-FEU    Comment: (NOTE) At the manufacturer cut-off of 0.50 ug/mL FEU, this assay has been documented to exclude PE with a sensitivity and negative predictive value of 97 to 99%.  At this time, this assay has not been approved by the FDA to exclude DVT/VTE. Results should be correlated with clinical presentation. Performed at [facility], [address], [place], [place] [address]   Brain natriuretic peptide     Status: Abnormal   Collection Time: 07/22/19 10:21 AM  Result Value Ref Range   B Natriuretic Peptide 723.7 (H) 0.0 - 100.0 pg/mL    Comment: Performed at [facility], [address] [place], [place] [address]  POCT I-Stat EG7     Status: Abnormal   Collection Time: 07/22/19 10:34 AM  Result Value Ref Range   pH, Ven 7.387 7.250 - 7.430   pCO2, Ven 48.5 44.0 - 60.0 mmHg   pO2, Ven 55.0 (H) 32.0 -  45.0 mmHg   Bicarbonate 29.2 (H) 20.0 - 28.0 mmol/L   TCO2 31 22 - 32 mmol/L   O2 Saturation 87.0 %   Acid-Base Excess 3.0 (H) 0.0 - 2.0 mmol/L   Sodium 141 135 - 145 mmol/L   Potassium 3.3 (L) 3.5 - 5.1 mmol/L   Calcium, Ion 1.19 1.15 - 1.40 mmol/L   HCT 30.0 (L) 36.0 - 46.0 %   Hemoglobin 10.2 (L) 12.0 - 15.0 g/dL   Patient temperature HIDE    Sample type VENOUS   Troponin I (High Sensitivity)     Status: None   Collection Time: 07/22/19 11:51 AM  Result Value Ref Range   Troponin I (High Sensitivity) 8 <18 ng/L    Comment: (NOTE) Elevated high sensitivity troponin I (hsTnI) values and significant  changes across serial measurements may suggest ACS but many other  chronic and acute conditions are known to elevate hsTnI results.  Refer to the "Links" section for chest pain algorithms and additional  guidance. Performed at [facility], [address] [place], [place] [address]   SARS Coronavirus 2 [facility] order, Performed in [facility] hospital lab) Nasopharyngeal Nasopharyngeal Swab     Status: None   Collection Time: 07/22/19 12:13 PM   Specimen: Nasopharyngeal Swab  Result Value Ref Range   SARS Coronavirus 2 NEGATIVE NEGATIVE    Comment: (NOTE) If result is NEGATIVE SARS-CoV-2 target nucleic acids are NOT DETECTED. The SARS-CoV-2 RNA is generally detectable  in upper and lower  respiratory specimens during the acute phase of infection. The lowest  concentration of SARS-CoV-2 viral copies this assay can detect is 250  copies / mL. A negative result does not preclude SARS-CoV-2 infection  and should not be used as the sole basis for treatment or other  patient management decisions.  A negative result may occur with  improper specimen collection / handling, submission of specimen other  than nasopharyngeal swab, presence of viral mutation(s) within the  areas targeted by this assay, and inadequate number of viral copies  (<250 copies / mL). A negative result must  be combined with clinical  observations, patient history, and epidemiological information. If result is POSITIVE SARS-CoV-2 target nucleic acids are DETECTED. The SARS-CoV-2 RNA is generally detectable in upper and lower  respiratory specimens dur ing the acute phase of infection.  Positive  results are indicative of active infection with SARS-CoV-2.  Clinical  correlation with patient history and other diagnostic information is  necessary to determine patient infection status.  Positive results do  not rule out bacterial infection or co-infection with other viruses. If result is PRESUMPTIVE POSTIVE SARS-CoV-2 nucleic acids MAY BE PRESENT.   A presumptive positive result was obtained on the submitted specimen  and confirmed on repeat testing.  While 2019 novel coronavirus  (SARS-CoV-2) nucleic acids may be present in the submitted sample  additional confirmatory testing may be necessary for epidemiological  and / or clinical management purposes  to differentiate between  SARS-CoV-2 and other Sarbecovirus currently known to infect humans.  If clinically indicated additional testing with an alternate test  methodology [phone]) is advised. The SARS-CoV-2 RNA is generally  detectable in upper and lower respiratory sp ecimens during the acute  phase of infection. The expected result is Negative. Fact Sheet for Patients:  BoilerBrush.com.cy Fact Sheet for Healthcare Providers: https://pope.com/ This test is not yet approved or cleared by the [place] FDA and has been authorized for detection and/or diagnosis of SARS-CoV-2 by FDA under an Emergency Use Authorization (EUA).  This EUA will remain in effect (meaning this test can be used) for the duration of the COVID-19 declaration under Section 564(b)(1) of the Act, 21 U.S.C. section 360bbb-3(b)(1), unless the authorization is terminated or revoked sooner. Performed at [facility], [address], [place], [place] [address]    Ct Angio Chest Pe W And/or Wo Contrast  Result Date: 07/22/2019 CLINICAL DATA:  Shortness of breath and chest pain EXAM: CT ANGIOGRAPHY CHEST WITH CONTRAST TECHNIQUE: Multidetector CT imaging of the chest was performed using the standard protocol during bolus administration of intravenous contrast. Multiplanar CT image reconstructions and MIPs were obtained to evaluate the vascular anatomy. CONTRAST:  OMNIPAQUE IOHEXOL 350 MG/ML SOLN COMPARISON:  Chest radiograph July 22, 2019 and CT angiogram chest February 04, 2019; high-resolution chest CT June 01, 2019 FINDINGS: Cardiovascular: There is no demonstrable pulmonary embolus. There is no thoracic aortic aneurysm or dissection. The visualized great vessels show occasional foci of calcification. There is aortic atherosclerosis. There are scattered foci of coronary artery calcification. There is no pericardial effusion or pericardial thickening. The main pulmonary outflow tract measures 3.1 cm, prominent. Mediastinum/Nodes: No thyroid lesions are evident. There is a right hilar lymph node measuring 1.4 x 1.4 cm. There is subcarinal adenopathy measuring 2.8 x 2.1 cm. There is an aortopulmonary window lymph node measuring 1.6 x 1.2 cm. Esophagus remains distended with fluid and air throughout the esophagus. There is a focal  hiatal hernia. Lungs/Pleura: There is underlying centrilobular emphysematous change. There are focal pleural effusions bilaterally with atelectatic change in each lung base. There is septal thickening in the lung bases with areas of reticulonodular interstitial prominence in the lower lobes. There are scattered areas of peribronchovascular ground-glass type appearance, similar to prior studies. Scattered subcentimeter pulmonary nodular lesions overall remain stable. Note that a 6 mm nodular opacity in the left upper lobe posterior segment noted previously is no longer evident. Upper  Abdomen: There is reflux of contrast into the inferior vena cava and hepatic veins. There is upper abdominal aortic atherosclerosis. Visualized upper abdominal structures otherwise appear unremarkable. Musculoskeletal: There is extensive arthropathy throughout the lower thoracic region. No blastic or lytic bone lesions are evident. Postoperative change in upper lumbar spine. No chest wall lesions are evident. Review of the MIP images confirms the above findings. IMPRESSION: 1. No demonstrable pulmonary embolus. No thoracic aortic aneurysm or dissection. There is aortic atherosclerosis as well as foci of coronary artery calcification. 2. Prominence of the main pulmonary outflow tract, a finding felt to indicate a degree of pulmonary arterial hypertension. 3. Areas of adenopathy noted with increased lymph node prominence compared to prior studies. Etiology for this adenopathy uncertain. Neoplastic etiology cannot be excluded. 4. Small pleural effusions bilaterally with bibasilar atelectasis. Underlying centrilobular emphysematous change. Areas of reticulonodular interstitial prominence and patchy peribronchovascular ground-glass type appearance. Scattered subcentimeter pulmonary nodules remain overall stable. No frank consolidation. 5. Hiatal hernia present. Esophagus appears distended containing fluid and air. Suspect esophageal motility disorder. 6. Reflux of contrast into the inferior vena cava and hepatic veins likely indicates increased right heart pressure. Aortic Atherosclerosis (ICD10-I70.0) and Emphysema (ICD10-J43.9). Electronically Signed   By: Bretta Bang III M.D.   On: 07/22/2019 14:39   Dg Chest Portable 1 View  Result Date: 07/22/2019 CLINICAL DATA:  Shortness of breath. EXAM: PORTABLE CHEST 1 VIEW COMPARISON:  Mar 09, 2019 FINDINGS: Enlarged cardiac silhouette. Calcific atherosclerotic disease of the aorta. Lower lobe predominant interstitial and alveolar opacities. Osseous structures are  without acute abnormality. Soft tissues are grossly normal. IMPRESSION: Lower lobe predominant interstitial and alveolar opacities may represent mixed pattern pulmonary edema. Electronically Signed   By: Ted Mcalpine M.D.   On: 07/22/2019 10:25    Pending Labs Unresulted Labs (From admission, onward)   None      Vitals/Pain Today's Vitals   07/22/19 1426 07/22/19 1445 07/22/19 1515 07/22/19 1530  BP: (!) 173/90 (!) 176/72 (!) 166/79 (!) 168/68  Pulse: 94 84 93 83  Resp: 20 13 17 16   Temp:      TempSrc:      SpO2: 99% 94% 96% 94%  PainSc:        Isolation Precautions No active isolations  Medications Medications  albuterol (VENTOLIN HFA) 108 (90 Base) MCG/ACT inhaler 4 puff (4 puffs Inhalation Given 07/22/19 1212)  ipratropium (ATROVENT HFA) inhaler 2 puff (2 puffs Inhalation Given 07/22/19 1213)  iohexol (OMNIPAQUE) 350 MG/ML injection 100 mL (100 mLs Intravenous Contrast Given 07/22/19 1358)  furosemide (LASIX) injection 20 mg (20 mg Intravenous Given 07/22/19 1539)    Mobility walks Moderate fall risk   Focused Assessments Pulmonary Assessment Handoff:  Lung sounds: Bilateral Breath Sounds: Diminished L Breath Sounds: Diminished R Breath Sounds: Diminished O2 Device: Room Air        R Recommendations: See Admitting Provider Note  Report given to:   Additional Notes:  Pt is sometimes incontinent.

## 2019-07-22 NOTE — ED Notes (Signed)
Pt complained of worsening shortness of breath while ambulating to bathroom.  SpO2 dropped to 90 while ambulating, then returned to 99 after lying in bed.  She stated her shortness of breath improved after returning to bed.

## 2019-07-23 ENCOUNTER — Observation Stay (HOSPITAL_BASED_OUTPATIENT_CLINIC_OR_DEPARTMENT_OTHER): Payer: Medicare Other

## 2019-07-23 ENCOUNTER — Encounter (HOSPITAL_COMMUNITY): Payer: Self-pay | Admitting: Cardiology

## 2019-07-23 DIAGNOSIS — I340 Nonrheumatic mitral (valve) insufficiency: Secondary | ICD-10-CM | POA: Diagnosis not present

## 2019-07-23 DIAGNOSIS — E039 Hypothyroidism, unspecified: Secondary | ICD-10-CM

## 2019-07-23 DIAGNOSIS — I361 Nonrheumatic tricuspid (valve) insufficiency: Secondary | ICD-10-CM | POA: Diagnosis not present

## 2019-07-23 DIAGNOSIS — R0602 Shortness of breath: Secondary | ICD-10-CM

## 2019-07-23 DIAGNOSIS — R0789 Other chest pain: Secondary | ICD-10-CM

## 2019-07-23 DIAGNOSIS — K224 Dyskinesia of esophagus: Secondary | ICD-10-CM

## 2019-07-23 DIAGNOSIS — I10 Essential (primary) hypertension: Secondary | ICD-10-CM | POA: Diagnosis not present

## 2019-07-23 DIAGNOSIS — I5041 Acute combined systolic (congestive) and diastolic (congestive) heart failure: Secondary | ICD-10-CM

## 2019-07-23 DIAGNOSIS — R0600 Dyspnea, unspecified: Secondary | ICD-10-CM

## 2019-07-23 DIAGNOSIS — E785 Hyperlipidemia, unspecified: Secondary | ICD-10-CM | POA: Diagnosis not present

## 2019-07-23 DIAGNOSIS — R0609 Other forms of dyspnea: Secondary | ICD-10-CM

## 2019-07-23 DIAGNOSIS — R079 Chest pain, unspecified: Secondary | ICD-10-CM

## 2019-07-23 LAB — CBC WITH DIFFERENTIAL/PLATELET
Abs Immature Granulocytes: 0.02 10*3/uL (ref 0.00–0.07)
Basophils Absolute: 0.1 10*3/uL (ref 0.0–0.1)
Basophils Relative: 1 %
Eosinophils Absolute: 0.3 10*3/uL (ref 0.0–0.5)
Eosinophils Relative: 3 %
HCT: 30.2 % — ABNORMAL LOW (ref 36.0–46.0)
Hemoglobin: 9.2 g/dL — ABNORMAL LOW (ref 12.0–15.0)
Immature Granulocytes: 0 %
Lymphocytes Relative: 19 %
Lymphs Abs: 1.8 10*3/uL (ref 0.7–4.0)
MCH: 26.8 pg (ref 26.0–34.0)
MCHC: 30.5 g/dL (ref 30.0–36.0)
MCV: 88 fL (ref 80.0–100.0)
Monocytes Absolute: 0.7 10*3/uL (ref 0.1–1.0)
Monocytes Relative: 8 %
Neutro Abs: 6.4 10*3/uL (ref 1.7–7.7)
Neutrophils Relative %: 69 %
Platelets: 309 10*3/uL (ref 150–400)
RBC: 3.43 MIL/uL — ABNORMAL LOW (ref 3.87–5.11)
RDW: 14.3 % (ref 11.5–15.5)
WBC: 9.3 10*3/uL (ref 4.0–10.5)
nRBC: 0 % (ref 0.0–0.2)

## 2019-07-23 LAB — BASIC METABOLIC PANEL
Anion gap: 12 (ref 5–15)
BUN: 16 mg/dL (ref 8–23)
CO2: 27 mmol/L (ref 22–32)
Calcium: 8.7 mg/dL — ABNORMAL LOW (ref 8.9–10.3)
Chloride: 100 mmol/L (ref 98–111)
Creatinine, Ser: 0.87 mg/dL (ref 0.44–1.00)
GFR calc Af Amer: >60 mL/min (ref >60)
GFR calc non Af Amer: >60 mL/min (ref >60)
Glucose, Bld: 109 mg/dL — ABNORMAL HIGH (ref 70–99)
Potassium: 3.5 mmol/L (ref 3.5–5.1)
Sodium: 139 mmol/L (ref 135–145)

## 2019-07-23 LAB — ECHOCARDIOGRAM COMPLETE
Height: 63 in
Weight: 2243.4 oz

## 2019-07-23 MED ORDER — SACUBITRIL-VALSARTAN 24-26 MG PO TABS
1.0000 | ORAL_TABLET | Freq: Two times a day (BID) | ORAL | Status: DC
  Administered 2019-07-23 – 2019-07-27 (×8): 1 via ORAL
  Filled 2019-07-23 (×8): qty 1

## 2019-07-23 MED ORDER — INFLUENZA VAC A&B SA ADJ QUAD 0.5 ML IM PRSY
0.5000 mL | PREFILLED_SYRINGE | INTRAMUSCULAR | Status: AC
  Administered 2019-07-24: 08:00:00 0.5 mL via INTRAMUSCULAR
  Filled 2019-07-23: qty 0.5

## 2019-07-23 MED ORDER — FUROSEMIDE 10 MG/ML IJ SOLN
40.0000 mg | Freq: Two times a day (BID) | INTRAMUSCULAR | Status: DC
  Administered 2019-07-24 – 2019-07-25 (×4): 40 mg via INTRAVENOUS
  Filled 2019-07-23 (×4): qty 4

## 2019-07-23 MED ORDER — SPIRONOLACTONE 12.5 MG HALF TABLET
12.5000 mg | ORAL_TABLET | Freq: Every day | ORAL | Status: DC
  Administered 2019-07-23 – 2019-07-27 (×4): 12.5 mg via ORAL
  Filled 2019-07-23 (×4): qty 1

## 2019-07-23 MED ORDER — FUROSEMIDE 10 MG/ML IJ SOLN
40.0000 mg | Freq: Every day | INTRAMUSCULAR | Status: DC
  Administered 2019-07-23: 40 mg via INTRAVENOUS
  Filled 2019-07-23: qty 4

## 2019-07-23 NOTE — Progress Notes (Signed)
SATURATION QUALIFICATIONS: (This note is used to comply with regulatory documentation for home oxygen)  Patient Saturations on Room Air at Rest =98%  Patient Saturations on Room Air while Ambulating -92- 94%  Patient Saturations on  2 Liters of oxygen while Ambulating -100%   Please briefly explain why patient needs home oxygen:pt. claimed that she is winded on exertion.

## 2019-07-23 NOTE — Consult Note (Addendum)
Cardiology Consultation:   Patient ID: [name] MRN: [id number]; DOB: [birth year]  Admit date: 07/22/2019 Date of Consult: 07/23/2019  Primary Care Provider: Shirline Frees, NP Primary Cardiologist: Charlton Haws, MD  Primary Electrophysiologist:  None    Patient Profile:   Julie Rice is a 83 y.o. female with a hx of rheumatoid arthritis, mitral regurgitation, hypertension, hyperlipidemia, GERD, esophageal stricture, COPD, CAD, bipolar disorder who is being seen today for the evaluation of CHF at the request of Dr. Sharolyn Douglas.  History of Present Illness:   Julie Rice has CAD listed on her history list, however I do not see any specifics.  She was seen years ago, approximately 1996 by Dr. Eden Emms and noted to have mitral regurgitation.  She was referred by her PCP in May for evaluation in our office by Dr. Eden Emms for chest tightness and irregular pulse.  She had her new patient appointment scheduled for yesterday but the patient presented to the hospital instead.   The patient presented to the hospital yesterday with complaints of weakness and shortness of breath.  She starts off telling me that for the past couple of weeks she has had nausea about every 3 hours.  She is also had epigastric pain that radiates around to the left flank and wakes her up at night.  I am not sure if this is related to the shortness of breath that she has been having.  She states that last summer is when she first noted shortness of breath with minimal exertion.  She says that she had 2 episodes of pneumonia over the fall and winter and has been seeing pulmonology.  She was given albuterol inhalers to use twice daily but these have not helped much.  She says that her shortness of breath is worse in the morning.  She often wakes up in the middle of the night with a nonproductive cough.  She sleeps on 2 pillows.  She has had bilateral ankle puffy edema for many years.  She used to take Lasix but has not taken any  in several years.  Her swelling is worse at the end of the day and has not been worse lately than usually is.  Julie Rice lives alone and does her own light housework and even some light yard work.  She does note shortness of breath with more strenuous exertion.  Occasionally she has fast heart fluttering during exertion that lasts for about 30-45 seconds.  This occurs about once or twice a week.  About 6 weeks ago she noticed that her heart rate went up to 176 (she has a pulse oximeter and uses her blood pressure cuff to monitor her pulse) and her chest was tight and arms were heavy.  She did some deep breathing and her heart rate slowed and her symptoms resolved.  She states that sometimes she throws up a little in her mouth during the night.  Sometimes her breathing is rattling in her throat.  She notes epigastric pain/pressure with exertion like sweeping.  As soon as she stops and rests it resolves, only lasting for a few seconds.   Echocardiogram done today showed EF 35-40% with diffuse hypokinesis, elevated LV end-diastolic pressure, moderate mitral regurgitation, mild aortic regurgitation, mildly elevated pulmonary artery systolic pressure.   Holter monitor x4 days in June showed: Patient had a min HR of 57 bpm, max HR of 193 bpm, and avg HR of 78 bpm. Predominant underlying rhythm was Sinus Rhythm. Bundle Branch Block/IVCD was present. 8  Supraventricular Tachycardia runs occurred, the run with the fastest interval lasting 5 beats with a max rate of 193 bpm, the longest lasting 13 beats with an avg rate of 124 bpm. Isolated SVEs were occasional (3.0%, 14182), SVE Couplets were rare (<1.0%, 30), and SVE Triplets were rare (<1.0%, 5). Isolated VEs were rare (<1.0%), VE Couplets were rare (<1.0%), and no VE Triplets were present. Ventricular Bigeminy was present.  Heart Pathway Score:     Past Medical History:  Diagnosis Date   Anemia    Anxiety    Bipolar disorder (HCC)    Blood  transfusion 1991   autologous pts own blood given    CAD (coronary artery disease)    Cataract    Chest pain    "@ rest, lying down, w/exertion"   Chronic back pain    "mostly lower back but I do have upper back pain regularly" (04/06/2018)   COPD (chronic obstructive pulmonary disease) (HCC)    Depression    Esophageal stricture    Fibromyalgia    GERD (gastroesophageal reflux disease)    Heart murmur    "slight" (04/06/2018)   Hiatal hernia    Hyperlipidemia    Patient denies   Hypertension    Internal hemorrhoids    Lumbago    Mitral regurgitation    Osteoarthritis    Osteoporosis    Pneumonia ~ 01/2018   PONV (postoperative nausea and vomiting)    severe ponv, "in the past" (04/06/2018)   Rectal bleeding    Rheumatoid arthritis (HCC)    Spondylosis    TIA (transient ischemic attack) 2013   Urinary incontinence    wears depends    Past Surgical History:  Procedure Laterality Date   ABDOMINAL HYSTERECTOMY  1982   BACK SURGERY     BALLOON DILATION N/A 09/21/2018   Procedure: BALLOON DILATION;  Surgeon: Hilarie Fredrickson, MD;  Location: [facility];  Service: Endoscopy;  Laterality: N/A;   BLADDER SUSPENSION  1980's   BOTOX INJECTION N/A 09/21/2018   Procedure: BOTOX INJECTION;  Surgeon: Hilarie Fredrickson, MD;  Location: [facility];  Service: Endoscopy;  Laterality: N/A;   CATARACT EXTRACTION W/ INTRAOCULAR LENS  IMPLANT, BILATERAL Bilateral 2010   DILATION AND CURETTAGE OF UTERUS  1961   ESOPHAGEAL MANOMETRY N/A 03/25/2018   Procedure: ESOPHAGEAL MANOMETRY (EM);  Surgeon: Napoleon Form, MD;  Location: [facility];  Service: Endoscopy;  Laterality: N/A;   ESOPHAGOGASTRODUODENOSCOPY (EGD) WITH PROPOFOL N/A 04/07/2018   Procedure: ESOPHAGOGASTRODUODENOSCOPY (EGD) WITH PROPOFOL;  Surgeon: Sherrilyn Rist, MD;  Location[facility];  Service: Gastroenterology;  Laterality: N/A;   ESOPHAGOGASTRODUODENOSCOPY (EGD) WITH PROPOFOL N/A  09/21/2018   Procedure: ESOPHAGOGASTRODUODENOSCOPY (EGD) WITH PROPOFOL;  Surgeon: Hilarie Fredrickson, MD;  Location: [facility];  Service: Endoscopy;  Laterality: N/A;   HERNIA REPAIR     HIATAL HERNIA REPAIR N/A 08/02/2016   Procedure: LAPAROSCOPIC REPAIR OF LARGE  HIATAL HERNIA;  Surgeon: Glenna Fellows, MD;  Location: [facility];  Service: General;  Laterality: N/A;   JOINT REPLACEMENT     LAPAROSCOPIC NISSEN FUNDOPLICATION N/A 08/02/2016   Procedure: LAPAROSCOPIC NISSEN FUNDOPLICATION;  Surgeon: Glenna Fellows, MD;  Location: [facility];  Service: General;  Laterality: N/A;   LUMBAR LAMINECTOMY  1990; 1994; 1694;5038   "I've got 2 stainless steel rods; 6 screws; 2 ray cages"took bone from right hip to put in back   TONSILLECTOMY AND ADENOIDECTOMY  1945   TOTAL KNEE ARTHROPLASTY Right ~ 1996   TUBAL LIGATION  ~  1976     Home Medications:  Prior to Admission medications   Medication Sig Start Date End Date Taking? Authorizing Provider  acetaminophen (TYLENOL) 500 MG tablet Take 1,000 mg by mouth 2 (two) times daily as needed for moderate pain (arthritis).    Yes [provider]  albuterol (VENTOLIN HFA) 108 (90 Base) MCG/ACT inhaler Inhale 2 puffs into the lungs every 6 (six) hours as needed for wheezing or shortness of breath. 02/23/19  Yes [name], Tammy S, NP  amLODipine (NORVASC) 2.5 MG tablet Take 2.5 mg by mouth daily.   Yes [provider]  aspirin 81 MG tablet Take 1 tablet (81 mg total) by mouth daily. For cardiovascular health. 02/07/12  Yes Viviann SpareScott, Margaret A, FNP  carvedilol (COREG) 3.125 MG tablet Take 1 tablet (3.125 mg total) by mouth 2 (two) times daily with a meal. 05/11/19  Yes Nafziger, Kandee Keenory, NP  clonazePAM (KLONOPIN) 0.5 MG tablet TAKE 1 TABLET BY MOUTH TWICE A DAY AS NEEDED FOR ANXIETY 05/12/19  Yes Nafziger, Kandee Keenory, NP  gabapentin (NEURONTIN) 100 MG capsule TAKE 1 CAPSULE BY MOUTH THREE TIMES A DAY 07/13/19  Yes Nafziger, Kandee Keenory, NP  meloxicam (MOBIC) 15 MG  tablet Take 1 tablet (15 mg total) by mouth daily. 11/05/18  Yes Nafziger, Kandee Keenory, NP  Multiple Vitamin (MULTIVITAMIN) tablet Take 1 tablet by mouth daily. Vitamin supplement. 02/07/12  Yes Viviann SpareScott, Margaret A, FNP  pantoprazole (PROTONIX) 40 MG tablet Take 1 tablet (40 mg total) by mouth 2 (two) times daily. 08/31/18  Yes Tressia DanasBeavers, Kimberly, MD  sertraline (ZOLOFT) 100 MG tablet TAKE 1 TABLET BY MOUTH EVERYDAY AT BEDTIME 07/16/19  Yes Nafziger, Kandee Keenory, NP  sucralfate (CARAFATE) 1 g tablet Take 1 tablet (1 g total) by mouth 4 (four) times daily. Dissolve tablet in 10 ml warm water Patient taking differently: Take 1 g by mouth 4 (four) times daily as needed (heartburn). Dissolve tablet in 10 ml warm water 08/31/18  Yes Beavers, Cala BradfordKimberly, MD  tetrahydrozoline 0.05 % ophthalmic solution Place 1 drop into both eyes daily.   Yes [provider]  traMADol (ULTRAM) 50 MG tablet Take 1 tablet (50 mg total) by mouth every 12 (twelve) hours as needed. 06/29/19 07/29/19 Yes Nafziger, Kandee Keenory, NP    Inpatient Medications: Scheduled Meds:  amLODipine  2.5 mg Oral Daily   aspirin EC  81 mg Oral Daily   carvedilol  3.125 mg Oral BID WC   enoxaparin (LOVENOX) injection  40 mg Subcutaneous Q24H   furosemide  40 mg Intravenous Daily   gabapentin  100 mg Oral TID   [START ON 07/24/2019] influenza vaccine adjuvanted  0.5 mL Intramuscular Tomorrow-1000   pantoprazole (PROTONIX) IV  40 mg Intravenous Q24H   sertraline  100 mg Oral QHS   Continuous Infusions:  sodium chloride     PRN Meds: sodium chloride, acetaminophen, albuterol, clonazePAM, ondansetron (ZOFRAN) IV, sodium chloride flush, sucralfate, traMADol  Allergies:    Allergies  Allergen Reactions   Morphine Other (See Comments)    Flushing, rash, itching   Tape Other (See Comments)    Band aides, adhesive tape Redness and pulls skin off    Social History:   Social History   Socioeconomic History   Marital status: Widowed    Spouse  name: Not on file   Number of children: 2   Years of education: Not on file   Highest education level: Not on file  Occupational History    Employer: RETIRED  Social Needs   Financial  resource strain: Not on file   Food insecurity    Worry: Not on file    Inability: Not on file   Transportation needs    Medical: Not on file    Non-medical: Not on file  Tobacco Use   Smoking status: Never Smoker   Smokeless tobacco: Never Used  Substance and Sexual Activity   Alcohol use: Never    Frequency: Never   Drug use: Never   Sexual activity: Not Currently  Lifestyle   Physical activity    Days per week: Not on file    Minutes per session: Not on file   Stress: Not on file  Relationships   Social connections    Talks on phone: Not on file    Gets together: Not on file    Attends religious service: Not on file    Active member of club or organization: Not on file    Attends meetings of clubs or organizations: Not on file    Relationship status: Not on file   Intimate partner violence    Fear of current or ex partner: Not on file    Emotionally abused: Not on file    Physically abused: Not on file    Forced sexual activity: Not on file  Other Topics Concern   Not on file  Social History Narrative   Retired    Widowed    Family History:    Family History  Problem Relation Age of Onset   Kidney disease Mother    Hypertension Mother    Heart disease Father 81       die of MI at age 51   Suicidality Son    Heart disease Brother    Heart attack Brother    Colon cancer Neg Hx    Esophageal cancer Neg Hx    Pancreatic cancer Neg Hx    Rectal cancer Neg Hx    Stomach cancer Neg Hx      ROS:  Please see the history of present illness.   All other ROS reviewed and negative.     Physical Exam/Data:   Vitals:   07/23/19 0730 07/23/19 1110 07/23/19 1111 07/23/19 1541  BP: (!) 175/81 (!) 155/68  (!) 142/79  Pulse: 79 84  77  Resp: Temp:   98.5 F (36.9 C) 98.7 F (37.1 C)  TempSrc:   Oral Oral  SpO2: 99% 99%  95%  Weight:      Height:        Intake/Output Summary (Last 24 hours) at 07/23/2019 1626 Last data filed at 07/23/2019 1610 Gross per 24 hour  Intake 240 ml  Output 925 ml  Net -685 ml   Last 3 Weights 07/23/2019 07/23/2019 07/07/2019  Weight (lbs) 140 lb 3.4 oz 141 lb 5 oz 144 lb  Weight (kg) 63.6 kg 64.1 kg 65.318 kg     Body mass index is 24.84 kg/m.  General:  Well nourished, well developed, in no acute distress HEENT: normal Lymph: no adenopathy Neck: no JVD Endocrine:  No thryomegaly Vascular: No carotid bruits; FA pulses 2+ bilaterally without bruits  Cardiac:  normal S1, S2; RRR; 1/6 apical murmur Lungs:  clear to auscultation bilaterally except few crackles in the posterior bases Abd: soft, nontender, no hepatomegaly  Ext: Bilateral puffy ankle edema Musculoskeletal:  No deformities, BUE and BLE strength normal and equal Skin: warm and dry  Neuro:  CNs 2-12 intact, no focal abnormalities noted Psych:  Normal affect   EKG:  The EKG was personally reviewed and demonstrates: Sinus rhythm with left bundle branch block Telemetry:  Telemetry was personally reviewed and demonstrates: Sinus rhythm in the 60s-90s with PVCs intermittently frequent, no sustained ectopy  Relevant CV Studies:  Echocardiogram 07/23/2019 IMPRESSIONS  1. Left ventricular ejection fraction, by visual estimation, is 35 to 40% with diffuse hypokinesis.  2. The left ventricle has moderately decreased function. Normal left ventricular size. There is no left ventricular hypertrophy.  3. Elevated left ventricular end-diastolic pressure.  4. Left ventricular diastolic Doppler parameters are consistent with pseudonormalization pattern of LV diastolic filling.  5. Global right ventricle has normal systolic function.The right ventricular size is normal. No increase in right ventricular wall thickness.  6. Left atrial size was  moderately dilated.  7. Right atrial size was mildly dilated.  8. The mitral valve is normal in structure. Moderate mitral valve regurgitation. No evidence of mitral stenosis.  9. The tricuspid valve is normal in structure. Tricuspid valve regurgitation is mild. 10. The aortic valve is normal in structure. Aortic valve regurgitation is mild by color flow Doppler. Structurally normal aortic valve, with no evidence of sclerosis or stenosis. 11. The pulmonic valve was normal in structure. Pulmonic valve regurgitation is mild by color flow Doppler. 12. Mildly elevated pulmonary artery systolic pressure. 13. The inferior vena cava is normal in size with greater than 50% respiratory variability, suggesting right atrial pressure of 3 mmHg. 14. Mild to moderate aortic valve annular calcification.  Holter monitor x4 days in June/2020 showed: Patient had a min HR of 57 bpm, max HR of 193 bpm, and avg HR of 78 bpm. Predominant underlying rhythm was Sinus Rhythm. Bundle Branch Block/IVCD was present. 8 Supraventricular Tachycardia runs occurred, the run with the fastest interval lasting 5 beats with a max rate of 193 bpm, the longest lasting 13 beats with an avg rate of 124 bpm. Isolated SVEs were occasional (3.0%, 14182), SVE Couplets were rare (<1.0%, 30), and SVE Triplets were rare (<1.0%, 5). Isolated VEs were rare (<1.0%), VE Couplets were rare (<1.0%), and no VE Triplets were present. Ventricular Bigeminy was present.   Laboratory Data:  High Sensitivity Troponin:   Recent Labs  Lab 07/22/19 1020 07/22/19 1151  TROPONINIHS 8 8     Chemistry Recent Labs  Lab 07/22/19 1020 07/22/19 1034 07/23/19 0125  NA 140 141 139  K 3.4* 3.3* 3.5  CL 103  --  100  CO2 26  --  27  GLUCOSE 108*  --  109*  BUN 11  --  16  CREATININE 0.82  --  0.87  CALCIUM 8.8*  --  8.7*  GFRNONAA >60  --  >60  GFRAA >60  --  >60  ANIONGAP 11  --  12    Recent Labs  Lab 07/22/19 1020  PROT 6.2*  ALBUMIN 3.4*    AST 18  ALT 13  ALKPHOS 66  BILITOT 0.7   Hematology Recent Labs  Lab 07/22/19 1020 07/22/19 1034 07/23/19 0125  WBC 7.1  --  9.3  RBC 3.30*  --  3.43*  HGB 9.4* 10.2* 9.2*  HCT 29.4* 30.0* 30.2*  MCV 89.1  --  88.0  MCH 28.5  --  26.8  MCHC 32.0  --  30.5  RDW 14.2  --  14.3  PLT 294  --  309   BNP Recent Labs  Lab 07/22/19 1021  BNP 723.7*    DDimer  Recent [employer]  Lab  07/22/19 1020  DDIMER 1.74*     Radiology/Studies:  Ct Angio Chest Pe W And/or Wo Contrast  Result Date: 07/22/2019 CLINICAL DATA:  Shortness of breath and chest pain EXAM: CT ANGIOGRAPHY CHEST WITH CONTRAST TECHNIQUE: Multidetector CT imaging of the chest was performed using the standard protocol during bolus administration of intravenous contrast. Multiplanar CT image reconstructions and MIPs were obtained to evaluate the vascular anatomy. CONTRAST:  12mL OMNIPAQUE IOHEXOL 350 MG/ML SOLN COMPARISON:  Chest radiograph July 22, 2019 and CT angiogram chest February 04, 2019; high-resolution chest CT June 01, 2019 FINDINGS: Cardiovascular: There is no demonstrable pulmonary embolus. There is no thoracic aortic aneurysm or dissection. The visualized great vessels show occasional foci of calcification. There is aortic atherosclerosis. There are scattered foci of coronary artery calcification. There is no pericardial effusion or pericardial thickening. The main pulmonary outflow tract measures 3.1 cm, prominent. Mediastinum/Nodes: No thyroid lesions are evident. There is a right hilar lymph node measuring 1.4 x 1.4 cm. There is subcarinal adenopathy measuring 2.8 x 2.1 cm. There is an aortopulmonary window lymph node measuring 1.6 x 1.2 cm. Esophagus remains distended with fluid and air throughout the esophagus. There is a focal hiatal hernia. Lungs/Pleura: There is underlying centrilobular emphysematous change. There are focal pleural effusions bilaterally with atelectatic change in each lung base. There is septal  thickening in the lung bases with areas of reticulonodular interstitial prominence in the lower lobes. There are scattered areas of peribronchovascular ground-glass type appearance, similar to prior studies. Scattered subcentimeter pulmonary nodular lesions overall remain stable. Note that a 6 mm nodular opacity in the left upper lobe posterior segment noted previously is no longer evident. Upper Abdomen: There is reflux of contrast into the inferior vena cava and hepatic veins. There is upper abdominal aortic atherosclerosis. Visualized upper abdominal structures otherwise appear unremarkable. Musculoskeletal: There is extensive arthropathy throughout the lower thoracic region. No blastic or lytic bone lesions are evident. Postoperative change in upper lumbar spine. No chest wall lesions are evident. Review of the MIP images confirms the above findings. IMPRESSION: 1. No demonstrable pulmonary embolus. No thoracic aortic aneurysm or dissection. There is aortic atherosclerosis as well as foci of coronary artery calcification. 2. Prominence of the main pulmonary outflow tract, a finding felt to indicate a degree of pulmonary arterial hypertension. 3. Areas of adenopathy noted with increased lymph node prominence compared to prior studies. Etiology for this adenopathy uncertain. Neoplastic etiology cannot be excluded. 4. Small pleural effusions bilaterally with bibasilar atelectasis. Underlying centrilobular emphysematous change. Areas of reticulonodular interstitial prominence and patchy peribronchovascular ground-glass type appearance. Scattered subcentimeter pulmonary nodules remain overall stable. No frank consolidation. 5. Hiatal hernia present. Esophagus appears distended containing fluid and air. Suspect esophageal motility disorder. 6. Reflux of contrast into the inferior vena cava and hepatic veins likely indicates increased right heart pressure. Aortic Atherosclerosis (ICD10-I70.0) and Emphysema  (ICD10-J43.9). Electronically Signed   By: Lowella Grip III M.D.   On: 07/22/2019 14:39   Dg Chest Portable 1 View  Result Date: 07/22/2019 CLINICAL DATA:  Shortness of breath. EXAM: PORTABLE CHEST 1 VIEW COMPARISON:  Mar 09, 2019 FINDINGS: Enlarged cardiac silhouette. Calcific atherosclerotic disease of the aorta. Lower lobe predominant interstitial and alveolar opacities. Osseous structures are without acute abnormality. Soft tissues are grossly normal. IMPRESSION: Lower lobe predominant interstitial and alveolar opacities may represent mixed pattern pulmonary edema. Electronically Signed   By: Fidela Salisbury M.D.   On: 07/22/2019 10:25    Assessment and Plan:  Acute combined systolic and diastolic heart failure -Patient presented with weakness and shortness of breath with exertion. She also has some vague epigastric pressure with exertion.  -Echocardiogram done today showed EF 35-40% with diffuse hypokinesis, elevated LV end-diastolic pressure, moderate mitral regurgitation, mild aortic regurgitation, mildly elevated pulmonary artery systolic pressure. -BNP was 723.7.  High-sensitivity troponins were low and flat-8, 8. -Chest x-ray showed lower lobe predominant interstitial and alveolar opacities may represent mixed pattern pulmonary edema. -CTA of the chest was negative for pulmonary embolus.  There was aortic atherosclerosis as well as foci of coronary artery calcification.  Evidence of pulmonary arterial hypertension was also noted.  Adenopathy was noted and neoplastic etiology could not be excluded.  She has small bilateral pleural effusions. -EKG sinus rhythm with left bundle branch block (LBBB was present in 01/2018 but not going back to 2017) -Potassium was down to 3.3.  Renal function was normal with serum creatinine 0.82.  The patient is mildly anemic -Patient was given Lasix 20 mg IV yesterday.  Now started on Lasix 40 mg IV daily. -141 pounds today.  Office note from  02/23/2019 shows weight of 149 pounds -Etiology of her heart failure is unclear, possibly related to tachyarrhythmia versus myocardial ischemia versus pulmonary disease. -Will stop amlodipine, start Entresto 24/26 mg BID and spironolactone 12.5 mg daily. Increase lasix to 40 mg BID.  -Plan for R&L heart cath on Monday. (On for Dr. Katrinka Blazing at 10:30)  -The patient understands that risks include but are not limited to stroke (1 in 1000), death (1 in 1000), kidney failure [usually temporary] (1 in 500), bleeding (1 in 200), allergic reaction [possibly serious] (1 in 200).    Palpitations -Patient complained of palpitations to her PCP.  A 4-day Holter monitor showed intermittent runs of SVT with the longest being 13 beats. -Patient states she has occasional fast heartbeats up to the 170s mostly with exertion that comes down with rest, only lasts 30-45 seconds.   Hypertension -Home medications include amlodipine 2.5 mg daily, carvedilol 3.125 mg twice daily -Blood pressure has been running high 140s-170s/70s to 90s -As above- Will stop amlodipine, start Entresto 24/26 mg BID and spironolactone 12.5 mg daily.  -Monitor BP and adjust meds as needed.   Chronic anemia -Hemoglobin over the last year has been in the 9-10 range.  -Hemoglobin is currently stable.  GERD -Pt is having reflux like symptoms, especially at night. May be contributing to her shortness of breath and nocturnal cough.  -Barium swallow 03/2018- Moderate hiatal hernia and mild GE narrowing likely related to persistent Nissen wrap.  -Pt was scheduled for an out patient EGD on 08/17/19.  -Pt is on protonix 40 mg IV daily.  For questions or updates, please contact [facility] Please consult [email or web address] for contact info under     Signed, Berton Bon, NP  07/23/2019 4:26 PM

## 2019-07-23 NOTE — Progress Notes (Signed)
PROGRESS NOTE  Julie Rice ZOX:[id number] DOB: Nov 09, 1935 DOA: 07/22/2019 PCP: [name], NP  HPI/Recap of past 24 hours: HPI from Dr Maryann Conners is a 83 y.o. female with medical history significant of TIA, HTN; HLD; fibromyalgia with chronic pain; COPD; CAD; and bipolar presenting with SOB.  She reports significant SOB.  She has been weak and SOB.  She had an appointment with [name] and they said she should come to the ER.  She has left flank pain. SOB is worse with ambulation. Pt has nighttime cough, waking her up about 3AM. Cough is nonproductive.  No fevers.  No weight change.  Mild LE edema in the late evening or night.  2 pillow orthopnea.  No PND but cough wakes her up at night.  She uses her inhaler in the AM with some relief. In the ED, pulm edema on CXR, elevated BNP.  Negative COVID.  Patient admitted for further management.    Today, patient still reported chronic cough, shortness of breath, denies any typical chest pain, abdominal pain, nausea/vomiting, diarrhea, fever/chills.  Assessment/Plan: Principal Problem:   Dyspnea Active Problems:   Hypothyroidism   Dyslipidemia   Essential hypertension   Generalized anxiety disorder   Esophageal dysmotility   Asthma  New onset systolic HF Patient with shortness of breath, trace bilateral pedal edema BNP 723 Troponin x 2 negative EKG showed left bundle branch block, which is not new, no PVCs Chest x-ray with pulmonary edema CTA chest showed no PE Echo showed EF of 35 to 40% with diffuse hypokinesis, which is new Start IV Lasix Cardiology consulted Strict I's and O's, daily weights Telemetry, monitor closely  History of possible underlying asthma/COPD No wheezing, not requiring supplemental oxygen currently CTA chest as above Albuterol as needed Supplemental oxygen PRN Follow-up with pulmonology as an outpatient  Elevated d-dimer CTA chest as above Lower extremity Doppler pending  Chronic  normocytic anemia/iron deficiency anemia Hemoglobin at baseline Anemia panel pending If still anemic, may benefit from Feraheme  Esophageal dysmotility Status post fundoplication with post fundoplication dysphasia secondary to dysmotility Has chronic cough Continue Protonix, Carafate Follow-up with GI plan for repeat EGD with Botox and balloon dilation next month or sooner if needed  Hypertension Continue Norvasc, Coreg  Hypothyroidism Continue Synthroid  GAD Continue home regimen          Malnutrition Type:      Malnutrition Characteristics:      Nutrition Interventions:       Estimated body mass index is 24.84 kg/m as calculated from the following:   Height as of this encounter: 5\' 3"  (1.6 m).   Weight as of this encounter: 63.6 kg.     Code Status: Full  Family Communication: None at bedside  Disposition Plan: To be determined   Consultants:  Cardiology  Procedures:  None  Antimicrobials:  None  DVT prophylaxis: Lovenox   Objective: Vitals:   07/23/19 0729 07/23/19 0730 07/23/19 1110 07/23/19 1111  BP:  (!) 175/81 (!) 155/68   Pulse:  79 84   Resp:  14 15   Temp: 98.8 F (37.1 C)   98.5 F (36.9 C)  TempSrc: Oral   Oral  SpO2:  99% 99%   Weight:      Height:        Intake/Output Summary (Last 24 hours) at 07/23/2019 1527 Last data filed at 07/23/2019 0619 Gross per 24 hour  Intake 240 ml  Output 925 ml  Net -685 ml  Filed Weights   07/23/19 0050 07/23/19 0349  Weight: 64.1 kg 63.6 kg    Exam:  General: NAD   Cardiovascular: S1, S2 present  Respiratory:  Bibasilar crackles noted  Abdomen: Soft, nontender, nondistended, bowel sounds present  Musculoskeletal: Trace bilateral pedal edema noted  Skin: Normal  Psychiatry: Normal mood   Data Reviewed: CBC: Recent Labs  Lab 07/22/19 1020 07/22/19 1034 07/23/19 0125  WBC 7.1  --  9.3  NEUTROABS 5.6  --  6.4  HGB 9.4* 10.2* 9.2*  HCT 29.4* 30.0* 30.2*   MCV 89.1  --  88.0  PLT 294  --  309   Basic Metabolic Panel: Recent Labs  Lab 07/22/19 1020 07/22/19 1034 07/23/19 0125  NA 140 141 139  K 3.4* 3.3* 3.5  CL 103  --  100  CO2 26  --  27  GLUCOSE 108*  --  109*  BUN 11  --  16  CREATININE 0.82  --  0.87  CALCIUM 8.8*  --  8.7*   GFR: Estimated Creatinine Clearance: 45.6 mL/min (by C-G formula based on SCr of 0.87 mg/dL). Liver Function Tests: Recent Labs  Lab 07/22/19 1020  AST 18  ALT 13  ALKPHOS 66  BILITOT 0.7  PROT 6.2*  ALBUMIN 3.4*   No results for input(s): LIPASE, AMYLASE in the last 168 hours. No results for input(s): AMMONIA in the last 168 hours. Coagulation Profile: No results for input(s): INR, PROTIME in the last 168 hours. Cardiac Enzymes: No results for input(s): CKTOTAL, CKMB, CKMBINDEX, TROPONINI in the last 168 hours. BNP (last 3 results) Recent Labs    01/12/19 1427  PROBNP 196.0*   HbA1C: No results for input(s): HGBA1C in the last 72 hours. CBG: No results for input(s): GLUCAP in the last 168 hours. Lipid Profile: No results for input(s): CHOL, HDL, LDLCALC, TRIG, CHOLHDL, LDLDIRECT in the last 72 hours. Thyroid Function Tests: No results for input(s): TSH, T4TOTAL, FREET4, T3FREE, THYROIDAB in the last 72 hours. Anemia Panel: No results for input(s): VITAMINB12, FOLATE, FERRITIN, TIBC, IRON, RETICCTPCT in the last 72 hours. Urine analysis:    Component Value Date/Time   COLORURINE STRAW (A) 02/01/2018 1643   APPEARANCEUR CLEAR 02/01/2018 1643   LABSPEC 1.008 02/01/2018 1643   PHURINE 7.0 02/01/2018 1643   GLUCOSEU NEGATIVE 02/01/2018 1643   HGBUR NEGATIVE 02/01/2018 1643   HGBUR negative 07/19/2009 1509   BILIRUBINUR Negative 07/31/2018 1055   KETONESUR NEGATIVE 02/01/2018 1643   PROTEINUR Positive (A) 07/31/2018 1055   PROTEINUR NEGATIVE 02/01/2018 1643   UROBILINOGEN 0.2 07/31/2018 1055   UROBILINOGEN 0.2 02/05/2012 0058   NITRITE Positive 07/31/2018 1055   NITRITE  NEGATIVE 02/01/2018 1643   LEUKOCYTESUR Large (3+) (A) 07/31/2018 1055   Sepsis Labs: @LABRCNTIP (procalcitonin:4,lacticidven:4)  ) Recent Results (from the past 240 hour(s))  SARS Coronavirus 2 [facility] order, Performed in [facility] hospital lab) Nasopharyngeal Nasopharyngeal Swab     Status: None   Collection Time: 07/22/19 12:13 PM   Specimen: Nasopharyngeal Swab  Result Value Ref Range Status   SARS Coronavirus 2 NEGATIVE NEGATIVE Final    Comment: (NOTE) If result is NEGATIVE SARS-CoV-2 target nucleic acids are NOT DETECTED. The SARS-CoV-2 RNA is generally detectable in upper and lower  respiratory specimens during the acute phase of infection. The lowest  concentration of SARS-CoV-2 viral copies this assay can detect is 250  copies / mL. A negative result does not preclude SARS-CoV-2 infection  and should not be used as the sole  basis for treatment or other  patient management decisions.  A negative result may occur with  improper specimen collection / handling, submission of specimen other  than nasopharyngeal swab, presence of viral mutation(s) within the  areas targeted by this assay, and inadequate number of viral copies  (<250 copies / mL). A negative result must be combined with clinical  observations, patient history, and epidemiological information. If result is POSITIVE SARS-CoV-2 target nucleic acids are DETECTED. The SARS-CoV-2 RNA is generally detectable in upper and lower  respiratory specimens dur ing the acute phase of infection.  Positive  results are indicative of active infection with SARS-CoV-2.  Clinical  correlation with patient history and other diagnostic information is  necessary to determine patient infection status.  Positive results do  not rule out bacterial infection or co-infection with other viruses. If result is PRESUMPTIVE POSTIVE SARS-CoV-2 nucleic acids MAY BE PRESENT.   A presumptive positive result was obtained on the submitted  specimen  and confirmed on repeat testing.  While 2019 novel coronavirus  (SARS-CoV-2) nucleic acids may be present in the submitted sample  additional confirmatory testing may be necessary for epidemiological  and / or clinical management purposes  to differentiate between  SARS-CoV-2 and other Sarbecovirus currently known to infect humans.  If clinically indicated additional testing with an alternate test  methodology [phone]) is advised. The SARS-CoV-2 RNA is generally  detectable in upper and lower respiratory sp ecimens during the acute  phase of infection. The expected result is Negative. Fact Sheet for Patients:  BoilerBrush.com.cy Fact Sheet for Healthcare Providers: https://pope.com/ This test is not yet approved or cleared by the [place] FDA and has been authorized for detection and/or diagnosis of SARS-CoV-2 by FDA under an Emergency Use Authorization (EUA).  This EUA will remain in effect (meaning this test can be used) for the duration of the COVID-19 declaration under Section 564(b)(1) of the Act, 21 U.S.C. section 360bbb-3(b)(1), unless the authorization is terminated or revoked sooner. Performed at [facility], [address] [place], [place] [address]   MRSA PCR Screening     Status: None   Collection Time: 07/22/19  7:59 PM   Specimen: Nasal Mucosa; Nasopharyngeal  Result Value Ref Range Status   MRSA by PCR NEGATIVE NEGATIVE Final    Comment:        The GeneXpert MRSA Assay (FDA approved for NASAL specimens only), is one component of a comprehensive MRSA colonization surveillance program. It is not intended to diagnose MRSA infection nor to guide or monitor treatment for MRSA infections. Performed at [facility], [address], [place], [place] [address]       Studies: No results found.  Scheduled Meds: . amLODipine  2.5 mg Oral Daily  . aspirin EC  81 mg Oral Daily  .  carvedilol  3.125 mg Oral BID WC  . enoxaparin (LOVENOX) injection  40 mg Subcutaneous Q24H  . furosemide  40 mg Intravenous Daily  . gabapentin  100 mg Oral TID  . [START ON 07/24/2019] influenza vaccine adjuvanted  0.5 mL Intramuscular Tomorrow-1000  . pantoprazole (PROTONIX) IV  40 mg Intravenous Q24H  . sertraline  100 mg Oral QHS    Continuous Infusions: . sodium chloride       LOS: 0 days     Briant Cedar, MD [facility]  If 7PM-7AM, please contact night-coverage [email or web address] 07/23/2019, 3:27 PM

## 2019-07-23 NOTE — Progress Notes (Signed)
  Echocardiogram 2D Echocardiogram has been performed.  Julie Rice 07/23/2019, 8:41 AM

## 2019-07-24 ENCOUNTER — Inpatient Hospital Stay (HOSPITAL_COMMUNITY): Payer: Medicare Other

## 2019-07-24 DIAGNOSIS — Z23 Encounter for immunization: Secondary | ICD-10-CM | POA: Diagnosis present

## 2019-07-24 DIAGNOSIS — Z885 Allergy status to narcotic agent status: Secondary | ICD-10-CM | POA: Diagnosis not present

## 2019-07-24 DIAGNOSIS — I509 Heart failure, unspecified: Secondary | ICD-10-CM

## 2019-07-24 DIAGNOSIS — M797 Fibromyalgia: Secondary | ICD-10-CM | POA: Diagnosis present

## 2019-07-24 DIAGNOSIS — R0600 Dyspnea, unspecified: Secondary | ICD-10-CM | POA: Diagnosis not present

## 2019-07-24 DIAGNOSIS — I5043 Acute on chronic combined systolic (congestive) and diastolic (congestive) heart failure: Secondary | ICD-10-CM | POA: Diagnosis not present

## 2019-07-24 DIAGNOSIS — I426 Alcoholic cardiomyopathy: Secondary | ICD-10-CM | POA: Diagnosis not present

## 2019-07-24 DIAGNOSIS — J439 Emphysema, unspecified: Secondary | ICD-10-CM | POA: Diagnosis present

## 2019-07-24 DIAGNOSIS — E876 Hypokalemia: Secondary | ICD-10-CM | POA: Diagnosis present

## 2019-07-24 DIAGNOSIS — I2721 Secondary pulmonary arterial hypertension: Secondary | ICD-10-CM | POA: Diagnosis present

## 2019-07-24 DIAGNOSIS — F319 Bipolar disorder, unspecified: Secondary | ICD-10-CM | POA: Diagnosis present

## 2019-07-24 DIAGNOSIS — Z8673 Personal history of transient ischemic attack (TIA), and cerebral infarction without residual deficits: Secondary | ICD-10-CM | POA: Diagnosis not present

## 2019-07-24 DIAGNOSIS — I5021 Acute systolic (congestive) heart failure: Secondary | ICD-10-CM | POA: Diagnosis present

## 2019-07-24 DIAGNOSIS — K449 Diaphragmatic hernia without obstruction or gangrene: Secondary | ICD-10-CM | POA: Diagnosis present

## 2019-07-24 DIAGNOSIS — I10 Essential (primary) hypertension: Secondary | ICD-10-CM | POA: Diagnosis not present

## 2019-07-24 DIAGNOSIS — F411 Generalized anxiety disorder: Secondary | ICD-10-CM | POA: Diagnosis present

## 2019-07-24 DIAGNOSIS — R4702 Dysphasia: Secondary | ICD-10-CM | POA: Diagnosis present

## 2019-07-24 DIAGNOSIS — J9611 Chronic respiratory failure with hypoxia: Secondary | ICD-10-CM | POA: Diagnosis present

## 2019-07-24 DIAGNOSIS — K224 Dyskinesia of esophagus: Secondary | ICD-10-CM | POA: Diagnosis present

## 2019-07-24 DIAGNOSIS — I5030 Unspecified diastolic (congestive) heart failure: Secondary | ICD-10-CM

## 2019-07-24 DIAGNOSIS — I110 Hypertensive heart disease with heart failure: Secondary | ICD-10-CM | POA: Diagnosis present

## 2019-07-24 DIAGNOSIS — G8929 Other chronic pain: Secondary | ICD-10-CM | POA: Diagnosis present

## 2019-07-24 DIAGNOSIS — I5041 Acute combined systolic (congestive) and diastolic (congestive) heart failure: Secondary | ICD-10-CM | POA: Diagnosis not present

## 2019-07-24 DIAGNOSIS — R0789 Other chest pain: Secondary | ICD-10-CM | POA: Diagnosis not present

## 2019-07-24 DIAGNOSIS — M7989 Other specified soft tissue disorders: Secondary | ICD-10-CM

## 2019-07-24 DIAGNOSIS — I5022 Chronic systolic (congestive) heart failure: Secondary | ICD-10-CM | POA: Diagnosis not present

## 2019-07-24 DIAGNOSIS — I471 Supraventricular tachycardia: Secondary | ICD-10-CM | POA: Diagnosis not present

## 2019-07-24 DIAGNOSIS — Z20828 Contact with and (suspected) exposure to other viral communicable diseases: Secondary | ICD-10-CM | POA: Diagnosis present

## 2019-07-24 DIAGNOSIS — D509 Iron deficiency anemia, unspecified: Secondary | ICD-10-CM | POA: Diagnosis present

## 2019-07-24 DIAGNOSIS — E039 Hypothyroidism, unspecified: Secondary | ICD-10-CM | POA: Diagnosis present

## 2019-07-24 DIAGNOSIS — I5032 Chronic diastolic (congestive) heart failure: Secondary | ICD-10-CM

## 2019-07-24 DIAGNOSIS — R599 Enlarged lymph nodes, unspecified: Secondary | ICD-10-CM | POA: Diagnosis present

## 2019-07-24 DIAGNOSIS — E785 Hyperlipidemia, unspecified: Secondary | ICD-10-CM | POA: Diagnosis present

## 2019-07-24 DIAGNOSIS — I429 Cardiomyopathy, unspecified: Secondary | ICD-10-CM | POA: Diagnosis not present

## 2019-07-24 DIAGNOSIS — I447 Left bundle-branch block, unspecified: Secondary | ICD-10-CM | POA: Diagnosis present

## 2019-07-24 DIAGNOSIS — R0602 Shortness of breath: Secondary | ICD-10-CM | POA: Diagnosis present

## 2019-07-24 DIAGNOSIS — I2510 Atherosclerotic heart disease of native coronary artery without angina pectoris: Secondary | ICD-10-CM | POA: Diagnosis present

## 2019-07-24 DIAGNOSIS — Z91048 Other nonmedicinal substance allergy status: Secondary | ICD-10-CM | POA: Diagnosis not present

## 2019-07-24 DIAGNOSIS — J4520 Mild intermittent asthma, uncomplicated: Secondary | ICD-10-CM | POA: Diagnosis not present

## 2019-07-24 DIAGNOSIS — R0609 Other forms of dyspnea: Secondary | ICD-10-CM | POA: Diagnosis not present

## 2019-07-24 LAB — FOLATE: Folate: 24.4 ng/mL (ref >5.9)

## 2019-07-24 LAB — CBC WITH DIFFERENTIAL/PLATELET
Abs Immature Granulocytes: 0.02 10*3/uL (ref 0.00–0.07)
Basophils Absolute: 0.1 10*3/uL (ref 0.0–0.1)
Basophils Relative: 1 %
Eosinophils Absolute: 0.4 10*3/uL (ref 0.0–0.5)
Eosinophils Relative: 5 %
HCT: 29.6 % — ABNORMAL LOW (ref 36.0–46.0)
Hemoglobin: 9.2 g/dL — ABNORMAL LOW (ref 12.0–15.0)
Immature Granulocytes: 0 %
Lymphocytes Relative: 26 %
Lymphs Abs: 1.9 10*3/uL (ref 0.7–4.0)
MCH: 27.5 pg (ref 26.0–34.0)
MCHC: 31.1 g/dL (ref 30.0–36.0)
MCV: 88.6 fL (ref 80.0–100.0)
Monocytes Absolute: 0.6 10*3/uL (ref 0.1–1.0)
Monocytes Relative: 9 %
Neutro Abs: 4.4 10*3/uL (ref 1.7–7.7)
Neutrophils Relative %: 59 %
Platelets: 265 10*3/uL (ref 150–400)
RBC: 3.34 MIL/uL — ABNORMAL LOW (ref 3.87–5.11)
RDW: 14.1 % (ref 11.5–15.5)
WBC: 7.3 10*3/uL (ref 4.0–10.5)
nRBC: 0 % (ref 0.0–0.2)

## 2019-07-24 LAB — BASIC METABOLIC PANEL
Anion gap: 11 (ref 5–15)
BUN: 14 mg/dL (ref 8–23)
CO2: 29 mmol/L (ref 22–32)
Calcium: 8.4 mg/dL — ABNORMAL LOW (ref 8.9–10.3)
Chloride: 98 mmol/L (ref 98–111)
Creatinine, Ser: 0.97 mg/dL (ref 0.44–1.00)
GFR calc Af Amer: >60 mL/min (ref >60)
GFR calc non Af Amer: 55 mL/min — ABNORMAL LOW (ref >60)
Glucose, Bld: 101 mg/dL — ABNORMAL HIGH (ref 70–99)
Potassium: 2.9 mmol/L — ABNORMAL LOW (ref 3.5–5.1)
Sodium: 138 mmol/L (ref 135–145)

## 2019-07-24 LAB — IRON AND TIBC
Iron: 17 ug/dL — ABNORMAL LOW (ref 28–170)
Saturation Ratios: 5 % — ABNORMAL LOW (ref 10.4–31.8)
TIBC: 361 ug/dL (ref 250–450)
UIBC: 344 ug/dL

## 2019-07-24 LAB — FERRITIN: Ferritin: 24 ng/mL (ref 11–307)

## 2019-07-24 LAB — VITAMIN B12: Vitamin B-12: 413 pg/mL (ref 180–914)

## 2019-07-24 MED ORDER — SODIUM CHLORIDE 0.9 % IV SOLN
510.0000 mg | Freq: Once | INTRAVENOUS | Status: AC
  Administered 2019-07-24: 09:00:00 510 mg via INTRAVENOUS
  Filled 2019-07-24: qty 17

## 2019-07-24 MED ORDER — POTASSIUM CHLORIDE CRYS ER 20 MEQ PO TBCR
40.0000 meq | EXTENDED_RELEASE_TABLET | Freq: Two times a day (BID) | ORAL | Status: AC
  Administered 2019-07-24 (×2): 40 meq via ORAL
  Filled 2019-07-24 (×2): qty 2

## 2019-07-24 MED ORDER — FERROUS SULFATE 325 (65 FE) MG PO TABS
325.0000 mg | ORAL_TABLET | Freq: Every day | ORAL | Status: DC
  Administered 2019-07-24 – 2019-07-27 (×3): 325 mg via ORAL
  Filled 2019-07-24 (×3): qty 1

## 2019-07-24 NOTE — Progress Notes (Signed)
VASCULAR LAB PRELIMINARY  PRELIMINARY  PRELIMINARY  PRELIMINARY  Bilateral lower extremity venous duplex completed.    Preliminary report:  See CV proc for preliminary results.   Tanayah Squitieri, RVT 07/24/2019, 11:49 AM

## 2019-07-24 NOTE — Evaluation (Signed)
Physical Therapy Evaluation Patient Details Name: Julie Rice MRN: [id number] DOB: Mar 17, 1936 Today's Date: 07/24/2019   History of Present Illness  Pt is an 83 y.o. female admitted 07/22/19 with c/o several weeks of weakness, SOB and epigastric/chest pain. Worked up for new onset systolic HF. CT negative for PE. Plan for R/L heart cath on 9/21. PMH includes RA, HTN, esophageal stricture, COPD, bipolar disorder.    Clinical Impression  Pt presents with an overall decrease in functional mobility secondary to above. PTA, pt independent and lives alone, reports she recently starting with HHPT services. Today, pt able to stand and take steps with min guard; mobility limited by c/o chest discomfort and symptomatic orthostatic hypotension (see values below). Pt motivated to participate but anxious regarding DOE and current functional limitations. Pt would benefit from continued acute PT services to maximize functional mobility and independence prior to d/c with continued HHPT services.    SpO2 92-96% on RA with activity  Orthostatic BPs  Supine 102/65  Sitting 69/56  Standing 75/62  Return to supine 137/117     Follow Up Recommendations Home health PT;Supervision/Assistance - 24 hour    Equipment Recommendations  None recommended by PT    Recommendations for Other Services       Precautions / Restrictions Precautions Precautions: Fall Precaution Comments: Orthostatic hypotension 9/19 Restrictions Weight Bearing Restrictions: No      Mobility  Bed Mobility Overal bed mobility: Independent             General bed mobility comments: Bed flat  Transfers Overall transfer level: Needs assistance Equipment used: None Transfers: Sit to/from Stand Sit to Stand: Supervision         General transfer comment: unable to assess due to dyspnea, CP, and anxiety   Ambulation/Gait Ambulation/Gait assistance: Min guard Gait Distance (Feet): 4 Feet Assistive device: None Gait  Pattern/deviations: Step-to pattern     General Gait Details: Steps towards towards [facility] with min guard, pt reaching to bed rail for support with c/o dizziness and chest discomfort limiting further mobility  Stairs            Wheelchair Mobility    Modified Rankin (Stroke Patients Only)       Balance Overall balance assessment: Needs assistance Sitting-balance support: Feet supported Sitting balance-Leahy Scale: Good Sitting balance - Comments: Indep to don shoes sitting EOB     Standing balance-Leahy Scale: Fair                               Pertinent Vitals/Pain Pain Assessment: Faces Faces Pain Scale: Hurts little more Pain Location: Chest upon standing Pain Descriptors / Indicators: Discomfort Pain Intervention(s): Monitored during session;Limited activity within patient's tolerance    Home Living Family/patient expects to be discharged to:: Private residence Living Arrangements: Alone Available Help at Discharge: Family;Available PRN/intermittently Type of Home: House Home Access: Stairs to enter   Entrance Stairs-Number of Steps: 1 Home Layout: One level Home Equipment: Walker - 2 wheels;Cane - single point Additional Comments: Pt reports she has DME in storage that was her mothers; thinks she has RW and SPC in garage    Prior Function Level of Independence: Independent         Comments: Pt reports she drives and does light yard work      Higher education careers adviser   Dominant Hand: Right    Extremity/Trunk Assessment   Upper Extremity Assessment Upper Extremity Assessment: Generalized weakness  RUE Deficits / Details: bil. UEs tremulous which worsen with dyspnea  LUE Deficits / Details: bil. UEs tremulous which worsen with dyspnea    Lower Extremity Assessment Lower Extremity Assessment: Generalized weakness       Communication   Communication: No difficulties  Cognition Arousal/Alertness: Awake/alert Behavior During Therapy: WFL for  tasks assessed/performed Overall Cognitive Status: Within Functional Limits for tasks assessed                                        General Comments General comments (skin integrity, edema, etc.): Pt with DOE 3/4 - 4/4, HR to 101; BP 100/145, 02 sats 97% and pt c/o light headedness/dizziness when EOB.  She was returned to supine,  RN present.  In supine PB 110/88, and HR 98.      Exercises     Assessment/Plan    PT Assessment Patient needs continued PT services  PT Problem List Decreased strength;Decreased activity tolerance;Decreased balance;Decreased mobility;Cardiopulmonary status limiting activity       PT Treatment Interventions DME instruction;Gait training;Stair training;Functional mobility training;Therapeutic activities;Therapeutic exercise;Balance training;Patient/family education    PT Goals (Current goals can be found in the Care Plan section)  Acute Rehab PT Goals Patient Stated Goal: to feel better  PT Goal Formulation: With patient Time For Goal Achievement: 08/07/19 Potential to Achieve Goals: Good    Frequency Min 3X/week   Barriers to discharge        Co-evaluation               AM-PAC PT "6 Clicks" Mobility  Outcome Measure Help needed turning from your back to your side while in a flat bed without using bedrails?: None Help needed moving from lying on your back to sitting on the side of a flat bed without using bedrails?: None Help needed moving to and from a bed to a chair (including a wheelchair)?: A Little Help needed standing up from a chair using your arms (e.g., wheelchair or bedside chair)?: A Little Help needed to walk in hospital room?: A Little Help needed climbing 3-5 steps with a railing? : A Little 6 Click Score: 20    End of Session   Activity Tolerance: Treatment limited secondary to medical complications (Comment) Patient left: with call bell/phone within reach;in bed;with bed alarm set Nurse Communication:  Mobility status PT Visit Diagnosis: Other abnormalities of gait and mobility (R26.89)    Time: 7322-0254 PT Time Calculation (min) (ACUTE ONLY): 18 min   Charges:   PT Evaluation $PT Eval Moderate Complexity: 1 Mod     Mabeline Caras, PT, DPT Acute Rehabilitation Services  Pager [phone] Office [place] 07/24/2019, 5:06 PM

## 2019-07-24 NOTE — Evaluation (Signed)
Occupational Therapy Evaluation Patient Details Name: Julie Rice MRN: [id number] DOB: Jul 18, 1936 Today's Date: 07/24/2019    History of Present Illness Pt is an 83 y.o. female admitted 07/22/19 with c/o several weeks of weakness, SOB and epigastric/chest pain. Worked up for new onset systolic HF. CT negative for PE. Plan for R/L heart cath on 9/21. PMH includes RA, HTN, esophageal stricture, COPD, bipolar disorder.   Clinical Impression   Pt admitted with above. She demonstrates the below listed deficits and will benefit from continued OT to maximize safety and independence with BADLs.  Eval limited du to abrupt onset of dyspnea, tremors, CP, and feeling of light headedness upon sitting EOB - BP 200/145 with HR 101, and 02 sat 97% on RA.  Pt returned to supine with BP 110/88 (repeatd x 2).  Her ability to engage in ADLs or functional mobility limited, but anticipate she will requires supervision to min A.  She reports she lives alone and was fully independent PTA.  Will follow acutely.       Follow Up Recommendations  No OT follow up;Supervision/Assistance - 24 hour(initially )    Equipment Recommendations  None recommended by OT    Recommendations for Other Services       Precautions / Restrictions Precautions Precautions: Fall      Mobility Bed Mobility Overal bed mobility: Modified Independent                Transfers                 General transfer comment: unable to assess due to dyspnea, CP, and anxiety     Balance Overall balance assessment: Needs assistance Sitting-balance support: Feet supported Sitting balance-Leahy Scale: Good                                     ADL either performed or assessed with clinical judgement   ADL Overall ADL's : Needs assistance/impaired Eating/Feeding: Independent   Grooming: Wash/dry hands;Wash/dry face;Oral care;Brushing hair;Set up;Sitting                                  General ADL Comments: Pt immediatly became dysnpneic with c/o chest pressure/pain while EOB accompanied with increased anxiety.  She was unable to engage in ADLs, but anticipate she will requires supervision - min A      Vision Baseline Vision/History: No visual deficits       Perception     Praxis      Pertinent Vitals/Pain Pain Assessment: Faces Faces Pain Scale: Hurts little more Pain Location: chest pain upon sitting  Pain Descriptors / Indicators: Heaviness;Sore Pain Intervention(s): Monitored during session;Repositioned;Relaxation     Hand Dominance Right   Extremity/Trunk Assessment Upper Extremity Assessment Upper Extremity Assessment: RUE deficits/detail;LUE deficits/detail RUE Deficits / Details: bil. UEs tremulous which worsen with dyspnea  LUE Deficits / Details: bil. UEs tremulous which worsen with dyspnea   Lower Extremity Assessment Lower Extremity Assessment: Defer to PT evaluation       Communication Communication Communication: No difficulties   Cognition Arousal/Alertness: Awake/alert Behavior During Therapy: Anxious Overall Cognitive Status: Within Functional Limits for tasks assessed  General Comments  Pt with DOE 3/4 - 4/4, HR to 101; BP 100/145, 02 sats 97% and pt c/o light headedness/dizziness when EOB.  She was returned to supine,  RN present.  In supine PB 110/88, and HR 98.      Exercises     Shoulder Instructions      Home Living Family/patient expects to be discharged to:: Private residence Living Arrangements: Alone Available Help at Discharge: Family;Available PRN/intermittently Type of Home: House Home Access: Stairs to enter [employer] of Steps: 1   Home Layout: One level     Bathroom Shower/Tub: IT trainer: Standard         Additional Comments: Pt reports she has DME in storage that was her mothers, but did not clarify what  she has available       Prior Functioning/Environment Level of Independence: Independent        Comments: Pt reports she drives and does light yard work         OT Problem List: Decreased strength;Decreased activity tolerance;Decreased knowledge of use of DME or AE;Cardiopulmonary status limiting activity;Pain      OT Treatment/Interventions: Self-care/ADL training;Therapeutic exercise;DME and/or AE instruction;Therapeutic activities;Patient/family education;Balance training    OT Goals(Current goals can be found in the care plan section) Acute Rehab OT Goals Patient Stated Goal: to feel better  OT Goal Formulation: With patient Time For Goal Achievement: 08/07/19 Potential to Achieve Goals: Good ADL Goals Pt Will Perform Grooming: with modified independence;standing Pt Will Perform Upper Body Bathing: with modified independence;standing;sitting Pt Will Perform Lower Body Bathing: with modified independence;sit to/from stand Pt Will Perform Upper Body Dressing: with modified independence;sitting;standing Pt Will Perform Lower Body Dressing: with modified independence;sit to/from stand Pt Will Transfer to Toilet: with modified independence;ambulating;regular height toilet;grab bars Pt Will Perform Toileting - Clothing Manipulation and hygiene: with modified independence;sit to/from stand Pt Will Perform Tub/Shower Transfer: Tub transfer;with modified independence;ambulating;shower seat  OT Frequency: Min 2X/week   Barriers to D/C: Decreased caregiver support          Co-evaluation              AM-PAC OT "6 Clicks" Daily Activity     Outcome Measure Help from another person eating meals?: None Help from another person taking care of personal grooming?: A Little Help from another person toileting, which includes using toliet, bedpan, or urinal?: A Lot Help from another person bathing (including washing, rinsing, drying)?: Total Help from another person to put on and  taking off regular upper body clothing?: Total Help from another person to put on and taking off regular lower body clothing?: Total 6 Click Score: 12   End of Session Nurse Communication: Mobility status  Activity Tolerance: Treatment limited secondary to medical complications (Comment) Patient left: in bed;with call bell/phone within reach;with family/visitor present;with nursing/sitter in room  OT Visit Diagnosis: Dizziness and giddiness (R42)                Time: 6967-8938 OT Time Calculation (min): 33 min Charges:  OT General Charges $OT Visit: 1 Visit OT Evaluation $OT Eval Moderate Complexity: 1 Mod OT Treatments $Therapeutic Activity: 8-22 mins  Jeani Hawking, OTR/L Acute Rehabilitation Services Pager [phone] Office [phone]   Jeani Hawking M 07/24/2019, 3:23 PM

## 2019-07-24 NOTE — Progress Notes (Signed)
PROGRESS NOTE  Julie Rice LFY:[id number] DOB: 12/14/1935 DOA: 07/22/2019 PCP: Shirline Frees, NP  HPI/Recap of past 24 hours: HPI from Dr Cristopher Peru is a 83 y.o. female with medical history significant of TIA, HTN; HLD; fibromyalgia with chronic pain; COPD; CAD; and bipolar presenting with SOB.  She reports significant SOB.  She has been weak and SOB.  She had an appointment with Dr. Eden Emms and they said she should come to the ER.  She has left flank pain. SOB is worse with ambulation. Pt has nighttime cough, waking her up about 3AM. Cough is nonproductive.  No fevers.  No weight change.  Mild LE edema in the late evening or night.  2 pillow orthopnea.  No PND but cough wakes her up at night.  She uses her inhaler in the AM with some relief. In the ED, pulm edema on CXR, elevated BNP.  Negative COVID.  Patient admitted for further management.    Today, patient still complained of cough overnight, still short of breath with some chest pressure around the epigastric region.  Denied any other complaints.  Assessment/Plan: Principal Problem:   Dyspnea Active Problems:   Hypothyroidism   Dyslipidemia   Essential hypertension   Generalized anxiety disorder   Esophageal dysmotility   Asthma   Chest pain of uncertain etiology   SOB (shortness of breath)   CHF (congestive heart failure) (HCC)  New onset systolic HF/cardiomyopathy Patient with shortness of breath, trace bilateral pedal edema BNP 723 Troponin x 2 negative EKG showed left bundle branch block, which is not new, no PVCs Chest x-ray with pulmonary edema CTA chest showed no PE Echo showed EF of 35 to 40% with diffuse hypokinesis, which is new Cardiology consulted, continue IV Lasix, Coreg, aspirin, Entresto, Aldactone Patient is scheduled for right and left heart catheterization on 07/26/2019 Strict I's and O's, daily weights Telemetry, monitor closely  Hypokalemia Replace PRN  History of possible  underlying asthma/COPD No wheezing, not requiring supplemental oxygen currently CTA chest as above Albuterol as needed Supplemental oxygen PRN Follow-up with pulmonology as an outpatient  Elevated d-dimer CTA chest as above Lower extremity Doppler negative  Chronic normocytic anemia/iron deficiency anemia Hemoglobin at baseline Anemia panel showed iron 17, sats 5, TIBC 361, ferritin 24, folate 24, vitamin B12 413 Gave 1 dose of Feraheme, continue supplemental oral iron Daily CBC  Esophageal dysmotility Status post fundoplication with post fundoplication dysphasia secondary to dysmotility Has chronic cough Continue Protonix, Carafate Follow-up with GI plan for repeat EGD with Botox and balloon dilation next month or sooner if needed  Hypertension Continue Norvasc, Coreg  Hypothyroidism Continue Synthroid  GAD Continue home regimen          Malnutrition Type:      Malnutrition Characteristics:      Nutrition Interventions:       Estimated body mass index is 24.53 kg/m as calculated from the following:   Height as of this encounter: 5\' 3"  (1.6 m).   Weight as of this encounter: 62.8 kg.     Code Status: Full  Family Communication: None at bedside  Disposition Plan: To be determined   Consultants:  Cardiology  Procedures:  None  Antimicrobials:  None  DVT prophylaxis: Lovenox   Objective: Vitals:   07/24/19 0735 07/24/19 1051 07/24/19 1300 07/24/19 1538  BP: 136/73 (!) 146/100 112/76 (!) 122/58  Pulse: 71 92 94 66  Resp: 16 17 19 18   Temp: 97.8 F (36.6 C) (!) 97  F (36.1 C)  98 F (36.7 C)  TempSrc: Oral Axillary  Oral  SpO2: 97% 98% 99% 97%  Weight:      Height:        Intake/Output Summary (Last 24 hours) at 07/24/2019 1832 Last data filed at 07/24/2019 1600 Gross per 24 hour  Intake 840 ml  Output 2700 ml  Net -1860 ml   Filed Weights   07/23/19 0050 07/23/19 0349 07/24/19 0500  Weight: 64.1 kg 63.6 kg 62.8 kg      Exam:  General: NAD   Cardiovascular: S1, S2 present  Respiratory:  Bibasilar crackles noted  Abdomen: Soft, nontender, nondistended, bowel sounds present  Musculoskeletal: Trace bilateral pedal edema noted  Skin: Normal  Psychiatry: Normal mood   Data Reviewed: CBC: Recent Labs  Lab 07/22/19 1020 07/22/19 1034 07/23/19 0125 07/24/19 0215  WBC 7.1  --  9.3 7.3  NEUTROABS 5.6  --  6.4 4.4  HGB 9.4* 10.2* 9.2* 9.2*  HCT 29.4* 30.0* 30.2* 29.6*  MCV 89.1  --  88.0 88.6  PLT 294  --  309 716   Basic Metabolic Panel: Recent Labs  Lab 07/22/19 1020 07/22/19 1034 07/23/19 0125 07/24/19 0215  NA 140 141 139 138  K 3.4* 3.3* 3.5 2.9*  CL 103  --  100 98  CO2 26  --  27 29  GLUCOSE 108*  --  109* 101*  BUN 11  --  16 14  CREATININE 0.82  --  0.87 0.97  CALCIUM 8.8*  --  8.7* 8.4*   GFR: Estimated Creatinine Clearance: 37.6 mL/min (by C-G formula based on SCr of 0.97 mg/dL). Liver Function Tests: Recent Labs  Lab 07/22/19 1020  AST 18  ALT 13  ALKPHOS 66  BILITOT 0.7  PROT 6.2*  ALBUMIN 3.4*   No results for input(s): LIPASE, AMYLASE in the last 168 hours. No results for input(s): AMMONIA in the last 168 hours. Coagulation Profile: No results for input(s): INR, PROTIME in the last 168 hours. Cardiac Enzymes: No results for input(s): CKTOTAL, CKMB, CKMBINDEX, TROPONINI in the last 168 hours. BNP (last 3 results) Recent Labs    01/12/19 1427  PROBNP 196.0*   HbA1C: No results for input(s): HGBA1C in the last 72 hours. CBG: No results for input(s): GLUCAP in the last 168 hours. Lipid Profile: No results for input(s): CHOL, HDL, LDLCALC, TRIG, CHOLHDL, LDLDIRECT in the last 72 hours. Thyroid Function Tests: No results for input(s): TSH, T4TOTAL, FREET4, T3FREE, THYROIDAB in the last 72 hours. Anemia Panel: Recent Labs    07/24/19 0215  VITAMINB12 413  FOLATE 24.4  FERRITIN 24  TIBC 361  IRON 17*   Urine analysis:    Component Value  Date/Time   COLORURINE STRAW (A) 02/01/2018 1643   APPEARANCEUR CLEAR 02/01/2018 1643   LABSPEC 1.008 02/01/2018 1643   PHURINE 7.0 02/01/2018 1643   GLUCOSEU NEGATIVE 02/01/2018 1643   HGBUR NEGATIVE 02/01/2018 1643   HGBUR negative 07/19/2009 1509   BILIRUBINUR Negative 07/31/2018 1055   KETONESUR NEGATIVE 02/01/2018 1643   PROTEINUR Positive (A) 07/31/2018 1055   PROTEINUR NEGATIVE 02/01/2018 1643   UROBILINOGEN 0.2 07/31/2018 1055   UROBILINOGEN 0.2 02/05/2012 0058   NITRITE Positive 07/31/2018 1055   NITRITE NEGATIVE 02/01/2018 1643   LEUKOCYTESUR Large (3+) (A) 07/31/2018 1055   Sepsis Labs: @LABRCNTIP (procalcitonin:4,lacticidven:4)  ) Recent Results (from the past 240 hour(s))  SARS Coronavirus 2 [facility] order, Performed in [facility] hospital lab) Nasopharyngeal Nasopharyngeal Swab  Status: None   Collection Time: 07/22/19 12:13 PM   Specimen: Nasopharyngeal Swab  Result Value Ref Range Status   SARS Coronavirus 2 NEGATIVE NEGATIVE Final    Comment: (NOTE) If result is NEGATIVE SARS-CoV-2 target nucleic acids are NOT DETECTED. The SARS-CoV-2 RNA is generally detectable in upper and lower  respiratory specimens during the acute phase of infection. The lowest  concentration of SARS-CoV-2 viral copies this assay can detect is 250  copies / mL. A negative result does not preclude SARS-CoV-2 infection  and should not be used as the sole basis for treatment or other  patient management decisions.  A negative result may occur with  improper specimen collection / handling, submission of specimen other  than nasopharyngeal swab, presence of viral mutation(s) within the  areas targeted by this assay, and inadequate number of viral copies  (<250 copies / mL). A negative result must be combined with clinical  observations, patient history, and epidemiological information. If result is POSITIVE SARS-CoV-2 target nucleic acids are DETECTED. The SARS-CoV-2 RNA is generally  detectable in upper and lower  respiratory specimens dur ing the acute phase of infection.  Positive  results are indicative of active infection with SARS-CoV-2.  Clinical  correlation with patient history and other diagnostic information is  necessary to determine patient infection status.  Positive results do  not rule out bacterial infection or co-infection with other viruses. If result is PRESUMPTIVE POSTIVE SARS-CoV-2 nucleic acids MAY BE PRESENT.   A presumptive positive result was obtained on the submitted specimen  and confirmed on repeat testing.  While 2019 novel coronavirus  (SARS-CoV-2) nucleic acids may be present in the submitted sample  additional confirmatory testing may be necessary for epidemiological  and / or clinical management purposes  to differentiate between  SARS-CoV-2 and other Sarbecovirus currently known to infect humans.  If clinically indicated additional testing with an alternate test  methodology [phone]) is advised. The SARS-CoV-2 RNA is generally  detectable in upper and lower respiratory sp ecimens during the acute  phase of infection. The expected result is Negative. Fact Sheet for Patients:  BoilerBrush.com.cy Fact Sheet for Healthcare Providers: https://pope.com/ This test is not yet approved or cleared by the [place] FDA and has been authorized for detection and/or diagnosis of SARS-CoV-2 by FDA under an Emergency Use Authorization (EUA).  This EUA will remain in effect (meaning this test can be used) for the duration of the COVID-19 declaration under Section 564(b)(1) of the Act, 21 U.S.C. section 360bbb-3(b)(1), unless the authorization is terminated or revoked sooner. Performed at [facility], [address] [place], [place] [address]   MRSA PCR Screening     Status: None   Collection Time: 07/22/19  7:59 PM   Specimen: Nasal Mucosa; Nasopharyngeal  Result Value Ref Range  Status   MRSA by PCR NEGATIVE NEGATIVE Final    Comment:        The GeneXpert MRSA Assay (FDA approved for NASAL specimens only), is one component of a comprehensive MRSA colonization surveillance program. It is not intended to diagnose MRSA infection nor to guide or monitor treatment for MRSA infections. Performed at [facility], [address], [place], [place] [address]       Studies: Vas [place] Lower Extremity Venous (dvt)  Result Date: 07/24/2019  Lower Venous Study Indications: Swelling.  Comparison Study: No prior study on file Performing Technologist: Sherren Kerns RVS  Examination Guidelines: A complete evaluation includes B-mode imaging, spectral Doppler,  color Doppler, and power Doppler as needed of all accessible portions of each vessel. Bilateral testing is considered an integral part of a complete examination. Limited examinations for reoccurring indications may be performed as noted.  +---------+---------------+---------+-----------+----------+--------------+  RIGHT     Compressibility Phasicity Spontaneity Properties Thrombus Aging  +---------+---------------+---------+-----------+----------+--------------+  CFV       Full            Yes       Yes                                    +---------+---------------+---------+-----------+----------+--------------+  SFJ       Full                                                             +---------+---------------+---------+-----------+----------+--------------+  FV Prox   Full                                                             +---------+---------------+---------+-----------+----------+--------------+  FV Mid    Full                                                             +---------+---------------+---------+-----------+----------+--------------+  FV Distal Full                                                             +---------+---------------+---------+-----------+----------+--------------+  PFV       Full                                                              +---------+---------------+---------+-----------+----------+--------------+  POP       Full            Yes       Yes                                    +---------+---------------+---------+-----------+----------+--------------+  PTV       Full                                                             +---------+---------------+---------+-----------+----------+--------------+  PERO      Full                                                             +---------+---------------+---------+-----------+----------+--------------+   +---------+---------------+---------+-----------+----------+--------------+  LEFT      Compressibility Phasicity Spontaneity Properties Thrombus Aging  +---------+---------------+---------+-----------+----------+--------------+  CFV       Full            Yes       Yes                                    +---------+---------------+---------+-----------+----------+--------------+  SFJ       Full                                                             +---------+---------------+---------+-----------+----------+--------------+  FV Prox   Full                                                             +---------+---------------+---------+-----------+----------+--------------+  FV Mid    Full                                                             +---------+---------------+---------+-----------+----------+--------------+  FV Distal Full                                                             +---------+---------------+---------+-----------+----------+--------------+  PFV       Full                                                             +---------+---------------+---------+-----------+----------+--------------+  POP       Full            Yes       Yes                                    +---------+---------------+---------+-----------+----------+--------------+  PTV       Full                                                              +---------+---------------+---------+-----------+----------+--------------+  PERO      Full                                                             +---------+---------------+---------+-----------+----------+--------------+  Summary: Right: There is no evidence of deep vein thrombosis in the lower extremity. Left: There is no evidence of deep vein thrombosis in the lower extremity.  *See table(s) above for measurements and observations.    Preliminary     Scheduled Meds:  aspirin EC  81 mg Oral Daily   carvedilol  3.125 mg Oral BID WC   enoxaparin (LOVENOX) injection  40 mg Subcutaneous Q24H   ferrous sulfate  325 mg Oral Q breakfast   furosemide  40 mg Intravenous BID   gabapentin  100 mg Oral TID   pantoprazole (PROTONIX) IV  40 mg Intravenous Q24H   potassium chloride  40 mEq Oral BID   sacubitril-valsartan  1 tablet Oral BID   sertraline  100 mg Oral QHS   spironolactone  12.5 mg Oral Daily    Continuous Infusions:  sodium chloride       LOS: 0 days     Briant CedarNkeiruka J Rheanna Sergent, MD [facility]  If 7PM-7AM, please contact night-coverage [email or web address] 07/24/2019, 6:32 PM

## 2019-07-24 NOTE — Progress Notes (Signed)
Progress Note  Patient Name: Julie Rice Date of Encounter: 07/24/2019  Primary Cardiologist: Charlton Haws, MD  Subjective   States that she did not sleep well last night.  Has had some anxiety today, also shortness of breath and mild chest discomfort.  Inpatient Medications    Scheduled Meds:  aspirin EC  81 mg Oral Daily   carvedilol  3.125 mg Oral BID WC   enoxaparin (LOVENOX) injection  40 mg Subcutaneous Q24H   ferrous sulfate  325 mg Oral Q breakfast   furosemide  40 mg Intravenous BID   gabapentin  100 mg Oral TID   pantoprazole (PROTONIX) IV  40 mg Intravenous Q24H   potassium chloride  40 mEq Oral BID   sacubitril-valsartan  1 tablet Oral BID   sertraline  100 mg Oral QHS   spironolactone  12.5 mg Oral Daily   Continuous Infusions:  sodium chloride     PRN Meds: sodium chloride, acetaminophen, albuterol, clonazePAM, ondansetron (ZOFRAN) IV, sodium chloride flush, sucralfate, traMADol   Vital Signs    Vitals:   07/23/19 2332 07/24/19 0500 07/24/19 0735 07/24/19 1051  BP: 134/65 (!) 156/83 136/73 (!) 146/100  Pulse: 78 81 71 92  Resp: 16 12 16 17   Temp: 97.7 F (36.5 C) 98 F (36.7 C) 97.8 F (36.6 C) (!) 97 F (36.1 C)  TempSrc: Oral Oral Oral Axillary  SpO2: 90% 95% 97% 98%  Weight:  62.8 kg    Height:        Intake/Output Summary (Last 24 hours) at 07/24/2019 1332 Last data filed at 07/24/2019 0735 Gross per 24 hour  Intake 360 ml  Output 1206 ml  Net -846 ml   Filed Weights   07/23/19 0050 07/23/19 0349 07/24/19 0500  Weight: 64.1 kg 63.6 kg 62.8 kg    Telemetry    Sinus rhythm.  Personally reviewed.  ECG    An ECG dated 07/23/2019 was personally reviewed today and demonstrated:  Sinus rhythm with left bundle branch block and PVCs.  Physical Exam   GEN:  Elderly woman, no acute distress.   Neck: No JVD. Cardiac: RRR, soft systolic murmur, no gallop.  Respiratory: Nonlabored. Clear to auscultation bilaterally. GI:  Soft, nontender, bowel sounds present. MS: No edema; No deformity. Neuro:  Nonfocal. Psych: Alert and oriented x 3. Normal affect.  Labs    Chemistry Recent Labs  Lab 07/22/19 1020 07/22/19 1034 07/23/19 0125 07/24/19 0215  NA 140 141 139 138  K 3.4* 3.3* 3.5 2.9*  CL 103  --  100 98  CO2 26  --  27 29  GLUCOSE 108*  --  109* 101*  BUN 11  --  16 14  CREATININE 0.82  --  0.87 0.97  CALCIUM 8.8*  --  8.7* 8.4*  PROT 6.2*  --   --   --   ALBUMIN 3.4*  --   --   --   AST 18  --   --   --   ALT 13  --   --   --   ALKPHOS 66  --   --   --   BILITOT 0.7  --   --   --   GFRNONAA >60  --  >60 55*  GFRAA >60  --  >60 >60  ANIONGAP 11  --  12 11     Hematology Recent Labs  Lab 07/22/19 1020 07/22/19 1034 07/23/19 0125 07/24/19 0215  WBC 7.1  --  9.3  7.3  RBC 3.30*  --  3.43* 3.34*  HGB 9.4* 10.2* 9.2* 9.2*  HCT 29.4* 30.0* 30.2* 29.6*  MCV 89.1  --  88.0 88.6  MCH 28.5  --  26.8 27.5  MCHC 32.0  --  30.5 31.1  RDW 14.2  --  14.3 14.1  PLT 294  --  309 265    Cardiac Enzymes Recent Labs  Lab 07/22/19 1020 07/22/19 1151  TROPONINIHS 8 8    BNP Recent Labs  Lab 07/22/19 1021  BNP 723.7*     DDimer Recent Labs  Lab 07/22/19 1020  DDIMER 1.74*     Radiology    Ct Angio Chest Pe W And/or Wo Contrast  Result Date: 07/22/2019 CLINICAL DATA:  Shortness of breath and chest pain EXAM: CT ANGIOGRAPHY CHEST WITH CONTRAST TECHNIQUE: Multidetector CT imaging of the chest was performed using the standard protocol during bolus administration of intravenous contrast. Multiplanar CT image reconstructions and MIPs were obtained to evaluate the vascular anatomy. CONTRAST:  OMNIPAQUE IOHEXOL 350 MG/ML SOLN COMPARISON:  Chest radiograph July 22, 2019 and CT angiogram chest February 04, 2019; high-resolution chest CT June 01, 2019 FINDINGS: Cardiovascular: There is no demonstrable pulmonary embolus. There is no thoracic aortic aneurysm or dissection. The visualized  great vessels show occasional foci of calcification. There is aortic atherosclerosis. There are scattered foci of coronary artery calcification. There is no pericardial effusion or pericardial thickening. The main pulmonary outflow tract measures 3.1 cm, prominent. Mediastinum/Nodes: No thyroid lesions are evident. There is a right hilar lymph node measuring 1.4 x 1.4 cm. There is subcarinal adenopathy measuring 2.8 x 2.1 cm. There is an aortopulmonary window lymph node measuring 1.6 x 1.2 cm. Esophagus remains distended with fluid and air throughout the esophagus. There is a focal hiatal hernia. Lungs/Pleura: There is underlying centrilobular emphysematous change. There are focal pleural effusions bilaterally with atelectatic change in each lung base. There is septal thickening in the lung bases with areas of reticulonodular interstitial prominence in the lower lobes. There are scattered areas of peribronchovascular ground-glass type appearance, similar to prior studies. Scattered subcentimeter pulmonary nodular lesions overall remain stable. Note that a 6 mm nodular opacity in the left upper lobe posterior segment noted previously is no longer evident. Upper Abdomen: There is reflux of contrast into the inferior vena cava and hepatic veins. There is upper abdominal aortic atherosclerosis. Visualized upper abdominal structures otherwise appear unremarkable. Musculoskeletal: There is extensive arthropathy throughout the lower thoracic region. No blastic or lytic bone lesions are evident. Postoperative change in upper lumbar spine. No chest wall lesions are evident. Review of the MIP images confirms the above findings. IMPRESSION: 1. No demonstrable pulmonary embolus. No thoracic aortic aneurysm or dissection. There is aortic atherosclerosis as well as foci of coronary artery calcification. 2. Prominence of the main pulmonary outflow tract, a finding felt to indicate a degree of pulmonary arterial hypertension. 3.  Areas of adenopathy noted with increased lymph node prominence compared to prior studies. Etiology for this adenopathy uncertain. Neoplastic etiology cannot be excluded. 4. Small pleural effusions bilaterally with bibasilar atelectasis. Underlying centrilobular emphysematous change. Areas of reticulonodular interstitial prominence and patchy peribronchovascular ground-glass type appearance. Scattered subcentimeter pulmonary nodules remain overall stable. No frank consolidation. 5. Hiatal hernia present. Esophagus appears distended containing fluid and air. Suspect esophageal motility disorder. 6. Reflux of contrast into the inferior vena cava and hepatic veins likely indicates increased right heart pressure. Aortic Atherosclerosis (ICD10-I70.0) and Emphysema (ICD10-J43.9). Electronically Signed  By: Bretta Bang III M.D.   On: 07/22/2019 14:39   Vas [place] Lower Extremity Venous (dvt)  Result Date: 07/24/2019  Lower Venous Study Indications: Swelling.  Comparison Study: No prior study on file Performing Technologist: Sherren Kerns RVS  Examination Guidelines: A complete evaluation includes B-mode imaging, spectral Doppler, color Doppler, and power Doppler as needed of all accessible portions of each vessel. Bilateral testing is considered an integral part of a complete examination. Limited examinations for reoccurring indications may be performed as noted.  +---------+---------------+---------+-----------+----------+--------------+  RIGHT     Compressibility Phasicity Spontaneity Properties Thrombus Aging  +---------+---------------+---------+-----------+----------+--------------+  CFV       Full            Yes       Yes                                    +---------+---------------+---------+-----------+----------+--------------+  SFJ       Full                                                             +---------+---------------+---------+-----------+----------+--------------+  FV Prox   Full                                                              +---------+---------------+---------+-----------+----------+--------------+  FV Mid    Full                                                             +---------+---------------+---------+-----------+----------+--------------+  FV Distal Full                                                             +---------+---------------+---------+-----------+----------+--------------+  PFV       Full                                                             +---------+---------------+---------+-----------+----------+--------------+  POP       Full            Yes       Yes                                    +---------+---------------+---------+-----------+----------+--------------+  PTV       Full                                                             +---------+---------------+---------+-----------+----------+--------------+  PERO      Full                                                             +---------+---------------+---------+-----------+----------+--------------+   +---------+---------------+---------+-----------+----------+--------------+  LEFT      Compressibility Phasicity Spontaneity Properties Thrombus Aging  +---------+---------------+---------+-----------+----------+--------------+  CFV       Full            Yes       Yes                                    +---------+---------------+---------+-----------+----------+--------------+  SFJ       Full                                                             +---------+---------------+---------+-----------+----------+--------------+  FV Prox   Full                                                             +---------+---------------+---------+-----------+----------+--------------+  FV Mid    Full                                                             +---------+---------------+---------+-----------+----------+--------------+  FV Distal Full                                                              +---------+---------------+---------+-----------+----------+--------------+  PFV       Full                                                             +---------+---------------+---------+-----------+----------+--------------+  POP       Full            Yes       Yes                                    +---------+---------------+---------+-----------+----------+--------------+  PTV       Full                                                             +---------+---------------+---------+-----------+----------+--------------+  PERO      Full                                                             +---------+---------------+---------+-----------+----------+--------------+     Summary: Right: There is no evidence of deep vein thrombosis in the lower extremity. Left: There is no evidence of deep vein thrombosis in the lower extremity.  *See table(s) above for measurements and observations.    Preliminary     Cardiac Studies   Echocardiogram 07/23/2019:  1. Left ventricular ejection fraction, by visual estimation, is 35 to 40% with diffuse hypokinesis.  2. The left ventricle has moderately decreased function. Normal left ventricular size. There is no left ventricular hypertrophy.  3. Elevated left ventricular end-diastolic pressure.  4. Left ventricular diastolic Doppler parameters are consistent with pseudonormalization pattern of LV diastolic filling.  5. Global right ventricle has normal systolic function.The right ventricular size is normal. No increase in right ventricular wall thickness.  6. Left atrial size was moderately dilated.  7. Right atrial size was mildly dilated.  8. The mitral valve is normal in structure. Moderate mitral valve regurgitation. No evidence of mitral stenosis.  9. The tricuspid valve is normal in structure. Tricuspid valve regurgitation is mild. 10. The aortic valve is normal in structure. Aortic valve regurgitation is mild by color flow Doppler. Structurally normal  aortic valve, with no evidence of sclerosis or stenosis. 11. The pulmonic valve was normal in structure. Pulmonic valve regurgitation is mild by color flow Doppler. 12. Mildly elevated pulmonary artery systolic pressure. 13. The inferior vena cava is normal in size with greater than 50% respiratory variability, suggesting right atrial pressure of 3 mmHg. 14. Mild to moderate aortic valve annular calcification.  Patient Profile     83 y.o. female with a history of rheumatoid arthritis, mitral regurgitation, hypertension, hyperlipidemia, GERD, esophageal stricture, COPD, CAD, and bipolar disorder  presenting with shortness of breath and newly documented cardiomyopathy with LVEF 35 to 40%.  Assessment & Plan    1.  Secondary cardiomyopathy, LVEF 35 to 40% with diffuse hypokinesis by recent echocardiogram.  She presents with acute combined heart failure, no clear evidence of ACS with negative high-sensitivity troponin I levels.  She is scheduled for right and left heart catheterization on Monday.  2.  Intermittent palpitations with recent Holter monitor demonstrating PSVT, brief episodes.  She is on beta-blocker at this time.  3.  Chronic anemia with hemoglobin in the 9-10 range.  No obvious bleeding.  4.  Essential hypertension.  Continue aspirin, Coreg, IV Lasix, potassium supplement, Entresto, and Aldactone.  She is scheduled for a right and left heart catheterization on Monday with Dr. Tamala Julian.  Signed, Rozann Lesches, MD  07/24/2019, 1:32 PM

## 2019-07-24 NOTE — Progress Notes (Addendum)
Pt complained a burning sensation on her throat al the way to diaphragm, and left rib cage hurting. Sucralfate and tramadol given, vitals taken and stable, will monitor before further intervention  Palma Holter, RN

## 2019-07-25 DIAGNOSIS — J4520 Mild intermittent asthma, uncomplicated: Secondary | ICD-10-CM

## 2019-07-25 DIAGNOSIS — J90 Pleural effusion, not elsewhere classified: Secondary | ICD-10-CM

## 2019-07-25 DIAGNOSIS — I5032 Chronic diastolic (congestive) heart failure: Secondary | ICD-10-CM

## 2019-07-25 DIAGNOSIS — I426 Alcoholic cardiomyopathy: Secondary | ICD-10-CM

## 2019-07-25 LAB — BASIC METABOLIC PANEL
Anion gap: 12 (ref 5–15)
Anion gap: 11 (ref 5–15)
BUN: 16 mg/dL (ref 8–23)
BUN: 22 mg/dL (ref 8–23)
CO2: 28 mmol/L (ref 22–32)
CO2: 25 mmol/L (ref 22–32)
Calcium: 8.7 mg/dL — ABNORMAL LOW (ref 8.9–10.3)
Calcium: 8.3 mg/dL — ABNORMAL LOW (ref 8.9–10.3)
Chloride: 98 mmol/L (ref 98–111)
Chloride: 100 mmol/L (ref 98–111)
Creatinine, Ser: 0.97 mg/dL (ref 0.44–1.00)
Creatinine, Ser: 1.44 mg/dL — ABNORMAL HIGH (ref 0.44–1.00)
GFR calc Af Amer: >60 mL/min (ref >60)
GFR calc Af Amer: 39 mL/min — ABNORMAL LOW (ref >60)
GFR calc non Af Amer: 55 mL/min — ABNORMAL LOW (ref >60)
GFR calc non Af Amer: 34 mL/min — ABNORMAL LOW (ref >60)
Glucose, Bld: 99 mg/dL (ref 70–99)
Glucose, Bld: 118 mg/dL — ABNORMAL HIGH (ref 70–99)
Potassium: 3.4 mmol/L — ABNORMAL LOW (ref 3.5–5.1)
Potassium: 4.2 mmol/L (ref 3.5–5.1)
Sodium: 138 mmol/L (ref 135–145)
Sodium: 136 mmol/L (ref 135–145)

## 2019-07-25 LAB — CBC WITH DIFFERENTIAL/PLATELET
Abs Immature Granulocytes: 0.04 10*3/uL (ref 0.00–0.07)
Basophils Absolute: 0.1 10*3/uL (ref 0.0–0.1)
Basophils Relative: 1 %
Eosinophils Absolute: 0.4 10*3/uL (ref 0.0–0.5)
Eosinophils Relative: 4 %
HCT: 32.1 % — ABNORMAL LOW (ref 36.0–46.0)
Hemoglobin: 10.3 g/dL — ABNORMAL LOW (ref 12.0–15.0)
Immature Granulocytes: 0 %
Lymphocytes Relative: 13 %
Lymphs Abs: 1.3 10*3/uL (ref 0.7–4.0)
MCH: 28 pg (ref 26.0–34.0)
MCHC: 32.1 g/dL (ref 30.0–36.0)
MCV: 87.2 fL (ref 80.0–100.0)
Monocytes Absolute: 0.9 10*3/uL (ref 0.1–1.0)
Monocytes Relative: 9 %
Neutro Abs: 7.1 10*3/uL (ref 1.7–7.7)
Neutrophils Relative %: 73 %
Platelets: 272 10*3/uL (ref 150–400)
RBC: 3.68 MIL/uL — ABNORMAL LOW (ref 3.87–5.11)
RDW: 14 % (ref 11.5–15.5)
WBC: 9.9 10*3/uL (ref 4.0–10.5)
nRBC: 0 % (ref 0.0–0.2)

## 2019-07-25 LAB — MAGNESIUM
Magnesium: 1.9 mg/dL (ref 1.7–2.4)
Magnesium: 1.8 mg/dL (ref 1.7–2.4)

## 2019-07-25 LAB — PHOSPHORUS: Phosphorus: 2.9 mg/dL (ref 2.5–4.6)

## 2019-07-25 MED ORDER — ASPIRIN 81 MG PO CHEW
81.0000 mg | CHEWABLE_TABLET | ORAL | Status: AC
  Administered 2019-07-26: 81 mg via ORAL
  Filled 2019-07-25: qty 1

## 2019-07-25 MED ORDER — SODIUM CHLORIDE 0.9 % IV SOLN: 250.0000 mL | INTRAVENOUS | Status: DC | PRN

## 2019-07-25 MED ORDER — SODIUM CHLORIDE 0.9 % IV SOLN
INTRAVENOUS | Status: DC
  Administered 2019-07-26: 06:00:00 1000 mL via INTRAVENOUS

## 2019-07-25 MED ORDER — SODIUM CHLORIDE 0.9% FLUSH
3.0000 mL | Freq: Two times a day (BID) | INTRAVENOUS | Status: DC
  Administered 2019-07-25 – 2019-07-27 (×4): 3 mL via INTRAVENOUS

## 2019-07-25 MED ORDER — ADENOSINE 12 MG/4ML IV SOLN
12.0000 mg | Freq: Once | INTRAVENOUS | Status: AC
  Administered 2019-07-25: 19:00:00 12 mg via INTRAVENOUS
  Filled 2019-07-25: qty 4

## 2019-07-25 MED ORDER — POTASSIUM CHLORIDE CRYS ER 20 MEQ PO TBCR
50.0000 meq | EXTENDED_RELEASE_TABLET | Freq: Once | ORAL | Status: AC
  Administered 2019-07-25: 10:00:00 50 meq via ORAL
  Filled 2019-07-25: qty 2

## 2019-07-25 MED ORDER — ADENOSINE 6 MG/2ML IV SOLN
6.0000 mg | Freq: Once | INTRAVENOUS | Status: AC
  Administered 2019-07-25: 19:00:00 6 mg via INTRAVENOUS

## 2019-07-25 MED ORDER — METOPROLOL TARTRATE 5 MG/5ML IV SOLN: 5.0000 mg | INTRAVENOUS | Status: DC | PRN

## 2019-07-25 MED ORDER — SODIUM CHLORIDE 0.9% FLUSH
3.0000 mL | Freq: Two times a day (BID) | INTRAVENOUS | Status: DC
  Administered 2019-07-25 – 2019-07-26 (×4): 3 mL via INTRAVENOUS

## 2019-07-25 MED ORDER — SODIUM CHLORIDE 0.9% FLUSH: 3.0000 mL | INTRAVENOUS | Status: DC | PRN

## 2019-07-25 NOTE — Progress Notes (Signed)
Progress Note  Patient Name: Julie Rice Date of Encounter: 07/25/2019  Primary Cardiologist: Charlton Haws, MD  Subjective   Rested better last night.  No shortness of breath or chest discomfort at rest, but she does feel symptoms if she moves about.  No orthopnea.  Inpatient Medications    Scheduled Meds: . aspirin EC  81 mg Oral Daily  . carvedilol  3.125 mg Oral BID WC  . enoxaparin (LOVENOX) injection  40 mg Subcutaneous Q24H  . ferrous sulfate  325 mg Oral Q breakfast  . furosemide  40 mg Intravenous BID  . gabapentin  100 mg Oral TID  . pantoprazole (PROTONIX) IV  40 mg Intravenous Q24H  . sacubitril-valsartan  1 tablet Oral BID  . sertraline  100 mg Oral QHS  . spironolactone  12.5 mg Oral Daily   Continuous Infusions: . sodium chloride     PRN Meds: sodium chloride, acetaminophen, albuterol, clonazePAM, ondansetron (ZOFRAN) IV, sodium chloride flush, sucralfate, traMADol   Vital Signs    Vitals:   07/24/19 1922 07/24/19 2312 07/25/19 0420 07/25/19 0744  BP: 108/64 123/67 125/78 (!) 133/93  Pulse: 70 71 79 94  Resp: 17 13 16  (!) 21  Temp: 97.6 F (36.4 C) 98.1 F (36.7 C) 97.8 F (36.6 C) 98.1 F (36.7 C)  TempSrc: Oral Oral Oral Oral  SpO2: 93% 96% 92% 92%  Weight:   63.1 kg   Height:        Intake/Output Summary (Last 24 hours) at 07/25/2019 0756 Last data filed at 07/25/2019 0200 Gross per 24 hour  Intake 720 ml  Output 2450 ml  Net -1730 ml   Filed Weights   07/23/19 0349 07/24/19 0500 07/25/19 0420  Weight: 63.6 kg 62.8 kg 63.1 kg    Telemetry    Sinus rhythm.  Personally reviewed.  ECG    An ECG dated 07/23/2019 was personally reviewed today and demonstrated:  Sinus rhythm with left bundle branch block and PVCs.  Physical Exam   GEN:  Elderly woman, no acute distress.   Neck: No JVD. Cardiac: RRR, soft systolic murmur, rub, or gallop.  Respiratory: Nonlabored. Clear to auscultation bilaterally. GI: Soft, nontender, bowel  sounds present. MS: No edema; No deformity. Neuro:  Nonfocal. Psych: Alert and oriented x 3. Normal affect.  Labs    Chemistry Recent Labs  Lab 07/22/19 1020  07/23/19 0125 07/24/19 0215 07/25/19 0208  NA 140   < > 139 138 138  K 3.4*   < > 3.5 2.9* 3.4*  CL 103  --  100 98 98  CO2 26  --  27 29 28   GLUCOSE 108*  --  109* 101* 99  BUN 11  --  16 14 16   CREATININE 0.82  --  0.87 0.97 0.97  CALCIUM 8.8*  --  8.7* 8.4* 8.7*  PROT 6.2*  --   --   --   --   ALBUMIN 3.4*  --   --   --   --   AST 18  --   --   --   --   ALT 13  --   --   --   --   ALKPHOS 66  --   --   --   --   BILITOT 0.7  --   --   --   --   GFRNONAA >60  --  >60 55* 55*  GFRAA >60  --  >60 >60 >60  ANIONGAP 11  --  12 11 12    < > = values in this interval not displayed.     Hematology Recent Labs  Lab 07/23/19 0125 07/24/19 0215 07/25/19 0208  WBC 9.3 7.3 9.9  RBC 3.43* 3.34* 3.68*  HGB 9.2* 9.2* 10.3*  HCT 30.2* 29.6* 32.1*  MCV 88.0 88.6 87.2  MCH 26.8 27.5 28.0  MCHC 30.5 31.1 32.1  RDW 14.3 14.1 14.0  PLT 309 265 272    Cardiac Enzymes Recent Labs  Lab 07/22/19 1020 07/22/19 1151  TROPONINIHS 8 8    BNP Recent Labs  Lab 07/22/19 1021  BNP 723.7*     DDimer Recent Labs  Lab 07/22/19 1020  DDIMER 1.74*     Radiology    Vas [place] Lower Extremity Venous (dvt)  Result Date: 07/24/2019  Lower Venous Study Indications: Swelling.  Comparison Study: No prior study on file Performing Technologist: Sharion Dove RVS  Examination Guidelines: A complete evaluation includes B-mode imaging, spectral Doppler, color Doppler, and power Doppler as needed of all accessible portions of each vessel. Bilateral testing is considered an integral part of a complete examination. Limited examinations for reoccurring indications may be performed as noted.  +---------+---------------+---------+-----------+----------+--------------+ RIGHT    CompressibilityPhasicitySpontaneityPropertiesThrombus  Aging +---------+---------------+---------+-----------+----------+--------------+ CFV      Full           Yes      Yes                                 +---------+---------------+---------+-----------+----------+--------------+ SFJ      Full                                                        +---------+---------------+---------+-----------+----------+--------------+ FV Prox  Full                                                        +---------+---------------+---------+-----------+----------+--------------+ FV Mid   Full                                                        +---------+---------------+---------+-----------+----------+--------------+ FV DistalFull                                                        +---------+---------------+---------+-----------+----------+--------------+ PFV      Full                                                        +---------+---------------+---------+-----------+----------+--------------+ POP      Full           Yes      Yes                                 +---------+---------------+---------+-----------+----------+--------------+  PTV      Full                                                        +---------+---------------+---------+-----------+----------+--------------+ PERO     Full                                                        +---------+---------------+---------+-----------+----------+--------------+   +---------+---------------+---------+-----------+----------+--------------+ LEFT     CompressibilityPhasicitySpontaneityPropertiesThrombus Aging +---------+---------------+---------+-----------+----------+--------------+ CFV      Full           Yes      Yes                                 +---------+---------------+---------+-----------+----------+--------------+ SFJ      Full                                                         +---------+---------------+---------+-----------+----------+--------------+ FV Prox  Full                                                        +---------+---------------+---------+-----------+----------+--------------+ FV Mid   Full                                                        +---------+---------------+---------+-----------+----------+--------------+ FV DistalFull                                                        +---------+---------------+---------+-----------+----------+--------------+ PFV      Full                                                        +---------+---------------+---------+-----------+----------+--------------+ POP      Full           Yes      Yes                                 +---------+---------------+---------+-----------+----------+--------------+ PTV      Full                                                        +---------+---------------+---------+-----------+----------+--------------+   PERO     Full                                                        +---------+---------------+---------+-----------+----------+--------------+     Summary: Right: There is no evidence of deep vein thrombosis in the lower extremity. Left: There is no evidence of deep vein thrombosis in the lower extremity.  *See table(s) above for measurements and observations.    Preliminary     Cardiac Studies   Echocardiogram 07/23/2019: 1. Left ventricular ejection fraction, by visual estimation, is 35 to 40% with diffuse hypokinesis. 2. The left ventricle has moderately decreased function. Normal left ventricular size. There is no left ventricular hypertrophy. 3. Elevated left ventricular end-diastolic pressure. 4. Left ventricular diastolic Doppler parameters are consistent with pseudonormalization pattern of LV diastolic filling. 5. Global right ventricle has normal systolic function.The right ventricular size is normal. No increase in  right ventricular wall thickness. 6. Left atrial size was moderately dilated. 7. Right atrial size was mildly dilated. 8. The mitral valve is normal in structure. Moderate mitral valve regurgitation. No evidence of mitral stenosis. 9. The tricuspid valve is normal in structure. Tricuspid valve regurgitation is mild. 10. The aortic valve is normal in structure. Aortic valve regurgitation is mild by color flow Doppler. Structurally normal aortic valve, with no evidence of sclerosis or stenosis. 11. The pulmonic valve was normal in structure. Pulmonic valve regurgitation is mild by color flow Doppler. 12. Mildly elevated pulmonary artery systolic pressure. 13. The inferior vena cava is normal in size with greater than 50% respiratory variability, suggesting right atrial pressure of 3 mmHg. 14. Mild to moderate aortic valve annular calcification.  Patient Profile     83 y.o. female with a history of rheumatoid arthritis, mitral regurgitation, hypertension, hyperlipidemia, GERD, esophageal stricture, COPD, CAD, and bipolar disorder presenting with shortness of breath and newly documented cardiomyopathy with LVEF 35 to 40%.  Assessment & Plan    1.  Secondary cardiomyopathy, LVEF 35 to 40% with diffuse hypokinesis.  She presents with acute combined heart failure, no clear evidence of ACS with negative high-sensitivity troponin I levels.  She is diuresing on IV Lasix, approximately 1700 cc out more than in last 24 hours.  Right and left heart catheterization is scheduled for tomorrow.  2.  Intermittent palpitations with recent Holter monitor demonstrating brief episodes of PSVT.  None seen on telemetry so far.  She continues on beta-blocker.  3.  Chronic anemia with hemoglobin in the 9-10 range.  No obvious bleeding.  4.  Essential hypertension.  Plan for right and left heart catheterization tomorrow.  Continue aspirin, Coreg, Lovenox, IV Lasix, Entresto, and Aldactone.  Follow-up CBC and BMET  in a.m.  Signed, Nona Dell, MD  07/25/2019, 7:56 AM

## 2019-07-25 NOTE — Significant Event (Signed)
Rapid Response Event Note  Overview: Time Called: 1822 Arrival Time: 2841 Event Type: Cardiac  Initial Focused Assessment: RN called bc patient in "VT" Upon my arrival patient was in SVT 160s, she complained of pain and SOB and feeling dizzy BP 80/66  SVT 161  RR 18  O2 sat 98% on 2L [place] [facility] sounds clear, decreased bases 12 lead EKG done  Interventions: Placed on Zoll pads Dr Sherral Hammers to bedside 6mg  Adenosine IVP, pt symptomatic but no change in HR 12mg  Adenosine IVP,  HR converted to SR BP 114/57  SR 93  RR 14 12 lead EKG done  Patient tearful but feeling better.  Plan of Care (if not transferred): RN to call if assistance needed  Event Summary: Name of Physician Notified: Dia Crawford at 1825    at    Outcome: Stayed in room and stabalized  Event End Time: 3244  [name]

## 2019-07-25 NOTE — Progress Notes (Addendum)
At 6.20 pt's HR was 170 and sustained. Pt said she felt dizzy, something funny, and having a poor sight. Rapid response and MD notified. Zoll pad attached and monitor started. Pt was alert and oriented times 4 and breathing hard. Oxygen  at 2l/min, pulse ox 96. After rapid and MD arrival, adenosine 6 mg and 12 mg pushed by rapid RN at 6.36pm and 6.42 pm, then pt came back to 93. Current HR is 90. BP 114/57, HR 100. Pt is getting 250 cc bolus now. 12 lead EKG done during and after the adenosine IV.  Will continue to monitor the patient  Palma Holter, RN

## 2019-07-25 NOTE — Progress Notes (Addendum)
PROGRESS NOTE    Julie ChanceDelores C Rice  ZOX:[id number] DOB: 01/01/1936 DOA: 07/22/2019 PCP: [name], [name], NP   Brief Narrative:  83 y.o. WF PMHx  Bipolar, TIA; TA; HTN; HLD; fibromyalgia with chronic pain; COPD; CAD;   Presenting with SOB.  She reports significant SOB.  She has been weak and SOB.  She had an appointment with Dr. Eden EmmsNishan and they said she should come to the ER.  She has left flank pain.  SOB is with ambulation, not bad on short distances.  She has nighttime cough, waking her up about 3AM.  She feels a rattling in her throat without wheezing.  Cough is nonproductive.  No fevers.  No weight change.  Mild LE edema in the late evening or night.  2 pillow orthopnea.  No PND but cough wakes her up at night.  She uses her inhaler in the AM with some relief.   Subjective: 9/20 A/O x4, negative S OB, negative CP, negative abdominal pain, negative N/V.  States last time symptomatic was yesterday when working with PT attempted to ambulate to chair became presyncopal (diaphoretic, lightheaded, feeling of heartburn)   Assessment & Plan:   Principal Problem:   Dyspnea Active Problems:   Hypothyroidism   Dyslipidemia   Essential hypertension   Generalized anxiety disorder   Esophageal dysmotility   Asthma   Chest pain of uncertain etiology   SOB (shortness of breath)   CHF (congestive heart failure) (HCC)   New onset systolic CHF/cardiomyopathy -Strict in and out -3.3 L -Daily weight Filed Weights   07/23/19 0349 07/24/19 0500 07/25/19 0420  Weight: 63.6 kg 62.8 kg 63.1 kg  -Troponin high-sensitivity x2- -EKG LBBB (old) -PCXR; pulmonary edema -CTA negative for PE -Echo; EF 35 to 40% which is new per cardiology see results below -Scheduled for RIGHT/LEFT heart catheterization on 9/21 -ASA 81 mg daily - Coreg 3.125 mg BID - Lasix IV 40 mg BID - Entresto 24-26 mg/tab. BID -Spironolactone 12.5 mg daily  Essential HTN -See CHF  ADDENDUM; called to bedside patient in  sustained VT/PSVT with a rate in the 170s.  Adenosine 6 mg administered without effect.  Additional adenosine 12 mg administered patient return to NSR rate mid 90s. - Metoprolol IV PRN ordered for HR> 95 hold for MAP<65  Hypokalemia - Potassium goal>4 -K-Dur 50 mEq  Hypothyroidism -TSH pending - Not on Synthroid per home medication list  Asthma?/COPD  -Titrate O2 to maintain SPO2 89 to 93% -Albuterol PRN -Emphysematous change on CTA see results below -Upon discharge schedule appointment with pulmonology for spirometry, pre-/post bronchodilator.   Elevated d-dimer -CTA negative for PE -Lower extremity Doppler negative   Chronic normocytic anemia/iron deficiency anemia Hemoglobin at baseline Anemia panel showed iron 17, sats 5, TIBC 361, ferritin 24, folate 24, vitamin B12 413 Gave 1 dose of Feraheme, continue supplemental oral iron Daily CBC  Esophageal dysmotility -Status post fundoplication with post fundoplication dysphasia secondary to dysmotility -Has chronic cough -Continue Protonix, Carafate -Follow-up with GI plan for repeat EGD with Botox and balloon dilation next month or sooner if needed.  If cardiac catheterization negative may want to involve GI while patient hospitalized.  Generalized anxiety disorder/bipolar disorder - Clonazepam 0.5 mg BID PRN -Zoloft 100 mg qhs    DVT prophylaxis: Lovenox Code Status: Full Family Communication: None Disposition Plan: Per cardiology   Consultants:  Cardiology     Procedures/Significant Events:  9/17 CTA PE protocol:No demonstrable pulmonary embolus. No thoracic aortic aneurysm or dissection. There is aortic  atherosclerosis as well as foci of coronary artery calcification.  2. Prominence of the main pulmonary outflow tract, a finding felt to indicate a degree of pulmonary arterial hypertension.  3. Areas of adenopathy noted with increased lymph node prominence compared to prior studies. Etiology for  this adenopathy uncertain. Neoplastic etiology cannot be excluded.  4. Small pleural effusions bilaterally with bibasilar atelectasis. Underlying centrilobular emphysematous change. Areas of reticulonodular interstitial prominence and patchy peribronchovascular ground-glass type appearance. Scattered subcentimeter pulmonary nodules remain overall stable. No frank consolidation.  5. Hiatal hernia present. Esophagus appears distended containing fluid and air. Suspect esophageal motility disorder.  6. Reflux of contrast into the inferior vena cava and hepatic veins likely indicates increased right heart pressure.   I have personally reviewed and interpreted all radiology studies and my findings are as above.  VENTILATOR SETTINGS:    Cultures 9/17 MRSA by PCR negative  Antimicrobials: Anti-infectives (From admission, onward)   None       Devices    LINES / TUBES:      Continuous Infusions: . sodium chloride       Objective: Vitals:   07/24/19 1922 07/24/19 2312 07/25/19 0420 07/25/19 0744  BP: 108/64 123/67 125/78 (!) 133/93  Pulse: 70 71 79 94  Resp: 17 13 16  (!) 21  Temp: 97.6 F (36.4 C) 98.1 F (36.7 C) 97.8 F (36.6 C) 98.1 F (36.7 C)  TempSrc: Oral Oral Oral Oral  SpO2: 93% 96% 92% 92%  Weight:   63.1 kg   Height:        Intake/Output Summary (Last 24 hours) at 07/25/2019 0947 Last data filed at 07/25/2019 0800 Gross per 24 hour  Intake 960 ml  Output 2650 ml  Net -1690 ml   Filed Weights   07/23/19 0349 07/24/19 0500 07/25/19 0420  Weight: 63.6 kg 62.8 kg 63.1 kg    Examination:  General: A/O x4, no acute respiratory distress, cachectic Eyes: negative scleral hemorrhage, negative anisocoria, negative icterus ENT: Negative Runny nose, negative gingival bleeding, Neck:  Negative scars, masses, torticollis, lymphadenopathy, JVD Lungs: Clear to auscultation bilaterally without wheezes or crackles Cardiovascular: Regular rate and  rhythm without murmur gallop or rub normal S1 and S2 Abdomen: negative abdominal pain, nondistended, positive soft, bowel sounds, no rebound, no ascites, no appreciable mass Extremities: No significant cyanosis, clubbing, or edema bilateral lower extremities Skin: Negative rashes, lesions, ulcers Psychiatric:  Negative depression, negative anxiety, negative fatigue, negative mania  Central nervous system:  Cranial nerves II through XII intact, tongue/uvula midline, all extremities muscle strength 5/5, sensation intact throughout, negative dysarthria, negative expressive aphasia, negative receptive aphasia.  .     Data Reviewed: Care during the described time interval was provided by me .  I have reviewed this patient's available data, including medical history, events of note, physical examination, and all test results as part of my evaluation.   CBC: Recent Labs  Lab 07/22/19 1020 07/22/19 1034 07/23/19 0125 07/24/19 0215 07/25/19 0208  WBC 7.1  --  9.3 7.3 9.9  NEUTROABS 5.6  --  6.4 4.4 7.1  HGB 9.4* 10.2* 9.2* 9.2* 10.3*  HCT 29.4* 30.0* 30.2* 29.6* 32.1*  MCV 89.1  --  88.0 88.6 87.2  PLT 294  --  309 265 272   Basic Metabolic Panel: Recent Labs  Lab 07/22/19 1020 07/22/19 1034 07/23/19 0125 07/24/19 0215 07/25/19 0208  NA 140 141 139 138 138  K 3.4* 3.3* 3.5 2.9* 3.4*  CL 103  --  100  98 98  CO2 26  --  27 29 28   GLUCOSE 108*  --  109* 101* 99  BUN 11  --  16 14 16   CREATININE 0.82  --  0.87 0.97 0.97  CALCIUM 8.8*  --  8.7* 8.4* 8.7*   GFR: Estimated Creatinine Clearance: 40.7 mL/min (by C-G formula based on SCr of 0.97 mg/dL). Liver Function Tests: Recent Labs  Lab 07/22/19 1020  AST 18  ALT 13  ALKPHOS 66  BILITOT 0.7  PROT 6.2*  ALBUMIN 3.4*   No results for input(s): LIPASE, AMYLASE in the last 168 hours. No results for input(s): AMMONIA in the last 168 hours. Coagulation Profile: No results for input(s): INR, PROTIME in the last 168 hours.  Cardiac Enzymes: No results for input(s): CKTOTAL, CKMB, CKMBINDEX, TROPONINI in the last 168 hours. BNP (last 3 results) Recent Labs    01/12/19 1427  PROBNP 196.0*   HbA1C: No results for input(s): HGBA1C in the last 72 hours. CBG: No results for input(s): GLUCAP in the last 168 hours. Lipid Profile: No results for input(s): CHOL, HDL, LDLCALC, TRIG, CHOLHDL, LDLDIRECT in the last 72 hours. Thyroid Function Tests: No results for input(s): TSH, T4TOTAL, FREET4, T3FREE, THYROIDAB in the last 72 hours. Anemia Panel: Recent Labs    07/24/19 0215  VITAMINB12 413  FOLATE 24.4  FERRITIN 24  TIBC 361  IRON 17*   Urine analysis:    Component Value Date/Time   COLORURINE STRAW (A) 02/01/2018 1643   APPEARANCEUR CLEAR 02/01/2018 1643   LABSPEC 1.008 02/01/2018 1643   PHURINE 7.0 02/01/2018 1643   GLUCOSEU NEGATIVE 02/01/2018 1643   HGBUR NEGATIVE 02/01/2018 1643   HGBUR negative 07/19/2009 1509   BILIRUBINUR Negative 07/31/2018 1055   KETONESUR NEGATIVE 02/01/2018 1643   PROTEINUR Positive (A) 07/31/2018 1055   PROTEINUR NEGATIVE 02/01/2018 1643   UROBILINOGEN 0.2 07/31/2018 1055   UROBILINOGEN 0.2 02/05/2012 0058   NITRITE Positive 07/31/2018 1055   NITRITE NEGATIVE 02/01/2018 1643   LEUKOCYTESUR Large (3+) (A) 07/31/2018 1055   Sepsis Labs: @LABRCNTIP (procalcitonin:4,lacticidven:4)  ) Recent Results (from the past 240 hour(s))  SARS Coronavirus 2 [facility](Hospital order, Performed in [facility]) Nasopharyngeal Nasopharyngeal Swab     Status: None   Collection Time: 07/22/19 12:13 PM   Specimen: Nasopharyngeal Swab  Result Value Ref Range Status   SARS Coronavirus 2 NEGATIVE NEGATIVE Final    Comment: (NOTE) If result is NEGATIVE SARS-CoV-2 target nucleic acids are NOT DETECTED. The SARS-CoV-2 RNA is generally detectable in upper and lower  respiratory specimens during the acute phase of infection. The lowest  concentration of SARS-CoV-2 viral copies this  assay can detect is 250  copies / mL. A negative result does not preclude SARS-CoV-2 infection  and should not be used as the sole basis for treatment or other  patient management decisions.  A negative result may occur with  improper specimen collection / handling, submission of specimen other  than nasopharyngeal swab, presence of viral mutation(s) within the  areas targeted by this assay, and inadequate number of viral copies  (<250 copies / mL). A negative result must be combined with clinical  observations, patient history, and epidemiological information. If result is POSITIVE SARS-CoV-2 target nucleic acids are DETECTED. The SARS-CoV-2 RNA is generally detectable in upper and lower  respiratory specimens dur ing the acute phase of infection.  Positive  results are indicative of active infection with SARS-CoV-2.  Clinical  correlation with patient history and other diagnostic  information is  necessary to determine patient infection status.  Positive results do  not rule out bacterial infection or co-infection with other viruses. If result is PRESUMPTIVE POSTIVE SARS-CoV-2 nucleic acids MAY BE PRESENT.   A presumptive positive result was obtained on the submitted specimen  and confirmed on repeat testing.  While 2019 novel coronavirus  (SARS-CoV-2) nucleic acids may be present in the submitted sample  additional confirmatory testing may be necessary for epidemiological  and / or clinical management purposes  to differentiate between  SARS-CoV-2 and other Sarbecovirus currently known to infect humans.  If clinically indicated additional testing with an alternate test  methodology [phone]) is advised. The SARS-CoV-2 RNA is generally  detectable in upper and lower respiratory sp ecimens during the acute  phase of infection. The expected result is Negative. Fact Sheet for Patients:  BoilerBrush.com.cy Fact Sheet for Healthcare Providers:  https://pope.com/ This test is not yet approved or cleared by the [place] FDA and has been authorized for detection and/or diagnosis of SARS-CoV-2 by FDA under an Emergency Use Authorization (EUA).  This EUA will remain in effect (meaning this test can be used) for the duration of the COVID-19 declaration under Section 564(b)(1) of the Act, 21 U.S.C. section 360bbb-3(b)(1), unless the authorization is terminated or revoked sooner. Performed at [facility], [address] [place], [place] [address]   MRSA PCR Screening     Status: None   Collection Time: 07/22/19  7:59 PM   Specimen: Nasal Mucosa; Nasopharyngeal  Result Value Ref Range Status   MRSA by PCR NEGATIVE NEGATIVE Final    Comment:        The GeneXpert MRSA Assay (FDA approved for NASAL specimens only), is one component of a comprehensive MRSA colonization surveillance program. It is not intended to diagnose MRSA infection nor to guide or monitor treatment for MRSA infections. Performed at [facility], [address], [place], [place] [address]          Radiology Studies: Vas [place] Lower Extremity Venous (dvt)  Result Date: 07/24/2019  Lower Venous Study Indications: Swelling.  Comparison Study: No prior study on file Performing Technologist: Sherren Kerns RVS  Examination Guidelines: A complete evaluation includes B-mode imaging, spectral Doppler, color Doppler, and power Doppler as needed of all accessible portions of each vessel. Bilateral testing is considered an integral part of a complete examination. Limited examinations for reoccurring indications may be performed as noted.  +---------+---------------+---------+-----------+----------+--------------+ RIGHT    CompressibilityPhasicitySpontaneityPropertiesThrombus Aging +---------+---------------+---------+-----------+----------+--------------+ CFV      Full           Yes      Yes                                  +---------+---------------+---------+-----------+----------+--------------+ SFJ      Full                                                        +---------+---------------+---------+-----------+----------+--------------+ FV Prox  Full                                                        +---------+---------------+---------+-----------+----------+--------------+  FV Mid   Full                                                        +---------+---------------+---------+-----------+----------+--------------+ FV DistalFull                                                        +---------+---------------+---------+-----------+----------+--------------+ PFV      Full                                                        +---------+---------------+---------+-----------+----------+--------------+ POP      Full           Yes      Yes                                 +---------+---------------+---------+-----------+----------+--------------+ PTV      Full                                                        +---------+---------------+---------+-----------+----------+--------------+ PERO     Full                                                        +---------+---------------+---------+-----------+----------+--------------+   +---------+---------------+---------+-----------+----------+--------------+ LEFT     CompressibilityPhasicitySpontaneityPropertiesThrombus Aging +---------+---------------+---------+-----------+----------+--------------+ CFV      Full           Yes      Yes                                 +---------+---------------+---------+-----------+----------+--------------+ SFJ      Full                                                        +---------+---------------+---------+-----------+----------+--------------+ FV Prox  Full                                                         +---------+---------------+---------+-----------+----------+--------------+ FV Mid   Full                                                        +---------+---------------+---------+-----------+----------+--------------+  FV DistalFull                                                        +---------+---------------+---------+-----------+----------+--------------+ PFV      Full                                                        +---------+---------------+---------+-----------+----------+--------------+ POP      Full           Yes      Yes                                 +---------+---------------+---------+-----------+----------+--------------+ PTV      Full                                                        +---------+---------------+---------+-----------+----------+--------------+ PERO     Full                                                        +---------+---------------+---------+-----------+----------+--------------+     Summary: Right: There is no evidence of deep vein thrombosis in the lower extremity. Left: There is no evidence of deep vein thrombosis in the lower extremity.  *See table(s) above for measurements and observations.    Preliminary         Scheduled Meds: . aspirin EC  81 mg Oral Daily  . carvedilol  3.125 mg Oral BID WC  . enoxaparin (LOVENOX) injection  40 mg Subcutaneous Q24H  . ferrous sulfate  325 mg Oral Q breakfast  . furosemide  40 mg Intravenous BID  . gabapentin  100 mg Oral TID  . pantoprazole (PROTONIX) IV  40 mg Intravenous Q24H  . sacubitril-valsartan  1 tablet Oral BID  . sertraline  100 mg Oral QHS  . spironolactone  12.5 mg Oral Daily   Continuous Infusions: . sodium chloride       LOS: 1 day   The patient is critically ill with multiple organ systems failure and requires high complexity decision making for assessment and support, frequent evaluation and titration of therapies, application of advanced  monitoring technologies and extensive interpretation of multiple databases. Critical Care Time devoted to patient care services described in this note  Time spent: 40 minutes     , Geraldo Docker, MD [facility] Pager [phone]  If 7PM-7AM, please contact night-coverage [email or web address] Password [facility] 07/25/2019, 9:47 AM

## 2019-07-26 ENCOUNTER — Ambulatory Visit (HOSPITAL_COMMUNITY)
Admission: RE | Admit: 2019-07-26 | Payer: Medicare Other | Source: Home / Self Care | Admitting: Interventional Cardiology

## 2019-07-26 ENCOUNTER — Encounter (HOSPITAL_COMMUNITY)
Admission: EM | Disposition: A | Discharge status: Home Health | Payer: Self-pay | Source: Home / Self Care | Attending: Internal Medicine

## 2019-07-26 DIAGNOSIS — I5043 Acute on chronic combined systolic (congestive) and diastolic (congestive) heart failure: Secondary | ICD-10-CM

## 2019-07-26 DIAGNOSIS — R0609 Other forms of dyspnea: Secondary | ICD-10-CM

## 2019-07-26 DIAGNOSIS — R0600 Dyspnea, unspecified: Secondary | ICD-10-CM

## 2019-07-26 DIAGNOSIS — I471 Supraventricular tachycardia: Secondary | ICD-10-CM

## 2019-07-26 DIAGNOSIS — I5022 Chronic systolic (congestive) heart failure: Secondary | ICD-10-CM

## 2019-07-26 DIAGNOSIS — F310 Bipolar disorder, current episode hypomanic: Secondary | ICD-10-CM

## 2019-07-26 HISTORY — PX: RIGHT/LEFT HEART CATH AND CORONARY ANGIOGRAPHY: CATH118266

## 2019-07-26 LAB — POCT I-STAT EG7
Acid-Base Excess: 3 mmol/L — ABNORMAL HIGH (ref 0.0–2.0)
Acid-Base Excess: 3 mmol/L — ABNORMAL HIGH (ref 0.0–2.0)
Bicarbonate: 29.6 mmol/L — ABNORMAL HIGH (ref 20.0–28.0)
Bicarbonate: 29.9 mmol/L — ABNORMAL HIGH (ref 20.0–28.0)
Calcium, Ion: 1.22 mmol/L (ref 1.15–1.40)
Calcium, Ion: 1.23 mmol/L (ref 1.15–1.40)
HCT: 29 % — ABNORMAL LOW (ref 36.0–46.0)
HCT: 30 % — ABNORMAL LOW (ref 36.0–46.0)
Hemoglobin: 10.2 g/dL — ABNORMAL LOW (ref 12.0–15.0)
Hemoglobin: 9.9 g/dL — ABNORMAL LOW (ref 12.0–15.0)
O2 Saturation: 71 %
O2 Saturation: 76 %
Potassium: 3.9 mmol/L (ref 3.5–5.1)
Potassium: 3.9 mmol/L (ref 3.5–5.1)
Sodium: 141 mmol/L (ref 135–145)
Sodium: 142 mmol/L (ref 135–145)
TCO2: 31 mmol/L (ref 22–32)
TCO2: 32 mmol/L (ref 22–32)
pCO2, Ven: 53.3 mmHg (ref 44.0–60.0)
pCO2, Ven: 55 mmHg (ref 44.0–60.0)
pH, Ven: 7.343 (ref 7.250–7.430)
pH, Ven: 7.353 (ref 7.250–7.430)
pO2, Ven: 40 mmHg (ref 32.0–45.0)
pO2, Ven: 44 mmHg (ref 32.0–45.0)

## 2019-07-26 LAB — POCT I-STAT 7, (LYTES, BLD GAS, ICA,H+H)
Acid-Base Excess: 1 mmol/L (ref 0.0–2.0)
Bicarbonate: 27.6 mmol/L (ref 20.0–28.0)
Calcium, Ion: 1.23 mmol/L (ref 1.15–1.40)
HCT: 29 % — ABNORMAL LOW (ref 36.0–46.0)
Hemoglobin: 9.9 g/dL — ABNORMAL LOW (ref 12.0–15.0)
O2 Saturation: 99 %
Potassium: 3.7 mmol/L (ref 3.5–5.1)
Sodium: 136 mmol/L (ref 135–145)
TCO2: 29 mmol/L (ref 22–32)
pCO2 arterial: 52.7 mmHg — ABNORMAL HIGH (ref 32.0–48.0)
pH, Arterial: 7.328 — ABNORMAL LOW (ref 7.350–7.450)
pO2, Arterial: 152 mmHg — ABNORMAL HIGH (ref 83.0–108.0)

## 2019-07-26 LAB — CBC
HCT: 32.5 % — ABNORMAL LOW (ref 36.0–46.0)
HCT: 34.8 % — ABNORMAL LOW (ref 36.0–46.0)
Hemoglobin: 10.2 g/dL — ABNORMAL LOW (ref 12.0–15.0)
Hemoglobin: 10.1 g/dL — ABNORMAL LOW (ref 12.0–15.0)
MCH: 27.5 pg (ref 26.0–34.0)
MCH: 26.9 pg (ref 26.0–34.0)
MCHC: 31.4 g/dL (ref 30.0–36.0)
MCHC: 29 g/dL — ABNORMAL LOW (ref 30.0–36.0)
MCV: 87.6 fL (ref 80.0–100.0)
MCV: 92.8 fL (ref 80.0–100.0)
Platelets: 292 10*3/uL (ref 150–400)
Platelets: 256 10*3/uL (ref 150–400)
RBC: 3.71 MIL/uL — ABNORMAL LOW (ref 3.87–5.11)
RBC: 3.75 MIL/uL — ABNORMAL LOW (ref 3.87–5.11)
RDW: 14 % (ref 11.5–15.5)
RDW: 14.2 % (ref 11.5–15.5)
WBC: 9.4 10*3/uL (ref 4.0–10.5)
WBC: 8.2 10*3/uL (ref 4.0–10.5)
nRBC: 0 % (ref 0.0–0.2)
nRBC: 0 % (ref 0.0–0.2)

## 2019-07-26 LAB — BASIC METABOLIC PANEL
Anion gap: 11 (ref 5–15)
BUN: 20 mg/dL (ref 8–23)
CO2: 27 mmol/L (ref 22–32)
Calcium: 8.7 mg/dL — ABNORMAL LOW (ref 8.9–10.3)
Chloride: 101 mmol/L (ref 98–111)
Creatinine, Ser: 1.31 mg/dL — ABNORMAL HIGH (ref 0.44–1.00)
GFR calc Af Amer: 44 mL/min — ABNORMAL LOW (ref >60)
GFR calc non Af Amer: 38 mL/min — ABNORMAL LOW (ref >60)
Glucose, Bld: 107 mg/dL — ABNORMAL HIGH (ref 70–99)
Potassium: 4 mmol/L (ref 3.5–5.1)
Sodium: 139 mmol/L (ref 135–145)

## 2019-07-26 LAB — CREATININE, SERUM
Creatinine, Ser: 0.78 mg/dL (ref 0.44–1.00)
GFR calc Af Amer: >60 mL/min (ref >60)
GFR calc non Af Amer: >60 mL/min (ref >60)

## 2019-07-26 LAB — TSH: TSH: 1.828 u[IU]/mL (ref 0.350–4.500)

## 2019-07-26 LAB — MAGNESIUM: Magnesium: 1.9 mg/dL (ref 1.7–2.4)

## 2019-07-26 SURGERY — RIGHT/LEFT HEART CATH AND CORONARY ANGIOGRAPHY
Anesthesia: LOCAL

## 2019-07-26 MED ORDER — LIDOCAINE HCL (PF) 1 % IJ SOLN
INTRAMUSCULAR | Status: DC | PRN
  Administered 2019-07-26 (×2): 2 mL

## 2019-07-26 MED ORDER — VERAPAMIL HCL 2.5 MG/ML IV SOLN
INTRAVENOUS | Status: AC
  Filled 2019-07-26: qty 2

## 2019-07-26 MED ORDER — VERAPAMIL HCL 2.5 MG/ML IV SOLN
INTRAVENOUS | Status: DC | PRN
  Administered 2019-07-26: 10 mL via INTRA_ARTERIAL

## 2019-07-26 MED ORDER — ACETAMINOPHEN 325 MG PO TABS
650.0000 mg | ORAL_TABLET | ORAL | Status: DC | PRN
  Administered 2019-07-27: 04:00:00 650 mg via ORAL

## 2019-07-26 MED ORDER — MIDAZOLAM HCL 2 MG/2ML IJ SOLN
INTRAMUSCULAR | Status: DC | PRN
  Administered 2019-07-26 (×2): 1 mg via INTRAVENOUS

## 2019-07-26 MED ORDER — HEPARIN (PORCINE) IN NACL 1000-0.9 UT/500ML-% IV SOLN
INTRAVENOUS | Status: DC | PRN
  Administered 2019-07-26 (×2): 500 mL

## 2019-07-26 MED ORDER — SODIUM CHLORIDE 0.9 % IV SOLN
INTRAVENOUS | Status: AC
  Administered 2019-07-26: 16:00:00 via INTRAVENOUS

## 2019-07-26 MED ORDER — SODIUM CHLORIDE 0.9% FLUSH
3.0000 mL | Freq: Two times a day (BID) | INTRAVENOUS | Status: DC
  Administered 2019-07-26 – 2019-07-27 (×3): 3 mL via INTRAVENOUS

## 2019-07-26 MED ORDER — SODIUM CHLORIDE 0.9% FLUSH: 3.0000 mL | INTRAVENOUS | Status: DC | PRN

## 2019-07-26 MED ORDER — FENTANYL CITRATE (PF) 100 MCG/2ML IJ SOLN
INTRAMUSCULAR | Status: AC
  Filled 2019-07-26: qty 2

## 2019-07-26 MED ORDER — IOHEXOL 350 MG/ML SOLN
INTRAVENOUS | Status: DC | PRN
  Administered 2019-07-26: 70 mL

## 2019-07-26 MED ORDER — SODIUM CHLORIDE 0.9 % IV SOLN: 250.0000 mL | INTRAVENOUS | Status: DC | PRN

## 2019-07-26 MED ORDER — HEPARIN SODIUM (PORCINE) 1000 UNIT/ML IJ SOLN
INTRAMUSCULAR | Status: AC
  Filled 2019-07-26: qty 1

## 2019-07-26 MED ORDER — LIDOCAINE HCL (PF) 1 % IJ SOLN
INTRAMUSCULAR | Status: AC
  Filled 2019-07-26: qty 30

## 2019-07-26 MED ORDER — HEPARIN (PORCINE) IN NACL 1000-0.9 UT/500ML-% IV SOLN
INTRAVENOUS | Status: AC
  Filled 2019-07-26: qty 1000

## 2019-07-26 MED ORDER — HEPARIN SODIUM (PORCINE) 1000 UNIT/ML IJ SOLN
INTRAMUSCULAR | Status: DC | PRN
  Administered 2019-07-26: 3000 [IU] via INTRAVENOUS

## 2019-07-26 MED ORDER — ENOXAPARIN SODIUM 40 MG/0.4ML ~~LOC~~ SOLN
40.0000 mg | SUBCUTANEOUS | Status: DC
  Administered 2019-07-27: 40 mg via SUBCUTANEOUS
  Filled 2019-07-26 (×2): qty 0.4

## 2019-07-26 MED ORDER — MIDAZOLAM HCL 2 MG/2ML IJ SOLN
INTRAMUSCULAR | Status: AC
  Filled 2019-07-26: qty 2

## 2019-07-26 MED ORDER — HYDRALAZINE HCL 20 MG/ML IJ SOLN: 10.0000 mg | INTRAMUSCULAR | Status: AC | PRN

## 2019-07-26 MED ORDER — OXYCODONE HCL 5 MG PO TABS: 5.0000 mg | ORAL_TABLET | ORAL | Status: DC | PRN

## 2019-07-26 MED ORDER — ONDANSETRON HCL 4 MG/2ML IJ SOLN
4.0000 mg | Freq: Four times a day (QID) | INTRAMUSCULAR | Status: DC | PRN
  Administered 2019-07-27: 4 mg via INTRAVENOUS

## 2019-07-26 MED ORDER — LABETALOL HCL 5 MG/ML IV SOLN: 10.0000 mg | INTRAVENOUS | Status: AC | PRN

## 2019-07-26 MED ORDER — FENTANYL CITRATE (PF) 100 MCG/2ML IJ SOLN
INTRAMUSCULAR | Status: DC | PRN
  Administered 2019-07-26 (×3): 25 ug via INTRAVENOUS

## 2019-07-26 MED FILL — Medication: Qty: 1 | Status: AC

## 2019-07-26 SURGICAL SUPPLY — 11 items
CATH 5FR JL3.5 JR4 ANG PIG MP (CATHETERS) ×1 IMPLANT
CATH BALLN WEDGE 5F 110CM (CATHETERS) ×1 IMPLANT
DEVICE RAD COMP TR BAND LRG (VASCULAR PRODUCTS) ×1 IMPLANT
GLIDESHEATH SLEND A-KIT 6F 22G (SHEATH) ×1 IMPLANT
GUIDEWIRE .025 260CM (WIRE) ×1 IMPLANT
KIT HEART LEFT (KITS) ×2 IMPLANT
PACK CARDIAC CATHETERIZATION (CUSTOM PROCEDURE TRAY) ×2 IMPLANT
SHEATH GLIDE SLENDER 4/5FR (SHEATH) ×1 IMPLANT
SHEATH PROBE COVER 6X72 (BAG) ×1 IMPLANT
TRANSDUCER W/STOPCOCK (MISCELLANEOUS) ×2 IMPLANT
TUBING CIL FLEX 10 FLL-RA (TUBING) ×2 IMPLANT

## 2019-07-26 NOTE — TOC Initial Note (Signed)
Transition of Care [facility]) - Initial/Assessment Note    Patient Details  Name: Julie Rice MRN: [id number] Date of Birth: 1936-01-18  Transition of [facility]) CM/SW Contact:    Alberteen Sam, [place] Phone Number: [phone] 07/26/2019, 12:48 PM  Clinical Narrative:                  CSW met with patient at bedside to offer home health services as recommended by PT. Patient agreeable and reports using [facility] in the past which is her preference. Patient has support from her sister and law and brother who live in [place], sister in law at bedside reports she can provide support for patient upon discharge from the hospital in addition to the home health services that will be set up.   Patient identifies no DME needs as she states she has a walker and tub bench at home, sister in law has additional equipment if needed at time of discharge.   Patient is set up with [facility] for [place] PT. No further needs identified at this time.   Expected Discharge Plan: [place] Barriers to Discharge: Continued Medical Work up   Patient Goals and CMS Choice Patient states their goals for this hospitalization and ongoing recovery are:: to go home CMS Medicare.gov Compare Post Acute Care list provided to:: Patient Choice offered to / list presented to : Patient  Expected Discharge Plan and Services Expected Discharge Plan: [place] Choice: [place] arrangements for the past 2 months: Single Family Home Expected Discharge Date: 07/25/19                         HH Arranged: PT HH Agency: [facility] Date [place]: 07/26/19 Time [place]: 23 Representative spoke with at [place]: Dorian Pod  Prior Living Arrangements/Services Living arrangements for the past 2 months: [place] with:: Self Patient language and need for interpreter reviewed:: Yes Do you feel safe going back  to the place where you live?: Yes      Need for Family Participation in Patient Care: Yes (Comment) Care giver support system in place?: Yes (comment)   Criminal Activity/Legal Involvement Pertinent to Current Situation/Hospitalization: No - Comment as needed  Activities of Daily Living Home Assistive Devices/Equipment: None ADL Screening (condition at time of admission) Patient's cognitive ability adequate to safely complete daily activities?: Yes Is the patient deaf or have difficulty hearing?: No Does the patient have difficulty seeing, even when wearing glasses/contacts?: No Does the patient have difficulty concentrating, remembering, or making decisions?: No Patient able to express need for assistance with ADLs?: Yes Does the patient have difficulty dressing or bathing?: No Independently performs ADLs?: Yes (appropriate for developmental age) Does the patient have difficulty walking or climbing stairs?: No Weakness of Legs: None Weakness of Arms/Hands: None  Permission Sought/Granted Permission sought to share information with : Case Manager, Customer service manager, Family Supports Permission granted to share information with : Yes, Verbal Permission Granted     Permission granted to share info w AGENCY: [place]        Emotional Assessment Appearance:: Appears stated age Attitude/Demeanor/Rapport: Gracious Affect (typically observed): Calm Orientation: : Oriented to Self, Oriented to Place, Oriented to  Time, Oriented to Situation Alcohol / Substance Use: Not Applicable Psych Involvement: No (comment)  Admission diagnosis:  Shortness of breath [R06.02] Pleural  effusion [J90] Patient Active Problem List   Diagnosis Date Noted  . CHF (congestive heart failure) ([place]) 07/24/2019  . Chest pain of uncertain etiology   . SOB (shortness of breath)   . Asthma 04/22/2019  . Abnormal CT of the chest 02/23/2019  . Esophageal motility disorder   .  Esophageal dysmotility   . Loss of weight   . Moderate protein-calorie malnutrition ([place])   . Hematemesis 04/06/2018  . Periesophageal hiatal hernia 08/02/2016  . Dysphagia 05/10/2016  . Esophageal stricture 05/10/2016  . Epigastric pain 05/10/2016  . Generalized anxiety disorder 02/06/2012  . Major depressive disorder, recurrent ([place]) 02/05/2012  . Hypokalemia 11/09/2011  . Nausea and vomiting in adult 11/08/2011  . Dehydration 11/08/2011  . Tremors of nervous system 11/08/2011  . Hypothyroidism 05/10/2008  . Dyslipidemia 05/10/2008  . UNSPECIFIED ANEMIA 05/10/2008  . CAROTID ARTERY DISEASE 05/10/2008  . Transient cerebral ischemia 05/10/2008  . Chronic back pain 05/10/2008  . OSTEOPENIA 05/10/2008  . Dyspnea 05/10/2008  . ESOPHAGEAL STRICTURE 01/19/2008  . Essential hypertension 04/29/2007  . GERD 04/29/2007  . Osteoarthritis 04/29/2007  . SPONDYLOSIS 04/29/2007   PCP:  [name], NP Pharmacy:   [id number] [id number]: [phone]: [phone]    Social Determinants of Health (SDOH) Interventions    Readmission Risk Interventions No flowsheet data found.

## 2019-07-26 NOTE — Progress Notes (Signed)
PROGRESS NOTE    Julie Rice  DTO:[id number] DOB: Jan 10, 1936 DOA: 07/22/2019 PCP: Shirline Frees, NP   Brief Narrative:  83 y.o. WF PMHx  Bipolar, TIA; TA; HTN; HLD; fibromyalgia with chronic pain; COPD; CAD;   Presenting with SOB.  She reports significant SOB.  She has been weak and SOB.  She had an appointment with Dr. Eden Emms and they said she should come to the ER.  She has left flank pain.  SOB is with ambulation, not bad on short distances.  She has nighttime cough, waking her up about 3AM.  She feels a rattling in her throat without wheezing.  Cough is nonproductive.  No fevers.  No weight change.  Mild LE edema in the late evening or night.  2 pillow orthopnea.  No PND but cough wakes her up at night.  She uses her inhaler in the AM with some relief.   Subjective: 9/21 A/O x4, negative S OB, negative CP, negative abdominal pain, negative N/V.  States has a GI appointment scheduled in October.   Assessment & Plan:   Principal Problem:   Dyspnea Active Problems:   Hypothyroidism   Dyslipidemia   Essential hypertension   Generalized anxiety disorder   Esophageal dysmotility   Asthma   Chest pain of uncertain etiology   SOB (shortness of breath)   CHF (congestive heart failure) (HCC)   New onset systolic CHF/cardiomyopathy -Strict in and out -4.2 L -Daily weight Filed Weights   07/24/19 0500 07/25/19 0420 07/26/19 0343  Weight: 62.8 kg 63.1 kg 64 kg  -Troponin high-sensitivity x2- -EKG LBBB (old) -PCXR; pulmonary edema -CTA negative for PE -Echo; EF 35 to 40% which is new per cardiology see results below -9/21 s/p RIGHT/LEFT heart catheterization  -ASA 81 mg daily - Coreg 3.125 mg BID - Entresto 24-26 mg/tab. BID -Spironolactone 12.5 mg daily  Essential HTN -See CHF  Hypokalemia - Potassium goal>4 -K-Dur 50 mEq  Hypothyroidism -TSH WNL - Not on Synthroid per home medication list  Asthma?/COPD  -Titrate O2 to maintain SPO2 89 to 93% -Albuterol PRN  -Emphysematous change on CTA see results below -Upon discharge schedule appointment with pulmonology for spirometry, pre-/post bronchodilator.   Elevated d-dimer -CTA negative for PE -Lower extremity Doppler negative   Chronic normocytic anemia/iron deficiency anemia Hemoglobin at baseline Anemia panel showed iron 17, sats 5, TIBC 361, ferritin 24, folate 24, vitamin B12 413 Gave 1 dose of Feraheme, continue supplemental oral iron Daily CBC  Esophageal dysmotility -Status post fundoplication with post fundoplication dysphasia secondary to dysmotility -Has chronic cough -Continue Protonix, Carafate -Follow-up with GI plan for repeat EGD with Botox and balloon dilation next month or sooner if needed.   -Per patient has GI appointment already scheduled.  Generalized anxiety disorder/bipolar disorder - Clonazepam 0.5 mg BID PRN -Zoloft 100 mg qhs    DVT prophylaxis: Lovenox Code Status: Full Family Communication: None Disposition Plan: Per cardiology   Consultants:  Cardiology     Procedures/Significant Events:  9/17 CTA PE protocol:-Negative PE  -Prominence of the main pulmonary outflow tract, a finding felt to indicate PAH  -Areas of adenopathy noted with increased lymph node prominence compared to prior studies. Etiology for this adenopathy uncertain. Neoplastic etiology cannot be excluded. -Small pleural effusions bilaterally with bibasilar atelectasis. Underlying centrilobular emphysematous change.-Scattered subcentimeter pulmonary nodules remain overall stable. -Hiatal hernia present. Esophagus appears distended containing fluid and air. Suspect esophageal motility disorder. -Reflux of contrast into the inferior vena cava and hepatic veins likely indicates  increased right heart pressure. 9/21 RIGHT/LEFT heart catheterization;-right dominant coronary anatomy -Widely patent left main 30 to 40% mid LAD -Widely patent circumflex -Luminal irregularities in a dominant  right coronary. -Right heart pressures are relatively low.  Pulmonary wedge pressure mean is 2 mmHg. LVEDP 4 mmHg.  Left ventriculography by hand-injection was not helpful.  From images obtained, LV function appears relatively normal.     I have personally reviewed and interpreted all radiology studies and my findings are as above.  VENTILATOR SETTINGS:    Cultures 9/17 MRSA by PCR negative    Antimicrobials: Anti-infectives (From admission, onward)   None       Devices    LINES / TUBES:      Continuous Infusions: . sodium chloride    . sodium chloride    . sodium chloride 1,000 mL (07/26/19 0539)     Objective: Vitals:   07/25/19 1941 07/25/19 2354 07/26/19 0343 07/26/19 0715  BP: (!) 111/56 (!) 133/51 (!) 141/70 128/68  Pulse: 94 76 77 73  Resp: 17 14 17 16   Temp: (!) 97.5 F (36.4 C) 98.6 F (37 C) 97.7 F (36.5 C) 97.7 F (36.5 C)  TempSrc: Oral Oral Oral Oral  SpO2: 98% 100% 100% 100%  Weight:   64 kg   Height:        Intake/Output Summary (Last 24 hours) at 07/26/2019 0736 Last data filed at 07/26/2019 0539 Gross per 24 hour  Intake 480 ml  Output 1500 ml  Net -1020 ml   Filed Weights   07/24/19 0500 07/25/19 0420 07/26/19 0343  Weight: 62.8 kg 63.1 kg 64 kg   Physical Exam:  General: A/O x4, no acute respiratory distress Eyes: negative scleral hemorrhage, negative anisocoria, negative icterus ENT: Negative Runny nose, negative gingival bleeding, Neck:  Negative scars, masses, torticollis, lymphadenopathy, JVD Lungs: Clear to auscultation bilaterally without wheezes or crackles Cardiovascular: Regular rate and rhythm without murmur gallop or rub normal S1 and S2 Abdomen: negative abdominal pain, nondistended, positive soft, bowel sounds, no rebound, no ascites, no appreciable mass Extremities: No significant cyanosis, clubbing, or edema bilateral lower extremities Skin: Negative rashes, lesions, ulcers Psychiatric:  Negative  depression, negative anxiety, negative fatigue, negative mania  Central nervous system:  Cranial nerves II through XII intact, tongue/uvula midline, all extremities muscle strength 5/5, sensation intact throughout, negative dysarthria, negative expressive aphasia, negative receptive aphasia.       Data Reviewed: Care during the described time interval was provided by me .  I have reviewed this patient's available data, including medical history, events of note, physical examination, and all test results as part of my evaluation.   CBC: Recent Labs  Lab 07/22/19 1020 07/22/19 1034 07/23/19 0125 07/24/19 0215 07/25/19 0208 07/26/19 0212  WBC 7.1  --  9.3 7.3 9.9 9.4  NEUTROABS 5.6  --  6.4 4.4 7.1  --   HGB 9.4* 10.2* 9.2* 9.2* 10.3* 10.2*  HCT 29.4* 30.0* 30.2* 29.6* 32.1* 32.5*  MCV 89.1  --  88.0 88.6 87.2 87.6  PLT 294  --  309 265 272 292   Basic Metabolic Panel: Recent Labs  Lab 07/23/19 0125 07/24/19 0215 07/25/19 0208 07/25/19 1021 07/25/19 1941 07/26/19 0212  NA 139 138 138  --  136 139  K 3.5 2.9* 3.4*  --  4.2 4.0  CL 100 98 98  --  100 101  CO2 27 29 28   --  25 27  GLUCOSE 109* 101* 99  --  118*  107*  BUN 16 14 16   --  22 20  CREATININE 0.87 0.97 0.97  --  1.44* 1.31*  CALCIUM 8.7* 8.4* 8.7*  --  8.3* 8.7*  MG  --   --   --  1.9 1.8 1.9  PHOS  --   --   --   --  2.9  --    GFR: Estimated Creatinine Clearance: 30.3 mL/min (A) (by C-G formula based on SCr of 1.31 mg/dL (H)). Liver Function Tests: Recent Labs  Lab 07/22/19 1020  AST 18  ALT 13  ALKPHOS 66  BILITOT 0.7  PROT 6.2*  ALBUMIN 3.4*   No results for input(s): LIPASE, AMYLASE in the last 168 hours. No results for input(s): AMMONIA in the last 168 hours. Coagulation Profile: No results for input(s): INR, PROTIME in the last 168 hours. Cardiac Enzymes: No results for input(s): CKTOTAL, CKMB, CKMBINDEX, TROPONINI in the last 168 hours. BNP (last 3 results) Recent Labs    01/12/19 1427   PROBNP 196.0*   HbA1C: No results for input(s): HGBA1C in the last 72 hours. CBG: No results for input(s): GLUCAP in the last 168 hours. Lipid Profile: No results for input(s): CHOL, HDL, LDLCALC, TRIG, CHOLHDL, LDLDIRECT in the last 72 hours. Thyroid Function Tests: Recent Labs    07/26/19 0212  TSH 1.828   Anemia Panel: Recent Labs    07/24/19 0215  VITAMINB12 413  FOLATE 24.4  FERRITIN 24  TIBC 361  IRON 17*   Urine analysis:    Component Value Date/Time   COLORURINE STRAW (A) 02/01/2018 1643   APPEARANCEUR CLEAR 02/01/2018 1643   LABSPEC 1.008 02/01/2018 1643   PHURINE 7.0 02/01/2018 1643   GLUCOSEU NEGATIVE 02/01/2018 1643   HGBUR NEGATIVE 02/01/2018 1643   HGBUR negative 07/19/2009 1509   BILIRUBINUR Negative 07/31/2018 1055   KETONESUR NEGATIVE 02/01/2018 1643   PROTEINUR Positive (A) 07/31/2018 1055   PROTEINUR NEGATIVE 02/01/2018 1643   UROBILINOGEN 0.2 07/31/2018 1055   UROBILINOGEN 0.2 02/05/2012 0058   NITRITE Positive 07/31/2018 1055   NITRITE NEGATIVE 02/01/2018 1643   LEUKOCYTESUR Large (3+) (A) 07/31/2018 1055   Sepsis Labs: @LABRCNTIP (procalcitonin:4,lacticidven:4)  ) Recent Results (from the past 240 hour(s))  SARS Coronavirus 2 [facility] order, Performed in [facility] hospital lab) Nasopharyngeal Nasopharyngeal Swab     Status: None   Collection Time: 07/22/19 12:13 PM   Specimen: Nasopharyngeal Swab  Result Value Ref Range Status   SARS Coronavirus 2 NEGATIVE NEGATIVE Final    Comment: (NOTE) If result is NEGATIVE SARS-CoV-2 target nucleic acids are NOT DETECTED. The SARS-CoV-2 RNA is generally detectable in upper and lower  respiratory specimens during the acute phase of infection. The lowest  concentration of SARS-CoV-2 viral copies this assay can detect is 250  copies / mL. A negative result does not preclude SARS-CoV-2 infection  and should not be used as the sole basis for treatment or other  patient management decisions.  A  negative result may occur with  improper specimen collection / handling, submission of specimen other  than nasopharyngeal swab, presence of viral mutation(s) within the  areas targeted by this assay, and inadequate number of viral copies  (<250 copies / mL). A negative result must be combined with clinical  observations, patient history, and epidemiological information. If result is POSITIVE SARS-CoV-2 target nucleic acids are DETECTED. The SARS-CoV-2 RNA is generally detectable in upper and lower  respiratory specimens dur ing the acute phase of infection.  Positive  results are  indicative of active infection with SARS-CoV-2.  Clinical  correlation with patient history and other diagnostic information is  necessary to determine patient infection status.  Positive results do  not rule out bacterial infection or co-infection with other viruses. If result is PRESUMPTIVE POSTIVE SARS-CoV-2 nucleic acids MAY BE PRESENT.   A presumptive positive result was obtained on the submitted specimen  and confirmed on repeat testing.  While 2019 novel coronavirus  (SARS-CoV-2) nucleic acids may be present in the submitted sample  additional confirmatory testing may be necessary for epidemiological  and / or clinical management purposes  to differentiate between  SARS-CoV-2 and other Sarbecovirus currently known to infect humans.  If clinically indicated additional testing with an alternate test  methodology [id number]) is advised. The SARS-CoV-2 RNA is generally  detectable in upper and lower respiratory sp ecimens during the acute  phase of infection. The expected result is Negative. Fact Sheet for Patients:  BoilerBrush.com.cy Fact Sheet for Healthcare Providers: https://pope.com/ This test is not yet approved or cleared by the [place] FDA and has been authorized for detection and/or diagnosis of SARS-CoV-2 by FDA under an Emergency Use  Authorization (EUA).  This EUA will remain in effect (meaning this test can be used) for the duration of the COVID-19 declaration under Section 564(b)(1) of the Act, 21 U.S.C. section 360bbb-3(b)(1), unless the authorization is terminated or revoked sooner. Performed at [facility], [address], [place], [place] [address]   MRSA PCR Screening     Status: None   Collection Time: 07/22/19  7:59 PM   Specimen: Nasal Mucosa; Nasopharyngeal  Result Value Ref Range Status   MRSA by PCR NEGATIVE NEGATIVE Final    Comment:        The GeneXpert MRSA Assay (FDA approved for NASAL specimens only), is one component of a comprehensive MRSA colonization surveillance program. It is not intended to diagnose MRSA infection nor to guide or monitor treatment for MRSA infections. Performed at [facility], [address], [place], [place] [address]          Radiology Studies: Vas [place] Lower Extremity Venous (dvt)  Result Date: 07/25/2019  Lower Venous Study Indications: Swelling.  Comparison Study: No prior study on file Performing Technologist: Sherren Kerns RVS  Examination Guidelines: A complete evaluation includes B-mode imaging, spectral Doppler, color Doppler, and power Doppler as needed of all accessible portions of each vessel. Bilateral testing is considered an integral part of a complete examination. Limited examinations for reoccurring indications may be performed as noted.  +---------+---------------+---------+-----------+----------+--------------+ RIGHT    CompressibilityPhasicitySpontaneityPropertiesThrombus Aging +---------+---------------+---------+-----------+----------+--------------+ CFV      Full           Yes      Yes                                 +---------+---------------+---------+-----------+----------+--------------+ SFJ      Full                                                         +---------+---------------+---------+-----------+----------+--------------+ FV Prox  Full                                                        +---------+---------------+---------+-----------+----------+--------------+  FV Mid   Full                                                        +---------+---------------+---------+-----------+----------+--------------+ FV DistalFull                                                        +---------+---------------+---------+-----------+----------+--------------+ PFV      Full                                                        +---------+---------------+---------+-----------+----------+--------------+ POP      Full           Yes      Yes                                 +---------+---------------+---------+-----------+----------+--------------+ PTV      Full                                                        +---------+---------------+---------+-----------+----------+--------------+ PERO     Full                                                        +---------+---------------+---------+-----------+----------+--------------+   +---------+---------------+---------+-----------+----------+--------------+ LEFT     CompressibilityPhasicitySpontaneityPropertiesThrombus Aging +---------+---------------+---------+-----------+----------+--------------+ CFV      Full           Yes      Yes                                 +---------+---------------+---------+-----------+----------+--------------+ SFJ      Full                                                        +---------+---------------+---------+-----------+----------+--------------+ FV Prox  Full                                                        +---------+---------------+---------+-----------+----------+--------------+ FV Mid   Full                                                         +---------+---------------+---------+-----------+----------+--------------+  FV DistalFull                                                        +---------+---------------+---------+-----------+----------+--------------+ PFV      Full                                                        +---------+---------------+---------+-----------+----------+--------------+ POP      Full           Yes      Yes                                 +---------+---------------+---------+-----------+----------+--------------+ PTV      Full                                                        +---------+---------------+---------+-----------+----------+--------------+ PERO     Full                                                        +---------+---------------+---------+-----------+----------+--------------+     Summary: Right: There is no evidence of deep vein thrombosis in the lower extremity. Left: There is no evidence of deep vein thrombosis in the lower extremity.  *See table(s) above for measurements and observations. Electronically signed by Fabienne Bruns MD on 07/25/2019 at 11:01:14 AM.    Final         Scheduled Meds: . aspirin EC  81 mg Oral Daily  . carvedilol  3.125 mg Oral BID WC  . enoxaparin (LOVENOX) injection  40 mg Subcutaneous Q24H  . ferrous sulfate  325 mg Oral Q breakfast  . furosemide  40 mg Intravenous BID  . gabapentin  100 mg Oral TID  . pantoprazole (PROTONIX) IV  40 mg Intravenous Q24H  . sacubitril-valsartan  1 tablet Oral BID  . sertraline  100 mg Oral QHS  . sodium chloride flush  3 mL Intravenous Q12H  . sodium chloride flush  3 mL Intravenous Q12H  . spironolactone  12.5 mg Oral Daily   Continuous Infusions: . sodium chloride    . sodium chloride    . sodium chloride 1,000 mL (07/26/19 0539)     LOS: 2 days   The patient is critically ill with multiple organ systems failure and requires high complexity decision making for assessment and  support, frequent evaluation and titration of therapies, application of advanced monitoring technologies and extensive interpretation of multiple databases. Critical Care Time devoted to patient care services described in this note  Time spent: 40 minutes     Julie Rice, Roselind Messier, MD [facility] Pager [phone]  If 7PM-7AM, please contact night-coverage [email or web address] Password [facility] 07/26/2019, 7:36 AM

## 2019-07-26 NOTE — Progress Notes (Signed)
Progress Note  Patient Name: Julie Rice Date of Encounter: 07/26/2019  Primary Cardiologist: Jenkins Rouge, MD  Subjective   Events noted yesterday evening, including SVT that broke with 12 mg adenosine. She had chest discomfort with this and a burning feeling that resolved after adenosine was given. No further events overnight. Plan for [facility] today.  Creatinine 1.31 (down from 1.44 yesterday) - lasix being held for cath today. She was 1L negative overnight - 4.2L negative.  Inpatient Medications    Scheduled Meds: . aspirin EC  81 mg Oral Daily  . carvedilol  3.125 mg Oral BID WC  . enoxaparin (LOVENOX) injection  40 mg Subcutaneous Q24H  . ferrous sulfate  325 mg Oral Q breakfast  . gabapentin  100 mg Oral TID  . pantoprazole (PROTONIX) IV  40 mg Intravenous Q24H  . sacubitril-valsartan  1 tablet Oral BID  . sertraline  100 mg Oral QHS  . sodium chloride flush  3 mL Intravenous Q12H  . sodium chloride flush  3 mL Intravenous Q12H  . spironolactone  12.5 mg Oral Daily   Continuous Infusions: . sodium chloride    . sodium chloride    . sodium chloride 1,000 mL (07/26/19 0539)   PRN Meds: sodium chloride, sodium chloride, acetaminophen, albuterol, clonazePAM, metoprolol tartrate, ondansetron (ZOFRAN) IV, sodium chloride flush, sodium chloride flush, sucralfate, traMADol   Vital Signs    Vitals:   07/25/19 1941 07/25/19 2354 07/26/19 0343 07/26/19 0715  BP: (!) 111/56 (!) 133/51 (!) 141/70 128/68  Pulse: 94 76 77 73  Resp: 17 14 17 16   Temp: (!) 97.5 F (36.4 C) 98.6 F (37 C) 97.7 F (36.5 C) 97.7 F (36.5 C)  TempSrc: Oral Oral Oral Oral  SpO2: 98% 100% 100% 100%  Weight:   64 kg   Height:        Intake/Output Summary (Last 24 hours) at 07/26/2019 0825 Last data filed at 07/26/2019 0539 Gross per 24 hour  Intake 240 ml  Output 1300 ml  Net -1060 ml   Filed Weights   07/24/19 0500 07/25/19 0420 07/26/19 0343  Weight: 62.8 kg 63.1 kg 64 kg     Telemetry    Sinus rhythm - SVT with underlying IVCD overnight.  Personally reviewed.  ECG   N/A  Physical Exam   General appearance: alert and no distress Neck: no carotid bruit, no JVD and thyroid not enlarged, symmetric, no tenderness/mass/nodules Lungs: clear to auscultation bilaterally Heart: regular rate and rhythm, S1, S2 normal, no murmur, click, rub or gallop Abdomen: soft, non-tender; bowel sounds normal; no masses,  no organomegaly Extremities: extremities normal, atraumatic, no cyanosis or edema Pulses: 2+ and symmetric Skin: Skin color, texture, turgor normal. No rashes or lesions Neurologic: Grossly normal Psych: Pleasant   Labs    Chemistry Recent Labs  Lab 07/22/19 1020  07/25/19 0208 07/25/19 1941 07/26/19 0212  NA 140   < > 138 136 139  K 3.4*   < > 3.4* 4.2 4.0  CL 103   < > 98 100 101  CO2 26   < > 28 25 27   GLUCOSE 108*   < > 99 118* 107*  BUN 11   < > 16 22 20   CREATININE 0.82   < > 0.97 1.44* 1.31*  CALCIUM 8.8*   < > 8.7* 8.3* 8.7*  PROT 6.2*  --   --   --   --   ALBUMIN 3.4*  --   --   --   --  AST 18  --   --   --   --   ALT 13  --   --   --   --   ALKPHOS 66  --   --   --   --   BILITOT 0.7  --   --   --   --   GFRNONAA >60   < > 55* 34* 38*  GFRAA >60   < > >60 39* 44*  ANIONGAP 11   < > 12 11 11   < > = values in this interval not displayed.     Hematology Recent Labs  Lab 07/24/19 0215 07/25/19 0208 07/26/19 0212  WBC 7.3 9.9 9.4  RBC 3.34* 3.68* 3.71*  HGB 9.2* 10.3* 10.2*  HCT 29.6* 32.1* 32.5*  MCV 88.6 87.2 87.6  MCH 27.5 28.0 27.5  MCHC 31.1 32.1 31.4  RDW 14.1 14.0 14.0  PLT 265 272 292    Cardiac Enzymes Recent Labs  Lab 07/22/19 1020 07/22/19 1151  TROPONINIHS 8 8    BNP Recent Labs  Lab 07/22/19 1021  BNP 723.7*     DDimer Recent Labs  Lab 07/22/19 1020  DDIMER 1.74*     Radiology    Vas Us Lower Extremity Venous (dvt)  Result Date: 07/25/2019  Lower Venous Study Indications:  Swelling.  Comparison Study: No prior study on file Performing Technologist: Candace Kanady RVS  Examination Guidelines: A complete evaluation includes B-mode imaging, spectral Doppler, color Doppler, and power Doppler as needed of all accessible portions of each vessel. Bilateral testing is considered an integral part of a complete examination. Limited examinations for reoccurring indications may be performed as noted.  +---------+---------------+---------+-----------+----------+--------------+ RIGHT    CompressibilityPhasicitySpontaneityPropertiesThrombus Aging +---------+---------------+---------+-----------+----------+--------------+ CFV      Full           Yes      Yes                                 +---------+---------------+---------+-----------+----------+--------------+ SFJ      Full                                                        +---------+---------------+---------+-----------+----------+--------------+ FV Prox  Full                                                        +---------+---------------+---------+-----------+----------+--------------+ FV Mid   Full                                                        +---------+---------------+---------+-----------+----------+--------------+ FV DistalFull                                                        +---------+---------------+---------+-----------+----------+--------------+ PFV        Full                                                        +---------+---------------+---------+-----------+----------+--------------+ POP      Full           Yes      Yes                                 +---------+---------------+---------+-----------+----------+--------------+ PTV      Full                                                        +---------+---------------+---------+-----------+----------+--------------+ PERO     Full                                                         +---------+---------------+---------+-----------+----------+--------------+   +---------+---------------+---------+-----------+----------+--------------+ LEFT     CompressibilityPhasicitySpontaneityPropertiesThrombus Aging +---------+---------------+---------+-----------+----------+--------------+ CFV      Full           Yes      Yes                                 +---------+---------------+---------+-----------+----------+--------------+ SFJ      Full                                                        +---------+---------------+---------+-----------+----------+--------------+ FV Prox  Full                                                        +---------+---------------+---------+-----------+----------+--------------+ FV Mid   Full                                                        +---------+---------------+---------+-----------+----------+--------------+ FV DistalFull                                                        +---------+---------------+---------+-----------+----------+--------------+ PFV      Full                                                        +---------+---------------+---------+-----------+----------+--------------+  POP      Full           Yes      Yes                                 +---------+---------------+---------+-----------+----------+--------------+ PTV      Full                                                        +---------+---------------+---------+-----------+----------+--------------+ PERO     Full                                                        +---------+---------------+---------+-----------+----------+--------------+     Summary: Right: There is no evidence of deep vein thrombosis in the lower extremity. Left: There is no evidence of deep vein thrombosis in the lower extremity.  *See table(s) above for measurements and observations. Electronically signed by Fabienne Bruns MD on 07/25/2019  at 11:01:14 AM.    Final     Cardiac Studies   Echocardiogram 07/23/2019: 1. Left ventricular ejection fraction, by visual estimation, is 35 to 40% with diffuse hypokinesis. 2. The left ventricle has moderately decreased function. Normal left ventricular size. There is no left ventricular hypertrophy. 3. Elevated left ventricular end-diastolic pressure. 4. Left ventricular diastolic Doppler parameters are consistent with pseudonormalization pattern of LV diastolic filling. 5. Global right ventricle has normal systolic function.The right ventricular size is normal. No increase in right ventricular wall thickness. 6. Left atrial size was moderately dilated. 7. Right atrial size was mildly dilated. 8. The mitral valve is normal in structure. Moderate mitral valve regurgitation. No evidence of mitral stenosis. 9. The tricuspid valve is normal in structure. Tricuspid valve regurgitation is mild. 10. The aortic valve is normal in structure. Aortic valve regurgitation is mild by color flow Doppler. Structurally normal aortic valve, with no evidence of sclerosis or stenosis. 11. The pulmonic valve was normal in structure. Pulmonic valve regurgitation is mild by color flow Doppler. 12. Mildly elevated pulmonary artery systolic pressure. 13. The inferior vena cava is normal in size with greater than 50% respiratory variability, suggesting right atrial pressure of 3 mmHg. 14. Mild to moderate aortic valve annular calcification.  Patient Profile     83 y.o. female with a history of rheumatoid arthritis, mitral regurgitation, hypertension, hyperlipidemia, GERD, esophageal stricture, COPD, CAD, and bipolar disorder presenting with shortness of breath and newly documented cardiomyopathy with LVEF 35 to 40%.  Assessment & Plan    1.  Secondary cardiomyopathy, LVEF 35 to 40% with diffuse hypokinesis.  She presents with acute combined heart failure, no clear evidence of ACS with negative  high-sensitivity troponin I levels. Suspect this will be a non-ischemic CM. She is diuresing on IV Lasix, approximately 1000 cc out more than in last 24 hours. HF meds include carvedilol, lasix, and entresto (24/26). On for right and left heart catheterization is scheduled for today.  2.  Intermittent palpitations with PSVT overnight - required adenosine. Uptitrate beta-blocker as tolerated.  3.  Chronic anemia with hemoglobin in the 9-10 range.  No obvious bleeding.  4.  Essential hypertension.  Kenneth C. Hilty, MD, FACC, FACP  [place]  [facility]  Medical Director of the [facility] Diplomate of the American Board of Clinical Lipidology Attending Cardiologist  Direct Dial: [phone]  Fax: [phone]  Website:  [email or web address]  Kenneth C Hilty, MD  07/26/2019, 8:25 AM   

## 2019-07-26 NOTE — CV Procedure (Signed)
   Left heart cath and right heart cath via right radial artery and antecubital vein respectively.  Real-time vascular ultrasound used to achieve access of the radial artery.  Widely patent coronary arteries.  Low right heart and LV filling pressures.  Poor quality left ventriculogram due to ectopy.  LVEDP 4 mmHg.

## 2019-07-26 NOTE — Progress Notes (Signed)
Physical Therapy Treatment Patient Details Name: Julie Rice MRN: [id number] DOB: July 11, 1936 Today's Date: 07/26/2019    History of Present Illness Pt is an 83 y.o. female admitted 07/22/19 with c/o several weeks of weakness, SOB and epigastric/chest pain. Worked up for new onset systolic HF. CT negative for PE. Plan for R/L heart cath on 9/21. PMH includes RA, HTN, esophageal stricture, COPD, bipolar disorder.    PT Comments    Pt admitted with above diagnosis. Pt was unable to progress to ambulation as she is still orthostatic and slightly symptomatic as well.  Notified nursing. Pt going to cath lab therefore treatment cut short.  Pt currently with functional limitations due to balance and endurance deficits. Pt will benefit from skilled PT to increase their independence and safety with mobility to allow discharge to the venue listed below.    Orthostatic BPs  Supine 153/61, 77 bpm  Sitting 120/105, 82 bpm  Standing 100/66, 89 bpm  Standing after 3 min Unable to assess due to dizziness    Follow Up Recommendations  Home health PT;Supervision/Assistance - 24 hour     Equipment Recommendations  None recommended by PT    Recommendations for Other Services       Precautions / Restrictions Precautions Precautions: Fall Precaution Comments: Orthostatic hypotension 9/19 and 9/21 Restrictions Weight Bearing Restrictions: No    Mobility  Bed Mobility Overal bed mobility: Independent                Transfers Overall transfer level: Needs assistance Equipment used: None Transfers: Sit to/from Stand Sit to Stand: Supervision         General transfer comment: Pt with c/o of dyspnea but not very noticeable.  Pt was orthostatic with slight symptoms therefore had pt lie back down and notified nursing.   Ambulation/Gait                 Stairs             Wheelchair Mobility    Modified Rankin (Stroke Patients Only)       Balance Overall balance  assessment: Needs assistance Sitting-balance support: Feet supported;No upper extremity supported Sitting balance-Leahy Scale: Fair     Standing balance support: During functional activity;No upper extremity supported Standing balance-Leahy Scale: Fair Standing balance comment: can stand statically without UEs upport                             Cognition Arousal/Alertness: Awake/alert Behavior During Therapy: WFL for tasks assessed/performed Overall Cognitive Status: Within Functional Limits for tasks assessed                                        Exercises      General Comments        Pertinent Vitals/Pain Pain Assessment: No/denies pain    Home Living                      Prior Function            PT Goals (current goals can now be found in the care plan section) Acute Rehab PT Goals Patient Stated Goal: to feel better  Progress towards PT goals: Progressing toward goals    Frequency    Min 3X/week      PT Plan Current plan remains appropriate  Co-evaluation              AM-PAC PT "6 Clicks" Mobility   Outcome Measure  Help needed turning from your back to your side while in a flat bed without using bedrails?: None Help needed moving from lying on your back to sitting on the side of a flat bed without using bedrails?: None Help needed moving to and from a bed to a chair (including a wheelchair)?: A Little Help needed standing up from a chair using your arms (e.g., wheelchair or bedside chair)?: A Little Help needed to walk in hospital room?: A Little Help needed climbing 3-5 steps with a railing? : A Little 6 Click Score: 20    End of Session Equipment Utilized During Treatment: Gait belt Activity Tolerance: Patient limited by fatigue Patient left: with call bell/phone within reach;in bed;with family/visitor present Nurse Communication: Mobility status PT Visit Diagnosis: Other abnormalities of gait and  mobility (R26.89)     Time: 6286-3817 PT Time Calculation (min) (ACUTE ONLY): 11 min  Charges:  $Therapeutic Activity: 8-22 mins                     Laurelle Skiver,PT Acute Rehabilitation Services Pager:  [phone]  Office:  [place] 07/26/2019, 12:45 PM

## 2019-07-26 NOTE — Interval H&P Note (Signed)
Cath Lab Visit (complete for each Cath Lab visit)  Clinical Evaluation Leading to the Procedure:   ACS: No.  Non-ACS:    Anginal Classification: CCS III  Anti-ischemic medical therapy: Maximal Therapy (2 or more classes of medications)  Non-Invasive Test Results: No non-invasive testing performed  Prior CABG: No previous CABG      History and Physical Interval Note:  07/26/2019 2:11 PM  Julie Rice  has presented today for surgery, with the diagnosis of heart failure.  The various methods of treatment have been discussed with the patient and family. After consideration of risks, benefits and other options for treatment, the patient has consented to  Procedure(s): RIGHT/LEFT HEART CATH AND CORONARY ANGIOGRAPHY (N/A) as a surgical intervention.  The patient's history has been reviewed, patient examined, no change in status, stable for surgery.  I have reviewed the patient's chart and labs.  Questions were answered to the patient's satisfaction.     Belva Crome III

## 2019-07-26 NOTE — Plan of Care (Signed)
  Problem: Education: Goal: Knowledge of General Education information will improve Description: Including pain rating scale, medication(s)/side effects and non-pharmacologic comfort measures Outcome: Progressing   Problem: Health Behavior/Discharge Planning: Goal: Ability to manage health-related needs will improve Outcome: Progressing   Problem: Clinical Measurements: Goal: Ability to maintain clinical measurements within normal limits will improve Outcome: Progressing Goal: Respiratory complications will improve Outcome: Progressing Goal: Cardiovascular complication will be avoided Outcome: Progressing   Problem: Coping: Goal: Level of anxiety will decrease Outcome: Progressing   Problem: Elimination: Goal: Will not experience complications related to bowel motility Outcome: Progressing

## 2019-07-26 NOTE — H&P (View-Only) (Signed)
Progress Note  Patient Name: Julie Rice Date of Encounter: 07/26/2019  Primary Cardiologist: Jenkins Rouge, MD  Subjective   Events noted yesterday evening, including SVT that broke with 12 mg adenosine. She had chest discomfort with this and a burning feeling that resolved after adenosine was given. No further events overnight. Plan for [facility] today.  Creatinine 1.31 (down from 1.44 yesterday) - lasix being held for cath today. She was 1L negative overnight - 4.2L negative.  Inpatient Medications    Scheduled Meds: . aspirin EC  81 mg Oral Daily  . carvedilol  3.125 mg Oral BID WC  . enoxaparin (LOVENOX) injection  40 mg Subcutaneous Q24H  . ferrous sulfate  325 mg Oral Q breakfast  . gabapentin  100 mg Oral TID  . pantoprazole (PROTONIX) IV  40 mg Intravenous Q24H  . sacubitril-valsartan  1 tablet Oral BID  . sertraline  100 mg Oral QHS  . sodium chloride flush  3 mL Intravenous Q12H  . sodium chloride flush  3 mL Intravenous Q12H  . spironolactone  12.5 mg Oral Daily   Continuous Infusions: . sodium chloride    . sodium chloride    . sodium chloride 1,000 mL (07/26/19 0539)   PRN Meds: sodium chloride, sodium chloride, acetaminophen, albuterol, clonazePAM, metoprolol tartrate, ondansetron (ZOFRAN) IV, sodium chloride flush, sodium chloride flush, sucralfate, traMADol   Vital Signs    Vitals:   07/25/19 1941 07/25/19 2354 07/26/19 0343 07/26/19 0715  BP: (!) 111/56 (!) 133/51 (!) 141/70 128/68  Pulse: 94 76 77 73  Resp: 17 14 17 16   Temp: (!) 97.5 F (36.4 C) 98.6 F (37 C) 97.7 F (36.5 C) 97.7 F (36.5 C)  TempSrc: Oral Oral Oral Oral  SpO2: 98% 100% 100% 100%  Weight:   64 kg   Height:        Intake/Output Summary (Last 24 hours) at 07/26/2019 0825 Last data filed at 07/26/2019 0539 Gross per 24 hour  Intake 240 ml  Output 1300 ml  Net -1060 ml   Filed Weights   07/24/19 0500 07/25/19 0420 07/26/19 0343  Weight: 62.8 kg 63.1 kg 64 kg     Telemetry    Sinus rhythm - SVT with underlying IVCD overnight.  Personally reviewed.  ECG   N/A  Physical Exam   General appearance: alert and no distress Neck: no carotid bruit, no JVD and thyroid not enlarged, symmetric, no tenderness/mass/nodules Lungs: clear to auscultation bilaterally Heart: regular rate and rhythm, S1, S2 normal, no murmur, click, rub or gallop Abdomen: soft, non-tender; bowel sounds normal; no masses,  no organomegaly Extremities: extremities normal, atraumatic, no cyanosis or edema Pulses: 2+ and symmetric Skin: Skin color, texture, turgor normal. No rashes or lesions Neurologic: Grossly normal Psych: Pleasant   Labs    Chemistry Recent Labs  Lab 07/22/19 1020  07/25/19 0208 07/25/19 1941 07/26/19 0212  NA 140   < > 138 136 139  K 3.4*   < > 3.4* 4.2 4.0  CL 103   < > 98 100 101  CO2 26   < > 28 25 27   GLUCOSE 108*   < > 99 118* 107*  BUN 11   < > 16 22 20   CREATININE 0.82   < > 0.97 1.44* 1.31*  CALCIUM 8.8*   < > 8.7* 8.3* 8.7*  PROT 6.2*  --   --   --   --   ALBUMIN 3.4*  --   --   --   --  AST 18  --   --   --   --   ALT 13  --   --   --   --   ALKPHOS 66  --   --   --   --   BILITOT 0.7  --   --   --   --   GFRNONAA >60   < > 55* 34* 38*  GFRAA >60   < > >60 39* 44*  ANIONGAP 11   < > 12 11 11    < > = values in this interval not displayed.     Hematology Recent Labs  Lab 07/24/19 0215 07/25/19 0208 07/26/19 0212  WBC 7.3 9.9 9.4  RBC 3.34* 3.68* 3.71*  HGB 9.2* 10.3* 10.2*  HCT 29.6* 32.1* 32.5*  MCV 88.6 87.2 87.6  MCH 27.5 28.0 27.5  MCHC 31.1 32.1 31.4  RDW 14.1 14.0 14.0  PLT 265 272 292    Cardiac Enzymes Recent Labs  Lab 07/22/19 1020 07/22/19 1151  TROPONINIHS 8 8    BNP Recent Labs  Lab 07/22/19 1021  BNP 723.7*     DDimer Recent Labs  Lab 07/22/19 1020  DDIMER 1.74*     Radiology    Vas [place] Lower Extremity Venous (dvt)  Result Date: 07/25/2019  Lower Venous Study Indications:  Swelling.  Comparison Study: No prior study on file Performing Technologist: Sherren Kerns RVS  Examination Guidelines: A complete evaluation includes B-mode imaging, spectral Doppler, color Doppler, and power Doppler as needed of all accessible portions of each vessel. Bilateral testing is considered an integral part of a complete examination. Limited examinations for reoccurring indications may be performed as noted.  +---------+---------------+---------+-----------+----------+--------------+ RIGHT    CompressibilityPhasicitySpontaneityPropertiesThrombus Aging +---------+---------------+---------+-----------+----------+--------------+ CFV      Full           Yes      Yes                                 +---------+---------------+---------+-----------+----------+--------------+ SFJ      Full                                                        +---------+---------------+---------+-----------+----------+--------------+ FV Prox  Full                                                        +---------+---------------+---------+-----------+----------+--------------+ FV Mid   Full                                                        +---------+---------------+---------+-----------+----------+--------------+ FV DistalFull                                                        +---------+---------------+---------+-----------+----------+--------------+ PFV  Full                                                        +---------+---------------+---------+-----------+----------+--------------+ POP      Full           Yes      Yes                                 +---------+---------------+---------+-----------+----------+--------------+ PTV      Full                                                        +---------+---------------+---------+-----------+----------+--------------+ PERO     Full                                                         +---------+---------------+---------+-----------+----------+--------------+   +---------+---------------+---------+-----------+----------+--------------+ LEFT     CompressibilityPhasicitySpontaneityPropertiesThrombus Aging +---------+---------------+---------+-----------+----------+--------------+ CFV      Full           Yes      Yes                                 +---------+---------------+---------+-----------+----------+--------------+ SFJ      Full                                                        +---------+---------------+---------+-----------+----------+--------------+ FV Prox  Full                                                        +---------+---------------+---------+-----------+----------+--------------+ FV Mid   Full                                                        +---------+---------------+---------+-----------+----------+--------------+ FV DistalFull                                                        +---------+---------------+---------+-----------+----------+--------------+ PFV      Full                                                        +---------+---------------+---------+-----------+----------+--------------+  POP      Full           Yes      Yes                                 +---------+---------------+---------+-----------+----------+--------------+ PTV      Full                                                        +---------+---------------+---------+-----------+----------+--------------+ PERO     Full                                                        +---------+---------------+---------+-----------+----------+--------------+     Summary: Right: There is no evidence of deep vein thrombosis in the lower extremity. Left: There is no evidence of deep vein thrombosis in the lower extremity.  *See table(s) above for measurements and observations. Electronically signed by Fabienne Bruns MD on 07/25/2019  at 11:01:14 AM.    Final     Cardiac Studies   Echocardiogram 07/23/2019: 1. Left ventricular ejection fraction, by visual estimation, is 35 to 40% with diffuse hypokinesis. 2. The left ventricle has moderately decreased function. Normal left ventricular size. There is no left ventricular hypertrophy. 3. Elevated left ventricular end-diastolic pressure. 4. Left ventricular diastolic Doppler parameters are consistent with pseudonormalization pattern of LV diastolic filling. 5. Global right ventricle has normal systolic function.The right ventricular size is normal. No increase in right ventricular wall thickness. 6. Left atrial size was moderately dilated. 7. Right atrial size was mildly dilated. 8. The mitral valve is normal in structure. Moderate mitral valve regurgitation. No evidence of mitral stenosis. 9. The tricuspid valve is normal in structure. Tricuspid valve regurgitation is mild. 10. The aortic valve is normal in structure. Aortic valve regurgitation is mild by color flow Doppler. Structurally normal aortic valve, with no evidence of sclerosis or stenosis. 11. The pulmonic valve was normal in structure. Pulmonic valve regurgitation is mild by color flow Doppler. 12. Mildly elevated pulmonary artery systolic pressure. 13. The inferior vena cava is normal in size with greater than 50% respiratory variability, suggesting right atrial pressure of 3 mmHg. 14. Mild to moderate aortic valve annular calcification.  Patient Profile     83 y.o. female with a history of rheumatoid arthritis, mitral regurgitation, hypertension, hyperlipidemia, GERD, esophageal stricture, COPD, CAD, and bipolar disorder presenting with shortness of breath and newly documented cardiomyopathy with LVEF 35 to 40%.  Assessment & Plan    1.  Secondary cardiomyopathy, LVEF 35 to 40% with diffuse hypokinesis.  She presents with acute combined heart failure, no clear evidence of ACS with negative  high-sensitivity troponin I levels. Suspect this will be a non-ischemic CM. She is diuresing on IV Lasix, approximately 1000 cc out more than in last 24 hours. HF meds include carvedilol, lasix, and entresto (24/26). On for right and left heart catheterization is scheduled for today.  2.  Intermittent palpitations with PSVT overnight - required adenosine. Uptitrate beta-blocker as tolerated.  3.  Chronic anemia with hemoglobin in the 9-10 range.  No obvious bleeding.  4.  Essential hypertension.  Chrystie Nose, MD, [facility], FACP  [place]  [facility]  Medical Director of the [facility]  [facility] Diplomate of the American Board of Clinical Lipidology Attending Cardiologist  Direct Dial: [phone]  Fax: [phone]  Website:  [email or web address]  Chrystie Nose, MD  07/26/2019, 8:25 AM

## 2019-07-27 ENCOUNTER — Encounter (HOSPITAL_COMMUNITY): Payer: Self-pay | Admitting: Interventional Cardiology

## 2019-07-27 DIAGNOSIS — I255 Ischemic cardiomyopathy: Secondary | ICD-10-CM

## 2019-07-27 DIAGNOSIS — R0602 Shortness of breath: Secondary | ICD-10-CM

## 2019-07-27 DIAGNOSIS — E038 Other specified hypothyroidism: Secondary | ICD-10-CM

## 2019-07-27 DIAGNOSIS — I5021 Acute systolic (congestive) heart failure: Secondary | ICD-10-CM

## 2019-07-27 LAB — CBC
HCT: 29.4 % — ABNORMAL LOW (ref 36.0–46.0)
Hemoglobin: 9.2 g/dL — ABNORMAL LOW (ref 12.0–15.0)
MCH: 27.6 pg (ref 26.0–34.0)
MCHC: 31.3 g/dL (ref 30.0–36.0)
MCV: 88.3 fL (ref 80.0–100.0)
Platelets: 274 10*3/uL (ref 150–400)
RBC: 3.33 MIL/uL — ABNORMAL LOW (ref 3.87–5.11)
RDW: 14 % (ref 11.5–15.5)
WBC: 8.7 10*3/uL (ref 4.0–10.5)
nRBC: 0 % (ref 0.0–0.2)

## 2019-07-27 LAB — BASIC METABOLIC PANEL
Anion gap: 8 (ref 5–15)
BUN: 17 mg/dL (ref 8–23)
CO2: 26 mmol/L (ref 22–32)
Calcium: 8.5 mg/dL — ABNORMAL LOW (ref 8.9–10.3)
Chloride: 105 mmol/L (ref 98–111)
Creatinine, Ser: 0.8 mg/dL (ref 0.44–1.00)
GFR calc Af Amer: >60 mL/min (ref >60)
GFR calc non Af Amer: >60 mL/min (ref >60)
Glucose, Bld: 104 mg/dL — ABNORMAL HIGH (ref 70–99)
Potassium: 4.1 mmol/L (ref 3.5–5.1)
Sodium: 139 mmol/L (ref 135–145)

## 2019-07-27 LAB — MAGNESIUM: Magnesium: 2 mg/dL (ref 1.7–2.4)

## 2019-07-27 MED ORDER — ACETAMINOPHEN 325 MG PO TABS: 650.0000 mg | ORAL_TABLET | ORAL | 0 refills | Status: DC | PRN

## 2019-07-27 MED ORDER — PANTOPRAZOLE SODIUM 40 MG PO TBEC
40.0000 mg | DELAYED_RELEASE_TABLET | Freq: Every day | ORAL | Status: DC
  Administered 2019-07-27: 40 mg via ORAL
  Filled 2019-07-27: qty 1

## 2019-07-27 MED ORDER — FERROUS SULFATE 325 (65 FE) MG PO TABS: 325.0000 mg | ORAL_TABLET | Freq: Every day | ORAL | 0 refills | Status: DC

## 2019-07-27 MED ORDER — SPIRONOLACTONE 25 MG PO TABS: 12.5000 mg | ORAL_TABLET | Freq: Every day | ORAL | 0 refills | Status: DC

## 2019-07-27 MED ORDER — ASPIRIN 81 MG PO TBEC: 81.0000 mg | DELAYED_RELEASE_TABLET | Freq: Every day | ORAL | 0 refills | Status: DC

## 2019-07-27 MED ORDER — SACUBITRIL-VALSARTAN 24-26 MG PO TABS: 1.0000 | ORAL_TABLET | Freq: Two times a day (BID) | ORAL | 0 refills | Status: DC

## 2019-07-27 MED ORDER — CARVEDILOL 3.125 MG PO TABS
3.1250 mg | ORAL_TABLET | Freq: Once | ORAL | Status: AC
  Administered 2019-07-27: 3.125 mg via ORAL
  Filled 2019-07-27: qty 1

## 2019-07-27 NOTE — Plan of Care (Signed)
Progressing

## 2019-07-27 NOTE — Discharge Summary (Addendum)
Physician Discharge Summary  Julie Rice:[id number] DOB: 03/09/1936 DOA: 07/22/2019  PCP: [name], [name], NP  Admit date: 07/22/2019 Discharge date: 07/27/2019  Time spent: 30 minutes  Recommendations for Outpatient Follow-up:   Acute systolic CHF/cardiomyopathy -Strict in and out  -3.9 L -Daily weight      Filed Weights   07/24/19 0500 07/25/19 0420 07/26/19 0343  Weight: 62.8 kg 63.1 kg 64 kg  -Troponin high-sensitivity x2 negative -EKG LBBB (old) -PCXR; pulmonary edema -CTA negative for PE -Echo; EF 35 to 40% which is new per cardiology see results below -9/21 s/p RIGHT/LEFT heart catheterization ; see results below -ASA 81 mg daily -Amlodipine 2.5 mg daily - Coreg 3.125 mg BID; NOTE prior to discharge we will give patient extra dose of Coreg 3.125 x1 - Entresto 24-26 mg/tab. BID -Spironolactone 12.5 mg daily -Will allow patient's regular cardiologist to make adjustments to medication at follow-up. - Schedule follow-up appointment in 4 weeks with Dr. Charlton HawsPeter Nishan cardiology, new onset systolic CHF/cardiomyopathy  Essential HTN -See CHF  Hypokalemia - Potassium goal>4 -K-Dur 50 mEq  Hypothyroidism -TSH WNL - Not on Synthroid per home medication list  Asthma?/COPD/Adenopathy -Albuterol PRN -Emphysematous change on CTA see results below -Ambulatory SPO2 pending -Schedule establish care 2 weeks with Dr. Kalman ShanMurali Ramaswamy spirometry, pre-/post bronchodilator, DLCO.  Reevaluate CTA 9/17 patient in low risk category therefore by Fleischner criteria adenopathy and subcentimeter nodules should need no further work-up but will defer to PCCM.  Chronic respiratory failure with hypoxia SATURATION QUALIFICATIONS: (This note is used to comply with regulatory documentation for home oxygen) Patient Saturations on Room Air at Rest = 98 % Patient Saturations on Room Air while Ambulating = 87-89% Patient Saturations on 2 Liters of oxygen while Ambulating = 95% Please  briefly explain why patient needs home oxygen: with shortness on exertion. - 2 L O2 via [place] during exertion.  Titrate O2 to maintain SPO2 89%-93% -Provide Inogen home O2 concentrator  Elevated d-dimer -CTA negative for PE -Lower extremity Doppler negative   Chronic normocytic anemia/iron deficiency anemia -Hemoglobin at baseline -Anemia panelshowed iron 17, sats 5, TIBC 361, ferritin 24, folate 24, vitamin B12 413 Gave 1 dose ofFeraheme,continue supplemental oral iron  Esophageal dysmotility -Status post fundoplication with post fundoplication dysphasia secondary to dysmotility -Has chronic cough -Continue Protonix, Carafate -Follow-up with GI plan for repeat EGD with Botox and balloon dilation next month or sooner if needed.   -Per patient has GI appointment already scheduled.  Generalized anxiety disorder/bipolar disorder - Clonazepam 0.5 mg BID PRN -Zoloft 100 mg qhs   Discharge Diagnoses:  Principal Problem:   Dyspnea Active Problems:   Hypothyroidism   Dyslipidemia   Essential hypertension   Generalized anxiety disorder   Esophageal dysmotility   Asthma   Chest pain of uncertain etiology   SOB (shortness of breath)   CHF (congestive heart failure) (HCC)   Discharge Condition: Stable  Diet recommendation: Heart healthy  Filed Weights   07/25/19 0420 07/26/19 0343 07/27/19 0026  Weight: 63.1 kg 64 kg 63.7 kg    History of present illness:  83 y.o.WF PMHx  Bipolar, TIA; TA; HTN; HLD; fibromyalgia with chronic pain; COPD; CAD;   Presenting with SOB.She reports significant SOB. She has been weak and SOB. She had an appointment with Dr. Eden EmmsNishan and they said she should come to the ER. She has left flank pain. SOB is with ambulation, not bad on short distances. She has nighttime cough, waking her up about 3AM. She feels a  rattling in her throat without wheezing. Cough is nonproductive. No fevers. No weight change. Mild LE edema in the late evening  or night. 2 pillow orthopnea. No PND but cough wakes her up at night. She uses her inhaler in the AM with some relief.   Hospital Course:  During his hospitalization patient was evaluated for acute onset of S OB.  Patient was evaluated with RIGHT/left heart catheterization, coronary arteries were found to have no significant stenosis see results below.  However patient was found to have new onset systolic CHF.  In addition CTA PE protocol found patient to have areas of adenopathy that will need further evaluation by her PCP  Procedures: 9/17 CTA PE protocol:-Negative PE  -Prominence of the main pulmonary outflow tract, a finding felt to indicate PAH  -Areas of adenopathy noted with increased lymph node prominence compared to prior studies. Etiology for this adenopathy uncertain. Neoplastic etiology cannot be excluded. -Small pleural effusions bilaterally with bibasilar atelectasis. Underlying centrilobular emphysematous change. -Scattered subcentimeter pulmonary nodules remain overall stable. -Hiatal hernia present. Esophagus appears distended containing fluid and air. Suspect esophageal motility disorder. -Reflux of contrast into the inferior vena cava and hepatic veins likely indicates increased right heart pressure. 9/18 echocardiogram:Left Ventricle: LVEF:35 to 40%. Moderately decreased function.  -Diastolic Doppler parameters are consistent with pseudonormalization pattern of LV diastolic filling. Elevated left ventricular end-diastolic pressure. -Left Atrium:moderately dilated. -Mitral Valve: Moderate mitral valve regurgitation. 9/21 RIGHT/LEFT heart catheterization;-right dominant coronary anatomy -Widely patent left main 30 to 40% mid LAD -Widely patent circumflex -Luminal irregularities in a dominant right coronary. -Right heart pressures are relatively low. Pulmonary wedge pressure mean is 2 mmHg. LVEDP 4 mmHg. Left ventriculography by hand-injection was not helpful. From  images obtained, LV function appears relatively normal.   Consultations: Cardiology   Cultures  9/17 MRSA by PCR negative  9/17 SARS coronavirus negative      Discharge Exam: Vitals:   07/26/19 2336 07/27/19 0026 07/27/19 0415 07/27/19 0715  BP: (!) 145/61  (!) 160/70 (!) 153/82  Pulse:   78 81  Resp:   15 16  Temp: 97.8 F (36.6 C)  97.6 F (36.4 C) 98.7 F (37.1 C)  TempSrc: Oral  Oral Oral  SpO2:   98% 99%  Weight:  63.7 kg    Height:        General: A/O x4, no acute respiratory distress Eyes: negative scleral hemorrhage, negative anisocoria, negative icterus ENT: Negative Runny nose, negative gingival bleeding, Neck:  Negative scars, masses, torticollis, lymphadenopathy, JVD Lungs: Clear to auscultation bilaterally without wheezes or crackles Cardiovascular: Regular rate and rhythm without murmur gallop or rub normal S1 and S2   Discharge Instructions   Allergies as of 07/27/2019      Reactions   Morphine Other (See Comments)   Flushing, rash, itching   Tape Other (See Comments)   Band aides, adhesive tape Redness and pulls skin off      Medication List    STOP taking these medications   aspirin 81 MG tablet Replaced by: aspirin 81 MG EC tablet     TAKE these medications   acetaminophen 325 MG tablet Commonly known as: TYLENOL Take 2 tablets (650 mg total) by mouth every 4 (four) hours as needed for headache or mild pain. What changed:   medication strength  how much to take  when to take this  reasons to take this   albuterol 108 (90 Base) MCG/ACT inhaler Commonly known as: VENTOLIN HFA Inhale 2 puffs  into the lungs every 6 (six) hours as needed for wheezing or shortness of breath.   amLODipine 2.5 MG tablet Commonly known as: NORVASC Take 2.5 mg by mouth daily.   aspirin 81 MG EC tablet Take 1 tablet (81 mg total) by mouth daily. Start taking on: July 28, 2019 Replaces: aspirin 81 MG tablet   carvedilol 3.125 MG  tablet Commonly known as: COREG Take 1 tablet (3.125 mg total) by mouth 2 (two) times daily with a meal.   clonazePAM 0.5 MG tablet Commonly known as: KLONOPIN TAKE 1 TABLET BY MOUTH TWICE A DAY AS NEEDED FOR ANXIETY   ferrous sulfate 325 (65 FE) MG tablet Take 1 tablet (325 mg total) by mouth daily with breakfast. Start taking on: July 28, 2019   gabapentin 100 MG capsule Commonly known as: NEURONTIN TAKE 1 CAPSULE BY MOUTH THREE TIMES A DAY   meloxicam 15 MG tablet Commonly known as: MOBIC Take 1 tablet (15 mg total) by mouth daily.   multivitamin tablet Take 1 tablet by mouth daily. Vitamin supplement.   pantoprazole 40 MG tablet Commonly known as: PROTONIX Take 1 tablet (40 mg total) by mouth 2 (two) times daily.   sacubitril-valsartan 24-26 MG Commonly known as: ENTRESTO Take 1 tablet by mouth 2 (two) times daily.   sertraline 100 MG tablet Commonly known as: ZOLOFT TAKE 1 TABLET BY MOUTH EVERYDAY AT BEDTIME   spironolactone 25 MG tablet Commonly known as: ALDACTONE Take 0.5 tablets (12.5 mg total) by mouth daily. Start taking on: July 28, 2019   sucralfate 1 g tablet Commonly known as: CARAFATE Take 1 tablet (1 g total) by mouth 4 (four) times daily. Dissolve tablet in 10 ml warm water What changed:   when to take this  reasons to take this   tetrahydrozoline 0.05 % ophthalmic solution Place 1 drop into both eyes daily.   traMADol 50 MG tablet Commonly known as: ULTRAM Take 1 tablet (50 mg total) by mouth every 12 (twelve) hours as needed.            Durable Medical Equipment  (From admission, onward)         Start     Ordered   07/27/19 0959  For home use only DME oxygen  Once    Comments: SATURATION QUALIFICATIONS: (This note is used to comply with regulatory documentation for home oxygen) Patient Saturations on Room Air at Rest = 98 % Patient Saturations on Room Air while Ambulating = 87-89% Patient Saturations on 2 Liters of  oxygen while Ambulating = 95% Please briefly explain why patient needs home oxygen: with shortness on exertion. - 2 L O2 via [place] during exertion.  Titrate O2 to maintain SPO2 89%-93% -Provide Inogen home O2 concentrator  Question Answer Comment  Length of Need Lifetime   Mode or (Route) Nasal cannula   Frequency Continuous (stationary and portable oxygen unit needed)   Oxygen conserving device Yes   Oxygen delivery system Gas      07/27/19 0959         Allergies  Allergen Reactions  . Morphine Other (See Comments)    Flushing, rash, itching  . Tape Other (See Comments)    Band aides, adhesive tape Redness and pulls skin off   Follow-up Information    [facility] Follow up.   Specialty: Home Health Services Why: [facility] will reach out to schedule Home Health PT services.  Contact information: [address] [place]  [address] [phone]        Wendall Stade, MD. Schedule an appointment as soon as possible for a visit in 4 week(s).   Specialty: Cardiology Why: Schedule follow-up appointment in 4 weeks with Dr. Charlton Haws cardiology, new onset systolic CHF/cardiomyopathy Contact information: [address] [place] [address] [phone]        Please follow up.   Why: For 08/27/2019        Kalman Shan, MD. Schedule an appointment as soon as possible for a visit in 2 week(s).   Specialty: Pulmonary Disease Why: Schedule establish care 2 weeks with Dr. Kalman Shan spirometry, pre-/post bronchodilator, DLCO.  COPD on CT untreated. Reevaluate CTA 9/17 patient in low risk category therefore by Fleischner criteria adenopathy and subcentimeter nodules should nee Contact information: [address] [place] [address] [phone]            The results of significant diagnostics from this hospitalization (including imaging, microbiology, ancillary and laboratory) are listed below for  reference.    Significant Diagnostic Studies: Ct Angio Chest Pe W And/or Wo Contrast  Result Date: 07/22/2019 CLINICAL DATA:  Shortness of breath and chest pain EXAM: CT ANGIOGRAPHY CHEST WITH CONTRAST TECHNIQUE: Multidetector CT imaging of the chest was performed using the standard protocol during bolus administration of intravenous contrast. Multiplanar CT image reconstructions and MIPs were obtained to evaluate the vascular anatomy. CONTRAST:  OMNIPAQUE IOHEXOL 350 MG/ML SOLN COMPARISON:  Chest radiograph July 22, 2019 and CT angiogram chest February 04, 2019; high-resolution chest CT June 01, 2019 FINDINGS: Cardiovascular: There is no demonstrable pulmonary embolus. There is no thoracic aortic aneurysm or dissection. The visualized great vessels show occasional foci of calcification. There is aortic atherosclerosis. There are scattered foci of coronary artery calcification. There is no pericardial effusion or pericardial thickening. The main pulmonary outflow tract measures 3.1 cm, prominent. Mediastinum/Nodes: No thyroid lesions are evident. There is a right hilar lymph node measuring 1.4 x 1.4 cm. There is subcarinal adenopathy measuring 2.8 x 2.1 cm. There is an aortopulmonary window lymph node measuring 1.6 x 1.2 cm. Esophagus remains distended with fluid and air throughout the esophagus. There is a focal hiatal hernia. Lungs/Pleura: There is underlying centrilobular emphysematous change. There are focal pleural effusions bilaterally with atelectatic change in each lung base. There is septal thickening in the lung bases with areas of reticulonodular interstitial prominence in the lower lobes. There are scattered areas of peribronchovascular ground-glass type appearance, similar to prior studies. Scattered subcentimeter pulmonary nodular lesions overall remain stable. Note that a 6 mm nodular opacity in the left upper lobe posterior segment noted previously is no longer evident. Upper Abdomen:  There is reflux of contrast into the inferior vena cava and hepatic veins. There is upper abdominal aortic atherosclerosis. Visualized upper abdominal structures otherwise appear unremarkable. Musculoskeletal: There is extensive arthropathy throughout the lower thoracic region. No blastic or lytic bone lesions are evident. Postoperative change in upper lumbar spine. No chest wall lesions are evident. Review of the MIP images confirms the above findings. IMPRESSION: 1. No demonstrable pulmonary embolus. No thoracic aortic aneurysm or dissection. There is aortic atherosclerosis as well as foci of coronary artery calcification. 2. Prominence of the main pulmonary outflow tract, a finding felt to indicate a degree of pulmonary arterial hypertension. 3. Areas of adenopathy noted with increased lymph node prominence compared to prior studies. Etiology for this adenopathy uncertain. Neoplastic etiology cannot be excluded. 4. Small pleural  effusions bilaterally with bibasilar atelectasis. Underlying centrilobular emphysematous change. Areas of reticulonodular interstitial prominence and patchy peribronchovascular ground-glass type appearance. Scattered subcentimeter pulmonary nodules remain overall stable. No frank consolidation. 5. Hiatal hernia present. Esophagus appears distended containing fluid and air. Suspect esophageal motility disorder. 6. Reflux of contrast into the inferior vena cava and hepatic veins likely indicates increased right heart pressure. Aortic Atherosclerosis (ICD10-I70.0) and Emphysema (ICD10-J43.9). Electronically Signed   By: Bretta Bang III M.D.   On: 07/22/2019 14:39   Dg Chest Portable 1 View  Result Date: 07/22/2019 CLINICAL DATA:  Shortness of breath. EXAM: PORTABLE CHEST 1 VIEW COMPARISON:  Mar 09, 2019 FINDINGS: Enlarged cardiac silhouette. Calcific atherosclerotic disease of the aorta. Lower lobe predominant interstitial and alveolar opacities. Osseous structures are without acute  abnormality. Soft tissues are grossly normal. IMPRESSION: Lower lobe predominant interstitial and alveolar opacities may represent mixed pattern pulmonary edema. Electronically Signed   By: Ted Mcalpine M.D.   On: 07/22/2019 10:25   Vas [place] Lower Extremity Venous (dvt)  Result Date: 07/25/2019  Lower Venous Study Indications: Swelling.  Comparison Study: No prior study on file Performing Technologist: Sherren Kerns RVS  Examination Guidelines: A complete evaluation includes B-mode imaging, spectral Doppler, color Doppler, and power Doppler as needed of all accessible portions of each vessel. Bilateral testing is considered an integral part of a complete examination. Limited examinations for reoccurring indications may be performed as noted.  +---------+---------------+---------+-----------+----------+--------------+ RIGHT    CompressibilityPhasicitySpontaneityPropertiesThrombus Aging +---------+---------------+---------+-----------+----------+--------------+ CFV      Full           Yes      Yes                                 +---------+---------------+---------+-----------+----------+--------------+ SFJ      Full                                                        +---------+---------------+---------+-----------+----------+--------------+ FV Prox  Full                                                        +---------+---------------+---------+-----------+----------+--------------+ FV Mid   Full                                                        +---------+---------------+---------+-----------+----------+--------------+ FV DistalFull                                                        +---------+---------------+---------+-----------+----------+--------------+ PFV      Full                                                        +---------+---------------+---------+-----------+----------+--------------+  POP      Full           Yes      Yes                                  +---------+---------------+---------+-----------+----------+--------------+ PTV      Full                                                        +---------+---------------+---------+-----------+----------+--------------+ PERO     Full                                                        +---------+---------------+---------+-----------+----------+--------------+   +---------+---------------+---------+-----------+----------+--------------+ LEFT     CompressibilityPhasicitySpontaneityPropertiesThrombus Aging +---------+---------------+---------+-----------+----------+--------------+ CFV      Full           Yes      Yes                                 +---------+---------------+---------+-----------+----------+--------------+ SFJ      Full                                                        +---------+---------------+---------+-----------+----------+--------------+ FV Prox  Full                                                        +---------+---------------+---------+-----------+----------+--------------+ FV Mid   Full                                                        +---------+---------------+---------+-----------+----------+--------------+ FV DistalFull                                                        +---------+---------------+---------+-----------+----------+--------------+ PFV      Full                                                        +---------+---------------+---------+-----------+----------+--------------+ POP      Full           Yes      Yes                                 +---------+---------------+---------+-----------+----------+--------------+  PTV      Full                                                        +---------+---------------+---------+-----------+----------+--------------+ PERO     Full                                                         +---------+---------------+---------+-----------+----------+--------------+     Summary: Right: There is no evidence of deep vein thrombosis in the lower extremity. Left: There is no evidence of deep vein thrombosis in the lower extremity.  *See table(s) above for measurements and observations. Electronically signed by Fabienne Brunsharles Fields MD on 07/25/2019 at 11:01:14 AM.    Final     Microbiology: Recent Results (from the past 240 hour(s))  SARS Coronavirus 2 [facility](Hospital order, Performed in [facility] lab) Nasopharyngeal Nasopharyngeal Swab     Status: None   Collection Time: 07/22/19 12:13 PM   Specimen: Nasopharyngeal Swab  Result Value Ref Range Status   SARS Coronavirus 2 NEGATIVE NEGATIVE Final    Comment: (NOTE) If result is NEGATIVE SARS-CoV-2 target nucleic acids are NOT DETECTED. The SARS-CoV-2 RNA is generally detectable in upper and lower  respiratory specimens during the acute phase of infection. The lowest  concentration of SARS-CoV-2 viral copies this assay can detect is 250  copies / mL. A negative result does not preclude SARS-CoV-2 infection  and should not be used as the sole basis for treatment or other  patient management decisions.  A negative result may occur with  improper specimen collection / handling, submission of specimen other  than nasopharyngeal swab, presence of viral mutation(s) within the  areas targeted by this assay, and inadequate number of viral copies  (<250 copies / mL). A negative result must be combined with clinical  observations, patient history, and epidemiological information. If result is POSITIVE SARS-CoV-2 target nucleic acids are DETECTED. The SARS-CoV-2 RNA is generally detectable in upper and lower  respiratory specimens dur ing the acute phase of infection.  Positive  results are indicative of active infection with SARS-CoV-2.  Clinical  correlation with patient history and other diagnostic information is  necessary to determine  patient infection status.  Positive results do  not rule out bacterial infection or co-infection with other viruses. If result is PRESUMPTIVE POSTIVE SARS-CoV-2 nucleic acids MAY BE PRESENT.   A presumptive positive result was obtained on the submitted specimen  and confirmed on repeat testing.  While 2019 novel coronavirus  (SARS-CoV-2) nucleic acids may be present in the submitted sample  additional confirmatory testing may be necessary for epidemiological  and / or clinical management purposes  to differentiate between  SARS-CoV-2 and other Sarbecovirus currently known to infect humans.  If clinically indicated additional testing with an alternate test  methodology [phone]([id number]) is advised. The SARS-CoV-2 RNA is generally  detectable in upper and lower respiratory sp ecimens during the acute  phase of infection. The expected result is Negative. Fact Sheet for Patients:  BoilerBrush.com.cyhttps://www.fda.gov/media/136312/download Fact Sheet for Healthcare Providers: https://pope.com/https://www.fda.gov/media/136313/download This test is not yet approved or cleared by the [place] FDA and has been authorized for detection  and/or diagnosis of SARS-CoV-2 by FDA under an Emergency Use Authorization (EUA).  This EUA will remain in effect (meaning this test can be used) for the duration of the COVID-19 declaration under Section 564(b)(1) of the Act, 21 U.S.C. section 360bbb-3(b)(1), unless the authorization is terminated or revoked sooner. Performed at [facility], [place] [address], [place], [place] [address]   MRSA PCR Screening     Status: None   Collection Time: 07/22/19  7:59 PM   Specimen: Nasal Mucosa; Nasopharyngeal  Result Value Ref Range Status   MRSA by PCR NEGATIVE NEGATIVE Final    Comment:        The GeneXpert MRSA Assay (FDA approved for NASAL specimens only), is one component of a comprehensive MRSA colonization surveillance program. It is not intended to diagnose MRSA infection nor to guide  or monitor treatment for MRSA infections. Performed at [facility], [place] [address], [place], [place] [address]      Labs: Basic Metabolic Panel: Recent Labs  Lab 07/24/19 0215 07/25/19 0208 07/25/19 1021 07/25/19 1941 07/26/19 0212 07/26/19 1457 07/26/19 1501 07/26/19 1517 07/26/19 1621 07/27/19 0209  NA 138 138  --  136 139 141 142 136  --  139  K 2.9* 3.4*  --  4.2 4.0 3.9 3.9 3.7  --  4.1  CL 98 98  --  100 101  --   --   --   --  105  CO2 29 28  --  25 27  --   --   --   --  26  GLUCOSE 101* 99  --  118* 107*  --   --   --   --  104*  BUN 14 16  --  22 20  --   --   --   --  17  CREATININE 0.97 0.97  --  1.44* 1.31*  --   --   --  0.78 0.80  CALCIUM 8.4* 8.7*  --  8.3* 8.7*  --   --   --   --  8.5*  MG  --   --  1.9 1.8 1.9  --   --   --   --  2.0  PHOS  --   --   --  2.9  --   --   --   --   --   --    Liver Function Tests: Recent Labs  Lab 07/22/19 1020  AST 18  ALT 13  ALKPHOS 66  BILITOT 0.7  PROT 6.2*  ALBUMIN 3.4*   No results for input(s): LIPASE, AMYLASE in the last 168 hours. No results for input(s): AMMONIA in the last 168 hours. CBC: Recent Labs  Lab 07/22/19 1020  07/23/19 0125 07/24/19 0215 07/25/19 0208 07/26/19 0212 07/26/19 1457 07/26/19 1501 07/26/19 1517 07/26/19 1621 07/27/19 0209  WBC 7.1  --  9.3 7.3 9.9 9.4  --   --   --  8.2 8.7  NEUTROABS 5.6  --  6.4 4.4 7.1  --   --   --   --   --   --   HGB 9.4*   < > 9.2* 9.2* 10.3* 10.2* 9.9* 10.2* 9.9* 10.1* 9.2*  HCT 29.4*   < > 30.2* 29.6* 32.1* 32.5* 29.0* 30.0* 29.0* 34.8* 29.4*  MCV 89.1  --  88.0 88.6 87.2 87.6  --   --   --  92.8 88.3  PLT 294  --  309 265  272 292  --   --   --  256 274   < > = values in this interval not displayed.   Cardiac Enzymes: No results for input(s): CKTOTAL, CKMB, CKMBINDEX, TROPONINI in the last 168 hours. BNP: BNP (last 3 results) Recent Labs    07/22/19 1021  BNP 723.7*    ProBNP (last 3 results) Recent Labs    01/12/19 1427   PROBNP 196.0*    CBG: No results for input(s): GLUCAP in the last 168 hours.     Signed:  Carolyne Littles, MD [facility] [phone] pager

## 2019-07-27 NOTE — Progress Notes (Signed)
   D/w Dr. Sherral Hammers - plan d/c home today based on favorable cath yesterday which did not show obstructive CAD and she is euvolemic to dry based on RHC. Agree with continuing to hold lasix - Entresto should handle volume needs. Unfortunately still hypoxic - may be COPD related, plan d/c home on oxygen.  [facility] will sign off.   Medication Recommendations:  Continue current dose Entresto, coreg and aldactone Other recommendations (labs, testing, etc):  none Follow up as an outpatient:  [name] or APP at [address].  Pixie Casino, MD, [facility], [place] Director of the [facility] &  Cardiovascular Risk Reduction Clinic Diplomate of the American Board of Clinical Lipidology Attending Cardiologist  Direct Dial: [phone]  Fax: [phone]  Website:  [email or web address]

## 2019-07-27 NOTE — TOC Progression Note (Addendum)
Transition of Care [facility]) - Progression Note    Patient Details  Name: Julie Rice MRN: [id number] Date of Birth: [birth year]  Transition of Care [facility]) CM/SW [place], [place] Phone Number: 07/27/2019, 11:39 AM  Clinical Narrative:     Patient set up with [employer] for home oxygen needs. O2 tank to be delivered to patient's room for discharge, patient's friend picking her up.   Patient set up with [facility] for Home health PT.  No further needs identified at this time.   Expected Discharge Plan: [place] Barriers to Discharge: Continued Medical Work up  Expected Discharge Plan and Services Expected Discharge Plan: [place] Choice: [place] arrangements for the past 2 months: Single Family Home Expected Discharge Date: 07/27/19                         HH Arranged: PT [place]: [facility] Date [place]: 07/26/19 Time [place]: 1017 Representative spoke with at [place]: [place] ([place]) Interventions    Readmission Risk Interventions No flowsheet data found.

## 2019-07-27 NOTE — Progress Notes (Signed)
Discharged home accompanied by sister-in law, discharge instructions and prescription given to pt. Belongings taken home.

## 2019-07-27 NOTE — Progress Notes (Addendum)
SATURATION QUALIFICATIONS: (This note is used to comply with regulatory documentation for home oxygen) Patient Saturations on Room Air at Rest = 98 %  Patient Saturations on Room Air while Ambulating =87  %  Patient Saturations on 2 Liters of oxygen while Ambulating = 95%  Please briefly explain why patient needs home oxygen: with shortness  breath on exertion.

## 2019-07-27 NOTE — Progress Notes (Signed)
Occupational Therapy Treatment Patient Details Name: Julie Rice MRN: [id number] DOB: 08/05/1936 Today's Date: 07/27/2019    History of present illness Pt is an 83 y.o. female admitted 07/22/19 with c/o several weeks of weakness, SOB and epigastric/chest pain. Worked up for new onset systolic HF. CT negative for PE. Plan for R/L heart cath on 9/21. PMH includes RA, HTN, esophageal stricture, COPD, bipolar disorder.   OT comments  Patient supine in bed, agreeable to OT.  Orthostatic BP assessed (see vitals flowsheet) but normal.  Pt limited by weakness, decreased activity tolerance.  Completing in room mobility with supervision, grooming at sink standing with supervision. Noted on RA during session with O2 maintained 93-100%, HR up to 103 with grooming at sink.  Visibly fatigued after minimal self care tasks. Educated on energy conservation techniques, safety, and ADL compensatory techniques. Agreeable to [place] in shower, reports having one available.  Continue to recommend 24/7 initial supervision. Will follow acutely.    Follow Up Recommendations  No OT follow up;Supervision/Assistance - 24 hour(initally)    Equipment Recommendations  None recommended by OT    Recommendations for Other Services      Precautions / Restrictions Precautions Precautions: Fall Precaution Comments: Orthostatic hypotension 9/19 and 9/21 Restrictions Weight Bearing Restrictions: No       Mobility Bed Mobility Overal bed mobility: Modified Independent             General bed mobility comments: HOB elevated, no assist    Transfers Overall transfer level: Needs assistance Equipment used: None Transfers: Sit to/from Stand Sit to Stand: Supervision         General transfer comment: for safety    Balance Overall balance assessment: Needs assistance Sitting-balance support: No upper extremity supported;Feet supported Sitting balance-Leahy Scale: Fair     Standing balance support: No upper  extremity supported;During functional activity Standing balance-Leahy Scale: Fair                             ADL either performed or assessed with clinical judgement   ADL Overall ADL's : Needs assistance/impaired     Grooming: Oral care;Supervision/safety;Standing                   Toilet Transfer: Supervision/safety;Ambulation           Functional mobility during ADLs: Supervision/safety General ADL Comments: pt educated on energy conservation techniques      Vision       Perception     Praxis      Cognition Arousal/Alertness: Awake/alert Behavior During Therapy: WFL for tasks assessed/performed Overall Cognitive Status: Within Functional Limits for tasks assessed                                          Exercises     Shoulder Instructions       General Comments see vitals flowsheet, O2 on RA during functional tasks 93-100%, HR up to 103 with grooming      Pertinent Vitals/ Pain       Pain Assessment: No/denies pain  Home Living                                          Prior Functioning/Environment  Frequency  Min 2X/week        Progress Toward Goals  OT Goals(current goals can now be found in the care plan section)  Progress towards OT goals: Progressing toward goals  Acute Rehab OT Goals Patient Stated Goal: to feel better  OT Goal Formulation: With patient  Plan Discharge plan remains appropriate;Frequency remains appropriate    Co-evaluation                 AM-PAC OT "6 Clicks" Daily Activity     Outcome Measure   Help from another person eating meals?: None Help from another person taking care of personal grooming?: A Little Help from another person toileting, which includes using toliet, bedpan, or urinal?: A Little Help from another person bathing (including washing, rinsing, drying)?: A Little Help from another person to put on and taking off regular  upper body clothing?: A Little Help from another person to put on and taking off regular lower body clothing?: A Little 6 Click Score: 19    End of Session    OT Visit Diagnosis: Dizziness and giddiness (R42)   Activity Tolerance Patient tolerated treatment well   Patient Left in chair;with call bell/phone within reach   Nurse Communication Mobility status        Time: 4782-9562 OT Time Calculation (min): 31 min  Charges: OT General Charges $OT Visit: 1 Visit OT Treatments $Self Care/Home Management : 23-37 mins  Delight Stare, OT Acute Rehabilitation Services Pager [phone] Office [phone]    Delight Stare 07/27/2019, 1:12 PM

## 2019-07-27 NOTE — Progress Notes (Signed)
All set for discharge home. Awaiting for the ride home. 

## 2019-07-28 ENCOUNTER — Other Ambulatory Visit: Payer: Self-pay | Admitting: Adult Health

## 2019-07-29 NOTE — Telephone Encounter (Signed)
Needs CPX 

## 2019-07-30 ENCOUNTER — Other Ambulatory Visit: Payer: Medicare Other

## 2019-07-30 ENCOUNTER — Encounter: Payer: Self-pay | Admitting: Family Medicine

## 2019-07-30 ENCOUNTER — Other Ambulatory Visit: Payer: Self-pay | Admitting: Adult Health

## 2019-07-30 NOTE — Telephone Encounter (Signed)
Sent to the pharmacy by e-scribe for 30 days.  Letter released to MyChart. 

## 2019-07-31 ENCOUNTER — Encounter: Payer: Self-pay | Admitting: Adult Health

## 2019-08-02 ENCOUNTER — Telehealth: Payer: Self-pay | Admitting: Adult Health

## 2019-08-02 NOTE — Telephone Encounter (Signed)
CC'd Chart message from [name]:  Message Received: Today Message Contents  Josue Hector, MD  Michaelyn Barter, RN; [name], Fonnie Mu, NP; Tanda Rockers, MD        Having issues with her home oxygen She has significant lung issues and BNP only in 200 range in hospital with normal right heart pressures Needs f/u with Tammy [name] and Dr Melvyn Novas as a lot of her dyspnea is likely from her lungs as well Post hospital dc      The office note with [name] mentions follow up with Dr Chase Caller.  Need to verify with patient if she is wanting to switch providers or if follow up is okay with MW or TP.  ATC patient to discuss the above and offer appt.  No answer, voicemail is full.  Will call back.

## 2019-08-03 ENCOUNTER — Telehealth: Payer: Self-pay | Admitting: Adult Health

## 2019-08-03 NOTE — Telephone Encounter (Signed)
Message sent by MyChart.  Will see if pt views and what she says about home health appointments.

## 2019-08-03 NOTE — Telephone Encounter (Signed)
Julie Rice, from [facility], called stating they have been trying to see pt for care and has not received answers at the door. She states she has reached out to pts emergency contacts with no luck. She states they will continue to try.    [phone]

## 2019-08-03 NOTE — Telephone Encounter (Signed)
DENIED.  FILLED ON 07/16/2019 FOR 90 DAYS.

## 2019-08-04 NOTE — Telephone Encounter (Signed)
Jailey Booton, I will try. I called & left a message. Don't know how much I can do, My chest has hurt today with heart burn. I am using the oxygen but can't see a lot of difference plus I keep hanging my feet in the tubbing . I can't motivate the canister. I use it at night & when I first get up while I am sitting on the bed. I have it by the bed. I was napping today & I DON'T answer unknown names or numbers. The hospital doctor had a different PT.  One through [facility]. Don't know if it is a different kind of therapy  Thanks for reaching out to me.  Message copied from [place] encounter.

## 2019-08-05 NOTE — Telephone Encounter (Signed)
I do not see where she is getting PT from anywhere

## 2019-08-05 NOTE — Telephone Encounter (Signed)
Order faxed to [facility] °

## 2019-08-07 ENCOUNTER — Other Ambulatory Visit: Payer: Self-pay

## 2019-08-07 ENCOUNTER — Inpatient Hospital Stay (HOSPITAL_COMMUNITY)
Admission: EM | Admit: 2019-08-07 | Discharge: 2019-08-13 | DRG: 871 | Disposition: A | Discharge status: Home Health | Payer: Medicare Other | Attending: Internal Medicine | Admitting: Internal Medicine

## 2019-08-07 ENCOUNTER — Emergency Department (HOSPITAL_COMMUNITY): Payer: Medicare Other

## 2019-08-07 ENCOUNTER — Encounter (HOSPITAL_COMMUNITY): Payer: Self-pay | Admitting: Emergency Medicine

## 2019-08-07 DIAGNOSIS — I2510 Atherosclerotic heart disease of native coronary artery without angina pectoris: Secondary | ICD-10-CM | POA: Diagnosis present

## 2019-08-07 DIAGNOSIS — K317 Polyp of stomach and duodenum: Secondary | ICD-10-CM | POA: Diagnosis not present

## 2019-08-07 DIAGNOSIS — R9431 Abnormal electrocardiogram [ECG] [EKG]: Secondary | ICD-10-CM | POA: Diagnosis present

## 2019-08-07 DIAGNOSIS — Z885 Allergy status to narcotic agent status: Secondary | ICD-10-CM

## 2019-08-07 DIAGNOSIS — J690 Pneumonitis due to inhalation of food and vomit: Secondary | ICD-10-CM | POA: Diagnosis not present

## 2019-08-07 DIAGNOSIS — G894 Chronic pain syndrome: Secondary | ICD-10-CM | POA: Diagnosis present

## 2019-08-07 DIAGNOSIS — M797 Fibromyalgia: Secondary | ICD-10-CM | POA: Diagnosis present

## 2019-08-07 DIAGNOSIS — J80 Acute respiratory distress syndrome: Secondary | ICD-10-CM | POA: Diagnosis not present

## 2019-08-07 DIAGNOSIS — K222 Esophageal obstruction: Secondary | ICD-10-CM | POA: Diagnosis not present

## 2019-08-07 DIAGNOSIS — I340 Nonrheumatic mitral (valve) insufficiency: Secondary | ICD-10-CM | POA: Diagnosis not present

## 2019-08-07 DIAGNOSIS — J9621 Acute and chronic respiratory failure with hypoxia: Secondary | ICD-10-CM | POA: Diagnosis not present

## 2019-08-07 DIAGNOSIS — K224 Dyskinesia of esophagus: Secondary | ICD-10-CM | POA: Diagnosis not present

## 2019-08-07 DIAGNOSIS — R1310 Dysphagia, unspecified: Secondary | ICD-10-CM | POA: Diagnosis not present

## 2019-08-07 DIAGNOSIS — R0602 Shortness of breath: Secondary | ICD-10-CM | POA: Diagnosis not present

## 2019-08-07 DIAGNOSIS — Z8249 Family history of ischemic heart disease and other diseases of the circulatory system: Secondary | ICD-10-CM

## 2019-08-07 DIAGNOSIS — A419 Sepsis, unspecified organism: Secondary | ICD-10-CM | POA: Diagnosis present

## 2019-08-07 DIAGNOSIS — I499 Cardiac arrhythmia, unspecified: Secondary | ICD-10-CM | POA: Diagnosis not present

## 2019-08-07 DIAGNOSIS — I5022 Chronic systolic (congestive) heart failure: Secondary | ICD-10-CM | POA: Diagnosis not present

## 2019-08-07 DIAGNOSIS — R079 Chest pain, unspecified: Secondary | ICD-10-CM | POA: Diagnosis present

## 2019-08-07 DIAGNOSIS — K228 Other specified diseases of esophagus: Secondary | ICD-10-CM | POA: Diagnosis not present

## 2019-08-07 DIAGNOSIS — F419 Anxiety disorder, unspecified: Secondary | ICD-10-CM | POA: Diagnosis present

## 2019-08-07 DIAGNOSIS — Z79899 Other long term (current) drug therapy: Secondary | ICD-10-CM

## 2019-08-07 DIAGNOSIS — J189 Pneumonia, unspecified organism: Secondary | ICD-10-CM

## 2019-08-07 DIAGNOSIS — Q396 Congenital diverticulum of esophagus: Secondary | ICD-10-CM

## 2019-08-07 DIAGNOSIS — E876 Hypokalemia: Secondary | ICD-10-CM | POA: Diagnosis not present

## 2019-08-07 DIAGNOSIS — Z20828 Contact with and (suspected) exposure to other viral communicable diseases: Secondary | ICD-10-CM | POA: Diagnosis present

## 2019-08-07 DIAGNOSIS — F319 Bipolar disorder, unspecified: Secondary | ICD-10-CM | POA: Diagnosis not present

## 2019-08-07 DIAGNOSIS — K449 Diaphragmatic hernia without obstruction or gangrene: Secondary | ICD-10-CM | POA: Diagnosis not present

## 2019-08-07 DIAGNOSIS — M069 Rheumatoid arthritis, unspecified: Secondary | ICD-10-CM | POA: Diagnosis present

## 2019-08-07 DIAGNOSIS — I4891 Unspecified atrial fibrillation: Secondary | ICD-10-CM | POA: Diagnosis not present

## 2019-08-07 DIAGNOSIS — I5023 Acute on chronic systolic (congestive) heart failure: Secondary | ICD-10-CM | POA: Diagnosis not present

## 2019-08-07 DIAGNOSIS — I248 Other forms of acute ischemic heart disease: Secondary | ICD-10-CM | POA: Diagnosis not present

## 2019-08-07 DIAGNOSIS — I110 Hypertensive heart disease with heart failure: Secondary | ICD-10-CM | POA: Diagnosis not present

## 2019-08-07 DIAGNOSIS — D649 Anemia, unspecified: Secondary | ICD-10-CM | POA: Diagnosis present

## 2019-08-07 DIAGNOSIS — I213 ST elevation (STEMI) myocardial infarction of unspecified site: Secondary | ICD-10-CM | POA: Diagnosis not present

## 2019-08-07 DIAGNOSIS — E785 Hyperlipidemia, unspecified: Secondary | ICD-10-CM | POA: Diagnosis present

## 2019-08-07 DIAGNOSIS — K219 Gastro-esophageal reflux disease without esophagitis: Secondary | ICD-10-CM | POA: Diagnosis present

## 2019-08-07 DIAGNOSIS — Z91048 Other nonmedicinal substance allergy status: Secondary | ICD-10-CM

## 2019-08-07 DIAGNOSIS — M810 Age-related osteoporosis without current pathological fracture: Secondary | ICD-10-CM | POA: Diagnosis present

## 2019-08-07 DIAGNOSIS — J449 Chronic obstructive pulmonary disease, unspecified: Secondary | ICD-10-CM | POA: Diagnosis present

## 2019-08-07 DIAGNOSIS — Z9981 Dependence on supplemental oxygen: Secondary | ICD-10-CM

## 2019-08-07 DIAGNOSIS — K220 Achalasia of cardia: Secondary | ICD-10-CM

## 2019-08-07 DIAGNOSIS — Z791 Long term (current) use of non-steroidal anti-inflammatories (NSAID): Secondary | ICD-10-CM

## 2019-08-07 DIAGNOSIS — Z7982 Long term (current) use of aspirin: Secondary | ICD-10-CM

## 2019-08-07 DIAGNOSIS — I509 Heart failure, unspecified: Secondary | ICD-10-CM | POA: Diagnosis not present

## 2019-08-07 DIAGNOSIS — Z8701 Personal history of pneumonia (recurrent): Secondary | ICD-10-CM

## 2019-08-07 DIAGNOSIS — Z8673 Personal history of transient ischemic attack (TIA), and cerebral infarction without residual deficits: Secondary | ICD-10-CM

## 2019-08-07 DIAGNOSIS — I447 Left bundle-branch block, unspecified: Secondary | ICD-10-CM | POA: Diagnosis not present

## 2019-08-07 DIAGNOSIS — R778 Other specified abnormalities of plasma proteins: Secondary | ICD-10-CM | POA: Diagnosis not present

## 2019-08-07 DIAGNOSIS — I10 Essential (primary) hypertension: Secondary | ICD-10-CM | POA: Diagnosis not present

## 2019-08-07 LAB — BASIC METABOLIC PANEL
Anion gap: 11 (ref 5–15)
BUN: 14 mg/dL (ref 8–23)
CO2: 27 mmol/L (ref 22–32)
Calcium: 9.3 mg/dL (ref 8.9–10.3)
Chloride: 103 mmol/L (ref 98–111)
Creatinine, Ser: 0.89 mg/dL (ref 0.44–1.00)
GFR calc Af Amer: >60 mL/min (ref >60)
GFR calc non Af Amer: >60 mL/min (ref >60)
Glucose, Bld: 158 mg/dL — ABNORMAL HIGH (ref 70–99)
Potassium: 3.5 mmol/L (ref 3.5–5.1)
Sodium: 141 mmol/L (ref 135–145)

## 2019-08-07 LAB — CBC WITH DIFFERENTIAL/PLATELET
Abs Immature Granulocytes: 0.02 10*3/uL (ref 0.00–0.07)
Basophils Absolute: 0 10*3/uL (ref 0.0–0.1)
Basophils Relative: 0 %
Eosinophils Absolute: 0.2 10*3/uL (ref 0.0–0.5)
Eosinophils Relative: 1 %
HCT: 33.3 % — ABNORMAL LOW (ref 36.0–46.0)
Hemoglobin: 9.8 g/dL — ABNORMAL LOW (ref 12.0–15.0)
Immature Granulocytes: 0 %
Lymphocytes Relative: 5 %
Lymphs Abs: 0.5 10*3/uL — ABNORMAL LOW (ref 0.7–4.0)
MCH: 27.8 pg (ref 26.0–34.0)
MCHC: 29.4 g/dL — ABNORMAL LOW (ref 30.0–36.0)
MCV: 94.3 fL (ref 80.0–100.0)
Monocytes Absolute: 0.2 10*3/uL (ref 0.1–1.0)
Monocytes Relative: 2 %
Neutro Abs: 9.6 10*3/uL — ABNORMAL HIGH (ref 1.7–7.7)
Neutrophils Relative %: 92 %
Platelets: 244 10*3/uL (ref 150–400)
RBC: 3.53 MIL/uL — ABNORMAL LOW (ref 3.87–5.11)
RDW: 15.8 % — ABNORMAL HIGH (ref 11.5–15.5)
WBC: 10.5 10*3/uL (ref 4.0–10.5)
nRBC: 0 % (ref 0.0–0.2)

## 2019-08-07 LAB — URINALYSIS, ROUTINE W REFLEX MICROSCOPIC
Bacteria, UA: NONE SEEN
Bilirubin Urine: NEGATIVE
Glucose, UA: NEGATIVE mg/dL
Hgb urine dipstick: NEGATIVE
Ketones, ur: 5 mg/dL — AB
Nitrite: NEGATIVE
Protein, ur: 30 mg/dL — AB
Specific Gravity, Urine: 1.019 (ref 1.005–1.030)
Trans Epithel, UA: 1
pH: 5 (ref 5.0–8.0)

## 2019-08-07 LAB — BRAIN NATRIURETIC PEPTIDE: B Natriuretic Peptide: 748.8 pg/mL — ABNORMAL HIGH (ref 0.0–100.0)

## 2019-08-07 LAB — POCT I-STAT 7, (LYTES, BLD GAS, ICA,H+H)
Acid-Base Excess: 1 mmol/L (ref 0.0–2.0)
Bicarbonate: 26.2 mmol/L (ref 20.0–28.0)
Calcium, Ion: 1.25 mmol/L (ref 1.15–1.40)
HCT: 26 % — ABNORMAL LOW (ref 36.0–46.0)
Hemoglobin: 8.8 g/dL — ABNORMAL LOW (ref 12.0–15.0)
O2 Saturation: 93 %
Patient temperature: 97.4
Potassium: 3.1 mmol/L — ABNORMAL LOW (ref 3.5–5.1)
Sodium: 142 mmol/L (ref 135–145)
TCO2: 27 mmol/L (ref 22–32)
pCO2 arterial: 39.8 mmHg (ref 32.0–48.0)
pH, Arterial: 7.423 (ref 7.350–7.450)
pO2, Arterial: 65 mmHg — ABNORMAL LOW (ref 83.0–108.0)

## 2019-08-07 LAB — SARS CORONAVIRUS 2 BY RT PCR (HOSPITAL ORDER, PERFORMED IN ~~LOC~~ HOSPITAL LAB): SARS Coronavirus 2: NEGATIVE

## 2019-08-07 LAB — LACTIC ACID, PLASMA
Lactic Acid, Venous: 3.3 mmol/L (ref 0.5–1.9)
Lactic Acid, Venous: 0.9 mmol/L (ref 0.5–1.9)
Lactic Acid, Venous: 2 mmol/L (ref 0.5–1.9)

## 2019-08-07 LAB — HEMOGLOBIN AND HEMATOCRIT, BLOOD
HCT: 29.4 % — ABNORMAL LOW (ref 36.0–46.0)
Hemoglobin: 9.3 g/dL — ABNORMAL LOW (ref 12.0–15.0)

## 2019-08-07 LAB — TYPE AND SCREEN
ABO/RH(D): AB POS
Antibody Screen: NEGATIVE

## 2019-08-07 LAB — PROCALCITONIN: Procalcitonin: 2.8 ng/mL

## 2019-08-07 LAB — TROPONIN I (HIGH SENSITIVITY)
Troponin I (High Sensitivity): 18 ng/L — ABNORMAL HIGH (ref <18)
Troponin I (High Sensitivity): 76 ng/L — ABNORMAL HIGH (ref <18)

## 2019-08-07 MED ORDER — CLONAZEPAM 0.5 MG PO TABS
0.5000 mg | ORAL_TABLET | Freq: Two times a day (BID) | ORAL | Status: DC | PRN
  Administered 2019-08-07 – 2019-08-12 (×5): 0.5 mg via ORAL
  Filled 2019-08-07 (×5): qty 1

## 2019-08-07 MED ORDER — SPIRONOLACTONE 12.5 MG HALF TABLET
12.5000 mg | ORAL_TABLET | Freq: Every day | ORAL | Status: DC
  Administered 2019-08-07 – 2019-08-13 (×6): 12.5 mg via ORAL
  Filled 2019-08-07 (×6): qty 1

## 2019-08-07 MED ORDER — GABAPENTIN 100 MG PO CAPS
100.0000 mg | ORAL_CAPSULE | Freq: Three times a day (TID) | ORAL | Status: DC
  Administered 2019-08-07 – 2019-08-13 (×17): 100 mg via ORAL
  Filled 2019-08-07 (×17): qty 1

## 2019-08-07 MED ORDER — ACETAMINOPHEN 325 MG PO TABS
650.0000 mg | ORAL_TABLET | Freq: Four times a day (QID) | ORAL | Status: DC | PRN
  Filled 2019-08-07: qty 2

## 2019-08-07 MED ORDER — AMLODIPINE BESYLATE 5 MG PO TABS
2.5000 mg | ORAL_TABLET | Freq: Every day | ORAL | Status: DC
  Administered 2019-08-07 – 2019-08-13 (×6): 2.5 mg via ORAL
  Filled 2019-08-07 (×6): qty 1

## 2019-08-07 MED ORDER — FUROSEMIDE 10 MG/ML IJ SOLN
20.0000 mg | INTRAMUSCULAR | Status: AC
  Administered 2019-08-07: 20 mg via INTRAVENOUS
  Filled 2019-08-07: qty 4

## 2019-08-07 MED ORDER — CARVEDILOL 3.125 MG PO TABS
3.1250 mg | ORAL_TABLET | Freq: Two times a day (BID) | ORAL | Status: DC
  Administered 2019-08-08 – 2019-08-13 (×11): 3.125 mg via ORAL
  Filled 2019-08-07 (×13): qty 1

## 2019-08-07 MED ORDER — ENOXAPARIN SODIUM 40 MG/0.4ML ~~LOC~~ SOLN
40.0000 mg | SUBCUTANEOUS | Status: DC
  Administered 2019-08-07: 40 mg via SUBCUTANEOUS
  Filled 2019-08-07 (×2): qty 0.4

## 2019-08-07 MED ORDER — ONDANSETRON HCL 4 MG PO TABS: 4.0000 mg | ORAL_TABLET | Freq: Four times a day (QID) | ORAL | Status: DC | PRN

## 2019-08-07 MED ORDER — ACETAMINOPHEN 650 MG RE SUPP: 650.0000 mg | Freq: Four times a day (QID) | RECTAL | Status: DC | PRN

## 2019-08-07 MED ORDER — SODIUM CHLORIDE 0.9 % IV SOLN: 2.0000 g | INTRAVENOUS | Status: DC

## 2019-08-07 MED ORDER — SODIUM CHLORIDE 0.9 % IV BOLUS
1000.0000 mL | Freq: Once | INTRAVENOUS | Status: AC
  Administered 2019-08-07: 1000 mL via INTRAVENOUS

## 2019-08-07 MED ORDER — SERTRALINE HCL 100 MG PO TABS
100.0000 mg | ORAL_TABLET | Freq: Every day | ORAL | Status: DC
  Administered 2019-08-07 – 2019-08-12 (×6): 100 mg via ORAL
  Filled 2019-08-07 (×7): qty 1

## 2019-08-07 MED ORDER — NAPHAZOLINE-GLYCERIN 0.012-0.2 % OP SOLN
1.0000 [drp] | Freq: Four times a day (QID) | OPHTHALMIC | Status: DC | PRN
  Filled 2019-08-07: qty 15

## 2019-08-07 MED ORDER — FUROSEMIDE 10 MG/ML IJ SOLN
20.0000 mg | Freq: Once | INTRAMUSCULAR | Status: AC
  Administered 2019-08-07: 20 mg via INTRAVENOUS
  Filled 2019-08-07: qty 4

## 2019-08-07 MED ORDER — NITROGLYCERIN 2 % TD OINT
1.0000 [in_us] | TOPICAL_OINTMENT | Freq: Four times a day (QID) | TRANSDERMAL | Status: DC
  Administered 2019-08-07 (×2): 1 [in_us] via TOPICAL
  Filled 2019-08-07 (×2): qty 1

## 2019-08-07 MED ORDER — SUCRALFATE 1 G PO TABS
1.0000 g | ORAL_TABLET | Freq: Four times a day (QID) | ORAL | Status: DC | PRN
  Administered 2019-08-07 – 2019-08-11 (×8): 1 g via ORAL
  Filled 2019-08-07 (×8): qty 1

## 2019-08-07 MED ORDER — ASPIRIN EC 81 MG PO TBEC
81.0000 mg | DELAYED_RELEASE_TABLET | Freq: Every day | ORAL | Status: DC
  Administered 2019-08-07 – 2019-08-13 (×6): 81 mg via ORAL
  Filled 2019-08-07 (×6): qty 1

## 2019-08-07 MED ORDER — SODIUM CHLORIDE 0.9 % IV SOLN
2.0000 g | INTRAVENOUS | Status: DC
  Administered 2019-08-07 – 2019-08-08 (×2): 2 g via INTRAVENOUS
  Filled 2019-08-07: qty 20
  Filled 2019-08-07: qty 2

## 2019-08-07 MED ORDER — GUAIFENESIN ER 600 MG PO TB12
600.0000 mg | ORAL_TABLET | Freq: Two times a day (BID) | ORAL | Status: DC
  Administered 2019-08-07 – 2019-08-13 (×13): 600 mg via ORAL
  Filled 2019-08-07 (×12): qty 1

## 2019-08-07 MED ORDER — ACETAMINOPHEN 325 MG PO TABS: 650.0000 mg | ORAL_TABLET | Freq: Four times a day (QID) | ORAL | Status: DC | PRN

## 2019-08-07 MED ORDER — MAGNESIUM SULFATE 2 GM/50ML IV SOLN: 2.0000 g | Freq: Once | INTRAVENOUS | Status: DC

## 2019-08-07 MED ORDER — PANTOPRAZOLE SODIUM 40 MG PO TBEC
40.0000 mg | DELAYED_RELEASE_TABLET | Freq: Two times a day (BID) | ORAL | Status: DC
  Administered 2019-08-07: 40 mg via ORAL
  Filled 2019-08-07: qty 1

## 2019-08-07 MED ORDER — MAGNESIUM SULFATE 2 GM/50ML IV SOLN
2.0000 g | INTRAVENOUS | Status: AC
  Administered 2019-08-07: 2 g via INTRAVENOUS
  Filled 2019-08-07: qty 50

## 2019-08-07 MED ORDER — ONDANSETRON HCL 4 MG/2ML IJ SOLN
4.0000 mg | Freq: Four times a day (QID) | INTRAMUSCULAR | Status: DC | PRN
  Administered 2019-08-07: 4 mg via INTRAVENOUS
  Filled 2019-08-07: qty 2

## 2019-08-07 MED ORDER — SODIUM CHLORIDE 0.9 % IV SOLN
500.0000 mg | INTRAVENOUS | Status: DC
  Administered 2019-08-07: 500 mg via INTRAVENOUS
  Filled 2019-08-07: qty 500

## 2019-08-07 MED ORDER — SODIUM CHLORIDE 0.9% FLUSH
3.0000 mL | Freq: Two times a day (BID) | INTRAVENOUS | Status: DC
  Administered 2019-08-07 – 2019-08-13 (×7): 3 mL via INTRAVENOUS

## 2019-08-07 MED ORDER — FENTANYL CITRATE (PF) 100 MCG/2ML IJ SOLN
50.0000 ug | Freq: Once | INTRAMUSCULAR | Status: AC
  Administered 2019-08-07: 50 ug via INTRAVENOUS
  Filled 2019-08-07: qty 2

## 2019-08-07 MED ORDER — POTASSIUM CHLORIDE 10 MEQ/100ML IV SOLN
10.0000 meq | INTRAVENOUS | Status: AC
  Administered 2019-08-07 (×2): 10 meq via INTRAVENOUS
  Filled 2019-08-07 (×2): qty 100

## 2019-08-07 MED ORDER — SODIUM CHLORIDE 0.9 % IV SOLN: 500.0000 mg | INTRAVENOUS | Status: DC

## 2019-08-07 MED ORDER — SODIUM CHLORIDE 0.9 % IV SOLN
100.0000 mg | Freq: Two times a day (BID) | INTRAVENOUS | Status: DC
  Administered 2019-08-07 – 2019-08-10 (×6): 100 mg via INTRAVENOUS
  Filled 2019-08-07 (×10): qty 100

## 2019-08-07 MED ORDER — POTASSIUM CHLORIDE CRYS ER 20 MEQ PO TBCR
40.0000 meq | EXTENDED_RELEASE_TABLET | ORAL | Status: AC
  Administered 2019-08-07: 40 meq via ORAL
  Filled 2019-08-07: qty 2

## 2019-08-07 MED ORDER — SACUBITRIL-VALSARTAN 24-26 MG PO TABS
1.0000 | ORAL_TABLET | Freq: Two times a day (BID) | ORAL | Status: DC
  Administered 2019-08-07 – 2019-08-13 (×12): 1 via ORAL
  Filled 2019-08-07 (×16): qty 1

## 2019-08-07 MED ORDER — FENTANYL CITRATE (PF) 100 MCG/2ML IJ SOLN
25.0000 ug | INTRAMUSCULAR | Status: DC | PRN
  Administered 2019-08-07 – 2019-08-11 (×3): 25 ug via INTRAVENOUS
  Filled 2019-08-07 (×3): qty 2

## 2019-08-07 MED ORDER — ALBUTEROL SULFATE (2.5 MG/3ML) 0.083% IN NEBU: 2.5000 mg | INHALATION_SOLUTION | Freq: Four times a day (QID) | RESPIRATORY_TRACT | Status: DC | PRN

## 2019-08-07 MED ORDER — LORAZEPAM 2 MG/ML IJ SOLN
0.2500 mg | Freq: Four times a day (QID) | INTRAMUSCULAR | Status: DC | PRN
  Administered 2019-08-07 – 2019-08-12 (×13): 0.25 mg via INTRAVENOUS
  Filled 2019-08-07 (×13): qty 1

## 2019-08-07 NOTE — Progress Notes (Signed)
Msg sent to Dr.Blount. Critical Value. Pt's Lactic Acid now 2 was 0.9 also pt's status side bar is [place] for possible Covid and she's on the non Covid side.

## 2019-08-07 NOTE — ED Notes (Signed)
RT and EDP at bedside 

## 2019-08-07 NOTE — ED Provider Notes (Addendum)
[facility] EMERGENCY DEPARTMENT Provider Note   CSN: [id number] Arrival date & time: 08/07/19  0606     History   Chief Complaint Chief Complaint  Patient presents with   Shortness of Breath   Chest Pain    HPI Julie Rice is a 83 y.o. female.     Patient presents to the emergency department from home with chest pain and shortness of breath.  Patient reports that she was awakened from sleep approximately 2 hours before she called EMS with left-sided chest pain.  Patient noted shortness of breath with the chest pain.  Symptoms progressively worsened, she was unable to get out of bed to call for help.  She ultimately was able to call 911 and EMS had to break down the door to get inside, found her severely dyspneic and hypoxic, oxygen saturation 70% on 3 L nasal cannula which is her continuous home O2.  Patient placed on CPAP with improvement.  Patient was given 1 sublingual nitroglycerin with minimal improvement of her chest pain.      Past Medical History:  Diagnosis Date   Anemia    Anxiety    Bipolar disorder (HCC)    Blood transfusion 1991   autologous pts own blood given    CAD (coronary artery disease)    Cataract    Chest pain    "@ rest, lying down, w/exertion"   Chronic back pain    "mostly lower back but I do have upper back pain regularly" (04/06/2018)   COPD (chronic obstructive pulmonary disease) (HCC)    Depression    Esophageal stricture    Fibromyalgia    GERD (gastroesophageal reflux disease)    Heart murmur    "slight" (04/06/2018)   Hiatal hernia    Hyperlipidemia    Patient denies   Hypertension    Internal hemorrhoids    Lumbago    Mitral regurgitation    Osteoarthritis    Osteoporosis    Pneumonia ~ 01/2018   PONV (postoperative nausea and vomiting)    severe ponv, "in the past" (04/06/2018)   Rectal bleeding    Rheumatoid arthritis (HCC)    Spondylosis    TIA (transient ischemic attack)  2013   Urinary incontinence    wears depends    Patient Active Problem List   Diagnosis Date Noted   CHF (congestive heart failure) (HCC) 07/24/2019   Chest pain of uncertain etiology    SOB (shortness of breath)    Asthma 04/22/2019   Abnormal CT of the chest 02/23/2019   Esophageal motility disorder    Esophageal dysmotility    Loss of weight    Moderate protein-calorie malnutrition (HCC)    Hematemesis 04/06/2018   Periesophageal hiatal hernia 08/02/2016   Dysphagia 05/10/2016   Esophageal stricture 05/10/2016   Epigastric pain 05/10/2016   Generalized anxiety disorder 02/06/2012   Major depressive disorder, recurrent (HCC) 02/05/2012   Hypokalemia 11/09/2011   Nausea and vomiting in adult 11/08/2011   Dehydration 11/08/2011   Tremors of nervous system 11/08/2011   Hypothyroidism 05/10/2008   Dyslipidemia 05/10/2008   UNSPECIFIED ANEMIA 05/10/2008   CAROTID ARTERY DISEASE 05/10/2008   Transient cerebral ischemia 05/10/2008   Chronic back pain 05/10/2008   OSTEOPENIA 05/10/2008   Dyspnea 05/10/2008   ESOPHAGEAL STRICTURE 01/19/2008   Essential hypertension 04/29/2007   GERD 04/29/2007   Osteoarthritis 04/29/2007   SPONDYLOSIS 04/29/2007    Past Surgical History:  Procedure Laterality Date   ABDOMINAL HYSTERECTOMY  1982  BACK SURGERY     BALLOON DILATION N/A 09/21/2018   Procedure: BALLOON DILATION;  Surgeon: Hilarie Fredrickson, MD;  Location: [facility];  Service: Endoscopy;  Laterality: N/A;   BLADDER SUSPENSION  1980's   BOTOX INJECTION N/A 09/21/2018   Procedure: BOTOX INJECTION;  Surgeon: Hilarie Fredrickson, MD;  Location: [facility] ENDOSCOPY;  Service: Endoscopy;  Laterality: N/A;   CATARACT EXTRACTION W/ INTRAOCULAR LENS  IMPLANT, BILATERAL Bilateral 2010   DILATION AND CURETTAGE OF UTERUS  1961   ESOPHAGEAL MANOMETRY N/A 03/25/2018   Procedure: ESOPHAGEAL MANOMETRY (EM);  Surgeon: Napoleon Form, MD;  Location: [facility]  ENDOSCOPY;  Service: Endoscopy;  Laterality: N/A;   ESOPHAGOGASTRODUODENOSCOPY (EGD) WITH PROPOFOL N/A 04/07/2018   Procedure: ESOPHAGOGASTRODUODENOSCOPY (EGD) WITH PROPOFOL;  Surgeon: Sherrilyn Rist, MD;  Location: [facility] ENDOSCOPY;  Service: Gastroenterology;  Laterality: N/A;   ESOPHAGOGASTRODUODENOSCOPY (EGD) WITH PROPOFOL N/A 09/21/2018   Procedure: ESOPHAGOGASTRODUODENOSCOPY (EGD) WITH PROPOFOL;  Surgeon: Hilarie Fredrickson, MD;  Location: [facility] ENDOSCOPY;  Service: Endoscopy;  Laterality: N/A;   HERNIA REPAIR     HIATAL HERNIA REPAIR N/A 08/02/2016   Procedure: LAPAROSCOPIC REPAIR OF LARGE  HIATAL HERNIA;  Surgeon: Glenna Fellows, MD;  Location: [facility]S;  Service: General;  Laterality: N/A;   JOINT REPLACEMENT     LAPAROSCOPIC NISSEN FUNDOPLICATION N/A 08/02/2016   Procedure: LAPAROSCOPIC NISSEN FUNDOPLICATION;  Surgeon: Glenna Fellows, MD;  Location: [facility];  Service: General;  Laterality: N/A;   LUMBAR LAMINECTOMY  1990; 1994; [id number]   "I've got 2 stainless steel rods; 6 screws; 2 ray cages"took bone from right hip to put in back   RIGHT/LEFT HEART CATH AND CORONARY ANGIOGRAPHY N/A 07/26/2019   Procedure: RIGHT/LEFT HEART CATH AND CORONARY ANGIOGRAPHY;  Surgeon: Lyn Records, MD;  Location: [facility] IN[facility]AS[facility];  Service: Cardiovascular;  Laterality: N/A;   TONSILLECTOMY AND ADENOIDECTOMY  1945   TOTAL KNEE ARTHROPLASTY Right ~ 1996   TUBAL LIGATION  ~ 1976     OB History   No obstetric history on file.      Home Medications    Prior to Admission medications   Medication Sig Start Date End Date Taking? Authorizing Provider  acetaminophen (TYLENOL) 325 MG tablet Take 2 tablets (650 mg total) by mouth every 4 (four) hours as needed for headache or mild pain. 07/27/19   Drema Dallas, MD  albuterol (VENTOLIN HFA) 108 (90 Base) MCG/ACT inhaler Inhale 2 puffs into the lungs every 6 (six) hours as needed for wheezing or shortness of breath. 02/23/19   [name], Tammy S, NP    amLODipine (NORVASC) 2.5 MG tablet Take 2.5 mg by mouth daily.    [provider]  aspirin EC 81 MG EC tablet Take 1 tablet (81 mg total) by mouth daily. 07/28/19   Drema Dallas, MD  carvedilol (COREG) 3.125 MG tablet TAKE 1 TABLET BY MOUTH TWICE A DAY WITH A MEAL 07/30/19   Nafziger, [name], NP  clonazePAM (KLONOPIN) 0.5 MG tablet TAKE 1 TABLET BY MOUTH TWICE A DAY AS NEEDED FOR ANXIETY 05/12/19   Nafziger, [name], NP  ferrous sulfate 325 (65 FE) MG tablet Take 1 tablet (325 mg total) by mouth daily with breakfast. 07/28/19   Drema Dallas, MD  gabapentin (NEURONTIN) 100 MG capsule TAKE 1 CAPSULE BY MOUTH THREE TIMES A DAY 07/13/19   Nafziger, [name], NP  meloxicam (MOBIC) 15 MG tablet Take 1 tablet (15 mg total) by mouth daily. 11/05/18   Shirline Frees, NP  Multiple Vitamin (MULTIVITAMIN) tablet Take 1 tablet by mouth daily. Vitamin supplement. 02/07/12   Viviann Spare, FNP  pantoprazole (PROTONIX) 40 MG tablet Take 1 tablet (40 mg total) by mouth 2 (two) times daily. 08/31/18   Tressia Danas, MD  sacubitril-valsartan (ENTRESTO) 24-26 MG Take 1 tablet by mouth 2 (two) times daily. 07/27/19   Drema Dallas, MD  sertraline (ZOLOFT) 100 MG tablet TAKE 1 TABLET BY MOUTH EVERYDAY AT BEDTIME 07/16/19   Nafziger, [name], NP  spironolactone (ALDACTONE) 25 MG tablet Take 0.5 tablets (12.5 mg total) by mouth daily. 07/28/19   Drema Dallas, MD  sucralfate (CARAFATE) 1 g tablet Take 1 tablet (1 g total) by mouth 4 (four) times daily. Dissolve tablet in 10 ml warm water Patient taking differently: Take 1 g by mouth 4 (four) times daily as needed (heartburn). Dissolve tablet in 10 ml warm water 08/31/18   Tressia Danas, MD  tetrahydrozoline 0.05 % ophthalmic solution Place 1 drop into both eyes daily.    [provider]    Family History Family History  Problem Relation Age of Onset   Kidney disease Mother    Hypertension Mother    Heart disease Father 20       die of MI at age 16    Suicidality Son    Heart disease Brother    Heart attack Brother    Colon cancer Neg Hx    Esophageal cancer Neg Hx    Pancreatic cancer Neg Hx    Rectal cancer Neg Hx    Stomach cancer Neg Hx     Social History Social History   Tobacco Use   Smoking status: Never Smoker   Smokeless tobacco: Never Used  Substance Use Topics   Alcohol use: Never    Frequency: Never   Drug use: Never     Allergies   Morphine and Tape   Review of Systems Review of Systems  Respiratory: Positive for shortness of breath.   Cardiovascular: Positive for chest pain.  All other systems reviewed and are negative.    Physical Exam Updated Vital Signs BP (!) 117/58    Pulse 93    Temp (!) 97.2 F (36.2 C) (Oral)    Resp 19    Ht 5\' 3"  (1.6 m)    Wt 63.7 kg    SpO2 100%    BMI 24.88 kg/m   Physical Exam Vitals signs and nursing note reviewed.  Constitutional:      General: She is in acute distress.     Appearance: Normal appearance. She is well-developed.  HENT:     Head: Normocephalic and atraumatic.     Right Ear: Hearing normal.     Left Ear: Hearing normal.     Nose: Nose normal.  Eyes:     Conjunctiva/sclera: Conjunctivae normal.     Pupils: Pupils are equal, round, and reactive to light.  Neck:     Musculoskeletal: Normal range of motion and neck supple.  Cardiovascular:     Rate and Rhythm: Regular rhythm. Tachycardia present.     Heart sounds: S1 normal and S2 normal. No murmur. No friction rub. No gallop.   Pulmonary:     Effort: Pulmonary effort is normal. No respiratory distress.     Breath sounds: Decreased breath sounds and rales present.  Chest:     Chest wall: No tenderness.  Abdominal:     General: Bowel sounds are normal.     Palpations: Abdomen is soft.  Tenderness: There is no abdominal tenderness. There is no guarding or rebound. Negative signs include Murphy's sign and McBurney's sign.     Hernia: No hernia is present.  Musculoskeletal:  Normal range of motion.  Skin:    General: Skin is warm and dry.     Findings: No rash.  Neurological:     Mental Status: She is alert and oriented to person, place, and time.     GCS: GCS eye subscore is 4. GCS verbal subscore is 5. GCS motor subscore is 6.     Cranial Nerves: No cranial nerve deficit.     Sensory: No sensory deficit.     Coordination: Coordination normal.  Psychiatric:        Speech: Speech normal.        Behavior: Behavior normal.        Thought Content: Thought content normal.      ED Treatments / Results  Labs (all labs ordered are listed, but only abnormal results are displayed) Labs Reviewed  CBC WITH DIFFERENTIAL/PLATELET - Abnormal; Notable for the following components:      Result Value   RBC 3.53 (*)    Hemoglobin 9.8 (*)    HCT 33.3 (*)    MCHC 29.4 (*)    RDW 15.8 (*)    Neutro Abs 9.6 (*)    Lymphs Abs 0.5 (*)    All other components within normal limits  BASIC METABOLIC PANEL - Abnormal; Notable for the following components:   Glucose, Bld 158 (*)    All other components within normal limits  BRAIN NATRIURETIC PEPTIDE - Abnormal; Notable for the following components:   B Natriuretic Peptide 748.8 (*)    All other components within normal limits  LACTIC ACID, PLASMA - Abnormal; Notable for the following components:   Lactic Acid, Venous 3.3 (*)    All other components within normal limits  TROPONIN I (HIGH SENSITIVITY) - Abnormal; Notable for the following components:   Troponin I (High Sensitivity) 18 (*)    All other components within normal limits  SARS CORONAVIRUS 2 (HOSPITAL ORDER, PERFORMED IN [facility])  CULTURE, BLOOD (ROUTINE X 2)  CULTURE, BLOOD (ROUTINE X 2)  URINE CULTURE  URINALYSIS, ROUTINE W REFLEX MICROSCOPIC  TROPONIN I (HIGH SENSITIVITY)    EKG EKG Interpretation  Date/Time:  Saturday August 07 2019 06:13:12 EDT Ventricular Rate:  109 PR Interval:    QRS Duration: 146 QT Interval:  387 QTC  Calculation: 522 R Axis:   -73 Text Interpretation:  Sinus tachycardia Left bundle branch block Confirmed by Gilda CreasePollina, Gery Sabedra J [phone]([id number]) on 08/07/2019 7:57:16 AM   Radiology Dg Chest Portable 1 View  Result Date: 08/07/2019 CLINICAL DATA:  Recurrent chest pain, shortness of breath EXAM: PORTABLE CHEST 1 VIEW COMPARISON:  07/22/2019 FINDINGS: Extensive left upper lobe and left lower lobe airspace disease. Mild right lower lobe airspace disease. No right pleural effusion. Possible trace left pleural effusion. No pneumothorax. Stable cardiomegaly. No acute osseous abnormality. Moderate osteoarthritis of the left glenohumeral joint. IMPRESSION: Extensive airspace disease in the left upper lobe and left lower lobe and to a much lesser extent right lower lobe most concerning for multilobar pneumonia. Electronically Signed   By: Elige KoHetal  Patel   On: 08/07/2019 06:26    Procedures Procedures (including critical care time)  Medications Ordered in ED Medications  nitroGLYCERIN (NITROGLYN) 2 % ointment 1 inch (1 inch Topical Given 08/07/19 0620)  cefTRIAXone (ROCEPHIN) 2 g in sodium chloride 0.9 %  100 mL IVPB (2 g Intravenous New Bag/Given 08/07/19 0749)  azithromycin (ZITHROMAX) 500 mg in sodium chloride 0.9 % 250 mL IVPB (500 mg Intravenous New Bag/Given 08/07/19 0748)  sodium chloride 0.9 % bolus 1,000 mL (has no administration in time range)     Initial Impression / Assessment and Plan / ED Course  I have reviewed the triage vital signs and the nursing notes.  Pertinent labs & imaging results that were available during my care of the patient were reviewed by me and considered in my medical decision making (see chart for details).        Patient presents to the emergency department for evaluation of shortness of breath.  Patient brought to the ER from home on CPAP.  EMS reported significant hypoxia on her normal nasal cannula oxygen.  She improved on CPAP.  Patient experiencing chest pain as  well.  Records reviewed, she did undergo cardiac catheterization during her hospitalization 2 weeks ago and did not have any significant coronary artery disease.  She does have a history of congestive heart failure.  Based on her dyspnea and significant rales on examination, volume overload was considered.  Portable chest x-ray, however, shows significant opacity in the left upper and lower lobes that are concerning for multi lobar pneumonia.  Add on lactic acid, blood cultures and initiate treatment with Rocephin and Zithromax.  Lactic acid is elevated at 3.3.  With patient's history of congestive heart failure and recent hospitalizations for volume overload, will give a 1 L bolus and then monitor closely.  Patient will require hospitalization for further management.  CRITICAL CARE Performed by: Orpah Greek   Total critical care time: 30 minutes  Critical care time was exclusive of separately billable procedures and treating other patients.  Critical care was necessary to treat or prevent imminent or life-threatening deterioration.  Critical care was time spent personally by me on the following activities: development of treatment plan with patient and/or surrogate as well as nursing, discussions with consultants, evaluation of patient's response to treatment, examination of patient, obtaining history from patient or surrogate, ordering and performing treatments and interventions, ordering and review of laboratory studies, ordering and review of radiographic studies, pulse oximetry and re-evaluation of patient's condition.   Final Clinical Impressions(s) / ED Diagnoses   Final diagnoses:  Community acquired pneumonia of left lung, unspecified part of lung    ED Discharge Orders    None       Lezlie Ritchey, Gwenyth Allegra, MD 08/07/19 2703    Orpah Greek, MD 08/07/19 5009    Orpah Greek, MD 08/07/19 [phone]

## 2019-08-07 NOTE — ED Notes (Signed)
Pt reporting worsening pain and points to epigastric area and states it is radiating to her back. She has also vomited. Dr. Tamala Julian informed

## 2019-08-07 NOTE — ED Notes (Signed)
Date and time results received: 08/07/19 0739 (use smartphrase ".now" to insert current time)  Test: lactic  Critical Value: 3.3  Name of Provider Notified: Dr. Betsey Holiday  Orders Received? Or Actions Taken?: Aware, waiting orders

## 2019-08-07 NOTE — ED Notes (Signed)
Lunch Tray Ordered @ 1056. 

## 2019-08-07 NOTE — Progress Notes (Signed)
Notified bedside nurse of need to order lactic acid.  Beside rn notified though secure chat for the need of a repeat lactic acid as well as an order to obtain one. RN wrote back acknowledging this.

## 2019-08-07 NOTE — ED Notes (Signed)
Purewick applied to pt °

## 2019-08-07 NOTE — ED Notes (Signed)
Dr. Smith at bedside.

## 2019-08-07 NOTE — H&P (Addendum)
History and Physical    Julie Rice WUJ:[id number] DOB: 06/29/1936 DOA: 08/07/2019  Referring MD/NP/PA: Jaci Carrelhristopher Pollina, MD PCP: [name], [name], NP  Patient coming from: Home via EMS  Chief Complaint: Shortness of breath  I have personally briefly reviewed patient's old medical records in [facility]   HPI: Julie ChanceDelores C Dehne is a 83 y.o. female with medical history significant of hypertension, hyperlipidemia, systolic CHF, fibromyalgia with chronic pain, anemia, esophageal dysmotility, asthma/COPD, oxygen dependent on 2 L, CAD, TIA, and bipolar disorder; who presents with complaints of shortness of breath and chest pain starting this morning around 3-4 AM.  Symptoms woke her out of her sleep.  She complained of severe chest pain with radiation to the left side of her chest and back.  Patient was unable to get out of bed to call for help.  Associated symptoms included complaints of diaphoresis, fever, malaise, nausea, cough with brownish sputum, and vomiting.  Patient had just recently been hospitalized from 9/17-9/22, diagnosed with acute congestive heart failure exacerbation.  Patient underwent right/left heart catheterization which showed right dominant coronary anatomy with widely patent left main with 30 to 40% stenosis of the mid LAD.  She reports that her weight has been stable around 138-139 pounds.  She has been taking all medications as prescribed.  On EMS arrival they had to break down the door to get inside her home and found her to be hypoxic down to 70% on 3 L.  She was given with sublingual nitroglycerin and placed on CPAP with improvement of symptoms.  ED Course: Upon admission into the emergency department patient was seen to be febrile up to 100 F, pulse 99-109, respirations 24-26, and blood pressures 117/58-126/57.  Patient had initially arrived on CPAP, but was switched to a nonrebreather mask and has been able to be weaned down to 3 L of nasal cannula oxygen.  Labs  yesterday significant for WBC 10.5, hemoglobin 9.8, BNP 748.8, troponin 18, and lactic acid 3.3.  ABG revealed pH of 7.423, PCO2 39.8, and PO2 65.  Chest x-ray significant for extensive airspace disease in left upper and lower lobe, and to a lesser extent right lower lobe concerning for multilobar pneumonia.  COVID-19 screening was negative.  Blood cultures were obtained.  Patient was given 1 L normal saline IV fluids, nitroglycerin paste, Rocephin, and azithromycin.  TRH called to admit for suspected pneumonia.  Review of Systems  Constitutional: Positive for diaphoresis, fever and malaise/fatigue.  HENT: Negative for ear pain and nosebleeds.   Eyes: Negative for photophobia and discharge.  Respiratory: Positive for cough, sputum production and shortness of breath.   Cardiovascular: Positive for chest pain. Negative for leg swelling.  Gastrointestinal: Positive for nausea and vomiting. Negative for diarrhea.  Genitourinary: Negative for hematuria.  Musculoskeletal: Negative for falls and myalgias.  Skin: Negative for rash.  Neurological: Negative for sensory change and loss of consciousness.  Psychiatric/Behavioral: Negative for memory loss and substance abuse.    Past Medical History:  Diagnosis Date   Anemia    Anxiety    Bipolar disorder (HCC)    Blood transfusion 1991   autologous pts own blood given    CAD (coronary artery disease)    Cataract    Chest pain    "@ rest, lying down, w/exertion"   Chronic back pain    "mostly lower back but I do have upper back pain regularly" (04/06/2018)   COPD (chronic obstructive pulmonary disease) (HCC)    Depression  Esophageal stricture    Fibromyalgia    GERD (gastroesophageal reflux disease)    Heart murmur    "slight" (04/06/2018)   Hiatal hernia    Hyperlipidemia    Patient denies   Hypertension    Internal hemorrhoids    Lumbago    Mitral regurgitation    Osteoarthritis    Osteoporosis    Pneumonia ~  01/2018   PONV (postoperative nausea and vomiting)    severe ponv, "in the past" (04/06/2018)   Rectal bleeding    Rheumatoid arthritis (HCC)    Spondylosis    TIA (transient ischemic attack) 2013   Urinary incontinence    wears depends    Past Surgical History:  Procedure Laterality Date   ABDOMINAL HYSTERECTOMY  1982   BACK SURGERY     BALLOON DILATION N/A 09/21/2018   Procedure: BALLOON DILATION;  Surgeon: Hilarie Fredrickson, MD;  Location: [facility];  Service: Endoscopy;  Laterality: N/A;   BLADDER SUSPENSION  1980's   BOTOX INJECTION N/A 09/21/2018   Procedure: BOTOX INJECTION;  Surgeon: Hilarie Fredrickson, MD;  Location: [facility];  Service: Endoscopy;  Laterality: N/A;   CATARACT EXTRACTION W/ INTRAOCULAR LENS  IMPLANT, BILATERAL Bilateral 2010   DILATION AND CURETTAGE OF UTERUS  1961   ESOPHAGEAL MANOMETRY N/A 03/25/2018   Procedure: ESOPHAGEAL MANOMETRY (EM);  Surgeon: Napoleon Form, MD;  Location: [facility];  Service: Endoscopy;  Laterality: N/A;   ESOPHAGOGASTRODUODENOSCOPY (EGD) WITH PROPOFOL N/A 04/07/2018   Procedure: ESOPHAGOGASTRODUODENOSCOPY (EGD) WITH PROPOFOL;  Surgeon: Sherrilyn Rist, MD;  Location: [facility] ENDOSCOPY;  Service: Gastroenterology;  Laterality: N/A;   ESOPHAGOGASTRODUODENOSCOPY (EGD) WITH PROPOFOL N/A 09/21/2018   Procedure: ESOPHAGOGASTRODUODENOSCOPY (EGD) WITH PROPOFOL;  Surgeon: Hilarie Fredrickson, MD;  Location: [facility];  Service: Endoscopy;  Laterality: N/A;   HERNIA REPAIR     HIATAL HERNIA REPAIR N/A 08/02/2016   Procedure: LAPAROSCOPIC REPAIR OF LARGE  HIATAL HERNIA;  Surgeon: Glenna Fellows, MD;  Location: [facility];  Service: General;  Laterality: N/A;   JOINT REPLACEMENT     LAPAROSCOPIC NISSEN FUNDOPLICATION N/A 08/02/2016   Procedure: LAPAROSCOPIC NISSEN FUNDOPLICATION;  Surgeon: Glenna Fellows, MD;  Location: [facility];  Service: General;  Laterality: N/A;   LUMBAR LAMINECTOMY  1990; 1994; [id number]   "I've got 2  stainless steel rods; 6 screws; 2 ray cages"took bone from right hip to put in back   RIGHT/LEFT HEART CATH AND CORONARY ANGIOGRAPHY N/A 07/26/2019   Procedure: RIGHT/LEFT HEART CATH AND CORONARY ANGIOGRAPHY;  Surgeon: Lyn Records, MD;  Location: [facility];  Service: Cardiovascular;  Laterality: N/A;   TONSILLECTOMY AND ADENOIDECTOMY  1945   TOTAL KNEE ARTHROPLASTY Right ~ 1996   TUBAL LIGATION  ~ 1976     reports that she has never smoked. She has never used smokeless tobacco. She reports that she does not drink alcohol or use drugs.  Allergies  Allergen Reactions   Morphine Other (See Comments)    Flushing, rash, itching   Tape Other (See Comments)    Band aides, adhesive tape Redness and pulls skin off    Family History  Problem Relation Age of Onset   Kidney disease Mother    Hypertension Mother    Heart disease Father 47       die of MI at age 47   Suicidality Son    Heart disease Brother    Heart attack Brother    Colon cancer Neg Hx    Esophageal cancer  Neg Hx    Pancreatic cancer Neg Hx    Rectal cancer Neg Hx    Stomach cancer Neg Hx     Prior to Admission medications   Medication Sig Start Date End Date Taking? Authorizing Provider  acetaminophen (TYLENOL) 325 MG tablet Take 2 tablets (650 mg total) by mouth every 4 (four) hours as needed for headache or mild pain. 07/27/19   Drema Dallas, MD  albuterol (VENTOLIN HFA) 108 (90 Base) MCG/ACT inhaler Inhale 2 puffs into the lungs every 6 (six) hours as needed for wheezing or shortness of breath. 02/23/19   [name], Tammy S, NP  amLODipine (NORVASC) 2.5 MG tablet Take 2.5 mg by mouth daily.    [provider]  aspirin EC 81 MG EC tablet Take 1 tablet (81 mg total) by mouth daily. 07/28/19   Drema Dallas, MD  carvedilol (COREG) 3.125 MG tablet TAKE 1 TABLET BY MOUTH TWICE A DAY WITH A MEAL 07/30/19   Nafziger, [name], NP  clonazePAM (KLONOPIN) 0.5 MG tablet TAKE 1 TABLET BY MOUTH  TWICE A DAY AS NEEDED FOR ANXIETY 05/12/19   Nafziger, [name], NP  ferrous sulfate 325 (65 FE) MG tablet Take 1 tablet (325 mg total) by mouth daily with breakfast. 07/28/19   Drema Dallas, MD  gabapentin (NEURONTIN) 100 MG capsule TAKE 1 CAPSULE BY MOUTH THREE TIMES A DAY 07/13/19   Nafziger, [name], NP  meloxicam (MOBIC) 15 MG tablet Take 1 tablet (15 mg total) by mouth daily. 11/05/18   Nafziger, [name], NP  Multiple Vitamin (MULTIVITAMIN) tablet Take 1 tablet by mouth daily. Vitamin supplement. 02/07/12   Viviann Spare, FNP  pantoprazole (PROTONIX) 40 MG tablet Take 1 tablet (40 mg total) by mouth 2 (two) times daily. 08/31/18   Tressia Danas, MD  sacubitril-valsartan (ENTRESTO) 24-26 MG Take 1 tablet by mouth 2 (two) times daily. 07/27/19   Drema Dallas, MD  sertraline (ZOLOFT) 100 MG tablet TAKE 1 TABLET BY MOUTH EVERYDAY AT BEDTIME 07/16/19   Nafziger, [name], NP  spironolactone (ALDACTONE) 25 MG tablet Take 0.5 tablets (12.5 mg total) by mouth daily. 07/28/19   Drema Dallas, MD  sucralfate (CARAFATE) 1 g tablet Take 1 tablet (1 g total) by mouth 4 (four) times daily. Dissolve tablet in 10 ml warm water Patient taking differently: Take 1 g by mouth 4 (four) times daily as needed (heartburn). Dissolve tablet in 10 ml warm water 08/31/18   Tressia Danas, MD  tetrahydrozoline 0.05 % ophthalmic solution Place 1 drop into both eyes daily.    [provider]    Physical Exam:  Constitutional: Elderly female who appears acutely ill and distressed Vitals:   08/07/19 0613 08/07/19 0615 08/07/19 0645 08/07/19 0700  BP:  123/70 (!) 126/57 (!) 117/58  Pulse:  (!) 109 99 93  Resp:  (!) 26 (!) 24 19  Temp:      TempSrc:      SpO2:  100% 100% 100%  Weight: 63.7 kg     Height: 5\' 3"  (1.6 m)      Eyes: PERRL, lids and conjunctivae normal ENMT: Mucous membranes are moist. Posterior pharynx clear of any exudate or lesions.  Neck: normal, supple, no masses, no thyromegaly Respiratory:  Tachypneic with positive crackles heard mid to lower lung fields most notably on the left.  Brownish sputum production and bucket at bedside. Cardiovascular: Tachycardic, no murmurs / rubs / gallops. No extremity edema. 2+ pedal pulses. No carotid bruits.  Abdomen:  no tenderness, no masses palpated. No hepatosplenomegaly. Bowel sounds positive.  Musculoskeletal: no clubbing / cyanosis. No joint deformity upper and lower extremities. Good ROM, no contractures. Normal muscle tone.  Skin: no rashes, lesions, ulcers. No induration Neurologic: CN 2-12 grossly intact. Sensation intact, DTR normal. Strength 5/5 in all 4.  Psychiatric: Normal judgment and insight. Alert and oriented x 3. Normal mood.     Labs on Admission: I have personally reviewed following labs and imaging studies  CBC: Recent Labs  Lab 08/07/19 0710 08/07/19 0919  WBC 10.5  --   NEUTROABS 9.6*  --   HGB 9.8* 8.8*  HCT 33.3* 26.0*  MCV 94.3  --   PLT 244  --    Basic Metabolic Panel: Recent Labs  Lab 08/07/19 0710 08/07/19 0919  NA 141 142  K 3.5 3.1*  CL 103  --   CO2 27  --   GLUCOSE 158*  --   BUN 14  --   CREATININE 0.89  --   CALCIUM 9.3  --    GFR: Estimated Creatinine Clearance: 44.5 mL/min (by C-G formula based on SCr of 0.89 mg/dL). Liver Function Tests: No results for input(s): AST, ALT, ALKPHOS, BILITOT, PROT, ALBUMIN in the last 168 hours. No results for input(s): LIPASE, AMYLASE in the last 168 hours. No results for input(s): AMMONIA in the last 168 hours. Coagulation Profile: No results for input(s): INR, PROTIME in the last 168 hours. Cardiac Enzymes: No results for input(s): CKTOTAL, CKMB, CKMBINDEX, TROPONINI in the last 168 hours. BNP (last 3 results) Recent Labs    01/12/19 1427  PROBNP 196.0*   HbA1C: No results for input(s): HGBA1C in the last 72 hours. CBG: No results for input(s): GLUCAP in the last 168 hours. Lipid Profile: No results for input(s): CHOL, HDL, LDLCALC,  TRIG, CHOLHDL, LDLDIRECT in the last 72 hours. Thyroid Function Tests: No results for input(s): TSH, T4TOTAL, FREET4, T3FREE, THYROIDAB in the last 72 hours. Anemia Panel: No results for input(s): VITAMINB12, FOLATE, FERRITIN, TIBC, IRON, RETICCTPCT in the last 72 hours. Urine analysis:    Component Value Date/Time   COLORURINE STRAW (A) 02/01/2018 1643   APPEARANCEUR CLEAR 02/01/2018 1643   LABSPEC 1.008 02/01/2018 1643   PHURINE 7.0 02/01/2018 1643   GLUCOSEU NEGATIVE 02/01/2018 1643   HGBUR NEGATIVE 02/01/2018 1643   HGBUR negative 07/19/2009 1509   BILIRUBINUR Negative 07/31/2018 1055   KETONESUR NEGATIVE 02/01/2018 1643   PROTEINUR Positive (A) 07/31/2018 1055   PROTEINUR NEGATIVE 02/01/2018 1643   UROBILINOGEN 0.2 07/31/2018 1055   UROBILINOGEN 0.2 02/05/2012 0058   NITRITE Positive 07/31/2018 1055   NITRITE NEGATIVE 02/01/2018 1643   LEUKOCYTESUR Large (3+) (A) 07/31/2018 1055   Sepsis Labs: Recent Results (from the past 240 hour(s))  SARS Coronavirus 2 [facility](Hospital order, [facility]) Nasopharyngeal Nasopharyngeal Swab     Status: None   Collection Time: 08/07/19  6:10 AM   Specimen: Nasopharyngeal Swab  Result Value Ref Range Status   SARS Coronavirus 2 NEGATIVE NEGATIVE Final    Comment: (NOTE) If result is NEGATIVE SARS-CoV-2 target nucleic acids are NOT DETECTED. The SARS-CoV-2 RNA is generally detectable in upper and lower  respiratory specimens during the acute phase of infection. The lowest  concentration of SARS-CoV-2 viral copies this assay can detect is 250  copies / mL. A negative result does not preclude SARS-CoV-2 infection  and should not be used as the sole basis for treatment or other  patient management decisions.  A negative result may occur with  improper specimen collection / handling, submission of specimen other  than nasopharyngeal swab, presence of viral mutation(s) within the  areas targeted by this assay, and  inadequate number of viral copies  (<250 copies / mL). A negative result must be combined with clinical  observations, patient history, and epidemiological information. If result is POSITIVE SARS-CoV-2 target nucleic acids are DETECTED. The SARS-CoV-2 RNA is generally detectable in upper and lower  respiratory specimens dur ing the acute phase of infection.  Positive  results are indicative of active infection with SARS-CoV-2.  Clinical  correlation with patient history and other diagnostic information is  necessary to determine patient infection status.  Positive results do  not rule out bacterial infection or co-infection with other viruses. If result is PRESUMPTIVE POSTIVE SARS-CoV-2 nucleic acids MAY BE PRESENT.   A presumptive positive result was obtained on the submitted specimen  and confirmed on repeat testing.  While 2019 novel coronavirus  (SARS-CoV-2) nucleic acids may be present in the submitted sample  additional confirmatory testing may be necessary for epidemiological  and / or clinical management purposes  to differentiate between  SARS-CoV-2 and other Sarbecovirus currently known to infect humans.  If clinically indicated additional testing with an alternate test  methodology [phone]) is advised. The SARS-CoV-2 RNA is generally  detectable in upper and lower respiratory sp ecimens during the acute  phase of infection. The expected result is Negative. Fact Sheet for Patients:  StrictlyIdeas.no Fact Sheet for Healthcare Providers: BankingDealers.co.za This test is not yet approved or cleared by the [place] FDA and has been authorized for detection and/or diagnosis of SARS-CoV-2 by FDA under an Emergency Use Authorization (EUA).  This EUA will remain in effect (meaning this test can be used) for the duration of the COVID-19 declaration under Section 564(b)(1) of the Act, 21 U.S.C. section 360bbb-3(b)(1), unless the  authorization is terminated or revoked sooner. Performed at [facility], [place] [address], [place], [place] [address]      Radiological Exams on Admission: Dg Chest Portable 1 View  Result Date: 08/07/2019 CLINICAL DATA:  Recurrent chest pain, shortness of breath EXAM: PORTABLE CHEST 1 VIEW COMPARISON:  07/22/2019 FINDINGS: Extensive left upper lobe and left lower lobe airspace disease. Mild right lower lobe airspace disease. No right pleural effusion. Possible trace left pleural effusion. No pneumothorax. Stable cardiomegaly. No acute osseous abnormality. Moderate osteoarthritis of the left glenohumeral joint. IMPRESSION: Extensive airspace disease in the left upper lobe and left lower lobe and to a much lesser extent right lower lobe most concerning for multilobar pneumonia. Electronically Signed   By: Kathreen Devoid   On: 08/07/2019 06:26    EKG: Independently reviewed.  Sinus tachycardia 109 bpm with QTc 522  Assessment/Plan Sepsis secondary to suspected multi lobar pneumonia: Acute.  Patient presented febrile up to 100 F, tachycardic, and tachypneic with WBC at the upper limit of normal.  Lactic acid elevated up to 3.3.  Chest x-ray showing bilateral opacities concerning for multifocal pneumonia.  COVID-19 screening negative.  Blood cultures were obtained and patient was empirically started on antibiotics of Rocephin and azithromycin.  Patient was given 1 L of normal saline IV fluid, but full fluid bolus held due to heart failure history. -Admit to the medical telemetry bed -Aspiration precautions -Follow-up blood, urine, and sputum cultures -Add-on procalcitonin(2.8) -Continue empiric antibiotics of Rocephin  -Discontinue azithromycin due to prolonged QT interval -Doxycycline IV -Mucinex -Albuterol nebs as needed  -Trend  lactic acid level  Acute on chronic respiratory failure with hypoxia: Patient's initial O2 saturations were noted to be into the 70s on home 3 L of oxygen.  EMS  initially placed the patient on CPAP, but was able to be weaned from nonrebreather down to on 3 L while in the emergency department.  ABG appeared compensated with pH 7.423, PO2 65, P CO2 39.8. -Continuous pulse oximetry with nasal cannula oxygen to maintain O2 saturation greater than 92%.   Chest pain, elevated troponin: Acute.  Patient reports having left-sided chest pain radiating to the left side and her back.  Troponin initially 18 and repeat 76.  Recent heart cath last month revealed nonobstructive disease.  Suspect chest pain related to pneumonia -Fentanyl Prn pain  Prolonged QT interval: On  admission QTc 522. -Recheck EKG -Correct electrolyte abnormalities  Hypokalemia: On repeat labs this morning potassium noted to be 3.1.  Patient reportedly having nausea and vomiting. -Try to give potassium chloride 40 mg p.o. and 20 mEq IV -Continue to monitor and replace as needed  Systolic congestive heart failure: Patient reports no change in weight and she stayed around 138 to 139 pounds.  Initial BNP elevated at 748.8 which appears similar to previous.  Patient did receive 1 L normal saline IV fluids in the ED.  Crackles heard on physical exam. -Daily weights -Strict intake and output -Give Lasix IV 20 mg x 2 dose given over the course of the day -Reassess in a.m. for need of continued IV diuresis  Normocytic normochromic anemia: Acute on chronic hemoglobin 9.8 on admission which appears near patient's baseline.  Repeat hemoglobin this morning after patient received 1 L of normal saline fluids with 8.8.  Question dilutional versus possibility of patient acutely bleeding. -Check gastric occult sputum -Serial monitoring of H&H -Type and screen -Notify MD if hemoglobin less than 7  Essential hypertension: Blood pressures appear stable at this time. -Continue amlodipine, carvedilol, spironolactone, and Entresto   Chronic pain/fibromyalgia -Tylenol as needed for pain  Bipolar  disorder/anxiety -Continue Zoloft and clonazepamprn for anxiety  GERD/esophageal dysmotility: Patient with previous history of fundoplication and post fundoplication dis-fascia secondary to dysmotility.  GI had plan to do repeat EGD with Botox and balloon dilation next month. -Discontinue Protonix due to prolonged QT -Clear liquid diet for now and we will need to advance as tolerated -Continue Carafate  -May warrant consulting Dr. [name] as he was supposed to do a repeat Botox injection sometime this month of symptoms seem to be related to her difficulty swallowing   DVT prophylaxis: Lovenox Code Status: Full Family Communication: No family present at bedside Disposition Plan: To be determined Consults called: None Admission status: inpatient  Clydie Braun MD [facility] Pager [phone]   If 7PM-7AM, please contact night-coverage [email or web address] Password [facility]  08/07/2019, 9:33 AM

## 2019-08-07 NOTE — ED Triage Notes (Signed)
Pt BIB [facility] c/o recurrent CP and SOB. Presents to ED on CPAP. EMS reports weakness on arrival 70% SpO2 on 3 L (continous home oxygen at 3L) HX COPD, Asthma and intubation.   EMS VS BP 170/110 HR 120 CBG 132 T 97.9  Triage 100% NR 15L. Difficulty communicating d/t SOB

## 2019-08-07 NOTE — ED Notes (Signed)
I stat ABG drawn by respiratory technician

## 2019-08-07 NOTE — ED Notes (Signed)
X-ray at bedside

## 2019-08-08 LAB — CBC
HCT: 26 % — ABNORMAL LOW (ref 36.0–46.0)
Hemoglobin: 8.1 g/dL — ABNORMAL LOW (ref 12.0–15.0)
MCH: 28.4 pg (ref 26.0–34.0)
MCHC: 31.2 g/dL (ref 30.0–36.0)
MCV: 91.2 fL (ref 80.0–100.0)
Platelets: 168 10*3/uL (ref 150–400)
RBC: 2.85 MIL/uL — ABNORMAL LOW (ref 3.87–5.11)
RDW: 15.8 % — ABNORMAL HIGH (ref 11.5–15.5)
WBC: 12.2 10*3/uL — ABNORMAL HIGH (ref 4.0–10.5)
nRBC: 0 % (ref 0.0–0.2)

## 2019-08-08 LAB — BASIC METABOLIC PANEL
Anion gap: 7 (ref 5–15)
BUN: 16 mg/dL (ref 8–23)
CO2: 25 mmol/L (ref 22–32)
Calcium: 8 mg/dL — ABNORMAL LOW (ref 8.9–10.3)
Chloride: 108 mmol/L (ref 98–111)
Creatinine, Ser: 0.78 mg/dL (ref 0.44–1.00)
GFR calc Af Amer: >60 mL/min (ref >60)
GFR calc non Af Amer: >60 mL/min (ref >60)
Glucose, Bld: 94 mg/dL (ref 70–99)
Potassium: 3.5 mmol/L (ref 3.5–5.1)
Sodium: 140 mmol/L (ref 135–145)

## 2019-08-08 LAB — EXPECTORATED SPUTUM ASSESSMENT W GRAM STAIN, RFLX TO RESP C

## 2019-08-08 LAB — STREP PNEUMONIAE URINARY ANTIGEN: Strep Pneumo Urinary Antigen: NEGATIVE

## 2019-08-08 LAB — TROPONIN I (HIGH SENSITIVITY)
Troponin I (High Sensitivity): 58 ng/L — ABNORMAL HIGH (ref <18)
Troponin I (High Sensitivity): 50 ng/L — ABNORMAL HIGH (ref <18)

## 2019-08-08 LAB — LACTIC ACID, PLASMA: Lactic Acid, Venous: 1 mmol/L (ref 0.5–1.9)

## 2019-08-08 LAB — PROCALCITONIN: Procalcitonin: 7.7 ng/mL

## 2019-08-08 MED ORDER — POTASSIUM CHLORIDE 10 MEQ/100ML IV SOLN
INTRAVENOUS | Status: AC
  Administered 2019-08-08: 10 meq via INTRAVENOUS
  Filled 2019-08-08: qty 100

## 2019-08-08 MED ORDER — PIPERACILLIN-TAZOBACTAM 3.375 G IVPB 30 MIN
3.3750 g | Freq: Three times a day (TID) | INTRAVENOUS | Status: DC
  Administered 2019-08-08 – 2019-08-09 (×4): 3.375 g via INTRAVENOUS
  Filled 2019-08-08 (×7): qty 50

## 2019-08-08 MED ORDER — POTASSIUM CHLORIDE 10 MEQ/100ML IV SOLN
10.0000 meq | INTRAVENOUS | Status: AC
  Administered 2019-08-08 (×4): 10 meq via INTRAVENOUS
  Filled 2019-08-08 (×2): qty 100

## 2019-08-08 MED ORDER — POTASSIUM CHLORIDE 10 MEQ/100ML IV SOLN
INTRAVENOUS | Status: AC
  Filled 2019-08-08: qty 100

## 2019-08-08 NOTE — Evaluation (Signed)
Clinical/Bedside Swallow Evaluation Patient Details  Name: Julie Rice MRN: [id number] Date of Birth: 1936/01/25  Today's Date: 08/08/2019 Time: SLP Start Time (ACUTE ONLY): 1355 SLP Stop Time (ACUTE ONLY): 1406 SLP Time Calculation (min) (ACUTE ONLY): 11 min  Past Medical History:  Past Medical History:  Diagnosis Date  . Anemia   . Anxiety   . Bipolar disorder (HCC)   . Blood transfusion 1991   autologous pts own blood given   . CAD (coronary artery disease)   . Cataract   . Chest pain    "@ rest, lying down, w/exertion"  . Chronic back pain    "mostly lower back but I do have upper back pain regularly" (04/06/2018)  . COPD (chronic obstructive pulmonary disease) (HCC)   . Depression   . Esophageal stricture   . Fibromyalgia   . GERD (gastroesophageal reflux disease)   . Heart murmur    "slight" (04/06/2018)  . Hiatal hernia   . Hyperlipidemia    Patient denies  . Hypertension   . Internal hemorrhoids   . Lumbago   . Mitral regurgitation   . Osteoarthritis   . Osteoporosis   . Pneumonia ~ 01/2018  . PONV (postoperative nausea and vomiting)    severe ponv, "in the past" (04/06/2018)  . Rectal bleeding   . Rheumatoid arthritis (HCC)   . Spondylosis   . TIA (transient ischemic attack) 2013  . Urinary incontinence    wears depends   Past Surgical History:  Past Surgical History:  Procedure Laterality Date  . ABDOMINAL HYSTERECTOMY  1982  . BACK SURGERY    . BALLOON DILATION N/A 09/21/2018   Procedure: BALLOON DILATION;  Surgeon: Hilarie Fredrickson, MD;  Location: [facility];  Service: Endoscopy;  Laterality: N/A;  . BLADDER SUSPENSION  1980's  . BOTOX INJECTION N/A 09/21/2018   Procedure: BOTOX INJECTION;  Surgeon: Hilarie Fredrickson, MD;  Location: [facility];  Service: Endoscopy;  Laterality: N/A;  . CATARACT EXTRACTION W/ INTRAOCULAR LENS  IMPLANT, BILATERAL Bilateral 2010  . DILATION AND CURETTAGE OF UTERUS  1961  . ESOPHAGEAL MANOMETRY N/A 03/25/2018   Procedure:  ESOPHAGEAL MANOMETRY (EM);  Surgeon: Napoleon Form, MD;  Location: [facility];  Service: Endoscopy;  Laterality: N/A;  . ESOPHAGOGASTRODUODENOSCOPY (EGD) WITH PROPOFOL N/A 04/07/2018   Procedure: ESOPHAGOGASTRODUODENOSCOPY (EGD) WITH PROPOFOL;  Surgeon: Sherrilyn Rist, MD;  Location: [facility];  Service: Gastroenterology;  Laterality: N/A;  . ESOPHAGOGASTRODUODENOSCOPY (EGD) WITH PROPOFOL N/A 09/21/2018   Procedure: ESOPHAGOGASTRODUODENOSCOPY (EGD) WITH PROPOFOL;  Surgeon: Hilarie Fredrickson, MD;  Location: [facility];  Service: Endoscopy;  Laterality: N/A;  . HERNIA REPAIR    . HIATAL HERNIA REPAIR N/A 08/02/2016   Procedure: LAPAROSCOPIC REPAIR OF LARGE  HIATAL HERNIA;  Surgeon: Glenna Fellows, MD;  Location: [facility];  Service: General;  Laterality: N/A;  . JOINT REPLACEMENT    . LAPAROSCOPIC NISSEN FUNDOPLICATION N/A 08/02/2016   Procedure: LAPAROSCOPIC NISSEN FUNDOPLICATION;  Surgeon: Glenna Fellows, MD;  Location: [facility];  Service: General;  Laterality: N/A;  . LUMBAR LAMINECTOMY  1990; 1994; 3299;2426   "I've got 2 stainless steel rods; 6 screws; 2 ray cages"took bone from right hip to put in back  . RIGHT/LEFT HEART CATH AND CORONARY ANGIOGRAPHY N/A 07/26/2019   Procedure: RIGHT/LEFT HEART CATH AND CORONARY ANGIOGRAPHY;  Surgeon: Lyn Records, MD;  Location: [facility];  Service: Cardiovascular;  Laterality: N/A;  . TONSILLECTOMY AND ADENOIDECTOMY  1945  . TOTAL KNEE ARTHROPLASTY Right ~  1996  . TUBAL LIGATION  ~ 1976   HPI:  Julie Rice is a 83 y.o. female with medical history significant of hypertension, hyperlipidemia, systolic CHF, fibromyalgia with chronic pain, anemia, esophageal stricture, large symptomatic hiatal hernia status post fundoplication, post fundoplication dysphagia secondary to dysmotility (primary versus secondary with technically limited esophageal manometry), asthma/COPD, oxygen dependent on 2 L, CAD, TIA, and bipolar disorder; who  presented to ED on 08/07/2019 with complaints of shortness of breath and chest pain starting morning around 3-4 AM.  Chest x-ray significant for extensive airspace disease in left upper and lower lobe, and to a lesser extent right lower lobe concerning for multilobar pneumonia.  COVID-19 screening was negative.   Assessment / Plan / Recommendation Clinical Impression  Patient present with what appears to be a largely normal oropharyngeal swallow with thin liquids. Solids not tested today given recent h/o vomitting and dietary restrictions per GI. Patient however with no obvious s/s of aspiration. Frequent belching post swallow and c/o globus consistent with GI history. Cannot r/o occassional aspiration given reports of frequent regurgitation, likely accounting for recurrent PNA. SLP provided patient with reflux education including consistency restrictions and aspiration precautions. Unclear based on MD note if additional GI intervention is being planned for current admission. RN unaware. SLP will continue to f/u, particularly for skilled observation with solid pos once allowed.  SLP Visit Diagnosis: Dysphagia, unspecified (R13.10)    Aspiration Risk  Moderate aspiration risk    Diet Recommendation Thin liquid(clear liquids)   Liquid Administration via: Cup;Straw Medication Administration: Crushed with puree Supervision: Patient able to self feed;Intermittent supervision to cue for compensatory strategies Compensations: Slow rate;Small sips/bites Postural Changes: Remain upright for at least 30 minutes after po intake;Seated upright at 90 degrees    Other  Recommendations Oral Care Recommendations: Oral care BID      Frequency and Duration min 2x/week  1 week           Swallow Study   General HPI: Julie Rice is a 83 y.o. female with medical history significant of hypertension, hyperlipidemia, systolic CHF, fibromyalgia with chronic pain, anemia, esophageal stricture, large symptomatic  hiatal hernia status post fundoplication, post fundoplication dysphagia secondary to dysmotility (primary versus secondary with technically limited esophageal manometry), asthma/COPD, oxygen dependent on 2 L, CAD, TIA, and bipolar disorder; who presented to ED on 08/07/2019 with complaints of shortness of breath and chest pain starting morning around 3-4 AM.  Chest x-ray significant for extensive airspace disease in left upper and lower lobe, and to a lesser extent right lower lobe concerning for multilobar pneumonia.  COVID-19 screening was negative. Type of Study: Bedside Swallow Evaluation Diet Prior to this Study: Thin liquids(clear liquids) Temperature Spikes Noted: No Respiratory Status: Nasal cannula History of Recent Intubation: No Behavior/Cognition: Alert;Cooperative;Pleasant mood Oral Cavity Assessment: Within Functional Limits Oral Care Completed by SLP: Recent completion by staff Oral Cavity - Dentition: Adequate natural dentition Vision: Functional for self-feeding Self-Feeding Abilities: Able to feed self Patient Positioning: Upright in bed Baseline Vocal Quality: Normal Volitional Cough: Strong Volitional Swallow: Able to elicit    Oral/Motor/Sensory Function Overall Oral Motor/Sensory Function: Within functional limits(except subtle right facial assymetry, ?baseline per pt)   Ice Chips Ice chips: Not tested   Thin Liquid Thin Liquid: Within functional limits Presentation: Cup;Self Fed;Straw    Nectar Thick Nectar Thick Liquid: Not tested   Honey Thick Honey Thick Liquid: Not tested   Puree Puree: Not tested   Solid    Julie Rice  Julie Mckellips MA, CCC-SLP   Solid: Not tested      Julie Rice 08/08/2019,2:08 PM

## 2019-08-08 NOTE — Progress Notes (Addendum)
PROGRESS NOTE    Julie Rice  XKG:[id number] DOB: November 15, 1935 DOA: 08/07/2019 PCP: Shirline Frees, NP   Brief Narrative:  Julie Rice is a 83 y.o. female with medical history significant of hypertension, hyperlipidemia, systolic CHF, fibromyalgia with chronic pain, anemia, esophageal stricture, large symptomatic hiatal hernia status post fundoplication, post fundoplication dysphagia secondary to dysmotility (primary versus secondary with technically limited esophageal manometry), asthma/COPD, oxygen dependent on 2 L, CAD, TIA, and bipolar disorder; who presented to ED on 08/07/2019 with complaints of shortness of breath and chest pain starting morning around 3-4 AM.  Symptoms woke her out of her sleep.  Chest pain was radiating to the left side of the back.  She also had associated diaphoresis, fever, malaise, nausea and cough with brownish sputum and vomiting. On EMS arrival they had to break down the door to get inside her home and found her to be hypoxic down to 70% on 3 L.  She was given with sublingual nitroglycerin and placed on CPAP with improvement of symptoms. Upon arrival to the ED, blood pressure was 100 F, slightly tachycardic and tachypneic with normal blood pressure.  Patient initially arrived on CPAP but was switched to nonrebreather mask and was then weaned down soon to 3 L of nasal collar.  Her lactic acid was 3.3.  Margins were 10.5. hemoglobin 9.8, BNP 748.8, troponin 18, and lactic acid 3.3.  ABG revealed pH of 7.423, PCO2 39.8, and PO2 65.  Chest x-ray significant for extensive airspace disease in left upper and lower lobe, and to a lesser extent right lower lobe concerning for multilobar pneumonia.  COVID-19 screening was negative.  Blood cultures were obtained.  Patient was given 1 L normal saline IV fluids, nitroglycerin paste, Rocephin, and azithromycin.  TRH called to admit for suspected pneumonia.  She was admitted with acute on chronic hypoxic respiratory failure due to  pneumonia and was continued on Rocephin and doxycycline.   Assessment & Plan:   Active Problems:   Hypokalemia   Chest pain   Multifocal pneumonia   Prolonged QT interval   Sepsis (HCC)   Acute on chronic respiratory failure with hypoxia (HCC)  Sepsis and acute on chronic hypoxic respiratory failure secondary to recurrent aspiration pneumonia: After talking to patient, she tells me that she does have a history of choking and aspirating and in fact she had aspiration pneumonia just last year.  She tells me that she has esophageal stricture and she gets dilatation and Botox periodically and that she is a scheduled to have EGD on this Tuesday with Dr. [name].  She seems to have extensive aspiration pneumonia.  I will keep her n.p.o. and consult SLP.  We will switch her Rocephin to Zosyn and continue doxycycline.  Her lactic acid has normalized now.  Procalcitonin 7.7.  Urine antigen for Streptococcus negative.  Legionella urine antigen pending.  COVID-19 negative.  She is back to 2 to 3 L of oxygen which is her baseline.  Chest pain/ elevated troponin: Apparently she did complain of having chest pain at the time of admission.  She did not complain of any chest pain today.  Her troponin initially was 18 and repeat was 76.  Normal troponins.  I will check a couple more troponins.  EKG did not have any ST-T wave changes.  This could very well be due to demand ischemia.  Monitor on telemetry.  History of esophageal stricture status post dilatations multiple times/esophageal dysmotility: Reportedly, patient vomited 4 times last night and  2-3 of them were bloody and that was tested positive for Hemoccult as well.  Patient tells me that she tends to have cough and vomiting most of the nights and some of the nights she does see coffee-ground emesis.  She tells me that she has notified her GI doctor since she is scheduled to have a EGD as an outpatient on Tuesday.  At this point in time, I will monitor her, keep  her n.p.o., SLP consulted.  If she will have recurrent problems with dropping H&H, will consult GI to assess her here.  Hypokalemia: Potassium was 3.1 yesterday.  3.5 today.  We will give her some more potassium to prevent hypokalemia.  Chronic systolic congestive heart failure: Euvolemic.  Continue carvedilol, Entresto and Aldactone.  Essential hypertension: Controlled.  Continue current regimen.  Chronic pain/fibromyalgia: PR Tylenol and Toradol.  Bipolar disorder/anxiety: Continue Zoloft and clonazepam.  DVT prophylaxis: Lovenox Code Status: Full code Family Communication:  None present at bedside.  Plan of care discussed with patient in length and she verbalized understanding and agreed with it. Disposition Plan: TBD  Consultants:   None  Procedures:   None  Antimicrobials:   IV Zosyn and doxycycline   Subjective: Patient seen and examined.  She states that she feels much.  She was in fact not on any oxygen when I saw her and was not having feeling dyspneic or tachypneic.  Objective: Vitals:   08/07/19 1730 08/07/19 2100 08/07/19 2356 08/08/19 0835  BP: (!) 143/65 (!) 126/51 (!) 142/66 139/72  Pulse: 86 80 80 81  Resp: 18 18 18 16   Temp:  97.8 F (36.6 C) 98 F (36.7 C) 98.3 F (36.8 C)  TempSrc:  Oral Oral Oral  SpO2: 97% 99% 99% 100%  Weight:      Height:        Intake/Output Summary (Last 24 hours) at 08/08/2019 1254 Last data filed at 08/08/2019 0600 Gross per 24 hour  Intake 1400 ml  Output --  Net 1400 ml   Filed Weights   08/07/19 10/07/19  Weight: 63.7 kg    Examination:  General exam: Appears calm and comfortable  Respiratory system: Coarse breath sounds with rhonchi mostly in the left middle and lower lobe and upper lobe and right middle lobe. Respiratory effort normal. Cardiovascular system: S1 & S2 heard, RRR. No JVD, murmurs, rubs, gallops or clicks. No pedal edema. Gastrointestinal system: Abdomen is nondistended, soft and nontender. No  organomegaly or masses felt. Normal bowel sounds heard. Central nervous system: Alert and oriented. No focal neurological deficits. Extremities: Symmetric 5 x 5 power. Skin: No rashes, lesions or ulcers Psychiatry: Judgement and insight appear normal. Mood & affect appropriate.    Data Reviewed: I have personally reviewed following labs and imaging studies  CBC: Recent Labs  Lab 08/07/19 0710 08/07/19 0919 08/07/19 1800 08/08/19 0643  WBC 10.5  --   --  12.2*  NEUTROABS 9.6*  --   --   --   HGB 9.8* 8.8* 9.3* 8.1*  HCT 33.3* 26.0* 29.4* 26.0*  MCV 94.3  --   --  91.2  PLT 244  --   --  168   Basic Metabolic Panel: Recent Labs  Lab 08/07/19 0710 08/07/19 0919 08/08/19 0643  NA 141 142 140  K 3.5 3.1* 3.5  CL 103  --  108  CO2 27  --  25  GLUCOSE 158*  --  94  BUN 14  --  16  CREATININE 0.89  --  0.78  CALCIUM 9.3  --  8.0*   GFR: Estimated Creatinine Clearance: 49.5 mL/min (by C-G formula based on SCr of 0.78 mg/dL). Liver Function Tests: No results for input(s): AST, ALT, ALKPHOS, BILITOT, PROT, ALBUMIN in the last 168 hours. No results for input(s): LIPASE, AMYLASE in the last 168 hours. No results for input(s): AMMONIA in the last 168 hours. Coagulation Profile: No results for input(s): INR, PROTIME in the last 168 hours. Cardiac Enzymes: No results for input(s): CKTOTAL, CKMB, CKMBINDEX, TROPONINI in the last 168 hours. BNP (last 3 results) Recent Labs    01/12/19 1427  PROBNP 196.0*   HbA1C: No results for input(s): HGBA1C in the last 72 hours. CBG: No results for input(s): GLUCAP in the last 168 hours. Lipid Profile: No results for input(s): CHOL, HDL, LDLCALC, TRIG, CHOLHDL, LDLDIRECT in the last 72 hours. Thyroid Function Tests: No results for input(s): TSH, T4TOTAL, FREET4, T3FREE, THYROIDAB in the last 72 hours. Anemia Panel: No results for input(s): VITAMINB12, FOLATE, FERRITIN, TIBC, IRON, RETICCTPCT in the last 72 hours. Sepsis Labs: Recent  Labs  Lab 08/07/19 0709 08/07/19 1020 08/07/19 1130 08/07/19 1844 08/08/19 0643 08/08/19 0853  PROCALCITON  --  2.80  --   --  7.70  --   LATICACIDVEN 3.3*  --  0.9 2.0*  --  1.0    Recent Results (from the past 240 hour(s))  SARS Coronavirus 2 [facility] order, Performed in [facility] hospital lab) Nasopharyngeal Nasopharyngeal Swab     Status: None   Collection Time: 08/07/19  6:10 AM   Specimen: Nasopharyngeal Swab  Result Value Ref Range Status   SARS Coronavirus 2 NEGATIVE NEGATIVE Final    Comment: (NOTE) If result is NEGATIVE SARS-CoV-2 target nucleic acids are NOT DETECTED. The SARS-CoV-2 RNA is generally detectable in upper and lower  respiratory specimens during the acute phase of infection. The lowest  concentration of SARS-CoV-2 viral copies this assay can detect is 250  copies / mL. A negative result does not preclude SARS-CoV-2 infection  and should not be used as the sole basis for treatment or other  patient management decisions.  A negative result may occur with  improper specimen collection / handling, submission of specimen other  than nasopharyngeal swab, presence of viral mutation(s) within the  areas targeted by this assay, and inadequate number of viral copies  (<250 copies / mL). A negative result must be combined with clinical  observations, patient history, and epidemiological information. If result is POSITIVE SARS-CoV-2 target nucleic acids are DETECTED. The SARS-CoV-2 RNA is generally detectable in upper and lower  respiratory specimens dur ing the acute phase of infection.  Positive  results are indicative of active infection with SARS-CoV-2.  Clinical  correlation with patient history and other diagnostic information is  necessary to determine patient infection status.  Positive results do  not rule out bacterial infection or co-infection with other viruses. If result is PRESUMPTIVE POSTIVE SARS-CoV-2 nucleic acids MAY BE PRESENT.   A  presumptive positive result was obtained on the submitted specimen  and confirmed on repeat testing.  While 2019 novel coronavirus  (SARS-CoV-2) nucleic acids may be present in the submitted sample  additional confirmatory testing may be necessary for epidemiological  and / or clinical management purposes  to differentiate between  SARS-CoV-2 and other Sarbecovirus currently known to infect humans.  If clinically indicated additional testing with an alternate test  methodology [phone]) is advised. The SARS-CoV-2 RNA is generally  detectable in upper and  lower respiratory sp ecimens during the acute  phase of infection. The expected result is Negative. Fact Sheet for Patients:  BoilerBrush.com.cy Fact Sheet for Healthcare Providers: https://pope.com/ This test is not yet approved or cleared by the [place] FDA and has been authorized for detection and/or diagnosis of SARS-CoV-2 by FDA under an Emergency Use Authorization (EUA).  This EUA will remain in effect (meaning this test can be used) for the duration of the COVID-19 declaration under Section 564(b)(1) of the Act, 21 U.S.C. section 360bbb-3(b)(1), unless the authorization is terminated or revoked sooner. Performed at [facility], [address], [place], [place] [address]   Culture, blood (Routine X 2) w Reflex to ID Panel     Status: None (Preliminary result)   Collection Time: 08/07/19  7:40 AM   Specimen: BLOOD LEFT WRIST  Result Value Ref Range Status   Specimen Description BLOOD LEFT WRIST  Final   Special Requests   Final    BOTTLES DRAWN AEROBIC AND ANAEROBIC Blood Culture results may not be optimal due to an inadequate volume of blood received in culture bottles   Culture   Final    NO GROWTH 1 DAY Performed at [facility], [address], [place], [place] [address]    Report Status PENDING  Incomplete  Culture, blood (Routine X 2) w Reflex to ID  Panel     Status: None (Preliminary result)   Collection Time: 08/07/19  8:23 AM   Specimen: BLOOD RIGHT ARM  Result Value Ref Range Status   Specimen Description BLOOD RIGHT ARM  Final   Special Requests   Final    BOTTLES DRAWN AEROBIC AND ANAEROBIC Blood Culture adequate volume   Culture   Final    NO GROWTH 1 DAY Performed at [facility], [address] [place], [place] [address]    Report Status PENDING  Incomplete      Radiology Studies: Dg Chest Portable 1 View  Result Date: 08/07/2019 CLINICAL DATA:  Recurrent chest pain, shortness of breath EXAM: PORTABLE CHEST 1 VIEW COMPARISON:  07/22/2019 FINDINGS: Extensive left upper lobe and left lower lobe airspace disease. Mild right lower lobe airspace disease. No right pleural effusion. Possible trace left pleural effusion. No pneumothorax. Stable cardiomegaly. No acute osseous abnormality. Moderate osteoarthritis of the left glenohumeral joint. IMPRESSION: Extensive airspace disease in the left upper lobe and left lower lobe and to a much lesser extent right lower lobe most concerning for multilobar pneumonia. Electronically Signed   By: Elige Ko   On: 08/07/2019 06:26    Scheduled Meds:  amLODipine  2.5 mg Oral Daily   aspirin EC  81 mg Oral Daily   carvedilol  3.125 mg Oral BID WC   enoxaparin (LOVENOX) injection  40 mg Subcutaneous Q24H   gabapentin  100 mg Oral TID   guaiFENesin  600 mg Oral BID   sacubitril-valsartan  1 tablet Oral BID   sertraline  100 mg Oral QHS   sodium chloride flush  3 mL Intravenous Q12H   spironolactone  12.5 mg Oral Daily   Continuous Infusions:  doxycycline (VIBRAMYCIN) IV 100 mg (08/08/19 0439)   piperacillin-tazobactam       LOS: 1 day   Time spent: 41 minutes   Hughie Closs, MD [facility]  08/08/2019, 12:54 PM   To contact the attending provider between 7A-7P or the covering provider during after hours 7P-7A, please log into the web site [email or web address]  and use  password [email or web address].

## 2019-08-08 NOTE — Progress Notes (Signed)
Pt had a small amt of coffee ground emesis with mod amt of mucus. Gastrocult done and tested Positive for bld. Msg sent to Dr.Blount by Andee Poles RN CN

## 2019-08-08 NOTE — Progress Notes (Signed)
Antibiotics and 4 runs of potassium scheduled. Patient only has one IV access. Unssecessfull attempt at new site. Requested a consult from IV team.

## 2019-08-09 ENCOUNTER — Telehealth: Payer: Self-pay | Admitting: Internal Medicine

## 2019-08-09 DIAGNOSIS — J9621 Acute and chronic respiratory failure with hypoxia: Secondary | ICD-10-CM

## 2019-08-09 DIAGNOSIS — R1310 Dysphagia, unspecified: Secondary | ICD-10-CM

## 2019-08-09 LAB — CBC WITH DIFFERENTIAL/PLATELET
Abs Immature Granulocytes: 0.03 10*3/uL (ref 0.00–0.07)
Basophils Absolute: 0.1 10*3/uL (ref 0.0–0.1)
Basophils Relative: 1 %
Eosinophils Absolute: 0.2 10*3/uL (ref 0.0–0.5)
Eosinophils Relative: 2 %
HCT: 28.3 % — ABNORMAL LOW (ref 36.0–46.0)
Hemoglobin: 8.6 g/dL — ABNORMAL LOW (ref 12.0–15.0)
Immature Granulocytes: 0 %
Lymphocytes Relative: 10 %
Lymphs Abs: 1 10*3/uL (ref 0.7–4.0)
MCH: 27.7 pg (ref 26.0–34.0)
MCHC: 30.4 g/dL (ref 30.0–36.0)
MCV: 91 fL (ref 80.0–100.0)
Monocytes Absolute: 0.5 10*3/uL (ref 0.1–1.0)
Monocytes Relative: 5 %
Neutro Abs: 8.5 10*3/uL — ABNORMAL HIGH (ref 1.7–7.7)
Neutrophils Relative %: 82 %
Platelets: 228 10*3/uL (ref 150–400)
RBC: 3.11 MIL/uL — ABNORMAL LOW (ref 3.87–5.11)
RDW: 15.8 % — ABNORMAL HIGH (ref 11.5–15.5)
WBC: 10.3 10*3/uL (ref 4.0–10.5)
nRBC: 0 % (ref 0.0–0.2)

## 2019-08-09 LAB — COMPREHENSIVE METABOLIC PANEL
ALT: 12 U/L (ref 0–44)
AST: 15 U/L (ref 15–41)
Albumin: 2.6 g/dL — ABNORMAL LOW (ref 3.5–5.0)
Alkaline Phosphatase: 58 U/L (ref 38–126)
Anion gap: 9 (ref 5–15)
BUN: 8 mg/dL (ref 8–23)
CO2: 24 mmol/L (ref 22–32)
Calcium: 8.3 mg/dL — ABNORMAL LOW (ref 8.9–10.3)
Chloride: 107 mmol/L (ref 98–111)
Creatinine, Ser: 0.7 mg/dL (ref 0.44–1.00)
GFR calc Af Amer: >60 mL/min (ref >60)
GFR calc non Af Amer: >60 mL/min (ref >60)
Glucose, Bld: 105 mg/dL — ABNORMAL HIGH (ref 70–99)
Potassium: 3.6 mmol/L (ref 3.5–5.1)
Sodium: 140 mmol/L (ref 135–145)
Total Bilirubin: 0.9 mg/dL (ref 0.3–1.2)
Total Protein: 5.5 g/dL — ABNORMAL LOW (ref 6.5–8.1)

## 2019-08-09 LAB — URINE CULTURE

## 2019-08-09 LAB — MAGNESIUM: Magnesium: 1.8 mg/dL (ref 1.7–2.4)

## 2019-08-09 LAB — LEGIONELLA PNEUMOPHILA SEROGP 1 UR AG: L. pneumophila Serogp 1 Ur Ag: NEGATIVE

## 2019-08-09 NOTE — Consult Note (Addendum)
[facility] Consult: 11:27 AM 08/09/2019  LOS: 2 days    Referring Provider: Dr. Hughie Closs Primary Care Physician:  Shirline Frees, NP Primary Gastroenterologist:  Dr. Yancey Flemings, MD    Reason for Consultation: Dysphagia   HPI: Julie Rice is a 83 y.o. female.  Hx hypertension.  Hyperlipidemia.  Systolic CHF.  Fibromyalgia.  Chronic pain.  Anemia.  COPD oxygen dependent.  CAD.  TIA.  Bipolar disorder.  GI issues include esophageal strictures.  Nissen fundoplication for repair of large symptomatic HH but patient also has dysphasia due to dysmotility Numerous upper endoscopies with dilations of distal esophageal strictures dating back to 2002.  03/2018 esophageal manometry was a suboptimal study but revealed aperistalsis with 100% failed swallows.  Dr Lavon Paganini unable to determine LES relaxation.  Differential includes achalasia versus scleroderma esophagus vs E GJ outflow obstruction from Nissen.  May need to consider fundoplication reversal or modification. 04/2018 CT showed increased size of moderate hiatal hernia Latest EGD 09/21/2018.  For dysphagia.  Dr. [name] injected Botox and balloon dilated large caliber distal esophageal stricture.  There was a persistent small HH following previous HH repair.  She did well for about 3 months but after 4 months was having recurrent cough and intermittent regurgitation despite Protonix bid and prn antacids.  She regurgitate liquids at times and avoids meats and other significant solid food.  Regurgitation material intermittently coffee ground positive. 07/07/2019 office visit with Dr. [name] where she reported recent CGE.  Dr. [name] planned repeat EGD and Botox injection for 08/17/2019 but her sister went ahead and canceled appointment thinking it was this Tuesday rather  than next Tuesday..  Now admitted over the weekend with aspiration pneumonia, resenting with shortness of breath and chest pain.  Chest imaging revealed multi focal RUL pneumonia.  COVID-19 negative.  Hgb 9.8 >> 8.8 (9.2 on 9/21).  Cardiac enzymes negative, nonischemic EKG Bedside SLP swallowing evaluation shows no overt S/S of oropharyngeal dysphagia with limited p.o. intake.  Breathing is better today than it was at admission.    Past Medical History:  Diagnosis Date   Anemia    Anxiety    Bipolar disorder (HCC)    Blood transfusion 1991   autologous pts own blood given    CAD (coronary artery disease)    Cataract    Chest pain    "@ rest, lying down, w/exertion"   Chronic back pain    "mostly lower back but I do have upper back pain regularly" (04/06/2018)   COPD (chronic obstructive pulmonary disease) (HCC)    Depression    Esophageal stricture    Fibromyalgia    GERD (gastroesophageal reflux disease)    Heart murmur    "slight" (04/06/2018)   Hiatal hernia    Hyperlipidemia    Patient denies   Hypertension    Internal hemorrhoids    Lumbago    Mitral regurgitation    Osteoarthritis    Osteoporosis    Pneumonia ~ 01/2018   PONV (postoperative nausea and vomiting)    severe ponv, "in the  past" (04/06/2018)   Rectal bleeding    Rheumatoid arthritis ([place])    Spondylosis    TIA (transient ischemic attack) 2013   Urinary incontinence    wears depends    Past Surgical History:  Procedure Laterality Date   ABDOMINAL HYSTERECTOMY  1982   BACK SURGERY     BALLOON DILATION N/A 09/21/2018   Procedure: BALLOON DILATION;  Surgeon: Irene Shipper, MD;  Location: [facility];  Service: Endoscopy;  Laterality: N/A;   BLADDER SUSPENSION  1980's   BOTOX INJECTION N/A 09/21/2018   Procedure: BOTOX INJECTION;  Surgeon: Irene Shipper, MD;  Location: [facility];  Service: Endoscopy;  Laterality: N/A;   CATARACT EXTRACTION W/ INTRAOCULAR LENS   IMPLANT, BILATERAL Bilateral 2010   DILATION AND CURETTAGE OF UTERUS  1961   ESOPHAGEAL MANOMETRY N/A 03/25/2018   Procedure: ESOPHAGEAL MANOMETRY (EM);  Surgeon: Mauri Pole, MD;  Location: [facility];  Service: Endoscopy;  Laterality: N/A;   ESOPHAGOGASTRODUODENOSCOPY (EGD) WITH PROPOFOL N/A 04/07/2018   Procedure: ESOPHAGOGASTRODUODENOSCOPY (EGD) WITH PROPOFOL;  Surgeon: Doran Stabler, MD;  Location: [facility];  Service: Gastroenterology;  Laterality: N/A;   ESOPHAGOGASTRODUODENOSCOPY (EGD) WITH PROPOFOL N/A 09/21/2018   Procedure: ESOPHAGOGASTRODUODENOSCOPY (EGD) WITH PROPOFOL;  Surgeon: Irene Shipper, MD;  Location: [facility];  Service: Endoscopy;  Laterality: N/A;   HERNIA REPAIR     HIATAL HERNIA REPAIR N/A 08/02/2016   Procedure: LAPAROSCOPIC REPAIR OF LARGE  HIATAL HERNIA;  Surgeon: Excell Seltzer, MD;  Location: [facility];  Service: General;  Laterality: N/A;   JOINT REPLACEMENT     LAPAROSCOPIC NISSEN FUNDOPLICATION N/A 0/34/7425   Procedure: LAPAROSCOPIC NISSEN FUNDOPLICATION;  Surgeon: Excell Seltzer, MD;  Location: [facility];  Service: General;  Laterality: N/A;   [place]; 1994; [id number]   "I've got 2 stainless steel rods; 6 screws; 2 ray cages"took bone from right hip to put in back   RIGHT/LEFT HEART CATH AND CORONARY ANGIOGRAPHY N/A 07/26/2019   Procedure: RIGHT/LEFT HEART CATH AND CORONARY ANGIOGRAPHY;  Surgeon: Belva Crome, MD;  Location: [facility];  Service: Cardiovascular;  Laterality: N/A;   TONSILLECTOMY AND ADENOIDECTOMY  1945   TOTAL KNEE ARTHROPLASTY Right ~ [place]  ~ 1976    Prior to Admission medications   Medication Sig Start Date End Date Taking? Authorizing Provider  acetaminophen (TYLENOL) 325 MG tablet Take 2 tablets (650 mg total) by mouth every 4 (four) hours as needed for headache or mild pain. 07/27/19   Allie Bossier, MD  albuterol (VENTOLIN HFA) 108 (90 Base) MCG/ACT inhaler  Inhale 2 puffs into the lungs every 6 (six) hours as needed for wheezing or shortness of breath. 02/23/19   [name], Tammy S, NP  amLODipine (NORVASC) 2.5 MG tablet Take 2.5 mg by mouth daily.    [provider]  aspirin EC 81 MG EC tablet Take 1 tablet (81 mg total) by mouth daily. 07/28/19   Allie Bossier, MD  carvedilol (COREG) 3.125 MG tablet TAKE 1 TABLET BY MOUTH TWICE A DAY WITH A MEAL 07/30/19   Nafziger, Tommi Rumps, NP  clonazePAM (KLONOPIN) 0.5 MG tablet TAKE 1 TABLET BY MOUTH TWICE A DAY AS NEEDED FOR ANXIETY 05/12/19   Nafziger, Tommi Rumps, NP  ferrous sulfate 325 (65 FE) MG tablet Take 1 tablet (325 mg total) by mouth daily with breakfast. 07/28/19   Allie Bossier, MD  gabapentin (NEURONTIN) 100 MG capsule TAKE 1 CAPSULE BY MOUTH THREE TIMES  A DAY 07/13/19   Nafziger, [name], NP  meloxicam (MOBIC) 15 MG tablet Take 1 tablet (15 mg total) by mouth daily. 11/05/18   Nafziger, [name], NP  Multiple Vitamin (MULTIVITAMIN) tablet Take 1 tablet by mouth daily. Vitamin supplement. 02/07/12   Viviann Spare, FNP  pantoprazole (PROTONIX) 40 MG tablet Take 1 tablet (40 mg total) by mouth 2 (two) times daily. 08/31/18   Tressia Danas, MD  sacubitril-valsartan (ENTRESTO) 24-26 MG Take 1 tablet by mouth 2 (two) times daily. 07/27/19   Drema Dallas, MD  sertraline (ZOLOFT) 100 MG tablet TAKE 1 TABLET BY MOUTH EVERYDAY AT BEDTIME 07/16/19   Nafziger, [name], NP  spironolactone (ALDACTONE) 25 MG tablet Take 0.5 tablets (12.5 mg total) by mouth daily. 07/28/19   Drema Dallas, MD  sucralfate (CARAFATE) 1 g tablet Take 1 tablet (1 g total) by mouth 4 (four) times daily. Dissolve tablet in 10 ml warm water Patient taking differently: Take 1 g by mouth 4 (four) times daily as needed (heartburn). Dissolve tablet in 10 ml warm water 08/31/18   Tressia Danas, MD  tetrahydrozoline 0.05 % ophthalmic solution Place 1 drop into both eyes daily.    [provider]    Scheduled Meds:  amLODipine  2.5 mg Oral  Daily   aspirin EC  81 mg Oral Daily   carvedilol  3.125 mg Oral BID WC   gabapentin  100 mg Oral TID   guaiFENesin  600 mg Oral BID   sacubitril-valsartan  1 tablet Oral BID   sertraline  100 mg Oral QHS   sodium chloride flush  3 mL Intravenous Q12H   spironolactone  12.5 mg Oral Daily   Infusions:  doxycycline (VIBRAMYCIN) IV 100 mg (08/09/19 0406)   piperacillin-tazobactam 3.375 g (08/09/19 0548)   PRN Meds: acetaminophen **OR** acetaminophen, albuterol, clonazePAM, fentaNYL (SUBLIMAZE) injection, LORazepam, naphazoline-glycerin, sucralfate   Allergies as of 08/07/2019 - Review Complete 08/07/2019  Allergen Reaction Noted   Morphine Other (See Comments) 08/01/2008   Tape Other (See Comments) 02/22/2016    Family History  Problem Relation Age of Onset   Kidney disease Mother    Hypertension Mother    Heart disease Father 42       die of MI at age 1   Suicidality Son    Heart disease Brother    Heart attack Brother    Colon cancer Neg Hx    Esophageal cancer Neg Hx    Pancreatic cancer Neg Hx    Rectal cancer Neg Hx    Stomach cancer Neg Hx     Social History   Socioeconomic History   Marital status: Widowed    Spouse name: Not on file   Number of children: 2   Years of education: Not on file   Highest education level: Not on file  Occupational History    Employer: RETIRED  Social Needs   Financial resource strain: Not on file   Food insecurity    Worry: Not on file    Inability: Not on file   Transportation needs    Medical: Not on file    Non-medical: Not on file  Tobacco Use   Smoking status: Never Smoker   Smokeless tobacco: Never Used  Substance and Sexual Activity   Alcohol use: Never    Frequency: Never   Drug use: Never   Sexual activity: Not Currently  Lifestyle   Physical activity    Days per week: Not on file  Minutes per session: Not on file   Stress: Not on file  Relationships   Social  connections    Talks on phone: Not on file    Gets together: Not on file    Attends religious service: Not on file    Active member of club or organization: Not on file    Attends meetings of clubs or organizations: Not on file    Relationship status: Not on file   Intimate partner violence    Fear of current or ex partner: Not on file    Emotionally abused: Not on file    Physically abused: Not on file    Forced sexual activity: Not on file  Other Topics Concern   Not on file  Social History Narrative   Retired    Widowed    REVIEW OF SYSTEMS: Constitutional: Some weakness, no profound fatigue ENT:  No nose bleeds Pulm: Cough has improved.  Does not feel short of breath at rest but got short of breath this morning when she walked to the bathroom. CV:  No palpitations, no LE edema.  No angina GU:  No hematuria, no frequency GI: See HPI.  Stools are brown. Heme: No excessive or unusual bleeding/bruising. Transfusions: None. Neuro: Some dizziness has improved. Derm:  No itching, no rash or sores.  Endocrine:  No sweats or chills.  No polyuria or dysuria Immunization: Reviewed.  She got her flu shot on 07/24/2019. Travel:  None beyond local counties in last few months.    PHYSICAL EXAM: Vital signs in last 24 hours: Vitals:   08/08/19 2230 08/09/19 0826  BP: (!) 145/64 (!) 147/68  Pulse: 84 78  Resp: 16 19  Temp: (!) 97.5 F (36.4 C) 97.6 F (36.4 C)  SpO2: 99% 99%   Wt Readings from Last 3 Encounters:  08/09/19 30.2 kg  07/27/19 63.7 kg  07/07/19 65.3 kg    General: Pleasant, comfortable WF.  Somewhat frail.  Not toxic or acutely ill-appearing Head: No facial asymmetry or swelling.  No signs of head trauma. Eyes: No conjunctival pallor.  No scleral icterus.  EOMI. Ears: Not HOH Nose: No discharge or congestion Mouth: Oral mucosa moist, pink, clear.  Good dentition.  Tongue midline. Neck: No JVD, masses, thyromegaly. Lungs: Clear bilaterally.  No labored  breathing.  There is clear, somewhat frothy sputum in the emesis bag at the bedside. Heart: RRR.  No MRG. Abdomen: Soft.  Not tender.  Not distended.  Active bowel sounds.  No HSM, bruits, masses, hernias..   Rectal: Deferred Musc/Skeltl: No joint redness or swelling.  No gross joint deformities. Extremities: No CCE Neurologic: Oriented x3.  Fully alert.  Moves all 4 limbs, strength not tested.  No tremor. Skin: No rash, no suspicious lesions or sores. Nodes: No cervical adenopathy. Psych: Cooperative, pleasant, calm.  Intake/Output from previous day: 10/04 0701 - 10/05 0700 In: -  Out: 1100 [Urine:1100] Intake/Output this shift: No intake/output data recorded.  LAB RESULTS: Recent Labs    08/07/19 0710  08/07/19 1800 08/08/19 0643 08/09/19 0732  WBC 10.5  --   --  12.2* 10.3  HGB 9.8*   < > 9.3* 8.1* 8.6*  HCT 33.3*   < > 29.4* 26.0* 28.3*  PLT 244  --   --  168 228   < > = values in this interval not displayed.   BMET Lab Results  Component Value Date   NA 140 08/09/2019   NA 140 08/08/2019   NA 142  08/07/2019   K 3.6 08/09/2019   K 3.5 08/08/2019   K 3.1 (L) 08/07/2019   CL 107 08/09/2019   CL 108 08/08/2019   CL 103 08/07/2019   CO2 24 08/09/2019   CO2 25 08/08/2019   CO2 27 08/07/2019   GLUCOSE 105 (H) 08/09/2019   GLUCOSE 94 08/08/2019   GLUCOSE 158 (H) 08/07/2019   BUN 8 08/09/2019   BUN 16 08/08/2019   BUN 14 08/07/2019   CREATININE 0.70 08/09/2019   CREATININE 0.78 08/08/2019   CREATININE 0.89 08/07/2019   CALCIUM 8.3 (L) 08/09/2019   CALCIUM 8.0 (L) 08/08/2019   CALCIUM 9.3 08/07/2019   LFT Recent Labs    08/09/19 0732  PROT 5.5*  ALBUMIN 2.6*  AST 15  ALT 12  ALKPHOS 58  BILITOT 0.9   PT/INR Lab Results  Component Value Date   INR 0.9 02/06/2009   Hepatitis Panel No results for input(s): HEPBSAG, HCVAB, HEPAIGM, HEPBIGM in the last 72 hours. C-Diff No components found for: CDIFF Lipase     Component Value Date/Time   LIPASE  25 04/05/2018 1552    Drugs of Abuse     Component Value Date/Time   LABOPIA NONE DETECTED 02/05/2012 0058   COCAINSCRNUR NONE DETECTED 02/05/2012 0058   LABBENZ POSITIVE (A) 02/05/2012 0058   AMPHETMU NONE DETECTED 02/05/2012 0058   THCU NONE DETECTED 02/05/2012 0058   LABBARB NONE DETECTED 02/05/2012 0058     RADIOLOGY STUDIES: No results found.    IMPRESSION:   *    Dysphagia with recurrent regurgitation and now aspiration pneumonia in pt who s/p Nissen fundoplication 07/2016 but has subsequently developed recurrent dysphagia and esophageal dysmotility, esophageal aperistalsis.  Improved for a few months following balloon dilatation and Botox injection 09/2018.  Set up for 10/13 repeat EGD with dilatation/Botox but now canceled.  *     Normocytic anemia.  Chronic.  *     Aspiration pneumonia.  On doxycycline, Zosyn   PLAN:     *     Dr. Chales AbrahamsGupta is rounding in the hospital this week and will see the patient.  He will make decision regarding proceeding with EGD as an inpatient.  It is probably not too late to reschedule this with Dr. Marina GoodellPerry for the 13th.      Jennye MoccasinSarah Gribbin  08/09/2019, 11:27 AM Phone [phone]      Attending physician's note   I have taken an interval history, reviewed the chart and examined the patient. I agree with the Advanced Practitioner's note, impression and recommendations.   Adm d/t aspiration pneumonia on Zosyn/doxy. Eso dysphagia with regurg s/p Nissen's fundoplication 07/2016 with subsequent esophageal dysmotility/esophageal stricture s/p balloon dil/ Botox injection 09/2018.  Was scheduled for repeat EGD with dil/botox 10/13.  Plan: -Continue antibiotics. -Barium swallow in AM. -Then once stable from pulmonary standpoint, EGD with dil/botox if needed near discharge. -Appreciate speech consultation input.  Edman Circleaj Federick Levene, MD [facility] [phone].

## 2019-08-09 NOTE — Telephone Encounter (Signed)
Appt cancelled as requested.

## 2019-08-09 NOTE — Progress Notes (Signed)
  Speech Language Pathology Treatment: Dysphagia  Patient Details Name: Julie Rice MRN: [id number] DOB: 09/11/1936 Today's Date: 08/09/2019 Time: 3545-6256 SLP Time Calculation (min) (ACUTE ONLY): 13 min  Assessment / Plan / Recommendation Clinical Impression  F/u after yesterday's clinical swallow - pt remains on clear liquid diet.  Continues to have persisting N/V per her report.  Per notes, may have GI consult while here.  Identifies globus as near constant.  There are no overt s/s of an oropharyngeal dysphagia with limited intake, however pt is self-limiting given N/V.  Hx of aspiration pna likely related to post-prandial or retrograde aspiration given esophageal deficits.  SLP will follow in the periphery and f/u once pt is ready to advance diet and to determine the benefit of instrumental swallow study.  D/W pt.    HPI HPI: Julie Rice is a 83 y.o. female with medical history significant of hypertension, hyperlipidemia, systolic CHF, fibromyalgia with chronic pain, anemia, esophageal stricture, large symptomatic hiatal hernia status post fundoplication, post fundoplication dysphagia secondary to dysmotility (primary versus secondary with technically limited esophageal manometry), asthma/COPD, oxygen dependent on 2 L, CAD, TIA, and bipolar disorder; who presented to ED on 08/07/2019 with complaints of shortness of breath and chest pain starting morning around 3-4 AM.  Chest x-ray significant for extensive airspace disease in left upper and lower lobe, and to a lesser extent right lower lobe concerning for multilobar pneumonia.  COVID-19 screening was negative.      SLP Plan  Continue with current plan of care       Recommendations  Diet recommendations: Thin liquid Medication Administration: Crushed with puree Compensations: Slow rate;Small sips/bites Postural Changes and/or Swallow Maneuvers: Seated upright 90 degrees;Upright 30-60 min after meal                Oral Care  Recommendations: Oral care BID SLP Visit Diagnosis: Dysphagia, unspecified (R13.10) Plan: Continue with current plan of care       GO              Julie Rice L. Tivis Ringer, [place] Office number [phone] Pager [phone]   Julie Rice Julie Rice 08/09/2019, 9:44 AM

## 2019-08-09 NOTE — Progress Notes (Signed)
PROGRESS NOTE    CHERELYN HARBOLD  CZY:[id number] DOB: 09-06-1936 DOA: 08/07/2019 PCP: Shirline Frees, NP   Brief Narrative:  Julie Rice is a 83 y.o. female with medical history significant of hypertension, hyperlipidemia, systolic CHF, fibromyalgia with chronic pain, anemia, esophageal stricture, large symptomatic hiatal hernia status post fundoplication, post fundoplication dysphagia secondary to dysmotility (primary versus secondary with technically limited esophageal manometry), asthma/COPD, oxygen dependent on 2 L, CAD, TIA, and bipolar disorder; who presented to ED on 08/07/2019 with complaints of shortness of breath and chest pain starting morning around 3-4 AM.  Symptoms woke her out of her sleep.  Chest pain was radiating to the left side of the back.  She also had associated diaphoresis, fever, malaise, nausea and cough with brownish sputum and vomiting. On EMS arrival they had to break down the door to get inside her home and found her to be hypoxic down to 70% on 3 L.  She was given with sublingual nitroglycerin and placed on CPAP with improvement of symptoms. Upon arrival to the ED, blood pressure was 100 F, slightly tachycardic and tachypneic with normal blood pressure.  Patient initially arrived on CPAP but was switched to nonrebreather mask and was then weaned down soon to 3 L of nasal collar.  Her lactic acid was 3.3.  Margins were 10.5. hemoglobin 9.8, BNP 748.8, troponin 18, and lactic acid 3.3.  ABG revealed pH of 7.423, PCO2 39.8, and PO2 65.  Chest x-ray significant for extensive airspace disease in left upper and lower lobe, and to a lesser extent right lower lobe concerning for multilobar pneumonia.  COVID-19 screening was negative.  Blood cultures were obtained.  Patient was given 1 L normal saline IV fluids, nitroglycerin paste, Rocephin, and azithromycin.  TRH called to admit for suspected pneumonia.  She was admitted with acute on chronic hypoxic respiratory failure due to  pneumonia and was continued on Rocephin and doxycycline.   Assessment & Plan:   Active Problems:   Hypokalemia   Chest pain   Multifocal pneumonia   Prolonged QT interval   Sepsis (HCC)   Acute on chronic respiratory failure with hypoxia (HCC)  Sepsis and acute on chronic hypoxic respiratory failure secondary to recurrent aspiration pneumonia: Likely secondary to persistent esophageal stricture.  She continues to have nausea and vomiting.  This needs to be treated.  She was scheduled to have EGD tomorrow with Dr. [name] as an outpatient.  I have consulted [facility].  Procalcitonin was 7.7.  Will repeat procalcitonin tomorrow.  Urine antigens negative.  COVID-19 negative.  She remains on 3 L of nasal oxygen.  Continue Zosyn and doxycycline.  Chest pain/ elevated troponin: Apparently she did complain of having chest pain at the time of admission.  No more chest pain since then. Her troponin initially was 18 and repeat was 76.  Normal troponins. EKG did not have any ST-T wave changes.  This could very well be due to demand ischemia.  Monitor on telemetry.  History of esophageal stricture status post dilatations multiple times/esophageal dysmotility: Persistent nausea and vomiting.  Consulted GI to consider EGD and dilatation here.    Hypokalemia: Resolved.  Chronic systolic congestive heart failure: Euvolemic.  Continue carvedilol, Entresto and Aldactone.  Essential hypertension: Controlled.  Continue current regimen.  Chronic pain/fibromyalgia: PR Tylenol and Toradol.  Bipolar disorder/anxiety: Continue Zoloft and clonazepam.  DVT prophylaxis: Lovenox Code Status: Full code Family Communication:  None present at bedside.  Plan of care discussed with patient in  length and she verbalized understanding and agreed with it. Disposition Plan: TBD  Consultants:   None  Procedures:   None  Antimicrobials:   IV Zosyn and doxycycline   Subjective: Patient seen and examined.  She  states that she does not feel well.  Had some nausea and vomiting this morning.  She just received Zofran.  No other complaint.  Objective: Vitals:   08/08/19 1716 08/08/19 2230 08/09/19 0328 08/09/19 0826  BP: 133/79 (!) 145/64  (!) 147/68  Pulse: 75 84  78  Resp: 14 16  19   Temp: 97.8 F (36.6 C) (!) 97.5 F (36.4 C)  97.6 F (36.4 C)  TempSrc: Oral Oral  Oral  SpO2: 99% 99%  99%  Weight:   30.2 kg   Height:        Intake/Output Summary (Last 24 hours) at 08/09/2019 1115 Last data filed at 08/09/2019 0500 Gross per 24 hour  Intake --  Output 1100 ml  Net -1100 ml   Filed Weights   08/07/19 0613 08/09/19 0328  Weight: 63.7 kg 30.2 kg    Examination:  General exam: Appears calm and comfortable  Respiratory system: Coarse breath sounds with rhonchi in the left middle and lower lobe and right lower lobe. Respiratory effort normal. Cardiovascular system: S1 & S2 heard, RRR. No JVD, murmurs, rubs, gallops or clicks. No pedal edema. Gastrointestinal system: Abdomen is nondistended, soft and nontender. No organomegaly or masses felt. Normal bowel sounds heard. Central nervous system: Alert and oriented. No focal neurological deficits. Extremities: Symmetric 5 x 5 power. Skin: No rashes, lesions or ulcers.  Psychiatry: Judgement and insight appear normal. Mood & affect appropriate.    Data Reviewed: I have personally reviewed following labs and imaging studies  CBC: Recent Labs  Lab 08/07/19 0710 08/07/19 0919 08/07/19 1800 08/08/19 0643 08/09/19 0732  WBC 10.5  --   --  12.2* 10.3  NEUTROABS 9.6*  --   --   --  8.5*  HGB 9.8* 8.8* 9.3* 8.1* 8.6*  HCT 33.3* 26.0* 29.4* 26.0* 28.3*  MCV 94.3  --   --  91.2 91.0  PLT 244  --   --  168 228   Basic Metabolic Panel: Recent Labs  Lab 08/07/19 0710 08/07/19 0919 08/08/19 0643 08/09/19 0732  NA 141 142 140 140  K 3.5 3.1* 3.5 3.6  CL 103  --  108 107  CO2 27  --  25 24  GLUCOSE 158*  --  94 105*  BUN 14  --  16 8   CREATININE 0.89  --  0.78 0.70  CALCIUM 9.3  --  8.0* 8.3*  MG  --   --   --  1.8   GFR: Estimated Creatinine Clearance: 26.3 mL/min (by C-G formula based on SCr of 0.7 mg/dL). Liver Function Tests: Recent Labs  Lab 08/09/19 0732  AST 15  ALT 12  ALKPHOS 58  BILITOT 0.9  PROT 5.5*  ALBUMIN 2.6*   No results for input(s): LIPASE, AMYLASE in the last 168 hours. No results for input(s): AMMONIA in the last 168 hours. Coagulation Profile: No results for input(s): INR, PROTIME in the last 168 hours. Cardiac Enzymes: No results for input(s): CKTOTAL, CKMB, CKMBINDEX, TROPONINI in the last 168 hours. BNP (last 3 results) Recent Labs    01/12/19 1427  PROBNP 196.0*   HbA1C: No results for input(s): HGBA1C in the last 72 hours. CBG: No results for input(s): GLUCAP in the last 168 hours. Lipid  Profile: No results for input(s): CHOL, HDL, LDLCALC, TRIG, CHOLHDL, LDLDIRECT in the last 72 hours. Thyroid Function Tests: No results for input(s): TSH, T4TOTAL, FREET4, T3FREE, THYROIDAB in the last 72 hours. Anemia Panel: No results for input(s): VITAMINB12, FOLATE, FERRITIN, TIBC, IRON, RETICCTPCT in the last 72 hours. Sepsis Labs: Recent Labs  Lab 08/07/19 0709 08/07/19 1020 08/07/19 1130 08/07/19 1844 08/08/19 0643 08/08/19 0853  PROCALCITON  --  2.80  --   --  7.70  --   LATICACIDVEN 3.3*  --  0.9 2.0*  --  1.0    Recent Results (from the past 240 hour(s))  SARS Coronavirus 2 [facility] order, Performed in [facility] hospital lab) Nasopharyngeal Nasopharyngeal Swab     Status: None   Collection Time: 08/07/19  6:10 AM   Specimen: Nasopharyngeal Swab  Result Value Ref Range Status   SARS Coronavirus 2 NEGATIVE NEGATIVE Final    Comment: (NOTE) If result is NEGATIVE SARS-CoV-2 target nucleic acids are NOT DETECTED. The SARS-CoV-2 RNA is generally detectable in upper and lower  respiratory specimens during the acute phase of infection. The lowest  concentration of  SARS-CoV-2 viral copies this assay can detect is 250  copies / mL. A negative result does not preclude SARS-CoV-2 infection  and should not be used as the sole basis for treatment or other  patient management decisions.  A negative result may occur with  improper specimen collection / handling, submission of specimen other  than nasopharyngeal swab, presence of viral mutation(s) within the  areas targeted by this assay, and inadequate number of viral copies  (<250 copies / mL). A negative result must be combined with clinical  observations, patient history, and epidemiological information. If result is POSITIVE SARS-CoV-2 target nucleic acids are DETECTED. The SARS-CoV-2 RNA is generally detectable in upper and lower  respiratory specimens dur ing the acute phase of infection.  Positive  results are indicative of active infection with SARS-CoV-2.  Clinical  correlation with patient history and other diagnostic information is  necessary to determine patient infection status.  Positive results do  not rule out bacterial infection or co-infection with other viruses. If result is PRESUMPTIVE POSTIVE SARS-CoV-2 nucleic acids MAY BE PRESENT.   A presumptive positive result was obtained on the submitted specimen  and confirmed on repeat testing.  While 2019 novel coronavirus  (SARS-CoV-2) nucleic acids may be present in the submitted sample  additional confirmatory testing may be necessary for epidemiological  and / or clinical management purposes  to differentiate between  SARS-CoV-2 and other Sarbecovirus currently known to infect humans.  If clinically indicated additional testing with an alternate test  methodology [phone]) is advised. The SARS-CoV-2 RNA is generally  detectable in upper and lower respiratory sp ecimens during the acute  phase of infection. The expected result is Negative. Fact Sheet for Patients:  StrictlyIdeas.no Fact Sheet for Healthcare  Providers: BankingDealers.co.za This test is not yet approved or cleared by the [place] FDA and has been authorized for detection and/or diagnosis of SARS-CoV-2 by FDA under an Emergency Use Authorization (EUA).  This EUA will remain in effect (meaning this test can be used) for the duration of the COVID-19 declaration under Section 564(b)(1) of the Act, 21 U.S.C. section 360bbb-3(b)(1), unless the authorization is terminated or revoked sooner. Performed at [facility], [place] [address] [place], [place] [address]   Culture, blood (Routine X 2) w Reflex to ID Panel     Status: None (Preliminary result)  Collection Time: 08/07/19  7:40 AM   Specimen: BLOOD LEFT WRIST  Result Value Ref Range Status   Specimen Description BLOOD LEFT WRIST  Final   Special Requests   Final    BOTTLES DRAWN AEROBIC AND ANAEROBIC Blood Culture results may not be optimal due to an inadequate volume of blood received in culture bottles   Culture   Final    NO GROWTH 2 DAYS Performed at [facility], [address], [place], [place] [address]    Report Status PENDING  Incomplete  Culture, blood (Routine X 2) w Reflex to ID Panel     Status: None (Preliminary result)   Collection Time: 08/07/19  8:23 AM   Specimen: BLOOD RIGHT ARM  Result Value Ref Range Status   Specimen Description BLOOD RIGHT ARM  Final   Special Requests   Final    BOTTLES DRAWN AEROBIC AND ANAEROBIC Blood Culture adequate volume   Culture   Final    NO GROWTH 2 DAYS Performed at [facility], [address], [place], [place] [address]    Report Status PENDING  Incomplete  Urine culture     Status: None   Collection Time: 08/07/19  4:00 PM   Specimen: In/Out Cath Urine  Result Value Ref Range Status   Specimen Description IN/OUT CATH URINE  Final   Special Requests   Final    ADDED 0533 08/08/2019 Performed at [facility], [address] [place], [place] [address]    Culture    Final    Multiple bacterial morphotypes present, none predominant. Suggest appropriate recollection if clinically indicated.   Report Status 08/09/2019 FINAL  Final  Culture, sputum-assessment     Status: None   Collection Time: 08/07/19  6:06 PM   Specimen: Sputum  Result Value Ref Range Status   Specimen Description SPUTUM  Final   Special Requests NONE  Final   Sputum evaluation   Final    Sputum specimen not acceptable for testing.  Please recollect.   RESULT CALLED TO, READ BACK BY AND VERIFIED WITH: RN KRISTY H. 1642 [id number] FCP Performed at [facility], [address], [place], [place] [address]    Report Status 08/08/2019 FINAL  Final      Radiology Studies: No results found.  Scheduled Meds:  amLODipine  2.5 mg Oral Daily   aspirin EC  81 mg Oral Daily   carvedilol  3.125 mg Oral BID WC   gabapentin  100 mg Oral TID   guaiFENesin  600 mg Oral BID   sacubitril-valsartan  1 tablet Oral BID   sertraline  100 mg Oral QHS   sodium chloride flush  3 mL Intravenous Q12H   spironolactone  12.5 mg Oral Daily   Continuous Infusions:  doxycycline (VIBRAMYCIN) IV 100 mg (08/09/19 0406)   piperacillin-tazobactam 3.375 g (08/09/19 0548)     LOS: 2 days   Time spent: 32 minutes minutes   Hughie Closs, MD [facility]  08/09/2019, 11:15 AM   To contact the attending provider between 7A-7P or the covering provider during after hours 7P-7A, please log into the web site [email or web address] and use password [email or web address].

## 2019-08-10 ENCOUNTER — Other Ambulatory Visit: Payer: Self-pay

## 2019-08-10 ENCOUNTER — Inpatient Hospital Stay (HOSPITAL_COMMUNITY): Payer: Medicare Other

## 2019-08-10 LAB — CBC WITH DIFFERENTIAL/PLATELET
Abs Immature Granulocytes: 0.03 10*3/uL (ref 0.00–0.07)
Basophils Absolute: 0.1 10*3/uL (ref 0.0–0.1)
Basophils Relative: 1 %
Eosinophils Absolute: 0.3 10*3/uL (ref 0.0–0.5)
Eosinophils Relative: 3 %
HCT: 28.5 % — ABNORMAL LOW (ref 36.0–46.0)
Hemoglobin: 8.9 g/dL — ABNORMAL LOW (ref 12.0–15.0)
Immature Granulocytes: 0 %
Lymphocytes Relative: 13 %
Lymphs Abs: 1.2 10*3/uL (ref 0.7–4.0)
MCH: 28 pg (ref 26.0–34.0)
MCHC: 31.2 g/dL (ref 30.0–36.0)
MCV: 89.6 fL (ref 80.0–100.0)
Monocytes Absolute: 0.5 10*3/uL (ref 0.1–1.0)
Monocytes Relative: 6 %
Neutro Abs: 7.2 10*3/uL (ref 1.7–7.7)
Neutrophils Relative %: 77 %
Platelets: 258 10*3/uL (ref 150–400)
RBC: 3.18 MIL/uL — ABNORMAL LOW (ref 3.87–5.11)
RDW: 15 % (ref 11.5–15.5)
WBC: 9.3 10*3/uL (ref 4.0–10.5)
nRBC: 0 % (ref 0.0–0.2)

## 2019-08-10 LAB — COMPREHENSIVE METABOLIC PANEL
ALT: 13 U/L (ref 0–44)
AST: 14 U/L — ABNORMAL LOW (ref 15–41)
Albumin: 2.4 g/dL — ABNORMAL LOW (ref 3.5–5.0)
Alkaline Phosphatase: 60 U/L (ref 38–126)
Anion gap: 8 (ref 5–15)
BUN: 5 mg/dL — ABNORMAL LOW (ref 8–23)
CO2: 26 mmol/L (ref 22–32)
Calcium: 8.4 mg/dL — ABNORMAL LOW (ref 8.9–10.3)
Chloride: 108 mmol/L (ref 98–111)
Creatinine, Ser: 0.67 mg/dL (ref 0.44–1.00)
GFR calc Af Amer: >60 mL/min (ref >60)
GFR calc non Af Amer: >60 mL/min (ref >60)
Glucose, Bld: 94 mg/dL (ref 70–99)
Potassium: 3.8 mmol/L (ref 3.5–5.1)
Sodium: 142 mmol/L (ref 135–145)
Total Bilirubin: 0.5 mg/dL (ref 0.3–1.2)
Total Protein: 5.2 g/dL — ABNORMAL LOW (ref 6.5–8.1)

## 2019-08-10 LAB — TROPONIN I (HIGH SENSITIVITY)
Troponin I (High Sensitivity): 18 ng/L — ABNORMAL HIGH (ref <18)
Troponin I (High Sensitivity): 20 ng/L — ABNORMAL HIGH (ref <18)

## 2019-08-10 LAB — MAGNESIUM
Magnesium: 1.7 mg/dL (ref 1.7–2.4)
Magnesium: 1.7 mg/dL (ref 1.7–2.4)

## 2019-08-10 MED ORDER — PIPERACILLIN-TAZOBACTAM 3.375 G IVPB
3.3750 g | Freq: Three times a day (TID) | INTRAVENOUS | Status: DC
  Administered 2019-08-10 – 2019-08-11 (×3): 3.375 g via INTRAVENOUS
  Filled 2019-08-10 (×4): qty 50

## 2019-08-10 MED ORDER — PANTOPRAZOLE SODIUM 40 MG PO TBEC
40.0000 mg | DELAYED_RELEASE_TABLET | Freq: Every day | ORAL | Status: DC
  Administered 2019-08-11 – 2019-08-13 (×2): 40 mg via ORAL
  Filled 2019-08-10 (×2): qty 1

## 2019-08-10 MED ORDER — SODIUM CHLORIDE 0.9% FLUSH
10.0000 mL | Freq: Two times a day (BID) | INTRAVENOUS | Status: DC
  Administered 2019-08-10 – 2019-08-12 (×2): 10 mL

## 2019-08-10 MED ORDER — SODIUM CHLORIDE 0.9% FLUSH
10.0000 mL | INTRAVENOUS | Status: DC | PRN
  Administered 2019-08-13: 10 mL
  Filled 2019-08-10: qty 40

## 2019-08-10 MED ORDER — SALINE SPRAY 0.65 % NA SOLN
1.0000 | NASAL | Status: DC | PRN
  Administered 2019-08-10: 1 via NASAL
  Filled 2019-08-10: qty 44

## 2019-08-10 NOTE — Telephone Encounter (Signed)
noted 

## 2019-08-10 NOTE — Progress Notes (Signed)
Continues to complain of GI distress, retching on and off, Ativan .25mg  given per order-will assess for effectiveness, patient made comfortable.

## 2019-08-10 NOTE — Plan of Care (Signed)
  Problem: Education: Goal: Knowledge of General Education information will improve Description: Including pain rating scale, medication(s)/side effects and non-pharmacologic comfort measures Outcome: Progressing   Problem: Clinical Measurements: Goal: Respiratory complications will improve Outcome: Progressing   Problem: Activity: Goal: Risk for activity intolerance will decrease Outcome: Progressing   

## 2019-08-10 NOTE — Progress Notes (Signed)
Julie Rice ambulated to bathroom and RN made aware by Central Monitoring that patient's HR went into  V-Tach/SVT. Strips caught on monitor for review. Rice ambulated back to the bed. She reported some discomfort in chest and SOB that resolved shortly after. EKG obtained and Rice converted back to sinus rhythm.   MD Darliss Cheney made aware at 1740. Received orders to put in lab draw for magnesium and troponin. Vitals taken at 1747 MEWS is green. No acute changes in vitals.   Rapid Response RN called 1755 to put the patient on their radar.   Patient remains Alert and Oriented X4 and reports no pain or discomfort. Will continue monitor and assess patient.

## 2019-08-10 NOTE — Progress Notes (Addendum)
          Daily Rounding Note  08/10/2019, 9:15 AM  LOS: 3 days   SUBJECTIVE:   Chief complaint: Dysphagia.   OBJECTIVE:         Vital signs in last 24 hours:    Temp:  [97.7 F (36.5 C)-98.8 F (37.1 C)] 97.7 F (36.5 C) (10/06 0736) Pulse Rate:  [82] 82 (10/06 0736) Resp:  [16-18] 16 (10/06 0736) BP: (153-169)/(63-78) 169/69 (10/06 0736) SpO2:  [99 %-100 %] 100 % (10/06 0736) Weight:  [28.6 kg] 28.6 kg (10/06 0415) Last BM Date: 08/08/19 Filed Weights   08/07/19 0613 08/09/19 0328 08/10/19 0415  Weight: 63.7 kg 30.2 kg 28.6 kg   Pt not in room.  Did not see her  Intake/Output from previous day: 10/05 0701 - 10/06 0700 In: -  Out: 2130 [Urine:1750]  Intake/Output this shift: No intake/output data recorded.  Lab Results: Recent Labs    08/08/19 0643 08/09/19 0732 08/10/19 0401  WBC 12.2* 10.3 9.3  HGB 8.1* 8.6* 8.9*  HCT 26.0* 28.3* 28.5*  PLT 168 228 258   BMET Recent Labs    08/08/19 0643 08/09/19 0732 08/10/19 0401  NA 140 140 142  K 3.5 3.6 3.8  CL 108 107 108  CO2 25 24 26   GLUCOSE 94 105* 94  BUN 16 8 5*  CREATININE 0.78 0.70 0.67  CALCIUM 8.0* 8.3* 8.4*   LFT Recent Labs    08/09/19 0732 08/10/19 0401  PROT 5.5* 5.2*  ALBUMIN 2.6* 2.4*  AST 15 14*  ALT 12 13  ALKPHOS 58 60  BILITOT 0.9 0.5   PT/INR No results for input(s): LABPROT, INR in the last 72 hours. Hepatitis Panel No results for input(s): HEPBSAG, HCVAB, HEPAIGM, HEPBIGM in the last 72 hours.  Studies/Results: No results found.  ASSESMENT:   *    Recurrent dysphagia in patient s/p fundoplication 06/6577 with esophageal dysmotility/aperistalsis and recurrent esophageal stricture.  A few months of improvement following Botox injection and balloon dilatation 09/2018.  *    Aspiration pneumonia.  *     Normocytic anemia.  Chronic.   PLAN   *    Barium esophagram today    Azucena Freed  08/10/2019, 9:15 AM      Attending physician's note   I have taken an interval history, reviewed the chart and examined the patient. I agree with the Advanced Practitioner's note, impression and recommendations.   Ba swallow reviewed. Shows tight fundoplication with sig holdup of barium. Assoc esophageal dysmotility.  Patient able to tolerate liquid diet.  Plan: -She wants to try mashed potatoes.  Advance diet to soft diet.  I think she should be able to tolerate it.  Feed in sitting position. -EGD with balloon dilatation/Botox on 10/8 (d/t scheduling constraints).  Also, would like barium to get cleared prior to EGD.  I have explained risks and benefits. -Protonix 40 mg p.o. once a day.  Carmell Austria, MD [facility] [phone].

## 2019-08-10 NOTE — Care Management Important Message (Signed)
Important Message  Patient Details  Name: Julie Rice MRN: [id number] Date of Birth: 09-07-1936   Medicare Important Message Given:  Yes     Carman Auxier 08/10/2019, 1:41 PM

## 2019-08-10 NOTE — Progress Notes (Signed)
PROGRESS NOTE    Julie Rice  POE:[id number] DOB: Mar 08, 1936 DOA: 08/07/2019 PCP: [name], NP   Brief Narrative:  Julie Rice is a 83 y.o. female with medical history significant of hypertension, hyperlipidemia, systolic CHF, fibromyalgia with chronic pain, anemia, esophageal stricture, large symptomatic hiatal hernia status post fundoplication, post fundoplication dysphagia secondary to dysmotility (primary versus secondary with technically limited esophageal manometry), asthma/COPD, oxygen dependent on 2 L, CAD, TIA, and bipolar disorder; who presented to ED on 08/07/2019 with complaints of shortness of breath and chest pain starting morning around 3-4 AM.  Symptoms woke her out of her sleep.  Chest pain was radiating to the left side of the back.  She also had associated diaphoresis, fever, malaise, nausea and cough with brownish sputum and vomiting. On EMS arrival they had to break down the door to get inside her home and found her to be hypoxic down to 70% on 3 L.  She was given with sublingual nitroglycerin and placed on CPAP with improvement of symptoms. Upon arrival to the ED, blood pressure was 100 F, slightly tachycardic and tachypneic with normal blood pressure.  Patient initially arrived on CPAP but was switched to nonrebreather mask and was then weaned down soon to 3 L of nasal collar.  Her lactic acid was 3.3.  Margins were 10.5. hemoglobin 9.8, BNP 748.8, troponin 18, and lactic acid 3.3.  ABG revealed pH of 7.423, PCO2 39.8, and PO2 65.  Chest x-ray significant for extensive airspace disease in left upper and lower lobe, and to a lesser extent right lower lobe concerning for multilobar pneumonia.  COVID-19 screening was negative.  Blood cultures were obtained.  Patient was given 1 L normal saline IV fluids, nitroglycerin paste, Rocephin, and azithromycin.  TR[facility] called to admit for suspected pneumonia.  She was admitted with acute on chronic hypoxic respiratory failure due to  pneumonia and was continued on Rocephin and doxycycline.  Patient then continued to have persistent nausea and vomiting.  She then revealed that she had a history of esophageal stricture status post esophageal dilatation with Botox couple of times and in fact she was scheduled to have this done once again as an outpatient on 08/10/2019.  Due to persistent nausea and vomiting, GI was consulted.  She underwent barium swallow today.   Assessment & Plan:   Active Problems:   Hypokalemia   Chest pain   Multifocal pneumonia   Prolonged QT interval   Sepsis (HCC)   Acute on chronic respiratory failure with hypoxia (HCC)  Sepsis and acute on chronic hypoxic respiratory failure secondary to recurrent aspiration pneumonia: Likely secondary to persistent esophageal stricture.  She continues to have nausea and vomiting.  She had 4 episodes last night again.  This needs to be treated.  She was scheduled to have EGD today with Dr. [name] as an outpatient.  I consulted GI yesterday.  She underwent barium swallow today and this shows significant narrowing of the upper part of the esophagus just below the hiatal hernia but no esophageal stricture.  Urine antigens negative for Legionella and streptococci.Marland Kitchen  COVID-19 negative.  She is currently on 5 L of oxygen.  Continue Zosyn and doxycycline.  I think she will continue to have recurrent aspiration pneumonia illness her GI issues are treated.  Chest pain/ elevated troponin: Apparently she did complain of having chest pain at the time of admission.  No more chest pain since then. Her troponin initially was 18 and repeat was 76.  Normal  troponins. EKG did not have any ST-T wave changes.  This could very well be due to demand ischemia.  Monitor on telemetry.  History of esophageal stricture status post dilatations multiple times/esophageal dysmotility: Persistent nausea and vomiting.  She had another 4 episodes just last night.  GI on board.  She underwent barium swallow  today which shows significant narrowing of the upper part of the esophagus but no esophageal narrowing.  Further plans per GI.  Hypokalemia: Resolved.  Chronic systolic congestive heart failure: Euvolemic.  Continue carvedilol, Entresto and Aldactone.  Essential hypertension: Controlled.  Continue current regimen.  Chronic pain/fibromyalgia: PR Tylenol and Toradol.  Bipolar disorder/anxiety: Continue Zoloft and clonazepam.  DVT prophylaxis: Lovenox Code Status: Full code Family Communication:  None present at bedside.  Plan of care discussed with patient in length and she verbalized understanding and agreed with it. Disposition Plan: TBD  Consultants:   None  Procedures:   None  Antimicrobials:   IV Zosyn and doxycycline   Subjective: Patient seen and examined after she returned from barium swallow.  She stated that she continues to have shortness of breath and in fact had 4 episodes of vomiting last night.  No new complaint.  Objective: Vitals:   08/09/19 2308 08/10/19 0132 08/10/19 0415 08/10/19 0736  BP: (!) 153/63 (!) 154/72  (!) 169/69  Pulse:    82  Resp: 16 16  16   Temp: 98.8 F (37.1 C)   97.7 F (36.5 C)  TempSrc: Oral   Oral  SpO2: 100% 99%  100%  Weight:   28.6 kg   Height:        Intake/Output Summary (Last 24 hours) at 08/10/2019 1057 Last data filed at 08/09/2019 1945 Gross per 24 hour  Intake --  Output 1250 ml  Net -1250 ml   Filed Weights   08/07/19 0613 08/09/19 0328 08/10/19 0415  Weight: 63.7 kg 30.2 kg 28.6 kg    Examination:  General exam: Appears calm and comfortable  Respiratory system: Coarse breath sounds with rhonchi in the left middle and lower lobe and right lower lobe. Respiratory effort normal. Cardiovascular system: S1 & S2 heard, RRR. No JVD, murmurs, rubs, gallops or clicks. No pedal edema. Gastrointestinal system: Abdomen is nondistended, soft and nontender. No organomegaly or masses felt. Normal bowel sounds  heard. Central nervous system: Alert and oriented. No focal neurological deficits. Extremities: Symmetric 5 x 5 power. Skin: No rashes, lesions or ulcers.  Psychiatry: Judgement and insight appear normal. Mood & affect appropriate.   Data Reviewed: I have personally reviewed following labs and imaging studies  CBC: Recent Labs  Lab 08/07/19 0710 08/07/19 0919 08/07/19 1800 08/08/19 0643 08/09/19 0732 08/10/19 0401  WBC 10.5  --   --  12.2* 10.3 9.3  NEUTROABS 9.6*  --   --   --  8.5* 7.2  HGB 9.8* 8.8* 9.3* 8.1* 8.6* 8.9*  HCT 33.3* 26.0* 29.4* 26.0* 28.3* 28.5*  MCV 94.3  --   --  91.2 91.0 89.6  PLT 244  --   --  168 228 258   Basic Metabolic Panel: Recent Labs  Lab 08/07/19 0710 08/07/19 0919 08/08/19 0643 08/09/19 0732 08/10/19 0401  NA 141 142 140 140 142  K 3.5 3.1* 3.5 3.6 3.8  CL 103  --  108 107 108  CO2 27  --  25 24 26   GLUCOSE 158*  --  94 105* 94  BUN 14  --  16 8 5*  CREATININE 0.89  --  0.78 0.70 0.67  CALCIUM 9.3  --  8.0* 8.3* 8.4*  MG  --   --   --  1.8 1.7   GFR: Estimated Creatinine Clearance: 24.9 mL/min (by C-G formula based on SCr of 0.67 mg/dL). Liver Function Tests: Recent Labs  Lab 08/09/19 0732 08/10/19 0401  AST 15 14*  ALT 12 13  ALKPHOS 58 60  BILITOT 0.9 0.5  PROT 5.5* 5.2*  ALBUMIN 2.6* 2.4*   No results for input(s): LIPASE, AMYLASE in the last 168 hours. No results for input(s): AMMONIA in the last 168 hours. Coagulation Profile: No results for input(s): INR, PROTIME in the last 168 hours. Cardiac Enzymes: No results for input(s): CKTOTAL, CKMB, CKMBINDEX, TROPONINI in the last 168 hours. BNP (last 3 results) Recent Labs    01/12/19 1427  PROBNP 196.0*   HbA1C: No results for input(s): HGBA1C in the last 72 hours. CBG: No results for input(s): GLUCAP in the last 168 hours. Lipid Profile: No results for input(s): CHOL, HDL, LDLCALC, TRIG, CHOLHDL, LDLDIRECT in the last 72 hours. Thyroid Function Tests: No  results for input(s): TSH, T4TOTAL, FREET4, T3FREE, THYROIDAB in the last 72 hours. Anemia Panel: No results for input(s): VITAMINB12, FOLATE, FERRITIN, TIBC, IRON, RETICCTPCT in the last 72 hours. Sepsis Labs: Recent Labs  Lab 08/07/19 0709 08/07/19 1020 08/07/19 1130 08/07/19 1844 08/08/19 0643 08/08/19 0853  PROCALCITON  --  2.80  --   --  7.70  --   LATICACIDVEN 3.3*  --  0.9 2.0*  --  1.0    Recent Results (from the past 240 hour(s))  SARS Coronavirus 2 [facility] order, Performed in [facility] hospital lab) Nasopharyngeal Nasopharyngeal Swab     Status: None   Collection Time: 08/07/19  6:10 AM   Specimen: Nasopharyngeal Swab  Result Value Ref Range Status   SARS Coronavirus 2 NEGATIVE NEGATIVE Final    Comment: (NOTE) If result is NEGATIVE SARS-CoV-2 target nucleic acids are NOT DETECTED. The SARS-CoV-2 RNA is generally detectable in upper and lower  respiratory specimens during the acute phase of infection. The lowest  concentration of SARS-CoV-2 viral copies this assay can detect is 250  copies / mL. A negative result does not preclude SARS-CoV-2 infection  and should not be used as the sole basis for treatment or other  patient management decisions.  A negative result may occur with  improper specimen collection / handling, submission of specimen other  than nasopharyngeal swab, presence of viral mutation(s) within the  areas targeted by this assay, and inadequate number of viral copies  (<250 copies / mL). A negative result must be combined with clinical  observations, patient history, and epidemiological information. If result is POSITIVE SARS-CoV-2 target nucleic acids are DETECTED. The SARS-CoV-2 RNA is generally detectable in upper and lower  respiratory specimens dur ing the acute phase of infection.  Positive  results are indicative of active infection with SARS-CoV-2.  Clinical  correlation with patient history and other diagnostic information is   necessary to determine patient infection status.  Positive results do  not rule out bacterial infection or co-infection with other viruses. If result is PRESUMPTIVE POSTIVE SARS-CoV-2 nucleic acids MAY BE PRESENT.   A presumptive positive result was obtained on the submitted specimen  and confirmed on repeat testing.  While 2019 novel coronavirus  (SARS-CoV-2) nucleic acids may be present in the submitted sample  additional confirmatory testing may be necessary for epidemiological  and / or clinical management purposes  to differentiate  between  SARS-CoV-2 and other Sarbecovirus currently known to infect humans.  If clinically indicated additional testing with an alternate test  methodology [id number]) is advised. The SARS-CoV-2 RNA is generally  detectable in upper and lower respiratory sp ecimens during the acute  phase of infection. The expected result is Negative. Fact Sheet for Patients:  BoilerBrush.com.cyhttps://www.fda.gov/media/136312/download Fact Sheet for Healthcare Providers: https://pope.com/https://www.fda.gov/media/136313/download This test is not yet approved or cleared by the [place] FDA and has been authorized for detection and/or diagnosis of SARS-CoV-2 by FDA under an Emergency Use Authorization (EUA).  This EUA will remain in effect (meaning this test can be used) for the duration of the COVID-19 declaration under Section 564(b)(1) of the Act, 21 U.S.C. section 360bbb-3(b)(1), unless the authorization is terminated or revoked sooner. Performed at [facility], [address] [place], [place] [address]   Culture, blood (Routine X 2) w Reflex to ID Panel     Status: None (Preliminary result)   Collection Time: 08/07/19  7:40 AM   Specimen: BLOOD LEFT WRIST  Result Value Ref Range Status   Specimen Description BLOOD LEFT WRIST  Final   Special Requests   Final    BOTTLES DRAWN AEROBIC AND ANAEROBIC Blood Culture results may not be optimal due to an inadequate volume of blood received  in culture bottles   Culture   Final    NO GROWTH 2 DAYS Performed at [facility], [address] [place], [place] [address]    Report Status PENDING  Incomplete  Culture, blood (Routine X 2) w Reflex to ID Panel     Status: None (Preliminary result)   Collection Time: 08/07/19  8:23 AM   Specimen: BLOOD RIGHT ARM  Result Value Ref Range Status   Specimen Description BLOOD RIGHT ARM  Final   Special Requests   Final    BOTTLES DRAWN AEROBIC AND ANAEROBIC Blood Culture adequate volume   Culture   Final    NO GROWTH 2 DAYS Performed at [facility], [address] [place], [place] [address]    Report Status PENDING  Incomplete  Urine culture     Status: None   Collection Time: 08/07/19  4:00 PM   Specimen: In/Out Cath Urine  Result Value Ref Range Status   Specimen Description IN/OUT CATH URINE  Final   Special Requests   Final    ADDED 0533 08/08/2019 Performed at [facility], [address] [place], [place] [address]    Culture   Final    Multiple bacterial morphotypes present, none predominant. Suggest appropriate recollection if clinically indicated.   Report Status 08/09/2019 FINAL  Final  Culture, sputum-assessment     Status: None   Collection Time: 08/07/19  6:06 PM   Specimen: Sputum  Result Value Ref Range Status   Specimen Description SPUTUM  Final   Special Requests NONE  Final   Sputum evaluation   Final    Sputum specimen not acceptable for testing.  Please recollect.   RESULT CALLED TO, READ BACK BY AND VERIFIED WITH: RN KRISTY H. 1642 [id number] FCP Performed at [facility], [address] [place], [place] [address]    Report Status 08/08/2019 FINAL  Final      Radiology Studies: Dg Esophagus W Single Cm (sol Or Thin Ba)  Result Date: 08/10/2019 CLINICAL DATA:  Patient presents with difficulty swallowing with nausea vomiting. She has a long history of esophageal problems. She underwent a fundoplication  in 2017. She has had  repeated endoscopies, last performed in November 2019 with balloon dilatation. Small hiatal hernia and esophageal diverticulum noted at that study. EXAM: ESOPHOGRAM/BARIUM SWALLOW TECHNIQUE: Single contrast examination was performed using  thin barium. FLUOROSCOPY TIME:  Fluoroscopy Time:  1 minutes and 48 seconds. Radiation Exposure Index (if provided by the fluoroscopic device): 15.7 mGy Number of Acquired Spot Images: 1 series.  Thirty-three images. COMPARISON:  chest radiograph, 08/07/2019. CT angiogram of the chest, 07/22/2019 FINDINGS: Pharyngeal swallowing function is unremarkable with no evidence of aspiration or penetration. No visualized mass or diverticulum. Esophagus is mildly distended. There is a hiatal hernia with a nonobstructing mucosal ring at the junction of the distal esophagus and hiatal hernia. There is no significant narrowing of the esophagus. No mass or mucosal abnormality. Esophagus shows abnormal motility with tertiary contractions and significant distal barium stasis. No reflux was documented during the exam. Below the hiatal hernia, just below the level of the hemidiaphragm, there is a significant narrowing of the stomach, extending for approximately 4 cm in length. This area of narrowing causes significant holdup to passage of barium. Below the narrowed proximal stomach, the remainder of the stomach was mostly decompressed, but normal in configuration with a grossly normal mucosal fold pattern. Barium was seen extending into the duodenum and proximal small bowel. IMPRESSION: 1. Significant narrowing of the proximal stomach just below a small hiatal hernia, just below the level of the hemidiaphragm. This may reflect the patient's fundoplication. It causes significant holdup to passage of the barium with distal barium stasis. 2. No evidence of an esophageal mass or stricture. No esophageal inflammation. 3. Abnormal esophageal motility with tertiary contractions. Electronically Signed    By: Amie Portland M.D.   On: 08/10/2019 10:11    Scheduled Meds:  amLODipine  2.5 mg Oral Daily   aspirin EC  81 mg Oral Daily   carvedilol  3.125 mg Oral BID WC   gabapentin  100 mg Oral TID   guaiFENesin  600 mg Oral BID   sacubitril-valsartan  1 tablet Oral BID   sertraline  100 mg Oral QHS   sodium chloride flush  3 mL Intravenous Q12H   spironolactone  12.5 mg Oral Daily   Continuous Infusions:  doxycycline (VIBRAMYCIN) IV 100 mg (08/10/19 0404)   piperacillin-tazobactam (ZOSYN)  IV       LOS: 3 days   Time spent: 28 minutes    Hughie Closs, MD [facility]  08/10/2019, 10:57 AM   To contact the attending provider between 7A-7P or the covering provider during after hours 7P-7A, please log into the web site [email or web address] and use password [email or web address].

## 2019-08-11 DIAGNOSIS — J189 Pneumonia, unspecified organism: Secondary | ICD-10-CM

## 2019-08-11 LAB — CBC WITH DIFFERENTIAL/PLATELET
Abs Immature Granulocytes: 0.03 10*3/uL (ref 0.00–0.07)
Basophils Absolute: 0.1 10*3/uL (ref 0.0–0.1)
Basophils Relative: 1 %
Eosinophils Absolute: 0.3 10*3/uL (ref 0.0–0.5)
Eosinophils Relative: 3 %
HCT: 29.8 % — ABNORMAL LOW (ref 36.0–46.0)
Hemoglobin: 9.3 g/dL — ABNORMAL LOW (ref 12.0–15.0)
Immature Granulocytes: 0 %
Lymphocytes Relative: 12 %
Lymphs Abs: 1.1 10*3/uL (ref 0.7–4.0)
MCH: 27.8 pg (ref 26.0–34.0)
MCHC: 31.2 g/dL (ref 30.0–36.0)
MCV: 89 fL (ref 80.0–100.0)
Monocytes Absolute: 0.7 10*3/uL (ref 0.1–1.0)
Monocytes Relative: 7 %
Neutro Abs: 7.3 10*3/uL (ref 1.7–7.7)
Neutrophils Relative %: 77 %
Platelets: 325 10*3/uL (ref 150–400)
RBC: 3.35 MIL/uL — ABNORMAL LOW (ref 3.87–5.11)
RDW: 14.8 % (ref 11.5–15.5)
WBC: 9.4 10*3/uL (ref 4.0–10.5)
nRBC: 0 % (ref 0.0–0.2)

## 2019-08-11 LAB — TROPONIN I (HIGH SENSITIVITY): Troponin I (High Sensitivity): 20 ng/L — ABNORMAL HIGH (ref <18)

## 2019-08-11 MED ORDER — SODIUM CHLORIDE 0.9 % IV SOLN
3.0000 g | Freq: Two times a day (BID) | INTRAVENOUS | Status: AC
  Administered 2019-08-11 – 2019-08-12 (×3): 3 g via INTRAVENOUS
  Filled 2019-08-11: qty 3
  Filled 2019-08-11: qty 8
  Filled 2019-08-11 (×2): qty 3

## 2019-08-11 MED ORDER — SODIUM CHLORIDE 0.9 % IV SOLN
INTRAVENOUS | Status: DC
  Administered 2019-08-12: 06:00:00 via INTRAVENOUS

## 2019-08-11 NOTE — Progress Notes (Signed)
PROGRESS NOTE    Julie Rice  ZOX:[id number] DOB: 05/12/1936 DOA: 08/07/2019 PCP: [name], [name], NP   Brief Narrative:  Patient is 83 year old female with history of hypertension, hyperlipidemia, systolic CHF, fibromyalgia, anemia, esophageal stricture, large symptomatic hiatal hernia status post fundoplication, chronic dysphagia, asthma/COPD on oxygen at 2 L at home, CAD, TIA, bipolar disorder who presented on 08/07/2019 with complaint of shortness of breath, chest pain.  On arrival she was found to be hypoxic at 70% on 3 L.  Chest x-ray done on presentation showed extensive airspace disease in the left upper and lower lobe concerning for multilobar pneumonia.  Covid-19 screening was negative.  Patient was started on broad-spectrum antibiotics.  Suspected aspiration pneumonia.  GI consulted.  Plan for esophageal dilation tomorrow.  Assessment & Plan:   Active Problems:   Hypokalemia   Chest pain   Multifocal pneumonia   Prolonged QT interval   Sepsis (HCC)   Acute on chronic respiratory failure with hypoxia (HCC)   Acute on chronic hypoxic respiratory failure: Secondary to aspiration pneumonia.  History of esophageal stricture.  Suspicious history of multifocal pneumonia most likely secondary to aspiration.  Currently on Zosyn and doxycycline.  I will change antibiotic to Unasyn.  Currently she is on 3 L of oxygen per minute.  She is on 2 L of oxygen at baseline at home.  Chronic dysphagia/esophageal stricture: Reported of nausea and vomiting at home.  She was following with Dr. Marina GoodellPerry, GI as an outpatient.  Barium swallow study showed significant narrowing of the upper part of the esophagus but no esophageal stricture.  GI planning for esophageal ballon dilatation/Botox injection tomorrow.  Chest pain/elevated troponin: Currently chest pain-free.  EKG did not show any ischemic changes.  Most likely due to demand ischemia.  Chronic systolic congestive heart failure: Currently  euvolemic.  Continue carvedilol, Entresto, Aldactone.  Echocardiogram done on 9/20 showed left ventricular ejection fraction of 35 to 40%, pseudonormalization pattern of left ventricular diastolic filling  Hypertension: Continue current regimen.  Blood pressure stable  Chronic pain syndrome/fibromyalgia: Continue supportive care  Bipolar disorder/anxiety: Continue Zoloft, clonazepam  Debility/deconditioning: Patient lives alone at home.  Does not use walker or cane on ambulation.  Will request for physical therapy evaluation.          DVT prophylaxis: Lovenox Code Status: Full Family Communication: None present at the bedside Disposition Plan: Home after full work up   Consultants: GI  Procedures:None  Antimicrobials:  Anti-infectives (From admission, onward)   Start     Dose/Rate Route Frequency Ordered Stop   08/10/19 1400  piperacillin-tazobactam (ZOSYN) IVPB 3.375 g     3.375 g 12.5 mL/hr over 240 Minutes Intravenous Every 8 hours 08/10/19 1029     08/08/19 1400  piperacillin-tazobactam (ZOSYN) IVPB 3.375 g  Status:  Discontinued     3.375 g 100 mL/hr over 30 Minutes Intravenous Every 8 hours 08/08/19 1121 08/10/19 1029   08/08/19 1000  cefTRIAXone (ROCEPHIN) 2 g in sodium chloride 0.9 % 100 mL IVPB  Status:  Discontinued     2 g 200 mL/hr over 30 Minutes Intravenous Every 24 hours 08/07/19 1003 08/07/19 1003   08/08/19 1000  azithromycin (ZITHROMAX) 500 mg in sodium chloride 0.9 % 250 mL IVPB  Status:  Discontinued     500 mg 250 mL/hr over 60 Minutes Intravenous Every 24 hours 08/07/19 1003 08/07/19 1003   08/07/19 1600  doxycycline (VIBRAMYCIN) 100 mg in sodium chloride 0.9 % 250 mL IVPB  100 mg 125 mL/hr over 120 Minutes Intravenous Every 12 hours 08/07/19 1515     08/07/19 0700  cefTRIAXone (ROCEPHIN) 2 g in sodium chloride 0.9 % 100 mL IVPB  Status:  Discontinued     2 g 200 mL/hr over 30 Minutes Intravenous Every 24 hours 08/07/19 0649 08/08/19 1121    08/07/19 0700  azithromycin (ZITHROMAX) 500 mg in sodium chloride 0.9 % 250 mL IVPB  Status:  Discontinued     500 mg 250 mL/hr over 60 Minutes Intravenous Every 24 hours 08/07/19 0649 08/07/19 1426      Subjective:  Patient seen and examined at bedside this morning.  Hemodynamically stable.  She was not in any kind of respiratory distress.  Was having some cough.  Tolerating some soft diet.  Waiting for procedure tomorrow  Objective: Vitals:   08/10/19 2257 08/11/19 0341 08/11/19 0500 08/11/19 0742  BP: (!) 150/80   (!) 162/69  Pulse: 86   83  Resp: (!) 21     Temp: 98.4 F (36.9 C)   97.8 F (36.6 C)  TempSrc:    Oral  SpO2: 99%   98%  Weight:  35.5 kg 35.4 kg   Height:        Intake/Output Summary (Last 24 hours) at 08/11/2019 0953 Last data filed at 08/11/2019 0341 Gross per 24 hour  Intake -  Output 700 ml  Net -700 ml   Filed Weights   08/10/19 0415 08/11/19 0341 08/11/19 0500  Weight: 28.6 kg 35.5 kg 35.4 kg    Examination:  General exam: Not in distress, pleasant elderly female HEENT:PERRL,Oral mucosa moist, Ear/Nose normal on gross exam Respiratory system: Bilateral decreased air entry, crackles on the left side Cardiovascular system: S1 & S2 heard, RRR. No JVD, murmurs, rubs, gallops or clicks.  Trace pedal edema. Gastrointestinal system: Abdomen is nondistended, soft and nontender. No organomegaly or masses felt. Normal bowel sounds heard. Central nervous system: Alert and oriented. No focal neurological deficits. Extremities: Mild bilateral lower extremity edema, no clubbing ,no cyanosis, distal peripheral pulses palpable. Skin: No rashes, lesions or ulcers,no icterus ,no pallor    Data Reviewed: I have personally reviewed following labs and imaging studies  CBC: Recent Labs  Lab 08/07/19 0710  08/07/19 1800 08/08/19 0643 08/09/19 0732 08/10/19 0401 08/11/19 0049  WBC 10.5  --   --  12.2* 10.3 9.3 9.4  NEUTROABS 9.6*  --   --   --  8.5* 7.2 7.3   HGB 9.8*   < > 9.3* 8.1* 8.6* 8.9* 9.3*  HCT 33.3*   < > 29.4* 26.0* 28.3* 28.5* 29.8*  MCV 94.3  --   --  91.2 91.0 89.6 89.0  PLT 244  --   --  168 228 258 325   < > = values in this interval not displayed.   Basic Metabolic Panel: Recent Labs  Lab 08/07/19 0710 08/07/19 0919 08/08/19 0643 08/09/19 0732 08/10/19 0401 08/10/19 1850  NA 141 142 140 140 142  --   K 3.5 3.1* 3.5 3.6 3.8  --   CL 103  --  108 107 108  --   CO2 27  --  25 24 26   --   GLUCOSE 158*  --  94 105* 94  --   BUN 14  --  16 8 5*  --   CREATININE 0.89  --  0.78 0.70 0.67  --   CALCIUM 9.3  --  8.0* 8.3* 8.4*  --  MG  --   --   --  1.8 1.7 1.7   GFR: Estimated Creatinine Clearance: 30.8 mL/min (by C-G formula based on SCr of 0.67 mg/dL). Liver Function Tests: Recent Labs  Lab 08/09/19 0732 08/10/19 0401  AST 15 14*  ALT 12 13  ALKPHOS 58 60  BILITOT 0.9 0.5  PROT 5.5* 5.2*  ALBUMIN 2.6* 2.4*   No results for input(s): LIPASE, AMYLASE in the last 168 hours. No results for input(s): AMMONIA in the last 168 hours. Coagulation Profile: No results for input(s): INR, PROTIME in the last 168 hours. Cardiac Enzymes: No results for input(s): CKTOTAL, CKMB, CKMBINDEX, TROPONINI in the last 168 hours. BNP (last 3 results) Recent Labs    01/12/19 1427  PROBNP 196.0*   HbA1C: No results for input(s): HGBA1C in the last 72 hours. CBG: No results for input(s): GLUCAP in the last 168 hours. Lipid Profile: No results for input(s): CHOL, HDL, LDLCALC, TRIG, CHOLHDL, LDLDIRECT in the last 72 hours. Thyroid Function Tests: No results for input(s): TSH, T4TOTAL, FREET4, T3FREE, THYROIDAB in the last 72 hours. Anemia Panel: No results for input(s): VITAMINB12, FOLATE, FERRITIN, TIBC, IRON, RETICCTPCT in the last 72 hours. Sepsis Labs: Recent Labs  Lab 08/07/19 0709 08/07/19 1020 08/07/19 1130 08/07/19 1844 08/08/19 0643 08/08/19 0853  PROCALCITON  --  2.80  --   --  7.70  --   LATICACIDVEN 3.3*   --  0.9 2.0*  --  1.0    Recent Results (from the past 240 hour(s))  SARS Coronavirus 2 [facility] order, Performed in [facility] hospital lab) Nasopharyngeal Nasopharyngeal Swab     Status: None   Collection Time: 08/07/19  6:10 AM   Specimen: Nasopharyngeal Swab  Result Value Ref Range Status   SARS Coronavirus 2 NEGATIVE NEGATIVE Final    Comment: (NOTE) If result is NEGATIVE SARS-CoV-2 target nucleic acids are NOT DETECTED. The SARS-CoV-2 RNA is generally detectable in upper and lower  respiratory specimens during the acute phase of infection. The lowest  concentration of SARS-CoV-2 viral copies this assay can detect is 250  copies / mL. A negative result does not preclude SARS-CoV-2 infection  and should not be used as the sole basis for treatment or other  patient management decisions.  A negative result may occur with  improper specimen collection / handling, submission of specimen other  than nasopharyngeal swab, presence of viral mutation(s) within the  areas targeted by this assay, and inadequate number of viral copies  (<250 copies / mL). A negative result must be combined with clinical  observations, patient history, and epidemiological information. If result is POSITIVE SARS-CoV-2 target nucleic acids are DETECTED. The SARS-CoV-2 RNA is generally detectable in upper and lower  respiratory specimens dur ing the acute phase of infection.  Positive  results are indicative of active infection with SARS-CoV-2.  Clinical  correlation with patient history and other diagnostic information is  necessary to determine patient infection status.  Positive results do  not rule out bacterial infection or co-infection with other viruses. If result is PRESUMPTIVE POSTIVE SARS-CoV-2 nucleic acids MAY BE PRESENT.   A presumptive positive result was obtained on the submitted specimen  and confirmed on repeat testing.  While 2019 novel coronavirus  (SARS-CoV-2) nucleic acids may be  present in the submitted sample  additional confirmatory testing may be necessary for epidemiological  and / or clinical management purposes  to differentiate between  SARS-CoV-2 and other Sarbecovirus currently known to infect humans.  If clinically indicated additional testing with an alternate test  methodology [phone]) is advised. The SARS-CoV-2 RNA is generally  detectable in upper and lower respiratory sp ecimens during the acute  phase of infection. The expected result is Negative. Fact Sheet for Patients:  BoilerBrush.com.cy Fact Sheet for Healthcare Providers: https://pope.com/ This test is not yet approved or cleared by the [place] FDA and has been authorized for detection and/or diagnosis of SARS-CoV-2 by FDA under an Emergency Use Authorization (EUA).  This EUA will remain in effect (meaning this test can be used) for the duration of the COVID-19 declaration under Section 564(b)(1) of the Act, 21 U.S.C. section 360bbb-3(b)(1), unless the authorization is terminated or revoked sooner. Performed at [facility], [address] [place], [place] [address]   Culture, blood (Routine X 2) w Reflex to ID Panel     Status: None (Preliminary result)   Collection Time: 08/07/19  7:40 AM   Specimen: BLOOD LEFT WRIST  Result Value Ref Range Status   Specimen Description BLOOD LEFT WRIST  Final   Special Requests   Final    BOTTLES DRAWN AEROBIC AND ANAEROBIC Blood Culture results may not be optimal due to an inadequate volume of blood received in culture bottles   Culture   Final    NO GROWTH 3 DAYS Performed at [facility], [address], [place], [place] [address]    Report Status PENDING  Incomplete  Culture, blood (Routine X 2) w Reflex to ID Panel     Status: None (Preliminary result)   Collection Time: 08/07/19  8:23 AM   Specimen: BLOOD RIGHT ARM  Result Value Ref Range Status   Specimen Description  BLOOD RIGHT ARM  Final   Special Requests   Final    BOTTLES DRAWN AEROBIC AND ANAEROBIC Blood Culture adequate volume   Culture   Final    NO GROWTH 3 DAYS Performed at [facility], [address], [place], [place] [address]    Report Status PENDING  Incomplete  Urine culture     Status: None   Collection Time: 08/07/19  4:00 PM   Specimen: In/Out Cath Urine  Result Value Ref Range Status   Specimen Description IN/OUT CATH URINE  Final   Special Requests   Final    ADDED 0533 08/08/2019 Performed at [facility], [address], [place], [place] [address]    Culture   Final    Multiple bacterial morphotypes present, none predominant. Suggest appropriate recollection if clinically indicated.   Report Status 08/09/2019 FINAL  Final  Culture, sputum-assessment     Status: None   Collection Time: 08/07/19  6:06 PM   Specimen: Sputum  Result Value Ref Range Status   Specimen Description SPUTUM  Final   Special Requests NONE  Final   Sputum evaluation   Final    Sputum specimen not acceptable for testing.  Please recollect.   RESULT CALLED TO, READ BACK BY AND VERIFIED WITH: RN KRISTY H. 1642 [id number] FCP Performed at [facility], [address] [place], [place] [address]    Report Status 08/08/2019 FINAL  Final         Radiology Studies: Dg Esophagus W Single Cm (sol Or Thin Ba)  Result Date: 08/10/2019 CLINICAL DATA:  Patient presents with difficulty swallowing with nausea vomiting. She has a long history of esophageal problems. She underwent a fundoplication in 2017. She has had repeated endoscopies, last performed  in November 2019 with balloon dilatation. Small hiatal hernia and esophageal diverticulum noted at that study. EXAM: ESOPHOGRAM/BARIUM SWALLOW TECHNIQUE: Single contrast examination was performed using  thin barium. FLUOROSCOPY TIME:  Fluoroscopy Time:  1 minutes and 48 seconds. Radiation Exposure Index (if provided by the fluoroscopic device): 15.7  mGy Number of Acquired Spot Images: 1 series.  Thirty-three images. COMPARISON:  chest radiograph, 08/07/2019. CT angiogram of the chest, 07/22/2019 FINDINGS: Pharyngeal swallowing function is unremarkable with no evidence of aspiration or penetration. No visualized mass or diverticulum. Esophagus is mildly distended. There is a hiatal hernia with a nonobstructing mucosal ring at the junction of the distal esophagus and hiatal hernia. There is no significant narrowing of the esophagus. No mass or mucosal abnormality. Esophagus shows abnormal motility with tertiary contractions and significant distal barium stasis. No reflux was documented during the exam. Below the hiatal hernia, just below the level of the hemidiaphragm, there is a significant narrowing of the stomach, extending for approximately 4 cm in length. This area of narrowing causes significant holdup to passage of barium. Below the narrowed proximal stomach, the remainder of the stomach was mostly decompressed, but normal in configuration with a grossly normal mucosal fold pattern. Barium was seen extending into the duodenum and proximal small bowel. IMPRESSION: 1. Significant narrowing of the proximal stomach just below a small hiatal hernia, just below the level of the hemidiaphragm. This may reflect the patient's fundoplication. It causes significant holdup to passage of the barium with distal barium stasis. 2. No evidence of an esophageal mass or stricture. No esophageal inflammation. 3. Abnormal esophageal motility with tertiary contractions. Electronically Signed   By: Amie Portland M.D.   On: 08/10/2019 10:11        Scheduled Meds: . amLODipine  2.5 mg Oral Daily  . aspirin EC  81 mg Oral Daily  . carvedilol  3.125 mg Oral BID WC  . gabapentin  100 mg Oral TID  . guaiFENesin  600 mg Oral BID  . pantoprazole  40 mg Oral Q0600  . sacubitril-valsartan  1 tablet Oral BID  . sertraline  100 mg Oral QHS  . sodium chloride flush  10-40 mL  Intracatheter Q12H  . sodium chloride flush  3 mL Intravenous Q12H  . spironolactone  12.5 mg Oral Daily   Continuous Infusions: . doxycycline (VIBRAMYCIN) IV 100 mg (08/10/19 2136)  . piperacillin-tazobactam (ZOSYN)  IV 3.375 g (08/11/19 0535)     LOS: 4 days    Time spent:35 mins. More than 50% of that time was spent in counseling and/or coordination of care.      Burnadette Pop, MD [facility] Pager [phone]  If 7PM-7AM, please contact night-coverage [email or web address] Password [email or web address] 08/11/2019, 9:53 AM

## 2019-08-11 NOTE — Progress Notes (Addendum)
Daily Rounding Note  08/11/2019, 11:22 AM  LOS: 4 days   SUBJECTIVE:   Chief complaint:   Dysphagia  No acute events overnight.  OBJECTIVE:         Vital signs in last 24 hours:    Temp:  [97.8 F (36.6 C)-99 F (37.2 C)] 97.8 F (36.6 C) (10/07 0742) Pulse Rate:  [76-87] 83 (10/07 0742) Resp:  [15-21] 21 (10/06 2257) BP: (150-162)/(60-80) 162/69 (10/07 0742) SpO2:  [98 %-100 %] 98 % (10/07 0742) Weight:  [35.4 kg-35.5 kg] 35.4 kg (10/07 0500) Last BM Date: 08/08/19 Filed Weights   08/10/19 0415 08/11/19 0341 08/11/19 0500  Weight: 28.6 kg 35.5 kg 35.4 kg   Patient sleeping.  Did not awaken to examine her. Breathing quietly.  RRR in 60s on monitor  Intake/Output from previous day: 10/06 0701 - 10/07 0700 In: -  Out: 700 [Urine:700]  Intake/Output this shift: No intake/output data recorded.  Lab Results: Recent Labs    08/09/19 0732 08/10/19 0401 08/11/19 0049  WBC 10.3 9.3 9.4  HGB 8.6* 8.9* 9.3*  HCT 28.3* 28.5* 29.8*  PLT 228 258 325   BMET Recent Labs    08/09/19 0732 08/10/19 0401  NA 140 142  K 3.6 3.8  CL 107 108  CO2 24 26  GLUCOSE 105* 94  BUN 8 5*  CREATININE 0.70 0.67  CALCIUM 8.3* 8.4*   LFT Recent Labs    08/09/19 0732 08/10/19 0401  PROT 5.5* 5.2*  ALBUMIN 2.6* 2.4*  AST 15 14*  ALT 12 13  ALKPHOS 58 60  BILITOT 0.9 0.5   PT/INR No results for input(s): LABPROT, INR in the last 72 hours. Hepatitis Panel No results for input(s): HEPBSAG, HCVAB, HEPAIGM, HEPBIGM in the last 72 hours.  Studies/Results: Dg Esophagus W Single Cm (sol Or Thin Ba)  Result Date: 08/10/2019 CLINICAL DATA:  Patient presents with difficulty swallowing with nausea vomiting. She has a long history of esophageal problems. She underwent a fundoplication in 2017. She has had repeated endoscopies, last performed in November 2019 with balloon dilatation. Small hiatal hernia and esophageal  diverticulum noted at that study. EXAM: ESOPHOGRAM/BARIUM SWALLOW TECHNIQUE: Single contrast examination was performed using  thin barium. FLUOROSCOPY TIME:  Fluoroscopy Time:  1 minutes and 48 seconds. Radiation Exposure Index (if provided by the fluoroscopic device): 15.7 mGy Number of Acquired Spot Images: 1 series.  Thirty-three images. COMPARISON:  chest radiograph, 08/07/2019. CT angiogram of the chest, 07/22/2019 FINDINGS: Pharyngeal swallowing function is unremarkable with no evidence of aspiration or penetration. No visualized mass or diverticulum. Esophagus is mildly distended. There is a hiatal hernia with a nonobstructing mucosal ring at the junction of the distal esophagus and hiatal hernia. There is no significant narrowing of the esophagus. No mass or mucosal abnormality. Esophagus shows abnormal motility with tertiary contractions and significant distal barium stasis. No reflux was documented during the exam. Below the hiatal hernia, just below the level of the hemidiaphragm, there is a significant narrowing of the stomach, extending for approximately 4 cm in length. This area of narrowing causes significant holdup to passage of barium. Below the narrowed proximal stomach, the remainder of the stomach was mostly decompressed, but normal in configuration with a grossly normal mucosal fold pattern. Barium was seen extending into the duodenum and proximal small bowel. IMPRESSION: 1. Significant narrowing of the proximal stomach just below a small hiatal hernia, just below the level of the hemidiaphragm. This  may reflect the patient's fundoplication. It causes significant holdup to passage of the barium with distal barium stasis. 2. No evidence of an esophageal mass or stricture. No esophageal inflammation. 3. Abnormal esophageal motility with tertiary contractions. Electronically Signed   By: Lajean Manes M.D.   On: 08/10/2019 10:11    ASSESMENT:   *    Dysphagia.  Fundoplication 0/2637.   Esophageal dysmotility/aperistalsis.  Recurrent esophageal stricture. 08/10/2019 esophagram shows narrowing at the proximal stomach just below small hiatal hernia below level of hemidiaphragm possibly reflecting previous fundoplication.  There is significant hold-up in passage of the tablet and barium stasis in that area.  Esophageal dysmotility with tertiary contractions.  *     Aspiration, T lobar pneumonia.  *     Normocytic anemia.  Chronic.  *     Elevated troponin attributed to demand ischemia.   PLAN   *     EGD with balloon dilation and Botox injection set up for 10:30 AM tomorrow    Azucena Freed  08/11/2019, 11:22 AM Phone [phone]   Attending physician's note   I have taken an interval history, reviewed the chart and examined the patient. I agree with the Advanced Practitioner's note, impression and recommendations.   For EGD with dil/botox in AM.  I have explained risks and benefits.  Carmell Austria, MD [facility] [phone].

## 2019-08-11 NOTE — Progress Notes (Signed)
Pharmacy Antibiotic Note  Julie Rice is a 83 y.o. female admitted on 08/07/2019 with Aspiration PNA .  Pharmacy has been consulted for Unasyn dosing.  Height: 5\' 3"  (160 cm) Weight: 78 lb 0.7 oz (35.4 kg) IBW/kg (Calculated) : 52.4  Temp (24hrs), Avg:98.3 F (36.8 C), Min:97.8 F (36.6 C), Max:99 F (37.2 C)  Recent Labs  Lab 08/07/19 0709 08/07/19 0710 08/07/19 1130 08/07/19 1844 08/08/19 0643 08/08/19 0853 08/09/19 0732 08/10/19 0401 08/11/19 0049  WBC  --  10.5  --   --  12.2*  --  10.3 9.3 9.4  CREATININE  --  0.89  --   --  0.78  --  0.70 0.67  --   LATICACIDVEN 3.3*  --  0.9 2.0*  --  1.0  --   --   --     Estimated Creatinine Clearance: 30.8 mL/min (by C-G formula based on SCr of 0.67 mg/dL).    Allergies  Allergen Reactions  . Morphine Other (See Comments)    Flushing, rash, itching  . Tape Other (See Comments)    Band aides, adhesive tape Redness and pulls skin off    Antimicrobials this admission: 10/3 Azithromycin >> 10/3 10/3 Ceftriaxine >> 10/5 10/3 Doxycycline>> 10/7 10/4 Zosyn >> 10/7 10/7 Unasyn >>  Microbiology results: 10/3 BCx: NG 10/3 UCx: NG  10/3 Sputum: specimen not acceptable    Plan:  - Will start patient on Unasyn 3g IV q12h  - Stop date entered for 08/13/19 for a 7 days of total treatment of aspiration PNA - Pharmacy will sign off at this time.   Thank you for allowing pharmacy to be a part of this patient's care.  Duanne Limerick PharmD. BCPS  08/11/2019 10:29 AM

## 2019-08-11 NOTE — Plan of Care (Signed)
  Problem: Clinical Measurements: Goal: Ability to maintain clinical measurements within normal limits will improve Outcome: Progressing   Problem: Clinical Measurements: Goal: Respiratory complications will improve Outcome: Progressing   

## 2019-08-11 NOTE — H&P (View-Only) (Signed)
Daily Rounding Note  08/11/2019, 11:22 AM  LOS: 4 days   SUBJECTIVE:   Chief complaint:   Dysphagia  No acute events overnight.  OBJECTIVE:         Vital signs in last 24 hours:    Temp:  [97.8 F (36.6 C)-99 F (37.2 C)] 97.8 F (36.6 C) (10/07 0742) Pulse Rate:  [76-87] 83 (10/07 0742) Resp:  [15-21] 21 (10/06 2257) BP: (150-162)/(60-80) 162/69 (10/07 0742) SpO2:  [98 %-100 %] 98 % (10/07 0742) Weight:  [35.4 kg-35.5 kg] 35.4 kg (10/07 0500) Last BM Date: 08/08/19 Filed Weights   08/10/19 0415 08/11/19 0341 08/11/19 0500  Weight: 28.6 kg 35.5 kg 35.4 kg   Patient sleeping.  Did not awaken to examine her. Breathing quietly.  RRR in 60s on monitor  Intake/Output from previous day: 10/06 0701 - 10/07 0700 In: -  Out: 700 [Urine:700]  Intake/Output this shift: No intake/output data recorded.  Lab Results: Recent Labs    08/09/19 0732 08/10/19 0401 08/11/19 0049  WBC 10.3 9.3 9.4  HGB 8.6* 8.9* 9.3*  HCT 28.3* 28.5* 29.8*  PLT 228 258 325   BMET Recent Labs    08/09/19 0732 08/10/19 0401  NA 140 142  K 3.6 3.8  CL 107 108  CO2 24 26  GLUCOSE 105* 94  BUN 8 5*  CREATININE 0.70 0.67  CALCIUM 8.3* 8.4*   LFT Recent Labs    08/09/19 0732 08/10/19 0401  PROT 5.5* 5.2*  ALBUMIN 2.6* 2.4*  AST 15 14*  ALT 12 13  ALKPHOS 58 60  BILITOT 0.9 0.5   PT/INR No results for input(s): LABPROT, INR in the last 72 hours. Hepatitis Panel No results for input(s): HEPBSAG, HCVAB, HEPAIGM, HEPBIGM in the last 72 hours.  Studies/Results: Dg Esophagus W Single Cm (sol Or Thin Ba)  Result Date: 08/10/2019 CLINICAL DATA:  Patient presents with difficulty swallowing with nausea vomiting. She has a long history of esophageal problems. She underwent a fundoplication in 2017. She has had repeated endoscopies, last performed in November 2019 with balloon dilatation. Small hiatal hernia and esophageal  diverticulum noted at that study. EXAM: ESOPHOGRAM/BARIUM SWALLOW TECHNIQUE: Single contrast examination was performed using  thin barium. FLUOROSCOPY TIME:  Fluoroscopy Time:  1 minutes and 48 seconds. Radiation Exposure Index (if provided by the fluoroscopic device): 15.7 mGy Number of Acquired Spot Images: 1 series.  Thirty-three images. COMPARISON:  chest radiograph, 08/07/2019. CT angiogram of the chest, 07/22/2019 FINDINGS: Pharyngeal swallowing function is unremarkable with no evidence of aspiration or penetration. No visualized mass or diverticulum. Esophagus is mildly distended. There is a hiatal hernia with a nonobstructing mucosal ring at the junction of the distal esophagus and hiatal hernia. There is no significant narrowing of the esophagus. No mass or mucosal abnormality. Esophagus shows abnormal motility with tertiary contractions and significant distal barium stasis. No reflux was documented during the exam. Below the hiatal hernia, just below the level of the hemidiaphragm, there is a significant narrowing of the stomach, extending for approximately 4 cm in length. This area of narrowing causes significant holdup to passage of barium. Below the narrowed proximal stomach, the remainder of the stomach was mostly decompressed, but normal in configuration with a grossly normal mucosal fold pattern. Barium was seen extending into the duodenum and proximal small bowel. IMPRESSION: 1. Significant narrowing of the proximal stomach just below a small hiatal hernia, just below the level of the hemidiaphragm. This  may reflect the patient's fundoplication. It causes significant holdup to passage of the barium with distal barium stasis. 2. No evidence of an esophageal mass or stricture. No esophageal inflammation. 3. Abnormal esophageal motility with tertiary contractions. Electronically Signed   By: Lajean Manes M.D.   On: 08/10/2019 10:11    ASSESMENT:   *    Dysphagia.  Fundoplication 0/2637.   Esophageal dysmotility/aperistalsis.  Recurrent esophageal stricture. 08/10/2019 esophagram shows narrowing at the proximal stomach just below small hiatal hernia below level of hemidiaphragm possibly reflecting previous fundoplication.  There is significant hold-up in passage of the tablet and barium stasis in that area.  Esophageal dysmotility with tertiary contractions.  *     Aspiration, T lobar pneumonia.  *     Normocytic anemia.  Chronic.  *     Elevated troponin attributed to demand ischemia.   PLAN   *     EGD with balloon dilation and Botox injection set up for 10:30 AM tomorrow    Azucena Freed  08/11/2019, 11:22 AM Phone [phone]   Attending physician's note   I have taken an interval history, reviewed the chart and examined the patient. I agree with the Advanced Practitioner's note, impression and recommendations.   For EGD with dil/botox in AM.  I have explained risks and benefits.  Carmell Austria, MD [facility] [phone].

## 2019-08-11 NOTE — Progress Notes (Signed)
SLP Cancellation Note  Patient Details Name: Julie Rice MRN: [id number] DOB: [birth year]   Cancelled treatment:       Reason Eval/Treat Not Completed: Other (comment). Given finding of esophageal impairment will defer to GI and sign off. No further SLP interventions at this time.    Honor Fairbank, Katherene Ponto 08/11/2019, 8:44 AM

## 2019-08-12 ENCOUNTER — Inpatient Hospital Stay (HOSPITAL_COMMUNITY): Payer: Medicare Other | Admitting: Anesthesiology

## 2019-08-12 ENCOUNTER — Encounter (HOSPITAL_COMMUNITY): Payer: Self-pay | Admitting: Anesthesiology

## 2019-08-12 ENCOUNTER — Encounter (HOSPITAL_COMMUNITY)
Admission: EM | Disposition: A | Discharge status: Home Health | Payer: Self-pay | Source: Home / Self Care | Attending: Family Medicine

## 2019-08-12 DIAGNOSIS — K222 Esophageal obstruction: Secondary | ICD-10-CM

## 2019-08-12 DIAGNOSIS — K449 Diaphragmatic hernia without obstruction or gangrene: Secondary | ICD-10-CM

## 2019-08-12 HISTORY — PX: BIOPSY: SHX5522

## 2019-08-12 HISTORY — PX: BOTOX INJECTION: SHX5754

## 2019-08-12 HISTORY — PX: ESOPHAGOGASTRODUODENOSCOPY (EGD) WITH PROPOFOL: SHX5813

## 2019-08-12 HISTORY — PX: BALLOON DILATION: SHX5330

## 2019-08-12 LAB — MAGNESIUM: Magnesium: 1.6 mg/dL — ABNORMAL LOW (ref 1.7–2.4)

## 2019-08-12 LAB — BASIC METABOLIC PANEL
Anion gap: 9 (ref 5–15)
BUN: <5 mg/dL — ABNORMAL LOW (ref 8–23)
CO2: 28 mmol/L (ref 22–32)
Calcium: 8.5 mg/dL — ABNORMAL LOW (ref 8.9–10.3)
Chloride: 105 mmol/L (ref 98–111)
Creatinine, Ser: 0.64 mg/dL (ref 0.44–1.00)
GFR calc Af Amer: >60 mL/min (ref >60)
GFR calc non Af Amer: >60 mL/min (ref >60)
Glucose, Bld: 102 mg/dL — ABNORMAL HIGH (ref 70–99)
Potassium: 2.8 mmol/L — ABNORMAL LOW (ref 3.5–5.1)
Sodium: 142 mmol/L (ref 135–145)

## 2019-08-12 LAB — CBC WITH DIFFERENTIAL/PLATELET
Abs Immature Granulocytes: 0.04 10*3/uL (ref 0.00–0.07)
Basophils Absolute: 0.1 10*3/uL (ref 0.0–0.1)
Basophils Relative: 1 %
Eosinophils Absolute: 0.3 10*3/uL (ref 0.0–0.5)
Eosinophils Relative: 3 %
HCT: 30.5 % — ABNORMAL LOW (ref 36.0–46.0)
Hemoglobin: 9.7 g/dL — ABNORMAL LOW (ref 12.0–15.0)
Immature Granulocytes: 0 %
Lymphocytes Relative: 9 %
Lymphs Abs: 0.9 10*3/uL (ref 0.7–4.0)
MCH: 28.2 pg (ref 26.0–34.0)
MCHC: 31.8 g/dL (ref 30.0–36.0)
MCV: 88.7 fL (ref 80.0–100.0)
Monocytes Absolute: 0.5 10*3/uL (ref 0.1–1.0)
Monocytes Relative: 5 %
Neutro Abs: 8 10*3/uL — ABNORMAL HIGH (ref 1.7–7.7)
Neutrophils Relative %: 82 %
Platelets: 348 10*3/uL (ref 150–400)
RBC: 3.44 MIL/uL — ABNORMAL LOW (ref 3.87–5.11)
RDW: 14.9 % (ref 11.5–15.5)
WBC: 9.8 10*3/uL (ref 4.0–10.5)
nRBC: 0 % (ref 0.0–0.2)

## 2019-08-12 LAB — CULTURE, BLOOD (ROUTINE X 2)
Culture: NO GROWTH
Culture: NO GROWTH
Special Requests: ADEQUATE

## 2019-08-12 SURGERY — ESOPHAGOGASTRODUODENOSCOPY (EGD) WITH PROPOFOL
Anesthesia: Monitor Anesthesia Care

## 2019-08-12 MED ORDER — BUTAMBEN-TETRACAINE-BENZOCAINE 2-2-14 % EX AERO
INHALATION_SPRAY | CUTANEOUS | Status: DC | PRN
  Administered 2019-08-12: 2 via TOPICAL

## 2019-08-12 MED ORDER — DIMENHYDRINATE 50 MG PO TABS
25.0000 mg | ORAL_TABLET | Freq: Once | ORAL | Status: DC
  Filled 2019-08-12: qty 1

## 2019-08-12 MED ORDER — POTASSIUM CHLORIDE 10 MEQ/100ML IV SOLN
10.0000 meq | Freq: Once | INTRAVENOUS | Status: DC
  Filled 2019-08-12: qty 100

## 2019-08-12 MED ORDER — POTASSIUM CHLORIDE 20 MEQ PO PACK
40.0000 meq | PACK | Freq: Once | ORAL | Status: AC
  Administered 2019-08-12: 40 meq via ORAL
  Filled 2019-08-12: qty 2

## 2019-08-12 MED ORDER — LACTATED RINGERS IV SOLN
INTRAVENOUS | Status: DC
  Administered 2019-08-12: 09:00:00 via INTRAVENOUS

## 2019-08-12 MED ORDER — SODIUM CHLORIDE (PF) 0.9 % IJ SOLN
100.0000 [IU] | Freq: Once | INTRAMUSCULAR | Status: AC
  Administered 2019-08-12: 100 [IU] via SUBMUCOSAL
  Filled 2019-08-12: qty 100

## 2019-08-12 MED ORDER — POTASSIUM CHLORIDE 10 MEQ/100ML IV SOLN
10.0000 meq | INTRAVENOUS | Status: AC
  Administered 2019-08-12 (×2): 10 meq via INTRAVENOUS
  Filled 2019-08-12: qty 100

## 2019-08-12 MED ORDER — ONDANSETRON HCL 4 MG/2ML IJ SOLN
INTRAMUSCULAR | Status: DC | PRN
  Administered 2019-08-12: 4 mg via INTRAVENOUS

## 2019-08-12 MED ORDER — PROPOFOL 500 MG/50ML IV EMUL
INTRAVENOUS | Status: DC | PRN
  Administered 2019-08-12: 100 ug/kg/min via INTRAVENOUS

## 2019-08-12 MED ORDER — SODIUM CHLORIDE (PF) 0.9 % IJ SOLN: 100.0000 [IU] | Freq: Once | INTRAMUSCULAR | Status: DC

## 2019-08-12 MED ORDER — MAGNESIUM SULFATE 2 GM/50ML IV SOLN
2.0000 g | Freq: Once | INTRAVENOUS | Status: AC
  Administered 2019-08-12: 2 g via INTRAVENOUS
  Filled 2019-08-12: qty 50

## 2019-08-12 MED ORDER — HYDROXYZINE HCL 50 MG/ML IM SOLN
25.0000 mg | Freq: Four times a day (QID) | INTRAMUSCULAR | Status: DC | PRN
  Administered 2019-08-12 – 2019-08-13 (×2): 25 mg via INTRAMUSCULAR
  Filled 2019-08-12 (×3): qty 0.5

## 2019-08-12 MED ORDER — SODIUM CHLORIDE 0.9 % IV SOLN: INTRAVENOUS | Status: DC

## 2019-08-12 MED ORDER — PROPOFOL 10 MG/ML IV BOLUS
INTRAVENOUS | Status: DC | PRN
  Administered 2019-08-12: 20 mg via INTRAVENOUS
  Administered 2019-08-12: 30 mg via INTRAVENOUS
  Administered 2019-08-12 (×2): 10 mg via INTRAVENOUS
  Administered 2019-08-12: 20 mg via INTRAVENOUS
  Administered 2019-08-12: 30 mg via INTRAVENOUS

## 2019-08-12 MED ORDER — AMOXICILLIN-POT CLAVULANATE 875-125 MG PO TABS
1.0000 | ORAL_TABLET | Freq: Two times a day (BID) | ORAL | Status: DC
  Administered 2019-08-13: 1 via ORAL
  Filled 2019-08-12: qty 1

## 2019-08-12 MED ORDER — DIMENHYDRINATE 50 MG/ML IJ SOLN
25.0000 mg | Freq: Once | INTRAMUSCULAR | Status: DC
  Filled 2019-08-12: qty 0.5

## 2019-08-12 MED ORDER — POTASSIUM CHLORIDE 10 MEQ/100ML IV SOLN
10.0000 meq | INTRAVENOUS | Status: AC
  Administered 2019-08-12 (×5): 10 meq via INTRAVENOUS
  Filled 2019-08-12 (×5): qty 100

## 2019-08-12 SURGICAL SUPPLY — 15 items

## 2019-08-12 NOTE — Evaluation (Signed)
Physical Therapy Evaluation Patient Details Name: Julie Rice MRN: [id number] DOB: [birth year] Today's Date: 08/12/2019   History of Present Illness  83 y.o. female with medical history significant of hypertension, hyperlipidemia, systolic CHF, fibromyalgia with chronic pain, anemia, esophageal dysmotility, asthma/COPD, oxygen dependent on 2 L, CAD, TIA, and bipolar disorder; who presents with complaints of shortness of breath and chest pain. Admitted 08/07/19 for treatment of sepsis and acute on chronic hypoxic respiratory failure secondary to recurrent aspiration pneumonia complicated by GI issures including esophageal strictures. s/p esophageal balloon dilatation 08/12/19  Clinical Impression  PTA living alone in single story home with 1 step to enter, pt had d/c'd from prior hospitalization and was participating in [place]. Pt reports independence with ambulation (although reports a list of furniture that she uses for support in her home) because she refuses to use DME even though she has it. Pt's daughter has assisted in iADLs, pt independent with ADLs. Pt reports her sister in-law will stay with her when she d/c's. Pt is currently limited in safe mobility by decreased strength and endurance. Pt is independent with bed mobility, supervision for transfers and min guard for ambulation with use of furniture for support. PT recommending resumption of HHPT at discharge. PT will continue to follow acutely to progress mobility.     Follow Up Recommendations Home health PT;Supervision/Assistance - 24 hour    Equipment Recommendations  None recommended by PT    Recommendations for Other Services       Precautions / Restrictions Precautions Precautions: Fall Restrictions Weight Bearing Restrictions: No      Mobility  Bed Mobility Overal bed mobility: Independent                Transfers Overall transfer level: Needs assistance Equipment used: None Transfers: Sit to/from Stand Sit to  Stand: Supervision         General transfer comment: supervision for safety  Ambulation/Gait Ambulation/Gait assistance: Min guard Gait Distance (Feet): 30 Feet Assistive device: None Gait Pattern/deviations: Step-through pattern;Decreased step length - right;Decreased step length - left;Shuffle;Trunk flexed Gait velocity: slowed   General Gait Details: min guard for slow shuffling gait with increased usage of sink , door jamb and handrails for steadying, pt refuses to use RW        Balance Overall balance assessment: Needs assistance Sitting-balance support: Feet supported;No upper extremity supported Sitting balance-Leahy Scale: Good     Standing balance support: During functional activity;No upper extremity supported Standing balance-Leahy Scale: Fair Standing balance comment: can stand statically without UEs upport                              Pertinent Vitals/Pain Pain Assessment: No/denies pain    Home Living Family/patient expects to be discharged to:: Private residence Living Arrangements: Alone Available Help at Discharge: Family;Available 24 hours/day Type of Home: House Home Access: Stairs to enter Entrance Stairs-Rails: None(uses decorative pillars) Entrance Stairs-Number of Steps: 1 Home Layout: One level Home Equipment: Walker - 2 wheels;Cane - single point;Tub bench;Grab bars - tub/shower Additional Comments: reports all of her DME is in basement and she does not need them     Prior Function Level of Independence: Independent         Comments: Pt reports she drives and does light yard work         Extremity/Trunk Assessment   Upper Extremity Assessment Upper Extremity Assessment: Generalized weakness    Lower Extremity Assessment  Lower Extremity Assessment: Generalized weakness    Cervical / Trunk Assessment Cervical / Trunk Assessment: Kyphotic  Communication   Communication: No difficulties  Cognition Arousal/Alertness:  Awake/alert Behavior During Therapy: WFL for tasks assessed/performed Overall Cognitive Status: Within Functional Limits for tasks assessed                                        General Comments General comments (skin integrity, edema, etc.): Pt on 3L O2 at baseline, SaO2 98%O2 and 92%O2 after short distance ambulation        Assessment/Plan    PT Assessment Patient needs continued PT services  PT Problem List Decreased strength;Decreased balance;Decreased activity tolerance;Cardiopulmonary status limiting activity       PT Treatment Interventions      PT Goals (Current goals can be found in the Care Plan section)  Acute Rehab PT Goals Patient Stated Goal: to feel better  PT Goal Formulation: With patient Time For Goal Achievement: 08/26/19 Potential to Achieve Goals: Good    Frequency Min 3X/week    AM-PAC PT "6 Clicks" Mobility  Outcome Measure Help needed turning from your back to your side while in a flat bed without using bedrails?: None Help needed moving from lying on your back to sitting on the side of a flat bed without using bedrails?: None Help needed moving to and from a bed to a chair (including a wheelchair)?: None Help needed standing up from a chair using your arms (e.g., wheelchair or bedside chair)?: A Little Help needed to walk in hospital room?: A Little Help needed climbing 3-5 steps with a railing? : A Lot 6 Click Score: 20    End of Session Equipment Utilized During Treatment: Gait belt Activity Tolerance: Patient limited by fatigue Patient left: with call bell/phone within reach;in bed;with family/visitor present Nurse Communication: Mobility status PT Visit Diagnosis: Other abnormalities of gait and mobility (R26.89)    Time: 4627-0350 PT Time Calculation (min) (ACUTE ONLY): 17 min   Charges:   PT Evaluation $PT Eval Moderate Complexity: 1 Mod          Lizett Chowning B. Beverely Risen PT, DPT Acute Rehabilitation  Services Pager [phone] Office [phone]   Elon Alas Fleet 08/12/2019, 3:00 PM

## 2019-08-12 NOTE — Progress Notes (Signed)
PT Cancellation Note  Patient Details Name: Julie Rice MRN: [id number] DOB: 1936-08-10   Cancelled Treatment:    Reason Eval/Treat Not Completed: (P) Patient at procedure or test/unavailable Pt off floor. PT will follow back for Eval this afternoon as able.   Ryian Lynde B. Migdalia Dk PT, DPT Acute Rehabilitation Services Pager [phone] Office [phone]   [place] 08/12/2019, 9:52 AM

## 2019-08-12 NOTE — Progress Notes (Signed)
I was informed by the monitor tech that pt had 3.1 ST segment elevation in leads V a MCL. K. Schorr, NP was made aware that the pt has 3.1 ST segment elevation in leads V & MCL and the last note that was written by Dr. Tawanna Solo on 08/11/2019 at 10:37am said There was no ischemic changes in the EKG for 08/11/2019.

## 2019-08-12 NOTE — Transfer of Care (Signed)
Immediate Anesthesia Transfer of Care Note  Patient: Julie Rice  Procedure(s) Performed: ESOPHAGOGASTRODUODENOSCOPY (EGD) WITH PROPOFOL (N/A ) BOTOX INJECTION (N/A ) BALLOON DILATION (N/A ) BIOPSY  Patient Location: PACU  Anesthesia Type:MAC  Level of Consciousness: awake, oriented and patient cooperative  Airway & Oxygen Therapy: Patient Spontanous Breathing and Patient connected to nasal cannula oxygen  Post-op Assessment: Report given to RN and Post -op Vital signs reviewed and stable  Post vital signs: Reviewed  Last Vitals:  Vitals Value Taken Time  BP 117/58 08/12/19 1223  Temp    Pulse 81 08/12/19 1224  Resp 19 08/12/19 1224  SpO2 95 % 08/12/19 1224  Vitals shown include unvalidated device data.  Last Pain:  Vitals:   08/12/19 0926  TempSrc: Temporal  PainSc: 0-No pain      Patients Stated Pain Goal: 0 (41/03/01 3143)  Complications: No apparent anesthesia complications

## 2019-08-12 NOTE — Anesthesia Preprocedure Evaluation (Signed)
Anesthesia Evaluation  Patient identified by MRN, date of birth, ID band Patient awake    Reviewed: Allergy & Precautions, Patient's Chart, lab work & pertinent test results, reviewed documented beta blocker date and time   History of Anesthesia Complications (+) PONV and history of anesthetic complications  Airway Mallampati: III  TM Distance: >3 FB Neck ROM: Full    Dental no notable dental hx. (+) Teeth Intact, Dental Advisory Given   Pulmonary shortness of breath, asthma , pneumonia, COPD (3L O2 all the time),  oxygen dependent,    Pulmonary exam normal breath sounds clear to auscultation       Cardiovascular hypertension, + CAD and +CHF  Normal cardiovascular exam+ dysrhythmias Supra Ventricular Tachycardia + Valvular Problems/Murmurs MR  Rhythm:Regular Rate:Normal  EKG LBBB, SR  LHC/RHC 07/26/19 Right dominant coronary anatomy Widely patent left main 30 to 40% mid LAD Widely patent circumflex Luminal irregularities in a dominant right coronary. Right heart pressures are relatively low.  Pulmonary wedge pressure mean is 2 mmHg. LVEDP 4 mmHg.  Left ventriculography by hand-injection was not helpful.  From images obtained, LV function appears relatively normal.  TTE 07/23/19 EF 35-40%, diffuse hypokinesis, mod MR, mildly elevated PASP   Neuro/Psych PSYCHIATRIC DISORDERS Anxiety Depression Bipolar Disorder TIA   GI/Hepatic hiatal hernia, GERD  ,  Endo/Other  Hypothyroidism   Renal/GU      Musculoskeletal  (+) Arthritis , Fibromyalgia -  Abdominal   Peds  Hematology  (+) Blood dyscrasia (Hgb 9.7), anemia ,   Anesthesia Other Findings Admitted on 10/3 with SOB found to have aspiration pneumonia. Recently admitted 9/17-9/22 with acute CHF exacerbation.   Reproductive/Obstetrics                             Anesthesia Physical Anesthesia Plan  ASA: IV  Anesthesia Plan: MAC    Post-op Pain Management:    Induction: Intravenous  PONV Risk Score and Plan: 3 and Propofol infusion and Treatment may vary due to age or medical condition  Airway Management Planned: Natural Airway  Additional Equipment:   Intra-op Plan:   Post-operative Plan:   Informed Consent: I have reviewed the patients History and Physical, chart, labs and discussed the procedure including the risks, benefits and alternatives for the proposed anesthesia with the patient or authorized representative who has indicated his/her understanding and acceptance.     Dental advisory given  Plan Discussed with: CRNA  Anesthesia Plan Comments:         Anesthesia Quick Evaluation

## 2019-08-12 NOTE — Anesthesia Procedure Notes (Signed)
Procedure Name: MAC Date/Time: 08/12/2019 11:35 AM Performed by: Jenne Campus, CRNA Pre-anesthesia Checklist: Patient identified, Emergency Drugs available, Suction available and Patient being monitored Oxygen Delivery Method: Nasal cannula

## 2019-08-12 NOTE — Op Note (Addendum)
[facility] Patient Name: Julie Rice Procedure Date : 08/12/2019 MRN: [id number] Attending MD: Jackquline Denmark , MD Date of Birth: [birth year] CSN: [id number] Age: 83 Admit Type: Inpatient Procedure:                Upper GI endoscopy Indications:              Dysphagia, abnormal barium swallow. History of                            aspiration pneumonia. Has esophageal dysmotility. Providers:                Jackquline Denmark, MD, Josie Dixon, RN, Carlyn Reichert, RN, Luciana Axe, CRNA Referring MD:              Medicines:                Propofol per Anesthesia Complications:            No immediate complications. Estimated Blood Loss:     Estimated blood loss: none. Procedure:                Pre-Anesthesia Assessment:                           - Prior to the procedure, a History and Physical                            was performed, and patient medications and                            allergies were reviewed. The patient's tolerance of                            previous anesthesia was also reviewed. The risks                            and benefits of the procedure and the sedation                            options and risks were discussed with the patient.                            All questions were answered, and informed consent                            was obtained. Prior Anticoagulants: The patient has                            taken no previous anticoagulant or antiplatelet                            agents. ASA Grade Assessment: III - A patient with  severe systemic disease. After reviewing the risks                            and benefits, the patient was deemed in                            satisfactory condition to undergo the procedure.                           After obtaining informed consent, the endoscope was                            passed under direct vision. Throughout the        procedure, the patient's blood pressure, pulse, and                            oxygen saturations were monitored continuously. The                            GIF-H190 ([id number]) Olympus gastroscope was                            introduced through the mouth, and advanced to the                            second part of duodenum. The upper GI endoscopy was                            accomplished without difficulty. The patient                            tolerated the procedure well. Scope In: Scope Out: Findings:      The esophagus was dilated and atonic. A benign distal esophageal       stricture was noted with luminal diameter of 18 mm at the GE junction,       35 cm from the incisors. A small diverticulum was noted just proximal to       the stricture. This was dilated using a 20 mm TTS balloon x 1 min. 25 U       (1 ml) Botox was injected in all 4 quadrants 1 cm above the GE junction.       Biopsies were taken with a cold forceps for histology.      5 cm hiatal hernia was present. Previous fundoplication was noted.       Narrowing was noted at the junction of hiatal hernia and fundoplication       (correlated with barium swallow, almost giving it an hourglass       appearance). This would distend easily and scope could be passed beyond       with minimal resistance. This was also dilated using a 20 mm TTS balloon       x 1 min.      4-5 benign-appearing gastric polyps measuring 6 to 8 mm were noted in       the body of the stomach. Gastric mucosa was otherwise normal.      The  examined duodenum was normal. Impression:               - Benign-appearing esophageal stenosis. Dilated.                            Injected. Biopsied.                           - 5 cm hiatal hernia s/o slipped fundoplication.                           - Tight fundoplication s/p balloon dilatation. Recommendation:           - Eat and take medications in sitting position.                           - Continue  present medications.                           - Await pathology results.                           - Assess response to above dilatation.                           - Continue aspiration precautions.                           - Return to GI clinic (Dr [name]) in 4 weeks. Procedure Code(s):        --- Professional ---                           [phone], Esophagogastroduodenoscopy, flexible,                            transoral; with transendoscopic balloon dilation of                            esophagus (less than 30 mm diameter)                           43236, 59, Esophagogastroduodenoscopy, flexible,                            transoral; with directed submucosal injection(s),                            any substance                           43239, 59, Esophagogastroduodenoscopy, flexible,                            transoral; with biopsy, single or multiple Diagnosis Code(s):        --- Professional ---                           K22.2, Esophageal obstruction  K44.9, Diaphragmatic hernia without obstruction or                            gangrene                           R13.10, Dysphagia, unspecified CPT copyright 2019 [employer]. All rights reserved. The codes documented in this report are preliminary and upon coder review may  be revised to meet current compliance requirements. Lynann Bologna, MD 08/12/2019 12:56:13 PM This report has been signed electronically. Number of Addenda: 0

## 2019-08-12 NOTE — Interval H&P Note (Signed)
History and Physical Interval Note:  08/12/2019 10:15 AM  Julie Rice  has presented today for surgery, with the diagnosis of Esophageal stricture, dysphagia in patient with esophageal dysmotility, history of esophageal strictures.  Previous fundoplication.  The various methods of treatment have been discussed with the patient and family. After consideration of risks, benefits and other options for treatment, the patient has consented to  Procedure(s): ESOPHAGOGASTRODUODENOSCOPY (EGD) WITH PROPOFOL (N/A) BOTOX INJECTION (N/A) BALLOON DILATION (N/A) as a surgical intervention.  The patient's history has been reviewed, patient examined, no change in status, stable for surgery.  I have reviewed the patient's chart and labs.  Questions were answered to the patient's satisfaction.     Julie Rice

## 2019-08-12 NOTE — Progress Notes (Signed)
PROGRESS NOTE    Julie Rice  VGK:[id number] DOB: 20-Feb-1936 DOA: 08/07/2019 PCP: Shirline Frees, NP   Brief Narrative:  Patient is 83 year old female with history of hypertension, hyperlipidemia, systolic CHF, fibromyalgia, anemia, esophageal stricture, large symptomatic hiatal hernia status post fundoplication, chronic dysphagia, asthma/COPD on oxygen at 2 L at home, CAD, TIA, bipolar disorder who presented on 08/07/2019 with complaint of shortness of breath, chest pain.  On arrival she was found to be hypoxic at 70% on 3 L.  Chest x-ray done on presentation showed extensive airspace disease in the left upper and lower lobe concerning for multilobar pneumonia.  Covid-19 screening was negative.  Patient was started on broad-spectrum antibiotics.  Suspected aspiration pneumonia.  GI consulted.  Underwent balloon dilatation by GI today.  Assessment & Plan:   Active Problems:   Hypokalemia   Chest pain   Community acquired pneumonia of left lung   Prolonged QT interval   Sepsis (HCC)   Acute on chronic respiratory failure with hypoxia (HCC)   Acute on chronic hypoxic respiratory failure: Secondary to aspiration pneumonia.  History of esophageal stricture.  Suspicious history of multifocal pneumonia most likely secondary to aspiration.  Currently on Zosyn and doxycycline.Currently she is on 3 L of oxygen per minute.  She is on 2 L of oxygen at baseline at home.Antibiotics changed to oral.  Chronic dysphagia/esophageal stricture: Reported of nausea and vomiting at home.  She was following with Dr. [name], GI as an outpatient.  Barium swallow study showed significant narrowing of the upper part of the esophagus but no esophageal stricture.  Underwent EGD with finding of benign-appearing esophageal stenosis. Status post fundoplication, Botox injection, balloon dilation.  She should follow-up with GI in 4 weeks.Currently on soft diet.  Chest pain/elevated troponin: Currently chest pain-free.  EKG  did not show any ischemic changes.  Most likely due to demand ischemia.  Chronic systolic congestive heart failure: Currently euvolemic.  Continue carvedilol, Entresto, Aldactone.  Echocardiogram done on 9/20 showed left ventricular ejection fraction of 35 to 40%, pseudonormalization pattern of left ventricular diastolic filling  Hypertension: Continue current regimen.  Blood pressure stable  Chronic pain syndrome/fibromyalgia: Continue supportive care  Bipolar disorder/anxiety: Continue Zoloft, clonazepam  Debility/deconditioning: Patient lives alone at home.  Does not use walker or cane on ambulation.  Will request for physical therapy evaluation.          DVT prophylaxis: Lovenox Code Status: Full Family Communication: None present at the bedside Disposition Plan: Home likely tomorrow  Consultants: GI  Procedures:None  Antimicrobials:  Anti-infectives (From admission, onward)   Start     Dose/Rate Route Frequency Ordered Stop   08/11/19 1030  Ampicillin-Sulbactam (UNASYN) 3 g in sodium chloride 0.9 % 100 mL IVPB     3 g 200 mL/hr over 30 Minutes Intravenous Every 12 hours 08/11/19 1029 08/13/19 0129   08/10/19 1400  piperacillin-tazobactam (ZOSYN) IVPB 3.375 g  Status:  Discontinued     3.375 g 12.5 mL/hr over 240 Minutes Intravenous Every 8 hours 08/10/19 1029 08/11/19 1003   08/08/19 1400  piperacillin-tazobactam (ZOSYN) IVPB 3.375 g  Status:  Discontinued     3.375 g 100 mL/hr over 30 Minutes Intravenous Every 8 hours 08/08/19 1121 08/10/19 1029   08/08/19 1000  cefTRIAXone (ROCEPHIN) 2 g in sodium chloride 0.9 % 100 mL IVPB  Status:  Discontinued     2 g 200 mL/hr over 30 Minutes Intravenous Every 24 hours 08/07/19 1003 08/07/19 1003   08/08/19 1000  azithromycin (ZITHROMAX) 500 mg in sodium chloride 0.9 % 250 mL IVPB  Status:  Discontinued     500 mg 250 mL/hr over 60 Minutes Intravenous Every 24 hours 08/07/19 1003 08/07/19 1003   08/07/19 1600  doxycycline  (VIBRAMYCIN) 100 mg in sodium chloride 0.9 % 250 mL IVPB  Status:  Discontinued     100 mg 125 mL/hr over 120 Minutes Intravenous Every 12 hours 08/07/19 1515 08/11/19 1003   08/07/19 0700  cefTRIAXone (ROCEPHIN) 2 g in sodium chloride 0.9 % 100 mL IVPB  Status:  Discontinued     2 g 200 mL/hr over 30 Minutes Intravenous Every 24 hours 08/07/19 0649 08/08/19 1121   08/07/19 0700  azithromycin (ZITHROMAX) 500 mg in sodium chloride 0.9 % 250 mL IVPB  Status:  Discontinued     500 mg 250 mL/hr over 60 Minutes Intravenous Every 24 hours 08/07/19 0649 08/07/19 1426      Subjective:  Patient seen and examined the bedside this morning.  Hemodynamically stable.  Waiting for EGD.  Respiratory status stable.  Objective: Vitals:   08/12/19 1100 08/12/19 1224 08/12/19 1235 08/12/19 1240  BP:  (!) 117/58 (!) 148/57   Pulse:  82 78 88  Resp:  14 19 20   Temp:  97.9 F (36.6 C)    TempSrc:  Axillary    SpO2:  96% 96% 94%  Weight: 62.6 kg     Height: 5\' 3"  (1.6 m)       Intake/Output Summary (Last 24 hours) at 08/12/2019 1348 Last data filed at 08/12/2019 1206 Gross per 24 hour  Intake 820 ml  Output -  Net 820 ml   Filed Weights   08/11/19 0500 08/12/19 0454 08/12/19 1100  Weight: 35.4 kg 34.8 kg 62.6 kg    Examination:  General exam: Not in distress, pleasant elderly female HEENT:PERRL,Oral mucosa moist, Ear/Nose normal on gross exam Respiratory system: Bilateral decreased air entry on the bases Cardiovascular system: S1 & S2 heard, RRR. No JVD, murmurs, rubs, gallops or clicks.  Trace pedal edema. Gastrointestinal system: Abdomen is nondistended, soft and nontender. No organomegaly or masses felt. Normal bowel sounds heard. Central nervous system: Alert and oriented. No focal neurological deficits. Extremities: Mild bilateral lower extremity edema, no clubbing ,no cyanosis, distal peripheral pulses palpable. Skin: No rashes, lesions or ulcers,no icterus ,no pallor    Data  Reviewed: I have personally reviewed following labs and imaging studies  CBC: Recent Labs  Lab 08/07/19 0710  08/08/19 0643 08/09/19 0732 08/10/19 0401 08/11/19 0049 08/12/19 0912  WBC 10.5  --  12.2* 10.3 9.3 9.4 9.8  NEUTROABS 9.6*  --   --  8.5* 7.2 7.3 8.0*  HGB 9.8*   < > 8.1* 8.6* 8.9* 9.3* 9.7*  HCT 33.3*   < > 26.0* 28.3* 28.5* 29.8* 30.5*  MCV 94.3  --  91.2 91.0 89.6 89.0 88.7  PLT 244  --  168 228 258 325 348   < > = values in this interval not displayed.   Basic Metabolic Panel: Recent Labs  Lab 08/07/19 0710 08/07/19 0919 08/08/19 0643 08/09/19 0732 08/10/19 0401 08/10/19 1850 08/12/19 0912  NA 141 142 140 140 142  --  142  K 3.5 3.1* 3.5 3.6 3.8  --  2.8*  CL 103  --  108 107 108  --  105  CO2 27  --  25 24 26   --  28  GLUCOSE 158*  --  94 105* 94  --  102*  BUN 14  --  16 8 5*  --  <5*  CREATININE 0.89  --  0.78 0.70 0.67  --  0.64  CALCIUM 9.3  --  8.0* 8.3* 8.4*  --  8.5*  MG  --   --   --  1.8 1.7 1.7 1.6*   GFR: Estimated Creatinine Clearance: 45.6 mL/min (by C-G formula based on SCr of 0.64 mg/dL). Liver Function Tests: Recent Labs  Lab 08/09/19 0732 08/10/19 0401  AST 15 14*  ALT 12 13  ALKPHOS 58 60  BILITOT 0.9 0.5  PROT 5.5* 5.2*  ALBUMIN 2.6* 2.4*   No results for input(s): LIPASE, AMYLASE in the last 168 hours. No results for input(s): AMMONIA in the last 168 hours. Coagulation Profile: No results for input(s): INR, PROTIME in the last 168 hours. Cardiac Enzymes: No results for input(s): CKTOTAL, CKMB, CKMBINDEX, TROPONINI in the last 168 hours. BNP (last 3 results) Recent Labs    01/12/19 1427  PROBNP 196.0*   HbA1C: No results for input(s): HGBA1C in the last 72 hours. CBG: No results for input(s): GLUCAP in the last 168 hours. Lipid Profile: No results for input(s): CHOL, HDL, LDLCALC, TRIG, CHOLHDL, LDLDIRECT in the last 72 hours. Thyroid Function Tests: No results for input(s): TSH, T4TOTAL, FREET4, T3FREE,  THYROIDAB in the last 72 hours. Anemia Panel: No results for input(s): VITAMINB12, FOLATE, FERRITIN, TIBC, IRON, RETICCTPCT in the last 72 hours. Sepsis Labs: Recent Labs  Lab 08/07/19 0709 08/07/19 1020 08/07/19 1130 08/07/19 1844 08/08/19 0643 08/08/19 0853  PROCALCITON  --  2.80  --   --  7.70  --   LATICACIDVEN 3.3*  --  0.9 2.0*  --  1.0    Recent Results (from the past 240 hour(s))  SARS Coronavirus 2 [facility] order, Performed in [facility] hospital lab) Nasopharyngeal Nasopharyngeal Swab     Status: None   Collection Time: 08/07/19  6:10 AM   Specimen: Nasopharyngeal Swab  Result Value Ref Range Status   SARS Coronavirus 2 NEGATIVE NEGATIVE Final    Comment: (NOTE) If result is NEGATIVE SARS-CoV-2 target nucleic acids are NOT DETECTED. The SARS-CoV-2 RNA is generally detectable in upper and lower  respiratory specimens during the acute phase of infection. The lowest  concentration of SARS-CoV-2 viral copies this assay can detect is 250  copies / mL. A negative result does not preclude SARS-CoV-2 infection  and should not be used as the sole basis for treatment or other  patient management decisions.  A negative result may occur with  improper specimen collection / handling, submission of specimen other  than nasopharyngeal swab, presence of viral mutation(s) within the  areas targeted by this assay, and inadequate number of viral copies  (<250 copies / mL). A negative result must be combined with clinical  observations, patient history, and epidemiological information. If result is POSITIVE SARS-CoV-2 target nucleic acids are DETECTED. The SARS-CoV-2 RNA is generally detectable in upper and lower  respiratory specimens dur ing the acute phase of infection.  Positive  results are indicative of active infection with SARS-CoV-2.  Clinical  correlation with patient history and other diagnostic information is  necessary to determine patient infection status.  Positive  results do  not rule out bacterial infection or co-infection with other viruses. If result is PRESUMPTIVE POSTIVE SARS-CoV-2 nucleic acids MAY BE PRESENT.   A presumptive positive result was obtained on the submitted specimen  and confirmed on repeat testing.  While 2019 novel coronavirus  (  SARS-CoV-2) nucleic acids may be present in the submitted sample  additional confirmatory testing may be necessary for epidemiological  and / or clinical management purposes  to differentiate between  SARS-CoV-2 and other Sarbecovirus currently known to infect humans.  If clinically indicated additional testing with an alternate test  methodology [phone]([id number]) is advised. The SARS-CoV-2 RNA is generally  detectable in upper and lower respiratory sp ecimens during the acute  phase of infection. The expected result is Negative. Fact Sheet for Patients:  BoilerBrush.com.cyhttps://www.fda.gov/media/136312/download Fact Sheet for Healthcare Providers: https://pope.com/https://www.fda.gov/media/136313/download This test is not yet approved or cleared by the [place] FDA and has been authorized for detection and/or diagnosis of SARS-CoV-2 by FDA under an Emergency Use Authorization (EUA).  This EUA will remain in effect (meaning this test can be used) for the duration of the COVID-19 declaration under Section 564(b)(1) of the Act, 21 U.S.C. section 360bbb-3(b)(1), unless the authorization is terminated or revoked sooner. Performed at [facility], [address] [place], [place] [address]   Culture, blood (Routine X 2) w Reflex to ID Panel     Status: None   Collection Time: 08/07/19  7:40 AM   Specimen: BLOOD LEFT WRIST  Result Value Ref Range Status   Specimen Description BLOOD LEFT WRIST  Final   Special Requests   Final    BOTTLES DRAWN AEROBIC AND ANAEROBIC Blood Culture results may not be optimal due to an inadequate volume of blood received in culture bottles   Culture   Final    NO GROWTH 5 DAYS Performed at [facility], [address] [place], [place] [address]    Report Status 08/12/2019 FINAL  Final  Culture, blood (Routine X 2) w Reflex to ID Panel     Status: None   Collection Time: 08/07/19  8:23 AM   Specimen: BLOOD RIGHT ARM  Result Value Ref Range Status   Specimen Description BLOOD RIGHT ARM  Final   Special Requests   Final    BOTTLES DRAWN AEROBIC AND ANAEROBIC Blood Culture adequate volume   Culture   Final    NO GROWTH 5 DAYS Performed at [facility], [address] [place], [place] [address]    Report Status 08/12/2019 FINAL  Final  Urine culture     Status: None   Collection Time: 08/07/19  4:00 PM   Specimen: In/Out Cath Urine  Result Value Ref Range Status   Specimen Description IN/OUT CATH URINE  Final   Special Requests   Final    ADDED 0533 08/08/2019 Performed at [facility], [address] [place], [place] [address]    Culture   Final    Multiple bacterial morphotypes present, none predominant. Suggest appropriate recollection if clinically indicated.   Report Status 08/09/2019 FINAL  Final  Culture, sputum-assessment     Status: None   Collection Time: 08/07/19  6:06 PM   Specimen: Sputum  Result Value Ref Range Status   Specimen Description SPUTUM  Final   Special Requests NONE  Final   Sputum evaluation   Final    Sputum specimen not acceptable for testing.  Please recollect.   RESULT CALLED TO, READ BACK BY AND VERIFIED WITH: RN KRISTY H. 1642 [id number] FCP Performed at [facility], [address] [place], [place] [address]    Report Status 08/08/2019 FINAL  Final         Radiology Studies: No results found.  Scheduled Meds: . amLODipine  2.5 mg Oral Daily  . aspirin EC  81 mg Oral Daily  . carvedilol  3.125 mg Oral BID WC  . gabapentin  100 mg Oral TID  . guaiFENesin  600 mg Oral BID  . pantoprazole  40 mg Oral Q0600  . potassium chloride  40 mEq Oral Once  . sacubitril-valsartan  1 tablet Oral BID  .  sertraline  100 mg Oral QHS  . sodium chloride flush  10-40 mL Intracatheter Q12H  . sodium chloride flush  3 mL Intravenous Q12H  . spironolactone  12.5 mg Oral Daily   Continuous Infusions: . ampicillin-sulbactam (UNASYN) IV 3 g (08/12/19 0118)  . magnesium sulfate bolus IVPB    . potassium chloride       LOS: 5 days    Time spent:35 mins. More than 50% of that time was spent in counseling and/or coordination of care.      Burnadette Pop, MD [facility] Pager [phone]  If 7PM-7AM, please contact night-coverage [email or web address] Password [email or web address] 08/12/2019, 1:48 PM

## 2019-08-12 NOTE — Progress Notes (Signed)
Pt A&Ox3, breathing equal and unlabored. Pt denied CP, SOB, palpitations. Skin is warm and Dry. BP= 147/91. POX=100% on 3L. HR=154-166 for 20 mins. NP K. Schorr was paged 3 times and text paged once. No response. Pt is now SR HR =92.

## 2019-08-12 NOTE — Progress Notes (Signed)
K 2.8 and mag 1.6 on AM labs. Patient had episodes of ectopy overnight per prior documentation. Dr. Lanetta Inch notified of this, verbal order for potassium 52meq IV x2 doses and repeat BMP following administration. Procedure bumped to allow for initiation of orders.

## 2019-08-13 ENCOUNTER — Other Ambulatory Visit (HOSPITAL_COMMUNITY): Payer: Medicare Other

## 2019-08-13 LAB — BASIC METABOLIC PANEL
Anion gap: 8 (ref 5–15)
BUN: <5 mg/dL — ABNORMAL LOW (ref 8–23)
CO2: 27 mmol/L (ref 22–32)
Calcium: 8.4 mg/dL — ABNORMAL LOW (ref 8.9–10.3)
Chloride: 107 mmol/L (ref 98–111)
Creatinine, Ser: 0.57 mg/dL (ref 0.44–1.00)
GFR calc Af Amer: >60 mL/min (ref >60)
GFR calc non Af Amer: >60 mL/min (ref >60)
Glucose, Bld: 93 mg/dL (ref 70–99)
Potassium: 3.8 mmol/L (ref 3.5–5.1)
Sodium: 142 mmol/L (ref 135–145)

## 2019-08-13 LAB — MAGNESIUM: Magnesium: 2 mg/dL (ref 1.7–2.4)

## 2019-08-13 MED ORDER — LOPERAMIDE HCL 2 MG PO CAPS: 2.0000 mg | ORAL_CAPSULE | ORAL | 0 refills | Status: DC | PRN

## 2019-08-13 MED ORDER — GUAIFENESIN-DM 100-10 MG/5ML PO SYRP: 10.0000 mL | ORAL_SOLUTION | Freq: Four times a day (QID) | ORAL | 1 refills | Status: DC | PRN

## 2019-08-13 MED ORDER — LOPERAMIDE HCL 2 MG PO CAPS: 2.0000 mg | ORAL_CAPSULE | ORAL | Status: DC | PRN

## 2019-08-13 NOTE — Discharge Summary (Signed)
Physician Discharge Summary  Julie Rice TMH:[id number] DOB: [birth year] DOA: 08/07/2019  PCP: [name], NP  Admit date: 08/07/2019 Discharge date: 08/13/2019  Admitted From: Home Disposition:  Home  Discharge Condition:Stable CODE STATUS:FULL Diet recommendation: Soft diet  Brief/Interim Summary:  Patient is 83 year old female with history of hypertension, hyperlipidemia, systolic CHF, fibromyalgia, anemia, esophageal stricture, large symptomatic hiatal hernia status post fundoplication, chronic dysphagia, asthma/COPD on oxygen at 2 L at home, CAD, TIA, bipolar disorder who presented on 08/07/2019 with complaint of shortness of breath, chest pain.  On arrival she was found to be hypoxic at 70% on 3 L.  Chest x-ray done on presentation showed extensive airspace disease in the left upper and lower lobe concerning for multilobar pneumonia from aspiration.  Covid-19 screening was negative.  Patient was started on broad-spectrum antibiotics.   GI consulted.  Underwent balloon dilatation and butox injection  by GI.  Currently her respiratory status has improved and she is on baseline oxygen requirement.  She has multiple comorbidities, lives alone at home.  Physical therapy evaluated her and recommended home health on discharge.  She is hemodynamically stable for discharge to home today but she has high risks of readmission in the near future.  Following problems were addressed during her hospitalization:  Acute on chronic hypoxic respiratory failure: Secondary to aspiration pneumonia.  History of esophageal stricture.  Suspicious history of multifocal pneumonia most likely secondary to aspiration.  Currently she is on 3 L of oxygen per minute.  She is on 2 L of oxygen at baseline at home.Antibiotics changed to oral and she completed the course.  Chronic dysphagia/esophageal stricture: Reported of nausea and vomiting at home.  She was following with Dr. [name], GI as an outpatient.  Barium  swallow study showed significant narrowing of the upper part of the esophagus but no esophageal stricture.  Underwent EGD with finding of benign-appearing esophageal stenosis. Status post fundoplication, Botox injection, balloon dilation.  She should follow-up with GI in 4 weeks.Currently on soft diet.  She should take her medicines and food on sitting position, small bites at a time.  Chest pain/elevated troponin: Currently chest pain-free.  EKG did not show any ischemic changes.  Most likely due to demand ischemia.  Chronic systolic congestive heart failure: Currently euvolemic.  Continue carvedilol, Entresto, Aldactone.  Echocardiogram done on 9/20 showed left ventricular ejection fraction of 35 to 40%, pseudonormalization pattern of left ventricular diastolic filling.She should follow-up with her cardiologist as an outpatient  Hypertension: Continue current regimen.  Blood pressure stable  Chronic pain syndrome/fibromyalgia: Continue supportive care  Bipolar disorder/anxiety: Continue Zoloft, clonazepam  Debility/deconditioning: Patient lives alone at home.  Does not use walker or cane on ambulation.  PT recommended home health.  We will maximize home health because he stays alone and she has multiple comorbidities.  Discharge Diagnoses:  Active Problems:   Hypokalemia   Chest pain   Community acquired pneumonia of left lung   Prolonged QT interval   Sepsis ([place])   Acute on chronic respiratory failure with hypoxia [facility])    Discharge Instructions  Discharge Instructions    Diet - low sodium heart healthy   Complete by: As directed    Discharge instructions   Complete by: As directed    1)Please follow up with your PCP in a week. Do a CBC and BMP test during the follow up. 2) take prescribed medications as instructed. 3)Follow up with home health. 4)Always take your medicines and food sitting upright.  Take  soft diet.  Take small bites 5)Follow up with gastroenterology in  4 weeks.  Name and number provided has been attached. 6)Follow up with cardiology as an outpatient.   Increase activity slowly   Complete by: As directed      Allergies as of 08/13/2019      Reactions   Morphine Other (See Comments)   Flushing, rash, itching   Tape Other (See Comments)   Band aides, adhesive tape Redness and pulls skin off      Medication List    TAKE these medications   acetaminophen 325 MG tablet Commonly known as: TYLENOL Take 2 tablets (650 mg total) by mouth every 4 (four) hours as needed for headache or mild pain.   albuterol 108 (90 Base) MCG/ACT inhaler Commonly known as: VENTOLIN HFA Inhale 2 puffs into the lungs every 6 (six) hours as needed for wheezing or shortness of breath.   amLODipine 2.5 MG tablet Commonly known as: NORVASC Take 2.5 mg by mouth daily.   aspirin 81 MG EC tablet Take 1 tablet (81 mg total) by mouth daily.   carvedilol 3.125 MG tablet Commonly known as: COREG TAKE 1 TABLET BY MOUTH TWICE A DAY WITH A MEAL   clonazePAM 0.5 MG tablet Commonly known as: KLONOPIN TAKE 1 TABLET BY MOUTH TWICE A DAY AS NEEDED FOR ANXIETY   ferrous sulfate 325 (65 FE) MG tablet Take 1 tablet (325 mg total) by mouth daily with breakfast.   gabapentin 100 MG capsule Commonly known as: NEURONTIN TAKE 1 CAPSULE BY MOUTH THREE TIMES A DAY   guaiFENesin-dextromethorphan 100-10 MG/5ML syrup Commonly known as: ROBITUSSIN DM Take 10 mLs by mouth every 6 (six) hours as needed for cough.   meloxicam 15 MG tablet Commonly known as: MOBIC Take 1 tablet (15 mg total) by mouth daily.   multivitamin tablet Take 1 tablet by mouth daily. Vitamin supplement.   pantoprazole 40 MG tablet Commonly known as: PROTONIX Take 1 tablet (40 mg total) by mouth 2 (two) times daily.   sacubitril-valsartan 24-26 MG Commonly known as: ENTRESTO Take 1 tablet by mouth 2 (two) times daily.   sertraline 100 MG tablet Commonly known as: ZOLOFT TAKE 1 TABLET BY  MOUTH EVERYDAY AT BEDTIME   spironolactone 25 MG tablet Commonly known as: ALDACTONE Take 0.5 tablets (12.5 mg total) by mouth daily.   sucralfate 1 g tablet Commonly known as: CARAFATE Take 1 tablet (1 g total) by mouth 4 (four) times daily. Dissolve tablet in 10 ml warm water What changed:   when to take this  reasons to take this   tetrahydrozoline 0.05 % ophthalmic solution Place 1 drop into both eyes daily.      Follow-up Information    Shirline Frees, NP. Schedule an appointment as soon as possible for a visit in 1 week(s).   Specialty: Family Medicine Contact information: [address] [place] [address] [phone]        Wendall Stade, MD. Schedule an appointment as soon as possible for a visit in 4 week(s).   Specialty: Cardiology Contact information: [address] [place] [address] [phone]        Lynann Bologna, MD. Schedule an appointment as soon as possible for a visit in 4 week(s).   Specialties: Gastroenterology, Internal Medicine Contact information: [address] [place] [address] [phone]          Allergies  Allergen Reactions  . Morphine Other (See  Comments)    Flushing, rash, itching  . Tape Other (See Comments)    Band aides, adhesive tape Redness and pulls skin off    Consultations:  GI   Procedures/Studies: Ct Angio Chest Pe W And/or Wo Contrast  Result Date: 07/22/2019 CLINICAL DATA:  Shortness of breath and chest pain EXAM: CT ANGIOGRAPHY CHEST WITH CONTRAST TECHNIQUE: Multidetector CT imaging of the chest was performed using the standard protocol during bolus administration of intravenous contrast. Multiplanar CT image reconstructions and MIPs were obtained to evaluate the vascular anatomy. CONTRAST:  OMNIPAQUE IOHEXOL 350 MG/ML SOLN COMPARISON:  Chest radiograph July 22, 2019 and CT angiogram chest February 04, 2019; high-resolution chest CT  June 01, 2019 FINDINGS: Cardiovascular: There is no demonstrable pulmonary embolus. There is no thoracic aortic aneurysm or dissection. The visualized great vessels show occasional foci of calcification. There is aortic atherosclerosis. There are scattered foci of coronary artery calcification. There is no pericardial effusion or pericardial thickening. The main pulmonary outflow tract measures 3.1 cm, prominent. Mediastinum/Nodes: No thyroid lesions are evident. There is a right hilar lymph node measuring 1.4 x 1.4 cm. There is subcarinal adenopathy measuring 2.8 x 2.1 cm. There is an aortopulmonary window lymph node measuring 1.6 x 1.2 cm. Esophagus remains distended with fluid and air throughout the esophagus. There is a focal hiatal hernia. Lungs/Pleura: There is underlying centrilobular emphysematous change. There are focal pleural effusions bilaterally with atelectatic change in each lung base. There is septal thickening in the lung bases with areas of reticulonodular interstitial prominence in the lower lobes. There are scattered areas of peribronchovascular ground-glass type appearance, similar to prior studies. Scattered subcentimeter pulmonary nodular lesions overall remain stable. Note that a 6 mm nodular opacity in the left upper lobe posterior segment noted previously is no longer evident. Upper Abdomen: There is reflux of contrast into the inferior vena cava and hepatic veins. There is upper abdominal aortic atherosclerosis. Visualized upper abdominal structures otherwise appear unremarkable. Musculoskeletal: There is extensive arthropathy throughout the lower thoracic region. No blastic or lytic bone lesions are evident. Postoperative change in upper lumbar spine. No chest wall lesions are evident. Review of the MIP images confirms the above findings. IMPRESSION: 1. No demonstrable pulmonary embolus. No thoracic aortic aneurysm or dissection. There is aortic atherosclerosis as well as foci of coronary  artery calcification. 2. Prominence of the main pulmonary outflow tract, a finding felt to indicate a degree of pulmonary arterial hypertension. 3. Areas of adenopathy noted with increased lymph node prominence compared to prior studies. Etiology for this adenopathy uncertain. Neoplastic etiology cannot be excluded. 4. Small pleural effusions bilaterally with bibasilar atelectasis. Underlying centrilobular emphysematous change. Areas of reticulonodular interstitial prominence and patchy peribronchovascular ground-glass type appearance. Scattered subcentimeter pulmonary nodules remain overall stable. No frank consolidation. 5. Hiatal hernia present. Esophagus appears distended containing fluid and air. Suspect esophageal motility disorder. 6. Reflux of contrast into the inferior vena cava and hepatic veins likely indicates increased right heart pressure. Aortic Atherosclerosis (ICD10-I70.0) and Emphysema (ICD10-J43.9). Electronically Signed   By: Bretta Bang III M.D.   On: 07/22/2019 14:39   Dg Chest Portable 1 View  Result Date: 08/07/2019 CLINICAL DATA:  Recurrent chest pain, shortness of breath EXAM: PORTABLE CHEST 1 VIEW COMPARISON:  07/22/2019 FINDINGS: Extensive left upper lobe and left lower lobe airspace disease. Mild right lower lobe airspace disease. No right pleural effusion. Possible trace left pleural effusion. No pneumothorax. Stable cardiomegaly. No acute osseous abnormality. Moderate osteoarthritis of the left glenohumeral  joint. IMPRESSION: Extensive airspace disease in the left upper lobe and left lower lobe and to a much lesser extent right lower lobe most concerning for multilobar pneumonia. Electronically Signed   By: Elige Ko   On: 08/07/2019 06:26   Dg Chest Portable 1 View  Result Date: 07/22/2019 CLINICAL DATA:  Shortness of breath. EXAM: PORTABLE CHEST 1 VIEW COMPARISON:  Mar 09, 2019 FINDINGS: Enlarged cardiac silhouette. Calcific atherosclerotic disease of the aorta. Lower  lobe predominant interstitial and alveolar opacities. Osseous structures are without acute abnormality. Soft tissues are grossly normal. IMPRESSION: Lower lobe predominant interstitial and alveolar opacities may represent mixed pattern pulmonary edema. Electronically Signed   By: Ted Mcalpine M.D.   On: 07/22/2019 10:25   Vas [place] Lower Extremity Venous (dvt)  Result Date: 07/25/2019  Lower Venous Study Indications: Swelling.  Comparison Study: No prior study on file Performing Technologist: Sherren Kerns RVS  Examination Guidelines: A complete evaluation includes B-mode imaging, spectral Doppler, color Doppler, and power Doppler as needed of all accessible portions of each vessel. Bilateral testing is considered an integral part of a complete examination. Limited examinations for reoccurring indications may be performed as noted.  +---------+---------------+---------+-----------+----------+--------------+ RIGHT    CompressibilityPhasicitySpontaneityPropertiesThrombus Aging +---------+---------------+---------+-----------+----------+--------------+ CFV      Full           Yes      Yes                                 +---------+---------------+---------+-----------+----------+--------------+ SFJ      Full                                                        +---------+---------------+---------+-----------+----------+--------------+ FV Prox  Full                                                        +---------+---------------+---------+-----------+----------+--------------+ FV Mid   Full                                                        +---------+---------------+---------+-----------+----------+--------------+ FV DistalFull                                                        +---------+---------------+---------+-----------+----------+--------------+ PFV      Full                                                         +---------+---------------+---------+-----------+----------+--------------+ POP      Full           Yes      Yes                                 +---------+---------------+---------+-----------+----------+--------------+  PTV      Full                                                        +---------+---------------+---------+-----------+----------+--------------+ PERO     Full                                                        +---------+---------------+---------+-----------+----------+--------------+   +---------+---------------+---------+-----------+----------+--------------+ LEFT     CompressibilityPhasicitySpontaneityPropertiesThrombus Aging +---------+---------------+---------+-----------+----------+--------------+ CFV      Full           Yes      Yes                                 +---------+---------------+---------+-----------+----------+--------------+ SFJ      Full                                                        +---------+---------------+---------+-----------+----------+--------------+ FV Prox  Full                                                        +---------+---------------+---------+-----------+----------+--------------+ FV Mid   Full                                                        +---------+---------------+---------+-----------+----------+--------------+ FV DistalFull                                                        +---------+---------------+---------+-----------+----------+--------------+ PFV      Full                                                        +---------+---------------+---------+-----------+----------+--------------+ POP      Full           Yes      Yes                                 +---------+---------------+---------+-----------+----------+--------------+ PTV      Full                                                         +---------+---------------+---------+-----------+----------+--------------+  PERO     Full                                                        +---------+---------------+---------+-----------+----------+--------------+     Summary: Right: There is no evidence of deep vein thrombosis in the lower extremity. Left: There is no evidence of deep vein thrombosis in the lower extremity.  *See table(s) above for measurements and observations. Electronically signed by Fabienne Brunsharles Fields MD on 07/25/2019 at 11:01:14 AM.    Final    Dg Esophagus W Single Cm (sol Or Thin Ba)  Result Date: 08/10/2019 CLINICAL DATA:  Patient presents with difficulty swallowing with nausea vomiting. She has a long history of esophageal problems. She underwent a fundoplication in 2017. She has had repeated endoscopies, last performed in November 2019 with balloon dilatation. Small hiatal hernia and esophageal diverticulum noted at that study. EXAM: ESOPHOGRAM/BARIUM SWALLOW TECHNIQUE: Single contrast examination was performed using  thin barium. FLUOROSCOPY TIME:  Fluoroscopy Time:  1 minutes and 48 seconds. Radiation Exposure Index (if provided by the fluoroscopic device): 15.7 mGy Number of Acquired Spot Images: 1 series.  Thirty-three images. COMPARISON:  chest radiograph, 08/07/2019. CT angiogram of the chest, 07/22/2019 FINDINGS: Pharyngeal swallowing function is unremarkable with no evidence of aspiration or penetration. No visualized mass or diverticulum. Esophagus is mildly distended. There is a hiatal hernia with a nonobstructing mucosal ring at the junction of the distal esophagus and hiatal hernia. There is no significant narrowing of the esophagus. No mass or mucosal abnormality. Esophagus shows abnormal motility with tertiary contractions and significant distal barium stasis. No reflux was documented during the exam. Below the hiatal hernia, just below the level of the hemidiaphragm, there is a significant narrowing of the  stomach, extending for approximately 4 cm in length. This area of narrowing causes significant holdup to passage of barium. Below the narrowed proximal stomach, the remainder of the stomach was mostly decompressed, but normal in configuration with a grossly normal mucosal fold pattern. Barium was seen extending into the duodenum and proximal small bowel. IMPRESSION: 1. Significant narrowing of the proximal stomach just below a small hiatal hernia, just below the level of the hemidiaphragm. This may reflect the patient's fundoplication. It causes significant holdup to passage of the barium with distal barium stasis. 2. No evidence of an esophageal mass or stricture. No esophageal inflammation. 3. Abnormal esophageal motility with tertiary contractions. Electronically Signed   By: Amie Portlandavid  Ormond M.D.   On: 08/10/2019 10:11       Subjective:  Patient seen and examined at bedside this morning.  Hemodynamically stable.  Currently on 2 to 3 L of oxygen per minute, near baseline.  Complains of some cough but doing okay. Stable for discharge to home.   Discharge Exam: Vitals:   08/12/19 1546 08/13/19 0751  BP: (!) 163/74 (!) 164/82  Pulse: 86 98  Resp: 16 18  Temp: 98.3 F (36.8 C) 97.7 F (36.5 C)  SpO2: 100% 99%   Vitals:   08/12/19 1240 08/12/19 1546 08/13/19 0418 08/13/19 0751  BP:  (!) 163/74  (!) 164/82  Pulse: 88 86  98  Resp: 20 16  18   Temp:  98.3 F (36.8 C)  97.7 F (36.5 C)  TempSrc:  Oral  Oral  SpO2: 94% 100%  99%  Weight:  29.8 kg   Height:        General: Pt is alert, awake, not in acute distress Cardiovascular: RRR, S1/S2 +, no rubs, no gallops Respiratory:B/L decreased air entry Abdominal: Soft, NT, ND, bowel sounds + Extremities: no edema, no cyanosis    The results of significant diagnostics from this hospitalization (including imaging, microbiology, ancillary and laboratory) are listed below for reference.     Microbiology: Recent Results (from the past  240 hour(s))  SARS Coronavirus 2 [facility] order, Performed in [facility] hospital lab) Nasopharyngeal Nasopharyngeal Swab     Status: None   Collection Time: 08/07/19  6:10 AM   Specimen: Nasopharyngeal Swab  Result Value Ref Range Status   SARS Coronavirus 2 NEGATIVE NEGATIVE Final    Comment: (NOTE) If result is NEGATIVE SARS-CoV-2 target nucleic acids are NOT DETECTED. The SARS-CoV-2 RNA is generally detectable in upper and lower  respiratory specimens during the acute phase of infection. The lowest  concentration of SARS-CoV-2 viral copies this assay can detect is 250  copies / mL. A negative result does not preclude SARS-CoV-2 infection  and should not be used as the sole basis for treatment or other  patient management decisions.  A negative result may occur with  improper specimen collection / handling, submission of specimen other  than nasopharyngeal swab, presence of viral mutation(s) within the  areas targeted by this assay, and inadequate number of viral copies  (<250 copies / mL). A negative result must be combined with clinical  observations, patient history, and epidemiological information. If result is POSITIVE SARS-CoV-2 target nucleic acids are DETECTED. The SARS-CoV-2 RNA is generally detectable in upper and lower  respiratory specimens dur ing the acute phase of infection.  Positive  results are indicative of active infection with SARS-CoV-2.  Clinical  correlation with patient history and other diagnostic information is  necessary to determine patient infection status.  Positive results do  not rule out bacterial infection or co-infection with other viruses. If result is PRESUMPTIVE POSTIVE SARS-CoV-2 nucleic acids MAY BE PRESENT.   A presumptive positive result was obtained on the submitted specimen  and confirmed on repeat testing.  While 2019 novel coronavirus  (SARS-CoV-2) nucleic acids may be present in the submitted sample  additional confirmatory  testing may be necessary for epidemiological  and / or clinical management purposes  to differentiate between  SARS-CoV-2 and other Sarbecovirus currently known to infect humans.  If clinically indicated additional testing with an alternate test  methodology [phone]) is advised. The SARS-CoV-2 RNA is generally  detectable in upper and lower respiratory sp ecimens during the acute  phase of infection. The expected result is Negative. Fact Sheet for Patients:  BoilerBrush.com.cy Fact Sheet for Healthcare Providers: https://pope.com/ This test is not yet approved or cleared by the [place] FDA and has been authorized for detection and/or diagnosis of SARS-CoV-2 by FDA under an Emergency Use Authorization (EUA).  This EUA will remain in effect (meaning this test can be used) for the duration of the COVID-19 declaration under Section 564(b)(1) of the Act, 21 U.S.C. section 360bbb-3(b)(1), unless the authorization is terminated or revoked sooner. Performed at [facility], [address], [place], [place] [address]   Culture, blood (Routine X 2) w Reflex to ID Panel     Status: None   Collection Time: 08/07/19  7:40 AM   Specimen: BLOOD LEFT WRIST  Result Value Ref Range Status   Specimen Description BLOOD LEFT WRIST  Final  Special Requests   Final    BOTTLES DRAWN AEROBIC AND ANAEROBIC Blood Culture results may not be optimal due to an inadequate volume of blood received in culture bottles   Culture   Final    NO GROWTH 5 DAYS Performed at [facility], [address] [place], [place] [address]    Report Status 08/12/2019 FINAL  Final  Culture, blood (Routine X 2) w Reflex to ID Panel     Status: None   Collection Time: 08/07/19  8:23 AM   Specimen: BLOOD RIGHT ARM  Result Value Ref Range Status   Specimen Description BLOOD RIGHT ARM  Final   Special Requests   Final    BOTTLES DRAWN AEROBIC AND ANAEROBIC Blood  Culture adequate volume   Culture   Final    NO GROWTH 5 DAYS Performed at [facility], [address] [place], [place] [address]    Report Status 08/12/2019 FINAL  Final  Urine culture     Status: None   Collection Time: 08/07/19  4:00 PM   Specimen: In/Out Cath Urine  Result Value Ref Range Status   Specimen Description IN/OUT CATH URINE  Final   Special Requests   Final    ADDED 0533 08/08/2019 Performed at [facility], [address] [place], [place] [address]    Culture   Final    Multiple bacterial morphotypes present, none predominant. Suggest appropriate recollection if clinically indicated.   Report Status 08/09/2019 FINAL  Final  Culture, sputum-assessment     Status: None   Collection Time: 08/07/19  6:06 PM   Specimen: Sputum  Result Value Ref Range Status   Specimen Description SPUTUM  Final   Special Requests NONE  Final   Sputum evaluation   Final    Sputum specimen not acceptable for testing.  Please recollect.   RESULT CALLED TO, READ BACK BY AND VERIFIED WITH: RN KRISTY H. 1642 [id number] FCP Performed at [facility], [address] [place], [place] [address]    Report Status 08/08/2019 FINAL  Final     Labs: BNP (last 3 results) Recent Labs    07/22/19 1021 08/07/19 0710  BNP 723.7* 748.8*   Basic Metabolic Panel: Recent Labs  Lab 08/08/19 0643 08/09/19 0732 08/10/19 0401 08/10/19 1850 08/12/19 0912 08/13/19 0443  NA 140 140 142  --  142 142  K 3.5 3.6 3.8  --  2.8* 3.8  CL 108 107 108  --  105 107  CO2 25 24 26   --  28 27  GLUCOSE 94 105* 94  --  102* 93  BUN 16 8 5*  --  <5* <5*  CREATININE 0.78 0.70 0.67  --  0.64 0.57  CALCIUM 8.0* 8.3* 8.4*  --  8.5* 8.4*  MG  --  1.8 1.7 1.7 1.6* 2.0   Liver Function Tests: Recent Labs  Lab 08/09/19 0732 08/10/19 0401  AST 15 14*  ALT 12 13  ALKPHOS 58 60  BILITOT 0.9 0.5  PROT 5.5* 5.2*  ALBUMIN 2.6* 2.4*   No results for input(s): LIPASE, AMYLASE in the last 168  hours. No results for input(s): AMMONIA in the last 168 hours. CBC: Recent Labs  Lab 08/07/19 0710  08/08/19 0643 08/09/19 0732 08/10/19 0401 08/11/19 0049 08/12/19 0912  WBC 10.5  --  12.2* 10.3 9.3 9.4 9.8  NEUTROABS 9.6*  --   --  8.5* 7.2 7.3 8.0*  HGB 9.8*   < >  8.1* 8.6* 8.9* 9.3* 9.7*  HCT 33.3*   < > 26.0* 28.3* 28.5* 29.8* 30.5*  MCV 94.3  --  91.2 91.0 89.6 89.0 88.7  PLT 244  --  168 228 258 325 348   < > = values in this interval not displayed.   Cardiac Enzymes: No results for input(s): CKTOTAL, CKMB, CKMBINDEX, TROPONINI in the last 168 hours. BNP: Invalid input(s): POCBNP CBG: No results for input(s): GLUCAP in the last 168 hours. D-Dimer No results for input(s): DDIMER in the last 72 hours. Hgb A1c No results for input(s): HGBA1C in the last 72 hours. Lipid Profile No results for input(s): CHOL, HDL, LDLCALC, TRIG, CHOLHDL, LDLDIRECT in the last 72 hours. Thyroid function studies No results for input(s): TSH, T4TOTAL, T3FREE, THYROIDAB in the last 72 hours.  Invalid input(s): FREET3 Anemia work up No results for input(s): VITAMINB12, FOLATE, FERRITIN, TIBC, IRON, RETICCTPCT in the last 72 hours. Urinalysis    Component Value Date/Time   COLORURINE YELLOW 08/07/2019 1615   APPEARANCEUR HAZY (A) 08/07/2019 1615   LABSPEC 1.019 08/07/2019 1615   PHURINE 5.0 08/07/2019 1615   GLUCOSEU NEGATIVE 08/07/2019 1615   HGBUR NEGATIVE 08/07/2019 1615   HGBUR negative 07/19/2009 1509   BILIRUBINUR NEGATIVE 08/07/2019 1615   BILIRUBINUR Negative 07/31/2018 1055   KETONESUR 5 (A) 08/07/2019 1615   PROTEINUR 30 (A) 08/07/2019 1615   UROBILINOGEN 0.2 07/31/2018 1055   UROBILINOGEN 0.2 02/05/2012 0058   NITRITE NEGATIVE 08/07/2019 1615   LEUKOCYTESUR MODERATE (A) 08/07/2019 1615   Sepsis Labs Invalid input(s): PROCALCITONIN,  WBC,  LACTICIDVEN Microbiology Recent Results (from the past 240 hour(s))  SARS Coronavirus 2 [facility] order, Performed in [facility]  hospital lab) Nasopharyngeal Nasopharyngeal Swab     Status: None   Collection Time: 08/07/19  6:10 AM   Specimen: Nasopharyngeal Swab  Result Value Ref Range Status   SARS Coronavirus 2 NEGATIVE NEGATIVE Final    Comment: (NOTE) If result is NEGATIVE SARS-CoV-2 target nucleic acids are NOT DETECTED. The SARS-CoV-2 RNA is generally detectable in upper and lower  respiratory specimens during the acute phase of infection. The lowest  concentration of SARS-CoV-2 viral copies this assay can detect is 250  copies / mL. A negative result does not preclude SARS-CoV-2 infection  and should not be used as the sole basis for treatment or other  patient management decisions.  A negative result may occur with  improper specimen collection / handling, submission of specimen other  than nasopharyngeal swab, presence of viral mutation(s) within the  areas targeted by this assay, and inadequate number of viral copies  (<250 copies / mL). A negative result must be combined with clinical  observations, patient history, and epidemiological information. If result is POSITIVE SARS-CoV-2 target nucleic acids are DETECTED. The SARS-CoV-2 RNA is generally detectable in upper and lower  respiratory specimens dur ing the acute phase of infection.  Positive  results are indicative of active infection with SARS-CoV-2.  Clinical  correlation with patient history and other diagnostic information is  necessary to determine patient infection status.  Positive results do  not rule out bacterial infection or co-infection with other viruses. If result is PRESUMPTIVE POSTIVE SARS-CoV-2 nucleic acids MAY BE PRESENT.   A presumptive positive result was obtained on the submitted specimen  and confirmed on repeat testing.  While 2019 novel coronavirus  (SARS-CoV-2) nucleic acids may be present in the submitted sample  additional confirmatory testing may be necessary for epidemiological  and / or  clinical management  purposes  to differentiate between  SARS-CoV-2 and other Sarbecovirus currently known to infect humans.  If clinically indicated additional testing with an alternate test  methodology [phone]) is advised. The SARS-CoV-2 RNA is generally  detectable in upper and lower respiratory sp ecimens during the acute  phase of infection. The expected result is Negative. Fact Sheet for Patients:  BoilerBrush.com.cy Fact Sheet for Healthcare Providers: https://pope.com/ This test is not yet approved or cleared by the [place] FDA and has been authorized for detection and/or diagnosis of SARS-CoV-2 by FDA under an Emergency Use Authorization (EUA).  This EUA will remain in effect (meaning this test can be used) for the duration of the COVID-19 declaration under Section 564(b)(1) of the Act, 21 U.S.C. section 360bbb-3(b)(1), unless the authorization is terminated or revoked sooner. Performed at [facility], [address] [place], [place] [address]   Culture, blood (Routine X 2) w Reflex to ID Panel     Status: None   Collection Time: 08/07/19  7:40 AM   Specimen: BLOOD LEFT WRIST  Result Value Ref Range Status   Specimen Description BLOOD LEFT WRIST  Final   Special Requests   Final    BOTTLES DRAWN AEROBIC AND ANAEROBIC Blood Culture results may not be optimal due to an inadequate volume of blood received in culture bottles   Culture   Final    NO GROWTH 5 DAYS Performed at [facility], [address], [place], [place] [address]    Report Status 08/12/2019 FINAL  Final  Culture, blood (Routine X 2) w Reflex to ID Panel     Status: None   Collection Time: 08/07/19  8:23 AM   Specimen: BLOOD RIGHT ARM  Result Value Ref Range Status   Specimen Description BLOOD RIGHT ARM  Final   Special Requests   Final    BOTTLES DRAWN AEROBIC AND ANAEROBIC Blood Culture adequate volume   Culture   Final    NO GROWTH 5 DAYS Performed at  [facility], [address] [place], [place] [address]    Report Status 08/12/2019 FINAL  Final  Urine culture     Status: None   Collection Time: 08/07/19  4:00 PM   Specimen: In/Out Cath Urine  Result Value Ref Range Status   Specimen Description IN/OUT CATH URINE  Final   Special Requests   Final    ADDED 0533 08/08/2019 Performed at [facility], [address], [place], [place] [address]    Culture   Final    Multiple bacterial morphotypes present, none predominant. Suggest appropriate recollection if clinically indicated.   Report Status 08/09/2019 FINAL  Final  Culture, sputum-assessment     Status: None   Collection Time: 08/07/19  6:06 PM   Specimen: Sputum  Result Value Ref Range Status   Specimen Description SPUTUM  Final   Special Requests NONE  Final   Sputum evaluation   Final    Sputum specimen not acceptable for testing.  Please recollect.   RESULT CALLED TO, READ BACK BY AND VERIFIED WITH: RN KRISTY H. 1642 [id number] FCP Performed at [facility], [address] [place], [place] [address]    Report Status 08/08/2019 FINAL  Final    Please note: You were cared for by a hospitalist during your hospital stay. Once you are discharged, your primary care physician will handle any further medical issues. Please note that NO REFILLS for any discharge  medications will be authorized once you are discharged, as it is imperative that you return to your primary care physician (or establish a relationship with a primary care physician if you do not have one) for your post hospital discharge needs so that they can reassess your need for medications and monitor your lab values.    Time coordinating discharge: 40 minutes  SIGNED:   Burnadette Pop, MD  [facility] 08/13/2019, 10:26 AM Pager [phone]  If 7PM-7AM, please contact night-coverage [email or web address] Password [email or web address]

## 2019-08-13 NOTE — Plan of Care (Signed)
  Problem: Education: Goal: Knowledge of General Education information will improve Description: Including pain rating scale, medication(s)/side effects and non-pharmacologic comfort measures Outcome: Progressing   Problem: Health Behavior/Discharge Planning: Goal: Ability to manage health-related needs will improve Outcome: Progressing   Problem: Nutrition: Goal: Adequate nutrition will be maintained Outcome: Progressing   Problem: Coping: Goal: Level of anxiety will decrease Outcome: Not Progressing   Problem: Pain Managment: Goal: General experience of comfort will improve Outcome: Not Progressing   

## 2019-08-13 NOTE — Anesthesia Postprocedure Evaluation (Signed)
Anesthesia Post Note  Patient: Katheline C Riggsbee  Procedure(s) Performed: ESOPHAGOGASTRODUODENOSCOPY (EGD) WITH PROPOFOL (N/A ) BOTOX INJECTION (N/A ) BALLOON DILATION (N/A ) BIOPSY     Patient location during evaluation: PACU Anesthesia Type: MAC Level of consciousness: awake and alert Pain management: pain level controlled Vital Signs Assessment: post-procedure vital signs reviewed and stable Respiratory status: spontaneous breathing, nonlabored ventilation, respiratory function stable and patient connected to nasal cannula oxygen Cardiovascular status: stable and blood pressure returned to baseline Postop Assessment: no apparent nausea or vomiting Anesthetic complications: no    Last Vitals:  Vitals:   08/12/19 1546 08/13/19 0751  BP: (!) 163/74 (!) 164/82  Pulse: 86 98  Resp: 16 18  Temp: 36.8 C 36.5 C  SpO2: 100% 99%    Last Pain:  Vitals:   08/13/19 0751  TempSrc: Oral  PainSc: 0-No pain                 Tiajuana Amass

## 2019-08-13 NOTE — TOC Transition Note (Signed)
Transition of Care [facility]) - CM/SW Discharge Note   Patient Details  Name: Julie Rice MRN: [id number] Date of Birth: 1936-01-23  [facility] CM/SW Contact:  Maryclare Labrador, RN Phone Number: 08/13/2019, 11:05 AM   Clinical Narrative:    Pt to discharge home today, pt normally lives alone.  CM spoke with pt via phone call.  Pt is interested in remaining with [facility] for [facility] - pt in agreement with additional [place] modalities ordered - Dorian Pod with [facility] contacted and confirms she can service pt as HH is ordered.  Pt states she has walker and cane in her garage if needed - no equipment recommended by therapy.  Pt informed CM that her sister in law can be with her 24/7 at discharge.  Pt denied barriers with [place] [facility] resources. Pt confirms she has PCP and denied barriers with paying for medications.  Pts daughter in law will transport her home and will bring portable oxygen tank for transport home.  No other CM needs determine - CM signing off   Final next level of care: Home w Home Health Services Barriers to Discharge: Barriers Resolved   Patient Goals and CMS Choice   CMS Medicare.gov Compare Post Acute Care list provided to:: Patient Choice offered to / list presented to : Patient  Discharge Placement                       Discharge Plan and Services                          HH Arranged: RN, PT, OT, Nurse's Aide, Social Work [facility] Agency: [facility] Date [place] Agency Contacted: 08/13/19 Time [place]: 1104 Representative spoke with at [place]: [place] ([place]) Interventions     Readmission Risk Interventions No flowsheet data found.

## 2019-08-16 ENCOUNTER — Telehealth: Payer: Self-pay | Admitting: Cardiology

## 2019-08-16 ENCOUNTER — Other Ambulatory Visit: Payer: Self-pay

## 2019-08-16 ENCOUNTER — Other Ambulatory Visit: Payer: Self-pay | Admitting: Physician Assistant

## 2019-08-16 ENCOUNTER — Encounter (HOSPITAL_COMMUNITY): Payer: Self-pay | Admitting: Emergency Medicine

## 2019-08-16 ENCOUNTER — Encounter: Payer: Self-pay | Admitting: *Deleted

## 2019-08-16 ENCOUNTER — Emergency Department (HOSPITAL_COMMUNITY): Payer: Medicare Other

## 2019-08-16 ENCOUNTER — Inpatient Hospital Stay (HOSPITAL_COMMUNITY)
Admission: EM | Admit: 2019-08-16 | Discharge: 2019-08-23 | DRG: 308 | Disposition: A | Discharge status: Home Health | Payer: Medicare Other | Attending: Internal Medicine | Admitting: Internal Medicine

## 2019-08-16 DIAGNOSIS — I428 Other cardiomyopathies: Secondary | ICD-10-CM | POA: Diagnosis present

## 2019-08-16 DIAGNOSIS — K224 Dyskinesia of esophagus: Secondary | ICD-10-CM | POA: Diagnosis present

## 2019-08-16 DIAGNOSIS — J449 Chronic obstructive pulmonary disease, unspecified: Secondary | ICD-10-CM | POA: Diagnosis present

## 2019-08-16 DIAGNOSIS — F419 Anxiety disorder, unspecified: Secondary | ICD-10-CM | POA: Diagnosis not present

## 2019-08-16 DIAGNOSIS — Z8673 Personal history of transient ischemic attack (TIA), and cerebral infarction without residual deficits: Secondary | ICD-10-CM

## 2019-08-16 DIAGNOSIS — J9621 Acute and chronic respiratory failure with hypoxia: Secondary | ICD-10-CM | POA: Diagnosis not present

## 2019-08-16 DIAGNOSIS — R778 Other specified abnormalities of plasma proteins: Secondary | ICD-10-CM | POA: Diagnosis not present

## 2019-08-16 DIAGNOSIS — Z79899 Other long term (current) drug therapy: Secondary | ICD-10-CM | POA: Diagnosis not present

## 2019-08-16 DIAGNOSIS — D649 Anemia, unspecified: Secondary | ICD-10-CM

## 2019-08-16 DIAGNOSIS — M069 Rheumatoid arthritis, unspecified: Secondary | ICD-10-CM | POA: Diagnosis not present

## 2019-08-16 DIAGNOSIS — I340 Nonrheumatic mitral (valve) insufficiency: Secondary | ICD-10-CM | POA: Diagnosis not present

## 2019-08-16 DIAGNOSIS — R079 Chest pain, unspecified: Secondary | ICD-10-CM

## 2019-08-16 DIAGNOSIS — M797 Fibromyalgia: Secondary | ICD-10-CM | POA: Diagnosis not present

## 2019-08-16 DIAGNOSIS — K449 Diaphragmatic hernia without obstruction or gangrene: Secondary | ICD-10-CM

## 2019-08-16 DIAGNOSIS — M170 Bilateral primary osteoarthritis of knee: Secondary | ICD-10-CM | POA: Diagnosis not present

## 2019-08-16 DIAGNOSIS — I429 Cardiomyopathy, unspecified: Secondary | ICD-10-CM

## 2019-08-16 DIAGNOSIS — K219 Gastro-esophageal reflux disease without esophagitis: Secondary | ICD-10-CM | POA: Diagnosis present

## 2019-08-16 DIAGNOSIS — I2510 Atherosclerotic heart disease of native coronary artery without angina pectoris: Secondary | ICD-10-CM | POA: Diagnosis present

## 2019-08-16 DIAGNOSIS — I248 Other forms of acute ischemic heart disease: Secondary | ICD-10-CM | POA: Diagnosis present

## 2019-08-16 DIAGNOSIS — Z96651 Presence of right artificial knee joint: Secondary | ICD-10-CM | POA: Diagnosis present

## 2019-08-16 DIAGNOSIS — E785 Hyperlipidemia, unspecified: Secondary | ICD-10-CM | POA: Diagnosis present

## 2019-08-16 DIAGNOSIS — I110 Hypertensive heart disease with heart failure: Secondary | ICD-10-CM | POA: Diagnosis not present

## 2019-08-16 DIAGNOSIS — M19041 Primary osteoarthritis, right hand: Secondary | ICD-10-CM | POA: Diagnosis not present

## 2019-08-16 DIAGNOSIS — R112 Nausea with vomiting, unspecified: Secondary | ICD-10-CM | POA: Diagnosis not present

## 2019-08-16 DIAGNOSIS — I472 Ventricular tachycardia: Secondary | ICD-10-CM | POA: Diagnosis not present

## 2019-08-16 DIAGNOSIS — R9431 Abnormal electrocardiogram [ECG] [EKG]: Secondary | ICD-10-CM | POA: Diagnosis present

## 2019-08-16 DIAGNOSIS — I4891 Unspecified atrial fibrillation: Secondary | ICD-10-CM | POA: Diagnosis not present

## 2019-08-16 DIAGNOSIS — M810 Age-related osteoporosis without current pathological fracture: Secondary | ICD-10-CM | POA: Diagnosis not present

## 2019-08-16 DIAGNOSIS — Z20828 Contact with and (suspected) exposure to other viral communicable diseases: Secondary | ICD-10-CM | POA: Diagnosis not present

## 2019-08-16 DIAGNOSIS — M545 Low back pain: Secondary | ICD-10-CM | POA: Diagnosis not present

## 2019-08-16 DIAGNOSIS — M8580 Other specified disorders of bone density and structure, unspecified site: Secondary | ICD-10-CM | POA: Diagnosis not present

## 2019-08-16 DIAGNOSIS — Z90710 Acquired absence of both cervix and uterus: Secondary | ICD-10-CM

## 2019-08-16 DIAGNOSIS — I5023 Acute on chronic systolic (congestive) heart failure: Secondary | ICD-10-CM | POA: Diagnosis present

## 2019-08-16 DIAGNOSIS — J45909 Unspecified asthma, uncomplicated: Secondary | ICD-10-CM | POA: Diagnosis present

## 2019-08-16 DIAGNOSIS — I447 Left bundle-branch block, unspecified: Secondary | ICD-10-CM | POA: Diagnosis not present

## 2019-08-16 DIAGNOSIS — R0602 Shortness of breath: Secondary | ICD-10-CM | POA: Diagnosis not present

## 2019-08-16 DIAGNOSIS — F319 Bipolar disorder, unspecified: Secondary | ICD-10-CM | POA: Diagnosis not present

## 2019-08-16 DIAGNOSIS — D638 Anemia in other chronic diseases classified elsewhere: Secondary | ICD-10-CM | POA: Diagnosis present

## 2019-08-16 DIAGNOSIS — D509 Iron deficiency anemia, unspecified: Secondary | ICD-10-CM | POA: Diagnosis present

## 2019-08-16 DIAGNOSIS — I361 Nonrheumatic tricuspid (valve) insufficiency: Secondary | ICD-10-CM | POA: Diagnosis not present

## 2019-08-16 DIAGNOSIS — I509 Heart failure, unspecified: Secondary | ICD-10-CM | POA: Diagnosis not present

## 2019-08-16 DIAGNOSIS — K222 Esophageal obstruction: Secondary | ICD-10-CM

## 2019-08-16 DIAGNOSIS — I088 Other rheumatic multiple valve diseases: Secondary | ICD-10-CM | POA: Diagnosis not present

## 2019-08-16 DIAGNOSIS — E876 Hypokalemia: Secondary | ICD-10-CM | POA: Diagnosis not present

## 2019-08-16 DIAGNOSIS — Z7982 Long term (current) use of aspirin: Secondary | ICD-10-CM | POA: Diagnosis not present

## 2019-08-16 DIAGNOSIS — I5032 Chronic diastolic (congestive) heart failure: Secondary | ICD-10-CM

## 2019-08-16 DIAGNOSIS — I480 Paroxysmal atrial fibrillation: Secondary | ICD-10-CM | POA: Diagnosis present

## 2019-08-16 DIAGNOSIS — E038 Other specified hypothyroidism: Secondary | ICD-10-CM | POA: Diagnosis not present

## 2019-08-16 DIAGNOSIS — G8929 Other chronic pain: Secondary | ICD-10-CM | POA: Diagnosis not present

## 2019-08-16 DIAGNOSIS — I5022 Chronic systolic (congestive) heart failure: Secondary | ICD-10-CM | POA: Diagnosis not present

## 2019-08-16 DIAGNOSIS — I5030 Unspecified diastolic (congestive) heart failure: Secondary | ICD-10-CM

## 2019-08-16 DIAGNOSIS — I083 Combined rheumatic disorders of mitral, aortic and tricuspid valves: Secondary | ICD-10-CM | POA: Diagnosis not present

## 2019-08-16 DIAGNOSIS — M19042 Primary osteoarthritis, left hand: Secondary | ICD-10-CM | POA: Diagnosis not present

## 2019-08-16 DIAGNOSIS — J9611 Chronic respiratory failure with hypoxia: Secondary | ICD-10-CM | POA: Diagnosis present

## 2019-08-16 DIAGNOSIS — I10 Essential (primary) hypertension: Secondary | ICD-10-CM | POA: Diagnosis present

## 2019-08-16 DIAGNOSIS — Z9981 Dependence on supplemental oxygen: Secondary | ICD-10-CM

## 2019-08-16 DIAGNOSIS — E039 Hypothyroidism, unspecified: Secondary | ICD-10-CM | POA: Diagnosis present

## 2019-08-16 LAB — CBC WITH DIFFERENTIAL/PLATELET
Abs Immature Granulocytes: 0.02 10*3/uL (ref 0.00–0.07)
Basophils Absolute: 0.1 10*3/uL (ref 0.0–0.1)
Basophils Relative: 1 %
Eosinophils Absolute: 0.3 10*3/uL (ref 0.0–0.5)
Eosinophils Relative: 3 %
HCT: 28.1 % — ABNORMAL LOW (ref 36.0–46.0)
Hemoglobin: 9 g/dL — ABNORMAL LOW (ref 12.0–15.0)
Immature Granulocytes: 0 %
Lymphocytes Relative: 21 %
Lymphs Abs: 2 10*3/uL (ref 0.7–4.0)
MCH: 29 pg (ref 26.0–34.0)
MCHC: 32 g/dL (ref 30.0–36.0)
MCV: 90.6 fL (ref 80.0–100.0)
Monocytes Absolute: 0.4 10*3/uL (ref 0.1–1.0)
Monocytes Relative: 4 %
Neutro Abs: 7 10*3/uL (ref 1.7–7.7)
Neutrophils Relative %: 71 %
Platelets: 305 10*3/uL (ref 150–400)
RBC: 3.1 MIL/uL — ABNORMAL LOW (ref 3.87–5.11)
RDW: 16 % — ABNORMAL HIGH (ref 11.5–15.5)
WBC: 9.7 10*3/uL (ref 4.0–10.5)
nRBC: 0 % (ref 0.0–0.2)

## 2019-08-16 LAB — BASIC METABOLIC PANEL
Anion gap: 10 (ref 5–15)
BUN: 11 mg/dL (ref 8–23)
CO2: 26 mmol/L (ref 22–32)
Calcium: 8.6 mg/dL — ABNORMAL LOW (ref 8.9–10.3)
Chloride: 103 mmol/L (ref 98–111)
Creatinine, Ser: 0.85 mg/dL (ref 0.44–1.00)
GFR calc Af Amer: >60 mL/min (ref >60)
GFR calc non Af Amer: >60 mL/min (ref >60)
Glucose, Bld: 103 mg/dL — ABNORMAL HIGH (ref 70–99)
Potassium: 3.8 mmol/L (ref 3.5–5.1)
Sodium: 139 mmol/L (ref 135–145)

## 2019-08-16 LAB — TROPONIN I (HIGH SENSITIVITY)
Troponin I (High Sensitivity): 35 ng/L — ABNORMAL HIGH (ref <18)
Troponin I (High Sensitivity): 32 ng/L — ABNORMAL HIGH (ref <18)

## 2019-08-16 LAB — MAGNESIUM: Magnesium: 1.6 mg/dL — ABNORMAL LOW (ref 1.7–2.4)

## 2019-08-16 MED ORDER — HEPARIN (PORCINE) 25000 UT/250ML-% IV SOLN
1300.0000 [IU]/h | INTRAVENOUS | Status: DC
  Administered 2019-08-17: 1150 [IU]/h via INTRAVENOUS
  Administered 2019-08-17: 900 [IU]/h via INTRAVENOUS
  Filled 2019-08-16 (×4): qty 250

## 2019-08-16 MED ORDER — HEPARIN BOLUS VIA INFUSION
3000.0000 [IU] | Freq: Once | INTRAVENOUS | Status: AC
  Administered 2019-08-17: 3000 [IU] via INTRAVENOUS
  Filled 2019-08-16: qty 3000

## 2019-08-16 MED ORDER — MAGNESIUM SULFATE 2 GM/50ML IV SOLN
2.0000 g | Freq: Once | INTRAVENOUS | Status: AC
  Administered 2019-08-16: 2 g via INTRAVENOUS
  Filled 2019-08-16: qty 50

## 2019-08-16 MED ORDER — DILTIAZEM HCL-DEXTROSE 125-5 MG/125ML-% IV SOLN (PREMIX)
5.0000 mg/h | INTRAVENOUS | Status: DC
  Administered 2019-08-16: 5 mg/h via INTRAVENOUS
  Administered 2019-08-17: 12.5 mg/h via INTRAVENOUS
  Filled 2019-08-16 (×3): qty 125

## 2019-08-16 MED ORDER — DILTIAZEM LOAD VIA INFUSION
10.0000 mg | Freq: Once | INTRAVENOUS | Status: AC
  Administered 2019-08-16: 10 mg via INTRAVENOUS
  Filled 2019-08-16 (×2): qty 10

## 2019-08-16 NOTE — Telephone Encounter (Signed)
Pt with HR 60-140 per her home health nurse.  I called pt and she has not filled her meds since discharge but also she is having pain - no CAD on cath but maybe due to possible a fib.  I recommended she come to hospital by EMS for further eval.  She was agreeable.

## 2019-08-16 NOTE — H&P (Signed)
Julie Rice EZM:[id number] DOB: 09/15/1936 DOA: 08/16/2019     PCP: [name], NP   Outpatient Specialists:   CARDS:   Dr.Smith   GI  Dr.  Chryl Heck) Julie Rice    Patient arrived to ER on 08/16/19 at 2025  Patient coming from: home Lives alone     Chief Complaint:   Chief Complaint  Patient presents with  . Chest Pain    HPI: Julie Rice is a 83 y.o. female with medical history significant of hypertension, hyperlipidemia, systolic CHF, fibromyalgia, anemia, esophageal stricture, large symptomatic hiatal hernia status post fundoplication, chronic dysphagia, asthma/COPD on oxygen at 2 L at home, CAD, TIA, bipolar disorder fibromyalgia, rheumatoid arthritis, TIA, chronic urinary incontinence    Presented with   heart rate 60s-140 at home per home health nurse patient has not filled her medications since her discharge with still having some intermittent chest pain and she was told to come to emergency department. She reports while hospitalized she has an episode of tachycardia with HR p to 170's she was given a medicine and then her HR has come down.  She has no known hx of A.fib  She took 2 nitro and 3/24 of aspirin and presented to emergency department Chest pain report on and off for weeks Worse with swallowing She has been swallowing soft food ok Reported burning pain in her chest radiating to her back and arms She felt very weak and fell back wards no LOC No fever no chills Last night she felt sweaty her temp was 99.9  Reports Shortness of breath since last summer But have been getting progressively worse   Initially admitted on 10/3/20200 with shortness of breath and chest pain was found to have lower lobe pneumonia thought to be secondary to aspiration COVID negative treated with broad-spectrum antibiotics there was worrisome for esophageal stenosis GI was consulted and she underwent balloon dilatation and Botox injection Physical therapy seen her and  recommended home health and discharge and she was discharged home on 9 October. EKG showed no evidence of ischemic changes thought to be secondary to demand ischemia  She has known systolic congestive heart failure echo in September showed EF 35-40% plan was for her to follow-up with cardiology  No hx blood in stool No need for blood transfusions No EtOH   Last Cardiac cath was in September 2020  Right dominant coronary anatomy  Widely patent left main  30 to 40% mid LAD  Widely patent circumflex  Luminal irregularities in a dominant right coronary.  Right heart pressures are relatively low.  Pulmonary wedge pressure mean is 2 mmHg. LVEDP 4 mmHg.  Left ventriculography by hand-injection was not helpful.  From images obtained, LV function appears relatively normal.  Infectious risk factors:  Reports shortness of breath, dry   In  ER RAPID COVID TEST in house testing  Pending  Lab Results  Component Value Date   [place] 08/07/2019   [place] NEGATIVE 07/22/2019   [place] NOT DETECTED 04/19/2019   [place] NEGATIVE 03/09/2019    Regarding pertinent Chronic problems:      HTN on Norvasc, Coreg   CHF   combined - last echo September  left ventricular ejection fraction of 35 to 40%, pseudonormalization pattern of left ventricular diastolic filling On Entresto spironolactone       COPD - not   on baseline oxygen  2L,      While in ER: Noted to have A.fib in ER  and LBBB that is old The following Work up has been ordered so far:  Orders Placed This Encounter  Procedures  . DG Chest Portable 1 View  . CBC with Differential  . Basic metabolic panel  . Magnesium  . Consult to hospitalist  ALL PATIENTS BEING ADMITTED/HAVING PROCEDURES NEED COVID-19 SCREENING  . ED EKG  . EKG 12-Lead  . Saline lock IV    Following Medications were ordered in ER: Medications  diltiazem (CARDIZEM) 1 mg/mL load via infusion 10 mg (10 mg Intravenous Bolus from Bag  08/16/19 2144)    And  diltiazem (CARDIZEM) 125 mg in dextrose 5% 125 mL (1 mg/mL) infusion (10 mg/hr Intravenous Rate/Dose Change 08/16/19 2249)        Consult Orders  (From admission, onward)         Start     Ordered   08/16/19 2240  Consult to hospitalist  ALL PATIENTS BEING ADMITTED/HAVING PROCEDURES NEED COVID-19 SCREENING Pg sent by dee  Once    Comments: ALL PATIENTS BEING ADMITTED/HAVING PROCEDURES NEED COVID-19 SCREENING  Provider:  (Not yet assigned)  Question Answer Comment  Place call to: [facility]   Reason for Consult Admit      08/16/19 2239           Significant initial  Findings: Abnormal Labs Reviewed  CBC WITH DIFFERENTIAL/PLATELET - Abnormal; Notable for the following components:      Result Value   RBC 3.10 (*)    Hemoglobin 9.0 (*)    HCT 28.1 (*)    RDW 16.0 (*)    All other components within normal limits  BASIC METABOLIC PANEL - Abnormal; Notable for the following components:   Glucose, Bld 103 (*)    Calcium 8.6 (*)    All other components within normal limits  MAGNESIUM - Abnormal; Notable for the following components:   Magnesium 1.6 (*)    All other components within normal limits  TROPONIN I (HIGH SENSITIVITY) - Abnormal; Notable for the following components:   Troponin I (High Sensitivity) 35 (*)    All other components within normal limits    Otherwise labs showing:    Recent Labs  Lab 08/10/19 0401 08/10/19 1850 08/12/19 0912 08/13/19 0443 08/16/19 2035  NA 142  --  142 142 139  K 3.8  --  2.8* 3.8 3.8  CO2 26  --  28 27 26   GLUCOSE 94  --  102* 93 103*  BUN 5*  --  <5* <5* 11  CREATININE 0.67  --  0.64 0.57 0.85  CALCIUM 8.4*  --  8.5* 8.4* 8.6*  MG 1.7 1.7 1.6* 2.0 1.6*    Cr  stable,    Lab Results  Component Value Date   CREATININE 0.85 08/16/2019   CREATININE 0.57 08/13/2019   CREATININE 0.64 08/12/2019    Recent Labs  Lab 08/10/19 0401  AST 14*  ALT 13  ALKPHOS 60  BILITOT 0.5  PROT 5.2*   ALBUMIN 2.4*   Lab Results  Component Value Date   CALCIUM 8.6 (L) 08/16/2019   PHOS 2.9 07/25/2019      WBC        Component Value Date/Time   WBC 9.7 08/16/2019 2035   ANC    Component Value Date/Time   NEUTROABS 7.0 08/16/2019 2035   ALC No components found for: LYMPHAB    Plt: Lab Results  Component Value Date   PLT 305 08/16/2019    Lactic Acid, Venous  Component Value Date/Time   LATICACIDVEN 1.0 08/08/2019 0853      COVID-19 Labs  No results for input(s): DDIMER, FERRITIN, LDH, CRP in the last 72 hours.  Lab Results  Component Value Date   SARSCOV2NAA NEGATIVE 08/07/2019   SARSCOV2NAA NEGATIVE 07/22/2019   SARSCOV2NAA NOT DETECTED 04/19/2019   SARSCOV2NAA NEGATIVE 03/09/2019     ABG    Component Value Date/Time   PHART 7.423 08/07/2019 0919   PCO2ART 39.8 08/07/2019 0919   PO2ART 65.0 (L) 08/07/2019 0919   HCO3 26.2 08/07/2019 0919   TCO2 27 08/07/2019 0919   O2SAT 93.0 08/07/2019 0919      HG/HCT  Stable     Component Value Date/Time   HGB 9.0 (L) 08/16/2019 2035   HCT 28.1 (L) 08/16/2019 2035      Troponin 35  On 7h was 20 Cardiac Panel (last 3 results) No results for input(s): CKTOTAL, CKMB, TROPONINI, RELINDX in the last 72 hours.     ECG: Ordered Personally reviewed by me showing: HR :  147 Rhythm: A.fib. W RVR BBB,    [id number]    BNP (last 3 results) Recent Labs    07/22/19 1021 08/07/19 0710  BNP 723.7* 748.8*    ProBNP (last 3 results) Recent Labs    01/12/19 1427  PROBNP 196.0*       UA  ordered   Urine analysis:    Component Value Date/Time   COLORURINE YELLOW 08/07/2019 1615   APPEARANCEUR HAZY (A) 08/07/2019 1615   LABSPEC 1.019 08/07/2019 1615   PHURINE 5.0 08/07/2019 1615   GLUCOSEU NEGATIVE 08/07/2019 1615   HGBUR NEGATIVE 08/07/2019 1615   HGBUR negative 07/19/2009 1509   BILIRUBINUR NEGATIVE 08/07/2019 1615   BILIRUBINUR Negative 07/31/2018 1055   KETONESUR 5 (A) 08/07/2019 1615    PROTEINUR 30 (A) 08/07/2019 1615   UROBILINOGEN 0.2 07/31/2018 1055   UROBILINOGEN 0.2 02/05/2012 0058   NITRITE NEGATIVE 08/07/2019 1615   LEUKOCYTESUR MODERATE (A) 08/07/2019 1615       Ordered   CXR - Small bilateral pleural effusions with bibasilar atelectasis versus infiltrate.    ED Triage Vitals  Enc Vitals Group     BP 08/16/19 2034 128/69     Pulse Rate 08/16/19 2034 (!) 103     Resp 08/16/19 2034 14     Temp 08/16/19 2034 98.7 F (37.1 C)     Temp Source 08/16/19 2034 Oral     SpO2 08/16/19 2000 99 %     Weight 08/16/19 2029 65 lb 11.2 oz (29.8 kg)     Height 08/16/19 2029 5\' 3"  (1.6 m)     Head Circumference --      Peak Flow --      Pain Score 08/16/19 2029 4     Pain Loc --      Pain Edu? --      Excl. in GC? --   TMAX(24)@       Latest  Blood pressure 116/74, pulse (!) 105, temperature 98.7 F (37.1 C), temperature source Oral, resp. rate 20, height 5\' 3"  (1.6 m), weight 29.8 kg, SpO2 100 %.    Hospitalist was called for admission for a.fib w RVR    Review of Systems:    Pertinent positives include: chest pain,   Constitutional:  No weight loss, night sweats, Fevers, chills, fatigue, weight loss  HEENT:  No headaches, Difficulty swallowing,Tooth/dental problems,Sore throat,  No sneezing, itching, ear ache, nasal congestion, post nasal drip,  Cardio-vascular:  No Orthopnea, PND, anasarca, dizziness, palpitations.no Bilateral lower extremity swelling  GI:  No heartburn, indigestion, abdominal pain, nausea, vomiting, diarrhea, change in bowel habits, loss of appetite, melena, blood in stool, hematemesis Resp:  no shortness of breath at rest. No dyspnea on exertion, No excess mucus, no productive cough, No non-productive cough, No coughing up of blood.No change in color of mucus.No wheezing. Skin:  no rash or lesions. No jaundice GU:  no dysuria, change in color of urine, no urgency or frequency. No straining to urinate.  No flank pain.   Musculoskeletal:  No joint pain or no joint swelling. No decreased range of motion. No back pain.  Psych:  No change in mood or affect. No depression or anxiety. No memory loss.  Neuro: no localizing neurological complaints, no tingling, no weakness, no double vision, no gait abnormality, no slurred speech, no confusion  All systems reviewed and apart from HOPI all are negative  Past Medical History:   Past Medical History:  Diagnosis Date  . Anemia   . Anxiety   . Bipolar disorder (HCC)   . Blood transfusion 1991   autologous pts own blood given   . CAD (coronary artery disease)   . Cataract   . Chest pain    "@ rest, lying down, w/exertion"  . Chronic back pain    "mostly lower back but I do have upper back pain regularly" (04/06/2018)  . COPD (chronic obstructive pulmonary disease) (HCC)   . Depression   . Esophageal stricture   . Fibromyalgia   . GERD (gastroesophageal reflux disease)   . Heart murmur    "slight" (04/06/2018)  . Hiatal hernia   . Hyperlipidemia    Patient denies  . Hypertension   . Internal hemorrhoids   . Lumbago   . Mitral regurgitation   . Osteoarthritis   . Osteoporosis   . Pneumonia ~ 01/2018  . PONV (postoperative nausea and vomiting)    severe ponv, "in the past" (04/06/2018)  . Rectal bleeding   . Rheumatoid arthritis (HCC)   . Spondylosis   . TIA (transient ischemic attack) 2013  . Urinary incontinence    wears depends      Past Surgical History:  Procedure Laterality Date  . ABDOMINAL HYSTERECTOMY  1982  . BACK SURGERY    . BALLOON DILATION N/A 09/21/2018   Procedure: BALLOON DILATION;  Surgeon: Hilarie Fredrickson, MD;  Location: [facility] ENDOSCOPY;  Service: Endoscopy;  Laterality: N/A;  . BALLOON DILATION N/A 08/12/2019   Procedure: BALLOON DILATION;  Surgeon: Lynann Bologna, MD;  Location: [facility] ENDOSCOPY;  Service: Endoscopy;  Laterality: N/A;  . BIOPSY  08/12/2019   Procedure: BIOPSY;  Surgeon: Lynann Bologna, MD;  Location[facility] ENDOSCOPY;   Service: Endoscopy;;  . BLADDER SUSPENSION  1980's  . BOTOX INJECTION N/A 09/21/2018   Procedure: BOTOX INJECTION;  Surgeon: Hilarie Fredrickson, MD;  Location: [facility] ENDOSCOPY;  Service: Endoscopy;  Laterality: N/A;  . BOTOX INJECTION N/A 08/12/2019   Procedure: BOTOX INJECTION;  Surgeon: Lynann Bologna, MD;  Location: [facility] ENDOSCOPY;  Service: Endoscopy;  Laterality: N/A;  . CATARACT EXTRACTION W/ INTRAOCULAR LENS  IMPLANT, BILATERAL Bilateral 2010  . DILATION AND CURETTAGE OF UTERUS  1961  . ESOPHAGEAL MANOMETRY N/A 03/25/2018   Procedure: ESOPHAGEAL MANOMETRY (EM);  Surgeon: Napoleon Form, MD;  Location: [facility] ENDOSCOPY;  Service: Endoscopy;  Laterality: N/A;  . ESOPHAGOGASTRODUODENOSCOPY (EGD) WITH PROPOFOL N/A 04/07/2018   Procedure: ESOPHAGOGASTRODUODENOSCOPY (EGD) WITH PROPOFOL;  Surgeon: [name],  MD;  Location: [facility] ENDOSCOPY;  Service: Gastroenterology;  Laterality: N/A;  . ESOPHAGOGASTRODUODENOSCOPY (EGD) WITH PROPOFOL N/A 09/21/2018   Procedure: ESOPHAGOGASTRODUODENOSCOPY (EGD) WITH PROPOFOL;  Surgeon: Hilarie FredricksonPerry, John N, MD;  Location: [facility] ENDOSCOPY;  Service: Endoscopy;  Laterality: N/A;  . ESOPHAGOGASTRODUODENOSCOPY (EGD) WITH PROPOFOL N/A 08/12/2019   Procedure: ESOPHAGOGASTRODUODENOSCOPY (EGD) WITH PROPOFOL;  Surgeon: Lynann BolognaGupta, Rajesh, MD;  Location[facility] END[facility];  Service: Endoscopy;  Laterality: N/A;  . HERNIA REPAIR    . HIATAL HERNIA REPAIR N/A 08/02/2016   Procedure: LAPAROSCOPIC REPAIR OF LARGE  HIATAL HERNIA;  Surgeon: Glenna FellowsBenjamin Hoxworth, MD;  Location: [facility] ORS;  Service: General;  Laterality: N/A;  . JOINT REPLACEMENT    . LAPAROSCOPIC NISSEN FUNDOPLICATION N/A 08/02/2016   Procedure: LAPAROSCOPIC NISSEN FUNDOPLICATION;  Surgeon: Glenna FellowsBenjamin Hoxworth, MD;  Location: [facility] ORS;  Service: General;  Laterality: N/A;  . LUMBAR LAMINECTOMY  1990; 1994; [id number];9604; 1997;2000   "I've got 2 stainless steel rods; 6 screws; 2 ray cages"took bone from right hip to put in back  . RIGHT/LEFT HEART CATH AND CORONARY  ANGIOGRAPHY N/A 07/26/2019   Procedure: RIGHT/LEFT HEART CATH AND CORONARY ANGIOGRAPHY;  Surgeon: Lyn RecordsSmith, Henry W, MD;  Location: [facility] INVASIVE CV LAB;  Service: Cardiovascular;  Laterality: N/A;  . TONSILLECTOMY AND ADENOIDECTOMY  1945  . TOTAL KNEE ARTHROPLASTY Right ~ 1996  . TUBAL LIGATION  ~ 1976    Social History:  Ambulatory   Independently but needs help    reports that she has never smoked. She has never used smokeless tobacco. She reports that she does not drink alcohol or use drugs.     Family History:   Family History  Problem Relation Age of Onset  . Kidney disease Mother   . Hypertension Mother   . Heart disease Father 367       die of MI at age 81  . Suicidality Son   . Heart disease Brother   . Heart attack Brother   . Colon cancer Neg Hx   . Esophageal cancer Neg Hx   . Pancreatic cancer Neg Hx   . Rectal cancer Neg Hx   . Stomach cancer Neg Hx     Allergies: Allergies  Allergen Reactions  . Morphine Other (See Comments)    Flushing, rash, itching  . Tape Other (See Comments)    Band aides, adhesive tape Redness and pulls skin off     Prior to Admission medications   Medication Sig Start Date End Date Taking? Authorizing Provider  acetaminophen (TYLENOL) 325 MG tablet Take 2 tablets (650 mg total) by mouth every 4 (four) hours as needed for headache or mild pain. 07/27/19   Drema DallasWoods, Curtis J, MD  albuterol (VENTOLIN HFA) 108 (90 Base) MCG/ACT inhaler Inhale 2 puffs into the lungs every 6 (six) hours as needed for wheezing or shortness of breath. 02/23/19   [name], Tammy S, NP  amLODipine (NORVASC) 2.5 MG tablet Take 2.5 mg by mouth daily.    [provider]  aspirin EC 81 MG EC tablet Take 1 tablet (81 mg total) by mouth daily. 07/28/19   Drema DallasWoods, Curtis J, MD  carvedilol (COREG) 3.125 MG tablet TAKE 1 TABLET BY MOUTH TWICE A DAY WITH A MEAL 07/30/19   Nafziger, Kandee Keenory, NP  clonazePAM (KLONOPIN) 0.5 MG tablet TAKE 1 TABLET BY MOUTH TWICE A DAY AS NEEDED  FOR ANXIETY 05/12/19   Nafziger, Kandee Keenory, NP  ferrous sulfate 325 (65 FE) MG tablet Take 1 tablet (325 mg total) by mouth daily with breakfast.  07/28/19   Drema Dallas, MD  gabapentin (NEURONTIN) 100 MG capsule TAKE 1 CAPSULE BY MOUTH THREE TIMES A DAY 07/13/19   Nafziger, [name], NP  guaiFENesin-dextromethorphan (ROBITUSSIN DM) 100-10 MG/5ML syrup Take 10 mLs by mouth every 6 (six) hours as needed for cough. 08/13/19   Burnadette Pop, MD  loperamide (IMODIUM) 2 MG capsule Take 1 capsule (2 mg total) by mouth as needed for diarrhea or loose stools. 08/13/19   Burnadette Pop, MD  meloxicam (MOBIC) 15 MG tablet Take 1 tablet (15 mg total) by mouth daily. 11/05/18   Nafziger, [name], NP  Multiple Vitamin (MULTIVITAMIN) tablet Take 1 tablet by mouth daily. Vitamin supplement. 02/07/12   Viviann Spare, FNP  pantoprazole (PROTONIX) 40 MG tablet Take 1 tablet (40 mg total) by mouth 2 (two) times daily. 08/31/18   Tressia Danas, MD  sacubitril-valsartan (ENTRESTO) 24-26 MG Take 1 tablet by mouth 2 (two) times daily. 07/27/19   Drema Dallas, MD  sertraline (ZOLOFT) 100 MG tablet TAKE 1 TABLET BY MOUTH EVERYDAY AT BEDTIME 07/16/19   Nafziger, [name], NP  spironolactone (ALDACTONE) 25 MG tablet Take 0.5 tablets (12.5 mg total) by mouth daily. 07/28/19   Drema Dallas, MD  sucralfate (CARAFATE) 1 g tablet Take 1 tablet (1 g total) by mouth 4 (four) times daily. Dissolve tablet in 10 ml warm water Patient taking differently: Take 1 g by mouth 4 (four) times daily as needed (heartburn). Dissolve tablet in 10 ml warm water 08/31/18   Tressia Danas, MD  tetrahydrozoline 0.05 % ophthalmic solution Place 1 drop into both eyes daily.    [provider]   Physical Exam: Blood pressure 116/74, pulse (!) 105, temperature 98.7 F (37.1 C), temperature source Oral, resp. rate 20, height  (1.6 m), weight 29.8 kg, SpO2 100 %. 1. General:  in No   Acute distress   Chronically ill -appearing 2. Psychological:  Alert and  Oriented 3. Head/ENT:     Dry Mucous Membranes                          Head Non traumatic, neck supple                            Poor Dentition 4. SKIN:  decreased Skin turgor,  Skin clean Dry and intact no rash 5. Heart: Regular rate and rhythm systolic Murmur, no Rub or gallop 6. Lungs:   no wheezes some crackles   7. Abdomen: Soft,  non-tender, Non distended bowel sounds present 8. Lower extremities: no clubbing, cyanosis, trace edema 9. Neurologically Grossly intact, moving all 4 extremities equally   10. MSK: Normal range of motion   All other LABS:     Recent Labs  Lab 08/10/19 0401 08/11/19 0049 08/12/19 0912 08/16/19 2035  WBC 9.3 9.4 9.8 9.7  NEUTROABS 7.2 7.3 8.0* 7.0  HGB 8.9* 9.3* 9.7* 9.0*  HCT 28.5* 29.8* 30.5* 28.1*  MCV 89.6 89.0 88.7 90.6  PLT 258 325 348 305     Recent Labs  Lab 08/10/19 0401 08/10/19 1850 08/12/19 0912 08/13/19 0443 08/16/19 2035  NA 142  --  142 142 139  K 3.8  --  2.8* 3.8 3.8  CL 108  --  105 107 103  CO2 26  --  GLUCOSE 94  --  102* 93 103*  BUN 5*  --  <5* <  5* 11  CREATININE 0.67  --  0.64 0.57 0.85  CALCIUM 8.4*  --  8.5* 8.4* 8.6*  MG 1.7 1.7 1.6* 2.0 1.6*     Recent Labs  Lab 08/10/19 0401  AST 14*  ALT 13  ALKPHOS 60  BILITOT 0.5  PROT 5.2*  ALBUMIN 2.4*      Cultures:    Component Value Date/Time   SDES SPUTUM 08/07/2019 1806   SPECREQUEST NONE 08/07/2019 1806   CULT  08/07/2019 1600    Multiple bacterial morphotypes present, none predominant. Suggest appropriate recollection if clinically indicated.   REPTSTATUS 08/08/2019 FINAL 08/07/2019 1806     Radiological Exams on Admission: Dg Chest Portable 1 View  Result Date: 08/16/2019 CLINICAL DATA:  83 year old female with chest pain. EXAM: PORTABLE CHEST 1 VIEW COMPARISON:  Chest radiograph and CT dated 08/07/2019 dated 07/22/2019 FINDINGS: Small bilateral pleural effusions with bibasilar atelectasis versus infiltrate.  Significant interval improvement of left pulmonary airspace opacity seen on the radiograph of 08/07/2019. Mild cardiomegaly. Atherosclerotic calcification of the aortic arch. No acute osseous pathology. IMPRESSION: 1. Small bilateral pleural effusions with bibasilar atelectasis versus infiltrate. 2. Significant interval improvement of the left pulmonary airspace opacity seen on the radiograph of 08/07/2019. Electronically Signed   By: Elgie Collard M.D.   On: 08/16/2019 21:06    Chart has been reviewed    Assessment/Plan   83 y.o. female with medical history significant of hypertension, hyperlipidemia, systolic CHF, fibromyalgia, anemia, esophageal stricture, large symptomatic hiatal hernia status post fundoplication, chronic dysphagia, asthma/COPD on oxygen at 2 L at home, CAD, TIA, bipolar disorder fibromyalgia, rheumatoid arthritis, TIA, chronic urinary incontinence     Admitted for a.fib w RVR   Present on Admission: . Atrial fibrillation with RVR (HCC) -  - Admit to step down on Cardizem drip       CHA2D-VASC score 5       Will start on  Heparin,                   Check TSH      Cycle cardiac enzymes      Obtain ECHO      Cardiology consult in AM  . Hypothyroidism - - Check TSH    . Dyslipidemia - chronic stable  . UNSPECIFIED ANEMIA - obtain anemia panel  . Essential hypertension - somewhat soft bp while in a.fib w RVR Will hold home meds for tonight   . GERD - chronic stable  . Chest pain -had recent cardiac catheterization is reassuring could be related to GI issues such as esophageal stricture patient have undergone recent dilatation.  If persists may consider GI consult.  Restart Carafate and see if that would help  . Prolonged QT interval - - will monitor on tele avoid QT prolonging medications, rehydrate correct electrolytes   Hx of CHF- systolic CHF hold Entresto for tonight given soft BP pressures. Patient appears to be on a dry side will gently rehydrate.  Follow fluid status  . Chronic respiratory failure with hypoxia (HCC)-  Currently at baseline continue to monitor continue home oxygen Given increase dyspnea and tachycardia will check d.dimer  . COPD (chronic obstructive pulmonary disease) (HCC)/asthma patient states she never smoked.  Continue home medications currently stable   Other plan as per orders.  DVT prophylaxis:  heparin     Code Status:  FULL CODE   as per patient   I had personally discussed CODE STATUS with patient   Family Communication:  Family not at  Bedside   Disposition Plan:      To home once workup is complete and patient is stable                      Would benefit from PT/OT eval prior to DC  Ordered                                        Consults called:    emailed cardiology  Admission status:  ED Disposition    None      Obs     Level of care       SDU tele indefinitely please discontinue once patient no longer qualifies  Precautions:   No active isolations  PPE: Used by the provider:   P100  eye Goggles,  Gloves    Sheketa Ende 08/17/2019, 12:17 AM    [facility]     after 2 AM please page floor coverage PA If 7AM-7PM, please contact the day team taking care of the patient using [email or web address]

## 2019-08-16 NOTE — ED Triage Notes (Signed)
BIB [facility] from home with recurrent central chest pain radiating to her back with some SOB. Pt with dx of CHF and Afib x 1 month ago. Received 2 nitro and 324 asprin PTA with relief. Pt on 2L [place] baseline.

## 2019-08-16 NOTE — ED Provider Notes (Addendum)
[facility] EMERGENCY DEPARTMENT Provider Note   CSN: [id number] Arrival date & time: 08/16/19  2025     History   Chief Complaint Chief Complaint  Patient presents with  . Chest Pain    HPI Julie Rice is a 83 y.o. female.     HPI   83 year old female with chest pain.  She reports intermittent symptoms for the past several weeks.  She reports that home health nurse pointed out today on a visit that her heart rate was very erratic.  Patient denies any palpitations.  Some mild shortness of breath.  No fevers or chills.  No cough.  EMS gave her 2 nitroglycerin and 324 mg of aspirin.  Some improvement of her discomfort with this.  Past Medical History:  Diagnosis Date  . Anemia   . Anxiety   . Bipolar disorder ([place])   . Blood transfusion 1991   autologous pts own blood given   . CAD (coronary artery disease)   . Cataract   . Chest pain    "@ rest, lying down, w/exertion"  . Chronic back pain    "mostly lower back but I do have upper back pain regularly" (04/06/2018)  . COPD (chronic obstructive pulmonary disease) ([place])   . Depression   . Esophageal stricture   . Fibromyalgia   . GERD (gastroesophageal reflux disease)   . Heart murmur    "slight" (04/06/2018)  . Hiatal hernia   . Hyperlipidemia    Patient denies  . Hypertension   . Internal hemorrhoids   . Lumbago   . Mitral regurgitation   . Osteoarthritis   . Osteoporosis   . Pneumonia ~ 01/2018  . PONV (postoperative nausea and vomiting)    severe ponv, "in the past" (04/06/2018)  . Rectal bleeding   . Rheumatoid arthritis ([place])   . Spondylosis   . TIA (transient ischemic attack) 2013  . Urinary incontinence    wears depends    Patient Active Problem List   Diagnosis Date Noted  . Community acquired pneumonia of left lung 08/07/2019  . Prolonged QT interval 08/07/2019  . Sepsis ([place]) 08/07/2019  . Acute on chronic respiratory failure with hypoxia ([place]) 08/07/2019  . CHF (congestive  heart failure) ([place]) 07/24/2019  . Chest pain   . SOB (shortness of breath)   . Asthma 04/22/2019  . Abnormal CT of the chest 02/23/2019  . Esophageal motility disorder   . Esophageal dysmotility   . Loss of weight   . Moderate protein-calorie malnutrition ([place])   . Hematemesis 04/06/2018  . Periesophageal hiatal hernia 08/02/2016  . Dysphagia 05/10/2016  . Esophageal stricture 05/10/2016  . Epigastric pain 05/10/2016  . Generalized anxiety disorder 02/06/2012  . Major depressive disorder, recurrent ([place]) 02/05/2012  . Hypokalemia 11/09/2011  . Nausea and vomiting in adult 11/08/2011  . Dehydration 11/08/2011  . Tremors of nervous system 11/08/2011  . Hypothyroidism 05/10/2008  . Dyslipidemia 05/10/2008  . UNSPECIFIED ANEMIA 05/10/2008  . CAROTID ARTERY DISEASE 05/10/2008  . Transient cerebral ischemia 05/10/2008  . Chronic back pain 05/10/2008  . OSTEOPENIA 05/10/2008  . Dyspnea 05/10/2008  . ESOPHAGEAL STRICTURE 01/19/2008  . Essential hypertension 04/29/2007  . GERD 04/29/2007  . Osteoarthritis 04/29/2007  . SPONDYLOSIS 04/29/2007    Past Surgical History:  Procedure Laterality Date  . ABDOMINAL HYSTERECTOMY  1982  . BACK SURGERY    . BALLOON DILATION N/A 09/21/2018   Procedure: BALLOON DILATION;  Surgeon: Irene Shipper, MD;  Location: [facility]  ENDOSCOPY;  Service: Endoscopy;  Laterality: N/A;  . BALLOON DILATION N/A 08/12/2019   Procedure: BALLOON DILATION;  Surgeon: Lynann Bologna, MD;  Location: [facility] ENDOSCOPY;  Service: Endoscopy;  Laterality: N/A;  . BIOPSY  08/12/2019   Procedure: BIOPSY;  Surgeon: Lynann Bologna, MD;  Location: [facility] ENDOSCOPY;  Service: Endoscopy;;  . BLADDER SUSPENSION  1980's  . BOTOX INJECTION N/A 09/21/2018   Procedure: BOTOX INJECTION;  Surgeon: Hilarie Fredrickson, MD;  Location: [facility] ENDOSCOPY;  Service: Endoscopy;  Laterality: N/A;  . BOTOX INJECTION N/A 08/12/2019   Procedure: BOTOX INJECTION;  Surgeon: Lynann Bologna, MD;  Location: [facility] ENDOSCOPY;   Service: Endoscopy;  Laterality: N/A;  . CATARACT EXTRACTION W/ INTRAOCULAR LENS  IMPLANT, BILATERAL Bilateral 2010  . DILATION AND CURETTAGE OF UTERUS  1961  . ESOPHAGEAL MANOMETRY N/A 03/25/2018   Procedure: ESOPHAGEAL MANOMETRY (EM);  Surgeon: Napoleon Form, MD;  Location: [facility] ENDOSCOPY;  Service: Endoscopy;  Laterality: N/A;  . ESOPHAGOGASTRODUODENOSCOPY (EGD) WITH PROPOFOL N/A 04/07/2018   Procedure: ESOPHAGOGASTRODUODENOSCOPY (EGD) WITH PROPOFOL;  Surgeon: Sherrilyn Rist, MD;  Location: [facility] ENDOSCOPY;  Service: Gastroenterology;  Laterality: N/A;  . ESOPHAGOGASTRODUODENOSCOPY (EGD) WITH PROPOFOL N/A 09/21/2018   Procedure: ESOPHAGOGASTRODUODENOSCOPY (EGD) WITH PROPOFOL;  Surgeon: Hilarie Fredrickson, MD;  Location: [facility] ENDOSCOPY;  Service: Endoscopy;  Laterality: N/A;  . ESOPHAGOGASTRODUODENOSCOPY (EGD) WITH PROPOFOL N/A 08/12/2019   Procedure: ESOPHAGOGASTRODUODENOSCOPY (EGD) WITH PROPOFOL;  Surgeon: Lynann Bologna, MD;  Location: [facility] ENDOSCOPY;  Service: Endoscopy;  Laterality: N/A;  . HERNIA REPAIR    . HIATAL HERNIA REPAIR N/A 08/02/2016   Procedure: LAPAROSCOPIC REPAIR OF LARGE  HIATAL HERNIA;  Surgeon: Glenna Fellows, MD;  Location: [facility] ORS;  Service: General;  Laterality: N/A;  . JOINT REPLACEMENT    . LAPAROSCOPIC NISSEN FUNDOPLICATION N/A 08/02/2016   Procedure: LAPAROSCOPIC NISSEN FUNDOPLICATION;  Surgeon: Glenna Fellows, MD;  Location: [facility] ORS;  Service: General;  Laterality: N/A;  . LUMBAR LAMINECTOMY  1990; 1994; [id number];[id number]   "I've got 2 stainless steel rods; 6 screws; 2 ray cages"took bone from right hip to put in back  . RIGHT/LEFT HEART CATH AND CORONARY ANGIOGRAPHY N/A 07/26/2019   Procedure: RIGHT/LEFT HEART CATH AND CORONARY ANGIOGRAPHY;  Surgeon: Lyn Records, MD;  Location: [facility] INVASIVE CV LAB;  Service: Cardiovascular;  Laterality: N/A;  . TONSILLECTOMY AND ADENOIDECTOMY  1945  . TOTAL KNEE ARTHROPLASTY Right ~ 1996  . TUBAL LIGATION  ~ 1976     OB History   No  obstetric history on file.      Home Medications    Prior to Admission medications   Medication Sig Start Date End Date Taking? Authorizing Provider  acetaminophen (TYLENOL) 325 MG tablet Take 2 tablets (650 mg total) by mouth every 4 (four) hours as needed for headache or mild pain. 07/27/19   Drema Dallas, MD  albuterol (VENTOLIN HFA) 108 (90 Base) MCG/ACT inhaler Inhale 2 puffs into the lungs every 6 (six) hours as needed for wheezing or shortness of breath. 02/23/19   [name], Tammy S, NP  amLODipine (NORVASC) 2.5 MG tablet Take 2.5 mg by mouth daily.    [provider]  aspirin EC 81 MG EC tablet Take 1 tablet (81 mg total) by mouth daily. 07/28/19   Drema Dallas, MD  carvedilol (COREG) 3.125 MG tablet TAKE 1 TABLET BY MOUTH TWICE A DAY WITH A MEAL 07/30/19   Nafziger, [name], NP  clonazePAM (KLONOPIN) 0.5 MG tablet TAKE 1 TABLET BY MOUTH TWICE A DAY AS  NEEDED FOR ANXIETY 05/12/19   Nafziger, Kandee Keenory, NP  ferrous sulfate 325 (65 FE) MG tablet Take 1 tablet (325 mg total) by mouth daily with breakfast. 07/28/19   Drema DallasWoods, Curtis J, MD  gabapentin (NEURONTIN) 100 MG capsule TAKE 1 CAPSULE BY MOUTH THREE TIMES A DAY 07/13/19   Nafziger, Kandee Keenory, NP  guaiFENesin-dextromethorphan (ROBITUSSIN DM) 100-10 MG/5ML syrup Take 10 mLs by mouth every 6 (six) hours as needed for cough. 08/13/19   Burnadette PopAdhikari, Amrit, MD  loperamide (IMODIUM) 2 MG capsule Take 1 capsule (2 mg total) by mouth as needed for diarrhea or loose stools. 08/13/19   Burnadette PopAdhikari, Amrit, MD  meloxicam (MOBIC) 15 MG tablet Take 1 tablet (15 mg total) by mouth daily. 11/05/18   Nafziger, Kandee Keenory, NP  Multiple Vitamin (MULTIVITAMIN) tablet Take 1 tablet by mouth daily. Vitamin supplement. 02/07/12   Viviann SpareScott, Margaret A, FNP  pantoprazole (PROTONIX) 40 MG tablet Take 1 tablet (40 mg total) by mouth 2 (two) times daily. 08/31/18   Tressia DanasBeavers, Kimberly, MD  sacubitril-valsartan (ENTRESTO) 24-26 MG Take 1 tablet by mouth 2 (two) times daily. 07/27/19   Drema DallasWoods,  Curtis J, MD  sertraline (ZOLOFT) 100 MG tablet TAKE 1 TABLET BY MOUTH EVERYDAY AT BEDTIME 07/16/19   Nafziger, Kandee Keenory, NP  spironolactone (ALDACTONE) 25 MG tablet Take 0.5 tablets (12.5 mg total) by mouth daily. 07/28/19   Drema DallasWoods, Curtis J, MD  sucralfate (CARAFATE) 1 g tablet Take 1 tablet (1 g total) by mouth 4 (four) times daily. Dissolve tablet in 10 ml warm water Patient taking differently: Take 1 g by mouth 4 (four) times daily as needed (heartburn). Dissolve tablet in 10 ml warm water 08/31/18   Tressia DanasBeavers, Kimberly, MD  tetrahydrozoline 0.05 % ophthalmic solution Place 1 drop into both eyes daily.    [provider]    Family History Family History  Problem Relation Age of Onset  . Kidney disease Mother   . Hypertension Mother   . Heart disease Father [id number]       die of MI at age 81  . Suicidality Son   . Heart disease Brother   . Heart attack Brother   . Colon cancer Neg Hx   . Esophageal cancer Neg Hx   . Pancreatic cancer Neg Hx   . Rectal cancer Neg Hx   . Stomach cancer Neg Hx     Social History Social History   Tobacco Use  . Smoking status: Never Smoker  . Smokeless tobacco: Never Used  Substance Use Topics  . Alcohol use: Never    Frequency: Never  . Drug use: Never     Allergies   Morphine and Tape   Review of Systems Review of Systems  All systems reviewed and negative, other than as noted in HPI.  Physical Exam Updated Vital Signs BP 120/70   Pulse (!) 149   Temp 98.7 F (37.1 C) (Oral)   Resp (!) 23   Ht 5\' 3"  (1.6 m)   Wt 29.8 kg   SpO2 100%   BMI 11.64 kg/m   Physical Exam Vitals signs and nursing note reviewed.  Constitutional:      General: She is not in acute distress.    Appearance: She is well-developed.  HENT:     Head: Normocephalic and atraumatic.  Eyes:     General:        Right eye: No discharge.        Left eye: No discharge.     Conjunctiva/sclera: Conjunctivae normal.  Neck:     Musculoskeletal: Neck supple.   Cardiovascular:     Rate and Rhythm: Tachycardia present. Rhythm irregular.     Heart sounds: Normal heart sounds. No murmur. No friction rub. No gallop.   Pulmonary:     Effort: Pulmonary effort is normal. No respiratory distress.     Breath sounds: Normal breath sounds.  Abdominal:     General: There is no distension.     Palpations: Abdomen is soft.     Tenderness: There is no abdominal tenderness.  Musculoskeletal:        General: No tenderness.  Skin:    General: Skin is warm and dry.  Neurological:     Mental Status: She is alert.  Psychiatric:        Behavior: Behavior normal.        Thought Content: Thought content normal.      ED Treatments / Results  Labs (all labs ordered are listed, but only abnormal results are displayed) Labs Reviewed  CBC WITH DIFFERENTIAL/PLATELET - Abnormal; Notable for the following components:      Result Value   RBC 3.10 (*)    Hemoglobin 9.0 (*)    HCT 28.1 (*)    RDW 16.0 (*)    All other components within normal limits  BASIC METABOLIC PANEL - Abnormal; Notable for the following components:   Glucose, Bld 103 (*)    Calcium 8.6 (*)    All other components within normal limits  MAGNESIUM - Abnormal; Notable for the following components:   Magnesium 1.6 (*)    All other components within normal limits  URINALYSIS, ROUTINE W REFLEX MICROSCOPIC - Abnormal; Notable for the following components:   APPearance HAZY (*)    All other components within normal limits  RETICULOCYTES - Abnormal; Notable for the following components:   RBC. 3.23 (*)    Immature Retic Fract 22.6 (*)    All other components within normal limits  D-DIMER, QUANTITATIVE (NOT AT [facility]) - Abnormal; Notable for the following components:   D-Dimer, Quant 1.85 (*)    All other components within normal limits  HEPARIN LEVEL (UNFRACTIONATED) - Abnormal; Notable for the following components:   Heparin Unfractionated <0.10 (*)    All other components within normal  limits  COMPREHENSIVE METABOLIC PANEL - Abnormal; Notable for the following components:   Calcium 8.3 (*)    Total Protein 5.7 (*)    Albumin 2.9 (*)    All other components within normal limits  CBC - Abnormal; Notable for the following components:   RBC 3.23 (*)    Hemoglobin 9.3 (*)    HCT 30.0 (*)    RDW 15.8 (*)    All other components within normal limits  T4, FREE - Abnormal; Notable for the following components:   Free T4 1.37 (*)    All other components within normal limits  BRAIN NATRIURETIC PEPTIDE - Abnormal; Notable for the following components:   B Natriuretic Peptide 599.2 (*)    All other components within normal limits  HEPARIN LEVEL (UNFRACTIONATED) - Abnormal; Notable for the following components:   Heparin Unfractionated 0.27 (*)    All other components within normal limits  CBC - Abnormal; Notable for the following components:   RBC 3.10 (*)    Hemoglobin 8.6 (*)    HCT 28.6 (*)    RDW 15.9 (*)    All other components within normal limits  BASIC METABOLIC PANEL - Abnormal; Notable for the following components:  BUN 6 (*)    Calcium 8.0 (*)    All other components within normal limits  HEPARIN LEVEL (UNFRACTIONATED) - Abnormal; Notable for the following components:   Heparin Unfractionated 0.16 (*)    All other components within normal limits  CBC - Abnormal; Notable for the following components:   RBC 3.12 (*)    Hemoglobin 8.8 (*)    HCT 28.6 (*)    RDW 16.2 (*)    All other components within normal limits  BASIC METABOLIC PANEL - Abnormal; Notable for the following components:   Glucose, Bld 101 (*)    BUN 7 (*)    Calcium 8.4 (*)    GFR calc non Af Amer 57 (*)    All other components within normal limits  BASIC METABOLIC PANEL - Abnormal; Notable for the following components:   Glucose, Bld 115 (*)    Calcium 8.5 (*)    GFR calc non Af Amer 59 (*)    All other components within normal limits  BASIC METABOLIC PANEL - Abnormal; Notable for the  following components:   Calcium 8.8 (*)    All other components within normal limits  TROPONIN I (HIGH SENSITIVITY) - Abnormal; Notable for the following components:   Troponin I (High Sensitivity) 35 (*)    All other components within normal limits  TROPONIN I (HIGH SENSITIVITY) - Abnormal; Notable for the following components:   Troponin I (High Sensitivity) 32 (*)    All other components within normal limits  TROPONIN I (HIGH SENSITIVITY) - Abnormal; Notable for the following components:   Troponin I (High Sensitivity) 32 (*)    All other components within normal limits  TROPONIN I (HIGH SENSITIVITY) - Abnormal; Notable for the following components:   Troponin I (High Sensitivity) 30 (*)    All other components within normal limits  SARS CORONAVIRUS 2 (TAT 6-24 HRS)  VITAMIN B12  FOLATE  IRON AND TIBC  FERRITIN  MAGNESIUM  PHOSPHORUS  PHOSPHORUS  TSH  HEPARIN LEVEL (UNFRACTIONATED)  MAGNESIUM  HEPARIN LEVEL (UNFRACTIONATED)  HEPARIN LEVEL (UNFRACTIONATED)  LIPID PANEL  MAGNESIUM  MAGNESIUM    EKG EKG Interpretation  Date/Time:  Monday August 16 2019 20:31:44 EDT Ventricular Rate:  147 PR Interval:    QRS Duration: 123 QT Interval:  349 QTC Calculation: 546 R Axis:   -115 Text Interpretation:  Atrial fibrillation with rapid ventricular response Left bundle branch block Confirmed by Raeford Razor [phone]) on 08/16/2019 8:49:29 PM   Radiology Dg Chest Portable 1 View  Result Date: 08/16/2019 CLINICAL DATA:  83 year old female with chest pain. EXAM: PORTABLE CHEST 1 VIEW COMPARISON:  Chest radiograph and CT dated 08/07/2019 dated 07/22/2019 FINDINGS: Small bilateral pleural effusions with bibasilar atelectasis versus infiltrate. Significant interval improvement of left pulmonary airspace opacity seen on the radiograph of 08/07/2019. Mild cardiomegaly. Atherosclerotic calcification of the aortic arch. No acute osseous pathology. IMPRESSION: 1. Small bilateral pleural  effusions with bibasilar atelectasis versus infiltrate. 2. Significant interval improvement of the left pulmonary airspace opacity seen on the radiograph of 08/07/2019. Electronically Signed   By: Elgie Collard M.D.   On: 08/16/2019 21:06    Procedures Procedures (including critical care time)  CRITICAL CARE Performed by: Raeford Razor Total critical care time: 35 minutes Critical care time was exclusive of separately billable procedures and treating other patients. Critical care was necessary to treat or prevent imminent or life-threatening deterioration. Critical care was time spent personally by me on the following activities: development of treatment  plan with patient and/or surrogate as well as nursing, discussions with consultants, evaluation of patient's response to treatment, examination of patient, obtaining history from patient or surrogate, ordering and performing treatments and interventions, ordering and review of laboratory studies, ordering and review of radiographic studies, pulse oximetry and re-evaluation of patient's condition.   Medications Ordered in ED Medications  diltiazem (CARDIZEM) 1 mg/mL load via infusion 10 mg (10 mg Intravenous Bolus from Bag 08/16/19 2144)    And  diltiazem (CARDIZEM) 125 mg in dextrose 5% 125 mL (1 mg/mL) infusion (7.5 mg/hr Intravenous Rate/Dose Change 08/16/19 2213)     Initial Impression / Assessment and Plan / ED Course  I have reviewed the triage vital signs and the nursing notes.  Pertinent labs & imaging results that were available during my care of the patient were reviewed by me and considered in my medical decision making (see chart for details).        83 year old female with chest pain.  Noted to be in A. fib with RVR.  Known left bundle branch block.  Afib appears to be a new diagnosis.  Unclear to me as to exact onset of symptoms.  Patient has been having intermittent chest pain for the past several weeks.  She was  actually recently admitted and had LHC on 07/26/19 that showed 30-40% stenosis ofmid LAD.   She tells me that at home health nurse noted erratic heart rate today but she denies denies palpitations. She is not anticoagulated. Not clear candidate for ED cardioversion. I suspect onset probably today although she says pain is similar to what she has been experiencing for weeks.  She was started on cardizem for rate control. Troponin is mildly elevated and likely rated related. Still having CP although improved. Suspect not cardiac. Just had LHC with nonobstructive CAD. Also recent diagnosis of pneumonia. CXR today looks improved. Fibromyalgia. CHADSVASC 5 (age, female, htn, chf).   Final Clinical Impressions(s) / ED Diagnoses   Final diagnoses:  Atrial fibrillation with RVR [facility])    ED Discharge Orders    None       Raeford Razor, MD 08/21/19 1109    Raeford Razor, MD 09/10/19 [phone]

## 2019-08-16 NOTE — Progress Notes (Signed)
ANTICOAGULATION CONSULT NOTE - Initial Consult  Pharmacy Consult for Heparin Indication: chest pain/Afib  Allergies  Allergen Reactions  . Morphine Other (See Comments)    Flushing, rash, itching  . Tape Other (See Comments)    Band aides, adhesive tape Redness and pulls skin off    Patient Measurements: Height: 5\' 3"  (160 cm) Weight: 135 lb (61.2 kg) IBW/kg (Calculated) : 52.4  Vital Signs: Temp: 98.7 F (37.1 C) (10/12 2034) Temp Source: Oral (10/12 2034) BP: 116/74 (10/12 2200) Pulse Rate: 105 (10/12 2200)  Labs: Recent Labs    08/16/19 2035 08/16/19 2212  HGB 9.0*  --   HCT 28.1*  --   PLT 305  --   CREATININE 0.85  --   TROPONINIHS 35* 32*    Estimated Creatinine Clearance: 42.9 mL/min (by C-G formula based on SCr of 0.85 mg/dL).   Medical History: Past Medical History:  Diagnosis Date  . Anemia   . Anxiety   . Bipolar disorder ([facility])   . Blood transfusion 1991   autologous pts own blood given   . CAD (coronary artery disease)   . Cataract   . Chest pain    "@ rest, lying down, w/exertion"  . Chronic back pain    "mostly lower back but I do have upper back pain regularly" (04/06/2018)  . COPD (chronic obstructive pulmonary disease) ([facility])   . Depression   . Esophageal stricture   . Fibromyalgia   . GERD (gastroesophageal reflux disease)   . Heart murmur    "slight" (04/06/2018)  . Hiatal hernia   . Hyperlipidemia    Patient denies  . Hypertension   . Internal hemorrhoids   . Lumbago   . Mitral regurgitation   . Osteoarthritis   . Osteoporosis   . Pneumonia ~ 01/2018  . PONV (postoperative nausea and vomiting)    severe ponv, "in the past" (04/06/2018)  . Rectal bleeding   . Rheumatoid arthritis ([facility])   . Spondylosis   . TIA (transient ischemic attack) 2013  . Urinary incontinence    wears depends    Medications:  No current facility-administered medications on file prior to encounter.    Current Outpatient Medications on File Prior to  Encounter  Medication Sig Dispense Refill  . acetaminophen (TYLENOL) 325 MG tablet Take 2 tablets (650 mg total) by mouth every 4 (four) hours as needed for headache or mild pain. 30 tablet 0  . albuterol (VENTOLIN HFA) 108 (90 Base) MCG/ACT inhaler Inhale 2 puffs into the lungs every 6 (six) hours as needed for wheezing or shortness of breath. 1 Inhaler 6  . amLODipine (NORVASC) 2.5 MG tablet Take 2.5 mg by mouth daily.    2014 aspirin EC 81 MG EC tablet Take 1 tablet (81 mg total) by mouth daily. 30 tablet 0  . carvedilol (COREG) 3.125 MG tablet TAKE 1 TABLET BY MOUTH TWICE A DAY WITH A MEAL 60 tablet 0  . clonazePAM (KLONOPIN) 0.5 MG tablet TAKE 1 TABLET BY MOUTH TWICE A DAY AS NEEDED FOR ANXIETY 60 tablet 2  . ferrous sulfate 325 (65 FE) MG tablet Take 1 tablet (325 mg total) by mouth daily with breakfast. 30 tablet 0  . gabapentin (NEURONTIN) 100 MG capsule TAKE 1 CAPSULE BY MOUTH THREE TIMES A DAY 270 capsule 1  . guaiFENesin-dextromethorphan (ROBITUSSIN DM) 100-10 MG/5ML syrup Take 10 mLs by mouth every 6 (six) hours as needed for cough. 118 mL 1  . loperamide (IMODIUM) 2 MG capsule Take  1 capsule (2 mg total) by mouth as needed for diarrhea or loose stools. 30 capsule 0  . meloxicam (MOBIC) 15 MG tablet Take 1 tablet (15 mg total) by mouth daily. 90 tablet 3  . Multiple Vitamin (MULTIVITAMIN) tablet Take 1 tablet by mouth daily. Vitamin supplement.    . pantoprazole (PROTONIX) 40 MG tablet Take 1 tablet (40 mg total) by mouth 2 (two) times daily. 60 tablet 3  . sacubitril-valsartan (ENTRESTO) 24-26 MG Take 1 tablet by mouth 2 (two) times daily. 60 tablet 0  . sertraline (ZOLOFT) 100 MG tablet TAKE 1 TABLET BY MOUTH EVERYDAY AT BEDTIME 90 tablet 0  . spironolactone (ALDACTONE) 25 MG tablet Take 0.5 tablets (12.5 mg total) by mouth daily. 30 tablet 0  . sucralfate (CARAFATE) 1 g tablet Take 1 tablet (1 g total) by mouth 4 (four) times daily. Dissolve tablet in 10 ml warm water (Patient taking  differently: Take 1 g by mouth 4 (four) times daily as needed (heartburn). Dissolve tablet in 10 ml warm water) 56 tablet 1  . tetrahydrozoline 0.05 % ophthalmic solution Place 1 drop into both eyes daily.      Assessment: 83 y.o. female with chest pain and Afib for heparin Goal of Therapy:  Heparin level 0.3-0.7 units/ml Monitor platelets by anticoagulation protocol: Yes   Plan:  Heparin 3000 units IV bolus, then start heparin 900 units/hr Check heparin level in 8 hours.    Alezander Dimaano, Bronson Curb 08/16/2019,11:53 PM

## 2019-08-16 NOTE — Telephone Encounter (Signed)
ATC patient - no answer and voicemail box is full.  Patient is scheduled to see MW on 11.3.2020. Patient is active on MyChart, will send message.

## 2019-08-17 ENCOUNTER — Encounter (HOSPITAL_COMMUNITY): Payer: Self-pay

## 2019-08-17 ENCOUNTER — Ambulatory Visit (HOSPITAL_COMMUNITY): Admit: 2019-08-17 | Payer: Medicare Other | Admitting: Internal Medicine

## 2019-08-17 ENCOUNTER — Encounter: Payer: Self-pay | Admitting: Gastroenterology

## 2019-08-17 ENCOUNTER — Observation Stay (HOSPITAL_BASED_OUTPATIENT_CLINIC_OR_DEPARTMENT_OTHER): Payer: Medicare Other

## 2019-08-17 ENCOUNTER — Observation Stay (HOSPITAL_COMMUNITY): Payer: Medicare Other

## 2019-08-17 DIAGNOSIS — E039 Hypothyroidism, unspecified: Secondary | ICD-10-CM | POA: Diagnosis present

## 2019-08-17 DIAGNOSIS — I428 Other cardiomyopathies: Secondary | ICD-10-CM | POA: Diagnosis present

## 2019-08-17 DIAGNOSIS — I4891 Unspecified atrial fibrillation: Principal | ICD-10-CM

## 2019-08-17 DIAGNOSIS — R9431 Abnormal electrocardiogram [ECG] [EKG]: Secondary | ICD-10-CM | POA: Diagnosis not present

## 2019-08-17 DIAGNOSIS — R778 Other specified abnormalities of plasma proteins: Secondary | ICD-10-CM | POA: Diagnosis not present

## 2019-08-17 DIAGNOSIS — J9611 Chronic respiratory failure with hypoxia: Secondary | ICD-10-CM | POA: Diagnosis present

## 2019-08-17 DIAGNOSIS — M797 Fibromyalgia: Secondary | ICD-10-CM | POA: Diagnosis present

## 2019-08-17 DIAGNOSIS — I2510 Atherosclerotic heart disease of native coronary artery without angina pectoris: Secondary | ICD-10-CM | POA: Diagnosis present

## 2019-08-17 DIAGNOSIS — J449 Chronic obstructive pulmonary disease, unspecified: Secondary | ICD-10-CM

## 2019-08-17 DIAGNOSIS — I248 Other forms of acute ischemic heart disease: Secondary | ICD-10-CM | POA: Diagnosis present

## 2019-08-17 DIAGNOSIS — I5023 Acute on chronic systolic (congestive) heart failure: Secondary | ICD-10-CM | POA: Diagnosis present

## 2019-08-17 DIAGNOSIS — I10 Essential (primary) hypertension: Secondary | ICD-10-CM

## 2019-08-17 DIAGNOSIS — F319 Bipolar disorder, unspecified: Secondary | ICD-10-CM | POA: Diagnosis present

## 2019-08-17 DIAGNOSIS — Z90710 Acquired absence of both cervix and uterus: Secondary | ICD-10-CM | POA: Diagnosis not present

## 2019-08-17 DIAGNOSIS — J9621 Acute and chronic respiratory failure with hypoxia: Secondary | ICD-10-CM | POA: Diagnosis not present

## 2019-08-17 DIAGNOSIS — M810 Age-related osteoporosis without current pathological fracture: Secondary | ICD-10-CM | POA: Diagnosis present

## 2019-08-17 DIAGNOSIS — K219 Gastro-esophageal reflux disease without esophagitis: Secondary | ICD-10-CM | POA: Diagnosis present

## 2019-08-17 DIAGNOSIS — I340 Nonrheumatic mitral (valve) insufficiency: Secondary | ICD-10-CM

## 2019-08-17 DIAGNOSIS — I110 Hypertensive heart disease with heart failure: Secondary | ICD-10-CM | POA: Diagnosis present

## 2019-08-17 DIAGNOSIS — I429 Cardiomyopathy, unspecified: Secondary | ICD-10-CM | POA: Diagnosis not present

## 2019-08-17 DIAGNOSIS — R079 Chest pain, unspecified: Secondary | ICD-10-CM | POA: Diagnosis not present

## 2019-08-17 DIAGNOSIS — R112 Nausea with vomiting, unspecified: Secondary | ICD-10-CM | POA: Diagnosis not present

## 2019-08-17 DIAGNOSIS — Z9981 Dependence on supplemental oxygen: Secondary | ICD-10-CM | POA: Diagnosis not present

## 2019-08-17 DIAGNOSIS — Z7982 Long term (current) use of aspirin: Secondary | ICD-10-CM | POA: Diagnosis not present

## 2019-08-17 DIAGNOSIS — K224 Dyskinesia of esophagus: Secondary | ICD-10-CM | POA: Diagnosis present

## 2019-08-17 DIAGNOSIS — I361 Nonrheumatic tricuspid (valve) insufficiency: Secondary | ICD-10-CM

## 2019-08-17 DIAGNOSIS — Z8673 Personal history of transient ischemic attack (TIA), and cerebral infarction without residual deficits: Secondary | ICD-10-CM | POA: Diagnosis not present

## 2019-08-17 DIAGNOSIS — Z96651 Presence of right artificial knee joint: Secondary | ICD-10-CM | POA: Diagnosis present

## 2019-08-17 DIAGNOSIS — M069 Rheumatoid arthritis, unspecified: Secondary | ICD-10-CM | POA: Diagnosis present

## 2019-08-17 DIAGNOSIS — Z79899 Other long term (current) drug therapy: Secondary | ICD-10-CM | POA: Diagnosis not present

## 2019-08-17 DIAGNOSIS — E785 Hyperlipidemia, unspecified: Secondary | ICD-10-CM | POA: Diagnosis present

## 2019-08-17 DIAGNOSIS — Z20828 Contact with and (suspected) exposure to other viral communicable diseases: Secondary | ICD-10-CM | POA: Diagnosis present

## 2019-08-17 DIAGNOSIS — D638 Anemia in other chronic diseases classified elsewhere: Secondary | ICD-10-CM | POA: Diagnosis present

## 2019-08-17 LAB — URINALYSIS, ROUTINE W REFLEX MICROSCOPIC
Bilirubin Urine: NEGATIVE
Glucose, UA: NEGATIVE mg/dL
Hgb urine dipstick: NEGATIVE
Ketones, ur: NEGATIVE mg/dL
Leukocytes,Ua: NEGATIVE
Nitrite: NEGATIVE
Protein, ur: NEGATIVE mg/dL
Specific Gravity, Urine: 1.01 (ref 1.005–1.030)
pH: 7 (ref 5.0–8.0)

## 2019-08-17 LAB — COMPREHENSIVE METABOLIC PANEL
ALT: 17 U/L (ref 0–44)
AST: 18 U/L (ref 15–41)
Albumin: 2.9 g/dL — ABNORMAL LOW (ref 3.5–5.0)
Alkaline Phosphatase: 62 U/L (ref 38–126)
Anion gap: 13 (ref 5–15)
BUN: 8 mg/dL (ref 8–23)
CO2: 25 mmol/L (ref 22–32)
Calcium: 8.3 mg/dL — ABNORMAL LOW (ref 8.9–10.3)
Chloride: 103 mmol/L (ref 98–111)
Creatinine, Ser: 0.68 mg/dL (ref 0.44–1.00)
GFR calc Af Amer: >60 mL/min (ref >60)
GFR calc non Af Amer: >60 mL/min (ref >60)
Glucose, Bld: 96 mg/dL (ref 70–99)
Potassium: 3.8 mmol/L (ref 3.5–5.1)
Sodium: 141 mmol/L (ref 135–145)
Total Bilirubin: 0.6 mg/dL (ref 0.3–1.2)
Total Protein: 5.7 g/dL — ABNORMAL LOW (ref 6.5–8.1)

## 2019-08-17 LAB — CBC
HCT: 30 % — ABNORMAL LOW (ref 36.0–46.0)
Hemoglobin: 9.3 g/dL — ABNORMAL LOW (ref 12.0–15.0)
MCH: 28.8 pg (ref 26.0–34.0)
MCHC: 31 g/dL (ref 30.0–36.0)
MCV: 92.9 fL (ref 80.0–100.0)
Platelets: 303 10*3/uL (ref 150–400)
RBC: 3.23 MIL/uL — ABNORMAL LOW (ref 3.87–5.11)
RDW: 15.8 % — ABNORMAL HIGH (ref 11.5–15.5)
WBC: 9.3 10*3/uL (ref 4.0–10.5)
nRBC: 0 % (ref 0.0–0.2)

## 2019-08-17 LAB — RETICULOCYTES
Immature Retic Fract: 22.6 % — ABNORMAL HIGH (ref 2.3–15.9)
RBC.: 3.23 MIL/uL — ABNORMAL LOW (ref 3.87–5.11)
Retic Count, Absolute: 95 10*3/uL (ref 19.0–186.0)
Retic Ct Pct: 2.9 % (ref 0.4–3.1)

## 2019-08-17 LAB — IRON AND TIBC
Iron: 30 ug/dL (ref 28–170)
Saturation Ratios: 12 % (ref 10.4–31.8)
TIBC: 259 ug/dL (ref 250–450)
UIBC: 229 ug/dL

## 2019-08-17 LAB — VITAMIN B12: Vitamin B-12: 502 pg/mL (ref 180–914)

## 2019-08-17 LAB — TSH: TSH: 1.654 u[IU]/mL (ref 0.350–4.500)

## 2019-08-17 LAB — PHOSPHORUS
Phosphorus: 3.8 mg/dL (ref 2.5–4.6)
Phosphorus: 3.6 mg/dL (ref 2.5–4.6)

## 2019-08-17 LAB — ECHOCARDIOGRAM COMPLETE
Height: 63 in
Weight: 2160 oz

## 2019-08-17 LAB — TROPONIN I (HIGH SENSITIVITY)
Troponin I (High Sensitivity): 32 ng/L — ABNORMAL HIGH (ref <18)
Troponin I (High Sensitivity): 30 ng/L — ABNORMAL HIGH (ref <18)

## 2019-08-17 LAB — HEPARIN LEVEL (UNFRACTIONATED)
Heparin Unfractionated: <0.1 IU/mL — ABNORMAL LOW (ref 0.30–0.70)
Heparin Unfractionated: 0.41 IU/mL (ref 0.30–0.70)

## 2019-08-17 LAB — BRAIN NATRIURETIC PEPTIDE: B Natriuretic Peptide: 599.2 pg/mL — ABNORMAL HIGH (ref 0.0–100.0)

## 2019-08-17 LAB — MAGNESIUM: Magnesium: 1.9 mg/dL (ref 1.7–2.4)

## 2019-08-17 LAB — SARS CORONAVIRUS 2 (TAT 6-24 HRS): SARS Coronavirus 2: NEGATIVE

## 2019-08-17 LAB — FOLATE: Folate: 22.2 ng/mL (ref >5.9)

## 2019-08-17 LAB — T4, FREE: Free T4: 1.37 ng/dL — ABNORMAL HIGH (ref 0.61–1.12)

## 2019-08-17 LAB — D-DIMER, QUANTITATIVE: D-Dimer, Quant: 1.85 ug/mL-FEU — ABNORMAL HIGH (ref 0.00–0.50)

## 2019-08-17 LAB — FERRITIN: Ferritin: 154 ng/mL (ref 11–307)

## 2019-08-17 SURGERY — ESOPHAGOGASTRODUODENOSCOPY (EGD) WITH PROPOFOL
Anesthesia: Monitor Anesthesia Care

## 2019-08-17 MED ORDER — METOPROLOL TARTRATE 25 MG PO TABS
25.0000 mg | ORAL_TABLET | Freq: Two times a day (BID) | ORAL | Status: DC
  Administered 2019-08-17 – 2019-08-18 (×2): 25 mg via ORAL
  Filled 2019-08-17 (×2): qty 1

## 2019-08-17 MED ORDER — FUROSEMIDE 10 MG/ML IJ SOLN
40.0000 mg | Freq: Once | INTRAMUSCULAR | Status: AC
  Administered 2019-08-17: 40 mg via INTRAVENOUS
  Filled 2019-08-17: qty 4

## 2019-08-17 MED ORDER — ALUM & MAG HYDROXIDE-SIMETH 200-200-20 MG/5ML PO SUSP
30.0000 mL | Freq: Once | ORAL | Status: AC
  Administered 2019-08-17: 30 mL via ORAL
  Filled 2019-08-17: qty 30

## 2019-08-17 MED ORDER — ASPIRIN EC 81 MG PO TBEC
81.0000 mg | DELAYED_RELEASE_TABLET | Freq: Every day | ORAL | Status: DC
  Administered 2019-08-17 – 2019-08-18 (×2): 81 mg via ORAL
  Filled 2019-08-17 (×2): qty 1

## 2019-08-17 MED ORDER — HYDROCODONE-ACETAMINOPHEN 5-325 MG PO TABS
1.0000 | ORAL_TABLET | ORAL | Status: DC | PRN
  Administered 2019-08-17: 2 via ORAL
  Administered 2019-08-17 – 2019-08-19 (×2): 1 via ORAL
  Filled 2019-08-17: qty 2
  Filled 2019-08-17 (×2): qty 1

## 2019-08-17 MED ORDER — SODIUM CHLORIDE 0.9 % IV SOLN
INTRAVENOUS | Status: AC
  Administered 2019-08-17: 02:00:00 via INTRAVENOUS

## 2019-08-17 MED ORDER — ACETAMINOPHEN 650 MG RE SUPP: 650.0000 mg | Freq: Four times a day (QID) | RECTAL | Status: DC | PRN

## 2019-08-17 MED ORDER — KETOROLAC TROMETHAMINE 15 MG/ML IJ SOLN: 15.0000 mg | Freq: Four times a day (QID) | INTRAMUSCULAR | Status: DC | PRN

## 2019-08-17 MED ORDER — ALBUTEROL SULFATE (2.5 MG/3ML) 0.083% IN NEBU: 2.5000 mg | INHALATION_SOLUTION | Freq: Four times a day (QID) | RESPIRATORY_TRACT | Status: DC | PRN

## 2019-08-17 MED ORDER — HEPARIN BOLUS VIA INFUSION
3000.0000 [IU] | Freq: Once | INTRAVENOUS | Status: AC
  Administered 2019-08-17: 3000 [IU] via INTRAVENOUS
  Filled 2019-08-17: qty 3000

## 2019-08-17 MED ORDER — HYDROMORPHONE HCL 1 MG/ML IJ SOLN
0.5000 mg | INTRAMUSCULAR | Status: DC | PRN
  Administered 2019-08-17 – 2019-08-20 (×7): 0.5 mg via INTRAVENOUS
  Filled 2019-08-17 (×7): qty 1

## 2019-08-17 MED ORDER — GABAPENTIN 100 MG PO CAPS
100.0000 mg | ORAL_CAPSULE | Freq: Three times a day (TID) | ORAL | Status: DC
  Administered 2019-08-17 – 2019-08-23 (×19): 100 mg via ORAL
  Filled 2019-08-17 (×19): qty 1

## 2019-08-17 MED ORDER — ALBUTEROL SULFATE HFA 108 (90 BASE) MCG/ACT IN AERS
2.0000 | INHALATION_SPRAY | Freq: Four times a day (QID) | RESPIRATORY_TRACT | Status: DC | PRN
  Filled 2019-08-17: qty 6.7

## 2019-08-17 MED ORDER — ONDANSETRON HCL 4 MG/2ML IJ SOLN
4.0000 mg | Freq: Four times a day (QID) | INTRAMUSCULAR | Status: DC | PRN
  Administered 2019-08-17 – 2019-08-22 (×12): 4 mg via INTRAVENOUS
  Filled 2019-08-17 (×12): qty 2

## 2019-08-17 MED ORDER — NITROGLYCERIN 0.4 MG SL SUBL
0.4000 mg | SUBLINGUAL_TABLET | SUBLINGUAL | Status: AC | PRN
  Administered 2019-08-17 (×3): 0.4 mg via SUBLINGUAL
  Filled 2019-08-17 (×3): qty 1

## 2019-08-17 MED ORDER — SUCRALFATE 1 G PO TABS
1.0000 g | ORAL_TABLET | Freq: Three times a day (TID) | ORAL | Status: DC
  Administered 2019-08-17 – 2019-08-20 (×11): 1 g via ORAL
  Filled 2019-08-17 (×11): qty 1

## 2019-08-17 MED ORDER — IOHEXOL 350 MG/ML SOLN
100.0000 mL | Freq: Once | INTRAVENOUS | Status: AC | PRN
  Administered 2019-08-17: 100 mL via INTRAVENOUS

## 2019-08-17 MED ORDER — ACETAMINOPHEN 325 MG PO TABS: 650.0000 mg | ORAL_TABLET | Freq: Four times a day (QID) | ORAL | Status: DC | PRN

## 2019-08-17 NOTE — ED Notes (Signed)
Patient transported to CT 

## 2019-08-17 NOTE — Progress Notes (Signed)
Pt was seen for mobility assessment and note her challenge to walk in a confined space and navigate obstacles.  Her plan is to get home with her family to help, and will get home therapy restarted.  Pt needs to use an AD, but is reluctant to commit to this.  Follow up acutely as her stay permits, and when home therapy discharges her may benefit from using outpatient services to complete her care.  08/17/19 1200  PT Visit Information  Last PT Received On 08/17/19  Assistance Needed +1  History of Present Illness 83 yo female with onset of HR up to 140 at home was admitted, now referred to PT for mobility ck.  Recent admit for PNA, susp aspiration.  Pt now returns and note chest pain chronically now, cleared for PE, covid (-).  PMHx:  COPD, CAD, cardiomegaly, RA, TIA, CHF, HTN, bipolar, atherosclerosis  Precautions  Precautions Fall  Precaution Comments purwick  Restrictions  Weight Bearing Restrictions No  Home Living  Family/patient expects to be discharged to: Private residence  Living Arrangements Alone  Available Help at Discharge Family;Available 24 hours/day  Type of Home House  Home Access Stairs to enter  Entrance Stairs-Number of Steps 1  Entrance Stairs-Rails None  Home Layout One level  Bathroom Shower/Tub Tub/shower unit;Curtain  Engineer, water - 2 wheels;Cane - single point;Tub bench;Grab bars - tub/shower  Prior Function  Level of Independence Independent  Comments driving and able to do outside work usually  Communication  Communication No difficulties  Pain Assessment  Pain Assessment Faces  Faces Pain Scale 4  Pain Location Chest upon standing  Pain Descriptors / Indicators Discomfort  Pain Intervention(s) Limited activity within patient's tolerance;Monitored during session;Premedicated before session;Repositioned  Cognition  Arousal/Alertness Awake/alert  Behavior During Therapy WFL for tasks assessed/performed  Overall Cognitive  Status Within Functional Limits for tasks assessed  General Comments pt objects to getting up until PT talked with nursing about her issues (added paper garment for her urine incontinence)  Upper Extremity Assessment  Upper Extremity Assessment Generalized weakness  Lower Extremity Assessment  Lower Extremity Assessment Generalized weakness  Cervical / Trunk Assessment  Cervical / Trunk Assessment Kyphotic  Bed Mobility  Overal bed mobility Needs Assistance  Bed Mobility Supine to Sit;Sit to Supine  Supine to sit Min guard  Sit to supine Min guard  General bed mobility comments adjusted bed and used railing  Transfers  Overall transfer level Needs assistance  Equipment used None  Transfers Sit to/from Stand  Sit to Stand Supervision  Ambulation/Gait  Ambulation/Gait assistance Min guard  Gait Distance (Feet) 20 Feet  Assistive device IV Pole  Gait Pattern/deviations Step-through pattern;Decreased stride length;Wide base of support  General Gait Details declined an assistive device  Gait velocity slowed  Gait velocity interpretation <1.31 ft/sec, indicative of household ambulator  Balance  Overall balance assessment Needs assistance  Sitting-balance support Feet supported  Sitting balance-Leahy Scale Good  Postural control Posterior lean  Standing balance support During functional activity  Standing balance-Leahy Scale Fair  Standing balance comment needs contact on IV pole with dynamic standing  General Comments  General comments (skin integrity, edema, etc.) O2 with tank during gait  Exercises  Exercises Other exercises  PT - End of Session  Equipment Utilized During Treatment Gait belt  Activity Tolerance Patient limited by fatigue;Other (comment) (limited by pain complaints)  Patient left in bed;with call bell/phone within reach  Nurse Communication Mobility status;Patient requests pain meds  PT Assessment  PT Recommendation/Assessment Patient needs continued PT  services  PT Visit Diagnosis Other abnormalities of gait and mobility (R26.89)  PT Problem List Decreased strength;Decreased balance;Decreased activity tolerance;Cardiopulmonary status limiting activity  Barriers to Discharge Inaccessible home environment  Barriers to Discharge Comments has a staired entrance  PT Plan  PT Frequency (ACUTE ONLY) Min 3X/week  PT Treatment/Interventions (ACUTE ONLY) DME instruction;Gait training;Stair training;Functional mobility training;Therapeutic activities;Therapeutic exercise;Balance training;Patient/family education;Neuromuscular re-education  AM-PAC PT "6 Clicks" Mobility Outcome Measure (Version 2)  Help needed turning from your back to your side while in a flat bed without using bedrails? 4  Help needed moving from lying on your back to sitting on the side of a flat bed without using bedrails? 4  Help needed moving to and from a bed to a chair (including a wheelchair)? 3  Help needed standing up from a chair using your arms (e.g., wheelchair or bedside chair)? 3  Help needed to walk in hospital room? 3  Help needed climbing 3-5 steps with a railing?  2  6 Click Score 19  Consider Recommendation of Discharge To: Home with [facility]  PT Recommendation  Follow Up Recommendations Home health PT;Supervision for mobility/OOB  PT equipment None recommended by PT  Individuals Consulted  Consulted and Agree with Results and Recommendations Patient  Acute Rehab PT Goals  Patient Stated Goal feel less pain, restart home therapy  PT Goal Formulation With patient  Time For Goal Achievement 08/31/19  Potential to Achieve Goals Good  PT Time Calculation  PT Start Time (ACUTE ONLY) 1042  PT Stop Time (ACUTE ONLY) 1107  PT Time Calculation (min) (ACUTE ONLY) 25 min  PT General Charges  $$ ACUTE PT VISIT 1 Visit  PT Evaluation  $PT Eval Moderate Complexity 1 Mod  PT Treatments  $Gait Training 8-22 mins  Written Expression  Dominant Hand Right    Mee Hives, PT  MS Acute Rehab Dept. Number: [place]

## 2019-08-17 NOTE — Care Management (Signed)
08-17-19 1550 CM received consult for DOAC- CM submitted benefits check for Xarelto and Eliquis. CM will continue to follow for additional transition of care needs. Bethena Roys, RN,BSN Case Manager [phone]

## 2019-08-17 NOTE — Progress Notes (Signed)
  Echocardiogram 2D Echocardiogram has been performed.  Julie Rice L Androw 08/17/2019, 9:00 AM

## 2019-08-17 NOTE — Consult Note (Addendum)
Cardiology Consultation:   Patient ID: Julie Rice MRN: [id number]; DOB: 1936-09-02  Admit date: 08/16/2019 Date of Consult: 08/17/2019  Primary Care Provider: Shirline Frees, NP Primary Cardiologist: Charlton Haws, MD  Primary Electrophysiologist:  None   Patient Profile:   Julie Rice is a 83 y.o. female with a hx of systolic CHF, prolonged QT, hypertension, hyperlipidemia, fibromyalgia, esophageal stricture, ;arge symptomatic hiatal hernia s/p fundoplication, chronic dysphagia, asthma/COPD 2L O2 at baseline, CAD, TIA, bipolar, RA who is being seen today for the evaluation of chest pain and new onset afib RVR at the request of Dr. Denton Lank.  History of Present Illness:   Patient had seen Dr. Eden Emms in 2011 for lower extremity edema. EF at that was normal and cath was negative for CAD. At that time she had chronic sob and chronic GERD and mild CPOD. Patient was lost to follow-up.  Patient was admitted 07/22/19 - 07/27/19 for acute on chronic shortness of breath treated for new onset of acute CHF (worse EF 35-40%). Cardiac cath 9/21 showed nonobstructive CAD.   Patient was admitted 08/07/19 - 08/13/19 for sob and chest pain found to have lower lobe pneumonia thought to be secondary to aspiration. COVID negative. She was treated with abx. GI was consulted for esophageal stenosis and underwent balloon dilation and Botox injections. No ischemic work-up was pursued at that time. Patient was discharged with home health and PT.   Ms. Arch presented to [facility] ED 08/16/19 for chest pain that had been intermittent for the last several weeks. Chest pain is described as a burning sensation that starts in the epigastric area and radiates to the back around the left side. She first noticed it in September when it was very severe. Since then the pain has minimally improved. The burning is worse with swallowing and worse in the morning. Patient has been able to swallow normally but does feel like food  gets stuck in the back of her throat. She also reports weakness and shortness of breath that feels worse on exertion. She has chronic shortness of breath at baseline. She was seen by her home health nurse today who noted irregular heart rates 60-140 bpm and told her to go in to the ED. Patient reported a history of tachycardia during a hospitalization and was given a medicine to improve the heart rate, but no reported afib.The patient took 2 NTG and aspirin before coming to the ED. Labs in the ED showed potassium 3.8, Glucose 96, creatinine 0.68, magnesium 1.9. Hs troponin was 20 > 35 > 32 > 32. WBC 9.3, Hgb 9.3.   The patient lives alone and is able to perform ADLs. She denies recent lower leg edema or worsening orthopnea. She has been on lasix during hospital admissions but is not taking it at home. She denies tobacco/alcohol/drug use. Father died of MI in his 29s.   Heart Pathway Score:     Past Medical History:  Diagnosis Date   Anemia    Anxiety    Bipolar disorder (HCC)    Blood transfusion 1991   autologous pts own blood given    CAD (coronary artery disease)    Cataract    Chest pain    "@ rest, lying down, w/exertion"   Chronic back pain    "mostly lower back but I do have upper back pain regularly" (04/06/2018)   COPD (chronic obstructive pulmonary disease) (HCC)    Depression    Esophageal stricture    Fibromyalgia    GERD (  gastroesophageal reflux disease)    Heart murmur    "slight" (04/06/2018)   Hiatal hernia    Hyperlipidemia    Patient denies   Hypertension    Internal hemorrhoids    Lumbago    Mitral regurgitation    Osteoarthritis    Osteoporosis    Pneumonia ~ 01/2018   PONV (postoperative nausea and vomiting)    severe ponv, "in the past" (04/06/2018)   Rectal bleeding    Rheumatoid arthritis ([place])    Spondylosis    TIA (transient ischemic attack) 2013   Urinary incontinence    wears depends    Past Surgical History:  Procedure  Laterality Date   ABDOMINAL HYSTERECTOMY  1982   BACK SURGERY     BALLOON DILATION N/A 09/21/2018   Procedure: BALLOON DILATION;  Surgeon: Irene Shipper, MD;  Location: [facility] ENDOSCOPY;  Service: Endoscopy;  Laterality: N/A;   BALLOON DILATION N/A 08/12/2019   Procedure: BALLOON DILATION;  Surgeon: Jackquline Denmark, MD;  Location[facility] ENDOSCOPY;  Service: Endoscopy;  Laterality: N/A;   BIOPSY  08/12/2019   Procedure: BIOPSY;  Surgeon: Jackquline Denmark, MD;  Location: [place];  Service: Endoscopy;;   BLADDER SUSPENSION  1980's   BOTOX INJECTION N/A 09/21/2018   Procedure: BOTOX INJECTION;  Surgeon: Irene Shipper, MD;  Location: [facility] ENDOSCOPY;  Service: Endoscopy;  Laterality: N/A;   BOTOX INJECTION N/A 08/12/2019   Procedure: BOTOX INJECTION;  Surgeon: Jackquline Denmark, MD;  Location: [facility] ENDOSCOPY;  Service: Endoscopy;  Laterality: N/A;   CATARACT EXTRACTION W/ INTRAOCULAR LENS  IMPLANT, BILATERAL Bilateral 2010   DILATION AND CURETTAGE OF UTERUS  1961   ESOPHAGEAL MANOMETRY N/A 03/25/2018   Procedure: ESOPHAGEAL MANOMETRY (EM);  Surgeon: Mauri Pole, MD;  Location: [facility] ENDOSCOPY;  Service: Endoscopy;  Laterality: N/A;   ESOPHAGOGASTRODUODENOSCOPY (EGD) WITH PROPOFOL N/A 04/07/2018   Procedure: ESOPHAGOGASTRODUODENOSCOPY (EGD) WITH PROPOFOL;  Surgeon: Doran Stabler, MD;  Location: [place];  Service: Gastroenterology;  Laterality: N/A;   ESOPHAGOGASTRODUODENOSCOPY (EGD) WITH PROPOFOL N/A 09/21/2018   Procedure: ESOPHAGOGASTRODUODENOSCOPY (EGD) WITH PROPOFOL;  Surgeon: Irene Shipper, MD;  Location: [facility] ENDOSCOPY;  Service: Endoscopy;  Laterality: N/A;   ESOPHAGOGASTRODUODENOSCOPY (EGD) WITH PROPOFOL N/A 08/12/2019   Procedure: ESOPHAGOGASTRODUODENOSCOPY (EGD) WITH PROPOFOL;  Surgeon: Jackquline Denmark, MD;  Location: [facility] ENDOSCOPY;  Service: Endoscopy;  Laterality: N/A;   HERNIA REPAIR     HIATAL HERNIA REPAIR N/A 08/02/2016   Procedure: LAPAROSCOPIC REPAIR OF LARGE  HIATAL HERNIA;   Surgeon: Excell Seltzer, MD;  Location: [facility] ORS;  Service: General;  Laterality: N/A;   JOINT REPLACEMENT     LAPAROSCOPIC NISSEN FUNDOPLICATION N/A 8/33/8250   Procedure: LAPAROSCOPIC NISSEN FUNDOPLICATION;  Surgeon: Excell Seltzer, MD;  Location: [facility] ORS;  Service: General;  Laterality: N/A;   [place]; 1994; [id number]   "I've got 2 stainless steel rods; 6 screws; 2 ray cages"took bone from right hip to put in back   RIGHT/LEFT HEART CATH AND CORONARY ANGIOGRAPHY N/A 07/26/2019   Procedure: RIGHT/LEFT HEART CATH AND CORONARY ANGIOGRAPHY;  Surgeon: Belva Crome, MD;  Location: [place] CV LAB;  Service: Cardiovascular;  Laterality: N/A;   TONSILLECTOMY AND ADENOIDECTOMY  1945   TOTAL KNEE ARTHROPLASTY Right ~ [place]  ~ 1976     Home Medications:  Prior to Admission medications   Medication Sig Start Date End Date Taking? Authorizing Provider  acetaminophen (TYLENOL) 325 MG tablet Take 2 tablets (650 mg total) by mouth every  4 (four) hours as needed for headache or mild pain. 07/27/19  Yes Drema DallasWoods, Curtis J, MD  albuterol (VENTOLIN HFA) 108 (90 Base) MCG/ACT inhaler Inhale 2 puffs into the lungs every 6 (six) hours as needed for wheezing or shortness of breath. 02/23/19  Yes [name], Tammy S, NP  amLODipine (NORVASC) 2.5 MG tablet Take 2.5 mg by mouth daily.   Yes [provider]  aspirin EC 81 MG EC tablet Take 1 tablet (81 mg total) by mouth daily. 07/28/19  Yes Drema DallasWoods, Curtis J, MD  carvedilol (COREG) 3.125 MG tablet TAKE 1 TABLET BY MOUTH TWICE A DAY WITH A MEAL Patient taking differently: Take 3.125 mg by mouth 2 (two) times daily with a meal.  07/30/19  Yes Nafziger, Kandee Keenory, NP  clonazePAM (KLONOPIN) 0.5 MG tablet TAKE 1 TABLET BY MOUTH TWICE A DAY AS NEEDED FOR ANXIETY Patient taking differently: Take 0.5 mg by mouth 2 (two) times daily as needed for anxiety.  05/12/19  Yes Nafziger, Kandee Keenory, NP  ferrous sulfate 325 (65 FE) MG tablet Take 1  tablet (325 mg total) by mouth daily with breakfast. 07/28/19  Yes Drema DallasWoods, Curtis J, MD  gabapentin (NEURONTIN) 100 MG capsule TAKE 1 CAPSULE BY MOUTH THREE TIMES A DAY Patient taking differently: Take 100 mg by mouth 3 (three) times daily. TAKE 1 CAPSULE BY MOUTH THREE TIMES A DAY 07/13/19  Yes Nafziger, Kandee Keenory, NP  guaiFENesin-dextromethorphan (ROBITUSSIN DM) 100-10 MG/5ML syrup Take 10 mLs by mouth every 6 (six) hours as needed for cough. 08/13/19  Yes Burnadette PopAdhikari, Amrit, MD  loperamide (IMODIUM) 2 MG capsule Take 1 capsule (2 mg total) by mouth as needed for diarrhea or loose stools. 08/13/19  Yes Burnadette PopAdhikari, Amrit, MD  losartan-hydrochlorothiazide (HYZAAR) 100-25 MG tablet Take 1 tablet by mouth daily. 07/30/19  Yes [provider]  meloxicam (MOBIC) 15 MG tablet Take 1 tablet (15 mg total) by mouth daily. 11/05/18  Yes Nafziger, Kandee Keenory, NP  Multiple Vitamin (MULTIVITAMIN) tablet Take 1 tablet by mouth daily. Vitamin supplement. 02/07/12  Yes Viviann SpareScott, Margaret A, FNP  pantoprazole (PROTONIX) 40 MG tablet Take 1 tablet (40 mg total) by mouth 2 (two) times daily. 08/31/18  Yes Tressia DanasBeavers, Kimberly, MD  sacubitril-valsartan (ENTRESTO) 24-26 MG Take 1 tablet by mouth 2 (two) times daily. 07/27/19  Yes Drema DallasWoods, Curtis J, MD  sertraline (ZOLOFT) 100 MG tablet TAKE 1 TABLET BY MOUTH EVERYDAY AT BEDTIME Patient taking differently: Take 100 mg by mouth at bedtime.  07/16/19  Yes Nafziger, Kandee Keenory, NP  spironolactone (ALDACTONE) 25 MG tablet Take 0.5 tablets (12.5 mg total) by mouth daily. 07/28/19  Yes Drema DallasWoods, Curtis J, MD  sucralfate (CARAFATE) 1 g tablet Take 1 tablet (1 g total) by mouth 4 (four) times daily. Dissolve tablet in 10 ml warm water Patient taking differently: Take 1 g by mouth 4 (four) times daily as needed (heartburn). Dissolve tablet in 10 ml warm water 08/31/18  Yes Beavers, Cala BradfordKimberly, MD  tetrahydrozoline 0.05 % ophthalmic solution Place 1 drop into both eyes daily.   Yes [provider]     Inpatient Medications: Scheduled Meds:  aspirin EC  81 mg Oral Daily   gabapentin  100 mg Oral TID   sucralfate  1 g Oral TID WC & HS   Continuous Infusions:  diltiazem (CARDIZEM) infusion 12.5 mg/hr (08/17/19 0847)   heparin 1,150 Units/hr (08/17/19 1126)   PRN Meds: acetaminophen **OR** acetaminophen, albuterol, HYDROcodone-acetaminophen, HYDROmorphone (DILAUDID) injection, ondansetron (ZOFRAN) IV  Allergies:    Allergies  Allergen  Reactions   Morphine Other (See Comments)    Flushing, rash, itching   Tape Other (See Comments)    Band aides, adhesive tape Redness and pulls skin off    Social History:   Social History   Socioeconomic History   Marital status: Widowed    Spouse name: Not on file   Number of children: 2   Years of education: Not on file   Highest education level: Not on file  Occupational History    Employer: RETIRED  Social Needs   Financial resource strain: Not on file   Food insecurity    Worry: Not on file    Inability: Not on file   Transportation needs    Medical: Not on file    Non-medical: Not on file  Tobacco Use   Smoking status: Never Smoker   Smokeless tobacco: Never Used  Substance and Sexual Activity   Alcohol use: Never    Frequency: Never   Drug use: Never   Sexual activity: Not Currently  Lifestyle   Physical activity    Days per week: Not on file    Minutes per session: Not on file   Stress: Not on file  Relationships   Social connections    Talks on phone: Not on file    Gets together: Not on file    Attends religious service: Not on file    Active member of club or organization: Not on file    Attends meetings of clubs or organizations: Not on file    Relationship status: Not on file   Intimate partner violence    Fear of current or ex partner: Not on file    Emotionally abused: Not on file    Physically abused: Not on file    Forced sexual activity: Not on file  Other Topics Concern    Not on file  Social History Narrative   Retired    Widowed    Family History:   Family History  Problem Relation Age of Onset   Kidney disease Mother    Hypertension Mother    Heart disease Father 31       die of MI at age 52   Suicidality Son    Heart disease Brother    Heart attack Brother    Colon cancer Neg Hx    Esophageal cancer Neg Hx    Pancreatic cancer Neg Hx    Rectal cancer Neg Hx    Stomach cancer Neg Hx      ROS:  Please see the history of present illness.  All other ROS reviewed and negative.     Physical Exam/Data:   Vitals:   08/17/19 1000 08/17/19 1030 08/17/19 1329 08/17/19 1415  BP: 116/72 118/65 (!) 116/94 127/77  Pulse: (!) 40 83 100 85  Resp: Temp:    97.7 F (36.5 C)  TempSrc:    Oral  SpO2: 100% 100% 98% 98%  Weight:      Height:       No intake or output data in the 24 hours ending 08/17/19 1418 Last 3 Weights 08/16/2019 08/16/2019 08/13/2019  Weight (lbs) 135 lb 65 lb 11.2 oz 65 lb 11.2 oz  Weight (kg) 61.236 kg 29.8 kg 29.8 kg     Body mass index is 23.91 kg/m.  General:  Well nourished, well developed, in no acute distress HEENT: normal Lymph: no adenopathy Neck: no JVD Endocrine:  No thryomegaly Vascular: No carotid bruits; FA  pulses 2+ bilaterally without bruits  Cardiac:  normal S1, S2; RRR; systolic murmur  Lungs:  Mild crackles bilaterally, no wheezing, rhonchi or rales  Abd: soft, nontender, no hepatomegaly  Ext: no edema Musculoskeletal:  No deformities, BUE and BLE strength normal and equal Skin: warm and dry  Neuro:  CNs 2-12 intact, no focal abnormalities noted Psych:  Normal affect   EKG:  The EKG was personally reviewed and demonstrates:  Afib RVR, 102 bpm, known LBBB, no ST/T wave changes Telemetry:  Telemetry was personally reviewed and demonstrates:  Afib, HR in the 80s  Relevant CV Studies:  Echo 08/17/2019 1. Left ventricular ejection fraction, by visual estimation, is 45 to 50%.  Assessment of LV function complicated by atrial fibrillation, but overall appears mildly decreased global systolic function. Normal left ventricular size. There is mildly  increased left ventricular hypertrophy.  2. Left ventricular diastolic Doppler parameters are indeterminate pattern of LV diastolic filling.  3. Global right ventricle has normal systolic function.The right ventricular size is normal. No increase in right ventricular wall thickness.  4. Left atrial size was severely dilated.  5. Right atrial size was moderately dilated.  6. Moderate pleural effusion in the left lateral region.  7. Trivial pericardial effusion is present.  8. The mitral valve is abnormal. Mild to moderate mitral valve regurgitation. EROA 0.15, RV 23cc  9. The tricuspid valve is normal in structure. Tricuspid valve regurgitation is mild. 10. The aortic valve is tricuspid Aortic valve regurgitation is mild by color flow Doppler. 11. Mildly elevated pulmonary artery systolic pressure. RVSP 42 mmHg 12. The inferior vena cava is dilated in size with >50% respiratory variability, suggesting right atrial pressure of 8 mmHg.  07/26/19  Right dominant coronary anatomy  Widely patent left main  30 to 40% mid LAD  Widely patent circumflex  Luminal irregularities in a dominant right coronary.  Right heart pressures are relatively low.  Pulmonary wedge pressure mean is 2 mmHg.  LVEDP 4 mmHg.  Left ventriculography by hand-injection was not helpful.  From images obtained, LV function appears relatively normal.  RECOMMENDATIONS:   Per treating team.  Relatively volume contracted and therefore we will give normal saline 100 cc/h for the next 4 hours.  Laboratory Data:  High Sensitivity Troponin:   Recent Labs  Lab 08/11/19 0049 08/16/19 2035 08/16/19 2212 08/17/19 0030 08/17/19 0223  TROPONINIHS 20* 35* 32* 32* 30*     Chemistry Recent Labs  Lab 08/13/19 0443 08/16/19 2035 08/17/19 0400  NA  142 139 141  K 3.8 3.8 3.8  CL 107 103 103  CO2 27 26 25   GLUCOSE 93 103* 96  BUN <5* 11 8  CREATININE 0.57 0.85 0.68  CALCIUM 8.4* 8.6* 8.3*  GFRNONAA >60 >60 >60  GFRAA >60 >60 >60  ANIONGAP 8 10 13     Recent Labs  Lab 08/17/19 0400  PROT 5.7*  ALBUMIN 2.9*  AST 18  ALT 17  ALKPHOS 62  BILITOT 0.6   Hematology Recent Labs  Lab 08/12/19 0912 08/16/19 2035 08/17/19 0400  WBC 9.8 9.7 9.3  RBC 3.44* 3.10* 3.23*   3.23*  HGB 9.7* 9.0* 9.3*  HCT 30.5* 28.1* 30.0*  MCV 88.7 90.6 92.9  MCH 28.2 29.0 28.8  MCHC 31.8 32.0 31.0  RDW 14.9 16.0* 15.8*  PLT 348 305 303   BNPNo results for input(s): BNP, PROBNP in the last 168 hours.  DDimer  Recent Labs  Lab 08/17/19 0030  DDIMER 1.85*  Radiology/Studies:  Dg Chest Portable 1 View  Result Date: 08/16/2019 CLINICAL DATA:  83 year old female with chest pain. EXAM: PORTABLE CHEST 1 VIEW COMPARISON:  Chest radiograph and CT dated 08/07/2019 dated 07/22/2019 FINDINGS: Small bilateral pleural effusions with bibasilar atelectasis versus infiltrate. Significant interval improvement of left pulmonary airspace opacity seen on the radiograph of 08/07/2019. Mild cardiomegaly. Atherosclerotic calcification of the aortic arch. No acute osseous pathology. IMPRESSION: 1. Small bilateral pleural effusions with bibasilar atelectasis versus infiltrate. 2. Significant interval improvement of the left pulmonary airspace opacity seen on the radiograph of 08/07/2019. Electronically Signed   By: Elgie Collard M.D.   On: 08/16/2019 21:06   Ct Angio Chest/abd/pel For Dissection W And/or W/wo  Result Date: 08/17/2019 CLINICAL DATA:  Chest pain, shortness of breath. Recent esophageal dilatation. EXAM: CT ANGIOGRAPHY CHEST, ABDOMEN AND PELVIS TECHNIQUE: Multidetector CT imaging through the chest, abdomen and pelvis was performed using the standard protocol during bolus administration of intravenous contrast. Multiplanar reconstructed images and  MIPs were obtained and reviewed to evaluate the vascular anatomy. CONTRAST:  OMNIPAQUE IOHEXOL 350 MG/ML SOLN COMPARISON:  CTA chest 07/22/2019 FINDINGS: CTA CHEST FINDINGS Cardiovascular: Aortic atherosclerosis. No aneurysm or dissection. Cardiomegaly. No filling defects in the pulmonary arteries to suggest pulmonary emboli. Mediastinum/Nodes: The esophagus is dilated and fluid-filled, similar to prior study. No mediastinal, hilar, or axillary adenopathy. Lungs/Pleura: Biapical scarring. Moderate bilateral pleural effusions have increased in size since prior study. Compressive atelectasis in both lower lobes. Areas of scarring in the right middle lobe and lingula. Scattered ground-glass nodular densities are stable. Musculoskeletal: Chest wall soft tissues are unremarkable. No acute bony abnormality. Review of the MIP images confirms the above findings. CTA ABDOMEN AND PELVIS FINDINGS VASCULAR Aorta: Aortic atherosclerosis.  No aneurysm or dissection. Celiac: Patent without evidence of aneurysm, dissection, vasculitis or significant stenosis. SMA: Patent without evidence of aneurysm, dissection, vasculitis or significant stenosis. Renals: Both renal arteries are patent without evidence of aneurysm, dissection, vasculitis, fibromuscular dysplasia or significant stenosis. IMA: Patent without evidence of aneurysm, dissection, vasculitis or significant stenosis. Inflow: Atherosclerotic calcifications. No aneurysm, stenosis or dissection. Veins: No obvious venous abnormality within the limitations of this arterial phase study. Review of the MIP images confirms the above findings. NON-VASCULAR Hepatobiliary: 1 No focal hepatic abnormality. Gallbladder unremarkable. Pancreas: No focal abnormality or ductal dilatation. Spleen: No focal abnormality.  Normal size. Adrenals/Urinary Tract: No adrenal abnormality. No focal renal abnormality. No stones or hydronephrosis. Urinary bladder is unremarkable. Stomach/Bowel:  Scattered sigmoid diverticulosis. No active diverticulitis. Stomach and small bowel decompressed, unremarkable. Lymphatic: No adenopathy Reproductive: Prior hysterectomy.  No adnexal masses. Other: No free fluid or free air. Musculoskeletal: Postoperative changes in the lumbar spine. No acute bony abnormality. Review of the MIP images confirms the above findings. IMPRESSION: No evidence of aortic aneurysm or dissection. No evidence of pulmonary embolus. Moderate bilateral pleural effusions. Compressive atelectasis in the lower lobes. Scattered reticulonodular densities and ground-glass nodular densities throughout the lungs are stable. Dilated, fluid-filled esophagus is stable. No acute findings in the abdomen or pelvis. Aortic atherosclerosis, cardiomegaly. Electronically Signed   By: Charlett Nose M.D.   On: 08/17/2019 08:41    Assessment and Plan:   New onset Afib RVR  Patient reports intermittent chest pain for the last several weeks with worsening SOB and weakness. She had LHC on 07/26/19 that showed nonobstructive CAD. Home health nurse noted irregular rhythm on physical exam. In the ED noted to be in afib RVR. Denies palpitations. Patient is not anticoagulated.  Started on IV cardizem for rate control.  - HS troponin mildly elevated to 32 - EKG showed afib with known LBBB, HR 102 - EF this admission improved to 45-50% - Stop IV cardizem >> start Metoprolol for rate control.  - CHADSVASC score = 5 (CHF, HTN, TIA, age, female) - Patient was started on heparin. In the setting of chest pain possible GI related would not start a DOAC until decision is made regarding GI work-up.  - TSH 1.654 - K+ 3.8 and Mag 1.9 - Would consider TEE/DCCV later this week  Chest pain/Nonobstructive CAD - LHC 07/26/19 showed 30-40% stenosis of the mid LAD - Hs troponin 32 > 32 > 30 - Chest pain likely due to afib RVR or GI etiology>>GI to see - No further ischemic evaluation at this time.   Acute on Chronic systolic  CHF (EF CXR on admission showed small bilateral pleural effusion. Patient appeared to be dry and was given IVF on admission. CT chest showed moderate pleural effusions. - EF this admission improved to 45-50% - Coreg and Entresto home meds were held for soft pressures - Will stop IV cardizem and add Metoprolol 25 mg BID with plan to add Entresto if pressures tolerate - Will give IV lasix 40 mg once - BMET in the AM - Daily weights and stric I&Os  Moderate MR - Noted on echo - Will plan to evaluate at TEE  HTN - Norvasc and coreg and entresto home meds>> held due to softer pressures - Will add on metoprolol with the plan to add entresto if pressures allow  Hyperlipidemia - LDL 131 04/25/2017 - Not on medication at baseline  Prolonged Qt - Monitor - QTc this admission 563  COPD - On 2L O2 at baseline     For questions or updates, please contact [facility] Please consult [email or web address] for contact info under     Signed, Cadence David Stall, PA-C  08/17/2019 2:18 PM   I have examined the patient and reviewed assessment and plan and discussed with patient.  Agree with above as stated.    New onset AFib with RVR in a patient with nonischemic cardiomyopathy.  Also with mild to moderate MR on echo, severely dilated LA and chronic lung disease requiring supplemental oxygen.  I suspect her heart failure was worsened by loss of her atrial kick.  1. IV heparin for stroke prevention until GI procedure/eval completed. 2.  Rate control with metoprolol rather than IV CArdizem. 3. Plan for TEE/cardioversion on Thursday.  I suspect she will need AAD.  Amio and Tikosyn could be considered given her NICM.  However, with lung disease, Tikosyn may be preferable.  Will check with EP service tomorrow.   4.  For now, IV Lasix for heart failure treatment.  Restart Entresto as well as BP and renal function allows.   Lance Muss

## 2019-08-17 NOTE — ED Notes (Signed)
MD at bedside. 

## 2019-08-17 NOTE — Progress Notes (Signed)
ANTICOAGULATION CONSULT NOTE  Pharmacy Consult for Heparin Indication: chest pain/Afib  Patient Measurements: Height: 5\' 3"  (160 cm) Weight: 144 lb 14.4 oz (65.7 kg) IBW/kg (Calculated) : 52.4  Vital Signs: Temp: 97.8 F (36.6 C) (10/13 2046) Temp Source: Oral (10/13 2046) BP: 114/70 (10/13 2046) Pulse Rate: 100 (10/13 2046)  Labs: Recent Labs    08/16/19 2035 08/16/19 2212 08/17/19 0030 08/17/19 0223 08/17/19 0400 08/17/19 0855 08/17/19 2020  HGB 9.0*  --   --   --  9.3*  --   --   HCT 28.1*  --   --   --  30.0*  --   --   PLT 305  --   --   --  303  --   --   HEPARINUNFRC  --   --   --   --   --  <0.10* 0.41  CREATININE 0.85  --   --   --  0.68  --   --   TROPONINIHS 35* 32* 32* 30*  --   --   --     Estimated Creatinine Clearance: 50.2 mL/min (by C-G formula based on SCr of 0.68 mg/dL).  Assessment: 83 y.o. female with chest pain and afib continues on IV heparin.   Heparin level this evening is at goal. CBC is stable and no bleeding noted.   Goal of Therapy:  Heparin level 0.3-0.7 units/ml Monitor platelets by anticoagulation protocol: Yes   Plan:  Heparin gtt at 1150 units/hr Daily heparin level and CBC  Erin Hearing PharmD., BCPS Clinical Pharmacist 08/17/2019 9:24 PM

## 2019-08-17 NOTE — ED Notes (Signed)
Pt began vomiting after last dose of hydrocodone. MD aware.

## 2019-08-17 NOTE — Progress Notes (Signed)
ANTICOAGULATION CONSULT NOTE  Pharmacy Consult for Heparin Indication: chest pain/Afib  Patient Measurements: Height: 5\' 3"  (160 cm) Weight: 135 lb (61.2 kg) IBW/kg (Calculated) : 52.4  Vital Signs: BP: 118/65 (10/13 1030) Pulse Rate: 83 (10/13 1030)  Labs: Recent Labs    08/16/19 2035 08/16/19 2212 08/17/19 0030 08/17/19 0223 08/17/19 0400 08/17/19 0855  HGB 9.0*  --   --   --  9.3*  --   HCT 28.1*  --   --   --  30.0*  --   PLT 305  --   --   --  303  --   HEPARINUNFRC  --   --   --   --   --  <0.10*  CREATININE 0.85  --   --   --  0.68  --   TROPONINIHS 35* 32* 32* 30*  --   --     Estimated Creatinine Clearance: 45.6 mL/min (by C-G formula based on SCr of 0.68 mg/dL).  Assessment: 83 y.o. female with chest pain and afib continues on IV heparin. Initial heparin level is undetectable. Heparin has been running well with no interruptions or issues with the IV line. CBC is stable and no bleeding noted.   Goal of Therapy:  Heparin level 0.3-0.7 units/ml Monitor platelets by anticoagulation protocol: Yes   Plan:  Repeat heparin bolus of 3000 units IV x 1 Increase heparin gtt to 1150 units/hr Check an 8 hr heparin level Daily heparin level and CBC  Lala Been, Rande Lawman 08/17/2019,10:57 AM

## 2019-08-17 NOTE — ED Notes (Signed)
Pt transported to CT ?

## 2019-08-17 NOTE — TOC Benefit Eligibility Note (Signed)
Transition of Care [facility]) Benefit Eligibility Note    Patient Details  Name: Julie Rice MRN: [id number] Date of Birth: 1936/06/04   Medication/Dose: Xarelto 20 mg po daily  Covered?: Yes  Tier: 3 Drug  Prescription Coverage Preferred Pharmacy: Any retail Pharmacy  Spoke with Person/Company/Phone Number:: Kassie  Co-Pay: 47.00 for a 30 day supply 74.00 for a 90 day supply mail order  Prior Approval: No  Deductible: (No Deductible)  Additional Notes: Eliquis 2.5 mg/5 mg po twice daily 47.00 60 for 30 only is covered as well same condition as above    [employer] Number: 08/17/2019, 4:24 PM

## 2019-08-17 NOTE — ED Notes (Signed)
Patient's chest pain remains unrelieved at this time, hospitalist paged.

## 2019-08-17 NOTE — ED Notes (Signed)
Pt states her pain is 10/10 . Begins in mid chest and radiates to the left of her back. Pt is nauseous and vomiting at this time.

## 2019-08-17 NOTE — Progress Notes (Signed)
PROGRESS NOTE    Julie Rice  [id number] DOB: 08/03/1936 DOA: 08/16/2019 PCP: [name], [name], NP    Brief Narrative:  83 y.o. female with medical history significant of hypertension, hyperlipidemia, systolic CHF, fibromyalgia, anemia, esophageal stricture s/p balloon dilation, large symptomatic hiatal hernia status post fundoplication, chronic dysphagia, asthma/COPD on oxygen at 2 L at home, CAD, TIA, bipolar disorder fibromyalgia, rheumatoid arthritis, TIA, chronic urinary incontinence.    She presented with heart rate 60s-140 at home per home health nurse.  Patient has not filled her medications since her discharge with still having some intermittent chest pain and she was told to come to emergency department.  She reports while hospitalized she has an episode of tachycardia with HR p to 170's she was given a medicine and then her HR has come down.  She has no known hx of A.fib  She took 2 nitro and 3/24 of aspirin and presented to emergency department.  Chest pain report on and off for weeks, is worse with swallowing.  Has been swallowing soft food ok.  Reported burning pain in her chest radiating to her back and arms.  She felt very weak and fell back wards no LOC.  No fever no chills.  Last night she felt sweaty her temp was 99.9.  Reports Shortness of breath since last summer but has been getting progressively worse.    Relative recent history: Initially admitted on 10/3/20200 with shortness of breath and chest pain was found to have lower lobe pneumonia thought to be secondary to aspiration COVID negative treated with broad-spectrum antibiotics there was worrisome for esophageal stenosis GI was consulted and she underwent balloon dilatation and Botox injection Physical therapy seen her and recommended home health and discharge and she was discharged home on 9 October. EKG showed no evidence of ischemic changes thought to be secondary to demand ischemia.   Assessment & Plan:    Principal Problem:   Atrial fibrillation with RVR (HCC) Active Problems:   Chest pain   Prolonged QT interval   Essential hypertension   ESOPHAGEAL STRICTURE   CHF (congestive heart failure) (HCC)   Chronic respiratory failure with hypoxia (HCC)   COPD (chronic obstructive pulmonary disease) (HCC)   Elevated troponin   Hypothyroidism   Dyslipidemia   UNSPECIFIED ANEMIA   Hiatal hernia with gastroesophageal reflux   Esophageal dysmotility   Asthma   Acute on chronic respiratory failure with hypoxia (HCC)   Atrial fibrillation with RVR - new onset. CHA2D-VASC score 5.  TSH within normal limits (free T4 slightly up).  Echo 10/12 EF 45-50%, severe LAE, mild/mod MR, moderaly dilated RA.   - heparin drip - continue - Cardizem drip - continue - Check TSH - Cardiology on board  Chest pain -suspect this is GI/esophageal in origin given recent dilation for stricture/stenosis.  Patient had 10/10 pain radiation to the back.  Was concerned for complication of the dilation vs aortic dissection.  Obtained CTA which ruled out both but did show a dilated and fluid filled esophagus (stable from prior).  Had recent cardiac catheterization which  is reassuring.   - consider GI consult - home Carafate  - low dose Dilaudid PRN  Elevated Troponin - likely demand ischemia secondary to above.  Patient had left heart cath in Sept 2020 that show non-obstructive CAD.  Hypothyroidism - -stable, TSH wnl  Dyslipidemia - chronic stable  UNSPECIFIED ANEMIA - iron panel WNL  Essential hypertension - somewhat soft bp while in a.fib w RVR -  home meds held  GERD - chronic stable  Prolonged QT interval - - will monitor on tele  - avoid QT prolonging medications - maintain K>4, Mg>2   Hx of CHF- systolic CHF hold Entresto for tonight given soft BP pressures. Patient appears to be on a dry side will gently rehydrate. - monitor volume status closely  Chronic respiratory failure with hypoxia  (HCC)-  Currently at baseline continue to monitor continue home oxygen Given increase dyspnea and tachycardia will check d.dimer  Asthma - not acutely exacerbated.  patient states she never smoked.  Continue home medications currently stable   Other plan as per orders.   DVT prophylaxis: heparin Code Status:   Code Status: Full Code  Family Communication: none at bedside Disposition Plan: pending clinical improvement,  likely home (vs SNF based on PT recs). Est 2-3 days.   Consultants:   Cardiology  Procedures:   ECHO 10/12 - EF  45-50%  Antimicrobials: none   Subjective: Patient seen and examined.  Sitting up in bed with emesis bag on her lap.  She reports persistent chest pain despite Norco given (it caused N/V).  Nausea improved with Zofran.  Chest pain waxes and wanes and wraps around both sides to her back when severe.  No fever/chills.  Objective: Vitals:   08/17/19 1000 08/17/19 1030 08/17/19 1329 08/17/19 1415  BP: 116/72 118/65 (!) 116/94 127/77  Pulse: (!) 40 83 100 85  Resp: 14 15 17    Temp:    97.7 F (36.5 C)  TempSrc:    Oral  SpO2: 100% 100% 98% 98%  Weight:    65.7 kg  Height:    5\' 3"  (1.6 m)    Intake/Output Summary (Last 24 hours) at 08/17/2019 1713 Last data filed at 08/17/2019 1501 Gross per 24 hour  Intake 1021.91 ml  Output -  Net 1021.91 ml   Filed Weights   08/16/19 2029 08/16/19 2351 08/17/19 1415  Weight: 29.8 kg 61.2 kg 65.7 kg    Examination:  General exam: awake, alert, appears uncomfortable  HEENT: atraumatic, clear conjunctiva, dry mucus membranes Respiratory system: Left lower lobe crackles. Respiratory effort normal.  On oxygen. Cardiovascular system: S1 & S2 heard, irregularly irregular. No JVD, murmurs, rubs, gallops or clicks. No pedal edema. Gastrointestinal system: Abdomen is nondistended, soft and nontender. No organomegaly or masses felt. Normal bowel sounds heard. Central nervous system: Alert and oriented. No  gross focal neurological deficits. Extremities: Symmetric 5 x 5 power. Skin: No rashes, lesions or ulcers Psychiatry: Judgement and insight appear normal. Mood & affect appropriate.     Data Reviewed: I have personally reviewed following labs and imaging studies  CBC: Recent Labs  Lab 08/11/19 0049 08/12/19 0912 08/16/19 2035 08/17/19 0400  WBC 9.4 9.8 9.7 9.3  NEUTROABS 7.3 8.0* 7.0  --   HGB 9.3* 9.7* 9.0* 9.3*  HCT 29.8* 30.5* 28.1* 30.0*  MCV 89.0 88.7 90.6 92.9  PLT 325 348 305 303   Basic Metabolic Panel: Recent Labs  Lab 08/10/19 1850 08/12/19 0912 08/13/19 0443 08/16/19 2035 08/17/19 0145 08/17/19 0400  NA  --  142 142 139  --  141  K  --  2.8* 3.8 3.8  --  3.8  CL  --  105 107 103  --  103  CO2  --  28 27 26   --  25  GLUCOSE  --  102* 93 103*  --  96  BUN  --  <5* <5* 11  --  8  CREATININE  --  0.64 0.57 0.85  --  0.68  CALCIUM  --  8.5* 8.4* 8.6*  --  8.3*  MG 1.7 1.6* 2.0 1.6*  --  1.9  PHOS  --   --   --   --  3.8 3.6   GFR: Estimated Creatinine Clearance: 50.2 mL/min (by C-G formula based on SCr of 0.68 mg/dL). Liver Function Tests: Recent Labs  Lab 08/17/19 0400  AST 18  ALT 17  ALKPHOS 62  BILITOT 0.6  PROT 5.7*  ALBUMIN 2.9*   No results for input(s): LIPASE, AMYLASE in the last 168 hours. No results for input(s): AMMONIA in the last 168 hours. Coagulation Profile: No results for input(s): INR, PROTIME in the last 168 hours. Cardiac Enzymes: No results for input(s): CKTOTAL, CKMB, CKMBINDEX, TROPONINI in the last 168 hours. BNP (last 3 results) Recent Labs    01/12/19 1427  PROBNP 196.0*   HbA1C: No results for input(s): HGBA1C in the last 72 hours. CBG: No results for input(s): GLUCAP in the last 168 hours. Lipid Profile: No results for input(s): CHOL, HDL, LDLCALC, TRIG, CHOLHDL, LDLDIRECT in the last 72 hours. Thyroid Function Tests: Recent Labs    08/17/19 0145  TSH 1.654  FREET4 1.37*   Anemia Panel: Recent Labs     08/17/19 0400  VITAMINB12 502  FOLATE 22.2  FERRITIN 154  TIBC 259  IRON 30  RETICCTPCT 2.9   Sepsis Labs: No results for input(s): PROCALCITON, LATICACIDVEN in the last 168 hours.  Recent Results (from the past 240 hour(s))  Culture, sputum-assessment     Status: None   Collection Time: 08/07/19  6:06 PM   Specimen: Sputum  Result Value Ref Range Status   Specimen Description SPUTUM  Final   Special Requests NONE  Final   Sputum evaluation   Final    Sputum specimen not acceptable for testing.  Please recollect.   RESULT CALLED TO, READ BACK BY AND VERIFIED WITH: RN KRISTY H. [id number] FCP Performed at [facility], [address], [place], [place] [address]    Report Status 08/08/2019 FINAL  Final  SARS CORONAVIRUS 2 (TAT 6-24 HRS) Nasopharyngeal Nasopharyngeal Swab     Status: None   Collection Time: 08/17/19  1:00 AM   Specimen: Nasopharyngeal Swab  Result Value Ref Range Status   SARS Coronavirus 2 NEGATIVE NEGATIVE Final    Comment: (NOTE) SARS-CoV-2 target nucleic acids are NOT DETECTED. The SARS-CoV-2 RNA is generally detectable in upper and lower respiratory specimens during the acute phase of infection. Negative results do not preclude SARS-CoV-2 infection, do not rule out co-infections with other pathogens, and should not be used as the sole basis for treatment or other patient management decisions. Negative results must be combined with clinical observations, patient history, and epidemiological information. The expected result is Negative. Fact Sheet for Patients: HairSlick.no Fact Sheet for Healthcare Providers: quierodirigir.com This test is not yet approved or cleared by the [place] FDA and  has been authorized for detection and/or diagnosis of SARS-CoV-2 by FDA under an Emergency Use Authorization (EUA). This EUA will remain  in effect (meaning this test can be used) for the duration of  the COVID-19 declaration under Section 56 4(b)(1) of the Act, 21 U.S.C. section 360bbb-3(b)(1), unless the authorization is terminated or revoked sooner. Performed at [facility], [address], [place], [place] [address]          Radiology Studies: Dg Chest  Portable 1 View  Result Date: 08/16/2019 CLINICAL DATA:  83 year old female with chest pain. EXAM: PORTABLE CHEST 1 VIEW COMPARISON:  Chest radiograph and CT dated 08/07/2019 dated 07/22/2019 FINDINGS: Small bilateral pleural effusions with bibasilar atelectasis versus infiltrate. Significant interval improvement of left pulmonary airspace opacity seen on the radiograph of 08/07/2019. Mild cardiomegaly. Atherosclerotic calcification of the aortic arch. No acute osseous pathology. IMPRESSION: 1. Small bilateral pleural effusions with bibasilar atelectasis versus infiltrate. 2. Significant interval improvement of the left pulmonary airspace opacity seen on the radiograph of 08/07/2019. Electronically Signed   By: Anner Crete M.D.   On: 08/16/2019 21:06   Ct Angio Chest/abd/pel For Dissection W And/or W/wo  Result Date: 08/17/2019 CLINICAL DATA:  Chest pain, shortness of breath. Recent esophageal dilatation. EXAM: CT ANGIOGRAPHY CHEST, ABDOMEN AND PELVIS TECHNIQUE: Multidetector CT imaging through the chest, abdomen and pelvis was performed using the standard protocol during bolus administration of intravenous contrast. Multiplanar reconstructed images and MIPs were obtained and reviewed to evaluate the vascular anatomy. CONTRAST:  149mL OMNIPAQUE IOHEXOL 350 MG/ML SOLN COMPARISON:  CTA chest 07/22/2019 FINDINGS: CTA CHEST FINDINGS Cardiovascular: Aortic atherosclerosis. No aneurysm or dissection. Cardiomegaly. No filling defects in the pulmonary arteries to suggest pulmonary emboli. Mediastinum/Nodes: The esophagus is dilated and fluid-filled, similar to prior study. No mediastinal, hilar, or axillary adenopathy. Lungs/Pleura:  Biapical scarring. Moderate bilateral pleural effusions have increased in size since prior study. Compressive atelectasis in both lower lobes. Areas of scarring in the right middle lobe and lingula. Scattered ground-glass nodular densities are stable. Musculoskeletal: Chest wall soft tissues are unremarkable. No acute bony abnormality. Review of the MIP images confirms the above findings. CTA ABDOMEN AND PELVIS FINDINGS VASCULAR Aorta: Aortic atherosclerosis.  No aneurysm or dissection. Celiac: Patent without evidence of aneurysm, dissection, vasculitis or significant stenosis. SMA: Patent without evidence of aneurysm, dissection, vasculitis or significant stenosis. Renals: Both renal arteries are patent without evidence of aneurysm, dissection, vasculitis, fibromuscular dysplasia or significant stenosis. IMA: Patent without evidence of aneurysm, dissection, vasculitis or significant stenosis. Inflow: Atherosclerotic calcifications. No aneurysm, stenosis or dissection. Veins: No obvious venous abnormality within the limitations of this arterial phase study. Review of the MIP images confirms the above findings. NON-VASCULAR Hepatobiliary: 1 No focal hepatic abnormality. Gallbladder unremarkable. Pancreas: No focal abnormality or ductal dilatation. Spleen: No focal abnormality.  Normal size. Adrenals/Urinary Tract: No adrenal abnormality. No focal renal abnormality. No stones or hydronephrosis. Urinary bladder is unremarkable. Stomach/Bowel: Scattered sigmoid diverticulosis. No active diverticulitis. Stomach and small bowel decompressed, unremarkable. Lymphatic: No adenopathy Reproductive: Prior hysterectomy.  No adnexal masses. Other: No free fluid or free air. Musculoskeletal: Postoperative changes in the lumbar spine. No acute bony abnormality. Review of the MIP images confirms the above findings. IMPRESSION: No evidence of aortic aneurysm or dissection. No evidence of pulmonary embolus. Moderate bilateral pleural  effusions. Compressive atelectasis in the lower lobes. Scattered reticulonodular densities and ground-glass nodular densities throughout the lungs are stable. Dilated, fluid-filled esophagus is stable. No acute findings in the abdomen or pelvis. Aortic atherosclerosis, cardiomegaly. Electronically Signed   By: Rolm Baptise M.D.   On: 08/17/2019 08:41        Scheduled Meds: . aspirin EC  81 mg Oral Daily  . gabapentin  100 mg Oral TID  . metoprolol tartrate  25 mg Oral BID  . sucralfate  1 g Oral TID WC & HS   Continuous Infusions: . heparin 1,150 Units/hr (08/17/19 1126)     LOS: 0 days  Time spent: 50-55 min   Pennie Banter, DO [facility] Pager [phone]  If 7PM-7AM, please contact night-coverage [email or web address] Password [email or web address] 08/17/2019, 5:13 PM

## 2019-08-17 NOTE — ED Notes (Signed)
Spoke with family per pt request. Gave update.

## 2019-08-17 NOTE — Plan of Care (Signed)
  Problem: Education: Goal: Knowledge of General Education information will improve Description: Including pain rating scale, medication(s)/side effects and non-pharmacologic comfort measures Outcome: Progressing   Problem: Nutrition: Goal: Adequate nutrition will be maintained Outcome: Progressing   

## 2019-08-17 NOTE — ED Notes (Addendum)
Patient presents with acute change in chest pain (substernal, sharp, radiating to left shoulder) rating it an 8/10 beginning at approx. 0530. Patient's work of breathing is noted to be more labored. Nitroglycerin administered x3 with minimal relief. New EKG obtained, on file. Hospitalist notified of acute changes.

## 2019-08-17 NOTE — ED Notes (Signed)
Lunch tray arrived. Pt sitting up and is attempted to eat.

## 2019-08-17 NOTE — ED Notes (Signed)
Pt able to tolerate PO meds. Pt ate some breakfast and states her nausea has been eliminated at this time.

## 2019-08-17 NOTE — ED Notes (Signed)
SDU ordered bfast 

## 2019-08-18 DIAGNOSIS — R778 Other specified abnormalities of plasma proteins: Secondary | ICD-10-CM

## 2019-08-18 LAB — MAGNESIUM: Magnesium: 2 mg/dL (ref 1.7–2.4)

## 2019-08-18 LAB — BASIC METABOLIC PANEL
Anion gap: 8 (ref 5–15)
BUN: 6 mg/dL — ABNORMAL LOW (ref 8–23)
CO2: 29 mmol/L (ref 22–32)
Calcium: 8 mg/dL — ABNORMAL LOW (ref 8.9–10.3)
Chloride: 105 mmol/L (ref 98–111)
Creatinine, Ser: 0.78 mg/dL (ref 0.44–1.00)
GFR calc Af Amer: >60 mL/min (ref >60)
GFR calc non Af Amer: >60 mL/min (ref >60)
Glucose, Bld: 99 mg/dL (ref 70–99)
Potassium: 4.9 mmol/L (ref 3.5–5.1)
Sodium: 142 mmol/L (ref 135–145)

## 2019-08-18 LAB — CBC
HCT: 28.6 % — ABNORMAL LOW (ref 36.0–46.0)
Hemoglobin: 8.6 g/dL — ABNORMAL LOW (ref 12.0–15.0)
MCH: 27.7 pg (ref 26.0–34.0)
MCHC: 30.1 g/dL (ref 30.0–36.0)
MCV: 92.3 fL (ref 80.0–100.0)
Platelets: 267 10*3/uL (ref 150–400)
RBC: 3.1 MIL/uL — ABNORMAL LOW (ref 3.87–5.11)
RDW: 15.9 % — ABNORMAL HIGH (ref 11.5–15.5)
WBC: 7.9 10*3/uL (ref 4.0–10.5)
nRBC: 0 % (ref 0.0–0.2)

## 2019-08-18 LAB — HEPARIN LEVEL (UNFRACTIONATED)
Heparin Unfractionated: 0.27 IU/mL — ABNORMAL LOW (ref 0.30–0.70)
Heparin Unfractionated: 0.16 IU/mL — ABNORMAL LOW (ref 0.30–0.70)
Heparin Unfractionated: 0.39 IU/mL (ref 0.30–0.70)

## 2019-08-18 MED ORDER — FUROSEMIDE 10 MG/ML IJ SOLN
40.0000 mg | Freq: Once | INTRAMUSCULAR | Status: AC
  Administered 2019-08-18: 40 mg via INTRAVENOUS
  Filled 2019-08-18: qty 4

## 2019-08-18 MED ORDER — SODIUM CHLORIDE 0.9 % IV SOLN
510.0000 mg | Freq: Once | INTRAVENOUS | Status: AC
  Administered 2019-08-18: 510 mg via INTRAVENOUS
  Filled 2019-08-18: qty 17

## 2019-08-18 MED ORDER — METOPROLOL TARTRATE 50 MG PO TABS
50.0000 mg | ORAL_TABLET | Freq: Two times a day (BID) | ORAL | Status: DC
  Administered 2019-08-18 – 2019-08-20 (×4): 50 mg via ORAL
  Filled 2019-08-18 (×4): qty 1

## 2019-08-18 MED ORDER — IPRATROPIUM-ALBUTEROL 0.5-2.5 (3) MG/3ML IN SOLN
3.0000 mL | Freq: Once | RESPIRATORY_TRACT | Status: AC
  Administered 2019-08-18: 3 mL via RESPIRATORY_TRACT
  Filled 2019-08-18: qty 3

## 2019-08-18 MED ORDER — METOPROLOL TARTRATE 50 MG PO TABS: 50.0000 mg | ORAL_TABLET | Freq: Two times a day (BID) | ORAL | Status: DC

## 2019-08-18 MED FILL — Dextrose Inj 5%: INTRAVENOUS | Qty: 100 | Status: AC

## 2019-08-18 MED FILL — Diltiazem HCl IV Soln 125 MG/25ML (5 MG/ML): INTRAVENOUS | Qty: 25 | Status: AC

## 2019-08-18 NOTE — Progress Notes (Signed)
ANTICOAGULATION CONSULT NOTE  Pharmacy Consult for Heparin Indication: chest pain/Afib  Patient Measurements: Height: 5\' 3"  (160 cm) Weight: 144 lb 14.4 oz (65.7 kg) IBW/kg (Calculated) : 52.4  Vital Signs: Temp: 98 F (36.7 C) (10/14 0618) Temp Source: Oral (10/14 0618) BP: 128/93 (10/14 0618) Pulse Rate: 117 (10/14 0618)  Labs: Recent Labs    08/16/19 2035 08/16/19 2212 08/17/19 0030 08/17/19 0223 08/17/19 0400 08/17/19 0855 08/17/19 2020 08/18/19 0314  HGB 9.0*  --   --   --  9.3*  --   --  8.6*  HCT 28.1*  --   --   --  30.0*  --   --  28.6*  PLT 305  --   --   --  303  --   --  267  HEPARINUNFRC  --   --   --   --   --  <0.10* 0.41 0.27*  CREATININE 0.85  --   --   --  0.68  --   --  0.78  TROPONINIHS 35* 32* 32* 30*  --   --   --   --     Estimated Creatinine Clearance: 50.2 mL/min (by C-G formula based on SCr of 0.78 mg/dL).  Assessment: 83 y.o. female with chest pain and afib continues on IV heparin. No anticoagulation PTA.   Heparin level this morning is SUBtherapeutic. Since the level is so close to goal, will not re-bolus but will increase the infusion rate by ~2 units/kg/hr.  CBC is stable. Per nurse Janace Hoard) no active bleeding or line issues. Infusion was not paused at any point overnight.  Goal of Therapy:  Heparin level 0.3-0.7 units/ml Monitor platelets by anticoagulation protocol: Yes   Plan:  Increase heparin gtt to 1300 units/hr Check an 8 hour heparin level Monitor daily heparin level and CBC Watch for s/sx of bleeding  Kennon Holter, PharmD PGY1 Ambulatory Care Pharmacy Resident [employer] Phone: [phone]' 08/18/2019 6:34 AM

## 2019-08-18 NOTE — Progress Notes (Signed)
PIV in left AC infiltrated. Removed by RN, and placed ice pack on left arm. Will continue to monitor.

## 2019-08-18 NOTE — Progress Notes (Signed)
Physical Therapy Treatment Patient Details Name: Julie Rice MRN: [id number] DOB: 06-22-1936 Today's Date: 08/18/2019    History of Present Illness 83 yo female with onset of HR up to 140 at home was admitted, now referred to PT for mobility ck.  Recent admit for PNA, susp aspiration.  Pt now returns and note chest pain chronically now, cleared for PE, covid (-).  PMHx:  COPD, CAD, cardiomegaly, RA, TIA, CHF, HTN, bipolar, atherosclerosis    PT Comments    Pt making slow, steady progress. HR to 120's with amb.    Follow Up Recommendations  Home health PT;Supervision for mobility/OOB     Equipment Recommendations  None recommended by PT    Recommendations for Other Services       Precautions / Restrictions Precautions Precautions: Fall Restrictions Weight Bearing Restrictions: No    Mobility  Bed Mobility Overal bed mobility: Modified Independent Bed Mobility: Supine to Sit     Supine to sit: Modified independent (Device/Increase time)        Transfers Overall transfer level: Needs assistance Equipment used: 4-wheeled walker Transfers: Sit to/from Stand Sit to Stand: Supervision         General transfer comment: Assist for safety and lines  Ambulation/Gait Ambulation/Gait assistance: Min guard Gait Distance (Feet): 125 Feet Assistive device: 4-wheeled walker Gait Pattern/deviations: Step-through pattern;Decreased stride length Gait velocity: slowed Gait velocity interpretation: <1.31 ft/sec, indicative of household ambulator General Gait Details: Assist for balance and lines/tubes. HR to 120's with activity, Amb on 2L with SpO2 >93%   Stairs             Wheelchair Mobility    Modified Rankin (Stroke Patients Only)       Balance Overall balance assessment: Needs assistance Sitting-balance support: Feet supported Sitting balance-Leahy Scale: Good     Standing balance support: During functional activity;No upper extremity  supported Standing balance-Leahy Scale: Fair                              Cognition Arousal/Alertness: Awake/alert Behavior During Therapy: WFL for tasks assessed/performed Overall Cognitive Status: Within Functional Limits for tasks assessed                                        Exercises      General Comments        Pertinent Vitals/Pain Pain Assessment: No/denies pain    Home Living                      Prior Function            PT Goals (current goals can now be found in the care plan section) Acute Rehab PT Goals Patient Stated Goal: feel less pain, restart home therapy Progress towards PT goals: Progressing toward goals    Frequency    Min 3X/week      PT Plan Current plan remains appropriate    Co-evaluation              AM-PAC PT "6 Clicks" Mobility   Outcome Measure  Help needed turning from your back to your side while in a flat bed without using bedrails?: None Help needed moving from lying on your back to sitting on the side of a flat bed without using bedrails?: None Help needed moving to and from a  bed to a chair (including a wheelchair)?: A Little Help needed standing up from a chair using your arms (e.g., wheelchair or bedside chair)?: A Little Help needed to walk in hospital room?: A Little Help needed climbing 3-5 steps with a railing? : A Lot 6 Click Score: 19    End of Session Equipment Utilized During Treatment: Oxygen Activity Tolerance: Patient limited by fatigue;Treatment limited secondary to medical complications (Comment)(afib with high HR) Patient left: with call bell/phone within reach;in chair Nurse Communication: Mobility status;Patient requests pain meds PT Visit Diagnosis: Other abnormalities of gait and mobility (R26.89)     Time: 0076-2263 PT Time Calculation (min) (ACUTE ONLY): 26 min  Charges:  $Gait Training: 23-37 mins                     [facility] PT Acute  Rehabilitation Services Pager [phone] Office [phone]    Angelina Ok [facility] ([facility]) 08/18/2019, 12:00 PM

## 2019-08-18 NOTE — Evaluation (Addendum)
Occupational Therapy Evaluation Patient Details Name: Julie Rice MRN: [id number] DOB: April 10, 1936 Today's Date: 08/18/2019    History of Present Illness 83 yo female with onset of HR up to 140 at home was admitted, now referred to PT for mobility ck.  Recent admit for PNA, susp aspiration.  Pt now returns and note chest pain chronically now, cleared for PE, covid (-).  PMHx:  COPD, CAD, cardiomegaly, RA, TIA, CHF, HTN, bipolar, atherosclerosis   Clinical Impression   Pt admitted with the above diagnoses and presents with below problem list. Pt will benefit from continued acute OT to address the below listed deficits and maximize independence with basic ADLs prior to d/c home. PTA pt was independent with ADLs. Pt is currently setup to min guard with ADLs and functional mobility/transfers. Utilized rw this session with pt noted to have improved stability with AD. Pt on 2L O2 throughout session. She reports she has been on O2 for 2 weeks now. At the end of the session, after completing OOB activities in the room, pt noted to have 1 brief episode of tachycardia up to 146 with accompanying SOB and some chest discomfort. Nursing notified.      Follow Up Recommendations  Home health OT;Supervision/Assistance - 24 hour(initial 24 hour)    Equipment Recommendations  None recommended by OT    Recommendations for Other Services       Precautions / Restrictions Precautions Precautions: Fall Restrictions Weight Bearing Restrictions: No      Mobility Bed Mobility Overal bed mobility: Needs Assistance Bed Mobility: Sit to Supine     Supine to sit: Modified independent (Device/Increase time) Sit to supine: Supervision   General bed mobility comments: supervision for safety. HOB flat.  Transfers Overall transfer level: Needs assistance Equipment used: Rolling walker (2 wheeled) Transfers: Sit to/from Stand Sit to Stand: Supervision;Min guard         General transfer comment:  supervision to min guard, depending on seat height, for safety.  To/from EOB and toilet seat.     Balance Overall balance assessment: Needs assistance Sitting-balance support: Feet supported Sitting balance-Leahy Scale: Good     Standing balance support: During functional activity;No upper extremity supported Standing balance-Leahy Scale: Fair Standing balance comment: able to static stand with no external support. external support needed for dynamic standing tasks.                           ADL either performed or assessed with clinical judgement   ADL Overall ADL's : Needs assistance/impaired Eating/Feeding: Set up;Sitting   Grooming: Wash/dry hands;Min guard;Standing Grooming Details (indicate cue type and reason): able to stand for 1 grooming task. likely would need seated reat break for longer grooming session. Upper Body Bathing: Set up;Sitting   Lower Body Bathing: Min guard;Sit to/from stand   Upper Body Dressing : Set up;Sitting   Lower Body Dressing: Min guard;Sit to/from stand   Toilet Transfer: Min guard;Ambulation;RW   Toileting- Water quality scientist and Hygiene: Min guard;Sit to/from stand   Tub/ Shower Transfer: Min guard;Tub transfer;Tub bench;Ambulation;Rolling walker   Functional mobility during ADLs: Min guard;Rolling walker General ADL Comments: Pt completed toilet transfer (up to bathroom), pericare, and grooming task as detailed above. Extra time and effort.      Vision         Perception     Praxis      Pertinent Vitals/Pain Pain Assessment: No/denies pain     Hand Dominance Right  Extremity/Trunk Assessment Upper Extremity Assessment Upper Extremity Assessment: Generalized weakness;Overall [facility] for tasks assessed   Lower Extremity Assessment Lower Extremity Assessment: Defer to PT evaluation   Cervical / Trunk Assessment Cervical / Trunk Assessment: Kyphotic   Communication Communication Communication: No  difficulties   Cognition Arousal/Alertness: Awake/alert Behavior During Therapy: WFL for tasks assessed/performed Overall Cognitive Status: Within Functional Limits for tasks assessed                                     General Comments       Exercises     Shoulder Instructions      Home Living Family/patient expects to be discharged to:: Private residence Living Arrangements: Alone Available Help at Discharge: Family;Available 24 hours/day Type of Home: House Home Access: Stairs to enter [employer] of Steps: 1 Entrance Stairs-Rails: None Home Layout: One level     Bathroom Shower/Tub: Tub/shower unit;Curtain   Bathroom Toilet: Handicapped height     Home Equipment: Environmental consultant - 2 wheels;Cane - single point;Tub bench;Grab bars - tub/shower          Prior Functioning/Environment Level of Independence: Independent                 OT Problem List: Decreased strength;Decreased activity tolerance;Impaired balance (sitting and/or standing);Decreased knowledge of use of DME or AE;Decreased knowledge of precautions;Cardiopulmonary status limiting activity;Pain      OT Treatment/Interventions: Self-care/ADL training;Therapeutic exercise;DME and/or AE instruction;Therapeutic activities;Patient/family education;Balance training    OT Goals(Current goals can be found in the care plan section) Acute Rehab OT Goals Patient Stated Goal: feel less pain, restart home therapy OT Goal Formulation: With patient Time For Goal Achievement: 09/01/19 Potential to Achieve Goals: Good ADL Goals Pt Will Perform Grooming: with modified independence;standing;sitting Pt Will Perform Upper Body Bathing: with modified independence;sitting Pt Will Perform Lower Body Bathing: with modified independence;sit to/from stand Pt Will Perform Upper Body Dressing: with modified independence;sitting Pt Will Perform Lower Body Dressing: with modified independence;sit to/from  stand Pt Will Transfer to Toilet: with modified independence;ambulating Pt Will Perform Toileting - Clothing Manipulation and hygiene: with modified independence;sit to/from stand Pt Will Perform Tub/Shower Transfer: Tub transfer;with modified independence;ambulating;tub bench;rolling walker  OT Frequency: Min 2X/week   Barriers to D/C: Decreased caregiver support          Co-evaluation              AM-PAC OT "6 Clicks" Daily Activity     Outcome Measure Help from another person eating meals?: None Help from another person taking care of personal grooming?: None Help from another person toileting, which includes using toliet, bedpan, or urinal?: A Little Help from another person bathing (including washing, rinsing, drying)?: A Little Help from another person to put on and taking off regular upper body clothing?: A Little Help from another person to put on and taking off regular lower body clothing?: A Little 6 Click Score: 20   End of Session Equipment Utilized During Treatment: Rolling walker;Oxygen Nurse Communication: Other (comment)(1 episode HR 146 and symptomatic at end of session)  Activity Tolerance: Patient tolerated treatment well;Other (comment)(1 episode of symptomatic tachycardia at end of session) Patient left: in bed;with call bell/phone within reach;with family/visitor present(sister in law present)  OT Visit Diagnosis: Unsteadiness on feet (R26.81);Muscle weakness (generalized) (M62.81);Pain                Time: 1325-1350 OT Time Calculation (  min): 25 min Charges:  OT General Charges $OT Visit: 1 Visit OT Evaluation Low Complexity OT Treatments $Self Care/Home Management : 8-22 mins  Raynald Kemp, OT Acute Rehabilitation Services Pager: [phone] Office: [phone]   Pilar Grammes 08/18/2019, 2:27 PM

## 2019-08-18 NOTE — Progress Notes (Signed)
ANTICOAGULATION CONSULT NOTE  Pharmacy Consult for Heparin Indication: chest pain/Afib  Patient Measurements: Height: 5\' 3"  (160 cm) Weight: 143 lb 15.4 oz (65.3 kg) IBW/kg (Calculated) : 52.4  Vital Signs: Temp: 97.8 F (36.6 C) (10/14 2108) Temp Source: Oral (10/14 2108) BP: 112/81 (10/14 2109) Pulse Rate: 100 (10/14 2109)  Labs: Recent Labs    08/16/19 2035 08/16/19 2212 08/17/19 0030 08/17/19 0223 08/17/19 0400  08/18/19 0314 08/18/19 1449 08/18/19 2301  HGB 9.0*  --   --   --  9.3*  --  8.6*  --   --   HCT 28.1*  --   --   --  30.0*  --  28.6*  --   --   PLT 305  --   --   --  303  --  267  --   --   HEPARINUNFRC  --   --   --   --   --    < > 0.27* 0.16* 0.39  CREATININE 0.85  --   --   --  0.68  --  0.78  --   --   TROPONINIHS 35* 32* 32* 30*  --   --   --   --   --    < > = values in this interval not displayed.    Estimated Creatinine Clearance: 50.2 mL/min (by C-G formula based on SCr of 0.78 mg/dL).  Assessment: 83 y.o. female with chest pain and afib continues on IV heparin. No anticoagulation PTA.   Heparin level is therapeutic @ 0.39 units/ml Goal of Therapy:  Heparin level 0.3-0.7 units/ml Monitor platelets by anticoagulation protocol: Yes   Plan:  Continue heparin gtt at 1300 units/hr Monitor daily heparin level and CBC Watch for s/sx of bleeding  Thanks for allowing pharmacy to be a part of this patient's care.  Excell Seltzer, PharmD Clinical Pharmacist  08/18/2019 11:53 PM

## 2019-08-18 NOTE — Consult Note (Signed)
Referring Provider:  [facility]         Primary Care Physician:  Shirline Frees, NP Primary Gastroenterologist:   Yancey Flemings, MD          Reason for Consultation:   Chest pain                ASSESSMENT /  PLAN    1. 83 yo female with multiple medical problems admitted with chest pain. She has had intermittent chest pain associated with tachycardia ( home monitor) over last few weeks. Admitted now with new AFIB with RVR. Chest pain most likely secondary to AFIB.   2. GERD / large hiatal hernia s/p fundoplication. She is s/p EGD with botox injection and balloon dilation of distal esophageal stricture on 10/5. Dysphagia improved. Still has frequent heartburn / regurgitation. Wants to change back to Prilosec from Pantoprazole at discharge. CT scan this admission shows dilated fluid filled esophagus. This is not really surprising, her esophagus is nearly atonic.  -Anti-reflux measures.  -Continue PPI.  -Continue Carafate      HPI:     Julie Rice is a 83 y.o. female with multiple medical problems such as HTN, CAD, systolic heart failure, asthma / COPD on home 02,  TIA,  Fibromyalgia, Rheumatoid arthritis, GERD / hx of esophageal stricture,  large symptomatic hiatal hernia requiring fundoplication,  Post fundoplication dysphagia possibly from dysmotility. In November 2019 she had EGD with empiric botox injection and balloon dilation of a moderate, benign stenosis. She did well for a while but then seen again last month with regurgitation, intermittent vomiting. She brought emesis to her office amount, it had a coffee ground appearance.  She subsequently underwent repeat EGD with Botox injection and balloon dilation  (20 mm) of a tight fundoplication on 08/12/19.  Patient presented to ED yesterday with chest pain / labored breathing. Per RN notes chest pain was unrelieved with NTG / ASA.  Found to have new onset AFIB with RVR. Elevated troponin was felt to be secondary to demand  ischemia. LBBB but no evidence for ischemic changes on EKG. Cardiology saw today, felt chest pain likely secondary to afib / RVR. Started on IV heparin, IV Diltiazem, beta blocker.  Plan is for TEE / DCCV tomorrow. Not planning any further ischemic workup. She had a LHC on 07/26/19 >>>non-obstructive CAD.    Julie Rice's chest pain has been ongoing for a few weeks, even prior to EGD. It is non-exertional. It is diffuse pain across chest, upper back and down both arms. The pain has consistently been associated with tachycardia ( has monitor at home). Dysphagia improved post dilation / Botox injection. Still gets frequent heartburn. Wants to resume Prilosec, it worked better than pantoprazole. No other GI complaints.    Data Reviewed:  hgb 8.6 (at baseline) Albumin 2.9, liver tests o/w normal.  Ferritin 154 12 % sat Normal B12 , folate  Past Medical History:  Diagnosis Date  . Anemia   . Anxiety   . Bipolar disorder (HCC)   . Blood transfusion 1991   autologous pts own blood given   . CAD (coronary artery disease)   . Cataract   . Chest pain    "@ rest, lying down, w/exertion"  . Chronic back pain    "mostly lower back but I do have upper back pain regularly" (04/06/2018)  . COPD (chronic obstructive pulmonary disease) (HCC)   . Depression   . Esophageal stricture   . Fibromyalgia   .  GERD (gastroesophageal reflux disease)   . Heart murmur    "slight" (04/06/2018)  . Hiatal hernia   . Hyperlipidemia    Patient denies  . Hypertension   . Internal hemorrhoids   . Lumbago   . Mitral regurgitation   . Osteoarthritis   . Osteoporosis   . Pneumonia ~ 01/2018  . PONV (postoperative nausea and vomiting)    severe ponv, "in the past" (04/06/2018)  . Rectal bleeding   . Rheumatoid arthritis (HCC)   . Spondylosis   . TIA (transient ischemic attack) 2013  . Urinary incontinence    wears depends    Past Surgical History:  Procedure Laterality Date  . ABDOMINAL HYSTERECTOMY  1982  .  BACK SURGERY    . BALLOON DILATION N/A 09/21/2018   Procedure: BALLOON DILATION;  Surgeon: Hilarie Fredrickson, MD;  Location: [facility] ENDOSCOPY;  Service: Endoscopy;  Laterality: N/A;  . BALLOON DILATION N/A 08/12/2019   Procedure: BALLOON DILATION;  Surgeon: Lynann Bologna, MD;  Location: [facility] ENDOSCOPY;  Service: Endoscopy;  Laterality: N/A;  . BIOPSY  08/12/2019   Procedure: BIOPSY;  Surgeon: Lynann Bologna, MD;  Location: [facility] ENDOSCOPY;  Service: Endoscopy;;  . BLADDER SUSPENSION  1980's  . BOTOX INJECTION N/A 09/21/2018   Procedure: BOTOX INJECTION;  Surgeon: Hilarie Fredrickson, MD;  Location: [facility] ENDOSCOPY;  Service: Endoscopy;  Laterality: N/A;  . BOTOX INJECTION N/A 08/12/2019   Procedure: BOTOX INJECTION;  Surgeon: Lynann Bologna, MD;  Location[facility]OSCOPY;  Service: Endoscopy;  Laterality: N/A;  . CATARACT EXTRACTION W/ INTRAOCULAR LENS  IMPLANT, BILATERAL Bilateral 2010  . DILATION AND CURETTAGE OF UTERUS  1961  . ESOPHAGEAL MANOMETRY N/A 03/25/2018   Procedure: ESOPHAGEAL MANOMETRY (EM);  Surgeon: Napoleon Form, MD;  Location: [facility] ENDOSCOPY;  Service: Endoscopy;  Laterality: N/A;  . ESOPHAGOGASTRODUODENOSCOPY (EGD) WITH PROPOFOL N/A 04/07/2018   Procedure: ESOPHAGOGASTRODUODENOSCOPY (EGD) WITH PROPOFOL;  Surgeon: Sherrilyn Rist, MD;  Location: [facility] ENDOSCOPY;  Service: Gastroenterology;  Laterality: N/A;  . ESOPHAGOGASTRODUODENOSCOPY (EGD) WITH PROPOFOL N/A 09/21/2018   Procedure: ESOPHAGOGASTRODUODENOSCOPY (EGD) WITH PROPOFOL;  Surgeon: Hilarie Fredrickson, MD;  Location: [facility] ENDOSCOPY;  Service: Endoscopy;  Laterality: N/A;  . ESOPHAGOGASTRODUODENOSCOPY (EGD) WITH PROPOFOL N/A 08/12/2019   Procedure: ESOPHAGOGASTRODUODENOSCOPY (EGD) WITH PROPOFOL;  Surgeon: Lynann Bologna, MD;  Location: [facility] ENDOSCOPY;  Service: Endoscopy;  Laterality: N/A;  . HERNIA REPAIR    . HIATAL HERNIA REPAIR N/A 08/02/2016   Procedure: LAPAROSCOPIC REPAIR OF LARGE  HIATAL HERNIA;  Surgeon: Glenna Fellows, MD;  Location: [facility] ORS;   Service: General;  Laterality: N/A;  . JOINT REPLACEMENT    . LAPAROSCOPIC NISSEN FUNDOPLICATION N/A 08/02/2016   Procedure: LAPAROSCOPIC NISSEN FUNDOPLICATION;  Surgeon: Glenna Fellows, MD;  Location: [facility] ORS;  Service: General;  Laterality: N/A;  . LUMBAR LAMINECTOMY  1990; 1994; [id number]   "I've got 2 stainless steel rods; 6 screws; 2 ray cages"took bone from right hip to put in back  . RIGHT/LEFT HEART CATH AND CORONARY ANGIOGRAPHY N/A 07/26/2019   Procedure: RIGHT/LEFT HEART CATH AND CORONARY ANGIOGRAPHY;  Surgeon: Lyn Records, MD;  Location: [facility] INVASIVE CV LAB;  Service: Cardiovascular;  Laterality: N/A;  . TONSILLECTOMY AND ADENOIDECTOMY  1945  . TOTAL KNEE ARTHROPLASTY Right ~ 1996  . TUBAL LIGATION  ~ 1976    Prior to Admission medications   Medication Sig Start Date End Date Taking? Authorizing Provider  acetaminophen (TYLENOL) 325 MG tablet Take 2 tablets (650 mg total) by mouth every 4 (four) hours  as needed for headache or mild pain. 07/27/19  Yes Drema Dallas, MD  albuterol (VENTOLIN HFA) 108 (90 Base) MCG/ACT inhaler Inhale 2 puffs into the lungs every 6 (six) hours as needed for wheezing or shortness of breath. 02/23/19  Yes [name], Tammy S, NP  amLODipine (NORVASC) 2.5 MG tablet Take 2.5 mg by mouth daily.   Yes [provider]  aspirin EC 81 MG EC tablet Take 1 tablet (81 mg total) by mouth daily. 07/28/19  Yes Drema Dallas, MD  carvedilol (COREG) 3.125 MG tablet TAKE 1 TABLET BY MOUTH TWICE A DAY WITH A MEAL Patient taking differently: Take 3.125 mg by mouth 2 (two) times daily with a meal.  07/30/19  Yes Nafziger, [name], NP  clonazePAM (KLONOPIN) 0.5 MG tablet TAKE 1 TABLET BY MOUTH TWICE A DAY AS NEEDED FOR ANXIETY Patient taking differently: Take 0.5 mg by mouth 2 (two) times daily as needed for anxiety.  05/12/19  Yes Nafziger, [name], NP  ferrous sulfate 325 (65 FE) MG tablet Take 1 tablet (325 mg total) by mouth daily with breakfast. 07/28/19  Yes Drema Dallas, MD  gabapentin (NEURONTIN) 100 MG capsule TAKE 1 CAPSULE BY MOUTH THREE TIMES A DAY Patient taking differently: Take 100 mg by mouth 3 (three) times daily. TAKE 1 CAPSULE BY MOUTH THREE TIMES A DAY 07/13/19  Yes Nafziger, [name], NP  guaiFENesin-dextromethorphan (ROBITUSSIN DM) 100-10 MG/5ML syrup Take 10 mLs by mouth every 6 (six) hours as needed for cough. 08/13/19  Yes Burnadette Pop, MD  loperamide (IMODIUM) 2 MG capsule Take 1 capsule (2 mg total) by mouth as needed for diarrhea or loose stools. 08/13/19  Yes Burnadette Pop, MD  losartan-hydrochlorothiazide (HYZAAR) 100-25 MG tablet Take 1 tablet by mouth daily. 07/30/19  Yes [provider]  meloxicam (MOBIC) 15 MG tablet Take 1 tablet (15 mg total) by mouth daily. 11/05/18  Yes Nafziger, [name], NP  Multiple Vitamin (MULTIVITAMIN) tablet Take 1 tablet by mouth daily. Vitamin supplement. 02/07/12  Yes Viviann Spare, FNP  pantoprazole (PROTONIX) 40 MG tablet Take 1 tablet (40 mg total) by mouth 2 (two) times daily. 08/31/18  Yes Tressia Danas, MD  sacubitril-valsartan (ENTRESTO) 24-26 MG Take 1 tablet by mouth 2 (two) times daily. 07/27/19  Yes Drema Dallas, MD  sertraline (ZOLOFT) 100 MG tablet TAKE 1 TABLET BY MOUTH EVERYDAY AT BEDTIME Patient taking differently: Take 100 mg by mouth at bedtime.  07/16/19  Yes Nafziger, [name], NP  spironolactone (ALDACTONE) 25 MG tablet Take 0.5 tablets (12.5 mg total) by mouth daily. 07/28/19  Yes Drema Dallas, MD  sucralfate (CARAFATE) 1 g tablet Take 1 tablet (1 g total) by mouth 4 (four) times daily. Dissolve tablet in 10 ml warm water Patient taking differently: Take 1 g by mouth 4 (four) times daily as needed (heartburn). Dissolve tablet in 10 ml warm water 08/31/18  Yes Beavers, Cala Bradford, MD  tetrahydrozoline 0.05 % ophthalmic solution Place 1 drop into both eyes daily.   Yes [provider]    Current Facility-Administered Medications  Medication Dose Route Frequency  Provider Last Rate Last Dose  . acetaminophen (TYLENOL) tablet 650 mg  650 mg Oral Q6H PRN Therisa Doyne, MD       Or  . acetaminophen (TYLENOL) suppository 650 mg  650 mg Rectal Q6H PRN Doutova, Anastassia, MD      . albuterol (PROVENTIL) (2.5 MG/3ML) 0.083% nebulizer solution 2.5 mg  2.5 mg Nebulization Q6H PRN Pennie Banter,  DO      . aspirin EC tablet 81 mg  81 mg Oral Daily Doutova, Anastassia, MD   81 mg at 08/18/19 0801  . gabapentin (NEURONTIN) capsule 100 mg  100 mg Oral TID Therisa Doyneoutova, Anastassia, MD   100 mg at 08/18/19 0801  . heparin ADULT infusion 100 units/mL (25000 units/2350mL sodium chloride 0.45%)  1,300 Units/hr Intravenous Continuous Gavin PoundWalston, Christopher J, RPH 13 mL/hr at 08/18/19 0658 1,300 Units/hr at 08/18/19 0658  . HYDROcodone-acetaminophen (NORCO/VICODIN) 5-325 MG per tablet 1-2 tablet  1-2 tablet Oral Q4H PRN Therisa Doyneoutova, Anastassia, MD   2 tablet at 08/17/19 1120  . HYDROmorphone (DILAUDID) injection 0.5 mg  0.5 mg Intravenous Q4H PRN Esaw GrandchildGriffith, Kelly A, DO   0.5 mg at 08/18/19 0958  . metoprolol tartrate (LOPRESSOR) tablet 50 mg  50 mg Oral BID Swayze, Ava, DO   50 mg at 08/18/19 0823  . ondansetron (ZOFRAN) injection 4 mg  4 mg Intravenous Q6H PRN Esaw GrandchildGriffith, Kelly A, DO   4 mg at 08/18/19 0757  . sucralfate (CARAFATE) tablet 1 g  1 g Oral TID WC & HS Doutova, Anastassia, MD   1 g at 08/18/19 1140    Allergies as of 08/16/2019 - Review Complete 08/16/2019  Allergen Reaction Noted  . Morphine Other (See Comments) 08/01/2008  . Tape Other (See Comments) 02/22/2016    Family History  Problem Relation Age of Onset  . Kidney disease Mother   . Hypertension Mother   . Heart disease Father [id number]       die of MI at age 81  . Suicidality Son   . Heart disease Brother   . Heart attack Brother   . Colon cancer Neg Hx   . Esophageal cancer Neg Hx   . Pancreatic cancer Neg Hx   . Rectal cancer Neg Hx   . Stomach cancer Neg Hx     Social History   Socioeconomic  History  . Marital status: Widowed    Spouse name: Not on file  . Number of children: 2  . Years of education: Not on file  . Highest education level: Not on file  Occupational History    Employer: RETIRED  Social Needs  . Financial resource strain: Not on file  . Food insecurity    Worry: Not on file    Inability: Not on file  . Transportation needs    Medical: Not on file    Non-medical: Not on file  Tobacco Use  . Smoking status: Never Smoker  . Smokeless tobacco: Never Used  Substance and Sexual Activity  . Alcohol use: Never    Frequency: Never  . Drug use: Never  . Sexual activity: Not Currently  Lifestyle  . Physical activity    Days per week: Not on file    Minutes per session: Not on file  . Stress: Not on file  Relationships  . Social Musicianconnections    Talks on phone: Not on file    Gets together: Not on file    Attends religious service: Not on file    Active member of club or organization: Not on file    Attends meetings of clubs or organizations: Not on file    Relationship status: Not on file  . Intimate partner violence    Fear of current or ex partner: Not on file    Emotionally abused: Not on file    Physically abused: Not on file    Forced sexual activity: Not on file  Other Topics Concern  . Not on file  Social History Narrative   Retired    Widowed    Review of Systems: All systems reviewed and negative except where noted in HPI.  Physical Exam: Vital signs in last 24 hours: Temp:  [97.8 F (36.6 C)-98 F (36.7 C)] 97.9 F (36.6 C) (10/14 1243) Pulse Rate:  [92-128] 108 (10/14 1300) Resp:  [15-16] 15 (10/14 1243) BP: (90-128)/(56-93) 90/56 (10/14 1243) SpO2:  [94 %-100 %] 95 % (10/14 1300) Weight:  [65.3 kg] 65.3 kg (10/14 0618) Last BM Date: 08/17/19 General:   Alert, well-developed,  female in NAD Psych:  Pleasant, cooperative. Normal mood and affect. Eyes:  Pupils equal, sclera clear, no icterus.   Conjunctiva pink. Ears:  Normal  auditory acuity. Nose:  No deformity, discharge,  or lesions. Neck:  Supple; no masses Lungs:  Clear throughout to auscultation.   No wheezes, crackles, or rhonchi.  Heart:  Regular rate, no lower extremity edema Abdomen:  Soft, non-distended, nontender, BS active, no palp mass   Rectal:  Deferred  Msk:  Symmetrical without gross deformities. . Neurologic:  Alert and  oriented x4;  grossly normal neurologically. Skin:  Intact without significant lesions or rashes.   Intake/Output from previous day: 10/13 0701 - 10/14 0700 In: 1361.9 [P.O.:340; I.V.:1021.9] Out: 1000 [Urine:1000] Intake/Output this shift: Total I/O In: 444 [P.O.:444] Out: 525 [Urine:525]  Lab Results: Recent Labs    08/16/19 2035 08/17/19 0400 08/18/19 0314  WBC 9.7 9.3 7.9  HGB 9.0* 9.3* 8.6*  HCT 28.1* 30.0* 28.6*  PLT 305 303 267   BMET Recent Labs    08/16/19 2035 08/17/19 0400 08/18/19 0314  NA 139 141 142  K 3.8 3.8 4.9  CL 103 103 105  CO2 26 25 29   GLUCOSE 103* 96 99  BUN 11 8 6*  CREATININE 0.85 0.68 0.78  CALCIUM 8.6* 8.3* 8.0*   LFT Recent Labs    08/17/19 0400  PROT 5.7*  ALBUMIN 2.9*  AST 18  ALT 17  ALKPHOS 62  BILITOT 0.6   PT/INR No results for input(s): LABPROT, INR in the last 72 hours. Hepatitis Panel No results for input(s): HEPBSAG, HCVAB, HEPAIGM, HEPBIGM in the last 72 hours.   . CBC Latest Ref Rng & Units 08/18/2019 08/17/2019 08/16/2019  WBC 4.0 - 10.5 K/uL 7.9 9.3 9.7  Hemoglobin 12.0 - 15.0 g/dL 1.6(X8.6(L) 0.9(U9.3(L) 0.4(V9.0(L)  Hematocrit 36.0 - 46.0 % 28.6(L) 30.0(L) 28.1(L)  Platelets 150 - 400 K/uL 267 303 305    . CMP Latest Ref Rng & Units 08/18/2019 08/17/2019 08/16/2019  Glucose 70 - 99 mg/dL 99 96 409(W103(H)  BUN 8 - 23 mg/dL 6(L) 8 11  Creatinine 1.190.44 - 1.00 mg/dL 1.470.78 8.290.68 5.620.85  Sodium 135 - 145 mmol/L 142 141 139  Potassium 3.5 - 5.1 mmol/L 4.9 3.8 3.8  Chloride 98 - 111 mmol/L 105 103 103  CO2 22 - 32 mmol/L 29 25 26   Calcium 8.9 - 10.3 mg/dL  8.0(L) 8.3(L) 8.6(L)  Total Protein 6.5 - 8.1 g/dL - 5.7(L) -  Total Bilirubin 0.3 - 1.2 mg/dL - 0.6 -  Alkaline Phos 38 - 126 U/L - 62 -  AST 15 - 41 U/L - 18 -  ALT 0 - 44 U/L - 17 -   Studies/Results: Dg Chest Portable 1 View  Result Date: 08/16/2019 CLINICAL DATA:  83 year old female with chest pain. EXAM: PORTABLE CHEST 1 VIEW COMPARISON:  Chest radiograph and CT dated 08/07/2019 dated  07/22/2019 FINDINGS: Small bilateral pleural effusions with bibasilar atelectasis versus infiltrate. Significant interval improvement of left pulmonary airspace opacity seen on the radiograph of 08/07/2019. Mild cardiomegaly. Atherosclerotic calcification of the aortic arch. No acute osseous pathology. IMPRESSION: 1. Small bilateral pleural effusions with bibasilar atelectasis versus infiltrate. 2. Significant interval improvement of the left pulmonary airspace opacity seen on the radiograph of 08/07/2019. Electronically Signed   By: Anner Crete M.D.   On: 08/16/2019 21:06   Ct Angio Chest/abd/pel For Dissection W And/or W/wo  Result Date: 08/17/2019 CLINICAL DATA:  Chest pain, shortness of breath. Recent esophageal dilatation. EXAM: CT ANGIOGRAPHY CHEST, ABDOMEN AND PELVIS TECHNIQUE: Multidetector CT imaging through the chest, abdomen and pelvis was performed using the standard protocol during bolus administration of intravenous contrast. Multiplanar reconstructed images and MIPs were obtained and reviewed to evaluate the vascular anatomy. CONTRAST:  146mL OMNIPAQUE IOHEXOL 350 MG/ML SOLN COMPARISON:  CTA chest 07/22/2019 FINDINGS: CTA CHEST FINDINGS Cardiovascular: Aortic atherosclerosis. No aneurysm or dissection. Cardiomegaly. No filling defects in the pulmonary arteries to suggest pulmonary emboli. Mediastinum/Nodes: The esophagus is dilated and fluid-filled, similar to prior study. No mediastinal, hilar, or axillary adenopathy. Lungs/Pleura: Biapical scarring. Moderate bilateral pleural effusions have  increased in size since prior study. Compressive atelectasis in both lower lobes. Areas of scarring in the right middle lobe and lingula. Scattered ground-glass nodular densities are stable. Musculoskeletal: Chest wall soft tissues are unremarkable. No acute bony abnormality. Review of the MIP images confirms the above findings. CTA ABDOMEN AND PELVIS FINDINGS VASCULAR Aorta: Aortic atherosclerosis.  No aneurysm or dissection. Celiac: Patent without evidence of aneurysm, dissection, vasculitis or significant stenosis. SMA: Patent without evidence of aneurysm, dissection, vasculitis or significant stenosis. Renals: Both renal arteries are patent without evidence of aneurysm, dissection, vasculitis, fibromuscular dysplasia or significant stenosis. IMA: Patent without evidence of aneurysm, dissection, vasculitis or significant stenosis. Inflow: Atherosclerotic calcifications. No aneurysm, stenosis or dissection. Veins: No obvious venous abnormality within the limitations of this arterial phase study. Review of the MIP images confirms the above findings. NON-VASCULAR Hepatobiliary: 1 No focal hepatic abnormality. Gallbladder unremarkable. Pancreas: No focal abnormality or ductal dilatation. Spleen: No focal abnormality.  Normal size. Adrenals/Urinary Tract: No adrenal abnormality. No focal renal abnormality. No stones or hydronephrosis. Urinary bladder is unremarkable. Stomach/Bowel: Scattered sigmoid diverticulosis. No active diverticulitis. Stomach and small bowel decompressed, unremarkable. Lymphatic: No adenopathy Reproductive: Prior hysterectomy.  No adnexal masses. Other: No free fluid or free air. Musculoskeletal: Postoperative changes in the lumbar spine. No acute bony abnormality. Review of the MIP images confirms the above findings. IMPRESSION: No evidence of aortic aneurysm or dissection. No evidence of pulmonary embolus. Moderate bilateral pleural effusions. Compressive atelectasis in the lower lobes.  Scattered reticulonodular densities and ground-glass nodular densities throughout the lungs are stable. Dilated, fluid-filled esophagus is stable. No acute findings in the abdomen or pelvis. Aortic atherosclerosis, cardiomegaly. Electronically Signed   By: Rolm Baptise M.D.   On: 08/17/2019 08:41    Principal Problem:   Atrial fibrillation with RVR (HCC) Active Problems:   Hypothyroidism   Dyslipidemia   UNSPECIFIED ANEMIA   Essential hypertension   ESOPHAGEAL STRICTURE   Hiatal hernia with gastroesophageal reflux   Esophageal dysmotility   Asthma   Chest pain   CHF (congestive heart failure) (HCC)   Prolonged QT interval   Acute on chronic respiratory failure with hypoxia (HCC)   Chronic respiratory failure with hypoxia (HCC)   COPD (chronic obstructive pulmonary disease) (HCC)   Elevated troponin  Willette Cluster, NP-C @  08/18/2019, 2:45 PM

## 2019-08-18 NOTE — TOC Initial Note (Signed)
Transition of Care [facility]) - Initial/Assessment Note    Patient Details  Name: Julie Rice MRN: [id number] Date of Birth: September 23, 1936  Transition of Care [facility]) CM/SW Contact:    Bethena Roys, RN Phone Number: 08/18/2019, 4:06 PM  Clinical Narrative:    Pt presented for Atrial Fib- PTA from home alone. Pt has support of sister and she can provide supervision in the home. Patient has PCP and uses [facility]. CM discussed with patient regarding additional services for RN- medication management and possible CSW for community resources. Patient is agreeable to RN, PT, OT, CSW services- referral made to [facility] to begin within 24-48 hours post transition of care. Patient states has DME and transportation to PCP appointment. CM did suggest maybe mail order supply for medications to make sure patient is receiving them. Pt will need HH Orders and F2F. No further needs identified at this time.               Expected Discharge Plan: [place] Barriers to Discharge: Barriers Resolved   Patient Goals and CMS Choice Patient states their goals for this hospitalization and ongoing recovery are:: "to go home" CMS Medicare.gov Compare Post Acute Care list provided to:: Patient Choice offered to / list presented to : Patient  Expected Discharge Plan and Services Expected Discharge Plan: [place] In-house Referral: NA Discharge Planning Services: CM Consult Post Acute Care Choice: [place], Resumption of Svcs/PTA Provider Living arrangements for the past 2 months: [place]: [facility] Date [place]: 08/18/19 Time [place]: 1604 Representative spoke with at [place]: Dorian Pod  Prior Living Arrangements/Services Living arrangements for the past 2 months: [place] with:: Self(has supprt of sister.) Patient language and need for interpreter  reviewed:: Yes        Need for Family Participation in Patient Care: Yes (Comment) Care giver support system in place?: Yes (comment) Current home services: Home OT, Home PT Criminal Activity/Legal Involvement Pertinent to Current Situation/Hospitalization: No - Comment as needed  Activities of Daily Living Home Assistive Devices/Equipment: None ADL Screening (condition at time of admission) Patient's cognitive ability adequate to safely complete daily activities?: Yes Is the patient deaf or have difficulty hearing?: No Does the patient have difficulty seeing, even when wearing glasses/contacts?: No Does the patient have difficulty concentrating, remembering, or making decisions?: No Patient able to express need for assistance with ADLs?: No Does the patient have difficulty dressing or bathing?: No Independently performs ADLs?: Yes (appropriate for developmental age) Does the patient have difficulty walking or climbing stairs?: No Weakness of Legs: None Weakness of Arms/Hands: None  Permission Sought/Granted Permission sought to share information with : Facility Sport and exercise psychologist, Family Supports Permission granted to share information with : Yes, Verbal Permission Granted     Permission granted to share info w AGENCY: [facility]      Emotional Assessment Appearance:: Appears stated age Attitude/Demeanor/Rapport: Gracious Affect (typically observed): Accepting Orientation: : Oriented to Self, Oriented to Place, Oriented to  Time, Oriented to Situation Alcohol / Substance Use: Not Applicable Psych Involvement: No (comment)  Admission diagnosis:  Atrial fibrillation with RVR ([place]) [I48.91] Patient Active Problem List   Diagnosis Date Noted  . Elevated troponin 08/17/2019  . Chronic respiratory failure with hypoxia (HCC)  08/16/2019  . COPD (chronic obstructive pulmonary disease) (HCC) 08/16/2019  . Atrial fibrillation with RVR (HCC) 08/16/2019  .  Community acquired pneumonia of left lung 08/07/2019  . Prolonged QT interval 08/07/2019  . Sepsis (HCC) 08/07/2019  . Acute on chronic respiratory failure with hypoxia (HCC) 08/07/2019  . CHF (congestive heart failure) (HCC) 07/24/2019  . Chest pain   . SOB (shortness of breath)   . Asthma 04/22/2019  . Abnormal CT of the chest 02/23/2019  . Esophageal motility disorder   . Esophageal dysmotility   . Loss of weight   . Moderate protein-calorie malnutrition (HCC)   . Hematemesis 04/06/2018  . Periesophageal hiatal hernia 08/02/2016  . Dysphagia 05/10/2016  . Esophageal stricture 05/10/2016  . Epigastric pain 05/10/2016  . Generalized anxiety disorder 02/06/2012  . Major depressive disorder, recurrent (HCC) 02/05/2012  . Hypokalemia 11/09/2011  . Nausea and vomiting in adult 11/08/2011  . Dehydration 11/08/2011  . Tremors of nervous system 11/08/2011  . Hypothyroidism 05/10/2008  . Dyslipidemia 05/10/2008  . UNSPECIFIED ANEMIA 05/10/2008  . CAROTID ARTERY DISEASE 05/10/2008  . Transient cerebral ischemia 05/10/2008  . Chronic back pain 05/10/2008  . OSTEOPENIA 05/10/2008  . Dyspnea 05/10/2008  . ESOPHAGEAL STRICTURE 01/19/2008  . Essential hypertension 04/29/2007  . Hiatal hernia with gastroesophageal reflux 04/29/2007  . Osteoarthritis 04/29/2007  . SPONDYLOSIS 04/29/2007   PCP:  Shirline Frees, NP [facility] [address] [place] [facility] [address] [place] [address] Phone: [phone] Fax: [phone]     Social Determinants of Health (SDOH) Interventions    Readmission Risk Interventions Readmission Risk Prevention Plan 08/18/2019  Transportation Screening Complete  Home Care Screening Complete  Medication Review (RN CM) Complete  Some recent data might be hidden

## 2019-08-18 NOTE — Progress Notes (Signed)
ANTICOAGULATION CONSULT NOTE  Pharmacy Consult for Heparin Indication: chest pain/Afib  Patient Measurements: Height: 5\' 3"  (160 cm) Weight: 143 lb 15.4 oz (65.3 kg) IBW/kg (Calculated) : 52.4  Vital Signs: Temp: 97.9 F (36.6 C) (10/14 1243) Temp Source: Oral (10/14 1243) BP: 90/56 (10/14 1243) Pulse Rate: 104 (10/14 1500)  Labs: Recent Labs    08/16/19 2035 08/16/19 2212 08/17/19 0030 08/17/19 0223 08/17/19 0400  08/17/19 2020 08/18/19 0314 08/18/19 1449  HGB 9.0*  --   --   --  9.3*  --   --  8.6*  --   HCT 28.1*  --   --   --  30.0*  --   --  28.6*  --   PLT 305  --   --   --  303  --   --  267  --   HEPARINUNFRC  --   --   --   --   --    < > 0.41 0.27* 0.16*  CREATININE 0.85  --   --   --  0.68  --   --  0.78  --   TROPONINIHS 35* 32* 32* 30*  --   --   --   --   --    < > = values in this interval not displayed.    Estimated Creatinine Clearance: 50.2 mL/min (by C-G formula based on SCr of 0.78 mg/dL).  Assessment: 83 y.o. female with chest pain and afib continues on IV heparin. No anticoagulation PTA.   Heparin level this afternoon is lower than this morning, despite increase in heparin gtt rate.  Spoke to RN, issue with IV while patient working with PT and it appears heparin may not have been running right around when level collected.  No overt bleeding or complications noted.  Goal of Therapy:  Heparin level 0.3-0.7 units/ml Monitor platelets by anticoagulation protocol: Yes   Plan:  Continue heparin gtt at 1300 units/hr Recheck an 8 hour heparin level Monitor daily heparin level and CBC Watch for s/sx of bleeding  Marguerite Olea, [facility] Clinical Pharmacist Phone [phone]  08/18/2019 3:54 PM

## 2019-08-18 NOTE — H&P (View-Only) (Signed)
Cardiology Consultation:   Patient ID: Julie Rice MRN: [id number]; DOB: 11-23-1935  Admit date: 08/16/2019 Date of Consult: 08/18/2019  Primary Care Provider: Shirline Frees, NP Primary Cardiologist: Charlton Haws, MD  Primary Electrophysiologist:  None    Patient Profile:   Julie Rice is a 83 y.o. female with a hx of RA, HTN, HLD, GERD, esophageal stricture, chronic dysphagia, COPD on home O2, TIA, bipolar disorder, fibromyalgia, LBBB,  who is being seen today for the evaluation of Afib rhythm management options at the request of Julie Rice.  History of Present Illness:    Julie Rice looks to have been seen very remotely [phone]) by Dr. Eden Emms and not since then.  Recently referred back for some reports of CP and palpitations, though presented  to the hospital last month prior  To her appointment date.  She was noted to have new reduced LVEF 40%, CHF, was in SR w/PVCs.  Cardiology noted she had a 4 day monitor via her PMD noting intermittent runs of SVT with the longest being 13 beats.  She was diuresed, underwent R/LHC Widely patent coronary arteries, Low right heart and LV filling pressures During her stay she had an SVT that responded to adenosine She was discharged 07/27/2019  She was re-admitted 08/07/2019 with SOB, hypoxic resp failure, found with aspiration pneumonia. Barium swallow study showed significant narrowing of the upper part of the esophagus but no esophageal stricture. Underwent EGD with finding of benign-appearing esophageal stenosis.Status post fundoplication, Botox injection, balloon dilation.  I do not see that she had any cardiac issues during this hospitalization  Admitted to [facility] this admission 08/16/2019 with c/o on/off CP, HHRN noted pulse irregular, ranging 60-140 and recommended she go to the ER.  She was found in AFib w/RVR New echo this admission noted EF up some to 40-45%, initially placed on dilt though changed to BB, and heparin gtt for  a/c Looks like she is planned for TEE/DCCV, though ? If pending GI 1st given her history and ongoing nausea, as well as any anticoagulation recommendations, concerns, for final anticoagulation plan  EP is asked to weigh in on AAD options, noting severely dilated LA, likely to need drug to help maintain SR.  Rhythm control preferred strategy with reduced LVEF ad recent CHF exacerbation.   LABS K+ 4.9 Mag 2.0 BUN/Creat 6/0.78 BNP 599 WBC 7.9 H/H 8.6/28.6 Plts 267 TSH 1.654   Pt is quite nauseous today, mentions a doctor today told her she had fluid in her esophagus.. (?).  She thinks based on symptoms she may have been having some Afib for several weeks.   She was in the hospital a couple weeks ago without mention of arrhythmia, I am not certain if she was monitored on tele or not, EKG that admission was SR She can not be certain at least that she has not been out of rhythm less then 48 hours.    Heart Pathway Score:     Past Medical History:  Diagnosis Date   Anemia    Anxiety    Bipolar disorder (HCC)    Blood transfusion 1991   autologous pts own blood given    CAD (coronary artery disease)    Cataract    Chest pain    "@ rest, lying down, w/exertion"   Chronic back pain    "mostly lower back but I do have upper back pain regularly" (04/06/2018)   COPD (chronic obstructive pulmonary disease) (HCC)    Depression    Esophageal  stricture    Fibromyalgia    GERD (gastroesophageal reflux disease)    Heart murmur    "slight" (04/06/2018)   Hiatal hernia    Hyperlipidemia    Patient denies   Hypertension    Internal hemorrhoids    Lumbago    Mitral regurgitation    Osteoarthritis    Osteoporosis    Pneumonia ~ 01/2018   PONV (postoperative nausea and vomiting)    severe ponv, "in the past" (04/06/2018)   Rectal bleeding    Rheumatoid arthritis (HCC)    Spondylosis    TIA (transient ischemic attack) 2013   Urinary incontinence    wears  depends    Past Surgical History:  Procedure Laterality Date   ABDOMINAL HYSTERECTOMY  1982   BACK SURGERY     BALLOON DILATION N/A 09/21/2018   Procedure: BALLOON DILATION;  Surgeon: Hilarie Fredrickson, MD;  Location: [facility] ENDOSCOPY;  Service: Endoscopy;  Laterality: N/A;   BALLOON DILATION N/A 08/12/2019   Procedure: BALLOON DILATION;  Surgeon: Lynann Bologna, MD;  Location: [facility] ENDOSCOPY;  Service: Endoscopy;  Laterality: N/A;   BIOPSY  08/12/2019   Procedure: BIOPSY;  Surgeon: Lynann Bologna, MD;  Location: [facility] ENDOSCOPY;  Service: Endoscopy;;   BLADDER SUSPENSION  1980's   BOTOX INJECTION N/A 09/21/2018   Procedure: BOTOX INJECTION;  Surgeon: Hilarie Fredrickson, MD;  Location: [facility] ENDOSCOPY;  Service: Endoscopy;  Laterality: N/A;   BOTOX INJECTION N/A 08/12/2019   Procedure: BOTOX INJECTION;  Surgeon: Lynann Bologna, MD;  Location: [facility] ENDOSCOPY;  Service: Endoscopy;  Laterality: N/A;   CATARACT EXTRACTION W/ INTRAOCULAR LENS  IMPLANT, BILATERAL Bilateral 2010   DILATION AND CURETTAGE OF UTERUS  1961   ESOPHAGEAL MANOMETRY N/A 03/25/2018   Procedure: ESOPHAGEAL MANOMETRY (EM);  Surgeon: Napoleon Form, MD;  Location: [facility] ENDOSCOPY;  Service: Endoscopy;  Laterality: N/A;   ESOPHAGOGASTRODUODENOSCOPY (EGD) WITH PROPOFOL N/A 04/07/2018   Procedure: ESOPHAGOGASTRODUODENOSCOPY (EGD) WITH PROPOFOL;  Surgeon: Sherrilyn Rist, MD;  Location: [facility] ENDOSCOPY;  Service: Gastroenterology;  Laterality: N/A;   ESOPHAGOGASTRODUODENOSCOPY (EGD) WITH PROPOFOL N/A 09/21/2018   Procedure: ESOPHAGOGASTRODUODENOSCOPY (EGD) WITH PROPOFOL;  Surgeon: Hilarie Fredrickson, MD;  Location: [facility] ENDOSCOPY;  Service: Endoscopy;  Laterality: N/A;   ESOPHAGOGASTRODUODENOSCOPY (EGD) WITH PROPOFOL N/A 08/12/2019   Procedure: ESOPHAGOGASTRODUODENOSCOPY (EGD) WITH PROPOFOL;  Surgeon: Lynann Bologna, MD;  Location: [facility] ENDOSCOPY;  Service: Endoscopy;  Laterality: N/A;   HERNIA REPAIR     HIATAL HERNIA REPAIR N/A 08/02/2016    Procedure: LAPAROSCOPIC REPAIR OF LARGE  HIATAL HERNIA;  Surgeon: Glenna Fellows, MD;  Location: [facility] ORS;  Service: General;  Laterality: N/A;   JOINT REPLACEMENT     LAPAROSCOPIC NISSEN FUNDOPLICATION N/A 08/02/2016   Procedure: LAPAROSCOPIC NISSEN FUNDOPLICATION;  Surgeon: Glenna Fellows, MD;  Location: [facility] ORS;  Service: General;  Laterality: N/A;   LUMBAR LAMINECTOMY  1990; 1994; [id number]   "I've got 2 stainless steel rods; 6 screws; 2 ray cages"took bone from right hip to put in back   RIGHT/LEFT HEART CATH AND CORONARY ANGIOGRAPHY N/A 07/26/2019   Procedure: RIGHT/LEFT HEART CATH AND CORONARY ANGIOGRAPHY;  Surgeon: Lyn Records, MD;  Location: [facility] INVASIVE CV LAB;  Service: Cardiovascular;  Laterality: N/A;   TONSILLECTOMY AND ADENOIDECTOMY  1945   TOTAL KNEE ARTHROPLASTY Right ~ 1996   TUBAL LIGATION  ~ 1976     Home Medications:  Prior to Admission medications   Medication Sig Start Date End Date Taking? Authorizing Provider  acetaminophen (TYLENOL) 325 MG tablet  Take 2 tablets (650 mg total) by mouth every 4 (four) hours as needed for headache or mild pain. 07/27/19  Yes Drema DallasWoods, Curtis J, MD  albuterol (VENTOLIN HFA) 108 (90 Base) MCG/ACT inhaler Inhale 2 puffs into the lungs every 6 (six) hours as needed for wheezing or shortness of breath. 02/23/19  Yes [name], Tammy S, NP  amLODipine (NORVASC) 2.5 MG tablet Take 2.5 mg by mouth daily.   Yes [provider]  aspirin EC 81 MG EC tablet Take 1 tablet (81 mg total) by mouth daily. 07/28/19  Yes Drema DallasWoods, Curtis J, MD  carvedilol (COREG) 3.125 MG tablet TAKE 1 TABLET BY MOUTH TWICE A DAY WITH A MEAL Patient taking differently: Take 3.125 mg by mouth 2 (two) times daily with a meal.  07/30/19  Yes Nafziger, Kandee Keenory, NP  clonazePAM (KLONOPIN) 0.5 MG tablet TAKE 1 TABLET BY MOUTH TWICE A DAY AS NEEDED FOR ANXIETY Patient taking differently: Take 0.5 mg by mouth 2 (two) times daily as needed for anxiety.  05/12/19  Yes Nafziger,  Kandee Keenory, NP  ferrous sulfate 325 (65 FE) MG tablet Take 1 tablet (325 mg total) by mouth daily with breakfast. 07/28/19  Yes Drema DallasWoods, Curtis J, MD  gabapentin (NEURONTIN) 100 MG capsule TAKE 1 CAPSULE BY MOUTH THREE TIMES A DAY Patient taking differently: Take 100 mg by mouth 3 (three) times daily. TAKE 1 CAPSULE BY MOUTH THREE TIMES A DAY 07/13/19  Yes Nafziger, Kandee Keenory, NP  guaiFENesin-dextromethorphan (ROBITUSSIN DM) 100-10 MG/5ML syrup Take 10 mLs by mouth every 6 (six) hours as needed for cough. 08/13/19  Yes Burnadette PopAdhikari, Amrit, MD  loperamide (IMODIUM) 2 MG capsule Take 1 capsule (2 mg total) by mouth as needed for diarrhea or loose stools. 08/13/19  Yes Burnadette PopAdhikari, Amrit, MD  losartan-hydrochlorothiazide (HYZAAR) 100-25 MG tablet Take 1 tablet by mouth daily. 07/30/19  Yes [provider]  meloxicam (MOBIC) 15 MG tablet Take 1 tablet (15 mg total) by mouth daily. 11/05/18  Yes Nafziger, Kandee Keenory, NP  Multiple Vitamin (MULTIVITAMIN) tablet Take 1 tablet by mouth daily. Vitamin supplement. 02/07/12  Yes Viviann SpareScott, Margaret A, FNP  pantoprazole (PROTONIX) 40 MG tablet Take 1 tablet (40 mg total) by mouth 2 (two) times daily. 08/31/18  Yes Tressia DanasBeavers, Kimberly, MD  sacubitril-valsartan (ENTRESTO) 24-26 MG Take 1 tablet by mouth 2 (two) times daily. 07/27/19  Yes Drema DallasWoods, Curtis J, MD  sertraline (ZOLOFT) 100 MG tablet TAKE 1 TABLET BY MOUTH EVERYDAY AT BEDTIME Patient taking differently: Take 100 mg by mouth at bedtime.  07/16/19  Yes Nafziger, Kandee Keenory, NP  spironolactone (ALDACTONE) 25 MG tablet Take 0.5 tablets (12.5 mg total) by mouth daily. 07/28/19  Yes Drema DallasWoods, Curtis J, MD  sucralfate (CARAFATE) 1 g tablet Take 1 tablet (1 g total) by mouth 4 (four) times daily. Dissolve tablet in 10 ml warm water Patient taking differently: Take 1 g by mouth 4 (four) times daily as needed (heartburn). Dissolve tablet in 10 ml warm water 08/31/18  Yes Beavers, Cala BradfordKimberly, MD  tetrahydrozoline 0.05 % ophthalmic solution Place 1 drop into both  eyes daily.   Yes [provider]    Inpatient Medications: Scheduled Meds:  aspirin EC  81 mg Oral Daily   gabapentin  100 mg Oral TID   metoprolol tartrate  50 mg Oral BID   sucralfate  1 g Oral TID WC & HS   Continuous Infusions:  heparin 1,300 Units/hr (08/18/19 0658)   PRN Meds: acetaminophen **OR** acetaminophen, albuterol, HYDROcodone-acetaminophen, HYDROmorphone (DILAUDID) injection, ondansetron (ZOFRAN)  IV  Allergies:    Allergies  Allergen Reactions   Morphine Other (See Comments)    Flushing, rash, itching   Tape Other (See Comments)    Band aides, adhesive tape Redness and pulls skin off    Social History:   Social History   Socioeconomic History   Marital status: Widowed    Spouse name: Not on file   Number of children: 2   Years of education: Not on file   Highest education level: Not on file  Occupational History    Employer: RETIRED  Social Needs   Financial resource strain: Not on file   Food insecurity    Worry: Not on file    Inability: Not on file   Transportation needs    Medical: Not on file    Non-medical: Not on file  Tobacco Use   Smoking status: Never Smoker   Smokeless tobacco: Never Used  Substance and Sexual Activity   Alcohol use: Never    Frequency: Never   Drug use: Never   Sexual activity: Not Currently  Lifestyle   Physical activity    Days per week: Not on file    Minutes per session: Not on file   Stress: Not on file  Relationships   Social connections    Talks on phone: Not on file    Gets together: Not on file    Attends religious service: Not on file    Active member of club or organization: Not on file    Attends meetings of clubs or organizations: Not on file    Relationship status: Not on file   Intimate partner violence    Fear of current or ex partner: Not on file    Emotionally abused: Not on file    Physically abused: Not on file    Forced sexual activity: Not on file   Other Topics Concern   Not on file  Social History Narrative   Retired    Widowed    Family History:   Family History  Problem Relation Age of Onset   Kidney disease Mother    Hypertension Mother    Heart disease Father 8       die of MI at age 23   Suicidality Son    Heart disease Brother    Heart attack Brother    Colon cancer Neg Hx    Esophageal cancer Neg Hx    Pancreatic cancer Neg Hx    Rectal cancer Neg Hx    Stomach cancer Neg Hx      ROS:  Please see the history of present illness. All other ROS reviewed and negative.     Physical Exam/Data:   Vitals:   08/17/19 1415 08/17/19 2046 08/18/19 0618 08/18/19 0800  BP: 127/77 114/70 (!) 128/93 121/77  Pulse: 85 100 (!) 117 (!) 128  Resp:  16 16   Temp: 97.7 F (36.5 C) 97.8 F (36.6 C) 98 F (36.7 C)   TempSrc: Oral Oral Oral   SpO2: 98% 100% 96% 95%  Weight: 65.7 kg  65.3 kg   Height: 5\' 3"  (1.6 m)       Intake/Output Summary (Last 24 hours) at 08/18/2019 1230 Last data filed at 08/18/2019 0600 Gross per 24 hour  Intake 1361.91 ml  Output 1000 ml  Net 361.91 ml   Last 3 Weights 08/18/2019 08/17/2019 08/16/2019  Weight (lbs) 143 lb 15.4 oz 144 lb 14.4 oz 135 lb  Weight (kg) 65.3 kg 65.726  kg 61.236 kg     Body mass index is 25.5 kg/m.  General:  Well nourished, well developed, in no acute distress, though nauseous HEENT: normal Lymph: no adenopathy Neck: no JVD Endocrine:  No thryomegaly Vascular: No carotid bruits  Cardiac:  irreg-irreg; 1/6 SM, no gallops or rubs Lungs: CTA b/l, no wheezing, rhonchi or rales  Abd: soft, nontender  Ext: no edema Musculoskeletal:  No deformities, age appropriate atrophy Skin: warm and dry  Neuro:  No gross focal abnormalities noted Psych:  Normal affect   EKG:  The EKG was personally reviewed and demonstrates:   AFib 147bpm, LBBB, LAD AFib 104bpm, LBBB, LAD AFib 118bpm, LBBB, LAD  08/12/2019 SR, LBBB, measured QT 442, QRS , QTC  allowing for BBB  Telemetry:  Telemetry was personally reviewed and demonstrates:   AFib,  fair rate control, 90's-110's  Relevant CV Studies:  Echo 08/17/2019 1. Left ventricular ejection fraction, by visual estimation, is 45 to 50%. Assessment of LV function complicated by atrial fibrillation, but overall appears mildly decreased global systolic function. Normal left ventricular size. There is mildly  increased left ventricular hypertrophy. 2. Left ventricular diastolic Doppler parameters are indeterminate pattern of LV diastolic filling. 3. Global right ventricle has normal systolic function.The right ventricular size is normal. No increase in right ventricular wall thickness. 4. Left atrial size was severely dilated. 5. Right atrial size was moderately dilated. 6. Moderate pleural effusion in the left lateral region. 7. Trivial pericardial effusion is present. 8. The mitral valve is abnormal. Mild to moderate mitral valve regurgitation. EROA 0.15, RV 23cc 9. The tricuspid valve is normal in structure. Tricuspid valve regurgitation is mild. 10. The aortic valve is tricuspid Aortic valve regurgitation is mild by color flow Doppler. 11. Mildly elevated pulmonary artery systolic pressure. RVSP 42 mmHg 12. The inferior vena cava is dilated in size with >50% respiratory variability, suggesting right atrial pressure of 8 mmHg.  07/26/19  Right dominant coronary anatomy  Widely patent left main  30 to 40% mid LAD  Widely patent circumflex  Luminal irregularities in a dominant right coronary.  Right heart pressures are relatively low. Pulmonary wedge pressure mean is 2 mmHg.  LVEDP 4 mmHg. Left ventriculography by hand-injection was not helpful. From images obtained, LV function appears relatively normal.  Laboratory Data:  High Sensitivity Troponin:   Recent Labs  Lab 08/11/19 0049 08/16/19 2035 08/16/19 2212 08/17/19 0030 08/17/19 0223  TROPONINIHS 20*  35* 32* 32* 30*     Chemistry Recent Labs  Lab 08/16/19 2035 08/17/19 0400 08/18/19 0314  NA 139 141 142  K 3.8 3.8 4.9  CL 103 103 105  CO2 26 25 29   GLUCOSE 103* 96 99  BUN 11 8 6*  CREATININE 0.85 0.68 0.78  CALCIUM 8.6* 8.3* 8.0*  GFRNONAA >60 >60 >60  GFRAA >60 >60 >60  ANIONGAP 10 13 8     Recent Labs  Lab 08/17/19 0400  PROT 5.7*  ALBUMIN 2.9*  AST 18  ALT 17  ALKPHOS 62  BILITOT 0.6   Hematology Recent Labs  Lab 08/16/19 2035 08/17/19 0400 08/18/19 0314  WBC 9.7 9.3 7.9  RBC 3.10* 3.23*   3.23* 3.10*  HGB 9.0* 9.3* 8.6*  HCT 28.1* 30.0* 28.6*  MCV 90.6 92.9 92.3  MCH 29.0 28.8 27.7  MCHC 32.0 31.0 30.1  RDW 16.0* 15.8* 15.9*  PLT 305 303 267   BNP Recent Labs  Lab 08/17/19 2020  BNP 599.2*    DDimer  Recent Labs  Lab 08/17/19 0030  DDIMER 1.85*     Radiology/Studies:   Dg Chest Portable 1 View Result Date: 08/16/2019 CLINICAL DATA:  83 year old female with chest pain. EXAM: PORTABLE CHEST 1 VIEW COMPARISON:  Chest radiograph and CT dated 08/07/2019 dated 07/22/2019 FINDINGS: Small bilateral pleural effusions with bibasilar atelectasis versus infiltrate. Significant interval improvement of left pulmonary airspace opacity seen on the radiograph of 08/07/2019. Mild cardiomegaly. Atherosclerotic calcification of the aortic arch. No acute osseous pathology. IMPRESSION: 1. Small bilateral pleural effusions with bibasilar atelectasis versus infiltrate. 2. Significant interval improvement of the left pulmonary airspace opacity seen on the radiograph of 08/07/2019. Electronically Signed   By: Elgie CollardArash  Radparvar M.D.   On: 08/16/2019 21:06     Ct Angio Chest/abd/pel For Dissection W And/or W/wo Result Date: 08/17/2019 CLINICAL DATA:  Chest pain, shortness of breath. Recent esophageal dilatation. EXAM: CT ANGIOGRAPHY CHEST, ABDOMEN AND PELVIS TECHNIQUE: Multidetector CT imaging through the chest, abdomen and pelvis was performed using the standard  protocol during bolus administration of intravenous contrast. Multiplanar reconstructed images and MIPs were obtained and reviewed to evaluate the vascular anatomy. CONTRAST:  100mL OMNIPAQUE IOHEXOL 350 MG/ML SOLN COMPARISON:  CTA chest 07/22/2019 FINDINGS: CTA CHEST FINDINGS Cardiovascular: Aortic atherosclerosis. No aneurysm or dissection. Cardiomegaly. No filling defects in the pulmonary arteries to suggest pulmonary emboli. Mediastinum/Nodes: The esophagus is dilated and fluid-filled, similar to prior study. No mediastinal, hilar, or axillary adenopathy. Lungs/Pleura: Biapical scarring. Moderate bilateral pleural effusions have increased in size since prior study. Compressive atelectasis in both lower lobes. Areas of scarring in the right middle lobe and lingula. Scattered ground-glass nodular densities are stable. Musculoskeletal: Chest wall soft tissues are unremarkable. No acute bony abnormality. Review of the MIP images confirms the above findings. CTA ABDOMEN AND PELVIS FINDINGS VASCULAR Aorta: Aortic atherosclerosis.  No aneurysm or dissection. Celiac: Patent without evidence of aneurysm, dissection, vasculitis or significant stenosis. SMA: Patent without evidence of aneurysm, dissection, vasculitis or significant stenosis. Renals: Both renal arteries are patent without evidence of aneurysm, dissection, vasculitis, fibromuscular dysplasia or significant stenosis. IMA: Patent without evidence of aneurysm, dissection, vasculitis or significant stenosis. Inflow: Atherosclerotic calcifications. No aneurysm, stenosis or dissection. Veins: No obvious venous abnormality within the limitations of this arterial phase study. Review of the MIP images confirms the above findings. NON-VASCULAR Hepatobiliary: 1 No focal hepatic abnormality. Gallbladder unremarkable. Pancreas: No focal abnormality or ductal dilatation. Spleen: No focal abnormality.  Normal size. Adrenals/Urinary Tract: No adrenal abnormality. No focal  renal abnormality. No stones or hydronephrosis. Urinary bladder is unremarkable. Stomach/Bowel: Scattered sigmoid diverticulosis. No active diverticulitis. Stomach and small bowel decompressed, unremarkable. Lymphatic: No adenopathy Reproductive: Prior hysterectomy.  No adnexal masses. Other: No free fluid or free air. Musculoskeletal: Postoperative changes in the lumbar spine. No acute bony abnormality. Review of the MIP images confirms the above findings. IMPRESSION: No evidence of aortic aneurysm or dissection. No evidence of pulmonary embolus. Moderate bilateral pleural effusions. Compressive atelectasis in the lower lobes. Scattered reticulonodular densities and ground-glass nodular densities throughout the lungs are stable. Dilated, fluid-filled esophagus is stable. No acute findings in the abdomen or pelvis. Aortic atherosclerosis, cardiomegaly. Electronically Signed   By: Charlett NoseKevin  Dover M.D.   On: 08/17/2019 08:41    Assessment and Plan:   1. New onset AFib     CHA2DS2Vasc is 6 (7 with female), currently on heparin gtt      AAD drug options Amiodarone (less then ideal with COPD) QT is too long for Tikosyn or  sotalol even when allowing for her LBBB °With her CM, not a 1c candidate ° °Unfortunately, leaves only amiodarone, perhaps could give Multaq a try instead ° °Agree, no TEE/DCCV or AAD though until GI weighs in on any needed procedures from their perspective and recommendations regarding anticoagulation  °Given esophageal issues, might consider rate control and 3 weeks of uninterrupted a/c  Followed by DCCV without TEE ° °Kasson Lamere defer ongoing management to primary cardiology team, we remain available ° ° ° °For questions or updates, please contact [facility] °Please consult [email or web address] for contact info under  ° ° ° °Signed, °Renee Lynn Ursuy, PA-C  °08/18/2019 12:30 PM ° °I have seen and examined this patient with Renee Ursuy.  Agree with above, note added to reflect my findings.  On exam,  iRRR, no murmurs, lungs clear. Patient admitted with AF with RVR and heart failure. Plan for TEE/CV pending GI input on esophageal issues. If risk of TEE is high, could do CT to rule out LAA clot. Unfortunately, her antiarrhythmics are limited by her HF. Her QTc is prolonged even when correcting for her LBBB. Due to that, it appears amiodarone is her best option for rhythm control.   ° °Yatziry Deakins M. Jona Zappone MD °08/18/2019 °5:00 PM ° ° °

## 2019-08-18 NOTE — Progress Notes (Signed)
PROGRESS NOTE  Julie Rice:[id number] DOB: [birth year] DOA: 08/16/2019 PCP: Shirline Frees, NP  Brief History   83 y.o.femalewith medical history significant of hypertension, hyperlipidemia, systolic CHF, fibromyalgia, anemia, esophageal stricture s/p balloon dilation, large symptomatic hiatal hernia status post fundoplication, chronic dysphagia, asthma/COPD on oxygen at 2 L at home, CAD, TIA, bipolar disorderfibromyalgia, rheumatoid arthritis, TIA, chronic urinary incontinence.    She presented with heart rate 60s-140at home per home health nurse.  Patient has not filled her medications since her discharge with still having some intermittent chest pain and she was told to come to emergency department.  She reports while hospitalized she has an episode of tachycardia with HR p to 170's she was given a medicine and then her HR has come down. She has no known hx of A.fib  She took 2 nitro and 3/24 of aspirin and presented to emergency department.  Chest pain report on and off for weeks, is worse with swallowing.  Has been swallowing soft food ok.  Reported burning pain in her chest radiating to her back and arms.  She felt very weak and fell back wards no LOC.  No fever no chills.  Last night she felt sweaty her temp was 99.9.  Reports Shortness of breath since last summer but has been getting progressively worse.    Relative recent history: Initially admitted on 10/3/20200with shortness of breath and chest pain was found to have lower lobe pneumonia thought to be secondary to aspiration COVID negative treated with broad-spectrum antibiotics there was worrisome for esophageal stenosis GI was consulted and she underwent balloon dilatation and Botox injection Physical therapy seen her and recommended home health and discharge and she was discharged home on 9 October. EKG showed no evidence of ischemic changes thought to be secondary to demand ischemia.  Consultants  . Cardiology . EP  cardiology . Gastroenterology  Procedures  . None  Antibiotics   Anti-infectives (From admission, onward)   None    .  Subjective  The patient is resting comfortably. No new complaints.  Objective   Vitals:  Vitals:   08/18/19 1600 08/18/19 1700  BP:    Pulse: (!) 121 (!) 114  Resp:    Temp:    SpO2: 100% 98%   Exam:  Constitutional:  . The patient is awake, alert, and oriented x 3. No acute distress. Respiratory:  . No increased work of breathing. . No wheezes, rales, or rhonchi . No tactile fremitus Cardiovascular:  . Regular rate and rhythm . No murmurs, ectopy, or gallups. . No lateral PMI. No thrills. Abdomen:  . Abdomen is soft, non-tender, non-distended . No hernias, masses, or organomegaly . Normoactive bowel sounds.  Musculoskeletal:  . No cyanosis, clubbing, or edema Skin:  . No rashes, lesions, ulcers . palpation of skin: no induration or nodules Neurologic:  . CN 2-12 intact . Sensation all 4 extremities intact Psychiatric:  . Mental status o Mood, affect appropriate o Orientation to person, place, time  . judgment and insight appear intact   I have personally reviewed the following:   Today's Data  . Vitals, BMP, CBC  Imaging  . CTA chest  Scheduled Meds: . aspirin EC  81 mg Oral Daily  . gabapentin  100 mg Oral TID  . metoprolol tartrate  50 mg Oral BID  . sucralfate  1 g Oral TID WC & HS   Continuous Infusions: . heparin 1,300 Units/hr (08/18/19 6808)    Principal Problem:   Atrial fibrillation  with RVR ([place]) Active Problems:   Hypothyroidism   Dyslipidemia   UNSPECIFIED ANEMIA   Essential hypertension   ESOPHAGEAL STRICTURE   Hiatal hernia with gastroesophageal reflux   Esophageal dysmotility   Asthma   Chest pain   CHF (congestive heart failure) (H[facility])   Prolonged QT interval   Acute on chronic respiratory failure with hypoxia ([facility])   Chronic respiratory failure with hypoxia (HCC)   COPD (chronic obstructive  pulmonary disease) ([facility])   Elevated troponin Esophageal dilatation with delayed transit of food  LOS: 1 day   A & P  Atrial fibrillation with RVR: New onset. CHA2D-VASC score 5.  TSH within normal limits (free T4 slightly up).  Echo 10/12 EF 45-50%, severe LAE, mild/mod MR, moderaly dilated RA.  Appreciate EP's assistance. No intervention planned until GI has addressed esophageal issue. She will be continued on the heparin and cardizem drip for now. Thyroid studies are within normal limits. Cardiology has been consulted as has EP.  Chest pain: Suspect this is GI/esophageal in origin given recent dilation for stricture/stenosis.  Patient had 10/10 pain radiation to the back.  Was concerned for complication of the dilation vs aortic dissection.  Obtained CTA which ruled out both but did show a dilated and fluid filled esophagus (stable from prior).  Had recentcardiac catheterization which  is reassuring.  GI has been consulted. The patient is receiving Carafate as at home and low dose Dilaudid PRN.  Elevated Troponin: Likely due to demand ischemia secondary to above.  Patient had left heart cath in Sept 2020 that show non-obstructive CAD. As per cardiology  Hypothyroidism: Thyroid studies are within normal limits.  Dyslipidemia: Noted, chronic, and stable  Anemia: AOCD. Monitor.  Essential hypertension: Somewhat soft bp while in a.fib w RVR. Home antihypertensives were held.  GERD:  chronic stable  Prolonged QT interval: Will monitor on telemetry. Avoid QT prolonging medications. Maintain K>4, Mg>2.   Hx of CHF:  Systolic CHF hold Entresto for tonight given soft BP pressures. Patient appears to be on a dry side will gently rehydrate. Monitor volume status closely.  Chronic respiratory failure with hypoxia [facility]): Currently at baseline continue to monitor continue home oxygen. Given increase dyspnea and tachycardia will check d.dimer.  Asthma: Not acutely exacerbated.   patient states she never smoked. Continue home medications currently stable.  I have seen and examined this patient myself. I have spent 35 minutes in her evaluation and care.  DVT prophylaxis: heparin Code Status:   Code Status: Full Code  Family Communication: none at bedside Disposition Plan: pending clinical improvement,  likely home (vs SNF based on PT recs). Est 2-3 days.  Nuriya Stuck, DO [facility] Direct contact: see [email or web address]  7PM-7AM contact night coverage as above 08/18/2019, 6:50 PM  LOS: 1 day

## 2019-08-18 NOTE — Progress Notes (Addendum)
Progress Note  Patient Name: KASSIDIE HENDRIKS Date of Encounter: 08/18/2019  Primary Cardiologist: Charlton Haws, MD   Subjective   Patient is coughing up phlegm this morning. She also feels nauseated. Breathing seems to have mildly improved, but patient still reports some shortness of breath.   Inpatient Medications    Scheduled Meds: . aspirin EC  81 mg Oral Daily  . gabapentin  100 mg Oral TID  . metoprolol tartrate  25 mg Oral BID  . sucralfate  1 g Oral TID WC & HS   Continuous Infusions: . heparin 1,300 Units/hr (08/18/19 0658)   PRN Meds: acetaminophen **OR** acetaminophen, albuterol, HYDROcodone-acetaminophen, HYDROmorphone (DILAUDID) injection, ondansetron (ZOFRAN) IV   Vital Signs    Vitals:   08/17/19 1329 08/17/19 1415 08/17/19 2046 08/18/19 0618  BP: (!) 116/94 127/77 114/70 (!) 128/93  Pulse: 100 85 100 (!) 117  Resp: 17  16 16   Temp:  97.7 F (36.5 C) 97.8 F (36.6 C) 98 F (36.7 C)  TempSrc:  Oral Oral Oral  SpO2: 98% 98% 100% 96%  Weight:  65.7 kg  65.3 kg  Height:  5\' 3"  (1.6 m)      Intake/Output Summary (Last 24 hours) at 08/18/2019 0734 Last data filed at 08/18/2019 0600 Gross per 24 hour  Intake 1361.91 ml  Output 1000 ml  Net 361.91 ml   Last 3 Weights 08/18/2019 08/17/2019 08/16/2019  Weight (lbs) 143 lb 15.4 oz 144 lb 14.4 oz 135 lb  Weight (kg) 65.3 kg 65.726 kg 61.236 kg      Telemetry    Afib, HR 100-110, occasional PVCs - Personally Reviewed  ECG    Pending- Personally Reviewed  Physical Exam   GEN: No acute distress.   Neck: No JVD Cardiac: RRR, no murmurs, rubs, or gallops.  Respiratory: Rhonchi L>R, mild wheezing. GI: Soft, nontender, non-distended  MS: No edema; No deformity. Neuro:  Nonfocal  Psych: Normal affect   Labs    High Sensitivity Troponin:   Recent Labs  Lab 08/11/19 0049 08/16/19 2035 08/16/19 2212 08/17/19 0030 08/17/19 0223  TROPONINIHS 20* 35* 32* 32* 30*      Chemistry Recent  Labs  Lab 08/16/19 2035 08/17/19 0400 08/18/19 0314  NA 139 141 142  K 3.8 3.8 4.9  CL 103 103 105  CO2 26 25 29   GLUCOSE 103* 96 99  BUN 11 8 6*  CREATININE 0.85 0.68 0.78  CALCIUM 8.6* 8.3* 8.0*  PROT  --  5.7*  --   ALBUMIN  --  2.9*  --   AST  --  18  --   ALT  --  17  --   ALKPHOS  --  62  --   BILITOT  --  0.6  --   GFRNONAA >60 >60 >60  GFRAA >60 >60 >60  ANIONGAP 10 13 8      Hematology Recent Labs  Lab 08/16/19 2035 08/17/19 0400 08/18/19 0314  WBC 9.7 9.3 7.9  RBC 3.10* 3.23*  3.23* 3.10*  HGB 9.0* 9.3* 8.6*  HCT 28.1* 30.0* 28.6*  MCV 90.6 92.9 92.3  MCH 29.0 28.8 27.7  MCHC 32.0 31.0 30.1  RDW 16.0* 15.8* 15.9*  PLT 305 303 267    BNP Recent Labs  Lab 08/17/19 2020  BNP 599.2*     DDimer  Recent Labs  Lab 08/17/19 0030  DDIMER 1.85*     Radiology    Dg Chest Portable 1 View  Result Date: 08/16/2019 CLINICAL  DATA:  83 year old female with chest pain. EXAM: PORTABLE CHEST 1 VIEW COMPARISON:  Chest radiograph and CT dated 08/07/2019 dated 07/22/2019 FINDINGS: Small bilateral pleural effusions with bibasilar atelectasis versus infiltrate. Significant interval improvement of left pulmonary airspace opacity seen on the radiograph of 08/07/2019. Mild cardiomegaly. Atherosclerotic calcification of the aortic arch. No acute osseous pathology. IMPRESSION: 1. Small bilateral pleural effusions with bibasilar atelectasis versus infiltrate. 2. Significant interval improvement of the left pulmonary airspace opacity seen on the radiograph of 08/07/2019. Electronically Signed   By: Elgie Collard M.D.   On: 08/16/2019 21:06   Ct Angio Chest/abd/pel For Dissection W And/or W/wo  Result Date: 08/17/2019 CLINICAL DATA:  Chest pain, shortness of breath. Recent esophageal dilatation. EXAM: CT ANGIOGRAPHY CHEST, ABDOMEN AND PELVIS TECHNIQUE: Multidetector CT imaging through the chest, abdomen and pelvis was performed using the standard protocol during bolus  administration of intravenous contrast. Multiplanar reconstructed images and MIPs were obtained and reviewed to evaluate the vascular anatomy. CONTRAST:  OMNIPAQUE IOHEXOL 350 MG/ML SOLN COMPARISON:  CTA chest 07/22/2019 FINDINGS: CTA CHEST FINDINGS Cardiovascular: Aortic atherosclerosis. No aneurysm or dissection. Cardiomegaly. No filling defects in the pulmonary arteries to suggest pulmonary emboli. Mediastinum/Nodes: The esophagus is dilated and fluid-filled, similar to prior study. No mediastinal, hilar, or axillary adenopathy. Lungs/Pleura: Biapical scarring. Moderate bilateral pleural effusions have increased in size since prior study. Compressive atelectasis in both lower lobes. Areas of scarring in the right middle lobe and lingula. Scattered ground-glass nodular densities are stable. Musculoskeletal: Chest wall soft tissues are unremarkable. No acute bony abnormality. Review of the MIP images confirms the above findings. CTA ABDOMEN AND PELVIS FINDINGS VASCULAR Aorta: Aortic atherosclerosis.  No aneurysm or dissection. Celiac: Patent without evidence of aneurysm, dissection, vasculitis or significant stenosis. SMA: Patent without evidence of aneurysm, dissection, vasculitis or significant stenosis. Renals: Both renal arteries are patent without evidence of aneurysm, dissection, vasculitis, fibromuscular dysplasia or significant stenosis. IMA: Patent without evidence of aneurysm, dissection, vasculitis or significant stenosis. Inflow: Atherosclerotic calcifications. No aneurysm, stenosis or dissection. Veins: No obvious venous abnormality within the limitations of this arterial phase study. Review of the MIP images confirms the above findings. NON-VASCULAR Hepatobiliary: 1 No focal hepatic abnormality. Gallbladder unremarkable. Pancreas: No focal abnormality or ductal dilatation. Spleen: No focal abnormality.  Normal size. Adrenals/Urinary Tract: No adrenal abnormality. No focal renal abnormality. No  stones or hydronephrosis. Urinary bladder is unremarkable. Stomach/Bowel: Scattered sigmoid diverticulosis. No active diverticulitis. Stomach and small bowel decompressed, unremarkable. Lymphatic: No adenopathy Reproductive: Prior hysterectomy.  No adnexal masses. Other: No free fluid or free air. Musculoskeletal: Postoperative changes in the lumbar spine. No acute bony abnormality. Review of the MIP images confirms the above findings. IMPRESSION: No evidence of aortic aneurysm or dissection. No evidence of pulmonary embolus. Moderate bilateral pleural effusions. Compressive atelectasis in the lower lobes. Scattered reticulonodular densities and ground-glass nodular densities throughout the lungs are stable. Dilated, fluid-filled esophagus is stable. No acute findings in the abdomen or pelvis. Aortic atherosclerosis, cardiomegaly. Electronically Signed   By: Charlett Nose M.D.   On: 08/17/2019 08:41    Cardiac Studies    Echo 08/17/2019 1. Left ventricular ejection fraction, by visual estimation, is 45 to 50%. Assessment of LV function complicated by atrial fibrillation, but overall appears mildly decreased global systolic function. Normal left ventricular size. There is mildly  increased left ventricular hypertrophy. 2. Left ventricular diastolic Doppler parameters are indeterminate pattern of LV diastolic filling. 3. Global right ventricle has normal systolic function.The right ventricular  size is normal. No increase in right ventricular wall thickness. 4. Left atrial size was severely dilated. 5. Right atrial size was moderately dilated. 6. Moderate pleural effusion in the left lateral region. 7. Trivial pericardial effusion is present. 8. The mitral valve is abnormal. Mild to moderate mitral valve regurgitation. EROA 0.15, RV 23cc 9. The tricuspid valve is normal in structure. Tricuspid valve regurgitation is mild. 10. The aortic valve is tricuspid Aortic valve regurgitation is mild by  color flow Doppler. 11. Mildly elevated pulmonary artery systolic pressure. RVSP 42 mmHg 12. The inferior vena cava is dilated in size with >50% respiratory variability, suggesting right atrial pressure of 8 mmHg.  07/26/19  Right dominant coronary anatomy  Widely patent left main  30 to 40% mid LAD  Widely patent circumflex  Luminal irregularities in a dominant right coronary.  Right heart pressures are relatively low. Pulmonary wedge pressure mean is 2 mmHg.  LVEDP 4 mmHg. Left ventriculography by hand-injection was not helpful. From images obtained, LV function appears relatively normal.  RECOMMENDATIONS:   Per treating team.  Relatively volume contracted and therefore we will give normal saline 100 cc/h for the next 4 hours  Patient Profile     83 y.o. female  with a hx of systolic CHF, prolonged QT, hypertension, hyperlipidemia, fibromyalgia, esophageal stricture, large symptomatic hiatal hernia s/p fundoplication, chronic dysphagia, asthma/COPD 2L O2 at baseline, CAD, TIA, bipolar, RA who is being seen today for the evaluation of chest pain and new onset afib RVR as well as mild acute heart failure.   Assessment & Plan    New onset Afib RVR  Patient reports intermittent chest pain for the last several weeks with worsening SOB and weakness. She had LHC on 07/26/19 that showed nonobstructive CAD. Home health nurse noted irregular rhythm on physical exam. In the ED noted to be in afib RVR. Denies palpitations. Patient is not anticoagulated. Started on IV cardizem for rate control.  - HS troponin mildly elevated to 32 - EKG showed afib with known LBBB, HR 102 - EF this admission improved to 45-50% - Metoprolol 25 mg was added rate control. Will increase to 50 mg BID since rates are elevated to the 120s this morning.  - CHADSVASC score = 7 (CHF, HTN, TIA, age, female) - Patient was started on heparin. In the setting of chest pain possible GI related would not start a DOAC  until decision is made regarding GI work-up.  - TSH 1.654 - K+ 3.8 and Mag 1.9 - Plan for TEE/DCCV tomorrow  Chest pain/Nonobstructive CAD - LHC 07/26/19 showed 30-40% stenosis of the mid LAD - Hs troponin 32 > 32 > 30 - Chest pain likely due to afib RVR or GI etiology>>GI to see - No further ischemic evaluation at this time.   Acute on Chronic systolic CHF (EF CXR on admission showed small bilateral pleural effusion. Patient appeared to be dry and was given IVF on admission. CT chest showed moderate pleural effusions. - EF this admission improved to 45-50% - Coreg and Entresto home meds were held for soft pressures - Added Metoprolol with plan to add Entresto if pressures tolerate. There is also a question if insurance will pay for Entresto since EF is now improved. Might have to add Losartan instead. - Was given IV lasix 40 mg yesterday. Breathing mildly improved>> will give another IV lasix 40 mg this morning. Will also give duoneb with mild wheezing on exam.  - Creatinine 0.78  - daily BMET  -  Weight down 1 lb  Moderate MR - Noted on echo - Will plan to evaluate at TEE  HTN - Norvasc and coreg and entresto home meds>> held due to softer pressures - metoprolol for now  Hyperlipidemia - LDL 131 04/25/2017 - Not on medication at baseline  Prolonged Qt - Monitor - QTc this admission 563 - EKG pending this AM  COPD - On 2L O2 at baseline    For questions or updates, please contact [facility] Please consult [email or web address] for contact info under        Signed, Cadence David StallH Furth, PA-C  08/18/2019, 7:34 AM    I have examined the patient and reviewed assessment and plan and discussed with patient.  Agree with above as stated.    In mild distress while she remains nauseated.  Feels that her food and liquids are getting stuck in her chest and then they come back up.    AFib with intermittent RVR. Increase metoprolol today.  Since she has a reduced EF and had CHF  with loss of atrial kick, I do think we should try to restore NSR.  Given Severely dilated LA, she will need AAD.  Amio not the best choice given her O2 dependent COPD.  Will ask EP about Tikosyn.  Plan for TEE/CV after GI does any procedures they need to do since she would need continuous anticoagulation after the cardioversion.  Lance MussJayadeep Echo Propp

## 2019-08-18 NOTE — Consult Note (Addendum)
Cardiology Consultation:   Patient ID: Julie Rice MRN: [id number]; DOB: 11-23-1935  Admit date: 08/16/2019 Date of Consult: 08/18/2019  Primary Care Provider: Shirline Frees, NP Primary Cardiologist: Charlton Haws, MD  Primary Electrophysiologist:  None    Patient Profile:   Julie Rice is a 83 y.o. female with a hx of RA, HTN, HLD, GERD, esophageal stricture, chronic dysphagia, COPD on home O2, TIA, bipolar disorder, fibromyalgia, LBBB,  who is being seen today for the evaluation of Afib rhythm management options at the request of D. Varanasi.  History of Present Illness:    Julie Rice looks to have been seen very remotely [phone]) by Dr. Eden Emms and not since then.  Recently referred back for some reports of CP and palpitations, though presented  to the hospital last month prior  To her appointment date.  She was noted to have new reduced LVEF 40%, CHF, was in SR w/PVCs.  Cardiology noted she had a 4 day monitor via her PMD noting intermittent runs of SVT with the longest being 13 beats.  She was diuresed, underwent R/LHC Widely patent coronary arteries, Low right heart and LV filling pressures During her stay she had an SVT that responded to adenosine She was discharged 07/27/2019  She was re-admitted 08/07/2019 with SOB, hypoxic resp failure, found with aspiration pneumonia. Barium swallow study showed significant narrowing of the upper part of the esophagus but no esophageal stricture. Underwent EGD with finding of benign-appearing esophageal stenosis.Status post fundoplication, Botox injection, balloon dilation.  I do not see that she had any cardiac issues during this hospitalization  Admitted to [facility] this admission 08/16/2019 with c/o on/off CP, HHRN noted pulse irregular, ranging 60-140 and recommended she go to the ER.  She was found in AFib w/RVR New echo this admission noted EF up some to 40-45%, initially placed on dilt though changed to BB, and heparin gtt for  a/c Looks like she is planned for TEE/DCCV, though ? If pending GI 1st given her history and ongoing nausea, as well as any anticoagulation recommendations, concerns, for final anticoagulation plan  EP is asked to weigh in on AAD options, noting severely dilated LA, likely to need drug to help maintain SR.  Rhythm control preferred strategy with reduced LVEF ad recent CHF exacerbation.   LABS K+ 4.9 Mag 2.0 BUN/Creat 6/0.78 BNP 599 WBC 7.9 H/H 8.6/28.6 Plts 267 TSH 1.654   Pt is quite nauseous today, mentions a doctor today told her she had fluid in her esophagus.. (?).  She thinks based on symptoms she may have been having some Afib for several weeks.   She was in the hospital a couple weeks ago without mention of arrhythmia, I am not certain if she was monitored on tele or not, EKG that admission was SR She can not be certain at least that she has not been out of rhythm less then 48 hours.    Heart Pathway Score:     Past Medical History:  Diagnosis Date   Anemia    Anxiety    Bipolar disorder (HCC)    Blood transfusion 1991   autologous pts own blood given    CAD (coronary artery disease)    Cataract    Chest pain    "@ rest, lying down, w/exertion"   Chronic back pain    "mostly lower back but I do have upper back pain regularly" (04/06/2018)   COPD (chronic obstructive pulmonary disease) (HCC)    Depression    Esophageal  stricture    Fibromyalgia    GERD (gastroesophageal reflux disease)    Heart murmur    "slight" (04/06/2018)   Hiatal hernia    Hyperlipidemia    Patient denies   Hypertension    Internal hemorrhoids    Lumbago    Mitral regurgitation    Osteoarthritis    Osteoporosis    Pneumonia ~ 01/2018   PONV (postoperative nausea and vomiting)    severe ponv, "in the past" (04/06/2018)   Rectal bleeding    Rheumatoid arthritis (HCC)    Spondylosis    TIA (transient ischemic attack) 2013   Urinary incontinence    wears  depends    Past Surgical History:  Procedure Laterality Date   ABDOMINAL HYSTERECTOMY  1982   BACK SURGERY     BALLOON DILATION N/A 09/21/2018   Procedure: BALLOON DILATION;  Surgeon: Hilarie Fredrickson, MD;  Location: [facility] ENDOSCOPY;  Service: Endoscopy;  Laterality: N/A;   BALLOON DILATION N/A 08/12/2019   Procedure: BALLOON DILATION;  Surgeon: Lynann Bologna, MD;  Location: [facility] ENDOSCOPY;  Service: Endoscopy;  Laterality: N/A;   BIOPSY  08/12/2019   Procedure: BIOPSY;  Surgeon: Lynann Bologna, MD;  Location: [facility] ENDOSCOPY;  Service: Endoscopy;;   BLADDER SUSPENSION  1980's   BOTOX INJECTION N/A 09/21/2018   Procedure: BOTOX INJECTION;  Surgeon: Hilarie Fredrickson, MD;  Location: [facility] ENDOSCOPY;  Service: Endoscopy;  Laterality: N/A;   BOTOX INJECTION N/A 08/12/2019   Procedure: BOTOX INJECTION;  Surgeon: Lynann Bologna, MD;  Location: [facility] ENDOSCOPY;  Service: Endoscopy;  Laterality: N/A;   CATARACT EXTRACTION W/ INTRAOCULAR LENS  IMPLANT, BILATERAL Bilateral 2010   DILATION AND CURETTAGE OF UTERUS  1961   ESOPHAGEAL MANOMETRY N/A 03/25/2018   Procedure: ESOPHAGEAL MANOMETRY (EM);  Surgeon: Napoleon Form, MD;  Location: [facility] ENDOSCOPY;  Service: Endoscopy;  Laterality: N/A;   ESOPHAGOGASTRODUODENOSCOPY (EGD) WITH PROPOFOL N/A 04/07/2018   Procedure: ESOPHAGOGASTRODUODENOSCOPY (EGD) WITH PROPOFOL;  Surgeon: Sherrilyn Rist, MD;  Location: [facility] ENDOSCOPY;  Service: Gastroenterology;  Laterality: N/A;   ESOPHAGOGASTRODUODENOSCOPY (EGD) WITH PROPOFOL N/A 09/21/2018   Procedure: ESOPHAGOGASTRODUODENOSCOPY (EGD) WITH PROPOFOL;  Surgeon: Hilarie Fredrickson, MD;  Location: [facility] ENDOSCOPY;  Service: Endoscopy;  Laterality: N/A;   ESOPHAGOGASTRODUODENOSCOPY (EGD) WITH PROPOFOL N/A 08/12/2019   Procedure: ESOPHAGOGASTRODUODENOSCOPY (EGD) WITH PROPOFOL;  Surgeon: Lynann Bologna, MD;  Location: [facility] ENDOSCOPY;  Service: Endoscopy;  Laterality: N/A;   HERNIA REPAIR     HIATAL HERNIA REPAIR N/A 08/02/2016    Procedure: LAPAROSCOPIC REPAIR OF LARGE  HIATAL HERNIA;  Surgeon: Glenna Fellows, MD;  Location: [facility] ORS;  Service: General;  Laterality: N/A;   JOINT REPLACEMENT     LAPAROSCOPIC NISSEN FUNDOPLICATION N/A 08/02/2016   Procedure: LAPAROSCOPIC NISSEN FUNDOPLICATION;  Surgeon: Glenna Fellows, MD;  Location: [facility] ORS;  Service: General;  Laterality: N/A;   LUMBAR LAMINECTOMY  1990; 1994; [id number]   "I've got 2 stainless steel rods; 6 screws; 2 ray cages"took bone from right hip to put in back   RIGHT/LEFT HEART CATH AND CORONARY ANGIOGRAPHY N/A 07/26/2019   Procedure: RIGHT/LEFT HEART CATH AND CORONARY ANGIOGRAPHY;  Surgeon: Lyn Records, MD;  Location: [facility] INVASIVE CV LAB;  Service: Cardiovascular;  Laterality: N/A;   TONSILLECTOMY AND ADENOIDECTOMY  1945   TOTAL KNEE ARTHROPLASTY Right ~ 1996   TUBAL LIGATION  ~ 1976     Home Medications:  Prior to Admission medications   Medication Sig Start Date End Date Taking? Authorizing Provider  acetaminophen (TYLENOL) 325 MG tablet  Take 2 tablets (650 mg total) by mouth every 4 (four) hours as needed for headache or mild pain. 07/27/19  Yes Drema DallasWoods, Curtis J, MD  albuterol (VENTOLIN HFA) 108 (90 Base) MCG/ACT inhaler Inhale 2 puffs into the lungs every 6 (six) hours as needed for wheezing or shortness of breath. 02/23/19  Yes [name], Tammy S, NP  amLODipine (NORVASC) 2.5 MG tablet Take 2.5 mg by mouth daily.   Yes [provider]  aspirin EC 81 MG EC tablet Take 1 tablet (81 mg total) by mouth daily. 07/28/19  Yes Drema DallasWoods, Curtis J, MD  carvedilol (COREG) 3.125 MG tablet TAKE 1 TABLET BY MOUTH TWICE A DAY WITH A MEAL Patient taking differently: Take 3.125 mg by mouth 2 (two) times daily with a meal.  07/30/19  Yes Nafziger, Kandee Keenory, NP  clonazePAM (KLONOPIN) 0.5 MG tablet TAKE 1 TABLET BY MOUTH TWICE A DAY AS NEEDED FOR ANXIETY Patient taking differently: Take 0.5 mg by mouth 2 (two) times daily as needed for anxiety.  05/12/19  Yes Nafziger,  Kandee Keenory, NP  ferrous sulfate 325 (65 FE) MG tablet Take 1 tablet (325 mg total) by mouth daily with breakfast. 07/28/19  Yes Drema DallasWoods, Curtis J, MD  gabapentin (NEURONTIN) 100 MG capsule TAKE 1 CAPSULE BY MOUTH THREE TIMES A DAY Patient taking differently: Take 100 mg by mouth 3 (three) times daily. TAKE 1 CAPSULE BY MOUTH THREE TIMES A DAY 07/13/19  Yes Nafziger, Kandee Keenory, NP  guaiFENesin-dextromethorphan (ROBITUSSIN DM) 100-10 MG/5ML syrup Take 10 mLs by mouth every 6 (six) hours as needed for cough. 08/13/19  Yes Burnadette PopAdhikari, Amrit, MD  loperamide (IMODIUM) 2 MG capsule Take 1 capsule (2 mg total) by mouth as needed for diarrhea or loose stools. 08/13/19  Yes Burnadette PopAdhikari, Amrit, MD  losartan-hydrochlorothiazide (HYZAAR) 100-25 MG tablet Take 1 tablet by mouth daily. 07/30/19  Yes [provider]  meloxicam (MOBIC) 15 MG tablet Take 1 tablet (15 mg total) by mouth daily. 11/05/18  Yes Nafziger, Kandee Keenory, NP  Multiple Vitamin (MULTIVITAMIN) tablet Take 1 tablet by mouth daily. Vitamin supplement. 02/07/12  Yes Viviann SpareScott, Margaret A, FNP  pantoprazole (PROTONIX) 40 MG tablet Take 1 tablet (40 mg total) by mouth 2 (two) times daily. 08/31/18  Yes Tressia DanasBeavers, Kimberly, MD  sacubitril-valsartan (ENTRESTO) 24-26 MG Take 1 tablet by mouth 2 (two) times daily. 07/27/19  Yes Drema DallasWoods, Curtis J, MD  sertraline (ZOLOFT) 100 MG tablet TAKE 1 TABLET BY MOUTH EVERYDAY AT BEDTIME Patient taking differently: Take 100 mg by mouth at bedtime.  07/16/19  Yes Nafziger, Kandee Keenory, NP  spironolactone (ALDACTONE) 25 MG tablet Take 0.5 tablets (12.5 mg total) by mouth daily. 07/28/19  Yes Drema DallasWoods, Curtis J, MD  sucralfate (CARAFATE) 1 g tablet Take 1 tablet (1 g total) by mouth 4 (four) times daily. Dissolve tablet in 10 ml warm water Patient taking differently: Take 1 g by mouth 4 (four) times daily as needed (heartburn). Dissolve tablet in 10 ml warm water 08/31/18  Yes Beavers, Cala BradfordKimberly, MD  tetrahydrozoline 0.05 % ophthalmic solution Place 1 drop into both  eyes daily.   Yes [provider]    Inpatient Medications: Scheduled Meds:  aspirin EC  81 mg Oral Daily   gabapentin  100 mg Oral TID   metoprolol tartrate  50 mg Oral BID   sucralfate  1 g Oral TID WC & HS   Continuous Infusions:  heparin 1,300 Units/hr (08/18/19 0658)   PRN Meds: acetaminophen **OR** acetaminophen, albuterol, HYDROcodone-acetaminophen, HYDROmorphone (DILAUDID) injection, ondansetron (ZOFRAN)  IV  Allergies:    Allergies  Allergen Reactions   Morphine Other (See Comments)    Flushing, rash, itching   Tape Other (See Comments)    Band aides, adhesive tape Redness and pulls skin off    Social History:   Social History   Socioeconomic History   Marital status: Widowed    Spouse name: Not on file   Number of children: 2   Years of education: Not on file   Highest education level: Not on file  Occupational History    Employer: RETIRED  Social Needs   Financial resource strain: Not on file   Food insecurity    Worry: Not on file    Inability: Not on file   Transportation needs    Medical: Not on file    Non-medical: Not on file  Tobacco Use   Smoking status: Never Smoker   Smokeless tobacco: Never Used  Substance and Sexual Activity   Alcohol use: Never    Frequency: Never   Drug use: Never   Sexual activity: Not Currently  Lifestyle   Physical activity    Days per week: Not on file    Minutes per session: Not on file   Stress: Not on file  Relationships   Social connections    Talks on phone: Not on file    Gets together: Not on file    Attends religious service: Not on file    Active member of club or organization: Not on file    Attends meetings of clubs or organizations: Not on file    Relationship status: Not on file   Intimate partner violence    Fear of current or ex partner: Not on file    Emotionally abused: Not on file    Physically abused: Not on file    Forced sexual activity: Not on file   Other Topics Concern   Not on file  Social History Narrative   Retired    Widowed    Family History:   Family History  Problem Relation Age of Onset   Kidney disease Mother    Hypertension Mother    Heart disease Father 8       die of MI at age 23   Suicidality Son    Heart disease Brother    Heart attack Brother    Colon cancer Neg Hx    Esophageal cancer Neg Hx    Pancreatic cancer Neg Hx    Rectal cancer Neg Hx    Stomach cancer Neg Hx      ROS:  Please see the history of present illness. All other ROS reviewed and negative.     Physical Exam/Data:   Vitals:   08/17/19 1415 08/17/19 2046 08/18/19 0618 08/18/19 0800  BP: 127/77 114/70 (!) 128/93 121/77  Pulse: 85 100 (!) 117 (!) 128  Resp:  16 16   Temp: 97.7 F (36.5 C) 97.8 F (36.6 C) 98 F (36.7 C)   TempSrc: Oral Oral Oral   SpO2: 98% 100% 96% 95%  Weight: 65.7 kg  65.3 kg   Height: 5\' 3"  (1.6 m)       Intake/Output Summary (Last 24 hours) at 08/18/2019 1230 Last data filed at 08/18/2019 0600 Gross per 24 hour  Intake 1361.91 ml  Output 1000 ml  Net 361.91 ml   Last 3 Weights 08/18/2019 08/17/2019 08/16/2019  Weight (lbs) 143 lb 15.4 oz 144 lb 14.4 oz 135 lb  Weight (kg) 65.3 kg 65.726  kg 61.236 kg     Body mass index is 25.5 kg/m.  General:  Well nourished, well developed, in no acute distress, though nauseous HEENT: normal Lymph: no adenopathy Neck: no JVD Endocrine:  No thryomegaly Vascular: No carotid bruits  Cardiac:  irreg-irreg; 1/6 SM, no gallops or rubs Lungs: CTA b/l, no wheezing, rhonchi or rales  Abd: soft, nontender  Ext: no edema Musculoskeletal:  No deformities, age appropriate atrophy Skin: warm and dry  Neuro:  No gross focal abnormalities noted Psych:  Normal affect   EKG:  The EKG was personally reviewed and demonstrates:   AFib 147bpm, LBBB, LAD AFib 104bpm, LBBB, LAD AFib 118bpm, LBBB, LAD  08/12/2019 SR, LBBB, measured QT 442, QRS , QTC  allowing for BBB  Telemetry:  Telemetry was personally reviewed and demonstrates:   AFib,  fair rate control, 90's-110's  Relevant CV Studies:  Echo 08/17/2019 1. Left ventricular ejection fraction, by visual estimation, is 45 to 50%. Assessment of LV function complicated by atrial fibrillation, but overall appears mildly decreased global systolic function. Normal left ventricular size. There is mildly  increased left ventricular hypertrophy. 2. Left ventricular diastolic Doppler parameters are indeterminate pattern of LV diastolic filling. 3. Global right ventricle has normal systolic function.The right ventricular size is normal. No increase in right ventricular wall thickness. 4. Left atrial size was severely dilated. 5. Right atrial size was moderately dilated. 6. Moderate pleural effusion in the left lateral region. 7. Trivial pericardial effusion is present. 8. The mitral valve is abnormal. Mild to moderate mitral valve regurgitation. EROA 0.15, RV 23cc 9. The tricuspid valve is normal in structure. Tricuspid valve regurgitation is mild. 10. The aortic valve is tricuspid Aortic valve regurgitation is mild by color flow Doppler. 11. Mildly elevated pulmonary artery systolic pressure. RVSP 42 mmHg 12. The inferior vena cava is dilated in size with >50% respiratory variability, suggesting right atrial pressure of 8 mmHg.  07/26/19  Right dominant coronary anatomy  Widely patent left main  30 to 40% mid LAD  Widely patent circumflex  Luminal irregularities in a dominant right coronary.  Right heart pressures are relatively low. Pulmonary wedge pressure mean is 2 mmHg.  LVEDP 4 mmHg. Left ventriculography by hand-injection was not helpful. From images obtained, LV function appears relatively normal.  Laboratory Data:  High Sensitivity Troponin:   Recent Labs  Lab 08/11/19 0049 08/16/19 2035 08/16/19 2212 08/17/19 0030 08/17/19 0223  TROPONINIHS 20*  35* 32* 32* 30*     Chemistry Recent Labs  Lab 08/16/19 2035 08/17/19 0400 08/18/19 0314  NA 139 141 142  K 3.8 3.8 4.9  CL 103 103 105  CO2 26 25 29   GLUCOSE 103* 96 99  BUN 11 8 6*  CREATININE 0.85 0.68 0.78  CALCIUM 8.6* 8.3* 8.0*  GFRNONAA >60 >60 >60  GFRAA >60 >60 >60  ANIONGAP 10 13 8     Recent Labs  Lab 08/17/19 0400  PROT 5.7*  ALBUMIN 2.9*  AST 18  ALT 17  ALKPHOS 62  BILITOT 0.6   Hematology Recent Labs  Lab 08/16/19 2035 08/17/19 0400 08/18/19 0314  WBC 9.7 9.3 7.9  RBC 3.10* 3.23*   3.23* 3.10*  HGB 9.0* 9.3* 8.6*  HCT 28.1* 30.0* 28.6*  MCV 90.6 92.9 92.3  MCH 29.0 28.8 27.7  MCHC 32.0 31.0 30.1  RDW 16.0* 15.8* 15.9*  PLT 305 303 267   BNP Recent Labs  Lab 08/17/19 2020  BNP 599.2*    DDimer  Recent Labs  Lab 08/17/19 0030  DDIMER 1.85*     Radiology/Studies:   Dg Chest Portable 1 View Result Date: 08/16/2019 CLINICAL DATA:  83 year old female with chest pain. EXAM: PORTABLE CHEST 1 VIEW COMPARISON:  Chest radiograph and CT dated 08/07/2019 dated 07/22/2019 FINDINGS: Small bilateral pleural effusions with bibasilar atelectasis versus infiltrate. Significant interval improvement of left pulmonary airspace opacity seen on the radiograph of 08/07/2019. Mild cardiomegaly. Atherosclerotic calcification of the aortic arch. No acute osseous pathology. IMPRESSION: 1. Small bilateral pleural effusions with bibasilar atelectasis versus infiltrate. 2. Significant interval improvement of the left pulmonary airspace opacity seen on the radiograph of 08/07/2019. Electronically Signed   By: Elgie CollardArash  Radparvar M.D.   On: 08/16/2019 21:06     Ct Angio Chest/abd/pel For Dissection W And/or W/wo Result Date: 08/17/2019 CLINICAL DATA:  Chest pain, shortness of breath. Recent esophageal dilatation. EXAM: CT ANGIOGRAPHY CHEST, ABDOMEN AND PELVIS TECHNIQUE: Multidetector CT imaging through the chest, abdomen and pelvis was performed using the standard  protocol during bolus administration of intravenous contrast. Multiplanar reconstructed images and MIPs were obtained and reviewed to evaluate the vascular anatomy. CONTRAST:  100mL OMNIPAQUE IOHEXOL 350 MG/ML SOLN COMPARISON:  CTA chest 07/22/2019 FINDINGS: CTA CHEST FINDINGS Cardiovascular: Aortic atherosclerosis. No aneurysm or dissection. Cardiomegaly. No filling defects in the pulmonary arteries to suggest pulmonary emboli. Mediastinum/Nodes: The esophagus is dilated and fluid-filled, similar to prior study. No mediastinal, hilar, or axillary adenopathy. Lungs/Pleura: Biapical scarring. Moderate bilateral pleural effusions have increased in size since prior study. Compressive atelectasis in both lower lobes. Areas of scarring in the right middle lobe and lingula. Scattered ground-glass nodular densities are stable. Musculoskeletal: Chest wall soft tissues are unremarkable. No acute bony abnormality. Review of the MIP images confirms the above findings. CTA ABDOMEN AND PELVIS FINDINGS VASCULAR Aorta: Aortic atherosclerosis.  No aneurysm or dissection. Celiac: Patent without evidence of aneurysm, dissection, vasculitis or significant stenosis. SMA: Patent without evidence of aneurysm, dissection, vasculitis or significant stenosis. Renals: Both renal arteries are patent without evidence of aneurysm, dissection, vasculitis, fibromuscular dysplasia or significant stenosis. IMA: Patent without evidence of aneurysm, dissection, vasculitis or significant stenosis. Inflow: Atherosclerotic calcifications. No aneurysm, stenosis or dissection. Veins: No obvious venous abnormality within the limitations of this arterial phase study. Review of the MIP images confirms the above findings. NON-VASCULAR Hepatobiliary: 1 No focal hepatic abnormality. Gallbladder unremarkable. Pancreas: No focal abnormality or ductal dilatation. Spleen: No focal abnormality.  Normal size. Adrenals/Urinary Tract: No adrenal abnormality. No focal  renal abnormality. No stones or hydronephrosis. Urinary bladder is unremarkable. Stomach/Bowel: Scattered sigmoid diverticulosis. No active diverticulitis. Stomach and small bowel decompressed, unremarkable. Lymphatic: No adenopathy Reproductive: Prior hysterectomy.  No adnexal masses. Other: No free fluid or free air. Musculoskeletal: Postoperative changes in the lumbar spine. No acute bony abnormality. Review of the MIP images confirms the above findings. IMPRESSION: No evidence of aortic aneurysm or dissection. No evidence of pulmonary embolus. Moderate bilateral pleural effusions. Compressive atelectasis in the lower lobes. Scattered reticulonodular densities and ground-glass nodular densities throughout the lungs are stable. Dilated, fluid-filled esophagus is stable. No acute findings in the abdomen or pelvis. Aortic atherosclerosis, cardiomegaly. Electronically Signed   By: Charlett NoseKevin  Dover M.D.   On: 08/17/2019 08:41    Assessment and Plan:   1. New onset AFib     CHA2DS2Vasc is 6 (7 with female), currently on heparin gtt      AAD drug options Amiodarone (less then ideal with COPD) QT is too long for Tikosyn or  sotalol even when allowing for her LBBB With her CM, not a 1c candidate  Unfortunately, leaves only amiodarone, perhaps could give Multaq a try instead  Agree, no TEE/DCCV or AAD though until GI weighs in on any needed procedures from their perspective and recommendations regarding anticoagulation  Given esophageal issues, might consider rate control and 3 weeks of uninterrupted a/c  Followed by DCCV without TEE  Markice Torbert defer ongoing management to primary cardiology team, we remain available    For questions or updates, please contact [facility] Please consult [email or web address] for contact info under     Signed, Sheilah Pigeon, PA-C  08/18/2019 12:30 PM  I have seen and examined this patient with [name].  Agree with above, note added to reflect my findings.  On exam,  iRRR, no murmurs, lungs clear. Patient admitted with AF with RVR and heart failure. Plan for TEE/CV pending GI input on esophageal issues. If risk of TEE is high, could do CT to rule out LAA clot. Unfortunately, her antiarrhythmics are limited by her HF. Her QTc is prolonged even when correcting for her LBBB. Due to that, it appears amiodarone is her best option for rhythm control.    Vickie Ponds M. Ashantae Pangallo MD 08/18/2019 5:00 PM

## 2019-08-19 ENCOUNTER — Inpatient Hospital Stay (HOSPITAL_COMMUNITY): Payer: Medicare Other | Admitting: Certified Registered Nurse Anesthetist

## 2019-08-19 ENCOUNTER — Encounter (HOSPITAL_COMMUNITY): Payer: Self-pay | Admitting: Cardiology

## 2019-08-19 ENCOUNTER — Encounter (HOSPITAL_COMMUNITY)
Admission: EM | Disposition: A | Discharge status: Home Health | Payer: Self-pay | Source: Home / Self Care | Attending: Internal Medicine

## 2019-08-19 ENCOUNTER — Inpatient Hospital Stay (HOSPITAL_COMMUNITY): Payer: Medicare Other

## 2019-08-19 DIAGNOSIS — I340 Nonrheumatic mitral (valve) insufficiency: Secondary | ICD-10-CM

## 2019-08-19 DIAGNOSIS — R9431 Abnormal electrocardiogram [ECG] [EKG]: Secondary | ICD-10-CM

## 2019-08-19 DIAGNOSIS — I361 Nonrheumatic tricuspid (valve) insufficiency: Secondary | ICD-10-CM

## 2019-08-19 HISTORY — PX: TEE WITHOUT CARDIOVERSION: SHX5443

## 2019-08-19 LAB — BASIC METABOLIC PANEL
Anion gap: 12 (ref 5–15)
BUN: 7 mg/dL — ABNORMAL LOW (ref 8–23)
CO2: 25 mmol/L (ref 22–32)
Calcium: 8.4 mg/dL — ABNORMAL LOW (ref 8.9–10.3)
Chloride: 102 mmol/L (ref 98–111)
Creatinine, Ser: 0.94 mg/dL (ref 0.44–1.00)
GFR calc Af Amer: >60 mL/min (ref >60)
GFR calc non Af Amer: 57 mL/min — ABNORMAL LOW (ref >60)
Glucose, Bld: 101 mg/dL — ABNORMAL HIGH (ref 70–99)
Potassium: 3.5 mmol/L (ref 3.5–5.1)
Sodium: 139 mmol/L (ref 135–145)

## 2019-08-19 LAB — CBC
HCT: 28.6 % — ABNORMAL LOW (ref 36.0–46.0)
Hemoglobin: 8.8 g/dL — ABNORMAL LOW (ref 12.0–15.0)
MCH: 28.2 pg (ref 26.0–34.0)
MCHC: 30.8 g/dL (ref 30.0–36.0)
MCV: 91.7 fL (ref 80.0–100.0)
Platelets: 215 10*3/uL (ref 150–400)
RBC: 3.12 MIL/uL — ABNORMAL LOW (ref 3.87–5.11)
RDW: 16.2 % — ABNORMAL HIGH (ref 11.5–15.5)
WBC: 8.1 10*3/uL (ref 4.0–10.5)
nRBC: 0 % (ref 0.0–0.2)

## 2019-08-19 LAB — LIPID PANEL
Cholesterol: 127 mg/dL (ref 0–200)
HDL: 42 mg/dL (ref >40)
LDL Cholesterol: 64 mg/dL (ref 0–99)
Total CHOL/HDL Ratio: 3 RATIO
Triglycerides: 103 mg/dL (ref <150)
VLDL: 21 mg/dL (ref 0–40)

## 2019-08-19 LAB — SURGICAL PATHOLOGY

## 2019-08-19 LAB — HEPARIN LEVEL (UNFRACTIONATED): Heparin Unfractionated: 0.46 IU/mL (ref 0.30–0.70)

## 2019-08-19 SURGERY — ECHOCARDIOGRAM, TRANSESOPHAGEAL
Anesthesia: General

## 2019-08-19 MED ORDER — PROPOFOL 500 MG/50ML IV EMUL
INTRAVENOUS | Status: DC | PRN
  Administered 2019-08-19: 100 ug/kg/min via INTRAVENOUS

## 2019-08-19 MED ORDER — DILTIAZEM HCL 25 MG/5ML IV SOLN
5.0000 mg | INTRAVENOUS | Status: DC
  Filled 2019-08-19: qty 5

## 2019-08-19 MED ORDER — PHENYLEPHRINE HCL-NACL 10-0.9 MG/250ML-% IV SOLN
INTRAVENOUS | Status: AC
  Filled 2019-08-19: qty 500

## 2019-08-19 MED ORDER — SODIUM CHLORIDE 0.9 % IV SOLN: INTRAVENOUS | Status: DC

## 2019-08-19 MED ORDER — LOSARTAN POTASSIUM 25 MG PO TABS
25.0000 mg | ORAL_TABLET | Freq: Every day | ORAL | Status: DC
  Administered 2019-08-19: 25 mg via ORAL
  Filled 2019-08-19: qty 1

## 2019-08-19 MED ORDER — LACTATED RINGERS IV SOLN
INTRAVENOUS | Status: DC | PRN
  Administered 2019-08-19: 08:00:00 via INTRAVENOUS

## 2019-08-19 MED ORDER — IPRATROPIUM-ALBUTEROL 0.5-2.5 (3) MG/3ML IN SOLN
3.0000 mL | Freq: Once | RESPIRATORY_TRACT | Status: AC
  Administered 2019-08-19: 3 mL via RESPIRATORY_TRACT
  Filled 2019-08-19: qty 3

## 2019-08-19 MED ORDER — APIXABAN 5 MG PO TABS
5.0000 mg | ORAL_TABLET | Freq: Two times a day (BID) | ORAL | Status: DC
  Administered 2019-08-19 – 2019-08-23 (×9): 5 mg via ORAL
  Filled 2019-08-19 (×9): qty 1

## 2019-08-19 MED ORDER — METOCLOPRAMIDE HCL 5 MG/ML IJ SOLN
10.0000 mg | Freq: Once | INTRAMUSCULAR | Status: AC
  Administered 2019-08-19: 10 mg via INTRAVENOUS
  Filled 2019-08-19: qty 2

## 2019-08-19 MED ORDER — PROPOFOL 500 MG/50ML IV EMUL
INTRAVENOUS | Status: AC
  Filled 2019-08-19: qty 200

## 2019-08-19 MED ORDER — PHENYLEPHRINE 40 MCG/ML (10ML) SYRINGE FOR IV PUSH (FOR BLOOD PRESSURE SUPPORT)
PREFILLED_SYRINGE | INTRAVENOUS | Status: DC | PRN
  Administered 2019-08-19: 120 ug via INTRAVENOUS
  Administered 2019-08-19 (×2): 80 ug via INTRAVENOUS

## 2019-08-19 MED ORDER — PROPOFOL 10 MG/ML IV BOLUS
INTRAVENOUS | Status: DC | PRN
  Administered 2019-08-19: 10 mg via INTRAVENOUS
  Administered 2019-08-19: 30 mg via INTRAVENOUS
  Administered 2019-08-19 (×2): 10 mg via INTRAVENOUS

## 2019-08-19 MED ORDER — ALUM & MAG HYDROXIDE-SIMETH 200-200-20 MG/5ML PO SUSP
30.0000 mL | ORAL | Status: DC | PRN
  Administered 2019-08-19: 30 mL via ORAL
  Filled 2019-08-19: qty 30

## 2019-08-19 MED ORDER — AMIODARONE HCL 200 MG PO TABS
400.0000 mg | ORAL_TABLET | Freq: Two times a day (BID) | ORAL | Status: DC
  Administered 2019-08-19 – 2019-08-21 (×5): 400 mg via ORAL
  Filled 2019-08-19 (×5): qty 2

## 2019-08-19 NOTE — Progress Notes (Addendum)
Progress Note  Patient Name: Julie Rice Date of Encounter: 08/19/2019  Primary Cardiologist: Charlton Haws, MD   Subjective   Patient converted to NSR prior to DCCV. Patient is feeling better. Breathing has improved. Still feels nauseous sometimes. Denies chest pain.   Inpatient Medications    Scheduled Meds: . apixaban  5 mg Oral BID  . gabapentin  100 mg Oral TID  . metoprolol tartrate  50 mg Oral BID  . sucralfate  1 g Oral TID WC & HS   Continuous Infusions:  PRN Meds: acetaminophen **OR** acetaminophen, albuterol, HYDROcodone-acetaminophen, HYDROmorphone (DILAUDID) injection, ondansetron (ZOFRAN) IV   Vital Signs    Vitals:   08/19/19 0531 08/19/19 0717 08/19/19 0811 08/19/19 0820  BP: 125/76 122/80 (!) 110/44 (!) 111/46  Pulse: 97 (!) 51 68 62  Resp: 16 18 19  (!) 21  Temp: 97.8 F (36.6 C) 98.1 F (36.7 C) 97.8 F (36.6 C)   TempSrc: Oral Oral Axillary   SpO2: 90% 96% 97% 97%  Weight:  65.3 kg    Height:  5\' 3"  (1.6 m)      Intake/Output Summary (Last 24 hours) at 08/19/2019 0842 Last data filed at 08/19/2019 0802 Gross per 24 hour  Intake 1362.4 ml  Output 525 ml  Net 837.4 ml   Last 3 Weights 08/19/2019 08/18/2019 08/17/2019  Weight (lbs) 143 lb 15.4 oz 143 lb 15.4 oz 144 lb 14.4 oz  Weight (kg) 65.3 kg 65.3 kg 65.726 kg      Telemetry    NSR, Hr in the 60s - Personally Reviewed  ECG    NSR, 63 bpm, TWI V1-V5, nonspecific ST changes - Personally Reviewed  Physical Exam   GEN: No acute distress.   Neck: No JVD Cardiac: RRR, systolic murmurs, rubs, or gallops.  Respiratory: Diffuse wheezing  GI: Soft, nontender, non-distended  MS: No edema; No deformity. Neuro:  Nonfocal  Psych: Normal affect   Labs    High Sensitivity Troponin:   Recent Labs  Lab 08/11/19 0049 08/16/19 2035 08/16/19 2212 08/17/19 0030 08/17/19 0223  TROPONINIHS 20* 35* 32* 32* 30*      Chemistry Recent Labs  Lab 08/17/19 0400 08/18/19 0314  08/19/19 0429  NA 141 142 139  K 3.8 4.9 3.5  CL 103 105 102  CO2 25 29 25   GLUCOSE 96 99 101*  BUN 8 6* 7*  CREATININE 0.68 0.78 0.94  CALCIUM 8.3* 8.0* 8.4*  PROT 5.7*  --   --   ALBUMIN 2.9*  --   --   AST 18  --   --   ALT 17  --   --   ALKPHOS 62  --   --   BILITOT 0.6  --   --   GFRNONAA >60 >60 57*  GFRAA >60 >60 >60  ANIONGAP 13 8 12      Hematology Recent Labs  Lab 08/17/19 0400 08/18/19 0314 08/19/19 0429  WBC 9.3 7.9 8.1  RBC 3.23*  3.23* 3.10* 3.12*  HGB 9.3* 8.6* 8.8*  HCT 30.0* 28.6* 28.6*  MCV 92.9 92.3 91.7  MCH 28.8 27.7 28.2  MCHC 31.0 30.1 30.8  RDW 15.8* 15.9* 16.2*  PLT 303 267 215    BNP Recent Labs  Lab 08/17/19 2020  BNP 599.2*     DDimer  Recent Labs  Lab 08/17/19 0030  DDIMER 1.85*     Radiology    No results found.  Cardiac Studies   TEE echo 08/19/19  1. Left ventricular ejection fraction, by visual estimation, is 25 to 30%. The left ventricle has severely decreased function. There is no left ventricular hypertrophy.  2. Global right ventricle has normal systolic function.The right ventricular size is normal.  3. Left atrial size was severely dilated.  4. Right atrial size was moderately dilated.  5. Mild mitral annular calcification.  6. The mitral valve is normal in structure. Moderate mitral valve regurgitation.  7. The tricuspid valve is normal in structure. Tricuspid valve regurgitation moderate.  8. The aortic valve is tricuspid Aortic valve regurgitation is mild by color flow Doppler. Mild aortic valve sclerosis without stenosis.  9. The pulmonic valve was grossly normal. Pulmonic valve regurgitation is mild by color flow Doppler. 10. Mild plaque invoving the descending aorta. 11. Severe global reduction in LV systolic function; biatrial enlargement; atrial septal aneurysm; no LAA thrombus; mild AI; moderate MR and TR.  Echo 08/17/2019 1. Left ventricular ejection fraction, by visual estimation, is 45 to  50%. Assessment of LV function complicated by atrial fibrillation, but overall appears mildly decreased global systolic function. Normal left ventricular size. There is mildly  increased left ventricular hypertrophy. 2. Left ventricular diastolic Doppler parameters are indeterminate pattern of LV diastolic filling. 3. Global right ventricle has normal systolic function.The right ventricular size is normal. No increase in right ventricular wall thickness. 4. Left atrial size was severely dilated. 5. Right atrial size was moderately dilated. 6. Moderate pleural effusion in the left lateral region. 7. Trivial pericardial effusion is present. 8. The mitral valve is abnormal. Mild to moderate mitral valve regurgitation. EROA 0.15, RV 23cc 9. The tricuspid valve is normal in structure. Tricuspid valve regurgitation is mild. 10. The aortic valve is tricuspid Aortic valve regurgitation is mild by color flow Doppler. 11. Mildly elevated pulmonary artery systolic pressure. RVSP 42 mmHg 12. The inferior vena cava is dilated in size with >50% respiratory variability, suggesting right atrial pressure of 8 mmHg.  07/26/19  Right dominant coronary anatomy  Widely patent left main  30 to 40% mid LAD  Widely patent circumflex  Luminal irregularities in a dominant right coronary.  Right heart pressures are relatively low. Pulmonary wedge pressure mean is 2 mmHg.  LVEDP 4 mmHg. Left ventriculography by hand-injection was not helpful. From images obtained, LV function appears relatively normal.  RECOMMENDATIONS:   Per treating team.  Relatively volume contracted and therefore we will give normal saline 100 cc/h for the next 4 hours  Patient Profile     83 y.o. female with a hx of systolic CHF, prolonged QT, hypertension, hyperlipidemia, fibromyalgia, esophageal stricture, large symptomatic hiatal hernia s/p fundoplication, chronic dysphagia, asthma/COPD 2L O2 at baseline, CAD, TIA,  bipolar, RAwho is being seen today for the evaluation of chest pain and new onset afib RVR as well as mild acute heart failure.   Assessment & Plan    New onset Afib>> Now in NSR - Patient went for TEE/DCCV this morning and was found to be in NSR.  - EKG this AM shows NSR 63 bpm - CHADSVASC = 7 - GI with no plan for a procedure >>continued with PPI and carafate - Patient was on IV heparin >> will transition to Eliquis 5 mg. Creatinine is 0.94 - Rate control with metoprolol 50 mg BID. HR now in the 60-70s.  - EP recommend amiodarone for antiarrythmic.  - Consider afib clinic as outpatient  Chest pain/Nonobstructive CAD - LHC 07/26/19 showed 30-40% stenosis of the mid LAD - Hs troponin  32 > 32 > 30 - Chest pain likely due to afib RVR or GI etiology>>GI to see - No further ischemic evaluation at this time.  Acute onChronic systolic CHF - EF this admission had improved to 45-50%. However TEE echo estimated LV function of 30% - CoregandEntrestohome meds wereheld for soft pressures - Metoprolol 50 mg BID for rate control. Will add low dose losartan with the plan to switch to entresto if pressures allow. -Patient has been on IV Lasix 40 mg daily. Breathing has improved. Would hold further diuresis for now and monitor BP with addition of ARB.  - Patient was on spiro 12.5 mg BID at baseline but held on admission > consider adding back as maintenance dose  - Creatinine 0.78 > 0.94 - Weight down 1 lb since admission.   Moderate MR and TR - Noted on echo as well as TEE  HTN - Norvasc and coreg and entresto home meds>> held due to softer pressures - BP today 123/70 - Continue metoprolol  Will add Losartan 25 mg daily  Hyperlipidemia - LDL 131 04/25/2017 - Not on medication at baseline  Prolonged Qt - Monitor - QTc - EKG pending this AM  COPD -On 2L O2 at baseline   For questions or updates, please contact [facility] Please consult [email or web address] for contact  info under        Signed, Cadence David Stall, PA-C  08/19/2019, 8:42 AM    I have examined the patient and reviewed assessment and plan and discussed with patient.  Agree with above as stated.  EF lower by TEE.  Discussed case with Dr. Elberta Fortis yesterday.  She is not a candidate for Tikosyn due to QT interval.  We will start amiodarone 400 mg p.o. twice daily.  Hopefully, this can be decreased in the future to help her maintain sinus rhythm.  Given the moderate mitral regurgitation and dilated left atrium, I think she will need antiarrhythmic drug.  Lance Muss

## 2019-08-19 NOTE — Anesthesia Postprocedure Evaluation (Signed)
Anesthesia Post Note  Patient: Julie Rice  Procedure(s) Performed: TRANSESOPHAGEAL ECHOCARDIOGRAM (TEE) (N/A )     Patient location during evaluation: Endoscopy Anesthesia Type: MAC Level of consciousness: awake and alert Pain management: pain level controlled Vital Signs Assessment: post-procedure vital signs reviewed and stable Respiratory status: spontaneous breathing, nonlabored ventilation, respiratory function stable and patient connected to nasal cannula oxygen Cardiovascular status: blood pressure returned to baseline and stable Postop Assessment: no apparent nausea or vomiting Anesthetic complications: no    Last Vitals:  Vitals:   08/19/19 0820 08/19/19 0842  BP: (!) 111/46 123/70  Pulse: 62 69  Resp: (!) 21 20  Temp:  36.4 C  SpO2: 97% 99%    Last Pain:  Vitals:   08/19/19 0842  TempSrc: Oral  PainSc:                  Fritzi Scripter L Kareen Jefferys

## 2019-08-19 NOTE — Anesthesia Preprocedure Evaluation (Addendum)
Anesthesia Evaluation  Patient identified by MRN, date of birth, ID band Patient awake    Reviewed: Allergy & Precautions, NPO status , Patient's Chart, lab work & pertinent test results  History of Anesthesia Complications (+) PONV and history of anesthetic complications  Airway Mallampati: II  TM Distance: >3 FB Neck ROM: Full  Mouth opening: Limited Mouth Opening  Dental no notable dental hx. (+) Teeth Intact, Dental Advisory Given   Pulmonary shortness of breath and Long-Term Oxygen Therapy, asthma , COPD (2L O2),  oxygen dependent,    Pulmonary exam normal breath sounds clear to auscultation       Cardiovascular hypertension, + CAD and +CHF  Normal cardiovascular exam+ dysrhythmias Atrial Fibrillation  Rhythm:Regular Rate:Normal  LHC/RHC 07/2019 Right dominant coronary anatomy Widely patent left main 30 to 40% mid LAD Widely patent circumflex Luminal irregularities in a dominant right coronary. Right heart pressures are relatively low.  Pulmonary wedge pressure mean is 2 mmHg. LVEDP 4 mmHg.  Left ventriculography by hand-injection was not helpful.  From images obtained, LV function appears relatively normal.  TTE 08/17/19   1. Left ventricular ejection fraction, by visual estimation, is 45 to 50%. Assessment of LV function complicated by atrial fibrillation, but overall appears mildly decreased global systolic function. Normal left ventricular size. There is mildly  increased left ventricular hypertrophy.  2. Left ventricular diastolic Doppler parameters are indeterminate pattern of LV diastolic filling.  3. Global right ventricle has normal systolic function.The right ventricular size is normal. No increase in right ventricular wall thickness.  4. Left atrial size was severely dilated.  5. Right atrial size was moderately dilated.  6. Moderate pleural effusion in the left lateral region.  7. Trivial pericardial  effusion is present.  8. The mitral valve is abnormal. Mild to moderate mitral valve regurgitation. EROA 0.15, RV 23cc  9. The tricuspid valve is normal in structure. Tricuspid valve regurgitation is mild. 10. The aortic valve is tricuspid Aortic valve regurgitation is mild by color flow Doppler. 11. Mildly elevated pulmonary artery systolic pressure. RVSP 42 mmHg 12. The inferior vena cava is dilated in size with >50% respiratory variability, suggesting right atrial pressure of 8 mmHg.   Neuro/Psych PSYCHIATRIC DISORDERS Anxiety Depression Bipolar Disorder TIA   GI/Hepatic Neg liver ROS, hiatal hernia, GERD  ,  Endo/Other  Hypothyroidism   Renal/GU negative Renal ROS  negative genitourinary   Musculoskeletal  (+) Arthritis , Rheumatoid disorders,  Fibromyalgia -  Abdominal   Peds  Hematology  (+) Blood dyscrasia (Hgb 8.8), anemia ,   Anesthesia Other Findings Initially admitted on 10/3/20200 with shortness of breath and chest pain was found to have lower lobe pneumonia thought to be secondary to aspiration now admitted with new onset Afib with RVR on heparin and cardizem gtt  Reproductive/Obstetrics                            Anesthesia Physical Anesthesia Plan  ASA: IV  Anesthesia Plan: General   Post-op Pain Management:    Induction: Intravenous  PONV Risk Score and Plan: Propofol infusion and Treatment may vary due to age or medical condition  Airway Management Planned: Natural Airway and Mask  Additional Equipment:   Intra-op Plan:   Post-operative Plan:   Informed Consent: I have reviewed the patients History and Physical, chart, labs and discussed the procedure including the risks, benefits and alternatives for the proposed anesthesia with the patient or authorized representative who  has indicated his/her understanding and acceptance.     Dental advisory given  Plan Discussed with: CRNA  Anesthesia Plan Comments:          Anesthesia Quick Evaluation

## 2019-08-19 NOTE — Progress Notes (Signed)
PROGRESS NOTE  Julie Rice KDT:[id number] DOB: 01-Aug-1936 DOA: 08/16/2019 PCP: [name], NP  Brief History   83 y.o.femalewith medical history significant of hypertension, hyperlipidemia, systolic CHF, fibromyalgia, anemia, esophageal stricture s/p balloon dilation, large symptomatic hiatal hernia status post fundoplication, chronic dysphagia, asthma/COPD on oxygen at 2 L at home, CAD, TIA, bipolar disorderfibromyalgia, rheumatoid arthritis, TIA, chronic urinary incontinence.    She presented with heart rate 60s-140at home per home health nurse.  Patient has not filled her medications since her discharge with still having some intermittent chest pain and she was told to come to emergency department.  She reports while hospitalized she has an episode of tachycardia with HR p to 170's she was given a medicine and then her HR has come down. She has no known hx of A.fib  She took 2 nitro and 3/24 of aspirin and presented to emergency department.  Chest pain report on and off for weeks, is worse with swallowing.  Has been swallowing soft food ok.  Reported burning pain in her chest radiating to her back and arms.  She felt very weak and fell back wards no LOC.  No fever no chills.  Last night she felt sweaty her temp was 99.9.  Reports Shortness of breath since last summer but has been getting progressively worse.    Relative recent history: Initially admitted on 10/3/20200with shortness of breath and chest pain was found to have lower lobe pneumonia thought to be secondary to aspiration COVID negative treated with broad-spectrum antibiotics there was worrisome for esophageal stenosis GI was consulted and she underwent balloon dilatation and Botox injection Physical therapy seen her and recommended home health and discharge and she was discharged home on 9 October.  EKG showed no evidence of ischemic changes thought to be secondary to demand ischemia.  Consultants  . Cardiology . EP  cardiology . Gastroenterology  Procedures  . None  Antibiotics   Anti-infectives (From admission, onward)   None     Subjective  The patient is resting comfortably. Patient states that she is still having chest pain.  Objective   Vitals:  Vitals:   08/19/19 0842 08/19/19 1324  BP: 123/70 128/65  Pulse: 69 73  Resp: 20 16  Temp: 97.6 F (36.4 C) 98.5 F (36.9 C)  SpO2: 99% 97%   Exam:  Constitutional:  . The patient is awake, alert, and oriented x 3. No acute distress. Respiratory:  . No increased work of breathing. . No wheezes, rales, or rhonchi . No tactile fremitus Cardiovascular:  . Regular rate and rhythm . No murmurs, ectopy, or gallups. . No lateral PMI. No thrills. Abdomen:  . Abdomen is soft, non-tender, non-distended . No hernias, masses, or organomegaly . Normoactive bowel sounds.  Musculoskeletal:  . No cyanosis, clubbing, or edema Skin:  . No rashes, lesions, ulcers . palpation of skin: no induration or nodules Neurologic:  . CN 2-12 intact . Sensation all 4 extremities intact Psychiatric:  . Mental status o Mood, affect appropriate o Orientation to person, place, time  . judgment and insight appear intact   I have personally reviewed the following:   Today's Data  . Vitals, BMP, CBC  Imaging  . CTA chest  Scheduled Meds: . amiodarone  400 mg Oral BID  . apixaban  5 mg Oral BID  . gabapentin  100 mg Oral TID  . losartan  25 mg Oral Daily  . metoprolol tartrate  50 mg Oral BID  . sucralfate  1  g Oral TID WC & HS   Continuous Infusions:   Principal Problem:   Atrial fibrillation with RVR (HCC) Active Problems:   Hypothyroidism   Dyslipidemia   UNSPECIFIED ANEMIA   Essential hypertension   ESOPHAGEAL STRICTURE   Hiatal hernia with gastroesophageal reflux   Esophageal dysmotility   Asthma   Chest pain   CHF (congestive heart failure) (HCC)   Prolonged QT interval   Acute on chronic respiratory failure with hypoxia  (HCC)   Chronic respiratory failure with hypoxia (HCC)   COPD (chronic obstructive pulmonary disease) (HCC)   Elevated troponin Esophageal dilatation with delayed transit of food  LOS: 2 days   A & P  Atrial fibrillation with RVR: New onset. CHA2D-VASC score 5.  TSH within normal limits (free T4 slightly up).  Echo 10/12 EF 45-50%, severe LAE, mild/mod MR, moderaly dilated RA.  Appreciate EP's assistance. No intervention planned until GI has addressed esophageal issue. She will be continued on the heparin and cardizem drip for now. Thyroid studies are within normal limits. Cardiology has been consulted as has EP.  Pt was taken down for DCCV, but she has converted to sinus rhythm. It is thought that her chest pain is due to atrial fibrillation.  Chest pain: Suspect this is GI/esophageal in origin given recent dilation for stricture/stenosis.  Patient had 10/10 pain radiation to the back.  Was concerned for complication of the dilation vs aortic dissection.  Obtained CTA which ruled out both but did show a dilated and fluid filled esophagus (stable from prior).  Had recentcardiac catheterization which  is reassuring.  GI has been consulted. The patient is receiving Carafate as at home and low dose Dilaudid PRN.  It is thought that her chest pain is due to atrial fibrillation. She has converted to sinus rhythm.  Elevated Troponin: Likely due to demand ischemia secondary to above.  Patient had left heart cath in Sept 2020 that show non-obstructive CAD. As per cardiology.  Hypothyroidism: Thyroid studies are within normal limits.  Dyslipidemia: Noted, chronic, and stable  Anemia: AOCD. Monitor.  Essential hypertension: Somewhat soft bp while in a.fib w RVR. Home antihypertensives were held.  GERD:  chronic stable  Prolonged QT interval: Will monitor on telemetry. Avoid QT prolonging medications. Maintain K>4, Mg>2.  Hx of CHF:  Systolic CHF hold Entresto for tonight given soft BP  pressures. Patient appears to be on a dry side will gently rehydrate. Monitor volume status closely.  Chronic respiratory failure with hypoxia [facility]): Currently at baseline continue to monitor continue home oxygen. Given increase dyspnea and tachycardia will check d.dimer.  Asthma: Not acutely exacerbated.  patient states she never smoked. Continue home medications currently stable.  I have seen and examined this patient myself. I have spent 32 minutes in her evaluation and care.  DVT prophylaxis: heparin Code Status:   Code Status: Full Code  Family Communication: none at bedside Disposition Plan: pending clinical improvement,  likely home (vs SNF based on PT recs). Est 2-3 days.  Diamone Whistler, DO [facility] Direct contact: see [email or web address]  7PM-7AM contact night coverage as above 08/19/2019, 4:01 PM  LOS: 1 day

## 2019-08-19 NOTE — Progress Notes (Signed)
  Echocardiogram 2D Echocardiogram has been performed.  Julie Rice 08/19/2019, 8:10 AM

## 2019-08-19 NOTE — Interval H&P Note (Signed)
History and Physical Interval Note:  08/19/2019 7:32 AM  Julie Rice  has presented today for surgery, with the diagnosis of Afib.  The various methods of treatment have been discussed with the patient and family. After consideration of risks, benefits and other options for treatment, the patient has consented to  Procedure(s): TRANSESOPHAGEAL ECHOCARDIOGRAM (TEE) (N/A) CARDIOVERSION (N/A) as a surgical intervention.  The patient's history has been reviewed, patient examined, no change in status, stable for surgery.  I have reviewed the patient's chart and labs.  Questions were answered to the patient's satisfaction.     Kirk Ruths

## 2019-08-19 NOTE — Progress Notes (Signed)
Transitions of Care Pharmacist Note  Julie Rice is a 83 y.o. female that has been diagnosed with a Atrial fibrillation and will be prescribed Eliquis (apixaban) at discharge.   Patient Education: I provided the following education on 10/15 to the patient: How to take the medication Described what the medication is Signs of bleeding Signs/symptoms of VTE and stroke  Answered their questions  Discharge Medications Plan: The patient wants to have their discharge medications filled by the Transitions of Care pharmacy rather than their usual pharmacy.  The discharge orders pharmacy has been changed to the Transitions of Care pharmacy, the patient will receive a phone call regarding co-pay, and their medications will be delivered by the Transitions of Care pharmacy.   Insurance information: BIN: [id number] PCN: [place] Group number: [id number] Member ID number: [id number]   Thank you,   Kennon Holter, PharmD PGY1 Ambulatory Care Pharmacy Resident [employer] Phone: [phone] August 19, 2019

## 2019-08-19 NOTE — Progress Notes (Signed)
ANTICOAGULATION CONSULT NOTE  Pharmacy Consult for Heparin Indication: chest pain/Afib  Patient Measurements: Height: 5\' 3"  (160 cm) Weight: 143 lb 15.4 oz (65.3 kg) IBW/kg (Calculated) : 52.4  Vital Signs: Temp: 97.8 F (36.6 C) (10/15 0531) Temp Source: Oral (10/15 0531) BP: 125/76 (10/15 0531) Pulse Rate: 97 (10/15 0531)  Labs: Recent Labs    08/16/19 2035 08/16/19 2212 08/17/19 0030 08/17/19 0223 08/17/19 0400  08/18/19 0314 08/18/19 1449 08/18/19 2301 08/19/19 0429  HGB 9.0*  --   --   --  9.3*  --  8.6*  --   --   --   HCT 28.1*  --   --   --  30.0*  --  28.6*  --   --   --   PLT 305  --   --   --  303  --  267  --   --   --   HEPARINUNFRC  --   --   --   --   --    < > 0.27* 0.16* 0.39 0.46  CREATININE 0.85  --   --   --  0.68  --  0.78  --   --  0.94  TROPONINIHS 35* 32* 32* 30*  --   --   --   --   --   --    < > = values in this interval not displayed.    Estimated Creatinine Clearance: 42.7 mL/min (by C-G formula based on SCr of 0.94 mg/dL).  Assessment: 83 y.o. female with chest pain and afib continues on IV heparin. No anticoagulation PTA. Per Dr. Curt Bears, no TEE/DCCV or AAD until GI weighs in on any needed procedures from their perspective and recommendations regarding anticoagulation . Given esophageal issues, might consider rate control and 3 weeks of uninterrupted anticoagulation followed by DCCV without TEE.  Heparin level this morning remains therapeutic after 1 increase in heparin infusion rate. No overt bleeding or complications noted.  Goal of Therapy:  Heparin level 0.3-0.7 units/ml Monitor platelets by anticoagulation protocol: Yes   Plan:  Continue heparin gtt at 1300 units/hr Monitor daily heparin level and CBC Watch for s/sx of bleeding F/u plans for oral anticoagulation   Kennon Holter, PharmD PGY1 Ambulatory Care Pharmacy Resident [employer] Phone: [phone]'  08/19/2019 6:32 AM

## 2019-08-19 NOTE — CV Procedure (Signed)
    Transesophageal Echocardiogram Note  TAWAN CORKERN [id number] 06-01-1936  Procedure: Transesophageal Echocardiogram Indications: atrial fibrillation  Procedure Details Consent: Obtained Time Out: Verified patient identification, verified procedure, site/side was marked, verified correct patient position, special equipment/implants available, Radiology Safety Procedures followed,  medications/allergies/relevent history reviewed, required imaging and test results available.  Performed  Medications:  Pt sedated by anesthesia with diprovan 160 mg IV total.  Severe LV dysfunction (EF 30); severe LAE; no LAA thrombus; moderate RAE; moderate MR and TR; mild AI.  As pt was being prepared for DCCV, she converted to sinus spontaneously; continue apixaban.  Complications: No apparent complications Patient did tolerate procedure well.  Kirk Ruths, MD

## 2019-08-19 NOTE — Progress Notes (Signed)
Pt has had nausea and indigestion since this am- worse than previous day. Day shift RN gave Zofran near shift change with slight relief- pt stated during bedside report she had "really bad heartburn" and she stated that the only thing she tried to eat from her diner tray was crackers and she could not keep that down either due to the heartburn. Pt wanted to try maalox. However, she vomited this up as well.  Will page MD with this information.

## 2019-08-19 NOTE — Transfer of Care (Signed)
Immediate Anesthesia Transfer of Care Note  Patient: Julie Rice  Procedure(s) Performed: TRANSESOPHAGEAL ECHOCARDIOGRAM (TEE) (N/A )  Patient Location: Endoscopy Unit  Anesthesia Type:MAC  Level of Consciousness: awake, alert , oriented and patient cooperative  Airway & Oxygen Therapy: Patient Spontanous Breathing and Patient connected to nasal cannula oxygen  Post-op Assessment: Report given to RN and Post -op Vital signs reviewed and stable  Post vital signs: Reviewed and stable  Last Vitals:  Vitals Value Taken Time  BP    Temp    Pulse 57 08/19/19 0810  Resp 19 08/19/19 0810  SpO2 86 % 08/19/19 0810  Vitals shown include unvalidated device data.  Last Pain:  Vitals:   08/19/19 0717  TempSrc: Oral  PainSc: 0-No pain      Patients Stated Pain Goal: 0 (02/72/53 6644)  Complications: No apparent anesthesia complications

## 2019-08-19 NOTE — Discharge Instructions (Signed)

## 2019-08-20 LAB — BASIC METABOLIC PANEL
Anion gap: 15 (ref 5–15)
BUN: 11 mg/dL (ref 8–23)
CO2: 23 mmol/L (ref 22–32)
Calcium: 8.5 mg/dL — ABNORMAL LOW (ref 8.9–10.3)
Chloride: 100 mmol/L (ref 98–111)
Creatinine, Ser: 0.91 mg/dL (ref 0.44–1.00)
GFR calc Af Amer: >60 mL/min (ref >60)
GFR calc non Af Amer: 59 mL/min — ABNORMAL LOW (ref >60)
Glucose, Bld: 115 mg/dL — ABNORMAL HIGH (ref 70–99)
Potassium: 3.5 mmol/L (ref 3.5–5.1)
Sodium: 138 mmol/L (ref 135–145)

## 2019-08-20 LAB — MAGNESIUM: Magnesium: 2 mg/dL (ref 1.7–2.4)

## 2019-08-20 MED ORDER — METOPROLOL SUCCINATE ER 100 MG PO TB24
100.0000 mg | ORAL_TABLET | Freq: Every day | ORAL | Status: DC
  Filled 2019-08-20: qty 1

## 2019-08-20 MED ORDER — METOPROLOL TARTRATE 5 MG/5ML IV SOLN
10.0000 mg | Freq: Four times a day (QID) | INTRAVENOUS | Status: DC
  Administered 2019-08-20 – 2019-08-21 (×6): 10 mg via INTRAVENOUS
  Administered 2019-08-22: 5 mg via INTRAVENOUS
  Administered 2019-08-22: 10 mg via INTRAVENOUS
  Filled 2019-08-20 (×9): qty 10

## 2019-08-20 MED ORDER — POTASSIUM CHLORIDE 10 MEQ/100ML IV SOLN
10.0000 meq | INTRAVENOUS | Status: AC
  Administered 2019-08-20 (×3): 10 meq via INTRAVENOUS
  Filled 2019-08-20 (×2): qty 100

## 2019-08-20 MED ORDER — FUROSEMIDE 10 MG/ML IJ SOLN
40.0000 mg | Freq: Once | INTRAMUSCULAR | Status: AC
  Administered 2019-08-20: 40 mg via INTRAVENOUS
  Filled 2019-08-20: qty 4

## 2019-08-20 MED ORDER — POTASSIUM CHLORIDE 10 MEQ/100ML IV SOLN
INTRAVENOUS | Status: AC
  Administered 2019-08-20: 10 meq via INTRAVENOUS
  Filled 2019-08-20: qty 100

## 2019-08-20 MED ORDER — CLONAZEPAM 0.5 MG PO TABS
0.5000 mg | ORAL_TABLET | Freq: Two times a day (BID) | ORAL | Status: AC | PRN
  Administered 2019-08-20 – 2019-08-21 (×2): 0.5 mg via ORAL
  Filled 2019-08-20 (×2): qty 1

## 2019-08-20 MED ORDER — PANTOPRAZOLE SODIUM 40 MG IV SOLR
40.0000 mg | INTRAVENOUS | Status: DC
  Administered 2019-08-20 – 2019-08-22 (×3): 40 mg via INTRAVENOUS
  Filled 2019-08-20 (×3): qty 40

## 2019-08-20 MED ORDER — SUCRALFATE 1 GM/10ML PO SUSP
1.0000 g | Freq: Four times a day (QID) | ORAL | Status: DC
  Administered 2019-08-20 – 2019-08-23 (×13): 1 g via ORAL
  Filled 2019-08-20 (×14): qty 10

## 2019-08-20 MED ORDER — METOCLOPRAMIDE HCL 5 MG/ML IJ SOLN
10.0000 mg | Freq: Four times a day (QID) | INTRAMUSCULAR | Status: DC
  Administered 2019-08-20: 10 mg via INTRAVENOUS
  Filled 2019-08-20: qty 2

## 2019-08-20 MED ORDER — POTASSIUM CHLORIDE 10 MEQ/100ML IV SOLN
10.0000 meq | Freq: Once | INTRAVENOUS | Status: AC
  Administered 2019-08-20: 10 meq via INTRAVENOUS
  Filled 2019-08-20: qty 100

## 2019-08-20 MED ORDER — PANTOPRAZOLE SODIUM 40 MG PO TBEC
40.0000 mg | DELAYED_RELEASE_TABLET | Freq: Every day | ORAL | Status: DC
  Filled 2019-08-20: qty 1

## 2019-08-20 MED ORDER — SACUBITRIL-VALSARTAN 24-26 MG PO TABS
1.0000 | ORAL_TABLET | Freq: Two times a day (BID) | ORAL | Status: DC
  Filled 2019-08-20: qty 1

## 2019-08-20 MED ORDER — ONDANSETRON HCL 4 MG/2ML IJ SOLN
4.0000 mg | Freq: Four times a day (QID) | INTRAMUSCULAR | Status: DC
  Administered 2019-08-20 (×2): 4 mg via INTRAVENOUS
  Filled 2019-08-20 (×2): qty 2

## 2019-08-20 MED ORDER — AMLODIPINE BESYLATE 5 MG PO TABS: 5.0000 mg | ORAL_TABLET | Freq: Every day | ORAL | Status: DC

## 2019-08-20 MED ORDER — POTASSIUM CHLORIDE CRYS ER 20 MEQ PO TBCR
40.0000 meq | EXTENDED_RELEASE_TABLET | Freq: Once | ORAL | Status: DC
  Filled 2019-08-20: qty 2

## 2019-08-20 MED ORDER — AMLODIPINE BESYLATE 10 MG PO TABS
10.0000 mg | ORAL_TABLET | Freq: Every day | ORAL | Status: DC
  Administered 2019-08-20 – 2019-08-21 (×2): 10 mg via ORAL
  Filled 2019-08-20 (×2): qty 1

## 2019-08-20 NOTE — Progress Notes (Addendum)
Progress Note  Patient Name: Julie Rice Date of Encounter: 08/20/2019  Primary Cardiologist: Jenkins Rouge, MD   Subjective   C/o N&V, po intake causes burning chest pain, carafate no help  Inpatient Medications    Scheduled Meds: . amiodarone  400 mg Oral BID  . apixaban  5 mg Oral BID  . gabapentin  100 mg Oral TID  . losartan  25 mg Oral Daily  . metoprolol tartrate  50 mg Oral BID  . sucralfate  1 g Oral TID WC & HS   Continuous Infusions:  PRN Meds: acetaminophen **OR** acetaminophen, albuterol, alum & mag hydroxide-simeth, HYDROcodone-acetaminophen, HYDROmorphone (DILAUDID) injection, ondansetron (ZOFRAN) IV   Vital Signs    Vitals:   08/19/19 0842 08/19/19 1324 08/19/19 2226 08/20/19 0410  BP: 123/70 128/65 132/65 (!) 142/58  Pulse: 69 73 71 67  Resp: 20 16 19 20   Temp: 97.6 F (36.4 C) 98.5 F (36.9 C) 98.5 F (36.9 C) 98.1 F (36.7 C)  TempSrc: Oral Oral Oral Oral  SpO2: 99% 97% 95% 100%  Weight:    65.3 kg  Height:        Intake/Output Summary (Last 24 hours) at 08/20/2019 0731 Last data filed at 08/19/2019 0802 Gross per 24 hour  Intake 400 ml  Output -  Net 400 ml   Last 3 Weights 08/20/2019 08/19/2019 08/18/2019  Weight (lbs) 143 lb 15.4 oz 143 lb 15.4 oz 143 lb 15.4 oz  Weight (kg) 65.3 kg 65.3 kg 65.3 kg      Telemetry    SR, NSVT up to 9 bts - Personally Reviewed  ECG    NSR, 63 bpm, TWI V1-V5, nonspecific ST changes - Personally Reviewed  Physical Exam   General: Well developed, well nourished, female in no acute distress Head: Eyes PERRLA Normocephalic and atraumatic Lungs: rales bases to auscultation. Heart: HRRR S1 S2, without rub or gallop. No murmur.  Pulses are 2+ & equal. JVD 11 cm. Abdomen: Bowel sounds are present, abdomen soft and tender, worst in RUQ, without masses or  hernias noted. Msk: Normal strength and tone for age. Extremities: No clubbing, cyanosis or edema.    Skin:  No rashes or lesions noted.  Neuro: Alert and oriented X 3. Psych:  Good affect, responds appropriately  Labs    High Sensitivity Troponin:   Recent Labs  Lab 08/11/19 0049 08/16/19 2035 08/16/19 2212 08/17/19 0030 08/17/19 0223  TROPONINIHS 20* 35* 32* 32* 30*      Chemistry Recent Labs  Lab 08/17/19 0400 08/18/19 0314 08/19/19 0429 08/20/19 0238  NA 141 142 139 138  K 3.8 4.9 3.5 3.5  CL 103 105 102 100  CO2 25 29 25 23   GLUCOSE 96 99 101* 115*  BUN 8 6* 7* 11  CREATININE 0.68 0.78 0.94 0.91  CALCIUM 8.3* 8.0* 8.4* 8.5*  PROT 5.7*  --   --   --   ALBUMIN 2.9*  --   --   --   AST 18  --   --   --   ALT 17  --   --   --   ALKPHOS 62  --   --   --   BILITOT 0.6  --   --   --   GFRNONAA >60 >60 57* 59*  GFRAA >60 >60 >60 >60  ANIONGAP 13 8 12 15      Hematology Recent Labs  Lab 08/17/19 0400 08/18/19 0314 08/19/19 0429  WBC 9.3 7.9 8.1  RBC 3.23*  3.23* 3.10* 3.12*  HGB 9.3* 8.6* 8.8*  HCT 30.0* 28.6* 28.6*  MCV 92.9 92.3 91.7  MCH 28.8 27.7 28.2  MCHC 31.0 30.1 30.8  RDW 15.8* 15.9* 16.2*  PLT 303 267 215    BNP Recent Labs  Lab 08/17/19 2020  BNP 599.2*     DDimer  Recent Labs  Lab 08/17/19 0030  DDIMER 1.85*    Lab Results  Component Value Date   CHOL 127 08/19/2019   HDL 42 08/19/2019   LDLCALC 64 08/19/2019   LDLDIRECT 122.2 06/01/2013   TRIG 103 08/19/2019   CHOLHDL 3.0 08/19/2019    Radiology    No results found.  Cardiac Studies   TEE echo 08/19/19   1. Left ventricular ejection fraction, by visual estimation, is 25 to 30%. The left ventricle has severely decreased function. There is no left ventricular hypertrophy.  2. Global right ventricle has normal systolic function.The right ventricular size is normal.  3. Left atrial size was severely dilated.  4. Right atrial size was moderately dilated.  5. Mild mitral annular calcification.  6. The mitral valve is normal in structure. Moderate mitral valve regurgitation.  7. The tricuspid valve is  normal in structure. Tricuspid valve regurgitation moderate.  8. The aortic valve is tricuspid Aortic valve regurgitation is mild by color flow Doppler. Mild aortic valve sclerosis without stenosis.  9. The pulmonic valve was grossly normal. Pulmonic valve regurgitation is mild by color flow Doppler. 10. Mild plaque invoving the descending aorta. 11. Severe global reduction in LV systolic function; biatrial enlargement; atrial septal aneurysm; no LAA thrombus; mild AI; moderate MR and TR.  Echo 08/17/2019 1. Left ventricular ejection fraction, by visual estimation, is 45 to 50%. Assessment of LV function complicated by atrial fibrillation, but overall appears mildly decreased global systolic function. Normal left ventricular size. There is mildly  increased left ventricular hypertrophy. 2. Left ventricular diastolic Doppler parameters are indeterminate pattern of LV diastolic filling. 3. Global right ventricle has normal systolic function.The right ventricular size is normal. No increase in right ventricular wall thickness. 4. Left atrial size was severely dilated. 5. Right atrial size was moderately dilated. 6. Moderate pleural effusion in the left lateral region. 7. Trivial pericardial effusion is present. 8. The mitral valve is abnormal. Mild to moderate mitral valve regurgitation. EROA 0.15, RV 23cc 9. The tricuspid valve is normal in structure. Tricuspid valve regurgitation is mild. 10. The aortic valve is tricuspid Aortic valve regurgitation is mild by color flow Doppler. 11. Mildly elevated pulmonary artery systolic pressure. RVSP 42 mmHg 12. The inferior vena cava is dilated in size with >50% respiratory variability, suggesting right atrial pressure of 8 mmHg.  07/26/19  Right dominant coronary anatomy  Widely patent left main  30 to 40% mid LAD  Widely patent circumflex  Luminal irregularities in a dominant right coronary.  Right heart pressures are relatively low.  Pulmonary wedge pressure mean is 2 mmHg.  LVEDP 4 mmHg. Left ventriculography by hand-injection was not helpful. From images obtained, LV function appears relatively normal.  RECOMMENDATIONS:   Per treating team.  Relatively volume contracted and therefore we will give normal saline 100 cc/h for the next 4 hours  Patient Profile     83 y.o. female with a hx of systolic CHF, prolonged QT, hypertension, hyperlipidemia, fibromyalgia, esophageal stricture, large symptomatic hiatal hernia s/p fundoplication, chronic dysphagia, asthma/COPD 2L O2 at baseline, CAD, TIA, bipolar, RAwho is being seen today for the evaluation of  chest pain and new onset afib RVR as well as mild acute heart failure.   Assessment & Plan    New onset Afib>> Now in NSR - S/P TEE w/ spont conversion to SR - CHA2DS2-VASc = 7  - on amio 400 mg bid, ?x 2 weeks, then 200 mg qd - continue Eliquis - on BB, HR good -  May need Afib clinic as outpt  Chest pain/Nonobstructive CAD - mild elevation in trop - mild CAD at recent cath - Now w/ burning w/ any po intake, N&V, can't keep anything down - Carafate no help - lower abdominal cramping and RUQ tenderness on exam - GI has seen, have called them, they will see again - for now, give Rx IV  Acute onChronic systolic CHF - EF has been trending down, ?2nd Afib RVR - Coreg>>metoprolol, change to Toprol XL when taking po's - losartan>>low-dose Entresto today - still w/ JVD & rales>>Lasix 40 mg IV x 1 - wt no change  Moderate MR and TR - follow, see echo report  HTN - Norvasc held on admit, d/c w/ decreased EF - Coreg>>metoprolol but all rx IV for now w/ GI issues - spiro held due to low BP - losartan added>>Entresto when taking POs  Hyperlipidemia - LDL 64 this admit  Prolonged Qt - QT 462 on 10/15 ECG - follow  COPD -per IM, continue home O2  NSVT  - seen on telemetry - K+ 3.5, will supplement - ck Mg   For questions or updates, please  contact [facility] Please consult [email or web address] for contact info under      Signed, Theodore Demark, PA-C  08/20/2019, 7:31 AM    I have examined the patient and reviewed assessment and plan and discussed with patient.  Agree with above as stated.    Maintaining NSR. At baseline home O2 level.   Diuresis attempted but I>O at this point.  EF severely decreased at TEE, with moderate MR.     She still has a lot of nausea.  THis has persisted and was present even before the amiodarone was started.  Will decrease Amio dose in a week or so.  Eliquis for stroke prevention.  Given that she is not eating much, ELiquis is a better choice than Xarelto.   Lance Muss

## 2019-08-20 NOTE — Care Management Important Message (Signed)
Important Message  Patient Details  Name: Julie Rice MRN: [id number] Date of Birth: 12-11-1935   Medicare Important Message Given:  Yes     Shelda Altes 08/20/2019, 12:14 PM

## 2019-08-20 NOTE — Progress Notes (Signed)
PROGRESS NOTE  Julie Rice XTK:[id number] DOB: 1936/07/26 DOA: 08/16/2019 PCP: Shirline Frees, NP  Brief History   83 y.o.femalewith medical history significant of hypertension, hyperlipidemia, systolic CHF, fibromyalgia, anemia, esophageal stricture s/p balloon dilation, large symptomatic hiatal hernia status post fundoplication, chronic dysphagia, asthma/COPD on oxygen at 2 L at home, CAD, TIA, bipolar disorderfibromyalgia, rheumatoid arthritis, TIA, chronic urinary incontinence.    She presented with heart rate 60s-140at home per home health nurse.  Patient has not filled her medications since her discharge with still having some intermittent chest pain and she was told to come to emergency department.  She reports while hospitalized she has an episode of tachycardia with HR p to 170's she was given a medicine and then her HR has come down. She has no known hx of A.fib  She took 2 nitro and 3/24 of aspirin and presented to emergency department.  Chest pain report on and off for weeks, is worse with swallowing.  Has been swallowing soft food ok.  Reported burning pain in her chest radiating to her back and arms.  She felt very weak and fell back wards no LOC.  No fever no chills.  Last night she felt sweaty her temp was 99.9.  Reports Shortness of breath since last summer but has been getting progressively worse.    Relative recent history: Initially admitted on 10/3/20200with shortness of breath and chest pain was found to have lower lobe pneumonia thought to be secondary to aspiration COVID negative treated with broad-spectrum antibiotics there was worrisome for esophageal stenosis GI was consulted and she underwent balloon dilatation and Botox injection Physical therapy seen her and recommended home health and discharge and she was discharged home on 9 October.  EKG showed no evidence of ischemic changes thought to be secondary to demand ischemia.  Consultants   Cardiology  EP  cardiology  Gastroenterology  Procedures   None  Antibiotics   Anti-infectives (From admission, onward)   None     Subjective  The patient has been vomiting all night long. She feels very bad.  Objective   Vitals:  Vitals:   08/20/19 1237 08/20/19 1659  BP: (!) 150/61 (!) 155/75  Pulse: (!) 59 63  Resp:  19  Temp:  98.1 F (36.7 C)  SpO2: 100% 100%   Exam:  Constitutional:   The patient is awake, alert, and oriented x 3. Moderate distress from nausea. Respiratory:   No increased work of breathing.  No wheezes, rales, or rhonchi  No tactile fremitus Cardiovascular:   Regular rate and rhythm  No murmurs, ectopy, or gallups.  No lateral PMI. No thrills. Abdomen:   Abdomen is soft, non-tender, non-distended  No hernias, masses, or organomegaly  Normoactive bowel sounds.  Musculoskeletal:   No cyanosis, clubbing, or edema Skin:   No rashes, lesions, ulcers  palpation of skin: no induration or nodules Neurologic:   CN 2-12 intact  Sensation all 4 extremities intact Psychiatric:   Mental status o Mood, affect appropriate o Orientation to person, place, time   judgment and insight appear intact  I have personally reviewed the following:   Today's Data   Vitals, BMP  Imaging   CTA chest  Scheduled Meds:  amiodarone  400 mg Oral BID   amLODipine  10 mg Oral Daily   apixaban  5 mg Oral BID   gabapentin  100 mg Oral TID   metoprolol tartrate  10 mg Intravenous Q6H   ondansetron  4 mg Intravenous Q6H  pantoprazole (PROTONIX) IV  40 mg Intravenous Q24H   sucralfate  1 g Oral Q6H   Continuous Infusions:   Principal Problem:   Atrial fibrillation with RVR (HCC) Active Problems:   Hypothyroidism   Dyslipidemia   UNSPECIFIED ANEMIA   Essential hypertension   ESOPHAGEAL STRICTURE   Hiatal hernia with gastroesophageal reflux   Esophageal dysmotility   Asthma   Chest pain   CHF (congestive heart failure) (HCC)    Prolonged QT interval   Acute on chronic respiratory failure with hypoxia (HCC)   Chronic respiratory failure with hypoxia (HCC)   COPD (chronic obstructive pulmonary disease) (HCC)   Elevated troponin Esophageal dilatation with delayed transit of food  LOS: 3 days   A & P  Atrial fibrillation with RVR: New onset. CHA2D-VASC score 5.  TSH within normal limits (free T4 slightly up).  Echo 10/12 EF 45-50%, severe LAE, mild/mod MR, moderaly dilated RA.  Appreciate EP's assistance. No intervention planned until GI has addressed esophageal issue. She will be continued on the heparin and cardizem drip for now. Thyroid studies are within normal limits. Cardiology has been consulted as has EP.  Pt was taken down for DCCV, but she has converted to sinus rhythm. It is thought that her chest pain is due to atrial fibrillation.  Chest pain:  Patient had 10/10 pain radiation to the back.  Was concerned for complication of the dilation vs aortic dissection.  Obtained CTA which ruled out both but did show a dilated and fluid filled esophagus (stable from prior).  Had recentcardiac catheterization which  is reassuring.  GI has been consulted, but they state that they feel that pain is due to atrial fibrillation. The patient was taken down for DCCV by cardiology, but she converted to sinus first.  The patient is receiving Carafate as at home and low dose Dilaudid PRN.   Intractable nausea and vomiting: Due to stenosis of distal esophagus and partial obstruction by large hiatal hernia. I have discussed the patient with [name], as he performed the dilatation on 08/12/2019. He stated that the patient had botox injections at the site, but that they may not take affect for weeks. He recommended addition of CCB, as it may help relax the smooth muscle of the esophagus. The patient has declined feeding tube. [name] also recommended that the patient have only liquids and to sit up at 90 degrees for all PO intake. I  greatly appreciate Dr. Steve Rattler help.  Elevated Troponin: Likely due to demand ischemia secondary to above.  Patient had left heart cath in Sept 2020 that show non-obstructive CAD. As per cardiology.  Hypothyroidism: Thyroid studies are within normal limits.  Dyslipidemia: Noted, chronic, and stable  Anemia: AOCD. Monitor.  Essential hypertension: Somewhat soft bp while in a.fib w RVR. Home antihypertensives were held.  GERD:  chronic stable  Prolonged QT interval: Will monitor on telemetry. Avoid QT prolonging medications. Maintain K>4, Mg>2.  Hx of CHF:  Systolic CHF hold Entresto for tonight given soft BP pressures. Patient appears to be on a dry side will gently rehydrate. Monitor volume status closely.  Chronic respiratory failure with hypoxia [facility]): Currently at baseline continue to monitor continue home oxygen. Given increase dyspnea and tachycardia will check d.dimer.  Asthma: Not acutely exacerbated.  patient states she never smoked. Continue home medications currently stable.  I have seen and examined this patient myself. I have spent 48 minutes in her evaluation and care.  DVT prophylaxis: heparin Code Status:  Code Status: Full Code  Family Communication: none at bedside Disposition Plan: pending clinical improvement,  likely home (vs SNF based on PT recs). Est 2-3 days.  Campbell Kray, DO [facility] Direct contact: see [email or web address]  7PM-7AM contact night coverage as above 08/20/2019, 5:23 PM  LOS: 1 day

## 2019-08-20 NOTE — Progress Notes (Signed)
PT Cancellation Note  Patient Details Name: Julie Rice MRN: [id number] DOB: 1936-08-26   Cancelled Treatment:    Reason Eval/Treat Not Completed: Other (comment) RN requesting treatment be deferred. Pt with N/V this am, and overall feeling very poorly. Will check back as time allows.  Benjiman Core, PTA Pager [phone] Acute Rehab   Allena Katz 08/20/2019, 10:59 AM

## 2019-08-20 NOTE — Progress Notes (Signed)
Physical Therapy Treatment Patient Details Name: Julie Rice MRN: [id number] DOB: [birth year] Today's Date: 08/20/2019    History of Present Illness 83 yo female with onset of HR up to 140 at home was admitted, now referred to PT for mobility ck.  Recent admit for PNA, susp aspiration.  Pt now returns and note chest pain chronically now, cleared for PE, covid (-).  PMHx:  COPD, CAD, cardiomegaly, RA, TIA, CHF, HTN, bipolar, atherosclerosis    PT Comments    Pt is progressing towards goals. On arrival to room, pt required gown and mesh underwear change due to leaking purwik. Pt required min A to change underwear and performed peri care independently. She was able to progress into hallway with RW for stability, on 2 L O2. SpO2 and HR remained stable throughout mobility. Will continue to follow acutely.    Follow Up Recommendations  Home health PT;Supervision for mobility/OOB     Equipment Recommendations  None recommended by PT    Recommendations for Other Services       Precautions / Restrictions Precautions Precautions: Fall Precaution Comments: incontinent of urine Restrictions Weight Bearing Restrictions: No    Mobility  Bed Mobility Overal bed mobility: Modified Independent             General bed mobility comments: HOB elevated  Transfers Overall transfer level: Needs assistance Equipment used: Rolling walker (2 wheeled) Transfers: Sit to/from Stand Sit to Stand: Supervision         General transfer comment: supervision for safety to rise from EOB.  Ambulation/Gait Ambulation/Gait assistance: Min guard Gait Distance (Feet): 125 Feet Assistive device: Rolling walker (2 wheeled) Gait Pattern/deviations: Step-through pattern;Decreased stride length Gait velocity: slowed   General Gait Details: Pt on 2 L O2 throughout ambulation. VSS. Min guard for safety as pt reported feeling light headed throughout ambulation. Cues for postural control and RW  proximity.   Stairs             Wheelchair Mobility    Modified Rankin (Stroke Patients Only)       Balance Overall balance assessment: Needs assistance Sitting-balance support: Feet supported Sitting balance-Leahy Scale: Good Sitting balance - Comments: Indep to don shoes sitting EOB   Standing balance support: During functional activity;No upper extremity supported Standing balance-Leahy Scale: Fair Standing balance comment: able to doff mesh underware in standing                            Cognition Arousal/Alertness: Awake/alert Behavior During Therapy: WFL for tasks assessed/performed Overall Cognitive Status: Within Functional Limits for tasks assessed                                        Exercises      General Comments        Pertinent Vitals/Pain Pain Assessment: No/denies pain    Home Living                      Prior Function            PT Goals (current goals can now be found in the care plan section) Acute Rehab PT Goals Patient Stated Goal: feel less pain, restart home therapy PT Goal Formulation: With patient Time For Goal Achievement: 08/31/19 Potential to Achieve Goals: Good Progress towards PT goals: Progressing toward goals  Frequency    Min 3X/week      PT Plan Current plan remains appropriate    Co-evaluation              AM-PAC PT "6 Clicks" Mobility   Outcome Measure  Help needed turning from your back to your side while in a flat bed without using bedrails?: None Help needed moving from lying on your back to sitting on the side of a flat bed without using bedrails?: None Help needed moving to and from a bed to a chair (including a wheelchair)?: A Little Help needed standing up from a chair using your arms (e.g., wheelchair or bedside chair)?: A Little Help needed to walk in hospital room?: A Little Help needed climbing 3-5 steps with a railing? : A Lot 6 Click Score:  19    End of Session Equipment Utilized During Treatment: Oxygen;Gait belt Activity Tolerance: Patient tolerated treatment well Patient left: with call bell/phone within reach;in chair;with nursing/sitter in room Nurse Communication: Mobility status PT Visit Diagnosis: Other abnormalities of gait and mobility (R26.89)     Time: 1497-0263 PT Time Calculation (min) (ACUTE ONLY): 25 min  Charges:  $Gait Training: 8-22 mins $Therapeutic Activity: 8-22 mins                     Kallie Locks, [place] Pager [phone] Acute Rehab   Sheral Apley 08/20/2019, 12:47 PM

## 2019-08-20 NOTE — Progress Notes (Addendum)
Progress Note    ASSESSMENT AND PLAN:   7. 83 yo female with GERD, large hiatal hernia s/p Nissen fundoplication and hernia repair in 2018. She has known esophageal dysmotility with chronic dysphagia, regurgitation and intermittent vomiting. She had an EGD with dilation and Botox to distal esophagus on 08/12/19. Admitted to hospital 08/18/19 with chest pain.. We saw her in consult 2 days ago  Felt like chest pain was secondary to AFIB. She was going to have cardioversion but converted spontaneously. Not having same chest pain but rather severe heartburn. See #2.    2. Nausea / vomiting. We were asked to see her for nausea / vomiting. She has poor clearance of esophageal contents which can lead to regurgitation which can be hard to differentiate from actual nausea / vomiting. She is heaving in the room, nausea is constant so this seems like true nausea / vomiting rather than regurgitation. She is getting Dilaudid and Norco which may be contributing.  -limit narcotics if possible.  -Prolonged QT interval limiting medication options ( PPI, anti-emetics)  -Change Carafate to a slurry form -Continue daily PPI. Would like to increase to BID if Cardiology okay with it -Would like to change to scheduled dosing if Cardiology okay with it.   SUBJECTIVE   Nausea and vomiting for two days. Eating very little per RN. No abdominal pain   OBJECTIVE:     Vital signs in last 24 hours: Temp:  [98.1 F (36.7 C)-98.5 F (36.9 C)] 98.1 F (36.7 C) (10/16 0410) Pulse Rate:  [59-73] 59 (10/16 1237) Resp:  [16-20] 20 (10/16 0410) BP: (128-158)/(58-65) 150/61 (10/16 1237) SpO2:  [95 %-100 %] 100 % (10/16 1237) Weight:  [65.3 kg] 65.3 kg (10/16 0410) Last BM Date: 08/19/19 General:   Alert, well-developed female in NAD EENT:  Normal hearing, non icteric sclera, conjunctive pink.  Heart:  Regular rate ;  No lower extremity edema   Pulm: Normal respiratory effort Abdomen:  Soft, nondistended,  nontender.  Normal bowel sounds.          Neurologic:  Alert and  oriented x4;  grossly normal neurologically. Psych:  Pleasant, cooperative.  Normal mood and affect.   Intake/Output from previous day: 10/15 0701 - 10/16 0700 In: 400 [I.V.:400] Out: -  Intake/Output this shift: Total I/O In: -  Out: 700 [Urine:700]  Lab Results: Recent Labs    08/18/19 0314 08/19/19 0429  WBC 7.9 8.1  HGB 8.6* 8.8*  HCT 28.6* 28.6*  PLT 267 215   BMET Recent Labs    08/18/19 0314 08/19/19 0429 08/20/19 0238  NA 142 139 138  K 4.9 3.5 3.5  CL 105 102 100  CO2 29 25 23   GLUCOSE 99 101* 115*  BUN 6* 7* 11  CREATININE 0.78 0.94 0.91  CALCIUM 8.0* 8.4* 8.5*  No results found.  Principal Problem:   Atrial fibrillation with RVR (HCC) Active Problems:   Hypothyroidism   Dyslipidemia   UNSPECIFIED ANEMIA   Essential hypertension   ESOPHAGEAL STRICTURE   Hiatal hernia with gastroesophageal reflux   Esophageal dysmotility   Asthma   Chest pain   CHF (congestive heart failure) (HCC)   Prolonged QT interval   Acute on chronic respiratory failure with hypoxia (HCC)   Chronic respiratory failure with hypoxia (HCC)   COPD (chronic obstructive pulmonary disease) (HCC)   Elevated troponin     LOS: 3 days   Tye Savoy ,NP 08/20/2019, 12:58 PM

## 2019-08-20 NOTE — Progress Notes (Signed)
Occupational Therapy Treatment Patient Details Name: Julie Rice MRN: [id number] DOB: 09/19/1936 Today's Date: 08/20/2019    History of present illness 83 yo female with onset of HR up to 140 at home was admitted, now referred to PT for mobility ck.  Recent admit for PNA, susp aspiration.  Pt now returns and note chest pain chronically now, cleared for PE, covid (-).  PMHx:  COPD, CAD, cardiomegaly, RA, TIA, CHF, HTN, bipolar, atherosclerosis   OT comments  Pt limited by nausea this session but agreeable to OT session. Supervision/ set- up for LB dressing at EOB. Min guard for functional mobility from EOB>recliner with RW. Pt on 2L O2 throughout session with O2 sats remaining WNL.  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 DC plan remains appropriate. Will continue to follow acutely for OT needs.   Follow Up Recommendations  Home health OT;Supervision/Assistance - 24 hour    Equipment Recommendations  None recommended by OT;Other (comment)(reports has DME from when she took care of her mother; encouraged her to have sister in law get items out of storage for her to use)    Recommendations for Other Services      Precautions / Restrictions Precautions Precautions: Fall Precaution Comments: incontinent of urine Restrictions Weight Bearing Restrictions: No       Mobility Bed Mobility Overal bed mobility: Modified Independent Bed Mobility: Supine to Sit     Supine to sit: Modified independent (Device/Increase time)     General bed mobility comments: HOB elevated; use of bed features  Transfers Overall transfer level: Needs assistance Equipment used: Rolling walker (2 wheeled) Transfers: Sit to/from Stand Sit to Stand: Supervision         General transfer comment: supervision for safety to rise from EOB.    Balance Overall balance assessment: Needs  assistance Sitting-balance support: Feet supported Sitting balance-Leahy Scale: Good Sitting balance - Comments: Indep to don shoes sitting EOB   Standing balance support: Bilateral upper extremity supported Standing balance-Leahy Scale: Poor Standing balance comment: able to doff mesh underware in standing                           ADL either performed or assessed with clinical judgement   ADL Overall ADL's : Needs assistance/impaired                     Lower Body Dressing: Set up;Supervision/safety;Sit to/from stand Lower Body Dressing Details (indicate cue type and reason): supervision/ set- up to don shoes at [employer] Transfer: Min guard;Ambulation;RW Toilet Transfer Details (indicate cue type and reason): simulated from EOB>recliner         Functional mobility during ADLs: Min guard;Rolling walker General ADL Comments: simulated toilet transfer from EOB>Recliner; limited by nausea this session     Vision Baseline Vision/History: No visual deficits     Perception     Praxis      Cognition Arousal/Alertness: Awake/alert Behavior During Therapy: WFL for tasks assessed/performed Overall Cognitive Status: Within Functional Limits for tasks assessed                                          Exercises     Shoulder Instructions  General Comments SaO2/ HR WNL during session    Pertinent Vitals/ Pain       Pain Assessment: No/denies pain(Nausea)  Home Living                                          Prior Functioning/Environment              Frequency  Min 2X/week        Progress Toward Goals  OT Goals(current goals can now be found in the care plan section)  Progress towards OT goals: Progressing toward goals  Acute Rehab OT Goals Patient Stated Goal: feel less pain, restart home therapy OT Goal Formulation: With patient Time For Goal Achievement: 09/01/19 Potential to Achieve Goals:  Good  Plan Discharge plan remains appropriate;Frequency remains appropriate    Co-evaluation                 AM-PAC OT "6 Clicks" Daily Activity     Outcome Measure   Help from another person eating meals?: None Help from another person taking care of personal grooming?: None Help from another person toileting, which includes using toliet, bedpan, or urinal?: A Little Help from another person bathing (including washing, rinsing, drying)?: A Little Help from another person to put on and taking off regular upper body clothing?: A Little Help from another person to put on and taking off regular lower body clothing?: A Little 6 Click Score: 20    End of Session Equipment Utilized During Treatment: Rolling walker;Oxygen  OT Visit Diagnosis: Unsteadiness on feet (R26.81);Muscle weakness (generalized) (M62.81);Pain   Activity Tolerance Patient tolerated treatment well;Other (comment)(limited by nausea)   Patient Left in chair;with call bell/phone within reach   Nurse Communication Mobility status;Other (comment)(need to hook up pure wic)        Time: 4481-8563 OT Time Calculation (min): 17 min  Charges: OT General Charges $OT Visit: 1 Visit OT Treatments $Therapeutic Activity: 8-22 mins  [place], COTA/L Acute Rehabilitation Services [phone] [place] 08/20/2019, 3:11 PM

## 2019-08-21 DIAGNOSIS — R112 Nausea with vomiting, unspecified: Secondary | ICD-10-CM

## 2019-08-21 DIAGNOSIS — I429 Cardiomyopathy, unspecified: Secondary | ICD-10-CM

## 2019-08-21 LAB — MAGNESIUM: Magnesium: 1.8 mg/dL (ref 1.7–2.4)

## 2019-08-21 LAB — BASIC METABOLIC PANEL
Anion gap: 13 (ref 5–15)
BUN: 10 mg/dL (ref 8–23)
CO2: 28 mmol/L (ref 22–32)
Calcium: 8.8 mg/dL — ABNORMAL LOW (ref 8.9–10.3)
Chloride: 100 mmol/L (ref 98–111)
Creatinine, Ser: 0.72 mg/dL (ref 0.44–1.00)
GFR calc Af Amer: >60 mL/min (ref >60)
GFR calc non Af Amer: >60 mL/min (ref >60)
Glucose, Bld: 93 mg/dL (ref 70–99)
Potassium: 3.8 mmol/L (ref 3.5–5.1)
Sodium: 141 mmol/L (ref 135–145)

## 2019-08-21 MED ORDER — CLONAZEPAM 0.5 MG PO TABS
0.5000 mg | ORAL_TABLET | Freq: Two times a day (BID) | ORAL | Status: DC
  Administered 2019-08-21 – 2019-08-23 (×5): 0.5 mg via ORAL
  Filled 2019-08-21 (×5): qty 1

## 2019-08-21 MED ORDER — AMIODARONE HCL 200 MG PO TABS
200.0000 mg | ORAL_TABLET | Freq: Two times a day (BID) | ORAL | Status: DC
  Administered 2019-08-21 – 2019-08-23 (×4): 200 mg via ORAL
  Filled 2019-08-21 (×4): qty 1

## 2019-08-21 MED ORDER — SACUBITRIL-VALSARTAN 24-26 MG PO TABS
1.0000 | ORAL_TABLET | Freq: Two times a day (BID) | ORAL | Status: DC
  Administered 2019-08-21 – 2019-08-23 (×4): 1 via ORAL
  Filled 2019-08-21 (×4): qty 1

## 2019-08-21 NOTE — Progress Notes (Signed)
PROGRESS NOTE  Julie Rice DXA:[id number] DOB: 22-Nov-1935 DOA: 08/16/2019 PCP: Julie Frees, NP  Brief History   83 y.o.femalewith medical history significant of hypertension, hyperlipidemia, systolic CHF, fibromyalgia, anemia, esophageal stricture s/p balloon dilation, large symptomatic hiatal hernia status post fundoplication, chronic dysphagia, asthma/COPD on oxygen at 2 L at home, CAD, TIA, bipolar disorderfibromyalgia, rheumatoid arthritis, TIA, chronic urinary incontinence.    She presented with heart rate 60s-140at home per home health nurse.  Patient has not filled her medications since her discharge with still having some intermittent chest pain and she was told to come to emergency department.  She reports while hospitalized she has an episode of tachycardia with HR p to 170's she was given a medicine and then her HR has come down. She has no known hx of A.fib  She took 2 nitro and 3/24 of aspirin and presented to emergency department.  Chest pain report on and off for weeks, is worse with swallowing.  Has been swallowing soft food ok.  Reported burning pain in her chest radiating to her back and arms.  She felt very weak and fell back wards no LOC.  No fever no chills.  Last night she felt sweaty her temp was 99.9.  Reports Shortness of breath since last summer but has been getting progressively worse.    Relative recent history: Initially admitted on 10/3/20200with shortness of breath and chest pain was found to have lower lobe pneumonia thought to be secondary to aspiration COVID negative treated with broad-spectrum antibiotics there was worrisome for esophageal stenosis GI was consulted and she underwent balloon dilatation and Botox injection Physical therapy seen her and recommended home health and discharge and she was discharged home on 9 October.  EKG showed no evidence of ischemic changes thought to be secondary to demand ischemia.  Consultants   Cardiology  EP  cardiology  Gastroenterology  Procedures   None  Antibiotics   Anti-infectives (From admission, onward)   None     Subjective  The patient states that she is feeling a little better. She has also had some VT and has continued to have prolonged QTc.  Objective   Vitals:  Vitals:   08/21/19 1345 08/21/19 1414  BP:  (!) 133/57  Pulse: 63 63  Resp:    Temp:  97.9 F (36.6 C)  SpO2: 100% 98%   Exam:  Constitutional:   The patient is awake, alert, and oriented x 3. Mild distress from nausea. Respiratory:   No increased work of breathing.  No wheezes, rales, or rhonchi  No tactile fremitus Cardiovascular:   Regular rate and rhythm  No murmurs, ectopy, or gallups.  No lateral PMI. No thrills. Abdomen:   Abdomen is soft, non-tender, non-distended  No hernias, masses, or organomegaly  Normoactive bowel sounds.  Musculoskeletal:   No cyanosis, clubbing, or edema Skin:   No rashes, lesions, ulcers  palpation of skin: no induration or nodules Neurologic:   CN 2-12 intact  Sensation all 4 extremities intact Psychiatric:   Mental status o Mood, affect appropriate o Orientation to person, place, time   judgment and insight appear intact  I have personally reviewed the following:   Today's Data   Vitals, BMP  Imaging   CTA chest  Scheduled Meds:  amiodarone  200 mg Oral BID   apixaban  5 mg Oral BID   clonazePAM  0.5 mg Oral BID   gabapentin  100 mg Oral TID   metoprolol tartrate  10 mg Intravenous  Q6H   pantoprazole (PROTONIX) IV  40 mg Intravenous Q24H   sacubitril-valsartan  1 tablet Oral BID   sucralfate  1 g Oral Q6H   Continuous Infusions:   Principal Problem:   Atrial fibrillation with RVR (HCC) Active Problems:   Hypothyroidism   Dyslipidemia   UNSPECIFIED ANEMIA   Essential hypertension   ESOPHAGEAL STRICTURE   Hiatal hernia with gastroesophageal reflux   Esophageal dysmotility   Asthma   Chest pain   CHF  (congestive heart failure) (HCC)   Prolonged QT interval   Acute on chronic respiratory failure with hypoxia (HCC)   Chronic respiratory failure with hypoxia (HCC)   COPD (chronic obstructive pulmonary disease) (HCC)   Elevated troponin   Secondary cardiomyopathy ([place]) Esophageal dilatation with delayed transit of food  LOS: 4 days   A & P  Atrial fibrillation with RVR: New onset. CHA2D-VASC score 5.  TSH within normal limits (free T4 slightly up).  Echo 10/12 EF 45-50%, severe LAE, mild/mod MR, moderaly dilated RA.  Appreciate EP's assistance. No intervention planned until GI has addressed esophageal issue. She will be continued on the heparin and cardizem drip for now. Thyroid studies are within normal limits. Cardiology has been consulted as has EP.  Pt was taken down for DCCV, but she has converted to sinus rhythm. It is thought that her chest pain is due to atrial fibrillation.  Chest pain:  Patient had 10/10 pain radiation to the back.  Was concerned for complication of the dilation vs aortic dissection.  Obtained CTA which ruled out both but did show a dilated and fluid filled esophagus (stable from prior).  Had recentcardiac catheterization which  is reassuring.  GI has been consulted, but they state that they feel that pain is due to atrial fibrillation. The patient was taken down for DCCV by cardiology, but she converted to sinus first.  The patient is receiving Carafate as at home and low dose Dilaudid PRN.   Intractable nausea and vomiting: Due to stenosis of distal esophagus and partial obstruction by large hiatal hernia. I have discussed the patient with [name], as he performed the dilatation on 08/12/2019. He stated that the patient had botox injections at the site, but that they may not take affect for weeks. He recommended addition of CCB, as it may help relax the smooth muscle of the esophagus. The patient has declined feeding tube. [name] also recommended that the patient  have only liquids and to sit up at 90 degrees for all PO intake. I greatly appreciate Dr. Steve Rattler help.  Elevated Troponin: Likely due to demand ischemia secondary to above.  Patient had left heart cath in Sept 2020 that show non-obstructive CAD. As per cardiology.  Hypothyroidism: Thyroid studies are within normal limits.  Dyslipidemia: Noted, chronic, and stable  Anemia: AOCD. Monitor.  Essential hypertension: Controlled blood pressure on  metoprolol, and entresto.  GERD:  chronic stable  Prolonged QT interval: Will monitor on telemetry. Avoid QT prolonging medications. Maintain K>4, Mg>2.  Hx of CHF:  Systolic CHF hold Entresto for tonight given soft BP pressures. Patient appears to be on a dry side will gently rehydrate. Monitor volume status closely.  Chronic respiratory failure with hypoxia [facility]): Currently at baseline continue to monitor continue home oxygen. Given increase dyspnea and tachycardia will check d.dimer.  Asthma: Not acutely exacerbated.  patient states she never smoked. Continue home medications currently stable.  I have seen and examined this patient myself. I have spent 32 minutes  in her evaluation and care.  DVT prophylaxis: heparin Code Status:   Code Status: Full Code  Family Communication: none at bedside Disposition Plan: pending clinical improvement,  likely home (vs SNF based on PT recs). Est 2-3 days.  Wyonia Fontanella, DO [facility] Direct contact: see [email or web address]  7PM-7AM contact night coverage as above 08/21/2019, 5:45 PM  LOS: 1 day

## 2019-08-21 NOTE — Progress Notes (Addendum)
Pt had approx 1 minute run of VT. C/O SOB and Chest Pressure. QTc prolonged. Cecilie Kicks NP notified. Order received for stat EKG.

## 2019-08-21 NOTE — Progress Notes (Addendum)
     Progress Note    ASSESSMENT AND PLAN:   29. 83 yo female with GERD, large hiatal hernia s/p Nissen fundoplication and hernia repair in 2018. She has known esophageal dysmotility with chronic dysphagia, regurgitation and intermittent vomiting. She had an EGD with dilation and Botox to distal esophagus on 08/12/19. Admitted to hospital 08/18/19 with chest pain felt to be secondary to AFIB. Spontaneously converted, now on Eliquis and Amiodarone.  2. Nausea / vomiting. She has poor clearance of esophageal contents which can lead to regurgitation and that can be hard to differentiate from actual nausea / vomiting. She complains of true nausea with vomiting which may be multifactorial. Just started Amiodarone with could be contributing. More likely etiology is that she hasn't been receiving home anxiolytic.  -Resuming home Klonipin.  -continue Zofran prn. Cannot be given as scheduled dosing due to worsening prolongation of QT -Continue daily PPI -Continue carafate slurry -continue anti-reflux precautions.  -consider maintenance IVF as she is not taking much PO and also vomiting       SUBJECTIVE   Nausea and vomiting during the night and again this am. Carafate helping chest burning.   OBJECTIVE:     Vital signs in last 24 hours: Temp:  [98 F (36.7 C)-98.3 F (36.8 C)] 98.3 F (36.8 C) (10/17 0438) Pulse Rate:  [59-66] 65 (10/17 0438) Resp:  [19-20] 20 (10/17 0438) BP: (150-165)/(61-75) 160/61 (10/17 0438) SpO2:  [100 %] 100 % (10/17 0438) Weight:  [64.6 kg] 64.6 kg (10/17 0443) Last BM Date: 08/19/19 General:   Alert, well-developed female in NAD EENT:  Normal hearing, non icteric sclera, conjunctive pink.  Heart:  Regular rate  Pulm: Normal respiratory effort Abdomen:  Soft, nondistended, nontender.  Normal bowel sounds.          Neurologic:  Alert and  oriented x4;  grossly normal neurologically. Psych:  Pleasant, cooperative.  Normal mood and affect.    Intake/Output from previous day: 10/16 0701 - 10/17 0700 In: 857.1 [P.O.:540; IV Piggyback:317.1] Out: 975 [Urine:975] Intake/Output this shift: No intake/output data recorded.  Lab Results: Recent Labs    08/19/19 0429  WBC 8.1  HGB 8.8*  HCT 28.6*  PLT 215   BMET Recent Labs    08/19/19 0429 08/20/19 0238 08/21/19 0407  NA 139 138 141  K 3.5 3.5 3.8  CL 102 100 100  CO2 25 23 28   GLUCOSE 101* 115* 93  BUN 7* 11 10  CREATININE 0.94 0.91 0.72  CALCIUM 8.4* 8.5* 8.8*    Principal Problem:   Atrial fibrillation with RVR (HCC) Active Problems:   Hypothyroidism   Dyslipidemia   UNSPECIFIED ANEMIA   Essential hypertension   ESOPHAGEAL STRICTURE   Hiatal hernia with gastroesophageal reflux   Esophageal dysmotility   Asthma   Chest pain   CHF (congestive heart failure) (HCC)   Prolonged QT interval   Acute on chronic respiratory failure with hypoxia (HCC)   Chronic respiratory failure with hypoxia (HCC)   COPD (chronic obstructive pulmonary disease) (HCC)   Elevated troponin     LOS: 4 days   Tye Savoy ,NP 08/21/2019, 11:19 AM

## 2019-08-21 NOTE — Progress Notes (Signed)
Progress Note  Patient Name: Julie Rice Date of Encounter: 08/21/2019  Primary Cardiologist: Charlton Haws, MD  Subjective   No chest pain or breathlessness this morning.  Less nausea, no recent emesis.  She had a small amount to eat today.  Inpatient Medications    Scheduled Meds: . amiodarone  400 mg Oral BID  . amLODipine  10 mg Oral Daily  . apixaban  5 mg Oral BID  . clonazePAM  0.5 mg Oral BID  . gabapentin  100 mg Oral TID  . metoprolol tartrate  10 mg Intravenous Q6H  . ondansetron  4 mg Intravenous Q6H  . pantoprazole (PROTONIX) IV  40 mg Intravenous Q24H  . sucralfate  1 g Oral Q6H   PRN Meds: acetaminophen **OR** acetaminophen, albuterol, alum & mag hydroxide-simeth, HYDROcodone-acetaminophen, HYDROmorphone (DILAUDID) injection, ondansetron (ZOFRAN) IV   Vital Signs    Vitals:   08/20/19 1700 08/20/19 2050 08/21/19 0438 08/21/19 0443  BP:  (!) 165/64 (!) 160/61   Pulse: 63 66 65   Resp:  20 20   Temp:  98 F (36.7 C) 98.3 F (36.8 C)   TempSrc:  Oral Oral   SpO2: 100% 100% 100%   Weight:    64.6 kg  Height:        Intake/Output Summary (Last 24 hours) at 08/21/2019 1218 Last data filed at 08/20/2019 1910 Gross per 24 hour  Intake 677.11 ml  Output 275 ml  Net 402.11 ml   Filed Weights   08/19/19 0717 08/20/19 0410 08/21/19 0443  Weight: 65.3 kg 65.3 kg 64.6 kg    Telemetry    Sinus rhythm.  Episode of VT noted.  Personally reviewed.  ECG    An ECG dated 08/21/2019 was personally reviewed today and demonstrated:  Sinus rhythm with decreased R wave progression, anterolateral T wave inversions, prolonged QTc 535 ms.  Physical Exam   GEN: No acute distress.   Neck: No JVD. Cardiac: RRR, no murmur, rub, or gallop.  Respiratory: Nonlabored.  Scattered rhonchi. GI: Soft, mildly tender, bowel sounds present. MS: No edema; No deformity. Neuro:  Nonfocal. Psych: Alert and oriented x 3. Normal affect.  Labs    Chemistry Recent Labs   Lab 08/17/19 0400  08/19/19 0429 08/20/19 0238 08/21/19 0407  NA 141   < > 139 138 141  K 3.8   < > 3.5 3.5 3.8  CL 103   < > 102 100 100  CO2 25   < > 25 23 28   GLUCOSE 96   < > 101* 115* 93  BUN 8   < > 7* 11 10  CREATININE 0.68   < > 0.94 0.91 0.72  CALCIUM 8.3*   < > 8.4* 8.5* 8.8*  PROT 5.7*  --   --   --   --   ALBUMIN 2.9*  --   --   --   --   AST 18  --   --   --   --   ALT 17  --   --   --   --   ALKPHOS 62  --   --   --   --   BILITOT 0.6  --   --   --   --   GFRNONAA >60   < > 57* 59* >60  GFRAA >60   < > >60 >60 >60  ANIONGAP 13   < > 12 15 13    < > = values in this  interval not displayed.     Hematology Recent Labs  Lab 08/17/19 0400 08/18/19 0314 08/19/19 0429  WBC 9.3 7.9 8.1  RBC 3.23*  3.23* 3.10* 3.12*  HGB 9.3* 8.6* 8.8*  HCT 30.0* 28.6* 28.6*  MCV 92.9 92.3 91.7  MCH 28.8 27.7 28.2  MCHC 31.0 30.1 30.8  RDW 15.8* 15.9* 16.2*  PLT 303 267 215    Cardiac Enzymes Recent Labs  Lab 08/11/19 0049 08/16/19 2035 08/16/19 2212 08/17/19 0030 08/17/19 0223  TROPONINIHS 20* 35* 32* 32* 30*    BNP Recent Labs  Lab 08/17/19 2020  BNP 599.2*     DDimer Recent Labs  Lab 08/17/19 0030  DDIMER 1.85*     Radiology    No results found.  Cardiac Studies   TEE 08/19/2019:  1. Left ventricular ejection fraction, by visual estimation, is 25 to 30%. The left ventricle has severely decreased function. There is no left ventricular hypertrophy.  2. Global right ventricle has normal systolic function.The right ventricular size is normal.  3. Left atrial size was severely dilated.  4. Right atrial size was moderately dilated.  5. Mild mitral annular calcification.  6. The mitral valve is normal in structure. Moderate mitral valve regurgitation.  7. The tricuspid valve is normal in structure. Tricuspid valve regurgitation moderate.  8. The aortic valve is tricuspid Aortic valve regurgitation is mild by color flow Doppler. Mild aortic valve  sclerosis without stenosis.  9. The pulmonic valve was grossly normal. Pulmonic valve regurgitation is mild by color flow Doppler. 10. Mild plaque invoving the descending aorta. 11. Severe global reduction in LV systolic function; biatrial enlargement; atrial septal aneurysm; no LAA thrombus; mild AI; moderate MR and TR.  Cardiac catheterization 07/26/2019:  Right dominant coronary anatomy  Widely patent left main  30 to 40% mid LAD  Widely patent circumflex  Luminal irregularities in a dominant right coronary.  Right heart pressures are relatively low.  Pulmonary wedge pressure mean is 2 mmHg.  LVEDP 4 mmHg.  Left ventriculography by hand-injection was not helpful.  From images obtained, LV function appears relatively normal.  Patient Profile     83 y.o. female with a history of systolic CHF, prolonged QT, hypertension, hyperlipidemia, fibromyalgia, esophageal stricture,large symptomatic hiatal hernia s/p fundoplication, chronic dysphagia, asthma/COPD 2L O2 at baseline, CAD, TIA, bipolar, RAwho is being seen today for the evaluation of chest pain and new onset afib RVRas well as mild acute heart failure.  Assessment & Plan    1.  New onset atrial fibrillation with spontaneous conversion to sinus rhythm.  CHADSVASC score is 7.  She continues on amiodarone load along with Eliquis and a beta-blocker.  2.  VT by monitor in the setting of prolonged QTc, 535 ms this morning.  At her own, but also Zofran recently on a regular basis.  3.  Nonobstructive CAD by cardiac catheterization on September 21.  4.  Acute on chronic systolic heart failure.  LVEF 25 to 30% by recent assessment.  Medical therapy somewhat fragmented by recent n.p.o. status but she is beginning to take p.o.'s now.  I reviewed the chart.  She is now tolerating p.o.'s will start to try and stabilize cardiac regimen.  Continue IV Lopressor in divided dose for now.  Initiate Entresto 24/26 mg twice daily, discontinue  Norvasc.  Continue oral amiodarone and Eliquis.  Will eventually add Aldactone and transition from IV Lopressor to either Coreg or bisoprolol.  We will try and wean down and ideally discontinue use  of Zofran given prolonged QT, amiodarone dose will also be reduced.  Signed, Nona Dell, MD  08/21/2019, 12:18 PM

## 2019-08-22 ENCOUNTER — Encounter (HOSPITAL_COMMUNITY): Payer: Self-pay | Admitting: Cardiology

## 2019-08-22 MED ORDER — CARVEDILOL 6.25 MG PO TABS
6.2500 mg | ORAL_TABLET | Freq: Two times a day (BID) | ORAL | Status: DC
  Administered 2019-08-22 – 2019-08-23 (×4): 6.25 mg via ORAL
  Filled 2019-08-22 (×4): qty 1

## 2019-08-22 MED ORDER — SPIRONOLACTONE 12.5 MG HALF TABLET
12.5000 mg | ORAL_TABLET | Freq: Every day | ORAL | Status: DC
  Administered 2019-08-22 – 2019-08-23 (×2): 12.5 mg via ORAL
  Filled 2019-08-22 (×2): qty 1

## 2019-08-22 NOTE — Progress Notes (Signed)
Progress Note  Patient Name: Julie Rice Date of Encounter: 08/22/2019  Primary Cardiologist: Charlton Haws, MD  Subjective   States that she tolerated meals yesterday, felt nauseous this morning and had an episode of emesis.  No chest pain or palpitations.  Inpatient Medications    Scheduled Meds:  amiodarone  200 mg Oral BID   apixaban  5 mg Oral BID   clonazePAM  0.5 mg Oral BID   gabapentin  100 mg Oral TID   metoprolol tartrate  10 mg Intravenous Q6H   pantoprazole (PROTONIX) IV  40 mg Intravenous Q24H   sacubitril-valsartan  1 tablet Oral BID   sucralfate  1 g Oral Q6H   PRN Meds: acetaminophen **OR** acetaminophen, albuterol, alum & mag hydroxide-simeth, HYDROcodone-acetaminophen, HYDROmorphone (DILAUDID) injection, ondansetron (ZOFRAN) IV   Vital Signs    Vitals:   08/21/19 1414 08/21/19 2218 08/22/19 0048 08/22/19 0559  BP: (!) 133/57 (!) 138/56 (!) 156/63 (!) 151/109  Pulse: 63 (!) 58 (!) 56   Resp:  16 17   Temp: 97.9 F (36.6 C) 98 F (36.7 C) 97.9 F (36.6 C) 98.2 F (36.8 C)  TempSrc: Oral Oral Oral Oral  SpO2: 98% 99%    Weight:    64.7 kg  Height:        Intake/Output Summary (Last 24 hours) at 08/22/2019 0756 Last data filed at 08/22/2019 0646 Gross per 24 hour  Intake 240 ml  Output 250 ml  Net -10 ml   Filed Weights   08/20/19 0410 08/21/19 0443 08/22/19 0559  Weight: 65.3 kg 64.6 kg 64.7 kg    Telemetry    Sinus bradycardia with brief NSVT.  Personally reviewed.  ECG    Tracing from 08/22/2019 shows sinus rhythm with anterolateral T wave inversions and prolonged QTc of 535 ms.  Physical Exam   GEN: No acute distress.   Neck: No JVD. Cardiac:  Regular rate and rhythm, no gallop or rub.  Respiratory: Nonlabored.  Scattered rhonchi. GI: Soft, bowel sounds present. MS: No pitting edema; No deformity. Neuro:  Nonfocal. Psych: Alert and oriented x 3. Normal affect.  Labs    Chemistry Recent Labs  Lab  08/17/19 0400  08/19/19 0429 08/20/19 0238 08/21/19 0407  NA 141   < > 139 138 141  K 3.8   < > 3.5 3.5 3.8  CL 103   < > 102 100 100  CO2 25   < > 25 23 28   GLUCOSE 96   < > 101* 115* 93  BUN 8   < > 7* 11 10  CREATININE 0.68   < > 0.94 0.91 0.72  CALCIUM 8.3*   < > 8.4* 8.5* 8.8*  PROT 5.7*  --   --   --   --   ALBUMIN 2.9*  --   --   --   --   AST 18  --   --   --   --   ALT 17  --   --   --   --   ALKPHOS 62  --   --   --   --   BILITOT 0.6  --   --   --   --   GFRNONAA >60   < > 57* 59* >60  GFRAA >60   < > >60 >60 >60  ANIONGAP 13   < > 12 15 13    < > = values in this interval not displayed.  Hematology Recent Labs  Lab 08/17/19 0400 08/18/19 0314 08/19/19 0429  WBC 9.3 7.9 8.1  RBC 3.23*   3.23* 3.10* 3.12*  HGB 9.3* 8.6* 8.8*  HCT 30.0* 28.6* 28.6*  MCV 92.9 92.3 91.7  MCH 28.8 27.7 28.2  MCHC 31.0 30.1 30.8  RDW 15.8* 15.9* 16.2*  PLT 303 267 215    Cardiac Enzymes Recent Labs  Lab 08/11/19 0049 08/16/19 2035 08/16/19 2212 08/17/19 0030 08/17/19 0223  TROPONINIHS 20* 35* 32* 32* 30*    BNP Recent Labs  Lab 08/17/19 2020  BNP 599.2*     DDimer Recent Labs  Lab 08/17/19 0030  DDIMER 1.85*     Radiology    No results found.  Cardiac Studies   TEE 08/19/2019:  1. Left ventricular ejection fraction, by visual estimation, is 25 to 30%. The left ventricle has severely decreased function. There is no left ventricular hypertrophy.  2. Global right ventricle has normal systolic function.The right ventricular size is normal.  3. Left atrial size was severely dilated.  4. Right atrial size was moderately dilated.  5. Mild mitral annular calcification.  6. The mitral valve is normal in structure. Moderate mitral valve regurgitation.  7. The tricuspid valve is normal in structure. Tricuspid valve regurgitation moderate.  8. The aortic valve is tricuspid Aortic valve regurgitation is mild by color flow Doppler. Mild aortic valve sclerosis  without stenosis.  9. The pulmonic valve was grossly normal. Pulmonic valve regurgitation is mild by color flow Doppler. 10. Mild plaque invoving the descending aorta. 11. Severe global reduction in LV systolic function; biatrial enlargement; atrial septal aneurysm; no LAA thrombus; mild AI; moderate MR and TR.  Cardiac catheterization 07/26/2019:  Right dominant coronary anatomy  Widely patent left main  30 to 40% mid LAD  Widely patent circumflex  Luminal irregularities in a dominant right coronary.  Right heart pressures are relatively low.  Pulmonary wedge pressure mean is 2 mmHg.  LVEDP 4 mmHg.  Left ventriculography by hand-injection was not helpful.  From images obtained, LV function appears relatively normal.  Patient Profile     83 y.o. female with a history of systolic CHF, prolonged QT, hypertension, hyperlipidemia, fibromyalgia, esophageal stricture,large symptomatic hiatal hernia s/p fundoplication, chronic dysphagia, asthma/COPD 2L O2 at baseline, CAD, TIA, bipolar, RAwho is being seen today for the evaluation of chest pain and new onset afib RVRas well as mild acute heart failure.  Assessment & Plan    1.  New onset atrial fibrillation with spontaneous conversion to sinus rhythm.  CHADSVASC score is 7.  Amiodarone load was reduced yesterday with continuation of Eliquis and IV Lopressor.  2.  Prolonged QTc, stable at 535 ms this morning.  Amiodarone was reduced, also taken off standing Zofran.  3.  Nonobstructive CAD by cardiac catheterization on September 21.  4.  Acute on chronic systolic heart failure.  LVEF 25 to 30% by recent assessment.  Medical therapy is being modified with patient able to take oral medications.  Continue Eliquis, amiodarone at 200 mg twice daily, and Entresto.  Start Aldactone 12.5 mg daily and change from IV Lopressor to Coreg.  Follow-up BMET in a.m.  Signed, Rozann Lesches, MD  08/22/2019, 7:56 AM

## 2019-08-22 NOTE — Progress Notes (Signed)
PROGRESS NOTE  Julie Rice EHM:[id number] DOB: 1936-07-26 DOA: 08/16/2019 PCP: Shirline Frees, NP  Brief History   83 y.o.femalewith medical history significant of hypertension, hyperlipidemia, systolic CHF, fibromyalgia, anemia, esophageal stricture s/p balloon dilation, large symptomatic hiatal hernia status post fundoplication, chronic dysphagia, asthma/COPD on oxygen at 2 L at home, CAD, TIA, bipolar disorderfibromyalgia, rheumatoid arthritis, TIA, chronic urinary incontinence.    She presented with heart rate 60s-140at home per home health nurse.  Patient has not filled her medications since her discharge with still having some intermittent chest pain and she was told to come to emergency department.  She reports while hospitalized she has an episode of tachycardia with HR p to 170's she was given a medicine and then her HR has come down. She has no known hx of A.fib  She took 2 nitro and 3/24 of aspirin and presented to emergency department.  Chest pain report on and off for weeks, is worse with swallowing.  Has been swallowing soft food ok.  Reported burning pain in her chest radiating to her back and arms.  She felt very weak and fell back wards no LOC.  No fever no chills.  Last night she felt sweaty her temp was 99.9.  Reports Shortness of breath since last summer but has been getting progressively worse.    Relative recent history: Initially admitted on 10/3/20200with shortness of breath and chest pain was found to have lower lobe pneumonia thought to be secondary to aspiration COVID negative treated with broad-spectrum antibiotics there was worrisome for esophageal stenosis GI was consulted and she underwent balloon dilatation and Botox injection Physical therapy seen her and recommended home health and discharge and she was discharged home on 9 October.  EKG showed no evidence of ischemic changes thought to be secondary to demand ischemia.  Consultants   Cardiology  EP  cardiology  Gastroenterology  Procedures   None  Antibiotics   Anti-infectives (From admission, onward)   None     Subjective  The patient states that although she did vomit once overnight, she is feeling better. She has eaten about 30% of her breakfast, and states that she has kept it all down.  Objective   Vitals:  Vitals:   08/22/19 0559 08/22/19 1048  BP: (!) 151/109 (!) 130/47  Pulse:  (!) 58  Resp:    Temp: 98.2 F (36.8 C)   SpO2:  98%   Exam:  Constitutional:   The patient is awake, alert, and oriented x 3. No acute distress. Respiratory:   No increased work of breathing.  No wheezes, rales, or rhonchi  No tactile fremitus Cardiovascular:   Regular rate and rhythm  No murmurs, ectopy, or gallups.  No lateral PMI. No thrills. Abdomen:   Abdomen is soft, non-tender, non-distended  No hernias, masses, or organomegaly  Normoactive bowel sounds.  Musculoskeletal:   No cyanosis, clubbing, or edema Skin:   No rashes, lesions, ulcers  palpation of skin: no induration or nodules Neurologic:   CN 2-12 intact  Sensation all 4 extremities intact Psychiatric:   Mental status o Mood, affect appropriate o Orientation to person, place, time   judgment and insight appear intact  I have personally reviewed the following:   Today's Data   Vitals, BMP  Imaging   CTA chest  Scheduled Meds:  amiodarone  200 mg Oral BID   apixaban  5 mg Oral BID   carvedilol  6.25 mg Oral BID WC   clonazePAM  0.5 mg  Oral BID   gabapentin  100 mg Oral TID   pantoprazole (PROTONIX) IV  40 mg Intravenous Q24H   sacubitril-valsartan  1 tablet Oral BID   spironolactone  12.5 mg Oral Daily   sucralfate  1 g Oral Q6H    Principal Problem:   Atrial fibrillation with RVR (HCC) Active Problems:   Hypothyroidism   Dyslipidemia   UNSPECIFIED ANEMIA   Essential hypertension   ESOPHAGEAL STRICTURE   Hiatal hernia with gastroesophageal reflux    Esophageal dysmotility   Asthma   Chest pain   CHF (congestive heart failure) (HCC)   Prolonged QT interval   Acute on chronic respiratory failure with hypoxia (HCC)   Chronic respiratory failure with hypoxia (HCC)   COPD (chronic obstructive pulmonary disease) (HCC)   Elevated troponin   Secondary cardiomyopathy (HCC) Esophageal dilatation with delayed transit of food  LOS: 5 days   A & P  Atrial fibrillation with RVR: New onset. CHA2D-VASC score 5.  TSH within normal limits (free T4 slightly up).  Echo 10/12 EF 45-50%, severe LAE, mild/mod MR, moderaly dilated RA.  Appreciate EP's assistance. No intervention planned until GI has addressed esophageal issue. She will be continued on the heparin and cardizem drip for now. Thyroid studies are within normal limits. Cardiology has been consulted as has EP.  Pt was taken down for DCCV, but she has converted to sinus rhythm. It is thought that her chest pain is due to atrial fibrillation.  Chest pain:  Patient had 10/10 pain radiation to the back.  Was concerned for complication of the dilation vs aortic dissection.  Obtained CTA which ruled out both but did show a dilated and fluid filled esophagus (stable from prior).  Had recentcardiac catheterization which  is reassuring.  GI has been consulted, but they state that they feel that pain is due to atrial fibrillation. The patient was taken down for DCCV by cardiology, but she converted to sinus first.  The patient is receiving Carafate as at home and low dose Dilaudid PRN.   Intractable nausea and vomiting: Due to stenosis of distal esophagus and partial obstruction by large hiatal hernia. I have discussed the patient with Dr. Chales Abrahams, as he performed the dilatation on 08/12/2019. He stated that the patient had botox injections at the site, but that they may not take affect for weeks. He recommended addition of CCB, as it may help relax the smooth muscle of the esophagus. The patient has declined  feeding tube. Dr. Chales Abrahams also recommended that the patient have only liquids and to sit up at 90 degrees for all PO intake. I greatly appreciate Dr. Urban Gibson help. Her symptoms have improved, although intake is low.  Elevated Troponin: Likely due to demand ischemia secondary to above.  Patient had left heart cath in Sept 2020 that show non-obstructive CAD. As per cardiology.  Hypothyroidism: Thyroid studies are within normal limits.  Dyslipidemia: Noted, chronic, and stable  Anemia: AOCD. Monitor.  Essential hypertension: Controlled blood pressure on  metoprolol, and entresto.  GERD:  chronic stable  Prolonged QT interval: Will monitor on telemetry. Avoid QT prolonging medications. Maintain K>4, Mg>2.  Hx of CHF:  Systolic CHF hold Entresto for tonight given soft BP pressures. Patient appears to be on a dry side will gently rehydrate. Monitor volume status closely.  Chronic respiratory failure with hypoxia [facility]): Currently at baseline continue to monitor continue home oxygen. Given increase dyspnea and tachycardia will check d.dimer.  Asthma: Not acutely exacerbated.  patient states  she never smoked. Continue home medications currently stable.  I have seen and examined this patient myself. I have spent 30 minutes in her evaluation and care.  DVT prophylaxis: heparin Code Status:   Code Status: Full Code  Family Communication: none at bedside Disposition Plan: pending clinical improvement,  likely home (vs SNF based on PT recs). Est 2-3 days.  Jaymin Waln, DO [facility] Direct contact: see [email or web address]  7PM-7AM contact night coverage as above 08/21/2019, 5:45 PM  LOS: 1 day

## 2019-08-22 NOTE — Progress Notes (Signed)
     Progress Note    ASSESSMENT AND PLAN:   1. GERD /.severe esophageal dysmotility leading to chronic, intermittent dysphagia. regurgitation. Recent esophageal dilation / botox. She has chronic, intermittent nausea / vomiting in addition to , or maybe partly due to severe esophageal dysmotility. Treatment for nausea / vomiting this admission limited by prolonged QT interval.   -We restarted home Klonipin yesterday. She tolerated dinner. Woke during night with nausea and vomiting. Today she had breakfast and so far no vomiting.  -Continue prn Zofran -Continue daily PPI -Continue Carafate slurry -Continue Klonopin  2. Chronic iron deficiency anemia, already evaluated by Dr. [name]       SUBJECTIVE    Vomiting a couple of times during the night but other than that no vomiting yesterday after dinner nor today so after breakfast since home anxiolytic was resumed.    OBJECTIVE:     Vital signs in last 24 hours: Temp:  [97.9 F (36.6 C)-98.2 F (36.8 C)] 98.2 F (36.8 C) (10/18 0559) Pulse Rate:  [56-63] 56 (10/18 0048) Resp:  [16-17] 17 (10/18 0048) BP: (133-156)/(56-109) 151/109 (10/18 0559) SpO2:  [98 %-100 %] 99 % (10/17 2218) Weight:  [64.7 kg] 64.7 kg (10/18 0559) Last BM Date: 08/21/19 General:   Alert, in NAD Heart:  Regular rate. No lower extremity edema   Pulm: Normal respiratory effor Abdomen:  Soft, nondistended, nontender.  Normal bowel sounds.          Neurologic:  Alert and  oriented x4;  grossly normal neurologically. Psych:  Pleasant, cooperative.  Normal mood and affect.   Intake/Output from previous day: 10/17 0701 - 10/18 0700 In: 240 [P.O.:240] Out: 250 [Urine:200; Emesis/NG output:50] Intake/Output this shift: No intake/output data recorded.  Lab Results: No results for input(s): WBC, HGB, HCT, PLT in the last 72 hours. BMET Recent Labs    08/20/19 0238 08/21/19 0407  NA 138 141  K 3.5 3.8  CL 100 100  CO2 23 28  GLUCOSE 115* 93  BUN  11 10  CREATININE 0.91 0.72  CALCIUM 8.5* 8.8*     Principal Problem:   Atrial fibrillation with RVR (HCC) Active Problems:   Hypothyroidism   Dyslipidemia   UNSPECIFIED ANEMIA   Essential hypertension   ESOPHAGEAL STRICTURE   Hiatal hernia with gastroesophageal reflux   Esophageal dysmotility   Asthma   Chest pain   CHF (congestive heart failure) (HCC)   Prolonged QT interval   Acute on chronic respiratory failure with hypoxia (HCC)   Chronic respiratory failure with hypoxia (HCC)   COPD (chronic obstructive pulmonary disease) (HCC)   Elevated troponin   Secondary cardiomyopathy ([place])     LOS: 5 days   Tye Savoy ,NP 08/22/2019, 8:56 AM

## 2019-08-23 DIAGNOSIS — K224 Dyskinesia of esophagus: Secondary | ICD-10-CM

## 2019-08-23 DIAGNOSIS — J9621 Acute and chronic respiratory failure with hypoxia: Secondary | ICD-10-CM

## 2019-08-23 LAB — CBC
HCT: 34.3 % — ABNORMAL LOW (ref 36.0–46.0)
Hemoglobin: 10.2 g/dL — ABNORMAL LOW (ref 12.0–15.0)
MCH: 27.9 pg (ref 26.0–34.0)
MCHC: 29.7 g/dL — ABNORMAL LOW (ref 30.0–36.0)
MCV: 94 fL (ref 80.0–100.0)
Platelets: 406 10*3/uL — ABNORMAL HIGH (ref 150–400)
RBC: 3.65 MIL/uL — ABNORMAL LOW (ref 3.87–5.11)
RDW: 17.9 % — ABNORMAL HIGH (ref 11.5–15.5)
WBC: 9.1 10*3/uL (ref 4.0–10.5)
nRBC: 0 % (ref 0.0–0.2)

## 2019-08-23 LAB — BASIC METABOLIC PANEL
Anion gap: 8 (ref 5–15)
BUN: <5 mg/dL — ABNORMAL LOW (ref 8–23)
CO2: 32 mmol/L (ref 22–32)
Calcium: 8.6 mg/dL — ABNORMAL LOW (ref 8.9–10.3)
Chloride: 102 mmol/L (ref 98–111)
Creatinine, Ser: 0.72 mg/dL (ref 0.44–1.00)
GFR calc Af Amer: >60 mL/min (ref >60)
GFR calc non Af Amer: >60 mL/min (ref >60)
Glucose, Bld: 104 mg/dL — ABNORMAL HIGH (ref 70–99)
Potassium: 3.4 mmol/L — ABNORMAL LOW (ref 3.5–5.1)
Sodium: 142 mmol/L (ref 135–145)

## 2019-08-23 LAB — MAGNESIUM: Magnesium: 1.8 mg/dL (ref 1.7–2.4)

## 2019-08-23 MED ORDER — APIXABAN 5 MG PO TABS: 5.0000 mg | ORAL_TABLET | Freq: Two times a day (BID) | ORAL | 0 refills | Status: DC

## 2019-08-23 MED ORDER — OMEPRAZOLE 20 MG PO CPDR: 20.0000 mg | DELAYED_RELEASE_CAPSULE | Freq: Every day | ORAL | 0 refills | Status: DC

## 2019-08-23 MED ORDER — AMIODARONE HCL 200 MG PO TABS: 200.0000 mg | ORAL_TABLET | Freq: Two times a day (BID) | ORAL | 0 refills | Status: DC

## 2019-08-23 MED ORDER — CARVEDILOL 6.25 MG PO TABS: 6.2500 mg | ORAL_TABLET | Freq: Two times a day (BID) | ORAL | 0 refills | Status: DC

## 2019-08-23 MED ORDER — ALUM & MAG HYDROXIDE-SIMETH 200-200-20 MG/5ML PO SUSP: 30.0000 mL | ORAL | 0 refills | Status: DC | PRN

## 2019-08-23 MED ORDER — POTASSIUM CHLORIDE 10 MEQ/100ML IV SOLN
10.0000 meq | INTRAVENOUS | Status: AC
  Administered 2019-08-23 (×4): 10 meq via INTRAVENOUS
  Filled 2019-08-23 (×4): qty 100

## 2019-08-23 MED ORDER — FAMOTIDINE 20 MG PO TABS: 20.0000 mg | ORAL_TABLET | Freq: Every day | ORAL | Status: DC

## 2019-08-23 MED ORDER — PROCHLORPERAZINE EDISYLATE 10 MG/2ML IJ SOLN
10.0000 mg | Freq: Four times a day (QID) | INTRAMUSCULAR | Status: DC | PRN
  Filled 2019-08-23: qty 2

## 2019-08-23 MED ORDER — OMEPRAZOLE 20 MG PO CPDR
20.0000 mg | DELAYED_RELEASE_CAPSULE | Freq: Every day | ORAL | Status: DC
  Administered 2019-08-23: 20 mg via ORAL
  Filled 2019-08-23: qty 1

## 2019-08-23 MED ORDER — MAGNESIUM SULFATE 2 GM/50ML IV SOLN
2.0000 g | Freq: Once | INTRAVENOUS | Status: AC
  Administered 2019-08-23: 2 g via INTRAVENOUS
  Filled 2019-08-23: qty 50

## 2019-08-23 NOTE — Progress Notes (Signed)
PT Cancellation Note  Patient Details Name: Julie Rice MRN: [id number] DOB: [birth year]   Cancelled Treatment:    Reason Eval/Treat Not Completed: Other (comment).  Pt declined PT stating she is too tired and is expecting to go home later.  Asked her to notify nursing if she changes her mind.  Follow up as time and pt allow.   Ramond Dial 08/23/2019, 3:44 PM   Mee Hives, PT [facility] Acute Rehab Dept. Number: [place] [facility] [place]

## 2019-08-23 NOTE — Plan of Care (Signed)

## 2019-08-23 NOTE — Progress Notes (Addendum)
The patient has been seen in conjunction with Bing Neighbors, NP. All aspects of care have been considered and discussed. The patient has been personally interviewed, examined, and all clinical data has been reviewed.   Now in NSR  Plan amiodarone anti-arrhythmic Rx for rhythom control since severe symptoms when in AF.  Will need f/u with [name].  Progress Note  Patient Name: Julie Rice Date of Encounter: 08/23/2019  Primary Cardiologist: Jenkins Rouge, MD   Subjective   Some nausea but no vomiting this morning.   Inpatient Medications    Scheduled Meds: . amiodarone  200 mg Oral BID  . apixaban  5 mg Oral BID  . carvedilol  6.25 mg Oral BID WC  . clonazePAM  0.5 mg Oral BID  . gabapentin  100 mg Oral TID  . pantoprazole (PROTONIX) IV  40 mg Intravenous Q24H  . sacubitril-valsartan  1 tablet Oral BID  . spironolactone  12.5 mg Oral Daily  . sucralfate  1 g Oral Q6H   Continuous Infusions:  PRN Meds: acetaminophen **OR** acetaminophen, albuterol, alum & mag hydroxide-simeth, HYDROcodone-acetaminophen, HYDROmorphone (DILAUDID) injection, prochlorperazine   Vital Signs    Vitals:   08/22/19 1048 08/22/19 1444 08/22/19 2029 08/23/19 0451  BP: (!) 130/47 (!) 149/58 (!) 115/58 (!) 141/62  Pulse: (!) 58 (!) 59  65  Resp:   18   Temp:  98.1 F (36.7 C) 98.2 F (36.8 C) 97.7 F (36.5 C)  TempSrc:  Oral Oral Oral  SpO2: 98% 97%  96%  Weight:      Height:        Intake/Output Summary (Last 24 hours) at 08/23/2019 0800 Last data filed at 08/22/2019 2230 Gross per 24 hour  Intake 610 ml  Output 1000 ml  Net -390 ml   Last 3 Weights 08/23/2019 08/22/2019 08/21/2019  Weight (lbs) (No Data) 142 lb 11.2 oz 142 lb 6.7 oz  Weight (kg) (No Data) 64.728 kg 64.6 kg      Telemetry    SR, PVCs - Personally Reviewed  ECG    No new tracing  Physical Exam  Pleasant older WF GEN: No acute distress.   Neck: No JVD Cardiac: RRR, no murmurs, rubs, or  gallops.  Respiratory: Clear to auscultation bilaterally. GI: Soft, nontender, non-distended  MS: No edema; No deformity. Neuro:  Nonfocal  Psych: Normal affect   Labs    High Sensitivity Troponin:   Recent Labs  Lab 08/11/19 0049 08/16/19 2035 08/16/19 2212 08/17/19 0030 08/17/19 0223  TROPONINIHS 20* 35* 32* 32* 30*      Chemistry Recent Labs  Lab 08/17/19 0400  08/19/19 0429 08/20/19 0238 08/21/19 0407  NA 141   < > 139 138 141  K 3.8   < > 3.5 3.5 3.8  CL 103   < > 102 100 100  CO2 25   < > 25 23 28   GLUCOSE 96   < > 101* 115* 93  BUN 8   < > 7* 11 10  CREATININE 0.68   < > 0.94 0.91 0.72  CALCIUM 8.3*   < > 8.4* 8.5* 8.8*  PROT 5.7*  --   --   --   --   ALBUMIN 2.9*  --   --   --   --   AST 18  --   --   --   --   ALT 17  --   --   --   --  ALKPHOS 62  --   --   --   --   BILITOT 0.6  --   --   --   --   GFRNONAA >60   < > 57* 59* >60  GFRAA >60   < > >60 >60 >60  ANIONGAP 13   < > 12 15 13    < > = values in this interval not displayed.     Hematology Recent Labs  Lab 08/17/19 0400 08/18/19 0314 08/19/19 0429  WBC 9.3 7.9 8.1  RBC 3.23*  3.23* 3.10* 3.12*  HGB 9.3* 8.6* 8.8*  HCT 30.0* 28.6* 28.6*  MCV 92.9 92.3 91.7  MCH 28.8 27.7 28.2  MCHC 31.0 30.1 30.8  RDW 15.8* 15.9* 16.2*  PLT 303 267 215    BNP Recent Labs  Lab 08/17/19 2020  BNP 599.2*     DDimer  Recent Labs  Lab 08/17/19 0030  DDIMER 1.85*     Radiology    No results found.  Cardiac Studies   TEE 08/19/2019: 1. Left ventricular ejection fraction, by visual estimation, is 25 to 30%. The left ventricle has severely decreased function. There is no left ventricular hypertrophy. 2. Global right ventricle has normal systolic function.The right ventricular size is normal. 3. Left atrial size was severely dilated. 4. Right atrial size was moderately dilated. 5. Mild mitral annular calcification. 6. The mitral valve is normal in structure. Moderate mitral valve  regurgitation. 7. The tricuspid valve is normal in structure. Tricuspid valve regurgitation moderate. 8. The aortic valve is tricuspid Aortic valve regurgitation is mild by color flow Doppler. Mild aortic valve sclerosis without stenosis. 9. The pulmonic valve was grossly normal. Pulmonic valve regurgitation is mild by color flow Doppler. 10. Mild plaque invoving the descending aorta. 11. Severe global reduction in LV systolic function; biatrial enlargement; atrial septal aneurysm; no LAA thrombus; mild AI; moderate MR and TR.  Cardiac catheterization 07/26/2019:  Right dominant coronary anatomy  Widely patent left main  30 to 40% mid LAD  Widely patent circumflex  Luminal irregularities in a dominant right coronary.  Right heart pressures are relatively low. Pulmonary wedge pressure mean is 2 mmHg.  LVEDP 4 mmHg. Left ventriculography by hand-injection was not helpful. From images obtained, LV function appears relatively normal.  Patient Profile     83 y.o. female with a history of systolic CHF, prolonged QT, hypertension, hyperlipidemia, fibromyalgia, esophageal stricture,large symptomatic hiatal hernia s/p fundoplication, chronic dysphagia, asthma/COPD 2L O2 at baseline, CAD, TIA, bipolar, RAwho is being seen today for the evaluation of chest pain and new onset afib RVRas well as mild acute heart failure.  Assessment & Plan    1.  New onset atrial fibrillation: with spontaneous conversion to sinus rhythm just prior to cardioversion.  CHADSVASC score is 7.  Amiodarone load was reduced (10/17) with continuation of Eliquis.   2.  Prolonged QTc: 535 on EKG from 10/17.  Amiodarone was reduced, also taken off standing Zofran. Will recheck EKG this morning.  3.  Nonobstructive CAD: by cardiac catheterization on September 21.  4.  Acute on chronic systolic heart failure:  LVEF 25 to 30% by recent assessment.  Now on coreg, Entresto, spiro. Volume stable on exam.   5.  Chronic Nausea/dysphagia: stable this morning. Resumed on benzos. Cleared for discharge via GI this morning. Recommendations noted for PPI.    6. NSVT: noted on telemetry. Will check BMET this morning. On BB therapy.   For questions or updates, please contact [facility] Please consult [email or web address] for contact info under    Signed, Laverda Page, NP  08/23/2019, 8:00 AM

## 2019-08-23 NOTE — Progress Notes (Signed)
Occupational Therapy Treatment Patient Details Name: Julie Rice MRN: [id number] DOB: [birth year] Today's Date: 08/23/2019    History of present illness 83 yo female with onset of HR up to 140 at home was admitted, now referred to PT for mobility ck.  Recent admit for PNA, susp aspiration.  Pt now returns and note chest pain chronically now, cleared for PE, covid (-).  PMHx:  COPD, CAD, cardiomegaly, RA, TIA, CHF, HTN, bipolar, atherosclerosis   OT comments  Pt making good progress with functional goals. Pt in bathroom with RN present upon arrival. Pt participated in oral and grooming tasks standing at sink with set up and sup. Pt ambulated to recliner with HHA and sat in recliner for simulated bathing tasks and UB dressing tasks with mod I. Pt states that she is feeling much better and is ready to go home. OT will continue to follow acutely  Follow Up Recommendations  Home health OT;Supervision/Assistance - 24 hour    Equipment Recommendations  None recommended by OT;Other (comment)(has necessary DME at home in storage)    Recommendations for Other Services      Precautions / Restrictions Precautions Precautions: Fall Precaution Comments: incontinent of urine Restrictions Weight Bearing Restrictions: No       Mobility Bed Mobility               General bed mobility comments: pt in restroom with RN present upon arrival  Transfers Overall transfer level: Needs assistance Equipment used: 1 person hand held assist Transfers: Sit to/from Stand Sit to Stand: Supervision         General transfer comment: supervision for safety    Balance Overall balance assessment: Needs assistance Sitting-balance support: Feet supported Sitting balance-Leahy Scale: Good     Standing balance support: Bilateral upper extremity supported;Single extremity supported;During functional activity Standing balance-Leahy Scale: Fair                             ADL either  performed or assessed with clinical judgement   ADL Overall ADL's : Needs assistance/impaired Eating/Feeding: Independent;Sitting   Grooming: Wash/dry hands;Standing;Supervision/safety;Set up;Wash/dry face;Oral care   Upper Body Bathing: Modified independent;Sitting Upper Body Bathing Details (indicate cue type and reason): simluated     Upper Body Dressing : Modified independent;Sitting       Toilet Transfer: Supervision/safety;Ambulation   Toileting- Clothing Manipulation and Hygiene: Supervision/safety;Sit to/from stand       Functional mobility during ADLs: Supervision/safety General ADL Comments: pt stood at sink for oral care     Vision Baseline Vision/History: No visual deficits Patient Visual Report: No change from baseline     Perception     Praxis      Cognition Arousal/Alertness: Awake/alert Behavior During Therapy: WFL for tasks assessed/performed Overall Cognitive Status: Within Functional Limits for tasks assessed                                          Exercises     Shoulder Instructions       General Comments      Pertinent Vitals/ Pain       Pain Assessment: No/denies pain Faces Pain Scale: No hurt Pain Intervention(s): Monitored during session  Home Living  Prior Functioning/Environment              Frequency  Min 2X/week        Progress Toward Goals  OT Goals(current goals can now be found in the care plan section)  Progress towards OT goals: Progressing toward goals     Plan Discharge plan remains appropriate    Co-evaluation                 AM-PAC OT "6 Clicks" Daily Activity     Outcome Measure   Help from another person eating meals?: None Help from another person taking care of personal grooming?: A Little Help from another person toileting, which includes using toliet, bedpan, or urinal?: A Little Help from another person  bathing (including washing, rinsing, drying)?: A Little Help from another person to put on and taking off regular upper body clothing?: None Help from another person to put on and taking off regular lower body clothing?: A Little 6 Click Score: 20    End of Session    OT Visit Diagnosis: Unsteadiness on feet (R26.81);Muscle weakness (generalized) (M62.81);Pain   Activity Tolerance Patient tolerated treatment well   Patient Left in chair;with call bell/phone within reach   Nurse Communication          Time: 2376-2831 OT Time Calculation (min): 21 min  Charges: OT General Charges $OT Visit: 1 Visit OT Treatments $Self Care/Home Management : 8-22 mins     Britt Bottom 08/23/2019, 1:11 PM

## 2019-08-23 NOTE — Progress Notes (Signed)
RN called [facility] on [address] [place] to confirm medication would be ready to pick up tonight. Christy in pharmacy at [facility] confirmed all medications and stated medications would be ready shortly for pt to pick up. Pt understands the importance of picking up these medications and stated she will go straight to [facility] after she is discharged with her sister in law.

## 2019-08-23 NOTE — Progress Notes (Signed)
[facility] Progress Note    Since last GI note: She felt much better throughout the day yesterday.  Mild nausea but no vomiting at all.  Ate solid food. This morning she woke with her usual nausea but has not vomited.  She wants to switch back to her previous PPI (prilosec) from protonix, says it works better.  Objective: Vital signs in last 24 hours: Temp:  [97.7 F (36.5 C)-98.2 F (36.8 C)] 97.7 F (36.5 C) (10/19 0451) Pulse Rate:  [58-65] 65 (10/19 0451) Resp:  [18] 18 (10/18 2029) BP: (115-149)/(47-62) 141/62 (10/19 0451) SpO2:  [96 %-98 %] 96 % (10/19 0451) Last BM Date: 08/21/19 General: alert and oriented times 3 Heart: regular rate and rythm Abdomen: soft, non-tender, non-distended, normal bowel sounds   Lab Results: No results for input(s): WBC, HGB, PLT, MCV in the last 72 hours. Recent Labs    08/21/19 0407  NA 141  K 3.8  CL 100  CO2 28  GLUCOSE 93  BUN 10  CREATININE 0.72  CALCIUM 8.8*   No results for input(s): PROT, ALBUMIN, AST, ALT, ALKPHOS, BILITOT, BILIDIR, IBILI in the last 72 hours. No results for input(s): INR in the last 72 hours.   Studies/Results: No results found.   Medications: Scheduled Meds: . amiodarone  200 mg Oral BID  . apixaban  5 mg Oral BID  . carvedilol  6.25 mg Oral BID WC  . clonazePAM  0.5 mg Oral BID  . gabapentin  100 mg Oral TID  . pantoprazole (PROTONIX) IV  40 mg Intravenous Q24H  . sacubitril-valsartan  1 tablet Oral BID  . spironolactone  12.5 mg Oral Daily  . sucralfate  1 g Oral Q6H   Continuous Infusions: PRN Meds:.acetaminophen **OR** acetaminophen, albuterol, alum & mag hydroxide-simeth, HYDROcodone-acetaminophen, HYDROmorphone (DILAUDID) injection, prochlorperazine    Assessment/Plan: 83 y.o. female with chronic nausea, dysphagia, intermittent vomiting from severe esophageal dysmotility.  Doing better since resuming benzos while inpatient.  SHe is OK to d/c home from my perspective.   Please change her to omeprazole 20mg  pills, one pill twice daily and also have her start a bedtime H2 blocker (may help with her nausea in the AM).   Please call or page with any further questions or concerns.   Milus Banister, MD  08/23/2019, 7:39 AM [facility] Pager [phone]

## 2019-08-23 NOTE — Care Management Important Message (Signed)
Important Message  Patient Details  Name: Julie Rice MRN: [id number] Date of Birth: [birth year]   Medicare Important Message Given:  Yes     Shelda Altes 08/23/2019, 1:37 PM

## 2019-08-24 ENCOUNTER — Other Ambulatory Visit: Payer: Self-pay | Admitting: *Deleted

## 2019-08-24 MED FILL — SM ANTACID MAX STRENGTH SUS: 400-400-40 | 2 days supply | Qty: 355 | Fill #0

## 2019-08-24 NOTE — Discharge Summary (Signed)
Physician Discharge Summary  Margret ChanceDelores C Kingsbury UJW:[id number] DOB: 07/15/1936 DOA: 08/16/2019  PCP: [name], [name], NP  Admit date: 08/16/2019 Discharge date: 08/24/2019  Recommendations for Outpatient Follow-up:  1. Eat only sitting up at 90 degrees. 2. Follow up with PCP in 7-10 days. 3. Chemistry to be drawn in 7 days and reported to PCP. 4. Patient to follow up with Dr. Eden EmmsNishan within 2 weeks. 5. Patient to follow up with Dr. Chales AbrahamsGupta within one month.  Follow-up Information    [facility] Follow up.   Specialty: Home Health Services Why: Registered Nurse, Physical and Occupational Therapy, Social Worker- office to call you with visit time.  Contact information: [address] [place] [address] [phone]          Discharge Diagnoses: Principal diagnosis is #1 1. Atrial fibrillation with RVR 2. Chest Pain 3. Intractable nausea and vomiting 4. Elevated troponin 5. Hypothyroidism 6. Dyslipidemia 7. Anemia 8. Hypertension 9. GERD 10. Prolonged QT interval 11. History of systolic CHF 12. Asthma  Discharge Condition: Fair  Disposition: Home  Diet recommendation: regular  Filed Weights   08/20/19 0410 08/21/19 0443 08/22/19 0559  Weight: 65.3 kg 64.6 kg 64.7 kg    History of present illness:   Ms. Julie Rice looks to have been seen very remotely (1990's) by Dr. Eden EmmsNishan and not since then.  Recently referred back for some reports of CP and palpitations, though presented  to the hospital last month prior  To her appointment date.  She was noted to have new reduced LVEF 40%, CHF, was in SR w/PVCs.  Cardiology noted she had a 4 day monitor via her PMD noting intermittent runs of SVT with the longest being 13 beats.  She was diuresed, underwent R/LHC Widely patent coronary arteries, Low right heart and LV filling pressures During her stay she had an SVT that responded to adenosine She was discharged 07/27/2019  She was re-admitted  08/07/2019 with SOB, hypoxic resp failure, found with aspiration pneumonia. Barium swallow study showed significant narrowing of the upper part of the esophagus but no esophageal stricture. Underwent EGD with finding of benign-appearing esophageal stenosis.Status post fundoplication, Botox injection, balloon dilation.  I do not see that she had any cardiac issues during this hospitalization  [facility] with c/o on/off CP, HHRN noted pulse irregular, ranging 60-140 and recommended she go to the ER.  She was found in AFib w/RVR New echo this admission noted EF up some to 40-45%, initially placed on dilt though changed to BB, and heparin gtt for a/c Looks like she is planned for TEE/DCCV, though ? If pending GI 1st given her history and ongoing nausea, as well as any anticoagulation recommendations, concerns, for final anticoagulation plan  EP is asked to weigh in on AAD options, noting severely dilated LA, likely to need drug to help maintain SR.  Rhythm control preferred strategy with reduced LVEF ad recent CHF exacerbation.  Hospital Course:  83 y.o.femalewith medical history significant of hypertension, hyperlipidemia, systolic CHF, fibromyalgia, anemia, esophageal strictures/p balloon dilation, large symptomatic hiatal hernia status post fundoplication, chronic dysphagia, asthma/COPD on oxygen at 2 L at home, CAD, TIA, bipolar disorderfibromyalgia, rheumatoid arthritis, TIA, chronic urinary incontinence.   She presented with heart rate 60s-140at home per home health nurse. Patient has not filled her medications since her discharge with still having some intermittent chest pain and she was told to come to emergency department. She reports while hospitalized she  has an episode of tachycardia with HR p to 170's she was given a medicine and then her HR has come down. She has no known hx of A.fib  She took 2 nitro and 3/24 of aspirin and presented to emergency  department.Chest pain report on and off for weeks, is worse with swallowing. Has been swallowing soft food ok.Reported burning pain in her chest radiating to her back and arms.She felt very weak and fell back wards no LOC.No fever no chills.Last night she felt sweaty her temp was 99.9.Reports Shortness of breath since last summerbut hasbeen getting progressively worse.  Relative recent history: Initially admitted on 10/3/20200with shortness of breath and chest pain was found to have lower lobe pneumonia thought to be secondary to aspiration COVID negative treated with broad-spectrum antibiotics there was worrisome for esophageal stenosis GI was consulted and she underwent balloon dilatation and Botox injection Physical therapy seen her and recommended home health and discharge and she was discharged home on 9 October.  EKG showed no evidence of ischemic changes thought to be secondary to demand ischemia.  Today's assessment: S: The patient is resting comfortably. No new complaints. O: Vitals:  Vitals:   08/23/19 1710 08/23/19 1824  BP:    Pulse: 66 82  Resp:    Temp:    SpO2: 99% 98%   Constitutional:   The patient is awake, alert, and oriented x 3. No acute distress. Respiratory:   No increased work of breathing.  No wheezes, rales, or rhonchi  No tactile fremitus Cardiovascular:   Regular rate and rhythm  No murmurs, ectopy, or gallups.  No lateral PMI. No thrills. Abdomen:   Abdomen is soft, non-tender, non-distended  No hernias, masses, or organomegaly  Normoactive bowel sounds.  Musculoskeletal:   No cyanosis, clubbing, or edema Skin:   No rashes, lesions, ulcers  palpation of skin: no induration or nodules Neurologic:   CN 2-12 intact  Sensation all 4 extremities intact Psychiatric:   Mental status ? Mood, affect appropriate ? Orientation to person, place, time   judgment and insight appear intact  Discharge Instructions   Discharge Instructions    Activity as tolerated - No restrictions   Complete by: As directed    Call MD for:  persistant dizziness or light-headedness   Complete by: As directed    Call MD for:  persistant nausea and vomiting   Complete by: As directed    Call MD for:  severe uncontrolled pain   Complete by: As directed    Diet - low sodium heart healthy   Complete by: As directed    Discharge instructions   Complete by: As directed    Eat only sitting up at 90 degrees. Follow up with PCP in 7-10 days. Chemistry to be drawn in 7 days and reported to PCP. Patient to follow up with Dr. Eden Emms within 2 weeks. Patient to follow up with Dr. Chales Abrahams within one month.   Increase activity slowly   Complete by: As directed      Allergies as of 08/23/2019      Reactions   Morphine Other (See Comments)   Flushing, rash, itching   Tape Other (See Comments)   Band aides, adhesive tape Redness and pulls skin off      Medication List    STOP taking these medications   aspirin 81 MG EC tablet   ferrous sulfate 325 (65 FE) MG tablet   guaiFENesin-dextromethorphan 100-10 MG/5ML syrup Commonly known as: ROBITUSSIN DM  losartan-hydrochlorothiazide 100-25 MG tablet Commonly known as: HYZAAR   meloxicam 15 MG tablet Commonly known as: MOBIC   pantoprazole 40 MG tablet Commonly known as: PROTONIX   sertraline 100 MG tablet Commonly known as: ZOLOFT     TAKE these medications   acetaminophen 325 MG tablet Commonly known as: TYLENOL Take 2 tablets (650 mg total) by mouth every 4 (four) hours as needed for headache or mild pain.   albuterol 108 (90 Base) MCG/ACT inhaler Commonly known as: VENTOLIN HFA Inhale 2 puffs into the lungs every 6 (six) hours as needed for wheezing or shortness of breath.   alum & mag hydroxide-simeth 200-200-20 MG/5ML suspension Commonly known as: MAALOX/MYLANTA Take 30 mLs by mouth every 4 (four) hours as needed for indigestion.   amiodarone 200 MG  tablet Commonly known as: PACERONE Take 1 tablet (200 mg total) by mouth 2 (two) times daily.   amLODipine 2.5 MG tablet Commonly known as: NORVASC Take 2.5 mg by mouth daily.   apixaban 5 MG Tabs tablet Commonly known as: ELIQUIS Take 1 tablet (5 mg total) by mouth 2 (two) times daily.   carvedilol 6.25 MG tablet Commonly known as: COREG Take 1 tablet (6.25 mg total) by mouth 2 (two) times daily with a meal. What changed:   medication strength  See the new instructions.   clonazePAM 0.5 MG tablet Commonly known as: KLONOPIN TAKE 1 TABLET BY MOUTH TWICE A DAY AS NEEDED FOR ANXIETY What changed: See the new instructions.   gabapentin 100 MG capsule Commonly known as: NEURONTIN TAKE 1 CAPSULE BY MOUTH THREE TIMES A DAY What changed: See the new instructions.   loperamide 2 MG capsule Commonly known as: IMODIUM Take 1 capsule (2 mg total) by mouth as needed for diarrhea or loose stools.   multivitamin tablet Take 1 tablet by mouth daily. Vitamin supplement.   omeprazole 20 MG capsule Commonly known as: PRILOSEC Take 1 capsule (20 mg total) by mouth daily.   sacubitril-valsartan 24-26 MG Commonly known as: ENTRESTO Take 1 tablet by mouth 2 (two) times daily.   spironolactone 25 MG tablet Commonly known as: ALDACTONE Take 0.5 tablets (12.5 mg total) by mouth daily.   sucralfate 1 g tablet Commonly known as: CARAFATE Take 1 tablet (1 g total) by mouth 4 (four) times daily. Dissolve tablet in 10 ml warm water What changed:   when to take this  reasons to take this   tetrahydrozoline 0.05 % ophthalmic solution Place 1 drop into both eyes daily.      Allergies  Allergen Reactions  . Morphine Other (See Comments)    Flushing, rash, itching  . Tape Other (See Comments)    Band aides, adhesive tape Redness and pulls skin off    The results of significant diagnostics from this hospitalization (including imaging, microbiology, ancillary and laboratory) are  listed below for reference.    Significant Diagnostic Studies: Dg Chest Portable 1 View  Result Date: 08/16/2019 CLINICAL DATA:  83 year old female with chest pain. EXAM: PORTABLE CHEST 1 VIEW COMPARISON:  Chest radiograph and CT dated 08/07/2019 dated 07/22/2019 FINDINGS: Small bilateral pleural effusions with bibasilar atelectasis versus infiltrate. Significant interval improvement of left pulmonary airspace opacity seen on the radiograph of 08/07/2019. Mild cardiomegaly. Atherosclerotic calcification of the aortic arch. No acute osseous pathology. IMPRESSION: 1. Small bilateral pleural effusions with bibasilar atelectasis versus infiltrate. 2. Significant interval improvement of the left pulmonary airspace opacity seen on the radiograph of 08/07/2019. Electronically Signed   By: Burtis Junes  Radparvar M.D.   On: 08/16/2019 21:06   Dg Chest Portable 1 View  Result Date: 08/07/2019 CLINICAL DATA:  Recurrent chest pain, shortness of breath EXAM: PORTABLE CHEST 1 VIEW COMPARISON:  07/22/2019 FINDINGS: Extensive left upper lobe and left lower lobe airspace disease. Mild right lower lobe airspace disease. No right pleural effusion. Possible trace left pleural effusion. No pneumothorax. Stable cardiomegaly. No acute osseous abnormality. Moderate osteoarthritis of the left glenohumeral joint. IMPRESSION: Extensive airspace disease in the left upper lobe and left lower lobe and to a much lesser extent right lower lobe most concerning for multilobar pneumonia. Electronically Signed   By: Elige Ko   On: 08/07/2019 06:26   Ct Angio Chest/abd/pel For Dissection W And/or W/wo  Result Date: 08/17/2019 CLINICAL DATA:  Chest pain, shortness of breath. Recent esophageal dilatation. EXAM: CT ANGIOGRAPHY CHEST, ABDOMEN AND PELVIS TECHNIQUE: Multidetector CT imaging through the chest, abdomen and pelvis was performed using the standard protocol during bolus administration of intravenous contrast. Multiplanar  reconstructed images and MIPs were obtained and reviewed to evaluate the vascular anatomy. CONTRAST:  OMNIPAQUE IOHEXOL 350 MG/ML SOLN COMPARISON:  CTA chest 07/22/2019 FINDINGS: CTA CHEST FINDINGS Cardiovascular: Aortic atherosclerosis. No aneurysm or dissection. Cardiomegaly. No filling defects in the pulmonary arteries to suggest pulmonary emboli. Mediastinum/Nodes: The esophagus is dilated and fluid-filled, similar to prior study. No mediastinal, hilar, or axillary adenopathy. Lungs/Pleura: Biapical scarring. Moderate bilateral pleural effusions have increased in size since prior study. Compressive atelectasis in both lower lobes. Areas of scarring in the right middle lobe and lingula. Scattered ground-glass nodular densities are stable. Musculoskeletal: Chest wall soft tissues are unremarkable. No acute bony abnormality. Review of the MIP images confirms the above findings. CTA ABDOMEN AND PELVIS FINDINGS VASCULAR Aorta: Aortic atherosclerosis.  No aneurysm or dissection. Celiac: Patent without evidence of aneurysm, dissection, vasculitis or significant stenosis. SMA: Patent without evidence of aneurysm, dissection, vasculitis or significant stenosis. Renals: Both renal arteries are patent without evidence of aneurysm, dissection, vasculitis, fibromuscular dysplasia or significant stenosis. IMA: Patent without evidence of aneurysm, dissection, vasculitis or significant stenosis. Inflow: Atherosclerotic calcifications. No aneurysm, stenosis or dissection. Veins: No obvious venous abnormality within the limitations of this arterial phase study. Review of the MIP images confirms the above findings. NON-VASCULAR Hepatobiliary: 1 No focal hepatic abnormality. Gallbladder unremarkable. Pancreas: No focal abnormality or ductal dilatation. Spleen: No focal abnormality.  Normal size. Adrenals/Urinary Tract: No adrenal abnormality. No focal renal abnormality. No stones or hydronephrosis. Urinary bladder is  unremarkable. Stomach/Bowel: Scattered sigmoid diverticulosis. No active diverticulitis. Stomach and small bowel decompressed, unremarkable. Lymphatic: No adenopathy Reproductive: Prior hysterectomy.  No adnexal masses. Other: No free fluid or free air. Musculoskeletal: Postoperative changes in the lumbar spine. No acute bony abnormality. Review of the MIP images confirms the above findings. IMPRESSION: No evidence of aortic aneurysm or dissection. No evidence of pulmonary embolus. Moderate bilateral pleural effusions. Compressive atelectasis in the lower lobes. Scattered reticulonodular densities and ground-glass nodular densities throughout the lungs are stable. Dilated, fluid-filled esophagus is stable. No acute findings in the abdomen or pelvis. Aortic atherosclerosis, cardiomegaly. Electronically Signed   By: Charlett Nose M.D.   On: 08/17/2019 08:41   Dg Esophagus W Single Cm (sol Or Thin Ba)  Result Date: 08/10/2019 CLINICAL DATA:  Patient presents with difficulty swallowing with nausea vomiting. She has a long history of esophageal problems. She underwent a fundoplication in 2017. She has had repeated endoscopies, last performed in November 2019 with balloon dilatation. Small hiatal hernia  and esophageal diverticulum noted at that study. EXAM: ESOPHOGRAM/BARIUM SWALLOW TECHNIQUE: Single contrast examination was performed using  thin barium. FLUOROSCOPY TIME:  Fluoroscopy Time:  1 minutes and 48 seconds. Radiation Exposure Index (if provided by the fluoroscopic device): 15.7 mGy Number of Acquired Spot Images: 1 series.  Thirty-three images. COMPARISON:  chest radiograph, 08/07/2019. CT angiogram of the chest, 07/22/2019 FINDINGS: Pharyngeal swallowing function is unremarkable with no evidence of aspiration or penetration. No visualized mass or diverticulum. Esophagus is mildly distended. There is a hiatal hernia with a nonobstructing mucosal ring at the junction of the distal esophagus and hiatal hernia.  There is no significant narrowing of the esophagus. No mass or mucosal abnormality. Esophagus shows abnormal motility with tertiary contractions and significant distal barium stasis. No reflux was documented during the exam. Below the hiatal hernia, just below the level of the hemidiaphragm, there is a significant narrowing of the stomach, extending for approximately 4 cm in length. This area of narrowing causes significant holdup to passage of barium. Below the narrowed proximal stomach, the remainder of the stomach was mostly decompressed, but normal in configuration with a grossly normal mucosal fold pattern. Barium was seen extending into the duodenum and proximal small bowel. IMPRESSION: 1. Significant narrowing of the proximal stomach just below a small hiatal hernia, just below the level of the hemidiaphragm. This may reflect the patient's fundoplication. It causes significant holdup to passage of the barium with distal barium stasis. 2. No evidence of an esophageal mass or stricture. No esophageal inflammation. 3. Abnormal esophageal motility with tertiary contractions. Electronically Signed   By: Lajean Manes M.D.   On: 08/10/2019 10:11    Microbiology: Recent Results (from the past 240 hour(s))  SARS CORONAVIRUS 2 (TAT 6-24 HRS) Nasopharyngeal Nasopharyngeal Swab     Status: None   Collection Time: 08/17/19  1:00 AM   Specimen: Nasopharyngeal Swab  Result Value Ref Range Status   SARS Coronavirus 2 NEGATIVE NEGATIVE Final    Comment: (NOTE) SARS-CoV-2 target nucleic acids are NOT DETECTED. The SARS-CoV-2 RNA is generally detectable in upper and lower respiratory specimens during the acute phase of infection. Negative results do not preclude SARS-CoV-2 infection, do not rule out co-infections with other pathogens, and should not be used as the sole basis for treatment or other patient management decisions. Negative results must be combined with clinical observations, patient history, and  epidemiological information. The expected result is Negative. Fact Sheet for Patients: SugarRoll.be Fact Sheet for Healthcare Providers: https://www.woods-mathews.com/ This test is not yet approved or cleared by the [place] FDA and  has been authorized for detection and/or diagnosis of SARS-CoV-2 by FDA under an Emergency Use Authorization (EUA). This EUA will remain  in effect (meaning this test can be used) for the duration of the COVID-19 declaration under Section 56 4(b)(1) of the Act, 21 U.S.C. section 360bbb-3(b)(1), unless the authorization is terminated or revoked sooner. Performed at [facility], [place] [address] [place], [place] [address]      Labs: Basic Metabolic Panel: Recent Labs  Lab 08/18/19 0314 08/19/19 0429 08/20/19 0238 08/21/19 0407 08/23/19 0916 08/23/19 0923  NA 142 139 138 141 142  --   K 4.9 3.5 3.5 3.8 3.4*  --   CL 105 102 100 100 102  --   CO2 29 25 23 28  32  --   GLUCOSE 99 101* 115* 93 104*  --   BUN 6* 7* 11 10 <5*  --   CREATININE 0.78  0.94 0.91 0.72 0.72  --   CALCIUM 8.0* 8.4* 8.5* 8.8* 8.6*  --   MG 2.0  --  2.0 1.8  --  1.8   Liver Function Tests: No results for input(s): AST, ALT, ALKPHOS, BILITOT, PROT, ALBUMIN in the last 168 hours. No results for input(s): LIPASE, AMYLASE in the last 168 hours. No results for input(s): AMMONIA in the last 168 hours. CBC: Recent Labs  Lab 08/18/19 0314 08/19/19 0429 08/23/19 0916  WBC 7.9 8.1 9.1  HGB 8.6* 8.8* 10.2*  HCT 28.6* 28.6* 34.3*  MCV 92.3 91.7 94.0  PLT 267 215 406*   Cardiac Enzymes: No results for input(s): CKTOTAL, CKMB, CKMBINDEX, TROPONINI in the last 168 hours. BNP: BNP (last 3 results) Recent Labs    07/22/19 1021 08/07/19 0710 08/17/19 2020  BNP 723.7* 748.8* 599.2*    ProBNP (last 3 results) Recent Labs    01/12/19 1427  PROBNP 196.0*    CBG: No results for input(s): GLUCAP in the last 168 hours.   Principal Problem:   Atrial fibrillation with RVR (HCC) Active Problems:   Hypothyroidism   Dyslipidemia   UNSPECIFIED ANEMIA   Essential hypertension   ESOPHAGEAL STRICTURE   Hiatal hernia with gastroesophageal reflux   Esophageal dysmotility   Asthma   Chest pain   CHF (congestive heart failure) (HCC)   Prolonged QT interval   Acute on chronic respiratory failure with hypoxia (HCC)   Chronic respiratory failure with hypoxia (HCC)   COPD (chronic obstructive pulmonary disease) (HCC)   Elevated troponin   Secondary cardiomyopathy (HCC)   Time coordinating discharge: 38 minutes.  Signed:        Robecca Fulgham, DO [facility]  08/24/2019, 5:42 PM

## 2019-08-24 NOTE — Consult Note (Signed)
   [facility] CM Inpatient Consult   08/24/2019  Julie Rice 1936-08-22 [id number]    Patient's chart reviewed for a 7 day readmission, 3 hospitalizations and 1 ED visit in the past 6 months and a 30 day readmission; also to checkforpotential [place] Management services needed under her [facility] plan with a 23% high risk scorefor unplanned readmission.  Perchart reviewed andMD brief history show asfollows:  83 y.o. female with a history of systolic CHF, prolonged QT, hypertension, hyperlipidemia, fibromyalgia, esophageal stricture,large symptomatic hiatal hernia s/p fundoplication, chronic dysphagia, asthma/COPD 2L O2 at baseline, CAD, TIA, bipolar, RA,   who is being seen for evaluation of chest pain and new onset afib RVRas well as mild acute heart failure.   Initially admitted on 08/07/19 - 10/9/20with shortness of breath and chest pain was found to have lower lobe pneumonia thought to be secondary to aspiration, COVID negative- treated with broad-spectrum antibiotics; there was worrisome for esophageal stenosis.  Transition of care CM note states that patient lives alone prior to admission with a sister's support and supervision at home. Discharge plan is to go home with home health services ([facility]- RN, PT/OT, SW); with no further needs identified at this time.  Her primary care provider is Media planner at [employer] of care.  Patient transitionedto home prior to speaking withher.  Plan: Will followpatientwith [facility] Generalcalls to follow-up recovery.   For questionsand additional information,please call:  Cayley Pester A. Milaina Sher, BSN, RN-BC [facility] Liaison Cell: [phone]

## 2019-08-25 ENCOUNTER — Telehealth: Payer: Self-pay

## 2019-08-25 NOTE — Telephone Encounter (Signed)
Called pt to set up apt with EP, left message asking pt to call the office. If possible she needs to see JV or AT or anyone on EP team.

## 2019-08-26 DIAGNOSIS — J9611 Chronic respiratory failure with hypoxia: Secondary | ICD-10-CM | POA: Diagnosis not present

## 2019-08-26 DIAGNOSIS — J449 Chronic obstructive pulmonary disease, unspecified: Secondary | ICD-10-CM | POA: Diagnosis not present

## 2019-08-27 ENCOUNTER — Ambulatory Visit: Payer: Medicare Other | Admitting: Cardiovascular Disease

## 2019-08-27 ENCOUNTER — Other Ambulatory Visit: Payer: Self-pay | Admitting: Adult Health

## 2019-08-27 NOTE — Telephone Encounter (Signed)
She had a new prescription sent in on 10/20 for an increase dose.

## 2019-08-31 ENCOUNTER — Telehealth: Payer: Self-pay | Admitting: Adult Health

## 2019-08-31 NOTE — Telephone Encounter (Signed)
Spoke to [place] and informed her to resume care.  Nothing further needed.

## 2019-08-31 NOTE — Telephone Encounter (Signed)
Benjamine Mola, RN with [facility], requesting VO for resumption of care.

## 2019-09-07 ENCOUNTER — Ambulatory Visit: Payer: Medicare Other | Admitting: Internal Medicine

## 2019-09-07 ENCOUNTER — Encounter: Payer: Self-pay | Admitting: Internal Medicine

## 2019-09-07 ENCOUNTER — Other Ambulatory Visit: Payer: Self-pay

## 2019-09-07 DIAGNOSIS — R0609 Other forms of dyspnea: Secondary | ICD-10-CM

## 2019-09-07 DIAGNOSIS — R0600 Dyspnea, unspecified: Secondary | ICD-10-CM | POA: Diagnosis not present

## 2019-09-07 DIAGNOSIS — J9611 Chronic respiratory failure with hypoxia: Secondary | ICD-10-CM | POA: Diagnosis not present

## 2019-09-07 DIAGNOSIS — J90 Pleural effusion, not elsewhere classified: Secondary | ICD-10-CM

## 2019-09-07 DIAGNOSIS — J189 Pneumonia, unspecified organism: Secondary | ICD-10-CM

## 2019-09-07 LAB — HEPATIC FUNCTION PANEL
ALT: 10 U/L (ref 0–35)
AST: 15 U/L (ref 0–37)
Albumin: 3.9 g/dL (ref 3.5–5.2)
Alkaline Phosphatase: 70 U/L (ref 39–117)
Bilirubin, Direct: 0.1 mg/dL (ref 0.0–0.3)
Total Bilirubin: 0.6 mg/dL (ref 0.2–1.2)
Total Protein: 6.7 g/dL (ref 6.0–8.3)

## 2019-09-07 LAB — CBC WITH DIFFERENTIAL/PLATELET
Basophils Absolute: 0.1 10*3/uL (ref 0.0–0.1)
Basophils Relative: 0.7 % (ref 0.0–3.0)
Eosinophils Absolute: 0.3 10*3/uL (ref 0.0–0.7)
Eosinophils Relative: 1.8 % (ref 0.0–5.0)
HCT: 35.1 % — ABNORMAL LOW (ref 36.0–46.0)
Hemoglobin: 11 g/dL — ABNORMAL LOW (ref 12.0–15.0)
Lymphocytes Relative: 7.6 % — ABNORMAL LOW (ref 12.0–46.0)
Lymphs Abs: 1.3 10*3/uL (ref 0.7–4.0)
MCHC: 31.4 g/dL (ref 30.0–36.0)
MCV: 90.6 fl (ref 78.0–100.0)
Monocytes Absolute: 0.6 10*3/uL (ref 0.1–1.0)
Monocytes Relative: 3.2 % (ref 3.0–12.0)
Neutro Abs: 15.2 10*3/uL — ABNORMAL HIGH (ref 1.4–7.7)
Neutrophils Relative %: 86.7 % — ABNORMAL HIGH (ref 43.0–77.0)
Platelets: 218 10*3/uL (ref 150.0–400.0)
RBC: 3.88 Mil/uL (ref 3.87–5.11)
RDW: 18 % — ABNORMAL HIGH (ref 11.5–15.5)
WBC: 17.5 10*3/uL — ABNORMAL HIGH (ref 4.0–10.5)

## 2019-09-07 LAB — BASIC METABOLIC PANEL
BUN: 14 mg/dL (ref 6–23)
CO2: 26 mEq/L (ref 19–32)
Calcium: 9.2 mg/dL (ref 8.4–10.5)
Chloride: 102 mEq/L (ref 96–112)
Creatinine, Ser: 0.77 mg/dL (ref 0.40–1.20)
GFR: 71.83 mL/min (ref >60.00)
Glucose, Bld: 108 mg/dL — ABNORMAL HIGH (ref 70–99)
Potassium: 4 mEq/L (ref 3.5–5.1)
Sodium: 137 mEq/L (ref 135–145)

## 2019-09-07 LAB — TSH: TSH: 3.58 u[IU]/mL (ref 0.35–4.50)

## 2019-09-07 LAB — BRAIN NATRIURETIC PEPTIDE: Pro B Natriuretic peptide (BNP): 1473 pg/mL — ABNORMAL HIGH (ref 0.0–100.0)

## 2019-09-07 LAB — SEDIMENTATION RATE: Sed Rate: 53 mm/hr — ABNORMAL HIGH (ref 0–30)

## 2019-09-07 NOTE — Patient Instructions (Signed)
GERD (REFLUX)  is an extremely common cause of respiratory symptoms just like yours , many times with no obvious heartburn at all.    It can be treated with medication, but also with lifestyle changes including elevation of the head of your bed (ideally with 6-8inch blocks under the headboard of your bed),  Smoking cessation, avoidance of late meals, excessive alcohol, and avoid fatty foods, chocolate, peppermint, colas, red wine, and acidic juices such as orange juice.  NO MINT OR MENTHOL PRODUCTS SO NO COUGH DROPS  USE SUGARLESS CANDY INSTEAD (Jolley ranchers or Stover's or Life Savers) or even ice chips will also do - the key is to swallow to prevent all throat clearing. NO OIL BASED VITAMINS - use powdered substitutes.  Avoid fish oil when coughing.    Please remember to go to the lab department   for your tests - we will call you with the results when they are available.  Please schedule a follow up office visit in 6 weeks, call sooner if needed           .

## 2019-09-07 NOTE — Progress Notes (Signed)
Subjective:     Patient ID: Julie Rice, female   DOB: 08/13/1936,   MRN: [id number]    Brief patient profile:  82 yowf never smoker dx RA followed [place] on nsaids with new doe around 2000 eval by Dr Jayme Cloud: inhaler helped some but never back to baseline then rx by [facility] 08/02/16 LAPAROSCOPIC REPAIR OF LARGE  HIATAL HERNIA LAPAROSCOPIC NISSEN FUNDOPLICATION >>  and dysphagia ever since with eval [name] 02/19/17 EGD dilated benign esoph stenosis and improved but  new cough /gag/vomit around Nov 04 2017 then abrupt pain under R breast x around 12/20/17 then cxr 12/22/17 c/w pna with cavitary changes on CT 12/25/17 so referred to pulmonary clinic 12/31/2017 by Dr  Kirtland Bouchard p rx with augmentin starting 12/25/17 for possible lung abscess.     History of Present Illness  12/31/2017 1st [facility] office visit/ Julie Rice   Chief Complaint  Patient presents with  . Pulmonary Consult    Referred by Dr. Amador Cunas. Pt c/o cough since Jan 2019. She states she has had SOB off and on for the past several yrs. She was recently dxed with PNA. Her cough is non prod and worse at night.   since abx started R side CP  improved, cough still a problem > beige esp at hs / still gag/ vomit with overt HB not on ppi daily ac Lower molar R dental work one month prior to onset of cp Sleeps on L side typically not on back  rec Augmentin 875 mg take one pill twice daily  X 10 days   Protonix 20 mg x 2 (40 mg called in)   Take 30- 60 min before your first and last meals of the day  GERD  diet Take delsym two tsp every 12 hours and supplement if needed with  tramadol 50 mg up to 1 every 4 hours     01/12/2018  f/u ov/Julie Rice re: lung abscess  "I'm no better at all"  Chief Complaint  Patient presents with  . Follow-up    SOB with activity and at rest, non-productive cough, chest pain bilateral sides behind breast,   Dyspnea:  No better Cough: no sputum production  Sleep: tramadol works well / only taking twice daily  (rec was for up to 1 q4)  Cp now 2/10 only feels it with deep breath  rec Take delsym two tsp every 12 hours and supplement if needed with  tramadol 50 mg up to 1 every 4 hours to suppress the urge to cough. Swallowing water or using ice chips/non mint and menthol containing candies (such as lifesavers or sugarless jolly ranchers) are also effective.  You should rest your voice and avoid activities that you know make you cough. Once you have eliminated the cough for 3 straight days try reducing the tramadol first,  then the delsym as tolerated. Please remember to go to the  x-ray department downstairs in the basement  for your tests - we will call you with the results when they are available. Clindamycin 150 mg four times a day x 10 days and then return with cxr      Add:  Changed to 300 mg tid and advised by phone     02/05/2018  f/u ov/Julie Rice re: final f/u re lung abscess from ? Asp pna  Chief Complaint  Patient presents with  . Follow-up    ER VISIT 3.31.19, NON PRODUCTIVE COUGH  R cp leveled off  Now can barely feel it with the  deepest breath Sob the same  At rest and worse walking  Cough is improved but still waking up with it and also some daytime but dry  N and V ? From clindamycin  Coughed so hard on 01/30/18 started to hurt L chest > to ER 02/01/18  New med from ER = zofran > eating and drinking now  Last clindamycin about a week prior to OV  rec Please see Dr [name] as soon as possible as I suspect your swallowing problem caused your lung problem which is slowly healing Stop your clindamycin  You will need a follow up cxr in 3 months - fine to let Dr Kirtland Bouchard do this  - return here if cough or pain with breathing gets worse with all active medications in hand including the over the counter ones  Admit date: 08/16/2019 Discharge date: 08/24/2019  Recommendations for Outpatient Follow-up:  1. Eat only sitting up at 90 degrees. 2. Follow up with PCP in 7-10 days. 3. Chemistry to be drawn  in 7 days and reported to PCP. 4. Patient to follow up with Dr. Eden Emms within 2 weeks. 5. Patient to follow up with Dr. Chales Abrahams within one month.     Follow-up Information    [facility] Follow up.   Specialty: Home Health Services Why: Registered Nurse, Physical and Occupational Therapy, Social Worker- office to call you with visit time.  Contact information: [address] [place] [address] [phone]          Discharge Diagnoses: Principal diagnosis is #1 1. Atrial fibrillation with RVR 2. Chest Pain 3. Intractable nausea and vomiting 4. Elevated troponin 5. Hypothyroidism 6. Dyslipidemia 7. Anemia 8. Hypertension 9. GERD 10. Prolonged QT interval 11. History of systolic CHF 12. Asthma  Discharge Condition: Fair  Disposition: Home  Diet recommendation: regular       Filed Weights   08/20/19 0410 08/21/19 0443 08/22/19 0559  Weight: 65.3 kg 64.6 kg 64.7 kg    History of present illness:   Ms.Cofferlooks to have been seen very remotely (1990's) by Dr. Eden Emms and not since then. Recently referred back for some reports of CP and palpitations, though presented to the hospital last month prior To her appointment date. She was noted to have new reduced LVEF 40%, CHF, was in SR w/PVCs. Cardiology noted she had a 4 day monitor via her PMD noting intermittent runs of SVT with the longest being 13 beats. She was diuresed, underwent R/LHC Widely patent coronary arteries,Low right heart and LV filling pressures During her stay she had an SVT that responded to adenosine She was discharged 07/27/2019  She was re-admitted 08/07/2019 with SOB, hypoxic resp failure, found with aspiration pneumonia.Barium swallow study showed significant narrowing of the upper part of the esophagus but no esophageal stricture. Underwent EGD with finding of benign-appearing esophageal stenosis.Status post fundoplication, Botox injection,  balloon dilation. I do not see that she had any cardiac issues during this hospitalization  Admitted to [facility] this admission 08/16/2019 with c/o on/off CP, HHRN noted pulse irregular, ranging 60-140 and recommended she go to the ER. She was found in AFib w/RVR New echo this admission noted EF up some to 40-45%, initially placed on dilt though changed to BB, and heparin gtt for a/c Looks like she is planned for TEE/DCCV, though ? If pending GI 1st given her history and ongoing nausea, as well as any anticoagulation recommendations, concerns, for final anticoagulation plan  EP is asked  to weigh in on AAD options, noting severely dilated LA, likely to need drug to help maintain SR. Rhythm control preferred strategy with reduced LVEF ad recent CHF exacerbation.  Hospital Course:  83 y.o.femalewith medical history significant of hypertension, hyperlipidemia, systolic CHF, fibromyalgia, anemia, esophageal strictures/p balloon dilation, large symptomatic hiatal hernia status post fundoplication, chronic dysphagia, asthma/COPD on oxygen at 2 L at home, CAD, TIA, bipolar disorderfibromyalgia, rheumatoid arthritis, TIA, chronic urinary incontinence.   She presented with heart rate 60s-140at home per home health nurse. Patient has not filled her medications since her discharge with still having some intermittent chest pain and she was told to come to emergency department. She reports while hospitalized she has an episode of tachycardia with HR p to 170's she was given a medicine and then her HR has come down. She has no known hx of A.fib  She took 2 nitro and 3/24 of aspirin and presented to emergency department.Chest pain report on and off for weeks, is worse with swallowing. Has been swallowing soft food ok.Reported burning pain in her chest radiating to her back and arms.She felt very weak and fell back wards no LOC.No fever no chills.Last night she felt sweaty her temp was  99.9.Reports Shortness of breath since last summerbut hasbeen getting progressively worse.  Relative recent history: Initially admitted on 10/3/20200with shortness of breath and chest pain was found to have lower lobe pneumonia thought to be secondary to aspiration COVID negative treated with broad-spectrum antibiotics there was worrisome for esophageal stenosis GI was consulted and she underwent balloon dilatation and Botox injection Physical therapy seen her and recommended home health and discharge and she was discharged home on 9 October.  EKG showed no evidence of ischemic changes thought to be secondary to demand ischemia.     09/07/2019  f/u ov/Julie Rice re:  Chief Complaint  Patient presents with  . Hospitalization Follow-up    Pt states having SOB when she first wakes up in the am's. She is using o2 with sleep 2lpm. She is using her albuterol inhaler once per day on average.   Dyspnea:  Better p 1st hour but still  MMRC3 = can't walk 100 yards even at a slow pace at a flat grade s stopping due to sob  / hc parking  Cough: 3am couple times a week x months  Sleeping: rotated L side down two pillows  SABA use: not sure it's helping  02: 2lpm hs only    No obvious day to day or daytime variability or assoc excess/ purulent sputum or mucus plugs or hemoptysis or cp or chest tightness, subjective wheeze or overt sinus or hb symptoms.     Also denies any obvious fluctuation of symptoms with weather or environmental changes or other aggravating or alleviating factors except as outlined above   No unusual exposure hx or h/o childhood pna/ asthma or knowledge of premature birth.  Current Allergies, Complete Past Medical History, Past Surgical History, Family History, and Social History were reviewed in [employer] record.  ROS  The following are not active complaints unless bolded Hoarseness, sore throat, dysphagia, dental problems, itching, sneezing,  nasal  congestion or discharge of excess mucus or purulent secretions, ear ache,   fever, chills, sweats, unintended wt loss or wt gain, classically pleuritic or exertional cp,  orthopnea pnd or arm/hand swelling  or leg swelling, presyncope, palpitations, abdominal pain, anorexia, nausea, vomiting, diarrhea  or change in bowel habits or change in bladder habits, change in  stools or change in urine, dysuria, hematuria,  rash, arthralgias, visual complaints, headache, numbness, weakness or ataxia or problems with walking or coordination,  change in mood or  memory.        Current Meds  Medication Sig  . acetaminophen (TYLENOL) 325 MG tablet Take 2 tablets (650 mg total) by mouth every 4 (four) hours as needed for headache or mild pain.  Marland Kitchen albuterol (VENTOLIN HFA) 108 (90 Base) MCG/ACT inhaler Inhale 2 puffs into the lungs every 6 (six) hours as needed for wheezing or shortness of breath.  Marland Kitchen alum & mag hydroxide-simeth (MAALOX/MYLANTA) 200-200-20 MG/5ML suspension Take 30 mLs by mouth every 4 (four) hours as needed for indigestion.  Marland Kitchen amiodarone (PACERONE) 200 MG tablet Take 1 tablet (200 mg total) by mouth 2 (two) times daily.  Marland Kitchen amLODipine (NORVASC) 2.5 MG tablet Take 2.5 mg by mouth daily.  Marland Kitchen apixaban (ELIQUIS) 5 MG TABS tablet Take 1 tablet (5 mg total) by mouth 2 (two) times daily.  . carvedilol (COREG) 6.25 MG tablet Take 1 tablet (6.25 mg total) by mouth 2 (two) times daily with a meal.  . clonazePAM (KLONOPIN) 0.5 MG tablet TAKE 1 TABLET BY MOUTH TWICE A DAY AS NEEDED FOR ANXIETY (Patient taking differently: Take 0.5 mg by mouth 2 (two) times daily as needed for anxiety. )  . gabapentin (NEURONTIN) 100 MG capsule TAKE 1 CAPSULE BY MOUTH THREE TIMES A DAY (Patient taking differently: Take 100 mg by mouth 3 (three) times daily. TAKE 1 CAPSULE BY MOUTH THREE TIMES A DAY)  . loperamide (IMODIUM) 2 MG capsule Take 1 capsule (2 mg total) by mouth as needed for diarrhea or loose stools.  . Multiple Vitamin  (MULTIVITAMIN) tablet Take 1 tablet by mouth daily. Vitamin supplement.  Marland Kitchen omeprazole (PRILOSEC) 20 MG capsule Take 1 capsule (20 mg total) by mouth daily.  . sacubitril-valsartan (ENTRESTO) 24-26 MG Take 1 tablet by mouth 2 (two) times daily.  Marland Kitchen spironolactone (ALDACTONE) 25 MG tablet Take 0.5 tablets (12.5 mg total) by mouth daily.  . sucralfate (CARAFATE) 1 g tablet Take 1 tablet (1 g total) by mouth 4 (four) times daily. Dissolve tablet in 10 ml warm water (Patient taking differently: Take 1 g by mouth 4 (four) times daily as needed (heartburn). Dissolve tablet in 10 ml warm water)  . tetrahydrozoline 0.05 % ophthalmic solution Place 1 drop into both eyes daily.                      Objective:   Physical Exam  amb wf nad  BP 124/60 (BP Location: Left Arm, Cuff Size: Normal)   Pulse 68   Temp (!) 97.2 F (36.2 C) (Temporal)   Ht 5\' 3"  (1.6 m)   Wt 136 lb (61.7 kg)   SpO2 97% Comment: on RA  BMI 24.09 kg/m     09/07/2019        136  02/05/2018         152  01/12/2018        159   12/31/17 160 lb 9.6 oz (72.8 kg)  12/29/17 162 lb (73.5 kg)  12/22/17 164 lb 4 oz (74.5 kg)    HEENT : pt wearing mask not removed for exam due to covid -19 concerns.    NECK :  without JVD/Nodes/TM/ nl carotid upstrokes bilaterally   LUNGS: no acc muscle use,  Nl contour chest with a few insp crackles L base without cough on insp or exp  maneuvers   CV:  IRIR   no s3 or murmur or increase in P2, and  Bilateral low ext pitting edema/ wearing elastic hose  ABD:  soft and nontender with nl inspiratory excursion in the supine position. No bruits or organomegaly appreciated, bowel sounds nl  MS:  Nl gait/ ext warm without deformities, calf tenderness, cyanosis or clubbing No obvious joint restrictions   SKIN: warm and dry without lesions    NEURO:  alert, approp, nl sensorium with  no motor or cerebellar deficits apparent.       I personally reviewed images and agree with radiology  impression as follows:   Chest CTa  08/17/2019  No evidence of aortic aneurysm or dissection. No evidence of pulmonary embolus. Moderate bilateral pleural effusions. Compressive atelectasis in the lower lobes. Scattered reticulonodular densities and ground-glass nodular densities throughout the lungs are stable. Dilated, fluid-filled esophagus is stable. No acute findings in the abdomen or pelvis. Aortic atherosclerosis, cardiomegaly.     Labs ordered/ reviewed:      Chemistry      Component Value Date/Time   NA 137 09/07/2019 1429   K 4.0 09/07/2019 1429   CL 102 09/07/2019 1429   CO2 26 09/07/2019 1429   BUN 14 09/07/2019 1429   CREATININE 0.77 09/07/2019 1429      Component Value Date/Time   CALCIUM 9.2 09/07/2019 1429   ALKPHOS 70 09/07/2019 1429   AST 15 09/07/2019 1429   ALT 10 09/07/2019 1429   BILITOT 0.6 09/07/2019 1429      Albumin                      3.9                09/07/2019       Lab Results  Component Value Date   WBC 17.5 (H) 09/07/2019   HGB 11.0 (L) 09/07/2019   HCT 35.1 (L) 09/07/2019   MCV 90.6 09/07/2019   PLT 218.0 09/07/2019       EOS                                                               0.3                                    09/07/2019     Lab Results  Component Value Date   TSH 3.58 09/07/2019     Lab Results  Component Value Date   PROBNP 1,473.0 (H) 09/07/2019       Lab Results  Component Value Date   ESRSEDRATE 53 (H) 09/07/2019   ESRSEDRATE 11 05/03/2019   ESRSEDRATE 23 (H) 12/08/2015          Assessment:

## 2019-09-08 ENCOUNTER — Encounter: Payer: Self-pay | Admitting: Internal Medicine

## 2019-09-08 ENCOUNTER — Ambulatory Visit: Payer: Medicare Other | Admitting: Cardiology

## 2019-09-08 ENCOUNTER — Encounter: Payer: Self-pay | Admitting: Cardiology

## 2019-09-08 ENCOUNTER — Telehealth: Payer: Self-pay | Admitting: Internal Medicine

## 2019-09-08 VITALS — BP 128/72 | HR 68 | Ht 63.0 in | Wt 137.0 lb

## 2019-09-08 DIAGNOSIS — I10 Essential (primary) hypertension: Secondary | ICD-10-CM

## 2019-09-08 DIAGNOSIS — I2510 Atherosclerotic heart disease of native coronary artery without angina pectoris: Secondary | ICD-10-CM

## 2019-09-08 DIAGNOSIS — R9431 Abnormal electrocardiogram [ECG] [EKG]: Secondary | ICD-10-CM | POA: Diagnosis not present

## 2019-09-08 DIAGNOSIS — J90 Pleural effusion, not elsewhere classified: Secondary | ICD-10-CM | POA: Insufficient documentation

## 2019-09-08 DIAGNOSIS — I5023 Acute on chronic systolic (congestive) heart failure: Secondary | ICD-10-CM

## 2019-09-08 DIAGNOSIS — I480 Paroxysmal atrial fibrillation: Secondary | ICD-10-CM

## 2019-09-08 MED ORDER — FUROSEMIDE 40 MG PO TABS: 40.0000 mg | ORAL_TABLET | Freq: Every day | ORAL | 3 refills | Status: DC

## 2019-09-08 MED ORDER — AMIODARONE HCL 200 MG PO TABS: 200.0000 mg | ORAL_TABLET | Freq: Every day | ORAL | 3 refills | Status: DC

## 2019-09-08 NOTE — Assessment & Plan Note (Addendum)
PFTs 04/22/2019 min airflow obst  Chest CTa  08/17/2019 No evidence of aortic aneurysm or dissection. No evidence of pulmonary embolus. Moderate bilateral pleural effusions. Compressive atelectasis in the lower lobes.  Scattered reticulonodular densities and ground-glass nodular densities throughout the lungs are stable. Dilated, fluid-filled esophagus is stable. Echo 08/19/2019 1. Left ventricular ejection fraction, by visual estimation, is 25 to 30%. The left ventricle has severely decreased function. There is no left ventricular hypertrophy.  2. Global right ventricle has normal systolic function.The right ventricular size is normal.  3. Left atrial size was severely dilated.  4. Right atrial size was moderately dilated.  Amiodarone started 08/23/2019  - 09/07/2019   Walked RA x one lap =  approx 250 ft - stopped due to  Sob with sats 100%   Clearly the sob is due to elevated L > R sided heart pressures as are the effusions and she has adequate sats at rest and with exertion/ will need to be monitored on amiodarone but note nl pfts baseline reduces risk somewhat.

## 2019-09-08 NOTE — Patient Instructions (Addendum)
Medication Instructions:  1.) START: Lasix 40 mg once a day for 4 days then [place] only as needed for shortness of breath, weight gain or swelling   2.) DECREASE: Amiodarone to 200 mg once a day  *If you need a refill on your cardiac medications before your next appointment, please call your pharmacy*  Lab Work: None   If you have labs (blood work) drawn today and your tests are completely normal, you will receive your results only by: Marland Kitchen MyChart Message (if you have MyChart) OR . A paper copy in the mail If you have any lab test that is abnormal or we need to change your treatment, we will call you to review the results.  Testing/Procedures: None   Follow-Up: You are scheduled to see Pecolia Ades, NP on 09/22/2019 @ 2:00 PM   Other Instructions  Do the following things EVERY DAY:   1. Weigh yourself EVERY morning after you go to the bathroom but before you eat or drink anything. Write this number down in a weight log/diary. If you gain 3 pounds overnight or 5 pounds in a week, call the office.   2. Take your medicines as prescribed. If you have concerns about your medications, please call us before you stop taking them.    3. Eat low salt foods-Limit salt (sodium) to 2000 mg per day. This will help prevent your body from holding onto fluid. Read food labels as many processed foods have a lot of sodium, especially canned goods and prepackaged meats. If you would like some assistance choosing low sodium foods, we would be happy to set you up with a nutritionist.   4. Stay as active as you can everyday. Staying active will give you more energy and make your muscles stronger. Start with 5 minutes at a time and work your way up to 30 minutes a day. Break up your activities--do some in the morning and some in the afternoon. Start with 3 days per week and work your way up to 5 days as you can.  If you have chest pain, feel short of breath, dizzy, or lightheaded, STOP. If you don't feel better  after a short rest, call 911. If you do feel better, call the office to let us know you have symptoms with exercise.   5. Limit all fluids for the day to less than 2 liters. Fluid includes all drinks, coffee, juice, ice chips, soup, jello, and all other liquids.

## 2019-09-08 NOTE — Telephone Encounter (Signed)
Tanda Rockers, MD sent to Julie Rice, [place]        Call patient : Studies are as expected, no change in recs - discuss with cards at [facility] as planned    Spoke with pt and notified of results per Dr. Melvyn Novas. Pt verbalized understanding and denied any questions.

## 2019-09-08 NOTE — Progress Notes (Addendum)
Cardiology Office Note:    Date:  09/08/2019   ID:  Margret ChanceDelores C Ferencz, DOB 10/18/1936, MRN [id number]  PCP:  [name], [name], NP  Cardiologist:  Charlton HawsPeter Nishan, MD  Referring MD: [name], [name], NP   Chief Complaint  Patient presents with  . Hospitalization Follow-up  . Atrial Fibrillation  . Congestive Heart Failure    History of Present Illness:    Letita C Hinchliffe is a 83 y.o. female with a past medical history significant for hypertension, hyperlipidemia, systolic CHF, fibromyalgia, anemia, esophageal stricture status post balloon dilatation, large symptomatic hiatal hernia status post fundoplication, chronic dysphagia, asthma/COPD on oxygen at 2 L at home, bipolar disorder, rheumatoid arthritis, TIA and chronic urinary incontinence.  Right and left heart cath on 07/26/2019 showed only mild nonobstructive disease and low right heart pressures.  The patient was admitted to the hospital 08/16/2019 with complaints of off-and-on chest pain and noted to have irregular pulse by home health nurse.  She was found to be in atrial fibrillation with RVR.  Echocardiogram showed EF 40-45%.  She was initially placed on diltiazem but changed to beta-blocker.  She had severe symptoms she was placed on amiodarone for rhythm control.  She was planned for electrical cardioversion but converted to sinus rhythm prior. CHADSVASC score is 7. She was started on Eliquis for stroke risk reduction.  Her QTC was elevated at 535 in the hospital and amiodarone was reduced and she was also taken off of Zofran.  Ms. Judeth HornCoffer is here today with her sister in law. She has had in home well care to check on her and guide exercises. She feels like she is slowly getting stronger. She went to Dr. Sherene SiresWert yesterday who did some labs including a BNP which was elevated.  The patient reports that she monitors Daily wts, she has been going down slowly down from 148 to 136 pounds yest. Her legs appear puffy but there is no pitting. Pt states  that leg edema is down from her usual.   She has had no palpitations or feelings of return of afib since hosp discharge.   No chest pain/pressure. No orthopnea. She does note DOE especially in the morning. She uses oxygen during the night. She also uses her inhaler first thing in the mornings, which helps only a little.   Cardiac studies   TEE 08/19/2019 IMPRESSIONS   1. Left ventricular ejection fraction, by visual estimation, is 25 to 30%. The left ventricle has severely decreased function. There is no left ventricular hypertrophy.  2. Global right ventricle has normal systolic function.The right ventricular size is normal.  3. Left atrial size was severely dilated.  4. Right atrial size was moderately dilated.  5. Mild mitral annular calcification.  6. The mitral valve is normal in structure. Moderate mitral valve regurgitation.  7. The tricuspid valve is normal in structure. Tricuspid valve regurgitation moderate.  8. The aortic valve is tricuspid Aortic valve regurgitation is mild by color flow Doppler. Mild aortic valve sclerosis without stenosis.  9. The pulmonic valve was grossly normal. Pulmonic valve regurgitation is mild by color flow Doppler. 10. Mild plaque invoving the descending aorta. 11. Severe global reduction in LV systolic function; biatrial enlargement; atrial septal aneurysm; no LAA thrombus; mild AI; moderate MR and TR.   Echocardiogram 08/17/2019 IMPRESSIONS  1. Left ventricular ejection fraction, by visual estimation, is 45 to 50%. Assessment of LV function complicated by atrial fibrillation, but overall appears mildly decreased global systolic function. Normal left ventricular size.  There is mildly  increased left ventricular hypertrophy.  2. Left ventricular diastolic Doppler parameters are indeterminate pattern of LV diastolic filling.  3. Global right ventricle has normal systolic function.The right ventricular size is normal. No increase in right  ventricular wall thickness.  4. Left atrial size was severely dilated.  5. Right atrial size was moderately dilated.  6. Moderate pleural effusion in the left lateral region.  7. Trivial pericardial effusion is present.  8. The mitral valve is abnormal. Mild to moderate mitral valve regurgitation. EROA 0.15, RV 23cc  9. The tricuspid valve is normal in structure. Tricuspid valve regurgitation is mild. 10. The aortic valve is tricuspid Aortic valve regurgitation is mild by color flow Doppler. 11. Mildly elevated pulmonary artery systolic pressure. RVSP 42 mmHg 12. The inferior vena cava is dilated in size with >50% respiratory variability, suggesting right atrial pressure of 8 mmHg.  RIGHT/LEFT HEART CATH AND CORONARY ANGIOGRAPHY  07/26/2019  Conclusion  Right dominant coronary anatomy  Widely patent left main  30 to 40% mid LAD  Widely patent circumflex  Luminal irregularities in a dominant right coronary.  Right heart pressures are relatively low.  Pulmonary wedge pressure mean is 2 mmHg.  LVEDP 4 mmHg.  Left ventriculography by hand-injection was not helpful.  From images obtained, LV function appears relatively normal.  RECOMMENDATIONS:  Per treating team.  Relatively volume contracted and therefore we will give normal saline 100 cc/h for the next 4 hours.     Past Medical History:  Diagnosis Date  . Anemia   . Anxiety   . Bipolar disorder (HCC)   . Blood transfusion 1991   autologous pts own blood given   . CAD (coronary artery disease)   . Cataract   . Chest pain    "@ rest, lying down, w/exertion"  . Chronic back pain    "mostly lower back but I do have upper back pain regularly" (04/06/2018)  . COPD (chronic obstructive pulmonary disease) (HCC)   . Depression   . Esophageal stricture   . Fibromyalgia   . GERD (gastroesophageal reflux disease)   . Heart murmur    "slight" (04/06/2018)  . Hiatal hernia   . Hyperlipidemia    Patient denies  . Hypertension    . Internal hemorrhoids   . Lumbago   . Mitral regurgitation   . Osteoarthritis   . Osteoporosis   . Pneumonia ~ 01/2018  . PONV (postoperative nausea and vomiting)    severe ponv, "in the past" (04/06/2018)  . Rectal bleeding   . Rheumatoid arthritis (HCC)   . Spondylosis   . TIA (transient ischemic attack) 2013  . Urinary incontinence    wears depends    Past Surgical History:  Procedure Laterality Date  . ABDOMINAL HYSTERECTOMY  1982  . BACK SURGERY    . BALLOON DILATION N/A 09/21/2018   Procedure: BALLOON DILATION;  Surgeon: Hilarie Fredrickson, MD;  Location: [facility]COPY;  Service: Endoscopy;  Laterality: N/A;  . BALLOON DILATION N/A 08/12/2019   Procedure: BALLOON DILATION;  Surgeon: Lynann Bologna, MD;  Location: [facility] ENDOSCOPY;  Service: Endoscopy;  Laterality: N/A;  . BIOPSY  08/12/2019   Procedure: BIOPSY;  Surgeon: Lynann Bologna, MD;  Location: [facility] ENDOSCOPY;  Service: Endoscopy;;  . BLADDER SUSPENSION  1980's  . BOTOX INJECTION N/A 09/21/2018   Procedure: BOTOX INJECTION;  Surgeon: Hilarie Fredrickson, MD;  Location: [facility] ENDOSCOPY;  Service: Endoscopy;  Laterality: N/A;  . BOTOX INJECTION N/A 08/12/2019   Procedure:  BOTOX INJECTION;  Surgeon: Lynann BolognaGupta, Rajesh, MD;  [facility];  Service: Endoscopy;  Laterality: N/A;  . CATARACT EXTRACTION W/ INTRAOCULAR LENS  IMPLANT, BILATERAL Bilateral 2010  . DILATION AND CURETTAGE OF UTERUS  1961  . ESOPHAGEAL MANOMETRY N/A 03/25/2018   Procedure: ESOPHAGEAL MANOMETRY (EM);  Surgeon: Napoleon FormNandigam, Kavitha V, MD;  Location: [facility] ENDOSCOPY;  Service: Endoscopy;  Laterality: N/A;  . ESOPHAGOGASTRODUODENOSCOPY (EGD) WITH PROPOFOL N/A 04/07/2018   Procedure: ESOPHAGOGASTRODUODENOSCOPY (EGD) WITH PROPOFOL;  Surgeon: Sherrilyn Ristanis, Henry L III, MD;  [facility];  Service: Gastroenterology;  Laterality: N/A;  . ESOPHAGOGASTRODUODENOSCOPY (EGD) WITH PROPOFOL N/A 09/21/2018   Procedure: ESOPHAGOGASTRODUODENOSCOPY (EGD) WITH PROPOFOL;  Surgeon: Hilarie FredricksonPerry, John N,  MD;  Location: [facility] ENDOSCOPY;  Service: Endoscopy;  Laterality: N/A;  . ESOPHAGOGASTRODUODENOSCOPY (EGD) WITH PROPOFOL N/A 08/12/2019   Procedure: ESOPHAGOGASTRODUODENOSCOPY (EGD) WITH PROPOFOL;  Surgeon: Lynann BolognaGupta, Rajesh, MD;  [facility];  Service: Endoscopy;  Laterality: N/A;  . HERNIA REPAIR    . HIATAL HERNIA REPAIR N/A 08/02/2016   Procedure: LAPAROSCOPIC REPAIR OF LARGE  HIATAL HERNIA;  Surgeon: Glenna FellowsBenjamin Hoxworth, MD;  Location: [facility] ORS;  Service: General;  Laterality: N/A;  . JOINT REPLACEMENT    . LAPAROSCOPIC NISSEN FUNDOPLICATION N/A 08/02/2016   Procedure: LAPAROSCOPIC NISSEN FUNDOPLICATION;  Surgeon: Glenna FellowsBenjamin Hoxworth, MD;  Location: [facility] ORS;  Service: General;  Laterality: N/A;  . LUMBAR LAMINECTOMY  1990; 1994; 4782;9562; 1997;2000   "I've got 2 stainless steel rods; 6 screws; 2 ray cages"took bone from right hip to put in back  . RIGHT/LEFT HEART CATH AND CORONARY ANGIOGRAPHY N/A 07/26/2019   Procedure: RIGHT/LEFT HEART CATH AND CORONARY ANGIOGRAPHY;  Surgeon: Lyn RecordsSmith, Henry W, MD;  Location: [facility] INVASIVE CV LAB;  Service: Cardiovascular;  Laterality: N/A;  . TEE WITHOUT CARDIOVERSION N/A 08/19/2019   Procedure: TRANSESOPHAGEAL ECHOCARDIOGRAM (TEE);  Surgeon: Lewayne Buntingrenshaw, Brian S, MD;  [facility];  Service: Cardiovascular;  Laterality: N/A;  . TONSILLECTOMY AND ADENOIDECTOMY  1945  . TOTAL KNEE ARTHROPLASTY Right ~ 1996  . TUBAL LIGATION  ~ 1976    Current Medications: Current Meds  Medication Sig  . acetaminophen (TYLENOL) 325 MG tablet Take 2 tablets (650 mg total) by mouth every 4 (four) hours as needed for headache or mild pain.  Marland Kitchen. albuterol (VENTOLIN HFA) 108 (90 Base) MCG/ACT inhaler Inhale 2 puffs into the lungs every 6 (six) hours as needed for wheezing or shortness of breath.  Marland Kitchen. alum & mag hydroxide-simeth (MAALOX/MYLANTA) 200-200-20 MG/5ML suspension Take 30 mLs by mouth every 4 (four) hours as needed for indigestion.  Marland Kitchen. amiodarone (PACERONE) 200 MG tablet Take 1  tablet (200 mg total) by mouth daily.  Marland Kitchen. amLODipine (NORVASC) 2.5 MG tablet Take 2.5 mg by mouth daily.  Marland Kitchen. apixaban (ELIQUIS) 5 MG TABS tablet Take 1 tablet (5 mg total) by mouth 2 (two) times daily.  . carvedilol (COREG) 6.25 MG tablet Take 1 tablet (6.25 mg total) by mouth 2 (two) times daily with a meal.  . clonazePAM (KLONOPIN) 0.5 MG tablet TAKE 1 TABLET BY MOUTH TWICE A DAY AS NEEDED FOR ANXIETY (Patient taking differently: Take 0.5 mg by mouth 2 (two) times daily as needed for anxiety. )  . gabapentin (NEURONTIN) 100 MG capsule TAKE 1 CAPSULE BY MOUTH THREE TIMES A DAY (Patient taking differently: Take 100 mg by mouth 3 (three) times daily. TAKE 1 CAPSULE BY MOUTH THREE TIMES A DAY)  . loperamide (IMODIUM) 2 MG capsule Take 1 capsule (2 mg total) by mouth as needed for diarrhea  or loose stools.  . Multiple Vitamin (MULTIVITAMIN) tablet Take 1 tablet by mouth daily. Vitamin supplement.  Marland Kitchen omeprazole (PRILOSEC) 20 MG capsule Take 1 capsule (20 mg total) by mouth daily.  . sacubitril-valsartan (ENTRESTO) 24-26 MG Take 1 tablet by mouth 2 (two) times daily.  Marland Kitchen spironolactone (ALDACTONE) 25 MG tablet Take 0.5 tablets (12.5 mg total) by mouth daily.  . sucralfate (CARAFATE) 1 g tablet Take 1 tablet (1 g total) by mouth 4 (four) times daily. Dissolve tablet in 10 ml warm water (Patient taking differently: Take 1 g by mouth 4 (four) times daily as needed (heartburn). Dissolve tablet in 10 ml warm water)  . tetrahydrozoline 0.05 % ophthalmic solution Place 1 drop into both eyes daily.  . [DISCONTINUED] amiodarone (PACERONE) 200 MG tablet Take 1 tablet (200 mg total) by mouth 2 (two) times daily.     Allergies:   Morphine and Tape   Social History   Socioeconomic History  . Marital status: Widowed    Spouse name: Not on file  . Number of children: 2  . Years of education: Not on file  . Highest education level: Not on file  Occupational History    Employer: RETIRED  Social Needs  .  Financial resource strain: Not on file  . Food insecurity    Worry: Not on file    Inability: Not on file  . Transportation needs    Medical: Not on file    Non-medical: Not on file  Tobacco Use  . Smoking status: Never Smoker  . Smokeless tobacco: Never Used  Substance and Sexual Activity  . Alcohol use: Never    Frequency: Never  . Drug use: Never  . Sexual activity: Not Currently  Lifestyle  . Physical activity    Days per week: Not on file    Minutes per session: Not on file  . Stress: Not on file  Relationships  . Social Herbalist on phone: Not on file    Gets together: Not on file    Attends religious service: Not on file    Active member of club or organization: Not on file    Attends meetings of clubs or organizations: Not on file    Relationship status: Not on file  Other Topics Concern  . Not on file  Social History Narrative   Retired    Widowed     Family History: The patient's family history includes Heart attack in her brother; Heart disease in her brother; Heart disease (age of onset: 54) in her father; Hypertension in her mother; Kidney disease in her mother; Suicidality in her son. There is no history of Colon cancer, Esophageal cancer, Pancreatic cancer, Rectal cancer, or Stomach cancer. ROS:   Please see the history of present illness.     All other systems reviewed and are negative.   EKG:  EKG is ordered today.  The ekg ordered today demonstrates normal sinus rhythm, 68 bpm, bbab.  QTc 499  Recent Labs: 08/17/2019: B Natriuretic Peptide 599.2 08/23/2019: Magnesium 1.8 09/07/2019: ALT 10; BUN 14; Creatinine, Ser 0.77; Hemoglobin 11.0; Platelets 218.0; Potassium 4.0; Pro B Natriuretic peptide (BNP) 1,473.0; Sodium 137; TSH 3.58   Recent Lipid Panel    Component Value Date/Time   CHOL 127 08/19/2019 0429   TRIG 103 08/19/2019 0429   HDL 42 08/19/2019 0429   CHOLHDL 3.0 08/19/2019 0429   VLDL 21 08/19/2019 0429   LDLCALC 64  08/19/2019 0429   LDLDIRECT 122.2  06/01/2013 0944    Physical Exam:    VS:  BP 128/72   Pulse 68   Ht 5\' 3"  (1.6 m)   Wt 137 lb (62.1 kg)   SpO2 96%   BMI 24.27 kg/m     Wt Readings from Last 6 Encounters:  09/08/19 137 lb (62.1 kg)  09/07/19 136 lb (61.7 kg)  08/22/19 142 lb 11.2 oz (64.7 kg)  08/13/19 65 lb 11.2 oz (29.8 kg)  07/27/19 140 lb 6.9 oz (63.7 kg)  07/07/19 144 lb (65.3 kg)     Physical Exam  Constitutional: She is oriented to person, place, and time. She appears well-developed and well-nourished. No distress.  HENT:  Head: Normocephalic and atraumatic.  Neck: Normal range of motion. Neck supple. No JVD present.  Cardiovascular: Normal rate, regular rhythm and intact distal pulses. Exam reveals no gallop and no friction rub.  Murmur heard. High-pitched blowing holosystolic murmur is present with a grade of 2/6 at the apex. Pulmonary/Chest: Effort normal. No respiratory distress. She has no wheezes. She has rales.  Goals noted in left posterior base  Abdominal: Soft. Bowel sounds are normal.  Musculoskeletal: Normal range of motion.     Comments: Lower legs with mild puffiness, nonpitting.  Neurological: She is alert and oriented to person, place, and time.  Skin: Skin is warm and dry.  Psychiatric: She has a normal mood and affect. Her behavior is normal. Judgment and thought content normal.  Vitals reviewed.   ASSESSMENT:    1. Paroxysmal atrial fibrillation (HCC)   2. Prolonged Q-T interval on ECG   3. Coronary artery disease involving native coronary artery of native heart without angina pectoris   4. Acute on chronic systolic congestive heart failure (HCC)   5. Essential hypertension    PLAN:    In order of problems listed above:  1. New onset atrial fibrillation:  -Recent hospitalization with finding of new onset atrial fibrillation with RVR.  The patient was very symptomatic so rhythm control strategy was adopted and she has been placed on  amiodarone.  -The patient continues in sinus rhythm and has not had no perceived recurrences of A. fib.  Her QTC is 499 today.  We will decrease amiodarone to 200 mg daily for maintenance dose. -CHADSVASC score is 7.  Patient was started on Eliquis.  No unusual bleeding.  I reviewed in detail the purpose of anticoagulation in the setting of atrial fibrillation for stroke risk reduction with the patient.   2.Prolonged QTc:  -QTC 535 on EKG from 10/17 in the hospital.  Amiodarone was reduced, also taken off standing Zofran.   QTC was 505 on 10/19.  Today QTC is 499 on EKG -Pt advised to limit use of tramadol and zofran, currently not using.   3. Nonobstructive CAD:  -by cardiac catheterization on September 21.  4. Acute on chronic systolic heart failure:  -LVEF 25 to 30% by recent TEE.   TEE just 3 days priorshowed EF 45-50%.   -Now on coreg, Entresto, spiro.  -Labs done yesterday showed normal potassium of 4.0 and stable renal function with serum creatinine of 0.77. -BNP done yesterday was elevated at 1473 (which can be elevated with Entresto so it is unclear if this is actually an elevation or her new baseline) -Patient has no significant edema or orthopnea and her weight has been steadily declining, however she does have crackles in her left lung base and DOE in the mornings. -With elevated BNP, DOE and lung crackles  I feel that she needs some diuresis.  I consider just increasing her Entresto to the next dose due to its good diuretic properties versus using Lasix.  I feel that she needs some more focused diuresis at this time and will provide Lasix 40 mg daily x4 days.  She can also use Lasix as needed after that for increased weight, edema, shortness of breath. -I will see her back in the office in 2 weeks with labs and hopefully uptitrate her Entresto.  5. Chronic Nausea/dysphagia:  -Complicated history. She had esophageal dilatation and botox during recent hospitalization.  She  reports that her swallowing is better.  -Followed by GI, on PPI.    6. NSVT:  -Patient had some episodes of NSVT in the hospital on telemetry.  On beta-blocker. No perceived recurrences.    Medication Adjustments/Labs and Tests Ordered: Current medicines are reviewed at length with the patient today.  Concerns regarding medicines are outlined above. Labs and tests ordered and medication changes are outlined in the patient instructions below:  Patient Instructions  Medication Instructions:  1.) START: Lasix 40 mg once a day for 4 days then [place] only as needed for shortness of breath, weight gain or swelling   2.) DECREASE: Amiodarone to 200 mg once a day  *If you need a refill on your cardiac medications before your next appointment, please call your pharmacy*  Lab Work: None   If you have labs (blood work) drawn today and your tests are completely normal, you will receive your results only by: Marland Kitchen MyChart Message (if you have MyChart) OR . A paper copy in the mail If you have any lab test that is abnormal or we need to change your treatment, we will call you to review the results.  Testing/Procedures: None   Follow-Up: You are scheduled to see Lizabeth Leyden, NP on 09/22/2019 @ 2:00 PM   Other Instructions  Do the following things EVERY DAY:   1. Weigh yourself EVERY morning after you go to the bathroom but before you eat or drink anything. Write this number down in a weight log/diary. If you gain 3 pounds overnight or 5 pounds in a week, call the office.   2. Take your medicines as prescribed. If you have concerns about your medications, please call us before you stop taking them.    3. Eat low salt foods-Limit salt (sodium) to 2000 mg per day. This will help prevent your body from holding onto fluid. Read food labels as many processed foods have a lot of sodium, especially canned goods and prepackaged meats. If you would like some assistance choosing low sodium foods, we would be  happy to set you up with a nutritionist.   4. Stay as active as you can everyday. Staying active will give you more energy and make your muscles stronger. Start with 5 minutes at a time and work your way up to 30 minutes a day. Break up your activities--do some in the morning and some in the afternoon. Start with 3 days per week and work your way up to 5 days as you can.  If you have chest pain, feel short of breath, dizzy, or lightheaded, STOP. If you don't feel better after a short rest, call 911. If you do feel better, call the office to let us know you have symptoms with exercise.   5. Limit all fluids for the day to less than 2 liters. Fluid includes all drinks, coffee, juice, ice chips, soup, jello, and  all other liquids.      Signed, Berton Bon, NP  09/08/2019 5:22 PM    [facility]

## 2019-09-08 NOTE — Progress Notes (Signed)
LMTCB

## 2019-09-08 NOTE — Assessment & Plan Note (Addendum)
She does have some residual crackles L base but no evidence of ongoing infection  She is high risk of recurrent asp based on ct findings > gerd restrictions reviewed.  >>> f/u cxr in 6 weeks, call sooner if needed

## 2019-09-08 NOTE — Assessment & Plan Note (Addendum)
No desats walking 09/07/2019   rx = 2lpm hs only    I had an extended discussion with the patient reviewing all relevant studies completed to date and  lasting 25 minutes of a 40  minute post hosp f/u office visit to re-establish with me re new  severe non-specific but potentially very serious refractory respiratory symptoms of uncertain and potentially multiple  Etiologies.    I directly observed portions of ambulatory 02 saturation study which added to face to face time for this visit.   Each maintenance medication was reviewed in detail including most importantly the difference between maintenance and prns and under what circumstances the prns are to be triggered using an action plan format that is not reflected in the computer generated alphabetically organized AVS.    Please see AVS for specific instructions unique to this office visit that I personally wrote and verbalized to the the pt in detail and then reviewed with pt  by my nurse highlighting any changes in therapy/plan of care  recommended at today's visit.

## 2019-09-08 NOTE — Progress Notes (Signed)
Spoke with pt and notified of results per Dr. Wert. Pt verbalized understanding and denied any questions. 

## 2019-09-08 NOTE — Assessment & Plan Note (Addendum)
See doe a/p  Albumin  and tsh are nl with clear evidence of L >R heart failure so most likely both effusions are hydrostatic/chf related and rx per cards/pcp.  >>> f/u with cxr in 6 weeks, no additional recs

## 2019-09-10 ENCOUNTER — Other Ambulatory Visit: Payer: Self-pay | Admitting: Adult Health

## 2019-09-10 DIAGNOSIS — Z760 Encounter for issue of repeat prescription: Secondary | ICD-10-CM

## 2019-09-13 DIAGNOSIS — F419 Anxiety disorder, unspecified: Secondary | ICD-10-CM | POA: Diagnosis not present

## 2019-09-13 DIAGNOSIS — G8929 Other chronic pain: Secondary | ICD-10-CM | POA: Diagnosis not present

## 2019-09-13 DIAGNOSIS — M797 Fibromyalgia: Secondary | ICD-10-CM | POA: Diagnosis not present

## 2019-09-13 DIAGNOSIS — I4891 Unspecified atrial fibrillation: Secondary | ICD-10-CM | POA: Diagnosis not present

## 2019-09-13 DIAGNOSIS — I083 Combined rheumatic disorders of mitral, aortic and tricuspid valves: Secondary | ICD-10-CM | POA: Diagnosis not present

## 2019-09-13 DIAGNOSIS — K222 Esophageal obstruction: Secondary | ICD-10-CM | POA: Diagnosis not present

## 2019-09-13 DIAGNOSIS — I6529 Occlusion and stenosis of unspecified carotid artery: Secondary | ICD-10-CM | POA: Diagnosis not present

## 2019-09-13 DIAGNOSIS — I429 Cardiomyopathy, unspecified: Secondary | ICD-10-CM | POA: Diagnosis not present

## 2019-09-13 DIAGNOSIS — F319 Bipolar disorder, unspecified: Secondary | ICD-10-CM | POA: Diagnosis not present

## 2019-09-13 DIAGNOSIS — J449 Chronic obstructive pulmonary disease, unspecified: Secondary | ICD-10-CM | POA: Diagnosis not present

## 2019-09-13 DIAGNOSIS — I5022 Chronic systolic (congestive) heart failure: Secondary | ICD-10-CM | POA: Diagnosis not present

## 2019-09-13 DIAGNOSIS — M150 Primary generalized (osteo)arthritis: Secondary | ICD-10-CM | POA: Diagnosis not present

## 2019-09-13 DIAGNOSIS — I2510 Atherosclerotic heart disease of native coronary artery without angina pectoris: Secondary | ICD-10-CM | POA: Diagnosis not present

## 2019-09-13 DIAGNOSIS — R1310 Dysphagia, unspecified: Secondary | ICD-10-CM | POA: Diagnosis not present

## 2019-09-13 DIAGNOSIS — J9611 Chronic respiratory failure with hypoxia: Secondary | ICD-10-CM | POA: Diagnosis not present

## 2019-09-13 DIAGNOSIS — I110 Hypertensive heart disease with heart failure: Secondary | ICD-10-CM | POA: Diagnosis not present

## 2019-09-16 ENCOUNTER — Encounter: Payer: Self-pay | Admitting: Adult Health

## 2019-09-22 ENCOUNTER — Ambulatory Visit: Payer: Medicare Other | Admitting: Cardiology

## 2019-09-22 ENCOUNTER — Other Ambulatory Visit: Payer: Self-pay

## 2019-09-22 ENCOUNTER — Encounter: Payer: Self-pay | Admitting: Cardiology

## 2019-09-22 VITALS — BP 122/60 | HR 63 | Ht 63.0 in | Wt 135.0 lb

## 2019-09-22 DIAGNOSIS — I480 Paroxysmal atrial fibrillation: Secondary | ICD-10-CM | POA: Diagnosis not present

## 2019-09-22 DIAGNOSIS — I5023 Acute on chronic systolic (congestive) heart failure: Secondary | ICD-10-CM

## 2019-09-22 DIAGNOSIS — Z79899 Other long term (current) drug therapy: Secondary | ICD-10-CM

## 2019-09-22 DIAGNOSIS — I10 Essential (primary) hypertension: Secondary | ICD-10-CM | POA: Diagnosis not present

## 2019-09-22 DIAGNOSIS — R9431 Abnormal electrocardiogram [ECG] [EKG]: Secondary | ICD-10-CM

## 2019-09-22 DIAGNOSIS — I2510 Atherosclerotic heart disease of native coronary artery without angina pectoris: Secondary | ICD-10-CM

## 2019-09-22 MED ORDER — ENTRESTO 49-51 MG PO TABS: 1.0000 | ORAL_TABLET | Freq: Two times a day (BID) | ORAL | 2 refills | Status: DC

## 2019-09-22 NOTE — Progress Notes (Signed)
Cardiology Office Note:    Date:  09/22/2019   ID:  Julie Rice, DOB November 16, 1935, MRN [id number]  PCP:  Shirline Frees, NP  Cardiologist:  Charlton Haws, MD  Referring MD: Shirline Frees, NP   Chief Complaint  Patient presents with  . Follow-up  . Congestive Heart Failure    History of Present Illness:    Julie Rice is a 83 y.o. female with a past medical history significant for hypertension, hyperlipidemia, systolic CHF, fibromyalgia, anemia, esophageal stricture status post balloon dilatation, large symptomatic hiatal hernia status post fundoplication, chronic dysphagia, asthma/COPD on oxygen at 2 L at home, bipolar disorder, rheumatoid arthritis, TIA and chronic urinary incontinence.  Right and left heart cath on 07/26/2019 showed only mild nonobstructive disease and low right heart pressures.  The patient was admitted to the hospital 08/16/2019 with complaints of off-and-on chest pain and noted to have irregular pulse by home health nurse.  She was found to be in atrial fibrillation with RVR.  Echocardiogram showed EF 40-45%.  She was initially placed on diltiazem but changed to beta-blocker.  She had severe symptoms she was placed on amiodarone for rhythm control.  She was planned for electrical cardioversion but converted to sinus rhythm prior. CHADSVASC score is 7. She was started on Eliquis for stroke risk reduction.  Her QTC was elevated at 535 in the hospital and amiodarone was reduced and she was also taken off of Zofran.  I saw the patient in the office on 09/08/2019 for hospital follow-up at which time she was slowly getting stronger.  She was having some home [employer] with guided exercises.  BNP was noted to be slightly elevated at pulmonology office.  Her weight had come down and and she only had mild lower extremity puffiness.  She did have some shortness of breath the mornings and crackles in her left lung base. Her elevated BNP may have been in part related to  [facility].  I did have her take Lasix 40 mg x 4 days.  I called her during that time and she did report that she was feeling better.  She was maintaining sinus rhythm with a QTC of 499.  I decreased her amiodarone to 200 mg daily for maintenance dose.  The patient is here today for close follow-up and possible up titration of Entresto. She is still working with PT and walking outside some. She is still using oxygen at night. He still gets winded walking in from the parking lot.  She was 3 pounds up yesterday with increased edema so she took a dose of lasix. Down 2 pounds today and edema improved.   Home BPs stable 120's/60's.   She had some "low grade" mild chest pressure last night that lasted a few minutes after going to her mailbox and back. Otherwise no other chest pain.   She feels like she is slowly improving.   Cardiac studies   TEE 08/19/2019 IMPRESSIONS  1. Left ventricular ejection fraction, by visual estimation, is 25 to 30%. The left ventricle has severely decreased function. There is no left ventricular hypertrophy. 2. Global right ventricle has normal systolic function.The right ventricular size is normal. 3. Left atrial size was severely dilated. 4. Right atrial size was moderately dilated. 5. Mild mitral annular calcification. 6. The mitral valve is normal in structure. Moderate mitral valve regurgitation. 7. The tricuspid valve is normal in structure. Tricuspid valve regurgitation moderate. 8. The aortic valve is tricuspid Aortic valve regurgitation is mild by color flow  Doppler. Mild aortic valve sclerosis without stenosis. 9. The pulmonic valve was grossly normal. Pulmonic valve regurgitation is mild by color flow Doppler. 10. Mild plaque invoving the descending aorta. 11. Severe global reduction in LV systolic function; biatrial enlargement; atrial septal aneurysm; no LAA thrombus; mild AI; moderate MR and TR.   Echocardiogram 08/17/2019 IMPRESSIONS 1.  Left ventricular ejection fraction, by visual estimation, is 45 to 50%. Assessment of LV function complicated by atrial fibrillation, but overall appears mildly decreased global systolic function. Normal left ventricular size. There is mildly  increased left ventricular hypertrophy. 2. Left ventricular diastolic Doppler parameters are indeterminate pattern of LV diastolic filling. 3. Global right ventricle has normal systolic function.The right ventricular size is normal. No increase in right ventricular wall thickness. 4. Left atrial size was severely dilated. 5. Right atrial size was moderately dilated. 6. Moderate pleural effusion in the left lateral region. 7. Trivial pericardial effusion is present. 8. The mitral valve is abnormal. Mild to moderate mitral valve regurgitation. EROA 0.15, RV 23cc 9. The tricuspid valve is normal in structure. Tricuspid valve regurgitation is mild. 10. The aortic valve is tricuspid Aortic valve regurgitation is mild by color flow Doppler. 11. Mildly elevated pulmonary artery systolic pressure. RVSP 42 mmHg 12. The inferior vena cava is dilated in size with >50% respiratory variability, suggesting right atrial pressure of 8 mmHg.  RIGHT/LEFT HEART CATH AND CORONARY ANGIOGRAPHY  07/26/2019  Conclusion  Right dominant coronary anatomy  Widely patent left main  30 to 40% mid LAD  Widely patent circumflex  Luminal irregularities in a dominant right coronary.  Right heart pressures are relatively low. Pulmonary wedge pressure mean is 2 mmHg.  LVEDP 4 mmHg. Left ventriculography by hand-injection was not helpful. From images obtained, LV function appears relatively normal.  RECOMMENDATIONS:  Per treating team.  Relatively volume contracted and therefore we will give normal saline 100 cc/h for the next 4 hours.     Past Medical History:  Diagnosis Date  . Anemia   . Anxiety   . Bipolar disorder (HCC)   . Blood transfusion 1991    autologous pts own blood given   . CAD (coronary artery disease)   . Cataract   . Chest pain    "@ rest, lying down, w/exertion"  . Chronic back pain    "mostly lower back but I do have upper back pain regularly" (04/06/2018)  . COPD (chronic obstructive pulmonary disease) (HCC)   . Depression   . Esophageal stricture   . Fibromyalgia   . GERD (gastroesophageal reflux disease)   . Heart murmur    "slight" (04/06/2018)  . Hiatal hernia   . Hyperlipidemia    Patient denies  . Hypertension   . Internal hemorrhoids   . Lumbago   . Mitral regurgitation   . Osteoarthritis   . Osteoporosis   . Pneumonia ~ 01/2018  . PONV (postoperative nausea and vomiting)    severe ponv, "in the past" (04/06/2018)  . Rectal bleeding   . Rheumatoid arthritis (HCC)   . Spondylosis   . TIA (transient ischemic attack) 2013  . Urinary incontinence    wears depends    Past Surgical History:  Procedure Laterality Date  . ABDOMINAL HYSTERECTOMY  1982  . BACK SURGERY    . BALLOON DILATION N/A 09/21/2018   Procedure: BALLOON DILATION;  Surgeon: Hilarie FredricksonPerry, John N, MD;  [facility];  Service: Endoscopy;  Laterality: N/A;  . BALLOON DILATION N/A 08/12/2019   Procedure: BALLOON DILATION;  Surgeon: Lynann Bologna, MD;  Location[facility]COPY;  Service: Endoscopy;  Laterality: N/A;  . BIOPSY  08/12/2019   Procedure: BIOPSY;  Surgeon: Lynann Bologna, MD;  Location: [facility] ENDOSCOPY;  Service: Endoscopy;;  . BLADDER SUSPENSION  1980's  . BOTOX INJECTION N/A 09/21/2018   Procedure: BOTOX INJECTION;  Surgeon: Hilarie Fredrickson, MD;  Location: [facility] ENDOSCOPY;  Service: Endoscopy;  Laterality: N/A;  . BOTOX INJECTION N/A 08/12/2019   Procedure: BOTOX INJECTION;  Surgeon: Lynann Bologna, MD;  Location: [facility] ENDOSCOPY;  Service: Endoscopy;  Laterality: N/A;  . CATARACT EXTRACTION W/ INTRAOCULAR LENS  IMPLANT, BILATERAL Bilateral 2010  . DILATION AND CURETTAGE OF UTERUS  1961  . ESOPHAGEAL MANOMETRY N/A 03/25/2018   Procedure:  ESOPHAGEAL MANOMETRY (EM);  Surgeon: Napoleon Form, MD;  Location: [facility] ENDOSCOPY;  Service: Endoscopy;  Laterality: N/A;  . ESOPHAGOGASTRODUODENOSCOPY (EGD) WITH PROPOFOL N/A 04/07/2018   Procedure: ESOPHAGOGASTRODUODENOSCOPY (EGD) WITH PROPOFOL;  Surgeon: Sherrilyn Rist, MD;  Location: [facility] ENDOSCOPY;  Service: Gastroenterology;  Laterality: N/A;  . ESOPHAGOGASTRODUODENOSCOPY (EGD) WITH PROPOFOL N/A 09/21/2018   Procedure: ESOPHAGOGASTRODUODENOSCOPY (EGD) WITH PROPOFOL;  Surgeon: Hilarie Fredrickson, MD;  Location: [facility] ENDOSCOPY;  Service: Endoscopy;  Laterality: N/A;  . ESOPHAGOGASTRODUODENOSCOPY (EGD) WITH PROPOFOL N/A 08/12/2019   Procedure: ESOPHAGOGASTRODUODENOSCOPY (EGD) WITH PROPOFOL;  Surgeon: Lynann Bologna, MD;  Location: [facility] ENDOSCOPY;  Service: Endoscopy;  Laterality: N/A;  . HERNIA REPAIR    . HIATAL HERNIA REPAIR N/A 08/02/2016   Procedure: LAPAROSCOPIC REPAIR OF LARGE  HIATAL HERNIA;  Surgeon: Glenna Fellows, MD;  Location: [facility] ORS;  Service: General;  Laterality: N/A;  . JOINT REPLACEMENT    . LAPAROSCOPIC NISSEN FUNDOPLICATION N/A 08/02/2016   Procedure: LAPAROSCOPIC NISSEN FUNDOPLICATION;  Surgeon: Glenna Fellows, MD;  Location: [facility] ORS;  Service: General;  Laterality: N/A;  . LUMBAR LAMINECTOMY  1990; 1994; 5009;[id number]   "I've got 2 stainless steel rods; 6 screws; 2 ray cages"took bone from right hip to put in back  . RIGHT/LEFT HEART CATH AND CORONARY ANGIOGRAPHY N/A 07/26/2019   Procedure: RIGHT/LEFT HEART CATH AND CORONARY ANGIOGRAPHY;  Surgeon: Lyn Records, MD;  Location: [facility] INVASIVE CV LAB;  Service: Cardiovascular;  Laterality: N/A;  . TEE WITHOUT CARDIOVERSION N/A 08/19/2019   Procedure: TRANSESOPHAGEAL ECHOCARDIOGRAM (TEE);  Surgeon: Lewayne Bunting, MD;  Location: [facility] ENDOSCOPY;  Service: Cardiovascular;  Laterality: N/A;  . TONSILLECTOMY AND ADENOIDECTOMY  1945  . TOTAL KNEE ARTHROPLASTY Right ~ 1996  . TUBAL LIGATION  ~ 1976    Current Medications: Current Meds   Medication Sig  . acetaminophen (TYLENOL) 325 MG tablet Take 2 tablets (650 mg total) by mouth every 4 (four) hours as needed for headache or mild pain.  Marland Kitchen albuterol (VENTOLIN HFA) 108 (90 Base) MCG/ACT inhaler Inhale 2 puffs into the lungs every 6 (six) hours as needed for wheezing or shortness of breath.  Marland Kitchen alum & mag hydroxide-simeth (MAALOX/MYLANTA) 200-200-20 MG/5ML suspension Take 30 mLs by mouth every 4 (four) hours as needed for indigestion.  Marland Kitchen amiodarone (PACERONE) 200 MG tablet Take 1 tablet (200 mg total) by mouth daily.  Marland Kitchen amLODipine (NORVASC) 2.5 MG tablet Take 2.5 mg by mouth daily.  Marland Kitchen apixaban (ELIQUIS) 5 MG TABS tablet Take 1 tablet (5 mg total) by mouth 2 (two) times daily.  . carvedilol (COREG) 6.25 MG tablet Take 1 tablet (6.25 mg total) by mouth 2 (two) times daily with a meal.  . clonazePAM (KLONOPIN) 0.5 MG tablet Take 1 tablet (0.5 mg total) by mouth  2 (two) times daily as needed for anxiety.  . furosemide (LASIX) 40 MG tablet Take 1 tablet (40 mg total) by mouth daily. Take 1 tablet by mouth once a day for 4 days then use as need for shortness of breath, weight gain or swelling  . gabapentin (NEURONTIN) 100 MG capsule TAKE 1 CAPSULE BY MOUTH THREE TIMES A DAY  . loperamide (IMODIUM) 2 MG capsule Take 1 capsule (2 mg total) by mouth as needed for diarrhea or loose stools.  . Multiple Vitamin (MULTIVITAMIN) tablet Take 1 tablet by mouth daily. Vitamin supplement.  Marland Kitchen. omeprazole (PRILOSEC) 20 MG capsule Take 1 capsule (20 mg total) by mouth daily.  Marland Kitchen. spironolactone (ALDACTONE) 25 MG tablet Take 0.5 tablets (12.5 mg total) by mouth daily.  . sucralfate (CARAFATE) 1 g tablet Take 1 tablet (1 g total) by mouth 4 (four) times daily. Dissolve tablet in 10 ml warm water  . tetrahydrozoline 0.05 % ophthalmic solution Place 1 drop into both eyes daily.  . [DISCONTINUED] sacubitril-valsartan (ENTRESTO) 24-26 MG Take 1 tablet by mouth 2 (two) times daily.     Allergies:   Morphine and  Tape   Social History   Socioeconomic History  . Marital status: Widowed    Spouse name: Not on file  . Number of children: 2  . Years of education: Not on file  . Highest education level: Not on file  Occupational History    Employer: RETIRED  Social Needs  . Financial resource strain: Not on file  . Food insecurity    Worry: Not on file    Inability: Not on file  . Transportation needs    Medical: Not on file    Non-medical: Not on file  Tobacco Use  . Smoking status: Never Smoker  . Smokeless tobacco: Never Used  Substance and Sexual Activity  . Alcohol use: Never    Frequency: Never  . Drug use: Never  . Sexual activity: Not Currently  Lifestyle  . Physical activity    Days per week: Not on file    Minutes per session: Not on file  . Stress: Not on file  Relationships  . Social Musicianconnections    Talks on phone: Not on file    Gets together: Not on file    Attends religious service: Not on file    Active member of club or organization: Not on file    Attends meetings of clubs or organizations: Not on file    Relationship status: Not on file  Other Topics Concern  . Not on file  Social History Narrative   Retired    Widowed     Family History: The patient's family history includes Heart attack in her brother; Heart disease in her brother; Heart disease (age of onset: 1867) in her father; Hypertension in her mother; Kidney disease in her mother; Suicidality in her son. There is no history of Colon cancer, Esophageal cancer, Pancreatic cancer, Rectal cancer, or Stomach cancer. ROS:   Please see the history of present illness.     All other systems reviewed and are negative.   EKG:  EKG is ordered today.  The ekg ordered today demonstrates sinus rhythm, LAD, LBBB, 63 bpm, machine read QTC is 519 however by my measurement QTC is more like 440.  Recent Labs: 08/17/2019: B Natriuretic Peptide 599.2 08/23/2019: Magnesium 1.8 09/07/2019: ALT 10; BUN 14; Creatinine, Ser  0.77; Hemoglobin 11.0; Platelets 218.0; Potassium 4.0; Pro B Natriuretic peptide (BNP) 1,473.0; Sodium 137; TSH  3.58   Recent Lipid Panel    Component Value Date/Time   CHOL 127 08/19/2019 0429   TRIG 103 08/19/2019 0429   HDL 42 08/19/2019 0429   CHOLHDL 3.0 08/19/2019 0429   VLDL 21 08/19/2019 0429   LDLCALC 64 08/19/2019 0429   LDLDIRECT 122.2 06/01/2013 0944    Physical Exam:    VS:  BP 122/60   Pulse 63   Ht 5\' 3"  (1.6 m)   Wt 135 lb (61.2 kg)   SpO2 99%   BMI 23.91 kg/m     Wt Readings from Last 6 Encounters:  09/22/19 135 lb (61.2 kg)  09/08/19 137 lb (62.1 kg)  09/07/19 136 lb (61.7 kg)  08/22/19 142 lb 11.2 oz (64.7 kg)  08/13/19 65 lb 11.2 oz (29.8 kg)  07/27/19 140 lb 6.9 oz (63.7 kg)     Physical Exam  Constitutional: She is oriented to person, place, and time. She appears well-developed and well-nourished. No distress.  HENT:  Head: Normocephalic and atraumatic.  Neck: Normal range of motion. Neck supple. No JVD present.  Cardiovascular: Normal rate, regular rhythm, normal heart sounds and intact distal pulses. Exam reveals no gallop and no friction rub.  No murmur heard. Pulmonary/Chest: Effort normal and breath sounds normal. No respiratory distress. She has no wheezes. She has no rales.  Abdominal: Soft. Bowel sounds are normal.  Musculoskeletal: Normal range of motion.        General: Edema present.     Comments: Trace puffiness of ankles  Neurological: She is alert and oriented to person, place, and time.  Skin: Skin is warm and dry.  Psychiatric: She has a normal mood and affect. Her behavior is normal. Judgment and thought content normal.  Vitals reviewed.   ASSESSMENT:    1. Medication management   2. Acute on chronic systolic congestive heart failure (HCC)   3. Paroxysmal atrial fibrillation ([place])   4. Prolonged Q-T interval on ECG   5. Coronary artery disease involving native coronary artery of native heart without angina pectoris   6.  Essential hypertension    PLAN:    In order of problems listed above:  Acute on chronic systolic heart failure: -LVEF 25 to 30% by recent TEE.  TTE just 3 days priorshowed EF 45-50%.   -Now on coreg, Entresto, spiro.  -BNP on 11/3 was elevated at 1473 (which can be elevated with Entresto so it is unclear if this is actually an elevation or her new baseline)  -She was given Lasix 40 mg daily x4 days with improvement. She is now using lasix as needed. Lungs are clear today, minimal puffiness of ankles, no orthopnea.  -I will up titrate Entresto today to 49/51 mg twice daily.  I will check a basic metabolic panel today and in 2 weeks after medication adjustment.  New onset atrial fibrillation: -Recent hospitalization with finding of new onset atrial fibrillation with RVR.  The patient was very symptomatic so rhythm control strategy was adopted and she has been placed on amiodarone, which has been reduced to 200 mg daily maintenance dose. -The patient continues in sinus rhythm and has not had no perceived recurrences of A. fib.   -CHADSVASC score is 7.  Patient continues on Eliquis. No unusual bleeding.   Prolonged QTc: -Pt advised to limit use of tramadol and zofran, currently not using.  -EKG report indicates QTC of 519 however by my calculation it is more like 440.   Nonobstructive CAD: -by cardiac catheterization on  September 21.  Chronic Nausea/dysphagia: -Complicated history. She had esophageal dilatation and botox during recent hospitalization.  She reports that her swallowing is better.  -Followed by GI, on PPI.   NSVT: -Patient had some episodes of NSVT in the hospital on telemetry.  On beta-blocker. No perceived recurrences.    Medication Adjustments/Labs and Tests Ordered: Current medicines are reviewed at length with the patient today.  Concerns regarding medicines are outlined above. Labs and tests ordered and medication changes are outlined in the patient  instructions below:  Patient Instructions  Medication Instructions:  INCREASE: Entresto to 49-51 mg twice a day   *If you need a refill on your cardiac medications before your next appointment, please call your pharmacy*  Lab Work: TODAY: BMET  FUTURE: BMET on 10/06/2019 (lab is open form 7:30 AM to 4:30 PM)  If you have labs (blood work) drawn today and your tests are completely normal, you will receive your results only by: Marland Kitchen MyChart Message (if you have MyChart) OR . A paper copy in the mail If you have any lab test that is abnormal or we need to change your treatment, we will call you to review the results.  Testing/Procedures: None   Follow-Up: You are scheduled to see Lizabeth Leyden, NP on 11/11/2019 @ 2:30 PM  Other Instructions      Signed, Berton Bon, NP  09/22/2019 5:43 PM    [facility]

## 2019-09-22 NOTE — Patient Instructions (Signed)
Medication Instructions:  INCREASE: Entresto to 49-51 mg twice a day   *If you need a refill on your cardiac medications before your next appointment, please call your pharmacy*  Lab Work: TODAY: BMET  FUTURE: BMET on 10/06/2019 (lab is open form 7:30 AM to 4:30 PM)  If you have labs (blood work) drawn today and your tests are completely normal, you will receive your results only by: Marland Kitchen MyChart Message (if you have MyChart) OR . A paper copy in the mail If you have any lab test that is abnormal or we need to change your treatment, we will call you to review the results.  Testing/Procedures: None   Follow-Up: You are scheduled to see Pecolia Ades, NP on 11/11/2019 @ 2:30 PM  Other Instructions

## 2019-09-23 ENCOUNTER — Other Ambulatory Visit: Payer: Self-pay | Admitting: Adult Health

## 2019-09-23 LAB — BASIC METABOLIC PANEL
BUN/Creatinine Ratio: 16 (ref 12–28)
BUN: 15 mg/dL (ref 8–27)
CO2: 25 mmol/L (ref 20–29)
Calcium: 9.5 mg/dL (ref 8.7–10.3)
Chloride: 101 mmol/L (ref 96–106)
Creatinine, Ser: 0.94 mg/dL (ref 0.57–1.00)
GFR calc Af Amer: 66 mL/min/{1.73_m2} (ref >59)
GFR calc non Af Amer: 57 mL/min/{1.73_m2} — ABNORMAL LOW (ref >59)
Glucose: 94 mg/dL (ref 65–99)
Potassium: 4 mmol/L (ref 3.5–5.2)
Sodium: 141 mmol/L (ref 134–144)

## 2019-09-23 MED ORDER — APIXABAN 5 MG PO TABS: 5.0000 mg | ORAL_TABLET | Freq: Two times a day (BID) | ORAL | 3 refills | Status: DC

## 2019-09-23 MED ORDER — CARVEDILOL 6.25 MG PO TABS: 6.2500 mg | ORAL_TABLET | Freq: Two times a day (BID) | ORAL | 3 refills | Status: DC

## 2019-09-23 MED ORDER — SPIRONOLACTONE 25 MG PO TABS: 12.5000 mg | ORAL_TABLET | Freq: Every day | ORAL | 3 refills | Status: DC

## 2019-09-24 ENCOUNTER — Encounter: Payer: Self-pay | Admitting: Internal Medicine

## 2019-09-24 ENCOUNTER — Ambulatory Visit: Payer: Medicare Other | Admitting: Internal Medicine

## 2019-09-24 VITALS — BP 104/58 | HR 62 | Temp 97.8°F | Ht 61.5 in | Wt 135.0 lb

## 2019-09-24 DIAGNOSIS — K224 Dyskinesia of esophagus: Secondary | ICD-10-CM | POA: Diagnosis not present

## 2019-09-24 DIAGNOSIS — R1310 Dysphagia, unspecified: Secondary | ICD-10-CM | POA: Diagnosis not present

## 2019-09-24 DIAGNOSIS — K220 Achalasia of cardia: Secondary | ICD-10-CM

## 2019-09-24 NOTE — Progress Notes (Signed)
HISTORY OF PRESENT ILLNESS:  Julie Rice is a 83 y.o. female with a history of GERD, esophageal stricture, large symptomatic hiatal hernia requiring fundoplication, post fundoplication dysphagia secondary to dysmotility (primary versus secondary with technically limited esophageal manometry).  She has undergone endoscopy with Botox injection therapy and esophageal dilation on several occasions.  Transiently, this has been quite helpful.  Hospitalized in October with aspiration.  Worsening swallowing troubles at that time for which [name] performed upper endoscopy with Botox injection and dilation of the distal esophagus as well as the hiatal hernia with slipped fundoplication.  She presents today for follow-up.  She is accompanied by her sister-in-law.  The patient is pleased to report that she is doing well.  Marked improvement in swallowing.  No vomiting.  Good appetite.  REVIEW OF SYSTEMS:  All non-GI ROS negative unless otherwise stated in the HPI except for ankle edema  Past Medical History:  Diagnosis Date  . Anemia   . Anxiety   . Bipolar disorder ([place])   . Blood transfusion 1991   autologous pts own blood given   . CAD (coronary artery disease)   . Cataract   . Chest pain    "@ rest, lying down, w/exertion"  . Chronic back pain    "mostly lower back but I do have upper back pain regularly" (04/06/2018)  . COPD (chronic obstructive pulmonary disease) ([place])   . Depression   . Esophageal stricture   . Fibromyalgia   . GERD (gastroesophageal reflux disease)   . Heart murmur    "slight" (04/06/2018)  . Hiatal hernia   . Hyperlipidemia    Patient denies  . Hypertension   . Internal hemorrhoids   . Lumbago   . Mitral regurgitation   . Osteoarthritis   . Osteoporosis   . Pneumonia ~ 01/2018  . PONV (postoperative nausea and vomiting)    severe ponv, "in the past" (04/06/2018)  . Rectal bleeding   . Rheumatoid arthritis ([place])   . Spondylosis   . TIA (transient ischemic  attack) 2013  . Urinary incontinence    wears depends    Past Surgical History:  Procedure Laterality Date  . ABDOMINAL HYSTERECTOMY  1982  . BACK SURGERY    . BALLOON DILATION N/A 09/21/2018   Procedure: BALLOON DILATION;  Surgeon: Irene Shipper, MD;  Location: [facility] ENDOSCOPY;  Service: Endoscopy;  Laterality: N/A;  . BALLOON DILATION N/A 08/12/2019   Procedure: BALLOON DILATION;  Surgeon: Jackquline Denmark, MD;  Location: [facility] ENDOSCOPY;  Service: Endoscopy;  Laterality: N/A;  . BIOPSY  08/12/2019   Procedure: BIOPSY;  Surgeon: Jackquline Denmark, MD;  Location: [facility] ENDOSCOPY;  Service: Endoscopy;;  . BLADDER SUSPENSION  1980's  . BOTOX INJECTION N/A 09/21/2018   Procedure: BOTOX INJECTION;  Surgeon: Irene Shipper, MD;  Location: [facility] ENDOSCOPY;  Service: Endoscopy;  Laterality: N/A;  . BOTOX INJECTION N/A 08/12/2019   Procedure: BOTOX INJECTION;  Surgeon: Jackquline Denmark, MD;  Location: [facility] ENDOSCOPY;  Service: Endoscopy;  Laterality: N/A;  . CATARACT EXTRACTION W/ INTRAOCULAR LENS  IMPLANT, BILATERAL Bilateral 2010  . DILATION AND CURETTAGE OF UTERUS  1961  . ESOPHAGEAL MANOMETRY N/A 03/25/2018   Procedure: ESOPHAGEAL MANOMETRY (EM);  Surgeon: Mauri Pole, MD;  Location: [facility] ENDOSCOPY;  Service: Endoscopy;  Laterality: N/A;  . ESOPHAGOGASTRODUODENOSCOPY (EGD) WITH PROPOFOL N/A 04/07/2018   Procedure: ESOPHAGOGASTRODUODENOSCOPY (EGD) WITH PROPOFOL;  Surgeon: Doran Stabler, MD;  Location: [place];  Service: Gastroenterology;  Laterality: N/A;  .  ESOPHAGOGASTRODUODENOSCOPY (EGD) WITH PROPOFOL N/A 09/21/2018   Procedure: ESOPHAGOGASTRODUODENOSCOPY (EGD) WITH PROPOFOL;  Surgeon: Hilarie Fredrickson, MD;  Location: [facility] ENDOSCOPY;  Service: Endoscopy;  Laterality: N/A;  . ESOPHAGOGASTRODUODENOSCOPY (EGD) WITH PROPOFOL N/A 08/12/2019   Procedure: ESOPHAGOGASTRODUODENOSCOPY (EGD) WITH PROPOFOL;  Surgeon: Lynann Bologna, MD;  Location: [facility] ENDOSCOPY;  Service: Endoscopy;  Laterality: N/A;  . HERNIA REPAIR     . HIATAL HERNIA REPAIR N/A 08/02/2016   Procedure: LAPAROSCOPIC REPAIR OF LARGE  HIATAL HERNIA;  Surgeon: Glenna Fellows, MD;  Location: [facility] ORS;  Service: General;  Laterality: N/A;  . JOINT REPLACEMENT    . LAPAROSCOPIC NISSEN FUNDOPLICATION N/A 08/02/2016   Procedure: LAPAROSCOPIC NISSEN FUNDOPLICATION;  Surgeon: Glenna Fellows, MD;  Location: [facility] ORS;  Service: General;  Laterality: N/A;  . LUMBAR LAMINECTOMY  1990; 1994; [id number]   "I've got 2 stainless steel rods; 6 screws; 2 ray cages"took bone from right hip to put in back  . RIGHT/LEFT HEART CATH AND CORONARY ANGIOGRAPHY N/A 07/26/2019   Procedure: RIGHT/LEFT HEART CATH AND CORONARY ANGIOGRAPHY;  Surgeon: Lyn Records, MD;  Location: [facility] INVASIVE CV LAB;  Service: Cardiovascular;  Laterality: N/A;  . TEE WITHOUT CARDIOVERSION N/A 08/19/2019   Procedure: TRANSESOPHAGEAL ECHOCARDIOGRAM (TEE);  Surgeon: Lewayne Bunting, MD;  Location: [facility] ENDOSCOPY;  Service: Cardiovascular;  Laterality: N/A;  . TONSILLECTOMY AND ADENOIDECTOMY  1945  . TOTAL KNEE ARTHROPLASTY Right ~ 1996  . TUBAL LIGATION  ~ 1976    Social History Julie Rice  reports that she has never smoked. She has never used smokeless tobacco. She reports that she does not drink alcohol or use drugs.  family history includes Heart attack in her brother; Heart disease in her brother; Heart disease (age of onset: 23) in her father; Hypertension in her mother; Kidney disease in her mother; Suicidality in her son.  Allergies  Allergen Reactions  . Morphine Other (See Comments)    Flushing, rash, itching  . Tape Other (See Comments)    Band aides, adhesive tape Redness and pulls skin off       PHYSICAL EXAMINATION: Vital signs: BP (!) 104/58   Pulse 62   Temp 97.8 F (36.6 C)   Ht 5' 1.5" (1.562 m)   Wt 135 lb (61.2 kg)   BMI 25.09 kg/m   Constitutional: generally well-appearing, no acute distress Psychiatric: alert and oriented x3, cooperative Eyes:  extraocular movements intact, anicteric, conjunctiva pink Mouth: oral pharynx moist, no lesions Neck: supple no lymphadenopathy Cardiovascular: heart regular rate and rhythm, no murmur Lungs: clear to auscultation bilaterally Abdomen: soft, nontender, nondistended, no obvious ascites, no peritoneal signs, normal bowel sounds, no organomegaly Rectal: Omitted Extremities: no clubbing or cyanosis.  1+ lower extremity edema bilaterally Skin: no lesions on visible extremities Neuro: No focal deficits.  Cranial nerves intact  ASSESSMENT:  1.  GERD with history of peptic stricture large hiatal hernia and prior fundoplication.  Problems with dilation improved post Botox injection and dilation 6 weeks ago 2.  Esophageal dysmotility, primary versus secondary 3.  Multiple general medical problems   PLAN:  1.  Continue PPI 2.  Contact the office for recurrent problems 3.  Resume general medical care with PCP and other specialists 15 minute spent face-to-face with the patient.  Greater than 50% of the time used for counseling regarding her complicated reflux disease and dysphagia

## 2019-09-24 NOTE — Patient Instructions (Signed)
Please follow up as needed 

## 2019-09-26 DIAGNOSIS — J9611 Chronic respiratory failure with hypoxia: Secondary | ICD-10-CM | POA: Diagnosis not present

## 2019-09-26 DIAGNOSIS — J449 Chronic obstructive pulmonary disease, unspecified: Secondary | ICD-10-CM | POA: Diagnosis not present

## 2019-09-27 NOTE — Telephone Encounter (Signed)
Filled for 1 year by cardiology.  Nothing further needed.

## 2019-10-06 ENCOUNTER — Other Ambulatory Visit: Payer: Medicare Other | Admitting: *Deleted

## 2019-10-06 ENCOUNTER — Other Ambulatory Visit: Payer: Self-pay

## 2019-10-06 DIAGNOSIS — Z79899 Other long term (current) drug therapy: Secondary | ICD-10-CM | POA: Diagnosis not present

## 2019-10-06 DIAGNOSIS — I10 Essential (primary) hypertension: Secondary | ICD-10-CM | POA: Diagnosis not present

## 2019-10-06 LAB — BASIC METABOLIC PANEL
BUN/Creatinine Ratio: 19 (ref 12–28)
BUN: 17 mg/dL (ref 8–27)
CO2: 24 mmol/L (ref 20–29)
Calcium: 8.9 mg/dL (ref 8.7–10.3)
Chloride: 104 mmol/L (ref 96–106)
Creatinine, Ser: 0.91 mg/dL (ref 0.57–1.00)
GFR calc Af Amer: 68 mL/min/{1.73_m2} (ref >59)
GFR calc non Af Amer: 59 mL/min/{1.73_m2} — ABNORMAL LOW (ref >59)
Glucose: 80 mg/dL (ref 65–99)
Potassium: 4.4 mmol/L (ref 3.5–5.2)
Sodium: 143 mmol/L (ref 134–144)

## 2019-10-10 ENCOUNTER — Other Ambulatory Visit: Payer: Self-pay | Admitting: Adult Health

## 2019-10-10 DIAGNOSIS — Z760 Encounter for issue of repeat prescription: Secondary | ICD-10-CM

## 2019-10-11 NOTE — Telephone Encounter (Signed)
Josue Hector, MD  Thompson Grayer, RN; Aris Georgia, Olin Hauser, RN        Not a good idea to be in big crowd at her age with recent hospitalization. She should be on her lasix 40 mg daily have her see Sherlyn Hay PA again in a week or two      Called patient. Informed her of Dr. Kyla Balzarine recommendations. Made patient an appointment in 2 weeks with Pecolia Ades NP. Patient verbalized understanding.

## 2019-10-13 ENCOUNTER — Ambulatory Visit
Admission: RE | Admit: 2019-10-13 | Discharge: 2019-10-13 | Disposition: A | Discharge status: Home / Self Care | Payer: Medicare Other | Source: Ambulatory Visit | Attending: Adult Health | Admitting: Adult Health

## 2019-10-13 ENCOUNTER — Other Ambulatory Visit: Payer: Self-pay

## 2019-10-13 DIAGNOSIS — E2839 Other primary ovarian failure: Secondary | ICD-10-CM

## 2019-10-13 DIAGNOSIS — M8589 Other specified disorders of bone density and structure, multiple sites: Secondary | ICD-10-CM | POA: Diagnosis not present

## 2019-10-13 DIAGNOSIS — Z780 Asymptomatic menopausal state: Secondary | ICD-10-CM | POA: Diagnosis not present

## 2019-10-19 ENCOUNTER — Encounter: Payer: Self-pay | Admitting: Internal Medicine

## 2019-10-19 ENCOUNTER — Ambulatory Visit: Payer: Medicare Other | Admitting: Internal Medicine

## 2019-10-19 ENCOUNTER — Other Ambulatory Visit: Payer: Self-pay

## 2019-10-19 ENCOUNTER — Ambulatory Visit (INDEPENDENT_AMBULATORY_CARE_PROVIDER_SITE_OTHER): Payer: Medicare Other

## 2019-10-19 DIAGNOSIS — R058 Other specified cough: Secondary | ICD-10-CM

## 2019-10-19 DIAGNOSIS — R0600 Dyspnea, unspecified: Secondary | ICD-10-CM

## 2019-10-19 DIAGNOSIS — J90 Pleural effusion, not elsewhere classified: Secondary | ICD-10-CM | POA: Diagnosis not present

## 2019-10-19 DIAGNOSIS — J9611 Chronic respiratory failure with hypoxia: Secondary | ICD-10-CM | POA: Diagnosis not present

## 2019-10-19 DIAGNOSIS — R0609 Other forms of dyspnea: Secondary | ICD-10-CM

## 2019-10-19 DIAGNOSIS — R05 Cough: Secondary | ICD-10-CM

## 2019-10-19 MED ORDER — FAMOTIDINE 20 MG PO TABS: ORAL_TABLET | ORAL | 11 refills | Status: DC

## 2019-10-19 NOTE — Patient Instructions (Addendum)
Please remember to go to the  x-ray department  for your tests - we will call you with the results when they are available    Add pepcid 20 mg at bedtime    Albuterol is a "rescue medication"  for relief of breathlessness  that does not improve by walking a slower pace or resting  for a few minutes (it doesn't really start working for 5-15 minutes anyway so ok to wait to see if resting helps)  However, if you are convinced, as many are, that albuterol  helps recover from activity faster then it's easy to tell if this is the case by re-challenging : stop, take the inhaler, then 15 minutes later try the exact same activity (intensity of workload) that just caused the symptoms and see if they are better by using the albuterol prior to exertion.  If  there is an activity that reproducibly causes the breathing problem every time you attempt it, try the albuterol  (either inhaler or nebulizer)  15 min before the activity on alternate days to see if there really is a difference and let me know what you find.   >>>>  Please schedule a follow up visit in 6 months but call sooner if needed

## 2019-10-19 NOTE — Progress Notes (Signed)
Subjective:     Patient ID: Julie Rice, female   DOB: Oct 04, 1936,   MRN: [id number]    Brief patient profile:  89  yowf never smoker dx RA followed by Denyse Dago on nsaids with new doe around 2000 eval by Dr Jayme Cloud: inhaler helped some but never back to baseline then rx by [facility] 08/02/16 LAPAROSCOPIC REPAIR OF LARGE  HIATAL HERNIA LAPAROSCOPIC NISSEN FUNDOPLICATION >>  and dysphagia ever since with eval [name] 02/19/17 EGD dilated benign esoph stenosis and improved but  new cough /gag/vomit around Nov 04 2017 then abrupt pain under R breast x around 12/20/17 then cxr 12/22/17 c/w pna with cavitary changes on CT 12/25/17 so referred to pulmonary clinic 12/31/2017 by Dr  Kirtland Bouchard p rx with augmentin starting 12/25/17 for possible lung abscess.     History of Present Illness  12/31/2017 1st [facility] office visit/ Julie Rice   Chief Complaint  Patient presents with  . Pulmonary Consult    Referred by Dr. Amador Cunas. Pt c/o cough since Jan 2019. She states she has had SOB off and on for the past several yrs. She was recently dxed with PNA. Her cough is non prod and worse at night.   since abx started R side CP  improved, cough still a problem > beige esp at hs / still gag/ vomit with overt HB not on ppi daily ac Lower molar R dental work one month prior to onset of cp Sleeps on L side typically not on back  rec Augmentin 875 mg take one pill twice daily  X 10 days   Protonix 20 mg x 2 (40 mg called in)   Take 30- 60 min before your first and last meals of the day  GERD  diet Take delsym two tsp every 12 hours and supplement if needed with  tramadol 50 mg up to 1 every 4 hours     01/12/2018  f/u ov/Julie Rice re: lung abscess  "I'm no better at all"  Chief Complaint  Patient presents with  . Follow-up    SOB with activity and at rest, non-productive cough, chest pain bilateral sides behind breast,   Dyspnea:  No better Cough: no sputum production  Sleep: tramadol works well / only taking twice  daily (rec was for up to 1 q4)  Cp now 2/10 only feels it with deep breath  rec Take delsym two tsp every 12 hours and supplement if needed with  tramadol 50 mg up to 1 every 4 hours to suppress the urge to cough. Swallowing water or using ice chips/non mint and menthol containing candies (such as lifesavers or sugarless jolly ranchers) are also effective.  You should rest your voice and avoid activities that you know make you cough. Once you have eliminated the cough for 3 straight days try reducing the tramadol first,  then the delsym as tolerated. Please remember to go to the  x-ray department downstairs in the basement  for your tests - we will call you with the results when they are available. Clindamycin 150 mg four times a day x 10 days and then return with cxr      Add:  Changed to 300 mg tid and advised by phone     02/05/2018  f/u ov/Julie Rice re: final f/u re lung abscess from ? Asp pna  Chief Complaint  Patient presents with  . Follow-up    ER VISIT 3.31.19, NON PRODUCTIVE COUGH  R cp leveled off  Now can barely feel  it with the deepest breath Sob the same  At rest and worse walking  Cough is improved but still waking up with it and also some daytime but dry  N and V ? From clindamycin  Coughed so hard on 01/30/18 started to hurt L chest > to ER 02/01/18  New med from ER = zofran > eating and drinking now  Last clindamycin about a week prior to OV  rec Please see Dr [name] as soon as possible as I suspect your swallowing problem caused your lung problem which is slowly healing Stop your clindamycin  You will need a follow up cxr in 3 months - fine to let Dr [name] do this  - return here if cough or pain with breathing gets worse with all active medications in hand including the over the counter ones     Admit date: 08/16/2019 Discharge date: 08/24/2019  Recommendations for Outpatient Follow-up:  1. Eat only sitting up at 90 degrees. 2. Follow up with PCP in 7-10 days. 3. Chemistry  to be drawn in 7 days and reported to PCP. 4. Patient to follow up with [name] within 2 weeks. 5. Patient to follow up with [name] within one month.    Discharge Diagnoses: Principal diagnosis is #1 1. Atrial fibrillation with RVR 2. Chest Pain 3. Intractable nausea and vomiting 4. Elevated troponin 5. Hypothyroidism 6. Dyslipidemia 7. Anemia 8. Hypertension 9. GERD 10. Prolonged QT interval 11. History of systolic CHF 12. Asthma  Discharge Condition: Fair  Disposition: Home  Diet recommendation: regular       Filed Weights   08/20/19 0410 08/21/19 0443 08/22/19 0559  Weight: 65.3 kg 64.6 kg 64.7 kg    History of present illness:   Ms.Cofferlooks to have been seen very remotely (1990's) by [name] and not since then. Recently referred back for some reports of CP and palpitations, though presented to the hospital last month prior To her appointment date. She was noted to have new reduced LVEF 40%, CHF, was in SR w/PVCs. Cardiology noted she had a 4 day monitor via her PMD noting intermittent runs of SVT with the longest being 13 beats. She was diuresed, underwent R/LHC Widely patent coronary arteries,Low right heart and LV filling pressures During her stay she had an SVT that responded to adenosine She was discharged 07/27/2019  She was re-admitted 08/07/2019 with SOB, hypoxic resp failure, found with aspiration pneumonia.Barium swallow study showed significant narrowing of the upper part of the esophagus but no esophageal stricture. Underwent EGD with finding of benign-appearing esophageal stenosis.Status post fundoplication, Botox injection, balloon dilation. I do not see that she had any cardiac issues during this hospitalization  Admitted to [facility] this admission 08/16/2019 with c/o on/off CP, HHRN noted pulse irregular, ranging 60-140 and recommended she go to the ER. She was found in AFib w/RVR New echo this admission noted EF up some to  40-45%, initially placed on dilt though changed to BB, and heparin gtt for a/c Looks like she is planned for TEE/DCCV, though ? If pending GI 1st given her history and ongoing nausea, as well as any anticoagulation recommendations, concerns, for final anticoagulation plan  EP is asked to weigh in on AAD options, noting severely dilated LA, likely to need drug to help maintain SR. Rhythm control preferred strategy with reduced LVEF ad recent CHF exacerbation.  Hospital Course:  83 y.o.femalewith medical history significant of hypertension, hyperlipidemia, systolic CHF, fibromyalgia, anemia, esophageal strictures/p balloon dilation, large symptomatic hiatal  hernia status post fundoplication, chronic dysphagia, asthma/COPD on oxygen at 2 L at home, CAD, TIA, bipolar disorderfibromyalgia, rheumatoid arthritis, TIA, chronic urinary incontinence.   She presented with heart rate 60s-140at home per home health nurse. Patient has not filled her medications since her discharge with still having some intermittent chest pain and she was told to come to emergency department. She reports while hospitalized she has an episode of tachycardia with HR p to 170's she was given a medicine and then her HR has come down. She has no known hx of A.fib  She took 2 nitro and 3/24 of aspirin and presented to emergency department.Chest pain report on and off for weeks, is worse with swallowing. Has been swallowing soft food ok.Reported burning pain in her chest radiating to her back and arms.She felt very weak and fell back wards no LOC.No fever no chills.Last night she felt sweaty her temp was 99.9.Reports Shortness of breath since last summerbut hasbeen getting progressively worse.  Relative recent history: Initially admitted on 10/3/20200with shortness of breath and chest pain was found to have lower lobe pneumonia thought to be secondary to aspiration COVID negative treated with broad-spectrum  antibiotics there was worrisome for esophageal stenosis GI was consulted and she underwent balloon dilatation and Botox injection Physical therapy seen her and recommended home health and discharge and she was discharged home on 9 October.  EKG showed no evidence of ischemic changes thought to be secondary to demand ischemia.     09/07/2019  f/u ov/Julie Rice re: doe  Chief Complaint  Patient presents with  . Hospitalization Follow-up    Pt states having SOB when she first wakes up in the am's. She is using o2 with sleep 2lpm. She is using her albuterol inhaler once per day on average.   Dyspnea:  Better p 1st hour but still  MMRC3 = can't walk 100 yards even at a slow pace at a flat grade s stopping due to sob  / hc parking  Cough: 3am couple times a week x months  Sleeping: rotated L side down two pillows  SABA use: not sure it's helping  02: 2lpm hs only  rec GERD diet    10/19/2019  f/u ov/Julie Rice re: doe improving  Chief Complaint  Patient presents with  . Follow-up    Patient is doing better than last visit. Patient is getting short of breath with daily activies. Patient wears 2L at night.  Dyspnea:  Mailbox and back is ok /flat 100 ft each way Cough: recurring around 3 am   like a tickle in throat/ non productive  Sleeping: sleeping on mattress wedge helps  SABA use: once day q am not clear why - doesn't really use it prn  02: 2lpm hs only   No obvious day to day or daytime variability or assoc excess/ purulent sputum or mucus plugs or hemoptysis or cp or chest tightness, subjective wheeze or overt sinus or hb symptoms.     Also denies any obvious fluctuation of symptoms with weather or environmental changes or other aggravating or alleviating factors except as outlined above   No unusual exposure hx or h/o childhood pna/ asthma or knowledge of premature birth.  Current Allergies, Complete Past Medical History, Past Surgical History, Family History, and Social History were  reviewed in [employer] record.  ROS  The following are not active complaints unless bolded Hoarseness, sore throat, dysphagia, dental problems, itching, sneezing,  nasal congestion or discharge of excess mucus or  purulent secretions, ear ache,   fever, chills, sweats, unintended wt loss or wt gain, classically pleuritic or exertional cp,  orthopnea pnd or arm/hand swelling  or leg swelling improved p lasix , presyncope, palpitations, abdominal pain, anorexia, nausea, vomiting, diarrhea  or change in bowel habits or change in bladder habits, change in stools or change in urine, dysuria, hematuria,  rash, arthralgias, visual complaints, headache, numbness, weakness or ataxia or problems with walking or coordination,  change in mood or  memory.        Current Meds  Medication Sig  . albuterol (VENTOLIN HFA) 108 (90 Base) MCG/ACT inhaler Inhale 2 puffs into the lungs every 6 (six) hours as needed for wheezing or shortness of breath.  Marland Kitchen. alum & mag hydroxide-simeth (MAALOX/MYLANTA) 200-200-20 MG/5ML suspension Take 30 mLs by mouth every 4 (four) hours as needed for indigestion.  Marland Kitchen. amiodarone (PACERONE) 200 MG tablet Take 1 tablet (200 mg total) by mouth daily.  Marland Kitchen. amLODipine (NORVASC) 2.5 MG tablet Take 2.5 mg by mouth daily.  Marland Kitchen. apixaban (ELIQUIS) 5 MG TABS tablet Take 1 tablet (5 mg total) by mouth 2 (two) times daily.  . carvedilol (COREG) 6.25 MG tablet Take 1 tablet (6.25 mg total) by mouth 2 (two) times daily with a meal.  . clonazePAM (KLONOPIN) 0.5 MG tablet TAKE 1 TABLET BY MOUTH 2 TIMES DAILY AS NEEDED FOR ANXIETY.  . furosemide (LASIX) 40 MG tablet Take 1 tablet (40 mg total) by mouth daily. Take 1 tablet by mouth once a day for 4 days then use as need for shortness of breath, weight gain or swelling  . gabapentin (NEURONTIN) 100 MG capsule TAKE 1 CAPSULE BY MOUTH THREE TIMES A DAY  . omeprazole (PRILOSEC) 20 MG capsule Take 1 capsule (20 mg total) by mouth daily.  .  sacubitril-valsartan (ENTRESTO) 49-51 MG Take 1 tablet by mouth 2 (two) times daily.  Marland Kitchen. spironolactone (ALDACTONE) 25 MG tablet Take 0.5 tablets (12.5 mg total) by mouth daily.                         Objective:   Physical Exam    10/19/2019     140   09/07/2019        136  02/05/2018         152  01/12/2018        159   12/31/17 160 lb 9.6 oz (72.8 kg)  12/29/17 162 lb (73.5 kg)  12/22/17 164 lb 4 oz (74.5 kg)       HEENT : pt wearing mask not removed for exam due to covid -19 concerns.    NECK :  without JVD/Nodes/TM/ nl carotid upstrokes bilaterally   LUNGS: no acc muscle use,  Nl contour chest which is clear to A and P bilaterally without cough on insp or exp maneuvers   CV:  RRR  no s3 or murmur or increase in P2, and  Trace bilateral LE pitting edema  ABD:  soft and nontender with nl inspiratory excursion in the supine position. No bruits or organomegaly appreciated, bowel sounds nl  MS:  Nl gait/ ext warm without deformities, calf tenderness, cyanosis or clubbing No obvious joint restrictions   SKIN: warm and dry without lesions    NEURO:  alert, approp, nl sensorium with  no motor or cerebellar deficits apparent.          CXR PA and Lateral:   10/19/2019 :    I personally  reviewed images and   impression as follows:  Cm with marked improvement in aeration bases, no effusion or overt CHF            Assessment:

## 2019-10-20 NOTE — Assessment & Plan Note (Signed)
PFTs 04/22/2019 min airflow obst  Chest CTa  08/17/2019 No evidence of aortic aneurysm or dissection. No evidence of pulmonary embolus. Moderate bilateral pleural effusions. Compressive atelectasis in the lower lobes.  Scattered reticulonodular densities and ground-glass nodular densities throughout the lungs are stable. Dilated, fluid-filled esophagus is stable. Echo 08/19/2019 1. Left ventricular ejection fraction, by visual estimation, is 25 to 30%. The left ventricle has severely decreased function. There is no left ventricular hypertrophy.  2. Global right ventricle has normal systolic function.The right ventricular size is normal.  3. Left atrial size was severely dilated.  4. Right atrial size was moderately dilated.  Amiodarone started 08/23/2019  - 09/07/2019   Walked RA x one lap =  approx 250 ft - stopped due to  Sob with sats 100%  chf clearly improved and getting back toward baseline with f/u cards planned  No evidence of amiodarone toxicity nor significant underlying lung dz.  Advised to monitor sats with walking/ f/u in 6 m but call sooner prn.

## 2019-10-20 NOTE — Assessment & Plan Note (Signed)
Onset while on Entresto  09/2019 "like a throat tickle"  Could be side effect of entresto reported 9% incidence which is higher than ACEi when originally reported but for now just a nuisance cough which is just as likely to be related to gerd so rec max gerd rx/ diet and f/u prn    I had an extended discussion with the patient reviewing all relevant studies completed to date and  lasting 15 to 20 minutes of a 25 minute visit    Each maintenance medication was reviewed in detail including most importantly the difference between maintenance and prns and under what circumstances the prns are to be triggered using an action plan format that is not reflected in the computer generated alphabetically organized AVS.     Please see AVS for specific instructions unique to this visit that I personally wrote and verbalized to the the pt in detail and then reviewed with pt  by my nurse highlighting any  changes in therapy recommended at today's visit to their plan of care.

## 2019-10-20 NOTE — Assessment & Plan Note (Signed)
No desats walking 09/07/2019   rx = 2lpm hs only as of  10/19/2019

## 2019-10-20 NOTE — Assessment & Plan Note (Signed)
PFTs 04/22/2019 min airflow obst  Chest CTa  08/17/2019 No evidence of aortic aneurysm or dissection. No evidence of pulmonary embolus. Moderate bilateral pleural effusions. Compressive atelectasis in the lower lobes.  Scattered reticulonodular densities and ground-glass nodular densities throughout the lungs are stable. Dilated, fluid-filled esophagus is stable. Echo 08/19/2019 1. Left ventricular ejection fraction, by visual estimation, is 25 to 30%. The left ventricle has severely decreased function. There is no left ventricular hypertrophy.  2. Global right ventricle has normal systolic function.The right ventricular size is normal.  3. Left atrial size was severely dilated.  4. Right atrial size was moderately dilated. - 09/07/2019   Walked RA x one lap =  approx 250 ft - stopped due to  Sob with sats 100%  Effusions resolved, likely due to chf

## 2019-10-21 ENCOUNTER — Other Ambulatory Visit: Payer: Self-pay | Admitting: Adult Health

## 2019-10-21 DIAGNOSIS — Z760 Encounter for issue of repeat prescription: Secondary | ICD-10-CM

## 2019-10-23 ENCOUNTER — Other Ambulatory Visit: Payer: Self-pay | Admitting: Adult Health

## 2019-10-24 NOTE — Progress Notes (Signed)
Cardiology Office Note:    Date:  10/25/2019   ID:  Margret ChanceDelores C Dilorenzo, DOB 07/11/1936, MRN [id number]  PCP:  [name], [name], NP  Cardiologist:  Charlton HawsPeter Nishan, MD  Referring MD: [name], [name], NP   No chief complaint on file.   History of Present Illness:    Margret ChanceDelores C Milligan is a 83 y.o. female with a past medical history significant for hypertension, hyperlipidemia, systolic CHF, fibromyalgia, anemia, esophageal stricture status post balloon dilatation, large symptomatic hiatal hernia status post fundoplication, chronic dysphagia, asthma/COPD on oxygen at 2 L at home, bipolar disorder, rheumatoid arthritis, TIA and chronic urinary incontinence.  Right and left heart cath on 07/26/2019 showed only mild nonobstructive disease and low right heart pressures.  The patient was admitted to the hospital 08/16/2019 with complaints of off-and-on chest pain and noted to have irregular pulse by home health nurse.  She was found to be in atrial fibrillation with RVR.  Echocardiogram showed EF 40-45%.  She was initially placed on diltiazem but changed to beta-blocker.  She had severe symptoms she was placed on amiodarone for rhythm control.  She was planned for electrical cardioversion but converted to sinus rhythm prior. CHADSVASC score is 7. She was started on Eliquis for stroke risk reduction.  Her QTC was elevated at 535 in the hospital and amiodarone was reduced and she was also taken off of Zofran.  I saw the patient in the office on 09/08/2019 for hospital follow-up at which time she was slowly getting stronger.  She was having some home [employer] with guided exercises.  BNP was noted to be slightly elevated at pulmonology office.  Her weight had come down and and she only had mild lower extremity puffiness.  She did have some shortness of breath the mornings and crackles in her left lung base. Her elevated BNP may have been in part related to [facility].  I did have her take Lasix 40 mg x 4 days.  I called  her during that time and she did report that she was feeling better.  She was maintaining sinus rhythm with a QTC of 499.  I decreased her amiodarone to 200 mg daily for maintenance dose.  I saw the patient again on 09/22/2019 for close follow-up and possible up titration of Entresto. She was still working with PT and walking outside some. She was still using oxygen at night.  She was taking as needed Lasix and was slowly improving.  She was maintaining sinus rhythm.  The patient sent a MyChart message on 10/09/2019 with complaints of swollen feet.  She was advised to take her Lasix every day.  She was seen by pulmonology on 10/19/2019.  She had no desats with walking.  Oxygen was switched to at bedtime only.  She was noted to have a tickle in her throat, nuisance cough, that may be [facility] to [facility].  She was also advised on GERD therapy and diet.  Chest x-ray on that day showed interval resolution of pleural fluid and airspace disease, cardiomegaly without congestive failure.  The patient is here today for further evaluation of her swelling. She had been taking lasix as needed and then developed sudden swelling of bother feet and ankles. She shows me pictures on her phone and it was quite significant. She is now taking the lasix 40 mg every day with significant improvement. She has mild edema today.   She continues to have mild DOE, mostly when she first gets up in the morning.   Cardiac  studies   TEE 08/19/2019 IMPRESSIONS  1. Left ventricular ejection fraction, by visual estimation, is 25 to 30%. The left ventricle has severely decreased function. There is no left ventricular hypertrophy. 2. Global right ventricle has normal systolic function.The right ventricular size is normal. 3. Left atrial size was severely dilated. 4. Right atrial size was moderately dilated. 5. Mild mitral annular calcification. 6. The mitral valve is normal in structure. Moderate mitral valve  regurgitation. 7. The tricuspid valve is normal in structure. Tricuspid valve regurgitation moderate. 8. The aortic valve is tricuspid Aortic valve regurgitation is mild by color flow Doppler. Mild aortic valve sclerosis without stenosis. 9. The pulmonic valve was grossly normal. Pulmonic valve regurgitation is mild by color flow Doppler. 10. Mild plaque invoving the descending aorta. 11. Severe global reduction in LV systolic function; biatrial enlargement; atrial septal aneurysm; no LAA thrombus; mild AI; moderate MR and TR.   Echocardiogram 08/17/2019 IMPRESSIONS 1. Left ventricular ejection fraction, by visual estimation, is 45 to 50%. Assessment of LV function complicated by atrial fibrillation, but overall appears mildly decreased global systolic function. Normal left ventricular size. There is mildly  increased left ventricular hypertrophy. 2. Left ventricular diastolic Doppler parameters are indeterminate pattern of LV diastolic filling. 3. Global right ventricle has normal systolic function.The right ventricular size is normal. No increase in right ventricular wall thickness. 4. Left atrial size was severely dilated. 5. Right atrial size was moderately dilated. 6. Moderate pleural effusion in the left lateral region. 7. Trivial pericardial effusion is present. 8. The mitral valve is abnormal. Mild to moderate mitral valve regurgitation. EROA 0.15, RV 23cc 9. The tricuspid valve is normal in structure. Tricuspid valve regurgitation is mild. 10. The aortic valve is tricuspid Aortic valve regurgitation is mild by color flow Doppler. 11. Mildly elevated pulmonary artery systolic pressure. RVSP 42 mmHg 12. The inferior vena cava is dilated in size with >50% respiratory variability, suggesting right atrial pressure of 8 mmHg.  RIGHT/LEFT HEART CATH AND CORONARY ANGIOGRAPHY  07/26/2019  Conclusion  Right dominant coronary anatomy  Widely patent left main  30 to 40% mid  LAD  Widely patent circumflex  Luminal irregularities in a dominant right coronary.  Right heart pressures are relatively low. Pulmonary wedge pressure mean is 2 mmHg.  LVEDP 4 mmHg. Left ventriculography by hand-injection was not helpful. From images obtained, LV function appears relatively normal.  RECOMMENDATIONS:  Per treating team.  Relatively volume contracted and therefore we will give normal saline 100 cc/h for the next 4 hours.     Past Medical History:  Diagnosis Date  . Anemia   . Anxiety   . Bipolar disorder ([place])   . Blood transfusion 1991   autologous pts own blood given   . CAD (coronary artery disease)   . Cataract   . Chest pain    "@ rest, lying down, w/exertion"  . Chronic back pain    "mostly lower back but I do have upper back pain regularly" (04/06/2018)  . COPD (chronic obstructive pulmonary disease) ([place])   . Depression   . Esophageal stricture   . Fibromyalgia   . GERD (gastroesophageal reflux disease)   . Heart murmur    "slight" (04/06/2018)  . Hiatal hernia   . Hyperlipidemia    Patient denies  . Hypertension   . Internal hemorrhoids   . Lumbago   . Mitral regurgitation   . Osteoarthritis   . Osteoporosis   . Pneumonia ~ 01/2018  . PONV (postoperative nausea  and vomiting)    severe ponv, "in the past" (04/06/2018)  . Rectal bleeding   . Rheumatoid arthritis (HCC)   . Spondylosis   . TIA (transient ischemic attack) 2013  . Urinary incontinence    wears depends    Past Surgical History:  Procedure Laterality Date  . ABDOMINAL HYSTERECTOMY  1982  . BACK SURGERY    . BALLOON DILATION N/A 09/21/2018   Procedure: BALLOON DILATION;  Surgeon: Hilarie Fredrickson, MD;  Location: [facility] ENDOSCOPY;  Service: Endoscopy;  Laterality: N/A;  . BALLOON DILATION N/A 08/12/2019   Procedure: BALLOON DILATION;  Surgeon: Lynann Bologna, MD;  Location: [facility] ENDOSCOPY;  Service: Endoscopy;  Laterality: N/A;  . BIOPSY  08/12/2019   Procedure: BIOPSY;  Surgeon:  Lynann Bologna, MD;  Location: [facility] ENDOSCOPY;  Service: Endoscopy;;  . BLADDER SUSPENSION  1980's  . BOTOX INJECTION N/A 09/21/2018   Procedure: BOTOX INJECTION;  Surgeon: Hilarie Fredrickson, MD;  Location: [facility] ENDOSCOPY;  Service: Endoscopy;  Laterality: N/A;  . BOTOX INJECTION N/A 08/12/2019   Procedure: BOTOX INJECTION;  Surgeon: Lynann Bologna, MD;  Location: [facility] ENDOSCOPY;  Service: Endoscopy;  Laterality: N/A;  . CATARACT EXTRACTION W/ INTRAOCULAR LENS  IMPLANT, BILATERAL Bilateral 2010  . DILATION AND CURETTAGE OF UTERUS  1961  . ESOPHAGEAL MANOMETRY N/A 03/25/2018   Procedure: ESOPHAGEAL MANOMETRY (EM);  Surgeon: Napoleon Form, MD;  Location: [facility] ENDOSCOPY;  Service: Endoscopy;  Laterality: N/A;  . ESOPHAGOGASTRODUODENOSCOPY (EGD) WITH PROPOFOL N/A 04/07/2018   Procedure: ESOPHAGOGASTRODUODENOSCOPY (EGD) WITH PROPOFOL;  Surgeon: Sherrilyn Rist, MD;  Location: [facility] ENDOSCOPY;  Service: Gastroenterology;  Laterality: N/A;  . ESOPHAGOGASTRODUODENOSCOPY (EGD) WITH PROPOFOL N/A 09/21/2018   Procedure: ESOPHAGOGASTRODUODENOSCOPY (EGD) WITH PROPOFOL;  Surgeon: Hilarie Fredrickson, MD;  Location: [facility] ENDOSCOPY;  Service: Endoscopy;  Laterality: N/A;  . ESOPHAGOGASTRODUODENOSCOPY (EGD) WITH PROPOFOL N/A 08/12/2019   Procedure: ESOPHAGOGASTRODUODENOSCOPY (EGD) WITH PROPOFOL;  Surgeon: Lynann Bologna, MD;  Location: [facility] ENDOSCOPY;  Service: Endoscopy;  Laterality: N/A;  . HERNIA REPAIR    . HIATAL HERNIA REPAIR N/A 08/02/2016   Procedure: LAPAROSCOPIC REPAIR OF LARGE  HIATAL HERNIA;  Surgeon: Glenna Fellows, MD;  Location: [facility] ORS;  Service: General;  Laterality: N/A;  . JOINT REPLACEMENT    . LAPAROSCOPIC NISSEN FUNDOPLICATION N/A 08/02/2016   Procedure: LAPAROSCOPIC NISSEN FUNDOPLICATION;  Surgeon: Glenna Fellows, MD;  Location: [facility] ORS;  Service: General;  Laterality: N/A;  . LUMBAR LAMINECTOMY  1990; 1994; [id number]   "I've got 2 stainless steel rods; 6 screws; 2 ray cages"took bone from right hip to put in  back  . RIGHT/LEFT HEART CATH AND CORONARY ANGIOGRAPHY N/A 07/26/2019   Procedure: RIGHT/LEFT HEART CATH AND CORONARY ANGIOGRAPHY;  Surgeon: Lyn Records, MD;  Location: [facility] INVASIVE CV LAB;  Service: Cardiovascular;  Laterality: N/A;  . TEE WITHOUT CARDIOVERSION N/A 08/19/2019   Procedure: TRANSESOPHAGEAL ECHOCARDIOGRAM (TEE);  Surgeon: Lewayne Bunting, MD;  Location: [facility] ENDOSCOPY;  Service: Cardiovascular;  Laterality: N/A;  . TONSILLECTOMY AND ADENOIDECTOMY  1945  . TOTAL KNEE ARTHROPLASTY Right ~ 1996  . TUBAL LIGATION  ~ 1976    Current Medications: Current Meds  Medication Sig  . albuterol (VENTOLIN HFA) 108 (90 Base) MCG/ACT inhaler Inhale 2 puffs into the lungs every 6 (six) hours as needed for wheezing or shortness of breath.  Marland Kitchen alum & mag hydroxide-simeth (MAALOX/MYLANTA) 200-200-20 MG/5ML suspension Take 30 mLs by mouth every 4 (four) hours as needed for indigestion.  Marland Kitchen amiodarone (PACERONE) 200 MG tablet Take 1  tablet (200 mg total) by mouth daily.  Marland Kitchen. amLODipine (NORVASC) 2.5 MG tablet Take 2.5 mg by mouth daily.  Marland Kitchen. apixaban (ELIQUIS) 5 MG TABS tablet Take 1 tablet (5 mg total) by mouth 2 (two) times daily.  . carvedilol (COREG) 6.25 MG tablet Take 1 tablet (6.25 mg total) by mouth 2 (two) times daily with a meal.  . clonazePAM (KLONOPIN) 0.5 MG tablet TAKE 1 TABLET BY MOUTH 2 TIMES DAILY AS NEEDED FOR ANXIETY.  . famotidine (PEPCID) 20 MG tablet One after supper  . furosemide (LASIX) 40 MG tablet Take 1 tablet (40 mg total) by mouth daily. Take 1 tablet by mouth once a day. You may take an extra tablet as needed at 2 PM for increased swelling  . gabapentin (NEURONTIN) 100 MG capsule TAKE 1 CAPSULE BY MOUTH THREE TIMES A DAY  . omeprazole (PRILOSEC) 20 MG capsule Take 1 capsule (20 mg total) by mouth daily.  Marland Kitchen. spironolactone (ALDACTONE) 25 MG tablet Take 0.5 tablets (12.5 mg total) by mouth daily.  . [DISCONTINUED] furosemide (LASIX) 40 MG tablet Take 1 tablet (40 mg total) by  mouth daily. Take 1 tablet by mouth once a day for 4 days then use as need for shortness of breath, weight gain or swelling  . [DISCONTINUED] sacubitril-valsartan (ENTRESTO) 49-51 MG Take 1 tablet by mouth 2 (two) times daily.     Allergies:   Morphine and Tape   Social History   Socioeconomic History  . Marital status: Widowed    Spouse name: Not on file  . Number of children: 2  . Years of education: Not on file  . Highest education level: Not on file  Occupational History    Employer: RETIRED  Tobacco Use  . Smoking status: Never Smoker  . Smokeless tobacco: Never Used  Substance and Sexual Activity  . Alcohol use: Never  . Drug use: Never  . Sexual activity: Not Currently  Other Topics Concern  . Not on file  Social History Narrative   Retired    Widowed   Social Determinants of Corporate investment bankerHealth   Financial Resource Strain:   . Difficulty of Paying Living Expenses: Not on file  Food Insecurity:   . Worried About Programme researcher, broadcasting/film/videounning Out of Food in the Last Year: Not on file  . Ran Out of Food in the Last Year: Not on file  Transportation Needs:   . Lack of Transportation (Medical): Not on file  . Lack of Transportation (Non-Medical): Not on file  Physical Activity:   . Days of Exercise per Week: Not on file  . Minutes of Exercise per Session: Not on file  Stress:   . Feeling of Stress : Not on file  Social Connections:   . Frequency of Communication with Friends and Family: Not on file  . Frequency of Social Gatherings with Friends and Family: Not on file  . Attends Religious Services: Not on file  . Active Member of Clubs or Organizations: Not on file  . Attends BankerClub or Organization Meetings: Not on file  . Marital Status: Not on file     Family History: The patient's family history includes Heart attack in her brother; Heart disease in her brother; Heart disease (age of onset: 6867) in her father; Hypertension in her mother; Kidney disease in her mother; Suicidality in her son.  There is no history of Colon cancer, Esophageal cancer, Pancreatic cancer, Rectal cancer, or Stomach cancer. ROS:   Please see the history of present illness.  All other systems reviewed and are negative.   EKG:  EKG is ordered today.  The ekg ordered today demonstrates sinus rhythm, LAD, LBBB, 63 bpm, machine read QTC is 519 however by my measurement QTC is more like 440.  Recent Labs: 08/17/2019: B Natriuretic Peptide 599.2 08/23/2019: Magnesium 1.8 09/07/2019: ALT 10; Hemoglobin 11.0; Platelets 218.0; Pro B Natriuretic peptide (BNP) 1,473.0; TSH 3.58 10/06/2019: BUN 17; Creatinine, Ser 0.91; Potassium 4.4; Sodium 143   Recent Lipid Panel    Component Value Date/Time   CHOL 127 08/19/2019 0429   TRIG 103 08/19/2019 0429   HDL 42 08/19/2019 0429   CHOLHDL 3.0 08/19/2019 0429   VLDL 21 08/19/2019 0429   LDLCALC 64 08/19/2019 0429   LDLDIRECT 122.2 06/01/2013 0944    Physical Exam:    VS:  BP (!) 142/64   Pulse 66   Ht 5' 1.5" (1.562 m)   Wt 141 lb 6.4 oz (64.1 kg)   SpO2 99%   BMI 26.28 kg/m     Wt Readings from Last 6 Encounters:  10/25/19 141 lb 6.4 oz (64.1 kg)  10/19/19 140 lb 9.6 oz (63.8 kg)  09/24/19 135 lb (61.2 kg)  09/22/19 135 lb (61.2 kg)  09/08/19 137 lb (62.1 kg)  09/07/19 136 lb (61.7 kg)     Physical Exam  Constitutional: She is oriented to person, place, and time. She appears well-developed and well-nourished. No distress.  HENT:  Head: Normocephalic and atraumatic.  Neck: No JVD present.  Cardiovascular: Normal rate, regular rhythm, normal heart sounds and intact distal pulses. Exam reveals no gallop and no friction rub.  No murmur heard. Pulmonary/Chest: Effort normal and breath sounds normal. No respiratory distress. She has no wheezes. She has no rales.  Abdominal: Soft. Bowel sounds are normal.  Musculoskeletal:        General: Edema present. Normal range of motion.     Cervical back: Normal range of motion and neck supple.     Comments:  Trace puffiness of ankles  Neurological: She is alert and oriented to person, place, and time.  Skin: Skin is warm and dry.  Psychiatric: She has a normal mood and affect. Her behavior is normal. Judgment and thought content normal.  Vitals reviewed.   ASSESSMENT:    1. Chronic systolic congestive heart failure (HCC)   2. Paroxysmal atrial fibrillation (HCC)   3. Prolonged Q-T interval on ECG   4. Coronary artery disease involving native coronary artery of native heart without angina pectoris   5. Esophageal dysphagia   6. Medication management    PLAN:    In order of problems listed above:  Chronic systolic heart failure: -LVEF 25 to 30% by recent TEE.  TTE just 3 days priorshowed EF 45-50%.   -Now on coreg, Entresto, spiro.  -BNP on 11/3 was elevated at 1473 (which can be elevated with Entresto so it is unclear if this is actually an elevation or her new baseline)  -With improvement in her swelling we had decreased her Lasix to as needed in the setting of increasing her Entresto.  However she developed significant lower leg edema and was restarted on Lasix 40 mg daily  -Entresto was uptitrated to 49/51 mg twice daily.  Labs stable. -Chest x-ray on 10/19/2019 was clear. -Blood pressure is stable.  I will further uptitrate her Entresto 97/103 mg twice daily and follow-up [facility] next week.  -She will continue to take Lasix 40 mg daily with an occasional extra 40 mg in  the afternoon for increased swelling.  Atrial fibrillation: -Recent hospitalization with finding of new onset atrial fibrillation with RVR.  The patient was very symptomatic so rhythm control strategy was adopted and she has been placed on amiodarone, which has been reduced to 200 mg daily maintenance dose. -The patient continues in sinus rhythm and has not had no perceived recurrences of A. fib.   -CHADSVASC score is 7.  Patient continues on Eliquis. No unusual bleeding.   Nonobstructive CAD: -by cardiac  catheterization on September 21.  Chronic Nausea/dysphagia: -Complicated history. She had esophageal dilatation and botox during recent hospitalization.  She reports that her swallowing is better.  -Followed by GI, on PPI.   NSVT: -Patient had some episodes of NSVT in the hospital on telemetry.  On beta-blocker. No perceived recurrences.    Medication Adjustments/Labs and Tests Ordered: Current medicines are reviewed at length with the patient today.  Concerns regarding medicines are outlined above. Labs and tests ordered and medication changes are outlined in the patient instructions below:  Patient Instructions  Medication Instructions:  1) We have increased your Entresto to 97-103 mg twice a day, A new prescription has been sent to your pharmacy  2) You may take an extra tablet as needed of your Lasix at 2 PM for increased swelling     *If you need a refill on your cardiac medications before your next appointment, please call your pharmacy*  Lab Work: BMET- Today   Return to our office on 11/08/2019 for a repeat BMET   If you have labs (blood work) drawn today and your tests are completely normal, you will receive your results only by: Marland Kitchen MyChart Message (if you have MyChart) OR . A paper copy in the mail If you have any lab test that is abnormal or we need to change your treatment, we will call you to review the results.  Testing/Procedures: None ordered   Follow-Up: Keep follow up appointment with Lizabeth Leyden, NP on 11/11/2019 @ 2:30 PM  Other Instructions  We recommend that you wear compressions stockings, Put them on first thing in the morning when you wake up   How to Use Compression Stockings Compression stockings are elastic socks that squeeze the legs. They help increase blood flow (circulation) to the legs, decrease swelling in the legs, and reduce the chance of developing blood clots in the lower legs. Compression stockings are often used by people  who:  Are recovering from surgery.  Have poor circulation in their legs.  Tend to get blood clots in their legs.  Have bulging (varicose) veins.  Sit or stay in bed for long periods of time. Follow instructions from your health care provider about how and when to wear your compression stockings. How to wear compression stockings Before you put on your compression stockings:  Make sure that they are the correct size and degree of compression. If you do not know your size or required grade of compression, ask your health care provider and follow the manufacturer's instructions that come with the stockings.  Make sure that they are clean, dry, and in good condition.  Check them for rips and tears. Do not put them on if they are ripped or torn. Put your stockings on first thing in the morning, before you get out of bed. Keep them on for as long as your health care provider advises. When you are wearing your stockings:  Keep them as smooth as possible. Do not allow them to bunch up. It  is especially important to prevent the stockings from bunching up around your toes or behind your knees.  Do not roll the stockings downward and leave them rolled down. This can decrease blood flow to your leg.  Change them right away if they become wet or dirty. When you take off your stockings, inspect your legs and feet. Check for:  Open sores.  Red spots.  Swelling. General tips  Do not stop wearing compression stockings without talking to your health care provider first.  Wash your stockings every day with mild detergent in cold or warm water. Do not use bleach. Air-dry your stockings or dry them in a clothes dryer on low heat. It may be helpful to have two pairs so that you have a pair to wear while the other is being washed.  Replace your stockings every 3-6 months.  If skin moisturizing is part of your treatment plan, apply lotion or cream at night so that your skin will be dry when you put  on the stockings in the morning. It is harder to put the stockings on when you have lotion on your legs or feet.  Wear nonskid shoes or slip-resistant socks when walking while wearing compression stockings. Contact a health care provider and remove your stockings if you have:  A feeling of pins and needles in your feet or legs.  Open sores, red spots, or other skin changes on your feet or legs.  Swelling or pain that gets worse. Get help right away if you have:  Numbness or tingling in your lower legs that does not get better right after you take the stockings off.  Toes or feet that are unusually cold or turn a bluish color.  A warm or red area on your leg.  New swelling or soreness in your leg.  Shortness of breath.  Chest pain.  A fast or irregular heartbeat.  Light-headedness.  Dizziness. Summary  Compression stockings are elastic socks that squeeze the legs.  They help increase blood flow (circulation) to the legs, decrease swelling in the legs, and reduce the chance of developing blood clots in the lower legs.  Follow instructions from your health care provider about how and when to wear your compression stockings.  Do not stop wearing your compression stockings without talking to your health care provider first. This information is not intended to replace advice given to you by your health care provider. Make sure you discuss any questions you have with your health care provider. Document Released: 08/18/2009 Document Revised: 10/23/2017 Document Reviewed: 10/23/2017 Elsevier Patient Education  2020 [employer].     Signed, Berton Bon, NP  10/25/2019 4:33 PM    [facility]

## 2019-10-25 ENCOUNTER — Encounter: Payer: Self-pay | Admitting: Cardiology

## 2019-10-25 ENCOUNTER — Ambulatory Visit: Payer: Medicare Other | Admitting: Cardiology

## 2019-10-25 ENCOUNTER — Other Ambulatory Visit: Payer: Self-pay

## 2019-10-25 VITALS — BP 142/64 | HR 66 | Ht 61.5 in | Wt 141.4 lb

## 2019-10-25 DIAGNOSIS — R1310 Dysphagia, unspecified: Secondary | ICD-10-CM

## 2019-10-25 DIAGNOSIS — I480 Paroxysmal atrial fibrillation: Secondary | ICD-10-CM

## 2019-10-25 DIAGNOSIS — Z79899 Other long term (current) drug therapy: Secondary | ICD-10-CM | POA: Diagnosis not present

## 2019-10-25 DIAGNOSIS — R9431 Abnormal electrocardiogram [ECG] [EKG]: Secondary | ICD-10-CM

## 2019-10-25 DIAGNOSIS — I2510 Atherosclerotic heart disease of native coronary artery without angina pectoris: Secondary | ICD-10-CM

## 2019-10-25 DIAGNOSIS — R1319 Other dysphagia: Secondary | ICD-10-CM

## 2019-10-25 DIAGNOSIS — I5022 Chronic systolic (congestive) heart failure: Secondary | ICD-10-CM | POA: Diagnosis not present

## 2019-10-25 MED ORDER — FUROSEMIDE 40 MG PO TABS: 40.0000 mg | ORAL_TABLET | Freq: Every day | ORAL | 3 refills | Status: DC

## 2019-10-25 MED ORDER — ENTRESTO 97-103 MG PO TABS: 1.0000 | ORAL_TABLET | Freq: Two times a day (BID) | ORAL | 4 refills | Status: DC

## 2019-10-25 NOTE — Patient Instructions (Addendum)
Medication Instructions:  1) We have increased your Entresto to 97-103 mg twice a day, A new prescription has been sent to your pharmacy  2) You may take an extra tablet as needed of your Lasix at 2 PM for increased swelling     *If you need a refill on your cardiac medications before your next appointment, please call your pharmacy*  Lab Work: BMET- Today   Return to our office on 11/08/2019 for a repeat BMET   If you have labs (blood work) drawn today and your tests are completely normal, you will receive your results only by: Marland Kitchen MyChart Message (if you have MyChart) OR . A paper copy in the mail If you have any lab test that is abnormal or we need to change your treatment, we will call you to review the results.  Testing/Procedures: None ordered   Follow-Up: Keep follow up appointment with Pecolia Ades, NP on 11/11/2019 @ 2:30 PM  Other Instructions  We recommend that you wear compressions stockings, Put them on first thing in the morning when you wake up   How to Use Compression Stockings Compression stockings are elastic socks that squeeze the legs. They help increase blood flow (circulation) to the legs, decrease swelling in the legs, and reduce the chance of developing blood clots in the lower legs. Compression stockings are often used by people who:  Are recovering from surgery.  Have poor circulation in their legs.  Tend to get blood clots in their legs.  Have bulging (varicose) veins.  Sit or stay in bed for long periods of time. Follow instructions from your health care provider about how and when to wear your compression stockings. How to wear compression stockings Before you put on your compression stockings:  Make sure that they are the correct size and degree of compression. If you do not know your size or required grade of compression, ask your health care provider and follow the manufacturer's instructions that come with the stockings.  Make sure that they  are clean, dry, and in good condition.  Check them for rips and tears. Do not put them on if they are ripped or torn. Put your stockings on first thing in the morning, before you get out of bed. Keep them on for as long as your health care provider advises. When you are wearing your stockings:  Keep them as smooth as possible. Do not allow them to bunch up. It is especially important to prevent the stockings from bunching up around your toes or behind your knees.  Do not roll the stockings downward and leave them rolled down. This can decrease blood flow to your leg.  Change them right away if they become wet or dirty. When you take off your stockings, inspect your legs and feet. Check for:  Open sores.  Red spots.  Swelling. General tips  Do not stop wearing compression stockings without talking to your health care provider first.  Wash your stockings every day with mild detergent in cold or warm water. Do not use bleach. Air-dry your stockings or dry them in a clothes dryer on low heat. It may be helpful to have two pairs so that you have a pair to wear while the other is being washed.  Replace your stockings every 3-6 months.  If skin moisturizing is part of your treatment plan, apply lotion or cream at night so that your skin will be dry when you put on the stockings in the morning. It is  harder to put the stockings on when you have lotion on your legs or feet.  Wear nonskid shoes or slip-resistant socks when walking while wearing compression stockings. Contact a health care provider and remove your stockings if you have:  A feeling of pins and needles in your feet or legs.  Open sores, red spots, or other skin changes on your feet or legs.  Swelling or pain that gets worse. Get help right away if you have:  Numbness or tingling in your lower legs that does not get better right after you take the stockings off.  Toes or feet that are unusually cold or turn a bluish  color.  A warm or red area on your leg.  New swelling or soreness in your leg.  Shortness of breath.  Chest pain.  A fast or irregular heartbeat.  Light-headedness.  Dizziness. Summary  Compression stockings are elastic socks that squeeze the legs.  They help increase blood flow (circulation) to the legs, decrease swelling in the legs, and reduce the chance of developing blood clots in the lower legs.  Follow instructions from your health care provider about how and when to wear your compression stockings.  Do not stop wearing your compression stockings without talking to your health care provider first. This information is not intended to replace advice given to you by your health care provider. Make sure you discuss any questions you have with your health care provider. Document Released: 08/18/2009 Document Revised: 10/23/2017 Document Reviewed: 10/23/2017 Elsevier Patient Education  2020 [employer].

## 2019-10-26 DIAGNOSIS — J449 Chronic obstructive pulmonary disease, unspecified: Secondary | ICD-10-CM | POA: Diagnosis not present

## 2019-10-26 DIAGNOSIS — J9611 Chronic respiratory failure with hypoxia: Secondary | ICD-10-CM | POA: Diagnosis not present

## 2019-10-26 LAB — BASIC METABOLIC PANEL
BUN/Creatinine Ratio: 15 (ref 12–28)
BUN: 15 mg/dL (ref 8–27)
CO2: 29 mmol/L (ref 20–29)
Calcium: 8.9 mg/dL (ref 8.7–10.3)
Chloride: 99 mmol/L (ref 96–106)
Creatinine, Ser: 0.99 mg/dL (ref 0.57–1.00)
GFR calc Af Amer: 62 mL/min/{1.73_m2} (ref >59)
GFR calc non Af Amer: 54 mL/min/{1.73_m2} — ABNORMAL LOW (ref >59)
Glucose: 114 mg/dL — ABNORMAL HIGH (ref 65–99)
Potassium: 3.7 mmol/L (ref 3.5–5.2)
Sodium: 142 mmol/L (ref 134–144)

## 2019-10-26 NOTE — Telephone Encounter (Signed)
She should not be taking this medication any longer. If she still is, we will need to wean her off and start something else

## 2019-10-27 MED ORDER — OMEPRAZOLE 20 MG PO CPDR: 20.0000 mg | DELAYED_RELEASE_CAPSULE | Freq: Every day | ORAL | 3 refills | Status: DC

## 2019-10-31 ENCOUNTER — Other Ambulatory Visit: Payer: Self-pay | Admitting: Adult Health

## 2019-11-01 ENCOUNTER — Other Ambulatory Visit: Payer: Self-pay | Admitting: Adult Health

## 2019-11-02 ENCOUNTER — Encounter: Payer: Self-pay | Admitting: Adult Health

## 2019-11-02 NOTE — Telephone Encounter (Signed)
DENIED.  PT IS NO LONGER TAKING THIS MEDICATION. 

## 2019-11-03 ENCOUNTER — Other Ambulatory Visit: Payer: Self-pay | Admitting: Family Medicine

## 2019-11-03 MED ORDER — AMLODIPINE BESYLATE 2.5 MG PO TABS: 2.5000 mg | ORAL_TABLET | Freq: Every day | ORAL | 0 refills | Status: DC

## 2019-11-03 NOTE — Telephone Encounter (Signed)
I do not see any mention of d/c norvasc in hospital discharge note or by Cardiology. OK to fill for 90 days

## 2019-11-03 NOTE — Telephone Encounter (Signed)
DENIED.  FILLED FOR 1 YEAR IN November BY CARDIOLOGY.

## 2019-11-03 NOTE — Telephone Encounter (Signed)
Last filled July 2019.  Is patient still taking?

## 2019-11-03 NOTE — Telephone Encounter (Signed)
Sent to the pharmacy by e-scribe. 

## 2019-11-08 ENCOUNTER — Other Ambulatory Visit: Payer: Self-pay

## 2019-11-08 ENCOUNTER — Other Ambulatory Visit: Payer: Medicare Other | Admitting: *Deleted

## 2019-11-08 DIAGNOSIS — Z79899 Other long term (current) drug therapy: Secondary | ICD-10-CM

## 2019-11-08 DIAGNOSIS — I5022 Chronic systolic (congestive) heart failure: Secondary | ICD-10-CM | POA: Diagnosis not present

## 2019-11-09 ENCOUNTER — Other Ambulatory Visit: Payer: Self-pay

## 2019-11-09 LAB — BASIC METABOLIC PANEL
BUN/Creatinine Ratio: 13 (ref 12–28)
BUN: 11 mg/dL (ref 8–27)
CO2: 27 mmol/L (ref 20–29)
Calcium: 8.9 mg/dL (ref 8.7–10.3)
Chloride: 105 mmol/L (ref 96–106)
Creatinine, Ser: 0.86 mg/dL (ref 0.57–1.00)
GFR calc Af Amer: 73 mL/min/{1.73_m2} (ref >59)
GFR calc non Af Amer: 64 mL/min/{1.73_m2} (ref >59)
Glucose: 98 mg/dL (ref 65–99)
Potassium: 4.2 mmol/L (ref 3.5–5.2)
Sodium: 142 mmol/L (ref 134–144)

## 2019-11-09 MED ORDER — SPIRONOLACTONE 25 MG PO TABS: 25.0000 mg | ORAL_TABLET | Freq: Every day | ORAL | 3 refills | Status: DC

## 2019-11-11 ENCOUNTER — Other Ambulatory Visit: Payer: Self-pay

## 2019-11-11 ENCOUNTER — Ambulatory Visit: Payer: Medicare Other | Admitting: Cardiology

## 2019-11-11 ENCOUNTER — Encounter: Payer: Self-pay | Admitting: Cardiology

## 2019-11-11 VITALS — BP 150/60 | HR 68 | Ht 61.5 in | Wt 142.0 lb

## 2019-11-11 DIAGNOSIS — I480 Paroxysmal atrial fibrillation: Secondary | ICD-10-CM | POA: Diagnosis not present

## 2019-11-11 DIAGNOSIS — I5022 Chronic systolic (congestive) heart failure: Secondary | ICD-10-CM

## 2019-11-11 DIAGNOSIS — I2510 Atherosclerotic heart disease of native coronary artery without angina pectoris: Secondary | ICD-10-CM

## 2019-11-11 NOTE — Progress Notes (Signed)
Cardiology Office Note:    Date:  11/11/2019   ID:  Julie Rice, DOB Aug 02, 1936, MRN [id number]  PCP:  Shirline Frees, NP  Cardiologist:  Charlton Haws, MD  Referring MD: Shirline Frees, NP   No chief complaint on file.   History of Present Illness:    Julie Rice is a 84 y.o. female with a past medical history significant for hypertension, hyperlipidemia, systolic CHF, fibromyalgia, anemia, esophageal stricture status post balloon dilatation, large symptomatic hiatal hernia status post fundoplication, chronic dysphagia, asthma/COPD on oxygen at 2 L at home, bipolar disorder, rheumatoid arthritis, TIA and chronic urinary incontinence.  Right and left heart cath on 07/26/2019 showed only mild nonobstructive disease and low right heart pressures.  The patient was admitted to the hospital 08/16/2019 with complaints of off-and-on chest pain and noted to have irregular pulse by home health nurse.  She was found to be in atrial fibrillation with RVR.  Echocardiogram showed EF 40-45%.  She was initially placed on diltiazem but changed to beta-blocker.  She had severe symptoms she was placed on amiodarone for rhythm control.  She was planned for electrical cardioversion but converted to sinus rhythm prior. CHADSVASC score is 7. She was started on Eliquis for stroke risk reduction.  Her QTC was elevated at 535 in the hospital and amiodarone was reduced and she was also taken off of Zofran.  I saw the patient in the office on 09/08/2019 for hospital follow-up at which time she was slowly getting stronger.  She was having some home [employer] with guided exercises.  BNP was noted to be slightly elevated at pulmonology office.  Her weight had come down and and she only had mild lower extremity puffiness.  She did have some shortness of breath the mornings and crackles in her left lung base. Her elevated BNP may have been in part related to [facility].  I did have her take Lasix 40 mg x 4 days.  I called  her during that time and she did report that she was feeling better.  She was maintaining sinus rhythm with a QTC of 499.  I decreased her amiodarone to 200 mg daily for maintenance dose.  I saw the patient again on 09/22/2019 for close follow-up and possible up titration of Entresto. She was still working with PT and walking outside some. She was still using oxygen at night.  She was taking as needed Lasix and was slowly improving.  She was maintaining sinus rhythm.  The patient sent a MyChart message on 10/09/2019 with complaints of swollen feet.  She was advised to take her Lasix every day.  She was seen by pulmonology on 10/19/2019.  She had no desats with walking.  Oxygen was switched to at bedtime only.  She was noted to have a tickle in her throat, nuisance cough, that may be related to [facility].  She was also advised on GERD therapy and diet.  Chest x-ray on that day showed interval resolution of pleural fluid and airspace disease, cardiomegaly without congestive failure.  I saw her again on 10/25/2019 for feet and ankle edema.  She had been taking Lasix as needed but then it was increased to 40 mg daily per phone call.  She had significant improvement with only mild edema at the office visit.  She continued to have mild DOE, mostly first thing in the morning. I uptitrated her Entresto to try to optimize diuretic effects.  Follow-up labs on 11/08/2019 showed normal renal function with serum  creatinine of 0.86 and potassium of 4.2.   Julie Rice is here today for close follow-up with her close friend. She has been doing fairly well. She did have an episode of increased swelling 2 days ago but she took an extra Lasix with improvement.  Today her lower legs are just puffy but no pitting.  Home blood pressures have been around 140/60's.  Her heart rate went out in the 40s and was irregular according to her blood pressure machine yesterday.  Heart sounds are irregularly irregular on exam.  EKG shows sinus  rhythm with PACs in a pattern.  Rate is in the 60s.  Julie Rice was doing well last week.  She had been active, cleaning, vacuuming, shopping.  She is feeling a little bit more fatigued and short of breath over the last several days.  Hopefully this will improve with the extra dose of Lasix and removal of fluid.  Cardiac studies   TEE 08/19/2019 IMPRESSIONS  1. Left ventricular ejection fraction, by visual estimation, is 25 to 30%. The left ventricle has severely decreased function. There is no left ventricular hypertrophy. 2. Global right ventricle has normal systolic function.The right ventricular size is normal. 3. Left atrial size was severely dilated. 4. Right atrial size was moderately dilated. 5. Mild mitral annular calcification. 6. The mitral valve is normal in structure. Moderate mitral valve regurgitation. 7. The tricuspid valve is normal in structure. Tricuspid valve regurgitation moderate. 8. The aortic valve is tricuspid Aortic valve regurgitation is mild by color flow Doppler. Mild aortic valve sclerosis without stenosis. 9. The pulmonic valve was grossly normal. Pulmonic valve regurgitation is mild by color flow Doppler. 10. Mild plaque invoving the descending aorta. 11. Severe global reduction in LV systolic function; biatrial enlargement; atrial septal aneurysm; no LAA thrombus; mild AI; moderate MR and TR.   Echocardiogram 08/17/2019 IMPRESSIONS 1. Left ventricular ejection fraction, by visual estimation, is 45 to 50%. Assessment of LV function complicated by atrial fibrillation, but overall appears mildly decreased global systolic function. Normal left ventricular size. There is mildly  increased left ventricular hypertrophy. 2. Left ventricular diastolic Doppler parameters are indeterminate pattern of LV diastolic filling. 3. Global right ventricle has normal systolic function.The right ventricular size is normal. No increase in right ventricular wall  thickness. 4. Left atrial size was severely dilated. 5. Right atrial size was moderately dilated. 6. Moderate pleural effusion in the left lateral region. 7. Trivial pericardial effusion is present. 8. The mitral valve is abnormal. Mild to moderate mitral valve regurgitation. EROA 0.15, RV 23cc 9. The tricuspid valve is normal in structure. Tricuspid valve regurgitation is mild. 10. The aortic valve is tricuspid Aortic valve regurgitation is mild by color flow Doppler. 11. Mildly elevated pulmonary artery systolic pressure. RVSP 42 mmHg 12. The inferior vena cava is dilated in size with >50% respiratory variability, suggesting right atrial pressure of 8 mmHg.  RIGHT/LEFT HEART CATH AND CORONARY ANGIOGRAPHY  07/26/2019  Conclusion  Right dominant coronary anatomy  Widely patent left main  30 to 40% mid LAD  Widely patent circumflex  Luminal irregularities in a dominant right coronary.  Right heart pressures are relatively low. Pulmonary wedge pressure mean is 2 mmHg.  LVEDP 4 mmHg. Left ventriculography by hand-injection was not helpful. From images obtained, LV function appears relatively normal.  RECOMMENDATIONS:  Per treating team.  Relatively volume contracted and therefore we will give normal saline 100 cc/h for the next 4 hours.     Past Medical History:  Diagnosis Date  . Anemia   . Anxiety   . Bipolar disorder ([place])   . Blood transfusion 1991   autologous pts own blood given   . CAD (coronary artery disease)   . Cataract   . Chest pain    "@ rest, lying down, w/exertion"  . Chronic back pain    "mostly lower back but I do have upper back pain regularly" (04/06/2018)  . COPD (chronic obstructive pulmonary disease) ([place])   . Depression   . Esophageal stricture   . Fibromyalgia   . GERD (gastroesophageal reflux disease)   . Heart murmur    "slight" (04/06/2018)  . Hiatal hernia   . Hyperlipidemia    Patient denies  . Hypertension   . Internal  hemorrhoids   . Lumbago   . Mitral regurgitation   . Osteoarthritis   . Osteoporosis   . Pneumonia ~ 01/2018  . PONV (postoperative nausea and vomiting)    severe ponv, "in the past" (04/06/2018)  . Rectal bleeding   . Rheumatoid arthritis ([place])   . Spondylosis   . TIA (transient ischemic attack) 2013  . Urinary incontinence    wears depends    Past Surgical History:  Procedure Laterality Date  . ABDOMINAL HYSTERECTOMY  1982  . BACK SURGERY    . BALLOON DILATION N/A 09/21/2018   Procedure: BALLOON DILATION;  Surgeon: Irene Shipper, MD;  Location: [facility] ENDOSCOPY;  Service: Endoscopy;  Laterality: N/A;  . BALLOON DILATION N/A 08/12/2019   Procedure: BALLOON DILATION;  Surgeon: Jackquline Denmark, MD;  Location: [facility] ENDOSCOPY;  Service: Endoscopy;  Laterality: N/A;  . BIOPSY  08/12/2019   Procedure: BIOPSY;  Surgeon: Jackquline Denmark, MD;  Location: [facility] ENDOSCOPY;  Service: Endoscopy;;  . BLADDER SUSPENSION  1980's  . BOTOX INJECTION N/A 09/21/2018   Procedure: BOTOX INJECTION;  Surgeon: Irene Shipper, MD;  Location: [facility] ENDOSCOPY;  Service: Endoscopy;  Laterality: N/A;  . BOTOX INJECTION N/A 08/12/2019   Procedure: BOTOX INJECTION;  Surgeon: Jackquline Denmark, MD;  Location: [facility] ENDOSCOPY;  Service: Endoscopy;  Laterality: N/A;  . CATARACT EXTRACTION W/ INTRAOCULAR LENS  IMPLANT, BILATERAL Bilateral 2010  . DILATION AND CURETTAGE OF UTERUS  1961  . ESOPHAGEAL MANOMETRY N/A 03/25/2018   Procedure: ESOPHAGEAL MANOMETRY (EM);  Surgeon: Mauri Pole, MD;  Location: [facility] ENDOSCOPY;  Service: Endoscopy;  Laterality: N/A;  . ESOPHAGOGASTRODUODENOSCOPY (EGD) WITH PROPOFOL N/A 04/07/2018   Procedure: ESOPHAGOGASTRODUODENOSCOPY (EGD) WITH PROPOFOL;  Surgeon: Doran Stabler, MD;  Location: [place];  Service: Gastroenterology;  Laterality: N/A;  . ESOPHAGOGASTRODUODENOSCOPY (EGD) WITH PROPOFOL N/A 09/21/2018   Procedure: ESOPHAGOGASTRODUODENOSCOPY (EGD) WITH PROPOFOL;  Surgeon: Irene Shipper, MD;   Location: [facility] ENDOSCOPY;  Service: Endoscopy;  Laterality: N/A;  . ESOPHAGOGASTRODUODENOSCOPY (EGD) WITH PROPOFOL N/A 08/12/2019   Procedure: ESOPHAGOGASTRODUODENOSCOPY (EGD) WITH PROPOFOL;  Surgeon: Jackquline Denmark, MD;  Location: [facility] ENDOSCOPY;  Service: Endoscopy;  Laterality: N/A;  . HERNIA REPAIR    . HIATAL HERNIA REPAIR N/A 08/02/2016   Procedure: LAPAROSCOPIC REPAIR OF LARGE  HIATAL HERNIA;  Surgeon: Excell Seltzer, MD;  Location: [facility] ORS;  Service: General;  Laterality: N/A;  . JOINT REPLACEMENT    . LAPAROSCOPIC NISSEN FUNDOPLICATION N/A 01/23/253   Procedure: LAPAROSCOPIC NISSEN FUNDOPLICATION;  Surgeon: Excell Seltzer, MD;  Location: [facility] ORS;  Service: General;  Laterality: N/A;  . [place]; 1994; [id number]   "I've got 2 stainless steel rods; 6 screws; 2 ray cages"took bone from right hip to put in  back  . RIGHT/LEFT HEART CATH AND CORONARY ANGIOGRAPHY N/A 07/26/2019   Procedure: RIGHT/LEFT HEART CATH AND CORONARY ANGIOGRAPHY;  Surgeon: Lyn Records, MD;  Location: [facility] INVASIVE CV LAB;  Service: Cardiovascular;  Laterality: N/A;  . TEE WITHOUT CARDIOVERSION N/A 08/19/2019   Procedure: TRANSESOPHAGEAL ECHOCARDIOGRAM (TEE);  Surgeon: Lewayne Bunting, MD;  Location: [facility] ENDOSCOPY;  Service: Cardiovascular;  Laterality: N/A;  . TONSILLECTOMY AND ADENOIDECTOMY  1945  . TOTAL KNEE ARTHROPLASTY Right ~ 1996  . TUBAL LIGATION  ~ 1976    Current Medications: Current Meds  Medication Sig  . albuterol (VENTOLIN HFA) 108 (90 Base) MCG/ACT inhaler Inhale 2 puffs into the lungs every 6 (six) hours as needed for wheezing or shortness of breath.  Marland Kitchen amiodarone (PACERONE) 200 MG tablet Take 1 tablet (200 mg total) by mouth daily.  Marland Kitchen amLODipine (NORVASC) 2.5 MG tablet Take 1 tablet (2.5 mg total) by mouth daily.  Marland Kitchen apixaban (ELIQUIS) 5 MG TABS tablet Take 1 tablet (5 mg total) by mouth 2 (two) times daily.  . carvedilol (COREG) 6.25 MG tablet Take 1 tablet (6.25 mg total) by mouth  2 (two) times daily with a meal.  . clonazePAM (KLONOPIN) 0.5 MG tablet TAKE 1 TABLET BY MOUTH 2 TIMES DAILY AS NEEDED FOR ANXIETY.  . famotidine (PEPCID) 20 MG tablet One after supper  . furosemide (LASIX) 40 MG tablet Take 1 tablet (40 mg total) by mouth daily. Take 1 tablet by mouth once a day. You may take an extra tablet as needed at 2 PM for increased swelling  . gabapentin (NEURONTIN) 100 MG capsule TAKE 1 CAPSULE BY MOUTH THREE TIMES A DAY  . omeprazole (PRILOSEC) 20 MG capsule Take 1 capsule (20 mg total) by mouth daily.  . sacubitril-valsartan (ENTRESTO) 97-103 MG Take 1 tablet by mouth 2 (two) times daily.  Marland Kitchen spironolactone (ALDACTONE) 25 MG tablet Take 1 tablet (25 mg total) by mouth daily.     Allergies:   Morphine and Tape   Social History   Socioeconomic History  . Marital status: Widowed    Spouse name: Not on file  . Number of children: 2  . Years of education: Not on file  . Highest education level: Not on file  Occupational History    Employer: RETIRED  Tobacco Use  . Smoking status: Never Smoker  . Smokeless tobacco: Never Used  Substance and Sexual Activity  . Alcohol use: Never  . Drug use: Never  . Sexual activity: Not Currently  Other Topics Concern  . Not on file  Social History Narrative   Retired    Widowed   Social Determinants of Corporate investment banker Strain:   . Difficulty of Paying Living Expenses: Not on file  Food Insecurity:   . Worried About Programme researcher, broadcasting/film/video in the Last Year: Not on file  . Ran Out of Food in the Last Year: Not on file  Transportation Needs:   . Lack of Transportation (Medical): Not on file  . Lack of Transportation (Non-Medical): Not on file  Physical Activity:   . Days of Exercise per Week: Not on file  . Minutes of Exercise per Session: Not on file  Stress:   . Feeling of Stress : Not on file  Social Connections:   . Frequency of Communication with Friends and Family: Not on file  . Frequency of Social  Gatherings with Friends and Family: Not on file  . Attends Religious Services: Not on file  .  Active Member of Clubs or Organizations: Not on file  . Attends Banker Meetings: Not on file  . Marital Status: Not on file     Family History: The patient's family history includes Heart attack in her brother; Heart disease in her brother; Heart disease (age of onset: 30) in her father; Hypertension in her mother; Kidney disease in her mother; Suicidality in her son. There is no history of Colon cancer, Esophageal cancer, Pancreatic cancer, Rectal cancer, or Stomach cancer. ROS:   Please see the history of present illness.     All other systems reviewed and are negative.   EKG:  EKG is ordered today.  The ekg ordered today demonstrates sinus rhythm, LAD, LBBB, 63 bpm, machine read QTC is 519 however by my measurement QTC is more like 440.  Recent Labs: 08/17/2019: B Natriuretic Peptide 599.2 08/23/2019: Magnesium 1.8 09/07/2019: ALT 10; Hemoglobin 11.0; Platelets 218.0; Pro B Natriuretic peptide (BNP) 1,473.0; TSH 3.58 11/08/2019: BUN 11; Creatinine, Ser 0.86; Potassium 4.2; Sodium 142   Recent Lipid Panel    Component Value Date/Time   CHOL 127 08/19/2019 0429   TRIG 103 08/19/2019 0429   HDL 42 08/19/2019 0429   CHOLHDL 3.0 08/19/2019 0429   VLDL 21 08/19/2019 0429   LDLCALC 64 08/19/2019 0429   LDLDIRECT 122.2 06/01/2013 0944    Physical Exam:    VS:  BP (!) 150/60   Pulse (!) 40   Ht 5' 1.5" (1.562 m)   Wt 142 lb (64.4 kg)   SpO2 98%   BMI 26.40 kg/m     Wt Readings from Last 6 Encounters:  11/11/19 142 lb (64.4 kg)  10/25/19 141 lb 6.4 oz (64.1 kg)  10/19/19 140 lb 9.6 oz (63.8 kg)  09/24/19 135 lb (61.2 kg)  09/22/19 135 lb (61.2 kg)  09/08/19 137 lb (62.1 kg)     Physical Exam  Constitutional: She is oriented to person, place, and time. She appears well-developed and well-nourished. No distress.  HENT:  Head: Normocephalic and atraumatic.  Neck: No  JVD present.  Cardiovascular: Normal rate, regular rhythm, normal heart sounds and intact distal pulses. Exam reveals no gallop and no friction rub.  No murmur heard. Pulmonary/Chest: Effort normal and breath sounds normal. No respiratory distress. She has no wheezes. She has no rales.  Abdominal: Soft. Bowel sounds are normal.  Musculoskeletal:        General: Edema present. Normal range of motion.     Cervical back: Normal range of motion and neck supple.     Comments: Trace puffiness of ankles  Neurological: She is alert and oriented to person, place, and time.  Skin: Skin is warm and dry.  Psychiatric: She has a normal mood and affect. Her behavior is normal. Judgment and thought content normal.  Vitals reviewed.   ASSESSMENT:    1. Chronic systolic congestive heart failure (HCC)   2. Paroxysmal atrial fibrillation (HCC)   3. Coronary artery disease involving native coronary artery of native heart without angina pectoris    PLAN:    In order of problems listed above:  Chronic systolic heart failure: -LVEF 25 to 30% by echo on 08/19/2019. TTE just 3 days priorshowed EF 45-50%.   -Now on coreg, Entresto, spiro.  -Now on Lasix 40 mg daily for lower extremity edema, extra dose in the afternoon as needed for swelling.  She did require a dose 2 days ago with improvement. Sherryll Burger was uptitrated to 97/103 mg twice daily.  Labs stable. -Chest x-ray on 10/19/2019 was clear. -She will continue to take Lasix 40 mg daily with an occasional extra 40 mg in the afternoon for increased swelling. -We will plan for follow-up echocardiogram in 2 months since medication is optimized. -Follow-up in the office in 3 months unless needed sooner.  Atrial fibrillation: -Recent hospitalization with finding of new onset atrial fibrillation with RVR.  The patient was very symptomatic so rhythm control strategy was adopted and she has been placed on amiodarone, which has been reduced to 200 mg daily  maintenance dose. -The patient continues in sinus rhythm, with frequent PACs.  -CHADSVASC score is 7.  Patient continues on Eliquis. No unusual bleeding.   Nonobstructive CAD: -by cardiac catheterization on September 21. -No angina  NSVT: -Patient had some episodes of NSVT in the hospital on telemetry.  On beta-blocker. No perceived recurrences.    Medication Adjustments/Labs and Tests Ordered: Current medicines are reviewed at length with the patient today.  Concerns regarding medicines are outlined above. Labs and tests ordered and medication changes are outlined in the patient instructions below:  Patient Instructions  Medication Instructions:  Your physician recommends that you continue on your current medications as directed. Please refer to the Current Medication list given to you today.   *If you need a refill on your cardiac medications before your next appointment, please call your pharmacy*  Lab Work: Your physician has requested that you have an echocardiogram in 2 months. Echocardiography is a painless test that uses sound waves to create images of your heart. It provides your doctor with information about the size and shape of your heart and how well your heart's chambers and valves are working. This procedure takes approximately one hour. There are no restrictions for this procedure.    If you have labs (blood work) drawn today and your tests are completely normal, you will receive your results only by: Marland Kitchen MyChart Message (if you have MyChart) OR . A paper copy in the mail If you have any lab test that is abnormal or we need to change your treatment, we will call you to review the results.  Testing/Procedures: None ordered   Follow-Up: At [facility], you and your health needs are our priority.  As part of our continuing mission to provide you with exceptional heart care, we have created designated Provider Care Teams.  These Care Teams include your primary  Cardiologist (physician) and Advanced Practice Providers (APPs -  Physician Assistants and Nurse Practitioners) who all work together to provide you with the care you need, when you need it.  Your next appointment:   3 month(s)  The format for your next appointment:   In Person  Provider:   You may see Charlton Haws, MD or one of the following Advanced Practice Providers on your designated Care Team:    Norma Fredrickson, NP  Nada Boozer, NP  Georgie Chard, NP     Other Instructions None      Signed, Berton Bon, NP  11/11/2019 5:30 PM     [facility]

## 2019-11-11 NOTE — Patient Instructions (Signed)
Medication Instructions:  Your physician recommends that you continue on your current medications as directed. Please refer to the Current Medication list given to you today.   *If you need a refill on your cardiac medications before your next appointment, please call your pharmacy*  Lab Work: Your physician has requested that you have an echocardiogram in 2 months. Echocardiography is a painless test that uses sound waves to create images of your heart. It provides your doctor with information about the size and shape of your heart and how well your heart's chambers and valves are working. This procedure takes approximately one hour. There are no restrictions for this procedure.    If you have labs (blood work) drawn today and your tests are completely normal, you will receive your results only by: Marland Kitchen MyChart Message (if you have MyChart) OR . A paper copy in the mail If you have any lab test that is abnormal or we need to change your treatment, we will call you to review the results.  Testing/Procedures: None ordered   Follow-Up: At [facility], you and your health needs are our priority.  As part of our continuing mission to provide you with exceptional heart care, we have created designated Provider Care Teams.  These Care Teams include your primary Cardiologist (physician) and Advanced Practice Providers (APPs -  Physician Assistants and Nurse Practitioners) who all work together to provide you with the care you need, when you need it.  Your next appointment:   3 month(s)  The format for your next appointment:   In Person  Provider:   You may see Charlton Haws, MD or one of the following Advanced Practice Providers on your designated Care Team:    Norma Fredrickson, NP  Nada Boozer, NP  Georgie Chard, NP     Other Instructions None

## 2019-11-13 ENCOUNTER — Other Ambulatory Visit: Payer: Self-pay | Admitting: Adult Health

## 2019-11-16 ENCOUNTER — Encounter: Payer: Self-pay | Admitting: Adult Health

## 2019-11-26 DIAGNOSIS — J9611 Chronic respiratory failure with hypoxia: Secondary | ICD-10-CM | POA: Diagnosis not present

## 2019-11-26 DIAGNOSIS — J449 Chronic obstructive pulmonary disease, unspecified: Secondary | ICD-10-CM | POA: Diagnosis not present

## 2019-11-28 ENCOUNTER — Other Ambulatory Visit: Payer: Self-pay | Admitting: Adult Health

## 2019-11-30 NOTE — Telephone Encounter (Signed)
I am not sure if this is the pharmacy requesting the medication but this was discontinued in the hospital and she should not be taking it any longer

## 2019-12-02 ENCOUNTER — Telehealth: Payer: Self-pay

## 2019-12-02 NOTE — Telephone Encounter (Signed)
I spoke to the patient who continues to have SOB, lightheadedness and leg pain (knee to foot).  Her HR continues to fluctuate at times as noted in My Chart message.    She is feeling better today with a HR 63 and BP 120/61 this morning.  She had OV on 1/7 and it was suggested that she has an Echo in 2 months with 3 month f/u.

## 2019-12-02 NOTE — Telephone Encounter (Signed)
lpmtcb 1/28 

## 2019-12-02 NOTE — Telephone Encounter (Signed)
I think she should be seen back in the office. Since she has only seen me, I would like for her to see her primary cardiologist, Dr. Eden Emms if possible for another perspective.

## 2019-12-03 NOTE — Telephone Encounter (Signed)
Made patient next available appointment with Dr. Eden Emms.

## 2019-12-14 ENCOUNTER — Telehealth: Payer: Self-pay | Admitting: *Deleted

## 2019-12-14 NOTE — Telephone Encounter (Signed)
Tried again to reach patient. Mailbox full

## 2019-12-14 NOTE — Telephone Encounter (Signed)
Hello Dr Jeraldine Loots, I had some pains earlier in the left side of my head.  My BP was 108 over 54 Pulse 69. I began feeling light headed about 30 mins ago & now my BP is 91 over 57 my pulse 67.  I had all my AM Meds at 8 am.   Should I call the In Home nurse that was here on Sat? I have been signed up with them the past 3 weeks. I think it is [employer].  Thank you Geraldine Contras Above message copied from my chart message I placed call to patient but home number would not accept call.  Voicemail on cell phone is full.  I replied to my chart message that I would call to discuss symptoms.

## 2019-12-14 NOTE — Telephone Encounter (Signed)
I spoke with the patient about her hypotension 91/57 and pain in her head.  She said her BP has been running low and she has been dizzy.  She had a headache, but that is gone now.  I told her to hold her evening dose of Carvedilol and to stay well hydrated.   Her BP has come up to 117/60 and she will continue to monitor.  I told her to check her BP in the morning prior to medications, take medications and record BP 2 hours after that.  She will call in morning with updates on BP and symptoms.

## 2019-12-18 ENCOUNTER — Other Ambulatory Visit: Payer: Self-pay | Admitting: Adult Health

## 2019-12-18 DIAGNOSIS — Z760 Encounter for issue of repeat prescription: Secondary | ICD-10-CM

## 2019-12-21 NOTE — Telephone Encounter (Signed)
Okay for refill?  

## 2019-12-27 DIAGNOSIS — J449 Chronic obstructive pulmonary disease, unspecified: Secondary | ICD-10-CM | POA: Diagnosis not present

## 2019-12-27 DIAGNOSIS — J9611 Chronic respiratory failure with hypoxia: Secondary | ICD-10-CM | POA: Diagnosis not present

## 2019-12-27 NOTE — Progress Notes (Signed)
Cardiology Office Note:    Date:  12/29/2019   ID:  Julie Rice, DOB Mar 18, 1936, MRN [id number]  PCP:  Julie Frees, NP  Cardiologist:  Julie Haws, MD  Referring MD: Julie Frees, NP   No chief complaint on file.   History of Present Illness:    Julie Rice is a 84 y.o. female with a past medical history significant for hypertension, hyperlipidemia, systolic CHF, fibromyalgia, anemia, esophageal stricture status post balloon dilatation, large symptomatic hiatal hernia status post fundoplication, chronic dysphagia, asthma/COPD on oxygen at 2 L at home, bipolar disorder, rheumatoid arthritis, TIA and chronic urinary incontinence.  Right and left heart cath on 07/26/2019 showed only mild nonobstructive disease and low right heart pressures.08/16/19 had PAF EF 40-45% by TTE with convertion using amiodarone Sees pulmonary with ambulatory desats on oxygen Having some headaches and low BP evening coreg held at times   Continues to have significant dyspnea Seems to breath heavy in room and sigh a lot Breathing should not be that bad from heart and she is in NSR today     Cardiac studies   TEE 08/19/2019 IMPRESSIONS  1. Left ventricular ejection fraction, by visual estimation, is 25 to 30%. The left ventricle has severely decreased function. There is no left ventricular hypertrophy. 2. Global right ventricle has normal systolic function.The right ventricular size is normal. 3. Left atrial size was severely dilated. 4. Right atrial size was moderately dilated. 5. Mild mitral annular calcification. 6. The mitral valve is normal in structure. Moderate mitral valve regurgitation. 7. The tricuspid valve is normal in structure. Tricuspid valve regurgitation moderate. 8. The aortic valve is tricuspid Aortic valve regurgitation is mild by color flow Doppler. Mild aortic valve sclerosis without stenosis. 9. The pulmonic valve was grossly normal. Pulmonic valve regurgitation  is mild by color flow Doppler. 10. Mild plaque invoving the descending aorta. 11. Severe global reduction in LV systolic function; biatrial enlargement; atrial septal aneurysm; no LAA thrombus; mild AI; moderate MR and TR.   Echocardiogram 08/17/2019 IMPRESSIONS 1. Left ventricular ejection fraction, by visual estimation, is 45 to 50%. Assessment of LV function complicated by atrial fibrillation, but overall appears mildly decreased global systolic function. Normal left ventricular size. There is mildly  increased left ventricular hypertrophy. 2. Left ventricular diastolic Doppler parameters are indeterminate pattern of LV diastolic filling. 3. Global right ventricle has normal systolic function.The right ventricular size is normal. No increase in right ventricular wall thickness. 4. Left atrial size was severely dilated. 5. Right atrial size was moderately dilated. 6. Moderate pleural effusion in the left lateral region. 7. Trivial pericardial effusion is present. 8. The mitral valve is abnormal. Mild to moderate mitral valve regurgitation. EROA 0.15, RV 23cc 9. The tricuspid valve is normal in structure. Tricuspid valve regurgitation is mild. 10. The aortic valve is tricuspid Aortic valve regurgitation is mild by color flow Doppler. 11. Mildly elevated pulmonary artery systolic pressure. RVSP 42 mmHg 12. The inferior vena cava is dilated in size with >50% respiratory variability, suggesting right atrial pressure of 8 mmHg.  RIGHT/LEFT HEART CATH AND CORONARY ANGIOGRAPHY  07/26/2019  Conclusion  Right dominant coronary anatomy  Widely patent left main  30 to 40% mid LAD  Widely patent circumflex  Luminal irregularities in a dominant right coronary.  Right heart pressures are relatively low. Pulmonary wedge pressure mean is 2 mmHg.  LVEDP 4 mmHg. Left ventriculography by hand-injection was not helpful. From images obtained, LV function appears relatively  normal.  RECOMMENDATIONS:  Per treating team.  Relatively volume contracted and therefore we will give normal saline 100 cc/h for the next 4 hours.     Past Medical History:  Diagnosis Date  . Anemia   . Anxiety   . Bipolar disorder (HCC)   . Blood transfusion 1991   autologous pts own blood given   . CAD (coronary artery disease)   . Cataract   . Chest pain    "@ rest, lying down, w/exertion"  . Chronic back pain    "mostly lower back but I do have upper back pain regularly" (04/06/2018)  . COPD (chronic obstructive pulmonary disease) (HCC)   . Depression   . Esophageal stricture   . Fibromyalgia   . GERD (gastroesophageal reflux disease)   . Heart murmur    "slight" (04/06/2018)  . Hiatal hernia   . Hyperlipidemia    Patient denies  . Hypertension   . Internal hemorrhoids   . Lumbago   . Mitral regurgitation   . Osteoarthritis   . Osteoporosis   . Pneumonia ~ 01/2018  . PONV (postoperative nausea and vomiting)    severe ponv, "in the past" (04/06/2018)  . Rectal bleeding   . Rheumatoid arthritis (HCC)   . Spondylosis   . TIA (transient ischemic attack) 2013  . Urinary incontinence    wears depends    Past Surgical History:  Procedure Laterality Date  . ABDOMINAL HYSTERECTOMY  1982  . BACK SURGERY    . BALLOON DILATION N/A 09/21/2018   Procedure: BALLOON DILATION;  Surgeon: Hilarie Fredrickson, MD;  Location: [facility] ENDOSCOPY;  Service: Endoscopy;  Laterality: N/A;  . BALLOON DILATION N/A 08/12/2019   Procedure: BALLOON DILATION;  Surgeon: Lynann Bologna, MD;  Location: [facility] ENDOSCOPY;  Service: Endoscopy;  Laterality: N/A;  . BIOPSY  08/12/2019   Procedure: BIOPSY;  Surgeon: Lynann Bologna, MD;  Location: [facility] ENDOSCOPY;  Service: Endoscopy;;  . BLADDER SUSPENSION  1980's  . BOTOX INJECTION N/A 09/21/2018   Procedure: BOTOX INJECTION;  Surgeon: Hilarie Fredrickson, MD;  Location: [facility] ENDOSCOPY;  Service: Endoscopy;  Laterality: N/A;  . BOTOX INJECTION N/A 08/12/2019   Procedure:  BOTOX INJECTION;  Surgeon: Lynann Bologna, MD;  Location: [facility] ENDOSCOPY;  Service: Endoscopy;  Laterality: N/A;  . CATARACT EXTRACTION W/ INTRAOCULAR LENS  IMPLANT, BILATERAL Bilateral 2010  . DILATION AND CURETTAGE OF UTERUS  1961  . ESOPHAGEAL MANOMETRY N/A 03/25/2018   Procedure: ESOPHAGEAL MANOMETRY (EM);  Surgeon: Napoleon Form, MD;  Location: [facility] ENDOSCOPY;  Service: Endoscopy;  Laterality: N/A;  . ESOPHAGOGASTRODUODENOSCOPY (EGD) WITH PROPOFOL N/A 04/07/2018   Procedure: ESOPHAGOGASTRODUODENOSCOPY (EGD) WITH PROPOFOL;  Surgeon: Sherrilyn Rist, MD;  Location: [facility] ENDOSCOPY;  Service: Gastroenterology;  Laterality: N/A;  . ESOPHAGOGASTRODUODENOSCOPY (EGD) WITH PROPOFOL N/A 09/21/2018   Procedure: ESOPHAGOGASTRODUODENOSCOPY (EGD) WITH PROPOFOL;  Surgeon: Hilarie Fredrickson, MD;  Location: [facility] ENDOSCOPY;  Service: Endoscopy;  Laterality: N/A;  . ESOPHAGOGASTRODUODENOSCOPY (EGD) WITH PROPOFOL N/A 08/12/2019   Procedure: ESOPHAGOGASTRODUODENOSCOPY (EGD) WITH PROPOFOL;  Surgeon: Lynann Bologna, MD;  Location: [facility] ENDOSCOPY;  Service: Endoscopy;  Laterality: N/A;  . HERNIA REPAIR    . HIATAL HERNIA REPAIR N/A 08/02/2016   Procedure: LAPAROSCOPIC REPAIR OF LARGE  HIATAL HERNIA;  Surgeon: Glenna Fellows, MD;  Location: [facility] ORS;  Service: General;  Laterality: N/A;  . JOINT REPLACEMENT    . LAPAROSCOPIC NISSEN FUNDOPLICATION N/A 08/02/2016   Procedure: LAPAROSCOPIC NISSEN FUNDOPLICATION;  Surgeon: Glenna Fellows, MD;  Location: [facility] ORS;  Service: General;  Laterality: N/A;  .  LUMBAR LAMINECTOMY  1990; 1994; [id number]   "I've got 2 stainless steel rods; 6 screws; 2 ray cages"took bone from right hip to put in back  . RIGHT/LEFT HEART CATH AND CORONARY ANGIOGRAPHY N/A 07/26/2019   Procedure: RIGHT/LEFT HEART CATH AND CORONARY ANGIOGRAPHY;  Surgeon: Lyn Records, MD;  Location: [facility] INVASIVE CV LAB;  Service: Cardiovascular;  Laterality: N/A;  . TEE WITHOUT CARDIOVERSION N/A 08/19/2019   Procedure:  TRANSESOPHAGEAL ECHOCARDIOGRAM (TEE);  Surgeon: Lewayne Bunting, MD;  Location: [facility] ENDOSCOPY;  Service: Cardiovascular;  Laterality: N/A;  . TONSILLECTOMY AND ADENOIDECTOMY  1945  . TOTAL KNEE ARTHROPLASTY Right ~ 1996  . TUBAL LIGATION  ~ 1976    Current Medications: Current Meds  Medication Sig  . albuterol (VENTOLIN HFA) 108 (90 Base) MCG/ACT inhaler Inhale 2 puffs into the lungs every 6 (six) hours as needed for wheezing or shortness of breath.  Marland Kitchen amiodarone (PACERONE) 200 MG tablet Take 1 tablet (200 mg total) by mouth daily.  Marland Kitchen amLODipine (NORVASC) 2.5 MG tablet Take 1 tablet (2.5 mg total) by mouth daily.  Marland Kitchen apixaban (ELIQUIS) 5 MG TABS tablet Take 1 tablet (5 mg total) by mouth 2 (two) times daily.  . carvedilol (COREG) 6.25 MG tablet Take 1 tablet (6.25 mg total) by mouth 2 (two) times daily with a meal.  . clonazePAM (KLONOPIN) 0.5 MG tablet TAKE 1 TABLET BY MOUTH TWICE A DAY AS NEEDED FOR ANXIETY  . famotidine (PEPCID) 20 MG tablet One after supper  . furosemide (LASIX) 40 MG tablet Take 1 tablet (40 mg total) by mouth daily. Take 1 tablet by mouth once a day. You may take an extra tablet as needed at 2 PM for increased swelling  . gabapentin (NEURONTIN) 100 MG capsule TAKE 1 CAPSULE BY MOUTH THREE TIMES A DAY  . omeprazole (PRILOSEC) 20 MG capsule Take 1 capsule (20 mg total) by mouth daily.  . sacubitril-valsartan (ENTRESTO) 97-103 MG Take 1 tablet by mouth 2 (two) times daily.  Marland Kitchen spironolactone (ALDACTONE) 25 MG tablet Take 1 tablet (25 mg total) by mouth daily.     Allergies:   Morphine and Tape   Social History   Socioeconomic History  . Marital status: Widowed    Spouse name: Not on file  . Number of children: 2  . Years of education: Not on file  . Highest education level: Not on file  Occupational History    Employer: RETIRED  Tobacco Use  . Smoking status: Never Smoker  . Smokeless tobacco: Never Used  Substance and Sexual Activity  . Alcohol use: Never    . Drug use: Never  . Sexual activity: Not Currently  Other Topics Concern  . Not on file  Social History Narrative   Retired    Widowed   Social Determinants of Corporate investment banker Strain:   . Difficulty of Paying Living Expenses: Not on file  Food Insecurity:   . Worried About Programme researcher, broadcasting/film/video in the Last Year: Not on file  . Ran Out of Food in the Last Year: Not on file  Transportation Needs:   . Lack of Transportation (Medical): Not on file  . Lack of Transportation (Non-Medical): Not on file  Physical Activity:   . Days of Exercise per Week: Not on file  . Minutes of Exercise per Session: Not on file  Stress:   . Feeling of Stress : Not on file  Social Connections:   . Frequency of Communication with Friends  and Family: Not on file  . Frequency of Social Gatherings with Friends and Family: Not on file  . Attends Religious Services: Not on file  . Active Member of Clubs or Organizations: Not on file  . Attends Archivist Meetings: Not on file  . Marital Status: Not on file     Family History: The patient's family history includes Heart attack in her brother; Heart disease in her brother; Heart disease (age of onset: 25) in her father; Hypertension in her mother; Kidney disease in her mother; Suicidality in her son. There is no history of Colon cancer, Esophageal cancer, Pancreatic cancer, Rectal cancer, or Stomach cancer. ROS:   Please see the history of present illness.     All other systems reviewed and are negative.   EKG:   11/11/19 sinus rhythm, LAD, LBBB, 63 bpm, machine read QTC is 519 however by my measurement QTC is more like 440.  Recent Labs: 08/17/2019: B Natriuretic Peptide 599.2 08/23/2019: Magnesium 1.8 09/07/2019: ALT 10; Hemoglobin 11.0; Platelets 218.0; Pro B Natriuretic peptide (BNP) 1,473.0; TSH 3.58 11/08/2019: BUN 11; Creatinine, Ser 0.86; Potassium 4.2; Sodium 142   Recent Lipid Panel    Component Value Date/Time   CHOL 127  08/19/2019 0429   TRIG 103 08/19/2019 0429   HDL 42 08/19/2019 0429   CHOLHDL 3.0 08/19/2019 0429   VLDL 21 08/19/2019 0429   LDLCALC 64 08/19/2019 0429   LDLDIRECT 122.2 06/01/2013 0944    Physical Exam:    VS:  BP (!) 120/52   Pulse 68   Ht 5' 1.5" (1.562 m)   Wt 137 lb (62.1 kg)   SpO2 99%   BMI 25.47 kg/m     Wt Readings from Last 6 Encounters:  12/29/19 137 lb (62.1 kg)  11/11/19 142 lb (64.4 kg)  10/25/19 141 lb 6.4 oz (64.1 kg)  10/19/19 140 lb 9.6 oz (63.8 kg)  09/24/19 135 lb (61.2 kg)  09/22/19 135 lb (61.2 kg)     Affect appropriate Chronically ill female  HEENT: normal Neck supple with no adenopathy JVP normal no bruits no thyromegaly Lungs clear with no wheezing and good diaphragmatic motion Heart:  S1/S2 no murmur, no rub, gallop or click PMI normal Abdomen: benighn, BS positve, no tenderness, no AAA no bruit.  No HSM or HJR Distal pulses intact with no bruits No edema Neuro non-focal Skin warm and dry No muscular weakness   ASSESSMENT:    1. Cardiomyopathy, unspecified type ([place])   2. Long term current use of amiodarone    PLAN:    In order of problems listed above:  Chronic systolic heart failure: -LVEF 25 to 30% by echo on 08/19/2019. TTE just 3 days priorshowed EF 45-50%.   -On GDMT will order cardiac MRI to accurately assess EF Check BMET and BNP   Atrial fibrillation: - converted with amiodarone needs f/u TSH/LFTls and PFTls with DLCO -CHADSVASC score is 7.  Patient continues on Eliquis. No unusual bleeding.   Nonobstructive CAD: -by cardiac catheterization on September 21. -No angina  NSVT: -Patient had some episodes of NSVT in the hospital on telemetry.  On beta-blocker. No perceived recurrences.    Medication Adjustments/Labs and Tests Ordered: Current medicines are reviewed at length with the patient today.  Concerns regarding medicines are outlined above. Labs and tests ordered and medication changes are outlined  in the patient instructions below:  Patient Instructions  Medication Instructions:   *If you need a refill on your cardiac medications  before your next appointment, please call your pharmacy*  Lab Work: Your physician recommends that you have lab work today. CMET, BNP and TSH  If you have labs (blood work) drawn today and your tests are completely normal, you will receive your results only by: Marland Kitchen MyChart Message (if you have MyChart) OR . A paper copy in the mail If you have any lab test that is abnormal or we need to change your treatment, we will call you to review the results.  Testing/Procedures: Your physician has recommended that you have a pulmonary function test. Pulmonary Function Tests are a group of tests that measure how well air moves in and out of your lungs.  Your physician has requested that you have a cardiac MRI. Cardiac MRI uses a computer to create images of your heart as its beating, producing both still and moving pictures of your heart and major blood vessels. For further information please visit [email or web address]. Please follow the instruction sheet given to you today for more information.  Follow-Up: At [facility], you and your health needs are our priority.  As part of our continuing mission to provide you with exceptional heart care, we have created designated Provider Care Teams.  These Care Teams include your primary Cardiologist (physician) and Advanced Practice Providers (APPs -  Physician Assistants and Nurse Practitioners) who all work together to provide you with the care you need, when you need it.  Your next appointment:   After test are complete  The format for your next appointment:   In Person  Provider:   You may see Julie Haws, MD or one of the following Advanced Practice Providers on your designated Care Team:    Norma Fredrickson, NP  Nada Boozer, NP  Georgie Chard, NP     Signed, Julie Haws, MD  12/29/2019 9:55 AM    [facility]

## 2019-12-29 ENCOUNTER — Other Ambulatory Visit: Payer: Self-pay

## 2019-12-29 ENCOUNTER — Encounter: Payer: Self-pay | Admitting: Cardiovascular Disease

## 2019-12-29 ENCOUNTER — Ambulatory Visit: Payer: Medicare Other | Admitting: Cardiovascular Disease

## 2019-12-29 VITALS — BP 120/52 | HR 68 | Ht 61.5 in | Wt 137.0 lb

## 2019-12-29 DIAGNOSIS — Z79899 Other long term (current) drug therapy: Secondary | ICD-10-CM | POA: Diagnosis not present

## 2019-12-29 DIAGNOSIS — I429 Cardiomyopathy, unspecified: Secondary | ICD-10-CM

## 2019-12-29 NOTE — Patient Instructions (Addendum)
Medication Instructions:   *If you need a refill on your cardiac medications before your next appointment, please call your pharmacy*  Lab Work: Your physician recommends that you have lab work today. CMET, BNP and TSH  If you have labs (blood work) drawn today and your tests are completely normal, you will receive your results only by: Marland Kitchen MyChart Message (if you have MyChart) OR . A paper copy in the mail If you have any lab test that is abnormal or we need to change your treatment, we will call you to review the results.  Testing/Procedures: Your physician has recommended that you have a pulmonary function test. Pulmonary Function Tests are a group of tests that measure how well air moves in and out of your lungs.  Your physician has requested that you have a cardiac MRI. Cardiac MRI uses a computer to create images of your heart as its beating, producing both still and moving pictures of your heart and major blood vessels. For further information please visit [email or web address]. Please follow the instruction sheet given to you today for more information.  Follow-Up: At [facility], you and your health needs are our priority.  As part of our continuing mission to provide you with exceptional heart care, we have created designated Provider Care Teams.  These Care Teams include your primary Cardiologist (physician) and Advanced Practice Providers (APPs -  Physician Assistants and Nurse Practitioners) who all work together to provide you with the care you need, when you need it.  Your next appointment:   After test are complete  The format for your next appointment:   In Person  Provider:   You may see Charlton Haws, MD or one of the following Advanced Practice Providers on your designated Care Team:    Norma Fredrickson, NP  Nada Boozer, NP  Georgie Chard, NP

## 2019-12-30 LAB — COMPREHENSIVE METABOLIC PANEL
ALT: 8 IU/L (ref 0–32)
AST: 18 IU/L (ref 0–40)
Albumin/Globulin Ratio: 1.4 (ref 1.2–2.2)
Albumin: 3.9 g/dL (ref 3.6–4.6)
Alkaline Phosphatase: 75 IU/L (ref 39–117)
BUN/Creatinine Ratio: 21 (ref 12–28)
BUN: 21 mg/dL (ref 8–27)
Bilirubin Total: 0.3 mg/dL (ref 0.0–1.2)
CO2: 21 mmol/L (ref 20–29)
Calcium: 8.9 mg/dL (ref 8.7–10.3)
Chloride: 100 mmol/L (ref 96–106)
Creatinine, Ser: 1.02 mg/dL — ABNORMAL HIGH (ref 0.57–1.00)
GFR calc Af Amer: 60 mL/min/{1.73_m2} (ref >59)
GFR calc non Af Amer: 52 mL/min/{1.73_m2} — ABNORMAL LOW (ref >59)
Globulin, Total: 2.8 g/dL (ref 1.5–4.5)
Glucose: 90 mg/dL (ref 65–99)
Potassium: 4.9 mmol/L (ref 3.5–5.2)
Sodium: 138 mmol/L (ref 134–144)
Total Protein: 6.7 g/dL (ref 6.0–8.5)

## 2019-12-30 LAB — TSH: TSH: 3.6 u[IU]/mL (ref 0.450–4.500)

## 2019-12-30 LAB — PRO B NATRIURETIC PEPTIDE: NT-Pro BNP: 552 pg/mL (ref 0–738)

## 2020-01-02 ENCOUNTER — Encounter: Payer: Self-pay | Admitting: Adult Health

## 2020-01-07 ENCOUNTER — Telehealth: Payer: Self-pay | Admitting: Adult Health

## 2020-01-07 NOTE — Telephone Encounter (Signed)
I am fine with that medication   Looks like her last CR was 1.02, GFR 52, BUN 21

## 2020-01-07 NOTE — Telephone Encounter (Signed)
Spoke to [place] and informed her that [name] has gave his authorization.  Gave her CR level.  Nothing further needed.

## 2020-01-07 NOTE — Telephone Encounter (Signed)
Pt has moderate to severe depression and severe anxiety. Waunita Schooner is wondering [name]'s thoughts about starting cymbalta b/c she has chronic pain and neuropathy but she would need to know her Cr Cl --she is able to prescribe the medication but wanted to check with PCP  [place] can be reached at [phone]

## 2020-01-12 ENCOUNTER — Ambulatory Visit (HOSPITAL_COMMUNITY): Payer: Medicare Other | Attending: Cardiovascular Disease

## 2020-01-12 ENCOUNTER — Other Ambulatory Visit: Payer: Self-pay

## 2020-01-12 DIAGNOSIS — I5022 Chronic systolic (congestive) heart failure: Secondary | ICD-10-CM | POA: Insufficient documentation

## 2020-01-13 ENCOUNTER — Telehealth: Payer: Self-pay | Admitting: Cardiovascular Disease

## 2020-01-13 ENCOUNTER — Encounter: Payer: Self-pay | Admitting: Cardiovascular Disease

## 2020-01-13 NOTE — Telephone Encounter (Signed)
Spoke with patient regarding appointment for Cardiac MRI scheduled 02/02/20 at 12:00pm at [facility].  Arrival time is 11:15 am 1st floor radiology department.  Will mail information to patient and it is also available in My Chart

## 2020-01-14 ENCOUNTER — Other Ambulatory Visit: Payer: Self-pay

## 2020-01-14 NOTE — Progress Notes (Unsigned)
Called patient with results. Per Dr. Eden Emms, Does not need MRI since echo looks so much better. Canceled MRI for patient. Patient agreed to plan.

## 2020-01-19 DIAGNOSIS — M4185 Other forms of scoliosis, thoracolumbar region: Secondary | ICD-10-CM | POA: Diagnosis not present

## 2020-01-19 DIAGNOSIS — M4326 Fusion of spine, lumbar region: Secondary | ICD-10-CM | POA: Diagnosis not present

## 2020-01-19 DIAGNOSIS — Z6826 Body mass index (BMI) 26.0-26.9, adult: Secondary | ICD-10-CM | POA: Diagnosis not present

## 2020-01-19 DIAGNOSIS — M47894 Other spondylosis, thoracic region: Secondary | ICD-10-CM | POA: Diagnosis not present

## 2020-01-21 ENCOUNTER — Other Ambulatory Visit: Payer: Self-pay | Admitting: Orthopaedic Surgery

## 2020-01-21 DIAGNOSIS — M4185 Other forms of scoliosis, thoracolumbar region: Secondary | ICD-10-CM

## 2020-01-24 DIAGNOSIS — J449 Chronic obstructive pulmonary disease, unspecified: Secondary | ICD-10-CM | POA: Diagnosis not present

## 2020-01-24 DIAGNOSIS — J9611 Chronic respiratory failure with hypoxia: Secondary | ICD-10-CM | POA: Diagnosis not present

## 2020-01-25 ENCOUNTER — Other Ambulatory Visit: Payer: Self-pay | Admitting: Adult Health

## 2020-01-25 DIAGNOSIS — Z760 Encounter for issue of repeat prescription: Secondary | ICD-10-CM

## 2020-02-02 ENCOUNTER — Other Ambulatory Visit (HOSPITAL_COMMUNITY): Payer: Medicare Other

## 2020-02-03 ENCOUNTER — Telehealth: Payer: Self-pay | Admitting: Adult Health

## 2020-02-03 DIAGNOSIS — M8949 Other hypertrophic osteoarthropathy, multiple sites: Secondary | ICD-10-CM

## 2020-02-03 DIAGNOSIS — M159 Polyosteoarthritis, unspecified: Secondary | ICD-10-CM

## 2020-02-03 NOTE — Telephone Encounter (Signed)
Julie Rice from [facility] states that th pt is having pain in multiple parts of her body and the Tramadol 50 mg does not work. Pt has been self medication with extra strength tylenol every 3 hours but it is pulling on her mental health. [name] is wondering if PCP will address the pain or referral her to a pain management specialist   [name] states that she has put the pt on cymbalta 20mg  and she is adding today Seroquel 25 mg at bedtime to treat anxiety and bi-polar disorder.   can be reached at [phone]

## 2020-02-03 NOTE — Telephone Encounter (Signed)
Referral placed.  Julie Rice wanted to know if pt had a dx of bipolar in her chart.  Advised that we have both anxiety and depression but not bipolar.  [name] stated the pt gave extensive hx of her diagnosis and asked this information to be forwarded to [facility].  The diagnosis provided was Dr. [name].

## 2020-02-03 NOTE — Telephone Encounter (Signed)
We can refer her to pain management or she can follow-up with her rheumatologist see if they can help her with the chronic pain

## 2020-02-08 ENCOUNTER — Encounter: Payer: Self-pay | Admitting: Adult Health

## 2020-02-08 ENCOUNTER — Encounter: Payer: Self-pay | Admitting: Physical Medicine and Rehabilitation

## 2020-02-16 ENCOUNTER — Other Ambulatory Visit: Payer: Self-pay

## 2020-02-16 ENCOUNTER — Encounter: Payer: Self-pay | Admitting: Physical Medicine and Rehabilitation

## 2020-02-16 ENCOUNTER — Encounter
Payer: Medicare Other | Attending: Physical Medicine and Rehabilitation | Admitting: Physical Medicine and Rehabilitation

## 2020-02-16 VITALS — BP 131/68 | HR 71 | Temp 97.7°F | Ht 62.0 in | Wt 140.2 lb

## 2020-02-16 DIAGNOSIS — G894 Chronic pain syndrome: Secondary | ICD-10-CM | POA: Insufficient documentation

## 2020-02-16 DIAGNOSIS — Z79891 Long term (current) use of opiate analgesic: Secondary | ICD-10-CM | POA: Insufficient documentation

## 2020-02-16 DIAGNOSIS — M069 Rheumatoid arthritis, unspecified: Secondary | ICD-10-CM | POA: Diagnosis not present

## 2020-02-16 DIAGNOSIS — M7918 Myalgia, other site: Secondary | ICD-10-CM | POA: Insufficient documentation

## 2020-02-16 DIAGNOSIS — M797 Fibromyalgia: Secondary | ICD-10-CM | POA: Insufficient documentation

## 2020-02-16 DIAGNOSIS — Z5181 Encounter for therapeutic drug level monitoring: Secondary | ICD-10-CM | POA: Insufficient documentation

## 2020-02-16 MED ORDER — TRAMADOL HCL 50 MG PO TABS: 50.0000 mg | ORAL_TABLET | Freq: Three times a day (TID) | ORAL | 3 refills | Status: DC

## 2020-02-16 MED ORDER — DULOXETINE HCL 20 MG PO CPEP: 40.0000 mg | ORAL_CAPSULE | Freq: Every day | ORAL | 1 refills | Status: DC

## 2020-02-16 NOTE — Patient Instructions (Signed)
Patient is an 84 yr old- HTN, CAD, Afib- on Eliquis, frequent pneumonia- CHF and COPD, on O2 at night (weaned to night only). Has fibromyalgia Has RA, DJD, and scoliosis and had L4th digit PIP replaced in 1970s and R TKR in 1990 and needs L TKR- bone on bone- doesn't want surgery. Has depression/anxiety- never been told had bipolar disorder except 1 surgeon. Here for evaluation-Has fibromyalgia, Myofascial pain syndrome and DJD/RA and scoliosis  1. Tramadol - 50 mg 3x/day- with tramadol pt can take 1 tylenol- take TOGETHER. NO extra tylenol- can cause MILD constipation. Can increase in future  2. Increase Duloxetine 40 mg daily x 1 week then 60 mg daily- a good dose for depression AND nerve pain.   3. Trigger point release- Theracane- buy on Amazon- 2-4 minutes of firm pressure, not massage- that will help the muscle relax. Tennis balls- same way as theracane for MUSCLE pain.   4. Trigger point injections- schedule an appointment-  5. Voltaren gel- Over the counter- can use 4x/day as needed for HAND arthritis- just put on dollop and rub it in-   6. Has Rheumatologist as well.   7. Opiate contract and urine drug screen.   8.  F/U in 1-2 weeks. Will discuss steroid injections at next visit.

## 2020-02-16 NOTE — Progress Notes (Signed)
Subjective:    Patient ID: Julie Rice, female    DOB: [birth year], 84 y.o.   MRN: [id number]  HPI   Patient is an 84 yr old- HTN, CAD, Afib- on Eliquis, frequent pneumonia- CHF and COPD, on O2 at night (weaned to night only). Has fibromyalgia Has RA, DJD, and scoliosis and had L4th digit PIP replaced in 1970s and R TKR in 1990 and needs L TKR- bone on bone- doesn't want surgery. Has depression/anxiety- never been told had bipolar disorder except 1 surgeon. Here for evaluation   Pain worst at: Upper back, top/chest, and under upper ribs esp when tries to straighten up. Is scheduled for MRI of back 4/24- on Saturday.  Is seeing Dr Patrice Paradise- a spine specialist.    Used to be 5 ft 5inches- now 5 ft 2inches.   When laying night- has pain in L knee- if pops, helps somewhat.    On gabapentin-  In pain from neck down- doesn't think gabapentin helps.   Takes tylenol- taking  1500 mg 6x/day-  Took tramadol in past- not sure if more helpful than tylenol.   Pain- mostly stabbing pain- if leans forward or straighten up.  Has a back brace- but when wears- low back pain worse.   Has 2 rods, 6 screws and 2 cages in low back- last surgery- late 1990s- had multiple surgeries over 1990s.   Tried: Gabapentin 100 mg TID On Klonopin prn for anxiety Duloxetine 20 mg daily- for depression- just started on it No side effects- been on it 3 weeks.  No DMARDS for RA  Social Hx: Son died- killed by drunk driver; , then other son commited suicide by hanging 2002, and then husband passed away 4 yrs ago.     Pain Inventory Average Pain 9 Pain Right Now 8 My pain is stabbing  In the last 24 hours, has pain interfered with the following? General activity 8 Relation with others 5 Enjoyment of life 7 What TIME of day is your pain at its worst? morning Sleep (in general) NA  Pain is worse with: walking, bending and standing Pain improves with: therapy/exercise and medication Relief from Meds:  8  Mobility use a cane how many minutes can you walk? 10 ability to climb steps?  no do you drive?  yes  Function retired  Neuro/Psych bladder control problems weakness trouble walking anxiety  Prior Studies Any changes since last visit?  no  Physicians involved in your care Any changes since last visit?  no   Family History  Problem Relation Age of Onset  . Kidney disease Mother   . Hypertension Mother   . Heart disease Father 79       die of MI at age 39  . Suicidality Son   . Heart disease Brother   . Heart attack Brother   . Colon cancer Neg Hx   . Esophageal cancer Neg Hx   . Pancreatic cancer Neg Hx   . Rectal cancer Neg Hx   . Stomach cancer Neg Hx    Social History   Socioeconomic History  . Marital status: Widowed    Spouse name: Not on file  . Number of children: 2  . Years of education: Not on file  . Highest education level: Not on file  Occupational History    Employer: RETIRED  Tobacco Use  . Smoking status: Never Smoker  . Smokeless tobacco: Never Used  Substance and Sexual Activity  . Alcohol use: Never  .  Drug use: Never  . Sexual activity: Not Currently  Other Topics Concern  . Not on file  Social History Narrative   Retired    Widowed   Social Determinants of Corporate investment banker Strain:   . Difficulty of Paying Living Expenses:   Food Insecurity:   . Worried About Programme researcher, broadcasting/film/video in the Last Year:   . Barista in the Last Year:   Transportation Needs:   . Freight forwarder (Medical):   Marland Kitchen Lack of Transportation (Non-Medical):   Physical Activity:   . Days of Exercise per Week:   . Minutes of Exercise per Session:   Stress:   . Feeling of Stress :   Social Connections:   . Frequency of Communication with Friends and Family:   . Frequency of Social Gatherings with Friends and Family:   . Attends Religious Services:   . Active Member of Clubs or Organizations:   . Attends Banker  Meetings:   Marland Kitchen Marital Status:    Past Surgical History:  Procedure Laterality Date  . ABDOMINAL HYSTERECTOMY  1982  . BACK SURGERY    . BALLOON DILATION N/A 09/21/2018   Procedure: BALLOON DILATION;  Surgeon: Hilarie Fredrickson, MD;  Location: [facility] END[facility]COPY;  Service: Endoscopy;  Laterality: N/A;  . BALLOON DILATION N/A 08/12/2019   Procedure: BALLOON DILATION;  Surgeon: Lynann Bologna, MD;  Location: [facility] ENDOSCOPY;  Service: Endoscopy;  Laterality: N/A;  . BIOPSY  08/12/2019   Procedure: BIOPSY;  Surgeon: Lynann Bologna, MD;  Location: [facility] ENDOSCOPY;  Service: Endoscopy;;  . BLADDER SUSPENSION  1980's  . BOTOX INJECTION N/A 09/21/2018   Procedure: BOTOX INJECTION;  Surgeon: Hilarie Fredrickson, MD;  Location: [facility] ENDOSCOPY;  Service: Endoscopy;  Laterality: N/A;  . BOTOX INJECTION N/A 08/12/2019   Procedure: BOTOX INJECTION;  Surgeon: Lynann Bologna, MD;  Location: [facility] ENDOSCOPY;  Service: Endoscopy;  Laterality: N/A;  . CATARACT EXTRACTION W/ INTRAOCULAR LENS  IMPLANT, BILATERAL Bilateral 2010  . DILATION AND CURETTAGE OF UTERUS  1961  . ESOPHAGEAL MANOMETRY N/A 03/25/2018   Procedure: ESOPHAGEAL MANOMETRY (EM);  Surgeon: Napoleon Form, MD;  Location: [facility] ENDOSCOPY;  Service: Endoscopy;  Laterality: N/A;  . ESOPHAGOGASTRODUODENOSCOPY (EGD) WITH PROPOFOL N/A 04/07/2018   Procedure: ESOPHAGOGASTRODUODENOSCOPY (EGD) WITH PROPOFOL;  Surgeon: Sherrilyn Rist, MD;  Location: [facility] ENDOSCOPY;  Service: Gastroenterology;  Laterality: N/A;  . ESOPHAGOGASTRODUODENOSCOPY (EGD) WITH PROPOFOL N/A 09/21/2018   Procedure: ESOPHAGOGASTRODUODENOSCOPY (EGD) WITH PROPOFOL;  Surgeon: Hilarie Fredrickson, MD;  Location: [facility] ENDOSCOPY;  Service: Endoscopy;  Laterality: N/A;  . ESOPHAGOGASTRODUODENOSCOPY (EGD) WITH PROPOFOL N/A 08/12/2019   Procedure: ESOPHAGOGASTRODUODENOSCOPY (EGD) WITH PROPOFOL;  Surgeon: Lynann Bologna, MD;  Location: [facility] ENDOSCOPY;  Service: Endoscopy;  Laterality: N/A;  . HERNIA REPAIR    . HIATAL HERNIA REPAIR N/A  08/02/2016   Procedure: LAPAROSCOPIC REPAIR OF LARGE  HIATAL HERNIA;  Surgeon: Glenna Fellows, MD;  Location: [facility] ORS;  Service: General;  Laterality: N/A;  . JOINT REPLACEMENT    . LAPAROSCOPIC NISSEN FUNDOPLICATION N/A 08/02/2016   Procedure: LAPAROSCOPIC NISSEN FUNDOPLICATION;  Surgeon: Glenna Fellows, MD;  Location: [facility] ORS;  Service: General;  Laterality: N/A;  . LUMBAR LAMINECTOMY  1990; 1994; 1700;1749   "I've got 2 stainless steel rods; 6 screws; 2 ray cages"took bone from right hip to put in back  . RIGHT/LEFT HEART CATH AND CORONARY ANGIOGRAPHY N/A 07/26/2019   Procedure: RIGHT/LEFT HEART CATH AND CORONARY ANGIOGRAPHY;  Surgeon: Verdis Prime  W, MD;  Location: [facility] INVASIVE CV LAB;  Service: Cardiovascular;  Laterality: N/A;  . TEE WITHOUT CARDIOVERSION N/A 08/19/2019   Procedure: TRANSESOPHAGEAL ECHOCARDIOGRAM (TEE);  Surgeon: Lewayne Bunting, MD;  Location: [facility] ENDOSCOPY;  Service: Cardiovascular;  Laterality: N/A;  . TONSILLECTOMY AND ADENOIDECTOMY  1945  . TOTAL KNEE ARTHROPLASTY Right ~ 1996  . TUBAL LIGATION  ~ 1976   Past Medical History:  Diagnosis Date  . Anemia   . Anxiety   . Bipolar disorder (HCC)   . Blood transfusion 1991   autologous pts own blood given   . CAD (coronary artery disease)   . Cataract   . Chest pain    "@ rest, lying down, w/exertion"  . Chronic back pain    "mostly lower back but I do have upper back pain regularly" (04/06/2018)  . COPD (chronic obstructive pulmonary disease) (HCC)   . Depression   . Esophageal stricture   . Fibromyalgia   . GERD (gastroesophageal reflux disease)   . Heart murmur    "slight" (04/06/2018)  . Hiatal hernia   . Hyperlipidemia    Patient denies  . Hypertension   . Internal hemorrhoids   . Lumbago   . Mitral regurgitation   . Osteoarthritis   . Osteoporosis   . Pneumonia ~ 01/2018  . PONV (postoperative nausea and vomiting)    severe ponv, "in the past" (04/06/2018)  . Rectal bleeding   . Rheumatoid  arthritis (HCC)   . Spondylosis   . TIA (transient ischemic attack) 2013  . Urinary incontinence    wears depends   BP 131/68   Pulse 71   Temp 97.7 F (36.5 C)   Ht 5\' 2"  (1.575 m)   Wt 140 lb 3.2 oz (63.6 kg)   SpO2 97% Comment: wears o2 at hs  BMI 25.64 kg/m   Opioid Risk Score:   Fall Risk Score:  `1  Depression screen PHQ 2/9  Depression screen PHQ 2/9 02/16/2020  Decreased Interest 2  PHQ - 2 Score 2  Altered sleeping 2  Tired, decreased energy 2  Change in appetite 0  Feeling bad or failure about yourself  2  Trouble concentrating 2  Moving slowly or fidgety/restless 0  Suicidal thoughts 0  PHQ-9 Score 10  Some recent data might be hidden    Review of Systems  Constitutional: Negative.   HENT: Negative.   Eyes: Negative.   Respiratory: Negative.   Cardiovascular: Negative.   Gastrointestinal: Negative.   Endocrine: Negative.   Genitourinary:       Bladder control  Musculoskeletal: Positive for gait problem.  Skin: Negative.   Allergic/Immunologic: Negative.   Hematological: Bruises/bleeds easily.       Eliquis  Psychiatric/Behavioral: The patient is nervous/anxious.   All other systems reviewed and are negative.      Objective:   Physical Exam  Awake, alert, appropriate, talkative, sitting on table, NAD Ulnar deviation of fingers esp 2nd/3rd digits  Trigger points in  Upper traps, rhomboids, levators and scalenes- most in rhomboids- esp due to scoliosis which is also noticeable on exam  Kyphotic notably- leaning forward- so much that chest is almost on stomach when standing up MS: 5-/5 in deltoids, biceps, triceps, WE, grip and finger abd- slightly less than expected B/L LEs- HF 4+/5, KE 5-/5 DF and PF 5-/5- B/L  Tender points 12+ on exam  1-2+ LE edema to ankles B/L with cool extremities      Assessment & Plan:  Patient is an 84 yr old- HTN, CAD, Afib- on Eliquis, frequent pneumonia- CHF and COPD, on O2 at night (weaned to night  only). Has fibromyalgia Has RA, DJD, and scoliosis and had L4th digit PIP replaced in 1970s and R TKR in 1990 and needs L TKR- bone on bone- doesn't want surgery. Has depression/anxiety- never been told had bipolar disorder except 1 surgeon. Here for evaluation-Has fibromyalgia, Myofascial pain syndrome and DJD/RA and scoliosis  1. Tramadol - 50 mg 3x/day- with tramadol pt can take 1 tylenol- take TOGETHER. NO extra tylenol- can cause MILD constipation.  Can increase in future if needed  2. Increase Duloxetine 40 mg daily x 1 week then 60 mg daily- a good dose for depression AND nerve pain.   3. Trigger point release- Theracane- buy on [employer]- 2-4 minutes of firm pressure, not massage- that will help the muscle relax. Tennis balls- same way as theracane for MUSCLE pain.   4. Trigger point injections- schedule an appointment-  5. Voltaren gel- Over the counter- can use 4x/day as needed for HAND arthritis- just put on dollop and rub it in-   6. Has Rheumatologist as well.   7. Opiate contract and urine drug screen.   8.  F/U in 1-2 weeks. Discuss steroid injections next visit.   I spent a total of 50 minutes on appointment- more than 30 minutes on plan as detailed above.

## 2020-02-20 LAB — TOXASSURE SELECT,+ANTIDEPR,UR

## 2020-02-21 ENCOUNTER — Telehealth: Payer: Self-pay | Admitting: *Deleted

## 2020-02-21 NOTE — Telephone Encounter (Signed)
Urine drug screen was positive for metabolites of clonazepam, which is prescribed.

## 2020-02-24 DIAGNOSIS — J449 Chronic obstructive pulmonary disease, unspecified: Secondary | ICD-10-CM | POA: Diagnosis not present

## 2020-02-24 DIAGNOSIS — J9611 Chronic respiratory failure with hypoxia: Secondary | ICD-10-CM | POA: Diagnosis not present

## 2020-02-26 ENCOUNTER — Ambulatory Visit
Admission: RE | Admit: 2020-02-26 | Discharge: 2020-02-26 | Disposition: A | Discharge status: Home / Self Care | Payer: Medicare Other | Source: Ambulatory Visit | Attending: Orthopaedic Surgery | Admitting: Orthopaedic Surgery

## 2020-02-26 ENCOUNTER — Other Ambulatory Visit: Payer: Self-pay | Admitting: Adult Health

## 2020-02-26 ENCOUNTER — Other Ambulatory Visit: Payer: Self-pay

## 2020-02-26 DIAGNOSIS — M546 Pain in thoracic spine: Secondary | ICD-10-CM | POA: Diagnosis not present

## 2020-02-26 DIAGNOSIS — M4804 Spinal stenosis, thoracic region: Secondary | ICD-10-CM | POA: Diagnosis not present

## 2020-02-26 DIAGNOSIS — M4185 Other forms of scoliosis, thoracolumbar region: Secondary | ICD-10-CM

## 2020-02-26 DIAGNOSIS — M48061 Spinal stenosis, lumbar region without neurogenic claudication: Secondary | ICD-10-CM | POA: Diagnosis not present

## 2020-02-26 DIAGNOSIS — Z760 Encounter for issue of repeat prescription: Secondary | ICD-10-CM

## 2020-03-01 ENCOUNTER — Other Ambulatory Visit: Payer: Self-pay

## 2020-03-01 ENCOUNTER — Encounter: Payer: Self-pay | Admitting: Physical Medicine and Rehabilitation

## 2020-03-01 ENCOUNTER — Encounter: Payer: Medicare Other | Admitting: Physical Medicine and Rehabilitation

## 2020-03-01 VITALS — BP 148/73 | HR 59 | Temp 97.5°F | Ht 61.5 in | Wt 138.0 lb

## 2020-03-01 DIAGNOSIS — M7918 Myalgia, other site: Secondary | ICD-10-CM | POA: Diagnosis not present

## 2020-03-01 DIAGNOSIS — Z5181 Encounter for therapeutic drug level monitoring: Secondary | ICD-10-CM | POA: Diagnosis not present

## 2020-03-01 DIAGNOSIS — M069 Rheumatoid arthritis, unspecified: Secondary | ICD-10-CM

## 2020-03-01 DIAGNOSIS — M797 Fibromyalgia: Secondary | ICD-10-CM | POA: Diagnosis not present

## 2020-03-01 DIAGNOSIS — Z79891 Long term (current) use of opiate analgesic: Secondary | ICD-10-CM | POA: Diagnosis not present

## 2020-03-01 DIAGNOSIS — G894 Chronic pain syndrome: Secondary | ICD-10-CM

## 2020-03-01 NOTE — Patient Instructions (Signed)
Plan: 1. Patient here for trigger point injections for secondary myofascial pain  Consent done and on chart.  Cleaned areas with alcohol and injected using a 27 gauge 1.5 inch needle  Injected 4cc Using 1% Lidocaine with no EPI  Upper traps L only Levators L only Posterior scalenes B/L Middle scalenes Splenius Capitus Pectoralis Major Rhomboids B/L Infraspinatus Teres Major/minor Thoracic paraspinals B/L Lumbar paraspinals B/L x2 Other injections-    Patient's level of pain prior was 8/10  Current level of pain after injections is 5/10 at most  There was no bleeding or complications.  Patient was advised to drink a lot of water on day after injections to flush system Will have increased soreness for 12-48 hours after injections.  Can use Lidocaine patches the day AFTER injections Can use theracane on day after injections Can use heating pad or ice 4-6 hours AFTER injections  2. Use theracane or tennis balls as possible. Lean against abll that you put on wall- and hold firm  pressure-2-4 minutes on each spot.  no massage-   3. F/U 4-6 weeks.

## 2020-03-01 NOTE — Progress Notes (Signed)
  Tramadol has really helped.  Needs a refill- has 3 refills  Has overall helped pain.  Can now use cane and walk in front yard.  Doesn't hurt as bad.   Went over thoracic and lumbar MRIs personally.  Has T10 B/L severe stenosis- and issues below T9.10 to L1-   . Moderate or severe neural foraminal stenosis at the left T1, right T6, and bilateral T9 through T12 nerve levels.    Plan: 1. Patient here for trigger point injections for secondary myofascial pain  Consent done and on chart.  Cleaned areas with alcohol and injected using a 27 gauge 1.5 inch needle  Injected 4cc Using 1% Lidocaine with no EPI  Upper traps L only Levators L only Posterior scalenes B/L Middle scalenes Splenius Capitus Pectoralis Major Rhomboids B/L Infraspinatus Teres Major/minor Thoracic paraspinals B/L Lumbar paraspinals B/L x2 Other injections-    Patient's level of pain prior was 8/10  Current level of pain after injections is 5/10 at most  There was no bleeding or complications.  Patient was advised to drink a lot of water on day after injections to flush system Will have increased soreness for 12-48 hours after injections.  Can use Lidocaine patches the day AFTER injections Can use theracane on day after injections Can use heating pad or ice 4-6 hours AFTER injections  2. Use theracane or tennis balls as possible. Lean against abll that you put on wall- and hold firm  pressure-2-4 minutes on each spot.  no massage-   3. F/U 4-6 weeks.

## 2020-03-04 ENCOUNTER — Inpatient Hospital Stay (HOSPITAL_COMMUNITY): Admission: RE | Admit: 2020-03-04 | Payer: Medicare Other | Source: Ambulatory Visit

## 2020-03-08 ENCOUNTER — Other Ambulatory Visit: Payer: Self-pay

## 2020-03-09 ENCOUNTER — Ambulatory Visit (INDEPENDENT_AMBULATORY_CARE_PROVIDER_SITE_OTHER): Payer: Medicare Other | Admitting: Adult Health

## 2020-03-09 ENCOUNTER — Encounter: Payer: Self-pay | Admitting: Adult Health

## 2020-03-09 VITALS — BP 110/52 | Temp 97.9°F | Wt 142.0 lb

## 2020-03-09 DIAGNOSIS — Z0000 Encounter for general adult medical examination without abnormal findings: Secondary | ICD-10-CM | POA: Diagnosis not present

## 2020-03-09 DIAGNOSIS — J449 Chronic obstructive pulmonary disease, unspecified: Secondary | ICD-10-CM

## 2020-03-09 DIAGNOSIS — I5022 Chronic systolic (congestive) heart failure: Secondary | ICD-10-CM | POA: Diagnosis not present

## 2020-03-09 DIAGNOSIS — F3340 Major depressive disorder, recurrent, in remission, unspecified: Secondary | ICD-10-CM

## 2020-03-09 DIAGNOSIS — I10 Essential (primary) hypertension: Secondary | ICD-10-CM | POA: Diagnosis not present

## 2020-03-09 DIAGNOSIS — M7918 Myalgia, other site: Secondary | ICD-10-CM

## 2020-03-09 DIAGNOSIS — G894 Chronic pain syndrome: Secondary | ICD-10-CM | POA: Diagnosis not present

## 2020-03-09 DIAGNOSIS — I4891 Unspecified atrial fibrillation: Secondary | ICD-10-CM | POA: Diagnosis not present

## 2020-03-09 DIAGNOSIS — R0600 Dyspnea, unspecified: Secondary | ICD-10-CM

## 2020-03-09 DIAGNOSIS — M797 Fibromyalgia: Secondary | ICD-10-CM

## 2020-03-09 DIAGNOSIS — R0609 Other forms of dyspnea: Secondary | ICD-10-CM

## 2020-03-09 LAB — COMPREHENSIVE METABOLIC PANEL
ALT: 14 U/L (ref 0–35)
AST: 19 U/L (ref 0–37)
Albumin: 4.1 g/dL (ref 3.5–5.2)
Alkaline Phosphatase: 67 U/L (ref 39–117)
BUN: 27 mg/dL — ABNORMAL HIGH (ref 6–23)
CO2: 28 mEq/L (ref 19–32)
Calcium: 8.9 mg/dL (ref 8.4–10.5)
Chloride: 102 mEq/L (ref 96–112)
Creatinine, Ser: 1.01 mg/dL (ref 0.40–1.20)
GFR: 52.45 mL/min — ABNORMAL LOW (ref >60.00)
Glucose, Bld: 80 mg/dL (ref 70–99)
Potassium: 5.1 mEq/L (ref 3.5–5.1)
Sodium: 136 mEq/L (ref 135–145)
Total Bilirubin: 0.4 mg/dL (ref 0.2–1.2)
Total Protein: 6.6 g/dL (ref 6.0–8.3)

## 2020-03-09 LAB — CBC WITH DIFFERENTIAL/PLATELET
Basophils Absolute: 0.1 10*3/uL (ref 0.0–0.1)
Basophils Relative: 1.5 % (ref 0.0–3.0)
Eosinophils Absolute: 0.3 10*3/uL (ref 0.0–0.7)
Eosinophils Relative: 4.7 % (ref 0.0–5.0)
HCT: 35.7 % — ABNORMAL LOW (ref 36.0–46.0)
Hemoglobin: 11.8 g/dL — ABNORMAL LOW (ref 12.0–15.0)
Lymphocytes Relative: 10.8 % — ABNORMAL LOW (ref 12.0–46.0)
Lymphs Abs: 0.7 10*3/uL (ref 0.7–4.0)
MCHC: 33 g/dL (ref 30.0–36.0)
MCV: 95.6 fl (ref 78.0–100.0)
Monocytes Absolute: 0.8 10*3/uL (ref 0.1–1.0)
Monocytes Relative: 13 % — ABNORMAL HIGH (ref 3.0–12.0)
Neutro Abs: 4.2 10*3/uL (ref 1.4–7.7)
Neutrophils Relative %: 70 % (ref 43.0–77.0)
Platelets: 231 10*3/uL (ref 150.0–400.0)
RBC: 3.73 Mil/uL — ABNORMAL LOW (ref 3.87–5.11)
RDW: 14.1 % (ref 11.5–15.5)
WBC: 6 10*3/uL (ref 4.0–10.5)

## 2020-03-09 LAB — LIPID PANEL
Cholesterol: 172 mg/dL (ref 0–200)
HDL: 65.6 mg/dL (ref >39.00)
LDL Cholesterol: 84 mg/dL (ref 0–99)
NonHDL: 106.09
Total CHOL/HDL Ratio: 3
Triglycerides: 111 mg/dL (ref 0.0–149.0)
VLDL: 22.2 mg/dL (ref 0.0–40.0)

## 2020-03-09 LAB — TSH: TSH: 4.89 u[IU]/mL — ABNORMAL HIGH (ref 0.35–4.50)

## 2020-03-09 NOTE — Patient Instructions (Addendum)
It was great seeing you today and I am glad you are starting to feel better.   I will follow up with you regarding your blood work   Continue to see all your specialists as directed.   If you need anything, please let me know

## 2020-03-09 NOTE — Progress Notes (Signed)
Subjective:    Patient ID: Julie Rice, female    DOB: 05-Feb-1936, 84 y.o.   MRN: [id number]  HPI Patient presents for yearly preventative medicine examination. She is a pleasant 84 year old female who  has a past medical history of Anemia, Anxiety, Bipolar disorder (HCC), Blood transfusion (1991), CAD (coronary artery disease), Cataract, Chest pain, Chronic back pain, COPD (chronic obstructive pulmonary disease) (HCC), Depression, Esophageal stricture, Fibromyalgia, GERD (gastroesophageal reflux disease), Heart murmur, Hiatal hernia, Hyperlipidemia, Hypertension, Internal hemorrhoids, Lumbago, Mitral regurgitation, Osteoarthritis, Osteoporosis, Pneumonia (~ 01/2018), PONV (postoperative nausea and vomiting), Rectal bleeding, Rheumatoid arthritis (HCC), Spondylosis, TIA (transient ischemic attack) (2013), and Urinary incontinence.  2019 -2020 was a very trying period for her.   Atrial Fibrillation -she was admitted to the hospital on 08/16/2019 with complaints of intermittent chest pain and she was noted to have irregular pulse by her home health care nurse.  She was found to be in A. fib with RVR.  Her echo in the hospital showed an EF of 40 to 45% but a few days later her TEE showed a EF of 25 to 30%.  She was originally placed on diltiazem but then this was switched to a beta-blocker.  She continued to have severe symptoms and was placed on amiodarone for rhythm control.  She converted to sinus rhythm prior to electrical cardioversion.  She was placed on Eliquis for stroke protection.  Thankfully her most recent echo done in March 2020 showed EF that improved has improved to 50 to 55%.  CHF -currently managed by cardiology.  Is on Coreg, Entresto, and spironolactone.  She does have a prescription for Lasix 40 mg that she can use as needed for increased weight, edema, and shortness of breath.  COPD -followed by pulmonary.  She does use 2 L of oxygen at night.  Continues to have DOE and reports  that she does have an upcoming pulmonary function test to be scheduled with Dr. Evern Core office  GERD - Takes Prilosec 20 mg daily.   Chronic Pain Management -history of fibromyalgia, myofascial pain syndrome, DJD, RA, and scoliosis.  Recently started being  seen by physical medicine and rehab.  Currently prescribed tramadol 50 mg 3 times a day, Cymbalta 60 mg daily( for depression and nerve pain), just received trigger point injections.  She does report since starting with pain management that her pain is much better controlled and is usually at a 5 out of 10.  She is going to continue to be seen by the scoliosis center to see how they can help with her pain.  She has started to use a cane for extra support with ambulation  Depression - recently started on Cymbalta. Feels as though it is starting to work   All immunizations and health maintenance protocols were reviewed with the patient and needed orders were placed. She refuses COVID 19 vaccination at this time  Appropriate screening laboratory values were ordered for the patient including screening of hyperlipidemia, renal function and hepatic function.   Medication reconciliation,  past medical history, social history, problem list and allergies were reviewed in detail with the patient  Goals were established with regard to weight loss, exercise, and  diet in compliance with medications  Wt Readings from Last 3 Encounters:  03/09/20 142 lb (64.4 kg)  03/01/20 138 lb (62.6 kg)  02/16/20 140 lb 3.2 oz (63.6 kg)    Review of Systems  Constitutional: Positive for activity change.  HENT: Negative.   Eyes:  Negative.   Respiratory: Positive for shortness of breath.   Cardiovascular: Negative.   Gastrointestinal: Negative.   Endocrine: Negative.   Genitourinary: Negative.   Musculoskeletal: Positive for arthralgias, back pain, gait problem and myalgias.  Skin: Negative.   Hematological: Negative.   Psychiatric/Behavioral: Positive for  decreased concentration and sleep disturbance.  All other systems reviewed and are negative.  Past Medical History:  Diagnosis Date  . Anemia   . Anxiety   . Bipolar disorder (HCC)   . Blood transfusion 1991   autologous pts own blood given   . CAD (coronary artery disease)   . Cataract   . Chest pain    "@ rest, lying down, w/exertion"  . Chronic back pain    "mostly lower back but I do have upper back pain regularly" (04/06/2018)  . COPD (chronic obstructive pulmonary disease) (HCC)   . Depression   . Esophageal stricture   . Fibromyalgia   . GERD (gastroesophageal reflux disease)   . Heart murmur    "slight" (04/06/2018)  . Hiatal hernia   . Hyperlipidemia    Patient denies  . Hypertension   . Internal hemorrhoids   . Lumbago   . Mitral regurgitation   . Osteoarthritis   . Osteoporosis   . Pneumonia ~ 01/2018  . PONV (postoperative nausea and vomiting)    severe ponv, "in the past" (04/06/2018)  . Rectal bleeding   . Rheumatoid arthritis (HCC)   . Spondylosis   . TIA (transient ischemic attack) 2013  . Urinary incontinence    wears depends    Social History   Socioeconomic History  . Marital status: Widowed    Spouse name: Not on file  . Number of children: 2  . Years of education: Not on file  . Highest education level: Not on file  Occupational History    Employer: RETIRED  Tobacco Use  . Smoking status: Never Smoker  . Smokeless tobacco: Never Used  Substance and Sexual Activity  . Alcohol use: Never  . Drug use: Never  . Sexual activity: Not Currently  Other Topics Concern  . Not on file  Social History Narrative   Retired    Widowed   Social Determinants of Corporate investment banker Strain:   . Difficulty of Paying Living Expenses:   Food Insecurity:   . Worried About Programme researcher, broadcasting/film/video in the Last Year:   . Barista in the Last Year:   Transportation Needs:   . Freight forwarder (Medical):   Marland Kitchen Lack of Transportation  (Non-Medical):   Physical Activity:   . Days of Exercise per Week:   . Minutes of Exercise per Session:   Stress:   . Feeling of Stress :   Social Connections:   . Frequency of Communication with Friends and Family:   . Frequency of Social Gatherings with Friends and Family:   . Attends Religious Services:   . Active Member of Clubs or Organizations:   . Attends Banker Meetings:   Marland Kitchen Marital Status:   Intimate Partner Violence:   . Fear of Current or Ex-Partner:   . Emotionally Abused:   Marland Kitchen Physically Abused:   . Sexually Abused:     Past Surgical History:  Procedure Laterality Date  . ABDOMINAL HYSTERECTOMY  1982  . BACK SURGERY    . BALLOON DILATION N/A 09/21/2018   Procedure: BALLOON DILATION;  Surgeon: Hilarie Fredrickson, MD;  Location: [facility] ENDOSCOPY;  Service:  Endoscopy;  Laterality: N/A;  . BALLOON DILATION N/A 08/12/2019   Procedure: BALLOON DILATION;  Surgeon: Lynann Bologna, MD;  Location[facility] ENDOSCOPY;  Service: Endoscopy;  Laterality: N/A;  . BIOPSY  08/12/2019   Procedure: BIOPSY;  Surgeon: Lynann Bologna, MD;  Location: [facility] ENDOSCOPY;  Service: Endoscopy;;  . BLADDER SUSPENSION  1980's  . BOTOX INJECTION N/A 09/21/2018   Procedure: BOTOX INJECTION;  Surgeon: Hilarie Fredrickson, MD;  Location: [facility] ENDOSCOPY;  Service: Endoscopy;  Laterality: N/A;  . BOTOX INJECTION N/A 08/12/2019   Procedure: BOTOX INJECTION;  Surgeon: Lynann Bologna, MD;  Location: [facility] ENDOSCOPY;  Service: Endoscopy;  Laterality: N/A;  . CATARACT EXTRACTION W/ INTRAOCULAR LENS  IMPLANT, BILATERAL Bilateral 2010  . DILATION AND CURETTAGE OF UTERUS  1961  . ESOPHAGEAL MANOMETRY N/A 03/25/2018   Procedure: ESOPHAGEAL MANOMETRY (EM);  Surgeon: Napoleon Form, MD;  Location: [facility] ENDOSCOPY;  Service: Endoscopy;  Laterality: N/A;  . ESOPHAGOGASTRODUODENOSCOPY (EGD) WITH PROPOFOL N/A 04/07/2018   Procedure: ESOPHAGOGASTRODUODENOSCOPY (EGD) WITH PROPOFOL;  Surgeon: Sherrilyn Rist, MD;  Location: [facility] ENDOSCOPY;   Service: Gastroenterology;  Laterality: N/A;  . ESOPHAGOGASTRODUODENOSCOPY (EGD) WITH PROPOFOL N/A 09/21/2018   Procedure: ESOPHAGOGASTRODUODENOSCOPY (EGD) WITH PROPOFOL;  Surgeon: Hilarie Fredrickson, MD;  Location: [facility] ENDOSCOPY;  Service: Endoscopy;  Laterality: N/A;  . ESOPHAGOGASTRODUODENOSCOPY (EGD) WITH PROPOFOL N/A 08/12/2019   Procedure: ESOPHAGOGASTRODUODENOSCOPY (EGD) WITH PROPOFOL;  Surgeon: Lynann Bologna, MD;  Location: [facility] ENDOSCOPY;  Service: Endoscopy;  Laterality: N/A;  . HERNIA REPAIR    . HIATAL HERNIA REPAIR N/A 08/02/2016   Procedure: LAPAROSCOPIC REPAIR OF LARGE  HIATAL HERNIA;  Surgeon: Glenna Fellows, MD;  Location: [facility] ORS;  Service: General;  Laterality: N/A;  . JOINT REPLACEMENT    . LAPAROSCOPIC NISSEN FUNDOPLICATION N/A 08/02/2016   Procedure: LAPAROSCOPIC NISSEN FUNDOPLICATION;  Surgeon: Glenna Fellows, MD;  Location: [facility] ORS;  Service: General;  Laterality: N/A;  . LUMBAR LAMINECTOMY  1990; 1994; [id number]   "I've got 2 stainless steel rods; 6 screws; 2 ray cages"took bone from right hip to put in back  . RIGHT/LEFT HEART CATH AND CORONARY ANGIOGRAPHY N/A 07/26/2019   Procedure: RIGHT/LEFT HEART CATH AND CORONARY ANGIOGRAPHY;  Surgeon: Lyn Records, MD;  Location: [facility] INVASIVE CV LAB;  Service: Cardiovascular;  Laterality: N/A;  . TEE WITHOUT CARDIOVERSION N/A 08/19/2019   Procedure: TRANSESOPHAGEAL ECHOCARDIOGRAM (TEE);  Surgeon: Lewayne Bunting, MD;  Location: [facility] ENDOSCOPY;  Service: Cardiovascular;  Laterality: N/A;  . TONSILLECTOMY AND ADENOIDECTOMY  1945  . TOTAL KNEE ARTHROPLASTY Right ~ 1996  . TUBAL LIGATION  ~ 1976    Family History  Problem Relation Age of Onset  . Kidney disease Mother   . Hypertension Mother   . Heart disease Father 51       die of MI at age 39  . Suicidality Son   . Heart disease Brother   . Heart attack Brother   . Colon cancer Neg Hx   . Esophageal cancer Neg Hx   . Pancreatic cancer Neg Hx   . Rectal cancer Neg Hx   .  Stomach cancer Neg Hx     Allergies  Allergen Reactions  . Morphine Other (See Comments)    Flushing, rash, itching  . Tape Other (See Comments)    Band aides, adhesive tape Redness and pulls skin off    Current Outpatient Medications on File Prior to Visit  Medication Sig Dispense Refill  . albuterol (VENTOLIN HFA) 108 (90 Base) MCG/ACT inhaler Inhale 2  puffs into the lungs every 6 (six) hours as needed for wheezing or shortness of breath. 1 Inhaler 6  . amiodarone (PACERONE) 200 MG tablet Take 1 tablet (200 mg total) by mouth daily. 90 tablet 3  . amLODipine (NORVASC) 2.5 MG tablet TAKE 1 TABLET BY MOUTH EVERY DAY 90 tablet 0  . apixaban (ELIQUIS) 5 MG TABS tablet Take 1 tablet (5 mg total) by mouth 2 (two) times daily. 180 tablet 3  . carvedilol (COREG) 6.25 MG tablet Take 1 tablet (6.25 mg total) by mouth 2 (two) times daily with a meal. 180 tablet 3  . clonazePAM (KLONOPIN) 0.5 MG tablet TAKE 1 TABLET BY MOUTH TWICE A DAY AS NEEDED FOR ANXIETY 60 tablet 1  . DULoxetine (CYMBALTA) 20 MG capsule Take 2 capsules (40 mg total) by mouth daily. X 1 week then 60 mg daily- for nerve pain and mood 90 capsule 1  . famotidine (PEPCID) 20 MG tablet One after supper 30 tablet 11  . furosemide (LASIX) 40 MG tablet Take 1 tablet (40 mg total) by mouth daily. Take 1 tablet by mouth once a day. You may take an extra tablet as needed at 2 PM for increased swelling 180 tablet 3  . gabapentin (NEURONTIN) 100 MG capsule TAKE 1 CAPSULE BY MOUTH THREE TIMES A DAY 270 capsule 1  . omeprazole (PRILOSEC) 20 MG capsule Take 1 capsule (20 mg total) by mouth daily. 90 capsule 3  . sacubitril-valsartan (ENTRESTO) 97-103 MG Take 1 tablet by mouth 2 (two) times daily. 60 tablet 4  . spironolactone (ALDACTONE) 25 MG tablet Take 1 tablet (25 mg total) by mouth daily. 90 tablet 3  . traMADol (ULTRAM) 50 MG tablet Take 1 tablet (50 mg total) by mouth 3 (three) times daily. 90 tablet 3   No current  facility-administered medications on file prior to visit.    BP (!) 110/52   Temp 97.9 F (36.6 C)   Wt 142 lb (64.4 kg)   BMI 26.40 kg/m       Objective:   Physical Exam Vitals and nursing note reviewed.  Constitutional:      General: She is not in acute distress.    Appearance: Normal appearance. She is well-developed. She is not ill-appearing.  HENT:     Head: Normocephalic and atraumatic.     Right Ear: Tympanic membrane, ear canal and external ear normal. There is no impacted cerumen.     Left Ear: Tympanic membrane, ear canal and external ear normal. There is no impacted cerumen.     Nose: Nose normal. No congestion or rhinorrhea.     Mouth/Throat:     Mouth: Mucous membranes are moist.     Pharynx: Oropharynx is clear. No oropharyngeal exudate or posterior oropharyngeal erythema.  Eyes:     General:        Right eye: No discharge.        Left eye: No discharge.     Extraocular Movements: Extraocular movements intact.     Conjunctiva/sclera: Conjunctivae normal.     Pupils: Pupils are equal, round, and reactive to light.  Neck:     Thyroid: No thyromegaly.     Vascular: No carotid bruit.     Trachea: No tracheal deviation.  Cardiovascular:     Rate and Rhythm: Normal rate and regular rhythm.     Pulses: Normal pulses.     Heart sounds: Normal heart sounds. No murmur. No friction rub. No gallop.   Pulmonary:  Effort: No respiratory distress.     Breath sounds: Normal breath sounds. No stridor. No wheezing, rhonchi or rales.     Comments: Heavy breathing  Chest:     Chest wall: No tenderness.  Abdominal:     General: Abdomen is flat. Bowel sounds are normal. There is no distension.     Palpations: Abdomen is soft. There is no mass.     Tenderness: There is no abdominal tenderness. There is no right CVA tenderness, left CVA tenderness, guarding or rebound.     Hernia: No hernia is present.  Musculoskeletal:        General: Tenderness (left knee) present. No  swelling, deformity or signs of injury. Normal range of motion.     Cervical back: Normal range of motion and neck supple.     Right lower leg: No edema.     Left lower leg: No edema.  Lymphadenopathy:     Cervical: No cervical adenopathy.  Skin:    General: Skin is warm and dry.     Coloration: Skin is not jaundiced or pale.     Findings: No bruising, erythema, lesion or rash.  Neurological:     General: No focal deficit present.     Mental Status: She is alert and oriented to person, place, and time.     Cranial Nerves: No cranial nerve deficit.     Sensory: No sensory deficit.     Motor: No weakness.     Coordination: Coordination normal.     Gait: Gait abnormal.     Deep Tendon Reflexes: Reflexes normal.  Psychiatric:        Mood and Affect: Mood normal.        Behavior: Behavior normal.        Thought Content: Thought content normal.        Judgment: Judgment normal.       Assessment & Plan:  1. Routine general medical examination at a health care facility -I am glad that she is starting to feel some improvement.  Advised her to continue to stay as active as possible and eat a heart healthy diet.  Follow-up with all of her specialists as directed.  She can follow-up with me in 1 year or sooner if needed - CBC with Differential/Platelet - Comprehensive metabolic panel - Lipid panel - TSH  2. Essential hypertension -No change in medications. - CBC with Differential/Platelet - Comprehensive metabolic panel - Lipid panel - TSH  3. Recurrent major depressive disorder, in remission (HCC) -Seems to be improving with Cymbalta.  Continue on current dose  4. Chronic pain syndrome -Continue treatment plan with scoliosis center as well as physical medicine and rehab  5. Chronic obstructive pulmonary disease, unspecified COPD type (HCC) -2 L via nasal cannula at night.  Follow-up with pulmonary  6. Atrial fibrillation with RVR (HCC) -Continue with Eliquis, Coreg, and  amiodarone and follow-up with cardiology - CBC with Differential/Platelet - Comprehensive metabolic panel - Lipid panel - TSH  7. Chronic systolic congestive heart failure (HCC) -Continue with Coreg, Entresto, and spironolactone. -Follow-up with cardiology as directed - CBC with Differential/Platelet - Comprehensive metabolic panel - Lipid panel - TSH  8. DOE (dyspnea on exertion) -Continue up with cardiology and pulmonary as directed - CBC with Differential/Platelet - Comprehensive metabolic panel - Lipid panel - TSH  9. Fibromyalgia -Continue with plan of care by physical medicine and rehab  10. Myofascial pain - Continue with plan of care by Physical Medicine and Rehab

## 2020-03-10 ENCOUNTER — Other Ambulatory Visit: Payer: Self-pay | Admitting: Family Medicine

## 2020-03-10 DIAGNOSIS — R7989 Other specified abnormal findings of blood chemistry: Secondary | ICD-10-CM

## 2020-03-15 DIAGNOSIS — M5136 Other intervertebral disc degeneration, lumbar region: Secondary | ICD-10-CM | POA: Diagnosis not present

## 2020-03-15 DIAGNOSIS — M48061 Spinal stenosis, lumbar region without neurogenic claudication: Secondary | ICD-10-CM | POA: Diagnosis not present

## 2020-03-15 DIAGNOSIS — M4185 Other forms of scoliosis, thoracolumbar region: Secondary | ICD-10-CM | POA: Diagnosis not present

## 2020-03-15 DIAGNOSIS — M4326 Fusion of spine, lumbar region: Secondary | ICD-10-CM | POA: Diagnosis not present

## 2020-03-16 ENCOUNTER — Encounter: Payer: Self-pay | Admitting: Cardiovascular Disease

## 2020-03-16 NOTE — Telephone Encounter (Signed)
error 

## 2020-03-17 ENCOUNTER — Other Ambulatory Visit: Payer: Self-pay | Admitting: Adult Health

## 2020-03-17 ENCOUNTER — Telehealth: Payer: Self-pay | Admitting: Adult Health

## 2020-03-17 MED ORDER — BENZONATATE 200 MG PO CAPS
200.0000 mg | ORAL_CAPSULE | Freq: Two times a day (BID) | ORAL | 1 refills | Status: DC | PRN
Start: 2020-03-17 — End: 2020-07-14

## 2020-03-17 NOTE — Telephone Encounter (Signed)
Pt.notified

## 2020-03-17 NOTE — Telephone Encounter (Signed)
pt is requesting medication for cough  [id number] [place], [place] - [address] --[phone] (H)

## 2020-03-17 NOTE — Telephone Encounter (Signed)
Tessalon Pearls sent in  

## 2020-03-21 ENCOUNTER — Telehealth: Payer: Self-pay | Admitting: Adult Health

## 2020-03-21 NOTE — Telephone Encounter (Signed)
Patient seen Julie Rice 03/09/2020 and called Julie Rice on the 13th to let him know that the cough is back and he sent her in medication and now she doesn't have any taste. Everything tastes the same. She has not had her COVID vaccines.   Please advise

## 2020-03-21 NOTE — Telephone Encounter (Signed)
Patient is scheduled for a telephone visit tomorrow at 11 AM

## 2020-03-22 ENCOUNTER — Other Ambulatory Visit (HOSPITAL_COMMUNITY)
Admission: RE | Admit: 2020-03-22 | Discharge: 2020-03-22 | Disposition: A | Discharge status: Home / Self Care | Payer: Medicare Other | Source: Ambulatory Visit | Attending: Adult Health | Admitting: Adult Health

## 2020-03-22 ENCOUNTER — Telehealth (INDEPENDENT_AMBULATORY_CARE_PROVIDER_SITE_OTHER): Payer: Medicare Other | Admitting: Adult Health

## 2020-03-22 ENCOUNTER — Encounter: Payer: Self-pay | Admitting: Adult Health

## 2020-03-22 ENCOUNTER — Other Ambulatory Visit: Payer: Self-pay

## 2020-03-22 VITALS — BP 132/67 | Temp 97.8°F | Wt 139.0 lb

## 2020-03-22 DIAGNOSIS — Z20822 Contact with and (suspected) exposure to covid-19: Secondary | ICD-10-CM | POA: Diagnosis not present

## 2020-03-22 DIAGNOSIS — Z01812 Encounter for preprocedural laboratory examination: Secondary | ICD-10-CM | POA: Diagnosis not present

## 2020-03-22 DIAGNOSIS — U071 COVID-19: Secondary | ICD-10-CM | POA: Diagnosis not present

## 2020-03-22 NOTE — Progress Notes (Signed)
Virtual Visit via Telephone Note  I connected with Julie Rice on 03/22/20 at 11:00 AM EDT by telephone and verified that I am speaking with the correct person using two identifiers.   I discussed the limitations, risks, security and privacy concerns of performing an evaluation and management service by telephone and the availability of in person appointments. I also discussed with the patient that there may be a patient responsible charge related to this service. The patient expressed understanding and agreed to proceed.  Location patient: home Location provider: work or home office Participants present for the call: patient, provider Patient did not have a visit in the prior 7 days to address this/these issue(s).   History of Present Illness: 84 year old female who is being evaluated today for cough and loss of taste.  She reports that on May 8 she was unable to taste her coffee in the morning.  On May 13 he had called to report that her chronic cough was back and asked if I would send something in, Jerilynn Som were sent in.  She reports since then she still continues to have no taste but her cough has improved.  She is tried eating multiple things including spicy foods and has been unable to taste them.  She has not received her Covid vaccinations yet.  She denies any other symptoms such as fevers, chills, shortness of breath as baseline, or feeling acutely ill.   Observations/Objective: Patient sounds cheerful and well on the phone. I do not appreciate any SOB. Speech and thought processing are grossly intact. Patient reported vitals:  Assessment and Plan: 1. Suspected COVID-19 virus infection -Encourage COVID-19 testing, she will go over to [employer] today.  Thankfully she has no other symptoms   Follow Up Instructions:  I did not refer this patient for an OV in the next 24 hours for this/these issue(s).  I discussed the assessment and treatment plan with the  patient. The patient was provided an opportunity to ask questions and all were answered. The patient agreed with the plan and demonstrated an understanding of the instructions.   The patient was advised to call back or seek an in-person evaluation if the symptoms worsen or if the condition fails to improve as anticipated.  I provided 12 minutes of non-face-to-face time during this encounter.   Shirline Frees, NP

## 2020-03-23 ENCOUNTER — Telehealth: Payer: Self-pay | Admitting: Adult Health

## 2020-03-23 LAB — SARS CORONAVIRUS 2 (TAT 6-24 HRS): SARS Coronavirus 2: POSITIVE — AB

## 2020-03-23 NOTE — Telephone Encounter (Signed)
Updates patient on her COVID-19 test which was positive.  And thankfully she has had minor symptoms and she is on day 12 to 14-day quarantine.  She does report that this morning when she drank her coffee she could tasted a little bit more than she could in the past.  Advised to stay quarantined for the next 2 days and to have any family members that she was around get tested.

## 2020-03-24 ENCOUNTER — Other Ambulatory Visit: Payer: Medicare Other

## 2020-03-24 ENCOUNTER — Telehealth: Payer: Self-pay | Admitting: Physician Assistant

## 2020-03-24 ENCOUNTER — Other Ambulatory Visit: Payer: Self-pay | Admitting: *Deleted

## 2020-03-24 ENCOUNTER — Ambulatory Visit: Payer: Medicare Other | Admitting: Cardiovascular Disease

## 2020-03-24 MED ORDER — ENTRESTO 97-103 MG PO TABS: 1.0000 | ORAL_TABLET | Freq: Two times a day (BID) | ORAL | 6 refills | Status: DC

## 2020-03-24 NOTE — Telephone Encounter (Signed)
Called to discuss with Julie Rice about Covid symptoms and the use of bamlanivimab/etesevimab or casirivimab/imdevimab, a monoclonal antibody infusion for those with mild to moderate Covid symptoms and at a high risk of hospitalization.     Pt does not qualify for infusion therapy as pt's symptoms first presented > 10 days prior to timing of infusion. Symptoms tier reviewed as well as criteria for ending isolation. Preventative practices reviewed. Patient verbalized understanding      Patient Active Problem List   Diagnosis Date Noted  . Chronic pain syndrome 02/16/2020  . Fibromyalgia 02/16/2020  . Myofascial pain 02/16/2020  . Rheumatoid arthritis involving multiple sites (HCC) 02/16/2020  . Pleural effusion, bilateral 09/08/2019  . Secondary cardiomyopathy (HCC)   . Elevated troponin 08/17/2019  . Chronic respiratory failure with hypoxia (HCC) 08/16/2019  . COPD (chronic obstructive pulmonary disease) (HCC) 08/16/2019  . Atrial fibrillation with RVR (HCC) 08/16/2019  . Community acquired pneumonia of left lung 08/07/2019  . Prolonged QT interval 08/07/2019  . Sepsis (HCC) 08/07/2019  . Acute on chronic respiratory failure with hypoxia (HCC) 08/07/2019  . CHF (congestive heart failure) (HCC) 07/24/2019  . Chest pain   . DOE (dyspnea on exertion)   . Asthma 04/22/2019  . Abnormal CT of the chest 02/23/2019  . Esophageal motility disorder   . Esophageal dysmotility   . Loss of weight   . Moderate protein-calorie malnutrition (HCC)   . Hematemesis 04/06/2018  . Periesophageal hiatal hernia 08/02/2016  . Dysphagia 05/10/2016  . Esophageal stricture 05/10/2016  . Epigastric pain 05/10/2016  . Generalized anxiety disorder 02/06/2012  . Major depressive disorder, recurrent (HCC) 02/05/2012  . Hypokalemia 11/09/2011  . Nausea and vomiting in adult 11/08/2011  . Dehydration 11/08/2011  . Tremors of nervous system 11/08/2011  . Hypothyroidism 05/10/2008  . Dyslipidemia  05/10/2008  . UNSPECIFIED ANEMIA 05/10/2008  . CAROTID ARTERY DISEASE 05/10/2008  . Transient cerebral ischemia 05/10/2008  . Chronic back pain 05/10/2008  . OSTEOPENIA 05/10/2008  . Dyspnea 05/10/2008  . Upper airway cough syndrome 05/10/2008  . ESOPHAGEAL STRICTURE 01/19/2008  . Essential hypertension 04/29/2007  . Hiatal hernia with gastroesophageal reflux 04/29/2007  . Osteoarthritis 04/29/2007  . SPONDYLOSIS 04/29/2007    Cline Crock PA-C

## 2020-03-25 DIAGNOSIS — J449 Chronic obstructive pulmonary disease, unspecified: Secondary | ICD-10-CM | POA: Diagnosis not present

## 2020-03-25 DIAGNOSIS — J9611 Chronic respiratory failure with hypoxia: Secondary | ICD-10-CM | POA: Diagnosis not present

## 2020-03-28 ENCOUNTER — Encounter: Payer: Self-pay | Admitting: Adult Health

## 2020-03-29 ENCOUNTER — Other Ambulatory Visit: Payer: Medicare Other

## 2020-03-29 ENCOUNTER — Telehealth: Payer: Self-pay | Admitting: Adult Health

## 2020-03-29 NOTE — Telephone Encounter (Signed)
Veronica from [employer] with [facility] [facility]  They had a provider go to the patients house today and there are some issues that she needs to discuss with you about the patient and to also let you know that the patient was sent to the ED. Suzette Battiest would like to receive a call back today please.  [facility] [phone]

## 2020-03-30 NOTE — Telephone Encounter (Signed)
Spoke to [place] and she stated that the provider done the home visit. They were concerned that she fell and hit her head and they sent her to the emergency room. However pt stated that she was dx with Covid and now they are needing the date of positive lab. Date has been given.

## 2020-04-05 ENCOUNTER — Encounter: Payer: Medicare Other | Admitting: Physical Medicine and Rehabilitation

## 2020-04-10 ENCOUNTER — Other Ambulatory Visit: Payer: Self-pay

## 2020-04-10 ENCOUNTER — Encounter: Payer: Self-pay | Admitting: Physical Medicine and Rehabilitation

## 2020-04-10 ENCOUNTER — Encounter
Payer: Medicare Other | Attending: Physical Medicine and Rehabilitation | Admitting: Physical Medicine and Rehabilitation

## 2020-04-10 ENCOUNTER — Encounter: Payer: Self-pay | Admitting: Adult Health

## 2020-04-10 VITALS — BP 122/70 | HR 67 | Temp 98.5°F | Ht 61.5 in | Wt 141.4 lb

## 2020-04-10 DIAGNOSIS — M069 Rheumatoid arthritis, unspecified: Secondary | ICD-10-CM | POA: Insufficient documentation

## 2020-04-10 DIAGNOSIS — M7918 Myalgia, other site: Secondary | ICD-10-CM | POA: Diagnosis not present

## 2020-04-10 DIAGNOSIS — Z79891 Long term (current) use of opiate analgesic: Secondary | ICD-10-CM | POA: Insufficient documentation

## 2020-04-10 DIAGNOSIS — M797 Fibromyalgia: Secondary | ICD-10-CM

## 2020-04-10 DIAGNOSIS — Z5181 Encounter for therapeutic drug level monitoring: Secondary | ICD-10-CM | POA: Insufficient documentation

## 2020-04-10 DIAGNOSIS — G894 Chronic pain syndrome: Secondary | ICD-10-CM | POA: Diagnosis not present

## 2020-04-10 MED ORDER — DULOXETINE HCL 60 MG PO CPEP
60.0000 mg | ORAL_CAPSULE | Freq: Every day | ORAL | 11 refills | Status: DC
Start: 2020-04-10 — End: 2020-05-03

## 2020-04-10 MED ORDER — TRAMADOL HCL 50 MG PO TABS: 100.0000 mg | ORAL_TABLET | Freq: Two times a day (BID) | ORAL | 5 refills | Status: DC

## 2020-04-10 NOTE — Patient Instructions (Signed)
Plan: 1. Change duloxetine to 60 mg capsule- continue 60 mg daily- for nerve pain.   2. Change Tramadol to 100 mg 2x/day for back pain.   3. Can be a little more constipating- so be careful- do more juices or grapes/prunes/kiwi's or Senokot.    4. Patient here for trigger point injections for secondary myofascial pain  Consent done and on chart.  Cleaned areas with alcohol and injected using a 27 gauge 1.5 inch needle  Injected  Using 1% Lidocaine with no EPI  Upper traps B/L Levators Posterior scalenes B/L Middle scalenes Splenius Capitus Pectoralis Major B/L=- very shallow Rhomboids b/L x2 Infraspinatus Teres Major/minor Thoracic paraspinals B/L Lumbar paraspinals b/L x3 Other injections-    Patient's level of pain prior was 8/10 this AM- sitting here- 5/10 Current level of pain after injections is 2/10  There was no bleeding or complications.  Patient was advised to drink a lot of water on day after injections to flush system Will have increased soreness for 12-48 hours after injections.  Can use Lidocaine patches the day AFTER injections Can use theracane on day of injections in places didn't inject Can use heating pad 4-6 hours AFTER injections  5. Take 1 tylenol with tramadol to make it last longer.   6. Pec/chest stretches- in corner  7. F/u in 8 weeks.

## 2020-04-10 NOTE — Progress Notes (Signed)
  Patient is an 84 yr old female with hx of . Moderate or severe neural foraminal stenosis at the left T1, right T6, and bilateral T9 through T12 nerve levels with secondary myofascial pain.   Trigger point injections- Lasted a couple of days.   First fall ever- Also fell a 1 week ago- fell and got rug burn on forehead and nose and head hit floor and bounced- knocked silly for a minute. Had to scoot to get off the floor.   Couldn't get off floor herself-  Wants to get shoulders back more.  And starts hurting really bad.   L knee still hurting-  From fall.   Taking Tramadol 1 tab 3x/day- tries to take 2x/day but usually takes 3x/day.   Was dx'd with COVID- 5/16 or 5/18- since lost sense of taste/smell.    Exam: Scab off nose- but still red on top of R side of nose and mild rug burn on forehead- right side.  TTP over distal L knee- just below L patella- not in knee joint.  Myofascial trigger points still noticeable   Plan: 1. Change duloxetine to 60 mg capsule- continue 60 mg daily- for nerve pain.   2. Change Tramadol to 100 mg 2x/day for back pain.   3. Can be a little more constipating- so be careful- do more juices or grapes/prunes/kiwi's or Senokot.    4. Patient here for trigger point injections for secondary myofascial pain  Consent done and on chart.  Cleaned areas with alcohol and injected using a 27 gauge 1.5 inch needle  Injected  Using 1% Lidocaine with no EPI  Upper traps B/L Levators Posterior scalenes B/L Middle scalenes Splenius Capitus Pectoralis Major B/L=- very shallow Rhomboids b/L x2 Infraspinatus Teres Major/minor Thoracic paraspinals B/L Lumbar paraspinals b/L x3 Other injections-    Patient's level of pain prior was 8/10 this AM- sitting here- 5/10 Current level of pain after injections is 2/10  There was no bleeding or complications.  Patient was advised to drink a lot of water on day after injections to flush system Will have  increased soreness for 12-48 hours after injections.  Can use Lidocaine patches the day AFTER injections Can use theracane on day of injections in places didn't inject Can use heating pad 4-6 hours AFTER injections  5. Take 1 tylenol with tramadol to make it last longer.   6. Pec/chest stretches- in corner  7. F/u in 8 weeks.   I spent a total of 45 minutes on appointment- as detailed above.

## 2020-04-12 ENCOUNTER — Telehealth: Payer: Self-pay

## 2020-04-12 ENCOUNTER — Encounter: Payer: Self-pay | Admitting: Adult Health

## 2020-04-12 ENCOUNTER — Other Ambulatory Visit: Payer: Self-pay

## 2020-04-12 ENCOUNTER — Ambulatory Visit (INDEPENDENT_AMBULATORY_CARE_PROVIDER_SITE_OTHER): Payer: Medicare Other | Admitting: Adult Health

## 2020-04-12 VITALS — BP 130/60 | Temp 97.0°F | Wt 141.0 lb

## 2020-04-12 DIAGNOSIS — N3000 Acute cystitis without hematuria: Secondary | ICD-10-CM | POA: Diagnosis not present

## 2020-04-12 LAB — POC URINALSYSI DIPSTICK (AUTOMATED)
Bilirubin, UA: NEGATIVE
Glucose, UA: NEGATIVE
Ketones, UA: NEGATIVE
Nitrite, UA: POSITIVE
Protein, UA: POSITIVE — AB
Spec Grav, UA: <=1.005 — AB (ref 1.010–1.025)
Urobilinogen, UA: 0.2 E.U./dL
pH, UA: >=9 — AB (ref 5.0–8.0)

## 2020-04-12 MED ORDER — CIPROFLOXACIN HCL 500 MG PO TABS: 500.0000 mg | ORAL_TABLET | Freq: Two times a day (BID) | ORAL | 0 refills | Status: AC

## 2020-04-12 NOTE — Telephone Encounter (Signed)
Called patient to make sure she was getting her PFT before her appointment with Dr. Eden Emms later this month. Patient is schedule for PFT on 6/18.

## 2020-04-12 NOTE — Progress Notes (Signed)
Subjective:    Patient ID: Julie Rice, female    DOB: 01/23/1936, 84 y.o.   MRN: [id number]  HPI 84 year old female who  has a past medical history of Anemia, Anxiety, Bipolar disorder (HCC), Blood transfusion (1991), CAD (coronary artery disease), Cataract, Chest pain, Chronic back pain, COPD (chronic obstructive pulmonary disease) (HCC), Depression, Esophageal stricture, Fibromyalgia, GERD (gastroesophageal reflux disease), Heart murmur, Hiatal hernia, Hyperlipidemia, Hypertension, Internal hemorrhoids, Lumbago, Mitral regurgitation, Osteoarthritis, Osteoporosis, Pneumonia (~ 01/2018), PONV (postoperative nausea and vomiting), Rectal bleeding, Rheumatoid arthritis (HCC), Spondylosis, TIA (transient ischemic attack) (2013), and Urinary incontinence.   She presents to the office today for concern of UTI. Her symptoms include that of odorous cloudy urine   Her symptoms have been present for about 3-4 weeks.    Review of Systems See HPI   Past Medical History:  Diagnosis Date  . Anemia   . Anxiety   . Bipolar disorder (HCC)   . Blood transfusion 1991   autologous pts own blood given   . CAD (coronary artery disease)   . Cataract   . Chest pain    "@ rest, lying down, w/exertion"  . Chronic back pain    "mostly lower back but I do have upper back pain regularly" (04/06/2018)  . COPD (chronic obstructive pulmonary disease) (HCC)   . Depression   . Esophageal stricture   . Fibromyalgia   . GERD (gastroesophageal reflux disease)   . Heart murmur    "slight" (04/06/2018)  . Hiatal hernia   . Hyperlipidemia    Patient denies  . Hypertension   . Internal hemorrhoids   . Lumbago   . Mitral regurgitation   . Osteoarthritis   . Osteoporosis   . Pneumonia ~ 01/2018  . PONV (postoperative nausea and vomiting)    severe ponv, "in the past" (04/06/2018)  . Rectal bleeding   . Rheumatoid arthritis (HCC)   . Spondylosis   . TIA (transient ischemic attack) 2013  . Urinary incontinence     wears depends    Social History   Socioeconomic History  . Marital status: Widowed    Spouse name: Not on file  . Number of children: 2  . Years of education: Not on file  . Highest education level: Not on file  Occupational History    Employer: RETIRED  Tobacco Use  . Smoking status: Never Smoker  . Smokeless tobacco: Never Used  Substance and Sexual Activity  . Alcohol use: Never  . Drug use: Never  . Sexual activity: Not Currently  Other Topics Concern  . Not on file  Social History Narrative   Retired    Widowed   Social Determinants of Corporate investment banker Strain:   . Difficulty of Paying Living Expenses:   Food Insecurity:   . Worried About Programme researcher, broadcasting/film/video in the Last Year:   . Barista in the Last Year:   Transportation Needs:   . Freight forwarder (Medical):   Marland Kitchen Lack of Transportation (Non-Medical):   Physical Activity:   . Days of Exercise per Week:   . Minutes of Exercise per Session:   Stress:   . Feeling of Stress :   Social Connections:   . Frequency of Communication with Friends and Family:   . Frequency of Social Gatherings with Friends and Family:   . Attends Religious Services:   . Active Member of Clubs or Organizations:   . Attends Banker  Meetings:   Marland Kitchen Marital Status:   Intimate Partner Violence:   . Fear of Current or Ex-Partner:   . Emotionally Abused:   Marland Kitchen Physically Abused:   . Sexually Abused:     Past Surgical History:  Procedure Laterality Date  . ABDOMINAL HYSTERECTOMY  1982  . BACK SURGERY    . BALLOON DILATION N/A 09/21/2018   Procedure: BALLOON DILATION;  Surgeon: Hilarie Fredrickson, MD;  Location: [facility] ENDOSCOPY;  Service: Endoscopy;  Laterality: N/A;  . BALLOON DILATION N/A 08/12/2019   Procedure: BALLOON DILATION;  Surgeon: Lynann Bologna, MD;  Location: [facility] ENDOSCOPY;  Service: Endoscopy;  Laterality: N/A;  . BIOPSY  08/12/2019   Procedure: BIOPSY;  Surgeon: Lynann Bologna, MD;  Location: [facility]  ENDOSCOPY;  Service: Endoscopy;;  . BLADDER SUSPENSION  1980's  . BOTOX INJECTION N/A 09/21/2018   Procedure: BOTOX INJECTION;  Surgeon: Hilarie Fredrickson, MD;  Location: [facility] ENDOSCOPY;  Service: Endoscopy;  Laterality: N/A;  . BOTOX INJECTION N/A 08/12/2019   Procedure: BOTOX INJECTION;  Surgeon: Lynann Bologna, MD;  Location: [facility] ENDOSCOPY;  Service: Endoscopy;  Laterality: N/A;  . CATARACT EXTRACTION W/ INTRAOCULAR LENS  IMPLANT, BILATERAL Bilateral 2010  . DILATION AND CURETTAGE OF UTERUS  1961  . ESOPHAGEAL MANOMETRY N/A 03/25/2018   Procedure: ESOPHAGEAL MANOMETRY (EM);  Surgeon: Napoleon Form, MD;  Location: [facility] ENDOSCOPY;  Service: Endoscopy;  Laterality: N/A;  . ESOPHAGOGASTRODUODENOSCOPY (EGD) WITH PROPOFOL N/A 04/07/2018   Procedure: ESOPHAGOGASTRODUODENOSCOPY (EGD) WITH PROPOFOL;  Surgeon: Sherrilyn Rist, MD;  Location: [facility] ENDOSCOPY;  Service: Gastroenterology;  Laterality: N/A;  . ESOPHAGOGASTRODUODENOSCOPY (EGD) WITH PROPOFOL N/A 09/21/2018   Procedure: ESOPHAGOGASTRODUODENOSCOPY (EGD) WITH PROPOFOL;  Surgeon: Hilarie Fredrickson, MD;  Location: [facility] ENDOSCOPY;  Service: Endoscopy;  Laterality: N/A;  . ESOPHAGOGASTRODUODENOSCOPY (EGD) WITH PROPOFOL N/A 08/12/2019   Procedure: ESOPHAGOGASTRODUODENOSCOPY (EGD) WITH PROPOFOL;  Surgeon: Lynann Bologna, MD;  Location: [facility] ENDOSCOPY;  Service: Endoscopy;  Laterality: N/A;  . HERNIA REPAIR    . HIATAL HERNIA REPAIR N/A 08/02/2016   Procedure: LAPAROSCOPIC REPAIR OF LARGE  HIATAL HERNIA;  Surgeon: Glenna Fellows, MD;  Location: [facility] ORS;  Service: General;  Laterality: N/A;  . JOINT REPLACEMENT    . LAPAROSCOPIC NISSEN FUNDOPLICATION N/A 08/02/2016   Procedure: LAPAROSCOPIC NISSEN FUNDOPLICATION;  Surgeon: Glenna Fellows, MD;  Location: [facility] ORS;  Service: General;  Laterality: N/A;  . LUMBAR LAMINECTOMY  1990; 1994; [id number]   "I've got 2 stainless steel rods; 6 screws; 2 ray cages"took bone from right hip to put in back  . RIGHT/LEFT HEART CATH  AND CORONARY ANGIOGRAPHY N/A 07/26/2019   Procedure: RIGHT/LEFT HEART CATH AND CORONARY ANGIOGRAPHY;  Surgeon: Lyn Records, MD;  Location: [facility] INVASIVE CV LAB;  Service: Cardiovascular;  Laterality: N/A;  . TEE WITHOUT CARDIOVERSION N/A 08/19/2019   Procedure: TRANSESOPHAGEAL ECHOCARDIOGRAM (TEE);  Surgeon: Lewayne Bunting, MD;  Location: [facility] ENDOSCOPY;  Service: Cardiovascular;  Laterality: N/A;  . TONSILLECTOMY AND ADENOIDECTOMY  1945  . TOTAL KNEE ARTHROPLASTY Right ~ 1996  . TUBAL LIGATION  ~ 1976    Family History  Problem Relation Age of Onset  . Kidney disease Mother   . Hypertension Mother   . Heart disease Father 11       die of MI at age 19  . Suicidality Son   . Heart disease Brother   . Heart attack Brother   . Colon cancer Neg Hx   . Esophageal cancer Neg Hx   . Pancreatic cancer Neg Hx   .  Rectal cancer Neg Hx   . Stomach cancer Neg Hx     Allergies  Allergen Reactions  . Morphine Other (See Comments)    Flushing, rash, itching  . Tape Other (See Comments)    Band aides, adhesive tape Redness and pulls skin off    Current Outpatient Medications on File Prior to Visit  Medication Sig Dispense Refill  . albuterol (VENTOLIN HFA) 108 (90 Base) MCG/ACT inhaler Inhale 2 puffs into the lungs every 6 (six) hours as needed for wheezing or shortness of breath. 1 Inhaler 6  . amiodarone (PACERONE) 200 MG tablet Take 1 tablet (200 mg total) by mouth daily. 90 tablet 3  . amLODipine (NORVASC) 2.5 MG tablet TAKE 1 TABLET BY MOUTH EVERY DAY 90 tablet 0  . apixaban (ELIQUIS) 5 MG TABS tablet Take 1 tablet (5 mg total) by mouth 2 (two) times daily. 180 tablet 3  . benzonatate (TESSALON) 200 MG capsule Take 1 capsule (200 mg total) by mouth 2 (two) times daily as needed for cough. 20 capsule 1  . carvedilol (COREG) 6.25 MG tablet Take 1 tablet (6.25 mg total) by mouth 2 (two) times daily with a meal. 180 tablet 3  . clonazePAM (KLONOPIN) 0.5 MG tablet TAKE 1 TABLET BY MOUTH  TWICE A DAY AS NEEDED FOR ANXIETY 60 tablet 1  . DULoxetine (CYMBALTA) 60 MG capsule Take 1 capsule (60 mg total) by mouth daily. 30 capsule 11  . famotidine (PEPCID) 20 MG tablet One after supper 30 tablet 11  . furosemide (LASIX) 40 MG tablet Take 1 tablet (40 mg total) by mouth daily. Take 1 tablet by mouth once a day. You may take an extra tablet as needed at 2 PM for increased swelling 180 tablet 3  . gabapentin (NEURONTIN) 100 MG capsule TAKE 1 CAPSULE BY MOUTH THREE TIMES A DAY 270 capsule 1  . omeprazole (PRILOSEC) 20 MG capsule Take 1 capsule (20 mg total) by mouth daily. 90 capsule 3  . sacubitril-valsartan (ENTRESTO) 97-103 MG Take 1 tablet by mouth 2 (two) times daily. 60 tablet 6  . spironolactone (ALDACTONE) 25 MG tablet Take 1 tablet (25 mg total) by mouth daily. 90 tablet 3  . traMADol (ULTRAM) 50 MG tablet Take 2 tablets (100 mg total) by mouth 2 (two) times daily. 120 tablet 5   No current facility-administered medications on file prior to visit.    BP 130/60   Temp (!) 97 F (36.1 C)   Wt 141 lb (64 kg)   BMI 26.21 kg/m       Objective:   Physical Exam Vitals and nursing note reviewed.  Constitutional:      Appearance: Normal appearance.  Abdominal:     General: Abdomen is flat.     Palpations: Abdomen is soft.     Tenderness: There is no right CVA tenderness or left CVA tenderness.  Skin:    General: Skin is warm and dry.  Neurological:     General: No focal deficit present.     Mental Status: She is alert.  Psychiatric:        Mood and Affect: Mood normal.        Behavior: Behavior normal.        Thought Content: Thought content normal.        Judgment: Judgment normal.        Assessment & Plan:  1. Acute cystitis without hematuria  - POCT Urinalysis Dipstick (Automated) + leuks, Nit and blood. Urine cloudy  with mucus  - Culture, Urine - ciprofloxacin (CIPRO) 500 MG tablet; Take 1 tablet (500 mg total) by mouth 2 (two) times daily for 5 days.   Dispense: 10 tablet; Refill: 0 - Drink plenty of water   Shirline Frees, NP

## 2020-04-14 LAB — URINE CULTURE
MICRO NUMBER:: 10571302
SPECIMEN QUALITY:: ADEQUATE

## 2020-04-18 ENCOUNTER — Inpatient Hospital Stay (HOSPITAL_COMMUNITY): Admission: RE | Admit: 2020-04-18 | Payer: Medicare Other | Source: Ambulatory Visit

## 2020-04-18 ENCOUNTER — Other Ambulatory Visit: Payer: Self-pay

## 2020-04-18 ENCOUNTER — Ambulatory Visit: Payer: Medicare Other | Admitting: Internal Medicine

## 2020-04-18 MED ORDER — FUROSEMIDE 40 MG PO TABS: 40.0000 mg | ORAL_TABLET | Freq: Every day | ORAL | 3 refills | Status: DC | PRN

## 2020-04-19 ENCOUNTER — Ambulatory Visit: Payer: Medicare Other

## 2020-04-19 NOTE — Progress Notes (Deleted)
Patient does not need to be retested for COVID before PFT she was positive on 03/22/20

## 2020-04-20 ENCOUNTER — Telehealth: Payer: Self-pay | Admitting: Cardiovascular Disease

## 2020-04-20 NOTE — Telephone Encounter (Signed)
Patient's covid test was rescheduled for 8/11 and PFTs on 8/16. Patient states that she is doing well. Appointment with Dr. Eden Emms rescheduled for 9/10. Patient will let us know if her Sx change or worsen before then.

## 2020-04-20 NOTE — Telephone Encounter (Signed)
Patient is calling about the message she sent via MyChart.

## 2020-04-20 NOTE — Telephone Encounter (Signed)
Patient's covid test was rescheduled for 8/11 and PFTs on 8/16. Patient states that she is doing well. Appointment with Dr. Nishan rescheduled for 9/10. Patient will let us know if her Sx change or worsen before then.  

## 2020-04-23 ENCOUNTER — Other Ambulatory Visit: Payer: Self-pay | Admitting: Adult Health

## 2020-04-24 ENCOUNTER — Ambulatory Visit: Payer: Medicare Other | Admitting: Internal Medicine

## 2020-04-25 DIAGNOSIS — J9611 Chronic respiratory failure with hypoxia: Secondary | ICD-10-CM | POA: Diagnosis not present

## 2020-04-25 DIAGNOSIS — J449 Chronic obstructive pulmonary disease, unspecified: Secondary | ICD-10-CM | POA: Diagnosis not present

## 2020-04-25 NOTE — Telephone Encounter (Signed)
SENT TO THE PHARMACY BY E-SCRIBE. 

## 2020-04-26 ENCOUNTER — Ambulatory Visit: Payer: Medicare Other | Admitting: Cardiovascular Disease

## 2020-04-27 ENCOUNTER — Other Ambulatory Visit: Payer: Self-pay | Admitting: Adult Health

## 2020-04-27 DIAGNOSIS — M1712 Unilateral primary osteoarthritis, left knee: Secondary | ICD-10-CM | POA: Diagnosis not present

## 2020-04-27 DIAGNOSIS — M25562 Pain in left knee: Secondary | ICD-10-CM | POA: Diagnosis not present

## 2020-04-30 ENCOUNTER — Other Ambulatory Visit: Payer: Self-pay | Admitting: Adult Health

## 2020-04-30 DIAGNOSIS — Z760 Encounter for issue of repeat prescription: Secondary | ICD-10-CM

## 2020-05-01 ENCOUNTER — Encounter: Payer: Self-pay | Admitting: Adult Health

## 2020-05-02 ENCOUNTER — Other Ambulatory Visit: Payer: Self-pay | Admitting: Adult Health

## 2020-05-02 DIAGNOSIS — Z760 Encounter for issue of repeat prescription: Secondary | ICD-10-CM

## 2020-05-02 MED ORDER — CLONAZEPAM 0.5 MG PO TABS: ORAL_TABLET | ORAL | 1 refills | Status: DC

## 2020-05-03 ENCOUNTER — Other Ambulatory Visit: Payer: Self-pay | Admitting: Physical Medicine and Rehabilitation

## 2020-05-06 ENCOUNTER — Other Ambulatory Visit: Payer: Self-pay | Admitting: Adult Health

## 2020-05-25 DIAGNOSIS — J449 Chronic obstructive pulmonary disease, unspecified: Secondary | ICD-10-CM | POA: Diagnosis not present

## 2020-05-25 DIAGNOSIS — J9611 Chronic respiratory failure with hypoxia: Secondary | ICD-10-CM | POA: Diagnosis not present

## 2020-05-29 ENCOUNTER — Other Ambulatory Visit: Payer: Self-pay | Admitting: Physical Medicine and Rehabilitation

## 2020-05-29 ENCOUNTER — Other Ambulatory Visit: Payer: Self-pay | Admitting: Adult Health

## 2020-06-04 ENCOUNTER — Encounter: Payer: Self-pay | Admitting: Adult Health

## 2020-06-05 ENCOUNTER — Encounter: Payer: Self-pay | Admitting: *Deleted

## 2020-06-12 ENCOUNTER — Encounter: Payer: Medicare Other | Admitting: Physical Medicine and Rehabilitation

## 2020-06-14 ENCOUNTER — Other Ambulatory Visit: Payer: Medicare Other

## 2020-06-14 ENCOUNTER — Encounter: Payer: Medicare Other | Admitting: Physical Medicine and Rehabilitation

## 2020-06-19 ENCOUNTER — Ambulatory Visit: Payer: Medicare Other | Admitting: Internal Medicine

## 2020-06-25 DIAGNOSIS — J9611 Chronic respiratory failure with hypoxia: Secondary | ICD-10-CM | POA: Diagnosis not present

## 2020-06-25 DIAGNOSIS — J449 Chronic obstructive pulmonary disease, unspecified: Secondary | ICD-10-CM | POA: Diagnosis not present

## 2020-06-26 ENCOUNTER — Encounter: Payer: Self-pay | Admitting: Physical Medicine and Rehabilitation

## 2020-06-26 ENCOUNTER — Other Ambulatory Visit: Payer: Self-pay | Admitting: Adult Health

## 2020-06-26 ENCOUNTER — Encounter
Payer: Medicare Other | Attending: Physical Medicine and Rehabilitation | Admitting: Physical Medicine and Rehabilitation

## 2020-06-26 ENCOUNTER — Other Ambulatory Visit: Payer: Self-pay

## 2020-06-26 ENCOUNTER — Encounter: Payer: Self-pay | Admitting: Adult Health

## 2020-06-26 VITALS — BP 117/70 | HR 66 | Temp 98.8°F | Ht 61.5 in | Wt 142.2 lb

## 2020-06-26 DIAGNOSIS — Z79891 Long term (current) use of opiate analgesic: Secondary | ICD-10-CM | POA: Diagnosis not present

## 2020-06-26 DIAGNOSIS — M797 Fibromyalgia: Secondary | ICD-10-CM | POA: Insufficient documentation

## 2020-06-26 DIAGNOSIS — M7918 Myalgia, other site: Secondary | ICD-10-CM

## 2020-06-26 DIAGNOSIS — Z760 Encounter for issue of repeat prescription: Secondary | ICD-10-CM

## 2020-06-26 DIAGNOSIS — G894 Chronic pain syndrome: Secondary | ICD-10-CM | POA: Insufficient documentation

## 2020-06-26 DIAGNOSIS — M069 Rheumatoid arthritis, unspecified: Secondary | ICD-10-CM | POA: Diagnosis not present

## 2020-06-26 DIAGNOSIS — Z5181 Encounter for therapeutic drug level monitoring: Secondary | ICD-10-CM | POA: Diagnosis not present

## 2020-06-26 DIAGNOSIS — N939 Abnormal uterine and vaginal bleeding, unspecified: Secondary | ICD-10-CM

## 2020-06-26 MED ORDER — HYDROCODONE-ACETAMINOPHEN 5-325 MG PO TABS
1.0000 | ORAL_TABLET | Freq: Three times a day (TID) | ORAL | 0 refills | Status: DC | PRN
Start: 2020-06-26 — End: 2020-12-01

## 2020-06-26 NOTE — Progress Notes (Signed)
Patient is an 84 yr old female with hx of . Moderate or severe neural foraminal stenosis at the left T1, right T6, and bilateral T9 through T12 nerve levels with secondary myofascial pain. Also with Afib, on Amiodarone/ and Eliquis.    Whole body hurting today- back, feet, etc. That's why cancelled last time.    Trigger point injections- after 2 days, feels great- then ~ lasted 2 weeks then started getting worse every day for up to 1 month.      Last appointment missed due to increased pain.   Original Rx, wrote Tramadol 100 mg BID Takes 2 pills-  Now taking 2 pills 2x/day usually.   Just changed at last visit to 100 mg BID-   Fallen 3x/since last here.  1. August 1st- vaginal bleeding day afterwards- for 3 days.  Then had some spotting 2. 2 weeks ago- slid off toilet- between commode and bathtub- when doing lotion of legs- stuck for awhile-  3. Third one was her "first fall".  Went down on butt- pulling box for [employer].    Also had ~2 weeks after last fall, blew nose and got out large blood clot- "pure dark blood".   On Apixiban- Eliquis- so can cause increased bleeding.   Feels like leg pain and back pain- "deep pain" that  Feels deep to the bone- doesn't c/o burning, tingling,   Has been on Gabapentin 100 mg BID  Plan:  1. Needs to see Gynecologist ASAP due to vaginal bleeding. Needs to find Gynecologist.   2. Has tramadol 100 mg 2x/day for chronic pain.    3. Likes the trigger point injections- lets her get a lot done for 2+ weeks.   4. Is on Eliquis, so would be difficult to do Epidural steroid injection.  5.  Can't use NSAIDs -anti-inflammatories due to Eliquis.   6. Patient here for trigger point injections for  Consent done and on chart.  Cleaned areas with alcohol and injected using a 27 gauge 1.5 inch needle  Injected  Using 1% Lidocaine with no EPI  Upper traps B/L Levators B/L Posterior scalenes B/L Middle scalenes Splenius  Capitus Pectoralis Major Rhomboids B/L x2 Infraspinatus Teres Major/minor Thoracic paraspinals b/L Lumbar paraspinals B/L 3 Other injections-    Patient's level of pain prior was- 7/10 Current level of pain after injections is- 6/10-   There was no bleeding or complications.  Patient was advised to drink a lot of water on day after injections to flush system Will have increased soreness for 12-48 hours after injections.  Can use Lidocaine patches the day AFTER injections Can use theracane on day of injections in places didn't inject Can use heating pad 4-6 hours AFTER injections  7. Hydrocodone/Norco 5/325 mg 3x/day as needed for bad days. - #20 with no RFs   8. F/U in 6 weeks.  For trigger point injections  9. Urine drug screen. Today it's due per clinic policy.   I spent a total of 35 minutes on visit- as detailed above.

## 2020-06-26 NOTE — Patient Instructions (Signed)
  1. Needs to see Gynecologist ASAP due to vaginal bleeding. Needs to find Gynecologist.   2. Has tramadol 100 mg 2x/day for chronic pain.    3. Likes the trigger point injections- lets her get a lot done for 2+ weeks.   4. Is on Eliquis, so would be difficult to do Epidural steroid injection.  5.  Can't use NSAIDs -anti-inflammatories due to Eliquis.   6. Patient here for trigger point injections for  Consent done and on chart.  Cleaned areas with alcohol and injected using a 27 gauge 1.5 inch needle  Injected  Using 1% Lidocaine with no EPI  Upper traps B/L Levators B/L Posterior scalenes B/L Middle scalenes Splenius Capitus Pectoralis Major Rhomboids B/L x2 Infraspinatus Teres Major/minor Thoracic paraspinals b/L Lumbar paraspinals B/L 3 Other injections-    Patient's level of pain prior was- 7/10 Current level of pain after injections is- 6/10-   There was no bleeding or complications.  Patient was advised to drink a lot of water on day after injections to flush system Will have increased soreness for 12-48 hours after injections.  Can use Lidocaine patches the day AFTER injections Can use theracane on day of injections in places didn't inject Can use heating pad 4-6 hours AFTER injections  7. Hydrocodone/Norco 5/325 mg 3x/day as needed for bad days. - #20 with no RFs   8. F/U in 6 weeks.  For trigger point injections  9. Urine drug screen. Today it's due per clinic policy.

## 2020-06-29 ENCOUNTER — Ambulatory Visit: Payer: Medicare Other | Admitting: Obstetrics and Gynecology

## 2020-06-30 ENCOUNTER — Other Ambulatory Visit: Payer: Self-pay

## 2020-06-30 ENCOUNTER — Emergency Department (HOSPITAL_COMMUNITY)
Admission: EM | Admit: 2020-06-30 | Discharge: 2020-06-30 | Disposition: A | Discharge status: Home / Self Care | Payer: Medicare Other | Attending: Emergency Medicine | Admitting: Emergency Medicine

## 2020-06-30 ENCOUNTER — Encounter (HOSPITAL_COMMUNITY): Payer: Self-pay

## 2020-06-30 ENCOUNTER — Emergency Department (HOSPITAL_COMMUNITY): Payer: Medicare Other

## 2020-06-30 DIAGNOSIS — Z7901 Long term (current) use of anticoagulants: Secondary | ICD-10-CM | POA: Insufficient documentation

## 2020-06-30 DIAGNOSIS — J449 Chronic obstructive pulmonary disease, unspecified: Secondary | ICD-10-CM | POA: Diagnosis not present

## 2020-06-30 DIAGNOSIS — F13239 Sedative, hypnotic or anxiolytic dependence with withdrawal, unspecified: Secondary | ICD-10-CM | POA: Diagnosis not present

## 2020-06-30 DIAGNOSIS — E039 Hypothyroidism, unspecified: Secondary | ICD-10-CM | POA: Insufficient documentation

## 2020-06-30 DIAGNOSIS — J189 Pneumonia, unspecified organism: Secondary | ICD-10-CM | POA: Diagnosis not present

## 2020-06-30 DIAGNOSIS — J45909 Unspecified asthma, uncomplicated: Secondary | ICD-10-CM | POA: Diagnosis not present

## 2020-06-30 DIAGNOSIS — Z9861 Coronary angioplasty status: Secondary | ICD-10-CM | POA: Diagnosis not present

## 2020-06-30 DIAGNOSIS — I2510 Atherosclerotic heart disease of native coronary artery without angina pectoris: Secondary | ICD-10-CM | POA: Diagnosis not present

## 2020-06-30 DIAGNOSIS — F13930 Sedative, hypnotic or anxiolytic use, unspecified with withdrawal, uncomplicated: Secondary | ICD-10-CM

## 2020-06-30 DIAGNOSIS — I509 Heart failure, unspecified: Secondary | ICD-10-CM | POA: Insufficient documentation

## 2020-06-30 DIAGNOSIS — I110 Hypertensive heart disease with heart failure: Secondary | ICD-10-CM | POA: Insufficient documentation

## 2020-06-30 DIAGNOSIS — Z79899 Other long term (current) drug therapy: Secondary | ICD-10-CM | POA: Diagnosis not present

## 2020-06-30 DIAGNOSIS — R079 Chest pain, unspecified: Secondary | ICD-10-CM | POA: Diagnosis not present

## 2020-06-30 DIAGNOSIS — Z7951 Long term (current) use of inhaled steroids: Secondary | ICD-10-CM | POA: Insufficient documentation

## 2020-06-30 DIAGNOSIS — F19230 Other psychoactive substance dependence with withdrawal, uncomplicated: Secondary | ICD-10-CM | POA: Diagnosis not present

## 2020-06-30 DIAGNOSIS — F13230 Sedative, hypnotic or anxiolytic dependence with withdrawal, uncomplicated: Secondary | ICD-10-CM

## 2020-06-30 DIAGNOSIS — Z209 Contact with and (suspected) exposure to unspecified communicable disease: Secondary | ICD-10-CM | POA: Diagnosis not present

## 2020-06-30 DIAGNOSIS — Z96651 Presence of right artificial knee joint: Secondary | ICD-10-CM | POA: Diagnosis not present

## 2020-06-30 DIAGNOSIS — J181 Lobar pneumonia, unspecified organism: Secondary | ICD-10-CM | POA: Diagnosis not present

## 2020-06-30 DIAGNOSIS — R0789 Other chest pain: Secondary | ICD-10-CM | POA: Diagnosis not present

## 2020-06-30 DIAGNOSIS — I447 Left bundle-branch block, unspecified: Secondary | ICD-10-CM | POA: Diagnosis not present

## 2020-06-30 DIAGNOSIS — R0602 Shortness of breath: Secondary | ICD-10-CM | POA: Diagnosis not present

## 2020-06-30 DIAGNOSIS — Z760 Encounter for issue of repeat prescription: Secondary | ICD-10-CM

## 2020-06-30 LAB — BASIC METABOLIC PANEL
Anion gap: 13 (ref 5–15)
BUN: 13 mg/dL (ref 8–23)
CO2: 24 mmol/L (ref 22–32)
Calcium: 9.8 mg/dL (ref 8.9–10.3)
Chloride: 101 mmol/L (ref 98–111)
Creatinine, Ser: 0.83 mg/dL (ref 0.44–1.00)
GFR calc Af Amer: >60 mL/min (ref >60)
GFR calc non Af Amer: >60 mL/min (ref >60)
Glucose, Bld: 103 mg/dL — ABNORMAL HIGH (ref 70–99)
Potassium: 4.5 mmol/L (ref 3.5–5.1)
Sodium: 138 mmol/L (ref 135–145)

## 2020-06-30 LAB — CBC
HCT: 43.1 % (ref 36.0–46.0)
Hemoglobin: 13.8 g/dL (ref 12.0–15.0)
MCH: 30.9 pg (ref 26.0–34.0)
MCHC: 32 g/dL (ref 30.0–36.0)
MCV: 96.6 fL (ref 80.0–100.0)
Platelets: 290 10*3/uL (ref 150–400)
RBC: 4.46 MIL/uL (ref 3.87–5.11)
RDW: 12.8 % (ref 11.5–15.5)
WBC: 10 10*3/uL (ref 4.0–10.5)
nRBC: 0 % (ref 0.0–0.2)

## 2020-06-30 LAB — TROPONIN I (HIGH SENSITIVITY)
Troponin I (High Sensitivity): 6 ng/L (ref <18)
Troponin I (High Sensitivity): 7 ng/L (ref <18)

## 2020-06-30 LAB — BRAIN NATRIURETIC PEPTIDE: B Natriuretic Peptide: 359.6 pg/mL — ABNORMAL HIGH (ref 0.0–100.0)

## 2020-06-30 MED ORDER — AMOXICILLIN-POT CLAVULANATE 875-125 MG PO TABS: 1.0000 | ORAL_TABLET | Freq: Two times a day (BID) | ORAL | 0 refills | Status: DC

## 2020-06-30 MED ORDER — SODIUM CHLORIDE 0.9 % IV SOLN
100.0000 mg | Freq: Once | INTRAVENOUS | Status: DC
  Administered 2020-06-30: 100 mg via INTRAVENOUS
  Filled 2020-06-30: qty 100

## 2020-06-30 MED ORDER — LORAZEPAM 2 MG/ML IJ SOLN: 2.0000 mg | Freq: Once | INTRAMUSCULAR | Status: DC

## 2020-06-30 MED ORDER — DOXYCYCLINE HYCLATE 100 MG PO TABS
100.0000 mg | ORAL_TABLET | Freq: Once | ORAL | Status: AC
  Administered 2020-06-30: 100 mg via ORAL
  Filled 2020-06-30: qty 1

## 2020-06-30 MED ORDER — CARVEDILOL 3.125 MG PO TABS
6.2500 mg | ORAL_TABLET | Freq: Two times a day (BID) | ORAL | Status: DC
  Administered 2020-06-30: 6.25 mg via ORAL
  Filled 2020-06-30: qty 2

## 2020-06-30 MED ORDER — CLONAZEPAM 0.5 MG PO TABS: 0.5000 mg | ORAL_TABLET | Freq: Two times a day (BID) | ORAL | 0 refills | Status: DC | PRN

## 2020-06-30 MED ORDER — DULOXETINE HCL 60 MG PO CPEP: 60.0000 mg | ORAL_CAPSULE | Freq: Every day | ORAL | Status: DC

## 2020-06-30 MED ORDER — AMIODARONE HCL 200 MG PO TABS
200.0000 mg | ORAL_TABLET | Freq: Every day | ORAL | Status: DC
  Administered 2020-06-30: 200 mg via ORAL
  Filled 2020-06-30: qty 1

## 2020-06-30 MED ORDER — AMLODIPINE BESYLATE 5 MG PO TABS
2.5000 mg | ORAL_TABLET | Freq: Every day | ORAL | Status: DC
  Administered 2020-06-30: 2.5 mg via ORAL
  Filled 2020-06-30: qty 1

## 2020-06-30 MED ORDER — LORAZEPAM 2 MG/ML IJ SOLN
1.0000 mg | Freq: Once | INTRAMUSCULAR | Status: AC
  Administered 2020-06-30: 1 mg via INTRAVENOUS
  Filled 2020-06-30: qty 1

## 2020-06-30 MED ORDER — GABAPENTIN 100 MG PO CAPS
100.0000 mg | ORAL_CAPSULE | Freq: Three times a day (TID) | ORAL | Status: DC
  Administered 2020-06-30: 100 mg via ORAL
  Filled 2020-06-30: qty 1

## 2020-06-30 MED ORDER — APIXABAN 5 MG PO TABS: 5.0000 mg | ORAL_TABLET | Freq: Two times a day (BID) | ORAL | Status: DC

## 2020-06-30 MED ORDER — SODIUM CHLORIDE 0.9 % IV SOLN
1.0000 g | Freq: Once | INTRAVENOUS | Status: AC
  Administered 2020-06-30: 1 g via INTRAVENOUS
  Filled 2020-06-30: qty 10

## 2020-06-30 NOTE — ED Triage Notes (Addendum)
Pt BIB [facility] with CP, cough and SOB x3 days. Hx of COPD. Normally wears 2L [place] at night and PRN, sats 90% on 2L put on 4L sats 99% lungs clear. CP 0/10 324 mg ASA 20G RAC   CBG 125 BP 150/88 HR 76 RR 20-24 96.8 temp

## 2020-06-30 NOTE — ED Provider Notes (Signed)
[facility] EMERGENCY DEPARTMENT Provider Note   CSN: [id number] Arrival date & time: 06/30/20  1022     History Chief Complaint  Patient presents with  . Shortness of Breath    Julie Rice is a 84 y.o. female hx COPD on baseline 2L O2, CHF, chronic pain, presenting by EMS for chest discomfort, SOB starting last night endorses burning across anterior chest, radiating down BL arms. Worse with exertion, relieved by rest. Discomfort improving this am but SOB, weakness worsening. EMS was called increasing O2 to 4L for oxygen saturation 90%.  Patient states that since arrival to the ED her symptoms have completely resolved and she feels "better than usual" at this time.  The history is provided by the patient.  Illness Quality:  Chest discomfort, shortness of breath Severity:  Moderate Onset quality:  Gradual Duration:  2 days Timing:  Constant Progression:  Worsening Chronicity:  New Context:  Known CHF, COPD, recurrent pneumonias Associated symptoms: fatigue and shortness of breath   Associated symptoms: no abdominal pain, no chest pain, no cough, no fever, no headaches, no rash and no vomiting        Past Medical History:  Diagnosis Date  . Anemia   . Anxiety   . Bipolar disorder (HCC)   . Blood transfusion 1991   autologous pts own blood given   . CAD (coronary artery disease)   . Cataract   . Chest pain    "@ rest, lying down, w/exertion"  . Chronic back pain    "mostly lower back but I do have upper back pain regularly" (04/06/2018)  . COPD (chronic obstructive pulmonary disease) (HCC)   . Depression   . Esophageal stricture   . Fibromyalgia   . GERD (gastroesophageal reflux disease)   . Heart murmur    "slight" (04/06/2018)  . Hiatal hernia   . Hyperlipidemia    Patient denies  . Hypertension   . Internal hemorrhoids   . Lumbago   . Mitral regurgitation   . Osteoarthritis   . Osteoporosis   . Pneumonia ~ 01/2018  . PONV (postoperative  nausea and vomiting)    severe ponv, "in the past" (04/06/2018)  . Rectal bleeding   . Rheumatoid arthritis (HCC)   . Spondylosis   . TIA (transient ischemic attack) 2013  . Urinary incontinence    wears depends    Patient Active Problem List   Diagnosis Date Noted  . Chronic pain syndrome 02/16/2020  . Fibromyalgia 02/16/2020  . Myofascial pain 02/16/2020  . Rheumatoid arthritis involving multiple sites (HCC) 02/16/2020  . Pleural effusion, bilateral 09/08/2019  . Secondary cardiomyopathy (HCC)   . Elevated troponin 08/17/2019  . Chronic respiratory failure with hypoxia (HCC) 08/16/2019  . COPD (chronic obstructive pulmonary disease) (HCC) 08/16/2019  . Atrial fibrillation with RVR (HCC) 08/16/2019  . Community acquired pneumonia of left lung 08/07/2019  . Prolonged QT interval 08/07/2019  . Sepsis (HCC) 08/07/2019  . Acute on chronic respiratory failure with hypoxia (HCC) 08/07/2019  . CHF (congestive heart failure) (HCC) 07/24/2019  . Chest pain   . DOE (dyspnea on exertion)   . Asthma 04/22/2019  . Abnormal CT of the chest 02/23/2019  . Esophageal motility disorder   . Esophageal dysmotility   . Loss of weight   . Moderate protein-calorie malnutrition (HCC)   . Hematemesis 04/06/2018  . Periesophageal hiatal hernia 08/02/2016  . Dysphagia 05/10/2016  . Esophageal stricture 05/10/2016  . Epigastric pain 05/10/2016  . Generalized  anxiety disorder 02/06/2012  . Major depressive disorder, recurrent (HCC) 02/05/2012  . Hypokalemia 11/09/2011  . Nausea and vomiting in adult 11/08/2011  . Dehydration 11/08/2011  . Tremors of nervous system 11/08/2011  . Hypothyroidism 05/10/2008  . Dyslipidemia 05/10/2008  . UNSPECIFIED ANEMIA 05/10/2008  . CAROTID ARTERY DISEASE 05/10/2008  . Transient cerebral ischemia 05/10/2008  . Chronic back pain 05/10/2008  . OSTEOPENIA 05/10/2008  . Dyspnea 05/10/2008  . Upper airway cough syndrome 05/10/2008  . ESOPHAGEAL STRICTURE  01/19/2008  . Essential hypertension 04/29/2007  . Hiatal hernia with gastroesophageal reflux 04/29/2007  . Osteoarthritis 04/29/2007  . SPONDYLOSIS 04/29/2007    Past Surgical History:  Procedure Laterality Date  . ABDOMINAL HYSTERECTOMY  1982  . BACK SURGERY    . BALLOON DILATION N/A 09/21/2018   Procedure: BALLOON DILATION;  Surgeon: Hilarie Fredrickson, MD;  Location: [facility] ENDOSCOPY;  Service: Endoscopy;  Laterality: N/A;  . BALLOON DILATION N/A 08/12/2019   Procedure: BALLOON DILATION;  Surgeon: Lynann Bologna, MD;  Location: [facility] ENDOSCOPY;  Service: Endoscopy;  Laterality: N/A;  . BIOPSY  08/12/2019   Procedure: BIOPSY;  Surgeon: Lynann Bologna, MD;  Location: [facility] ENDOSCOPY;  Service: Endoscopy;;  . BLADDER SUSPENSION  1980's  . BOTOX INJECTION N/A 09/21/2018   Procedure: BOTOX INJECTION;  Surgeon: Hilarie Fredrickson, MD;  Location: [facility] ENDOSCOPY;  Service: Endoscopy;  Laterality: N/A;  . BOTOX INJECTION N/A 08/12/2019   Procedure: BOTOX INJECTION;  Surgeon: Lynann Bologna, MD;  Location: [facility] ENDOSCOPY;  Service: Endoscopy;  Laterality: N/A;  . CATARACT EXTRACTION W/ INTRAOCULAR LENS  IMPLANT, BILATERAL Bilateral 2010  . DILATION AND CURETTAGE OF UTERUS  1961  . ESOPHAGEAL MANOMETRY N/A 03/25/2018   Procedure: ESOPHAGEAL MANOMETRY (EM);  Surgeon: Napoleon Form, MD;  Location: [facility] ENDOSCOPY;  Service: Endoscopy;  Laterality: N/A;  . ESOPHAGOGASTRODUODENOSCOPY (EGD) WITH PROPOFOL N/A 04/07/2018   Procedure: ESOPHAGOGASTRODUODENOSCOPY (EGD) WITH PROPOFOL;  Surgeon: Sherrilyn Rist, MD;  Location: [facility] ENDOSCOPY;  Service: Gastroenterology;  Laterality: N/A;  . ESOPHAGOGASTRODUODENOSCOPY (EGD) WITH PROPOFOL N/A 09/21/2018   Procedure: ESOPHAGOGASTRODUODENOSCOPY (EGD) WITH PROPOFOL;  Surgeon: Hilarie Fredrickson, MD;  Location: [facility] ENDOSCOPY;  Service: Endoscopy;  Laterality: N/A;  . ESOPHAGOGASTRODUODENOSCOPY (EGD) WITH PROPOFOL N/A 08/12/2019   Procedure: ESOPHAGOGASTRODUODENOSCOPY (EGD) WITH PROPOFOL;   Surgeon: Lynann Bologna, MD;  Location: [facility] ENDOSCOPY;  Service: Endoscopy;  Laterality: N/A;  . HERNIA REPAIR    . HIATAL HERNIA REPAIR N/A 08/02/2016   Procedure: LAPAROSCOPIC REPAIR OF LARGE  HIATAL HERNIA;  Surgeon: Glenna Fellows, MD;  Location: [facility] ORS;  Service: General;  Laterality: N/A;  . JOINT REPLACEMENT    . LAPAROSCOPIC NISSEN FUNDOPLICATION N/A 08/02/2016   Procedure: LAPAROSCOPIC NISSEN FUNDOPLICATION;  Surgeon: Glenna Fellows, MD;  Location: [facility] ORS;  Service: General;  Laterality: N/A;  . LUMBAR LAMINECTOMY  1990; 1994; [id number]   "I've got 2 stainless steel rods; 6 screws; 2 ray cages"took bone from right hip to put in back  . RIGHT/LEFT HEART CATH AND CORONARY ANGIOGRAPHY N/A 07/26/2019   Procedure: RIGHT/LEFT HEART CATH AND CORONARY ANGIOGRAPHY;  Surgeon: Lyn Records, MD;  Location: [facility] INVASIVE CV LAB;  Service: Cardiovascular;  Laterality: N/A;  . TEE WITHOUT CARDIOVERSION N/A 08/19/2019   Procedure: TRANSESOPHAGEAL ECHOCARDIOGRAM (TEE);  Surgeon: Lewayne Bunting, MD;  Location: [facility] ENDOSCOPY;  Service: Cardiovascular;  Laterality: N/A;  . TONSILLECTOMY AND ADENOIDECTOMY  1945  . TOTAL KNEE ARTHROPLASTY Right ~ 1996  . TUBAL LIGATION  ~ 1976     OB History  No obstetric history on file.     Family History  Problem Relation Age of Onset  . Kidney disease Mother   . Hypertension Mother   . Heart disease Father 59       die of MI at age 50  . Suicidality Son   . Heart disease Brother   . Heart attack Brother   . Colon cancer Neg Hx   . Esophageal cancer Neg Hx   . Pancreatic cancer Neg Hx   . Rectal cancer Neg Hx   . Stomach cancer Neg Hx     Social History   Tobacco Use  . Smoking status: Never Smoker  . Smokeless tobacco: Never Used  Vaping Use  . Vaping Use: Never used  Substance Use Topics  . Alcohol use: Never  . Drug use: Never    Home Medications Prior to Admission medications   Medication Sig Start Date End Date Taking? Authorizing  Provider  albuterol (VENTOLIN HFA) 108 (90 Base) MCG/ACT inhaler TAKE 2 PUFFS BY MOUTH EVERY 6 HOURS AS NEEDED FOR WHEEZE OR SHORTNESS OF BREATH Patient taking differently: Inhale 2 puffs into the lungs every 6 (six) hours as needed for wheezing or shortness of breath.  05/29/20  Yes Nyoka Cowden, MD  amiodarone (PACERONE) 200 MG tablet Take 1 tablet (200 mg total) by mouth daily. 09/08/19  Yes Berton Bon, NP  amLODipine (NORVASC) 2.5 MG tablet TAKE 1 TABLET BY MOUTH EVERY DAY 04/25/20  Yes Nafziger, [name], NP  apixaban (ELIQUIS) 5 MG TABS tablet Take 1 tablet (5 mg total) by mouth 2 (two) times daily. 09/23/19 09/17/20 Yes Berton Bon, NP  benzonatate (TESSALON) 200 MG capsule Take 1 capsule (200 mg total) by mouth 2 (two) times daily as needed for cough. 03/17/20  Yes Nafziger, [name], NP  carvedilol (COREG) 6.25 MG tablet Take 1 tablet (6.25 mg total) by mouth 2 (two) times daily with a meal. 09/23/19 09/17/20 Yes Berton Bon, NP  DULoxetine (CYMBALTA) 60 MG capsule TAKE 1 CAPSULE BY MOUTH EVERY DAY 05/30/20  Yes Lovorn, Aundra Millet, MD  famotidine (PEPCID) 20 MG tablet One after supper Patient taking differently: Take 20 mg by mouth daily. One after supper 10/19/19  Yes Nyoka Cowden, MD  gabapentin (NEURONTIN) 100 MG capsule TAKE 1 CAPSULE BY MOUTH THREE TIMES A DAY 05/02/20  Yes Nafziger, [name], NP  omeprazole (PRILOSEC) 20 MG capsule Take 1 capsule (20 mg total) by mouth daily. 10/27/19  Yes Nafziger, [name], NP  sacubitril-valsartan (ENTRESTO) 97-103 MG Take 1 tablet by mouth 2 (two) times daily. 03/24/20  Yes Wendall Stade, MD  traMADol (ULTRAM) 50 MG tablet Take 2 tablets (100 mg total) by mouth 2 (two) times daily. 04/10/20  Yes Lovorn, Aundra Millet, MD  amoxicillin-clavulanate (AUGMENTIN) 875-125 MG tablet Take 1 tablet by mouth every 12 (twelve) hours. 06/30/20   Loree Fee, MD  clonazePAM (KLONOPIN) 0.5 MG tablet Take 1 tablet (0.5 mg total) by mouth 2 (two) times daily as needed for  anxiety. TAKE 1 TABLET BY MOUTH TWICE A DAY AS NEEDED FOR ANXIETY 06/30/20   Loree Fee, MD  furosemide (LASIX) 40 MG tablet Take 1 tablet (40 mg total) by mouth daily as needed for fluid or edema. Patient not taking: Reported on 06/30/2020 04/18/20   Wendall Stade, MD  HYDROcodone-acetaminophen (NORCO) 5-325 MG tablet Take 1 tablet by mouth 3 (three) times daily as needed for severe pain (for really bad days). 06/26/20   Lovorn, Aundra Millet, MD  spironolactone (ALDACTONE)  25 MG tablet Take 1 tablet (25 mg total) by mouth daily. Patient not taking: Reported on 06/30/2020 11/09/19 11/03/20  Berton Bon, NP    Allergies    Morphine and Tape  Review of Systems   Review of Systems  Constitutional: Positive for fatigue. Negative for chills and fever.  HENT: Negative for facial swelling and voice change.   Eyes: Negative for redness and visual disturbance.  Respiratory: Positive for chest tightness and shortness of breath. Negative for cough.   Cardiovascular: Negative for chest pain and palpitations.  Gastrointestinal: Negative for abdominal pain and vomiting.  Genitourinary: Negative for difficulty urinating and dysuria.  Musculoskeletal: Negative for gait problem and joint swelling.  Skin: Negative for rash and wound.  Neurological: Positive for weakness. Negative for dizziness and headaches.  Psychiatric/Behavioral: Negative for confusion and suicidal ideas.    Physical Exam Updated Vital Signs BP (!) 126/99   Pulse 90   Temp 98.5 F (36.9 C) (Oral)   Resp (!) 21   Ht 5\' 1"  (1.549 m)   Wt 64.4 kg   SpO2 100%   BMI 26.83 kg/m   Physical Exam Constitutional:      General: She is not in acute distress. HENT:     Head: Normocephalic and atraumatic.     Mouth/Throat:     Mouth: Mucous membranes are moist.     Pharynx: Oropharynx is clear.  Eyes:     General: No scleral icterus.    Pupils: Pupils are equal, round, and reactive to light.  Cardiovascular:     Rate and Rhythm:  Normal rate and regular rhythm.     Pulses: Normal pulses.  Pulmonary:     Effort: Pulmonary effort is normal. No respiratory distress.     Breath sounds: No wheezing, rhonchi or rales.  Abdominal:     General: There is no distension.     Tenderness: There is no abdominal tenderness.  Musculoskeletal:        General: No tenderness or deformity.     Cervical back: Normal range of motion and neck supple.  Neurological:     General: No focal deficit present.     Mental Status: She is alert and oriented to person, place, and time.  Psychiatric:        Mood and Affect: Mood normal.        Behavior: Behavior normal.     ED Results / Procedures / Treatments   Labs (all labs ordered are listed, but only abnormal results are displayed) Labs Reviewed  BASIC METABOLIC PANEL - Abnormal; Notable for the following components:      Result Value   Glucose, Bld 103 (*)    All other components within normal limits  BRAIN NATRIURETIC PEPTIDE - Abnormal; Notable for the following components:   B Natriuretic Peptide 359.6 (*)    All other components within normal limits  CBC  TROPONIN I (HIGH SENSITIVITY)  TROPONIN I (HIGH SENSITIVITY)    EKG EKG Interpretation  Date/Time:  Friday June 30 2020 10:23:03 EDT Ventricular Rate:  84 PR Interval:  154 QRS Duration: 142 QT Interval:  444 QTC Calculation: 524 R Axis:   -60 Text Interpretation: Sinus rhythm with Premature atrial complexes Left axis deviation Left bundle branch block Abnormal ECG Since last tracing 08/2019, now with LBBB present - QRS wider, otherwise unchanged Confirmed by 09/2019 (Eber Hong) on 06/30/2020 4:20:22 PM   Radiology DG Chest 2 View  Result Date: 06/30/2020 CLINICAL DATA:  Chest pain and  shortness of breath. EXAM: CHEST - 2 VIEW COMPARISON:  10/19/2019 FINDINGS: Heart size is normal. Ordinary tortuosity of the aorta. Slightly increase markings in the left lower lobe compared to the previous study could represent  early pneumonia. No dense consolidation, lobar collapse or effusion. Chronic spinal curvature and lumbar fusion as seen previously. IMPRESSION: Question early pneumonia in the left lower lobe. Electronically Signed   By: Paulina Fusi M.D.   On: 06/30/2020 11:12    Procedures Procedures (including critical care time)  Medications Ordered in ED Medications  amiodarone (PACERONE) tablet 200 mg (200 mg Oral Given 06/30/20 1750)  amLODipine (NORVASC) tablet 2.5 mg (2.5 mg Oral Given 06/30/20 1751)  apixaban (ELIQUIS) tablet 5 mg (has no administration in time range)  carvedilol (COREG) tablet 6.25 mg (6.25 mg Oral Given 06/30/20 1750)  DULoxetine (CYMBALTA) DR capsule 60 mg (60 mg Oral Not Given 06/30/20 1755)  gabapentin (NEURONTIN) capsule 100 mg (100 mg Oral Given 06/30/20 1758)  cefTRIAXone (ROCEPHIN) 1 g in sodium chloride 0.9 % 100 mL IVPB (0 g Intravenous Stopped 06/30/20 1825)  LORazepam (ATIVAN) injection 1 mg (1 mg Intravenous Given 06/30/20 1751)  doxycycline (VIBRA-TABS) tablet 100 mg (100 mg Oral Given 06/30/20 1930)    ED Course  I have reviewed the triage vital signs and the nursing notes.  Pertinent labs & imaging results that were available during my care of the patient were reviewed by me and considered in my medical decision making (see chart for details).  Clinical Course as of Jul 01 1947  Fri Jun 30, 2020  1830 Appears to be lower the patient's baseline  B Natriuretic Peptide(!): 359.6 [JR]  1830 Delta troponins both negative in the setting of nonischemic EKG, low concern for ACS, particularly considering negative cardiac cath last September.  Troponin I (High Sensitivity): 7 [JR]    Clinical Course User Index [JR] Loree Fee, MD   MDM Rules/Calculators/A&P                          Differential diagnosis considered: ACS, PE, pneumonia, COVID-19, COPD exacerbation, pneumothorax, anxiety, arrhythmia  Patient presented to the ED for chest discomfort and shortness  of breath gradual worsening of her 2-day interval was exertional in nature however patient states that symptoms have completely resolved without intervention in triage and notably she is saturating 100% without any oxygen at all despite a baseline oxygen requirement of 2 L.  Lung fields are notably clear and she is in no distress.   Patient given Rocephin and doxycycline for community-acquired pneumonia.  Ambulated without any drop in saturation however the patient became tremulous and tachypneic, on further investigation patient states that she has been out of her Klonopin for a couple of days and is requesting medicine "for her nerves"  Suspect high likelihood for some degree of benzodiazepine withdrawal, will give 1 mg of IV Ativan and reassess.  On reassessment, patient dramatically improved, ambulated easily without tachypnea or diaphoresis, states that she feels "better than usual"  At this time I feel the patient stable for discharge home, will refill short course of Klonopin, will administer 1 week course of CAP coverage with Augmentin. Counseled to call PCP tomorrow and discuss close follow-up, refill of Klonopin as needed, consideration of taper if no longer indicated.  Final Clinical Impression(s) / ED Diagnoses Final diagnoses:  Community acquired pneumonia of left lower lobe of lung  Benzodiazepine withdrawal without complication (HCC)    Rx /  DC Orders ED Discharge Orders         Ordered    clonazePAM (KLONOPIN) 0.5 MG tablet  2 times daily PRN       Note to Pharmacy: Not to exceed 4 additional fills before 10/29/2020   06/30/20 1947    amoxicillin-clavulanate (AUGMENTIN) 875-125 MG tablet  Every 12 hours        06/30/20 1947         Labs, studies and imaging reviewed by myself and considered in medical decision making if ordered. Imaging interpreted by radiology. Pt was discussed with my attending, Dr. Hyacinth Meeker  Electronically signed by:  Christiane Ha Redding8/27/20217:48  PM       Loree Fee, MD 06/30/20 Mila Merry    Eber Hong, MD 07/08/20 1438

## 2020-06-30 NOTE — ED Notes (Signed)
Patient ambulated in room with pulse oximetry. SpO2 remained > 92% on room air while ambulating. Patient did not appear in any distress but reported feeling SOB.

## 2020-06-30 NOTE — ED Provider Notes (Signed)
I saw and evaluated the patient, reviewed the resident's note and I agree with the findings and plan.  Pertinent History: This patient is an 84 year old female, she presents to the hospital with chest pain shortness of breath, she states that started last night, it got better by the time she arrived in the emergency department and has no symptoms at this time  Pertinent Exam findings: On exam this patient is well-appearing without distress, clear lung sounds, no rales or wheezing, clear heart sounds, normal pulses, no JVD or significant edema.  She is alert and oriented and able to follow commands.  I was personally present and directly supervised the following procedures:  Medical evaluation  I personally interpreted the EKG as well as the resident and agree with the interpretation on the resident's chart.  Final diagnoses:  Community acquired pneumonia of left lower lobe of lung  Benzodiazepine withdrawal without complication (HCC)      Eber Hong, MD 07/08/20 1438

## 2020-06-30 NOTE — ED Notes (Signed)
Daughter In Law - [phone] Sharon [place] would like an updated

## 2020-07-05 DIAGNOSIS — I509 Heart failure, unspecified: Secondary | ICD-10-CM

## 2020-07-05 HISTORY — DX: Heart failure, unspecified: I50.9

## 2020-07-12 NOTE — Progress Notes (Signed)
Cardiology Office Note:    Date:  07/14/2020   ID:  Julie Rice, DOB July 04, 1936, MRN [id number]  PCP:  Shirline Frees, NP  Cardiologist:  Charlton Haws, MD  Referring MD: Shirline Frees, NP   No chief complaint on file.   History of Present Illness:    Julie Rice is a 84 y.o. female with a past medical history significant for hypertension, hyperlipidemia, systolic CHF, fibromyalgia, anemia, esophageal stricture status post balloon dilatation, large symptomatic hiatal hernia status post fundoplication, chronic dysphagia, asthma/COPD on oxygen at 2 L at home, bipolar disorder, rheumatoid arthritis, TIA and chronic urinary incontinence.  Right and left heart cath on 07/26/2019 showed only mild nonobstructive disease and low right heart pressures.08/16/19 had PAF EF 40-45% by TTE with convertion using amiodarone Sees pulmonary with ambulatory desats on oxygen Having some headaches and low BP evening coreg held at times   Continues to have significant dyspnea Seems to breath heavy in room and sigh a lot  Echo 01/12/20 showed improved EF 50-55% just mild LAE, MR and AR  Tested positive for COVID 03/22/20 Not hospitalized Most recent CXR in Epic 06/30/20 showed ? Early LLL pneumonia Seen in ER for chest pain and dyspnea 06/30/20 Resolved spontaneously Troponin negative BNP only minimally elevated  Rx with Rocephin/ Augmentin for community acquired pneumonia Had run out of her Klonopin and ? Some BZ withdrawal   Has f/u with DR Sherene [facility] soon Encouraged her to get vaccinated    Cardiac studies   TEE 08/19/2019 IMPRESSIONS  1. Left ventricular ejection fraction, by visual estimation, is 25 to 30%. The left ventricle has severely decreased function. There is no left ventricular hypertrophy. 2. Global right ventricle has normal systolic function.The right ventricular size is normal. 3. Left atrial size was severely dilated. 4. Right atrial size was moderately dilated. 5.  Mild mitral annular calcification. 6. The mitral valve is normal in structure. Moderate mitral valve regurgitation. 7. The tricuspid valve is normal in structure. Tricuspid valve regurgitation moderate. 8. The aortic valve is tricuspid Aortic valve regurgitation is mild by color flow Doppler. Mild aortic valve sclerosis without stenosis. 9. The pulmonic valve was grossly normal. Pulmonic valve regurgitation is mild by color flow Doppler. 10. Mild plaque invoving the descending aorta. 11. Severe global reduction in LV systolic function; biatrial enlargement; atrial septal aneurysm; no LAA thrombus; mild AI; moderate MR and TR.   Echo 01/12/20  EF 50-55%  Mild LAE Mild MR Mild AR   RIGHT/LEFT HEART CATH AND CORONARY ANGIOGRAPHY  07/26/2019  Conclusion  Right dominant coronary anatomy  Widely patent left main  30 to 40% mid LAD  Widely patent circumflex  Luminal irregularities in a dominant right coronary.  Right heart pressures are relatively low. Pulmonary wedge pressure mean is 2 mmHg.  LVEDP 4 mmHg. Left ventriculography by hand-injection was not helpful. From images obtained, LV function appears relatively normal.  RECOMMENDATIONS:  Per treating team.  Relatively volume contracted and therefore we will give normal saline 100 cc/h for the next 4 hours.     Past Medical History:  Diagnosis Date  . Anemia   . Anxiety   . Bipolar disorder (HCC)   . Blood transfusion 1991   autologous pts own blood given   . CAD (coronary artery disease)   . Cataract   . Chest pain    "@ rest, lying down, w/exertion"  . Chronic back pain    "mostly lower back but I do have upper back  pain regularly" (04/06/2018)  . COPD (chronic obstructive pulmonary disease) (HCC)   . Depression   . Esophageal stricture   . Fibromyalgia   . GERD (gastroesophageal reflux disease)   . Heart murmur    "slight" (04/06/2018)  . Hiatal hernia   . Hyperlipidemia    Patient denies  .  Hypertension   . Internal hemorrhoids   . Lumbago   . Mitral regurgitation   . Osteoarthritis   . Osteoporosis   . Pneumonia ~ 01/2018  . PONV (postoperative nausea and vomiting)    severe ponv, "in the past" (04/06/2018)  . Rectal bleeding   . Rheumatoid arthritis (HCC)   . Spondylosis   . TIA (transient ischemic attack) 2013  . Urinary incontinence    wears depends    Past Surgical History:  Procedure Laterality Date  . ABDOMINAL HYSTERECTOMY  1982  . BACK SURGERY    . BALLOON DILATION N/A 09/21/2018   Procedure: BALLOON DILATION;  Surgeon: Hilarie Fredrickson, MD;  Location: [facility] ENDOSCOPY;  Service: Endoscopy;  Laterality: N/A;  . BALLOON DILATION N/A 08/12/2019   Procedure: BALLOON DILATION;  Surgeon: Lynann Bologna, MD;  Location: [facility] ENDOSCOPY;  Service: Endoscopy;  Laterality: N/A;  . BIOPSY  08/12/2019   Procedure: BIOPSY;  Surgeon: Lynann Bologna, MD;  Location: [facility] ENDOSCOPY;  Service: Endoscopy;;  . BLADDER SUSPENSION  1980's  . BOTOX INJECTION N/A 09/21/2018   Procedure: BOTOX INJECTION;  Surgeon: Hilarie Fredrickson, MD;  Location: [facility] ENDOSCOPY;  Service: Endoscopy;  Laterality: N/A;  . BOTOX INJECTION N/A 08/12/2019   Procedure: BOTOX INJECTION;  Surgeon: Lynann Bologna, MD;  Location: [facility] ENDOSCOPY;  Service: Endoscopy;  Laterality: N/A;  . CATARACT EXTRACTION W/ INTRAOCULAR LENS  IMPLANT, BILATERAL Bilateral 2010  . DILATION AND CURETTAGE OF UTERUS  1961  . ESOPHAGEAL MANOMETRY N/A 03/25/2018   Procedure: ESOPHAGEAL MANOMETRY (EM);  Surgeon: Napoleon Form, MD;  Location: [facility] ENDOSCOPY;  Service: Endoscopy;  Laterality: N/A;  . ESOPHAGOGASTRODUODENOSCOPY (EGD) WITH PROPOFOL N/A 04/07/2018   Procedure: ESOPHAGOGASTRODUODENOSCOPY (EGD) WITH PROPOFOL;  Surgeon: Sherrilyn Rist, MD;  Location: [facility] ENDOSCOPY;  Service: Gastroenterology;  Laterality: N/A;  . ESOPHAGOGASTRODUODENOSCOPY (EGD) WITH PROPOFOL N/A 09/21/2018   Procedure: ESOPHAGOGASTRODUODENOSCOPY (EGD) WITH PROPOFOL;  Surgeon:  Hilarie Fredrickson, MD;  Location: [facility] ENDOSCOPY;  Service: Endoscopy;  Laterality: N/A;  . ESOPHAGOGASTRODUODENOSCOPY (EGD) WITH PROPOFOL N/A 08/12/2019   Procedure: ESOPHAGOGASTRODUODENOSCOPY (EGD) WITH PROPOFOL;  Surgeon: Lynann Bologna, MD;  Location: [facility] ENDOSCOPY;  Service: Endoscopy;  Laterality: N/A;  . HERNIA REPAIR    . HIATAL HERNIA REPAIR N/A 08/02/2016   Procedure: LAPAROSCOPIC REPAIR OF LARGE  HIATAL HERNIA;  Surgeon: Glenna Fellows, MD;  Location: [facility] ORS;  Service: General;  Laterality: N/A;  . JOINT REPLACEMENT    . LAPAROSCOPIC NISSEN FUNDOPLICATION N/A 08/02/2016   Procedure: LAPAROSCOPIC NISSEN FUNDOPLICATION;  Surgeon: Glenna Fellows, MD;  Location: [facility] ORS;  Service: General;  Laterality: N/A;  . LUMBAR LAMINECTOMY  1990; 1994; [id number]   "I've got 2 stainless steel rods; 6 screws; 2 ray cages"took bone from right hip to put in back  . RIGHT/LEFT HEART CATH AND CORONARY ANGIOGRAPHY N/A 07/26/2019   Procedure: RIGHT/LEFT HEART CATH AND CORONARY ANGIOGRAPHY;  Surgeon: Lyn Records, MD;  Location: [facility] INVASIVE CV LAB;  Service: Cardiovascular;  Laterality: N/A;  . TEE WITHOUT CARDIOVERSION N/A 08/19/2019   Procedure: TRANSESOPHAGEAL ECHOCARDIOGRAM (TEE);  Surgeon: Lewayne Bunting, MD;  Location: [facility] ENDOSCOPY;  Service: Cardiovascular;  Laterality: N/A;  .  TONSILLECTOMY AND ADENOIDECTOMY  1945  . TOTAL KNEE ARTHROPLASTY Right ~ 1996  . TUBAL LIGATION  ~ 1976    Current Medications: No outpatient medications have been marked as taking for the 07/14/20 encounter (Appointment) with Wendall Stade, MD.     Allergies:   Morphine and Tape   Social History   Socioeconomic History  . Marital status: Widowed    Spouse name: Not on file  . Number of children: 2  . Years of education: Not on file  . Highest education level: Not on file  Occupational History    Employer: RETIRED  Tobacco Use  . Smoking status: Never Smoker  . Smokeless tobacco: Never Used  Vaping Use  .  Vaping Use: Never used  Substance and Sexual Activity  . Alcohol use: Never  . Drug use: Never  . Sexual activity: Not Currently  Other Topics Concern  . Not on file  Social History Narrative   Retired    Widowed   Social Determinants of Corporate investment banker Strain:   . Difficulty of Paying Living Expenses: Not on file  Food Insecurity:   . Worried About Programme researcher, broadcasting/film/video in the Last Year: Not on file  . Ran Out of Food in the Last Year: Not on file  Transportation Needs:   . Lack of Transportation (Medical): Not on file  . Lack of Transportation (Non-Medical): Not on file  Physical Activity:   . Days of Exercise per Week: Not on file  . Minutes of Exercise per Session: Not on file  Stress:   . Feeling of Stress : Not on file  Social Connections:   . Frequency of Communication with Friends and Family: Not on file  . Frequency of Social Gatherings with Friends and Family: Not on file  . Attends Religious Services: Not on file  . Active Member of Clubs or Organizations: Not on file  . Attends Banker Meetings: Not on file  . Marital Status: Not on file     Family History: The patient's family history includes Heart attack in her brother; Heart disease in her brother; Heart disease (age of onset: 38) in her father; Hypertension in her mother; Kidney disease in her mother; Suicidality in her son. There is no history of Colon cancer, Esophageal cancer, Pancreatic cancer, Rectal cancer, or Stomach cancer. ROS:   Please see the history of present illness.     All other systems reviewed and are negative.   EKG:   11/11/19 sinus rhythm, LAD, LBBB, 63 bpm, machine read QTC is 519 however by my measurement QTC is more like 440.  Recent Labs: 08/23/2019: Magnesium 1.8 12/29/2019: NT-Pro BNP 552 03/09/2020: ALT 14; TSH 4.89 06/30/2020: B Natriuretic Peptide 359.6; BUN 13; Creatinine, Ser 0.83; Hemoglobin 13.8; Platelets 290; Potassium 4.5; Sodium 138   Recent  Lipid Panel    Component Value Date/Time   CHOL 172 03/09/2020 0936   TRIG 111.0 03/09/2020 0936   HDL 65.60 03/09/2020 0936   CHOLHDL 3 03/09/2020 0936   VLDL 22.2 03/09/2020 0936   LDLCALC 84 03/09/2020 0936   LDLDIRECT 122.2 06/01/2013 0944    Physical Exam:    VS:  There were no vitals taken for this visit.    Wt Readings from Last 6 Encounters:  06/30/20 142 lb (64.4 kg)  06/26/20 142 lb 3.2 oz (64.5 kg)  04/12/20 141 lb (64 kg)  04/10/20 141 lb 6.4 oz (64.1 kg)  03/22/20 139 lb (63  kg)  03/09/20 142 lb (64.4 kg)     Affect appropriate Chronically ill female  HEENT: normal Neck supple with no adenopathy JVP normal no bruits no thyromegaly Lungs clear with no wheezing and good diaphragmatic motion Heart:  S1/S2 no murmur, no rub, gallop or click PMI normal Abdomen: benighn, BS positve, no tenderness, no AAA no bruit.  No HSM or HJR Distal pulses intact with no bruits No edema Neuro non-focal Skin warm and dry No muscular weakness   ASSESSMENT:    No diagnosis found. PLAN:    In order of problems listed above:  Chronic systolic heart failure: -EF improved by TTE March 50-55% continue medical Rx cath no obstructive CAD 07/26/19  only mildly elevated right heart pressures on right heart cath   Atrial fibrillation: - converted with amiodarone TSH normal in May Pulmonary can f/u with PfTls / DLCO given lung issues  -CHADSVASC score is 7.  Patient continues on Eliquis. No unusual bleeding.   Nonobstructive CAD: -by cardiac catheterization on September 21. -No angina  NSVT: -Patient had some episodes of NSVT in the hospital on telemetry.  On beta-blocker. No perceived recurrences.   Pulmonary:  COPD on 2L at home Covid positive May and abnormal CXR 06/24/20 with LLL pneumonia RX with Rocephin and Augmentin F/U [facility] and or Pulmonary   Needs COVID vaccine   Medication Adjustments/Labs and Tests Ordered: Current medicines are  reviewed at length with the patient today.  Concerns regarding medicines are outlined above. Labs and tests ordered and medication changes are outlined in the patient instructions below:  There are no Patient Instructions on file for this visit.   Signed, Charlton Haws, MD  07/14/2020 3:03 PM    [facility]

## 2020-07-14 ENCOUNTER — Ambulatory Visit: Payer: Medicare Other | Admitting: Cardiovascular Disease

## 2020-07-14 ENCOUNTER — Encounter: Payer: Self-pay | Admitting: Cardiovascular Disease

## 2020-07-14 ENCOUNTER — Other Ambulatory Visit: Payer: Self-pay

## 2020-07-14 VITALS — BP 118/74 | HR 70 | Ht 61.5 in | Wt 146.2 lb

## 2020-07-14 DIAGNOSIS — I5022 Chronic systolic (congestive) heart failure: Secondary | ICD-10-CM

## 2020-07-14 DIAGNOSIS — I480 Paroxysmal atrial fibrillation: Secondary | ICD-10-CM

## 2020-07-14 NOTE — Patient Instructions (Signed)

## 2020-07-21 ENCOUNTER — Other Ambulatory Visit: Payer: Self-pay

## 2020-07-21 ENCOUNTER — Ambulatory Visit: Payer: Medicare Other | Admitting: Obstetrics and Gynecology

## 2020-07-21 ENCOUNTER — Encounter: Payer: Self-pay | Admitting: Obstetrics and Gynecology

## 2020-07-21 VITALS — BP 116/70 | Ht 61.0 in | Wt 146.0 lb

## 2020-07-21 DIAGNOSIS — N950 Postmenopausal bleeding: Secondary | ICD-10-CM

## 2020-07-21 DIAGNOSIS — Z90710 Acquired absence of both cervix and uterus: Secondary | ICD-10-CM

## 2020-07-21 NOTE — Progress Notes (Signed)
Julie Rice [birth year] [id number]  SUBJECTIVE:  84 y.o. [id number] female with a prior hysterectomy who presents as a referral for vaginal bleeding.  2012 Pap smear was normal.  The vaginal bleeding was preceded by a fall outside as she was taking out the trash and she hit the ground but caught herself.  She noticed in the hours after the fall that she was having bright red vaginal bleeding which she says was about like a regular period.  The bleeding slowed but did continue for about 2 days before it did finally stop.  This was earlier in the month.  No vaginal bleeding now.  She is not having any unusual abdominal pain or vaginal discharge.  She had a hysterectomy in the 80s for heavy periods and said no cancer or abnormal tissue.  She was on hormone replacement therapy but has been off of that for more than 20 years.  She says she has never had any abnormal Pap smears.  She does take Eliquis.   Current Outpatient Medications  Medication Sig Dispense Refill  . albuterol (VENTOLIN HFA) 108 (90 Base) MCG/ACT inhaler TAKE 2 PUFFS BY MOUTH EVERY 6 HOURS AS NEEDED FOR WHEEZE OR SHORTNESS OF BREATH 4 g 2  . amiodarone (PACERONE) 200 MG tablet Take 1 tablet (200 mg total) by mouth daily. 90 tablet 3  . amLODipine (NORVASC) 2.5 MG tablet TAKE 1 TABLET BY MOUTH EVERY DAY 90 tablet 3  . apixaban (ELIQUIS) 5 MG TABS tablet Take 1 tablet (5 mg total) by mouth 2 (two) times daily. 180 tablet 3  . carvedilol (COREG) 6.25 MG tablet Take 1 tablet (6.25 mg total) by mouth 2 (two) times daily with a meal. 180 tablet 3  . clonazePAM (KLONOPIN) 0.5 MG tablet Take 1 tablet (0.5 mg total) by mouth 2 (two) times daily as needed for anxiety. TAKE 1 TABLET BY MOUTH TWICE A DAY AS NEEDED FOR ANXIETY 30 tablet 0  . DULoxetine (CYMBALTA) 60 MG capsule TAKE 1 CAPSULE BY MOUTH EVERY DAY 90 capsule 0  . furosemide (LASIX) 40 MG tablet Take 1 tablet (40 mg total) by mouth daily as needed for fluid or edema. 30 tablet 3  .  gabapentin (NEURONTIN) 100 MG capsule TAKE 1 CAPSULE BY MOUTH THREE TIMES A DAY 270 capsule 1  . HYDROcodone-acetaminophen (NORCO) 5-325 MG tablet Take 1 tablet by mouth 3 (three) times daily as needed for severe pain (for really bad days). 20 tablet 0  . omeprazole (PRILOSEC) 20 MG capsule Take 1 capsule (20 mg total) by mouth daily. 90 capsule 3  . sacubitril-valsartan (ENTRESTO) 97-103 MG Take 1 tablet by mouth 2 (two) times daily. 60 tablet 6  . spironolactone (ALDACTONE) 25 MG tablet Take 1 tablet (25 mg total) by mouth daily. 90 tablet 3  . traMADol (ULTRAM) 50 MG tablet Take 2 tablets (100 mg total) by mouth 2 (two) times daily. 120 tablet 5   No current facility-administered medications for this visit.   Allergies: Morphine and Tape  No LMP recorded. Patient has had a hysterectomy.  Past medical history,surgical history, problem list, medications, allergies, family history and social history were all reviewed and documented as reviewed in the EPIC chart.  ROS: Pertinent positives and negatives as reviewed in HPI.   OBJECTIVE:  BP 116/70   Ht 5\' 1"  (1.549 m)   Wt 146 lb (66.2 kg)   BMI 27.59 kg/m  The patient appears well, alert, oriented, in no distress. PELVIC EXAM: VULVA:  normal appearing vulva with significant atrophic change, no masses, tenderness or lesions, VAGINA: normal appearing vagina with atrophic change, normal color and discharge, no lesions, urethral prolapse noted, no cystocele or rectocele, well supported vaginal cuff, CERVIX: surgically absent, UTERUS: surgically absent, vaginal cuff normal, ADNEXA: normal adnexa in size, nontender and no masses A mirror was held in place to show the patient some of the vulvar and vaginal anatomy that may have been accountable for the bleeding episode Chaperone: Kennon Portela present during the examination   ASSESSMENT:  84 y.o. [id number] here for evaluation of a transient vaginal bleeding episode likely from a combination of trauma  and atrophic vaginal tissues  PLAN:  The pelvic exam is normal today.  She has a few potential tissue sources that are normal but could have accounted for the bleeding episode after she had a fall earlier this month.  Her atrophic vulvar tissues even after gentle speculum exam even exhibited some signs of mild bleeding.  She is on anticoagulation.  Hemostasis was noted by the conclusion of the examination. Therefore, I suspect the friction from the trauma of the fall probably precipitated bleeding from these delicate and atrophic tissues.  She also has some urethral prolapse which potentially could also have accounted for some of the bleeding.  In any event, her exam is normal today and I reassured her that there are no findings that should be cause for alarm.  Should she develop any ongoing vaginal bleeding then she should come back for repeat evaluation.   Theresia Majors MD 07/21/20

## 2020-07-24 ENCOUNTER — Other Ambulatory Visit: Payer: Self-pay | Admitting: *Deleted

## 2020-07-24 ENCOUNTER — Other Ambulatory Visit: Payer: Self-pay

## 2020-07-24 MED ORDER — AMIODARONE HCL 200 MG PO TABS
200.0000 mg | ORAL_TABLET | Freq: Every day | ORAL | 3 refills | Status: DC
Start: 2020-07-24 — End: 2021-04-05

## 2020-07-24 MED ORDER — CARVEDILOL 6.25 MG PO TABS
6.2500 mg | ORAL_TABLET | Freq: Two times a day (BID) | ORAL | 3 refills | Status: DC
Start: 2020-07-24 — End: 2021-04-05

## 2020-07-25 ENCOUNTER — Telehealth: Payer: Self-pay | Admitting: Adult Health

## 2020-07-25 ENCOUNTER — Other Ambulatory Visit: Payer: Self-pay | Admitting: Cardiovascular Disease

## 2020-07-25 NOTE — Telephone Encounter (Signed)
Left message for patient to schedule Annual Wellness Visit.  Please schedule with Nurse Health Advisor Shannon Crews, RN at [facility]  

## 2020-07-26 ENCOUNTER — Encounter: Payer: Self-pay | Admitting: Adult Health

## 2020-07-26 DIAGNOSIS — J449 Chronic obstructive pulmonary disease, unspecified: Secondary | ICD-10-CM | POA: Diagnosis not present

## 2020-07-26 DIAGNOSIS — J9611 Chronic respiratory failure with hypoxia: Secondary | ICD-10-CM | POA: Diagnosis not present

## 2020-07-28 ENCOUNTER — Other Ambulatory Visit: Payer: Self-pay | Admitting: *Deleted

## 2020-07-28 DIAGNOSIS — R0609 Other forms of dyspnea: Secondary | ICD-10-CM

## 2020-07-28 DIAGNOSIS — R0600 Dyspnea, unspecified: Secondary | ICD-10-CM

## 2020-07-28 NOTE — Progress Notes (Signed)
PFT ordered by Dr. Eden Emms.

## 2020-07-29 ENCOUNTER — Inpatient Hospital Stay (HOSPITAL_COMMUNITY): Admission: RE | Admit: 2020-07-29 | Payer: Medicare Other | Source: Ambulatory Visit

## 2020-07-31 ENCOUNTER — Other Ambulatory Visit: Payer: Self-pay

## 2020-07-31 ENCOUNTER — Ambulatory Visit: Payer: Medicare Other | Admitting: Internal Medicine

## 2020-08-01 ENCOUNTER — Encounter: Payer: Self-pay | Admitting: *Deleted

## 2020-08-07 ENCOUNTER — Other Ambulatory Visit: Payer: Self-pay

## 2020-08-07 ENCOUNTER — Encounter: Payer: Self-pay | Admitting: Physical Medicine and Rehabilitation

## 2020-08-07 ENCOUNTER — Encounter
Payer: Medicare Other | Attending: Physical Medicine and Rehabilitation | Admitting: Physical Medicine and Rehabilitation

## 2020-08-07 VITALS — BP 119/76 | HR 82 | Temp 98.0°F | Ht 61.0 in | Wt 141.6 lb

## 2020-08-07 DIAGNOSIS — M797 Fibromyalgia: Secondary | ICD-10-CM | POA: Insufficient documentation

## 2020-08-07 DIAGNOSIS — G894 Chronic pain syndrome: Secondary | ICD-10-CM | POA: Insufficient documentation

## 2020-08-07 DIAGNOSIS — I4891 Unspecified atrial fibrillation: Secondary | ICD-10-CM | POA: Diagnosis not present

## 2020-08-07 DIAGNOSIS — M069 Rheumatoid arthritis, unspecified: Secondary | ICD-10-CM | POA: Insufficient documentation

## 2020-08-07 DIAGNOSIS — M7918 Myalgia, other site: Secondary | ICD-10-CM | POA: Diagnosis not present

## 2020-08-07 NOTE — Patient Instructions (Signed)
1. Doesn't need refill oN norco as of yet.   2. Tramadol has 3 RFs on it, so doing OK.   3. Patient here for trigger point injections for myofascial pain  Consent done and on chart.  Cleaned areas with alcohol and injected using a 27 gauge 1.5 inch needle  Injected 5cc Using 1% Lidocaine with no EPI  Upper traps B/L Levators B/L Posterior scalenes B/L Middle scalenes Splenius Capitus Pectoralis Major B/L Rhomboids b/L x2 Infraspinatus Teres Major/minor Thoracic paraspinals Lumbar paraspinals Other injections-    Patient's level of pain prior was 8/10 (was doing a lot) Current level of pain after injections is 4/10  There was no bleeding or complications.  Patient was advised to drink a lot of water on day after injections to flush system Will have increased soreness for 12-48 hours after injections.  Can use Lidocaine patches the day AFTER injections Can use theracane on day of injections in places didn't inject Can use heating pad 4-6 hours AFTER injections  4. F/U in 6-8 weeks  5. Eliquis if stops it, might cause a stroke- call Cards to let them know cannot afford Eliquis- might be able to come up with  solution.

## 2020-08-07 NOTE — Progress Notes (Signed)
Patient is an 84 yr old female with hx of . Moderate or severe neural foraminal stenosis at the left T1, right T6, and bilateral T9 through T12 nerve levelswith secondary myofascial pain. Also with Afib, on Amiodarone/ and Eliquis.     Pain mainly upper back right now.  Also needs to get injection into L knee again[facility] he does her L knee injections.    Hasn't taken Norco so far-  Needs refills of Tramadol.    Eliquis if stops it, might cause a stroke- call [facility] to let them know cannot afford Eliquis- might be able to come up solution.   Plan: 1. Doesn't need refill oN norco as of yet.   2. Tramadol has 3 RFs on it, so doing OK.   3. Patient here for trigger point injections for myofascial pain  Consent done and on chart.  Cleaned areas with alcohol and injected using a 27 gauge 1.5 inch needle  Injected 5cc Using 1% Lidocaine with no EPI  Upper traps B/L Levators B/L Posterior scalenes B/L Middle scalenes Splenius Capitus Pectoralis Major B/L Rhomboids b/L x2 Infraspinatus Teres Major/minor Thoracic paraspinals Lumbar paraspinals Other injections-    Patient's level of pain prior was 8/10 (was doing a lot) Current level of pain after injections is 4/10  There was no bleeding or complications.  Patient was advised to drink a lot of water on day after injections to flush system Will have increased soreness for 12-48 hours after injections.  Can use Lidocaine patches the day AFTER injections Can use theracane on day of injections in places didn't inject Can use heating pad 4-6 hours AFTER injections  4. F/U in 6-8 weeks   I spent a total of 30 minutes on visit. As detailed above.

## 2020-08-08 DIAGNOSIS — H353131 Nonexudative age-related macular degeneration, bilateral, early dry stage: Secondary | ICD-10-CM | POA: Diagnosis not present

## 2020-08-08 DIAGNOSIS — H40013 Open angle with borderline findings, low risk, bilateral: Secondary | ICD-10-CM | POA: Diagnosis not present

## 2020-08-08 DIAGNOSIS — H04123 Dry eye syndrome of bilateral lacrimal glands: Secondary | ICD-10-CM | POA: Diagnosis not present

## 2020-08-08 DIAGNOSIS — H02831 Dermatochalasis of right upper eyelid: Secondary | ICD-10-CM | POA: Diagnosis not present

## 2020-08-14 ENCOUNTER — Telehealth: Payer: Self-pay | Admitting: Cardiovascular Disease

## 2020-08-14 NOTE — Telephone Encounter (Signed)
Patient is returning Dr. Fabio Bering nurse phone call.

## 2020-08-14 NOTE — Telephone Encounter (Signed)
Called patient back to let her know to check her mychart message and that she has some samples to pick up at the front desk. Patient stated she will read her mychart message and pick up samples tomorrow.

## 2020-08-21 ENCOUNTER — Other Ambulatory Visit: Payer: Self-pay | Admitting: Adult Health

## 2020-08-21 DIAGNOSIS — Z760 Encounter for issue of repeat prescription: Secondary | ICD-10-CM

## 2020-08-23 ENCOUNTER — Telehealth: Payer: Self-pay | Admitting: Adult Health

## 2020-08-23 NOTE — Telephone Encounter (Signed)
One in the morning and one a night  clonazePAM (KLONOPIN) 0.5 MG tablet    [id number] [place], [place] - [address] Phone:  [phone]  Fax:  [phone]

## 2020-08-25 DIAGNOSIS — J9611 Chronic respiratory failure with hypoxia: Secondary | ICD-10-CM | POA: Diagnosis not present

## 2020-08-25 DIAGNOSIS — J449 Chronic obstructive pulmonary disease, unspecified: Secondary | ICD-10-CM | POA: Diagnosis not present

## 2020-09-05 ENCOUNTER — Other Ambulatory Visit: Payer: Self-pay

## 2020-09-05 ENCOUNTER — Encounter: Payer: Self-pay | Admitting: Adult Health

## 2020-09-05 ENCOUNTER — Ambulatory Visit: Payer: Medicare Other | Admitting: Adult Health

## 2020-09-05 VITALS — BP 128/70 | HR 72 | Temp 97.6°F | Ht 61.0 in | Wt 145.7 lb

## 2020-09-05 DIAGNOSIS — R439 Unspecified disturbances of smell and taste: Secondary | ICD-10-CM | POA: Diagnosis not present

## 2020-09-05 NOTE — Progress Notes (Signed)
Subjective:    Patient ID: [name], female    DOB: July 23, 1936, 84 y.o.   MRN: [id number]  HPI 84 year old female who  has a past medical history of A-fib (HCC), Anemia, Anxiety, Bipolar disorder (HCC), Blood transfusion (1991), CAD (coronary artery disease), Cataract, Chest pain, CHF (congestive heart failure) (HCC) (07/2020), Chronic back pain, COPD (chronic obstructive pulmonary disease) (HCC), Depression, Esophageal stricture, Fibromyalgia, GERD (gastroesophageal reflux disease), Heart murmur, Hiatal hernia, Hyperlipidemia, Hypertension, Internal hemorrhoids, Lumbago, Mitral regurgitation, Osteoarthritis, Osteoporosis, Pneumonia (~ 01/2018), PONV (postoperative nausea and vomiting), Rectal bleeding, Rheumatoid arthritis (HCC), Spondylosis, TIA (transient ischemic attack) (2013), and Urinary incontinence.  She presents to the office for an acute issue of " a constant sweet smell". This started around 07/21/2020 and at that time it was intermittent but as time goes on it has been constant. When she leaves her house she continues to have the " smell of something sweet" whenever I go anywhere.   She has had people over to the house and they have not been able to smell anything   She was diagnosed with COVID in May and lost her taste or smell. She still cannot smell anything besides the sweet smell and cannot taste anything yet     Review of Systems See HPI   Past Medical History:  Diagnosis Date   A-fib (HCC)    Anemia    Anxiety    Bipolar disorder (HCC)    Blood transfusion 1991   autologous pts own blood given    CAD (coronary artery disease)    Cataract    Chest pain    "@ rest, lying down, w/exertion"   CHF (congestive heart failure) (HCC) 07/2020   Chronic back pain    "mostly lower back but I do have upper back pain regularly" (04/06/2018)   COPD (chronic obstructive pulmonary disease) (HCC)    Depression    Esophageal stricture    Fibromyalgia    GERD  (gastroesophageal reflux disease)    Heart murmur    "slight" (04/06/2018)   Hiatal hernia    Hyperlipidemia    Patient denies   Hypertension    Internal hemorrhoids    Lumbago    Mitral regurgitation    Osteoarthritis    Osteoporosis    Pneumonia ~ 01/2018   PONV (postoperative nausea and vomiting)    severe ponv, "in the past" (04/06/2018)   Rectal bleeding    Rheumatoid arthritis (HCC)    Spondylosis    TIA (transient ischemic attack) 2013   Urinary incontinence    wears depends    Social History   Socioeconomic History   Marital status: Widowed    Spouse name: Not on file   Number of children: 2   Years of education: Not on file   Highest education level: Not on file  Occupational History    Employer: RETIRED  Tobacco Use   Smoking status: Never Smoker   Smokeless tobacco: Never Used  Vaping Use   Vaping Use: Never used  Substance and Sexual Activity   Alcohol use: Never   Drug use: Never   Sexual activity: Not Currently    Comment: 1st intercourse 45 yo-1 partner  Other Topics Concern   Not on file  Social History Narrative   Retired    Widowed   Social Determinants of Corporate investment banker Strain:    Difficulty of Paying Living Expenses: Not on file  Food Insecurity:    Worried About  Running Out of Food in the Last Year: Not on file   Ran Out of Food in the Last Year: Not on file  Transportation Needs:    Lack of Transportation (Medical): Not on file   Lack of Transportation (Non-Medical): Not on file  Physical Activity:    Days of Exercise per Week: Not on file   Minutes of Exercise per Session: Not on file  Stress:    Feeling of Stress : Not on file  Social Connections:    Frequency of Communication with Friends and Family: Not on file   Frequency of Social Gatherings with Friends and Family: Not on file   Attends Religious Services: Not on file   Active Member of Clubs or Organizations: Not on file    Attends Banker Meetings: Not on file   Marital Status: Not on file  Intimate Partner Violence:    Fear of Current or Ex-Partner: Not on file   Emotionally Abused: Not on file   Physically Abused: Not on file   Sexually Abused: Not on file    Past Surgical History:  Procedure Laterality Date   ABDOMINAL HYSTERECTOMY  1982   BACK SURGERY     BALLOON DILATION N/A 09/21/2018   Procedure: BALLOON DILATION;  Surgeon: Hilarie Fredrickson, MD;  Location: [facility] ENDOSCOPY;  Service: Endoscopy;  Laterality: N/A;   BALLOON DILATION N/A 08/12/2019   Procedure: BALLOON DILATION;  Surgeon: Lynann Bologna, MD;  Location: [facility] ENDOSCOPY;  Service: Endoscopy;  Laterality: N/A;   BIOPSY  08/12/2019   Procedure: BIOPSY;  Surgeon: Lynann Bologna, MD;  Location: [facility] ENDOSCOPY;  Service: Endoscopy;;   BLADDER SUSPENSION  1980's   BOTOX INJECTION N/A 09/21/2018   Procedure: BOTOX INJECTION;  Surgeon: Hilarie Fredrickson, MD;  Location: [facility] ENDOSCOPY;  Service: Endoscopy;  Laterality: N/A;   BOTOX INJECTION N/A 08/12/2019   Procedure: BOTOX INJECTION;  Surgeon: Lynann Bologna, MD;  Location: [facility] ENDOSCOPY;  Service: Endoscopy;  Laterality: N/A;   CATARACT EXTRACTION W/ INTRAOCULAR LENS  IMPLANT, BILATERAL Bilateral 2010   DILATION AND CURETTAGE OF UTERUS  1961   ESOPHAGEAL MANOMETRY N/A 03/25/2018   Procedure: ESOPHAGEAL MANOMETRY (EM);  Surgeon: Napoleon Form, MD;  Location: [facility] ENDOSCOPY;  Service: Endoscopy;  Laterality: N/A;   ESOPHAGOGASTRODUODENOSCOPY (EGD) WITH PROPOFOL N/A 04/07/2018   Procedure: ESOPHAGOGASTRODUODENOSCOPY (EGD) WITH PROPOFOL;  Surgeon: Sherrilyn Rist, MD;  Location[facility] ENDOSCOPY;  Service: Gastroenterology;  Laterality: N/A;   ESOPHAGOGASTRODUODENOSCOPY (EGD) WITH PROPOFOL N/A 09/21/2018   Procedure: ESOPHAGOGASTRODUODENOSCOPY (EGD) WITH PROPOFOL;  Surgeon: Hilarie Fredrickson, MD;  Location: [facility] ENDOSCOPY;  Service: Endoscopy;  Laterality: N/A;   ESOPHAGOGASTRODUODENOSCOPY  (EGD) WITH PROPOFOL N/A 08/12/2019   Procedure: ESOPHAGOGASTRODUODENOSCOPY (EGD) WITH PROPOFOL;  Surgeon: Lynann Bologna, MD;  Location: [facility] ENDOSCOPY;  Service: Endoscopy;  Laterality: N/A;   HERNIA REPAIR     HIATAL HERNIA REPAIR N/A 08/02/2016   Procedure: LAPAROSCOPIC REPAIR OF LARGE  HIATAL HERNIA;  Surgeon: Glenna Fellows, MD;  Location: [facility] ORS;  Service: General;  Laterality: N/A;   JOINT REPLACEMENT     LAPAROSCOPIC NISSEN FUNDOPLICATION N/A 08/02/2016   Procedure: LAPAROSCOPIC NISSEN FUNDOPLICATION;  Surgeon: Glenna Fellows, MD;  Location: [facility] ORS;  Service: General;  Laterality: N/A;   LUMBAR LAMINECTOMY  1990; 1994; [id number];6160   "I've got 2 stainless steel rods; 6 screws; 2 ray cages"took bone from right hip to put in back   RIGHT/LEFT HEART CATH AND CORONARY ANGIOGRAPHY N/A 07/26/2019   Procedure: RIGHT/LEFT HEART CATH AND  CORONARY ANGIOGRAPHY;  Surgeon: Lyn Records, MD;  Location[facility] INVASIVE CV LAB;  Service: Cardiovascular;  Laterality: N/A;   TEE WITHOUT CARDIOVERSION N/A 08/19/2019   Procedure: TRANSESOPHAGEAL ECHOCARDIOGRAM (TEE);  Surgeon: Lewayne Bunting, MD;  Location: [facility] ENDOSCOPY;  Service: Cardiovascular;  Laterality: N/A;   TONSILLECTOMY AND ADENOIDECTOMY  1945   TOTAL KNEE ARTHROPLASTY Right ~ 1996   TUBAL LIGATION  ~ 1976    Family History  Problem Relation Age of Onset   Kidney disease Mother    Hypertension Mother    Heart disease Father 55       die of MI at age 28   Suicidality Son    Heart disease Brother    Heart attack Brother    Colon cancer Neg Hx    Esophageal cancer Neg Hx    Pancreatic cancer Neg Hx    Rectal cancer Neg Hx    Stomach cancer Neg Hx     Allergies  Allergen Reactions   Morphine Other (See Comments)    Flushing, rash, itching   Tape Other (See Comments)    Band aides, adhesive tape Redness and pulls skin off    Current Outpatient Medications on File Prior to Visit  Medication Sig Dispense Refill    albuterol (VENTOLIN HFA) 108 (90 Base) MCG/ACT inhaler TAKE 2 PUFFS BY MOUTH EVERY 6 HOURS AS NEEDED FOR WHEEZE OR SHORTNESS OF BREATH 4 g 2   amiodarone (PACERONE) 200 MG tablet Take 1 tablet (200 mg total) by mouth daily. 90 tablet 3   amLODipine (NORVASC) 2.5 MG tablet TAKE 1 TABLET BY MOUTH EVERY DAY 90 tablet 3   apixaban (ELIQUIS) 5 MG TABS tablet Take 1 tablet (5 mg total) by mouth 2 (two) times daily. 180 tablet 3   carvedilol (COREG) 6.25 MG tablet Take 1 tablet (6.25 mg total) by mouth 2 (two) times daily with a meal. 180 tablet 3   clonazePAM (KLONOPIN) 0.5 MG tablet TAKE 1 TABLET BY MOUTH TWICE A DAY AS NEEDED FOR ANXIETY 60 tablet 1   DULoxetine (CYMBALTA) 60 MG capsule TAKE 1 CAPSULE BY MOUTH EVERY DAY 90 capsule 0   famotidine (PEPCID) 20 MG tablet Take 20 mg by mouth daily.     furosemide (LASIX) 40 MG tablet TAKE 1 TABLET (40 MG TOTAL) BY MOUTH DAILY AS NEEDED FOR FLUID OR EDEMA. 90 tablet 1   gabapentin (NEURONTIN) 100 MG capsule TAKE 1 CAPSULE BY MOUTH THREE TIMES A DAY 270 capsule 1   HYDROcodone-acetaminophen (NORCO) 5-325 MG tablet Take 1 tablet by mouth 3 (three) times daily as needed for severe pain (for really bad days). 20 tablet 0   omeprazole (PRILOSEC) 20 MG capsule Take 1 capsule (20 mg total) by mouth daily. 90 capsule 3   sacubitril-valsartan (ENTRESTO) 97-103 MG Take 1 tablet by mouth 2 (two) times daily. 60 tablet 6   spironolactone (ALDACTONE) 25 MG tablet Take 1 tablet (25 mg total) by mouth daily. 90 tablet 3   traMADol (ULTRAM) 50 MG tablet Take 2 tablets (100 mg total) by mouth 2 (two) times daily. 120 tablet 5   No current facility-administered medications on file prior to visit.    BP 128/70 (BP Location: Left Arm, Patient Position: Sitting, Cuff Size: Large)    Pulse 72    Temp 97.6 F (36.4 C) (Oral)    Ht 5\' 1"  (1.549 m)    Wt 145 lb 11.2 oz (66.1 kg)    BMI 27.53 kg/m  Objective:   Physical Exam Vitals and nursing note  reviewed.  Constitutional:      Appearance: Normal appearance.  HENT:     Nose: Nose normal. No congestion or rhinorrhea.     Mouth/Throat:     Mouth: Mucous membranes are moist.     Pharynx: Oropharynx is clear.  Musculoskeletal:        General: Normal range of motion.  Skin:    General: Skin is warm and dry.     Capillary Refill: Capillary refill takes less than 2 seconds.  Neurological:     General: No focal deficit present.     Mental Status: She is alert and oriented to person, place, and time.  Psychiatric:        Mood and Affect: Mood normal.        Behavior: Behavior normal.        Thought Content: Thought content normal.        Judgment: Judgment normal.       Assessment & Plan:  1. Olfactory impairment - No signs of sinusitis or mouth infection. No neurological deficits. Unknown cause, possibly regeneration of olefactor senses after Covid exposure? will check Vitamin B 12, Zinc, and TSH. Consider imaging and/or referral to Neurology in the future - Vitamin B12; Future - Zinc; Future - TSH; Future  Shirline Frees, NP

## 2020-09-07 ENCOUNTER — Telehealth: Payer: Self-pay | Admitting: Adult Health

## 2020-09-07 DIAGNOSIS — R519 Headache, unspecified: Secondary | ICD-10-CM

## 2020-09-07 DIAGNOSIS — R439 Unspecified disturbances of smell and taste: Secondary | ICD-10-CM

## 2020-09-07 LAB — VITAMIN B12: Vitamin B-12: 817 pg/mL (ref 200–1100)

## 2020-09-07 LAB — ZINC: Zinc: 71 ug/dL (ref 60–130)

## 2020-09-07 NOTE — Telephone Encounter (Signed)
Spoke to patient informed her of her lab work all of which came back normal.  Due to symptoms and her complaining of pain to the left side of her head after falling multiple months ago will order MRI of the brain

## 2020-09-13 ENCOUNTER — Telehealth: Payer: Self-pay | Admitting: Cardiovascular Disease

## 2020-09-13 DIAGNOSIS — M4326 Fusion of spine, lumbar region: Secondary | ICD-10-CM | POA: Diagnosis not present

## 2020-09-13 DIAGNOSIS — M5136 Other intervertebral disc degeneration, lumbar region: Secondary | ICD-10-CM | POA: Diagnosis not present

## 2020-09-13 DIAGNOSIS — M4185 Other forms of scoliosis, thoracolumbar region: Secondary | ICD-10-CM | POA: Diagnosis not present

## 2020-09-13 NOTE — Telephone Encounter (Signed)
Tried to call patient back, there was no answer and voicemail was full.

## 2020-09-13 NOTE — Telephone Encounter (Signed)
Sent mychart message

## 2020-09-13 NOTE — Telephone Encounter (Signed)
Patient is requesting to hold apixaban (ELIQUIS) 5 MG TABS tablet for 3 days prior to her spinal injection that is scheduled for 09/23/2020. She would like a call to know if this is okay.   Please call/advise. Thank you!

## 2020-09-14 DIAGNOSIS — M1712 Unilateral primary osteoarthritis, left knee: Secondary | ICD-10-CM | POA: Diagnosis not present

## 2020-09-14 DIAGNOSIS — M25562 Pain in left knee: Secondary | ICD-10-CM | POA: Diagnosis not present

## 2020-09-14 DIAGNOSIS — M25561 Pain in right knee: Secondary | ICD-10-CM | POA: Diagnosis not present

## 2020-09-20 ENCOUNTER — Telehealth: Payer: Self-pay | Admitting: Cardiovascular Disease

## 2020-09-20 NOTE — Telephone Encounter (Signed)
° °  [facility] Pre-operative Risk Assessment    Request for surgical clearance:  1. What type of surgery is being performed? Spinal injection bilateral S1 Tsesi  2. When is this surgery scheduled?  09/26/20  3. What type of clearance is required (medical clearance vs. Pharmacy clearance to hold med vs. Both)?  Medical   4. Are there any medications that need to be held prior to surgery and how long? apixaban (ELIQUIS) 5 MG TABS tablet  5. Practice name and name of physician performing surgery? [facility]   6. What is your office phone number [phone]   7.   What is your office fax number? [phone]  8.   Anesthesia type (None, local, MAC, general) ? None    Kaitlyn Cornell 09/20/2020, 4:09 PM  _________________________________________________________________   (provider comments below)

## 2020-09-21 NOTE — Telephone Encounter (Signed)
Ok to hold for 3 days.

## 2020-09-21 NOTE — Telephone Encounter (Signed)
   Primary Cardiologist: Charlton Haws, MD  Chart reviewed and patient contacted by phone today as part of pre-operative protocol coverage. Given past medical history and time since last visit, based on ACC/AHA guidelines, Asiya C Karabin would be at acceptable risk for the planned procedure without further cardiovascular testing.   OK to hold Eliquis 3 days pre op.  I have relayed these instructions to the patient today.   The patient was advised that if she develops new symptoms prior to surgery to contact our office to arrange for a follow-up visit, and she verbalized understanding.  I will route this recommendation to the requesting party via Epic fax function and remove from pre-op pool.  Please call with questions.  Corine Shelter, PA-C 09/21/2020, 8:59 AM

## 2020-09-21 NOTE — Telephone Encounter (Signed)
Patient with diagnosis of afib on Eliquis for anticoagulation.    Procedure: Spinal injection bilateral S1 Tsesi Date of procedure: 09/26/20  CHA2DS2-VASc Score = 8  This indicates a 10.8% annual risk of stroke. The patient's score is based upon: CHF History: 1 HTN History: 1 Diabetes History: 0 Stroke History: 2 Vascular Disease History: 1 Age Score: 2 Gender Score: 1  CrCl 57mL/min Platelet count 290K  3 day Eliquis hold is typically required for spinal procedures. Due to elevated cardiovascular risk, will defer to MD for input.

## 2020-09-22 ENCOUNTER — Encounter: Payer: Medicare Other | Admitting: Physical Medicine and Rehabilitation

## 2020-09-24 ENCOUNTER — Other Ambulatory Visit: Payer: Self-pay | Admitting: Adult Health

## 2020-09-24 ENCOUNTER — Other Ambulatory Visit: Payer: Self-pay | Admitting: Physical Medicine and Rehabilitation

## 2020-09-25 ENCOUNTER — Other Ambulatory Visit: Payer: Self-pay

## 2020-09-25 DIAGNOSIS — J449 Chronic obstructive pulmonary disease, unspecified: Secondary | ICD-10-CM | POA: Diagnosis not present

## 2020-09-25 DIAGNOSIS — J9611 Chronic respiratory failure with hypoxia: Secondary | ICD-10-CM | POA: Diagnosis not present

## 2020-09-25 MED ORDER — APIXABAN 5 MG PO TABS
5.0000 mg | ORAL_TABLET | Freq: Two times a day (BID) | ORAL | 1 refills | Status: DC
Start: 2020-09-25 — End: 2021-05-08

## 2020-09-25 NOTE — Telephone Encounter (Signed)
Pt last saw Dr Eden Emms 07/14/20, last labs 06/30/20 Creat 0.83, age 84, weight 66.1kg, based on specified criteria pt is on appropriate dosage of Eliquis 5mg  BID.  Will refill rx.

## 2020-09-27 ENCOUNTER — Other Ambulatory Visit: Payer: Self-pay

## 2020-09-27 ENCOUNTER — Ambulatory Visit
Admission: RE | Admit: 2020-09-27 | Discharge: 2020-09-27 | Disposition: A | Discharge status: Home / Self Care | Payer: Medicare Other | Source: Ambulatory Visit | Attending: Adult Health | Admitting: Adult Health

## 2020-09-27 DIAGNOSIS — G319 Degenerative disease of nervous system, unspecified: Secondary | ICD-10-CM | POA: Diagnosis not present

## 2020-09-27 DIAGNOSIS — R439 Unspecified disturbances of smell and taste: Secondary | ICD-10-CM

## 2020-09-27 DIAGNOSIS — S0990XA Unspecified injury of head, initial encounter: Secondary | ICD-10-CM | POA: Diagnosis not present

## 2020-09-27 DIAGNOSIS — G9389 Other specified disorders of brain: Secondary | ICD-10-CM | POA: Diagnosis not present

## 2020-09-27 DIAGNOSIS — G8929 Other chronic pain: Secondary | ICD-10-CM | POA: Diagnosis not present

## 2020-09-27 DIAGNOSIS — R519 Headache, unspecified: Secondary | ICD-10-CM

## 2020-09-30 ENCOUNTER — Other Ambulatory Visit: Payer: Medicare Other

## 2020-10-10 DIAGNOSIS — M5116 Intervertebral disc disorders with radiculopathy, lumbar region: Secondary | ICD-10-CM | POA: Diagnosis not present

## 2020-10-10 IMAGING — DX DG CHEST 2V
2 series · 2 of 2 positions shown · non-contrast
Comparison: 10/20/2018 and earlier.

CLINICAL DATA: Follow-up RIGHT LOWER LOBE pneumonia.

EXAM:
CHEST - 2 VIEW

[chest pa]
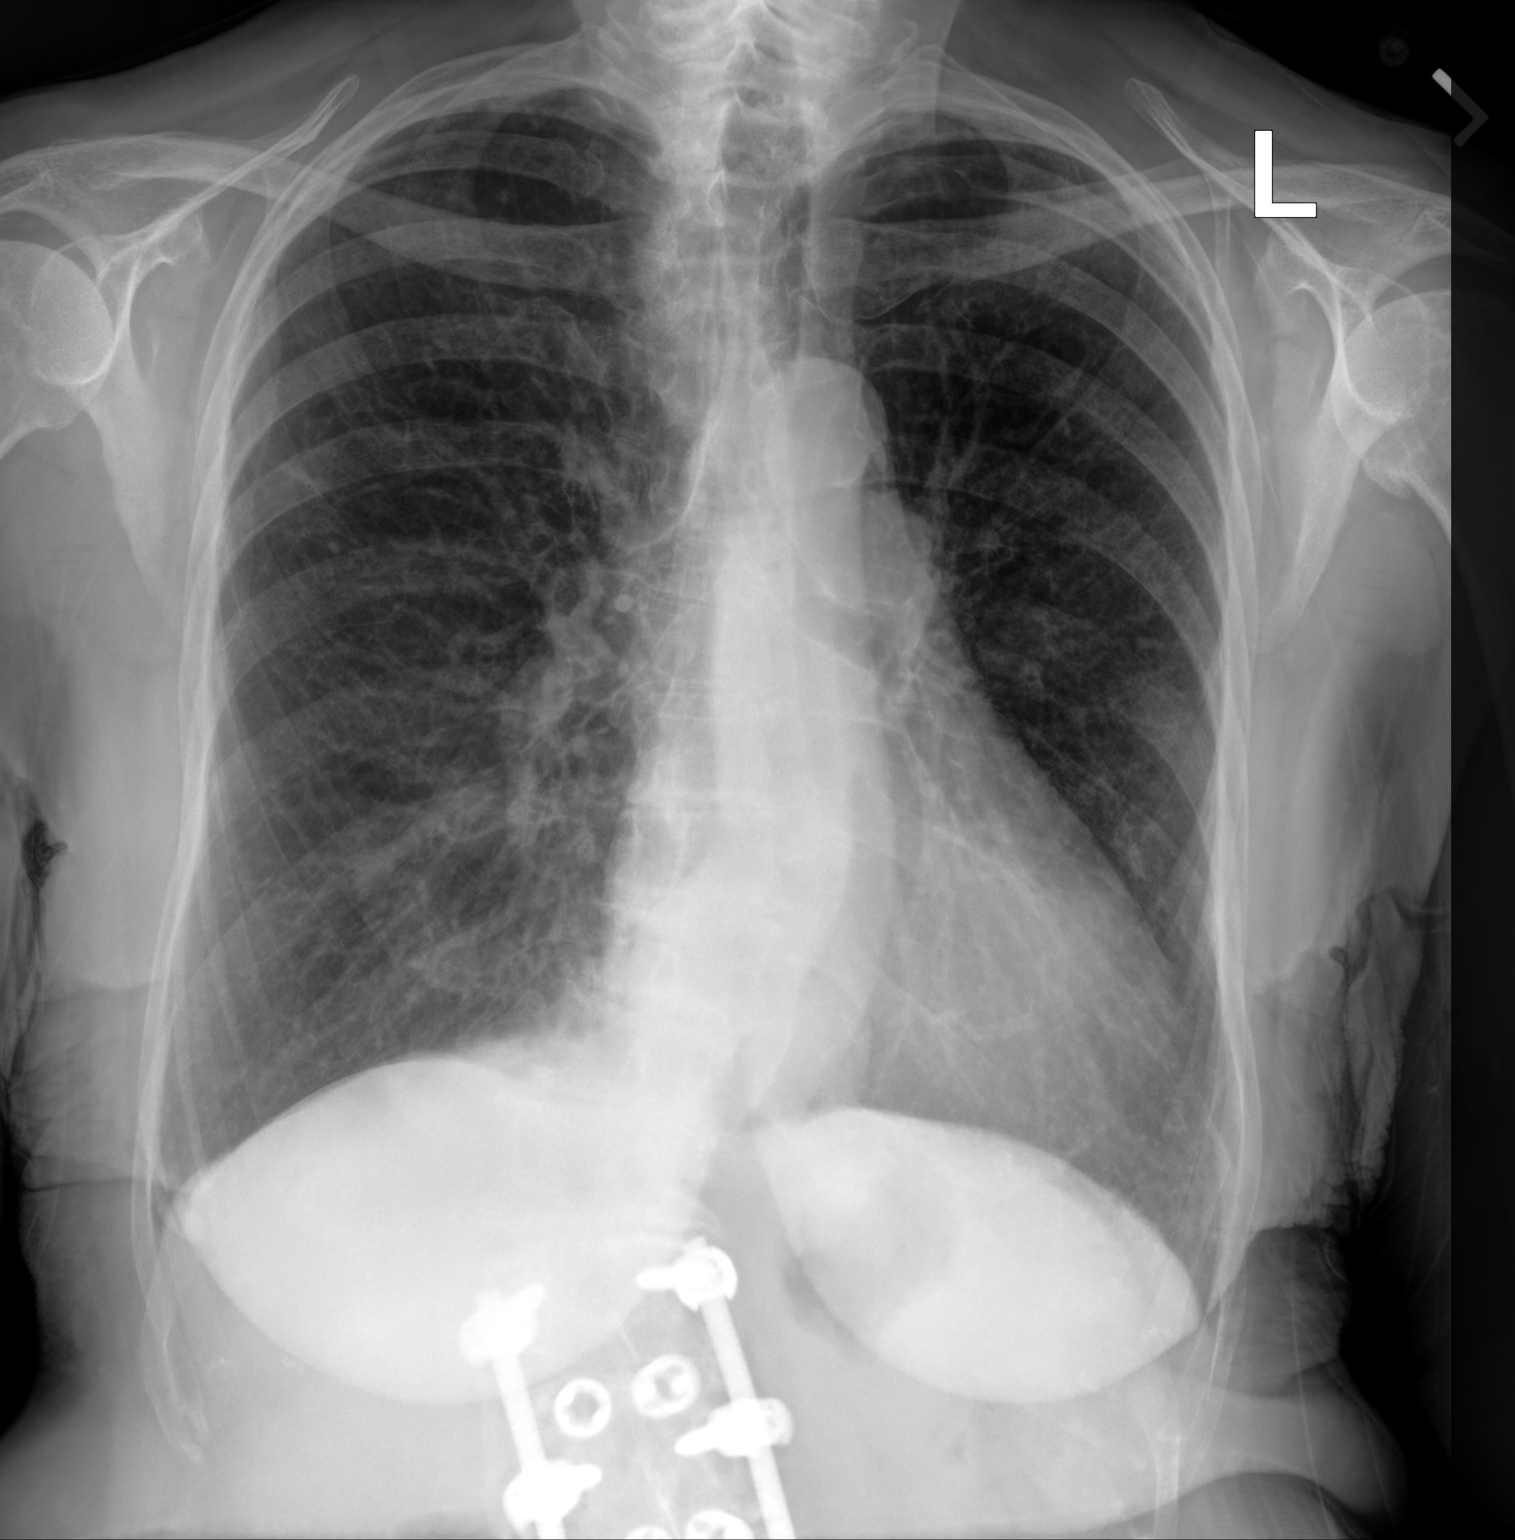

[chest lat]
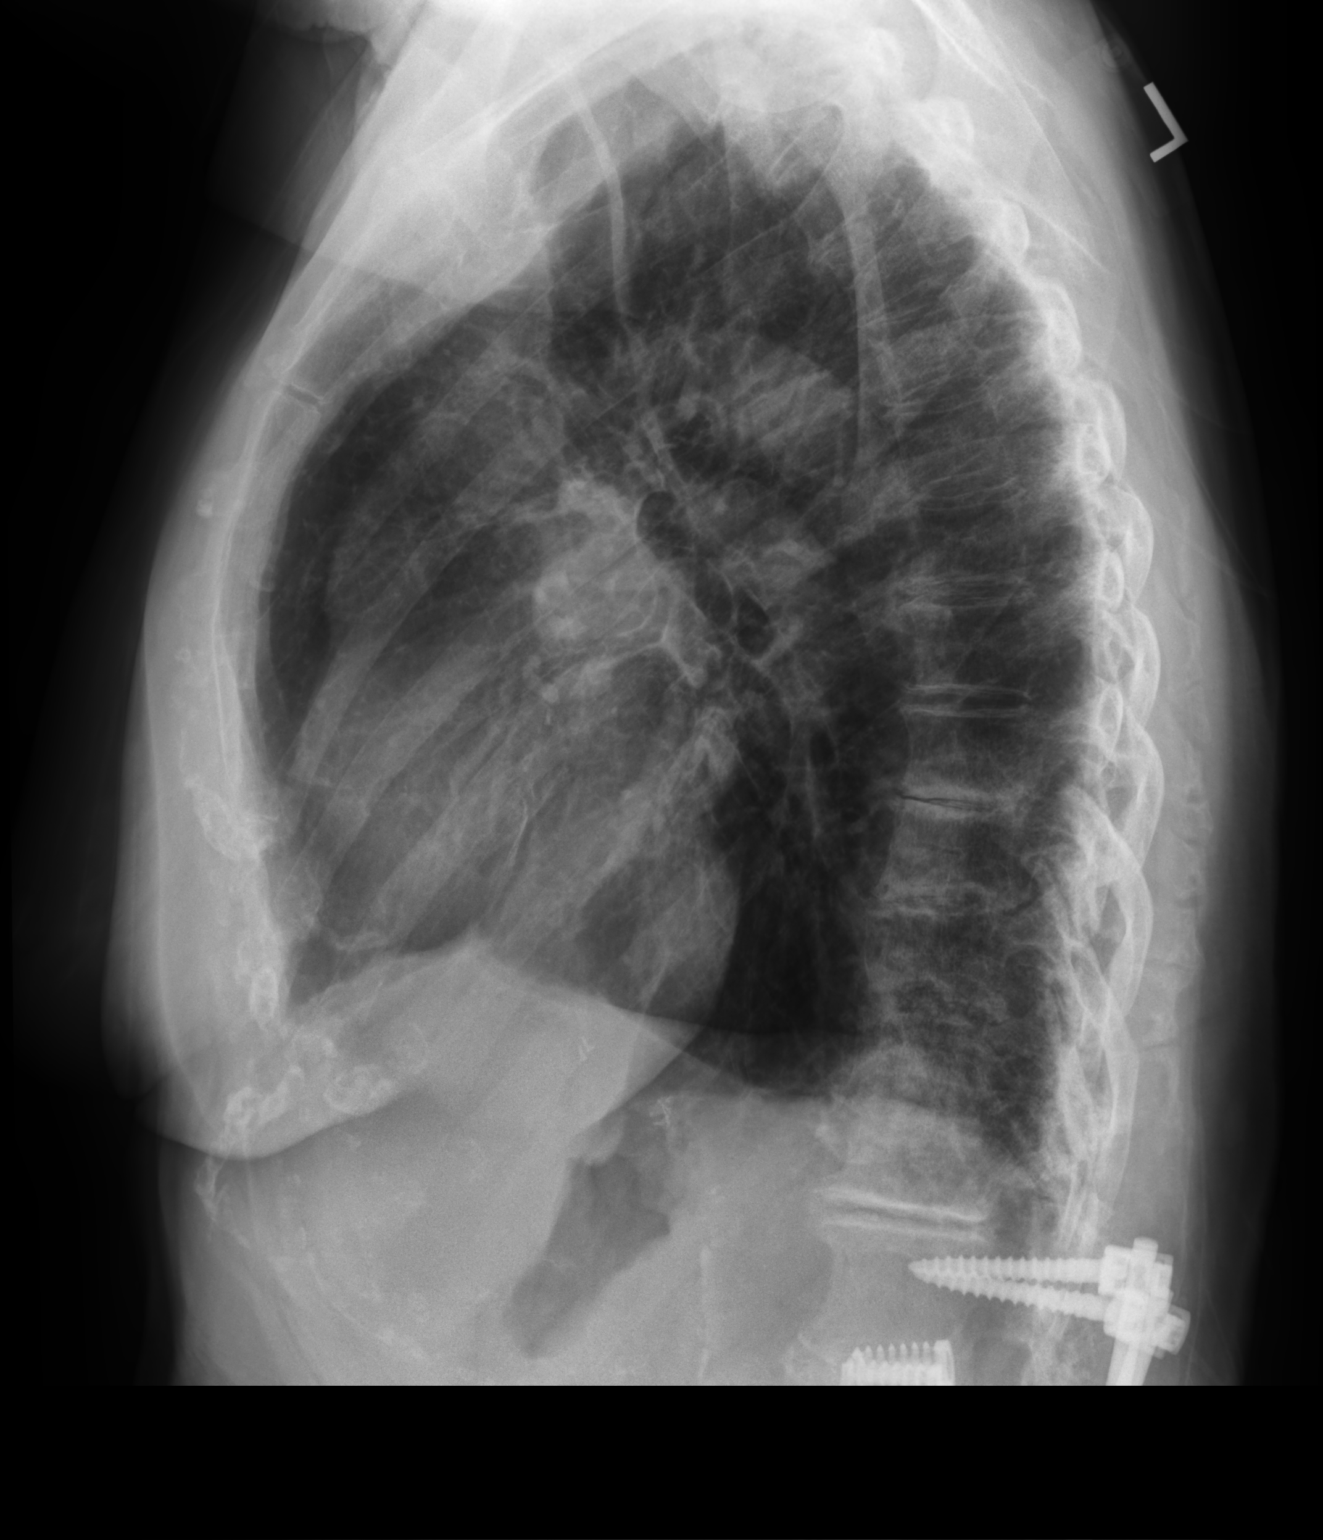

[2 of 2 positions shown; findings below may reference images not displayed]

FINDINGS: Cardiac silhouette mildly enlarged, unchanged. Thoracic aorta mildly
atherosclerotic, unchanged. Hilar and mediastinal contours otherwise
unremarkable. Prominent bronchovascular markings diffusely and
central peribronchial thickening, unchanged. Airspace consolidation
posteromedially in the RIGHT LOWER LOBE, improved since the
10/20/2018 examination, though not yet completely resolved
radiographically. Lungs otherwise clear. No pleural effusions.
Severe degenerative changes involving the LOWER thoracic spine,
thoracolumbar dextroscoliosis at prior multilevel lumbar fusion
again noted.
IMPRESSION: 1. Improving pneumonia involving the RIGHT LOWER LOBE, though not
yet completely resolved radiographically.
2. Stable mild cardiomegaly without pulmonary edema.
3. Stable changes of chronic bronchitis and/or asthma.

## 2020-10-12 ENCOUNTER — Telehealth: Payer: Self-pay | Admitting: Cardiovascular Disease

## 2020-10-12 NOTE — Telephone Encounter (Signed)
Pt called in and stated she is in the donut whole and her apixaban (ELIQUIS) 5 MG TABS tablet [[id number] Is over $500.00 .  She has been out of this med for 2 weeks ,  She was going to try to make it to the 1st of the year without but she is starting to not feel good and would like to know what her options are ?    [phone]

## 2020-10-12 NOTE — Telephone Encounter (Signed)
Will send to Dr. Fabio Bering nurse Elita Quick. Will send to our medication asst prgm nurse/cma to see if the pt may qualify for assistance.

## 2020-10-12 NOTE — Telephone Encounter (Signed)
Called pt to inform her that I will be leaving 2 weeks supply of Eliquis 5 mg tablet and the patient assistance program information, for pt to call to get an application for a tier reduction. I advised pt that if she has any other problems, questions or concerns, please give our office a call. Pt verbalized understanding. Lot: [id number] Exp: 12/2022  ZJQ

## 2020-10-12 NOTE — Telephone Encounter (Signed)
Will see if patient can get samples until issues are resolved.

## 2020-10-13 ENCOUNTER — Encounter: Payer: Self-pay | Admitting: Physical Medicine and Rehabilitation

## 2020-10-13 ENCOUNTER — Encounter
Payer: Medicare Other | Attending: Physical Medicine and Rehabilitation | Admitting: Physical Medicine and Rehabilitation

## 2020-10-13 ENCOUNTER — Other Ambulatory Visit: Payer: Self-pay

## 2020-10-13 VITALS — BP 102/69 | HR 92 | Temp 97.7°F | Ht 61.0 in | Wt 153.8 lb

## 2020-10-13 DIAGNOSIS — G894 Chronic pain syndrome: Secondary | ICD-10-CM | POA: Diagnosis not present

## 2020-10-13 DIAGNOSIS — M7918 Myalgia, other site: Secondary | ICD-10-CM | POA: Diagnosis not present

## 2020-10-13 DIAGNOSIS — M069 Rheumatoid arthritis, unspecified: Secondary | ICD-10-CM | POA: Diagnosis not present

## 2020-10-13 DIAGNOSIS — M797 Fibromyalgia: Secondary | ICD-10-CM | POA: Insufficient documentation

## 2020-10-13 NOTE — Patient Instructions (Signed)
Plan: 1. Trigger point injections- for myofascial pain/chronic back pain.  Patient here for trigger point injections for  Consent done and on chart.  Cleaned areas with alcohol and injected using a 27 gauge 1.5 inch needle  Injected 5cc Using 1% Lidocaine with no EPI  Upper traps B/L Levators B/L Posterior scalenes B/L Middle scalenes Splenius Capitus B/L Pectoralis Major B/L Rhomboids B/L Infraspinatus Teres Major/minor Thoracic paraspinals Lumbar paraspinals Other injections-    Patient's level of pain prior was 5/10 Current level of pain after injections is 3/10 and can straighten up a lot better.   There was no bleeding or complications.  Patient was advised to drink a lot of water on day after injections to flush system Will have increased soreness for 12-48 hours after injections.  Can use Lidocaine patches the day AFTER injections Can use theracane on day of injections in places didn't inject Can use heating pad 4-6 hours AFTER injections   2. Use tennis ball on dining room chair with cookie sheet- better than wall.  Since keeps dropping tennis ball on floor- no new falls- use theracane on muscles marked above.   3. con't using four pronged cane to walk to reduce chance of falls.   4. F/u in 2 months for trigger point injections   5. Can try back straightening "brace"-posture brace to help symptoms, not fix problem

## 2020-10-13 NOTE — Progress Notes (Signed)
Patient is an 84 yr old female with hx of . Moderate or severe neural foraminal stenosis at the left T1, right T6, and bilateral T9 through T12 nerve levelswith secondary myofascial pain.Also with Afib, on Amiodarone/ and Eliquis.    O2 sats 81%- SOB- on O2 at night- feeling SOB.  Gets SOB from going to mailbox and back.  Never a smoker sats came up to 97% and sats came back down to 70s.   Just got epidural steroid injections-2 days ago- Were done in mid to low back.   Pain better than it was, but upper back still a problem.    Uses theracane and tennis ball.  Injections last 3-4 weeks- when starts to come back, then ramps back up.   Assessment: Chronic pain syndrome - with myofascial pain- and fibromyalgia. Also has kyphosis/scoliosis  Plan: 1. Trigger point injections- for myofascial pain/chronic back pain.  Patient here for trigger point injections for  Consent done and on chart.  Cleaned areas with alcohol and injected using a 27 gauge 1.5 inch needle  Injected 5cc Using 1% Lidocaine with no EPI  Upper traps B/L Levators B/L Posterior scalenes B/L Middle scalenes Splenius Capitus B/L Pectoralis Major B/L Rhomboids B/L Infraspinatus Teres Major/minor Thoracic paraspinals Lumbar paraspinals Other injections-    Patient's level of pain prior was 5/10 Current level of pain after injections is 3/10 and can straighten up a lot better.   There was no bleeding or complications.  Patient was advised to drink a lot of water on day after injections to flush system Will have increased soreness for 12-48 hours after injections.  Can use Lidocaine patches the day AFTER injections Can use theracane on day of injections in places didn't inject Can use heating pad 4-6 hours AFTER injections   2. Use tennis ball on dining room chair with cookie sheet- better than wall.  Since keeps dropping tennis ball on floor- no new falls  3. con't using four pronged cane to walk  to reduce chance of falls.   4. F/u in 2 months for trigger point injections   5. Can try back straightening "brace"-posture brace to help symptoms, not fix problem   I spent a total of 30 minutes on visit as detailed above.

## 2020-10-14 ENCOUNTER — Emergency Department (HOSPITAL_COMMUNITY): Payer: Medicare Other

## 2020-10-14 ENCOUNTER — Other Ambulatory Visit: Payer: Self-pay

## 2020-10-14 ENCOUNTER — Observation Stay (HOSPITAL_COMMUNITY)
Admission: EM | Admit: 2020-10-14 | Discharge: 2020-10-15 | Disposition: A | Discharge status: Home / Self Care | Payer: Medicare Other | Attending: Cardiovascular Disease | Admitting: Cardiovascular Disease

## 2020-10-14 DIAGNOSIS — R52 Pain, unspecified: Secondary | ICD-10-CM

## 2020-10-14 DIAGNOSIS — I2510 Atherosclerotic heart disease of native coronary artery without angina pectoris: Secondary | ICD-10-CM | POA: Insufficient documentation

## 2020-10-14 DIAGNOSIS — R000 Tachycardia, unspecified: Secondary | ICD-10-CM | POA: Diagnosis not present

## 2020-10-14 DIAGNOSIS — Z20822 Contact with and (suspected) exposure to covid-19: Secondary | ICD-10-CM | POA: Insufficient documentation

## 2020-10-14 DIAGNOSIS — Z7901 Long term (current) use of anticoagulants: Secondary | ICD-10-CM | POA: Insufficient documentation

## 2020-10-14 DIAGNOSIS — Z8673 Personal history of transient ischemic attack (TIA), and cerebral infarction without residual deficits: Secondary | ICD-10-CM | POA: Insufficient documentation

## 2020-10-14 DIAGNOSIS — R079 Chest pain, unspecified: Secondary | ICD-10-CM | POA: Diagnosis not present

## 2020-10-14 DIAGNOSIS — I110 Hypertensive heart disease with heart failure: Secondary | ICD-10-CM | POA: Insufficient documentation

## 2020-10-14 DIAGNOSIS — I472 Ventricular tachycardia: Secondary | ICD-10-CM | POA: Diagnosis not present

## 2020-10-14 DIAGNOSIS — J45909 Unspecified asthma, uncomplicated: Secondary | ICD-10-CM | POA: Diagnosis not present

## 2020-10-14 DIAGNOSIS — J449 Chronic obstructive pulmonary disease, unspecified: Secondary | ICD-10-CM | POA: Insufficient documentation

## 2020-10-14 DIAGNOSIS — I447 Left bundle-branch block, unspecified: Secondary | ICD-10-CM | POA: Diagnosis not present

## 2020-10-14 DIAGNOSIS — Z23 Encounter for immunization: Secondary | ICD-10-CM | POA: Insufficient documentation

## 2020-10-14 DIAGNOSIS — E039 Hypothyroidism, unspecified: Secondary | ICD-10-CM | POA: Diagnosis not present

## 2020-10-14 DIAGNOSIS — Z79899 Other long term (current) drug therapy: Secondary | ICD-10-CM | POA: Diagnosis not present

## 2020-10-14 DIAGNOSIS — Z760 Encounter for issue of repeat prescription: Secondary | ICD-10-CM

## 2020-10-14 DIAGNOSIS — E785 Hyperlipidemia, unspecified: Secondary | ICD-10-CM | POA: Diagnosis present

## 2020-10-14 DIAGNOSIS — Z96651 Presence of right artificial knee joint: Secondary | ICD-10-CM | POA: Diagnosis not present

## 2020-10-14 DIAGNOSIS — I480 Paroxysmal atrial fibrillation: Secondary | ICD-10-CM | POA: Diagnosis present

## 2020-10-14 DIAGNOSIS — I4891 Unspecified atrial fibrillation: Secondary | ICD-10-CM | POA: Insufficient documentation

## 2020-10-14 DIAGNOSIS — I4581 Long QT syndrome: Secondary | ICD-10-CM | POA: Diagnosis not present

## 2020-10-14 DIAGNOSIS — I10 Essential (primary) hypertension: Secondary | ICD-10-CM | POA: Diagnosis present

## 2020-10-14 DIAGNOSIS — I509 Heart failure, unspecified: Secondary | ICD-10-CM | POA: Diagnosis not present

## 2020-10-14 DIAGNOSIS — R0789 Other chest pain: Secondary | ICD-10-CM | POA: Diagnosis not present

## 2020-10-14 LAB — CBC WITH DIFFERENTIAL/PLATELET
Abs Immature Granulocytes: 0.08 10*3/uL — ABNORMAL HIGH (ref 0.00–0.07)
Basophils Absolute: 0.1 10*3/uL (ref 0.0–0.1)
Basophils Relative: 1 %
Eosinophils Absolute: 0.3 10*3/uL (ref 0.0–0.5)
Eosinophils Relative: 2 %
HCT: 38.4 % (ref 36.0–46.0)
Hemoglobin: 12.2 g/dL (ref 12.0–15.0)
Immature Granulocytes: 1 %
Lymphocytes Relative: 25 %
Lymphs Abs: 3.1 10*3/uL (ref 0.7–4.0)
MCH: 31.6 pg (ref 26.0–34.0)
MCHC: 31.8 g/dL (ref 30.0–36.0)
MCV: 99.5 fL (ref 80.0–100.0)
Monocytes Absolute: 0.8 10*3/uL (ref 0.1–1.0)
Monocytes Relative: 7 %
Neutro Abs: 8.2 10*3/uL — ABNORMAL HIGH (ref 1.7–7.7)
Neutrophils Relative %: 64 %
Platelets: 335 10*3/uL (ref 150–400)
RBC: 3.86 MIL/uL — ABNORMAL LOW (ref 3.87–5.11)
RDW: 13.3 % (ref 11.5–15.5)
WBC: 12.6 10*3/uL — ABNORMAL HIGH (ref 4.0–10.5)
nRBC: 0 % (ref 0.0–0.2)

## 2020-10-14 LAB — I-STAT CHEM 8, ED
BUN: 28 mg/dL — ABNORMAL HIGH (ref 8–23)
Calcium, Ion: 1.15 mmol/L (ref 1.15–1.40)
Chloride: 107 mmol/L (ref 98–111)
Creatinine, Ser: 1 mg/dL (ref 0.44–1.00)
Glucose, Bld: 85 mg/dL (ref 70–99)
HCT: 40 % (ref 36.0–46.0)
Hemoglobin: 13.6 g/dL (ref 12.0–15.0)
Potassium: 4.8 mmol/L (ref 3.5–5.1)
Sodium: 139 mmol/L (ref 135–145)
TCO2: 24 mmol/L (ref 22–32)

## 2020-10-14 LAB — RESP PANEL BY RT-PCR (FLU A&B, COVID) ARPGX2
Influenza A by PCR: NEGATIVE
Influenza B by PCR: NEGATIVE
SARS Coronavirus 2 by RT PCR: NEGATIVE

## 2020-10-14 LAB — BRAIN NATRIURETIC PEPTIDE: B Natriuretic Peptide: 100.4 pg/mL — ABNORMAL HIGH (ref 0.0–100.0)

## 2020-10-14 MED ORDER — SODIUM CHLORIDE 0.9 % IV BOLUS
1000.0000 mL | Freq: Once | INTRAVENOUS | Status: AC
  Administered 2020-10-14: 1000 mL via INTRAVENOUS

## 2020-10-14 MED ORDER — ETOMIDATE 2 MG/ML IV SOLN
INTRAVENOUS | Status: AC | PRN
  Administered 2020-10-14: 6.98 mg via INTRAVENOUS

## 2020-10-14 MED ORDER — ETOMIDATE 2 MG/ML IV SOLN
0.1000 mg/kg | Freq: Once | INTRAVENOUS | Status: DC
  Filled 2020-10-14: qty 10

## 2020-10-14 NOTE — ED Notes (Signed)
Called RT to bedisde for cardioversion. Pt verbalized understanding. Zoll pads on pt at this time. Pt placed on O2.

## 2020-10-14 NOTE — ED Provider Notes (Signed)
[facility] EMERGENCY DEPARTMENT Provider Note   CSN: [id number] Arrival date & time: 10/14/20  2149     History Chief Complaint  Patient presents with  . Chest Pain    l    LEVEL 5 CAVEAT 2/2 ACUITY OF CONDITION  Julie Rice is a 84 y.o. female.  84 year old female with history of CAD, hypertension, COPD, atrial fibrillation (chronically anticoagulated on Eliquis), TIA, COPD presents to the emergency department for evaluation of chest pain.  She reports onset of chest pain at 1700.  She describes a substernal pressure-like, burning sensation that radiates to bilateral shoulders and down both her arms.  Her pain has been constant since it began and associated with lightheadedness, diaphoresis.  She has not had any back pain, nausea, vomiting, syncope, extremity numbness or paresthesias, extremity weakness, abdominal pain, recent fevers.  She did miss 3 days of her Eliquis after running out of this prescription; restarted yesterday and has taken a total of 3 doses since then.  Cardiology - Dr. Eden Emms   Chest Pain     Past Medical History:  Diagnosis Date  . A-fib (HCC)   . Anemia   . Anxiety   . Bipolar disorder (HCC)   . Blood transfusion 1991   autologous pts own blood given   . CAD (coronary artery disease)   . Cataract   . Chest pain    "@ rest, lying down, w/exertion"  . CHF (congestive heart failure) (HCC) 07/2020  . Chronic back pain    "mostly lower back but I do have upper back pain regularly" (04/06/2018)  . COPD (chronic obstructive pulmonary disease) (HCC)   . Depression   . Esophageal stricture   . Fibromyalgia   . GERD (gastroesophageal reflux disease)   . Heart murmur    "slight" (04/06/2018)  . Hiatal hernia   . Hyperlipidemia    Patient denies  . Hypertension   . Internal hemorrhoids   . Lumbago   . Mitral regurgitation   . Osteoarthritis   . Osteoporosis   . Pneumonia ~ 01/2018  . PONV (postoperative nausea and vomiting)     severe ponv, "in the past" (04/06/2018)  . Rectal bleeding   . Rheumatoid arthritis (HCC)   . Spondylosis   . TIA (transient ischemic attack) 2013  . Urinary incontinence    wears depends    Patient Active Problem List   Diagnosis Date Noted  . Chronic pain syndrome 02/16/2020  . Fibromyalgia 02/16/2020  . Myofascial pain 02/16/2020  . Rheumatoid arthritis involving multiple sites (HCC) 02/16/2020  . Pleural effusion, bilateral 09/08/2019  . Secondary cardiomyopathy (HCC)   . Elevated troponin 08/17/2019  . Chronic respiratory failure with hypoxia (HCC) 08/16/2019  . COPD (chronic obstructive pulmonary disease) (HCC) 08/16/2019  . Atrial fibrillation with RVR (HCC) 08/16/2019  . Community acquired pneumonia of left lung 08/07/2019  . Prolonged QT interval 08/07/2019  . Sepsis (HCC) 08/07/2019  . Acute on chronic respiratory failure with hypoxia (HCC) 08/07/2019  . CHF (congestive heart failure) (HCC) 07/24/2019  . Chest pain   . DOE (dyspnea on exertion)   . Asthma 04/22/2019  . Abnormal CT of the chest 02/23/2019  . Esophageal motility disorder   . Esophageal dysmotility   . Loss of weight   . Moderate protein-calorie malnutrition (HCC)   . Hematemesis 04/06/2018  . Periesophageal hiatal hernia 08/02/2016  . Dysphagia 05/10/2016  . Esophageal stricture 05/10/2016  . Epigastric pain 05/10/2016  . Generalized anxiety disorder  02/06/2012  . Major depressive disorder, recurrent (HCC) 02/05/2012  . Hypokalemia 11/09/2011  . Nausea and vomiting in adult 11/08/2011  . Dehydration 11/08/2011  . Tremors of nervous system 11/08/2011  . Hypothyroidism 05/10/2008  . Dyslipidemia 05/10/2008  . UNSPECIFIED ANEMIA 05/10/2008  . CAROTID ARTERY DISEASE 05/10/2008  . Transient cerebral ischemia 05/10/2008  . Chronic back pain 05/10/2008  . OSTEOPENIA 05/10/2008  . Dyspnea 05/10/2008  . Upper airway cough syndrome 05/10/2008  . ESOPHAGEAL STRICTURE 01/19/2008  . Essential  hypertension 04/29/2007  . Hiatal hernia with gastroesophageal reflux 04/29/2007  . Osteoarthritis 04/29/2007  . SPONDYLOSIS 04/29/2007    Past Surgical History:  Procedure Laterality Date  . ABDOMINAL HYSTERECTOMY  1982  . BACK SURGERY    . BALLOON DILATION N/A 09/21/2018   Procedure: BALLOON DILATION;  Surgeon: Hilarie Fredrickson, MD;  Location: [facility] ENDOSCOPY;  Service: Endoscopy;  Laterality: N/A;  . BALLOON DILATION N/A 08/12/2019   Procedure: BALLOON DILATION;  Surgeon: Lynann Bologna, MD;  Location: [facility] ENDOSCOPY;  Service: Endoscopy;  Laterality: N/A;  . BIOPSY  08/12/2019   Procedure: BIOPSY;  Surgeon: Lynann Bologna, MD;  Location: [facility] ENDOSCOPY;  Service: Endoscopy;;  . BLADDER SUSPENSION  1980's  . BOTOX INJECTION N/A 09/21/2018   Procedure: BOTOX INJECTION;  Surgeon: Hilarie Fredrickson, MD;  Location: [facility] ENDOSCOPY;  Service: Endoscopy;  Laterality: N/A;  . BOTOX INJECTION N/A 08/12/2019   Procedure: BOTOX INJECTION;  Surgeon: Lynann Bologna, MD;  Location: [facility] ENDOSCOPY;  Service: Endoscopy;  Laterality: N/A;  . CATARACT EXTRACTION W/ INTRAOCULAR LENS  IMPLANT, BILATERAL Bilateral 2010  . DILATION AND CURETTAGE OF UTERUS  1961  . ESOPHAGEAL MANOMETRY N/A 03/25/2018   Procedure: ESOPHAGEAL MANOMETRY (EM);  Surgeon: Napoleon Form, MD;  Location: [facility] ENDOSCOPY;  Service: Endoscopy;  Laterality: N/A;  . ESOPHAGOGASTRODUODENOSCOPY (EGD) WITH PROPOFOL N/A 04/07/2018   Procedure: ESOPHAGOGASTRODUODENOSCOPY (EGD) WITH PROPOFOL;  Surgeon: Sherrilyn Rist, MD;  Location: [facility] ENDOSCOPY;  Service: Gastroenterology;  Laterality: N/A;  . ESOPHAGOGASTRODUODENOSCOPY (EGD) WITH PROPOFOL N/A 09/21/2018   Procedure: ESOPHAGOGASTRODUODENOSCOPY (EGD) WITH PROPOFOL;  Surgeon: Hilarie Fredrickson, MD;  Location: [facility] ENDOSCOPY;  Service: Endoscopy;  Laterality: N/A;  . ESOPHAGOGASTRODUODENOSCOPY (EGD) WITH PROPOFOL N/A 08/12/2019   Procedure: ESOPHAGOGASTRODUODENOSCOPY (EGD) WITH PROPOFOL;  Surgeon: Lynann Bologna, MD;   Location: [facility] ENDOSCOPY;  Service: Endoscopy;  Laterality: N/A;  . HERNIA REPAIR    . HIATAL HERNIA REPAIR N/A 08/02/2016   Procedure: LAPAROSCOPIC REPAIR OF LARGE  HIATAL HERNIA;  Surgeon: Glenna Fellows, MD;  Location: [facility] ORS;  Service: General;  Laterality: N/A;  . JOINT REPLACEMENT    . LAPAROSCOPIC NISSEN FUNDOPLICATION N/A 08/02/2016   Procedure: LAPAROSCOPIC NISSEN FUNDOPLICATION;  Surgeon: Glenna Fellows, MD;  Location: [facility] ORS;  Service: General;  Laterality: N/A;  . LUMBAR LAMINECTOMY  1990; 1994; [id number]   "I've got 2 stainless steel rods; 6 screws; 2 ray cages"took bone from right hip to put in back  . RIGHT/LEFT HEART CATH AND CORONARY ANGIOGRAPHY N/A 07/26/2019   Procedure: RIGHT/LEFT HEART CATH AND CORONARY ANGIOGRAPHY;  Surgeon: Lyn Records, MD;  Location: [facility] INVASIVE CV LAB;  Service: Cardiovascular;  Laterality: N/A;  . TEE WITHOUT CARDIOVERSION N/A 08/19/2019   Procedure: TRANSESOPHAGEAL ECHOCARDIOGRAM (TEE);  Surgeon: Lewayne Bunting, MD;  Location: [facility] ENDOSCOPY;  Service: Cardiovascular;  Laterality: N/A;  . TONSILLECTOMY AND ADENOIDECTOMY  1945  . TOTAL KNEE ARTHROPLASTY Right ~ 1996  . TUBAL LIGATION  ~ 1976     OB History  Gravida  3   Para  2   Term      Preterm      AB  1   Living  0     SAB  1   IAB      Ectopic      Multiple      Live Births              Family History  Problem Relation Age of Onset  . Kidney disease Mother   . Hypertension Mother   . Heart disease Father 12       die of MI at age 63  . Suicidality Son   . Heart disease Brother   . Heart attack Brother   . Colon cancer Neg Hx   . Esophageal cancer Neg Hx   . Pancreatic cancer Neg Hx   . Rectal cancer Neg Hx   . Stomach cancer Neg Hx     Social History   Tobacco Use  . Smoking status: Never Smoker  . Smokeless tobacco: Never Used  Vaping Use  . Vaping Use: Never used  Substance Use Topics  . Alcohol use: Never  . Drug use: Never    Home  Medications Prior to Admission medications   Medication Sig Start Date End Date Taking? Authorizing Provider  acetaminophen (TYLENOL) 500 MG tablet Take 1,000 mg by mouth every 6 (six) hours as needed for moderate pain, headache or fever.   Yes [provider]  amiodarone (PACERONE) 200 MG tablet Take 1 tablet (200 mg total) by mouth daily. 07/24/20  Yes Wendall Stade, MD  amLODipine (NORVASC) 2.5 MG tablet TAKE 1 TABLET BY MOUTH EVERY DAY Patient taking differently: Take 2.5 mg by mouth daily. 04/25/20  Yes Nafziger, [name], NP  apixaban (ELIQUIS) 5 MG TABS tablet Take 1 tablet (5 mg total) by mouth 2 (two) times daily. 09/25/20 09/20/21 Yes Wendall Stade, MD  Carboxymethylcellulose Sodium (REFRESH TEARS OP) Place 1 drop into both eyes at bedtime.   Yes [provider]  carvedilol (COREG) 6.25 MG tablet Take 1 tablet (6.25 mg total) by mouth 2 (two) times daily with a meal. 07/24/20 07/19/21 Yes Wendall Stade, MD  clonazePAM (KLONOPIN) 0.5 MG tablet TAKE 1 TABLET BY MOUTH TWICE A DAY AS NEEDED FOR ANXIETY Patient taking differently: Take 0.25 mg by mouth 2 (two) times daily. 08/22/20  Yes Nafziger, [name], NP  DULoxetine (CYMBALTA) 60 MG capsule TAKE 1 CAPSULE BY MOUTH EVERY DAY Patient taking differently: Take 60 mg by mouth at bedtime. 09/25/20  Yes Lovorn, Aundra Millet, MD  famotidine (PEPCID) 20 MG tablet Take 20 mg by mouth daily. 07/28/20  Yes [provider]  furosemide (LASIX) 40 MG tablet TAKE 1 TABLET (40 MG TOTAL) BY MOUTH DAILY AS NEEDED FOR FLUID OR EDEMA. 07/25/20  Yes Wendall Stade, MD  gabapentin (NEURONTIN) 100 MG capsule TAKE 1 CAPSULE BY MOUTH THREE TIMES A DAY Patient taking differently: Take 100 mg by mouth 3 (three) times daily. 05/02/20  Yes Nafziger, [name], NP  HYDROcodone-acetaminophen (NORCO) 5-325 MG tablet Take 1 tablet by mouth 3 (three) times daily as needed for severe pain (for really bad days). 06/26/20  Yes Lovorn, Aundra Millet, MD  omeprazole (PRILOSEC)  20 MG capsule TAKE 1 CAPSULE BY MOUTH EVERY DAY Patient taking differently: Take 20 mg by mouth daily. 09/25/20  Yes Nafziger, [name], NP  sacubitril-valsartan (ENTRESTO) 97-103 MG Take 1 tablet by mouth 2 (two) times daily. 03/24/20  Yes Nishan,  Noralyn Pick, MD  spironolactone (ALDACTONE) 25 MG tablet Take 1 tablet (25 mg total) by mouth daily. 11/09/19 11/03/20 Yes Berton Bon, NP  traMADol (ULTRAM) 50 MG tablet Take 2 tablets (100 mg total) by mouth 2 (two) times daily. 04/10/20  Yes Lovorn, Aundra Millet, MD  albuterol (VENTOLIN HFA) 108 (90 Base) MCG/ACT inhaler TAKE 2 PUFFS BY MOUTH EVERY 6 HOURS AS NEEDED FOR WHEEZE OR SHORTNESS OF BREATH Patient taking differently: Inhale 2 puffs into the lungs every 6 (six) hours as needed for wheezing or shortness of breath. 05/29/20   Nyoka Cowden, MD    Allergies    Morphine and Tape  Review of Systems   Review of Systems  Unable to perform ROS: Acuity of condition  Cardiovascular: Positive for chest pain.     Physical Exam Updated Vital Signs BP (!) 120/53   Pulse 60   Temp 97.8 F (36.6 C) (Oral)   Resp 14   Ht  (1.549 m)   Wt 69 kg   SpO2 98%   BMI 28.74 kg/m   Physical Exam Vitals and nursing note reviewed.  Constitutional:      General: She is not in acute distress.    Appearance: She is well-developed and well-nourished. She is diaphoretic.     Comments: Patient calm. Skin cool, clammy.  HENT:     Head: Normocephalic and atraumatic.     Right Ear: External ear normal.     Left Ear: External ear normal.  Eyes:     General: No scleral icterus.    Extraocular Movements: EOM normal.     Conjunctiva/sclera: Conjunctivae normal.  Cardiovascular:     Rate and Rhythm: Regular rhythm. Tachycardia present.     Pulses: Normal pulses.     Comments: HR in the 140's, regular. Pulmonary:     Effort: Pulmonary effort is normal. No respiratory distress.     Comments: Respirations even and unlabored. Placed on O2 via [place] for  comfort. Musculoskeletal:        General: Normal range of motion.     Cervical back: Normal range of motion.     Comments: 1+ edema BLE  Skin:    General: Skin is warm.     Coloration: Skin is not pale.     Findings: No erythema or rash.  Neurological:     Mental Status: She is alert and oriented to person, place, and time.  Psychiatric:        Mood and Affect: Mood and affect normal.        Behavior: Behavior normal.     ED Results / Procedures / Treatments   Labs (all labs ordered are listed, but only abnormal results are displayed) Labs Reviewed  CBC WITH DIFFERENTIAL/PLATELET - Abnormal; Notable for the following components:      Result Value   WBC 12.6 (*)    RBC 3.86 (*)    Neutro Abs 8.2 (*)    Abs Immature Granulocytes 0.08 (*)    All other components within normal limits  BRAIN NATRIURETIC PEPTIDE - Abnormal; Notable for the following components:   B Natriuretic Peptide 100.4 (*)    All other components within normal limits  PROTIME-INR - Abnormal; Notable for the following components:   Prothrombin Time 15.6 (*)    INR 1.3 (*)    All other components within normal limits  I-STAT CHEM 8, ED - Abnormal; Notable for the following components:   BUN 28 (*)    All other  components within normal limits  TROPONIN I (HIGH SENSITIVITY) - Abnormal; Notable for the following components:   Troponin I (High Sensitivity) 24 (*)    All other components within normal limits  RESP PANEL BY RT-PCR (FLU A&B, COVID) ARPGX2  APTT    EKG EKG Interpretation  Date/Time:  Saturday October 14 2020 22:45:12 EST Ventricular Rate:  77 PR Interval:    QRS Duration: 89 QT Interval:  400 QTC Calculation: 453 R Axis:   -39 Text Interpretation: Sinus rhythm Left axis deviation Anteroseptal infarct, age indeterminate When comapred to ECG earlier tonight, now sinus rhythm. No STEMI Confirmed by Theda Belfast ([id number]) on 10/15/2020 1:27:18 AM   Radiology DG Chest Port 1 View  Result  Date: 10/14/2020 CLINICAL DATA:  Midsternal chest pain radiating into bilateral upper extremities. EXAM: PORTABLE CHEST 1 VIEW COMPARISON:  06/30/2020 FINDINGS: Overlying monitoring devices in place. Upper normal heart size with unchanged mediastinal contours. Aortic atherosclerosis. Interstitial coarsening with mild bronchial thickening in the lower lobes. No focal airspace disease. Biapical pleuroparenchymal scarring. No pneumothorax or large pleural effusion. No acute osseous abnormalities are seen. IMPRESSION: Mild interstitial and bronchial thickening, which may be in part chronic. Aortic Atherosclerosis (ICD10-I70.0). Electronically Signed   By: Narda Rutherford M.D.   On: 10/14/2020 23:35    RIGHT/LEFT HEART CATH AND CORONARY ANGIOGRAPHY 07/26/2019  Conclusion  Right dominant coronary anatomy  Widely patent left main  30 to 40% mid LAD  Widely patent circumflex  Luminal irregularities in a dominant right coronary.  Right heart pressures are relatively low.  Pulmonary wedge pressure mean is 2 mmHg.  LVEDP 4 mmHg.  Left ventriculography by hand-injection was not helpful.  From images obtained, LV function appears relatively normal.   ECHOCARDIOGRAM 01/2020  IMPRESSIONS  1. Left ventricular ejection fraction, by estimation, is 50 to 55%. The  left ventricle has low normal function. The left ventricle has no regional  wall motion abnormalities. Left ventricular diastolic parameters are  consistent with Grade I diastolic  dysfunction (impaired relaxation).  2. Right ventricular systolic function is normal. The right ventricular  size is normal. There is mildly elevated pulmonary artery systolic  pressure.  3. Left atrial size was mildly dilated.  4. The mitral valve is normal in structure. Mild mitral valve  regurgitation. No evidence of mitral stenosis.  5. The aortic valve is grossly normal. Aortic valve regurgitation is  mild. No aortic stenosis is present.     Procedures .Critical Care Performed by: Antony Madura, PA-C Authorized by: Antony Madura, PA-C   Critical care provider statement:    Critical care time (minutes):  45   Critical care was time spent personally by me on the following activities:  Discussions with consultants, evaluation of patient's response to treatment, examination of patient, ordering and performing treatments and interventions, ordering and review of laboratory studies, ordering and review of radiographic studies, pulse oximetry, re-evaluation of patient's condition, obtaining history from patient or surrogate and review of old charts   (including critical care time)  Medications Ordered in ED Medications  etomidate (AMIDATE) injection 6.98 mg (6.98 mg Intravenous Not Given 10/14/20 2246)  sodium chloride 0.9 % bolus 1,000 mL (0 mLs Intravenous Stopped 10/14/20 2230)  etomidate (AMIDATE) injection (6.98 mg Intravenous Given 10/14/20 2238)  sodium chloride 0.9 % bolus 1,000 mL (0 mLs Intravenous Stopped 10/15/20 0055)    ED Course  I have reviewed the triage vital signs and the nursing notes.  Pertinent labs & imaging results that were available  during my care of the patient were reviewed by me and considered in my medical decision making (see chart for details).  Clinical Course as of 10/15/20 0257  Sat Oct 14, 2020  2215 IVF infusing. BP 101/58 on recheck. Pads placed on patient. [KH]  2221 Question atrial flutter given history and regular rhythm on EKG. She had a reassuring heart cath in 2020 without severe CAD. No prior stents. Anticipate cardioversion given instability. Dr. Criss Alvine to bedside. [KH]  2247 Patient cardioverted with 150J to sinus rhythm. BP improved. Currently 116/63. Patient states chest pain is improved. [KH]  2322 Continues to feel well. HR controlled. BP 96/53. Will give additional 1L IVF. Labs pending. [KH]  Sun Oct 15, 2020  0200 Troponin 24 which is likely from demand. Will trend.  Cardiology paged to discuss plan for admission. [KH]  [id number] Case discussed with cardiology who will admit for observation given cardioversion of wide complex tachycardia. [KH]    Clinical Course User Index [KH] Darylene Price   MDM Rules/Calculators/A&P                          84 year old female presents to the emergency department for evaluation of chest pain which began at 1700.  Pain radiated to both shoulders and down bilateral arms.  Noted to have a heart rate in the 140s on arrival.  Blood pressures around 80 systolic.  EKG with wide complex tachycardia.  Patient required cardioversion given her hemodynamic instability.  Converted to sinus rhythm after shock of 150 J.  Heart rate has remained around 60 bpm.  Blood pressure has stabilized.  Patient states that she is feeling much better.  Plan for inpatient observation for monitoring.  Cardiology to see in the ED for admission.   Final Clinical Impression(s) / ED Diagnoses Final diagnoses:  Wide-complex tachycardia [facility])    Rx / DC Orders ED Discharge Orders    None       Antony Madura, PA-C 10/15/20 0259    Pricilla Loveless, MD 10/15/20 1239

## 2020-10-14 NOTE — ED Provider Notes (Signed)
.Sedation  Date/Time: 10/14/2020 11:20 PM Performed by: Pricilla Loveless, MD Authorized by: Pricilla Loveless, MD   Consent:    Consent obtained:  Emergent situation   Consent given by:  Patient   Risks discussed:  Allergic reaction, dysrhythmia, inadequate sedation, nausea, prolonged hypoxia resulting in organ damage, prolonged sedation necessitating reversal, respiratory compromise necessitating ventilatory assistance and intubation and vomiting   Alternatives discussed:  Analgesia without sedation, anxiolysis and regional anesthesia Universal protocol:    Procedure explained and questions answered to patient or proxy's satisfaction: yes     Relevant documents present and verified: yes     Test results available: yes     Imaging studies available: yes     Required blood products, implants, devices, and special equipment available: yes     Site/side marked: yes     Immediately prior to procedure, a time out was called: yes     Patient identity confirmed:  Verbally with patient Indications:    Procedure necessitating sedation performed by:  Physician performing sedation Pre-sedation assessment:    Time since last food or drink:  6 hours   NPO status caution: urgency dictates proceeding with non-ideal NPO status     ASA classification: class 3 - patient with severe systemic disease     Mouth opening:  3 or more finger widths   Thyromental distance:  4 finger widths   Mallampati score:  I - soft palate, uvula, fauces, pillars visible   Neck mobility: normal     Pre-sedation assessments completed and reviewed: airway patency, cardiovascular function, hydration status, mental status, nausea/vomiting, pain level, respiratory function and temperature   Immediate pre-procedure details:    Reassessment: Patient reassessed immediately prior to procedure     Reviewed: vital signs, relevant labs/tests and NPO status     Verified: bag valve mask available, emergency equipment available, intubation  equipment available, IV patency confirmed, oxygen available and suction available   Procedure details (see MAR for exact dosages):    Preoxygenation:  Nasal cannula   Sedation:  Etomidate   Intended level of sedation: deep   Intra-procedure monitoring:  Blood pressure monitoring, cardiac monitor, continuous pulse oximetry, frequent LOC assessments, frequent vital sign checks and continuous capnometry   Intra-procedure events: none     Total Provider sedation time (minutes):  12 Post-procedure details:    Attendance: Constant attendance by certified staff until patient recovered     Recovery: Patient returned to pre-procedure baseline     Post-sedation assessments completed and reviewed: airway patency, cardiovascular function, hydration status, mental status, nausea/vomiting, pain level, respiratory function and temperature     Patient is stable for discharge or admission: yes     Procedure completion:  Tolerated well, no immediate complications .Cardioversion  Date/Time: 10/14/2020 11:20 PM Performed by: Pricilla Loveless, MD Authorized by: Pricilla Loveless, MD   Consent:    Consent obtained:  Emergent situation   Consent given by:  Patient Pre-procedure details:    Cardioversion basis:  Emergent   Rhythm:  Atrial flutter   Electrode placement:  Anterior-posterior Patient sedated: Yes. Refer to sedation procedure documentation for details of sedation.  Attempt one:    Cardioversion mode:  Synchronous   Waveform:  Biphasic   Shock (Joules):  150   Shock outcome:  Conversion to normal sinus rhythm Post-procedure details:    Patient status:  Awake   Patient tolerance of procedure:  Tolerated well, no immediate complications .Critical Care Performed by: Pricilla Loveless, MD Authorized by: Pricilla Loveless, MD  Critical care provider statement:    Critical care time (minutes):  35   Critical care time was exclusive of:  Separately billable procedures and treating other patients    Critical care was necessary to treat or prevent imminent or life-threatening deterioration of the following conditions:  Circulatory failure and cardiac failure   Critical care was time spent personally by me on the following activities:  Discussions with consultants, evaluation of patient's response to treatment, examination of patient, ordering and performing treatments and interventions, ordering and review of laboratory studies, ordering and review of radiographic studies, pulse oximetry, re-evaluation of patient's condition, obtaining history from patient or surrogate and review of old charts      Pricilla Loveless, MD 10/15/20 1238

## 2020-10-14 NOTE — Sedation Documentation (Signed)
Pt shocked at 150 joules by MD Criss Alvine

## 2020-10-14 NOTE — ED Triage Notes (Signed)
Coming from home with EMS with c/o mid sternal CP that radiates out to BUE. Pt has hx of afib; pt was cool, clammy, and diaphoretic when EMS got to pt. Pt initial bp 70/40 with hr in 140-148 range. Pt has been cardioverted in past. Pt received 324 ASA PTA. GCS 15.

## 2020-10-15 ENCOUNTER — Encounter (HOSPITAL_COMMUNITY): Payer: Self-pay | Admitting: Internal Medicine

## 2020-10-15 DIAGNOSIS — I5022 Chronic systolic (congestive) heart failure: Secondary | ICD-10-CM | POA: Diagnosis not present

## 2020-10-15 DIAGNOSIS — I10 Essential (primary) hypertension: Secondary | ICD-10-CM | POA: Diagnosis not present

## 2020-10-15 DIAGNOSIS — I4891 Unspecified atrial fibrillation: Secondary | ICD-10-CM | POA: Diagnosis not present

## 2020-10-15 LAB — BASIC METABOLIC PANEL
Anion gap: 8 (ref 5–15)
BUN: 21 mg/dL (ref 8–23)
CO2: 24 mmol/L (ref 22–32)
Calcium: 8.2 mg/dL — ABNORMAL LOW (ref 8.9–10.3)
Chloride: 109 mmol/L (ref 98–111)
Creatinine, Ser: 0.89 mg/dL (ref 0.44–1.00)
GFR, Estimated: >60 mL/min (ref >60)
Glucose, Bld: 87 mg/dL (ref 70–99)
Potassium: 4.9 mmol/L (ref 3.5–5.1)
Sodium: 141 mmol/L (ref 135–145)

## 2020-10-15 LAB — CBC WITH DIFFERENTIAL/PLATELET
Abs Immature Granulocytes: 0.06 10*3/uL (ref 0.00–0.07)
Basophils Absolute: 0.1 10*3/uL (ref 0.0–0.1)
Basophils Relative: 1 %
Eosinophils Absolute: 0.2 10*3/uL (ref 0.0–0.5)
Eosinophils Relative: 3 %
HCT: 37.3 % (ref 36.0–46.0)
Hemoglobin: 11.6 g/dL — ABNORMAL LOW (ref 12.0–15.0)
Immature Granulocytes: 1 %
Lymphocytes Relative: 20 %
Lymphs Abs: 1.8 10*3/uL (ref 0.7–4.0)
MCH: 31.6 pg (ref 26.0–34.0)
MCHC: 31.1 g/dL (ref 30.0–36.0)
MCV: 101.6 fL — ABNORMAL HIGH (ref 80.0–100.0)
Monocytes Absolute: 0.7 10*3/uL (ref 0.1–1.0)
Monocytes Relative: 8 %
Neutro Abs: 5.9 10*3/uL (ref 1.7–7.7)
Neutrophils Relative %: 67 %
Platelets: 237 10*3/uL (ref 150–400)
RBC: 3.67 MIL/uL — ABNORMAL LOW (ref 3.87–5.11)
RDW: 13.4 % (ref 11.5–15.5)
WBC: 8.7 10*3/uL (ref 4.0–10.5)
nRBC: 0 % (ref 0.0–0.2)

## 2020-10-15 LAB — APTT: aPTT: 27 seconds (ref 24–36)

## 2020-10-15 LAB — TROPONIN I (HIGH SENSITIVITY): Troponin I (High Sensitivity): 24 ng/L — ABNORMAL HIGH (ref <18)

## 2020-10-15 LAB — PROTIME-INR
INR: 1.3 — ABNORMAL HIGH (ref 0.8–1.2)
Prothrombin Time: 15.6 seconds — ABNORMAL HIGH (ref 11.4–15.2)

## 2020-10-15 LAB — TSH: TSH: 2.023 u[IU]/mL (ref 0.350–4.500)

## 2020-10-15 MED ORDER — SACUBITRIL-VALSARTAN 97-103 MG PO TABS
1.0000 | ORAL_TABLET | Freq: Two times a day (BID) | ORAL | Status: DC
  Administered 2020-10-15: 1 via ORAL
  Filled 2020-10-15 (×2): qty 1

## 2020-10-15 MED ORDER — ACETAMINOPHEN 325 MG PO TABS: 650.0000 mg | ORAL_TABLET | ORAL | Status: DC | PRN

## 2020-10-15 MED ORDER — SPIRONOLACTONE 25 MG PO TABS
25.0000 mg | ORAL_TABLET | Freq: Every day | ORAL | Status: DC
  Administered 2020-10-15: 25 mg via ORAL
  Filled 2020-10-15: qty 1

## 2020-10-15 MED ORDER — CLONAZEPAM 0.5 MG PO TABS: 0.2500 mg | ORAL_TABLET | Freq: Two times a day (BID) | ORAL | Status: DC

## 2020-10-15 MED ORDER — FAMOTIDINE 20 MG PO TABS
20.0000 mg | ORAL_TABLET | Freq: Every day | ORAL | Status: DC
  Administered 2020-10-15: 20 mg via ORAL
  Filled 2020-10-15: qty 1

## 2020-10-15 MED ORDER — GABAPENTIN 100 MG PO CAPS
100.0000 mg | ORAL_CAPSULE | Freq: Three times a day (TID) | ORAL | Status: DC
  Administered 2020-10-15: 100 mg via ORAL
  Filled 2020-10-15: qty 1

## 2020-10-15 MED ORDER — APIXABAN 5 MG PO TABS
5.0000 mg | ORAL_TABLET | Freq: Two times a day (BID) | ORAL | Status: DC
  Administered 2020-10-15: 5 mg via ORAL
  Filled 2020-10-15: qty 1

## 2020-10-15 MED ORDER — PANTOPRAZOLE SODIUM 40 MG PO TBEC
40.0000 mg | DELAYED_RELEASE_TABLET | Freq: Every day | ORAL | Status: DC
  Administered 2020-10-15: 40 mg via ORAL
  Filled 2020-10-15: qty 1

## 2020-10-15 MED ORDER — CLONAZEPAM 0.25 MG PO TBDP
0.2500 mg | ORAL_TABLET | Freq: Two times a day (BID) | ORAL | Status: DC
  Administered 2020-10-15: 0.25 mg via ORAL
  Filled 2020-10-15: qty 1

## 2020-10-15 MED ORDER — AMLODIPINE BESYLATE 2.5 MG PO TABS
2.5000 mg | ORAL_TABLET | Freq: Every day | ORAL | Status: DC
  Administered 2020-10-15: 2.5 mg via ORAL
  Filled 2020-10-15: qty 1

## 2020-10-15 MED ORDER — ALBUTEROL SULFATE HFA 108 (90 BASE) MCG/ACT IN AERS
2.0000 | INHALATION_SPRAY | Freq: Four times a day (QID) | RESPIRATORY_TRACT | Status: DC | PRN
  Filled 2020-10-15: qty 6.7

## 2020-10-15 MED ORDER — FUROSEMIDE 40 MG PO TABS: 40.0000 mg | ORAL_TABLET | Freq: Every day | ORAL | Status: DC | PRN

## 2020-10-15 MED ORDER — POLYVINYL ALCOHOL 1.4 % OP SOLN
1.0000 [drp] | Freq: Every day | OPHTHALMIC | Status: DC
  Filled 2020-10-15: qty 15

## 2020-10-15 MED ORDER — CARVEDILOL 6.25 MG PO TABS
6.2500 mg | ORAL_TABLET | Freq: Two times a day (BID) | ORAL | Status: DC
  Administered 2020-10-15: 6.25 mg via ORAL
  Filled 2020-10-15: qty 1

## 2020-10-15 MED ORDER — DULOXETINE HCL 60 MG PO CPEP
60.0000 mg | ORAL_CAPSULE | Freq: Every day | ORAL | Status: DC
Start: 2020-10-15 — End: 2020-10-15

## 2020-10-15 MED ORDER — AMIODARONE HCL 200 MG PO TABS
200.0000 mg | ORAL_TABLET | Freq: Every day | ORAL | Status: DC
  Administered 2020-10-15: 200 mg via ORAL
  Filled 2020-10-15: qty 1

## 2020-10-15 MED ORDER — INFLUENZA VAC A&B SA ADJ QUAD 0.5 ML IM PRSY
0.5000 mL | PREFILLED_SYRINGE | INTRAMUSCULAR | Status: AC | PRN
  Administered 2020-10-15: 0.5 mL via INTRAMUSCULAR
  Filled 2020-10-15: qty 0.5

## 2020-10-15 NOTE — Plan of Care (Signed)

## 2020-10-15 NOTE — Discharge Summary (Addendum)
Discharge Summary    Patient ID: Julie Rice MRN: [id number]; DOB: 21-Oct-1936  Admit date: 10/14/2020 Discharge date: 10/15/2020  Primary Care Provider: Shirline Frees, NP  Primary Cardiologist: Charlton Haws, MD  Primary Electrophysiologist:  None   Discharge Diagnoses    Principal Problem:   Atrial fibrillation with RVR [facility]) Active Problems:   Hypothyroidism   Dyslipidemia   Essential hypertension   COPD (chronic obstructive pulmonary disease) (HCC)   Atrial fibrillation (HCC)    Diagnostic Studies/Procedures    Cardioversion in the ED  _____________   History of Present Illness     Julie Rice is a 84 y.o. female with CAD, HTN, COPD, afib, COPD who presents to the ED with chest tightness and SOB found to be in likely afib with RVR, now s/p cardioversion.   Julie Rice is an 84 year old female with a history of coronary disease, hypertension, COPD, atrial fibrillation chronically anticoagulated on apixaban, COPD who presented in atrial fibrillation with RVR.  Notes that this started around 5 PM yesterday with an acute onset and she started having palpitations, chest tightness, and shortness of breath.  She typically is able to tell when she is in atrial fibrillation and this feels similarly.  She has a prior of cardiomyopathy with a LBBB however has since had recovery of her EF.  On arrival to the emergency department, she was in a wide-complex tachycardia that appeared irregular with heart rates in the 140s.  She was hypotensive and thus the emergency department did urgent cardioversion.  Of note, she had missed a few days of her anticoagulation in the last week, however she was hemodynamically unstable and the ED appropriately proceeded with urgent cardioversion.  On my examination, she reports no symptoms.  She has had improvement in her chest pain and shortness of breath.  She does not report any unusual symptoms leading up to the event including fevers,  chills, night sweats, abdominal pain, medication changes, or any other clear triggering events.  Having any chest pain prior.  Hospital Course     Consultants: N/A  Patient underwent emergent cardioversion in the ED due to tachycardia and hypotension.  Postprocedure, he was admitted by cardiology service to follow-up on her blood pressure.  Over time, her blood pressure slowly improved back to normal.  She was seen in the morning on 10/15/2020 at which time she was doing well without any recurrent atrial fibrillation.  She denies any further chest burning sensation or shortness of breath which symptoms she usually associated with atrial fibrillation.  She is cleared for discharge from cardiology perspective on previous home medication.  She is currently scheduled to see Dr. Eden Emms in March, I will arrange a 1 month follow-up with APP.  Did the patient have an acute coronary syndrome (MI, NSTEMI, STEMI, etc) this admission?:  No                               Did the patient have a percutaneous coronary intervention (stent / angioplasty)?:  No.       _____________  Discharge Vitals Blood pressure 125/67, pulse 69, temperature 98.1 F (36.7 C), temperature source Oral, resp. rate 12, height 5\' 1"  (1.549 m), weight 71.2 kg, SpO2 99 %.  Filed Weights   10/14/20 2253 10/15/20 0605  Weight: 69 kg 71.2 kg    Labs & Radiologic Studies    CBC Recent Labs    10/14/20 2227 10/14/20 2234  10/15/20 0503  WBC 12.6*  --  8.7  NEUTROABS 8.2*  --  5.9  HGB 12.2 13.6 11.6*  HCT 38.4 40.0 37.3  MCV 99.5  --  101.6*  PLT 335  --  237   Basic Metabolic Panel Recent Labs    85/63/14 2234 10/15/20 0503  NA 139 141  K 4.8 4.9  CL 107 109  CO2  --  24  GLUCOSE 85 87  BUN 28* 21  CREATININE 1.00 0.89  CALCIUM  --  8.2*   Liver Function Tests No results for input(s): AST, ALT, ALKPHOS, BILITOT, PROT, ALBUMIN in the last 72 hours. No results for input(s): LIPASE, AMYLASE in the last 72  hours. High Sensitivity Troponin:   Recent Labs  Lab 10/15/20 0016  TROPONINIHS 24*    BNP Invalid input(s): POCBNP D-Dimer No results for input(s): DDIMER in the last 72 hours. Hemoglobin A1C No results for input(s): HGBA1C in the last 72 hours. Fasting Lipid Panel No results for input(s): CHOL, HDL, LDLCALC, TRIG, CHOLHDL, LDLDIRECT in the last 72 hours. Thyroid Function Tests Recent Labs    10/15/20 0503  TSH 2.023   _____________  MR Brain Wo Contrast  Result Date: 09/28/2020 CLINICAL DATA:  Head trauma, mod-severe loss of smell and fall hitting the left side of her head. Chronic left sided head pain EXAM: MRI HEAD WITHOUT CONTRAST TECHNIQUE: Multiplanar, multiecho pulse sequences of the brain and surrounding structures were obtained without intravenous contrast. COMPARISON:  03/19/2005 MRI head.  03/05/2005 head CT. FINDINGS: Brain: No diffusion-weighted signal abnormality. No intracranial hemorrhage. No midline shift, ventriculomegaly or extra-axial fluid collection. No mass lesion. Mild cerebral atrophy with ex vacuo dilatation. Moderate chronic microvascular ischemic changes. Chronic left cerebellar lacunar insult. Vascular: Normal flow voids. Skull and upper cervical spine: Normal marrow signal. Sinuses/Orbits: Sequela of bilateral lens replacement. Minimal ethmoid sinus mucosal thickening. Pneumatized mastoid air cells. Other: None. IMPRESSION: 1. No acute intracranial process. Chronic left cerebellar lacunar insult. 2. Mild cerebral atrophy. Moderate chronic microvascular ischemic changes. Electronically Signed   By: Stana Bunting M.D.   On: 09/28/2020 06:46   DG Chest Port 1 View  Result Date: 10/14/2020 CLINICAL DATA:  Midsternal chest pain radiating into bilateral upper extremities. EXAM: PORTABLE CHEST 1 VIEW COMPARISON:  06/30/2020 FINDINGS: Overlying monitoring devices in place. Upper normal heart size with unchanged mediastinal contours. Aortic atherosclerosis.  Interstitial coarsening with mild bronchial thickening in the lower lobes. No focal airspace disease. Biapical pleuroparenchymal scarring. No pneumothorax or large pleural effusion. No acute osseous abnormalities are seen. IMPRESSION: Mild interstitial and bronchial thickening, which may be in part chronic. Aortic Atherosclerosis (ICD10-I70.0). Electronically Signed   By: Narda Rutherford M.D.   On: 10/14/2020 23:35   Disposition   Pt is being discharged home today in good condition.  Follow-up Plans & Appointments     Follow-up Information    Wendall Stade, MD Follow up.   Specialty: Cardiology Why: office scheduler will contact you to arrange a earlier follow up, please give [place] a call if you do not hear from our schedulers in 3 business days Contact information: [address] [place] [address] [phone]              Discharge Instructions    Amb referral to AFIB Clinic   Complete by: As directed    Diet - low sodium heart healthy   Complete by: As directed       Discharge Medications  Allergies as of 10/15/2020      Reactions   Morphine Other (See Comments)   Flushing, rash, itching   Tape Other (See Comments)   Band aides, adhesive tape Redness and pulls skin off      Medication List    TAKE these medications   acetaminophen 500 MG tablet Commonly known as: TYLENOL Take 1,000 mg by mouth every 6 (six) hours as needed for moderate pain, headache or fever.   albuterol 108 (90 Base) MCG/ACT inhaler Commonly known as: VENTOLIN HFA TAKE 2 PUFFS BY MOUTH EVERY 6 HOURS AS NEEDED FOR WHEEZE OR SHORTNESS OF BREATH What changed: See the new instructions.   amiodarone 200 MG tablet Commonly known as: PACERONE Take 1 tablet (200 mg total) by mouth daily.   amLODipine 2.5 MG tablet Commonly known as: NORVASC TAKE 1 TABLET BY MOUTH EVERY DAY   apixaban 5 MG Tabs tablet Commonly known as: ELIQUIS Take 1 tablet (5 mg total) by mouth 2  (two) times daily.   carvedilol 6.25 MG tablet Commonly known as: COREG Take 1 tablet (6.25 mg total) by mouth 2 (two) times daily with a meal.   clonazePAM 0.5 MG tablet Commonly known as: KLONOPIN Take 0.5 tablets (0.25 mg total) by mouth 2 (two) times daily. What changed: See the new instructions.   DULoxetine 60 MG capsule Commonly known as: CYMBALTA TAKE 1 CAPSULE BY MOUTH EVERY DAY What changed:   how much to take  when to take this   Entresto 97-103 MG Generic drug: sacubitril-valsartan Take 1 tablet by mouth 2 (two) times daily.   famotidine 20 MG tablet Commonly known as: PEPCID Take 20 mg by mouth daily.   furosemide 40 MG tablet Commonly known as: LASIX TAKE 1 TABLET (40 MG TOTAL) BY MOUTH DAILY AS NEEDED FOR FLUID OR EDEMA.   gabapentin 100 MG capsule Commonly known as: NEURONTIN TAKE 1 CAPSULE BY MOUTH THREE TIMES A DAY What changed: See the new instructions.   HYDROcodone-acetaminophen 5-325 MG tablet Commonly known as: Norco Take 1 tablet by mouth 3 (three) times daily as needed for severe pain (for really bad days).   omeprazole 20 MG capsule Commonly known as: PRILOSEC TAKE 1 CAPSULE BY MOUTH EVERY DAY What changed: how much to take   REFRESH TEARS OP Place 1 drop into both eyes at bedtime.   spironolactone 25 MG tablet Commonly known as: ALDACTONE Take 1 tablet (25 mg total) by mouth daily.   traMADol 50 MG tablet Commonly known as: Ultram Take 2 tablets (100 mg total) by mouth 2 (two) times daily.          Outstanding Labs/Studies   N/A  Duration of Discharge Encounter   Greater than 30 minutes including physician time.  Ramond Dial, PA 10/15/2020, 1:21 PM

## 2020-10-15 NOTE — H&P (Signed)
Cardiology Admission History and Physical:   Patient ID: Julie Rice MRN: [id number]; DOB: 07/28/1936   Admission date: 10/14/2020  Primary Care Provider: Shirline Frees, NP Primary Cardiologist: Charlton Haws, MD  Primary Electrophysiologist:  None   Chief Complaint:  Chest tightness, palpitations  Patient Profile:   Julie Rice is a 84 y.o. female with CAD, HTN, COPD, afib, COPD who presents to the ED with chest tightness and SOB found to be in likely afib with RVR, now s/p cardioversion.   History of Present Illness:   Julie Rice is an 84 year old female with a history of coronary disease, hypertension, COPD, atrial fibrillation chronically anticoagulated on apixaban, COPD who presented in atrial fibrillation with RVR.  Notes that this started around 5 PM yesterday with an acute onset and she started having palpitations, chest tightness, and shortness of breath.  She typically is able to tell when she is in atrial fibrillation and this feels similarly.  She has a prior of cardiomyopathy with a LBBB however has since had recovery of her EF.  On arrival to the emergency department, she was in a wide-complex tachycardia that appeared irregular with heart rates in the 140s.  She was hypotensive and thus the emergency department did urgent cardioversion.  Of note, she had missed a few days of her anticoagulation in the last week, however she was hemodynamically unstable and the ED appropriately proceeded with urgent cardioversion.  On my examination, she reports no symptoms.  She has had improvement in her chest pain and shortness of breath.  She does not report any unusual symptoms leading up to the event including fevers, chills, night sweats, abdominal pain, medication changes, or any other clear triggering events.  Having any chest pain prior.   Past Medical History:  Diagnosis Date  . A-fib (HCC)   . Anemia   . Anxiety   . Bipolar disorder (HCC)   . Blood transfusion 1991    autologous pts own blood given   . CAD (coronary artery disease)   . Cataract   . Chest pain    "@ rest, lying down, w/exertion"  . CHF (congestive heart failure) (HCC) 07/2020  . Chronic back pain    "mostly lower back but I do have upper back pain regularly" (04/06/2018)  . COPD (chronic obstructive pulmonary disease) (HCC)   . Depression   . Esophageal stricture   . Fibromyalgia   . GERD (gastroesophageal reflux disease)   . Heart murmur    "slight" (04/06/2018)  . Hiatal hernia   . Hyperlipidemia    Patient denies  . Hypertension   . Internal hemorrhoids   . Lumbago   . Mitral regurgitation   . Osteoarthritis   . Osteoporosis   . Pneumonia ~ 01/2018  . PONV (postoperative nausea and vomiting)    severe ponv, "in the past" (04/06/2018)  . Rectal bleeding   . Rheumatoid arthritis (HCC)   . Spondylosis   . TIA (transient ischemic attack) 2013  . Urinary incontinence    wears depends    Past Surgical History:  Procedure Laterality Date  . ABDOMINAL HYSTERECTOMY  1982  . BACK SURGERY    . BALLOON DILATION N/A 09/21/2018   Procedure: BALLOON DILATION;  Surgeon: Hilarie Fredrickson, MD;  Location: [facility] ENDOSCOPY;  Service: Endoscopy;  Laterality: N/A;  . BALLOON DILATION N/A 08/12/2019   Procedure: BALLOON DILATION;  Surgeon: Lynann Bologna, MD;  Location: [facility] ENDOSCOPY;  Service: Endoscopy;  Laterality: N/A;  . BIOPSY  08/12/2019  Procedure: BIOPSY;  Surgeon: Lynann Bologna, MD;  Location: [facility] ENDOSCOPY;  Service: Endoscopy;;  . BLADDER SUSPENSION  1980's  . BOTOX INJECTION N/A 09/21/2018   Procedure: BOTOX INJECTION;  Surgeon: Hilarie Fredrickson, MD;  Location: [facility] ENDOSCOPY;  Service: Endoscopy;  Laterality: N/A;  . BOTOX INJECTION N/A 08/12/2019   Procedure: BOTOX INJECTION;  Surgeon: Lynann Bologna, MD;  Location: [facility] ENDOSCOPY;  Service: Endoscopy;  Laterality: N/A;  . CATARACT EXTRACTION W/ INTRAOCULAR LENS  IMPLANT, BILATERAL Bilateral 2010  . DILATION AND CURETTAGE OF UTERUS  1961  .  ESOPHAGEAL MANOMETRY N/A 03/25/2018   Procedure: ESOPHAGEAL MANOMETRY (EM);  Surgeon: Napoleon Form, MD;  Location: [facility] ENDOSCOPY;  Service: Endoscopy;  Laterality: N/A;  . ESOPHAGOGASTRODUODENOSCOPY (EGD) WITH PROPOFOL N/A 04/07/2018   Procedure: ESOPHAGOGASTRODUODENOSCOPY (EGD) WITH PROPOFOL;  Surgeon: Sherrilyn Rist, MD;  Location: [facility] ENDOSCOPY;  Service: Gastroenterology;  Laterality: N/A;  . ESOPHAGOGASTRODUODENOSCOPY (EGD) WITH PROPOFOL N/A 09/21/2018   Procedure: ESOPHAGOGASTRODUODENOSCOPY (EGD) WITH PROPOFOL;  Surgeon: Hilarie Fredrickson, MD;  Location: [facility] ENDOSCOPY;  Service: Endoscopy;  Laterality: N/A;  . ESOPHAGOGASTRODUODENOSCOPY (EGD) WITH PROPOFOL N/A 08/12/2019   Procedure: ESOPHAGOGASTRODUODENOSCOPY (EGD) WITH PROPOFOL;  Surgeon: Lynann Bologna, MD;  Location: [facility] ENDOSCOPY;  Service: Endoscopy;  Laterality: N/A;  . HERNIA REPAIR    . HIATAL HERNIA REPAIR N/A 08/02/2016   Procedure: LAPAROSCOPIC REPAIR OF LARGE  HIATAL HERNIA;  Surgeon: Glenna Fellows, MD;  Location: [facility] ORS;  Service: General;  Laterality: N/A;  . JOINT REPLACEMENT    . LAPAROSCOPIC NISSEN FUNDOPLICATION N/A 08/02/2016   Procedure: LAPAROSCOPIC NISSEN FUNDOPLICATION;  Surgeon: Glenna Fellows, MD;  Location: [facility] ORS;  Service: General;  Laterality: N/A;  . LUMBAR LAMINECTOMY  1990; 1994; 0175;1025   "I've got 2 stainless steel rods; 6 screws; 2 ray cages"took bone from right hip to put in back  . RIGHT/LEFT HEART CATH AND CORONARY ANGIOGRAPHY N/A 07/26/2019   Procedure: RIGHT/LEFT HEART CATH AND CORONARY ANGIOGRAPHY;  Surgeon: Lyn Records, MD;  Location: [facility] INVASIVE CV LAB;  Service: Cardiovascular;  Laterality: N/A;  . TEE WITHOUT CARDIOVERSION N/A 08/19/2019   Procedure: TRANSESOPHAGEAL ECHOCARDIOGRAM (TEE);  Surgeon: Lewayne Bunting, MD;  Location: [facility] ENDOSCOPY;  Service: Cardiovascular;  Laterality: N/A;  . TONSILLECTOMY AND ADENOIDECTOMY  1945  . TOTAL KNEE ARTHROPLASTY Right ~ 1996  . TUBAL LIGATION   ~ 1976     Medications Prior to Admission: Prior to Admission medications   Medication Sig Start Date End Date Taking? Authorizing Provider  acetaminophen (TYLENOL) 500 MG tablet Take 1,000 mg by mouth every 6 (six) hours as needed for moderate pain, headache or fever.   Yes [provider]  amiodarone (PACERONE) 200 MG tablet Take 1 tablet (200 mg total) by mouth daily. 07/24/20  Yes Wendall Stade, MD  amLODipine (NORVASC) 2.5 MG tablet TAKE 1 TABLET BY MOUTH EVERY DAY Patient taking differently: Take 2.5 mg by mouth daily. 04/25/20  Yes Nafziger, [name], NP  apixaban (ELIQUIS) 5 MG TABS tablet Take 1 tablet (5 mg total) by mouth 2 (two) times daily. 09/25/20 09/20/21 Yes Wendall Stade, MD  Carboxymethylcellulose Sodium (REFRESH TEARS OP) Place 1 drop into both eyes at bedtime.   Yes [provider]  carvedilol (COREG) 6.25 MG tablet Take 1 tablet (6.25 mg total) by mouth 2 (two) times daily with a meal. 07/24/20 07/19/21 Yes Wendall Stade, MD  clonazePAM (KLONOPIN) 0.5 MG tablet TAKE 1 TABLET BY MOUTH TWICE A DAY AS NEEDED FOR ANXIETY  Patient taking differently: Take 0.25 mg by mouth 2 (two) times daily. 08/22/20  Yes Nafziger, [name], NP  DULoxetine (CYMBALTA) 60 MG capsule TAKE 1 CAPSULE BY MOUTH EVERY DAY Patient taking differently: Take 60 mg by mouth at bedtime. 09/25/20  Yes Lovorn, Aundra Millet, MD  famotidine (PEPCID) 20 MG tablet Take 20 mg by mouth daily. 07/28/20  Yes [provider]  furosemide (LASIX) 40 MG tablet TAKE 1 TABLET (40 MG TOTAL) BY MOUTH DAILY AS NEEDED FOR FLUID OR EDEMA. 07/25/20  Yes Wendall Stade, MD  gabapentin (NEURONTIN) 100 MG capsule TAKE 1 CAPSULE BY MOUTH THREE TIMES A DAY Patient taking differently: Take 100 mg by mouth 3 (three) times daily. 05/02/20  Yes Nafziger, [name], NP  HYDROcodone-acetaminophen (NORCO) 5-325 MG tablet Take 1 tablet by mouth 3 (three) times daily as needed for severe pain (for really bad days). 06/26/20  Yes Lovorn,  Aundra Millet, MD  omeprazole (PRILOSEC) 20 MG capsule TAKE 1 CAPSULE BY MOUTH EVERY DAY Patient taking differently: Take 20 mg by mouth daily. 09/25/20  Yes Nafziger, [name], NP  sacubitril-valsartan (ENTRESTO) 97-103 MG Take 1 tablet by mouth 2 (two) times daily. 03/24/20  Yes Wendall Stade, MD  spironolactone (ALDACTONE) 25 MG tablet Take 1 tablet (25 mg total) by mouth daily. 11/09/19 11/03/20 Yes Berton Bon, NP  traMADol (ULTRAM) 50 MG tablet Take 2 tablets (100 mg total) by mouth 2 (two) times daily. 04/10/20  Yes Lovorn, Aundra Millet, MD  albuterol (VENTOLIN HFA) 108 (90 Base) MCG/ACT inhaler TAKE 2 PUFFS BY MOUTH EVERY 6 HOURS AS NEEDED FOR WHEEZE OR SHORTNESS OF BREATH Patient taking differently: Inhale 2 puffs into the lungs every 6 (six) hours as needed for wheezing or shortness of breath. 05/29/20   Nyoka Cowden, MD     Allergies:    Allergies  Allergen Reactions  . Morphine Other (See Comments)    Flushing, rash, itching  . Tape Other (See Comments)    Band aides, adhesive tape Redness and pulls skin off    Social History:   Social History   Socioeconomic History  . Marital status: Widowed    Spouse name: Not on file  . Number of children: 2  . Years of education: Not on file  . Highest education level: Not on file  Occupational History    Employer: RETIRED  Tobacco Use  . Smoking status: Never Smoker  . Smokeless tobacco: Never Used  Vaping Use  . Vaping Use: Never used  Substance and Sexual Activity  . Alcohol use: Never  . Drug use: Never  . Sexual activity: Not Currently    Comment: 1st intercourse 49 yo-1 partner  Other Topics Concern  . Not on file  Social History Narrative   Retired    Widowed   Social Determinants of Corporate investment banker Strain: Not on [employer] Insecurity: Not on file  Transportation Needs: Not on file  Physical Activity: Not on file  Stress: Not on file  Social Connections: Not on file  Intimate Partner Violence: Not on file     Family History:   The patient's family history includes Heart attack in her brother; Heart disease in her brother; Heart disease (age of onset: 79) in her father; Hypertension in her mother; Kidney disease in her mother; Suicidality in her son. There is no history of Colon cancer, Esophageal cancer, Pancreatic cancer, Rectal cancer, or Stomach cancer.    Review of Systems: [y] = yes, [ ]  = no  General: Weight gain ; Weight loss ; Anorexia ; Fatigue ; Fever ; Chills ; Weakness    Cardiac: Chest pain/pressure [ x]; Resting SOB [x ]; Exertional SOB ; Orthopnea ; Pedal Edema ; Palpitations x ]; Syncope ; Presyncope ; Paroxysmal nocturnal dyspnea[ ]    Pulmonary: Cough ; Wheezing[ ] ; Hemoptysis[ ] ; Sputum ; Snoring    GI: Vomiting[ ] ; Dysphagia[ ] ; Melena[ ] ; Hematochezia ; Heartburn[ ] ; Abdominal pain ; Constipation ; Diarrhea ; BRBPR    GU: Hematuria[ ] ; Dysuria ; Nocturia[ ]    Vascular: Pain in legs with walking ; Pain in feet with lying flat ; Non-healing sores ; Stroke ; TIA ; Slurred speech ;   Neuro: Headaches[ ] ; Vertigo[ ] ; Seizures[ ] ; Paresthesias[ ] ;Blurred vision ; Diplopia ; Vision changes    Ortho/Skin: Arthritis ; Joint pain ; Muscle pain ; Joint swelling ; Back Pain ; Rash    Psych: Depression[ ] ; Anxiety[ ]    Heme: Bleeding problems ; Clotting disorders ; Anemia    Endocrine: Diabetes ; Thyroid dysfunction[ ]   Physical Exam/Data:   Vitals:   10/15/20 0131 10/15/20 0145 10/15/20 0215 10/15/20 0230  BP: (!) 126/49 (!) 117/56 (!) 120/54 (!) 120/53  Pulse: (!) 58 (!) 59 (!) 58 60  Resp: Temp:      TempSrc:      SpO2: 98% 99% 98% 98%  Weight:      Height:        Intake/Output Summary (Last 24 hours) at 10/15/2020 0418 Last data filed at 10/15/2020 0055 Gross per 24 hour  Intake 2000 ml  Output --  Net 2000 ml   Filed Weights    10/14/20 2253  Weight: 69 kg   Body mass index is 28.74 kg/m.  General: Elderly female in no apparent distress and comfortably in bed HEENT: normal Lymph: no adenopathy Neck: no JVD Endocrine:  No thryomegaly Vascular: No carotid bruits; FA pulses 2+ bilaterally without bruits  Cardiac:  normal S1, S2; RRR; no murmur  Lungs:  clear to auscultation bilaterally, no wheezing, rhonchi or rales  Abd: soft, nontender, no hepatomegaly  Ext: no edema Musculoskeletal:  No deformities, BUE and BLE strength normal and equal Skin: warm and dry  Neuro:  CNs 2-12 intact, no focal abnormalities noted Psych:  Normal affect    EKG:  The ECG that was done  was personally reviewed and demonstrates normal sinus rhythm post cardioversion.  Interestingly no resting LBBB at this time.  Relevant CV Studies: None  Laboratory Data:  Chemistry Recent Labs  Lab 10/14/20 2234  NA 139  K 4.8  CL 107  GLUCOSE 85  BUN 28*  CREATININE 1.00    No results for input(s): PROT, ALBUMIN, AST, ALT, ALKPHOS, BILITOT in the last 168 hours. Hematology Recent Labs  Lab 10/14/20 2227 10/14/20 2234  WBC 12.6*  --   RBC 3.86*  --   HGB 12.2 13.6  HCT 38.4 40.0  MCV 99.5  --   MCH 31.6  --   MCHC 31.8  --   RDW 13.3  --   PLT 335  --    Cardiac EnzymesNo results for input(s): TROPONINI in the last 168 hours. No results for input(s): TROPIPOC in the last 168  hours.  BNP Recent Labs  Lab 10/14/20 2228  BNP 100.4*    DDimer No results for input(s): DDIMER in the last 168 hours.  Radiology/Studies:  DG Chest Port 1 View  Result Date: 10/14/2020 CLINICAL DATA:  Midsternal chest pain radiating into bilateral upper extremities. EXAM: PORTABLE CHEST 1 VIEW COMPARISON:  06/30/2020 FINDINGS: Overlying monitoring devices in place. Upper normal heart size with unchanged mediastinal contours. Aortic atherosclerosis. Interstitial coarsening with mild bronchial thickening in the lower lobes. No focal  airspace disease. Biapical pleuroparenchymal scarring. No pneumothorax or large pleural effusion. No acute osseous abnormalities are seen. IMPRESSION: Mild interstitial and bronchial thickening, which may be in part chronic. Aortic Atherosclerosis (ICD10-I70.0). Electronically Signed   By: Narda Rutherford M.D.   On: 10/14/2020 23:35    Assessment and Plan:   1. Atrial fibrillation with RVR.  It appears that her wide-complex tachycardia in the emergency department was likely her known atrial fibrillation.  Fortunately she has reverted to normal sinus rhythm and will continue her anticoagulation.  It is not clear to me a triggering event as she does not appear to have any infectious symptoms, appears to be stable from a heart failure perspective, and does not have symptoms of ischemia.  Her very mild troponin is likely in the setting of rapid heart rate and cardioversion.  Given her instability on presentation, I recommended observation to assure her ongoing stability.  Continue current home amiodarone, carvedilol, and apixaban. 2. Hypertension.  Continue home amlodipine, carvedilol, Entresto. 3. Chronic systolic heart failure, now with recovery of EF to 50%.  Continue carvedilol and Entresto along with spironolactone.  We will continue maintenance Lasix 40 mg.  Volume status appears stable. 4. GERD.  Continue PPI and famotidine. 5. COPD.  As needed albuterol. 6. Chronic pain.  Continue Cymbalta, gabapentin. 7. Anxiety.  Continue home clonazepam.  Severity of Illness: The appropriate patient status for this patient is OBSERVATION. Observation status is judged to be reasonable and necessary in order to provide the required intensity of service to ensure the patient's safety. The patient's presenting symptoms, physical exam findings, and initial radiographic and laboratory data in the context of their medical condition is felt to place them at decreased risk for further clinical deterioration.  Furthermore, it is anticipated that the patient will be medically stable for discharge from the hospital within 2 midnights of admission. The following factors support the patient status of observation.   " The patient's presenting symptoms include palpitations, chest pain. " The physical exam findings include n/a. " The initial radiographic and laboratory data are troponin 24.     For questions or updates, please contact [facility] Please consult [email or web address] for contact info under        Signed, Joellen Jersey, MD  10/15/2020 4:18 AM

## 2020-10-15 NOTE — Progress Notes (Addendum)
Progress Note  Patient Name: Julie Rice Date of Encounter: 10/15/2020  [facility] Cardiologist: Charlton Haws, MD   Subjective   Denies any CP or SOB  Inpatient Medications    Scheduled Meds:  amiodarone  200 mg Oral Daily   amLODipine  2.5 mg Oral Daily   apixaban  5 mg Oral BID   carvedilol  6.25 mg Oral BID WC   clonazePAM  0.25 mg Oral BID   DULoxetine  60 mg Oral QHS   famotidine  20 mg Oral Daily   gabapentin  100 mg Oral TID   pantoprazole  40 mg Oral Daily   polyvinyl alcohol  1 drop Both Eyes QHS   sacubitril-valsartan  1 tablet Oral BID   spironolactone  25 mg Oral Daily   Continuous Infusions:   PRN Meds: acetaminophen, albuterol, furosemide   Vital Signs    Vitals:   10/15/20 0445 10/15/20 0500 10/15/20 0515 10/15/20 0605  BP: (!) 130/52 (!) 137/54 (!) 140/54 (!) 128/48  Pulse: (!) 59 60 63   Resp: 14 13 15    Temp:    97.6 F (36.4 C)  TempSrc:    Oral  SpO2: 98% 100% 95%   Weight:    71.2 kg  Height:    5\' 1"  (1.549 m)    Intake/Output Summary (Last 24 hours) at 10/15/2020 0758 Last data filed at 10/15/2020 0055 Gross per 24 hour  Intake 2000 ml  Output --  Net 2000 ml   Last 3 Weights 10/15/2020 10/14/2020 10/13/2020  Weight (lbs) 157 lb 152 lb 1.9 oz 153 lb 12.8 oz  Weight (kg) 71.215 kg 69 kg 69.763 kg      Telemetry    NSR without recurrence of afib - Personally Reviewed  ECG    NSR with TWI in V1-V2 - Personally Reviewed  Physical Exam   GEN: No acute distress.   Neck: No JVD Cardiac: RRR, no murmurs, rubs, or gallops.  Respiratory: Clear to auscultation bilaterally. GI: Soft, nontender, non-distended  MS: No edema; No deformity. Neuro:  Nonfocal  Psych: Normal affect   Labs    High Sensitivity Troponin:   Recent Labs  Lab 10/15/20 0016  TROPONINIHS 24*      Chemistry Recent Labs  Lab 10/14/20 2234 10/15/20 0503  NA 139 141  K 4.8 4.9  CL 107 109  CO2  --  24  GLUCOSE 85 87  BUN 28* 21   CREATININE 1.00 0.89  CALCIUM  --  8.2*  GFRNONAA  --  >60  ANIONGAP  --  8     Hematology Recent Labs  Lab 10/14/20 2227 10/14/20 2234 10/15/20 0503  WBC 12.6*  --  8.7  RBC 3.86*  --  3.67*  HGB 12.2 13.6 11.6*  HCT 38.4 40.0 37.3  MCV 99.5  --  101.6*  MCH 31.6  --  31.6  MCHC 31.8  --  31.1  RDW 13.3  --  13.4  PLT 335  --  237    BNP Recent Labs  Lab 10/14/20 2228  BNP 100.4*     DDimer No results for input(s): DDIMER in the last 168 hours.   Radiology    DG Chest Port 1 View  Result Date: 10/14/2020 CLINICAL DATA:  Midsternal chest pain radiating into bilateral upper extremities. EXAM: PORTABLE CHEST 1 VIEW COMPARISON:  06/30/2020 FINDINGS: Overlying monitoring devices in place. Upper normal heart size with unchanged mediastinal contours. Aortic atherosclerosis. Interstitial coarsening with mild  bronchial thickening in the lower lobes. No focal airspace disease. Biapical pleuroparenchymal scarring. No pneumothorax or large pleural effusion. No acute osseous abnormalities are seen. IMPRESSION: Mild interstitial and bronchial thickening, which may be in part chronic. Aortic Atherosclerosis (ICD10-I70.0). Electronically Signed   By: Narda Rutherford M.D.   On: 10/14/2020 23:35    Cardiac Studies   Echo 01/12/2020 IMPRESSIONS    1. Left ventricular ejection fraction, by estimation, is 50 to 55%. The  left ventricle has low normal function. The left ventricle has no regional  wall motion abnormalities. Left ventricular diastolic parameters are  consistent with Grade I diastolic  dysfunction (impaired relaxation).   2. Right ventricular systolic function is normal. The right ventricular  size is normal. There is mildly elevated pulmonary artery systolic  pressure.   3. Left atrial size was mildly dilated.   4. The mitral valve is normal in structure. Mild mitral valve  regurgitation. No evidence of mitral stenosis.   5. The aortic valve is grossly normal.  Aortic valve regurgitation is  mild. No aortic stenosis is present.    Patient Profile     84 y.o. female with PMH of HTN, HLD, RA, COPD on home O2 presented to the ED with chest tightness and SOB and found to be in afib with RVR. She underwent urgent cardioversion due to hypotension. She was help overnight to allow BP to come up.   Assessment & Plan    1. PAF  - afib with aberrancy in Nov 2020, per patient report, also had brief afib on 06/30/2020, however she was already in sinus rhythm by the time she arrived in the ED. She attributed the august episode to missing few doses of medication.   - presented with SOB and burning sensation in the chest which is her usual symptom associated with afib. Underwent urgent DCCV in the ED, currently maintaining sinus rhythm. TSH normal  - since the last documented afib was more than a year ago, it would be reasonable to continue on the current medication. Discharge today  2. HTN: was hypotensive in afib, BP normalized.   3. Chronic systolic CHF with normalization of EF  4. COPD: on PRN home O2  For questions or updates, please contact [facility] Please consult [email or web address] for contact info under        Signed, Azalee Course, PA  10/15/2020, 7:58 AM    Looks good this am No CHF. In NSR this am Continue amiodarone and eliquis Ok to d/c home today exam with clear lungs stable hemodynamics no murmur and no edema She is wearing oxygen 2L at night and has pulmonary f/u as well  Charlton Haws MD [facility]

## 2020-10-15 NOTE — Discharge Instructions (Signed)

## 2020-10-25 ENCOUNTER — Other Ambulatory Visit: Payer: Self-pay | Admitting: Adult Health

## 2020-10-25 DIAGNOSIS — J9611 Chronic respiratory failure with hypoxia: Secondary | ICD-10-CM | POA: Diagnosis not present

## 2020-10-25 DIAGNOSIS — J449 Chronic obstructive pulmonary disease, unspecified: Secondary | ICD-10-CM | POA: Diagnosis not present

## 2020-10-25 DIAGNOSIS — Z760 Encounter for issue of repeat prescription: Secondary | ICD-10-CM

## 2020-11-14 NOTE — Progress Notes (Deleted)
Cardiology Office Note   Date:  11/14/2020   ID:  Julie Rice, DOB 1936-01-03, MRN [id number]  PCP:  Shirline Frees, NP  Cardiologist:  Dr. Eden Emms, MD    No chief complaint on file.   History of Present Illness: Julie Rice is a 85 y.o. female who presents for ER follow up, seen for Dr. Eden Emms.   Julie Rice has a hx of PAF 08/2019, HTN, HLD, non-ischemic CM with normalized EF, fibromyalgia, anemia, esophageal stricture status post balloon dilatation, large symptomatic hiatal hernia status post fundoplication, chronic dysphagia, asthma/COPD on oxygen at 2 L at home, bipolar disorder,rheumatoid arthritis, TIA and chronic urinary incontinence.   She underwent a [facility] 07/26/2019 showed only mild nonobstructive disease and low right heart pressures. She was dx with PAF in 08/2019 treated with amiodarone with conversion.   She presented to [facility] 10/2020 with acute onset of  palpitations, chest tightness, and shortness of breath. She typically is able to tell when she is in atrial fibrillation and this feels similarly. She has a prior of cardiomyopathy with a LBBB however has since had recovery of her EF.  On arrival to the emergency department, she was in a wide-complex tachycardia that appeared irregular with heart rates in the 140s. She was hypotensive and thus the emergency department did urgent cardioversion. Of note, she had missed a few days of her anticoagulation in the last week, however she was hemodynamically unstable and the ED appropriately proceeded with urgent cardioversion.  She was then seen by cardiology with symptom improvement post DCCV therefore plan was for d/c with close follow up.    1. PAF             - afib with aberrancy in Nov 2020, per patient report, also had brief afib on 06/30/2020, however she was already in sinus rhythm by the time she arrived in the ED. She attributed the august episode to missing few doses of medication.              -  presented with SOB and burning sensation in the chest which is her usual symptom associated with afib. Underwent urgent DCCV in the ED, currently maintaining sinus rhythm. TSH normal             - since the last documented afib was more than a year ago, it would be reasonable to continue on the current medication. Discharge today  2. HTN: was hypotensive in afib, BP normalized.   3. Chronic systolic CHF with normalization of EF  4. COPD: on PRN home O2   Past Medical History:  Diagnosis Date  . A-fib (HCC)   . Anemia   . Anxiety   . Bipolar disorder (HCC)   . Blood transfusion 1991   autologous pts own blood given   . CAD (coronary artery disease)   . Cataract   . Chest pain    "@ rest, lying down, w/exertion"  . CHF (congestive heart failure) (HCC) 07/2020  . Chronic back pain    "mostly lower back but I do have upper back pain regularly" (04/06/2018)  . COPD (chronic obstructive pulmonary disease) (HCC)   . Depression   . Esophageal stricture   . Fibromyalgia   . GERD (gastroesophageal reflux disease)   . Heart murmur    "slight" (04/06/2018)  . Hiatal hernia   . Hyperlipidemia    Patient denies  . Hypertension   . Internal hemorrhoids   . Lumbago   .  Mitral regurgitation   . Osteoarthritis   . Osteoporosis   . Pneumonia ~ 01/2018  . PONV (postoperative nausea and vomiting)    severe ponv, "in the past" (04/06/2018)  . Rectal bleeding   . Rheumatoid arthritis (HCC)   . Spondylosis   . TIA (transient ischemic attack) 2013  . Urinary incontinence    wears depends    Past Surgical History:  Procedure Laterality Date  . ABDOMINAL HYSTERECTOMY  1982  . BACK SURGERY    . BALLOON DILATION N/A 09/21/2018   Procedure: BALLOON DILATION;  Surgeon: Hilarie Fredrickson, MD;  Location: [facility] ENDOSCOPY;  Service: Endoscopy;  Laterality: N/A;  . BALLOON DILATION N/A 08/12/2019   Procedure: BALLOON DILATION;  Surgeon: Lynann Bologna, MD;  Location: [facility] ENDOSCOPY;  Service: Endoscopy;   Laterality: N/A;  . BIOPSY  08/12/2019   Procedure: BIOPSY;  Surgeon: Lynann Bologna, MD;  Location: [facility] ENDOSCOPY;  Service: Endoscopy;;  . BLADDER SUSPENSION  1980's  . BOTOX INJECTION N/A 09/21/2018   Procedure: BOTOX INJECTION;  Surgeon: Hilarie Fredrickson, MD;  Location: [facility] ENDOSCOPY;  Service: Endoscopy;  Laterality: N/A;  . BOTOX INJECTION N/A 08/12/2019   Procedure: BOTOX INJECTION;  Surgeon: Lynann Bologna, MD;  Location: [facility] ENDOSCOPY;  Service: Endoscopy;  Laterality: N/A;  . CATARACT EXTRACTION W/ INTRAOCULAR LENS  IMPLANT, BILATERAL Bilateral 2010  . DILATION AND CURETTAGE OF UTERUS  1961  . ESOPHAGEAL MANOMETRY N/A 03/25/2018   Procedure: ESOPHAGEAL MANOMETRY (EM);  Surgeon: Napoleon Form, MD;  Location: [facility] ENDOSCOPY;  Service: Endoscopy;  Laterality: N/A;  . ESOPHAGOGASTRODUODENOSCOPY (EGD) WITH PROPOFOL N/A 04/07/2018   Procedure: ESOPHAGOGASTRODUODENOSCOPY (EGD) WITH PROPOFOL;  Surgeon: Sherrilyn Rist, MD;  Location: [facility] ENDOSCOPY;  Service: Gastroenterology;  Laterality: N/A;  . ESOPHAGOGASTRODUODENOSCOPY (EGD) WITH PROPOFOL N/A 09/21/2018   Procedure: ESOPHAGOGASTRODUODENOSCOPY (EGD) WITH PROPOFOL;  Surgeon: Hilarie Fredrickson, MD;  Location: [facility] ENDOSCOPY;  Service: Endoscopy;  Laterality: N/A;  . ESOPHAGOGASTRODUODENOSCOPY (EGD) WITH PROPOFOL N/A 08/12/2019   Procedure: ESOPHAGOGASTRODUODENOSCOPY (EGD) WITH PROPOFOL;  Surgeon: Lynann Bologna, MD;  Location: [facility] ENDOSCOPY;  Service: Endoscopy;  Laterality: N/A;  . HERNIA REPAIR    . HIATAL HERNIA REPAIR N/A 08/02/2016   Procedure: LAPAROSCOPIC REPAIR OF LARGE  HIATAL HERNIA;  Surgeon: Glenna Fellows, MD;  Location: [facility] ORS;  Service: General;  Laterality: N/A;  . JOINT REPLACEMENT    . LAPAROSCOPIC NISSEN FUNDOPLICATION N/A 08/02/2016   Procedure: LAPAROSCOPIC NISSEN FUNDOPLICATION;  Surgeon: Glenna Fellows, MD;  Location: [facility] ORS;  Service: General;  Laterality: N/A;  . LUMBAR LAMINECTOMY  1990; 1994; [id number]   "I've got 2  stainless steel rods; 6 screws; 2 ray cages"took bone from right hip to put in back  . RIGHT/LEFT HEART CATH AND CORONARY ANGIOGRAPHY N/A 07/26/2019   Procedure: RIGHT/LEFT HEART CATH AND CORONARY ANGIOGRAPHY;  Surgeon: Lyn Records, MD;  Location: [facility] INVASIVE CV LAB;  Service: Cardiovascular;  Laterality: N/A;  . TEE WITHOUT CARDIOVERSION N/A 08/19/2019   Procedure: TRANSESOPHAGEAL ECHOCARDIOGRAM (TEE);  Surgeon: Lewayne Bunting, MD;  Location: [facility] ENDOSCOPY;  Service: Cardiovascular;  Laterality: N/A;  . TONSILLECTOMY AND ADENOIDECTOMY  1945  . TOTAL KNEE ARTHROPLASTY Right ~ 1996  . TUBAL LIGATION  ~ 1976     Current Outpatient Medications  Medication Sig Dispense Refill  . acetaminophen (TYLENOL) 500 MG tablet Take 1,000 mg by mouth every 6 (six) hours as needed for moderate pain, headache or fever.    Marland Kitchen albuterol (VENTOLIN HFA) 108 (90 Base) MCG/ACT inhaler TAKE  2 PUFFS BY MOUTH EVERY 6 HOURS AS NEEDED FOR WHEEZE OR SHORTNESS OF BREATH (Patient taking differently: Inhale 2 puffs into the lungs every 6 (six) hours as needed for wheezing or shortness of breath.) 4 g 2  . amiodarone (PACERONE) 200 MG tablet Take 1 tablet (200 mg total) by mouth daily. 90 tablet 3  . amLODipine (NORVASC) 2.5 MG tablet TAKE 1 TABLET BY MOUTH EVERY DAY (Patient taking differently: Take 2.5 mg by mouth daily.) 90 tablet 3  . apixaban (ELIQUIS) 5 MG TABS tablet Take 1 tablet (5 mg total) by mouth 2 (two) times daily. 180 tablet 1  . Carboxymethylcellulose Sodium (REFRESH TEARS OP) Place 1 drop into both eyes at bedtime.    . carvedilol (COREG) 6.25 MG tablet Take 1 tablet (6.25 mg total) by mouth 2 (two) times daily with a meal. 180 tablet 3  . clonazePAM (KLONOPIN) 0.5 MG tablet TAKE 1 TABLET BY MOUTH TWICE A DAY AS NEEDED FOR ANXIETY 60 tablet 1  . DULoxetine (CYMBALTA) 60 MG capsule TAKE 1 CAPSULE BY MOUTH EVERY DAY (Patient taking differently: Take 60 mg by mouth at bedtime.) 90 capsule 3  . famotidine  (PEPCID) 20 MG tablet Take 20 mg by mouth daily.    . furosemide (LASIX) 40 MG tablet TAKE 1 TABLET (40 MG TOTAL) BY MOUTH DAILY AS NEEDED FOR FLUID OR EDEMA. 90 tablet 1  . gabapentin (NEURONTIN) 100 MG capsule TAKE 1 CAPSULE BY MOUTH THREE TIMES A DAY (Patient taking differently: Take 100 mg by mouth 3 (three) times daily.) 270 capsule 1  . HYDROcodone-acetaminophen (NORCO) 5-325 MG tablet Take 1 tablet by mouth 3 (three) times daily as needed for severe pain (for really bad days). 20 tablet 0  . omeprazole (PRILOSEC) 20 MG capsule TAKE 1 CAPSULE BY MOUTH EVERY DAY (Patient taking differently: Take 20 mg by mouth daily.) 90 capsule 3  . sacubitril-valsartan (ENTRESTO) 97-103 MG Take 1 tablet by mouth 2 (two) times daily. 60 tablet 6  . spironolactone (ALDACTONE) 25 MG tablet Take 1 tablet (25 mg total) by mouth daily. 90 tablet 3  . traMADol (ULTRAM) 50 MG tablet Take 2 tablets (100 mg total) by mouth 2 (two) times daily. 120 tablet 5   No current facility-administered medications for this visit.    Allergies:   Morphine and Tape    Social History:  The patient  reports that she has never smoked. She has never used smokeless tobacco. She reports that she does not drink alcohol and does not use drugs.   Family History:  The patient's ***family history includes Heart attack in her brother; Heart disease in her brother; Heart disease (age of onset: 41) in her father; Hypertension in her mother; Kidney disease in her mother; Suicidality in her son.    ROS:  Please see the history of present illness.   Otherwise, review of systems are positive for {NONE DEFAULTED:18576::"none"}.   All other systems are reviewed and negative.    PHYSICAL EXAM: VS:  There were no vitals taken for this visit. , BMI There is no height or weight on file to calculate BMI. GEN: Well nourished, well developed, in no acute distress HEENT: normal Neck: no JVD, carotid bruits, or masses Cardiac: ***RRR; no murmurs,  rubs, or gallops,no edema  Respiratory:  clear to auscultation bilaterally, normal work of breathing GI: soft, nontender, nondistended, + BS MS: no deformity or atrophy Skin: warm and dry, no rash Neuro:  Strength and sensation are intact Psych:  euthymic mood, full affect   EKG:  EKG {ACTION; IS/IS WUJ:[id number]} ordered today. The ekg ordered today demonstrates ***   Recent Labs: 12/29/2019: NT-Pro BNP 552 03/09/2020: ALT 14 10/14/2020: B Natriuretic Peptide 100.4 10/15/2020: BUN 21; Creatinine, Ser 0.89; Hemoglobin 11.6; Platelets 237; Potassium 4.9; Sodium 141; TSH 2.023    Lipid Panel    Component Value Date/Time   CHOL 172 03/09/2020 0936   TRIG 111.0 03/09/2020 0936   HDL 65.60 03/09/2020 0936   CHOLHDL 3 03/09/2020 0936   VLDL 22.2 03/09/2020 0936   LDLCALC 84 03/09/2020 0936   LDLDIRECT 122.2 06/01/2013 0944      Wt Readings from Last 3 Encounters:  10/15/20 157 lb (71.2 kg)  10/13/20 153 lb 12.8 oz (69.8 kg)  09/05/20 145 lb 11.2 oz (66.1 kg)      Other studies Reviewed: Additional studies/ records that were reviewed today include: ***. Review of the above records demonstrates: ***  Echo 01/12/2020 IMPRESSIONS   1. Left ventricular ejection fraction, by estimation, is 50 to 55%. The  left ventricle has low normal function. The left ventricle has no regional  wall motion abnormalities. Left ventricular diastolic parameters are  consistent with Grade I diastolic  dysfunction (impaired relaxation).  2. Right ventricular systolic function is normal. The right ventricular  size is normal. There is mildly elevated pulmonary artery systolic  pressure.  3. Left atrial size was mildly dilated.  4. The mitral valve is normal in structure. Mild mitral valve  regurgitation. No evidence of mitral stenosis.  5. The aortic valve is grossly normal. Aortic valve regurgitation is  mild. No aortic stenosis is present.    ASSESSMENT AND PLAN:  1.   ***   Current medicines are reviewed at length with the patient today.  The patient {ACTIONS; HAS/DOES NOT HAVE:19233} concerns regarding medicines.  The following changes have been made:  {PLAN; NO CHANGE:13088:s}  Labs/ tests ordered today include: *** No orders of the defined types were placed in this encounter.    Disposition:   FU with *** in {gen number [id number]} {Days to years:10300}  Signed, Georgie Chard, NP  11/14/2020 3:46 PM    [facility] [address] [place], [place], [place]  [address] Phone: [phone]; Fax: [phone]

## 2020-11-20 ENCOUNTER — Other Ambulatory Visit: Payer: Self-pay

## 2020-11-20 ENCOUNTER — Other Ambulatory Visit: Payer: Self-pay | Admitting: Internal Medicine

## 2020-11-20 MED ORDER — SPIRONOLACTONE 25 MG PO TABS: 25.0000 mg | ORAL_TABLET | Freq: Every day | ORAL | 2 refills | Status: DC

## 2020-11-22 ENCOUNTER — Ambulatory Visit: Payer: Medicare Other | Admitting: Cardiology

## 2020-11-22 NOTE — Progress Notes (Signed)
Cardiology Office Note:    Date:  11/28/2020   ID:  Julie Rice, DOB [birth year], MRN [id number]  PCP:  Shirline Frees, NP  Cardiologist:  Charlton Haws, MD  Referring MD: Shirline Frees, NP   No chief complaint on file.   History of Present Illness:    Julie Rice is a 85 y.o. female with a past medical history significant for hypertension, hyperlipidemia, systolic CHF, fibromyalgia, anemia, esophageal stricture status post balloon dilatation, large symptomatic hiatal hernia status post fundoplication, chronic dysphagia, asthma/COPD on oxygen at 2 L at home, bipolar disorder, rheumatoid arthritis, TIA and chronic urinary incontinence.  Right and left heart cath on 07/26/2019 showed only mild nonobstructive disease and low right heart pressures.08/16/19 had PAF EF 40-45% by TTE with convertion using amiodarone Sees pulmonary with ambulatory desats on oxygen Having some headaches and low BP evening coreg held at times   Continues to have significant dyspnea    Echo 01/12/20 showed improved EF 50-55% just mild LAE, MR and AR  Tested positive for COVID 03/22/20 Not hospitalized Most recent CXR in Epic 06/30/20 showed ? Early LLL pneumonia Seen in ER for chest pain and dyspnea 06/30/20 Resolved spontaneously Troponin negative BNP only minimally elevated  Rx with Rocephin/ Augmentin for community acquired pneumonia Had run out of her Klonopin and ? Some BZ withdrawal   [facility] 10/15/20 Cardioverted in ER for hypotension She was continued on amiodarone 200 gm daily Along with coreg 6.25 bid   Cardiac studies   TEE 08/19/2019 IMPRESSIONS  1. Left ventricular ejection fraction, by visual estimation, is 25 to 30%. The left ventricle has severely decreased function. There is no left ventricular hypertrophy. 2. Global right ventricle has normal systolic function.The right ventricular size is normal. 3. Left atrial size was severely dilated. 4. Right atrial size  was moderately dilated. 5. Mild mitral annular calcification. 6. The mitral valve is normal in structure. Moderate mitral valve regurgitation. 7. The tricuspid valve is normal in structure. Tricuspid valve regurgitation moderate. 8. The aortic valve is tricuspid Aortic valve regurgitation is mild by color flow Doppler. Mild aortic valve sclerosis without stenosis. 9. The pulmonic valve was grossly normal. Pulmonic valve regurgitation is mild by color flow Doppler. 10. Mild plaque invoving the descending aorta. 11. Severe global reduction in LV systolic function; biatrial enlargement; atrial septal aneurysm; no LAA thrombus; mild AI; moderate MR and TR.   Echo 01/12/20  EF 50-55%  Mild LAE Mild MR Mild AR   RIGHT/LEFT HEART CATH AND CORONARY ANGIOGRAPHY  07/26/2019  Conclusion  Right dominant coronary anatomy  Widely patent left main  30 to 40% mid LAD  Widely patent circumflex  Luminal irregularities in a dominant right coronary.  Right heart pressures are relatively low. Pulmonary wedge pressure mean is 2 mmHg.  LVEDP 4 mmHg. Left ventriculography by hand-injection was not helpful. From images obtained, LV function appears relatively normal.  RECOMMENDATIONS:  Per treating team.  Relatively volume contracted and therefore we will give normal saline 100 cc/h for the next 4 hours.     Past Medical History:  Diagnosis Date  . A-fib (HCC)   . Anemia   . Anxiety   . Bipolar disorder (HCC)   . Blood transfusion 1991   autologous pts own blood given   . CAD (coronary artery disease)   . Cataract   . Chest pain    "@ rest, lying down, w/exertion"  . CHF (congestive heart failure) (HCC) 07/2020  .  Chronic back pain    "mostly lower back but I do have upper back pain regularly" (04/06/2018)  . COPD (chronic obstructive pulmonary disease) (HCC)   . Depression   . Esophageal stricture   . Fibromyalgia   . GERD (gastroesophageal reflux disease)   . Heart  murmur    "slight" (04/06/2018)  . Hiatal hernia   . Hyperlipidemia    Patient denies  . Hypertension   . Internal hemorrhoids   . Lumbago   . Mitral regurgitation   . Osteoarthritis   . Osteoporosis   . Pneumonia ~ 01/2018  . PONV (postoperative nausea and vomiting)    severe ponv, "in the past" (04/06/2018)  . Rectal bleeding   . Rheumatoid arthritis (HCC)   . Spondylosis   . TIA (transient ischemic attack) 2013  . Urinary incontinence    wears depends    Past Surgical History:  Procedure Laterality Date  . ABDOMINAL HYSTERECTOMY  1982  . BACK SURGERY    . BALLOON DILATION N/A 09/21/2018   Procedure: BALLOON DILATION;  Surgeon: Hilarie Fredrickson, MD;  Location: [facility] ENDOSCOPY;  Service: Endoscopy;  Laterality: N/A;  . BALLOON DILATION N/A 08/12/2019   Procedure: BALLOON DILATION;  Surgeon: Lynann Bologna, MD;  Location: [facility] ENDOSCOPY;  Service: Endoscopy;  Laterality: N/A;  . BIOPSY  08/12/2019   Procedure: BIOPSY;  Surgeon: Lynann Bologna, MD;  Location: [facility] ENDOSCOPY;  Service: Endoscopy;;  . BLADDER SUSPENSION  1980's  . BOTOX INJECTION N/A 09/21/2018   Procedure: BOTOX INJECTION;  Surgeon: Hilarie Fredrickson, MD;  Location: [facility] ENDOSCOPY;  Service: Endoscopy;  Laterality: N/A;  . BOTOX INJECTION N/A 08/12/2019   Procedure: BOTOX INJECTION;  Surgeon: Lynann Bologna, MD;  Location: [facility] ENDOSCOPY;  Service: Endoscopy;  Laterality: N/A;  . CATARACT EXTRACTION W/ INTRAOCULAR LENS  IMPLANT, BILATERAL Bilateral 2010  . DILATION AND CURETTAGE OF UTERUS  1961  . ESOPHAGEAL MANOMETRY N/A 03/25/2018   Procedure: ESOPHAGEAL MANOMETRY (EM);  Surgeon: Napoleon Form, MD;  Location: [facility] ENDOSCOPY;  Service: Endoscopy;  Laterality: N/A;  . ESOPHAGOGASTRODUODENOSCOPY (EGD) WITH PROPOFOL N/A 04/07/2018   Procedure: ESOPHAGOGASTRODUODENOSCOPY (EGD) WITH PROPOFOL;  Surgeon: Sherrilyn Rist, MD;  Location: [facility] ENDOSCOPY;  Service: Gastroenterology;  Laterality: N/A;  . ESOPHAGOGASTRODUODENOSCOPY (EGD) WITH  PROPOFOL N/A 09/21/2018   Procedure: ESOPHAGOGASTRODUODENOSCOPY (EGD) WITH PROPOFOL;  Surgeon: Hilarie Fredrickson, MD;  Location: [facility] ENDOSCOPY;  Service: Endoscopy;  Laterality: N/A;  . ESOPHAGOGASTRODUODENOSCOPY (EGD) WITH PROPOFOL N/A 08/12/2019   Procedure: ESOPHAGOGASTRODUODENOSCOPY (EGD) WITH PROPOFOL;  Surgeon: Lynann Bologna, MD;  Location: [facility] ENDOSCOPY;  Service: Endoscopy;  Laterality: N/A;  . HERNIA REPAIR    . HIATAL HERNIA REPAIR N/A 08/02/2016   Procedure: LAPAROSCOPIC REPAIR OF LARGE  HIATAL HERNIA;  Surgeon: Glenna Fellows, MD;  Location: [facility] ORS;  Service: General;  Laterality: N/A;  . JOINT REPLACEMENT    . LAPAROSCOPIC NISSEN FUNDOPLICATION N/A 08/02/2016   Procedure: LAPAROSCOPIC NISSEN FUNDOPLICATION;  Surgeon: Glenna Fellows, MD;  Location: [facility] ORS;  Service: General;  Laterality: N/A;  . LUMBAR LAMINECTOMY  1990; 1994; 3299;2426   "I've got 2 stainless steel rods; 6 screws; 2 ray cages"took bone from right hip to put in back  . RIGHT/LEFT HEART CATH AND CORONARY ANGIOGRAPHY N/A 07/26/2019   Procedure: RIGHT/LEFT HEART CATH AND CORONARY ANGIOGRAPHY;  Surgeon: Lyn Records, MD;  Location: [facility] INVASIVE CV LAB;  Service: Cardiovascular;  Laterality: N/A;  . TEE WITHOUT CARDIOVERSION N/A 08/19/2019   Procedure: TRANSESOPHAGEAL ECHOCARDIOGRAM (TEE);  Surgeon: Jens Som,  Madolyn Frieze, MD;  Location: [facility] ENDOSCOPY;  Service: Cardiovascular;  Laterality: N/A;  . TONSILLECTOMY AND ADENOIDECTOMY  1945  . TOTAL KNEE ARTHROPLASTY Right ~ 1996  . TUBAL LIGATION  ~ 1976    Current Medications: Current Meds  Medication Sig  . acetaminophen (TYLENOL) 500 MG tablet Take 1,000 mg by mouth every 6 (six) hours as needed for moderate pain, headache or fever.  Marland Kitchen albuterol (VENTOLIN HFA) 108 (90 Base) MCG/ACT inhaler TAKE 2 PUFFS BY MOUTH EVERY 6 HOURS AS NEEDED FOR WHEEZE OR SHORTNESS OF BREATH  . amiodarone (PACERONE) 200 MG tablet Take 1 tablet (200 mg total) by mouth daily.  Marland Kitchen amLODipine (NORVASC)  2.5 MG tablet TAKE 1 TABLET BY MOUTH EVERY DAY  . apixaban (ELIQUIS) 5 MG TABS tablet Take 1 tablet (5 mg total) by mouth 2 (two) times daily.  . Carboxymethylcellulose Sodium (REFRESH TEARS OP) Place 1 drop into both eyes at bedtime.  . carvedilol (COREG) 6.25 MG tablet Take 1 tablet (6.25 mg total) by mouth 2 (two) times daily with a meal.  . clonazePAM (KLONOPIN) 0.5 MG tablet TAKE 1 TABLET BY MOUTH TWICE A DAY AS NEEDED FOR ANXIETY  . DULoxetine (CYMBALTA) 60 MG capsule TAKE 1 CAPSULE BY MOUTH EVERY DAY  . furosemide (LASIX) 40 MG tablet TAKE 1 TABLET (40 MG TOTAL) BY MOUTH DAILY AS NEEDED FOR FLUID OR EDEMA.  Marland Kitchen gabapentin (NEURONTIN) 100 MG capsule TAKE 1 CAPSULE BY MOUTH THREE TIMES A DAY  . HYDROcodone-acetaminophen (NORCO) 5-325 MG tablet Take 1 tablet by mouth 3 (three) times daily as needed for severe pain (for really bad days).  Marland Kitchen omeprazole (PRILOSEC) 20 MG capsule TAKE 1 CAPSULE BY MOUTH EVERY DAY  . sacubitril-valsartan (ENTRESTO) 97-103 MG Take 1 tablet by mouth 2 (two) times daily.  Marland Kitchen spironolactone (ALDACTONE) 25 MG tablet Take 1 tablet (25 mg total) by mouth daily.  . traMADol (ULTRAM) 50 MG tablet Take 2 tablets (100 mg total) by mouth 2 (two) times daily.     Allergies:   Morphine and Tape   Social History   Socioeconomic History  . Marital status: Widowed    Spouse name: Not on file  . Number of children: 2  . Years of education: Not on file  . Highest education level: Not on file  Occupational History  . Occupation: retired    Associate Professor: RETIRED  Tobacco Use  . Smoking status: Never Smoker  . Smokeless tobacco: Never Used  Vaping Use  . Vaping Use: Never used  Substance and Sexual Activity  . Alcohol use: Never  . Drug use: Never  . Sexual activity: Not Currently    Comment: 1st intercourse 56 yo-1 partner  Other Topics Concern  . Not on file  Social History Narrative   Retired    Widowed   Social Determinants of Corporate investment banker Strain:  Not on [employer] Insecurity: Not on file  Transportation Needs: Not on file  Physical Activity: Not on file  Stress: Not on file  Social Connections: Not on file     Family History: The patient's family history includes Heart attack in her brother; Heart disease in her brother; Heart disease (age of onset: 76) in her father; Hypertension in her mother; Kidney disease in her mother; Suicidality in her son. There is no history of Colon cancer, Esophageal cancer, Pancreatic cancer, Rectal cancer, or Stomach cancer. ROS:   Please see the history of present illness.     All  other systems reviewed and are negative.   EKG:   11/11/19 sinus rhythm, LAD, LBBB, 63 bpm, machine read QTC is 519 however by my measurement QTC is more like 440.  Recent Labs: 12/29/2019: NT-Pro BNP 552 03/09/2020: ALT 14 10/14/2020: B Natriuretic Peptide 100.4 10/15/2020: BUN 21; Creatinine, Ser 0.89; Hemoglobin 11.6; Platelets 237; Potassium 4.9; Sodium 141; TSH 2.023   Recent Lipid Panel    Component Value Date/Time   CHOL 172 03/09/2020 0936   TRIG 111.0 03/09/2020 0936   HDL 65.60 03/09/2020 0936   CHOLHDL 3 03/09/2020 0936   VLDL 22.2 03/09/2020 0936   LDLCALC 84 03/09/2020 0936   LDLDIRECT 122.2 06/01/2013 0944    Physical Exam:    VS:  BP 106/64   Pulse 70   Ht 5\' 1"  (1.549 m)   Wt 71.2 kg   SpO2 98%   BMI 29.66 kg/m     Wt Readings from Last 6 Encounters:  11/28/20 71.2 kg  10/15/20 71.2 kg  10/13/20 69.8 kg  09/05/20 66.1 kg  08/07/20 64.2 kg  07/21/20 66.2 kg     Affect appropriate Chronically ill female  HEENT: normal Neck supple with no adenopathy JVP normal no bruits no thyromegaly Lungs clear with no wheezing and good diaphragmatic motion Heart:  S1/S2 no murmur, no rub, gallop or click PMI normal Abdomen: benighn, BS positve, no tenderness, no AAA no bruit.  No HSM or HJR Distal pulses intact with no bruits No edema Neuro non-focal Skin warm and dry No muscular  weakness   ASSESSMENT:    No diagnosis found. PLAN:    In order of problems listed above:  Chronic systolic heart failure: -EF improved by TTE March 50-55% continue medical Rx cath no obstructive CAD 07/26/19  only mildly elevated right heart pressures  - If BP remains low may need to cut back on Entresto dose   Atrial fibrillation: - most recent episdoe 10/24/20 post [facility] with low BP stop norvasc  -CHADSVASC score is 7.  Patient continues on Eliquis. No unusual bleeding.   Nonobstructive CAD: -by cardiac catheterization on September 21. -No angina  NSVT: -Patient had some episodes of NSVT in the hospital on telemetry.  On beta-blocker. No perceived recurrences.   Pulmonary:  COPD on 2L at home Covid positive May and abnormal CXR 06/24/20 with LLL pneumonia RX with Rocephin and Augmentin F/U  Primary Care and or Pulmonary   Needs COVID vaccine will try to facilitate gave her [phone] number to get vaccine   Medication Adjustments/Labs and Tests Ordered: Current medicines are reviewed at length with the patient today.  Concerns regarding medicines are outlined above. Labs and tests ordered and medication changes are outlined in the patient instructions below:  There are no Patient Instructions on file for this visit.   Signed, [phone], MD  11/28/2020 11:32 AM    [facility]

## 2020-11-25 DIAGNOSIS — J449 Chronic obstructive pulmonary disease, unspecified: Secondary | ICD-10-CM | POA: Diagnosis not present

## 2020-11-25 DIAGNOSIS — J9611 Chronic respiratory failure with hypoxia: Secondary | ICD-10-CM | POA: Diagnosis not present

## 2020-11-27 ENCOUNTER — Other Ambulatory Visit: Payer: Self-pay | Admitting: Internal Medicine

## 2020-11-28 ENCOUNTER — Other Ambulatory Visit: Payer: Self-pay

## 2020-11-28 ENCOUNTER — Encounter: Payer: Self-pay | Admitting: Cardiovascular Disease

## 2020-11-28 ENCOUNTER — Ambulatory Visit: Payer: Medicare Other | Admitting: Cardiovascular Disease

## 2020-11-28 VITALS — BP 106/64 | HR 70 | Ht 61.0 in | Wt 157.0 lb

## 2020-11-28 DIAGNOSIS — I480 Paroxysmal atrial fibrillation: Secondary | ICD-10-CM | POA: Diagnosis not present

## 2020-11-28 DIAGNOSIS — I5022 Chronic systolic (congestive) heart failure: Secondary | ICD-10-CM | POA: Diagnosis not present

## 2020-11-28 NOTE — Patient Instructions (Addendum)
Medication Instructions:  Your physician has recommended you make the following change in your medication:  1-STOP amlodipine   *If you need a refill on your cardiac medications before your next appointment, please call your pharmacy*  Lab Work: If you have labs (blood work) drawn today and your tests are completely normal, you will receive your results only by: Marland Kitchen MyChart Message (if you have MyChart) OR . A paper copy in the mail If you have any lab test that is abnormal or we need to change your treatment, we will call you to review the results.  Testing/Procedures: None ordered today.  Follow-Up: At [facility], you and your health needs are our priority.  As part of our continuing mission to provide you with exceptional heart care, we have created designated Provider Care Teams.  These Care Teams include your primary Cardiologist (physician) and Advanced Practice Providers (APPs -  Physician Assistants and Nurse Practitioners) who all work together to provide you with the care you need, when you need it.  We recommend signing up for the patient portal called "MyChart".  Sign up information is provided on this After Visit Summary.  MyChart is used to connect with patients for Virtual Visits (Telemedicine).  Patients are able to view lab/test results, encounter notes, upcoming appointments, etc.  Non-urgent messages can be sent to your provider as well.   To learn more about what you can do with MyChart, go to [email or web address].    Your next appointment:   6 month(s)  The format for your next appointment:   In Person  Provider:   You may see Charlton Haws, MD or one of the following Advanced Practice Providers on your designated Care Team:    Nada Boozer, NP  Georgie Chard, NP  COVID-19 Vaccine Information can be found at: [email or web address] For questions related to vaccine distribution or appointments, please  email [email or web address] or call [phone].

## 2020-12-01 ENCOUNTER — Other Ambulatory Visit: Payer: Self-pay

## 2020-12-01 ENCOUNTER — Other Ambulatory Visit: Payer: Self-pay | Admitting: Adult Health

## 2020-12-01 ENCOUNTER — Other Ambulatory Visit: Payer: Self-pay | Admitting: Cardiovascular Disease

## 2020-12-01 ENCOUNTER — Other Ambulatory Visit: Payer: Self-pay | Admitting: Internal Medicine

## 2020-12-01 ENCOUNTER — Encounter
Payer: Medicare Other | Attending: Physical Medicine and Rehabilitation | Admitting: Physical Medicine and Rehabilitation

## 2020-12-01 ENCOUNTER — Encounter: Payer: Self-pay | Admitting: Physical Medicine and Rehabilitation

## 2020-12-01 VITALS — BP 117/73 | HR 71 | Temp 98.5°F | Ht 61.0 in | Wt 157.0 lb

## 2020-12-01 DIAGNOSIS — Z79891 Long term (current) use of opiate analgesic: Secondary | ICD-10-CM

## 2020-12-01 DIAGNOSIS — M7918 Myalgia, other site: Secondary | ICD-10-CM | POA: Insufficient documentation

## 2020-12-01 DIAGNOSIS — M797 Fibromyalgia: Secondary | ICD-10-CM | POA: Insufficient documentation

## 2020-12-01 DIAGNOSIS — G894 Chronic pain syndrome: Secondary | ICD-10-CM | POA: Insufficient documentation

## 2020-12-01 DIAGNOSIS — Z5181 Encounter for therapeutic drug level monitoring: Secondary | ICD-10-CM | POA: Insufficient documentation

## 2020-12-01 MED ORDER — HYDROCODONE-ACETAMINOPHEN 5-325 MG PO TABS: 1.0000 | ORAL_TABLET | Freq: Four times a day (QID) | ORAL | 0 refills | Status: DC | PRN

## 2020-12-01 NOTE — Progress Notes (Signed)
Patient is an 85 yr old female with hx of . Moderate or severe neural foraminal stenosis at the left T1, right T6, and bilateral T9 through T12 nerve levelswith secondary myofascial pain.Also with Afib, on Amiodarone/ and Eliquis.    Was hospitalized in December 2021.  Gets tightness/burning sensation in chest when got Afib back- Ambulance called.   BP got to 85/46 before went to Ambulance/ER.  Got shocked back into rhythm.  It worked!   Using four pronged cane today- using regularly.   Last injections in upper back did SO much better.  Low back injections were helpful as well, but upper back MUCH better.    Still taking tramadol but not hydrocodone  Has spray on aspercreme for low back- like roll on better. Used this AM for pain.    Plan: 1. Cannot give her tramadol anymore due to prolonged QTc at 539 on 10/16/20.   2. Will change to Norco 5/325 mg 4x/day as needed for pain- since cannot take Tramadol  3. Patient here for trigger point injections for  Consent done and on chart.  Cleaned areas with alcohol and injected using a 27 gauge 1.5 inch needle  Injected 6cc Using 1% Lidocaine with no EPI  Upper traps B/L Levators B/L Posterior scalenes  Middle scalenes Splenius Capitus Pectoralis Major B/L Rhomboids B/L x3 Infraspinatus Teres Major/minor Thoracic paraspinals B/L Lumbar paraspinals B/L x3 Other injections-    Patient's level of pain prior was 7/10 Current level of pain after injections is 4/10- better ROM as well  There was no bleeding or complications.  Patient was advised to drink a lot of water on day after injections to flush system Will have increased soreness for 12-48 hours after injections.  Can use Lidocaine patches the day AFTER injections Can use theracane on day of injections in places didn't inject Can use heating pad or ice 4-6 hours AFTER injections  4. F/U in 6 weeks.    I spent a total of 30 minutes on visit- as detailed  above.

## 2020-12-01 NOTE — Patient Instructions (Signed)
Plan: 1. Cannot give her tramadol anymore due to prolonged QTc at 539 on 10/16/20. Called pharmacy to verify while in room with pt.   2. Will change to Norco 5/325 mg 4x/day as needed for pain- since cannot take Tramadol  3. Patient here for trigger point injections for  Consent done and on chart.  Cleaned areas with alcohol and injected using a 27 gauge 1.5 inch needle  Injected 6cc Using 1% Lidocaine with no EPI  Upper traps B/L Levators B/L Posterior scalenes  Middle scalenes Splenius Capitus Pectoralis Major B/L Rhomboids B/L x3 Infraspinatus Teres Major/minor Thoracic paraspinals B/L Lumbar paraspinals B/L x3 Other injections-    Patient's level of pain prior was 7/10 Current level of pain after injections is 4/10- better ROM as well  There was no bleeding or complications.  Patient was advised to drink a lot of water on day after injections to flush system Will have increased soreness for 12-48 hours after injections.  Can use Lidocaine patches the day AFTER injections Can use theracane on day of injections in places didn't inject Can use heating pad or ice 4-6 hours AFTER injections  4. F/U in 6 weeks.

## 2020-12-05 NOTE — Telephone Encounter (Signed)
Sent to the pharmacy by e-scribe. 

## 2020-12-08 LAB — TOXASSURE SELECT,+ANTIDEPR,UR

## 2020-12-11 ENCOUNTER — Telehealth: Payer: Self-pay | Admitting: *Deleted

## 2020-12-11 NOTE — Telephone Encounter (Signed)
Urine drug screen for this encounter is consistent for prescribed medication 

## 2020-12-19 ENCOUNTER — Other Ambulatory Visit: Payer: Self-pay

## 2020-12-19 ENCOUNTER — Ambulatory Visit (INDEPENDENT_AMBULATORY_CARE_PROVIDER_SITE_OTHER): Payer: Medicare Other

## 2020-12-19 VITALS — BP 110/78 | HR 66 | Temp 98.0°F | Resp 20 | Wt 158.4 lb

## 2020-12-19 DIAGNOSIS — Z0000 Encounter for general adult medical examination without abnormal findings: Secondary | ICD-10-CM

## 2020-12-19 NOTE — Patient Instructions (Addendum)
Julie Rice , Thank you for taking time to come for your Medicare Wellness Visit. I appreciate your ongoing commitment to your health goals. Please review the following plan we discussed and let me know if I can assist you in the future.   Screening recommendations/referrals: Colonoscopy: No longer required  Mammogram: Done 05/10/14 Bone Density: Done 10/13/19 Recommended yearly ophthalmology/optometry visit for glaucoma screening and checkup Recommended yearly dental visit for hygiene and checkup  Vaccinations: Influenza vaccine: Done 10/15/20 Pneumococcal vaccine: Up to date Tdap vaccine: Due and discussed  Shingles vaccine: Shingrix discussed. Please contact your pharmacy for coverage information.    Covid-19:Declined and discussed   Advanced directives: Please bring a copy of your health care power of attorney and living will to the office at your convenience.  Conditions/risks identified: Stay healthy  Next appointment: Follow up in one year for your annual wellness visit    Preventive Care 65 Years and Older, Female Preventive care refers to lifestyle choices and visits with your health care provider that can promote health and wellness. What does preventive care include?  A yearly physical exam. This is also called an annual well check.  Dental exams once or twice a year.  Routine eye exams. Ask your health care provider how often you should have your eyes checked.  Personal lifestyle choices, including:  Daily care of your teeth and gums.  Regular physical activity.  Eating a healthy diet.  Avoiding tobacco and drug use.  Limiting alcohol use.  Practicing safe sex.  Taking low-dose aspirin every day.  Taking vitamin and mineral supplements as recommended by your health care provider. What happens during an annual well check? The services and screenings done by your health care provider during your annual well check will depend on your age, overall health,  lifestyle risk factors, and family history of disease. Counseling  Your health care provider may ask you questions about your:  Alcohol use.  Tobacco use.  Drug use.  Emotional well-being.  Home and relationship well-being.  Sexual activity.  Eating habits.  History of falls.  Memory and ability to understand (cognition).  Work and work Astronomer.  Reproductive health. Screening  You may have the following tests or measurements:  Height, weight, and BMI.  Blood pressure.  Lipid and cholesterol levels. These may be checked every 5 years, or more frequently if you are over 73 years old.  Skin check.  Lung cancer screening. You may have this screening every year starting at age 82 if you have a 30-pack-year history of smoking and currently smoke or have quit within the past 15 years.  Fecal occult blood test (FOBT) of the stool. You may have this test every year starting at age 70.  Flexible sigmoidoscopy or colonoscopy. You may have a sigmoidoscopy every 5 years or a colonoscopy every 10 years starting at age 21.  Hepatitis C blood test.  Hepatitis B blood test.  Sexually transmitted disease (STD) testing.  Diabetes screening. This is done by checking your blood sugar (glucose) after you have not eaten for a while (fasting). You may have this done every 1-3 years.  Bone density scan. This is done to screen for osteoporosis. You may have this done starting at age 7.  Mammogram. This may be done every 1-2 years. Talk to your health care provider about how often you should have regular mammograms. Talk with your health care provider about your test results, treatment options, and if necessary, the need for more tests. Vaccines  Your health care provider may recommend certain vaccines, such as:  Influenza vaccine. This is recommended every year.  Tetanus, diphtheria, and acellular pertussis (Tdap, Td) vaccine. You may need a Td booster every 10 years.  Zoster  vaccine. You may need this after age 66.  Pneumococcal 13-valent conjugate (PCV13) vaccine. One dose is recommended after age 21.  Pneumococcal polysaccharide (PPSV23) vaccine. One dose is recommended after age 70. Talk to your health care provider about which screenings and vaccines you need and how often you need them. This information is not intended to replace advice given to you by your health care provider. Make sure you discuss any questions you have with your health care provider. Document Released: 11/17/2015 Document Revised: 07/10/2016 Document Reviewed: 08/22/2015 Elsevier Interactive Patient Education  2017 [employer] Prevention in the Home Falls can cause injuries. They can happen to people of all ages. There are many things you can do to make your home safe and to help prevent falls. What can I do on the outside of my home?  Regularly fix the edges of walkways and driveways and fix any cracks.  Remove anything that might make you trip as you walk through a door, such as a raised step or threshold.  Trim any bushes or trees on the path to your home.  Use bright outdoor lighting.  Clear any walking paths of anything that might make someone trip, such as rocks or tools.  Regularly check to see if handrails are loose or broken. Make sure that both sides of any steps have handrails.  Any raised decks and porches should have guardrails on the edges.  Have any leaves, snow, or ice cleared regularly.  Use sand or salt on walking paths during winter.  Clean up any spills in your garage right away. This includes oil or grease spills. What can I do in the bathroom?  Use night lights.  Install grab bars by the toilet and in the tub and shower. Do not use towel bars as grab bars.  Use non-skid mats or decals in the tub or shower.  If you need to sit down in the shower, use a plastic, non-slip stool.  Keep the floor dry. Clean up any water that spills on the  floor as soon as it happens.  Remove soap buildup in the tub or shower regularly.  Attach bath mats securely with double-sided non-slip rug tape.  Do not have throw rugs and other things on the floor that can make you trip. What can I do in the bedroom?  Use night lights.  Make sure that you have a light by your bed that is easy to reach.  Do not use any sheets or blankets that are too big for your bed. They should not hang down onto the floor.  Have a firm chair that has side arms. You can use this for support while you get dressed.  Do not have throw rugs and other things on the floor that can make you trip. What can I do in the kitchen?  Clean up any spills right away.  Avoid walking on wet floors.  Keep items that you use a lot in easy-to-reach places.  If you need to reach something above you, use a strong step stool that has a grab bar.  Keep electrical cords out of the way.  Do not use floor polish or wax that makes floors slippery. If you must use wax, use non-skid floor wax.  Do  not have throw rugs and other things on the floor that can make you trip. What can I do with my stairs?  Do not leave any items on the stairs.  Make sure that there are handrails on both sides of the stairs and use them. Fix handrails that are broken or loose. Make sure that handrails are as long as the stairways.  Check any carpeting to make sure that it is firmly attached to the stairs. Fix any carpet that is loose or worn.  Avoid having throw rugs at the top or bottom of the stairs. If you do have throw rugs, attach them to the floor with carpet tape.  Make sure that you have a light switch at the top of the stairs and the bottom of the stairs. If you do not have them, ask someone to add them for you. What else can I do to help prevent falls?  Wear shoes that:  Do not have high heels.  Have rubber bottoms.  Are comfortable and fit you well.  Are closed at the toe. Do not wear  sandals.  If you use a stepladder:  Make sure that it is fully opened. Do not climb a closed stepladder.  Make sure that both sides of the stepladder are locked into place.  Ask someone to hold it for you, if possible.  Clearly mark and make sure that you can see:  Any grab bars or handrails.  First and last steps.  Where the edge of each step is.  Use tools that help you move around (mobility aids) if they are needed. These include:  Canes.  Walkers.  Scooters.  Crutches.  Turn on the lights when you go into a dark area. Replace any light bulbs as soon as they burn out.  Set up your furniture so you have a clear path. Avoid moving your furniture around.  If any of your floors are uneven, fix them.  If there are any pets around you, be aware of where they are.  Review your medicines with your doctor. Some medicines can make you feel dizzy. This can increase your chance of falling. Ask your doctor what other things that you can do to help prevent falls. This information is not intended to replace advice given to you by your health care provider. Make sure you discuss any questions you have with your health care provider. Document Released: 08/17/2009 Document Revised: 03/28/2016 Document Reviewed: 11/25/2014 Elsevier Interactive Patient Education  2017 [employer].

## 2020-12-19 NOTE — Progress Notes (Signed)
Subjective:   Julie Rice is a 85 y.o. female who presents for Medicare Annual (Subsequent) preventive examination.  Review of Systems     Cardiac Risk Factors include: dyslipidemia;hypertension;advanced age (>22men, >53 women)     Objective:    Today's Vitals   12/19/20 1351  BP: 110/78  Pulse: 66  Resp: 20  Temp: 98 F (36.7 C)  SpO2: 97%  Weight: 158 lb 6.4 oz (71.8 kg)   Body mass index is 29.93 kg/m.  Advanced Directives 12/19/2020 10/15/2020 08/16/2019 08/08/2019 08/07/2019 07/23/2019 02/04/2019  Does Patient Have a Medical Advance Directive? Yes No;Yes Yes Yes Unable to assess, patient is non-responsive or altered mental status Yes Yes  Type of Advance Directive Living will Living will Healthcare Power of [place];Living will Healthcare Power of [place];Living will - Healthcare Power of [employer] of [address];Living will  Does patient want to make changes to medical advance directive? - Yes (ED - send information to MyChart) No - Patient declined - No - Guardian declined Yes (Inpatient - patient defers changing a medical advance directive at this time - Information given) -  Copy of Healthcare Power of Attorney in Chart? - - No - copy requested, Physician notified Yes - validated most recent copy scanned in chart (See row information) No - copy requested, Physician notified No - copy requested -  Some encounter information is confidential and restricted. Go to Review Flowsheets activity to see all data.    Current Medications (verified) Outpatient Encounter Medications as of 12/19/2020  Medication Sig  . acetaminophen (TYLENOL) 500 MG tablet Take 1,000 mg by mouth every 6 (six) hours as needed for moderate pain, headache or fever.  Marland Kitchen albuterol (VENTOLIN HFA) 108 (90 Base) MCG/ACT inhaler TAKE 2 PUFFS BY MOUTH EVERY 6 HOURS AS NEEDED FOR WHEEZE OR SHORTNESS OF BREATH  . amiodarone (PACERONE) 200 MG tablet Take 1 tablet (200 mg total) by mouth daily.  Marland Kitchen  apixaban (ELIQUIS) 5 MG TABS tablet Take 1 tablet (5 mg total) by mouth 2 (two) times daily.  . Carboxymethylcellulose Sodium (REFRESH TEARS OP) Place 1 drop into both eyes at bedtime.  . carvedilol (COREG) 6.25 MG tablet Take 1 tablet (6.25 mg total) by mouth 2 (two) times daily with a meal.  . clonazePAM (KLONOPIN) 0.5 MG tablet TAKE 1 TABLET BY MOUTH TWICE A DAY AS NEEDED FOR ANXIETY  . DULoxetine (CYMBALTA) 60 MG capsule TAKE 1 CAPSULE BY MOUTH EVERY DAY  . ENTRESTO 97-103 MG TAKE 1 TABLET BY MOUTH TWICE A DAY  . furosemide (LASIX) 40 MG tablet Take 40 mg by mouth. As needed  . gabapentin (NEURONTIN) 100 MG capsule TAKE 1 CAPSULE BY MOUTH THREE TIMES A DAY  . omeprazole (PRILOSEC) 20 MG capsule TAKE 1 CAPSULE BY MOUTH EVERY DAY  . spironolactone (ALDACTONE) 25 MG tablet Take 1 tablet (25 mg total) by mouth daily.  Marland Kitchen HYDROcodone-acetaminophen (NORCO) 5-325 MG tablet Take 1 tablet by mouth every 6 (six) hours as needed for severe pain (i place of Tramadol due to prolonged QTc). (Patient not taking: Reported on 12/19/2020)  . [DISCONTINUED] furosemide (LASIX) 40 MG tablet TAKE 1 TABLET (40 MG TOTAL) BY MOUTH DAILY AS NEEDED FOR FLUID OR EDEMA. (Patient not taking: Reported on 12/19/2020)  . [DISCONTINUED] traMADol (ULTRAM) 50 MG tablet Take 2 tablets (100 mg total) by mouth 2 (two) times daily. (Patient not taking: Reported on 12/19/2020)   No facility-administered encounter medications on file as of 12/19/2020.    Allergies (  verified) Morphine, Tape, and Tramadol   History: Past Medical History:  Diagnosis Date  . A-fib (HCC)   . Anemia   . Anxiety   . Bipolar disorder (HCC)   . Blood transfusion 1991   autologous pts own blood given   . CAD (coronary artery disease)   . Cataract   . Chest pain    "@ rest, lying down, w/exertion"  . CHF (congestive heart failure) (HCC) 07/2020  . Chronic back pain    "mostly lower back but I do have upper back pain regularly" (04/06/2018)  . COPD  (chronic obstructive pulmonary disease) (HCC)   . Depression   . Esophageal stricture   . Fibromyalgia   . GERD (gastroesophageal reflux disease)   . Heart murmur    "slight" (04/06/2018)  . Hiatal hernia   . Hyperlipidemia    Patient denies  . Hypertension   . Internal hemorrhoids   . Lumbago   . Mitral regurgitation   . Osteoarthritis   . Osteoporosis   . Pneumonia ~ 01/2018  . PONV (postoperative nausea and vomiting)    severe ponv, "in the past" (04/06/2018)  . Rectal bleeding   . Rheumatoid arthritis (HCC)   . Spondylosis   . TIA (transient ischemic attack) 2013  . Urinary incontinence    wears depends   Past Surgical History:  Procedure Laterality Date  . ABDOMINAL HYSTERECTOMY  1982  . BACK SURGERY    . BALLOON DILATION N/A 09/21/2018   Procedure: BALLOON DILATION;  Surgeon: Hilarie Fredrickson, MD;  Location: [facility] ENDOSCOPY;  Service: Endoscopy;  Laterality: N/A;  . BALLOON DILATION N/A 08/12/2019   Procedure: BALLOON DILATION;  Surgeon: Lynann Bologna, MD;  Location: [facility] ENDOSCOPY;  Service: Endoscopy;  Laterality: N/A;  . BIOPSY  08/12/2019   Procedure: BIOPSY;  Surgeon: Lynann Bologna, MD;  Location: [facility] ENDOSCOPY;  Service: Endoscopy;;  . BLADDER SUSPENSION  1980's  . BOTOX INJECTION N/A 09/21/2018   Procedure: BOTOX INJECTION;  Surgeon: Hilarie Fredrickson, MD;  Location: [facility] ENDOSCOPY;  Service: Endoscopy;  Laterality: N/A;  . BOTOX INJECTION N/A 08/12/2019   Procedure: BOTOX INJECTION;  Surgeon: Lynann Bologna, MD;  Location: [facility] ENDOSCOPY;  Service: Endoscopy;  Laterality: N/A;  . CATARACT EXTRACTION W/ INTRAOCULAR LENS  IMPLANT, BILATERAL Bilateral 2010  . DILATION AND CURETTAGE OF UTERUS  1961  . ESOPHAGEAL MANOMETRY N/A 03/25/2018   Procedure: ESOPHAGEAL MANOMETRY (EM);  Surgeon: Napoleon Form, MD;  Location: [facility] ENDOSCOPY;  Service: Endoscopy;  Laterality: N/A;  . ESOPHAGOGASTRODUODENOSCOPY (EGD) WITH PROPOFOL N/A 04/07/2018   Procedure: ESOPHAGOGASTRODUODENOSCOPY (EGD) WITH  PROPOFOL;  Surgeon: Sherrilyn Rist, MD;  Location: [facility] ENDOSCOPY;  Service: Gastroenterology;  Laterality: N/A;  . ESOPHAGOGASTRODUODENOSCOPY (EGD) WITH PROPOFOL N/A 09/21/2018   Procedure: ESOPHAGOGASTRODUODENOSCOPY (EGD) WITH PROPOFOL;  Surgeon: Hilarie Fredrickson, MD;  Location: [facility] ENDOSCOPY;  Service: Endoscopy;  Laterality: N/A;  . ESOPHAGOGASTRODUODENOSCOPY (EGD) WITH PROPOFOL N/A 08/12/2019   Procedure: ESOPHAGOGASTRODUODENOSCOPY (EGD) WITH PROPOFOL;  Surgeon: Lynann Bologna, MD;  Location: [facility] ENDOSCOPY;  Service: Endoscopy;  Laterality: N/A;  . HERNIA REPAIR    . HIATAL HERNIA REPAIR N/A 08/02/2016   Procedure: LAPAROSCOPIC REPAIR OF LARGE  HIATAL HERNIA;  Surgeon: Glenna Fellows, MD;  Location: [facility] ORS;  Service: General;  Laterality: N/A;  . JOINT REPLACEMENT    . LAPAROSCOPIC NISSEN FUNDOPLICATION N/A 08/02/2016   Procedure: LAPAROSCOPIC NISSEN FUNDOPLICATION;  Surgeon: Glenna Fellows, MD;  Location: [facility] ORS;  Service: General;  Laterality: N/A;  . LUMBAR LAMINECTOMY  1990; 1994; [id number]   "I've got 2 stainless steel rods; 6 screws; 2 ray cages"took bone from right hip to put in back  . RIGHT/LEFT HEART CATH AND CORONARY ANGIOGRAPHY N/A 07/26/2019   Procedure: RIGHT/LEFT HEART CATH AND CORONARY ANGIOGRAPHY;  Surgeon: Lyn Records, MD;  Location: [facility] INVASIVE CV LAB;  Service: Cardiovascular;  Laterality: N/A;  . TEE WITHOUT CARDIOVERSION N/A 08/19/2019   Procedure: TRANSESOPHAGEAL ECHOCARDIOGRAM (TEE);  Surgeon: Lewayne Bunting, MD;  Location: [facility] ENDOSCOPY;  Service: Cardiovascular;  Laterality: N/A;  . TONSILLECTOMY AND ADENOIDECTOMY  1945  . TOTAL KNEE ARTHROPLASTY Right ~ 1996  . TUBAL LIGATION  ~ 1976   Family History  Problem Relation Age of Onset  . Kidney disease Mother   . Hypertension Mother   . Heart disease Father 55       die of MI at age 68  . Suicidality Son   . Heart disease Brother   . Heart attack Brother   . Colon cancer Neg Hx   . Esophageal cancer Neg  Hx   . Pancreatic cancer Neg Hx   . Rectal cancer Neg Hx   . Stomach cancer Neg Hx    Social History   Socioeconomic History  . Marital status: Widowed    Spouse name: Not on file  . Number of children: 2  . Years of education: Not on file  . Highest education level: Not on file  Occupational History  . Occupation: retired    Associate Professor: RETIRED  Tobacco Use  . Smoking status: Never Smoker  . Smokeless tobacco: Never Used  Vaping Use  . Vaping Use: Never used  Substance and Sexual Activity  . Alcohol use: Never  . Drug use: Never  . Sexual activity: Not Currently    Comment: 1st intercourse 48 yo-1 partner  Other Topics Concern  . Not on file  Social History Narrative   Retired    Widowed   Social Determinants of Corporate investment banker Strain: Low Risk   . Difficulty of Paying Living Expenses: Not hard at all  Food Insecurity: No Food Insecurity  . Worried About Programme researcher, broadcasting/film/video in the Last Year: Never true  . Ran Out of Food in the Last Year: Never true  Transportation Needs: No Transportation Needs  . Lack of Transportation (Medical): No  . Lack of Transportation (Non-Medical): No  Physical Activity: Inactive  . Days of Exercise per Week: 0 days  . Minutes of Exercise per Session: 0 min  Stress: Stress Concern Present  . Feeling of Stress : To some extent  Social Connections: Socially Isolated  . Frequency of Communication with Friends and Family: Twice a week  . Frequency of Social Gatherings with Friends and Family: Never  . Attends Religious Services: 1 to 4 times per year  . Active Member of Clubs or Organizations: No  . Attends Banker Meetings: Never  . Marital Status: Widowed    Tobacco Counseling Counseling given: Not Answered   Clinical Intake:  Pre-visit preparation completed: Yes  Pain : No/denies pain     BMI - recorded: 29.93 Nutritional Status: BMI 25 -29 Overweight Nutritional Risks: None Diabetes: No  How  often do you need to have someone help you when you read instructions, pamphlets, or other written materials from your doctor or pharmacy?: 1 - Never  Diabetic?No  Interpreter Needed?: No  Information entered by :: Lanier Ensign, LPN   Activities of Daily Living In your  present state of health, do you have any difficulty performing the following activities: 12/19/2020 10/15/2020  Hearing? N N  Vision? N N  Difficulty concentrating or making decisions? N N  Walking or climbing stairs? Y N  Dressing or bathing? N N  Doing errands, shopping? N N  Preparing Food and eating ? N -  Using the Toilet? N -  In the past six months, have you accidently leaked urine? Y -  Comment incontinient -  Do you have problems with loss of bowel control? N -  Managing your Medications? N -  Managing your Finances? N -  Housekeeping or managing your Housekeeping? N -  Some recent data might be hidden    Patient Care Team: Shirline Frees, NP as PCP - General (Family Medicine) Wendall Stade, MD as PCP - Cardiology (Cardiology)  Indicate any recent Medical Services you may have received from other than [facility] providers in the past year (date may be approximate).     Assessment:   This is a routine wellness examination for Azhar.  Hearing/Vision screen  Hearing Screening   125Hz  250Hz  500Hz  1000Hz  2000Hz  3000Hz  4000Hz  6000Hz  8000Hz   Right ear:           Left ear:           Comments: Pt denies any hearing issues   Vision Screening Comments: Pt follows dr Hazle Quant for annual eye exams  Dietary issues and exercise activities discussed: Current Exercise Habits: The patient does not participate in regular exercise at present  Goals    . Patient Stated     Stay healthy      Depression Screen PHQ 2/9 Scores 12/19/2020 10/13/2020 08/07/2020 02/16/2020 04/11/2015 06/01/2013  PHQ - 2 Score 1 0 2 2 1  0  PHQ- 9 Score - - - 10 - -    Fall Risk Fall Risk  12/19/2020 10/13/2020 08/07/2020 04/10/2020  03/01/2020  Falls in the past year? 1 0 0 1 0  Number falls in past yr: 1 0 0 0 -  Injury with Fall? 1 0 0 1 -  Comment hit head tripped over tubing - - - -  Risk for fall due to : Impaired balance/gait;History of fall(s);Impaired mobility;Impaired vision - - - -  Follow up Falls prevention discussed - - - -    FALL RISK PREVENTION PERTAINING TO THE HOME:  Any stairs in or around the home? No  If so, are there any without handrails? No  Home free of loose throw rugs in walkways, pet beds, electrical cords, etc? Yes  Adequate lighting in your home to reduce risk of falls? Yes   ASSISTIVE DEVICES UTILIZED TO PREVENT FALLS:  Life alert? No  Use of a cane, walker or w/c? Yes  Grab bars in the bathroom? No  Shower chair or bench in shower? No  Elevated toilet seat or a handicapped toilet? No   TIMED UP AND GO:  Was the test performed? Yes .  Length of time to ambulate 10 feet: 10 sec.   Gait slow and steady with assistive device  Cognitive Function:     6CIT Screen 12/19/2020  What Year? 0 points  What month? 0 points  Count back from 20 0 points  Months in reverse 0 points  Repeat phrase 0 points    Immunizations Immunization History  Administered Date(s) Administered  . Fluad Quad(high Dose 65+) 07/24/2019, 10/15/2020  . Influenza Whole 09/04/2007, 08/16/2008  . Influenza, High Dose Seasonal PF 08/04/2018  .  Influenza,inj,Quad PF,6+ Mos 08/17/2013, 09/02/2014  . Pneumococcal Conjugate-13 09/02/2014  . Pneumococcal Polysaccharide-23 06/01/2013  . Td 04/08/2006  . Zoster 04/08/2006    TDAP status: Due, Education has been provided regarding the importance of this vaccine. Advised may receive this vaccine at local pharmacy or Health Dept. Aware to provide a copy of the vaccination record if obtained from local pharmacy or Health Dept. Verbalized acceptance and understanding.  Flu Vaccine status: Up to date  Pneumococcal vaccine status: Up to date  Covid-19 vaccine  status: Declined, Education has been provided regarding the importance of this vaccine but patient still declined. Advised may receive this vaccine at local pharmacy or Health Dept.or vaccine clinic. Aware to provide a copy of the vaccination record if obtained from local pharmacy or Health Dept. Verbalized acceptance and understanding.  Qualifies for Shingles Vaccine? Yes   Zostavax completed Yes   Shingrix Completed?: No.    Education has been provided regarding the importance of this vaccine. Patient has been advised to call insurance company to determine out of pocket expense if they have not yet received this vaccine. Advised may also receive vaccine at local pharmacy or Health Dept. Verbalized acceptance and understanding.  Screening Tests Health Maintenance  Topic Date Due  . COVID-19 Vaccine (1) Never done  . TETANUS/TDAP  04/08/2016  . INFLUENZA VACCINE  Completed  . DEXA SCAN  Completed  . PNA vac Low Risk Adult  Completed    Health Maintenance  Health Maintenance Due  Topic Date Due  . COVID-19 Vaccine (1) Never done  . TETANUS/TDAP  04/08/2016    Colorectal cancer screening: No longer required.   Mammogram status: Completed 05/10/14. Repeat every year  Bone Density status: Completed 10/13/19. Results reflect: Bone density results: OSTEOPENIA. Repeat every 2 years.  Additional Screening:   Vision Screening: Recommended annual ophthalmology exams for early detection of glaucoma and other disorders of the eye. Is the patient up to date with their annual eye exam?  Yes  Who is the provider or what is the name of the office in which the patient attends annual eye exams? [facility] If pt is not established with a provider, would they like to be referred to a provider to establish care? No .   Dental Screening: Recommended annual dental exams for proper oral hygiene  Community Resource Referral / Chronic Care Management: CRR required this visit?  No   CCM required this  visit?  No      Plan:     I have personally reviewed and noted the following in the patient's chart:   . Medical and social history . Use of alcohol, tobacco or illicit drugs  . Current medications and supplements . Functional ability and status . Nutritional status . Physical activity . Advanced directives . List of other physicians . Hospitalizations, surgeries, and ER visits in previous 12 months . Vitals . Screenings to include cognitive, depression, and falls . Referrals and appointments  In addition, I have reviewed and discussed with patient certain preventive protocols, quality metrics, and best practice recommendations. A written personalized care plan for preventive services as well as general preventive health recommendations were provided to patient.     Marzella Schlein, LPN   02/10/8118   Nurse Notes: None

## 2020-12-20 ENCOUNTER — Ambulatory Visit: Payer: Medicare Other | Admitting: Adult Health

## 2020-12-26 DIAGNOSIS — J449 Chronic obstructive pulmonary disease, unspecified: Secondary | ICD-10-CM | POA: Diagnosis not present

## 2020-12-26 DIAGNOSIS — J9611 Chronic respiratory failure with hypoxia: Secondary | ICD-10-CM | POA: Diagnosis not present

## 2020-12-27 ENCOUNTER — Other Ambulatory Visit: Payer: Self-pay | Admitting: Adult Health

## 2020-12-28 ENCOUNTER — Other Ambulatory Visit: Payer: Self-pay

## 2020-12-29 ENCOUNTER — Encounter: Payer: Self-pay | Admitting: Adult Health

## 2020-12-29 ENCOUNTER — Ambulatory Visit (INDEPENDENT_AMBULATORY_CARE_PROVIDER_SITE_OTHER): Payer: Medicare Other | Admitting: Adult Health

## 2020-12-29 VITALS — BP 134/70 | Temp 98.0°F | Wt 158.0 lb

## 2020-12-29 DIAGNOSIS — L259 Unspecified contact dermatitis, unspecified cause: Secondary | ICD-10-CM

## 2020-12-29 DIAGNOSIS — G6289 Other specified polyneuropathies: Secondary | ICD-10-CM

## 2020-12-29 NOTE — Progress Notes (Signed)
Subjective:     Patient ID: Julie Rice, female   DOB: 07/30/1936, 85 y.o.   MRN: [id number]  HPI   85 year old female who  has a past medical history of A-fib (HCC), Anemia, Anxiety, Bipolar disorder (HCC), Blood transfusion (1991), CAD (coronary artery disease), Cataract, Chest pain, CHF (congestive heart failure) (HCC) (07/2020), Chronic back pain, COPD (chronic obstructive pulmonary disease) (HCC), Depression, Esophageal stricture, Fibromyalgia, GERD (gastroesophageal reflux disease), Heart murmur, Hiatal hernia, Hyperlipidemia, Hypertension, Internal hemorrhoids, Lumbago, Mitral regurgitation, Osteoarthritis, Osteoporosis, Pneumonia (~ 01/2018), PONV (postoperative nausea and vomiting), Rectal bleeding, Rheumatoid arthritis (HCC), Spondylosis, TIA (transient ischemic attack) (2013), and Urinary incontinence.   She presents to the office today for ongoing lower extremity discomfort that is described as "burning and tingling throughout her lower extremities.  This discomfort is worse in the evening when she is going to bed.  She is prescribed gabapentin 100 mg 3 times daily for neuropathic pain and is also seen physical medicine and rehab for chronic pain syndrome, fibromyalgia, and myofascial pain.  She was recently switched from tramadol to low-dose Norco due to prolonged QTc interval.  She reports that she does not like taking narcotics unless she has to and has not had any of the Norco just yet.  She continues with the gabapentin.  She also has a rash on the right side of her nose that developed this week.  She denies itching.  No changes in shampoos or detergents.  Has been applying over-the-counter cortisone cream and believes that this is working   Review of Systems See HPI   Past Medical History:  Diagnosis Date  . A-fib (HCC)   . Anemia   . Anxiety   . Bipolar disorder (HCC)   . Blood transfusion 1991   autologous pts own blood given   . CAD (coronary artery disease)   .  Cataract   . Chest pain    "@ rest, lying down, w/exertion"  . CHF (congestive heart failure) (HCC) 07/2020  . Chronic back pain    "mostly lower back but I do have upper back pain regularly" (04/06/2018)  . COPD (chronic obstructive pulmonary disease) (HCC)   . Depression   . Esophageal stricture   . Fibromyalgia   . GERD (gastroesophageal reflux disease)   . Heart murmur    "slight" (04/06/2018)  . Hiatal hernia   . Hyperlipidemia    Patient denies  . Hypertension   . Internal hemorrhoids   . Lumbago   . Mitral regurgitation   . Osteoarthritis   . Osteoporosis   . Pneumonia ~ 01/2018  . PONV (postoperative nausea and vomiting)    severe ponv, "in the past" (04/06/2018)  . Rectal bleeding   . Rheumatoid arthritis (HCC)   . Spondylosis   . TIA (transient ischemic attack) 2013  . Urinary incontinence    wears depends    Social History   Socioeconomic History  . Marital status: Widowed    Spouse name: Not on file  . Number of children: 2  . Years of education: Not on file  . Highest education level: Not on file  Occupational History  . Occupation: retired    Associate Professor: RETIRED  Tobacco Use  . Smoking status: Never Smoker  . Smokeless tobacco: Never Used  Vaping Use  . Vaping Use: Never used  Substance and Sexual Activity  . Alcohol use: Never  . Drug use: Never  . Sexual activity: Not Currently    Comment: 1st intercourse  20 yo-1 partner  Other Topics Concern  . Not on file  Social History Narrative   Retired    Widowed   Social Determinants of Corporate investment banker Strain: Low Risk   . Difficulty of Paying Living Expenses: Not hard at all  Food Insecurity: No Food Insecurity  . Worried About Programme researcher, broadcasting/film/video in the Last Year: Never true  . Ran Out of Food in the Last Year: Never true  Transportation Needs: No Transportation Needs  . Lack of Transportation (Medical): No  . Lack of Transportation (Non-Medical): No  Physical Activity: Inactive  .  Days of Exercise per Week: 0 days  . Minutes of Exercise per Session: 0 min  Stress: Stress Concern Present  . Feeling of Stress : To some extent  Social Connections: Socially Isolated  . Frequency of Communication with Friends and Family: Twice a week  . Frequency of Social Gatherings with Friends and Family: Never  . Attends Religious Services: 1 to 4 times per year  . Active Member of Clubs or Organizations: No  . Attends Banker Meetings: Never  . Marital Status: Widowed  Intimate Partner Violence: Not At Risk  . Fear of Current or Ex-Partner: No  . Emotionally Abused: No  . Physically Abused: No  . Sexually Abused: No    Past Surgical History:  Procedure Laterality Date  . ABDOMINAL HYSTERECTOMY  1982  . BACK SURGERY    . BALLOON DILATION N/A 09/21/2018   Procedure: BALLOON DILATION;  Surgeon: Hilarie Fredrickson, MD;  Location: [facility] ENDOSCOPY;  Service: Endoscopy;  Laterality: N/A;  . BALLOON DILATION N/A 08/12/2019   Procedure: BALLOON DILATION;  Surgeon: Lynann Bologna, MD;  Location: [facility] ENDOSCOPY;  Service: Endoscopy;  Laterality: N/A;  . BIOPSY  08/12/2019   Procedure: BIOPSY;  Surgeon: Lynann Bologna, MD;  Location: [facility] ENDOSCOPY;  Service: Endoscopy;;  . BLADDER SUSPENSION  1980's  . BOTOX INJECTION N/A 09/21/2018   Procedure: BOTOX INJECTION;  Surgeon: Hilarie Fredrickson, MD;  Location: [facility] ENDOSCOPY;  Service: Endoscopy;  Laterality: N/A;  . BOTOX INJECTION N/A 08/12/2019   Procedure: BOTOX INJECTION;  Surgeon: Lynann Bologna, MD;  Location: [facility] ENDOSCOPY;  Service: Endoscopy;  Laterality: N/A;  . CATARACT EXTRACTION W/ INTRAOCULAR LENS  IMPLANT, BILATERAL Bilateral 2010  . DILATION AND CURETTAGE OF UTERUS  1961  . ESOPHAGEAL MANOMETRY N/A 03/25/2018   Procedure: ESOPHAGEAL MANOMETRY (EM);  Surgeon: Napoleon Form, MD;  Location: [facility] ENDOSCOPY;  Service: Endoscopy;  Laterality: N/A;  . ESOPHAGOGASTRODUODENOSCOPY (EGD) WITH PROPOFOL N/A 04/07/2018   Procedure:  ESOPHAGOGASTRODUODENOSCOPY (EGD) WITH PROPOFOL;  Surgeon: Sherrilyn Rist, MD;  Location: [facility] ENDOSCOPY;  Service: Gastroenterology;  Laterality: N/A;  . ESOPHAGOGASTRODUODENOSCOPY (EGD) WITH PROPOFOL N/A 09/21/2018   Procedure: ESOPHAGOGASTRODUODENOSCOPY (EGD) WITH PROPOFOL;  Surgeon: Hilarie Fredrickson, MD;  Location: [facility] ENDOSCOPY;  Service: Endoscopy;  Laterality: N/A;  . ESOPHAGOGASTRODUODENOSCOPY (EGD) WITH PROPOFOL N/A 08/12/2019   Procedure: ESOPHAGOGASTRODUODENOSCOPY (EGD) WITH PROPOFOL;  Surgeon: Lynann Bologna, MD;  Location: [facility] ENDOSCOPY;  Service: Endoscopy;  Laterality: N/A;  . HERNIA REPAIR    . HIATAL HERNIA REPAIR N/A 08/02/2016   Procedure: LAPAROSCOPIC REPAIR OF LARGE  HIATAL HERNIA;  Surgeon: Glenna Fellows, MD;  Location: [facility] ORS;  Service: General;  Laterality: N/A;  . JOINT REPLACEMENT    . LAPAROSCOPIC NISSEN FUNDOPLICATION N/A 08/02/2016   Procedure: LAPAROSCOPIC NISSEN FUNDOPLICATION;  Surgeon: Glenna Fellows, MD;  Location: [facility] ORS;  Service: General;  Laterality: N/A;  .  LUMBAR LAMINECTOMY  1990; 1994; [id number]   "I've got 2 stainless steel rods; 6 screws; 2 ray cages"took bone from right hip to put in back  . RIGHT/LEFT HEART CATH AND CORONARY ANGIOGRAPHY N/A 07/26/2019   Procedure: RIGHT/LEFT HEART CATH AND CORONARY ANGIOGRAPHY;  Surgeon: Lyn Records, MD;  Location: [facility] INVASIVE CV LAB;  Service: Cardiovascular;  Laterality: N/A;  . TEE WITHOUT CARDIOVERSION N/A 08/19/2019   Procedure: TRANSESOPHAGEAL ECHOCARDIOGRAM (TEE);  Surgeon: Lewayne Bunting, MD;  Location: [facility] ENDOSCOPY;  Service: Cardiovascular;  Laterality: N/A;  . TONSILLECTOMY AND ADENOIDECTOMY  1945  . TOTAL KNEE ARTHROPLASTY Right ~ 1996  . TUBAL LIGATION  ~ 1976    Family History  Problem Relation Age of Onset  . Kidney disease Mother   . Hypertension Mother   . Heart disease Father 22       die of MI at age 41  . Suicidality Son   . Heart disease Brother   . Heart attack Brother   . Colon  cancer Neg Hx   . Esophageal cancer Neg Hx   . Pancreatic cancer Neg Hx   . Rectal cancer Neg Hx   . Stomach cancer Neg Hx     Allergies  Allergen Reactions  . Morphine Other (See Comments)    Flushing, rash, itching  . Tape Other (See Comments)    Band aides, adhesive tape Redness and pulls skin off  . Tramadol Other (See Comments)    Pt has prolonged Qtc interval of 540- cannot give tramadol per pharmacy    Current Outpatient Medications on File Prior to Visit  Medication Sig Dispense Refill  . acetaminophen (TYLENOL) 500 MG tablet Take 1,000 mg by mouth every 6 (six) hours as needed for moderate pain, headache or fever.    Marland Kitchen albuterol (VENTOLIN HFA) 108 (90 Base) MCG/ACT inhaler TAKE 2 PUFFS BY MOUTH EVERY 6 HOURS AS NEEDED FOR WHEEZE OR SHORTNESS OF BREATH 4 g 2  . amiodarone (PACERONE) 200 MG tablet Take 1 tablet (200 mg total) by mouth daily. 90 tablet 3  . apixaban (ELIQUIS) 5 MG TABS tablet Take 1 tablet (5 mg total) by mouth 2 (two) times daily. 180 tablet 1  . Carboxymethylcellulose Sodium (REFRESH TEARS OP) Place 1 drop into both eyes at bedtime.    . carvedilol (COREG) 6.25 MG tablet Take 1 tablet (6.25 mg total) by mouth 2 (two) times daily with a meal. 180 tablet 3  . clonazePAM (KLONOPIN) 0.5 MG tablet TAKE 1 TABLET BY MOUTH TWICE A DAY AS NEEDED FOR ANXIETY 60 tablet 1  . DULoxetine (CYMBALTA) 60 MG capsule TAKE 1 CAPSULE BY MOUTH EVERY DAY 90 capsule 3  . ENTRESTO 97-103 MG TAKE 1 TABLET BY MOUTH TWICE A DAY 180 tablet 3  . furosemide (LASIX) 40 MG tablet Take 40 mg by mouth. As needed    . gabapentin (NEURONTIN) 100 MG capsule TAKE 1 CAPSULE BY MOUTH THREE TIMES A DAY 270 capsule 2  . omeprazole (PRILOSEC) 20 MG capsule TAKE 1 CAPSULE BY MOUTH EVERY DAY 90 capsule 3  . spironolactone (ALDACTONE) 25 MG tablet Take 1 tablet (25 mg total) by mouth daily. 90 tablet 2   No current facility-administered medications on file prior to visit.    BP 134/70   Temp 98 F  (36.7 C)   Wt 158 lb (71.7 kg)   BMI 29.85 kg/m       Objective:   Physical Exam Vitals and nursing note reviewed.  Constitutional:      Appearance: Normal appearance.  Cardiovascular:     Rate and Rhythm: Normal rate and regular rhythm.     Pulses: Normal pulses.     Heart sounds: Normal heart sounds.  Pulmonary:     Effort: Pulmonary effort is normal.     Breath sounds: Normal breath sounds.  Abdominal:     General: Abdomen is flat.     Palpations: Abdomen is soft.  Musculoskeletal:        General: Normal range of motion.  Skin:    General: Skin is warm and dry.     Findings: Erythema (right side of nose. Appears as contact dermatitis ) present.  Neurological:     General: No focal deficit present.     Mental Status: She is alert and oriented to person, place, and time.  Psychiatric:        Mood and Affect: Mood normal.        Behavior: Behavior normal.        Thought Content: Thought content normal.        Judgment: Judgment normal.        Assessment:        Plan:     1. Other polyneuropathy -Advised that she can increase her nightly gabapentin dose from 100 mg to 2 to 300 mg or try using the Norco that was prescribed by pain management in the evening.  Advised to let me know if she increases the gabapentin. -Follow-up if no improvement  2. Contact dermatitis, unspecified contact dermatitis type, unspecified trigger -Continue with over-the-counter cortisone cream for the next week if she is believes that this is helping. -Follow-up as needed.me  Shirline Frees, NP

## 2020-12-29 NOTE — Patient Instructions (Signed)
You can increase the gabapentin by 1-2 tabs at night to see if that helps with the burning pain. Or take one of the Norco that physical medicine and rehab prescribed.   Let me know which one you decide

## 2021-01-02 ENCOUNTER — Ambulatory Visit: Payer: Medicare Other | Admitting: Cardiovascular Disease

## 2021-01-02 ENCOUNTER — Other Ambulatory Visit: Payer: Self-pay | Admitting: Adult Health

## 2021-01-02 ENCOUNTER — Other Ambulatory Visit: Payer: Self-pay | Admitting: Cardiovascular Disease

## 2021-01-02 DIAGNOSIS — Z760 Encounter for issue of repeat prescription: Secondary | ICD-10-CM

## 2021-01-12 ENCOUNTER — Encounter: Payer: Medicare Other | Admitting: Physical Medicine and Rehabilitation

## 2021-01-12 DIAGNOSIS — M1712 Unilateral primary osteoarthritis, left knee: Secondary | ICD-10-CM | POA: Diagnosis not present

## 2021-01-12 DIAGNOSIS — M25562 Pain in left knee: Secondary | ICD-10-CM | POA: Diagnosis not present

## 2021-01-23 DIAGNOSIS — J449 Chronic obstructive pulmonary disease, unspecified: Secondary | ICD-10-CM | POA: Diagnosis not present

## 2021-01-23 DIAGNOSIS — J9611 Chronic respiratory failure with hypoxia: Secondary | ICD-10-CM | POA: Diagnosis not present

## 2021-02-02 DIAGNOSIS — M1712 Unilateral primary osteoarthritis, left knee: Secondary | ICD-10-CM | POA: Diagnosis not present

## 2021-02-06 ENCOUNTER — Other Ambulatory Visit: Payer: Self-pay | Admitting: Internal Medicine

## 2021-02-12 ENCOUNTER — Ambulatory Visit (INDEPENDENT_AMBULATORY_CARE_PROVIDER_SITE_OTHER): Payer: Medicare Other | Admitting: Family Medicine

## 2021-02-12 ENCOUNTER — Other Ambulatory Visit: Payer: Self-pay

## 2021-02-12 ENCOUNTER — Encounter: Payer: Self-pay | Admitting: Family Medicine

## 2021-02-12 VITALS — BP 122/60 | HR 78 | Temp 97.8°F | Wt 155.7 lb

## 2021-02-12 DIAGNOSIS — R252 Cramp and spasm: Secondary | ICD-10-CM | POA: Diagnosis not present

## 2021-02-12 DIAGNOSIS — R0600 Dyspnea, unspecified: Secondary | ICD-10-CM | POA: Diagnosis not present

## 2021-02-12 DIAGNOSIS — R0781 Pleurodynia: Secondary | ICD-10-CM | POA: Diagnosis not present

## 2021-02-12 NOTE — Progress Notes (Signed)
Established Patient Office Visit  Subjective:  Patient ID: Julie Rice, female    DOB: 24-May-1936  Age: 85 y.o. MRN: [id number]  CC:  Chief Complaint  Patient presents with  . Shortness of Breath    SOB, L rib pain when taking a deep breath, no other symptoms    HPI Julie Rice presents for left-sided rib pain and shortness of breath over the past couple days.  Her past medical history significant for atrial fibrillation, history of congestive heart failure, hypertension, COPD, rheumatoid arthritis.  She states 2 nights ago she noticed some left rib pain.  This is worse with deep breathing but also somewhat sore to touch.  She is on Eliquis twice daily at baseline.  No cough.  No fever.  No known injury.  Using Tylenol with moderate relief.  She had echocardiogram 3/21 which showed ejection fraction 50 to 55%.  Denies any recent increased peripheral edema.  No left-sided anterior chest pain.  Her pain is more on the lateral rib region and is reproducible to touch.  Denies any recent increased peripheral edema.  Current medications include amiodarone, Eliquis, carvedilol, Cymbalta, Entresto, Lasix as needed, gabapentin, Prilosec, and Aldactone  Past Medical History:  Diagnosis Date  . A-fib (HCC)   . Anemia   . Anxiety   . Bipolar disorder (HCC)   . Blood transfusion 1991   autologous pts own blood given   . CAD (coronary artery disease)   . Cataract   . Chest pain    "@ rest, lying down, w/exertion"  . CHF (congestive heart failure) (HCC) 07/2020  . Chronic back pain    "mostly lower back but I do have upper back pain regularly" (04/06/2018)  . COPD (chronic obstructive pulmonary disease) (HCC)   . Depression   . Esophageal stricture   . Fibromyalgia   . GERD (gastroesophageal reflux disease)   . Heart murmur    "slight" (04/06/2018)  . Hiatal hernia   . Hyperlipidemia    Patient denies  . Hypertension   . Internal hemorrhoids   . Lumbago   . Mitral regurgitation    . Osteoarthritis   . Osteoporosis   . Pneumonia ~ 01/2018  . PONV (postoperative nausea and vomiting)    severe ponv, "in the past" (04/06/2018)  . Rectal bleeding   . Rheumatoid arthritis (HCC)   . Spondylosis   . TIA (transient ischemic attack) 2013  . Urinary incontinence    wears depends    Past Surgical History:  Procedure Laterality Date  . ABDOMINAL HYSTERECTOMY  1982  . BACK SURGERY    . BALLOON DILATION N/A 09/21/2018   Procedure: BALLOON DILATION;  Surgeon: Hilarie Fredrickson, MD;  Location: [facility] ENDOSCOPY;  Service: Endoscopy;  Laterality: N/A;  . BALLOON DILATION N/A 08/12/2019   Procedure: BALLOON DILATION;  Surgeon: Lynann Bologna, MD;  Location: [facility] ENDOSCOPY;  Service: Endoscopy;  Laterality: N/A;  . BIOPSY  08/12/2019   Procedure: BIOPSY;  Surgeon: Lynann Bologna, MD;  Location: [facility] ENDOSCOPY;  Service: Endoscopy;;  . BLADDER SUSPENSION  1980's  . BOTOX INJECTION N/A 09/21/2018   Procedure: BOTOX INJECTION;  Surgeon: Hilarie Fredrickson, MD;  Location: [facility] ENDOSCOPY;  Service: Endoscopy;  Laterality: N/A;  . BOTOX INJECTION N/A 08/12/2019   Procedure: BOTOX INJECTION;  Surgeon: Lynann Bologna, MD;  Location: [facility] ENDOSCOPY;  Service: Endoscopy;  Laterality: N/A;  . CATARACT EXTRACTION W/ INTRAOCULAR LENS  IMPLANT, BILATERAL Bilateral 2010  . DILATION AND CURETTAGE OF UTERUS  1961  . ESOPHAGEAL MANOMETRY N/A 03/25/2018   Procedure: ESOPHAGEAL MANOMETRY (EM);  Surgeon: Napoleon Form, MD;  Location: [facility] ENDOSCOPY;  Service: Endoscopy;  Laterality: N/A;  . ESOPHAGOGASTRODUODENOSCOPY (EGD) WITH PROPOFOL N/A 04/07/2018   Procedure: ESOPHAGOGASTRODUODENOSCOPY (EGD) WITH PROPOFOL;  Surgeon: Sherrilyn Rist, MD;  Location: [facility] ENDOSCOPY;  Service: Gastroenterology;  Laterality: N/A;  . ESOPHAGOGASTRODUODENOSCOPY (EGD) WITH PROPOFOL N/A 09/21/2018   Procedure: ESOPHAGOGASTRODUODENOSCOPY (EGD) WITH PROPOFOL;  Surgeon: Hilarie Fredrickson, MD;  Location: [facility] ENDOSCOPY;  Service: Endoscopy;  Laterality:  N/A;  . ESOPHAGOGASTRODUODENOSCOPY (EGD) WITH PROPOFOL N/A 08/12/2019   Procedure: ESOPHAGOGASTRODUODENOSCOPY (EGD) WITH PROPOFOL;  Surgeon: Lynann Bologna, MD;  Location: [facility] ENDOSCOPY;  Service: Endoscopy;  Laterality: N/A;  . HERNIA REPAIR    . HIATAL HERNIA REPAIR N/A 08/02/2016   Procedure: LAPAROSCOPIC REPAIR OF LARGE  HIATAL HERNIA;  Surgeon: Glenna Fellows, MD;  Location: [facility] ORS;  Service: General;  Laterality: N/A;  . JOINT REPLACEMENT    . LAPAROSCOPIC NISSEN FUNDOPLICATION N/A 08/02/2016   Procedure: LAPAROSCOPIC NISSEN FUNDOPLICATION;  Surgeon: Glenna Fellows, MD;  Location: [facility] ORS;  Service: General;  Laterality: N/A;  . LUMBAR LAMINECTOMY  1990; 1994; [id number]   "I've got 2 stainless steel rods; 6 screws; 2 ray cages"took bone from right hip to put in back  . RIGHT/LEFT HEART CATH AND CORONARY ANGIOGRAPHY N/A 07/26/2019   Procedure: RIGHT/LEFT HEART CATH AND CORONARY ANGIOGRAPHY;  Surgeon: Lyn Records, MD;  Location: [facility] INVASIVE CV LAB;  Service: Cardiovascular;  Laterality: N/A;  . TEE WITHOUT CARDIOVERSION N/A 08/19/2019   Procedure: TRANSESOPHAGEAL ECHOCARDIOGRAM (TEE);  Surgeon: Lewayne Bunting, MD;  Location: [facility] ENDOSCOPY;  Service: Cardiovascular;  Laterality: N/A;  . TONSILLECTOMY AND ADENOIDECTOMY  1945  . TOTAL KNEE ARTHROPLASTY Right ~ 1996  . TUBAL LIGATION  ~ 1976    Family History  Problem Relation Age of Onset  . Kidney disease Mother   . Hypertension Mother   . Heart disease Father 51       die of MI at age 77  . Suicidality Son   . Heart disease Brother   . Heart attack Brother   . Colon cancer Neg Hx   . Esophageal cancer Neg Hx   . Pancreatic cancer Neg Hx   . Rectal cancer Neg Hx   . Stomach cancer Neg Hx     Social History   Socioeconomic History  . Marital status: Widowed    Spouse name: Not on file  . Number of children: 2  . Years of education: Not on file  . Highest education level: Not on file  Occupational History  .  Occupation: retired    Associate Professor: RETIRED  Tobacco Use  . Smoking status: Never Smoker  . Smokeless tobacco: Never Used  Vaping Use  . Vaping Use: Never used  Substance and Sexual Activity  . Alcohol use: Never  . Drug use: Never  . Sexual activity: Not Currently    Comment: 1st intercourse 56 yo-1 partner  Other Topics Concern  . Not on file  Social History Narrative   Retired    Widowed   Social Determinants of Corporate investment banker Strain: Low Risk   . Difficulty of Paying Living Expenses: Not hard at all  Food Insecurity: No Food Insecurity  . Worried About Programme researcher, broadcasting/film/video in the Last Year: Never true  . Ran Out of Food in the Last Year: Never true  Transportation Needs: No Transportation Needs  .  Lack of Transportation (Medical): No  . Lack of Transportation (Non-Medical): No  Physical Activity: Inactive  . Days of Exercise per Week: 0 days  . Minutes of Exercise per Session: 0 min  Stress: Stress Concern Present  . Feeling of Stress : To some extent  Social Connections: Socially Isolated  . Frequency of Communication with Friends and Family: Twice a week  . Frequency of Social Gatherings with Friends and Family: Never  . Attends Religious Services: 1 to 4 times per year  . Active Member of Clubs or Organizations: No  . Attends Banker Meetings: Never  . Marital Status: Widowed  Intimate Partner Violence: Not At Risk  . Fear of Current or Ex-Partner: No  . Emotionally Abused: No  . Physically Abused: No  . Sexually Abused: No    Outpatient Medications Prior to Visit  Medication Sig Dispense Refill  . acetaminophen (TYLENOL) 500 MG tablet Take 1,000 mg by mouth every 6 (six) hours as needed for moderate pain, headache or fever.    Marland Kitchen albuterol (VENTOLIN HFA) 108 (90 Base) MCG/ACT inhaler TAKE 2 PUFFS BY MOUTH EVERY 6 HOURS AS NEEDED FOR WHEEZE OR SHORTNESS OF BREATH 4 g 2  . amiodarone (PACERONE) 200 MG tablet Take 1 tablet (200 mg total) by  mouth daily. 90 tablet 3  . apixaban (ELIQUIS) 5 MG TABS tablet Take 1 tablet (5 mg total) by mouth 2 (two) times daily. 180 tablet 1  . Carboxymethylcellulose Sodium (REFRESH TEARS OP) Place 1 drop into both eyes at bedtime.    . carvedilol (COREG) 6.25 MG tablet Take 1 tablet (6.25 mg total) by mouth 2 (two) times daily with a meal. 180 tablet 3  . clonazePAM (KLONOPIN) 0.5 MG tablet TAKE 1 TABLET BY MOUTH TWICE A DAY AS NEEDED FOR ANXIETY 60 tablet 1  . DULoxetine (CYMBALTA) 60 MG capsule TAKE 1 CAPSULE BY MOUTH EVERY DAY 90 capsule 3  . ENTRESTO 97-103 MG TAKE 1 TABLET BY MOUTH TWICE A DAY 180 tablet 3  . furosemide (LASIX) 40 MG tablet TAKE 1 TABLET (40 MG TOTAL) BY MOUTH DAILY AS NEEDED FOR FLUID OR EDEMA. 90 tablet 3  . gabapentin (NEURONTIN) 100 MG capsule TAKE 1 CAPSULE BY MOUTH THREE TIMES A DAY 270 capsule 2  . omeprazole (PRILOSEC) 20 MG capsule TAKE 1 CAPSULE BY MOUTH EVERY DAY 90 capsule 3  . spironolactone (ALDACTONE) 25 MG tablet Take 1 tablet (25 mg total) by mouth daily. 90 tablet 2   No facility-administered medications prior to visit.    Allergies  Allergen Reactions  . Morphine Other (See Comments)    Flushing, rash, itching  . Tape Other (See Comments)    Band aides, adhesive tape Redness and pulls skin off  . Tramadol Other (See Comments)    Pt has prolonged Qtc interval of 540- cannot give tramadol per pharmacy    ROS Review of Systems  Constitutional: Negative for appetite change, chills and fever.  Respiratory: Positive for shortness of breath. Negative for cough and wheezing.   Cardiovascular: Negative for chest pain and leg swelling.  Gastrointestinal: Negative for abdominal pain.  Neurological: Negative for dizziness.      Objective:    Physical Exam Vitals reviewed.  Constitutional:      Appearance: She is well-developed.  Cardiovascular:     Rate and Rhythm: Normal rate and regular rhythm.  Pulmonary:     Effort: Pulmonary effort is normal.      Breath sounds: Normal breath  sounds.     Comments: She has some mild tenderness left lateral rib cage area around the eighth and ninth rib level. Musculoskeletal:     Right lower leg: No edema.     Left lower leg: No edema.     Comments: No calf tenderness  Neurological:     Mental Status: She is alert.     BP 122/60 (BP Location: Left Arm, Patient Position: Sitting, Cuff Size: Normal)   Pulse 78   Temp 97.8 F (36.6 C) (Oral)   Wt 155 lb 11.2 oz (70.6 kg)   SpO2 98%   BMI 29.42 kg/m  Wt Readings from Last 3 Encounters:  02/12/21 155 lb 11.2 oz (70.6 kg)  12/29/20 158 lb (71.7 kg)  12/19/20 158 lb 6.4 oz (71.8 kg)     Health Maintenance Due  Topic Date Due  . COVID-19 Vaccine (1) Never done  . TETANUS/TDAP  04/08/2016    There are no preventive care reminders to display for this patient.  Lab Results  Component Value Date   TSH 2.023 10/15/2020   Lab Results  Component Value Date   WBC 8.7 10/15/2020   HGB 11.6 (L) 10/15/2020   HCT 37.3 10/15/2020   MCV 101.6 (H) 10/15/2020   PLT 237 10/15/2020   Lab Results  Component Value Date   NA 141 10/15/2020   K 4.9 10/15/2020   CO2 24 10/15/2020   GLUCOSE 87 10/15/2020   BUN 21 10/15/2020   CREATININE 0.89 10/15/2020   BILITOT 0.4 03/09/2020   ALKPHOS 67 03/09/2020   AST 19 03/09/2020   ALT 14 03/09/2020   PROT 6.6 03/09/2020   ALBUMIN 4.1 03/09/2020   CALCIUM 8.2 (L) 10/15/2020   ANIONGAP 8 10/15/2020   GFR 52.45 (L) 03/09/2020   Lab Results  Component Value Date   CHOL 172 03/09/2020   Lab Results  Component Value Date   HDL 65.60 03/09/2020   Lab Results  Component Value Date   LDLCALC 84 03/09/2020   Lab Results  Component Value Date   TRIG 111.0 03/09/2020   Lab Results  Component Value Date   CHOLHDL 3 03/09/2020   No results found for: HGBA1C    Assessment & Plan:   #1 patient presents with 2-day history of left rib cage pain and some pain with deep breathing.  She complains  of dyspnea which sounds to be more chronic but worse past couple days.  She has no respiratory distress at rest.  Pulse oximetry 98%.  She is on Eliquis regularly and does have reproducible pain on palpation which makes etiology such as DVT/PE unlikely.  Denies injury.  -Obtain chest x-ray -Labs with CBC, basic metabolic panel, BNP level -Consider over-the-counter lidocaine patch  #2 intermittent leg cramps -Check basic metabolic panel  No orders of the defined types were placed in this encounter.   Follow-up: No follow-ups on file.    Evelena Peat, MD

## 2021-02-13 LAB — BRAIN NATRIURETIC PEPTIDE: Brain Natriuretic Peptide: 89 pg/mL (ref <100)

## 2021-02-13 LAB — BASIC METABOLIC PANEL
BUN: 27 mg/dL — ABNORMAL HIGH (ref 6–23)
CO2: 26 mEq/L (ref 19–32)
Calcium: 9.3 mg/dL (ref 8.4–10.5)
Chloride: 100 mEq/L (ref 96–112)
Creatinine, Ser: 1.18 mg/dL (ref 0.40–1.20)
GFR: 42.83 mL/min — ABNORMAL LOW (ref >60.00)
Glucose, Bld: 95 mg/dL (ref 70–99)
Potassium: 5 mEq/L (ref 3.5–5.1)
Sodium: 136 mEq/L (ref 135–145)

## 2021-02-13 LAB — CBC WITH DIFFERENTIAL/PLATELET
Basophils Absolute: 0.1 10*3/uL (ref 0.0–0.1)
Basophils Relative: 1.1 % (ref 0.0–3.0)
Eosinophils Absolute: 0.2 10*3/uL (ref 0.0–0.7)
Eosinophils Relative: 1.7 % (ref 0.0–5.0)
HCT: 37.3 % (ref 36.0–46.0)
Hemoglobin: 12.3 g/dL (ref 12.0–15.0)
Lymphocytes Relative: 17.9 % (ref 12.0–46.0)
Lymphs Abs: 1.7 10*3/uL (ref 0.7–4.0)
MCHC: 33.1 g/dL (ref 30.0–36.0)
MCV: 96 fl (ref 78.0–100.0)
Monocytes Absolute: 0.6 10*3/uL (ref 0.1–1.0)
Monocytes Relative: 6 % (ref 3.0–12.0)
Neutro Abs: 7 10*3/uL (ref 1.4–7.7)
Neutrophils Relative %: 73.3 % (ref 43.0–77.0)
Platelets: 304 10*3/uL (ref 150.0–400.0)
RBC: 3.89 Mil/uL (ref 3.87–5.11)
RDW: 13.7 % (ref 11.5–15.5)
WBC: 9.5 10*3/uL (ref 4.0–10.5)

## 2021-02-14 ENCOUNTER — Ambulatory Visit (INDEPENDENT_AMBULATORY_CARE_PROVIDER_SITE_OTHER): Payer: Medicare Other

## 2021-02-14 ENCOUNTER — Other Ambulatory Visit: Payer: Medicare Other

## 2021-02-14 ENCOUNTER — Other Ambulatory Visit: Payer: Self-pay

## 2021-02-14 DIAGNOSIS — R0600 Dyspnea, unspecified: Secondary | ICD-10-CM | POA: Diagnosis not present

## 2021-02-14 DIAGNOSIS — R911 Solitary pulmonary nodule: Secondary | ICD-10-CM

## 2021-02-14 DIAGNOSIS — R079 Chest pain, unspecified: Secondary | ICD-10-CM | POA: Diagnosis not present

## 2021-02-20 NOTE — Progress Notes (Unsigned)
Patient notified of chest x-ray results.  8 mm right lower lobe lung nodule.  Recommendation was for nonemergent CT to follow-up.  This will be ordered.  She had a high resolution CT chest 7/20 with recommended 40-month follow-up

## 2021-02-23 DIAGNOSIS — J449 Chronic obstructive pulmonary disease, unspecified: Secondary | ICD-10-CM | POA: Diagnosis not present

## 2021-02-23 DIAGNOSIS — J9611 Chronic respiratory failure with hypoxia: Secondary | ICD-10-CM | POA: Diagnosis not present

## 2021-02-28 ENCOUNTER — Encounter: Payer: Medicare Other | Admitting: Physical Medicine and Rehabilitation

## 2021-03-16 DIAGNOSIS — M25562 Pain in left knee: Secondary | ICD-10-CM | POA: Diagnosis not present

## 2021-03-16 DIAGNOSIS — M1712 Unilateral primary osteoarthritis, left knee: Secondary | ICD-10-CM | POA: Diagnosis not present

## 2021-03-19 ENCOUNTER — Other Ambulatory Visit: Payer: Self-pay | Admitting: Adult Health

## 2021-03-19 DIAGNOSIS — Z760 Encounter for issue of repeat prescription: Secondary | ICD-10-CM

## 2021-03-22 DIAGNOSIS — M1712 Unilateral primary osteoarthritis, left knee: Secondary | ICD-10-CM | POA: Diagnosis not present

## 2021-03-22 DIAGNOSIS — M25562 Pain in left knee: Secondary | ICD-10-CM | POA: Diagnosis not present

## 2021-03-25 DIAGNOSIS — J449 Chronic obstructive pulmonary disease, unspecified: Secondary | ICD-10-CM | POA: Diagnosis not present

## 2021-03-25 DIAGNOSIS — J9611 Chronic respiratory failure with hypoxia: Secondary | ICD-10-CM | POA: Diagnosis not present

## 2021-03-29 DIAGNOSIS — M1712 Unilateral primary osteoarthritis, left knee: Secondary | ICD-10-CM | POA: Diagnosis not present

## 2021-04-03 ENCOUNTER — Inpatient Hospital Stay (HOSPITAL_COMMUNITY)
Admission: EM | Admit: 2021-04-03 | Discharge: 2021-04-05 | DRG: 309 | Disposition: A | Discharge status: Home / Self Care | Payer: Medicare Other | Attending: Internal Medicine | Admitting: Internal Medicine

## 2021-04-03 ENCOUNTER — Other Ambulatory Visit: Payer: Self-pay

## 2021-04-03 ENCOUNTER — Ambulatory Visit (INDEPENDENT_AMBULATORY_CARE_PROVIDER_SITE_OTHER): Payer: Medicare Other | Admitting: Adult Health

## 2021-04-03 ENCOUNTER — Encounter: Payer: Self-pay | Admitting: Adult Health

## 2021-04-03 ENCOUNTER — Telehealth: Payer: Self-pay | Admitting: Cardiovascular Disease

## 2021-04-03 ENCOUNTER — Emergency Department (HOSPITAL_COMMUNITY): Payer: Medicare Other

## 2021-04-03 VITALS — BP 110/60 | HR 75 | Temp 98.2°F | Ht 61.0 in | Wt 160.0 lb

## 2021-04-03 DIAGNOSIS — R0600 Dyspnea, unspecified: Secondary | ICD-10-CM

## 2021-04-03 DIAGNOSIS — R000 Tachycardia, unspecified: Secondary | ICD-10-CM

## 2021-04-03 DIAGNOSIS — I471 Supraventricular tachycardia: Secondary | ICD-10-CM | POA: Diagnosis not present

## 2021-04-03 DIAGNOSIS — N179 Acute kidney failure, unspecified: Secondary | ICD-10-CM | POA: Diagnosis not present

## 2021-04-03 DIAGNOSIS — I429 Cardiomyopathy, unspecified: Secondary | ICD-10-CM | POA: Diagnosis present

## 2021-04-03 DIAGNOSIS — Z888 Allergy status to other drugs, medicaments and biological substances status: Secondary | ICD-10-CM

## 2021-04-03 DIAGNOSIS — I248 Other forms of acute ischemic heart disease: Secondary | ICD-10-CM | POA: Diagnosis not present

## 2021-04-03 DIAGNOSIS — Z20822 Contact with and (suspected) exposure to covid-19: Secondary | ICD-10-CM | POA: Diagnosis not present

## 2021-04-03 DIAGNOSIS — J449 Chronic obstructive pulmonary disease, unspecified: Secondary | ICD-10-CM | POA: Diagnosis present

## 2021-04-03 DIAGNOSIS — R079 Chest pain, unspecified: Secondary | ICD-10-CM | POA: Diagnosis not present

## 2021-04-03 DIAGNOSIS — I110 Hypertensive heart disease with heart failure: Secondary | ICD-10-CM | POA: Diagnosis present

## 2021-04-03 DIAGNOSIS — I2510 Atherosclerotic heart disease of native coronary artery without angina pectoris: Secondary | ICD-10-CM | POA: Diagnosis present

## 2021-04-03 DIAGNOSIS — Z7901 Long term (current) use of anticoagulants: Secondary | ICD-10-CM

## 2021-04-03 DIAGNOSIS — Z885 Allergy status to narcotic agent status: Secondary | ICD-10-CM | POA: Diagnosis not present

## 2021-04-03 DIAGNOSIS — G6289 Other specified polyneuropathies: Secondary | ICD-10-CM

## 2021-04-03 DIAGNOSIS — K219 Gastro-esophageal reflux disease without esophagitis: Secondary | ICD-10-CM | POA: Diagnosis present

## 2021-04-03 DIAGNOSIS — Z8673 Personal history of transient ischemic attack (TIA), and cerebral infarction without residual deficits: Secondary | ICD-10-CM | POA: Diagnosis not present

## 2021-04-03 DIAGNOSIS — E7800 Pure hypercholesterolemia, unspecified: Secondary | ICD-10-CM | POA: Diagnosis present

## 2021-04-03 DIAGNOSIS — Z8249 Family history of ischemic heart disease and other diseases of the circulatory system: Secondary | ICD-10-CM | POA: Diagnosis not present

## 2021-04-03 DIAGNOSIS — I480 Paroxysmal atrial fibrillation: Secondary | ICD-10-CM | POA: Diagnosis not present

## 2021-04-03 DIAGNOSIS — I5022 Chronic systolic (congestive) heart failure: Secondary | ICD-10-CM | POA: Diagnosis not present

## 2021-04-03 DIAGNOSIS — Z841 Family history of disorders of kidney and ureter: Secondary | ICD-10-CM

## 2021-04-03 DIAGNOSIS — F319 Bipolar disorder, unspecified: Secondary | ICD-10-CM | POA: Diagnosis not present

## 2021-04-03 DIAGNOSIS — I472 Ventricular tachycardia: Secondary | ICD-10-CM

## 2021-04-03 DIAGNOSIS — Z96651 Presence of right artificial knee joint: Secondary | ICD-10-CM | POA: Diagnosis not present

## 2021-04-03 DIAGNOSIS — R0789 Other chest pain: Secondary | ICD-10-CM | POA: Diagnosis not present

## 2021-04-03 DIAGNOSIS — R0902 Hypoxemia: Secondary | ICD-10-CM | POA: Diagnosis not present

## 2021-04-03 DIAGNOSIS — R0602 Shortness of breath: Secondary | ICD-10-CM | POA: Diagnosis not present

## 2021-04-03 DIAGNOSIS — Z79899 Other long term (current) drug therapy: Secondary | ICD-10-CM | POA: Diagnosis not present

## 2021-04-03 LAB — CBC
HCT: 36.2 % (ref 36.0–46.0)
Hemoglobin: 11.6 g/dL — ABNORMAL LOW (ref 12.0–15.0)
MCH: 31.4 pg (ref 26.0–34.0)
MCHC: 32 g/dL (ref 30.0–36.0)
MCV: 98.1 fL (ref 80.0–100.0)
Platelets: 297 10*3/uL (ref 150–400)
RBC: 3.69 MIL/uL — ABNORMAL LOW (ref 3.87–5.11)
RDW: 13.1 % (ref 11.5–15.5)
WBC: 9.6 10*3/uL (ref 4.0–10.5)
nRBC: 0 % (ref 0.0–0.2)

## 2021-04-03 LAB — RESP PANEL BY RT-PCR (FLU A&B, COVID) ARPGX2
Influenza A by PCR: NEGATIVE
Influenza B by PCR: NEGATIVE
SARS Coronavirus 2 by RT PCR: NEGATIVE

## 2021-04-03 LAB — TROPONIN I (HIGH SENSITIVITY)
Troponin I (High Sensitivity): 7 ng/L (ref <18)
Troponin I (High Sensitivity): 18 ng/L — ABNORMAL HIGH (ref <18)

## 2021-04-03 LAB — BASIC METABOLIC PANEL
Anion gap: 11 (ref 5–15)
BUN: 25 mg/dL — ABNORMAL HIGH (ref 8–23)
CO2: 22 mmol/L (ref 22–32)
Calcium: 8.8 mg/dL — ABNORMAL LOW (ref 8.9–10.3)
Chloride: 100 mmol/L (ref 98–111)
Creatinine, Ser: 1.54 mg/dL — ABNORMAL HIGH (ref 0.44–1.00)
GFR, Estimated: 33 mL/min — ABNORMAL LOW (ref >60)
Glucose, Bld: 132 mg/dL — ABNORMAL HIGH (ref 70–99)
Potassium: 4.7 mmol/L (ref 3.5–5.1)
Sodium: 133 mmol/L — ABNORMAL LOW (ref 135–145)

## 2021-04-03 LAB — CBG MONITORING, ED: Glucose-Capillary: 128 mg/dL — ABNORMAL HIGH (ref 70–99)

## 2021-04-03 MED ORDER — GABAPENTIN 300 MG PO CAPS: 300.0000 mg | ORAL_CAPSULE | Freq: Three times a day (TID) | ORAL | 1 refills | Status: DC

## 2021-04-03 MED ORDER — AMIODARONE HCL IN DEXTROSE 360-4.14 MG/200ML-% IV SOLN
30.0000 mg/h | INTRAVENOUS | Status: DC
  Administered 2021-04-03 – 2021-04-04 (×2): 30 mg/h via INTRAVENOUS
  Filled 2021-04-03: qty 200

## 2021-04-03 MED ORDER — ASPIRIN 81 MG PO CHEW: 324.0000 mg | CHEWABLE_TABLET | Freq: Once | ORAL | Status: DC

## 2021-04-03 MED ORDER — SODIUM CHLORIDE 0.9 % IV SOLN
INTRAVENOUS | Status: AC | PRN
  Administered 2021-04-03: 999 mL/h via INTRAVENOUS

## 2021-04-03 MED ORDER — PROPOFOL 10 MG/ML IV BOLUS
INTRAVENOUS | Status: AC
  Administered 2021-04-03: 40 mg via INTRAVENOUS
  Filled 2021-04-03: qty 20

## 2021-04-03 MED ORDER — LACTATED RINGERS IV BOLUS
1000.0000 mL | Freq: Once | INTRAVENOUS | Status: AC
  Administered 2021-04-03: 1000 mL via INTRAVENOUS

## 2021-04-03 MED ORDER — PROPOFOL 1000 MG/100ML IV EMUL: INTRAVENOUS | Status: AC | PRN

## 2021-04-03 MED ORDER — ASPIRIN 81 MG PO CHEW: 324.0000 mg | CHEWABLE_TABLET | Freq: Once | ORAL | Status: AC

## 2021-04-03 MED ORDER — AMIODARONE HCL IN DEXTROSE 360-4.14 MG/200ML-% IV SOLN
60.0000 mg/h | INTRAVENOUS | Status: DC
  Administered 2021-04-03: 60 mg/h via INTRAVENOUS
  Filled 2021-04-03: qty 200

## 2021-04-03 MED ORDER — ALBUTEROL SULFATE HFA 108 (90 BASE) MCG/ACT IN AERS: INHALATION_SPRAY | RESPIRATORY_TRACT | 2 refills | Status: DC

## 2021-04-03 NOTE — ED Triage Notes (Signed)
Pt arrived via [facility]. EMS reporting pt c/o CP x1 hour while sitting at rest at home. EMS reports giving 0.4 nitro sl, aspirin 324 mg PO, and cardizem 10 mg IV. EMS reports initial HR in 170-180's which decreased to 150's after med admin. On arrival to ED pt alert, very anxious, c/o increasing pain in chest, jaw, and left arm. EMS reports pt has cardiac hx afib and having to be cardioverted. Pt takes eliquis. Home O2 at 2.5 lpm for COPD/CHF.

## 2021-04-03 NOTE — Telephone Encounter (Signed)
Patient called to say that when she went to go pick up ENTRESTO 97-103 MG the cost of it was $1064. Patient states she cant no afford that and she had a discount card so she doesn't understand why it is that much. Please advise

## 2021-04-03 NOTE — H&P (Signed)
Cardiology Consultation:   Patient ID: Julie Rice MRN: [id number]; DOB: November 05, 1935  Admit date: 04/03/2021 Date of Consult: 04/03/2021  PCP:  Shirline Frees, NP   [facility] Providers Cardiologist:  Charlton Haws, MD   {  Patient Profile:   Julie Rice is a 85 y.o. female with a hx of  non obstructive coronary disease, chronic systolic CHF with improved LVEF, fibromyalgia, hypertension, paroxysmal atrial fibrillation chronically anticoagulated on apixaban, TIA, COPD on oxygen, esophageal stricture status post balloon dilatation, large symptomatic hiatal hernia status post fundoplication, chronic dysphagia  who is being seen 04/03/2021 for the evaluation of afib and chest pain  at the request of Dr. Lynelle Doctor.   Right and left heart cath on 07/26/2019 showed only mild nonobstructive disease and low right heart pressures.  Hx of PAF with prior cardioversion.  Last admitted with afib on 10/2020. Had hypotension post cardioversion requiring admission.   Last seen by Dr. Eden Emms 11/2020.  History of Present Illness:   Julie Rice had sudden onset chest pain with palpitation this afternoon.  Noted radiation of pain to her jaw and left arm.  She was diaphoretic and lightheaded.  Symptoms lasted for approximately 1 hour and EMS was called.  She was found tachycardic at heart rate of 170-180s.  Given sublingual nitroglycerin x1, aspirin 324 mg and IV Cardizem 10 mg in route.  Upon arrival to emergency room her heart rate was decreased to 150s.  Patient was hypotensive with SBP in 80s.   Rhythm showing wide complex tachycardia.  Unsuccessful cardioversion 1807 at 120 J  and then cardioverted 1808 at 200 J.  Patient felt better after cardioversion however noted tingling in her hand.  Had recurrent tachycardia at 1920>> successful cardioversion at 200 J.  She is started on fluids and amiodarone load.  Blood pressure improved during my evaluation at 99/89.  Chest pain almost resolved.  Feeling  better.  Mentating well.  High-sensitivity troponin 7 Creatinine 1.54 Potassium 4.7 Hemoglobin 11.6   Past Medical History:  Diagnosis Date  . A-fib (HCC)   . Anemia   . Anxiety   . Bipolar disorder (HCC)   . Blood transfusion 1991   autologous pts own blood given   . CAD (coronary artery disease)   . Cataract   . Chest pain    "@ rest, lying down, w/exertion"  . CHF (congestive heart failure) (HCC) 07/2020  . Chronic back pain    "mostly lower back but I do have upper back pain regularly" (04/06/2018)  . COPD (chronic obstructive pulmonary disease) (HCC)   . Depression   . Esophageal stricture   . Fibromyalgia   . GERD (gastroesophageal reflux disease)   . Heart murmur    "slight" (04/06/2018)  . Hiatal hernia   . Hyperlipidemia    Patient denies  . Hypertension   . Internal hemorrhoids   . Lumbago   . Mitral regurgitation   . Osteoarthritis   . Osteoporosis   . Pneumonia ~ 01/2018  . PONV (postoperative nausea and vomiting)    severe ponv, "in the past" (04/06/2018)  . Rectal bleeding   . Rheumatoid arthritis (HCC)   . Spondylosis   . TIA (transient ischemic attack) 2013  . Urinary incontinence    wears depends    Past Surgical History:  Procedure Laterality Date  . ABDOMINAL HYSTERECTOMY  1982  . BACK SURGERY    . BALLOON DILATION N/A 09/21/2018   Procedure: BALLOON DILATION;  Surgeon: Hilarie Fredrickson, MD;  Location: [facility] ENDOSCOPY;  Service: Endoscopy;  Laterality: N/A;  . BALLOON DILATION N/A 08/12/2019   Procedure: BALLOON DILATION;  Surgeon: Lynann Bologna, MD;  Location[facility] ENDOSCOPY;  Service: Endoscopy;  Laterality: N/A;  . BIOPSY  08/12/2019   Procedure: BIOPSY;  Surgeon: Lynann Bologna, MD;  Location: [facility] ENDOSCOPY;  Service: Endoscopy;;  . BLADDER SUSPENSION  1980's  . BOTOX INJECTION N/A 09/21/2018   Procedure: BOTOX INJECTION;  Surgeon: Hilarie Fredrickson, MD;  Location: [facility] ENDOSCOPY;  Service: Endoscopy;  Laterality: N/A;  . BOTOX INJECTION N/A 08/12/2019    Procedure: BOTOX INJECTION;  Surgeon: Lynann Bologna, MD;  Location: [facility] ENDOSCOPY;  Service: Endoscopy;  Laterality: N/A;  . CATARACT EXTRACTION W/ INTRAOCULAR LENS  IMPLANT, BILATERAL Bilateral 2010  . DILATION AND CURETTAGE OF UTERUS  1961  . ESOPHAGEAL MANOMETRY N/A 03/25/2018   Procedure: ESOPHAGEAL MANOMETRY (EM);  Surgeon: Napoleon Form, MD;  Location: [facility] ENDOSCOPY;  Service: Endoscopy;  Laterality: N/A;  . ESOPHAGOGASTRODUODENOSCOPY (EGD) WITH PROPOFOL N/A 04/07/2018   Procedure: ESOPHAGOGASTRODUODENOSCOPY (EGD) WITH PROPOFOL;  Surgeon: Sherrilyn Rist, MD;  Location: [facility] ENDOSCOPY;  Service: Gastroenterology;  Laterality: N/A;  . ESOPHAGOGASTRODUODENOSCOPY (EGD) WITH PROPOFOL N/A 09/21/2018   Procedure: ESOPHAGOGASTRODUODENOSCOPY (EGD) WITH PROPOFOL;  Surgeon: Hilarie Fredrickson, MD;  Location: [facility] ENDOSCOPY;  Service: Endoscopy;  Laterality: N/A;  . ESOPHAGOGASTRODUODENOSCOPY (EGD) WITH PROPOFOL N/A 08/12/2019   Procedure: ESOPHAGOGASTRODUODENOSCOPY (EGD) WITH PROPOFOL;  Surgeon: Lynann Bologna, MD;  Location: [facility] ENDOSCOPY;  Service: Endoscopy;  Laterality: N/A;  . HERNIA REPAIR    . HIATAL HERNIA REPAIR N/A 08/02/2016   Procedure: LAPAROSCOPIC REPAIR OF LARGE  HIATAL HERNIA;  Surgeon: Glenna Fellows, MD;  Location: [facility] ORS;  Service: General;  Laterality: N/A;  . JOINT REPLACEMENT    . LAPAROSCOPIC NISSEN FUNDOPLICATION N/A 08/02/2016   Procedure: LAPAROSCOPIC NISSEN FUNDOPLICATION;  Surgeon: Glenna Fellows, MD;  Location: [facility] ORS;  Service: General;  Laterality: N/A;  . LUMBAR LAMINECTOMY  1990; 1994; [id number]   "I've got 2 stainless steel rods; 6 screws; 2 ray cages"took bone from right hip to put in back  . RIGHT/LEFT HEART CATH AND CORONARY ANGIOGRAPHY N/A 07/26/2019   Procedure: RIGHT/LEFT HEART CATH AND CORONARY ANGIOGRAPHY;  Surgeon: Lyn Records, MD;  Location: [facility] INVASIVE CV LAB;  Service: Cardiovascular;  Laterality: N/A;  . TEE WITHOUT CARDIOVERSION N/A 08/19/2019    Procedure: TRANSESOPHAGEAL ECHOCARDIOGRAM (TEE);  Surgeon: Lewayne Bunting, MD;  Location: [facility] ENDOSCOPY;  Service: Cardiovascular;  Laterality: N/A;  . TONSILLECTOMY AND ADENOIDECTOMY  1945  . TOTAL KNEE ARTHROPLASTY Right ~ 1996  . TUBAL LIGATION  ~ 1976     Inpatient Medications: Scheduled Meds: . aspirin  324 mg Oral Once   Continuous Infusions: . amiodarone Stopped (04/03/21 1852)  . [START ON 04/04/2021] amiodarone 30 mg/hr (04/03/21 1925)   PRN Meds:   Allergies:    Allergies  Allergen Reactions  . Morphine Other (See Comments)    Flushing, rash, itching  . Tape Other (See Comments)    Band aides, adhesive tape Redness and pulls skin off  . Tramadol Other (See Comments)    Pt has prolonged Qtc interval of 540- cannot give tramadol per pharmacy    Social History:   Social History   Socioeconomic History  . Marital status: Widowed    Spouse name: Not on file  . Number of children: 2  . Years of education: Not on file  . Highest education level: Not on file  Occupational History  .  Occupation: retired    Associate Professor: RETIRED  Tobacco Use  . Smoking status: Never Smoker  . Smokeless tobacco: Never Used  Vaping Use  . Vaping Use: Never used  Substance and Sexual Activity  . Alcohol use: Never  . Drug use: Never  . Sexual activity: Not Currently    Comment: 1st intercourse 52 yo-1 partner  Other Topics Concern  . Not on file  Social History Narrative   Retired    Widowed   Social Determinants of Corporate investment banker Strain: Low Risk   . Difficulty of Paying Living Expenses: Not hard at all  Food Insecurity: No Food Insecurity  . Worried About Programme researcher, broadcasting/film/video in the Last Year: Never true  . Ran Out of Food in the Last Year: Never true  Transportation Needs: No Transportation Needs  . Lack of Transportation (Medical): No  . Lack of Transportation (Non-Medical): No  Physical Activity: Inactive  . Days of Exercise per Week: 0 days  . Minutes of  Exercise per Session: 0 min  Stress: Stress Concern Present  . Feeling of Stress : To some extent  Social Connections: Socially Isolated  . Frequency of Communication with Friends and Family: Twice a week  . Frequency of Social Gatherings with Friends and Family: Never  . Attends Religious Services: 1 to 4 times per year  . Active Member of Clubs or Organizations: No  . Attends Banker Meetings: Never  . Marital Status: Widowed  Intimate Partner Violence: Not At Risk  . Fear of Current or Ex-Partner: No  . Emotionally Abused: No  . Physically Abused: No  . Sexually Abused: No    Family History:   Family History  Problem Relation Age of Onset  . Kidney disease Mother   . Hypertension Mother   . Heart disease Father 22       die of MI at age 70  . Suicidality Son   . Heart disease Brother   . Heart attack Brother   . Colon cancer Neg Hx   . Esophageal cancer Neg Hx   . Pancreatic cancer Neg Hx   . Rectal cancer Neg Hx   . Stomach cancer Neg Hx      ROS:  Please see the history of present illness.  All other ROS reviewed and negative.     Physical Exam/Data:   Vitals:   04/03/21 1844 04/03/21 1845 04/03/21 1900 04/03/21 1930  BP: (!) 79/42 (!) 85/41 (!) 95/43 99/89  Pulse: 84 80 86 86  Resp: 18 12 14 13   Temp:      TempSrc:      SpO2: 100% 100% 100% 100%    Intake/Output Summary (Last 24 hours) at 04/03/2021 1957 Last data filed at 04/03/2021 1758 Gross per 24 hour  Intake 250 ml  Output --  Net 250 ml   Last 3 Weights 04/03/2021 02/12/2021 12/29/2020  Weight (lbs) 160 lb 155 lb 11.2 oz 158 lb  Weight (kg) 72.576 kg 70.625 kg 71.668 kg     There is no height or weight on file to calculate BMI.  General:  Well nourished, well developed, in no acute distress HEENT: normal Lymph: no adenopathy Neck: no JVD Endocrine:  No thryomegaly Vascular: No carotid bruits; FA pulses 2+ bilaterally without bruits  Cardiac:  normal S1, S2; RRR; no murmur   Lungs:  clear to auscultation bilaterally, no wheezing, rhonchi or rales  Abd: soft, nontender, no hepatomegaly  Ext: no edema  Musculoskeletal:  No deformities, BUE and BLE strength normal and equal Skin: warm and dry  Neuro:  CNs 2-12 intact, no focal abnormalities noted Psych:  Normal affect   EKG:  The EKG was personally reviewed and demonstrates: Wide-complex tachycardia at rate of 153 bpm, right bundle branch block Telemetry:  Telemetry was personally reviewed and demonstrates:  N/A  Relevant CV Studies Echo 01/2020 1. Left ventricular ejection fraction, by estimation, is 50 to 55%. The  left ventricle has low normal function. The left ventricle has no regional  wall motion abnormalities. Left ventricular diastolic parameters are  consistent with Grade I diastolic  dysfunction (impaired relaxation).  2. Right ventricular systolic function is normal. The right ventricular  size is normal. There is mildly elevated pulmonary artery systolic  pressure.  3. Left atrial size was mildly dilated.  4. The mitral valve is normal in structure. Mild mitral valve  regurgitation. No evidence of mitral stenosis.  5. The aortic valve is grossly normal. Aortic valve regurgitation is  mild. No aortic stenosis is present.  Cath 07/2019 RIGHT/LEFT HEART CATH AND CORONARY ANGIOGRAPHY    Conclusion   Right dominant coronary anatomy  Widely patent left main  30 to 40% mid LAD  Widely patent circumflex  Luminal irregularities in a dominant right coronary.  Right heart pressures are relatively low.  Pulmonary wedge pressure mean is 2 mmHg.  LVEDP 4 mmHg.  Left ventriculography by hand-injection was not helpful.  From images obtained, LV function appears relatively normal.  RECOMMENDATIONS:   Per treating team.  Relatively volume contracted and therefore we will give normal saline 100 cc/h for the next 4 hours.   Laboratory Data:   Hematology Recent Labs  Lab  04/03/21 1840  WBC 9.6  RBC 3.69*  HGB 11.6*  HCT 36.2  MCV 98.1  MCH 31.4  MCHC 32.0  RDW 13.1  PLT 297    Radiology/Studies:  DG Chest Portable 1 View  Result Date: 04/03/2021 CLINICAL DATA:  Chest pain EXAM: PORTABLE CHEST 1 VIEW COMPARISON:  02/14/2021 FINDINGS: Mild hyperinflation. Heart is normal size. No confluent opacities or effusions. Aortic atherosclerosis. IMPRESSION: Hyperinflation.  No active disease. Electronically Signed   By: Charlett Nose M.D.   On: 04/03/2021 18:33     Assessment and Plan:   1. Tachycardia  -s/p successful cardioversion at 200 J x 2.  Strip placed at [employer] for possible EP review in AM.  Continue IV amiodarone.  Continue anticoagulation.  2.  Paroxysmal atrial fibrillation -Prior history of cardioversion leading to hypotension and admission. -Continue anticoagulation with Eliquis  3.  Chest pain -Likely tachycardia mediated -Troponin negative.  Will cycle. -Patient had nonobstructive CAD by cardiac catheterization September 2020  4.  Chronic systolic heart failure -Last echocardiogram March 2021 showed improved LV function to 50 to 55% and grade 1 diastolic dysfunction -Appears euvolemic on exam -Hold Entresto, spironolactone, carvedilol and Lasix given hypotension -Slowly at home medication>> May need to change Coreg to Toprol vs discontinuation amlodipine   Risk Assessment/Risk Scores:   HEAR Score (for undifferentiated chest pain):  HEAR Score: 6    CHADSVASC score of 8 This indicates a 10.8% annual risk of stroke. The patient's score is based upon: CHF History: Yes HTN History: Yes Diabetes History: No Stroke History: Yes Vascular Disease History: Yes Age Score: 2 Gender Score: 1   For questions or updates, please contact [facility] Please consult [email or web address] for contact info under    Signed, Manson Passey, PA  04/03/2021 7:57 PM

## 2021-04-03 NOTE — ED Notes (Signed)
Attempted to call report, advised will call back.

## 2021-04-03 NOTE — ED Notes (Signed)
Synchronized cardioversion at 200J.

## 2021-04-03 NOTE — Progress Notes (Signed)
Subjective:    Patient ID: Julie Rice, female    DOB: 12-13-1935, 85 y.o.   MRN: [id number]  HPI 85 year old female who  has a past medical history of A-fib (HCC), Anemia, Anxiety, Bipolar disorder (HCC), Blood transfusion (1991), CAD (coronary artery disease), Cataract, Chest pain, CHF (congestive heart failure) (HCC) (07/2020), Chronic back pain, COPD (chronic obstructive pulmonary disease) (HCC), Depression, Esophageal stricture, Fibromyalgia, GERD (gastroesophageal reflux disease), Heart murmur, Hiatal hernia, Hyperlipidemia, Hypertension, Internal hemorrhoids, Lumbago, Mitral regurgitation, Osteoarthritis, Osteoporosis, Pneumonia (~ 01/2018), PONV (postoperative nausea and vomiting), Rectal bleeding, Rheumatoid arthritis (HCC), Spondylosis, TIA (transient ischemic attack) (2013), and Urinary incontinence.  She presents to the office today for ongoing lower extremity discomfort that is described as "burning and tingling throughout my legs".  Her discomfort seems to be worse in the evening when she is going to bed.  She was last seen for this in February 2022 at which time she was advised to increase her gabapentin from 100 mg 3 times daily to 300 mg 3 times daily.  She did not do this and she continued on the 100 mg dose.  She is also need of rescue inhaler refill.  She does have an appointment with her pulmonologist next week, but does not feel as though she can wait that long.  Review of Systems See HPI   Past Medical History:  Diagnosis Date  . A-fib (HCC)   . Anemia   . Anxiety   . Bipolar disorder (HCC)   . Blood transfusion 1991   autologous pts own blood given   . CAD (coronary artery disease)   . Cataract   . Chest pain    "@ rest, lying down, w/exertion"  . CHF (congestive heart failure) (HCC) 07/2020  . Chronic back pain    "mostly lower back but I do have upper back pain regularly" (04/06/2018)  . COPD (chronic obstructive pulmonary disease) (HCC)   . Depression   .  Esophageal stricture   . Fibromyalgia   . GERD (gastroesophageal reflux disease)   . Heart murmur    "slight" (04/06/2018)  . Hiatal hernia   . Hyperlipidemia    Patient denies  . Hypertension   . Internal hemorrhoids   . Lumbago   . Mitral regurgitation   . Osteoarthritis   . Osteoporosis   . Pneumonia ~ 01/2018  . PONV (postoperative nausea and vomiting)    severe ponv, "in the past" (04/06/2018)  . Rectal bleeding   . Rheumatoid arthritis (HCC)   . Spondylosis   . TIA (transient ischemic attack) 2013  . Urinary incontinence    wears depends    Social History   Socioeconomic History  . Marital status: Widowed    Spouse name: Not on file  . Number of children: 2  . Years of education: Not on file  . Highest education level: Not on file  Occupational History  . Occupation: retired    Associate Professor: RETIRED  Tobacco Use  . Smoking status: Never Smoker  . Smokeless tobacco: Never Used  Vaping Use  . Vaping Use: Never used  Substance and Sexual Activity  . Alcohol use: Never  . Drug use: Never  . Sexual activity: Not Currently    Comment: 1st intercourse 20 yo-1 partner  Other Topics Concern  . Not on file  Social History Narrative   Retired    Widowed   Social Determinants of Corporate investment banker Strain: Low Risk   . Difficulty  of Paying Living Expenses: Not hard at all  Food Insecurity: No Food Insecurity  . Worried About Programme researcher, broadcasting/film/video in the Last Year: Never true  . Ran Out of Food in the Last Year: Never true  Transportation Needs: No Transportation Needs  . Lack of Transportation (Medical): No  . Lack of Transportation (Non-Medical): No  Physical Activity: Inactive  . Days of Exercise per Week: 0 days  . Minutes of Exercise per Session: 0 min  Stress: Stress Concern Present  . Feeling of Stress : To some extent  Social Connections: Socially Isolated  . Frequency of Communication with Friends and Family: Twice a week  . Frequency of Social  Gatherings with Friends and Family: Never  . Attends Religious Services: 1 to 4 times per year  . Active Member of Clubs or Organizations: No  . Attends Banker Meetings: Never  . Marital Status: Widowed  Intimate Partner Violence: Not At Risk  . Fear of Current or Ex-Partner: No  . Emotionally Abused: No  . Physically Abused: No  . Sexually Abused: No    Past Surgical History:  Procedure Laterality Date  . ABDOMINAL HYSTERECTOMY  1982  . BACK SURGERY    . BALLOON DILATION N/A 09/21/2018   Procedure: BALLOON DILATION;  Surgeon: Hilarie Fredrickson, MD;  Location: [facility] ENDOSCOPY;  Service: Endoscopy;  Laterality: N/A;  . BALLOON DILATION N/A 08/12/2019   Procedure: BALLOON DILATION;  Surgeon: Lynann Bologna, MD;  Location: [facility] ENDOSCOPY;  Service: Endoscopy;  Laterality: N/A;  . BIOPSY  08/12/2019   Procedure: BIOPSY;  Surgeon: Lynann Bologna, MD;  Location: [facility] ENDOSCOPY;  Service: Endoscopy;;  . BLADDER SUSPENSION  1980's  . BOTOX INJECTION N/A 09/21/2018   Procedure: BOTOX INJECTION;  Surgeon: Hilarie Fredrickson, MD;  Location: [facility] ENDOSCOPY;  Service: Endoscopy;  Laterality: N/A;  . BOTOX INJECTION N/A 08/12/2019   Procedure: BOTOX INJECTION;  Surgeon: Lynann Bologna, MD;  Location: [facility] ENDOSCOPY;  Service: Endoscopy;  Laterality: N/A;  . CATARACT EXTRACTION W/ INTRAOCULAR LENS  IMPLANT, BILATERAL Bilateral 2010  . DILATION AND CURETTAGE OF UTERUS  1961  . ESOPHAGEAL MANOMETRY N/A 03/25/2018   Procedure: ESOPHAGEAL MANOMETRY (EM);  Surgeon: Napoleon Form, MD;  Location: [facility] ENDOSCOPY;  Service: Endoscopy;  Laterality: N/A;  . ESOPHAGOGASTRODUODENOSCOPY (EGD) WITH PROPOFOL N/A 04/07/2018   Procedure: ESOPHAGOGASTRODUODENOSCOPY (EGD) WITH PROPOFOL;  Surgeon: Sherrilyn Rist, MD;  Location: [facility] ENDOSCOPY;  Service: Gastroenterology;  Laterality: N/A;  . ESOPHAGOGASTRODUODENOSCOPY (EGD) WITH PROPOFOL N/A 09/21/2018   Procedure: ESOPHAGOGASTRODUODENOSCOPY (EGD) WITH PROPOFOL;  Surgeon:  Hilarie Fredrickson, MD;  Location: [facility] ENDOSCOPY;  Service: Endoscopy;  Laterality: N/A;  . ESOPHAGOGASTRODUODENOSCOPY (EGD) WITH PROPOFOL N/A 08/12/2019   Procedure: ESOPHAGOGASTRODUODENOSCOPY (EGD) WITH PROPOFOL;  Surgeon: Lynann Bologna, MD;  Location: [facility] ENDOSCOPY;  Service: Endoscopy;  Laterality: N/A;  . HERNIA REPAIR    . HIATAL HERNIA REPAIR N/A 08/02/2016   Procedure: LAPAROSCOPIC REPAIR OF LARGE  HIATAL HERNIA;  Surgeon: Glenna Fellows, MD;  Location: [facility] ORS;  Service: General;  Laterality: N/A;  . JOINT REPLACEMENT    . LAPAROSCOPIC NISSEN FUNDOPLICATION N/A 08/02/2016   Procedure: LAPAROSCOPIC NISSEN FUNDOPLICATION;  Surgeon: Glenna Fellows, MD;  Location: [facility] ORS;  Service: General;  Laterality: N/A;  . LUMBAR LAMINECTOMY  1990; 1994; [id number]   "I've got 2 stainless steel rods; 6 screws; 2 ray cages"took bone from right hip to put in back  . RIGHT/LEFT HEART CATH AND CORONARY ANGIOGRAPHY N/A 07/26/2019   Procedure:  RIGHT/LEFT HEART CATH AND CORONARY ANGIOGRAPHY;  Surgeon: Lyn Records, MD;  Location: [facility] INVASIVE CV LAB;  Service: Cardiovascular;  Laterality: N/A;  . TEE WITHOUT CARDIOVERSION N/A 08/19/2019   Procedure: TRANSESOPHAGEAL ECHOCARDIOGRAM (TEE);  Surgeon: Lewayne Bunting, MD;  Location: [facility] ENDOSCOPY;  Service: Cardiovascular;  Laterality: N/A;  . TONSILLECTOMY AND ADENOIDECTOMY  1945  . TOTAL KNEE ARTHROPLASTY Right ~ 1996  . TUBAL LIGATION  ~ 1976    Family History  Problem Relation Age of Onset  . Kidney disease Mother   . Hypertension Mother   . Heart disease Father 102       die of MI at age 60  . Suicidality Son   . Heart disease Brother   . Heart attack Brother   . Colon cancer Neg Hx   . Esophageal cancer Neg Hx   . Pancreatic cancer Neg Hx   . Rectal cancer Neg Hx   . Stomach cancer Neg Hx     Allergies  Allergen Reactions  . Morphine Other (See Comments)    Flushing, rash, itching  . Tape Other (See Comments)    Band aides, adhesive  tape Redness and pulls skin off  . Tramadol Other (See Comments)    Pt has prolonged Qtc interval of 540- cannot give tramadol per pharmacy    Current Outpatient Medications on File Prior to Visit  Medication Sig Dispense Refill  . acetaminophen (TYLENOL) 500 MG tablet Take 1,000 mg by mouth every 6 (six) hours as needed for moderate pain, headache or fever.    Marland Kitchen amiodarone (PACERONE) 200 MG tablet Take 1 tablet (200 mg total) by mouth daily. 90 tablet 3  . amLODipine (NORVASC) 2.5 MG tablet amlodipine 2.5 mg tablet  TAKE 1 TABLET BY MOUTH EVERY DAY    . apixaban (ELIQUIS) 5 MG TABS tablet Take 1 tablet (5 mg total) by mouth 2 (two) times daily. 180 tablet 1  . Carboxymethylcellulose Sodium (REFRESH TEARS OP) Place 1 drop into both eyes at bedtime.    . carvedilol (COREG) 6.25 MG tablet Take 1 tablet (6.25 mg total) by mouth 2 (two) times daily with a meal. 180 tablet 3  . clonazePAM (KLONOPIN) 0.5 MG tablet TAKE 1 TABLET BY MOUTH TWICE A DAY AS NEEDED FOR ANXIETY 60 tablet 2  . DULoxetine (CYMBALTA) 60 MG capsule TAKE 1 CAPSULE BY MOUTH EVERY DAY 90 capsule 3  . ENTRESTO 97-103 MG TAKE 1 TABLET BY MOUTH TWICE A DAY 180 tablet 3  . furosemide (LASIX) 40 MG tablet TAKE 1 TABLET (40 MG TOTAL) BY MOUTH DAILY AS NEEDED FOR FLUID OR EDEMA. 90 tablet 3  . HYDROcodone-acetaminophen (NORCO/VICODIN) 5-325 MG tablet hydrocodone 5 mg-acetaminophen 325 mg tablet  TAKE 1 TABLET EVERY 6 HOURS AS NEEDED FOR SEVERE PAIN (IN PLACE OF TRAMADOL DUE TO PROLONGED QT).    Marland Kitchen omeprazole (PRILOSEC) 20 MG capsule TAKE 1 CAPSULE BY MOUTH EVERY DAY 90 capsule 3  . spironolactone (ALDACTONE) 25 MG tablet Take 1 tablet (25 mg total) by mouth daily. 90 tablet 2   No current facility-administered medications on file prior to visit.    BP 110/60   Pulse 75   Temp 98.2 F (36.8 C) (Oral)   Ht 5\' 1"  (1.549 m)   Wt 160 lb (72.6 kg)   SpO2 99%   BMI 30.23 kg/m       Objective:   Physical Exam Vitals and nursing  note reviewed.  Constitutional:      Appearance:  Normal appearance.  Cardiovascular:     Rate and Rhythm: Normal rate and regular rhythm.  Pulmonary:     Effort: Pulmonary effort is normal.     Breath sounds: Normal breath sounds.  Abdominal:     General: Abdomen is flat.     Palpations: Abdomen is soft.  Musculoskeletal:        General: Normal range of motion.  Skin:    General: Skin is warm and dry.     Capillary Refill: Capillary refill takes less than 2 seconds.  Neurological:     General: No focal deficit present.     Mental Status: She is alert and oriented to person, place, and time.     Gait: Gait abnormal (slow steady gait with cane).  Psychiatric:        Mood and Affect: Mood normal.        Behavior: Behavior normal.        Thought Content: Thought content normal.        Judgment: Judgment normal.       Assessment & Plan:  1. Other polyneuropathy - Will increase gabapentin to 300 mg TID. She will follow up if not improving  - gabapentin (NEURONTIN) 300 MG capsule; Take 1 capsule (300 mg total) by mouth 3 (three) times daily.  Dispense: 270 capsule; Refill: 1  2. Dyspnea, unspecified type  - albuterol (VENTOLIN HFA) 108 (90 Base) MCG/ACT inhaler; TAKE 2 PUFFS BY MOUTH EVERY 6 HOURS AS NEEDED FOR WHEEZE OR SHORTNESS OF BREATH  Dispense: 8 g; Refill: 2   Shirline Frees, NP

## 2021-04-03 NOTE — ED Notes (Signed)
Synchronized cardio version at 120 J  

## 2021-04-03 NOTE — ED Provider Notes (Signed)
[facility] EMERGENCY DEPARTMENT Provider Note   CSN: [id number] Arrival date & time: 04/03/21  1755     History Chief Complaint  Patient presents with  . Chest Pain    Julie Rice is a 85 y.o. female.  HPI  HPI: A 85 year old patient with a history of hypertension and hypercholesterolemia presents for evaluation of chest pain. Initial onset of pain was less than one hour ago. The patient's chest pain is described as heaviness/pressure/tightness and is not worse with exertion. The patient's chest pain is middle- or left-sided, is not well-localized, is not sharp and does radiate to the arms/jaw/neck. The patient does not complain of nausea and denies diaphoresis. The patient has no history of stroke, has no history of peripheral artery disease, has not smoked in the past 90 days, denies any history of treated diabetes, has no relevant family history of coronary artery disease (first degree relative at less than age 60) and does not have an elevated BMI (>=30). Presented to the ED for evaluation of sudden onset of chest pain around3:00 this afternoon.  Patient states she started having pressure in the center of her chest.  Pain was radiating to her jaw.  Patient started to feel lightheaded and her vision was blacking out.  Patient called EMS.  They noted to be tachycardic and tried administering Cardizem without relief.  At the bedside patient's heart rate was still in the 150s and she is still complaining of severe chest pain and jaw pain.  She said that she could not see me.  She denied any abdominal pain.  No vomiting or diarrhea.  No recent fevers  Past Medical History:  Diagnosis Date  . A-fib (HCC)   . Anemia   . Anxiety   . Bipolar disorder (HCC)   . Blood transfusion 1991   autologous pts own blood given   . CAD (coronary artery disease)   . Cataract   . Chest pain    "@ rest, lying down, w/exertion"  . CHF (congestive heart failure) (HCC) 07/2020  .  Chronic back pain    "mostly lower back but I do have upper back pain regularly" (04/06/2018)  . COPD (chronic obstructive pulmonary disease) (HCC)   . Depression   . Esophageal stricture   . Fibromyalgia   . GERD (gastroesophageal reflux disease)   . Heart murmur    "slight" (04/06/2018)  . Hiatal hernia   . Hyperlipidemia    Patient denies  . Hypertension   . Internal hemorrhoids   . Lumbago   . Mitral regurgitation   . Osteoarthritis   . Osteoporosis   . Pneumonia ~ 01/2018  . PONV (postoperative nausea and vomiting)    severe ponv, "in the past" (04/06/2018)  . Rectal bleeding   . Rheumatoid arthritis (HCC)   . Spondylosis   . TIA (transient ischemic attack) 2013  . Urinary incontinence    wears depends    Patient Active Problem List   Diagnosis Date Noted  . Wide-complex tachycardia (HCC) 04/03/2021  . Atrial fibrillation (HCC) 10/15/2020  . Chronic pain syndrome 02/16/2020  . Fibromyalgia 02/16/2020  . Myofascial pain 02/16/2020  . Rheumatoid arthritis involving multiple sites (HCC) 02/16/2020  . Pleural effusion, bilateral 09/08/2019  . Secondary cardiomyopathy (HCC)   . Elevated troponin 08/17/2019  . Chronic respiratory failure with hypoxia (HCC) 08/16/2019  . COPD (chronic obstructive pulmonary disease) (HCC) 08/16/2019  . Atrial fibrillation with RVR (HCC) 08/16/2019  . Community acquired pneumonia of  left lung 08/07/2019  . Prolonged QT interval 08/07/2019  . Sepsis (HCC) 08/07/2019  . Acute on chronic respiratory failure with hypoxia (HCC) 08/07/2019  . CHF (congestive heart failure) (HCC) 07/24/2019  . Chest pain   . DOE (dyspnea on exertion)   . Asthma 04/22/2019  . Abnormal CT of the chest 02/23/2019  . Esophageal motility disorder   . Esophageal dysmotility   . Loss of weight   . Moderate protein-calorie malnutrition (HCC)   . Hematemesis 04/06/2018  . Periesophageal hiatal hernia 08/02/2016  . Dysphagia 05/10/2016  . Esophageal stricture  05/10/2016  . Epigastric pain 05/10/2016  . Generalized anxiety disorder 02/06/2012  . Major depressive disorder, recurrent (HCC) 02/05/2012  . Hypokalemia 11/09/2011  . Nausea and vomiting in adult 11/08/2011  . Dehydration 11/08/2011  . Tremors of nervous system 11/08/2011  . Hypothyroidism 05/10/2008  . Dyslipidemia 05/10/2008  . UNSPECIFIED ANEMIA 05/10/2008  . CAROTID ARTERY DISEASE 05/10/2008  . Transient cerebral ischemia 05/10/2008  . Chronic back pain 05/10/2008  . OSTEOPENIA 05/10/2008  . Dyspnea 05/10/2008  . Upper airway cough syndrome 05/10/2008  . ESOPHAGEAL STRICTURE 01/19/2008  . Essential hypertension 04/29/2007  . Hiatal hernia with gastroesophageal reflux 04/29/2007  . Osteoarthritis 04/29/2007  . SPONDYLOSIS 04/29/2007    Past Surgical History:  Procedure Laterality Date  . ABDOMINAL HYSTERECTOMY  1982  . BACK SURGERY    . BALLOON DILATION N/A 09/21/2018   Procedure: BALLOON DILATION;  Surgeon: Hilarie Fredrickson, MD;  Location: [facility] ENDOSCOPY;  Service: Endoscopy;  Laterality: N/A;  . BALLOON DILATION N/A 08/12/2019   Procedure: BALLOON DILATION;  Surgeon: Lynann Bologna, MD;  Location: [facility] ENDOSCOPY;  Service: Endoscopy;  Laterality: N/A;  . BIOPSY  08/12/2019   Procedure: BIOPSY;  Surgeon: Lynann Bologna, MD;  Location: [facility] ENDOSCOPY;  Service: Endoscopy;;  . BLADDER SUSPENSION  1980's  . BOTOX INJECTION N/A 09/21/2018   Procedure: BOTOX INJECTION;  Surgeon: Hilarie Fredrickson, MD;  Location: [facility] ENDOSCOPY;  Service: Endoscopy;  Laterality: N/A;  . BOTOX INJECTION N/A 08/12/2019   Procedure: BOTOX INJECTION;  Surgeon: Lynann Bologna, MD;  Location: [facility] ENDOSCOPY;  Service: Endoscopy;  Laterality: N/A;  . CATARACT EXTRACTION W/ INTRAOCULAR LENS  IMPLANT, BILATERAL Bilateral 2010  . DILATION AND CURETTAGE OF UTERUS  1961  . ESOPHAGEAL MANOMETRY N/A 03/25/2018   Procedure: ESOPHAGEAL MANOMETRY (EM);  Surgeon: Napoleon Form, MD;  Location: [facility] ENDOSCOPY;  Service:  Endoscopy;  Laterality: N/A;  . ESOPHAGOGASTRODUODENOSCOPY (EGD) WITH PROPOFOL N/A 04/07/2018   Procedure: ESOPHAGOGASTRODUODENOSCOPY (EGD) WITH PROPOFOL;  Surgeon: Sherrilyn Rist, MD;  Location: [facility] ENDOSCOPY;  Service: Gastroenterology;  Laterality: N/A;  . ESOPHAGOGASTRODUODENOSCOPY (EGD) WITH PROPOFOL N/A 09/21/2018   Procedure: ESOPHAGOGASTRODUODENOSCOPY (EGD) WITH PROPOFOL;  Surgeon: Hilarie Fredrickson, MD;  Location: [facility] ENDOSCOPY;  Service: Endoscopy;  Laterality: N/A;  . ESOPHAGOGASTRODUODENOSCOPY (EGD) WITH PROPOFOL N/A 08/12/2019   Procedure: ESOPHAGOGASTRODUODENOSCOPY (EGD) WITH PROPOFOL;  Surgeon: Lynann Bologna, MD;  Location: [facility] ENDOSCOPY;  Service: Endoscopy;  Laterality: N/A;  . HERNIA REPAIR    . HIATAL HERNIA REPAIR N/A 08/02/2016   Procedure: LAPAROSCOPIC REPAIR OF LARGE  HIATAL HERNIA;  Surgeon: Glenna Fellows, MD;  Location: [facility] ORS;  Service: General;  Laterality: N/A;  . JOINT REPLACEMENT    . LAPAROSCOPIC NISSEN FUNDOPLICATION N/A 08/02/2016   Procedure: LAPAROSCOPIC NISSEN FUNDOPLICATION;  Surgeon: Glenna Fellows, MD;  Location: [facility] ORS;  Service: General;  Laterality: N/A;  . LUMBAR LAMINECTOMY  1990; 1994; [id number]   "I've got 2 stainless steel  rods; 6 screws; 2 ray cages"took bone from right hip to put in back  . RIGHT/LEFT HEART CATH AND CORONARY ANGIOGRAPHY N/A 07/26/2019   Procedure: RIGHT/LEFT HEART CATH AND CORONARY ANGIOGRAPHY;  Surgeon: Lyn Records, MD;  Location: [facility] INVASIVE CV LAB;  Service: Cardiovascular;  Laterality: N/A;  . TEE WITHOUT CARDIOVERSION N/A 08/19/2019   Procedure: TRANSESOPHAGEAL ECHOCARDIOGRAM (TEE);  Surgeon: Lewayne Bunting, MD;  Location: [facility] ENDOSCOPY;  Service: Cardiovascular;  Laterality: N/A;  . TONSILLECTOMY AND ADENOIDECTOMY  1945  . TOTAL KNEE ARTHROPLASTY Right ~ 1996  . TUBAL LIGATION  ~ 1976     OB History    Gravida  3   Para  2   Term      Preterm      AB  1   Living  0     SAB  1   IAB      Ectopic       Multiple      Live Births              Family History  Problem Relation Age of Onset  . Kidney disease Mother   . Hypertension Mother   . Heart disease Father 56       die of MI at age 65  . Suicidality Son   . Heart disease Brother   . Heart attack Brother   . Colon cancer Neg Hx   . Esophageal cancer Neg Hx   . Pancreatic cancer Neg Hx   . Rectal cancer Neg Hx   . Stomach cancer Neg Hx     Social History   Tobacco Use  . Smoking status: Never Smoker  . Smokeless tobacco: Never Used  Vaping Use  . Vaping Use: Never used  Substance Use Topics  . Alcohol use: Never  . Drug use: Never    Home Medications Prior to Admission medications   Medication Sig Start Date End Date Taking? Authorizing Provider  acetaminophen (TYLENOL) 500 MG tablet Take 1,000 mg by mouth every 6 (six) hours as needed for moderate pain, headache or fever.    [provider]  albuterol (VENTOLIN HFA) 108 (90 Base) MCG/ACT inhaler TAKE 2 PUFFS BY MOUTH EVERY 6 HOURS AS NEEDED FOR WHEEZE OR SHORTNESS OF BREATH 04/03/21   Nafziger, [name], NP  amiodarone (PACERONE) 200 MG tablet Take 1 tablet (200 mg total) by mouth daily. 07/24/20   Wendall Stade, MD  amLODipine (NORVASC) 2.5 MG tablet amlodipine 2.5 mg tablet  TAKE 1 TABLET BY MOUTH EVERY DAY    [provider]  apixaban (ELIQUIS) 5 MG TABS tablet Take 1 tablet (5 mg total) by mouth 2 (two) times daily. 09/25/20 09/20/21  Wendall Stade, MD  Carboxymethylcellulose Sodium (REFRESH TEARS OP) Place 1 drop into both eyes at bedtime.    [provider]  carvedilol (COREG) 6.25 MG tablet Take 1 tablet (6.25 mg total) by mouth 2 (two) times daily with a meal. 07/24/20 07/19/21  Wendall Stade, MD  clonazePAM (KLONOPIN) 0.5 MG tablet TAKE 1 TABLET BY MOUTH TWICE A DAY AS NEEDED FOR ANXIETY 03/20/21   Nafziger, [name], NP  DULoxetine (CYMBALTA) 60 MG capsule TAKE 1 CAPSULE BY MOUTH EVERY DAY 09/25/20   Lovorn, Aundra Millet, MD  ENTRESTO 97-103  MG TAKE 1 TABLET BY MOUTH TWICE A DAY 12/04/20   Wendall Stade, MD  furosemide (LASIX) 40 MG tablet TAKE 1 TABLET (40 MG TOTAL) BY MOUTH DAILY AS NEEDED FOR FLUID OR  EDEMA. 01/03/21   Wendall Stade, MD  gabapentin (NEURONTIN) 300 MG capsule Take 1 capsule (300 mg total) by mouth 3 (three) times daily. 04/03/21 07/02/21  Nafziger, [name], NP  HYDROcodone-acetaminophen (NORCO/VICODIN) 5-325 MG tablet hydrocodone 5 mg-acetaminophen 325 mg tablet  TAKE 1 TABLET EVERY 6 HOURS AS NEEDED FOR SEVERE PAIN (IN PLACE OF TRAMADOL DUE TO PROLONGED QT).    [provider]  omeprazole (PRILOSEC) 20 MG capsule TAKE 1 CAPSULE BY MOUTH EVERY DAY 09/25/20   Nafziger, [name], NP  spironolactone (ALDACTONE) 25 MG tablet Take 1 tablet (25 mg total) by mouth daily. 11/20/20   Wendall Stade, MD    Allergies    Morphine, Tape, and Tramadol  Review of Systems   Review of Systems  All other systems reviewed and are negative.   Physical Exam Updated Vital Signs BP (!) 108/54   Pulse (!) 40   Temp 97.6 F (36.4 C) (Oral)   Resp 11   SpO2 92%   Physical Exam Vitals and nursing note reviewed.  Constitutional:      General: She is in acute distress.     Appearance: She is well-developed. She is ill-appearing and diaphoretic.  HENT:     Head: Normocephalic and atraumatic.     Right Ear: External ear normal.     Left Ear: External ear normal.  Eyes:     General: No scleral icterus.       Right eye: No discharge.        Left eye: No discharge.     Conjunctiva/sclera: Conjunctivae normal.  Neck:     Trachea: No tracheal deviation.  Cardiovascular:     Rate and Rhythm: Regular rhythm. Tachycardia present.  Pulmonary:     Effort: Pulmonary effort is normal. No respiratory distress.     Breath sounds: Normal breath sounds. No stridor. No wheezing or rales.  Abdominal:     General: Bowel sounds are normal. There is no distension.     Palpations: Abdomen is soft.     Tenderness: There is no abdominal  tenderness. There is no guarding or rebound.  Musculoskeletal:        General: No tenderness.     Cervical back: Neck supple.     Right lower leg: No edema.     Left lower leg: No edema.  Skin:    General: Skin is warm.     Findings: No rash.  Neurological:     Mental Status: She is alert.     Cranial Nerves: No cranial nerve deficit (no facial droop, extraocular movements intact, no slurred speech).     Sensory: No sensory deficit.     Motor: No abnormal muscle tone or seizure activity.     Coordination: Coordination normal.     ED Results / Procedures / Treatments   Labs (all labs ordered are listed, but only abnormal results are displayed) Labs Reviewed  BASIC METABOLIC PANEL - Abnormal; Notable for the following components:      Result Value   Sodium 133 (*)    Glucose, Bld 132 (*)    BUN 25 (*)    Creatinine, Ser 1.54 (*)    Calcium 8.8 (*)    GFR, Estimated 33 (*)    All other components within normal limits  CBC - Abnormal; Notable for the following components:   RBC 3.69 (*)    Hemoglobin 11.6 (*)    All other components within normal limits  CBG MONITORING, ED - Abnormal;  Notable for the following components:   Glucose-Capillary 128 (*)    All other components within normal limits  RESP PANEL BY RT-PCR (FLU A&B, COVID) ARPGX2  TROPONIN I (HIGH SENSITIVITY)  TROPONIN I (HIGH SENSITIVITY)    EKG EKG Interpretation  Date/Time:  Tuesday Apr 03 2021 18:05:58 EDT Ventricular Rate:  153 PR Interval:  208 QRS Duration: 123 QT Interval:  323 QTC Calculation: 516 R Axis:   260 Text Interpretation: Wide-QRS tachycardia RBBB and LAFB No old tracing to compare Confirmed by Linwood Dibbles [id number]) on 04/03/2021 6:17:54 PM   EKG Interpretation  Date/Time:  Tuesday Apr 03 2021 19:02:19 EDT Ventricular Rate:  88 PR Interval:  181 QRS Duration: 86 QT Interval:  399 QTC Calculation: 483 R Axis:   -30 Text Interpretation: Sinus rhythm Ventricular premature complex Left  axis deviation Abnormal R-wave progression, early transition No significant changes since EKG earlier today Confirmed by Linwood Dibbles [phone]) on 04/03/2021 7:05:09 PM       Radiology DG Chest Portable 1 View  Result Date: 04/03/2021 CLINICAL DATA:  Chest pain EXAM: PORTABLE CHEST 1 VIEW COMPARISON:  02/14/2021 FINDINGS: Mild hyperinflation. Heart is normal size. No confluent opacities or effusions. Aortic atherosclerosis. IMPRESSION: Hyperinflation.  No active disease. Electronically Signed   By: Charlett Nose M.D.   On: 04/03/2021 18:33    Procedures .Cardioversion  Date/Time: 04/03/2021 6:21 PM Performed by: Linwood Dibbles, MD Authorized by: Linwood Dibbles, MD   Consent:    Consent obtained:  Emergent situation   Consent given by:  Patient Pre-procedure details:    Cardioversion basis:  Emergent   Pre-procedure rhythm: Wide-complex tachycardia possible V. tach. Attempt one:    Cardioversion mode:  Synchronous   Waveform:  Biphasic   Shock (Joules):  120 ( )   Shock outcome:  No change in rhythm Attempt two:    Cardioversion mode:  Synchronous   Waveform:  Biphasic   Shock (Joules):  200   Shock outcome:  Conversion to normal sinus rhythm Post-procedure details:    Patient status:  Awake   Patient tolerance of procedure:  Tolerated well, no immediate complications Comments:     Patient was given 1.5 mg/kg dose of propofol prior to the procedure.  She was sedated but still alert and speaking    .Critical Care Performed by: Linwood Dibbles, MD Authorized by: Linwood Dibbles, MD   Critical care provider statement:    Critical care time (minutes):  45   Critical care was time spent personally by me on the following activities:  Discussions with consultants, evaluation of patient's response to treatment, examination of patient, ordering and performing treatments and interventions, ordering and review of laboratory studies, ordering and review of radiographic studies, pulse oximetry,  re-evaluation of patient's condition, obtaining history from patient or surrogate and review of old charts .Cardioversion  Date/Time: 04/03/2021 7:24 PM Performed by: Linwood Dibbles, MD Authorized by: Linwood Dibbles, MD   Consent:    Consent obtained:  Emergent situation Pre-procedure details:    Cardioversion basis:  Emergent Attempt one:    Cardioversion mode:  Synchronous   Shock (Joules):  200   Shock outcome:  Conversion to normal sinus rhythm     Medications Ordered in ED Medications  aspirin chewable tablet 324 mg (324 mg Oral Not Given 04/03/21 1847)  amiodarone (NEXTERONE PREMIX) 360-4.14 MG/200ML-% (1.8 mg/mL) IV infusion (0 mg/hr Intravenous Stopped 04/03/21 1852)  amiodarone (NEXTERONE PREMIX) 360-4.14 MG/200ML-% (1.8 mg/mL) IV infusion (30 mg/hr Intravenous New Bag/Given 04/03/21  1925)  propofol (DIPRIVAN) 10 mg/mL bolus/IV push (40 mg Intravenous Given 04/03/21 1806)  aspirin chewable tablet 324 mg (324 mg Oral Given by EMS 04/03/21 1837)  lactated ringers bolus 1,000 mL (1,000 mLs Intravenous New Bag/Given 04/03/21 1934)    ED Course  I have reviewed the triage vital signs and the nursing notes.  Pertinent labs & imaging results that were available during my care of the patient were reviewed by me and considered in my medical decision making (see chart for details).  Clinical Course as of 04/03/21 2049  Tue Apr 03, 2021  1820 Patient is feeling better after cardioversion procedure [JK]  1822 Pressure slightly improved.  Now into the mid 90s at the bedside [JK]  1833 Patient states now however she does have some tingling in her hands.  Still some slight jaw discomfort [JK]  1853 Patient's blood pressure is now dropped.  We will hold the amiodarone.  We will give a fluid bolus [JK]  1909 Discussed with Cardiology.  Hx of non obstructive CAD.  She has nonsustained vt in the past vs aberrant V fib. [JK]  1924 Patient went back into a wide-complex tachycardia with hypotension in  the 70s.  Proceeded with synchronized cardioversion again.  Patient is now back in sinus rhythm once again [JK]  2045 Patient has remained stable after the second cardioversion.  No recurrent episodes of tachycardia.  No recurrent hypotension on the lower dose of the amiodarone [JK]    Clinical Course User Index [JK] Linwood Dibbles, MD   MDM Rules/Calculators/A&P HEAR Score: 6                        Patient presented to the ED for evaluation of chest pain and tachycardia.  Patient had a wide-complex tachycardia.  It was regular , unclear if this was atrial flutter with aberrancy versus ventricular tachycardia.  Patient became unstable and hypotensive.  She underwent synchronized cardioversion in the emergency department.  Patient responded but she continued to have chest pain.  Patient was on amiodarone and then became hypotensive.  After this was stopped for her hypotension she had a recurrent episodes of wide-complex tachycardia.  Once again her blood pressure dropped so I proceeded with synchronized cardioversion a second time.  Patient converted back to sinus rhythm and since that time she has remained in sinus rhythm.  She is tolerating the lower dose of the amiodarone infusion.  Initial troponin is normal.  No signs of cardiac injury.  I consulted with cardiology.  Appreciate their assistance.  Plan will be for admission and further treatment Final Clinical Impression(s) / ED Diagnoses Final diagnoses:  Wide-complex tachycardia (HCC)      Linwood Dibbles, MD 04/03/21 2050

## 2021-04-04 ENCOUNTER — Inpatient Hospital Stay (HOSPITAL_COMMUNITY): Payer: Medicare Other

## 2021-04-04 DIAGNOSIS — I472 Ventricular tachycardia: Secondary | ICD-10-CM | POA: Diagnosis not present

## 2021-04-04 LAB — CBC
HCT: 32.9 % — ABNORMAL LOW (ref 36.0–46.0)
Hemoglobin: 10.8 g/dL — ABNORMAL LOW (ref 12.0–15.0)
MCH: 32 pg (ref 26.0–34.0)
MCHC: 32.8 g/dL (ref 30.0–36.0)
MCV: 97.3 fL (ref 80.0–100.0)
Platelets: 233 10*3/uL (ref 150–400)
RBC: 3.38 MIL/uL — ABNORMAL LOW (ref 3.87–5.11)
RDW: 13.2 % (ref 11.5–15.5)
WBC: 10.8 10*3/uL — ABNORMAL HIGH (ref 4.0–10.5)
nRBC: 0 % (ref 0.0–0.2)

## 2021-04-04 LAB — BASIC METABOLIC PANEL
Anion gap: 6 (ref 5–15)
BUN: 19 mg/dL (ref 8–23)
CO2: 24 mmol/L (ref 22–32)
Calcium: 8.4 mg/dL — ABNORMAL LOW (ref 8.9–10.3)
Chloride: 105 mmol/L (ref 98–111)
Creatinine, Ser: 1.09 mg/dL — ABNORMAL HIGH (ref 0.44–1.00)
GFR, Estimated: 50 mL/min — ABNORMAL LOW (ref >60)
Glucose, Bld: 101 mg/dL — ABNORMAL HIGH (ref 70–99)
Potassium: 4.8 mmol/L (ref 3.5–5.1)
Sodium: 135 mmol/L (ref 135–145)

## 2021-04-04 LAB — ECHOCARDIOGRAM COMPLETE
AV Vena cont: 0.3 cm
Area-P 1/2: 2.42 cm2
Height: 61 in
S' Lateral: 1.9 cm
Weight: 2609.6 oz

## 2021-04-04 LAB — TSH: TSH: 1.588 u[IU]/mL (ref 0.350–4.500)

## 2021-04-04 LAB — TROPONIN I (HIGH SENSITIVITY): Troponin I (High Sensitivity): 33 ng/L — ABNORMAL HIGH (ref <18)

## 2021-04-04 LAB — MAGNESIUM: Magnesium: 2 mg/dL (ref 1.7–2.4)

## 2021-04-04 MED ORDER — AMIODARONE HCL 200 MG PO TABS
200.0000 mg | ORAL_TABLET | Freq: Two times a day (BID) | ORAL | Status: DC
  Administered 2021-04-04 – 2021-04-05 (×3): 200 mg via ORAL
  Filled 2021-04-04 (×3): qty 1

## 2021-04-04 MED ORDER — DULOXETINE HCL 60 MG PO CPEP
60.0000 mg | ORAL_CAPSULE | Freq: Every day | ORAL | Status: DC
  Administered 2021-04-04 – 2021-04-05 (×2): 60 mg via ORAL
  Filled 2021-04-04 (×2): qty 1

## 2021-04-04 MED ORDER — CLONAZEPAM 0.5 MG PO TABS
0.5000 mg | ORAL_TABLET | Freq: Every day | ORAL | Status: DC | PRN
  Administered 2021-04-04: 0.5 mg via ORAL
  Filled 2021-04-04: qty 1

## 2021-04-04 MED ORDER — GABAPENTIN 300 MG PO CAPS
300.0000 mg | ORAL_CAPSULE | Freq: Three times a day (TID) | ORAL | Status: DC
  Administered 2021-04-04 – 2021-04-05 (×4): 300 mg via ORAL
  Filled 2021-04-04 (×4): qty 1

## 2021-04-04 MED ORDER — METOPROLOL TARTRATE 50 MG PO TABS
50.0000 mg | ORAL_TABLET | Freq: Two times a day (BID) | ORAL | Status: DC
  Administered 2021-04-04 – 2021-04-05 (×3): 50 mg via ORAL
  Filled 2021-04-04 (×3): qty 1

## 2021-04-04 MED ORDER — ONDANSETRON HCL 4 MG/2ML IJ SOLN: 4.0000 mg | Freq: Four times a day (QID) | INTRAMUSCULAR | Status: DC | PRN

## 2021-04-04 MED ORDER — NITROGLYCERIN 0.4 MG SL SUBL: 0.4000 mg | SUBLINGUAL_TABLET | SUBLINGUAL | Status: DC | PRN

## 2021-04-04 MED ORDER — ACETAMINOPHEN 325 MG PO TABS: 650.0000 mg | ORAL_TABLET | ORAL | Status: DC | PRN

## 2021-04-04 MED ORDER — APIXABAN 5 MG PO TABS
5.0000 mg | ORAL_TABLET | Freq: Two times a day (BID) | ORAL | Status: DC
Start: 2021-04-04 — End: 2021-04-05
  Administered 2021-04-04 – 2021-04-05 (×3): 5 mg via ORAL
  Filled 2021-04-04 (×3): qty 1

## 2021-04-04 MED ORDER — PANTOPRAZOLE SODIUM 40 MG PO TBEC
40.0000 mg | DELAYED_RELEASE_TABLET | Freq: Every day | ORAL | Status: DC
  Administered 2021-04-04 – 2021-04-05 (×2): 40 mg via ORAL
  Filled 2021-04-04 (×2): qty 1

## 2021-04-04 NOTE — Progress Notes (Signed)
Echocardiogram 2D Echocardiogram has been performed.  Julie Rice 04/04/2021, 2:56 PM

## 2021-04-04 NOTE — Progress Notes (Signed)
Progress Note  Patient Name: Julie Rice Date of Encounter: 04/04/2021  [facility] Cardiologist: Charlton Haws, MD   Subjective   No CP or dyspnea  Inpatient Medications    Scheduled Meds: . apixaban  5 mg Oral BID  . aspirin  324 mg Oral Once  . DULoxetine  60 mg Oral Daily  . gabapentin  300 mg Oral TID  . pantoprazole  40 mg Oral Daily   Continuous Infusions: . amiodarone 30 mg/hr (04/04/21 0332)   PRN Meds: acetaminophen, clonazePAM, nitroGLYCERIN, ondansetron (ZOFRAN) IV   Vital Signs    Vitals:   04/04/21 0343 04/04/21 0735 04/04/21 0747 04/04/21 0748  BP:  134/64    Pulse:    72  Resp:    18  Temp: 97.6 F (36.4 C)  98 F (36.7 C)   TempSrc: Oral     SpO2:    100%  Weight:      Height:        Intake/Output Summary (Last 24 hours) at 04/04/2021 1106 Last data filed at 04/04/2021 0900 Gross per 24 hour  Intake 2910.59 ml  Output 925 ml  Net 1985.59 ml   Last 3 Weights 04/04/2021 04/03/2021 04/03/2021  Weight (lbs) 163 lb 1.6 oz 159 lb 160 lb  Weight (kg) 73.982 kg 72.122 kg 72.576 kg      Telemetry    Sinus with PVCs - Personally Reviewed  Physical Exam   GEN: No acute distress.   Neck: No JVD Cardiac: RRR, no murmurs, rubs, or gallops.  Respiratory: Clear to auscultation bilaterally. GI: Soft, nontender, non-distended  MS: No edema Neuro:  Nonfocal  Psych: Normal affect   Labs    High Sensitivity Troponin:   Recent Labs  Lab 04/03/21 1840 04/03/21 2026 04/04/21 0318  TROPONINIHS 7 18* 33*      Chemistry Recent Labs  Lab 04/03/21 1840 04/04/21 0318  NA 133* 135  K 4.7 4.8  CL 100 105  CO2 22 24  GLUCOSE 132* 101*  BUN 25* 19  CREATININE 1.54* 1.09*  CALCIUM 8.8* 8.4*  GFRNONAA 33* 50*  ANIONGAP 11 6     Hematology Recent Labs  Lab 04/03/21 1840 04/04/21 0318  WBC 9.6 10.8*  RBC 3.69* 3.38*  HGB 11.6* 10.8*  HCT 36.2 32.9*  MCV 98.1 97.3  MCH 31.4 32.0  MCHC 32.0 32.8  RDW 13.1 13.2  PLT 297 233     Radiology    DG Chest Portable 1 View  Result Date: 04/03/2021 CLINICAL DATA:  Chest pain EXAM: PORTABLE CHEST 1 VIEW COMPARISON:  02/14/2021 FINDINGS: Mild hyperinflation. Heart is normal size. No confluent opacities or effusions. Aortic atherosclerosis. IMPRESSION: Hyperinflation.  No active disease. Electronically Signed   By: Charlett Nose M.D.   On: 04/03/2021 18:33    Patient Profile     85 y.o. female with past medical history of mild nonobstructive coronary disease by catheterization in 2020, paroxysmal atrial fibrillation, COPD, previous TIA admitted with wide-complex tachycardia.  Patient developed sudden onset palpitations with substernal chest burning similar to her previous symptoms with atrial fibrillation.  In the emergency room she underwent emergent cardioversion as she was hemodynamically unstable.  She was admitted.  Assessment & Plan    1 wide-complex tachycardia-rate was approximately 150.  Question atrial flutter with aberrancy given history of paroxysmal atrial fibrillation.  Patient required emergent cardioversion x2.  We will continue IV amiodarone today and transition back to oral tomorrow if she remains stable.  We  will repeat echocardiogram to reassess LV function.  I will ask the electrophysiologist to review her rhythm.  2 PAF-continue amiodarone and apixaban.  3 minimally elevated troponin-this occurred in the setting of cardioversion.  Will not pursue further ischemia evaluation.  She had a cardiac catheterization in September 2020 that showed nonobstructive coronary disease.  She only has symptoms when she goes into atrial fibrillation.  4 history of cardiomyopathy-medications are on hold due to hypotension emergency room.  We will likely resume lower doses tomorrow morning pending follow-up blood pressure.  Patient was on Entresto, carvedilol, Lasix, spironolactone and amlodipine.  For questions or updates, please contact [facility] Please consult  [email or web address] for contact info under        Signed, Olga Millers, MD  04/04/2021, 11:06 AM

## 2021-04-04 NOTE — Progress Notes (Signed)
Pt has expressed the need for clonazepam twice daily for chronic anxiety usually takes one in morning and at night. Will save the currentl  Order for night-time if she has too.

## 2021-04-04 NOTE — Telephone Encounter (Signed)
**Note De-Identified Adesuwa Osgood Obfuscation** No answer so I left a VM asking the pt to call Larita Fife back at Dr Fabio Bering office at [facility] at [phone].

## 2021-04-04 NOTE — H&P (View-Only) (Signed)
ELECTROPHYSIOLOGY CONSULT NOTE    Patient ID: Julie Rice MRN: [id number], DOB/AGE: [birth year] 83 y.o.  Admit date: 04/03/2021 Date of Consult: 04/04/2021  Primary Physician: Shirline Frees, NP Primary Cardiologist: Charlton Haws, MD  Electrophysiologist: Dr. Elberta Fortis  Referring Provider: Dr. Jens Som  Patient Profile: Julie Rice is a 85 y.o. female with a hx of  non obstructivecoronary disease, chronic systolic CHF with improved LVEF, fibromyalgia, hypertension, paroxysmal atrial fibrillation chronically anticoagulated on apixaban, TIA, COPD on oxygen, esophageal stricture status post balloon dilatation, large symptomatic hiatal hernia status post fundoplication, chronic dysphagia  who is being seen 04/03/2021 for the evaluation of WCT at the request of Dr. Jens Som  HPI:  Julie Rice is a 85 y.o. female with medical history as above.   Presented to [facility] 04/03/2021 with sudden onset chest pain and palpitations. This lasted about an hour before she called EMS and found to have [facility] 170-180s not responsive to diltiazem. She was unstable with hypotension in the 60s on arrival and was cardioverted urgently at 120J -> 200J with restoration of NSR. She reverted to her WCT within the hour requiring repeat [facility] and IV amiodarone. Her chest pain resolved in NSR, with gradual resolution of her jaw discomfort afertward.  Pertinent labs on admission included AKI with Cr 1.5 (baseline 0.9-1.1), K 5.0, Mg 2.0. TSH WNL. COVID/Flu negative.  Update to echo pending.   Pt states she has had these symptoms before. She had returned from a pulmonary appointment and set down when she noticed sudden onset chest pain and tachypalpitations with discomfort that radiated to her left elbow. She has had similar symptoms with atrial fibrillation, but his was the worse she's ever had it. She had discomfort in the jaw, neck, and back of her head as well.  She also noted visual changes.  Past Medical History:   Diagnosis Date  . A-fib (HCC)   . Anemia   . Anxiety   . Bipolar disorder (HCC)   . Blood transfusion 1991   autologous pts own blood given   . CAD (coronary artery disease)   . Cataract   . Chest pain    "@ rest, lying down, w/exertion"  . CHF (congestive heart failure) (HCC) 07/2020  . Chronic back pain    "mostly lower back but I do have upper back pain regularly" (04/06/2018)  . COPD (chronic obstructive pulmonary disease) (HCC)   . Depression   . Esophageal stricture   . Fibromyalgia   . GERD (gastroesophageal reflux disease)   . Heart murmur    "slight" (04/06/2018)  . Hiatal hernia   . Hyperlipidemia    Patient denies  . Hypertension   . Internal hemorrhoids   . Lumbago   . Mitral regurgitation   . Osteoarthritis   . Osteoporosis   . Pneumonia ~ 01/2018  . PONV (postoperative nausea and vomiting)    severe ponv, "in the past" (04/06/2018)  . Rectal bleeding   . Rheumatoid arthritis (HCC)   . Spondylosis   . TIA (transient ischemic attack) 2013  . Urinary incontinence    wears depends     Surgical History:  Past Surgical History:  Procedure Laterality Date  . ABDOMINAL HYSTERECTOMY  1982  . BACK SURGERY    . BALLOON DILATION N/A 09/21/2018   Procedure: BALLOON DILATION;  Surgeon: Hilarie Fredrickson, MD;  Location: [facility];  Service: Endoscopy;  Laterality: N/A;  . BALLOON DILATION N/A 08/12/2019   Procedure: BALLOON DILATION;  Surgeon: Chales Abrahams,  Rajesh, MD;  Location: [facility] ENDOSCOPY;  Service: Endoscopy;  Laterality: N/A;  . BIOPSY  08/12/2019   Procedure: BIOPSY;  Surgeon: Gupta, Rajesh, MD;  Location: [facility] ENDOSCOPY;  Service: Endoscopy;;  . BLADDER SUSPENSION  1980's  . BOTOX INJECTION N/A 09/21/2018   Procedure: BOTOX INJECTION;  Surgeon: Perry, John N, MD;  Location: [facility] ENDOSCOPY;  Service: Endoscopy;  Laterality: N/A;  . BOTOX INJECTION N/A 08/12/2019   Procedure: BOTOX INJECTION;  Surgeon: Gupta, Rajesh, MD;  Location: [facility] ENDOSCOPY;  Service: Endoscopy;   Laterality: N/A;  . CATARACT EXTRACTION W/ INTRAOCULAR LENS  IMPLANT, BILATERAL Bilateral 2010  . DILATION AND CURETTAGE OF UTERUS  1961  . ESOPHAGEAL MANOMETRY N/A 03/25/2018   Procedure: ESOPHAGEAL MANOMETRY (EM);  Surgeon: Nandigam, Kavitha V, MD;  Location: [facility];  Service: Endoscopy;  Laterality: N/A;  . ESOPHAGOGASTRODUODENOSCOPY (EGD) WITH PROPOFOL N/A 04/07/2018   Procedure: ESOPHAGOGASTRODUODENOSCOPY (EGD) WITH PROPOFOL;  Surgeon: [name], Henry L III, MD;  Location: [facility];  Service: Gastroenterology;  Laterality: N/A;  . ESOPHAGOGASTRODUODENOSCOPY (EGD) WITH PROPOFOL N/A 09/21/2018   Procedure: ESOPHAGOGASTRODUODENOSCOPY (EGD) WITH PROPOFOL;  Surgeon: Perry, John N, MD;  Location: [facility];  Service: Endoscopy;  Laterality: N/A;  . ESOPHAGOGASTRODUODENOSCOPY (EGD) WITH PROPOFOL N/A 08/12/2019   Procedure: ESOPHAGOGASTRODUODENOSCOPY (EGD) WITH PROPOFOL;  Surgeon: Gupta, Rajesh, MD;  Location: [facility];  Service: Endoscopy;  Laterality: N/A;  . HERNIA REPAIR    . HIATAL HERNIA REPAIR N/A 08/02/2016   Procedure: LAPAROSCOPIC REPAIR OF LARGE  HIATAL HERNIA;  Surgeon: Benjamin Hoxworth, MD;  Location: [facility];  Service: General;  Laterality: N/A;  . JOINT REPLACEMENT    . LAPAROSCOPIC NISSEN FUNDOPLICATION N/A 08/02/2016   Procedure: LAPAROSCOPIC NISSEN FUNDOPLICATION;  Surgeon: Benjamin Hoxworth, MD;  Location: [facility];  Service: General;  Laterality: N/A;  . LUMBAR LAMINECTOMY  1990; 1994; 1997;2000   "I've got 2 stainless steel rods; 6 screws; 2 ray cages"took bone from right hip to put in back  . RIGHT/LEFT HEART CATH AND CORONARY ANGIOGRAPHY N/A 07/26/2019   Procedure: RIGHT/LEFT HEART CATH AND CORONARY ANGIOGRAPHY;  Surgeon: Smith, Henry W, MD;  Location: [facility];  Service: Cardiovascular;  Laterality: N/A;  . TEE WITHOUT CARDIOVERSION N/A 08/19/2019   Procedure: TRANSESOPHAGEAL ECHOCARDIOGRAM (TEE);  Surgeon: Crenshaw, Brian S, MD;  Location: [facility];   Service: Cardiovascular;  Laterality: N/A;  . TONSILLECTOMY AND ADENOIDECTOMY  1945  . TOTAL KNEE ARTHROPLASTY Right ~ 1996  . TUBAL LIGATION  ~ 1976     Medications Prior to Admission  Medication Sig Dispense Refill Last Dose  . acetaminophen (TYLENOL) 500 MG tablet Take 1,000 mg by mouth every 6 (six) hours as needed for moderate pain, headache or fever.   04/03/2021 at Unknown time  . albuterol (VENTOLIN HFA) 108 (90 Base) MCG/ACT inhaler TAKE 2 PUFFS BY MOUTH EVERY 6 HOURS AS NEEDED FOR WHEEZE OR SHORTNESS OF BREATH (Patient taking differently: Inhale 2 puffs into the lungs every 6 (six) hours as needed for wheezing or shortness of breath.) 8 g 2 04/03/2021 at Unknown time  . amiodarone (PACERONE) 200 MG tablet Take 1 tablet (200 mg total) by mouth daily. 90 tablet 3 04/03/2021 at Unknown time  . amLODipine (NORVASC) 2.5 MG tablet Take 2.5 mg by mouth daily.   04/03/2021 at Unknown time  . apixaban (ELIQUIS) 5 MG TABS tablet Take 1 tablet (5 mg total) by mouth 2 (two) times daily. 180 tablet 1 04/03/2021 at 0800  . Carboxymethylcellulose Sodium (REFRESH TEARS OP) Place   1 drop into both eyes at bedtime.   04/02/2021 at Unknown time  . carvedilol (COREG) 6.25 MG tablet Take 1 tablet (6.25 mg total) by mouth 2 (two) times daily with a meal. 180 tablet 3 04/03/2021 at 0500  . cetirizine (ZYRTEC) 10 MG tablet Take 10 mg by mouth daily as needed for allergies.   unk  . clonazePAM (KLONOPIN) 0.5 MG tablet TAKE 1 TABLET BY MOUTH TWICE A DAY AS NEEDED FOR ANXIETY (Patient taking differently: Take 0.5 mg by mouth 2 (two) times daily as needed for anxiety.) 60 tablet 2 04/03/2021 at Unknown time  . DULoxetine (CYMBALTA) 60 MG capsule TAKE 1 CAPSULE BY MOUTH EVERY DAY (Patient taking differently: Take 60 mg by mouth daily.) 90 capsule 3 04/03/2021 at Unknown time  . ENTRESTO 97-103 MG TAKE 1 TABLET BY MOUTH TWICE A DAY (Patient taking differently: Take 1 tablet by mouth 2 (two) times daily.) 180 tablet 3 04/03/2021  at Unknown time  . furosemide (LASIX) 40 MG tablet TAKE 1 TABLET (40 MG TOTAL) BY MOUTH DAILY AS NEEDED FOR FLUID OR EDEMA. (Patient taking differently: Take 40 mg by mouth daily as needed for fluid.) 90 tablet 3 04/02/2021 at Unknown time  . gabapentin (NEURONTIN) 300 MG capsule Take 1 capsule (300 mg total) by mouth 3 (three) times daily. 270 capsule 1   . Multiple Vitamins-Minerals (ONE-A-DAY WOMENS 50+) TABS Take 1 tablet by mouth daily.   04/03/2021 at Unknown time  . omeprazole (PRILOSEC) 20 MG capsule TAKE 1 CAPSULE BY MOUTH EVERY DAY (Patient taking differently: Take 20 mg by mouth daily.) 90 capsule 3 04/03/2021 at Unknown time  . spironolactone (ALDACTONE) 25 MG tablet Take 1 tablet (25 mg total) by mouth daily. 90 tablet 2 04/03/2021 at Unknown time  . gabapentin (NEURONTIN) 100 MG capsule Take 100 mg by mouth 3 (three) times daily. (Patient not taking: No sig reported)   Not Taking at Unknown time  . HYDROcodone-acetaminophen (NORCO/VICODIN) 5-325 MG tablet hydrocodone 5 mg-acetaminophen 325 mg tablet  TAKE 1 TABLET EVERY 6 HOURS AS NEEDED FOR SEVERE PAIN (IN PLACE OF TRAMADOL DUE TO PROLONGED QT). (Patient not taking: No sig reported)   Not Taking at Unknown time    Inpatient Medications:  . apixaban  5 mg Oral BID  . aspirin  324 mg Oral Once  . DULoxetine  60 mg Oral Daily  . gabapentin  300 mg Oral TID  . pantoprazole  40 mg Oral Daily    Allergies:  Allergies  Allergen Reactions  . Morphine Other (See Comments)    Flushing, rash, itching  . Tape Other (See Comments)    Band aides, adhesive tape Redness and pulls skin off  . Tramadol Other (See Comments)    Pt has prolonged Qtc interval of 540- cannot give tramadol per pharmacy    Social History   Socioeconomic History  . Marital status: Widowed    Spouse name: Not on file  . Number of children: 2  . Years of education: Not on file  . Highest education level: Not on file  Occupational History  . Occupation: retired     Associate Professor: RETIRED  Tobacco Use  . Smoking status: Never Smoker  . Smokeless tobacco: Never Used  Vaping Use  . Vaping Use: Never used  Substance and Sexual Activity  . Alcohol use: Never  . Drug use: Never  . Sexual activity: Not Currently    Comment: 1st intercourse 47 yo-1 partner  Other Topics Concern  .  Not on file  Social History Narrative   Retired    Widowed   Social Determinants of Corporate investment banker Strain: Low Risk   . Difficulty of Paying Living Expenses: Not hard at all  Food Insecurity: No Food Insecurity  . Worried About Programme researcher, broadcasting/film/video in the Last Year: Never true  . Ran Out of Food in the Last Year: Never true  Transportation Needs: No Transportation Needs  . Lack of Transportation (Medical): No  . Lack of Transportation (Non-Medical): No  Physical Activity: Inactive  . Days of Exercise per Week: 0 days  . Minutes of Exercise per Session: 0 min  Stress: Stress Concern Present  . Feeling of Stress : To some extent  Social Connections: Socially Isolated  . Frequency of Communication with Friends and Family: Twice a week  . Frequency of Social Gatherings with Friends and Family: Never  . Attends Religious Services: 1 to 4 times per year  . Active Member of Clubs or Organizations: No  . Attends Banker Meetings: Never  . Marital Status: Widowed  Intimate Partner Violence: Not At Risk  . Fear of Current or Ex-Partner: No  . Emotionally Abused: No  . Physically Abused: No  . Sexually Abused: No     Family History  Problem Relation Age of Onset  . Kidney disease Mother   . Hypertension Mother   . Heart disease Father 52       die of MI at age 72  . Suicidality Son   . Heart disease Brother   . Heart attack Brother   . Colon cancer Neg Hx   . Esophageal cancer Neg Hx   . Pancreatic cancer Neg Hx   . Rectal cancer Neg Hx   . Stomach cancer Neg Hx      Review of Systems: All other systems reviewed and are otherwise  negative except as noted above.  Physical Exam: Vitals:   04/04/21 0735 04/04/21 0747 04/04/21 0748 04/04/21 1137  BP: 134/64   (!) 129/52  Pulse:   72 65  Resp:   18 16  Temp:  98 F (36.7 C)  97.8 F (36.6 C)  TempSrc:    Oral  SpO2:   100% 100%  Weight:      Height:        GEN- The patient is well appearing, alert and oriented x 3 today.   HEENT: normocephalic, atraumatic; sclera clear, conjunctiva pink; hearing intact; oropharynx clear; neck supple Lungs- Clear to ausculation bilaterally, normal work of breathing.  No wheezes, rales, rhonchi Heart- Regular rate and rhythm, no murmurs, rubs or gallops GI- soft, non-tender, non-distended, bowel sounds present Extremities- no clubbing, cyanosis, or edema; DP/PT/radial pulses 2+ bilaterally MS- no significant deformity or atrophy Skin- warm and dry, no rash or lesion Psych- euthymic mood, full affect Neuro- strength and sensation are intact  Labs:   Lab Results  Component Value Date   WBC 10.8 (H) 04/04/2021   HGB 10.8 (L) 04/04/2021   HCT 32.9 (L) 04/04/2021   MCV 97.3 04/04/2021   PLT 233 04/04/2021    Recent Labs  Lab 04/04/21 0318  NA 135  K 4.8  CL 105  CO2 24  BUN 19  CREATININE 1.09*  CALCIUM 8.4*  GLUCOSE 101*      Radiology/Studies: DG Chest Portable 1 View  Result Date: 04/03/2021 CLINICAL DATA:  Chest pain EXAM: PORTABLE CHEST 1 VIEW COMPARISON:  02/14/2021 FINDINGS: Mild hyperinflation. Heart is  normal size. No confluent opacities or effusions. Aortic atherosclerosis. IMPRESSION: Hyperinflation.  No active disease. Electronically Signed   By: Charlett Nose M.D.   On: 04/03/2021 18:33    EKG: on arrival shows WCT at 153 bpm (personally reviewed)  TELEMETRY: shows NSR 60s currently. WCT reviewed alongside Dr. Elberta Fortis. Appears to be atrially driven/SVT, but cannot absolutely rule out VT (personally reviewed)  Assessment/Plan: 1.  Wide Complex Tachycardia -> suspect AF vs SVT with  abberancy History of paroxysmal atrial fibrillation with similar symptoms. CHA2DS2VASC of at least 8 on chronic eliquis Not 1c AAD candidate with h/o CMP QTc has been too long for tikosyn or sotalol consideration in the past even when accounting for her LBBB. Transition to po amiodarone 200 mg BID x 2 weeks then 200 mg daily Restart BB with lopressor 50 mg BID, can consolidate to Toprol 100 mg daily on discharge Nakeda Lebron plan to arrange for EP study with possible SVT/AF ablation based on findings Update Echo pending   For questions or updates, please contact [facility] Please consult [email or web address] for contact info under Cardiology/STEMI.  Signed, Graciella Freer, PA-C  04/04/2021 1:32 PM    I have seen and examined this patient with Otilio Saber.  Agree with above, note added to reflect my findings.  On exam, regular rhythm, no murmurs, lungs clear.  Patient presented to the hospital with a wide-complex tachycardia.  When she is seen via EMS, her heart rate was in the 170s.  Diltiazem was given which brought her heart rates down into the 150s.  She became unstable in the emergency room requiring cardioversion.  She had a second cardioversion in the emergency room but she went back into her tachycardia.  Her carvedilol was held and she was put on IV amiodarone.  In reviewing her telemetry strips, it appears that this is atrial driven and likely some form of SVT.  We Allana Shrestha switch her back to p.o. amiodarone 200 mg twice daily and start her on 50 mg twice daily of metoprolol.  If she does not go back into her SVT tomorrow, would be okay for discharge.  We Brynne Doane arrange for follow-up EP study with possible ablation.    Hutch Rhett M. Karinna Beadles MD 04/04/2021 2:23 PM

## 2021-04-04 NOTE — Consult Note (Addendum)
ELECTROPHYSIOLOGY CONSULT NOTE    Patient ID: Julie Rice MRN: [id number], DOB/AGE: [birth year] 83 y.o.  Admit date: 04/03/2021 Date of Consult: 04/04/2021  Primary Physician: Shirline Frees, NP Primary Cardiologist: Charlton Haws, MD  Electrophysiologist: Dr. Elberta Fortis  Referring Provider: Dr. Jens Som  Patient Profile: Julie Rice is a 85 y.o. female with a hx of  non obstructivecoronary disease, chronic systolic CHF with improved LVEF, fibromyalgia, hypertension, paroxysmal atrial fibrillation chronically anticoagulated on apixaban, TIA, COPD on oxygen, esophageal stricture status post balloon dilatation, large symptomatic hiatal hernia status post fundoplication, chronic dysphagia  who is being seen 04/03/2021 for the evaluation of WCT at the request of Dr. Jens Som  HPI:  Julie Rice is a 85 y.o. female with medical history as above.   Presented to [facility] 04/03/2021 with sudden onset chest pain and palpitations. This lasted about an hour before she called EMS and found to have [facility] 170-180s not responsive to diltiazem. She was unstable with hypotension in the 60s on arrival and was cardioverted urgently at 120J -> 200J with restoration of NSR. She reverted to her WCT within the hour requiring repeat [facility] and IV amiodarone. Her chest pain resolved in NSR, with gradual resolution of her jaw discomfort afertward.  Pertinent labs on admission included AKI with Cr 1.5 (baseline 0.9-1.1), K 5.0, Mg 2.0. TSH WNL. COVID/Flu negative.  Update to echo pending.   Pt states she has had these symptoms before. She had returned from a pulmonary appointment and set down when she noticed sudden onset chest pain and tachypalpitations with discomfort that radiated to her left elbow. She has had similar symptoms with atrial fibrillation, but his was the worse she's ever had it. She had discomfort in the jaw, neck, and back of her head as well.  She also noted visual changes.  Past Medical History:   Diagnosis Date  . A-fib (HCC)   . Anemia   . Anxiety   . Bipolar disorder (HCC)   . Blood transfusion 1991   autologous pts own blood given   . CAD (coronary artery disease)   . Cataract   . Chest pain    "@ rest, lying down, w/exertion"  . CHF (congestive heart failure) (HCC) 07/2020  . Chronic back pain    "mostly lower back but I do have upper back pain regularly" (04/06/2018)  . COPD (chronic obstructive pulmonary disease) (HCC)   . Depression   . Esophageal stricture   . Fibromyalgia   . GERD (gastroesophageal reflux disease)   . Heart murmur    "slight" (04/06/2018)  . Hiatal hernia   . Hyperlipidemia    Patient denies  . Hypertension   . Internal hemorrhoids   . Lumbago   . Mitral regurgitation   . Osteoarthritis   . Osteoporosis   . Pneumonia ~ 01/2018  . PONV (postoperative nausea and vomiting)    severe ponv, "in the past" (04/06/2018)  . Rectal bleeding   . Rheumatoid arthritis (HCC)   . Spondylosis   . TIA (transient ischemic attack) 2013  . Urinary incontinence    wears depends     Surgical History:  Past Surgical History:  Procedure Laterality Date  . ABDOMINAL HYSTERECTOMY  1982  . BACK SURGERY    . BALLOON DILATION N/A 09/21/2018   Procedure: BALLOON DILATION;  Surgeon: Hilarie Fredrickson, MD;  Location: [facility] ENDOSCOPY;  Service: Endoscopy;  Laterality: N/A;  . BALLOON DILATION N/A 08/12/2019   Procedure: BALLOON DILATION;  Surgeon: Chales Abrahams,  Filbert Berthold, MD;  Location: [facility] ENDOSCOPY;  Service: Endoscopy;  Laterality: N/A;  . BIOPSY  08/12/2019   Procedure: BIOPSY;  Surgeon: Lynann Bologna, MD;  Location: [facility] ENDOSCOPY;  Service: Endoscopy;;  . BLADDER SUSPENSION  1980's  . BOTOX INJECTION N/A 09/21/2018   Procedure: BOTOX INJECTION;  Surgeon: Hilarie Fredrickson, MD;  Location: [facility] ENDOSCOPY;  Service: Endoscopy;  Laterality: N/A;  . BOTOX INJECTION N/A 08/12/2019   Procedure: BOTOX INJECTION;  Surgeon: Lynann Bologna, MD;  Location: [facility] ENDOSCOPY;  Service: Endoscopy;   Laterality: N/A;  . CATARACT EXTRACTION W/ INTRAOCULAR LENS  IMPLANT, BILATERAL Bilateral 2010  . DILATION AND CURETTAGE OF UTERUS  1961  . ESOPHAGEAL MANOMETRY N/A 03/25/2018   Procedure: ESOPHAGEAL MANOMETRY (EM);  Surgeon: Napoleon Form, MD;  Location: [facility] ENDOSCOPY;  Service: Endoscopy;  Laterality: N/A;  . ESOPHAGOGASTRODUODENOSCOPY (EGD) WITH PROPOFOL N/A 04/07/2018   Procedure: ESOPHAGOGASTRODUODENOSCOPY (EGD) WITH PROPOFOL;  Surgeon: Sherrilyn Rist, MD;  Location: [facility] ENDOSCOPY;  Service: Gastroenterology;  Laterality: N/A;  . ESOPHAGOGASTRODUODENOSCOPY (EGD) WITH PROPOFOL N/A 09/21/2018   Procedure: ESOPHAGOGASTRODUODENOSCOPY (EGD) WITH PROPOFOL;  Surgeon: Hilarie Fredrickson, MD;  Location: [facility] ENDOSCOPY;  Service: Endoscopy;  Laterality: N/A;  . ESOPHAGOGASTRODUODENOSCOPY (EGD) WITH PROPOFOL N/A 08/12/2019   Procedure: ESOPHAGOGASTRODUODENOSCOPY (EGD) WITH PROPOFOL;  Surgeon: Lynann Bologna, MD;  Location: [facility] ENDOSCOPY;  Service: Endoscopy;  Laterality: N/A;  . HERNIA REPAIR    . HIATAL HERNIA REPAIR N/A 08/02/2016   Procedure: LAPAROSCOPIC REPAIR OF LARGE  HIATAL HERNIA;  Surgeon: Glenna Fellows, MD;  Location: [facility] ORS;  Service: General;  Laterality: N/A;  . JOINT REPLACEMENT    . LAPAROSCOPIC NISSEN FUNDOPLICATION N/A 08/02/2016   Procedure: LAPAROSCOPIC NISSEN FUNDOPLICATION;  Surgeon: Glenna Fellows, MD;  Location: [facility] ORS;  Service: General;  Laterality: N/A;  . LUMBAR LAMINECTOMY  1990; 1994; [id number]   "I've got 2 stainless steel rods; 6 screws; 2 ray cages"took bone from right hip to put in back  . RIGHT/LEFT HEART CATH AND CORONARY ANGIOGRAPHY N/A 07/26/2019   Procedure: RIGHT/LEFT HEART CATH AND CORONARY ANGIOGRAPHY;  Surgeon: Lyn Records, MD;  Location: [facility] INVASIVE CV LAB;  Service: Cardiovascular;  Laterality: N/A;  . TEE WITHOUT CARDIOVERSION N/A 08/19/2019   Procedure: TRANSESOPHAGEAL ECHOCARDIOGRAM (TEE);  Surgeon: Lewayne Bunting, MD;  Location: [facility] ENDOSCOPY;   Service: Cardiovascular;  Laterality: N/A;  . TONSILLECTOMY AND ADENOIDECTOMY  1945  . TOTAL KNEE ARTHROPLASTY Right ~ 1996  . TUBAL LIGATION  ~ 1976     Medications Prior to Admission  Medication Sig Dispense Refill Last Dose  . acetaminophen (TYLENOL) 500 MG tablet Take 1,000 mg by mouth every 6 (six) hours as needed for moderate pain, headache or fever.   04/03/2021 at Unknown time  . albuterol (VENTOLIN HFA) 108 (90 Base) MCG/ACT inhaler TAKE 2 PUFFS BY MOUTH EVERY 6 HOURS AS NEEDED FOR WHEEZE OR SHORTNESS OF BREATH (Patient taking differently: Inhale 2 puffs into the lungs every 6 (six) hours as needed for wheezing or shortness of breath.) 8 g 2 04/03/2021 at Unknown time  . amiodarone (PACERONE) 200 MG tablet Take 1 tablet (200 mg total) by mouth daily. 90 tablet 3 04/03/2021 at Unknown time  . amLODipine (NORVASC) 2.5 MG tablet Take 2.5 mg by mouth daily.   04/03/2021 at Unknown time  . apixaban (ELIQUIS) 5 MG TABS tablet Take 1 tablet (5 mg total) by mouth 2 (two) times daily. 180 tablet 1 04/03/2021 at 0800  . Carboxymethylcellulose Sodium (REFRESH TEARS OP) Place  1 drop into both eyes at bedtime.   04/02/2021 at Unknown time  . carvedilol (COREG) 6.25 MG tablet Take 1 tablet (6.25 mg total) by mouth 2 (two) times daily with a meal. 180 tablet 3 04/03/2021 at 0500  . cetirizine (ZYRTEC) 10 MG tablet Take 10 mg by mouth daily as needed for allergies.   unk  . clonazePAM (KLONOPIN) 0.5 MG tablet TAKE 1 TABLET BY MOUTH TWICE A DAY AS NEEDED FOR ANXIETY (Patient taking differently: Take 0.5 mg by mouth 2 (two) times daily as needed for anxiety.) 60 tablet 2 04/03/2021 at Unknown time  . DULoxetine (CYMBALTA) 60 MG capsule TAKE 1 CAPSULE BY MOUTH EVERY DAY (Patient taking differently: Take 60 mg by mouth daily.) 90 capsule 3 04/03/2021 at Unknown time  . ENTRESTO 97-103 MG TAKE 1 TABLET BY MOUTH TWICE A DAY (Patient taking differently: Take 1 tablet by mouth 2 (two) times daily.) 180 tablet 3 04/03/2021  at Unknown time  . furosemide (LASIX) 40 MG tablet TAKE 1 TABLET (40 MG TOTAL) BY MOUTH DAILY AS NEEDED FOR FLUID OR EDEMA. (Patient taking differently: Take 40 mg by mouth daily as needed for fluid.) 90 tablet 3 04/02/2021 at Unknown time  . gabapentin (NEURONTIN) 300 MG capsule Take 1 capsule (300 mg total) by mouth 3 (three) times daily. 270 capsule 1   . Multiple Vitamins-Minerals (ONE-A-DAY WOMENS 50+) TABS Take 1 tablet by mouth daily.   04/03/2021 at Unknown time  . omeprazole (PRILOSEC) 20 MG capsule TAKE 1 CAPSULE BY MOUTH EVERY DAY (Patient taking differently: Take 20 mg by mouth daily.) 90 capsule 3 04/03/2021 at Unknown time  . spironolactone (ALDACTONE) 25 MG tablet Take 1 tablet (25 mg total) by mouth daily. 90 tablet 2 04/03/2021 at Unknown time  . gabapentin (NEURONTIN) 100 MG capsule Take 100 mg by mouth 3 (three) times daily. (Patient not taking: No sig reported)   Not Taking at Unknown time  . HYDROcodone-acetaminophen (NORCO/VICODIN) 5-325 MG tablet hydrocodone 5 mg-acetaminophen 325 mg tablet  TAKE 1 TABLET EVERY 6 HOURS AS NEEDED FOR SEVERE PAIN (IN PLACE OF TRAMADOL DUE TO PROLONGED QT). (Patient not taking: No sig reported)   Not Taking at Unknown time    Inpatient Medications:  . apixaban  5 mg Oral BID  . aspirin  324 mg Oral Once  . DULoxetine  60 mg Oral Daily  . gabapentin  300 mg Oral TID  . pantoprazole  40 mg Oral Daily    Allergies:  Allergies  Allergen Reactions  . Morphine Other (See Comments)    Flushing, rash, itching  . Tape Other (See Comments)    Band aides, adhesive tape Redness and pulls skin off  . Tramadol Other (See Comments)    Pt has prolonged Qtc interval of 540- cannot give tramadol per pharmacy    Social History   Socioeconomic History  . Marital status: Widowed    Spouse name: Not on file  . Number of children: 2  . Years of education: Not on file  . Highest education level: Not on file  Occupational History  . Occupation: retired     Associate Professor: RETIRED  Tobacco Use  . Smoking status: Never Smoker  . Smokeless tobacco: Never Used  Vaping Use  . Vaping Use: Never used  Substance and Sexual Activity  . Alcohol use: Never  . Drug use: Never  . Sexual activity: Not Currently    Comment: 1st intercourse 47 yo-1 partner  Other Topics Concern  .  Not on file  Social History Narrative   Retired    Widowed   Social Determinants of Corporate investment banker Strain: Low Risk   . Difficulty of Paying Living Expenses: Not hard at all  Food Insecurity: No Food Insecurity  . Worried About Programme researcher, broadcasting/film/video in the Last Year: Never true  . Ran Out of Food in the Last Year: Never true  Transportation Needs: No Transportation Needs  . Lack of Transportation (Medical): No  . Lack of Transportation (Non-Medical): No  Physical Activity: Inactive  . Days of Exercise per Week: 0 days  . Minutes of Exercise per Session: 0 min  Stress: Stress Concern Present  . Feeling of Stress : To some extent  Social Connections: Socially Isolated  . Frequency of Communication with Friends and Family: Twice a week  . Frequency of Social Gatherings with Friends and Family: Never  . Attends Religious Services: 1 to 4 times per year  . Active Member of Clubs or Organizations: No  . Attends Banker Meetings: Never  . Marital Status: Widowed  Intimate Partner Violence: Not At Risk  . Fear of Current or Ex-Partner: No  . Emotionally Abused: No  . Physically Abused: No  . Sexually Abused: No     Family History  Problem Relation Age of Onset  . Kidney disease Mother   . Hypertension Mother   . Heart disease Father 52       die of MI at age 72  . Suicidality Son   . Heart disease Brother   . Heart attack Brother   . Colon cancer Neg Hx   . Esophageal cancer Neg Hx   . Pancreatic cancer Neg Hx   . Rectal cancer Neg Hx   . Stomach cancer Neg Hx      Review of Systems: All other systems reviewed and are otherwise  negative except as noted above.  Physical Exam: Vitals:   04/04/21 0735 04/04/21 0747 04/04/21 0748 04/04/21 1137  BP: 134/64   (!) 129/52  Pulse:   72 65  Resp:   18 16  Temp:  98 F (36.7 C)  97.8 F (36.6 C)  TempSrc:    Oral  SpO2:   100% 100%  Weight:      Height:        GEN- The patient is well appearing, alert and oriented x 3 today.   HEENT: normocephalic, atraumatic; sclera clear, conjunctiva pink; hearing intact; oropharynx clear; neck supple Lungs- Clear to ausculation bilaterally, normal work of breathing.  No wheezes, rales, rhonchi Heart- Regular rate and rhythm, no murmurs, rubs or gallops GI- soft, non-tender, non-distended, bowel sounds present Extremities- no clubbing, cyanosis, or edema; DP/PT/radial pulses 2+ bilaterally MS- no significant deformity or atrophy Skin- warm and dry, no rash or lesion Psych- euthymic mood, full affect Neuro- strength and sensation are intact  Labs:   Lab Results  Component Value Date   WBC 10.8 (H) 04/04/2021   HGB 10.8 (L) 04/04/2021   HCT 32.9 (L) 04/04/2021   MCV 97.3 04/04/2021   PLT 233 04/04/2021    Recent Labs  Lab 04/04/21 0318  NA 135  K 4.8  CL 105  CO2 24  BUN 19  CREATININE 1.09*  CALCIUM 8.4*  GLUCOSE 101*      Radiology/Studies: DG Chest Portable 1 View  Result Date: 04/03/2021 CLINICAL DATA:  Chest pain EXAM: PORTABLE CHEST 1 VIEW COMPARISON:  02/14/2021 FINDINGS: Mild hyperinflation. Heart is  normal size. No confluent opacities or effusions. Aortic atherosclerosis. IMPRESSION: Hyperinflation.  No active disease. Electronically Signed   By: Charlett Nose M.D.   On: 04/03/2021 18:33    EKG: on arrival shows WCT at 153 bpm (personally reviewed)  TELEMETRY: shows NSR 60s currently. WCT reviewed alongside Dr. Elberta Fortis. Appears to be atrially driven/SVT, but cannot absolutely rule out VT (personally reviewed)  Assessment/Plan: 1.  Wide Complex Tachycardia -> suspect AF vs SVT with  abberancy History of paroxysmal atrial fibrillation with similar symptoms. CHA2DS2VASC of at least 8 on chronic eliquis Not 1c AAD candidate with h/o CMP QTc has been too long for tikosyn or sotalol consideration in the past even when accounting for her LBBB. Transition to po amiodarone 200 mg BID x 2 weeks then 200 mg daily Restart BB with lopressor 50 mg BID, can consolidate to Toprol 100 mg daily on discharge Julie Rice plan to arrange for EP study with possible SVT/AF ablation based on findings Update Echo pending   For questions or updates, please contact [facility] Please consult [email or web address] for contact info under Cardiology/STEMI.  Signed, Graciella Freer, PA-C  04/04/2021 1:32 PM    I have seen and examined this patient with Otilio Saber.  Agree with above, note added to reflect my findings.  On exam, regular rhythm, no murmurs, lungs clear.  Patient presented to the hospital with a wide-complex tachycardia.  When she is seen via EMS, her heart rate was in the 170s.  Diltiazem was given which brought her heart rates down into the 150s.  She became unstable in the emergency room requiring cardioversion.  She had a second cardioversion in the emergency room but she went back into her tachycardia.  Her carvedilol was held and she was put on IV amiodarone.  In reviewing her telemetry strips, it appears that this is atrial driven and likely some form of SVT.  Julie Julie Rice switch her back to p.o. amiodarone 200 mg twice daily and start her on 50 mg twice daily of metoprolol.  If she does not go back into her SVT tomorrow, would be okay for discharge.  Julie Rice arrange for follow-up EP study with possible ablation.    Gavyn Ybarra M. Jaquise Faux MD 04/04/2021 2:23 PM

## 2021-04-05 ENCOUNTER — Other Ambulatory Visit (HOSPITAL_COMMUNITY): Payer: Self-pay

## 2021-04-05 LAB — BASIC METABOLIC PANEL
Anion gap: 7 (ref 5–15)
BUN: 14 mg/dL (ref 8–23)
CO2: 25 mmol/L (ref 22–32)
Calcium: 8.7 mg/dL — ABNORMAL LOW (ref 8.9–10.3)
Chloride: 103 mmol/L (ref 98–111)
Creatinine, Ser: 0.88 mg/dL (ref 0.44–1.00)
GFR, Estimated: >60 mL/min (ref >60)
Glucose, Bld: 93 mg/dL (ref 70–99)
Potassium: 4.7 mmol/L (ref 3.5–5.1)
Sodium: 135 mmol/L (ref 135–145)

## 2021-04-05 LAB — CBC
HCT: 34.1 % — ABNORMAL LOW (ref 36.0–46.0)
Hemoglobin: 10.8 g/dL — ABNORMAL LOW (ref 12.0–15.0)
MCH: 31.5 pg (ref 26.0–34.0)
MCHC: 31.7 g/dL (ref 30.0–36.0)
MCV: 99.4 fL (ref 80.0–100.0)
Platelets: 234 10*3/uL (ref 150–400)
RBC: 3.43 MIL/uL — ABNORMAL LOW (ref 3.87–5.11)
RDW: 13.2 % (ref 11.5–15.5)
WBC: 9.4 10*3/uL (ref 4.0–10.5)
nRBC: 0 % (ref 0.0–0.2)

## 2021-04-05 MED ORDER — AMIODARONE HCL 200 MG PO TABS
200.0000 mg | ORAL_TABLET | Freq: Every day | ORAL | 3 refills | Status: DC
  Filled 2021-04-05: qty 42, 30d supply, fill #0

## 2021-04-05 MED ORDER — METOPROLOL TARTRATE 50 MG PO TABS
50.0000 mg | ORAL_TABLET | Freq: Two times a day (BID) | ORAL | 11 refills | Status: DC
  Filled 2021-04-05: qty 60, 30d supply, fill #0

## 2021-04-05 MED ORDER — SACUBITRIL-VALSARTAN 24-26 MG PO TABS: 1.0000 | ORAL_TABLET | Freq: Two times a day (BID) | ORAL | Status: DC

## 2021-04-05 NOTE — Discharge Summary (Signed)
Discharge Summary    Patient ID: Julie Rice MRN: [id number]; DOB: Nov 28, 1935  Admit date: 04/03/2021 Discharge date: 04/05/2021  PCP:  Shirline Frees, NP   [facility] Providers Cardiologist:  Charlton Haws, MD      Discharge Diagnoses    Active Problems:   Wide-complex tachycardia [facility])    Diagnostic Studies/Procedures    ECHO: 04/04/2021 1. Left ventricular ejection fraction, by estimation, is 60 to 65%. The left ventricle has normal function. Left ventricular endocardial border not optimally defined to evaluate regional wall motion. There is mild left ventricular hypertrophy. Left ventricular diastolic parameters are consistent with Grade I diastolic dysfunction (impaired relaxation).  2. Right ventricular systolic function is normal. The right ventricular  size is normal. There is normal pulmonary artery systolic pressure. The estimated right ventricular systolic pressure is 29.0 mmHg.  3. The mitral valve is degenerative. Trivial mitral valve regurgitation. No evidence of mitral stenosis. Moderate mitral annular calcification.  4. The aortic valve is normal in structure. There is mild calcification of the aortic valve. Aortic valve regurgitation is mild. No aortic stenosis is present.  5. The inferior vena cava is normal in size with greater than 50%  respiratory variability, suggesting right atrial pressure of 3 mmHg.  _____________   History of Present Illness     Julie Rice is a 85 y.o. female with PMH mild nonobstructive coronary disease by catheterization in 2020, paroxysmal atrial fibrillation, COPD, previous TIA admitted with wide-complex tachycardia.  Patient developed sudden onset palpitations with substernal chest burning on 05/31 similar to her previous symptoms with atrial fibrillation.   She was in [facility] and called EMS.  They gave her Cardizem 10 mg IV as well as sublingual nitro for the chest pain and aspirin 324 mg.  Her heart rate decreased into  the 150s after the meds.  However, in the emergency room, she was complaining of increasing pain in her chest, jaw and left arm.  She was hemodynamically unstable and was cardioverted a total of 3 times in the emergency room.  She was admitted to stepdown.  Hospital Course     Consultants: EP  She was started on IV amiodarone.  Her echocardiogram was repeated to assess LV function, it has normalized.  Her rhythm was felt to be atrial flutter with aberrancy versus SVT.  An EP consult was called.  Dr Elberta Fortis saw, unclear if this is AFlutter vs SVT with abberancy but Dr Elberta Fortis feels SVT w/ aberrancy more likely. He reviewed possible meds >> she is to be on amio 200 mg bid x 2 weeks, then 200 mg qd starting 06/15. He started metoprolol. He will arrange EP study w/ possible SVT/AF ablation as an outpt.  Her troponin was mildly elevated after cardioversion with a peak of 33.  Chest pain resolved with the cardioversion and was felt to be secondary to her elevated heart rate.  She has a history of nonobstructive disease at cath in 2020.  Dr. Jens Som reviewed the data and felt that no further ischemic evaluation was indicated.  She has a history of cardiomyopathy and was on Entresto, Aldactone, Lasix as needed and amlodipine.  These meds were all held on admission. Her BB was changed from Coreg to metoprolol. She will not be on her other CHF meds for now. Consideration can be given to restarting them as an outpatient.  Of note, her Sherryll Burger price has increased to $800 per month and she is unable to afford this.  She had been  on Coreg 6.25 mg twice daily prior to admission.  This was discontinued and she was started on metoprolol 50 mg twice daily.  Her blood pressure and heart rate are currently tolerating both the increased beta-blocker.  Her heart rate occasionally drops into the 50s.  She is asymptomatic with this.  On 04/05/2021, she was seen by Dr. Jens Som and all data were reviewed.  She had no  further recurrence of the WCT on the amiodarone.  Her chest pain had resolved and she was ambulating without chest pain or shortness of breath.  No further inpatient work-up is indicated and she is considered stable for discharge, to follow-up as an outpatient.   Did the patient have an acute coronary syndrome (MI, NSTEMI, STEMI, etc) this admission?:  No.   The elevated Troponin was due to the acute medical illness (demand ischemia).   _____________  Discharge Vitals Blood pressure (!) 119/52, pulse 67, temperature 97.6 F (36.4 C), temperature source Oral, resp. rate 18, height 5\' 1"  (1.549 m), weight 74 kg, SpO2 97 %.  Filed Weights   04/03/21 2049 04/04/21 0100 04/05/21 0456  Weight: 72.1 kg 74 kg 74 kg    Labs & Radiologic Studies    CBC Recent Labs    04/04/21 0318 04/05/21 0347  WBC 10.8* 9.4  HGB 10.8* 10.8*  HCT 32.9* 34.1*  MCV 97.3 99.4  PLT 233 234   Basic Metabolic Panel Recent Labs    06/05/21 0318 04/05/21 0347  NA 135 135  K 4.8 4.7  CL 105 103  CO2 24 25  GLUCOSE 101* 93  BUN 19 14  CREATININE 1.09* 0.88  CALCIUM 8.4* 8.7*  MG 2.0  --    Liver Function Tests No results for input(s): AST, ALT, ALKPHOS, BILITOT, PROT, ALBUMIN in the last 72 hours. No results for input(s): LIPASE, AMYLASE in the last 72 hours. High Sensitivity Troponin:   Recent Labs  Lab 04/03/21 1840 04/03/21 2026 04/04/21 0318  TROPONINIHS 7 18* 33*    BNP Invalid input(s): POCBNP D-Dimer No results for input(s): DDIMER in the last 72 hours. Hemoglobin A1C No results for input(s): HGBA1C in the last 72 hours. Fasting Lipid Panel No results for input(s): CHOL, HDL, LDLCALC, TRIG, CHOLHDL, LDLDIRECT in the last 72 hours. Thyroid Function Tests Recent Labs    04/04/21 0318  TSH 1.588   _____________  DG Chest Portable 1 View  Result Date: 04/03/2021 CLINICAL DATA:  Chest pain EXAM: PORTABLE CHEST 1 VIEW COMPARISON:  02/14/2021 FINDINGS: Mild hyperinflation. Heart  is normal size. No confluent opacities or effusions. Aortic atherosclerosis. IMPRESSION: Hyperinflation.  No active disease. Electronically Signed   By: 02/16/2021 M.D.   On: 04/03/2021 18:33   ECHOCARDIOGRAM COMPLETE  Result Date: 04/04/2021    ECHOCARDIOGRAM REPORT   Patient Name:   Julie Rice Gambrill Date of Exam: 04/04/2021 Medical Rec #:  06/04/2021        Height:       61.0 in Accession #:    [id number]       Weight:       163.1 lb Date of Birth:  [birth year]         BSA:          1.732 m Patient Age:    85 years         BP:           113/59 mmHg Patient Gender: F  HR:           64 bpm. Exam Location:  Inpatient Procedure: 2D Echo, Color Doppler and Cardiac Doppler Indications:    I47.2 Ventricular tachycardia  History:        Patient has prior history of Echocardiogram examinations, most                 recent 01/12/2020. CHF, CAD, COPD, Arrythmias:Atrial                 Fibrillation; Risk Factors:Hypertension and Dyslipidemia.  Sonographer:    Irving Burton Senior RDCS Referring Phys: [phone] BRIAN S CRENSHAW  Sonographer Comments: Poor apical windows due to lung interference. Wall motion obtained from short axis. IMPRESSIONS  1. Left ventricular ejection fraction, by estimation, is 60 to 65%. The left ventricle has normal function. Left ventricular endocardial border not optimally defined to evaluate regional wall motion. There is mild left ventricular hypertrophy. Left ventricular diastolic parameters are consistent with Grade I diastolic dysfunction (impaired relaxation).  2. Right ventricular systolic function is normal. The right ventricular size is normal. There is normal pulmonary artery systolic pressure. The estimated right ventricular systolic pressure is 29.0 mmHg.  3. The mitral valve is degenerative. Trivial mitral valve regurgitation. No evidence of mitral stenosis. Moderate mitral annular calcification.  4. The aortic valve is normal in structure. There is mild calcification of the aortic  valve. Aortic valve regurgitation is mild. No aortic stenosis is present.  5. The inferior vena cava is normal in size with greater than 50% respiratory variability, suggesting right atrial pressure of 3 mmHg. FINDINGS  Left Ventricle: Left ventricular ejection fraction, by estimation, is 60 to 65%. The left ventricle has normal function. Left ventricular endocardial border not optimally defined to evaluate regional wall motion. The left ventricular internal cavity size was normal in size. There is mild left ventricular hypertrophy. Left ventricular diastolic parameters are consistent with Grade I diastolic dysfunction (impaired relaxation). Right Ventricle: The right ventricular size is normal. No increase in right ventricular wall thickness. Right ventricular systolic function is normal. There is normal pulmonary artery systolic pressure. The tricuspid regurgitant velocity is 2.55 m/s, and  with an assumed right atrial pressure of 3 mmHg, the estimated right ventricular systolic pressure is 29.0 mmHg. Left Atrium: Left atrial size was normal in size. Right Atrium: Right atrial size was normal in size. Pericardium: There is no evidence of pericardial effusion. Mitral Valve: The mitral valve is degenerative in appearance. Moderate mitral annular calcification. Trivial mitral valve regurgitation. No evidence of mitral valve stenosis. Tricuspid Valve: The tricuspid valve is normal in structure. Tricuspid valve regurgitation is mild . No evidence of tricuspid stenosis. Aortic Valve: The aortic valve is normal in structure. There is mild calcification of the aortic valve. Aortic valve regurgitation is mild. No aortic stenosis is present. Pulmonic Valve: The pulmonic valve was normal in structure. Pulmonic valve regurgitation is trivial. No evidence of pulmonic stenosis. Aorta: The aortic root is normal in size and structure. Venous: The inferior vena cava is normal in size with greater than 50% respiratory variability,  suggesting right atrial pressure of 3 mmHg. IAS/Shunts: No atrial level shunt detected by color flow Doppler.  LEFT VENTRICLE PLAX 2D LVIDd:         3.70 cm  Diastology LVIDs:         1.90 cm  LV e' medial:    5.00 cm/s LV PW:         1.10 cm  LV E/e'  medial:  19.8 LV IVS:        1.10 cm  LV e' lateral:   4.46 cm/s LVOT diam:     1.70 cm  LV E/e' lateral: 22.2 LV SV:         57 LV SV Index:   33 LVOT Area:     2.27 cm  RIGHT VENTRICLE RV S prime:     17.70 cm/s TAPSE (M-mode): 2.7 cm LEFT ATRIUM             Index       RIGHT ATRIUM           Index LA diam:        3.40 cm 1.96 cm/m  RA Area:     12.30 cm LA Vol (A2C):   40.3 ml 23.27 ml/m RA Volume:   24.90 ml  14.38 ml/m LA Vol (A4C):   53.5 ml 30.89 ml/m LA Biplane Vol: 49.5 ml 28.58 ml/m  AORTIC VALVE LVOT Vmax:         112.00 cm/s LVOT Vmean:        84.800 cm/s LVOT VTI:          0.253 m AR Vena Contracta: 0.30 cm  AORTA Ao Root diam: 2.90 cm Ao Asc diam:  3.40 cm MITRAL VALVE                TRICUSPID VALVE MV Area (PHT): 2.42 cm     TR Peak grad:   26.0 mmHg MV Decel Time: 313 msec     TR Vmax:        255.00 cm/s MV E velocity: 98.80 cm/s MV A velocity: 112.00 cm/s  SHUNTS MV E/A ratio:  0.88         Systemic VTI:  0.25 m                             Systemic Diam: 1.70 cm Weston Brass MD Electronically signed by Weston Brass MD Signature Date/Time: 04/04/2021/10:29:03 PM    Final    Disposition   Pt is being discharged home today in good condition.  Follow-up Plans & Appointments     Discharge Instructions    Diet - low sodium heart healthy   Complete by: As directed    Increase activity slowly   Complete by: As directed       Discharge Medications   Allergies as of 04/05/2021      Reactions   Morphine Other (See Comments)   Flushing, rash, itching   Tape Other (See Comments)   Band aides, adhesive tape Redness and pulls skin off   Tramadol Other (See Comments)   Pt has prolonged Qtc interval of 540- cannot give tramadol per  pharmacy      Medication List    STOP taking these medications   amLODipine 2.5 MG tablet Commonly known as: NORVASC   carvedilol 6.25 MG tablet Commonly known as: COREG   Entresto 97-103 MG Generic drug: sacubitril-valsartan   spironolactone 25 MG tablet Commonly known as: ALDACTONE     TAKE these medications   acetaminophen 500 MG tablet Commonly known as: TYLENOL Take 1,000 mg by mouth every 6 (six) hours as needed for moderate pain, headache or fever.   albuterol 108 (90 Base) MCG/ACT inhaler Commonly known as: VENTOLIN HFA TAKE 2 PUFFS BY MOUTH EVERY 6 HOURS AS NEEDED FOR WHEEZE OR SHORTNESS OF BREATH What changed:   how much  to take  how to take this  when to take this  reasons to take this  additional instructions   amiodarone 200 MG tablet Commonly known as: PACERONE Take 1 tablet (200 mg total) by mouth daily. Take 1 tablet 2 x daily for 2 weeks, then 1 tablet daily. What changed: additional instructions   apixaban 5 MG Tabs tablet Commonly known as: ELIQUIS Take 1 tablet (5 mg total) by mouth 2 (two) times daily.   cetirizine 10 MG tablet Commonly known as: ZYRTEC Take 10 mg by mouth daily as needed for allergies.   clonazePAM 0.5 MG tablet Commonly known as: KLONOPIN TAKE 1 TABLET BY MOUTH TWICE A DAY AS NEEDED FOR ANXIETY What changed: See the new instructions.   DULoxetine 60 MG capsule Commonly known as: CYMBALTA TAKE 1 CAPSULE BY MOUTH EVERY DAY What changed: how much to take   furosemide 40 MG tablet Commonly known as: LASIX TAKE 1 TABLET (40 MG TOTAL) BY MOUTH DAILY AS NEEDED FOR FLUID OR EDEMA. What changed: See the new instructions.   gabapentin 100 MG capsule Commonly known as: NEURONTIN Take 100 mg by mouth 3 (three) times daily.   gabapentin 300 MG capsule Commonly known as: NEURONTIN Take 1 capsule (300 mg total) by mouth 3 (three) times daily.   HYDROcodone-acetaminophen 5-325 MG tablet Commonly known as:  NORCO/VICODIN hydrocodone 5 mg-acetaminophen 325 mg tablet  TAKE 1 TABLET EVERY 6 HOURS AS NEEDED FOR SEVERE PAIN (IN PLACE OF TRAMADOL DUE TO PROLONGED QT).   metoprolol tartrate 50 MG tablet Commonly known as: LOPRESSOR Take 1 tablet (50 mg total) by mouth 2 (two) times daily.   omeprazole 20 MG capsule Commonly known as: PRILOSEC TAKE 1 CAPSULE BY MOUTH EVERY DAY What changed: how much to take   One-A-Day Womens 50+ Tabs Take 1 tablet by mouth daily.   REFRESH TEARS OP Place 1 drop into both eyes at bedtime.          Outstanding Labs/Studies   None  Duration of Discharge Encounter   Greater than 30 minutes including physician time.  Signed, Theodore Demark, PA-C 04/05/2021, 11:39 AM

## 2021-04-05 NOTE — Progress Notes (Addendum)
Progress Note  Patient Name: Julie Rice Date of Encounter: 04/05/2021  [facility] Cardiologist: Charlton Haws, MD   Subjective   No CP or SOB  Inpatient Medications    Scheduled Meds: . amiodarone  200 mg Oral BID  . apixaban  5 mg Oral BID  . aspirin  324 mg Oral Once  . DULoxetine  60 mg Oral Daily  . gabapentin  300 mg Oral TID  . metoprolol tartrate  50 mg Oral BID  . pantoprazole  40 mg Oral Daily   Continuous Infusions:  PRN Meds: acetaminophen, clonazePAM, nitroGLYCERIN, ondansetron (ZOFRAN) IV   Vital Signs    Vitals:   04/05/21 0400 04/05/21 0456 04/05/21 0500 04/05/21 0834  BP:  (!) 130/57  (!) 119/52  Pulse:  66  67  Resp: Temp:  97.6 F (36.4 C)  97.6 F (36.4 C)  TempSrc:  Oral  Oral  SpO2: 96% 100% 92% 97%  Weight:  74 kg    Height:        Intake/Output Summary (Last 24 hours) at 04/05/2021 1012 Last data filed at 04/05/2021 0500 Gross per 24 hour  Intake 637.65 ml  Output 800 ml  Net -162.35 ml   Last 3 Weights 04/05/2021 04/04/2021 04/03/2021  Weight (lbs) 163 lb 3.2 oz 163 lb 1.6 oz 159 lb  Weight (kg) 74.027 kg 73.982 kg 72.122 kg      Telemetry    SR, mild sinus brady high 50s, PVCs - Personally Reviewed  Physical Exam   GEN: No acute distress.   Neck: No JVD Cardiac: RRR, no murmur, no rubs, or gallops.  Respiratory: clear bilaterally  GI: Soft, nontender, non-distended  MS: No edema; No deformity. Neuro:  Nonfocal  Psych: Normal affect   Labs    High Sensitivity Troponin:   Recent Labs  Lab 04/03/21 1840 04/03/21 2026 04/04/21 0318  TROPONINIHS 7 18* 33*      Chemistry Recent Labs  Lab 04/03/21 1840 04/04/21 0318 04/05/21 0347  NA 133* 135 135  K 4.7 4.8 4.7  CL 100 105 103  CO2 GLUCOSE 132* 101* 93  BUN 25* 19 14  CREATININE 1.54* 1.09* 0.88  CALCIUM 8.8* 8.4* 8.7*  GFRNONAA 33* 50* >60  ANIONGAP Hematology Recent Labs  Lab 04/03/21 1840 04/04/21 0318  04/05/21 0347  WBC 9.6 10.8* 9.4  RBC 3.69* 3.38* 3.43*  HGB 11.6* 10.8* 10.8*  HCT 36.2 32.9* 34.1*  MCV 98.1 97.3 99.4  MCH 31.4 32.0 31.5  MCHC 32.0 32.8 31.7  RDW 13.1 13.2 13.2  PLT 297 233 234    Radiology    DG Chest Portable 1 View  Result Date: 04/03/2021 CLINICAL DATA:  Chest pain EXAM: PORTABLE CHEST 1 VIEW COMPARISON:  02/14/2021 FINDINGS: Mild hyperinflation. Heart is normal size. No confluent opacities or effusions. Aortic atherosclerosis. IMPRESSION: Hyperinflation.  No active disease. Electronically Signed   By: Charlett Nose M.D.   On: 04/03/2021 18:33   ECHOCARDIOGRAM COMPLETE  Result Date: 04/04/2021    ECHOCARDIOGRAM REPORT   Patient Name:   [name] Date of Exam: 04/04/2021 Medical Rec #:  [id number]        Height:       61.0 in Accession #:    [id number]       Weight:       163.1 lb Date of Birth:  10/31/1936  BSA:          1.732 m Patient Age:    85 years         BP:           113/59 mmHg Patient Gender: F                HR:           64 bpm. Exam Location:  Inpatient Procedure: 2D Echo, Color Doppler and Cardiac Doppler Indications:    I47.2 Ventricular tachycardia  History:        Patient has prior history of Echocardiogram examinations, most                 recent 01/12/2020. CHF, CAD, COPD, Arrythmias:Atrial                 Fibrillation; Risk Factors:Hypertension and Dyslipidemia.  Sonographer:    Irving Burton Senior RDCS Referring Phys: [phone] Quintan Saldivar S Koray Soter  Sonographer Comments: Poor apical windows due to lung interference. Wall motion obtained from short axis. IMPRESSIONS  1. Left ventricular ejection fraction, by estimation, is 60 to 65%. The left ventricle has normal function. Left ventricular endocardial border not optimally defined to evaluate regional wall motion. There is mild left ventricular hypertrophy. Left ventricular diastolic parameters are consistent with Grade I diastolic dysfunction (impaired relaxation).  2. Right ventricular systolic function is  normal. The right ventricular size is normal. There is normal pulmonary artery systolic pressure. The estimated right ventricular systolic pressure is 29.0 mmHg.  3. The mitral valve is degenerative. Trivial mitral valve regurgitation. No evidence of mitral stenosis. Moderate mitral annular calcification.  4. The aortic valve is normal in structure. There is mild calcification of the aortic valve. Aortic valve regurgitation is mild. No aortic stenosis is present.  5. The inferior vena cava is normal in size with greater than 50% respiratory variability, suggesting right atrial pressure of 3 mmHg. FINDINGS  Left Ventricle: Left ventricular ejection fraction, by estimation, is 60 to 65%. The left ventricle has normal function. Left ventricular endocardial border not optimally defined to evaluate regional wall motion. The left ventricular internal cavity size was normal in size. There is mild left ventricular hypertrophy. Left ventricular diastolic parameters are consistent with Grade I diastolic dysfunction (impaired relaxation). Right Ventricle: The right ventricular size is normal. No increase in right ventricular wall thickness. Right ventricular systolic function is normal. There is normal pulmonary artery systolic pressure. The tricuspid regurgitant velocity is 2.55 m/s, and  with an assumed right atrial pressure of 3 mmHg, the estimated right ventricular systolic pressure is 29.0 mmHg. Left Atrium: Left atrial size was normal in size. Right Atrium: Right atrial size was normal in size. Pericardium: There is no evidence of pericardial effusion. Mitral Valve: The mitral valve is degenerative in appearance. Moderate mitral annular calcification. Trivial mitral valve regurgitation. No evidence of mitral valve stenosis. Tricuspid Valve: The tricuspid valve is normal in structure. Tricuspid valve regurgitation is mild . No evidence of tricuspid stenosis. Aortic Valve: The aortic valve is normal in structure. There is  mild calcification of the aortic valve. Aortic valve regurgitation is mild. No aortic stenosis is present. Pulmonic Valve: The pulmonic valve was normal in structure. Pulmonic valve regurgitation is trivial. No evidence of pulmonic stenosis. Aorta: The aortic root is normal in size and structure. Venous: The inferior vena cava is normal in size with greater than 50% respiratory variability, suggesting right atrial pressure of 3 mmHg. IAS/Shunts: No atrial level shunt  detected by color flow Doppler.  LEFT VENTRICLE PLAX 2D LVIDd:         3.70 cm  Diastology LVIDs:         1.90 cm  LV e' medial:    5.00 cm/s LV PW:         1.10 cm  LV E/e' medial:  19.8 LV IVS:        1.10 cm  LV e' lateral:   4.46 cm/s LVOT diam:     1.70 cm  LV E/e' lateral: 22.2 LV SV:         57 LV SV Index:   33 LVOT Area:     2.27 cm  RIGHT VENTRICLE RV S prime:     17.70 cm/s TAPSE (M-mode): 2.7 cm LEFT ATRIUM             Index       RIGHT ATRIUM           Index LA diam:        3.40 cm 1.96 cm/m  RA Area:     12.30 cm LA Vol (A2C):   40.3 ml 23.27 ml/m RA Volume:   24.90 ml  14.38 ml/m LA Vol (A4C):   53.5 ml 30.89 ml/m LA Biplane Vol: 49.5 ml 28.58 ml/m  AORTIC VALVE LVOT Vmax:         112.00 cm/s LVOT Vmean:        84.800 cm/s LVOT VTI:          0.253 m AR Vena Contracta: 0.30 cm  AORTA Ao Root diam: 2.90 cm Ao Asc diam:  3.40 cm MITRAL VALVE                TRICUSPID VALVE MV Area (PHT): 2.42 cm     TR Peak grad:   26.0 mmHg MV Decel Time: 313 msec     TR Vmax:        255.00 cm/s MV E velocity: 98.80 cm/s MV A velocity: 112.00 cm/s  SHUNTS MV E/A ratio:  0.88         Systemic VTI:  0.25 m                             Systemic Diam: 1.70 cm Weston Brass MD Electronically signed by Weston Brass MD Signature Date/Time: 04/04/2021/10:29:03 PM    Final     Patient Profile     85 y.o. female with past medical history of mild nonobstructive coronary disease by catheterization in 2020, paroxysmal atrial fibrillation, COPD, previous  TIA admitted with wide-complex tachycardia.  Patient developed sudden onset palpitations with substernal chest burning similar to her previous symptoms with atrial fibrillation.  In the emergency room she underwent emergent cardioversion as she was hemodynamically unstable.  She was admitted.  Assessment & Plan    1 wide-complex tachycardia- - rate approx  150 - Dr Elberta Fortis saw, AF vs SVT with abberancy but Dr Elberta Fortis feels SVT w/ aberrancy more likely - He reviewed possible meds >> amio 200 mg bid x 2 weeks, then 200 mg qd starting 06/15 - he will arrange EP study w/ possible SVT/AF ablation as an outpt - no recurrence overnight - EF nl w/ both atria normal in size  2 PAF- - on amio and Eliquis  3 minimally elevated troponin- - after DCCV - no ischemic sx and hx non-obs dz 2020 - no further workup  4 history of cardiomyopathy- -  low BP in ER >> meds held including Entresto 97-103, aldactone 25 mg qd, Lasix 40 mg qd prn, amlodipine 2.5 mg qd - Coreg 6.25 mg bid is now metop 50 mg bid - will restart Entresto at 24-26 since SBP 110s at times, otherwise, per IM   For questions or updates, please contact [facility] Please consult [email or web address] for contact info under   Signed, Theodore Demark, PA-C  04/05/2021, 10:12 AM   As above; pt seen and examined; no CP or dyspnea; no recurrence of arrhythmia on telemetry. Review with EP yesterday and feeling is this is likely atrial tachycardia with aberrancy. Continue amiodarone 200 BID for 2 weeks then decrease to 200 mg daily. Continue cardizem and metoprolol. Will not resume entresto, coreg, norvasc or spironolactone as BP boderline; schedule fu with Dr Elberta Fortis (he likely plans ablation as outpt). FU with Dr Eden Emms as scheduled.  > 30 min PA and physician time D2 Olga Millers

## 2021-04-05 NOTE — Plan of Care (Signed)

## 2021-04-05 NOTE — Progress Notes (Signed)
D/C instructions given and reviewed. Tele and IV removed, tolerated well. Family at bedside to transport home. 

## 2021-04-09 ENCOUNTER — Other Ambulatory Visit: Payer: Self-pay

## 2021-04-09 NOTE — Telephone Encounter (Signed)
Patient called back returning Lynn's call

## 2021-04-10 ENCOUNTER — Ambulatory Visit (INDEPENDENT_AMBULATORY_CARE_PROVIDER_SITE_OTHER): Payer: Medicare Other | Admitting: Adult Health

## 2021-04-10 ENCOUNTER — Encounter: Payer: Self-pay | Admitting: Adult Health

## 2021-04-10 VITALS — BP 122/70 | HR 67 | Temp 97.9°F | Ht 61.0 in | Wt 162.0 lb

## 2021-04-10 DIAGNOSIS — I10 Essential (primary) hypertension: Secondary | ICD-10-CM

## 2021-04-10 DIAGNOSIS — I480 Paroxysmal atrial fibrillation: Secondary | ICD-10-CM | POA: Diagnosis not present

## 2021-04-10 DIAGNOSIS — R000 Tachycardia, unspecified: Secondary | ICD-10-CM

## 2021-04-10 DIAGNOSIS — I472 Ventricular tachycardia: Secondary | ICD-10-CM

## 2021-04-10 NOTE — Telephone Encounter (Signed)
**Note De-Identified [place] Koors Obfuscation** No answer so I left a message on the pts  VM asking the pt to call Larita Fife at Dr Fabio Bering office at [facility] at [phone].

## 2021-04-10 NOTE — Progress Notes (Signed)
Subjective:    Patient ID: Julie Rice, female    DOB: 1936-06-05, 85 y.o.   MRN: [id number]  HPI 85 year old female who  has a past medical history of A-fib (HCC), Anemia, Anxiety, Bipolar disorder (HCC), Blood transfusion (1991), CAD (coronary artery disease), Cataract, Chest pain, CHF (congestive heart failure) (HCC) (07/2020), Chronic back pain, COPD (chronic obstructive pulmonary disease) (HCC), Depression, Esophageal stricture, Fibromyalgia, GERD (gastroesophageal reflux disease), Heart murmur, Hiatal hernia, Hyperlipidemia, Hypertension, Internal hemorrhoids, Lumbago, Mitral regurgitation, Osteoarthritis, Osteoporosis, Pneumonia (~ 01/2018), PONV (postoperative nausea and vomiting), Rectal bleeding, Rheumatoid arthritis (HCC), Spondylosis, TIA (transient ischemic attack) (2013), and Urinary incontinence.  She presents to the office today for hospital follow up   Admit Date: 04/03/2021 Discharge Date: 04/05/2021  She developed sudden onset palpitations with substernal chest burning on 531 that she described as similar to her previous symptoms with atrial fibrillation.  She was in [facility] and EMS was called.  They gave her Cardizem 10 mg IV as well as sublingual nitro for chest pain and aspirin 324 mg.  Her heart rate decreased into the 150s after the meds.  While in the emergency room she was complaining of increasing pain in her chest, jaw, and left arm.  She was hemodynamically unstable and was cardioverted a total of 3 times in the ER.  She was admitted to stepdown  Hospital Course  She was started on IV amiodarone.  Her echocardiogram was repeated to assess LV function, it had normalized.  Rhythm was felt to be atrial flutter with aberrancy versus SVT.  EP was consulted  She was seen by Dr. Elberta Fortis and was placed on amiodarone 200 mg twice daily x2 weeks then 200 mg daily starting on 615.  She was also started on metoprolol ( changed from Coreg).  EP study with possible SVT/AF ablation  as outpatient  Troponin was mildly elevated after cardioversion with a peak of 33.  Chest pain resolved with a cardioversion and was felt to be secondary to her elevated heart rate.  She has a history of cardiomyopathy and was on Entresto, Aldactone, Lasix as needed and amlodipine.  These medications were held on admission.  Sideration can be given restarting them as outpatient.   She reports that today she is feeling much improved since being discharged from the hospital.  She has been monitoring her blood pressure at home and reports readings in the 112's systolic.  She denies chest pain, palpitations, or worsening shortness of breath.   She has an appointment for an ablation on June 2022.  Review of Systems    Past Medical History:  Diagnosis Date  . A-fib (HCC)   . Anemia   . Anxiety   . Bipolar disorder (HCC)   . Blood transfusion 1991   autologous pts own blood given   . CAD (coronary artery disease)   . Cataract   . Chest pain    "@ rest, lying down, w/exertion"  . CHF (congestive heart failure) (HCC) 07/2020  . Chronic back pain    "mostly lower back but I do have upper back pain regularly" (04/06/2018)  . COPD (chronic obstructive pulmonary disease) (HCC)   . Depression   . Esophageal stricture   . Fibromyalgia   . GERD (gastroesophageal reflux disease)   . Heart murmur    "slight" (04/06/2018)  . Hiatal hernia   . Hyperlipidemia    Patient denies  . Hypertension   . Internal hemorrhoids   . Lumbago   .  Mitral regurgitation   . Osteoarthritis   . Osteoporosis   . Pneumonia ~ 01/2018  . PONV (postoperative nausea and vomiting)    severe ponv, "in the past" (04/06/2018)  . Rectal bleeding   . Rheumatoid arthritis (HCC)   . Spondylosis   . TIA (transient ischemic attack) 2013  . Urinary incontinence    wears depends    Social History   Socioeconomic History  . Marital status: Widowed    Spouse name: Not on file  . Number of children: 2  . Years of  education: Not on file  . Highest education level: Not on file  Occupational History  . Occupation: retired    Associate Professor: RETIRED  Tobacco Use  . Smoking status: Never Smoker  . Smokeless tobacco: Never Used  Vaping Use  . Vaping Use: Never used  Substance and Sexual Activity  . Alcohol use: Never  . Drug use: Never  . Sexual activity: Not Currently    Comment: 1st intercourse 31 yo-1 partner  Other Topics Concern  . Not on file  Social History Narrative   Retired    Widowed   Social Determinants of Corporate investment banker Strain: Low Risk   . Difficulty of Paying Living Expenses: Not hard at all  Food Insecurity: No Food Insecurity  . Worried About Programme researcher, broadcasting/film/video in the Last Year: Never true  . Ran Out of Food in the Last Year: Never true  Transportation Needs: No Transportation Needs  . Lack of Transportation (Medical): No  . Lack of Transportation (Non-Medical): No  Physical Activity: Inactive  . Days of Exercise per Week: 0 days  . Minutes of Exercise per Session: 0 min  Stress: Stress Concern Present  . Feeling of Stress : To some extent  Social Connections: Socially Isolated  . Frequency of Communication with Friends and Family: Twice a week  . Frequency of Social Gatherings with Friends and Family: Never  . Attends Religious Services: 1 to 4 times per year  . Active Member of Clubs or Organizations: No  . Attends Banker Meetings: Never  . Marital Status: Widowed  Intimate Partner Violence: Not At Risk  . Fear of Current or Ex-Partner: No  . Emotionally Abused: No  . Physically Abused: No  . Sexually Abused: No    Past Surgical History:  Procedure Laterality Date  . ABDOMINAL HYSTERECTOMY  1982  . BACK SURGERY    . BALLOON DILATION N/A 09/21/2018   Procedure: BALLOON DILATION;  Surgeon: Hilarie Fredrickson, MD;  Location: [facility] ENDOSCOPY;  Service: Endoscopy;  Laterality: N/A;  . BALLOON DILATION N/A 08/12/2019   Procedure: BALLOON DILATION;   Surgeon: Lynann Bologna, MD;  Location: [facility] ENDOSCOPY;  Service: Endoscopy;  Laterality: N/A;  . BIOPSY  08/12/2019   Procedure: BIOPSY;  Surgeon: Lynann Bologna, MD;  Location: [facility] ENDOSCOPY;  Service: Endoscopy;;  . BLADDER SUSPENSION  1980's  . BOTOX INJECTION N/A 09/21/2018   Procedure: BOTOX INJECTION;  Surgeon: Hilarie Fredrickson, MD;  Location: [facility] ENDOSCOPY;  Service: Endoscopy;  Laterality: N/A;  . BOTOX INJECTION N/A 08/12/2019   Procedure: BOTOX INJECTION;  Surgeon: Lynann Bologna, MD;  Location: [facility] ENDOSCOPY;  Service: Endoscopy;  Laterality: N/A;  . CATARACT EXTRACTION W/ INTRAOCULAR LENS  IMPLANT, BILATERAL Bilateral 2010  . DILATION AND CURETTAGE OF UTERUS  1961  . ESOPHAGEAL MANOMETRY N/A 03/25/2018   Procedure: ESOPHAGEAL MANOMETRY (EM);  Surgeon: Napoleon Form, MD;  Location: [facility] ENDOSCOPY;  Service: Endoscopy;  Laterality: N/A;  . ESOPHAGOGASTRODUODENOSCOPY (EGD) WITH PROPOFOL N/A 04/07/2018   Procedure: ESOPHAGOGASTRODUODENOSCOPY (EGD) WITH PROPOFOL;  Surgeon: Sherrilyn Rist, MD;  Location: [facility] ENDOSCOPY;  Service: Gastroenterology;  Laterality: N/A;  . ESOPHAGOGASTRODUODENOSCOPY (EGD) WITH PROPOFOL N/A 09/21/2018   Procedure: ESOPHAGOGASTRODUODENOSCOPY (EGD) WITH PROPOFOL;  Surgeon: Hilarie Fredrickson, MD;  Location: [facility] ENDOSCOPY;  Service: Endoscopy;  Laterality: N/A;  . ESOPHAGOGASTRODUODENOSCOPY (EGD) WITH PROPOFOL N/A 08/12/2019   Procedure: ESOPHAGOGASTRODUODENOSCOPY (EGD) WITH PROPOFOL;  Surgeon: Lynann Bologna, MD;  Location: [facility] ENDOSCOPY;  Service: Endoscopy;  Laterality: N/A;  . HERNIA REPAIR    . HIATAL HERNIA REPAIR N/A 08/02/2016   Procedure: LAPAROSCOPIC REPAIR OF LARGE  HIATAL HERNIA;  Surgeon: Glenna Fellows, MD;  Location: [facility] ORS;  Service: General;  Laterality: N/A;  . JOINT REPLACEMENT    . LAPAROSCOPIC NISSEN FUNDOPLICATION N/A 08/02/2016   Procedure: LAPAROSCOPIC NISSEN FUNDOPLICATION;  Surgeon: Glenna Fellows, MD;  Location: [facility] ORS;  Service: General;   Laterality: N/A;  . LUMBAR LAMINECTOMY  1990; 1994; [id number]   "I've got 2 stainless steel rods; 6 screws; 2 ray cages"took bone from right hip to put in back  . RIGHT/LEFT HEART CATH AND CORONARY ANGIOGRAPHY N/A 07/26/2019   Procedure: RIGHT/LEFT HEART CATH AND CORONARY ANGIOGRAPHY;  Surgeon: Lyn Records, MD;  Location: [facility] INVASIVE CV LAB;  Service: Cardiovascular;  Laterality: N/A;  . TEE WITHOUT CARDIOVERSION N/A 08/19/2019   Procedure: TRANSESOPHAGEAL ECHOCARDIOGRAM (TEE);  Surgeon: Lewayne Bunting, MD;  Location: [facility] ENDOSCOPY;  Service: Cardiovascular;  Laterality: N/A;  . TONSILLECTOMY AND ADENOIDECTOMY  1945  . TOTAL KNEE ARTHROPLASTY Right ~ 1996  . TUBAL LIGATION  ~ 1976    Family History  Problem Relation Age of Onset  . Kidney disease Mother   . Hypertension Mother   . Heart disease Father 48       die of MI at age 75  . Suicidality Son   . Heart disease Brother   . Heart attack Brother   . Colon cancer Neg Hx   . Esophageal cancer Neg Hx   . Pancreatic cancer Neg Hx   . Rectal cancer Neg Hx   . Stomach cancer Neg Hx     Allergies  Allergen Reactions  . Morphine Other (See Comments)    Flushing, rash, itching  . Tape Other (See Comments)    Band aides, adhesive tape Redness and pulls skin off  . Tramadol Other (See Comments)    Pt has prolonged Qtc interval of 540- cannot give tramadol per pharmacy    Current Outpatient Medications on File Prior to Visit  Medication Sig Dispense Refill  . acetaminophen (TYLENOL) 500 MG tablet Take 1,000 mg by mouth every 6 (six) hours as needed for moderate pain, headache or fever.    Marland Kitchen albuterol (VENTOLIN HFA) 108 (90 Base) MCG/ACT inhaler TAKE 2 PUFFS BY MOUTH EVERY 6 HOURS AS NEEDED FOR WHEEZE OR SHORTNESS OF BREATH (Patient taking differently: Inhale 2 puffs into the lungs every 6 (six) hours as needed for wheezing or shortness of breath.) 8 g 2  . amiodarone (PACERONE) 200 MG tablet Take 1 tablet (200 mg total) by mouth  twice daily for 2 weeks, then 1 tablet daily. 104 tablet 3  . apixaban (ELIQUIS) 5 MG TABS tablet Take 1 tablet (5 mg total) by mouth 2 (two) times daily. 180 tablet 1  . Carboxymethylcellulose Sodium (REFRESH TEARS OP) Place 1 drop into both eyes at bedtime.    . cetirizine (ZYRTEC) 10  MG tablet Take 10 mg by mouth daily as needed for allergies.    . clonazePAM (KLONOPIN) 0.5 MG tablet TAKE 1 TABLET BY MOUTH TWICE A DAY AS NEEDED FOR ANXIETY (Patient taking differently: Take 0.5 mg by mouth 2 (two) times daily as needed for anxiety.) 60 tablet 2  . DULoxetine (CYMBALTA) 60 MG capsule TAKE 1 CAPSULE BY MOUTH EVERY DAY (Patient taking differently: Take 60 mg by mouth daily.) 90 capsule 3  . furosemide (LASIX) 40 MG tablet TAKE 1 TABLET (40 MG TOTAL) BY MOUTH DAILY AS NEEDED FOR FLUID OR EDEMA. (Patient taking differently: Take 40 mg by mouth daily as needed for fluid.) 90 tablet 3  . gabapentin (NEURONTIN) 100 MG capsule Take 100 mg by mouth 3 (three) times daily.    Marland Kitchen gabapentin (NEURONTIN) 300 MG capsule Take 1 capsule (300 mg total) by mouth 3 (three) times daily. 270 capsule 1  . HYDROcodone-acetaminophen (NORCO/VICODIN) 5-325 MG tablet hydrocodone 5 mg-acetaminophen 325 mg tablet  TAKE 1 TABLET EVERY 6 HOURS AS NEEDED FOR SEVERE PAIN (IN PLACE OF TRAMADOL DUE TO PROLONGED QT).    Marland Kitchen metoprolol tartrate (LOPRESSOR) 50 MG tablet Take 1 tablet (50 mg total) by mouth 2 (two) times daily. 60 tablet 11  . Multiple Vitamins-Minerals (ONE-A-DAY WOMENS 50+) TABS Take 1 tablet by mouth daily.    Marland Kitchen omeprazole (PRILOSEC) 20 MG capsule TAKE 1 CAPSULE BY MOUTH EVERY DAY (Patient taking differently: Take 20 mg by mouth daily.) 90 capsule 3   No current facility-administered medications on file prior to visit.    BP 122/70   Pulse 67   Temp 97.9 F (36.6 C) (Oral)   Ht 5\' 1"  (1.549 m)   Wt 162 lb (73.5 kg)   SpO2 100%   BMI 30.61 kg/m       Objective:   Physical Exam Vitals and nursing note  reviewed.  Constitutional:      Appearance: Normal appearance.  Cardiovascular:     Rate and Rhythm: Normal rate and regular rhythm.     Pulses: Normal pulses.     Heart sounds: Normal heart sounds.  Pulmonary:     Effort: Pulmonary effort is normal.     Breath sounds: Normal breath sounds.  Musculoskeletal:        General: Normal range of motion.     Right lower leg: No edema.     Left lower leg: No edema.  Skin:    General: Skin is warm and dry.     Capillary Refill: Capillary refill takes less than 2 seconds.  Neurological:     General: No focal deficit present.     Mental Status: She is alert and oriented to person, place, and time.     Gait: Gait abnormal.  Psychiatric:        Mood and Affect: Mood normal.        Behavior: Behavior normal.        Thought Content: Thought content normal.        Judgment: Judgment normal.       Assessment & Plan:  1. Wide-complex tachycardia (HCC) - SR today  - Reviewed hospital notes, imaging, and discharge instructions. All questions answer to the best of my ability  - Follow up with Cardiology as directed   2. Paroxysmal atrial fibrillation (HCC) - Continue with BB and Eliquis   3. Essential hypertension - Normotensive  - Continue to monitor at home  - Follow up as needed  , NP

## 2021-04-11 ENCOUNTER — Encounter: Payer: Self-pay | Admitting: Internal Medicine

## 2021-04-11 ENCOUNTER — Other Ambulatory Visit: Payer: Self-pay

## 2021-04-11 ENCOUNTER — Ambulatory Visit: Payer: Medicare Other | Admitting: Internal Medicine

## 2021-04-11 DIAGNOSIS — R0600 Dyspnea, unspecified: Secondary | ICD-10-CM | POA: Diagnosis not present

## 2021-04-11 DIAGNOSIS — R0609 Other forms of dyspnea: Secondary | ICD-10-CM

## 2021-04-11 NOTE — Assessment & Plan Note (Addendum)
PFTs 04/22/2019 min airflow obst  Chest CTa  08/17/2019 No evidence of aortic aneurysm or dissection. No evidence of pulmonary embolus. Moderate bilateral pleural effusions. Compressive atelectasis in the lower lobes.  Scattered reticulonodular densities and ground-glass nodular densities throughout the lungs are stable. Dilated, fluid-filled esophagus is stable. Echo 08/19/2019 1. Left ventricular ejection fraction, by visual estimation, is 25 to 30%. The left ventricle has severely decreased function. There is no left ventricular hypertrophy.  2. Global right ventricle has normal systolic function.The right ventricular size is normal.  3. Left atrial size was severely dilated.  4. Right atrial size was moderately dilated.  Amiodarone started 08/23/2019  - 09/07/2019   Walked RA x one lap =  approx 250 ft - stopped due to  Sob with sats 100% -  04/11/2021   Walked RA  2 laps @ approx 26ft each @ slow/ cane  pace  stopped due to end of study, no desats, no sob but"tired"   Likely cardiogenic/ conditioning and not pulmonary issue per se nor evidence of amiodarone toxicity   Patients typically have been on amiodarone for 6-12 months before this complication manifests.  Of note, serial clinical evaluation for symptoms such as cough dyspnea or fevers is  the preferred method of monitoring for pulmonary toxicity because a decrease in DLCO or lung volumes is a nonspecific for toxicity. Pathologically amiodarone pulmonary toxicity may appear as interstitial pneumonitis, eosinophilic pneumonia, organizing pneumonia, pulmonary fibrosis or less commonly as diffuse alveolar hemorrhage, pulmonary nodules or pleural effusions.  Risk factors for pulmonary toxicity include age greater than 60, daily dose greater than equal to 400 mg, a high cumulative dose, or pre-existing lung disease    Rec:  Make sure you check your oxygen saturation at your highest level of activity to be sure it stays over 90% and keep track  of it at least once a week, more often if breathing getting worse, and let me know if losing ground.    Each maintenance medication was reviewed in detail including emphasizing most importantly the difference between maintenance and prns and under what circumstances the prns are to be triggered using an action plan format where appropriate.  Total time for H and P, chart review, counseling,  directly observing portions of ambulatory 02 saturation study/ and generating customized AVS unique to this office visit / same day charting = 

## 2021-04-11 NOTE — Progress Notes (Signed)
Subjective:     Patient ID: [name], female   DOB: 1936-03-07,   MRN: [id number]    Brief patient profile:  85  yowf never smoker dx RA followed by Denyse Dago on nsaids with new doe around 2000 eval by Dr Jayme Cloud: inhaler helped some but never back to baseline then rx by [facility] 08/02/16 LAPAROSCOPIC REPAIR OF LARGE  HIATAL HERNIA LAPAROSCOPIC NISSEN FUNDOPLICATION >>  and dysphagia ever since with eval [name] 02/19/17 EGD dilated benign esoph stenosis and improved but  new cough /gag/vomit around Nov 04 2017 then abrupt pain under R breast x around 12/20/17 then cxr 12/22/17 c/w pna with cavitary changes on CT 12/25/17 so referred to pulmonary clinic 12/31/2017 by Dr  Kirtland Bouchard p rx with augmentin starting 12/25/17 for possible lung abscess.     History of Present Illness  12/31/2017 1st [facility] office visit/ Meleena Munroe   Chief Complaint  Patient presents with  . Pulmonary Consult    Referred by Dr. Amador Cunas. Pt c/o cough since Jan 2019. She states she has had SOB off and on for the past several yrs. She was recently dxed with PNA. Her cough is non prod and worse at night.   since abx started R side CP  improved, cough still a problem > beige esp at hs / still gag/ vomit with overt HB not on ppi daily ac Lower molar R dental work one month prior to onset of cp Sleeps on L side typically not on back  rec Augmentin 875 mg take one pill twice daily  X 10 days   Protonix 20 mg x 2 (40 mg called in)   Take 30- 60 min before your first and last meals of the day  GERD  diet Take delsym two tsp every 12 hours and supplement if needed with  tramadol 50 mg up to 1 every 4 hours     01/12/2018  f/u ov/Sherel Fennell re: lung abscess  "I'm no better at all"  Chief Complaint  Patient presents with  . Follow-up    SOB with activity and at rest, non-productive cough, chest pain bilateral sides behind breast,   Dyspnea:  No better Cough: no sputum production  Sleep: tramadol works well / only taking twice  daily (rec was for up to 1 q4)  Cp now 2/10 only feels it with deep breath  rec Take delsym two tsp every 12 hours and supplement if needed with  tramadol 50 mg up to 1 every 4 hours to suppress the urge to cough. Swallowing water or using ice chips/non mint and menthol containing candies (such as lifesavers or sugarless jolly ranchers) are also effective.  You should rest your voice and avoid activities that you know make you cough. Once you have eliminated the cough for 3 straight days try reducing the tramadol first,  then the delsym as tolerated. Please remember to go to the  x-ray department downstairs in the basement  for your tests - we will call you with the results when they are available. Clindamycin 150 mg four times a day x 10 days and then return with cxr      Add:  Changed to 300 mg tid and advised by phone     02/05/2018  f/u ov/Kavish Lafitte re: final f/u re lung abscess from ? Asp pna  Chief Complaint  Patient presents with  . Follow-up    ER VISIT 3.31.19, NON PRODUCTIVE COUGH  R cp leveled off  Now can barely feel  it with the deepest breath Sob the same  At rest and worse walking  Cough is improved but still waking up with it and also some daytime but dry  N and V ? From clindamycin  Coughed so hard on 01/30/18 started to hurt L chest > to ER 02/01/18  New med from ER = zofran > eating and drinking now  Last clindamycin about a week prior to OV  rec Please see Dr [name] as soon as possible as I suspect your swallowing problem caused your lung problem which is slowly healing Stop your clindamycin  You will need a follow up cxr in 3 months - fine to let Dr [name] do this  - return here if cough or pain with breathing gets worse with all active medications in hand including the over the counter ones     Admit date: 08/16/2019 Discharge date: 08/24/2019  Recommendations for Outpatient Follow-up:  1. Eat only sitting up at 90 degrees. 2. Follow up with PCP in 7-10 days. 3. Chemistry  to be drawn in 7 days and reported to PCP. 4. Patient to follow up with [name] within 2 weeks. 5. Patient to follow up with [name] within one month.    Discharge Diagnoses: Principal diagnosis is #1 1. Atrial fibrillation with RVR 2. Chest Pain 3. Intractable nausea and vomiting 4. Elevated troponin 5. Hypothyroidism 6. Dyslipidemia 7. Anemia 8. Hypertension 9. GERD 10. Prolonged QT interval 11. History of systolic CHF 12. Asthma  Discharge Condition: Fair  Disposition: Home  Diet recommendation: regular       Filed Weights   08/20/19 0410 08/21/19 0443 08/22/19 0559  Weight: 65.3 kg 64.6 kg 64.7 kg    History of present illness:   Ms.Cofferlooks to have been seen very remotely (1990's) by [name] and not since then. Recently referred back for some reports of CP and palpitations, though presented to the hospital last month prior To her appointment date. She was noted to have new reduced LVEF 40%, CHF, was in SR w/PVCs. Cardiology noted she had a 4 day monitor via her PMD noting intermittent runs of SVT with the longest being 13 beats. She was diuresed, underwent R/LHC Widely patent coronary arteries,Low right heart and LV filling pressures During her stay she had an SVT that responded to adenosine She was discharged 07/27/2019  She was re-admitted 08/07/2019 with SOB, hypoxic resp failure, found with aspiration pneumonia.Barium swallow study showed significant narrowing of the upper part of the esophagus but no esophageal stricture. Underwent EGD with finding of benign-appearing esophageal stenosis.Status post fundoplication, Botox injection, balloon dilation. I do not see that she had any cardiac issues during this hospitalization  Admitted to [facility] this admission 08/16/2019 with c/o on/off CP, HHRN noted pulse irregular, ranging 60-140 and recommended she go to the ER. She was found in AFib w/RVR New echo this admission noted EF up some to  40-45%, initially placed on dilt though changed to BB, and heparin gtt for a/c Looks like she is planned for TEE/DCCV, though ? If pending GI 1st given her history and ongoing nausea, as well as any anticoagulation recommendations, concerns, for final anticoagulation plan  EP is asked to weigh in on AAD options, noting severely dilated LA, likely to need drug to help maintain SR. Rhythm control preferred strategy with reduced LVEF ad recent CHF exacerbation.  Hospital Course:  85 y.o.femalewith medical history significant of hypertension, hyperlipidemia, systolic CHF, fibromyalgia, anemia, esophageal strictures/p balloon dilation, large symptomatic hiatal  hernia status post fundoplication, chronic dysphagia, asthma/COPD on oxygen at 2 L at home, CAD, TIA, bipolar disorderfibromyalgia, rheumatoid arthritis, TIA, chronic urinary incontinence.   She presented with heart rate 60s-140at home per home health nurse. Patient has not filled her medications since her discharge with still having some intermittent chest pain and she was told to come to emergency department. She reports while hospitalized she has an episode of tachycardia with HR p to 170's she was given a medicine and then her HR has come down. She has no known hx of A.fib  She took 2 nitro and 3/24 of aspirin and presented to emergency department.Chest pain report on and off for weeks, is worse with swallowing. Has been swallowing soft food ok.Reported burning pain in her chest radiating to her back and arms.She felt very weak and fell back wards no LOC.No fever no chills.Last night she felt sweaty her temp was 99.9.Reports Shortness of breath since last summerbut hasbeen getting progressively worse.  Relative recent history: Initially admitted on 10/3/20200with shortness of breath and chest pain was found to have lower lobe pneumonia thought to be secondary to aspiration COVID negative treated with broad-spectrum  antibiotics there was worrisome for esophageal stenosis GI was consulted and she underwent balloon dilatation and Botox injection Physical therapy seen her and recommended home health and discharge and she was discharged home on 9 October.  EKG showed no evidence of ischemic changes thought to be secondary to demand ischemia.     09/07/2019  f/u ov/Davie Claud re: doe  Chief Complaint  Patient presents with  . Hospitalization Follow-up    Pt states having SOB when she first wakes up in the am's. She is using o2 with sleep 2lpm. She is using her albuterol inhaler once per day on average.   Dyspnea:  Better p 1st hour but still  MMRC3 = can't walk 100 yards even at a slow pace at a flat grade s stopping due to sob  / hc parking  Cough: 3am couple times a week x months  Sleeping: rotated L side down two pillows  SABA use: not sure it's helping  02: 2lpm hs only  rec GERD diet    10/19/2019  f/u ov/Glendi Mohiuddin re: doe improving  Chief Complaint  Patient presents with  . Follow-up    Patient is doing better than last visit. Patient is getting short of breath with daily activies. Patient wears 2L at night.  Dyspnea:  Mailbox and back is ok /flat 100 ft each way Cough: recurring around 3 am   like a tickle in throat/ non productive  Sleeping: sleeping on mattress wedge helps  SABA use: once day q am not clear why - doesn't really use it prn  02: 2lpm hs only  rec Please remember to go to the  x-ray department  for your tests - we will call you with the results when they are available   Add pepcid 20 mg at bedtime  Albuterol is a "rescue medication"  for relief of breathlessness  that does not improve by walking a slower pace or resting  for a few minutes (it doesn't really start working for 5-15 minutes anyway so ok to wait to see if resting helps)   04/11/2021  f/u ov/Alfretta Pinch re: chf/ uacs  Still on amiodarone no longer entrestro Chief Complaint  Patient presents with  . Shortness of Breath     Dyspnea for several weeks; patient states cardiac ablation scheduled soon and here for pulmonary clearance  Dyspnea: mb and back / flat stops on return with sats around 91-92% which is an improvement  Cough: none  Sleeping: on L side on 30 degrees wedge  SABA use: seems to need in am 4 x weekly  02: 2lpm hs  Covid status:   vax x J&J never infected    No obvious day to day or daytime variability or assoc excess/ purulent sputum or mucus plugs or hemoptysis or cp or chest tightness, subjective wheeze or overt sinus or hb symptoms.   Sleeping as above  without nocturnal  or early am exacerbation  of respiratory  c/o's or need for noct saba. Also denies any obvious fluctuation of symptoms with weather or environmental changes or other aggravating or alleviating factors except as outlined above   No unusual exposure hx or h/o childhood pna/ asthma or knowledge of premature birth.  Current Allergies, Complete Past Medical History, Past Surgical History, Family History, and Social History were reviewed in [employer] record.  ROS  The following are not active complaints unless bolded Hoarseness, sore throat, dysphagia, dental problems, itching, sneezing,  nasal congestion or discharge of excess mucus or purulent secretions, ear ache,   fever, chills, sweats, unintended wt loss or wt gain, classically pleuritic or exertional cp,  orthopnea pnd or arm/hand swelling  or leg swelling, presyncope, palpitations, abdominal pain, anorexia, nausea, vomiting, diarrhea  or change in bowel habits or change in bladder habits, change in stools or change in urine, dysuria, hematuria,  rash, arthralgias, visual complaints, headache, numbness, weakness or ataxia or problems with walking or coordination,  change in mood or  memory.        Current Meds  Medication Sig  . acetaminophen (TYLENOL) 500 MG tablet Take 1,000 mg by mouth every 6 (six) hours as needed for moderate pain, headache or  fever.  Marland Kitchen albuterol (VENTOLIN HFA) 108 (90 Base) MCG/ACT inhaler TAKE 2 PUFFS BY MOUTH EVERY 6 HOURS AS NEEDED FOR WHEEZE OR SHORTNESS OF BREATH (Patient taking differently: Inhale 2 puffs into the lungs every 6 (six) hours as needed for wheezing or shortness of breath.)  . amiodarone (PACERONE) 200 MG tablet Take 1 tablet (200 mg total) by mouth twice daily for 2 weeks, then 1 tablet daily.  Marland Kitchen apixaban (ELIQUIS) 5 MG TABS tablet Take 1 tablet (5 mg total) by mouth 2 (two) times daily.  . Carboxymethylcellulose Sodium (REFRESH TEARS OP) Place 1 drop into both eyes at bedtime.  . cetirizine (ZYRTEC) 10 MG tablet Take 10 mg by mouth daily as needed for allergies.  . clonazePAM (KLONOPIN) 0.5 MG tablet TAKE 1 TABLET BY MOUTH TWICE A DAY AS NEEDED FOR ANXIETY (Patient taking differently: Take 0.5 mg by mouth 2 (two) times daily as needed for anxiety.)  . DULoxetine (CYMBALTA) 60 MG capsule TAKE 1 CAPSULE BY MOUTH EVERY DAY (Patient taking differently: Take 60 mg by mouth daily.)  . furosemide (LASIX) 40 MG tablet TAKE 1 TABLET (40 MG TOTAL) BY MOUTH DAILY AS NEEDED FOR FLUID OR EDEMA. (Patient taking differently: Take 40 mg by mouth daily as needed for fluid.)  . gabapentin (NEURONTIN) 300 MG capsule Take 1 capsule (300 mg total) by mouth 3 (three) times daily.  Marland Kitchen HYDROcodone-acetaminophen (NORCO/VICODIN) 5-325 MG tablet hydrocodone 5 mg-acetaminophen 325 mg tablet  TAKE 1 TABLET EVERY 6 HOURS AS NEEDED FOR SEVERE PAIN (IN PLACE OF TRAMADOL DUE TO PROLONGED QT).  Marland Kitchen metoprolol tartrate (LOPRESSOR) 50 MG tablet Take 1 tablet (50 mg total)  by mouth 2 (two) times daily.  . Multiple Vitamins-Minerals (ONE-A-DAY WOMENS 50+) TABS Take 1 tablet by mouth daily.  Marland Kitchen omeprazole (PRILOSEC) 20 MG capsule TAKE 1 CAPSULE BY MOUTH EVERY DAY (Patient taking differently: Take 20 mg by mouth daily.)                                    Objective:   Physical Exam   04/11/2021        162  10/19/2019     140    09/07/2019        136  02/05/2018         152  01/12/2018        159   12/31/17 160 lb 9.6 oz (72.8 kg)  12/29/17 162 lb (73.5 kg)  12/22/17 164 lb 4 oz (74.5 kg)    Vital signs reviewed  04/11/2021  - Note at rest 02 sats  98% on RA   General appearance:  amb pleasant wf  Walks with suction cup cane      HEENT : pt wearing mask not removed for exam due to covid -19 concerns.    NECK :  without JVD/Nodes/TM/ nl carotid upstrokes bilaterally   LUNGS: no acc muscle use,  Nl contour chest which is clear to A and P bilaterally without cough on insp or exp maneuvers   CV:  RRR  no s3 or murmur or increase in P2, and trace pitting edema  both ankles   ABD:  soft and nontender with nl inspiratory excursion in the supine position. No bruits or organomegaly appreciated, bowel sounds nl  MS:  Slow gait with cane / ext warm without deformities, calf tenderness, cyanosis or clubbing No obvious joint restrictions   SKIN: warm and dry without lesions    NEURO:  alert, approp, nl sensorium with  no motor or cerebellar deficits apparent.              Assessment:

## 2021-04-11 NOTE — Patient Instructions (Signed)
Only use your albuterol as a rescue medication to be used if you can't catch your breath by resting or doing a relaxed purse lip breathing pattern.  - The less you use it, the better it will work when you need it. - Ok to use up to 2 puffs  every 4 hours if you must but call for immediate appointment if use goes up over your usual need - Don't leave home without it !!  (think of it like the spare tire for your car)   Also ok to Try albuterol 15 min before an activity (on alternating days)  that you know would make you short of breath and see if it makes any difference and if makes none then don't take albuterol after activity unless you can't catch your breath as this means it's the resting that helps, not the albuterol.       Please schedule a follow up office visit in 6 weeks, call sooner if needed with pfts on return try not to use the albuterol that am.

## 2021-04-13 DIAGNOSIS — H01001 Unspecified blepharitis right upper eyelid: Secondary | ICD-10-CM | POA: Diagnosis not present

## 2021-04-13 DIAGNOSIS — H04123 Dry eye syndrome of bilateral lacrimal glands: Secondary | ICD-10-CM | POA: Diagnosis not present

## 2021-04-17 ENCOUNTER — Telehealth: Payer: Self-pay | Admitting: *Deleted

## 2021-04-17 NOTE — Telephone Encounter (Signed)
SVT ablation scheduled for 6/22. Procedure instructions reviewed w/ pt, she is aware to arrive to [facility] at 12:30 pm for procedure. Aware to hold Metoprolol 3 days prior to procedure, take last doses this Saturday. Aware she may take her morning medications w/ sips of water, hold Lasix & any non important medications (such as supplements) Patient verbalized understanding and agreeable to plan.

## 2021-04-24 NOTE — Pre-Procedure Instructions (Signed)
Instructed patient on the following items: °Arrival time 1230 °Nothing to eat or drink after midnight °No meds AM of procedure °Responsible person to drive you home and stay with you for 24 hrs ° °Have you missed any doses of anti-coagulant Eliquis- hasn't missed any doses  °   °

## 2021-04-25 ENCOUNTER — Encounter (HOSPITAL_COMMUNITY)
Admission: RE | Disposition: A | Discharge status: Home / Self Care | Payer: Self-pay | Source: Home / Self Care | Attending: Cardiology

## 2021-04-25 ENCOUNTER — Ambulatory Visit (HOSPITAL_COMMUNITY)
Admission: RE | Admit: 2021-04-25 | Discharge: 2021-04-25 | Disposition: A | Discharge status: Home / Self Care | Payer: Medicare Other | Attending: Cardiology | Admitting: Cardiology

## 2021-04-25 ENCOUNTER — Telehealth: Payer: Self-pay | Admitting: Cardiology

## 2021-04-25 ENCOUNTER — Ambulatory Visit (HOSPITAL_COMMUNITY): Payer: Medicare Other | Admitting: Anesthesiology

## 2021-04-25 ENCOUNTER — Other Ambulatory Visit: Payer: Self-pay

## 2021-04-25 DIAGNOSIS — J449 Chronic obstructive pulmonary disease, unspecified: Secondary | ICD-10-CM | POA: Insufficient documentation

## 2021-04-25 DIAGNOSIS — Z885 Allergy status to narcotic agent status: Secondary | ICD-10-CM | POA: Insufficient documentation

## 2021-04-25 DIAGNOSIS — M797 Fibromyalgia: Secondary | ICD-10-CM | POA: Insufficient documentation

## 2021-04-25 DIAGNOSIS — I110 Hypertensive heart disease with heart failure: Secondary | ICD-10-CM | POA: Insufficient documentation

## 2021-04-25 DIAGNOSIS — K222 Esophageal obstruction: Secondary | ICD-10-CM | POA: Diagnosis not present

## 2021-04-25 DIAGNOSIS — Z79899 Other long term (current) drug therapy: Secondary | ICD-10-CM | POA: Insufficient documentation

## 2021-04-25 DIAGNOSIS — Z888 Allergy status to other drugs, medicaments and biological substances status: Secondary | ICD-10-CM | POA: Insufficient documentation

## 2021-04-25 DIAGNOSIS — I5022 Chronic systolic (congestive) heart failure: Secondary | ICD-10-CM | POA: Diagnosis not present

## 2021-04-25 DIAGNOSIS — J9611 Chronic respiratory failure with hypoxia: Secondary | ICD-10-CM | POA: Diagnosis not present

## 2021-04-25 DIAGNOSIS — I480 Paroxysmal atrial fibrillation: Secondary | ICD-10-CM | POA: Diagnosis not present

## 2021-04-25 DIAGNOSIS — E785 Hyperlipidemia, unspecified: Secondary | ICD-10-CM | POA: Diagnosis not present

## 2021-04-25 DIAGNOSIS — I471 Supraventricular tachycardia: Secondary | ICD-10-CM | POA: Diagnosis not present

## 2021-04-25 DIAGNOSIS — Z7901 Long term (current) use of anticoagulants: Secondary | ICD-10-CM | POA: Insufficient documentation

## 2021-04-25 DIAGNOSIS — E039 Hypothyroidism, unspecified: Secondary | ICD-10-CM | POA: Diagnosis not present

## 2021-04-25 HISTORY — PX: SVT ABLATION: EP1225

## 2021-04-25 SURGERY — SVT ABLATION
Anesthesia: Monitor Anesthesia Care

## 2021-04-25 MED ORDER — BUPIVACAINE HCL (PF) 0.25 % IJ SOLN
INTRAMUSCULAR | Status: AC
  Filled 2021-04-25: qty 60

## 2021-04-25 MED ORDER — HEPARIN (PORCINE) IN NACL 1000-0.9 UT/500ML-% IV SOLN
INTRAVENOUS | Status: AC
  Filled 2021-04-25: qty 500

## 2021-04-25 MED ORDER — SODIUM CHLORIDE 0.9 % IV SOLN: INTRAVENOUS | Status: DC

## 2021-04-25 MED ORDER — BUPIVACAINE HCL (PF) 0.25 % IJ SOLN
INTRAMUSCULAR | Status: DC | PRN
  Administered 2021-04-25: 60 mL

## 2021-04-25 MED ORDER — LIDOCAINE 2% (20 MG/ML) 5 ML SYRINGE
INTRAMUSCULAR | Status: DC | PRN
  Administered 2021-04-25: 100 mg via INTRAVENOUS

## 2021-04-25 MED ORDER — ADENOSINE 6 MG/2ML IV SOLN
INTRAVENOUS | Status: DC | PRN
  Administered 2021-04-25: 12 mg via INTRAVENOUS

## 2021-04-25 MED ORDER — HEPARIN (PORCINE) IN NACL 1000-0.9 UT/500ML-% IV SOLN
INTRAVENOUS | Status: DC | PRN
  Administered 2021-04-25: 500 mL

## 2021-04-25 MED ORDER — ADENOSINE 6 MG/2ML IV SOLN
INTRAVENOUS | Status: AC
  Filled 2021-04-25: qty 4

## 2021-04-25 MED ORDER — LIDOCAINE HCL (PF) 1 % IJ SOLN: INTRAMUSCULAR | Status: DC | PRN

## 2021-04-25 MED ORDER — ACETAMINOPHEN 325 MG PO TABS: 650.0000 mg | ORAL_TABLET | ORAL | Status: DC | PRN

## 2021-04-25 MED ORDER — SODIUM CHLORIDE 0.9% FLUSH: 3.0000 mL | INTRAVENOUS | Status: DC | PRN

## 2021-04-25 MED ORDER — ISOPROTERENOL HCL 0.2 MG/ML IJ SOLN
INTRAMUSCULAR | Status: AC
  Filled 2021-04-25: qty 5

## 2021-04-25 MED ORDER — SODIUM CHLORIDE 0.9 % IV SOLN: 250.0000 mL | INTRAVENOUS | Status: DC | PRN

## 2021-04-25 MED ORDER — SODIUM CHLORIDE 0.9% FLUSH: 3.0000 mL | Freq: Two times a day (BID) | INTRAVENOUS | Status: DC

## 2021-04-25 MED ORDER — ISOPROTERENOL HCL 0.2 MG/ML IJ SOLN
INTRAVENOUS | Status: DC | PRN
  Administered 2021-04-25: 2 ug/min via INTRAVENOUS

## 2021-04-25 MED ORDER — PROPOFOL 10 MG/ML IV BOLUS
INTRAVENOUS | Status: DC | PRN
  Administered 2021-04-25: 20 mg via INTRAVENOUS
  Administered 2021-04-25: 30 mg via INTRAVENOUS
  Administered 2021-04-25 (×2): 20 mg via INTRAVENOUS
  Administered 2021-04-25: 30 mg via INTRAVENOUS

## 2021-04-25 MED ORDER — PROPOFOL 500 MG/50ML IV EMUL
INTRAVENOUS | Status: DC | PRN
  Administered 2021-04-25: 50 ug/kg/min via INTRAVENOUS
  Administered 2021-04-25: 75 ug/kg/min via INTRAVENOUS

## 2021-04-25 MED ORDER — FENTANYL CITRATE (PF) 250 MCG/5ML IJ SOLN
INTRAMUSCULAR | Status: DC | PRN
  Administered 2021-04-25: 100 ug via INTRAVENOUS

## 2021-04-25 MED ORDER — ACETAMINOPHEN 500 MG PO TABS
1000.0000 mg | ORAL_TABLET | Freq: Once | ORAL | Status: AC
  Administered 2021-04-25: 1000 mg via ORAL
  Filled 2021-04-25 (×2): qty 2

## 2021-04-25 SURGICAL SUPPLY — 14 items
BAG SNAP BAND KOVER 36X36 (MISCELLANEOUS) ×2 IMPLANT
CATH EZ STEER NAV 4MM D-F CUR (ABLATOR) ×2 IMPLANT
CATH JOSEPH QUAD ALLRED 6F REP (CATHETERS) ×4 IMPLANT
CATH WEB BI DIR CSDF CRV REPRO (CATHETERS) ×2 IMPLANT
CLOSURE PERCLOSE PROSTYLE (VASCULAR PRODUCTS) ×8 IMPLANT
MAT PREVALON FULL STRYKER (MISCELLANEOUS) ×2 IMPLANT
PACK EP LATEX FREE (CUSTOM PROCEDURE TRAY) ×2
PACK EP LF (CUSTOM PROCEDURE TRAY) ×1 IMPLANT
PAD PRO RADIOLUCENT 2001M-C (PAD) ×2 IMPLANT
PATCH CARTO3 (PAD) ×2 IMPLANT
SHEATH PINNACLE 6F 10CM (SHEATH) ×4 IMPLANT
SHEATH PINNACLE 7F 10CM (SHEATH) ×2 IMPLANT
SHEATH PINNACLE 8F 10CM (SHEATH) ×2 IMPLANT
SHEATH PROBE COVER 6X72 (BAG) ×2 IMPLANT

## 2021-04-25 NOTE — Telephone Encounter (Signed)
New message:     Patient has procedure today and she is very nerves and would like to know is there anything she could take. Please call patient.

## 2021-04-25 NOTE — Interval H&P Note (Signed)
History and Physical Interval Note:  04/25/2021 1:32 PM  Julie Rice  has presented today for surgery, with the diagnosis of svt.  The various methods of treatment have been discussed with the patient and family. After consideration of risks, benefits and other options for treatment, the patient has consented to  Procedure(s): SVT ABLATION (N/A) as a surgical intervention.  The patient's history has been reviewed, patient examined, no change in status, stable for surgery.  I have reviewed the patient's chart and labs.  Questions were answered to the patient's satisfaction.     Landy Mace [employer]

## 2021-04-25 NOTE — Discharge Instructions (Signed)
Post procedure care instructions No driving for 4 days. No lifting over 5 lbs for 1 week. No vigorous or sexual activity for 1 week. You may return to work/your usual activities on 05/04/21. Keep procedure site clean & dry. If you notice increased pain, swelling, bleeding or pus, call/return!  You may shower after 24 hours, but no soaking in baths/hot tubs/pools for 1 week.

## 2021-04-25 NOTE — Anesthesia Preprocedure Evaluation (Addendum)
Anesthesia Evaluation  Patient identified by MRN, date of birth, ID band Patient awake    Reviewed: Allergy & Precautions, H&P , NPO status , Patient's Chart, lab work & pertinent test results  History of Anesthesia Complications (+) PONV  Airway Mallampati: II   Neck ROM: full    Dental   Pulmonary shortness of breath, asthma , COPD,    breath sounds clear to auscultation       Cardiovascular hypertension, + CAD and +CHF  + dysrhythmias Atrial Fibrillation and Supra Ventricular Tachycardia  Rhythm:regular Rate:Normal     Neuro/Psych PSYCHIATRIC DISORDERS Anxiety Depression TIA Neuromuscular disease    GI/Hepatic hiatal hernia, GERD  ,  Endo/Other  Hypothyroidism   Renal/GU      Musculoskeletal  (+) Arthritis , Fibromyalgia -  Abdominal   Peds  Hematology   Anesthesia Other Findings   Reproductive/Obstetrics                             Anesthesia Physical Anesthesia Plan  ASA: 3  Anesthesia Plan: MAC   Post-op Pain Management:    Induction: Intravenous  PONV Risk Score and Plan: 3 and Ondansetron, Dexamethasone, Treatment may vary due to age or medical condition and Propofol infusion  Airway Management Planned: Simple Face Mask  Additional Equipment:   Intra-op Plan:   Post-operative Plan:   Informed Consent: I have reviewed the patients History and Physical, chart, labs and discussed the procedure including the risks, benefits and alternatives for the proposed anesthesia with the patient or authorized representative who has indicated his/her understanding and acceptance.     Dental advisory given  Plan Discussed with: CRNA, Anesthesiologist and Surgeon  Anesthesia Plan Comments:        Anesthesia Quick Evaluation

## 2021-04-25 NOTE — Telephone Encounter (Signed)
Attempted to return pt call. No answer, mailbox full, unable to leave message.

## 2021-04-25 NOTE — Transfer of Care (Signed)
Immediate Anesthesia Transfer of Care Note  Patient: Julie Rice  Procedure(s) Performed: SVT ABLATION  Patient Location: Cath Lab  Anesthesia Type:MAC  Level of Consciousness: drowsy and patient cooperative  Airway & Oxygen Therapy: Patient Spontanous Breathing  Post-op Assessment: Report given to RN and Post -op Vital signs reviewed and stable  Post vital signs: Reviewed and stable  Last Vitals:  Vitals Value Taken Time  BP 130/61 04/25/21 1644  Temp    Pulse 74 04/25/21 1644  Resp 16 04/25/21 1644  SpO2 98 % 04/25/21 1644  Vitals shown include unvalidated device data.  Last Pain:  Vitals:   04/25/21 1305  TempSrc:   PainSc: 3       Patients Stated Pain Goal: 3 (24/40/10 2725)  Complications: No notable events documented.

## 2021-04-25 NOTE — Anesthesia Postprocedure Evaluation (Signed)
Anesthesia Post Note  Patient: Julie Rice  Procedure(s) Performed: SVT ABLATION     Patient location during evaluation: PACU Anesthesia Type: MAC Level of consciousness: awake and alert Pain management: pain level controlled Vital Signs Assessment: post-procedure vital signs reviewed and stable Respiratory status: spontaneous breathing, nonlabored ventilation and respiratory function stable Cardiovascular status: blood pressure returned to baseline and stable Postop Assessment: no apparent nausea or vomiting Anesthetic complications: no   No notable events documented.  Last Vitals:  Vitals:   04/25/21 1645 04/25/21 1650  BP: 130/61 (!) 142/59  Pulse: 74 70  Resp: 16 (!) 9  Temp: 36.4 C   SpO2: 98% 97%    Last Pain:  Vitals:   04/25/21 1645  TempSrc: Temporal  PainSc: 0-No pain                 Pervis Hocking

## 2021-04-26 ENCOUNTER — Encounter (HOSPITAL_COMMUNITY): Payer: Self-pay | Admitting: Cardiology

## 2021-05-05 ENCOUNTER — Other Ambulatory Visit: Payer: Self-pay | Admitting: Cardiovascular Disease

## 2021-05-08 NOTE — Telephone Encounter (Signed)
Eliquis 5mg  refill request received. Patient is 85 years old, weight-71.7kg, Crea-0.88 on 04/05/2021, Diagnosis-Afib, and last seen by Dr. 06/05/2021 on 07/14/2020. Dose is appropriate based on dosing criteria. Will send in refill to requested pharmacy.

## 2021-05-11 ENCOUNTER — Other Ambulatory Visit (HOSPITAL_COMMUNITY): Payer: Self-pay

## 2021-05-15 DIAGNOSIS — H04123 Dry eye syndrome of bilateral lacrimal glands: Secondary | ICD-10-CM | POA: Diagnosis not present

## 2021-05-15 DIAGNOSIS — H01001 Unspecified blepharitis right upper eyelid: Secondary | ICD-10-CM | POA: Diagnosis not present

## 2021-05-15 DIAGNOSIS — H40013 Open angle with borderline findings, low risk, bilateral: Secondary | ICD-10-CM | POA: Diagnosis not present

## 2021-05-15 DIAGNOSIS — H353131 Nonexudative age-related macular degeneration, bilateral, early dry stage: Secondary | ICD-10-CM | POA: Diagnosis not present

## 2021-05-25 ENCOUNTER — Other Ambulatory Visit (HOSPITAL_COMMUNITY)
Admission: RE | Admit: 2021-05-25 | Discharge: 2021-05-25 | Disposition: A | Discharge status: Home / Self Care | Payer: Medicare Other | Source: Ambulatory Visit | Attending: Cardiology | Admitting: Cardiology

## 2021-05-25 DIAGNOSIS — Z20822 Contact with and (suspected) exposure to covid-19: Secondary | ICD-10-CM | POA: Diagnosis not present

## 2021-05-25 DIAGNOSIS — J9611 Chronic respiratory failure with hypoxia: Secondary | ICD-10-CM | POA: Diagnosis not present

## 2021-05-25 DIAGNOSIS — J449 Chronic obstructive pulmonary disease, unspecified: Secondary | ICD-10-CM | POA: Diagnosis not present

## 2021-05-25 DIAGNOSIS — Z01812 Encounter for preprocedural laboratory examination: Secondary | ICD-10-CM | POA: Insufficient documentation

## 2021-05-25 LAB — SARS CORONAVIRUS 2 (TAT 6-24 HRS): SARS Coronavirus 2: NEGATIVE

## 2021-05-28 ENCOUNTER — Other Ambulatory Visit: Payer: Self-pay | Admitting: *Deleted

## 2021-05-28 DIAGNOSIS — R0609 Other forms of dyspnea: Secondary | ICD-10-CM

## 2021-05-28 DIAGNOSIS — R0600 Dyspnea, unspecified: Secondary | ICD-10-CM

## 2021-05-29 ENCOUNTER — Ambulatory Visit (INDEPENDENT_AMBULATORY_CARE_PROVIDER_SITE_OTHER): Payer: Medicare Other | Admitting: Internal Medicine

## 2021-05-29 ENCOUNTER — Encounter: Payer: Self-pay | Admitting: Internal Medicine

## 2021-05-29 ENCOUNTER — Other Ambulatory Visit: Payer: Self-pay

## 2021-05-29 ENCOUNTER — Ambulatory Visit: Payer: Medicare Other | Admitting: Cardiology

## 2021-05-29 ENCOUNTER — Ambulatory Visit: Payer: Medicare Other | Admitting: Internal Medicine

## 2021-05-29 DIAGNOSIS — R0600 Dyspnea, unspecified: Secondary | ICD-10-CM | POA: Diagnosis not present

## 2021-05-29 DIAGNOSIS — R0609 Other forms of dyspnea: Secondary | ICD-10-CM

## 2021-05-29 LAB — PULMONARY FUNCTION TEST
DL/VA % pred: 91 %
DL/VA: 3.74 ml/min/mmHg/L
DLCO cor % pred: 81 %
DLCO cor: 14.17 ml/min/mmHg
DLCO unc % pred: 74 %
DLCO unc: 12.89 ml/min/mmHg
FEF 25-75 Post: 1.86 L/sec
FEF 25-75 Pre: 0.93 L/sec
FEF2575-%Change-Post: 99 %
FEF2575-%Pred-Post: 163 %
FEF2575-%Pred-Pre: 81 %
FEV1-%Change-Post: 18 %
FEV1-%Pred-Post: 91 %
FEV1-%Pred-Pre: 77 %
FEV1-Post: 1.51 L
FEV1-Pre: 1.27 L
FEV1FVC-%Change-Post: 5 %
FEV1FVC-%Pred-Pre: 101 %
FEV6-%Change-Post: 12 %
FEV6-%Pred-Post: 91 %
FEV6-%Pred-Pre: 81 %
FEV6-Post: 1.92 L
FEV6-Pre: 1.72 L
FEV6FVC-%Pred-Post: 106 %
FEV6FVC-%Pred-Pre: 106 %
FVC-%Change-Post: 12 %
FVC-%Pred-Post: 85 %
FVC-%Pred-Pre: 76 %
FVC-Post: 1.92 L
FVC-Pre: 1.72 L
Post FEV1/FVC ratio: 79 %
Post FEV6/FVC ratio: 100 %
Pre FEV1/FVC ratio: 74 %
Pre FEV6/FVC Ratio: 100 %
RV % pred: 111 %
RV: 2.62 L
TLC % pred: 94 %
TLC: 4.47 L

## 2021-05-29 NOTE — Assessment & Plan Note (Addendum)
PFTs 04/22/2019 min airflow obst  Chest CTa  08/17/2019 No evidence of aortic aneurysm or dissection. No evidence of pulmonary embolus. Moderate bilateral pleural effusions. Compressive atelectasis in the lower lobes.  Scattered reticulonodular densities and ground-glass nodular densities throughout the lungs are stable. Dilated, fluid-filled esophagus is stable. Echo 08/19/2019 1. Left ventricular ejection fraction, by visual estimation, is 25 to 30%. The left ventricle has severely decreased function. There is no left ventricular hypertrophy.  2. Global right ventricle has normal systolic function.The right ventricular size is normal.  3. Left atrial size was severely dilated.  4. Right atrial size was moderately dilated.  Amiodarone started 08/23/2019  - 09/07/2019   Walked RA x one lap =  approx 250 ft - stopped due to  Sob with sats 100% -  04/11/2021   Walked RA  2 laps @ approx 239ft each @ slow cane  pace  stopped due to end of study, no desats, no sob but"tired"  - PFT's  05/29/2021  FEV1 1.51 (91 % ) ratio 0.79  p 18 % improvement from saba p 0 prior to study with DLCO  12.89 (74%) corrects to 3.74 (90%)  for alv volume and FV curve min airflow obst and ERV  41%  @ wt  168    Although she meets criteria for an asthmatic component, I doubt this is primary airway dz and may just as likely be a component of cardiac asthma so rec continued efforts to optimize filling pressures and in meantime ok to use saba prn as long as use remains relatively low  Re saba: I spent extra time with pt today reviewing appropriate use of albuterol for prn use on exertion with the following points: 1) saba is for relief of sob that does not improve by walking a slower pace or resting but rather if the pt does not improve after trying this first. 2) If the pt is convinced, as many are, that saba helps recover from activity faster then it's easy to tell if this is the case by re-challenging : ie stop, take the  inhaler, then p 5 minutes try the exact same activity (intensity of workload) that just caused the symptoms and see if they are substantially diminished or not after saba 3) if there is an activity that reproducibly causes the symptoms, try the saba 15 min before the activity on alternate days   If in fact the saba really does help, then fine to continue to use it prn but advised may need to look closer at the maintenance regimen being used (right now she declines but advised on reasons she might want to reconsider)  to achieve better control of airways disease with exertion.   Pulmonary f/u can be prn          Each maintenance medication was reviewed in detail including emphasizing most importantly the difference between maintenance and prns and under what circumstances the prns are to be triggered using an action plan format where appropriate.  Total time for H and P, chart review, counseling, reviewing hfa device(s) and generating customized AVS unique to this summary final  office visit / same day charting  > 30 min

## 2021-05-29 NOTE — Progress Notes (Signed)
Subjective:     Patient ID: Julie Rice, female   DOB: February 01, 1936,   MRN: [id number]    Brief patient profile:  86  yowf never smoker dx RA followed by Julie Rice on nsaids with new doe around 2000 eval by Dr Julie Rice: inhaler helped some but never back to baseline then rx by [facility] 08/02/16 LAPAROSCOPIC REPAIR OF LARGE  HIATAL HERNIA LAPAROSCOPIC NISSEN FUNDOPLICATION >>  and dysphagia ever since with eval Julie Rice 02/19/17 EGD dilated benign esoph stenosis and improved but  new cough /gag/vomit around Nov 04 2017 then abrupt pain under R breast x around 12/20/17 then cxr 12/22/17 c/w pna with cavitary changes on CT 12/25/17 so referred to pulmonary clinic 12/31/2017 by Dr  Julie Rice p rx with augmentin starting 12/25/17 for possible lung abscess.     History of Present Illness  12/31/2017 1st [facility] office visit/ Julie Rice   Chief Complaint  Patient presents with   Pulmonary Consult    Referred by Dr. Amador Rice. Pt c/o cough since Jan 2019. She states she has had SOB off and on for the past several yrs. She was recently dxed with PNA. Her cough is non prod and worse at night.   since abx started R side CP  improved, cough still a problem > beige esp at hs / still gag/ vomit with overt HB not on ppi daily ac Lower molar R dental work one month prior to onset of cp Sleeps on L side typically not on back  rec Augmentin 875 mg take one pill twice daily  X 10 days   Protonix 20 mg x 2 (40 mg called in)   Take 30- 60 min before your first and last meals of the day  GERD  diet Take delsym two tsp every 12 hours and supplement if needed with  tramadol 50 mg up to 1 every 4 hours     01/12/2018  f/u ov/Julie Rice re: lung abscess  "I'm no better at all"  Chief Complaint  Patient presents with   Follow-up    SOB with activity and at rest, non-productive cough, chest pain bilateral sides behind breast,   Dyspnea:  No better Cough: no sputum production  Sleep: tramadol works well / only taking twice  daily (rec was for up to 1 q4)  Cp now 2/10 only feels it with deep breath  rec Take delsym two tsp every 12 hours and supplement if needed with  tramadol 50 mg up to 1 every 4 hours to suppress the urge to cough. Swallowing water or using ice chips/non mint and menthol containing candies (such as lifesavers or sugarless jolly ranchers) are also effective.  You should rest your voice and avoid activities that you know make you cough. Once you have eliminated the cough for 3 straight days try reducing the tramadol first,  then the delsym as tolerated. Please remember to go to the  x-ray department downstairs in the basement  for your tests - we will call you with the results when they are available. Clindamycin 150 mg four times a day x 10 days and then return with cxr      Add:  Changed to 300 mg tid and advised by phone     02/05/2018  f/u ov/Julie Rice re: final f/u re lung abscess from ? Asp pna  Chief Complaint  Patient presents with   Follow-up    ER VISIT 3.31.19, NON PRODUCTIVE COUGH  R cp leveled off  Now can barely feel  it with the deepest breath Sob the same  At rest and worse walking  Cough is improved but still waking up with it and also some daytime but dry  N and V ? From clindamycin  Coughed so hard on 01/30/18 started to hurt L chest > to ER 02/01/18  New med from ER = zofran > eating and drinking now  Last clindamycin about a week prior to OV  rec Please see Dr Julie Rice as soon as possible as I suspect your swallowing problem caused your lung problem which is slowly healing Stop your clindamycin  You will need a follow up cxr in 3 months - fine to let Dr Julie Bouchard do this  - return here if cough or pain with breathing gets worse with all active medications in hand including the over the counter ones     Admit date: 08/16/2019 Discharge date: 08/24/2019   Recommendations for Outpatient Follow-up:  Eat only sitting up at 90 degrees. Follow up with PCP in 7-10 days. Chemistry to be  drawn in 7 days and reported to PCP. Patient to follow up with Dr. Eden Rice within 2 weeks. Patient to follow up with Dr. Chales Rice within one month.     Discharge Diagnoses: Principal diagnosis is #1 Atrial fibrillation with RVR Chest Pain Intractable nausea and vomiting Elevated troponin Hypothyroidism Dyslipidemia Anemia Hypertension GERD Prolonged QT interval History of systolic CHF Asthma   Discharge Condition: Fair   Disposition: Home   Diet recommendation: regular        Filed Weights    08/20/19 0410 08/21/19 0443 08/22/19 0559  Weight: 65.3 kg 64.6 kg 64.7 kg      History of present illness:  Julie Rice looks to have been seen very remotely (1990's) by Dr. Eden Rice and not since then.  Recently referred back for some reports of CP and palpitations, though presented  to the hospital last month prior  To her appointment date.  She was noted to have new reduced LVEF 40%, CHF, was in SR w/PVCs.  Cardiology noted she had a 4 day monitor via her PMD noting intermittent runs of SVT with the longest being 13 beats.  She was diuresed, underwent R/LHC Widely patent coronary arteries, Low right heart and LV filling pressures During her stay she had an SVT that responded to adenosine She was discharged 07/27/2019   She was re-admitted 08/07/2019 with SOB, hypoxic resp failure, found with aspiration pneumonia. Barium swallow study showed significant narrowing of the upper part of the esophagus but no esophageal stricture.  Underwent EGD with finding of benign-appearing esophageal stenosis. Status post fundoplication, Botox injection, balloon dilation.  I do not see that she had any cardiac issues during this hospitalization   Admitted to [facility] this admission 08/16/2019 with c/o on/off CP, HHRN noted pulse irregular, ranging 60-140 and recommended she go to the ER.  She was found in AFib w/RVR New echo this admission noted EF up some to 40-45%, initially placed on dilt though changed to BB, and  heparin gtt for a/c Looks like she is planned for TEE/DCCV, though ? If pending GI 1st given her history and ongoing nausea, as well as any anticoagulation recommendations, concerns, for final anticoagulation plan   EP is asked to weigh in on AAD options, noting severely dilated LA, likely to need drug to help maintain SR.  Rhythm control preferred strategy with reduced LVEF ad recent CHF exacerbation.   Hospital Course:  84 y.o. female with medical history significant of hypertension,  hyperlipidemia, systolic CHF, fibromyalgia, anemia, esophageal stricture s/p balloon dilation, large symptomatic hiatal hernia status post fundoplication, chronic dysphagia, asthma/COPD on oxygen at 2 L at home, CAD, TIA, bipolar disorder fibromyalgia, rheumatoid arthritis, TIA, chronic urinary incontinence.     She presented with heart rate 60s-140 at home per home health nurse.  Patient has not filled her medications since her discharge with still having some intermittent chest pain and she was told to come to emergency department.  She reports while hospitalized she has an episode of tachycardia with HR p to 170's she was given a medicine and then her HR has come down.  She has no known hx of A.fib  She took 2 nitro and 3/24 of aspirin and presented to emergency department.  Chest pain report on and off for weeks, is worse with swallowing.  Has been swallowing soft food ok.  Reported burning pain in her chest radiating to her back and arms.  She felt very weak and fell back wards no LOC.  No fever no chills.  Last night she felt sweaty her temp was 99.9.  Reports Shortness of breath since last summer but has been getting progressively worse.     Relative recent history: Initially admitted on 10/3/20200 with shortness of breath and chest pain was found to have lower lobe pneumonia thought to be secondary to aspiration COVID negative treated with broad-spectrum antibiotics there was worrisome for esophageal stenosis GI  was consulted and she underwent balloon dilatation and Botox injection Physical therapy seen her and recommended home health and discharge and she was discharged home on 9 October.   EKG showed no evidence of ischemic changes thought to be secondary to demand ischemia.       04/11/2021  f/u ov/Luzmaria Devaux re: chf/ uacs  Still on amiodarone no longer entrestro Chief Complaint  Patient presents with   Shortness of Breath    Dyspnea for several weeks; patient states cardiac ablation scheduled soon and here for pulmonary clearance  Dyspnea: mb and back / flat stops on return with sats around 91-92% which is an improvement  Cough: none  Sleeping: on L side on 30 degrees wedge  SABA use: seems to need in am 4 x weekly  02: 2lpm hs  Covid status:    JJ Only use your albuterol as a rescue medication   Also ok to Try albuterol 15 min before an activity (on alternating days)  that you know would make you short of breath    05/29/2021  f/u ov/Joson Sapp re: doe with small asthma component Chief Complaint  Patient presents with   Shortness of Breath    Describes mild dyspnea with walking to her mailbox and some bilateral ankle edema. Reports using lasix as directed and has follow up with cardiologist next week.    Dyspnea:  still doing mb and back  Cough: none Sleeping: 30 degree wedge  SABA use: albuterol p rising most morning  02: 2lpm hs prn      No obvious day to day or daytime variability or assoc excess/ purulent sputum or mucus plugs or hemoptysis or cp or chest tightness, subjective wheeze or overt sinus or hb symptoms.   2  without nocturnal  or early am exacerbation  of respiratory  c/o's or need for noct saba. Also denies any obvious fluctuation of symptoms with weather or environmental changes or other aggravating or alleviating factors except as outlined above   No unusual exposure hx or h/o childhood pna/ asthma or  knowledge of premature birth.  Current Allergies, Complete Past Medical  History, Past Surgical History, Family History, and Social History were reviewed in [employer] record.  ROS  The following are not active complaints unless bolded Hoarseness, sore throat, dysphagia, dental problems, itching, sneezing,  nasal congestion or discharge of excess mucus or purulent secretions, ear ache,   fever, chills, sweats, unintended wt loss or wt gain, classically pleuritic or exertional cp,  orthopnea pnd or arm/hand swelling  or leg swelling, presyncope, palpitations, abdominal pain, anorexia, nausea, vomiting, diarrhea  or change in bowel habits or change in bladder habits, change in stools or change in urine, dysuria, hematuria,  rash, arthralgias, visual complaints, headache, numbness, weakness or ataxia or problems with walking or coordination,  change in mood or  memory.        Current Meds  Medication Sig   acetaminophen (TYLENOL) 500 MG tablet Take 1,000 mg by mouth every 6 (six) hours as needed for moderate pain, headache or fever.   albuterol (VENTOLIN HFA) 108 (90 Base) MCG/ACT inhaler TAKE 2 PUFFS BY MOUTH EVERY 6 HOURS AS NEEDED FOR WHEEZE OR SHORTNESS OF BREATH   amiodarone (PACERONE) 200 MG tablet Take 1 tablet (200 mg total) by mouth twice daily for 2 weeks, then 1 tablet daily.   Carboxymethylcellulose Sodium (REFRESH TEARS OP) Place 1 drop into both eyes 2 (two) times daily.   cetirizine (ZYRTEC) 10 MG tablet Take 10 mg by mouth daily as needed for allergies.   clonazePAM (KLONOPIN) 0.5 MG tablet TAKE 1 TABLET BY MOUTH TWICE A DAY AS NEEDED FOR ANXIETY   DULoxetine (CYMBALTA) 60 MG capsule TAKE 1 CAPSULE BY MOUTH EVERY DAY   ELIQUIS 5 MG TABS tablet TAKE 1 TABLET BY MOUTH TWICE A DAY   furosemide (LASIX) 40 MG tablet TAKE 1 TABLET (40 MG TOTAL) BY MOUTH DAILY AS NEEDED FOR FLUID OR EDEMA.   gabapentin (NEURONTIN) 300 MG capsule Take 1 capsule (300 mg total) by mouth 3 (three) times daily.   metoprolol tartrate (LOPRESSOR) 50 MG tablet Take  1 tablet (50 mg total) by mouth 2 (two) times daily.   Multiple Vitamins-Minerals (ONE-A-DAY WOMENS 50+) TABS Take 1 tablet by mouth daily.   omeprazole (PRILOSEC) 20 MG capsule TAKE 1 CAPSULE BY MOUTH EVERY DAY   sodium chloride (OCEAN) 0.65 % SOLN nasal spray Place 1 spray into both nostrils as needed for congestion.                                    Objective:   Physical Exam   05/29/2021       168  04/11/2021        162  10/19/2019     140   09/07/2019        136  02/05/2018         152  01/12/2018        159   12/31/17 160 lb 9.6 oz (72.8 kg)  12/29/17 162 lb (73.5 kg)  12/22/17 164 lb 4 oz (74.5 kg)    Vital signs reviewed  05/29/2021  - Note at rest 02 sats  97% on RA   General appearance:    pleasant amb bf nad    HEENT : pt wearing mask not removed for exam due to covid -19 concerns.    NECK :  without JVD/Nodes/TM/ nl carotid upstrokes bilaterally   LUNGS: no  acc muscle use,  Nl contour chest which is clear to A and P bilaterally without cough on insp or exp maneuvers   CV:  RRR  no s3 or murmur or increase in P2, and 1+ pitting R,  trace L LE  ABD:  soft and nontender with nl inspiratory excursion in the supine position. No bruits or organomegaly appreciated, bowel sounds nl  MS:  Nl gait/ ext warm without deformities, calf tenderness, cyanosis or clubbing No obvious joint restrictions   SKIN: warm and dry without lesions    NEURO:  alert, approp, nl sensorium with  no motor or cerebellar deficits apparent.              Assessment:

## 2021-05-29 NOTE — Patient Instructions (Signed)
Full PFT performed today. °

## 2021-05-29 NOTE — Progress Notes (Signed)
Full PFT performed today. °

## 2021-05-29 NOTE — Patient Instructions (Signed)
  Only use your albuterol inhaler as a rescue medication to be used if you can't catch your breath by resting or doing a relaxed purse lip breathing pattern.  - The less you use it, the better it will work when you need it. - Ok to use the inhaler up to 2 puffs  every 4 hours if you must but call for appointment if use goes up over your usual need - Don't leave home without it !!  (think of it like the spare tire for your car)   Also ok to Try albuterol 15 min before an activity (on alternating days)  that you know usually  would make you short of breath and see if it makes any difference and if makes none then don't take albuterol after activity unless you can't catch your breath as this means it's the resting that helps, not the albuterol.   Pulmonary follow up is as needed

## 2021-06-11 DIAGNOSIS — L820 Inflamed seborrheic keratosis: Secondary | ICD-10-CM | POA: Diagnosis not present

## 2021-06-11 DIAGNOSIS — L821 Other seborrheic keratosis: Secondary | ICD-10-CM | POA: Diagnosis not present

## 2021-06-11 DIAGNOSIS — D485 Neoplasm of uncertain behavior of skin: Secondary | ICD-10-CM | POA: Diagnosis not present

## 2021-06-12 ENCOUNTER — Telehealth: Payer: Self-pay | Admitting: Cardiovascular Disease

## 2021-06-12 NOTE — Telephone Encounter (Signed)
Spoke with pt who complains of increased bilateral edema in feet and ankles over the last 2 weeks.  Pt states she can only wear flip flops.  She denies current CP, weight gain, new SOB or dizziness/fainting.  She states she was walking up her outside steps Saturday and hit her left foot on one of the steps.  This turned her toes black with bruising.  She states the top of her left foot is very red and has had some weeping.  Pt has been elevating feet.  Swelling does go down some at night. Pt advised due to injury of left foot and possible infectious/inflammatory process recommend pt contact her PCP for further evaluation and recommendation.   Pt has post op ablation follow up 06/19/2021 with Dr Elberta Fortis. Will also forward to Dr Eden Emms to make him aware and provide any additional recommendations re: edema. Pt verbalizes understanding and agrees with current plan.

## 2021-06-12 NOTE — Telephone Encounter (Signed)
Pt c/o swelling: STAT is pt has developed SOB within 24 hours  If swelling, where is the swelling located? Feet and ankles   How much weight have you gained and in what time span? Patient said her weight fluctuates. She actually lost a pound since yesterday  Have you gained 3 pounds in a day or 5 pounds in a week?   Do you have a log of your daily weights (if so, list)? no  Are you currently taking a fluid pill? yes  Are you currently SOB?   Have you traveled recently? No Patient said her feet have been swelling for about two weeks. They are not bad when she wakes up in the morning, but get worse as the day goes on, especially if she does a lot of house chores  Patient said her toes are almost black and the tops of her feet are red, almost like a bee sting.  The patient saw a Dermatologist yesterday who recommended she follow up with Dr. Eden Emms

## 2021-06-13 DIAGNOSIS — M79672 Pain in left foot: Secondary | ICD-10-CM | POA: Diagnosis not present

## 2021-06-13 DIAGNOSIS — I10 Essential (primary) hypertension: Secondary | ICD-10-CM | POA: Diagnosis not present

## 2021-06-13 DIAGNOSIS — I4891 Unspecified atrial fibrillation: Secondary | ICD-10-CM | POA: Diagnosis not present

## 2021-06-19 ENCOUNTER — Other Ambulatory Visit: Payer: Self-pay

## 2021-06-19 ENCOUNTER — Encounter: Payer: Self-pay | Admitting: Cardiology

## 2021-06-19 ENCOUNTER — Ambulatory Visit: Payer: Medicare Other | Admitting: Cardiology

## 2021-06-19 VITALS — BP 138/68 | HR 60 | Ht 61.5 in | Wt 170.0 lb

## 2021-06-19 DIAGNOSIS — I471 Supraventricular tachycardia: Secondary | ICD-10-CM | POA: Diagnosis not present

## 2021-06-19 NOTE — Patient Instructions (Signed)
  Medication Instructions:  Your physician has recommended you make the following change in your medication:  STOP Amiodarone  *If you need a refill on your cardiac medications before your next appointment, please call your pharmacy*   Lab Work: None ordered   Testing/Procedures: None ordered   Follow-Up: At [facility], you and your health needs are our priority.  As part of our continuing mission to provide you with exceptional heart care, we have created designated Provider Care Teams.  These Care Teams include your primary Cardiologist (physician) and Advanced Practice Providers (APPs -  Physician Assistants and Nurse Practitioners) who all work together to provide you with the care you need, when you need it.  Your next appointment:   3 month(s)  The format for your next appointment:   In Person  Provider:   Loman Brooklyn, MD    Thank you for choosing [facility]!!   Dory Horn, RN [phone]   Other Instructions

## 2021-06-19 NOTE — Progress Notes (Signed)
Electrophysiology Office Note   Date:  06/19/2021   ID:  Julie Rice, DOB April 10, 1936, MRN [id number]  PCP:  Shirline Frees, NP  Cardiologist:  Eden Emms Primary Electrophysiologist:  Verdis Koval [name], MD    Chief Complaint: SVT   History of Present Illness: Julie Rice is a 85 y.o. female who is being seen today for the evaluation of SVT at the request of Shirline Frees, NP. Presenting today for electrophysiology evaluation.  She has a history of nonobstructive coronary artery disease, chronic systolic heart failure with improved ejection fraction, fibromyalgia, hypertension, atrial fibrillation, TIA, COPD, esophageal strictures.  She presented to the hospital 04/03/2021 with sudden onset chest pain and palpitations.  She was found to be in a wide-complex tachycardia with rates in the 170s to 180s.  This was unresponsive to Cardizem.  She was cardioverted with restoration of sinus rhythm.  She reverted to wide-complex tachycardia within an hour requiring repeat cardioversion and amiodarone.  She was taken for EP study and ablation 04/25/2021.  She had a narrow complex tachycardia with a cycle length of 375 ms.  She was given adenosine which terminated tachycardia.  Ablation was performed for ORT at the floor of the coronary sinus.  Today, she denies symptoms of palpitations, chest pain, shortness of breath, orthopnea, PND, lower extremity edema, claudication, dizziness, presyncope, syncope, bleeding, or neurologic sequela. The patient is tolerating medications without difficulties.  She has noted no further palpitations.  She has no chest pain or shortness of breath.  Unfortunately she had a fall with significant bruising to both of her legs.  She has some edema in her legs.  She says that it is better than it was a few days after her fall.  She fell approximately 10 days ago.  She Julie Rice call her primary physician if it does not continue to improve by the end of the week.   Past  Medical History:  Diagnosis Date   A-fib (HCC)    Anemia    Anxiety    Bipolar disorder (HCC)    Blood transfusion 1991   autologous pts own blood given    CAD (coronary artery disease)    Cataract    Chest pain    "@ rest, lying down, w/exertion"   CHF (congestive heart failure) (HCC) 07/2020   Chronic back pain    "mostly lower back but I do have upper back pain regularly" (04/06/2018)   COPD (chronic obstructive pulmonary disease) (HCC)    Depression    Esophageal stricture    Fibromyalgia    GERD (gastroesophageal reflux disease)    Heart murmur    "slight" (04/06/2018)   Hiatal hernia    Hyperlipidemia    Patient denies   Hypertension    Internal hemorrhoids    Lumbago    Mitral regurgitation    Osteoarthritis    Osteoporosis    Pneumonia ~ 01/2018   PONV (postoperative nausea and vomiting)    severe ponv, "in the past" (04/06/2018)   Rectal bleeding    Rheumatoid arthritis (HCC)    Spondylosis    TIA (transient ischemic attack) 2013   Urinary incontinence    wears depends   Past Surgical History:  Procedure Laterality Date   ABDOMINAL HYSTERECTOMY  1982   BACK SURGERY     BALLOON DILATION N/A 09/21/2018   Procedure: BALLOON DILATION;  Surgeon: Hilarie Fredrickson, MD;  Location: [facility] ENDOSCOPY;  Service: Endoscopy;  Laterality: N/A;   BALLOON DILATION N/A 08/12/2019  Procedure: BALLOON DILATION;  Surgeon: Lynann Bologna, MD;  Location: [facility] ENDOSCOPY;  Service: Endoscopy;  Laterality: N/A;   BIOPSY  08/12/2019   Procedure: BIOPSY;  Surgeon: Lynann Bologna, MD;  Location: [facility] ENDOSCOPY;  Service: Endoscopy;;   BLADDER SUSPENSION  1980's   BOTOX INJECTION N/A 09/21/2018   Procedure: BOTOX INJECTION;  Surgeon: Hilarie Fredrickson, MD;  Location: [facility] ENDOSCOPY;  Service: Endoscopy;  Laterality: N/A;   BOTOX INJECTION N/A 08/12/2019   Procedure: BOTOX INJECTION;  Surgeon: Lynann Bologna, MD;  Location: [facility] ENDOSCOPY;  Service: Endoscopy;  Laterality: N/A;   CATARACT EXTRACTION W/  INTRAOCULAR LENS  IMPLANT, BILATERAL Bilateral 2010   DILATION AND CURETTAGE OF UTERUS  1961   ESOPHAGEAL MANOMETRY N/A 03/25/2018   Procedure: ESOPHAGEAL MANOMETRY (EM);  Surgeon: Napoleon Form, MD;  Location: [facility] ENDOSCOPY;  Service: Endoscopy;  Laterality: N/A;   ESOPHAGOGASTRODUODENOSCOPY (EGD) WITH PROPOFOL N/A 04/07/2018   Procedure: ESOPHAGOGASTRODUODENOSCOPY (EGD) WITH PROPOFOL;  Surgeon: Sherrilyn Rist, MD;  Location: [facility] ENDOSCOPY;  Service: Gastroenterology;  Laterality: N/A;   ESOPHAGOGASTRODUODENOSCOPY (EGD) WITH PROPOFOL N/A 09/21/2018   Procedure: ESOPHAGOGASTRODUODENOSCOPY (EGD) WITH PROPOFOL;  Surgeon: Hilarie Fredrickson, MD;  Location: [facility] ENDOSCOPY;  Service: Endoscopy;  Laterality: N/A;   ESOPHAGOGASTRODUODENOSCOPY (EGD) WITH PROPOFOL N/A 08/12/2019   Procedure: ESOPHAGOGASTRODUODENOSCOPY (EGD) WITH PROPOFOL;  Surgeon: Lynann Bologna, MD;  Location: [facility] ENDOSCOPY;  Service: Endoscopy;  Laterality: N/A;   HERNIA REPAIR     HIATAL HERNIA REPAIR N/A 08/02/2016   Procedure: LAPAROSCOPIC REPAIR OF LARGE  HIATAL HERNIA;  Surgeon: Glenna Fellows, MD;  Location: [facility] ORS;  Service: General;  Laterality: N/A;   JOINT REPLACEMENT     LAPAROSCOPIC NISSEN FUNDOPLICATION N/A 08/02/2016   Procedure: LAPAROSCOPIC NISSEN FUNDOPLICATION;  Surgeon: Glenna Fellows, MD;  Location: [facility] ORS;  Service: General;  Laterality: N/A;   LUMBAR LAMINECTOMY  1990; 1994; [id number]   "I've got 2 stainless steel rods; 6 screws; 2 ray cages"took bone from right hip to put in back   RIGHT/LEFT HEART CATH AND CORONARY ANGIOGRAPHY N/A 07/26/2019   Procedure: RIGHT/LEFT HEART CATH AND CORONARY ANGIOGRAPHY;  Surgeon: Lyn Records, MD;  Location: [facility] INVASIVE CV LAB;  Service: Cardiovascular;  Laterality: N/A;   SVT ABLATION N/A 04/25/2021   Procedure: SVT ABLATION;  Surgeon: Regan Lemming, MD;  Location: [facility] INVASIVE CV LAB;  Service: Cardiovascular;  Laterality: N/A;   TEE WITHOUT CARDIOVERSION N/A 08/19/2019    Procedure: TRANSESOPHAGEAL ECHOCARDIOGRAM (TEE);  Surgeon: Lewayne Bunting, MD;  Location: [facility] ENDOSCOPY;  Service: Cardiovascular;  Laterality: N/A;   TONSILLECTOMY AND ADENOIDECTOMY  1945   TOTAL KNEE ARTHROPLASTY Right ~ 1996   TUBAL LIGATION  ~ 1976     Current Outpatient Medications  Medication Sig Dispense Refill   acetaminophen (TYLENOL) 500 MG tablet Take 1,000 mg by mouth every 6 (six) hours as needed for moderate pain, headache or fever.     albuterol (VENTOLIN HFA) 108 (90 Base) MCG/ACT inhaler TAKE 2 PUFFS BY MOUTH EVERY 6 HOURS AS NEEDED FOR WHEEZE OR SHORTNESS OF BREATH 8 g 2   Carboxymethylcellulose Sodium (REFRESH TEARS OP) Place 1 drop into both eyes 2 (two) times daily.     cetirizine (ZYRTEC) 10 MG tablet Take 10 mg by mouth daily as needed for allergies.     clonazePAM (KLONOPIN) 0.5 MG tablet TAKE 1 TABLET BY MOUTH TWICE A DAY AS NEEDED FOR ANXIETY 60 tablet 2   DULoxetine (CYMBALTA) 60 MG capsule TAKE 1 CAPSULE BY MOUTH EVERY  DAY 90 capsule 3   ELIQUIS 5 MG TABS tablet TAKE 1 TABLET BY MOUTH TWICE A DAY 180 tablet 1   furosemide (LASIX) 40 MG tablet TAKE 1 TABLET (40 MG TOTAL) BY MOUTH DAILY AS NEEDED FOR FLUID OR EDEMA. 90 tablet 3   gabapentin (NEURONTIN) 300 MG capsule Take 1 capsule (300 mg total) by mouth 3 (three) times daily. 270 capsule 1   metoprolol tartrate (LOPRESSOR) 50 MG tablet Take 1 tablet (50 mg total) by mouth 2 (two) times daily. 60 tablet 11   Multiple Vitamins-Minerals (ONE-A-DAY WOMENS 50+) TABS Take 1 tablet by mouth daily.     omeprazole (PRILOSEC) 20 MG capsule TAKE 1 CAPSULE BY MOUTH EVERY DAY 90 capsule 3   sodium chloride (OCEAN) 0.65 % SOLN nasal spray Place 1 spray into both nostrils as needed for congestion.     No current facility-administered medications for this visit.    Allergies:   Tape, Tramadol, and Morphine   Social History:  The patient  reports that she has never smoked. She has never used smokeless tobacco. She reports  that she does not drink alcohol and does not use drugs.   Family History:  The patient's family history includes Heart attack in her brother; Heart disease in her brother; Heart disease (age of onset: 56) in her father; Hypertension in her mother; Kidney disease in her mother; Suicidality in her son.    ROS:  Please see the history of present illness.   Otherwise, review of systems is positive for none.   All other systems are reviewed and negative.    PHYSICAL EXAM: VS:  BP 138/68   Pulse 60   Ht 5' 1.5" (1.562 m)   Wt 170 lb (77.1 kg)   BMI 31.60 kg/m  , BMI Body mass index is 31.6 kg/m. GEN: Well nourished, well developed, in no acute distress  HEENT: normal  Neck: no JVD, carotid bruits, or masses Cardiac: RRR; no murmurs, rubs, or gallops,no edema  Respiratory:  clear to auscultation bilaterally, normal work of breathing GI: soft, nontender, nondistended, + BS MS: no deformity or atrophy  Skin: warm and dry Neuro:  Strength and sensation are intact Psych: euthymic mood, full affect  EKG:  EKG is ordered today. Personal review of the ekg ordered shows sinus rhythm, rate 60  Recent Labs: 02/12/2021: Brain Natriuretic Peptide 89 04/04/2021: Magnesium 2.0; TSH 1.588 04/05/2021: BUN 14; Creatinine, Ser 0.88; Hemoglobin 10.8; Platelets 234; Potassium 4.7; Sodium 135    Lipid Panel     Component Value Date/Time   CHOL 172 03/09/2020 0936   TRIG 111.0 03/09/2020 0936   HDL 65.60 03/09/2020 0936   CHOLHDL 3 03/09/2020 0936   VLDL 22.2 03/09/2020 0936   LDLCALC 84 03/09/2020 0936   LDLDIRECT 122.2 06/01/2013 0944     Wt Readings from Last 3 Encounters:  06/19/21 170 lb (77.1 kg)  05/29/21 168 lb (76.2 kg)  04/25/21 158 lb (71.7 kg)      Other studies Reviewed: Additional studies/ records that were reviewed today include: TTE 04/04/21  Review of the above records today demonstrates:   1. Left ventricular ejection fraction, by estimation, is 60 to 65%. The  left  ventricle has normal function. Left ventricular endocardial border  not optimally defined to evaluate regional wall motion. There is mild left  ventricular hypertrophy. Left  ventricular diastolic parameters are consistent with Grade I diastolic  dysfunction (impaired relaxation).   2. Right ventricular systolic function is normal. The  right ventricular  size is normal. There is normal pulmonary artery systolic pressure. The  estimated right ventricular systolic pressure is 29.0 mmHg.   3. The mitral valve is degenerative. Trivial mitral valve regurgitation.  No evidence of mitral stenosis. Moderate mitral annular calcification.   4. The aortic valve is normal in structure. There is mild calcification  of the aortic valve. Aortic valve regurgitation is mild. No aortic  stenosis is present.   5. The inferior vena cava is normal in size with greater than 50%  respiratory variability, suggesting right atrial pressure of 3 mmHg.   ASSESSMENT AND PLAN:  1.  SVT: Found to have ORT with ablation performed for coronary sinus.  Is currently on both metoprolol and amiodarone.  High risk medication monitoring.  We Julie Rice stop amiodarone today.  2.  Nonobstructive coronary artery disease: No current chest pain.  3.  Hypertension: Well-controlled  Current medicines are reviewed at length with the patient today.   The patient does not have concerns regarding her medicines.  The following changes were made today: Stop amiodarone  Labs/ tests ordered today include:  Orders Placed This Encounter  Procedures   EKG 12-Lead     Disposition:   FU with Julie Rice 3 months  Signed, Winnie Umali [name], MD  06/19/2021 5:07 PM     [facility] [address] [place] [address] [phone] (office) [phone] (fax)

## 2021-06-25 DIAGNOSIS — J9611 Chronic respiratory failure with hypoxia: Secondary | ICD-10-CM | POA: Diagnosis not present

## 2021-06-25 DIAGNOSIS — J449 Chronic obstructive pulmonary disease, unspecified: Secondary | ICD-10-CM | POA: Diagnosis not present

## 2021-06-26 ENCOUNTER — Telehealth: Payer: Self-pay | Admitting: Cardiology

## 2021-06-26 NOTE — Telephone Encounter (Signed)
Called in by [facility]    Pt c/o swelling: STAT is pt has developed SOB within 24 hours  If swelling, where is the swelling located? Bilateral legs and feet   How much weight have you gained and in what time span? 5lb   Have you gained 3 pounds in a day or 5 pounds in a week? Yes   Do you have a log of your daily weights (if so, list)? 170lb today  Are you currently taking a fluid pill? Yes   Are you currently SOB? No   Have you traveled recently? No

## 2021-06-26 NOTE — Telephone Encounter (Signed)
Spoke with the patient who reports that she is still having some swelling in her feet and ankles. She states that it has improved a bit, but her left foot is still red from an injury. She reports that weight has been fluctuating between 168 and 170. She denies any chest pain. She reports her SOB is at baseline. She is avoiding salt and elevating her legs. She takes lasix 40 mg as needed for swelling. Advised to take lasix daily.

## 2021-06-27 NOTE — Telephone Encounter (Signed)
Pts RN Case manager calling with pt in on the phone as well. She is calling because she continues to have Left lower extremity edema with erythema and pain. She states she has been taking 40mg  lasix  qd and is back down to her baseline weight at 165lbs this morning. Her SOB is at baseline. She is compliant on her eliquis and has not missed a dose. Denies recent fever, palps, and CP.  I advised pt she should contact her PCP's office asap regarding the edema, erythema, and pain in her left lower extremity. Of note, her left ankle was recently injured. I explained to pt and case manager, since her weight is at baseline and she does not have increased SOB, it will be important for her ankle to be assessed for issues not related to her heart. We discussed asking to speak with her PCP's RN or CMA versus requesting an appt with the scheduler directly. Her case will assist her with this.  Will forward to MD for review.

## 2021-06-28 NOTE — Telephone Encounter (Signed)
Returned call to Pt.  Pt was confused after last phone call who she should contact.  Advised Pt she needs to contact her PCP today to have her leg evaluated.  She will call PCP now.

## 2021-06-28 NOTE — Telephone Encounter (Signed)
Patient calling back. She states the left foot is swelling most. She says her feet are also stinging and red. She states the redness goes up her leg. She says they other day she kept seeing a shiny spot on her foot and when she touched it it was a water spot. She says there was no water in the room, but she is not sure if it was coming from her foot.

## 2021-06-29 ENCOUNTER — Other Ambulatory Visit: Payer: Self-pay

## 2021-06-29 ENCOUNTER — Ambulatory Visit (INDEPENDENT_AMBULATORY_CARE_PROVIDER_SITE_OTHER): Payer: Medicare Other | Admitting: Family Medicine

## 2021-06-29 VITALS — BP 150/80 | HR 62 | Temp 98.3°F | Wt 169.0 lb

## 2021-06-29 DIAGNOSIS — R600 Localized edema: Secondary | ICD-10-CM

## 2021-06-29 NOTE — Progress Notes (Signed)
Established Patient Office Visit  Subjective:  Patient ID: Julie Rice, female    DOB: October 07, 1936  Age: 85 y.o. MRN: [id number]  CC:  Chief Complaint  Patient presents with   Edema    Both feet, x 1 month, difficult to walk    HPI Julie Rice presents for what she states is increased swelling in her feet and lower legs for 1 month.  On looking back though apparently she has had edema for much longer than that.  She has been taking gabapentin 300 mg 3 times daily and scaled this back herself to 100 mg 3 times daily but has not seen any improvement in her swelling.  She already takes furosemide 40 mg twice daily.  Her chronic problems include history of atrial fibrillation, history of heart failure, hypertension, history of wide-complex tachycardia, COPD, rheumatoid arthritis.  She recently had ablation procedure for her tachycardia.  She had echocardiogram 6/22 which showed ejection fraction 60 to 65%.  Grade 1 diastolic dysfunction.  Recent TSH normal.  Recent creatinine stable at 0.88.  Current medications include gabapentin, furosemide, Cymbalta, Eliquis, omeprazole, metoprolol.  Denies any recent dietary changes.  No dyspnea.  No chest pains.  Tries to elevate her legs some.  Her weight by her home scale is 165 pounds.  This has been relatively stable.  She did have a recent injury with significant bruising of her left lower extremity and went to urgent care and had x-rays which showed no fracture.  There had been some question of cellulitis changes initially but those have basically resolved.  She is weightbearing without difficulty at this time.  Past Medical History:  Diagnosis Date   A-fib (HCC)    Anemia    Anxiety    Bipolar disorder (HCC)    Blood transfusion 1991   autologous pts own blood given    CAD (coronary artery disease)    Cataract    Chest pain    "@ rest, lying down, w/exertion"   CHF (congestive heart failure) (HCC) 07/2020   Chronic back pain     "mostly lower back but I do have upper back pain regularly" (04/06/2018)   COPD (chronic obstructive pulmonary disease) (HCC)    Depression    Esophageal stricture    Fibromyalgia    GERD (gastroesophageal reflux disease)    Heart murmur    "slight" (04/06/2018)   Hiatal hernia    Hyperlipidemia    Patient denies   Hypertension    Internal hemorrhoids    Lumbago    Mitral regurgitation    Osteoarthritis    Osteoporosis    Pneumonia ~ 01/2018   PONV (postoperative nausea and vomiting)    severe ponv, "in the past" (04/06/2018)   Rectal bleeding    Rheumatoid arthritis (HCC)    Spondylosis    TIA (transient ischemic attack) 2013   Urinary incontinence    wears depends    Past Surgical History:  Procedure Laterality Date   ABDOMINAL HYSTERECTOMY  1982   BACK SURGERY     BALLOON DILATION N/A 09/21/2018   Procedure: BALLOON DILATION;  Surgeon: Hilarie Fredrickson, MD;  Location: [facility] ENDOSCOPY;  Service: Endoscopy;  Laterality: N/A;   BALLOON DILATION N/A 08/12/2019   Procedure: BALLOON DILATION;  Surgeon: Lynann Bologna, MD;  Location: [facility] ENDOSCOPY;  Service: Endoscopy;  Laterality: N/A;   BIOPSY  08/12/2019   Procedure: BIOPSY;  Surgeon: Lynann Bologna, MD;  Location: [facility] ENDOSCOPY;  Service: Endoscopy;;   BLADDER  SUSPENSION  1980's   BOTOX INJECTION N/A 09/21/2018   Procedure: BOTOX INJECTION;  Surgeon: Hilarie Fredrickson, MD;  Location: [facility] ENDOSCOPY;  Service: Endoscopy;  Laterality: N/A;   BOTOX INJECTION N/A 08/12/2019   Procedure: BOTOX INJECTION;  Surgeon: Lynann Bologna, MD;  Location: [facility] ENDOSCOPY;  Service: Endoscopy;  Laterality: N/A;   CATARACT EXTRACTION W/ INTRAOCULAR LENS  IMPLANT, BILATERAL Bilateral 2010   DILATION AND CURETTAGE OF UTERUS  1961   ESOPHAGEAL MANOMETRY N/A 03/25/2018   Procedure: ESOPHAGEAL MANOMETRY (EM);  Surgeon: Napoleon Form, MD;  Location: [facility] ENDOSCOPY;  Service: Endoscopy;  Laterality: N/A;   ESOPHAGOGASTRODUODENOSCOPY (EGD) WITH PROPOFOL N/A 04/07/2018    Procedure: ESOPHAGOGASTRODUODENOSCOPY (EGD) WITH PROPOFOL;  Surgeon: Sherrilyn Rist, MD;  Location: [facility] ENDOSCOPY;  Service: Gastroenterology;  Laterality: N/A;   ESOPHAGOGASTRODUODENOSCOPY (EGD) WITH PROPOFOL N/A 09/21/2018   Procedure: ESOPHAGOGASTRODUODENOSCOPY (EGD) WITH PROPOFOL;  Surgeon: Hilarie Fredrickson, MD;  Location: [facility] ENDOSCOPY;  Service: Endoscopy;  Laterality: N/A;   ESOPHAGOGASTRODUODENOSCOPY (EGD) WITH PROPOFOL N/A 08/12/2019   Procedure: ESOPHAGOGASTRODUODENOSCOPY (EGD) WITH PROPOFOL;  Surgeon: Lynann Bologna, MD;  Location: [facility] ENDOSCOPY;  Service: Endoscopy;  Laterality: N/A;   HERNIA REPAIR     HIATAL HERNIA REPAIR N/A 08/02/2016   Procedure: LAPAROSCOPIC REPAIR OF LARGE  HIATAL HERNIA;  Surgeon: Glenna Fellows, MD;  Location: [facility] ORS;  Service: General;  Laterality: N/A;   JOINT REPLACEMENT     LAPAROSCOPIC NISSEN FUNDOPLICATION N/A 08/02/2016   Procedure: LAPAROSCOPIC NISSEN FUNDOPLICATION;  Surgeon: Glenna Fellows, MD;  Location: [facility] ORS;  Service: General;  Laterality: N/A;   LUMBAR LAMINECTOMY  1990; 1994; [id number]   "I've got 2 stainless steel rods; 6 screws; 2 ray cages"took bone from right hip to put in back   RIGHT/LEFT HEART CATH AND CORONARY ANGIOGRAPHY N/A 07/26/2019   Procedure: RIGHT/LEFT HEART CATH AND CORONARY ANGIOGRAPHY;  Surgeon: Lyn Records, MD;  Location: [facility] INVASIVE CV LAB;  Service: Cardiovascular;  Laterality: N/A;   SVT ABLATION N/A 04/25/2021   Procedure: SVT ABLATION;  Surgeon: Regan Lemming, MD;  Location: [facility] INVASIVE CV LAB;  Service: Cardiovascular;  Laterality: N/A;   TEE WITHOUT CARDIOVERSION N/A 08/19/2019   Procedure: TRANSESOPHAGEAL ECHOCARDIOGRAM (TEE);  Surgeon: Lewayne Bunting, MD;  Location: [facility] ENDOSCOPY;  Service: Cardiovascular;  Laterality: N/A;   TONSILLECTOMY AND ADENOIDECTOMY  1945   TOTAL KNEE ARTHROPLASTY Right ~ 1996   TUBAL LIGATION  ~ 1976    Family History  Problem Relation Age of Onset   Kidney disease Mother     Hypertension Mother    Heart disease Father 43       die of MI at age 55   Suicidality Son    Heart disease Brother    Heart attack Brother    Colon cancer Neg Hx    Esophageal cancer Neg Hx    Pancreatic cancer Neg Hx    Rectal cancer Neg Hx    Stomach cancer Neg Hx     Social History   Socioeconomic History   Marital status: Widowed    Spouse name: Not on file   Number of children: 2   Years of education: Not on file   Highest education level: Not on file  Occupational History   Occupation: retired    Associate Professor: RETIRED  Tobacco Use   Smoking status: Never   Smokeless tobacco: Never  Vaping Use   Vaping Use: Never used  Substance and Sexual Activity   Alcohol use: Never  Drug use: Never   Sexual activity: Not Currently    Comment: 1st intercourse 72 yo-1 partner  Other Topics Concern   Not on file  Social History Narrative   Retired    Widowed   Social Determinants of Corporate investment banker Strain: Low Risk    Difficulty of Paying Living Expenses: Not hard at all  Food Insecurity: No Food Insecurity   Worried About Programme researcher, broadcasting/film/video in the Last Year: Never true   Barista in the Last Year: Never true  Transportation Needs: No Transportation Needs   Lack of Transportation (Medical): No   Lack of Transportation (Non-Medical): No  Physical Activity: Inactive   Days of Exercise per Week: 0 days   Minutes of Exercise per Session: 0 min  Stress: Stress Concern Present   Feeling of Stress : To some extent  Social Connections: Socially Isolated   Frequency of Communication with Friends and Family: Twice a week   Frequency of Social Gatherings with Friends and Family: Never   Attends Religious Services: 1 to 4 times per year   Active Member of [employer] or Organizations: No   Attends Banker Meetings: Never   Marital Status: Widowed  Catering manager Violence: Not At Risk   Fear of Current or Ex-Partner: No   Emotionally Abused: No    Physically Abused: No   Sexually Abused: No    Outpatient Medications Prior to Visit  Medication Sig Dispense Refill   acetaminophen (TYLENOL) 500 MG tablet Take 1,000 mg by mouth every 6 (six) hours as needed for moderate pain, headache or fever.     albuterol (VENTOLIN HFA) 108 (90 Base) MCG/ACT inhaler TAKE 2 PUFFS BY MOUTH EVERY 6 HOURS AS NEEDED FOR WHEEZE OR SHORTNESS OF BREATH 8 g 2   Carboxymethylcellulose Sodium (REFRESH TEARS OP) Place 1 drop into both eyes 2 (two) times daily.     cetirizine (ZYRTEC) 10 MG tablet Take 10 mg by mouth daily as needed for allergies.     clonazePAM (KLONOPIN) 0.5 MG tablet TAKE 1 TABLET BY MOUTH TWICE A DAY AS NEEDED FOR ANXIETY 60 tablet 2   DULoxetine (CYMBALTA) 60 MG capsule TAKE 1 CAPSULE BY MOUTH EVERY DAY 90 capsule 3   ELIQUIS 5 MG TABS tablet TAKE 1 TABLET BY MOUTH TWICE A DAY 180 tablet 1   furosemide (LASIX) 40 MG tablet TAKE 1 TABLET (40 MG TOTAL) BY MOUTH DAILY AS NEEDED FOR FLUID OR EDEMA. 90 tablet 3   gabapentin (NEURONTIN) 300 MG capsule Take 1 capsule (300 mg total) by mouth 3 (three) times daily. 270 capsule 1   metoprolol tartrate (LOPRESSOR) 50 MG tablet Take 1 tablet (50 mg total) by mouth 2 (two) times daily. 60 tablet 11   Multiple Vitamins-Minerals (ONE-A-DAY WOMENS 50+) TABS Take 1 tablet by mouth daily.     omeprazole (PRILOSEC) 20 MG capsule TAKE 1 CAPSULE BY MOUTH EVERY DAY 90 capsule 3   sodium chloride (OCEAN) 0.65 % SOLN nasal spray Place 1 spray into both nostrils as needed for congestion.     No facility-administered medications prior to visit.    Allergies  Allergen Reactions   Tape Other (See Comments)    Band aides, adhesive tape Redness and pulls skin off   Tramadol Other (See Comments)    Pt has prolonged Qtc interval of 540- cannot give tramadol per pharmacy   Morphine Itching and Rash    Flushing    ROS Review  of Systems  Constitutional:  Negative for chills, fatigue, fever and unexpected weight  change.  Eyes:  Negative for visual disturbance.  Respiratory:  Negative for cough, chest tightness, shortness of breath and wheezing.   Cardiovascular:  Positive for leg swelling. Negative for chest pain and palpitations.  Neurological:  Negative for dizziness, seizures, syncope, weakness, light-headedness and headaches.     Objective:    Physical Exam Constitutional:      Appearance: She is well-developed.  Eyes:     Pupils: Pupils are equal, round, and reactive to light.  Neck:     Thyroid: No thyromegaly.     Vascular: No JVD.  Cardiovascular:     Rate and Rhythm: Normal rate and regular rhythm.     Heart sounds:    No gallop.  Pulmonary:     Effort: Pulmonary effort is normal. No respiratory distress.     Breath sounds: Normal breath sounds. No wheezing or rales.  Musculoskeletal:     Cervical back: Neck supple.     Right lower leg: Edema present.     Left lower leg: Edema present.     Comments: She has some pitting edema feet ankles lower legs bilaterally  Neurological:     Mental Status: She is alert.    BP (!) 150/80 (BP Location: Left Arm, Patient Position: Sitting, Cuff Size: Normal)   Pulse 62   Temp 98.3 F (36.8 C) (Oral)   Wt 169 lb (76.7 kg)   SpO2 97%   BMI 31.42 kg/m  Wt Readings from Last 3 Encounters:  06/29/21 169 lb (76.7 kg)  06/19/21 170 lb (77.1 kg)  05/29/21 168 lb (76.2 kg)     Health Maintenance Due  Topic Date Due   COVID-19 Vaccine (1) Never done   Zoster Vaccines- Shingrix (1 of 2) Never done   TETANUS/TDAP  04/08/2016   INFLUENZA VACCINE  06/04/2021    There are no preventive care reminders to display for this patient.  Lab Results  Component Value Date   TSH 1.588 04/04/2021   Lab Results  Component Value Date   WBC 9.4 04/05/2021   HGB 10.8 (L) 04/05/2021   HCT 34.1 (L) 04/05/2021   MCV 99.4 04/05/2021   PLT 234 04/05/2021   Lab Results  Component Value Date   NA 135 04/05/2021   K 4.7 04/05/2021   CO2 25  04/05/2021   GLUCOSE 93 04/05/2021   BUN 14 04/05/2021   CREATININE 0.88 04/05/2021   BILITOT 0.4 03/09/2020   ALKPHOS 67 03/09/2020   AST 19 03/09/2020   ALT 14 03/09/2020   PROT 6.6 03/09/2020   ALBUMIN 4.1 03/09/2020   CALCIUM 8.7 (L) 04/05/2021   ANIONGAP 7 04/05/2021   GFR 42.83 (L) 02/12/2021   Lab Results  Component Value Date   CHOL 172 03/09/2020   Lab Results  Component Value Date   HDL 65.60 03/09/2020   Lab Results  Component Value Date   LDLCALC 84 03/09/2020   Lab Results  Component Value Date   TRIG 111.0 03/09/2020   Lab Results  Component Value Date   CHOLHDL 3 03/09/2020   No results found for: HGBA1C    Assessment & Plan:   #1 bilateral foot and leg edema. Appears to be chronic but recent exacerbation. Likely multifactorial.  She does take gabapentin but has not seen improvement since reducing dose recently.  Recent echo as above.  Normal EF at that time.  -Elevate legs frequently -We will bump  her furosemide up to 2 tablets in the morning and 1 tablet in the afternoon for 3 days then drop back to her usual dose of 1 twice daily -Recommend follow-up with her primary in 1 week to reassess    Follow-up: Return in about 1 week (around 07/06/2021).    Evelena Peat, MD

## 2021-06-29 NOTE — Patient Instructions (Signed)
Elevate legs frequently  Increase the Furosemide to two tablets in the morning and one in afternoon for 3 days and then drop back to one twice daily  Set up follow up with [facility] in 1-2 weeks.

## 2021-07-01 ENCOUNTER — Other Ambulatory Visit: Payer: Self-pay | Admitting: Cardiovascular Disease

## 2021-07-01 ENCOUNTER — Other Ambulatory Visit: Payer: Self-pay | Admitting: Adult Health

## 2021-07-01 DIAGNOSIS — Z760 Encounter for issue of repeat prescription: Secondary | ICD-10-CM

## 2021-07-02 NOTE — Telephone Encounter (Signed)
Ok for refill? 

## 2021-07-06 ENCOUNTER — Ambulatory Visit (INDEPENDENT_AMBULATORY_CARE_PROVIDER_SITE_OTHER): Payer: Medicare Other | Admitting: Adult Health

## 2021-07-06 ENCOUNTER — Encounter: Payer: Self-pay | Admitting: Adult Health

## 2021-07-06 ENCOUNTER — Other Ambulatory Visit: Payer: Self-pay

## 2021-07-06 VITALS — BP 160/66 | HR 69 | Temp 98.5°F | Ht 61.5 in | Wt 165.8 lb

## 2021-07-06 DIAGNOSIS — R600 Localized edema: Secondary | ICD-10-CM

## 2021-07-06 DIAGNOSIS — R2681 Unsteadiness on feet: Secondary | ICD-10-CM

## 2021-07-06 NOTE — Progress Notes (Signed)
Subjective:    Patient ID: Julie Rice, female    DOB: 1936/05/12, 85 y.o.   MRN: [id number]  HPI 85 year old female who  has a past medical history of A-fib (HCC), Anemia, Anxiety, Bipolar disorder (HCC), Blood transfusion (1991), CAD (coronary artery disease), Cataract, Chest pain, CHF (congestive heart failure) (HCC) (07/2020), Chronic back pain, COPD (chronic obstructive pulmonary disease) (HCC), Depression, Esophageal stricture, Fibromyalgia, GERD (gastroesophageal reflux disease), Heart murmur, Hiatal hernia, Hyperlipidemia, Hypertension, Internal hemorrhoids, Lumbago, Mitral regurgitation, Osteoarthritis, Osteoporosis, Pneumonia (~ 01/2018), PONV (postoperative nausea and vomiting), Rectal bleeding, Rheumatoid arthritis (HCC), Spondylosis, TIA (transient ischemic attack) (2013), and Urinary incontinence.  She was seen 1 week ago by another provider in the office after noticing increased swelling in her feet and lower leg x1 month.  He does have a long history of lower extremity edema and was already on furosemide 40 mg twice daily.  She has been taking gabapentin 300 mg 3 times daily but scaled this back on her own to 100 mg 3 times daily, unfortunately she did not see any results.  Her last echo in June 2022 showed EF of 60 to 65%.  Grade 1 diastolic dysfunction.  Her TSH was normal and creatinine stable at 0.88.  It was thought that her chronic lower extremity edema with recent exacerbation was likely multifactorial.  She was advised to elevate legs frequently.  Furosemide was increased to 80 mg in the morning and 40 mg in the afternoon for 3 days and then drop back to her usual dose of 40 mg twice daily.  She was advised to follow-up with her PCP in 1 week to reassess  Today she reports that she did not notice any improvement in her lower extremity swelling when she was increasing her Lasix.  She reports that she continues to have bilateral lower extremity edema but the only thing that  she has noticed that has helped has been elevating her legs when resting.  She also reports that she has been having ongoing discoloration in bilateral feet up into the legs.  She reports that her feet will become purple into a dark purple almost black at times.  This mostly happens when sitting.  Denies numbness or tingling.  Does take her Eliquis.  She also reports that she has had multiple falls over the last couple months.  Falls have been mechanical in nature.  She denies serious injury.   Review of Systems See HPI   Past Medical History:  Diagnosis Date   A-fib (HCC)    Anemia    Anxiety    Bipolar disorder (HCC)    Blood transfusion 1991   autologous pts own blood given    CAD (coronary artery disease)    Cataract    Chest pain    "@ rest, lying down, w/exertion"   CHF (congestive heart failure) (HCC) 07/2020   Chronic back pain    "mostly lower back but I do have upper back pain regularly" (04/06/2018)   COPD (chronic obstructive pulmonary disease) (HCC)    Depression    Esophageal stricture    Fibromyalgia    GERD (gastroesophageal reflux disease)    Heart murmur    "slight" (04/06/2018)   Hiatal hernia    Hyperlipidemia    Patient denies   Hypertension    Internal hemorrhoids    Lumbago    Mitral regurgitation    Osteoarthritis    Osteoporosis    Pneumonia ~ 01/2018   PONV (postoperative  nausea and vomiting)    severe ponv, "in the past" (04/06/2018)   Rectal bleeding    Rheumatoid arthritis (HCC)    Spondylosis    TIA (transient ischemic attack) 2013   Urinary incontinence    wears depends    Social History   Socioeconomic History   Marital status: Widowed    Spouse name: Not on file   Number of children: 2   Years of education: Not on file   Highest education level: Not on file  Occupational History   Occupation: retired    Associate Professor: RETIRED  Tobacco Use   Smoking status: Never   Smokeless tobacco: Never  Vaping Use   Vaping Use: Never used   Substance and Sexual Activity   Alcohol use: Never   Drug use: Never   Sexual activity: Not Currently    Comment: 1st intercourse 22 yo-1 partner  Other Topics Concern   Not on file  Social History Narrative   Retired    Widowed   Social Determinants of Corporate investment banker Strain: Low Risk    Difficulty of Paying Living Expenses: Not hard at all  Food Insecurity: No Food Insecurity   Worried About Programme researcher, broadcasting/film/video in the Last Year: Never true   Barista in the Last Year: Never true  Transportation Needs: No Transportation Needs   Lack of Transportation (Medical): No   Lack of Transportation (Non-Medical): No  Physical Activity: Inactive   Days of Exercise per Week: 0 days   Minutes of Exercise per Session: 0 min  Stress: Stress Concern Present   Feeling of Stress : To some extent  Social Connections: Socially Isolated   Frequency of Communication with Friends and Family: Twice a week   Frequency of Social Gatherings with Friends and Family: Never   Attends Religious Services: 1 to 4 times per year   Active Member of [employer] or Organizations: No   Attends Banker Meetings: Never   Marital Status: Widowed  Catering manager Violence: Not At Risk   Fear of Current or Ex-Partner: No   Emotionally Abused: No   Physically Abused: No   Sexually Abused: No    Past Surgical History:  Procedure Laterality Date   ABDOMINAL HYSTERECTOMY  1982   BACK SURGERY     BALLOON DILATION N/A 09/21/2018   Procedure: BALLOON DILATION;  Surgeon: Hilarie Fredrickson, MD;  Location: [facility] ENDOSCOPY;  Service: Endoscopy;  Laterality: N/A;   BALLOON DILATION N/A 08/12/2019   Procedure: BALLOON DILATION;  Surgeon: Lynann Bologna, MD;  Location: [facility] ENDOSCOPY;  Service: Endoscopy;  Laterality: N/A;   BIOPSY  08/12/2019   Procedure: BIOPSY;  Surgeon: Lynann Bologna, MD;  Location: [facility] ENDOSCOPY;  Service: Endoscopy;;   BLADDER SUSPENSION  1980's   BOTOX INJECTION N/A 09/21/2018    Procedure: BOTOX INJECTION;  Surgeon: Hilarie Fredrickson, MD;  Location: [facility] ENDOSCOPY;  Service: Endoscopy;  Laterality: N/A;   BOTOX INJECTION N/A 08/12/2019   Procedure: BOTOX INJECTION;  Surgeon: Lynann Bologna, MD;  Location: [facility] ENDOSCOPY;  Service: Endoscopy;  Laterality: N/A;   CATARACT EXTRACTION W/ INTRAOCULAR LENS  IMPLANT, BILATERAL Bilateral 2010   DILATION AND CURETTAGE OF UTERUS  1961   ESOPHAGEAL MANOMETRY N/A 03/25/2018   Procedure: ESOPHAGEAL MANOMETRY (EM);  Surgeon: Napoleon Form, MD;  Location: [facility] ENDOSCOPY;  Service: Endoscopy;  Laterality: N/A;   ESOPHAGOGASTRODUODENOSCOPY (EGD) WITH PROPOFOL N/A 04/07/2018   Procedure: ESOPHAGOGASTRODUODENOSCOPY (EGD) WITH PROPOFOL;  Surgeon: Amada Jupiter  L III, MD;  Location: [facility] ENDOSCOPY;  Service: Gastroenterology;  Laterality: N/A;   ESOPHAGOGASTRODUODENOSCOPY (EGD) WITH PROPOFOL N/A 09/21/2018   Procedure: ESOPHAGOGASTRODUODENOSCOPY (EGD) WITH PROPOFOL;  Surgeon: Hilarie Fredrickson, MD;  Location: [facility] ENDOS[facility];  Service: Endoscopy;  Laterality: N/A;   ESOPHAGOGASTRODUODENOSCOPY (EGD) WITH PROPOFOL N/A 08/12/2019   Procedure: ESOPHAGOGASTRODUODENOSCOPY (EGD) WITH PROPOFOL;  Surgeon: Lynann Bologna, MD;  Location: [facility] ENDOSCOPY;  Service: Endoscopy;  Laterality: N/A;   HERNIA REPAIR     HIATAL HERNIA REPAIR N/A 08/02/2016   Procedure: LAPAROSCOPIC REPAIR OF LARGE  HIATAL HERNIA;  Surgeon: Glenna Fellows, MD;  Location: [facility] ORS;  Service: General;  Laterality: N/A;   JOINT REPLACEMENT     LAPAROSCOPIC NISSEN FUNDOPLICATION N/A 08/02/2016   Procedure: LAPAROSCOPIC NISSEN FUNDOPLICATION;  Surgeon: Glenna Fellows, MD;  Location: [facility] ORS;  Service: General;  Laterality: N/A;   LUMBAR LAMINECTOMY  1990; 1994; [id number]   "I've got 2 stainless steel rods; 6 screws; 2 ray cages"took bone from right hip to put in back   RIGHT/LEFT HEART CATH AND CORONARY ANGIOGRAPHY N/A 07/26/2019   Procedure: RIGHT/LEFT HEART CATH AND CORONARY ANGIOGRAPHY;  Surgeon:  Lyn Records, MD;  Location: [facility] INVASIVE CV [facility];  Service: Cardiovascular;  Laterality: N/A;   SVT ABLATION N/A 04/25/2021   Procedure: SVT ABLATION;  Surgeon: Regan Lemming, MD;  Location: [facility] CV [facility];  Service: Cardiovascular;  Laterality: N/A;   TEE WITHOUT CARDIOVERSION N/A 08/19/2019   Procedure: TRANSESOPHAGEAL ECHOCARDIOGRAM (TEE);  Surgeon: Lewayne Bunting, MD;  Location: [facility] ENDOSCOPY;  Service: Cardiovascular;  Laterality: N/A;   TONSILLECTOMY AND ADENOIDECTOMY  1945   TOTAL KNEE ARTHROPLASTY Right ~ 1996   TUBAL LIGATION  ~ 1976    Family History  Problem Relation Age of Onset   Kidney disease Mother    Hypertension Mother    Heart disease Father 85       die of MI at age 53   Suicidality Son    Heart disease Brother    Heart attack Brother    Colon cancer Neg Hx    Esophageal cancer Neg Hx    Pancreatic cancer Neg Hx    Rectal cancer Neg Hx    Stomach cancer Neg Hx     Allergies  Allergen Reactions   Tape Other (See Comments)    Band aides, adhesive tape Redness and pulls skin off   Tramadol Other (See Comments)    Pt has prolonged Qtc interval of 540- cannot give tramadol per pharmacy   Morphine Itching and Rash    Flushing    Current Outpatient Medications on File Prior to Visit  Medication Sig Dispense Refill   acetaminophen (TYLENOL) 500 MG tablet Take 1,000 mg by mouth every 6 (six) hours as needed for moderate pain, headache or fever.     albuterol (VENTOLIN HFA) 108 (90 Base) MCG/ACT inhaler TAKE 2 PUFFS BY MOUTH EVERY 6 HOURS AS NEEDED FOR WHEEZE OR SHORTNESS OF BREATH 8 g 2   Carboxymethylcellulose Sodium (REFRESH TEARS OP) Place 1 drop into both eyes 2 (two) times daily.     cetirizine (ZYRTEC) 10 MG tablet Take 10 mg by mouth daily as needed for allergies.     clonazePAM (KLONOPIN) 0.5 MG tablet TAKE 1 TABLET BY MOUTH TWICE A DAY AS NEEDED FOR ANXIETY 60 tablet 2   DULoxetine (CYMBALTA) 60 MG capsule TAKE 1 CAPSULE BY MOUTH EVERY DAY  90 capsule 3   ELIQUIS 5 MG TABS tablet TAKE 1 TABLET  BY MOUTH TWICE A DAY 180 tablet 1   furosemide (LASIX) 40 MG tablet TAKE 1 TABLET (40 MG TOTAL) BY MOUTH DAILY AS NEEDED FOR FLUID OR EDEMA. 90 tablet 3   metoprolol tartrate (LOPRESSOR) 50 MG tablet Take 1 tablet (50 mg total) by mouth 2 (two) times daily. 60 tablet 11   Multiple Vitamins-Minerals (ONE-A-DAY WOMENS 50+) TABS Take 1 tablet by mouth daily.     omeprazole (PRILOSEC) 20 MG capsule TAKE 1 CAPSULE BY MOUTH EVERY DAY 90 capsule 3   sodium chloride (OCEAN) 0.65 % SOLN nasal spray Place 1 spray into both nostrils as needed for congestion.     gabapentin (NEURONTIN) 300 MG capsule Take 1 capsule (300 mg total) by mouth 3 (three) times daily. 270 capsule 1   No current facility-administered medications on file prior to visit.    BP (!) 160/66 (BP Location: Left Arm, Patient Position: Sitting, Cuff Size: Normal)   Pulse 69   Temp 98.5 F (36.9 C) (Oral)   Ht 5' 1.5" (1.562 m)   Wt 165 lb 12.8 oz (75.2 kg)   SpO2 97%   BMI 30.82 kg/m       Objective:   Physical Exam Vitals and nursing note reviewed.  Constitutional:      Appearance: Normal appearance.  Cardiovascular:     Rate and Rhythm: Normal rate and regular rhythm.     Pulses: Normal pulses.     Heart sounds: Normal heart sounds.  Pulmonary:     Effort: Pulmonary effort is normal.     Breath sounds: Normal breath sounds.  Musculoskeletal:        General: Normal range of motion.     Right lower leg: 2+ Pitting Edema present.     Left lower leg: 2+ Pitting Edema present.  Skin:    General: Skin is warm and dry.     Comments: Does have purplish discoloration to bilateral feet, worse in the left.  Due to swelling is hard to palpate pedal pulses.  Neurological:     General: No focal deficit present.     Mental Status: She is alert and oriented to person, place, and time.  Psychiatric:        Mood and Affect: Mood normal.        Behavior: Behavior normal.         Thought Content: Thought content normal.        Judgment: Judgment normal.      Assessment & Plan:  1. Bilateral leg edema -We will refer to vascular surgery for concern of peripheral vascular disease.  Continue to elevate legs.  Encouraged the use of compression socks. - Ambulatory referral to Vascular Surgery  2. Gait instability  - Ambulatory referral to Physical Therapy  Shirline Frees, NP

## 2021-07-06 NOTE — Patient Instructions (Signed)
It was great seeing you today   I am going to refer you to a vascular specialist and to physical therapy   Please let me know if you need anything

## 2021-07-26 DIAGNOSIS — J9611 Chronic respiratory failure with hypoxia: Secondary | ICD-10-CM | POA: Diagnosis not present

## 2021-07-26 DIAGNOSIS — J449 Chronic obstructive pulmonary disease, unspecified: Secondary | ICD-10-CM | POA: Diagnosis not present

## 2021-07-30 ENCOUNTER — Other Ambulatory Visit: Payer: Self-pay

## 2021-07-30 DIAGNOSIS — I8393 Asymptomatic varicose veins of bilateral lower extremities: Secondary | ICD-10-CM

## 2021-08-07 ENCOUNTER — Encounter: Payer: Medicare Other | Admitting: Vascular Surgery

## 2021-08-07 ENCOUNTER — Encounter (HOSPITAL_COMMUNITY): Payer: Medicare Other

## 2021-08-25 DIAGNOSIS — J9611 Chronic respiratory failure with hypoxia: Secondary | ICD-10-CM | POA: Diagnosis not present

## 2021-08-25 DIAGNOSIS — J449 Chronic obstructive pulmonary disease, unspecified: Secondary | ICD-10-CM | POA: Diagnosis not present

## 2021-08-30 ENCOUNTER — Ambulatory Visit: Payer: Medicare Other | Admitting: Physician Assistant

## 2021-08-30 ENCOUNTER — Other Ambulatory Visit: Payer: Self-pay

## 2021-08-30 ENCOUNTER — Ambulatory Visit (HOSPITAL_COMMUNITY)
Admission: RE | Admit: 2021-08-30 | Discharge: 2021-08-30 | Disposition: A | Discharge status: Home / Self Care | Payer: Medicare Other | Source: Ambulatory Visit | Attending: Vascular Surgery | Admitting: Vascular Surgery

## 2021-08-30 VITALS — BP 170/89 | HR 79 | Temp 98.2°F | Resp 18 | Ht 61.0 in | Wt 160.6 lb

## 2021-08-30 DIAGNOSIS — I872 Venous insufficiency (chronic) (peripheral): Secondary | ICD-10-CM | POA: Diagnosis not present

## 2021-08-30 DIAGNOSIS — I8393 Asymptomatic varicose veins of bilateral lower extremities: Secondary | ICD-10-CM | POA: Diagnosis not present

## 2021-08-30 DIAGNOSIS — M7989 Other specified soft tissue disorders: Secondary | ICD-10-CM

## 2021-08-30 NOTE — Progress Notes (Signed)
Requested by:  Shirline Frees, NP [address] [place],  [place] [address]  Reason for consultation: BLE edema and discoloration     History of Present Illness   Julie Rice is a 85 y.o. (1936-10-05) female who presents with her daughter for evaluation of lower extremity edema and discoloration. She says she has had swelling for many years but about 1 year ago is when she started having increased swelling, discoloration and pain in left > right leg. She does report several falls that occurred around this time and seems that her leg issues worsened following her falls. She explains that at times her left leg especially gets extremely swollen and has had some weeping. When it is really swollen it becomes "purple". She has had redness and warmth and burning in her legs as well. She says she elevates in a recliner and has a wedge, but she does not use it regularly. She has some compression stockings but says she has not used them because she tried once to get them on and they were so tight that she couldn't. She usually ambulates with a cane. She does not do a lot of walking because of arthritis in her knees and she also states she has balance issues. She reports no history of DVT. No family history of venous disease.   Venous symptoms include: aching, heavy, burning, swelling Onset/duration:  > 1 year  Occupation:  retired Aggravating factors: sitting, standing Alleviating factors: elevation Compression:  no Helps:  n/a Pain medications:  none Previous vein procedures:  none History of DVT:  No  Past Medical History:  Diagnosis Date   A-fib (HCC)    Anemia    Anxiety    Bipolar disorder (HCC)    Blood transfusion 1991   autologous pts own blood given    CAD (coronary artery disease)    Cataract    Chest pain    "@ rest, lying down, w/exertion"   CHF (congestive heart failure) (HCC) 07/2020   Chronic back pain    "mostly lower back but I do have upper back pain  regularly" (04/06/2018)   COPD (chronic obstructive pulmonary disease) (HCC)    Depression    Esophageal stricture    Fibromyalgia    GERD (gastroesophageal reflux disease)    Heart murmur    "slight" (04/06/2018)   Hiatal hernia    Hyperlipidemia    Patient denies   Hypertension    Internal hemorrhoids    Lumbago    Mitral regurgitation    Osteoarthritis    Osteoporosis    Pneumonia ~ 01/2018   PONV (postoperative nausea and vomiting)    severe ponv, "in the past" (04/06/2018)   Rectal bleeding    Rheumatoid arthritis (HCC)    Spondylosis    TIA (transient ischemic attack) 2013   Urinary incontinence    wears depends    Past Surgical History:  Procedure Laterality Date   ABDOMINAL HYSTERECTOMY  1982   BACK SURGERY     BALLOON DILATION N/A 09/21/2018   Procedure: BALLOON DILATION;  Surgeon: Hilarie Fredrickson, MD;  Location: [facility] ENDOSCOPY;  Service: Endoscopy;  Laterality: N/A;   BALLOON DILATION N/A 08/12/2019   Procedure: BALLOON DILATION;  Surgeon: Lynann Bologna, MD;  Location: [facility] ENDOSCOPY;  Service: Endoscopy;  Laterality: N/A;   BIOPSY  08/12/2019   Procedure: BIOPSY;  Surgeon: Lynann Bologna, MD;  Location: [facility] ENDOSCOPY;  Service: Endoscopy;;   BLADDER SUSPENSION  1980's   BOTOX INJECTION  N/A 09/21/2018   Procedure: BOTOX INJECTION;  Surgeon: Hilarie Fredrickson, MD;  Location: [facility] ENDOSCOPY;  Service: Endoscopy;  Laterality: N/A;   BOTOX INJECTION N/A 08/12/2019   Procedure: BOTOX INJECTION;  Surgeon: Lynann Bologna, MD;  Location: [facility] ENDOSCOPY;  Service: Endoscopy;  Laterality: N/A;   CATARACT EXTRACTION W/ INTRAOCULAR LENS  IMPLANT, BILATERAL Bilateral 2010   DILATION AND CURETTAGE OF UTERUS  1961   ESOPHAGEAL MANOMETRY N/A 03/25/2018   Procedure: ESOPHAGEAL MANOMETRY (EM);  Surgeon: Napoleon Form, MD;  Location: [facility] ENDOSCOPY;  Service: Endoscopy;  Laterality: N/A;   ESOPHAGOGASTRODUODENOSCOPY (EGD) WITH PROPOFOL N/A 04/07/2018   Procedure: ESOPHAGOGASTRODUODENOSCOPY (EGD) WITH  PROPOFOL;  Surgeon: Sherrilyn Rist, MD;  Location: [facility] ENDOSCOPY;  Service: Gastroenterology;  Laterality: N/A;   ESOPHAGOGASTRODUODENOSCOPY (EGD) WITH PROPOFOL N/A 09/21/2018   Procedure: ESOPHAGOGASTRODUODENOSCOPY (EGD) WITH PROPOFOL;  Surgeon: Hilarie Fredrickson, MD;  Location: [facility] ENDOSCOPY;  Service: Endoscopy;  Laterality: N/A;   ESOPHAGOGASTRODUODENOSCOPY (EGD) WITH PROPOFOL N/A 08/12/2019   Procedure: ESOPHAGOGASTRODUODENOSCOPY (EGD) WITH PROPOFOL;  Surgeon: Lynann Bologna, MD;  Location: [facility] ENDOSCOPY;  Service: Endoscopy;  Laterality: N/A;   HERNIA REPAIR     HIATAL HERNIA REPAIR N/A 08/02/2016   Procedure: LAPAROSCOPIC REPAIR OF LARGE  HIATAL HERNIA;  Surgeon: Glenna Fellows, MD;  Location: [facility] ORS;  Service: General;  Laterality: N/A;   JOINT REPLACEMENT     LAPAROSCOPIC NISSEN FUNDOPLICATION N/A 08/02/2016   Procedure: LAPAROSCOPIC NISSEN FUNDOPLICATION;  Surgeon: Glenna Fellows, MD;  Location: [facility] ORS;  Service: General;  Laterality: N/A;   LUMBAR LAMINECTOMY  1990; 1994; [id number]   "I've got 2 stainless steel rods; 6 screws; 2 ray cages"took bone from right hip to put in back   RIGHT/LEFT HEART CATH AND CORONARY ANGIOGRAPHY N/A 07/26/2019   Procedure: RIGHT/LEFT HEART CATH AND CORONARY ANGIOGRAPHY;  Surgeon: Lyn Records, MD;  Location: [facility] INVASIVE CV LAB;  Service: Cardiovascular;  Laterality: N/A;   SVT ABLATION N/A 04/25/2021   Procedure: SVT ABLATION;  Surgeon: Regan Lemming, MD;  Location: [facility] INVASIVE CV LAB;  Service: Cardiovascular;  Laterality: N/A;   TEE WITHOUT CARDIOVERSION N/A 08/19/2019   Procedure: TRANSESOPHAGEAL ECHOCARDIOGRAM (TEE);  Surgeon: Lewayne Bunting, MD;  Location: [facility] ENDOSCOPY;  Service: Cardiovascular;  Laterality: N/A;   TONSILLECTOMY AND ADENOIDECTOMY  1945   TOTAL KNEE ARTHROPLASTY Right ~ 1996   TUBAL LIGATION  ~ 1976    Social History   Socioeconomic History   Marital status: Widowed    Spouse name: Not on file   Number of children: 2    Years of education: Not on file   Highest education level: Not on file  Occupational History   Occupation: retired    Associate Professor: RETIRED  Tobacco Use   Smoking status: Never   Smokeless tobacco: Never  Vaping Use   Vaping Use: Never used  Substance and Sexual Activity   Alcohol use: Never   Drug use: Never   Sexual activity: Not Currently    Comment: 1st intercourse 35 yo-1 partner  Other Topics Concern   Not on file  Social History Narrative   Retired    Widowed   Social Determinants of Corporate investment banker Strain: Low Risk    Difficulty of Paying Living Expenses: Not hard at all  Food Insecurity: No Food Insecurity   Worried About Programme researcher, broadcasting/film/video in the Last Year: Never true   Barista in the Last Year: Never true  Transportation Needs: No  Transportation Needs   Lack of Transportation (Medical): No   Lack of Transportation (Non-Medical): No  Physical Activity: Inactive   Days of Exercise per Week: 0 days   Minutes of Exercise per Session: 0 min  Stress: Stress Concern Present   Feeling of Stress : To some extent  Social Connections: Socially Isolated   Frequency of Communication with Friends and Family: Twice a week   Frequency of Social Gatherings with Friends and Family: Never   Attends Religious Services: 1 to 4 times per year   Active Member of [employer] or Organizations: No   Attends Banker Meetings: Never   Marital Status: Widowed  Catering manager Violence: Not At Risk   Fear of Current or Ex-Partner: No   Emotionally Abused: No   Physically Abused: No   Sexually Abused: No    Family History  Problem Relation Age of Onset   Kidney disease Mother    Hypertension Mother    Heart disease Father 48       die of MI at age 74   Suicidality Son    Heart disease Brother    Heart attack Brother    Colon cancer Neg Hx    Esophageal cancer Neg Hx    Pancreatic cancer Neg Hx    Rectal cancer Neg Hx    Stomach cancer Neg Hx      Current Outpatient Medications  Medication Sig Dispense Refill   acetaminophen (TYLENOL) 500 MG tablet Take 1,000 mg by mouth every 6 (six) hours as needed for moderate pain, headache or fever.     albuterol (VENTOLIN HFA) 108 (90 Base) MCG/ACT inhaler TAKE 2 PUFFS BY MOUTH EVERY 6 HOURS AS NEEDED FOR WHEEZE OR SHORTNESS OF BREATH 8 g 2   Carboxymethylcellulose Sodium (REFRESH TEARS OP) Place 1 drop into both eyes 2 (two) times daily.     cetirizine (ZYRTEC) 10 MG tablet Take 10 mg by mouth daily as needed for allergies.     clonazePAM (KLONOPIN) 0.5 MG tablet TAKE 1 TABLET BY MOUTH TWICE A DAY AS NEEDED FOR ANXIETY 60 tablet 2   DULoxetine (CYMBALTA) 60 MG capsule TAKE 1 CAPSULE BY MOUTH EVERY DAY 90 capsule 3   ELIQUIS 5 MG TABS tablet TAKE 1 TABLET BY MOUTH TWICE A DAY 180 tablet 1   furosemide (LASIX) 40 MG tablet TAKE 1 TABLET (40 MG TOTAL) BY MOUTH DAILY AS NEEDED FOR FLUID OR EDEMA. 90 tablet 3   gabapentin (NEURONTIN) 300 MG capsule Take 1 capsule (300 mg total) by mouth 3 (three) times daily. 270 capsule 1   metoprolol tartrate (LOPRESSOR) 50 MG tablet Take 1 tablet (50 mg total) by mouth 2 (two) times daily. 60 tablet 11   Multiple Vitamins-Minerals (ONE-A-DAY WOMENS 50+) TABS Take 1 tablet by mouth daily.     omeprazole (PRILOSEC) 20 MG capsule TAKE 1 CAPSULE BY MOUTH EVERY DAY 90 capsule 3   sodium chloride (OCEAN) 0.65 % SOLN nasal spray Place 1 spray into both nostrils as needed for congestion.     No current facility-administered medications for this visit.    Allergies  Allergen Reactions   Tape Other (See Comments)    Band aides, adhesive tape Redness and pulls skin off   Tramadol Other (See Comments)    Pt has prolonged Qtc interval of 540- cannot give tramadol per pharmacy   Morphine Itching and Rash    Flushing    REVIEW OF SYSTEMS (negative unless checked):   Cardiac:  []   Chest pain or chest pressure? []  Shortness of breath upon activity? []  Shortness of  breath when lying flat? []  Irregular heart rhythm?  Vascular:  []  Pain in calf, thigh, or hip brought on by walking? []  Pain in feet at night that wakes you up from your sleep? []  Blood clot in your veins? [x]  Leg swelling?  Pulmonary:  []  Oxygen at home? []  Productive cough? []  Wheezing?  Neurologic:  []  Sudden weakness in arms or legs? []  Sudden numbness in arms or legs? []  Sudden onset of difficult speaking or slurred speech? []  Temporary loss of vision in one eye? []  Problems with dizziness?  Gastrointestinal:  []  Blood in stool? []  Vomited blood?  Genitourinary:  []  Burning when urinating? []  Blood in urine?  Psychiatric:  []  Major depression  Hematologic:  []  Bleeding problems? []  Problems with blood clotting?  Dermatologic:  []  Rashes or ulcers?  Constitutional:  []  Fever or chills?  Ear/Nose/Throat:  []  Change in hearing? []  Nose bleeds? []  Sore throat?  Musculoskeletal:  []  Back pain? []  Joint pain? []  Muscle pain?   Physical Examination     Vitals:   08/30/21 1329  BP: (!) 170/89  Pulse: 79  Resp: 18  Temp: 98.2 F (36.8 C)  TempSrc: Temporal  SpO2: 96%  Weight: 160 lb 9.6 oz (72.8 kg)  Height: 5\' 1"  (1.549 m)   Body mass index is 30.35 kg/m.  General:  WDWN in NAD; vital signs documented above Gait: Normal, uses cane HENT: WNL, normocephalic Pulmonary: normal non-labored breathing , without Rales, rhonchi,  wheezing Cardiac: regular HR, without  Murmurs without carotid bruit Abdomen: soft, NT, no masses Vascular Exam/Pulses:2+ radial pulses, 2+ femoral, popliteal, DP and PT pulses bilaterally. Feet warm and well perfused Extremities: without varicose veins, with reticular veins of both lower legs, mild edema of left leg, without stasis pigmentation, without lipodermatosclerosis, without ulcers Musculoskeletal: no muscle wasting or atrophy  Neurologic: A&O X 3;  No focal weakness or paresthesias are detected Psychiatric:  The  pt has Normal affect.  Non-invasive Vascular Imaging   BLE Venous Insufficiency Duplex (08/30/21): LLE: No DVT and SVT No GSV reflux  GSV diameter <0.4 No SSV reflux  CFV, FV deep venous reflux   Medical Decision Making   Citlalli C Shanks is a 85 y.o. female who presents with: LLE chronic venous insufficiency with swelling and discoloration. Her duplex today shows only deep insufficiency in her left lower extremity. No DVT or SVT. No superficial venous reflux. Based on the patient's history and examination, I recommend knee high compression stockings 15-20 mmHg. She was measured for a pair at today's visit and purchased a pair to wear. Also instructed her on proper elevation. Discussed exercise and refraining from prolonged sitting or standing She will follow up as needed if she has new or worsening symptoms   , PA-C [facility] Office: [phone]  08/30/2021, 1:31 PM  Clinic MD: 

## 2021-09-13 ENCOUNTER — Other Ambulatory Visit: Payer: Self-pay

## 2021-09-13 ENCOUNTER — Encounter: Payer: Self-pay | Admitting: Cardiology

## 2021-09-13 ENCOUNTER — Ambulatory Visit: Payer: Medicare Other | Admitting: Cardiology

## 2021-09-13 VITALS — BP 148/80 | HR 72 | Ht 61.0 in | Wt 165.0 lb

## 2021-09-13 DIAGNOSIS — I471 Supraventricular tachycardia, unspecified: Secondary | ICD-10-CM

## 2021-09-13 NOTE — Progress Notes (Signed)
Electrophysiology Office Note   Date:  09/13/2021   ID:  Julie Rice, DOB [birth year], MRN [id number]  PCP:  [name], NP  Cardiologist:  Johnsie Cancel Primary Electrophysiologist:  Breeley Bischof Meredith Leeds, MD    Chief Complaint: SVT   History of Present Illness: Julie Rice is a 85 y.o. female who is being seen today for the evaluation of SVT at the request of [name], NP. Presenting today for electrophysiology evaluation.  She has a history of nonobstructive coronary artery disease, chronic systolic heart failure ejection fraction, fibromyalgia, hypertension, atrial fibrillation, TIA, COPD, esophageal strictures.  She presented to the hospital 04/03/2021 with sudden onset chest pain and palpitation.  She was found to be in a wide-complex tachycardia with heart rates in the 170s to 180s.  This was unresponsive to Cardizem.  She was cardioverted with restoration of sinus rhythm.  She reverted to wide-complex tachycardia again and was started on amiodarone.  She was taken for EP study and ablation 04/25/2021 which induced a narrow complex tachycardia which terminated with adenosine.  Ablation was performed for ORT at the floor of the coronary sinus.  Today, denies symptoms of palpitations, chest pain, shortness of breath, orthopnea, PND, lower extremity edema, claudication, dizziness, presyncope, syncope, bleeding, or neurologic sequela. The patient is tolerating medications without difficulties.  Since being seen she has done well.  She has noted no further palpitations.  She does not feel that she has had any further episodes of SVT.  She is overall comfortable with her control.  Her only complaint is leg pain.  Her primary physician is working up her leg pain and has ordered vascular studies.   Past Medical History:  Diagnosis Date   A-fib ([place])    Anemia    Anxiety    Bipolar disorder ([place])    Blood transfusion 1991   autologous pts own blood given    CAD (coronary artery  disease)    Cataract    Chest pain    "@ rest, lying down, w/exertion"   CHF (congestive heart failure) ([place]) 07/2020   Chronic back pain    "mostly lower back but I do have upper back pain regularly" (04/06/2018)   COPD (chronic obstructive pulmonary disease) ([facility])    Depression    Esophageal stricture    Fibromyalgia    GERD (gastroesophageal reflux disease)    Heart murmur    "slight" (04/06/2018)   Hiatal hernia    Hyperlipidemia    Patient denies   Hypertension    Internal hemorrhoids    Lumbago    Mitral regurgitation    Osteoarthritis    Osteoporosis    Pneumonia ~ 01/2018   PONV (postoperative nausea and vomiting)    severe ponv, "in the past" (04/06/2018)   Rectal bleeding    Rheumatoid arthritis ([place])    Spondylosis    TIA (transient ischemic attack) 2013   Urinary incontinence    wears depends   Past Surgical History:  Procedure Laterality Date   ABDOMINAL HYSTERECTOMY  1982   BACK SURGERY     BALLOON DILATION N/A 09/21/2018   Procedure: BALLOON DILATION;  Surgeon: Irene Shipper, MD;  Location: [facility] ENDOSCOPY;  Service: Endoscopy;  Laterality: N/A;   BALLOON DILATION N/A 08/12/2019   Procedure: BALLOON DILATION;  Surgeon: Jackquline Denmark, MD;  Location: [facility] ENDOSCOPY;  Service: Endoscopy;  Laterality: N/A;   BIOPSY  08/12/2019   Procedure: BIOPSY;  Surgeon: Jackquline Denmark, MD;  Location: [facility];  Service:  Endoscopy;;   BLADDER SUSPENSION  1980's   BOTOX INJECTION N/A 09/21/2018   Procedure: BOTOX INJECTION;  Surgeon: Hilarie Fredrickson, MD;  Location: [facility] ENDOSCOPY;  Service: Endoscopy;  Laterality: N/A;   BOTOX INJECTION N/A 08/12/2019   Procedure: BOTOX INJECTION;  Surgeon: Lynann Bologna, MD;  Location: [facility] ENDOSCOPY;  Service: Endoscopy;  Laterality: N/A;   CATARACT EXTRACTION W/ INTRAOCULAR LENS  IMPLANT, BILATERAL Bilateral 2010   DILATION AND CURETTAGE OF UTERUS  1961   ESOPHAGEAL MANOMETRY N/A 03/25/2018   Procedure: ESOPHAGEAL MANOMETRY (EM);  Surgeon: Napoleon Form, MD;  Location: [facility] ENDOSCOPY;  Service: Endoscopy;  Laterality: N/A;   ESOPHAGOGASTRODUODENOSCOPY (EGD) WITH PROPOFOL N/A 04/07/2018   Procedure: ESOPHAGOGASTRODUODENOSCOPY (EGD) WITH PROPOFOL;  Surgeon: Sherrilyn Rist, MD;  Location[facility] ENDOSCOPY;  Service: Gastroenterology;  Laterality: N/A;   ESOPHAGOGASTRODUODENOSCOPY (EGD) WITH PROPOFOL N/A 09/21/2018   Procedure: ESOPHAGOGASTRODUODENOSCOPY (EGD) WITH PROPOFOL;  Surgeon: Hilarie Fredrickson, MD;  Location: [facility] ENDOSCOPY;  Service: Endoscopy;  Laterality: N/A;   ESOPHAGOGASTRODUODENOSCOPY (EGD) WITH PROPOFOL N/A 08/12/2019   Procedure: ESOPHAGOGASTRODUODENOSCOPY (EGD) WITH PROPOFOL;  Surgeon: Lynann Bologna, MD;  Location: [facility] ENDOSCOPY;  Service: Endoscopy;  Laterality: N/A;   HERNIA REPAIR     HIATAL HERNIA REPAIR N/A 08/02/2016   Procedure: LAPAROSCOPIC REPAIR OF LARGE  HIATAL HERNIA;  Surgeon: Glenna Fellows, MD;  Location: [facility] ORS;  Service: General;  Laterality: N/A;   JOINT REPLACEMENT     LAPAROSCOPIC NISSEN FUNDOPLICATION N/A 08/02/2016   Procedure: LAPAROSCOPIC NISSEN FUNDOPLICATION;  Surgeon: Glenna Fellows, MD;  Location: [facility] ORS;  Service: General;  Laterality: N/A;   LUMBAR LAMINECTOMY  1990; 1994; [id number]   "I've got 2 stainless steel rods; 6 screws; 2 ray cages"took bone from right hip to put in back   RIGHT/LEFT HEART CATH AND CORONARY ANGIOGRAPHY N/A 07/26/2019   Procedure: RIGHT/LEFT HEART CATH AND CORONARY ANGIOGRAPHY;  Surgeon: Lyn Records, MD;  Location: [facility] INVASIVE CV LAB;  Service: Cardiovascular;  Laterality: N/A;   SVT ABLATION N/A 04/25/2021   Procedure: SVT ABLATION;  Surgeon: Regan Lemming, MD;  Location: [facility] INVASIVE CV LAB;  Service: Cardiovascular;  Laterality: N/A;   TEE WITHOUT CARDIOVERSION N/A 08/19/2019   Procedure: TRANSESOPHAGEAL ECHOCARDIOGRAM (TEE);  Surgeon: Lewayne Bunting, MD;  Location: [facility] ENDOSCOPY;  Service: Cardiovascular;  Laterality: N/A;   TONSILLECTOMY AND ADENOIDECTOMY  1945    TOTAL KNEE ARTHROPLASTY Right ~ 1996   TUBAL LIGATION  ~ 1976     Current Outpatient Medications  Medication Sig Dispense Refill   acetaminophen (TYLENOL) 500 MG tablet Take 1,000 mg by mouth every 6 (six) hours as needed for moderate pain, headache or fever.     albuterol (VENTOLIN HFA) 108 (90 Base) MCG/ACT inhaler TAKE 2 PUFFS BY MOUTH EVERY 6 HOURS AS NEEDED FOR WHEEZE OR SHORTNESS OF BREATH 8 g 2   Carboxymethylcellulose Sodium (REFRESH TEARS OP) Place 1 drop into both eyes 2 (two) times daily.     cetirizine (ZYRTEC) 10 MG tablet Take 10 mg by mouth daily as needed for allergies.     clonazePAM (KLONOPIN) 0.5 MG tablet TAKE 1 TABLET BY MOUTH TWICE A DAY AS NEEDED FOR ANXIETY 60 tablet 2   DULoxetine (CYMBALTA) 60 MG capsule TAKE 1 CAPSULE BY MOUTH EVERY DAY 90 capsule 3   ELIQUIS 5 MG TABS tablet TAKE 1 TABLET BY MOUTH TWICE A DAY 180 tablet 1   furosemide (LASIX) 40 MG tablet TAKE 1 TABLET (40 MG TOTAL) BY MOUTH DAILY  AS NEEDED FOR FLUID OR EDEMA. 90 tablet 3   metoprolol tartrate (LOPRESSOR) 50 MG tablet Take 1 tablet (50 mg total) by mouth 2 (two) times daily. 60 tablet 11   Multiple Vitamins-Minerals (ONE-A-DAY WOMENS 50+) TABS Take 1 tablet by mouth daily.     omeprazole (PRILOSEC) 20 MG capsule TAKE 1 CAPSULE BY MOUTH EVERY DAY 90 capsule 3   sodium chloride (OCEAN) 0.65 % SOLN nasal spray Place 1 spray into both nostrils as needed for congestion.     gabapentin (NEURONTIN) 300 MG capsule Take 1 capsule (300 mg total) by mouth 3 (three) times daily. 270 capsule 1   No current facility-administered medications for this visit.    Allergies:   Tape, Tramadol, and Morphine   Social History:  The patient  reports that she has never smoked. She has never used smokeless tobacco. She reports that she does not drink alcohol and does not use drugs.   Family History:  The patient's family history includes Heart attack in her brother; Heart disease in her brother; Heart disease (age of  onset: 72) in her father; Hypertension in her mother; Kidney disease in her mother; Suicidality in her son.   ROS:  Please see the history of present illness.   Otherwise, review of systems is positive for none.   All other systems are reviewed and negative.   PHYSICAL EXAM: VS:  BP (!) 148/80   Pulse 72   Ht 5\' 1"  (1.549 m)   Wt 165 lb (74.8 kg)   SpO2 96%   BMI 31.18 kg/m  , BMI Body mass index is 31.18 kg/m. GEN: Well nourished, well developed, in no acute distress  HEENT: normal  Neck: no JVD, carotid bruits, or masses Cardiac: RRR; no murmurs, rubs, or gallops,no edema  Respiratory:  clear to auscultation bilaterally, normal work of breathing GI: soft, nontender, nondistended, + BS MS: no deformity or atrophy  Skin: warm and dry Neuro:  Strength and sensation are intact Psych: euthymic mood, full affect  EKG:  EKG is ordered today. Personal review of the ekg ordered shows sinus rhythm, rate 72  Recent Labs: 02/12/2021: Brain Natriuretic Peptide 89 04/04/2021: Magnesium 2.0; TSH 1.588 04/05/2021: BUN 14; Creatinine, Ser 0.88; Hemoglobin 10.8; Platelets 234; Potassium 4.7; Sodium 135    Lipid Panel     Component Value Date/Time   CHOL 172 03/09/2020 0936   TRIG 111.0 03/09/2020 0936   HDL 65.60 03/09/2020 0936   CHOLHDL 3 03/09/2020 0936   VLDL 22.2 03/09/2020 0936   LDLCALC 84 03/09/2020 0936   LDLDIRECT 122.2 06/01/2013 0944     Wt Readings from Last 3 Encounters:  09/13/21 165 lb (74.8 kg)  08/30/21 160 lb 9.6 oz (72.8 kg)  07/06/21 165 lb 12.8 oz (75.2 kg)      Other studies Reviewed: Additional studies/ records that were reviewed today include: TTE 04/04/21  Review of the above records today demonstrates:   1. Left ventricular ejection fraction, by estimation, is 60 to 65%. The  left ventricle has normal function. Left ventricular endocardial border  not optimally defined to evaluate regional wall motion. There is mild left  ventricular hypertrophy. Left   ventricular diastolic parameters are consistent with Grade I diastolic  dysfunction (impaired relaxation).   2. Right ventricular systolic function is normal. The right ventricular  size is normal. There is normal pulmonary artery systolic pressure. The  estimated right ventricular systolic pressure is 29.0 mmHg.   3. The mitral valve is degenerative.  Trivial mitral valve regurgitation.  No evidence of mitral stenosis. Moderate mitral annular calcification.   4. The aortic valve is normal in structure. There is mild calcification  of the aortic valve. Aortic valve regurgitation is mild. No aortic  stenosis is present.   5. The inferior vena cava is normal in size with greater than 50%  respiratory variability, suggesting right atrial pressure of 3 mmHg.   ASSESSMENT AND PLAN:  1.  SVT: Found to have ORT with ablation performed in the coronary sinus.  Currently on metoprolol 50 mg twice daily.  She has fortunately not had any further episodes of SVT.  She has noted no palpitations.  She currently feels well.  We Macyn Remmert continue with current management.  2.  Nonobstructive coronary artery disease: No current chest pain.  3.  Hypertension: Mildly elevated today.  Usually well controlled.  No changes.  4.  Persistent atrial fibrillation: Status post cardioversion 10/24/2020.  Currently on Eliquis.  CHA2DS2-VASc of 7.  Current medicines are reviewed at length with the patient today.   The patient does not have concerns regarding her medicines.  The following changes were made today: None  Labs/ tests ordered today include:  Orders Placed This Encounter  Procedures   EKG 12-Lead      Disposition:   FU with EP app 6 months  Signed, Theus Espin Meredith Leeds, MD  09/13/2021 2:07 PM     [address] [place] [address] [phone] (office) [phone] (fax)

## 2021-09-13 NOTE — Patient Instructions (Signed)
Medication Instructions:  Your physician recommends that you continue on your current medications as directed. Please refer to the Current Medication list given to you today.  *If you need a refill on your cardiac medications before your next appointment, please call your pharmacy*   Lab Work: None ordered   Testing/Procedures: None ordered   Follow-Up: At [facility], you and your health needs are our priority.  As part of our continuing mission to provide you with exceptional heart care, we have created designated Provider Care Teams.  These Care Teams include your primary Cardiologist (physician) and Advanced Practice Providers (APPs -  Physician Assistants and Nurse Practitioners) who all work together to provide you with the care you need, when you need it.  Your next appointment:   6 month(s)  The format for your next appointment:   In Person  Provider:   You will see one of the following Advanced Practice Providers on your designated Care Team:   Renee Ursuy, PA-C Michael "Andy" Tillery, PA-C   Thank you for choosing [facility]!!   Jayzen Paver, RN [phone]         

## 2021-09-25 DIAGNOSIS — J9611 Chronic respiratory failure with hypoxia: Secondary | ICD-10-CM | POA: Diagnosis not present

## 2021-09-25 DIAGNOSIS — J449 Chronic obstructive pulmonary disease, unspecified: Secondary | ICD-10-CM | POA: Diagnosis not present

## 2021-09-28 ENCOUNTER — Other Ambulatory Visit: Payer: Self-pay | Admitting: Adult Health

## 2021-09-28 DIAGNOSIS — Z760 Encounter for issue of repeat prescription: Secondary | ICD-10-CM

## 2021-10-11 ENCOUNTER — Ambulatory Visit: Payer: Medicare Other | Admitting: Adult Health

## 2021-10-12 ENCOUNTER — Ambulatory Visit: Payer: Medicare Other | Admitting: Adult Health

## 2021-10-16 ENCOUNTER — Ambulatory Visit (INDEPENDENT_AMBULATORY_CARE_PROVIDER_SITE_OTHER): Payer: Medicare Other | Admitting: Adult Health

## 2021-10-16 ENCOUNTER — Encounter: Payer: Self-pay | Admitting: Adult Health

## 2021-10-16 VITALS — BP 120/58 | Temp 97.0°F | Ht 61.0 in | Wt 158.0 lb

## 2021-10-16 DIAGNOSIS — I872 Venous insufficiency (chronic) (peripheral): Secondary | ICD-10-CM

## 2021-10-16 DIAGNOSIS — F339 Major depressive disorder, recurrent, unspecified: Secondary | ICD-10-CM | POA: Diagnosis not present

## 2021-10-16 DIAGNOSIS — N3000 Acute cystitis without hematuria: Secondary | ICD-10-CM

## 2021-10-16 LAB — URINALYSIS, ROUTINE W REFLEX MICROSCOPIC
Bilirubin Urine: NEGATIVE
Nitrite: NEGATIVE
Specific Gravity, Urine: 1.02 (ref 1.000–1.030)
Total Protein, Urine: NEGATIVE
Urine Glucose: NEGATIVE
Urobilinogen, UA: 0.2 (ref 0.0–1.0)
pH: 5.5 (ref 5.0–8.0)

## 2021-10-16 MED ORDER — BUPROPION HCL ER (XL) 150 MG PO TB24
150.0000 mg | ORAL_TABLET | Freq: Every day | ORAL | 0 refills | Status: DC
Start: 2021-10-16 — End: 2021-11-30

## 2021-10-16 NOTE — Addendum Note (Signed)
Addended by: Waymon Amato R on: 10/16/2021 04:58 PM   Modules accepted: Orders

## 2021-10-16 NOTE — Progress Notes (Signed)
Subjective:    Patient ID: Julie Rice, female    DOB: Nov 04, 1936, 85 y.o.   MRN: [id number]  HPI 85 year old female who  has a past medical history of A-fib (HCC), Anemia, Anxiety, Bipolar disorder (HCC), Blood transfusion (1991), CAD (coronary artery disease), Cataract, Chest pain, CHF (congestive heart failure) (HCC) (07/2020), Chronic back pain, COPD (chronic obstructive pulmonary disease) (HCC), Depression, Esophageal stricture, Fibromyalgia, GERD (gastroesophageal reflux disease), Heart murmur, Hiatal hernia, Hyperlipidemia, Hypertension, Internal hemorrhoids, Lumbago, Mitral regurgitation, Osteoarthritis, Osteoporosis, Pneumonia (~ 01/2018), PONV (postoperative nausea and vomiting), Rectal bleeding, Rheumatoid arthritis (HCC), Spondylosis, TIA (transient ischemic attack) (2013), and Urinary incontinence.  She presents to the office today for follow up after being seen by vein and vascular for BLE edema and discoloration about 6 weeks ago. . She had a duplex done that showed only deep insufficiency in her left lower extremity. No DVT or SVT. No superficial venous reflux. Was advised to wear knee high compression socks 15-20 mmhg. She was measured and bought a pair. She reports that she has not been able to wear them d/t not being able to get them on. Her legs continue to swell throughout the day and she often feels like she has pins and needles in her feet despite taking Gabapentin 300 mg TID   She is meeting with orthopedics tomorrow to discuss her chronic knee pain from osteoarthritis.   Today she would like to discuss ongoing depression. Her depression is usually worse around the holidays d/t losing loved ones but she feels as though with her chronic pain and not being able to leave the house d/t ambulation issues coupled with the rest of her chronic health issues she feels more depressed. Was on Cymbalta for chronic pain but this has not been filled in about a month. She is worried that she  may not be alive d/t her health by next christmas. She does report SI but has no plan and states " I would never do anything, I am too chicken"  She is also worried she may have a UTI. Denies symptoms. " I feel like I may have one"   Review of Systems See HPI   Past Medical History:  Diagnosis Date   A-fib (HCC)    Anemia    Anxiety    Bipolar disorder (HCC)    Blood transfusion 1991   autologous pts own blood given    CAD (coronary artery disease)    Cataract    Chest pain    "@ rest, lying down, w/exertion"   CHF (congestive heart failure) (HCC) 07/2020   Chronic back pain    "mostly lower back but I do have upper back pain regularly" (04/06/2018)   COPD (chronic obstructive pulmonary disease) (HCC)    Depression    Esophageal stricture    Fibromyalgia    GERD (gastroesophageal reflux disease)    Heart murmur    "slight" (04/06/2018)   Hiatal hernia    Hyperlipidemia    Patient denies   Hypertension    Internal hemorrhoids    Lumbago    Mitral regurgitation    Osteoarthritis    Osteoporosis    Pneumonia ~ 01/2018   PONV (postoperative nausea and vomiting)    severe ponv, "in the past" (04/06/2018)   Rectal bleeding    Rheumatoid arthritis (HCC)    Spondylosis    TIA (transient ischemic attack) 2013   Urinary incontinence    wears depends    Social History  Socioeconomic History   Marital status: Widowed    Spouse name: Not on file   Number of children: 2   Years of education: Not on file   Highest education level: Not on file  Occupational History   Occupation: retired    Fish farm manager: RETIRED  Tobacco Use   Smoking status: Never   Smokeless tobacco: Never  Vaping Use   Vaping Use: Never used  Substance and Sexual Activity   Alcohol use: Never   Drug use: Never   Sexual activity: Not Currently    Comment: 1st intercourse 43 yo-1 partner  Other Topics Concern   Not on file  Social History Narrative   Retired    Widowed   Social Determinants of Adult nurse Strain: Low Risk    Difficulty of Paying Living Expenses: Not hard at all  Food Insecurity: No Food Insecurity   Worried About Charity fundraiser in the Last Year: Never true   Arboriculturist in the Last Year: Never true  Transportation Needs: No Transportation Needs   Lack of Transportation (Medical): No   Lack of Transportation (Non-Medical): No  Physical Activity: Inactive   Days of Exercise per Week: 0 days   Minutes of Exercise per Session: 0 min  Stress: Stress Concern Present   Feeling of Stress : To some extent  Social Connections: Socially Isolated   Frequency of Communication with Friends and Family: Twice a week   Frequency of Social Gatherings with Friends and Family: Never   Attends Religious Services: 1 to 4 times per year   Active Member of [employer] or Organizations: No   Attends Archivist Meetings: Never   Marital Status: Widowed  Human resources officer Violence: Not At Risk   Fear of Current or Ex-Partner: No   Emotionally Abused: No   Physically Abused: No   Sexually Abused: No    Past Surgical History:  Procedure Laterality Date   ABDOMINAL HYSTERECTOMY  1982   BACK SURGERY     BALLOON DILATION N/A 09/21/2018   Procedure: BALLOON DILATION;  Surgeon: Irene Shipper, MD;  Location: [facility];  Service: Endoscopy;  Laterality: N/A;   BALLOON DILATION N/A 08/12/2019   Procedure: BALLOON DILATION;  Surgeon: Jackquline Denmark, MD;  Location[facility] ENDOSCOPY;  Service: Endoscopy;  Laterality: N/A;   BIOPSY  08/12/2019   Procedure: BIOPSY;  Surgeon: Jackquline Denmark, MD;  Location: [place];  Service: Endoscopy;;   BLADDER SUSPENSION  1980's   BOTOX INJECTION N/A 09/21/2018   Procedure: BOTOX INJECTION;  Surgeon: Irene Shipper, MD;  Location: [facility];  Service: Endoscopy;  Laterality: N/A;   BOTOX INJECTION N/A 08/12/2019   Procedure: BOTOX INJECTION;  Surgeon: Jackquline Denmark, MD;  Location: [facility] ENDOSCOPY;  Service: Endoscopy;  Laterality:  N/A;   CATARACT EXTRACTION W/ INTRAOCULAR LENS  IMPLANT, BILATERAL Bilateral 2010   DILATION AND CURETTAGE OF UTERUS  1961   ESOPHAGEAL MANOMETRY N/A 03/25/2018   Procedure: ESOPHAGEAL MANOMETRY (EM);  Surgeon: Mauri Pole, MD;  Location: [facility];  Service: Endoscopy;  Laterality: N/A;   ESOPHAGOGASTRODUODENOSCOPY (EGD) WITH PROPOFOL N/A 04/07/2018   Procedure: ESOPHAGOGASTRODUODENOSCOPY (EGD) WITH PROPOFOL;  Surgeon: Doran Stabler, MD;  Location: [place];  Service: Gastroenterology;  Laterality: N/A;   ESOPHAGOGASTRODUODENOSCOPY (EGD) WITH PROPOFOL N/A 09/21/2018   Procedure: ESOPHAGOGASTRODUODENOSCOPY (EGD) WITH PROPOFOL;  Surgeon: Irene Shipper, MD;  Location: [facility];  Service: Endoscopy;  Laterality: N/A;   ESOPHAGOGASTRODUODENOSCOPY (EGD) WITH PROPOFOL  N/A 08/12/2019   Procedure: ESOPHAGOGASTRODUODENOSCOPY (EGD) WITH PROPOFOL;  Surgeon: Jackquline Denmark, MD;  Location[facility] END[facility]COPY;  Service: Endoscopy;  Laterality: N/A;   HERNIA REPAIR     HIATAL HERNIA REPAIR N/A 08/02/2016   Procedure: LAPAROSCOPIC REPAIR OF LARGE  HIATAL HERNIA;  Surgeon: Excell Seltzer, MD;  Location: [facility];  Service: General;  Laterality: N/A;   JOINT REPLACEMENT     LAPAROSCOPIC NISSEN FUNDOPLICATION N/A [id number]   Procedure: LAPAROSCOPIC NISSEN FUNDOPLICATION;  Surgeon: Excell Seltzer, MD;  Location: [facility];  Service: General;  Laterality: N/A;   [place]; [id number]   "I've got 2 stainless steel rods; 6 screws; 2 ray cages"took bone from right hip to put in back   RIGHT/LEFT HEART CATH AND CORONARY ANGIOGRAPHY N/A 07/26/2019   Procedure: RIGHT/LEFT HEART CATH AND CORONARY ANGIOGRAPHY;  Surgeon: Belva Crome, MD;  Location: [place] CV LAB;  Service: Cardiovascular;  Laterality: N/A;   SVT ABLATION N/A 04/25/2021   Procedure: SVT ABLATION;  Surgeon: Constance Haw, MD;  Location: [place] [facility];  Service: Cardiovascular;  Laterality: N/A;   TEE WITHOUT  CARDIOVERSION N/A 08/19/2019   Procedure: TRANSESOPHAGEAL ECHOCARDIOGRAM (TEE);  Surgeon: Lelon Perla, MD;  Location: [facility] ENDOSCOPY;  Service: Cardiovascular;  Laterality: N/A;   TONSILLECTOMY AND ADENOIDECTOMY  1945   TOTAL KNEE ARTHROPLASTY Right ~ [place]  ~ 1976    Family History  Problem Relation Age of Onset   Kidney disease Mother    Hypertension Mother    Heart disease Father 20       die of MI at age 77   Suicidality Son    Heart disease Brother    Heart attack Brother    Colon cancer Neg Hx    Esophageal cancer Neg Hx    Pancreatic cancer Neg Hx    Rectal cancer Neg Hx    Stomach cancer Neg Hx     Allergies  Allergen Reactions   Tape Other (See Comments)    Band aides, adhesive tape Redness and pulls skin off   Tramadol Other (See Comments)    Pt has prolonged Qtc interval of 540- cannot give tramadol per pharmacy   Morphine Itching and Rash    Flushing    Current Outpatient Medications on File Prior to Visit  Medication Sig Dispense Refill   acetaminophen (TYLENOL) 500 MG tablet Take 1,000 mg by mouth every 6 (six) hours as needed for moderate pain, headache or fever.     albuterol (VENTOLIN HFA) 108 (90 Base) MCG/ACT inhaler TAKE 2 PUFFS BY MOUTH EVERY 6 HOURS AS NEEDED FOR WHEEZE OR SHORTNESS OF BREATH 8 g 2   Carboxymethylcellulose Sodium (REFRESH TEARS OP) Place 1 drop into both eyes 2 (two) times daily.     cetirizine (ZYRTEC) 10 MG tablet Take 10 mg by mouth daily as needed for allergies.     clonazePAM (KLONOPIN) 0.5 MG tablet TAKE 1 TABLET BY MOUTH TWICE A DAY AS NEEDED FOR ANXIETY 60 tablet 2   DULoxetine (CYMBALTA) 60 MG capsule TAKE 1 CAPSULE BY MOUTH EVERY DAY 90 capsule 3   ELIQUIS 5 MG TABS tablet TAKE 1 TABLET BY MOUTH TWICE A DAY 180 tablet 1   furosemide (LASIX) 40 MG tablet TAKE 1 TABLET (40 MG TOTAL) BY MOUTH DAILY AS NEEDED FOR FLUID OR EDEMA. 90 tablet 3   metoprolol tartrate (LOPRESSOR) 50 MG tablet Take 1 tablet (50 mg  total) by mouth 2 (  two) times daily. 60 tablet 11   Multiple Vitamins-Minerals (ONE-A-DAY WOMENS 50+) TABS Take 1 tablet by mouth daily.     omeprazole (PRILOSEC) 20 MG capsule TAKE 1 CAPSULE BY MOUTH EVERY DAY 90 capsule 3   sodium chloride (OCEAN) 0.65 % SOLN nasal spray Place 1 spray into both nostrils as needed for congestion.     gabapentin (NEURONTIN) 300 MG capsule Take 1 capsule (300 mg total) by mouth 3 (three) times daily. 270 capsule 1   No current facility-administered medications on file prior to visit.    Temp (!) 97 F (36.1 C) (Temporal)    Ht 5\' 1"  (1.549 m)    Wt 158 lb (71.7 kg)    BMI 29.85 kg/m       Objective:   Physical Exam Vitals and nursing note reviewed.  Constitutional:      Appearance: Normal appearance.  Cardiovascular:     Rate and Rhythm: Normal rate and regular rhythm.     Pulses: Normal pulses.     Heart sounds: Normal heart sounds.  Pulmonary:     Effort: Pulmonary effort is normal.  Musculoskeletal:        General: Tenderness present.     Right lower leg: 2+ Edema present.     Left lower leg: 2+ Edema present.  Skin:    General: Skin is warm and dry.     Capillary Refill: Capillary refill takes less than 2 seconds.  Neurological:     General: No focal deficit present.     Mental Status: She is alert and oriented to person, place, and time.  Psychiatric:        Mood and Affect: Mood is depressed. Affect is tearful.        Behavior: Behavior normal.        Thought Content: Thought content includes suicidal ideation. Thought content does not include homicidal or suicidal plan.        Judgment: Judgment normal.      Assessment & Plan:  1. Depression, recurrent ([place]) PHQ9 SCORE ONLY 10/16/2021 12/19/2020 10/13/2020  PHQ-9 Total Score 22 1 0  -StartWill start on Wellbutrin 150 mg XR - Advised if she has active SI then stop the medication and go to the ER - Follow up in one month or sooner if needed  - buPROPion (WELLBUTRIN XL) 150 MG 24  hr tablet; Take 1 tablet (150 mg total) by mouth daily.  Dispense: 90 tablet; Refill: 0  2. Acute cystitis without hematuria  - Urinalysis; Future - Urinalysis  3. Venous insufficiency - Encouraged elevation of legs and before they start swelling to put on compression socks   [name], NP

## 2021-10-16 NOTE — Patient Instructions (Signed)
It was great seeing you today   I am going to start you on Wellbutrin for your depression. Please follow up in one month or sooner if needed

## 2021-10-16 NOTE — Addendum Note (Signed)
Addended by: Waymon Amato R on: 10/16/2021 04:57 PM   Modules accepted: Orders

## 2021-10-17 ENCOUNTER — Other Ambulatory Visit: Payer: Medicare Other

## 2021-10-17 ENCOUNTER — Other Ambulatory Visit: Payer: Self-pay | Admitting: Physical Medicine and Rehabilitation

## 2021-10-17 DIAGNOSIS — M25562 Pain in left knee: Secondary | ICD-10-CM | POA: Diagnosis not present

## 2021-10-17 DIAGNOSIS — N3000 Acute cystitis without hematuria: Secondary | ICD-10-CM | POA: Diagnosis not present

## 2021-10-17 DIAGNOSIS — Z96651 Presence of right artificial knee joint: Secondary | ICD-10-CM | POA: Diagnosis not present

## 2021-10-17 DIAGNOSIS — M1712 Unilateral primary osteoarthritis, left knee: Secondary | ICD-10-CM | POA: Diagnosis not present

## 2021-10-18 ENCOUNTER — Telehealth: Payer: Self-pay | Admitting: Adult Health

## 2021-10-18 NOTE — Telephone Encounter (Addendum)
[place] psychiatrist PA with [facility] is calling and would like cory to return her call concerning patient mental health. Lynelle Smoke said the pharm told her cory did not approve pt cymbalta however the pt is on wellbutrin. Pt would like to know if that is correct she thought she suppose to take both medication. Pt last seen cory on 10-16-2021. Please advise

## 2021-10-19 ENCOUNTER — Telehealth: Payer: Self-pay | Admitting: Adult Health

## 2021-10-19 ENCOUNTER — Other Ambulatory Visit: Payer: Self-pay | Admitting: Adult Health

## 2021-10-19 ENCOUNTER — Telehealth: Payer: Self-pay

## 2021-10-19 LAB — URINE CULTURE
MICRO NUMBER:: 12756421
SPECIMEN QUALITY:: ADEQUATE

## 2021-10-19 MED ORDER — AMOXICILLIN-POT CLAVULANATE 875-125 MG PO TABS: 1.0000 | ORAL_TABLET | Freq: Two times a day (BID) | ORAL | 0 refills | Status: AC

## 2021-10-19 NOTE — Telephone Encounter (Signed)
Pt called stating that her leg and foot was in so much pain from a fall. Pt stated that she visited an UC but was not prescribed anything for pain. Pt was wanting to know if she could get something called in for pain. [name] spoke to pt verbally.

## 2021-10-19 NOTE — Telephone Encounter (Signed)
Updated patient on urine culture. She has a UTI that is sensitive to Augmentin. Abx has been sent in

## 2021-10-20 ENCOUNTER — Other Ambulatory Visit: Payer: Self-pay | Admitting: Cardiovascular Disease

## 2021-10-22 ENCOUNTER — Telehealth: Payer: Self-pay | Admitting: Cardiovascular Disease

## 2021-10-22 NOTE — Telephone Encounter (Signed)
Pt c/o swelling: STAT is pt has developed SOB within 24 hours  If swelling, where is the swelling located? Both legs & feet  How much weight have you gained and in what time span? N/A  Have you gained 3 pounds in a day or 5 pounds in a week? N/A  Do you have a log of your daily weights (if so, list)? No   Are you currently taking a fluid pill? Yes  Are you currently SOB? Yes   Have you traveled recently? no

## 2021-10-22 NOTE — Telephone Encounter (Signed)
Called patient with Dr. Fabio Bering advisement. Per Dr. Eden Emms, PCP should see can consider abdominal CT per primary. Patient stated she has appointment with PCP tomorrow and will let them know.

## 2021-10-22 NOTE — Telephone Encounter (Signed)
Eliquis 5 mg refill request received. Patient is 85 years old, weight-71.7 kg, Crea- 0.88 on 09/13/21, Diagnosis-SVT, and last seen by Dr. Elberta Fortis on 09/13/21. Dose is appropriate based on dosing criteria. Will send in refill to requested pharmacy.

## 2021-10-22 NOTE — Telephone Encounter (Signed)
Call sent straight to triage. Discuss to patient about her message. Patient complaining of continues pain in her BLE that goes up her leg to the groin/hip and then radiates up her back. Patient does have BLE edema, but she has been taking Lasix 40 mg daily and stated edema goes down during the night. Patient stated the pain is worse when she stands up. Patient had [place] to check for DVT and she had no DVT (on Eliquis 5 mg BID). Patient's BP and HR are normal. Patient stated her home health nurse told her to call her cardiologist. Informed patient that this is most likely something her PCP would need to address, but we would send a message to Dr. Eden Emms for advisement.

## 2021-10-23 ENCOUNTER — Ambulatory Visit (INDEPENDENT_AMBULATORY_CARE_PROVIDER_SITE_OTHER): Payer: Medicare Other | Admitting: Adult Health

## 2021-10-23 ENCOUNTER — Other Ambulatory Visit: Payer: Self-pay

## 2021-10-23 ENCOUNTER — Ambulatory Visit (INDEPENDENT_AMBULATORY_CARE_PROVIDER_SITE_OTHER): Payer: Medicare Other

## 2021-10-23 VITALS — BP 124/88 | HR 70 | Temp 98.4°F

## 2021-10-23 DIAGNOSIS — R600 Localized edema: Secondary | ICD-10-CM | POA: Diagnosis not present

## 2021-10-23 DIAGNOSIS — M25551 Pain in right hip: Secondary | ICD-10-CM

## 2021-10-23 LAB — BASIC METABOLIC PANEL
BUN: 17 mg/dL (ref 6–23)
CO2: 35 mEq/L — ABNORMAL HIGH (ref 19–32)
Calcium: 9.2 mg/dL (ref 8.4–10.5)
Chloride: 101 mEq/L (ref 96–112)
Creatinine, Ser: 0.75 mg/dL (ref 0.40–1.20)
GFR: 73.43 mL/min (ref >60.00)
Glucose, Bld: 90 mg/dL (ref 70–99)
Potassium: 3.7 mEq/L (ref 3.5–5.1)
Sodium: 141 mEq/L (ref 135–145)

## 2021-10-23 NOTE — Progress Notes (Signed)
Subjective:    Patient ID: Julie Rice, female    DOB: 15-May-1936, 85 y.o.   MRN: [id number]  HPI 85 year old female who  has a past medical history of A-fib (HCC), Anemia, Anxiety, Bipolar disorder (HCC), Blood transfusion (1991), CAD (coronary artery disease), Cataract, Chest pain, CHF (congestive heart failure) (HCC) (07/2020), Chronic back pain, COPD (chronic obstructive pulmonary disease) (HCC), Depression, Esophageal stricture, Fibromyalgia, GERD (gastroesophageal reflux disease), Heart murmur, Hiatal hernia, Hyperlipidemia, Hypertension, Internal hemorrhoids, Lumbago, Mitral regurgitation, Osteoarthritis, Osteoporosis, Pneumonia (~ 01/2018), PONV (postoperative nausea and vomiting), Rectal bleeding, Rheumatoid arthritis (HCC), Spondylosis, TIA (transient ischemic attack) (2013), and Urinary incontinence.  She presents to the office today for continued pain in BLE.  She reports that the pain is more intense in her right side, pain starts in the lower leg and radiates up into the groin around to her lower back.  She also reports worsening swelling in her right leg as compared to her left.    She does have sick significant osteoarthritic issues of her lower back with history of multiple lumbar spine surgeries.  Cardiology recommended abdomen CT due to swelling of her lower extremities.  Review of Systems See HPI   Past Medical History:  Diagnosis Date   A-fib (HCC)    Anemia    Anxiety    Bipolar disorder (HCC)    Blood transfusion 1991   autologous pts own blood given    CAD (coronary artery disease)    Cataract    Chest pain    "@ rest, lying down, w/exertion"   CHF (congestive heart failure) (HCC) 07/2020   Chronic back pain    "mostly lower back but I do have upper back pain regularly" (04/06/2018)   COPD (chronic obstructive pulmonary disease) (HCC)    Depression    Esophageal stricture    Fibromyalgia    GERD (gastroesophageal reflux disease)    Heart murmur     "slight" (04/06/2018)   Hiatal hernia    Hyperlipidemia    Patient denies   Hypertension    Internal hemorrhoids    Lumbago    Mitral regurgitation    Osteoarthritis    Osteoporosis    Pneumonia ~ 01/2018   PONV (postoperative nausea and vomiting)    severe ponv, "in the past" (04/06/2018)   Rectal bleeding    Rheumatoid arthritis (HCC)    Spondylosis    TIA (transient ischemic attack) 2013   Urinary incontinence    wears depends    Social History   Socioeconomic History   Marital status: Widowed    Spouse name: Not on file   Number of children: 2   Years of education: Not on file   Highest education level: 12th grade  Occupational History   Occupation: retired    Associate Professor: RETIRED  Tobacco Use   Smoking status: Never   Smokeless tobacco: Never  Vaping Use   Vaping Use: Never used  Substance and Sexual Activity   Alcohol use: Never   Drug use: Never   Sexual activity: Not Currently    Comment: 1st intercourse 53 yo-1 partner  Other Topics Concern   Not on file  Social History Narrative   Retired    Widowed   Social Determinants of Corporate investment banker Strain: Low Risk    Difficulty of Paying Living Expenses: Not hard at all  Food Insecurity: No Food Insecurity   Worried About Programme researcher, broadcasting/film/video in the Last Year: Never true  Ran Out of Food in the Last Year: Never true  Transportation Needs: No Transportation Needs   Lack of Transportation (Medical): No   Lack of Transportation (Non-Medical): No  Physical Activity: Inactive   Days of Exercise per Week: 0 days   Minutes of Exercise per Session: 0 min  Stress: Stress Concern Present   Feeling of Stress : Very much  Social Connections: Socially Isolated   Frequency of Communication with Friends and Family: Once a week   Frequency of Social Gatherings with Friends and Family: Never   Attends Religious Services: More than 4 times per year   Active Member of [employer] or Organizations: No   Attends Theatre manager Meetings: Never   Marital Status: Widowed  Human resources officer Violence: Not At Risk   Fear of Current or Ex-Partner: No   Emotionally Abused: No   Physically Abused: No   Sexually Abused: No    Past Surgical History:  Procedure Laterality Date   ABDOMINAL HYSTERECTOMY  1982   BACK SURGERY     BALLOON DILATION N/A 09/21/2018   Procedure: BALLOON DILATION;  Surgeon: Irene Shipper, MD;  Location: [facility];  Service: Endoscopy;  Laterality: N/A;   BALLOON DILATION N/A 08/12/2019   Procedure: BALLOON DILATION;  Surgeon: Jackquline Denmark, MD;  Location: [facility] ENDOSCOPY;  Service: Endoscopy;  Laterality: N/A;   BIOPSY  08/12/2019   Procedure: BIOPSY;  Surgeon: Jackquline Denmark, MD;  Location: [place];  Service: Endoscopy;;   BLADDER SUSPENSION  1980's   BOTOX INJECTION N/A 09/21/2018   Procedure: BOTOX INJECTION;  Surgeon: Irene Shipper, MD;  Location: [facility];  Service: Endoscopy;  Laterality: N/A;   BOTOX INJECTION N/A 08/12/2019   Procedure: BOTOX INJECTION;  Surgeon: Jackquline Denmark, MD;  Location: [facility] ENDOSCOPY;  Service: Endoscopy;  Laterality: N/A;   CATARACT EXTRACTION W/ INTRAOCULAR LENS  IMPLANT, BILATERAL Bilateral 2010   DILATION AND CURETTAGE OF UTERUS  1961   ESOPHAGEAL MANOMETRY N/A 03/25/2018   Procedure: ESOPHAGEAL MANOMETRY (EM);  Surgeon: Mauri Pole, MD;  Location: [facility];  Service: Endoscopy;  Laterality: N/A;   ESOPHAGOGASTRODUODENOSCOPY (EGD) WITH PROPOFOL N/A 04/07/2018   Procedure: ESOPHAGOGASTRODUODENOSCOPY (EGD) WITH PROPOFOL;  Surgeon: Doran Stabler, MD;  Location: [place];  Service: Gastroenterology;  Laterality: N/A;   ESOPHAGOGASTRODUODENOSCOPY (EGD) WITH PROPOFOL N/A 09/21/2018   Procedure: ESOPHAGOGASTRODUODENOSCOPY (EGD) WITH PROPOFOL;  Surgeon: Irene Shipper, MD;  Location: [facility];  Service: Endoscopy;  Laterality: N/A;   ESOPHAGOGASTRODUODENOSCOPY (EGD) WITH PROPOFOL N/A 08/12/2019   Procedure:  ESOPHAGOGASTRODUODENOSCOPY (EGD) WITH PROPOFOL;  Surgeon: Jackquline Denmark, MD;  Location: [facility] ENDOSCOPY;  Service: Endoscopy;  Laterality: N/A;   HERNIA REPAIR     HIATAL HERNIA REPAIR N/A 08/02/2016   Procedure: LAPAROSCOPIC REPAIR OF LARGE  HIATAL HERNIA;  Surgeon: Excell Seltzer, MD;  Location: [facility];  Service: General;  Laterality: N/A;   JOINT REPLACEMENT     LAPAROSCOPIC NISSEN FUNDOPLICATION N/A [id number]   Procedure: LAPAROSCOPIC NISSEN FUNDOPLICATION;  Surgeon: Excell Seltzer, MD;  Location: [facility];  Service: General;  Laterality: N/A;   [place]; [id number]   "I've got 2 stainless steel rods; 6 screws; 2 ray cages"took bone from right hip to put in back   RIGHT/LEFT HEART CATH AND CORONARY ANGIOGRAPHY N/A 07/26/2019   Procedure: RIGHT/LEFT HEART CATH AND CORONARY ANGIOGRAPHY;  Surgeon: Belva Crome, MD;  Location: [place] CV LAB;  Service: Cardiovascular;  Laterality: N/A;   SVT ABLATION N/A 04/25/2021  Procedure: SVT ABLATION;  Surgeon: Constance Haw, MD;  Location: [place] CV LAB;  Service: Cardiovascular;  Laterality: N/A;   TEE WITHOUT CARDIOVERSION N/A 08/19/2019   Procedure: TRANSESOPHAGEAL ECHOCARDIOGRAM (TEE);  Surgeon: Lelon Perla, MD;  Location: [facility] ENDOSCOPY;  Service: Cardiovascular;  Laterality: N/A;   TONSILLECTOMY AND ADENOIDECTOMY  1945   TOTAL KNEE ARTHROPLASTY Right ~ [place]  ~ 1976    Family History  Problem Relation Age of Onset   Kidney disease Mother    Hypertension Mother    Heart disease Father 73       die of MI at age 10   Suicidality Son    Heart disease Brother    Heart attack Brother    Colon cancer Neg Hx    Esophageal cancer Neg Hx    Pancreatic cancer Neg Hx    Rectal cancer Neg Hx    Stomach cancer Neg Hx     Allergies  Allergen Reactions   Tape Other (See Comments)    Band aides, adhesive tape Redness and pulls skin off   Tramadol Other (See Comments)    Pt has prolonged  Qtc interval of 540- cannot give tramadol per pharmacy   Morphine Itching and Rash    Flushing    Current Outpatient Medications on File Prior to Visit  Medication Sig Dispense Refill   acetaminophen (TYLENOL) 500 MG tablet Take 1,000 mg by mouth every 6 (six) hours as needed for moderate pain, headache or fever.     albuterol (VENTOLIN HFA) 108 (90 Base) MCG/ACT inhaler TAKE 2 PUFFS BY MOUTH EVERY 6 HOURS AS NEEDED FOR WHEEZE OR SHORTNESS OF BREATH 8 g 2   amoxicillin-clavulanate (AUGMENTIN) 875-125 MG tablet Take 1 tablet by mouth 2 (two) times daily for 5 days. 10 tablet 0   buPROPion (WELLBUTRIN XL) 150 MG 24 hr tablet Take 1 tablet (150 mg total) by mouth daily. 90 tablet 0   Carboxymethylcellulose Sodium (REFRESH TEARS OP) Place 1 drop into both eyes 2 (two) times daily.     cetirizine (ZYRTEC) 10 MG tablet Take 10 mg by mouth daily as needed for allergies.     clonazePAM (KLONOPIN) 0.5 MG tablet TAKE 1 TABLET BY MOUTH TWICE A DAY AS NEEDED FOR ANXIETY 60 tablet 2   DULoxetine (CYMBALTA) 60 MG capsule TAKE 1 CAPSULE BY MOUTH EVERY DAY 90 capsule 3   ELIQUIS 5 MG TABS tablet TAKE 1 TABLET BY MOUTH TWICE A DAY 180 tablet 1   furosemide (LASIX) 40 MG tablet TAKE 1 TABLET (40 MG TOTAL) BY MOUTH DAILY AS NEEDED FOR FLUID OR EDEMA. 90 tablet 3   metoprolol tartrate (LOPRESSOR) 50 MG tablet Take 1 tablet (50 mg total) by mouth 2 (two) times daily. 60 tablet 11   Multiple Vitamins-Minerals (ONE-A-DAY WOMENS 50+) TABS Take 1 tablet by mouth daily.     omeprazole (PRILOSEC) 20 MG capsule TAKE 1 CAPSULE BY MOUTH EVERY DAY 90 capsule 3   sodium chloride (OCEAN) 0.65 % SOLN nasal spray Place 1 spray into both nostrils as needed for congestion.     gabapentin (NEURONTIN) 300 MG capsule Take 1 capsule (300 mg total) by mouth 3 (three) times daily. 270 capsule 1   No current facility-administered medications on file prior to visit.    BP 124/88 (BP Location: Left Arm, Patient Position: Sitting, Cuff  Size: Large)    Pulse 70    Temp 98.4 F (36.9 C) (Oral)  SpO2 95%       Objective:   Physical Exam Vitals and nursing note reviewed.  Constitutional:      Appearance: Normal appearance.  Cardiovascular:     Rate and Rhythm: Normal rate and regular rhythm.     Pulses: Normal pulses.     Heart sounds: Normal heart sounds.  Pulmonary:     Effort: Pulmonary effort is normal.     Breath sounds: Normal breath sounds.  Musculoskeletal:     Right lower leg: 2+ Edema present.     Left lower leg: 2+ Edema present.     Comments: She has tenderness with palpation throughout her right leg and low back.  Skin:    General: Skin is warm and dry.  Neurological:     General: No focal deficit present.     Mental Status: She is alert and oriented to person, place, and time.  Psychiatric:        Mood and Affect: Mood normal.        Behavior: Behavior normal.        Thought Content: Thought content normal.        Judgment: Judgment normal.      Assessment & Plan:  1. Bilateral lower extremity edema - Likely more arthritic pain but will order CT abdomen to r/o mass causing pain and edema.  - Advised to follow up with physical medicine and rehab for pain control  - Basic Metabolic Panel; Future - CT ABDOMEN PELVIS W WO CONTRAST; Future - Basic Metabolic Panel  2. Right hip pain  - DG Hip Unilat W OR W/O Pelvis 2-3 Views Right; Future  [name], NP

## 2021-10-24 ENCOUNTER — Other Ambulatory Visit: Payer: Self-pay | Admitting: Adult Health

## 2021-10-24 ENCOUNTER — Telehealth: Payer: Self-pay | Admitting: Adult Health

## 2021-10-24 DIAGNOSIS — F339 Major depressive disorder, recurrent, unspecified: Secondary | ICD-10-CM

## 2021-10-24 DIAGNOSIS — G6289 Other specified polyneuropathies: Secondary | ICD-10-CM

## 2021-10-24 NOTE — Telephone Encounter (Signed)
Julie Rice with [facility] stated that patient called her and said that she's still experiencing a lot of pain. Patient is wanting to know if she could get the results of the xray and labs from her visit on 12/20.  Patient is also asking if Julie Rice could go ahead and tell her what she do next with her plan of care.  Patient could be contacted at [phone].  Please advise.

## 2021-10-25 ENCOUNTER — Other Ambulatory Visit: Payer: Self-pay | Admitting: Adult Health

## 2021-10-25 ENCOUNTER — Telehealth: Payer: Self-pay | Admitting: Adult Health

## 2021-10-25 DIAGNOSIS — J9611 Chronic respiratory failure with hypoxia: Secondary | ICD-10-CM | POA: Diagnosis not present

## 2021-10-25 DIAGNOSIS — J449 Chronic obstructive pulmonary disease, unspecified: Secondary | ICD-10-CM | POA: Diagnosis not present

## 2021-10-25 DIAGNOSIS — R1031 Right lower quadrant pain: Secondary | ICD-10-CM

## 2021-10-25 DIAGNOSIS — M545 Low back pain, unspecified: Secondary | ICD-10-CM

## 2021-10-25 NOTE — Telephone Encounter (Signed)
No comment on lab results. Please advise

## 2021-10-25 NOTE — Telephone Encounter (Signed)
Julie Rice with [facility] is calling to see if provider would change ct abd/pelvis to with contrast only due to dx

## 2021-10-25 NOTE — Telephone Encounter (Signed)
Called and informed pt of lab and xray results

## 2021-10-25 NOTE — Progress Notes (Signed)
Referral to neurosurgery for possible loosening of hardware in lower back

## 2021-10-25 NOTE — Telephone Encounter (Signed)
Pt is calling back and would like xray and lab results

## 2021-10-30 ENCOUNTER — Other Ambulatory Visit: Payer: Self-pay

## 2021-10-30 ENCOUNTER — Telehealth: Payer: Self-pay | Admitting: Adult Health

## 2021-10-30 ENCOUNTER — Telehealth (INDEPENDENT_AMBULATORY_CARE_PROVIDER_SITE_OTHER): Payer: Medicare Other | Admitting: Family Medicine

## 2021-10-30 ENCOUNTER — Encounter: Payer: Self-pay | Admitting: Family Medicine

## 2021-10-30 VITALS — BP 105/77 | Temp 99.9°F

## 2021-10-30 DIAGNOSIS — U071 COVID-19: Secondary | ICD-10-CM | POA: Diagnosis not present

## 2021-10-30 MED ORDER — BENZONATATE 100 MG PO CAPS: 100.0000 mg | ORAL_CAPSULE | Freq: Three times a day (TID) | ORAL | 0 refills | Status: DC | PRN

## 2021-10-30 MED ORDER — MOLNUPIRAVIR EUA 200MG CAPSULE: 4.0000 | ORAL_CAPSULE | Freq: Two times a day (BID) | ORAL | 0 refills | Status: AC

## 2021-10-30 NOTE — Progress Notes (Signed)
Virtual Visit via Telephone Note  I connected with Julie Rice on 10/30/21 at  5:40 PM EST by telephone and verified that I am speaking with the correct person using two identifiers.   I discussed the limitations, risks, security and privacy concerns of performing an evaluation and management service by telephone and the availability of in person appointments. I also discussed with the patient that there may be a patient responsible charge related to this service. The patient expressed understanding and agreed to proceed.  Location patient: home, [employer] Location provider: work or home office Participants present for the call: patient, provider Patient did not have a visit with me in the prior 7 days to address this/these issue(s).   History of Present Illness:  Acute telemedicine visit for Covid19: -Onset: about 3 days ago; tested positive for covid yesterday -Symptoms include:nasal congestion, scratchy throat, cough, some SOB, feels tired, temp around 99, O2 93 earlier today (reports her O2 drops this low at times anyway - uses O2 - now O2 96%) -Denies:CP, SOB, HA, inability to eat/drink/get out of bed -Pertinent past medical history:see below -Pertinent medication allergies:  Allergies  Allergen Reactions   Tape Other (See Comments)    Band aides, adhesive tape Redness and pulls skin off   Tramadol Other (See Comments)    Pt has prolonged Qtc interval of 540- cannot give tramadol per pharmacy   Morphine Itching and Rash    Flushing  -COVID-19 vaccine status: Immunization History  Administered Date(s) Administered   Fluad Quad(high Dose 65+) 07/24/2019, 10/15/2020   Influenza Whole 09/04/2007, 08/16/2008   Influenza, High Dose Seasonal PF 08/04/2018   Influenza,inj,Quad PF,6+ Mos 08/17/2013, 09/02/2014   Pneumococcal Conjugate-13 09/02/2014   Pneumococcal Polysaccharide-23 06/01/2013   Td 04/08/2006   Zoster, Live 04/08/2006  GFR 73  Past Medical History:  Diagnosis Date    A-fib ([place])    Anemia    Anxiety    Bipolar disorder ([place])    Blood transfusion 1991   autologous pts own blood given    CAD (coronary artery disease)    Cataract    Chest pain    "@ rest, lying down, w/exertion"   CHF (congestive heart failure) ([place]) 07/2020   Chronic back pain    "mostly lower back but I do have upper back pain regularly" (04/06/2018)   COPD (chronic obstructive pulmonary disease) (HCC)    Depression    Esophageal stricture    Fibromyalgia    GERD (gastroesophageal reflux disease)    Heart murmur    "slight" (04/06/2018)   Hiatal hernia    Hyperlipidemia    Patient denies   Hypertension    Internal hemorrhoids    Lumbago    Mitral regurgitation    Osteoarthritis    Osteoporosis    Pneumonia ~ 01/2018   PONV (postoperative nausea and vomiting)    severe ponv, "in the past" (04/06/2018)   Rectal bleeding    Rheumatoid arthritis ([place])    Spondylosis    TIA (transient ischemic attack) 2013   Urinary incontinence    wears depends    Current Outpatient Medications on File Prior to Visit  Medication Sig Dispense Refill   acetaminophen (TYLENOL) 500 MG tablet Take 1,000 mg by mouth every 6 (six) hours as needed for moderate pain, headache or fever.     albuterol (VENTOLIN HFA) 108 (90 Base) MCG/ACT inhaler TAKE 2 PUFFS BY MOUTH EVERY 6 HOURS AS NEEDED FOR WHEEZE OR SHORTNESS OF BREATH 8 g 2  buPROPion (WELLBUTRIN XL) 150 MG 24 hr tablet Take 1 tablet (150 mg total) by mouth daily. 90 tablet 0   Carboxymethylcellulose Sodium (REFRESH TEARS OP) Place 1 drop into both eyes 2 (two) times daily.     cetirizine (ZYRTEC) 10 MG tablet Take 10 mg by mouth daily as needed for allergies.     clonazePAM (KLONOPIN) 0.5 MG tablet TAKE 1 TABLET BY MOUTH TWICE A DAY AS NEEDED FOR ANXIETY 60 tablet 2   DULoxetine (CYMBALTA) 60 MG capsule TAKE 1 CAPSULE BY MOUTH EVERY DAY 90 capsule 3   ELIQUIS 5 MG TABS tablet TAKE 1 TABLET BY MOUTH TWICE A DAY 180 tablet 1   furosemide  (LASIX) 40 MG tablet TAKE 1 TABLET (40 MG TOTAL) BY MOUTH DAILY AS NEEDED FOR FLUID OR EDEMA. 90 tablet 3   gabapentin (NEURONTIN) 300 MG capsule TAKE 1 CAPSULE BY MOUTH THREE TIMES A DAY 270 capsule 1   metoprolol tartrate (LOPRESSOR) 50 MG tablet Take 1 tablet (50 mg total) by mouth 2 (two) times daily. 60 tablet 11   Multiple Vitamins-Minerals (ONE-A-DAY WOMENS 50+) TABS Take 1 tablet by mouth daily.     omeprazole (PRILOSEC) 20 MG capsule TAKE 1 CAPSULE BY MOUTH EVERY DAY 90 capsule 3   sodium chloride (OCEAN) 0.65 % SOLN nasal spray Place 1 spray into both nostrils as needed for congestion.     No current facility-administered medications on file prior to visit.     Observations/Objective: Patient sounds cheerful and well on the phone. I do not appreciate any SOB. Speech and thought processing are grossly intact. Patient reported vitals: O296%, HR 71, BP 105/77  Assessment and Plan:  COVID-19   Discussed treatment options and risk of drug interactions, ideal treatment window, potential complications, isolation and precautions for COVID-19.  Discussed possibility of rebound with antivirals and the need to reisolate if it should occur for 5 days. After lengthy discussion, the patient opted for treatment with Legevrio due to being higher risk for complications of covid or severe disease and other factors. Discussed EUA status of this drug and the fact that there is preliminary limited knowledge of risks/interactions/side effects per EUA document vs possible benefits and precautions. This information was shared with patient during the visit and also was provided in patient instructions. The patient did want a prescription for cough, Tessalon Rx sent.  Other symptomatic care measures summarized in patient instructions. Advised to seek prompt in person care if worsening, new symptoms arise, O2 drops or if is not improving with treatment. Advised of options for inperson care in case PCP office  not available. Did let the patient know that I only do telemedicine shifts for [employer] on Tuesdays and Thursdays and advised a follow up visit with PCP or at an [facility] if has further questions or concerns.   Follow Up Instructions:  I did not refer this patient for an OV with me in the next 24 hours for this/these issue(s).  I discussed the assessment and treatment plan with the patient. The patient was provided an opportunity to ask questions and all were answered. The patient agreed with the plan and demonstrated an understanding of the instructions.   I spent 19 minutes on the date of this visit in the care of this patient. See summary of tasks completed to properly care for this patient in the detailed notes above which also included counseling of above, review of PMH, medications, allergies, evaluation of the patient and ordering and/or  instructing  patient on testing and care options.     Lucretia Kern, DO

## 2021-10-30 NOTE — Patient Instructions (Addendum)
°HOME CARE TIPS: ° ° °-I sent the medication(s) we discussed to your pharmacy: °Meds ordered this encounter  °Medications  ° molnupiravir EUA (LAGEVRIO) 200 mg CAPS capsule  °  Sig: Take 4 capsules (800 mg total) by mouth 2 (two) times daily for 5 days.  °  Dispense:  40 capsule  °  Refill:  0  ° benzonatate (TESSALON PERLES) 100 MG capsule  °  Sig: Take 1 capsule (100 mg total) by mouth 3 (three) times daily as needed.  °  Dispense:  20 capsule  °  Refill:  0  °  ° °-I sent in the Covid19 treatment or referral you requested per our discussion. Please see the information provided below and discuss further with the pharmacist/treatment team.  ° ° °-there is a chance of rebound illness after finishing your treatment. If you become sick again please isolate for an additional 5 days, plus 5 more days of masking.  ° °-can use nasal saline a few times per day if you have nasal congestion ° °-stay hydrated, drink plenty of fluids and eat small healthy meals - avoid dairy ° °-follow up with your doctor in 2-3 days unless improving and feeling better ° °-stay home while sick, except to seek medical care. If you have COVID19, you will likely be contagious for 7-10 days. Flu or Influenza is likely contagious for about 7 days. Other respiratory viral infections remain contagious for 5-10+ days depending on the virus and many other factors. Wear a good mask that fits snugly (such as N95 or KN95) if around others to reduce the risk of transmission. ° °It was nice to meet you today, and I really hope you are feeling better soon. I help [facility] out with telemedicine visits on Tuesdays and Thursdays and am happy to help if you need a follow up virtual visit on those days. Otherwise, if you have any concerns or questions following this visit please schedule a follow up visit with your Primary Care doctor or seek care at a local urgent care clinic to avoid delays in care.  ° ° °Seek in person care or schedule a follow up video  visit promptly if your symptoms worsen, new concerns arise or you are not improving with treatment. Call 911 and/or seek emergency care if your symptoms are severe or life threatening. ° °  °Fact Sheet for Patients And Caregivers °Emergency Use Authorization (EUA) Of LAGEVRIO™ (molnupiravir) capsules For °Coronavirus Disease 2019 (COVID-19) ° °What is the most important information I should know about LAGEVRIO? °LAGEVRIO may cause serious side effects, including: °? LAGEVRIO may cause harm to your unborn baby. It is not known if LAGEVRIO will °harm your baby if you take LAGEVRIO during pregnancy. °o LAGEVRIO is not recommended for use in pregnancy. °o LAGEVRIO has not been studied in pregnancy. LAGEVRIO was studied in pregnant °animals only. When LAGEVRIO was given to pregnant animals, LAGEVRIO caused °harm to their unborn babies. °o You and your healthcare provider may decide that you should take LAGEVRIO during °pregnancy if there are no other COVID-19 treatment options approved or authorized by °the FDA that are accessible or clinically appropriate for you. °o If you and your healthcare provider decide that you should take LAGEVRIO during °pregnancy, you and your healthcare provider should discuss the known and potential °benefits and the potential risks of taking LAGEVRIO during pregnancy. °For individuals who are able to become pregnant: °? You should use a reliable method of birth control (contraception) consistently and correctly °  during treatment with LAGEVRIO and for 4 days after the last dose of LAGEVRIO. Talk to °your healthcare provider about reliable birth control methods. °? Before starting treatment with LAGEVRIO your healthcare provider may do a pregnancy test °to see if you are pregnant before starting treatment with LAGEVRIO. °? Tell your healthcare provider right away if you become pregnant or think you may be °pregnant during treatment with LAGEVRIO. °Pregnancy Surveillance Program: °? There is a  pregnancy surveillance program for individuals who take LAGEVRIO during °pregnancy. The purpose of this program is to collect information about the health of you and °your baby. Talk to your healthcare provider about how to take part in this program. °? If you take LAGEVRIO during pregnancy and you agree to participate in the pregnancy °surveillance program and allow your healthcare provider to share your information with °[employer], then your healthcare provider will report your use of LAGEVRIO °during pregnancy to [employer] by calling [phone] or °[email or web address]. °For individuals who are sexually active with partners who are able to become pregnant: °? It is not known if LAGEVRIO can affect sperm. While the risk is regarded as low, animal °studies to fully assess the potential for LAGEVRIO to affect the babies of males treated with °LAGEVRIO have not been completed. A reliable method of birth control (contraception) °should be used consistently and correctly during treatment with LAGEVRIO and for at least °3 months after the last dose. The risk to sperm beyond 3 months is not known. Studies to °understand the risk to sperm beyond 3 months are ongoing. Talk to your healthcare provider °about reliable birth control methods. Talk to your healthcare provider if you have questions °or concerns about how LAGEVRIO may affect sperm. °You are being given this fact sheet because your healthcare provider believes it is necessary to °provide you with LAGEVRIO for the treatment of adults with mild-to-moderate coronavirus °disease 2019 (COVID-19) with positive results of direct SARS-CoV-2 viral testing, and °who are at high risk for progression to severe COVID-19 including hospitalization or death, and °for whom other COVID-19 treatment options approved or authorized by the FDA are not °accessible or clinically appropriate. °The U.S. Food and Drug Administration (FDA) has issued an  Emergency Use Authorization °(EUA) to make LAGEVRIO available during the COVID-19 pandemic (for more details about an °EUA please see “What is an Emergency Use Authorization?” at the end of this document). °LAGEVRIO is not an FDA-approved medicine in the [place]. Read this Fact Sheet for °information about LAGEVRIO. Talk to your healthcare provider about your options if you have °any questions. It is your choice to take LAGEVRIO. ° °What is COVID-19? °COVID-19 is caused by a virus called a coronavirus. You can get COVID-19 through close °contact with another person who has the virus. °COVID-19 illnesses have ranged from very mild-to-severe, including illness resulting in death. °While information so far suggests that most COVID-19 illness is mild, serious illness can °happen and may cause some of your other medical conditions to become worse. Older people °and people of all ages with severe, long lasting (chronic) medical conditions like heart disease, °lung disease and diabetes, for example seem to be at higher risk of being hospitalized for °COVID-19. ° °What is LAGEVRIO? °LAGEVRIO is an investigational medicine used to treat mild-to-moderate COVID-19 in adults: °? with positive results of direct SARS-CoV-2 viral testing, and °? who are at high risk for progression to severe COVID-19 including hospitalization or death, °and for whom other   other COVID-19 treatment options approved or authorized by the FDA are not accessible or clinically appropriate. The FDA has authorized the emergency use of LAGEVRIO for the treatment of mild-tomoderate COVID-19 in adults under an EUA. For more information on EUA, see the What is an Emergency Use Authorization (EUA)? section at the end of this Fact Sheet. LAGEVRIO is not authorized: ? for use in people less than 66 years of age. ? for prevention of COVID-19. ? for people needing hospitalization for COVID-19. ? for use for longer than 5 consecutive days.  What should I  tell my healthcare provider before I take LAGEVRIO? Tell your healthcare provider if you: ? Have any allergies ? Are breastfeeding or plan to breastfeed ? Have any serious illnesses ? Are taking any medicines (prescription, over-the-counter, vitamins, or herbal products).  How do I take LAGEVRIO? ? Take LAGEVRIO exactly as your healthcare provider tells you to take it. ? Take 4 capsules of LAGEVRIO every 12 hours (for example, at 8 am and at 8 pm) ? Take LAGEVRIO for 5 days. It is important that you complete the full 5 days of treatment with LAGEVRIO. Do not stop taking LAGEVRIO before you complete the full 5 days of treatment, even if you feel better. ? Take LAGEVRIO with or without food. ? You should stay in isolation for as long as your healthcare provider tells you to. Talk to your healthcare provider if you are not sure about how to properly isolate while you have COVID-19. ? Swallow LAGEVRIO capsules whole. Do not open, break, or crush the capsules. If you cannot swallow capsules whole, tell your healthcare provider. ? What to do if you miss a dose: o If it has been less than 10 hours since the missed dose, take it as soon as you remember o If it has been more than 10 hours since the missed dose, skip the missed dose and take your dose at the next scheduled time. ? Do not double the dose of LAGEVRIO to make up for a missed dose.  What are the important possible side effects of LAGEVRIO? ? See, What is the most important information I should know about LAGEVRIO? ? Allergic Reactions. Allergic reactions can happen in people taking LAGEVRIO, even after only 1 dose. Stop taking LAGEVRIO and call your healthcare provider right away if you get any of the following symptoms of an allergic reaction: o hives o rapid heartbeat o trouble swallowing or breathing o swelling of the mouth, lips, or face o throat tightness o hoarseness o skin rash The most common side effects of  LAGEVRIO are: ? diarrhea ? nausea ? dizziness These are not all the possible side effects of LAGEVRIO. Not many people have taken LAGEVRIO. Serious and unexpected side effects may happen. This medicine is still being studied, so it is possible that all of the risks are not known at this time.  What other treatment choices are there?  Veklury (remdesivir) is FDA-approved as an intravenous (IV) infusion for the treatment of mildto-moderate WSFKC-12 in certain adults and children. Talk with your doctor to see if Marijean Heath is appropriate for you. Like LAGEVRIO, FDA may also allow for the emergency use of other medicines to treat people with COVID-19. Go to [email or web address] for more information. It is your choice to be treated or not to be treated with LAGEVRIO. Should you decide not to take it, it will not change your standard medical care.  What if I am breastfeeding? Breastfeeding is not recommended  treatment with LAGEVRIO and for 4 days after the °last dose of LAGEVRIO. If you are breastfeeding or plan to breastfeed, talk to your healthcare °provider about your options and specific situation before taking LAGEVRIO. ° °How do I report side effects with LAGEVRIO? °Contact your healthcare provider if you have any side effects that bother you or do not go away. °Report side effects to FDA MedWatch at www.fda.gov/medwatch or call 1-800-FDA-1088 (1- °800-332-1088). ° °How should I store LAGEVRIO? °? Store LAGEVRIO capsules at room temperature between 68°F to 77°F (20°C to 25°C). °? Keep LAGEVRIO and all medicines out of the reach of children and pets. °How can I learn more about COVID-19? °? Ask your healthcare provider. °? Visit www.cdc.gov/COVID19 °? Contact your local or state public health department. °? Call [employer] at 1-800-672-6372 (toll free in the [place] °? Visit  www.molnupiravir.com ° °What Is an Emergency Use Authorization (EUA)? °The [place] FDA has made LAGEVRIO available under an emergency access °mechanism called an Emergency Use Authorization (EUA) The EUA is supported by a °Secretary of Health and Human Service (HHS) declaration that circumstances exist to justify °emergency use of drugs and biological products during the COVID-19 pandemic. °LAGEVRIO for the treatment of mild-to-moderate COVID-19 in adults with positive results of °direct SARS-CoV-2 viral testing, who are at high risk for progression to severe COVID-19, °including hospitalization or death, and for whom alternative COVID-19 treatment options °approved or authorized by FDA are not accessible or clinically appropriate, has not undergone °the same type of review as an FDA-approved product. In issuing an EUA under the COVID-19 °public health emergency, the FDA has determined, among other things, that based on the total °amount of scientific evidence available including data from adequate and well-controlled clinical °trials, if available, it is reasonable to believe that the product may be effective for diagnosing, °treating, or preventing COVID-19, or a serious or life-threatening disease or condition caused by °COVID-19; that the known and potential benefits of the product, when used to diagnose, treat, °or prevent such disease or condition, outweigh the known and potential risks of such product; °and that there are no adequate, approved, and available alternatives. ° All of these criteria must be met to allow for the product to be used in the treatment of patients °during the COVID-19 pandemic. The EUA for LAGEVRIO is in effect for the duration of the °COVID-19 declaration justifying emergency use of LAGEVRIO, unless terminated or revoked °(after which LAGEVRIO may no longer be used under the EUA). °For patent information: www.msd.com/research/patent °Copyright © 2021-2022 [employer]  [place], [place] and its affiliates. °All rights reserved. °[id number] °Revised: March 2022 ° °

## 2021-10-30 NOTE — Telephone Encounter (Signed)
Patient calling in with respiratory symptoms: Shortness of breath, chest pain, palpitations or other red words send to Triage  Does the patient have a fever over 100, cough, congestion, sore throat, runny nose, lost of taste/smell (please list symptoms that patient has)temp 9939 and sob. Triage line is down and pt does not have a way to er or urgent care What date did symptoms start?less than 5 day (If over 5 days ago, pt may be scheduled for in person visit)  Have you tested for Covid in the last 5 days? Yes   If yes, was it positive []  OR negative [] ? If positive in the last 5 days, please schedule virtual visit now. If negative, schedule for an in person OV with the next available provider if PCP has no openings. Please also let patient know they will be tested again (follow the script below)  "you will have to arrive prior to your appt time to be Covid tested. Please park in back of office at the [facility] & call [phone] to let the staff know you have arrived. A staff member will meet you at your car to do a rapid covid test. Once the test has resulted you will be notified by phone of your results to determine if appt will remain an in person visit or be converted to a virtual/phone visit. If you arrive less than before your appt time, your visit will be automatically converted to virtual & any recommended testing will happen AFTER the visit." Pt has virtual with dr [phone] today at 540 pm  THINGS TO REMEMBER  If no availability for virtual visit in office,  please schedule another [facility] office  If no availability at another [place] office, please instruct patient that they can schedule an evisit or virtual visit through their mychart account. Visits up to 8pm  patients can be seen in office 5 days after positive COVID test

## 2021-11-03 ENCOUNTER — Encounter: Payer: Self-pay | Admitting: Adult Health

## 2021-11-06 NOTE — Telephone Encounter (Signed)
Please advise 

## 2021-11-16 ENCOUNTER — Ambulatory Visit: Payer: Medicare Other | Admitting: Adult Health

## 2021-11-16 NOTE — Progress Notes (Deleted)
Subjective:    Patient ID: Julie Rice, female    DOB: May 16, 1936, 86 y.o.   MRN: [id number]  HPI  86 year old female who  has a past medical history of A-fib ([place]), Anemia, Anxiety, Bipolar disorder ([place]), Blood transfusion (1991), CAD (coronary artery disease), Cataract, Chest pain, CHF (congestive heart failure) ([place]) (07/2020), Chronic back pain, COPD (chronic obstructive pulmonary disease) ([place]), Depression, Esophageal stricture, Fibromyalgia, GERD (gastroesophageal reflux disease), Heart murmur, Hiatal hernia, Hyperlipidemia, Hypertension, Internal hemorrhoids, Lumbago, Mitral regurgitation, Osteoarthritis, Osteoporosis, Pneumonia (~ 01/2018), PONV (postoperative nausea and vomiting), Rectal bleeding, Rheumatoid arthritis ([place]), Spondylosis, TIA (transient ischemic attack) (2013), and Urinary incontinence.  She presents to the office today for 1 month follow-up regarding depression.  When she was last seen, prior to Christmas she reported that her depression was usually worse around the holidays due to losing loved ones, but felt due to her chronic pain and not being able to leave the house much due to ambulation issues she was feeling more depressed.  Due to her chronic medical condition she was worried that she may not be alive next Christmas.  She did [place] passive SI but stated "I would never do anything, and to check in".  Her PHQ-9 score was 22.  She was started on Wellbutrin 150 mg extended release.   Review of Systems See HPI   Past Medical History:  Diagnosis Date   A-fib ([place])    Anemia    Anxiety    Bipolar disorder ([place])    Blood transfusion 1991   autologous pts own blood given    CAD (coronary artery disease)    Cataract    Chest pain    "@ rest, lying down, w/exertion"   CHF (congestive heart failure) ([place]) 07/2020   Chronic back pain    "mostly lower back but I do have upper back pain regularly" (04/06/2018)   COPD (chronic obstructive pulmonary disease) ([facility])     Depression    Esophageal stricture    Fibromyalgia    GERD (gastroesophageal reflux disease)    Heart murmur    "slight" (04/06/2018)   Hiatal hernia    Hyperlipidemia    Patient denies   Hypertension    Internal hemorrhoids    Lumbago    Mitral regurgitation    Osteoarthritis    Osteoporosis    Pneumonia ~ 01/2018   PONV (postoperative nausea and vomiting)    severe ponv, "in the past" (04/06/2018)   Rectal bleeding    Rheumatoid arthritis ([place])    Spondylosis    TIA (transient ischemic attack) 2013   Urinary incontinence    wears depends    Social History   Socioeconomic History   Marital status: Widowed    Spouse name: Not on file   Number of children: 2   Years of education: Not on file   Highest education level: 12th grade  Occupational History   Occupation: retired    Fish farm manager: RETIRED  Tobacco Use   Smoking status: Never   Smokeless tobacco: Never  Vaping Use   Vaping Use: Never used  Substance and Sexual Activity   Alcohol use: Never   Drug use: Never   Sexual activity: Not Currently    Comment: 1st intercourse 63 yo-1 partner  Other Topics Concern   Not on file  Social History Narrative   Retired    Widowed   Social Determinants of Radio broadcast assistant Strain: Low Risk    Difficulty of Paying  Living Expenses: Not hard at all  Food Insecurity: No Food Insecurity   Worried About [place] in the Last Year: Never true   Ran Out of Food in the Last Year: Never true  Transportation Needs: No Transportation Needs   Lack of Transportation (Medical): No   Lack of Transportation (Non-Medical): No  Physical Activity: Inactive   Days of Exercise per Week: 0 days   Minutes of Exercise per Session: 0 min  Stress: Stress Concern Present   Feeling of Stress : Very much  Social Connections: Socially Isolated   Frequency of Communication with Friends and Family: Once a week   Frequency of Social Gatherings with Friends and Family: Never    Attends Religious Services: More than 4 times per year   Active Member of [employer] or Organizations: No   Attends Archivist Meetings: Never   Marital Status: Widowed  Human resources officer Violence: Not At Risk   Fear of Current or Ex-Partner: No   Emotionally Abused: No   Physically Abused: No   Sexually Abused: No    Past Surgical History:  Procedure Laterality Date   ABDOMINAL HYSTERECTOMY  1982   BACK SURGERY     BALLOON DILATION N/A 09/21/2018   Procedure: BALLOON DILATION;  Surgeon: Irene Shipper, MD;  Location: [facility];  Service: Endoscopy;  Laterality: N/A;   BALLOON DILATION N/A 08/12/2019   Procedure: BALLOON DILATION;  Surgeon: Jackquline Denmark, MD;  Location: [facility] ENDOSCOPY;  Service: Endoscopy;  Laterality: N/A;   BIOPSY  08/12/2019   Procedure: BIOPSY;  Surgeon: Jackquline Denmark, MD;  Location: [place];  Service: Endoscopy;;   BLADDER SUSPENSION  1980's   BOTOX INJECTION N/A 09/21/2018   Procedure: BOTOX INJECTION;  Surgeon: Irene Shipper, MD;  Location: [facility];  Service: Endoscopy;  Laterality: N/A;   BOTOX INJECTION N/A 08/12/2019   Procedure: BOTOX INJECTION;  Surgeon: Jackquline Denmark, MD;  Location: [facility] ENDOSCOPY;  Service: Endoscopy;  Laterality: N/A;   CATARACT EXTRACTION W/ INTRAOCULAR LENS  IMPLANT, BILATERAL Bilateral 2010   DILATION AND CURETTAGE OF UTERUS  1961   ESOPHAGEAL MANOMETRY N/A 03/25/2018   Procedure: ESOPHAGEAL MANOMETRY (EM);  Surgeon: Mauri Pole, MD;  Location: [facility];  Service: Endoscopy;  Laterality: N/A;   ESOPHAGOGASTRODUODENOSCOPY (EGD) WITH PROPOFOL N/A 04/07/2018   Procedure: ESOPHAGOGASTRODUODENOSCOPY (EGD) WITH PROPOFOL;  Surgeon: Doran Stabler, MD;  Location: [place];  Service: Gastroenterology;  Laterality: N/A;   ESOPHAGOGASTRODUODENOSCOPY (EGD) WITH PROPOFOL N/A 09/21/2018   Procedure: ESOPHAGOGASTRODUODENOSCOPY (EGD) WITH PROPOFOL;  Surgeon: Irene Shipper, MD;  [facility]: [facility];  Service: Endoscopy;   Laterality: N/A;   ESOPHAGOGASTRODUODENOSCOPY (EGD) WITH PROPOFOL N/A 08/12/2019   Procedure: ESOPHAGOGASTRODUODENOSCOPY (EGD) WITH PROPOFOL;  Surgeon: Jackquline Denmark, MD;  Location: [facility] ENDOSCOPY;  Service: Endoscopy;  Laterality: N/A;   HERNIA REPAIR     HIATAL HERNIA REPAIR N/A 08/02/2016   Procedure: LAPAROSCOPIC REPAIR OF LARGE  HIATAL HERNIA;  Surgeon: Excell Seltzer, MD;  Location: [facility];  Service: General;  Laterality: N/A;   JOINT REPLACEMENT     LAPAROSCOPIC NISSEN FUNDOPLICATION N/A [id number]   Procedure: LAPAROSCOPIC NISSEN FUNDOPLICATION;  Surgeon: Excell Seltzer, MD;  Location: [facility];  Service: General;  Laterality: N/A;   [place]; [id number]   "I've got 2 stainless steel rods; 6 screws; 2 ray cages"took bone from right hip to put in back   RIGHT/LEFT HEART CATH AND CORONARY ANGIOGRAPHY N/A 07/26/2019   Procedure: RIGHT/LEFT HEART CATH  AND CORONARY ANGIOGRAPHY;  Surgeon: Belva Crome, MD;  Location: [facility];  Service: Cardiovascular;  Laterality: N/A;   SVT ABLATION N/A 04/25/2021   Procedure: SVT ABLATION;  Surgeon: Constance Haw, MD;  Location: [place];  Service: Cardiovascular;  Laterality: N/A;   TEE WITHOUT CARDIOVERSION N/A 08/19/2019   Procedure: TRANSESOPHAGEAL ECHOCARDIOGRAM (TEE);  Surgeon: Lelon Perla, MD;  Location: [facility] ENDOSCOPY;  Service: Cardiovascular;  Laterality: N/A;   TONSILLECTOMY AND ADENOIDECTOMY  1945   TOTAL KNEE ARTHROPLASTY Right ~ [place]  ~ 1976    Family History  Problem Relation Age of Onset   Kidney disease Mother    Hypertension Mother    Heart disease Father 74       die of MI at age 61   Suicidality Son    Heart disease Brother    Heart attack Brother    Colon cancer Neg Hx    Esophageal cancer Neg Hx    Pancreatic cancer Neg Hx    Rectal cancer Neg Hx    Stomach cancer Neg Hx     Allergies  Allergen Reactions   Tape Other (See Comments)    Band aides,  adhesive tape Redness and pulls skin off   Tramadol Other (See Comments)    Pt has prolonged Qtc interval of 540- cannot give tramadol per pharmacy   Morphine Itching and Rash    Flushing    Current Outpatient Medications on File Prior to Visit  Medication Sig Dispense Refill   acetaminophen (TYLENOL) 500 MG tablet Take 1,000 mg by mouth every 6 (six) hours as needed for moderate pain, headache or fever.     albuterol (VENTOLIN HFA) 108 (90 Base) MCG/ACT inhaler TAKE 2 PUFFS BY MOUTH EVERY 6 HOURS AS NEEDED FOR WHEEZE OR SHORTNESS OF BREATH 8 g 2   benzonatate (TESSALON PERLES) 100 MG capsule Take 1 capsule (100 mg total) by mouth 3 (three) times daily as needed. 20 capsule 0   buPROPion (WELLBUTRIN XL) 150 MG 24 hr tablet Take 1 tablet (150 mg total) by mouth daily. 90 tablet 0   Carboxymethylcellulose Sodium (REFRESH TEARS OP) Place 1 drop into both eyes 2 (two) times daily.     cetirizine (ZYRTEC) 10 MG tablet Take 10 mg by mouth daily as needed for allergies.     clonazePAM (KLONOPIN) 0.5 MG tablet TAKE 1 TABLET BY MOUTH TWICE A DAY AS NEEDED FOR ANXIETY 60 tablet 2   DULoxetine (CYMBALTA) 60 MG capsule TAKE 1 CAPSULE BY MOUTH EVERY DAY 90 capsule 3   ELIQUIS 5 MG TABS tablet TAKE 1 TABLET BY MOUTH TWICE A DAY 180 tablet 1   furosemide (LASIX) 40 MG tablet TAKE 1 TABLET (40 MG TOTAL) BY MOUTH DAILY AS NEEDED FOR FLUID OR EDEMA. 90 tablet 3   gabapentin (NEURONTIN) 300 MG capsule TAKE 1 CAPSULE BY MOUTH THREE TIMES A DAY 270 capsule 1   metoprolol tartrate (LOPRESSOR) 50 MG tablet Take 1 tablet (50 mg total) by mouth 2 (two) times daily. 60 tablet 11   Multiple Vitamins-Minerals (ONE-A-DAY WOMENS 50+) TABS Take 1 tablet by mouth daily.     omeprazole (PRILOSEC) 20 MG capsule TAKE 1 CAPSULE BY MOUTH EVERY DAY 90 capsule 3   sodium chloride (OCEAN) 0.65 % SOLN nasal spray Place 1 spray into both nostrils as needed for congestion.     No current facility-administered medications on file  prior to visit.  There were no vitals taken for this visit.      Objective:   Physical Exam Vitals and nursing note reviewed.  Constitutional:      Appearance: Normal appearance.  Cardiovascular:     Rate and Rhythm: Normal rate and regular rhythm.     Pulses: Normal pulses.     Heart sounds: Normal heart sounds.  Pulmonary:     Effort: Pulmonary effort is normal.     Breath sounds: Normal breath sounds.  Neurological:     General: No focal deficit present.     Mental Status: She is alert and oriented to person, place, and time.  Psychiatric:        Mood and Affect: Mood normal.        Behavior: Behavior normal.        Thought Content: Thought content normal.        Judgment: Judgment normal.          Assessment & Plan:

## 2021-11-22 ENCOUNTER — Ambulatory Visit
Admission: RE | Admit: 2021-11-22 | Discharge: 2021-11-22 | Disposition: A | Discharge status: Home / Self Care | Payer: Medicare Other | Source: Ambulatory Visit | Attending: Adult Health | Admitting: Adult Health

## 2021-11-22 DIAGNOSIS — R1031 Right lower quadrant pain: Secondary | ICD-10-CM

## 2021-11-22 DIAGNOSIS — K449 Diaphragmatic hernia without obstruction or gangrene: Secondary | ICD-10-CM | POA: Diagnosis not present

## 2021-11-22 DIAGNOSIS — M4186 Other forms of scoliosis, lumbar region: Secondary | ICD-10-CM | POA: Diagnosis not present

## 2021-11-22 DIAGNOSIS — M47816 Spondylosis without myelopathy or radiculopathy, lumbar region: Secondary | ICD-10-CM | POA: Diagnosis not present

## 2021-11-22 MED ORDER — IOPAMIDOL (ISOVUE-300) INJECTION 61%
100.0000 mL | Freq: Once | INTRAVENOUS | Status: AC | PRN
  Administered 2021-11-22: 100 mL via INTRAVENOUS

## 2021-11-25 DIAGNOSIS — J449 Chronic obstructive pulmonary disease, unspecified: Secondary | ICD-10-CM | POA: Diagnosis not present

## 2021-11-25 DIAGNOSIS — J9611 Chronic respiratory failure with hypoxia: Secondary | ICD-10-CM | POA: Diagnosis not present

## 2021-11-30 ENCOUNTER — Other Ambulatory Visit: Payer: Self-pay | Admitting: Physical Medicine and Rehabilitation

## 2021-11-30 ENCOUNTER — Other Ambulatory Visit: Payer: Self-pay | Admitting: Adult Health

## 2021-11-30 DIAGNOSIS — F339 Major depressive disorder, recurrent, unspecified: Secondary | ICD-10-CM

## 2021-12-25 ENCOUNTER — Other Ambulatory Visit: Payer: Self-pay | Admitting: Adult Health

## 2021-12-25 ENCOUNTER — Ambulatory Visit: Payer: Medicare Other

## 2021-12-25 DIAGNOSIS — R0600 Dyspnea, unspecified: Secondary | ICD-10-CM

## 2021-12-26 DIAGNOSIS — J449 Chronic obstructive pulmonary disease, unspecified: Secondary | ICD-10-CM | POA: Diagnosis not present

## 2021-12-28 ENCOUNTER — Telehealth: Payer: Self-pay | Admitting: Adult Health

## 2021-12-28 NOTE — Telephone Encounter (Signed)
Tried calling patient to schedule Medicare Annual Wellness Visit (AWV) either virtually or in office.   No answer  Last AWV ;12/19/20  please schedule at anytime with [facility] Nurse Health Advisor 1 or 2   This should be a 45 minute visit.

## 2021-12-30 ENCOUNTER — Other Ambulatory Visit: Payer: Self-pay | Admitting: Adult Health

## 2021-12-30 DIAGNOSIS — Z760 Encounter for issue of repeat prescription: Secondary | ICD-10-CM

## 2022-01-02 NOTE — Telephone Encounter (Signed)
Okay for refill?  ? ? ?LOV ? ? ?Last Refill       10/23/2021 ? ?clonazePAM (KLONOPIN) 0.5 MG tablet 60 tablet 2 10/02/2021   ?Sig:   TAKE 1 TABLET BY MOUTH TWICE A DAY AS NEEDED FOR      ? ?

## 2022-01-23 DIAGNOSIS — J449 Chronic obstructive pulmonary disease, unspecified: Secondary | ICD-10-CM | POA: Diagnosis not present

## 2022-01-23 DIAGNOSIS — J9611 Chronic respiratory failure with hypoxia: Secondary | ICD-10-CM | POA: Diagnosis not present

## 2022-02-03 ENCOUNTER — Other Ambulatory Visit: Payer: Self-pay | Admitting: Physician Assistant

## 2022-02-03 ENCOUNTER — Other Ambulatory Visit: Payer: Self-pay | Admitting: Adult Health

## 2022-02-03 DIAGNOSIS — F339 Major depressive disorder, recurrent, unspecified: Secondary | ICD-10-CM

## 2022-02-06 ENCOUNTER — Telehealth: Payer: Self-pay | Admitting: Adult Health

## 2022-02-06 NOTE — Telephone Encounter (Signed)
Left message for patient to call back and schedule Medicare Annual Wellness Visit (AWV) either virtually or in office. Left  my Zachery Conch number [phone] ? ? ?Last AWV ;12/19/20 ?please schedule at anytime with [facility] Nurse Health Advisor 1 or 2 ? ? ?

## 2022-02-14 ENCOUNTER — Ambulatory Visit (INDEPENDENT_AMBULATORY_CARE_PROVIDER_SITE_OTHER): Payer: Medicare Other

## 2022-02-14 VITALS — Ht 61.0 in | Wt 158.0 lb

## 2022-02-14 DIAGNOSIS — Z0000 Encounter for general adult medical examination without abnormal findings: Secondary | ICD-10-CM | POA: Diagnosis not present

## 2022-02-14 NOTE — Patient Instructions (Addendum)
?Julie Rice , ?Thank you for taking time to come for your Medicare Wellness Visit. I appreciate your ongoing commitment to your health goals. Please review the following plan we discussed and let me know if I can assist you in the future.  ? ?These are the goals we discussed: ? Goals   ? ?   Patient Stated (pt-stated)   ?   Stay healthy. ?  ? ?  ?  ?This is a list of the screening recommended for you and due dates:  ?Health Maintenance  ?Topic Date Due  ? COVID-19 Vaccine (1) 03/02/2022*  ? Zoster (Shingles) Vaccine (1 of 2) 05/16/2022*  ? Tetanus Vaccine  02/15/2023*  ? Flu Shot  06/04/2022  ? Pneumonia Vaccine  Completed  ? DEXA scan (bone density measurement)  Completed  ? HPV Vaccine  Aged Out  ?*Topic was postponed. The date shown is not the original due date.  ? ? Advanced directives: Yes ? ?Conditions/risks identified: None ? ?Next appointment: Follow up in one year for your annual wellness visit  ? ? ?Preventive Care 9 Years and Older, Female ?Preventive care refers to lifestyle choices and visits with your health care provider that can promote health and wellness. ?What does preventive care include? ?A yearly physical exam. This is also called an annual well check. ?Dental exams once or twice a year. ?Routine eye exams. Ask your health care provider how often you should have your eyes checked. ?Personal lifestyle choices, including: ?Daily care of your teeth and gums. ?Regular physical activity. ?Eating a healthy diet. ?Avoiding tobacco and drug use. ?Limiting alcohol use. ?Practicing safe sex. ?Taking low-dose aspirin every day. ?Taking vitamin and mineral supplements as recommended by your health care provider. ?What happens during an annual well check? ?The services and screenings done by your health care provider during your annual well check will depend on your age, overall health, lifestyle risk factors, and family history of disease. ?Counseling  ?Your health care provider may ask you questions  about your: ?Alcohol use. ?Tobacco use. ?Drug use. ?Emotional well-being. ?Home and relationship well-being. ?Sexual activity. ?Eating habits. ?History of falls. ?Memory and ability to understand (cognition). ?Work and work Statistician. ?Reproductive health. ?Screening  ?You may have the following tests or measurements: ?Height, weight, and BMI. ?Blood pressure. ?Lipid and cholesterol levels. These may be checked every 5 years, or more frequently if you are over 3 years old. ?Skin check. ?Lung cancer screening. You may have this screening every year starting at age 36 if you have a 30-pack-year history of smoking and currently smoke or have quit within the past 15 years. ?Fecal occult blood test (FOBT) of the stool. You may have this test every year starting at age 40. ?Flexible sigmoidoscopy or colonoscopy. You may have a sigmoidoscopy every 5 years or a colonoscopy every 10 years starting at age 85. ?Hepatitis C blood test. ?Hepatitis B blood test. ?Sexually transmitted disease (STD) testing. ?Diabetes screening. This is done by checking your blood sugar (glucose) after you have not eaten for a while (fasting). You may have this done every 1-3 years. ?Bone density scan. This is done to screen for osteoporosis. You may have this done starting at age 52. ?Mammogram. This may be done every 1-2 years. Talk to your health care provider about how often you should have regular mammograms. ?Talk with your health care provider about your test results, treatment options, and if necessary, the need for more tests. ?Vaccines  ?Your health care provider may recommend  certain vaccines, such as: ?Influenza vaccine. This is recommended every year. ?Tetanus, diphtheria, and acellular pertussis (Tdap, Td) vaccine. You may need a Td booster every 10 years. ?Zoster vaccine. You may need this after age 57. ?Pneumococcal 13-valent conjugate (PCV13) vaccine. One dose is recommended after age 44. ?Pneumococcal polysaccharide (PPSV23)  vaccine. One dose is recommended after age 34. ?Talk to your health care provider about which screenings and vaccines you need and how often you need them. ?This information is not intended to replace advice given to you by your health care provider. Make sure you discuss any questions you have with your health care provider. ?Document Released: 11/17/2015 Document Revised: 07/10/2016 Document Reviewed: 08/22/2015 ?Elsevier Interactive Patient Education ? 2017 [place]. ? ?Fall Prevention in the Home ?Falls can cause injuries. They can happen to people of all ages. There are many things you can do to make your home safe and to help prevent falls. ?What can I do on the outside of my home? ?Regularly fix the edges of walkways and driveways and fix any cracks. ?Remove anything that might make you trip as you walk through a door, such as a raised step or threshold. ?Trim any bushes or trees on the path to your home. ?Use bright outdoor lighting. ?Clear any walking paths of anything that might make someone trip, such as rocks or tools. ?Regularly check to see if handrails are loose or broken. Make sure that both sides of any steps have handrails. ?Any raised decks and porches should have guardrails on the edges. ?Have any leaves, snow, or ice cleared regularly. ?Use sand or salt on walking paths during winter. ?Clean up any spills in your garage right away. This includes oil or grease spills. ?What can I do in the bathroom? ?Use night lights. ?Install grab bars by the toilet and in the tub and shower. Do not use towel bars as grab bars. ?Use non-skid mats or decals in the tub or shower. ?If you need to sit down in the shower, use a plastic, non-slip stool. ?Keep the floor dry. Clean up any water that spills on the floor as soon as it happens. ?Remove soap buildup in the tub or shower regularly. ?Attach bath mats securely with double-sided non-slip rug tape. ?Do not have throw rugs and other things on the floor that  can make you trip. ?What can I do in the bedroom? ?Use night lights. ?Make sure that you have a light by your bed that is easy to reach. ?Do not use any sheets or blankets that are too big for your bed. They should not hang down onto the floor. ?Have a firm chair that has side arms. You can use this for support while you get dressed. ?Do not have throw rugs and other things on the floor that can make you trip. ?What can I do in the kitchen? ?Clean up any spills right away. ?Avoid walking on wet floors. ?Keep items that you use a lot in easy-to-reach places. ?If you need to reach something above you, use a strong step stool that has a grab bar. ?Keep electrical cords out of the way. ?Do not use floor polish or wax that makes floors slippery. If you must use wax, use non-skid floor wax. ?Do not have throw rugs and other things on the floor that can make you trip. ?What can I do with my stairs? ?Do not leave any items on the stairs. ?Make sure that there are handrails on both sides of the  stairs and use them. Fix handrails that are broken or loose. Make sure that handrails are as long as the stairways. ?Check any carpeting to make sure that it is firmly attached to the stairs. Fix any carpet that is loose or worn. ?Avoid having throw rugs at the top or bottom of the stairs. If you do have throw rugs, attach them to the floor with carpet tape. ?Make sure that you have a light switch at the top of the stairs and the bottom of the stairs. If you do not have them, ask someone to add them for you. ?What else can I do to help prevent falls? ?Wear shoes that: ?Do not have high heels. ?Have rubber bottoms. ?Are comfortable and fit you well. ?Are closed at the toe. Do not wear sandals. ?If you use a stepladder: ?Make sure that it is fully opened. Do not climb a closed stepladder. ?Make sure that both sides of the stepladder are locked into place. ?Ask someone to hold it for you, if possible. ?Clearly mark and make sure that you  can see: ?Any grab bars or handrails. ?First and last steps. ?Where the edge of each step is. ?Use tools that help you move around (mobility aids) if they are needed. These include: ?Canes. ?Walkers. ?Scoo

## 2022-02-14 NOTE — Progress Notes (Signed)
? ?Subjective:  ? Julie Rice is a 86 y.o. female who presents for Medicare Annual (Subsequent) preventive examination. ? ?Review of Systems    ?Virtual Visit via Telephone Note ? ?I connected with  [name] on 02/14/22 at 11:15 AM EDT by telephone and verified that I am speaking with the correct person using two identifiers. ? ?Location: ?Patient: Home ?Provider: Office ?Persons participating in the virtual visit: patient/Nurse Health Advisor ?  ?I discussed the limitations, risks, security and privacy concerns of performing an evaluation and management service by telephone and the availability of in person appointments. The patient expressed understanding and agreed to proceed. ? ?Interactive audio and video telecommunications were attempted between this nurse and patient, however failed, due to patient having technical difficulties OR patient did not have access to video capability.  We continued and completed visit with audio only. ? ?Some vital signs may be absent or patient reported.  ? ?Tillie RungBeverly W Milah Recht, LPN  ?Cardiac Risk Factors include: advanced age (>2855men, 62>65 women);hypertension ? ?   ?Objective:  ?  ?Today's Vitals  ? 02/14/22 1124  ?Weight: 158 lb (71.7 kg)  ?Height: 5\' 1"  (1.549 m)  ? ?Body mass index is 29.85 kg/m?. ? ? ?  02/14/2022  ? 11:31 AM 04/25/2021  ?  1:06 PM 04/03/2021  ?  8:52 PM 12/19/2020  ?  2:02 PM 10/15/2020  ?  6:26 AM 08/16/2019  ?  8:32 PM 08/08/2019  ?  6:51 AM  ?Advanced Directives  ?Does Patient Have a Medical Advance Directive? Yes Yes Yes Yes No;Yes Yes Yes  ?Type of Estate agentAdvance Directive Healthcare Power of SidneyAttorney;Living will Healthcare Power of State Street Corporationttorney Healthcare Power of Attorney Living will Living will Healthcare Power of [place]orney;Living will Healthcare Power of [place]orney;Living will  ?Does patient want to make changes to medical advance directive? No - Patient declined No - Patient declined   Yes (ED - send information to MyChart) No - Patient declined   ?Copy of  Healthcare Power of Attorney in Chart? No - copy requested No - copy requested    No - copy requested, Physician notified Yes - validated most recent copy scanned in chart (See row information)  ? ? ?Current Medications (verified) ?Outpatient Encounter Medications as of 02/14/2022  ?Medication Sig  ? acetaminophen (TYLENOL) 500 MG tablet Take 1,000 mg by mouth every 6 (six) hours as needed for moderate pain, headache or fever.  ? albuterol (VENTOLIN HFA) 108 (90 Base) MCG/ACT inhaler TAKE 2 PUFFS BY MOUTH EVERY 6 HOURS AS NEEDED FOR WHEEZE OR SHORTNESS OF BREATH  ? benzonatate (TESSALON PERLES) 100 MG capsule Take 1 capsule (100 mg total) by mouth 3 (three) times daily as needed.  ? buPROPion (WELLBUTRIN XL) 150 MG 24 hr tablet TAKE 1 TABLET BY MOUTH EVERY DAY  ? Carboxymethylcellulose Sodium (REFRESH TEARS OP) Place 1 drop into both eyes 2 (two) times daily.  ? cetirizine (ZYRTEC) 10 MG tablet Take 10 mg by mouth daily as needed for allergies.  ? clonazePAM (KLONOPIN) 0.5 MG tablet TAKE 1 TABLET BY MOUTH TWICE A DAY AS NEEDED FOR ANXIETY  ? DULoxetine (CYMBALTA) 60 MG capsule TAKE 1 CAPSULE BY MOUTH EVERY DAY  ? ELIQUIS 5 MG TABS tablet TAKE 1 TABLET BY MOUTH TWICE A DAY  ? furosemide (LASIX) 40 MG tablet TAKE 1 TABLET (40 MG TOTAL) BY MOUTH DAILY AS NEEDED FOR FLUID OR EDEMA.  ? gabapentin (NEURONTIN) 300 MG capsule TAKE 1 CAPSULE BY MOUTH THREE TIMES A  DAY  ? metoprolol tartrate (LOPRESSOR) 50 MG tablet TAKE 1 TABLET BY MOUTH TWICE A DAY  ? Multiple Vitamins-Minerals (ONE-A-DAY WOMENS 50+) TABS Take 1 tablet by mouth daily.  ? omeprazole (PRILOSEC) 20 MG capsule TAKE 1 CAPSULE BY MOUTH EVERY DAY  ? sodium chloride (OCEAN) 0.65 % SOLN nasal spray Place 1 spray into both nostrils as needed for congestion.  ? ?No facility-administered encounter medications on file as of 02/14/2022.  ? ? ?Allergies (verified) ?Tape, Tramadol, and Morphine  ? ?History: ?Past Medical History:  ?Diagnosis Date  ? A-fib (HCC)   ? Anemia    ? Anxiety   ? Bipolar disorder (HCC)   ? Blood transfusion 1991  ? autologous pts own blood given   ? CAD (coronary artery disease)   ? Cataract   ? Chest pain   ? "@ rest, lying down, w/exertion"  ? CHF (congestive heart failure) (HCC) 07/2020  ? Chronic back pain   ? "mostly lower back but I do have upper back pain regularly" (04/06/2018)  ? COPD (chronic obstructive pulmonary disease) (HCC)   ? Depression   ? Esophageal stricture   ? Fibromyalgia   ? GERD (gastroesophageal reflux disease)   ? Heart murmur   ? "slight" (04/06/2018)  ? Hiatal hernia   ? Hyperlipidemia   ? Patient denies  ? Hypertension   ? Internal hemorrhoids   ? Lumbago   ? Mitral regurgitation   ? Osteoarthritis   ? Osteoporosis   ? Pneumonia ~ 01/2018  ? PONV (postoperative nausea and vomiting)   ? severe ponv, "in the past" (04/06/2018)  ? Rectal bleeding   ? Rheumatoid arthritis (HCC)   ? Spondylosis   ? TIA (transient ischemic attack) 2013  ? Urinary incontinence   ? wears depends  ? ?Past Surgical History:  ?Procedure Laterality Date  ? ABDOMINAL HYSTERECTOMY  1982  ? BACK SURGERY    ? BALLOON DILATION N/A 09/21/2018  ? Procedure: BALLOON DILATION;  Surgeon: Hilarie Fredrickson, MD;  Location: [facility] ENDOSCOPY;  Service: Endoscopy;  Laterality: N/A;  ? BALLOON DILATION N/A 08/12/2019  ? Procedure: BALLOON DILATION;  Surgeon: Lynann Bologna, MD;  Location: [facility] ENDOSCOPY;  Service: Endoscopy;  Laterality: N/A;  ? BIOPSY  08/12/2019  ? Procedure: BIOPSY;  Surgeon: Lynann Bologna, MD;  Location: [facility] ENDOSCOPY;  Service: Endoscopy;;  ? BLADDER SUSPENSION  1980's  ? BOTOX INJECTION N/A 09/21/2018  ? Procedure: BOTOX INJECTION;  Surgeon: Hilarie Fredrickson, MD;  Location: [facility] ENDOSCOPY;  Service: Endoscopy;  Laterality: N/A;  ? BOTOX INJECTION N/A 08/12/2019  ? Procedure: BOTOX INJECTION;  Surgeon: Lynann Bologna, MD;  Location: [facility] ENDOSCOPY;  Service: Endoscopy;  Laterality: N/A;  ? CATARACT EXTRACTION W/ INTRAOCULAR LENS  IMPLANT, BILATERAL Bilateral 2010  ? DILATION AND  CURETTAGE OF UTERUS  1961  ? ESOPHAGEAL MANOMETRY N/A 03/25/2018  ? Procedure: ESOPHAGEAL MANOMETRY (EM);  Surgeon: Napoleon Form, MD;  Location: [facility] ENDOSCOPY;  Service: Endoscopy;  Laterality: N/A;  ? ESOPHAGOGASTRODUODENOSCOPY (EGD) WITH PROPOFOL N/A 04/07/2018  ? Procedure: ESOPHAGOGASTRODUODENOSCOPY (EGD) WITH PROPOFOL;  Surgeon: Sherrilyn Rist, MD;  Location: [facility] ENDOSCOPY;  Service: Gastroenterology;  Laterality: N/A;  ? ESOPHAGOGASTRODUODENOSCOPY (EGD) WITH PROPOFOL N/A 09/21/2018  ? Procedure: ESOPHAGOGASTRODUODENOSCOPY (EGD) WITH PROPOFOL;  Surgeon: Hilarie Fredrickson, MD;  Location[facility] ENDOSCOPY;  Service: Endoscopy;  Laterality: N/A;  ? ESOPHAGOGASTRODUODENOSCOPY (EGD) WITH PROPOFOL N/A 08/12/2019  ? Procedure: ESOPHAGOGASTRODUODENOSCOPY (EGD) WITH PROPOFOL;  Surgeon: Lynann Bologna, MD;  Location: [facility] ENDOSCOPY;  Service:  Endoscopy;  Laterality: N/A;  ? HERNIA REPAIR    ? HIATAL HERNIA REPAIR N/A 08/02/2016  ? Procedure: LAPAROSCOPIC REPAIR OF LARGE  HIATAL HERNIA;  Surgeon: Glenna Fellows, MD;  Location: [facility] ORS;  Service: General;  Laterality: N/A;  ? JOINT REPLACEMENT    ? LAPAROSCOPIC NISSEN FUNDOPLICATION N/A 08/02/2016  ? Procedure: LAPAROSCOPIC NISSEN FUNDOPLICATION;  Surgeon: Glenna Fellows, MD;  Location: [facility] ORS;  Service: General;  Laterality: N/A;  ? LUMBAR LAMINECTOMY  1990; 1994; 2751;7001  ? "I've got 2 stainless steel rods; 6 screws; 2 ray cages"took bone from right hip to put in back  ? RIGHT/LEFT HEART CATH AND CORONARY ANGIOGRAPHY N/A 07/26/2019  ? Procedure: RIGHT/LEFT HEART CATH AND CORONARY ANGIOGRAPHY;  Surgeon: Lyn Records, MD;  Location: [facility] INVASIVE CV LAB;  Service: Cardiovascular;  Laterality: N/A;  ? SVT ABLATION N/A 04/25/2021  ? Procedure: SVT ABLATION;  Surgeon: Regan Lemming, MD;  Location: [facility] INVASIVE CV LAB;  Service: Cardiovascular;  Laterality: N/A;  ? TEE WITHOUT CARDIOVERSION N/A 08/19/2019  ? Procedure: TRANSESOPHAGEAL ECHOCARDIOGRAM (TEE);  Surgeon:  Lewayne Bunting, MD;  Location: [facility] ENDOSCOPY;  Service: Cardiovascular;  Laterality: N/A;  ? TONSILLECTOMY AND ADENOIDECTOMY  1945  ? TOTAL KNEE ARTHROPLASTY Right ~ 1996  ? TUBAL LIGATION  ~ 1976  ? ?Family H

## 2022-02-15 ENCOUNTER — Telehealth: Payer: Self-pay

## 2022-02-15 NOTE — Telephone Encounter (Signed)
--  Pt would like to know if she can wait until Monday ?for an appt. or if it's more urgent, Symptoms have been ?present since Easter. Pt c/o shortness of breath, leg ?pain from knees down, difficulty walking, dizziness. ?Swelling for a long time, but started weeping ?November. Increased lasix then. Tops of feet are ?purple. Numbness and tingling ? ?02/14/2022 12:22:48 PM Go to ED Now (or PCP triage) Gasper Sells, RN, Marylu Lund ? ?Comments ?User: Jason Coop, RN Date/Time [name] Time): 02/14/2022 12:26:07 PM ?Referred to ER. Pt has afib, burns to take deep breath, SOB, coughing at night. covid last December, dizziness, ?discoloration to feet ? ?Referrals ?[facility] - ED ? ?02/15/22 1004: Attempted to call patient to check status. If she calls back can be sent to [facility]. ?

## 2022-02-18 ENCOUNTER — Emergency Department (HOSPITAL_COMMUNITY): Payer: Medicare Other

## 2022-02-18 ENCOUNTER — Ambulatory Visit: Payer: Medicare Other | Admitting: Family Medicine

## 2022-02-18 ENCOUNTER — Encounter (HOSPITAL_COMMUNITY): Payer: Self-pay | Admitting: Emergency Medicine

## 2022-02-18 ENCOUNTER — Other Ambulatory Visit: Payer: Self-pay

## 2022-02-18 ENCOUNTER — Emergency Department (HOSPITAL_COMMUNITY)
Admission: EM | Admit: 2022-02-18 | Discharge: 2022-02-18 | Discharge status: Against Medical Advice | Payer: Medicare Other | Attending: Emergency Medicine | Admitting: Emergency Medicine

## 2022-02-18 DIAGNOSIS — R0789 Other chest pain: Secondary | ICD-10-CM | POA: Diagnosis not present

## 2022-02-18 DIAGNOSIS — Z5321 Procedure and treatment not carried out due to patient leaving prior to being seen by health care provider: Secondary | ICD-10-CM | POA: Diagnosis not present

## 2022-02-18 DIAGNOSIS — J90 Pleural effusion, not elsewhere classified: Secondary | ICD-10-CM | POA: Diagnosis not present

## 2022-02-18 DIAGNOSIS — M7989 Other specified soft tissue disorders: Secondary | ICD-10-CM | POA: Insufficient documentation

## 2022-02-18 DIAGNOSIS — R0689 Other abnormalities of breathing: Secondary | ICD-10-CM | POA: Diagnosis not present

## 2022-02-18 DIAGNOSIS — R079 Chest pain, unspecified: Secondary | ICD-10-CM | POA: Diagnosis not present

## 2022-02-18 DIAGNOSIS — R252 Cramp and spasm: Secondary | ICD-10-CM | POA: Diagnosis not present

## 2022-02-18 DIAGNOSIS — I509 Heart failure, unspecified: Secondary | ICD-10-CM | POA: Diagnosis not present

## 2022-02-18 DIAGNOSIS — R0602 Shortness of breath: Secondary | ICD-10-CM | POA: Insufficient documentation

## 2022-02-18 LAB — COMPREHENSIVE METABOLIC PANEL
ALT: 19 U/L (ref 0–44)
AST: 21 U/L (ref 15–41)
Albumin: 3.4 g/dL — ABNORMAL LOW (ref 3.5–5.0)
Alkaline Phosphatase: 52 U/L (ref 38–126)
Anion gap: 7 (ref 5–15)
BUN: 14 mg/dL (ref 8–23)
CO2: 25 mmol/L (ref 22–32)
Calcium: 8.9 mg/dL (ref 8.9–10.3)
Chloride: 109 mmol/L (ref 98–111)
Creatinine, Ser: 0.74 mg/dL (ref 0.44–1.00)
GFR, Estimated: >60 mL/min (ref >60)
Glucose, Bld: 96 mg/dL (ref 70–99)
Potassium: 4.6 mmol/L (ref 3.5–5.1)
Sodium: 141 mmol/L (ref 135–145)
Total Bilirubin: 0.8 mg/dL (ref 0.3–1.2)
Total Protein: 6.1 g/dL — ABNORMAL LOW (ref 6.5–8.1)

## 2022-02-18 LAB — CBC
HCT: 31.2 % — ABNORMAL LOW (ref 36.0–46.0)
Hemoglobin: 9.1 g/dL — ABNORMAL LOW (ref 12.0–15.0)
MCH: 27 pg (ref 26.0–34.0)
MCHC: 29.2 g/dL — ABNORMAL LOW (ref 30.0–36.0)
MCV: 92.6 fL (ref 80.0–100.0)
Platelets: 300 10*3/uL (ref 150–400)
RBC: 3.37 MIL/uL — ABNORMAL LOW (ref 3.87–5.11)
RDW: 15.7 % — ABNORMAL HIGH (ref 11.5–15.5)
WBC: 8.8 10*3/uL (ref 4.0–10.5)
nRBC: 0 % (ref 0.0–0.2)

## 2022-02-18 LAB — BRAIN NATRIURETIC PEPTIDE: B Natriuretic Peptide: 1547.1 pg/mL — ABNORMAL HIGH (ref 0.0–100.0)

## 2022-02-18 LAB — TROPONIN I (HIGH SENSITIVITY): Troponin I (High Sensitivity): 10 ng/L (ref <18)

## 2022-02-18 NOTE — ED Provider Triage Note (Signed)
Emergency Medicine Provider Triage Evaluation Note ? ?Julie Rice , a 86 y.o. female  was evaluated in triage.  Pt complains of gradual onset, intermittent, chest pain with SOB x 1.5 weeks. Pt also complains of worsening leg swelling bilaterally. She reports compliance with her fluid pill. Hx of A fib requiring cardioversion and ablation last year.  ? ?Review of Systems  ?Positive: + chest pain, SOB, leg swelling ?Negative: - fevers, chills ? ?Physical Exam  ?BP (!) 162/95 (BP Location: Right Arm)   Pulse (!) 59   Temp 98 ?F (36.7 ?C)   Resp 19   SpO2 93%  ?Gen:   Awake, no distress   ?Resp:  Normal effort  ?MSK:   Moves extremities without difficulty  ?Other:  Bilateral 2+ pitting edema ? ?Medical Decision Making  ?Medically screening exam initiated at 12:13 PM.  Appropriate orders placed.  Stefana C Gaillard was informed that the remainder of the evaluation will be completed by another provider, this initial triage assessment does not replace that evaluation, and the importance of remaining in the ED until their evaluation is complete. ? ? ?  ?Tanda Rockers, PA-C ?02/18/22 1214 ? ?

## 2022-02-18 NOTE — ED Triage Notes (Addendum)
Pt to triage via [facility] from home.  Reports increased SOB with exertion and chest pressure x 2 months that is getting worse.  Also reports increased swelling in bilateral legs since Fridays.  Denies chest pain at present.  Reports L shoulder blade pain. ? ?BP 192/110 ? ?

## 2022-02-22 ENCOUNTER — Telehealth: Payer: Self-pay | Admitting: Adult Health

## 2022-02-22 NOTE — Telephone Encounter (Signed)
Triage Nurse called back and recommended pt to go back to the hospital but she declined due to being in the hospital for a long time a few days ago. I offered the pt to sched an appt but pt declined due to not wanting to go through the process because it makes her BP go up when she has to do a lot. So I offered to sched a VV and she stated her MyChart locked her out and she doesn't know how to work it since her husband passed. I gave her the MyChart help desk # and she stated she will give them a call to help unlock her acct.  ? ?FYI ?

## 2022-02-22 NOTE — Telephone Encounter (Signed)
Pt called asking if Dr. can review her lab results with her that she received from the Hospital on 4/17. No one discussed it with her because she couldn't wait in the hospital for 6 hrs. She is also having leg pain and having a hard time breathing so I transferred her to Triage to see what she needed to do.  ? ?Please advise.  ?

## 2022-02-23 DIAGNOSIS — J449 Chronic obstructive pulmonary disease, unspecified: Secondary | ICD-10-CM | POA: Diagnosis not present

## 2022-02-23 DIAGNOSIS — J9611 Chronic respiratory failure with hypoxia: Secondary | ICD-10-CM | POA: Diagnosis not present

## 2022-02-25 NOTE — Telephone Encounter (Signed)
--  Caller states was in the hospital on 4/19, blood ?work and x ray collected, left because she sat in the ?lobby for more than six hours and was told it would be ?another 5 hours. She has SOB and she has a burning ?sensation in her shins on both legs for a month or so ?and s/s are getting worse. Nosebleed x 2 since ER. ? ?02/22/2022 4:29:26 PM Go to ED Now Julie Ribas, RN, Julie Rice ? ?Comments ?User: Julie Mane, RN Date/Time Julie Rice Time): 02/22/2022 4:26:58 PM ?Having trouble accessing MyChart - password issues ? ?User: Julie Mane, RN Date/Time Julie Rice Time): 02/22/2022 4:33:00 PM ?Called backline to see how she can get portal access, they will send a link. ? ?Referrals ?[facility] TO FACILITY REFUSED ? ?02/25/22 1035: Pt states she feels SOB with simple activities such as walking to bathroom. States she vomited once in last 24hrs. Has trouble ambulating due to leg pain. Upon review of ED visit on 02/18/22 - labs drawn, cxr showing acute CHF. Appt scheduled with PCP for 02/26/22 to review labs, symptoms, & cxr. ?

## 2022-02-26 ENCOUNTER — Ambulatory Visit (INDEPENDENT_AMBULATORY_CARE_PROVIDER_SITE_OTHER): Payer: Medicare Other | Admitting: Adult Health

## 2022-02-26 ENCOUNTER — Encounter: Payer: Self-pay | Admitting: Adult Health

## 2022-02-26 ENCOUNTER — Telehealth: Payer: Self-pay | Admitting: Adult Health

## 2022-02-26 VITALS — BP 150/68 | HR 63 | Temp 97.8°F

## 2022-02-26 DIAGNOSIS — F339 Major depressive disorder, recurrent, unspecified: Secondary | ICD-10-CM

## 2022-02-26 DIAGNOSIS — I5022 Chronic systolic (congestive) heart failure: Secondary | ICD-10-CM

## 2022-02-26 LAB — CBC WITH DIFFERENTIAL/PLATELET
Basophils Absolute: 0.1 10*3/uL (ref 0.0–0.1)
Basophils Relative: 1.1 % (ref 0.0–3.0)
Eosinophils Absolute: 0.1 10*3/uL (ref 0.0–0.7)
Eosinophils Relative: 0.9 % (ref 0.0–5.0)
HCT: 30 % — ABNORMAL LOW (ref 36.0–46.0)
Hemoglobin: 9.5 g/dL — ABNORMAL LOW (ref 12.0–15.0)
Lymphocytes Relative: 9.1 % — ABNORMAL LOW (ref 12.0–46.0)
Lymphs Abs: 0.9 10*3/uL (ref 0.7–4.0)
MCHC: 31.5 g/dL (ref 30.0–36.0)
MCV: 86.4 fl (ref 78.0–100.0)
Monocytes Absolute: 0.6 10*3/uL (ref 0.1–1.0)
Monocytes Relative: 6.5 % (ref 3.0–12.0)
Neutro Abs: 8 10*3/uL — ABNORMAL HIGH (ref 1.4–7.7)
Neutrophils Relative %: 82.4 % — ABNORMAL HIGH (ref 43.0–77.0)
Platelets: 296 10*3/uL (ref 150.0–400.0)
RBC: 3.47 Mil/uL — ABNORMAL LOW (ref 3.87–5.11)
RDW: 16.6 % — ABNORMAL HIGH (ref 11.5–15.5)
WBC: 9.7 10*3/uL (ref 4.0–10.5)

## 2022-02-26 LAB — BASIC METABOLIC PANEL
BUN: 18 mg/dL (ref 6–23)
CO2: 25 mEq/L (ref 19–32)
Calcium: 9.1 mg/dL (ref 8.4–10.5)
Chloride: 104 mEq/L (ref 96–112)
Creatinine, Ser: 0.86 mg/dL (ref 0.40–1.20)
GFR: 62.16 mL/min (ref >60.00)
Glucose, Bld: 84 mg/dL (ref 70–99)
Potassium: 3.8 mEq/L (ref 3.5–5.1)
Sodium: 139 mEq/L (ref 135–145)

## 2022-02-26 LAB — VITAMIN B12: Vitamin B-12: 689 pg/mL (ref 211–911)

## 2022-02-26 LAB — BRAIN NATRIURETIC PEPTIDE: Pro B Natriuretic peptide (BNP): 3525 pg/mL — ABNORMAL HIGH (ref 0.0–100.0)

## 2022-02-26 MED ORDER — BUPROPION HCL ER (XL) 300 MG PO TB24: 300.0000 mg | ORAL_TABLET | Freq: Every day | ORAL | 1 refills | Status: DC

## 2022-02-26 NOTE — Progress Notes (Signed)
? ?Subjective:  ? ? Patient ID: Julie Rice, female    DOB: [birth year], 86 y.o.   MRN: [id number] ? ?HPI ? ?86 year old female who  has a past medical history of A-fib ([place]), Anemia, Anxiety, Bipolar disorder ([place]), Blood transfusion (1991), CAD (coronary artery disease), Cataract, Chest pain, CHF (congestive heart failure) ([place]) (07/2020), Chronic back pain, COPD (chronic obstructive pulmonary disease) ([place]), Depression, Esophageal stricture, Fibromyalgia, GERD (gastroesophageal reflux disease), Heart murmur, Hiatal hernia, Hyperlipidemia, Hypertension, Internal hemorrhoids, Lumbago, Mitral regurgitation, Osteoarthritis, Osteoporosis, Pneumonia (~ 01/2018), PONV (postoperative nausea and vomiting), Rectal bleeding, Rheumatoid arthritis ([place]), Spondylosis, TIA (transient ischemic attack) (2013), and Urinary incontinence. ? ?She presents to the office today with a friend for progressive worsening lower extremity edema and shortness of breath. ? ?Days ago she went to the emergency room for her symptoms, blood work showed a BNP of 1500 and her chest x-ray showed acute CHF.  She ultimately left the emergency room before being seen due to long wait times.  Since that time she feels as though her lower extremities have become larger and she is having more shortness of breath past baseline.  She has been taking her Lasix dose of 40 mg daily but reports that she is not urinating much. ? ?She denies chest pain.  ? ?Additionally, she reports worsening depression.  She is taking her prescribed Cymbalta 60 mg daily as well as Wellbutrin 150 mg extended release.  She is not leaving the house much nor does she go outside her home to sit outside.  ? ? ?Review of Systems ?See HPI  ? ?Past Medical History:  ?Diagnosis Date  ? A-fib ([place])   ? Anemia   ? Anxiety   ? Bipolar disorder ([place])   ? Blood transfusion 1991  ? autologous pts own blood given   ? CAD (coronary artery disease)   ? Cataract   ? Chest pain   ? "@ rest, lying  down, w/exertion"  ? CHF (congestive heart failure) ([place]) 07/2020  ? Chronic back pain   ? "mostly lower back but I do have upper back pain regularly" (04/06/2018)  ? COPD (chronic obstructive pulmonary disease) ([place])   ? Depression   ? Esophageal stricture   ? Fibromyalgia   ? GERD (gastroesophageal reflux disease)   ? Heart murmur   ? "slight" (04/06/2018)  ? Hiatal hernia   ? Hyperlipidemia   ? Patient denies  ? Hypertension   ? Internal hemorrhoids   ? Lumbago   ? Mitral regurgitation   ? Osteoarthritis   ? Osteoporosis   ? Pneumonia ~ 01/2018  ? PONV (postoperative nausea and vomiting)   ? severe ponv, "in the past" (04/06/2018)  ? Rectal bleeding   ? Rheumatoid arthritis ([place])   ? Spondylosis   ? TIA (transient ischemic attack) 2013  ? Urinary incontinence   ? wears depends  ? ? ?Social History  ? ?Socioeconomic History  ? Marital status: Widowed  ?  Spouse name: Not on file  ? Number of children: 2  ? Years of education: Not on file  ? Highest education level: 12th grade  ?Occupational History  ? Occupation: retired  ?  Employer: RETIRED  ?Tobacco Use  ? Smoking status: Never  ? Smokeless tobacco: Never  ?Vaping Use  ? Vaping Use: Never used  ?Substance and Sexual Activity  ? Alcohol use: Never  ? Drug use: Never  ? Sexual activity: Not Currently  ?  Comment: 1st intercourse 20 yo-1  partner  ?Other Topics Concern  ? Not on file  ?Social History Narrative  ? Retired   ? Widowed  ? ?Social Determinants of Health  ? ?Financial Resource Strain: Low Risk   ? Difficulty of Paying Living Expenses: Not hard at all  ?Food Insecurity: No Food Insecurity  ? Worried About Charity fundraiser in the Last Year: Never true  ? Ran Out of Food in the Last Year: Never true  ?Transportation Needs: No Transportation Needs  ? Lack of Transportation (Medical): No  ? Lack of Transportation (Non-Medical): No  ?Physical Activity: Inactive  ? Days of Exercise per Week: 0 days  ? Minutes of Exercise per Session: 0 min  ?Stress: No Stress  Concern Present  ? Feeling of Stress : Not at all  ?Social Connections: Moderately Integrated  ? Frequency of Communication with Friends and Family: More than three times a week  ? Frequency of Social Gatherings with Friends and Family: More than three times a week  ? Attends Religious Services: More than 4 times per year  ? Active Member of Clubs or Organizations: Yes  ? Attends Archivist Meetings: More than 4 times per year  ? Marital Status: Widowed  ?Intimate Partner Violence: Not At Risk  ? Fear of Current or Ex-Partner: No  ? Emotionally Abused: No  ? Physically Abused: No  ? Sexually Abused: No  ? ? ?Past Surgical History:  ?Procedure Laterality Date  ? ABDOMINAL HYSTERECTOMY  1982  ? BACK SURGERY    ? BALLOON DILATION N/A 09/21/2018  ? Procedure: BALLOON DILATION;  Surgeon: Irene Shipper, MD;  Location: [facility] ENDOSCOPY;  Service: Endoscopy;  Laterality: N/A;  ? BALLOON DILATION N/A 08/12/2019  ? Procedure: BALLOON DILATION;  Surgeon: Jackquline Denmark, MD;  Location: [facility] ENDOSCOPY;  Service: Endoscopy;  Laterality: N/A;  ? BIOPSY  08/12/2019  ? Procedure: BIOPSY;  Surgeon: Jackquline Denmark, MD;  Location: [facility] ENDOSCOPY;  Service: Endoscopy;;  ? BLADDER SUSPENSION  1980's  ? BOTOX INJECTION N/A 09/21/2018  ? Procedure: BOTOX INJECTION;  Surgeon: Irene Shipper, MD;  Location: [facility] ENDOSCOPY;  Service: Endoscopy;  Laterality: N/A;  ? BOTOX INJECTION N/A 08/12/2019  ? Procedure: BOTOX INJECTION;  Surgeon: Jackquline Denmark, MD;  Location: [facility] ENDOSCOPY;  Service: Endoscopy;  Laterality: N/A;  ? CATARACT EXTRACTION W/ INTRAOCULAR LENS  IMPLANT, BILATERAL Bilateral 2010  ? [place] OF UTERUS  1961  ? ESOPHAGEAL MANOMETRY N/A 03/25/2018  ? Procedure: ESOPHAGEAL MANOMETRY (EM);  Surgeon: Mauri Pole, MD;  Location: [facility] ENDOSCOPY;  Service: Endoscopy;  Laterality: N/A;  ? ESOPHAGOGASTRODUODENOSCOPY (EGD) WITH PROPOFOL N/A 04/07/2018  ? Procedure: ESOPHAGOGASTRODUODENOSCOPY (EGD) WITH PROPOFOL;  Surgeon: Doran Stabler, MD;  Location: [place];  Service: Gastroenterology;  Laterality: N/A;  ? ESOPHAGOGASTRODUODENOSCOPY (EGD) WITH PROPOFOL N/A 09/21/2018  ? Procedure: ESOPHAGOGASTRODUODENOSCOPY (EGD) WITH PROPOFOL;  Surgeon: Irene Shipper, MD;  Location: [facility] ENDOSCOPY;  Service: Endoscopy;  Laterality: N/A;  ? ESOPHAGOGASTRODUODENOSCOPY (EGD) WITH PROPOFOL N/A 08/12/2019  ? Procedure: ESOPHAGOGASTRODUODENOSCOPY (EGD) WITH PROPOFOL;  Surgeon: Jackquline Denmark, MD;  Location: [facility] ENDOSCOPY;  Service: Endoscopy;  Laterality: N/A;  ? HERNIA REPAIR    ? HIATAL HERNIA REPAIR N/A 08/02/2016  ? Procedure: LAPAROSCOPIC REPAIR OF LARGE  HIATAL HERNIA;  Surgeon: Excell Seltzer, MD;  Location: [facility] ORS;  Service: General;  Laterality: N/A;  ? JOINT REPLACEMENT    ? LAPAROSCOPIC NISSEN FUNDOPLICATION N/A [id number]  ? Procedure: LAPAROSCOPIC NISSEN FUNDOPLICATION;  Surgeon: Excell Seltzer, MD;  Location: [facility] ORS;  Service: General;  Laterality: N/A;  ? [place]; 1994; [id number]  ? "I've got 2 stainless steel rods; 6 screws; 2 ray cages"took bone from right hip to put in back  ? RIGHT/LEFT HEART CATH AND CORONARY ANGIOGRAPHY N/A 07/26/2019  ? Procedure: RIGHT/LEFT HEART CATH AND CORONARY ANGIOGRAPHY;  Surgeon: Belva Crome, MD;  Location: [place] CV LAB;  Service: Cardiovascular;  Laterality: N/A;  ? SVT ABLATION N/A 04/25/2021  ? Procedure: SVT ABLATION;  Surgeon: Constance Haw, MD;  Location: [place] CV LAB;  Service: Cardiovascular;  Laterality: N/A;  ? TEE WITHOUT CARDIOVERSION N/A 08/19/2019  ? Procedure: TRANSESOPHAGEAL ECHOCARDIOGRAM (TEE);  Surgeon: Lelon Perla, MD;  Location: [facility] ENDOSCOPY;  Service: Cardiovascular;  Laterality: N/A;  ? [place]  ? TOTAL KNEE ARTHROPLASTY Right ~ 1996  ? TUBAL LIGATION  ~ 1976  ? ? ?Family History  ?Problem Relation Age of Onset  ? Kidney disease Mother   ? Hypertension Mother   ? Heart disease Father 67  ?     die of MI at age  63  ? Suicidality Son   ? Heart disease Brother   ? Heart attack Brother   ? Colon cancer Neg Hx   ? Esophageal cancer Neg Hx   ? Pancreatic cancer Neg Hx   ? Rectal cancer Neg Hx   ? Stomach cancer Neg Hx

## 2022-02-26 NOTE — Telephone Encounter (Signed)
Updated patient on her labs. BNP has increase to 3000+ .  ? ?Will have her take an extra 40 mg of Lasix tonight. Will touch base with her tomorrow once I hear back from cardiology  ?

## 2022-02-27 ENCOUNTER — Telehealth: Payer: Self-pay

## 2022-02-27 ENCOUNTER — Other Ambulatory Visit: Payer: Self-pay | Admitting: Adult Health

## 2022-02-27 DIAGNOSIS — R0600 Dyspnea, unspecified: Secondary | ICD-10-CM

## 2022-02-27 MED ORDER — SPIRONOLACTONE 25 MG PO TABS: 25.0000 mg | ORAL_TABLET | Freq: Every day | ORAL | 3 refills | Status: DC

## 2022-02-27 NOTE — Telephone Encounter (Signed)
-----   Message from Wendall Stade, MD sent at 02/26/2022  8:11 PM EDT ----- ?Can add aldactone 25 mg  ? ?Pam can you have her see DOD this week in office She should have another echo as her last EF was noraml ?----- Message ----- ?From: Shirline Frees, NP ?Sent: 02/26/2022   4:54 PM EDT ?To: Wendall Stade, MD ? ?Dr. Eden Emms,  ? ?I saw our mutual patient in the office today for worsening shortness of breath and lower extremity edema. She went to the ER on 02/18/2022 and had blood work drawn which showed a BNP of 1547 and chest xray showed acute CHF but left before she could be treated due to wait.  ? ?I redrew the labs in the office today and her BNP is 3,525. GFR is 62.  ? ?She is on lasix 40 mg daily and does report that she is taking this. She looked stable in the office today.  ? ?I am going to have her follow up with you but wanted to see if you wanted me to add spirolactone to her lasix or switch her over to torsemide in the meantime.  ? ?Hope all is well  ? ?Thanks ? ?[name]  ? ? ? ? ?

## 2022-02-27 NOTE — Telephone Encounter (Signed)
Left message for patient to call back  

## 2022-02-27 NOTE — Telephone Encounter (Signed)
Patient called back and informed her of Dr. Fabio Bering recommendations. Patient will start aldactone 25 mg and see Dr. Eldridge Dace tomorrow. Order placed for echo, scheduling will call to schedule. Patient verbalized understanding. ?

## 2022-02-28 ENCOUNTER — Telehealth: Payer: Self-pay | Admitting: *Deleted

## 2022-02-28 ENCOUNTER — Encounter: Payer: Self-pay | Admitting: Interventional Cardiology

## 2022-02-28 ENCOUNTER — Ambulatory Visit: Payer: Medicare Other | Admitting: Interventional Cardiology

## 2022-02-28 VITALS — BP 138/74 | HR 66 | Ht 61.0 in | Wt 142.2 lb

## 2022-02-28 DIAGNOSIS — R9431 Abnormal electrocardiogram [ECG] [EKG]: Secondary | ICD-10-CM

## 2022-02-28 DIAGNOSIS — M25472 Effusion, left ankle: Secondary | ICD-10-CM

## 2022-02-28 DIAGNOSIS — H40013 Open angle with borderline findings, low risk, bilateral: Secondary | ICD-10-CM | POA: Diagnosis not present

## 2022-02-28 DIAGNOSIS — H5213 Myopia, bilateral: Secondary | ICD-10-CM | POA: Diagnosis not present

## 2022-02-28 DIAGNOSIS — E782 Mixed hyperlipidemia: Secondary | ICD-10-CM

## 2022-02-28 DIAGNOSIS — H353131 Nonexudative age-related macular degeneration, bilateral, early dry stage: Secondary | ICD-10-CM | POA: Diagnosis not present

## 2022-02-28 DIAGNOSIS — I5033 Acute on chronic diastolic (congestive) heart failure: Secondary | ICD-10-CM | POA: Diagnosis not present

## 2022-02-28 DIAGNOSIS — R0600 Dyspnea, unspecified: Secondary | ICD-10-CM | POA: Diagnosis not present

## 2022-02-28 DIAGNOSIS — H04123 Dry eye syndrome of bilateral lacrimal glands: Secondary | ICD-10-CM | POA: Diagnosis not present

## 2022-02-28 DIAGNOSIS — M25471 Effusion, right ankle: Secondary | ICD-10-CM

## 2022-02-28 MED ORDER — FUROSEMIDE 40 MG PO TABS: ORAL_TABLET | ORAL | 3 refills | Status: DC

## 2022-02-28 MED ORDER — POTASSIUM CHLORIDE CRYS ER 20 MEQ PO TBCR: 20.0000 meq | EXTENDED_RELEASE_TABLET | Freq: Every day | ORAL | 3 refills | Status: DC

## 2022-02-28 NOTE — Progress Notes (Signed)
?  ?Cardiology Office Note ? ? ?Date:  02/28/2022  ? ?Julie Rice, DOB 1936-06-11, MRN [id number] ? ?PCP:  [name], NP  ? ? ?No chief complaint on file. ? ?Acute on chronic diastolic heart failure ? ?Wt Readings from Last 3 Encounters:  ?02/28/22 142 lb 3.2 oz (64.5 kg)  ?02/14/22 158 lb (71.7 kg)  ?10/16/21 158 lb (71.7 kg)  ?  ? ?  ?History of Present Illness: ?Julie Rice is a 86 y.o. female   with a past medical history significant for hypertension, hyperlipidemia, systolic CHF, fibromyalgia, anemia, esophageal stricture status post balloon dilatation, large symptomatic hiatal hernia status post fundoplication, chronic dysphagia, asthma/COPD on oxygen at 2 L at home, bipolar disorder, rheumatoid arthritis, TIA and chronic urinary incontinence.  Right and left heart cath on 07/26/2019 showed only mild nonobstructive disease and low right heart pressures.08/16/19 had PAF EF 40-45% by TTE with convertion using amiodarone Sees pulmonary with ambulatory desats on oxygen Having some headaches and low BP evening coreg held at times  ?  ?Continues to have significant dyspnea   ?  ?Echo 01/12/20 showed improved EF 50-55% just mild LAE, MR and AR ?  ?Tested positive for COVID 03/22/20 Not hospitalized Most recent CXR in Epic ?06/30/20 showed ? Early LLL pneumonia Seen in ER for chest pain and dyspnea 06/30/20 Resolved spontaneously Troponin negative BNP only minimally elevated  Rx with Rocephin/ Augmentin for community acquired pneumonia Had run out of her Klonopin and ? Some BZ withdrawal  ?  ?[facility]/20 Cardioverted in ER for hypotension She was continued on amiodarone 200 gm daily ?Along with coreg 6.25 bid ?  ?EF ultimately improved to 50 to 55% on medical therapy. ? ?In April 2023, she was seen in the emergency room for chest pressure which has been going on for 2 months and increased swelling which been going on for a few days. ? ?Feels SHOB and leg swelling.  She has been  eating frozen dinners regularly. She has been taking her lasix QOD for the past few weeks.  In the past, she took it daily and her swelling came down. SHe has had leg cramps and low potassium.  ? ?Past Medical History:  ?Diagnosis Date  ? A-fib ([place])   ? Anemia   ? Anxiety   ? Bipolar disorder ([place])   ? Blood transfusion 1991  ? autologous pts own blood given   ? CAD (coronary artery disease)   ? Cataract   ? Chest pain   ? "@ rest, lying down, w/exertion"  ? CHF (congestive heart failure) ([place]) 07/2020  ? Chronic back pain   ? "mostly lower back but I do have upper back pain regularly" (04/06/2018)  ? COPD (chronic obstructive pulmonary disease) ([place])   ? Depression   ? Esophageal stricture   ? Fibromyalgia   ? GERD (gastroesophageal reflux disease)   ? Heart murmur   ? "slight" (04/06/2018)  ? Hiatal hernia   ? Hyperlipidemia   ? Patient denies  ? Hypertension   ? Internal hemorrhoids   ? Lumbago   ? Mitral regurgitation   ? Osteoarthritis   ? Osteoporosis   ? Pneumonia ~ 01/2018  ? PONV (postoperative nausea and vomiting)   ? severe ponv, "in the past" (04/06/2018)  ? Rectal bleeding   ? Rheumatoid arthritis ([place])   ? Spondylosis   ? TIA (transient ischemic attack) 2013  ? Urinary incontinence   ?  wears depends  ? ? ?Past Surgical History:  ?Procedure Laterality Date  ? ABDOMINAL HYSTERECTOMY  1982  ? BACK SURGERY    ? BALLOON DILATION N/A 09/21/2018  ? Procedure: BALLOON DILATION;  Surgeon: Irene Shipper, MD;  Location: [facility] END[facility];  Service: Endoscopy;  Laterality: N/A;  ? BALLOON DILATION N/A 08/12/2019  ? Procedure: BALLOON DILATION;  Surgeon: Jackquline Denmark, MD;  Location: [facility] ENDOSCOPY;  Service: Endoscopy;  Laterality: N/A;  ? BIOPSY  08/12/2019  ? Procedure: BIOPSY;  Surgeon: Jackquline Denmark, MD;  Location[facility] END[facility]COPY;  Service: Endoscopy;;  ? BLADDER SUSPENSION  1980's  ? BOTOX INJECTION N/A 09/21/2018  ? Procedure: BOTOX INJECTION;  Surgeon: Irene Shipper, MD;  Location: [facility] ENDOS[facility];  Service: Endoscopy;   Laterality: N/A;  ? BOTOX INJECTION N/A 08/12/2019  ? Procedure: BOTOX INJECTION;  Surgeon: Jackquline Denmark, MD;  Location: [facility] ENDOSCOPY;  Service: Endoscopy;  Laterality: N/A;  ? CATARACT EXTRACTION W/ INTRAOCULAR LENS  IMPLANT, BILATERAL Bilateral 2010  ? [place] OF UTERUS  1961  ? ESOPHAGEAL MANOMETRY N/A 03/25/2018  ? Procedure: ESOPHAGEAL MANOMETRY (EM);  Surgeon: Mauri Pole, MD;  Location: [facility] ENDOSCOPY;  Service: Endoscopy;  Laterality: N/A;  ? ESOPHAGOGASTRODUODENOSCOPY (EGD) WITH PROPOFOL N/A 04/07/2018  ? Procedure: ESOPHAGOGASTRODUODENOSCOPY (EGD) WITH PROPOFOL;  Surgeon: Doran Stabler, MD;  Location: [facility];  Service: Gastroenterology;  Laterality: N/A;  ? ESOPHAGOGASTRODUODENOSCOPY (EGD) WITH PROPOFOL N/A 09/21/2018  ? Procedure: ESOPHAGOGASTRODUODENOSCOPY (EGD) WITH PROPOFOL;  Surgeon: Irene Shipper, MD;  Location: [facility] ENDOSCOPY;  Service: Endoscopy;  Laterality: N/A;  ? ESOPHAGOGASTRODUODENOSCOPY (EGD) WITH PROPOFOL N/A 08/12/2019  ? Procedure: ESOPHAGOGASTRODUODENOSCOPY (EGD) WITH PROPOFOL;  Surgeon: Jackquline Denmark, MD;  Location: [facility] ENDOSCOPY;  Service: Endoscopy;  Laterality: N/A;  ? HERNIA REPAIR    ? HIATAL HERNIA REPAIR N/A 08/02/2016  ? Procedure: LAPAROSCOPIC REPAIR OF LARGE  HIATAL HERNIA;  Surgeon: Excell Seltzer, MD;  Location: [facility] ORS;  Service: General;  Laterality: N/A;  ? JOINT REPLACEMENT    ? LAPAROSCOPIC NISSEN FUNDOPLICATION N/A [id number]  ? Procedure: LAPAROSCOPIC NISSEN FUNDOPLICATION;  Surgeon: Excell Seltzer, MD;  Location: [facility] ORS;  Service: General;  Laterality: N/A;  ? [place]; [id number]  ? "I've got 2 stainless steel rods; 6 screws; 2 ray cages"took bone from right hip to put in back  ? RIGHT/LEFT HEART CATH AND CORONARY ANGIOGRAPHY N/A 07/26/2019  ? Procedure: RIGHT/LEFT HEART CATH AND CORONARY ANGIOGRAPHY;  Surgeon: Belva Crome, MD;  Location: [facility] CV LAB;  Service: Cardiovascular;  Laterality: N/A;  ? SVT  ABLATION N/A 04/25/2021  ? Procedure: SVT ABLATION;  Surgeon: Constance Haw, MD;  Location: [place] CV LAB;  Service: Cardiovascular;  Laterality: N/A;  ? TEE WITHOUT CARDIOVERSION N/A 08/19/2019  ? Procedure: TRANSESOPHAGEAL ECHOCARDIOGRAM (TEE);  Surgeon: Lelon Perla, MD;  Location: [facility] ENDOSCOPY;  Service: Cardiovascular;  Laterality: N/A;  ? [place]  ? TOTAL KNEE ARTHROPLASTY Right ~ 1996  ? TUBAL LIGATION  ~ 1976  ? ? ? ?Current Outpatient Medications  ?Medication Sig Dispense Refill  ? acetaminophen (TYLENOL) 500 MG tablet Take 1,000 mg by mouth every 6 (six) hours as needed for moderate pain, headache or fever.    ? albuterol (VENTOLIN HFA) 108 (90 Base) MCG/ACT inhaler TAKE 2 PUFFS BY MOUTH EVERY 6 HOURS AS NEEDED FOR WHEEZE OR SHORTNESS OF BREATH 8.5 each 2  ? benzonatate (TESSALON PERLES) 100 MG capsule Take 1 capsule (100 mg total)  by mouth 3 (three) times daily as needed. 20 capsule 0  ? buPROPion (WELLBUTRIN XL) 300 MG 24 hr tablet Take 1 tablet (300 mg total) by mouth daily. 90 tablet 1  ? Carboxymethylcellulose Sodium (REFRESH TEARS OP) Place 1 drop into both eyes 2 (two) times daily.    ? cetirizine (ZYRTEC) 10 MG tablet Take 10 mg by mouth daily as needed for allergies.    ? clonazePAM (KLONOPIN) 0.5 MG tablet TAKE 1 TABLET BY MOUTH TWICE A DAY AS NEEDED FOR ANXIETY 60 tablet 2  ? DULoxetine (CYMBALTA) 60 MG capsule TAKE 1 CAPSULE BY MOUTH EVERY DAY 90 capsule 3  ? ELIQUIS 5 MG TABS tablet TAKE 1 TABLET BY MOUTH TWICE A DAY 180 tablet 1  ? furosemide (LASIX) 40 MG tablet Take one tablet by mouth twice daily for 2 days and then decrease to one tablet by mouth daily 35 tablet 3  ? gabapentin (NEURONTIN) 300 MG capsule TAKE 1 CAPSULE BY MOUTH THREE TIMES A DAY 270 capsule 1  ? metoprolol tartrate (LOPRESSOR) 50 MG tablet TAKE 1 TABLET BY MOUTH TWICE A DAY 180 tablet 0  ? Multiple Vitamins-Minerals (ONE-A-DAY WOMENS 50+) TABS Take 1 tablet by mouth daily.     ? omeprazole (PRILOSEC) 20 MG capsule TAKE 1 CAPSULE BY MOUTH EVERY DAY 90 capsule 3  ? potassium chloride SA (KLOR-CON M) 20 MEQ tablet Take 1 tablet (20 mEq total) by mouth daily. 30 tablet 3  ? sodium chloride (

## 2022-02-28 NOTE — Patient Instructions (Addendum)
Medication Instructions:  ?Your physician has recommended you make the following change in your medication:  ?Take furosemide 40 mg by mouth twice daily for 2 days and then decrease to 40 mg once daily. ?Start potassium 20 meq by mouth once daily ? ?*If you need a refill on your cardiac medications before your next appointment, please call your pharmacy* ? ? ?Lab Work: ?Your physician recommends that you return for lab work on Mar 05, 2022--BMP.  This is not fasting ? ?If you have labs (blood work) drawn today and your tests are completely normal, you will receive your results only by: ?MyChart Message (if you have MyChart) OR ?A paper copy in the mail ?If you have any lab test that is abnormal or we need to change your treatment, we will call you to review the results. ? ? ?Testing/Procedures: ?Your physician has requested that you have an echocardiogram. Echocardiography is a painless test that uses sound waves to create images of your heart. It provides your doctor with information about the size and shape of your heart and how well your heart?s chambers and valves are working. This procedure takes approximately one hour. There are no restrictions for this procedure. ? ? ? ?Follow-Up: ?At [facility], you and your health needs are our priority.  As part of our continuing mission to provide you with exceptional heart care, we have created designated Provider Care Teams.  These Care Teams include your primary Cardiologist (physician) and Advanced Practice Providers (APPs -  Physician Assistants and Nurse Practitioners) who all work together to provide you with the care you need, when you need it. ? ?We recommend signing up for the patient portal called "MyChart".  Sign up information is provided on this After Visit Summary.  MyChart is used to connect with patients for Virtual Visits (Telemedicine).  Patients are able to view lab/test results, encounter notes, upcoming appointments, etc.  Non-urgent messages can  be sent to your provider as well.   ?To learn more about what you can do with MyChart, go to [email or web address].   ? ?Your next appointment:   ?2-3 week(s) ? ?The format for your next appointment:   ?In Person ? ?Provider:   ?Charlton Haws, MD or APP ? ?If primary card or EP is not listed click here to update    :1}  ? ? ?Other Instructions ? ? ?Important Information About Sugar ? ? ? ? ? ? ?

## 2022-02-28 NOTE — Telephone Encounter (Signed)
Dr Eldridge Dace would like patient to have nurse room visit for EKG when here for lab work on May 2.  I placed call to patient to schedule this appointment.  Left message to call office ?

## 2022-03-01 NOTE — Telephone Encounter (Signed)
Patient notified.  She will come in at 11:00 on 5/2 for lab work and EKG ?

## 2022-03-05 ENCOUNTER — Other Ambulatory Visit: Payer: Medicare Other | Admitting: *Deleted

## 2022-03-05 ENCOUNTER — Ambulatory Visit (INDEPENDENT_AMBULATORY_CARE_PROVIDER_SITE_OTHER): Payer: Medicare Other | Admitting: *Deleted

## 2022-03-05 VITALS — HR 55 | Ht 61.0 in

## 2022-03-05 DIAGNOSIS — I5033 Acute on chronic diastolic (congestive) heart failure: Secondary | ICD-10-CM | POA: Diagnosis not present

## 2022-03-05 DIAGNOSIS — R0600 Dyspnea, unspecified: Secondary | ICD-10-CM

## 2022-03-05 DIAGNOSIS — R002 Palpitations: Secondary | ICD-10-CM

## 2022-03-05 DIAGNOSIS — I4949 Other premature depolarization: Secondary | ICD-10-CM

## 2022-03-05 NOTE — Progress Notes (Signed)
? ?  Nurse Visit  ? ?Date of Encounter: 03/05/2022 ?Julie Rice, DOB 12-Dec-1935, MRN [id number] ? ?PCP:  [name], NP ?  ?[facility] Providers ?Cardiologist:  Jenkins Rouge, MD ?Electrophysiologist:  Will Meredith Leeds, MD    ? ? ?Visit Details  ? ?VS:  There were no vitals taken for this visit. , BMI There is no height or weight on file to calculate BMI. ? ?Wt Readings from Last 3 Encounters:  ?02/28/22 142 lb 3.2 oz (64.5 kg)  ?02/14/22 158 lb (71.7 kg)  ?10/16/21 158 lb (71.7 kg)  ?  ? ?Reason for visit: EKG to follow up on junctional rhythm ?Performed today: Vitals, EKG, Provider consulted: , and Education ?Changes (medications, testing, etc.) : 7 day monitor ordered ?Length of Visit: 30 minutes ? ?Pt does have some SOB, along with LEE (this is known/addressed by [place]).  While performing EKG, noticed a lot of ectopy on EKGs.  Discussed with DOD, Dr. Johney Frame, decision to continue BB at this time being that pt is asymptomatic, also decision to order 7 day holter monitor to see how much ectopy/junctional pt is having. ? ?Medications Adjustments/Labs and Tests Ordered: ?Orders Placed This Encounter  ?Procedures  ? LONG TERM MONITOR (3-14 DAYS)  ? EKG 12-Lead  ? ?No orders of the defined types were placed in this encounter. ? ? ? ?Signed, ?Stanton Kidney, RN  ?03/05/2022 12:34 PM ? ?

## 2022-03-06 ENCOUNTER — Ambulatory Visit (INDEPENDENT_AMBULATORY_CARE_PROVIDER_SITE_OTHER): Payer: Medicare Other

## 2022-03-06 ENCOUNTER — Telehealth: Payer: Self-pay | Admitting: *Deleted

## 2022-03-06 DIAGNOSIS — I4949 Other premature depolarization: Secondary | ICD-10-CM

## 2022-03-06 DIAGNOSIS — R0600 Dyspnea, unspecified: Secondary | ICD-10-CM

## 2022-03-06 DIAGNOSIS — J449 Chronic obstructive pulmonary disease, unspecified: Secondary | ICD-10-CM

## 2022-03-06 DIAGNOSIS — R002 Palpitations: Secondary | ICD-10-CM

## 2022-03-06 LAB — BASIC METABOLIC PANEL
BUN/Creatinine Ratio: 24 (ref 12–28)
BUN: 19 mg/dL (ref 8–27)
CO2: 30 mmol/L — ABNORMAL HIGH (ref 20–29)
Calcium: 9.2 mg/dL (ref 8.7–10.3)
Chloride: 102 mmol/L (ref 96–106)
Creatinine, Ser: 0.79 mg/dL (ref 0.57–1.00)
Glucose: 85 mg/dL (ref 70–99)
Potassium: 4.8 mmol/L (ref 3.5–5.2)
Sodium: 141 mmol/L (ref 134–144)
eGFR: 74 mL/min/{1.73_m2} (ref >59)

## 2022-03-06 NOTE — Progress Notes (Unsigned)
[id number] zio xt from office inventory applied in office. ?

## 2022-03-06 NOTE — Telephone Encounter (Signed)
-----   Message from Corky Crafts, MD sent at 03/06/2022  1:42 PM EDT ----- ?BMet stable. K to take Lasix 40 - 80 mg daily based on her swelling. Can see her back after monitor ?

## 2022-03-06 NOTE — Telephone Encounter (Signed)
Patient notified.  She reports she had no improvement in shortness of breath and swelling after taking increased dose of furosemide.  States she is OK at rest but has shortness of breath when up moving around.  She uses albuterol and this helps.  Uses oxygen at night.  She weighs every other day and most recent weight was down 2 lbs.  She will begin weighing everyday. She is currently wearing monitor and has echo scheduled for May 12 and follow up appointment on May 18. ?Will forward to MD to see if anything else needed at this time.  ?

## 2022-03-07 ENCOUNTER — Other Ambulatory Visit: Payer: Medicare Other

## 2022-03-07 NOTE — Progress Notes (Signed)
?Cardiology Office Note:   ? ?Date:  03/08/2022  ? ?IDTANESA Rice, DOB 1936/10/29, MRN [id number] ? ?PCP:  [name], NP  ? ?[facility] Providers ?Cardiologist:  Lenna Sciara, MD ?Referring MD: [name], NP  ? ?Chief Complaint/Reason for Referral: Dyspnea ? ?ASSESSMENT:   ? ?1. Acute on chronic diastolic congestive heart failure ([place])   ?2. PAF (paroxysmal atrial fibrillation) ([place])   ?3. Chronic obstructive pulmonary disease, unspecified COPD type ([place])   ?4. Aortic atherosclerosis ([place])   ? ? ?PLAN:   ? ?In order of problems listed above: ?1.  Acute on chronic diastolic heart failure: The patient is somewhat improved after few days of twice daily Lasix.  I will follow-up her BMP, BNP, and chest x-ray today.  If her chest x-ray shows a large pleural effusion she may need admission for drainage though her exam did not suggest this.  She lacks a fever or productive cough so I think pneumonia is less likely as well..  If her creatinine is reasonable today I will start her on routine twice daily Lasix at 40 mg and check a BMP next week.  Given her diastolic dysfunction we will start her on Jardiance 10 mg daily.  Her GFR earlier this week was appropriate.  I do not think her issues stem from her rhythm as she is in normal sinus rhythm today though still complains of palpitations here in the office.  We will follow-up her monitor which is in place.  She has an echocardiogram to be done next week.  I do not hear any significant murmurs on exam.  She will follow-up as previously scheduled. ?2.  Paroxysmal atrial fibrillation: EKG today demonstrates normal sinus rhythm.  She has a Holter monitor in place.  Continue Eliquis. ?3.  COPD: She does not seem to be in a COPD exacerbation today. ?4.  Aortic atherosclerosis:  Continue Eliquis in lieu of aspirin, while a statin could be considered given this patient's advanced age is not strictly required. ? ? ?Dispo:  No follow-ups on file.  ? ?  ? ?Medication  Adjustments/Labs and Tests Ordered: ?Current medicines are reviewed at length with the patient today.  Concerns regarding medicines are outlined above. ? ?The following changes have been made:    ? ?Labs/tests ordered: ?Orders Placed This Encounter  ?Procedures  ? Basic metabolic panel  ? Pro b natriuretic peptide (BNP)  ? ? ?Medication Changes: ?Meds ordered this encounter  ?Medications  ? empagliflozin (JARDIANCE) 10 MG TABS tablet  ?  Sig: Take 1 tablet (10 mg total) by mouth daily before breakfast.  ?  Dispense:  90 tablet  ?  Refill:  3  ? ? ? ?Current medicines are reviewed at length with the patient today.  The patient does not have concerns regarding medicines. ? ? ?History of Present Illness:   ? ?FOCUSED PROBLEM LIST:   ?1.  Mild nonobstructive coronary artery disease on cardiac catheterization 2020 ?2.  Paroxysmal atrial fibrillation on Eliquis ?3.  Chronic diastolic heart failure ?4.  Hyperlipidemia ?5.  COPD on supplemental oxygen ?6.  Hypertension ?7.  Hyperlipidemia ?8.  Status post SVT ablation 2020 ?9.  Aortic atherosclerosis on CT abdomen pelvis 2023 ?10.  Frailty ? ?The patient is a 86 y.o. female with the indicated medical history here for expedited appointment due to ongoing dyspnea.  I am seeing the patient as the DOD as the patient is normally cared for by [name].  The patient was last  seen by Dr. Livingston Diones earlier this week.  At that point in time she reported ongoing dyspnea.  Her Lasix was increased to twice a day for 2 days and Aldactone was added.  proBNP was found to be 3500 and BMP drawn 2 days ago was relatively stable.  Monitor was placed and echocardiogram ordered which is to be done on May 12. ? ?She felt like her leg swelling improved quite a bit with the twice a day Lasix.  She is back down to once a day Lasix.  She sometimes feels palpitations when she gets short of breath.  She chronically sleeps on a wedge and that has not really changed.  Her weight has remained relatively  stable.  She is short of breath with minimal exertion.  She denies any exertional angina. ? ?EKG today demonstrated normal sinus rhythm. ? ?  ? ?Current Medications: ?Current Meds  ?Medication Sig  ? empagliflozin (JARDIANCE) 10 MG TABS tablet Take 1 tablet (10 mg total) by mouth daily before breakfast.  ?  ? ?Allergies:    ?Tape, Tramadol, and Morphine  ? ?Social History:   ?Social History  ? ?Tobacco Use  ? Smoking status: Never  ? Smokeless tobacco: Never  ?Vaping Use  ? Vaping Use: Never used  ?Substance Use Topics  ? Alcohol use: Never  ? Drug use: Never  ?  ? ?Family Hx: ?Family History  ?Problem Relation Age of Onset  ? Kidney disease Mother   ? Hypertension Mother   ? Heart disease Father 80  ?     die of MI at age 68  ? Suicidality Son   ? Heart disease Brother   ? Heart attack Brother   ? Colon cancer Neg Hx   ? Esophageal cancer Neg Hx   ? Pancreatic cancer Neg Hx   ? Rectal cancer Neg Hx   ? Stomach cancer Neg Hx   ?  ? ?Review of Systems:   ?Please see the history of present illness.    ?All other systems reviewed and are negative. ?  ? ? ?EKGs/Labs/Other Test Reviewed:   ? ?EKG:  EKG performed Mar 05, 2022 that I personally reviewed demonstrates likely sinus rhythm with PACs and a fusion beat about P waves are difficult to appreciate; EKG performed today that I personally reviewed demonstrates normal sinus rhythm with anterior infarction pattern. ? ?Prior CV studies: ? ?TTE 2022 demonstrates an ejection fraction of 60 to 65% with mild left ventricular hypertrophy and no significant mitral regurgitation or aortic valvular disease. ? ?Other studies Reviewed: ?Review of the additional studies/records demonstrates: CT abdomen pelvis 2023 demonstrates aortic atherosclerosis ? ?Recent Labs: ?04/04/2021: Magnesium 2.0; TSH 1.588 ?02/18/2022: ALT 19; B Natriuretic Peptide 1,547.1 ?02/26/2022: Hemoglobin 9.5; Platelets 296.0; Pro B Natriuretic peptide (BNP) 3,525.0 ?03/05/2022: BUN 19; Creatinine, Ser 0.79; Potassium  4.8; Sodium 141  ? ?Recent Lipid Panel ?Lab Results  ?Component Value Date/Time  ? CHOL 172 03/09/2020 09:36 AM  ? TRIG 111.0 03/09/2020 09:36 AM  ? HDL 65.60 03/09/2020 09:36 AM  ? LDLCALC 84 03/09/2020 09:36 AM  ? LDLDIRECT 122.2 06/01/2013 09:44 AM  ? ? ?Risk Assessment/Calculations:   ? ? ?CHA2DS2-VASc Score = 5  ? This indicates a 7.2% annual risk of stroke. ?The patient's score is based upon: ?CHF History: 1 ?HTN History: 1 ?Diabetes History: 0 ?Stroke History: 0 ?Vascular Disease History: 0 ?Age Score: 2 ?Gender Score: 1 ?  ? ?    ? ?Physical Exam:   ? ?VS:  BP 128/72   Pulse 72   Ht 5\' 1"  (1.549 m)   Wt 142 lb 8 oz (64.6 kg)   SpO2 94%   BMI 26.93 kg/m?    ?Wt Readings from Last 3 Encounters:  ?03/08/22 142 lb 8 oz (64.6 kg)  ?02/28/22 142 lb 3.2 oz (64.5 kg)  ?02/14/22 158 lb (71.7 kg)  ?  ?GENERAL:  No apparent distress, AOx3 ?HEENT:  No carotid bruits, +2 carotid impulses, no scleral icterus, JVD does not appear elevated ?CAR: RRR no murmurs, gallops, rubs, or thrills ?RES: Mild rales at bilateral bases ?ABD:  Soft, nontender, nondistended, positive bowel sounds x 4 ?VASC:  +2 radial pulses, +2 carotid pulses, palpable pedal pulses ?NEURO:  CN 2-12 grossly intact; motor and sensory grossly intact ?PSYCH:  No active depression or anxiety ?EXT:  No edema, ecchymosis, or cyanosis ? ?Signed, ?Early Osmond, MD  ?03/08/2022 12:36 PM    ?[place] ?[address], [place], [place]  [address] ?Phone: [phone]; Fax: [phone]  ? ?Note:  This document was prepared using Dragon voice recognition software and may include unintentional dictation errors. ?

## 2022-03-07 NOTE — Telephone Encounter (Signed)
Per Dr. Eden Emms, She has COPD as well should have BMET/BNP,  CXR and see DOD tomorrow or Monday  ? ?Called patient about message. Patient stated she might could get a ride to come to the appointment with the DOD tomorrow. Since patient has to depend on transportation. Will have patient get lab work at her office visit and hopefull she can get chest xray done same day at [facility] at [address].  ? ?Will send message to covering nurse for Dr. Lynnette Caffey tomorrow, so she can make sure patient gets lab work (needs orders) and goes to get chest xray (order is in) after appointment.  ?

## 2022-03-08 ENCOUNTER — Encounter: Payer: Self-pay | Admitting: Internal Medicine

## 2022-03-08 ENCOUNTER — Ambulatory Visit: Payer: Medicare Other | Admitting: Internal Medicine

## 2022-03-08 ENCOUNTER — Ambulatory Visit
Admission: RE | Admit: 2022-03-08 | Discharge: 2022-03-08 | Disposition: A | Discharge status: Home / Self Care | Payer: Medicare Other | Source: Ambulatory Visit | Attending: Cardiovascular Disease | Admitting: Cardiovascular Disease

## 2022-03-08 VITALS — BP 128/72 | HR 72 | Ht 61.0 in | Wt 142.5 lb

## 2022-03-08 DIAGNOSIS — I5033 Acute on chronic diastolic (congestive) heart failure: Secondary | ICD-10-CM | POA: Diagnosis not present

## 2022-03-08 DIAGNOSIS — I480 Paroxysmal atrial fibrillation: Secondary | ICD-10-CM

## 2022-03-08 DIAGNOSIS — I700 Atherosclerosis of aorta: Secondary | ICD-10-CM | POA: Diagnosis not present

## 2022-03-08 DIAGNOSIS — R0600 Dyspnea, unspecified: Secondary | ICD-10-CM

## 2022-03-08 DIAGNOSIS — J449 Chronic obstructive pulmonary disease, unspecified: Secondary | ICD-10-CM

## 2022-03-08 DIAGNOSIS — R0602 Shortness of breath: Secondary | ICD-10-CM | POA: Diagnosis not present

## 2022-03-08 MED ORDER — EMPAGLIFLOZIN 10 MG PO TABS: 10.0000 mg | ORAL_TABLET | Freq: Every day | ORAL | 3 refills | Status: DC

## 2022-03-08 NOTE — Telephone Encounter (Signed)
Pt was seen as planned See office note /cy ?

## 2022-03-08 NOTE — Patient Instructions (Signed)
Medication Instructions:  ?START JARDIANCE 10 MG EVERY DAY  ?*If you need a refill on your cardiac medications before your next appointment, please call your pharmacy* ? ? ?Lab Work: ?TODAY BMET AND BNP ?If you have labs (blood work) drawn today and your tests are completely normal, you will receive your results only by: ?MyChart Message (if you have MyChart) OR ?A paper copy in the mail ?If you have any lab test that is abnormal or we need to change your treatment, we will call you to review the results. ? ? ?Testing/Procedures: ?A chest x-ray takes a picture of the organs and structures inside the chest, including the heart, lungs, and blood vessels. This test can show several things, including, whether the heart is enlarges; whether fluid is building up in the lungs; and whether pacemaker / defibrillator leads are still in place.  ?[facility] [address] ? ?Follow-Up: ?At [facility], you and your health needs are our priority.  As part of our continuing mission to provide you with exceptional heart care, we have created designated Provider Care Teams.  These Care Teams include your primary Cardiologist (physician) and Advanced Practice Providers (APPs -  Physician Assistants and Nurse Practitioners) who all work together to provide you with the care you need, when you need it. ? ?We recommend signing up for the patient portal called "MyChart".  Sign up information is provided on this After Visit Summary.  MyChart is used to connect with patients for Virtual Visits (Telemedicine).  Patients are able to view lab/test results, encounter notes, upcoming appointments, etc.  Non-urgent messages can be sent to your provider as well.   ?To learn more about what you can do with MyChart, go to [email or web address].   ? ?Your next appointment:   ?KEEP APPOINTMENTS AS PLANNED ? ?The format for your next appointment:   ? ? ?Provider:   ?  ? ? ?Other Instructions ? ? ?Important Information About  Sugar ? ? ? ? ?  ?

## 2022-03-09 LAB — BASIC METABOLIC PANEL
BUN/Creatinine Ratio: 15 (ref 12–28)
BUN: 13 mg/dL (ref 8–27)
CO2: 28 mmol/L (ref 20–29)
Calcium: 9.5 mg/dL (ref 8.7–10.3)
Chloride: 100 mmol/L (ref 96–106)
Creatinine, Ser: 0.85 mg/dL (ref 0.57–1.00)
Glucose: 92 mg/dL (ref 70–99)
Potassium: 4.4 mmol/L (ref 3.5–5.2)
Sodium: 142 mmol/L (ref 134–144)
eGFR: 68 mL/min/{1.73_m2} (ref >59)

## 2022-03-09 LAB — PRO B NATRIURETIC PEPTIDE: NT-Pro BNP: 6969 pg/mL — ABNORMAL HIGH (ref 0–738)

## 2022-03-11 ENCOUNTER — Telehealth: Payer: Self-pay

## 2022-03-11 DIAGNOSIS — I5033 Acute on chronic diastolic (congestive) heart failure: Secondary | ICD-10-CM

## 2022-03-11 MED ORDER — FUROSEMIDE 40 MG PO TABS: 40.0000 mg | ORAL_TABLET | Freq: Two times a day (BID) | ORAL | 3 refills | Status: DC

## 2022-03-11 NOTE — Telephone Encounter (Signed)
-----   Message from Orbie Pyo, MD sent at 03/09/2022  5:18 PM EDT ----- ?I spoke to patient's sister in law and instructed her to increase lasix to 40mg  bid and would like bmp next week. ?

## 2022-03-11 NOTE — Telephone Encounter (Signed)
Spoke with the patient and scheduled her for lab work on Friday 5/12 when she comes in for her echocardiogram. Rx for lasix has been updated.  ?

## 2022-03-11 NOTE — Addendum Note (Signed)
Addended by: Anselm Pancoast on: 03/11/2022 03:17 PM ? ? Modules accepted: Orders ? ?

## 2022-03-15 ENCOUNTER — Ambulatory Visit (HOSPITAL_COMMUNITY): Payer: Medicare Other | Attending: Cardiology

## 2022-03-15 ENCOUNTER — Other Ambulatory Visit: Payer: Medicare Other | Admitting: *Deleted

## 2022-03-15 DIAGNOSIS — I5033 Acute on chronic diastolic (congestive) heart failure: Secondary | ICD-10-CM

## 2022-03-15 DIAGNOSIS — R0600 Dyspnea, unspecified: Secondary | ICD-10-CM | POA: Insufficient documentation

## 2022-03-15 LAB — BASIC METABOLIC PANEL
BUN/Creatinine Ratio: 21 (ref 12–28)
BUN: 20 mg/dL (ref 8–27)
CO2: 30 mmol/L — ABNORMAL HIGH (ref 20–29)
Calcium: 9.3 mg/dL (ref 8.7–10.3)
Chloride: 96 mmol/L (ref 96–106)
Creatinine, Ser: 0.96 mg/dL (ref 0.57–1.00)
Glucose: 107 mg/dL — ABNORMAL HIGH (ref 70–99)
Potassium: 4.5 mmol/L (ref 3.5–5.2)
Sodium: 137 mmol/L (ref 134–144)
eGFR: 58 mL/min/{1.73_m2} — ABNORMAL LOW (ref >59)

## 2022-03-15 LAB — ECHOCARDIOGRAM COMPLETE
Area-P 1/2: 2.94 cm2
S' Lateral: 3.4 cm

## 2022-03-20 NOTE — Progress Notes (Deleted)
Cardiology Office Note:    Date:  03/20/2022   ID:  Julie Rice, DOB 1936-10-03, MRN [id number]  PCP:  [name], NP   [facility] Providers Cardiologist:  Jenkins Rouge, MD Electrophysiologist:  Will Meredith Leeds, MD { Click to update primary MD,subspecialty MD or APP then REFRESH:1}    Referring MD: [name], NP   Chief Complaint: ***  History of Present Illness:    Julie Rice is a *** 86 y.o. female with a hx of PAF, asthma/COPD on home O2, chronic HFpEF, aortic atherosclerosis by CT, for myalgia, anemia, esophageal stricture status post balloon dilation, large symptomatic hiatal hernia s/p fundoplication, chronic dysphagia, bipolar disorder, RA, TIA, and chronic urinary incontinence.  Right and left heart cath 07/26/2019 showed only mild nonobstructive disease and low right heart pressures.  08/16/2019 patient had PAF with EF 40 to 45% by TTE with conversion using amiodarone.  Evaluation at [facility] 10/15/2020 for rapid A-fib.  Cardioverted in the ER due to hypotension.  Echo 01/12/2020 showed improved EF 50 to 55%, just mild LAE, MR and AR.  She was hospitalized 04/03/2021 with sudden onset chest pain and palpitations and found to have wide-complex tachycardia with HR in the 170s to 180s.  This was unresponsive to Cardizem.  She was cardioverted with restoration of NSR.  She reverted to [facility] again and was started on amiodarone.  Was taken to EP study and ablation 04/25/2021 which induced a narrow complex tachycardia which terminated with adenosine.  Ablation was performed for ORT at the floor of the coronary sinus. Last ov with Dr. Curt Bears 09/13/21 with no further episodes of SVT and advisement to follow-up in 6 months.   PCP sent message to [name] for advisement following his office visit with patient on 02/26/22 with BNP >3500, worsening dyspnea, and LE edema.  She was advised to resume Lasix 40 mg daily and add spironolactone 25 mg daily.  Urgent office visit was  scheduled for 02/28/2022 with DOD, Dr. Irish Lack.  He advised her to increase her Lasix to 40 mg twice daily for 2 days then 40 mg once daily.  Lab work revealed stable kidney function and she was advised to take 40 to 80 mg of Lasix daily based on swelling.  When contacted.  She reported no improvement in shortness of breath.  She had no weight gain.  Echocardiogram revealed LVEF 50 to 55%, mild LVH, G2 DD, moderately elevated pulmonary artery systolic pressure mild to moderate MR, moderate TR. she was seen again in our office on 03/08/2022 by Dr. Ali Lowe.  CXR was unremarkable.  She was feeling some improvement on twice daily Lasix.  She was wearing cardiac monitor, EKG demonstrated NSR.  BNP was 6969 and she was advised to continue Lasix 40 mg twice daily.  She was started on Jardiance 10 mg daily.  Kidney function remained stable on labs 03/15/22.   Today, she is here   Past Medical History:  Diagnosis Date   A-fib ([place])    Anemia    Anxiety    Bipolar disorder ([place])    Blood transfusion 1991   autologous pts own blood given    CAD (coronary artery disease)    Cataract    Chest pain    "@ rest, lying down, w/exertion"   CHF (congestive heart failure) ([place]) 07/2020   Chronic back pain    "mostly lower back but I do have upper back pain regularly" (04/06/2018)   COPD (chronic obstructive pulmonary disease) ([place])  Depression    Esophageal stricture    Fibromyalgia    GERD (gastroesophageal reflux disease)    Heart murmur    "slight" (04/06/2018)   Hiatal hernia    Hyperlipidemia    Patient denies   Hypertension    Internal hemorrhoids    Lumbago    Mitral regurgitation    Osteoarthritis    Osteoporosis    Pneumonia ~ 01/2018   PONV (postoperative nausea and vomiting)    severe ponv, "in the past" (04/06/2018)   Rectal bleeding    Rheumatoid arthritis ([place])    Spondylosis    TIA (transient ischemic attack) 2013   Urinary incontinence    wears depends    Past Surgical History:   Procedure Laterality Date   ABDOMINAL HYSTERECTOMY  1982   BACK SURGERY     BALLOON DILATION N/A 09/21/2018   Procedure: BALLOON DILATION;  Surgeon: Irene Shipper, MD;  Location: [facility] ENDOSCOPY;  Service: Endoscopy;  Laterality: N/A;   BALLOON DILATION N/A 08/12/2019   Procedure: BALLOON DILATION;  Surgeon: Jackquline Denmark, MD;  Location[facility] ENDOSCOPY;  Service: Endoscopy;  Laterality: N/A;   BIOPSY  08/12/2019   Procedure: BIOPSY;  Surgeon: Jackquline Denmark, MD;  Location: [place];  Service: Endoscopy;;   BLADDER SUSPENSION  1980's   BOTOX INJECTION N/A 09/21/2018   Procedure: BOTOX INJECTION;  Surgeon: Irene Shipper, MD;  Location: [facility]OS[facility];  Service: Endoscopy;  Laterality: N/A;   BOTOX INJECTION N/A 08/12/2019   Procedure: BOTOX INJECTION;  Surgeon: Jackquline Denmark, MD;  Location: [facility] ENDOSCOPY;  Service: Endoscopy;  Laterality: N/A;   CATARACT EXTRACTION W/ INTRAOCULAR LENS  IMPLANT, BILATERAL Bilateral 2010   DILATION AND CURETTAGE OF UTERUS  1961   ESOPHAGEAL MANOMETRY N/A 03/25/2018   Procedure: ESOPHAGEAL MANOMETRY (EM);  Surgeon: Mauri Pole, MD;  Location: [facility] ENDOSCOPY;  Service: Endoscopy;  Laterality: N/A;   ESOPHAGOGASTRODUODENOSCOPY (EGD) WITH PROPOFOL N/A 04/07/2018   Procedure: ESOPHAGOGASTRODUODENOSCOPY (EGD) WITH PROPOFOL;  Surgeon: Doran Stabler, MD;  Location: [place];  Service: Gastroenterology;  Laterality: N/A;   ESOPHAGOGASTRODUODENOSCOPY (EGD) WITH PROPOFOL N/A 09/21/2018   Procedure: ESOPHAGOGASTRODUODENOSCOPY (EGD) WITH PROPOFOL;  Surgeon: Irene Shipper, MD;  Location: [facility];  Service: Endoscopy;  Laterality: N/A;   ESOPHAGOGASTRODUODENOSCOPY (EGD) WITH PROPOFOL N/A 08/12/2019   Procedure: ESOPHAGOGASTRODUODENOSCOPY (EGD) WITH PROPOFOL;  Surgeon: Jackquline Denmark, MD;  Location: [facility] ENDOSCOPY;  Service: Endoscopy;  Laterality: N/A;   HERNIA REPAIR     HIATAL HERNIA REPAIR N/A 08/02/2016   Procedure: LAPAROSCOPIC REPAIR OF LARGE  HIATAL HERNIA;   Surgeon: Excell Seltzer, MD;  Location: [facility] ORS;  Service: General;  Laterality: N/A;   JOINT REPLACEMENT     LAPAROSCOPIC NISSEN FUNDOPLICATION N/A [id number]   Procedure: LAPAROSCOPIC NISSEN FUNDOPLICATION;  Surgeon: Excell Seltzer, MD;  Location: [facility] ORS;  Service: General;  Laterality: N/A;   [place]; [id number]   "I've got 2 stainless steel rods; 6 screws; 2 ray cages"took bone from right hip to put in back   RIGHT/LEFT HEART CATH AND CORONARY ANGIOGRAPHY N/A 07/26/2019   Procedure: RIGHT/LEFT HEART CATH AND CORONARY ANGIOGRAPHY;  Surgeon: Belva Crome, MD;  Location: [place] CV LAB;  Service: Cardiovascular;  Laterality: N/A;   SVT ABLATION N/A 04/25/2021   Procedure: SVT ABLATION;  Surgeon: Constance Haw, MD;  Location: [place] CV LAB;  Service: Cardiovascular;  Laterality: N/A;   TEE WITHOUT CARDIOVERSION N/A 08/19/2019   Procedure: TRANSESOPHAGEAL ECHOCARDIOGRAM (TEE);  Surgeon: Stanford Breed,  Denice Bors, MD;  Location: [facility] ENDOSCOPY;  Service: Cardiovascular;  Laterality: N/A;   [place] Right ~ [place]  ~ 1976    Current Medications: No outpatient medications have been marked as taking for the 03/21/22 encounter (Appointment) with Ann Maki, Lanice Schwab, NP.     Allergies:   Tape, Tramadol, and Morphine   Social History   Socioeconomic History   Marital status: Widowed    Spouse name: Not on file   Number of children: 2   Years of education: Not on file   Highest education level: 12th grade  Occupational History   Occupation: retired    Fish farm manager: RETIRED  Tobacco Use   Smoking status: Never   Smokeless tobacco: Never  Vaping Use   Vaping Use: Never used  Substance and Sexual Activity   Alcohol use: Never   Drug use: Never   Sexual activity: Not Currently    Comment: 1st intercourse 70 yo-1 partner  Other Topics Concern   Not on file  Social History Narrative    Retired    Widowed   Social Determinants of Radio broadcast assistant Strain: Low Risk    Difficulty of Paying Living Expenses: Not hard at all  Food Insecurity: No Food Insecurity   Worried About Charity fundraiser in the Last Year: Never true   Arboriculturist in the Last Year: Never true  Transportation Needs: No Transportation Needs   Lack of Transportation (Medical): No   Lack of Transportation (Non-Medical): No  Physical Activity: Inactive   Days of Exercise per Week: 0 days   Minutes of Exercise per Session: 0 min  Stress: No Stress Concern Present   Feeling of Stress : Not at all  Social Connections: Moderately Integrated   Frequency of Communication with Friends and Family: More than three times a week   Frequency of Social Gatherings with Friends and Family: More than three times a week   Attends Religious Services: More than 4 times per year   Active Member of [employer] or Organizations: Yes   Attends Archivist Meetings: More than 4 times per year   Marital Status: Widowed     Family History: The patient's ***family history includes Heart attack in her brother; Heart disease in her brother; Heart disease (age of onset: 53) in her father; Hypertension in her mother; Kidney disease in her mother; Suicidality in her son. There is no history of Colon cancer, Esophageal cancer, Pancreatic cancer, Rectal cancer, or Stomach cancer.  ROS:   Please see the history of present illness.    *** All other systems reviewed and are negative.  Labs/Other Studies Reviewed:    The following studies were reviewed today:  Prior CV studies:  CXR 03/09/22   IMPRESSION: 1. Unchanged mild cardiomegaly. 2. Emphysema.    Echo 03/15/22  1. Left ventricular ejection fraction, by estimation, is 50 to 55%. The  left ventricle has low normal function. The left ventricle has no regional  wall motion abnormalities. There is mild left ventricular hypertrophy.  Left ventricular  diastolic  parameters are consistent with Grade II diastolic dysfunction  (pseudonormalization). Elevated left atrial pressure.   2. Right ventricular systolic function is normal. The right ventricular  size is mildly enlarged. There is moderately elevated pulmonary artery  systolic pressure. The estimated right ventricular systolic pressure is  99991111 mmHg.   3. Left atrial size was severely dilated.  4. Right atrial size was mildly dilated.   5. The mitral valve is degenerative. Mild to moderate mitral valve  regurgitation. No evidence of mitral stenosis. Moderate mitral annular  calcification.   6. Tricuspid valve regurgitation is moderate.   7. The aortic valve is tricuspid. Aortic valve regurgitation is mild.  Aortic valve sclerosis is present, with no evidence of aortic valve  stenosis.   8. The inferior vena cava is normal in size with greater than 50%  respiratory variability, suggesting right atrial pressure of 3 mmHg.     TTE 2022 demonstrates an ejection fraction of 60 to 65% with mild left ventricular hypertrophy and no significant mitral regurgitation or aortic valvular disease.  Recent Labs: 04/04/2021: Magnesium 2.0; TSH 1.588 02/18/2022: ALT 19; B Natriuretic Peptide 1,547.1 02/26/2022: Hemoglobin 9.5; Platelets 296.0 03/08/2022: NT-Pro BNP 6,969 03/15/2022: BUN 20; Creatinine, Ser 0.96; Potassium 4.5; Sodium 137  Recent Lipid Panel    Component Value Date/Time   CHOL 172 03/09/2020 0936   TRIG 111.0 03/09/2020 0936   HDL 65.60 03/09/2020 0936   CHOLHDL 3 03/09/2020 0936   VLDL 22.2 03/09/2020 0936   LDLCALC 84 03/09/2020 0936   LDLDIRECT 122.2 06/01/2013 0944     Risk Assessment/Calculations:   {Does this patient have ATRIAL FIBRILLATION?:[id number]}       Physical Exam:    VS:  There were no vitals taken for this visit.    Wt Readings from Last 3 Encounters:  03/08/22 142 lb 8 oz (64.6 kg)  02/28/22 142 lb 3.2 oz (64.5 kg)  02/14/22 158 lb (71.7 kg)      GEN: *** Well nourished, well developed in no acute distress HEENT: Normal NECK: No JVD; No carotid bruits CARDIAC: ***RRR, no murmurs, rubs, gallops RESPIRATORY:  Clear to auscultation without rales, wheezing or rhonchi  ABDOMEN: Soft, non-tender, non-distended MUSCULOSKELETAL:  No edema; No deformity. *** pedal pulses, ***bilaterally SKIN: Warm and dry NEUROLOGIC:  Alert and oriented x 3 PSYCHIATRIC:  Normal affect   EKG:  EKG is *** ordered today.  The ekg ordered today demonstrates ***  Diagnoses:    No diagnosis found. Assessment and Plan:     Acute on chronic HFpEF: PAF SVT COPD   {Are you ordering a CV Procedure (e.g. stress test, cath, DCCV, TEE, etc)?   Press F2        :[id number]    Medication Adjustments/Labs and Tests Ordered: Current medicines are reviewed at length with the patient today.  Concerns regarding medicines are outlined above.  No orders of the defined types were placed in this encounter.  No orders of the defined types were placed in this encounter.   There are no Patient Instructions on file for this visit.   Signed, Emmaline Life, NP  03/20/2022 5:08 PM    [place]

## 2022-03-21 ENCOUNTER — Ambulatory Visit: Payer: Medicare Other | Admitting: Nurse Practitioner

## 2022-03-25 DIAGNOSIS — J449 Chronic obstructive pulmonary disease, unspecified: Secondary | ICD-10-CM | POA: Diagnosis not present

## 2022-03-25 DIAGNOSIS — J9611 Chronic respiratory failure with hypoxia: Secondary | ICD-10-CM | POA: Diagnosis not present

## 2022-03-28 ENCOUNTER — Ambulatory Visit: Payer: Medicare Other | Admitting: Physician Assistant

## 2022-04-03 NOTE — Progress Notes (Unsigned)
Cardiology Office Note Date:  04/04/2022  Patient ID:  NINEVEH Julie Rice, DOB Apr 29, 1936, MRN [id number] PCP:  [name], NP  Cardiologist:  [name] Electrophysiologist: Dr. Curt Bears    Chief Complaint: follow up on monitor result  History of Present Illness: Julie Rice is a 86 y.o. female with history of fibromyalgia, chronic CHF (systolic with recovered LVEF > diastolic), HTN, AFib, TIA, COPD, desaturations with ambulation on O2 and follows with pulmonary, SVT, RA/OA, urinary incontinence but denies UTIs, large hiatal hernia s/p fundoplication, chronic dysphagia, scoliosis  She presented to the hospital 04/03/2021 with sudden onset chest pain and palpitation.  She was found to be in a wide-complex tachycardia with heart rates in the 170s to 180s.  This was unresponsive to Cardizem.  She was cardioverted with restoration of sinus rhythm.  She reverted to wide-complex tachycardia again and was started on amiodarone.  She was taken for EP study and ablation 04/25/2021 which induced a narrow complex tachycardia which terminated with adenosine.  Ablation was performed for ORT at the floor of the coronary sinus.  She comes in today to be seen for Dr. Curt Bears, last seen by him Nov 2022, she was post ablation, no recurrent tachycardia.  No changes were made.  She saw Dr. Irish Lack 02/28/22, c/o SOB and edema, noted dietary indiscretions with frozen dinners.  Using her lasix QOD.  Advised to reduce sodium, her lasix increased, aldactone had been recently added. There was mention of perhaps prior junctional rhythm on a prior EKG, she was not bradycardic and planned for an EKG when she came back in for labs  EKG done by RN and reviewed by DOD, described as having a lot of ectopy and planned for monitoring.  Phone calls with ongoing DOE, planned for labs, including BNP and a CXR to be worked in to see DOD  She saw Dr. Ali Lowe 03/08/22, continued BID lasix, started jardiance, planned for the  labs/CXR, low suspicion of pneumonia or marked effusion. Pending an already scheduled echo  LVEF 50-55%, no WMA, grade II DD, RVSP 57.74mmHg, mild-mod MR, mod TR Monitor noted SR, avg HR 65, 14 runs SVT longest 2hrs27min at 128 Symptom events associated with SVTs and PVCs  CXR with emphysema, no significant pulmonary congestion BMET ok, BNP 6969  I reviewed her EKGs 02/18/22, baseline artifact though looks like SB 57bpm with a short PR 03/05/22, SB 55bpm, PVCs with 2 morphologies, one couplet 03/08/22, SR 71bpm  TODAY She is accompanied by her daughter in law today. Feels generally about the same, but swelling is much improved if not resolved. She dates her change/increase in [place], generalized fatigue, worse sine about Dec 2022 She has a fairly small house and seems like she has to stop to catch her breath milling about the house.  Weak, having to hold on to the furniture to steady herself. She is not feeling lightheaded but tired, weak. Worse in the mornings arms/legs are achy, she has been told she has "all of the arthritis", definitely RA and OA.   She is known to have chronic lung disease, wears O2 at night, not daytime, follows with Dr. Melvyn Novas. CXR noted emphysema She also has significant scoliosis that probably contributes to some of her SOB  She had volume OL by her visits with Drs Irish Lack and Ali Lowe as well.  In regards to her heart rhythm, she has never felt her tachycardia again since her ablation.  Says she felt awful with that and definitely has not happened  again. She has an infrequent sense of a slight flutter now and again only and this is momentary  No CP No near syncope or syncope.  Device information Amiodarone started Oct 2020 >> stopped 06/19/21 post ablation   Past Medical History:  Diagnosis Date   A-fib ([place])    Anemia    Anxiety    Bipolar disorder ([place])    Blood transfusion 1991   autologous pts own blood given    CAD (coronary artery disease)     Cataract    Chest pain    "@ rest, lying down, w/exertion"   CHF (congestive heart failure) ([place]) 07/2020   Chronic back pain    "mostly lower back but I do have upper back pain regularly" (04/06/2018)   COPD (chronic obstructive pulmonary disease) ([facility])    Depression    Esophageal stricture    Fibromyalgia    GERD (gastroesophageal reflux disease)    Heart murmur    "slight" (04/06/2018)   Hiatal hernia    Hyperlipidemia    Patient denies   Hypertension    Internal hemorrhoids    Lumbago    Mitral regurgitation    Osteoarthritis    Osteoporosis    Pneumonia ~ 01/2018   PONV (postoperative nausea and vomiting)    severe ponv, "in the past" (04/06/2018)   Rectal bleeding    Rheumatoid arthritis ([place])    Spondylosis    TIA (transient ischemic attack) 2013   Urinary incontinence    wears depends    Past Surgical History:  Procedure Laterality Date   ABDOMINAL HYSTERECTOMY  1982   BACK SURGERY     BALLOON DILATION N/A 09/21/2018   Procedure: BALLOON DILATION;  Surgeon: Irene Shipper, MD;  Location: [facility] END[facility];  Service: Endoscopy;  Laterality: N/A;   BALLOON DILATION N/A 08/12/2019   Procedure: BALLOON DILATION;  Surgeon: Jackquline Denmark, MD;  Location: [facility] ENDOSCOPY;  Service: Endoscopy;  Laterality: N/A;   BIOPSY  08/12/2019   Procedure: BIOPSY;  Surgeon: Jackquline Denmark, MD;  Location: [facility];  Service: Endoscopy;;   BLADDER SUSPENSION  1980's   BOTOX INJECTION N/A 09/21/2018   Procedure: BOTOX INJECTION;  Surgeon: Irene Shipper, MD;  Location: [facility]OS[facility];  Service: Endoscopy;  Laterality: N/A;   BOTOX INJECTION N/A 08/12/2019   Procedure: BOTOX INJECTION;  Surgeon: Jackquline Denmark, MD;  Location: [facility] ENDOSCOPY;  Service: Endoscopy;  Laterality: N/A;   CATARACT EXTRACTION W/ INTRAOCULAR LENS  IMPLANT, BILATERAL Bilateral 2010   DILATION AND CURETTAGE OF UTERUS  1961   ESOPHAGEAL MANOMETRY N/A 03/25/2018   Procedure: ESOPHAGEAL MANOMETRY (EM);  Surgeon: Mauri Pole,  MD;  Location: [facility];  Service: Endoscopy;  Laterality: N/A;   ESOPHAGOGASTRODUODENOSCOPY (EGD) WITH PROPOFOL N/A 04/07/2018   Procedure: ESOPHAGOGASTRODUODENOSCOPY (EGD) WITH PROPOFOL;  Surgeon: Doran Stabler, MD;  Location: [facility];  Service: Gastroenterology;  Laterality: N/A;   ESOPHAGOGASTRODUODENOSCOPY (EGD) WITH PROPOFOL N/A 09/21/2018   Procedure: ESOPHAGOGASTRODUODENOSCOPY (EGD) WITH PROPOFOL;  Surgeon: Irene Shipper, MD;  [facility]: [facility];  Service: Endoscopy;  Laterality: N/A;   ESOPHAGOGASTRODUODENOSCOPY (EGD) WITH PROPOFOL N/A 08/12/2019   Procedure: ESOPHAGOGASTRODUODENOSCOPY (EGD) WITH PROPOFOL;  Surgeon: Jackquline Denmark, MD;  Location: [facility] ENDOSCOPY;  Service: Endoscopy;  Laterality: N/A;   HERNIA REPAIR     HIATAL HERNIA REPAIR N/A 08/02/2016   Procedure: LAPAROSCOPIC REPAIR OF LARGE  HIATAL HERNIA;  Surgeon: Excell Seltzer, MD;  Location: [facility] ORS;  Service: General;  Laterality: N/A;   JOINT REPLACEMENT  LAPAROSCOPIC NISSEN FUNDOPLICATION N/A 08/02/2016   Procedure: LAPAROSCOPIC NISSEN FUNDOPLICATION;  Surgeon: Glenna Fellows, MD;  Location: [facility] ORS;  Service: General;  Laterality: N/A;   LUMBAR LAMINECTOMY  1990; 1994; [id number]   "I've got 2 stainless steel rods; 6 screws; 2 ray cages"took bone from right hip to put in back   RIGHT/LEFT HEART CATH AND CORONARY ANGIOGRAPHY N/A 07/26/2019   Procedure: RIGHT/LEFT HEART CATH AND CORONARY ANGIOGRAPHY;  Surgeon: Lyn Records, MD;  Location: [facility] INVASIVE CV LAB;  Service: Cardiovascular;  Laterality: N/A;   SVT ABLATION N/A 04/25/2021   Procedure: SVT ABLATION;  Surgeon: Regan Lemming, MD;  Location: [facility] CV LAB;  Service: Cardiovascular;  Laterality: N/A;   TEE WITHOUT CARDIOVERSION N/A 08/19/2019   Procedure: TRANSESOPHAGEAL ECHOCARDIOGRAM (TEE);  Surgeon: Lewayne Bunting, MD;  Location: [facility] ENDOSCOPY;  Service: Cardiovascular;  Laterality: N/A;   TONSILLECTOMY AND ADENOIDECTOMY  1945   TOTAL  KNEE ARTHROPLASTY Right ~ 1996   TUBAL LIGATION  ~ 1976    Current Outpatient Medications  Medication Sig Dispense Refill   acetaminophen (TYLENOL) 500 MG tablet Take 1,000 mg by mouth every 6 (six) hours as needed for moderate pain, headache or fever.     albuterol (VENTOLIN HFA) 108 (90 Base) MCG/ACT inhaler TAKE 2 PUFFS BY MOUTH EVERY 6 HOURS AS NEEDED FOR WHEEZE OR SHORTNESS OF BREATH 8.5 each 2   benzonatate (TESSALON PERLES) 100 MG capsule Take 1 capsule (100 mg total) by mouth 3 (three) times daily as needed. 20 capsule 0   buPROPion (WELLBUTRIN XL) 300 MG 24 hr tablet Take 1 tablet (300 mg total) by mouth daily. 90 tablet 1   Carboxymethylcellulose Sodium (REFRESH TEARS OP) Place 1 drop into both eyes 2 (two) times daily.     cetirizine (ZYRTEC) 10 MG tablet Take 10 mg by mouth daily as needed for allergies.     clonazePAM (KLONOPIN) 0.5 MG tablet TAKE 1 TABLET BY MOUTH TWICE A DAY AS NEEDED FOR ANXIETY 60 tablet 2   DULoxetine (CYMBALTA) 60 MG capsule TAKE 1 CAPSULE BY MOUTH EVERY DAY 90 capsule 3   ELIQUIS 5 MG TABS tablet TAKE 1 TABLET BY MOUTH TWICE A DAY 180 tablet 1   empagliflozin (JARDIANCE) 10 MG TABS tablet Take 1 tablet (10 mg total) by mouth daily before breakfast. 90 tablet 3   furosemide (LASIX) 40 MG tablet Take 1 tablet (40 mg total) by mouth 2 (two) times daily. 180 tablet 3   gabapentin (NEURONTIN) 300 MG capsule TAKE 1 CAPSULE BY MOUTH THREE TIMES A DAY (Patient taking differently: 300 mg at bedtime.) 270 capsule 1   metoprolol tartrate (LOPRESSOR) 50 MG tablet TAKE 1 TABLET BY MOUTH TWICE A DAY 180 tablet 0   Multiple Vitamins-Minerals (ONE-A-DAY WOMENS 50+) TABS Take 1 tablet by mouth daily.     omeprazole (PRILOSEC) 20 MG capsule TAKE 1 CAPSULE BY MOUTH EVERY DAY 90 capsule 3   potassium chloride SA (KLOR-CON M) 20 MEQ tablet Take 1 tablet (20 mEq total) by mouth daily. 30 tablet 3   sodium chloride (OCEAN) 0.65 % SOLN nasal spray Place 1 spray into both nostrils  as needed for congestion.     No current facility-administered medications for this visit.    Allergies:   Tape, Tramadol, and Morphine   Social History:  The patient  reports that she has never smoked. She has never used smokeless tobacco. She reports that she does not drink alcohol and does not use drugs.  Family History:  The patient's family history includes Heart attack in her brother; Heart disease in her brother; Heart disease (age of onset: 17) in her father; Hypertension in her mother; Kidney disease in her mother; Suicidality in her son.  ROS:  Please see the history of present illness.    All other systems are reviewed and otherwise negative.   PHYSICAL EXAM:  VS:  BP 116/68   Pulse 64   Ht 5' 2.5" (1.588 m)   Wt 142 lb (64.4 kg)   SpO2 97%   BMI 25.56 kg/m  BMI: Body mass index is 25.56 kg/m. Well nourished, well developed, in no acute distress HEENT: normocephalic, atraumatic Neck: no JVD, carotid bruits or masses Cardiac:  RRR; no significant murmurs, no rubs, or gallops Lungs:  CTA b/l, no wheezing, rhonchi or rales Abd: soft, nontender MS: arthritic deformities of her hands, age appropriate atrophy Ext: no edema, dependent rubor that resolves when standing up/ambulating Skin: warm and dry, no rash Neuro:  No gross deficits appreciated Psych: euthymic mood, full affect   EKG:  not done today  03/15/22: TTE  1. Left ventricular ejection fraction, by estimation, is 50 to 55%. The  left ventricle has low normal function. The left ventricle has no regional  wall motion abnormalities. There is mild left ventricular hypertrophy.  Left ventricular diastolic  parameters are consistent with Grade II diastolic dysfunction  (pseudonormalization). Elevated left atrial pressure.   2. Right ventricular systolic function is normal. The right ventricular  size is mildly enlarged. There is moderately elevated pulmonary artery  systolic pressure. The estimated right  ventricular systolic pressure is  99991111 mmHg.   3. Left atrial size was severely dilated.   4. Right atrial size was mildly dilated.   5. The mitral valve is degenerative. Mild to moderate mitral valve  regurgitation. No evidence of mitral stenosis. Moderate mitral annular  calcification.   6. Tricuspid valve regurgitation is moderate.   7. The aortic valve is tricuspid. Aortic valve regurgitation is mild.  Aortic valve sclerosis is present, with no evidence of aortic valve  stenosis.   8. The inferior vena cava is normal in size with greater than 50%  respiratory variability, suggesting right atrial pressure of 3 mmHg.   04/25/21: EPS/ablation CONCLUSIONS:  1. Sinus rhythm upon presentation.  2. The patient had orthodromic reciprocating tachycardia 3. Successful radiofrequency ablation of an accessory pathway at the floor of the coronary sinus 4. No inducible arrhythmias following ablation.  5. No early apparent complications.     TTE 04/04/21   1. Left ventricular ejection fraction, by estimation, is 60 to 65%. The  left ventricle has normal function. Left ventricular endocardial border  not optimally defined to evaluate regional wall motion. There is mild left  ventricular hypertrophy. Left  ventricular diastolic parameters are consistent with Grade I diastolic  dysfunction (impaired relaxation).   2. Right ventricular systolic function is normal. The right ventricular  size is normal. There is normal pulmonary artery systolic pressure. The  estimated right ventricular systolic pressure is 0000000 mmHg.   3. The mitral valve is degenerative. Trivial mitral valve regurgitation.  No evidence of mitral stenosis. Moderate mitral annular calcification.   4. The aortic valve is normal in structure. There is mild calcification  of the aortic valve. Aortic valve regurgitation is mild. No aortic  stenosis is present.   5. The inferior vena cava is normal in size with greater than 50%   respiratory variability, suggesting right atrial  pressure of 3 mmHg.  Right and left heart cath on 07/26/2019 showed only mild nonobstructive disease and low right heart pressures  Recent Labs: 02/18/2022: ALT 19; B Natriuretic Peptide 1,547.1 02/26/2022: Hemoglobin 9.5; Platelets 296.0 03/08/2022: NT-Pro BNP 6,969 03/15/2022: BUN 20; Creatinine, Ser 0.96; Potassium 4.5; Sodium 137  No results found for requested labs within last 8760 hours.   Estimated Creatinine Clearance: 38.9 mL/min (by C-G formula based on SCr of 0.96 mg/dL).   Wt Readings from Last 3 Encounters:  04/04/22 142 lb (64.4 kg)  03/08/22 142 lb 8 oz (64.6 kg)  02/28/22 142 lb 3.2 oz (64.5 kg)     Other studies reviewed: Additional studies/records reviewed today include: summarized above  ASSESSMENT AND PLAN:  SVT S/p ablation of an accessory pathway at the floor of the coronary sinus Long episode (s) SVT on her monitor   Her monitor is reviewed: She makes symptom episodes of SOB, pounding (+/-anxious) both with HR 60s AND 120's-130's SOB, pounding, lightheaded  w/SR 60's, PACs SVT rate 120's-140's, avg HR with the 2 hour episode 128, there was pt triggered symptoms with in this time frame  She has some degree of baseline SB by her EKGs here, no heart block or significant bradycardia We may have room for some more lopressor  On her EKGs, ? If there is a bit of a delta wave, her PR on some is quite short. We may revisit amio? I will review with Dr. Curt Bears No changes today  I do not think her SVT plays a role in her day-to-day SOB, fatigue.  She does not describe on/off, episodic symptoms.   ADDEND: Reviewed with Dr. Curt Bears, we will increase her lopressor to 75mg  BID Tommye Standard, PA-C   Paroxysmal Afib CHA2DS2Vasc is 7, on Eliquis, appropriately dosed None on her monitor  Recent acute/chronic diastolic HF exacerbation Her weight is unchanged but edema is resolved, lungs are clear Still with  DOE Ambulating pulse Ox today was 98% on RA I think her SOB/SOE is probably multifactorial and will defer to Dr. Elissa Hefty management. Will get a BMET today now a few weeks on increased lasix   She mentions to me today that with the recent adjustments/added medicines that she is getting some pill burden tolerance, really does not want to think about more pills every day     Disposition: F/u with cardiology team in a month, EP in 2 mo, sooner if needed  Current medicines are reviewed at length with the patient today.  The patient did not have any concerns regarding medicines.  Venetia Night, PA-C 04/04/2022 1:05 PM     [place] [phone] (office)  [phone] (fax)

## 2022-04-04 ENCOUNTER — Telehealth: Payer: Self-pay | Admitting: *Deleted

## 2022-04-04 ENCOUNTER — Encounter: Payer: Self-pay | Admitting: Physician Assistant

## 2022-04-04 ENCOUNTER — Ambulatory Visit: Payer: Medicare Other | Admitting: Physician Assistant

## 2022-04-04 VITALS — BP 116/68 | HR 64 | Ht 62.5 in | Wt 142.0 lb

## 2022-04-04 DIAGNOSIS — I5033 Acute on chronic diastolic (congestive) heart failure: Secondary | ICD-10-CM | POA: Diagnosis not present

## 2022-04-04 DIAGNOSIS — I480 Paroxysmal atrial fibrillation: Secondary | ICD-10-CM | POA: Diagnosis not present

## 2022-04-04 DIAGNOSIS — I471 Supraventricular tachycardia: Secondary | ICD-10-CM

## 2022-04-04 DIAGNOSIS — Z79899 Other long term (current) drug therapy: Secondary | ICD-10-CM | POA: Diagnosis not present

## 2022-04-04 MED ORDER — METOPROLOL TARTRATE 50 MG PO TABS: 75.0000 mg | ORAL_TABLET | Freq: Two times a day (BID) | ORAL | 1 refills | Status: DC

## 2022-04-04 NOTE — Addendum Note (Signed)
Addended by: Oleta Mouse on: 04/04/2022 04:28 PM   Modules accepted: Orders

## 2022-04-04 NOTE — Telephone Encounter (Signed)
-----   Message from [facility], [place] sent at 04/04/2022  1:42 PM EDT ----- Please let the patient know I reveiwed her chart/our visit with Dr. Elberta Fortis, he would like Julie Rice to increase her dose of metoprolol to 75mg  (1.5 tabs BID).  Please update and sned in a new prescription for her as well please.  Thanks

## 2022-04-04 NOTE — Patient Instructions (Signed)
Medication Instructions:   Your physician recommends that you continue on your current medications as directed. Please refer to the Current Medication list given to you today.   *If you need a refill on your cardiac medications before your next appointment, please call your pharmacy*   Lab Work: BMET     If you have labs (blood work) drawn today and your tests are completely normal, you will receive your results only by: MyChart Message (if you have MyChart) OR A paper copy in the mail If you have any lab test that is abnormal or we need to change your treatment, we will call you to review the results.   Testing/Procedures: NONE ORDERED  TODAY     Follow-Up: At [facility], you and your health needs are our priority.  As part of our continuing mission to provide you with exceptional heart care, we have created designated Provider Care Teams.  These Care Teams include your primary Cardiologist (physician) and Advanced Practice Providers (APPs -  Physician Assistants and Nurse Practitioners) who all work together to provide you with the care you need, when you need it.  We recommend signing up for the patient portal called "MyChart".  Sign up information is provided on this After Visit Summary.  MyChart is used to connect with patients for Virtual Visits (Telemedicine).  Patients are able to view lab/test results, encounter notes, upcoming appointments, etc.  Non-urgent messages can be sent to your provider as well.   To learn more about what you can do with MyChart, go to [email or web address].    Your next appointment:    4-6  week(s) with Dr. Nishan \\APP                                       AND            2 MONTHS WITH   The format for your next appointment:   In Person  Provider:   You may see Will Brynda Peon, MD or one of the following Advanced Practice Providers on your designated Care Team:   [name], [name] [place] "Casimiro Needle" Mardelle Matte, Lanna Poche    Other  Instructions   Important Information About Sugar

## 2022-04-04 NOTE — Telephone Encounter (Signed)
Spoke with patient and aware of medication change of metoprolol to 75mg  (1.5 tabs BID) and verbalized understanding. Her medication changes are updated in Epic and a new prescription  sent in to pharmacy

## 2022-04-05 ENCOUNTER — Telehealth: Payer: Self-pay | Admitting: *Deleted

## 2022-04-05 DIAGNOSIS — Z79899 Other long term (current) drug therapy: Secondary | ICD-10-CM

## 2022-04-05 DIAGNOSIS — I480 Paroxysmal atrial fibrillation: Secondary | ICD-10-CM

## 2022-04-05 LAB — BASIC METABOLIC PANEL
BUN/Creatinine Ratio: 22 (ref 12–28)
BUN: 26 mg/dL (ref 8–27)
CO2: 24 mmol/L (ref 20–29)
Calcium: 10.4 mg/dL — ABNORMAL HIGH (ref 8.7–10.3)
Chloride: 96 mmol/L (ref 96–106)
Creatinine, Ser: 1.17 mg/dL — ABNORMAL HIGH (ref 0.57–1.00)
Glucose: 100 mg/dL — ABNORMAL HIGH (ref 70–99)
Potassium: 5.7 mmol/L — ABNORMAL HIGH (ref 3.5–5.2)
Sodium: 137 mmol/L (ref 134–144)
eGFR: 46 mL/min/{1.73_m2} — ABNORMAL LOW (ref >59)

## 2022-04-05 NOTE — Telephone Encounter (Signed)
-----   Message from [facility] [place], [place] sent at 04/05/2022  8:11 AM EDT ----- Creat is up a bit, not unexpected with increased diuretics.  Her potassium is high.  AT some point notes reports she was taking spironolactone, though yesterday off her med list.  Can you please confirm that she is NOT taking spironolactone? If she is stop it an repeat BMET Monday If she is NOT taking it, stop the potassium and repeat BMET Monday

## 2022-04-05 NOTE — Telephone Encounter (Signed)
Spoke with patient aware of  results and recommendations and verbalized understanding. Patient is currently not taking Spironolactone.Patient will come in June 8 for repeat labs.

## 2022-04-11 ENCOUNTER — Other Ambulatory Visit: Payer: Medicare Other

## 2022-04-11 DIAGNOSIS — Z79899 Other long term (current) drug therapy: Secondary | ICD-10-CM | POA: Diagnosis not present

## 2022-04-12 ENCOUNTER — Other Ambulatory Visit: Payer: Self-pay | Admitting: Adult Health

## 2022-04-12 ENCOUNTER — Encounter: Payer: Self-pay | Admitting: Interventional Cardiology

## 2022-04-12 ENCOUNTER — Telehealth: Payer: Self-pay | Admitting: Internal Medicine

## 2022-04-12 DIAGNOSIS — Z760 Encounter for issue of repeat prescription: Secondary | ICD-10-CM

## 2022-04-12 LAB — BASIC METABOLIC PANEL
BUN/Creatinine Ratio: 21 (ref 12–28)
BUN: 22 mg/dL (ref 8–27)
CO2: 26 mmol/L (ref 20–29)
Calcium: 9.4 mg/dL (ref 8.7–10.3)
Chloride: 99 mmol/L (ref 96–106)
Creatinine, Ser: 1.06 mg/dL — ABNORMAL HIGH (ref 0.57–1.00)
Glucose: 84 mg/dL (ref 70–99)
Potassium: 5.3 mmol/L — ABNORMAL HIGH (ref 3.5–5.2)
Sodium: 136 mmol/L (ref 134–144)
eGFR: 52 mL/min/{1.73_m2} — ABNORMAL LOW (ref >59)

## 2022-04-12 NOTE — Telephone Encounter (Signed)
Julie Pigeon, PA-C  04/12/2022  3:31 PM EDT Back to Top    Potassium is better but still a bit high.  Please, as per last result note confirm her medicines.   Can you please confirm that she is NOT taking spironolactone? If she is stop it an repeat BMET Monday If she is NOT taking it, stop the potassium and repeat BMET 1 week    I spoke with patient and reviewed lab results with her. She is not taking potassium or spironolactone. Will send to [name] to see if follow up lab work needed

## 2022-04-12 NOTE — Telephone Encounter (Signed)
Error

## 2022-04-12 NOTE — Telephone Encounter (Signed)
Lvm for patient to call back clinic and leave a message or leave a message through Mychart.  Unable to leave message on home phone.

## 2022-04-12 NOTE — Telephone Encounter (Signed)
Patient states she is returning a call from [place], received a few minutes ago. May have been regarding lab results. Please advise.

## 2022-04-15 ENCOUNTER — Other Ambulatory Visit: Payer: Self-pay | Admitting: *Deleted

## 2022-04-15 DIAGNOSIS — Z79899 Other long term (current) drug therapy: Secondary | ICD-10-CM

## 2022-04-15 NOTE — Telephone Encounter (Signed)
Last refill per controlled substance database: 03/09/22 Last OV: 02/26/22 Next OV: none scheduled

## 2022-04-19 ENCOUNTER — Other Ambulatory Visit: Payer: Self-pay | Admitting: *Deleted

## 2022-04-19 ENCOUNTER — Other Ambulatory Visit: Payer: Medicare Other | Admitting: *Deleted

## 2022-04-19 DIAGNOSIS — Z79899 Other long term (current) drug therapy: Secondary | ICD-10-CM

## 2022-04-20 LAB — BASIC METABOLIC PANEL
BUN/Creatinine Ratio: 24 (ref 12–28)
BUN: 21 mg/dL (ref 8–27)
CO2: 26 mmol/L (ref 20–29)
Calcium: 9.5 mg/dL (ref 8.7–10.3)
Chloride: 98 mmol/L (ref 96–106)
Creatinine, Ser: 0.89 mg/dL (ref 0.57–1.00)
Glucose: 82 mg/dL (ref 70–99)
Potassium: 4.7 mmol/L (ref 3.5–5.2)
Sodium: 139 mmol/L (ref 134–144)
eGFR: 64 mL/min/{1.73_m2} (ref >59)

## 2022-04-20 LAB — SPECIMEN STATUS REPORT

## 2022-04-25 DIAGNOSIS — J9611 Chronic respiratory failure with hypoxia: Secondary | ICD-10-CM | POA: Diagnosis not present

## 2022-04-25 DIAGNOSIS — J449 Chronic obstructive pulmonary disease, unspecified: Secondary | ICD-10-CM | POA: Diagnosis not present

## 2022-05-08 ENCOUNTER — Other Ambulatory Visit: Payer: Self-pay | Admitting: Interventional Cardiology

## 2022-05-08 ENCOUNTER — Other Ambulatory Visit: Payer: Self-pay | Admitting: Adult Health

## 2022-05-08 ENCOUNTER — Other Ambulatory Visit: Payer: Self-pay | Admitting: Cardiology

## 2022-05-08 DIAGNOSIS — F339 Major depressive disorder, recurrent, unspecified: Secondary | ICD-10-CM

## 2022-05-08 NOTE — Telephone Encounter (Signed)
Prescription refill request for Eliquis received. Indication:Afib Last office visit:6/23 Scr:0.8 Age: 86 Weight:64.4 kg  Prescription refilled

## 2022-05-14 ENCOUNTER — Encounter: Payer: Self-pay | Admitting: Physician Assistant

## 2022-05-14 ENCOUNTER — Ambulatory Visit: Payer: Medicare Other | Admitting: Physician Assistant

## 2022-05-14 VITALS — BP 130/70 | HR 66 | Ht 62.5 in | Wt 146.0 lb

## 2022-05-14 DIAGNOSIS — I10 Essential (primary) hypertension: Secondary | ICD-10-CM

## 2022-05-14 DIAGNOSIS — J449 Chronic obstructive pulmonary disease, unspecified: Secondary | ICD-10-CM | POA: Diagnosis not present

## 2022-05-14 DIAGNOSIS — I5032 Chronic diastolic (congestive) heart failure: Secondary | ICD-10-CM | POA: Diagnosis not present

## 2022-05-14 DIAGNOSIS — I480 Paroxysmal atrial fibrillation: Secondary | ICD-10-CM | POA: Diagnosis not present

## 2022-05-14 DIAGNOSIS — R0609 Other forms of dyspnea: Secondary | ICD-10-CM

## 2022-05-14 DIAGNOSIS — D649 Anemia, unspecified: Secondary | ICD-10-CM

## 2022-05-14 DIAGNOSIS — I2510 Atherosclerotic heart disease of native coronary artery without angina pectoris: Secondary | ICD-10-CM | POA: Insufficient documentation

## 2022-05-14 DIAGNOSIS — I471 Supraventricular tachycardia: Secondary | ICD-10-CM | POA: Diagnosis not present

## 2022-05-14 NOTE — Patient Instructions (Addendum)
Medication Instructions:  Your physician recommends that you continue on your current medications as directed. Please refer to the Current Medication list given to you today.  *If you need a refill on your cardiac medications before your next appointment, please call your pharmacy*   Lab Work: TODAY:  BMET, PRO BNP, CBC, & TSH  If you have labs (blood work) drawn today and your tests are completely normal, you will receive your results only by: MyChart Message (if you have MyChart) OR A paper copy in the mail If you have any lab test that is abnormal or we need to change your treatment, we will call you to review the results.   Testing/Procedures: Your physician recommends that you have a CARDIAC PET SCAN.  See instructions below:  How to Prepare for Your Cardiac PET/CT Stress Test:  1. Please do not take these medications before your test:   Medications that may interfere with the cardiac pharmacological stress agent (ex. nitrates - including erectile dysfunction medications or beta-blockers) the day of the exam. (Erectile dysfunction medication should be held for at least 72 hrs prior to test) Theophylline containing medications for 12 hours. Dipyridamole 48 hours prior to the test. Your remaining medications may be taken with water.  2. Nothing to eat or drink, except water, 3 hours prior to arrival time.   NO caffeine/decaffeinated products, or chocolate 12 hours prior to arrival.  3. NO perfume, cologne or lotion  4. Total time is 1 to 2 hours; you may want to bring reading material for the waiting time.  5. Please report to Admitting at the [facility] Main Entrance 60 minutes early for your test.  [address] [place], [place] [address]  Diabetic Preparation:  Hold oral medications. You may take NPH and Lantus insulin. Do not take Humalog or Humulin R (Regular Insulin) the day of your test. Check blood sugars prior to leaving the house. If able to eat  breakfast prior to 3 hour fasting, you may take all medications, including your insulin, Do not worry if you miss your breakfast dose of insulin - start at your next meal.  IF YOU THINK YOU MAY BE PREGNANT, OR ARE NURSING PLEASE INFORM THE TECHNOLOGIST.  In preparation for your appointment, medication and supplies will be purchased.  Appointment availability is limited, so if you need to cancel or reschedule, please call the Radiology Department at [phone]  24 hours in advance to avoid a cancellation fee of $100.00  What to Expect After you Arrive:  Once you arrive and check in for your appointment, you will be taken to a preparation room within the Radiology Department.  A technologist or Nurse will obtain your medical history, verify that you are correctly prepped for the exam, and explain the procedure.  Afterwards,  an IV will be started in your arm and electrodes will be placed on your skin for EKG monitoring during the stress portion of the exam. Then you will be escorted to the PET/CT scanner.  There, staff will get you positioned on the scanner and obtain a blood pressure and EKG.  During the exam, you will continue to be connected to the EKG and blood pressure machines.  A small, safe amount of a radioactive tracer will be injected in your IV to obtain a series of pictures of your heart along with an injection of a stress agent.    After your Exam:  It is recommended that you eat a meal and drink  a caffeinated beverage to counter act any effects of the stress agent.  Drink plenty of fluids for the remainder of the day and urinate frequently for the first couple of hours after the exam.  Your doctor will inform you of your test results within 7-10 business days.  For questions about your test or how to prepare for your test, please call: Rockwell Alexandria, Cardiac Imaging Nurse Navigator  Larey Brick, Cardiac Imaging Nurse Navigator Office: [phone]       Follow-Up: At [facility], you and your health needs are our priority.  As part of our continuing mission to provide you with exceptional heart care, we have created designated Provider Care Teams.  These Care Teams include your primary Cardiologist (physician) and Advanced Practice Providers (APPs -  Physician Assistants and Nurse Practitioners) who all work together to provide you with the care you need, when you need it.  We recommend signing up for the patient portal called "MyChart".  Sign up information is provided on this After Visit Summary.  MyChart is used to connect with patients for Virtual Visits (Telemedicine).  Patients are able to view lab/test results, encounter notes, upcoming appointments, etc.  Non-urgent messages can be sent to your provider as well.   To learn more about what you can do with MyChart, go to [email or web address].    Your next appointment:   3 MONTHS   The format for your next appointment:   In Person  Provider:   Charlton Haws, MD           Other Instructions   Important Information About Sugar

## 2022-05-14 NOTE — Assessment & Plan Note (Signed)
She has had Hgb in the 9-10 range. This may be playing a role but is not the sole cause of her shortness of breath. I will repeat her CBC today. If her Hgb is lower, she will need further evaluation of her anemia.

## 2022-05-14 NOTE — Assessment & Plan Note (Signed)
S/p RFA in 2022. She had a monitor recently with recurrent prolonged episodes of Supraventricular Tachycardia. She is tolerating the higher dose of beta-blocker. F/u with Dr. Elberta Fortis as planned.

## 2022-05-14 NOTE — Progress Notes (Signed)
Cardiology Office Note:    Date:  05/14/2022   ID:  Julie Rice, DOB Mar 20, 1936, MRN [id number]  PCP:  Shirline Frees, NP  [facility] Providers Cardiologist:  Charlton Haws, MD Electrophysiologist:  Will [name], MD     Referring MD: Shirline Frees, NP   Chief Complaint:  F/u for CHF    Patient Profile: HFimpEF (heart failure with improved ejection fraction)  Echocardiogram 07/2019: EF 35-40 Echocardiogram 08/2019: EF 45-50 Echocardiogram 01/2020: EF 50-55 Echocardiogram 04/2021: EF 60-65 Echocardiogram 03/2022: EF 50-55 Paroxysmal atrial fibrillation  Supraventricular Tachycardia  Admx in 5/22 w Supraventricular Tachycardia/WCT >> [place] S/p RFA 04/2021 (Dr. Elberta Fortis) Monitor 03/2022: prolonged episodes of SVT Coronary artery disease  Mild non-obstructive CAD by cath in 2020 Hx of TIA Chronic Obstructive Pulmonary Disease Home O2 Hypertension  Hyperlipidemia  Aortic atherosclerosis  Rheumatoid arthritis  Anemia GERD  Esophageal stricture w hx of dilation Hiatal hernia s/p Nissen fundoplication Bipolar disorder Fibromyalgia Chronic dysphagia Chronic urinary incontinence   Prior CV Studies: LONG TERM MONITOR(3-7 DAYS) HOOK UP AND INTERPRETATION 03/20/2022  Patch wear time was 6 days and 23 hours  Predominant rhythm was NSR with average HR 65bpm  14 runs of SVT with longest lasting 2 hours and at HR 128bpm  Occasional SVE (1.1%), rare VE (<1%)  Patient triggered events correlate with SVT and PVCs  No Afib or significant pauses seen  ECHO COMPLETE WO IMAGING ENHANCING AGENT 03/15/2022 EF 50-55, no RWMA, mild LVH, GRII DD, moderately elevated PASP (RVSP 57.5), severe LAE, mild RAE, mild to moderate MR, moderate TR, mild AI, AV sclerosis without stenosis  RIGHT/LEFT HEART CATH AND CORONARY ANGIOGRAPHY 07/26/2019  Widely patent left main  30 to 40% mid LAD  Widely patent circumflex  Luminal irregularities in a dominant right  coronary.  Right heart pressures are relatively low.  Pulmonary wedge pressure mean is 2 mmHg.  LVEDP 4 mmHg.       History of Present Illness:   Julie Rice is a 86 y.o. female with the above problem list.  She was evaluated in the emergency room in April with volume overload.  She had a follow-up with Dr. Eldridge Dace in April.  There was a question of junctional rhythm on her electrocardiogram in the emergency room and she was set up for a Zio patch monitor.  Her diuretics were adjusted for volume overload.  She had follow-up with Dr. Lynnette Caffey in May 2023.  Her diuretics were adjusted again for continued volume excess.  She was also placed on empagliflozin for heart failure treatment.  Her monitor did demonstrate prolonged episodes of SVT.  She was seen by [name], PA-C with EP 04/04/2022.  She reviewed this with Dr. Elberta Fortis and her metoprolol was adjusted.  Her creatinine and potassium have increased with diuresis and she has been taken off of spironolactone. Echocardiogram performed May 12 demonstrated normal EF, grade 2 diastolic dysfunction, mild to moderate mitral regurgitation and moderate pulmonary hypertension.  She has a follow-up with Dr. Elberta Fortis 8//23.  She returns for follow-up. She is here with her daughter. She notes continued dyspnea on exertion. This has been ongoing for a year now. She did have worsening shortness of breath when she went to the ED in April. She feels that it has only marginally improved since that time. She has not had orthopnea. She sleeps with O2 at night. She has chronic leg edema that is improved to her baseline. Her weight is down 12 lbs since April.  She has not had syncope. She has occ palpitations. She did have an episode with assoc chest burning 1 night last week. This is the symptom she has experience in the past with atrial fibrillation. It only lasted a few minutes.     Past Medical History:  Diagnosis Date   A-fib ([place])    Anemia    Anxiety     Bipolar disorder ([place])    Blood transfusion 1991   autologous pts own blood given    CAD (coronary artery disease)    Cataract    Chest pain    "@ rest, lying down, w/exertion"   CHF (congestive heart failure) ([place]) 07/2020   Chronic back pain    "mostly lower back but I do have upper back pain regularly" (04/06/2018)   COPD (chronic obstructive pulmonary disease) (H[facility])    Depression    Esophageal stricture    Fibromyalgia    GERD (gastroesophageal reflux disease)    Heart murmur    "slight" (04/06/2018)   Hiatal hernia    Hyperlipidemia    Patient denies   Hypertension    Internal hemorrhoids    Lumbago    Mitral regurgitation    Osteoarthritis    Osteoporosis    Pneumonia ~ 01/2018   PONV (postoperative nausea and vomiting)    severe ponv, "in the past" (04/06/2018)   Rectal bleeding    Rheumatoid arthritis ([place])    Spondylosis    TIA (transient ischemic attack) 2013   Urinary incontinence    wears depends   Current Medications: Current Meds  Medication Sig   acetaminophen (TYLENOL) 500 MG tablet Take 1,000 mg by mouth every 6 (six) hours as needed for moderate pain, headache or fever.   albuterol (VENTOLIN HFA) 108 (90 Base) MCG/ACT inhaler TAKE 2 PUFFS BY MOUTH EVERY 6 HOURS AS NEEDED FOR WHEEZE OR SHORTNESS OF BREATH   benzonatate (TESSALON PERLES) 100 MG capsule Take 1 capsule (100 mg total) by mouth 3 (three) times daily as needed.   buPROPion (WELLBUTRIN XL) 300 MG 24 hr tablet Take 1 tablet (300 mg total) by mouth daily.   Carboxymethylcellulose Sodium (REFRESH TEARS OP) Place 1 drop into both eyes 2 (two) times daily.   cetirizine (ZYRTEC) 10 MG tablet Take 10 mg by mouth daily as needed for allergies.   clonazePAM (KLONOPIN) 0.5 MG tablet TAKE 1 TABLET BY MOUTH TWICE A DAY AS NEEDED FOR ANXIETY   DULoxetine (CYMBALTA) 60 MG capsule TAKE 1 CAPSULE BY MOUTH EVERY DAY   ELIQUIS 5 MG TABS tablet TAKE 1 TABLET BY MOUTH TWICE A DAY   empagliflozin (JARDIANCE) 10 MG TABS  tablet Take 1 tablet (10 mg total) by mouth daily before breakfast.   furosemide (LASIX) 40 MG tablet Take 1 tablet (40 mg total) by mouth 2 (two) times daily.   metoprolol tartrate (LOPRESSOR) 50 MG tablet Take 1.5 tablets (75 mg total) by mouth 2 (two) times daily.   Multiple Vitamins-Minerals (ONE-A-DAY WOMENS 50+) TABS Take 1 tablet by mouth daily.   omeprazole (PRILOSEC) 20 MG capsule TAKE 1 CAPSULE BY MOUTH EVERY DAY   sodium chloride (OCEAN) 0.65 % SOLN nasal spray Place 1 spray into both nostrils as needed for congestion.    Allergies:   Tape, Tramadol, and Morphine   Social History   Tobacco Use   Smoking status: Never   Smokeless tobacco: Never  Vaping Use   Vaping Use: Never used  Substance Use Topics   Alcohol use: Never  Drug use: Never    Family Hx: The patient's family history includes Heart attack in her brother; Heart disease in her brother; Heart disease (age of onset: 3) in her father; Hypertension in her mother; Kidney disease in her mother; Suicidality in her son. There is no history of Colon cancer, Esophageal cancer, Pancreatic cancer, Rectal cancer, or Stomach cancer.  Review of Systems  Constitutional: Negative for fever.  Respiratory:  Positive for cough. Negative for sputum production.   Gastrointestinal:  Negative for hematochezia.  Genitourinary:  Negative for hematuria.     EKGs/Labs/Other Test Reviewed:    EKG:  EKG is  ordered today.  The ekg ordered today demonstrates n/a  Recent Labs: 02/18/2022: ALT 19; B Natriuretic Peptide 1,547.1 02/26/2022: Hemoglobin 9.5; Platelets 296.0 03/08/2022: NT-Pro BNP 6,969 04/19/2022: BUN 21; Creatinine, Ser 0.89; Potassium 4.7; Sodium 139   Recent Lipid Panel No results for input(s): "CHOL", "TRIG", "HDL", "VLDL", "LDLCALC", "LDLDIRECT" in the last 8760 hours.   Risk Assessment/Calculations/Metrics:    CHA2DS2-VASc Score = 8   This indicates a 10.8% annual risk of stroke. The patient's score is based  upon: CHF History: 1 HTN History: 1 Diabetes History: 0 Stroke History: 2 Vascular Disease History: 1 Age Score: 2 Gender Score: 1             Physical Exam:    VS:  BP 130/70   Pulse 66   Ht 5' 2.5" (1.588 m)   Wt 146 lb (66.2 kg)   SpO2 98%   BMI 26.28 kg/m     Wt Readings from Last 3 Encounters:  05/14/22 146 lb (66.2 kg)  04/04/22 142 lb (64.4 kg)  03/08/22 142 lb 8 oz (64.6 kg)    Constitutional:      Appearance: Healthy appearance. Not in distress.  Neck:     Thyroid: No thyromegaly.     Vascular: JVD normal.  Pulmonary:     Effort: Pulmonary effort is normal.     Breath sounds: No wheezing. No rales.  Cardiovascular:     Normal rate. Regular rhythm. Normal S1. Normal S2.      Murmurs: There is no murmur.  Edema:    Peripheral edema present.    Ankle: bilateral trace edema of the ankle. Abdominal:     Palpations: Abdomen is soft.  Skin:    General: Skin is warm and dry.  Neurological:     Mental Status: Alert and oriented to person, place and time.          ASSESSMENT & PLAN:   DOE (dyspnea on exertion) She notes dyspnea on exertion for the past year. She recently was seen for a/c CHF and her diuretics were adjusted. Echocardiogram in 03/2022 demonstrated low normal EF and mod diastolic dysfunction. The etiology of her shortness of breath is not entirely clear. The potential causes include CHF, COPD, restriction related to scoliosis, deconditioning or possibly ischemia. My suspicion is that her shortness of breath is multifactorial. She had mild non-obstructive CAD on cath in 2020. The likelihood she has developed high grade disease in that short time is low but I have recommended proceeding with stress testing to exclude ischemia as a cause. I will also recheck her labs and adjust diuretic Rx if needed.  BMET, BNP, CBC, TSH If BNP ?, increase furosemide or consider torsemide. Cardiac PET F/u 3 mos.   (HFpEF) heart failure with preserved ejection  fraction (HCC) She was recently diuresed after a trip to the ED for a/c CHF.  Her weight is down and her exam suggests stable volume status. However, she continues to feel short of breath. She could not afford Entresto in the past and recently had issues with ? K+ with spironolactone. She seems to be tolerating SGLT2i Rx.  Obtain f/u BMET, BNP and adjust diuretic dose if needed Continue Jardiance 10 mg once daily  F/u 3 mos.   PAF (paroxysmal atrial fibrillation) ([place]) Recent monitor with no evidence of AFib. She is in NSR on exam today. She may have had a brief episode of atrial fibrillation recently. However, I do not think that atrial fibrillation explains her shortness of breath. She is tolerating anticoagulation with Eliquis. Recent SCr 0.89 and weight 66 kg. Continue Eliquis 5 mg twice daily. BMET, CBC today.  Paroxysmal SVT (supraventricular tachycardia) ([place]) S/p RFA in 2022. She had a monitor recently with recurrent prolonged episodes of Supraventricular Tachycardia. She is tolerating the higher dose of beta-blocker. F/u with Dr. Curt Bears as planned.   Essential hypertension The patient's blood pressure is controlled on her current regimen.  Continue current therapy.    Coronary artery disease involving native coronary artery of native heart without angina pectoris She had mild non-obstructive CAD by cath in 2020. She has had some chest pain or tightness with her shortness of breath.she is not on ASA as she is on Eliquis. Proceed with Cardiac PET as noted.   UNSPECIFIED ANEMIA She has had Hgb in the 9-10 range. This may be playing a role but is not the sole cause of her shortness of breath. I will repeat her CBC today. If her Hgb is lower, she will need further evaluation of her anemia.         Shared Decision Making/Informed Consent The risks [chest pain, shortness of breath, cardiac arrhythmias, dizziness, blood pressure fluctuations, myocardial infarction, stroke/transient ischemic  attack, nausea, vomiting, allergic reaction, radiation exposure, metallic taste sensation and life-threatening complications (estimated to be 1 in 10,000)], benefits (risk stratification, diagnosing coronary artery disease, treatment guidance) and alternatives of a cardiac PET stress test were discussed in detail with Ms. Kramm and she agrees to proceed.   Dispo:  Return in about 3 months (around 08/14/2022) for Routine Follow Up w [name].   Medication Adjustments/Labs and Tests Ordered: Current medicines are reviewed at length with the patient today.  Concerns regarding medicines are outlined above.  Tests Ordered: Orders Placed This Encounter  Procedures   NM PET CT CARDIAC PERFUSION MULTI W/ABSOLUTE BLOODFLOW   Basic metabolic panel   Pro b natriuretic peptide (BNP)   CBC   TSH   Cardiac Stress Test: Informed Consent Details: Physician/Practitioner Attestation; Transcribe to consent form and obtain patient signature   Medication Changes: No orders of the defined types were placed in this encounter.  Signed, Richardson Dopp, PA-C  05/14/2022 3:44 PM    [address], [place], [place]  [address] Phone: [phone]; Fax: [phone]

## 2022-05-14 NOTE — Assessment & Plan Note (Signed)
Recent monitor with no evidence of AFib. She is in NSR on exam today. She may have had a brief episode of atrial fibrillation recently. However, I do not think that atrial fibrillation explains her shortness of breath. She is tolerating anticoagulation with Eliquis. Recent SCr 0.89 and weight 66 kg. Continue Eliquis 5 mg twice daily. BMET, CBC today.

## 2022-05-14 NOTE — Assessment & Plan Note (Signed)
She was recently diuresed after a trip to the ED for a/c CHF. Her weight is down and her exam suggests stable volume status. However, she continues to feel short of breath. She could not afford Entresto in the past and recently had issues with ? K+ with spironolactone. She seems to be tolerating SGLT2i Rx.   Obtain f/u BMET, BNP and adjust diuretic dose if needed  Continue Jardiance 10 mg once daily   F/u 3 mos.

## 2022-05-14 NOTE — Assessment & Plan Note (Signed)
She had mild non-obstructive CAD by cath in 2020. She has had some chest pain or tightness with her shortness of breath.she is not on ASA as she is on Eliquis. Proceed with Cardiac PET as noted.

## 2022-05-14 NOTE — Assessment & Plan Note (Signed)
The patient's blood pressure is controlled on her current regimen.  Continue current therapy.   

## 2022-05-14 NOTE — Assessment & Plan Note (Addendum)
She notes dyspnea on exertion for the past year. She recently was seen for a/c CHF and her diuretics were adjusted. Echocardiogram in 03/2022 demonstrated low normal EF and mod diastolic dysfunction. The etiology of her shortness of breath is not entirely clear. The potential causes include CHF, COPD, restriction related to scoliosis, deconditioning or possibly ischemia. My suspicion is that her shortness of breath is multifactorial. She had mild non-obstructive CAD on cath in 2020. The likelihood she has developed high grade disease in that short time is low but I have recommended proceeding with stress testing to exclude ischemia as a cause. I will also recheck her labs and adjust diuretic Rx if needed.   BMET, BNP, CBC, TSH  If BNP ?, increase furosemide or consider torsemide.  Cardiac PET  F/u 3 mos.

## 2022-05-15 LAB — BASIC METABOLIC PANEL
BUN/Creatinine Ratio: 24 (ref 12–28)
BUN: 22 mg/dL (ref 8–27)
CO2: 24 mmol/L (ref 20–29)
Calcium: 9.5 mg/dL (ref 8.7–10.3)
Chloride: 99 mmol/L (ref 96–106)
Creatinine, Ser: 0.9 mg/dL (ref 0.57–1.00)
Glucose: 98 mg/dL (ref 70–99)
Potassium: 4.6 mmol/L (ref 3.5–5.2)
Sodium: 139 mmol/L (ref 134–144)
eGFR: 63 mL/min/{1.73_m2} (ref >59)

## 2022-05-15 LAB — CBC
Hematocrit: 33.5 % — ABNORMAL LOW (ref 34.0–46.6)
Hemoglobin: 10.9 g/dL — ABNORMAL LOW (ref 11.1–15.9)
MCH: 28.5 pg (ref 26.6–33.0)
MCHC: 32.5 g/dL (ref 31.5–35.7)
MCV: 88 fL (ref 79–97)
Platelets: 334 10*3/uL (ref 150–450)
RBC: 3.82 x10E6/uL (ref 3.77–5.28)
RDW: 16.5 % — ABNORMAL HIGH (ref 11.7–15.4)
WBC: 10.5 10*3/uL (ref 3.4–10.8)

## 2022-05-15 LAB — TSH: TSH: 2.16 u[IU]/mL (ref 0.450–4.500)

## 2022-05-15 LAB — PRO B NATRIURETIC PEPTIDE: NT-Pro BNP: 2909 pg/mL — ABNORMAL HIGH (ref 0–738)

## 2022-05-17 ENCOUNTER — Other Ambulatory Visit: Payer: Medicare Other

## 2022-05-17 ENCOUNTER — Telehealth: Payer: Self-pay | Admitting: *Deleted

## 2022-05-17 DIAGNOSIS — Z79899 Other long term (current) drug therapy: Secondary | ICD-10-CM

## 2022-05-17 NOTE — Telephone Encounter (Signed)
-----   Message from Beatrice Lecher, [place] sent at 05/15/2022  5:01 PM EDT ----- BNP improved but still elevated.  Hemoglobin improved/stable.  Creatinine, K+, TSH normal. Med List indicates she is taking Lasix 40 mg twice daily (please confirm) PLAN:  -Increase furosemide to 60 mg twice daily -BMET, BNP 1 week Tereso Newcomer, PA-C    05/15/2022 4:56 PM

## 2022-05-24 ENCOUNTER — Other Ambulatory Visit: Payer: Medicare Other

## 2022-05-24 DIAGNOSIS — Z79899 Other long term (current) drug therapy: Secondary | ICD-10-CM

## 2022-05-25 DIAGNOSIS — J449 Chronic obstructive pulmonary disease, unspecified: Secondary | ICD-10-CM | POA: Diagnosis not present

## 2022-05-25 DIAGNOSIS — J9611 Chronic respiratory failure with hypoxia: Secondary | ICD-10-CM | POA: Diagnosis not present

## 2022-05-25 LAB — BASIC METABOLIC PANEL
BUN/Creatinine Ratio: 22 (ref 12–28)
BUN: 17 mg/dL (ref 8–27)
CO2: 28 mmol/L (ref 20–29)
Calcium: 8.9 mg/dL (ref 8.7–10.3)
Chloride: 99 mmol/L (ref 96–106)
Creatinine, Ser: 0.76 mg/dL (ref 0.57–1.00)
Glucose: 86 mg/dL (ref 70–99)
Potassium: 4.7 mmol/L (ref 3.5–5.2)
Sodium: 141 mmol/L (ref 134–144)
eGFR: 77 mL/min/{1.73_m2} (ref >59)

## 2022-05-25 LAB — PRO B NATRIURETIC PEPTIDE: NT-Pro BNP: 2900 pg/mL — ABNORMAL HIGH (ref 0–738)

## 2022-05-27 ENCOUNTER — Telehealth: Payer: Self-pay | Admitting: *Deleted

## 2022-05-27 DIAGNOSIS — Z79899 Other long term (current) drug therapy: Secondary | ICD-10-CM

## 2022-05-27 MED ORDER — FUROSEMIDE 40 MG PO TABS: 60.0000 mg | ORAL_TABLET | Freq: Two times a day (BID) | ORAL | 3 refills | Status: DC

## 2022-05-27 MED ORDER — FUROSEMIDE 40 MG PO TABS: 60.0000 mg | ORAL_TABLET | Freq: Every day | ORAL | 3 refills | Status: DC

## 2022-05-27 NOTE — Telephone Encounter (Signed)
-----   Message from Beatrice Lecher, PA-C sent at 05/27/2022  6:13 AM EDT ----- BNP still elevated. Creatinine, K+ normal.  PLAN:  -Increase Lasix to 60 mg twice daily  -BMET in 1 week -Ask patient if breathing is any better.  Tereso Newcomer, PA-C    05/27/2022 6:09 AM

## 2022-05-27 NOTE — Addendum Note (Signed)
Addended by: Burnetta Sabin on: 05/27/2022 09:43 AM   Modules accepted: Orders

## 2022-05-28 ENCOUNTER — Other Ambulatory Visit: Payer: Self-pay | Admitting: Interventional Cardiology

## 2022-05-28 ENCOUNTER — Other Ambulatory Visit: Payer: Self-pay | Admitting: Adult Health

## 2022-05-28 DIAGNOSIS — F339 Major depressive disorder, recurrent, unspecified: Secondary | ICD-10-CM

## 2022-05-31 ENCOUNTER — Ambulatory Visit (INDEPENDENT_AMBULATORY_CARE_PROVIDER_SITE_OTHER): Payer: Medicare Other | Admitting: Adult Health

## 2022-05-31 ENCOUNTER — Encounter: Payer: Self-pay | Admitting: Adult Health

## 2022-05-31 VITALS — BP 140/90 | HR 70 | Temp 97.6°F | Ht 62.5 in | Wt 144.0 lb

## 2022-05-31 DIAGNOSIS — G6289 Other specified polyneuropathies: Secondary | ICD-10-CM | POA: Diagnosis not present

## 2022-05-31 DIAGNOSIS — R251 Tremor, unspecified: Secondary | ICD-10-CM | POA: Diagnosis not present

## 2022-05-31 MED ORDER — PREGABALIN 50 MG PO CAPS: 50.0000 mg | ORAL_CAPSULE | Freq: Every day | ORAL | 0 refills | Status: DC

## 2022-05-31 NOTE — Patient Instructions (Signed)
It was great seeing you today   I am going to prescribe you a low dose of Lyrica - take this at night   I am also going to refer you to neurology for the tremors - they will call you to schedule

## 2022-05-31 NOTE — Progress Notes (Signed)
Subjective:    Patient ID: Julie Rice, female    DOB: Dec 19, 1935, 86 y.o.   MRN: [id number]  HPI 86 year old female who  has a past medical history of A-fib (HCC), Anemia, Anxiety, Bipolar disorder (HCC), Blood transfusion (1991), CAD (coronary artery disease), Cataract, Chest pain, CHF (congestive heart failure) (HCC) (07/2020), Chronic back pain, COPD (chronic obstructive pulmonary disease) (HCC), Depression, Esophageal stricture, Fibromyalgia, GERD (gastroesophageal reflux disease), Heart murmur, Hiatal hernia, Hyperlipidemia, Hypertension, Internal hemorrhoids, Lumbago, Mitral regurgitation, Osteoarthritis, Osteoporosis, Pneumonia (~ 01/2018), PONV (postoperative nausea and vomiting), Rectal bleeding, Rheumatoid arthritis (HCC), Spondylosis, TIA (transient ischemic attack) (2013), and Urinary incontinence.  She presents to the office today with her daughter in law for multiple issues.   Polyneuropathy -she was taking gabapentin 300 mg 3 times daily up until a month ago when it was discontinued.  She has noticed the improvement in her cognitive function since stopping this medication but unfortunately Celebrex alone is not helping her neuropathy especially in bilateral legs.  He reports that she is in pretty constant pain, seems to be worse in the morning.  She has been taking her Celebrex as directed and then applying over-the-counter muscle cream on her legs with little improvement.  She is wondering if there is anything else she can take  Tremors -she reports that over the last few weeks she has developed upper extremity tremors as well as a jerking sensation in all of her extremities.  The tremors in her hands seem to be more constant as the jerking motions seem to be intermittent.  Her daughter-in-law reports that she has had tremors years ago but they resolved.    Review of Systems See HPI   Past Medical History:  Diagnosis Date   A-fib (HCC)    Anemia    Anxiety    Bipolar  disorder (HCC)    Blood transfusion 1991   autologous pts own blood given    CAD (coronary artery disease)    Cataract    Chest pain    "@ rest, lying down, w/exertion"   CHF (congestive heart failure) (HCC) 07/2020   Chronic back pain    "mostly lower back but I do have upper back pain regularly" (04/06/2018)   COPD (chronic obstructive pulmonary disease) (HCC)    Depression    Esophageal stricture    Fibromyalgia    GERD (gastroesophageal reflux disease)    Heart murmur    "slight" (04/06/2018)   Hiatal hernia    Hyperlipidemia    Patient denies   Hypertension    Internal hemorrhoids    Lumbago    Mitral regurgitation    Osteoarthritis    Osteoporosis    Pneumonia ~ 01/2018   PONV (postoperative nausea and vomiting)    severe ponv, "in the past" (04/06/2018)   Rectal bleeding    Rheumatoid arthritis (HCC)    Spondylosis    TIA (transient ischemic attack) 2013   Urinary incontinence    wears depends    Social History   Socioeconomic History   Marital status: Widowed    Spouse name: Not on file   Number of children: 2   Years of education: Not on file   Highest education level: 12th grade  Occupational History   Occupation: retired    Associate Professor: RETIRED  Tobacco Use   Smoking status: Never   Smokeless tobacco: Never  Vaping Use   Vaping Use: Never used  Substance and Sexual Activity   Alcohol use: Never  Drug use: Never   Sexual activity: Not Currently    Comment: 1st intercourse 74 yo-1 partner  Other Topics Concern   Not on file  Social History Narrative   Retired    Widowed   Social Determinants of Health   Financial Resource Strain: Low Risk  (02/14/2022)   Overall Financial Resource Strain (CARDIA)    Difficulty of Paying Living Expenses: Not hard at all  Food Insecurity: No Food Insecurity (02/14/2022)   Hunger Vital Sign    Worried About Running Out of Food in the Last Year: Never true    Ran Out of Food in the Last Year: Never true   Transportation Needs: No Transportation Needs (02/14/2022)   PRAPARE - Administrator, Civil Service (Medical): No    Lack of Transportation (Non-Medical): No  Physical Activity: Inactive (02/14/2022)   Exercise Vital Sign    Days of Exercise per Week: 0 days    Minutes of Exercise per Session: 0 min  Stress: No Stress Concern Present (02/14/2022)   [employer] of Occupational Health - Occupational Stress Questionnaire    Feeling of Stress : Not at all  Social Connections: Moderately Integrated (02/14/2022)   Social Connection and Isolation Panel [NHANES]    Frequency of Communication with Friends and Family: More than three times a week    Frequency of Social Gatherings with Friends and Family: More than three times a week    Attends Religious Services: More than 4 times per year    Active Member of [employer] or Organizations: Yes    Attends Banker Meetings: More than 4 times per year    Marital Status: Widowed  Intimate Partner Violence: Not At Risk (02/14/2022)   Humiliation, Afraid, Rape, and Kick questionnaire    Fear of Current or Ex-Partner: No    Emotionally Abused: No    Physically Abused: No    Sexually Abused: No    Past Surgical History:  Procedure Laterality Date   ABDOMINAL HYSTERECTOMY  1982   BACK SURGERY     BALLOON DILATION N/A 09/21/2018   Procedure: BALLOON DILATION;  Surgeon: Hilarie Fredrickson, MD;  Location: [facility] ENDOSCOPY;  Service: Endoscopy;  Laterality: N/A;   BALLOON DILATION N/A 08/12/2019   Procedure: BALLOON DILATION;  Surgeon: Lynann Bologna, MD;  Location: [facility] ENDOSCOPY;  Service: Endoscopy;  Laterality: N/A;   BIOPSY  08/12/2019   Procedure: BIOPSY;  Surgeon: Lynann Bologna, MD;  Location: [facility] ENDOSCOPY;  Service: Endoscopy;;   BLADDER SUSPENSION  1980's   BOTOX INJECTION N/A 09/21/2018   Procedure: BOTOX INJECTION;  Surgeon: Hilarie Fredrickson, MD;  Location: [facility] ENDOSCOPY;  Service: Endoscopy;  Laterality: N/A;   BOTOX INJECTION N/A  08/12/2019   Procedure: BOTOX INJECTION;  Surgeon: Lynann Bologna, MD;  Location: [facility] ENDOSCOPY;  Service: Endoscopy;  Laterality: N/A;   CATARACT EXTRACTION W/ INTRAOCULAR LENS  IMPLANT, BILATERAL Bilateral 2010   DILATION AND CURETTAGE OF UTERUS  1961   ESOPHAGEAL MANOMETRY N/A 03/25/2018   Procedure: ESOPHAGEAL MANOMETRY (EM);  Surgeon: Napoleon Form, MD;  Location: [facility] ENDOSCOPY;  Service: Endoscopy;  Laterality: N/A;   ESOPHAGOGASTRODUODENOSCOPY (EGD) WITH PROPOFOL N/A 04/07/2018   Procedure: ESOPHAGOGASTRODUODENOSCOPY (EGD) WITH PROPOFOL;  Surgeon: Sherrilyn Rist, MD;  Location: [facility] ENDOSCOPY;  Service: Gastroenterology;  Laterality: N/A;   ESOPHAGOGASTRODUODENOSCOPY (EGD) WITH PROPOFOL N/A 09/21/2018   Procedure: ESOPHAGOGASTRODUODENOSCOPY (EGD) WITH PROPOFOL;  Surgeon: Hilarie Fredrickson, MD;  Location: [facility] ENDOSCOPY;  Service: Endoscopy;  Laterality: N/A;  ESOPHAGOGASTRODUODENOSCOPY (EGD) WITH PROPOFOL N/A 08/12/2019   Procedure: ESOPHAGOGASTRODUODENOSCOPY (EGD) WITH PROPOFOL;  Surgeon: Lynann Bologna, MD;  Location: [facility] ENDOSCOPY;  Service: Endoscopy;  Laterality: N/A;   HERNIA REPAIR     HIATAL HERNIA REPAIR N/A 08/02/2016   Procedure: LAPAROSCOPIC REPAIR OF LARGE  HIATAL HERNIA;  Surgeon: Glenna Fellows, MD;  Location: [facility] ORS;  Service: General;  Laterality: N/A;   JOINT REPLACEMENT     LAPAROSCOPIC NISSEN FUNDOPLICATION N/A 08/02/2016   Procedure: LAPAROSCOPIC NISSEN FUNDOPLICATION;  Surgeon: Glenna Fellows, MD;  Location: [facility] ORS;  Service: General;  Laterality: N/A;   LUMBAR LAMINECTOMY  1990; 1994; [id number]   "I've got 2 stainless steel rods; 6 screws; 2 ray cages"took bone from right hip to put in back   RIGHT/LEFT HEART CATH AND CORONARY ANGIOGRAPHY N/A 07/26/2019   Procedure: RIGHT/LEFT HEART CATH AND CORONARY ANGIOGRAPHY;  Surgeon: Lyn Records, MD;  Location: [facility] INVASIVE CV LAB;  Service: Cardiovascular;  Laterality: N/A;   SVT ABLATION N/A 04/25/2021   Procedure: SVT  ABLATION;  Surgeon: Regan Lemming, MD;  Location: [facility] INVASIVE CV LAB;  Service: Cardiovascular;  Laterality: N/A;   TEE WITHOUT CARDIOVERSION N/A 08/19/2019   Procedure: TRANSESOPHAGEAL ECHOCARDIOGRAM (TEE);  Surgeon: Lewayne Bunting, MD;  Location: [facility] ENDOSCOPY;  Service: Cardiovascular;  Laterality: N/A;   TONSILLECTOMY AND ADENOIDECTOMY  1945   TOTAL KNEE ARTHROPLASTY Right ~ 1996   TUBAL LIGATION  ~ 1976    Family History  Problem Relation Age of Onset   Kidney disease Mother    Hypertension Mother    Heart disease Father 79       die of MI at age 29   Suicidality Son    Heart disease Brother    Heart attack Brother    Colon cancer Neg Hx    Esophageal cancer Neg Hx    Pancreatic cancer Neg Hx    Rectal cancer Neg Hx    Stomach cancer Neg Hx     Allergies  Allergen Reactions   Tape Other (See Comments)    Band aides, adhesive tape Redness and pulls skin off   Tramadol Other (See Comments)    Pt has prolonged Qtc interval of 540- cannot give tramadol per pharmacy   Morphine Itching and Rash    Flushing    Current Outpatient Medications on File Prior to Visit  Medication Sig Dispense Refill   acetaminophen (TYLENOL) 500 MG tablet Take 1,000 mg by mouth every 6 (six) hours as needed for moderate pain, headache or fever.     albuterol (VENTOLIN HFA) 108 (90 Base) MCG/ACT inhaler TAKE 2 PUFFS BY MOUTH EVERY 6 HOURS AS NEEDED FOR WHEEZE OR SHORTNESS OF BREATH 8.5 each 2   benzonatate (TESSALON PERLES) 100 MG capsule Take 1 capsule (100 mg total) by mouth 3 (three) times daily as needed. 20 capsule 0   buPROPion (WELLBUTRIN XL) 300 MG 24 hr tablet Take 1 tablet (300 mg total) by mouth daily. 90 tablet 1   Carboxymethylcellulose Sodium (REFRESH TEARS OP) Place 1 drop into both eyes 2 (two) times daily.     cetirizine (ZYRTEC) 10 MG tablet Take 10 mg by mouth daily as needed for allergies.     clonazePAM (KLONOPIN) 0.5 MG tablet TAKE 1 TABLET BY MOUTH TWICE A DAY AS  NEEDED FOR ANXIETY 60 tablet 2   DULoxetine (CYMBALTA) 60 MG capsule TAKE 1 CAPSULE BY MOUTH EVERY DAY 90 capsule 3   ELIQUIS 5 MG TABS tablet TAKE 1  TABLET BY MOUTH TWICE A DAY 180 tablet 1   empagliflozin (JARDIANCE) 10 MG TABS tablet Take 1 tablet (10 mg total) by mouth daily before breakfast. 90 tablet 3   furosemide (LASIX) 40 MG tablet Take 1.5 tablets (60 mg total) by mouth 2 (two) times daily. 270 tablet 3   metoprolol tartrate (LOPRESSOR) 50 MG tablet Take 1.5 tablets (75 mg total) by mouth 2 (two) times daily. 270 tablet 1   Multiple Vitamins-Minerals (ONE-A-DAY WOMENS 50+) TABS Take 1 tablet by mouth daily.     omeprazole (PRILOSEC) 20 MG capsule TAKE 1 CAPSULE BY MOUTH EVERY DAY 90 capsule 3   sodium chloride (OCEAN) 0.65 % SOLN nasal spray Place 1 spray into both nostrils as needed for congestion.     No current facility-administered medications on file prior to visit.    BP 140/90   Pulse 70   Temp 97.6 F (36.4 C) (Oral)   Ht 5' 2.5" (1.588 m)   Wt 144 lb (65.3 kg)   SpO2 99%   BMI 25.92 kg/m       Objective:   Physical Exam Vitals and nursing note reviewed.  Constitutional:      Appearance: Normal appearance.  Skin:    General: Skin is warm and dry.  Neurological:     General: No focal deficit present.     Mental Status: She is alert.     Motor: Tremor present.     Comments: Bilateral upper extremity tremors, more noticeable in hands. No decreased grip strenght   Psychiatric:        Mood and Affect: Mood normal.        Behavior: Behavior normal.        Thought Content: Thought content normal.        Judgment: Judgment normal.       Assessment & Plan:  1. Other polyneuropathy - In addition to Cymblata will trial her on low dose lyrica to see if we can improve her neuropathy  - She will follow up in one month or sooner if needed - pregabalin (LYRICA) 50 MG capsule; Take 1 capsule (50 mg total) by mouth daily.  Dispense: 30 capsule; Refill: 0  2.  Tremors of nervous system  - Ambulatory referral to Neurology  Shirline Frees, NP  Time of total for care on the day of the encounter 33 minutes. This includes time spent in both face-to-face and non-face-to-face activities including preparing for the visit, reviewing the chart, time spent with patient, evaluation and counseling patient/family/caregiver on treatment options for neuropathy and tremors.

## 2022-06-03 ENCOUNTER — Other Ambulatory Visit: Payer: Medicare Other

## 2022-06-03 DIAGNOSIS — Z79899 Other long term (current) drug therapy: Secondary | ICD-10-CM

## 2022-06-04 LAB — BASIC METABOLIC PANEL
BUN/Creatinine Ratio: 23 (ref 12–28)
BUN: 18 mg/dL (ref 8–27)
CO2: 24 mmol/L (ref 20–29)
Calcium: 9.1 mg/dL (ref 8.7–10.3)
Chloride: 101 mmol/L (ref 96–106)
Creatinine, Ser: 0.79 mg/dL (ref 0.57–1.00)
Glucose: 86 mg/dL (ref 70–99)
Potassium: 4.2 mmol/L (ref 3.5–5.2)
Sodium: 142 mmol/L (ref 134–144)
eGFR: 74 mL/min/{1.73_m2} (ref >59)

## 2022-06-07 ENCOUNTER — Ambulatory Visit: Payer: Medicare Other | Admitting: Cardiology

## 2022-06-07 ENCOUNTER — Encounter: Payer: Self-pay | Admitting: Cardiology

## 2022-06-07 VITALS — BP 142/88 | HR 77 | Ht 62.5 in | Wt 149.2 lb

## 2022-06-07 DIAGNOSIS — I471 Supraventricular tachycardia: Secondary | ICD-10-CM

## 2022-06-07 DIAGNOSIS — D6869 Other thrombophilia: Secondary | ICD-10-CM

## 2022-06-07 DIAGNOSIS — I480 Paroxysmal atrial fibrillation: Secondary | ICD-10-CM | POA: Diagnosis not present

## 2022-06-07 DIAGNOSIS — Z79899 Other long term (current) drug therapy: Secondary | ICD-10-CM

## 2022-06-07 MED ORDER — LOSARTAN POTASSIUM 50 MG PO TABS: 50.0000 mg | ORAL_TABLET | Freq: Every day | ORAL | 3 refills | Status: DC

## 2022-06-07 NOTE — Patient Instructions (Addendum)
Medication Instructions:  Your physician has recommended you make the following change in your medication:  START Losartan 50 mg once daily  *If you need a refill on your cardiac medications before your next appointment, please call your pharmacy*   Lab Work: Your physician recommends that you schedule a follow-up appointment in: 1-2 weeks for BMET    Testing/Procedures: None ordered   Follow-Up: At [facility], you and your health needs are our priority.  As part of our continuing mission to provide you with exceptional heart care, we have created designated Provider Care Teams.  These Care Teams include your primary Cardiologist (physician) and Advanced Practice Providers (APPs -  Physician Assistants and Nurse Practitioners) who all work together to provide you with the care you need, when you need it.  Your next appointment:   3 month(s)  The format for your next appointment:   In Person  Provider:   [name], PA-C    Thank you for choosing [facility]!!   Dory Horn, RN [phone]  Other Instructions   Important Information About Sugar

## 2022-06-07 NOTE — Progress Notes (Deleted)
Assessment/Plan:   ***  Subjective:   Julie Rice was seen today in the movement disorders clinic for neurologic consultation at the request of Shirline Frees, NP.  The consultation is for the evaluation of tremor and jerking spells.  Medical records made available to me are reviewed.  Medical records indicate patient had tremors years ago, but those seem to resolve.  Over the last few weeks patient developed tremors as well as jerking in her arms and legs.  Separately, patient was taking gabapentin for her previously diagnosed neuropathy, but that was discontinued at the end of June/early July because of cognitive change.  She was just recently started on Lyrica.  Tremor: {yes no:314532}   How long has it been going on? ***  At rest or with activation?  ***  When is it noted the most?  ***  Fam hx of tremor?  {yes NO:709628}  Located where?  ***  Affected by caffeine:  {yes no:314532}  Affected by alcohol:  {yes no:314532}  Affected by stress:  {yes no:314532}  Affected by fatigue:  {yes no:314532}  Spills soup if on spoon:  {yes no:314532}  Spills glass of liquid if full:  {yes no:314532}  Affects ADL's (tying shoes, brushing teeth, etc):  {yes no:314532}  Tremor inducing meds:  {yes no:314532}  Other Specific Symptoms:  Voice: *** Sleep: ***  Vivid Dreams:  {yes no:314532}  Acting out dreams:  {yes no:314532} Wet Pillows: {yes no:314532} Postural symptoms:  {yes no:314532}  Falls?  {yes no:314532} Bradykinesia symptoms: {parkinson brady:18041} Loss of smell:  {yes no:314532} Loss of taste:  {yes no:314532} Urinary Incontinence:  {yes no:314532} Difficulty Swallowing:  {yes no:314532} Handwriting, micrographia: {yes no:314532} Trouble with ADL's:  {yes no:314532}  Trouble buttoning clothing: {yes no:314532} Depression:  {yes no:314532} Memory changes:  {yes no:314532} Hallucinations:  {yes no:314532}  visual distortions: {yes no:314532} N/V:  {yes  no:314532} Lightheaded:  {yes no:314532}  Syncope: {yes no:314532} Diplopia:  {yes no:314532} Dyskinesia:  {yes no:314532}  Neuroimaging of the brain has *** previously been performed.  It *** available for my review today.  PREVIOUS MEDICATIONS: {Parkinson's RX:18200}  ALLERGIES:   Allergies  Allergen Reactions   Tape Other (See Comments)    Band aides, adhesive tape Redness and pulls skin off   Tramadol Other (See Comments)    Pt has prolonged Qtc interval of 540- cannot give tramadol per pharmacy   Morphine Itching and Rash    Flushing    CURRENT MEDICATIONS:  Current Outpatient Medications  Medication Instructions   acetaminophen (TYLENOL) 1,000 mg, Oral, Every 6 hours PRN   albuterol (VENTOLIN HFA) 108 (90 Base) MCG/ACT inhaler TAKE 2 PUFFS BY MOUTH EVERY 6 HOURS AS NEEDED FOR WHEEZE OR SHORTNESS OF BREATH   benzonatate (TESSALON PERLES) 100 mg, Oral, 3 times daily PRN   buPROPion (WELLBUTRIN XL) 300 mg, Oral, Daily   Carboxymethylcellulose Sodium (REFRESH TEARS OP) 1 drop, Both Eyes, 2 times daily   cetirizine (ZYRTEC) 10 mg, Oral, Daily PRN   clonazePAM (KLONOPIN) 0.5 MG tablet TAKE 1 TABLET BY MOUTH TWICE A DAY AS NEEDED FOR ANXIETY   DULoxetine (CYMBALTA) 60 MG capsule TAKE 1 CAPSULE BY MOUTH EVERY DAY   ELIQUIS 5 MG TABS tablet TAKE 1 TABLET BY MOUTH TWICE A DAY   empagliflozin (JARDIANCE) 10 mg, Oral, Daily before breakfast   furosemide (LASIX) 60 mg, Oral, 2 times daily   losartan (COZAAR) 50 mg, Oral, Daily   metoprolol tartrate (LOPRESSOR) 75 mg, Oral, 2  times daily   Multiple Vitamins-Minerals (ONE-A-DAY WOMENS 50+) TABS 1 tablet, Oral, Daily   omeprazole (PRILOSEC) 20 MG capsule TAKE 1 CAPSULE BY MOUTH EVERY DAY   pregabalin (LYRICA) 50 mg, Oral, Daily   sodium chloride (OCEAN) 0.65 % SOLN nasal spray 1 spray, Each Nare, As needed   spironolactone (ALDACTONE) 25 mg, Oral, Daily    Objective:   PHYSICAL EXAMINATION:    VITALS:  There were no vitals  filed for this visit.  GEN:  The patient appears stated age and is in NAD. HEENT:  Normocephalic, atraumatic.  The mucous membranes are moist. The superficial temporal arteries are without ropiness or tenderness. CV:  RRR Lungs:  CTAB Neck/HEME:  There are no carotid bruits bilaterally.  Neurological examination:  Orientation: The patient is alert and oriented x3.  Cranial nerves: There is good facial symmetry.  Extraocular muscles are intact. The visual fields are full to confrontational testing. The speech is fluent and clear. Soft palate rises symmetrically and there is no tongue deviation. Hearing is intact to conversational tone. Sensation: Sensation is intact to light touch throughout (facial, trunk, extremities). Vibration is intact at the bilateral big toe. There is no extinction with double simultaneous stimulation.  Motor: Strength is 5/5 in the bilateral upper and lower extremities.   Shoulder shrug is equal and symmetric.  There is no pronator drift. Deep tendon reflexes: Deep tendon reflexes are 2/4 at the bilateral biceps, triceps, brachioradialis, patella and achilles. Plantar responses are downgoing bilaterally.  Movement examination: Tone: There is ***tone in the bilateral upper extremities.  The tone in the lower extremities is ***.  Abnormal movements: *** Coordination:  There is *** decremation with RAM's, *** Gait and Station: The patient has *** difficulty arising out of a deep-seated chair without the use of the hands. The patient's stride length is ***.  The patient has a *** pull test.     I have reviewed and interpreted the following labs independently   Chemistry      Component Value Date/Time   NA 142 06/03/2022 1348   K 4.2 06/03/2022 1348   CL 101 06/03/2022 1348   CO2 24 06/03/2022 1348   BUN 18 06/03/2022 1348   CREATININE 0.79 06/03/2022 1348      Component Value Date/Time   CALCIUM 9.1 06/03/2022 1348   ALKPHOS 52 02/18/2022 1217   AST 21  02/18/2022 1217   ALT 19 02/18/2022 1217   BILITOT 0.8 02/18/2022 1217   BILITOT 0.3 12/29/2019 1008      Lab Results  Component Value Date   TSH 2.160 05/14/2022   Lab Results  Component Value Date   WBC 10.5 05/14/2022   HGB 10.9 (L) 05/14/2022   HCT 33.5 (L) 05/14/2022   MCV 88 05/14/2022   PLT 334 05/14/2022      Total time spent on today's visit was ***greater than 60 minutes, including both face-to-face time and nonface-to-face time.  Time included that spent on review of records (prior notes available to me/labs/imaging if pertinent), discussing treatment and goals, answering patient's questions and coordinating care.  Cc:  Shirline Frees, NP

## 2022-06-07 NOTE — Progress Notes (Signed)
Electrophysiology Office Note   Date:  06/07/2022   ID:  Julie Rice, DOB May 30, 1936, MRN [id number]  PCP:  Shirline Frees, NP  Cardiologist:  Eden Emms Primary Electrophysiologist:  Tyra Michelle [name], MD    Chief Complaint: SVT   History of Present Illness: Julie Rice is a 86 y.o. female who is being seen today for the evaluation of SVT at the request of Shirline Frees, NP. Presenting today for electrophysiology evaluation.  She has a history significant for nonobstructive coronary artery disease, chronic systolic heart failure, fibromyalgia, hypertension, atrial fibrillation, TIA, COPD, esophageal strictures.  She presented to the hospital 04/03/2021 with new onset chest pain and palpitations.  She was found to have a wide-complex tachycardia.  This was unresponsive to Cardizem.  She was cardioverted with resumption of normal rhythm.  She was started on amiodarone.  She had an EP study 04/25/2021 which induced a narrow complex tachycardia was terminated at the adenosine.  Ablation was performed for ORT at the floor the coronary sinus.  She had more episodes of SVT and was started on metoprolol.  She presented to general cardiology clinic with shortness of breath and chest pain.  Lasix was started and she has plans for PET scan.  Today, denies symptoms of palpitations, chest pain, shortness of breath, orthopnea, PND, lower extremity edema, claudication, dizziness, presyncope, syncope, bleeding, or neurologic sequela. The patient is tolerating medications without difficulties.  Since that time she has done well.  She has noted no further episodes of SVT.  Her shortness of breath has been improving.  She brings in blood pressure recordings that are consistently greater than 130/80.   Past Medical History:  Diagnosis Date   A-fib (HCC)    Anemia    Anxiety    Bipolar disorder (HCC)    Blood transfusion 1991   autologous pts own blood given    CAD (coronary artery disease)     Cataract    Chest pain    "@ rest, lying down, w/exertion"   CHF (congestive heart failure) (HCC) 07/2020   Chronic back pain    "mostly lower back but I do have upper back pain regularly" (04/06/2018)   COPD (chronic obstructive pulmonary disease) (HCC)    Depression    Esophageal stricture    Fibromyalgia    GERD (gastroesophageal reflux disease)    Heart murmur    "slight" (04/06/2018)   Hiatal hernia    Hyperlipidemia    Patient denies   Hypertension    Internal hemorrhoids    Lumbago    Mitral regurgitation    Osteoarthritis    Osteoporosis    Pneumonia ~ 01/2018   PONV (postoperative nausea and vomiting)    severe ponv, "in the past" (04/06/2018)   Rectal bleeding    Rheumatoid arthritis (HCC)    Spondylosis    TIA (transient ischemic attack) 2013   Urinary incontinence    wears depends   Past Surgical History:  Procedure Laterality Date   ABDOMINAL HYSTERECTOMY  1982   BACK SURGERY     BALLOON DILATION N/A 09/21/2018   Procedure: BALLOON DILATION;  Surgeon: Hilarie Fredrickson, MD;  Location: [facility] ENDOSCOPY;  Service: Endoscopy;  Laterality: N/A;   BALLOON DILATION N/A 08/12/2019   Procedure: BALLOON DILATION;  Surgeon: Lynann Bologna, MD;  Location: [facility] ENDOSCOPY;  Service: Endoscopy;  Laterality: N/A;   BIOPSY  08/12/2019   Procedure: BIOPSY;  Surgeon: Lynann Bologna, MD;  Location: [facility] ENDOSCOPY;  Service: Endoscopy;;  BLADDER SUSPENSION  1980's   BOTOX INJECTION N/A 09/21/2018   Procedure: BOTOX INJECTION;  Surgeon: Hilarie Fredrickson, MD;  Location: [facility] ENDOSCOPY;  Service: Endoscopy;  Laterality: N/A;   BOTOX INJECTION N/A 08/12/2019   Procedure: BOTOX INJECTION;  Surgeon: Lynann Bologna, MD;  Location: [facility] ENDOSCOPY;  Service: Endoscopy;  Laterality: N/A;   CATARACT EXTRACTION W/ INTRAOCULAR LENS  IMPLANT, BILATERAL Bilateral 2010   DILATION AND CURETTAGE OF UTERUS  1961   ESOPHAGEAL MANOMETRY N/A 03/25/2018   Procedure: ESOPHAGEAL MANOMETRY (EM);  Surgeon: Napoleon Form, MD;   Location: [facility] ENDOSCOPY;  Service: Endoscopy;  Laterality: N/A;   ESOPHAGOGASTRODUODENOSCOPY (EGD) WITH PROPOFOL N/A 04/07/2018   Procedure: ESOPHAGOGASTRODUODENOSCOPY (EGD) WITH PROPOFOL;  Surgeon: Sherrilyn Rist, MD;  Location: [facility] ENDOSCOPY;  Service: Gastroenterology;  Laterality: N/A;   ESOPHAGOGASTRODUODENOSCOPY (EGD) WITH PROPOFOL N/A 09/21/2018   Procedure: ESOPHAGOGASTRODUODENOSCOPY (EGD) WITH PROPOFOL;  Surgeon: Hilarie Fredrickson, MD;  Location: [facility] ENDOSCOPY;  Service: Endoscopy;  Laterality: N/A;   ESOPHAGOGASTRODUODENOSCOPY (EGD) WITH PROPOFOL N/A 08/12/2019   Procedure: ESOPHAGOGASTRODUODENOSCOPY (EGD) WITH PROPOFOL;  Surgeon: Lynann Bologna, MD;  Location: [facility] ENDOSCOPY;  Service: Endoscopy;  Laterality: N/A;   HERNIA REPAIR     HIATAL HERNIA REPAIR N/A 08/02/2016   Procedure: LAPAROSCOPIC REPAIR OF LARGE  HIATAL HERNIA;  Surgeon: Glenna Fellows, MD;  Location: [facility] ORS;  Service: General;  Laterality: N/A;   JOINT REPLACEMENT     LAPAROSCOPIC NISSEN FUNDOPLICATION N/A 08/02/2016   Procedure: LAPAROSCOPIC NISSEN FUNDOPLICATION;  Surgeon: Glenna Fellows, MD;  Location: [facility] ORS;  Service: General;  Laterality: N/A;   LUMBAR LAMINECTOMY  1990; 1994; [id number]   "I've got 2 stainless steel rods; 6 screws; 2 ray cages"took bone from right hip to put in back   RIGHT/LEFT HEART CATH AND CORONARY ANGIOGRAPHY N/A 07/26/2019   Procedure: RIGHT/LEFT HEART CATH AND CORONARY ANGIOGRAPHY;  Surgeon: Lyn Records, MD;  Location: [facility] INVASIVE CV LAB;  Service: Cardiovascular;  Laterality: N/A;   SVT ABLATION N/A 04/25/2021   Procedure: SVT ABLATION;  Surgeon: Regan Lemming, MD;  Location: [facility] INVASIVE CV LAB;  Service: Cardiovascular;  Laterality: N/A;   TEE WITHOUT CARDIOVERSION N/A 08/19/2019   Procedure: TRANSESOPHAGEAL ECHOCARDIOGRAM (TEE);  Surgeon: Lewayne Bunting, MD;  Location: [facility] ENDOSCOPY;  Service: Cardiovascular;  Laterality: N/A;   TONSILLECTOMY AND ADENOIDECTOMY  1945   TOTAL KNEE  ARTHROPLASTY Right ~ 1996   TUBAL LIGATION  ~ 1976     Current Outpatient Medications  Medication Sig Dispense Refill   acetaminophen (TYLENOL) 500 MG tablet Take 1,000 mg by mouth every 6 (six) hours as needed for moderate pain, headache or fever.     albuterol (VENTOLIN HFA) 108 (90 Base) MCG/ACT inhaler TAKE 2 PUFFS BY MOUTH EVERY 6 HOURS AS NEEDED FOR WHEEZE OR SHORTNESS OF BREATH 8.5 each 2   benzonatate (TESSALON PERLES) 100 MG capsule Take 1 capsule (100 mg total) by mouth 3 (three) times daily as needed. 20 capsule 0   buPROPion (WELLBUTRIN XL) 300 MG 24 hr tablet Take 1 tablet (300 mg total) by mouth daily. 90 tablet 1   Carboxymethylcellulose Sodium (REFRESH TEARS OP) Place 1 drop into both eyes 2 (two) times daily.     cetirizine (ZYRTEC) 10 MG tablet Take 10 mg by mouth daily as needed for allergies.     clonazePAM (KLONOPIN) 0.5 MG tablet TAKE 1 TABLET BY MOUTH TWICE A DAY AS NEEDED FOR ANXIETY 60 tablet 2   DULoxetine (CYMBALTA) 60 MG capsule  TAKE 1 CAPSULE BY MOUTH EVERY DAY 90 capsule 3   ELIQUIS 5 MG TABS tablet TAKE 1 TABLET BY MOUTH TWICE A DAY 180 tablet 1   empagliflozin (JARDIANCE) 10 MG TABS tablet Take 1 tablet (10 mg total) by mouth daily before breakfast. 90 tablet 3   furosemide (LASIX) 40 MG tablet Take 1.5 tablets (60 mg total) by mouth 2 (two) times daily. 270 tablet 3   losartan (COZAAR) 50 MG tablet Take 1 tablet (50 mg total) by mouth daily. 30 tablet 3   metoprolol tartrate (LOPRESSOR) 50 MG tablet Take 1.5 tablets (75 mg total) by mouth 2 (two) times daily. 270 tablet 1   Multiple Vitamins-Minerals (ONE-A-DAY WOMENS 50+) TABS Take 1 tablet by mouth daily.     omeprazole (PRILOSEC) 20 MG capsule TAKE 1 CAPSULE BY MOUTH EVERY DAY 90 capsule 3   pregabalin (LYRICA) 50 MG capsule Take 1 capsule (50 mg total) by mouth daily. 30 capsule 0   sodium chloride (OCEAN) 0.65 % SOLN nasal spray Place 1 spray into both nostrils as needed for congestion.     spironolactone  (ALDACTONE) 25 MG tablet Take 25 mg by mouth daily.     No current facility-administered medications for this visit.    Allergies:   Tape, Tramadol, and Morphine   Social History:  The patient  reports that she has never smoked. She has never used smokeless tobacco. She reports that she does not drink alcohol and does not use drugs.   Family History:  The patient's family history includes Heart attack in her brother; Heart disease in her brother; Heart disease (age of onset: 87) in her father; Hypertension in her mother; Kidney disease in her mother; Suicidality in her son.   ROS:  Please see the history of present illness.   Otherwise, review of systems is positive for none.   All other systems are reviewed and negative.   PHYSICAL EXAM: VS:  BP (!) 142/88   Pulse 77   Ht 5' 2.5" (1.588 m)   Wt 149 lb 3.2 oz (67.7 kg)   SpO2 95%   BMI 26.85 kg/m  , BMI Body mass index is 26.85 kg/m. GEN: Well nourished, well developed, in no acute distress  HEENT: normal  Neck: no JVD, carotid bruits, or masses Cardiac: RRR; no murmurs, rubs, or gallops,no edema  Respiratory:  clear to auscultation bilaterally, normal work of breathing GI: soft, nontender, nondistended, + BS MS: no deformity or atrophy  Skin: warm and dry Neuro:  Strength and sensation are intact Psych: euthymic mood, full affect  EKG:  EKG is not ordered today. Personal review of the ekg ordered 03/13/22 shows sinus rhythm, anterior Q waves  Recent Labs: 02/18/2022: ALT 19; B Natriuretic Peptide 1,547.1 05/14/2022: Hemoglobin 10.9; Platelets 334; TSH 2.160 05/24/2022: NT-Pro BNP 2,900 06/03/2022: BUN 18; Creatinine, Ser 0.79; Potassium 4.2; Sodium 142    Lipid Panel     Component Value Date/Time   CHOL 172 03/09/2020 0936   TRIG 111.0 03/09/2020 0936   HDL 65.60 03/09/2020 0936   CHOLHDL 3 03/09/2020 0936   VLDL 22.2 03/09/2020 0936   LDLCALC 84 03/09/2020 0936   LDLDIRECT 122.2 06/01/2013 0944     Wt Readings  from Last 3 Encounters:  06/07/22 149 lb 3.2 oz (67.7 kg)  05/31/22 144 lb (65.3 kg)  05/14/22 146 lb (66.2 kg)      Other studies Reviewed: Additional studies/ records that were reviewed today include: TTE 04/04/21  Review of  the above records today demonstrates:   1. Left ventricular ejection fraction, by estimation, is 60 to 65%. The  left ventricle has normal function. Left ventricular endocardial border  not optimally defined to evaluate regional wall motion. There is mild left  ventricular hypertrophy. Left  ventricular diastolic parameters are consistent with Grade I diastolic  dysfunction (impaired relaxation).   2. Right ventricular systolic function is normal. The right ventricular  size is normal. There is normal pulmonary artery systolic pressure. The  estimated right ventricular systolic pressure is 29.0 mmHg.   3. The mitral valve is degenerative. Trivial mitral valve regurgitation.  No evidence of mitral stenosis. Moderate mitral annular calcification.   4. The aortic valve is normal in structure. There is mild calcification  of the aortic valve. Aortic valve regurgitation is mild. No aortic  stenosis is present.   5. The inferior vena cava is normal in size with greater than 50%  respiratory variability, suggesting right atrial pressure of 3 mmHg.   ASSESSMENT AND PLAN:  1.  SVT: Found to have ORT with ablation performed in the coronary sinus.  Currently on metoprolol 75 mg twice daily.  Her metoprolol was increased that she had more episodes of SVT.  She has had no further episodes since increased dose.  No changes.  2.  Nonobstructive coronary artery disease: Has plans for PET scan per general cardiology  3.  Hypertension: Blood pressure elevated today.  Elevated at home as well.  Planning to start losartan 50 mg.  4.  Paroxysmal atrial fibrillation: Currently on Eliquis.  CHA2DS2-VASc of 7.  Remains in sinus rhythm.  5.  Secondary hypercoagulable state:  Currently on Eliquis for atrial fibrillation as above  Current medicines are reviewed at length with the patient today.   The patient does not have concerns regarding her medicines.  The following changes were made today: Start losartan  Labs/ tests ordered today include:  Orders Placed This Encounter  Procedures   Basic metabolic panel      Disposition:   FU with EP app 3 months  Signed, Colyn Miron [name], MD  06/07/2022 12:07 PM     [facility] [address] [place] [address] [phone] (office) [phone] (fax)

## 2022-06-10 ENCOUNTER — Telehealth: Payer: Self-pay | Admitting: Adult Health

## 2022-06-10 ENCOUNTER — Ambulatory Visit: Payer: Medicare Other | Admitting: Neurology

## 2022-06-10 NOTE — Telephone Encounter (Signed)
Pt states that [facility] gave her the wrong time for her appt reminder so she missed her appt.  She wanted [name] to know that she actually went but they wouldn't see her for her test.  Pt is wondering if someone could try to reschedule this appt for her.  Please give pt a call back.

## 2022-06-10 NOTE — Telephone Encounter (Signed)
Pt states she missed her neurology appt. Pt states she was called Friday & told she had an appt today at 0945 & missed appt. Pt advised to call & reschedule the appt directly with the neurology office.

## 2022-06-14 ENCOUNTER — Other Ambulatory Visit: Payer: Medicare Other

## 2022-06-14 DIAGNOSIS — M13862 Other specified arthritis, left knee: Secondary | ICD-10-CM | POA: Diagnosis not present

## 2022-06-14 DIAGNOSIS — Z79899 Other long term (current) drug therapy: Secondary | ICD-10-CM

## 2022-06-14 DIAGNOSIS — Z0100 Encounter for examination of eyes and vision without abnormal findings: Secondary | ICD-10-CM | POA: Diagnosis not present

## 2022-06-14 DIAGNOSIS — I480 Paroxysmal atrial fibrillation: Secondary | ICD-10-CM | POA: Diagnosis not present

## 2022-06-15 LAB — BASIC METABOLIC PANEL
BUN/Creatinine Ratio: 27 (ref 12–28)
BUN: 26 mg/dL (ref 8–27)
CO2: 27 mmol/L (ref 20–29)
Calcium: 9 mg/dL (ref 8.7–10.3)
Chloride: 99 mmol/L (ref 96–106)
Creatinine, Ser: 0.97 mg/dL (ref 0.57–1.00)
Glucose: 101 mg/dL — ABNORMAL HIGH (ref 70–99)
Potassium: 4.4 mmol/L (ref 3.5–5.2)
Sodium: 142 mmol/L (ref 134–144)
eGFR: 58 mL/min/{1.73_m2} — ABNORMAL LOW (ref >59)

## 2022-06-25 DIAGNOSIS — J9611 Chronic respiratory failure with hypoxia: Secondary | ICD-10-CM | POA: Diagnosis not present

## 2022-06-25 DIAGNOSIS — J449 Chronic obstructive pulmonary disease, unspecified: Secondary | ICD-10-CM | POA: Diagnosis not present

## 2022-07-01 ENCOUNTER — Other Ambulatory Visit: Payer: Self-pay | Admitting: Adult Health

## 2022-07-01 DIAGNOSIS — G6289 Other specified polyneuropathies: Secondary | ICD-10-CM

## 2022-07-02 ENCOUNTER — Ambulatory Visit: Payer: Medicare Other | Admitting: Adult Health

## 2022-07-09 ENCOUNTER — Telehealth: Payer: Self-pay | Admitting: Adult Health

## 2022-07-09 NOTE — Telephone Encounter (Signed)
Patient came in because she stated her appointment was told that it was today.  She wanted a message to be sent back to [facility] letting him know that her BP has been a little low and she feels like she is going to pass out today.  Patient is wanting a call back.  She has an appt for 07/16/22.

## 2022-07-10 NOTE — Telephone Encounter (Signed)
Spoke to pt and she stated that her daughter-in-law is not answering to see she if she could bring pt in tomorrow. Pt stated that she prob. wont be able to make it this week due to obligation and upcoming appts. PT advised that if sx worsens she should seek help at ED or UC. Pt verbalized understanding.

## 2022-07-10 NOTE — Telephone Encounter (Signed)
stated that when she get up in the morning she is achy and feet are purple. Pt claims she is easily bruising from knee to ankle. Pt stated that she also "shaky"  and gait is unsteady. Pt stated that her BP reading said 69/60 and 89/60. Pt stated she ate a meal high in salt to bring it up. Pt stated that her Bp is always elevated she doesn't know why its dropping so much now. PT stated she is starting to have nose bleeds now more frequently. Pt stated that she feels a little better today but is worried. Advised pt that I will send to [facility] for advise.

## 2022-07-10 NOTE — Telephone Encounter (Signed)
Called pt yesterday but no answer. Tried again now with no response.

## 2022-07-16 ENCOUNTER — Encounter: Payer: Self-pay | Admitting: Adult Health

## 2022-07-16 ENCOUNTER — Ambulatory Visit (INDEPENDENT_AMBULATORY_CARE_PROVIDER_SITE_OTHER): Payer: Medicare Other | Admitting: Adult Health

## 2022-07-16 VITALS — BP 122/74 | HR 70 | Temp 97.5°F | Ht 62.5 in | Wt 148.0 lb

## 2022-07-16 DIAGNOSIS — I10 Essential (primary) hypertension: Secondary | ICD-10-CM

## 2022-07-16 DIAGNOSIS — G6289 Other specified polyneuropathies: Secondary | ICD-10-CM

## 2022-07-16 DIAGNOSIS — F411 Generalized anxiety disorder: Secondary | ICD-10-CM

## 2022-07-16 DIAGNOSIS — R040 Epistaxis: Secondary | ICD-10-CM

## 2022-07-16 NOTE — Patient Instructions (Addendum)
Please get a blood pressure cuff for your upper arm   Also get a nasal spray called Ayr nasal gel   I am going to decrease your klonopin to 0.25 mg BID   Lets follow up in 2 weeks.

## 2022-07-16 NOTE — Progress Notes (Signed)
Subjective:    Patient ID: Julie Rice, female    DOB: [birth year], 86 y.o.   MRN: [id number]  HPI 86 year old female who  has a past medical history of A-fib (HCC), Anemia, Anxiety, Bipolar disorder (HCC), Blood transfusion (1991), CAD (coronary artery disease), Cataract, Chest pain, CHF (congestive heart failure) (HCC) (07/2020), Chronic back pain, COPD (chronic obstructive pulmonary disease) (HCC), Depression, Esophageal stricture, Fibromyalgia, GERD (gastroesophageal reflux disease), Heart murmur, Hiatal hernia, Hyperlipidemia, Hypertension, Internal hemorrhoids, Lumbago, Mitral regurgitation, Osteoarthritis, Osteoporosis, Pneumonia (~ 01/2018), PONV (postoperative nausea and vomiting), Rectal bleeding, Rheumatoid arthritis (HCC), Spondylosis, TIA (transient ischemic attack) (2013), and Urinary incontinence.  She presents to the office today for concern for hypotension. She reports that last week she felt lightheaded and pre syncopal multiple days out of the week with low blood pressure readings in the 60's systolic ( wrist cuff), since then she had had blood pressure readings in the low 100s to 120s systolic.  She has not felt as lightheaded or presyncopal. She reports that she did not take any extra of her blood pressure medication   Additionally, she reports that she has had 2 nosebleeds over the last week out of the left nare.  She is on Eliquis and uses a saline nasal spray daily.  Nosebleeds were able to be stopped without too much difficulty.  She would also like to cut down on Klonopin dosage, she tried coming off the medication completely but started to go through withdrawal symptoms.  Polyneuropathy -was most recently placed on Lyrica 75 mg daily.  In the past she was taking gabapentin 300 mg 3 times daily but this was stopped due to cognitive function declining.  She has noticed an improvement in her cognitive function since stopping gabapentin and continues with Cymbalta 60 mg.  She  is unsure of how well the Lyrica is actually working to help control her symptoms as she continues to have numbness and tingling in her lower extremities.   Review of Systems See HPI   Past Medical History:  Diagnosis Date   A-fib (HCC)    Anemia    Anxiety    Bipolar disorder (HCC)    Blood transfusion 1991   autologous pts own blood given    CAD (coronary artery disease)    Cataract    Chest pain    "@ rest, lying down, w/exertion"   CHF (congestive heart failure) (HCC) 07/2020   Chronic back pain    "mostly lower back but I do have upper back pain regularly" (04/06/2018)   COPD (chronic obstructive pulmonary disease) (HCC)    Depression    Esophageal stricture    Fibromyalgia    GERD (gastroesophageal reflux disease)    Heart murmur    "slight" (04/06/2018)   Hiatal hernia    Hyperlipidemia    Patient denies   Hypertension    Internal hemorrhoids    Lumbago    Mitral regurgitation    Osteoarthritis    Osteoporosis    Pneumonia ~ 01/2018   PONV (postoperative nausea and vomiting)    severe ponv, "in the past" (04/06/2018)   Rectal bleeding    Rheumatoid arthritis (HCC)    Spondylosis    TIA (transient ischemic attack) 2013   Urinary incontinence    wears depends    Social History   Socioeconomic History   Marital status: Widowed    Spouse name: Not on file   Number of children: 2   Years of education: Not  on file   Highest education level: 12th grade  Occupational History   Occupation: retired    Fish farm manager: RETIRED  Tobacco Use   Smoking status: Never   Smokeless tobacco: Never  Vaping Use   Vaping Use: Never used  Substance and Sexual Activity   Alcohol use: Never   Drug use: Never   Sexual activity: Not Currently    Comment: 1st intercourse 5 yo-1 partner  Other Topics Concern   Not on file  Social History Narrative   Retired    Widowed   Social Determinants of Health   Financial Resource Strain: [place]  (02/14/2022)   Overall Financial  Resource Strain (CARDIA)    Difficulty of Paying Living Expenses: Not hard at all  Food Insecurity: No Food Insecurity (02/14/2022)   Hunger Vital Sign    Worried About Running Out of Food in the Last Year: Never true    [place] in the Last Year: Never true  Transportation Needs: No Transportation Needs (02/14/2022)   PRAPARE - Hydrologist (Medical): No    Lack of Transportation (Non-Medical): No  Physical Activity: Inactive (02/14/2022)   Exercise Vital Sign    Days of Exercise per Week: 0 days    Minutes of Exercise per Session: 0 min  Stress: No Stress Concern Present (02/14/2022)   [place]    Feeling of Stress : Not at all  Social Connections: Moderately Integrated (02/14/2022)   Social Connection and Isolation Panel [NHANES]    Frequency of Communication with Friends and Family: More than three times a week    Frequency of Social Gatherings with Friends and Family: More than three times a week    Attends Religious Services: More than 4 times per year    Active Member of [employer] or Organizations: Yes    Attends Archivist Meetings: More than 4 times per year    Marital Status: Widowed  Intimate Partner Violence: Not At Risk (02/14/2022)   Humiliation, Afraid, Rape, and Kick questionnaire    Fear of Current or Ex-Partner: No    Emotionally Abused: No    Physically Abused: No    Sexually Abused: No    Past Surgical History:  Procedure Laterality Date   ABDOMINAL HYSTERECTOMY  1982   BACK SURGERY     BALLOON DILATION N/A 09/21/2018   Procedure: BALLOON DILATION;  Surgeon: Irene Shipper, MD;  Location: [facility] ENDOSCOPY;  Service: Endoscopy;  Laterality: N/A;   BALLOON DILATION N/A 08/12/2019   Procedure: BALLOON DILATION;  Surgeon: Jackquline Denmark, MD;  Location: [facility] ENDOSCOPY;  Service: Endoscopy;  Laterality: N/A;   BIOPSY  08/12/2019   Procedure: BIOPSY;  Surgeon: Jackquline Denmark, MD;  Location: [place];  Service: Endoscopy;;   BLADDER SUSPENSION  1980's   BOTOX INJECTION N/A 09/21/2018   Procedure: BOTOX INJECTION;  Surgeon: Irene Shipper, MD;  Location: [facility] ENDOSCOPY;  Service: Endoscopy;  Laterality: N/A;   BOTOX INJECTION N/A 08/12/2019   Procedure: BOTOX INJECTION;  Surgeon: Jackquline Denmark, MD;  Location: [facility] ENDOSCOPY;  Service: Endoscopy;  Laterality: N/A;   CATARACT EXTRACTION W/ INTRAOCULAR LENS  IMPLANT, BILATERAL Bilateral 2010   DILATION AND CURETTAGE OF UTERUS  1961   ESOPHAGEAL MANOMETRY N/A 03/25/2018   Procedure: ESOPHAGEAL MANOMETRY (EM);  Surgeon: Mauri Pole, MD;  Location: [facility] ENDOS[facility];  Service: Endoscopy;  Laterality: N/A;   ESOPHAGOGASTRODUODENOSCOPY (EGD) WITH PROPOFOL N/A 04/07/2018  Procedure: ESOPHAGOGASTRODUODENOSCOPY (EGD) WITH PROPOFOL;  Surgeon: Sherrilyn Rist, MD;  Location: [facility] ENDOSCOPY;  Service: Gastroenterology;  Laterality: N/A;   ESOPHAGOGASTRODUODENOSCOPY (EGD) WITH PROPOFOL N/A 09/21/2018   Procedure: ESOPHAGOGASTRODUODENOSCOPY (EGD) WITH PROPOFOL;  Surgeon: Hilarie Fredrickson, MD;  Location: [facility] ENDOS[facility];  Service: Endoscopy;  Laterality: N/A;   ESOPHAGOGASTRODUODENOSCOPY (EGD) WITH PROPOFOL N/A 08/12/2019   Procedure: ESOPHAGOGASTRODUODENOSCOPY (EGD) WITH PROPOFOL;  Surgeon: Lynann Bologna, MD;  Location: [facility] ENDOSCOPY;  Service: Endoscopy;  Laterality: N/A;   HERNIA REPAIR     HIATAL HERNIA REPAIR N/A 08/02/2016   Procedure: LAPAROSCOPIC REPAIR OF LARGE  HIATAL HERNIA;  Surgeon: Glenna Fellows, MD;  Location: [facility] ORS;  Service: General;  Laterality: N/A;   JOINT REPLACEMENT     LAPAROSCOPIC NISSEN FUNDOPLICATION N/A 08/02/2016   Procedure: LAPAROSCOPIC NISSEN FUNDOPLICATION;  Surgeon: Glenna Fellows, MD;  Location: [facility] ORS;  Service: General;  Laterality: N/A;   LUMBAR LAMINECTOMY  1990; 1994; [id number]   "I've got 2 stainless steel rods; 6 screws; 2 ray cages"took bone from right hip to put in back   RIGHT/LEFT  HEART CATH AND CORONARY ANGIOGRAPHY N/A 07/26/2019   Procedure: RIGHT/LEFT HEART CATH AND CORONARY ANGIOGRAPHY;  Surgeon: Lyn Records, MD;  Location: [facility] INVASIVE CV LAB;  Service: Cardiovascular;  Laterality: N/A;   SVT ABLATION N/A 04/25/2021   Procedure: SVT ABLATION;  Surgeon: Regan Lemming, MD;  Location: [facility] INVASIVE CV LAB;  Service: Cardiovascular;  Laterality: N/A;   TEE WITHOUT CARDIOVERSION N/A 08/19/2019   Procedure: TRANSESOPHAGEAL ECHOCARDIOGRAM (TEE);  Surgeon: Lewayne Bunting, MD;  Location: [facility] ENDOSCOPY;  Service: Cardiovascular;  Laterality: N/A;   TONSILLECTOMY AND ADENOIDECTOMY  1945   TOTAL KNEE ARTHROPLASTY Right ~ 1996   TUBAL LIGATION  ~ 1976    Family History  Problem Relation Age of Onset   Kidney disease Mother    Hypertension Mother    Heart disease Father 51       die of MI at age 69   Suicidality Son    Heart disease Brother    Heart attack Brother    Colon cancer Neg Hx    Esophageal cancer Neg Hx    Pancreatic cancer Neg Hx    Rectal cancer Neg Hx    Stomach cancer Neg Hx     Allergies  Allergen Reactions   Tape Other (See Comments)    Band aides, adhesive tape Redness and pulls skin off   Tramadol Other (See Comments)    Pt has prolonged Qtc interval of 540- cannot give tramadol per pharmacy   Morphine Itching and Rash    Flushing    Current Outpatient Medications on File Prior to Visit  Medication Sig Dispense Refill   acetaminophen (TYLENOL) 500 MG tablet Take 1,000 mg by mouth every 6 (six) hours as needed for moderate pain, headache or fever.     albuterol (VENTOLIN HFA) 108 (90 Base) MCG/ACT inhaler TAKE 2 PUFFS BY MOUTH EVERY 6 HOURS AS NEEDED FOR WHEEZE OR SHORTNESS OF BREATH 8.5 each 2   benzonatate (TESSALON PERLES) 100 MG capsule Take 1 capsule (100 mg total) by mouth 3 (three) times daily as needed. 20 capsule 0   buPROPion (WELLBUTRIN XL) 300 MG 24 hr tablet Take 1 tablet (300 mg total) by mouth daily. 90 tablet 1    Carboxymethylcellulose Sodium (REFRESH TEARS OP) Place 1 drop into both eyes 2 (two) times daily.     cetirizine (ZYRTEC) 10 MG tablet Take 10 mg by mouth  daily as needed for allergies.     clonazePAM (KLONOPIN) 0.5 MG tablet TAKE 1 TABLET BY MOUTH TWICE A DAY AS NEEDED FOR ANXIETY 60 tablet 2   DULoxetine (CYMBALTA) 60 MG capsule TAKE 1 CAPSULE BY MOUTH EVERY DAY 90 capsule 3   ELIQUIS 5 MG TABS tablet TAKE 1 TABLET BY MOUTH TWICE A DAY 180 tablet 1   KLOR-CON M20 20 MEQ tablet Take 20 mEq by mouth daily.     losartan (COZAAR) 50 MG tablet Take 1 tablet (50 mg total) by mouth daily. 30 tablet 3   metoprolol tartrate (LOPRESSOR) 50 MG tablet Take 1.5 tablets (75 mg total) by mouth 2 (two) times daily. 270 tablet 1   Multiple Vitamins-Minerals (ONE-A-DAY WOMENS 50+) TABS Take 1 tablet by mouth daily.     omeprazole (PRILOSEC) 20 MG capsule TAKE 1 CAPSULE BY MOUTH EVERY DAY 90 capsule 3   pregabalin (LYRICA) 50 MG capsule TAKE 1 CAPSULE BY MOUTH EVERY DAY 90 capsule 0   sodium chloride (OCEAN) 0.65 % SOLN nasal spray Place 1 spray into both nostrils as needed for congestion.     spironolactone (ALDACTONE) 25 MG tablet Take 25 mg by mouth daily.     No current facility-administered medications on file prior to visit.    BP 120/60   Pulse 70   Temp (!) 97.5 F (36.4 C) (Oral)   Ht 5' 2.5" (1.588 m)   Wt 148 lb (67.1 kg)   SpO2 96%   BMI 26.64 kg/m       Objective:   Physical Exam Vitals and nursing note reviewed.  Constitutional:      Appearance: Normal appearance.  HENT:     Nose: No signs of injury, laceration, congestion or rhinorrhea.     Right Nostril: No epistaxis.     Left Nostril: No epistaxis.  Cardiovascular:     Rate and Rhythm: Normal rate and regular rhythm.     Pulses: Normal pulses.     Heart sounds: Normal heart sounds.  Pulmonary:     Effort: Pulmonary effort is normal.     Breath sounds: Normal breath sounds.  Neurological:     General: No focal deficit  present.     Mental Status: She is alert and oriented to person, place, and time.  Psychiatric:        Mood and Affect: Mood normal.        Behavior: Behavior normal.        Thought Content: Thought content normal.       Assessment & Plan:  1. Essential hypertension - No change in medication at this time - will have her order a upper arm cuff and monitor her BP at home  - Follow up in 2 weeks  - Bring cuff with her  2. Generalized Anxiety Disorder  - will have her cut back on klonopin use to 0.25 mg BID and follow up in 2 weeks and then cut back more   3. Other polyneuropathy - I am ok with her stopping Lyrica if she would like to see if she is getting any benefit from it   4. Epistaxis - No signs of trauma or injury. Nasal canal is quite dry. Advised using Ayr Gel    Time spent with patient today was 47 minutes which consisted of chart review, discussing hypotension, polyneuropathy, anxiety and nose bleeds, work up, treatment answering questions and documentation.   [name], NP

## 2022-07-17 ENCOUNTER — Other Ambulatory Visit: Payer: Self-pay | Admitting: Cardiology

## 2022-07-17 ENCOUNTER — Other Ambulatory Visit: Payer: Self-pay | Admitting: Adult Health

## 2022-07-17 ENCOUNTER — Other Ambulatory Visit: Payer: Self-pay | Admitting: Interventional Cardiology

## 2022-07-17 DIAGNOSIS — F339 Major depressive disorder, recurrent, unspecified: Secondary | ICD-10-CM

## 2022-07-20 ENCOUNTER — Other Ambulatory Visit: Payer: Self-pay | Admitting: Adult Health

## 2022-07-20 DIAGNOSIS — Z760 Encounter for issue of repeat prescription: Secondary | ICD-10-CM

## 2022-07-26 NOTE — Progress Notes (Unsigned)
Assessment/Plan:   Essential Tremor.  -This is evidenced by the symmetrical nature and longstanding hx of gradually getting worse.  We discussed nature and pathophysiology.  We discussed that this can continue to gradually get worse with time.  We discussed that some medications can worsen this, as can caffeine use.  We discussed medication therapy as well as surgical therapy.  We discussed that this is likely worse in the morning because she is using the albuterol, which induces tremor.  She only uses the albuterol in the morning.  We discussed that anxiety/stress can worsen essential tremor as well.  -Discussed with the patient that I really do not recommend medication for her.  I think that risks outweigh benefits.  Primidone interacts with the Eliquis.  She is already on metoprolol, so another beta-blocker is not indicated.  She has already been on gabapentin with side effects.  Anticholinergic medications would have great risk in her age group, such as risk of falls, confusion and hallucinations.   -We did talk about medicinal strategies such as weighted gloves, forks, spoons.  We discussed [employer].  Follow-up as needed.    Subjective:   Julie Rice was seen today in the movement disorders clinic for neurologic consultation at the request of [name], NP.  Pt with sister in law who supplements the history.   The consultation is for the evaluation of tremor and jerking spells.  Medical records made available to me are reviewed.  Medical records indicate patient had tremors years ago.  Pt states that it has been going on since 2013.  Separately, patient was taking gabapentin for her previously diagnosed neuropathy, but that was discontinued at the end of June/early July because of cognitive change.  She was just recently started on Lyrica, but then was not sure if it was working.  Records indicate that they said to hold it but pt reports she is on it    When tremor "spells" started  in 2013, they started daily, without known incident.  She admits to tragedy with loss of both of her sons (one to drunk driver and one to suicide).  She states that it was bad for a few a years and then got better but recently got worse.  Its mostly in the AM.  Tremor is better as the day goes on.  Its mostly shaking of the bilateral UE, with use, and not with rest.  Tremor: Yes.     How long has it been going on? Since 2013  At rest or with activation?  activation  Fam hx of tremor?  Yes.  , brother  Located where?  Bilateral UE  Affected by caffeine:  No.  Affected by alcohol:  doesn't drink any  Affected by stress:  yes, significantly  Affected by fatigue:  No.  Spills soup if on spoon:  depends; does okay if careful  Affects ADL's (tying shoes, brushing teeth, etc):  No.  Tremor inducing meds:  Yes.   Albuterol (uses this 1 time in the AM) - she doesn't know if it picks up tremor  Other Specific Symptoms:  Voice: little hoarse Postural symptoms:  "balance is fair"  Falls?  Last fall was 6 months ago  - was coming in the front door and tripped over threshold - didn't get hurt Bradykinesia symptoms: difficulty getting out of a chair (attributes to back/multiple back surgeries) Loss of smell:  Yes.   - started with Covid 1.5 years ago Urinary Incontinence:  Yes.  ,  some stress incontinence - has had bladder suspension sx Difficulty Swallowing:  occ Trouble with ADL's:  No.  Trouble buttoning clothing: Yes.  , due to arthritis  Neuroimaging of the brain was done in 2021.  I reviewed it.  There was mild to moderate small vessel disease.    ALLERGIES:   Allergies  Allergen Reactions   Tape Other (See Comments)    Band aides, adhesive tape Redness and pulls skin off   Tramadol Other (See Comments)    Pt has prolonged Qtc interval of 540- cannot give tramadol per pharmacy   Morphine Itching and Rash    Flushing    CURRENT MEDICATIONS:  Current Outpatient Medications   Medication Instructions   acetaminophen (TYLENOL) 1,000 mg, Oral, Every 6 hours PRN   albuterol (VENTOLIN HFA) 108 (90 Base) MCG/ACT inhaler TAKE 2 PUFFS BY MOUTH EVERY 6 HOURS AS NEEDED FOR WHEEZE OR SHORTNESS OF BREATH   benzonatate (TESSALON PERLES) 100 mg, Oral, 3 times daily PRN   buPROPion (WELLBUTRIN XL) 150 MG 24 hr tablet TAKE 1 TABLET BY MOUTH EVERY DAY   buPROPion (WELLBUTRIN XL) 300 mg, Oral, Daily   Carboxymethylcellulose Sodium (REFRESH TEARS OP) 1 drop, Both Eyes, 2 times daily   cetirizine (ZYRTEC) 10 mg, Oral, Daily PRN   clonazePAM (KLONOPIN) 0.25 mg, Oral, 2 times daily PRN   DULoxetine (CYMBALTA) 60 MG capsule TAKE 1 CAPSULE BY MOUTH EVERY DAY   ELIQUIS 5 MG TABS tablet TAKE 1 TABLET BY MOUTH TWICE A DAY   KLOR-CON M20 20 MEQ tablet 20 mEq, Oral, Daily   losartan (COZAAR) 50 mg, Oral, Daily   metoprolol tartrate (LOPRESSOR) 75 mg, Oral, 2 times daily   Multiple Vitamins-Minerals (ONE-A-DAY WOMENS 50+) TABS 1 tablet, Oral, Daily   omeprazole (PRILOSEC) 20 MG capsule TAKE 1 CAPSULE BY MOUTH EVERY DAY   pregabalin (LYRICA) 50 MG capsule Oral, Daily   sodium chloride (OCEAN) 0.65 % SOLN nasal spray 1 spray, Each Nare, As needed   spironolactone (ALDACTONE) 25 mg, Oral, Daily    Objective:   PHYSICAL EXAMINATION:    VITALS:   Vitals:   07/29/22 1021  BP: 128/77  Pulse: 72  SpO2: 96%  Weight: 151 lb 6.4 oz (68.7 kg)  Height:  (1.575 m)    GEN:  The patient appears stated age and is in NAD. HEENT:  Normocephalic, atraumatic.  The mucous membranes are moist. The superficial temporal arteries are without ropiness or tenderness. CV:  RRR Lungs:  CTAB Neck/HEME:  There are no carotid bruits bilaterally.  Neurological examination:  Orientation: The patient is alert and oriented x3.  Cranial nerves: There is good facial symmetry.  Extraocular muscles are intact. The visual fields are full to confrontational testing. The speech is fluent and clear. Soft palate  rises symmetrically and there is no tongue deviation. Hearing is intact to conversational tone. Sensation: Sensation is intact to light touch throughout (facial, trunk, extremities). Vibration is decreased distally. There is no extinction with double simultaneous stimulation.  Motor: Strength is 5/5 in the bilateral upper and lower extremities.   Shoulder shrug is equal and symmetric.  There is no pronator drift. Deep tendon reflexes: Deep tendon reflexes are 2/4 at the bilateral biceps, triceps, brachioradialis, 3/4 patella with cross adductor reflexes and trace at the bilateral achilles. Plantar responses are downgoing bilaterally.  Movement examination: Tone: There is nl tone in the bilateral upper extremities.  The tone in the lower extremities is nl.  Abnormal movements: there  is no rest tremor.  there is a postural tremor and intention tremor that is better when she is given a weight.  She has some trouble pouring water from 1 glass to another, but does not spill any significant degree of water. Coordination:  There is no decremation with RAM's, with any form of RAMS, including alternating supination and pronation of the forearm, hand opening and closing, finger taps, heel taps and toe taps.  Gait and Station: The patient pushes off to arise.  She is slow to ambulate (believes it is because of knee pain and needs a knee replacement).  She is somewhat unstable. I have reviewed and interpreted the following labs independently   Chemistry      Component Value Date/Time   NA 142 06/14/2022 1441   K 4.4 06/14/2022 1441   CL 99 06/14/2022 1441   CO2 27 06/14/2022 1441   BUN 26 06/14/2022 1441   CREATININE 0.97 06/14/2022 1441      Component Value Date/Time   CALCIUM 9.0 06/14/2022 1441   ALKPHOS 52 02/18/2022 1217   AST 21 02/18/2022 1217   ALT 19 02/18/2022 1217   BILITOT 0.8 02/18/2022 1217   BILITOT 0.3 12/29/2019 1008      Lab Results  Component Value Date   TSH 2.160 05/14/2022    Lab Results  Component Value Date   WBC 10.5 05/14/2022   HGB 10.9 (L) 05/14/2022   HCT 33.5 (L) 05/14/2022   MCV 88 05/14/2022   PLT 334 05/14/2022      Total time spent on today's visit was 60 minutes, including both face-to-face time and nonface-to-face time.  Time included that spent on review of records (prior notes available to me/labs/imaging if pertinent), discussing treatment and goals, answering patient's questions and coordinating care.  Cc:  Shirline Frees, NP

## 2022-07-29 ENCOUNTER — Encounter: Payer: Self-pay | Admitting: Neurology

## 2022-07-29 ENCOUNTER — Ambulatory Visit: Payer: Medicare Other | Admitting: Neurology

## 2022-07-29 VITALS — BP 128/77 | HR 72 | Ht 62.0 in | Wt 151.4 lb

## 2022-07-29 DIAGNOSIS — G250 Essential tremor: Secondary | ICD-10-CM

## 2022-07-29 NOTE — Patient Instructions (Signed)
We discussed weighted spoons and forks and gloves.  We discussed that the medications for tremor either interact with your current meds or the risks of the meds outweigh the benefits.  Essential Tremor A tremor is trembling or shaking that a person cannot control. Most tremors affect the hands or arms. Tremors can also affect the head, vocal cords, legs, and other parts of the body. Essential tremor is a tremor without a known cause. Usually, it occurs while a person is trying to perform an action. It tends to get worse gradually as a person ages. What are the causes? The cause of this condition is not known, but it often runs in families. What increases the risk? You are more likely to develop this condition if: You have a family member with essential tremor. You are 84 years of age or older. What are the signs or symptoms? The main sign of a tremor is a rhythmic shaking of certain parts of your body that is uncontrolled and unintentional. You may: Have difficulty eating with a spoon or fork. Have difficulty writing. Nod your head up and down or side to side. Have a quivering voice. The shaking may: Get worse over time. Come and go. Be more noticeable on one side of your body. Get worse due to stress, tiredness (fatigue), caffeine, and extreme heat or cold. How is this diagnosed? This condition may be diagnosed based on: Your symptoms and medical history. A physical exam. There is no single test to diagnose an essential tremor. However, your health care provider may order tests to rule out other causes of your condition. These may include: Blood and urine tests. Imaging studies of your brain, such as a CT scan or MRI. How is this treated? Treatment for essential tremor depends on the severity of the condition. Mild tremors may not need treatment if they do not affect your day-to-day life. Severe tremors may need to be treated using one or more of the following  options: Medicines. Injections of a substance called botulinum toxin. Procedures such as deep brain stimulation (DBS) implantation or MRI-guided ultrasound treatment. Lifestyle changes. Occupational or physical therapy. Follow these instructions at home: Lifestyle  Do not use any products that contain nicotine or tobacco. These products include cigarettes, chewing tobacco, and vaping devices, such as e-cigarettes. If you need help quitting, ask your health care provider. Limit your caffeine intake as told by your health care provider. Try to get 8 hours of sleep each night. Find ways to manage your stress that fit your lifestyle and personality. Consider trying meditation or yoga. Try to anticipate stressful situations and allow extra time to manage them. If you are struggling emotionally with the effects of your tremor, consider working with a mental health provider. General instructions Take over-the-counter and prescription medicines only as told by your health care provider. Avoid extreme heat and extreme cold. Keep all follow-up visits. This is important. Visits may include physical therapy visits. Where to find more information [employer] [facility] Neurological Disorders and Stroke: [employer] Contact a health care provider if: You experience any changes in the location or intensity of your tremors. You start having a tremor after starting a new medicine. You have a tremor with other symptoms, such as: Numbness. Tingling. Pain. Weakness. Your tremor gets worse. Your tremor interferes with your daily life. You feel down, blue, or sad for at least 2 weeks in a row. Worrying about your tremor and what other people think about you interferes with your everyday  life functions, including relationships, work, or school. Summary Essential tremor is a tremor without a known cause. Usually, it occurs when you are trying to perform an action. You are more likely to develop this  condition if you have a family member with essential tremor. The main sign of a tremor is a rhythmic shaking of certain parts of your body that is uncontrolled and unintentional. Treatment for essential tremor depends on the severity of the condition. This information is not intended to replace advice given to you by your health care provider. Make sure you discuss any questions you have with your health care provider. Document Revised: 08/10/2021 Document Reviewed: 08/10/2021 Elsevier Patient Education  [place].

## 2022-07-30 ENCOUNTER — Ambulatory Visit (INDEPENDENT_AMBULATORY_CARE_PROVIDER_SITE_OTHER): Payer: Medicare Other | Admitting: Adult Health

## 2022-07-30 ENCOUNTER — Encounter: Payer: Self-pay | Admitting: Adult Health

## 2022-07-30 VITALS — BP 130/68 | HR 61 | Temp 97.5°F | Wt 151.6 lb

## 2022-07-30 DIAGNOSIS — Z23 Encounter for immunization: Secondary | ICD-10-CM | POA: Diagnosis not present

## 2022-07-30 DIAGNOSIS — I10 Essential (primary) hypertension: Secondary | ICD-10-CM

## 2022-07-30 NOTE — Progress Notes (Addendum)
Subjective:    Patient ID: Julie Rice, female    DOB: [birth year], 86 y.o.   MRN: [id number]  HPI  86 year old female who  has a past medical history of A-fib (HCC), Anemia, Anxiety, Bipolar disorder (HCC), Blood transfusion (1991), CAD (coronary artery disease), Cataract, Chest pain, CHF (congestive heart failure) (HCC) (07/2020), Chronic back pain, COPD (chronic obstructive pulmonary disease) (HCC), Depression, Esophageal stricture, Fibromyalgia, GERD (gastroesophageal reflux disease), Heart murmur, Hiatal hernia, Hyperlipidemia, Hypertension, Internal hemorrhoids, Lumbago, Mitral regurgitation, Osteoarthritis, Osteoporosis, Pneumonia (~ 01/2018), PONV (postoperative nausea and vomiting), Rectal bleeding, Rheumatoid arthritis (HCC), Spondylosis, TIA (transient ischemic attack) (2013), and Urinary incontinence.  She presents to the office today for 2-week follow-up regarding hypo-/hypertension.  When she was last seen she reported that she felt lightheaded and presyncopal multiple days out of the week with low blood pressure readings in the 60s systolic using a wrist cuff.  She was seen the office her blood pressure was 120/60.  We encouraged her to get an upper arm blood pressure cuff to monitor her blood pressures at home.  There were no changes in medication and she was continued on spironolactone 25 mg daily, Lopressor 75 mg twice daily, and losartan 50 mg daily.   Today she reports that she did not get an upper arm blood pressure cuff but she has been checking more frequently at home with blood pressure readings in the 120s to 130s over 60s.  She is not feeling lightheaded, dizzy, or having syncopal episodes.   Review of Systems See HPI   Past Medical History:  Diagnosis Date   A-fib (HCC)    Anemia    Anxiety    Bipolar disorder (HCC)    Blood transfusion 1991   autologous pts own blood given    CAD (coronary artery disease)    Cataract    Chest pain    "@ rest, lying down,  w/exertion"   CHF (congestive heart failure) (HCC) 07/2020   Chronic back pain    "mostly lower back but I do have upper back pain regularly" (04/06/2018)   COPD (chronic obstructive pulmonary disease) (HCC)    Depression    Esophageal stricture    Fibromyalgia    GERD (gastroesophageal reflux disease)    Heart murmur    "slight" (04/06/2018)   Hiatal hernia    Hyperlipidemia    Patient denies   Hypertension    Internal hemorrhoids    Lumbago    Mitral regurgitation    Osteoarthritis    Osteoporosis    Pneumonia ~ 01/2018   PONV (postoperative nausea and vomiting)    severe ponv, "in the past" (04/06/2018)   Rectal bleeding    Rheumatoid arthritis (HCC)    Spondylosis    TIA (transient ischemic attack) 2013   Urinary incontinence    wears depends    Social History   Socioeconomic History   Marital status: Widowed    Spouse name: Not on file   Number of children: 2   Years of education: Not on file   Highest education level: 12th grade  Occupational History   Occupation: retired    Associate Professor: RETIRED  Tobacco Use   Smoking status: Never   Smokeless tobacco: Never  Vaping Use   Vaping Use: Never used  Substance and Sexual Activity   Alcohol use: Never   Drug use: Never   Sexual activity: Not Currently    Comment: 1st intercourse 17 yo-1 partner  Other Topics Concern  Not on file  Social History Narrative   Retired    Widowed   Right handed   Social Determinants of Health   Financial Resource Strain: [place]  (02/14/2022)   Overall Financial Resource Strain (CARDIA)    Difficulty of Paying Living Expenses: Not hard at all  Food Insecurity: No Food Insecurity (02/14/2022)   Hunger Vital Sign    Worried About Running Out of Food in the Last Year: Never true    [place] in the Last Year: Never true  Transportation Needs: No Transportation Needs (02/14/2022)   PRAPARE - Hydrologist (Medical): No    Lack of Transportation  (Non-Medical): No  Physical Activity: Inactive (02/14/2022)   Exercise Vital Sign    Days of Exercise per Week: 0 days    Minutes of Exercise per Session: 0 min  Stress: No Stress Concern Present (02/14/2022)   [place]    Feeling of Stress : Not at all  Social Connections: Moderately Integrated (02/14/2022)   Social Connection and Isolation Panel [NHANES]    Frequency of Communication with Friends and Family: More than three times a week    Frequency of Social Gatherings with Friends and Family: More than three times a week    Attends Religious Services: More than 4 times per year    Active Member of [employer] or Organizations: Yes    Attends Archivist Meetings: More than 4 times per year    Marital Status: Widowed  Intimate Partner Violence: Not At Risk (02/14/2022)   Humiliation, Afraid, Rape, and Kick questionnaire    Fear of Current or Ex-Partner: No    Emotionally Abused: No    Physically Abused: No    Sexually Abused: No    Past Surgical History:  Procedure Laterality Date   ABDOMINAL HYSTERECTOMY  1982   BACK SURGERY     BALLOON DILATION N/A 09/21/2018   Procedure: BALLOON DILATION;  Surgeon: Irene Shipper, MD;  Location: [facility];  Service: Endoscopy;  Laterality: N/A;   BALLOON DILATION N/A 08/12/2019   Procedure: BALLOON DILATION;  Surgeon: Jackquline Denmark, MD;  Location: [facility] ENDOSCOPY;  Service: Endoscopy;  Laterality: N/A;   BIOPSY  08/12/2019   Procedure: BIOPSY;  Surgeon: Jackquline Denmark, MD;  Location: [facility];  Service: Endoscopy;;   BLADDER SUSPENSION  1980's   BOTOX INJECTION N/A 09/21/2018   Procedure: BOTOX INJECTION;  Surgeon: Irene Shipper, MD;  Location: [facility];  Service: Endoscopy;  Laterality: N/A;   BOTOX INJECTION N/A 08/12/2019   Procedure: BOTOX INJECTION;  Surgeon: Jackquline Denmark, MD;  Location: [facility] ENDOSCOPY;  Service: Endoscopy;  Laterality: N/A;   CATARACT EXTRACTION W/  INTRAOCULAR LENS  IMPLANT, BILATERAL Bilateral 2010   DILATION AND CURETTAGE OF UTERUS  1961   ESOPHAGEAL MANOMETRY N/A 03/25/2018   Procedure: ESOPHAGEAL MANOMETRY (EM);  Surgeon: Mauri Pole, MD;  Location: [facility];  Service: Endoscopy;  Laterality: N/A;   ESOPHAGOGASTRODUODENOSCOPY (EGD) WITH PROPOFOL N/A 04/07/2018   Procedure: ESOPHAGOGASTRODUODENOSCOPY (EGD) WITH PROPOFOL;  Surgeon: Doran Stabler, MD;  Location: [place];  Service: Gastroenterology;  Laterality: N/A;   ESOPHAGOGASTRODUODENOSCOPY (EGD) WITH PROPOFOL N/A 09/21/2018   Procedure: ESOPHAGOGASTRODUODENOSCOPY (EGD) WITH PROPOFOL;  Surgeon: Irene Shipper, MD;  Location: [facility];  Service: Endoscopy;  Laterality: N/A;   ESOPHAGOGASTRODUODENOSCOPY (EGD) WITH PROPOFOL N/A 08/12/2019   Procedure: ESOPHAGOGASTRODUODENOSCOPY (EGD) WITH PROPOFOL;  Surgeon: Jackquline Denmark, MD;  Location: [facility] ENDOSCOPY;  Service: Endoscopy;  Laterality: N/A;   HERNIA REPAIR     HIATAL HERNIA REPAIR N/A 08/02/2016   Procedure: LAPAROSCOPIC REPAIR OF LARGE  HIATAL HERNIA;  Surgeon: Glenna Fellows, MD;  Location: [facility];  Service: General;  Laterality: N/A;   JOINT REPLACEMENT     LAPAROSCOPIC NISSEN FUNDOPLICATION N/A 08/02/2016   Procedure: LAPAROSCOPIC NISSEN FUNDOPLICATION;  Surgeon: Glenna Fellows, MD;  Location: [facility];  Service: General;  Laterality: N/A;   LUMBAR LAMINECTOMY  1990; 1994; [id number]   "I've got 2 stainless steel rods; 6 screws; 2 ray cages"took bone from right hip to put in back   RIGHT/LEFT HEART CATH AND CORONARY ANGIOGRAPHY N/A 07/26/2019   Procedure: RIGHT/LEFT HEART CATH AND CORONARY ANGIOGRAPHY;  Surgeon: Lyn Records, MD;  Location: [facility];  Service: Cardiovascular;  Laterality: N/A;   SVT ABLATION N/A 04/25/2021   Procedure: SVT ABLATION;  Surgeon: Regan Lemming, MD;  Location: [facility];  Service: Cardiovascular;  Laterality: N/A;   TEE WITHOUT CARDIOVERSION N/A 08/19/2019    Procedure: TRANSESOPHAGEAL ECHOCARDIOGRAM (TEE);  Surgeon: Lewayne Bunting, MD;  Location: [facility] ENDOSCOPY;  Service: Cardiovascular;  Laterality: N/A;   TONSILLECTOMY AND ADENOIDECTOMY  1945   TOTAL KNEE ARTHROPLASTY Right ~ 1996   TUBAL LIGATION  ~ 1976    Family History  Problem Relation Age of Onset   Kidney disease Mother    Hypertension Mother    Heart disease Father 58       die of MI at age 23   Heart disease Brother    Heart attack Brother    Suicidality Son    Other Son        MVA   Colon cancer Neg Hx    Esophageal cancer Neg Hx    Pancreatic cancer Neg Hx    Rectal cancer Neg Hx    Stomach cancer Neg Hx     Allergies  Allergen Reactions   Tape Other (See Comments)    Band aides, adhesive tape Redness and pulls skin off   Tramadol Other (See Comments)    Pt has prolonged Qtc interval of 540- cannot give tramadol per pharmacy   Morphine Itching and Rash    Flushing    Current Outpatient Medications on File Prior to Visit  Medication Sig Dispense Refill   acetaminophen (TYLENOL) 500 MG tablet Take 1,000 mg by mouth every 6 (six) hours as needed for moderate pain, headache or fever.     albuterol (VENTOLIN HFA) 108 (90 Base) MCG/ACT inhaler TAKE 2 PUFFS BY MOUTH EVERY 6 HOURS AS NEEDED FOR WHEEZE OR SHORTNESS OF BREATH 8.5 each 2   buPROPion (WELLBUTRIN XL) 300 MG 24 hr tablet Take 1 tablet (300 mg total) by mouth daily. 90 tablet 1   Carboxymethylcellulose Sodium (REFRESH TEARS OP) Place 1 drop into both eyes 2 (two) times daily.     cetirizine (ZYRTEC) 10 MG tablet Take 10 mg by mouth daily as needed for allergies.     clonazePAM (KLONOPIN) 0.5 MG tablet Take 0.5 tablets (0.25 mg total) by mouth 2 (two) times daily as needed for anxiety. 60 tablet 0   DULoxetine (CYMBALTA) 60 MG capsule TAKE 1 CAPSULE BY MOUTH EVERY DAY 90 capsule 3   ELIQUIS 5 MG TABS tablet TAKE 1 TABLET BY MOUTH TWICE A DAY 180 tablet 1   KLOR-CON M20 20 MEQ tablet TAKE 1 TABLET BY MOUTH  EVERY DAY 90 tablet 0  losartan (COZAAR) 50 MG tablet TAKE 1 TABLET BY MOUTH EVERY DAY 90 tablet 3   metoprolol tartrate (LOPRESSOR) 50 MG tablet Take 1.5 tablets (75 mg total) by mouth 2 (two) times daily. 270 tablet 3   Multiple Vitamins-Minerals (ONE-A-DAY WOMENS 50+) TABS Take 1 tablet by mouth daily.     omeprazole (PRILOSEC) 20 MG capsule TAKE 1 CAPSULE BY MOUTH EVERY DAY 90 capsule 3   pregabalin (LYRICA) 50 MG capsule TAKE 1 CAPSULE BY MOUTH EVERY DAY 90 capsule 0   sodium chloride (OCEAN) 0.65 % SOLN nasal spray Place 1 spray into both nostrils as needed for congestion.     spironolactone (ALDACTONE) 25 MG tablet Take 25 mg by mouth daily.     No current facility-administered medications on file prior to visit.    BP 130/68 (BP Location: Left Arm, Patient Position: Sitting, Cuff Size: Large)   Pulse 61   Temp (!) 97.5 F (36.4 C) (Oral)   Wt 151 lb 9.6 oz (68.8 kg)   SpO2 96%   BMI 27.73 kg/m       Objective:   Physical Exam Vitals and nursing note reviewed.  Constitutional:      Appearance: Normal appearance.  Cardiovascular:     Rate and Rhythm: Normal rate and regular rhythm.     Pulses: Normal pulses.     Heart sounds: Normal heart sounds.  Pulmonary:     Effort: Pulmonary effort is normal.     Breath sounds: Normal breath sounds.  Musculoskeletal:        General: Normal range of motion.  Skin:    General: Skin is warm and dry.  Neurological:     General: No focal deficit present.     Mental Status: She is alert and oriented to person, place, and time.  Psychiatric:        Mood and Affect: Mood normal.        Behavior: Behavior normal.        Thought Content: Thought content normal.        Judgment: Judgment normal.       Assessment & Plan:  1. Essential hypertension -Blood pressure at goal and she is not having symptoms of hypotension.  We will keep her on her current blood pressure medications.  Ideally would start using upper arm blood pressure  cuff as this is more accurate than using a wrist cuff.  She was advised of red flags and to follow-up as needed  2. Influenza vaccine needed  - Flu Vaccine QUAD High Dose(Fluad)  Shirline Frees, NP  Time spent with patient today was 31 minutes which consisted of chart review, discussing diagnosis, work up, treatment answering questions and documentation.

## 2022-07-30 NOTE — Addendum Note (Signed)
Addended by: Apolinar Junes on: 07/30/2022 06:04 PM   Modules accepted: Level of Service

## 2022-07-31 ENCOUNTER — Encounter: Payer: Self-pay | Admitting: Physician Assistant

## 2022-07-31 ENCOUNTER — Ambulatory Visit: Payer: Medicare Other | Admitting: Physician Assistant

## 2022-07-31 ENCOUNTER — Other Ambulatory Visit (INDEPENDENT_AMBULATORY_CARE_PROVIDER_SITE_OTHER): Payer: Medicare Other

## 2022-07-31 VITALS — BP 116/70 | HR 66 | Resp 18 | Ht 62.0 in | Wt 151.0 lb

## 2022-07-31 DIAGNOSIS — G609 Hereditary and idiopathic neuropathy, unspecified: Secondary | ICD-10-CM | POA: Insufficient documentation

## 2022-07-31 DIAGNOSIS — G629 Polyneuropathy, unspecified: Secondary | ICD-10-CM | POA: Diagnosis not present

## 2022-07-31 LAB — FERRITIN: Ferritin: 17.4 ng/mL (ref 10.0–291.0)

## 2022-07-31 LAB — C-REACTIVE PROTEIN: CRP: <1 mg/dL (ref 0.5–20.0)

## 2022-07-31 LAB — VITAMIN B12: Vitamin B-12: 597 pg/mL (ref 211–911)

## 2022-07-31 LAB — SEDIMENTATION RATE: Sed Rate: 53 mm/hr — ABNORMAL HIGH (ref 0–30)

## 2022-07-31 LAB — FOLATE: Folate: >23.9 ng/mL (ref >5.9)

## 2022-07-31 LAB — HEMOGLOBIN A1C: Hgb A1c MFr Bld: 5.7 % (ref 4.6–6.5)

## 2022-07-31 NOTE — Progress Notes (Signed)
Neurology clinic note  SERVICE DATE: 07/31/2022   Reason for Evaluation: Consultation requested by Tat, Eustace Quail, DO for an opinion regarding peripheral neuropathy. My final recommendations will be communicated back to the requesting physician by way of shared medical record or letter to requesting physician via [place] mail.    ASSESSMENT: Julie Rice is a 86 y.o. female who presents for evaluation of peripheral neuropathy. patient has a relevant medical history of hypertension, essential tremor, anemia, rheumatoid arthritis, fibromyalgia nonobstructive coronary artery disease, chronic systolic heart failure, fibromyalgia, chronic back pain with multiple back surgeries, BP disorder, hypertension, atrial fibrillation on AC , TIA, COPD, esophageal strictures, anxiety, depression, COPD. Neurological examination is pertinent for decreased sensation in all her toes, worse in the great toe, decreased vibration on the great toes, neuropathic pain. She has tried gabapentin which was discontinued due to worsening memory, and has been placed on Lyrica 50 mg daily. She is also on Cymbalta for mood. Able to ambulate but with some difficulty    PLAN: ESR/CRP, TSH 2.160 (05/2022), T4, ANA, B12, MMA, A1C, SPEP/UPEP,  Anemia panel   Consider increasing Lyrica to 100 mg at night  If the results are normal, and the pain is still present will consider EMG/NCS to further evaluate neuropathy  Also recommend evaluation for ABIs rule out any circulation issues a s per PCP Resume visit with Neurosurgery for chronic lower back pain  Return to clinic in  2 weeks  Decrease the tylenol consumption    HPI:   Had neuropathy for 1 year,  and she began taking tylenol and gabapentin. Tylenol currently is at 3 tabs q 6 h and d/c'd gabapentin due to memory issues with the med. She began Lyrica x 1 month, and has been on Cymbalta for the last  6 mo  Muscle bulk loss?  no  Muscle pain?  sometimes Cramps/Twitching?  not  that often "if my potassium is low" Suggestion of myotonia/difficulty relaxing after contraction?  no  Fatigable weakness? no  Does strength improve after brief exercise? she does not exercise  Able to brush hair/teeth without difficulty? Yes   Able to button shirts/use zips? Endorsed   Clumsiness/dropping grasped objects? No Can you arise from squatted position easily? I have to push on the chair   Able to get out of chair without using arms? No   Able to walk up steps easily?: Not really " I have to pick up the leg because it helps  Use an assistive device to walk?  R cane   Significant imbalance with walking? Endorsed , especially in the morning I wobble for 6-8 months   Falls?   3 x w the last year last 4 mo ago" coming out of the  front door and my foot touched the doormat  "                                  Any change in urine color, especially after exertion/physical activity? no  The patient denies  diplopia, ptosis, She complains of chronic  dysphagia with meats (GI following)  Denies  poor saliva control, dysarthria/dysphonia only when waking up , impaired mastication, facial weakness/droop. The patient denies  impaired sweating, heat or cold intolerance, excessive mucosal dryness, gastroparetic early satiety, denies constipation She has incontinence and uses pads  She has a history of low blood pressure   The patient has not  noticed any  recent skin rashes nor does she  report any constitutional symptoms like fever, night sweats, anorexia or unintentional weight loss.   ETOH use: no  Restrictive diet? no  Family history of neuropathy/myopathy/NM disease? no    Current medications being tried for the patient's symptoms include Cymbalta and Lyrica   Prior medications that have been tried: gabapentin but was getting too forgetful so dc'd 6 months ago    MEDICATIONS:  Outpatient Encounter Medications as of 07/31/2022  Medication Sig   acetaminophen (TYLENOL) 500 MG tablet Take  1,000 mg by mouth every 6 (six) hours as needed for moderate pain, headache or fever.   albuterol (VENTOLIN HFA) 108 (90 Base) MCG/ACT inhaler TAKE 2 PUFFS BY MOUTH EVERY 6 HOURS AS NEEDED FOR WHEEZE OR SHORTNESS OF BREATH   buPROPion (WELLBUTRIN XL) 300 MG 24 hr tablet Take 1 tablet (300 mg total) by mouth daily.   Carboxymethylcellulose Sodium (REFRESH TEARS OP) Place 1 drop into both eyes 2 (two) times daily.   cetirizine (ZYRTEC) 10 MG tablet Take 10 mg by mouth daily as needed for allergies.   clonazePAM (KLONOPIN) 0.5 MG tablet Take 0.5 tablets (0.25 mg total) by mouth 2 (two) times daily as needed for anxiety.   DULoxetine (CYMBALTA) 60 MG capsule TAKE 1 CAPSULE BY MOUTH EVERY DAY   ELIQUIS 5 MG TABS tablet TAKE 1 TABLET BY MOUTH TWICE A DAY   KLOR-CON M20 20 MEQ tablet TAKE 1 TABLET BY MOUTH EVERY DAY   losartan (COZAAR) 50 MG tablet TAKE 1 TABLET BY MOUTH EVERY DAY   metoprolol tartrate (LOPRESSOR) 50 MG tablet Take 1.5 tablets (75 mg total) by mouth 2 (two) times daily.   Multiple Vitamins-Minerals (ONE-A-DAY WOMENS 50+) TABS Take 1 tablet by mouth daily.   omeprazole (PRILOSEC) 20 MG capsule TAKE 1 CAPSULE BY MOUTH EVERY DAY   pregabalin (LYRICA) 50 MG capsule TAKE 1 CAPSULE BY MOUTH EVERY DAY   sodium chloride (OCEAN) 0.65 % SOLN nasal spray Place 1 spray into both nostrils as needed for congestion.   spironolactone (ALDACTONE) 25 MG tablet Take 25 mg by mouth daily.   No facility-administered encounter medications on file as of 07/31/2022.    PAST MEDICAL HISTORY: Past Medical History:  Diagnosis Date   A-fib ([place])    Anemia    Anxiety    Bipolar disorder ([place])    Blood transfusion 1991   autologous pts own blood given    CAD (coronary artery disease)    Cataract    Chest pain    "@ rest, lying down, w/exertion"   CHF (congestive heart failure) ([place]) 07/2020   Chronic back pain    "mostly lower back but I do have upper back pain regularly" (04/06/2018)   COPD (chronic  obstructive pulmonary disease) ([facility])    Depression    Esophageal stricture    Fibromyalgia    GERD (gastroesophageal reflux disease)    Heart murmur    "slight" (04/06/2018)   Hiatal hernia    Hyperlipidemia    Patient denies   Hypertension    Internal hemorrhoids    Lumbago    Mitral regurgitation    Osteoarthritis    Osteoporosis    Pneumonia ~ 01/2018   PONV (postoperative nausea and vomiting)    severe ponv, "in the past" (04/06/2018)   Rectal bleeding    Rheumatoid arthritis ([place])    Spondylosis    TIA (transient ischemic attack) 2013   Urinary incontinence    wears depends  PAST SURGICAL HISTORY: Past Surgical History:  Procedure Laterality Date   ABDOMINAL HYSTERECTOMY  1982   BACK SURGERY     BALLOON DILATION N/A 09/21/2018   Procedure: BALLOON DILATION;  Surgeon: Irene Shipper, MD;  Location: [facility];  Service: Endoscopy;  Laterality: N/A;   BALLOON DILATION N/A 08/12/2019   Procedure: BALLOON DILATION;  Surgeon: Jackquline Denmark, MD;  Location: [facility] ENDOSCOPY;  Service: Endoscopy;  Laterality: N/A;   BIOPSY  08/12/2019   Procedure: BIOPSY;  Surgeon: Jackquline Denmark, MD;  Location: [facility];  Service: Endoscopy;;   BLADDER SUSPENSION  1980's   BOTOX INJECTION N/A 09/21/2018   Procedure: BOTOX INJECTION;  Surgeon: Irene Shipper, MD;  Location: [facility];  Service: Endoscopy;  Laterality: N/A;   BOTOX INJECTION N/A 08/12/2019   Procedure: BOTOX INJECTION;  Surgeon: Jackquline Denmark, MD;  Location: [facility] ENDOSCOPY;  Service: Endoscopy;  Laterality: N/A;   CATARACT EXTRACTION W/ INTRAOCULAR LENS  IMPLANT, BILATERAL Bilateral 2010   DILATION AND CURETTAGE OF UTERUS  1961   ESOPHAGEAL MANOMETRY N/A 03/25/2018   Procedure: ESOPHAGEAL MANOMETRY (EM);  Surgeon: Mauri Pole, MD;  Location: [facility];  Service: Endoscopy;  Laterality: N/A;   ESOPHAGOGASTRODUODENOSCOPY (EGD) WITH PROPOFOL N/A 04/07/2018   Procedure: ESOPHAGOGASTRODUODENOSCOPY (EGD) WITH PROPOFOL;   Surgeon: Doran Stabler, MD;  Location: [facility];  Service: Gastroenterology;  Laterality: N/A;   ESOPHAGOGASTRODUODENOSCOPY (EGD) WITH PROPOFOL N/A 09/21/2018   Procedure: ESOPHAGOGASTRODUODENOSCOPY (EGD) WITH PROPOFOL;  Surgeon: Irene Shipper, MD;  Location: [facility];  Service: Endoscopy;  Laterality: N/A;   ESOPHAGOGASTRODUODENOSCOPY (EGD) WITH PROPOFOL N/A 08/12/2019   Procedure: ESOPHAGOGASTRODUODENOSCOPY (EGD) WITH PROPOFOL;  Surgeon: Jackquline Denmark, MD;  Location: [facility] ENDOSCOPY;  Service: Endoscopy;  Laterality: N/A;   HERNIA REPAIR     HIATAL HERNIA REPAIR N/A 08/02/2016   Procedure: LAPAROSCOPIC REPAIR OF LARGE  HIATAL HERNIA;  Surgeon: Excell Seltzer, MD;  Location: [facility];  Service: General;  Laterality: N/A;   JOINT REPLACEMENT     LAPAROSCOPIC NISSEN FUNDOPLICATION N/A 05/02/4764   Procedure: LAPAROSCOPIC NISSEN FUNDOPLICATION;  Surgeon: Excell Seltzer, MD;  Location: [facility];  Service: General;  Laterality: N/A;   [place]; 1994; [id number]   "I've got 2 stainless steel rods; 6 screws; 2 ray cages"took bone from right hip to put in back   RIGHT/LEFT HEART CATH AND CORONARY ANGIOGRAPHY N/A 07/26/2019   Procedure: RIGHT/LEFT HEART CATH AND CORONARY ANGIOGRAPHY;  Surgeon: Belva Crome, MD;  Location: [place] CV LAB;  Service: Cardiovascular;  Laterality: N/A;   SVT ABLATION N/A 04/25/2021   Procedure: SVT ABLATION;  Surgeon: Constance Haw, MD;  Location: [facility];  Service: Cardiovascular;  Laterality: N/A;   TEE WITHOUT CARDIOVERSION N/A 08/19/2019   Procedure: TRANSESOPHAGEAL ECHOCARDIOGRAM (TEE);  Surgeon: Lelon Perla, MD;  Location: [facility] ENDOSCOPY;  Service: Cardiovascular;  Laterality: N/A;   TONSILLECTOMY AND ADENOIDECTOMY  1945   TOTAL KNEE ARTHROPLASTY Right ~ [place]  ~ 1976    ALLERGIES: Allergies  Allergen Reactions   Tape Other (See Comments)    Band aides, adhesive tape Redness and pulls skin off    Tramadol Other (See Comments)    Pt has prolonged Qtc interval of 540- cannot give tramadol per pharmacy   Morphine Itching and Rash    Flushing    FAMILY HISTORY: Family History  Problem Relation Age of Onset   Kidney disease Mother    Hypertension Mother  Heart disease Father 19       die of MI at age 96   Heart disease Brother    Heart attack Brother    Suicidality Son    Other Son        MVA   Colon cancer Neg Hx    Esophageal cancer Neg Hx    Pancreatic cancer Neg Hx    Rectal cancer Neg Hx    Stomach cancer Neg Hx     SOCIAL HISTORY: Social History   Tobacco Use   Smoking status: Never   Smokeless tobacco: Never  Vaping Use   Vaping Use: Never used  Substance Use Topics   Alcohol use: Never   Drug use: Never   Social History   Social History Narrative   Retired    Widowed   Right handed     OBJECTIVE: PHYSICAL EXAM: BP 116/70   Pulse 66   Resp 18   Ht '5\' 2"'  (1.575 m)   Wt 151 lb (68.5 kg)   SpO2 96%   BMI 27.62 kg/m   General: General appearance: Awake and alert. No distress. Cooperative with exam.  Skin: No obvious rash or jaundice. HEENT: Atraumatic. Anicteric. Lungs: Non-labored breathing on room air  Heart: Regular Abdomen: Soft, non tender. Extremities: No edema. No obvious deformity.  Musculoskeletal: No obvious joint swelling. Psych: Affect appropriate.  Neurological: Mental Status: Alert. Speech fluent.  Cranial Nerves: CNII: No RAPD. Visual fields grossly intact. CNIII, IV, VI: PERRL. No nystagmus. EOMI. CN V: Facial sensation intact bilaterally to fine touch. [place] clench strong. CN VII: Facial muscles symmetric and strong. No ptosis at rest or after sustained upgaze. CN VIII: Hearing grossly intact bilaterally. CN IX: No hypophonia. CN X: Palate elevates symmetrically. CN XI: Full strength shoulder shrug bilaterally. CN XII: Tongue protrusion full and midline. No atrophy or fasciculations. No significant  dysarthria Motor: Tone is normal. No  fasciculations in B extremities. No atrophy. Postural tremor and intention tremor, mild     DTRs 1/4 B    Sensation: Pinprick: decreased on the B feet Vibration: cannot feel on B great toes, normal rest of toes  Temperature: normal  Proprioception: normal  Coordination: Intact finger-to- nose-finger bilaterally.  Posture somewhat stable Gait: Able to rise from chair with arms crossed needs to push chair . Normal, narrow-based gait, L knee severe arthritis . Unable to heel toe walk     Lab and Test Review: Pending    The impression above as well as the plan as outlined below were extensively discussed with the patient All questions were answered to their satisfaction.  The patient was counseled on pertinent fall precautions per the printed material provided today, and as noted under the "Patient Instructions" section below.   When available, results of the above investigations and possible further recommendations will be communicated to the patient via telephone/MyChart. Patient to call office if not contacted after expected testing turnaround time.   Total time spent reviewing records, interview, history/exam, documentation, and coordination of care on day of encounter:  60 min   Thank you for allowing me to participate in patient's care.  If I can answer any additional questions, I would be pleased to do so.  Sharene Butters, PA-C    CC: [name], NP [place] [id number]

## 2022-07-31 NOTE — Patient Instructions (Addendum)
Check labs today Consider increasing Lyrica to 100 mg at night  If the results are normal, and the pain is still present will consider further studies evaluate neuropathy  Also recommend evaluation for circulation issues  Resume visit with Neurosurgery for lower back pain/ PT   Return to  follow up  in 2 weeks    Your provider has requested that you have labwork completed today. Please go to [facility] ([id number]) on the second floor of this building before leaving the office today. You do not need to check in. If you are not called within 15 minutes please check with the front desk.

## 2022-08-01 LAB — IRON AND TIBC
Iron Saturation: 10 % — ABNORMAL LOW (ref 15–55)
Iron: 45 ug/dL (ref 27–139)
Total Iron Binding Capacity: 448 ug/dL (ref 250–450)
UIBC: 403 ug/dL — ABNORMAL HIGH (ref 118–369)

## 2022-08-01 NOTE — Progress Notes (Signed)
Please inform the patient that she may be iron deficient, will send the results to her PCP, as iron deficiency may cause some of her neuropathy issues.  Other labs are pending.  Thank you

## 2022-08-05 ENCOUNTER — Other Ambulatory Visit: Payer: Self-pay | Admitting: Adult Health

## 2022-08-05 ENCOUNTER — Other Ambulatory Visit: Payer: Self-pay | Admitting: Physician Assistant

## 2022-08-05 DIAGNOSIS — R0600 Dyspnea, unspecified: Secondary | ICD-10-CM

## 2022-08-05 DIAGNOSIS — F339 Major depressive disorder, recurrent, unspecified: Secondary | ICD-10-CM

## 2022-08-06 LAB — PROTEIN ELECTROPHORESIS, SERUM
Albumin ELP: 4 g/dL (ref 3.8–4.8)
Alpha 1: 0.3 g/dL (ref 0.2–0.3)
Alpha 2: 0.8 g/dL (ref 0.5–0.9)
Beta 2: 0.4 g/dL (ref 0.2–0.5)
Beta Globulin: 0.5 g/dL (ref 0.4–0.6)
Gamma Globulin: 0.8 g/dL (ref 0.8–1.7)
Total Protein: 6.8 g/dL (ref 6.1–8.1)

## 2022-08-06 LAB — IMMUNOFIXATION ELECTROPHORESIS
IgG (Immunoglobin G), Serum: 945 mg/dL (ref 600–1540)
IgM, Serum: 40 mg/dL — ABNORMAL LOW (ref 50–300)
Immunoglobulin A: 239 mg/dL (ref 70–320)

## 2022-08-06 LAB — METHYLMALONIC ACID, SERUM: Methylmalonic Acid, Quant: 348 nmol/L — ABNORMAL HIGH (ref 87–318)

## 2022-08-06 LAB — ANA: Anti Nuclear Antibody (ANA): NEGATIVE

## 2022-08-15 ENCOUNTER — Ambulatory Visit: Payer: Medicare Other | Admitting: Physician Assistant

## 2022-08-15 ENCOUNTER — Encounter: Payer: Self-pay | Admitting: Physician Assistant

## 2022-08-15 VITALS — BP 105/62 | HR 70 | Resp 20 | Ht 62.0 in | Wt 152.0 lb

## 2022-08-15 DIAGNOSIS — E538 Deficiency of other specified B group vitamins: Secondary | ICD-10-CM

## 2022-08-15 DIAGNOSIS — E611 Iron deficiency: Secondary | ICD-10-CM

## 2022-08-15 DIAGNOSIS — R233 Spontaneous ecchymoses: Secondary | ICD-10-CM | POA: Diagnosis not present

## 2022-08-15 DIAGNOSIS — R040 Epistaxis: Secondary | ICD-10-CM | POA: Diagnosis not present

## 2022-08-15 NOTE — Patient Instructions (Signed)
Referral to Hematology for easy bruising, abnormal labs, including iron deficiency and B12 deficiency  Continue Lyrica to 100 mg at night  No tylenol is bad for your liver in large quantities  Resume visit with Neurosurgery for lower back pain/ PT   Continue Cardiology follow up for congestive hear failure .  Increase the water intake Try Mustard and CoQ 10 daily  Return to  follow up  in 3 months

## 2022-08-15 NOTE — Progress Notes (Signed)
Neurology clinic note 08/15/2022  ASSESSMENT:  Julie Rice is a 86 y.o. female who presents for evaluation of peripheral neuropathy. patient has a relevant medical history of hypertension, essential tremor, anemia, rheumatoid arthritis, fibromyalgia nonobstructive coronary artery disease, chronic systolic heart failure, fibromyalgia, chronic back pain with multiple back surgeries, BP disorder, hypertension, atrial fibrillation on AC , TIA, COPD, esophageal strictures, anxiety, depression, COPD. Neurological examination is pertinent for decreased sensation in all her toes, worse in the great toe, decreased vibration on the great toes, neuropathic pain. She has tried gabapentin which was discontinued due to worsening memory, and her Lyrica has been recently increased to 100 mg daily, very mild improvement of the symptoms.   Labs are remarkable for iron saturation of 10, in MAM of 348.  Patient likely is iron deficient, as well as B12 deficient.  She does report ice chip craving (?  Pica) these can certainly contribute to her neuropathy in tingling sensation.  In addition, the patient is set to follow-up with neurosurgery, due to degenerative disc disease, possible stenosis, which also can affect her symptoms.  Of note, the patient was noted to have increased bruising.  No recent PT-INR-PTT are available for review.  However, this has been complicated by recent nosebleed with clots as well as bruising.  In view of all these hematological abnormalities, it is felt prudent that the patient be seen by hematology, for a comprehensive evaluation and treatment.    Recommendations Recommend in the interim to replenish B12 at 1000 mcg daily (MAM 348 indicative of B12 deficiency)   Continue Lyrica to 100 mg at night  Recommend iron replenishment while awaiting results Resume visit with Neurosurgery for chronic lower back pain  Return to clinic in  2 weeks  Stop excessive Tylenol consumption in view of  slightly abnormal IgM 40 ( range 49-201, inflammatory liver disease risk, possible coagulation risk in view of her recent nosebleeds and bruising).  Patient currently consumes 3000 mg every 4-6 hours for pain. In the interim she may try a teaspoon of mustard for leg cramps At this time, no indication for invasive procedures such as EMG/NCS in view of other multiple medical issues and advanced age   HPI:   Had neuropathy for 1 year,  and she began taking tylenol and gabapentin. Tylenol currently is at 3 tabs q 6 h and d/c'd gabapentin due to memory issues with the med. She began Lyrica x 1 month, and has been on Cymbalta for the last  6 mo  Muscle bulk loss?  no  Muscle pain?  sometimes Cramps/Twitching?  not that often "if my potassium is low" Suggestion of myotonia/difficulty relaxing after contraction?  no  Fatigable weakness? no  Does strength improve after brief exercise? she does not exercise  Able to brush hair/teeth without difficulty? Yes   Able to button shirts/use zips? Endorsed   Clumsiness/dropping grasped objects? No Can you arise from squatted position easily? I have to push on the chair   Able to get out of chair without using arms? No   Able to walk up steps easily?: Not really " I have to pick up the leg because it helps  Use an assistive device to walk?  R cane   Significant imbalance with walking? Endorsed , especially in the morning I wobble for 6-8 months   Falls?   3 x w the last year last 4 mo ago" coming out of the  front door and my foot touched the doormat  ".  No other falls during the last 2 weeks.                                 Any change in urine color, especially after exertion/physical activity? no  The patient denies  diplopia, ptosis, She complains of chronic  dysphagia with meats (GI following)  Denies  poor saliva control, dysarthria/dysphonia only when waking up , impaired mastication, facial weakness/droop. The patient denies  impaired sweating, heat  or cold intolerance, excessive mucosal dryness, gastroparetic early satiety, denies constipation She has incontinence and uses pads  She has a history of low blood pressure   The patient has not  noticed any recent skin rashes nor does she  report any constitutional symptoms like fever, night sweats, anorexia or unintentional weight loss.   ETOH use: no  Restrictive diet? no  Family history of neuropathy/myopathy/NM disease? no    Current medications being tried for the patient's symptoms include Cymbalta and Lyrica   Prior medications that have been tried: gabapentin but was getting too forgetful so dc'd 6 months ago   Pertinent labs 07/31/2022: Iron 45, TIBC 448, ferritin 17.4, iron saturation 10 (low), folate 23.9 (normal), CRP normal less than 1, vitamin B12 597 normal, SPEP essentially unremarkable except for serum IgM at 40, sed rate  53 (normal for age), A1c 5.7, ANA negative, IgA 239 normal, IgG normal at 945, MAM 348 elevated, TSH 2.160 (05/2022)   MEDICATIONS:  Outpatient Encounter Medications as of 08/15/2022  Medication Sig   acetaminophen (TYLENOL) 500 MG tablet Take 1,000 mg by mouth every 6 (six) hours as needed for moderate pain, headache or fever.   albuterol (VENTOLIN HFA) 108 (90 Base) MCG/ACT inhaler TAKE 2 PUFFS BY MOUTH EVERY 6 HOURS AS NEEDED FOR WHEEZE OR SHORTNESS OF BREATH   buPROPion (WELLBUTRIN XL) 300 MG 24 hr tablet TAKE 1 TABLET BY MOUTH EVERY DAY   Carboxymethylcellulose Sodium (REFRESH TEARS OP) Place 1 drop into both eyes 2 (two) times daily.   cetirizine (ZYRTEC) 10 MG tablet Take 10 mg by mouth daily as needed for allergies.   clonazePAM (KLONOPIN) 0.5 MG tablet Take 0.5 tablets (0.25 mg total) by mouth 2 (two) times daily as needed for anxiety.   DULoxetine (CYMBALTA) 60 MG capsule TAKE 1 CAPSULE BY MOUTH EVERY DAY   ELIQUIS 5 MG TABS tablet TAKE 1 TABLET BY MOUTH TWICE A DAY   KLOR-CON M20 20 MEQ tablet TAKE 1 TABLET BY MOUTH EVERY DAY   losartan  (COZAAR) 50 MG tablet TAKE 1 TABLET BY MOUTH EVERY DAY   metoprolol tartrate (LOPRESSOR) 50 MG tablet Take 1.5 tablets (75 mg total) by mouth 2 (two) times daily.   Multiple Vitamins-Minerals (ONE-A-DAY WOMENS 50+) TABS Take 1 tablet by mouth daily.   omeprazole (PRILOSEC) 20 MG capsule TAKE 1 CAPSULE BY MOUTH EVERY DAY   pregabalin (LYRICA) 50 MG capsule TAKE 1 CAPSULE BY MOUTH EVERY DAY   sodium chloride (OCEAN) 0.65 % SOLN nasal spray Place 1 spray into both nostrils as needed for congestion.   spironolactone (ALDACTONE) 25 MG tablet Take 25 mg by mouth daily.   No facility-administered encounter medications on file as of 08/15/2022.    PAST MEDICAL HISTORY: Past Medical History:  Diagnosis Date   A-fib ([place])    Anemia    Anxiety    Bipolar disorder ([place])    Blood transfusion 1991   autologous pts own blood given  CAD (coronary artery disease)    Cataract    Chest pain    "@ rest, lying down, w/exertion"   CHF (congestive heart failure) (HCC) 07/2020   Chronic back pain    "mostly lower back but I do have upper back pain regularly" (04/06/2018)   COPD (chronic obstructive pulmonary disease) (HCC)    Depression    Esophageal stricture    Fibromyalgia    GERD (gastroesophageal reflux disease)    Heart murmur    "slight" (04/06/2018)   Hiatal hernia    Hyperlipidemia    Patient denies   Hypertension    Internal hemorrhoids    Lumbago    Mitral regurgitation    Osteoarthritis    Osteoporosis    Pneumonia ~ 01/2018   PONV (postoperative nausea and vomiting)    severe ponv, "in the past" (04/06/2018)   Rectal bleeding    Rheumatoid arthritis (HCC)    Spondylosis    TIA (transient ischemic attack) 2013   Urinary incontinence    wears depends    PAST SURGICAL HISTORY: Past Surgical History:  Procedure Laterality Date   ABDOMINAL HYSTERECTOMY  1982   BACK SURGERY     BALLOON DILATION N/A 09/21/2018   Procedure: BALLOON DILATION;  Surgeon: Hilarie Fredrickson, MD;  Location:  [facility] ENDOSCOPY;  Service: Endoscopy;  Laterality: N/A;   BALLOON DILATION N/A 08/12/2019   Procedure: BALLOON DILATION;  Surgeon: Lynann Bologna, MD;  Location: [facility] ENDOSCOPY;  Service: Endoscopy;  Laterality: N/A;   BIOPSY  08/12/2019   Procedure: BIOPSY;  Surgeon: Lynann Bologna, MD;  Location: [facility] ENDOSCOPY;  Service: Endoscopy;;   BLADDER SUSPENSION  1980's   BOTOX INJECTION N/A 09/21/2018   Procedure: BOTOX INJECTION;  Surgeon: Hilarie Fredrickson, MD;  Location: [facility] ENDOSCOPY;  Service: Endoscopy;  Laterality: N/A;   BOTOX INJECTION N/A 08/12/2019   Procedure: BOTOX INJECTION;  Surgeon: Lynann Bologna, MD;  Location: [facility] ENDOSCOPY;  Service: Endoscopy;  Laterality: N/A;   CATARACT EXTRACTION W/ INTRAOCULAR LENS  IMPLANT, BILATERAL Bilateral 2010   DILATION AND CURETTAGE OF UTERUS  1961   ESOPHAGEAL MANOMETRY N/A 03/25/2018   Procedure: ESOPHAGEAL MANOMETRY (EM);  Surgeon: Napoleon Form, MD;  Location: [facility] ENDOSCOPY;  Service: Endoscopy;  Laterality: N/A;   ESOPHAGOGASTRODUODENOSCOPY (EGD) WITH PROPOFOL N/A 04/07/2018   Procedure: ESOPHAGOGASTRODUODENOSCOPY (EGD) WITH PROPOFOL;  Surgeon: Sherrilyn Rist, MD;  Location: [facility] ENDOSCOPY;  Service: Gastroenterology;  Laterality: N/A;   ESOPHAGOGASTRODUODENOSCOPY (EGD) WITH PROPOFOL N/A 09/21/2018   Procedure: ESOPHAGOGASTRODUODENOSCOPY (EGD) WITH PROPOFOL;  Surgeon: Hilarie Fredrickson, MD;  Location: [facility] ENDOSCOPY;  Service: Endoscopy;  Laterality: N/A;   ESOPHAGOGASTRODUODENOSCOPY (EGD) WITH PROPOFOL N/A 08/12/2019   Procedure: ESOPHAGOGASTRODUODENOSCOPY (EGD) WITH PROPOFOL;  Surgeon: Lynann Bologna, MD;  Location: [facility] ENDOSCOPY;  Service: Endoscopy;  Laterality: N/A;   HERNIA REPAIR     HIATAL HERNIA REPAIR N/A 08/02/2016   Procedure: LAPAROSCOPIC REPAIR OF LARGE  HIATAL HERNIA;  Surgeon: Glenna Fellows, MD;  Location: [facility] ORS;  Service: General;  Laterality: N/A;   JOINT REPLACEMENT     LAPAROSCOPIC NISSEN FUNDOPLICATION N/A 08/02/2016   Procedure: LAPAROSCOPIC  NISSEN FUNDOPLICATION;  Surgeon: Glenna Fellows, MD;  Location: [facility] ORS;  Service: General;  Laterality: N/A;   LUMBAR LAMINECTOMY  1990; 1994; [id number]   "I've got 2 stainless steel rods; 6 screws; 2 ray cages"took bone from right hip to put in back   RIGHT/LEFT HEART CATH AND CORONARY ANGIOGRAPHY N/A 07/26/2019   Procedure: RIGHT/LEFT HEART CATH AND CORONARY ANGIOGRAPHY;  Surgeon: Lyn Records, MD;  Location: [facility] INVASIVE CV LAB;  Service: Cardiovascular;  Laterality: N/A;   SVT ABLATION N/A 04/25/2021   Procedure: SVT ABLATION;  Surgeon: Regan Lemming, MD;  Location: [facility] INVASIVE CV LAB;  Service: Cardiovascular;  Laterality: N/A;   TEE WITHOUT CARDIOVERSION N/A 08/19/2019   Procedure: TRANSESOPHAGEAL ECHOCARDIOGRAM (TEE);  Surgeon: Lewayne Bunting, MD;  Location: [facility] ENDOSCOPY;  Service: Cardiovascular;  Laterality: N/A;   TONSILLECTOMY AND ADENOIDECTOMY  1945   TOTAL KNEE ARTHROPLASTY Right ~ 1996   TUBAL LIGATION  ~ 1976    ALLERGIES: Allergies  Allergen Reactions   Tape Other (See Comments)    Band aides, adhesive tape Redness and pulls skin off   Tramadol Other (See Comments)    Pt has prolonged Qtc interval of 540- cannot give tramadol per pharmacy   Morphine Itching and Rash    Flushing    FAMILY HISTORY: Family History  Problem Relation Age of Onset   Kidney disease Mother    Hypertension Mother    Heart disease Father 32       die of MI at age 25   Heart disease Brother    Heart attack Brother    Suicidality Son    Other Son        MVA   Colon cancer Neg Hx    Esophageal cancer Neg Hx    Pancreatic cancer Neg Hx    Rectal cancer Neg Hx    Stomach cancer Neg Hx     SOCIAL HISTORY: Social History   Tobacco Use   Smoking status: Never   Smokeless tobacco: Never  Vaping Use   Vaping Use: Never used  Substance Use Topics   Alcohol use: Never   Drug use: Never   Social History   Social History Narrative   Retired    Widowed   Right handed      OBJECTIVE: PHYSICAL EXAM: There were no vitals taken for this visit.  General: General appearance: Awake and alert. No distress. Cooperative with exam.  Skin: No obvious rash or jaundice. HEENT: Atraumatic. Anicteric. Lungs: Non-labored breathing on room air  Heart: Regular Abdomen: Soft, non tender. Extremities: No edema. No obvious deformity.  Musculoskeletal: No obvious joint swelling. Psych: Affect appropriate.  Neurological: Mental Status: Alert. Speech fluent.  Cranial Nerves: CNII: No RAPD. Visual fields grossly intact. CNIII, IV, VI: PERRL. No nystagmus. EOMI. CN V: Facial sensation intact bilaterally to fine touch. [place] clench strong. CN VII: Facial muscles symmetric and strong. No ptosis at rest or after sustained upgaze. CN VIII: Hearing grossly intact bilaterally. CN IX: No hypophonia. CN X: Palate elevates symmetrically. CN XI: Full strength shoulder shrug bilaterally. CN XII: Tongue protrusion full and midline. No atrophy or fasciculations. No significant dysarthria Motor: Tone is normal. No  fasciculations in B extremities. No atrophy. Postural tremor and intention tremor, mild     DTRs 1/4 B    Sensation: Pinprick: decreased on the B feet Vibration: cannot feel on B great toes, normal rest of toes  Temperature: normal  Proprioception: normal  Coordination: Intact finger-to- nose-finger bilaterally.  Posture somewhat stable Gait: Able to rise from chair with arms crossed needs to push chair . Normal, narrow-based gait, L knee severe arthritis . Unable to heel toe walk         The impression above as well as the plan as outlined below were extensively discussed with the patient All questions were answered to their satisfaction.  The  patient was counseled on pertinent fall precautions per the printed material provided today, and as noted under the "Patient Instructions" section below.   When available, results of the above investigations  and possible further recommendations will be communicated to the patient via telephone/MyChart. Patient to call office if not contacted after expected testing turnaround time.   Total time spent reviewing records, interview, history/exam, documentation, and coordination of care on day of encounter:  32 min   Thank you for allowing me to participate in patient's care.  If I can answer any additional questions, I would be pleased to do so.  Marlowe Kays, PA-C    CC: Shirline Frees, NP [address] [place] [address]

## 2022-08-16 ENCOUNTER — Telehealth: Payer: Self-pay | Admitting: Internal Medicine

## 2022-08-16 NOTE — Telephone Encounter (Signed)
Scheduled appt per 10/12 referral. Pt is aware of appt date and time. Pt is aware to arrive 15 mins prior to appt time and to bring and updated insurance card. Pt is aware of appt location.   

## 2022-08-26 ENCOUNTER — Other Ambulatory Visit: Payer: Self-pay | Admitting: Internal Medicine

## 2022-08-26 DIAGNOSIS — D539 Nutritional anemia, unspecified: Secondary | ICD-10-CM

## 2022-08-26 DIAGNOSIS — M1712 Unilateral primary osteoarthritis, left knee: Secondary | ICD-10-CM | POA: Diagnosis not present

## 2022-08-27 ENCOUNTER — Other Ambulatory Visit: Payer: Self-pay | Admitting: Adult Health

## 2022-08-27 ENCOUNTER — Inpatient Hospital Stay: Payer: Medicare Other | Attending: Internal Medicine | Admitting: Internal Medicine

## 2022-08-27 ENCOUNTER — Inpatient Hospital Stay: Payer: Medicare Other

## 2022-08-27 VITALS — BP 107/58 | HR 72 | Temp 97.9°F | Resp 16 | Wt 154.5 lb

## 2022-08-27 DIAGNOSIS — K219 Gastro-esophageal reflux disease without esophagitis: Secondary | ICD-10-CM | POA: Diagnosis not present

## 2022-08-27 DIAGNOSIS — J449 Chronic obstructive pulmonary disease, unspecified: Secondary | ICD-10-CM | POA: Diagnosis not present

## 2022-08-27 DIAGNOSIS — I340 Nonrheumatic mitral (valve) insufficiency: Secondary | ICD-10-CM | POA: Insufficient documentation

## 2022-08-27 DIAGNOSIS — I2510 Atherosclerotic heart disease of native coronary artery without angina pectoris: Secondary | ICD-10-CM | POA: Insufficient documentation

## 2022-08-27 DIAGNOSIS — D649 Anemia, unspecified: Secondary | ICD-10-CM | POA: Insufficient documentation

## 2022-08-27 DIAGNOSIS — I110 Hypertensive heart disease with heart failure: Secondary | ICD-10-CM | POA: Diagnosis not present

## 2022-08-27 DIAGNOSIS — F419 Anxiety disorder, unspecified: Secondary | ICD-10-CM | POA: Diagnosis not present

## 2022-08-27 DIAGNOSIS — D539 Nutritional anemia, unspecified: Secondary | ICD-10-CM

## 2022-08-27 DIAGNOSIS — Z79899 Other long term (current) drug therapy: Secondary | ICD-10-CM | POA: Insufficient documentation

## 2022-08-27 DIAGNOSIS — M069 Rheumatoid arthritis, unspecified: Secondary | ICD-10-CM | POA: Diagnosis not present

## 2022-08-27 DIAGNOSIS — I509 Heart failure, unspecified: Secondary | ICD-10-CM | POA: Insufficient documentation

## 2022-08-27 DIAGNOSIS — E785 Hyperlipidemia, unspecified: Secondary | ICD-10-CM | POA: Insufficient documentation

## 2022-08-27 DIAGNOSIS — R040 Epistaxis: Secondary | ICD-10-CM | POA: Diagnosis not present

## 2022-08-27 DIAGNOSIS — I4891 Unspecified atrial fibrillation: Secondary | ICD-10-CM | POA: Insufficient documentation

## 2022-08-27 DIAGNOSIS — Z7901 Long term (current) use of anticoagulants: Secondary | ICD-10-CM | POA: Diagnosis not present

## 2022-08-27 DIAGNOSIS — F319 Bipolar disorder, unspecified: Secondary | ICD-10-CM | POA: Insufficient documentation

## 2022-08-27 DIAGNOSIS — D509 Iron deficiency anemia, unspecified: Secondary | ICD-10-CM

## 2022-08-27 DIAGNOSIS — Z760 Encounter for issue of repeat prescription: Secondary | ICD-10-CM

## 2022-08-27 LAB — IRON AND IRON BINDING CAPACITY (CC-WL,HP ONLY)
Iron: 58 ug/dL (ref 28–170)
Saturation Ratios: 11 % (ref 10.4–31.8)
TIBC: 507 ug/dL — ABNORMAL HIGH (ref 250–450)
UIBC: 449 ug/dL — ABNORMAL HIGH (ref 148–442)

## 2022-08-27 LAB — CBC WITH DIFFERENTIAL (CANCER CENTER ONLY)
Abs Immature Granulocytes: 0.04 10*3/uL (ref 0.00–0.07)
Basophils Absolute: 0.1 10*3/uL (ref 0.0–0.1)
Basophils Relative: 1 %
Eosinophils Absolute: 0.2 10*3/uL (ref 0.0–0.5)
Eosinophils Relative: 2 %
HCT: 29.8 % — ABNORMAL LOW (ref 36.0–46.0)
Hemoglobin: 9.3 g/dL — ABNORMAL LOW (ref 12.0–15.0)
Immature Granulocytes: 0 %
Lymphocytes Relative: 11 %
Lymphs Abs: 1.4 10*3/uL (ref 0.7–4.0)
MCH: 29 pg (ref 26.0–34.0)
MCHC: 31.2 g/dL (ref 30.0–36.0)
MCV: 92.8 fL (ref 80.0–100.0)
Monocytes Absolute: 0.9 10*3/uL (ref 0.1–1.0)
Monocytes Relative: 7 %
Neutro Abs: 10 10*3/uL — ABNORMAL HIGH (ref 1.7–7.7)
Neutrophils Relative %: 79 %
Platelet Count: 303 10*3/uL (ref 150–400)
RBC: 3.21 MIL/uL — ABNORMAL LOW (ref 3.87–5.11)
RDW: 13.8 % (ref 11.5–15.5)
WBC Count: 12.6 10*3/uL — ABNORMAL HIGH (ref 4.0–10.5)
nRBC: 0 % (ref 0.0–0.2)

## 2022-08-27 LAB — CMP (CANCER CENTER ONLY)
ALT: 16 U/L (ref 0–44)
AST: 18 U/L (ref 15–41)
Albumin: 4 g/dL (ref 3.5–5.0)
Alkaline Phosphatase: 60 U/L (ref 38–126)
Anion gap: 7 (ref 5–15)
BUN: 33 mg/dL — ABNORMAL HIGH (ref 8–23)
CO2: 30 mmol/L (ref 22–32)
Calcium: 9.1 mg/dL (ref 8.9–10.3)
Chloride: 103 mmol/L (ref 98–111)
Creatinine: 1.19 mg/dL — ABNORMAL HIGH (ref 0.44–1.00)
GFR, Estimated: 45 mL/min — ABNORMAL LOW (ref >60)
Glucose, Bld: 82 mg/dL (ref 70–99)
Potassium: 4.7 mmol/L (ref 3.5–5.1)
Sodium: 140 mmol/L (ref 135–145)
Total Bilirubin: 0.3 mg/dL (ref 0.3–1.2)
Total Protein: 6.8 g/dL (ref 6.5–8.1)

## 2022-08-27 LAB — FERRITIN: Ferritin: 15 ng/mL (ref 11–307)

## 2022-08-27 LAB — TSH: TSH: 2.768 u[IU]/mL (ref 0.350–4.500)

## 2022-08-27 LAB — FOLATE: Folate: >40 ng/mL (ref >5.9)

## 2022-08-27 LAB — VITAMIN B12: Vitamin B-12: 421 pg/mL (ref 180–914)

## 2022-08-27 NOTE — Progress Notes (Signed)
[facility] [phone]   [phone]  CONSULT NOTE  REFERRING PHYSICIAN: Duane Lope, NP  REASON FOR CONSULTATION:  86 years old white female with persistent anemia.  HPI Julie Rice is a 86 y.o. female with past medical history significant for anxiety, bipolar disorder, atrial fibrillation, coronary artery disease, congestive heart failure, chronic back pain, COPD, depression, fibromyalgia, GERD, dyslipidemia, hypertension, hiatal hernia, osteoporosis, rheumatoid arthritis, spondylosis, TIA and mitral regurgitation.  The patient was seen by neurology for evaluation of peripheral neuropathy and during her blood work she was found to have persistent anemia suspicious for iron deficiency anemia.  She was referred to me today for further evaluation of her condition.  Her last CBC on May 14, 2022 showed hemoglobin of 10.9 and hematocrit 33.5%.  Iron study on 07/31/2022 showed iron of 45 with iron saturation of 10%.  Ferritin level was low normal at 17.4.  Serum folate and vitamin B12 level were normal.  She is not currently on any iron supplements. When seen today she continues to have some craving for ice as well as occasional dizzy spells and shortness of breath but no significant chest pain, cough or hemoptysis.  She has intermittent nosebleed especially from the left nostril.  She denied having any nausea, vomiting, diarrhea or constipation.  She has no headache or visual changes. Family history significant for father with heart disease and mother had kidney disease. The patient is a widow.  She had 2 sons but unfortunately both of them are deceased 1 in car accident and the other 1 from suicide.  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  She was accompanied today by her sister-in-law Julie Rice.  The patient has no history of smoking, alcohol or drug abuse.  HPI  Past Medical History:  Diagnosis Date   A-fib (HCC)     Anemia    Anxiety    Bipolar disorder (HCC)    Blood transfusion 1991   autologous pts own blood given    CAD (coronary artery disease)    Cataract    Chest pain    "@ rest, lying down, w/exertion"   CHF (congestive heart failure) (HCC) 07/2020   Chronic back pain    "mostly lower back but I do have upper back pain regularly" (04/06/2018)   COPD (chronic obstructive pulmonary disease) (HCC)    Depression    Esophageal stricture    Fibromyalgia    GERD (gastroesophageal reflux disease)    Heart murmur    "slight" (04/06/2018)   Hiatal hernia    Hyperlipidemia    Patient denies   Hypertension    Internal hemorrhoids    Lumbago    Mitral regurgitation    Osteoarthritis    Osteoporosis    Pneumonia ~ 01/2018   PONV (postoperative nausea and vomiting)    severe ponv, "in the past" (04/06/2018)   Rectal bleeding    Rheumatoid arthritis (HCC)    Spondylosis    TIA (transient ischemic attack) 2013   Urinary incontinence    wears depends    Past Surgical History:  Procedure Laterality Date   ABDOMINAL HYSTERECTOMY  1982   BACK SURGERY     BALLOON DILATION N/A 09/21/2018   Procedure: BALLOON DILATION;  Surgeon: Hilarie Fredrickson, MD;  Location: [facility] ENDOSCOPY;  Service: Endoscopy;  Laterality: N/A;   BALLOON DILATION N/A 08/12/2019   Procedure: BALLOON DILATION;  Surgeon: Lynann Bologna, MD;  Location: [facility]  ENDOSCOPY;  Service: Endoscopy;  Laterality: N/A;   BIOPSY  08/12/2019   Procedure: BIOPSY;  Surgeon: Lynann Bologna, MD;  Location: [facility] ENDOSCOPY;  Service: Endoscopy;;   BLADDER SUSPENSION  1980's   BOTOX INJECTION N/A 09/21/2018   Procedure: BOTOX INJECTION;  Surgeon: Hilarie Fredrickson, MD;  Location: [facility] ENDOSCOPY;  Service: Endoscopy;  Laterality: N/A;   BOTOX INJECTION N/A 08/12/2019   Procedure: BOTOX INJECTION;  Surgeon: Lynann Bologna, MD;  Location: [facility] ENDOSCOPY;  Service: Endoscopy;  Laterality: N/A;   CATARACT EXTRACTION W/ INTRAOCULAR LENS  IMPLANT, BILATERAL Bilateral 2010    DILATION AND CURETTAGE OF UTERUS  1961   ESOPHAGEAL MANOMETRY N/A 03/25/2018   Procedure: ESOPHAGEAL MANOMETRY (EM);  Surgeon: Napoleon Form, MD;  Location: [facility] ENDOSCOPY;  Service: Endoscopy;  Laterality: N/A;   ESOPHAGOGASTRODUODENOSCOPY (EGD) WITH PROPOFOL N/A 04/07/2018   Procedure: ESOPHAGOGASTRODUODENOSCOPY (EGD) WITH PROPOFOL;  Surgeon: Sherrilyn Rist, MD;  Location: [facility] ENDOSCOPY;  Service: Gastroenterology;  Laterality: N/A;   ESOPHAGOGASTRODUODENOSCOPY (EGD) WITH PROPOFOL N/A 09/21/2018   Procedure: ESOPHAGOGASTRODUODENOSCOPY (EGD) WITH PROPOFOL;  Surgeon: Hilarie Fredrickson, MD;  Location: [facility] ENDOSCOPY;  Service: Endoscopy;  Laterality: N/A;   ESOPHAGOGASTRODUODENOSCOPY (EGD) WITH PROPOFOL N/A 08/12/2019   Procedure: ESOPHAGOGASTRODUODENOSCOPY (EGD) WITH PROPOFOL;  Surgeon: Lynann Bologna, MD;  Location: [facility] ENDOSCOPY;  Service: Endoscopy;  Laterality: N/A;   HERNIA REPAIR     HIATAL HERNIA REPAIR N/A 08/02/2016   Procedure: LAPAROSCOPIC REPAIR OF LARGE  HIATAL HERNIA;  Surgeon: Glenna Fellows, MD;  Location: [facility] ORS;  Service: General;  Laterality: N/A;   JOINT REPLACEMENT     LAPAROSCOPIC NISSEN FUNDOPLICATION N/A 08/02/2016   Procedure: LAPAROSCOPIC NISSEN FUNDOPLICATION;  Surgeon: Glenna Fellows, MD;  Location: [facility] ORS;  Service: General;  Laterality: N/A;   LUMBAR LAMINECTOMY  1990; 1994; [id number]   "I've got 2 stainless steel rods; 6 screws; 2 ray cages"took bone from right hip to put in back   RIGHT/LEFT HEART CATH AND CORONARY ANGIOGRAPHY N/A 07/26/2019   Procedure: RIGHT/LEFT HEART CATH AND CORONARY ANGIOGRAPHY;  Surgeon: Lyn Records, MD;  Location: [facility] INVASIVE CV LAB;  Service: Cardiovascular;  Laterality: N/A;   SVT ABLATION N/A 04/25/2021   Procedure: SVT ABLATION;  Surgeon: Regan Lemming, MD;  Location: [facility] INVASIVE CV LAB;  Service: Cardiovascular;  Laterality: N/A;   TEE WITHOUT CARDIOVERSION N/A 08/19/2019   Procedure: TRANSESOPHAGEAL ECHOCARDIOGRAM (TEE);   Surgeon: Lewayne Bunting, MD;  Location: [facility] ENDOSCOPY;  Service: Cardiovascular;  Laterality: N/A;   TONSILLECTOMY AND ADENOIDECTOMY  1945   TOTAL KNEE ARTHROPLASTY Right ~ 1996   TUBAL LIGATION  ~ 1976    Family History  Problem Relation Age of Onset   Kidney disease Mother    Hypertension Mother    Heart disease Father 1       die of MI at age 65   Heart disease Brother    Heart attack Brother    Suicidality Son    Other Son        MVA   Colon cancer Neg Hx    Esophageal cancer Neg Hx    Pancreatic cancer Neg Hx    Rectal cancer Neg Hx    Stomach cancer Neg Hx     Social History Social History   Tobacco Use   Smoking status: Never   Smokeless tobacco: Never  Vaping Use   Vaping Use: Never used  Substance Use Topics   Alcohol use: Never   Drug use: Never  Allergies  Allergen Reactions   Tape Other (See Comments)    Band aides, adhesive tape Redness and pulls skin off   Tramadol Other (See Comments)    Pt has prolonged Qtc interval of 540- cannot give tramadol per pharmacy   Morphine Itching and Rash    Flushing    Current Outpatient Medications  Medication Sig Dispense Refill   acetaminophen (TYLENOL) 500 MG tablet Take 1,000 mg by mouth every 6 (six) hours as needed for moderate pain, headache or fever.     albuterol (VENTOLIN HFA) 108 (90 Base) MCG/ACT inhaler TAKE 2 PUFFS BY MOUTH EVERY 6 HOURS AS NEEDED FOR WHEEZE OR SHORTNESS OF BREATH 8.5 each 2   buPROPion (WELLBUTRIN XL) 300 MG 24 hr tablet TAKE 1 TABLET BY MOUTH EVERY DAY 90 tablet 1   Carboxymethylcellulose Sodium (REFRESH TEARS OP) Place 1 drop into both eyes 2 (two) times daily.     cetirizine (ZYRTEC) 10 MG tablet Take 10 mg by mouth daily as needed for allergies.     clonazePAM (KLONOPIN) 0.5 MG tablet Take 0.5 tablets (0.25 mg total) by mouth 2 (two) times daily as needed for anxiety. 60 tablet 0   DULoxetine (CYMBALTA) 60 MG capsule TAKE 1 CAPSULE BY MOUTH EVERY DAY 90 capsule 3   ELIQUIS  5 MG TABS tablet TAKE 1 TABLET BY MOUTH TWICE A DAY 180 tablet 1   KLOR-CON M20 20 MEQ tablet TAKE 1 TABLET BY MOUTH EVERY DAY 90 tablet 2   losartan (COZAAR) 50 MG tablet TAKE 1 TABLET BY MOUTH EVERY DAY 90 tablet 3   metoprolol tartrate (LOPRESSOR) 50 MG tablet Take 1.5 tablets (75 mg total) by mouth 2 (two) times daily. 270 tablet 3   Multiple Vitamins-Minerals (ONE-A-DAY WOMENS 50+) TABS Take 1 tablet by mouth daily.     omeprazole (PRILOSEC) 20 MG capsule TAKE 1 CAPSULE BY MOUTH EVERY DAY 90 capsule 3   pregabalin (LYRICA) 50 MG capsule TAKE 1 CAPSULE BY MOUTH EVERY DAY 90 capsule 0   sodium chloride (OCEAN) 0.65 % SOLN nasal spray Place 1 spray into both nostrils as needed for congestion.     spironolactone (ALDACTONE) 25 MG tablet Take 25 mg by mouth daily.     No current facility-administered medications for this visit.    Review of Systems  Constitutional: positive for fatigue Eyes: negative Ears, nose, mouth, throat, and face: positive for epistaxis Respiratory: positive for dyspnea on exertion Cardiovascular: negative Gastrointestinal: negative Genitourinary:negative Integument/breast: negative Hematologic/lymphatic: negative Musculoskeletal:positive for arthralgias Neurological: positive for dizziness Behavioral/Psych: negative Endocrine: negative Allergic/Immunologic: negative  Physical Exam  GGY:IRSWN, healthy, no distress, well nourished, and well developed SKIN: skin color, texture, turgor are normal, no rashes or significant lesions HEAD: Normocephalic, No masses, lesions, tenderness or abnormalities EYES: normal, PERRLA, Conjunctiva are pink and non-injected EARS: External ears normal, Canals clear OROPHARYNX:no exudate, no erythema, and lips, buccal mucosa, and tongue normal  NECK: supple, no adenopathy, no JVD LYMPH:  no palpable lymphadenopathy, no hepatosplenomegaly BREAST:not examined LUNGS: clear to auscultation , and palpation HEART: regular rate &  rhythm, no murmurs, and no gallops ABDOMEN:abdomen soft, non-tender, normal bowel sounds, and no masses or organomegaly BACK: Back symmetric, no curvature., No CVA tenderness EXTREMITIES:no joint deformities, effusion, or inflammation, no edema  NEURO: alert & oriented x 3 with fluent speech, no focal motor/sensory deficits  PERFORMANCE STATUS: ECOG 1-2  LABORATORY DATA: Lab Results  Component Value Date   WBC 10.5 05/14/2022   HGB 10.9 (L) 05/14/2022  HCT 33.5 (L) 05/14/2022   MCV 88 05/14/2022   PLT 334 05/14/2022      Chemistry      Component Value Date/Time   NA 142 06/14/2022 1441   K 4.4 06/14/2022 1441   CL 99 06/14/2022 1441   CO2 27 06/14/2022 1441   BUN 26 06/14/2022 1441   CREATININE 0.97 06/14/2022 1441      Component Value Date/Time   CALCIUM 9.0 06/14/2022 1441   ALKPHOS 52 02/18/2022 1217   AST 21 02/18/2022 1217   ALT 19 02/18/2022 1217   BILITOT 0.8 02/18/2022 1217   BILITOT 0.3 12/29/2019 1008       RADIOGRAPHIC STUDIES: No results found.  ASSESSMENT: This is a very pleasant 86 years old white female with normocytic anemia likely anemia of chronic disease +/- iron deficiency.  PLAN: I had a lengthy discussion with the patient and her sister-in-law today about her current condition and further investigation to identify the etiology of her anemia as well as possible treatment options. I ordered several studies today including repeat CBC that showed hemoglobin of 9.3 and hematocrit 29.8% with normal MCV of 92.8.  She had elevated white blood count of 12.6.  The patient has normal platelets count.  Comprehensive metabolic panel is unremarkable except for slightly elevated serum creatinine and BUN.  Iron study showed normal serum iron of 58 with iron saturation low normal at 11% with elevated TIBC of 507.  Ferritin level, serum folate, vitamin B12, serum protein electrophoresis with immunofixation, TSH as well as heavy metals are still pending. I will send  a prescription of Integra +1 capsule p.o. daily to her pharmacy but if the ferritin level is very low, I may consider her for iron infusion. I will see the patient back for follow-up visit in 3 months with repeat CBC, iron study and ferritin unless there is any concerning abnormality on the pending blood work that need sooner action. The patient and her sister-in-law are in agreement with the current plan. She was advised to call immediately if she has any other concerning symptoms in the interval.  The patient voices understanding of current disease status and treatment options and is in agreement with the current care plan.  All questions were answered. The patient knows to call the clinic with any problems, questions or concerns. We can certainly see the patient much sooner if necessary.  Thank you so much for allowing me to participate in the care of Julie Rice. I will continue to follow up the patient with you and assist in her care.  The total time spent in the appointment was 60 minutes.  Disclaimer: This note was dictated with voice recognition software. Similar sounding words can inadvertently be transcribed and may not be corrected upon review.   Lajuana Matte August 27, 2022, 11:56 AM

## 2022-08-28 NOTE — Telephone Encounter (Signed)
Okay for refill?  

## 2022-08-29 ENCOUNTER — Encounter: Payer: Self-pay | Admitting: Internal Medicine

## 2022-08-29 LAB — PROTEIN ELECTROPHORESIS, SERUM, WITH REFLEX
A/G Ratio: 1.2 (ref 0.7–1.7)
Albumin ELP: 3.6 g/dL (ref 2.9–4.4)
Alpha-1-Globulin: 0.3 g/dL (ref 0.0–0.4)
Alpha-2-Globulin: 0.8 g/dL (ref 0.4–1.0)
Beta Globulin: 1.1 g/dL (ref 0.7–1.3)
Gamma Globulin: 0.8 g/dL (ref 0.4–1.8)
Globulin, Total: 2.9 g/dL (ref 2.2–3.9)
Total Protein ELP: 6.5 g/dL (ref 6.0–8.5)

## 2022-08-30 ENCOUNTER — Other Ambulatory Visit: Payer: Self-pay | Admitting: Physician Assistant

## 2022-08-30 ENCOUNTER — Telehealth: Payer: Self-pay

## 2022-08-30 DIAGNOSIS — D509 Iron deficiency anemia, unspecified: Secondary | ICD-10-CM

## 2022-08-30 MED ORDER — INTEGRA PLUS PO CAPS: 1.0000 | ORAL_CAPSULE | Freq: Every morning | ORAL | 2 refills | Status: DC

## 2022-08-30 NOTE — Telephone Encounter (Signed)
Called pt in response to my chart message regarding tremors. Patient states that she does take Klonopin for anxiety. She reports that she has experienced these tremors in the past. She is feeling "shaky" and reports not having anything to eat today except for a few crackers. Patient reports being lonely and not having much support.   Explained that low ferritin levels can be related to fatigue and light headedness. Encouraged patient to increase her calorie and fluids consumption.

## 2022-08-31 LAB — HEAVY METALS, BLOOD
Arsenic: 2 ug/L (ref 0–9)
Lead: <1 ug/dL (ref 0.0–3.4)
Mercury: <1 ug/L (ref 0.0–14.9)

## 2022-09-01 ENCOUNTER — Encounter: Payer: Self-pay | Admitting: Adult Health

## 2022-09-03 ENCOUNTER — Other Ambulatory Visit: Payer: Self-pay | Admitting: Adult Health

## 2022-09-03 DIAGNOSIS — G6289 Other specified polyneuropathies: Secondary | ICD-10-CM

## 2022-09-05 ENCOUNTER — Other Ambulatory Visit: Payer: Self-pay | Admitting: Adult Health

## 2022-09-05 DIAGNOSIS — Z760 Encounter for issue of repeat prescription: Secondary | ICD-10-CM

## 2022-09-05 NOTE — Telephone Encounter (Signed)
Okay for refill?  

## 2022-09-07 ENCOUNTER — Other Ambulatory Visit: Payer: Self-pay | Admitting: Physician Assistant

## 2022-09-07 ENCOUNTER — Other Ambulatory Visit: Payer: Self-pay | Admitting: Physical Medicine and Rehabilitation

## 2022-09-07 DIAGNOSIS — D509 Iron deficiency anemia, unspecified: Secondary | ICD-10-CM

## 2022-09-08 NOTE — Telephone Encounter (Signed)
Duplicate

## 2022-09-11 ENCOUNTER — Telehealth: Payer: Self-pay | Admitting: Adult Health

## 2022-09-11 ENCOUNTER — Other Ambulatory Visit: Payer: Self-pay | Admitting: Adult Health

## 2022-09-11 MED ORDER — CLONAZEPAM 0.5 MG PO TABS: 0.2500 mg | ORAL_TABLET | Freq: Two times a day (BID) | ORAL | 2 refills | Status: DC | PRN

## 2022-09-11 NOTE — Telephone Encounter (Signed)
Danica rn triage nurse will call the pharm to get clarification and she will call us back

## 2022-09-11 NOTE — Telephone Encounter (Signed)
Pt is calling and need a refill on clonazePAM (KLONOPIN) 0.5 MG tablet . Pt said she is out and pharm per pt told her if will change ?mg . Please advise

## 2022-09-11 NOTE — Telephone Encounter (Signed)
Danica rn called the pharm and the pt did not split 1 mg in half she was taking 1 mg twice a day instead and pharm can not  refill 0.5 mg. Pt will need a new rx with different directions if provider wants he can prescribe 0.25 mg for pt .

## 2022-09-11 NOTE — Telephone Encounter (Signed)
Please advise 

## 2022-09-11 NOTE — Telephone Encounter (Signed)
Patient notified of update  and verbalized understanding. 

## 2022-09-17 ENCOUNTER — Ambulatory Visit: Payer: Medicare Other | Admitting: Physician Assistant

## 2022-09-22 ENCOUNTER — Emergency Department (HOSPITAL_COMMUNITY): Payer: Medicare Other

## 2022-09-22 ENCOUNTER — Encounter (HOSPITAL_COMMUNITY): Payer: Self-pay | Admitting: Emergency Medicine

## 2022-09-22 ENCOUNTER — Inpatient Hospital Stay (HOSPITAL_COMMUNITY)
Admission: EM | Admit: 2022-09-22 | Discharge: 2022-09-29 | DRG: 065 | Disposition: A | Discharge status: Skilled Nursing Facility | Payer: Medicare Other | Attending: Internal Medicine | Admitting: Internal Medicine

## 2022-09-22 ENCOUNTER — Other Ambulatory Visit: Payer: Self-pay

## 2022-09-22 DIAGNOSIS — K222 Esophageal obstruction: Secondary | ICD-10-CM

## 2022-09-22 DIAGNOSIS — F418 Other specified anxiety disorders: Secondary | ICD-10-CM | POA: Diagnosis not present

## 2022-09-22 DIAGNOSIS — G609 Hereditary and idiopathic neuropathy, unspecified: Secondary | ICD-10-CM | POA: Diagnosis present

## 2022-09-22 DIAGNOSIS — I340 Nonrheumatic mitral (valve) insufficiency: Secondary | ICD-10-CM | POA: Diagnosis not present

## 2022-09-22 DIAGNOSIS — I499 Cardiac arrhythmia, unspecified: Secondary | ICD-10-CM | POA: Diagnosis not present

## 2022-09-22 DIAGNOSIS — Z7901 Long term (current) use of anticoagulants: Secondary | ICD-10-CM | POA: Diagnosis not present

## 2022-09-22 DIAGNOSIS — I6623 Occlusion and stenosis of bilateral posterior cerebral arteries: Secondary | ICD-10-CM | POA: Diagnosis not present

## 2022-09-22 DIAGNOSIS — Z8673 Personal history of transient ischemic attack (TIA), and cerebral infarction without residual deficits: Secondary | ICD-10-CM | POA: Diagnosis not present

## 2022-09-22 DIAGNOSIS — I4719 Other supraventricular tachycardia: Secondary | ICD-10-CM | POA: Diagnosis present

## 2022-09-22 DIAGNOSIS — I10 Essential (primary) hypertension: Secondary | ICD-10-CM | POA: Diagnosis not present

## 2022-09-22 DIAGNOSIS — J439 Emphysema, unspecified: Secondary | ICD-10-CM | POA: Diagnosis not present

## 2022-09-22 DIAGNOSIS — I110 Hypertensive heart disease with heart failure: Secondary | ICD-10-CM | POA: Diagnosis present

## 2022-09-22 DIAGNOSIS — Z1152 Encounter for screening for COVID-19: Secondary | ICD-10-CM

## 2022-09-22 DIAGNOSIS — J9611 Chronic respiratory failure with hypoxia: Secondary | ICD-10-CM | POA: Diagnosis present

## 2022-09-22 DIAGNOSIS — M316 Other giant cell arteritis: Secondary | ICD-10-CM | POA: Diagnosis not present

## 2022-09-22 DIAGNOSIS — I4891 Unspecified atrial fibrillation: Secondary | ICD-10-CM | POA: Diagnosis not present

## 2022-09-22 DIAGNOSIS — K220 Achalasia of cardia: Secondary | ICD-10-CM | POA: Diagnosis not present

## 2022-09-22 DIAGNOSIS — R531 Weakness: Secondary | ICD-10-CM

## 2022-09-22 DIAGNOSIS — E785 Hyperlipidemia, unspecified: Secondary | ICD-10-CM | POA: Diagnosis not present

## 2022-09-22 DIAGNOSIS — R1314 Dysphagia, pharyngoesophageal phase: Secondary | ICD-10-CM | POA: Diagnosis present

## 2022-09-22 DIAGNOSIS — F411 Generalized anxiety disorder: Secondary | ICD-10-CM | POA: Diagnosis present

## 2022-09-22 DIAGNOSIS — I491 Atrial premature depolarization: Secondary | ICD-10-CM | POA: Diagnosis not present

## 2022-09-22 DIAGNOSIS — I2721 Secondary pulmonary arterial hypertension: Secondary | ICD-10-CM | POA: Diagnosis not present

## 2022-09-22 DIAGNOSIS — I700 Atherosclerosis of aorta: Secondary | ICD-10-CM | POA: Diagnosis not present

## 2022-09-22 DIAGNOSIS — I2510 Atherosclerotic heart disease of native coronary artery without angina pectoris: Secondary | ICD-10-CM | POA: Diagnosis present

## 2022-09-22 DIAGNOSIS — Z885 Allergy status to narcotic agent status: Secondary | ICD-10-CM

## 2022-09-22 DIAGNOSIS — Z90710 Acquired absence of both cervix and uterus: Secondary | ICD-10-CM

## 2022-09-22 DIAGNOSIS — K2100 Gastro-esophageal reflux disease with esophagitis, without bleeding: Secondary | ICD-10-CM | POA: Diagnosis not present

## 2022-09-22 DIAGNOSIS — K209 Esophagitis, unspecified without bleeding: Secondary | ICD-10-CM | POA: Diagnosis not present

## 2022-09-22 DIAGNOSIS — I447 Left bundle-branch block, unspecified: Secondary | ICD-10-CM | POA: Diagnosis not present

## 2022-09-22 DIAGNOSIS — I498 Other specified cardiac arrhythmias: Secondary | ICD-10-CM | POA: Diagnosis not present

## 2022-09-22 DIAGNOSIS — J449 Chronic obstructive pulmonary disease, unspecified: Secondary | ICD-10-CM | POA: Diagnosis not present

## 2022-09-22 DIAGNOSIS — R9431 Abnormal electrocardiogram [ECG] [EKG]: Secondary | ICD-10-CM | POA: Diagnosis present

## 2022-09-22 DIAGNOSIS — M545 Low back pain, unspecified: Secondary | ICD-10-CM | POA: Diagnosis present

## 2022-09-22 DIAGNOSIS — R109 Unspecified abdominal pain: Secondary | ICD-10-CM | POA: Diagnosis not present

## 2022-09-22 DIAGNOSIS — I480 Paroxysmal atrial fibrillation: Secondary | ICD-10-CM | POA: Diagnosis present

## 2022-09-22 DIAGNOSIS — E039 Hypothyroidism, unspecified: Secondary | ICD-10-CM | POA: Diagnosis present

## 2022-09-22 DIAGNOSIS — I63442 Cerebral infarction due to embolism of left cerebellar artery: Secondary | ICD-10-CM | POA: Diagnosis not present

## 2022-09-22 DIAGNOSIS — K2289 Other specified disease of esophagus: Secondary | ICD-10-CM | POA: Diagnosis not present

## 2022-09-22 DIAGNOSIS — Z7401 Bed confinement status: Secondary | ICD-10-CM | POA: Diagnosis not present

## 2022-09-22 DIAGNOSIS — D649 Anemia, unspecified: Secondary | ICD-10-CM | POA: Diagnosis present

## 2022-09-22 DIAGNOSIS — M069 Rheumatoid arthritis, unspecified: Secondary | ICD-10-CM | POA: Diagnosis not present

## 2022-09-22 DIAGNOSIS — R5383 Other fatigue: Secondary | ICD-10-CM | POA: Diagnosis not present

## 2022-09-22 DIAGNOSIS — M797 Fibromyalgia: Secondary | ICD-10-CM | POA: Diagnosis present

## 2022-09-22 DIAGNOSIS — I25118 Atherosclerotic heart disease of native coronary artery with other forms of angina pectoris: Secondary | ICD-10-CM | POA: Diagnosis not present

## 2022-09-22 DIAGNOSIS — F339 Major depressive disorder, recurrent, unspecified: Secondary | ICD-10-CM | POA: Diagnosis present

## 2022-09-22 DIAGNOSIS — I509 Heart failure, unspecified: Secondary | ICD-10-CM | POA: Diagnosis not present

## 2022-09-22 DIAGNOSIS — Z8249 Family history of ischemic heart disease and other diseases of the circulatory system: Secondary | ICD-10-CM

## 2022-09-22 DIAGNOSIS — G6289 Other specified polyneuropathies: Secondary | ICD-10-CM

## 2022-09-22 DIAGNOSIS — I6389 Other cerebral infarction: Secondary | ICD-10-CM | POA: Diagnosis not present

## 2022-09-22 DIAGNOSIS — I493 Ventricular premature depolarization: Secondary | ICD-10-CM | POA: Diagnosis present

## 2022-09-22 DIAGNOSIS — I5033 Acute on chronic diastolic (congestive) heart failure: Principal | ICD-10-CM

## 2022-09-22 DIAGNOSIS — Z888 Allergy status to other drugs, medicaments and biological substances status: Secondary | ICD-10-CM

## 2022-09-22 DIAGNOSIS — N179 Acute kidney failure, unspecified: Secondary | ICD-10-CM | POA: Diagnosis not present

## 2022-09-22 DIAGNOSIS — R29700 NIHSS score 0: Secondary | ICD-10-CM | POA: Diagnosis present

## 2022-09-22 DIAGNOSIS — I471 Supraventricular tachycardia, unspecified: Secondary | ICD-10-CM | POA: Diagnosis not present

## 2022-09-22 DIAGNOSIS — M7918 Myalgia, other site: Secondary | ICD-10-CM | POA: Diagnosis present

## 2022-09-22 DIAGNOSIS — R0600 Dyspnea, unspecified: Secondary | ICD-10-CM | POA: Diagnosis not present

## 2022-09-22 DIAGNOSIS — Z841 Family history of disorders of kidney and ureter: Secondary | ICD-10-CM

## 2022-09-22 DIAGNOSIS — K449 Diaphragmatic hernia without obstruction or gangrene: Secondary | ICD-10-CM | POA: Diagnosis not present

## 2022-09-22 DIAGNOSIS — I5030 Unspecified diastolic (congestive) heart failure: Secondary | ICD-10-CM | POA: Diagnosis present

## 2022-09-22 DIAGNOSIS — I639 Cerebral infarction, unspecified: Secondary | ICD-10-CM | POA: Diagnosis not present

## 2022-09-22 DIAGNOSIS — M810 Age-related osteoporosis without current pathological fracture: Secondary | ICD-10-CM | POA: Diagnosis present

## 2022-09-22 DIAGNOSIS — R0609 Other forms of dyspnea: Secondary | ICD-10-CM

## 2022-09-22 DIAGNOSIS — R0789 Other chest pain: Secondary | ICD-10-CM | POA: Diagnosis not present

## 2022-09-22 DIAGNOSIS — R079 Chest pain, unspecified: Secondary | ICD-10-CM | POA: Diagnosis not present

## 2022-09-22 DIAGNOSIS — I672 Cerebral atherosclerosis: Secondary | ICD-10-CM | POA: Diagnosis not present

## 2022-09-22 DIAGNOSIS — I5032 Chronic diastolic (congestive) heart failure: Secondary | ICD-10-CM | POA: Diagnosis present

## 2022-09-22 DIAGNOSIS — K224 Dyskinesia of esophagus: Secondary | ICD-10-CM | POA: Diagnosis not present

## 2022-09-22 DIAGNOSIS — Z79899 Other long term (current) drug therapy: Secondary | ICD-10-CM

## 2022-09-22 DIAGNOSIS — Z96651 Presence of right artificial knee joint: Secondary | ICD-10-CM | POA: Diagnosis present

## 2022-09-22 DIAGNOSIS — G894 Chronic pain syndrome: Secondary | ICD-10-CM | POA: Diagnosis present

## 2022-09-22 DIAGNOSIS — H538 Other visual disturbances: Secondary | ICD-10-CM | POA: Diagnosis present

## 2022-09-22 DIAGNOSIS — F3340 Major depressive disorder, recurrent, in remission, unspecified: Secondary | ICD-10-CM | POA: Diagnosis not present

## 2022-09-22 DIAGNOSIS — Z9981 Dependence on supplemental oxygen: Secondary | ICD-10-CM

## 2022-09-22 DIAGNOSIS — I6523 Occlusion and stenosis of bilateral carotid arteries: Secondary | ICD-10-CM | POA: Diagnosis not present

## 2022-09-22 LAB — CBC WITH DIFFERENTIAL/PLATELET
Abs Immature Granulocytes: 0.06 10*3/uL (ref 0.00–0.07)
Basophils Absolute: 0.1 10*3/uL (ref 0.0–0.1)
Basophils Relative: 0 %
Eosinophils Absolute: 0.1 10*3/uL (ref 0.0–0.5)
Eosinophils Relative: 1 %
HCT: 30.5 % — ABNORMAL LOW (ref 36.0–46.0)
Hemoglobin: 9.8 g/dL — ABNORMAL LOW (ref 12.0–15.0)
Immature Granulocytes: 1 %
Lymphocytes Relative: 10 %
Lymphs Abs: 1.3 10*3/uL (ref 0.7–4.0)
MCH: 29.9 pg (ref 26.0–34.0)
MCHC: 32.1 g/dL (ref 30.0–36.0)
MCV: 93 fL (ref 80.0–100.0)
Monocytes Absolute: 0.9 10*3/uL (ref 0.1–1.0)
Monocytes Relative: 7 %
Neutro Abs: 10.2 10*3/uL — ABNORMAL HIGH (ref 1.7–7.7)
Neutrophils Relative %: 81 %
Platelets: 339 10*3/uL (ref 150–400)
RBC: 3.28 MIL/uL — ABNORMAL LOW (ref 3.87–5.11)
RDW: 15 % (ref 11.5–15.5)
WBC: 12.5 10*3/uL — ABNORMAL HIGH (ref 4.0–10.5)
nRBC: 0 % (ref 0.0–0.2)

## 2022-09-22 LAB — TROPONIN I (HIGH SENSITIVITY)
Troponin I (High Sensitivity): 9 ng/L (ref <18)
Troponin I (High Sensitivity): 11 ng/L (ref <18)

## 2022-09-22 LAB — COMPREHENSIVE METABOLIC PANEL
ALT: 15 U/L (ref 0–44)
AST: 17 U/L (ref 15–41)
Albumin: 3.1 g/dL — ABNORMAL LOW (ref 3.5–5.0)
Alkaline Phosphatase: 64 U/L (ref 38–126)
Anion gap: 11 (ref 5–15)
BUN: 12 mg/dL (ref 8–23)
CO2: 26 mmol/L (ref 22–32)
Calcium: 8.9 mg/dL (ref 8.9–10.3)
Chloride: 101 mmol/L (ref 98–111)
Creatinine, Ser: 0.89 mg/dL (ref 0.44–1.00)
GFR, Estimated: >60 mL/min (ref >60)
Glucose, Bld: 102 mg/dL — ABNORMAL HIGH (ref 70–99)
Potassium: 3.5 mmol/L (ref 3.5–5.1)
Sodium: 138 mmol/L (ref 135–145)
Total Bilirubin: 0.6 mg/dL (ref 0.3–1.2)
Total Protein: 5.8 g/dL — ABNORMAL LOW (ref 6.5–8.1)

## 2022-09-22 LAB — I-STAT VENOUS BLOOD GAS, ED
Acid-Base Excess: 3 mmol/L — ABNORMAL HIGH (ref 0.0–2.0)
Bicarbonate: 26.7 mmol/L (ref 20.0–28.0)
Calcium, Ion: 1.11 mmol/L — ABNORMAL LOW (ref 1.15–1.40)
HCT: 30 % — ABNORMAL LOW (ref 36.0–46.0)
Hemoglobin: 10.2 g/dL — ABNORMAL LOW (ref 12.0–15.0)
O2 Saturation: 62 %
Potassium: 3.5 mmol/L (ref 3.5–5.1)
Sodium: 137 mmol/L (ref 135–145)
TCO2: 28 mmol/L (ref 22–32)
pCO2, Ven: 37.6 mmHg — ABNORMAL LOW (ref 44–60)
pH, Ven: 7.46 — ABNORMAL HIGH (ref 7.25–7.43)
pO2, Ven: 30 mmHg — CL (ref 32–45)

## 2022-09-22 LAB — RESP PANEL BY RT-PCR (FLU A&B, COVID) ARPGX2
Influenza A by PCR: NEGATIVE
Influenza B by PCR: NEGATIVE
SARS Coronavirus 2 by RT PCR: NEGATIVE

## 2022-09-22 LAB — D-DIMER, QUANTITATIVE: D-Dimer, Quant: <0.27 ug/mL-FEU (ref 0.00–0.50)

## 2022-09-22 LAB — BRAIN NATRIURETIC PEPTIDE: B Natriuretic Peptide: 438 pg/mL — ABNORMAL HIGH (ref 0.0–100.0)

## 2022-09-22 MED ORDER — ACETAMINOPHEN 650 MG RE SUPP: 650.0000 mg | Freq: Four times a day (QID) | RECTAL | Status: DC | PRN

## 2022-09-22 MED ORDER — PREGABALIN 25 MG PO CAPS
50.0000 mg | ORAL_CAPSULE | Freq: Every day | ORAL | Status: DC
  Administered 2022-09-23 – 2022-09-29 (×7): 50 mg via ORAL
  Filled 2022-09-22 (×7): qty 2

## 2022-09-22 MED ORDER — BUPROPION HCL ER (XL) 150 MG PO TB24
300.0000 mg | ORAL_TABLET | Freq: Every day | ORAL | Status: DC
  Administered 2022-09-24 – 2022-09-29 (×6): 300 mg via ORAL
  Filled 2022-09-22 (×5): qty 2
  Filled 2022-09-22: qty 1
  Filled 2022-09-22: qty 2
  Filled 2022-09-22: qty 1

## 2022-09-22 MED ORDER — METOPROLOL TARTRATE 100 MG PO TABS
100.0000 mg | ORAL_TABLET | Freq: Two times a day (BID) | ORAL | Status: DC
  Administered 2022-09-22 – 2022-09-27 (×11): 100 mg via ORAL
  Filled 2022-09-22 (×5): qty 1
  Filled 2022-09-22: qty 4
  Filled 2022-09-22 (×2): qty 1
  Filled 2022-09-22: qty 4
  Filled 2022-09-22 (×2): qty 1

## 2022-09-22 MED ORDER — MAGNESIUM SULFATE IN D5W 1-5 GM/100ML-% IV SOLN
1.0000 g | Freq: Once | INTRAVENOUS | Status: AC
  Administered 2022-09-22: 1 g via INTRAVENOUS
  Filled 2022-09-22: qty 100

## 2022-09-22 MED ORDER — FUROSEMIDE 10 MG/ML IJ SOLN
40.0000 mg | Freq: Once | INTRAMUSCULAR | Status: AC
  Administered 2022-09-22: 40 mg via INTRAVENOUS
  Filled 2022-09-22: qty 4

## 2022-09-22 MED ORDER — METOPROLOL TARTRATE 5 MG/5ML IV SOLN
5.0000 mg | Freq: Once | INTRAVENOUS | Status: AC
  Administered 2022-09-22: 5 mg via INTRAVENOUS
  Filled 2022-09-22: qty 5

## 2022-09-22 MED ORDER — PANTOPRAZOLE SODIUM 40 MG PO TBEC: 40.0000 mg | DELAYED_RELEASE_TABLET | Freq: Every day | ORAL | Status: DC

## 2022-09-22 MED ORDER — METOPROLOL TARTRATE 25 MG PO TABS: 75.0000 mg | ORAL_TABLET | Freq: Two times a day (BID) | ORAL | Status: DC

## 2022-09-22 MED ORDER — CLONAZEPAM 0.5 MG PO TABS
0.5000 mg | ORAL_TABLET | Freq: Two times a day (BID) | ORAL | Status: DC
  Administered 2022-09-22 – 2022-09-29 (×14): 0.5 mg via ORAL
  Filled 2022-09-22 (×14): qty 1

## 2022-09-22 MED ORDER — DULOXETINE HCL 60 MG PO CPEP
60.0000 mg | ORAL_CAPSULE | Freq: Every day | ORAL | Status: DC
  Administered 2022-09-23 – 2022-09-29 (×7): 60 mg via ORAL
  Filled 2022-09-22: qty 1
  Filled 2022-09-22: qty 2
  Filled 2022-09-22 (×5): qty 1

## 2022-09-22 MED ORDER — POTASSIUM CHLORIDE CRYS ER 20 MEQ PO TBCR
40.0000 meq | EXTENDED_RELEASE_TABLET | ORAL | Status: AC
  Administered 2022-09-22 (×2): 40 meq via ORAL
  Filled 2022-09-22 (×2): qty 2

## 2022-09-22 MED ORDER — ACETAMINOPHEN 325 MG PO TABS
650.0000 mg | ORAL_TABLET | Freq: Four times a day (QID) | ORAL | Status: DC | PRN
  Administered 2022-09-28: 650 mg via ORAL
  Filled 2022-09-22 (×2): qty 2

## 2022-09-22 MED ORDER — APIXABAN 5 MG PO TABS
5.0000 mg | ORAL_TABLET | Freq: Two times a day (BID) | ORAL | Status: DC
  Administered 2022-09-22 – 2022-09-25 (×6): 5 mg via ORAL
  Filled 2022-09-22 (×6): qty 1

## 2022-09-22 MED ORDER — SENNOSIDES-DOCUSATE SODIUM 8.6-50 MG PO TABS: 1.0000 | ORAL_TABLET | Freq: Every evening | ORAL | Status: DC | PRN

## 2022-09-22 MED ORDER — POTASSIUM CHLORIDE CRYS ER 20 MEQ PO TBCR: 40.0000 meq | EXTENDED_RELEASE_TABLET | Freq: Once | ORAL | Status: DC

## 2022-09-22 MED ORDER — ASPIRIN 81 MG PO CHEW
324.0000 mg | CHEWABLE_TABLET | Freq: Once | ORAL | Status: AC
  Administered 2022-09-22: 324 mg via ORAL
  Filled 2022-09-22: qty 4

## 2022-09-22 NOTE — H&P (Signed)
PCP:   Shirline Frees, NP   Chief Complaint: Chest burning and palpitations   HPI: This 86 year old female with past medical history of hypertension, rheumatoid arthritis, chronic CHF, COPD, chronic respiratory failure on 2.5 L at night, esophageal strictures.  Her complaint is that when she walks she develops chest tightness and a burning sensation that goes across her chest and down both arms to her elbows.  This started 10 days ago.  Subsequently it has been getting worse and worse.  She does not complain  of shortness of breath more chest tightness discomfort, palpitation.  She denies any worsening lower extremity edema.  She denies increased oxygen need.  Today after walking few steps she has a severe chest burning and palpitation she could not stand straight.  She decided to come to the ER.  In the ER cardiac enzymes x3 negative.  She is tachycardic with lots of PVCs with aberrancy.  No atrial fibrillation noted.  Attempt was made to walk her and she became extremely tachycardic and dyspneic per ER physician.  Hospitalist service asked to admit.  Dr Pauletta Browns cardiology  Review of Systems:  The patient denies anorexia, fever, weight loss,, vision loss, decreased hearing, hoarseness, chest pain, syncope, dyspnea on exertion, peripheral edema, balance deficits, hemoptysis, abdominal pain, melena, hematochezia, severe indigestion/heartburn, hematuria, incontinence, genital sores, muscle weakness, suspicious skin lesions, transient blindness, difficulty walking, depression, unusual weight change, abnormal bleeding, enlarged lymph nodes, angioedema, and breast masses. Positives: Palpitations, chest pains, chest burning  Past Medical History: Past Medical History:  Diagnosis Date   A-fib (HCC)    Anemia    Anxiety    Bipolar disorder (HCC)    Blood transfusion 1991   autologous pts own blood given    CAD (coronary artery disease)    Cataract    Chest pain    "@ rest, lying down, w/exertion"    CHF (congestive heart failure) (HCC) 07/2020   Chronic back pain    "mostly lower back but I do have upper back pain regularly" (04/06/2018)   COPD (chronic obstructive pulmonary disease) (HCC)    Depression    Esophageal stricture    Fibromyalgia    GERD (gastroesophageal reflux disease)    Heart murmur    "slight" (04/06/2018)   Hiatal hernia    Hyperlipidemia    Patient denies   Hypertension    Internal hemorrhoids    Lumbago    Mitral regurgitation    Osteoarthritis    Osteoporosis    Pneumonia ~ 01/2018   PONV (postoperative nausea and vomiting)    severe ponv, "in the past" (04/06/2018)   Rectal bleeding    Rheumatoid arthritis (HCC)    Spondylosis    TIA (transient ischemic attack) 2013   Urinary incontinence    wears depends   Past Surgical History:  Procedure Laterality Date   ABDOMINAL HYSTERECTOMY  1982   BACK SURGERY     BALLOON DILATION N/A 09/21/2018   Procedure: BALLOON DILATION;  Surgeon: Hilarie Fredrickson, MD;  Location: [facility] ENDOSCOPY;  Service: Endoscopy;  Laterality: N/A;   BALLOON DILATION N/A 08/12/2019   Procedure: BALLOON DILATION;  Surgeon: Lynann Bologna, MD;  Location: [facility] ENDOSCOPY;  Service: Endoscopy;  Laterality: N/A;   BIOPSY  08/12/2019   Procedure: BIOPSY;  Surgeon: Lynann Bologna, MD;  Location: [facility] ENDOSCOPY;  Service: Endoscopy;;   BLADDER SUSPENSION  1980's   BOTOX INJECTION N/A 09/21/2018   Procedure: BOTOX INJECTION;  Surgeon: Hilarie Fredrickson, MD;  Location: [facility]  ENDOSCOPY;  Service: Endoscopy;  Laterality: N/A;   BOTOX INJECTION N/A 08/12/2019   Procedure: BOTOX INJECTION;  Surgeon: Lynann Bologna, MD;  Location: [facility] ENDOSCOPY;  Service: Endoscopy;  Laterality: N/A;   CATARACT EXTRACTION W/ INTRAOCULAR LENS  IMPLANT, BILATERAL Bilateral 2010   DILATION AND CURETTAGE OF UTERUS  1961   ESOPHAGEAL MANOMETRY N/A 03/25/2018   Procedure: ESOPHAGEAL MANOMETRY (EM);  Surgeon: Napoleon Form, MD;  Location: [facility] ENDOSCOPY;  Service: Endoscopy;  Laterality:  N/A;   ESOPHAGOGASTRODUODENOSCOPY (EGD) WITH PROPOFOL N/A 04/07/2018   Procedure: ESOPHAGOGASTRODUODENOSCOPY (EGD) WITH PROPOFOL;  Surgeon: Sherrilyn Rist, MD;  Location: [facility] ENDOSCOPY;  Service: Gastroenterology;  Laterality: N/A;   ESOPHAGOGASTRODUODENOSCOPY (EGD) WITH PROPOFOL N/A 09/21/2018   Procedure: ESOPHAGOGASTRODUODENOSCOPY (EGD) WITH PROPOFOL;  Surgeon: Hilarie Fredrickson, MD;  Location: [facility] ENDOSCOPY;  Service: Endoscopy;  Laterality: N/A;   ESOPHAGOGASTRODUODENOSCOPY (EGD) WITH PROPOFOL N/A 08/12/2019   Procedure: ESOPHAGOGASTRODUODENOSCOPY (EGD) WITH PROPOFOL;  Surgeon: Lynann Bologna, MD;  Location: [facility] ENDOSCOPY;  Service: Endoscopy;  Laterality: N/A;   HERNIA REPAIR     HIATAL HERNIA REPAIR N/A 08/02/2016   Procedure: LAPAROSCOPIC REPAIR OF LARGE  HIATAL HERNIA;  Surgeon: Glenna Fellows, MD;  Location: [facility] ORS;  Service: General;  Laterality: N/A;   JOINT REPLACEMENT     LAPAROSCOPIC NISSEN FUNDOPLICATION N/A 08/02/2016   Procedure: LAPAROSCOPIC NISSEN FUNDOPLICATION;  Surgeon: Glenna Fellows, MD;  Location: [facility] ORS;  Service: General;  Laterality: N/A;   LUMBAR LAMINECTOMY  1990; 1994; [id number]   "I've got 2 stainless steel rods; 6 screws; 2 ray cages"took bone from right hip to put in back   RIGHT/LEFT HEART CATH AND CORONARY ANGIOGRAPHY N/A 07/26/2019   Procedure: RIGHT/LEFT HEART CATH AND CORONARY ANGIOGRAPHY;  Surgeon: Lyn Records, MD;  Location: [facility] INVASIVE CV LAB;  Service: Cardiovascular;  Laterality: N/A;   SVT ABLATION N/A 04/25/2021   Procedure: SVT ABLATION;  Surgeon: Regan Lemming, MD;  Location: [facility] INVASIVE CV LAB;  Service: Cardiovascular;  Laterality: N/A;   TEE WITHOUT CARDIOVERSION N/A 08/19/2019   Procedure: TRANSESOPHAGEAL ECHOCARDIOGRAM (TEE);  Surgeon: Lewayne Bunting, MD;  Location: [facility] ENDOSCOPY;  Service: Cardiovascular;  Laterality: N/A;   TONSILLECTOMY AND ADENOIDECTOMY  1945   TOTAL KNEE ARTHROPLASTY Right ~ 1996   TUBAL LIGATION  ~ 1976     Medications: Prior to Admission medications   Medication Sig Start Date End Date Taking? Authorizing Provider  clonazePAM (KLONOPIN) 0.5 MG tablet Take 0.5 tablets (0.25 mg total) by mouth 2 (two) times daily as needed for anxiety. 09/11/22 10/11/22  Nafziger, [name], NP  acetaminophen (TYLENOL) 500 MG tablet Take 1,000 mg by mouth every 6 (six) hours as needed for moderate pain, headache or fever.    [provider]  albuterol (VENTOLIN HFA) 108 (90 Base) MCG/ACT inhaler TAKE 2 PUFFS BY MOUTH EVERY 6 HOURS AS NEEDED FOR WHEEZE OR SHORTNESS OF BREATH 08/06/22   Nafziger, [name], NP  buPROPion (WELLBUTRIN XL) 300 MG 24 hr tablet TAKE 1 TABLET BY MOUTH EVERY DAY 08/06/22   Nafziger, [name], NP  Carboxymethylcellulose Sodium (REFRESH TEARS OP) Place 1 drop into both eyes 2 (two) times daily.    [provider]  cetirizine (ZYRTEC) 10 MG tablet Take 10 mg by mouth daily as needed for allergies. 02/20/21   [provider]  DULoxetine (CYMBALTA) 60 MG capsule TAKE 1 CAPSULE BY MOUTH EVERY DAY 11/30/21   Lovorn, Aundra Millet, MD  ELIQUIS 5 MG TABS tablet TAKE 1 TABLET BY MOUTH TWICE A  DAY 05/08/22   Camnitz, Andree Coss, MD  FeFum-FePoly-FA-B Cmp-C-Biot (INTEGRA PLUS) CAPS Take 1 capsule by mouth every morning. 08/30/22   Heilingoetter, Cassandra L, PA-C  KLOR-CON M20 20 MEQ tablet TAKE 1 TABLET BY MOUTH EVERY DAY 08/06/22   Tereso Newcomer T, PA-C  losartan (COZAAR) 50 MG tablet TAKE 1 TABLET BY MOUTH EVERY DAY 07/18/22   Camnitz, Andree Coss, MD  metoprolol tartrate (LOPRESSOR) 50 MG tablet Take 1.5 tablets (75 mg total) by mouth 2 (two) times daily. 07/18/22   Camnitz, Andree Coss, MD  Multiple Vitamins-Minerals (ONE-A-DAY WOMENS 50+) TABS Take 1 tablet by mouth daily.    [provider]  omeprazole (PRILOSEC) 20 MG capsule TAKE 1 CAPSULE BY MOUTH EVERY DAY 08/28/22   Nafziger, [name], NP  pregabalin (LYRICA) 50 MG capsule TAKE 1 CAPSULE BY MOUTH EVERY DAY 09/05/22   Nafziger, [name], NP   sodium chloride (OCEAN) 0.65 % SOLN nasal spray Place 1 spray into both nostrils as needed for congestion.    [provider]  spironolactone (ALDACTONE) 25 MG tablet Take 25 mg by mouth daily. 05/26/22   [provider]    Allergies:   Allergies  Allergen Reactions   Tape Other (See Comments)    Band aides, adhesive tape Redness and pulls skin off   Tramadol Other (See Comments)    Pt has prolonged Qtc interval of 540- cannot give tramadol per pharmacy   Morphine Itching and Rash    Flushing    Social History:  reports that she has never smoked. She has never used smokeless tobacco. She reports that she does not drink alcohol and does not use drugs.  Family History: Family History  Problem Relation Age of Onset   Kidney disease Mother    Hypertension Mother    Heart disease Father 53       die of MI at age 41   Heart disease Brother    Heart attack Brother    Suicidality Son    Other Son        MVA   Colon cancer Neg Hx    Esophageal cancer Neg Hx    Pancreatic cancer Neg Hx    Rectal cancer Neg Hx    Stomach cancer Neg Hx     Physical Exam: Vitals:   09/22/22 1915 09/22/22 2030 09/22/22 2100 09/22/22 2115  BP: (!) 143/109 (!) 155/100 (!) 147/89 (!) 154/78  Pulse: (!) 119 97 (!) 106 79  Resp: (!) 24 (!) 25 (!) 22 18  Temp:      TempSrc:      SpO2: 100% 100% 99% 100%  Weight:      Height:        General:  Alert and oriented times three, well developed and nourished, no acute distress Eyes: PERRLA, pink conjunctiva, no scleral icterus ENT: Moist oral mucosa, neck supple, no thyromegaly Lungs: clear to ascultation, no wheeze, no crackles, no use of accessory muscles Cardiovascular: tachy , irregular rate and rhythm, no regurgitation, no gallops, no murmurs. No carotid bruits, no JVD Abdomen: soft, positive BS, non-tender, non-distended, no organomegaly, not an acute abdomen GU: not examined Neuro: CN II - XII grossly intact, sensation  intact Musculoskeletal: strength 5/5 all extremities, no clubbing, cyanosis or edema Skin: no rash, no subcutaneous crepitation, no decubitus Psych: appropriate patient   Labs on Admission:  Recent Labs    09/22/22 1705 09/22/22 1719  NA 138 137  K 3.5 3.5  CL 101  --  CO2 26  --   GLUCOSE 102*  --   BUN 12  --   CREATININE 0.89  --   CALCIUM 8.9  --    Recent Labs    09/22/22 1705  AST 17  ALT 15  ALKPHOS 64  BILITOT 0.6  PROT 5.8*  ALBUMIN 3.1*    Recent Labs    09/22/22 1705 09/22/22 1719  WBC 12.5*  --   NEUTROABS 10.2*  --   HGB 9.8* 10.2*  HCT 30.5* 30.0*  MCV 93.0  --   PLT 339  --    Recent Labs    09/22/22 1705  DDIMER <0.27    Micro Results: Recent Results (from the past 240 hour(s))  Resp Panel by RT-PCR (Flu A&B, Covid) Anterior Nasal Swab     Status: None   Collection Time: 09/22/22  5:28 PM   Specimen: Anterior Nasal Swab  Result Value Ref Range Status   SARS Coronavirus 2 by RT PCR NEGATIVE NEGATIVE Final    Comment: (NOTE) SARS-CoV-2 target nucleic acids are NOT DETECTED.  The SARS-CoV-2 RNA is generally detectable in upper respiratory specimens during the acute phase of infection. The lowest concentration of SARS-CoV-2 viral copies this assay can detect is 138 copies/mL. A negative result does not preclude SARS-Cov-2 infection and should not be used as the sole basis for treatment or other patient management decisions. A negative result may occur with  improper specimen collection/handling, submission of specimen other than nasopharyngeal swab, presence of viral mutation(s) within the areas targeted by this assay, and inadequate number of viral copies(<138 copies/mL). A negative result must be combined with clinical observations, patient history, and epidemiological information. The expected result is Negative.  Fact Sheet for Patients:  BloggerCourse.com  Fact Sheet for Healthcare Providers:   SeriousBroker.it  This test is no t yet approved or cleared by the [place] FDA and  has been authorized for detection and/or diagnosis of SARS-CoV-2 by FDA under an Emergency Use Authorization (EUA). This EUA will remain  in effect (meaning this test can be used) for the duration of the COVID-19 declaration under Section 564(b)(1) of the Act, 21 U.S.C.section 360bbb-3(b)(1), unless the authorization is terminated  or revoked sooner.       Influenza A by PCR NEGATIVE NEGATIVE Final   Influenza B by PCR NEGATIVE NEGATIVE Final    Comment: (NOTE) The Xpert Xpress SARS-CoV-2/FLU/RSV plus assay is intended as an aid in the diagnosis of influenza from Nasopharyngeal swab specimens and should not be used as a sole basis for treatment. Nasal washings and aspirates are unacceptable for Xpert Xpress SARS-CoV-2/FLU/RSV testing.  Fact Sheet for Patients: BloggerCourse.com  Fact Sheet for Healthcare Providers: SeriousBroker.it  This test is not yet approved or cleared by the [place] FDA and has been authorized for detection and/or diagnosis of SARS-CoV-2 by FDA under an Emergency Use Authorization (EUA). This EUA will remain in effect (meaning this test can be used) for the duration of the COVID-19 declaration under Section 564(b)(1) of the Act, 21 U.S.C. section 360bbb-3(b)(1), unless the authorization is terminated or revoked.  Performed at [facility], [address], [place], [place] [address]      Radiological Exams on Admission: DG Chest Port 1 View  Result Date: 09/22/2022 CLINICAL DATA:  Chest pain, difficulty breathing. EXAM: PORTABLE CHEST 1 VIEW COMPARISON:  None Available. FINDINGS: Heart is enlarged, unchanged. Both lungs are clear. The visualized skeletal structures are unremarkable. IMPRESSION: No active disease. Cardiomegaly. Electronically  Signed   By: Darliss Cheney M.D.    On: 09/22/2022 17:44    Assessment/Plan Present on Admission: Symptomatic arrhythmias -Admit to med telemetry -Here in the ER, multiple PVCs and irregular rhythms noted on telemetry.  No atrial fibrillation no sustained SVTs. -Metoprolol last taken 09/21/22 PM dosage.  Cardiac history: 2021, CV 04/03/21 SVT, s/p CV 3 times in ER. Amio and metoprolol 04/25/21 EPS  study. PAF 04/25/21 RFA 02/28/22 EKG suspect junctional rhythm 03/05/22 Holter monitor -Predominant rhythm was NSR with average HR 65bpm  14 runs of SVT with longest lasting 2 hours and at HR 128bpm  Occasional SVE (1.1%), rare VE (<1%)  Patient triggered events correlate with SVT and PVCs  No Afib or significant pauses seen 09/22/22 here in ER -Patient's current symptoms is similar to what she had in the past when she had the paroxysmal atrial fibrillation. -Plans: Metoprolol increased to 100 mg p.o. twice daily, 1st dose now.  Losartan on hold as metoprolol being titrated upwards.   -potassium 3.5. potassium ordered. Goal potassium 4.0 -Mag level ordered. Goal mag 2.0. - 1gm IV mag ordered -Per records in past patient has been unresponsive to the Cardizem -H/o paroxysmal atrial fibrillation.  Eliquis twice daily continued -Cardiology consult in AM   (HFpEF) heart failure with preserved ejection fraction (HCC) -No evidence of CHF -Resume spironolactone, aspirin, metoprolol.  Losartan on hold   Essential hypertension -Metoprolol resumed at increased dose.  Losartan held   Major depressive disorder, recurrent (HCC)  Generalized anxiety disorder -Resume Wellbutrin, Cymbalta -Klonopin 0.5 mg p.o. twice daily resume   Chronic respiratory failure with hypoxia (HCC) -Continue oxygen   COPD (chronic obstructive pulmonary disease) (HCC) -Stable, no exacerbation. -Zyrtec, albuterol as needed   Chronic pain syndrome -Cymbalta resumed   Rheumatoid arthritis involving multiple sites (HCC)  Idiopathic peripheral  neuropathy  Sharone Picchi 09/22/2022, 9:33 PM

## 2022-09-22 NOTE — ED Notes (Signed)
Attempted to walk patient but she started shake all over and complain of being dizzy. I also noticed patients feet are red and feel hot to the touch.

## 2022-09-22 NOTE — ED Triage Notes (Signed)
Patient arrived via [facility] with complaints of center chest pain that radiates to both arms and associated with nausea and no vomiting, onset about 10 days. Patient came from home.

## 2022-09-22 NOTE — ED Provider Notes (Addendum)
[facility] EMERGENCY DEPARTMENT Provider Note   CSN: [id number] Arrival date & time: 09/22/22  1657     History  Chief Complaint  Patient presents with   Chest Pain    Julie Rice is a 86 y.o. female.  Patient as above with significant medical history as below, including afib, anemia, CAD, CHF, COPD, GERD, HLD, HTN, mitral regurg, TIA who presents to the ED with complaint of chest pain, dib. Symptoms ongoign around 2 weeks, gradually worsening. DIB primarily with exertion, typically can ambulate without much difficulty however in last 2 weeks, moreso last 2-3 days having sig dib with ambulation, even minimal ambulation of a few steps. A/w chest tightness, bandlike sensation across chest, discomfort primarily on the left but does radiate to the right. Feels like her arms are heavy when this sensation is ongoing. Also nausea with symptoms. At rest she feels okay, no current DIB or cp on assessment. She wears O2 at night-time but not typically in the day. Feels her legs are more swollen last few days, compliant with home medications. No fevers but is having chills that wake her up in the morning last few days, wakes up sweating. No recent travel/sick contacts.       Past Medical History:  Diagnosis Date   A-fib (HCC)    Anemia    Anxiety    Bipolar disorder (HCC)    Blood transfusion 1991   autologous pts own blood given    CAD (coronary artery disease)    Cataract    Chest pain    "@ rest, lying down, w/exertion"   CHF (congestive heart failure) (HCC) 07/2020   Chronic back pain    "mostly lower back but I do have upper back pain regularly" (04/06/2018)   COPD (chronic obstructive pulmonary disease) (HCC)    Depression    Esophageal stricture    Fibromyalgia    GERD (gastroesophageal reflux disease)    Heart murmur    "slight" (04/06/2018)   Hiatal hernia    Hyperlipidemia    Patient denies   Hypertension    Internal hemorrhoids    Lumbago    Mitral  regurgitation    Osteoarthritis    Osteoporosis    Pneumonia ~ 01/2018   PONV (postoperative nausea and vomiting)    severe ponv, "in the past" (04/06/2018)   Rectal bleeding    Rheumatoid arthritis (HCC)    Spondylosis    TIA (transient ischemic attack) 2013   Urinary incontinence    wears depends    Past Surgical History:  Procedure Laterality Date   ABDOMINAL HYSTERECTOMY  1982   BACK SURGERY     BALLOON DILATION N/A 09/21/2018   Procedure: BALLOON DILATION;  Surgeon: Hilarie Fredrickson, MD;  Location: [facility] ENDOSCOPY;  Service: Endoscopy;  Laterality: N/A;   BALLOON DILATION N/A 08/12/2019   Procedure: BALLOON DILATION;  Surgeon: Lynann Bologna, MD;  Location: [facility] ENDOSCOPY;  Service: Endoscopy;  Laterality: N/A;   BIOPSY  08/12/2019   Procedure: BIOPSY;  Surgeon: Lynann Bologna, MD;  Location: [facility] ENDOSCOPY;  Service: Endoscopy;;   BLADDER SUSPENSION  1980's   BOTOX INJECTION N/A 09/21/2018   Procedure: BOTOX INJECTION;  Surgeon: Hilarie Fredrickson, MD;  Location: [facility] ENDOSCOPY;  Service: Endoscopy;  Laterality: N/A;   BOTOX INJECTION N/A 08/12/2019   Procedure: BOTOX INJECTION;  Surgeon: Lynann Bologna, MD;  Location: [facility] ENDOSCOPY;  Service: Endoscopy;  Laterality: N/A;   CATARACT EXTRACTION W/ INTRAOCULAR LENS  IMPLANT, BILATERAL Bilateral  2010   DILATION AND CURETTAGE OF UTERUS  1961   ESOPHAGEAL MANOMETRY N/A 03/25/2018   Procedure: ESOPHAGEAL MANOMETRY (EM);  Surgeon: Napoleon Form, MD;  Location: [facility] ENDOSCOPY;  Service: Endoscopy;  Laterality: N/A;   ESOPHAGOGASTRODUODENOSCOPY (EGD) WITH PROPOFOL N/A 04/07/2018   Procedure: ESOPHAGOGASTRODUODENOSCOPY (EGD) WITH PROPOFOL;  Surgeon: Sherrilyn Rist, MD;  Location: [facility] ENDOSCOPY;  Service: Gastroenterology;  Laterality: N/A;   ESOPHAGOGASTRODUODENOSCOPY (EGD) WITH PROPOFOL N/A 09/21/2018   Procedure: ESOPHAGOGASTRODUODENOSCOPY (EGD) WITH PROPOFOL;  Surgeon: Hilarie Fredrickson, MD;  Location: [facility] ENDOSCOPY;  Service: Endoscopy;  Laterality: N/A;    ESOPHAGOGASTRODUODENOSCOPY (EGD) WITH PROPOFOL N/A 08/12/2019   Procedure: ESOPHAGOGASTRODUODENOSCOPY (EGD) WITH PROPOFOL;  Surgeon: Lynann Bologna, MD;  Location: [facility] ENDOSCOPY;  Service: Endoscopy;  Laterality: N/A;   HERNIA REPAIR     HIATAL HERNIA REPAIR N/A 08/02/2016   Procedure: LAPAROSCOPIC REPAIR OF LARGE  HIATAL HERNIA;  Surgeon: Glenna Fellows, MD;  Location: [facility] ORS;  Service: General;  Laterality: N/A;   JOINT REPLACEMENT     LAPAROSCOPIC NISSEN FUNDOPLICATION N/A 08/02/2016   Procedure: LAPAROSCOPIC NISSEN FUNDOPLICATION;  Surgeon: Glenna Fellows, MD;  Location: [facility] ORS;  Service: General;  Laterality: N/A;   LUMBAR LAMINECTOMY  1990; 1994; [id number]   "I've got 2 stainless steel rods; 6 screws; 2 ray cages"took bone from right hip to put in back   RIGHT/LEFT HEART CATH AND CORONARY ANGIOGRAPHY N/A 07/26/2019   Procedure: RIGHT/LEFT HEART CATH AND CORONARY ANGIOGRAPHY;  Surgeon: Lyn Records, MD;  Location: [facility] INVASIVE CV LAB;  Service: Cardiovascular;  Laterality: N/A;   SVT ABLATION N/A 04/25/2021   Procedure: SVT ABLATION;  Surgeon: Regan Lemming, MD;  Location: [facility] INVASIVE CV LAB;  Service: Cardiovascular;  Laterality: N/A;   TEE WITHOUT CARDIOVERSION N/A 08/19/2019   Procedure: TRANSESOPHAGEAL ECHOCARDIOGRAM (TEE);  Surgeon: Lewayne Bunting, MD;  Location[facility] ENDOSCOPY;  Service: Cardiovascular;  Laterality: N/A;   TONSILLECTOMY AND ADENOIDECTOMY  1945   TOTAL KNEE ARTHROPLASTY Right ~ 1996   TUBAL LIGATION  ~ 1976     The history is provided by the patient and the EMS personnel. No language interpreter was used.  Chest Pain Associated symptoms: fatigue, nausea and shortness of breath   Associated symptoms: no abdominal pain, no back pain, no cough, no dysphagia, no fever, no headache, no palpitations and no vomiting        Home Medications Prior to Admission medications   Medication Sig Start Date End Date Taking? Authorizing Provider  clonazePAM (KLONOPIN)  0.5 MG tablet Take 0.5 tablets (0.25 mg total) by mouth 2 (two) times daily as needed for anxiety. 09/11/22 10/11/22  Nafziger, [name], NP  acetaminophen (TYLENOL) 500 MG tablet Take 1,000 mg by mouth every 6 (six) hours as needed for moderate pain, headache or fever.    [provider]  albuterol (VENTOLIN HFA) 108 (90 Base) MCG/ACT inhaler TAKE 2 PUFFS BY MOUTH EVERY 6 HOURS AS NEEDED FOR WHEEZE OR SHORTNESS OF BREATH 08/06/22   Nafziger, [name], NP  buPROPion (WELLBUTRIN XL) 300 MG 24 hr tablet TAKE 1 TABLET BY MOUTH EVERY DAY 08/06/22   Nafziger, [name], NP  Carboxymethylcellulose Sodium (REFRESH TEARS OP) Place 1 drop into both eyes 2 (two) times daily.    [provider]  cetirizine (ZYRTEC) 10 MG tablet Take 10 mg by mouth daily as needed for allergies. 02/20/21   [provider]  DULoxetine (CYMBALTA) 60 MG capsule TAKE 1 CAPSULE BY MOUTH EVERY DAY 11/30/21   Lovorn,  Aundra Millet, MD  ELIQUIS 5 MG TABS tablet TAKE 1 TABLET BY MOUTH TWICE A DAY 05/08/22   Camnitz, Andree Coss, MD  FeFum-FePoly-FA-B Cmp-C-Biot (INTEGRA PLUS) CAPS Take 1 capsule by mouth every morning. 08/30/22   Heilingoetter, Cassandra L, PA-C  KLOR-CON M20 20 MEQ tablet TAKE 1 TABLET BY MOUTH EVERY DAY 08/06/22   Tereso Newcomer T, PA-C  losartan (COZAAR) 50 MG tablet TAKE 1 TABLET BY MOUTH EVERY DAY 07/18/22   Camnitz, Andree Coss, MD  metoprolol tartrate (LOPRESSOR) 50 MG tablet Take 1.5 tablets (75 mg total) by mouth 2 (two) times daily. 07/18/22   Camnitz, Andree Coss, MD  Multiple Vitamins-Minerals (ONE-A-DAY WOMENS 50+) TABS Take 1 tablet by mouth daily.    [provider]  omeprazole (PRILOSEC) 20 MG capsule TAKE 1 CAPSULE BY MOUTH EVERY DAY 08/28/22   Nafziger, [name], NP  pregabalin (LYRICA) 50 MG capsule TAKE 1 CAPSULE BY MOUTH EVERY DAY 09/05/22   Nafziger, [name], NP  sodium chloride (OCEAN) 0.65 % SOLN nasal spray Place 1 spray into both nostrils as needed for congestion.    [provider]   spironolactone (ALDACTONE) 25 MG tablet Take 25 mg by mouth daily. 05/26/22   [provider]      Allergies    Tape, Tramadol, and Morphine    Review of Systems   Review of Systems  Constitutional:  Positive for fatigue. Negative for activity change and fever.  HENT:  Negative for facial swelling and trouble swallowing.   Eyes:  Negative for discharge and redness.  Respiratory:  Positive for chest tightness and shortness of breath. Negative for cough.   Cardiovascular:  Positive for chest pain and leg swelling. Negative for palpitations.  Gastrointestinal:  Positive for nausea. Negative for abdominal pain and vomiting.  Genitourinary:  Negative for dysuria and flank pain.  Musculoskeletal:  Negative for back pain and gait problem.  Skin:  Negative for pallor and rash.  Neurological:  Negative for syncope and headaches.    Physical Exam Updated Vital Signs BP (!) 155/100   Pulse 97   Temp 98.3 F (36.8 C) (Oral)   Resp (!) 25   Ht  (1.575 m)   Wt 68 kg   SpO2 100%   BMI 27.44 kg/m  Physical Exam Vitals and nursing note reviewed.  Constitutional:      General: She is not in acute distress.    Appearance: Normal appearance. She is well-developed. She is not ill-appearing or diaphoretic.  HENT:     Head: Normocephalic and atraumatic.     Right Ear: External ear normal.     Left Ear: External ear normal.     Nose: Nose normal.     Mouth/Throat:     Mouth: Mucous membranes are moist.  Eyes:     General: No scleral icterus.       Right eye: No discharge.        Left eye: No discharge.  Cardiovascular:     Rate and Rhythm: Normal rate. Rhythm irregular.     Pulses: Normal pulses.     Heart sounds: Normal heart sounds.     No S3 or S4 sounds.  Pulmonary:     Effort: Pulmonary effort is normal. No respiratory distress.     Breath sounds: Normal breath sounds. No decreased breath sounds.  Abdominal:     General: Abdomen is flat.     Palpations: Abdomen  is soft.     Tenderness: There is no abdominal tenderness.  There is no guarding or rebound.  Musculoskeletal:        General: Normal range of motion.     Cervical back: Normal range of motion.     Right lower leg: Edema present.     Left lower leg: Edema present.  Feet:     Comments: Feet are red b/l but does not appear c/w cellulitis, no drainage, no erythema. Not ttp.  Skin:    General: Skin is warm and dry.     Capillary Refill: Capillary refill takes less than 2 seconds.  Neurological:     Mental Status: She is alert and oriented to person, place, and time.     GCS: GCS eye subscore is 4. GCS verbal subscore is 5. GCS motor subscore is 6.  Psychiatric:        Mood and Affect: Mood normal.        Behavior: Behavior normal.     ED Results / Procedures / Treatments   Labs (all labs ordered are listed, but only abnormal results are displayed) Labs Reviewed  COMPREHENSIVE METABOLIC PANEL - Abnormal; Notable for the following components:      Result Value   Glucose, Bld 102 (*)    Total Protein 5.8 (*)    Albumin 3.1 (*)    All other components within normal limits  BRAIN NATRIURETIC PEPTIDE - Abnormal; Notable for the following components:   B Natriuretic Peptide 438.0 (*)    All other components within normal limits  CBC WITH DIFFERENTIAL/PLATELET - Abnormal; Notable for the following components:   WBC 12.5 (*)    RBC 3.28 (*)    Hemoglobin 9.8 (*)    HCT 30.5 (*)    Neutro Abs 10.2 (*)    All other components within normal limits  I-STAT VENOUS BLOOD GAS, ED - Abnormal; Notable for the following components:   pH, Ven 7.460 (*)    pCO2, Ven 37.6 (*)    pO2, Ven 30 (*)    Acid-Base Excess 3.0 (*)    Calcium, Ion 1.11 (*)    HCT 30.0 (*)    Hemoglobin 10.2 (*)    All other components within normal limits  RESP PANEL BY RT-PCR (FLU A&B, COVID) ARPGX2  D-DIMER, QUANTITATIVE  BLOOD GAS, VENOUS  TROPONIN I (HIGH SENSITIVITY)  TROPONIN I (HIGH SENSITIVITY)     EKG EKG Interpretation  Date/Time:  Sunday September 22 2022 17:11:00 EST Ventricular Rate:  101 PR Interval:  143 QRS Duration: 112 QT Interval:  355 QTC Calculation: 461 R Axis:   -9 Text Interpretation: Sinus tachycardia Paired ventricular premature complexes Anterior infarct, old Confirmed by Tanda Rockers ([id number]) on 09/22/2022 5:56:57 PM  Radiology DG Chest Port 1 View  Result Date: 09/22/2022 CLINICAL DATA:  Chest pain, difficulty breathing. EXAM: PORTABLE CHEST 1 VIEW COMPARISON:  None Available. FINDINGS: Heart is enlarged, unchanged. Both lungs are clear. The visualized skeletal structures are unremarkable. IMPRESSION: No active disease. Cardiomegaly. Electronically Signed   By: Darliss Cheney M.D.   On: 09/22/2022 17:44    Procedures Procedures    Medications Ordered in ED Medications  aspirin chewable tablet 324 mg (324 mg Oral Given 09/22/22 1809)  metoprolol tartrate (LOPRESSOR) injection 5 mg (5 mg Intravenous Given 09/22/22 2058)  furosemide (LASIX) injection 40 mg (40 mg Intravenous Given 09/22/22 2102)    ED Course/ Medical Decision Making/ A&P Clinical Course as of 09/22/22 2122  Sun Sep 22, 2022  2048 Attempted to ambulate patient, she became tachypneic, dyspneic,  tachycardic with just sitting at the edge of the bed.  [SG]  2049 HR intermittely elevated, having PVC's. Did not take beta blocker today, will give lopressor [SG]  2054 Hemoglobin(!): 9.8 Similar to baseline  [SG]    Clinical Course User Index [SG] Sloan Leiter, DO                           Medical Decision Making Amount and/or Complexity of Data Reviewed Labs: ordered. Decision-making details documented in ED Course. Radiology: ordered.  Risk OTC drugs. Prescription drug management. Decision regarding hospitalization.   This patient presents to the ED with chief complaint(s) of cp, dib weakness nausea with pertinent past medical history of above which further complicates the  presenting complaint. The complaint involves an extensive differential diagnosis and also carries with it a high risk of complications and morbidity.    In my evaluation of this patient's dyspnea my DDx includes, but is not limited to, pneumonia, pulmonary embolism, pneumothorax, pulmonary edema, metabolic acidosis, asthma, COPD, cardiac cause, anemia, anxiety, etc.   Differential includes all life-threatening causes for chest pain. This includes but is not exclusive to acute coronary syndrome, aortic dissection, pulmonary embolism, cardiac tamponade, community-acquired pneumonia, pericarditis, musculoskeletal chest wall pain, etc.  . Serious etiologies were considered.   The initial plan is to screening labs/imaging/cardiac monitoring, no current chest pain   Additional history obtained: Additional history obtained from EMS  Records reviewed Primary Care Documents and home medications, prior labs/imaging  Follows with Dr Eden Emms cardio Dr Elberta Fortis  EP ECHO 5/23, EF 50-55%, G2DD SVT ablation 6/22 Dr Elberta Fortis  Independent labs interpretation:  The following labs were independently interpreted:  BNP elev 438, fortunately troponin is not elevated as well CMP stable CBC with findings similar to her baseline hgb, has has elev wbc mild last 2 weeks VBG stable Dimer was negative   Independent visualization of imaging: - I independently visualized the following imaging with scope of interpretation limited to determining acute life threatening conditions related to emergency care: CXR, which revealed no acute process  Cardiac monitoring was reviewed and interpreted by myself which shows sinus tachy initially, now NSR with occ PVC's  Treatment and Reassessment: Asa Lasix  Lopressor >> improved   Consultation: - Consulted or discussed management/test interpretation w/ external professional: na  Consideration for admission or further workup: Admission was considered   Pt with sig cardiac  history as above, here today with exertional chest pain/dib. Story seems quite consistent with ACS however EKG and trop was not elevated. She was found to have elev BNP, also slight tachycardia but did not take her medications today. Given lopressor and lasix 40. Pt unable to ambulate, or even really sit at the side of the bed w/o dib, chest tightness; at this point would recommend admission for further eval. Presumed CHF exacerbation. Ad[facility] to [facility]   Social Determinants of health: Social History   Tobacco Use   Smoking status: Never   Smokeless tobacco: Never  Vaping Use   Vaping Use: Never used  Substance Use Topics   Alcohol use: Never   Drug use: Never            Final Clinical Impression(s) / ED Diagnoses Final diagnoses:  Acute on chronic diastolic congestive heart failure (HCC)  Exertional dyspnea    Rx / DC Orders ED Discharge Orders     None         Sloan Leiter, DO  09/22/22 2122    Sloan Leiter, DO 09/22/22 2122

## 2022-09-23 ENCOUNTER — Observation Stay (HOSPITAL_COMMUNITY): Payer: Medicare Other

## 2022-09-23 DIAGNOSIS — R079 Chest pain, unspecified: Secondary | ICD-10-CM | POA: Diagnosis not present

## 2022-09-23 DIAGNOSIS — F3340 Major depressive disorder, recurrent, in remission, unspecified: Secondary | ICD-10-CM | POA: Diagnosis not present

## 2022-09-23 DIAGNOSIS — F411 Generalized anxiety disorder: Secondary | ICD-10-CM

## 2022-09-23 DIAGNOSIS — I471 Supraventricular tachycardia, unspecified: Secondary | ICD-10-CM | POA: Diagnosis not present

## 2022-09-23 DIAGNOSIS — R9431 Abnormal electrocardiogram [ECG] [EKG]: Secondary | ICD-10-CM

## 2022-09-23 DIAGNOSIS — J9611 Chronic respiratory failure with hypoxia: Secondary | ICD-10-CM | POA: Diagnosis not present

## 2022-09-23 DIAGNOSIS — I499 Cardiac arrhythmia, unspecified: Secondary | ICD-10-CM | POA: Diagnosis not present

## 2022-09-23 DIAGNOSIS — G894 Chronic pain syndrome: Secondary | ICD-10-CM

## 2022-09-23 DIAGNOSIS — G609 Hereditary and idiopathic neuropathy, unspecified: Secondary | ICD-10-CM

## 2022-09-23 DIAGNOSIS — M069 Rheumatoid arthritis, unspecified: Secondary | ICD-10-CM

## 2022-09-23 LAB — MAGNESIUM: Magnesium: 2.3 mg/dL (ref 1.7–2.4)

## 2022-09-23 LAB — CBC WITH DIFFERENTIAL/PLATELET
Abs Immature Granulocytes: 0.05 10*3/uL (ref 0.00–0.07)
Basophils Absolute: 0.1 10*3/uL (ref 0.0–0.1)
Basophils Relative: 1 %
Eosinophils Absolute: 0.2 10*3/uL (ref 0.0–0.5)
Eosinophils Relative: 2 %
HCT: 31.5 % — ABNORMAL LOW (ref 36.0–46.0)
Hemoglobin: 10.1 g/dL — ABNORMAL LOW (ref 12.0–15.0)
Immature Granulocytes: 1 %
Lymphocytes Relative: 10 %
Lymphs Abs: 1.1 10*3/uL (ref 0.7–4.0)
MCH: 29.8 pg (ref 26.0–34.0)
MCHC: 32.1 g/dL (ref 30.0–36.0)
MCV: 92.9 fL (ref 80.0–100.0)
Monocytes Absolute: 0.9 10*3/uL (ref 0.1–1.0)
Monocytes Relative: 8 %
Neutro Abs: 8.7 10*3/uL — ABNORMAL HIGH (ref 1.7–7.7)
Neutrophils Relative %: 78 %
Platelets: 352 10*3/uL (ref 150–400)
RBC: 3.39 MIL/uL — ABNORMAL LOW (ref 3.87–5.11)
RDW: 15.3 % (ref 11.5–15.5)
WBC: 11 10*3/uL — ABNORMAL HIGH (ref 4.0–10.5)
nRBC: 0 % (ref 0.0–0.2)

## 2022-09-23 LAB — PHOSPHORUS: Phosphorus: 2.9 mg/dL (ref 2.5–4.6)

## 2022-09-23 LAB — BASIC METABOLIC PANEL
Anion gap: 11 (ref 5–15)
BUN: 13 mg/dL (ref 8–23)
CO2: 26 mmol/L (ref 22–32)
Calcium: 8.7 mg/dL — ABNORMAL LOW (ref 8.9–10.3)
Chloride: 100 mmol/L (ref 98–111)
Creatinine, Ser: 0.94 mg/dL (ref 0.44–1.00)
GFR, Estimated: 60 mL/min — ABNORMAL LOW (ref >60)
Glucose, Bld: 95 mg/dL (ref 70–99)
Potassium: 4.3 mmol/L (ref 3.5–5.1)
Sodium: 137 mmol/L (ref 135–145)

## 2022-09-23 LAB — TROPONIN I (HIGH SENSITIVITY): Troponin I (High Sensitivity): 11 ng/L (ref <18)

## 2022-09-23 MED ORDER — DICYCLOMINE HCL 10 MG/5ML PO SOLN
10.0000 mg | Freq: Once | ORAL | Status: AC
  Administered 2022-09-23: 10 mg via ORAL
  Filled 2022-09-23: qty 5

## 2022-09-23 MED ORDER — ALBUTEROL SULFATE (2.5 MG/3ML) 0.083% IN NEBU: 2.5000 mg | INHALATION_SOLUTION | Freq: Four times a day (QID) | RESPIRATORY_TRACT | Status: DC | PRN

## 2022-09-23 MED ORDER — ALUM & MAG HYDROXIDE-SIMETH 200-200-20 MG/5ML PO SUSP
30.0000 mL | Freq: Once | ORAL | Status: AC
  Administered 2022-09-23: 30 mL via ORAL
  Filled 2022-09-23: qty 30

## 2022-09-23 MED ORDER — NITROGLYCERIN 0.4 MG SL SUBL
SUBLINGUAL_TABLET | SUBLINGUAL | Status: AC
  Filled 2022-09-23: qty 2

## 2022-09-23 MED ORDER — METOPROLOL TARTRATE 5 MG/5ML IV SOLN
INTRAVENOUS | Status: AC
  Filled 2022-09-23: qty 5

## 2022-09-23 MED ORDER — IOHEXOL 350 MG/ML SOLN
100.0000 mL | Freq: Once | INTRAVENOUS | Status: AC | PRN
  Administered 2022-09-23: 100 mL via INTRAVENOUS

## 2022-09-23 MED ORDER — HYOSCYAMINE SULFATE 0.125 MG SL SUBL
0.2500 mg | SUBLINGUAL_TABLET | Freq: Once | SUBLINGUAL | Status: AC
  Administered 2022-09-23: 0.25 mg via SUBLINGUAL
  Filled 2022-09-23: qty 2

## 2022-09-23 MED ORDER — PANTOPRAZOLE SODIUM 40 MG PO TBEC
40.0000 mg | DELAYED_RELEASE_TABLET | Freq: Every day | ORAL | Status: DC
  Administered 2022-09-23 – 2022-09-24 (×2): 40 mg via ORAL
  Filled 2022-09-23 (×2): qty 1

## 2022-09-23 MED ORDER — LORATADINE 10 MG PO TABS
10.0000 mg | ORAL_TABLET | Freq: Every day | ORAL | Status: DC
  Administered 2022-09-23 – 2022-09-29 (×7): 10 mg via ORAL
  Filled 2022-09-23 (×7): qty 1

## 2022-09-23 MED ORDER — BISMUTH SUBSALICYLATE 262 MG/15ML PO SUSP
30.0000 mL | ORAL | Status: DC | PRN
  Filled 2022-09-23: qty 236

## 2022-09-23 NOTE — Progress Notes (Signed)
PROGRESS NOTE    Julie Rice  ZOX:[id number] DOB: [birth year] DOA: 09/22/2022 PCP: Shirline Frees, NP   Brief Narrative:  The patient is an 86 year old Caucasian female with a past medical history significant for but not to hypertension, rheumatoid arthritis, chronic diastolic CHF, COPD, chronic respiratory failure on 2-1/2 L of oxygen nightly, esophageal strictures as well as GERD and depression and anxiety as well as other comorbidities who presented to the emergency room with chest burning and palpitations.  Her main complaint was that when she walks she develops chest tightness and a burning sensation that goes across her chest and down both her arms to her elbows and discharged about 10 days ago and has subsequently been getting worse.  She does not complain of shortness of breath more of chest tightness and discomfort as well as palpitations.  She denies any worsening lower extremity edema and denies any increase in oxygen needs.  Yesterday after walking a few steps she had severe chest burning and palpitations that she could not stand straight so she decided to come to the ER for further evaluation.  In the ED her cardiac enzymes were negative x3 and she is noted to be tachycardic with a lot of PVCs with a Baron C.  There is no A-fib noted.  The EDP attempted to make her walk and she became extremely tachycardic and dyspneic per the ER physician so the hospitalist was consulted for further evaluation and admission.  Given her persistent symptoms cardiology has been consulted and they are recommending obtaining a coronary CT scan today if possible for her chest pain and if this is negative they are recommending doing a plain treadmill ECG test not looking for ischemia but to see if there is indeed exercise related tachyarrhythmia.  Cardiology feels that ultimately that her chest discomfort is related to her worsening pulmonary arterial hypertension with no clear etiology and that she may require a  right heart cath.  Assessment and Plan: No notes have been filed under this hospital service. Service: Hospitalist  Chest discomfort with palpitations and SVT on recent monitoring PAF -She has been admitted to medical observation to the telemetry unit -Became symptomatic when the EDP attempted to make her walk -Given her symptoms cardiology has been consulted and recommending a coronary CT angiography to evaluate for progression of known left moderate coronary artery stenosis that if this is negative they are recommending doing a plain treadmill ECG test not looking for ischemia but to see if there is indeed exercise related tachyarrhythmia -Cardiology feels that this may be related to her worsening pulmonary arterial hypertension with activity but the cause of her pulm hypertension is not entirely clear -Troponin values have ranged from 9 -> 11 -> 11 -She has had a significant cardiac history and in the ED she had multiple PVCs and irregular rhythms noted on telemetry with no A-fib and no sustained SVTs -She has had cardioversion 3 times as well as EPS evaluation for PAF as well as an ablation -Patient states that her current symptoms are similar to what happened in the past when she was in paroxysmal atrial fibrillation -Her metoprolol was increased to 100 mg p.o. twice daily and her losartan has been held however cardiology is planning coronary CTA as above -Continue monitor and trend electrolytes -She had nonobstructive coronary artery disease by heart cath in 2020 -Given her history of PAF she will be continued on apixaban  Chronic diastolic CHF and heart failure with preserved with ejection fraction -Does  not appear to be overtly volume overloaded -BNP was elevated at 438.0 -Continue to monitor for signs and symptoms of volume overload -Repeat chest x-ray in the a.m. -Given a dose of IV Lasix x1 -Currently her losartan and her spironolactone has been held but her metoprolol has been  increased to 100 mg p.o. twice daily -Appreciate cardiology evaluation and recommendations  Paroxysmal Atrial Fibrillation -She has a very high CHA2DS2-VASc of 8 -Continue with anticoagulation and telemetry monitoring -Currently getting metoprolol tartrate 100 mg p.o. twice daily now  Acquired Thrombophilia -As above due to A Fib  -C/w Anticoagulation  Essential Hypertension -Holding spironolactone and losartan for now -Continue metoprolol 100 mL p.o. twice daily -Continue to monitor blood pressures per protocol -Last blood pressure reading was 133/60  Major Depressive Disorder Generalized Anxiety Disorder -Continue with bupropion 200 g p.o. daily as well as clonazepam 0.5 mg p.o. twice daily as well as duloxetine 60 mg p.o. daily  Chronic respiratory failure with hypoxia as well as a history of COPD -SpO2: 97 %; currently not wearing supplemental oxygen as she only wears it nightly -Continuous pulse oximetry maintain O2 saturations greater than 90% -Continue to monitor respiratory status carefully-resume albuterol inhaler if necessary as well as cetirizine for allergies  Chronic Pain Syndrome -Continue with pregabalin 50 mg p.o. daily and with acetaminophen 660 mg p.o./RC every 6 as needed for mild pain  Rheumatoid Arthritis involving multiple sites as well as Idiopathic Peripheral Neuropathy -Continue with pregabalin 50 mg p.o. daily  Normocytic Anemia -Patient's hemoglobin/hematocrit went from 9.8/30.5 -> 10.2/30.0 -> 10.1/31.5 -Check anemia panel in the a.m. -Continue to monitor for signs and symptoms of bleeding; no overt bleeding noted -Repeat CBC in a.m.  Leukocytosis -Likely reactive in the setting of above -Patient's WBC went from 12.5 is now 11.0 -Continue monitor and trend and repeat CMP in a.m.  GERD/GI Prophylaxis -Continue with pantoprazole 40 mg p.o. daily  DVT prophylaxis: SCDs Start: 09/22/22 2210 apixaban (ELIQUIS) tablet 5 mg    Code Status: Full  Code Family Communication: No family currently at bedside  Disposition Plan:  Level of care: Telemetry Medical Status is: Observation The patient will require care spanning > 2 midnights and should be moved to inpatient because: Needs further cardiac work-up and will be getting a coronary CT scan as well as possibly a exercise stress test   Consultants:  Cardiology  Procedures:  As delineated as above  Antimicrobials:  Anti-infectives (From admission, onward)    None       Subjective: Seen and examined at bedside and states that anytime she gets up she started getting a "chest burning" feeling.  No nausea or vomiting.  States that is progressively gotten worse in the last few days.  No other concerns or plaints this time.  Objective: Vitals:   09/23/22 0640 09/23/22 0650 09/23/22 0933 09/23/22 1141  BP:   133/60   Pulse:  74 84   Resp:  17 18   Temp: 97.8 F (36.6 C)  98 F (36.7 C)   TempSrc: Oral  Oral   SpO2:  98% 97% 97%  Weight:      Height:        Intake/Output Summary (Last 24 hours) at 09/23/2022 1157 Last data filed at 09/22/2022 2354 Gross per 24 hour  Intake 100 ml  Output --  Net 100 ml   Filed Weights   09/22/22 1722  Weight: 68 kg   Examination: Physical Exam:  Constitutional: WN/WD overweight elderly Caucasian female  in NAD Respiratory: Diminished to auscultation bilaterally, no wheezing, rales, rhonchi or crackles. Normal respiratory effort and patient is not tachypenic. No accessory muscle use. Unlabored breathing  Cardiovascular: RRR, no murmurs / rubs / gallops. S1 and S2 auscultated. No extremity edema Abdomen: Soft, non-tender, mildly distended. Bowel sounds positive.  GU: Deferred. Musculoskeletal: No clubbing / cyanosis of digits/nails. No joint deformity upper and lower extremities.  Skin: No rashes, lesions, ulcers on a limited skin evaluation. No induration; Warm and dry.  Neurologic: CN 2-12 grossly intact with no focal deficits.  Romberg sign and cerebellar reflexes not assessed.  Psychiatric: Normal judgment and insight. Alert and oriented x 3. Normal mood and appropriate affect.   Data Reviewed: I have personally reviewed following labs and imaging studies  CBC: Recent Labs  Lab 09/22/22 1705 09/22/22 1719 09/23/22 0940  WBC 12.5*  --  11.0*  NEUTROABS 10.2*  --  8.7*  HGB 9.8* 10.2* 10.1*  HCT 30.5* 30.0* 31.5*  MCV 93.0  --  92.9  PLT 339  --  352   Basic Metabolic Panel: Recent Labs  Lab 09/22/22 1705 09/22/22 1719 09/23/22 0201 09/23/22 0940  NA 138 137 137  --   K 3.5 3.5 4.3  --   CL 101  --  100  --   CO2 26  --  26  --   GLUCOSE 102*  --  95  --   BUN 12  --  13  --   CREATININE 0.89  --  0.94  --   CALCIUM 8.9  --  8.7*  --   MG  --   --  2.3  --   PHOS  --   --   --  2.9   GFR: Estimated Creatinine Clearance: 40.3 mL/min (by C-G formula based on SCr of 0.94 mg/dL). Liver Function Tests: Recent Labs  Lab 09/22/22 1705  AST 17  ALT 15  ALKPHOS 64  BILITOT 0.6  PROT 5.8*  ALBUMIN 3.1*   No results for input(s): "LIPASE", "AMYLASE" in the last 168 hours. No results for input(s): "AMMONIA" in the last 168 hours. Coagulation Profile: No results for input(s): "INR", "PROTIME" in the last 168 hours. Cardiac Enzymes: No results for input(s): "CKTOTAL", "CKMB", "CKMBINDEX", "TROPONINI" in the last 168 hours. BNP (last 3 results) Recent Labs    03/08/22 1226 05/14/22 1432 05/24/22 1308  PROBNP 6,969* 2,909* 2,900*   HbA1C: No results for input(s): "HGBA1C" in the last 72 hours. CBG: No results for input(s): "GLUCAP" in the last 168 hours. Lipid Profile: No results for input(s): "CHOL", "HDL", "LDLCALC", "TRIG", "CHOLHDL", "LDLDIRECT" in the last 72 hours. Thyroid Function Tests: No results for input(s): "TSH", "T4TOTAL", "FREET4", "T3FREE", "THYROIDAB" in the last 72 hours. Anemia Panel: No results for input(s): "VITAMINB12", "FOLATE", "FERRITIN", "TIBC", "IRON",  "RETICCTPCT" in the last 72 hours. Sepsis Labs: No results for input(s): "PROCALCITON", "LATICACIDVEN" in the last 168 hours.  Recent Results (from the past 240 hour(s))  Resp Panel by RT-PCR (Flu A&B, Covid) Anterior Nasal Swab     Status: None   Collection Time: 09/22/22  5:28 PM   Specimen: Anterior Nasal Swab  Result Value Ref Range Status   SARS Coronavirus 2 by RT PCR NEGATIVE NEGATIVE Final    Comment: (NOTE) SARS-CoV-2 target nucleic acids are NOT DETECTED.  The SARS-CoV-2 RNA is generally detectable in upper respiratory specimens during the acute phase of infection. The lowest concentration of SARS-CoV-2 viral copies this assay can detect is 138  copies/mL. A negative result does not preclude SARS-Cov-2 infection and should not be used as the sole basis for treatment or other patient management decisions. A negative result may occur with  improper specimen collection/handling, submission of specimen other than nasopharyngeal swab, presence of viral mutation(s) within the areas targeted by this assay, and inadequate number of viral copies(<138 copies/mL). A negative result must be combined with clinical observations, patient history, and epidemiological information. The expected result is Negative.  Fact Sheet for Patients:  BloggerCourse.com  Fact Sheet for Healthcare Providers:  SeriousBroker.it  This test is no t yet approved or cleared by the [place] FDA and  has been authorized for detection and/or diagnosis of SARS-CoV-2 by FDA under an Emergency Use Authorization (EUA). This EUA will remain  in effect (meaning this test can be used) for the duration of the COVID-19 declaration under Section 564(b)(1) of the Act, 21 U.S.C.section 360bbb-3(b)(1), unless the authorization is terminated  or revoked sooner.       Influenza A by PCR NEGATIVE NEGATIVE Final   Influenza B by PCR NEGATIVE NEGATIVE Final     Comment: (NOTE) The Xpert Xpress SARS-CoV-2/FLU/RSV plus assay is intended as an aid in the diagnosis of influenza from Nasopharyngeal swab specimens and should not be used as a sole basis for treatment. Nasal washings and aspirates are unacceptable for Xpert Xpress SARS-CoV-2/FLU/RSV testing.  Fact Sheet for Patients: BloggerCourse.com  Fact Sheet for Healthcare Providers: SeriousBroker.it  This test is not yet approved or cleared by the [place] FDA and has been authorized for detection and/or diagnosis of SARS-CoV-2 by FDA under an Emergency Use Authorization (EUA). This EUA will remain in effect (meaning this test can be used) for the duration of the COVID-19 declaration under Section 564(b)(1) of the Act, 21 U.S.C. section 360bbb-3(b)(1), unless the authorization is terminated or revoked.  Performed at [facility], [address] [place], [place] [address]     Radiology Studies: DG Chest Port 1 View  Result Date: 09/22/2022 CLINICAL DATA:  Chest pain, difficulty breathing. EXAM: PORTABLE CHEST 1 VIEW COMPARISON:  None Available. FINDINGS: Heart is enlarged, unchanged. Both lungs are clear. The visualized skeletal structures are unremarkable. IMPRESSION: No active disease. Cardiomegaly. Electronically Signed   By: Darliss Cheney M.D.   On: 09/22/2022 17:44    Scheduled Meds:  apixaban  5 mg Oral BID   buPROPion  300 mg Oral Daily   clonazePAM  0.5 mg Oral BID   DULoxetine  60 mg Oral Daily   metoprolol tartrate  100 mg Oral BID   pantoprazole  40 mg Oral Daily   pregabalin  50 mg Oral Daily   Continuous Infusions:   LOS: 0 days   Marguerita Merles, DO [facility] Available via Epic secure chat 7am-7pm After these hours, please refer to coverage provider listed on [email or web address] 09/23/2022, 11:57 AM

## 2022-09-23 NOTE — Evaluation (Signed)
Physical Therapy Evaluation Patient Details Name: Julie Rice MRN: [id number] DOB: 03-Sep-1936 Today's Date: 09/23/2022  History of Present Illness  he patient is an 86 year old Caucasian female admitted 11/19 who presented to the emergency room with chest burning and palpitations.  PT with SVT and PAF and cardiac workup underway.  PMH: hypertension, rheumatoid arthritis, chronic diastolic CHF, COPD, chronic respiratory failure on 2-1/2 L of oxygen nightly, esophageal strictures as well as GERD and depression and anxiety  Clinical Impression  Pt admitted with above diagnosis. Pt was able to ambulate a short distance but fatigues quickly and needs mod assist to ambulate. Given that pt lives alone, will benefit from SNF prior to d/c home.  Will follow acutley.  Pt currently with functional limitations due to the deficits listed below (see PT Problem List). Pt will benefit from skilled PT to increase their independence and safety with mobility to allow discharge to the venue listed below.          Recommendations for follow up therapy are one component of a multi-disciplinary discharge planning process, led by the attending physician.  Recommendations may be updated based on patient status, additional functional criteria and insurance authorization.  Follow Up Recommendations Skilled nursing-short term rehab (<3 hours/day) Can patient physically be transported by private vehicle: Yes    Assistance Recommended at Discharge Frequent or constant Supervision/Assistance  Patient can return home with the following  A lot of help with walking and/or transfers;A lot of help with bathing/dressing/bathroom;Assistance with cooking/housework;Assist for transportation;Help with stairs or ramp for entrance    Equipment Recommendations None recommended by PT  Recommendations for Other Services       Functional Status Assessment Patient has had a recent decline in their functional status and demonstrates  the ability to make significant improvements in function in a reasonable and predictable amount of time.     Precautions / Restrictions Precautions Precautions: Fall Restrictions Weight Bearing Restrictions: No      Mobility  Bed Mobility Overal bed mobility: Needs Assistance Bed Mobility: Supine to Sit, Sit to Supine     Supine to sit: Min assist Sit to supine: Min assist   General bed mobility comments: Assist on and off stretcher with pt with posterior lean    Transfers Overall transfer level: Needs assistance Equipment used: 2 person hand held assist Transfers: Sit to/from Stand Sit to Stand: Min assist, Mod assist           General transfer comment: Pt needed incr steadying assist due to posterior lean and kyphotic posture. Needed HHA of 1 as pt usually ueses quad cane    Ambulation/Gait Ambulation/Gait assistance: Min assist, Mod assist Gait Distance (Feet): 30 Feet Assistive device: 1 person hand held assist Gait Pattern/deviations: Step-through pattern, Decreased stride length, Antalgic, Leaning posteriorly, Drifts right/left, Trunk flexed   Gait velocity interpretation: <1.31 ft/sec, indicative of household ambulator   General Gait Details: Pt able to ambulate a short distance hwoever fatigued quickly with desaturation and DOE 3/4 needing to come back and sit on stretcher. Pt also unsteady overall on feet.  Stairs            Wheelchair Mobility    Modified Rankin (Stroke Patients Only)       Balance Overall balance assessment: Needs assistance Sitting-balance support: No upper extremity supported, Feet supported, Bilateral upper extremity supported Sitting balance-Leahy Scale: Poor Sitting balance - Comments: Needed support at times   Standing balance support: Single extremity supported, During functional activity Standing balance-Leahy  Scale: Poor Standing balance comment: relies on at least 1 UE support and really needed 2 UEs support                              Pertinent Vitals/Pain Pain Assessment Pain Assessment: Faces Faces Pain Scale: Hurts even more Pain Location: left knee Pain Descriptors / Indicators: Aching, Grimacing, Guarding Pain Intervention(s): Limited activity within patient's tolerance, Monitored during session, Repositioned    Home Living Family/patient expects to be discharged to:: Private residence Living Arrangements: Alone Available Help at Discharge: Family;Available PRN/intermittently (brother an ssister in Social worker and neighbor) Type of Home: House Home Access: Stairs to enter Entrance Stairs-Rails: None Entrance Stairs-Number of Steps: 2   Home Layout: One level Home Equipment: Agricultural consultant (2 wheels);BSC/3in1;Tub bench;Cane - quad;Grab bars - tub/shower (home O2)      Prior Function               Mobility Comments: Pt used quad cane to walk, has had a few falls per pt ADLs Comments: I PTA     Hand Dominance   Dominant Hand: Right    Extremity/Trunk Assessment   Upper Extremity Assessment Upper Extremity Assessment: Defer to OT evaluation    Lower Extremity Assessment Lower Extremity Assessment: Generalized weakness    Cervical / Trunk Assessment Cervical / Trunk Assessment: Kyphotic  Communication   Communication: No difficulties  Cognition Arousal/Alertness: Awake/alert Behavior During Therapy: WFL for tasks assessed/performed Overall Cognitive Status: Within Functional Limits for tasks assessed                                          General Comments General comments (skin integrity, edema, etc.): Pt uses 2.5LO2 at night at home.  Resting VS 64 bpm, 93% on 1.5L, 161/64.  Desaturated to 85% on RA therefore replaced O2.    Exercises     Assessment/Plan    PT Assessment Patient needs continued PT services  PT Problem List Decreased activity tolerance;Decreased balance;Decreased mobility;Decreased knowledge of use of DME;Decreased  safety awareness;Decreased knowledge of precautions;Cardiopulmonary status limiting activity       PT Treatment Interventions DME instruction;Gait training;Functional mobility training;Therapeutic activities;Therapeutic exercise;Balance training;Patient/family education;Cognitive remediation    PT Goals (Current goals can be found in the Care Plan section)  Acute Rehab PT Goals Patient Stated Goal: to go home after SNF PT Goal Formulation: With patient Time For Goal Achievement: 10/07/22 Potential to Achieve Goals: Good    Frequency Min 2X/week     Co-evaluation               AM-PAC PT "6 Clicks" Mobility  Outcome Measure Help needed turning from your back to your side while in a flat bed without using bedrails?: A Little Help needed moving from lying on your back to sitting on the side of a flat bed without using bedrails?: A Lot Help needed moving to and from a bed to a chair (including a wheelchair)?: A Lot Help needed standing up from a chair using your arms (e.g., wheelchair or bedside chair)?: A Lot Help needed to walk in hospital room?: A Lot Help needed climbing 3-5 steps with a railing? : A Lot 6 Click Score: 13    End of Session Equipment Utilized During Treatment: Gait belt;Oxygen Activity Tolerance: Patient limited by fatigue Patient left: with call bell/phone within reach;with  family/visitor present (on stretcher) Nurse Communication: Mobility status PT Visit Diagnosis: Unsteadiness on feet (R26.81)    Time: 1510-1535 PT Time Calculation (min) (ACUTE ONLY): 25 min   Charges:   PT Evaluation $PT Eval Moderate Complexity: 1 Mod PT Treatments $Gait Training: 8-22 mins        [facility] M,PT Acute Rehab Services [phone]   Bevelyn Buckles 09/23/2022, 4:48 PM

## 2022-09-23 NOTE — Progress Notes (Signed)
Pt arrived from ED, VSS, CHG complete, orders checked, pt oriented to unit, call light within reach.   Balinda Quails, RN 09/23/2022 4:54 PM

## 2022-09-23 NOTE — Consult Note (Addendum)
Cardiology Consultation   Patient ID: Julie Rice MRN: [id number]; DOB: 1936-05-03  Admit date: 09/22/2022 Date of Consult: 09/23/2022  PCP:  Shirline Frees, NP    [facility] Providers Cardiologist:  Charlton Haws, MD  Electrophysiologist:  Regan Lemming, MD       Patient Profile:   Julie Rice is a 86 y.o. female with a hx of non-obs CAD 2020 cath, CHF, SVT s/p ablation and recurrence, Afib, TIA, COPD, esoph strictures, HTN, RA, who is being seen 09/23/2022 for the evaluation of chest pain at the request of Dr Marland Mcalpine.  History of Present Illness:   [name] has been having episodes of chest pain off/on for a couple of years.  The pain is a burning, can be severe, starts at lower sternal edge, radiates up and over to both shoulders, down both arms.   The pain can be brought on by exertion, it does not start when she is sitting still.   Sx have worsened in the last 2-3 weeks, the pain will start just by sitting up. Tylenol will decrease but not relieve the pain. She has been very weak over the last week, not able to do anything because of the pain. She has been nauseated but no vomiting. Po intake has been decreased.  She came to the ER because the pain was severe, 10/10. In the ER, she got Mylanta and Bentyl. The pain decreased to a 4/10. It did not completely go away. She has had some worsening pain episodes since then, but generally feels a little better. The pain has not been 0/10 in over a week.   She has had this in the past, was told it was from her Afib.   She has not had palpitations with every episode, but has had with some of them. A recent monitor correlated sx w/ SVT.  She has LE edema at times, has noticed this once in the last week.   She took an extra Lasix, which helped.   She has L lower facial numbness that also started about 10 days ago. It waxes and wanes, does not resolve.   She is developing neuropathy in her feet, they also  tend to be numb.    Past Medical History:  Diagnosis Date   A-fib (HCC)    Anemia    Anxiety    Bipolar disorder (HCC)    Blood transfusion 1991   autologous pts own blood given    CAD (coronary artery disease)    Cataract    Chest pain    "@ rest, lying down, w/exertion"   CHF (congestive heart failure) (HCC) 07/2020   Chronic back pain    "mostly lower back but I do have upper back pain regularly" (04/06/2018)   COPD (chronic obstructive pulmonary disease) (HCC)    Depression    Esophageal stricture    Fibromyalgia    GERD (gastroesophageal reflux disease)    Heart murmur    "slight" (04/06/2018)   Hiatal hernia    Hyperlipidemia    Patient denies   Hypertension    Internal hemorrhoids    Lumbago    Mitral regurgitation    Osteoarthritis    Osteoporosis    Pneumonia ~ 01/2018   PONV (postoperative nausea and vomiting)    severe ponv, "in the past" (04/06/2018)   Rectal bleeding    Rheumatoid arthritis (HCC)    Spondylosis    TIA (transient ischemic attack) 2013   Urinary incontinence  wears depends    Past Surgical History:  Procedure Laterality Date   ABDOMINAL HYSTERECTOMY  1982   BACK SURGERY     BALLOON DILATION N/A 09/21/2018   Procedure: BALLOON DILATION;  Surgeon: Hilarie Fredrickson, MD;  Location: [facility] ENDOSCOPY;  Service: Endoscopy;  Laterality: N/A;   BALLOON DILATION N/A 08/12/2019   Procedure: BALLOON DILATION;  Surgeon: Lynann Bologna, MD;  Location: [facility] ENDOSCOPY;  Service: Endoscopy;  Laterality: N/A;   BIOPSY  08/12/2019   Procedure: BIOPSY;  Surgeon: Lynann Bologna, MD;  Location: [facility] ENDOSCOPY;  Service: Endoscopy;;   BLADDER SUSPENSION  1980's   BOTOX INJECTION N/A 09/21/2018   Procedure: BOTOX INJECTION;  Surgeon: Hilarie Fredrickson, MD;  Location: [facility] ENDOSCOPY;  Service: Endoscopy;  Laterality: N/A;   BOTOX INJECTION N/A 08/12/2019   Procedure: BOTOX INJECTION;  Surgeon: Lynann Bologna, MD;  Location: [facility] ENDOSCOPY;  Service: Endoscopy;  Laterality: N/A;    CATARACT EXTRACTION W/ INTRAOCULAR LENS  IMPLANT, BILATERAL Bilateral 2010   DILATION AND CURETTAGE OF UTERUS  1961   ESOPHAGEAL MANOMETRY N/A 03/25/2018   Procedure: ESOPHAGEAL MANOMETRY (EM);  Surgeon: Napoleon Form, MD;  Location: [facility] ENDOSCOPY;  Service: Endoscopy;  Laterality: N/A;   ESOPHAGOGASTRODUODENOSCOPY (EGD) WITH PROPOFOL N/A 04/07/2018   Procedure: ESOPHAGOGASTRODUODENOSCOPY (EGD) WITH PROPOFOL;  Surgeon: Sherrilyn Rist, MD;  Location: [facility] ENDOSCOPY;  Service: Gastroenterology;  Laterality: N/A;   ESOPHAGOGASTRODUODENOSCOPY (EGD) WITH PROPOFOL N/A 09/21/2018   Procedure: ESOPHAGOGASTRODUODENOSCOPY (EGD) WITH PROPOFOL;  Surgeon: Hilarie Fredrickson, MD;  Location: [facility] ENDOSCOPY;  Service: Endoscopy;  Laterality: N/A;   ESOPHAGOGASTRODUODENOSCOPY (EGD) WITH PROPOFOL N/A 08/12/2019   Procedure: ESOPHAGOGASTRODUODENOSCOPY (EGD) WITH PROPOFOL;  Surgeon: Lynann Bologna, MD;  Location: [facility] ENDOSCOPY;  Service: Endoscopy;  Laterality: N/A;   HERNIA REPAIR     HIATAL HERNIA REPAIR N/A 08/02/2016   Procedure: LAPAROSCOPIC REPAIR OF LARGE  HIATAL HERNIA;  Surgeon: Glenna Fellows, MD;  Location: [facility] ORS;  Service: General;  Laterality: N/A;   JOINT REPLACEMENT     LAPAROSCOPIC NISSEN FUNDOPLICATION N/A 08/02/2016   Procedure: LAPAROSCOPIC NISSEN FUNDOPLICATION;  Surgeon: Glenna Fellows, MD;  Location: [facility] ORS;  Service: General;  Laterality: N/A;   LUMBAR LAMINECTOMY  1990; 1994; [id number]   "I've got 2 stainless steel rods; 6 screws; 2 ray cages"took bone from right hip to put in back   RIGHT/LEFT HEART CATH AND CORONARY ANGIOGRAPHY N/A 07/26/2019   Procedure: RIGHT/LEFT HEART CATH AND CORONARY ANGIOGRAPHY;  Surgeon: Lyn Records, MD;  Location: [facility] INVASIVE CV LAB;  Service: Cardiovascular;  Laterality: N/A;   SVT ABLATION N/A 04/25/2021   Procedure: SVT ABLATION;  Surgeon: Regan Lemming, MD;  Location: [facility] INVASIVE CV LAB;  Service: Cardiovascular;  Laterality: N/A;   TEE WITHOUT  CARDIOVERSION N/A 08/19/2019   Procedure: TRANSESOPHAGEAL ECHOCARDIOGRAM (TEE);  Surgeon: Lewayne Bunting, MD;  Location: [facility] ENDOSCOPY;  Service: Cardiovascular;  Laterality: N/A;   TONSILLECTOMY AND ADENOIDECTOMY  1945   TOTAL KNEE ARTHROPLASTY Right ~ 1996   TUBAL LIGATION  ~ 1976     Home Medications:  Prior to Admission medications   Medication Sig Start Date End Date Taking? Authorizing Provider  acetaminophen (TYLENOL) 500 MG tablet Take 1,000 mg by mouth every 6 (six) hours as needed for moderate pain, headache or fever.   Yes [provider]  albuterol (VENTOLIN HFA) 108 (90 Base) MCG/ACT inhaler TAKE 2 PUFFS BY MOUTH EVERY 6 HOURS AS NEEDED FOR WHEEZE OR SHORTNESS OF BREATH Patient taking differently: Inhale 2  puffs into the lungs every 6 (six) hours as needed for wheezing or shortness of breath. 08/06/22  Yes Nafziger, [name], NP  buPROPion (WELLBUTRIN XL) 300 MG 24 hr tablet TAKE 1 TABLET BY MOUTH EVERY DAY 08/06/22  Yes Nafziger, [name], NP  Carboxymethylcellulose Sodium (REFRESH TEARS OP) Place 1 drop into both eyes 2 (two) times daily.   Yes [provider]  cetirizine (ZYRTEC) 10 MG tablet Take 10 mg by mouth daily as needed for allergies. 02/20/21  Yes [provider]  clonazePAM (KLONOPIN) 0.5 MG tablet Take 0.5 tablets (0.25 mg total) by mouth 2 (two) times daily as needed for anxiety. 09/11/22 10/11/22 Yes Nafziger, [name], NP  DULoxetine (CYMBALTA) 60 MG capsule TAKE 1 CAPSULE BY MOUTH EVERY DAY Patient taking differently: Take 60 mg by mouth daily. 11/30/21  Yes Lovorn, Megan, MD  ELIQUIS 5 MG TABS tablet TAKE 1 TABLET BY MOUTH TWICE A DAY Patient taking differently: Take 5 mg by mouth 2 (two) times daily. 05/08/22  Yes Camnitz, Will Daphine Deutscher, MD  FeFum-FePoly-FA-B Cmp-C-Biot (INTEGRA PLUS) CAPS Take 1 capsule by mouth every morning. 08/30/22  Yes Heilingoetter, Cassandra L, PA-C  KLOR-CON M20 20 MEQ tablet TAKE 1 TABLET BY MOUTH EVERY DAY 08/06/22  Yes Weaver,  Scott T, PA-C  losartan (COZAAR) 50 MG tablet TAKE 1 TABLET BY MOUTH EVERY DAY 07/18/22  Yes Camnitz, Andree Coss, MD  metoprolol tartrate (LOPRESSOR) 50 MG tablet Take 1.5 tablets (75 mg total) by mouth 2 (two) times daily. 07/18/22  Yes Camnitz, Will Daphine Deutscher, MD  Multiple Vitamins-Minerals (ONE-A-DAY WOMENS 50+) TABS Take 1 tablet by mouth daily.   Yes [provider]  omeprazole (PRILOSEC) 20 MG capsule TAKE 1 CAPSULE BY MOUTH EVERY DAY Patient taking differently: Take 20 mg by mouth daily. 08/28/22  Yes Nafziger, [name], NP  pregabalin (LYRICA) 50 MG capsule TAKE 1 CAPSULE BY MOUTH EVERY DAY Patient taking differently: Take 50 mg by mouth daily. 09/05/22  Yes Nafziger, [name], NP  sodium chloride (OCEAN) 0.65 % SOLN nasal spray Place 1 spray into both nostrils as needed for congestion.   Yes [provider]  spironolactone (ALDACTONE) 25 MG tablet Take 25 mg by mouth daily. 05/26/22  Yes [provider]    Inpatient Medications: Scheduled Meds:  apixaban  5 mg Oral BID   buPROPion  300 mg Oral Daily   clonazePAM  0.5 mg Oral BID   DULoxetine  60 mg Oral Daily   metoprolol tartrate  100 mg Oral BID   pantoprazole  40 mg Oral Daily   pregabalin  50 mg Oral Daily   Continuous Infusions:  PRN Meds: acetaminophen **OR** acetaminophen, bismuth subsalicylate, senna-docusate  Allergies:    Allergies  Allergen Reactions   Tape Other (See Comments)    Band aides, adhesive tape Redness and pulls skin off   Tramadol Other (See Comments)    Pt has prolonged Qtc interval of 540- cannot give tramadol per pharmacy   Morphine Itching and Rash    Flushing    Social History:   Social History   Socioeconomic History   Marital status: Widowed    Spouse name: Not on file   Number of children: 2   Years of education: 48   Highest education level: 12th grade  Occupational History   Occupation: retired    Associate Professor: RETIRED  Tobacco Use   Smoking status: Never    Smokeless tobacco: Never  Vaping Use   Vaping Use: Never used  Substance and Sexual  Activity   Alcohol use: Never   Drug use: Never   Sexual activity: Not Currently    Comment: 1st intercourse 36 yo-1 partner  Other Topics Concern   Not on file  Social History Narrative   Retired    Widowed   Right handed   Social Determinants of Health   Financial Resource Strain: Low Risk  (02/14/2022)   Overall Financial Resource Strain (CARDIA)    Difficulty of Paying Living Expenses: Not hard at all  Food Insecurity: No Food Insecurity (02/14/2022)   Hunger Vital Sign    Worried About Running Out of Food in the Last Year: Never true    Ran Out of Food in the Last Year: Never true  Transportation Needs: No Transportation Needs (02/14/2022)   PRAPARE - Administrator, Civil Service (Medical): No    Lack of Transportation (Non-Medical): No  Physical Activity: Inactive (02/14/2022)   Exercise Vital Sign    Days of Exercise per Week: 0 days    Minutes of Exercise per Session: 0 min  Stress: No Stress Concern Present (02/14/2022)   [employer] of Occupational Health - Occupational Stress Questionnaire    Feeling of Stress : Not at all  Social Connections: Moderately Integrated (02/14/2022)   Social Connection and Isolation Panel [NHANES]    Frequency of Communication with Friends and Family: More than three times a week    Frequency of Social Gatherings with Friends and Family: More than three times a week    Attends Religious Services: More than 4 times per year    Active Member of [employer] or Organizations: Yes    Attends Banker Meetings: More than 4 times per year    Marital Status: Widowed  Intimate Partner Violence: Not At Risk (02/14/2022)   Humiliation, Afraid, Rape, and Kick questionnaire    Fear of Current or Ex-Partner: No    Emotionally Abused: No    Physically Abused: No    Sexually Abused: No    Family History:   Family History  Problem Relation Age  of Onset   Kidney disease Mother    Hypertension Mother    Heart disease Father 79       die of MI at age 42   Heart disease Brother    Heart attack Brother    Suicidality Son    Other Son        MVA   Colon cancer Neg Hx    Esophageal cancer Neg Hx    Pancreatic cancer Neg Hx    Rectal cancer Neg Hx    Stomach cancer Neg Hx      ROS:  Please see the history of present illness.  All other ROS reviewed and negative.     Physical Exam/Data:   Vitals:   09/23/22 0630 09/23/22 0640 09/23/22 0650 09/23/22 0933  BP:    133/60  Pulse: 84  74 84  Resp: 20  17 18   Temp:  97.8 F (36.6 C)  98 F (36.7 C)  TempSrc:  Oral  Oral  SpO2: 95%  98% 97%  Weight:      Height:        Intake/Output Summary (Last 24 hours) at 09/23/2022 0949 Last data filed at 09/22/2022 2354 Gross per 24 hour  Intake 100 ml  Output --  Net 100 ml      09/22/2022    5:22 PM 08/27/2022   12:24 PM 08/15/2022    2:20 PM  Last  3 Weights  Weight (lbs) 150 lb 154 lb 8 oz 152 lb  Weight (kg) 68.04 kg 70.081 kg 68.947 kg     Body mass index is 27.44 kg/m.  General:  Well nourished, well developed, elderly female in no acute distress HEENT: normal Neck: no JVD Vascular: No carotid bruits; Distal pulses 2+ bilaterally Cardiac:  normal S1, S2; RRR; no murmur  Lungs:  clear to auscultation bilaterally, no wheezing, rhonchi or rales  Abd: soft, nontender, no hepatomegaly  Ext: no edema Musculoskeletal:  No deformities, BUE and BLE strength weak but equal Skin: warm and dry  Neuro:  CNs 2-12 intact, no focal abnormalities noted Psych:  Normal affect   EKG:  The EKG was personally reviewed and demonstrates:  ST, freq PVCs Telemetry:  Telemetry was personally reviewed and demonstrates:  SR, ST, PVCs  Relevant CV Studies:  7 DAY MONITOR: 03/20/2022 Patch wear time was 6 days and 23 hours Predominant rhythm was NSR with average HR 65bpm 14 runs of SVT with longest lasting 2 hours and at HR  128bpm Occasional SVE (1.1%), rare VE (<1%) Patient triggered events correlate with SVT and PVCs No Afib or significant pauses seen  ECHO: 03/15/2022  1. Left ventricular ejection fraction, by estimation, is 50 to 55%. The left ventricle has low normal function. The left ventricle has no regional wall motion abnormalities. There is mild left ventricular hypertrophy. Left ventricular diastolic  parameters are consistent with Grade II diastolic dysfunction  (pseudonormalization). Elevated left atrial pressure.   2. Right ventricular systolic function is normal. The right ventricular size is mildly enlarged. There is moderately elevated pulmonary artery systolic pressure. The estimated right ventricular systolic pressure is 57.5 mmHg.   3. Left atrial size was severely dilated.   4. Right atrial size was mildly dilated.   5. The mitral valve is degenerative. Mild to moderate mitral valve regurgitation. No evidence of mitral stenosis. Moderate mitral annular calcification.   6. Tricuspid valve regurgitation is moderate.   7. The aortic valve is tricuspid. Aortic valve regurgitation is mild. Aortic valve sclerosis is present, with no evidence of aortic valve stenosis.   8. The inferior vena cava is normal in size with greater than 50% respiratory variability, suggesting right atrial pressure of 3 mmHg.   CARDIAC CATH: 07/26/2019 Right dominant coronary anatomy Widely patent left main 30 to 40% mid LAD Widely patent circumflex Luminal irregularities in a dominant right coronary. Right heart pressures are relatively low.  Pulmonary wedge pressure mean is 2 mmHg. LVEDP 4 mmHg.  Left ventriculography by hand-injection was not helpful.  From images obtained, LV function appears relatively normal.   Laboratory Data:  High Sensitivity Troponin:   Recent Labs  Lab 09/22/22 1705 09/22/22 2000 09/23/22 0201  TROPONINIHS 9 11 11      Chemistry Recent Labs  Lab 09/22/22 1705 09/22/22 1719  09/23/22 0201  NA 138 137 137  K 3.5 3.5 4.3  CL 101  --  100  CO2 26  --  26  GLUCOSE 102*  --  95  BUN 12  --  13  CREATININE 0.89  --  0.94  CALCIUM 8.9  --  8.7*  MG  --   --  2.3  GFRNONAA >60  --  60*  ANIONGAP 11  --  11    Recent Labs  Lab 09/22/22 1705  PROT 5.8*  ALBUMIN 3.1*  AST 17  ALT 15  ALKPHOS 64  BILITOT 0.6   Lipids No  results for input(s): "CHOL", "TRIG", "HDL", "LABVLDL", "LDLCALC", "CHOLHDL" in the last 168 hours.  Hematology Recent Labs  Lab 09/22/22 1705 09/22/22 1719  WBC 12.5*  --   RBC 3.28*  --   HGB 9.8* 10.2*  HCT 30.5* 30.0*  MCV 93.0  --   MCH 29.9  --   MCHC 32.1  --   RDW 15.0  --   PLT 339  --    Thyroid No results for input(s): "TSH", "FREET4" in the last 168 hours.  BNP Recent Labs  Lab 09/22/22 1730  BNP 438.0*    DDimer  Recent Labs  Lab 09/22/22 1705  DDIMER <0.27     Radiology/Studies:  DG Chest Port 1 View  Result Date: 09/22/2022 CLINICAL DATA:  Chest pain, difficulty breathing. EXAM: PORTABLE CHEST 1 VIEW COMPARISON:  None Available. FINDINGS: Heart is enlarged, unchanged. Both lungs are clear. The visualized skeletal structures are unremarkable. IMPRESSION: No active disease. Cardiomegaly. Electronically Signed   By: Darliss Cheney M.D.   On: 09/22/2022 17:44     Assessment and Plan:   Chest pain - ez neg MI despite prolonged sx - unclear if sx brought on by tachycardia - hx non-obs dz by cath 2020 - get coronary CT, today if possible  2. Palpitations, SVT on recent monitor -  gets them when she gets out of bed or out of a chair - may need to try a treadmill to reproduce sx if CT is ok  Otherwise, per IM   Risk Assessment/Risk Scores:        CHA2DS2-VASc Score = 8   This indicates a 10.8% annual risk of stroke. The patient's score is based upon: CHF History: 1 HTN History: 1 Diabetes History: 0 Stroke History: 2 Vascular Disease History: 1 Age Score: 2 Gender Score: 1    For  questions or updates, please contact  [facility] Please consult [email or web address] for contact info under    Signed, Theodore Demark, PA-C  09/23/2022 9:49 AM  I have seen and examined the patient along with Theodore Demark, PA-C .  I have reviewed the chart, notes and new data.  I agree with PA/NP's note.  Key new complaints: She has a constant sensation of pressure in the retrosternal area for over 10 days, but the symptoms are clearly worsened by getting up and walking.  She can only take a few steps before she becomes extremely short of breath and the chest tightness worsens.  She feels her heart racing. Key examination changes: Appears comfortable lying fully supine in bed.  30 minutes pulsations are normal does not have edema.  Clear lungs.  Regular rate and rhythm with frequent ectopic beats, no murmurs or rubs. Key new findings / data: Reviewed the echocardiogram from May 2023 that showed mild LVH with grade 2 diastolic dysfunction, biatrial dilation, elevated left atrial filling pressures and a PA pressure estimated at 57 mmHg.  Also reviewed the arrhythmia monitor which shows a regular tachycardia with abrupt onset and abrupt termination (consider atrial flutter with 2:1 AV block or other type of reentry tachycardia).  Cardiac enzymes are normal.  ECG is nonischemic at rest with frequent PVCs.  PLAN: Recommend coronary CT angiography to evaluate for progression of known moderate left coronary artery stenosis.  If this is negative I would do a plain treadmill ECG test, not look for ischemia but to see if indeed there is exercise related tachyarrhythmia. Ultimately, it is possible that that her chest discomfort is related to  worsening pulmonary artery hypertension with activity.   The cause for pulmonary hypertension is not entirely clear.  There was evidence of left ventricular diastolic failure on her echocardiogram from May 2023.  She carries a diagnosis of COPD, but has never smoked  personally (her husband of 60 years was a smoker; she did work at [employer] for a few years).  Chest x-ray does not show any overt pulmonary structural abnormalities.  May require a right heart catheterization.   Thurmon Fair, MD, [facility] [phone] 09/23/2022, 11:46 AM

## 2022-09-24 DIAGNOSIS — E785 Hyperlipidemia, unspecified: Secondary | ICD-10-CM

## 2022-09-24 DIAGNOSIS — I2721 Secondary pulmonary arterial hypertension: Secondary | ICD-10-CM

## 2022-09-24 DIAGNOSIS — I498 Other specified cardiac arrhythmias: Secondary | ICD-10-CM

## 2022-09-24 DIAGNOSIS — R9431 Abnormal electrocardiogram [ECG] [EKG]: Secondary | ICD-10-CM | POA: Diagnosis not present

## 2022-09-24 DIAGNOSIS — K222 Esophageal obstruction: Secondary | ICD-10-CM

## 2022-09-24 DIAGNOSIS — I499 Cardiac arrhythmia, unspecified: Secondary | ICD-10-CM | POA: Diagnosis not present

## 2022-09-24 DIAGNOSIS — I25118 Atherosclerotic heart disease of native coronary artery with other forms of angina pectoris: Secondary | ICD-10-CM

## 2022-09-24 DIAGNOSIS — I480 Paroxysmal atrial fibrillation: Secondary | ICD-10-CM | POA: Diagnosis not present

## 2022-09-24 DIAGNOSIS — I471 Supraventricular tachycardia, unspecified: Secondary | ICD-10-CM | POA: Diagnosis not present

## 2022-09-24 LAB — CBC WITH DIFFERENTIAL/PLATELET
Abs Immature Granulocytes: 0.06 10*3/uL (ref 0.00–0.07)
Basophils Absolute: 0.1 10*3/uL (ref 0.0–0.1)
Basophils Relative: 1 %
Eosinophils Absolute: 0.3 10*3/uL (ref 0.0–0.5)
Eosinophils Relative: 2 %
HCT: 32.2 % — ABNORMAL LOW (ref 36.0–46.0)
Hemoglobin: 10 g/dL — ABNORMAL LOW (ref 12.0–15.0)
Immature Granulocytes: 1 %
Lymphocytes Relative: 10 %
Lymphs Abs: 1.2 10*3/uL (ref 0.7–4.0)
MCH: 29.2 pg (ref 26.0–34.0)
MCHC: 31.1 g/dL (ref 30.0–36.0)
MCV: 94.2 fL (ref 80.0–100.0)
Monocytes Absolute: 1.1 10*3/uL — ABNORMAL HIGH (ref 0.1–1.0)
Monocytes Relative: 9 %
Neutro Abs: 9.7 10*3/uL — ABNORMAL HIGH (ref 1.7–7.7)
Neutrophils Relative %: 77 %
Platelets: 354 10*3/uL (ref 150–400)
RBC: 3.42 MIL/uL — ABNORMAL LOW (ref 3.87–5.11)
RDW: 15.1 % (ref 11.5–15.5)
WBC: 12.4 10*3/uL — ABNORMAL HIGH (ref 4.0–10.5)
nRBC: 0 % (ref 0.0–0.2)

## 2022-09-24 LAB — COMPREHENSIVE METABOLIC PANEL
ALT: 14 U/L (ref 0–44)
AST: 16 U/L (ref 15–41)
Albumin: 3.3 g/dL — ABNORMAL LOW (ref 3.5–5.0)
Alkaline Phosphatase: 58 U/L (ref 38–126)
Anion gap: 8 (ref 5–15)
BUN: 10 mg/dL (ref 8–23)
CO2: 27 mmol/L (ref 22–32)
Calcium: 9.1 mg/dL (ref 8.9–10.3)
Chloride: 102 mmol/L (ref 98–111)
Creatinine, Ser: 0.89 mg/dL (ref 0.44–1.00)
GFR, Estimated: >60 mL/min (ref >60)
Glucose, Bld: 150 mg/dL — ABNORMAL HIGH (ref 70–99)
Potassium: 4.9 mmol/L (ref 3.5–5.1)
Sodium: 137 mmol/L (ref 135–145)
Total Bilirubin: 0.3 mg/dL (ref 0.3–1.2)
Total Protein: 5.8 g/dL — ABNORMAL LOW (ref 6.5–8.1)

## 2022-09-24 LAB — PHOSPHORUS: Phosphorus: 3.4 mg/dL (ref 2.5–4.6)

## 2022-09-24 LAB — TSH: TSH: 1.528 u[IU]/mL (ref 0.350–4.500)

## 2022-09-24 LAB — MAGNESIUM: Magnesium: 2.4 mg/dL (ref 1.7–2.4)

## 2022-09-24 MED ORDER — AMIODARONE HCL 200 MG PO TABS
200.0000 mg | ORAL_TABLET | Freq: Every day | ORAL | Status: DC
  Administered 2022-09-24 – 2022-09-29 (×6): 200 mg via ORAL
  Filled 2022-09-24 (×6): qty 1

## 2022-09-24 MED ORDER — PANTOPRAZOLE SODIUM 40 MG PO TBEC
40.0000 mg | DELAYED_RELEASE_TABLET | Freq: Two times a day (BID) | ORAL | Status: DC
  Administered 2022-09-24 – 2022-09-25 (×2): 40 mg via ORAL
  Filled 2022-09-24 (×2): qty 1

## 2022-09-24 NOTE — [place] FL2 (Addendum)
[place] MEDICAID FL2 LEVEL OF CARE SCREENING TOOL     IDENTIFICATION  Patient Name: Julie Rice Birthdate: [birth year] Sex: female Admission Date (Current Location): 09/22/2022  [facility] and [place] Number:  Producer, television/film/video and Address:  [facility], [address], [place], [place] [address]      Provider Number: [id number]  Attending Physician Name and Address:  Merlene Laughter, DO  Relative Name and Phone Number:  Varsha, Knock)  [phone] [facility])    Current Level of Care: Hospital Recommended Level of Care: Skilled Nursing Facility Prior Approval Number:    Date Approved/Denied:   PASRR Number: [id number]  Expires 10/24/2022  Discharge Plan: SNF    Current Diagnoses: Patient Active Problem List   Diagnosis Date Noted   Arrhythmia 09/22/2022   Idiopathic peripheral neuropathy 07/31/2022   Paroxysmal SVT (supraventricular tachycardia) 05/14/2022   Coronary artery disease involving native coronary artery of native heart without angina pectoris 05/14/2022   Wide-complex tachycardia 04/03/2021   Atrial fibrillation (HCC) 10/15/2020   Chronic pain syndrome 02/16/2020   Fibromyalgia 02/16/2020   Myofascial pain 02/16/2020   Rheumatoid arthritis involving multiple sites (HCC) 02/16/2020   Pleural effusion, bilateral 09/08/2019   Secondary cardiomyopathy (HCC)    Elevated troponin 08/17/2019   Chronic respiratory failure with hypoxia (HCC) 08/16/2019   COPD (chronic obstructive pulmonary disease) (HCC) 08/16/2019   PAF (paroxysmal atrial fibrillation) (HCC) 08/16/2019   Community acquired pneumonia of left lung 08/07/2019   Prolonged QT interval 08/07/2019   Sepsis (HCC) 08/07/2019   Acute on chronic respiratory failure with hypoxia (HCC) 08/07/2019   (HFpEF) heart failure with preserved ejection fraction (HCC) 07/24/2019   Chest pain    DOE (dyspnea on exertion)    Asthma 04/22/2019   Abnormal CT of the chest  02/23/2019   Esophageal motility disorder    Esophageal dysmotility    Loss of weight    Moderate protein-calorie malnutrition (HCC)    Hematemesis 04/06/2018   Periesophageal hiatal hernia 08/02/2016   Dysphagia 05/10/2016   Esophageal stricture 05/10/2016   Epigastric pain 05/10/2016   Generalized anxiety disorder 02/06/2012   Major depressive disorder, recurrent (HCC) 02/05/2012   Hypokalemia 11/09/2011   Nausea and vomiting in adult 11/08/2011   Dehydration 11/08/2011   Tremors of nervous system 11/08/2011   Hypothyroidism 05/10/2008   Dyslipidemia 05/10/2008   UNSPECIFIED ANEMIA 05/10/2008   CAROTID ARTERY DISEASE 05/10/2008   Transient cerebral ischemia 05/10/2008   Chronic back pain 05/10/2008   OSTEOPENIA 05/10/2008   Dyspnea 05/10/2008   Upper airway cough syndrome 05/10/2008   ESOPHAGEAL STRICTURE 01/19/2008   Essential hypertension 04/29/2007   Hiatal hernia with gastroesophageal reflux 04/29/2007   Osteoarthritis 04/29/2007   SPONDYLOSIS 04/29/2007    Orientation RESPIRATION BLADDER Height & Weight     Self, Time, Situation, Place  Normal External catheter Weight: 144 lb 8 oz (65.5 kg) Height:  5\' 2"  (157.5 cm)  BEHAVIORAL SYMPTOMS/MOOD NEUROLOGICAL BOWEL NUTRITION STATUS      Continent Diet (see d/c summary)  AMBULATORY STATUS COMMUNICATION OF NEEDS Skin   Extensive Assist Verbally Normal                       Personal Care Assistance Level of Assistance  Bathing, Feeding, Dressing Bathing Assistance: Limited assistance Feeding assistance: Independent Dressing Assistance: Limited assistance     Functional Limitations Info  Sight, Hearing, Speech Sight Info: Adequate Hearing Info: Adequate Speech Info: Adequate  SPECIAL CARE FACTORS FREQUENCY  PT (By licensed PT), OT (By licensed OT)     PT Frequency: 5x/week OT Frequency: 5x/week            Contractures Contractures Info: Not present    Additional Factors Info  Code Status,  Allergies Code Status Info: full code Allergies Info: Tape, tramadol, morphine           Current Medications (09/24/2022):  This is the current hospital active medication list Current Facility-Administered Medications  Medication Dose Route Frequency Provider Last Rate Last Admin   acetaminophen (TYLENOL) tablet 650 mg  650 mg Oral Q6H PRN Crosley, Debby, MD       Or   acetaminophen (TYLENOL) suppository 650 mg  650 mg Rectal Q6H PRN Crosley, Debby, MD       albuterol (PROVENTIL) (2.5 MG/3ML) 0.083% nebulizer solution 2.5 mg  2.5 mg Inhalation Q6H PRN Sheikh, Omair Latif, DO       apixaban (ELIQUIS) tablet 5 mg  5 mg Oral BID Crosley, Debby, MD   5 mg at 09/24/22 0827   bismuth subsalicylate (PEPTO BISMOL) 262 MG/15ML suspension 30 mL  30 mL Oral Q4H PRN Crosley, Debby, MD       buPROPion (WELLBUTRIN XL) 24 hr tablet 300 mg  300 mg Oral Daily Crosley, Debby, MD   300 mg at 09/24/22 0826   clonazePAM (KLONOPIN) tablet 0.5 mg  0.5 mg Oral BID Crosley, Debby, MD   0.5 mg at 09/24/22 0827   DULoxetine (CYMBALTA) DR capsule 60 mg  60 mg Oral Daily Crosley, Debby, MD   60 mg at 09/24/22 0827   loratadine (CLARITIN) tablet 10 mg  10 mg Oral Daily Marguerita Merles [place], DO   10 mg at 09/24/22 0827   metoprolol tartrate (LOPRESSOR) tablet 100 mg  100 mg Oral BID Crosley, Debby, MD   100 mg at 09/24/22 0827   pantoprazole (PROTONIX) EC tablet 40 mg  40 mg Oral Daily Crosley, Debby, MD   40 mg at 09/24/22 0827   pregabalin (LYRICA) capsule 50 mg  50 mg Oral Daily Crosley, Debby, MD   50 mg at 09/24/22 0826   senna-docusate (Senokot-S) tablet 1 tablet  1 tablet Oral QHS PRN Gery Pray, MD         Discharge Medications: Please see discharge summary for a list of discharge medications.  Relevant Imaging Results:  Relevant Lab Results:   Additional Information SSN [id number] [place], [place]

## 2022-09-24 NOTE — Progress Notes (Signed)
       RE:   Julie Rice    Date of Birth:  1936/02/04    Date:   09/24/2022     To Whom It May Concern:  Please be advised that the above-named patient will require a short-term nursing home stay - anticipated 30 days or less for rehabilitation and strengthening.  The plan is for return home.

## 2022-09-24 NOTE — Consult Note (Signed)
Cardiology Consultation   Patient ID: Julie Rice MRN: [id number]; DOB: 1936/09/11  Admit date: 09/22/2022 Date of Consult: 09/24/2022  PCP:  Shirline Frees, NP   [facility] Providers Cardiologist:  Charlton Haws, MD  Electrophysiologist:  Will [name], MD  {   Patient Profile:   Julie Rice is a 86 y.o. female with a hx of  fibromyalgia, chronic CHF (systolic with recovered LVEF > diastolic), HTN, AFib, TIA, COPD, desaturations with ambulation on O2 and follows with pulmonary, SVT, RA/OA, urinary incontinence but denies UTIs, large hiatal hernia s/p fundoplication, chronic dysphagia, scoliosis  who is being seen 09/24/2022 for the evaluation of palpitations and wide complex rhythm at the request of Dr. Royann Shivers.   AAD hx Amiodarone started Oct 2020 >> stopped 06/19/21 post ablation    History of Present Illness:   Julie Rice  presented to the hospital 04/03/2021 with sudden onset chest pain and palpitation.  She was found to be in a wide-complex tachycardia with heart rates in the 170s to 180s.  This was unresponsive to Cardizem.  She was cardioverted with restoration of sinus rhythm.  She reverted to wide-complex tachycardia again and was started on amiodarone.  She was taken for EP study and ablation 04/25/2021 which induced a narrow complex tachycardia which terminated with adenosine.  Ablation was performed for ORT at the floor of the coronary sinus.   This year she has had visits with cardiology and EP She saw Dr. Eldridge Dace 02/28/22, c/o SOB and edema, noted dietary indiscretions with frozen dinners.  Using her lasix QOD.  Advised to reduce sodium, her lasix increased, aldactone had been recently added. There was mention of perhaps prior junctional rhythm on a prior EKG, she was not bradycardic and planned for an EKG when she came back in for labs   EKG done by RN and reviewed by DOD, described as having a lot of ectopy and planned for monitoring.    Phone calls with ongoing DOE, planned for labs, including BNP and a CXR to be worked in to see DOD   She saw Dr. Lynnette Caffey 03/08/22, continued BID lasix, started jardiance, planned for the labs/CXR, low suspicion of pneumonia or marked effusion. Pending an already scheduled echo   LVEF 50-55%, no WMA, grade II DD, RVSP 57.35mmHg, mild-mod MR, mod TR Monitor noted SR, avg HR 65, 14 runs SVT longest 2hrs58min at 128 Symptom events associated with SVTs and PVCs   CXR with emphysema, no significant pulmonary congestion BMET ok, BNP 6969   I reviewed her EKGs 02/18/22, baseline artifact though looks like SB 57bpm with a short PR 03/05/22, SB 55bpm, PVCs with 2 morphologies, one couplet 03/08/22, SR 71bpm   I saw her 04/04/22 Her visit in  regards to SVT on her monitor In regards to her heart rhythm, she reported never feeling her tachycardia again since her ablation.  Says she felt awful with that and definitely has not happened again. She has an infrequent sense of a slight flutter now and again only and this is momentary  denied CP, near syncope or syncope. On review of her EKGs, appeared to be a bit of a delta wave, her PR on some were quite short.  I reviewed with Dr. Elberta Fortis and her lopressor dose was increased   She saw Dr. Elberta Fortis 06/07/22, she was following her volume management with general cardiology team, denied palpitations or symptoms of her SVT, no changes were made.   She was admitted 09/22/22 for  c/o CP, an escalation in her usual CP and admitted. HS Trop neg Coronary CT pursued with no more then mild disease Overread did ultimately found to be mod sized hernia  EP is asked on board to evaluate WCT noted on telemetry last evening when getting out pf bed wide complex rhythm associated with CP, not very fast, were symptomatic.  LABS K+ 4.9 Mag 2.4 BUN/Creat 10/0.89 HS Trop 9, 11, 11 WBC 12.5 > 11 . 12.4 H/H 10/32 Plts 354 TSH 1.528  Home lopressor has been increased to  100mg  BID  She reports poor exertional capacity, this not particularly or always associated with palpitations. These palpitations and set of symptoms are somewhat different then pre-ablation. Chest burning mostly  at rest it seems, perhaps with the palpitations an element of CP. Her symptom history is a bit hard to follow. She will sometimes feel weak with the palpitations She has not fainted   Past Medical History:  Diagnosis Date   A-fib (HCC)    Anemia    Anxiety    Bipolar disorder (HCC)    Blood transfusion 1991   autologous pts own blood given    CAD (coronary artery disease)    Cataract    Chest pain    "@ rest, lying down, w/exertion"   CHF (congestive heart failure) (HCC) 07/2020   Chronic back pain    "mostly lower back but I do have upper back pain regularly" (04/06/2018)   COPD (chronic obstructive pulmonary disease) (HCC)    Depression    Esophageal stricture    Fibromyalgia    GERD (gastroesophageal reflux disease)    Heart murmur    "slight" (04/06/2018)   Hiatal hernia    Hyperlipidemia    Patient denies   Hypertension    Internal hemorrhoids    Lumbago    Mitral regurgitation    Osteoarthritis    Osteoporosis    Pneumonia ~ 01/2018   PONV (postoperative nausea and vomiting)    severe ponv, "in the past" (04/06/2018)   Rectal bleeding    Rheumatoid arthritis (HCC)    Spondylosis    TIA (transient ischemic attack) 2013   Urinary incontinence    wears depends    Past Surgical History:  Procedure Laterality Date   ABDOMINAL HYSTERECTOMY  1982   BACK SURGERY     BALLOON DILATION N/A 09/21/2018   Procedure: BALLOON DILATION;  Surgeon: Hilarie Fredrickson, MD;  Location: [facility] ENDOSCOPY;  Service: Endoscopy;  Laterality: N/A;   BALLOON DILATION N/A 08/12/2019   Procedure: BALLOON DILATION;  Surgeon: Lynann Bologna, MD;  Location: [facility] ENDOSCOPY;  Service: Endoscopy;  Laterality: N/A;   BIOPSY  08/12/2019   Procedure: BIOPSY;  Surgeon: Lynann Bologna, MD;  Location: [facility]  ENDOSCOPY;  Service: Endoscopy;;   BLADDER SUSPENSION  1980's   BOTOX INJECTION N/A 09/21/2018   Procedure: BOTOX INJECTION;  Surgeon: Hilarie Fredrickson, MD;  Location: [facility] ENDOSCOPY;  Service: Endoscopy;  Laterality: N/A;   BOTOX INJECTION N/A 08/12/2019   Procedure: BOTOX INJECTION;  Surgeon: Lynann Bologna, MD;  Location: [facility] ENDOSCOPY;  Service: Endoscopy;  Laterality: N/A;   CATARACT EXTRACTION W/ INTRAOCULAR LENS  IMPLANT, BILATERAL Bilateral 2010   DILATION AND CURETTAGE OF UTERUS  1961   ESOPHAGEAL MANOMETRY N/A 03/25/2018   Procedure: ESOPHAGEAL MANOMETRY (EM);  Surgeon: Napoleon Form, MD;  Location: [facility] ENDOSCOPY;  Service: Endoscopy;  Laterality: N/A;   ESOPHAGOGASTRODUODENOSCOPY (EGD) WITH PROPOFOL N/A 04/07/2018   Procedure: ESOPHAGOGASTRODUODENOSCOPY (EGD) WITH PROPOFOL;  Surgeon:  Sherrilyn Rist, MD;  Location: [facility] ENDOSCOPY;  Service: Gastroenterology;  Laterality: N/A;   ESOPHAGOGASTRODUODENOSCOPY (EGD) WITH PROPOFOL N/A 09/21/2018   Procedure: ESOPHAGOGASTRODUODENOSCOPY (EGD) WITH PROPOFOL;  Surgeon: Hilarie Fredrickson, MD;  Location: [facility] ENDOSCOPY;  Service: Endoscopy;  Laterality: N/A;   ESOPHAGOGASTRODUODENOSCOPY (EGD) WITH PROPOFOL N/A 08/12/2019   Procedure: ESOPHAGOGASTRODUODENOSCOPY (EGD) WITH PROPOFOL;  Surgeon: Lynann Bologna, MD;  Location: [facility] ENDOSCOPY;  Service: Endoscopy;  Laterality: N/A;   HERNIA REPAIR     HIATAL HERNIA REPAIR N/A 08/02/2016   Procedure: LAPAROSCOPIC REPAIR OF LARGE  HIATAL HERNIA;  Surgeon: Glenna Fellows, MD;  Location: [facility] ORS;  Service: General;  Laterality: N/A;   JOINT REPLACEMENT     LAPAROSCOPIC NISSEN FUNDOPLICATION N/A 08/02/2016   Procedure: LAPAROSCOPIC NISSEN FUNDOPLICATION;  Surgeon: Glenna Fellows, MD;  Location: [facility] ORS;  Service: General;  Laterality: N/A;   LUMBAR LAMINECTOMY  1990; 1994; [id number];7062   "I've got 2 stainless steel rods; 6 screws; 2 ray cages"took bone from right hip to put in back   RIGHT/LEFT HEART CATH AND CORONARY  ANGIOGRAPHY N/A 07/26/2019   Procedure: RIGHT/LEFT HEART CATH AND CORONARY ANGIOGRAPHY;  Surgeon: Lyn Records, MD;  Location: [facility] INVASIVE CV LAB;  Service: Cardiovascular;  Laterality: N/A;   SVT ABLATION N/A 04/25/2021   Procedure: SVT ABLATION;  Surgeon: Regan Lemming, MD;  Location: [facility] INVASIVE CV LAB;  Service: Cardiovascular;  Laterality: N/A;   TEE WITHOUT CARDIOVERSION N/A 08/19/2019   Procedure: TRANSESOPHAGEAL ECHOCARDIOGRAM (TEE);  Surgeon: Lewayne Bunting, MD;  Location: [facility] ENDOSCOPY;  Service: Cardiovascular;  Laterality: N/A;   TONSILLECTOMY AND ADENOIDECTOMY  1945   TOTAL KNEE ARTHROPLASTY Right ~ 1996   TUBAL LIGATION  ~ 1976     Home Medications:  Prior to Admission medications   Medication Sig Start Date End Date Taking? Authorizing Provider  acetaminophen (TYLENOL) 500 MG tablet Take 1,000 mg by mouth every 6 (six) hours as needed for moderate pain, headache or fever.   Yes [provider]  albuterol (VENTOLIN HFA) 108 (90 Base) MCG/ACT inhaler TAKE 2 PUFFS BY MOUTH EVERY 6 HOURS AS NEEDED FOR WHEEZE OR SHORTNESS OF BREATH Patient taking differently: Inhale 2 puffs into the lungs every 6 (six) hours as needed for wheezing or shortness of breath. 08/06/22  Yes Nafziger, [name], NP  buPROPion (WELLBUTRIN XL) 300 MG 24 hr tablet TAKE 1 TABLET BY MOUTH EVERY DAY 08/06/22  Yes Nafziger, [name], NP  Carboxymethylcellulose Sodium (REFRESH TEARS OP) Place 1 drop into both eyes 2 (two) times daily.   Yes [provider]  cetirizine (ZYRTEC) 10 MG tablet Take 10 mg by mouth daily as needed for allergies. 02/20/21  Yes [provider]  clonazePAM (KLONOPIN) 0.5 MG tablet Take 0.5 tablets (0.25 mg total) by mouth 2 (two) times daily as needed for anxiety. 09/11/22 10/11/22 Yes Nafziger, [name], NP  DULoxetine (CYMBALTA) 60 MG capsule TAKE 1 CAPSULE BY MOUTH EVERY DAY Patient taking differently: Take 60 mg by mouth daily. 11/30/21  Yes Lovorn, Megan, MD  ELIQUIS 5  MG TABS tablet TAKE 1 TABLET BY MOUTH TWICE A DAY Patient taking differently: Take 5 mg by mouth 2 (two) times daily. 05/08/22  Yes Camnitz, Will Daphine Deutscher, MD  FeFum-FePoly-FA-B Cmp-C-Biot (INTEGRA PLUS) CAPS Take 1 capsule by mouth every morning. 08/30/22  Yes Heilingoetter, Cassandra L, PA-C  KLOR-CON M20 20 MEQ tablet TAKE 1 TABLET BY MOUTH EVERY DAY 08/06/22  Yes Weaver, Scott T, PA-C  losartan (COZAAR) 50 MG tablet  TAKE 1 TABLET BY MOUTH EVERY DAY 07/18/22  Yes Camnitz, Andree Coss, MD  metoprolol tartrate (LOPRESSOR) 50 MG tablet Take 1.5 tablets (75 mg total) by mouth 2 (two) times daily. 07/18/22  Yes Camnitz, Will Daphine Deutscher, MD  Multiple Vitamins-Minerals (ONE-A-DAY WOMENS 50+) TABS Take 1 tablet by mouth daily.   Yes [provider]  omeprazole (PRILOSEC) 20 MG capsule TAKE 1 CAPSULE BY MOUTH EVERY DAY Patient taking differently: Take 20 mg by mouth daily. 08/28/22  Yes Nafziger, [name], NP  pregabalin (LYRICA) 50 MG capsule TAKE 1 CAPSULE BY MOUTH EVERY DAY Patient taking differently: Take 50 mg by mouth daily. 09/05/22  Yes Nafziger, [name], NP  sodium chloride (OCEAN) 0.65 % SOLN nasal spray Place 1 spray into both nostrils as needed for congestion.   Yes [provider]  spironolactone (ALDACTONE) 25 MG tablet Take 25 mg by mouth daily. 05/26/22  Yes [provider]    Inpatient Medications: Scheduled Meds:  apixaban  5 mg Oral BID   buPROPion  300 mg Oral Daily   clonazePAM  0.5 mg Oral BID   DULoxetine  60 mg Oral Daily   loratadine  10 mg Oral Daily   metoprolol tartrate  100 mg Oral BID   pantoprazole  40 mg Oral Daily   pregabalin  50 mg Oral Daily   Continuous Infusions:  PRN Meds: acetaminophen **OR** acetaminophen, albuterol, bismuth subsalicylate, senna-docusate  Allergies:    Allergies  Allergen Reactions   Tape Other (See Comments)    Band aides, adhesive tape Redness and pulls skin off   Tramadol Other (See Comments)    Pt has prolonged Qtc  interval of 540- cannot give tramadol per pharmacy   Morphine Itching and Rash    Flushing    Social History:   Social History   Socioeconomic History   Marital status: Widowed    Spouse name: Not on file   Number of children: 2   Years of education: 54   Highest education level: 12th grade  Occupational History   Occupation: retired    Associate Professor: RETIRED  Tobacco Use   Smoking status: Never   Smokeless tobacco: Never  Vaping Use   Vaping Use: Never used  Substance and Sexual Activity   Alcohol use: Never   Drug use: Never   Sexual activity: Not Currently    Comment: 1st intercourse 25 yo-1 partner  Other Topics Concern   Not on file  Social History Narrative   Retired    Widowed   Right handed   Social Determinants of Health   Financial Resource Strain: Low Risk  (02/14/2022)   Overall Financial Resource Strain (CARDIA)    Difficulty of Paying Living Expenses: Not hard at all  Food Insecurity: No Food Insecurity (09/23/2022)   Hunger Vital Sign    Worried About Running Out of Food in the Last Year: Never true    Ran Out of Food in the Last Year: Never true  Transportation Needs: No Transportation Needs (09/23/2022)   PRAPARE - Administrator, Civil Service (Medical): No    Lack of Transportation (Non-Medical): No  Physical Activity: Inactive (02/14/2022)   Exercise Vital Sign    Days of Exercise per Week: 0 days    Minutes of Exercise per Session: 0 min  Stress: No Stress Concern Present (02/14/2022)   [employer] of Occupational Health - Occupational Stress Questionnaire    Feeling of Stress : Not at all  Social Connections: Moderately Integrated (  02/14/2022)   Social Connection and Isolation Panel [NHANES]    Frequency of Communication with Friends and Family: More than three times a week    Frequency of Social Gatherings with Friends and Family: More than three times a week    Attends Religious Services: More than 4 times per year    Active  Member of [employer] or Organizations: Yes    Attends Banker Meetings: More than 4 times per year    Marital Status: Widowed  Intimate Partner Violence: Not At Risk (09/23/2022)   Humiliation, Afraid, Rape, and Kick questionnaire    Fear of Current or Ex-Partner: No    Emotionally Abused: No    Physically Abused: No    Sexually Abused: No    Family History:   Family History  Problem Relation Age of Onset   Kidney disease Mother    Hypertension Mother    Heart disease Father 60       die of MI at age 99   Heart disease Brother    Heart attack Brother    Suicidality Son    Other Son        MVA   Colon cancer Neg Hx    Esophageal cancer Neg Hx    Pancreatic cancer Neg Hx    Rectal cancer Neg Hx    Stomach cancer Neg Hx      ROS:  Please see the history of present illness.  All other ROS reviewed and negative.     Physical Exam/Data:   Vitals:   09/24/22 0052 09/24/22 0446 09/24/22 0813 09/24/22 1117  BP: (!) 145/62 (!) 128/53 (!) 114/50 111/64  Pulse:  63 83 89  Resp: Temp: (!) 97.4 F (36.3 C) (!) 97.5 F (36.4 C) 98 F (36.7 C) 97.9 F (36.6 C)  TempSrc: Oral Oral Oral Oral  SpO2: 96% 100% 98% 96%  Weight: 65.5 kg     Height:        Intake/Output Summary (Last 24 hours) at 09/24/2022 1255 Last data filed at 09/24/2022 1100 Gross per 24 hour  Intake 720 ml  Output 600 ml  Net 120 ml      09/24/2022   12:52 AM 09/23/2022    4:34 PM 09/22/2022    5:22 PM  Last 3 Weights  Weight (lbs) 144 lb 8 oz 145 lb 4.5 oz 150 lb  Weight (kg) 65.545 kg 65.9 kg 68.04 kg     Body mass index is 26.43 kg/m.  General:  Well nourished, well developed, in no acute distress, appears her age HEENT: normal Neck: no JVD Vascular: No carotid bruits Cardiac:  RRR; no murmurs, gallops or rubs Lungs:  CTA b/l, no wheezing, rhonchi or rales  Abd: soft, nontender, no hepatomegaly  Ext: no edema Musculoskeletal:  No deformities Skin: warm and dry   Neuro:  no focal abnormalities noted Psych:  Normal affect   EKG:  The EKG was personally reviewed and demonstrates:   SR 101bpm, ? PVCs vs PACs w/aberrancy    Telemetry:  Telemetry was personally reviewed and demonstrates:   SR, PACs w/BBB noted last evening with mor persistent WC rhythm low 100s  Relevant CV Studies:  03/20/22: monitor Patch wear time was 6 days and 23 hours Predominant rhythm was NSR with average HR 65bpm 14 runs of SVT with longest lasting 2 hours and at HR 128bpm Occasional SVE (1.1%), rare VE (<1%) Patient triggered events correlate with SVT and PVCs  No Afib or significant pauses seen   03/15/22: TTE  1. Left ventricular ejection fraction, by estimation, is 50 to 55%. The  left ventricle has low normal function. The left ventricle has no regional  wall motion abnormalities. There is mild left ventricular hypertrophy.  Left ventricular diastolic  parameters are consistent with Grade II diastolic dysfunction  (pseudonormalization). Elevated left atrial pressure.   2. Right ventricular systolic function is normal. The right ventricular  size is mildly enlarged. There is moderately elevated pulmonary artery  systolic pressure. The estimated right ventricular systolic pressure is  57.5 mmHg.   3. Left atrial size was severely dilated.   4. Right atrial size was mildly dilated.   5. The mitral valve is degenerative. Mild to moderate mitral valve  regurgitation. No evidence of mitral stenosis. Moderate mitral annular  calcification.   6. Tricuspid valve regurgitation is moderate.   7. The aortic valve is tricuspid. Aortic valve regurgitation is mild.  Aortic valve sclerosis is present, with no evidence of aortic valve  stenosis.   8. The inferior vena cava is normal in size with greater than 50%  respiratory variability, suggesting right atrial pressure of 3 mmHg.    04/25/21: EPS/ablation CONCLUSIONS:  1. Sinus rhythm upon presentation.  2. The  patient had orthodromic reciprocating tachycardia 3. Successful radiofrequency ablation of an accessory pathway at the floor of the coronary sinus 4. No inducible arrhythmias following ablation.  5. No early apparent complications.       TTE 04/04/21   1. Left ventricular ejection fraction, by estimation, is 60 to 65%. The  left ventricle has normal function. Left ventricular endocardial border  not optimally defined to evaluate regional wall motion. There is mild left  ventricular hypertrophy. Left  ventricular diastolic parameters are consistent with Grade I diastolic  dysfunction (impaired relaxation).   2. Right ventricular systolic function is normal. The right ventricular  size is normal. There is normal pulmonary artery systolic pressure. The  estimated right ventricular systolic pressure is 29.0 mmHg.   3. The mitral valve is degenerative. Trivial mitral valve regurgitation.  No evidence of mitral stenosis. Moderate mitral annular calcification.   4. The aortic valve is normal in structure. There is mild calcification  of the aortic valve. Aortic valve regurgitation is mild. No aortic  stenosis is present.   5. The inferior vena cava is normal in size with greater than 50%  respiratory variability, suggesting right atrial pressure of 3 mmHg.   Right and left heart cath on 07/26/2019 showed only mild nonobstructive disease and low right heart pressures    Laboratory Data:  High Sensitivity Troponin:   Recent Labs  Lab 09/22/22 1705 09/22/22 2000 09/23/22 0201  TROPONINIHS 9 11 11      Chemistry Recent Labs  Lab 09/22/22 1705 09/22/22 1719 09/23/22 0201 09/24/22 0025  NA 138 137 137 137  K 3.5 3.5 4.3 4.9  CL 101  --  100 102  CO2 26  --  26 27  GLUCOSE 102*  --  95 150*  BUN 12  --  13 10  CREATININE 0.89  --  0.94 0.89  CALCIUM 8.9  --  8.7* 9.1  MG  --   --  2.3 2.4  GFRNONAA >60  --  60* >60  ANIONGAP 11  --  11 8    Recent Labs  Lab 09/22/22 1705  09/24/22 0025  PROT 5.8* 5.8*  ALBUMIN 3.1* 3.3*  AST 17 16  ALT 15  14  ALKPHOS 64 58  BILITOT 0.6 0.3   Lipids No results for input(s): "CHOL", "TRIG", "HDL", "LABVLDL", "LDLCALC", "CHOLHDL" in the last 168 hours.  Hematology Recent Labs  Lab 09/22/22 1705 09/22/22 1719 09/23/22 0940 09/24/22 0025  WBC 12.5*  --  11.0* 12.4*  RBC 3.28*  --  3.39* 3.42*  HGB 9.8* 10.2* 10.1* 10.0*  HCT 30.5* 30.0* 31.5* 32.2*  MCV 93.0  --  92.9 94.2  MCH 29.9  --  29.8 29.2  MCHC 32.1  --  32.1 31.1  RDW 15.0  --  15.3 15.1  PLT 339  --  352 354   Thyroid  Recent Labs  Lab 09/24/22 0025  TSH 1.528    BNP Recent Labs  Lab 09/22/22 1730  BNP 438.0*    DDimer  Recent Labs  Lab 09/22/22 1705  DDIMER <0.27     Radiology/Studies:  CT CORONARY MORPH W/CTA COR W/SCORE W/CA W/CM &/OR WO/CM Addendum Date: 09/23/2022   ADDENDUM REPORT: 09/23/2022 15:40 CLINICAL DATA:  Chest pain EXAM: Cardiac CTA MEDICATIONS: Sub lingual nitro.  x 2 TECHNIQUE: The patient was scanned on a Siemens 192 slice scanner. Gantry rotation speed was 250 msecs. Collimation was 0.6 mm. A 100 kV prospective scan was triggered in the ascending thoracic aorta at 35-75% of the R-R interval. Average HR during the scan was 60 bpm. The 3D data set was interpreted on a dedicated work station using MPR, MIP and VRT modes. A total of 80cc of contrast was used. FINDINGS: Non-cardiac: See separate report from [facility]. Pulmonary veins drain normally to the left atrium. No LA appendage thrombus. Calcium Score: 457 Agatston units. Coronary Arteries: Right dominant with no anomalies LM: Calcified plaque distal left main, mild (1-24%) stenosis. LAD system: Ostial calcified plaque, mild (1-24%) stenosis. Mixed plaque proximal LAD, mild (25-49%) stenosis. Circumflex system: No plaque or stenosis. RCA system: Difficult to assess due to artifact. By using multiple phases, suspect only mild (1-24%) stenosis with calcified  plaque in proximal and mid RCA. IMPRESSION: 1. Coronary calcium score 457 Agatston units. This places the patient in the 73rd percentile for age and gender, suggesting intermediate risk for future cardiac events. 2. Very difficult images due to artifact, but suspect no more than mild stenosis in the coronary tree. Dalton Mclean Electronically Signed   By: Marca Ancona M.D.   On: 09/23/2022 15:40   Result Date: 09/23/2022 EXAM: OVER-READ INTERPRETATION  CT CHEST The following report is a limited chest CT over-read performed by radiologist Dr. Allegra Lai of [facility] on 09/23/2022. This over-read does not include interpretation of cardiac or coronary anatomy or pathology. The coronary CTA interpretation by the cardiologist is attached. COMPARISON:  None Available. FINDINGS: Vascular: Cardiomegaly. No pericardial effusion. Normal caliber thoracic aorta with moderate atherosclerotic disease. Mediastinum/Nodes: Moderate hiatal hernia. Patulous and fluid-filled esophagus. No pathologically enlarged lymph nodes seen in the chest. Lungs/Pleura: Lungs are clear. No pleural effusion or pneumothorax. Upper Abdomen: Partially visualized lucency under the right hemidiaphragm, concerning for free air. Musculoskeletal: No chest wall mass or suspicious bone lesions identified. IMPRESSION: 1. Air under the right hemidiaphragm which could be due to pneumoperitoneum or partially visualized bowel loop. Recommend further evaluation with CT or abdominal radiograph depending on level concern clinical concern. 2. Aortic Atherosclerosis (ICD10-I70.0). 3. Moderate hiatal hernia. 4. Patulous and fluid-filled esophagus, findings can be seen in the setting of esophageal dysmotility. Critical Value/emergent results were called by telephone at the time of interpretation on 09/23/2022  at 1:23 pm to provider [facility], who verbally acknowledged these results. Electronically Signed: By: Allegra Lai M.D. On: 09/23/2022  13:27   CT ABDOMEN PELVIS WO CONTRAST Result Date: 09/23/2022 CLINICAL DATA:  Acute generalized abdominal pain. EXAM: CT ABDOMEN AND PELVIS WITHOUT CONTRAST TECHNIQUE: Multidetector CT imaging of the abdomen and pelvis was performed following the standard protocol without IV contrast. RADIATION DOSE REDUCTION: This exam was performed according to the departmental dose-optimization program which includes automated exposure control, adjustment of the mA and/or kV according to patient size and/or use of iterative reconstruction technique. COMPARISON:  CT of today.  CT of November 22, 2021. FINDINGS: Lower chest: No acute abnormality. Hepatobiliary: No focal liver abnormality is seen. No gallstones, gallbladder wall thickening, or biliary dilatation. Pancreas: Unremarkable. No pancreatic ductal dilatation or surrounding inflammatory changes. Spleen: Normal in size without focal abnormality. Adrenals/Urinary Tract: Adrenal glands are unremarkable. Kidneys are normal, without renal calculi, focal lesion, or hydronephrosis. Bladder is unremarkable. Stomach/Bowel: Moderate size sliding-type hiatal hernia is noted. There is no evidence of bowel obstruction or inflammation. Vascular/Lymphatic: Aortic atherosclerosis. No enlarged abdominal or pelvic lymph nodes. Reproductive: Status post hysterectomy. No adnexal masses. Other: No abdominal wall hernia or abnormality. No abdominopelvic ascites. No pneumoperitoneum is noted. The abnormality in question on prior exam represented bowel. Musculoskeletal: Status post surgical posterior fusion extending from L2-S1. No acute osseous abnormality is noted. IMPRESSION: Moderate size sliding-type hiatal hernia. There is no evidence of pneumoperitoneum. No acute abnormality seen in the abdomen or pelvis. Aortic Atherosclerosis (ICD10-I70.0). Electronically Signed   By: Lupita Raider M.D.   On: 09/23/2022 15:02   DG Chest Port 1 View  Result Date: 09/22/2022 CLINICAL DATA:  Chest  pain, difficulty breathing. EXAM: PORTABLE CHEST 1 VIEW COMPARISON:  None Available. FINDINGS: Heart is enlarged, unchanged. Both lungs are clear. The visualized skeletal structures are unremarkable. IMPRESSION: No active disease. Cardiomegaly. Electronically Signed   By: Darliss Cheney M.D.   On: 09/22/2022 17:44     Assessment and Plan:   SVT S/p ablation of an accessory pathway (ORT) at the floor of the coronary sinus Telemetry reviewed with Dr. Nelly Laurence, suspect aberrancy for most  She does have very frequent PACs    Paroxysmal Afib CHA2DS2Vasc is 7, on Eliquis, appropriately dosed None noted on telemetry  Resume amiodarone at  QD EP follow up with Dr. Elberta Fortis will be arranged      Risk Assessment/Risk Scores:    For questions or updates, please contact [facility] Please consult [email or web address] for contact info under    Signed, Sheilah Pigeon, PA-C  09/24/2022 12:55 PM

## 2022-09-24 NOTE — Progress Notes (Signed)
PROGRESS NOTE    Julie Rice  ZOX:[id number] DOB: 01/29/1936 DOA: 09/22/2022 PCP: Shirline Frees, NP   Brief Narrative:  The patient is an 86 year old Caucasian female with a past medical history significant for but not to hypertension, rheumatoid arthritis, chronic diastolic CHF, COPD, chronic respiratory failure on 2-1/2 L of oxygen nightly, esophageal strictures as well as GERD and depression and anxiety as well as other comorbidities who presented to the emergency room with chest burning and palpitations.  Her main complaint was that when she walks she develops chest tightness and a burning sensation that goes across her chest and down both her arms to her elbows and discharged about 10 days ago and has subsequently been getting worse.  She does not complain of shortness of breath more of chest tightness and discomfort as well as palpitations.  She denies any worsening lower extremity edema and denies any increase in oxygen needs.  Yesterday after walking a few steps she had severe chest burning and palpitations that she could not stand straight so she decided to come to the ER for further evaluation.  In the ED her cardiac enzymes were negative x3 and she is noted to be tachycardic with a lot of PVCs with a Baron C.  There is no A-fib noted.  The EDP attempted to make her walk and she became extremely tachycardic and dyspneic per the ER physician so the hospitalist was consulted for further evaluation and admission.  Given her persistent symptoms cardiology has been consulted and they are recommending obtaining a coronary CT scan today if possible for her chest pain and if this is negative they are recommending doing a plain treadmill ECG test not looking for ischemia but to see if there is indeed exercise related tachyarrhythmia.  Cardiology feels that ultimately that her chest discomfort is related to her worsening pulmonary arterial hypertension with no clear etiology and that she may require a  right heart cath.   Patient underwent a coronary CT scan which showed widespread plaque without obstructive lesions by coronary CT angiography and no coronary artery stenosis noted.  She had an episode last night and telemetry showed frequent PACs and other atrial arrhythmia with conducted wide-complex QRS.  Cardiology recommended EP evaluation and feel that she may have A-fib with about aberrancy.  They do not feel that she will need a stress treadmill test.  They feel that her wide be related to recurrent conduction to ablated accessory pathway or previously undiagnosed secondary accessory pathway.  They are recommending continuing anticoagulation and EP evaluation and EP evaluated and feels that she is in sinus tach with frequent PACs or runs of atrial tachycardia.  The AV RT introduced in the lab during EP study was a narrow complex arrhythmia and the EP physicians feel that her primary chest burning complaint is related to GI disease but cardiology feels that it is reasonable to give her amiodarone to calm her ectopy down.  Assessment and Plan:  Chest discomfort with palpitations and SVT on recent monitoring PAF with possible aberrancy -She has been admitted to medical observation to the telemetry unit -Became symptomatic when the EDP attempted to make her walk -Given her symptoms cardiology has been consulted and recommending a coronary CT angiography to evaluate for progression of known left moderate coronary artery stenosis but this was done and cardiology recommends not doing the plain exercise treadmill test and having EP evaluate -Cardiology feels that this may be related to her worsening pulmonary arterial hypertension with activity  but the cause of her pulm hypertension is not entirely clear -Troponin values have ranged from 9 -> 11 -> 11 -She has had a significant cardiac history and in the ED she had multiple PVCs and irregular rhythms noted on telemetry with no A-fib and no sustained  SVTs -She has had cardioversion 3 times as well as EPS evaluation for PAF as well as an ablation -Patient states that her current symptoms are similar to what happened in the past when she was in paroxysmal atrial fibrillation -Her metoprolol was increased to 100 mg p.o. twice daily and her losartan has been held however cardiology is planning coronary CTA as above -Continue monitor and trend electrolytes -She had nonobstructive coronary artery disease by heart cath in 2020 -Given her history of PAF she will be continued on apixaban -Coronary CT scan done and showed "Coronary calcium score 457 Agatston units. This places the patient in the 73rd percentile for age and gender, suggesting intermediate risk for future cardiac events. Very difficult images due to artifact, but suspect no more than mild stenosis in the coronary tree" -She had an episode last night and telemetry showed frequent PACs and other atrial arrhythmia with conducted wide-complex QRS.  Cardiology recommended EP evaluation and feel that she may have A-fib with about aberrancy.  They do not feel that she will need a stress treadmill test.  They feel that her wide be related to recurrent conduction to ablated accessory pathway or previously undiagnosed secondary accessory pathway.   -They are recommending continuing anticoagulation and EP evaluation and EP evaluated and feels that she is in sinus tach with frequent PACs or runs of atrial tachycardia.  The AVRT induced in the lab during EP study was a narrow complex arrhythmia and the EP physicians feel that her primary chest burning complaint is related to GI disease but cardiology feels that it is reasonable to give her amiodarone to calm her ectopy down. -PT OT recommending SNF and TOC assisting with discharge disposition   Chronic diastolic CHF and heart failure with preserved with ejection fraction -Does not appear to be overtly volume overloaded -BNP was elevated at  438.0 -Continue to monitor for signs and symptoms of volume overload -Repeat chest x-ray in the a.m. -Given a dose of IV Lasix x1 yesterday -Currently her losartan and her spironolactone has been held but her metoprolol has been increased to 100 mg p.o. twice daily -Appreciate cardiology evaluation and recommendations and defer to them to resume her medications   Paroxysmal Atrial Fibrillation -She has a very high CHA2DS2-VASc of 8 -Continue with anticoagulation and telemetry monitoring -Currently getting metoprolol tartrate 100 mg p.o. twice daily now   Acquired Thrombophilia -As above due to A Fib  -C/w Anticoagulation   Essential Hypertension -Holding spironolactone and losartan for now -Continue metoprolol 100 mL p.o. twice daily -Continue to monitor blood pressures per protocol -Last blood pressure reading was 114/77   Major Depressive Disorder Generalized Anxiety Disorder -Continue with bupropion 200 g p.o. daily as well as clonazepam 0.5 mg p.o. twice daily as well as duloxetine 60 mg p.o. daily   Chronic respiratory failure with hypoxia as well as a history of COPD -SpO2: 97 %; currently not wearing supplemental oxygen as she only wears it nightly -Continuous pulse oximetry maintain O2 saturations greater than 90% -Continue to monitor respiratory status carefully-resume albuterol inhaler if necessary as well as cetirizine for allergies   Chronic Pain Syndrome -Continue with pregabalin 50 mg p.o. daily and with acetaminophen 660  mg p.o./RC every 6 as needed for mild pain   Rheumatoid Arthritis involving multiple sites as well as Idiopathic Peripheral Neuropathy -Continue with pregabalin 50 mg p.o. daily   Normocytic Anemia -Patient's hemoglobin/hematocrit went from 9.8/30.5 -> 10.2/30.0 -> 10.1/31.5 and is now 10.0/32.2 -Check anemia panel in the a.m. -Continue to monitor for signs and symptoms of bleeding; no overt bleeding noted -Repeat CBC in a.m.   Leukocytosis,  slightly worsening -Likely reactive in the setting of above -Patient's WBC went from 12.5 is now 11.0 yesterday and is now back to 12.4 -Continue monitor and trend and repeat CMP in a.m.   GERD/GI Prophylaxis -Continue with pantoprazole 40 mg p.o. daily  Questionable air under the diaphragm -CTA done and showed "Air under the right hemidiaphragm which could be due to pneumoperitoneum or partially visualized bowel loop. Recommend further evaluation with CT or abdominal radiograph depending on level concern clinical concern. 2. Aortic Atherosclerosis (ICD10-I70.0). 3. Moderate hiatal hernia. 4. Patulous and fluid-filled esophagus, findings can be seen in the setting of esophageal dysmotility." -CT Abd/Pelvis done and showed "Moderate size sliding-type hiatal hernia. There is no evidence of pneumoperitoneum. No acute abnormality seen in the abdomen or pelvis. Aortic Atherosclerosis"  Esophageal Dysmotility -Has had Stretching in the Past -Will get a Barium swallow and possibly GI Evaluation -C/w PPI but change to BID  DVT prophylaxis: SCDs Start: 09/22/22 2210 apixaban (ELIQUIS) tablet 5 mg    Code Status: Full Code Family Communication: No family at bedside  Disposition Plan:  Level of care: Telemetry Medical Status is: Observation The patient will require care spanning > 2 midnights and should be moved to inpatient because: Needs further cardiac clearance prior to safe discharge disposition   Consultants:  Cardiology EP cardiology  Procedures:  Coronary CT scan  Antimicrobials:  Anti-infectives (From admission, onward)    None       Subjective: Seen and examined at bedside and was having some "soreness on her voice" and has a history of stricture that had to be dilated previously.  Had an episode last night where she became tachycardic.  No nausea or vomiting.  Feels okay.  Just feels tired.  No other concerns or points at this time.  Objective: Vitals:   09/24/22 0052  09/24/22 0446 09/24/22 0813 09/24/22 1117  BP: (!) 145/62 (!) 128/53 (!) 114/50 111/64  Pulse:  63 83 89  Resp: 18 18 18 16   Temp: (!) 97.4 F (36.3 C) (!) 97.5 F (36.4 C) 98 F (36.7 C) 97.9 F (36.6 C)  TempSrc: Oral Oral Oral Oral  SpO2: 96% 100% 98% 96%  Weight: 65.5 kg     Height:        Intake/Output Summary (Last 24 hours) at 09/24/2022 1219 Last data filed at 09/24/2022 1100 Gross per 24 hour  Intake 720 ml  Output 600 ml  Net 120 ml   Filed Weights   09/22/22 1722 09/23/22 1634 09/24/22 0052  Weight: 68 kg 65.9 kg 65.5 kg   Examination: Physical Exam:  Constitutional: WN/WD overweight Caucasian elderly female in no acute distress sitting in the chair Respiratory: Diminished to auscultation bilaterally, no wheezing, rales, rhonchi or crackles. Normal respiratory effort and patient is not tachypenic. No accessory muscle use.  Unlabored breathing Cardiovascular: RRR, no murmurs / rubs / gallops. S1 and S2 auscultated. No extremity edema.  Abdomen: Soft, non-tender, slightly distended secondary body habitus. Bowel sounds positive.  GU: Deferred. Musculoskeletal: No clubbing / cyanosis of digits/nails. No joint deformity upper  and lower extremities.  Skin: No rashes, lesions, ulcers on limited skin evaluation. No induration; Warm and dry.  Neurologic: CN 2-12 grossly intact with no focal deficits. Romberg sign and cerebellar reflexes not assessed.  Psychiatric: Normal judgment and insight. Alert and oriented x 3. Normal mood and appropriate affect.   Data Reviewed: I have personally reviewed following labs and imaging studies  CBC: Recent Labs  Lab 09/22/22 1705 09/22/22 1719 09/23/22 0940 09/24/22 0025  WBC 12.5*  --  11.0* 12.4*  NEUTROABS 10.2*  --  8.7* 9.7*  HGB 9.8* 10.2* 10.1* 10.0*  HCT 30.5* 30.0* 31.5* 32.2*  MCV 93.0  --  92.9 94.2  PLT 339  --  352 354   Basic Metabolic Panel: Recent Labs  Lab 09/22/22 1705 09/22/22 1719 09/23/22 0201  09/23/22 0940 09/24/22 0025  NA 138 137 137  --  137  K 3.5 3.5 4.3  --  4.9  CL 101  --  100  --  102  CO2 26  --  26  --  27  GLUCOSE 102*  --  95  --  150*  BUN 12  --  13  --  10  CREATININE 0.89  --  0.94  --  0.89  CALCIUM 8.9  --  8.7*  --  9.1  MG  --   --  2.3  --  2.4  PHOS  --   --   --  2.9 3.4   GFR: Estimated Creatinine Clearance: 41.8 mL/min (by C-G formula based on SCr of 0.89 mg/dL). Liver Function Tests: Recent Labs  Lab 09/22/22 1705 09/24/22 0025  AST 17 16  ALT 15 14  ALKPHOS 64 58  BILITOT 0.6 0.3  PROT 5.8* 5.8*  ALBUMIN 3.1* 3.3*   No results for input(s): "LIPASE", "AMYLASE" in the last 168 hours. No results for input(s): "AMMONIA" in the last 168 hours. Coagulation Profile: No results for input(s): "INR", "PROTIME" in the last 168 hours. Cardiac Enzymes: No results for input(s): "CKTOTAL", "CKMB", "CKMBINDEX", "TROPONINI" in the last 168 hours. BNP (last 3 results) Recent Labs    03/08/22 1226 05/14/22 1432 05/24/22 1308  PROBNP 6,969* 2,909* 2,900*   HbA1C: No results for input(s): "HGBA1C" in the last 72 hours. CBG: No results for input(s): "GLUCAP" in the last 168 hours. Lipid Profile: No results for input(s): "CHOL", "HDL", "LDLCALC", "TRIG", "CHOLHDL", "LDLDIRECT" in the last 72 hours. Thyroid Function Tests: Recent Labs    09/24/22 0025  TSH 1.528   Anemia Panel: No results for input(s): "VITAMINB12", "FOLATE", "FERRITIN", "TIBC", "IRON", "RETICCTPCT" in the last 72 hours. Sepsis Labs: No results for input(s): "PROCALCITON", "LATICACIDVEN" in the last 168 hours.  Recent Results (from the past 240 hour(s))  Resp Panel by RT-PCR (Flu A&B, Covid) Anterior Nasal Swab     Status: None   Collection Time: 09/22/22  5:28 PM   Specimen: Anterior Nasal Swab  Result Value Ref Range Status   SARS Coronavirus 2 by RT PCR NEGATIVE NEGATIVE Final    Comment: (NOTE) SARS-CoV-2 target nucleic acids are NOT DETECTED.  The SARS-CoV-2  RNA is generally detectable in upper respiratory specimens during the acute phase of infection. The lowest concentration of SARS-CoV-2 viral copies this assay can detect is 138 copies/mL. A negative result does not preclude SARS-Cov-2 infection and should not be used as the sole basis for treatment or other patient management decisions. A negative result may occur with  improper specimen collection/handling, submission of specimen other than  nasopharyngeal swab, presence of viral mutation(s) within the areas targeted by this assay, and inadequate number of viral copies(<138 copies/mL). A negative result must be combined with clinical observations, patient history, and epidemiological information. The expected result is Negative.  Fact Sheet for Patients:  BloggerCourse.com  Fact Sheet for Healthcare Providers:  SeriousBroker.it  This test is no t yet approved or cleared by the [place] FDA and  has been authorized for detection and/or diagnosis of SARS-CoV-2 by FDA under an Emergency Use Authorization (EUA). This EUA will remain  in effect (meaning this test can be used) for the duration of the COVID-19 declaration under Section 564(b)(1) of the Act, 21 U.S.C.section 360bbb-3(b)(1), unless the authorization is terminated  or revoked sooner.       Influenza A by PCR NEGATIVE NEGATIVE Final   Influenza B by PCR NEGATIVE NEGATIVE Final    Comment: (NOTE) The Xpert Xpress SARS-CoV-2/FLU/RSV plus assay is intended as an aid in the diagnosis of influenza from Nasopharyngeal swab specimens and should not be used as a sole basis for treatment. Nasal washings and aspirates are unacceptable for Xpert Xpress SARS-CoV-2/FLU/RSV testing.  Fact Sheet for Patients: BloggerCourse.com  Fact Sheet for Healthcare Providers: SeriousBroker.it  This test is not yet approved or cleared by the  [place] FDA and has been authorized for detection and/or diagnosis of SARS-CoV-2 by FDA under an Emergency Use Authorization (EUA). This EUA will remain in effect (meaning this test can be used) for the duration of the COVID-19 declaration under Section 564(b)(1) of the Act, 21 U.S.C. section 360bbb-3(b)(1), unless the authorization is terminated or revoked.  Performed at [facility], [address] [place], [place] [address]     Radiology Studies: CT CORONARY MORPH W/CTA COR W/SCORE W/CA W/CM &/OR WO/CM  Addendum Date: 09/23/2022   ADDENDUM REPORT: 09/23/2022 15:40 CLINICAL DATA:  Chest pain EXAM: Cardiac CTA MEDICATIONS: Sub lingual nitro. 4mg  x 2 TECHNIQUE: The patient was scanned on a Siemens 192 slice scanner. Gantry rotation speed was 250 msecs. Collimation was 0.6 mm. A 100 kV prospective scan was triggered in the ascending thoracic aorta at 35-75% of the R-R interval. Average HR during the scan was 60 bpm. The 3D data set was interpreted on a dedicated work station using MPR, MIP and VRT modes. A total of 80cc of contrast was used. FINDINGS: Non-cardiac: See separate report from [facility]. Pulmonary veins drain normally to the left atrium. No LA appendage thrombus. Calcium Score: 457 Agatston units. Coronary Arteries: Right dominant with no anomalies LM: Calcified plaque distal left main, mild (1-24%) stenosis. LAD system: Ostial calcified plaque, mild (1-24%) stenosis. Mixed plaque proximal LAD, mild (25-49%) stenosis. Circumflex system: No plaque or stenosis. RCA system: Difficult to assess due to artifact. By using multiple phases, suspect only mild (1-24%) stenosis with calcified plaque in proximal and mid RCA. IMPRESSION: 1. Coronary calcium score 457 Agatston units. This places the patient in the 73rd percentile for age and gender, suggesting intermediate risk for future cardiac events. 2. Very difficult images due to artifact, but suspect no more than mild  stenosis in the coronary tree. Dalton Mclean Electronically Signed   By: Marca Ancona M.D.   On: 09/23/2022 15:40   Result Date: 09/23/2022 EXAM: OVER-READ INTERPRETATION  CT CHEST The following report is a limited chest CT over-read performed by radiologist Dr. Allegra Lai of [facility] on 09/23/2022. This over-read does not include interpretation of cardiac or coronary anatomy or pathology. The coronary  CTA interpretation by the cardiologist is attached. COMPARISON:  None Available. FINDINGS: Vascular: Cardiomegaly. No pericardial effusion. Normal caliber thoracic aorta with moderate atherosclerotic disease. Mediastinum/Nodes: Moderate hiatal hernia. Patulous and fluid-filled esophagus. No pathologically enlarged lymph nodes seen in the chest. Lungs/Pleura: Lungs are clear. No pleural effusion or pneumothorax. Upper Abdomen: Partially visualized lucency under the right hemidiaphragm, concerning for free air. Musculoskeletal: No chest wall mass or suspicious bone lesions identified. IMPRESSION: 1. Air under the right hemidiaphragm which could be due to pneumoperitoneum or partially visualized bowel loop. Recommend further evaluation with CT or abdominal radiograph depending on level concern clinical concern. 2. Aortic Atherosclerosis (ICD10-I70.0). 3. Moderate hiatal hernia. 4. Patulous and fluid-filled esophagus, findings can be seen in the setting of esophageal dysmotility. Critical Value/emergent results were called by telephone at the time of interpretation on 09/23/2022 at 1:23 pm to provider [facility], who verbally acknowledged these results. Electronically Signed: By: Allegra Lai M.D. On: 09/23/2022 13:27   CT ABDOMEN PELVIS WO CONTRAST  Result Date: 09/23/2022 CLINICAL DATA:  Acute generalized abdominal pain. EXAM: CT ABDOMEN AND PELVIS WITHOUT CONTRAST TECHNIQUE: Multidetector CT imaging of the abdomen and pelvis was performed following the standard protocol without IV  contrast. RADIATION DOSE REDUCTION: This exam was performed according to the departmental dose-optimization program which includes automated exposure control, adjustment of the mA and/or kV according to patient size and/or use of iterative reconstruction technique. COMPARISON:  CT of today.  CT of November 22, 2021. FINDINGS: Lower chest: No acute abnormality. Hepatobiliary: No focal liver abnormality is seen. No gallstones, gallbladder wall thickening, or biliary dilatation. Pancreas: Unremarkable. No pancreatic ductal dilatation or surrounding inflammatory changes. Spleen: Normal in size without focal abnormality. Adrenals/Urinary Tract: Adrenal glands are unremarkable. Kidneys are normal, without renal calculi, focal lesion, or hydronephrosis. Bladder is unremarkable. Stomach/Bowel: Moderate size sliding-type hiatal hernia is noted. There is no evidence of bowel obstruction or inflammation. Vascular/Lymphatic: Aortic atherosclerosis. No enlarged abdominal or pelvic lymph nodes. Reproductive: Status post hysterectomy. No adnexal masses. Other: No abdominal wall hernia or abnormality. No abdominopelvic ascites. No pneumoperitoneum is noted. The abnormality in question on prior exam represented bowel. Musculoskeletal: Status post surgical posterior fusion extending from L2-S1. No acute osseous abnormality is noted. IMPRESSION: Moderate size sliding-type hiatal hernia. There is no evidence of pneumoperitoneum. No acute abnormality seen in the abdomen or pelvis. Aortic Atherosclerosis (ICD10-I70.0). Electronically Signed   By: Lupita Raider M.D.   On: 09/23/2022 15:02   DG Chest Port 1 View  Result Date: 09/22/2022 CLINICAL DATA:  Chest pain, difficulty breathing. EXAM: PORTABLE CHEST 1 VIEW COMPARISON:  None Available. FINDINGS: Heart is enlarged, unchanged. Both lungs are clear. The visualized skeletal structures are unremarkable. IMPRESSION: No active disease. Cardiomegaly. Electronically Signed   By: Darliss Cheney M.D.   On: 09/22/2022 17:44    Scheduled Meds:  apixaban  5 mg Oral BID   buPROPion  300 mg Oral Daily   clonazePAM  0.5 mg Oral BID   DULoxetine  60 mg Oral Daily   loratadine  10 mg Oral Daily   metoprolol tartrate  100 mg Oral BID   pantoprazole  40 mg Oral Daily   pregabalin  50 mg Oral Daily   Continuous Infusions:   LOS: 0 days   Marguerita Merles, DO [facility] Available via Epic secure chat 7am-7pm After these hours, please refer to coverage provider listed on [email or web address] 09/24/2022, 12:19 PM

## 2022-09-24 NOTE — Evaluation (Signed)
Occupational Therapy Evaluation Patient Details Name: Julie Rice MRN: [id number] DOB: [birth year] Today's Date: 09/24/2022   History of Present Illness patient is an 86 year old Caucasian female admitted 11/19 who presented to the emergency room with chest burning and palpitations.  PT with SVT and PAF and cardiac workup underway.  PMH: hypertension, rheumatoid arthritis, chronic diastolic CHF, COPD, chronic respiratory failure on 2-1/2 L of oxygen nightly, esophageal strictures as well as GERD and depression and anxiety   Clinical Impression   Pt reports independence at baseline with ADLs and functional mobility, lives alone but has family/neighbors that could assist PRN. Pt currently needing set up -mod A for ADLs, min A for transfers with RW. Pt with heavy reliance on RW, and fatigues quickly with 3/4 DOE with walking short distance in room. Pt presenting with impairments listed below, will  follow acutely. Recommend SNF at d/c pending progression.      Recommendations for follow up therapy are one component of a multi-disciplinary discharge planning process, led by the attending physician.  Recommendations may be updated based on patient status, additional functional criteria and insurance authorization.   Follow Up Recommendations  Skilled nursing-short term rehab (<3 hours/day) (pending progression)     Assistance Recommended at Discharge Intermittent Supervision/Assistance  Patient can return home with the following A little help with walking and/or transfers;A lot of help with bathing/dressing/bathroom;Assistance with cooking/housework;Help with stairs or ramp for entrance;Assist for transportation    Functional Status Assessment  Patient has had a recent decline in their functional status and demonstrates the ability to make significant improvements in function in a reasonable and predictable amount of time.  Equipment Recommendations  None recommended by OT (pt has all needed  DME)    Recommendations for Other Services PT consult     Precautions / Restrictions Precautions Precautions: Fall Restrictions Weight Bearing Restrictions: No      Mobility Bed Mobility               General bed mobility comments: up in chair upon arrival and departure    Transfers Overall transfer level: Needs assistance Equipment used: Rolling walker (2 wheels) Transfers: Sit to/from Stand Sit to Stand: Min assist                  Balance Overall balance assessment: Needs assistance Sitting-balance support: No upper extremity supported, Feet supported, Bilateral upper extremity supported Sitting balance-Leahy Scale: Good Sitting balance - Comments: sits at EOB and can reach toward feet for LB dressing without LOB   Standing balance support: Single extremity supported, During functional activity Standing balance-Leahy Scale: Poor Standing balance comment: reliant on external support                           ADL either performed or assessed with clinical judgement   ADL Overall ADL's : Needs assistance/impaired Eating/Feeding: Set up   Grooming: Set up   Upper Body Bathing: Min guard   Lower Body Bathing: Moderate assistance   Upper Body Dressing : Min guard   Lower Body Dressing: Moderate assistance   Toilet Transfer: Min guard   Toileting- Clothing Manipulation and Hygiene: Min guard       Functional mobility during ADLs: Min guard;Rolling walker (2 wheels)       Vision   Vision Assessment?: No apparent visual deficits     Perception Perception Perception Tested?: No   Praxis Praxis Praxis tested?: Not tested    Pertinent Vitals/Pain  Pain Assessment Pain Assessment: Faces Pain Score: 4  Faces Pain Scale: Hurts little more Pain Location: chest pressure Pain Descriptors / Indicators: Aching, Grimacing, Guarding Pain Intervention(s): Limited activity within patient's tolerance, Monitored during session, Repositioned      Hand Dominance Right   Extremity/Trunk Assessment     Lower Extremity Assessment Lower Extremity Assessment: Defer to PT evaluation   Cervical / Trunk Assessment Cervical / Trunk Assessment: Kyphotic   Communication Communication Communication: No difficulties   Cognition Arousal/Alertness: Awake/alert Behavior During Therapy: WFL for tasks assessed/performed Overall Cognitive Status: Within Functional Limits for tasks assessed                                       General Comments  VSS on RA; BP 123/63 (76), HR 80-90, SpO2 100% on RA    Exercises     Shoulder Instructions      Home Living Family/patient expects to be discharged to:: Private residence Living Arrangements: Alone Available Help at Discharge: Family;Available PRN/intermittently (brother and SIL and neighbor) Type of Home: House Home Access: Stairs to enter [employer] of Steps: 2 Entrance Stairs-Rails: None Home Layout: One level     Bathroom Shower/Tub: Chief Strategy Officer: Handicapped height     Home Equipment: Agricultural consultant (2 wheels);BSC/3in1;Tub bench;Cane - quad;Grab bars - tub/shower   Additional Comments: wears O2 at night, 2.5L      Prior Functioning/Environment Prior Level of Function : Independent/Modified Independent             Mobility Comments: Pt used quad cane to walk, has had a few falls per pt ADLs Comments: ind; reports sponge bathing as of recently        OT Problem List: Decreased strength;Decreased range of motion;Decreased activity tolerance;Impaired balance (sitting and/or standing)      OT Treatment/Interventions: Self-care/ADL training;Therapeutic exercise;Energy conservation;DME and/or AE instruction;Therapeutic activities;Patient/family education;Balance training    OT Goals(Current goals can be found in the care plan section) Acute Rehab OT Goals Patient Stated Goal: none stated OT Goal Formulation: With  patient Time For Goal Achievement: 10/08/22 Potential to Achieve Goals: Good  OT Frequency: Min 2X/week    Co-evaluation              AM-PAC OT "6 Clicks" Daily Activity     Outcome Measure Help from another person eating meals?: None Help from another person taking care of personal grooming?: A Little Help from another person toileting, which includes using toliet, bedpan, or urinal?: A Little Help from another person bathing (including washing, rinsing, drying)?: A Lot Help from another person to put on and taking off regular upper body clothing?: A Little Help from another person to put on and taking off regular lower body clothing?: A Little 6 Click Score: 18   End of Session Equipment Utilized During Treatment: Gait belt;Rolling walker (2 wheels) Nurse Communication: Mobility status  Activity Tolerance: Patient tolerated treatment well Patient left: in chair;with call bell/phone within reach;with chair alarm set  OT Visit Diagnosis: Unsteadiness on feet (R26.81);Other abnormalities of gait and mobility (R26.89);Muscle weakness (generalized) (M62.81)                Time: 6295-2841 OT Time Calculation (min): 22 min Charges:  OT General Charges $OT Visit: 1 Visit OT Evaluation $OT Eval Moderate Complexity: 1 Mod  Lenell Mcconnell K, OTD, OTR/L SecureChat Preferred Acute Rehab [phone]  Ulla Gallo 09/24/2022, 10:06 AM

## 2022-09-24 NOTE — Plan of Care (Signed)

## 2022-09-24 NOTE — Plan of Care (Signed)
?  Problem: Clinical Measurements: ?Goal: Respiratory complications will improve ?Outcome: Progressing ?  ?Problem: Elimination: ?Goal: Will not experience complications related to urinary retention ?Outcome: Progressing ?  ?Problem: Safety: ?Goal: Ability to remain free from injury will improve ?Outcome: Progressing ?  ?

## 2022-09-24 NOTE — TOC Progression Note (Signed)
Transition of Care [facility]) - Progression Note    Patient Details  Name: Julie Rice MRN: [id number] Date of Birth: 1936/08/27  [facility]) CM/SW Contact  Leone Haven, RN Phone Number: 09/24/2022, 11:02 AM  Clinical Narrative:    From home, Arrhythmia, for coronary CT scan , also may have a stress test.  TOC following.        Expected Discharge Plan and Services                                                 Social Determinants of Health (SDOH) Interventions    Readmission Risk Interventions     No data to display

## 2022-09-24 NOTE — Progress Notes (Addendum)
Rounding Note    Patient Name: Julie Rice Date of Encounter: 09/24/2022  [facility] Cardiologist: Charlton Haws, MD   Subjective   She had one of her typical episodes of palpitations associated with retrosternal pressure radiating to her arms yesterday evening around 10:30 PM. The monitor shows irregular wide-complex rhythm, mildly tachycardic at about 105 bpm.  Occasionally with narrow complex beats interspersed.  It is possible that this is atrial fibrillation with aberrant conduction. This morning she is in a bigeminal rhythm (PACs are conducted with aberrancy).  She is asymptomatic. Although coronary CT angio showed substantial diffuse atherosclerosis, she does not have any meaningful coronary stenoses.  Inpatient Medications    Scheduled Meds:  apixaban  5 mg Oral BID   buPROPion  300 mg Oral Daily   clonazePAM  0.5 mg Oral BID   DULoxetine  60 mg Oral Daily   loratadine  10 mg Oral Daily   metoprolol tartrate  100 mg Oral BID   pantoprazole  40 mg Oral Daily   pregabalin  50 mg Oral Daily   Continuous Infusions:  PRN Meds: acetaminophen **OR** acetaminophen, albuterol, bismuth subsalicylate, senna-docusate   Vital Signs    Vitals:   09/24/22 0052 09/24/22 0446 09/24/22 0813 09/24/22 1117  BP: (!) 145/62 (!) 128/53 (!) 114/50 111/64  Pulse:  63 83 89  Resp: 18 18 18 16   Temp: (!) 97.4 F (36.3 C) (!) 97.5 F (36.4 C) 98 F (36.7 C) 97.9 F (36.6 C)  TempSrc: Oral Oral Oral Oral  SpO2: 96% 100% 98% 96%  Weight: 65.5 kg     Height:        Intake/Output Summary (Last 24 hours) at 09/24/2022 1124 Last data filed at 09/24/2022 1100 Gross per 24 hour  Intake 720 ml  Output 600 ml  Net 120 ml      09/24/2022   12:52 AM 09/23/2022    4:34 PM 09/22/2022    5:22 PM  Last 3 Weights  Weight (lbs) 144 lb 8 oz 145 lb 4.5 oz 150 lb  Weight (kg) 65.545 kg 65.9 kg 68.04 kg      Telemetry    Sinus rhythm with very frequent premature atrial  contractions, many of them conducted with wide QRS complex (aberrancy versus accessory pathway), often in a pattern of bigeminy; sustained episode of irregular wide-complex tachycardia yesterday evening around 2240 hours, unfortunately with lots of baseline artifact; suspect this was a brief episode of atrial fibrillation with RVR and aberrant conduction versus accessory pathway conduction- Personally Reviewed  ECG    No new tracing- Personally Reviewed  Physical Exam  Appears very comfortable sitting in the chair, GEN: No acute distress.   Neck: No JVD Cardiac: Bigeminal rhythm, no murmurs, rubs, or gallops.  Respiratory: Clear to auscultation bilaterally. GI: Soft, nontender, non-distended  MS: No edema; No deformity. Neuro:  Nonfocal  Psych: Normal affect   Labs    High Sensitivity Troponin:   Recent Labs  Lab 09/22/22 1705 09/22/22 2000 09/23/22 0201  TROPONINIHS 9 11 11      Chemistry Recent Labs  Lab 09/22/22 1705 09/22/22 1719 09/23/22 0201 09/24/22 0025  NA 138 137 137 137  K 3.5 3.5 4.3 4.9  CL 101  --  100 102  CO2 26  --  26 27  GLUCOSE 102*  --  95 150*  BUN 12  --  13 10  CREATININE 0.89  --  0.94 0.89  CALCIUM 8.9  --  8.7* 9.1  MG  --   --  2.3 2.4  PROT 5.8*  --   --  5.8*  ALBUMIN 3.1*  --   --  3.3*  AST 17  --   --  16  ALT 15  --   --  14  ALKPHOS 64  --   --  58  BILITOT 0.6  --   --  0.3  GFRNONAA >60  --  60* >60  ANIONGAP 11  --  11 8    Lipids No results for input(s): "CHOL", "TRIG", "HDL", "LABVLDL", "LDLCALC", "CHOLHDL" in the last 168 hours.  Hematology Recent Labs  Lab 09/22/22 1705 09/22/22 1719 09/23/22 0940 09/24/22 0025  WBC 12.5*  --  11.0* 12.4*  RBC 3.28*  --  3.39* 3.42*  HGB 9.8* 10.2* 10.1* 10.0*  HCT 30.5* 30.0* 31.5* 32.2*  MCV 93.0  --  92.9 94.2  MCH 29.9  --  29.8 29.2  MCHC 32.1  --  32.1 31.1  RDW 15.0  --  15.3 15.1  PLT 339  --  352 354   Thyroid  Recent Labs  Lab 09/24/22 0025  TSH 1.528     BNP Recent Labs  Lab 09/22/22 1730  BNP 438.0*    DDimer  Recent Labs  Lab 09/22/22 1705  DDIMER <0.27     Radiology    CT CORONARY MORPH W/CTA COR W/SCORE W/CA W/CM &/OR WO/CM  Addendum Date: 09/23/2022   ADDENDUM REPORT: 09/23/2022 15:40 CLINICAL DATA:  Chest pain EXAM: Cardiac CTA MEDICATIONS: Sub lingual nitro. 4mg  x 2 TECHNIQUE: The patient was scanned on a Siemens 192 slice scanner. Gantry rotation speed was 250 msecs. Collimation was 0.6 mm. A 100 kV prospective scan was triggered in the ascending thoracic aorta at 35-75% of the R-R interval. Average HR during the scan was 60 bpm. The 3D data set was interpreted on a dedicated work station using MPR, MIP and VRT modes. A total of 80cc of contrast was used. FINDINGS: Non-cardiac: See separate report from [facility]. Pulmonary veins drain normally to the left atrium. No LA appendage thrombus. Calcium Score: 457 Agatston units. Coronary Arteries: Right dominant with no anomalies LM: Calcified plaque distal left main, mild (1-24%) stenosis. LAD system: Ostial calcified plaque, mild (1-24%) stenosis. Mixed plaque proximal LAD, mild (25-49%) stenosis. Circumflex system: No plaque or stenosis. RCA system: Difficult to assess due to artifact. By using multiple phases, suspect only mild (1-24%) stenosis with calcified plaque in proximal and mid RCA. IMPRESSION: 1. Coronary calcium score 457 Agatston units. This places the patient in the 73rd percentile for age and gender, suggesting intermediate risk for future cardiac events. 2. Very difficult images due to artifact, but suspect no more than mild stenosis in the coronary tree. Dalton Mclean Electronically Signed   By: Marca Ancona M.D.   On: 09/23/2022 15:40   Result Date: 09/23/2022 EXAM: OVER-READ INTERPRETATION  CT CHEST The following report is a limited chest CT over-read performed by radiologist Dr. Allegra Lai of [facility] on 09/23/2022. This over-read does  not include interpretation of cardiac or coronary anatomy or pathology. The coronary CTA interpretation by the cardiologist is attached. COMPARISON:  None Available. FINDINGS: Vascular: Cardiomegaly. No pericardial effusion. Normal caliber thoracic aorta with moderate atherosclerotic disease. Mediastinum/Nodes: Moderate hiatal hernia. Patulous and fluid-filled esophagus. No pathologically enlarged lymph nodes seen in the chest. Lungs/Pleura: Lungs are clear. No pleural effusion or pneumothorax. Upper Abdomen: Partially visualized lucency under the right  hemidiaphragm, concerning for free air. Musculoskeletal: No chest wall mass or suspicious bone lesions identified. IMPRESSION: 1. Air under the right hemidiaphragm which could be due to pneumoperitoneum or partially visualized bowel loop. Recommend further evaluation with CT or abdominal radiograph depending on level concern clinical concern. 2. Aortic Atherosclerosis (ICD10-I70.0). 3. Moderate hiatal hernia. 4. Patulous and fluid-filled esophagus, findings can be seen in the setting of esophageal dysmotility. Critical Value/emergent results were called by telephone at the time of interpretation on 09/23/2022 at 1:23 pm to provider [facility], who verbally acknowledged these results. Electronically Signed: By: Allegra Lai M.D. On: 09/23/2022 13:27   CT ABDOMEN PELVIS WO CONTRAST  Result Date: 09/23/2022 CLINICAL DATA:  Acute generalized abdominal pain. EXAM: CT ABDOMEN AND PELVIS WITHOUT CONTRAST TECHNIQUE: Multidetector CT imaging of the abdomen and pelvis was performed following the standard protocol without IV contrast. RADIATION DOSE REDUCTION: This exam was performed according to the departmental dose-optimization program which includes automated exposure control, adjustment of the mA and/or kV according to patient size and/or use of iterative reconstruction technique. COMPARISON:  CT of today.  CT of November 22, 2021. FINDINGS: Lower chest: No acute  abnormality. Hepatobiliary: No focal liver abnormality is seen. No gallstones, gallbladder wall thickening, or biliary dilatation. Pancreas: Unremarkable. No pancreatic ductal dilatation or surrounding inflammatory changes. Spleen: Normal in size without focal abnormality. Adrenals/Urinary Tract: Adrenal glands are unremarkable. Kidneys are normal, without renal calculi, focal lesion, or hydronephrosis. Bladder is unremarkable. Stomach/Bowel: Moderate size sliding-type hiatal hernia is noted. There is no evidence of bowel obstruction or inflammation. Vascular/Lymphatic: Aortic atherosclerosis. No enlarged abdominal or pelvic lymph nodes. Reproductive: Status post hysterectomy. No adnexal masses. Other: No abdominal wall hernia or abnormality. No abdominopelvic ascites. No pneumoperitoneum is noted. The abnormality in question on prior exam represented bowel. Musculoskeletal: Status post surgical posterior fusion extending from L2-S1. No acute osseous abnormality is noted. IMPRESSION: Moderate size sliding-type hiatal hernia. There is no evidence of pneumoperitoneum. No acute abnormality seen in the abdomen or pelvis. Aortic Atherosclerosis (ICD10-I70.0). Electronically Signed   By: Lupita Raider M.D.   On: 09/23/2022 15:02   DG Chest Port 1 View  Result Date: 09/22/2022 CLINICAL DATA:  Chest pain, difficulty breathing. EXAM: PORTABLE CHEST 1 VIEW COMPARISON:  None Available. FINDINGS: Heart is enlarged, unchanged. Both lungs are clear. The visualized skeletal structures are unremarkable. IMPRESSION: No active disease. Cardiomegaly. Electronically Signed   By: Darliss Cheney M.D.   On: 09/22/2022 17:44    Cardiac Studies    7 DAY MONITOR: 03/20/2022 Patch wear time was 6 days and 23 hours Predominant rhythm was NSR with average HR 65bpm 14 runs of SVT with longest lasting 2 hours and at HR 128bpm Occasional SVE (1.1%), rare VE (<1%) Patient triggered events correlate with SVT and PVCs No Afib  or significant pauses seen   ECHO: 03/15/2022  1. Left ventricular ejection fraction, by estimation, is 50 to 55%. The left ventricle has low normal function. The left ventricle has no regional wall motion abnormalities. There is mild left ventricular hypertrophy. Left ventricular diastolic  parameters are consistent with Grade II diastolic dysfunction  (pseudonormalization). Elevated left atrial pressure.   2. Right ventricular systolic function is normal. The right ventricular size is mildly enlarged. There is moderately elevated pulmonary artery systolic pressure. The estimated right ventricular systolic pressure is 57.5 mmHg.   3. Left atrial size was severely dilated.   4. Right atrial size was mildly dilated.   5. The mitral valve  is degenerative. Mild to moderate mitral valve regurgitation. No evidence of mitral stenosis. Moderate mitral annular calcification.   6. Tricuspid valve regurgitation is moderate.   7. The aortic valve is tricuspid. Aortic valve regurgitation is mild. Aortic valve sclerosis is present, with no evidence of aortic valve stenosis.   8. The inferior vena cava is normal in size with greater than 50% respiratory variability, suggesting right atrial pressure of 3 mmHg.    CARDIAC CATH: 07/26/2019 Right dominant coronary anatomy Widely patent left main 30 to 40% mid LAD Widely patent circumflex Luminal irregularities in a dominant right coronary. Right heart pressures are relatively low.  Pulmonary wedge pressure mean is 2 mmHg. LVEDP 4 mmHg.  Left ventriculography by hand-injection was not helpful.  From images obtained, LV function appears relatively normal.1  EP study 04/25/2021 .. .. "AEST was performed which again revealed no AH jumps. At 400/350 msec the patient had  sustained narrow complex SVT. The tachycardia cycle length was 375 msec. The surface EKG was consistent with the patient's clinical tachycardia. VA time during tachycardia measured with  earliest retrograde atrial activation recorded from the CS 910 electrogram. The tachycardia terminated spontaneously with an atrial activation.  This was a reproducible event. V pacing was performed during tachycardia terminated tachycardia.  Adenosine 12 mg was given which also terminated tachycardia.  Due to the long VA time, and early on CS 910, it is thought that this patient had ORT. .. ..  Ablation:  A 108F Bios[employer] Webster DF 65mm ablation catheter was therefore advanced through the right femoral vein and advanced into the right atrium.  Three-dimensional mapping of the right atrium was performed.  During ventricular pacing, somewhat fused signals were found on the floor of the coronary sinus.  Local ablation catheter signals were earlier than atrial signals on the hiss or coronary sinus catheter.  Tachycardia was again induced.  During tachycardia, early signals were also found on the floor of the coronary sinus.  The signals were more distal into the coronary sinus, though just inside the os.  Tachycardia terminated.  With pressure in this area, tachycardia was not inducible.  Ablation was performed in this area.  After ablation, no further tachycardia was induced.  .. .."  Patient Profile     86 y.o. 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  Assessment & Plan    SVT/parox Afib: Telemetry shows frequent PACs and other atrial arrhythmia often conducted with  wide-complex QRS.  Sustained episodes of arrhythmia which probably represent atrial fibrillation  appear to be responsible for her symptoms.  Since this occurred spontaneously last evening, I do not think we necessarily need to perform a treadmill stress test.  Wide QRS complexes may be related to recurrent conduction through the ablated accessory pathway or a previously undiagnosed second accessory pathway.   She is on chronic anticoagulation with apixaban 5 mg twice daily.  She is on relatively high doses of beta-blockers (increased from 75 mg twice daily to 100 mg twice daily on this admission).  We will request EP consultation.  Not sure if antiarrhythmics or repeat ablation would be the next best step.  CAD: widespread plaque  without obstructive lesions by coronary CT angiography performed during this admission.  The focus is on risk factor modification, in particular keeping her LDL cholesterol less than 70. CHF: appears to be clinically euvolemic.  Currently not on diuretics. PAH: Reported on echo from May 2023, systolic PA pressure estimated 57 mmHg.  Might need right heart catheterization at some point.     SYMPTOMATIC EVENT        For questions or updates, please contact [facility] Please consult [email or web address] for contact info under        Signed, Thurmon Fair, MD  09/24/2022, 11:24 AM

## 2022-09-24 NOTE — TOC Initial Note (Addendum)
Transition of Care [facility]) - Initial/Assessment Note    Patient Details  Name: Julie Rice MRN: [id number] Date of Birth: 03-28-1936  Transition of Care [facility]) CM/SW Contact:    Bethann Berkshire, [place] Phone Number: 09/24/2022, 11:57 AM  Clinical Narrative:                  CSW met with pt to discuss SNF rec. She lives at home alone; has a brother in [place] and Yolanda Bonine is in [place] though she does not see them much. Pt is agreeable to SNF workup. CSW explained medicare snf coverage and insurance auth process. Fl2 completed and bed requests faxed in hub.   PASRR pending.   1615Rosalie Gums received: [id number] E expires 10/24/22  Expected Discharge Plan: [place] Barriers to Discharge: Continued Medical Work up   Patient Goals and CMS Choice        Expected Discharge Plan and Services Expected Discharge Plan: [place]       Living arrangements for the past 2 months: Single Family Home                                      Prior Living Arrangements/Services Living arrangements for the past 2 months: Single Family Home Lives with:: Self Patient language and need for interpreter reviewed:: Yes        Need for Family Participation in Patient Care: No (Comment) Care giver support system in place?: No (comment)   Criminal Activity/Legal Involvement Pertinent to Current Situation/Hospitalization: No - Comment as needed  Activities of Daily Living Home Assistive Devices/Equipment: None ADL Screening (condition at time of admission) Patient's cognitive ability adequate to safely complete daily activities?: Yes Is the patient deaf or have difficulty hearing?: No Does the patient have difficulty seeing, even when wearing glasses/contacts?: No Does the patient have difficulty concentrating, remembering, or making decisions?: No Patient able to express need for assistance with ADLs?: Yes Does the patient have difficulty dressing or  bathing?: No Independently performs ADLs?: Yes (appropriate for developmental age) Does the patient have difficulty walking or climbing stairs?: No Weakness of Legs: None Weakness of Arms/Hands: None  Permission Sought/Granted   Permission granted to share information with : Yes, Verbal Permission Granted  Share Information with NAME: Shterna, Laramee Yolanda Bonine)  [phone] (Mobile)           Emotional Assessment Appearance:: Appears stated age Attitude/Demeanor/Rapport: Engaged Affect (typically observed): Accepting Orientation: : Oriented to Self, Oriented to Place, Oriented to Situation, Oriented to  Time Alcohol / Substance Use: Not Applicable Psych Involvement: No (comment)  Admission diagnosis:  Arrhythmia [I49.9] Exertional dyspnea [R06.09] Acute on chronic diastolic congestive heart failure (HCC) [I50.33] Patient Active Problem List   Diagnosis Date Noted   Arrhythmia 09/22/2022   Idiopathic peripheral neuropathy 07/31/2022   Paroxysmal SVT (supraventricular tachycardia) 05/14/2022   Coronary artery disease involving native coronary artery of native heart without angina pectoris 05/14/2022   Wide-complex tachycardia 04/03/2021   Atrial fibrillation ([place]) 10/15/2020   Chronic pain syndrome 02/16/2020   Fibromyalgia 02/16/2020   Myofascial pain 02/16/2020   Rheumatoid arthritis involving multiple sites ([place]) 02/16/2020   Pleural effusion, bilateral 09/08/2019   Secondary cardiomyopathy ([place])    Elevated troponin 08/17/2019   Chronic respiratory failure with hypoxia ([place]) 08/16/2019   COPD (chronic obstructive pulmonary disease) ([place]) 08/16/2019   PAF (paroxysmal atrial fibrillation) ([place]) 08/16/2019   Community acquired pneumonia  of left lung 08/07/2019   Prolonged QT interval 08/07/2019   Sepsis ([place]) 08/07/2019   Acute on chronic respiratory failure with hypoxia ([place]) 08/07/2019   (HFpEF) heart failure with preserved ejection fraction ([place]) 07/24/2019   Chest pain     DOE (dyspnea on exertion)    Asthma 04/22/2019   Abnormal CT of the chest 02/23/2019   Esophageal motility disorder    Esophageal dysmotility    Loss of weight    Moderate protein-calorie malnutrition ([place])    Hematemesis 04/06/2018   Periesophageal hiatal hernia 08/02/2016   Dysphagia 05/10/2016   Esophageal stricture 05/10/2016   Epigastric pain 05/10/2016   Generalized anxiety disorder 02/06/2012   Major depressive disorder, recurrent ([place]) 02/05/2012   Hypokalemia 11/09/2011   Nausea and vomiting in adult 11/08/2011   Dehydration 11/08/2011   Tremors of nervous system 11/08/2011   Hypothyroidism 05/10/2008   Dyslipidemia 05/10/2008   UNSPECIFIED ANEMIA 05/10/2008   CAROTID ARTERY DISEASE 05/10/2008   Transient cerebral ischemia 05/10/2008   Chronic back pain 05/10/2008   OSTEOPENIA 05/10/2008   Dyspnea 05/10/2008   Upper airway cough syndrome 05/10/2008   ESOPHAGEAL STRICTURE 01/19/2008   Essential hypertension 04/29/2007   Hiatal hernia with gastroesophageal reflux 04/29/2007   Osteoarthritis 04/29/2007   SPONDYLOSIS 04/29/2007   PCP:  [name], NP Pharmacy:   [id number] [place], [place] - [address]: [phone]: [phone] [facility] [address]Phone: [phone]: [phone]    Social Determinants of Health (SDOH) Interventions    Readmission Risk Interventions     No data to display

## 2022-09-24 NOTE — Plan of Care (Signed)
  Problem: Education: Goal: Knowledge of General Education information will improve Description: Including pain rating scale, medication(s)/side effects and non-pharmacologic comfort measures Outcome: Progressing   Problem: Clinical Measurements: Goal: Ability to maintain clinical measurements within normal limits will improve Outcome: Progressing   

## 2022-09-25 ENCOUNTER — Observation Stay (HOSPITAL_COMMUNITY): Payer: Medicare Other

## 2022-09-25 ENCOUNTER — Other Ambulatory Visit: Payer: Self-pay | Admitting: Cardiology

## 2022-09-25 DIAGNOSIS — I10 Essential (primary) hypertension: Secondary | ICD-10-CM | POA: Diagnosis not present

## 2022-09-25 DIAGNOSIS — K224 Dyskinesia of esophagus: Secondary | ICD-10-CM | POA: Diagnosis not present

## 2022-09-25 DIAGNOSIS — I471 Supraventricular tachycardia, unspecified: Secondary | ICD-10-CM | POA: Diagnosis not present

## 2022-09-25 DIAGNOSIS — J9611 Chronic respiratory failure with hypoxia: Secondary | ICD-10-CM | POA: Diagnosis not present

## 2022-09-25 DIAGNOSIS — I25118 Atherosclerotic heart disease of native coronary artery with other forms of angina pectoris: Secondary | ICD-10-CM | POA: Diagnosis not present

## 2022-09-25 DIAGNOSIS — I4719 Other supraventricular tachycardia: Secondary | ICD-10-CM | POA: Diagnosis not present

## 2022-09-25 DIAGNOSIS — I2721 Secondary pulmonary arterial hypertension: Secondary | ICD-10-CM

## 2022-09-25 DIAGNOSIS — R1314 Dysphagia, pharyngoesophageal phase: Secondary | ICD-10-CM

## 2022-09-25 DIAGNOSIS — J449 Chronic obstructive pulmonary disease, unspecified: Secondary | ICD-10-CM | POA: Diagnosis not present

## 2022-09-25 DIAGNOSIS — K2289 Other specified disease of esophagus: Secondary | ICD-10-CM | POA: Diagnosis not present

## 2022-09-25 LAB — COMPREHENSIVE METABOLIC PANEL
ALT: 16 U/L (ref 0–44)
AST: 18 U/L (ref 15–41)
Albumin: 3 g/dL — ABNORMAL LOW (ref 3.5–5.0)
Alkaline Phosphatase: 57 U/L (ref 38–126)
Anion gap: 9 (ref 5–15)
BUN: 20 mg/dL (ref 8–23)
CO2: 26 mmol/L (ref 22–32)
Calcium: 8.7 mg/dL — ABNORMAL LOW (ref 8.9–10.3)
Chloride: 100 mmol/L (ref 98–111)
Creatinine, Ser: 1.02 mg/dL — ABNORMAL HIGH (ref 0.44–1.00)
GFR, Estimated: 54 mL/min — ABNORMAL LOW (ref >60)
Glucose, Bld: 113 mg/dL — ABNORMAL HIGH (ref 70–99)
Potassium: 4.7 mmol/L (ref 3.5–5.1)
Sodium: 135 mmol/L (ref 135–145)
Total Bilirubin: 0.4 mg/dL (ref 0.3–1.2)
Total Protein: 5.7 g/dL — ABNORMAL LOW (ref 6.5–8.1)

## 2022-09-25 LAB — CBC WITH DIFFERENTIAL/PLATELET
Abs Immature Granulocytes: 0.09 10*3/uL — ABNORMAL HIGH (ref 0.00–0.07)
Basophils Absolute: 0.1 10*3/uL (ref 0.0–0.1)
Basophils Relative: 1 %
Eosinophils Absolute: 0.3 10*3/uL (ref 0.0–0.5)
Eosinophils Relative: 2 %
HCT: 31.3 % — ABNORMAL LOW (ref 36.0–46.0)
Hemoglobin: 10 g/dL — ABNORMAL LOW (ref 12.0–15.0)
Immature Granulocytes: 1 %
Lymphocytes Relative: 15 %
Lymphs Abs: 1.6 10*3/uL (ref 0.7–4.0)
MCH: 29.2 pg (ref 26.0–34.0)
MCHC: 31.9 g/dL (ref 30.0–36.0)
MCV: 91.5 fL (ref 80.0–100.0)
Monocytes Absolute: 1.1 10*3/uL — ABNORMAL HIGH (ref 0.1–1.0)
Monocytes Relative: 10 %
Neutro Abs: 7.8 10*3/uL — ABNORMAL HIGH (ref 1.7–7.7)
Neutrophils Relative %: 71 %
Platelets: 364 10*3/uL (ref 150–400)
RBC: 3.42 MIL/uL — ABNORMAL LOW (ref 3.87–5.11)
RDW: 14.8 % (ref 11.5–15.5)
WBC: 10.9 10*3/uL — ABNORMAL HIGH (ref 4.0–10.5)
nRBC: 0 % (ref 0.0–0.2)

## 2022-09-25 LAB — MAGNESIUM: Magnesium: 2.4 mg/dL (ref 1.7–2.4)

## 2022-09-25 LAB — PHOSPHORUS: Phosphorus: 4.1 mg/dL (ref 2.5–4.6)

## 2022-09-25 MED ORDER — PANTOPRAZOLE SODIUM 40 MG PO TBEC
40.0000 mg | DELAYED_RELEASE_TABLET | Freq: Every day | ORAL | Status: DC
  Administered 2022-09-26 – 2022-09-29 (×4): 40 mg via ORAL
  Filled 2022-09-25 (×4): qty 1

## 2022-09-25 NOTE — Plan of Care (Signed)

## 2022-09-25 NOTE — TOC Progression Note (Addendum)
Transition of Care [facility]) - Progression Note    Patient Details  Name: Julie Rice MRN: [id number] Date of Birth: 08/22/1936  Transition of Care [facility]) CM/SW [place], [place] Phone Number: 09/25/2022, 11:01 AM  Clinical Narrative:     CSW provided pt with SNF bed offers and medicare star ratings; will follow up for SNF choice.  1315: CSW met with pt bedside; her brother and SIL were in room as well. Pt requesting more time to review/discuss bed offers before making decision.   Expected Discharge Plan: [place] Barriers to Discharge: Continued Medical Work up  Expected Discharge Plan and Services Expected Discharge Plan: [place] arrangements for the past 2 months: Single Family Home                                       Social Determinants of Health (SDOH) Interventions    Readmission Risk Interventions     No data to display

## 2022-09-25 NOTE — Progress Notes (Signed)
TTE for pulmonary artery hypertension.

## 2022-09-25 NOTE — Consult Note (Addendum)
                                                                           [facility] Consult: 12:11 PM 09/25/2022  LOS: 0 days    Referring Provider:   Dr Gherghe  Primary Care Physician:  Nafziger, [name], NP Primary Gastroenterologist:  Dr. Perry     Reason for Consultation: Dysphagia.  History of esophageal stricture.   HPI: Julie Rice is a 86 y.o. female.  PMH hypertension.  RA.  COPD, chronic respiratory failure on nocturnal oxygen.  CHF.  Nonobstructive CAD on catheterization 2020.  SVT, previous ablation and then recurrence.  A-fib.  Echo 2023 with mild LVH, grade 2 diastolic dysfunction, biatrial dilation, elevated left atrial filling pressures and PA pressure 57 mmHg.  TIA.  Fibromyalgia.    Large hiatal hernia, status post fundoplication.  Esophageal strictures.  Post fundoplication developed dysphagia, Dr. Perry attributed to dysmotility.  Esophageal manometry studies were technically limited.  Dr. Perry has performed Botox injection and esophageal dilatations on several occasions which have been helpful.  Aspiration in October 2020 associated with worsening swallowing troubles.   08/12/2019 EGD:  Benign-appearing distal esophageal stricture, 18 mm diameter at GE junction with a small diverticulum just proximal to the stricture.  Stricture treated with balloon dilation and Botox injection.  Biopsies from the area obtained.Narrowing noted at the fundoplication which was treated with balloon dilation.  5 cm HH cw slipped fundoplication.  Several benign appearing gastric polyps in the gastric body were not biopsied. Path: Benign squamous mucosa with surface hyaline keratosis. Negative for dysplasia or malignancy, increased eosinophils.  Findings suggestive of reflux esophagitis. Stains negative for fungal elements.   Presented to ED 4 days ago complaining of burning chest pain,  palpitations.  Chest tightness with activity radiating to arms.. Cardiac CT: Calcium score 457 Agatston units. This places pt in 73rd percentile for age, gender, suggesting intermediate risk for future cardiac events. Cardiology and EP consulted for palpitations and wide complex tachycardia.  Nonischemic cardiac enzymes. EP Dr. Meleor has seen.  Rentry tachycardia on 04/2021 EP study.  Recommending amiodarone.  Currently in NSR. DOE persists but no resting SOB.  Chest discomfort resolved.    Going on 3 months of recurrent dysphagia.  Initially was mostly solid foods.  But now occasional dysphagia with liquids.  She feels food gets stuck at the level of the upper EUS.  Often able to wash this down with liquids but occasionally ends up regurgitating what ever she is trying to swallow.  No nausea.  Good dentition and able to chew her food well.  Compliant UTI, omeprazole 20 mg daily.  Currently receiving Protonix 40 po bid. over the last couple of weeks she has had some mild discomfort in her left upper abdomen/anterior thorax.  09/23/2022 CTAP wo contrast: Moderate sized, sliding HH.  No acute abnormality in abdomen or pelvis. 09/25/22 Barium esophagram:  chronic severe narrowing at the proximal stomach presumably at site of previous fundoplication similar to esophagram of 2020.  Delay in passage of barium tablet.  Esophageal dysmotility and tertiary contractions.  Mildly patulous esophagus.  No esophageal stricture.  Last Eliquis dose 0945 this morning.       Past Medical History:  Diagnosis Date   A-fib (HCC)    Anemia    Anxiety    Bipolar disorder (HCC)    Blood transfusion 1991   autologous pts own blood given    CAD (coronary artery disease)    Cataract    Chest pain    "@ rest, lying down, w/exertion"   CHF (congestive heart failure) (HCC) 07/2020   Chronic back pain    "mostly lower back but I do have upper back pain regularly" (04/06/2018)   COPD (chronic obstructive pulmonary  disease) (HCC)    Depression    Esophageal stricture    Fibromyalgia    GERD (gastroesophageal reflux disease)    Heart murmur    "slight" (04/06/2018)   Hiatal hernia    Hyperlipidemia    Patient denies   Hypertension    Internal hemorrhoids    Lumbago    Mitral regurgitation    Osteoarthritis    Osteoporosis    Pneumonia ~ 01/2018   PONV (postoperative nausea and vomiting)    severe ponv, "in the past" (04/06/2018)   Rectal bleeding    Rheumatoid arthritis (HCC)    Spondylosis    TIA (transient ischemic attack) 2013   Urinary incontinence    wears depends    Past Surgical History:  Procedure Laterality Date   ABDOMINAL HYSTERECTOMY  1982   BACK SURGERY     BALLOON DILATION N/A 09/21/2018   Procedure: BALLOON DILATION;  Surgeon: Perry, John N, MD;  Location: [facility];  Service: Endoscopy;  Laterality: N/A;   BALLOON DILATION N/A 08/12/2019   Procedure: BALLOON DILATION;  Surgeon: Gupta, Rajesh, MD;  Location: [facility];  Service: Endoscopy;  Laterality: N/A;   BIOPSY  08/12/2019   Procedure: BIOPSY;  Surgeon: Gupta, Rajesh, MD;  Location: [facility];  Service: Endoscopy;;   BLADDER SUSPENSION  1980's   BOTOX INJECTION N/A 09/21/2018   Procedure: BOTOX INJECTION;  Surgeon: Perry, John N, MD;  Location: [facility];  Service: Endoscopy;  Laterality: N/A;   BOTOX INJECTION N/A 08/12/2019   Procedure: BOTOX INJECTION;  Surgeon: Gupta, Rajesh, MD;  Location: [facility];  Service: Endoscopy;  Laterality: N/A;   CATARACT EXTRACTION W/ INTRAOCULAR LENS  IMPLANT, BILATERAL Bilateral 2010   DILATION AND CURETTAGE OF UTERUS  1961   ESOPHAGEAL MANOMETRY N/A 03/25/2018   Procedure: ESOPHAGEAL MANOMETRY (EM);  Surgeon: Nandigam, Kavitha V, MD;  Location: [facility];  Service: Endoscopy;  Laterality: N/A;   ESOPHAGOGASTRODUODENOSCOPY (EGD) WITH PROPOFOL N/A 04/07/2018   Procedure: ESOPHAGOGASTRODUODENOSCOPY (EGD) WITH PROPOFOL;  Surgeon: [name], Marquita Lias L III, MD;  Location: [facility];  Service: Gastroenterology;  Laterality: N/A;   ESOPHAGOGASTRODUODENOSCOPY (EGD) WITH PROPOFOL N/A 09/21/2018   Procedure: ESOPHAGOGASTRODUODENOSCOPY (EGD) WITH PROPOFOL;  Surgeon: Perry, John N, MD;  Location: [facility];  Service: Endoscopy;  Laterality: N/A;   ESOPHAGOGASTRODUODENOSCOPY (EGD) WITH PROPOFOL N/A 08/12/2019   Procedure: ESOPHAGOGASTRODUODENOSCOPY (EGD) WITH PROPOFOL;  Surgeon: Gupta, Rajesh, MD;  Location: [facility];  Service: Endoscopy;  Laterality: N/A;   HERNIA REPAIR     HIATAL HERNIA REPAIR N/A 08/02/2016   Procedure: LAPAROSCOPIC REPAIR OF LARGE  HIATAL HERNIA;  Surgeon: Benjamin Hoxworth, MD;  Location: [facility];  Service: General;  Laterality: N/A;   JOINT REPLACEMENT     LAPAROSCOPIC NISSEN FUNDOPLICATION N/A 08/02/2016   Procedure: LAPAROSCOPIC NISSEN FUNDOPLICATION;  Surgeon: Benjamin Hoxworth, MD;  Location: [facility];  Service: General;  Laterality: N/A;   LUMBAR LAMINECTOMY  1990; 1994; 1997;2000   "I've   got 2 stainless steel rods; 6 screws; 2 ray cages"took bone from right hip to put in back   RIGHT/LEFT HEART CATH AND CORONARY ANGIOGRAPHY N/A 07/26/2019   Procedure: RIGHT/LEFT HEART CATH AND CORONARY ANGIOGRAPHY;  Surgeon: Smith, Fareedah Mahler W, MD;  [facility];  Service: Cardiovascular;  Laterality: N/A;   SVT ABLATION N/A 04/25/2021   Procedure: SVT ABLATION;  Surgeon: Camnitz, Will Martin, MD;  [facility]: [facility];  Service: Cardiovascular;  Laterality: N/A;   TEE WITHOUT CARDIOVERSION N/A 08/19/2019   Procedure: TRANSESOPHAGEAL ECHOCARDIOGRAM (TEE);  Surgeon: Crenshaw, Brian S, MD;  Location: [facility];  Service: Cardiovascular;  Laterality: N/A;   TONSILLECTOMY AND ADENOIDECTOMY  1945   TOTAL KNEE ARTHROPLASTY Right ~ 1996   TUBAL LIGATION  ~ 1976    Prior to Admission medications   Medication Sig Start Date End Date Taking? Authorizing Provider  acetaminophen (TYLENOL) 500 MG tablet Take 1,000 mg by mouth every 6 (six)  hours as needed for moderate pain, headache or fever.   Yes [provider]  albuterol (VENTOLIN HFA) 108 (90 Base) MCG/ACT inhaler TAKE 2 PUFFS BY MOUTH EVERY 6 HOURS AS NEEDED FOR WHEEZE OR SHORTNESS OF BREATH Patient taking differently: Inhale 2 puffs into the lungs every 6 (six) hours as needed for wheezing or shortness of breath. 08/06/22  Yes Nafziger, [name], NP  buPROPion (WELLBUTRIN XL) 300 MG 24 hr tablet TAKE 1 TABLET BY MOUTH EVERY DAY 08/06/22  Yes Nafziger, [name], NP  Carboxymethylcellulose Sodium (REFRESH TEARS OP) Place 1 drop into both eyes 2 (two) times daily.   Yes [provider]  cetirizine (ZYRTEC) 10 MG tablet Take 10 mg by mouth daily as needed for allergies. 02/20/21  Yes [provider]  clonazePAM (KLONOPIN) 0.5 MG tablet Take 0.5 tablets (0.25 mg total) by mouth 2 (two) times daily as needed for anxiety. 09/11/22 10/11/22 Yes Nafziger, [name], NP  DULoxetine (CYMBALTA) 60 MG capsule TAKE 1 CAPSULE BY MOUTH EVERY DAY Patient taking differently: Take 60 mg by mouth daily. 11/30/21  Yes Lovorn, Megan, MD  ELIQUIS 5 MG TABS tablet TAKE 1 TABLET BY MOUTH TWICE A DAY Patient taking differently: Take 5 mg by mouth 2 (two) times daily. 05/08/22  Yes Camnitz, Will Martin, MD  FeFum-FePoly-FA-B Cmp-C-Biot (INTEGRA PLUS) CAPS Take 1 capsule by mouth every morning. 08/30/22  Yes Heilingoetter, Cassandra L, PA-C  KLOR-CON M20 20 MEQ tablet TAKE 1 TABLET BY MOUTH EVERY DAY 08/06/22  Yes Weaver, Scott T, PA-C  losartan (COZAAR) 50 MG tablet TAKE 1 TABLET BY MOUTH EVERY DAY 07/18/22  Yes Camnitz, Will Martin, MD  metoprolol tartrate (LOPRESSOR) 50 MG tablet Take 1.5 tablets (75 mg total) by mouth 2 (two) times daily. 07/18/22  Yes Camnitz, Will Martin, MD  Multiple Vitamins-Minerals (ONE-A-DAY WOMENS 50+) TABS Take 1 tablet by mouth daily.   Yes [provider]  omeprazole (PRILOSEC) 20 MG capsule TAKE 1 CAPSULE BY MOUTH EVERY DAY Patient taking differently: Take 20  mg by mouth daily. 08/28/22  Yes Nafziger, [name], NP  pregabalin (LYRICA) 50 MG capsule TAKE 1 CAPSULE BY MOUTH EVERY DAY Patient taking differently: Take 50 mg by mouth daily. 09/05/22  Yes Nafziger, [name], NP  sodium chloride (OCEAN) 0.65 % SOLN nasal spray Place 1 spray into both nostrils as needed for congestion.   Yes [provider]  spironolactone (ALDACTONE) 25 MG tablet Take 25 mg by mouth daily. 05/26/22  Yes [provider]    Scheduled Meds:    amiodarone  200 mg Oral Daily   apixaban  5 mg Oral BID   buPROPion  300 mg Oral Daily   clonazePAM  0.5 mg Oral BID   DULoxetine  60 mg Oral Daily   loratadine  10 mg Oral Daily   metoprolol tartrate  100 mg Oral BID   pantoprazole  40 mg Oral BID   pregabalin  50 mg Oral Daily   Infusions:  PRN Meds: acetaminophen **OR** acetaminophen, albuterol, bismuth subsalicylate, senna-docusate   Allergies as of 09/22/2022 - Review Complete 09/22/2022  Allergen Reaction Noted   Tape Other (See Comments) 02/22/2016   Tramadol Other (See Comments) 12/01/2020   Morphine Itching and Rash 08/01/2008    Family History  Problem Relation Age of Onset   Kidney disease Mother    Hypertension Mother    Heart disease Father 67       die of MI at age 67   Heart disease Brother    Heart attack Brother    Suicidality Son    Other Son        MVA   Colon cancer Neg Hx    Esophageal cancer Neg Hx    Pancreatic cancer Neg Hx    Rectal cancer Neg Hx    Stomach cancer Neg Hx     Social History   Socioeconomic History   Marital status: Widowed    Spouse name: Not on file   Number of children: 2   Years of education: 12   Highest education level: 12th grade  Occupational History   Occupation: retired    Employer: RETIRED  Tobacco Use   Smoking status: Never   Smokeless tobacco: Never  Vaping Use   Vaping Use: Never used  Substance and Sexual Activity   Alcohol use: Never   Drug use: Never   Sexual activity: Not  Currently    Comment: 1st intercourse 20 yo-1 partner  Other Topics Concern   Not on file  Social History Narrative   Retired    Widowed   Right handed   Social Determinants of Health   Financial Resource Strain: Low Risk  (02/14/2022)   Overall Financial Resource Strain (CARDIA)    Difficulty of Paying Living Expenses: Not hard at all  Food Insecurity: No Food Insecurity (09/23/2022)   Hunger Vital Sign    Worried About Running Out of Food in the Last Year: Never true    Ran Out of Food in the Last Year: Never true  Transportation Needs: No Transportation Needs (09/23/2022)   PRAPARE - Transportation    Lack of Transportation (Medical): No    Lack of Transportation (Non-Medical): No  Physical Activity: Inactive (02/14/2022)   Exercise Vital Sign    Days of Exercise per Week: 0 days    Minutes of Exercise per Session: 0 min  Stress: No Stress Concern Present (02/14/2022)   [employer] - Occupational Stress Questionnaire    Feeling of Stress : Not at all  Social Connections: Moderately Integrated (02/14/2022)   Social Connection and Isolation Panel [NHANES]    Frequency of Communication with Friends and Family: More than three times a week    Frequency of Social Gatherings with Friends and Family: More than three times a week    Attends Religious Services: More than 4 times per year    Active Member of Clubs or Organizations: Yes    Attends Club or Organization Meetings: More than 4 times per year    Marital   Status: Widowed  Intimate Partner Violence: Not At Risk (09/23/2022)   Humiliation, Afraid, Rape, and Kick questionnaire    Fear of Current or Ex-Partner: No    Emotionally Abused: No    Physically Abused: No    Sexually Abused: No    REVIEW OF SYSTEMS: Constitutional: Fatigue present when she came in has improved. ENT:  No nose bleeds Pulm: No cough.  Still has DOE but not resting short of breath CV:  No palpitations, no LE edema.  GU:   No hematuria, no frequency GI: See HPI.   Heme: Denies unusual or excessive bleeding or bruising Transfusions: Notes mention previous blood transfusion 1991. Neuro:  No headaches, no peripheral tingling or numbness Derm:  No itching, no rash or sores.  Endocrine:  No sweats or chills.  No polyuria or dysuria Immunization: Reviewed. Travel: Not queried.   PHYSICAL EXAM: Vital signs in last 24 hours: Vitals:   09/25/22 0404 09/25/22 0941  BP: (!) 141/62 119/66  Pulse: 64 65  Resp: 18 16  Temp: (!) 97.5 F (36.4 C) (!) 97.5 F (36.4 C)  SpO2:  99%   Wt Readings from Last 3 Encounters:  09/25/22 66 kg  08/27/22 70.1 kg  08/15/22 68.9 kg    General: Pleasant, well-appearing alert, comfortable. Head: No facial asymmetry or swelling.  No signs of head trauma. Eyes: Sclera anicteric, conjunctiva pink.  EOMI Ears: No hearing deficit Nose: No discharge or congestion Mouth: Excellent native dentition.  Oral mucosa moist, pink, clear.  Tongue midline. Neck: No JVD, no masses, no thyromegaly Lungs: Clear bilaterally with excellent breath sounds bilaterally. Heart: Heart sounds distant.  NSR in the 60s on telemetry monitor. Abdomen: Soft without distention or tenderness.  No organomegaly, bruits, hernias.  Bowel sounds active..   Rectal: Deferred Musc/Skeltl: Slight kyphosis.  No swelling, redness.   Extremities: Minor, nonpitting pedal/lower extremity edema. Neurologic: Alert.  Oriented x 3.  Moves all 4 limbs without gross deficit, strength not formally tested.  No tremors. Skin: No telangiectasia, rash, significant bruising. Nodes: No cervical adenopathy Psych: Cooperative, calm, pleasant.  With speech.  Intake/Output from previous day: 11/21 0701 - 11/22 0700 In: 820 [P.O.:820] Out: 500 [Urine:500] Intake/Output this shift: Total I/O In: 200 [P.O.:200] Out: 100 [Urine:100]  LAB RESULTS: Recent Labs    09/23/22 0940 09/24/22 0025 09/25/22 0046  WBC 11.0* 12.4* 10.9*   HGB 10.1* 10.0* 10.0*  HCT 31.5* 32.2* 31.3*  PLT 352 354 364   BMET Lab Results  Component Value Date   NA 135 09/25/2022   NA 137 09/24/2022   NA 137 09/23/2022   K 4.7 09/25/2022   K 4.9 09/24/2022   K 4.3 09/23/2022   CL 100 09/25/2022   CL 102 09/24/2022   CL 100 09/23/2022   CO2 26 09/25/2022   CO2 27 09/24/2022   CO2 26 09/23/2022   GLUCOSE 113 (H) 09/25/2022   GLUCOSE 150 (H) 09/24/2022   GLUCOSE 95 09/23/2022   BUN 20 09/25/2022   BUN 10 09/24/2022   BUN 13 09/23/2022   CREATININE 1.02 (H) 09/25/2022   CREATININE 0.89 09/24/2022   CREATININE 0.94 09/23/2022   CALCIUM 8.7 (L) 09/25/2022   CALCIUM 9.1 09/24/2022   CALCIUM 8.7 (L) 09/23/2022   LFT Recent Labs    09/22/22 1705 09/24/22 0025 09/25/22 0046  PROT 5.8* 5.8* 5.7*  ALBUMIN 3.1* 3.3* 3.0*  AST 17 16 18  ALT 15 14 16  ALKPHOS 64 58 57  BILITOT 0.6 0.3   0.4   PT/INR Lab Results  Component Value Date   INR 1.3 (H) 10/15/2020   INR 0.9 02/06/2009   Hepatitis Panel No results for input(s): "HEPBSAG", "HCVAB", "HEPAIGM", "HEPBIGM" in the last 72 hours. C-Diff No components found for: "CDIFF" Lipase     Component Value Date/Time   LIPASE 25 04/05/2018 1552    Drugs of Abuse     Component Value Date/Time   LABOPIA NONE DETECTED 02/05/2012 0058   COCAINSCRNUR NONE DETECTED 02/05/2012 0058   LABBENZ POSITIVE (A) 02/05/2012 0058   AMPHETMU NONE DETECTED 02/05/2012 0058   THCU NONE DETECTED 02/05/2012 0058   LABBARB NONE DETECTED 02/05/2012 0058     RADIOLOGY STUDIES: DG ESOPHAGUS W SINGLE CM (SOL OR THIN BA)  Result Date: 09/25/2022 CLINICAL DATA:  Esophageal dysmotility. History of Nissen fundoplication in 2017. Long history of esophageal issues with repeated endoscopies for balloon dilation and Botox injection. Now complaining of globus sensation. EXAM: ESOPHAGUS/BARIUM SWALLOW/TABLET STUDY TECHNIQUE: Single contrast examination was performed using thin liquid barium. This exam was  performed by Wendy Blair, PA-C, and was supervised and interpreted by Dr. Allen Grady. FLUOROSCOPY: Radiation Exposure Index (as provided by the fluoroscopic device): 5.5 mGy Kerma COMPARISON:  Esophagram done August 10, 2019 and CT scan done September 23, 2022 FINDINGS: Swallowing: Appears normal. No vestibular penetration or aspiration seen. Pharynx: Unremarkable. Esophagus: Mildly patulous esophagus.  No evidence of a stricture. Esophageal motility: Moderate esophageal dysmotility with contrast stasis and tertiary contractions. Hiatal Hernia: Moderate-sized sliding hiatal hernia. Gastroesophageal reflux: None visualized. Ingested 13 mm barium tablet: Not given Other: Within the proximal stomach just below the diaphragm, there is chronic severe narrowing of the gastric lumen over a length of greater than 2 cm which is similar to the prior esophagram and results in delayed/slowed passage of barium into the distal stomach. IMPRESSION: 1. Chronic severe narrowing of the proximal stomach, presumably at the site of prior fundoplication, which is similar to the 2020 esophagram and delays passage of barium. 2. Esophageal dysmotility with tertiary contractions. 3. Mildly patulous esophagus.  No esophageal stricture identified. Electronically Signed   By: Allen  Grady M.D.   On: 09/25/2022 10:05   CT CORONARY MORPH W/CTA COR W/SCORE W/CA W/CM &/OR WO/CM  Addendum Date: 09/23/2022   ADDENDUM REPORT: 09/23/2022 15:40 CLINICAL DATA:  Chest pain EXAM: Cardiac CTA MEDICATIONS: Sub lingual nitro. 4mg x 2 TECHNIQUE: The patient was scanned on a Siemens 192 slice scanner. Gantry rotation speed was 250 msecs. Collimation was 0.6 mm. A 100 kV prospective scan was triggered in the ascending thoracic aorta at 35-75% of the R-R interval. Average HR during the scan was 60 bpm. The 3D data set was interpreted on a dedicated work station using MPR, MIP and VRT modes. A total of 80cc of contrast was used. FINDINGS: Non-cardiac: See  separate report from [facility]. Pulmonary veins drain normally to the left atrium. No LA appendage thrombus. Calcium Score: 457 Agatston units. Coronary Arteries: Right dominant with no anomalies LM: Calcified plaque distal left main, mild (1-24%) stenosis. LAD system: Ostial calcified plaque, mild (1-24%) stenosis. Mixed plaque proximal LAD, mild (25-49%) stenosis. Circumflex system: No plaque or stenosis. RCA system: Difficult to assess due to artifact. By using multiple phases, suspect only mild (1-24%) stenosis with calcified plaque in proximal and mid RCA. IMPRESSION: 1. Coronary calcium score 457 Agatston units. This places the patient in the 73rd percentile for age and gender, suggesting intermediate risk for future cardiac events. 2. Very difficult images   due to artifact, but suspect no more than mild stenosis in the coronary tree. Dalton Mclean Electronically Signed   By: Dalton  Mclean M.D.   On: 09/23/2022 15:40   Result Date: 09/23/2022 EXAM: OVER-READ INTERPRETATION  CT CHEST The following report is a limited chest CT over-read performed by radiologist Dr. Leah Strickland of [facility] on 09/23/2022. This over-read does not include interpretation of cardiac or coronary anatomy or pathology. The coronary CTA interpretation by the cardiologist is attached. COMPARISON:  None Available. FINDINGS: Vascular: Cardiomegaly. No pericardial effusion. Normal caliber thoracic aorta with moderate atherosclerotic disease. Mediastinum/Nodes: Moderate hiatal hernia. Patulous and fluid-filled esophagus. No pathologically enlarged lymph nodes seen in the chest. Lungs/Pleura: Lungs are clear. No pleural effusion or pneumothorax. Upper Abdomen: Partially visualized lucency under the right hemidiaphragm, concerning for free air. Musculoskeletal: No chest wall mass or suspicious bone lesions identified. IMPRESSION: 1. Air under the right hemidiaphragm which could be due to pneumoperitoneum or  partially visualized bowel loop. Recommend further evaluation with CT or abdominal radiograph depending on level concern clinical concern. 2. Aortic Atherosclerosis (ICD10-I70.0). 3. Moderate hiatal hernia. 4. Patulous and fluid-filled esophagus, findings can be seen in the setting of esophageal dysmotility. Critical Value/emergent results were called by telephone at the time of interpretation on 09/23/2022 at 1:23 pm to provider Sheikh, who verbally acknowledged these results. Electronically Signed: By: Leah  Strickland M.D. On: 09/23/2022 13:27   CT ABDOMEN PELVIS WO CONTRAST  Result Date: 09/23/2022 CLINICAL DATA:  Acute generalized abdominal pain. EXAM: CT ABDOMEN AND PELVIS WITHOUT CONTRAST TECHNIQUE: Multidetector CT imaging of the abdomen and pelvis was performed following the standard protocol without IV contrast. RADIATION DOSE REDUCTION: This exam was performed according to the departmental dose-optimization program which includes automated exposure control, adjustment of the mA and/or kV according to patient size and/or use of iterative reconstruction technique. COMPARISON:  CT of today.  CT of November 22, 2021. FINDINGS: Lower chest: No acute abnormality. Hepatobiliary: No focal liver abnormality is seen. No gallstones, gallbladder wall thickening, or biliary dilatation. Pancreas: Unremarkable. No pancreatic ductal dilatation or surrounding inflammatory changes. Spleen: Normal in size without focal abnormality. Adrenals/Urinary Tract: Adrenal glands are unremarkable. Kidneys are normal, without renal calculi, focal lesion, or hydronephrosis. Bladder is unremarkable. Stomach/Bowel: Moderate size sliding-type hiatal hernia is noted. There is no evidence of bowel obstruction or inflammation. Vascular/Lymphatic: Aortic atherosclerosis. No enlarged abdominal or pelvic lymph nodes. Reproductive: Status post hysterectomy. No adnexal masses. Other: No abdominal wall hernia or abnormality. No abdominopelvic  ascites. No pneumoperitoneum is noted. The abnormality in question on prior exam represented bowel. Musculoskeletal: Status post surgical posterior fusion extending from L2-S1. No acute osseous abnormality is noted. IMPRESSION: Moderate size sliding-type hiatal hernia. There is no evidence of pneumoperitoneum. No acute abnormality seen in the abdomen or pelvis. Aortic Atherosclerosis (ICD10-I70.0). Electronically Signed   By: James  Green Jr M.D.   On: 09/23/2022 15:02      IMPRESSION:   Recurrent dysphagia.  Prior repair hiatal hernia/fundoplication which at latest EGD has slipped.  Previously beneficial results following dilations and Botox injection of esophageal stricture, balloon dilation of fundoplication area narrowing.  Wide-complex tachycardia, reentry tachycardia.  Now on amiodarone and currently NSR with normal rates.  Admitting symptoms of palpitations, weakness, dyspnea and burning chest pain have mostly if not all resolved.  Chronic Eliquis, not on hold, last dose was given this morning.    PLAN:       Per Dr [name].  If/when EGD/dilation/botox injection pursued,   will need Eliquis held for 2 days.  If we hold it starting now, could have EGD on Friday 11/24.      Switching pt to soft diet.      Reducing Protonix to 40 mg per day.     Sarah Gribbin  09/25/2022, 12:11 PM Phone [phone]  I have taken an interval history, thoroughly reviewed the chart and examined the patient. I agree with the Advanced Practitioner's note, impression and recommendations, and have recorded additional findings, impressions and recommendations below. I performed a substantive portion of this encounter (>50% time spent), including a complete performance of the medical decision making.  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  She has had recurrent symptoms over the last month or so.  Here for cardiac issues that have stabilized.  She apparently needs to go to cardiac rehab, so it sounds like she will be here at least a couple more days.  We will make arrangements for an upper endoscopy to the day after tomorrow for probable balloon dilation and Botox injection.  I spoke with Dr. Croitoru, who confirmed she was stable from a cardiovascular standpoint to undergo the endoscopy, and also for me to discontinue the Eliquis, which I have just done.  We will see her on Friday for the endoscopy and only sooner if called for urgent issues.   Tyneshia Stivers L [name] III Office:[phone]     

## 2022-09-25 NOTE — Progress Notes (Signed)
Rounding Note    Patient Name: Julie Rice Date of Encounter: 09/25/2022  [place] Cardiologist: Jenkins Rouge, MD   Subjective   Denies chest pain and no arrhythmia today, but still complains of shortness of breath.   Inpatient Medications    Scheduled Meds:  amiodarone  200 mg Oral Daily   apixaban  5 mg Oral BID   buPROPion  300 mg Oral Daily   clonazePAM  0.5 mg Oral BID   DULoxetine  60 mg Oral Daily   loratadine  10 mg Oral Daily   metoprolol tartrate  100 mg Oral BID   [START ON 09/26/2022] pantoprazole  40 mg Oral Daily   pregabalin  50 mg Oral Daily   Continuous Infusions:  PRN Meds: acetaminophen **OR** acetaminophen, albuterol, bismuth subsalicylate, senna-docusate   Vital Signs    Vitals:   09/24/22 1457 09/24/22 1500 09/25/22 0404 09/25/22 0941  BP: 114/77 114/77 (!) 141/62 119/66  Pulse: 73 81 64 65  Resp: 18 18 18 16   Temp: 97.9 F (36.6 C)  (!) 97.5 F (36.4 C) (!) 97.5 F (36.4 C)  TempSrc: Oral  Oral Oral  SpO2: 95% 95%  99%  Weight:   66 kg   Height:        Intake/Output Summary (Last 24 hours) at 09/25/2022 1355 Last data filed at 09/25/2022 1300 Gross per 24 hour  Intake 660 ml  Output 600 ml  Net 60 ml      09/25/2022    4:04 AM 09/24/2022   12:52 AM 09/23/2022    4:34 PM  Last 3 Weights  Weight (lbs) 145 lb 6.4 oz 144 lb 8 oz 145 lb 4.5 oz  Weight (kg) 65.953 kg 65.545 kg 65.9 kg      Telemetry    Normal sinus rhythm- Personally Reviewed  ECG    No new tracing- Personally Reviewed  Physical Exam  Comfortable lying in bed with head of bed elevated GEN: No acute distress.   Neck: No JVD Cardiac: RRR, no murmurs, rubs, or gallops.  Respiratory: Clear to auscultation bilaterally. GI: Soft, nontender, non-distended  MS: No edema; No deformity. Neuro:  Nonfocal  Psych: Normal affect   Labs    High Sensitivity Troponin:   Recent Labs  Lab 09/22/22 1705 09/22/22 2000 09/23/22 0201   TROPONINIHS 9 11 11      Chemistry Recent Labs  Lab 09/22/22 1705 09/22/22 1719 09/23/22 0201 09/24/22 0025 09/25/22 0046  NA 138   < > 137 137 135  K 3.5   < > 4.3 4.9 4.7  CL 101  --  100 102 100  CO2 26  --  26 27 26   GLUCOSE 102*  --  95 150* 113*  BUN 12  --  13 10 20   CREATININE 0.89  --  0.94 0.89 1.02*  CALCIUM 8.9  --  8.7* 9.1 8.7*  MG  --   --  2.3 2.4 2.4  PROT 5.8*  --   --  5.8* 5.7*  ALBUMIN 3.1*  --   --  3.3* 3.0*  AST 17  --   --  16 18  ALT 15  --   --  14 16  ALKPHOS 64  --   --  58 57  BILITOT 0.6  --   --  0.3 0.4  GFRNONAA >60  --  60* >60 54*  ANIONGAP 11  --  11 8 9    < > = values in  this interval not displayed.    Lipids No results for input(s): "CHOL", "TRIG", "HDL", "LABVLDL", "LDLCALC", "CHOLHDL" in the last 168 hours.  Hematology Recent Labs  Lab 09/23/22 0940 09/24/22 0025 09/25/22 0046  WBC 11.0* 12.4* 10.9*  RBC 3.39* 3.42* 3.42*  HGB 10.1* 10.0* 10.0*  HCT 31.5* 32.2* 31.3*  MCV 92.9 94.2 91.5  MCH 29.8 29.2 29.2  MCHC 32.1 31.1 31.9  RDW 15.3 15.1 14.8  PLT 352 354 364   Thyroid  Recent Labs  Lab 09/24/22 0025  TSH 1.528    BNP Recent Labs  Lab 09/22/22 1730  BNP 438.0*    DDimer  Recent Labs  Lab 09/22/22 1705  DDIMER <0.27     Radiology    DG ESOPHAGUS W SINGLE CM (SOL OR THIN BA)  Result Date: 09/25/2022 CLINICAL DATA:  Esophageal dysmotility. History of Nissen fundoplication in 2017. Long history of esophageal issues with repeated endoscopies for balloon dilation and Botox injection. Now complaining of globus sensation. EXAM: ESOPHAGUS/BARIUM SWALLOW/TABLET STUDY TECHNIQUE: Single contrast examination was performed using thin liquid barium. This exam was performed by Corrin Parker, PA-C, and was supervised and interpreted by Dr. Sebastian Ache. FLUOROSCOPY: Radiation Exposure Index (as provided by the fluoroscopic device): 5.5 mGy Kerma COMPARISON:  Esophagram done August 10, 2019 and CT scan done September 23, 2022 FINDINGS: Swallowing: Appears normal. No vestibular penetration or aspiration seen. Pharynx: Unremarkable. Esophagus: Mildly patulous esophagus.  No evidence of a stricture. Esophageal motility: Moderate esophageal dysmotility with contrast stasis and tertiary contractions. Hiatal Hernia: Moderate-sized sliding hiatal hernia. Gastroesophageal reflux: None visualized. Ingested 13 mm barium tablet: Not given Other: Within the proximal stomach just below the diaphragm, there is chronic severe narrowing of the gastric lumen over a length of greater than 2 cm which is similar to the prior esophagram and results in delayed/slowed passage of barium into the distal stomach. IMPRESSION: 1. Chronic severe narrowing of the proximal stomach, presumably at the site of prior fundoplication, which is similar to the 2020 esophagram and delays passage of barium. 2. Esophageal dysmotility with tertiary contractions. 3. Mildly patulous esophagus.  No esophageal stricture identified. Electronically Signed   By: Sebastian Ache M.D.   On: 09/25/2022 10:05   CT ABDOMEN PELVIS WO CONTRAST  Result Date: 09/23/2022 CLINICAL DATA:  Acute generalized abdominal pain. EXAM: CT ABDOMEN AND PELVIS WITHOUT CONTRAST TECHNIQUE: Multidetector CT imaging of the abdomen and pelvis was performed following the standard protocol without IV contrast. RADIATION DOSE REDUCTION: This exam was performed according to the departmental dose-optimization program which includes automated exposure control, adjustment of the mA and/or kV according to patient size and/or use of iterative reconstruction technique. COMPARISON:  CT of today.  CT of November 22, 2021. FINDINGS: Lower chest: No acute abnormality. Hepatobiliary: No focal liver abnormality is seen. No gallstones, gallbladder wall thickening, or biliary dilatation. Pancreas: Unremarkable. No pancreatic ductal dilatation or surrounding inflammatory changes. Spleen: Normal in size without focal  abnormality. Adrenals/Urinary Tract: Adrenal glands are unremarkable. Kidneys are normal, without renal calculi, focal lesion, or hydronephrosis. Bladder is unremarkable. Stomach/Bowel: Moderate size sliding-type hiatal hernia is noted. There is no evidence of bowel obstruction or inflammation. Vascular/Lymphatic: Aortic atherosclerosis. No enlarged abdominal or pelvic lymph nodes. Reproductive: Status post hysterectomy. No adnexal masses. Other: No abdominal wall hernia or abnormality. No abdominopelvic ascites. No pneumoperitoneum is noted. The abnormality in question on prior exam represented bowel. Musculoskeletal: Status post surgical posterior fusion extending from L2-S1. No acute osseous abnormality is noted. IMPRESSION:  Moderate size sliding-type hiatal hernia. There is no evidence of pneumoperitoneum. No acute abnormality seen in the abdomen or pelvis. Aortic Atherosclerosis (ICD10-I70.0). Electronically Signed   By: Marijo Conception M.D.   On: 09/23/2022 15:02    Cardiac Studies    7 DAY MONITOR: 03/20/2022 Patch wear time was 6 days and 23 hours Predominant rhythm was NSR with average HR 65bpm 14 runs of SVT with longest lasting 2 hours and 3min at HR 128bpm Occasional SVE (1.1%), rare VE (<1%) Patient triggered events correlate with SVT and PVCs No Afib or significant pauses seen   ECHO: 03/15/2022  1. Left ventricular ejection fraction, by estimation, is 50 to 55%. The left ventricle has low normal function. The left ventricle has no regional wall motion abnormalities. There is mild left ventricular hypertrophy. Left ventricular diastolic  parameters are consistent with Grade II diastolic dysfunction  (pseudonormalization). Elevated left atrial pressure.   2. Right ventricular systolic function is normal. The right ventricular size is mildly enlarged. There is moderately elevated pulmonary artery systolic pressure. The estimated right ventricular systolic pressure is 99991111 mmHg.   3.  Left atrial size was severely dilated.   4. Right atrial size was mildly dilated.   5. The mitral valve is degenerative. Mild to moderate mitral valve regurgitation. No evidence of mitral stenosis. Moderate mitral annular calcification.   6. Tricuspid valve regurgitation is moderate.   7. The aortic valve is tricuspid. Aortic valve regurgitation is mild. Aortic valve sclerosis is present, with no evidence of aortic valve stenosis.   8. The inferior vena cava is normal in size with greater than 50% respiratory variability, suggesting right atrial pressure of 3 mmHg.    CARDIAC CATH: 07/26/2019 Right dominant coronary anatomy Widely patent left main 30 to 40% mid LAD Widely patent circumflex Luminal irregularities in a dominant right coronary. Right heart pressures are relatively low.  Pulmonary wedge pressure mean is 2 mmHg. LVEDP 4 mmHg.  Left ventriculography by hand-injection was not helpful.  From images obtained, LV function appears relatively normal.  Patient Profile     86 y.o. 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   Assessment & Plan     SVT/parox Afib: EKG recommended.  This has been started.  Has a follow-up scheduled Dr. Curt Bears in about 2 weeks.  CAD: widespread plaque without obstructive lesions by coronary CT angiography performed during this admission.  The focus is on risk factor modification, in particular keeping her LDL cholesterol less than 70.  Only has chest pain when she has episodes of atrial fibrillation. CHF: Likely  euvolemic without diuretics.  No signs of heart failure on chest x-ray.  BNP was mildly elevated, but substantially lower than in April of this, I suspect this is probably her baseline. PAH: Reported on echo from May [id number], systolic PA pressure estimated 57 mmHg.  Might need right heart catheterization at some point.  For now we will repeat an echocardiogram as an outpatient. Esophageal stricture/GERD: She has had a previous laparoscopic Nissen fundoplication and has had problems with dysphagia and required esophageal dilation several times since then.  At 1 point had a lung abscess, presumably due to  aspiration.  Suspect recurrent silent aspiration may be a cause for her shortness of breath and her hoarseness.  She has seen Dr. Melvyn Novas as an outpatient.     [place] will sign off.   Medication Recommendations:   Started amiodarone 200 mg daily Other medications unchanged Other recommendations (labs, testing, etc): Schedule echo for pulmonary hypertension as an outpatient Follow up as an outpatient: 10/10/2022 with Dr. Curt Bears  For questions or updates, please contact [place] Please consult [email or web address] for contact info under        Signed, Sanda Klein, MD  09/25/2022, 1:55 PM

## 2022-09-25 NOTE — Progress Notes (Signed)
   09/25/22 1500  Mobility  Activity Ambulated with assistance in hallway  Level of Assistance Contact guard assist, steadying assist  Assistive Device Front wheel walker  Distance Ambulated (ft) 180 ft  Activity Response Tolerated well  Mobility Referral Yes  $Mobility charge 1 Mobility   Mobility Specialist Progress Note  Pt was in bed and agreeable. X1 standing break d/t SOB. Returned to bed w/ all needs met and call bell in reach.   Julie Rice Mobility Specialist ]

## 2022-09-25 NOTE — H&P (View-Only) (Signed)
[facility] Consult: 12:11 PM 09/25/2022  LOS: 0 days    Referring Provider:   Dr Elvera Lennox  Primary Care Physician:  Shirline Frees, NP Primary Gastroenterologist:  Dr. [name]     Reason for Consultation: Dysphagia.  History of esophageal stricture.   HPI: Julie Rice is a 86 y.o. female.  PMH hypertension.  RA.  COPD, chronic respiratory failure on nocturnal oxygen.  CHF.  Nonobstructive CAD on catheterization 2020.  SVT, previous ablation and then recurrence.  A-fib.  Echo 2023 with mild LVH, grade 2 diastolic dysfunction, biatrial dilation, elevated left atrial filling pressures and PA pressure 57 mmHg.  TIA.  Fibromyalgia.    Large hiatal hernia, status post fundoplication.  Esophageal strictures.  Post fundoplication developed dysphagia, Dr. [name] attributed to dysmotility.  Esophageal manometry studies were technically limited.  Dr. [name] has performed Botox injection and esophageal dilatations on several occasions which have been helpful.  Aspiration in October 2020 associated with worsening swallowing troubles.   08/12/2019 EGD:  Benign-appearing distal esophageal stricture, 18 mm diameter at GE junction with a small diverticulum just proximal to the stricture.  Stricture treated with balloon dilation and Botox injection.  Biopsies from the area obtained.Narrowing noted at the fundoplication which was treated with balloon dilation.  5 cm HH cw slipped fundoplication.  Several benign appearing gastric polyps in the gastric body were not biopsied. Path: Benign squamous mucosa with surface hyaline keratosis. Negative for dysplasia or malignancy, increased eosinophils.  Findings suggestive of reflux esophagitis. Stains negative for fungal elements.   Presented to ED 4 days ago complaining of burning chest pain,  palpitations.  Chest tightness with activity radiating to arms.. Cardiac CT: Calcium score 457 Agatston units. This places pt in 73rd percentile for age, gender, suggesting intermediate risk for future cardiac events. Cardiology and EP consulted for palpitations and wide complex tachycardia.  Nonischemic cardiac enzymes. EP Dr. Kendall Flack has seen.  Rentry tachycardia on 04/2021 EP study.  Recommending amiodarone.  Currently in NSR. DOE persists but no resting SOB.  Chest discomfort resolved.    Going on 3 months of recurrent dysphagia.  Initially was mostly solid foods.  But now occasional dysphagia with liquids.  She feels food gets stuck at the level of the upper EUS.  Often able to wash this down with liquids but occasionally ends up regurgitating what ever she is trying to swallow.  No nausea.  Good dentition and able to chew her food well.  Compliant UTI, omeprazole 20 mg daily.  Currently receiving Protonix 40 po bid. over the last couple of weeks she has had some mild discomfort in her left upper abdomen/anterior thorax.  09/23/2022 CTAP wo contrast: Moderate sized, sliding HH.  No acute abnormality in abdomen or pelvis. 09/25/22 Barium esophagram:  chronic severe narrowing at the proximal stomach presumably at site of previous fundoplication similar to esophagram of 2020.  Delay in passage of barium tablet.  Esophageal dysmotility and tertiary contractions.  Mildly patulous esophagus.  No esophageal stricture.  Last Eliquis dose 0945 this morning.  Past Medical History:  Diagnosis Date   A-fib (HCC)    Anemia    Anxiety    Bipolar disorder (HCC)    Blood transfusion 1991   autologous pts own blood given    CAD (coronary artery disease)    Cataract    Chest pain    "@ rest, lying down, w/exertion"   CHF (congestive heart failure) (HCC) 07/2020   Chronic back pain    "mostly lower back but I do have upper back pain regularly" (04/06/2018)   COPD (chronic obstructive pulmonary  disease) (HCC)    Depression    Esophageal stricture    Fibromyalgia    GERD (gastroesophageal reflux disease)    Heart murmur    "slight" (04/06/2018)   Hiatal hernia    Hyperlipidemia    Patient denies   Hypertension    Internal hemorrhoids    Lumbago    Mitral regurgitation    Osteoarthritis    Osteoporosis    Pneumonia ~ 01/2018   PONV (postoperative nausea and vomiting)    severe ponv, "in the past" (04/06/2018)   Rectal bleeding    Rheumatoid arthritis (HCC)    Spondylosis    TIA (transient ischemic attack) 2013   Urinary incontinence    wears depends    Past Surgical History:  Procedure Laterality Date   ABDOMINAL HYSTERECTOMY  1982   BACK SURGERY     BALLOON DILATION N/A 09/21/2018   Procedure: BALLOON DILATION;  Surgeon: Hilarie Fredrickson, MD;  Location: [facility] ENDOSCOPY;  Service: Endoscopy;  Laterality: N/A;   BALLOON DILATION N/A 08/12/2019   Procedure: BALLOON DILATION;  Surgeon: Lynann Bologna, MD;  Location: [facility] ENDOSCOPY;  Service: Endoscopy;  Laterality: N/A;   BIOPSY  08/12/2019   Procedure: BIOPSY;  Surgeon: Lynann Bologna, MD;  Location: [facility] ENDOSCOPY;  Service: Endoscopy;;   BLADDER SUSPENSION  1980's   BOTOX INJECTION N/A 09/21/2018   Procedure: BOTOX INJECTION;  Surgeon: Hilarie Fredrickson, MD;  Location: [facility] ENDOSCOPY;  Service: Endoscopy;  Laterality: N/A;   BOTOX INJECTION N/A 08/12/2019   Procedure: BOTOX INJECTION;  Surgeon: Lynann Bologna, MD;  Location: [facility] ENDOSCOPY;  Service: Endoscopy;  Laterality: N/A;   CATARACT EXTRACTION W/ INTRAOCULAR LENS  IMPLANT, BILATERAL Bilateral 2010   DILATION AND CURETTAGE OF UTERUS  1961   ESOPHAGEAL MANOMETRY N/A 03/25/2018   Procedure: ESOPHAGEAL MANOMETRY (EM);  Surgeon: Napoleon Form, MD;  Location: [facility] ENDOSCOPY;  Service: Endoscopy;  Laterality: N/A;   ESOPHAGOGASTRODUODENOSCOPY (EGD) WITH PROPOFOL N/A 04/07/2018   Procedure: ESOPHAGOGASTRODUODENOSCOPY (EGD) WITH PROPOFOL;  Surgeon: Sherrilyn Rist, MD;  Location: [facility]  ENDOSCOPY;  Service: Gastroenterology;  Laterality: N/A;   ESOPHAGOGASTRODUODENOSCOPY (EGD) WITH PROPOFOL N/A 09/21/2018   Procedure: ESOPHAGOGASTRODUODENOSCOPY (EGD) WITH PROPOFOL;  Surgeon: Hilarie Fredrickson, MD;  Location: [facility] ENDOSCOPY;  Service: Endoscopy;  Laterality: N/A;   ESOPHAGOGASTRODUODENOSCOPY (EGD) WITH PROPOFOL N/A 08/12/2019   Procedure: ESOPHAGOGASTRODUODENOSCOPY (EGD) WITH PROPOFOL;  Surgeon: Lynann Bologna, MD;  Location: [facility] ENDOSCOPY;  Service: Endoscopy;  Laterality: N/A;   HERNIA REPAIR     HIATAL HERNIA REPAIR N/A 08/02/2016   Procedure: LAPAROSCOPIC REPAIR OF LARGE  HIATAL HERNIA;  Surgeon: Glenna Fellows, MD;  Location: [facility] ORS;  Service: General;  Laterality: N/A;   JOINT REPLACEMENT     LAPAROSCOPIC NISSEN FUNDOPLICATION N/A 08/02/2016   Procedure: LAPAROSCOPIC NISSEN FUNDOPLICATION;  Surgeon: Glenna Fellows, MD;  Location: [facility] ORS;  Service: General;  Laterality: N/A;   LUMBAR LAMINECTOMY  1990; 1994; [id number]   "I've  got 2 stainless steel rods; 6 screws; 2 ray cages"took bone from right hip to put in back   RIGHT/LEFT HEART CATH AND CORONARY ANGIOGRAPHY N/A 07/26/2019   Procedure: RIGHT/LEFT HEART CATH AND CORONARY ANGIOGRAPHY;  Surgeon: Lyn Records, MD;  Location: [facility] INVASIVE CV LAB;  Service: Cardiovascular;  Laterality: N/A;   SVT ABLATION N/A 04/25/2021   Procedure: SVT ABLATION;  Surgeon: Regan Lemming, MD;  Location: [facility] INVASIVE CV LAB;  Service: Cardiovascular;  Laterality: N/A;   TEE WITHOUT CARDIOVERSION N/A 08/19/2019   Procedure: TRANSESOPHAGEAL ECHOCARDIOGRAM (TEE);  Surgeon: Lewayne Bunting, MD;  Location: [facility] ENDOSCOPY;  Service: Cardiovascular;  Laterality: N/A;   TONSILLECTOMY AND ADENOIDECTOMY  1945   TOTAL KNEE ARTHROPLASTY Right ~ 1996   TUBAL LIGATION  ~ 1976    Prior to Admission medications   Medication Sig Start Date End Date Taking? Authorizing Provider  acetaminophen (TYLENOL) 500 MG tablet Take 1,000 mg by mouth every 6 (six)  hours as needed for moderate pain, headache or fever.   Yes [provider]  albuterol (VENTOLIN HFA) 108 (90 Base) MCG/ACT inhaler TAKE 2 PUFFS BY MOUTH EVERY 6 HOURS AS NEEDED FOR WHEEZE OR SHORTNESS OF BREATH Patient taking differently: Inhale 2 puffs into the lungs every 6 (six) hours as needed for wheezing or shortness of breath. 08/06/22  Yes Nafziger, [name], NP  buPROPion (WELLBUTRIN XL) 300 MG 24 hr tablet TAKE 1 TABLET BY MOUTH EVERY DAY 08/06/22  Yes Nafziger, [name], NP  Carboxymethylcellulose Sodium (REFRESH TEARS OP) Place 1 drop into both eyes 2 (two) times daily.   Yes [provider]  cetirizine (ZYRTEC) 10 MG tablet Take 10 mg by mouth daily as needed for allergies. 02/20/21  Yes [provider]  clonazePAM (KLONOPIN) 0.5 MG tablet Take 0.5 tablets (0.25 mg total) by mouth 2 (two) times daily as needed for anxiety. 09/11/22 10/11/22 Yes Nafziger, [name], NP  DULoxetine (CYMBALTA) 60 MG capsule TAKE 1 CAPSULE BY MOUTH EVERY DAY Patient taking differently: Take 60 mg by mouth daily. 11/30/21  Yes Lovorn, Megan, MD  ELIQUIS 5 MG TABS tablet TAKE 1 TABLET BY MOUTH TWICE A DAY Patient taking differently: Take 5 mg by mouth 2 (two) times daily. 05/08/22  Yes Camnitz, Will Daphine Deutscher, MD  FeFum-FePoly-FA-B Cmp-C-Biot (INTEGRA PLUS) CAPS Take 1 capsule by mouth every morning. 08/30/22  Yes Heilingoetter, Cassandra L, PA-C  KLOR-CON M20 20 MEQ tablet TAKE 1 TABLET BY MOUTH EVERY DAY 08/06/22  Yes Weaver, Scott T, PA-C  losartan (COZAAR) 50 MG tablet TAKE 1 TABLET BY MOUTH EVERY DAY 07/18/22  Yes Camnitz, Andree Coss, MD  metoprolol tartrate (LOPRESSOR) 50 MG tablet Take 1.5 tablets (75 mg total) by mouth 2 (two) times daily. 07/18/22  Yes Camnitz, Will Daphine Deutscher, MD  Multiple Vitamins-Minerals (ONE-A-DAY WOMENS 50+) TABS Take 1 tablet by mouth daily.   Yes [provider]  omeprazole (PRILOSEC) 20 MG capsule TAKE 1 CAPSULE BY MOUTH EVERY DAY Patient taking differently: Take 20  mg by mouth daily. 08/28/22  Yes Nafziger, [name], NP  pregabalin (LYRICA) 50 MG capsule TAKE 1 CAPSULE BY MOUTH EVERY DAY Patient taking differently: Take 50 mg by mouth daily. 09/05/22  Yes Nafziger, [name], NP  sodium chloride (OCEAN) 0.65 % SOLN nasal spray Place 1 spray into both nostrils as needed for congestion.   Yes [provider]  spironolactone (ALDACTONE) 25 MG tablet Take 25 mg by mouth daily. 05/26/22  Yes [provider]    Scheduled Meds:  amiodarone  200 mg Oral Daily   apixaban  5 mg Oral BID   buPROPion  300 mg Oral Daily   clonazePAM  0.5 mg Oral BID   DULoxetine  60 mg Oral Daily   loratadine  10 mg Oral Daily   metoprolol tartrate  100 mg Oral BID   pantoprazole  40 mg Oral BID   pregabalin  50 mg Oral Daily   Infusions:  PRN Meds: acetaminophen **OR** acetaminophen, albuterol, bismuth subsalicylate, senna-docusate   Allergies as of 09/22/2022 - Review Complete 09/22/2022  Allergen Reaction Noted   Tape Other (See Comments) 02/22/2016   Tramadol Other (See Comments) 12/01/2020   Morphine Itching and Rash 08/01/2008    Family History  Problem Relation Age of Onset   Kidney disease Mother    Hypertension Mother    Heart disease Father 13       die of MI at age 8   Heart disease Brother    Heart attack Brother    Suicidality Son    Other Son        MVA   Colon cancer Neg Hx    Esophageal cancer Neg Hx    Pancreatic cancer Neg Hx    Rectal cancer Neg Hx    Stomach cancer Neg Hx     Social History   Socioeconomic History   Marital status: Widowed    Spouse name: Not on file   Number of children: 2   Years of education: 30   Highest education level: 12th grade  Occupational History   Occupation: retired    Associate Professor: RETIRED  Tobacco Use   Smoking status: Never   Smokeless tobacco: Never  Vaping Use   Vaping Use: Never used  Substance and Sexual Activity   Alcohol use: Never   Drug use: Never   Sexual activity: Not  Currently    Comment: 1st intercourse 67 yo-1 partner  Other Topics Concern   Not on file  Social History Narrative   Retired    Widowed   Right handed   Social Determinants of Health   Financial Resource Strain: Low Risk  (02/14/2022)   Overall Financial Resource Strain (CARDIA)    Difficulty of Paying Living Expenses: Not hard at all  Food Insecurity: No Food Insecurity (09/23/2022)   Hunger Vital Sign    Worried About Running Out of Food in the Last Year: Never true    Ran Out of Food in the Last Year: Never true  Transportation Needs: No Transportation Needs (09/23/2022)   PRAPARE - Administrator, Civil Service (Medical): No    Lack of Transportation (Non-Medical): No  Physical Activity: Inactive (02/14/2022)   Exercise Vital Sign    Days of Exercise per Week: 0 days    Minutes of Exercise per Session: 0 min  Stress: No Stress Concern Present (02/14/2022)   [employer] of Occupational Health - Occupational Stress Questionnaire    Feeling of Stress : Not at all  Social Connections: Moderately Integrated (02/14/2022)   Social Connection and Isolation Panel [NHANES]    Frequency of Communication with Friends and Family: More than three times a week    Frequency of Social Gatherings with Friends and Family: More than three times a week    Attends Religious Services: More than 4 times per year    Active Member of [employer] or Organizations: Yes    Attends Banker Meetings: More than 4 times per year    Marital  Status: Widowed  Intimate Partner Violence: Not At Risk (09/23/2022)   Humiliation, Afraid, Rape, and Kick questionnaire    Fear of Current or Ex-Partner: No    Emotionally Abused: No    Physically Abused: No    Sexually Abused: No    REVIEW OF SYSTEMS: Constitutional: Fatigue present when she came in has improved. ENT:  No nose bleeds Pulm: No cough.  Still has DOE but not resting short of breath CV:  No palpitations, no LE edema.  GU:   No hematuria, no frequency GI: See HPI.   Heme: Denies unusual or excessive bleeding or bruising Transfusions: Notes mention previous blood transfusion 1991. Neuro:  No headaches, no peripheral tingling or numbness Derm:  No itching, no rash or sores.  Endocrine:  No sweats or chills.  No polyuria or dysuria Immunization: Reviewed. Travel: Not queried.   PHYSICAL EXAM: Vital signs in last 24 hours: Vitals:   09/25/22 0404 09/25/22 0941  BP: (!) 141/62 119/66  Pulse: 64 65  Resp: 18 16  Temp: (!) 97.5 F (36.4 C) (!) 97.5 F (36.4 C)  SpO2:  99%   Wt Readings from Last 3 Encounters:  09/25/22 66 kg  08/27/22 70.1 kg  08/15/22 68.9 kg    General: Pleasant, well-appearing alert, comfortable. Head: No facial asymmetry or swelling.  No signs of head trauma. Eyes: Sclera anicteric, conjunctiva pink.  EOMI Ears: No hearing deficit Nose: No discharge or congestion Mouth: Excellent native dentition.  Oral mucosa moist, pink, clear.  Tongue midline. Neck: No JVD, no masses, no thyromegaly Lungs: Clear bilaterally with excellent breath sounds bilaterally. Heart: Heart sounds distant.  NSR in the 60s on telemetry monitor. Abdomen: Soft without distention or tenderness.  No organomegaly, bruits, hernias.  Bowel sounds active..   Rectal: Deferred Musc/Skeltl: Slight kyphosis.  No swelling, redness.   Extremities: Minor, nonpitting pedal/lower extremity edema. Neurologic: Alert.  Oriented x 3.  Moves all 4 limbs without gross deficit, strength not formally tested.  No tremors. Skin: No telangiectasia, rash, significant bruising. Nodes: No cervical adenopathy Psych: Cooperative, calm, pleasant.  With speech.  Intake/Output from previous day: 11/21 0701 - 11/22 0700 In: 820 [P.O.:820] Out: 500 [Urine:500] Intake/Output this shift: Total I/O In: 200 [P.O.:200] Out: 100 [Urine:100]  LAB RESULTS: Recent Labs    09/23/22 0940 09/24/22 0025 09/25/22 0046  WBC 11.0* 12.4* 10.9*   HGB 10.1* 10.0* 10.0*  HCT 31.5* 32.2* 31.3*  PLT 352 354 364   BMET Lab Results  Component Value Date   NA 135 09/25/2022   NA 137 09/24/2022   NA 137 09/23/2022   K 4.7 09/25/2022   K 4.9 09/24/2022   K 4.3 09/23/2022   CL 100 09/25/2022   CL 102 09/24/2022   CL 100 09/23/2022   CO2 26 09/25/2022   CO2 27 09/24/2022   CO2 26 09/23/2022   GLUCOSE 113 (H) 09/25/2022   GLUCOSE 150 (H) 09/24/2022   GLUCOSE 95 09/23/2022   BUN 20 09/25/2022   BUN 10 09/24/2022   BUN 13 09/23/2022   CREATININE 1.02 (H) 09/25/2022   CREATININE 0.89 09/24/2022   CREATININE 0.94 09/23/2022   CALCIUM 8.7 (L) 09/25/2022   CALCIUM 9.1 09/24/2022   CALCIUM 8.7 (L) 09/23/2022   LFT Recent Labs    09/22/22 1705 09/24/22 0025 09/25/22 0046  PROT 5.8* 5.8* 5.7*  ALBUMIN 3.1* 3.3* 3.0*  AST 17 16 18   ALT 15 14 16   ALKPHOS 64 58 57  BILITOT 0.6 0.3  0.4   PT/INR Lab Results  Component Value Date   INR 1.3 (H) 10/15/2020   INR 0.9 02/06/2009   Hepatitis Panel No results for input(s): "HEPBSAG", "HCVAB", "HEPAIGM", "HEPBIGM" in the last 72 hours. C-Diff No components found for: "CDIFF" Lipase     Component Value Date/Time   LIPASE 25 04/05/2018 1552    Drugs of Abuse     Component Value Date/Time   LABOPIA NONE DETECTED 02/05/2012 0058   COCAINSCRNUR NONE DETECTED 02/05/2012 0058   LABBENZ POSITIVE (A) 02/05/2012 0058   AMPHETMU NONE DETECTED 02/05/2012 0058   THCU NONE DETECTED 02/05/2012 0058   LABBARB NONE DETECTED 02/05/2012 0058     RADIOLOGY STUDIES: DG ESOPHAGUS W SINGLE CM (SOL OR THIN BA)  Result Date: 09/25/2022 CLINICAL DATA:  Esophageal dysmotility. History of Nissen fundoplication in 2017. Long history of esophageal issues with repeated endoscopies for balloon dilation and Botox injection. Now complaining of globus sensation. EXAM: ESOPHAGUS/BARIUM SWALLOW/TABLET STUDY TECHNIQUE: Single contrast examination was performed using thin liquid barium. This exam was  performed by Corrin Parker, PA-C, and was supervised and interpreted by Dr. Sebastian Ache. FLUOROSCOPY: Radiation Exposure Index (as provided by the fluoroscopic device): 5.5 mGy Kerma COMPARISON:  Esophagram done August 10, 2019 and CT scan done September 23, 2022 FINDINGS: Swallowing: Appears normal. No vestibular penetration or aspiration seen. Pharynx: Unremarkable. Esophagus: Mildly patulous esophagus.  No evidence of a stricture. Esophageal motility: Moderate esophageal dysmotility with contrast stasis and tertiary contractions. Hiatal Hernia: Moderate-sized sliding hiatal hernia. Gastroesophageal reflux: None visualized. Ingested 13 mm barium tablet: Not given Other: Within the proximal stomach just below the diaphragm, there is chronic severe narrowing of the gastric lumen over a length of greater than 2 cm which is similar to the prior esophagram and results in delayed/slowed passage of barium into the distal stomach. IMPRESSION: 1. Chronic severe narrowing of the proximal stomach, presumably at the site of prior fundoplication, which is similar to the 2020 esophagram and delays passage of barium. 2. Esophageal dysmotility with tertiary contractions. 3. Mildly patulous esophagus.  No esophageal stricture identified. Electronically Signed   By: Sebastian Ache M.D.   On: 09/25/2022 10:05   CT CORONARY MORPH W/CTA COR W/SCORE W/CA W/CM &/OR WO/CM  Addendum Date: 09/23/2022   ADDENDUM REPORT: 09/23/2022 15:40 CLINICAL DATA:  Chest pain EXAM: Cardiac CTA MEDICATIONS: Sub lingual nitro.  x 2 TECHNIQUE: The patient was scanned on a Siemens 192 slice scanner. Gantry rotation speed was 250 msecs. Collimation was 0.6 mm. A 100 kV prospective scan was triggered in the ascending thoracic aorta at 35-75% of the R-R interval. Average HR during the scan was 60 bpm. The 3D data set was interpreted on a dedicated work station using MPR, MIP and VRT modes. A total of 80cc of contrast was used. FINDINGS: Non-cardiac: See  separate report from [facility]. Pulmonary veins drain normally to the left atrium. No LA appendage thrombus. Calcium Score: 457 Agatston units. Coronary Arteries: Right dominant with no anomalies LM: Calcified plaque distal left main, mild (1-24%) stenosis. LAD system: Ostial calcified plaque, mild (1-24%) stenosis. Mixed plaque proximal LAD, mild (25-49%) stenosis. Circumflex system: No plaque or stenosis. RCA system: Difficult to assess due to artifact. By using multiple phases, suspect only mild (1-24%) stenosis with calcified plaque in proximal and mid RCA. IMPRESSION: 1. Coronary calcium score 457 Agatston units. This places the patient in the 73rd percentile for age and gender, suggesting intermediate risk for future cardiac events. 2. Very difficult images  due to artifact, but suspect no more than mild stenosis in the coronary tree. Dalton Mclean Electronically Signed   By: Marca Ancona M.D.   On: 09/23/2022 15:40   Result Date: 09/23/2022 EXAM: OVER-READ INTERPRETATION  CT CHEST The following report is a limited chest CT over-read performed by radiologist Dr. Allegra Lai of [facility] on 09/23/2022. This over-read does not include interpretation of cardiac or coronary anatomy or pathology. The coronary CTA interpretation by the cardiologist is attached. COMPARISON:  None Available. FINDINGS: Vascular: Cardiomegaly. No pericardial effusion. Normal caliber thoracic aorta with moderate atherosclerotic disease. Mediastinum/Nodes: Moderate hiatal hernia. Patulous and fluid-filled esophagus. No pathologically enlarged lymph nodes seen in the chest. Lungs/Pleura: Lungs are clear. No pleural effusion or pneumothorax. Upper Abdomen: Partially visualized lucency under the right hemidiaphragm, concerning for free air. Musculoskeletal: No chest wall mass or suspicious bone lesions identified. IMPRESSION: 1. Air under the right hemidiaphragm which could be due to pneumoperitoneum or  partially visualized bowel loop. Recommend further evaluation with CT or abdominal radiograph depending on level concern clinical concern. 2. Aortic Atherosclerosis (ICD10-I70.0). 3. Moderate hiatal hernia. 4. Patulous and fluid-filled esophagus, findings can be seen in the setting of esophageal dysmotility. Critical Value/emergent results were called by telephone at the time of interpretation on 09/23/2022 at 1:23 pm to provider [facility], who verbally acknowledged these results. Electronically Signed: By: Allegra Lai M.D. On: 09/23/2022 13:27   CT ABDOMEN PELVIS WO CONTRAST  Result Date: 09/23/2022 CLINICAL DATA:  Acute generalized abdominal pain. EXAM: CT ABDOMEN AND PELVIS WITHOUT CONTRAST TECHNIQUE: Multidetector CT imaging of the abdomen and pelvis was performed following the standard protocol without IV contrast. RADIATION DOSE REDUCTION: This exam was performed according to the departmental dose-optimization program which includes automated exposure control, adjustment of the mA and/or kV according to patient size and/or use of iterative reconstruction technique. COMPARISON:  CT of today.  CT of November 22, 2021. FINDINGS: Lower chest: No acute abnormality. Hepatobiliary: No focal liver abnormality is seen. No gallstones, gallbladder wall thickening, or biliary dilatation. Pancreas: Unremarkable. No pancreatic ductal dilatation or surrounding inflammatory changes. Spleen: Normal in size without focal abnormality. Adrenals/Urinary Tract: Adrenal glands are unremarkable. Kidneys are normal, without renal calculi, focal lesion, or hydronephrosis. Bladder is unremarkable. Stomach/Bowel: Moderate size sliding-type hiatal hernia is noted. There is no evidence of bowel obstruction or inflammation. Vascular/Lymphatic: Aortic atherosclerosis. No enlarged abdominal or pelvic lymph nodes. Reproductive: Status post hysterectomy. No adnexal masses. Other: No abdominal wall hernia or abnormality. No abdominopelvic  ascites. No pneumoperitoneum is noted. The abnormality in question on prior exam represented bowel. Musculoskeletal: Status post surgical posterior fusion extending from L2-S1. No acute osseous abnormality is noted. IMPRESSION: Moderate size sliding-type hiatal hernia. There is no evidence of pneumoperitoneum. No acute abnormality seen in the abdomen or pelvis. Aortic Atherosclerosis (ICD10-I70.0). Electronically Signed   By: Lupita Raider M.D.   On: 09/23/2022 15:02      IMPRESSION:   Recurrent dysphagia.  Prior repair hiatal hernia/fundoplication which at latest EGD has slipped.  Previously beneficial results following dilations and Botox injection of esophageal stricture, balloon dilation of fundoplication area narrowing.  Wide-complex tachycardia, reentry tachycardia.  Now on amiodarone and currently NSR with normal rates.  Admitting symptoms of palpitations, weakness, dyspnea and burning chest pain have mostly if not all resolved.  Chronic Eliquis, not on hold, last dose was given this morning.    PLAN:       Per Dr Myrtie Neither.  If/when EGD/dilation/botox injection pursued,  will need Eliquis held for 2 days.  If we hold it starting now, could have EGD on Friday 11/24.      Switching pt to soft diet.      Reducing Protonix to 40 mg per day.     Jennye Moccasin  09/25/2022, 12:11 PM Phone [phone]  I have taken an interval history, thoroughly reviewed the chart and examined the patient. I agree with the Advanced Practitioner's note, impression and recommendations, and have recorded additional findings, impressions and recommendations below. I performed a substantive portion of this encounter (>50% time spent), including a complete performance of the medical decision making.  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  She has had recurrent symptoms over the last month or so.  Here for cardiac issues that have stabilized.  She apparently needs to go to cardiac rehab, so it sounds like she will be here at least a couple more days.  We will make arrangements for an upper endoscopy to the day after tomorrow for probable balloon dilation and Botox injection.  I spoke with Dr. Royann Shivers, who confirmed she was stable from a cardiovascular standpoint to undergo the endoscopy, and also for me to discontinue the Eliquis, which I have just done.  We will see her on Friday for the endoscopy and only sooner if called for urgent issues.   [name] [phone]

## 2022-09-25 NOTE — Progress Notes (Signed)
PROGRESS NOTE  Julie Rice XBM:[id number] DOB: 1936/10/22 DOA: 09/22/2022 PCP: Shirline Frees, NP   LOS: 0 days   Brief Narrative / Interim history: 86 year old with HTN, RA, chronic diastolic CHF, COPD, chronic respiratory failure at home on 2L at home, esophageal strictures, PAF, CAD who comes to the hospital with complaints of chest burning and palpitations.  On telemetry she was found to have wide-complex tachycardia and cardiology was consulted.  Subjective / 24h Interval events: She is doing well this morning.  Denies any chest pain.  Complains of intermittent dysphagia which is progressively getting worse over the last 2 months.  Assesement and Plan: Principal Problem:   Arrhythmia Active Problems:   Major depressive disorder, recurrent (HCC)   Generalized anxiety disorder   Hypothyroidism   Dyslipidemia   Essential hypertension   ESOPHAGEAL STRICTURE   Esophageal stricture   (HFpEF) heart failure with preserved ejection fraction (HCC)   Prolonged QT interval   Chronic respiratory failure with hypoxia (HCC)   COPD (chronic obstructive pulmonary disease) (HCC)   PAF (paroxysmal atrial fibrillation) (HCC)   Chronic pain syndrome   Myofascial pain   Rheumatoid arthritis involving multiple sites (HCC)   Paroxysmal SVT (supraventricular tachycardia)   Idiopathic peripheral neuropathy  Principal problem SVT / PAF - with wide complex tachycardia.  Cardiology/EP consulted, evaluated patient and she was now started on amiodarone, continue along with metoprolol.  Rates much better controlled -Continue Eliquis, if GI will pursue an EGD this will need to be held  Active problems Dysphagia, chronic esophageal strictures-she has been having progressive symptoms, GI consulted, appreciate input.  Chronic diastolic CHF-appears euvolemic  HTN-continue metoprolol, blood pressure stable.  Spironolactone and losartan are on hold  GAD-continue Wellbutrin, Klonopin,  duloxetine  COPD with chronic hypoxic respiratory failure-wears oxygen at night only.  Sees Dr. Sherene Sires as an outpatient  Leukocytosis-white count stable  Mild normocytic anemia-of chronic disease   Acquired Scheduled Meds:  amiodarone  200 mg Oral Daily   apixaban  5 mg Oral BID   buPROPion  300 mg Oral Daily   clonazePAM  0.5 mg Oral BID   DULoxetine  60 mg Oral Daily   loratadine  10 mg Oral Daily   metoprolol tartrate  100 mg Oral BID   [START ON 09/26/2022] pantoprazole  40 mg Oral Daily   pregabalin  50 mg Oral Daily   Continuous Infusions: PRN Meds:.acetaminophen **OR** acetaminophen, albuterol, bismuth subsalicylate, senna-docusate  Current Outpatient Medications  Medication Instructions   acetaminophen (TYLENOL) 1,000 mg, Oral, Every 6 hours PRN   albuterol (VENTOLIN HFA) 108 (90 Base) MCG/ACT inhaler TAKE 2 PUFFS BY MOUTH EVERY 6 HOURS AS NEEDED FOR WHEEZE OR SHORTNESS OF BREATH   buPROPion (WELLBUTRIN XL) 300 mg, Oral, Daily   Carboxymethylcellulose Sodium (REFRESH TEARS OP) 1 drop, Both Eyes, 2 times daily   cetirizine (ZYRTEC) 10 mg, Oral, Daily PRN   clonazePAM (KLONOPIN) 0.25 mg, Oral, 2 times daily PRN   DULoxetine (CYMBALTA) 60 MG capsule TAKE 1 CAPSULE BY MOUTH EVERY DAY   ELIQUIS 5 MG TABS tablet TAKE 1 TABLET BY MOUTH TWICE A DAY   FeFum-FePoly-FA-B Cmp-C-Biot (INTEGRA PLUS) CAPS 1 capsule, Oral, Every morning   KLOR-CON M20 20 MEQ tablet 20 mEq, Oral, Daily   losartan (COZAAR) 50 mg, Oral, Daily   metoprolol tartrate (LOPRESSOR) 75 mg, Oral, 2 times daily   Multiple Vitamins-Minerals (ONE-A-DAY WOMENS 50+) TABS 1 tablet, Oral, Daily   omeprazole (PRILOSEC) 20 MG capsule TAKE 1 CAPSULE BY  MOUTH EVERY DAY   pregabalin (LYRICA) 50 MG capsule Oral, Daily   sodium chloride (OCEAN) 0.65 % SOLN nasal spray 1 spray, Each Nare, As needed   spironolactone (ALDACTONE) 25 mg, Oral, Daily    Diet Orders (From admission, onward)     Start     Ordered   09/25/22  1345  DIET SOFT Room service appropriate? Yes; Fluid consistency: Thin  Diet effective now       Question Answer Comment  Room service appropriate? Yes   Fluid consistency: Thin      09/25/22 1344            DVT prophylaxis: SCDs Start: 09/22/22 2210 apixaban (ELIQUIS) tablet 5 mg   Lab Results  Component Value Date   PLT 364 09/25/2022      Code Status: Full Code  Family Communication: no family at bedside   Status is: Observation The patient will require care spanning > 2 midnights and should be moved to inpatient because: Persistent GI symptoms, evaluation ongoing.  She also needs SNF   Level of care: Telemetry Medical  Consultants:  GI Cardiology   Objective: Vitals:   09/24/22 1457 09/24/22 1500 09/25/22 0404 09/25/22 0941  BP: 114/77 114/77 (!) 141/62 119/66  Pulse: 73 81 64 65  Resp: 18 18 18 16   Temp: 97.9 F (36.6 C)  (!) 97.5 F (36.4 C) (!) 97.5 F (36.4 C)  TempSrc: Oral  Oral Oral  SpO2: 95% 95%  99%  Weight:   66 kg   Height:        Intake/Output Summary (Last 24 hours) at 09/25/2022 1424 Last data filed at 09/25/2022 1300 Gross per 24 hour  Intake 420 ml  Output 600 ml  Net -180 ml   Wt Readings from Last 3 Encounters:  09/25/22 66 kg  08/27/22 70.1 kg  08/15/22 68.9 kg    Examination:  Constitutional: NAD Eyes: no scleral icterus ENMT: Mucous membranes are moist.  Neck: normal, supple Respiratory: clear to auscultation bilaterally, no wheezing, no crackles. Normal respiratory effort. No accessory muscle use.  Cardiovascular: Regular rate and rhythm, no murmurs / rubs / gallops. No LE edema.  Abdomen: non distended, no tenderness. Bowel sounds positive.  Musculoskeletal: no clubbing / cyanosis.    Data Reviewed: I have independently reviewed following labs and imaging studies   CBC Recent Labs  Lab 09/22/22 1705 09/22/22 1719 09/23/22 0940 09/24/22 0025 09/25/22 0046  WBC 12.5*  --  11.0* 12.4* 10.9*  HGB 9.8* 10.2*  10.1* 10.0* 10.0*  HCT 30.5* 30.0* 31.5* 32.2* 31.3*  PLT 339  --  352 354 364  MCV 93.0  --  92.9 94.2 91.5  MCH 29.9  --  29.8 29.2 29.2  MCHC 32.1  --  32.1 31.1 31.9  RDW 15.0  --  15.3 15.1 14.8  LYMPHSABS 1.3  --  1.1 1.2 1.6  MONOABS 0.9  --  0.9 1.1* 1.1*  EOSABS 0.1  --  0.2 0.3 0.3  BASOSABS 0.1  --  0.1 0.1 0.1    Recent Labs  Lab 09/22/22 1705 09/22/22 1719 09/22/22 1730 09/23/22 0201 09/24/22 0025 09/25/22 0046  NA 138 137  --  137 137 135  K 3.5 3.5  --  4.3 4.9 4.7  CL 101  --   --  100 102 100  CO2 26  --   --  26 27 26   GLUCOSE 102*  --   --  95 150* 113*  BUN  12  --   --  13 10 20   CREATININE 0.89  --   --  0.94 0.89 1.02*  CALCIUM 8.9  --   --  8.7* 9.1 8.7*  AST 17  --   --   --  16 18  ALT 15  --   --   --  14 16  ALKPHOS 64  --   --   --  58 57  BILITOT 0.6  --   --   --  0.3 0.4  ALBUMIN 3.1*  --   --   --  3.3* 3.0*  MG  --   --   --  2.3 2.4 2.4  DDIMER <0.27  --   --   --   --   --   TSH  --   --   --   --  1.528  --   BNP  --   --  438.0*  --   --   --     ------------------------------------------------------------------------------------------------------------------ No results for input(s): "CHOL", "HDL", "LDLCALC", "TRIG", "CHOLHDL", "LDLDIRECT" in the last 72 hours.  Lab Results  Component Value Date   HGBA1C 5.7 07/31/2022   ------------------------------------------------------------------------------------------------------------------ Recent Labs    09/24/22 0025  TSH 1.528    Cardiac Enzymes No results for input(s): "CKMB", "TROPONINI", "MYOGLOBIN" in the last 168 hours.  Invalid input(s): "CK" ------------------------------------------------------------------------------------------------------------------    Component Value Date/Time   BNP 438.0 (H) 09/22/2022 1730   BNP 89 02/12/2021 1506    CBG: No results for input(s): "GLUCAP" in the last 168 hours.  Recent Results (from the past 240 hour(s))  Resp Panel  by RT-PCR (Flu A&B, Covid) Anterior Nasal Swab     Status: None   Collection Time: 09/22/22  5:28 PM   Specimen: Anterior Nasal Swab  Result Value Ref Range Status   SARS Coronavirus 2 by RT PCR NEGATIVE NEGATIVE Final    Comment: (NOTE) SARS-CoV-2 target nucleic acids are NOT DETECTED.  The SARS-CoV-2 RNA is generally detectable in upper respiratory specimens during the acute phase of infection. The lowest concentration of SARS-CoV-2 viral copies this assay can detect is 138 copies/mL. A negative result does not preclude SARS-Cov-2 infection and should not be used as the sole basis for treatment or other patient management decisions. A negative result may occur with  improper specimen collection/handling, submission of specimen other than nasopharyngeal swab, presence of viral mutation(s) within the areas targeted by this assay, and inadequate number of viral copies(<138 copies/mL). A negative result must be combined with clinical observations, patient history, and epidemiological information. The expected result is Negative.  Fact Sheet for Patients:  09/24/22  Fact Sheet for Healthcare Providers:  BloggerCourse.com  This test is no t yet approved or cleared by the [employer] FDA and  has been authorized for detection and/or diagnosis of SARS-CoV-2 by FDA under an Emergency Use Authorization (EUA). This EUA will remain  in effect (meaning this test can be used) for the duration of the COVID-19 declaration under Section 564(b)(1) of the Act, 21 U.S.C.section 360bbb-3(b)(1), unless the authorization is terminated  or revoked sooner.       Influenza A by PCR NEGATIVE NEGATIVE Final   Influenza B by PCR NEGATIVE NEGATIVE Final    Comment: (NOTE) The Xpert Xpress SARS-CoV-2/FLU/RSV plus assay is intended as an aid in the diagnosis of influenza from Nasopharyngeal swab specimens and should not be used as a sole basis  for treatment. Nasal washings and aspirates are  unacceptable for Xpert Xpress SARS-CoV-2/FLU/RSV testing.  Fact Sheet for Patients: BloggerCourse.com  Fact Sheet for Healthcare Providers: SeriousBroker.it  This test is not yet approved or cleared by the [place] FDA and has been authorized for detection and/or diagnosis of SARS-CoV-2 by FDA under an Emergency Use Authorization (EUA). This EUA will remain in effect (meaning this test can be used) for the duration of the COVID-19 declaration under Section 564(b)(1) of the Act, 21 U.S.C. section 360bbb-3(b)(1), unless the authorization is terminated or revoked.  Performed at [facility], [address] [place], [place] [address]      Radiology Studies: DG ESOPHAGUS W SINGLE CM (SOL OR THIN BA)  Result Date: 09/25/2022 CLINICAL DATA:  Esophageal dysmotility. History of Nissen fundoplication in 2017. Long history of esophageal issues with repeated endoscopies for balloon dilation and Botox injection. Now complaining of globus sensation. EXAM: ESOPHAGUS/BARIUM SWALLOW/TABLET STUDY TECHNIQUE: Single contrast examination was performed using thin liquid barium. This exam was performed by Corrin Parker, PA-C, and was supervised and interpreted by Dr. Sebastian Ache. FLUOROSCOPY: Radiation Exposure Index (as provided by the fluoroscopic device): 5.5 mGy Kerma COMPARISON:  Esophagram done August 10, 2019 and CT scan done September 23, 2022 FINDINGS: Swallowing: Appears normal. No vestibular penetration or aspiration seen. Pharynx: Unremarkable. Esophagus: Mildly patulous esophagus.  No evidence of a stricture. Esophageal motility: Moderate esophageal dysmotility with contrast stasis and tertiary contractions. Hiatal Hernia: Moderate-sized sliding hiatal hernia. Gastroesophageal reflux: None visualized. Ingested 13 mm barium tablet: Not given Other: Within the proximal stomach just below the  diaphragm, there is chronic severe narrowing of the gastric lumen over a length of greater than 2 cm which is similar to the prior esophagram and results in delayed/slowed passage of barium into the distal stomach. IMPRESSION: 1. Chronic severe narrowing of the proximal stomach, presumably at the site of prior fundoplication, which is similar to the 2020 esophagram and delays passage of barium. 2. Esophageal dysmotility with tertiary contractions. 3. Mildly patulous esophagus.  No esophageal stricture identified. Electronically Signed   By: Sebastian Ache M.D.   On: 09/25/2022 10:05     Pamella Pert, MD, PhD [facility]  Between 7 am - 7 pm I am available, please contact me via Amion (for emergencies) or Securechat (non urgent messages)  Between 7 pm - 7 am I am not available, please contact night coverage MD/APP via Amion

## 2022-09-25 NOTE — Progress Notes (Signed)
Physical Therapy Treatment Patient Details Name: Julie Rice MRN: [id number] DOB: [birth year] Today's Date: 09/25/2022   History of Present Illness Pt is an 86 y.o. female admitted 09/22/22 with chest burning, palpitations, workup for PAF and SVT. PMH to include HTN, rheumatoid arthritis, CHF, COPD, chronic respiratory failure on 2-1/2 L of oxygen nightly, GERD, depression, and anxiety    PT Comments    Pt steady progressing with mobility. Today's session focused on gait endurance with LRAD. Pt ambulated 75+30 ft RW min guard-minA. Required cuing to widen BOS and for RW negotiation of obstacles to clear objects on R side in hall. Pt remains limited by generalized weakness, decreased activity tolerance, and impaired balance strategies/postural reactions. Continue to recommend acute PT services to maximize functional mobility and independence prior to d/c to SNF level therapies.  SpO2 >89% on RA, HR 80-low90s    Recommendations for follow up therapy are one component of a multi-disciplinary discharge planning process, led by the attending physician.  Recommendations may be updated based on patient status, additional functional criteria and insurance authorization.  Follow Up Recommendations  Skilled nursing-short term rehab (<3 hours/day) Can patient physically be transported by private vehicle: Yes   Assistance Recommended at Discharge    Patient can return home with the following Assistance with cooking/housework;Assist for transportation;Help with stairs or ramp for entrance;A little help with walking and/or transfers;A little help with bathing/dressing/bathroom   Equipment Recommendations  Other (comment) (Defer to next venue of care)    Recommendations for Other Services       Precautions / Restrictions Precautions Precautions: Fall Restrictions Weight Bearing Restrictions: No     Mobility  Bed Mobility Overal bed mobility: Needs Assistance Bed Mobility: Supine to Sit      Supine to sit: Modified independent (Device/Increase time)     General bed mobility comments: pt woke up upon entry and slowly worked her way to EOB, fingering combing hair once upright    Transfers Overall transfer level: Needs assistance Equipment used: Rolling walker (2 wheels) Transfers: Sit to/from Stand Sit to Stand: Min guard           General transfer comment: sit to stand x 2 during session from low rising surfaces requiring increased time and cues for hand placement    Ambulation/Gait Ambulation/Gait assistance: Min guard, Min assist Gait Distance (Feet): 75 Feet (+30) Assistive device: Rolling walker (2 wheels) Gait Pattern/deviations: Step-through pattern, Decreased stride length, Drifts right/left, Narrow base of support Gait velocity: decreased     General Gait Details: slow cautious gait with pt saying hello and waving goodmorning to people in hallway and rooms; steadyiness appeared to improve but continued to require cues to  prevent drifting into wall on R side; energy conservation techniques performed due to DOE, one seated rest break required when pt demonstrated increased SOB   Stairs             Wheelchair Mobility    Modified Rankin (Stroke Patients Only)       Balance Overall balance assessment: Needs assistance Sitting-balance support: No upper extremity supported, Feet supported Sitting balance-Leahy Scale: Good Sitting balance - Comments: prolonged sitting EOB at end of session while pt performed thoughtful reflection for upcoming day Postural control: Posterior lean Standing balance support: Single extremity supported, Bilateral upper extremity supported, During functional activity Standing balance-Leahy Scale: Poor Standing balance comment: intermittent removal of 1 UE to move curtain, brush hair, adjust gown  Cognition Arousal/Alertness: Awake/alert Behavior During Therapy: WFL for tasks  assessed/performed Overall Cognitive Status: Within Functional Limits for tasks assessed                                 General Comments: Pt oriented to person place and year, did discuss that thanksgiving was "around the corner"; cognitive fog occurred during progressed ambulation, requiring seated rest break        Exercises      General Comments General comments (skin integrity, edema, etc.): VSS on RA; SpO2 with 1 brief reading of 89%, sustained ~95% during majority of session. HR 81-low 90s      Pertinent Vitals/Pain Pain Assessment Faces Pain Scale: Hurts a little bit Pain Location: chest pressure after mobility Pain Descriptors / Indicators: Discomfort Pain Intervention(s): Monitored during session    Home Living                          Prior Function            PT Goals (current goals can now be found in the care plan section) Acute Rehab PT Goals Patient Stated Goal: to go home after SNF PT Goal Formulation: With patient Time For Goal Achievement: 10/07/22 Potential to Achieve Goals: Good Progress towards PT goals: Progressing toward goals    Frequency    Min 2X/week      PT Plan Current plan remains appropriate    Co-evaluation              AM-PAC PT "6 Clicks" Mobility   Outcome Measure  Help needed turning from your back to your side while in a flat bed without using bedrails?: None Help needed moving from lying on your back to sitting on the side of a flat bed without using bedrails?: A Little Help needed moving to and from a bed to a chair (including a wheelchair)?: A Little Help needed standing up from a chair using your arms (e.g., wheelchair or bedside chair)?: A Little Help needed to walk in hospital room?: A Little Help needed climbing 3-5 steps with a railing? : A Lot 6 Click Score: 18    End of Session Equipment Utilized During Treatment: Gait belt Activity Tolerance: Patient tolerated treatment  well;Patient limited by fatigue Patient left: with call bell/phone within reach;in bed;Other (comment) (With transport staff) Nurse Communication: Mobility status PT Visit Diagnosis: Unsteadiness on feet (R26.81)     Time: [id number] PT Time Calculation (min) (ACUTE ONLY): 25 min  Charges:  $Gait Training: 8-22 mins $Therapeutic Exercise: 8-22 mins            Chipper Oman, SPT    [place] Julie Rice 09/25/2022, 9:39 AM

## 2022-09-26 DIAGNOSIS — J449 Chronic obstructive pulmonary disease, unspecified: Secondary | ICD-10-CM | POA: Diagnosis not present

## 2022-09-26 DIAGNOSIS — I10 Essential (primary) hypertension: Secondary | ICD-10-CM | POA: Diagnosis not present

## 2022-09-26 DIAGNOSIS — J9611 Chronic respiratory failure with hypoxia: Secondary | ICD-10-CM | POA: Diagnosis not present

## 2022-09-26 DIAGNOSIS — I471 Supraventricular tachycardia, unspecified: Secondary | ICD-10-CM | POA: Diagnosis not present

## 2022-09-26 LAB — CBC
HCT: 28.6 % — ABNORMAL LOW (ref 36.0–46.0)
Hemoglobin: 9.3 g/dL — ABNORMAL LOW (ref 12.0–15.0)
MCH: 29.8 pg (ref 26.0–34.0)
MCHC: 32.5 g/dL (ref 30.0–36.0)
MCV: 91.7 fL (ref 80.0–100.0)
Platelets: 319 10*3/uL (ref 150–400)
RBC: 3.12 MIL/uL — ABNORMAL LOW (ref 3.87–5.11)
RDW: 14.9 % (ref 11.5–15.5)
WBC: 9.3 10*3/uL (ref 4.0–10.5)
nRBC: 0 % (ref 0.0–0.2)

## 2022-09-26 LAB — BASIC METABOLIC PANEL
Anion gap: 11 (ref 5–15)
BUN: 22 mg/dL (ref 8–23)
CO2: 23 mmol/L (ref 22–32)
Calcium: 8.6 mg/dL — ABNORMAL LOW (ref 8.9–10.3)
Chloride: 103 mmol/L (ref 98–111)
Creatinine, Ser: 1.14 mg/dL — ABNORMAL HIGH (ref 0.44–1.00)
GFR, Estimated: 47 mL/min — ABNORMAL LOW (ref >60)
Glucose, Bld: 103 mg/dL — ABNORMAL HIGH (ref 70–99)
Potassium: 4.7 mmol/L (ref 3.5–5.1)
Sodium: 137 mmol/L (ref 135–145)

## 2022-09-26 LAB — MAGNESIUM: Magnesium: 2.1 mg/dL (ref 1.7–2.4)

## 2022-09-26 NOTE — Progress Notes (Signed)
PROGRESS NOTE  Julie Rice:[id number] DOB: December 11, 1935 DOA: 09/22/2022 PCP: Shirline Frees, NP   LOS: 0 days   Brief Narrative / Interim history: 86 year old with HTN, RA, chronic diastolic CHF, COPD, chronic respiratory failure at home on 2L at home, esophageal strictures, PAF, CAD who comes to the hospital with complaints of chest burning and palpitations.  On telemetry she was found to have wide-complex tachycardia and cardiology was consulted.  Subjective / 24h Interval events: No chest pain, no shortness of breath.  Overall doing well this morning.  Assesement and Plan: Principal Problem:   Arrhythmia Active Problems:   Major depressive disorder, recurrent (HCC)   Generalized anxiety disorder   Hypothyroidism   Dyslipidemia   Essential hypertension   ESOPHAGEAL STRICTURE   Esophageal stricture   (HFpEF) heart failure with preserved ejection fraction (HCC)   Prolonged QT interval   Chronic respiratory failure with hypoxia (HCC)   COPD (chronic obstructive pulmonary disease) (HCC)   PAF (paroxysmal atrial fibrillation) (HCC)   Chronic pain syndrome   Myofascial pain   Rheumatoid arthritis involving multiple sites (HCC)   Paroxysmal SVT (supraventricular tachycardia)   Idiopathic peripheral neuropathy  Principal problem SVT / PAF - with wide complex tachycardia.  Cardiology/EP consulted, evaluated patient and she was now started on amiodarone, continue along with metoprolol.  Rates much better controlled -Eliquis now on hold as gastroenterology plans to do an EGD tomorrow  Active problems Dysphagia, chronic esophageal strictures-she has been having progressive symptoms, GI consulted, appreciate input.  Plan for EGD tomorrow  Chronic diastolic CHF-appears euvolemic  HTN-continue metoprolol, blood pressure stable.  Spironolactone and losartan are on hold  GAD-continue Wellbutrin, Klonopin, duloxetine  COPD with chronic hypoxic respiratory failure-wears oxygen at  night only.  Sees Dr. Sherene Sires as an outpatient  Leukocytosis-white count stable  Mild normocytic anemia-of chronic disease   Acquired Scheduled Meds:  amiodarone  200 mg Oral Daily   buPROPion  300 mg Oral Daily   clonazePAM  0.5 mg Oral BID   DULoxetine  60 mg Oral Daily   loratadine  10 mg Oral Daily   metoprolol tartrate  100 mg Oral BID   pantoprazole  40 mg Oral Daily   pregabalin  50 mg Oral Daily   Continuous Infusions: PRN Meds:.acetaminophen **OR** acetaminophen, albuterol, bismuth subsalicylate, senna-docusate  Current Outpatient Medications  Medication Instructions   acetaminophen (TYLENOL) 1,000 mg, Oral, Every 6 hours PRN   albuterol (VENTOLIN HFA) 108 (90 Base) MCG/ACT inhaler TAKE 2 PUFFS BY MOUTH EVERY 6 HOURS AS NEEDED FOR WHEEZE OR SHORTNESS OF BREATH   buPROPion (WELLBUTRIN XL) 300 mg, Oral, Daily   Carboxymethylcellulose Sodium (REFRESH TEARS OP) 1 drop, Both Eyes, 2 times daily   cetirizine (ZYRTEC) 10 mg, Oral, Daily PRN   clonazePAM (KLONOPIN) 0.25 mg, Oral, 2 times daily PRN   DULoxetine (CYMBALTA) 60 MG capsule TAKE 1 CAPSULE BY MOUTH EVERY DAY   ELIQUIS 5 MG TABS tablet TAKE 1 TABLET BY MOUTH TWICE A DAY   FeFum-FePoly-FA-B Cmp-C-Biot (INTEGRA PLUS) CAPS 1 capsule, Oral, Every morning   KLOR-CON M20 20 MEQ tablet 20 mEq, Oral, Daily   losartan (COZAAR) 50 mg, Oral, Daily   metoprolol tartrate (LOPRESSOR) 75 mg, Oral, 2 times daily   Multiple Vitamins-Minerals (ONE-A-DAY WOMENS 50+) TABS 1 tablet, Oral, Daily   omeprazole (PRILOSEC) 20 MG capsule TAKE 1 CAPSULE BY MOUTH EVERY DAY   pregabalin (LYRICA) 50 MG capsule Oral, Daily   sodium chloride (OCEAN) 0.65 % SOLN nasal  spray 1 spray, Each Nare, As needed   spironolactone (ALDACTONE) 25 mg, Oral, Daily    Diet Orders (From admission, onward)     Start     Ordered   09/27/22 0001  Diet NPO time specified  Diet effective ____        09/25/22 1555   09/27/22 0001  Diet NPO time specified  Diet  effective midnight        09/25/22 1643   09/25/22 1345  DIET SOFT Room service appropriate? Yes; Fluid consistency: Thin  Diet effective now       Question Answer Comment  Room service appropriate? Yes   Fluid consistency: Thin      09/25/22 1344            DVT prophylaxis: SCDs Start: 09/22/22 2210   Lab Results  Component Value Date   PLT 319 09/26/2022      Code Status: Full Code  Family Communication: no family at bedside   Status is: Observation The patient will require care spanning > 2 midnights and should be moved to inpatient because: Persistent GI symptoms, evaluation ongoing.  She also needs SNF   Level of care: Telemetry Medical  Consultants:  GI Cardiology   Objective: Vitals:   09/25/22 0941 09/25/22 1920 09/26/22 0613 09/26/22 0737  BP: 119/66 130/65 134/61 (!) 146/68  Pulse: 65 69 61 62  Resp: 16 18 18 18   Temp: (!) 97.5 F (36.4 C) 97.8 F (36.6 C) (!) 97.5 F (36.4 C) 97.7 F (36.5 C)  TempSrc: Oral Oral Oral Oral  SpO2: 99% 98% 97% 97%  Weight:   66.2 kg   Height:        Intake/Output Summary (Last 24 hours) at 09/26/2022 1002 Last data filed at 09/26/2022 0800 Gross per 24 hour  Intake 820 ml  Output 1550 ml  Net -730 ml    Wt Readings from Last 3 Encounters:  09/26/22 66.2 kg  08/27/22 70.1 kg  08/15/22 68.9 kg    Examination:  Constitutional: NAD Eyes: lids and conjunctivae normal, no scleral icterus ENMT: mmm Neck: normal, supple Respiratory: clear to auscultation bilaterally, no wheezing, no crackles. Normal respiratory effort.  Cardiovascular: Regular rate and rhythm, no murmurs / rubs / gallops. No LE edema. Abdomen: soft, no distention, no tenderness. Bowel sounds positive.  Skin: no rashes Neurologic: no focal deficits, equal strength   Data Reviewed: I have independently reviewed following labs and imaging studies   CBC Recent Labs  Lab 09/22/22 1705 09/22/22 1719 09/23/22 0940 09/24/22 0025  09/25/22 0046 09/26/22 0028  WBC 12.5*  --  11.0* 12.4* 10.9* 9.3  HGB 9.8* 10.2* 10.1* 10.0* 10.0* 9.3*  HCT 30.5* 30.0* 31.5* 32.2* 31.3* 28.6*  PLT 339  --  352 354 364 319  MCV 93.0  --  92.9 94.2 91.5 91.7  MCH 29.9  --  29.8 29.2 29.2 29.8  MCHC 32.1  --  32.1 31.1 31.9 32.5  RDW 15.0  --  15.3 15.1 14.8 14.9  LYMPHSABS 1.3  --  1.1 1.2 1.6  --   MONOABS 0.9  --  0.9 1.1* 1.1*  --   EOSABS 0.1  --  0.2 0.3 0.3  --   BASOSABS 0.1  --  0.1 0.1 0.1  --      Recent Labs  Lab 09/22/22 1705 09/22/22 1719 09/22/22 1730 09/23/22 0201 09/24/22 0025 09/25/22 0046 09/26/22 0028  NA 138 137  --  137 137 135 137  K 3.5 3.5  --  4.3 4.9 4.7 4.7  CL 101  --   --  100 102 100 103  CO2 26  --   --  26 27 26 23   GLUCOSE 102*  --   --  95 150* 113* 103*  BUN 12  --   --  13 10 20 22   CREATININE 0.89  --   --  0.94 0.89 1.02* 1.14*  CALCIUM 8.9  --   --  8.7* 9.1 8.7* 8.6*  AST 17  --   --   --  16 18  --   ALT 15  --   --   --  14 16  --   ALKPHOS 64  --   --   --  58 57  --   BILITOT 0.6  --   --   --  0.3 0.4  --   ALBUMIN 3.1*  --   --   --  3.3* 3.0*  --   MG  --   --   --  2.3 2.4 2.4 2.1  DDIMER <0.27  --   --   --   --   --   --   TSH  --   --   --   --  1.528  --   --   BNP  --   --  438.0*  --   --   --   --      ------------------------------------------------------------------------------------------------------------------ No results for input(s): "CHOL", "HDL", "LDLCALC", "TRIG", "CHOLHDL", "LDLDIRECT" in the last 72 hours.  Lab Results  Component Value Date   HGBA1C 5.7 07/31/2022   ------------------------------------------------------------------------------------------------------------------ Recent Labs    09/24/22 0025  TSH 1.528     Cardiac Enzymes No results for input(s): "CKMB", "TROPONINI", "MYOGLOBIN" in the last 168 hours.  Invalid input(s):  "CK" ------------------------------------------------------------------------------------------------------------------    Component Value Date/Time   BNP 438.0 (H) 09/22/2022 1730   BNP 89 02/12/2021 1506    CBG: No results for input(s): "GLUCAP" in the last 168 hours.  Recent Results (from the past 240 hour(s))  Resp Panel by RT-PCR (Flu A&B, Covid) Anterior Nasal Swab     Status: None   Collection Time: 09/22/22  5:28 PM   Specimen: Anterior Nasal Swab  Result Value Ref Range Status   SARS Coronavirus 2 by RT PCR NEGATIVE NEGATIVE Final    Comment: (NOTE) SARS-CoV-2 target nucleic acids are NOT DETECTED.  The SARS-CoV-2 RNA is generally detectable in upper respiratory specimens during the acute phase of infection. The lowest concentration of SARS-CoV-2 viral copies this assay can detect is 138 copies/mL. A negative result does not preclude SARS-Cov-2 infection and should not be used as the sole basis for treatment or other patient management decisions. A negative result may occur with  improper specimen collection/handling, submission of specimen other than nasopharyngeal swab, presence of viral mutation(s) within the areas targeted by this assay, and inadequate number of viral copies(<138 copies/mL). A negative result must be combined with clinical observations, patient history, and epidemiological information. The expected result is Negative.  Fact Sheet for Patients:  04/14/2021  Fact Sheet for Healthcare Providers:  09/24/22  This test is no t yet approved or cleared by the [employer] FDA and  has been authorized for detection and/or diagnosis of SARS-CoV-2 by FDA under an Emergency Use Authorization (EUA). This EUA will remain  in effect (meaning this test can be used) for the duration of the COVID-19 declaration under  Section 564(b)(1) of the Act, 21 U.S.C.section 360bbb-3(b)(1), unless the  authorization is terminated  or revoked sooner.       Influenza A by PCR NEGATIVE NEGATIVE Final   Influenza B by PCR NEGATIVE NEGATIVE Final    Comment: (NOTE) The Xpert Xpress SARS-CoV-2/FLU/RSV plus assay is intended as an aid in the diagnosis of influenza from Nasopharyngeal swab specimens and should not be used as a sole basis for treatment. Nasal washings and aspirates are unacceptable for Xpert Xpress SARS-CoV-2/FLU/RSV testing.  Fact Sheet for Patients: [employer]  Fact Sheet for Healthcare Providers: [employer]it  This test is not yet approved or cleared by the [place] FDA and has been authorized for detection and/or diagnosis of SARS-CoV-2 by FDA under an Emergency Use Authorization (EUA). This EUA will remain in effect (meaning this test can be used) for the duration of the COVID-19 declaration under Section 564(b)(1) of the Act, 21 U.S.C. section 360bbb-3(b)(1), unless the authorization is terminated or revoked.  Performed at [facility], [address], [place], [place] [address]      Radiology Studies: No results found.   Pamella Pert, MD, PhD [facility]  Between 7 am - 7 pm I am available, please contact me via Amion (for emergencies) or Securechat (non urgent messages)  Between 7 pm - 7 am I am not available, please contact night coverage MD/APP via Amion

## 2022-09-26 NOTE — TOC Progression Note (Signed)
Transition of Care [facility]) - Progression Note    Patient Details  Name: Julie Rice MRN: [id number] Date of Birth: 09-22-1936  Transition of Care [facility]) CM/SW [place], [place] Phone Number: 09/26/2022, 10:40 AM  Clinical Narrative:     CSW met with pt bedside to discuss SNF choice. She chooses [facility].   CSW contacted [facility] liaison to confirm bed; awaiting response.   Expected Discharge Plan: [place] Barriers to Discharge: Continued Medical Work up  Expected Discharge Plan and Services Expected Discharge Plan: [place] arrangements for the past 2 months: Single Family Home                                       Social Determinants of Health (SDOH) Interventions    Readmission Risk Interventions     No data to display

## 2022-09-26 NOTE — Progress Notes (Signed)
   09/26/22 1100  Mobility  Activity Ambulated with assistance in hallway  Level of Assistance Contact guard assist, steadying assist  Assistive Device Front wheel walker  Distance Ambulated (ft) 180 ft  Activity Response Tolerated well  Mobility Referral Yes  $Mobility charge 1 Mobility   Mobility Specialist Progress Note  Pt was in bed and agreeable. Had no c/o pain throughout ambulation. Returned to chair w/ all needs met and call bell in reach.   Lucious Groves Mobility Specialist

## 2022-09-27 ENCOUNTER — Encounter (HOSPITAL_COMMUNITY)
Admission: EM | Disposition: A | Discharge status: Skilled Nursing Facility | Payer: Self-pay | Source: Home / Self Care | Attending: Internal Medicine

## 2022-09-27 ENCOUNTER — Observation Stay (HOSPITAL_COMMUNITY): Payer: Medicare Other

## 2022-09-27 ENCOUNTER — Observation Stay (HOSPITAL_COMMUNITY): Payer: Medicare Other | Admitting: Anesthesiology

## 2022-09-27 ENCOUNTER — Observation Stay (HOSPITAL_BASED_OUTPATIENT_CLINIC_OR_DEPARTMENT_OTHER): Payer: Medicare Other | Admitting: Anesthesiology

## 2022-09-27 DIAGNOSIS — I110 Hypertensive heart disease with heart failure: Secondary | ICD-10-CM

## 2022-09-27 DIAGNOSIS — K449 Diaphragmatic hernia without obstruction or gangrene: Secondary | ICD-10-CM | POA: Diagnosis not present

## 2022-09-27 DIAGNOSIS — K209 Esophagitis, unspecified without bleeding: Secondary | ICD-10-CM | POA: Diagnosis not present

## 2022-09-27 DIAGNOSIS — K222 Esophageal obstruction: Secondary | ICD-10-CM

## 2022-09-27 DIAGNOSIS — I2510 Atherosclerotic heart disease of native coronary artery without angina pectoris: Secondary | ICD-10-CM

## 2022-09-27 DIAGNOSIS — J449 Chronic obstructive pulmonary disease, unspecified: Secondary | ICD-10-CM | POA: Diagnosis not present

## 2022-09-27 DIAGNOSIS — I10 Essential (primary) hypertension: Secondary | ICD-10-CM | POA: Diagnosis not present

## 2022-09-27 DIAGNOSIS — E039 Hypothyroidism, unspecified: Secondary | ICD-10-CM

## 2022-09-27 DIAGNOSIS — I509 Heart failure, unspecified: Secondary | ICD-10-CM

## 2022-09-27 DIAGNOSIS — J9611 Chronic respiratory failure with hypoxia: Secondary | ICD-10-CM | POA: Diagnosis not present

## 2022-09-27 DIAGNOSIS — I471 Supraventricular tachycardia, unspecified: Secondary | ICD-10-CM | POA: Diagnosis not present

## 2022-09-27 DIAGNOSIS — R1314 Dysphagia, pharyngoesophageal phase: Secondary | ICD-10-CM | POA: Diagnosis not present

## 2022-09-27 DIAGNOSIS — D638 Anemia in other chronic diseases classified elsewhere: Secondary | ICD-10-CM

## 2022-09-27 HISTORY — PX: ESOPHAGOGASTRODUODENOSCOPY (EGD) WITH PROPOFOL: SHX5813

## 2022-09-27 HISTORY — PX: BOTOX INJECTION: SHX5754

## 2022-09-27 LAB — BASIC METABOLIC PANEL
Anion gap: 8 (ref 5–15)
BUN: 26 mg/dL — ABNORMAL HIGH (ref 8–23)
CO2: 25 mmol/L (ref 22–32)
Calcium: 8.8 mg/dL — ABNORMAL LOW (ref 8.9–10.3)
Chloride: 105 mmol/L (ref 98–111)
Creatinine, Ser: 1.31 mg/dL — ABNORMAL HIGH (ref 0.44–1.00)
GFR, Estimated: 40 mL/min — ABNORMAL LOW (ref >60)
Glucose, Bld: 103 mg/dL — ABNORMAL HIGH (ref 70–99)
Potassium: 4.6 mmol/L (ref 3.5–5.1)
Sodium: 138 mmol/L (ref 135–145)

## 2022-09-27 LAB — CBC
HCT: 26.8 % — ABNORMAL LOW (ref 36.0–46.0)
Hemoglobin: 8.7 g/dL — ABNORMAL LOW (ref 12.0–15.0)
MCH: 30 pg (ref 26.0–34.0)
MCHC: 32.5 g/dL (ref 30.0–36.0)
MCV: 92.4 fL (ref 80.0–100.0)
Platelets: 323 10*3/uL (ref 150–400)
RBC: 2.9 MIL/uL — ABNORMAL LOW (ref 3.87–5.11)
RDW: 14.8 % (ref 11.5–15.5)
WBC: 10.5 10*3/uL (ref 4.0–10.5)
nRBC: 0 % (ref 0.0–0.2)

## 2022-09-27 SURGERY — ESOPHAGOGASTRODUODENOSCOPY (EGD) WITH PROPOFOL
Anesthesia: Monitor Anesthesia Care

## 2022-09-27 MED ORDER — SODIUM CHLORIDE (PF) 0.9 % IJ SOLN
100.0000 [IU] | INTRAMUSCULAR | Status: AC
  Filled 2022-09-27: qty 100

## 2022-09-27 MED ORDER — LACTATED RINGERS IV SOLN: INTRAVENOUS | Status: DC | PRN

## 2022-09-27 MED ORDER — SODIUM CHLORIDE 0.9 % IV SOLN: INTRAVENOUS | Status: AC

## 2022-09-27 MED ORDER — PROPOFOL 10 MG/ML IV BOLUS
INTRAVENOUS | Status: DC | PRN
  Administered 2022-09-27: 20 mg via INTRAVENOUS
  Administered 2022-09-27: 30 mg via INTRAVENOUS

## 2022-09-27 MED ORDER — PROPOFOL 500 MG/50ML IV EMUL
INTRAVENOUS | Status: DC | PRN
  Administered 2022-09-27: 100 ug/kg/min via INTRAVENOUS

## 2022-09-27 MED ORDER — SODIUM CHLORIDE (PF) 0.9 % IJ SOLN
INTRAMUSCULAR | Status: DC | PRN
  Administered 2022-09-27: 4 mL via SUBMUCOSAL

## 2022-09-27 SURGICAL SUPPLY — 15 items

## 2022-09-27 NOTE — Op Note (Signed)
[facility] Patient Name: Julie Rice Procedure Date : 09/27/2022 MRN: [id number] Attending MD: Starr Lake. Myrtie Neither , MD, [id number] Date of Birth: [birth year] CSN: [id number] Age: 86 Admit Type: Inpatient Procedure:                Upper GI endoscopy Indications:              Esophageal dysphagia                           chronic post-fundoplicaton achalasia-like                            condition, last EGD/dilation/botox 2020. recent                            recurrence of symptoms Providers:                Sherilyn Cooter L. Myrtie Neither, MD, Adolph Pollack, RN Referring MD:             [facility] Medicines:                Monitored Anesthesia Care Complications:            No immediate complications. Estimated Blood Loss:     Estimated blood loss was minimal. Procedure:                Pre-Anesthesia Assessment:                           - Prior to the procedure, a History and Physical                            was performed, and patient medications and                            allergies were reviewed. The patient's tolerance of                            previous anesthesia was also reviewed. The risks                            and benefits of the procedure and the sedation                            options and risks were discussed with the patient.                            All questions were answered, and informed consent                            was obtained. Prior Anticoagulants: The patient has                            taken Eliquis (apixaban), last dose was 2 days                            prior to  procedure. ASA Grade Assessment: IV - A                            patient with severe systemic disease that is a                            constant threat to life. After reviewing the risks                            and benefits, the patient was deemed in                            satisfactory condition to undergo the procedure.                           After  obtaining informed consent, the endoscope was                            passed under direct vision. Throughout the                            procedure, the patient's blood pressure, pulse, and                            oxygen saturations were monitored continuously. The                            [id number]-[id number] ([id number]) Olympus endoscope was introduced                            through the mouth, and advanced to the second part                            of duodenum. The upper GI endoscopy was                            accomplished without difficulty. The patient                            tolerated the procedure well. Scope In: Scope Out: Findings:      The larynx was normal.      A 4 cm hiatal hernia was present. There was food debris within it.      LA Grade C (one or more mucosal breaks continuous between tops of 2 or       more mucosal folds, less than 75% circumference) esophagitis with no       bleeding was found in the lower third of the esophagus. There was food       debris in the esophagus, pushe into the stomach. The esophagus was       somewhat patulous. Active motility was seen.      One benign-appearing, intrinsic moderate stenosis was found. This       stenosis measured 1.2 cm (inner diameter). Scope passed with minimal       resistance. Appeared like increased LES tone  rather than fibrotic       stricture. No balloon dilation done due to bleeding risk with active       esophagitis in patient who needs to resume OAC. Area was successfully       injected with 100 units botulinum toxin. (4 quadrant fashion)      Evidence of a fundoplication was found in the gastric fundus. The wrap       appeared intact.      The examined duodenum was normal. Impression:               - Normal larynx.                           - 4 cm hiatal hernia.                           - LA Grade C esophagitis with no bleeding.                           - Benign-appearing esophageal stenosis.  Injected                            with botulinum toxin.                           - A fundoplication was found. The wrap appears                            intact.                           - Normal examined duodenum.                           - No specimens collected.                           Not clear if this esophagitis is from GERD or                            stasis of esophageal contents from motility                            disorder, but suspect more the latter. Recommendation:           - Return patient to hospital ward for ongoing care.                           - Resume regular diet.                           - Resume Eliquis (apixaban) at prior dose tomorrow.                           - Head of bed elevated at all times (especially  when sleeping) to decrease risk of aspiration.                           Follow up will be arranged with Dr. Yancey Flemings.                           Patient can be discharged from GI standpoint when                            medically stable.                           Pantoprazole 40 mg twice daily until GI follow up. Procedure Code(s):        --- Professional ---                           [phone], Esophagogastroduodenoscopy, flexible,                            transoral; with directed submucosal injection(s),                            any substance Diagnosis Code(s):        --- Professional ---                           K44.9, Diaphragmatic hernia without obstruction or                            gangrene                           K20.90, Esophagitis, unspecified without bleeding                           K22.2, Esophageal obstruction                           R13.14, Dysphagia, pharyngoesophageal phase CPT copyright 2022 American Medical Association. All rights reserved. The codes documented in this report are preliminary and upon coder review may  be revised to meet current compliance requirements. Venisa Frampton L.  Myrtie Neither, MD 09/27/2022 9:42:21 AM This report has been signed electronically. Number of Addenda: 0

## 2022-09-27 NOTE — TOC Progression Note (Signed)
Transition of Care [facility]) - Progression Note    Patient Details  Name: Julie Rice MRN: [id number] Date of Birth: 16-Jun-1936  [facility] CM/SW Contact  Erin Sons, [place] Phone Number: 09/27/2022, 12:24 PM  Clinical Narrative:     CSW confirmed with [facility] that they can accept pt tomorrow pending auth approval. CSW updated pt. Submitted Sn[facility] auth request in navi portal. Auth status: pending.   Expected Discharge Plan: Skilled Nursing Facility Barriers to Discharge: Continued Medical Work up  Expected Discharge Plan and Services Expected Discharge Plan: Skilled Nursing Facility       Living arrangements for the past 2 months: Single Family Home                                       Social Determinants of Health (SDOH) Interventions    Readmission Risk Interventions     No data to display

## 2022-09-27 NOTE — Plan of Care (Signed)
  Problem: Clinical Measurements: Goal: Diagnostic test results will improve Outcome: Progressing Goal: Respiratory complications will improve Outcome: Progressing   Problem: Activity: Goal: Risk for activity intolerance will decrease Outcome: Progressing   

## 2022-09-27 NOTE — Interval H&P Note (Signed)
History and Physical Interval Note:  09/27/2022 9:06 AM  Julie Rice  has presented today for surgery, with the diagnosis of dysphagia,  esophageal stricture, stricture at sit fundoplication anastomosis..  The various methods of treatment have been discussed with the patient and family. After consideration of risks, benefits and other options for treatment, the patient has consented to  Procedure(s): ESOPHAGOGASTRODUODENOSCOPY (EGD) WITH PROPOFOL (N/A) BOTOX INJECTION (N/A) BALLOON DILATION (N/A) as a surgical intervention.  The patient's history has been reviewed, patient examined, no change in status, stable for surgery.  I have reviewed the patient's chart and labs.  Questions were answered to the patient's satisfaction.     [name]

## 2022-09-27 NOTE — Anesthesia Procedure Notes (Signed)
Procedure Name: MAC Date/Time: 09/27/2022 9:10 AM  Performed by: Griffin Dakin, CRNAPre-anesthesia Checklist: Patient identified, Emergency Drugs available, Suction available, Patient being monitored and Timeout performed Patient Re-evaluated:Patient Re-evaluated prior to induction Oxygen Delivery Method: Nasal cannula Preoxygenation: Pre-oxygenation with 100% oxygen Induction Type: IV induction Number of attempts: 1 Airway Equipment and Method: Bite block Placement Confirmation: positive ETCO2 and breath sounds checked- equal and bilateral Dental Injury: Teeth and Oropharynx as per pre-operative assessment

## 2022-09-27 NOTE — Plan of Care (Signed)
  Problem: Coping: Goal: Level of anxiety will decrease Outcome: Completed/Met   Problem: Elimination: Goal: Will not experience complications related to bowel motility Outcome: Completed/Met Goal: Will not experience complications related to urinary retention Outcome: Completed/Met   Problem: Pain Managment: Goal: General experience of comfort will improve Outcome: Completed/Met   Problem: Safety: Goal: Ability to remain free from injury will improve Outcome: Completed/Met   Problem: Skin Integrity: Goal: Risk for impaired skin integrity will decrease Outcome: Completed/Met

## 2022-09-27 NOTE — Anesthesia Postprocedure Evaluation (Signed)
Anesthesia Post Note  Patient: Myrikal C Lunn  Procedure(s) Performed: ESOPHAGOGASTRODUODENOSCOPY (EGD) WITH PROPOFOL BOTOX INJECTION     Patient location during evaluation: PACU Anesthesia Type: MAC Level of consciousness: awake and alert Pain management: pain level controlled Vital Signs Assessment: post-procedure vital signs reviewed and stable Respiratory status: spontaneous breathing, nonlabored ventilation, respiratory function stable and patient connected to nasal cannula oxygen Cardiovascular status: stable and blood pressure returned to baseline Postop Assessment: no apparent nausea or vomiting Anesthetic complications: no  No notable events documented.  Last Vitals:  Vitals:   09/27/22 0945 09/27/22 1015  BP: (!) 148/61 (!) 161/55  Pulse: (!) 54   Resp: 15   Temp: 36.9 C   SpO2: 95% 100%    Last Pain:  Vitals:   09/27/22 1001  TempSrc:   PainSc: 0-No pain                 Kazmir Oki

## 2022-09-27 NOTE — Anesthesia Preprocedure Evaluation (Signed)
Anesthesia Evaluation  Patient identified by MRN, date of birth, ID band Patient awake    Reviewed: Allergy & Precautions, H&P , NPO status , Patient's Chart, lab work & pertinent test results  History of Anesthesia Complications (+) PONV and history of anesthetic complications  Airway Mallampati: II   Neck ROM: full    Dental   Pulmonary shortness of breath, asthma , COPD   breath sounds clear to auscultation       Cardiovascular hypertension, Pt. on medications + CAD and +CHF  + dysrhythmias Atrial Fibrillation and Supra Ventricular Tachycardia  Rhythm:regular Rate:Normal     Neuro/Psych  PSYCHIATRIC DISORDERS Anxiety Depression    TIA Neuromuscular disease    GI/Hepatic hiatal hernia,GERD  Medicated,,  Endo/Other  Hypothyroidism    Renal/GU      Musculoskeletal  (+) Arthritis ,  Fibromyalgia -  Abdominal   Peds  Hematology  (+) Blood dyscrasia, anemia   Anesthesia Other Findings   Reproductive/Obstetrics                             Anesthesia Physical Anesthesia Plan  ASA: 3  Anesthesia Plan: MAC   Post-op Pain Management:    Induction: Intravenous  PONV Risk Score and Plan: 3 and Ondansetron, Dexamethasone, Treatment may vary due to age or medical condition and Propofol infusion  Airway Management Planned: Simple Face Mask and Natural Airway  Additional Equipment: None  Intra-op Plan:   Post-operative Plan:   Informed Consent: I have reviewed the patients History and Physical, chart, labs and discussed the procedure including the risks, benefits and alternatives for the proposed anesthesia with the patient or authorized representative who has indicated his/her understanding and acceptance.     Dental advisory given  Plan Discussed with: CRNA and Anesthesiologist  Anesthesia Plan Comments: (ECHO 5/23' 1. Left ventricular ejection fraction, by estimation, is 50 to  55%. The  left ventricle has low normal function. The left ventricle has no regional  wall motion abnormalities. There is mild left ventricular hypertrophy.  Left ventricular diastolic  parameters are consistent with Grade II diastolic dysfunction  (pseudonormalization). Elevated left atrial pressure.   2. Right ventricular systolic function is normal. The right ventricular  size is mildly enlarged. There is moderately elevated pulmonary artery  systolic pressure. The estimated right ventricular systolic pressure is  50.9 mmHg.   3. Left atrial size was severely dilated.   4. Right atrial size was mildly dilated.   5. The mitral valve is degenerative. Mild to moderate mitral valve  regurgitation. No evidence of mitral stenosis. Moderate mitral annular  calcification.   6. Tricuspid valve regurgitation is moderate.   7. The aortic valve is tricuspid. Aortic valve regurgitation is mild.  Aortic valve sclerosis is present, with no evidence of aortic valve  stenosis.   8. The inferior vena cava is normal in size with greater than 50%  respiratory variability, suggesting right atrial pressure of 3 mmHg.  )        Anesthesia Quick Evaluation

## 2022-09-27 NOTE — Progress Notes (Signed)
PROGRESS NOTE  Julie Rice OZY:[id number] DOB: 05-Aug-1936 DOA: 09/22/2022 PCP: Shirline Frees, NP   LOS: 0 days   Brief Narrative / Interim history: 86 year old with HTN, RA, chronic diastolic CHF, COPD, chronic respiratory failure at home on 2L at home, esophageal strictures, PAF, CAD who comes to the hospital with complaints of chest burning and palpitations.  On telemetry she was found to have wide-complex tachycardia and cardiology was consulted.  Subjective / 24h Interval events: Doing well this morning, awaiting EGD  Assesement and Plan: Principal Problem:   Arrhythmia Active Problems:   Major depressive disorder, recurrent (HCC)   Generalized anxiety disorder   Hypothyroidism   Dyslipidemia   Essential hypertension   ESOPHAGEAL STRICTURE   Esophageal stricture   (HFpEF) heart failure with preserved ejection fraction (HCC)   Prolonged QT interval   Chronic respiratory failure with hypoxia (HCC)   COPD (chronic obstructive pulmonary disease) (HCC)   PAF (paroxysmal atrial fibrillation) (HCC)   Chronic pain syndrome   Myofascial pain   Rheumatoid arthritis involving multiple sites (HCC)   Paroxysmal SVT (supraventricular tachycardia)   Idiopathic peripheral neuropathy  Principal problem SVT / PAF - with wide complex tachycardia.  Cardiology/EP consulted, evaluated patient and she was now started on amiodarone, continue along with metoprolol.  Rates much better controlled -Eliquis remains on hold, resume tomorrow per GI  Active problems Dysphagia, chronic esophageal strictures-she has been having progressive symptoms, GI consulted, appreciate input.  She underwent an endoscopy this morning which showed benign-appearing esophageal stenosis status post botulinum toxin injection.  Did not underwent dilatation due to a degree of esophagitis.  Resume Eliquis tomorrow  Weakness-discussed with SW, SNF can accept her tomorrow  Mild AKI-creatinine up to 1.31 today, it was  0.89 on admission.  Possibly due to poor p.o. intake in the setting of dysphagia, also n.p.o., ARB.  Provide some fluids today, continue to hold spironolactone  Chronic diastolic CHF-appears euvolemic  HTN-continue metoprolol, blood pressure stable.  Spironolactone and losartan are on hold, continue to hold due to slight creatinine elevation  GAD-continue Wellbutrin, Klonopin, duloxetine  COPD with chronic hypoxic respiratory failure-wears oxygen at night only.  Sees Dr. Sherene Sires as an outpatient  Leukocytosis-white count stable  Mild normocytic anemia-of chronic disease   Acquired Scheduled Meds:  amiodarone  200 mg Oral Daily   botox 100 units/NS 4 mL for final conc. 25 units/mL mixture  100 Units Submucosal To Endo   buPROPion  300 mg Oral Daily   clonazePAM  0.5 mg Oral BID   DULoxetine  60 mg Oral Daily   loratadine  10 mg Oral Daily   metoprolol tartrate  100 mg Oral BID   pantoprazole  40 mg Oral Daily   pregabalin  50 mg Oral Daily   Continuous Infusions:  sodium chloride     PRN Meds:.acetaminophen **OR** acetaminophen, albuterol, bismuth subsalicylate, senna-docusate  Current Outpatient Medications  Medication Instructions   acetaminophen (TYLENOL) 1,000 mg, Oral, Every 6 hours PRN   albuterol (VENTOLIN HFA) 108 (90 Base) MCG/ACT inhaler TAKE 2 PUFFS BY MOUTH EVERY 6 HOURS AS NEEDED FOR WHEEZE OR SHORTNESS OF BREATH   buPROPion (WELLBUTRIN XL) 300 mg, Oral, Daily   Carboxymethylcellulose Sodium (REFRESH TEARS OP) 1 drop, Both Eyes, 2 times daily   cetirizine (ZYRTEC) 10 mg, Oral, Daily PRN   clonazePAM (KLONOPIN) 0.25 mg, Oral, 2 times daily PRN   DULoxetine (CYMBALTA) 60 MG capsule TAKE 1 CAPSULE BY MOUTH EVERY DAY   ELIQUIS 5 MG TABS  tablet TAKE 1 TABLET BY MOUTH TWICE A DAY   FeFum-FePoly-FA-B Cmp-C-Biot (INTEGRA PLUS) CAPS 1 capsule, Oral, Every morning   KLOR-CON M20 20 MEQ tablet 20 mEq, Oral, Daily   losartan (COZAAR) 50 mg, Oral, Daily   metoprolol tartrate  (LOPRESSOR) 75 mg, Oral, 2 times daily   Multiple Vitamins-Minerals (ONE-A-DAY WOMENS 50+) TABS 1 tablet, Oral, Daily   omeprazole (PRILOSEC) 20 MG capsule TAKE 1 CAPSULE BY MOUTH EVERY DAY   pregabalin (LYRICA) 50 MG capsule Oral, Daily   sodium chloride (OCEAN) 0.65 % SOLN nasal spray 1 spray, Each Nare, As needed   spironolactone (ALDACTONE) 25 mg, Oral, Daily    Diet Orders (From admission, onward)     Start     Ordered   09/27/22 0001  Diet NPO time specified  Diet effective midnight        09/25/22 1643            DVT prophylaxis: SCDs Start: 09/22/22 2210   Lab Results  Component Value Date   PLT 323 09/27/2022      Code Status: Full Code  Family Communication: no family at bedside   Status is: Observation The patient will require care spanning > 2 midnights and should be moved to inpatient because: Persistent GI symptoms, evaluation ongoing.  She also needs SNF   Level of care: Telemetry Medical  Consultants:  GI Cardiology   Objective: Vitals:   09/27/22 0459 09/27/22 0826 09/27/22 0933 09/27/22 0945  BP: (!) 152/65 (!) 140/63 117/72 (!) 148/61  Pulse: 65 61 (!) 55 (!) 54  Resp: 18 20 15 15   Temp: 97.6 F (36.4 C) 98.1 F (36.7 C) 98.4 F (36.9 C) 98.4 F (36.9 C)  TempSrc: Oral Temporal    SpO2: 97% 97% 98% 95%  Weight: 68 kg     Height:        Intake/Output Summary (Last 24 hours) at 09/27/2022 1000 Last data filed at 09/26/2022 2100 Gross per 24 hour  Intake 711 ml  Output 925 ml  Net -214 ml    Wt Readings from Last 3 Encounters:  09/27/22 68 kg  08/27/22 70.1 kg  08/15/22 68.9 kg    Examination:  Constitutional: NAD Eyes: lids and conjunctivae normal, no scleral icterus ENMT: mmm Neck: normal, supple Respiratory: clear to auscultation bilaterally, no wheezing, no crackles. Normal respiratory effort.  Cardiovascular: Regular rate and rhythm, no murmurs / rubs / gallops. No LE edema. Abdomen: soft, no distention, no  tenderness. Bowel sounds positive.  Skin: no rashes Neurologic: no focal deficits, equal strength   Data Reviewed: I have independently reviewed following labs and imaging studies   CBC Recent Labs  Lab 09/22/22 1705 09/22/22 1719 09/23/22 0940 09/24/22 0025 09/25/22 0046 09/26/22 0028 09/27/22 0041  WBC 12.5*  --  11.0* 12.4* 10.9* 9.3 10.5  HGB 9.8*   < > 10.1* 10.0* 10.0* 9.3* 8.7*  HCT 30.5*   < > 31.5* 32.2* 31.3* 28.6* 26.8*  PLT 339  --  352 354 364 319 323  MCV 93.0  --  92.9 94.2 91.5 91.7 92.4  MCH 29.9  --  29.8 29.2 29.2 29.8 30.0  MCHC 32.1  --  32.1 31.1 31.9 32.5 32.5  RDW 15.0  --  15.3 15.1 14.8 14.9 14.8  LYMPHSABS 1.3  --  1.1 1.2 1.6  --   --   MONOABS 0.9  --  0.9 1.1* 1.1*  --   --   EOSABS 0.1  --  0.2 0.3 0.3  --   --   BASOSABS 0.1  --  0.1 0.1 0.1  --   --    < > = values in this interval not displayed.     Recent Labs  Lab 09/22/22 1705 09/22/22 1719 09/22/22 1730 09/23/22 0201 09/24/22 0025 09/25/22 0046 09/26/22 0028 09/27/22 0041  NA 138   < >  --  137 137 135 137 138  K 3.5   < >  --  4.3 4.9 4.7 4.7 4.6  CL 101  --   --  100 102 100 103 105  CO2 26  --   --  26 27 26 23 25   GLUCOSE 102*  --   --  95 150* 113* 103* 103*  BUN 12  --   --  13 10 20 22  26*  CREATININE 0.89  --   --  0.94 0.89 1.02* 1.14* 1.31*  CALCIUM 8.9  --   --  8.7* 9.1 8.7* 8.6* 8.8*  AST 17  --   --   --  16 18  --   --   ALT 15  --   --   --  14 16  --   --   ALKPHOS 64  --   --   --  58 57  --   --   BILITOT 0.6  --   --   --  0.3 0.4  --   --   ALBUMIN 3.1*  --   --   --  3.3* 3.0*  --   --   MG  --   --   --  2.3 2.4 2.4 2.1  --   DDIMER <0.27  --   --   --   --   --   --   --   TSH  --   --   --   --  1.528  --   --   --   BNP  --   --  438.0*  --   --   --   --   --    < > = values in this interval not displayed.     ------------------------------------------------------------------------------------------------------------------ No results for  input(s): "CHOL", "HDL", "LDLCALC", "TRIG", "CHOLHDL", "LDLDIRECT" in the last 72 hours.  Lab Results  Component Value Date   HGBA1C 5.7 07/31/2022   ------------------------------------------------------------------------------------------------------------------ No results for input(s): "TSH", "T4TOTAL", "T3FREE", "THYROIDAB" in the last 72 hours.  Invalid input(s): "FREET3"   Cardiac Enzymes No results for input(s): "CKMB", "TROPONINI", "MYOGLOBIN" in the last 168 hours.  Invalid input(s): "CK" ------------------------------------------------------------------------------------------------------------------    Component Value Date/Time   BNP 438.0 (H) 09/22/2022 1730   BNP 89 02/12/2021 1506    CBG: No results for input(s): "GLUCAP" in the last 168 hours.  Recent Results (from the past 240 hour(s))  Resp Panel by RT-PCR (Flu A&B, Covid) Anterior Nasal Swab     Status: None   Collection Time: 09/22/22  5:28 PM   Specimen: Anterior Nasal Swab  Result Value Ref Range Status   SARS Coronavirus 2 by RT PCR NEGATIVE NEGATIVE Final    Comment: (NOTE) SARS-CoV-2 target nucleic acids are NOT DETECTED.  The SARS-CoV-2 RNA is generally detectable in upper respiratory specimens during the acute phase of infection. The lowest concentration of SARS-CoV-2 viral copies this assay can detect is 138 copies/mL. A negative result does not preclude SARS-Cov-2 infection and should not be used as the sole basis for treatment or  other patient management decisions. A negative result may occur with  improper specimen collection/handling, submission of specimen other than nasopharyngeal swab, presence of viral mutation(s) within the areas targeted by this assay, and inadequate number of viral copies(<138 copies/mL). A negative result must be combined with clinical observations, patient history, and epidemiological information. The expected result is Negative.  Fact Sheet for Patients:   BloggerCourse.com  Fact Sheet for Healthcare Providers:  SeriousBroker.it  This test is no t yet approved or cleared by the [place] FDA and  has been authorized for detection and/or diagnosis of SARS-CoV-2 by FDA under an Emergency Use Authorization (EUA). This EUA will remain  in effect (meaning this test can be used) for the duration of the COVID-19 declaration under Section 564(b)(1) of the Act, 21 U.S.C.section 360bbb-3(b)(1), unless the authorization is terminated  or revoked sooner.       Influenza A by PCR NEGATIVE NEGATIVE Final   Influenza B by PCR NEGATIVE NEGATIVE Final    Comment: (NOTE) The Xpert Xpress SARS-CoV-2/FLU/RSV plus assay is intended as an aid in the diagnosis of influenza from Nasopharyngeal swab specimens and should not be used as a sole basis for treatment. Nasal washings and aspirates are unacceptable for Xpert Xpress SARS-CoV-2/FLU/RSV testing.  Fact Sheet for Patients: BloggerCourse.com  Fact Sheet for Healthcare Providers: SeriousBroker.it  This test is not yet approved or cleared by the [place] FDA and has been authorized for detection and/or diagnosis of SARS-CoV-2 by FDA under an Emergency Use Authorization (EUA). This EUA will remain in effect (meaning this test can be used) for the duration of the COVID-19 declaration under Section 564(b)(1) of the Act, 21 U.S.C. section 360bbb-3(b)(1), unless the authorization is terminated or revoked.  Performed at [facility], [address], [place], [place] [address]      Radiology Studies: No results found.   Pamella Pert, MD, PhD [facility]  Between 7 am - 7 pm I am available, please contact me via Amion (for emergencies) or Securechat (non urgent messages)  Between 7 pm - 7 am I am not available, please contact night coverage MD/APP via Amion

## 2022-09-27 NOTE — Transfer of Care (Signed)
Immediate Anesthesia Transfer of Care Note  Patient: Julie Rice  Procedure(s) Performed: ESOPHAGOGASTRODUODENOSCOPY (EGD) WITH PROPOFOL BOTOX INJECTION  Patient Location: PACU  Anesthesia Type:MAC  Level of Consciousness: awake, alert , and oriented  Airway & Oxygen Therapy: Patient Spontanous Breathing and Patient connected to nasal cannula oxygen  Post-op Assessment: Report given to RN and Post -op Vital signs reviewed and stable  Post vital signs: Reviewed and stable  Last Vitals:  Vitals Value Taken Time  BP 117/72 09/27/22 0933  Temp 36.9 C 09/27/22 0933  Pulse 55 09/27/22 0934  Resp 15 09/27/22 0934  SpO2 97 % 09/27/22 0934  Vitals shown include unvalidated device data.  Last Pain:  Vitals:   09/27/22 0826  TempSrc: Temporal  PainSc: 0-No pain         Complications: No notable events documented.

## 2022-09-27 NOTE — Significant Event (Addendum)
Rapid Response Note   Reason for Call :  Jaw pain, complaint of headache, blurred vision Pt with acute hypertension (202/158) that resolved spontaneously.   Initial Focused Assessment:  Pt lying in bed, AO. Her sister in-law is visiting. She complains of sudden onset bilateral jaw pain. At onset her BP cycled and 202/158 was the reading. She denies chest pain or neck pain. She endorses that jaw pain comes and goes, but she has not experienced this prior to admission. She associated the headache with the jaw pain.   She endorses that last night and today she has had blurred vision in her right eye. She states it's "like a film is over my eye". EOMI. Visual fields intact. Pt is wearing her glasses at the time of my evaluation and the lens were clean.   She is in no distress. No focal deficit noted.   VS: BP 125/52, HR 61, RR 16, SpO2 95% room air  Interventions:  EKG  Plan of Care:  -Notify MD regarding jaw pain which induces headache, blurred vision in her right eye, and transient episode of HTN that self-resolved. -Continue to monitor and notify if symptoms worsen or new symptoms emerge  Event Summary:  MD Notified: Dr. Elvera Lennox, per RN Call Time: 1522 Arrival Time: 1528 End Time: 1535  Jennye Moccasin, RN

## 2022-09-28 ENCOUNTER — Observation Stay (HOSPITAL_COMMUNITY): Payer: Medicare Other

## 2022-09-28 ENCOUNTER — Inpatient Hospital Stay (HOSPITAL_COMMUNITY): Payer: Medicare Other

## 2022-09-28 DIAGNOSIS — M069 Rheumatoid arthritis, unspecified: Secondary | ICD-10-CM | POA: Diagnosis present

## 2022-09-28 DIAGNOSIS — I471 Supraventricular tachycardia, unspecified: Secondary | ICD-10-CM | POA: Diagnosis not present

## 2022-09-28 DIAGNOSIS — I4719 Other supraventricular tachycardia: Secondary | ICD-10-CM | POA: Diagnosis present

## 2022-09-28 DIAGNOSIS — G609 Hereditary and idiopathic neuropathy, unspecified: Secondary | ICD-10-CM | POA: Diagnosis present

## 2022-09-28 DIAGNOSIS — I63442 Cerebral infarction due to embolism of left cerebellar artery: Secondary | ICD-10-CM | POA: Diagnosis present

## 2022-09-28 DIAGNOSIS — I10 Essential (primary) hypertension: Secondary | ICD-10-CM | POA: Diagnosis not present

## 2022-09-28 DIAGNOSIS — I2721 Secondary pulmonary arterial hypertension: Secondary | ICD-10-CM | POA: Diagnosis present

## 2022-09-28 DIAGNOSIS — F411 Generalized anxiety disorder: Secondary | ICD-10-CM | POA: Diagnosis not present

## 2022-09-28 DIAGNOSIS — F339 Major depressive disorder, recurrent, unspecified: Secondary | ICD-10-CM | POA: Diagnosis present

## 2022-09-28 DIAGNOSIS — Z1152 Encounter for screening for COVID-19: Secondary | ICD-10-CM | POA: Diagnosis not present

## 2022-09-28 DIAGNOSIS — E039 Hypothyroidism, unspecified: Secondary | ICD-10-CM | POA: Diagnosis present

## 2022-09-28 DIAGNOSIS — I639 Cerebral infarction, unspecified: Secondary | ICD-10-CM

## 2022-09-28 DIAGNOSIS — R1314 Dysphagia, pharyngoesophageal phase: Secondary | ICD-10-CM | POA: Diagnosis present

## 2022-09-28 DIAGNOSIS — M316 Other giant cell arteritis: Secondary | ICD-10-CM | POA: Diagnosis not present

## 2022-09-28 DIAGNOSIS — J439 Emphysema, unspecified: Secondary | ICD-10-CM | POA: Diagnosis present

## 2022-09-28 DIAGNOSIS — D649 Anemia, unspecified: Secondary | ICD-10-CM | POA: Diagnosis present

## 2022-09-28 DIAGNOSIS — I499 Cardiac arrhythmia, unspecified: Secondary | ICD-10-CM | POA: Diagnosis not present

## 2022-09-28 DIAGNOSIS — I700 Atherosclerosis of aorta: Secondary | ICD-10-CM | POA: Diagnosis present

## 2022-09-28 DIAGNOSIS — Z7901 Long term (current) use of anticoagulants: Secondary | ICD-10-CM | POA: Diagnosis not present

## 2022-09-28 DIAGNOSIS — R531 Weakness: Secondary | ICD-10-CM

## 2022-09-28 DIAGNOSIS — J449 Chronic obstructive pulmonary disease, unspecified: Secondary | ICD-10-CM | POA: Diagnosis not present

## 2022-09-28 DIAGNOSIS — J9611 Chronic respiratory failure with hypoxia: Secondary | ICD-10-CM | POA: Diagnosis present

## 2022-09-28 DIAGNOSIS — I6523 Occlusion and stenosis of bilateral carotid arteries: Secondary | ICD-10-CM

## 2022-09-28 DIAGNOSIS — I110 Hypertensive heart disease with heart failure: Secondary | ICD-10-CM | POA: Diagnosis present

## 2022-09-28 DIAGNOSIS — N179 Acute kidney failure, unspecified: Secondary | ICD-10-CM | POA: Diagnosis present

## 2022-09-28 DIAGNOSIS — I493 Ventricular premature depolarization: Secondary | ICD-10-CM | POA: Diagnosis present

## 2022-09-28 DIAGNOSIS — I340 Nonrheumatic mitral (valve) insufficiency: Secondary | ICD-10-CM | POA: Diagnosis present

## 2022-09-28 DIAGNOSIS — K449 Diaphragmatic hernia without obstruction or gangrene: Secondary | ICD-10-CM | POA: Diagnosis present

## 2022-09-28 DIAGNOSIS — K2100 Gastro-esophageal reflux disease with esophagitis, without bleeding: Secondary | ICD-10-CM | POA: Diagnosis present

## 2022-09-28 DIAGNOSIS — I480 Paroxysmal atrial fibrillation: Secondary | ICD-10-CM | POA: Diagnosis present

## 2022-09-28 DIAGNOSIS — M810 Age-related osteoporosis without current pathological fracture: Secondary | ICD-10-CM | POA: Diagnosis present

## 2022-09-28 DIAGNOSIS — K220 Achalasia of cardia: Secondary | ICD-10-CM | POA: Diagnosis present

## 2022-09-28 DIAGNOSIS — K222 Esophageal obstruction: Secondary | ICD-10-CM | POA: Diagnosis present

## 2022-09-28 DIAGNOSIS — I6389 Other cerebral infarction: Secondary | ICD-10-CM

## 2022-09-28 DIAGNOSIS — I5032 Chronic diastolic (congestive) heart failure: Secondary | ICD-10-CM | POA: Diagnosis present

## 2022-09-28 LAB — BASIC METABOLIC PANEL
Anion gap: 11 (ref 5–15)
BUN: 19 mg/dL (ref 8–23)
CO2: 24 mmol/L (ref 22–32)
Calcium: 8.8 mg/dL — ABNORMAL LOW (ref 8.9–10.3)
Chloride: 104 mmol/L (ref 98–111)
Creatinine, Ser: 0.87 mg/dL (ref 0.44–1.00)
GFR, Estimated: >60 mL/min (ref >60)
Glucose, Bld: 88 mg/dL (ref 70–99)
Potassium: 4.2 mmol/L (ref 3.5–5.1)
Sodium: 139 mmol/L (ref 135–145)

## 2022-09-28 LAB — LIPID PANEL
Cholesterol: 175 mg/dL (ref 0–200)
HDL: 51 mg/dL (ref >40)
LDL Cholesterol: 99 mg/dL (ref 0–99)
Total CHOL/HDL Ratio: 3.4 RATIO
Triglycerides: 126 mg/dL (ref <150)
VLDL: 25 mg/dL (ref 0–40)

## 2022-09-28 LAB — ECHOCARDIOGRAM COMPLETE BUBBLE STUDY
Area-P 1/2: 2.99 cm2
MV M vel: 5.02 m/s
MV Peak grad: 100.8 mmHg
P 1/2 time: 680 msec
S' Lateral: 2.3 cm

## 2022-09-28 LAB — C-REACTIVE PROTEIN: CRP: 0.6 mg/dL (ref <1.0)

## 2022-09-28 LAB — SEDIMENTATION RATE: Sed Rate: 30 mm/hr — ABNORMAL HIGH (ref 0–22)

## 2022-09-28 MED ORDER — APIXABAN 5 MG PO TABS
5.0000 mg | ORAL_TABLET | Freq: Two times a day (BID) | ORAL | Status: DC
  Administered 2022-09-28 – 2022-09-29 (×3): 5 mg via ORAL
  Filled 2022-09-28 (×3): qty 1

## 2022-09-28 MED ORDER — ROSUVASTATIN CALCIUM 20 MG PO TABS
20.0000 mg | ORAL_TABLET | Freq: Every day | ORAL | Status: DC
  Administered 2022-09-28: 20 mg via ORAL
  Filled 2022-09-28: qty 1

## 2022-09-28 MED ORDER — METOPROLOL TARTRATE 12.5 MG HALF TABLET
12.5000 mg | ORAL_TABLET | Freq: Two times a day (BID) | ORAL | Status: DC
  Administered 2022-09-28 – 2022-09-29 (×3): 12.5 mg via ORAL
  Filled 2022-09-28 (×3): qty 1

## 2022-09-28 NOTE — Progress Notes (Signed)
PROGRESS NOTE  Julie Rice UVO:[id number] DOB: 1936-09-16 DOA: 09/22/2022 PCP: Shirline Frees, NP   LOS: 0 days   Brief Narrative / Interim history: 86 year old with HTN, RA, chronic diastolic CHF, COPD, chronic respiratory failure at home on 2L at home, esophageal strictures, PAF, CAD who comes to the hospital with complaints of chest burning and palpitations.  On telemetry she was found to have wide-complex tachycardia and cardiology was consulted.  Hospital course complicated by persistent dysphagia requiring GI consultation status post EGD, as well as weakness and severe hypertension following the EGD with an MRI showing an acute CVA.  Subjective / 24h Interval events: Doing well this morning, awaiting EGD  Assesement and Plan: Principal Problem:   Arrhythmia Active Problems:   Major depressive disorder, recurrent (HCC)   Generalized anxiety disorder   Hypothyroidism   Dyslipidemia   Essential hypertension   ESOPHAGEAL STRICTURE   Esophageal stricture   (HFpEF) heart failure with preserved ejection fraction (HCC)   Prolonged QT interval   Chronic respiratory failure with hypoxia (HCC)   COPD (chronic obstructive pulmonary disease) (HCC)   PAF (paroxysmal atrial fibrillation) (HCC)   Chronic pain syndrome   Myofascial pain   Rheumatoid arthritis involving multiple sites (HCC)   Paroxysmal SVT (supraventricular tachycardia)   Idiopathic peripheral neuropathy   Generalized weakness  Principal problem SVT / PAF - with wide complex tachycardia.  Cardiology/EP consulted, evaluated patient and she was now started on amiodarone, continue along with metoprolol.  Rates much better controlled -Resume Eliquis today.  Active problems Acute CVA-noticed on MRI 11/24, small acute infarct in the left inferior cerebellum.  Neurology consulted, appreciate input. Discussed with Dr. Roda Shutters.  This may have happened while she was off Eliquis in the past 2 days, in preparation for EGD.  MRA  without significant LVO.  Workup pending with echo, carotids.  Lipid panel shows an LDL of 95.  A1c pending.  PT already recommending SNF  Dysphagia, chronic esophageal strictures-she has been having progressive symptoms, GI consulted, appreciate input.  She underwent an endoscopy this morning which showed benign-appearing esophageal stenosis status post botulinum toxin injection.  Did not underwent dilatation due to a degree of esophagitis.  Resume Eliquis today  Blurry vision-on and off, neurology overnight concerned about temporal arteritis.  CRP not elevated, sed rate slightly high.  Temporal artery ultrasound pending  Mild AKI-blood work today pending  Chronic diastolic CHF-appears euvolemic  HTN-on metoprolol, blood pressure stable.  Spironolactone and losartan are on hold, continue to hold due to slight creatinine elevation and acute CVA  GAD-continue Wellbutrin, Klonopin, duloxetine  COPD with chronic hypoxic respiratory failure-wears oxygen at night only.  Sees Dr. Sherene Sires as an outpatient  Leukocytosis-white count stable  Mild normocytic anemia-of chronic disease   Acquired Scheduled Meds:  amiodarone  200 mg Oral Daily   apixaban  5 mg Oral BID   buPROPion  300 mg Oral Daily   clonazePAM  0.5 mg Oral BID   DULoxetine  60 mg Oral Daily   loratadine  10 mg Oral Daily   metoprolol tartrate  12.5 mg Oral BID   pantoprazole  40 mg Oral Daily   pregabalin  50 mg Oral Daily   Continuous Infusions:  PRN Meds:.acetaminophen **OR** acetaminophen, albuterol, bismuth subsalicylate, senna-docusate  Current Outpatient Medications  Medication Instructions   acetaminophen (TYLENOL) 1,000 mg, Oral, Every 6 hours PRN   albuterol (VENTOLIN HFA) 108 (90 Base) MCG/ACT inhaler TAKE 2 PUFFS BY MOUTH EVERY 6 HOURS AS NEEDED  FOR WHEEZE OR SHORTNESS OF BREATH   buPROPion (WELLBUTRIN XL) 300 mg, Oral, Daily   Carboxymethylcellulose Sodium (REFRESH TEARS OP) 1 drop, Both Eyes, 2 times daily    cetirizine (ZYRTEC) 10 mg, Oral, Daily PRN   clonazePAM (KLONOPIN) 0.25 mg, Oral, 2 times daily PRN   DULoxetine (CYMBALTA) 60 MG capsule TAKE 1 CAPSULE BY MOUTH EVERY DAY   ELIQUIS 5 MG TABS tablet TAKE 1 TABLET BY MOUTH TWICE A DAY   FeFum-FePoly-FA-B Cmp-C-Biot (INTEGRA PLUS) CAPS 1 capsule, Oral, Every morning   KLOR-CON M20 20 MEQ tablet 20 mEq, Oral, Daily   losartan (COZAAR) 50 mg, Oral, Daily   metoprolol tartrate (LOPRESSOR) 75 mg, Oral, 2 times daily   Multiple Vitamins-Minerals (ONE-A-DAY WOMENS 50+) TABS 1 tablet, Oral, Daily   omeprazole (PRILOSEC) 20 MG capsule TAKE 1 CAPSULE BY MOUTH EVERY DAY   pregabalin (LYRICA) 50 MG capsule Oral, Daily   sodium chloride (OCEAN) 0.65 % SOLN nasal spray 1 spray, Each Nare, As needed   spironolactone (ALDACTONE) 25 mg, Oral, Daily    Diet Orders (From admission, onward)     Start     Ordered   09/27/22 1003  Diet regular Room service appropriate? Yes; Fluid consistency: Thin  Diet effective now       Question Answer Comment  Room service appropriate? Yes   Fluid consistency: Thin      09/27/22 1002            DVT prophylaxis: SCDs Start: 09/22/22 2210 apixaban (ELIQUIS) tablet 5 mg   Lab Results  Component Value Date   PLT 323 09/27/2022      Code Status: Full Code  Family Communication: no family at bedside   Status is: Observation The patient will require care spanning > 2 midnights and should be moved to inpatient because: Acute CVA   Level of care: Telemetry Medical  Consultants:  GI Cardiology   Objective: Vitals:   09/28/22 0410 09/28/22 0728 09/28/22 0845 09/28/22 0849  BP: (!) 159/69 (!) 154/53 129/75 (!) 129/7  Pulse: 61 60 66   Resp: 18 18 16    Temp: 97.6 F (36.4 C)     TempSrc: Oral     SpO2: 98% 95% 99%   Weight: 69.1 kg     Height:        Intake/Output Summary (Last 24 hours) at 09/28/2022 0946 Last data filed at 09/28/2022 0856 Gross per 24 hour  Intake 970 ml  Output 1850 ml  Net  -880 ml    Wt Readings from Last 3 Encounters:  09/28/22 69.1 kg  08/27/22 70.1 kg  08/15/22 68.9 kg    Examination:  Constitutional: NAD Eyes: lids and conjunctivae normal, no scleral icterus ENMT: mmm Neck: normal, supple Respiratory: clear to auscultation bilaterally, no wheezing, no crackles. Normal respiratory effort.  Cardiovascular: Regular rate and rhythm, no murmurs / rubs / gallops. No LE edema. Abdomen: soft, no distention, no tenderness. Bowel sounds positive.  Skin: no rashes Neurologic: no focal deficits, equal strength   Data Reviewed: I have independently reviewed following labs and imaging studies   CBC Recent Labs  Lab 09/22/22 1705 09/22/22 1719 09/23/22 0940 09/24/22 0025 09/25/22 0046 09/26/22 0028 09/27/22 0041  WBC 12.5*  --  11.0* 12.4* 10.9* 9.3 10.5  HGB 9.8*   < > 10.1* 10.0* 10.0* 9.3* 8.7*  HCT 30.5*   < > 31.5* 32.2* 31.3* 28.6* 26.8*  PLT 339  --  352 354 364 319 323  MCV  93.0  --  92.9 94.2 91.5 91.7 92.4  MCH 29.9  --  29.8 29.2 29.2 29.8 30.0  MCHC 32.1  --  32.1 31.1 31.9 32.5 32.5  RDW 15.0  --  15.3 15.1 14.8 14.9 14.8  LYMPHSABS 1.3  --  1.1 1.2 1.6  --   --   MONOABS 0.9  --  0.9 1.1* 1.1*  --   --   EOSABS 0.1  --  0.2 0.3 0.3  --   --   BASOSABS 0.1  --  0.1 0.1 0.1  --   --    < > = values in this interval not displayed.     Recent Labs  Lab 09/22/22 1705 09/22/22 1719 09/22/22 1730 09/23/22 0201 09/24/22 0025 09/25/22 0046 09/26/22 0028 09/27/22 0041 09/28/22 0656  NA 138   < >  --  137 137 135 137 138  --   K 3.5   < >  --  4.3 4.9 4.7 4.7 4.6  --   CL 101  --   --  100 102 100 103 105  --   CO2 26  --   --  26 27 26 23 25   --   GLUCOSE 102*  --   --  95 150* 113* 103* 103*  --   BUN 12  --   --  13 10 20 22  26*  --   CREATININE 0.89  --   --  0.94 0.89 1.02* 1.14* 1.31*  --   CALCIUM 8.9  --   --  8.7* 9.1 8.7* 8.6* 8.8*  --   AST 17  --   --   --  16 18  --   --   --   ALT 15  --   --   --  14 16  --    --   --   ALKPHOS 64  --   --   --  58 57  --   --   --   BILITOT 0.6  --   --   --  0.3 0.4  --   --   --   ALBUMIN 3.1*  --   --   --  3.3* 3.0*  --   --   --   MG  --   --   --  2.3 2.4 2.4 2.1  --   --   CRP  --   --   --   --   --   --   --   --  0.6  DDIMER <0.27  --   --   --   --   --   --   --   --   TSH  --   --   --   --  1.528  --   --   --   --   BNP  --   --  438.0*  --   --   --   --   --   --    < > = values in this interval not displayed.     ------------------------------------------------------------------------------------------------------------------ Recent Labs    09/28/22 0656  CHOL 175  HDL 51  LDLCALC 99  TRIG 126  CHOLHDL 3.4    Lab Results  Component Value Date   HGBA1C 5.7 07/31/2022   ------------------------------------------------------------------------------------------------------------------ No results for input(s): "TSH", "T4TOTAL", "T3FREE", "THYROIDAB" in the last 72 hours.  Invalid input(s): "FREET3"   Cardiac Enzymes No results  for input(s): "CKMB", "TROPONINI", "MYOGLOBIN" in the last 168 hours.  Invalid input(s): "CK" ------------------------------------------------------------------------------------------------------------------    Component Value Date/Time   BNP 438.0 (H) 09/22/2022 1730   BNP 89 02/12/2021 1506    CBG: No results for input(s): "GLUCAP" in the last 168 hours.  Recent Results (from the past 240 hour(s))  Resp Panel by RT-PCR (Flu A&B, Covid) Anterior Nasal Swab     Status: None   Collection Time: 09/22/22  5:28 PM   Specimen: Anterior Nasal Swab  Result Value Ref Range Status   SARS Coronavirus 2 by RT PCR NEGATIVE NEGATIVE Final    Comment: (NOTE) SARS-CoV-2 target nucleic acids are NOT DETECTED.  The SARS-CoV-2 RNA is generally detectable in upper respiratory specimens during the acute phase of infection. The lowest concentration of SARS-CoV-2 viral copies this assay can detect is 138  copies/mL. A negative result does not preclude SARS-Cov-2 infection and should not be used as the sole basis for treatment or other patient management decisions. A negative result may occur with  improper specimen collection/handling, submission of specimen other than nasopharyngeal swab, presence of viral mutation(s) within the areas targeted by this assay, and inadequate number of viral copies(<138 copies/mL). A negative result must be combined with clinical observations, patient history, and epidemiological information. The expected result is Negative.  Fact Sheet for Patients:  BloggerCourse.com  Fact Sheet for Healthcare Providers:  SeriousBroker.it  This test is no t yet approved or cleared by the [place] FDA and  has been authorized for detection and/or diagnosis of SARS-CoV-2 by FDA under an Emergency Use Authorization (EUA). This EUA will remain  in effect (meaning this test can be used) for the duration of the COVID-19 declaration under Section 564(b)(1) of the Act, 21 U.S.C.section 360bbb-3(b)(1), unless the authorization is terminated  or revoked sooner.       Influenza A by PCR NEGATIVE NEGATIVE Final   Influenza B by PCR NEGATIVE NEGATIVE Final    Comment: (NOTE) The Xpert Xpress SARS-CoV-2/FLU/RSV plus assay is intended as an aid in the diagnosis of influenza from Nasopharyngeal swab specimens and should not be used as a sole basis for treatment. Nasal washings and aspirates are unacceptable for Xpert Xpress SARS-CoV-2/FLU/RSV testing.  Fact Sheet for Patients: BloggerCourse.com  Fact Sheet for Healthcare Providers: SeriousBroker.it  This test is not yet approved or cleared by the [place] FDA and has been authorized for detection and/or diagnosis of SARS-CoV-2 by FDA under an Emergency Use Authorization (EUA). This EUA will remain in effect (meaning  this test can be used) for the duration of the COVID-19 declaration under Section 564(b)(1) of the Act, 21 U.S.C. section 360bbb-3(b)(1), unless the authorization is terminated or revoked.  Performed at [facility], [address], [place], [place] [address]      Radiology Studies: MR ANGIO HEAD WO CONTRAST  Result Date: 09/28/2022 CLINICAL DATA:  86 year old female with neurologic deficit and small left cerebellar lacunar type infarct on MRI yesterday. EXAM: MRA HEAD WITHOUT CONTRAST TECHNIQUE: Angiographic images of the Circle of Willis were acquired using MRA technique without intravenous contrast. COMPARISON:  Brain MRI 09/27/2022. FINDINGS: Anterior circulation: Antegrade flow in both ICA siphons. Mild siphon irregularity. No significant siphon stenosis. Patent carotid termini, MCA and ACA origins. Visible ACA branches are normal aside from tortuosity. Left MCA M1 and trifurcation appears patent without stenosis. Right MCA M1 and bifurcation appears patent with mild irregularity and stenosis. Visible bilateral MCA branches are within normal limits. Posterior circulation:  Antegrade flow in the posterior circulation with codominant distal vertebral arteries. No distal vertebral or vertebrobasilar junction. Right PICA origin and proximal vessel is patent. Left PICA appears patent. Patent basilar artery without stenosis. SCA and PCA origins are within normal limits. Tortuous left P1. Mild bilateral P1/P2 irregularity and stenosis. Posterior communicating arteries are diminutive or absent. Left PCA branches are within normal limits. Anatomic variants: None. Other: No intracranial mass effect or ventriculomegaly. IMPRESSION: Mild for age intracranial atherosclerosis. Negative for large vessel occlusion or significant posterior circulation stenosis. The proximal right PICA remains patent. Electronically Signed   By: Odessa Fleming M.D.   On: 09/28/2022 09:02   MR BRAIN WO CONTRAST  Result Date:  09/27/2022 CLINICAL DATA:  Monocular vision loss, waxing and waning, blurry vision EXAM: MRI HEAD WITHOUT CONTRAST TECHNIQUE: Multiplanar, multiecho pulse sequences of the brain and surrounding structures were obtained without intravenous contrast. COMPARISON:  09/27/2020 MRI head FINDINGS: Brain: Restricted diffusion with ADC correlate in the left inferior cerebellum (series 2, image 13 and series 5, image 8), consistent with small acute infarct. No acute hemorrhage, mass, mass effect, or midline shift. No hydrocephalus or extra-axial collection. Confluent and scattered T2 hyperintense signal in the periventricular white matter, likely the sequela of moderate chronic small vessel ischemic disease. No hemosiderin deposition to suggest remote hemorrhage. Remote lacunar infarcts in the right basal ganglia and left cerebellum, which were present in 2021. Vascular: Normal arterial flow voids. Skull and upper cervical spine: Normal marrow signal. Sinuses/Orbits: Air-fluid level and mucosal thickening in the right maxillary sinus. Otherwise clear paranasal sinuses. Status post bilateral lens replacements. Other: The mastoids are well aerated. IMPRESSION: Small acute infarct in the left inferior cerebellum. These results will be called to the ordering clinician or representative by the Radiologist Assistant, and communication documented in the PACS or [employer]. Electronically Signed   By: Wiliam Ke M.D.   On: 09/27/2022 22:34     Pamella Pert, MD, PhD [facility]  Between 7 am - 7 pm I am available, please contact me via Amion (for emergencies) or Securechat (non urgent messages)  Between 7 pm - 7 am I am not available, please contact night coverage MD/APP via Amion

## 2022-09-28 NOTE — Progress Notes (Signed)
MRI brain ordered on 09/27/2022 by primary dayshift team, due to right eye vision changes, revealed a small acute infarct involving left inferior cerebellum.    Presented at bedside.  The patient is alert and answering to questions appropriately.  No significant motor deficits noted.  Per the patient, she noted right eye vision changes about a week ago.  Symptoms have been intermittent on and off.  History of paroxysmal A-fib on Eliquis.  Eliquis held due to planned EGD.  Plan to restart Eliquis on 09/28/2022.  Ongoing permissive hypertension.  Frequent neurochecks.  2D echo with bubble study.  Fasting lipid panel/A1c.  Neurology consulted to assist with the management.  Imaging of head and neck deferred to neurology.  Time: 25 minutes.

## 2022-09-28 NOTE — Progress Notes (Signed)
Carotid duplex bilateral and temporal artery duplex bilateral study completed.   Please see CV Proc for preliminary results.   Jean Rosenthal, RDMS, RVT

## 2022-09-28 NOTE — Progress Notes (Addendum)
STROKE TEAM PROGRESS NOTE   SUBJECTIVE (INTERVAL HISTORY) Her RN is at the bedside.  Overall her condition is stable. She stated that her right eye vision changes intermittent, mild with fuzzy images, lasting 10-15 min each, sometimes once the other day and sometimes two days in a row. This has been going on for 2-3 years and seems more frequent than before. She also stated she has right temporoparietal HA, like "growing horn on the head". Not related to vision disturbance. No frequent HA, but a little more tense the last two weeks. She has b/l jaw soreness at baseline, not related to eating or chewing. She also reported left shoulder soreness.    OBJECTIVE Temp:  [97.6 F (36.4 C)-98.2 F (36.8 C)] 98.2 F (36.8 C) (11/25 1746) Pulse Rate:  [60-69] 69 (11/25 1746) Cardiac Rhythm: Sinus bradycardia;Normal sinus rhythm (11/25 0719) Resp:  [16-18] 16 (11/25 0845) BP: (119-159)/(53-105) 145/63 (11/25 1746) SpO2:  [95 %-100 %] 100 % (11/25 1746) Weight:  [69.1 kg] 69.1 kg (11/25 0410)  No results for input(s): "GLUCAP" in the last 168 hours. Recent Labs  Lab 09/23/22 0201 09/23/22 0940 09/24/22 0025 09/25/22 0046 09/26/22 0028 09/27/22 0041 09/28/22 0712  NA 137  --  137 135 137 138 139  K 4.3  --  4.9 4.7 4.7 4.6 4.2  CL 100  --  102 100 103 105 104  CO2 26  --  _0 GLUCOSE 95  --  150* 113* 103* 103* 88  BUN 13  --  _1 26* 19  CREATININE 0.94  --  0.89 1.02* 1.14* 1.31* 0.87  CALCIUM 8.7*  --  9.1 8.7* 8.6* 8.8* 8.8*  MG 2.3  --  2.4 2.4 2.1  --   --   PHOS  --  2.9 3.4 4.1  --   --   --    Recent Labs  Lab 09/22/22 1705 09/24/22 0025 09/25/22 0046  AST _2 ALT _3 ALKPHOS 64 58 57  BILITOT 0.6 0.3 0.4  PROT 5.8* 5.8* 5.7*  ALBUMIN 3.1* 3.3* 3.0*   Recent Labs  Lab 09/22/22 1705 09/22/22 1719 09/23/22 0940 09/24/22 0025 09/25/22 0046 09/26/22 0028 09/27/22 0041  WBC 12.5*  --  11.0* 12.4* 10.9* 9.3 10.5  NEUTROABS 10.2*  --   8.7* 9.7* 7.8*  --   --   HGB 9.8*   < > 10.1* 10.0* 10.0* 9.3* 8.7*  HCT 30.5*   < > 31.5* 32.2* 31.3* 28.6* 26.8*  MCV 93.0  --  92.9 94.2 91.5 91.7 92.4  PLT 339  --  352 354 364 319 323   < > = values in this interval not displayed.   No results for input(s): "CKTOTAL", "CKMB", "CKMBINDEX", "TROPONINI" in the last 168 hours. No results for input(s): "LABPROT", "INR" in the last 72 hours. No results for input(s): "COLORURINE", "LABSPEC", "PHURINE", "GLUCOSEU", "HGBUR", "BILIRUBINUR", "KETONESUR", "PROTEINUR", "UROBILINOGEN", "NITRITE", "LEUKOCYTESUR" in the last 72 hours.  Invalid input(s): "APPERANCEUR"     Component Value Date/Time   CHOL 175 09/28/2022 0656   TRIG 126 09/28/2022 0656   HDL 51 09/28/2022 0656   CHOLHDL 3.4 09/28/2022 0656   VLDL 25 09/28/2022 0656   LDLCALC 99 09/28/2022 0656   Lab Results  Component Value Date   HGBA1C 5.7 07/31/2022      Component Value Date/Time   LABOPIA NONE DETECTED 02/05/2012 0058   COCAINSCRNUR NONE DETECTED 02/05/2012 0058  LABBENZ POSITIVE (A) 02/05/2012 0058   AMPHETMU NONE DETECTED 02/05/2012 0058   THCU NONE DETECTED 02/05/2012 0058   LABBARB NONE DETECTED 02/05/2012 0058    No results for input(s): "ETH" in the last 168 hours.  I have personally reviewed the radiological images below and agree with the radiology interpretations.  ECHOCARDIOGRAM COMPLETE BUBBLE STUDY  Result Date: 09/28/2022    ECHOCARDIOGRAM REPORT   Patient Name:   Julie Rice Date of Exam: 09/28/2022 Medical Rec #:  [id number]        Height:       62.0 in Accession #:    [id number]       Weight:       152.4 lb Date of Birth:  August 04, 1936         BSA:          1.703 m Patient Age:    86 years         BP:           121/99 mmHg Patient Gender: F                HR:           59 bpm. Exam Location:  Inpatient Procedure: 2D Echo, Cardiac Doppler and Color Doppler Indications:    I63.9 Stroke  History:        Patient has prior history of Echocardiogram  examinations, most                 recent 03/15/2022. Cardiomyopathy and CHF, CAD, COPD; Risk                 Factors:Hypertension and Dyslipidemia. (HFpEF) heart failure                 with preserved ejection fraction ([place]), PAF (paroxysmal atrial                 fibrillation), Transient cerebral ischemia.  Sonographer:    Alvino Chapel RCS Referring Phys: [id number] [place]  1. Left ventricular ejection fraction, by estimation, is 60 to 65%. The left ventricle has normal function. The left ventricle has no regional wall motion abnormalities. There is mild concentric left ventricular hypertrophy. Left ventricular diastolic parameters are consistent with Grade II diastolic dysfunction (pseudonormalization).  2. Right ventricular systolic function is normal. The right ventricular size is normal. There is moderately elevated pulmonary artery systolic pressure. The estimated right ventricular systolic pressure is 26.3 mmHg.  3. Left atrial size was severely dilated.  4. Right atrial size was mildly dilated.  5. The mitral valve is degenerative, restricted posterior leaflet associated with moderate annular calcification. Moderate mitral valve regurgitation, Carpentier class IIIb. Moderate mitral annular calcification.  6. Tricuspid valve regurgitation is moderate.  7. The aortic valve is tricuspid. Aortic valve regurgitation is mild. Aortic regurgitation PHT measures 680 msec.  8. The inferior vena cava is normal in size with greater than 50% respiratory variability, suggesting right atrial pressure of 3 mmHg.  9. Agitated saline contrast bubble study was negative, with no evidence of any interatrial shunt. Comparison(s): Prior images reviewed side by side. LVEF more vigorous in comparison. Mitral regurgitation is moderate. RVSP moderately elevated. FINDINGS  Left Ventricle: Left ventricular ejection fraction, by estimation, is 60 to 65%. The left ventricle has normal function. The left ventricle has no  regional wall motion abnormalities. The left ventricular internal cavity size was normal in size. There is  mild concentric left ventricular hypertrophy. Left ventricular  diastolic parameters are consistent with Grade II diastolic dysfunction (pseudonormalization). Right Ventricle: The right ventricular size is normal. No increase in right ventricular wall thickness. Right ventricular systolic function is normal. There is moderately elevated pulmonary artery systolic pressure. The tricuspid regurgitant velocity is 3.38 m/s, and with an assumed right atrial pressure of 3 mmHg, the estimated right ventricular systolic pressure is 13.2 mmHg. Left Atrium: Left atrial size was severely dilated. Right Atrium: Right atrial size was mildly dilated. Pericardium: There is no evidence of pericardial effusion. Mitral Valve: The mitral valve is degenerative in appearance. Moderate mitral annular calcification. Moderate mitral valve regurgitation. Tricuspid Valve: The tricuspid valve is grossly normal. Tricuspid valve regurgitation is moderate. Aortic Valve: The aortic valve is tricuspid. There is mild aortic valve annular calcification. Aortic valve regurgitation is mild. Aortic regurgitation PHT measures 680 msec. Pulmonic Valve: The pulmonic valve was grossly normal. Pulmonic valve regurgitation is trivial. Aorta: The aortic root is normal in size and structure. Venous: The inferior vena cava is normal in size with greater than 50% respiratory variability, suggesting right atrial pressure of 3 mmHg. IAS/Shunts: No atrial level shunt detected by color flow Doppler. Agitated saline contrast was given intravenously to evaluate for intracardiac shunting. Agitated saline contrast bubble study was negative, with no evidence of any interatrial shunt.  LEFT VENTRICLE PLAX 2D LVIDd:         4.30 cm   Diastology LVIDs:         2.30 cm   LV e' medial:    5.33 cm/s LV PW:         1.20 cm   LV E/e' medial:  23.8 LV IVS:        1.20 cm   LV  e' lateral:   5.55 cm/s LVOT diam:     1.55 cm   LV E/e' lateral: 22.9 LV SV:         46 LV SV Index:   27 LVOT Area:     1.89 cm  RIGHT VENTRICLE RV S prime:     15.60 cm/s TAPSE (M-mode): 2.5 cm LEFT ATRIUM              Index        RIGHT ATRIUM           Index LA diam:        4.70 cm  2.76 cm/m   RA Area:     23.30 cm LA Vol (A2C):   134.0 ml 78.68 ml/m  RA Volume:   62.80 ml  36.87 ml/m LA Vol (A4C):   120.0 ml 70.46 ml/m LA Biplane Vol: 129.0 ml 75.74 ml/m  AORTIC VALVE LVOT Vmax:   101.00 cm/s LVOT Vmean:  77.700 cm/s LVOT VTI:    0.246 m AI PHT:      680 msec  AORTA Ao Root diam: 3.10 cm MITRAL VALVE                TRICUSPID VALVE MV Area (PHT): 2.99 cm     TR Peak grad:   45.7 mmHg MV Decel Time: 254 msec     TR Vmax:        338.00 cm/s MR Peak grad: 100.8 mmHg MR Mean grad: 63.0 mmHg     SHUNTS MR Vmax:      502.00 cm/s   Systemic VTI:  0.25 m MR Vmean:     371.0 cm/s    Systemic Diam: 1.55 cm MV E velocity: 127.00 cm/s MV A velocity:  106.00 cm/s MV E/A ratio:  1.20 Rozann Lesches MD Electronically signed by Rozann Lesches MD Signature Date/Time: 09/28/2022/1:45:24 PM    Final    VAS [place] TEMPORAL ARTERY BILATERAL  Result Date: 09/28/2022  TEMPORAL ARTERY REPORT Patient Name:  Julie Rice  Date of Exam:   09/28/2022 Medical Rec #: [id number]         Accession #:    [id number] Date of Birth: [birth year]          Patient Gender: F Patient Age:   53 years Exam Location:  [facility] Procedure:      VAS [place] ARTERY BILATERAL Referring Phys: Alferd Patee [facility] --------------------------------------------------------------------------------  Indications: Blurred vision , jaw pain and headache. High Risk Factors: Age > 42 yrs and female.  Comparison Study: No prior studies. Performing Technologist: Darlin Coco RDMS, RVT  Examination Guidelines: Patient in reclined position. 2D, color and spectral doppler sampling in the temporal artery along the hairline and temple in the longitudinal  plane. 2D images along the hairline and temple in the transverse plane. Exam is bilateral.  Summary: Absence of a "halo" sign in the bilateral temporal artery, although not definitive, makes a diagnosis of temporal arteritis unlikely. Today's examination was limited secondary to small caliber of vessels. The left parietal artery was not visualized.  *See table(s) above for measurements and observations.    Preliminary    VAS US CAROTID  Result Date: 09/28/2022 Carotid Arterial Duplex Study Patient Name:  Julie Rice  Date of Exam:   09/28/2022 Medical Rec #: [id number]         Accession #:    [id number] Date of Birth: 23-May-1936          Patient Gender: F Patient Age:   41 years Exam Location[facility]  [facility] Procedure:      VAS US CAROTID Referring Phys: Alferd Patee [facility] --------------------------------------------------------------------------------  Indications:       CVA. Risk Factors:      Hypertension, hyperlipidemia, no history of smoking, coronary                    artery disease. Comparison Study:  No prior studies. Performing Technologist: Darlin Coco RDMS, RVT  Examination Guidelines: A complete evaluation includes B-mode imaging, spectral Doppler, color Doppler, and power Doppler as needed of all accessible portions of each vessel. Bilateral testing is considered an integral part of a complete examination. Limited examinations for reoccurring indications may be performed as noted.  Right Carotid Findings: +----------+--------+--------+--------+----------------------------+--------+           PSV cm/sEDV cm/sStenosisPlaque Description          Comments +----------+--------+--------+--------+----------------------------+--------+ CCA Prox  89      9                                                    +----------+--------+--------+--------+----------------------------+--------+ CCA Distal56      10                                                    +----------+--------+--------+--------+----------------------------+--------+ ICA Prox  56      12      1-39%   hyperechoic and heterogenous         +----------+--------+--------+--------+----------------------------+--------+  ICA Mid   76      19                                          tortuous +----------+--------+--------+--------+----------------------------+--------+ ICA Distal88      17                                          tortuous +----------+--------+--------+--------+----------------------------+--------+ ECA       46                                                           +----------+--------+--------+--------+----------------------------+--------+ +----------+--------+-------+----------------+-------------------+           PSV cm/sEDV cmsDescribe        Arm Pressure (mmHG) +----------+--------+-------+----------------+-------------------+ [id number]            Multiphasic, WNL                    +----------+--------+-------+----------------+-------------------+ +---------+--------+--+--------+-+---------+ VertebralPSV cm/s51EDV cm/s8Antegrade +---------+--------+--+--------+-+---------+  Left Carotid Findings: +----------+--------+--------+--------+----------------------------+--------+           PSV cm/sEDV cm/sStenosisPlaque Description          Comments +----------+--------+--------+--------+----------------------------+--------+ CCA Prox  81      12      <50%    hyperechoic and smooth               +----------+--------+--------+--------+----------------------------+--------+ CCA Distal71      10                                                   +----------+--------+--------+--------+----------------------------+--------+ ICA Prox  54      10      1-39%   hyperechoic and heterogenous         +----------+--------+--------+--------+----------------------------+--------+ ICA Mid   127     26                                           tortuous +----------+--------+--------+--------+----------------------------+--------+ ICA Distal68      20                                          tortuous +----------+--------+--------+--------+----------------------------+--------+ ECA       68                                                           +----------+--------+--------+--------+----------------------------+--------+ +----------+--------+--------+----------------+-------------------+           PSV cm/sEDV cm/sDescribe        Arm Pressure (mmHG) +----------+--------+--------+----------------+-------------------+ [id number]  Multiphasic, WNL                    +----------+--------+--------+----------------+-------------------+ +---------+--------+--+--------+--+---------+ VertebralPSV cm/s53EDV cm/s13Antegrade +---------+--------+--+--------+--+---------+   Summary: Right Carotid: Velocities in the right ICA are consistent with a 1-39% stenosis. Left Carotid: Velocities in the left ICA are consistent with a 1-39% stenosis.               Non-hemodynamically significant plaque <50% noted in the CCA. Vertebrals:  Bilateral vertebral arteries demonstrate antegrade flow. Subclavians: Normal flow hemodynamics were seen in bilateral subclavian              arteries. *See table(s) above for measurements and observations.     Preliminary    MR ANGIO HEAD WO CONTRAST  Result Date: 09/28/2022 CLINICAL DATA:  86 year old female with neurologic deficit and small left cerebellar lacunar type infarct on MRI yesterday. EXAM: MRA HEAD WITHOUT CONTRAST TECHNIQUE: Angiographic images of the Circle of Willis were acquired using MRA technique without intravenous contrast. COMPARISON:  Brain MRI 09/27/2022. FINDINGS: Anterior circulation: Antegrade flow in both ICA siphons. Mild siphon irregularity. No significant siphon stenosis. Patent carotid termini, MCA and ACA origins. Visible ACA branches are normal  aside from tortuosity. Left MCA M1 and trifurcation appears patent without stenosis. Right MCA M1 and bifurcation appears patent with mild irregularity and stenosis. Visible bilateral MCA branches are within normal limits. Posterior circulation: Antegrade flow in the posterior circulation with codominant distal vertebral arteries. No distal vertebral or vertebrobasilar junction. Right PICA origin and proximal vessel is patent. Left PICA appears patent. Patent basilar artery without stenosis. SCA and PCA origins are within normal limits. Tortuous left P1. Mild bilateral P1/P2 irregularity and stenosis. Posterior communicating arteries are diminutive or absent. Left PCA branches are within normal limits. Anatomic variants: None. Other: No intracranial mass effect or ventriculomegaly. IMPRESSION: Mild for age intracranial atherosclerosis. Negative for large vessel occlusion or significant posterior circulation stenosis. The proximal right PICA remains patent. Electronically Signed   By: Genevie Ann M.D.   On: 09/28/2022 09:02   MR BRAIN WO CONTRAST  Result Date: 09/27/2022 CLINICAL DATA:  Monocular vision loss, waxing and waning, blurry vision EXAM: MRI HEAD WITHOUT CONTRAST TECHNIQUE: Multiplanar, multiecho pulse sequences of the brain and surrounding structures were obtained without intravenous contrast. COMPARISON:  09/27/2020 MRI head FINDINGS: Brain: Restricted diffusion with ADC correlate in the left inferior cerebellum (series 2, image 13 and series 5, image 8), consistent with small acute infarct. No acute hemorrhage, mass, mass effect, or midline shift. No hydrocephalus or extra-axial collection. Confluent and scattered T2 hyperintense signal in the periventricular white matter, likely the sequela of moderate chronic small vessel ischemic disease. No hemosiderin deposition to suggest remote hemorrhage. Remote lacunar infarcts in the right basal ganglia and left cerebellum, which were present in 2021.  Vascular: Normal arterial flow voids. Skull and upper cervical spine: Normal marrow signal. Sinuses/Orbits: Air-fluid level and mucosal thickening in the right maxillary sinus. Otherwise clear paranasal sinuses. Status post bilateral lens replacements. Other: The mastoids are well aerated. IMPRESSION: Small acute infarct in the left inferior cerebellum. These results will be called to the ordering clinician or representative by the Radiologist Assistant, and communication documented in the PACS or [employer]. Electronically Signed   By: Merilyn Baba M.D.   On: 09/27/2022 22:34   DG ESOPHAGUS W SINGLE CM (SOL OR THIN BA)  Result Date: 09/25/2022 CLINICAL DATA:  Esophageal dysmotility. History of Nissen fundoplication in 9326. Long history of esophageal issues  with repeated endoscopies for balloon dilation and Botox injection. Now complaining of globus sensation. EXAM: ESOPHAGUS/BARIUM SWALLOW/TABLET STUDY TECHNIQUE: Single contrast examination was performed using thin liquid barium. This exam was performed by Gareth Eagle, PA-C, and was supervised and interpreted by Dr. Logan Bores. FLUOROSCOPY: Radiation Exposure Index (as provided by the fluoroscopic device): 5.5 mGy Kerma COMPARISON:  Esophagram done August 10, 2019 and CT scan done September 23, 2022 FINDINGS: Swallowing: Appears normal. No vestibular penetration or aspiration seen. Pharynx: Unremarkable. Esophagus: Mildly patulous esophagus.  No evidence of a stricture. Esophageal motility: Moderate esophageal dysmotility with contrast stasis and tertiary contractions. Hiatal Hernia: Moderate-sized sliding hiatal hernia. Gastroesophageal reflux: None visualized. Ingested 13 mm barium tablet: Not given Other: Within the proximal stomach just below the diaphragm, there is chronic severe narrowing of the gastric lumen over a length of greater than 2 cm which is similar to the prior esophagram and results in delayed/slowed passage of barium into the distal  stomach. IMPRESSION: 1. Chronic severe narrowing of the proximal stomach, presumably at the site of prior fundoplication, which is similar to the 2020 esophagram and delays passage of barium. 2. Esophageal dysmotility with tertiary contractions. 3. Mildly patulous esophagus.  No esophageal stricture identified. Electronically Signed   By: Logan Bores M.D.   On: 09/25/2022 10:05   CT CORONARY MORPH W/CTA COR W/SCORE W/CA W/CM &/OR WO/CM  Addendum Date: 09/23/2022   ADDENDUM REPORT: 09/23/2022 15:40 CLINICAL DATA:  Chest pain EXAM: Cardiac CTA MEDICATIONS: Sub lingual nitro. 7m x 2 TECHNIQUE: The patient was scanned on a Siemens 1614slice scanner. Gantry rotation speed was 250 msecs. Collimation was 0.6 mm. A 100 kV prospective scan was triggered in the ascending thoracic aorta at 35-75% of the R-R interval. Average HR during the scan was 60 bpm. The 3D data set was interpreted on a dedicated work station using MPR, MIP and VRT modes. A total of 80cc of contrast was used. FINDINGS: Non-cardiac: [facility]. Pulmonary veins drain normally to the left atrium. No LA appendage thrombus. Calcium Score: 457 Agatston units. Coronary Arteries: Right dominant with no anomalies LM: Calcified plaque distal left main, mild (1-24%) stenosis. LAD system: Ostial calcified plaque, mild (1-24%) stenosis. Mixed plaque proximal LAD, mild (25-49%) stenosis. Circumflex system: No plaque or stenosis. RCA system: Difficult to assess due to artifact. By using multiple phases, suspect only mild (1-24%) stenosis with calcified plaque in proximal and mid RCA. IMPRESSION: 1. Coronary calcium score 457 Agatston units. This places the patient in the 73rd percentile for age and gender, suggesting intermediate risk for future cardiac events. 2. Very difficult images due to artifact, but suspect no more than mild stenosis in the coronary tree. Dalton Mclean Electronically Signed   By: DLoralie ChampagneM.D.   On:  09/23/2022 15:40   Result Date: 09/23/2022 EXAM: OVER-READ INTERPRETATION  CT CHEST The following report is a limited chest CT over-read performed by radiologist Dr. LYetta Glassmanof [facility], [facility] 09/23/2022. This over-read does not include interpretation of cardiac or coronary anatomy or pathology. The coronary CTA interpretation by the cardiologist is attached. COMPARISON:  None Available. FINDINGS: Vascular: Cardiomegaly. No pericardial effusion. Normal caliber thoracic aorta with moderate atherosclerotic disease. Mediastinum/Nodes: Moderate hiatal hernia. Patulous and fluid-filled esophagus. No pathologically enlarged lymph nodes seen in the chest. Lungs/Pleura: Lungs are clear. No pleural effusion or pneumothorax. Upper Abdomen: Partially visualized lucency under the right hemidiaphragm, concerning for free air. Musculoskeletal: No chest wall mass or suspicious  bone lesions identified. IMPRESSION: 1. Air under the right hemidiaphragm which could be due to pneumoperitoneum or partially visualized bowel loop. Recommend further evaluation with CT or abdominal radiograph depending on level concern clinical concern. 2. Aortic Atherosclerosis (ICD10-I70.0). 3. Moderate hiatal hernia. 4. Patulous and fluid-filled esophagus, findings can be seen in the setting of esophageal dysmotility. Critical Value/emergent results were called by telephone at the time of interpretation on 09/23/2022 at 1:23 pm to provider [facility], who verbally acknowledged these results. Electronically Signed: By: Yetta Glassman M.D. On: 09/23/2022 13:27   CT ABDOMEN PELVIS WO CONTRAST  Result Date: 09/23/2022 CLINICAL DATA:  Acute generalized abdominal pain. EXAM: CT ABDOMEN AND PELVIS WITHOUT CONTRAST TECHNIQUE: Multidetector CT imaging of the abdomen and pelvis was performed following the standard protocol without IV contrast. RADIATION DOSE REDUCTION: This exam was performed according to the departmental  dose-optimization program which includes automated exposure control, adjustment of the mA and/or kV according to patient size and/or use of iterative reconstruction technique. COMPARISON:  CT of today.  CT of November 22, 2021. FINDINGS: Lower chest: No acute abnormality. Hepatobiliary: No focal liver abnormality is seen. No gallstones, gallbladder wall thickening, or biliary dilatation. Pancreas: Unremarkable. No pancreatic ductal dilatation or surrounding inflammatory changes. Spleen: Normal in size without focal abnormality. Adrenals/Urinary Tract: Adrenal glands are unremarkable. Kidneys are normal, without renal calculi, focal lesion, or hydronephrosis. Bladder is unremarkable. Stomach/Bowel: Moderate size sliding-type hiatal hernia is noted. There is no evidence of bowel obstruction or inflammation. Vascular/Lymphatic: Aortic atherosclerosis. No enlarged abdominal or pelvic lymph nodes. Reproductive: Status post hysterectomy. No adnexal masses. Other: No abdominal wall hernia or abnormality. No abdominopelvic ascites. No pneumoperitoneum is noted. The abnormality in question on prior exam represented bowel. Musculoskeletal: Status post surgical posterior fusion extending from L2-S1. No acute osseous abnormality is noted. IMPRESSION: Moderate size sliding-type hiatal hernia. There is no evidence of pneumoperitoneum. No acute abnormality seen in the abdomen or pelvis. Aortic Atherosclerosis (ICD10-I70.0). Electronically Signed   By: Marijo Conception M.D.   On: 09/23/2022 15:02   DG Chest Port 1 View  Result Date: 09/22/2022 CLINICAL DATA:  Chest pain, difficulty breathing. EXAM: PORTABLE CHEST 1 VIEW COMPARISON:  None Available. FINDINGS: Heart is enlarged, unchanged. Both lungs are clear. The visualized skeletal structures are unremarkable. IMPRESSION: No active disease. Cardiomegaly. Electronically Signed   By: Ronney Asters M.D.   On: 09/22/2022 17:44     PHYSICAL EXAM  Temp:  [97.6 F (36.4 C)-98.2  F (36.8 C)] 98.2 F (36.8 C) (11/25 1746) Pulse Rate:  [60-69] 69 (11/25 1746) Resp:  [16-18] 16 (11/25 0845) BP: (119-159)/(53-105) 145/63 (11/25 1746) SpO2:  [95 %-100 %] 100 % (11/25 1746) Weight:  [69.1 kg] 69.1 kg (11/25 0410)  General - Well nourished, well developed, in no apparent distress.  Ophthalmologic - fundi not visualized due to noncooperation.  Cardiovascular - Regular rhythm and rate.  Mental Status -  Level of arousal and orientation to time, place, and person were intact. Language including expression, naming, repetition, comprehension was assessed and found intact. Fund of Knowledge was assessed and was intact.  Cranial Nerves II - XII - II - Visual field intact OU. III, IV, VI - Extraocular movements intact. V - Facial sensation intact bilaterally. VII - Facial movement intact bilaterally. VIII - Hearing & vestibular intact bilaterally. X - Palate elevates symmetrically. XI - Chin turning & shoulder shrug intact bilaterally. XII - Tongue protrusion intact.  Motor Strength - The patient's strength was normal in  all extremities and pronator drift was absent.  Bulk was normal and fasciculations were absent.   Motor Tone - Muscle tone was assessed at the neck and appendages and was normal.  Reflexes - The patient's reflexes were symmetrical in all extremities and she had no pathological reflexes.  Sensory - Light touch, temperature/pinprick were assessed and were symmetrical.    Coordination - The patient had normal movements in the hands and feet with no ataxia or dysmetria.  Tremor was absent.  Gait and Station - deferred.   ASSESSMENT/PLAN Julie Rice is a 87 y.o. female with history of HTN, RA, CHF, COPD on home oxygen, PAF which was on hold for 2 days for EGD, CAD admitted for chest pain.  Found to have wide-complex tachycardia and esophageal stricture on EGD status post Botox injection.  Patient also complaining of right eye intermittent  blurry vision.  MRI showed small left cerebellar infarct.  No tPA given due to outside window and minimal symptoms.    Stroke, incidental:  left cerebellar small infarct embolic secondary to A-fib off Eliquis in preparation for EGD  MRI brain small acute infarct in the left inferior cerebellum MRA mild for age intracranial atherosclerosis Carotid Doppler unremarkable 2D Echo EF 60 to 65% LDL 99 HgbA1c pending SCDs for VTE prophylaxis Eliquis (apixaban) daily hold for EGD prior to admission, now on Eliquis (apixaban) daily.  Continue Eliquis 5 mg twice daily on discharge. Patient counseled to be compliant with her antithrombotic medications Ongoing aggressive stroke risk factor management Therapy recommendations: Pending Disposition: Pending  unlikely temporal arteritis Intermittent short lasting right eye mild blurry vision - " fuzzy images" Right temporoparietal mild headache Bilateral jaw soreness but not related to chewing or eating Left shoulder soreness Denies vision loss, jaw claudication, severe headache ESR 30, CRP normal Temporal artery ultrasound negative for Halo sign Continue follow-up with Dr. Carles Collet and Idamae Schuller  PAF On Eliquis PTA Hold off for 2 days for EGD Now resume Eliquis 5 mg twice daily, continue on discharge  Hypertension Stable Long term BP goal normotensive  Hyperlipidemia Home meds: None LDL 99, goal < 70 Now on Crestor 20 Continue statin at discharge  Other Stroke Risk Factors Advanced age CHF CAD  Other Active Problems RA COPD on home oxygen Esophageal stricture status post Botox injection  Hospital day # 0  Neurology will sign off. Please call with questions. Pt will follow up with Dr. Carles Collet at [facility] on 11/18/22. Thanks for the consult.   Rosalin Hawking, MD PhD Stroke Neurology 09/28/2022 6:54 PM  I discussed with Dr. Cruzita Lederer. I spent extra 30 inpatient total face-to-face time with the patient, more than 50% of which was spent in counseling and  coordination of care, reviewing test results, images and medication, and discussing the diagnosis, treatment plan and potential prognosis. This patient's care requiresreview of multiple databases, neurological assessment, discussion with family, other specialists and medical decision making of high complexity.      To contact Stroke Continuity provider, please refer to [email or web address] After hours, contact General Neurology

## 2022-09-28 NOTE — TOC Progression Note (Signed)
Transition of Care [facility]) - Progression Note    Patient Details  Name: Julie Rice MRN: [id number] Date of Birth: [birth year]  Transition of Care [facility]) CM/SW Contact  Patrice Paradise, LCSW Phone Number: 09/28/2022, 10:05 AM  Clinical Narrative:     CSW was alerted of pt's pending discharge. CSW checked pt's authorization and it has been approved. Navi# [id number] Auth dates: 11/25-11/28.  Pt spoke with April at [facility] and she confirmed admission for tomorrow as long as DC summary is received by 11:00AM. CSW updated MD.  [facility] team will continue to assist with discharge planning needs.   Expected Discharge Plan: Skilled Nursing Facility Barriers to Discharge: Continued Medical Work up  Expected Discharge Plan and Services Expected Discharge Plan: Skilled Nursing Facility       Living arrangements for the past 2 months: Single Family Home                                       Social Determinants of Health (SDOH) Interventions    Readmission Risk Interventions   No data to display

## 2022-09-28 NOTE — Consult Note (Addendum)
NEUROLOGY CONSULTATION NOTE   Date of service: September 28, 2022 Patient Name: Julie Rice MRN:  [id number] DOB:  1935-11-05 Reason for consult: "cerebellum stroke" Requesting Provider: Caren Griffins, MD _ _ _   _ __   _ __ _ _  __ __   _ __   __ _  History of Present Illness  Julie Rice is a 86 y.o. female with PMH significant for  HTN, RA, chronic diastolic CHF, COPD, chronic respiratory failure at home on 2L at home, esophageal strictures, PAF, CAD, esophageal strictures who presented with chest pain + buring and found to have wide complex tachycardia. She also underwent EGD this AM after Eliquis was held for 2 days and found to have esophageal strictures s/p botox injections. In the afternoon, reported episode of R eye blurred vision. She was evaluated by rapid response and NIHSS was 0. She had MRI Brain which demonstrated small left cerebellum stroke.  Neurology consulted for further evaluation and workup.  Endorses she has been having R eye intermittent blurred vision for the last 2 months. It has been becoming more and more frequent however. Also reports jaw pain with chewing for last month along with headaches on the left temporooccipital region. No tenderness to palpation, no fever.    ROS   Constitutional Denies weight loss, fever and chills.   HEENT Denies changes in vision and hearing.   Respiratory Denies SOB and cough.   CV Denies palpitations and CP   GI Denies abdominal pain, nausea, vomiting and diarrhea.   GU Denies dysuria and urinary frequency.   MSK Denies myalgia and joint pain.   Skin Denies rash and pruritus.   Neurological Denies headache and syncope.   Psychiatric Denies recent changes in mood. Denies anxiety and depression.    Past History   Past Medical History:  Diagnosis Date   A-fib ([place])    Anemia    Anxiety    Bipolar disorder ([place])    Blood transfusion 1991   autologous pts own blood given    CAD (coronary artery disease)     Cataract    Chest pain    "@ rest, lying down, w/exertion"   CHF (congestive heart failure) ([place]) 07/2020   Chronic back pain    "mostly lower back but I do have upper back pain regularly" (04/06/2018)   COPD (chronic obstructive pulmonary disease) ([facility])    Depression    Esophageal stricture    Fibromyalgia    GERD (gastroesophageal reflux disease)    Heart murmur    "slight" (04/06/2018)   Hiatal hernia    Hyperlipidemia    Patient denies   Hypertension    Internal hemorrhoids    Lumbago    Mitral regurgitation    Osteoarthritis    Osteoporosis    Pneumonia ~ 01/2018   PONV (postoperative nausea and vomiting)    severe ponv, "in the past" (04/06/2018)   Rectal bleeding    Rheumatoid arthritis ([place])    Spondylosis    TIA (transient ischemic attack) 2013   Urinary incontinence    wears depends   Past Surgical History:  Procedure Laterality Date   ABDOMINAL HYSTERECTOMY  1982   BACK SURGERY     BALLOON DILATION N/A 09/21/2018   Procedure: BALLOON DILATION;  Surgeon: Irene Shipper, MD;  Location: [facility] ENDOSCOPY;  Service: Endoscopy;  Laterality: N/A;   BALLOON DILATION N/A 08/12/2019   Procedure: BALLOON DILATION;  Surgeon: Jackquline Denmark, MD;  Location: [facility]  ENDOSCOPY;  Service: Endoscopy;  Laterality: N/A;   BIOPSY  08/12/2019   Procedure: BIOPSY;  Surgeon: Jackquline Denmark, MD;  Location: [place];  Service: Endoscopy;;   BLADDER SUSPENSION  1980's   BOTOX INJECTION N/A 09/21/2018   Procedure: BOTOX INJECTION;  Surgeon: Irene Shipper, MD;  Location: [facility] ENDOSCOPY;  Service: Endoscopy;  Laterality: N/A;   BOTOX INJECTION N/A 08/12/2019   Procedure: BOTOX INJECTION;  Surgeon: Jackquline Denmark, MD;  Location: [place] ENDOSCOPY;  Service: Endoscopy;  Laterality: N/A;   CATARACT EXTRACTION W/ INTRAOCULAR LENS  IMPLANT, BILATERAL Bilateral 2010   DILATION AND CURETTAGE OF UTERUS  1961   ESOPHAGEAL MANOMETRY N/A 03/25/2018   Procedure: ESOPHAGEAL MANOMETRY (EM);  Surgeon: Mauri Pole, MD;   [facility] ENDOSCOPY;  Service: Endoscopy;  Laterality: N/A;   ESOPHAGOGASTRODUODENOSCOPY (EGD) WITH PROPOFOL N/A 04/07/2018   Procedure: ESOPHAGOGASTRODUODENOSCOPY (EGD) WITH PROPOFOL;  Surgeon: Doran Stabler, MD;  Location: [place];  Service: Gastroenterology;  Laterality: N/A;   ESOPHAGOGASTRODUODENOSCOPY (EGD) WITH PROPOFOL N/A 09/21/2018   Procedure: ESOPHAGOGASTRODUODENOSCOPY (EGD) WITH PROPOFOL;  Surgeon: Irene Shipper, MD;  [facility];  Service: Endoscopy;  Laterality: N/A;   ESOPHAGOGASTRODUODENOSCOPY (EGD) WITH PROPOFOL N/A 08/12/2019   Procedure: ESOPHAGOGASTRODUODENOSCOPY (EGD) WITH PROPOFOL;  Surgeon: Jackquline Denmark, MD;  Location[facility] ENDOSCOPY;  Service: Endoscopy;  Laterality: N/A;   HERNIA REPAIR     HIATAL HERNIA REPAIR N/A 08/02/2016   Procedure: LAPAROSCOPIC REPAIR OF LARGE  HIATAL HERNIA;  Surgeon: Excell Seltzer, MD;  Location: [facility] ORS;  Service: General;  Laterality: N/A;   JOINT REPLACEMENT     LAPAROSCOPIC NISSEN FUNDOPLICATION N/A 2/86/3817   Procedure: LAPAROSCOPIC NISSEN FUNDOPLICATION;  Surgeon: Excell Seltzer, MD;  Location: [facility] ORS;  Service: General;  Laterality: N/A;   [place]; 1994; [id number]   "I've got 2 stainless steel rods; 6 screws; 2 ray cages"took bone from right hip to put in back   RIGHT/LEFT HEART CATH AND CORONARY ANGIOGRAPHY N/A 07/26/2019   Procedure: RIGHT/LEFT HEART CATH AND CORONARY ANGIOGRAPHY;  Surgeon: Belva Crome, MD;  Location: [place] CV LAB;  Service: Cardiovascular;  Laterality: N/A;   SVT ABLATION N/A 04/25/2021   Procedure: SVT ABLATION;  Surgeon: Constance Haw, MD;  Location: [place] CV LAB;  Service: Cardiovascular;  Laterality: N/A;   TEE WITHOUT CARDIOVERSION N/A 08/19/2019   Procedure: TRANSESOPHAGEAL ECHOCARDIOGRAM (TEE);  Surgeon: Lelon Perla, MD;  Location: [facility] ENDOSCOPY;  Service: Cardiovascular;  Laterality: N/A;   TONSILLECTOMY AND ADENOIDECTOMY  1945   TOTAL KNEE  ARTHROPLASTY Right ~ [place]  ~ 1976   Family History  Problem Relation Age of Onset   Kidney disease Mother    Hypertension Mother    Heart disease Father 82       die of MI at age 70   Heart disease Brother    Heart attack Brother    Suicidality Son    Other Son        MVA   Colon cancer Neg Hx    Esophageal cancer Neg Hx    Pancreatic cancer Neg Hx    Rectal cancer Neg Hx    Stomach cancer Neg Hx    Social History   Socioeconomic History   Marital status: Widowed    Spouse name: Not on file   Number of children: 2   Years of education: 60   Highest education level: 12th grade  Occupational History   Occupation: retired  Employer: RETIRED  Tobacco Use   Smoking status: Never   Smokeless tobacco: Never  Vaping Use   Vaping Use: Never used  Substance and Sexual Activity   Alcohol use: Never   Drug use: Never   Sexual activity: Not Currently    Comment: 1st intercourse 61 yo-1 partner  Other Topics Concern   Not on file  Social History Narrative   Retired    Widowed   Right handed   Social Determinants of Health   Financial Resource Strain: [place]  (02/14/2022)   Overall Financial Resource Strain (CARDIA)    Difficulty of Paying Living Expenses: Not hard at all  Food Insecurity: No Food Insecurity (09/23/2022)   Hunger Vital Sign    Worried About Running Out of Food in the Last Year: Never true    Ran Out of Food in the Last Year: Never true  Transportation Needs: No Transportation Needs (09/23/2022)   PRAPARE - Hydrologist (Medical): No    Lack of Transportation (Non-Medical): No  Physical Activity: Inactive (02/14/2022)   Exercise Vital Sign    Days of Exercise per Week: 0 days    Minutes of Exercise per Session: 0 min  Stress: No Stress Concern Present (02/14/2022)   [place]    Feeling of Stress : Not at all  Social Connections:  Moderately Integrated (02/14/2022)   Social Connection and Isolation Panel [NHANES]    Frequency of Communication with Friends and Family: More than three times a week    Frequency of Social Gatherings with Friends and Family: More than three times a week    Attends Religious Services: More than 4 times per year    Active Member of [employer] or Organizations: Yes    Attends Archivist Meetings: More than 4 times per year    Marital Status: Widowed   Allergies  Allergen Reactions   Tape Other (See Comments)    Band aides, adhesive tape Redness and pulls skin off   Tramadol Other (See Comments)    Pt has prolonged Qtc interval of 540- cannot give tramadol per pharmacy   Morphine Itching and Rash    Flushing    Medications   Medications Prior to Admission  Medication Sig Dispense Refill Last Dose   acetaminophen (TYLENOL) 500 MG tablet Take 1,000 mg by mouth every 6 (six) hours as needed for moderate pain, headache or fever.   unknown   albuterol (VENTOLIN HFA) 108 (90 Base) MCG/ACT inhaler TAKE 2 PUFFS BY MOUTH EVERY 6 HOURS AS NEEDED FOR WHEEZE OR SHORTNESS OF BREATH (Patient taking differently: Inhale 2 puffs into the lungs every 6 (six) hours as needed for wheezing or shortness of breath.) 8.5 each 2 unknown   buPROPion (WELLBUTRIN XL) 300 MG 24 hr tablet TAKE 1 TABLET BY MOUTH EVERY DAY 90 tablet 1 Past Week   Carboxymethylcellulose Sodium (REFRESH TEARS OP) Place 1 drop into both eyes 2 (two) times daily.   Past Week   cetirizine (ZYRTEC) 10 MG tablet Take 10 mg by mouth daily as needed for allergies.   unknown   clonazePAM (KLONOPIN) 0.5 MG tablet Take 0.5 tablets (0.25 mg total) by mouth 2 (two) times daily as needed for anxiety. 30 tablet 2 unknown   DULoxetine (CYMBALTA) 60 MG capsule TAKE 1 CAPSULE BY MOUTH EVERY DAY (Patient taking differently: Take 60 mg by mouth daily.) 90 capsule 3 Past Week   ELIQUIS 5  MG TABS tablet TAKE 1 TABLET BY MOUTH TWICE A DAY (Patient taking  differently: Take 5 mg by mouth 2 (two) times daily.) 180 tablet 1 09/21/2022 at 1000   FeFum-FePoly-FA-B Cmp-C-Biot (INTEGRA PLUS) CAPS Take 1 capsule by mouth every morning. 30 capsule 2 Past Week   KLOR-CON M20 20 MEQ tablet TAKE 1 TABLET BY MOUTH EVERY DAY 90 tablet 2 Past Week   losartan (COZAAR) 50 MG tablet TAKE 1 TABLET BY MOUTH EVERY DAY 90 tablet 3 Past Week   metoprolol tartrate (LOPRESSOR) 50 MG tablet Take 1.5 tablets (75 mg total) by mouth 2 (two) times daily. 270 tablet 3 09/21/2022 at 1000   Multiple Vitamins-Minerals (ONE-A-DAY WOMENS 50+) TABS Take 1 tablet by mouth daily.   Past Week   omeprazole (PRILOSEC) 20 MG capsule TAKE 1 CAPSULE BY MOUTH EVERY DAY (Patient taking differently: Take 20 mg by mouth daily.) 90 capsule 3 Past Week   pregabalin (LYRICA) 50 MG capsule TAKE 1 CAPSULE BY MOUTH EVERY DAY (Patient taking differently: Take 50 mg by mouth daily.) 90 capsule 0 Past Week   sodium chloride (OCEAN) 0.65 % SOLN nasal spray Place 1 spray into both nostrils as needed for congestion.   unknown   spironolactone (ALDACTONE) 25 MG tablet Take 25 mg by mouth daily.   Past Week     Vitals   Vitals:   09/27/22 1508 09/27/22 1700 09/27/22 1935 09/28/22 0410  BP: (!) 125/52 (!) 140/65 (!) 119/105 (!) 159/69  Pulse:   67 61  Resp:   18 18  Temp:   98 F (36.7 C) 97.6 F (36.4 C)  TempSrc:   Oral Oral  SpO2:   98% 98%  Weight:    69.1 kg  Height:         Body mass index is 27.87 kg/m.  Physical Exam   General: Laying comfortably in bed; in no acute distress.  HENT: Normal oropharynx and mucosa. Normal external appearance of ears and nose.  Neck: Supple, no pain or tenderness  CV: No JVD. No peripheral edema.  Pulmonary: Symmetric Chest rise. Normal respiratory effort.  Abdomen: Soft to touch, non-tender.  Ext: No cyanosis, edema, or deformity  Skin: No rash. Normal palpation of skin.   Musculoskeletal: Normal digits and nails by inspection. No clubbing.    Neurologic Examination  Mental status/Cognition: Alert, oriented to self, place, month and year, good attention. Speech/language: Fluent, comprehension intact, object naming intact, repetition intact.  Cranial nerves:   CN II Pupils equal and reactive to light, no VF deficits    CN III,IV,VI EOM intact, no gaze preference or deviation, no nystagmus    CN V normal sensation in V1, V2, and V3 segments bilaterally    CN VII no asymmetry, no nasolabial fold flattening    CN VIII normal hearing to speech    CN IX & X normal palatal elevation, no uvular deviation    CN XI 5/5 head turn and 5/5 shoulder shrug bilaterally   CN XII midline tongue protrusion    Motor:  Muscle bulk: normal, tone normal, pronator drift none tremor none Mvmt Root Nerve  Muscle Right Left Comments  SA C5/6 Ax Deltoid 5 5   EF C5/6 Mc Biceps 5 5   EE C6/7/8 Rad Triceps 5 5   WF C6/7 Med FCR     WE C7/8 PIN ECU     F Ab C8/T1 U ADM/FDI 5 5   HF L1/2/3 Fem Illopsoas 5 5  KE L2/3/4 Fem Quad 5 5   DF L4/5 D Peron Tib Ant 5 5   PF S1/2 Tibial Grc/Sol 5 5    Sensation:  Light touch Intact throughout   Pin prick    Temperature    Vibration   Proprioception    Coordination/Complex Motor:  - Finger to Nose intact BL - Heel to shin are normal - Rapid alternating movement are normal - Gait: deferred.  Labs   CBC:  Recent Labs  Lab 09/24/22 0025 09/25/22 0046 09/26/22 0028 09/27/22 0041  WBC 12.4* 10.9* 9.3 10.5  NEUTROABS 9.7* 7.8*  --   --   HGB 10.0* 10.0* 9.3* 8.7*  HCT 32.2* 31.3* 28.6* 26.8*  MCV 94.2 91.5 91.7 92.4  PLT 354 364 319 409    Basic Metabolic Panel:  Lab Results  Component Value Date   NA 138 09/27/2022   K 4.6 09/27/2022   CO2 25 09/27/2022   GLUCOSE 103 (H) 09/27/2022   BUN 26 (H) 09/27/2022   CREATININE 1.31 (H) 09/27/2022   CALCIUM 8.8 (L) 09/27/2022   GFRNONAA 40 (L) 09/27/2022   GFRAA >60 06/30/2020   Lipid Panel:  Lab Results  Component Value Date   LDLCALC  84 03/09/2020   HgbA1c:  Lab Results  Component Value Date   HGBA1C 5.7 07/31/2022   Urine Drug Screen:     Component Value Date/Time   LABOPIA NONE DETECTED 02/05/2012 0058   COCAINSCRNUR NONE DETECTED 02/05/2012 0058   LABBENZ POSITIVE (A) 02/05/2012 0058   AMPHETMU NONE DETECTED 02/05/2012 0058   THCU NONE DETECTED 02/05/2012 0058   LABBARB NONE DETECTED 02/05/2012 0058    Alcohol Level     Component Value Date/Time   ETH 102 (H) 02/05/2012 0030    MRI Brain(Personally reviewed): Small L cerebellum stroke  Impression   Julie Rice is a 86 y.o. female with PMH significant for  HTN, RA, chronic diastolic CHF, COPD, chronic respiratory failure at home on 2L at home, esophageal strictures, PAF, CAD, esophageal strictures who presented with chest pain + buring and found to have wide complex tachycardia. She also underwent EGD this AM after Eliquis was held for 2 days and found to have esophageal strictures s/p botox injections. In the afternoon, reported episode of R eye blurred vision. She was evaluated by rapid response and NIHSS was 0. She had MRI Brain which demonstrated small left cerebellum stroke.  Etiology of stroke is likely embolic in the setting of holding her eliquis. Can get vessel imaging to rule out intra and extracranial atherosclerosis to see if she would benefit from adding an antiplatelet agent. Agree with getting lipid panel and Hemoglobin A1c.  However, concerning thou is her intermittent R eye blurred vision that has been becoming more frequent over the last 2 months. Not classic for GCA, but does endorse some jaw pain with chewing and has to massage her jaw along with L temporo-occipital pain over the last month with no tenderness on palpation. No fever, no temple tenderness, no muscle aches or pains concerning for PMR.  I wold recommend getting ESR/CRP and Temporal artery [place] to evaluate for potential GCA.  Recommendations   - Frequent Neuro checks per  stroke unit protocol - Recommend Vascular imaging with MRA Angio Head without contrast and US Carotid doppler - no need to repeat TTE, she is already on Eliquis. - Recommend obtaining Lipid panel with LDL - Please start statin if LDL > 70 - Recommend HbA1c - resume eliquis  as able - SBP goal - permissive hypertension first 24 h < 220/110. Held home meds.  - Recommend Telemetry monitoring for arrythmia - Recommend bedside swallow screen prior to PO intake. - Stroke education booklet - Recommend PT/OT/SLP consult  - ESR/CRP, temporal artery USS. - Optho consult  ______________________________________________________________________   Thank you for the opportunity to take part in the care of this patient. If you have any further questions, please contact the neurology consultation attending.  Signed,  [place] Pager Number [phone] _ _ _   _ __   _ __ _ _  __ __   _ __   __ _

## 2022-09-28 NOTE — Progress Notes (Signed)
Mobility Specialist Progress Note    09/28/22 1328  Mobility  Activity Ambulated with assistance in hallway  Level of Assistance Contact guard assist, steadying assist  Assistive Device Front wheel walker  Distance Ambulated (ft) 150 ft  Activity Response Tolerated well  Mobility Referral Yes  $Mobility charge 1 Mobility   Pre-Mobility: 75 HR During Mobility: 93 HR Post-Mobility: 66 HR  Pt received in bed and agreeable. No complaints on walk. Returned to sitting EOB with call bell in reach.    Addison Nation Mobility Specialist  Please Neurosurgeon or Rehab Office at [phone]

## 2022-09-28 NOTE — Progress Notes (Signed)
*  PRELIMINARY RESULTS* Echocardiogram 2D Echocardiogram has been performed with saline bubble study.  Stacey Drain 09/28/2022, 12:57 PM

## 2022-09-29 ENCOUNTER — Encounter (HOSPITAL_COMMUNITY): Payer: Self-pay | Admitting: Gastroenterology

## 2022-09-29 DIAGNOSIS — R5383 Other fatigue: Secondary | ICD-10-CM | POA: Diagnosis not present

## 2022-09-29 DIAGNOSIS — I471 Supraventricular tachycardia, unspecified: Secondary | ICD-10-CM | POA: Diagnosis not present

## 2022-09-29 DIAGNOSIS — I5022 Chronic systolic (congestive) heart failure: Secondary | ICD-10-CM | POA: Diagnosis not present

## 2022-09-29 DIAGNOSIS — Z7401 Bed confinement status: Secondary | ICD-10-CM | POA: Diagnosis not present

## 2022-09-29 DIAGNOSIS — K209 Esophagitis, unspecified without bleeding: Secondary | ICD-10-CM | POA: Diagnosis not present

## 2022-09-29 DIAGNOSIS — I5033 Acute on chronic diastolic (congestive) heart failure: Secondary | ICD-10-CM | POA: Diagnosis not present

## 2022-09-29 DIAGNOSIS — F411 Generalized anxiety disorder: Secondary | ICD-10-CM | POA: Diagnosis not present

## 2022-09-29 DIAGNOSIS — I639 Cerebral infarction, unspecified: Secondary | ICD-10-CM | POA: Diagnosis not present

## 2022-09-29 DIAGNOSIS — Z79899 Other long term (current) drug therapy: Secondary | ICD-10-CM | POA: Diagnosis not present

## 2022-09-29 DIAGNOSIS — G609 Hereditary and idiopathic neuropathy, unspecified: Secondary | ICD-10-CM | POA: Diagnosis not present

## 2022-09-29 DIAGNOSIS — I5020 Unspecified systolic (congestive) heart failure: Secondary | ICD-10-CM | POA: Diagnosis not present

## 2022-09-29 DIAGNOSIS — M069 Rheumatoid arthritis, unspecified: Secondary | ICD-10-CM | POA: Diagnosis not present

## 2022-09-29 DIAGNOSIS — I499 Cardiac arrhythmia, unspecified: Secondary | ICD-10-CM | POA: Diagnosis not present

## 2022-09-29 DIAGNOSIS — M316 Other giant cell arteritis: Secondary | ICD-10-CM | POA: Diagnosis not present

## 2022-09-29 DIAGNOSIS — J449 Chronic obstructive pulmonary disease, unspecified: Secondary | ICD-10-CM | POA: Diagnosis not present

## 2022-09-29 DIAGNOSIS — I480 Paroxysmal atrial fibrillation: Secondary | ICD-10-CM | POA: Diagnosis not present

## 2022-09-29 DIAGNOSIS — I10 Essential (primary) hypertension: Secondary | ICD-10-CM | POA: Diagnosis not present

## 2022-09-29 DIAGNOSIS — D6869 Other thrombophilia: Secondary | ICD-10-CM | POA: Diagnosis not present

## 2022-09-29 DIAGNOSIS — R531 Weakness: Secondary | ICD-10-CM | POA: Diagnosis not present

## 2022-09-29 DIAGNOSIS — J9611 Chronic respiratory failure with hypoxia: Secondary | ICD-10-CM | POA: Diagnosis not present

## 2022-09-29 DIAGNOSIS — I509 Heart failure, unspecified: Secondary | ICD-10-CM | POA: Diagnosis not present

## 2022-09-29 LAB — BASIC METABOLIC PANEL
Anion gap: 13 (ref 5–15)
BUN: 15 mg/dL (ref 8–23)
CO2: 22 mmol/L (ref 22–32)
Calcium: 8.7 mg/dL — ABNORMAL LOW (ref 8.9–10.3)
Chloride: 106 mmol/L (ref 98–111)
Creatinine, Ser: 0.89 mg/dL (ref 0.44–1.00)
GFR, Estimated: >60 mL/min (ref >60)
Glucose, Bld: 95 mg/dL (ref 70–99)
Potassium: 4.4 mmol/L (ref 3.5–5.1)
Sodium: 141 mmol/L (ref 135–145)

## 2022-09-29 MED ORDER — PREGABALIN 50 MG PO CAPS: 50.0000 mg | ORAL_CAPSULE | Freq: Every day | ORAL | 0 refills | Status: DC

## 2022-09-29 MED ORDER — CLONAZEPAM 0.5 MG PO TABS: 0.2500 mg | ORAL_TABLET | Freq: Two times a day (BID) | ORAL | 0 refills | Status: DC | PRN

## 2022-09-29 MED ORDER — PANTOPRAZOLE SODIUM 40 MG PO TBEC: 40.0000 mg | DELAYED_RELEASE_TABLET | Freq: Every day | ORAL | Status: DC

## 2022-09-29 MED ORDER — AMIODARONE HCL 200 MG PO TABS: 200.0000 mg | ORAL_TABLET | Freq: Every day | ORAL | Status: DC

## 2022-09-29 MED ORDER — ROSUVASTATIN CALCIUM 20 MG PO TABS: 20.0000 mg | ORAL_TABLET | Freq: Every day | ORAL | Status: DC

## 2022-09-29 MED ORDER — METOPROLOL TARTRATE 25 MG PO TABS: 12.5000 mg | ORAL_TABLET | Freq: Two times a day (BID) | ORAL | Status: DC

## 2022-09-29 NOTE — Discharge Summary (Addendum)
Physician Discharge Summary  Julie Rice NWG:[id number] DOB: 19-Oct-1936 DOA: 09/22/2022  PCP: Shirline Frees, NP  Admit date: 09/22/2022 Discharge date: 09/29/2022  Admitted From: home Disposition:  SNF  Recommendations for Outpatient Follow-up:  Follow up with PCP in 1-2 weeks Please obtain BMP/CBC in one week  Home Health: none Equipment/Devices: none  Discharge Condition: stable CODE STATUS: Full code Diet Orders (From admission, onward)     Start     Ordered   09/27/22 1003  Diet regular Room service appropriate? Yes; Fluid consistency: Thin  Diet effective now       Question Answer Comment  Room service appropriate? Yes   Fluid consistency: Thin      09/27/22 1002            HPI: Per admitting MD, This 86 year old female with past medical history of hypertension, rheumatoid arthritis, chronic CHF, COPD, chronic respiratory failure on 2.5 L at night, esophageal strictures.  Her complaint is that when she walks she develops chest tightness and a burning sensation that goes across her chest and down both arms to her elbows.  This started 10 days ago.  Subsequently it has been getting worse and worse.  She does not complain  of shortness of breath more chest tightness discomfort, palpitation.  She denies any worsening lower extremity edema.  She denies increased oxygen need.  Today after walking few steps she has a severe chest burning and palpitation she could not stand straight.  She decided to come to the ER. In the ER cardiac enzymes x3 negative.  She is tachycardic with lots of PVCs with aberrancy.  No atrial fibrillation noted.  Attempt was made to walk her and she became extremely tachycardic and dyspneic per ER physician.  Hospitalist service asked to admit.    Hospital Course / Discharge diagnoses: Principal Problem:   Arrhythmia Active Problems:   Major depressive disorder, recurrent (HCC)   Generalized anxiety disorder   Hypothyroidism   Dyslipidemia    Essential hypertension   ESOPHAGEAL STRICTURE   Esophageal stricture   (HFpEF) heart failure with preserved ejection fraction (HCC)   Prolonged QT interval   Chronic respiratory failure with hypoxia (HCC)   COPD (chronic obstructive pulmonary disease) (HCC)   PAF (paroxysmal atrial fibrillation) (HCC)   Chronic pain syndrome   Myofascial pain   Rheumatoid arthritis involving multiple sites (HCC)   Paroxysmal SVT (supraventricular tachycardia)   Idiopathic peripheral neuropathy   Generalized weakness   Temporal arteritis (HCC)  Principal problem SVT / PAF - with wide complex tachycardia.  Cardiology/EP consulted, evaluated patient and she was now started on amiodarone, continue along with metoprolol.  Now in sinus, rates better.  Continue Eliquis, amiodarone, metoprolol  Active problems Acute CVA-noticed on MRI 11/24, small acute infarct in the left inferior cerebellum.  Neurology consulted and evaluated patient while hospitalized. Discussed with Dr. Roda Shutters.  This may have happened while she was off Eliquis for 2 days in preparation for EGD.  MRA without significant LVO.  Workup pending with echo, carotids.  Lipid panel shows an LDL of 95.  A1c pending still, can be followed up as an outpatient.  PT already recommending SNF Dysphagia, chronic esophageal strictures-she has been having progressive symptoms, GI consulted, appreciate input.  She underwent an endoscopy which showed benign-appearing esophageal stenosis status post botulinum toxin injection.  Did not underwent dilatation due to a degree of esophagitis.  Continue PPI.  She still has intermittent chest discomfort /burning sensation related to this  Blurry vision-on and off, neurology concerned about temporal arteritis.  CRP not elevated, sed rate slightly high.  Temporal artery ultrasound without signs of temporal arteritis.  May need ophthalmology referral as an outpatient if persistent Mild AKI-resolved.  Spironolactone on hold on  discharge, please repeat blood work in 3 to 4 days and consider resumption if stable  Chronic diastolic CHF-appears euvolemic HTN-on metoprolol, blood pressure stable.  Continue home medication except for spironolactone GAD-continue Wellbutrin, Klonopin, duloxetine COPD with chronic hypoxic respiratory failure-wears oxygen at night only.  Sees Dr. Sherene Sires as an outpatient Leukocytosis-white count stable Mild normocytic anemia-of chronic disease, no bleeding  Sepsis ruled out   Discharge Instructions   Allergies as of 09/29/2022       Reactions   Tape Other (See Comments)   Band aides, adhesive tape Redness and pulls skin off   Tramadol Other (See Comments)   Pt has prolonged Qtc interval of 540- cannot give tramadol per pharmacy   Morphine Itching, Rash   Flushing        Medication List     STOP taking these medications    omeprazole 20 MG capsule Commonly known as: PRILOSEC Replaced by: pantoprazole 40 MG tablet   spironolactone 25 MG tablet Commonly known as: ALDACTONE       TAKE these medications    acetaminophen 500 MG tablet Commonly known as: TYLENOL Take 1,000 mg by mouth every 6 (six) hours as needed for moderate pain, headache or fever.   albuterol 108 (90 Base) MCG/ACT inhaler Commonly known as: VENTOLIN HFA TAKE 2 PUFFS BY MOUTH EVERY 6 HOURS AS NEEDED FOR WHEEZE OR SHORTNESS OF BREATH What changed: See the new instructions.   amiodarone 200 MG tablet Commonly known as: PACERONE Take 1 tablet (200 mg total) by mouth daily.   buPROPion 300 MG 24 hr tablet Commonly known as: WELLBUTRIN XL TAKE 1 TABLET BY MOUTH EVERY DAY   cetirizine 10 MG tablet Commonly known as: ZYRTEC Take 10 mg by mouth daily as needed for allergies.   clonazePAM 0.5 MG tablet Commonly known as: KLONOPIN Take 0.5 tablets (0.25 mg total) by mouth 2 (two) times daily as needed for anxiety.   DULoxetine 60 MG capsule Commonly known as: CYMBALTA TAKE 1 CAPSULE BY MOUTH  EVERY DAY What changed: how much to take   Eliquis 5 MG Tabs tablet Generic drug: apixaban TAKE 1 TABLET BY MOUTH TWICE A DAY What changed: how much to take   Integra Plus Caps Take 1 capsule by mouth every morning.   Klor-Con M20 20 MEQ tablet Generic drug: potassium chloride SA TAKE 1 TABLET BY MOUTH EVERY DAY   losartan 50 MG tablet Commonly known as: COZAAR TAKE 1 TABLET BY MOUTH EVERY DAY   metoprolol tartrate 25 MG tablet Commonly known as: LOPRESSOR Take 0.5 tablets (12.5 mg total) by mouth 2 (two) times daily. What changed:  medication strength how much to take   One-A-Day Womens 50+ Tabs Take 1 tablet by mouth daily.   pantoprazole 40 MG tablet Commonly known as: PROTONIX Take 1 tablet (40 mg total) by mouth daily. Replaces: omeprazole 20 MG capsule   pregabalin 50 MG capsule Commonly known as: LYRICA Take 1 capsule (50 mg total) by mouth daily.   REFRESH TEARS OP Place 1 drop into both eyes 2 (two) times daily.   rosuvastatin 20 MG tablet Commonly known as: CRESTOR Take 1 tablet (20 mg total) by mouth daily at 6 PM.   sodium chloride 0.65 %  Soln nasal spray Commonly known as: OCEAN Place 1 spray into both nostrils as needed for congestion.        Follow-up Information     Marcos Eke, PA-C. Go on 11/18/2022.   Specialty: Neurology Contact information: [address] [place] [address] [phone]                 Consultations: Cardiology  GI Neurology   Procedures/Studies:  VAS US CAROTID  Result Date: 09/28/2022 Carotid Arterial Duplex Study Patient Name:  Julie Rice  Date of Exam:   09/28/2022 Medical Rec #: [id number]         Accession #:    [id number] Date of Birth: Jul 12, 1936          Patient Gender: F Patient Age:   86 years Exam Location:  [facility] Procedure:      VAS US CAROTID Referring Phys: Terrilee Files [facility]  --------------------------------------------------------------------------------  Indications:       CVA. Risk Factors:      Hypertension, hyperlipidemia, no history of smoking, coronary                    artery disease. Comparison Study:  No prior studies. Performing Technologist: Jean Rosenthal RDMS, RVT  Examination Guidelines: A complete evaluation includes B-mode imaging, spectral Doppler, color Doppler, and power Doppler as needed of all accessible portions of each vessel. Bilateral testing is considered an integral part of a complete examination. Limited examinations for reoccurring indications may be performed as noted.  Right Carotid Findings: +----------+--------+--------+--------+----------------------------+--------+           PSV cm/sEDV cm/sStenosisPlaque Description          Comments +----------+--------+--------+--------+----------------------------+--------+ CCA Prox  89      9                                                    +----------+--------+--------+--------+----------------------------+--------+ CCA Distal56      10                                                   +----------+--------+--------+--------+----------------------------+--------+ ICA Prox  56      12      1-39%   hyperechoic and heterogenous         +----------+--------+--------+--------+----------------------------+--------+ ICA Mid   76      19                                          tortuous +----------+--------+--------+--------+----------------------------+--------+ ICA Distal88      17                                          tortuous +----------+--------+--------+--------+----------------------------+--------+ ECA       46                                                           +----------+--------+--------+--------+----------------------------+--------+ +----------+--------+-------+----------------+-------------------+  PSV cm/sEDV cmsDescribe        Arm  Pressure (mmHG) +----------+--------+-------+----------------+-------------------+ Subclavian109            Multiphasic, WNL                    +----------+--------+-------+----------------+-------------------+ +---------+--------+--+--------+-+---------+ VertebralPSV cm/s51EDV cm/s8Antegrade +---------+--------+--+--------+-+---------+  Left Carotid Findings: +----------+--------+--------+--------+----------------------------+--------+           PSV cm/sEDV cm/sStenosisPlaque Description          Comments +----------+--------+--------+--------+----------------------------+--------+ CCA Prox  81      12      <50%    hyperechoic and smooth               +----------+--------+--------+--------+----------------------------+--------+ CCA Distal71      10                                                   +----------+--------+--------+--------+----------------------------+--------+ ICA Prox  54      10      1-39%   hyperechoic and heterogenous         +----------+--------+--------+--------+----------------------------+--------+ ICA Mid   127     26                                          tortuous +----------+--------+--------+--------+----------------------------+--------+ ICA Distal68      20                                          tortuous +----------+--------+--------+--------+----------------------------+--------+ ECA       68                                                           +----------+--------+--------+--------+----------------------------+--------+ +----------+--------+--------+----------------+-------------------+           PSV cm/sEDV cm/sDescribe        Arm Pressure (mmHG) +----------+--------+--------+----------------+-------------------+ [id number]             Multiphasic, WNL                    +----------+--------+--------+----------------+-------------------+ +---------+--------+--+--------+--+---------+ VertebralPSV  cm/s53EDV cm/s13Antegrade +---------+--------+--+--------+--+---------+   Summary: Right Carotid: Velocities in the right ICA are consistent with a 1-39% stenosis. Left Carotid: Velocities in the left ICA are consistent with a 1-39% stenosis.               Non-hemodynamically significant plaque <50% noted in the CCA. Vertebrals:  Bilateral vertebral arteries demonstrate antegrade flow. Subclavians: Normal flow hemodynamics were seen in bilateral subclavian              arteries. *See table(s) above for measurements and observations.  Electronically signed by Coral Else MD on 09/28/2022 at 8:50:43 PM.    Final    VAS [place] TEMPORAL ARTERY BILATERAL  Result Date: 09/28/2022  TEMPORAL ARTERY REPORT Patient Name:  FATOU DUNNIGAN Hauck  Date of Exam:   09/28/2022 Medical Rec #: [id number]         Accession #:    [id number] Date  of Birth: 1936-09-04          Patient Gender: F Patient Age:   56 years Exam Location:  [facility] Procedure:      VAS [place] TEMPORTAL ARTERY BILATERAL Referring Phys: [facility] --------------------------------------------------------------------------------  Indications: Blurred vision , jaw pain and headache. High Risk Factors: Age > 50 yrs and female.  Comparison Study: No prior studies. Performing Technologist: Jean Rosenthal RDMS, RVT  Examination Guidelines: Patient in reclined position. 2D, color and spectral doppler sampling in the temporal artery along the hairline and temple in the longitudinal plane. 2D images along the hairline and temple in the transverse plane. Exam is bilateral.  Summary: Absence of a "halo" sign in the bilateral temporal artery, although not definitive, makes a diagnosis of temporal arteritis unlikely. Today's examination was limited secondary to small caliber of vessels. The left parietal artery was not visualized.  *See table(s) above for measurements and observations.  Electronically signed by Marvel Plan MD on 09/28/2022 at 7:32:10 PM.   Final     ECHOCARDIOGRAM COMPLETE BUBBLE STUDY  Result Date: 09/28/2022    ECHOCARDIOGRAM REPORT   Patient Name:   ARCHER PAJARES Silvera Date of Exam: 09/28/2022 Medical Rec #:  [id number]        Height:       62.0 in Accession #:    [id number]       Weight:       152.4 lb Date of Birth:  [birth year]         BSA:          1.703 m Patient Age:    86 years         BP:           121/99 mmHg Patient Gender: F                HR:           59 bpm. Exam Location:  Inpatient Procedure: 2D Echo, Cardiac Doppler and Color Doppler Indications:    I63.9 Stroke  History:        Patient has prior history of Echocardiogram examinations, most                 recent 03/15/2022. Cardiomyopathy and CHF, CAD, COPD; Risk                 Factors:Hypertension and Dyslipidemia. (HFpEF) heart failure                 with preserved ejection fraction (HCC), PAF (paroxysmal atrial                 fibrillation), Transient cerebral ischemia.  Sonographer:    Celesta Gentile RCS Referring Phys: [id number] CAROLE N HALL IMPRESSIONS  1. Left ventricular ejection fraction, by estimation, is 60 to 65%. The left ventricle has normal function. The left ventricle has no regional wall motion abnormalities. There is mild concentric left ventricular hypertrophy. Left ventricular diastolic parameters are consistent with Grade II diastolic dysfunction (pseudonormalization).  2. Right ventricular systolic function is normal. The right ventricular size is normal. There is moderately elevated pulmonary artery systolic pressure. The estimated right ventricular systolic pressure is 48.7 mmHg.  3. Left atrial size was severely dilated.  4. Right atrial size was mildly dilated.  5. The mitral valve is degenerative, restricted posterior leaflet associated with moderate annular calcification. Moderate mitral valve regurgitation, Carpentier class IIIb. Moderate mitral annular calcification.  6. Tricuspid valve regurgitation is moderate.  7. The  aortic valve is tricuspid. Aortic valve  regurgitation is mild. Aortic regurgitation PHT measures 680 msec.  8. The inferior vena cava is normal in size with greater than 50% respiratory variability, suggesting right atrial pressure of 3 mmHg.  9. Agitated saline contrast bubble study was negative, with no evidence of any interatrial shunt. Comparison(s): Prior images reviewed side by side. LVEF more vigorous in comparison. Mitral regurgitation is moderate. RVSP moderately elevated. FINDINGS  Left Ventricle: Left ventricular ejection fraction, by estimation, is 60 to 65%. The left ventricle has normal function. The left ventricle has no regional wall motion abnormalities. The left ventricular internal cavity size was normal in size. There is  mild concentric left ventricular hypertrophy. Left ventricular diastolic parameters are consistent with Grade II diastolic dysfunction (pseudonormalization). Right Ventricle: The right ventricular size is normal. No increase in right ventricular wall thickness. Right ventricular systolic function is normal. There is moderately elevated pulmonary artery systolic pressure. The tricuspid regurgitant velocity is 3.38 m/s, and with an assumed right atrial pressure of 3 mmHg, the estimated right ventricular systolic pressure is 48.7 mmHg. Left Atrium: Left atrial size was severely dilated. Right Atrium: Right atrial size was mildly dilated. Pericardium: There is no evidence of pericardial effusion. Mitral Valve: The mitral valve is degenerative in appearance. Moderate mitral annular calcification. Moderate mitral valve regurgitation. Tricuspid Valve: The tricuspid valve is grossly normal. Tricuspid valve regurgitation is moderate. Aortic Valve: The aortic valve is tricuspid. There is mild aortic valve annular calcification. Aortic valve regurgitation is mild. Aortic regurgitation PHT measures 680 msec. Pulmonic Valve: The pulmonic valve was grossly normal. Pulmonic valve regurgitation is trivial. Aorta: The aortic root is  normal in size and structure. Venous: The inferior vena cava is normal in size with greater than 50% respiratory variability, suggesting right atrial pressure of 3 mmHg. IAS/Shunts: No atrial level shunt detected by color flow Doppler. Agitated saline contrast was given intravenously to evaluate for intracardiac shunting. Agitated saline contrast bubble study was negative, with no evidence of any interatrial shunt.  LEFT VENTRICLE PLAX 2D LVIDd:         4.30 cm   Diastology LVIDs:         2.30 cm   LV e' medial:    5.33 cm/s LV PW:         1.20 cm   LV E/e' medial:  23.8 LV IVS:        1.20 cm   LV e' lateral:   5.55 cm/s LVOT diam:     1.55 cm   LV E/e' lateral: 22.9 LV SV:         46 LV SV Index:   27 LVOT Area:     1.89 cm  RIGHT VENTRICLE RV S prime:     15.60 cm/s TAPSE (M-mode): 2.5 cm LEFT ATRIUM              Index        RIGHT ATRIUM           Index LA diam:        4.70 cm  2.76 cm/m   RA Area:     23.30 cm LA Vol (A2C):   134.0 ml 78.68 ml/m  RA Volume:   62.80 ml  36.87 ml/m LA Vol (A4C):   120.0 ml 70.46 ml/m LA Biplane Vol: 129.0 ml 75.74 ml/m  AORTIC VALVE LVOT Vmax:   101.00 cm/s LVOT Vmean:  77.700 cm/s LVOT VTI:    0.246 m AI PHT:  680 msec  AORTA Ao Root diam: 3.10 cm MITRAL VALVE                TRICUSPID VALVE MV Area (PHT): 2.99 cm     TR Peak grad:   45.7 mmHg MV Decel Time: 254 msec     TR Vmax:        338.00 cm/s MR Peak grad: 100.8 mmHg MR Mean grad: 63.0 mmHg     SHUNTS MR Vmax:      502.00 cm/s   Systemic VTI:  0.25 m MR Vmean:     371.0 cm/s    Systemic Diam: 1.55 cm MV E velocity: 127.00 cm/s MV A velocity: 106.00 cm/s MV E/A ratio:  1.20 Nona Dell MD Electronically signed by Nona Dell MD Signature Date/Time: 09/28/2022/1:45:24 PM    Final    MR ANGIO HEAD WO CONTRAST  Result Date: 09/28/2022 CLINICAL DATA:  86 year old female with neurologic deficit and small left cerebellar lacunar type infarct on MRI yesterday. EXAM: MRA HEAD WITHOUT CONTRAST TECHNIQUE:  Angiographic images of the Circle of Willis were acquired using MRA technique without intravenous contrast. COMPARISON:  Brain MRI 09/27/2022. FINDINGS: Anterior circulation: Antegrade flow in both ICA siphons. Mild siphon irregularity. No significant siphon stenosis. Patent carotid termini, MCA and ACA origins. Visible ACA branches are normal aside from tortuosity. Left MCA M1 and trifurcation appears patent without stenosis. Right MCA M1 and bifurcation appears patent with mild irregularity and stenosis. Visible bilateral MCA branches are within normal limits. Posterior circulation: Antegrade flow in the posterior circulation with codominant distal vertebral arteries. No distal vertebral or vertebrobasilar junction. Right PICA origin and proximal vessel is patent. Left PICA appears patent. Patent basilar artery without stenosis. SCA and PCA origins are within normal limits. Tortuous left P1. Mild bilateral P1/P2 irregularity and stenosis. Posterior communicating arteries are diminutive or absent. Left PCA branches are within normal limits. Anatomic variants: None. Other: No intracranial mass effect or ventriculomegaly. IMPRESSION: Mild for age intracranial atherosclerosis. Negative for large vessel occlusion or significant posterior circulation stenosis. The proximal right PICA remains patent. Electronically Signed   By: Odessa Fleming M.D.   On: 09/28/2022 09:02   MR BRAIN WO CONTRAST  Result Date: 09/27/2022 CLINICAL DATA:  Monocular vision loss, waxing and waning, blurry vision EXAM: MRI HEAD WITHOUT CONTRAST TECHNIQUE: Multiplanar, multiecho pulse sequences of the brain and surrounding structures were obtained without intravenous contrast. COMPARISON:  09/27/2020 MRI head FINDINGS: Brain: Restricted diffusion with ADC correlate in the left inferior cerebellum (series 2, image 13 and series 5, image 8), consistent with small acute infarct. No acute hemorrhage, mass, mass effect, or midline shift. No hydrocephalus  or extra-axial collection. Confluent and scattered T2 hyperintense signal in the periventricular white matter, likely the sequela of moderate chronic small vessel ischemic disease. No hemosiderin deposition to suggest remote hemorrhage. Remote lacunar infarcts in the right basal ganglia and left cerebellum, which were present in 2021. Vascular: Normal arterial flow voids. Skull and upper cervical spine: Normal marrow signal. Sinuses/Orbits: Air-fluid level and mucosal thickening in the right maxillary sinus. Otherwise clear paranasal sinuses. Status post bilateral lens replacements. Other: The mastoids are well aerated. IMPRESSION: Small acute infarct in the left inferior cerebellum. These results will be called to the ordering clinician or representative by the Radiologist Assistant, and communication documented in the PACS or [employer]. Electronically Signed   By: Wiliam Ke M.D.   On: 09/27/2022 22:34   DG ESOPHAGUS W SINGLE CM (SOL OR THIN  BA)  Result Date: 09/25/2022 CLINICAL DATA:  Esophageal dysmotility. History of Nissen fundoplication in 2017. Long history of esophageal issues with repeated endoscopies for balloon dilation and Botox injection. Now complaining of globus sensation. EXAM: ESOPHAGUS/BARIUM SWALLOW/TABLET STUDY TECHNIQUE: Single contrast examination was performed using thin liquid barium. This exam was performed by Corrin Parker, PA-C, and was supervised and interpreted by Dr. Sebastian Ache. FLUOROSCOPY: Radiation Exposure Index (as provided by the fluoroscopic device): 5.5 mGy Kerma COMPARISON:  Esophagram done August 10, 2019 and CT scan done September 23, 2022 FINDINGS: Swallowing: Appears normal. No vestibular penetration or aspiration seen. Pharynx: Unremarkable. Esophagus: Mildly patulous esophagus.  No evidence of a stricture. Esophageal motility: Moderate esophageal dysmotility with contrast stasis and tertiary contractions. Hiatal Hernia: Moderate-sized sliding hiatal hernia.  Gastroesophageal reflux: None visualized. Ingested 13 mm barium tablet: Not given Other: Within the proximal stomach just below the diaphragm, there is chronic severe narrowing of the gastric lumen over a length of greater than 2 cm which is similar to the prior esophagram and results in delayed/slowed passage of barium into the distal stomach. IMPRESSION: 1. Chronic severe narrowing of the proximal stomach, presumably at the site of prior fundoplication, which is similar to the 2020 esophagram and delays passage of barium. 2. Esophageal dysmotility with tertiary contractions. 3. Mildly patulous esophagus.  No esophageal stricture identified. Electronically Signed   By: Sebastian Ache M.D.   On: 09/25/2022 10:05   CT CORONARY MORPH W/CTA COR W/SCORE W/CA W/CM &/OR WO/CM  Addendum Date: 09/23/2022   ADDENDUM REPORT: 09/23/2022 15:40 CLINICAL DATA:  Chest pain EXAM: Cardiac CTA MEDICATIONS: Sub lingual nitro. 4mg  x 2 TECHNIQUE: The patient was scanned on a Siemens 192 slice scanner. Gantry rotation speed was 250 msecs. Collimation was 0.6 mm. A 100 kV prospective scan was triggered in the ascending thoracic aorta at 35-75% of the R-R interval. Average HR during the scan was 60 bpm. The 3D data set was interpreted on a dedicated work station using MPR, MIP and VRT modes. A total of 80cc of contrast was used. FINDINGS: Non-cardiac: See separate report from [facility]. Pulmonary veins drain normally to the left atrium. No LA appendage thrombus. Calcium Score: 457 Agatston units. Coronary Arteries: Right dominant with no anomalies LM: Calcified plaque distal left main, mild (1-24%) stenosis. LAD system: Ostial calcified plaque, mild (1-24%) stenosis. Mixed plaque proximal LAD, mild (25-49%) stenosis. Circumflex system: No plaque or stenosis. RCA system: Difficult to assess due to artifact. By using multiple phases, suspect only mild (1-24%) stenosis with calcified plaque in proximal and mid RCA. IMPRESSION:  1. Coronary calcium score 457 Agatston units. This places the patient in the 73rd percentile for age and gender, suggesting intermediate risk for future cardiac events. 2. Very difficult images due to artifact, but suspect no more than mild stenosis in the coronary tree. Dalton Mclean Electronically Signed   By: Marca Ancona M.D.   On: 09/23/2022 15:40   Result Date: 09/23/2022 EXAM: OVER-READ INTERPRETATION  CT CHEST The following report is a limited chest CT over-read performed by radiologist Dr. Allegra Lai of [facility] on 09/23/2022. This over-read does not include interpretation of cardiac or coronary anatomy or pathology. The coronary CTA interpretation by the cardiologist is attached. COMPARISON:  None Available. FINDINGS: Vascular: Cardiomegaly. No pericardial effusion. Normal caliber thoracic aorta with moderate atherosclerotic disease. Mediastinum/Nodes: Moderate hiatal hernia. Patulous and fluid-filled esophagus. No pathologically enlarged lymph nodes seen in the chest. Lungs/Pleura: Lungs are clear. No pleural effusion or  pneumothorax. Upper Abdomen: Partially visualized lucency under the right hemidiaphragm, concerning for free air. Musculoskeletal: No chest wall mass or suspicious bone lesions identified. IMPRESSION: 1. Air under the right hemidiaphragm which could be due to pneumoperitoneum or partially visualized bowel loop. Recommend further evaluation with CT or abdominal radiograph depending on level concern clinical concern. 2. Aortic Atherosclerosis (ICD10-I70.0). 3. Moderate hiatal hernia. 4. Patulous and fluid-filled esophagus, findings can be seen in the setting of esophageal dysmotility. Critical Value/emergent results were called by telephone at the time of interpretation on 09/23/2022 at 1:23 pm to provider [facility], who verbally acknowledged these results. Electronically Signed: By: Allegra Lai M.D. On: 09/23/2022 13:27   CT ABDOMEN PELVIS WO  CONTRAST  Result Date: 09/23/2022 CLINICAL DATA:  Acute generalized abdominal pain. EXAM: CT ABDOMEN AND PELVIS WITHOUT CONTRAST TECHNIQUE: Multidetector CT imaging of the abdomen and pelvis was performed following the standard protocol without IV contrast. RADIATION DOSE REDUCTION: This exam was performed according to the departmental dose-optimization program which includes automated exposure control, adjustment of the mA and/or kV according to patient size and/or use of iterative reconstruction technique. COMPARISON:  CT of today.  CT of November 22, 2021. FINDINGS: Lower chest: No acute abnormality. Hepatobiliary: No focal liver abnormality is seen. No gallstones, gallbladder wall thickening, or biliary dilatation. Pancreas: Unremarkable. No pancreatic ductal dilatation or surrounding inflammatory changes. Spleen: Normal in size without focal abnormality. Adrenals/Urinary Tract: Adrenal glands are unremarkable. Kidneys are normal, without renal calculi, focal lesion, or hydronephrosis. Bladder is unremarkable. Stomach/Bowel: Moderate size sliding-type hiatal hernia is noted. There is no evidence of bowel obstruction or inflammation. Vascular/Lymphatic: Aortic atherosclerosis. No enlarged abdominal or pelvic lymph nodes. Reproductive: Status post hysterectomy. No adnexal masses. Other: No abdominal wall hernia or abnormality. No abdominopelvic ascites. No pneumoperitoneum is noted. The abnormality in question on prior exam represented bowel. Musculoskeletal: Status post surgical posterior fusion extending from L2-S1. No acute osseous abnormality is noted. IMPRESSION: Moderate size sliding-type hiatal hernia. There is no evidence of pneumoperitoneum. No acute abnormality seen in the abdomen or pelvis. Aortic Atherosclerosis (ICD10-I70.0). Electronically Signed   By: Lupita Raider M.D.   On: 09/23/2022 15:02   DG Chest Port 1 View  Result Date: 09/22/2022 CLINICAL DATA:  Chest pain, difficulty breathing.  EXAM: PORTABLE CHEST 1 VIEW COMPARISON:  None Available. FINDINGS: Heart is enlarged, unchanged. Both lungs are clear. The visualized skeletal structures are unremarkable. IMPRESSION: No active disease. Cardiomegaly. Electronically Signed   By: Darliss Cheney M.D.   On: 09/22/2022 17:44     Subjective: - no chest pain, shortness of breath, no abdominal pain, nausea or vomiting.   Discharge Exam: BP (!) 161/65 (BP Location: Left Arm)   Pulse 71   Temp 98.3 F (36.8 C) (Oral)   Resp 18   Ht 5\' 2"  (1.575 m)   Wt 69.2 kg   SpO2 98%   BMI 27.91 kg/m   General: Pt is alert, awake, not in acute distress Cardiovascular: RRR, S1/S2 +, no rubs, no gallops Respiratory: CTA bilaterally, no wheezing, no rhonchi Abdominal: Soft, NT, ND, bowel sounds + Extremities: no edema, no cyanosis   The results of significant diagnostics from this hospitalization (including imaging, microbiology, ancillary and laboratory) are listed below for reference.     Microbiology: Recent Results (from the past 240 hour(s))  Resp Panel by RT-PCR (Flu A&B, Covid) Anterior Nasal Swab     Status: None   Collection Time: 09/22/22  5:28 PM   Specimen: Anterior Nasal  Swab  Result Value Ref Range Status   SARS Coronavirus 2 by RT PCR NEGATIVE NEGATIVE Final    Comment: (NOTE) SARS-CoV-2 target nucleic acids are NOT DETECTED.  The SARS-CoV-2 RNA is generally detectable in upper respiratory specimens during the acute phase of infection. The lowest concentration of SARS-CoV-2 viral copies this assay can detect is 138 copies/mL. A negative result does not preclude SARS-Cov-2 infection and should not be used as the sole basis for treatment or other patient management decisions. A negative result may occur with  improper specimen collection/handling, submission of specimen other than nasopharyngeal swab, presence of viral mutation(s) within the areas targeted by this assay, and inadequate number of viral copies(<138  copies/mL). A negative result must be combined with clinical observations, patient history, and epidemiological information. The expected result is Negative.  Fact Sheet for Patients:  BloggerCourse.com  Fact Sheet for Healthcare Providers:  SeriousBroker.it  This test is no t yet approved or cleared by the [place] FDA and  has been authorized for detection and/or diagnosis of SARS-CoV-2 by FDA under an Emergency Use Authorization (EUA). This EUA will remain  in effect (meaning this test can be used) for the duration of the COVID-19 declaration under Section 564(b)(1) of the Act, 21 U.S.C.section 360bbb-3(b)(1), unless the authorization is terminated  or revoked sooner.       Influenza A by PCR NEGATIVE NEGATIVE Final   Influenza B by PCR NEGATIVE NEGATIVE Final    Comment: (NOTE) The Xpert Xpress SARS-CoV-2/FLU/RSV plus assay is intended as an aid in the diagnosis of influenza from Nasopharyngeal swab specimens and should not be used as a sole basis for treatment. Nasal washings and aspirates are unacceptable for Xpert Xpress SARS-CoV-2/FLU/RSV testing.  Fact Sheet for Patients: BloggerCourse.com  Fact Sheet for Healthcare Providers: SeriousBroker.it  This test is not yet approved or cleared by the [place] FDA and has been authorized for detection and/or diagnosis of SARS-CoV-2 by FDA under an Emergency Use Authorization (EUA). This EUA will remain in effect (meaning this test can be used) for the duration of the COVID-19 declaration under Section 564(b)(1) of the Act, 21 U.S.C. section 360bbb-3(b)(1), unless the authorization is terminated or revoked.  Performed at [facility], [address], [place], [place] [address]      Labs: Basic Metabolic Panel: Recent Labs  Lab 09/23/22 0201 09/23/22 0940 09/24/22 0025 09/25/22 0046 09/26/22 0028  09/27/22 0041 09/28/22 0712 09/29/22 0103  NA 137  --  137 135 137 138 139 141  K 4.3  --  4.9 4.7 4.7 4.6 4.2 4.4  CL 100  --  102 100 103 105 104 106  CO2 26  --  GLUCOSE 95  --  150* 113* 103* 103* 88 95  BUN 13  --  26* 19 15  CREATININE 0.94  --  0.89 1.02* 1.14* 1.31* 0.87 0.89  CALCIUM 8.7*  --  9.1 8.7* 8.6* 8.8* 8.8* 8.7*  MG 2.3  --  2.4 2.4 2.1  --   --   --   PHOS  --  2.9 3.4 4.1  --   --   --   --    Liver Function Tests: Recent Labs  Lab 09/22/22 1705 09/24/22 0025 09/25/22 0046  AST ALT ALKPHOS 64 58 57  BILITOT 0.6 0.3 0.4  PROT 5.8* 5.8* 5.7*  ALBUMIN 3.1* 3.3* 3.0*  CBC: Recent Labs  Lab 09/22/22 1705 09/22/22 1719 09/23/22 0940 09/24/22 0025 09/25/22 0046 09/26/22 0028 09/27/22 0041  WBC 12.5*  --  11.0* 12.4* 10.9* 9.3 10.5  NEUTROABS 10.2*  --  8.7* 9.7* 7.8*  --   --   HGB 9.8*   < > 10.1* 10.0* 10.0* 9.3* 8.7*  HCT 30.5*   < > 31.5* 32.2* 31.3* 28.6* 26.8*  MCV 93.0  --  92.9 94.2 91.5 91.7 92.4  PLT 339  --  352 354 364 319 323   < > = values in this interval not displayed.   CBG: No results for input(s): "GLUCAP" in the last 168 hours. Hgb A1c No results for input(s): "HGBA1C" in the last 72 hours. Lipid Profile Recent Labs    09/28/22 0656  CHOL 175  HDL 51  LDLCALC 99  TRIG 126  CHOLHDL 3.4   Thyroid function studies No results for input(s): "TSH", "T4TOTAL", "T3FREE", "THYROIDAB" in the last 72 hours.  Invalid input(s): "FREET3" Urinalysis    Component Value Date/Time   COLORURINE YELLOW 10/16/2021 1406   APPEARANCEUR Cloudy (A) 10/16/2021 1406   LABSPEC 1.020 10/16/2021 1406   PHURINE 5.5 10/16/2021 1406   GLUCOSEU NEGATIVE 10/16/2021 1406   HGBUR TRACE-INTACT (A) 10/16/2021 1406   HGBUR negative 07/19/2009 1509   BILIRUBINUR NEGATIVE 10/16/2021 1406   BILIRUBINUR neg 04/12/2020 1056   KETONESUR TRACE (A) 10/16/2021 1406   PROTEINUR Positive (A) 04/12/2020 1056    PROTEINUR NEGATIVE 08/17/2019 0457   UROBILINOGEN 0.2 10/16/2021 1406   NITRITE NEGATIVE 10/16/2021 1406   LEUKOCYTESUR MODERATE (A) 10/16/2021 1406    FURTHER DISCHARGE INSTRUCTIONS:   Get Medicines reviewed and adjusted: Please take all your medications with you for your next visit with your Primary MD   Laboratory/radiological data: Please request your Primary MD to go over all hospital tests and procedure/radiological results at the follow up, please ask your Primary MD to get all Hospital records sent to his/her office.   In some cases, they will be blood work, cultures and biopsy results pending at the time of your discharge. Please request that your primary care M.D. goes through all the records of your hospital data and follows up on these results.   Also Note the following: If you experience worsening of your admission symptoms, develop shortness of breath, life threatening emergency, suicidal or homicidal thoughts you must seek medical attention immediately by calling 911 or calling your MD immediately  if symptoms less severe.   You must read complete instructions/literature along with all the possible adverse reactions/side effects for all the Medicines you take and that have been prescribed to you. Take any new Medicines after you have completely understood and accpet all the possible adverse reactions/side effects.    Do not drive when taking Pain medications or sleeping medications (Benzodaizepines)   Do not take more than prescribed Pain, Sleep and Anxiety Medications. It is not advisable to combine anxiety,sleep and pain medications without talking with your primary care practitioner   Special Instructions: If you have smoked or chewed Tobacco  in the last 2 yrs please stop smoking, stop any regular Alcohol  and or any Recreational drug use.   Wear Seat belts while driving.   Please note: You were cared for by a hospitalist during your hospital stay. Once you are  discharged, your primary care physician will handle any further medical issues. Please note that NO REFILLS for any discharge medications will be authorized once you are discharged,  as it is imperative that you return to your primary care physician (or establish a relationship with a primary care physician if you do not have one) for your post hospital discharge needs so that they can reassess your need for medications and monitor your lab values.  Time coordinating discharge: 40 minutes  SIGNED:  Pamella Pert, MD, PhD 09/29/2022, 7:41 AM

## 2022-09-29 NOTE — TOC Transition Note (Signed)
Transition of Care [facility]) - CM/SW Discharge Note   Patient Details  Name: Julie Rice MRN: [id number] Date of Birth: 1936-06-02  [facility] CM/SW Contact:  Patrice Paradise, LCSW Phone Number: 09/29/2022, 11:03 AM   Clinical Narrative:     Patient will DC to:?[facility] Anticipated DC date:?09/29/2022 Family notified:?Pt to notify Transport by: Sharin Mons   Per MD patient ready for DC to [facility]. RN, patient, patient's family, and facility notified of DC. Discharge Summary sent to facility. RN given number for report [phone] ask for April. DC packet on chart. Ambulance transport requested for patient.   CSW signing off.   Judd Lien, [place] [phone]   Final next level of care: Skilled Nursing Facility Barriers to Discharge: Barriers Resolved   Patient Goals and CMS Choice        Discharge Placement              Patient chooses bed at: [facility] [place] Patient to be transferred to facility by: [facility] Name of family member notified: Kathlene November Patient and family notified of of transfer: 09/29/22  Discharge Plan and Services                                     Social Determinants of Health (SDOH) Interventions     Readmission Risk Interventions   No data to display

## 2022-09-29 NOTE — Progress Notes (Signed)
RN spoke to [employer], grandson to inform the patient is discharging to [facility]. RN spoke to April RN and provided report. [facility] RN had no questions for RN to answer. Tele monitor removed and CCMD notified. NT removed PIV and site is clean dry and intact. NT packed up belongings. [facility] has been called for transportation.

## 2022-09-29 NOTE — Plan of Care (Signed)
  Problem: Health Behavior/Discharge Planning: Goal: Ability to manage health-related needs will improve Outcome: Progressing   Problem: Clinical Measurements: Goal: Ability to maintain clinical measurements within normal limits will improve Outcome: Progressing Goal: Will remain free from infection Outcome: Progressing   

## 2022-09-30 ENCOUNTER — Telehealth: Payer: Self-pay | Admitting: *Deleted

## 2022-09-30 LAB — HEMOGLOBIN A1C
Hgb A1c MFr Bld: 5.5 % (ref 4.8–5.6)
Mean Plasma Glucose: 111 mg/dL

## 2022-09-30 NOTE — Telephone Encounter (Signed)
1st attempt to call the patient. Left message on machine for patient to return our call 

## 2022-10-01 ENCOUNTER — Telehealth: Payer: Self-pay

## 2022-10-01 NOTE — Telephone Encounter (Signed)
-----   Message from Hilarie Fredrickson, MD sent at 09/27/2022  2:04 PM EST ----- Regarding: RE: Hospital follow-up Thanks Sherilyn Cooter!  Linda, OV 6-8 weeks. 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  She had recent recurrence of dysphagia, so I did her upper endoscopy with Botox injection.  Was easier to get done as inpatient due to her medical issues.  She needs office follow-up with you in about 6 to 8 weeks.  HD

## 2022-10-01 NOTE — Telephone Encounter (Signed)
2nd attempt to call the patient Unable to leave a message. 

## 2022-10-01 NOTE — Telephone Encounter (Signed)
F/U on Tuesday, 11/19/22 at 2:20 pm with Dr. [name]. Appt information mailed and sent to patient via MyChart.

## 2022-10-02 DIAGNOSIS — D6869 Other thrombophilia: Secondary | ICD-10-CM | POA: Diagnosis not present

## 2022-10-02 DIAGNOSIS — I480 Paroxysmal atrial fibrillation: Secondary | ICD-10-CM | POA: Diagnosis not present

## 2022-10-02 DIAGNOSIS — J449 Chronic obstructive pulmonary disease, unspecified: Secondary | ICD-10-CM | POA: Diagnosis not present

## 2022-10-02 DIAGNOSIS — I509 Heart failure, unspecified: Secondary | ICD-10-CM | POA: Diagnosis not present

## 2022-10-10 ENCOUNTER — Ambulatory Visit: Payer: Medicare Other | Admitting: Cardiology

## 2022-10-13 DIAGNOSIS — D6869 Other thrombophilia: Secondary | ICD-10-CM | POA: Diagnosis not present

## 2022-10-13 DIAGNOSIS — I509 Heart failure, unspecified: Secondary | ICD-10-CM | POA: Diagnosis not present

## 2022-10-13 DIAGNOSIS — I480 Paroxysmal atrial fibrillation: Secondary | ICD-10-CM | POA: Diagnosis not present

## 2022-10-13 DIAGNOSIS — J449 Chronic obstructive pulmonary disease, unspecified: Secondary | ICD-10-CM | POA: Diagnosis not present

## 2022-10-21 ENCOUNTER — Ambulatory Visit (HOSPITAL_COMMUNITY): Payer: Medicare Other | Attending: Internal Medicine

## 2022-10-21 DIAGNOSIS — I2721 Secondary pulmonary arterial hypertension: Secondary | ICD-10-CM | POA: Insufficient documentation

## 2022-10-21 LAB — ECHOCARDIOGRAM COMPLETE
Area-P 1/2: 2.88 cm2
P 1/2 time: 676 msec
S' Lateral: 2.9 cm

## 2022-10-22 ENCOUNTER — Ambulatory Visit (INDEPENDENT_AMBULATORY_CARE_PROVIDER_SITE_OTHER): Payer: Medicare Other | Admitting: Adult Health

## 2022-10-22 ENCOUNTER — Encounter: Payer: Self-pay | Admitting: Adult Health

## 2022-10-22 VITALS — BP 130/60 | HR 59 | Temp 98.1°F

## 2022-10-22 DIAGNOSIS — I5022 Chronic systolic (congestive) heart failure: Secondary | ICD-10-CM

## 2022-10-22 DIAGNOSIS — I639 Cerebral infarction, unspecified: Secondary | ICD-10-CM

## 2022-10-22 DIAGNOSIS — N179 Acute kidney failure, unspecified: Secondary | ICD-10-CM

## 2022-10-22 DIAGNOSIS — I480 Paroxysmal atrial fibrillation: Secondary | ICD-10-CM

## 2022-10-22 DIAGNOSIS — F411 Generalized anxiety disorder: Secondary | ICD-10-CM

## 2022-10-22 DIAGNOSIS — R1319 Other dysphagia: Secondary | ICD-10-CM | POA: Diagnosis not present

## 2022-10-22 DIAGNOSIS — J449 Chronic obstructive pulmonary disease, unspecified: Secondary | ICD-10-CM

## 2022-10-22 DIAGNOSIS — D649 Anemia, unspecified: Secondary | ICD-10-CM

## 2022-10-22 DIAGNOSIS — Z8673 Personal history of transient ischemic attack (TIA), and cerebral infarction without residual deficits: Secondary | ICD-10-CM | POA: Diagnosis not present

## 2022-10-22 DIAGNOSIS — H538 Other visual disturbances: Secondary | ICD-10-CM

## 2022-10-22 DIAGNOSIS — I10 Essential (primary) hypertension: Secondary | ICD-10-CM

## 2022-10-22 DIAGNOSIS — D72829 Elevated white blood cell count, unspecified: Secondary | ICD-10-CM

## 2022-10-22 LAB — CBC WITH DIFFERENTIAL/PLATELET
Basophils Absolute: 0.1 10*3/uL (ref 0.0–0.1)
Basophils Relative: 0.7 % (ref 0.0–3.0)
Eosinophils Absolute: 0.2 10*3/uL (ref 0.0–0.7)
Eosinophils Relative: 2.3 % (ref 0.0–5.0)
HCT: 31.8 % — ABNORMAL LOW (ref 36.0–46.0)
Hemoglobin: 10.4 g/dL — ABNORMAL LOW (ref 12.0–15.0)
Lymphocytes Relative: 11.7 % — ABNORMAL LOW (ref 12.0–46.0)
Lymphs Abs: 1.1 10*3/uL (ref 0.7–4.0)
MCHC: 32.6 g/dL (ref 30.0–36.0)
MCV: 93.3 fl (ref 78.0–100.0)
Monocytes Absolute: 0.6 10*3/uL (ref 0.1–1.0)
Monocytes Relative: 6.3 % (ref 3.0–12.0)
Neutro Abs: 7.4 10*3/uL (ref 1.4–7.7)
Neutrophils Relative %: 79 % — ABNORMAL HIGH (ref 43.0–77.0)
Platelets: 299 10*3/uL (ref 150.0–400.0)
RBC: 3.41 Mil/uL — ABNORMAL LOW (ref 3.87–5.11)
RDW: 18 % — ABNORMAL HIGH (ref 11.5–15.5)
WBC: 9.4 10*3/uL (ref 4.0–10.5)

## 2022-10-22 LAB — BASIC METABOLIC PANEL
BUN: 23 mg/dL (ref 6–23)
CO2: 31 mEq/L (ref 19–32)
Calcium: 9.2 mg/dL (ref 8.4–10.5)
Chloride: 101 mEq/L (ref 96–112)
Creatinine, Ser: 0.93 mg/dL (ref 0.40–1.20)
GFR: 56.33 mL/min — ABNORMAL LOW (ref >60.00)
Glucose, Bld: 94 mg/dL (ref 70–99)
Potassium: 4.9 mEq/L (ref 3.5–5.1)
Sodium: 138 mEq/L (ref 135–145)

## 2022-10-22 NOTE — Patient Instructions (Signed)
It was great seeing you today and I am glad you are doing better.   Please continue with your exercises   We will follow up with you regarding your lab work

## 2022-10-22 NOTE — Progress Notes (Signed)
Subjective:    Patient ID: Julie Rice, female    DOB: 07-Nov-1935, 86 y.o.   MRN: [id number]  HPI 86 year old female who  has a past medical history of A-fib (HCC), Anemia, Anxiety, Bipolar disorder (HCC), Blood transfusion (1991), CAD (coronary artery disease), Cataract, Chest pain, CHF (congestive heart failure) (HCC) (07/2020), Chronic back pain, COPD (chronic obstructive pulmonary disease) (HCC), Depression, Esophageal stricture, Fibromyalgia, GERD (gastroesophageal reflux disease), Heart murmur, Hiatal hernia, Hyperlipidemia, Hypertension, Internal hemorrhoids, Lumbago, Mitral regurgitation, Osteoarthritis, Osteoporosis, Pneumonia (~ 01/2018), PONV (postoperative nausea and vomiting), Rectal bleeding, Rheumatoid arthritis (HCC), Spondylosis, TIA (transient ischemic attack) (2013), and Urinary incontinence.  She presents to the office today for TCM visit   Admit Date 09/22/2022 Discharge Date 09/29/2022 Discharge from [facility] 10/13/2022  He presented to the emergency room with a complaint of when she walks she would develop chest tightness and a burning sensation that went across her chest and down both of her elbows.  Symptoms started 10 days prior to being seen in the emergency room but were subsequently getting worse and worse.   She did not feel as though she needed increased oxygen at night.  On the day that she was seen she was walking a few steps and had severe chest burning and palpitations he could not stand straight so she decided to come to the emergency room.  In the ER her cardiac enzymes were negative x 3.  She was tachycardiac with frequent PVCs.  Atrial fibrillation noted.  An attempt was made to walk her and she became extremely tachycardic and dyspneic.  She was admitted to the hospitalist  Hospital Course  SVT/PAF -wide-complex tachycardia.  Cardiology/EP was consulted and evaluated the patient.  She was started on amiodarone and continued with metoprolol.  She was in sinus  rhythm and then felt much better.  Upon discharge she was continued with Eliquis, amiodarone, and metoprolol  Acute CVA-noticed on MRI 09/27/2022, small acute infarct in the left inferior cerebellum.  Neurology was consulted and evaluated patient while hospitalized.  This may have happened while she was off Eliquis for 2 days in preparation for EGD.  MRA without significant LVO.  That ultrasound showed no significant plaque early.  Echo showed EF of 60 to 65% the left atrium severely dilated, mild PAH.  Dysphagia/chronic esophageal strictures-symptoms, GI consulted.  She underwent an endoscopy which showed benign-appearing esophageal stenosis status post botulism toxin injection.  Did not undergo dilation due to degree of esophagitis.  Advised to continue PPI  Blurry Vision -on and off, neurology concerned about temporal arteritis.  CRP not elevated, sed rate slightly high.  Temporal artery ultrasound without signs of temporal arteritis.  She may need an ophthalmology referral as an outpatient if this persists  Mild AKI -resolved.  Spironolactone on hold upon discharge until repeat blood work and consider resumption if stable  Chronic systolic  CHF-appeared euvolemic throughout hospital admission  Hypertension-metoprolol, blood pressure stable throughout admission.  She was continued on home medications except for spironolactone  Generalized anxiety disorder-continued on Wellbutrin, Klonopin, duloxetine  COPD with chronic hypoxic respiratory failure-only wears oxygen at night.  Advise follow-up with pulmonary outpatient  Leukocytosis - white count stable   Mild nomocytic anemia - of chronic disease, no bleeding.   Today in the office she is with a close friend.  She reports that she feels improved overall, she felt as though she got great care at collapse rehab facility and would like to return to live there.  In regards to SVT/PAF she reports that she has had some very mild intermittent  palpitations that quickly resolved.  She has not had any chest pain, the burning sensation she felt with ambulation walking, or the feeling he was going to pass out.  Has not noticed any deficits status post CVA.  Her friend has not noticed any facial droop, slurred speech, or weakness in extremities.  She is using a walker to get around her house and has been doing her chair exercises daily sometimes twice a day.  It is noticeable in the office today that her spirits are higher than they have been in the past, her friend also endorses this and has noticed that she is happier overall.  Continues to have intermittent blurred vision, did have a eye appointment scheduled when she was in the hospital but this has since been rescheduled.  She has not had any lower extremity edema since stopping spironolactone  Has no acute complaints today  Review of Systems  Constitutional: Negative.   HENT: Negative.    Eyes:  Positive for visual disturbance.  Cardiovascular: Negative.   Gastrointestinal: Negative.   Endocrine: Negative.   Genitourinary: Negative.   Musculoskeletal:  Positive for arthralgias, back pain and gait problem.  Skin: Negative.   Allergic/Immunologic: Negative.   Neurological:  Positive for weakness.  Hematological: Negative.   Psychiatric/Behavioral: Negative.     Past Medical History:  Diagnosis Date   A-fib (HCC)    Anemia    Anxiety    Bipolar disorder (HCC)    Blood transfusion 1991   autologous pts own blood given    CAD (coronary artery disease)    Cataract    Chest pain    "@ rest, lying down, w/exertion"   CHF (congestive heart failure) (HCC) 07/2020   Chronic back pain    "mostly lower back but I do have upper back pain regularly" (04/06/2018)   COPD (chronic obstructive pulmonary disease) (HCC)    Depression    Esophageal stricture    Fibromyalgia    GERD (gastroesophageal reflux disease)    Heart murmur    "slight" (04/06/2018)   Hiatal hernia     Hyperlipidemia    Patient denies   Hypertension    Internal hemorrhoids    Lumbago    Mitral regurgitation    Osteoarthritis    Osteoporosis    Pneumonia ~ 01/2018   PONV (postoperative nausea and vomiting)    severe ponv, "in the past" (04/06/2018)   Rectal bleeding    Rheumatoid arthritis (HCC)    Spondylosis    TIA (transient ischemic attack) 2013   Urinary incontinence    wears depends    Social History   Socioeconomic History   Marital status: Widowed    Spouse name: Not on file   Number of children: 2   Years of education: 69   Highest education level: 12th grade  Occupational History   Occupation: retired    Associate Professor: RETIRED  Tobacco Use   Smoking status: Never   Smokeless tobacco: Never  Vaping Use   Vaping Use: Never used  Substance and Sexual Activity   Alcohol use: Never   Drug use: Never   Sexual activity: Not Currently    Comment: 1st intercourse 70 yo-1 partner  Other Topics Concern   Not on file  Social History Narrative   Retired    Widowed   Right handed   Social Determinants of Health   Financial Resource Strain: Low Risk  (  02/14/2022)   Overall Financial Resource Strain (CARDIA)    Difficulty of Paying Living Expenses: Not hard at all  Food Insecurity: No Food Insecurity (09/23/2022)   Hunger Vital Sign    Worried About Running Out of Food in the Last Year: Never true    Ran Out of Food in the Last Year: Never true  Transportation Needs: No Transportation Needs (09/23/2022)   PRAPARE - Administrator, Civil Service (Medical): No    Lack of Transportation (Non-Medical): No  Physical Activity: Inactive (02/14/2022)   Exercise Vital Sign    Days of Exercise per Week: 0 days    Minutes of Exercise per Session: 0 min  Stress: No Stress Concern Present (02/14/2022)   [employer] of Occupational Health - Occupational Stress Questionnaire    Feeling of Stress : Not at all  Social Connections: Moderately Integrated (02/14/2022)    Social Connection and Isolation Panel [NHANES]    Frequency of Communication with Friends and Family: More than three times a week    Frequency of Social Gatherings with Friends and Family: More than three times a week    Attends Religious Services: More than 4 times per year    Active Member of [employer] or Organizations: Yes    Attends Banker Meetings: More than 4 times per year    Marital Status: Widowed  Intimate Partner Violence: Not At Risk (09/23/2022)   Humiliation, Afraid, Rape, and Kick questionnaire    Fear of Current or Ex-Partner: No    Emotionally Abused: No    Physically Abused: No    Sexually Abused: No    Past Surgical History:  Procedure Laterality Date   ABDOMINAL HYSTERECTOMY  1982   BACK SURGERY     BALLOON DILATION N/A 09/21/2018   Procedure: BALLOON DILATION;  Surgeon: Hilarie Fredrickson, MD;  Location: [facility] ENDOSCOPY;  Service: Endoscopy;  Laterality: N/A;   BALLOON DILATION N/A 08/12/2019   Procedure: BALLOON DILATION;  Surgeon: Lynann Bologna, MD;  Location: [facility] ENDOSCOPY;  Service: Endoscopy;  Laterality: N/A;   BIOPSY  08/12/2019   Procedure: BIOPSY;  Surgeon: Lynann Bologna, MD;  Location: [facility] ENDOSCOPY;  Service: Endoscopy;;   BLADDER SUSPENSION  1980's   BOTOX INJECTION N/A 09/21/2018   Procedure: BOTOX INJECTION;  Surgeon: Hilarie Fredrickson, MD;  Location: [facility] ENDOSCOPY;  Service: Endoscopy;  Laterality: N/A;   BOTOX INJECTION N/A 08/12/2019   Procedure: BOTOX INJECTION;  Surgeon: Lynann Bologna, MD;  Location: [facility] ENDOSCOPY;  Service: Endoscopy;  Laterality: N/A;   BOTOX INJECTION N/A 09/27/2022   Procedure: BOTOX INJECTION;  Surgeon: Sherrilyn Rist, MD;  Location: [facility] ENDOSCOPY;  Service: Gastroenterology;  Laterality: N/A;   CATARACT EXTRACTION W/ INTRAOCULAR LENS  IMPLANT, BILATERAL Bilateral 2010   DILATION AND CURETTAGE OF UTERUS  1961   ESOPHAGEAL MANOMETRY N/A 03/25/2018   Procedure: ESOPHAGEAL MANOMETRY (EM);  Surgeon: Napoleon Form, MD;   Location: [facility] ENDOSCOPY;  Service: Endoscopy;  Laterality: N/A;   ESOPHAGOGASTRODUODENOSCOPY (EGD) WITH PROPOFOL N/A 04/07/2018   Procedure: ESOPHAGOGASTRODUODENOSCOPY (EGD) WITH PROPOFOL;  Surgeon: Sherrilyn Rist, MD;  Location: [facility] ENDOSCOPY;  Service: Gastroenterology;  Laterality: N/A;   ESOPHAGOGASTRODUODENOSCOPY (EGD) WITH PROPOFOL N/A 09/21/2018   Procedure: ESOPHAGOGASTRODUODENOSCOPY (EGD) WITH PROPOFOL;  Surgeon: Hilarie Fredrickson, MD;  Location: [facility] ENDOSCOPY;  Service: Endoscopy;  Laterality: N/A;   ESOPHAGOGASTRODUODENOSCOPY (EGD) WITH PROPOFOL N/A 08/12/2019   Procedure: ESOPHAGOGASTRODUODENOSCOPY (EGD) WITH PROPOFOL;  Surgeon: Lynann Bologna, MD;  Location: [facility] ENDOSCOPY;  Service: Endoscopy;  Laterality: N/A;   ESOPHAGOGASTRODUODENOSCOPY (EGD) WITH PROPOFOL N/A 09/27/2022   Procedure: ESOPHAGOGASTRODUODENOSCOPY (EGD) WITH PROPOFOL;  Surgeon: Sherrilyn Rist, MD;  Location: [facility] ENDOSCOPY;  Service: Gastroenterology;  Laterality: N/A;   HERNIA REPAIR     HIATAL HERNIA REPAIR N/A 08/02/2016   Procedure: LAPAROSCOPIC REPAIR OF LARGE  HIATAL HERNIA;  Surgeon: Glenna Fellows, MD;  Location: [facility] ORS;  Service: General;  Laterality: N/A;   JOINT REPLACEMENT     LAPAROSCOPIC NISSEN FUNDOPLICATION N/A 08/02/2016   Procedure: LAPAROSCOPIC NISSEN FUNDOPLICATION;  Surgeon: Glenna Fellows, MD;  Location: [facility] ORS;  Service: General;  Laterality: N/A;   LUMBAR LAMINECTOMY  1990; 1994; [id number]   "I've got 2 stainless steel rods; 6 screws; 2 ray cages"took bone from right hip to put in back   RIGHT/LEFT HEART CATH AND CORONARY ANGIOGRAPHY N/A 07/26/2019   Procedure: RIGHT/LEFT HEART CATH AND CORONARY ANGIOGRAPHY;  Surgeon: Lyn Records, MD;  Location: [facility] INVASIVE CV LAB;  Service: Cardiovascular;  Laterality: N/A;   SVT ABLATION N/A 04/25/2021   Procedure: SVT ABLATION;  Surgeon: Regan Lemming, MD;  Location: [facility] INVASIVE CV LAB;  Service: Cardiovascular;  Laterality: N/A;   TEE WITHOUT  CARDIOVERSION N/A 08/19/2019   Procedure: TRANSESOPHAGEAL ECHOCARDIOGRAM (TEE);  Surgeon: Lewayne Bunting, MD;  Location: [facility] ENDOSCOPY;  Service: Cardiovascular;  Laterality: N/A;   TONSILLECTOMY AND ADENOIDECTOMY  1945   TOTAL KNEE ARTHROPLASTY Right ~ 1996   TUBAL LIGATION  ~ 1976    Family History  Problem Relation Age of Onset   Kidney disease Mother    Hypertension Mother    Heart disease Father 25       die of MI at age 50   Heart disease Brother    Heart attack Brother    Suicidality Son    Other Son        MVA   Colon cancer Neg Hx    Esophageal cancer Neg Hx    Pancreatic cancer Neg Hx    Rectal cancer Neg Hx    Stomach cancer Neg Hx     Allergies  Allergen Reactions   Tape Other (See Comments)    Band aides, adhesive tape Redness and pulls skin off   Tramadol Other (See Comments)    Pt has prolonged Qtc interval of 540- cannot give tramadol per pharmacy   Morphine Itching and Rash    Flushing    Current Outpatient Medications on File Prior to Visit  Medication Sig Dispense Refill   acetaminophen (TYLENOL) 500 MG tablet Take 1,000 mg by mouth every 6 (six) hours as needed for moderate pain, headache or fever.     albuterol (VENTOLIN HFA) 108 (90 Base) MCG/ACT inhaler TAKE 2 PUFFS BY MOUTH EVERY 6 HOURS AS NEEDED FOR WHEEZE OR SHORTNESS OF BREATH (Patient taking differently: Inhale 2 puffs into the lungs every 6 (six) hours as needed for wheezing or shortness of breath.) 8.5 each 2   amiodarone (PACERONE) 200 MG tablet Take 1 tablet (200 mg total) by mouth daily.     buPROPion (WELLBUTRIN XL) 300 MG 24 hr tablet TAKE 1 TABLET BY MOUTH EVERY DAY 90 tablet 1   Carboxymethylcellulose Sodium (REFRESH TEARS OP) Place 1 drop into both eyes 2 (two) times daily.     cetirizine (ZYRTEC) 10 MG tablet Take 10 mg by mouth daily as needed for allergies.     clonazePAM (KLONOPIN) 0.5 MG tablet Take 0.5 tablets (0.25 mg total) by mouth 2 (  two) times daily as needed for anxiety.  10 tablet 0   DULoxetine (CYMBALTA) 60 MG capsule TAKE 1 CAPSULE BY MOUTH EVERY DAY (Patient taking differently: Take 60 mg by mouth daily.) 90 capsule 3   ELIQUIS 5 MG TABS tablet TAKE 1 TABLET BY MOUTH TWICE A DAY (Patient taking differently: Take 5 mg by mouth 2 (two) times daily.) 180 tablet 1   FeFum-FePoly-FA-B Cmp-C-Biot (INTEGRA PLUS) CAPS Take 1 capsule by mouth every morning. 30 capsule 2   KLOR-CON M20 20 MEQ tablet TAKE 1 TABLET BY MOUTH EVERY DAY 90 tablet 2   losartan (COZAAR) 50 MG tablet TAKE 1 TABLET BY MOUTH EVERY DAY 90 tablet 3   metoprolol tartrate (LOPRESSOR) 25 MG tablet Take 0.5 tablets (12.5 mg total) by mouth 2 (two) times daily.     Multiple Vitamins-Minerals (ONE-A-DAY WOMENS 50+) TABS Take 1 tablet by mouth daily.     pantoprazole (PROTONIX) 40 MG tablet Take 1 tablet (40 mg total) by mouth daily.     pregabalin (LYRICA) 50 MG capsule Take 1 capsule (50 mg total) by mouth daily. 10 capsule 0   rosuvastatin (CRESTOR) 20 MG tablet Take 1 tablet (20 mg total) by mouth daily at 6 PM.     sodium chloride (OCEAN) 0.65 % SOLN nasal spray Place 1 spray into both nostrils as needed for congestion.     No current facility-administered medications on file prior to visit.    BP 130/60   Pulse (!) 59   Temp 98.1 F (36.7 C) (Oral)   SpO2 99%       Objective:   Physical Exam Vitals and nursing note reviewed.  Constitutional:      Appearance: Normal appearance.  Cardiovascular:     Rate and Rhythm: Normal rate and regular rhythm.     Pulses: Normal pulses.     Heart sounds: Normal heart sounds.  Pulmonary:     Effort: Pulmonary effort is normal.     Breath sounds: Normal breath sounds.  Musculoskeletal:        General: Normal range of motion.     Right lower leg: No edema.     Left lower leg: No edema.  Skin:    General: Skin is warm and dry.  Neurological:     General: No focal deficit present.     Mental Status: She is alert and oriented to person, place,  and time.     Motor: Weakness present.     Gait: Gait abnormal.  Psychiatric:        Mood and Affect: Mood normal.        Behavior: Behavior normal.        Thought Content: Thought content normal.        Judgment: Judgment normal.        Assessment & Plan:  1. PAF (paroxysmal atrial fibrillation) (HCC) - Reviewed hospital notes, discharge instructions, labs, imaging, and medication changes with the patient.  All questions answered to the best of my ability - Sinus rhythm today  - CBC with Differential/Platelet; Future - Basic Metabolic Panel; Future - Basic Metabolic Panel - CBC with Differential/Platelet  2. Acute CVA (cerebrovascular accident) (HCC) - No neuro deficits noted.  - Continue with primary prevention with statin and eliquis  - CBC with Differential/Platelet; Future - Basic Metabolic Panel; Future - Basic Metabolic Panel - CBC with Differential/Platelet  3. Esophageal dysphagia - Per GI  - CBC with Differential/Platelet; Future - Basic Metabolic Panel; Future -  Basic Metabolic Panel - CBC with Differential/Platelet  4. Blurry vision, bilateral - Follow up with her eye doctor   5. AKI (acute kidney injury) (HCC)  - CBC with Differential/Platelet; Future - Basic Metabolic Panel; Future - Basic Metabolic Panel - CBC with Differential/Platelet  6. Chronic systolic congestive heart failure (HCC) - Euvolemic today.  - Continue Metoprolol - Consider adding back Spirolactone  - CBC with Differential/Platelet; Future - Basic Metabolic Panel; Future - Basic Metabolic Panel - CBC with Differential/Platelet  7. Essential hypertension - Consider adding back spirolactone  - CBC with Differential/Platelet; Future - Basic Metabolic Panel; Future - Basic Metabolic Panel - CBC with Differential/Platelet  8. Generalized anxiety disorder - Continue with wellbutrin, Cymbalta and klonopin  - CBC with Differential/Platelet; Future - Basic Metabolic Panel;  Future - Basic Metabolic Panel - CBC with Differential/Platelet  9. Chronic obstructive pulmonary disease, unspecified COPD type (HCC) - Continue with oxygen therapy at night  - Follow up with pulmonary as directed  - CBC with Differential/Platelet; Future - Basic Metabolic Panel; Future - Basic Metabolic Panel - CBC with Differential/Platelet  10. Leukocytosis, unspecified type  - CBC with Differential/Platelet; Future - Basic Metabolic Panel; Future - Basic Metabolic Panel - CBC with Differential/Platelet  11. Normocytic anemia  - CBC with Differential/Platelet; Future - Basic Metabolic Panel; Future - Basic Metabolic Panel - CBC with Differential/Platelet   Shirline Frees, NP

## 2022-11-07 ENCOUNTER — Telehealth: Payer: Self-pay | Admitting: Internal Medicine

## 2022-11-07 NOTE — Telephone Encounter (Signed)
Called patient regarding upcoming January appointments, patient is notified. 

## 2022-11-18 ENCOUNTER — Ambulatory Visit: Payer: Medicare Other | Admitting: Physician Assistant

## 2022-11-18 VITALS — BP 119/70 | HR 66 | Resp 18 | Ht 62.0 in | Wt 151.0 lb

## 2022-11-18 DIAGNOSIS — E611 Iron deficiency: Secondary | ICD-10-CM

## 2022-11-18 DIAGNOSIS — E538 Deficiency of other specified B group vitamins: Secondary | ICD-10-CM

## 2022-11-18 DIAGNOSIS — G609 Hereditary and idiopathic neuropathy, unspecified: Secondary | ICD-10-CM

## 2022-11-18 NOTE — Patient Instructions (Signed)
Continue follow up with  Hematology  iron deficiency and B12 deficiency  No tylenol, is bad for your liver in large quantities  Continue pain clinic      Consider Reumathology referral for reumathoid arthritis and fibromyalgia  Resume visit with Neurosurgery for lower back pain/ PT  /pain contro Continue Cardiology follow up for congestive hear failure .  Increase the water intake Continue the blood thinner for stroke prevention  Return to  follow up  in 6 months

## 2022-11-18 NOTE — Progress Notes (Signed)
Neurology clinic note 08/15/2022  ASSESSMENT:  Julie Rice is a 87 y.o. female with a history of hypertension, essential tremor, anemia, rheumatoid arthritis, fibromyalgia nonobstructive coronary artery disease, chronic systolic heart failure, fibromyalgia, chronic back pain with multiple back surgeries, BP disorder, hypertension, atrial fibrillation on AC , TIA, COPD, esophageal strictures, anxiety, depression, COPD who presents  in follow up for evaluation of peripheral neuropathy.  Neurological examination is pertinent for decreased sensation in all her toes, worse in the great toe, decreased vibration on the great toes, neuropathic pain.  She has tried gabapentin which was discontinued due to worsening memory, and she is off Lyrica due to no therapeutic benefit.  Since her last visit patient was hospitalized on 09/28/22 due to L cerebellar small infarct due to Afib off Eliquis as she was preparing for EGD, asymptomatic at this time although she experienced short lasting R blurry vision and R temporoparietal headache without recurrence ( workup negative for Temporal Arteritis)  . She is back on Eliquis, compliant with the medication.   Labs are remarkable for iron saturation of 10, in MAM of 348.  Patient likely is iron deficient, as well as B12 deficient.  She does report ice chip craving (?  [place]), to be followed at the [facility], hs an appointment soon   Recommendations  Continue to replenish B12 at 1000 mcg daily (MAM 348 indicative of B12 deficiency)   Continue iron replenishment , follow at the [facility] visit with Neurosurgery for chronic lower back pain and pain clinic. Was instructed to discuss at the pain clinic other options such as topical agents  Continue PT  Stop excessive Tylenol consumption in view of slightly abnormal IgM 40 ( range 49-201, inflammatory liver disease risk, possible coagulation risk in view of her recent nosebleeds and bruising).  Patient  currently consumes 2000 mg every 4-6 hours for pain.( Instead or prior 3000 mg q 4-6)  At this time, no indication for invasive procedures such as EMG/NCS in view of other multiple medical issues and advanced age Continue Eliquis for secondary stroke prevention/Afib  Consider Rheumatology referral for RA/Fibromyalgia/abnl ESR  Follow up in 6 months    HPI:    She continues to take Tylenol at 2000 mg q 4-6 hrs, ( was taking 3000 mg q 4-6) Muscle bulk loss?  no  Muscle pain?  Sometimes Cramps/Twitching?   "Sometimes"   Fatigable weakness? no  Does strength improve after brief exercise?  Does 2 x a week  .  Able to brush hair/teeth without difficulty? Yes   Able to button shirts/use zips? Some discomfort to button  Clumsiness/dropping grasped objects? No Can you arise from squatted position easily? I have to push on the chair   Able to get out of chair without using arms? No   Able to walk up steps easily?:  Uses a walker at home, or R cane to go out    Significant imbalance with walking? Endorsed , especially in the morning I wobble but exercise help Fall? Not recently  Any change in urine color, especially after exertion/physical activity? no  The patient denies  diplopia, ptosis, had recent blurred vision, unclear if it was due to embolic cerebellar stroke, not present during this visit  She complains of chronic  dysphagia with meats (GI following)  Denies  poor saliva control, dysarthria/dysphonia only when waking up , impaired mastication, facial weakness/droop. The patient denies  impaired sweating, heat or cold intolerance, excessive mucosal dryness, gastroparetic early satiety, denies constipation She has incontinence and uses pads  She has a history of low blood pressure   The patient has not  noticed any recent skin rashes  nor does she  report any constitutional symptoms like fever, night sweats, anorexia or unintentional weight loss.      HPI:   Had neuropathy for 1 year,  and she began taking tylenol and gabapentin. Tylenol currently is at 3 tabs q 6 h and d/c'd gabapentin due to memory issues with the med. She began Lyrica x 1 month, and has been on Cymbalta for the last  6 mo  Muscle bulk loss?  no  Muscle pain?  sometimes Cramps/Twitching?  not that often "if my potassium is low" Suggestion of myotonia/difficulty relaxing after contraction?  no  Fatigable weakness? no  Does strength improve after brief exercise? she does not exercise  Able to brush hair/teeth without difficulty? Yes   Able to button shirts/use zips? Endorsed   Clumsiness/dropping grasped objects? No Can you arise from squatted position easily? I have to push on the chair   Able to get out of chair without using arms? No   Able to walk up steps easily?: Not really " I have to pick up the leg because it helps  Use an assistive device to walk?  R cane   Significant imbalance with walking? Endorsed , especially in the morning I wobble for 6-8 months   Falls?   3 x w the last year last 4 mo ago" coming out of the  front door and my foot touched the doormat  ".  No other falls during the last 2 weeks.                                 Any change in urine color, especially after exertion/physical activity? no  The patient denies  diplopia, ptosis, She complains of chronic  dysphagia with meats (GI following)  Denies  poor saliva control, dysarthria/dysphonia only when waking up , impaired mastication, facial weakness/droop. The patient denies  impaired sweating, heat or cold intolerance, excessive mucosal dryness, gastroparetic early satiety, denies constipation She has incontinence and uses pads  She has a history of low blood pressure   The patient has not  noticed any recent skin rashes nor does she  report any constitutional symptoms like  fever, night sweats, anorexia or unintentional weight loss.   ETOH use: no  Restrictive diet? no  Family history of neuropathy/myopathy/NM disease? no    Current medications being tried for the patient's symptoms include Cymbalta and Lyrica   Prior medications that have been tried: gabapentin but was getting too forgetful so dc'd 6 months ago   Pertinent labs 07/31/2022: Iron 45, TIBC 448, ferritin 17.4, iron saturation 10 (low), folate 23.9 (normal), CRP normal less than 1, vitamin B12 597 normal, SPEP essentially unremarkable except for serum IgM at 40, sed rate  53 (normal for age), A1c 5.7, ANA negative, IgA 239 normal, IgG  normal at 945, MAM 348 elevated, TSH 2.160 (05/2022)   MEDICATIONS:  Outpatient Encounter Medications as of 11/18/2022  Medication Sig   acetaminophen (TYLENOL) 500 MG tablet Take 1,000 mg by mouth every 6 (six) hours as needed for moderate pain, headache or fever.   albuterol (VENTOLIN HFA) 108 (90 Base) MCG/ACT inhaler TAKE 2 PUFFS BY MOUTH EVERY 6 HOURS AS NEEDED FOR WHEEZE OR SHORTNESS OF BREATH (Patient taking differently: Inhale 2 puffs into the lungs every 6 (six) hours as needed for wheezing or shortness of breath.)   amiodarone (PACERONE) 200 MG tablet Take 1 tablet (200 mg total) by mouth daily.   buPROPion (WELLBUTRIN XL) 300 MG 24 hr tablet TAKE 1 TABLET BY MOUTH EVERY DAY   Carboxymethylcellulose Sodium (REFRESH TEARS OP) Place 1 drop into both eyes 2 (two) times daily.   cetirizine (ZYRTEC) 10 MG tablet Take 10 mg by mouth daily as needed for allergies.   DULoxetine (CYMBALTA) 60 MG capsule TAKE 1 CAPSULE BY MOUTH EVERY DAY (Patient taking differently: Take 60 mg by mouth daily.)   ELIQUIS 5 MG TABS tablet TAKE 1 TABLET BY MOUTH TWICE A DAY (Patient taking differently: Take 5 mg by mouth 2 (two) times daily.)   FeFum-FePoly-FA-B Cmp-C-Biot (INTEGRA PLUS) CAPS Take 1 capsule by mouth every morning.   KLOR-CON M20 20 MEQ tablet TAKE 1 TABLET BY MOUTH EVERY DAY    losartan (COZAAR) 50 MG tablet TAKE 1 TABLET BY MOUTH EVERY DAY   metoprolol tartrate (LOPRESSOR) 25 MG tablet Take 0.5 tablets (12.5 mg total) by mouth 2 (two) times daily.   Multiple Vitamins-Minerals (ONE-A-DAY WOMENS 50+) TABS Take 1 tablet by mouth daily.   pantoprazole (PROTONIX) 40 MG tablet Take 1 tablet (40 mg total) by mouth daily.   pregabalin (LYRICA) 50 MG capsule Take 1 capsule (50 mg total) by mouth daily.   rosuvastatin (CRESTOR) 20 MG tablet Take 1 tablet (20 mg total) by mouth daily at 6 PM.   sodium chloride (OCEAN) 0.65 % SOLN nasal spray Place 1 spray into both nostrils as needed for congestion.   clonazePAM (KLONOPIN) 0.5 MG tablet Take 0.5 tablets (0.25 mg total) by mouth 2 (two) times daily as needed for anxiety.   No facility-administered encounter medications on file as of 11/18/2022.    PAST MEDICAL HISTORY: Past Medical History:  Diagnosis Date   A-fib ([place])    Anemia    Anxiety    Bipolar disorder ([place])    Blood transfusion 1991   autologous pts own blood given    CAD (coronary artery disease)    Cataract    Chest pain    "@ rest, lying down, w/exertion"   CHF (congestive heart failure) ([place]) 07/2020   Chronic back pain    "mostly lower back but I do have upper back pain regularly" (04/06/2018)   COPD (chronic obstructive pulmonary disease) ([facility])    Depression    Esophageal stricture    Fibromyalgia    GERD (gastroesophageal reflux disease)    Heart murmur    "slight" (04/06/2018)   Hiatal hernia    Hyperlipidemia    Patient denies   Hypertension    Internal hemorrhoids    Lumbago    Mitral regurgitation    Osteoarthritis    Osteoporosis    Pneumonia ~ 01/2018   PONV (postoperative nausea and vomiting)    severe ponv, "in the past" (04/06/2018)   Rectal bleeding    Rheumatoid arthritis ([place])    Spondylosis  TIA (transient ischemic attack) 2013   Urinary incontinence    wears depends    PAST SURGICAL HISTORY: Past Surgical History:   Procedure Laterality Date   ABDOMINAL HYSTERECTOMY  1982   BACK SURGERY     BALLOON DILATION N/A 09/21/2018   Procedure: BALLOON DILATION;  Surgeon: Irene Shipper, MD;  Location: [facility];  Service: Endoscopy;  Laterality: N/A;   BALLOON DILATION N/A 08/12/2019   Procedure: BALLOON DILATION;  Surgeon: Jackquline Denmark, MD;  Location: [facility] ENDOSCOPY;  Service: Endoscopy;  Laterality: N/A;   BIOPSY  08/12/2019   Procedure: BIOPSY;  Surgeon: Jackquline Denmark, MD;  Location: [place];  Service: Endoscopy;;   BLADDER SUSPENSION  1980's   BOTOX INJECTION N/A 09/21/2018   Procedure: BOTOX INJECTION;  Surgeon: Irene Shipper, MD;  Location: [facility]OS[facility];  Service: Endoscopy;  Laterality: N/A;   BOTOX INJECTION N/A 08/12/2019   Procedure: BOTOX INJECTION;  Surgeon: Jackquline Denmark, MD;  Location[facility] ENDOSCOPY;  Service: Endoscopy;  Laterality: N/A;   BOTOX INJECTION N/A 09/27/2022   Procedure: BOTOX INJECTION;  Surgeon: Doran Stabler, MD;  Location: [place];  Service: Gastroenterology;  Laterality: N/A;   CATARACT EXTRACTION W/ INTRAOCULAR LENS  IMPLANT, BILATERAL Bilateral 2010   DILATION AND CURETTAGE OF UTERUS  1961   ESOPHAGEAL MANOMETRY N/A 03/25/2018   Procedure: ESOPHAGEAL MANOMETRY (EM);  Surgeon: Mauri Pole, MD;  Location: [facility];  Service: Endoscopy;  Laterality: N/A;   ESOPHAGOGASTRODUODENOSCOPY (EGD) WITH PROPOFOL N/A 04/07/2018   Procedure: ESOPHAGOGASTRODUODENOSCOPY (EGD) WITH PROPOFOL;  Surgeon: Doran Stabler, MD;  Location: [place];  Service: Gastroenterology;  Laterality: N/A;   ESOPHAGOGASTRODUODENOSCOPY (EGD) WITH PROPOFOL N/A 09/21/2018   Procedure: ESOPHAGOGASTRODUODENOSCOPY (EGD) WITH PROPOFOL;  Surgeon: Irene Shipper, MD;  Location: [facility];  Service: Endoscopy;  Laterality: N/A;   ESOPHAGOGASTRODUODENOSCOPY (EGD) WITH PROPOFOL N/A 08/12/2019   Procedure: ESOPHAGOGASTRODUODENOSCOPY (EGD) WITH PROPOFOL;  Surgeon: Jackquline Denmark, MD;  Location: [facility]  ENDOSCOPY;  Service: Endoscopy;  Laterality: N/A;   ESOPHAGOGASTRODUODENOSCOPY (EGD) WITH PROPOFOL N/A 09/27/2022   Procedure: ESOPHAGOGASTRODUODENOSCOPY (EGD) WITH PROPOFOL;  Surgeon: Doran Stabler, MD;  Location: [place];  Service: Gastroenterology;  Laterality: N/A;   HERNIA REPAIR     HIATAL HERNIA REPAIR N/A 08/02/2016   Procedure: LAPAROSCOPIC REPAIR OF LARGE  HIATAL HERNIA;  Surgeon: Excell Seltzer, MD;  Location: [facility];  Service: General;  Laterality: N/A;   JOINT REPLACEMENT     LAPAROSCOPIC NISSEN FUNDOPLICATION N/A 04/01/4131   Procedure: LAPAROSCOPIC NISSEN FUNDOPLICATION;  Surgeon: Excell Seltzer, MD;  Location: [facility];  Service: General;  Laterality: N/A;   [place]; 1994; [id number]   "I've got 2 stainless steel rods; 6 screws; 2 ray cages"took bone from right hip to put in back   RIGHT/LEFT HEART CATH AND CORONARY ANGIOGRAPHY N/A 07/26/2019   Procedure: RIGHT/LEFT HEART CATH AND CORONARY ANGIOGRAPHY;  Surgeon: Belva Crome, MD;  Location: [place];  Service: Cardiovascular;  Laterality: N/A;   SVT ABLATION N/A 04/25/2021   Procedure: SVT ABLATION;  Surgeon: Constance Haw, MD;  Location: [place] [facility];  Service: Cardiovascular;  Laterality: N/A;   TEE WITHOUT CARDIOVERSION N/A 08/19/2019   Procedure: TRANSESOPHAGEAL ECHOCARDIOGRAM (TEE);  Surgeon: Lelon Perla, MD;  Location: [facility] ENDOSCOPY;  Service: Cardiovascular;  Laterality: N/A;   TONSILLECTOMY AND ADENOIDECTOMY  1945   TOTAL KNEE ARTHROPLASTY Right ~ [place]  ~ 1976    ALLERGIES: Allergies  Allergen Reactions   Tape Other (See Comments)    Band aides, adhesive tape Redness and pulls skin off   Tramadol Other (See Comments)    Pt has prolonged Qtc interval of 540- cannot give tramadol per pharmacy   Morphine Itching and Rash    Flushing    FAMILY HISTORY: Family History  Problem Relation Age of Onset   Kidney disease Mother    Hypertension  Mother    Heart disease Father 57       die of MI at age 37   Heart disease Brother    Heart attack Brother    Suicidality Son    Other Son        MVA   Colon cancer Neg Hx    Esophageal cancer Neg Hx    Pancreatic cancer Neg Hx    Rectal cancer Neg Hx    Stomach cancer Neg Hx     SOCIAL HISTORY: Social History   Tobacco Use   Smoking status: Never   Smokeless tobacco: Never  Vaping Use   Vaping Use: Never used  Substance Use Topics   Alcohol use: Never   Drug use: Never   Social History   Social History Narrative   Retired    Widowed   Right handed     OBJECTIVE: PHYSICAL EXAM: BP 119/70   Pulse 66   Resp 18   Ht 5\' 2"  (1.575 m)   Wt 151 lb (68.5 kg)   SpO2 97%   BMI 27.62 kg/m   General: General appearance: Awake and alert. No distress. Cooperative with exam.  Skin: No obvious rash or jaundice. HEENT: Atraumatic. Anicteric. Lungs: Non-labored breathing on room air  Heart: Regular Abdomen: Soft, non tender. Extremities: No edema. No obvious deformity.  Musculoskeletal: No obvious joint swelling. Psych: Affect appropriate.  Neurological: Mental Status: Alert. Speech fluent.  Cranial Nerves: CNII: Visual fields grossly intact. CNIII, IV, VI: PERRL. No nystagmus. EOMI. CN V: Facial sensation intact bilaterally to fine touch. clench strong. CN VII: Facial muscles symmetric and strong. No ptosis at rest or after sustained upgaze. CN VIII: Hearing grossly intact bilaterally. CN IX: No hypophonia. CN X: Palate elevates symmetrically. CN XI: Full strength shoulder shrug bilaterally. CN XII:No atrophy or fasciculations. No  dysarthria Motor: Tone is normal. No  fasciculations in B extremities. No atrophy. Postural intention tremor, mild     DTRs 1/4 B    Sensation: Pinprick: decreased on both feet Vibration: cannot feel on B great toes, normal rest of toes  Temperature: normal  Proprioception: normal  Coordination: Intact finger-to-  nose-finger bilaterally.  Posture somewhat stable Gait: Able to rise from chair with arms crossed needs to push chair . Normal, narrow-based gait, L knee severe arthritis . Unable to heel toe walk         The impression above as well as the plan as outlined below were extensively discussed with the patient All questions were answered to their satisfaction.    Total time spent reviewing records, interview, history/exam, documentation, and coordination of care on day of encounter:  26 min   Thank you for allowing me to participate in patient's care.  If I can answer any additional questions, I would be pleased to do so.  , PA-C    CC: Marlowe Kays, NP [address] [place]

## 2022-11-19 ENCOUNTER — Ambulatory Visit: Payer: Medicare Other | Admitting: Internal Medicine

## 2022-11-24 ENCOUNTER — Other Ambulatory Visit: Payer: Self-pay | Admitting: Adult Health

## 2022-11-24 ENCOUNTER — Other Ambulatory Visit: Payer: Self-pay | Admitting: Physical Medicine and Rehabilitation

## 2022-11-24 ENCOUNTER — Other Ambulatory Visit: Payer: Self-pay | Admitting: Physician Assistant

## 2022-11-24 DIAGNOSIS — D509 Iron deficiency anemia, unspecified: Secondary | ICD-10-CM

## 2022-11-24 DIAGNOSIS — F339 Major depressive disorder, recurrent, unspecified: Secondary | ICD-10-CM

## 2022-11-25 ENCOUNTER — Encounter: Payer: Self-pay | Admitting: Adult Health

## 2022-11-26 NOTE — Telephone Encounter (Signed)
Please advise 

## 2022-11-27 ENCOUNTER — Other Ambulatory Visit: Payer: Medicare Other

## 2022-11-27 ENCOUNTER — Ambulatory Visit: Payer: Medicare Other | Admitting: Internal Medicine

## 2022-12-03 ENCOUNTER — Other Ambulatory Visit: Payer: Self-pay | Admitting: Cardiology

## 2022-12-03 ENCOUNTER — Other Ambulatory Visit: Payer: Self-pay | Admitting: Cardiovascular Disease

## 2022-12-03 ENCOUNTER — Other Ambulatory Visit: Payer: Self-pay | Admitting: Adult Health

## 2022-12-03 DIAGNOSIS — F339 Major depressive disorder, recurrent, unspecified: Secondary | ICD-10-CM

## 2022-12-03 DIAGNOSIS — I480 Paroxysmal atrial fibrillation: Secondary | ICD-10-CM

## 2022-12-03 NOTE — Telephone Encounter (Signed)
Eliquis 5mg  refill request received. Patient is 87 years old, weight-68.5kg, Crea-0.93 on 10/22/2022, Diagnosis-Afib, and last seen by Dr. Curt Bears on 06/07/2022. Dose is appropriate based on dosing criteria. Will send in refill to requested pharmacy.

## 2022-12-04 ENCOUNTER — Other Ambulatory Visit: Payer: Self-pay | Admitting: Medical Oncology

## 2022-12-04 DIAGNOSIS — D509 Iron deficiency anemia, unspecified: Secondary | ICD-10-CM

## 2022-12-04 NOTE — Telephone Encounter (Signed)
Called pt to schedule an appointment. However, when I called pt stated that she wasn't doing too good. Pt stated that she was having SOB and her heart was racing. Pt stated that EMS is on the way. I advised pt that I will let Tommi Rumps know the update and will call her back to check on her soon. Pt verbalized understanding.

## 2022-12-05 ENCOUNTER — Telehealth: Payer: Self-pay | Admitting: Internal Medicine

## 2022-12-05 ENCOUNTER — Inpatient Hospital Stay: Payer: Medicare Other

## 2022-12-05 ENCOUNTER — Inpatient Hospital Stay: Payer: Medicare Other | Admitting: Internal Medicine

## 2022-12-05 NOTE — Telephone Encounter (Signed)
Called to check on pt status but she stated she never went to the ED. Pt stated she felt like she could "stick it out" at home. Pt still c/o SOB and slight cough arm tingling numbness and a fever. I advised pt that she needed to be seen. Pt stated that she has no one to help her to any appts. She also claimed that she is waiting on daughter in law to see if she could help her. Pt articulated that she would call back after asking someone if they could bring her. I also advised that she may need to call EMS to get her to the ED. Pt stated if no one can bring her she will call EMS. Pt declined EMS and stated she will wait until tomorrow to seek help. Pt stated she feels while sitting but SOB when walking. I advised pt again to seek help but she declined again.

## 2022-12-05 NOTE — Telephone Encounter (Signed)
Patient called to reschedule appointment due to illness. Rescheduled patient and forwarding to RN.

## 2022-12-05 NOTE — Telephone Encounter (Signed)
FYI

## 2022-12-07 ENCOUNTER — Observation Stay (HOSPITAL_COMMUNITY)
Admission: EM | Admit: 2022-12-07 | Discharge: 2022-12-09 | Disposition: A | Discharge status: Home / Self Care | Payer: Medicare Other | Attending: Internal Medicine | Admitting: Internal Medicine

## 2022-12-07 ENCOUNTER — Encounter (HOSPITAL_COMMUNITY): Payer: Self-pay

## 2022-12-07 ENCOUNTER — Emergency Department (HOSPITAL_COMMUNITY): Payer: Medicare Other

## 2022-12-07 ENCOUNTER — Other Ambulatory Visit: Payer: Self-pay

## 2022-12-07 DIAGNOSIS — Z7901 Long term (current) use of anticoagulants: Secondary | ICD-10-CM | POA: Diagnosis not present

## 2022-12-07 DIAGNOSIS — Z79899 Other long term (current) drug therapy: Secondary | ICD-10-CM | POA: Insufficient documentation

## 2022-12-07 DIAGNOSIS — Z96651 Presence of right artificial knee joint: Secondary | ICD-10-CM | POA: Diagnosis not present

## 2022-12-07 DIAGNOSIS — J449 Chronic obstructive pulmonary disease, unspecified: Secondary | ICD-10-CM | POA: Insufficient documentation

## 2022-12-07 DIAGNOSIS — R0789 Other chest pain: Secondary | ICD-10-CM | POA: Diagnosis not present

## 2022-12-07 DIAGNOSIS — I4891 Unspecified atrial fibrillation: Secondary | ICD-10-CM | POA: Insufficient documentation

## 2022-12-07 DIAGNOSIS — I10 Essential (primary) hypertension: Secondary | ICD-10-CM | POA: Diagnosis not present

## 2022-12-07 DIAGNOSIS — R079 Chest pain, unspecified: Secondary | ICD-10-CM | POA: Diagnosis not present

## 2022-12-07 DIAGNOSIS — R000 Tachycardia, unspecified: Secondary | ICD-10-CM | POA: Diagnosis not present

## 2022-12-07 DIAGNOSIS — Z8673 Personal history of transient ischemic attack (TIA), and cerebral infarction without residual deficits: Secondary | ICD-10-CM | POA: Diagnosis not present

## 2022-12-07 DIAGNOSIS — I5032 Chronic diastolic (congestive) heart failure: Secondary | ICD-10-CM | POA: Diagnosis not present

## 2022-12-07 DIAGNOSIS — E039 Hypothyroidism, unspecified: Secondary | ICD-10-CM | POA: Diagnosis not present

## 2022-12-07 DIAGNOSIS — R0602 Shortness of breath: Secondary | ICD-10-CM | POA: Diagnosis not present

## 2022-12-07 DIAGNOSIS — R072 Precordial pain: Principal | ICD-10-CM

## 2022-12-07 DIAGNOSIS — R778 Other specified abnormalities of plasma proteins: Secondary | ICD-10-CM | POA: Diagnosis not present

## 2022-12-07 DIAGNOSIS — R030 Elevated blood-pressure reading, without diagnosis of hypertension: Secondary | ICD-10-CM | POA: Insufficient documentation

## 2022-12-07 DIAGNOSIS — Z1152 Encounter for screening for COVID-19: Secondary | ICD-10-CM | POA: Insufficient documentation

## 2022-12-07 DIAGNOSIS — R7989 Other specified abnormal findings of blood chemistry: Secondary | ICD-10-CM | POA: Diagnosis not present

## 2022-12-07 DIAGNOSIS — I110 Hypertensive heart disease with heart failure: Secondary | ICD-10-CM | POA: Diagnosis not present

## 2022-12-07 DIAGNOSIS — I700 Atherosclerosis of aorta: Secondary | ICD-10-CM | POA: Diagnosis not present

## 2022-12-07 DIAGNOSIS — I2510 Atherosclerotic heart disease of native coronary artery without angina pectoris: Secondary | ICD-10-CM | POA: Insufficient documentation

## 2022-12-07 DIAGNOSIS — K222 Esophageal obstruction: Secondary | ICD-10-CM

## 2022-12-07 DIAGNOSIS — R0902 Hypoxemia: Secondary | ICD-10-CM | POA: Diagnosis not present

## 2022-12-07 LAB — COMPREHENSIVE METABOLIC PANEL
ALT: 16 U/L (ref 0–44)
AST: 21 U/L (ref 15–41)
Albumin: 3.5 g/dL (ref 3.5–5.0)
Alkaline Phosphatase: 60 U/L (ref 38–126)
Anion gap: 12 (ref 5–15)
BUN: 13 mg/dL (ref 8–23)
CO2: 24 mmol/L (ref 22–32)
Calcium: 9.4 mg/dL (ref 8.9–10.3)
Chloride: 100 mmol/L (ref 98–111)
Creatinine, Ser: 0.97 mg/dL (ref 0.44–1.00)
GFR, Estimated: 58 mL/min — ABNORMAL LOW (ref >60)
Glucose, Bld: 91 mg/dL (ref 70–99)
Potassium: 3.7 mmol/L (ref 3.5–5.1)
Sodium: 136 mmol/L (ref 135–145)
Total Bilirubin: 0.6 mg/dL (ref 0.3–1.2)
Total Protein: 6.2 g/dL — ABNORMAL LOW (ref 6.5–8.1)

## 2022-12-07 LAB — CBC
HCT: 36.4 % (ref 36.0–46.0)
Hemoglobin: 12 g/dL (ref 12.0–15.0)
MCH: 30.8 pg (ref 26.0–34.0)
MCHC: 33 g/dL (ref 30.0–36.0)
MCV: 93.3 fL (ref 80.0–100.0)
Platelets: 284 10*3/uL (ref 150–400)
RBC: 3.9 MIL/uL (ref 3.87–5.11)
RDW: 14.3 % (ref 11.5–15.5)
WBC: 10.9 10*3/uL — ABNORMAL HIGH (ref 4.0–10.5)
nRBC: 0 % (ref 0.0–0.2)

## 2022-12-07 LAB — TROPONIN I (HIGH SENSITIVITY)
Troponin I (High Sensitivity): 15 ng/L (ref <18)
Troponin I (High Sensitivity): 19 ng/L — ABNORMAL HIGH (ref <18)
Troponin I (High Sensitivity): 23 ng/L — ABNORMAL HIGH (ref <18)

## 2022-12-07 LAB — RESP PANEL BY RT-PCR (RSV, FLU A&B, COVID)  RVPGX2
Influenza A by PCR: NEGATIVE
Influenza B by PCR: NEGATIVE
Resp Syncytial Virus by PCR: NEGATIVE
SARS Coronavirus 2 by RT PCR: NEGATIVE

## 2022-12-07 LAB — D-DIMER, QUANTITATIVE: D-Dimer, Quant: <0.27 ug/mL-FEU (ref 0.00–0.50)

## 2022-12-07 LAB — BRAIN NATRIURETIC PEPTIDE: B Natriuretic Peptide: 217.6 pg/mL — ABNORMAL HIGH (ref 0.0–100.0)

## 2022-12-07 MED ORDER — FAMOTIDINE 20 MG PO TABS
20.0000 mg | ORAL_TABLET | Freq: Once | ORAL | Status: AC
  Administered 2022-12-07: 20 mg via ORAL
  Filled 2022-12-07: qty 1

## 2022-12-07 MED ORDER — ALUM & MAG HYDROXIDE-SIMETH 200-200-20 MG/5ML PO SUSP
30.0000 mL | Freq: Once | ORAL | Status: AC
  Administered 2022-12-07: 30 mL via ORAL
  Filled 2022-12-07: qty 30

## 2022-12-07 MED ORDER — ACETAMINOPHEN 500 MG PO TABS
1000.0000 mg | ORAL_TABLET | Freq: Once | ORAL | Status: AC
  Administered 2022-12-07: 1000 mg via ORAL
  Filled 2022-12-07: qty 2

## 2022-12-07 NOTE — ED Provider Notes (Signed)
[place] EMERGENCY DEPARTMENT AT [facility] Provider Note   CSN: [id number] Arrival date & time: 12/07/22  1516     History  Chief Complaint  Patient presents with   Chest Pain    Weakness, shortness of breath    Julie Rice is a 87 y.o. female.  Pt c/p feeling chest discomfort and sob in past couple days. Symptoms at rest, mild-mod, persistent, non radiating. No pleuritic pain. Denies cough or sore throat. No fever or chills. Compliant w home meds. Denies new leg pain or swelling. No orthopnea/pnd.   The history is provided by the patient.  Chest Pain Associated symptoms: palpitations and shortness of breath   Associated symptoms: no abdominal pain, no back pain, no cough, no fever, no headache and no vomiting        Home Medications Prior to Admission medications   Medication Sig Start Date End Date Taking? Authorizing Provider  acetaminophen (TYLENOL) 500 MG tablet Take 1,000 mg by mouth every 6 (six) hours as needed for moderate pain, headache or fever.    [provider]  albuterol (VENTOLIN HFA) 108 (90 Base) MCG/ACT inhaler TAKE 2 PUFFS BY MOUTH EVERY 6 HOURS AS NEEDED FOR WHEEZE OR SHORTNESS OF BREATH Patient taking differently: Inhale 2 puffs into the lungs every 6 (six) hours as needed for wheezing or shortness of breath. 08/06/22   Nafziger, [name], NP  amiodarone (PACERONE) 200 MG tablet Take 1 tablet (200 mg total) by mouth daily. 09/29/22   Leatha Gilding, MD  buPROPion (WELLBUTRIN XL) 300 MG 24 hr tablet TAKE 1 TABLET BY MOUTH EVERY DAY 12/03/22   Nafziger, [name], NP  Carboxymethylcellulose Sodium (REFRESH TEARS OP) Place 1 drop into both eyes 2 (two) times daily.    [provider]  cetirizine (ZYRTEC) 10 MG tablet Take 10 mg by mouth daily as needed for allergies. 02/20/21   [provider]  clonazePAM (KLONOPIN) 0.5 MG tablet Take 0.5 tablets (0.25 mg total) by mouth 2 (two) times daily as needed for anxiety. 09/29/22  10/29/22  Leatha Gilding, MD  DULoxetine (CYMBALTA) 60 MG capsule TAKE 1 CAPSULE BY MOUTH EVERY DAY Patient taking differently: Take 60 mg by mouth daily. 11/30/21   Lovorn, Aundra Millet, MD  ELIQUIS 5 MG TABS tablet TAKE 1 TABLET BY MOUTH TWICE A DAY 12/03/22   Camnitz, Andree Coss, MD  FeFum-FePoly-FA-B Cmp-C-Biot (FOLIVANE-PLUS) CAPS TAKE 1 CAPSULE BY MOUTH EVERY DAY IN THE MORNING 11/25/22   Heilingoetter, Cassandra L, PA-C  KLOR-CON M20 20 MEQ tablet TAKE 1 TABLET BY MOUTH EVERY DAY 08/06/22   Tereso Newcomer T, PA-C  losartan (COZAAR) 50 MG tablet TAKE 1 TABLET BY MOUTH EVERY DAY 07/18/22   Camnitz, Andree Coss, MD  metoprolol tartrate (LOPRESSOR) 25 MG tablet Take 0.5 tablets (12.5 mg total) by mouth 2 (two) times daily. 09/29/22   Leatha Gilding, MD  Multiple Vitamins-Minerals (ONE-A-DAY WOMENS 50+) TABS Take 1 tablet by mouth daily.    [provider]  pantoprazole (PROTONIX) 40 MG tablet Take 1 tablet (40 mg total) by mouth daily. 09/29/22   Leatha Gilding, MD  pregabalin (LYRICA) 50 MG capsule Take 1 capsule (50 mg total) by mouth daily. 09/29/22   Leatha Gilding, MD  rosuvastatin (CRESTOR) 20 MG tablet Take 1 tablet (20 mg total) by mouth daily at 6 PM. 09/29/22   Gherghe, Daylene Katayama, MD  sodium chloride (OCEAN) 0.65 % SOLN nasal spray Place 1 spray into both nostrils as  needed for congestion.    [provider]      Allergies    Tape, Tramadol, and Morphine    Review of Systems   Review of Systems  Constitutional:  Negative for chills and fever.  HENT:  Negative for sore throat.   Eyes:  Negative for redness.  Respiratory:  Positive for shortness of breath. Negative for cough.   Cardiovascular:  Positive for chest pain and palpitations. Negative for leg swelling.  Gastrointestinal:  Negative for abdominal pain, blood in stool, diarrhea and vomiting.  Genitourinary:  Negative for dysuria and flank pain.  Musculoskeletal:  Negative for back pain and neck pain.   Skin:  Negative for rash.  Neurological:  Negative for headaches.  Hematological:  Does not bruise/bleed easily.  Psychiatric/Behavioral:  Negative for confusion.     Physical Exam Updated Vital Signs BP (!) 155/89   Pulse 97   Temp 97.7 F (36.5 C) (Oral)   Resp (!) 21   Ht 1.575 m (5\' 2" )   Wt 67.1 kg   SpO2 100%   BMI 27.07 kg/m  Physical Exam Vitals and nursing note reviewed.  Constitutional:      Appearance: Normal appearance. She is well-developed.  HENT:     Head: Atraumatic.     Nose: Nose normal.     Mouth/Throat:     Mouth: Mucous membranes are moist.  Eyes:     General: No scleral icterus.    Conjunctiva/sclera: Conjunctivae normal.  Neck:     Trachea: No tracheal deviation.  Cardiovascular:     Rate and Rhythm: Normal rate and regular rhythm.     Pulses: Normal pulses.     Heart sounds: Normal heart sounds. No murmur heard.    No friction rub. No gallop.  Pulmonary:     Effort: Pulmonary effort is normal. No respiratory distress.     Breath sounds: Normal breath sounds.  Abdominal:     General: Bowel sounds are normal. There is no distension.     Palpations: Abdomen is soft.     Tenderness: There is no abdominal tenderness.  Genitourinary:    Comments: No cva tenderness.  Musculoskeletal:        General: No swelling or tenderness.     Cervical back: Normal range of motion and neck supple. No rigidity. No muscular tenderness.     Right lower leg: No edema.     Left lower leg: No edema.  Skin:    General: Skin is warm and dry.     Findings: No rash.  Neurological:     Mental Status: She is alert.     Comments: Alert, speech normal.   Psychiatric:        Mood and Affect: Mood normal.     ED Results / Procedures / Treatments   Labs (all labs ordered are listed, but only abnormal results are displayed) Results for orders placed or performed during the hospital encounter of 12/07/22  Resp panel by RT-PCR (RSV, Flu A&B, Covid) Anterior Nasal  Swab   Specimen: Anterior Nasal Swab  Result Value Ref Range   SARS Coronavirus 2 by RT PCR NEGATIVE NEGATIVE   Influenza A by PCR NEGATIVE NEGATIVE   Influenza B by PCR NEGATIVE NEGATIVE   Resp Syncytial Virus by PCR NEGATIVE NEGATIVE  CBC  Result Value Ref Range   WBC 10.9 (H) 4.0 - 10.5 K/uL   RBC 3.90 3.87 - 5.11 MIL/uL   Hemoglobin 12.0 12.0 - 15.0 g/dL  HCT 36.4 36.0 - 46.0 %   MCV 93.3 80.0 - 100.0 fL   MCH 30.8 26.0 - 34.0 pg   MCHC 33.0 30.0 - 36.0 g/dL   RDW 14.3 11.5 - 15.5 %   Platelets 284 150 - 400 K/uL   nRBC 0.0 0.0 - 0.2 %  Comprehensive metabolic panel  Result Value Ref Range   Sodium 136 135 - 145 mmol/L   Potassium 3.7 3.5 - 5.1 mmol/L   Chloride 100 98 - 111 mmol/L   CO2 24 22 - 32 mmol/L   Glucose, Bld 91 70 - 99 mg/dL   BUN 13 8 - 23 mg/dL   Creatinine, Ser 0.97 0.44 - 1.00 mg/dL   Calcium 9.4 8.9 - 10.3 mg/dL   Total Protein 6.2 (L) 6.5 - 8.1 g/dL   Albumin 3.5 3.5 - 5.0 g/dL   AST 21 15 - 41 U/L   ALT 16 0 - 44 U/L   Alkaline Phosphatase 60 38 - 126 U/L   Total Bilirubin 0.6 0.3 - 1.2 mg/dL   GFR, Estimated 58 (L) >60 mL/min   Anion gap 12 5 - 15  Brain natriuretic peptide  Result Value Ref Range   B Natriuretic Peptide 217.6 (H) 0.0 - 100.0 pg/mL  D-dimer, quantitative  Result Value Ref Range   D-Dimer, Quant <0.27 0.00 - 0.50 ug/mL-FEU  Troponin I (High Sensitivity)  Result Value Ref Range   Troponin I (High Sensitivity) 15 <18 ng/L  Troponin I (High Sensitivity)  Result Value Ref Range   Troponin I (High Sensitivity) 19 (H) <18 ng/L  Troponin I (High Sensitivity)  Result Value Ref Range   Troponin I (High Sensitivity) 23 (H) <18 ng/L   *Note: Due to a large number of results and/or encounters for the requested time period, some results have not been displayed. A complete set of results can be found in Results Review.     EKG EKG Interpretation  Date/Time:  Saturday December 07 2022 15:30:03 EST Ventricular Rate:  108 PR  Interval:  150 QRS Duration: 97 QT Interval:  349 QTC Calculation: 468 R Axis:   -6 Text Interpretation: Sinus tachycardia Nonspecific ST abnormality Confirmed by Lajean Saver [phone]) on 12/07/2022 3:53:26 PM  Radiology DG Chest Port 1 View  Result Date: 12/07/2022 CLINICAL DATA:  Shortness of breath, chest pain EXAM: PORTABLE CHEST 1 VIEW COMPARISON:  09/22/2022 FINDINGS: Atherosclerotic calcification of the aortic arch. Levoconvex lower thoracic scoliosis. Currently the heart size is within normal limits. The lungs appear clear. No blunting of the costophrenic angles (with noted exclusion of the tip of the left costophrenic angle from imaging). IMPRESSION: 1. No active cardiopulmonary disease is radiographically apparent. 2. Levoconvex lower thoracic scoliosis. 3.  Aortic Atherosclerosis (ICD10-I70.0). Electronically Signed   By: Van Clines M.D.   On: 12/07/2022 17:00    Procedures Procedures    Medications Ordered in ED Medications  alum & mag hydroxide-simeth (MAALOX/MYLANTA) 200-200-20 MG/5ML suspension 30 mL (30 mLs Oral Given 12/07/22 1628)  famotidine (PEPCID) tablet 20 mg (20 mg Oral Given 12/07/22 1629)  acetaminophen (TYLENOL) tablet 1,000 mg (1,000 mg Oral Given 12/07/22 1628)    ED Course/ Medical Decision Making/ A&P                             Medical Decision Making Problems Addressed: Elevated blood pressure reading: acute illness or injury Elevated troponin: acute illness or injury with systemic symptoms that poses  a threat to life or bodily functions Precordial chest pain: acute illness or injury with systemic symptoms that poses a threat to life or bodily functions  Amount and/or Complexity of Data Reviewed External Data Reviewed: notes. Labs: ordered. Decision-making details documented in ED Course. Radiology: ordered and independent interpretation performed. Decision-making details documented in ED Course. ECG/medicine tests: ordered and independent  interpretation performed. Decision-making details documented in ED Course.  Risk OTC drugs. Decision regarding hospitalization.   Iv ns. Continuous pulse ox and cardiac monitoring. Labs ordered/sent. Imaging ordered.   Differential diagnosis includes acs, afib, covid, flu, chf, etc . Dispo decision including potential need for admission considered - will get labs and imaging and reassess.   Reviewed nursing notes and prior charts for additional history. External reports reviewed.   Cardiac monitor: sinus rhythm, rate 98.  Labs reviewed/interpreted by me - initial trop meds.   Meds given for symptom relief.   Xrays reviewed/interpreted by me - no pna.   Recheck, discomfort improved, breathing comfortably.   Additional labs reviewed/interpreted by me - delta trop mildly increased.   Trop has trended mildly upwards - in setting of chest pain/sob, will plan for admission.  Hospitalists consulted for admission.  On recheck, chest pain currently resolved.                Final Clinical Impression(s) / ED Diagnoses Final diagnoses:  Precordial chest pain  Elevated troponin  Elevated blood pressure reading    Rx / DC Orders ED Discharge Orders     None         Lajean Saver, MD 12/07/22 2317

## 2022-12-07 NOTE — ED Notes (Signed)
Received shift report, assumed care of patient at this time  

## 2022-12-07 NOTE — H&P (Signed)
History and Physical    Julie Rice WTU:[id number] DOB: Oct 30, 1936 DOA: 12/07/2022  PCP: Shirline Frees, NP  Patient coming from: home I have personally briefly reviewed patient's old medical records in [facility]  Chief Complaint: chest pain   HPI: Julie Rice is a 87 y.o. female with medical history significant of  hypertension, rheumatoid arthritis, chronic diastolic CHF, non obstructive CAD COPD, chronic respiratory failure on 2.5 L at night, esophageal strictures/GERD, hypothyroidism, chronic back pain , hyperlipidemia,CVA(dx 09/27/22), SVT/PAF on Eliquis. Patient also has interim history of hospitalization 1 mo ago for NSTEMI for which she was discharged to rehab and now home x 2 weeks. Per patient who presents from home with complaint of intermittent central chest pain associated with sob , x 1 week with progression over the last 1-2days. Patient notes no associated diaphoresis, presyncope ,or palpitations. She does episode of nausea and emesis.  She also noted that pain is made worse with walking and relieved with rest. She notes that check pain also improved with maalox.   ED Course:  Afeb,  Temp 150/78, pulse 111, rr 18 , sat 100% on 2L  EKG: sinus tachycardia , nonspecific ST abnormalities  Wbc 10.9, hgb 12, plt 284  Na 136, K 3.7,  cr 0.97,  CE 15 Bnp 217 around baseline D-dimer neg  CE 619 400 0547 Respiratory panel neg Tx tylenol, maalox, pepcid   Review of Systems: As per HPI otherwise 10 point review of systems negative.   Past Medical History:  Diagnosis Date   A-fib (HCC)    Anemia    Anxiety    Bipolar disorder (HCC)    Blood transfusion 1991   autologous pts own blood given    CAD (coronary artery disease)    Cataract    Chest pain    "@ rest, lying down, w/exertion"   CHF (congestive heart failure) (HCC) 07/2020   Chronic back pain    "mostly lower back but I do have upper back pain regularly" (04/06/2018)   COPD (chronic obstructive  pulmonary disease) (HCC)    Depression    Esophageal stricture    Fibromyalgia    GERD (gastroesophageal reflux disease)    Heart murmur    "slight" (04/06/2018)   Hiatal hernia    Hyperlipidemia    Patient denies   Hypertension    Internal hemorrhoids    Lumbago    Mitral regurgitation    Osteoarthritis    Osteoporosis    Pneumonia ~ 01/2018   PONV (postoperative nausea and vomiting)    severe ponv, "in the past" (04/06/2018)   Rectal bleeding    Rheumatoid arthritis (HCC)    Spondylosis    TIA (transient ischemic attack) 2013   Urinary incontinence    wears depends    Past Surgical History:  Procedure Laterality Date   ABDOMINAL HYSTERECTOMY  1982   BACK SURGERY     BALLOON DILATION N/A 09/21/2018   Procedure: BALLOON DILATION;  Surgeon: Hilarie Fredrickson, MD;  Location: [facility];  Service: Endoscopy;  Laterality: N/A;   BALLOON DILATION N/A 08/12/2019   Procedure: BALLOON DILATION;  Surgeon: Lynann Bologna, MD;  Location: [facility] ENDOSCOPY;  Service: Endoscopy;  Laterality: N/A;   BIOPSY  08/12/2019   Procedure: BIOPSY;  Surgeon: Lynann Bologna, MD;  Location: [facility] ENDOSCOPY;  Service: Endoscopy;;   BLADDER SUSPENSION  1980's   BOTOX INJECTION N/A 09/21/2018   Procedure: BOTOX INJECTION;  Surgeon: Hilarie Fredrickson, MD;  Location: [facility];  Service:  Endoscopy;  Laterality: N/A;   BOTOX INJECTION N/A 08/12/2019   Procedure: BOTOX INJECTION;  Surgeon: Jackquline Denmark, MD;  Location: [facility] ENDOSCOPY;  Service: Endoscopy;  Laterality: N/A;   BOTOX INJECTION N/A 09/27/2022   Procedure: BOTOX INJECTION;  Surgeon: Doran Stabler, MD;  Location: [place];  Service: Gastroenterology;  Laterality: N/A;   CATARACT EXTRACTION W/ INTRAOCULAR LENS  IMPLANT, BILATERAL Bilateral 2010   DILATION AND CURETTAGE OF UTERUS  1961   ESOPHAGEAL MANOMETRY N/A 03/25/2018   Procedure: ESOPHAGEAL MANOMETRY (EM);  Surgeon: Mauri Pole, MD;  Location: [facility];  Service: Endoscopy;  Laterality:  N/A;   ESOPHAGOGASTRODUODENOSCOPY (EGD) WITH PROPOFOL N/A 04/07/2018   Procedure: ESOPHAGOGASTRODUODENOSCOPY (EGD) WITH PROPOFOL;  Surgeon: Doran Stabler, MD;  Location: [place];  Service: Gastroenterology;  Laterality: N/A;   ESOPHAGOGASTRODUODENOSCOPY (EGD) WITH PROPOFOL N/A 09/21/2018   Procedure: ESOPHAGOGASTRODUODENOSCOPY (EGD) WITH PROPOFOL;  Surgeon: Irene Shipper, MD;  Location: [facility];  Service: Endoscopy;  Laterality: N/A;   ESOPHAGOGASTRODUODENOSCOPY (EGD) WITH PROPOFOL N/A 08/12/2019   Procedure: ESOPHAGOGASTRODUODENOSCOPY (EGD) WITH PROPOFOL;  Surgeon: Jackquline Denmark, MD;  Location[facility] ENDOSCOPY;  Service: Endoscopy;  Laterality: N/A;   ESOPHAGOGASTRODUODENOSCOPY (EGD) WITH PROPOFOL N/A 09/27/2022   Procedure: ESOPHAGOGASTRODUODENOSCOPY (EGD) WITH PROPOFOL;  Surgeon: Doran Stabler, MD;  Location: [place];  Service: Gastroenterology;  Laterality: N/A;   HERNIA REPAIR     HIATAL HERNIA REPAIR N/A 08/02/2016   Procedure: LAPAROSCOPIC REPAIR OF LARGE  HIATAL HERNIA;  Surgeon: Excell Seltzer, MD;  Location: [facility];  Service: General;  Laterality: N/A;   JOINT REPLACEMENT     LAPAROSCOPIC NISSEN FUNDOPLICATION N/A 2/35/3614   Procedure: LAPAROSCOPIC NISSEN FUNDOPLICATION;  Surgeon: Excell Seltzer, MD;  Location: [facility];  Service: General;  Laterality: N/A;   [place]; 1994; [id number]   "I've got 2 stainless steel rods; 6 screws; 2 ray cages"took bone from right hip to put in back   RIGHT/LEFT HEART CATH AND CORONARY ANGIOGRAPHY N/A 07/26/2019   Procedure: RIGHT/LEFT HEART CATH AND CORONARY ANGIOGRAPHY;  Surgeon: Belva Crome, MD;  Location: [place] CV [facility];  Service: Cardiovascular;  Laterality: N/A;   SVT ABLATION N/A 04/25/2021   Procedure: SVT ABLATION;  Surgeon: Constance Haw, MD;  Location: [place] [facility];  Service: Cardiovascular;  Laterality: N/A;   TEE WITHOUT CARDIOVERSION N/A 08/19/2019   Procedure: TRANSESOPHAGEAL  ECHOCARDIOGRAM (TEE);  Surgeon: Lelon Perla, MD;  Location: [facility] ENDOSCOPY;  Service: Cardiovascular;  Laterality: N/A;   [place] Right ~ [place]  ~ 1976     reports that she has never smoked. She has never used smokeless tobacco. She reports that she does not drink alcohol and does not use drugs.  Allergies  Allergen Reactions   Tape Other (See Comments)    Band aides, adhesive tape Redness and pulls skin off   Tramadol Other (See Comments)    Pt has prolonged Qtc interval of 540- cannot give tramadol per pharmacy   Morphine Itching and Rash    Flushing    Family History  Problem Relation Age of Onset   Kidney disease Mother    Hypertension Mother    Heart disease Father 32       die of MI at age 22   Heart disease Brother    Heart attack Brother    Suicidality Son    Other Son  MVA   Colon cancer Neg Hx    Esophageal cancer Neg Hx    Pancreatic cancer Neg Hx    Rectal cancer Neg Hx    Stomach cancer Neg Hx     Prior to Admission medications   Medication Sig Start Date End Date Taking? Authorizing Provider  acetaminophen (TYLENOL) 500 MG tablet Take 1,000 mg by mouth every 6 (six) hours as needed for moderate pain, headache or fever.   Yes [provider]  albuterol (VENTOLIN HFA) 108 (90 Base) MCG/ACT inhaler TAKE 2 PUFFS BY MOUTH EVERY 6 HOURS AS NEEDED FOR WHEEZE OR SHORTNESS OF BREATH Patient taking differently: Inhale 2 puffs into the lungs every 6 (six) hours as needed for wheezing or shortness of breath. 08/06/22  Yes Nafziger, Tommi Rumps, NP  amiodarone (PACERONE) 200 MG tablet Take 1 tablet (200 mg total) by mouth daily. 09/29/22  Yes Gherghe, Vella Redhead, MD  buPROPion (WELLBUTRIN XL) 300 MG 24 hr tablet TAKE 1 TABLET BY MOUTH EVERY DAY 12/03/22  Yes Nafziger, Tommi Rumps, NP  Carboxymethylcellulose Sodium (REFRESH TEARS OP) Place 1 drop into both eyes 2 (two) times daily.   Yes [provider]  cetirizine (ZYRTEC) 10 MG tablet Take 10 mg by mouth daily as needed for allergies. 02/20/21  Yes [provider]  clonazePAM (KLONOPIN) 0.5 MG tablet Take 0.5 tablets (0.25 mg total) by mouth 2 (two) times daily as needed for anxiety. 09/29/22 12/08/23 Yes Gherghe, Vella Redhead, MD  DULoxetine (CYMBALTA) 60 MG capsule TAKE 1 CAPSULE BY MOUTH EVERY DAY Patient taking differently: Take 60 mg by mouth daily. 11/30/21  Yes Lovorn, Megan, MD  ELIQUIS 5 MG TABS tablet TAKE 1 TABLET BY MOUTH TWICE A DAY Patient taking differently: Take 5 mg by mouth 2 (two) times daily. 12/03/22  Yes Camnitz, Ocie Doyne, MD  FeFum-FePoly-FA-B Cmp-C-Biot (FOLIVANE-PLUS) CAPS TAKE 1 CAPSULE BY MOUTH EVERY DAY IN THE MORNING Patient taking differently: Take 1 capsule by mouth daily. 11/25/22  Yes Heilingoetter, Cassandra L, PA-C  KLOR-CON M20 20 MEQ tablet TAKE 1 TABLET BY MOUTH EVERY DAY 08/06/22  Yes Weaver, Scott T, PA-C  losartan (COZAAR) 50 MG tablet TAKE 1 TABLET BY MOUTH EVERY DAY 07/18/22  Yes Camnitz, Ocie Doyne, MD  metoprolol tartrate (LOPRESSOR) 25 MG tablet Take 0.5 tablets (12.5 mg total) by mouth 2 (two) times daily. 09/29/22  Yes Gherghe, Vella Redhead, MD  Multiple Vitamins-Minerals (ONE-A-DAY WOMENS 50+) TABS Take 1 tablet by mouth daily.   Yes [provider]  omeprazole (PRILOSEC) 20 MG capsule Take 20 mg by mouth daily as needed (for heartburn). 12/03/22  Yes [provider]  rosuvastatin (CRESTOR) 20 MG tablet Take 1 tablet (20 mg total) by mouth daily at 6 PM. 09/29/22  Yes Gherghe, Vella Redhead, MD  sodium chloride (OCEAN) 0.65 % SOLN nasal spray Place 1 spray into both nostrils as needed for congestion.   Yes [provider]    Physical Exam: Vitals:   12/07/22 2030 12/07/22 2100 12/07/22 2130 12/07/22 2329  BP: (!) 150/77 (!) 150/78 (!) 155/89 139/84  Pulse: (!) 103 94 97 100  Resp: 12 19 (!) 21 15  Temp:      TempSrc:      SpO2: 100% 99% 100% 100%  Weight:       Height:        Constitutional: NAD, calm, comfortable Vitals:   12/07/22 2030 12/07/22 2100 12/07/22 2130 12/07/22 2329  BP: (!) 150/77 (!) 150/78 (!) 155/89 139/84  Pulse: (!) 103 94 97 100  Resp: 12 19 (!) 21 15  Temp:      TempSrc:      SpO2: 100% 99% 100% 100%  Weight:      Height:       Eyes: PERRL, lids and conjunctivae normal ENMT: Mucous membranes are moist. Posterior pharynx clear of any exudate or lesions.Normal dentition.  Neck: normal, supple, no masses, no thyromegaly Respiratory: clear to auscultation bilaterally, no wheezing, no crackles. Normal respiratory effort. No accessory muscle use.  Cardiovascular: Regular rate and rhythm, no murmurs / rubs / gallops. No extremity edema. 2+ pedal pulses.  Abdomen: +epigastric tenderness, no masses palpated. No hepatosplenomegaly. Bowel sounds positive.  Musculoskeletal: no clubbing / cyanosis. No joint deformity upper and lower extremities. Good ROM, no contractures. Normal muscle tone.  Skin: no rashes, lesions, ulcers. No induration Neurologic: CN 2-12 grossly intact. Sensation intact,l. Strength 5/5 in all 4.  Psychiatric: Normal judgment and insight. Alert and oriented x 3. Normal mood.    Labs on Admission: I have personally reviewed following labs and imaging studies  CBC: Recent Labs  Lab 12/07/22 1603  WBC 10.9*  HGB 12.0  HCT 36.4  MCV 93.3  PLT 269   Basic Metabolic Panel: Recent Labs  Lab 12/07/22 1603  NA 136  K 3.7  CL 100  CO2 24  GLUCOSE 91  BUN 13  CREATININE 0.97  CALCIUM 9.4   GFR: Estimated Creatinine Clearance: 38.8 mL/min (by C-G formula based on SCr of 0.97 mg/dL). Liver Function Tests: Recent Labs  Lab 12/07/22 1603  AST 21  ALT 16  ALKPHOS 60  BILITOT 0.6  PROT 6.2*  ALBUMIN 3.5   No results for input(s): "LIPASE", "AMYLASE" in the last 168 hours. No results for input(s): "AMMONIA" in the last 168 hours. Coagulation Profile: No results for input(s): "INR",  "PROTIME" in the last 168 hours. Cardiac Enzymes: No results for input(s): "CKTOTAL", "CKMB", "CKMBINDEX", "TROPONINI" in the last 168 hours. BNP (last 3 results) Recent Labs    03/08/22 1226 05/14/22 1432 05/24/22 1308  PROBNP 6,969* 2,909* 2,900*   HbA1C: No results for input(s): "HGBA1C" in the last 72 hours. CBG: No results for input(s): "GLUCAP" in the last 168 hours. Lipid Profile: No results for input(s): "CHOL", "HDL", "LDLCALC", "TRIG", "CHOLHDL", "LDLDIRECT" in the last 72 hours. Thyroid Function Tests: No results for input(s): "TSH", "T4TOTAL", "FREET4", "T3FREE", "THYROIDAB" in the last 72 hours. Anemia Panel: No results for input(s): "VITAMINB12", "FOLATE", "FERRITIN", "TIBC", "IRON", "RETICCTPCT" in the last 72 hours. Urine analysis:    Component Value Date/Time   COLORURINE YELLOW 10/16/2021 1406   APPEARANCEUR Cloudy (A) 10/16/2021 1406   LABSPEC 1.020 10/16/2021 1406   PHURINE 5.5 10/16/2021 1406   GLUCOSEU NEGATIVE 10/16/2021 1406   HGBUR TRACE-INTACT (A) 10/16/2021 1406   HGBUR negative 07/19/2009 1509   BILIRUBINUR NEGATIVE 10/16/2021 1406   BILIRUBINUR neg 04/12/2020 1056   KETONESUR TRACE (A) 10/16/2021 1406   PROTEINUR Positive (A) 04/12/2020 1056   PROTEINUR NEGATIVE 08/17/2019 0457   UROBILINOGEN 0.2 10/16/2021 1406   NITRITE NEGATIVE 10/16/2021 1406   LEUKOCYTESUR MODERATE (A) 10/16/2021 1406    Radiological Exams on Admission: DG Chest Port 1 View  Result Date: 12/07/2022 CLINICAL DATA:  Shortness of breath, chest pain EXAM: PORTABLE CHEST 1 VIEW COMPARISON:  09/22/2022 FINDINGS: Atherosclerotic calcification of the aortic arch. Levoconvex lower thoracic scoliosis. Currently the heart size is within normal limits. The lungs appear clear. No blunting of the costophrenic angles (  with noted exclusion of the tip of the left costophrenic angle from imaging). IMPRESSION: 1. No active cardiopulmonary disease is radiographically apparent. 2. Levoconvex  lower thoracic scoliosis. 3.  Aortic Atherosclerosis (ICD10-I70.0). Electronically Signed   By: Van Clines M.D.   On: 12/07/2022 17:00    EKG: Independently reviewed.   Assessment/Plan  Chest pain r/o ACS -hx of non obstructive CAD -recent hx of ? Nstemi 4 weeks ago  -CE slightly positive with upward trend  -EKG w/o hyperacute changes  -patient now chest pain free  s/p tx with tylenol /maalox  -place on CP r/o protocol  -cycle CE  -echo cardiogram  -EKG in am  -asa,statin  -continue metoprolol  -nitro prn chest pain    SVT/PAF -continue on amiodarone, metoprolol ,Eliquis  -EKG currently noted sinus tachycardia   Hypertension -resume home regimen      Chronic CHFpef -continue metoprolol, losartan  -currently euvolemic    COPD, chronic respiratory failure on 2.5 L at night -resume inhalers   Esophageal strictures GERD -continue ppi  -possible cause of pain as well  -will need referral to gi as outpatient  Hypothyroidism -resume synthroid  -check tsh    Chronic back pain -supportive care     Hyperlipidemia -continue statin   Hx of CVA(dx 09/27/22) -continue secondary ppx with Eliquis    Rheumatoid arthritis -no currently flare  -resume home regimen   Idiopathic peripheral neuropathy  -followed by neurology   DVT prophylaxis:Eliquis  Code Status: full/ as discussed per patient wishes in event of cardiac arrest  Family Communication: none at bedside Disposition Plan: patient  expected to be admitted greater than 2 midnights  Consults called:n/a Admission status: progressive care   Clance Boll MD [facility]  If 7PM-7AM, please contact night-coverage [email or web address] Password [facility]  12/07/2022, 11:51 PM

## 2022-12-07 NOTE — H&P (Incomplete)
History and Physical    Julie Rice GHW:[id number] DOB: 1936/05/02 DOA: 12/07/2022  PCP: [name], NP  Patient coming from: home I have personally briefly reviewed patient's old medical records in [place]  Chief Complaint: chest pain   HPI: Julie Rice is a 87 y.o. female with medical history significant of  hypertension, rheumatoid arthritis, chronic CHF, CAD COPD, chronic respiratory failure on 2.5 L at night, esophageal strictures, hypothyroidism, chronic back pain , hyperlipidemia,TIA who presents from home with complaint of central chest pain.  ED Course: ***  Review of Systems: As per HPI otherwise 10 point review of systems negative.   Past Medical History:  Diagnosis Date  . A-fib ([place])   . Anemia   . Anxiety   . Bipolar disorder ([place])   . Blood transfusion 1991   autologous pts own blood given   . CAD (coronary artery disease)   . Cataract   . Chest pain    "@ rest, lying down, w/exertion"  . CHF (congestive heart failure) ([place]) 07/2020  . Chronic back pain    "mostly lower back but I do have upper back pain regularly" (04/06/2018)  . COPD (chronic obstructive pulmonary disease) ([place])   . Depression   . Esophageal stricture   . Fibromyalgia   . GERD (gastroesophageal reflux disease)   . Heart murmur    "slight" (04/06/2018)  . Hiatal hernia   . Hyperlipidemia    Patient denies  . Hypertension   . Internal hemorrhoids   . Lumbago   . Mitral regurgitation   . Osteoarthritis   . Osteoporosis   . Pneumonia ~ 01/2018  . PONV (postoperative nausea and vomiting)    severe ponv, "in the past" (04/06/2018)  . Rectal bleeding   . Rheumatoid arthritis ([place])   . Spondylosis   . TIA (transient ischemic attack) 2013  . Urinary incontinence    wears depends    Past Surgical History:  Procedure Laterality Date  . ABDOMINAL HYSTERECTOMY  1982  . BACK SURGERY    . BALLOON DILATION N/A 09/21/2018   Procedure: BALLOON DILATION;  Surgeon: Irene Shipper, MD;  Location: [facility];  Service: Endoscopy;  Laterality: N/A;  . BALLOON DILATION N/A 08/12/2019   Procedure: BALLOON DILATION;  Surgeon: Jackquline Denmark, MD;  Location: [facility] ENDOSCOPY;  Service: Endoscopy;  Laterality: N/A;  . BIOPSY  08/12/2019   Procedure: BIOPSY;  Surgeon: Jackquline Denmark, MD;  Location: [facility] ENDOSCOPY;  Service: Endoscopy;;  . BLADDER SUSPENSION  1980's  . BOTOX INJECTION N/A 09/21/2018   Procedure: BOTOX INJECTION;  Surgeon: Irene Shipper, MD;  Location: [facility] ENDOSCOPY;  Service: Endoscopy;  Laterality: N/A;  . BOTOX INJECTION N/A 08/12/2019   Procedure: BOTOX INJECTION;  Surgeon: Jackquline Denmark, MD;  Location: [facility] ENDOSCOPY;  Service: Endoscopy;  Laterality: N/A;  . BOTOX INJECTION N/A 09/27/2022   Procedure: BOTOX INJECTION;  Surgeon: Doran Stabler, MD;  Location: [facility];  Service: Gastroenterology;  Laterality: N/A;  . CATARACT EXTRACTION W/ INTRAOCULAR LENS  IMPLANT, BILATERAL Bilateral 2010  . DILATION AND CURETTAGE OF UTERUS  1961  . ESOPHAGEAL MANOMETRY N/A 03/25/2018   Procedure: ESOPHAGEAL MANOMETRY (EM);  Surgeon: Mauri Pole, MD;  Location: [facility] ENDOSCOPY;  Service: Endoscopy;  Laterality: N/A;  . ESOPHAGOGASTRODUODENOSCOPY (EGD) WITH PROPOFOL N/A 04/07/2018   Procedure: ESOPHAGOGASTRODUODENOSCOPY (EGD) WITH PROPOFOL;  Surgeon: Doran Stabler, MD;  Location: [facility];  Service: Gastroenterology;  Laterality: N/A;  . ESOPHAGOGASTRODUODENOSCOPY (EGD) WITH  PROPOFOL N/A 09/21/2018   Procedure: ESOPHAGOGASTRODUODENOSCOPY (EGD) WITH PROPOFOL;  Surgeon: Irene Shipper, MD;  Location: [facility] ENDOS[facility];  Service: Endoscopy;  Laterality: N/A;  . ESOPHAGOGASTRODUODENOSCOPY (EGD) WITH PROPOFOL N/A 08/12/2019   Procedure: ESOPHAGOGASTRODUODENOSCOPY (EGD) WITH PROPOFOL;  Surgeon: Jackquline Denmark, MD;  Location: [facility] ENDOSCOPY;  Service: Endoscopy;  Laterality: N/A;  . ESOPHAGOGASTRODUODENOSCOPY (EGD) WITH PROPOFOL N/A 09/27/2022   Procedure:  ESOPHAGOGASTRODUODENOSCOPY (EGD) WITH PROPOFOL;  Surgeon: Doran Stabler, MD;  Location: [place];  Service: Gastroenterology;  Laterality: N/A;  . HERNIA REPAIR    . HIATAL HERNIA REPAIR N/A 08/02/2016   Procedure: LAPAROSCOPIC REPAIR OF LARGE  HIATAL HERNIA;  Surgeon: Excell Seltzer, MD;  Location: [facility];  Service: General;  Laterality: N/A;  . JOINT REPLACEMENT    . LAPAROSCOPIC NISSEN FUNDOPLICATION N/A 1/61/0960   Procedure: LAPAROSCOPIC NISSEN FUNDOPLICATION;  Surgeon: Excell Seltzer, MD;  Location: [facility] ORS;  Service: General;  Laterality: N/A;  . [place]; 1994; [id number]   "I've got 2 stainless steel rods; 6 screws; 2 ray cages"took bone from right hip to put in back  . RIGHT/LEFT HEART CATH AND CORONARY ANGIOGRAPHY N/A 07/26/2019   Procedure: RIGHT/LEFT HEART CATH AND CORONARY ANGIOGRAPHY;  Surgeon: Belva Crome, MD;  Location: [facility] CV LAB;  Service: Cardiovascular;  Laterality: N/A;  . SVT ABLATION N/A 04/25/2021   Procedure: SVT ABLATION;  Surgeon: Constance Haw, MD;  Location: [facility] CV LAB;  Service: Cardiovascular;  Laterality: N/A;  . TEE WITHOUT CARDIOVERSION N/A 08/19/2019   Procedure: TRANSESOPHAGEAL ECHOCARDIOGRAM (TEE);  Surgeon: Lelon Perla, MD;  Location: [facility];  Service: Cardiovascular;  Laterality: N/A;  . [place]  . TOTAL KNEE ARTHROPLASTY Right ~ 1996  . TUBAL LIGATION  ~ 1976     reports that she has never smoked. She has never used smokeless tobacco. She reports that she does not drink alcohol and does not use drugs.  Allergies  Allergen Reactions  . Tape Other (See Comments)    Band aides, adhesive tape Redness and pulls skin off  . Tramadol Other (See Comments)    Pt has prolonged Qtc interval of 540- cannot give tramadol per pharmacy  . Morphine Itching and Rash    Flushing    Family History  Problem Relation Age of Onset  . Kidney disease Mother   .  Hypertension Mother   . Heart disease Father 87       die of MI at age 85  . Heart disease Brother   . Heart attack Brother   . Suicidality Son   . Other Son        MVA  . Colon cancer Neg Hx   . Esophageal cancer Neg Hx   . Pancreatic cancer Neg Hx   . Rectal cancer Neg Hx   . Stomach cancer Neg Hx    *** Prior to Admission medications   Medication Sig Start Date End Date Taking? Authorizing Provider  acetaminophen (TYLENOL) 500 MG tablet Take 1,000 mg by mouth every 6 (six) hours as needed for moderate pain, headache or fever.   Yes [provider]  albuterol (VENTOLIN HFA) 108 (90 Base) MCG/ACT inhaler TAKE 2 PUFFS BY MOUTH EVERY 6 HOURS AS NEEDED FOR WHEEZE OR SHORTNESS OF BREATH Patient taking differently: Inhale 2 puffs into the lungs every 6 (six) hours as needed for wheezing or shortness of breath. 08/06/22  Yes Nafziger, Tommi Rumps, NP  amiodarone (PACERONE) 200 MG tablet  Take 1 tablet (200 mg total) by mouth daily. 09/29/22  Yes Gherghe, Vella Redhead, MD  buPROPion (WELLBUTRIN XL) 300 MG 24 hr tablet TAKE 1 TABLET BY MOUTH EVERY DAY 12/03/22  Yes Nafziger, Tommi Rumps, NP  Carboxymethylcellulose Sodium (REFRESH TEARS OP) Place 1 drop into both eyes 2 (two) times daily.   Yes [provider]  cetirizine (ZYRTEC) 10 MG tablet Take 10 mg by mouth daily as needed for allergies. 02/20/21  Yes [provider]  clonazePAM (KLONOPIN) 0.5 MG tablet Take 0.5 tablets (0.25 mg total) by mouth 2 (two) times daily as needed for anxiety. 09/29/22 12/08/23 Yes Gherghe, Vella Redhead, MD  DULoxetine (CYMBALTA) 60 MG capsule TAKE 1 CAPSULE BY MOUTH EVERY DAY Patient taking differently: Take 60 mg by mouth daily. 11/30/21  Yes Lovorn, Megan, MD  ELIQUIS 5 MG TABS tablet TAKE 1 TABLET BY MOUTH TWICE A DAY Patient taking differently: Take 5 mg by mouth 2 (two) times daily. 12/03/22  Yes Camnitz, Ocie Doyne, MD  FeFum-FePoly-FA-B Cmp-C-Biot (FOLIVANE-PLUS) CAPS TAKE 1 CAPSULE BY MOUTH EVERY DAY IN  THE MORNING Patient taking differently: Take 1 capsule by mouth daily. 11/25/22  Yes Heilingoetter, Cassandra L, PA-C  KLOR-CON M20 20 MEQ tablet TAKE 1 TABLET BY MOUTH EVERY DAY 08/06/22  Yes Weaver, Scott T, PA-C  losartan (COZAAR) 50 MG tablet TAKE 1 TABLET BY MOUTH EVERY DAY 07/18/22  Yes Camnitz, Ocie Doyne, MD  metoprolol tartrate (LOPRESSOR) 25 MG tablet Take 0.5 tablets (12.5 mg total) by mouth 2 (two) times daily. 09/29/22  Yes Gherghe, Vella Redhead, MD  Multiple Vitamins-Minerals (ONE-A-DAY WOMENS 50+) TABS Take 1 tablet by mouth daily.   Yes [provider]  omeprazole (PRILOSEC) 20 MG capsule Take 20 mg by mouth daily as needed (for heartburn). 12/03/22  Yes [provider]  rosuvastatin (CRESTOR) 20 MG tablet Take 1 tablet (20 mg total) by mouth daily at 6 PM. 09/29/22  Yes Gherghe, Vella Redhead, MD  sodium chloride (OCEAN) 0.65 % SOLN nasal spray Place 1 spray into both nostrils as needed for congestion.   Yes [provider]    Physical Exam: Vitals:   12/07/22 2030 12/07/22 2100 12/07/22 2130 12/07/22 2329  BP: (!) 150/77 (!) 150/78 (!) 155/89 139/84  Pulse: (!) 103 94 97 100  Resp: 12 19 (!) 21 15  Temp:      TempSrc:      SpO2: 100% 99% 100% 100%  Weight:      Height:        Constitutional: NAD, calm, comfortable Vitals:   12/07/22 2030 12/07/22 2100 12/07/22 2130 12/07/22 2329  BP: (!) 150/77 (!) 150/78 (!) 155/89 139/84  Pulse: (!) 103 94 97 100  Resp: 12 19 (!) 21 15  Temp:      TempSrc:      SpO2: 100% 99% 100% 100%  Weight:      Height:       Eyes: PERRL, lids and conjunctivae normal ENMT: Mucous membranes are moist. Posterior pharynx clear of any exudate or lesions.Normal dentition.  Neck: normal, supple, no masses, no thyromegaly Respiratory: clear to auscultation bilaterally, no wheezing, no crackles. Normal respiratory effort. No accessory muscle use.  Cardiovascular: Regular rate and rhythm, no murmurs / rubs / gallops. No extremity  edema. 2+ pedal pulses. No carotid bruits.  Abdomen: no tenderness, no masses palpated. No hepatosplenomegaly. Bowel sounds positive.  Musculoskeletal: no clubbing / cyanosis. No joint deformity upper and lower extremities. Good ROM, no contractures. Normal muscle  tone.  Skin: no rashes, lesions, ulcers. No induration Neurologic: CN 2-12 grossly intact. Sensation intact, DTR normal. Strength 5/5 in all 4.  Psychiatric: Normal judgment and insight. Alert and oriented x 3. Normal mood.    Labs on Admission: I have personally reviewed following labs and imaging studies  CBC: Recent Labs  Lab 12/07/22 1603  WBC 10.9*  HGB 12.0  HCT 36.4  MCV 93.3  PLT 284   Basic Metabolic Panel: Recent Labs  Lab 12/07/22 1603  NA 136  K 3.7  CL 100  CO2 24  GLUCOSE 91  BUN 13  CREATININE 0.97  CALCIUM 9.4   GFR: Estimated Creatinine Clearance: 38.8 mL/min (by C-G formula based on SCr of 0.97 mg/dL). Liver Function Tests: Recent Labs  Lab 12/07/22 1603  AST 21  ALT 16  ALKPHOS 60  BILITOT 0.6  PROT 6.2*  ALBUMIN 3.5   No results for input(s): "LIPASE", "AMYLASE" in the last 168 hours. No results for input(s): "AMMONIA" in the last 168 hours. Coagulation Profile: No results for input(s): "INR", "PROTIME" in the last 168 hours. Cardiac Enzymes: No results for input(s): "CKTOTAL", "CKMB", "CKMBINDEX", "TROPONINI" in the last 168 hours. BNP (last 3 results) Recent Labs    03/08/22 1226 05/14/22 1432 05/24/22 1308  PROBNP 6,969* 2,909* 2,900*   HbA1C: No results for input(s): "HGBA1C" in the last 72 hours. CBG: No results for input(s): "GLUCAP" in the last 168 hours. Lipid Profile: No results for input(s): "CHOL", "HDL", "LDLCALC", "TRIG", "CHOLHDL", "LDLDIRECT" in the last 72 hours. Thyroid Function Tests: No results for input(s): "TSH", "T4TOTAL", "FREET4", "T3FREE", "THYROIDAB" in the last 72 hours. Anemia Panel: No results for input(s): "VITAMINB12", "FOLATE",  "FERRITIN", "TIBC", "IRON", "RETICCTPCT" in the last 72 hours. Urine analysis:    Component Value Date/Time   COLORURINE YELLOW 10/16/2021 1406   APPEARANCEUR Cloudy (A) 10/16/2021 1406   LABSPEC 1.020 10/16/2021 1406   PHURINE 5.5 10/16/2021 1406   GLUCOSEU NEGATIVE 10/16/2021 1406   HGBUR TRACE-INTACT (A) 10/16/2021 1406   HGBUR negative 07/19/2009 1509   BILIRUBINUR NEGATIVE 10/16/2021 1406   BILIRUBINUR neg 04/12/2020 1056   KETONESUR TRACE (A) 10/16/2021 1406   PROTEINUR Positive (A) 04/12/2020 1056   PROTEINUR NEGATIVE 08/17/2019 0457   UROBILINOGEN 0.2 10/16/2021 1406   NITRITE NEGATIVE 10/16/2021 1406   LEUKOCYTESUR MODERATE (A) 10/16/2021 1406    Radiological Exams on Admission: DG Chest Port 1 View  Result Date: 12/07/2022 CLINICAL DATA:  Shortness of breath, chest pain EXAM: PORTABLE CHEST 1 VIEW COMPARISON:  09/22/2022 FINDINGS: Atherosclerotic calcification of the aortic arch. Levoconvex lower thoracic scoliosis. Currently the heart size is within normal limits. The lungs appear clear. No blunting of the costophrenic angles (with noted exclusion of the tip of the left costophrenic angle from imaging). IMPRESSION: 1. No active cardiopulmonary disease is radiographically apparent. 2. Levoconvex lower thoracic scoliosis. 3.  Aortic Atherosclerosis (ICD10-I70.0). Electronically Signed   By: Gaylyn Rong M.D.   On: 12/07/2022 17:00    EKG: Independently reviewed. ***  Assessment/Plan Active Problems:   * No active hospital problems. *   ***  DVT prophylaxis: *** (Lovenox/Heparin/SCD's/anticoagulated/None (if comfort care) Code Status: *** (Full/Partial (specify details) Family Communication: *** (Specify name, relationship. Do not write "discussed with patient". Specify tel # if discussed over the phone) Disposition Plan: *** (specify when and where you expect patient to be discharged) Consults called: *** (with names) Admission status: *** (inpatient / obs / tele  / medical floor / SDU)  Lurline Del MD [facility] Pager [phone]  If 7PM-7AM, please contact night-coverage [email or web address] Password [email or web address]  12/07/2022, 11:51 PM

## 2022-12-07 NOTE — ED Triage Notes (Signed)
Patient was in the hospital about a month ago for a heart attack, fluid around her heart, then to rehab for 2 weeks, now home for 2 weeks but having chest pain for the last few days, weakness, and short of breath  Worsening pain today, pain to her left arm and left jaw

## 2022-12-08 ENCOUNTER — Inpatient Hospital Stay (HOSPITAL_BASED_OUTPATIENT_CLINIC_OR_DEPARTMENT_OTHER): Payer: Medicare Other

## 2022-12-08 ENCOUNTER — Other Ambulatory Visit (HOSPITAL_COMMUNITY): Payer: Medicare Other

## 2022-12-08 DIAGNOSIS — R079 Chest pain, unspecified: Secondary | ICD-10-CM | POA: Diagnosis not present

## 2022-12-08 DIAGNOSIS — R0789 Other chest pain: Secondary | ICD-10-CM | POA: Diagnosis not present

## 2022-12-08 DIAGNOSIS — R7989 Other specified abnormal findings of blood chemistry: Secondary | ICD-10-CM | POA: Diagnosis not present

## 2022-12-08 DIAGNOSIS — I4891 Unspecified atrial fibrillation: Secondary | ICD-10-CM | POA: Diagnosis not present

## 2022-12-08 DIAGNOSIS — R030 Elevated blood-pressure reading, without diagnosis of hypertension: Secondary | ICD-10-CM | POA: Diagnosis not present

## 2022-12-08 LAB — TROPONIN I (HIGH SENSITIVITY)
Troponin I (High Sensitivity): 25 ng/L — ABNORMAL HIGH (ref <18)
Troponin I (High Sensitivity): 27 ng/L — ABNORMAL HIGH (ref <18)

## 2022-12-08 LAB — ECHOCARDIOGRAM COMPLETE
AR max vel: 2.21 cm2
AV Area VTI: 2.12 cm2
AV Area mean vel: 1.92 cm2
AV Mean grad: 4 mmHg
AV Peak grad: 5.9 mmHg
Ao pk vel: 1.21 m/s
Area-P 1/2: 2.14 cm2
Height: 63 in
S' Lateral: 2.1 cm
Weight: 2308.8 oz

## 2022-12-08 MED ORDER — ADULT MULTIVITAMIN W/MINERALS CH
1.0000 | ORAL_TABLET | Freq: Every day | ORAL | Status: DC
  Administered 2022-12-08 – 2022-12-09 (×2): 1 via ORAL
  Filled 2022-12-08 (×2): qty 1

## 2022-12-08 MED ORDER — LORATADINE 10 MG PO TABS
10.0000 mg | ORAL_TABLET | Freq: Every day | ORAL | Status: DC
  Administered 2022-12-08 – 2022-12-09 (×2): 10 mg via ORAL
  Filled 2022-12-08 (×2): qty 1

## 2022-12-08 MED ORDER — ALBUTEROL SULFATE (2.5 MG/3ML) 0.083% IN NEBU: 3.0000 mL | INHALATION_SOLUTION | Freq: Four times a day (QID) | RESPIRATORY_TRACT | Status: DC | PRN

## 2022-12-08 MED ORDER — FOLIVANE-PLUS PO CAPS: 1.0000 | ORAL_CAPSULE | Freq: Every day | ORAL | Status: DC

## 2022-12-08 MED ORDER — ALUM & MAG HYDROXIDE-SIMETH 200-200-20 MG/5ML PO SUSP
30.0000 mL | Freq: Four times a day (QID) | ORAL | Status: DC | PRN
  Administered 2022-12-08: 30 mL via ORAL
  Filled 2022-12-08: qty 30

## 2022-12-08 MED ORDER — ROSUVASTATIN CALCIUM 20 MG PO TABS
20.0000 mg | ORAL_TABLET | Freq: Every day | ORAL | Status: DC
  Administered 2022-12-08: 20 mg via ORAL
  Filled 2022-12-08: qty 1

## 2022-12-08 MED ORDER — CLONAZEPAM 0.25 MG PO TBDP
0.2500 mg | ORAL_TABLET | Freq: Two times a day (BID) | ORAL | Status: DC | PRN
  Administered 2022-12-08: 0.25 mg via ORAL
  Filled 2022-12-08: qty 1

## 2022-12-08 MED ORDER — PANTOPRAZOLE SODIUM 40 MG PO TBEC
40.0000 mg | DELAYED_RELEASE_TABLET | Freq: Every day | ORAL | Status: DC
  Administered 2022-12-08 – 2022-12-09 (×2): 40 mg via ORAL
  Filled 2022-12-08 (×3): qty 1

## 2022-12-08 MED ORDER — METOPROLOL TARTRATE 12.5 MG HALF TABLET
12.5000 mg | ORAL_TABLET | Freq: Two times a day (BID) | ORAL | Status: DC
  Administered 2022-12-08 – 2022-12-09 (×4): 12.5 mg via ORAL
  Filled 2022-12-08 (×4): qty 1

## 2022-12-08 MED ORDER — APIXABAN 5 MG PO TABS
5.0000 mg | ORAL_TABLET | Freq: Two times a day (BID) | ORAL | Status: DC
  Administered 2022-12-08 – 2022-12-09 (×4): 5 mg via ORAL
  Filled 2022-12-08 (×4): qty 1

## 2022-12-08 MED ORDER — ACETAMINOPHEN 325 MG PO TABS
650.0000 mg | ORAL_TABLET | ORAL | Status: DC | PRN
  Administered 2022-12-09: 650 mg via ORAL
  Filled 2022-12-08: qty 2

## 2022-12-08 MED ORDER — AMIODARONE HCL 200 MG PO TABS
200.0000 mg | ORAL_TABLET | Freq: Every day | ORAL | Status: DC
  Administered 2022-12-08 – 2022-12-09 (×2): 200 mg via ORAL
  Filled 2022-12-08 (×2): qty 1

## 2022-12-08 MED ORDER — ACETAMINOPHEN 500 MG PO TABS: 1000.0000 mg | ORAL_TABLET | Freq: Four times a day (QID) | ORAL | Status: DC | PRN

## 2022-12-08 MED ORDER — LOSARTAN POTASSIUM 50 MG PO TABS
50.0000 mg | ORAL_TABLET | Freq: Every day | ORAL | Status: DC
  Administered 2022-12-08 – 2022-12-09 (×2): 50 mg via ORAL
  Filled 2022-12-08 (×2): qty 1

## 2022-12-08 NOTE — ED Notes (Signed)
ED TO INPATIENT HANDOFF REPORT  ED Nurse Name and Phone #: Mosie Lukes RN [phone]   S Name/Age/Gender Julie Rice 87 y.o. female Room/Bed: 034C/034C  Code Status   Code Status: Full Code  Home/SNF/Other Home Patient oriented to: self, place, time, and situation Is this baseline? Yes   Triage Complete: Triage complete  Chief Complaint Chest pain [R07.9]  Triage Note Patient was in the hospital about a month ago for a heart attack, fluid around her heart, then to rehab for 2 weeks, now home for 2 weeks but having chest pain for the last few days, weakness, and short of breath  Worsening pain today, pain to her left arm and left jaw    Allergies Allergies  Allergen Reactions   Tape Other (See Comments)    Band aides, adhesive tape Redness and pulls skin off   Tramadol Other (See Comments)    Pt has prolonged Qtc interval of 540- cannot give tramadol per pharmacy   Morphine Itching and Rash    Flushing    Level of Care/Admitting Diagnosis ED Disposition     ED Disposition  Admit   Condition  --   [place]: [facility] [[id number]]  Level of Care: Telemetry Medical [104]  May admit patient to [facility] or [facility] if equivalent level of care is available:: Yes  Covid Evaluation: Confirmed COVID Negative  Diagnosis: Chest pain [902409]  Admitting Physician: Clance Boll [[id number]]  Attending Physician: Clance Boll [[id number]]  Certification:: I certify this patient will need inpatient services for at least 2 midnights          B Medical/Surgery History Past Medical History:  Diagnosis Date   A-fib ([place])    Anemia    Anxiety    Bipolar disorder ([place])    Blood transfusion 1991   autologous pts own blood given    CAD (coronary artery disease)    Cataract    Chest pain    "@ rest, lying down, w/exertion"   CHF (congestive heart failure) ([place]) 07/2020   Chronic back pain    "mostly lower back but I do  have upper back pain regularly" (04/06/2018)   COPD (chronic obstructive pulmonary disease) ([place])    Depression    Esophageal stricture    Fibromyalgia    GERD (gastroesophageal reflux disease)    Heart murmur    "slight" (04/06/2018)   Hiatal hernia    Hyperlipidemia    Patient denies   Hypertension    Internal hemorrhoids    Lumbago    Mitral regurgitation    Osteoarthritis    Osteoporosis    Pneumonia ~ 01/2018   PONV (postoperative nausea and vomiting)    severe ponv, "in the past" (04/06/2018)   Rectal bleeding    Rheumatoid arthritis ([place])    Spondylosis    TIA (transient ischemic attack) 2013   Urinary incontinence    wears depends   Past Surgical History:  Procedure Laterality Date   ABDOMINAL HYSTERECTOMY  1982   BACK SURGERY     BALLOON DILATION N/A 09/21/2018   Procedure: BALLOON DILATION;  Surgeon: Irene Shipper, MD;  Location: [facility];  Service: Endoscopy;  Laterality: N/A;   BALLOON DILATION N/A 08/12/2019   Procedure: BALLOON DILATION;  Surgeon: Jackquline Denmark, MD;  [facility] ENDOSCOPY;  Service: Endoscopy;  Laterality: N/A;   BIOPSY  08/12/2019   Procedure: BIOPSY;  Surgeon: Jackquline Denmark, MD;  Location: [facility] ENDOSCOPY;  Service: Endoscopy;;   BLADDER SUSPENSION  1980's   BOTOX INJECTION N/A 09/21/2018   Procedure: BOTOX INJECTION;  Surgeon: Irene Shipper, MD;  [facility]: [facility];  Service: Endoscopy;  Laterality: N/A;   BOTOX INJECTION N/A 08/12/2019   Procedure: BOTOX INJECTION;  Surgeon: Jackquline Denmark, MD;  Location: [facility] ENDOSCOPY;  Service: Endoscopy;  Laterality: N/A;   BOTOX INJECTION N/A 09/27/2022   Procedure: BOTOX INJECTION;  Surgeon: Doran Stabler, MD;  Location: [place];  Service: Gastroenterology;  Laterality: N/A;   CATARACT EXTRACTION W/ INTRAOCULAR LENS  IMPLANT, BILATERAL Bilateral 2010   DILATION AND CURETTAGE OF UTERUS  1961   ESOPHAGEAL MANOMETRY N/A 03/25/2018   Procedure: ESOPHAGEAL MANOMETRY (EM);  Surgeon: Mauri Pole, MD;  [facility]: [facility];  Service: Endoscopy;  Laterality: N/A;   ESOPHAGOGASTRODUODENOSCOPY (EGD) WITH PROPOFOL N/A 04/07/2018   Procedure: ESOPHAGOGASTRODUODENOSCOPY (EGD) WITH PROPOFOL;  Surgeon: Doran Stabler, MD;  Location: [facility];  Service: Gastroenterology;  Laterality: N/A;   ESOPHAGOGASTRODUODENOSCOPY (EGD) WITH PROPOFOL N/A 09/21/2018   Procedure: ESOPHAGOGASTRODUODENOSCOPY (EGD) WITH PROPOFOL;  Surgeon: Irene Shipper, MD;  [facility];  Service: Endoscopy;  Laterality: N/A;   ESOPHAGOGASTRODUODENOSCOPY (EGD) WITH PROPOFOL N/A 08/12/2019   Procedure: ESOPHAGOGASTRODUODENOSCOPY (EGD) WITH PROPOFOL;  Surgeon: Jackquline Denmark, MD;  Location: [facility] ENDOSCOPY;  Service: Endoscopy;  Laterality: N/A;   ESOPHAGOGASTRODUODENOSCOPY (EGD) WITH PROPOFOL N/A 09/27/2022   Procedure: ESOPHAGOGASTRODUODENOSCOPY (EGD) WITH PROPOFOL;  Surgeon: Doran Stabler, MD;  Location: [facility];  Service: Gastroenterology;  Laterality: N/A;   HERNIA REPAIR     HIATAL HERNIA REPAIR N/A 08/02/2016   Procedure: LAPAROSCOPIC REPAIR OF LARGE  HIATAL HERNIA;  Surgeon: Excell Seltzer, MD;  Location: [facility];  Service: General;  Laterality: N/A;   JOINT REPLACEMENT     LAPAROSCOPIC NISSEN FUNDOPLICATION N/A [id number]   Procedure: LAPAROSCOPIC NISSEN FUNDOPLICATION;  Surgeon: Excell Seltzer, MD;  Location: [facility];  Service: General;  Laterality: N/A;   [place]; [id number]   "I've got 2 stainless steel rods; 6 screws; 2 ray cages"took bone from right hip to put in back   RIGHT/LEFT HEART CATH AND CORONARY ANGIOGRAPHY N/A 07/26/2019   Procedure: RIGHT/LEFT HEART CATH AND CORONARY ANGIOGRAPHY;  Surgeon: Belva Crome, MD;  Location: [facility] CV LAB;  Service: Cardiovascular;  Laterality: N/A;   SVT ABLATION N/A 04/25/2021   Procedure: SVT ABLATION;  Surgeon: Constance Haw, MD;  Location: [facility];  Service: Cardiovascular;  Laterality: N/A;   TEE WITHOUT  CARDIOVERSION N/A 08/19/2019   Procedure: TRANSESOPHAGEAL ECHOCARDIOGRAM (TEE);  Surgeon: Lelon Perla, MD;  Location: [facility] ENDOSCOPY;  Service: Cardiovascular;  Laterality: N/A;   [place] Right ~ [place]  ~ 1976     A IV Location/Drains/Wounds Patient Lines/Drains/Airways Status     Active Line/Drains/Airways     Name Placement date Placement time Site Days   Peripheral IV 12/07/22 20 G 1" Anterior;Proximal;Right Forearm 12/07/22  1529  Forearm  1   External Urinary Catheter 09/23/22  1644  --  76            Intake/Output Last 24 hours No intake or output data in the 24 hours ending 12/08/22 0117  Labs/Imaging Results for orders placed or performed during the hospital encounter of 12/07/22 (from the past 48 hour(s))  CBC     Status: Abnormal   Collection Time: 12/07/22  4:03  PM  Result Value Ref Range   WBC 10.9 (H) 4.0 - 10.5 K/uL   RBC 3.90 3.87 - 5.11 MIL/uL   Hemoglobin 12.0 12.0 - 15.0 g/dL   HCT 36.4 36.0 - 46.0 %   MCV 93.3 80.0 - 100.0 fL   MCH 30.8 26.0 - 34.0 pg   MCHC 33.0 30.0 - 36.0 g/dL   RDW 14.3 11.5 - 15.5 %   Platelets 284 150 - 400 K/uL   nRBC 0.0 0.0 - 0.2 %    Comment: Performed at [facility] [address], [place], [place] [address]  Comprehensive metabolic panel     Status: Abnormal   Collection Time: 12/07/22  4:03 PM  Result Value Ref Range   Sodium 136 135 - 145 mmol/L   Potassium 3.7 3.5 - 5.1 mmol/L   Chloride 100 98 - 111 mmol/L   CO2 24 22 - 32 mmol/L   Glucose, Bld 91 70 - 99 mg/dL    Comment: Glucose reference range applies only to samples taken after fasting for at least 8 hours.   BUN 13 8 - 23 mg/dL   Creatinine, Ser 0.97 0.44 - 1.00 mg/dL   Calcium 9.4 8.9 - 10.3 mg/dL   Total Protein 6.2 (L) 6.5 - 8.1 g/dL   Albumin 3.5 3.5 - 5.0 g/dL   AST 21 15 - 41 U/L   ALT 16 0 - 44 U/L   Alkaline Phosphatase 60 38 - 126 U/L   Total Bilirubin 0.6 0.3  - 1.2 mg/dL   GFR, Estimated 58 (L) >60 mL/min    Comment: (NOTE) Calculated using the CKD-EPI Creatinine Equation (2021)    Anion gap 12 5 - 15    Comment: Performed at [facility] [address], [place], [place] [address]  Brain natriuretic peptide     Status: Abnormal   Collection Time: 12/07/22  4:03 PM  Result Value Ref Range   B Natriuretic Peptide 217.6 (H) 0.0 - 100.0 pg/mL    Comment: Performed at [facility] [address], [place], [place] [address]  Troponin I (High Sensitivity)     Status: None   Collection Time: 12/07/22  4:03 PM  Result Value Ref Range   Troponin I (High Sensitivity) 15 <18 ng/L    Comment: (NOTE) Elevated high sensitivity troponin I (hsTnI) values and significant  changes across serial measurements may suggest ACS but many other  chronic and acute conditions are known to elevate hsTnI results.  Refer to the "Links" section for chest pain algorithms and additional  guidance. Performed at [facility], [place] [address], [place], [place] [address]   Troponin I (High Sensitivity)     Status: Abnormal   Collection Time: 12/07/22  6:00 PM  Result Value Ref Range   Troponin I (High Sensitivity) 19 (H) <18 ng/L    Comment: (NOTE) Elevated high sensitivity troponin I (hsTnI) values and significant  changes across serial measurements may suggest ACS but many other  chronic and acute conditions are known to elevate hsTnI results.  Refer to the "Links" section for chest pain algorithms and additional  guidance. Performed at [facility], [place] [address], [place], [place] [address]   D-dimer, quantitative     Status: None   Collection Time: 12/07/22  6:00 PM  Result Value Ref Range   D-Dimer, Quant <0.27 0.00 - 0.50 ug/mL-FEU    Comment: (NOTE) At the manufacturer cut-off value of 0.5  g/mL FEU, this assay has a negative predictive value of 95-100%.This assay is intended for use in conjunction with a clinical pretest  probability (PTP) assessment model to exclude pulmonary embolism (PE) and deep venous thrombosis (DVT) in outpatients suspected of PE or DVT. Results should be correlated with clinical presentation. Performed at [facility], [address] [place], [place] [address]   Resp panel by RT-PCR (RSV, Flu A&B, Covid) Anterior Nasal Swab     Status: None   Collection Time: 12/07/22  6:08 PM   Specimen: Anterior Nasal Swab  Result Value Ref Range   SARS Coronavirus 2 by RT PCR NEGATIVE NEGATIVE   Influenza A by PCR NEGATIVE NEGATIVE   Influenza B by PCR NEGATIVE NEGATIVE    Comment: (NOTE) The Xpert Xpress SARS-CoV-2/FLU/RSV plus assay is intended as an aid in the diagnosis of influenza from Nasopharyngeal swab specimens and should not be used as a sole basis for treatment. Nasal washings and aspirates are unacceptable for Xpert Xpress SARS-CoV-2/FLU/RSV testing.  Fact Sheet for Patients: BloggerCourse.com  Fact Sheet for Healthcare Providers: SeriousBroker.it  This test is not yet approved or cleared by the [place] FDA and has been authorized for detection and/or diagnosis of SARS-CoV-2 by FDA under an Emergency Use Authorization (EUA). This EUA will remain in effect (meaning this test can be used) for the duration of the COVID-19 declaration under Section 564(b)(1) of the Act, 21 U.S.C. section 360bbb-3(b)(1), unless the authorization is terminated or revoked.     Resp Syncytial Virus by PCR NEGATIVE NEGATIVE    Comment: (NOTE) Fact Sheet for Patients: BloggerCourse.com  Fact Sheet for Healthcare Providers: SeriousBroker.it  This test is not yet approved or cleared by the [place] FDA and has been authorized for detection and/or diagnosis of SARS-CoV-2 by FDA under an Emergency Use Authorization (EUA). This EUA will remain in effect (meaning this test can be  used) for the duration of the COVID-19 declaration under Section 564(b)(1) of the Act, 21 U.S.C. section 360bbb-3(b)(1), unless the authorization is terminated or revoked.  Performed at [facility], [address], [place], [place] [address]   Troponin I (High Sensitivity)     Status: Abnormal   Collection Time: 12/07/22  9:58 PM  Result Value Ref Range   Troponin I (High Sensitivity) 23 (H) <18 ng/L    Comment: (NOTE) Elevated high sensitivity troponin I (hsTnI) values and significant  changes across serial measurements may suggest ACS but many other  chronic and acute conditions are known to elevate hsTnI results.  Refer to the "Links" section for chest pain algorithms and additional  guidance. Performed at [facility], [address] [place], [place] [address]    *Note: Due to a large number of results and/or encounters for the requested time period, some results have not been displayed. A complete set of results can be found in Results Review.   DG Chest Port 1 View  Result Date: 12/07/2022 CLINICAL DATA:  Shortness of breath, chest pain EXAM: PORTABLE CHEST 1 VIEW COMPARISON:  09/22/2022 FINDINGS: Atherosclerotic calcification of the aortic arch. Levoconvex lower thoracic scoliosis. Currently the heart size is within normal limits. The lungs appear clear. No blunting of the costophrenic angles (with noted exclusion of the tip of the left costophrenic angle from imaging). IMPRESSION: 1. No active cardiopulmonary disease is radiographically apparent. 2. Levoconvex lower thoracic scoliosis. 3.  Aortic Atherosclerosis (ICD10-I70.0). Electronically Signed   By: Gaylyn Rong M.D.   On:  12/07/2022 17:00    Pending Labs Unresulted Labs (From admission, onward)    None       Vitals/Pain Today's Vitals   12/07/22 2100 12/07/22 2130 12/07/22 2329 12/08/22 0000  BP: (!) 150/78 (!) 155/89 139/84 133/82  Pulse: 94 97 100 90  Resp: 19 (!) 21 15 14   Temp:       TempSrc:      SpO2: 99% 100% 100% 100%  Weight:      Height:      PainSc:   Asleep     Isolation Precautions No active isolations  Medications Medications  acetaminophen (TYLENOL) tablet 1,000 mg (has no administration in time range)  losartan (COZAAR) tablet 50 mg (has no administration in time range)  metoprolol tartrate (LOPRESSOR) tablet 12.5 mg (has no administration in time range)  rosuvastatin (CRESTOR) tablet 20 mg (has no administration in time range)  amiodarone (PACERONE) tablet 200 mg (has no administration in time range)  pantoprazole (PROTONIX) EC tablet 40 mg (has no administration in time range)  apixaban (ELIQUIS) tablet 5 mg (has no administration in time range)  Folivane-Plus CAPS 1 capsule (has no administration in time range)  multivitamin with minerals tablet 1 tablet (has no administration in time range)  albuterol (PROVENTIL) (2.5 MG/3ML) 0.083% nebulizer solution 3 mL (has no administration in time range)  loratadine (CLARITIN) tablet 10 mg (has no administration in time range)  clonazepam (KLONOPIN) disintegrating tablet 0.25 mg (has no administration in time range)  acetaminophen (TYLENOL) tablet 650 mg (has no administration in time range)  alum & mag hydroxide-simeth (MAALOX/MYLANTA) 200-200-20 MG/5ML suspension 30 mL (has no administration in time range)  alum & mag hydroxide-simeth (MAALOX/MYLANTA) 200-200-20 MG/5ML suspension 30 mL (30 mLs Oral Given 12/07/22 1628)  famotidine (PEPCID) tablet 20 mg (20 mg Oral Given 12/07/22 1629)  acetaminophen (TYLENOL) tablet 1,000 mg (1,000 mg Oral Given 12/07/22 1628)    Mobility walks     Focused Assessments Cardiac Assessment Handoff:  Cardiac Rhythm: Sinus tachycardia No results found for: "CKTOTAL", "CKMB", "CKMBINDEX", "TROPONINI" Lab Results  Component Value Date   DDIMER <0.27 12/07/2022   Does the Patient currently have chest pain? No    R Recommendations: See Admitting Provider Note  Report  given to:   Additional Notes: A&O x 4.

## 2022-12-08 NOTE — Plan of Care (Signed)

## 2022-12-08 NOTE — Discharge Summary (Signed)
Physician Discharge Summary  Julie Rice HFW:[id number] DOB: 1936-07-12 DOA: 12/07/2022  PCP: Shirline Frees, NP  Admit date: 12/07/2022  Discharge date: 12/08/2022  Admitted From:Home  Disposition:  Home  Recommendations for Outpatient Follow-up:  Follow up with PCP in 1-2 weeks Follow up with GI, Dr. [name], outpatient for evaluation of esophageal strictures which may the culprit behind her chest pain Continue home medications as prior  Home Health:None  Equipment/Devices:None  Discharge Condition:Stable  CODE STATUS: Full  Diet recommendation: Heart Healthy  Brief/Interim Summary:   Julie Rice is a 87 y.o. female with medical history significant of  hypertension, rheumatoid arthritis, chronic diastolic CHF, non obstructive CAD COPD, chronic respiratory failure on 2.5 L at night, esophageal strictures/GERD, hypothyroidism, chronic back pain , hyperlipidemia,CVA(dx 09/27/22), SVT/PAF on Eliquis. Patient also has interim history of hospitalization 1 mo ago for NSTEMI for which she was discharged to rehab and now home x 2 weeks. Per patient who presents from home with complaint of intermittent central chest pain. Her troponin levels have remained flat and echocardiogram with no new WMAs noted as reported below. It is very likely that her chest pressure and discomfort is related to chronic dysphagia from her esophageal strictures which should be re-evaluated outpatient with her gastroenterologist. She is otherwise stable for discharge.  Discharge Diagnoses:  Principal Problem:   Chest pain  Principal discharge diagnosis: Atypical chest pain secondary to dysphagia/esophageal strictures.  Discharge Instructions  Discharge Instructions     Ambulatory referral to Gastroenterology   Complete by: As directed    Esophageal stricture evaluation   What is the reason for referral?: Other   Diet - low sodium heart healthy   Complete by: As directed    Increase activity slowly    Complete by: As directed       Allergies as of 12/08/2022       Reactions   Tape Other (See Comments)   Band aides, adhesive tape Redness and pulls skin off   Tramadol Other (See Comments)   Pt has prolonged Qtc interval of 540- cannot give tramadol per pharmacy   Morphine Itching, Rash   Flushing        Medication List     TAKE these medications    acetaminophen 500 MG tablet Commonly known as: TYLENOL Take 1,000 mg by mouth every 6 (six) hours as needed for moderate pain, headache or fever.   albuterol 108 (90 Base) MCG/ACT inhaler Commonly known as: VENTOLIN HFA TAKE 2 PUFFS BY MOUTH EVERY 6 HOURS AS NEEDED FOR WHEEZE OR SHORTNESS OF BREATH What changed: See the new instructions.   amiodarone 200 MG tablet Commonly known as: PACERONE Take 1 tablet (200 mg total) by mouth daily.   buPROPion 300 MG 24 hr tablet Commonly known as: WELLBUTRIN XL TAKE 1 TABLET BY MOUTH EVERY DAY   cetirizine 10 MG tablet Commonly known as: ZYRTEC Take 10 mg by mouth daily as needed for allergies.   clonazePAM 0.5 MG tablet Commonly known as: KLONOPIN Take 0.5 tablets (0.25 mg total) by mouth 2 (two) times daily as needed for anxiety.   DULoxetine 60 MG capsule Commonly known as: CYMBALTA TAKE 1 CAPSULE BY MOUTH EVERY DAY What changed: how much to take   Eliquis 5 MG Tabs tablet Generic drug: apixaban TAKE 1 TABLET BY MOUTH TWICE A DAY What changed: how much to take   Folivane-Plus Caps TAKE 1 CAPSULE BY MOUTH EVERY DAY IN THE MORNING What changed: See the new instructions.  Klor-Con M20 20 MEQ tablet Generic drug: potassium chloride SA TAKE 1 TABLET BY MOUTH EVERY DAY   losartan 50 MG tablet Commonly known as: COZAAR TAKE 1 TABLET BY MOUTH EVERY DAY   metoprolol tartrate 25 MG tablet Commonly known as: LOPRESSOR Take 0.5 tablets (12.5 mg total) by mouth 2 (two) times daily.   omeprazole 20 MG capsule Commonly known as: PRILOSEC Take 20 mg by mouth daily as  needed (for heartburn).   One-A-Day Womens 50+ Tabs Take 1 tablet by mouth daily.   REFRESH TEARS OP Place 1 drop into both eyes 2 (two) times daily.   rosuvastatin 20 MG tablet Commonly known as: CRESTOR Take 1 tablet (20 mg total) by mouth daily at 6 PM.   sodium chloride 0.65 % Soln nasal spray Commonly known as: OCEAN Place 1 spray into both nostrils as needed for congestion.        Follow-up Information     [name], NP. Schedule an appointment as soon as possible for a visit in 1 week(s).   Specialty: Family Medicine Contact information: [address] [place] [address] [phone]                Allergies  Allergen Reactions   Tape Other (See Comments)    Band aides, adhesive tape Redness and pulls skin off   Tramadol Other (See Comments)    Pt has prolonged Qtc interval of 540- cannot give tramadol per pharmacy   Morphine Itching and Rash    Flushing    Consultations: None   Procedures/Studies: ECHOCARDIOGRAM COMPLETE  Result Date: 12/08/2022    ECHOCARDIOGRAM REPORT   Patient Name:   Julie Rice Date of Exam: 12/08/2022 Medical Rec #:  [id number]        Height:       63.0 in Accession #:    [id number]       Weight:       144.3 lb Date of Birth:  26-Oct-1936         BSA:          1.683 m Patient Age:    49 years         BP:           118/70 mmHg Patient Gender: F                HR:           90 bpm. Exam Location:  Inpatient Procedure: 2D Echo, Cardiac Doppler and Color Doppler Indications:    R07.9* Chest pain, unspecified  History:        Patient has prior history of Echocardiogram examinations, most                 recent 10/21/2022. CAD, COPD and Carotid Disease,                 Arrythmias:Atrial Fibrillation, Signs/Symptoms:Dyspnea; Risk                 Factors:Hypertension and Dyslipidemia.  Sonographer:    Wilkie Aye RVT RCS Referring Phys: [id number] Royanne Foots [facility]  Sonographer Comments: Technically challenging study due to limited  acoustic windows, Technically difficult study due to poor echo windows, suboptimal parasternal window, suboptimal apical window and suboptimal subcostal window. IMPRESSIONS  1. Left ventricular ejection fraction, by estimation, is 70 to 75%. Left ventricular ejection fraction by PLAX is 75 %. The left ventricle has hyperdynamic function. The left ventricle has no regional wall motion  abnormalities. There is mild left ventricular hypertrophy. Left ventricular diastolic parameters are consistent with Grade I diastolic dysfunction (impaired relaxation).  2. Right ventricular systolic function is normal. The right ventricular size is normal. There is normal pulmonary artery systolic pressure. The estimated right ventricular systolic pressure is 15.1 mmHg.  3. The mitral valve is abnormal. Trivial mitral valve regurgitation. Moderate to severe mitral annular calcification.  4. The aortic valve is tricuspid. Aortic valve regurgitation is trivial. Aortic valve sclerosis/calcification is present, without any evidence of aortic stenosis.  5. The inferior vena cava is normal in size with greater than 50% respiratory variability, suggesting right atrial pressure of 3 mmHg. Comparison(s): Changes from prior study are noted. 10/21/2022: LVEF 60-65%, grade 2 DD. FINDINGS  Left Ventricle: Left ventricular ejection fraction, by estimation, is 70 to 75%. Left ventricular ejection fraction by PLAX is 75 %. The left ventricle has hyperdynamic function. The left ventricle has no regional wall motion abnormalities. The left ventricular internal cavity size was normal in size. There is mild left ventricular hypertrophy. Left ventricular diastolic parameters are consistent with Grade I diastolic dysfunction (impaired relaxation). Indeterminate filling pressures. Right Ventricle: The right ventricular size is normal. No increase in right ventricular wall thickness. Right ventricular systolic function is normal. There is normal pulmonary  artery systolic pressure. The tricuspid regurgitant velocity is 1.70 m/s, and  with an assumed right atrial pressure of 3 mmHg, the estimated right ventricular systolic pressure is 76.1 mmHg. Left Atrium: Left atrial size was normal in size. Right Atrium: Right atrial size was normal in size. Pericardium: There is no evidence of pericardial effusion. Mitral Valve: The mitral valve is abnormal. Moderate to severe mitral annular calcification. Trivial mitral valve regurgitation. Tricuspid Valve: The tricuspid valve is grossly normal. Tricuspid valve regurgitation is trivial. Aortic Valve: The aortic valve is tricuspid. Aortic valve regurgitation is trivial. Aortic valve sclerosis/calcification is present, without any evidence of aortic stenosis. Aortic valve mean gradient measures 4.0 mmHg. Aortic valve peak gradient measures 5.9 mmHg. Aortic valve area, by VTI measures 2.12 cm. Pulmonic Valve: The pulmonic valve was not well visualized. Pulmonic valve regurgitation is not visualized. Aorta: The aortic root and ascending aorta are structurally normal, with no evidence of dilitation. Venous: The inferior vena cava is normal in size with greater than 50% respiratory variability, suggesting right atrial pressure of 3 mmHg. IAS/Shunts: No atrial level shunt detected by color flow Doppler.  LEFT VENTRICLE PLAX 2D LV EF:         Left ventricular ejection fraction by PLAX is 75 %. LVIDd:         3.70 cm LVIDs:         2.10 cm LV PW:         1.20 cm LV IVS:        1.00 cm LVOT diam:     1.80 cm LV SV:         52 LV SV Index:   31 LVOT Area:     2.54 cm  RIGHT VENTRICLE             IVC RV Basal diam:  3.60 cm     IVC diam: 1.70 cm RV S prime:     16.20 cm/s TAPSE (M-mode): 2.5 cm LEFT ATRIUM           Index        RIGHT ATRIUM           Index LA diam:  3.70 cm 2.20 cm/m   RA Area:     14.70 cm LA Vol (A2C): 47.5 ml 28.22 ml/m  RA Volume:   34.80 ml  20.68 ml/m  AORTIC VALVE AV Area (Vmax):    2.21 cm AV Area  (Vmean):   1.92 cm AV Area (VTI):     2.12 cm AV Vmax:           121.00 cm/s AV Vmean:          91.900 cm/s AV VTI:            0.244 m AV Peak Grad:      5.9 mmHg AV Mean Grad:      4.0 mmHg LVOT Vmax:         105.00 cm/s LVOT Vmean:        69.200 cm/s LVOT VTI:          0.203 m LVOT/AV VTI ratio: 0.83  AORTA Ao Root diam: 3.00 cm Ao Asc diam:  3.40 cm MITRAL VALVE                TRICUSPID VALVE MV Area (PHT): 2.14 cm     TR Peak grad:   11.6 mmHg MV Decel Time: 354 msec     TR Vmax:        170.00 cm/s MV E velocity: 74.20 cm/s MV A velocity: 127.00 cm/s  SHUNTS MV E/A ratio:  0.58         Systemic VTI:  0.20 m                             Systemic Diam: 1.80 cm Zoila Shutter MD Electronically signed by Zoila Shutter MD Signature Date/Time: 12/08/2022/4:33:42 PM    Final    DG Chest Port 1 View  Result Date: 12/07/2022 CLINICAL DATA:  Shortness of breath, chest pain EXAM: PORTABLE CHEST 1 VIEW COMPARISON:  09/22/2022 FINDINGS: Atherosclerotic calcification of the aortic arch. Levoconvex lower thoracic scoliosis. Currently the heart size is within normal limits. The lungs appear clear. No blunting of the costophrenic angles (with noted exclusion of the tip of the left costophrenic angle from imaging). IMPRESSION: 1. No active cardiopulmonary disease is radiographically apparent. 2. Levoconvex lower thoracic scoliosis. 3.  Aortic Atherosclerosis (ICD10-I70.0). Electronically Signed   By: Gaylyn Rong M.D.   On: 12/07/2022 17:00     Discharge Exam: Vitals:   12/08/22 0736 12/08/22 1605  BP: (!) 154/72 (!) 147/65  Pulse: 81 85  Resp: 16 17  Temp: 97.8 F (36.6 C)   SpO2: 100% 97%   Vitals:   12/08/22 0130 12/08/22 0226 12/08/22 0736 12/08/22 1605  BP: (!) 155/76 (!) 125/58 (!) 154/72 (!) 147/65  Pulse: 87 97 81 85  Resp: 15 16 16 17   Temp: 97.7 F (36.5 C) 98 F (36.7 C) 97.8 F (36.6 C)   TempSrc: Oral Oral Oral   SpO2: 100% 100% 100% 97%  Weight:  65.5 kg    Height:  5\' 3"  (1.6 m)       General: Pt is alert, awake, not in acute distress Cardiovascular: RRR, S1/S2 +, no rubs, no gallops Respiratory: CTA bilaterally, no wheezing, no rhonchi Abdominal: Soft, NT, ND, bowel sounds + Extremities: no edema, no cyanosis    The results of significant diagnostics from this hospitalization (including imaging, microbiology, ancillary and laboratory) are listed below for reference.     Microbiology: Recent Results (from the past 240  hour(s))  Resp panel by RT-PCR (RSV, Flu A&B, Covid) Anterior Nasal Swab     Status: None   Collection Time: 12/07/22  6:08 PM   Specimen: Anterior Nasal Swab  Result Value Ref Range Status   SARS Coronavirus 2 by RT PCR NEGATIVE NEGATIVE Final   Influenza A by PCR NEGATIVE NEGATIVE Final   Influenza B by PCR NEGATIVE NEGATIVE Final    Comment: (NOTE) The Xpert Xpress SARS-CoV-2/FLU/RSV plus assay is intended as an aid in the diagnosis of influenza from Nasopharyngeal swab specimens and should not be used as a sole basis for treatment. Nasal washings and aspirates are unacceptable for Xpert Xpress SARS-CoV-2/FLU/RSV testing.  Fact Sheet for Patients: EntrepreneurPulse.com.au  Fact Sheet for Healthcare Providers: IncredibleEmployment.be  This test is not yet approved or cleared by the [place] FDA and has been authorized for detection and/or diagnosis of SARS-CoV-2 by FDA under an Emergency Use Authorization (EUA). This EUA will remain in effect (meaning this test can be used) for the duration of the COVID-19 declaration under Section 564(b)(1) of the Act, 21 U.S.C. section 360bbb-3(b)(1), unless the authorization is terminated or revoked.     Resp Syncytial Virus by PCR NEGATIVE NEGATIVE Final    Comment: (NOTE) Fact Sheet for Patients: EntrepreneurPulse.com.au  Fact Sheet for Healthcare Providers: IncredibleEmployment.be  This test is not yet approved  or cleared by the [place] FDA and has been authorized for detection and/or diagnosis of SARS-CoV-2 by FDA under an Emergency Use Authorization (EUA). This EUA will remain in effect (meaning this test can be used) for the duration of the COVID-19 declaration under Section 564(b)(1) of the Act, 21 U.S.C. section 360bbb-3(b)(1), unless the authorization is terminated or revoked.  Performed at [facility], [place] [address] [place], [place] [address]      Labs: BNP (last 3 results) Recent Labs    02/18/22 1217 09/22/22 1730 12/07/22 1603  BNP 1,547.1* 438.0* 034.7*   Basic Metabolic Panel: Recent Labs  Lab 12/07/22 1603  NA 136  K 3.7  CL 100  CO2 24  GLUCOSE 91  BUN 13  CREATININE 0.97  CALCIUM 9.4   Liver Function Tests: Recent Labs  Lab 12/07/22 1603  AST 21  ALT 16  ALKPHOS 60  BILITOT 0.6  PROT 6.2*  ALBUMIN 3.5   No results for input(s): "LIPASE", "AMYLASE" in the last 168 hours. No results for input(s): "AMMONIA" in the last 168 hours. CBC: Recent Labs  Lab 12/07/22 1603  WBC 10.9*  HGB 12.0  HCT 36.4  MCV 93.3  PLT 284   Cardiac Enzymes: No results for input(s): "CKTOTAL", "CKMB", "CKMBINDEX", "TROPONINI" in the last 168 hours. BNP: Invalid input(s): "POCBNP" CBG: No results for input(s): "GLUCAP" in the last 168 hours. D-Dimer Recent Labs    12/07/22 1800  DDIMER <0.27   Hgb A1c No results for input(s): "HGBA1C" in the last 72 hours. Lipid Profile No results for input(s): "CHOL", "HDL", "LDLCALC", "TRIG", "CHOLHDL", "LDLDIRECT" in the last 72 hours. Thyroid function studies No results for input(s): "TSH", "T4TOTAL", "T3FREE", "THYROIDAB" in the last 72 hours.  Invalid input(s): "FREET3" Anemia work up No results for input(s): "VITAMINB12", "FOLATE", "FERRITIN", "TIBC", "IRON", "RETICCTPCT" in the last 72 hours. Urinalysis    Component Value Date/Time   COLORURINE YELLOW 10/16/2021 1406   APPEARANCEUR Cloudy (A)  10/16/2021 1406   LABSPEC 1.020 10/16/2021 1406   PHURINE 5.5 10/16/2021 1406   GLUCOSEU NEGATIVE 10/16/2021 1406   HGBUR TRACE-INTACT (A) 10/16/2021  1406   HGBUR negative 07/19/2009 1509   BILIRUBINUR NEGATIVE 10/16/2021 1406   BILIRUBINUR neg 04/12/2020 1056   KETONESUR TRACE (A) 10/16/2021 1406   PROTEINUR Positive (A) 04/12/2020 1056   PROTEINUR NEGATIVE 08/17/2019 0457   UROBILINOGEN 0.2 10/16/2021 1406   NITRITE NEGATIVE 10/16/2021 1406   LEUKOCYTESUR MODERATE (A) 10/16/2021 1406   Sepsis Labs Recent Labs  Lab 12/07/22 1603  WBC 10.9*   Microbiology Recent Results (from the past 240 hour(s))  Resp panel by RT-PCR (RSV, Flu A&B, Covid) Anterior Nasal Swab     Status: None   Collection Time: 12/07/22  6:08 PM   Specimen: Anterior Nasal Swab  Result Value Ref Range Status   SARS Coronavirus 2 by RT PCR NEGATIVE NEGATIVE Final   Influenza A by PCR NEGATIVE NEGATIVE Final   Influenza B by PCR NEGATIVE NEGATIVE Final    Comment: (NOTE) The Xpert Xpress SARS-CoV-2/FLU/RSV plus assay is intended as an aid in the diagnosis of influenza from Nasopharyngeal swab specimens and should not be used as a sole basis for treatment. Nasal washings and aspirates are unacceptable for Xpert Xpress SARS-CoV-2/FLU/RSV testing.  Fact Sheet for Patients: BloggerCourse.com  Fact Sheet for Healthcare Providers: SeriousBroker.it  This test is not yet approved or cleared by the [place] FDA and has been authorized for detection and/or diagnosis of SARS-CoV-2 by FDA under an Emergency Use Authorization (EUA). This EUA will remain in effect (meaning this test can be used) for the duration of the COVID-19 declaration under Section 564(b)(1) of the Act, 21 U.S.C. section 360bbb-3(b)(1), unless the authorization is terminated or revoked.     Resp Syncytial Virus by PCR NEGATIVE NEGATIVE Final    Comment: (NOTE) Fact Sheet for  Patients: BloggerCourse.com  Fact Sheet for Healthcare Providers: SeriousBroker.it  This test is not yet approved or cleared by the [place] FDA and has been authorized for detection and/or diagnosis of SARS-CoV-2 by FDA under an Emergency Use Authorization (EUA). This EUA will remain in effect (meaning this test can be used) for the duration of the COVID-19 declaration under Section 564(b)(1) of the Act, 21 U.S.C. section 360bbb-3(b)(1), unless the authorization is terminated or revoked.  Performed at [facility], [address], [place], [place] [address]      Time coordinating discharge: 35 minutes  SIGNED:   Erick Blinks, DO [facility] 12/08/2022, 5:10 PM  If 7PM-7AM, please contact night-coverage [email or web address]

## 2022-12-09 NOTE — Consult Note (Signed)
   [facility] CM Inpatient Consult   12/09/2022  ENYAH MOMAN 14-Jul-1936 [id number]  [employer] [[employer]O] Patient: Julie Rice  Primary Care Provider:  [name], NP is listed with [place] [facility] which is listed to provide the transition of care follow up call.   Patient screened for hospitalization with noted observation status and to assess for potential [place] Management service needs for post hospital transition for care coordination.  Review of patient's electronic medical record reveals patient is for home. Discussed for home with home health in unit progression meeting. Plan:  Met with patient at the bedside, getting ready for home.  She endorses PCP and follow up, she states no needs for care coordination however, is wanting to start back with home health.  Inpatient TOC RNCM is aware of potential home health.  Of note, [facility] Care Management/Population Health does not replace or interfere with any arrangements made by the Inpatient Transition of Care team.  For questions contact:   Natividad Brood, RN BSN [place]  [phone] business mobile phone Toll free office [phone]  *[place]  [phone] Fax number: [phone] [email or web address]

## 2022-12-09 NOTE — TOC Transition Note (Signed)
Transition of Care [facility]) - CM/SW Discharge Note   Patient Details  Name: Julie Rice MRN: [id number] Date of Birth: August 08, 1936  Transition of Care [facility]) CM/SW Contact:  Zenon Mayo, RN Phone Number: 12/09/2022, 10:52 AM   Clinical Narrative:    Patient is for dc today, she states her SIL will transport her home and bring her some clothes.  NCM left vm for Ronalee Belts for return call.  Patient states she has walker, and oxygen at home, her oxygen she wears at night only 2.5 liters with [employer].  NCM offered choice with Medicare. Gov , she chose [facility].  NCM made referral to [facility] with [facility]. He is able to take referral.  Soc will begin 24 to 48 hrs post dc.     Final next level of care: Home w Home Health Services Barriers to Discharge: No Barriers Identified   Patient Goals and CMS Choice CMS Medicare.gov Compare Post Acute Care list provided to:: Patient Choice offered to / list presented to : Patient  Discharge Placement                         Discharge Plan and Services Additional resources added to the After Visit Summary for                  DME Arranged: N/A         HH Arranged: PT, OT HH Agency: [facility] Date [facility] Contacted: 12/09/22 Time [place]: [id number] Representative spoke with at [place]: [place] Determinants of Health ([place]) Interventions SDOH Screenings   Food Insecurity: No Food Insecurity (12/08/2022)  Housing: Low Risk  (12/08/2022)  Transportation Needs: No Transportation Needs (12/08/2022)  Utilities: Not At Risk (12/08/2022)  Alcohol Screen: Low Risk  (02/14/2022)  Depression (PHQ2-9): Medium Risk (07/30/2022)  Financial Resource Strain: Low Risk  (02/14/2022)  Physical Activity: Inactive (02/14/2022)  Social Connections: Moderately Integrated (02/14/2022)  Stress: No Stress Concern Present (02/14/2022)  Tobacco Use: Low Risk  (12/07/2022)     Readmission Risk Interventions     No data to display

## 2022-12-09 NOTE — Progress Notes (Signed)
Patient seen and evaluated this morning with no new complaints or concerns noted.  She continues to have some mild chest pressure and difficulty with swallowing which is related to her chronic dysphagia/esophageal strictures.  Please refer to discharge summary as dictated 2/4 for full details regarding the course of hospitalization.  She had refused to leave on 2/4 due to it being late in the evening as she lives alone.  She was otherwise medically stable for discharge on that day.  It has been explained to her that she will need to follow-up with her primary gastroenterologist Dr. [name] in the near future for further evaluation of her chronic dysphagia and strictures.  She voices understanding.  She is requesting home health PT/OT to assist her with her weakness and balance at home and refuses to go to SNF if needed.  She has home walker.  Total care time: 25 minutes.

## 2022-12-09 NOTE — Progress Notes (Signed)
Mobility Specialist - Progress Note   12/09/22 0900  Mobility  Activity Ambulated with assistance in room  Level of Assistance Contact guard assist, steadying assist  Assistive Device Front wheel walker  Distance Ambulated (ft) 60 ft  Activity Response Tolerated well  Mobility Referral Yes  $Mobility charge 1 Mobility    Pt received in bed agreeable to mobility. Deferred hallway ambulation d/t c/o "feeling shaky" and SOB. Left in bed w/ call bell in reach and all needs met.   [place] Specialist Please contact via SecureChat or Rehab office at [phone]

## 2022-12-10 DIAGNOSIS — I2510 Atherosclerotic heart disease of native coronary artery without angina pectoris: Secondary | ICD-10-CM | POA: Diagnosis not present

## 2022-12-10 DIAGNOSIS — I480 Paroxysmal atrial fibrillation: Secondary | ICD-10-CM | POA: Diagnosis not present

## 2022-12-10 DIAGNOSIS — I700 Atherosclerosis of aorta: Secondary | ICD-10-CM | POA: Diagnosis not present

## 2022-12-10 DIAGNOSIS — R1310 Dysphagia, unspecified: Secondary | ICD-10-CM | POA: Diagnosis not present

## 2022-12-10 DIAGNOSIS — I110 Hypertensive heart disease with heart failure: Secondary | ICD-10-CM | POA: Diagnosis not present

## 2022-12-10 DIAGNOSIS — J4489 Other specified chronic obstructive pulmonary disease: Secondary | ICD-10-CM | POA: Diagnosis not present

## 2022-12-10 DIAGNOSIS — K222 Esophageal obstruction: Secondary | ICD-10-CM | POA: Diagnosis not present

## 2022-12-10 DIAGNOSIS — I083 Combined rheumatic disorders of mitral, aortic and tricuspid valves: Secondary | ICD-10-CM | POA: Diagnosis not present

## 2022-12-10 DIAGNOSIS — M1990 Unspecified osteoarthritis, unspecified site: Secondary | ICD-10-CM | POA: Diagnosis not present

## 2022-12-10 DIAGNOSIS — I471 Supraventricular tachycardia, unspecified: Secondary | ICD-10-CM | POA: Diagnosis not present

## 2022-12-10 DIAGNOSIS — M0609 Rheumatoid arthritis without rheumatoid factor, multiple sites: Secondary | ICD-10-CM | POA: Diagnosis not present

## 2022-12-10 DIAGNOSIS — I5032 Chronic diastolic (congestive) heart failure: Secondary | ICD-10-CM | POA: Diagnosis not present

## 2022-12-10 DIAGNOSIS — I6529 Occlusion and stenosis of unspecified carotid artery: Secondary | ICD-10-CM | POA: Diagnosis not present

## 2022-12-10 DIAGNOSIS — G609 Hereditary and idiopathic neuropathy, unspecified: Secondary | ICD-10-CM | POA: Diagnosis not present

## 2022-12-10 DIAGNOSIS — J9621 Acute and chronic respiratory failure with hypoxia: Secondary | ICD-10-CM | POA: Diagnosis not present

## 2022-12-10 DIAGNOSIS — I429 Cardiomyopathy, unspecified: Secondary | ICD-10-CM | POA: Diagnosis not present

## 2022-12-13 ENCOUNTER — Other Ambulatory Visit: Payer: Self-pay | Admitting: Adult Health

## 2022-12-13 ENCOUNTER — Telehealth: Payer: Self-pay | Admitting: Adult Health

## 2022-12-13 ENCOUNTER — Ambulatory Visit (INDEPENDENT_AMBULATORY_CARE_PROVIDER_SITE_OTHER): Payer: Medicare Other | Admitting: Adult Health

## 2022-12-13 VITALS — BP 134/82 | HR 74 | Temp 98.2°F

## 2022-12-13 DIAGNOSIS — I083 Combined rheumatic disorders of mitral, aortic and tricuspid valves: Secondary | ICD-10-CM | POA: Diagnosis not present

## 2022-12-13 DIAGNOSIS — I6529 Occlusion and stenosis of unspecified carotid artery: Secondary | ICD-10-CM | POA: Diagnosis not present

## 2022-12-13 DIAGNOSIS — I429 Cardiomyopathy, unspecified: Secondary | ICD-10-CM | POA: Diagnosis not present

## 2022-12-13 DIAGNOSIS — E782 Mixed hyperlipidemia: Secondary | ICD-10-CM

## 2022-12-13 DIAGNOSIS — I10 Essential (primary) hypertension: Secondary | ICD-10-CM

## 2022-12-13 DIAGNOSIS — I471 Supraventricular tachycardia, unspecified: Secondary | ICD-10-CM

## 2022-12-13 DIAGNOSIS — I5032 Chronic diastolic (congestive) heart failure: Secondary | ICD-10-CM | POA: Diagnosis not present

## 2022-12-13 DIAGNOSIS — R079 Chest pain, unspecified: Secondary | ICD-10-CM

## 2022-12-13 DIAGNOSIS — I5022 Chronic systolic (congestive) heart failure: Secondary | ICD-10-CM

## 2022-12-13 DIAGNOSIS — R339 Retention of urine, unspecified: Secondary | ICD-10-CM

## 2022-12-13 DIAGNOSIS — M549 Dorsalgia, unspecified: Secondary | ICD-10-CM

## 2022-12-13 DIAGNOSIS — G609 Hereditary and idiopathic neuropathy, unspecified: Secondary | ICD-10-CM | POA: Diagnosis not present

## 2022-12-13 DIAGNOSIS — R1310 Dysphagia, unspecified: Secondary | ICD-10-CM | POA: Diagnosis not present

## 2022-12-13 DIAGNOSIS — G8929 Other chronic pain: Secondary | ICD-10-CM

## 2022-12-13 DIAGNOSIS — I480 Paroxysmal atrial fibrillation: Secondary | ICD-10-CM | POA: Diagnosis not present

## 2022-12-13 DIAGNOSIS — M069 Rheumatoid arthritis, unspecified: Secondary | ICD-10-CM

## 2022-12-13 DIAGNOSIS — K222 Esophageal obstruction: Secondary | ICD-10-CM

## 2022-12-13 DIAGNOSIS — J9621 Acute and chronic respiratory failure with hypoxia: Secondary | ICD-10-CM | POA: Diagnosis not present

## 2022-12-13 DIAGNOSIS — E038 Other specified hypothyroidism: Secondary | ICD-10-CM

## 2022-12-13 DIAGNOSIS — F419 Anxiety disorder, unspecified: Secondary | ICD-10-CM

## 2022-12-13 DIAGNOSIS — M1990 Unspecified osteoarthritis, unspecified site: Secondary | ICD-10-CM | POA: Diagnosis not present

## 2022-12-13 DIAGNOSIS — J449 Chronic obstructive pulmonary disease, unspecified: Secondary | ICD-10-CM

## 2022-12-13 DIAGNOSIS — Z8673 Personal history of transient ischemic attack (TIA), and cerebral infarction without residual deficits: Secondary | ICD-10-CM

## 2022-12-13 DIAGNOSIS — I2510 Atherosclerotic heart disease of native coronary artery without angina pectoris: Secondary | ICD-10-CM | POA: Diagnosis not present

## 2022-12-13 DIAGNOSIS — J4489 Other specified chronic obstructive pulmonary disease: Secondary | ICD-10-CM | POA: Diagnosis not present

## 2022-12-13 DIAGNOSIS — M0609 Rheumatoid arthritis without rheumatoid factor, multiple sites: Secondary | ICD-10-CM | POA: Diagnosis not present

## 2022-12-13 DIAGNOSIS — I700 Atherosclerosis of aorta: Secondary | ICD-10-CM | POA: Diagnosis not present

## 2022-12-13 DIAGNOSIS — I110 Hypertensive heart disease with heart failure: Secondary | ICD-10-CM | POA: Diagnosis not present

## 2022-12-13 DIAGNOSIS — G629 Polyneuropathy, unspecified: Secondary | ICD-10-CM

## 2022-12-13 LAB — CBC WITH DIFFERENTIAL/PLATELET
Basophils Absolute: 0.1 10*3/uL (ref 0.0–0.1)
Basophils Relative: 0.8 % (ref 0.0–3.0)
Eosinophils Absolute: 0.2 10*3/uL (ref 0.0–0.7)
Eosinophils Relative: 1.6 % (ref 0.0–5.0)
HCT: 31 % — ABNORMAL LOW (ref 36.0–46.0)
Hemoglobin: 10 g/dL — ABNORMAL LOW (ref 12.0–15.0)
Lymphocytes Relative: 9.3 % — ABNORMAL LOW (ref 12.0–46.0)
Lymphs Abs: 1.2 10*3/uL (ref 0.7–4.0)
MCHC: 32.3 g/dL (ref 30.0–36.0)
MCV: 94 fl (ref 78.0–100.0)
Monocytes Absolute: 0.7 10*3/uL (ref 0.1–1.0)
Monocytes Relative: 5.7 % (ref 3.0–12.0)
Neutro Abs: 10.4 10*3/uL — ABNORMAL HIGH (ref 1.4–7.7)
Neutrophils Relative %: 82.6 % — ABNORMAL HIGH (ref 43.0–77.0)
Platelets: 306 10*3/uL (ref 150.0–400.0)
RBC: 3.29 Mil/uL — ABNORMAL LOW (ref 3.87–5.11)
RDW: 15.6 % — ABNORMAL HIGH (ref 11.5–15.5)
WBC: 12.6 10*3/uL — ABNORMAL HIGH (ref 4.0–10.5)

## 2022-12-13 LAB — COMPREHENSIVE METABOLIC PANEL
ALT: 14 U/L (ref 0–35)
AST: 18 U/L (ref 0–37)
Albumin: 3.5 g/dL (ref 3.5–5.2)
Alkaline Phosphatase: 57 U/L (ref 39–117)
BUN: 18 mg/dL (ref 6–23)
CO2: 26 mEq/L (ref 19–32)
Calcium: 8.4 mg/dL (ref 8.4–10.5)
Chloride: 106 mEq/L (ref 96–112)
Creatinine, Ser: 0.87 mg/dL (ref 0.40–1.20)
GFR: 60.96 mL/min (ref >60.00)
Glucose, Bld: 84 mg/dL (ref 70–99)
Potassium: 5 mEq/L (ref 3.5–5.1)
Sodium: 139 mEq/L (ref 135–145)
Total Bilirubin: 0.3 mg/dL (ref 0.2–1.2)
Total Protein: 5.7 g/dL — ABNORMAL LOW (ref 6.0–8.3)

## 2022-12-13 LAB — URINALYSIS, ROUTINE W REFLEX MICROSCOPIC
Bilirubin Urine: NEGATIVE
Hgb urine dipstick: NEGATIVE
Ketones, ur: NEGATIVE
Nitrite: NEGATIVE
Specific Gravity, Urine: 1.015 (ref 1.000–1.030)
Total Protein, Urine: NEGATIVE
Urine Glucose: NEGATIVE
Urobilinogen, UA: 0.2 (ref 0.0–1.0)
pH: 6 (ref 5.0–8.0)

## 2022-12-13 MED ORDER — CLONAZEPAM 0.5 MG PO TABS: 0.5000 mg | ORAL_TABLET | Freq: Two times a day (BID) | ORAL | 2 refills | Status: DC

## 2022-12-13 MED ORDER — AMOXICILLIN-POT CLAVULANATE 875-125 MG PO TABS: 1.0000 | ORAL_TABLET | Freq: Two times a day (BID) | ORAL | 0 refills | Status: AC

## 2022-12-13 NOTE — Progress Notes (Signed)
Subjective:    Patient ID: Julie Rice, female    DOB: 04/30/1936, 87 y.o.   MRN: [id number]  HPI 87 year old female who  has a past medical history of A-fib ([place]), Anemia, Anxiety, Bipolar disorder ([place]), Blood transfusion (1991), CAD (coronary artery disease), Cataract, Chest pain, CHF (congestive heart failure) ([place]) (07/2020), Chronic back pain, COPD (chronic obstructive pulmonary disease) ([place]), Depression, Esophageal stricture, Fibromyalgia, GERD (gastroesophageal reflux disease), Heart murmur, Hiatal hernia, Hyperlipidemia, Hypertension, Internal hemorrhoids, Lumbago, Mitral regurgitation, Osteoarthritis, Osteoporosis, Pneumonia (~ 01/2018), PONV (postoperative nausea and vomiting), Rectal bleeding, Rheumatoid arthritis ([place]), Spondylosis, TIA (transient ischemic attack) (2013), and Urinary incontinence.  She presents to the office today for TCM visit   Admit Date 12/07/2022 Discharge Date 12/08/2022  She had a hospital admission roughly 1 month ago for NSTEMI, she was discharged to rehab and has been home for 2 weeks.  She presented from home with a complaint of intermittent central chest pain and shortness of breath x 1 week that had become worse over the previous 1 to 2 days.  She did not note any associated diaphoresis, presyncope, or palpitations.  She does endorse an episode of nausea and vomiting.  She noted that the pain was worse with walking and relieved with rest.  Chest pain also improved with Maalox  Hospital Course  Chest Pain  During her admission  her troponin levels remained flat and echocardiogram did not show any new WMA's.  It was thought that her chest pressure and discomfort was related to chronic dysphagia from her esophageal strictures which she was advised to follow-up outpatient with her gastroenterologist.  SVT/PAF -He was continued on amiodarone, metoprolol, and Eliquis.  EKG during admission showed sinus tachycardia  Hypertension  - Continued on home  regimen   Chronic CHF  -Continue on metoprolol and losartan.  She remained euvolemic during hospital admission  COPD/chronic respiratory failure on 2.5 L at night -Continued home inhalers  Esophageal strictures/GERD -Continue PPI  -Follow-up with gastroenterology as outpatient  Hypothyroidism -Continue with Synthroid  Chronic back Pain  - Continue with cconservative measures  Hyperlipidemia - continue statin   Hx of CVA  -Diagnosed November 2023.  Continue secondary precautions with Eliquis  Rheumatoid Arthritis  - no current flare - Continue with home regimen   Idopathic peripherial neuropathy  - Follow up with Neurology as directed  Today she reports that she continues to have episodes with dysphagia causing pain in her epigastric area and lower chest.  Her biggest complaint today is that of urinary issues.  She reports having a catheter while in the hospital due to not being able to urinate.  Prior to being discharged the catheter was removed.  For the first 2 days after being discharged while at home she could not urinate.  Days ago she started having "a little bit of urination" and this continues today.  She feels as though he can urinate a little bit but not everything is able to be eliminated.  She denies dysuria or hematuria.  She has not had any fevers or chills.  Review of Systems See HPI   Past Medical History:  Diagnosis Date   A-fib ([place])    Anemia    Anxiety    Bipolar disorder ([place])    Blood transfusion 1991   autologous pts own blood given    CAD (coronary artery disease)    Cataract    Chest pain    "@ rest, lying down, w/exertion"   CHF (  congestive heart failure) ([place]) 07/2020   Chronic back pain    "mostly lower back but I do have upper back pain regularly" (04/06/2018)   COPD (chronic obstructive pulmonary disease) ([facility])    Depression    Esophageal stricture    Fibromyalgia    GERD (gastroesophageal reflux disease)    Heart murmur    "slight"  (04/06/2018)   Hiatal hernia    Hyperlipidemia    Patient denies   Hypertension    Internal hemorrhoids    Lumbago    Mitral regurgitation    Osteoarthritis    Osteoporosis    Pneumonia ~ 01/2018   PONV (postoperative nausea and vomiting)    severe ponv, "in the past" (04/06/2018)   Rectal bleeding    Rheumatoid arthritis ([place])    Spondylosis    TIA (transient ischemic attack) 2013   Urinary incontinence    wears depends    Social History   Socioeconomic History   Marital status: Widowed    Spouse name: Not on file   Number of children: 2   Years of education: 39   Highest education level: 12th grade  Occupational History   Occupation: retired    Fish farm manager: RETIRED  Tobacco Use   Smoking status: Never   Smokeless tobacco: Never  Vaping Use   Vaping Use: Never used  Substance and Sexual Activity   Alcohol use: Never   Drug use: Never   Sexual activity: Not Currently    Comment: 1st intercourse 42 yo-1 partner  Other Topics Concern   Not on file  Social History Narrative   Retired    Widowed   Right handed   Social Determinants of Health   Financial Resource Strain: [place]  (02/14/2022)   Overall Financial Resource Strain (CARDIA)    Difficulty of Paying Living Expenses: Not hard at all  Food Insecurity: No Food Insecurity (12/08/2022)   Hunger Vital Sign    Worried About Running Out of Food in the Last Year: Never true    [place] in the Last Year: Never true  Transportation Needs: No Transportation Needs (12/08/2022)   PRAPARE - Hydrologist (Medical): No    Lack of Transportation (Non-Medical): No  Physical Activity: Inactive (02/14/2022)   Exercise Vital Sign    Days of Exercise per Week: 0 days    Minutes of Exercise per Session: 0 min  Stress: No Stress Concern Present (02/14/2022)   [place]    Feeling of Stress : Not at all  Social Connections:  Moderately Integrated (02/14/2022)   Social Connection and Isolation Panel [NHANES]    Frequency of Communication with Friends and Family: More than three times a week    Frequency of Social Gatherings with Friends and Family: More than three times a week    Attends Religious Services: More than 4 times per year    Active Member of [employer] or Organizations: Yes    Attends Archivist Meetings: More than 4 times per year    Marital Status: Widowed  Intimate Partner Violence: Not At Risk (12/08/2022)   Humiliation, Afraid, Rape, and Kick questionnaire    Fear of Current or Ex-Partner: No    Emotionally Abused: No    Physically Abused: No    Sexually Abused: No    Past Surgical History:  Procedure Laterality Date   ABDOMINAL HYSTERECTOMY  1982   BACK SURGERY  BALLOON DILATION N/A 09/21/2018   Procedure: BALLOON DILATION;  Surgeon: Irene Shipper, MD;  Location: [facility];  Service: Endoscopy;  Laterality: N/A;   BALLOON DILATION N/A 08/12/2019   Procedure: BALLOON DILATION;  Surgeon: Jackquline Denmark, MD;  Location[facility] ENDOSCOPY;  Service: Endoscopy;  Laterality: N/A;   BIOPSY  08/12/2019   Procedure: BIOPSY;  Surgeon: Jackquline Denmark, MD;  Location: [place];  Service: Endoscopy;;   BLADDER SUSPENSION  1980's   BOTOX INJECTION N/A 09/21/2018   Procedure: BOTOX INJECTION;  Surgeon: Irene Shipper, MD;  Location: [facility] ENDOSCOPY;  Service: Endoscopy;  Laterality: N/A;   BOTOX INJECTION N/A 08/12/2019   Procedure: BOTOX INJECTION;  Surgeon: Jackquline Denmark, MD;  Location: [facility] ENDOSCOPY;  Service: Endoscopy;  Laterality: N/A;   BOTOX INJECTION N/A 09/27/2022   Procedure: BOTOX INJECTION;  Surgeon: Doran Stabler, MD;  Location: [place];  Service: Gastroenterology;  Laterality: N/A;   CATARACT EXTRACTION W/ INTRAOCULAR LENS  IMPLANT, BILATERAL Bilateral 2010   DILATION AND CURETTAGE OF UTERUS  1961   ESOPHAGEAL MANOMETRY N/A 03/25/2018   Procedure: ESOPHAGEAL MANOMETRY (EM);   Surgeon: Mauri Pole, MD;  Location: [facility] ENDOSCOPY;  Service: Endoscopy;  Laterality: N/A;   ESOPHAGOGASTRODUODENOSCOPY (EGD) WITH PROPOFOL N/A 04/07/2018   Procedure: ESOPHAGOGASTRODUODENOSCOPY (EGD) WITH PROPOFOL;  Surgeon: Doran Stabler, MD;  Location: [place];  Service: Gastroenterology;  Laterality: N/A;   ESOPHAGOGASTRODUODENOSCOPY (EGD) WITH PROPOFOL N/A 09/21/2018   Procedure: ESOPHAGOGASTRODUODENOSCOPY (EGD) WITH PROPOFOL;  Surgeon: Irene Shipper, MD;  Location: [facility] ENDOS[facility];  Service: Endoscopy;  Laterality: N/A;   ESOPHAGOGASTRODUODENOSCOPY (EGD) WITH PROPOFOL N/A 08/12/2019   Procedure: ESOPHAGOGASTRODUODENOSCOPY (EGD) WITH PROPOFOL;  Surgeon: Jackquline Denmark, MD;  Location: [facility] ENDOSCOPY;  Service: Endoscopy;  Laterality: N/A;   ESOPHAGOGASTRODUODENOSCOPY (EGD) WITH PROPOFOL N/A 09/27/2022   Procedure: ESOPHAGOGASTRODUODENOSCOPY (EGD) WITH PROPOFOL;  Surgeon: Doran Stabler, MD;  Location: [place];  Service: Gastroenterology;  Laterality: N/A;   HERNIA REPAIR     HIATAL HERNIA REPAIR N/A 08/02/2016   Procedure: LAPAROSCOPIC REPAIR OF LARGE  HIATAL HERNIA;  Surgeon: Excell Seltzer, MD;  Location: [facility];  Service: General;  Laterality: N/A;   JOINT REPLACEMENT     LAPAROSCOPIC NISSEN FUNDOPLICATION N/A [id number]   Procedure: LAPAROSCOPIC NISSEN FUNDOPLICATION;  Surgeon: Excell Seltzer, MD;  Location: [facility];  Service: General;  Laterality: N/A;   [place]; [id number]   "I've got 2 stainless steel rods; 6 screws; 2 ray cages"took bone from right hip to put in back   RIGHT/LEFT HEART CATH AND CORONARY ANGIOGRAPHY N/A 07/26/2019   Procedure: RIGHT/LEFT HEART CATH AND CORONARY ANGIOGRAPHY;  Surgeon: Belva Crome, MD;  Location: [place] CV LAB;  Service: Cardiovascular;  Laterality: N/A;   SVT ABLATION N/A 04/25/2021   Procedure: SVT ABLATION;  Surgeon: Constance Haw, MD;  Location: [place] [facility];  Service: Cardiovascular;   Laterality: N/A;   TEE WITHOUT CARDIOVERSION N/A 08/19/2019   Procedure: TRANSESOPHAGEAL ECHOCARDIOGRAM (TEE);  Surgeon: Lelon Perla, MD;  Location: [facility] ENDOSCOPY;  Service: Cardiovascular;  Laterality: N/A;   TONSILLECTOMY AND ADENOIDECTOMY  1945   TOTAL KNEE ARTHROPLASTY Right ~ [place]  ~ 1976    Family History  Problem Relation Age of Onset   Kidney disease Mother    Hypertension Mother    Heart disease Father 51       die of MI at age 40   Heart disease Brother  Heart attack Brother    Suicidality Son    Other Son        MVA   Colon cancer Neg Hx    Esophageal cancer Neg Hx    Pancreatic cancer Neg Hx    Rectal cancer Neg Hx    Stomach cancer Neg Hx     Allergies  Allergen Reactions   Tape Other (See Comments)    Band aides, adhesive tape Redness and pulls skin off   Tramadol Other (See Comments)    Pt has prolonged Qtc interval of 540- cannot give tramadol per pharmacy   Morphine Itching and Rash    Flushing    Current Outpatient Medications on File Prior to Visit  Medication Sig Dispense Refill   acetaminophen (TYLENOL) 500 MG tablet Take 1,000 mg by mouth every 6 (six) hours as needed for moderate pain, headache or fever.     albuterol (VENTOLIN HFA) 108 (90 Base) MCG/ACT inhaler TAKE 2 PUFFS BY MOUTH EVERY 6 HOURS AS NEEDED FOR WHEEZE OR SHORTNESS OF BREATH (Patient taking differently: Inhale 2 puffs into the lungs every 6 (six) hours as needed for wheezing or shortness of breath.) 8.5 each 2   amiodarone (PACERONE) 200 MG tablet Take 1 tablet (200 mg total) by mouth daily.     buPROPion (WELLBUTRIN XL) 300 MG 24 hr tablet TAKE 1 TABLET BY MOUTH EVERY DAY 90 tablet 1   Carboxymethylcellulose Sodium (REFRESH TEARS OP) Place 1 drop into both eyes 2 (two) times daily.     cetirizine (ZYRTEC) 10 MG tablet Take 10 mg by mouth daily as needed for allergies.     DULoxetine (CYMBALTA) 60 MG capsule TAKE 1 CAPSULE BY MOUTH EVERY DAY (Patient taking  differently: Take 60 mg by mouth daily.) 90 capsule 3   ELIQUIS 5 MG TABS tablet TAKE 1 TABLET BY MOUTH TWICE A DAY (Patient taking differently: Take 5 mg by mouth 2 (two) times daily.) 180 tablet 1   FeFum-FePoly-FA-B Cmp-C-Biot (FOLIVANE-PLUS) CAPS TAKE 1 CAPSULE BY MOUTH EVERY DAY IN THE MORNING (Patient taking differently: Take 1 capsule by mouth daily.) 90 capsule 0   KLOR-CON M20 20 MEQ tablet TAKE 1 TABLET BY MOUTH EVERY DAY 90 tablet 2   losartan (COZAAR) 50 MG tablet TAKE 1 TABLET BY MOUTH EVERY DAY 90 tablet 3   metoprolol tartrate (LOPRESSOR) 25 MG tablet Take 0.5 tablets (12.5 mg total) by mouth 2 (two) times daily.     Multiple Vitamins-Minerals (ONE-A-DAY WOMENS 50+) TABS Take 1 tablet by mouth daily.     omeprazole (PRILOSEC) 20 MG capsule Take 20 mg by mouth daily as needed (for heartburn).     rosuvastatin (CRESTOR) 20 MG tablet Take 1 tablet (20 mg total) by mouth daily at 6 PM.     sodium chloride (OCEAN) 0.65 % SOLN nasal spray Place 1 spray into both nostrils as needed for congestion.     No current facility-administered medications on file prior to visit.    BP 134/82   Pulse 74   Temp 98.2 F (36.8 C)   SpO2 97%       Objective:   Physical Exam Vitals and nursing note reviewed.  Constitutional:      Appearance: Normal appearance.  Cardiovascular:     Rate and Rhythm: Normal rate and regular rhythm.     Pulses: Normal pulses.     Heart sounds: Normal heart sounds.  Pulmonary:     Effort: Pulmonary effort is normal.  Breath sounds: Normal breath sounds.  Abdominal:     General: Abdomen is flat. Bowel sounds are normal.     Palpations: Abdomen is soft.     Tenderness: There is abdominal tenderness in the periumbilical area and suprapubic area. There is no right CVA tenderness or left CVA tenderness.  Musculoskeletal:        General: Normal range of motion.  Skin:    General: Skin is warm and dry.  Neurological:     General: No focal deficit present.      Mental Status: She is alert and oriented to person, place, and time.  Psychiatric:        Mood and Affect: Mood normal.        Behavior: Behavior normal.        Thought Content: Thought content normal.        Judgment: Judgment normal.       Assessment & Plan:  1. Chest pain, unspecified type - Follow up with Urology as directed - CBC with Differential/Platelet; Future - Comprehensive metabolic panel; Future - Comprehensive metabolic panel - CBC with Differential/Platelet  2. PAF (paroxysmal atrial fibrillation) ([facility]) - Continue with Eliquis and Metoprolol  - CBC with Differential/Platelet; Future - Comprehensive metabolic panel; Future - Comprehensive metabolic panel - CBC with Differential/Platelet  3. Paroxysmal SVT (supraventricular tachycardia) - Continue with Eliquis and Metoprolol  - CBC with Differential/Platelet; Future - Comprehensive metabolic panel; Future - Comprehensive metabolic panel - CBC with Differential/Platelet  4. Essential hypertension - Controlled. No change in medications  - CBC with Differential/Platelet; Future - Comprehensive metabolic panel; Future - Comprehensive metabolic panel - CBC with Differential/Platelet  5. Chronic systolic congestive heart failure ([place]) - Per cardiology - CBC with Differential/Platelet; Future - Comprehensive metabolic panel; Future - Comprehensive metabolic panel - CBC with Differential/Platelet  6. Esophageal stricture - Per GI  - CBC with Differential/Platelet; Future - Comprehensive metabolic panel; Future - Comprehensive metabolic panel - CBC with Differential/Platelet  7. Chronic obstructive pulmonary disease, unspecified COPD type ([place]) - Per pulmonary. Continue with inhalers  - CBC with Differential/Platelet; Future - Comprehensive metabolic panel; Future - Comprehensive metabolic panel - CBC with Differential/Platelet  8. Other specified hypothyroidism - Continue with synthroid  - CBC  with Differential/Platelet; Future - Comprehensive metabolic panel; Future - Comprehensive metabolic panel - CBC with Differential/Platelet  9. Chronic right-sided back pain, unspecified back location - Continue with home medications  - CBC with Differential/Platelet; Future - Comprehensive metabolic panel; Future - Comprehensive metabolic panel - CBC with Differential/Platelet  10. Mixed hyperlipidemia - continue statin  - CBC with Differential/Platelet; Future - Comprehensive metabolic panel; Future - Comprehensive metabolic panel - CBC with Differential/Platelet  11. History of CVA (cerebrovascular accident) - continue with preventative measures  - CBC with Differential/Platelet; Future - Comprehensive metabolic panel; Future - Comprehensive metabolic panel - CBC with Differential/Platelet  12. Rheumatoid arthritis involving multiple sites, unspecified whether rheumatoid factor present ([place])  - CBC with Differential/Platelet; Future - Comprehensive metabolic panel; Future - Comprehensive metabolic panel - CBC with Differential/Platelet  13. Neuropathy - Per neurology  - CBC with Differential/Platelet; Future - Comprehensive metabolic panel; Future - Comprehensive metabolic panel - CBC with Differential/Platelet  14. Anxiety - Needs refill  - clonazePAM (KLONOPIN) 0.5 MG tablet; Take 1 tablet (0.5 mg total) by mouth 2 (two) times daily.  Dispense: 60 tablet; Refill: 2  15. Urinary retention - Will check UA today. If no infections then will refer to Urology  -  Urinalysis; Future - Urinalysis  [name], NP

## 2022-12-13 NOTE — Patient Instructions (Addendum)
I am going to check a urinalysis and some blood work on you   Please follow up with gastroenterologist

## 2022-12-13 NOTE — Telephone Encounter (Signed)
Updated patient on her labs.  Urinalysis shows that she possibly may have a urinary tract infection.  I am wondering if this is why she is having a hard time to urinate.  Will send in Augmentin.  She was advised to follow-up with me next week via MyChart to let me know how she is feeling and if she is urinating easier.

## 2022-12-16 DIAGNOSIS — I110 Hypertensive heart disease with heart failure: Secondary | ICD-10-CM | POA: Diagnosis not present

## 2022-12-16 DIAGNOSIS — J9621 Acute and chronic respiratory failure with hypoxia: Secondary | ICD-10-CM | POA: Diagnosis not present

## 2022-12-16 DIAGNOSIS — R1310 Dysphagia, unspecified: Secondary | ICD-10-CM | POA: Diagnosis not present

## 2022-12-16 DIAGNOSIS — M0609 Rheumatoid arthritis without rheumatoid factor, multiple sites: Secondary | ICD-10-CM | POA: Diagnosis not present

## 2022-12-16 DIAGNOSIS — I429 Cardiomyopathy, unspecified: Secondary | ICD-10-CM | POA: Diagnosis not present

## 2022-12-16 DIAGNOSIS — K222 Esophageal obstruction: Secondary | ICD-10-CM | POA: Diagnosis not present

## 2022-12-16 DIAGNOSIS — I2510 Atherosclerotic heart disease of native coronary artery without angina pectoris: Secondary | ICD-10-CM | POA: Diagnosis not present

## 2022-12-16 DIAGNOSIS — I480 Paroxysmal atrial fibrillation: Secondary | ICD-10-CM | POA: Diagnosis not present

## 2022-12-16 DIAGNOSIS — I5032 Chronic diastolic (congestive) heart failure: Secondary | ICD-10-CM | POA: Diagnosis not present

## 2022-12-16 DIAGNOSIS — M1990 Unspecified osteoarthritis, unspecified site: Secondary | ICD-10-CM | POA: Diagnosis not present

## 2022-12-16 DIAGNOSIS — I6529 Occlusion and stenosis of unspecified carotid artery: Secondary | ICD-10-CM | POA: Diagnosis not present

## 2022-12-16 DIAGNOSIS — I083 Combined rheumatic disorders of mitral, aortic and tricuspid valves: Secondary | ICD-10-CM | POA: Diagnosis not present

## 2022-12-16 DIAGNOSIS — J4489 Other specified chronic obstructive pulmonary disease: Secondary | ICD-10-CM | POA: Diagnosis not present

## 2022-12-16 DIAGNOSIS — G609 Hereditary and idiopathic neuropathy, unspecified: Secondary | ICD-10-CM | POA: Diagnosis not present

## 2022-12-16 DIAGNOSIS — I700 Atherosclerosis of aorta: Secondary | ICD-10-CM | POA: Diagnosis not present

## 2022-12-16 DIAGNOSIS — I471 Supraventricular tachycardia, unspecified: Secondary | ICD-10-CM | POA: Diagnosis not present

## 2022-12-17 DIAGNOSIS — I429 Cardiomyopathy, unspecified: Secondary | ICD-10-CM | POA: Diagnosis not present

## 2022-12-17 DIAGNOSIS — I5032 Chronic diastolic (congestive) heart failure: Secondary | ICD-10-CM | POA: Diagnosis not present

## 2022-12-17 DIAGNOSIS — I110 Hypertensive heart disease with heart failure: Secondary | ICD-10-CM | POA: Diagnosis not present

## 2022-12-17 DIAGNOSIS — I2510 Atherosclerotic heart disease of native coronary artery without angina pectoris: Secondary | ICD-10-CM | POA: Diagnosis not present

## 2022-12-17 DIAGNOSIS — I471 Supraventricular tachycardia, unspecified: Secondary | ICD-10-CM | POA: Diagnosis not present

## 2022-12-17 DIAGNOSIS — I083 Combined rheumatic disorders of mitral, aortic and tricuspid valves: Secondary | ICD-10-CM | POA: Diagnosis not present

## 2022-12-17 DIAGNOSIS — R1310 Dysphagia, unspecified: Secondary | ICD-10-CM | POA: Diagnosis not present

## 2022-12-17 DIAGNOSIS — I700 Atherosclerosis of aorta: Secondary | ICD-10-CM | POA: Diagnosis not present

## 2022-12-17 DIAGNOSIS — M1990 Unspecified osteoarthritis, unspecified site: Secondary | ICD-10-CM | POA: Diagnosis not present

## 2022-12-17 DIAGNOSIS — J4489 Other specified chronic obstructive pulmonary disease: Secondary | ICD-10-CM | POA: Diagnosis not present

## 2022-12-17 DIAGNOSIS — J9621 Acute and chronic respiratory failure with hypoxia: Secondary | ICD-10-CM | POA: Diagnosis not present

## 2022-12-17 DIAGNOSIS — I6529 Occlusion and stenosis of unspecified carotid artery: Secondary | ICD-10-CM | POA: Diagnosis not present

## 2022-12-17 DIAGNOSIS — M0609 Rheumatoid arthritis without rheumatoid factor, multiple sites: Secondary | ICD-10-CM | POA: Diagnosis not present

## 2022-12-17 DIAGNOSIS — G609 Hereditary and idiopathic neuropathy, unspecified: Secondary | ICD-10-CM | POA: Diagnosis not present

## 2022-12-17 DIAGNOSIS — I480 Paroxysmal atrial fibrillation: Secondary | ICD-10-CM | POA: Diagnosis not present

## 2022-12-17 DIAGNOSIS — K222 Esophageal obstruction: Secondary | ICD-10-CM | POA: Diagnosis not present

## 2022-12-19 DIAGNOSIS — I429 Cardiomyopathy, unspecified: Secondary | ICD-10-CM | POA: Diagnosis not present

## 2022-12-19 DIAGNOSIS — I700 Atherosclerosis of aorta: Secondary | ICD-10-CM | POA: Diagnosis not present

## 2022-12-19 DIAGNOSIS — J4489 Other specified chronic obstructive pulmonary disease: Secondary | ICD-10-CM | POA: Diagnosis not present

## 2022-12-19 DIAGNOSIS — K222 Esophageal obstruction: Secondary | ICD-10-CM | POA: Diagnosis not present

## 2022-12-19 DIAGNOSIS — I480 Paroxysmal atrial fibrillation: Secondary | ICD-10-CM | POA: Diagnosis not present

## 2022-12-19 DIAGNOSIS — I471 Supraventricular tachycardia, unspecified: Secondary | ICD-10-CM | POA: Diagnosis not present

## 2022-12-19 DIAGNOSIS — I083 Combined rheumatic disorders of mitral, aortic and tricuspid valves: Secondary | ICD-10-CM | POA: Diagnosis not present

## 2022-12-19 DIAGNOSIS — J9621 Acute and chronic respiratory failure with hypoxia: Secondary | ICD-10-CM | POA: Diagnosis not present

## 2022-12-19 DIAGNOSIS — I6529 Occlusion and stenosis of unspecified carotid artery: Secondary | ICD-10-CM | POA: Diagnosis not present

## 2022-12-19 DIAGNOSIS — I5032 Chronic diastolic (congestive) heart failure: Secondary | ICD-10-CM | POA: Diagnosis not present

## 2022-12-19 DIAGNOSIS — G609 Hereditary and idiopathic neuropathy, unspecified: Secondary | ICD-10-CM | POA: Diagnosis not present

## 2022-12-19 DIAGNOSIS — M1990 Unspecified osteoarthritis, unspecified site: Secondary | ICD-10-CM | POA: Diagnosis not present

## 2022-12-19 DIAGNOSIS — M0609 Rheumatoid arthritis without rheumatoid factor, multiple sites: Secondary | ICD-10-CM | POA: Diagnosis not present

## 2022-12-19 DIAGNOSIS — I110 Hypertensive heart disease with heart failure: Secondary | ICD-10-CM | POA: Diagnosis not present

## 2022-12-19 DIAGNOSIS — I2510 Atherosclerotic heart disease of native coronary artery without angina pectoris: Secondary | ICD-10-CM | POA: Diagnosis not present

## 2022-12-19 DIAGNOSIS — R1310 Dysphagia, unspecified: Secondary | ICD-10-CM | POA: Diagnosis not present

## 2022-12-20 DIAGNOSIS — K222 Esophageal obstruction: Secondary | ICD-10-CM | POA: Diagnosis not present

## 2022-12-20 DIAGNOSIS — G609 Hereditary and idiopathic neuropathy, unspecified: Secondary | ICD-10-CM | POA: Diagnosis not present

## 2022-12-20 DIAGNOSIS — I110 Hypertensive heart disease with heart failure: Secondary | ICD-10-CM | POA: Diagnosis not present

## 2022-12-20 DIAGNOSIS — I480 Paroxysmal atrial fibrillation: Secondary | ICD-10-CM | POA: Diagnosis not present

## 2022-12-20 DIAGNOSIS — I700 Atherosclerosis of aorta: Secondary | ICD-10-CM | POA: Diagnosis not present

## 2022-12-20 DIAGNOSIS — M1990 Unspecified osteoarthritis, unspecified site: Secondary | ICD-10-CM | POA: Diagnosis not present

## 2022-12-20 DIAGNOSIS — R1310 Dysphagia, unspecified: Secondary | ICD-10-CM | POA: Diagnosis not present

## 2022-12-20 DIAGNOSIS — J9621 Acute and chronic respiratory failure with hypoxia: Secondary | ICD-10-CM | POA: Diagnosis not present

## 2022-12-20 DIAGNOSIS — I429 Cardiomyopathy, unspecified: Secondary | ICD-10-CM | POA: Diagnosis not present

## 2022-12-20 DIAGNOSIS — I083 Combined rheumatic disorders of mitral, aortic and tricuspid valves: Secondary | ICD-10-CM | POA: Diagnosis not present

## 2022-12-20 DIAGNOSIS — M0609 Rheumatoid arthritis without rheumatoid factor, multiple sites: Secondary | ICD-10-CM | POA: Diagnosis not present

## 2022-12-20 DIAGNOSIS — I5032 Chronic diastolic (congestive) heart failure: Secondary | ICD-10-CM | POA: Diagnosis not present

## 2022-12-20 DIAGNOSIS — I471 Supraventricular tachycardia, unspecified: Secondary | ICD-10-CM | POA: Diagnosis not present

## 2022-12-20 DIAGNOSIS — I6529 Occlusion and stenosis of unspecified carotid artery: Secondary | ICD-10-CM | POA: Diagnosis not present

## 2022-12-20 DIAGNOSIS — J4489 Other specified chronic obstructive pulmonary disease: Secondary | ICD-10-CM | POA: Diagnosis not present

## 2022-12-20 DIAGNOSIS — I2510 Atherosclerotic heart disease of native coronary artery without angina pectoris: Secondary | ICD-10-CM | POA: Diagnosis not present

## 2022-12-23 DIAGNOSIS — I6529 Occlusion and stenosis of unspecified carotid artery: Secondary | ICD-10-CM | POA: Diagnosis not present

## 2022-12-23 DIAGNOSIS — I480 Paroxysmal atrial fibrillation: Secondary | ICD-10-CM | POA: Diagnosis not present

## 2022-12-23 DIAGNOSIS — I110 Hypertensive heart disease with heart failure: Secondary | ICD-10-CM | POA: Diagnosis not present

## 2022-12-23 DIAGNOSIS — I471 Supraventricular tachycardia, unspecified: Secondary | ICD-10-CM | POA: Diagnosis not present

## 2022-12-23 DIAGNOSIS — I700 Atherosclerosis of aorta: Secondary | ICD-10-CM | POA: Diagnosis not present

## 2022-12-23 DIAGNOSIS — I5032 Chronic diastolic (congestive) heart failure: Secondary | ICD-10-CM | POA: Diagnosis not present

## 2022-12-23 DIAGNOSIS — M1990 Unspecified osteoarthritis, unspecified site: Secondary | ICD-10-CM | POA: Diagnosis not present

## 2022-12-23 DIAGNOSIS — J4489 Other specified chronic obstructive pulmonary disease: Secondary | ICD-10-CM | POA: Diagnosis not present

## 2022-12-23 DIAGNOSIS — K222 Esophageal obstruction: Secondary | ICD-10-CM | POA: Diagnosis not present

## 2022-12-23 DIAGNOSIS — I2510 Atherosclerotic heart disease of native coronary artery without angina pectoris: Secondary | ICD-10-CM | POA: Diagnosis not present

## 2022-12-23 DIAGNOSIS — G609 Hereditary and idiopathic neuropathy, unspecified: Secondary | ICD-10-CM | POA: Diagnosis not present

## 2022-12-23 DIAGNOSIS — I429 Cardiomyopathy, unspecified: Secondary | ICD-10-CM | POA: Diagnosis not present

## 2022-12-23 DIAGNOSIS — J9621 Acute and chronic respiratory failure with hypoxia: Secondary | ICD-10-CM | POA: Diagnosis not present

## 2022-12-23 DIAGNOSIS — I083 Combined rheumatic disorders of mitral, aortic and tricuspid valves: Secondary | ICD-10-CM | POA: Diagnosis not present

## 2022-12-23 DIAGNOSIS — M0609 Rheumatoid arthritis without rheumatoid factor, multiple sites: Secondary | ICD-10-CM | POA: Diagnosis not present

## 2022-12-23 DIAGNOSIS — R1310 Dysphagia, unspecified: Secondary | ICD-10-CM | POA: Diagnosis not present

## 2022-12-24 DIAGNOSIS — J4489 Other specified chronic obstructive pulmonary disease: Secondary | ICD-10-CM | POA: Diagnosis not present

## 2022-12-24 DIAGNOSIS — R1310 Dysphagia, unspecified: Secondary | ICD-10-CM | POA: Diagnosis not present

## 2022-12-24 DIAGNOSIS — I6529 Occlusion and stenosis of unspecified carotid artery: Secondary | ICD-10-CM | POA: Diagnosis not present

## 2022-12-24 DIAGNOSIS — J9621 Acute and chronic respiratory failure with hypoxia: Secondary | ICD-10-CM | POA: Diagnosis not present

## 2022-12-24 DIAGNOSIS — I5032 Chronic diastolic (congestive) heart failure: Secondary | ICD-10-CM | POA: Diagnosis not present

## 2022-12-24 DIAGNOSIS — G609 Hereditary and idiopathic neuropathy, unspecified: Secondary | ICD-10-CM | POA: Diagnosis not present

## 2022-12-24 DIAGNOSIS — I471 Supraventricular tachycardia, unspecified: Secondary | ICD-10-CM | POA: Diagnosis not present

## 2022-12-24 DIAGNOSIS — M1990 Unspecified osteoarthritis, unspecified site: Secondary | ICD-10-CM | POA: Diagnosis not present

## 2022-12-24 DIAGNOSIS — I083 Combined rheumatic disorders of mitral, aortic and tricuspid valves: Secondary | ICD-10-CM | POA: Diagnosis not present

## 2022-12-24 DIAGNOSIS — I480 Paroxysmal atrial fibrillation: Secondary | ICD-10-CM | POA: Diagnosis not present

## 2022-12-24 DIAGNOSIS — I2510 Atherosclerotic heart disease of native coronary artery without angina pectoris: Secondary | ICD-10-CM | POA: Diagnosis not present

## 2022-12-24 DIAGNOSIS — I110 Hypertensive heart disease with heart failure: Secondary | ICD-10-CM | POA: Diagnosis not present

## 2022-12-24 DIAGNOSIS — I429 Cardiomyopathy, unspecified: Secondary | ICD-10-CM | POA: Diagnosis not present

## 2022-12-24 DIAGNOSIS — I700 Atherosclerosis of aorta: Secondary | ICD-10-CM | POA: Diagnosis not present

## 2022-12-24 DIAGNOSIS — K222 Esophageal obstruction: Secondary | ICD-10-CM | POA: Diagnosis not present

## 2022-12-24 DIAGNOSIS — M0609 Rheumatoid arthritis without rheumatoid factor, multiple sites: Secondary | ICD-10-CM | POA: Diagnosis not present

## 2022-12-24 NOTE — Telephone Encounter (Signed)
Received message from [name] NP requesting eval for sob  Rec ov either [place] or [facility] or [place] whichever easiest quickest with all meds/inhalers in hand next available

## 2022-12-25 ENCOUNTER — Other Ambulatory Visit: Payer: Self-pay | Admitting: Adult Health

## 2022-12-25 DIAGNOSIS — I429 Cardiomyopathy, unspecified: Secondary | ICD-10-CM | POA: Diagnosis not present

## 2022-12-25 DIAGNOSIS — J9621 Acute and chronic respiratory failure with hypoxia: Secondary | ICD-10-CM | POA: Diagnosis not present

## 2022-12-25 DIAGNOSIS — K222 Esophageal obstruction: Secondary | ICD-10-CM | POA: Diagnosis not present

## 2022-12-25 DIAGNOSIS — R1310 Dysphagia, unspecified: Secondary | ICD-10-CM | POA: Diagnosis not present

## 2022-12-25 DIAGNOSIS — M1990 Unspecified osteoarthritis, unspecified site: Secondary | ICD-10-CM | POA: Diagnosis not present

## 2022-12-25 DIAGNOSIS — J4489 Other specified chronic obstructive pulmonary disease: Secondary | ICD-10-CM | POA: Diagnosis not present

## 2022-12-25 DIAGNOSIS — I5032 Chronic diastolic (congestive) heart failure: Secondary | ICD-10-CM | POA: Diagnosis not present

## 2022-12-25 DIAGNOSIS — I700 Atherosclerosis of aorta: Secondary | ICD-10-CM | POA: Diagnosis not present

## 2022-12-25 DIAGNOSIS — I480 Paroxysmal atrial fibrillation: Secondary | ICD-10-CM | POA: Diagnosis not present

## 2022-12-25 DIAGNOSIS — I083 Combined rheumatic disorders of mitral, aortic and tricuspid valves: Secondary | ICD-10-CM | POA: Diagnosis not present

## 2022-12-25 DIAGNOSIS — G609 Hereditary and idiopathic neuropathy, unspecified: Secondary | ICD-10-CM | POA: Diagnosis not present

## 2022-12-25 DIAGNOSIS — I471 Supraventricular tachycardia, unspecified: Secondary | ICD-10-CM | POA: Diagnosis not present

## 2022-12-25 DIAGNOSIS — I110 Hypertensive heart disease with heart failure: Secondary | ICD-10-CM | POA: Diagnosis not present

## 2022-12-25 DIAGNOSIS — I6529 Occlusion and stenosis of unspecified carotid artery: Secondary | ICD-10-CM | POA: Diagnosis not present

## 2022-12-25 DIAGNOSIS — I2510 Atherosclerotic heart disease of native coronary artery without angina pectoris: Secondary | ICD-10-CM | POA: Diagnosis not present

## 2022-12-25 DIAGNOSIS — M0609 Rheumatoid arthritis without rheumatoid factor, multiple sites: Secondary | ICD-10-CM | POA: Diagnosis not present

## 2022-12-27 ENCOUNTER — Other Ambulatory Visit: Payer: Self-pay | Admitting: *Deleted

## 2022-12-27 DIAGNOSIS — D509 Iron deficiency anemia, unspecified: Secondary | ICD-10-CM

## 2022-12-27 NOTE — Telephone Encounter (Signed)
Medications filled by Dr. Renne Crigler and Dr. Dagoberto Ligas

## 2022-12-30 DIAGNOSIS — J4489 Other specified chronic obstructive pulmonary disease: Secondary | ICD-10-CM | POA: Diagnosis not present

## 2022-12-30 DIAGNOSIS — K222 Esophageal obstruction: Secondary | ICD-10-CM | POA: Diagnosis not present

## 2022-12-30 DIAGNOSIS — R1310 Dysphagia, unspecified: Secondary | ICD-10-CM | POA: Diagnosis not present

## 2022-12-30 DIAGNOSIS — I700 Atherosclerosis of aorta: Secondary | ICD-10-CM | POA: Diagnosis not present

## 2022-12-30 DIAGNOSIS — I6529 Occlusion and stenosis of unspecified carotid artery: Secondary | ICD-10-CM | POA: Diagnosis not present

## 2022-12-30 DIAGNOSIS — M1990 Unspecified osteoarthritis, unspecified site: Secondary | ICD-10-CM | POA: Diagnosis not present

## 2022-12-30 DIAGNOSIS — J9621 Acute and chronic respiratory failure with hypoxia: Secondary | ICD-10-CM | POA: Diagnosis not present

## 2022-12-30 DIAGNOSIS — I471 Supraventricular tachycardia, unspecified: Secondary | ICD-10-CM | POA: Diagnosis not present

## 2022-12-30 DIAGNOSIS — I5032 Chronic diastolic (congestive) heart failure: Secondary | ICD-10-CM | POA: Diagnosis not present

## 2022-12-30 DIAGNOSIS — I110 Hypertensive heart disease with heart failure: Secondary | ICD-10-CM | POA: Diagnosis not present

## 2022-12-30 DIAGNOSIS — G609 Hereditary and idiopathic neuropathy, unspecified: Secondary | ICD-10-CM | POA: Diagnosis not present

## 2022-12-30 DIAGNOSIS — I2510 Atherosclerotic heart disease of native coronary artery without angina pectoris: Secondary | ICD-10-CM | POA: Diagnosis not present

## 2022-12-30 DIAGNOSIS — I480 Paroxysmal atrial fibrillation: Secondary | ICD-10-CM | POA: Diagnosis not present

## 2022-12-30 DIAGNOSIS — I429 Cardiomyopathy, unspecified: Secondary | ICD-10-CM | POA: Diagnosis not present

## 2022-12-30 DIAGNOSIS — I083 Combined rheumatic disorders of mitral, aortic and tricuspid valves: Secondary | ICD-10-CM | POA: Diagnosis not present

## 2022-12-30 DIAGNOSIS — M0609 Rheumatoid arthritis without rheumatoid factor, multiple sites: Secondary | ICD-10-CM | POA: Diagnosis not present

## 2022-12-31 ENCOUNTER — Inpatient Hospital Stay (HOSPITAL_BASED_OUTPATIENT_CLINIC_OR_DEPARTMENT_OTHER): Payer: Medicare Other | Admitting: Internal Medicine

## 2022-12-31 ENCOUNTER — Inpatient Hospital Stay: Payer: Medicare Other | Attending: Internal Medicine

## 2022-12-31 VITALS — BP 153/75 | HR 68 | Temp 98.1°F | Resp 16 | Wt 145.9 lb

## 2022-12-31 DIAGNOSIS — Z79899 Other long term (current) drug therapy: Secondary | ICD-10-CM | POA: Insufficient documentation

## 2022-12-31 DIAGNOSIS — D649 Anemia, unspecified: Secondary | ICD-10-CM | POA: Diagnosis not present

## 2022-12-31 DIAGNOSIS — D509 Iron deficiency anemia, unspecified: Secondary | ICD-10-CM

## 2022-12-31 LAB — CBC WITH DIFFERENTIAL (CANCER CENTER ONLY)
Abs Immature Granulocytes: 0.04 10*3/uL (ref 0.00–0.07)
Basophils Absolute: 0.1 10*3/uL (ref 0.0–0.1)
Basophils Relative: 1 %
Eosinophils Absolute: 0.2 10*3/uL (ref 0.0–0.5)
Eosinophils Relative: 2 %
HCT: 34.8 % — ABNORMAL LOW (ref 36.0–46.0)
Hemoglobin: 11.1 g/dL — ABNORMAL LOW (ref 12.0–15.0)
Immature Granulocytes: 0 %
Lymphocytes Relative: 12 %
Lymphs Abs: 1.3 10*3/uL (ref 0.7–4.0)
MCH: 30.9 pg (ref 26.0–34.0)
MCHC: 31.9 g/dL (ref 30.0–36.0)
MCV: 96.9 fL (ref 80.0–100.0)
Monocytes Absolute: 0.8 10*3/uL (ref 0.1–1.0)
Monocytes Relative: 8 %
Neutro Abs: 7.8 10*3/uL — ABNORMAL HIGH (ref 1.7–7.7)
Neutrophils Relative %: 77 %
Platelet Count: 301 10*3/uL (ref 150–400)
RBC: 3.59 MIL/uL — ABNORMAL LOW (ref 3.87–5.11)
RDW: 14.9 % (ref 11.5–15.5)
WBC Count: 10.1 10*3/uL (ref 4.0–10.5)
nRBC: 0 % (ref 0.0–0.2)

## 2022-12-31 LAB — CMP (CANCER CENTER ONLY)
ALT: 14 U/L (ref 0–44)
AST: 20 U/L (ref 15–41)
Albumin: 4 g/dL (ref 3.5–5.0)
Alkaline Phosphatase: 63 U/L (ref 38–126)
Anion gap: 7 (ref 5–15)
BUN: 19 mg/dL (ref 8–23)
CO2: 29 mmol/L (ref 22–32)
Calcium: 9.1 mg/dL (ref 8.9–10.3)
Chloride: 103 mmol/L (ref 98–111)
Creatinine: 0.98 mg/dL (ref 0.44–1.00)
GFR, Estimated: 57 mL/min — ABNORMAL LOW (ref >60)
Glucose, Bld: 69 mg/dL — ABNORMAL LOW (ref 70–99)
Potassium: 4.9 mmol/L (ref 3.5–5.1)
Sodium: 139 mmol/L (ref 135–145)
Total Bilirubin: 0.5 mg/dL (ref 0.3–1.2)
Total Protein: 6.7 g/dL (ref 6.5–8.1)

## 2022-12-31 LAB — VITAMIN B12: Vitamin B-12: 488 pg/mL (ref 180–914)

## 2022-12-31 LAB — IRON AND IRON BINDING CAPACITY (CC-WL,HP ONLY)
Iron: 128 ug/dL (ref 28–170)
Saturation Ratios: 29 % (ref 10.4–31.8)
TIBC: 444 ug/dL (ref 250–450)
UIBC: 316 ug/dL (ref 148–442)

## 2022-12-31 LAB — FERRITIN: Ferritin: 34 ng/mL (ref 11–307)

## 2022-12-31 NOTE — Progress Notes (Signed)
[place] Telephone:([phone]   Fax:(336) [phone]  OFFICE PROGRESS NOTE  [name], NP [place] [address]  DIAGNOSIS: Normocytic anemia likely anemia of chronic disease +/- iron deficiency.   PRIOR THERAPY: None  CURRENT THERAPY: Folivane plus 1 capsule p.o. daily  INTERVAL HISTORY: Julie Rice 87 y.o. female returns to the clinic today for follow-up visit accompanied by her sister-in-law.  The patient is feeling fine today with no concerning complaints except for mild fatigue.  She had upper endoscopy by Dr. Loletha Carrow during her hospitalization and that showed LA grade C esophagitis with no bleeding.  She also had 4 cm hiatal hernia.  She did have stretching of her esophagus.  She denied having any current chest pain, shortness of breath, cough or hemoptysis.  She has no nausea, vomiting, diarrhea or constipation.  She has no headache or visual changes.  She continues to tolerate her treatment with oral iron tablet fairly well.  She is here today for evaluation and repeat blood work.  MEDICAL HISTORY: Past Medical History:  Diagnosis Date   A-fib ([place])    Anemia    Anxiety    Bipolar disorder ([place])    Blood transfusion 1991   autologous pts own blood given    CAD (coronary artery disease)    Cataract    Chest pain    "@ rest, lying down, w/exertion"   CHF (congestive heart failure) ([place]) 07/2020   Chronic back pain    "mostly lower back but I do have upper back pain regularly" (04/06/2018)   COPD (chronic obstructive pulmonary disease) ([facility])    Depression    Esophageal stricture    Fibromyalgia    GERD (gastroesophageal reflux disease)    Heart murmur    "slight" (04/06/2018)   Hiatal hernia    Hyperlipidemia    Patient denies   Hypertension    Internal hemorrhoids    Lumbago    Mitral regurgitation    Osteoarthritis    Osteoporosis    Pneumonia ~ 01/2018   PONV (postoperative nausea and vomiting)    severe  ponv, "in the past" (04/06/2018)   Rectal bleeding    Rheumatoid arthritis ([place])    Spondylosis    TIA (transient ischemic attack) 2013   Urinary incontinence    wears depends    ALLERGIES:  is allergic to tape, tramadol, and morphine.  MEDICATIONS:  Current Outpatient Medications  Medication Sig Dispense Refill   acetaminophen (TYLENOL) 500 MG tablet Take 1,000 mg by mouth every 6 (six) hours as needed for moderate pain, headache or fever.     albuterol (VENTOLIN HFA) 108 (90 Base) MCG/ACT inhaler TAKE 2 PUFFS BY MOUTH EVERY 6 HOURS AS NEEDED FOR WHEEZE OR SHORTNESS OF BREATH (Patient taking differently: Inhale 2 puffs into the lungs every 6 (six) hours as needed for wheezing or shortness of breath.) 8.5 each 2   amiodarone (PACERONE) 200 MG tablet Take 1 tablet (200 mg total) by mouth daily.     buPROPion (WELLBUTRIN XL) 300 MG 24 hr tablet TAKE 1 TABLET BY MOUTH EVERY DAY 90 tablet 1   Carboxymethylcellulose Sodium (REFRESH TEARS OP) Place 1 drop into both eyes 2 (two) times daily.     cetirizine (ZYRTEC) 10 MG tablet Take 10 mg by mouth daily as needed for allergies.     clonazePAM (KLONOPIN) 0.5 MG tablet Take 1 tablet (0.5 mg total) by mouth 2 (two) times daily. [place]  tablet 2   DULoxetine (CYMBALTA) 60 MG capsule TAKE 1 CAPSULE BY MOUTH EVERY DAY (Patient taking differently: Take 60 mg by mouth daily.) 90 capsule 3   ELIQUIS 5 MG TABS tablet TAKE 1 TABLET BY MOUTH TWICE A DAY (Patient taking differently: Take 5 mg by mouth 2 (two) times daily.) 180 tablet 1   FeFum-FePoly-FA-B Cmp-C-Biot (FOLIVANE-PLUS) CAPS TAKE 1 CAPSULE BY MOUTH EVERY DAY IN THE MORNING (Patient taking differently: Take 1 capsule by mouth daily.) 90 capsule 0   KLOR-CON M20 20 MEQ tablet TAKE 1 TABLET BY MOUTH EVERY DAY 90 tablet 2   losartan (COZAAR) 50 MG tablet TAKE 1 TABLET BY MOUTH EVERY DAY 90 tablet 3   metoprolol tartrate (LOPRESSOR) 25 MG tablet Take 0.5 tablets (12.5 mg total) by mouth 2 (two) times daily.      Multiple Vitamins-Minerals (ONE-A-DAY WOMENS 50+) TABS Take 1 tablet by mouth daily.     omeprazole (PRILOSEC) 20 MG capsule Take 20 mg by mouth daily as needed (for heartburn).     rosuvastatin (CRESTOR) 20 MG tablet Take 1 tablet (20 mg total) by mouth daily at 6 PM.     sodium chloride (OCEAN) 0.65 % SOLN nasal spray Place 1 spray into both nostrils as needed for congestion.     No current facility-administered medications for this visit.    SURGICAL HISTORY:  Past Surgical History:  Procedure Laterality Date   ABDOMINAL HYSTERECTOMY  1982   BACK SURGERY     BALLOON DILATION N/A 09/21/2018   Procedure: BALLOON DILATION;  Surgeon: Irene Shipper, MD;  Location: [facility];  Service: Endoscopy;  Laterality: N/A;   BALLOON DILATION N/A 08/12/2019   Procedure: BALLOON DILATION;  Surgeon: Jackquline Denmark, MD;  Location: [facility] ENDOSCOPY;  Service: Endoscopy;  Laterality: N/A;   BIOPSY  08/12/2019   Procedure: BIOPSY;  Surgeon: Jackquline Denmark, MD;  Location: [facility];  Service: Endoscopy;;   BLADDER SUSPENSION  1980's   BOTOX INJECTION N/A 09/21/2018   Procedure: BOTOX INJECTION;  Surgeon: Irene Shipper, MD;  Location: [facility];  Service: Endoscopy;  Laterality: N/A;   BOTOX INJECTION N/A 08/12/2019   Procedure: BOTOX INJECTION;  Surgeon: Jackquline Denmark, MD;  Location: [facility] ENDOSCOPY;  Service: Endoscopy;  Laterality: N/A;   BOTOX INJECTION N/A 09/27/2022   Procedure: BOTOX INJECTION;  Surgeon: Doran Stabler, MD;  Location: [facility];  Service: Gastroenterology;  Laterality: N/A;   CATARACT EXTRACTION W/ INTRAOCULAR LENS  IMPLANT, BILATERAL Bilateral 2010   DILATION AND CURETTAGE OF UTERUS  1961   ESOPHAGEAL MANOMETRY N/A 03/25/2018   Procedure: ESOPHAGEAL MANOMETRY (EM);  Surgeon: Mauri Pole, MD;  Location: [facility];  Service: Endoscopy;  Laterality: N/A;   ESOPHAGOGASTRODUODENOSCOPY (EGD) WITH PROPOFOL N/A 04/07/2018   Procedure: ESOPHAGOGASTRODUODENOSCOPY (EGD) WITH  PROPOFOL;  Surgeon: Doran Stabler, MD;  Location: [place] [facility];  Service: Gastroenterology;  Laterality: N/A;   ESOPHAGOGASTRODUODENOSCOPY (EGD) WITH PROPOFOL N/A 09/21/2018   Procedure: ESOPHAGOGASTRODUODENOSCOPY (EGD) WITH PROPOFOL;  Surgeon: Irene Shipper, MD;  Location: [facility];  Service: Endoscopy;  Laterality: N/A;   ESOPHAGOGASTRODUODENOSCOPY (EGD) WITH PROPOFOL N/A 08/12/2019   Procedure: ESOPHAGOGASTRODUODENOSCOPY (EGD) WITH PROPOFOL;  Surgeon: Jackquline Denmark, MD;  Location: [facility] ENDOSCOPY;  Service: Endoscopy;  Laterality: N/A;   ESOPHAGOGASTRODUODENOSCOPY (EGD) WITH PROPOFOL N/A 09/27/2022   Procedure: ESOPHAGOGASTRODUODENOSCOPY (EGD) WITH PROPOFOL;  Surgeon: Doran Stabler, MD;  Location: [place];  Service: Gastroenterology;  Laterality: N/A;   HERNIA REPAIR     HIATAL  HERNIA REPAIR N/A 08/02/2016   Procedure: LAPAROSCOPIC REPAIR OF LARGE  HIATAL HERNIA;  Surgeon: Excell Seltzer, MD;  Location: [facility];  Service: General;  Laterality: N/A;   JOINT REPLACEMENT     LAPAROSCOPIC NISSEN FUNDOPLICATION N/A [id number]   Procedure: LAPAROSCOPIC NISSEN FUNDOPLICATION;  Surgeon: Excell Seltzer, MD;  Location: [facility];  Service: General;  Laterality: N/A;   [place]; [id number]   "I've got 2 stainless steel rods; 6 screws; 2 ray cages"took bone from right hip to put in back   RIGHT/LEFT HEART CATH AND CORONARY ANGIOGRAPHY N/A 07/26/2019   Procedure: RIGHT/LEFT HEART CATH AND CORONARY ANGIOGRAPHY;  Surgeon: Belva Crome, MD;  Location: [facility] CV LAB;  Service: Cardiovascular;  Laterality: N/A;   SVT ABLATION N/A 04/25/2021   Procedure: SVT ABLATION;  Surgeon: Constance Haw, MD;  Location: [place];  Service: Cardiovascular;  Laterality: N/A;   TEE WITHOUT CARDIOVERSION N/A 08/19/2019   Procedure: TRANSESOPHAGEAL ECHOCARDIOGRAM (TEE);  Surgeon: Lelon Perla, MD;  Location: [facility] END[facility]COPY;  Service: Cardiovascular;  Laterality: N/A;    TONSILLECTOMY AND ADENOIDECTOMY  1945   TOTAL KNEE ARTHROPLASTY Right ~ [place]  ~ [place]:  A comprehensive review of systems was negative except for: Constitutional: positive for fatigue Musculoskeletal: positive for arthralgias   PHYSICAL EXAMINATION: General appearance: alert, cooperative, fatigued, and no distress Head: Normocephalic, without obvious abnormality, atraumatic Neck: no adenopathy, no JVD, supple, symmetrical, trachea midline, and thyroid not enlarged, symmetric, no tenderness/mass/nodules Lymph nodes: Cervical, supraclavicular, and axillary nodes normal. Resp: clear to auscultation bilaterally Back: symmetric, no curvature. ROM normal. No CVA tenderness. Cardio: regular rate and rhythm, S1, S2 normal, no murmur, click, rub or gallop GI: soft, non-tender; bowel sounds normal; no masses,  no organomegaly Extremities: extremities normal, atraumatic, no cyanosis or edema  ECOG PERFORMANCE STATUS: 1 - Symptomatic but completely ambulatory  Blood pressure (!) 153/75, pulse 68, temperature 98.1 F (36.7 C), temperature source Oral, resp. rate 16, weight 145 lb 14.4 oz (66.2 kg), SpO2 98 %.  LABORATORY DATA: Lab Results  Component Value Date   WBC 10.1 12/31/2022   HGB 11.1 (L) 12/31/2022   HCT 34.8 (L) 12/31/2022   MCV 96.9 12/31/2022   PLT 301 12/31/2022      Chemistry      Component Value Date/Time   NA 139 12/13/2022 1115   NA 142 06/14/2022 1441   K 5.0 12/13/2022 1115   CL 106 12/13/2022 1115   CO2 26 12/13/2022 1115   BUN 18 12/13/2022 1115   BUN 26 06/14/2022 1441   CREATININE 0.87 12/13/2022 1115   CREATININE 1.19 (H) 08/27/2022 1159      Component Value Date/Time   CALCIUM 8.4 12/13/2022 1115   ALKPHOS 57 12/13/2022 1115   AST 18 12/13/2022 1115   AST 18 08/27/2022 1159   ALT 14 12/13/2022 1115   ALT 16 08/27/2022 1159   BILITOT 0.3 12/13/2022 1115   BILITOT 0.3 08/27/2022 1159       RADIOGRAPHIC  STUDIES: ECHOCARDIOGRAM COMPLETE  Result Date: 12/08/2022    ECHOCARDIOGRAM REPORT   Patient Name:   Julie Rice Date of Exam: 12/08/2022 Medical Rec #:  [id number]        Height:       63.0 in Accession #:    [id number]       Weight:       144.3 lb Date of Birth:  09-20-1936         BSA:          1.683 m Patient Age:    53 years         BP:           118/70 mmHg Patient Gender: F                HR:           90 bpm. Exam Location:  Inpatient Procedure: 2D Echo, Cardiac Doppler and Color Doppler Indications:    R07.9* Chest pain, unspecified  History:        Patient has prior history of Echocardiogram examinations, most                 recent 10/21/2022. CAD, COPD and Carotid Disease,                 Arrythmias:Atrial Fibrillation, Signs/Symptoms:Dyspnea; Risk                 Factors:Hypertension and Dyslipidemia.  Sonographer:    Wilkie Aye RVT RCS Referring Phys: [id number] Royanne Foots [facility]  Sonographer Comments: Technically challenging study due to limited acoustic windows, Technically difficult study due to poor echo windows, suboptimal parasternal window, suboptimal apical window and suboptimal subcostal window. IMPRESSIONS  1. Left ventricular ejection fraction, by estimation, is 70 to 75%. Left ventricular ejection fraction by PLAX is 75 %. The left ventricle has hyperdynamic function. The left ventricle has no regional wall motion abnormalities. There is mild left ventricular hypertrophy. Left ventricular diastolic parameters are consistent with Grade I diastolic dysfunction (impaired relaxation).  2. Right ventricular systolic function is normal. The right ventricular size is normal. There is normal pulmonary artery systolic pressure. The estimated right ventricular systolic pressure is 123456 mmHg.  3. The mitral valve is abnormal. Trivial mitral valve regurgitation. Moderate to severe mitral annular calcification.  4. The aortic valve is tricuspid. Aortic valve regurgitation is trivial. Aortic valve  sclerosis/calcification is present, without any evidence of aortic stenosis.  5. The inferior vena cava is normal in size with greater than 50% respiratory variability, suggesting right atrial pressure of 3 mmHg. Comparison(s): Changes from prior study are noted. 10/21/2022: LVEF 60-65%, grade 2 DD. FINDINGS  Left Ventricle: Left ventricular ejection fraction, by estimation, is 70 to 75%. Left ventricular ejection fraction by PLAX is 75 %. The left ventricle has hyperdynamic function. The left ventricle has no regional wall motion abnormalities. The left ventricular internal cavity size was normal in size. There is mild left ventricular hypertrophy. Left ventricular diastolic parameters are consistent with Grade I diastolic dysfunction (impaired relaxation). Indeterminate filling pressures. Right Ventricle: The right ventricular size is normal. No increase in right ventricular wall thickness. Right ventricular systolic function is normal. There is normal pulmonary artery systolic pressure. The tricuspid regurgitant velocity is 1.70 m/s, and  with an assumed right atrial pressure of 3 mmHg, the estimated right ventricular systolic pressure is 123456 mmHg. Left Atrium: Left atrial size was normal in size. Right Atrium: Right atrial size was normal in size. Pericardium: There is no evidence of pericardial effusion. Mitral Valve: The mitral valve is abnormal. Moderate to severe mitral annular calcification. Trivial mitral valve regurgitation. Tricuspid Valve: The tricuspid valve is grossly normal. Tricuspid valve regurgitation is trivial. Aortic Valve: The aortic valve is tricuspid. Aortic valve regurgitation is trivial. Aortic valve sclerosis/calcification is present, without any evidence of aortic stenosis. Aortic valve mean gradient measures 4.0 mmHg. Aortic valve peak  gradient measures 5.9 mmHg. Aortic valve area, by VTI measures 2.12 cm. Pulmonic Valve: The pulmonic valve was not well visualized. Pulmonic valve  regurgitation is not visualized. Aorta: The aortic root and ascending aorta are structurally normal, with no evidence of dilitation. Venous: The inferior vena cava is normal in size with greater than 50% respiratory variability, suggesting right atrial pressure of 3 mmHg. IAS/Shunts: No atrial level shunt detected by color flow Doppler.  LEFT VENTRICLE PLAX 2D LV EF:         Left ventricular ejection fraction by PLAX is 75 %. LVIDd:         3.70 cm LVIDs:         2.10 cm LV PW:         1.20 cm LV IVS:        1.00 cm LVOT diam:     1.80 cm LV SV:         52 LV SV Index:   31 LVOT Area:     2.54 cm  RIGHT VENTRICLE             IVC RV Basal diam:  3.60 cm     IVC diam: 1.70 cm RV S prime:     16.20 cm/s TAPSE (M-mode): 2.5 cm LEFT ATRIUM           Index        RIGHT ATRIUM           Index LA diam:      3.70 cm 2.20 cm/m   RA Area:     14.70 cm LA Vol (A2C): 47.5 ml 28.22 ml/m  RA Volume:   34.80 ml  20.68 ml/m  AORTIC VALVE AV Area (Vmax):    2.21 cm AV Area (Vmean):   1.92 cm AV Area (VTI):     2.12 cm AV Vmax:           121.00 cm/s AV Vmean:          91.900 cm/s AV VTI:            0.244 m AV Peak Grad:      5.9 mmHg AV Mean Grad:      4.0 mmHg LVOT Vmax:         105.00 cm/s LVOT Vmean:        69.200 cm/s LVOT VTI:          0.203 m LVOT/AV VTI ratio: 0.83  AORTA Ao Root diam: 3.00 cm Ao Asc diam:  3.40 cm MITRAL VALVE                TRICUSPID VALVE MV Area (PHT): 2.14 cm     TR Peak grad:   11.6 mmHg MV Decel Time: 354 msec     TR Vmax:        170.00 cm/s MV E velocity: 74.20 cm/s MV A velocity: 127.00 cm/s  SHUNTS MV E/A ratio:  0.58         Systemic VTI:  0.20 m                             Systemic Diam: 1.80 cm Lyman Bishop MD Electronically signed by Lyman Bishop MD Signature Date/Time: 12/08/2022/4:33:42 PM    Final    DG Chest Port 1 View  Result Date: 12/07/2022 CLINICAL DATA:  Shortness of breath, chest pain EXAM: PORTABLE CHEST 1 VIEW COMPARISON:  09/22/2022 FINDINGS: Atherosclerotic calcification  of  the aortic arch. Levoconvex lower thoracic scoliosis. Currently the heart size is within normal limits. The lungs appear clear. No blunting of the costophrenic angles (with noted exclusion of the tip of the left costophrenic angle from imaging). IMPRESSION: 1. No active cardiopulmonary disease is radiographically apparent. 2. Levoconvex lower thoracic scoliosis. 3.  Aortic Atherosclerosis (ICD10-I70.0). Electronically Signed   By: Van Clines M.D.   On: 12/07/2022 17:00    ASSESSMENT AND PLAN: This is a very pleasant 87 years old white female with normocytic normochromic anemia suspicious for anemia of chronic disease/iron deficiency.  The patient is currently on oral iron tablets with Folivane plus and tolerating it fairly well. Repeat CBC today showed mild anemia with hemoglobin of 11.1 and hematocrit of 34.8%.  Iron study and ferritin are still pending. I recommended for the patient to continue her current treatment with the oral iron supplements. I will see her back for follow-up visit in 6 months for evaluation and repeat blood work. The patient was advised to call immediately if she has any other concerning symptoms in the interval. The patient voices understanding of current disease status and treatment options and is in agreement with the current care plan.  All questions were answered. The patient knows to call the clinic with any problems, questions or concerns. We can certainly see the patient much sooner if necessary.  The total time spent in the appointment was 20 minutes.  Disclaimer: This note was dictated with voice recognition software. Similar sounding words can inadvertently be transcribed and may not be corrected upon review.

## 2023-01-01 ENCOUNTER — Other Ambulatory Visit: Payer: Self-pay | Admitting: Adult Health

## 2023-01-01 DIAGNOSIS — R1310 Dysphagia, unspecified: Secondary | ICD-10-CM | POA: Diagnosis not present

## 2023-01-01 DIAGNOSIS — I471 Supraventricular tachycardia, unspecified: Secondary | ICD-10-CM | POA: Diagnosis not present

## 2023-01-01 DIAGNOSIS — F419 Anxiety disorder, unspecified: Secondary | ICD-10-CM

## 2023-01-01 DIAGNOSIS — M0609 Rheumatoid arthritis without rheumatoid factor, multiple sites: Secondary | ICD-10-CM | POA: Diagnosis not present

## 2023-01-01 DIAGNOSIS — I2510 Atherosclerotic heart disease of native coronary artery without angina pectoris: Secondary | ICD-10-CM | POA: Diagnosis not present

## 2023-01-01 DIAGNOSIS — J9621 Acute and chronic respiratory failure with hypoxia: Secondary | ICD-10-CM | POA: Diagnosis not present

## 2023-01-01 DIAGNOSIS — M1990 Unspecified osteoarthritis, unspecified site: Secondary | ICD-10-CM | POA: Diagnosis not present

## 2023-01-01 DIAGNOSIS — J4489 Other specified chronic obstructive pulmonary disease: Secondary | ICD-10-CM | POA: Diagnosis not present

## 2023-01-01 DIAGNOSIS — I083 Combined rheumatic disorders of mitral, aortic and tricuspid valves: Secondary | ICD-10-CM | POA: Diagnosis not present

## 2023-01-01 DIAGNOSIS — G609 Hereditary and idiopathic neuropathy, unspecified: Secondary | ICD-10-CM | POA: Diagnosis not present

## 2023-01-01 DIAGNOSIS — I480 Paroxysmal atrial fibrillation: Secondary | ICD-10-CM | POA: Diagnosis not present

## 2023-01-01 DIAGNOSIS — I700 Atherosclerosis of aorta: Secondary | ICD-10-CM | POA: Diagnosis not present

## 2023-01-01 DIAGNOSIS — I6529 Occlusion and stenosis of unspecified carotid artery: Secondary | ICD-10-CM | POA: Diagnosis not present

## 2023-01-01 DIAGNOSIS — I429 Cardiomyopathy, unspecified: Secondary | ICD-10-CM | POA: Diagnosis not present

## 2023-01-01 DIAGNOSIS — I5032 Chronic diastolic (congestive) heart failure: Secondary | ICD-10-CM | POA: Diagnosis not present

## 2023-01-01 DIAGNOSIS — K222 Esophageal obstruction: Secondary | ICD-10-CM | POA: Diagnosis not present

## 2023-01-01 DIAGNOSIS — I110 Hypertensive heart disease with heart failure: Secondary | ICD-10-CM | POA: Diagnosis not present

## 2023-01-01 NOTE — Telephone Encounter (Signed)
Okay for refill?  

## 2023-01-02 NOTE — Telephone Encounter (Signed)
I called and spoke with the pt and scheduled with TP for next wk 01/07/23

## 2023-01-05 ENCOUNTER — Other Ambulatory Visit: Payer: Self-pay | Admitting: Adult Health

## 2023-01-05 DIAGNOSIS — R0600 Dyspnea, unspecified: Secondary | ICD-10-CM

## 2023-01-07 ENCOUNTER — Encounter: Payer: Self-pay | Admitting: Adult Health

## 2023-01-07 ENCOUNTER — Ambulatory Visit (INDEPENDENT_AMBULATORY_CARE_PROVIDER_SITE_OTHER): Payer: Medicare Other | Admitting: Adult Health

## 2023-01-07 VITALS — BP 120/60 | HR 86 | Temp 97.6°F | Ht 62.5 in | Wt 148.6 lb

## 2023-01-07 DIAGNOSIS — J9611 Chronic respiratory failure with hypoxia: Secondary | ICD-10-CM | POA: Diagnosis not present

## 2023-01-07 DIAGNOSIS — K224 Dyskinesia of esophagus: Secondary | ICD-10-CM

## 2023-01-07 DIAGNOSIS — J4520 Mild intermittent asthma, uncomplicated: Secondary | ICD-10-CM | POA: Diagnosis not present

## 2023-01-07 DIAGNOSIS — R0602 Shortness of breath: Secondary | ICD-10-CM

## 2023-01-07 DIAGNOSIS — R0609 Other forms of dyspnea: Secondary | ICD-10-CM

## 2023-01-07 NOTE — Patient Instructions (Addendum)
Albuterol inhaler As needed   Set up for HRCT Chest  Begin Oxygen 4l/m with activity and 3l/m At bedtime   Order for POC /or portable Oxygen  Follow up with GI as planned next week.  Follow up with Dr. Melvyn Novas  in 3-4 weeks with PFT and As needed   Please contact office for sooner follow up if symptoms do not improve or worsen or seek emergency care

## 2023-01-07 NOTE — Progress Notes (Unsigned)
$'@Patient'C$  ID: Julie Rice, female    DOB: October 01, 1936, 87 y.o.   MRN: [id number]  Chief Complaint  Patient presents with   Follow-up    Referring provider: [name], NP  HPI: 87 yo female never smoker followed for Asthma  Medical history significant for esophageal dysmotility, GERD, large symptomatic hiatal hernia status post Nissen fundoplication in September 2017.  Esophageal stricture status post dilatation April 2018, upper endoscopy with Botox injection and balloon dilatation of the esophagus in November 2019. , CHF and RA , A Fib   TEST/EVENTS :  Previous CT chest in February 2019 she had a ncecrotic mass vs consolidation with necrosis along the right upper lobe measuring max 6.5cm. Serial Chest xray showed resoultion with residual scarring .    CT chest February 04, 2019- for PE, diffuse groundglass nodules and mild tree-in-bud nodularity, increased diffuse distention of the thoracic esophagus containing a large amount of fluid and air   HRCT chest -spectrum of findings which is favored to reflect an evolving atypical infection rather than interstitial lung disease. Clinical correlation for signs and symptoms of mycobacterium avium intracellulare (MAI) or other chronic atypical infection is recommended. If there is persistent clinical concern for interstitial lung disease, further evaluation with repeat high-resolution chest CT is recommended in 12 months. 2. Small pulmonary nodules in the lungs bilaterally, generally stable compared to the prior examination with a few exceptions, as above. Attention at time of repeat high-resolution chest CT is recommended in 12 months to ensure stability or resolution. 3. Mild air trapping indicative of small airways disease. 4. Aortic atherosclerosis, in addition to 2 vessel coronary artery disease.   Pulmonary function testing 04/2019  showed FEV1 at 87%, ratio 78, FVC 83%.  Positive mid flow reversibility.  DLCO 76%  01/07/2023  Follow up : Asthma, Dyspnea and O2 RF  Patient presents for a follow-up visit. Last seen in the office in 2022.  Patient complains that in June of 2022  she was diagnosed with  CHF and A Fib . Following with cardiology . Started on Amiodarone and Eliquis .  Discharged home on Oxygen . Uses Oxygen As needed  during daytime and wears 2l/m at night . Complains of increased dyspnea with activity over last 4 months .  No significant cough or wheezing . Decreased activity tolerance, O2 sats have been dropping more at home with activities. Today in office required O2 at 4l/m to keep sats >88-90% while walking . O2 sats dropped to 80% on room air. Tried on Symbicort in past did not tolerate.   Has chronic dysmotility , has follow up with GI next week.    Allergies  Allergen Reactions   Tape Other (See Comments)    Band aides, adhesive tape Redness and pulls skin off   Tramadol Other (See Comments)    Pt has prolonged Qtc interval of 540- cannot give tramadol per pharmacy   Morphine Itching and Rash    Flushing    Immunization History  Administered Date(s) Administered   Fluad Quad(high Dose 65+) 07/24/2019, 10/15/2020, 07/30/2022   Influenza Whole 09/04/2007, 08/16/2008   Influenza, High Dose Seasonal PF 08/04/2018   Influenza,inj,Quad PF,6+ Mos 08/17/2013, 09/02/2014   Pneumococcal Conjugate-13 09/02/2014   Pneumococcal Polysaccharide-23 06/01/2013   Td 04/08/2006   Zoster, Live 04/08/2006    Past Medical History:  Diagnosis Date   A-fib ([place])    Anemia    Anxiety    Bipolar disorder ([place])    Blood transfusion 1991  autologous pts own blood given    CAD (coronary artery disease)    Cataract    Chest pain    "@ rest, lying down, w/exertion"   CHF (congestive heart failure) ([place]) 07/2020   Chronic back pain    "mostly lower back but I do have upper back pain regularly" (04/06/2018)   COPD (chronic obstructive pulmonary disease) (HCC)    Depression    Esophageal stricture     Fibromyalgia    GERD (gastroesophageal reflux disease)    Heart murmur    "slight" (04/06/2018)   Hiatal hernia    Hyperlipidemia    Patient denies   Hypertension    Internal hemorrhoids    Lumbago    Mitral regurgitation    Osteoarthritis    Osteoporosis    Pneumonia ~ 01/2018   PONV (postoperative nausea and vomiting)    severe ponv, "in the past" (04/06/2018)   Rectal bleeding    Rheumatoid arthritis ([place])    Spondylosis    TIA (transient ischemic attack) 2013   Urinary incontinence    wears depends    Tobacco History: Social History   Tobacco Use  Smoking Status Never  Smokeless Tobacco Never   Counseling given: Not Answered   Outpatient Medications Prior to Visit  Medication Sig Dispense Refill   acetaminophen (TYLENOL) 500 MG tablet Take 1,000 mg by mouth every 6 (six) hours as needed for moderate pain, headache or fever.     albuterol (VENTOLIN HFA) 108 (90 Base) MCG/ACT inhaler TAKE TWO PUFFS BY MOUTH EVERY 6 HOURS AS NEEDED FOR WHEEZING OR SHORTNESS OF BREATH 8.5 g 0   amiodarone (PACERONE) 200 MG tablet Take 1 tablet (200 mg total) by mouth daily.     buPROPion (WELLBUTRIN XL) 300 MG 24 hr tablet TAKE 1 TABLET BY MOUTH EVERY DAY 90 tablet 1   Carboxymethylcellulose Sodium (REFRESH TEARS OP) Place 1 drop into both eyes 2 (two) times daily.     cetirizine (ZYRTEC) 10 MG tablet Take 10 mg by mouth daily as needed for allergies.     clonazePAM (KLONOPIN) 0.5 MG tablet Take 1 tablet (0.5 mg total) by mouth 2 (two) times daily. 60 tablet 2   DULoxetine (CYMBALTA) 60 MG capsule TAKE 1 CAPSULE BY MOUTH EVERY DAY (Patient taking differently: Take 60 mg by mouth daily.) 90 capsule 3   ELIQUIS 5 MG TABS tablet TAKE 1 TABLET BY MOUTH TWICE A DAY (Patient taking differently: Take 5 mg by mouth 2 (two) times daily.) 180 tablet 1   FeFum-FePoly-FA-B Cmp-C-Biot (FOLIVANE-PLUS) CAPS TAKE 1 CAPSULE BY MOUTH EVERY DAY IN THE MORNING (Patient taking differently: Take 1 capsule by mouth  daily.) 90 capsule 0   KLOR-CON M20 20 MEQ tablet TAKE 1 TABLET BY MOUTH EVERY DAY 90 tablet 2   losartan (COZAAR) 50 MG tablet TAKE 1 TABLET BY MOUTH EVERY DAY 90 tablet 3   metoprolol tartrate (LOPRESSOR) 25 MG tablet Take 0.5 tablets (12.5 mg total) by mouth 2 (two) times daily.     Multiple Vitamins-Minerals (ONE-A-DAY WOMENS 50+) TABS Take 1 tablet by mouth daily.     omeprazole (PRILOSEC) 20 MG capsule Take 20 mg by mouth daily as needed (for heartburn).     rosuvastatin (CRESTOR) 20 MG tablet Take 1 tablet (20 mg total) by mouth daily at 6 PM.     sodium chloride (OCEAN) 0.65 % SOLN nasal spray Place 1 spray into both nostrils as needed for congestion.     No facility-administered  medications prior to visit.     Review of Systems:   Constitutional:   No  weight loss, night sweats,  Fevers, chills, fatigue, or  lassitude.  HEENT:   No headaches,  Difficulty swallowing,  Tooth/dental problems, or  Sore throat,                No sneezing, itching, ear ache, nasal congestion, post nasal drip,   CV:  No chest pain,  Orthopnea, PND, swelling in lower extremities, anasarca, dizziness, palpitations, syncope.   GI  No heartburn, indigestion, abdominal pain, nausea, vomiting, diarrhea, change in bowel habits, loss of appetite, bloody stools.   Resp: No shortness of breath with exertion or at rest.  No excess mucus, no productive cough,  No non-productive cough,  No coughing up of blood.  No change in color of mucus.  No wheezing.  No chest wall deformity  Skin: no rash or lesions.  GU: no dysuria, change in color of urine, no urgency or frequency.  No flank pain, no hematuria   MS:  No joint pain or swelling.  No decreased range of motion.  No back pain.    Physical Exam  BP 120/60 (BP Location: Left Arm, Patient Position: Sitting, Cuff Size: Normal)   Pulse 86   Temp 97.6 F (36.4 C) (Oral)   Ht 5' 2.5" (1.588 m)   Wt 148 lb 9.6 oz (67.4 kg)   SpO2 97%   BMI 26.75 kg/m    GEN: A/Ox3; pleasant , NAD, well nourished    HEENT:  [place]/AT,  EACs-clear, TMs-wnl, NOSE-clear, THROAT-clear, no lesions, no postnasal drip or exudate noted.   NECK:  Supple w/ fair ROM; no JVD; normal carotid impulses w/o bruits; no thyromegaly or nodules palpated; no lymphadenopathy.    RESP  Clear  P & A; w/o, wheezes/ rales/ or rhonchi. no accessory muscle use, no dullness to percussion  CARD:  RRR, no m/r/g, no peripheral edema, pulses intact, no cyanosis or clubbing.  GI:   Soft & nt; nml bowel sounds; no organomegaly or masses detected.   Musco: Warm bil, no deformities or joint swelling noted.   Neuro: alert, no focal deficits noted.    Skin: Warm, no lesions or rashes    Lab Results:  CBC    Component Value Date/Time   WBC 10.1 12/31/2022 1041   WBC 12.6 (H) 12/13/2022 1115   RBC 3.59 (L) 12/31/2022 1041   HGB 11.1 (L) 12/31/2022 1041   HGB 10.9 (L) 05/14/2022 1432   HCT 34.8 (L) 12/31/2022 1041   HCT 33.5 (L) 05/14/2022 1432   PLT 301 12/31/2022 1041   PLT 334 05/14/2022 1432   MCV 96.9 12/31/2022 1041   MCV 88 05/14/2022 1432   MCH 30.9 12/31/2022 1041   MCHC 31.9 12/31/2022 1041   RDW 14.9 12/31/2022 1041   RDW 16.5 (H) 05/14/2022 1432   LYMPHSABS 1.3 12/31/2022 1041   MONOABS 0.8 12/31/2022 1041   EOSABS 0.2 12/31/2022 1041   BASOSABS 0.1 12/31/2022 1041    BMET    Component Value Date/Time   NA 139 12/31/2022 1041   NA 142 06/14/2022 1441   K 4.9 12/31/2022 1041   CL 103 12/31/2022 1041   CO2 29 12/31/2022 1041   GLUCOSE 69 (L) 12/31/2022 1041   BUN 19 12/31/2022 1041   BUN 26 06/14/2022 1441   CREATININE 0.98 12/31/2022 1041   CALCIUM 9.1 12/31/2022 1041   GFRNONAA 57 (L) 12/31/2022 1041   GFRAA >60  06/30/2020 1044    BNP    Component Value Date/Time   BNP 217.6 (H) 12/07/2022 1603   BNP 89 02/12/2021 1506    ProBNP    Component Value Date/Time   PROBNP 2,900 (H) 05/24/2022 1308   PROBNP 3,525.0 (H) 02/26/2022 1046     Imaging: No results found.       Latest Ref Rng & Units 05/29/2021   10:22 AM 04/22/2019    2:45 PM  PFT Results  FVC-Pre L 1.72  2.06   FVC-Predicted Pre % 76  80   FVC-Post L 1.92  2.15   FVC-Predicted Post % 85  83   Pre FEV1/FVC % % 74  75   Post FEV1/FCV % % 79  78   FEV1-Pre L 1.27  1.54   FEV1-Predicted Pre % 77  80   FEV1-Post L 1.51  1.67   DLCO uncorrected ml/min/mmHg 12.89  14.39   DLCO UNC% % 74  76   DLCO corrected ml/min/mmHg 14.17    DLCO COR %Predicted % 81    DLVA Predicted % 91  87   TLC L 4.47  6.39   TLC % Predicted % 94  126   RV % Predicted % 111  178     No results found for: "NITRICOXIDE"      Assessment & Plan:   No problem-specific Assessment & Plan notes found for this encounter.     Rexene Edison, NP 01/07/2023

## 2023-01-08 NOTE — Assessment & Plan Note (Signed)
Possible underlying asthma will repeat PFTs on return visit.  Intolerant to Symbicort in the past.  For now use albuterol as needed.  Control for triggers.  Plan  Patient Instructions  Albuterol inhaler As needed   Set up for HRCT Chest  Begin Oxygen 4l/m with activity and 3l/m At bedtime   Order for POC /or portable Oxygen  Follow up with GI as planned next week.  Follow up with Dr. Melvyn Novas  in 3-4 weeks with PFT and As needed   Please contact office for sooner follow up if symptoms do not improve or worsen or seek emergency care

## 2023-01-08 NOTE — Assessment & Plan Note (Signed)
Continue on oxygen to maintain O2 saturations greater than 88 to 9o%.  Order to DME for evaluation of best fit portable system

## 2023-01-08 NOTE — Assessment & Plan Note (Signed)
Chronic dyspnea with increased symptom burden over the last few months.  Questionable etiology.  Will check PFTs on return.  Set up for high-resolution CT chest to rule out underlying interstitial process.  Plan  Patient Instructions  Albuterol inhaler As needed   Set up for HRCT Chest  Begin Oxygen 4l/m with activity and 3l/m At bedtime   Order for POC /or portable Oxygen  Follow up with GI as planned next week.  Follow up with Dr. Melvyn Novas  in 3-4 weeks with PFT and As needed   Please contact office for sooner follow up if symptoms do not improve or worsen or seek emergency care

## 2023-01-08 NOTE — Assessment & Plan Note (Signed)
Continue follow up with GI

## 2023-01-09 DIAGNOSIS — I471 Supraventricular tachycardia, unspecified: Secondary | ICD-10-CM | POA: Diagnosis not present

## 2023-01-09 DIAGNOSIS — M0609 Rheumatoid arthritis without rheumatoid factor, multiple sites: Secondary | ICD-10-CM | POA: Diagnosis not present

## 2023-01-09 DIAGNOSIS — M1990 Unspecified osteoarthritis, unspecified site: Secondary | ICD-10-CM | POA: Diagnosis not present

## 2023-01-09 DIAGNOSIS — J4489 Other specified chronic obstructive pulmonary disease: Secondary | ICD-10-CM | POA: Diagnosis not present

## 2023-01-09 DIAGNOSIS — I6529 Occlusion and stenosis of unspecified carotid artery: Secondary | ICD-10-CM | POA: Diagnosis not present

## 2023-01-09 DIAGNOSIS — I2510 Atherosclerotic heart disease of native coronary artery without angina pectoris: Secondary | ICD-10-CM | POA: Diagnosis not present

## 2023-01-09 DIAGNOSIS — I083 Combined rheumatic disorders of mitral, aortic and tricuspid valves: Secondary | ICD-10-CM | POA: Diagnosis not present

## 2023-01-09 DIAGNOSIS — R1310 Dysphagia, unspecified: Secondary | ICD-10-CM | POA: Diagnosis not present

## 2023-01-09 DIAGNOSIS — I700 Atherosclerosis of aorta: Secondary | ICD-10-CM | POA: Diagnosis not present

## 2023-01-09 DIAGNOSIS — I5032 Chronic diastolic (congestive) heart failure: Secondary | ICD-10-CM | POA: Diagnosis not present

## 2023-01-09 DIAGNOSIS — G609 Hereditary and idiopathic neuropathy, unspecified: Secondary | ICD-10-CM | POA: Diagnosis not present

## 2023-01-09 DIAGNOSIS — I480 Paroxysmal atrial fibrillation: Secondary | ICD-10-CM | POA: Diagnosis not present

## 2023-01-09 DIAGNOSIS — K222 Esophageal obstruction: Secondary | ICD-10-CM | POA: Diagnosis not present

## 2023-01-09 DIAGNOSIS — I429 Cardiomyopathy, unspecified: Secondary | ICD-10-CM | POA: Diagnosis not present

## 2023-01-09 DIAGNOSIS — I110 Hypertensive heart disease with heart failure: Secondary | ICD-10-CM | POA: Diagnosis not present

## 2023-01-09 DIAGNOSIS — J9621 Acute and chronic respiratory failure with hypoxia: Secondary | ICD-10-CM | POA: Diagnosis not present

## 2023-01-15 DIAGNOSIS — I480 Paroxysmal atrial fibrillation: Secondary | ICD-10-CM | POA: Diagnosis not present

## 2023-01-15 DIAGNOSIS — I700 Atherosclerosis of aorta: Secondary | ICD-10-CM | POA: Diagnosis not present

## 2023-01-15 DIAGNOSIS — G609 Hereditary and idiopathic neuropathy, unspecified: Secondary | ICD-10-CM | POA: Diagnosis not present

## 2023-01-15 DIAGNOSIS — I429 Cardiomyopathy, unspecified: Secondary | ICD-10-CM | POA: Diagnosis not present

## 2023-01-15 DIAGNOSIS — I083 Combined rheumatic disorders of mitral, aortic and tricuspid valves: Secondary | ICD-10-CM | POA: Diagnosis not present

## 2023-01-15 DIAGNOSIS — M0609 Rheumatoid arthritis without rheumatoid factor, multiple sites: Secondary | ICD-10-CM | POA: Diagnosis not present

## 2023-01-15 DIAGNOSIS — I110 Hypertensive heart disease with heart failure: Secondary | ICD-10-CM | POA: Diagnosis not present

## 2023-01-15 DIAGNOSIS — M1990 Unspecified osteoarthritis, unspecified site: Secondary | ICD-10-CM | POA: Diagnosis not present

## 2023-01-15 DIAGNOSIS — I2510 Atherosclerotic heart disease of native coronary artery without angina pectoris: Secondary | ICD-10-CM | POA: Diagnosis not present

## 2023-01-15 DIAGNOSIS — J4489 Other specified chronic obstructive pulmonary disease: Secondary | ICD-10-CM | POA: Diagnosis not present

## 2023-01-15 DIAGNOSIS — K222 Esophageal obstruction: Secondary | ICD-10-CM | POA: Diagnosis not present

## 2023-01-15 DIAGNOSIS — I6529 Occlusion and stenosis of unspecified carotid artery: Secondary | ICD-10-CM | POA: Diagnosis not present

## 2023-01-15 DIAGNOSIS — J9621 Acute and chronic respiratory failure with hypoxia: Secondary | ICD-10-CM | POA: Diagnosis not present

## 2023-01-15 DIAGNOSIS — R1310 Dysphagia, unspecified: Secondary | ICD-10-CM | POA: Diagnosis not present

## 2023-01-15 DIAGNOSIS — I5032 Chronic diastolic (congestive) heart failure: Secondary | ICD-10-CM | POA: Diagnosis not present

## 2023-01-15 DIAGNOSIS — I471 Supraventricular tachycardia, unspecified: Secondary | ICD-10-CM | POA: Diagnosis not present

## 2023-01-16 ENCOUNTER — Other Ambulatory Visit (INDEPENDENT_AMBULATORY_CARE_PROVIDER_SITE_OTHER): Payer: Medicare Other

## 2023-01-16 ENCOUNTER — Ambulatory Visit: Payer: Medicare Other | Admitting: Internal Medicine

## 2023-01-16 ENCOUNTER — Encounter: Payer: Self-pay | Admitting: Internal Medicine

## 2023-01-16 VITALS — BP 116/60 | HR 80 | Ht 62.0 in | Wt 149.0 lb

## 2023-01-16 DIAGNOSIS — R1319 Other dysphagia: Secondary | ICD-10-CM | POA: Diagnosis not present

## 2023-01-16 DIAGNOSIS — K219 Gastro-esophageal reflux disease without esophagitis: Secondary | ICD-10-CM

## 2023-01-16 DIAGNOSIS — K222 Esophageal obstruction: Secondary | ICD-10-CM

## 2023-01-16 DIAGNOSIS — R112 Nausea with vomiting, unspecified: Secondary | ICD-10-CM

## 2023-01-16 DIAGNOSIS — R195 Other fecal abnormalities: Secondary | ICD-10-CM

## 2023-01-16 DIAGNOSIS — Z7901 Long term (current) use of anticoagulants: Secondary | ICD-10-CM

## 2023-01-16 LAB — CBC WITH DIFFERENTIAL/PLATELET
Basophils Absolute: 0.1 10*3/uL (ref 0.0–0.1)
Basophils Relative: 1.2 % (ref 0.0–3.0)
Eosinophils Absolute: 0.3 10*3/uL (ref 0.0–0.7)
Eosinophils Relative: 2.7 % (ref 0.0–5.0)
HCT: 36.5 % (ref 36.0–46.0)
Hemoglobin: 12.3 g/dL (ref 12.0–15.0)
Lymphocytes Relative: 17.5 % (ref 12.0–46.0)
Lymphs Abs: 1.8 10*3/uL (ref 0.7–4.0)
MCHC: 33.6 g/dL (ref 30.0–36.0)
MCV: 95.9 fl (ref 78.0–100.0)
Monocytes Absolute: 0.8 10*3/uL (ref 0.1–1.0)
Monocytes Relative: 7.8 % (ref 3.0–12.0)
Neutro Abs: 7.5 10*3/uL (ref 1.4–7.7)
Neutrophils Relative %: 70.8 % (ref 43.0–77.0)
Platelets: 327 10*3/uL (ref 150.0–400.0)
RBC: 3.8 Mil/uL — ABNORMAL LOW (ref 3.87–5.11)
RDW: 15.7 % — ABNORMAL HIGH (ref 11.5–15.5)
WBC: 10.5 10*3/uL (ref 4.0–10.5)

## 2023-01-16 MED ORDER — PANTOPRAZOLE SODIUM 40 MG PO TBEC: 40.0000 mg | DELAYED_RELEASE_TABLET | Freq: Two times a day (BID) | ORAL | 11 refills | Status: DC

## 2023-01-16 NOTE — Progress Notes (Signed)
HISTORY OF PRESENT ILLNESS:  Julie Rice is a 87 y.o. female with multiple significant medical problems as listed below.  She has been seen in this office for GERD, esophageal stricture, large symptomatic hiatal hernia requiring fundoplication, post fundoplication dysphagia secondary to dysmotility.  Previously treated with Botox injection and dilation with some success.  Last seen in this office November 2020.  Interval medical problems including atrial fibrillation for which she is now on Eliquis.  Had an MI last fall.  While in the hospital complained of significant dysphagia.  Abnormal esophagram stricturing at the level of the fundoplication.  Upper endoscopy with my partner.  Found to have significant esophagitis.  Treated with Botox only.  No dilation.  Sent for follow-up at this time..  Multiple interval records, x-rays, laboratories and procedures reviewed.  She accompanied by her friend  She tells me that she has significant dysphagia items such as bread and meat.  She will vomit approximately once per week.  1 week ago she vomited up dark material.  She also had dark stools just that day.  She has been taking omeprazole 20 mg daily for the past 6 months.  Complains that she has breakthrough reflux symptoms.  Blood work from December 31, 2022 shows hemoglobin 11.1.  Favorable compared to historical hemoglobins.  Iron studies normal with saturation 29%.  Ferritin 34.  Normal comprehensive metabolic panel.  Normal B12.  REVIEW OF SYSTEMS:  All non-GI ROS negative unless otherwise stated in the HPI except for arthritis  Past Medical History:  Diagnosis Date   A-fib ([place])    Anemia    Anxiety    Bipolar disorder ([place])    Blood transfusion 1991   autologous pts own blood given    CAD (coronary artery disease)    Cataract    Chest pain    "@ rest, lying down, w/exertion"   CHF (congestive heart failure) ([place]) 07/2020   Chronic back pain    "mostly lower back but I do have upper back  pain regularly" (04/06/2018)   COPD (chronic obstructive pulmonary disease) (H[facility])    Depression    Esophageal stricture    Fibromyalgia    GERD (gastroesophageal reflux disease)    Heart murmur    "slight" (04/06/2018)   Hiatal hernia    Hyperlipidemia    Patient denies   Hypertension    Internal hemorrhoids    Lumbago    Mitral regurgitation    Osteoarthritis    Osteoporosis    Pneumonia ~ 01/2018   PONV (postoperative nausea and vomiting)    severe ponv, "in the past" (04/06/2018)   Rectal bleeding    Rheumatoid arthritis ([place])    Spondylosis    TIA (transient ischemic attack) 2013   Urinary incontinence    wears depends    Past Surgical History:  Procedure Laterality Date   ABDOMINAL HYSTERECTOMY  1982   BACK SURGERY     BALLOON DILATION N/A 09/21/2018   Procedure: BALLOON DILATION;  Surgeon: Irene Shipper, MD;  Location: [facility];  Service: Endoscopy;  Laterality: N/A;   BALLOON DILATION N/A 08/12/2019   Procedure: BALLOON DILATION;  Surgeon: Jackquline Denmark, MD;  Location: [facility] ENDOSCOPY;  Service: Endoscopy;  Laterality: N/A;   BIOPSY  08/12/2019   Procedure: BIOPSY;  Surgeon: Jackquline Denmark, MD;  Location: [place];  Service: Endoscopy;;   BLADDER SUSPENSION  1980's   BOTOX INJECTION N/A 09/21/2018   Procedure: BOTOX INJECTION;  Surgeon: Irene Shipper, MD;  Location: [facility];  Service: Endoscopy;  Laterality: N/A;   BOTOX INJECTION N/A 08/12/2019   Procedure: BOTOX INJECTION;  Surgeon: Jackquline Denmark, MD;  Location: [facility] ENDOSCOPY;  Service: Endoscopy;  Laterality: N/A;   BOTOX INJECTION N/A 09/27/2022   Procedure: BOTOX INJECTION;  Surgeon: Doran Stabler, MD;  Location: [place];  Service: Gastroenterology;  Laterality: N/A;   CATARACT EXTRACTION W/ INTRAOCULAR LENS  IMPLANT, BILATERAL Bilateral 2010   DILATION AND CURETTAGE OF UTERUS  1961   ESOPHAGEAL MANOMETRY N/A 03/25/2018   Procedure: ESOPHAGEAL MANOMETRY (EM);  Surgeon: Mauri Pole, MD;   Location: [facility];  Service: Endoscopy;  Laterality: N/A;   ESOPHAGOGASTRODUODENOSCOPY (EGD) WITH PROPOFOL N/A 04/07/2018   Procedure: ESOPHAGOGASTRODUODENOSCOPY (EGD) WITH PROPOFOL;  Surgeon: Doran Stabler, MD;  Location: [place];  Service: Gastroenterology;  Laterality: N/A;   ESOPHAGOGASTRODUODENOSCOPY (EGD) WITH PROPOFOL N/A 09/21/2018   Procedure: ESOPHAGOGASTRODUODENOSCOPY (EGD) WITH PROPOFOL;  Surgeon: Irene Shipper, MD;  [facility];  Service: Endoscopy;  Laterality: N/A;   ESOPHAGOGASTRODUODENOSCOPY (EGD) WITH PROPOFOL N/A 08/12/2019   Procedure: ESOPHAGOGASTRODUODENOSCOPY (EGD) WITH PROPOFOL;  Surgeon: Jackquline Denmark, MD;  Location: [facility] ENDOSCOPY;  Service: Endoscopy;  Laterality: N/A;   ESOPHAGOGASTRODUODENOSCOPY (EGD) WITH PROPOFOL N/A 09/27/2022   Procedure: ESOPHAGOGASTRODUODENOSCOPY (EGD) WITH PROPOFOL;  Surgeon: Doran Stabler, MD;  Location: [place];  Service: Gastroenterology;  Laterality: N/A;   HERNIA REPAIR     HIATAL HERNIA REPAIR N/A 08/02/2016   Procedure: LAPAROSCOPIC REPAIR OF LARGE  HIATAL HERNIA;  Surgeon: Excell Seltzer, MD;  Location: [facility];  Service: General;  Laterality: N/A;   JOINT REPLACEMENT     LAPAROSCOPIC NISSEN FUNDOPLICATION N/A [id number]   Procedure: LAPAROSCOPIC NISSEN FUNDOPLICATION;  Surgeon: Excell Seltzer, MD;  Location: [facility];  Service: General;  Laterality: N/A;   [place]; [id number]   "I've got 2 stainless steel rods; 6 screws; 2 ray cages"took bone from right hip to put in back   RIGHT/LEFT HEART CATH AND CORONARY ANGIOGRAPHY N/A 07/26/2019   Procedure: RIGHT/LEFT HEART CATH AND CORONARY ANGIOGRAPHY;  Surgeon: Belva Crome, MD;  Location: [place] [facility] LAB;  Service: Cardiovascular;  Laterality: N/A;   SVT ABLATION N/A 04/25/2021   Procedure: SVT ABLATION;  Surgeon: Constance Haw, MD;  Location: [place] [facility];  Service: Cardiovascular;  Laterality: N/A;   TEE WITHOUT  CARDIOVERSION N/A 08/19/2019   Procedure: TRANSESOPHAGEAL ECHOCARDIOGRAM (TEE);  Surgeon: Lelon Perla, MD;  Location: [facility] ENDOSCOPY;  Service: Cardiovascular;  Laterality: N/A;   TONSILLECTOMY AND ADENOIDECTOMY  1945   TOTAL KNEE ARTHROPLASTY Right ~ [place]  ~ [place]  reports that she has never smoked. She has never used smokeless tobacco. She reports that she does not drink alcohol and does not use drugs.  family history includes Heart attack in her brother; Heart disease in her brother; Heart disease (age of onset: 30) in her father; Hypertension in her mother; Kidney disease in her mother; Other in her son; Suicidality in her son.  Allergies  Allergen Reactions   Tape Other (See Comments)    Band aides, adhesive tape Redness and pulls skin off   Tramadol Other (See Comments)    Pt has prolonged Qtc interval of 540- cannot give tramadol per pharmacy   Morphine Itching and Rash    Flushing       PHYSICAL EXAMINATION: Vital signs: BP 116/60   Pulse  80   Ht '5\' 2"'$  (1.575 m)   Wt 149 lb (67.6 kg)   SpO2 98%   BMI 27.25 kg/m   Constitutional: Pleasant elderly female, generally well-appearing, no acute distress Psychiatric: alert and oriented x3, cooperative Eyes: extraocular movements intact, anicteric, conjunctiva pink Mouth: oral pharynx moist, no lesions Neck: supple no lymphadenopathy Cardiovascular: heart regular rate and rhythm, no murmur Lungs: clear to auscultation bilaterally Abdomen: soft, nontender, nondistended, no obvious ascites, no peritoneal signs, normal bowel sounds, no organomegaly Rectal: Omitted Extremities: no clubbing or cyanosis.  Trace lower extremity edema bilaterally Skin: no lesions on visible extremities, though violaceous hue to the lower extremities bilaterally Neuro: No focal deficits.  No nerves intact  ASSESSMENT:   1.  GERD with history of peptic stricture large hiatal hernia and prior  fundoplication.   2.  Esophageal dysmotility, primary versus secondary 3.  Prior problems with dysphagia treated with a combination of Botox injection and balloon dilation of the esophagus with success.  Recent treatment with Botox alone, not helpful.  Currently having problems with significant dysphagia as described. 4.  Transient coffee-ground emesis and dark stools. 5.  Multiple general medical problems, including history of MI and A-fib on Eliquis     PLAN:   1.  Prescribe pantoprazole 40 mg p.o. twice daily.  This because of breakthrough reflux symptoms and significant esophagitis on recent endoscopy as well as problems with coffee-ground emesis 2.  Check CBC today with transient dark stools and coffee-ground emesis 3.  Schedule upper endoscopy with Botox injection and esophageal dilation via balloon.  At the hospital due to multiple comorbidities.  Patient is HIGH RISK due to her age and comorbidities.  She understands. 4.  Hold Eliquis 2 days prior to the procedure.  Anticipate resumption shortly post procedure 5.  Chew food well in the interim 6.  Ongoing general and specialty medical care with PCP and other specialists Total time 45 minutes was spent.  See the patient, reviewing a myriad of records and data, obtaining interval history, performing medically appropriate physical examination, counseling and educating the patient regarding the above listed issues, ordering medication, ordering advanced therapeutic endoscopic procedure, ordering laboratories, adjusting medication, and documenting clinical information in the health record

## 2023-01-16 NOTE — Patient Instructions (Addendum)
You have been scheduled for an endoscopy. Please follow written instructions given to you at your visit today. If you use inhalers (even only as needed), please bring them with you on the day of your procedure.   Hold your Eliquis for 2 days prior to procedure.  Your provider has requested that you go to the basement level for lab work before leaving today. Press "B" on the elevator. The lab is located at the first door on the left as you exit the elevator.  We have sent the following medications to your pharmacy for you to pick up at your convenience: Pantoprazole 40 mg twice daily  _______________________________________________________  If your blood pressure at your visit was 140/90 or greater, please contact your primary care physician to follow up on this.  _______________________________________________________  If you are age 35 or older, your body mass index should be between 23-30. Your Body mass index is 27.25 kg/m. If this is out of the aforementioned range listed, please consider follow up with your Primary Care Provider.  If you are age 45 or younger, your body mass index should be between 19-25. Your Body mass index is 27.25 kg/m. If this is out of the aformentioned range listed, please consider follow up with your Primary Care Provider.   ________________________________________________________  The [facility] providers would like to encourage you to use [facility] to communicate with providers for non-urgent requests or questions.  Due to long hold times on the telephone, sending your provider a message by [facility] may be a faster and more efficient way to get a response.  Please allow 48 business hours for a response.  Please remember that this is for non-urgent requests.  _______________________________________________________

## 2023-01-17 ENCOUNTER — Ambulatory Visit (INDEPENDENT_AMBULATORY_CARE_PROVIDER_SITE_OTHER): Payer: Medicare Other | Admitting: Internal Medicine

## 2023-01-17 DIAGNOSIS — R0609 Other forms of dyspnea: Secondary | ICD-10-CM

## 2023-01-17 LAB — PULMONARY FUNCTION TEST
DL/VA % pred: 91 %
DL/VA: 3.77 ml/min/mmHg/L
DLCO cor % pred: 78 %
DLCO cor: 13.47 ml/min/mmHg
DLCO unc % pred: 71 %
DLCO unc: 12.41 ml/min/mmHg
FEF 25-75 Post: 1.42 L/sec
FEF 25-75 Pre: 0.94 L/sec
FEF2575-%Change-Post: 51 %
FEF2575-%Pred-Post: 137 %
FEF2575-%Pred-Pre: 90 %
FEV1-%Change-Post: 10 %
FEV1-%Pred-Post: 103 %
FEV1-%Pred-Pre: 93 %
FEV1-Post: 1.63 L
FEV1-Pre: 1.48 L
FEV1FVC-%Change-Post: 3 %
FEV1FVC-%Pred-Pre: 97 %
FEV6-%Change-Post: 6 %
FEV6-%Pred-Post: 109 %
FEV6-%Pred-Pre: 103 %
FEV6-Post: 2.2 L
FEV6-Pre: 2.07 L
FEV6FVC-%Pred-Post: 106 %
FEV6FVC-%Pred-Pre: 106 %
FVC-%Change-Post: 6 %
FVC-%Pred-Post: 102 %
FVC-%Pred-Pre: 96 %
FVC-Post: 2.2 L
FVC-Pre: 2.07 L
Post FEV1/FVC ratio: 74 %
Post FEV6/FVC ratio: 100 %
Pre FEV1/FVC ratio: 71 %
Pre FEV6/FVC Ratio: 100 %
RV % pred: 100 %
RV: 2.4 L
TLC % pred: 97 %
TLC: 4.63 L

## 2023-01-17 NOTE — Patient Instructions (Signed)
Full PFT Performed Today  

## 2023-01-17 NOTE — Progress Notes (Signed)
Full PFT Performed Today  

## 2023-01-21 DIAGNOSIS — J9621 Acute and chronic respiratory failure with hypoxia: Secondary | ICD-10-CM | POA: Diagnosis not present

## 2023-01-21 DIAGNOSIS — J4489 Other specified chronic obstructive pulmonary disease: Secondary | ICD-10-CM | POA: Diagnosis not present

## 2023-01-21 DIAGNOSIS — M1990 Unspecified osteoarthritis, unspecified site: Secondary | ICD-10-CM | POA: Diagnosis not present

## 2023-01-21 DIAGNOSIS — I083 Combined rheumatic disorders of mitral, aortic and tricuspid valves: Secondary | ICD-10-CM | POA: Diagnosis not present

## 2023-01-21 DIAGNOSIS — I6529 Occlusion and stenosis of unspecified carotid artery: Secondary | ICD-10-CM | POA: Diagnosis not present

## 2023-01-21 DIAGNOSIS — I471 Supraventricular tachycardia, unspecified: Secondary | ICD-10-CM | POA: Diagnosis not present

## 2023-01-21 DIAGNOSIS — G609 Hereditary and idiopathic neuropathy, unspecified: Secondary | ICD-10-CM | POA: Diagnosis not present

## 2023-01-21 DIAGNOSIS — M0609 Rheumatoid arthritis without rheumatoid factor, multiple sites: Secondary | ICD-10-CM | POA: Diagnosis not present

## 2023-01-21 DIAGNOSIS — I480 Paroxysmal atrial fibrillation: Secondary | ICD-10-CM | POA: Diagnosis not present

## 2023-01-21 DIAGNOSIS — I2510 Atherosclerotic heart disease of native coronary artery without angina pectoris: Secondary | ICD-10-CM | POA: Diagnosis not present

## 2023-01-21 DIAGNOSIS — I110 Hypertensive heart disease with heart failure: Secondary | ICD-10-CM | POA: Diagnosis not present

## 2023-01-21 DIAGNOSIS — I5032 Chronic diastolic (congestive) heart failure: Secondary | ICD-10-CM | POA: Diagnosis not present

## 2023-01-21 DIAGNOSIS — I429 Cardiomyopathy, unspecified: Secondary | ICD-10-CM | POA: Diagnosis not present

## 2023-01-21 DIAGNOSIS — R1310 Dysphagia, unspecified: Secondary | ICD-10-CM | POA: Diagnosis not present

## 2023-01-21 DIAGNOSIS — I700 Atherosclerosis of aorta: Secondary | ICD-10-CM | POA: Diagnosis not present

## 2023-01-21 DIAGNOSIS — K222 Esophageal obstruction: Secondary | ICD-10-CM | POA: Diagnosis not present

## 2023-01-22 DIAGNOSIS — I083 Combined rheumatic disorders of mitral, aortic and tricuspid valves: Secondary | ICD-10-CM | POA: Diagnosis not present

## 2023-01-22 DIAGNOSIS — M1990 Unspecified osteoarthritis, unspecified site: Secondary | ICD-10-CM | POA: Diagnosis not present

## 2023-01-22 DIAGNOSIS — J4489 Other specified chronic obstructive pulmonary disease: Secondary | ICD-10-CM | POA: Diagnosis not present

## 2023-01-22 DIAGNOSIS — I5032 Chronic diastolic (congestive) heart failure: Secondary | ICD-10-CM | POA: Diagnosis not present

## 2023-01-22 DIAGNOSIS — I471 Supraventricular tachycardia, unspecified: Secondary | ICD-10-CM | POA: Diagnosis not present

## 2023-01-22 DIAGNOSIS — I110 Hypertensive heart disease with heart failure: Secondary | ICD-10-CM | POA: Diagnosis not present

## 2023-01-22 DIAGNOSIS — G609 Hereditary and idiopathic neuropathy, unspecified: Secondary | ICD-10-CM | POA: Diagnosis not present

## 2023-01-22 DIAGNOSIS — I429 Cardiomyopathy, unspecified: Secondary | ICD-10-CM | POA: Diagnosis not present

## 2023-01-22 DIAGNOSIS — I700 Atherosclerosis of aorta: Secondary | ICD-10-CM | POA: Diagnosis not present

## 2023-01-22 DIAGNOSIS — K222 Esophageal obstruction: Secondary | ICD-10-CM | POA: Diagnosis not present

## 2023-01-22 DIAGNOSIS — J9621 Acute and chronic respiratory failure with hypoxia: Secondary | ICD-10-CM | POA: Diagnosis not present

## 2023-01-22 DIAGNOSIS — M0609 Rheumatoid arthritis without rheumatoid factor, multiple sites: Secondary | ICD-10-CM | POA: Diagnosis not present

## 2023-01-22 DIAGNOSIS — I480 Paroxysmal atrial fibrillation: Secondary | ICD-10-CM | POA: Diagnosis not present

## 2023-01-22 DIAGNOSIS — R1310 Dysphagia, unspecified: Secondary | ICD-10-CM | POA: Diagnosis not present

## 2023-01-22 DIAGNOSIS — I2510 Atherosclerotic heart disease of native coronary artery without angina pectoris: Secondary | ICD-10-CM | POA: Diagnosis not present

## 2023-01-22 DIAGNOSIS — I6529 Occlusion and stenosis of unspecified carotid artery: Secondary | ICD-10-CM | POA: Diagnosis not present

## 2023-01-23 ENCOUNTER — Ambulatory Visit (INDEPENDENT_AMBULATORY_CARE_PROVIDER_SITE_OTHER): Payer: Medicare Other

## 2023-01-23 ENCOUNTER — Encounter: Payer: Self-pay | Admitting: Internal Medicine

## 2023-01-23 ENCOUNTER — Ambulatory Visit: Payer: Medicare Other | Admitting: Internal Medicine

## 2023-01-23 VITALS — BP 112/76 | HR 61 | Temp 97.5°F | Ht 62.5 in | Wt 153.0 lb

## 2023-01-23 DIAGNOSIS — R0600 Dyspnea, unspecified: Secondary | ICD-10-CM | POA: Diagnosis not present

## 2023-01-23 DIAGNOSIS — J439 Emphysema, unspecified: Secondary | ICD-10-CM | POA: Diagnosis not present

## 2023-01-23 DIAGNOSIS — R0609 Other forms of dyspnea: Secondary | ICD-10-CM

## 2023-01-23 NOTE — Patient Instructions (Signed)
Only use your albuterol as a rescue medication to be used if you can't catch your breath by resting or doing a relaxed purse lip breathing pattern.  - The less you use it, the better it will work when you need it. - Ok to use up to 2 puffs  every 4 hours if you must but call for immediate appointment if use goes up over your usual need - Don't leave home without it !!  (think of it like starter fluid for an engine)   Also  Ok to try albuterol 15 min before an activity (on alternating days)  that you know would usually make you short of breath and see if it makes any difference and if makes none then don't take albuterol after activity unless you can't catch your breath as this means it's the resting that helps, not the albuterol.      Make sure you check your oxygen saturation at your highest level of activity(NOT after you stop)  to be sure it stays over 90% and keep track of it at least once a week, more often if breathing getting worse, and let me know if losing ground. (Collect the dots to connect the dots approach)     Please remember to go to the  x-ray department  for your tests - we will call you with the results when they are available    Please schedule a follow up visit in 6  months but call sooner if needed

## 2023-01-23 NOTE — Assessment & Plan Note (Addendum)
PFTs 04/22/2019 min airflow obst  Chest CTa  08/17/2019 No evidence of aortic aneurysm or dissection. No evidence of pulmonary embolus. Moderate bilateral pleural effusions. Compressive atelectasis in the lower lobes.  Scattered reticulonodular densities and ground-glass nodular densities throughout the lungs are stable. Dilated, fluid-filled esophagus is stable. Echo 08/19/2019 1. Left ventricular ejection fraction, by visual estimation, is 25 to 30%. The left ventricle has severely decreased function. There is no left ventricular hypertrophy.  2. Global right ventricle has normal systolic function.The right ventricular size is normal.  3. Left atrial size was severely dilated.  4. Right atrial size was moderately dilated.  Amiodarone started 08/23/2019  - 09/07/2019   Walked RA x one lap =  approx 250 ft - stopped due to  Sob with sats 100% -  04/11/2021   Walked RA  2 laps @ approx 26ft each @ slow cane  pace  stopped due to end of study, no desats, no sob but"tired"  - PFT's  05/29/2021  FEV1 1.51 (91 % ) ratio 0.79  p 18 % improvement from saba p 0 prior to study with DLCO  12.89 (74%) corrects to 3.74 (90%)  for alv volume and FV curve min airflow obst and ERV  41%  @ wt  168   - PFT's  01/17/23   FEV1 1.63 (103 % ) ratio 0.74  p 10 % improvement from saba p 0 prior to study with DLCO  12.41 (71%)   and FV curve min concave  - 01/23/2023   Walked on RA   x  2 lap(s) =  approx 500  ft  @ slow/cane pace, stopped due to knee pain s sob  with lowest 02 sats 97%   Overall beatter since diueresis and  appears now to be related to orthopedic issues/ deconditioning >>> limited doe though can't r/o small asthmatic component so rec   Re SABA :  I spent extra time with pt today reviewing appropriate use of albuterol for prn use on exertion with the following points: 1) saba is for relief of sob that does not improve by walking a slower pace or resting but rather if the pt does not improve after trying this  first. 2) If the pt is convinced, as many are, that saba helps recover from activity faster then it's easy to tell if this is the case by re-challenging : ie stop, take the inhaler, then p 5 minutes try the exact same activity (intensity of workload) that just caused the symptoms and see if they are substantially diminished or not after saba 3) if there is an activity that reproducibly causes the symptoms, try the saba 15 min before the activity on alternate days   If in fact the saba really does help, then fine to continue to use it prn but advised may need to look closer at the maintenance regimen being used to achieve better control of airways disease with exertion.   Make sure you check your oxygen saturation at your highest level of activity(NOT after you stop)  to be sure it stays over 90% and keep track of it at least once a week, more often if breathing getting worse, and let me know if losing ground. (Collect the dots to connect the dots approach)    Pulmonary f/u can be q 6 m and in meantime prn   Each maintenance medication was reviewed in detail including emphasizing most importantly the difference between maintenance and prns and under what circumstances the  prns are to be triggered using an action plan format where appropriate.  Total time for H and P, chart review, counseling, reviewing hfa device(s) , directly observing portions of ambulatory 02 saturation study/ and generating customized AVS unique to this office visit / same day charting  > 30 min for   refractory respiratory  symptoms of uncertain etiology

## 2023-01-23 NOTE — Progress Notes (Signed)
Subjective:     Patient ID: Julie Rice, female   DOB: 04/09/1936,   MRN: [id number]    Brief patient profile:  15  yowf never smoker dx RA followed by Julie Rice on nsaids with new doe around 2000 eval by Dr Patsey Berthold: inhaler helped some but never back to baseline then rx by [facility] 08/02/16 LAPAROSCOPIC REPAIR OF LARGE  HIATAL HERNIA LAPAROSCOPIC NISSEN FUNDOPLICATION >>  and dysphagia ever since with eval Julie Rice 02/19/17 EGD dilated benign esoph stenosis and improved but  new cough /gag/vomit around Nov 04 2017 then abrupt pain under R breast x around 12/20/17 then cxr 12/22/17 c/w pna with cavitary changes on CT 12/25/17 so referred to pulmonary clinic 12/31/2017 by Dr  [name] p rx with augmentin starting 12/25/17 for possible lung abscess.     History of Present Illness  12/31/2017 1st [facility] office visit/ Ladye Macnaughton   Chief Complaint  Patient presents with   Pulmonary Consult    Referred by Dr. Burnice Logan. Pt c/o cough since Jan 2019. She states she has had SOB off and on for the past several yrs. She was recently dxed with PNA. Her cough is non prod and worse at night.   since abx started R side CP  improved, cough still a problem > beige esp at hs / still gag/ vomit with overt HB not on ppi daily ac Lower molar R dental work one month prior to onset of cp Sleeps on L side typically not on back  rec Augmentin 875 mg take one pill twice daily  X 10 days   Protonix 20 mg x 2 (40 mg called in)   Take 30- 60 min before your first and last meals of the day  GERD  diet Take delsym two tsp every 12 hours and supplement if needed with  tramadol 50 mg up to 1 every 4 hours     01/12/2018  f/u ov/[name] re: lung abscess  "I'm no better at all"  Chief Complaint  Patient presents with   Follow-up    SOB with activity and at rest, non-productive cough, chest pain bilateral sides behind breast,   Dyspnea:  No better Cough: no sputum production  Sleep: tramadol works well / only taking twice  daily (rec was for up to 1 q4)  Cp now 2/10 only feels it with deep breath  rec Take delsym two tsp every 12 hours and supplement if needed with  tramadol 50 mg up to 1 every 4 hours to suppress the urge to cough. Swallowing water or using ice chips/non mint and menthol containing candies (such as lifesavers or sugarless jolly ranchers) are also effective.  You should rest your voice and avoid activities that you know make you cough. Once you have eliminated the cough for 3 straight days try reducing the tramadol first,  then the delsym as tolerated. Please remember to go to the  x-ray department downstairs in the basement  for your tests - we will call you with the results when they are available. Clindamycin 150 mg four times a day x 10 days and then return with cxr      Add:  Changed clinda to 300 mg tid and advised by phone     02/05/2018  f/u ov/[name] re: final f/u re lung abscess from ? Asp pna  Chief Complaint  Patient presents with   Follow-up    ER VISIT 3.31.19, NON PRODUCTIVE COUGH  R cp leveled off  Now can barely  feel it with the deepest breath Sob the same  At rest and worse walking  Cough is improved but still waking up with it and also some daytime but dry  N and V ? From clindamycin  Coughed so hard on 01/30/18 started to hurt L chest > to ER 02/01/18  New med from ER = zofran > eating and drinking now  Last clindamycin about a week prior to OV  rec Please see Dr Julie Rice as soon as possible as I suspect your swallowing problem caused your lung problem which is slowly healing Stop your clindamycin  You will need a follow up cxr in 3 months - fine to let Dr [name] do this  - return here if cough or pain with breathing gets worse with all active medications in hand including the over the counter ones     Admit date: 08/16/2019 Discharge date: 08/24/2019   Recommendations for Outpatient Follow-up:  Eat only sitting up at 90 degrees. Follow up with PCP in 7-10 days. Chemistry to  be drawn in 7 days and reported to PCP. Patient to follow up with [name] within 2 weeks. Patient to follow up with [name] within one month.     Discharge Diagnoses: Principal diagnosis is #1 Atrial fibrillation with RVR Chest Pain Intractable nausea and vomiting Elevated troponin Hypothyroidism Dyslipidemia Anemia Hypertension GERD Prolonged QT interval History of systolic CHF Asthma   Discharge Condition: Fair   Disposition: Home   Diet recommendation: regular        Filed Weights    08/20/19 0410 08/21/19 0443 08/22/19 0559  Weight: 65.3 kg 64.6 kg 64.7 kg      History of present illness:  Ms. Rice looks to have been seen very remotely (1990's) by [name] and not since then.  Recently referred back for some reports of CP and palpitations, though presented  to the hospital last month prior  To her appointment date.  She was noted to have new reduced LVEF 40%, CHF, was in SR w/PVCs.  Cardiology noted she had a 4 day monitor via her PMD noting intermittent runs of SVT with the longest being 13 beats.  She was diuresed, underwent R/LHC Widely patent coronary arteries, Low right heart and LV filling pressures During her stay she had an SVT that responded to adenosine She was discharged 07/27/2019   She was re-admitted 08/07/2019 with SOB, hypoxic resp failure, found with aspiration pneumonia. Barium swallow study showed significant narrowing of the upper part of the esophagus but no esophageal stricture.  Underwent EGD with finding of benign-appearing esophageal stenosis. Status post fundoplication, Botox injection, balloon dilation.  I do not see that she had any cardiac issues during this hospitalization   Admitted to [facility] this admission 08/16/2019 with c/o on/off CP, HHRN noted pulse irregular, ranging 60-140 and recommended she go to the ER.  She was found in AFib w/RVR New echo this admission noted EF up some to 40-45%, initially placed on dilt though changed to BB,  and heparin gtt for a/c Looks like she is planned for TEE/DCCV, though ? If pending GI 1st given her history and ongoing nausea, as well as any anticoagulation recommendations, concerns, for final anticoagulation plan   EP is asked to weigh in on AAD options, noting severely dilated LA, likely to need drug to help maintain SR.  Rhythm control preferred strategy with reduced LVEF ad recent CHF exacerbation.   Hospital Course:  87 y.o. female with medical history significant of  hypertension, hyperlipidemia, systolic CHF, fibromyalgia, anemia, esophageal stricture s/p balloon dilation, large symptomatic hiatal hernia status post fundoplication, chronic dysphagia, asthma/COPD on oxygen at 2 L at home, CAD, TIA, bipolar disorder fibromyalgia, rheumatoid arthritis, TIA, chronic urinary incontinence.     She presented with heart rate 60s-140 at home per home health nurse.  Patient has not filled her medications since her discharge with still having some intermittent chest pain and she was told to come to emergency department.  She reports while hospitalized she has an episode of tachycardia with HR p to 170's she was given a medicine and then her HR has come down.  She has no known hx of A.fib  She took 2 nitro and 3/24 of aspirin and presented to emergency department.  Chest pain report on and off for weeks, is worse with swallowing.  Has been swallowing soft food ok.  Reported burning pain in her chest radiating to her back and arms.  She felt very weak and fell back wards no LOC.  No fever no chills.  Last night she felt sweaty her temp was 99.9.  Reports Shortness of breath since last summer but has been getting progressively worse.     Relative recent history: Initially admitted on 10/3/20200 with shortness of breath and chest pain was found to have lower lobe pneumonia thought to be secondary to aspiration COVID negative treated with broad-spectrum antibiotics there was worrisome for esophageal stenosis  GI was consulted and she underwent balloon dilatation and Botox injection Physical therapy seen her and recommended home health and discharge and she was discharged home on 9 October.   EKG showed no evidence of ischemic changes thought to be secondary to demand ischemia.       04/11/2021  f/u ov/Charita Lindenberger re: chf/ uacs  Still on amiodarone no longer entrestro Chief Complaint  Patient presents with   Shortness of Breath    Dyspnea for several weeks; patient states cardiac ablation scheduled soon and here for pulmonary clearance  Dyspnea: mb and back / flat stops on return with sats around 91-92% which is an improvement  Cough: none  Sleeping: on L side on 30 degrees wedge  SABA use: seems to need in am 4 x weekly  02: 2lpm hs  Covid status:    JJ Only use your albuterol as a rescue medication   Also ok to Try albuterol 15 min before an activity (on alternating days)  that you know would make you short of breath    05/29/2021  f/u ov/Kiannah Grunow re: doe with small asthma component Chief Complaint  Patient presents with   Shortness of Breath    Describes mild dyspnea with walking to her mailbox and some bilateral ankle edema. Reports using lasix as directed and has follow up with cardiologist next week.    Dyspnea:  still doing mb and back  Cough: none Sleeping: 30 degree wedge  SABA use: albuterol p rising most morning  02: 2lpm hs prn  Rec Only use your albuterol inhaler as a rescue medication  Also ok to Try albuterol 15 min before an activity (on alternating days)  that you know usually  would make you short of breath  Pulmonary follow up is as needed    01/23/2023 re establish ov/Shaddai Shapley re: doe/ asthma  maint on no resp rx   Chief Complaint  Patient presents with   Follow-up    SOB seems worse qam.  Dyspnea throughout the day.  PFT done 01/17/2023  Dyspnea:  stops midway to mb to rest/ slt incline to mb but stops on way back as well which is downhill   Cough: none  Sleeping: wedge x  30 degrees  SABA use: once a day in am  02: 2.5 hs prn      No obvious day to day or daytime variability or assoc excess/ purulent sputum or mucus plugs or hemoptysis or cp or chest tightness, subjective wheeze or overt sinus or hb symptoms.   Sleeping ok  without nocturnal  or early am exacerbation  of respiratory  c/o's or need for noct saba. Also denies any obvious fluctuation of symptoms with weather or environmental changes or other aggravating or alleviating factors except as outlined above   No unusual exposure hx or h/o childhood pna/ asthma or knowledge of premature birth.  Current Allergies, Complete Past Medical History, Past Surgical History, Family History, and Social History were reviewed in [employer] record.  ROS  The following are not active complaints unless bolded Hoarseness, sore throat, dysphagia, dental problems, itching, sneezing,  nasal congestion or discharge of excess mucus or purulent secretions, ear ache,   fever, chills, sweats, unintended wt loss or wt gain, classically pleuritic or exertional cp,  orthopnea pnd or arm/hand swelling  or leg swelling, presyncope, palpitations, abdominal pain, anorexia, nausea, vomiting, diarrhea  or change in bowel habits or change in bladder habits, change in stools or change in urine, dysuria, hematuria,  rash, arthralgias, visual complaints, headache, numbness, weakness or ataxia or problems with walking or coordination/uses cane ,  change in mood or  memory.        Current Meds  Medication Sig   acetaminophen (TYLENOL) 500 MG tablet Take 1,000 mg by mouth every 6 (six) hours as needed for moderate pain, headache or fever.   albuterol (VENTOLIN HFA) 108 (90 Base) MCG/ACT inhaler TAKE TWO PUFFS BY MOUTH EVERY 6 HOURS AS NEEDED FOR WHEEZING OR SHORTNESS OF BREATH   amiodarone (PACERONE) 200 MG tablet Take 1 tablet (200 mg total) by mouth daily.   buPROPion (WELLBUTRIN XL) 300 MG 24 hr tablet TAKE 1 TABLET  BY MOUTH EVERY DAY   Carboxymethylcellulose Sodium (REFRESH TEARS OP) Place 1 drop into both eyes 2 (two) times daily.   cetirizine (ZYRTEC) 10 MG tablet Take 10 mg by mouth daily as needed for allergies.   clonazePAM (KLONOPIN) 0.5 MG tablet Take 1 tablet (0.5 mg total) by mouth 2 (two) times daily.   DULoxetine (CYMBALTA) 60 MG capsule TAKE 1 CAPSULE BY MOUTH EVERY DAY (Patient taking differently: Take 60 mg by mouth daily.)   ELIQUIS 5 MG TABS tablet TAKE 1 TABLET BY MOUTH TWICE A DAY (Patient taking differently: Take 5 mg by mouth 2 (two) times daily.)   FeFum-FePoly-FA-B Cmp-C-Biot (FOLIVANE-PLUS) CAPS TAKE 1 CAPSULE BY MOUTH EVERY DAY IN THE MORNING (Patient taking differently: Take 1 capsule by mouth daily.)   KLOR-CON M20 20 MEQ tablet TAKE 1 TABLET BY MOUTH EVERY DAY   losartan (COZAAR) 50 MG tablet TAKE 1 TABLET BY MOUTH EVERY DAY   metoprolol tartrate (LOPRESSOR) 25 MG tablet Take 0.5 tablets (12.5 mg total) by mouth 2 (two) times daily.   Multiple Vitamins-Minerals (ONE-A-DAY WOMENS 50+) TABS Take 1 tablet by mouth daily.   omeprazole (PRILOSEC) 20 MG capsule Take 20 mg by mouth daily as needed (for heartburn).   pantoprazole (PROTONIX) 40 MG tablet Take 1 tablet (40 mg total) by mouth 2 (two) times daily.   rosuvastatin (CRESTOR)  20 MG tablet Take 1 tablet (20 mg total) by mouth daily at 6 PM.   sodium chloride (OCEAN) 0.65 % SOLN nasal spray Place 1 spray into both nostrils as needed for congestion.                   Objective:   Physical Exam  Wts  01/23/2023      153  05/29/2021       168  04/11/2021        162  10/19/2019     140   09/07/2019        136  02/05/2018         152  01/12/2018        159   12/31/17 160 lb 9.6 oz (72.8 kg)  12/29/17 162 lb (73.5 kg)  12/22/17 164 lb 4 oz (74.5 kg)      Vital signs reviewed  01/23/2023  - Note at rest 02 sats  98% on RA   General appearance:    amb (with cane) elderly wf nad     HEENT : Oropharynx  clear    NECK :   without  apparent JVD/ palpable Nodes/TM    LUNGS: no acc muscle use,  Nl contour chest which is clear to A and P bilaterally without cough on insp or exp maneuvers   CV:  RRR  no s3 or murmur or increase in P2, and no edema   ABD:  soft and nontender with nl inspiratory excursion in the supine position. No bruits or organomegaly appreciated   MS:  Nl gait/ ext warm without deformities Or obvious joint restrictions  calf tenderness, cyanosis or clubbing    SKIN: warm and dry without lesions    NEURO:  alert, approp, nl sensorium with  no motor or cerebellar deficits apparent.        CXR PA and Lateral:   01/23/2023 :    I personally reviewed images and impression is as follows:     Mild CM s chf/ild         Assessment:

## 2023-01-28 ENCOUNTER — Other Ambulatory Visit: Payer: Self-pay | Admitting: Adult Health

## 2023-01-28 DIAGNOSIS — M0609 Rheumatoid arthritis without rheumatoid factor, multiple sites: Secondary | ICD-10-CM | POA: Diagnosis not present

## 2023-01-28 DIAGNOSIS — I471 Supraventricular tachycardia, unspecified: Secondary | ICD-10-CM | POA: Diagnosis not present

## 2023-01-28 DIAGNOSIS — I083 Combined rheumatic disorders of mitral, aortic and tricuspid valves: Secondary | ICD-10-CM | POA: Diagnosis not present

## 2023-01-28 DIAGNOSIS — M1990 Unspecified osteoarthritis, unspecified site: Secondary | ICD-10-CM | POA: Diagnosis not present

## 2023-01-28 DIAGNOSIS — I429 Cardiomyopathy, unspecified: Secondary | ICD-10-CM | POA: Diagnosis not present

## 2023-01-28 DIAGNOSIS — I6529 Occlusion and stenosis of unspecified carotid artery: Secondary | ICD-10-CM | POA: Diagnosis not present

## 2023-01-28 DIAGNOSIS — I480 Paroxysmal atrial fibrillation: Secondary | ICD-10-CM | POA: Diagnosis not present

## 2023-01-28 DIAGNOSIS — J4489 Other specified chronic obstructive pulmonary disease: Secondary | ICD-10-CM | POA: Diagnosis not present

## 2023-01-28 DIAGNOSIS — K222 Esophageal obstruction: Secondary | ICD-10-CM | POA: Diagnosis not present

## 2023-01-28 DIAGNOSIS — R1310 Dysphagia, unspecified: Secondary | ICD-10-CM | POA: Diagnosis not present

## 2023-01-28 DIAGNOSIS — I2510 Atherosclerotic heart disease of native coronary artery without angina pectoris: Secondary | ICD-10-CM | POA: Diagnosis not present

## 2023-01-28 DIAGNOSIS — J9621 Acute and chronic respiratory failure with hypoxia: Secondary | ICD-10-CM | POA: Diagnosis not present

## 2023-01-28 DIAGNOSIS — G609 Hereditary and idiopathic neuropathy, unspecified: Secondary | ICD-10-CM | POA: Diagnosis not present

## 2023-01-28 DIAGNOSIS — I700 Atherosclerosis of aorta: Secondary | ICD-10-CM | POA: Diagnosis not present

## 2023-01-28 DIAGNOSIS — F419 Anxiety disorder, unspecified: Secondary | ICD-10-CM

## 2023-01-28 DIAGNOSIS — I110 Hypertensive heart disease with heart failure: Secondary | ICD-10-CM | POA: Diagnosis not present

## 2023-01-28 DIAGNOSIS — I5032 Chronic diastolic (congestive) heart failure: Secondary | ICD-10-CM | POA: Diagnosis not present

## 2023-01-28 MED ORDER — CLONAZEPAM 0.5 MG PO TABS: 0.5000 mg | ORAL_TABLET | Freq: Two times a day (BID) | ORAL | 2 refills | Status: DC

## 2023-01-28 NOTE — Telephone Encounter (Signed)
[facility] is calling and pt need clonazePAM (KLONOPIN) 0.5 MG tablet send to new [facility] - [address] [place], [place] - [address] Phone: [phone]  Fax: [phone]

## 2023-01-30 ENCOUNTER — Other Ambulatory Visit: Payer: Self-pay | Admitting: Adult Health

## 2023-01-31 DIAGNOSIS — I6529 Occlusion and stenosis of unspecified carotid artery: Secondary | ICD-10-CM | POA: Diagnosis not present

## 2023-01-31 DIAGNOSIS — K222 Esophageal obstruction: Secondary | ICD-10-CM | POA: Diagnosis not present

## 2023-01-31 DIAGNOSIS — I5032 Chronic diastolic (congestive) heart failure: Secondary | ICD-10-CM | POA: Diagnosis not present

## 2023-01-31 DIAGNOSIS — I083 Combined rheumatic disorders of mitral, aortic and tricuspid valves: Secondary | ICD-10-CM | POA: Diagnosis not present

## 2023-01-31 DIAGNOSIS — R1310 Dysphagia, unspecified: Secondary | ICD-10-CM | POA: Diagnosis not present

## 2023-01-31 DIAGNOSIS — I471 Supraventricular tachycardia, unspecified: Secondary | ICD-10-CM | POA: Diagnosis not present

## 2023-01-31 DIAGNOSIS — G609 Hereditary and idiopathic neuropathy, unspecified: Secondary | ICD-10-CM | POA: Diagnosis not present

## 2023-01-31 DIAGNOSIS — I429 Cardiomyopathy, unspecified: Secondary | ICD-10-CM | POA: Diagnosis not present

## 2023-01-31 DIAGNOSIS — I480 Paroxysmal atrial fibrillation: Secondary | ICD-10-CM | POA: Diagnosis not present

## 2023-01-31 DIAGNOSIS — J4489 Other specified chronic obstructive pulmonary disease: Secondary | ICD-10-CM | POA: Diagnosis not present

## 2023-01-31 DIAGNOSIS — I700 Atherosclerosis of aorta: Secondary | ICD-10-CM | POA: Diagnosis not present

## 2023-01-31 DIAGNOSIS — M0609 Rheumatoid arthritis without rheumatoid factor, multiple sites: Secondary | ICD-10-CM | POA: Diagnosis not present

## 2023-01-31 DIAGNOSIS — J9621 Acute and chronic respiratory failure with hypoxia: Secondary | ICD-10-CM | POA: Diagnosis not present

## 2023-01-31 DIAGNOSIS — I2510 Atherosclerotic heart disease of native coronary artery without angina pectoris: Secondary | ICD-10-CM | POA: Diagnosis not present

## 2023-01-31 DIAGNOSIS — M1990 Unspecified osteoarthritis, unspecified site: Secondary | ICD-10-CM | POA: Diagnosis not present

## 2023-01-31 DIAGNOSIS — I110 Hypertensive heart disease with heart failure: Secondary | ICD-10-CM | POA: Diagnosis not present

## 2023-02-03 ENCOUNTER — Telehealth: Payer: Self-pay | Admitting: Adult Health

## 2023-02-03 ENCOUNTER — Telehealth: Payer: Self-pay | Admitting: Internal Medicine

## 2023-02-03 DIAGNOSIS — I110 Hypertensive heart disease with heart failure: Secondary | ICD-10-CM | POA: Diagnosis not present

## 2023-02-03 DIAGNOSIS — M0609 Rheumatoid arthritis without rheumatoid factor, multiple sites: Secondary | ICD-10-CM | POA: Diagnosis not present

## 2023-02-03 DIAGNOSIS — M1990 Unspecified osteoarthritis, unspecified site: Secondary | ICD-10-CM | POA: Diagnosis not present

## 2023-02-03 DIAGNOSIS — I700 Atherosclerosis of aorta: Secondary | ICD-10-CM | POA: Diagnosis not present

## 2023-02-03 DIAGNOSIS — I480 Paroxysmal atrial fibrillation: Secondary | ICD-10-CM | POA: Diagnosis not present

## 2023-02-03 DIAGNOSIS — R1310 Dysphagia, unspecified: Secondary | ICD-10-CM | POA: Diagnosis not present

## 2023-02-03 DIAGNOSIS — K222 Esophageal obstruction: Secondary | ICD-10-CM | POA: Diagnosis not present

## 2023-02-03 DIAGNOSIS — I5032 Chronic diastolic (congestive) heart failure: Secondary | ICD-10-CM | POA: Diagnosis not present

## 2023-02-03 DIAGNOSIS — I2510 Atherosclerotic heart disease of native coronary artery without angina pectoris: Secondary | ICD-10-CM | POA: Diagnosis not present

## 2023-02-03 DIAGNOSIS — G609 Hereditary and idiopathic neuropathy, unspecified: Secondary | ICD-10-CM | POA: Diagnosis not present

## 2023-02-03 DIAGNOSIS — J4489 Other specified chronic obstructive pulmonary disease: Secondary | ICD-10-CM | POA: Diagnosis not present

## 2023-02-03 DIAGNOSIS — I083 Combined rheumatic disorders of mitral, aortic and tricuspid valves: Secondary | ICD-10-CM | POA: Diagnosis not present

## 2023-02-03 DIAGNOSIS — I471 Supraventricular tachycardia, unspecified: Secondary | ICD-10-CM | POA: Diagnosis not present

## 2023-02-03 DIAGNOSIS — J9621 Acute and chronic respiratory failure with hypoxia: Secondary | ICD-10-CM | POA: Diagnosis not present

## 2023-02-03 DIAGNOSIS — I429 Cardiomyopathy, unspecified: Secondary | ICD-10-CM | POA: Diagnosis not present

## 2023-02-03 DIAGNOSIS — I6529 Occlusion and stenosis of unspecified carotid artery: Secondary | ICD-10-CM | POA: Diagnosis not present

## 2023-02-03 NOTE — Telephone Encounter (Signed)
Received a call from [facility], occupational therapist stating that pt's portable oxygen concentrator is not working and pt is not able to take this to DME for them to look at and also pt has not had her oxygen until 3/29.  Without oxygen when pt gets up, sats will drop to the 70s.  Pt does not drive and has no way to get this to [employer] and was told that they would not come out to the house to take a look at it.  Stated to [facility] that I would call [employer] to see if they could make an exception to have someone come out to pt's house to take a look at her oxygen concentrator since pt does not drive and she verbalized understanding.  Tried to call [name] with [employer] but unable to reach. Left message for her to return call.

## 2023-02-03 NOTE — Telephone Encounter (Signed)
[name] OT with [facility] is calling and she is aware cory not in office today however she need someone to call her back today to go over the medication list

## 2023-02-04 ENCOUNTER — Telehealth: Payer: Self-pay | Admitting: Adult Health

## 2023-02-04 NOTE — Telephone Encounter (Signed)
Sent a community message to [employer].

## 2023-02-04 NOTE — Telephone Encounter (Signed)
Called back today requesting a call to verify medication

## 2023-02-04 NOTE — Telephone Encounter (Signed)
Verbal orders given to [name] 

## 2023-02-04 NOTE — Telephone Encounter (Signed)
Patient notified of update  and verbalized understanding. 

## 2023-02-04 NOTE — Telephone Encounter (Addendum)
[name] - OT with [facility]  [phone] [facility] to leave a detailed message on this line)   Verbal Order Requesting to add on RN eval for medication clarification   Also, please fax an updated Med List to:   [phone]  - or - [phone]

## 2023-02-04 NOTE — Telephone Encounter (Signed)
Okay for verbal orders? Please advise 

## 2023-02-04 NOTE — Telephone Encounter (Signed)
[name] with [employer] returned call. I relayed the message to her from [place] with OT.  Per Lenna Sciara, there seems to be some confusion internally and she said she would reach out to her team to see if they can get one of their techs to go out to pt's house to trouble shoot pt's machine.   Called [name] with OT and let her know the info per my conversation with [name] and she verbalized understanding.

## 2023-02-05 ENCOUNTER — Other Ambulatory Visit: Payer: Self-pay

## 2023-02-05 DIAGNOSIS — I6529 Occlusion and stenosis of unspecified carotid artery: Secondary | ICD-10-CM | POA: Diagnosis not present

## 2023-02-05 DIAGNOSIS — I429 Cardiomyopathy, unspecified: Secondary | ICD-10-CM | POA: Diagnosis not present

## 2023-02-05 DIAGNOSIS — I471 Supraventricular tachycardia, unspecified: Secondary | ICD-10-CM | POA: Diagnosis not present

## 2023-02-05 DIAGNOSIS — M0609 Rheumatoid arthritis without rheumatoid factor, multiple sites: Secondary | ICD-10-CM | POA: Diagnosis not present

## 2023-02-05 DIAGNOSIS — R1310 Dysphagia, unspecified: Secondary | ICD-10-CM | POA: Diagnosis not present

## 2023-02-05 DIAGNOSIS — M1990 Unspecified osteoarthritis, unspecified site: Secondary | ICD-10-CM | POA: Diagnosis not present

## 2023-02-05 DIAGNOSIS — I700 Atherosclerosis of aorta: Secondary | ICD-10-CM | POA: Diagnosis not present

## 2023-02-05 DIAGNOSIS — I5032 Chronic diastolic (congestive) heart failure: Secondary | ICD-10-CM | POA: Diagnosis not present

## 2023-02-05 DIAGNOSIS — J4489 Other specified chronic obstructive pulmonary disease: Secondary | ICD-10-CM | POA: Diagnosis not present

## 2023-02-05 DIAGNOSIS — I2510 Atherosclerotic heart disease of native coronary artery without angina pectoris: Secondary | ICD-10-CM | POA: Diagnosis not present

## 2023-02-05 DIAGNOSIS — G609 Hereditary and idiopathic neuropathy, unspecified: Secondary | ICD-10-CM | POA: Diagnosis not present

## 2023-02-05 DIAGNOSIS — J9621 Acute and chronic respiratory failure with hypoxia: Secondary | ICD-10-CM | POA: Diagnosis not present

## 2023-02-05 DIAGNOSIS — K222 Esophageal obstruction: Secondary | ICD-10-CM | POA: Diagnosis not present

## 2023-02-05 DIAGNOSIS — I480 Paroxysmal atrial fibrillation: Secondary | ICD-10-CM | POA: Diagnosis not present

## 2023-02-05 DIAGNOSIS — I083 Combined rheumatic disorders of mitral, aortic and tricuspid valves: Secondary | ICD-10-CM | POA: Diagnosis not present

## 2023-02-05 DIAGNOSIS — I110 Hypertensive heart disease with heart failure: Secondary | ICD-10-CM | POA: Diagnosis not present

## 2023-02-05 MED ORDER — CETIRIZINE HCL 10 MG PO TABS: 10.0000 mg | ORAL_TABLET | Freq: Every day | ORAL | 0 refills | Status: DC | PRN

## 2023-02-05 NOTE — Telephone Encounter (Signed)
Spoke to [place] and she stated that pt is not taking medications as prescribed. [name] also stated that pt was using the pill pack service and taking older medications with it. She also articulated that pt did not look like she was in good health. According [name] pt is taking medications as prescribed below:  Spironolactone- Not on pt med list  Pt taking Omeprazole ( not on pt med list) and pantoprazole (20 mg not 40mg )together   Not taking albuterol,amiodarone(not taking in 3 weeks), rosuvastatin and Cymbalta  Pt is taking Tylenol 500 mg TID daily and advised this was suppose to be PRN  Pt is taking Metoprolol 50 mg 0.5 tablets BID instead of 25 mg 0.5 tablets BID   Pt also has been without O2 for a week and claimed oxygen machine was broken- pulmonary advised of this per [name].   [name] notified verbally and stated that pt need to take all medications as prescribed on med list. Called [name] back but no answer. Willcall again to make sure she was able to get faxed med list.

## 2023-02-05 NOTE — Telephone Encounter (Signed)
Julie Rice from [facility] call and stated she need verbal orders for nursing 1 x a wk for 4 wk's and 1 X time a wk for every 2 wk's also need Clarification for her medication Metoprolol and Spirolactone .Becky Sax 's # is [phone].

## 2023-02-05 NOTE — Telephone Encounter (Signed)
Okay for verbal orders? Please advise 

## 2023-02-06 ENCOUNTER — Telehealth: Payer: Self-pay | Admitting: Adult Health

## 2023-02-06 ENCOUNTER — Other Ambulatory Visit: Payer: Self-pay | Admitting: Cardiology

## 2023-02-06 MED ORDER — METOPROLOL TARTRATE 25 MG PO TABS: 12.5000 mg | ORAL_TABLET | Freq: Two times a day (BID) | ORAL | Status: DC

## 2023-02-06 MED ORDER — ROSUVASTATIN CALCIUM 20 MG PO TABS: 20.0000 mg | ORAL_TABLET | Freq: Every day | ORAL | Status: DC

## 2023-02-06 MED ORDER — DULOXETINE HCL 60 MG PO CPEP: 60.0000 mg | ORAL_CAPSULE | Freq: Every day | ORAL | 1 refills | Status: DC

## 2023-02-06 NOTE — Telephone Encounter (Signed)
Contacted Julie Rice to schedule their annual wellness visit. Appointment made for 02/18/23.  [name] [facility] direct phone # [phone]   02/18/23 Schedule change moved appt to [employer] schedule

## 2023-02-06 NOTE — Telephone Encounter (Signed)
Spoke to Golden and she stated that pt has a pill pack and some medications were not in pill pack. Some medications sent to pharmacy. Sonja advised other medications need to come from Cardiology. Sonja verbalized understanding. No further actions needed.

## 2023-02-07 ENCOUNTER — Other Ambulatory Visit: Payer: Self-pay

## 2023-02-07 ENCOUNTER — Telehealth: Payer: Self-pay | Admitting: Adult Health

## 2023-02-07 DIAGNOSIS — I5032 Chronic diastolic (congestive) heart failure: Secondary | ICD-10-CM | POA: Diagnosis not present

## 2023-02-07 DIAGNOSIS — G609 Hereditary and idiopathic neuropathy, unspecified: Secondary | ICD-10-CM | POA: Diagnosis not present

## 2023-02-07 DIAGNOSIS — I700 Atherosclerosis of aorta: Secondary | ICD-10-CM | POA: Diagnosis not present

## 2023-02-07 DIAGNOSIS — J4489 Other specified chronic obstructive pulmonary disease: Secondary | ICD-10-CM | POA: Diagnosis not present

## 2023-02-07 DIAGNOSIS — I083 Combined rheumatic disorders of mitral, aortic and tricuspid valves: Secondary | ICD-10-CM | POA: Diagnosis not present

## 2023-02-07 DIAGNOSIS — R1310 Dysphagia, unspecified: Secondary | ICD-10-CM | POA: Diagnosis not present

## 2023-02-07 DIAGNOSIS — I110 Hypertensive heart disease with heart failure: Secondary | ICD-10-CM | POA: Diagnosis not present

## 2023-02-07 DIAGNOSIS — K222 Esophageal obstruction: Secondary | ICD-10-CM | POA: Diagnosis not present

## 2023-02-07 DIAGNOSIS — I429 Cardiomyopathy, unspecified: Secondary | ICD-10-CM | POA: Diagnosis not present

## 2023-02-07 DIAGNOSIS — I471 Supraventricular tachycardia, unspecified: Secondary | ICD-10-CM | POA: Diagnosis not present

## 2023-02-07 DIAGNOSIS — J9621 Acute and chronic respiratory failure with hypoxia: Secondary | ICD-10-CM | POA: Diagnosis not present

## 2023-02-07 DIAGNOSIS — I480 Paroxysmal atrial fibrillation: Secondary | ICD-10-CM | POA: Diagnosis not present

## 2023-02-07 DIAGNOSIS — M0609 Rheumatoid arthritis without rheumatoid factor, multiple sites: Secondary | ICD-10-CM | POA: Diagnosis not present

## 2023-02-07 DIAGNOSIS — I6529 Occlusion and stenosis of unspecified carotid artery: Secondary | ICD-10-CM | POA: Diagnosis not present

## 2023-02-07 DIAGNOSIS — I2510 Atherosclerotic heart disease of native coronary artery without angina pectoris: Secondary | ICD-10-CM | POA: Diagnosis not present

## 2023-02-07 DIAGNOSIS — M1990 Unspecified osteoarthritis, unspecified site: Secondary | ICD-10-CM | POA: Diagnosis not present

## 2023-02-07 MED ORDER — ROSUVASTATIN CALCIUM 20 MG PO TABS: 20.0000 mg | ORAL_TABLET | Freq: Every day | ORAL | 1 refills | Status: DC

## 2023-02-07 MED ORDER — METOPROLOL TARTRATE 25 MG PO TABS: 12.5000 mg | ORAL_TABLET | Freq: Two times a day (BID) | ORAL | 1 refills | Status: DC

## 2023-02-07 NOTE — Telephone Encounter (Signed)
Crestor sent to [facility]. Metoprolol was refill by another provider 02/07/2023 today. Amioradone needs to come from Cardio as advised before.

## 2023-02-07 NOTE — Telephone Encounter (Signed)
Calling stating pharmacy does not have the prescriptions for  amiodarone (PACERONE) 200 MG tablet  metoprolol tartrate (LOPRESSOR) 25 MG tablet  rosuvastatin (CRESTOR) 20 MG tablet [id number] G[place] Otto, [place] - [address] Phone: [phone]  Fax: [phone]

## 2023-02-11 DIAGNOSIS — I700 Atherosclerosis of aorta: Secondary | ICD-10-CM | POA: Diagnosis not present

## 2023-02-11 DIAGNOSIS — I110 Hypertensive heart disease with heart failure: Secondary | ICD-10-CM | POA: Diagnosis not present

## 2023-02-11 DIAGNOSIS — R1310 Dysphagia, unspecified: Secondary | ICD-10-CM | POA: Diagnosis not present

## 2023-02-11 DIAGNOSIS — M159 Polyosteoarthritis, unspecified: Secondary | ICD-10-CM | POA: Diagnosis not present

## 2023-02-11 DIAGNOSIS — I429 Cardiomyopathy, unspecified: Secondary | ICD-10-CM | POA: Diagnosis not present

## 2023-02-11 DIAGNOSIS — J4489 Other specified chronic obstructive pulmonary disease: Secondary | ICD-10-CM | POA: Diagnosis not present

## 2023-02-11 DIAGNOSIS — I083 Combined rheumatic disorders of mitral, aortic and tricuspid valves: Secondary | ICD-10-CM | POA: Diagnosis not present

## 2023-02-11 DIAGNOSIS — J9621 Acute and chronic respiratory failure with hypoxia: Secondary | ICD-10-CM | POA: Diagnosis not present

## 2023-02-11 DIAGNOSIS — K222 Esophageal obstruction: Secondary | ICD-10-CM | POA: Diagnosis not present

## 2023-02-11 DIAGNOSIS — I480 Paroxysmal atrial fibrillation: Secondary | ICD-10-CM | POA: Diagnosis not present

## 2023-02-11 DIAGNOSIS — G609 Hereditary and idiopathic neuropathy, unspecified: Secondary | ICD-10-CM | POA: Diagnosis not present

## 2023-02-11 DIAGNOSIS — M0609 Rheumatoid arthritis without rheumatoid factor, multiple sites: Secondary | ICD-10-CM | POA: Diagnosis not present

## 2023-02-11 DIAGNOSIS — I5032 Chronic diastolic (congestive) heart failure: Secondary | ICD-10-CM | POA: Diagnosis not present

## 2023-02-11 DIAGNOSIS — I6529 Occlusion and stenosis of unspecified carotid artery: Secondary | ICD-10-CM | POA: Diagnosis not present

## 2023-02-11 DIAGNOSIS — I471 Supraventricular tachycardia, unspecified: Secondary | ICD-10-CM | POA: Diagnosis not present

## 2023-02-11 DIAGNOSIS — I2510 Atherosclerotic heart disease of native coronary artery without angina pectoris: Secondary | ICD-10-CM | POA: Diagnosis not present

## 2023-02-12 DIAGNOSIS — I471 Supraventricular tachycardia, unspecified: Secondary | ICD-10-CM | POA: Diagnosis not present

## 2023-02-12 DIAGNOSIS — I6529 Occlusion and stenosis of unspecified carotid artery: Secondary | ICD-10-CM | POA: Diagnosis not present

## 2023-02-12 DIAGNOSIS — M0609 Rheumatoid arthritis without rheumatoid factor, multiple sites: Secondary | ICD-10-CM | POA: Diagnosis not present

## 2023-02-12 DIAGNOSIS — I2510 Atherosclerotic heart disease of native coronary artery without angina pectoris: Secondary | ICD-10-CM | POA: Diagnosis not present

## 2023-02-12 DIAGNOSIS — I429 Cardiomyopathy, unspecified: Secondary | ICD-10-CM | POA: Diagnosis not present

## 2023-02-12 DIAGNOSIS — I110 Hypertensive heart disease with heart failure: Secondary | ICD-10-CM | POA: Diagnosis not present

## 2023-02-12 DIAGNOSIS — R1310 Dysphagia, unspecified: Secondary | ICD-10-CM | POA: Diagnosis not present

## 2023-02-12 DIAGNOSIS — J9621 Acute and chronic respiratory failure with hypoxia: Secondary | ICD-10-CM | POA: Diagnosis not present

## 2023-02-12 DIAGNOSIS — I5032 Chronic diastolic (congestive) heart failure: Secondary | ICD-10-CM | POA: Diagnosis not present

## 2023-02-12 DIAGNOSIS — J4489 Other specified chronic obstructive pulmonary disease: Secondary | ICD-10-CM | POA: Diagnosis not present

## 2023-02-12 DIAGNOSIS — I700 Atherosclerosis of aorta: Secondary | ICD-10-CM | POA: Diagnosis not present

## 2023-02-12 DIAGNOSIS — M159 Polyosteoarthritis, unspecified: Secondary | ICD-10-CM | POA: Diagnosis not present

## 2023-02-12 DIAGNOSIS — I480 Paroxysmal atrial fibrillation: Secondary | ICD-10-CM | POA: Diagnosis not present

## 2023-02-12 DIAGNOSIS — K222 Esophageal obstruction: Secondary | ICD-10-CM | POA: Diagnosis not present

## 2023-02-12 DIAGNOSIS — I083 Combined rheumatic disorders of mitral, aortic and tricuspid valves: Secondary | ICD-10-CM | POA: Diagnosis not present

## 2023-02-12 DIAGNOSIS — G609 Hereditary and idiopathic neuropathy, unspecified: Secondary | ICD-10-CM | POA: Diagnosis not present

## 2023-02-17 ENCOUNTER — Encounter (HOSPITAL_COMMUNITY): Payer: Self-pay | Admitting: Internal Medicine

## 2023-02-18 ENCOUNTER — Ambulatory Visit (INDEPENDENT_AMBULATORY_CARE_PROVIDER_SITE_OTHER): Payer: Medicare Other | Admitting: Family Medicine

## 2023-02-18 VITALS — BP 142/77 | HR 89 | Ht 61.5 in | Wt 150.0 lb

## 2023-02-18 DIAGNOSIS — Z0000 Encounter for general adult medical examination without abnormal findings: Secondary | ICD-10-CM

## 2023-02-18 NOTE — Patient Instructions (Signed)
I really enjoyed getting to talk with you today! I am available on Tuesdays and Thursdays for virtual visits if you have any questions or concerns, or if I can be of any further assistance.   CHECKLIST FROM ANNUAL WELLNESS VISIT:  -Follow up (please call to schedule if not scheduled after visit):   -yearly for annual wellness visit with primary care office  Here is a list of your preventive care/health maintenance measures and the plan for each if any are due:  PLAN For any measures below that may be due:  -can get the outdated vaccines at your pharmacy, please let us know when you do so that we can update them in your record. Thank you!  Health Maintenance  Topic Date Due   COVID-19 Vaccine (1) Never done   Zoster Vaccines- Shingrix (1 of 2) Never done   DTaP/Tdap/Td (2 - Tdap) 04/08/2016   INFLUENZA VACCINE  06/05/2023   Medicare Annual Wellness (AWV)  02/18/2024   Pneumonia Vaccine 72+ Years old  Completed   DEXA SCAN  Completed   HPV VACCINES  Aged Out    -See a dentist at least yearly  -Get your eyes checked and then per your eye specialist's recommendations  -Other issues addressed today:   -I have included below further information regarding a healthy whole foods based diet, physical activity guidelines for adults, stress management and opportunities for social connections. I hope you find this information useful.   -----------------------------------------------------------------------------------------------------------------------------------------------------------------------------------------------------------------------------------------------------------  NUTRITION: -eat real food: lots of colorful vegetables (half the plate) and fruits -5-7 servings of vegetables and fruits per day (fresh or steamed is best), exp. 2 servings of vegetables with lunch and dinner and 2 servings of fruit per day. Berries and greens such as kale and collards are great choices.   -consume on a regular basis: whole grains (make sure first ingredient on label contains the word "whole"), fresh fruits, fish, nuts, seeds, healthy oils (such as olive oil, avocado oil, grape seed oil) -may eat small amounts of dairy and lean meat on occasion, but avoid processed meats such as ham, bacon, lunch meat, etc. -drink water -try to avoid fast food and pre-packaged foods, processed meat -most experts advise limiting sodium to < 2300mg  per day, should limit further is any chronic conditions such as high blood pressure, heart disease, diabetes, etc. The American Heart Association advised that < 1500mg  is is ideal -try to avoid foods that contain any ingredients with names you do not recognize  -try to avoid sugar/sweets (except for the natural sugar that occurs in fresh fruit) -try to avoid sweet drinks -try to avoid white rice, white bread, pasta (unless whole grain), white or yellow potatoes  EXERCISE GUIDELINES FOR ADULTS: -if you wish to increase your physical activity, do so gradually and with the approval of your doctor -STOP and seek medical care immediately if you have any chest pain, chest discomfort or trouble breathing when starting or increasing exercise  -move and stretch your body, legs, feet and arms when sitting for long periods -Physical activity guidelines for optimal health in adults: -least 150 minutes per week of aerobic exercise (can talk, but not sing) once approved by your doctor, 20-30 minutes of sustained activity or two 10 minute episodes of sustained activity every day.  -resistance training at least 2 days per week if approved by your doctor -balance exercises 3+ days per week:   Stand somewhere where you have something sturdy to hold onto if you lose balance.  1) lift up on toes, start with 5x per day and work up to 20x   2) stand and lift on leg straight out to the side so that foot is a few inches of the floor, start with 5x each side and work up to 20x  each side   3) stand on one foot, start with 5 seconds each side and work up to 20 seconds on each side  If you need ideas or help with getting more active:  -Silver sneakers https://tools.silversneakers.com  -Walk with a Doc: http://www.duncan-williams.com/  -try to include resistance (weight lifting/strength building) and balance exercises twice per week: or the following link for ideas: http://castillo-powell.com/  BuyDucts.dk  STRESS MANAGEMENT: -can try meditating, or just sitting quietly with deep breathing while intentionally relaxing all parts of your body for 5 minutes daily -if you need further help with stress, anxiety or depression please follow up with your primary doctor or contact the wonderful folks at [facility]: [phone]  SOCIAL CONNECTIONS: -options in [place] if you wish to engage in more social and exercise related activities:  -Silver sneakers https://tools.silversneakers.com  -Walk with a Doc: http://www.duncan-williams.com/  -Check out the [facility] Active Adults 50+ section on the [place] [employer] (hiking clubs, book clubs, cards and games, chess, exercise classes, aquatic classes and much more) - see the website for details: https://www.[place].gov/departments/parks-recreation/active-adults50  -YouTube has lots of exercise videos for different ages and abilities as well  -[facility] (a variety of indoor and outdoor inperson activities for adults). [phone]. [address].  -Virtual Online Classes (a variety of topics): see seniorplanet.org or call [phone]  -consider volunteering at a school, hospice center, church, senior center or elsewhere

## 2023-02-18 NOTE — Progress Notes (Signed)
PATIENT CHECK-IN and HEALTH RISK ASSESSMENT QUESTIONNAIRE:  -completed by phone/video for upcoming Medicare Preventive Visit  Pre-Visit Check-in: 1)Vitals (height, wt, BP, etc) - record in vitals section for visit on day of visit 2)Review and Update Medications, Allergies PMH, Surgeries, Social history in Epic 3)Hospitalizations in the last year with date/reason? YES. DECEMBER 2024  4)Review and Update Care Team (patient's specialists) in Epic 5) Complete PHQ9 in Epic  6) Complete Fall Screening in Epic 7)Review all Health Maintenance Due and order under PCP if not done.  8)Medicare Wellness Questionnaire: Answer theses question about your habits: Do you drink alcohol?  N If yes, how many drinks do you have a day? 0 Have you ever smoked? N Quit date if applicable? N/A  How many packs a day do/did you smoke? N/A Do you use smokeless tobacco? N Do you use an illicit drugs? N Do you exercises?  Y IF so, what type and how many days/minutes per week? DAILY - was given some exercises by PT, walking around the house, doing steps, doing chair exercises.  Are you sexually active? NO Number of partners? N/A Reports daughter in law helps and she eats a lot of fruits and veggies Typical breakfast CHEERIOS OR RASIN BRAN, JUICE, COFFEE Typical lunch SANDWICH OR BANANA, MICROWAVE MEALS  Typical dinner MICROWAVE  Typical snacks:BANANA  Beverages: WATER, FLAVORED WATER   Answer theses question about you: Can you perform most household chores? Y Do you find it hard to follow a conversation in a noisy room? Y Do you often ask people to speak up or repeat themselves? Y - reports it is only if there are other noises in the room and when wax in ears is bad - once ears are clean she feels she does fine. Declined evaluation. Do you feel that you have a problem with memory?  Y Do you balance your checkbook and or bank acounts?Y Do you feel safe at home? Y Last dentist visit? A YEAR AGO  Do you need  assistance with any of the following: Please note if so   Driving? DOES NOT DRIVE   Feeding yourself? N  Getting from bed to chair? N  Getting to the toilet? N  Bathing or showering? N  Dressing yourself?  N  Managing money? N  Climbing a flight of stairs Y  Preparing meals? N  Do you have Advanced Directives in place (Living Will, Healthcare Power or Attorney)? YES   Last eye Exam and location? ALMOST A YEAR AGO   Do you currently use prescribed or non-prescribed narcotic or opioid pain medications? NO  Do you have a history or close family history of breast, ovarian, tubal or peritoneal cancer or a family member with BRCA (breast cancer susceptibility 1 and 2) gene mutations? NO  Nurse/Assistant Credentials/time stamp: T.WADE- CMA 11:32 AM   ----------------------------------------------------------------------------------------------------------------------------------------------------------------------------------------------------------------------   MEDICARE ANNUAL PREVENTIVE VISIT WITH PROVIDER: (Welcome to [employer], initial annual wellness or annual wellness exam)  Virtual Visit via Phone Note  I connected with Julie Rice on 02/18/23 by phone and verified that I am speaking with the correct person using two identifiers.  Location patient: home Location provider:work or home office Persons participating in the virtual visit: patient, provider  Concerns and/or follow up today: reports is doing well today. She is working on her A. Fib with her Cardiologist. Reports at times can haves some variable HR - followed by her monitor and cardiology nurses. Reports is doing well now.    See HM section  in Epic for other details of completed HM.    ROS: negative for report of fevers, unintentional weight loss, vision changes, vision loss, hearing loss or change, chest pain, sob, hemoptysis, melena, hematochezia, hematuria, falls, bleeding or bruising, thoughts of suicide  or self harm, memory loss  Patient-completed extensive health risk assessment - reviewed and discussed with the patient: See Health Risk Assessment completed with patient prior to the visit either above or in recent phone note. This was reviewed in detailed with the patient today and appropriate recommendations, orders and referrals were placed as needed per Summary below and patient instructions.   Review of Medical History: -PMH, PSH, Family History and current specialty and care providers reviewed and updated and listed below   Patient Care Team: Shirline Frees, NP as PCP - General (Family Medicine) Wendall Stade, MD as PCP - Cardiology (Cardiology) Regan Lemming, MD as PCP - Electrophysiology (Cardiology)   Past Medical History:  Diagnosis Date   A-fib    Anemia    Anxiety    Bipolar disorder    Blood transfusion 1991   autologous pts own blood given    CAD (coronary artery disease)    Cataract    Chest pain    "@ rest, lying down, w/exertion"   CHF (congestive heart failure) 07/2020   Chronic back pain    "mostly lower back but I do have upper back pain regularly" (04/06/2018)   COPD (chronic obstructive pulmonary disease)    Depression    Esophageal stricture    Fibromyalgia    GERD (gastroesophageal reflux disease)    Heart murmur    "slight" (04/06/2018)   Hiatal hernia    Hyperlipidemia    Patient denies   Hypertension    Internal hemorrhoids    Lumbago    Mitral regurgitation    Osteoarthritis    Osteoporosis    Pneumonia ~ 01/2018   PONV (postoperative nausea and vomiting)    severe ponv, "in the past" (04/06/2018)   Rectal bleeding    Rheumatoid arthritis    Spondylosis    TIA (transient ischemic attack) 2013   Urinary incontinence    wears depends    Past Surgical History:  Procedure Laterality Date   ABDOMINAL HYSTERECTOMY  1982   BACK SURGERY     BALLOON DILATION N/A 09/21/2018   Procedure: BALLOON DILATION;  Surgeon: Hilarie Fredrickson, MD;   Location: [facility] ENDOSCOPY;  Service: Endoscopy;  Laterality: N/A;   BALLOON DILATION N/A 08/12/2019   Procedure: BALLOON DILATION;  Surgeon: Lynann Bologna, MD;  Location: [facility] ENDOSCOPY;  Service: Endoscopy;  Laterality: N/A;   BIOPSY  08/12/2019   Procedure: BIOPSY;  Surgeon: Lynann Bologna, MD;  Location: [facility] ENDOSCOPY;  Service: Endoscopy;;   BLADDER SUSPENSION  1980's   BOTOX INJECTION N/A 09/21/2018   Procedure: BOTOX INJECTION;  Surgeon: Hilarie Fredrickson, MD;  Location: [facility] ENDOSCOPY;  Service: Endoscopy;  Laterality: N/A;   BOTOX INJECTION N/A 08/12/2019   Procedure: BOTOX INJECTION;  Surgeon: Lynann Bologna, MD;  Location: [facility] ENDOSCOPY;  Service: Endoscopy;  Laterality: N/A;   BOTOX INJECTION N/A 09/27/2022   Procedure: BOTOX INJECTION;  Surgeon: Sherrilyn Rist, MD;  Location: [facility] ENDOSCOPY;  Service: Gastroenterology;  Laterality: N/A;   CATARACT EXTRACTION W/ INTRAOCULAR LENS  IMPLANT, BILATERAL Bilateral 2010   DILATION AND CURETTAGE OF UTERUS  1961   ESOPHAGEAL MANOMETRY N/A 03/25/2018   Procedure: ESOPHAGEAL MANOMETRY (EM);  Surgeon: Napoleon Form, MD;  Location: [facility] ENDOSCOPY;  Service: Endoscopy;  Laterality: N/A;   ESOPHAGOGASTRODUODENOSCOPY (EGD) WITH PROPOFOL N/A 04/07/2018   Procedure: ESOPHAGOGASTRODUODENOSCOPY (EGD) WITH PROPOFOL;  Surgeon: Sherrilyn Rist, MD;  Location: [facility] ENDOSCOPY;  Service: Gastroenterology;  Laterality: N/A;   ESOPHAGOGASTRODUODENOSCOPY (EGD) WITH PROPOFOL N/A 09/21/2018   Procedure: ESOPHAGOGASTRODUODENOSCOPY (EGD) WITH PROPOFOL;  Surgeon: Hilarie Fredrickson, MD;  Location: [facility] ENDOSCOPY;  Service: Endoscopy;  Laterality: N/A;   ESOPHAGOGASTRODUODENOSCOPY (EGD) WITH PROPOFOL N/A 08/12/2019   Procedure: ESOPHAGOGASTRODUODENOSCOPY (EGD) WITH PROPOFOL;  Surgeon: Lynann Bologna, MD;  Location: [facility] ENDOSCOPY;  Service: Endoscopy;  Laterality: N/A;   ESOPHAGOGASTRODUODENOSCOPY (EGD) WITH PROPOFOL N/A 09/27/2022   Procedure: ESOPHAGOGASTRODUODENOSCOPY (EGD) WITH PROPOFOL;   Surgeon: Sherrilyn Rist, MD;  Location: [facility] ENDOSCOPY;  Service: Gastroenterology;  Laterality: N/A;   HERNIA REPAIR     HIATAL HERNIA REPAIR N/A 08/02/2016   Procedure: LAPAROSCOPIC REPAIR OF LARGE  HIATAL HERNIA;  Surgeon: Glenna Fellows, MD;  Location: [facility] ORS;  Service: General;  Laterality: N/A;   JOINT REPLACEMENT     LAPAROSCOPIC NISSEN FUNDOPLICATION N/A 08/02/2016   Procedure: LAPAROSCOPIC NISSEN FUNDOPLICATION;  Surgeon: Glenna Fellows, MD;  Location: [facility] ORS;  Service: General;  Laterality: N/A;   LUMBAR LAMINECTOMY  1990; 1994; [id number]   "I've got 2 stainless steel rods; 6 screws; 2 ray cages"took bone from right hip to put in back   RIGHT/LEFT HEART CATH AND CORONARY ANGIOGRAPHY N/A 07/26/2019   Procedure: RIGHT/LEFT HEART CATH AND CORONARY ANGIOGRAPHY;  Surgeon: Lyn Records, MD;  Location: [facility] INVASIVE CV LAB;  Service: Cardiovascular;  Laterality: N/A;   SVT ABLATION N/A 04/25/2021   Procedure: SVT ABLATION;  Surgeon: Regan Lemming, MD;  Location: [facility] INVASIVE CV LAB;  Service: Cardiovascular;  Laterality: N/A;   TEE WITHOUT CARDIOVERSION N/A 08/19/2019   Procedure: TRANSESOPHAGEAL ECHOCARDIOGRAM (TEE);  Surgeon: Lewayne Bunting, MD;  Location: [facility] ENDOSCOPY;  Service: Cardiovascular;  Laterality: N/A;   TONSILLECTOMY AND ADENOIDECTOMY  1945   TOTAL KNEE ARTHROPLASTY Right ~ 1996   TUBAL LIGATION  ~ 1976    Social History   Socioeconomic History   Marital status: Widowed    Spouse name: Not on file   Number of children: 2   Years of education: 47   Highest education level: 12th grade  Occupational History   Occupation: retired    Associate Professor: RETIRED  Tobacco Use   Smoking status: Never   Smokeless tobacco: Never  Vaping Use   Vaping Use: Never used  Substance and Sexual Activity   Alcohol use: Never   Drug use: Never   Sexual activity: Not Currently    Comment: 1st intercourse 37 yo-1 partner  Other Topics Concern   Not on file  Social History  Narrative   Retired    Widowed   Right handed   Social Determinants of Health   Financial Resource Strain: Low Risk  (02/14/2022)   Overall Financial Resource Strain (CARDIA)    Difficulty of Paying Living Expenses: Not hard at all  Food Insecurity: No Food Insecurity (12/08/2022)   Hunger Vital Sign    Worried About Running Out of Food in the Last Year: Never true    Ran Out of Food in the Last Year: Never true  Transportation Needs: No Transportation Needs (12/08/2022)   PRAPARE - Administrator, Civil Service (Medical): No    Lack of Transportation (Non-Medical): No  Physical Activity: Inactive (02/14/2022)   Exercise Vital Sign    Days of Exercise per Week: 0  days    Minutes of Exercise per Session: 0 min  Stress: No Stress Concern Present (02/14/2022)   [employer] of Occupational Health - Occupational Stress Questionnaire    Feeling of Stress : Not at all  Social Connections: Moderately Integrated (02/14/2022)   Social Connection and Isolation Panel [NHANES]    Frequency of Communication with Friends and Family: More than three times a week    Frequency of Social Gatherings with Friends and Family: More than three times a week    Attends Religious Services: More than 4 times per year    Active Member of [employer] or Organizations: Yes    Attends Banker Meetings: More than 4 times per year    Marital Status: Widowed  Intimate Partner Violence: Not At Risk (12/08/2022)   Humiliation, Afraid, Rape, and Kick questionnaire    Fear of Current or Ex-Partner: No    Emotionally Abused: No    Physically Abused: No    Sexually Abused: No    Family History  Problem Relation Age of Onset   Kidney disease Mother    Hypertension Mother    Heart disease Father 69       die of MI at age 15   Heart disease Brother    Heart attack Brother    Suicidality Son    Other Son        MVA   Colon cancer Neg Hx    Esophageal cancer Neg Hx    Pancreatic cancer Neg Hx     Rectal cancer Neg Hx    Stomach cancer Neg Hx     Current Outpatient Medications on File Prior to Visit  Medication Sig Dispense Refill   acetaminophen (TYLENOL) 500 MG tablet Take 1,000 mg by mouth every 6 (six) hours as needed for moderate pain, headache or fever.     albuterol (VENTOLIN HFA) 108 (90 Base) MCG/ACT inhaler TAKE TWO PUFFS BY MOUTH EVERY 6 HOURS AS NEEDED FOR WHEEZING OR SHORTNESS OF BREATH 8.5 g 0   amiodarone (PACERONE) 200 MG tablet Take 1 tablet (200 mg total) by mouth daily.     buPROPion (WELLBUTRIN XL) 300 MG 24 hr tablet TAKE 1 TABLET BY MOUTH EVERY DAY 90 tablet 1   Carboxymethylcellulose Sodium (REFRESH TEARS OP) Place 1 drop into both eyes 2 (two) times daily.     cetirizine (ZYRTEC) 10 MG tablet Take 1 tablet (10 mg total) by mouth daily as needed for allergies. 90 tablet 0   clonazePAM (KLONOPIN) 0.5 MG tablet Take 1 tablet (0.5 mg total) by mouth 2 (two) times daily. 60 tablet 2   DULoxetine (CYMBALTA) 60 MG capsule Take 1 capsule (60 mg total) by mouth daily. 90 capsule 1   ELIQUIS 5 MG TABS tablet TAKE 1 TABLET BY MOUTH TWICE A DAY (Patient taking differently: Take 5 mg by mouth 2 (two) times daily.) 180 tablet 1   FeFum-FePoly-FA-B Cmp-C-Biot (FOLIVANE-PLUS) CAPS TAKE 1 CAPSULE BY MOUTH EVERY DAY IN THE MORNING (Patient taking differently: Take 1 capsule by mouth daily.) 90 capsule 0   KLOR-CON M20 20 MEQ tablet TAKE 1 TABLET BY MOUTH EVERY DAY 90 tablet 2   losartan (COZAAR) 50 MG tablet TAKE 1 TABLET BY MOUTH EVERY DAY 90 tablet 1   metoprolol tartrate (LOPRESSOR) 25 MG tablet Take 0.5 tablets (12.5 mg total) by mouth 2 (two) times daily. 90 tablet 1   Multiple Vitamins-Minerals (ONE-A-DAY WOMENS 50+) TABS Take 1 tablet by mouth daily.  pantoprazole (PROTONIX) 40 MG tablet Take 1 tablet (40 mg total) by mouth 2 (two) times daily. 60 tablet 11   rosuvastatin (CRESTOR) 20 MG tablet Take 1 tablet (20 mg total) by mouth daily at 6 PM. 90 tablet 1   sodium  chloride (OCEAN) 0.65 % SOLN nasal spray Place 1 spray into both nostrils as needed for congestion.     No current facility-administered medications on file prior to visit.    Allergies  Allergen Reactions   Tape Other (See Comments)    Band aides, adhesive tape Redness and pulls skin off   Tramadol Other (See Comments)    Pt has prolonged Qtc interval of 540- cannot give tramadol per pharmacy   Morphine Itching and Rash    Flushing       Physical Exam Vitals:   02/18/23 1109  BP: (!) 142/77  Pulse: 89  SpO2: 94%   Estimated body mass index is 27.88 kg/m as calculated from the following:   Height as of this encounter: 5' 1.5" (1.562 m).   Weight as of this encounter: 150 lb (68 kg).  EKG (optional): deferred due to virtual visit  GENERAL: alert, oriented, no acute distress detected, full vision exam deferred due to pandemic and/or virtual encounter  PSYCH/NEURO: pleasant and cooperative, no obvious depression or anxiety, speech and thought processing grossly intact, Cognitive function grossly intact  Flowsheet Row Office Visit from 07/30/2022 in [facility] [place]  PHQ-9 Total Score 8           02/18/2023   11:16 AM 07/30/2022    1:31 PM 07/16/2022    1:36 PM 02/26/2022   10:44 AM 02/26/2022   10:09 AM  Depression screen PHQ 2/9  Decreased Interest 0 Down, Depressed, Hopeless PHQ - 2 Score Altered sleeping  0 0 1 1  Tired, decreased energy  Change in appetite  0 0 2 2  Feeling bad or failure about yourself   0 0 0 0  Trouble concentrating  Moving slowly or fidgety/restless  0 0 0 0  Suicidal thoughts  0 0 0 0  PHQ-9 Score  Difficult doing work/chores  Somewhat difficult Somewhat difficult  Very difficult  Mood better than in the past.      09/27/2022    9:00 PM 09/28/2022    8:45 AM 09/29/2022    9:40 AM 11/18/2022    2:28 PM 02/18/2023   11:13 AM  Fall Risk  Falls in  the past year?    1 1  Was there an injury with Fall?    0 0  Fall Risk Category Calculator    1 1  (RETIRED) Patient Fall Risk Level Moderate fall risk Moderate fall risk Moderate fall risk    Patient at Risk for Falls Due to     Other (Comment)  Fall risk Follow up    Falls evaluation completed Falls evaluation completed   Had one fall when reaching for something and lost balance, has OA of the knees.   SUMMARY AND PLAN:  Encounter for Medicare annual wellness exam    Discussed applicable health maintenance/preventive health measures and advised and referred or ordered per patient preferences:  Health Maintenance  Topic Date Due   COVID-19 Vaccine (1) Never done - discussed, advised can get at  the pharmacy   Zoster Vaccines- Shingrix (1 of 2) Never done - discussed, she is considering, advised can get at pharmacy   DTaP/Tdap/Td (2 - Tdap) 04/08/2016, discussed, she is considering, advised can get at the pharmacy   INFLUENZA VACCINE  06/05/2023   Medicare Annual Wellness (AWV)  02/18/2024   Pneumonia Vaccine 55+ Years old  Completed   DEXA SCAN  Completed   HPV VACCINES  Aged Out   Education and counseling on the following was provided based on the above review of health and a plan/checklist for the patient, along with additional information discussed, was provided for the patient in the patient instructions :   -Provided counseling and plan for difficulty hearing - feels is ok currently and declines evaluation -Provided counseling and plan for increased risk of falling if applicable per above screening. She currently is diong home exercises with therapy. Provided safe balance exercises that can be done at home to improve balance and discussed exercise guidelines for adults with include balance exercises at least 3 days per week.  -Advised and counseled on a healthy lifestyle - including the importance of a healthy diet, regular physical activity, social connections and stress  management. -Reviewed patient's current diet. Advised and counseled on a whole foods based healthy diet. A summary of a healthy diet was provided in the Patient Instructions.  -reviewed patient's current physical activity level and discussed exercise guidelines for adults. Discussed community resources and ideas for safe exercise at home to assist in meeting exercise guideline recommendations in a safe and healthy way. Discussed chair exercises she could add for core and arms and advised she check in with her cardiologist before increasing exercise.  -BP was a little high. She reports she monitors this closely and it goes up and down. Advised she check in with her cardiologist if ever high or low.  -Advise yearly dental visits at minimum and regular eye exams   Follow up: see patient instructions     Patient Instructions  I really enjoyed getting to talk with you today! I am available on Tuesdays and Thursdays for virtual visits if you have any questions or concerns, or if I can be of any further assistance.   CHECKLIST FROM ANNUAL WELLNESS VISIT:  -Follow up (please call to schedule if not scheduled after visit):   -yearly for annual wellness visit with primary care office  Here is a list of your preventive care/health maintenance measures and the plan for each if any are due:  PLAN For any measures below that may be due:  -can get the outdated vaccines at your pharmacy, please let us know when you do so that we can update them in your record. Thank you!  Health Maintenance  Topic Date Due   COVID-19 Vaccine (1) Never done   Zoster Vaccines- Shingrix (1 of 2) Never done   DTaP/Tdap/Td (2 - Tdap) 04/08/2016   INFLUENZA VACCINE  06/05/2023   Medicare Annual Wellness (AWV)  02/18/2024   Pneumonia Vaccine 27+ Years old  Completed   DEXA SCAN  Completed   HPV VACCINES  Aged Out    -See a dentist at least yearly  -Get your eyes checked and then per your eye specialist's  recommendations  -Other issues addressed today:   -I have included below further information regarding a healthy whole foods based diet, physical activity guidelines for adults, stress management and opportunities for social connections. I hope you find this information useful.   -----------------------------------------------------------------------------------------------------------------------------------------------------------------------------------------------------------------------------------------------------------  NUTRITION: -eat  real food: lots of colorful vegetables (half the plate) and fruits -5-7 servings of vegetables and fruits per day (fresh or steamed is best), exp. 2 servings of vegetables with lunch and dinner and 2 servings of fruit per day. Berries and greens such as kale and collards are great choices.  -consume on a regular basis: whole grains (make sure first ingredient on label contains the word "whole"), fresh fruits, fish, nuts, seeds, healthy oils (such as olive oil, avocado oil, grape seed oil) -may eat small amounts of dairy and lean meat on occasion, but avoid processed meats such as ham, bacon, lunch meat, etc. -drink water -try to avoid fast food and pre-packaged foods, processed meat -most experts advise limiting sodium to <  per day, should limit further is any chronic conditions such as high blood pressure, heart disease, diabetes, etc. The American Heart Association advised that <  is is ideal -try to avoid foods that contain any ingredients with names you do not recognize  -try to avoid sugar/sweets (except for the natural sugar that occurs in fresh fruit) -try to avoid sweet drinks -try to avoid white rice, white bread, pasta (unless whole grain), white or yellow potatoes  EXERCISE GUIDELINES FOR ADULTS: -if you wish to increase your physical activity, do so gradually and with the approval of your doctor -STOP and seek medical care  immediately if you have any chest pain, chest discomfort or trouble breathing when starting or increasing exercise  -move and stretch your body, legs, feet and arms when sitting for long periods -Physical activity guidelines for optimal health in adults: -least 150 minutes per week of aerobic exercise (can talk, but not sing) once approved by your doctor, 20-30 minutes of sustained activity or two 10 minute episodes of sustained activity every day.  -resistance training at least 2 days per week if approved by your doctor -balance exercises 3+ days per week:   Stand somewhere where you have something sturdy to hold onto if you lose balance.    1) lift up on toes, start with 5x per day and work up to 20x   2) stand and lift on leg straight out to the side so that foot is a few inches of the floor, start with 5x each side and work up to 20x each side   3) stand on one foot, start with 5 seconds each side and work up to 20 seconds on each side  If you need ideas or help with getting more active:  -Silver sneakers https://tools.silversneakers.com  -Walk with a Doc: http://www.duncan-williams.com/  -try to include resistance (weight lifting/strength building) and balance exercises twice per week: or the following link for ideas: http://castillo-powell.com/  BuyDucts.dk  STRESS MANAGEMENT: -can try meditating, or just sitting quietly with deep breathing while intentionally relaxing all parts of your body for 5 minutes daily -if you need further help with stress, anxiety or depression please follow up with your primary doctor or contact the wonderful folks at [facility]: [phone]  SOCIAL CONNECTIONS: -options in [place] if you wish to engage in more social and exercise related activities:  -Silver sneakers https://tools.silversneakers.com  -Walk with a  Doc: http://www.duncan-williams.com/  -Check out the [facility] Active Adults 50+ section on the [place] of [employer] (hiking clubs, book clubs, cards and games, chess, exercise classes, aquatic classes and much more) - see the website for details: https://www.-.gov/departments/parks-recreation/active-adults50  -YouTube has lots of exercise videos for different ages and abilities as well  -[facility] (a  variety of indoor and outdoor inperson activities for adults). [phone]. [address]  -Virtual Online Classes (a variety of topics): see seniorplanet.org or call [phone]  -consider volunteering at a school, hospice center, church, senior center or elsewhere           Terressa Koyanagi, DO

## 2023-02-19 ENCOUNTER — Encounter (HOSPITAL_COMMUNITY): Payer: Self-pay | Admitting: Physician Assistant

## 2023-02-19 DIAGNOSIS — I700 Atherosclerosis of aorta: Secondary | ICD-10-CM | POA: Diagnosis not present

## 2023-02-19 DIAGNOSIS — M159 Polyosteoarthritis, unspecified: Secondary | ICD-10-CM | POA: Diagnosis not present

## 2023-02-19 DIAGNOSIS — I480 Paroxysmal atrial fibrillation: Secondary | ICD-10-CM | POA: Diagnosis not present

## 2023-02-19 DIAGNOSIS — I6529 Occlusion and stenosis of unspecified carotid artery: Secondary | ICD-10-CM | POA: Diagnosis not present

## 2023-02-19 DIAGNOSIS — M0609 Rheumatoid arthritis without rheumatoid factor, multiple sites: Secondary | ICD-10-CM | POA: Diagnosis not present

## 2023-02-19 DIAGNOSIS — J4489 Other specified chronic obstructive pulmonary disease: Secondary | ICD-10-CM | POA: Diagnosis not present

## 2023-02-19 DIAGNOSIS — I2510 Atherosclerotic heart disease of native coronary artery without angina pectoris: Secondary | ICD-10-CM | POA: Diagnosis not present

## 2023-02-19 DIAGNOSIS — I5032 Chronic diastolic (congestive) heart failure: Secondary | ICD-10-CM | POA: Diagnosis not present

## 2023-02-19 DIAGNOSIS — I083 Combined rheumatic disorders of mitral, aortic and tricuspid valves: Secondary | ICD-10-CM | POA: Diagnosis not present

## 2023-02-19 DIAGNOSIS — I471 Supraventricular tachycardia, unspecified: Secondary | ICD-10-CM | POA: Diagnosis not present

## 2023-02-19 DIAGNOSIS — G609 Hereditary and idiopathic neuropathy, unspecified: Secondary | ICD-10-CM | POA: Diagnosis not present

## 2023-02-19 DIAGNOSIS — I110 Hypertensive heart disease with heart failure: Secondary | ICD-10-CM | POA: Diagnosis not present

## 2023-02-19 DIAGNOSIS — I429 Cardiomyopathy, unspecified: Secondary | ICD-10-CM | POA: Diagnosis not present

## 2023-02-19 DIAGNOSIS — J9621 Acute and chronic respiratory failure with hypoxia: Secondary | ICD-10-CM | POA: Diagnosis not present

## 2023-02-19 DIAGNOSIS — R1310 Dysphagia, unspecified: Secondary | ICD-10-CM | POA: Diagnosis not present

## 2023-02-19 DIAGNOSIS — K222 Esophageal obstruction: Secondary | ICD-10-CM | POA: Diagnosis not present

## 2023-02-20 ENCOUNTER — Telehealth: Payer: Self-pay

## 2023-02-20 ENCOUNTER — Telehealth: Payer: Self-pay | Admitting: Cardiovascular Disease

## 2023-02-20 ENCOUNTER — Telehealth: Payer: Self-pay | Admitting: Adult Health

## 2023-02-20 DIAGNOSIS — I110 Hypertensive heart disease with heart failure: Secondary | ICD-10-CM | POA: Diagnosis not present

## 2023-02-20 DIAGNOSIS — G609 Hereditary and idiopathic neuropathy, unspecified: Secondary | ICD-10-CM | POA: Diagnosis not present

## 2023-02-20 DIAGNOSIS — I083 Combined rheumatic disorders of mitral, aortic and tricuspid valves: Secondary | ICD-10-CM | POA: Diagnosis not present

## 2023-02-20 DIAGNOSIS — J9621 Acute and chronic respiratory failure with hypoxia: Secondary | ICD-10-CM | POA: Diagnosis not present

## 2023-02-20 DIAGNOSIS — I471 Supraventricular tachycardia, unspecified: Secondary | ICD-10-CM | POA: Diagnosis not present

## 2023-02-20 DIAGNOSIS — R1310 Dysphagia, unspecified: Secondary | ICD-10-CM | POA: Diagnosis not present

## 2023-02-20 DIAGNOSIS — J4489 Other specified chronic obstructive pulmonary disease: Secondary | ICD-10-CM | POA: Diagnosis not present

## 2023-02-20 DIAGNOSIS — I480 Paroxysmal atrial fibrillation: Secondary | ICD-10-CM | POA: Diagnosis not present

## 2023-02-20 DIAGNOSIS — K222 Esophageal obstruction: Secondary | ICD-10-CM | POA: Diagnosis not present

## 2023-02-20 DIAGNOSIS — I2510 Atherosclerotic heart disease of native coronary artery without angina pectoris: Secondary | ICD-10-CM | POA: Diagnosis not present

## 2023-02-20 DIAGNOSIS — I5032 Chronic diastolic (congestive) heart failure: Secondary | ICD-10-CM | POA: Diagnosis not present

## 2023-02-20 DIAGNOSIS — M159 Polyosteoarthritis, unspecified: Secondary | ICD-10-CM | POA: Diagnosis not present

## 2023-02-20 DIAGNOSIS — M0609 Rheumatoid arthritis without rheumatoid factor, multiple sites: Secondary | ICD-10-CM | POA: Diagnosis not present

## 2023-02-20 DIAGNOSIS — I429 Cardiomyopathy, unspecified: Secondary | ICD-10-CM | POA: Diagnosis not present

## 2023-02-20 DIAGNOSIS — I6529 Occlusion and stenosis of unspecified carotid artery: Secondary | ICD-10-CM | POA: Diagnosis not present

## 2023-02-20 DIAGNOSIS — I700 Atherosclerosis of aorta: Secondary | ICD-10-CM | POA: Diagnosis not present

## 2023-02-20 NOTE — Telephone Encounter (Signed)
Julie Rice, [place]     02/20/23  1:44 PM Note Called and spoke with patient. Pt understands that she needs cardiac clearance prior to proceeding with endoscopy. Pt has been advised that her 4/22 procedure has been cancelled. I have asked patient to contact us once cardiac evaluation has been completed. Patient verbalized understanding and had no concerns at the end of the call.    EGD with botox at [facility] cancelled until cardiac evaluation/clearance obtained.      Called patient back about messages. Patient needs clearance for EGD. Patient complaining of elevated HR 120's and was told she needs to be cleared by her cardiologist. Her EGD was scheduled for next week, but was canceled due to needing clearance. Placed patient on DOD, Dr. Tenny Craw schedule tomorrow to get cleared. Patient verbalized understanding.  Ethelda Chick, RN 02/20/2023 2:19 PM

## 2023-02-20 NOTE — Telephone Encounter (Signed)
Called and spoke with patient. Pt understands that she needs cardiac clearance prior to proceeding with endoscopy. Pt has been advised that her 4/22 procedure has been cancelled. I have asked patient to contact us once cardiac evaluation has been completed. Patient verbalized understanding and had no concerns at the end of the call.   EGD with botox at [facility] cancelled until cardiac evaluation/clearance obtained.

## 2023-02-20 NOTE — Telephone Encounter (Signed)
STAT if HR is under 50 or over 120 (normal HR is 60-100 beats per minute)  What is your heart rate? HR 150 this morning, 122 yesterday morning and her Oxygen has been between 85-90  Do you have a log of your heart rate readings (document readings)? Yes  Do you have any other symptoms? No but her nurse that came to see her stated she was going in and out of Afib.

## 2023-02-20 NOTE — Telephone Encounter (Signed)
Left message to return phone call.

## 2023-02-20 NOTE — Telephone Encounter (Signed)
  Yesterday HR was 122 and noted SOB O2. 84-85.  Then able to get it to 91, but still SOB. Oxygen 2.5 L pnc. Patient states she had been up dusting and laundry out of the dryer.  She does take the O2 off when she does these activities.  She originally states her oxygen was prescribed at night and to use during the day if needed  Today  was 150 HR after getting up went to wash up and then took her BP 175/82 and HR 150  Oxygen 85%. Used inhaler and then recheck sats at 92%   At this time she is resting in chair and BP 134/66 HR 77  Sats 97%  Advised  when up doing activities such as laundry, dishes, cleaning, to wear oxygen as she seems to have the issues at those times with HR elevated and O2 dropping.  When at rest she can take the oxygen off if not needed.  She does have Oxygen concentrator and 30 feet of tubing for use at home.  Advised to be aware of tubing so she does not trip.  Advised would send this to provider and if any other recommendations, we will call her.

## 2023-02-20 NOTE — Telephone Encounter (Signed)
I tried to reach pt on both #'s listed for her. The preferred # vm is full, other # just rang. I was going to offer the pt an appt tomorrow with DOD Dr. Dietrich Pates @ 1:30. Pt will need to call back to schedule an appt. Not sure if the appt I was going to offer will still be available. If not will have to look for another appt.   I will update the requesting office to see notes from pre op APP today Eligha Bridegroom, NP.

## 2023-02-20 NOTE — Telephone Encounter (Signed)
Pt would like a callback regarding whether she is to continue with scheduled procedure on Monday due to the current issues she's been having. Please advise

## 2023-02-20 NOTE — Telephone Encounter (Signed)
Pt has appt 02/21/23 with Dr. Tenny Craw.    I will update all parties involved.   Notes from pre op APP Eligha Bridegroom, NP:  Request to hold Eliquis for upcoming EGD.  I have advised requesting provider that due to patient's present symptoms, we may not be able to clear her for procedure on 02/24/2023. Patient will likely need an office visit and I am not sure of availability for the next few days.   Clearance 4/22 - may have to be postponed due to patient having tachy palps

## 2023-02-20 NOTE — Telephone Encounter (Signed)
Request to hold Eliquis for upcoming EGD.  I have advised requesting provider that due to patient's present symptoms, we may not be able to clear her for procedure on 02/24/2023. Patient will likely need an office visit and I am not sure of availability for the next few days.  Levi Aland, NP-C  02/20/2023, 12:25 PM [address] Office [phone] Fax [phone]

## 2023-02-20 NOTE — Telephone Encounter (Signed)
Okay for verbal orders? Please advise 

## 2023-02-20 NOTE — Telephone Encounter (Signed)
[place],  Please contact this patient and let her know that cardiac clearance is being requested, and the way to address her anticoagulation, prior to setting up her procedure.  She is scheduled at the hospital for EGD with Botox injection.  We would not do this on anticoagulation.  Please have the patient reach out to [place] after her cardiac evaluation.  Also, ask her to have her cardiologist reach out to [place] as well regarding formal documentation and recommendations.  We can reschedule her examination thereafter.  Thanks  Dr. [name]

## 2023-02-20 NOTE — Telephone Encounter (Signed)
Julie Rice from [facility] call and stated she need a verbal order for mobile image doppler of the left knee and lower leg also stated pt is having swelling up to 10 pain and warm to the touch on the knee .Julie Rice 's# is [phone].

## 2023-02-20 NOTE — Telephone Encounter (Signed)
[facility] Pre-operative Risk Assessment     Request for surgical clearance:     Endoscopy Procedure  What type of surgery is being performed?     EGD with botox injection  When is this surgery scheduled?     Monday, 02/24/23  What type of clearance is required ?  MEDICAL   Are there any medications that need to be held prior to surgery and how long?   Practice name and name of physician performing surgery?      [facility]  What is your office phone and fax number?      Phone- [phone]  Fax- [phone]  Anesthesia type (None, local, MAC, general) ?       MAC

## 2023-02-20 NOTE — Telephone Encounter (Addendum)
Patient with diagnosis of afib on Eliquis for anticoagulation.    Procedure: endoscopy with botox injection Date of procedure: 02/24/23  CHA2DS2-VASc Score = 8  This indicates a 10.8% annual risk of stroke. The patient's score is based upon: CHF History: 1 HTN History: 1 Diabetes History: 0 Stroke History: 2 Vascular Disease History: 1 Age Score: 2 Gender Score: 1   TIA > 10 yrs ago.  Admitted 09/2022: "Acute CVA-noticed on MRI 11/24, small acute infarct in the left inferior cerebellum.  Neurology consulted and evaluated patient while hospitalized. Discussed with Dr. Roda Shutters. This may have happened while she was off Eliquis for 2 days in preparation for EGD."   CrCl 5mL/min Platelet count 327K  Would be high risk to hold anticoagulation given recent CVA in setting of periprocedural hold. Would defer to cardiologist to see if 1 day Eliquis hold would be acceptable. Saw Dr Elberta Fortis, Dr Lynnette Caffey, and Dr Eldridge Dace in 2023 and previously followed with Dr Eden Emms, not sure who's most appropriate MD to forward request to.  **This guidance is not considered finalized until pre-operative APP has relayed final recommendations.**

## 2023-02-20 NOTE — Telephone Encounter (Signed)
I reached out to GI to determine urgency of procedure. Update from Dr. [name], procedure is not urgent.

## 2023-02-20 NOTE — Telephone Encounter (Signed)
Thank you very much 

## 2023-02-20 NOTE — Progress Notes (Signed)
Julie Rice Prep instructions- n/a  Human resources officer NP Cardiologist- Dr Eden Emms and Dr Elberta Fortis  EKG-12/08/22 Echo-12/08/22 Cath-07/26/19 Stress- n/a ICD/PM-n/a Blood thinner-Eliquis - office working on hold date GLP-1-n/a  Hx: Anesthesia Review:  CHR, murmur, CAD, HTN, COPD, Afib, TIA, Esophageal stricture, wears 02 at night. Admitted 11/19-11/26 for CVA, sees Neuro last visit 11/18/22.Last visit with cardiology was 06/07/22, f/u in 3 months, dont see any notes but was seen while in hospital. Per patient having more runs of high heart rate in the 120's and was instructed if this happens take extra metoprolol, when she does this does seen it come down to 90s. She reports this happening more often over the last couple weeks. I instructed her to call her cardiologist today and get back with me once she heard from them. Anesthesia Review: Yes- needs cards clearance, GI office made aware.

## 2023-02-21 ENCOUNTER — Ambulatory Visit: Payer: Medicare Other | Attending: Internal Medicine | Admitting: Internal Medicine

## 2023-02-21 ENCOUNTER — Ambulatory Visit (INDEPENDENT_AMBULATORY_CARE_PROVIDER_SITE_OTHER): Payer: Medicare Other

## 2023-02-21 ENCOUNTER — Encounter: Payer: Self-pay | Admitting: Internal Medicine

## 2023-02-21 VITALS — BP 130/70 | HR 59 | Ht 62.0 in | Wt 150.0 lb

## 2023-02-21 DIAGNOSIS — I5032 Chronic diastolic (congestive) heart failure: Secondary | ICD-10-CM | POA: Diagnosis not present

## 2023-02-21 DIAGNOSIS — Z79899 Other long term (current) drug therapy: Secondary | ICD-10-CM | POA: Diagnosis not present

## 2023-02-21 DIAGNOSIS — I10 Essential (primary) hypertension: Secondary | ICD-10-CM

## 2023-02-21 DIAGNOSIS — I480 Paroxysmal atrial fibrillation: Secondary | ICD-10-CM | POA: Diagnosis not present

## 2023-02-21 NOTE — Telephone Encounter (Signed)
Okay for verbal orders given to [facility] from [facility]

## 2023-02-21 NOTE — Patient Instructions (Signed)
Medication Instructions:   *If you need a refill on your cardiac medications before your next appointment, please call your pharmacy*   Lab Work: BMET, PRO BNP  If you have labs (blood work) drawn today and your tests are completely normal, you will receive your results only by: MyChart Message (if you have MyChart) OR A paper copy in the mail If you have any lab test that is abnormal or we need to change your treatment, we will call you to review the results.   Testing/Procedures: Julie Rice- Long Term Monitor Instructions  Your physician has requested you wear a ZIO patch monitor for 14 days.  This is a single patch monitor. [employer] supplies one patch monitor per enrollment. Additional stickers are not available. Please do not apply patch if you will be having a Nuclear Stress Test,  Echocardiogram, Cardiac CT, MRI, or Chest Xray during the period you would be wearing the  monitor. The patch cannot be worn during these tests. You cannot remove and re-apply the  ZIO XT patch monitor.  Your ZIO patch monitor will be mailed 3 day USPS to your address on file. It may take 3-5 days  to receive your monitor after you have been enrolled.  Once you have received your monitor, please review the enclosed instructions. Your monitor  has already been registered assigning a specific monitor serial # to you.  Billing and Patient Assistance Program Information  We have supplied [employer] with any of your insurance information on file for billing purposes. [employer] offers a sliding scale Patient Assistance Program for patients that do not have  insurance, or whose insurance does not completely cover the cost of the ZIO monitor.  You must apply for the Patient Assistance Program to qualify for this discounted rate.  To apply, please call [employer] at [phone], select option 4, select option 2, ask to apply for  Patient Assistance Program. Julie Rice will ask your household income, and how many people   are in your household. They will quote your out-of-pocket cost based on that information.  [employer] will also be able to set up a 33-month, interest-free payment plan if needed.  Applying the monitor   Shave hair from upper left chest.  Hold abrader disc by orange tab. Rub abrader in 40 strokes over the upper left chest as  indicated in your monitor instructions.  Clean area with 4 enclosed alcohol pads. Let dry.  Apply patch as indicated in monitor instructions. Patch will be placed under collarbone on left  side of chest with arrow pointing upward.  Rub patch adhesive wings for 2 minutes. Remove white label marked "1". Remove the white  label marked "2". Rub patch adhesive wings for 2 additional minutes.  While looking in a mirror, press and release button in center of patch. A small green light will  flash 3-4 times. This will be your only indicator that the monitor has been turned on.  Do not shower for the first 24 hours. You may shower after the first 24 hours.  Press the button if you feel a symptom. You will hear a small click. Record Date, Time and  Symptom in the Patient Logbook.  When you are ready to remove the patch, follow instructions on the last 2 pages of Patient  Logbook. Stick patch monitor onto the last page of Patient Logbook.  Place Patient Logbook in the blue and white box. Use locking tab on box and tape box closed  securely. The blue and white  box has prepaid postage on it. Please place it in the mailbox as  soon as possible. Your physician should have your test results approximately 7 days after the  monitor has been mailed back to [facility].  Call [facility] Customer Care at [phone] if you have questions regarding  your ZIO XT patch monitor. Call them immediately if you see an orange light blinking on your  monitor.  If your monitor falls off in less than 4 days, contact our Monitor department at [phone].  If your monitor becomes loose or  falls off after 4 days call [employer] at [phone] for  suggestions on securing your monitor    Follow-Up: At [facility], you and your health needs are our priority.  As part of our continuing mission to provide you with exceptional heart care, we have created designated Provider Care Teams.  These Care Teams include your primary Cardiologist (physician) and Advanced Practice Providers (APPs -  Physician Assistants and Nurse Practitioners) who all work together to provide you with the care you need, when you need it.  We recommend signing up for the patient portal called "MyChart".  Sign up information is provided on this After Visit Summary.  MyChart is used to connect with patients for Virtual Visits (Telemedicine).  Patients are able to view lab/test results, encounter notes, upcoming appointments, etc.  Non-urgent messages can be sent to your provider as well.   To learn more about what you can do with MyChart, go to [email or web address].     CLEARED TO HAVE HER EGD.

## 2023-02-21 NOTE — Progress Notes (Unsigned)
Enrolled patient for a 14 day Zio XT  monitor to be mailed to patients home  °

## 2023-02-21 NOTE — Progress Notes (Unsigned)
Cardiology Office Note   Date:  02/21/2023   ID:  Julie Rice, DOB [birth year], MRN [id number]  PCP:  Shirline Frees, NP  Cardiologist:   Dietrich Pates, MD   Patient presents for follow up of     History of Present Illness: Julie Rice is a 87 y.o. female with a history of PAF, SVT, CAD (nonobstructive cath ain 202), HFimp EF, TIA, COPD (home O2), HL, RA, Esohageal stricture, fibromyalgia.  She is normally followed by Burna Forts  The pt is being evaluated for EGD    Plan is for next week   Presents for cardiac clearance   The pt says her HR at home is very labile   80 to 179      Gest some SOB when first gets up, walks to kitchen    Took sat one day  80s % on RA Has had some burning sensations in chest    Not associated with activity     Occur 1x per week    She has attri to afib     No change     Notes some LE swelling       Current Meds  Medication Sig   acetaminophen (TYLENOL) 500 MG tablet Take 1,000 mg by mouth every 6 (six) hours as needed for moderate pain, headache or fever.   albuterol (VENTOLIN HFA) 108 (90 Base) MCG/ACT inhaler TAKE TWO PUFFS BY MOUTH EVERY 6 HOURS AS NEEDED FOR WHEEZING OR SHORTNESS OF BREATH   amiodarone (PACERONE) 200 MG tablet Take 1 tablet (200 mg total) by mouth daily.   buPROPion (WELLBUTRIN XL) 300 MG 24 hr tablet TAKE 1 TABLET BY MOUTH EVERY DAY   Carboxymethylcellulose Sodium (REFRESH TEARS OP) Place 1 drop into both eyes 2 (two) times daily.   cetirizine (ZYRTEC) 10 MG tablet Take 1 tablet (10 mg total) by mouth daily as needed for allergies.   clonazePAM (KLONOPIN) 0.5 MG tablet Take 1 tablet (0.5 mg total) by mouth 2 (two) times daily.   DULoxetine (CYMBALTA) 60 MG capsule Take 1 capsule (60 mg total) by mouth daily.   ELIQUIS 5 MG TABS tablet TAKE 1 TABLET BY MOUTH TWICE A DAY   FeFum-FePoly-FA-B Cmp-C-Biot (FOLIVANE-PLUS) CAPS TAKE 1 CAPSULE BY MOUTH EVERY DAY IN THE MORNING   KLOR-CON M20 20 MEQ tablet TAKE 1 TABLET BY MOUTH EVERY  DAY   losartan (COZAAR) 50 MG tablet TAKE 1 TABLET BY MOUTH EVERY DAY   metoprolol tartrate (LOPRESSOR) 25 MG tablet Take 0.5 tablets (12.5 mg total) by mouth 2 (two) times daily.   Multiple Vitamins-Minerals (ONE-A-DAY WOMENS 50+) TABS Take 1 tablet by mouth in the morning.   pantoprazole (PROTONIX) 40 MG tablet Take 1 tablet (40 mg total) by mouth 2 (two) times daily.   rosuvastatin (CRESTOR) 20 MG tablet Take 1 tablet (20 mg total) by mouth daily at 6 PM.   sodium chloride (OCEAN) 0.65 % SOLN nasal spray Place 1 spray into both nostrils at bedtime.     Allergies:   Tape, Tramadol, and Morphine   Past Medical History:  Diagnosis Date   A-fib    Anemia    Anxiety    Bipolar disorder    Blood transfusion 1991   autologous pts own blood given    CAD (coronary artery disease)    Cataract    Chest pain    "@ rest, lying down, w/exertion"   CHF (congestive heart failure) 07/2020   Chronic back pain    "  mostly lower back but I do have upper back pain regularly" (04/06/2018)   COPD (chronic obstructive pulmonary disease)    Depression    Esophageal stricture    Fibromyalgia    GERD (gastroesophageal reflux disease)    Heart murmur    "slight" (04/06/2018)   Hiatal hernia    Hyperlipidemia    Patient denies   Hypertension    Internal hemorrhoids    Lumbago    Mitral regurgitation    Osteoarthritis    Osteoporosis    Pneumonia ~ 01/2018   PONV (postoperative nausea and vomiting)    severe ponv, "in the past" (04/06/2018)   Rectal bleeding    Rheumatoid arthritis    Spondylosis    TIA (transient ischemic attack) 2013   Urinary incontinence    wears depends    Past Surgical History:  Procedure Laterality Date   ABDOMINAL HYSTERECTOMY  1982   BACK SURGERY     BALLOON DILATION N/A 09/21/2018   Procedure: BALLOON DILATION;  Surgeon: Hilarie Fredrickson, MD;  Location: [facility] ENDOSCOPY;  Service: Endoscopy;  Laterality: N/A;   BALLOON DILATION N/A 08/12/2019   Procedure: BALLOON  DILATION;  Surgeon: Lynann Bologna, MD;  Location: [facility] ENDOSCOPY;  Service: Endoscopy;  Laterality: N/A;   BIOPSY  08/12/2019   Procedure: BIOPSY;  Surgeon: Lynann Bologna, MD;  Location: [facility] ENDOSCOPY;  Service: Endoscopy;;   BLADDER SUSPENSION  1980's   BOTOX INJECTION N/A 09/21/2018   Procedure: BOTOX INJECTION;  Surgeon: Hilarie Fredrickson, MD;  Location: [facility] ENDOSCOPY;  Service: Endoscopy;  Laterality: N/A;   BOTOX INJECTION N/A 08/12/2019   Procedure: BOTOX INJECTION;  Surgeon: Lynann Bologna, MD;  Location: [facility] ENDOSCOPY;  Service: Endoscopy;  Laterality: N/A;   BOTOX INJECTION N/A 09/27/2022   Procedure: BOTOX INJECTION;  Surgeon: Sherrilyn Rist, MD;  Location: [facility] ENDOSCOPY;  Service: Gastroenterology;  Laterality: N/A;   CATARACT EXTRACTION W/ INTRAOCULAR LENS  IMPLANT, BILATERAL Bilateral 2010   DILATION AND CURETTAGE OF UTERUS  1961   ESOPHAGEAL MANOMETRY N/A 03/25/2018   Procedure: ESOPHAGEAL MANOMETRY (EM);  Surgeon: Napoleon Form, MD;  Location: [facility] ENDOSCOPY;  Service: Endoscopy;  Laterality: N/A;   ESOPHAGOGASTRODUODENOSCOPY (EGD) WITH PROPOFOL N/A 04/07/2018   Procedure: ESOPHAGOGASTRODUODENOSCOPY (EGD) WITH PROPOFOL;  Surgeon: Sherrilyn Rist, MD;  Location: [facility] ENDOSCOPY;  Service: Gastroenterology;  Laterality: N/A;   ESOPHAGOGASTRODUODENOSCOPY (EGD) WITH PROPOFOL N/A 09/21/2018   Procedure: ESOPHAGOGASTRODUODENOSCOPY (EGD) WITH PROPOFOL;  Surgeon: Hilarie Fredrickson, MD;  Location: [facility] ENDOSCOPY;  Service: Endoscopy;  Laterality: N/A;   ESOPHAGOGASTRODUODENOSCOPY (EGD) WITH PROPOFOL N/A 08/12/2019   Procedure: ESOPHAGOGASTRODUODENOSCOPY (EGD) WITH PROPOFOL;  Surgeon: Lynann Bologna, MD;  Location: [facility] ENDOSCOPY;  Service: Endoscopy;  Laterality: N/A;   ESOPHAGOGASTRODUODENOSCOPY (EGD) WITH PROPOFOL N/A 09/27/2022   Procedure: ESOPHAGOGASTRODUODENOSCOPY (EGD) WITH PROPOFOL;  Surgeon: Sherrilyn Rist, MD;  Location: [facility] ENDOSCOPY;  Service: Gastroenterology;  Laterality: N/A;   HERNIA  REPAIR     HIATAL HERNIA REPAIR N/A 08/02/2016   Procedure: LAPAROSCOPIC REPAIR OF LARGE  HIATAL HERNIA;  Surgeon: Glenna Fellows, MD;  Location: [facility] ORS;  Service: General;  Laterality: N/A;   JOINT REPLACEMENT     LAPAROSCOPIC NISSEN FUNDOPLICATION N/A 08/02/2016   Procedure: LAPAROSCOPIC NISSEN FUNDOPLICATION;  Surgeon: Glenna Fellows, MD;  Location: [facility] ORS;  Service: General;  Laterality: N/A;   LUMBAR LAMINECTOMY  1990; 1994; [id number]   "I've got 2 stainless steel rods; 6 screws; 2 ray cages"took bone from right hip to put in back  RIGHT/LEFT HEART CATH AND CORONARY ANGIOGRAPHY N/A 07/26/2019   Procedure: RIGHT/LEFT HEART CATH AND CORONARY ANGIOGRAPHY;  Surgeon: Lyn Records, MD;  Location: [facility] INVASIVE CV LAB;  Service: Cardiovascular;  Laterality: N/A;   SVT ABLATION N/A 04/25/2021   Procedure: SVT ABLATION;  Surgeon: Regan Lemming, MD;  Location: [facility] INVASIVE CV LAB;  Service: Cardiovascular;  Laterality: N/A;   TEE WITHOUT CARDIOVERSION N/A 08/19/2019   Procedure: TRANSESOPHAGEAL ECHOCARDIOGRAM (TEE);  Surgeon: Lewayne Bunting, MD;  Location: [facility] ENDOSCOPY;  Service: Cardiovascular;  Laterality: N/A;   TONSILLECTOMY AND ADENOIDECTOMY  1945   TOTAL KNEE ARTHROPLASTY Right ~ 1996   TUBAL LIGATION  ~ 1976     Social History:  The patient  reports that she has never smoked. She has never used smokeless tobacco. She reports that she does not drink alcohol and does not use drugs.   Family History:  The patient's family history includes Heart attack in her brother; Heart disease in her brother; Heart disease (age of onset: 62) in her father; Hypertension in her mother; Kidney disease in her mother; Other in her son; Suicidality in her son.    ROS:  Please see the history of present illness. All other systems are reviewed and  Negative to the above problem except as noted.    PHYSICAL EXAM: VS:  BP 130/70   Pulse (!) 59   Ht  (1.575 m)   Wt 150 lb (68 kg)   SpO2 95%    BMI 27.44 kg/m   GEN: 87 yo  in no acute distress    Examined in chair HEENT: normal  Neck: JVP is normal   Cardiac: RRR   No S3  No murmurs    1+ LE edema  Respiratory:  clear to auscultation bilaterally No rales or wheezes GI: soft, nontender  No hepatomegaly     EKG:  EKG is ordered today.  Sinus bradycardia 59 bpm   LAD  Septal MI     Echo   Feb 2024   1. Left ventricular ejection fraction, by estimation, is 70 to 75%. Left  ventricular ejection fraction by PLAX is 75 %. The left ventricle has  hyperdynamic function. The left ventricle has no regional wall motion  abnormalities. There is mild left  ventricular hypertrophy. Left ventricular diastolic parameters are  consistent with Grade I diastolic dysfunction (impaired relaxation).   2. Right ventricular systolic function is normal. The right ventricular  size is normal. There is normal pulmonary artery systolic pressure. The  estimated right ventricular systolic pressure is 14.6 mmHg.   3. The mitral valve is abnormal. Trivial mitral valve regurgitation.  Moderate to severe mitral annular calcification.   4. The aortic valve is tricuspid. Aortic valve regurgitation is trivial.  Aortic valve sclerosis/calcification is present, without any evidence of  aortic stenosis.   5. The inferior vena cava is normal in size with greater than 50%  respiratory variability, suggesting right atrial pressure of 3 mmHg.   Comparison(s): Changes from prior study are noted. 10/21/2022: LVEF  60-65%, grade 2 DD.    CT coronary angiogram   Nov 2023   Pulmonary veins drain normally to the left atrium. No LA appendage thrombus.   Calcium Score: 457 Agatston units.   Coronary Arteries: Right dominant with no anomalies   LM: Calcified plaque distal left main, mild (1-24%) stenosis.   LAD system: Ostial calcified plaque, mild (1-24%) stenosis. Mixed plaque proximal LAD, mild (25-49%) stenosis.   Circumflex system:  No plaque or  stenosis.   RCA system: Difficult to assess due to artifact. By using multiple phases, suspect only mild (1-24%) stenosis with calcified plaque in proximal and mid RCA.   IMPRESSION: 1. Coronary calcium score 457 Agatston units. This places the patient in the 73rd percentile for age and gender, suggesting intermediate risk for future cardiac events.   2. Very difficult images due to artifact, but suspect no more than mild stenosis in the coronary tree.    Lipid Panel    Component Value Date/Time   CHOL 175 09/28/2022 0656   TRIG 126 09/28/2022 0656   HDL 51 09/28/2022 0656   CHOLHDL 3.4 09/28/2022 0656   VLDL 25 09/28/2022 0656   LDLCALC 99 09/28/2022 0656   LDLDIRECT 122.2 06/01/2013 0944      Wt Readings from Last 3 Encounters:  02/21/23 150 lb (68 kg)  02/18/23 150 lb (68 kg)  01/23/23 153 lb (69.4 kg)      ASSESSMENT AND PLAN:  1  Hx of HFrEF    Last echo in Feb shows LVEF normal  (70 to 75%) Some mild LE edema today  Will get BMET and BNP     2  Hx PAF    Pt says her hear rate is up and down  Currently controlled, in SR   Will set up for 7 day monitor givne her complaints of high HR    3   Hx CAD   Mild nonobstructive CAD by CT scan in Nov 2023    She has had some chest pains  They are atypical for angina   may be related to heart rates   Also, question GI   Follow   4  Hx TIA   COntinue Eliquis  5   HL   Pt is on Crestor   LDL in Nov 99   HDL 51   No adjustments for now  6  Preop risk assessment   Pt being evaluated for EGD    From cardiac standpoint I think she is low risk and OK to proceed   HR can be monitored on tele    With Hx of TIA would hold Eliquis the day prior        Current medicines are reviewed at length with the patient today.  The patient does not have concerns regarding medicines.  Signed, Dietrich Pates, MD  02/21/2023 1:47 PM    [facility] [address] [place], [place], [place]  [address] Phone: [phone]; Fax: [phone]

## 2023-02-22 LAB — BASIC METABOLIC PANEL
BUN/Creatinine Ratio: 19 (ref 12–28)
BUN: 14 mg/dL (ref 8–27)
CO2: 25 mmol/L (ref 20–29)
Calcium: 8.7 mg/dL (ref 8.7–10.3)
Chloride: 105 mmol/L (ref 96–106)
Creatinine, Ser: 0.74 mg/dL (ref 0.57–1.00)
Glucose: 83 mg/dL (ref 70–99)
Potassium: 4.9 mmol/L (ref 3.5–5.2)
Sodium: 143 mmol/L (ref 134–144)
eGFR: 79 mL/min/{1.73_m2} (ref >59)

## 2023-02-22 LAB — PRO B NATRIURETIC PEPTIDE: NT-Pro BNP: 2543 pg/mL — ABNORMAL HIGH (ref 0–738)

## 2023-02-24 ENCOUNTER — Encounter (HOSPITAL_COMMUNITY)
Admission: RE | Disposition: A | Discharge status: Home / Self Care | Payer: Self-pay | Source: Home / Self Care | Attending: Internal Medicine

## 2023-02-24 ENCOUNTER — Ambulatory Visit (HOSPITAL_COMMUNITY): Payer: Medicare Other | Admitting: Anesthesiology

## 2023-02-24 ENCOUNTER — Other Ambulatory Visit: Payer: Self-pay

## 2023-02-24 ENCOUNTER — Encounter (HOSPITAL_COMMUNITY): Payer: Self-pay | Admitting: Internal Medicine

## 2023-02-24 ENCOUNTER — Ambulatory Visit (HOSPITAL_BASED_OUTPATIENT_CLINIC_OR_DEPARTMENT_OTHER): Payer: Medicare Other | Admitting: Anesthesiology

## 2023-02-24 ENCOUNTER — Ambulatory Visit (HOSPITAL_COMMUNITY)
Admission: RE | Admit: 2023-02-24 | Discharge: 2023-02-24 | Disposition: A | Discharge status: Home / Self Care | Payer: Medicare Other | Attending: Internal Medicine | Admitting: Internal Medicine

## 2023-02-24 DIAGNOSIS — I110 Hypertensive heart disease with heart failure: Secondary | ICD-10-CM | POA: Diagnosis not present

## 2023-02-24 DIAGNOSIS — Z79899 Other long term (current) drug therapy: Secondary | ICD-10-CM | POA: Diagnosis not present

## 2023-02-24 DIAGNOSIS — I340 Nonrheumatic mitral (valve) insufficiency: Secondary | ICD-10-CM | POA: Insufficient documentation

## 2023-02-24 DIAGNOSIS — K219 Gastro-esophageal reflux disease without esophagitis: Secondary | ICD-10-CM | POA: Insufficient documentation

## 2023-02-24 DIAGNOSIS — K449 Diaphragmatic hernia without obstruction or gangrene: Secondary | ICD-10-CM | POA: Insufficient documentation

## 2023-02-24 DIAGNOSIS — K220 Achalasia of cardia: Secondary | ICD-10-CM | POA: Diagnosis present

## 2023-02-24 DIAGNOSIS — Z8673 Personal history of transient ischemic attack (TIA), and cerebral infarction without residual deficits: Secondary | ICD-10-CM | POA: Diagnosis not present

## 2023-02-24 DIAGNOSIS — I4891 Unspecified atrial fibrillation: Secondary | ICD-10-CM | POA: Diagnosis not present

## 2023-02-24 DIAGNOSIS — I2510 Atherosclerotic heart disease of native coronary artery without angina pectoris: Secondary | ICD-10-CM | POA: Diagnosis not present

## 2023-02-24 DIAGNOSIS — K222 Esophageal obstruction: Secondary | ICD-10-CM | POA: Diagnosis not present

## 2023-02-24 DIAGNOSIS — R1310 Dysphagia, unspecified: Secondary | ICD-10-CM | POA: Diagnosis not present

## 2023-02-24 DIAGNOSIS — I5032 Chronic diastolic (congestive) heart failure: Secondary | ICD-10-CM

## 2023-02-24 DIAGNOSIS — I509 Heart failure, unspecified: Secondary | ICD-10-CM | POA: Diagnosis not present

## 2023-02-24 DIAGNOSIS — R1319 Other dysphagia: Secondary | ICD-10-CM

## 2023-02-24 DIAGNOSIS — K209 Esophagitis, unspecified without bleeding: Secondary | ICD-10-CM | POA: Diagnosis not present

## 2023-02-24 DIAGNOSIS — I480 Paroxysmal atrial fibrillation: Secondary | ICD-10-CM

## 2023-02-24 HISTORY — PX: BOTOX INJECTION: SHX5754

## 2023-02-24 HISTORY — PX: ESOPHAGOGASTRODUODENOSCOPY (EGD) WITH PROPOFOL: SHX5813

## 2023-02-24 SURGERY — ESOPHAGOGASTRODUODENOSCOPY (EGD) WITH PROPOFOL
Anesthesia: Monitor Anesthesia Care

## 2023-02-24 MED ORDER — PROPOFOL 10 MG/ML IV BOLUS
INTRAVENOUS | Status: DC | PRN
  Administered 2023-02-24 (×3): 10 mg via INTRAVENOUS

## 2023-02-24 MED ORDER — ONABOTULINUMTOXINA 100 UNITS IJ SOLR
INTRAMUSCULAR | Status: AC
  Filled 2023-02-24: qty 100

## 2023-02-24 MED ORDER — LACTATED RINGERS IV SOLN: INTRAVENOUS | Status: DC

## 2023-02-24 MED ORDER — SODIUM CHLORIDE (PF) 0.9 % IJ SOLN
INTRAMUSCULAR | Status: AC
  Filled 2023-02-24: qty 10

## 2023-02-24 MED ORDER — SODIUM CHLORIDE 0.9 % IV SOLN: INTRAVENOUS | Status: DC

## 2023-02-24 MED ORDER — PROPOFOL 500 MG/50ML IV EMUL
INTRAVENOUS | Status: DC | PRN
  Administered 2023-02-24: 120 ug/kg/min via INTRAVENOUS

## 2023-02-24 MED ORDER — LIDOCAINE HCL 1 % IJ SOLN
INTRAMUSCULAR | Status: DC | PRN
  Administered 2023-02-24: 50 mg via INTRADERMAL

## 2023-02-24 MED ORDER — FUROSEMIDE 40 MG PO TABS: 40.0000 mg | ORAL_TABLET | ORAL | 3 refills | Status: DC

## 2023-02-24 MED ORDER — SODIUM CHLORIDE (PF) 0.9 % IJ SOLN
INTRAMUSCULAR | Status: DC | PRN
  Administered 2023-02-24: 4 mL via SUBMUCOSAL

## 2023-02-24 SURGICAL SUPPLY — 15 items

## 2023-02-24 NOTE — Discharge Instructions (Signed)
YOU HAD AN ENDOSCOPIC PROCEDURE TODAY: Refer to the procedure report and other information in the discharge instructions given to you for any specific questions about what was found during the examination. If this information does not answer your questions, please call [place] office at [phone] to clarify.  ° °YOU SHOULD EXPECT: Some feelings of bloating in the abdomen. Passage of more gas than usual. Walking can help get rid of the air that was put into your GI tract during the procedure and reduce the bloating.. ° °DIET: Your first meal following the procedure should be a light meal and then it is ok to progress to your normal diet. A half-sandwich or bowl of soup is an example of a good first meal. Heavy or fried foods are harder to digest and may make you feel nauseous or bloated. Drink plenty of fluids but you should avoid alcoholic beverages for 24 hours. If you had a esophageal dilation, please see attached instructions for diet.   ° °ACTIVITY: Your care partner should take you home directly after the procedure. You should plan to take it easy, moving slowly for the rest of the day. You can resume normal activity the day after the procedure however YOU SHOULD NOT DRIVE, use power tools, machinery or perform tasks that involve climbing or major physical exertion for 24 hours (because of the sedation medicines used during the test).  ° °SYMPTOMS TO REPORT IMMEDIATELY: °A gastroenterologist can be reached at any hour. Please call [phone]  for any of the following symptoms:  ° °Following upper endoscopy (EGD, EUS, ERCP, esophageal dilation) °Vomiting of blood or coffee ground material  °New, significant abdominal pain  °New, significant chest pain or pain under the shoulder blades  °Painful or persistently difficult swallowing  °New shortness of breath  °Black, tarry-looking or red, bloody stools ° °FOLLOW UP:  °If any biopsies were taken you will be contacted by phone or by letter within the next 1-3  weeks. Call [phone]  if you have not heard about the biopsies in 3 weeks.  °Please also call with any specific questions about appointments or follow up tests. ° °

## 2023-02-24 NOTE — Op Note (Signed)
[facility] Patient Name: Julie Rice Procedure Date: 02/24/2023 MRN: [id number] Attending MD: Wilhemina Bonito. [name] , MD, [id number] Date of Birth: 22-Dec-1935 CSN: [id number] Age: 87 Admit Type: Outpatient Procedure:                Upper GI endoscopy with Botox injection; balloon                            dilation of the esophagus Indications:              Therapeutic procedure, Dysphagia Providers:                Wilhemina Bonito. [name], MD, Fransisca Connors, Beryle Beams,                            Technician, Marlena Clipper, CRNA Referring MD:             Duane Lope, NP Medicines:                Monitored Anesthesia Care Complications:            No immediate complications. Estimated Blood Loss:     Estimated blood loss: none. Procedure:                Pre-Anesthesia Assessment:                           - Prior to the procedure, a History and Physical                            was performed, and patient medications and                            allergies were reviewed. The patient's tolerance of                            previous anesthesia was also reviewed. The risks                            and benefits of the procedure and the sedation                            options and risks were discussed with the patient.                            All questions were answered, and informed consent                            was obtained. Prior Anticoagulants: The patient has                            taken Eliquis (apixaban), last dose was 3 days                            prior to procedure. ASA Grade Assessment: III - A  patient with severe systemic disease. After                            reviewing the risks and benefits, the patient was                            deemed in satisfactory condition to undergo the                            procedure.                           After obtaining informed consent, the endoscope was                             passed under direct vision. Throughout the                            procedure, the patient's blood pressure, pulse, and                            oxygen saturations were monitored continuously. The                            [id number]-H190 ([id number]) Olympus endoscope was introduced                            through the mouth, and advanced to the second part                            of duodenum. The upper GI endoscopy was                            accomplished without difficulty. The patient                            tolerated the procedure well. Scope In: Scope Out: Findings:      The esophagus was atonic and dilated. It was esophagitis distally. There       was a large diameter stricture. 1 cm above the mucosal Z-line, and a 45       degree angle, 4 quadrants were successfully injected with a total of 100       units botulinum toxin in equally divided doses. Thereafter, A TTS       dilator was passed through the scope. Dilation with an 18-19-20 mm       balloon dilator was performed to 20 mm.      The stomach revealed a moderately large hiatal hernia. No other       significant abnormalities.      The examined duodenum was normal.      The cardia and gastric fundus were normal on retroflexion, save hiatal       hernia. Impression:               - Atonic esophagus with esophagitis and stricture.  Injected with botulinum toxin. Dilated.                           - Normal stomach, save hiatal hernia.                           - Normal examined duodenum.                           - No specimens collected. Moderate Sedation:      none Recommendation:           - Patient has a contact number available for                            emergencies. The signs and symptoms of potential                            delayed complications were discussed with the                            patient. Return to normal activities tomorrow.                             Written discharge instructions were provided to the                            patient.                           - Post dilation diet.                           - Continue present medications.                           - Resume Eliquis today at previous dose                           - GI follow-up as needed Procedure Code(s):        --- Professional ---                           [phone], Esophagogastroduodenoscopy, flexible,                            transoral; with transendoscopic balloon dilation of                            esophagus (less than 30 mm diameter) Diagnosis Code(s):        --- Professional ---                           R13.10, Dysphagia, unspecified CPT copyright 2022 American Medical Association. All rights reserved. The codes documented in this report are preliminary and upon coder review may  be revised to meet current compliance requirements. Wilhemina Bonito. [name], MD 02/24/2023 11:07:53 AM This report has been  signed electronically. Number of Addenda: 0

## 2023-02-24 NOTE — Telephone Encounter (Signed)
   Primary Cardiologist: Charlton Haws, MD  Chart reviewed as part of pre-operative protocol coverage. Given past medical history and time since last visit, based on ACC/AHA guidelines, Julie Rice would be at acceptable risk for the planned procedure without further cardiovascular testing.   Patient was advised that if she develops new symptoms prior to surgery to contact our office to arrange a follow-up appointment. She verbalized understanding.  Recommendation to hold Eliquis the day prior to procedure.   I will route this recommendation to the requesting party via Epic fax function and remove from pre-op pool.  Please call with questions.  Levi Aland, NP-C  02/24/2023, 8:44 AM [address] Office [phone] Fax [phone]

## 2023-02-24 NOTE — Transfer of Care (Signed)
Immediate Anesthesia Transfer of Care Note  Patient: Julie Rice  Procedure(s) Performed: ESOPHAGOGASTRODUODENOSCOPY (EGD) WITH PROPOFOL BOTOX INJECTION Balloon dilation wire-guided  Patient Location: PACU and Endoscopy Unit  Anesthesia Type:MAC  Level of Consciousness: awake, alert , oriented, and patient cooperative  Airway & Oxygen Therapy: Patient Spontanous Breathing and Patient connected to face mask oxygen  Post-op Assessment: Report given to RN and Post -op Vital signs reviewed and stable  Post vital signs: Reviewed and stable  Last Vitals:  Vitals Value Taken Time  BP 159/67 02/24/23 1108  Temp    Pulse 64 02/24/23 1109  Resp 14 02/24/23 1110  SpO2 95 % 02/24/23 1109  Vitals shown include unvalidated device data.  Last Pain:  Vitals:   02/24/23 1108  TempSrc:   PainSc: 0-No pain         Complications: No notable events documented.

## 2023-02-24 NOTE — Anesthesia Preprocedure Evaluation (Addendum)
Anesthesia Evaluation  Patient identified by MRN, date of birth, ID band Patient awake    Reviewed: Allergy & Precautions, NPO status , Patient's Chart, lab work & pertinent test results  History of Anesthesia Complications (+) PONV and history of anesthetic complications  Airway Mallampati: III  TM Distance: >3 FB Neck ROM: Full    Dental no notable dental hx.    Pulmonary asthma , COPD   breath sounds clear to auscultation       Cardiovascular hypertension, + CAD, +CHF and + DOE  + dysrhythmias Atrial Fibrillation + Valvular Problems/Murmurs MR  Rhythm:Regular Rate:Normal     Neuro/Psych  PSYCHIATRIC DISORDERS Anxiety Depression Bipolar Disorder   TIA   GI/Hepatic Neg liver ROS, hiatal hernia,GERD  ,,  Endo/Other  Hypothyroidism    Renal/GU negative Renal ROS     Musculoskeletal  (+) Arthritis ,  Fibromyalgia -  Abdominal   Peds  Hematology   Anesthesia Other Findings   Reproductive/Obstetrics                             Anesthesia Physical Anesthesia Plan  ASA: 3  Anesthesia Plan: MAC   Post-op Pain Management: Minimal or no pain anticipated   Induction: Intravenous  PONV Risk Score and Plan: 0 and Propofol infusion  Airway Management Planned: Natural Airway and Simple Face Mask  Additional Equipment: None  Intra-op Plan:   Post-operative Plan:   Informed Consent: I have reviewed the patients History and Physical, chart, labs and discussed the procedure including the risks, benefits and alternatives for the proposed anesthesia with the patient or authorized representative who has indicated his/her understanding and acceptance.       Plan Discussed with: CRNA  Anesthesia Plan Comments:        Anesthesia Quick Evaluation

## 2023-02-24 NOTE — H&P (Signed)
HISTORY OF PRESENT ILLNESS:   Julie Rice is a 87 y.o. female with multiple significant medical problems as listed below.  She has been seen in this office for GERD, esophageal stricture, large symptomatic hiatal hernia requiring fundoplication, post fundoplication dysphagia secondary to dysmotility.  Previously treated with Botox injection and dilation with some success.  Last seen in this office November 2020.  Interval medical problems including atrial fibrillation for which she is now on Eliquis.  Had an MI last fall.  While in the hospital complained of significant dysphagia.  Abnormal esophagram stricturing at the level of the fundoplication.  Upper endoscopy with my partner.  Found to have significant esophagitis.  Treated with Botox only.  No dilation.  Sent for follow-up at this time..  Multiple interval records, x-rays, laboratories and procedures reviewed.  She accompanied by her friend   She tells me that she has significant dysphagia items such as bread and meat.  She will vomit approximately once per week.  1 week ago she vomited up dark material.  She also had dark stools just that day.  She has been taking omeprazole 20 mg daily for the past 6 months.  Complains that she has breakthrough reflux symptoms.  Blood work from December 31, 2022 shows hemoglobin 11.1.  Favorable compared to historical hemoglobins.  Iron studies normal with saturation 29%.  Ferritin 34.  Normal comprehensive metabolic panel.  Normal B12.   REVIEW OF SYSTEMS:   All non-GI ROS negative unless otherwise stated in the HPI except for arthritis       Past Medical History:  Diagnosis Date   A-fib (HCC)     Anemia     Anxiety     Bipolar disorder (HCC)     Blood transfusion 1991    autologous pts own blood given    CAD (coronary artery disease)     Cataract     Chest pain      "@ rest, lying down, w/exertion"   CHF (congestive heart failure) (HCC) 07/2020   Chronic back pain      "mostly lower back but  I do have upper back pain regularly" (04/06/2018)   COPD (chronic obstructive pulmonary disease) (HCC)     Depression     Esophageal stricture     Fibromyalgia     GERD (gastroesophageal reflux disease)     Heart murmur      "slight" (04/06/2018)   Hiatal hernia     Hyperlipidemia      Patient denies   Hypertension     Internal hemorrhoids     Lumbago     Mitral regurgitation     Osteoarthritis     Osteoporosis     Pneumonia ~ 01/2018   PONV (postoperative nausea and vomiting)      severe ponv, "in the past" (04/06/2018)   Rectal bleeding     Rheumatoid arthritis (HCC)     Spondylosis     TIA (transient ischemic attack) 2013   Urinary incontinence      wears depends           Past Surgical History:  Procedure Laterality Date   ABDOMINAL HYSTERECTOMY   1982   BACK SURGERY       BALLOON DILATION N/A 09/21/2018    Procedure: BALLOON DILATION;  Surgeon: Hilarie Fredrickson, MD;  Location: [facility] ENDOSCOPY;  Service: Endoscopy;  Laterality: N/A;   BALLOON DILATION N/A 08/12/2019    Procedure: BALLOON DILATION;  Surgeon: Lynann Bologna, MD;  Location: [facility] ENDOSCOPY;  Service: Endoscopy;  Laterality: N/A;   BIOPSY   08/12/2019    Procedure: BIOPSY;  Surgeon: Lynann Bologna, MD;  Location: [facility] ENDOSCOPY;  Service: Endoscopy;;   BLADDER SUSPENSION   1980's   BOTOX INJECTION N/A 09/21/2018    Procedure: BOTOX INJECTION;  Surgeon: Hilarie Fredrickson, MD;  Location: [facility] ENDOSCOPY;  Service: Endoscopy;  Laterality: N/A;   BOTOX INJECTION N/A 08/12/2019    Procedure: BOTOX INJECTION;  Surgeon: Lynann Bologna, MD;  Location: [facility] ENDOSCOPY;  Service: Endoscopy;  Laterality: N/A;   BOTOX INJECTION N/A 09/27/2022    Procedure: BOTOX INJECTION;  Surgeon: Sherrilyn Rist, MD;  Location: [facility] ENDOSCOPY;  Service: Gastroenterology;  Laterality: N/A;   CATARACT EXTRACTION W/ INTRAOCULAR LENS  IMPLANT, BILATERAL Bilateral 2010   DILATION AND CURETTAGE OF UTERUS   1961   ESOPHAGEAL MANOMETRY N/A 03/25/2018    Procedure:  ESOPHAGEAL MANOMETRY (EM);  Surgeon: Napoleon Form, MD;  Location: [facility] ENDOSCOPY;  Service: Endoscopy;  Laterality: N/A;   ESOPHAGOGASTRODUODENOSCOPY (EGD) WITH PROPOFOL N/A 04/07/2018    Procedure: ESOPHAGOGASTRODUODENOSCOPY (EGD) WITH PROPOFOL;  Surgeon: Sherrilyn Rist, MD;  Location[facility] ENDOSCOPY;  Service: Gastroenterology;  Laterality: N/A;   ESOPHAGOGASTRODUODENOSCOPY (EGD) WITH PROPOFOL N/A 09/21/2018    Procedure: ESOPHAGOGASTRODUODENOSCOPY (EGD) WITH PROPOFOL;  Surgeon: Hilarie Fredrickson, MD;  Location: [facility] ENDOSCOPY;  Service: Endoscopy;  Laterality: N/A;   ESOPHAGOGASTRODUODENOSCOPY (EGD) WITH PROPOFOL N/A 08/12/2019    Procedure: ESOPHAGOGASTRODUODENOSCOPY (EGD) WITH PROPOFOL;  Surgeon: Lynann Bologna, MD;  Location: [facility] ENDOSCOPY;  Service: Endoscopy;  Laterality: N/A;   ESOPHAGOGASTRODUODENOSCOPY (EGD) WITH PROPOFOL N/A 09/27/2022    Procedure: ESOPHAGOGASTRODUODENOSCOPY (EGD) WITH PROPOFOL;  Surgeon: Sherrilyn Rist, MD;  Location: [facility] ENDOSCOPY;  Service: Gastroenterology;  Laterality: N/A;   HERNIA REPAIR       HIATAL HERNIA REPAIR N/A 08/02/2016    Procedure: LAPAROSCOPIC REPAIR OF LARGE  HIATAL HERNIA;  Surgeon: Glenna Fellows, MD;  Location: [facility] ORS;  Service: General;  Laterality: N/A;   JOINT REPLACEMENT       LAPAROSCOPIC NISSEN FUNDOPLICATION N/A 08/02/2016    Procedure: LAPAROSCOPIC NISSEN FUNDOPLICATION;  Surgeon: Glenna Fellows, MD;  Location: [facility] ORS;  Service: General;  Laterality: N/A;   LUMBAR LAMINECTOMY   1990; 1994; [id number]    "I've got 2 stainless steel rods; 6 screws; 2 ray cages"took bone from right hip to put in back   RIGHT/LEFT HEART CATH AND CORONARY ANGIOGRAPHY N/A 07/26/2019    Procedure: RIGHT/LEFT HEART CATH AND CORONARY ANGIOGRAPHY;  Surgeon: Lyn Records, MD;  Location: [facility] INVASIVE CV LAB;  Service: Cardiovascular;  Laterality: N/A;   SVT ABLATION N/A 04/25/2021    Procedure: SVT ABLATION;  Surgeon: Regan Lemming, MD;  Location: [facility]  INVASIVE CV LAB;  Service: Cardiovascular;  Laterality: N/A;   TEE WITHOUT CARDIOVERSION N/A 08/19/2019    Procedure: TRANSESOPHAGEAL ECHOCARDIOGRAM (TEE);  Surgeon: Lewayne Bunting, MD;  Location[facility] ENDOSCOPY;  Service: Cardiovascular;  Laterality: N/A;   TONSILLECTOMY AND ADENOIDECTOMY   1945   TOTAL KNEE ARTHROPLASTY Right ~ 1996   TUBAL LIGATION   ~ 1976      Social History Sheriden C Dudek  reports that she has never smoked. She has never used smokeless tobacco. She reports that she does not drink alcohol and does not use drugs.   family history includes Heart attack in her brother; Heart disease in her brother; Heart disease (age of onset: 68) in her father; Hypertension in her mother;  Kidney disease in her mother; Other in her son; Suicidality in her son.        Allergies  Allergen Reactions   Tape Other (See Comments)      Band aides, adhesive tape Redness and pulls skin off   Tramadol Other (See Comments)      Pt has prolonged Qtc interval of 540- cannot give tramadol per pharmacy   Morphine Itching and Rash      Flushing          PHYSICAL EXAMINATION: Vital signs: BP 116/60   Pulse 80   Ht  (1.575 m)   Wt 149 lb (67.6 kg)   SpO2 98%   BMI 27.25 kg/m   Constitutional: Pleasant elderly female, generally well-appearing, no acute distress Psychiatric: alert and oriented x3, cooperative Eyes: extraocular movements intact, anicteric, conjunctiva pink Mouth: oral pharynx moist, no lesions Neck: supple no lymphadenopathy Cardiovascular: heart regular rate and rhythm, no murmur Lungs: clear to auscultation bilaterally Abdomen: soft, nontender, nondistended, no obvious ascites, no peritoneal signs, normal bowel sounds, no organomegaly Rectal: Omitted Extremities: no clubbing or cyanosis.  Trace lower extremity edema bilaterally Skin: no lesions on visible extremities, though violaceous hue to the lower extremities bilaterally Neuro: No focal deficits.  No nerves  intact   ASSESSMENT:   1.  GERD with history of peptic stricture large hiatal hernia and prior fundoplication.   2.  Esophageal dysmotility, primary versus secondary 3.  Prior problems with dysphagia treated with a combination of Botox injection and balloon dilation of the esophagus with success.  Recent treatment with Botox alone, not helpful.  Currently having problems with significant dysphagia as described. 4.  Transient coffee-ground emesis and dark stools. 5.  Multiple general medical problems, including history of MI and A-fib on Eliquis     PLAN:   1.  Prescribe pantoprazole 40 mg p.o. twice daily.  This because of breakthrough reflux symptoms and significant esophagitis on recent endoscopy as well as problems with coffee-ground emesis 2.  Check CBC today with transient dark stools and coffee-ground emesis 3.  Schedule upper endoscopy with Botox injection and esophageal dilation via balloon.  At the hospital due to multiple comorbidities.  Patient is HIGH RISK due to her age and comorbidities.  She understands. 4.  Hold Eliquis 2 days prior to the procedure.  Anticipate resumption shortly post procedure 5.  Chew food well in the interim 6.  Ongoing general and specialty medical care with PCP and other specialists   No interval change from recent H&P as outlined above.  She has held her blood thinner as requested.  She has had cardiac clearance as requested.  Now for EGD with Botox injection and dilation.

## 2023-02-24 NOTE — Anesthesia Postprocedure Evaluation (Signed)
Anesthesia Post Note  Patient: Julie Rice  Procedure(s) Performed: ESOPHAGOGASTRODUODENOSCOPY (EGD) WITH PROPOFOL BOTOX INJECTION Balloon dilation wire-guided     Patient location during evaluation: PACU Anesthesia Type: MAC Level of consciousness: awake and alert Pain management: pain level controlled Vital Signs Assessment: post-procedure vital signs reviewed and stable Respiratory status: spontaneous breathing, nonlabored ventilation, respiratory function stable and patient connected to nasal cannula oxygen Cardiovascular status: stable and blood pressure returned to baseline Postop Assessment: no apparent nausea or vomiting Anesthetic complications: no  No notable events documented.  Last Vitals:  Vitals:   02/24/23 1129 02/24/23 1130  BP: (!) 193/81 (!) 185/82  Pulse: 71 69  Resp: 20 15  Temp:    SpO2: 92% 97%    Last Pain:  Vitals:   02/24/23 1130  TempSrc:   PainSc: 0-No pain                 Shelton Silvas

## 2023-02-24 NOTE — Telephone Encounter (Signed)
Pt saw Dr Tenny Craw on 4/19 with following rec: "Preop risk assessment   Pt being evaluated for EGD    From cardiac standpoint I think she is low risk and OK to proceed   HR can be monitored on tele    With Hx of TIA would hold Eliquis the day prior"

## 2023-02-25 DIAGNOSIS — I6529 Occlusion and stenosis of unspecified carotid artery: Secondary | ICD-10-CM | POA: Diagnosis not present

## 2023-02-25 DIAGNOSIS — J4489 Other specified chronic obstructive pulmonary disease: Secondary | ICD-10-CM | POA: Diagnosis not present

## 2023-02-25 DIAGNOSIS — I480 Paroxysmal atrial fibrillation: Secondary | ICD-10-CM | POA: Diagnosis not present

## 2023-02-25 DIAGNOSIS — I429 Cardiomyopathy, unspecified: Secondary | ICD-10-CM | POA: Diagnosis not present

## 2023-02-25 DIAGNOSIS — I5032 Chronic diastolic (congestive) heart failure: Secondary | ICD-10-CM | POA: Diagnosis not present

## 2023-02-25 DIAGNOSIS — M0609 Rheumatoid arthritis without rheumatoid factor, multiple sites: Secondary | ICD-10-CM | POA: Diagnosis not present

## 2023-02-25 DIAGNOSIS — I471 Supraventricular tachycardia, unspecified: Secondary | ICD-10-CM | POA: Diagnosis not present

## 2023-02-25 DIAGNOSIS — I110 Hypertensive heart disease with heart failure: Secondary | ICD-10-CM | POA: Diagnosis not present

## 2023-02-25 DIAGNOSIS — I2510 Atherosclerotic heart disease of native coronary artery without angina pectoris: Secondary | ICD-10-CM | POA: Diagnosis not present

## 2023-02-25 DIAGNOSIS — R1310 Dysphagia, unspecified: Secondary | ICD-10-CM | POA: Diagnosis not present

## 2023-02-25 DIAGNOSIS — I083 Combined rheumatic disorders of mitral, aortic and tricuspid valves: Secondary | ICD-10-CM | POA: Diagnosis not present

## 2023-02-25 DIAGNOSIS — G609 Hereditary and idiopathic neuropathy, unspecified: Secondary | ICD-10-CM | POA: Diagnosis not present

## 2023-02-25 DIAGNOSIS — K222 Esophageal obstruction: Secondary | ICD-10-CM | POA: Diagnosis not present

## 2023-02-25 DIAGNOSIS — R600 Localized edema: Secondary | ICD-10-CM | POA: Diagnosis not present

## 2023-02-25 DIAGNOSIS — I700 Atherosclerosis of aorta: Secondary | ICD-10-CM | POA: Diagnosis not present

## 2023-02-25 DIAGNOSIS — M159 Polyosteoarthritis, unspecified: Secondary | ICD-10-CM | POA: Diagnosis not present

## 2023-02-25 DIAGNOSIS — J9621 Acute and chronic respiratory failure with hypoxia: Secondary | ICD-10-CM | POA: Diagnosis not present

## 2023-02-26 ENCOUNTER — Other Ambulatory Visit: Payer: Self-pay | Admitting: Adult Health

## 2023-02-26 DIAGNOSIS — J9621 Acute and chronic respiratory failure with hypoxia: Secondary | ICD-10-CM | POA: Diagnosis not present

## 2023-02-26 DIAGNOSIS — M159 Polyosteoarthritis, unspecified: Secondary | ICD-10-CM | POA: Diagnosis not present

## 2023-02-26 DIAGNOSIS — I480 Paroxysmal atrial fibrillation: Secondary | ICD-10-CM | POA: Diagnosis not present

## 2023-02-26 DIAGNOSIS — Z79899 Other long term (current) drug therapy: Secondary | ICD-10-CM

## 2023-02-26 DIAGNOSIS — I429 Cardiomyopathy, unspecified: Secondary | ICD-10-CM | POA: Diagnosis not present

## 2023-02-26 DIAGNOSIS — I083 Combined rheumatic disorders of mitral, aortic and tricuspid valves: Secondary | ICD-10-CM | POA: Diagnosis not present

## 2023-02-26 DIAGNOSIS — R0600 Dyspnea, unspecified: Secondary | ICD-10-CM

## 2023-02-26 DIAGNOSIS — I471 Supraventricular tachycardia, unspecified: Secondary | ICD-10-CM | POA: Diagnosis not present

## 2023-02-26 DIAGNOSIS — I6529 Occlusion and stenosis of unspecified carotid artery: Secondary | ICD-10-CM | POA: Diagnosis not present

## 2023-02-26 DIAGNOSIS — I700 Atherosclerosis of aorta: Secondary | ICD-10-CM | POA: Diagnosis not present

## 2023-02-26 DIAGNOSIS — I5032 Chronic diastolic (congestive) heart failure: Secondary | ICD-10-CM | POA: Diagnosis not present

## 2023-02-26 DIAGNOSIS — I10 Essential (primary) hypertension: Secondary | ICD-10-CM | POA: Diagnosis not present

## 2023-02-26 DIAGNOSIS — J4489 Other specified chronic obstructive pulmonary disease: Secondary | ICD-10-CM | POA: Diagnosis not present

## 2023-02-26 DIAGNOSIS — M0609 Rheumatoid arthritis without rheumatoid factor, multiple sites: Secondary | ICD-10-CM | POA: Diagnosis not present

## 2023-02-26 DIAGNOSIS — R1310 Dysphagia, unspecified: Secondary | ICD-10-CM | POA: Diagnosis not present

## 2023-02-26 DIAGNOSIS — K222 Esophageal obstruction: Secondary | ICD-10-CM | POA: Diagnosis not present

## 2023-02-26 DIAGNOSIS — I2510 Atherosclerotic heart disease of native coronary artery without angina pectoris: Secondary | ICD-10-CM | POA: Diagnosis not present

## 2023-02-26 DIAGNOSIS — G609 Hereditary and idiopathic neuropathy, unspecified: Secondary | ICD-10-CM | POA: Diagnosis not present

## 2023-02-26 DIAGNOSIS — I110 Hypertensive heart disease with heart failure: Secondary | ICD-10-CM | POA: Diagnosis not present

## 2023-02-28 ENCOUNTER — Encounter (HOSPITAL_COMMUNITY): Payer: Self-pay | Admitting: Internal Medicine

## 2023-03-04 DIAGNOSIS — I480 Paroxysmal atrial fibrillation: Secondary | ICD-10-CM | POA: Diagnosis not present

## 2023-03-04 DIAGNOSIS — I700 Atherosclerosis of aorta: Secondary | ICD-10-CM | POA: Diagnosis not present

## 2023-03-04 DIAGNOSIS — I6529 Occlusion and stenosis of unspecified carotid artery: Secondary | ICD-10-CM | POA: Diagnosis not present

## 2023-03-04 DIAGNOSIS — M0609 Rheumatoid arthritis without rheumatoid factor, multiple sites: Secondary | ICD-10-CM | POA: Diagnosis not present

## 2023-03-04 DIAGNOSIS — J4489 Other specified chronic obstructive pulmonary disease: Secondary | ICD-10-CM | POA: Diagnosis not present

## 2023-03-04 DIAGNOSIS — M159 Polyosteoarthritis, unspecified: Secondary | ICD-10-CM | POA: Diagnosis not present

## 2023-03-04 DIAGNOSIS — I083 Combined rheumatic disorders of mitral, aortic and tricuspid valves: Secondary | ICD-10-CM | POA: Diagnosis not present

## 2023-03-04 DIAGNOSIS — I471 Supraventricular tachycardia, unspecified: Secondary | ICD-10-CM | POA: Diagnosis not present

## 2023-03-04 DIAGNOSIS — G609 Hereditary and idiopathic neuropathy, unspecified: Secondary | ICD-10-CM | POA: Diagnosis not present

## 2023-03-04 DIAGNOSIS — I2510 Atherosclerotic heart disease of native coronary artery without angina pectoris: Secondary | ICD-10-CM | POA: Diagnosis not present

## 2023-03-04 DIAGNOSIS — I110 Hypertensive heart disease with heart failure: Secondary | ICD-10-CM | POA: Diagnosis not present

## 2023-03-04 DIAGNOSIS — I429 Cardiomyopathy, unspecified: Secondary | ICD-10-CM | POA: Diagnosis not present

## 2023-03-04 DIAGNOSIS — J9621 Acute and chronic respiratory failure with hypoxia: Secondary | ICD-10-CM | POA: Diagnosis not present

## 2023-03-04 DIAGNOSIS — I5032 Chronic diastolic (congestive) heart failure: Secondary | ICD-10-CM | POA: Diagnosis not present

## 2023-03-04 DIAGNOSIS — R1310 Dysphagia, unspecified: Secondary | ICD-10-CM | POA: Diagnosis not present

## 2023-03-04 DIAGNOSIS — K222 Esophageal obstruction: Secondary | ICD-10-CM | POA: Diagnosis not present

## 2023-03-05 DIAGNOSIS — I5032 Chronic diastolic (congestive) heart failure: Secondary | ICD-10-CM | POA: Diagnosis not present

## 2023-03-05 DIAGNOSIS — J4489 Other specified chronic obstructive pulmonary disease: Secondary | ICD-10-CM | POA: Diagnosis not present

## 2023-03-05 DIAGNOSIS — I429 Cardiomyopathy, unspecified: Secondary | ICD-10-CM | POA: Diagnosis not present

## 2023-03-05 DIAGNOSIS — I480 Paroxysmal atrial fibrillation: Secondary | ICD-10-CM | POA: Diagnosis not present

## 2023-03-05 DIAGNOSIS — I471 Supraventricular tachycardia, unspecified: Secondary | ICD-10-CM | POA: Diagnosis not present

## 2023-03-05 DIAGNOSIS — M159 Polyosteoarthritis, unspecified: Secondary | ICD-10-CM | POA: Diagnosis not present

## 2023-03-05 DIAGNOSIS — J9621 Acute and chronic respiratory failure with hypoxia: Secondary | ICD-10-CM | POA: Diagnosis not present

## 2023-03-05 DIAGNOSIS — I083 Combined rheumatic disorders of mitral, aortic and tricuspid valves: Secondary | ICD-10-CM | POA: Diagnosis not present

## 2023-03-05 DIAGNOSIS — I6529 Occlusion and stenosis of unspecified carotid artery: Secondary | ICD-10-CM | POA: Diagnosis not present

## 2023-03-05 DIAGNOSIS — G609 Hereditary and idiopathic neuropathy, unspecified: Secondary | ICD-10-CM | POA: Diagnosis not present

## 2023-03-05 DIAGNOSIS — I110 Hypertensive heart disease with heart failure: Secondary | ICD-10-CM | POA: Diagnosis not present

## 2023-03-05 DIAGNOSIS — K222 Esophageal obstruction: Secondary | ICD-10-CM | POA: Diagnosis not present

## 2023-03-05 DIAGNOSIS — I700 Atherosclerosis of aorta: Secondary | ICD-10-CM | POA: Diagnosis not present

## 2023-03-05 DIAGNOSIS — I2510 Atherosclerotic heart disease of native coronary artery without angina pectoris: Secondary | ICD-10-CM | POA: Diagnosis not present

## 2023-03-05 DIAGNOSIS — M0609 Rheumatoid arthritis without rheumatoid factor, multiple sites: Secondary | ICD-10-CM | POA: Diagnosis not present

## 2023-03-05 DIAGNOSIS — R1310 Dysphagia, unspecified: Secondary | ICD-10-CM | POA: Diagnosis not present

## 2023-03-10 ENCOUNTER — Ambulatory Visit: Payer: Medicare Other | Attending: Internal Medicine

## 2023-03-10 DIAGNOSIS — Z79899 Other long term (current) drug therapy: Secondary | ICD-10-CM

## 2023-03-10 DIAGNOSIS — I5032 Chronic diastolic (congestive) heart failure: Secondary | ICD-10-CM

## 2023-03-10 DIAGNOSIS — I480 Paroxysmal atrial fibrillation: Secondary | ICD-10-CM

## 2023-03-11 DIAGNOSIS — I480 Paroxysmal atrial fibrillation: Secondary | ICD-10-CM | POA: Diagnosis not present

## 2023-03-11 DIAGNOSIS — K222 Esophageal obstruction: Secondary | ICD-10-CM | POA: Diagnosis not present

## 2023-03-11 DIAGNOSIS — I6529 Occlusion and stenosis of unspecified carotid artery: Secondary | ICD-10-CM | POA: Diagnosis not present

## 2023-03-11 DIAGNOSIS — M0609 Rheumatoid arthritis without rheumatoid factor, multiple sites: Secondary | ICD-10-CM | POA: Diagnosis not present

## 2023-03-11 DIAGNOSIS — I5032 Chronic diastolic (congestive) heart failure: Secondary | ICD-10-CM | POA: Diagnosis not present

## 2023-03-11 DIAGNOSIS — R1310 Dysphagia, unspecified: Secondary | ICD-10-CM | POA: Diagnosis not present

## 2023-03-11 DIAGNOSIS — I700 Atherosclerosis of aorta: Secondary | ICD-10-CM | POA: Diagnosis not present

## 2023-03-11 DIAGNOSIS — I429 Cardiomyopathy, unspecified: Secondary | ICD-10-CM | POA: Diagnosis not present

## 2023-03-11 DIAGNOSIS — I083 Combined rheumatic disorders of mitral, aortic and tricuspid valves: Secondary | ICD-10-CM | POA: Diagnosis not present

## 2023-03-11 DIAGNOSIS — I110 Hypertensive heart disease with heart failure: Secondary | ICD-10-CM | POA: Diagnosis not present

## 2023-03-11 DIAGNOSIS — I471 Supraventricular tachycardia, unspecified: Secondary | ICD-10-CM | POA: Diagnosis not present

## 2023-03-11 DIAGNOSIS — J4489 Other specified chronic obstructive pulmonary disease: Secondary | ICD-10-CM | POA: Diagnosis not present

## 2023-03-11 DIAGNOSIS — G609 Hereditary and idiopathic neuropathy, unspecified: Secondary | ICD-10-CM | POA: Diagnosis not present

## 2023-03-11 DIAGNOSIS — M159 Polyosteoarthritis, unspecified: Secondary | ICD-10-CM | POA: Diagnosis not present

## 2023-03-11 DIAGNOSIS — I2510 Atherosclerotic heart disease of native coronary artery without angina pectoris: Secondary | ICD-10-CM | POA: Diagnosis not present

## 2023-03-11 DIAGNOSIS — J9621 Acute and chronic respiratory failure with hypoxia: Secondary | ICD-10-CM | POA: Diagnosis not present

## 2023-03-11 LAB — BASIC METABOLIC PANEL
BUN/Creatinine Ratio: 19 (ref 12–28)
BUN: 15 mg/dL (ref 8–27)
CO2: 24 mmol/L (ref 20–29)
Calcium: 9.2 mg/dL (ref 8.7–10.3)
Chloride: 100 mmol/L (ref 96–106)
Creatinine, Ser: 0.8 mg/dL (ref 0.57–1.00)
Glucose: 80 mg/dL (ref 70–99)
Potassium: 5.2 mmol/L (ref 3.5–5.2)
Sodium: 139 mmol/L (ref 134–144)
eGFR: 72 mL/min/{1.73_m2} (ref >59)

## 2023-03-11 LAB — PRO B NATRIURETIC PEPTIDE: NT-Pro BNP: 2500 pg/mL — ABNORMAL HIGH (ref 0–738)

## 2023-03-12 ENCOUNTER — Telehealth: Payer: Self-pay

## 2023-03-12 DIAGNOSIS — I480 Paroxysmal atrial fibrillation: Secondary | ICD-10-CM

## 2023-03-12 DIAGNOSIS — I10 Essential (primary) hypertension: Secondary | ICD-10-CM

## 2023-03-12 DIAGNOSIS — I2721 Secondary pulmonary arterial hypertension: Secondary | ICD-10-CM

## 2023-03-12 DIAGNOSIS — I5032 Chronic diastolic (congestive) heart failure: Secondary | ICD-10-CM

## 2023-03-12 DIAGNOSIS — J449 Chronic obstructive pulmonary disease, unspecified: Secondary | ICD-10-CM

## 2023-03-12 DIAGNOSIS — I471 Supraventricular tachycardia, unspecified: Secondary | ICD-10-CM

## 2023-03-12 DIAGNOSIS — Z79899 Other long term (current) drug therapy: Secondary | ICD-10-CM

## 2023-03-12 MED ORDER — FUROSEMIDE 40 MG PO TABS: ORAL_TABLET | ORAL | 3 refills | Status: DC

## 2023-03-12 NOTE — Telephone Encounter (Signed)
-----   Message from Pricilla Riffle, MD sent at 03/12/2023  3:32 PM EDT ----- Wilfred Curtis function is normal   Electrolytes are OK FLuid is still up How is she feeling?  Is breathing improved?   Confirm now taking lasix?

## 2023-03-12 NOTE — Telephone Encounter (Signed)
Pt advised and will have labs 03/21/23 and OV 04/04/23. She will call if anything changes or worsens prior to her coming back to see [place].

## 2023-03-12 NOTE — Telephone Encounter (Signed)
I called pt re: her lab results and she reports that both her legs are very swollen up to her knees, they are red and hurt.   She says she has some SOB when she gets up but okay when she is at rest.   She is taking, since the past weekend:  Lasix 40 mg daily KCL 20 meq daily  Spironolactone 25 mg daily  Will forward to Dr Tenny Craw for review.

## 2023-03-12 NOTE — Telephone Encounter (Signed)
Increae lasix to 80mg  alternating with 40 mg daily    Follow up BMET and BNP in 10 days SHe needs to get an appt in clinic in the next month

## 2023-03-12 NOTE — Addendum Note (Signed)
Addended by: Bertram Millard on: 03/12/2023 04:38 PM   Modules accepted: Orders

## 2023-03-16 ENCOUNTER — Other Ambulatory Visit: Payer: Self-pay | Admitting: Adult Health

## 2023-03-16 ENCOUNTER — Other Ambulatory Visit: Payer: Self-pay | Admitting: Physician Assistant

## 2023-03-16 DIAGNOSIS — F419 Anxiety disorder, unspecified: Secondary | ICD-10-CM

## 2023-03-16 DIAGNOSIS — D509 Iron deficiency anemia, unspecified: Secondary | ICD-10-CM

## 2023-03-18 DIAGNOSIS — G609 Hereditary and idiopathic neuropathy, unspecified: Secondary | ICD-10-CM | POA: Diagnosis not present

## 2023-03-18 DIAGNOSIS — I429 Cardiomyopathy, unspecified: Secondary | ICD-10-CM | POA: Diagnosis not present

## 2023-03-18 DIAGNOSIS — I6529 Occlusion and stenosis of unspecified carotid artery: Secondary | ICD-10-CM | POA: Diagnosis not present

## 2023-03-18 DIAGNOSIS — J9621 Acute and chronic respiratory failure with hypoxia: Secondary | ICD-10-CM | POA: Diagnosis not present

## 2023-03-18 DIAGNOSIS — I471 Supraventricular tachycardia, unspecified: Secondary | ICD-10-CM | POA: Diagnosis not present

## 2023-03-18 DIAGNOSIS — M159 Polyosteoarthritis, unspecified: Secondary | ICD-10-CM | POA: Diagnosis not present

## 2023-03-18 DIAGNOSIS — I5032 Chronic diastolic (congestive) heart failure: Secondary | ICD-10-CM | POA: Diagnosis not present

## 2023-03-18 DIAGNOSIS — K222 Esophageal obstruction: Secondary | ICD-10-CM | POA: Diagnosis not present

## 2023-03-18 DIAGNOSIS — I2510 Atherosclerotic heart disease of native coronary artery without angina pectoris: Secondary | ICD-10-CM | POA: Diagnosis not present

## 2023-03-18 DIAGNOSIS — I083 Combined rheumatic disorders of mitral, aortic and tricuspid valves: Secondary | ICD-10-CM | POA: Diagnosis not present

## 2023-03-18 DIAGNOSIS — I110 Hypertensive heart disease with heart failure: Secondary | ICD-10-CM | POA: Diagnosis not present

## 2023-03-18 DIAGNOSIS — I700 Atherosclerosis of aorta: Secondary | ICD-10-CM | POA: Diagnosis not present

## 2023-03-18 DIAGNOSIS — J4489 Other specified chronic obstructive pulmonary disease: Secondary | ICD-10-CM | POA: Diagnosis not present

## 2023-03-18 DIAGNOSIS — M0609 Rheumatoid arthritis without rheumatoid factor, multiple sites: Secondary | ICD-10-CM | POA: Diagnosis not present

## 2023-03-18 DIAGNOSIS — I480 Paroxysmal atrial fibrillation: Secondary | ICD-10-CM | POA: Diagnosis not present

## 2023-03-18 DIAGNOSIS — R1310 Dysphagia, unspecified: Secondary | ICD-10-CM | POA: Diagnosis not present

## 2023-03-18 NOTE — Telephone Encounter (Signed)
Okay for refill?  

## 2023-03-19 ENCOUNTER — Telehealth: Payer: Self-pay | Admitting: Internal Medicine

## 2023-03-19 DIAGNOSIS — I480 Paroxysmal atrial fibrillation: Secondary | ICD-10-CM | POA: Diagnosis not present

## 2023-03-19 DIAGNOSIS — I429 Cardiomyopathy, unspecified: Secondary | ICD-10-CM | POA: Diagnosis not present

## 2023-03-19 DIAGNOSIS — I471 Supraventricular tachycardia, unspecified: Secondary | ICD-10-CM | POA: Diagnosis not present

## 2023-03-19 DIAGNOSIS — K222 Esophageal obstruction: Secondary | ICD-10-CM | POA: Diagnosis not present

## 2023-03-19 DIAGNOSIS — I6529 Occlusion and stenosis of unspecified carotid artery: Secondary | ICD-10-CM | POA: Diagnosis not present

## 2023-03-19 DIAGNOSIS — I083 Combined rheumatic disorders of mitral, aortic and tricuspid valves: Secondary | ICD-10-CM | POA: Diagnosis not present

## 2023-03-19 DIAGNOSIS — I2510 Atherosclerotic heart disease of native coronary artery without angina pectoris: Secondary | ICD-10-CM | POA: Diagnosis not present

## 2023-03-19 DIAGNOSIS — J4489 Other specified chronic obstructive pulmonary disease: Secondary | ICD-10-CM | POA: Diagnosis not present

## 2023-03-19 DIAGNOSIS — G609 Hereditary and idiopathic neuropathy, unspecified: Secondary | ICD-10-CM | POA: Diagnosis not present

## 2023-03-19 DIAGNOSIS — R1310 Dysphagia, unspecified: Secondary | ICD-10-CM | POA: Diagnosis not present

## 2023-03-19 DIAGNOSIS — J9621 Acute and chronic respiratory failure with hypoxia: Secondary | ICD-10-CM | POA: Diagnosis not present

## 2023-03-19 DIAGNOSIS — M0609 Rheumatoid arthritis without rheumatoid factor, multiple sites: Secondary | ICD-10-CM | POA: Diagnosis not present

## 2023-03-19 DIAGNOSIS — I110 Hypertensive heart disease with heart failure: Secondary | ICD-10-CM | POA: Diagnosis not present

## 2023-03-19 DIAGNOSIS — I5032 Chronic diastolic (congestive) heart failure: Secondary | ICD-10-CM | POA: Diagnosis not present

## 2023-03-19 DIAGNOSIS — I700 Atherosclerosis of aorta: Secondary | ICD-10-CM | POA: Diagnosis not present

## 2023-03-19 DIAGNOSIS — M159 Polyosteoarthritis, unspecified: Secondary | ICD-10-CM | POA: Diagnosis not present

## 2023-03-19 NOTE — Telephone Encounter (Signed)
I spoke with Selena Batten [facility] home health) [phone]   She reports that the pt is continuing to have episodes longest lasting an hour this past Monday where her HR feels out of rhythm..she gets SOB and her HR shoots up to the 160-180 and her BP drops.Marland Kitchen the worse BP was Monday 80/36. She manages by sitting with her O2 and deep breaths and the it seems to pass.   She recently had a monitor... she is due for labwork this Friday and seeing Dr Tenny Craw 04/04/23.   I made an error placing her with Dr Tenny Craw for follow up not realizing she is a Dr Eden Emms pt.   Will forward to Dr Tenny Craw for review.    Hr today with home health 34 and the pt is feeling well today.

## 2023-03-19 NOTE — Telephone Encounter (Signed)
Calling to report that patient been in afib since Monday. HR gets up to 170/180 last bout 1hr/ 1.5 hr. She is having increase SOB and bp is dropping every time her HR goes up. Calling to see what patient would need to do. Please advise

## 2023-03-20 ENCOUNTER — Other Ambulatory Visit: Payer: Self-pay | Admitting: Adult Health

## 2023-03-20 DIAGNOSIS — I110 Hypertensive heart disease with heart failure: Secondary | ICD-10-CM | POA: Diagnosis not present

## 2023-03-20 DIAGNOSIS — K222 Esophageal obstruction: Secondary | ICD-10-CM | POA: Diagnosis not present

## 2023-03-20 DIAGNOSIS — I471 Supraventricular tachycardia, unspecified: Secondary | ICD-10-CM | POA: Diagnosis not present

## 2023-03-20 DIAGNOSIS — J4489 Other specified chronic obstructive pulmonary disease: Secondary | ICD-10-CM | POA: Diagnosis not present

## 2023-03-20 DIAGNOSIS — R1310 Dysphagia, unspecified: Secondary | ICD-10-CM | POA: Diagnosis not present

## 2023-03-20 DIAGNOSIS — G609 Hereditary and idiopathic neuropathy, unspecified: Secondary | ICD-10-CM | POA: Diagnosis not present

## 2023-03-20 DIAGNOSIS — I6529 Occlusion and stenosis of unspecified carotid artery: Secondary | ICD-10-CM | POA: Diagnosis not present

## 2023-03-20 DIAGNOSIS — M159 Polyosteoarthritis, unspecified: Secondary | ICD-10-CM | POA: Diagnosis not present

## 2023-03-20 DIAGNOSIS — J9621 Acute and chronic respiratory failure with hypoxia: Secondary | ICD-10-CM | POA: Diagnosis not present

## 2023-03-20 DIAGNOSIS — F419 Anxiety disorder, unspecified: Secondary | ICD-10-CM

## 2023-03-20 DIAGNOSIS — M0609 Rheumatoid arthritis without rheumatoid factor, multiple sites: Secondary | ICD-10-CM | POA: Diagnosis not present

## 2023-03-20 DIAGNOSIS — I700 Atherosclerosis of aorta: Secondary | ICD-10-CM | POA: Diagnosis not present

## 2023-03-20 DIAGNOSIS — I2510 Atherosclerotic heart disease of native coronary artery without angina pectoris: Secondary | ICD-10-CM | POA: Diagnosis not present

## 2023-03-20 DIAGNOSIS — I429 Cardiomyopathy, unspecified: Secondary | ICD-10-CM | POA: Diagnosis not present

## 2023-03-20 DIAGNOSIS — I5032 Chronic diastolic (congestive) heart failure: Secondary | ICD-10-CM | POA: Diagnosis not present

## 2023-03-20 DIAGNOSIS — I480 Paroxysmal atrial fibrillation: Secondary | ICD-10-CM | POA: Diagnosis not present

## 2023-03-20 DIAGNOSIS — I083 Combined rheumatic disorders of mitral, aortic and tricuspid valves: Secondary | ICD-10-CM | POA: Diagnosis not present

## 2023-03-20 NOTE — Telephone Encounter (Signed)
Recent monitor showed no afib   She had very short bursts of probably atrial tachycardia   VEry self limited, not long  She is on amiodarone 200 daily   She could increase to 2x per day for 1 week then drop back

## 2023-03-21 ENCOUNTER — Ambulatory Visit: Payer: Medicare Other | Attending: Internal Medicine

## 2023-03-21 DIAGNOSIS — Z79899 Other long term (current) drug therapy: Secondary | ICD-10-CM | POA: Diagnosis not present

## 2023-03-21 DIAGNOSIS — I480 Paroxysmal atrial fibrillation: Secondary | ICD-10-CM | POA: Diagnosis not present

## 2023-03-21 DIAGNOSIS — I5032 Chronic diastolic (congestive) heart failure: Secondary | ICD-10-CM

## 2023-03-21 DIAGNOSIS — I471 Supraventricular tachycardia, unspecified: Secondary | ICD-10-CM

## 2023-03-21 DIAGNOSIS — J449 Chronic obstructive pulmonary disease, unspecified: Secondary | ICD-10-CM

## 2023-03-21 DIAGNOSIS — I10 Essential (primary) hypertension: Secondary | ICD-10-CM | POA: Diagnosis not present

## 2023-03-21 DIAGNOSIS — I2721 Secondary pulmonary arterial hypertension: Secondary | ICD-10-CM

## 2023-03-21 NOTE — Telephone Encounter (Signed)
Left a message for:    Selena Batten [facility] home health) [phone]    Per her request to call her and not the pt.

## 2023-03-22 LAB — BASIC METABOLIC PANEL
BUN/Creatinine Ratio: 20 (ref 12–28)
BUN: 18 mg/dL (ref 8–27)
CO2: 21 mmol/L (ref 20–29)
Calcium: 9.2 mg/dL (ref 8.7–10.3)
Chloride: 101 mmol/L (ref 96–106)
Creatinine, Ser: 0.91 mg/dL (ref 0.57–1.00)
Glucose: 85 mg/dL (ref 70–99)
Potassium: 5.5 mmol/L — ABNORMAL HIGH (ref 3.5–5.2)
Sodium: 137 mmol/L (ref 134–144)
eGFR: 62 mL/min/{1.73_m2} (ref >59)

## 2023-03-22 LAB — PRO B NATRIURETIC PEPTIDE: NT-Pro BNP: 1817 pg/mL — ABNORMAL HIGH (ref 0–738)

## 2023-03-24 ENCOUNTER — Telehealth: Payer: Self-pay | Admitting: Internal Medicine

## 2023-03-24 DIAGNOSIS — R1310 Dysphagia, unspecified: Secondary | ICD-10-CM | POA: Diagnosis not present

## 2023-03-24 DIAGNOSIS — I471 Supraventricular tachycardia, unspecified: Secondary | ICD-10-CM | POA: Diagnosis not present

## 2023-03-24 DIAGNOSIS — J4489 Other specified chronic obstructive pulmonary disease: Secondary | ICD-10-CM | POA: Diagnosis not present

## 2023-03-24 DIAGNOSIS — I2510 Atherosclerotic heart disease of native coronary artery without angina pectoris: Secondary | ICD-10-CM | POA: Diagnosis not present

## 2023-03-24 DIAGNOSIS — I083 Combined rheumatic disorders of mitral, aortic and tricuspid valves: Secondary | ICD-10-CM | POA: Diagnosis not present

## 2023-03-24 DIAGNOSIS — I429 Cardiomyopathy, unspecified: Secondary | ICD-10-CM | POA: Diagnosis not present

## 2023-03-24 DIAGNOSIS — M0609 Rheumatoid arthritis without rheumatoid factor, multiple sites: Secondary | ICD-10-CM | POA: Diagnosis not present

## 2023-03-24 DIAGNOSIS — J9621 Acute and chronic respiratory failure with hypoxia: Secondary | ICD-10-CM | POA: Diagnosis not present

## 2023-03-24 DIAGNOSIS — G609 Hereditary and idiopathic neuropathy, unspecified: Secondary | ICD-10-CM | POA: Diagnosis not present

## 2023-03-24 DIAGNOSIS — I480 Paroxysmal atrial fibrillation: Secondary | ICD-10-CM | POA: Diagnosis not present

## 2023-03-24 DIAGNOSIS — K222 Esophageal obstruction: Secondary | ICD-10-CM | POA: Diagnosis not present

## 2023-03-24 DIAGNOSIS — I700 Atherosclerosis of aorta: Secondary | ICD-10-CM | POA: Diagnosis not present

## 2023-03-24 DIAGNOSIS — I5032 Chronic diastolic (congestive) heart failure: Secondary | ICD-10-CM | POA: Diagnosis not present

## 2023-03-24 DIAGNOSIS — I110 Hypertensive heart disease with heart failure: Secondary | ICD-10-CM | POA: Diagnosis not present

## 2023-03-24 DIAGNOSIS — M159 Polyosteoarthritis, unspecified: Secondary | ICD-10-CM | POA: Diagnosis not present

## 2023-03-24 DIAGNOSIS — I6529 Occlusion and stenosis of unspecified carotid artery: Secondary | ICD-10-CM | POA: Diagnosis not present

## 2023-03-24 NOTE — Telephone Encounter (Signed)
Julie Rice [facility] nurse calling back

## 2023-03-24 NOTE — Telephone Encounter (Signed)
Stop potassium supplement Fluid appears better than on last check though still increased Follow up  BMET in 2 wks  Left another message for the [facility] nurse Kim to please call our office back for these results and Amiodarone changes from call last week.  Can she draw the labs in 2 weeks??    Asked her to call back and ask for triage.

## 2023-03-24 NOTE — Telephone Encounter (Signed)
Inbound call from patient she is having a couple questing regarding Protonix med , she is not sure if Dr.Perry still want her on that med.Please advise.

## 2023-03-25 NOTE — Telephone Encounter (Signed)
Spoke with patient and clarified that she is to discontinue Omeprazole and take Protonix twice a day.  Patient agreed.

## 2023-03-25 NOTE — Telephone Encounter (Signed)
Left message for Kim to call back.  

## 2023-03-26 NOTE — Telephone Encounter (Signed)
I called the pt and advised her lab results and will see DR Tenny Craw 04/04/23.

## 2023-03-31 DIAGNOSIS — I700 Atherosclerosis of aorta: Secondary | ICD-10-CM | POA: Diagnosis not present

## 2023-03-31 DIAGNOSIS — I429 Cardiomyopathy, unspecified: Secondary | ICD-10-CM | POA: Diagnosis not present

## 2023-03-31 DIAGNOSIS — I471 Supraventricular tachycardia, unspecified: Secondary | ICD-10-CM | POA: Diagnosis not present

## 2023-03-31 DIAGNOSIS — R1310 Dysphagia, unspecified: Secondary | ICD-10-CM | POA: Diagnosis not present

## 2023-03-31 DIAGNOSIS — M159 Polyosteoarthritis, unspecified: Secondary | ICD-10-CM | POA: Diagnosis not present

## 2023-03-31 DIAGNOSIS — I480 Paroxysmal atrial fibrillation: Secondary | ICD-10-CM | POA: Diagnosis not present

## 2023-03-31 DIAGNOSIS — M0609 Rheumatoid arthritis without rheumatoid factor, multiple sites: Secondary | ICD-10-CM | POA: Diagnosis not present

## 2023-03-31 DIAGNOSIS — I2510 Atherosclerotic heart disease of native coronary artery without angina pectoris: Secondary | ICD-10-CM | POA: Diagnosis not present

## 2023-03-31 DIAGNOSIS — I6529 Occlusion and stenosis of unspecified carotid artery: Secondary | ICD-10-CM | POA: Diagnosis not present

## 2023-03-31 DIAGNOSIS — J9621 Acute and chronic respiratory failure with hypoxia: Secondary | ICD-10-CM | POA: Diagnosis not present

## 2023-03-31 DIAGNOSIS — G609 Hereditary and idiopathic neuropathy, unspecified: Secondary | ICD-10-CM | POA: Diagnosis not present

## 2023-03-31 DIAGNOSIS — K222 Esophageal obstruction: Secondary | ICD-10-CM | POA: Diagnosis not present

## 2023-03-31 DIAGNOSIS — I083 Combined rheumatic disorders of mitral, aortic and tricuspid valves: Secondary | ICD-10-CM | POA: Diagnosis not present

## 2023-03-31 DIAGNOSIS — I110 Hypertensive heart disease with heart failure: Secondary | ICD-10-CM | POA: Diagnosis not present

## 2023-03-31 DIAGNOSIS — J4489 Other specified chronic obstructive pulmonary disease: Secondary | ICD-10-CM | POA: Diagnosis not present

## 2023-03-31 DIAGNOSIS — I5032 Chronic diastolic (congestive) heart failure: Secondary | ICD-10-CM | POA: Diagnosis not present

## 2023-04-01 ENCOUNTER — Telehealth: Payer: Self-pay | Admitting: Adult Health

## 2023-04-01 ENCOUNTER — Encounter: Payer: Self-pay | Admitting: Adult Health

## 2023-04-01 ENCOUNTER — Ambulatory Visit (INDEPENDENT_AMBULATORY_CARE_PROVIDER_SITE_OTHER): Payer: Medicare Other | Admitting: Adult Health

## 2023-04-01 DIAGNOSIS — F339 Major depressive disorder, recurrent, unspecified: Secondary | ICD-10-CM | POA: Diagnosis not present

## 2023-04-01 MED ORDER — BUPROPION HCL ER (XL) 300 MG PO TB24
300.0000 mg | ORAL_TABLET | Freq: Every day | ORAL | 1 refills | Status: DC
Start: 2023-04-01 — End: 2023-12-12

## 2023-04-01 NOTE — Progress Notes (Signed)
Subjective:    Patient ID: [name], female    DOB: 12/28/1935, 87 y.o.   MRN: [id number]  HPI 87 year old female who  has a past medical history of A-fib (HCC), Anemia, Anxiety, Bipolar disorder (HCC), Blood transfusion (1991), CAD (coronary artery disease), Cataract, Chest pain, CHF (congestive heart failure) (HCC) (07/2020), Chronic back pain, COPD (chronic obstructive pulmonary disease) (HCC), Depression, Esophageal stricture, Fibromyalgia, GERD (gastroesophageal reflux disease), Heart murmur, Hiatal hernia, Hyperlipidemia, Hypertension, Internal hemorrhoids, Lumbago, Mitral regurgitation, Osteoarthritis, Osteoporosis, Pneumonia (~ 01/2018), PONV (postoperative nausea and vomiting), Rectal bleeding, Rheumatoid arthritis (HCC), Spondylosis, TIA (transient ischemic attack) (2013), and Urinary incontinence.  She presents to the office today for anxiety and depression. She needs a refill of Wellbutrin 300 mg XR. She is taking Klonopin 0.5 mg daily and Cymbalta 60 mg  daily. . She feels as though these medications are helpful for her.    Review of Systems See HPI   Past Medical History:  Diagnosis Date   A-fib (HCC)    Anemia    Anxiety    Bipolar disorder (HCC)    Blood transfusion 1991   autologous pts own blood given    CAD (coronary artery disease)    Cataract    Chest pain    "@ rest, lying down, w/exertion"   CHF (congestive heart failure) (HCC) 07/2020   Chronic back pain    "mostly lower back but I do have upper back pain regularly" (04/06/2018)   COPD (chronic obstructive pulmonary disease) (HCC)    Depression    Esophageal stricture    Fibromyalgia    GERD (gastroesophageal reflux disease)    Heart murmur    "slight" (04/06/2018)   Hiatal hernia    Hyperlipidemia    Patient denies   Hypertension    Internal hemorrhoids    Lumbago    Mitral regurgitation    Osteoarthritis    Osteoporosis    Pneumonia ~ 01/2018   PONV (postoperative nausea and vomiting)     severe ponv, "in the past" (04/06/2018)   Rectal bleeding    Rheumatoid arthritis (HCC)    Spondylosis    TIA (transient ischemic attack) 2013   Urinary incontinence    wears depends    Social History   Socioeconomic History   Marital status: Widowed    Spouse name: Not on file   Number of children: 2   Years of education: 3   Highest education level: 12th grade  Occupational History   Occupation: retired    Associate Professor: RETIRED  Tobacco Use   Smoking status: Never   Smokeless tobacco: Never  Vaping Use   Vaping Use: Never used  Substance and Sexual Activity   Alcohol use: Never   Drug use: Never   Sexual activity: Not Currently    Comment: 1st intercourse 5 yo-1 partner  Other Topics Concern   Not on file  Social History Narrative   Retired    Widowed   Right handed   Social Determinants of Health   Financial Resource Strain: Low Risk  (02/14/2022)   Overall Financial Resource Strain (CARDIA)    Difficulty of Paying Living Expenses: Not hard at all  Food Insecurity: No Food Insecurity (12/08/2022)   Hunger Vital Sign    Worried About Running Out of Food in the Last Year: Never true    Ran Out of Food in the Last Year: Never true  Transportation Needs: No Transportation Needs (12/08/2022)   PRAPARE - Transportation  Lack of Transportation (Medical): No    Lack of Transportation (Non-Medical): No  Physical Activity: Inactive (02/14/2022)   Exercise Vital Sign    Days of Exercise per Week: 0 days    Minutes of Exercise per Session: 0 min  Stress: No Stress Concern Present (02/14/2022)   [employer] of Occupational Health - Occupational Stress Questionnaire    Feeling of Stress : Not at all  Social Connections: Moderately Integrated (02/14/2022)   Social Connection and Isolation Panel [NHANES]    Frequency of Communication with Friends and Family: More than three times a week    Frequency of Social Gatherings with Friends and Family: More than three times a week     Attends Religious Services: More than 4 times per year    Active Member of [employer] or Organizations: Yes    Attends Banker Meetings: More than 4 times per year    Marital Status: Widowed  Intimate Partner Violence: Not At Risk (12/08/2022)   Humiliation, Afraid, Rape, and Kick questionnaire    Fear of Current or Ex-Partner: No    Emotionally Abused: No    Physically Abused: No    Sexually Abused: No    Past Surgical History:  Procedure Laterality Date   ABDOMINAL HYSTERECTOMY  1982   BACK SURGERY     BALLOON DILATION N/A 09/21/2018   Procedure: BALLOON DILATION;  Surgeon: Hilarie Fredrickson, MD;  Location: [facility] ENDOSCOPY;  Service: Endoscopy;  Laterality: N/A;   BALLOON DILATION N/A 08/12/2019   Procedure: BALLOON DILATION;  Surgeon: Lynann Bologna, MD;  Location: [facility] ENDOSCOPY;  Service: Endoscopy;  Laterality: N/A;   BIOPSY  08/12/2019   Procedure: BIOPSY;  Surgeon: Lynann Bologna, MD;  Location: [facility] ENDOSCOPY;  Service: Endoscopy;;   BLADDER SUSPENSION  1980's   BOTOX INJECTION N/A 09/21/2018   Procedure: BOTOX INJECTION;  Surgeon: Hilarie Fredrickson, MD;  Location: [facility] ENDOSCOPY;  Service: Endoscopy;  Laterality: N/A;   BOTOX INJECTION N/A 08/12/2019   Procedure: BOTOX INJECTION;  Surgeon: Lynann Bologna, MD;  Location: [facility] ENDOSCOPY;  Service: Endoscopy;  Laterality: N/A;   BOTOX INJECTION N/A 09/27/2022   Procedure: BOTOX INJECTION;  Surgeon: Sherrilyn Rist, MD;  Location: [facility] ENDOSCOPY;  Service: Gastroenterology;  Laterality: N/A;   BOTOX INJECTION N/A 02/24/2023   Procedure: BOTOX INJECTION;  Surgeon: Hilarie Fredrickson, MD;  Location: [facility] ENDOSCOPY;  Service: Gastroenterology;  Laterality: N/A;   CATARACT EXTRACTION W/ INTRAOCULAR LENS  IMPLANT, BILATERAL Bilateral 2010   DILATION AND CURETTAGE OF UTERUS  1961   ESOPHAGEAL MANOMETRY N/A 03/25/2018   Procedure: ESOPHAGEAL MANOMETRY (EM);  Surgeon: Napoleon Form, MD;  Location: [facility] ENDOSCOPY;  Service: Endoscopy;  Laterality: N/A;    ESOPHAGOGASTRODUODENOSCOPY (EGD) WITH PROPOFOL N/A 04/07/2018   Procedure: ESOPHAGOGASTRODUODENOSCOPY (EGD) WITH PROPOFOL;  Surgeon: Sherrilyn Rist, MD;  Location: [facility] ENDOSCOPY;  Service: Gastroenterology;  Laterality: N/A;   ESOPHAGOGASTRODUODENOSCOPY (EGD) WITH PROPOFOL N/A 09/21/2018   Procedure: ESOPHAGOGASTRODUODENOSCOPY (EGD) WITH PROPOFOL;  Surgeon: Hilarie Fredrickson, MD;  Location: [facility] ENDOSCOPY;  Service: Endoscopy;  Laterality: N/A;   ESOPHAGOGASTRODUODENOSCOPY (EGD) WITH PROPOFOL N/A 08/12/2019   Procedure: ESOPHAGOGASTRODUODENOSCOPY (EGD) WITH PROPOFOL;  Surgeon: Lynann Bologna, MD;  Location: [facility] ENDOSCOPY;  Service: Endoscopy;  Laterality: N/A;   ESOPHAGOGASTRODUODENOSCOPY (EGD) WITH PROPOFOL N/A 09/27/2022   Procedure: ESOPHAGOGASTRODUODENOSCOPY (EGD) WITH PROPOFOL;  Surgeon: Sherrilyn Rist, MD;  Location: [facility] ENDOSCOPY;  Service: Gastroenterology;  Laterality: N/A;   ESOPHAGOGASTRODUODENOSCOPY (EGD) WITH PROPOFOL N/A 02/24/2023   Procedure:  ESOPHAGOGASTRODUODENOSCOPY (EGD) WITH PROPOFOL;  Surgeon: Hilarie Fredrickson, MD;  Location: [facility] ENDOSCOPY;  Service: Gastroenterology;  Laterality: N/A;   HERNIA REPAIR     HIATAL HERNIA REPAIR N/A 08/02/2016   Procedure: LAPAROSCOPIC REPAIR OF LARGE  HIATAL HERNIA;  Surgeon: Glenna Fellows, MD;  Location: [facility] ORS;  Service: General;  Laterality: N/A;   JOINT REPLACEMENT     LAPAROSCOPIC NISSEN FUNDOPLICATION N/A 08/02/2016   Procedure: LAPAROSCOPIC NISSEN FUNDOPLICATION;  Surgeon: Glenna Fellows, MD;  Location: [facility] ORS;  Service: General;  Laterality: N/A;   LUMBAR LAMINECTOMY  1990; 1994; [id number]   "I've got 2 stainless steel rods; 6 screws; 2 ray cages"took bone from right hip to put in back   RIGHT/LEFT HEART CATH AND CORONARY ANGIOGRAPHY N/A 07/26/2019   Procedure: RIGHT/LEFT HEART CATH AND CORONARY ANGIOGRAPHY;  Surgeon: Lyn Records, MD;  Location: [facility] INVASIVE CV LAB;  Service: Cardiovascular;  Laterality: N/A;   SVT ABLATION N/A  04/25/2021   Procedure: SVT ABLATION;  Surgeon: Regan Lemming, MD;  Location: [facility] INVASIVE CV LAB;  Service: Cardiovascular;  Laterality: N/A;   TEE WITHOUT CARDIOVERSION N/A 08/19/2019   Procedure: TRANSESOPHAGEAL ECHOCARDIOGRAM (TEE);  Surgeon: Lewayne Bunting, MD;  Location: [facility] ENDOSCOPY;  Service: Cardiovascular;  Laterality: N/A;   TONSILLECTOMY AND ADENOIDECTOMY  1945   TOTAL KNEE ARTHROPLASTY Right ~ 1996   TUBAL LIGATION  ~ 1976    Family History  Problem Relation Age of Onset   Kidney disease Mother    Hypertension Mother    Heart disease Father 50       die of MI at age 59   Heart disease Brother    Heart attack Brother    Suicidality Son    Other Son        MVA   Colon cancer Neg Hx    Esophageal cancer Neg Hx    Pancreatic cancer Neg Hx    Rectal cancer Neg Hx    Stomach cancer Neg Hx     Allergies  Allergen Reactions   Tape Other (See Comments)    Band aides, adhesive tape Redness and pulls skin off   Tramadol Other (See Comments)    Pt has prolonged Qtc interval of 540- cannot give tramadol per pharmacy   Morphine Itching and Rash    Flushing    Current Outpatient Medications on File Prior to Visit  Medication Sig Dispense Refill   acetaminophen (TYLENOL) 500 MG tablet Take 1,000 mg by mouth every 6 (six) hours as needed for moderate pain, headache or fever.     albuterol (VENTOLIN HFA) 108 (90 Base) MCG/ACT inhaler INHALE TWO PUFFS BY MOUTH EVERY 6 HOURS AS NEEDED FOR WHEEZING OR SHORTNESS OF BREATH 8.5 g 3   amiodarone (PACERONE) 200 MG tablet NEW PRESCRIPTION REQUEST: TAKE ONE TABLET BY MOUTH DAILY 90 tablet 3   buPROPion (WELLBUTRIN XL) 300 MG 24 hr tablet TAKE 1 TABLET BY MOUTH EVERY DAY 90 tablet 1   Carboxymethylcellulose Sodium (REFRESH TEARS OP) Place 1 drop into both eyes 2 (two) times daily.     cetirizine (ZYRTEC) 10 MG tablet Take 1 tablet (10 mg total) by mouth daily as needed for allergies. 90 tablet 0   clonazePAM (KLONOPIN) 0.5 MG  tablet Take 1 tablet (0.5 mg total) by mouth 2 (two) times daily. 60 tablet 2   DULoxetine (CYMBALTA) 60 MG capsule Take 1 capsule (60 mg total) by mouth daily. 90 capsule 1   ELIQUIS 5 MG TABS tablet  TAKE 1 TABLET BY MOUTH TWICE A DAY 180 tablet 1   FeFum-FePoly-FA-B Cmp-C-Biot (FOLIVANE-PLUS) CAPS TAKE ONE CAPSULE BY MOUTH IN THE EARLY MORNING 90 capsule 0   furosemide (LASIX) 40 MG tablet Take one tablet (40 mg) by mouth every other day alternating with two tablets (80 mg) on the opposite days. 90 tablet 3   losartan (COZAAR) 50 MG tablet TAKE 1 TABLET BY MOUTH EVERY DAY 90 tablet 1   metoprolol tartrate (LOPRESSOR) 25 MG tablet Take 0.5 tablets (12.5 mg total) by mouth 2 (two) times daily. 90 tablet 1   Multiple Vitamins-Minerals (ONE-A-DAY WOMENS 50+) TABS Take 1 tablet by mouth in the morning.     pantoprazole (PROTONIX) 40 MG tablet Take 1 tablet (40 mg total) by mouth 2 (two) times daily. 60 tablet 11   rosuvastatin (CRESTOR) 20 MG tablet Take 1 tablet (20 mg total) by mouth daily at 6 PM. 90 tablet 1   sodium chloride (OCEAN) 0.65 % SOLN nasal spray Place 1 spray into both nostrils at bedtime.     spironolactone (ALDACTONE) 25 MG tablet NEW PRESCRIPTION REQUEST: TAKE ONE TABLET BY MOUTH DAILY 90 tablet 3   No current facility-administered medications on file prior to visit.    BP (!) 150/70 (BP Location: Left Arm, Patient Position: Sitting, Cuff Size: Normal)   Pulse (!) 59   Temp 97.6 F (36.4 C) (Oral)   Ht 5\' 1"  (1.549 m)   Wt 155 lb 9.6 oz (70.6 kg)   SpO2 98%   BMI 29.40 kg/m       Objective:   Physical Exam Vitals and nursing note reviewed.  Constitutional:      Appearance: Normal appearance.  Cardiovascular:     Rate and Rhythm: Normal rate and regular rhythm.     Pulses: Normal pulses.     Heart sounds: Normal heart sounds.  Pulmonary:     Effort: Pulmonary effort is normal.     Breath sounds: Normal breath sounds.  Skin:    General: Skin is warm and dry.   Neurological:     General: No focal deficit present.     Mental Status: She is alert and oriented to person, place, and time.     Motor: Weakness present.     Gait: Gait abnormal (in wheelchair).  Psychiatric:        Mood and Affect: Mood normal.        Behavior: Behavior normal.        Thought Content: Thought content normal.        Judgment: Judgment normal.       Assessment & Plan:   1. Depression, recurrent (HCC) - Refill sent in  - buPROPion (WELLBUTRIN XL) 300 MG 24 hr tablet; Take 1 tablet (300 mg total) by mouth daily.  Dispense: 90 tablet; Refill: 1   Shirline Frees, NP

## 2023-04-01 NOTE — Telephone Encounter (Signed)
Left a detailed message informing [name] of verbal OK

## 2023-04-01 NOTE — Telephone Encounter (Signed)
[name] OT with [facility] is calling and need VO to move OT discharge to next wek

## 2023-04-02 DIAGNOSIS — M1712 Unilateral primary osteoarthritis, left knee: Secondary | ICD-10-CM | POA: Diagnosis not present

## 2023-04-02 NOTE — Progress Notes (Signed)
__   Cardiology Office Note   Date:  04/04/2023   ID:  Julie Rice, DOB 04/24/1936, MRN [id number]  PCP:  Shirline Frees, NP  Cardiologist:   Dietrich Pates, MD   Patient presents for follow up of afib      History of Present Illness: Julie Rice is a 87 y.o. female with a history of PAF, SVT(s/p ablation in 2022, CAD (nonobstructive cath in 2020 and on CCTA in Nov 2023), HFimp EF, TIA, COPD (home O2), HL, RA, Esohageal stricture, fibromyalgia.  The pt was seen in April 2024    Since seen she notes her HR is up and down    She denies Cp  Does says she tires out easily, while making bed.    She also notes that she  is unsteady on feet      LIving at home (for 65 years) alone   Does not want to leave  Nurses come in for 30 min at at time, 3x per week   Current Meds  Medication Sig   acetaminophen (TYLENOL) 500 MG tablet Take 1,000 mg by mouth every 6 (six) hours as needed for moderate pain, headache or fever.   albuterol (VENTOLIN HFA) 108 (90 Base) MCG/ACT inhaler INHALE TWO PUFFS BY MOUTH EVERY 6 HOURS AS NEEDED FOR WHEEZING OR SHORTNESS OF BREATH   amiodarone (PACERONE) 200 MG tablet NEW PRESCRIPTION REQUEST: TAKE ONE TABLET BY MOUTH DAILY   buPROPion (WELLBUTRIN XL) 300 MG 24 hr tablet Take 1 tablet (300 mg total) by mouth daily.   Carboxymethylcellulose Sodium (REFRESH TEARS OP) Place 1 drop into both eyes 2 (two) times daily.   cetirizine (ZYRTEC) 10 MG tablet Take 1 tablet (10 mg total) by mouth daily as needed for allergies.   clonazePAM (KLONOPIN) 0.5 MG tablet Take 1 tablet (0.5 mg total) by mouth 2 (two) times daily.   DULoxetine (CYMBALTA) 60 MG capsule Take 1 capsule (60 mg total) by mouth daily.   ELIQUIS 5 MG TABS tablet TAKE 1 TABLET BY MOUTH TWICE A DAY   FeFum-FePoly-FA-B Cmp-C-Biot (FOLIVANE-PLUS) CAPS TAKE ONE CAPSULE BY MOUTH IN THE EARLY MORNING   furosemide (LASIX) 40 MG tablet Take one tablet (40 mg) by mouth every other day alternating with two tablets (80  mg) on the opposite days.   losartan (COZAAR) 50 MG tablet TAKE 1 TABLET BY MOUTH EVERY DAY   metoprolol tartrate (LOPRESSOR) 25 MG tablet Take 0.5 tablets (12.5 mg total) by mouth 2 (two) times daily.   Multiple Vitamins-Minerals (ONE-A-DAY WOMENS 50+) TABS Take 1 tablet by mouth in the morning.   pantoprazole (PROTONIX) 40 MG tablet Take 1 tablet (40 mg total) by mouth 2 (two) times daily.   rosuvastatin (CRESTOR) 20 MG tablet Take 1 tablet (20 mg total) by mouth daily at 6 PM.   sodium chloride (OCEAN) 0.65 % SOLN nasal spray Place 1 spray into both nostrils at bedtime.   spironolactone (ALDACTONE) 25 MG tablet NEW PRESCRIPTION REQUEST: TAKE ONE TABLET BY MOUTH DAILY     Allergies:   Tape, Tramadol, and Morphine   Past Medical History:  Diagnosis Date   A-fib (HCC)    Anemia    Anxiety    Bipolar disorder (HCC)    Blood transfusion 1991   autologous pts own blood given    CAD (coronary artery disease)    Cataract    Chest pain    "@ rest, lying down, w/exertion"   CHF (congestive heart failure) (HCC) 07/2020  Chronic back pain    "mostly lower back but I do have upper back pain regularly" (04/06/2018)   COPD (chronic obstructive pulmonary disease) (HCC)    Depression    Esophageal stricture    Fibromyalgia    GERD (gastroesophageal reflux disease)    Heart murmur    "slight" (04/06/2018)   Hiatal hernia    Hyperlipidemia    Patient denies   Hypertension    Internal hemorrhoids    Lumbago    Mitral regurgitation    Osteoarthritis    Osteoporosis    Pneumonia ~ 01/2018   PONV (postoperative nausea and vomiting)    severe ponv, "in the past" (04/06/2018)   Rectal bleeding    Rheumatoid arthritis (HCC)    Spondylosis    TIA (transient ischemic attack) 2013   Urinary incontinence    wears depends    Past Surgical History:  Procedure Laterality Date   ABDOMINAL HYSTERECTOMY  1982   BACK SURGERY     BALLOON DILATION N/A 09/21/2018   Procedure: BALLOON DILATION;   Surgeon: Hilarie Fredrickson, MD;  Location: [facility] ENDOSCOPY;  Service: Endoscopy;  Laterality: N/A;   BALLOON DILATION N/A 08/12/2019   Procedure: BALLOON DILATION;  Surgeon: Lynann Bologna, MD;  Location: [facility] ENDOSCOPY;  Service: Endoscopy;  Laterality: N/A;   BIOPSY  08/12/2019   Procedure: BIOPSY;  Surgeon: Lynann Bologna, MD;  Location: [facility] ENDOSCOPY;  Service: Endoscopy;;   BLADDER SUSPENSION  1980's   BOTOX INJECTION N/A 09/21/2018   Procedure: BOTOX INJECTION;  Surgeon: Hilarie Fredrickson, MD;  Location: [facility] ENDOSCOPY;  Service: Endoscopy;  Laterality: N/A;   BOTOX INJECTION N/A 08/12/2019   Procedure: BOTOX INJECTION;  Surgeon: Lynann Bologna, MD;  Location: [facility] ENDOSCOPY;  Service: Endoscopy;  Laterality: N/A;   BOTOX INJECTION N/A 09/27/2022   Procedure: BOTOX INJECTION;  Surgeon: Sherrilyn Rist, MD;  Location: [facility] ENDOSCOPY;  Service: Gastroenterology;  Laterality: N/A;   BOTOX INJECTION N/A 02/24/2023   Procedure: BOTOX INJECTION;  Surgeon: Hilarie Fredrickson, MD;  Location: [facility] ENDOSCOPY;  Service: Gastroenterology;  Laterality: N/A;   CATARACT EXTRACTION W/ INTRAOCULAR LENS  IMPLANT, BILATERAL Bilateral 2010   DILATION AND CURETTAGE OF UTERUS  1961   ESOPHAGEAL MANOMETRY N/A 03/25/2018   Procedure: ESOPHAGEAL MANOMETRY (EM);  Surgeon: Napoleon Form, MD;  Location: [facility] ENDOSCOPY;  Service: Endoscopy;  Laterality: N/A;   ESOPHAGOGASTRODUODENOSCOPY (EGD) WITH PROPOFOL N/A 04/07/2018   Procedure: ESOPHAGOGASTRODUODENOSCOPY (EGD) WITH PROPOFOL;  Surgeon: Sherrilyn Rist, MD;  Location: [facility] ENDOSCOPY;  Service: Gastroenterology;  Laterality: N/A;   ESOPHAGOGASTRODUODENOSCOPY (EGD) WITH PROPOFOL N/A 09/21/2018   Procedure: ESOPHAGOGASTRODUODENOSCOPY (EGD) WITH PROPOFOL;  Surgeon: Hilarie Fredrickson, MD;  Location: [facility] ENDOS[facility];  Service: Endoscopy;  Laterality: N/A;   ESOPHAGOGASTRODUODENOSCOPY (EGD) WITH PROPOFOL N/A 08/12/2019   Procedure: ESOPHAGOGASTRODUODENOSCOPY (EGD) WITH PROPOFOL;  Surgeon: Lynann Bologna,  MD;  Location: [facility] ENDOSCOPY;  Service: Endoscopy;  Laterality: N/A;   ESOPHAGOGASTRODUODENOSCOPY (EGD) WITH PROPOFOL N/A 09/27/2022   Procedure: ESOPHAGOGASTRODUODENOSCOPY (EGD) WITH PROPOFOL;  Surgeon: Sherrilyn Rist, MD;  Location: [facility] ENDOSCOPY;  Service: Gastroenterology;  Laterality: N/A;   ESOPHAGOGASTRODUODENOSCOPY (EGD) WITH PROPOFOL N/A 02/24/2023   Procedure: ESOPHAGOGASTRODUODENOSCOPY (EGD) WITH PROPOFOL;  Surgeon: Hilarie Fredrickson, MD;  Location: [facility] ENDOSCOPY;  Service: Gastroenterology;  Laterality: N/A;   HERNIA REPAIR     HIATAL HERNIA REPAIR N/A 08/02/2016   Procedure: LAPAROSCOPIC REPAIR OF LARGE  HIATAL HERNIA;  Surgeon: Glenna Fellows, MD;  Location: [facility] ORS;  Service: General;  Laterality: N/A;   JOINT REPLACEMENT     LAPAROSCOPIC NISSEN FUNDOPLICATION N/A 08/02/2016   Procedure: LAPAROSCOPIC NISSEN FUNDOPLICATION;  Surgeon: Glenna Fellows, MD;  Location: [facility] ORS;  Service: General;  Laterality: N/A;   LUMBAR LAMINECTOMY  1990; 1994; [id number];[id number]   "I've got 2 stainless steel rods; 6 screws; 2 ray cages"took bone from right hip to put in back   RIGHT/LEFT HEART CATH AND CORONARY ANGIOGRAPHY N/A 07/26/2019   Procedure: RIGHT/LEFT HEART CATH AND CORONARY ANGIOGRAPHY;  Surgeon: Lyn Records, MD;  Location: [facility] INVASIVE CV LAB;  Service: Cardiovascular;  Laterality: N/A;   SVT ABLATION N/A 04/25/2021   Procedure: SVT ABLATION;  Surgeon: Regan Lemming, MD;  Location: [facility] INVASIVE CV LAB;  Service: Cardiovascular;  Laterality: N/A;   TEE WITHOUT CARDIOVERSION N/A 08/19/2019   Procedure: TRANSESOPHAGEAL ECHOCARDIOGRAM (TEE);  Surgeon: Lewayne Bunting, MD;  Location: [facility] ENDOSCOPY;  Service: Cardiovascular;  Laterality: N/A;   TONSILLECTOMY AND ADENOIDECTOMY  1945   TOTAL KNEE ARTHROPLASTY Right ~ 1996   TUBAL LIGATION  ~ 1976     Social History:  The patient  reports that she has never smoked. She has never used smokeless tobacco. She reports that she does not drink alcohol  and does not use drugs.   Family History:  The patient's family history includes Heart attack in her brother; Heart disease in her brother; Heart disease (age of onset: 50) in her father; Hypertension in her mother; Kidney disease in her mother; Other in her son; Suicidality in her son.    ROS:  Please see the history of present illness. All other systems are reviewed and  Negative to the above problem except as noted.    PHYSICAL EXAM: VS:  BP 124/60   Pulse (!) 59   Ht 5\' 1"  (1.549 m)   Wt 160 lb (72.6 kg)   SpO2 96%   BMI 30.23 kg/m   GEN: 87 yo  in no acute distress    Examined in  wheelchair  Neck: JVP is normal   Cardiac: RRR   No S3  No murmurs   Tr to 1+ LE edema  Wearing support hose  Respiratory:  clear to auscultation bilaterally No rales or wheezes GI: soft, nontender  No hepatomegaly     EKG:  EKG is not ordered today.    Echo   Feb 2024   1. Left ventricular ejection fraction, by estimation, is 70 to 75%. Left  ventricular ejection fraction by PLAX is 75 %. The left ventricle has  hyperdynamic function. The left ventricle has no regional wall motion  abnormalities. There is mild left  ventricular hypertrophy. Left ventricular diastolic parameters are  consistent with Grade I diastolic dysfunction (impaired relaxation).   2. Right ventricular systolic function is normal. The right ventricular  size is normal. There is normal pulmonary artery systolic pressure. The  estimated right ventricular systolic pressure is 14.6 mmHg.   3. The mitral valve is abnormal. Trivial mitral valve regurgitation.  Moderate to severe mitral annular calcification.   4. The aortic valve is tricuspid. Aortic valve regurgitation is trivial.  Aortic valve sclerosis/calcification is present, without any evidence of  aortic stenosis.   5. The inferior vena cava is normal in size with greater than 50%  respiratory variability, suggesting right atrial pressure of 3 mmHg.   Comparison(s):  Changes from prior study are noted. 10/21/2022: LVEF  60-65%, grade 2 DD.    CT coronary angiogram   Nov 2023  Pulmonary veins drain normally to the left atrium. No LA appendage thrombus.   Calcium Score: 457 Agatston units.   Coronary Arteries: Right dominant with no anomalies   LM: Calcified plaque distal left main, mild (1-24%) stenosis.   LAD system: Ostial calcified plaque, mild (1-24%) stenosis. Mixed plaque proximal LAD, mild (25-49%) stenosis.   Circumflex system: No plaque or stenosis.   RCA system: Difficult to assess due to artifact. By using multiple phases, suspect only mild (1-24%) stenosis with calcified plaque in proximal and mid RCA.   IMPRESSION: 1. Coronary calcium score 457 Agatston units. This places the patient in the 73rd percentile for age and gender, suggesting intermediate risk for future cardiac events.   2. Very difficult images due to artifact, but suspect no more than mild stenosis in the coronary tree.    Lipid Panel    Component Value Date/Time   CHOL 175 09/28/2022 0656   TRIG 126 09/28/2022 0656   HDL 51 09/28/2022 0656   CHOLHDL 3.4 09/28/2022 0656   VLDL 25 09/28/2022 0656   LDLCALC 99 09/28/2022 0656   LDLDIRECT 122.2 06/01/2013 0944      Wt Readings from Last 3 Encounters:  04/04/23 160 lb (72.6 kg)  04/01/23 155 lb 9.6 oz (70.6 kg)  02/24/23 149 lb (67.6 kg)      ASSESSMENT AND PLAN:  1  Hx of HFrEF  LVEF has normalized    Last echo in Feb shows LVEF normal  (70 to 75%) Overall fluid status seems OK   will check BMET today   2  Hx PAF   Recent monitor showed SR with short bursts of sVT   No afib .  Keep on same   3   Hx CAD   Mild nonobstructive CAD by CCTA in Nov 2023    She denies CP     4  Hx SVT  s/p ablation    4  Hx TIA   COntinue Eliquis  5   HL   Pt is on Crestor   LDL in Nov 99   HDL 51   will recheck lipids today  6  Dispo  Pt lives alone   I don't know how she safely does this    I would support  more around the clock care     I will set to see in the fall.          Current medicines are reviewed at length with the patient today.  The patient does not have concerns regarding medicines.  Signed, Dietrich Pates, MD  04/04/2023 3:14 PM    [facility] [address] [place], [place], [place]  [address] Phone: [phone]; Fax: [phone]

## 2023-04-04 ENCOUNTER — Encounter: Payer: Self-pay | Admitting: Internal Medicine

## 2023-04-04 ENCOUNTER — Ambulatory Visit: Payer: Medicare Other | Attending: Internal Medicine | Admitting: Internal Medicine

## 2023-04-04 VITALS — BP 124/60 | HR 59 | Ht 61.0 in | Wt 160.0 lb

## 2023-04-04 DIAGNOSIS — I083 Combined rheumatic disorders of mitral, aortic and tricuspid valves: Secondary | ICD-10-CM | POA: Diagnosis not present

## 2023-04-04 DIAGNOSIS — Z79899 Other long term (current) drug therapy: Secondary | ICD-10-CM | POA: Diagnosis not present

## 2023-04-04 DIAGNOSIS — M159 Polyosteoarthritis, unspecified: Secondary | ICD-10-CM | POA: Diagnosis not present

## 2023-04-04 DIAGNOSIS — J4489 Other specified chronic obstructive pulmonary disease: Secondary | ICD-10-CM | POA: Diagnosis not present

## 2023-04-04 DIAGNOSIS — I6529 Occlusion and stenosis of unspecified carotid artery: Secondary | ICD-10-CM | POA: Diagnosis not present

## 2023-04-04 DIAGNOSIS — I5032 Chronic diastolic (congestive) heart failure: Secondary | ICD-10-CM | POA: Diagnosis not present

## 2023-04-04 DIAGNOSIS — K222 Esophageal obstruction: Secondary | ICD-10-CM | POA: Diagnosis not present

## 2023-04-04 DIAGNOSIS — I429 Cardiomyopathy, unspecified: Secondary | ICD-10-CM | POA: Diagnosis not present

## 2023-04-04 DIAGNOSIS — M0609 Rheumatoid arthritis without rheumatoid factor, multiple sites: Secondary | ICD-10-CM | POA: Diagnosis not present

## 2023-04-04 DIAGNOSIS — I471 Supraventricular tachycardia, unspecified: Secondary | ICD-10-CM | POA: Diagnosis not present

## 2023-04-04 DIAGNOSIS — I110 Hypertensive heart disease with heart failure: Secondary | ICD-10-CM | POA: Diagnosis not present

## 2023-04-04 DIAGNOSIS — I480 Paroxysmal atrial fibrillation: Secondary | ICD-10-CM

## 2023-04-04 DIAGNOSIS — I2510 Atherosclerotic heart disease of native coronary artery without angina pectoris: Secondary | ICD-10-CM | POA: Diagnosis not present

## 2023-04-04 DIAGNOSIS — G609 Hereditary and idiopathic neuropathy, unspecified: Secondary | ICD-10-CM | POA: Diagnosis not present

## 2023-04-04 DIAGNOSIS — I700 Atherosclerosis of aorta: Secondary | ICD-10-CM | POA: Diagnosis not present

## 2023-04-04 DIAGNOSIS — J9621 Acute and chronic respiratory failure with hypoxia: Secondary | ICD-10-CM | POA: Diagnosis not present

## 2023-04-04 DIAGNOSIS — R1310 Dysphagia, unspecified: Secondary | ICD-10-CM | POA: Diagnosis not present

## 2023-04-04 NOTE — Patient Instructions (Addendum)
Medication Instructions:   *If you need a refill on your cardiac medications before your next appointment, please call your pharmacy*   Lab Work: BMET AND LIPID  If you have labs (blood work) drawn today and your tests are completely normal, you will receive your results only by: MyChart Message (if you have MyChart) OR A paper copy in the mail If you have any lab test that is abnormal or we need to change your treatment, we will call you to review the results.   Testing/Procedures:    Follow-Up: At [facility], you and your health needs are our priority.  As part of our continuing mission to provide you with exceptional heart care, we have created designated Provider Care Teams.  These Care Teams include your primary Cardiologist (physician) and Advanced Practice Providers (APPs -  Physician Assistants and Nurse Practitioners) who all work together to provide you with the care you need, when you need it.  We recommend signing up for the patient portal called "MyChart".  Sign up information is provided on this After Visit Summary.  MyChart is used to connect with patients for Virtual Visits (Telemedicine).  Patients are able to view lab/test results, encounter notes, upcoming appointments, etc.  Non-urgent messages can be sent to your provider as well.   To learn more about what you can do with MyChart, go to [email or web address].    Your next appointment:   6 month(s)  Provider:   DR Dietrich Pates     Other Instructions

## 2023-04-05 LAB — LIPID PANEL
Chol/HDL Ratio: 2.5 ratio (ref 0.0–4.4)
Cholesterol, Total: 201 mg/dL — ABNORMAL HIGH (ref 100–199)
HDL: 82 mg/dL (ref >39)
LDL Chol Calc (NIH): 99 mg/dL (ref 0–99)
Triglycerides: 113 mg/dL (ref 0–149)
VLDL Cholesterol Cal: 20 mg/dL (ref 5–40)

## 2023-04-05 LAB — BASIC METABOLIC PANEL
BUN/Creatinine Ratio: 27 (ref 12–28)
BUN: 27 mg/dL (ref 8–27)
CO2: 25 mmol/L (ref 20–29)
Calcium: 9.5 mg/dL (ref 8.7–10.3)
Chloride: 99 mmol/L (ref 96–106)
Creatinine, Ser: 1.01 mg/dL — ABNORMAL HIGH (ref 0.57–1.00)
Glucose: 98 mg/dL (ref 70–99)
Potassium: 4.8 mmol/L (ref 3.5–5.2)
Sodium: 138 mmol/L (ref 134–144)
eGFR: 55 mL/min/{1.73_m2} — ABNORMAL LOW (ref >59)

## 2023-04-08 DIAGNOSIS — I6529 Occlusion and stenosis of unspecified carotid artery: Secondary | ICD-10-CM | POA: Diagnosis not present

## 2023-04-08 DIAGNOSIS — I700 Atherosclerosis of aorta: Secondary | ICD-10-CM | POA: Diagnosis not present

## 2023-04-08 DIAGNOSIS — I471 Supraventricular tachycardia, unspecified: Secondary | ICD-10-CM | POA: Diagnosis not present

## 2023-04-08 DIAGNOSIS — I480 Paroxysmal atrial fibrillation: Secondary | ICD-10-CM | POA: Diagnosis not present

## 2023-04-08 DIAGNOSIS — I110 Hypertensive heart disease with heart failure: Secondary | ICD-10-CM | POA: Diagnosis not present

## 2023-04-08 DIAGNOSIS — I2510 Atherosclerotic heart disease of native coronary artery without angina pectoris: Secondary | ICD-10-CM | POA: Diagnosis not present

## 2023-04-08 DIAGNOSIS — J4489 Other specified chronic obstructive pulmonary disease: Secondary | ICD-10-CM | POA: Diagnosis not present

## 2023-04-08 DIAGNOSIS — I083 Combined rheumatic disorders of mitral, aortic and tricuspid valves: Secondary | ICD-10-CM | POA: Diagnosis not present

## 2023-04-08 DIAGNOSIS — I429 Cardiomyopathy, unspecified: Secondary | ICD-10-CM | POA: Diagnosis not present

## 2023-04-08 DIAGNOSIS — R1310 Dysphagia, unspecified: Secondary | ICD-10-CM | POA: Diagnosis not present

## 2023-04-08 DIAGNOSIS — J9621 Acute and chronic respiratory failure with hypoxia: Secondary | ICD-10-CM | POA: Diagnosis not present

## 2023-04-08 DIAGNOSIS — G609 Hereditary and idiopathic neuropathy, unspecified: Secondary | ICD-10-CM | POA: Diagnosis not present

## 2023-04-08 DIAGNOSIS — M159 Polyosteoarthritis, unspecified: Secondary | ICD-10-CM | POA: Diagnosis not present

## 2023-04-08 DIAGNOSIS — M0609 Rheumatoid arthritis without rheumatoid factor, multiple sites: Secondary | ICD-10-CM | POA: Diagnosis not present

## 2023-04-08 DIAGNOSIS — I5032 Chronic diastolic (congestive) heart failure: Secondary | ICD-10-CM | POA: Diagnosis not present

## 2023-04-08 DIAGNOSIS — K222 Esophageal obstruction: Secondary | ICD-10-CM | POA: Diagnosis not present

## 2023-04-12 ENCOUNTER — Other Ambulatory Visit: Payer: Self-pay | Admitting: Adult Health

## 2023-04-12 DIAGNOSIS — F419 Anxiety disorder, unspecified: Secondary | ICD-10-CM

## 2023-04-15 NOTE — Telephone Encounter (Signed)
Okay for refill?  

## 2023-04-16 ENCOUNTER — Telehealth: Payer: Self-pay | Admitting: Adult Health

## 2023-04-16 NOTE — Telephone Encounter (Addendum)
Ryan with [employer]  Calling to F/U on faxed request regarding office notes, etc...  Faxed on 04/10/23  [phone]

## 2023-04-17 NOTE — Telephone Encounter (Signed)
Faxed received will send to provider for approval or denial.

## 2023-04-18 NOTE — Telephone Encounter (Signed)
  Spoke to pt and she stated that she did want the wheelchair. Pt stated that she feels like they are "pushing the wheelchair on her" and she just wants to continue PT.

## 2023-04-24 NOTE — Telephone Encounter (Signed)
Pt denied wanting this Order. Tried to call Alycia Rossetti multiple times but no answer. Closing note.

## 2023-04-24 NOTE — Telephone Encounter (Signed)
Ryan from [employer] called to check on the status of the fax regarding chart notes. I did not let her know what the pt said to CMA so maybe giving Alycia Rossetti a call to provide info.   [phone]  If not then you all can fax the two most recent chart notes to either the main line or attention to [place].   Main [phone] Attention to Alycia Rossetti [phone]  Please advise.

## 2023-04-29 NOTE — Telephone Encounter (Signed)
Pt call back and stated the nurse that come to see her want her to go ahead an get the wheelchair pt stated you can call her back.

## 2023-04-29 NOTE — Telephone Encounter (Signed)
Alycia Rossetti called and provided fax number Attention to [employer] #[phone]

## 2023-04-30 ENCOUNTER — Telehealth: Payer: Self-pay | Admitting: Adult Health

## 2023-04-30 NOTE — Telephone Encounter (Signed)
Reporting drug interaction spironolactone (ALDACTONE) 25 MG tablet from [place] and losartan (COZAAR) 50 MG tablet by another provider. Requests a call back

## 2023-04-30 NOTE — Telephone Encounter (Signed)
Noted! Pharmacist notified of update

## 2023-05-05 ENCOUNTER — Other Ambulatory Visit: Payer: Self-pay | Admitting: Adult Health

## 2023-05-14 ENCOUNTER — Encounter: Payer: Self-pay | Admitting: Physician Assistant

## 2023-05-14 ENCOUNTER — Ambulatory Visit: Payer: Medicare Other | Admitting: Physician Assistant

## 2023-05-14 VITALS — BP 156/92 | HR 98 | Resp 20 | Ht 61.0 in

## 2023-05-14 DIAGNOSIS — M069 Rheumatoid arthritis, unspecified: Secondary | ICD-10-CM

## 2023-05-14 NOTE — Patient Instructions (Addendum)
Continue follow up with  Hematology  iron deficiency and B12 deficiency  No tylenol, is bad for your liver in large quantities  Continue pain clinic       Reumathology referral for reumathoid arthritis and fibromyalgia  Resume visit with Neurosurgery for lower back pain/ PT  /pain contro Continue Cardiology follow up for congestive hear failure .  Increase the water intake Continue the blood thinner for stroke prevention  Return as needed

## 2023-05-14 NOTE — Progress Notes (Signed)
Neurology clinic note 08/15/2022  ASSESSMENT:  Julie Rice is a 87 y.o. female with a history of hypertension, essential tremor, anemia, rheumatoid arthritis, fibromyalgia nonobstructive coronary artery disease, chronic systolic heart failure, fibromyalgia, chronic back pain with multiple back surgeries, bipolar disorder, hypertension, atrial fibrillation on Eliquis, history of TIA, COPD, history of esophageal strictures, anxiety, depression, COPD, history of embolic left cerebellar infarct November 2023, who presents  in follow up for evaluation of peripheral neuropathy.  Neurological examination remarkable for decreased sensation in all her toes, worse on the right toes, decreased vibration on the great toes and neuropathic pain.  She tried gabapentin in the past, discontinued due to worsening memory, and she is off of Lyrica due to no therapeutic benefit.  Unfortunately, no other options are available at this time for the treatment of neuropathy.  As mentioned before, due to advanced age and multiple medical issues, invasive procedures such as EMG-NCS are not indicated.  Referral to rheumatology is to be done for further evaluation of fibromyalgia-rheumatoid arthritis-abnormal sed rate     Recommendations  Continue B12 replenishment Continue iron replenishment , [facility] the [facility]  Continue follow-up with neurosurgery for chronic lower back pain and pain clinic.  Continue PT.  Stop excessive Tylenol consumption in view of slightly abnormal IgM 40 ( range 49-201, inflammatory liver disease risk, possible coagulation risk in view of her recent nosebleeds and bruising).  Patient currently continues to consume 2000 mg every 4-6 hours for pain.  Due to advanced age and multiple medical issues, invasive procedures such as EMG-NCS are not indicated   Continue Eliquis for secondary stroke prevention/Afib  Referral to rheumatology referral for RA/Fibromyalgia/abnl ESR  Follow up as  needed   Update:  She continues to take Tylenol at 2000 mg q 4-6 hrs  Muscle bulk loss?  no  Muscle pain? "All over".  Cramps/Twitching?   "Sometimes"   Fatigable weakness? Yes  Does strength improve after brief exercise?  Does it daily, at home .  Able to brush hair/teeth without difficulty? Yes   Able to button shirts/use zips? Some discomfort to button  Clumsiness/dropping grasped objects? No Can you arise from squatted position easily? I have to push on the chair   Able to get out of chair without using arms? No   Able to walk up steps easily?:  Uses a walker at home, or R cane to go out    Significant imbalance with walking? Endorsed , especially in the morning I wobble but exercise help Fall?  Had a mechanical fall  on the 4th of July, when trying to go to the commode. She was on the floor for 2 hour                            Any change in urine color, especially after exertion/physical activity? No  Patient lives alone, with nurses coming only week 30 minutes at a time.  The patient denies  diplopia, ptosis, had recent blurred vision, unclear if it was due to embolic cerebellar stroke, not present during this visit  She complains of chronic  dysphagia with meats (GI following)  Denies  poor saliva control, dysarthria/dysphonia only when waking up , impaired mastication, facial weakness/droop. The patient denies  impaired sweating, heat or cold intolerance, excessive mucosal dryness, gastroparetic early satiety, denies constipation She has incontinence and uses pads  She has a history of low blood pressure  The patient has  not  noticed any recent skin rashes nor does she  report any constitutional symptoms like fever, night sweats, anorexia or unintentional weight loss.      HPI 07/2022:   Had neuropathy for 1 year,  and she began taking tylenol and gabapentin. Tylenol currently is at 3 tabs q 6 h and d/c'd gabapentin due to memory issues with the med. She began Lyrica x 1 month  due to dizziness, and has been on Cymbalta for the last  6 mo  Muscle bulk loss?  no  Muscle pain?  sometimes Cramps/Twitching?  not that often "if my potassium is low" Suggestion of myotonia/difficulty relaxing after contraction?  no  Fatigable weakness? no  Does strength improve after brief exercise? she does not exercise  Able to brush hair/teeth without difficulty? Yes   Able to button shirts/use zips? Endorsed   Clumsiness/dropping grasped objects? No Can you arise from squatted position easily? I have to push on the chair   Able to get out of chair without using arms? No   Able to walk up steps easily?: Not really " I have to pick up the leg because it helps  Use an assistive device to walk?  R cane   Significant imbalance with walking? Endorsed , especially in the morning I wobble for 6-8 months   Falls?   3 x w the last year last 4 mo ago" coming out of the  front door and my foot touched the doormat  ".  No other falls during the last 2 weeks.                                 Any change in urine color, especially after exertion/physical activity? no  The patient denies  diplopia, ptosis, She complains of chronic  dysphagia with meats (GI following)  Denies  poor saliva control, dysarthria/dysphonia only when waking up , impaired mastication, facial weakness/droop. The patient denies  impaired sweating, heat or cold intolerance, excessive mucosal dryness, gastroparetic early satiety, denies constipation She has incontinence and uses pads  She has a history of low blood pressure   The patient has not  noticed any recent skin rashes nor does she  report any constitutional symptoms like fever, night sweats, anorexia or unintentional weight loss.   ETOH use: no  Restrictive diet? no  Family history of neuropathy/myopathy/NM disease? no    Current medications being tried for the patient's symptoms include Cymbalta and Lyrica   Prior medications that have been tried: gabapentin but  was getting too forgetful so dc'd 6 months ago   Pertinent labs 07/31/2022: Iron 45, TIBC 448, ferritin 17.4, iron saturation 10 (low), folate 23.9 (normal), CRP normal less than 1, vitamin B12 597 normal, SPEP essentially unremarkable except for serum IgM at 40, sed rate  53 (normal for age), A1c 5.7, ANA negative, IgA 239 normal, IgG normal at 945, MAM 348 elevated, TSH 2.160 (05/2022)     MEDICATIONS:  Outpatient Encounter Medications as of 05/14/2023  Medication Sig   acetaminophen (TYLENOL) 500 MG tablet Take 1,000 mg by mouth every 6 (six) hours as needed for moderate pain, headache or fever.   albuterol (VENTOLIN HFA) 108 (90 Base) MCG/ACT inhaler INHALE TWO PUFFS BY MOUTH EVERY 6 HOURS AS NEEDED FOR WHEEZING OR SHORTNESS OF BREATH   amiodarone (PACERONE) 200 MG tablet NEW PRESCRIPTION REQUEST: TAKE ONE TABLET BY MOUTH DAILY   buPROPion (WELLBUTRIN XL) 300  MG 24 hr tablet Take 1 tablet (300 mg total) by mouth daily.   Carboxymethylcellulose Sodium (REFRESH TEARS OP) Place 1 drop into both eyes 2 (two) times daily.   cetirizine (ZYRTEC) 10 MG tablet TAKE 1 TABLET BY MOUTH EVERY DAY AS NEEDED FOR ALLERGY   clonazePAM (KLONOPIN) 0.5 MG tablet TAKE ONE TABLET BY MOUTH TWICE DAILY   DULoxetine (CYMBALTA) 60 MG capsule Take 1 capsule (60 mg total) by mouth daily.   ELIQUIS 5 MG TABS tablet TAKE 1 TABLET BY MOUTH TWICE A DAY   FeFum-FePoly-FA-B Cmp-C-Biot (FOLIVANE-PLUS) CAPS TAKE ONE CAPSULE BY MOUTH IN THE EARLY MORNING   furosemide (LASIX) 40 MG tablet Take one tablet (40 mg) by mouth every other day alternating with two tablets (80 mg) on the opposite days.   losartan (COZAAR) 50 MG tablet TAKE 1 TABLET BY MOUTH EVERY DAY   metoprolol tartrate (LOPRESSOR) 25 MG tablet Take 0.5 tablets (12.5 mg total) by mouth 2 (two) times daily.   Multiple Vitamins-Minerals (ONE-A-DAY WOMENS 50+) TABS Take 1 tablet by mouth in the morning.   pantoprazole (PROTONIX) 40 MG tablet Take 1 tablet (40 mg total)  by mouth 2 (two) times daily.   rosuvastatin (CRESTOR) 20 MG tablet Take 1 tablet (20 mg total) by mouth daily at 6 PM.   sodium chloride (OCEAN) 0.65 % SOLN nasal spray Place 1 spray into both nostrils at bedtime.   spironolactone (ALDACTONE) 25 MG tablet NEW PRESCRIPTION REQUEST: TAKE ONE TABLET BY MOUTH DAILY   No facility-administered encounter medications on file as of 05/14/2023.    PAST MEDICAL HISTORY: Past Medical History:  Diagnosis Date   A-fib (HCC)    Anemia    Anxiety    Bipolar disorder (HCC)    Blood transfusion 1991   autologous pts own blood given    CAD (coronary artery disease)    Cataract    Chest pain    "@ rest, lying down, w/exertion"   CHF (congestive heart failure) (HCC) 07/2020   Chronic back pain    "mostly lower back but I do have upper back pain regularly" (04/06/2018)   COPD (chronic obstructive pulmonary disease) (HCC)    Depression    Esophageal stricture    Fibromyalgia    GERD (gastroesophageal reflux disease)    Heart murmur    "slight" (04/06/2018)   Hiatal hernia    Hyperlipidemia    Patient denies   Hypertension    Internal hemorrhoids    Lumbago    Mitral regurgitation    Osteoarthritis    Osteoporosis    Pneumonia ~ 01/2018   PONV (postoperative nausea and vomiting)    severe ponv, "in the past" (04/06/2018)   Rectal bleeding    Rheumatoid arthritis (HCC)    Spondylosis    TIA (transient ischemic attack) 2013   Urinary incontinence    wears depends    PAST SURGICAL HISTORY: Past Surgical History:  Procedure Laterality Date   ABDOMINAL HYSTERECTOMY  1982   BACK SURGERY     BALLOON DILATION N/A 09/21/2018   Procedure: BALLOON DILATION;  Surgeon: Hilarie Fredrickson, MD;  Location: [facility] ENDOSCOPY;  Service: Endoscopy;  Laterality: N/A;   BALLOON DILATION N/A 08/12/2019   Procedure: BALLOON DILATION;  Surgeon: Lynann Bologna, MD;  Location: [facility] ENDOSCOPY;  Service: Endoscopy;  Laterality: N/A;   BIOPSY  08/12/2019   Procedure: BIOPSY;   Surgeon: Lynann Bologna, MD;  Location: [facility] ENDOSCOPY;  Service: Endoscopy;;   BLADDER SUSPENSION  [phone]  BOTOX INJECTION N/A 09/21/2018   Procedure: BOTOX INJECTION;  Surgeon: Hilarie Fredrickson, MD;  Location: [facility] ENDOSCOPY;  Service: Endoscopy;  Laterality: N/A;   BOTOX INJECTION N/A 08/12/2019   Procedure: BOTOX INJECTION;  Surgeon: Lynann Bologna, MD;  Location: [facility] ENDOSCOPY;  Service: Endoscopy;  Laterality: N/A;   BOTOX INJECTION N/A 09/27/2022   Procedure: BOTOX INJECTION;  Surgeon: Sherrilyn Rist, MD;  Location: [facility] ENDOSCOPY;  Service: Gastroenterology;  Laterality: N/A;   BOTOX INJECTION N/A 02/24/2023   Procedure: BOTOX INJECTION;  Surgeon: Hilarie Fredrickson, MD;  Location: [facility] ENDOSCOPY;  Service: Gastroenterology;  Laterality: N/A;   CATARACT EXTRACTION W/ INTRAOCULAR LENS  IMPLANT, BILATERAL Bilateral 2010   DILATION AND CURETTAGE OF UTERUS  1961   ESOPHAGEAL MANOMETRY N/A 03/25/2018   Procedure: ESOPHAGEAL MANOMETRY (EM);  Surgeon: Napoleon Form, MD;  Location: [facility] ENDOSCOPY;  Service: Endoscopy;  Laterality: N/A;   ESOPHAGOGASTRODUODENOSCOPY (EGD) WITH PROPOFOL N/A 04/07/2018   Procedure: ESOPHAGOGASTRODUODENOSCOPY (EGD) WITH PROPOFOL;  Surgeon: Sherrilyn Rist, MD;  Location: [facility] ENDOSCOPY;  Service: Gastroenterology;  Laterality: N/A;   ESOPHAGOGASTRODUODENOSCOPY (EGD) WITH PROPOFOL N/A 09/21/2018   Procedure: ESOPHAGOGASTRODUODENOSCOPY (EGD) WITH PROPOFOL;  Surgeon: Hilarie Fredrickson, MD;  Location: [facility] ENDOSCOPY;  Service: Endoscopy;  Laterality: N/A;   ESOPHAGOGASTRODUODENOSCOPY (EGD) WITH PROPOFOL N/A 08/12/2019   Procedure: ESOPHAGOGASTRODUODENOSCOPY (EGD) WITH PROPOFOL;  Surgeon: Lynann Bologna, MD;  Location: [facility] ENDOSCOPY;  Service: Endoscopy;  Laterality: N/A;   ESOPHAGOGASTRODUODENOSCOPY (EGD) WITH PROPOFOL N/A 09/27/2022   Procedure: ESOPHAGOGASTRODUODENOSCOPY (EGD) WITH PROPOFOL;  Surgeon: Sherrilyn Rist, MD;  Location: [facility] ENDOSCOPY;  Service: Gastroenterology;  Laterality:  N/A;   ESOPHAGOGASTRODUODENOSCOPY (EGD) WITH PROPOFOL N/A 02/24/2023   Procedure: ESOPHAGOGASTRODUODENOSCOPY (EGD) WITH PROPOFOL;  Surgeon: Hilarie Fredrickson, MD;  Location: [facility] ENDOSCOPY;  Service: Gastroenterology;  Laterality: N/A;   HERNIA REPAIR     HIATAL HERNIA REPAIR N/A 08/02/2016   Procedure: LAPAROSCOPIC REPAIR OF LARGE  HIATAL HERNIA;  Surgeon: Glenna Fellows, MD;  Location: [facility] ORS;  Service: General;  Laterality: N/A;   JOINT REPLACEMENT     LAPAROSCOPIC NISSEN FUNDOPLICATION N/A 08/02/2016   Procedure: LAPAROSCOPIC NISSEN FUNDOPLICATION;  Surgeon: Glenna Fellows, MD;  Location: [facility] ORS;  Service: General;  Laterality: N/A;   LUMBAR LAMINECTOMY  1990; 1994; [id number]   "I've got 2 stainless steel rods; 6 screws; 2 ray cages"took bone from right hip to put in back   RIGHT/LEFT HEART CATH AND CORONARY ANGIOGRAPHY N/A 07/26/2019   Procedure: RIGHT/LEFT HEART CATH AND CORONARY ANGIOGRAPHY;  Surgeon: Lyn Records, MD;  Location: [facility] INVASIVE CV LAB;  Service: Cardiovascular;  Laterality: N/A;   SVT ABLATION N/A 04/25/2021   Procedure: SVT ABLATION;  Surgeon: Regan Lemming, MD;  Location: [facility] INVASIVE CV LAB;  Service: Cardiovascular;  Laterality: N/A;   TEE WITHOUT CARDIOVERSION N/A 08/19/2019   Procedure: TRANSESOPHAGEAL ECHOCARDIOGRAM (TEE);  Surgeon: Lewayne Bunting, MD;  Location: [facility] ENDOSCOPY;  Service: Cardiovascular;  Laterality: N/A;   TONSILLECTOMY AND ADENOIDECTOMY  1945   TOTAL KNEE ARTHROPLASTY Right ~ 1996   TUBAL LIGATION  ~ 1976    ALLERGIES: Allergies  Allergen Reactions   Tape Other (See Comments)    Band aides, adhesive tape Redness and pulls skin off   Tramadol Other (See Comments)    Pt has prolonged Qtc interval of 540- cannot give tramadol per pharmacy   Morphine Itching and Rash    Flushing    FAMILY HISTORY: Family History  Problem Relation Age of Onset  Kidney disease Mother    Hypertension Mother    Heart disease Father 32       die of MI  at age 75   Heart disease Brother    Heart attack Brother    Suicidality Son    Other Son        MVA   Colon cancer Neg Hx    Esophageal cancer Neg Hx    Pancreatic cancer Neg Hx    Rectal cancer Neg Hx    Stomach cancer Neg Hx     SOCIAL HISTORY: Social History   Tobacco Use   Smoking status: Never   Smokeless tobacco: Never  Vaping Use   Vaping Use: Never used  Substance Use Topics   Alcohol use: Never   Drug use: Never   Social History   Social History Narrative   Retired    Widowed   Right handed   Comes with sister today     OBJECTIVE: PHYSICAL EXAM: BP (!) 156/92   Pulse 98   Resp 20   Ht 5\' 1"  (1.549 m)   SpO2 100%   BMI 30.23 kg/m   General: General appearance: Awake and alert. No distress. Cooperative with exam.  Skin: No obvious rash or jaundice. HEENT: Atraumatic. Anicteric. Lungs: Non-labored breathing on room air  Heart: Regular Abdomen: Soft, non tender. Extremities: No edema. No obvious deformity.  Musculoskeletal: No obvious joint swelling. Psych: Affect appropriate.  Neurological: Mental Status: Alert. Speech fluent.  Cranial Nerves: CNII: Visual fields grossly intact. CNIII, IV, VI: PERRL. No nystagmus. EOMI. CN V: Facial sensation intact bilaterally to fine touch. clench strong. CN VII: Facial muscles symmetric and strong. No ptosis at rest or after sustained upgaze. CN VIII: Hearing grossly intact bilaterally. CN IX: No hypophonia. CN X: Palate elevates symmetrically. CN XI: Full strength shoulder shrug bilaterally. CN XII:No atrophy or fasciculations. No  dysarthria Motor: Tone is normal. No  fasciculations in B extremities. No atrophy. Postural intention tremor, mild     DTRs 1/4 B    Sensation: Pinprick: decreased on both feet Vibration: cannot feel on B great toes, normal rest of toes  Temperature: normal  Proprioception: normal  Coordination: Intact finger-to- nose-finger bilaterally.  Posture somewhat  stable Gait: Able to rise from chair with arms crossed needs to push chair . Normal, narrow-based gait, L knee severe arthritis . Unable to heel toe walk         The impression above as well as the plan as outlined below were extensively discussed with the patient All questions were answered to their satisfaction.    Total time spent reviewing records, interview, history/exam, documentation, and coordination of care on day of encounter:  31 min   Thank you for allowing me to participate in patient's care.  If I can answer any additional questions, I would be pleased to do so.  Marlowe Kays, PA-C    CC: Shirline Frees, NP [address] [place] [address]

## 2023-05-19 ENCOUNTER — Telehealth: Payer: Self-pay | Admitting: Adult Health

## 2023-05-19 ENCOUNTER — Ambulatory Visit: Payer: Medicare Other | Admitting: Physician Assistant

## 2023-05-19 NOTE — Telephone Encounter (Signed)
Julie Rice -  with [employer], called to request chart notes from the 04/29/23 office visit, regarding power wheelchair  [phone]  Fax [phone]  Julie Rice informed NP is OOO on Mondays.

## 2023-05-21 NOTE — Telephone Encounter (Signed)
Pt has already declined this order. Spoke to [place] and she was advised of update. Alycia Rossetti will let the rep know.

## 2023-05-28 NOTE — Progress Notes (Deleted)
Office Visit Note  Patient: Julie Rice             Date of Birth: 09-14-1936           MRN: [id number]             PCP: Shirline Frees, NP Referring: Elwyn Reach Visit Date: 06/06/2023 Occupation: @GUAROCC @  Subjective:    History of Present Illness: Julie Rice is a 87 y.o. female who presents today for a new patient consultation.      Activities of Daily Living:  Patient reports morning stiffness for *** {minute/hour:19697}.   Patient {ACTIONS;DENIES/REPORTS:21021675::"Denies"} nocturnal pain.  Difficulty dressing/grooming: {ACTIONS;DENIES/REPORTS:21021675::"Denies"} Difficulty climbing stairs: {ACTIONS;DENIES/REPORTS:21021675::"Denies"} Difficulty getting out of chair: {ACTIONS;DENIES/REPORTS:21021675::"Denies"} Difficulty using hands for taps, buttons, cutlery, and/or writing: {ACTIONS;DENIES/REPORTS:21021675::"Denies"}  No Rheumatology ROS completed.   PMFS History:  Patient Active Problem List   Diagnosis Date Noted  . Generalized weakness 09/28/2022  . Temporal arteritis (HCC) 09/28/2022  . Arrhythmia 09/22/2022  . Idiopathic peripheral neuropathy 07/31/2022  . Paroxysmal SVT (supraventricular tachycardia) 05/14/2022  . Coronary artery disease involving native coronary artery of native heart without angina pectoris 05/14/2022  . Wide-complex tachycardia 04/03/2021  . Atrial fibrillation (HCC) 10/15/2020  . Chronic pain syndrome 02/16/2020  . Fibromyalgia 02/16/2020  . Myofascial pain 02/16/2020  . Rheumatoid arthritis involving multiple sites (HCC) 02/16/2020  . Pleural effusion, bilateral 09/08/2019  . Secondary cardiomyopathy (HCC)   . Elevated troponin 08/17/2019  . Chronic respiratory failure with hypoxia (HCC) 08/16/2019  . COPD (chronic obstructive pulmonary disease) (HCC) 08/16/2019  . PAF (paroxysmal atrial fibrillation) (HCC) 08/16/2019  . Community acquired pneumonia of left lung 08/07/2019  . Prolonged QT interval 08/07/2019  .  Sepsis (HCC) 08/07/2019  . Acute on chronic respiratory failure with hypoxia (HCC) 08/07/2019  . (HFpEF) heart failure with preserved ejection fraction (HCC) 07/24/2019  . Chest pain   . DOE (dyspnea on exertion)   . Asthma 04/22/2019  . Abnormal CT of the chest 02/23/2019  . Achalasia   . Esophageal dysmotility   . Loss of weight   . Moderate protein-calorie malnutrition (HCC)   . Hematemesis 04/06/2018  . Periesophageal hiatal hernia 08/02/2016  . Dysphagia 05/10/2016  . Esophageal stricture 05/10/2016  . Epigastric pain 05/10/2016  . Generalized anxiety disorder 02/06/2012  . Major depressive disorder, recurrent (HCC) 02/05/2012  . Hypokalemia 11/09/2011  . Nausea and vomiting in adult 11/08/2011  . Dehydration 11/08/2011  . Tremors of nervous system 11/08/2011  . Hypothyroidism 05/10/2008  . Dyslipidemia 05/10/2008  . UNSPECIFIED ANEMIA 05/10/2008  . CAROTID ARTERY DISEASE 05/10/2008  . Transient cerebral ischemia 05/10/2008  . Chronic back pain 05/10/2008  . OSTEOPENIA 05/10/2008  . Dyspnea 05/10/2008  . Upper airway cough syndrome 05/10/2008  . ESOPHAGEAL STRICTURE 01/19/2008  . Essential hypertension 04/29/2007  . Gastroesophageal reflux disease 04/29/2007  . Osteoarthritis 04/29/2007  . SPONDYLOSIS 04/29/2007    Past Medical History:  Diagnosis Date  . A-fib (HCC)   . Anemia   . Anxiety   . Bipolar disorder (HCC)   . Blood transfusion 1991   autologous pts own blood given   . CAD (coronary artery disease)   . Cataract   . Chest pain    "@ rest, lying down, w/exertion"  . CHF (congestive heart failure) (HCC) 07/2020  . Chronic back pain    "mostly lower back but I do have upper back pain regularly" (04/06/2018)  . COPD (chronic obstructive pulmonary disease) (HCC)   . Depression   .  Esophageal stricture   . Fibromyalgia   . GERD (gastroesophageal reflux disease)   . Heart murmur    "slight" (04/06/2018)  . Hiatal hernia   . Hyperlipidemia    Patient  denies  . Hypertension   . Internal hemorrhoids   . Lumbago   . Mitral regurgitation   . Osteoarthritis   . Osteoporosis   . Pneumonia ~ 01/2018  . PONV (postoperative nausea and vomiting)    severe ponv, "in the past" (04/06/2018)  . Rectal bleeding   . Rheumatoid arthritis (HCC)   . Spondylosis   . TIA (transient ischemic attack) 2013  . Urinary incontinence    wears depends    Family History  Problem Relation Age of Onset  . Kidney disease Mother   . Hypertension Mother   . Heart disease Father 59       die of MI at age 64  . Heart disease Brother   . Heart attack Brother   . Suicidality Son   . Other Son        MVA  . Colon cancer Neg Hx   . Esophageal cancer Neg Hx   . Pancreatic cancer Neg Hx   . Rectal cancer Neg Hx   . Stomach cancer Neg Hx    Past Surgical History:  Procedure Laterality Date  . ABDOMINAL HYSTERECTOMY  1982  . BACK SURGERY    . BALLOON DILATION N/A 09/21/2018   Procedure: BALLOON DILATION;  Surgeon: Hilarie Fredrickson, MD;  Location: [facility] ENDOSCOPY;  Service: Endoscopy;  Laterality: N/A;  . BALLOON DILATION N/A 08/12/2019   Procedure: BALLOON DILATION;  Surgeon: Lynann Bologna, MD;  Location: [facility] ENDOSCOPY;  Service: Endoscopy;  Laterality: N/A;  . BIOPSY  08/12/2019   Procedure: BIOPSY;  Surgeon: Lynann Bologna, MD;  Location: [facility] ENDOSCOPY;  Service: Endoscopy;;  . BLADDER SUSPENSION  1980's  . BOTOX INJECTION N/A 09/21/2018   Procedure: BOTOX INJECTION;  Surgeon: Hilarie Fredrickson, MD;  Location: [facility] ENDOSCOPY;  Service: Endoscopy;  Laterality: N/A;  . BOTOX INJECTION N/A 08/12/2019   Procedure: BOTOX INJECTION;  Surgeon: Lynann Bologna, MD;  Location: [facility] ENDOSCOPY;  Service: Endoscopy;  Laterality: N/A;  . BOTOX INJECTION N/A 09/27/2022   Procedure: BOTOX INJECTION;  Surgeon: Sherrilyn Rist, MD;  Location: [facility] ENDOSCOPY;  Service: Gastroenterology;  Laterality: N/A;  . BOTOX INJECTION N/A 02/24/2023   Procedure: BOTOX INJECTION;  Surgeon: Hilarie Fredrickson, MD;   Location: [facility] ENDOSCOPY;  Service: Gastroenterology;  Laterality: N/A;  . CATARACT EXTRACTION W/ INTRAOCULAR LENS  IMPLANT, BILATERAL Bilateral 2010  . DILATION AND CURETTAGE OF UTERUS  1961  . ESOPHAGEAL MANOMETRY N/A 03/25/2018   Procedure: ESOPHAGEAL MANOMETRY (EM);  Surgeon: Napoleon Form, MD;  Location: [facility] ENDOSCOPY;  Service: Endoscopy;  Laterality: N/A;  . ESOPHAGOGASTRODUODENOSCOPY (EGD) WITH PROPOFOL N/A 04/07/2018   Procedure: ESOPHAGOGASTRODUODENOSCOPY (EGD) WITH PROPOFOL;  Surgeon: Sherrilyn Rist, MD;  Location: [facility] ENDOSCOPY;  Service: Gastroenterology;  Laterality: N/A;  . ESOPHAGOGASTRODUODENOSCOPY (EGD) WITH PROPOFOL N/A 09/21/2018   Procedure: ESOPHAGOGASTRODUODENOSCOPY (EGD) WITH PROPOFOL;  Surgeon: Hilarie Fredrickson, MD;  Location: [facility] ENDOSCOPY;  Service: Endoscopy;  Laterality: N/A;  . ESOPHAGOGASTRODUODENOSCOPY (EGD) WITH PROPOFOL N/A 08/12/2019   Procedure: ESOPHAGOGASTRODUODENOSCOPY (EGD) WITH PROPOFOL;  Surgeon: Lynann Bologna, MD;  Location: [facility] ENDOSCOPY;  Service: Endoscopy;  Laterality: N/A;  . ESOPHAGOGASTRODUODENOSCOPY (EGD) WITH PROPOFOL N/A 09/27/2022   Procedure: ESOPHAGOGASTRODUODENOSCOPY (EGD) WITH PROPOFOL;  Surgeon: Sherrilyn Rist, MD;  Location: [facility] ENDOSCOPY;  Service: Gastroenterology;  Laterality: N/A;  . ESOPHAGOGASTRODUODENOSCOPY (EGD) WITH PROPOFOL N/A 02/24/2023   Procedure: ESOPHAGOGASTRODUODENOSCOPY (EGD) WITH PROPOFOL;  Surgeon: Hilarie Fredrickson, MD;  Location: [facility] ENDOSCOPY;  Service: Gastroenterology;  Laterality: N/A;  . HERNIA REPAIR    . HIATAL HERNIA REPAIR N/A 08/02/2016   Procedure: LAPAROSCOPIC REPAIR OF LARGE  HIATAL HERNIA;  Surgeon: Glenna Fellows, MD;  Location: [facility] ORS;  Service: General;  Laterality: N/A;  . JOINT REPLACEMENT    . LAPAROSCOPIC NISSEN FUNDOPLICATION N/A 08/02/2016   Procedure: LAPAROSCOPIC NISSEN FUNDOPLICATION;  Surgeon: Glenna Fellows, MD;  Location: [facility] ORS;  Service: General;  Laterality: N/A;  . LUMBAR LAMINECTOMY   1990; 1994; [id number]   "I've got 2 stainless steel rods; 6 screws; 2 ray cages"took bone from right hip to put in back  . RIGHT/LEFT HEART CATH AND CORONARY ANGIOGRAPHY N/A 07/26/2019   Procedure: RIGHT/LEFT HEART CATH AND CORONARY ANGIOGRAPHY;  Surgeon: Lyn Records, MD;  Location: [facility] INVASIVE CV LAB;  Service: Cardiovascular;  Laterality: N/A;  . SVT ABLATION N/A 04/25/2021   Procedure: SVT ABLATION;  Surgeon: Regan Lemming, MD;  Location: [facility] INVASIVE CV LAB;  Service: Cardiovascular;  Laterality: N/A;  . TEE WITHOUT CARDIOVERSION N/A 08/19/2019   Procedure: TRANSESOPHAGEAL ECHOCARDIOGRAM (TEE);  Surgeon: Lewayne Bunting, MD;  Location: [facility] ENDOSCOPY;  Service: Cardiovascular;  Laterality: N/A;  . TONSILLECTOMY AND ADENOIDECTOMY  1945  . TOTAL KNEE ARTHROPLASTY Right ~ 1996  . TUBAL LIGATION  ~ 30   Social History   Social History Narrative   Retired    Widowed   Right handed   Comes with sister today   Immunization History  Administered Date(s) Administered  . Fluad Quad(high Dose 65+) 07/24/2019, 10/15/2020, 07/30/2022  . Influenza Whole 09/04/2007, 08/16/2008  . Influenza, High Dose Seasonal PF 08/04/2018  . Influenza,inj,Quad PF,6+ Mos 08/17/2013, 09/02/2014  . Pneumococcal Conjugate-13 09/02/2014  . Pneumococcal Polysaccharide-23 06/01/2013  . Td 04/08/2006  . Zoster, Live 04/08/2006     Objective: Vital Signs: There were no vitals taken for this visit.   Physical Exam Vitals and nursing note reviewed.  Constitutional:      Appearance: She is well-developed.  HENT:     Head: Normocephalic and atraumatic.  Eyes:     Conjunctiva/sclera: Conjunctivae normal.  Cardiovascular:     Rate and Rhythm: Normal rate and regular rhythm.     Heart sounds: Normal heart sounds.  Pulmonary:     Effort: Pulmonary effort is normal.     Breath sounds: Normal breath sounds.  Abdominal:     General: Bowel sounds are normal.     Palpations: Abdomen is soft.   Musculoskeletal:     Cervical back: Normal range of motion.  Lymphadenopathy:     Cervical: No cervical adenopathy.  Skin:    General: Skin is warm and dry.     Capillary Refill: Capillary refill takes less than 2 seconds.  Neurological:     Mental Status: She is alert and oriented to person, place, and time.  Psychiatric:        Behavior: Behavior normal.     Musculoskeletal Exam: ***  CDAI Exam: CDAI Score: -- Patient Global: --; Provider Global: -- Swollen: --; Tender: -- Joint Exam 06/06/2023   No joint exam has been documented for this visit   There is currently no information documented on the homunculus. Go to the Rheumatology activity and complete the homunculus joint exam.  Investigation: No additional findings.  Imaging: No results found.  Recent Labs:  Lab Results  Component Value Date   WBC 10.5 01/16/2023   HGB 12.3 01/16/2023   PLT 327.0 01/16/2023   NA 138 04/04/2023   K 4.8 04/04/2023   CL 99 04/04/2023   CO2 25 04/04/2023   GLUCOSE 98 04/04/2023   BUN 27 04/04/2023   CREATININE 1.01 (H) 04/04/2023   BILITOT 0.5 12/31/2022   ALKPHOS 63 12/31/2022   AST 20 12/31/2022   ALT 14 12/31/2022   PROT 6.7 12/31/2022   ALBUMIN 4.0 12/31/2022   CALCIUM 9.5 04/04/2023   GFRAA >60 06/30/2020    Speciality Comments: No specialty comments available.  Procedures:  No procedures performed Allergies: Tape, Tramadol, and Morphine   Assessment / Plan:     Visit Diagnoses: Elevated sed rate - ESR 30 on 09/28/22. CRP WNL. ANA negative on 07/31/22.  Myofascial pain  Fibromyalgia  Temporal arteritis (HCC)  Rheumatoid arthritis involving multiple sites, unspecified whether rheumatoid factor present (HCC)  Idiopathic peripheral neuropathy  Other specified hypothyroidism  Esophageal stricture  Esophageal dysmotility  History of gastroesophageal reflux (GERD)  Pleural effusion, bilateral  Periesophageal hiatal hernia  Chronic respiratory  failure with hypoxia (HCC)  History of COPD  Prolonged QT interval  Generalized anxiety disorder  History of depression  Orders: No orders of the defined types were placed in this encounter.  No orders of the defined types were placed in this encounter.   Face-to-face time spent with patient was *** minutes. Greater than 50% of time was spent in counseling and coordination of care.  Follow-Up Instructions: No follow-ups on file.   Gearldine Bienenstock, PA-C  Note - This record has been created using Dragon software.  Chart creation errors have been sought, but may not always  have been located. Such creation errors do not reflect on  the standard of medical care.

## 2023-06-01 ENCOUNTER — Encounter (HOSPITAL_COMMUNITY): Payer: Self-pay

## 2023-06-01 ENCOUNTER — Emergency Department (HOSPITAL_COMMUNITY): Payer: Medicare Other

## 2023-06-01 ENCOUNTER — Inpatient Hospital Stay (HOSPITAL_COMMUNITY)
Admission: EM | Admit: 2023-06-01 | Discharge: 2023-06-06 | DRG: 872 | Disposition: A | Discharge status: Skilled Nursing Facility | Payer: Medicare Other | Attending: Internal Medicine | Admitting: Internal Medicine

## 2023-06-01 ENCOUNTER — Other Ambulatory Visit: Payer: Self-pay

## 2023-06-01 ENCOUNTER — Inpatient Hospital Stay (HOSPITAL_COMMUNITY): Payer: Medicare Other

## 2023-06-01 DIAGNOSIS — I480 Paroxysmal atrial fibrillation: Secondary | ICD-10-CM | POA: Diagnosis not present

## 2023-06-01 DIAGNOSIS — I6782 Cerebral ischemia: Secondary | ICD-10-CM | POA: Diagnosis not present

## 2023-06-01 DIAGNOSIS — E872 Acidosis, unspecified: Secondary | ICD-10-CM | POA: Diagnosis not present

## 2023-06-01 DIAGNOSIS — M797 Fibromyalgia: Secondary | ICD-10-CM | POA: Diagnosis present

## 2023-06-01 DIAGNOSIS — R000 Tachycardia, unspecified: Secondary | ICD-10-CM | POA: Diagnosis not present

## 2023-06-01 DIAGNOSIS — Z8673 Personal history of transient ischemic attack (TIA), and cerebral infarction without residual deficits: Secondary | ICD-10-CM

## 2023-06-01 DIAGNOSIS — Z7901 Long term (current) use of anticoagulants: Secondary | ICD-10-CM

## 2023-06-01 DIAGNOSIS — Z66 Do not resuscitate: Secondary | ICD-10-CM | POA: Diagnosis present

## 2023-06-01 DIAGNOSIS — I5032 Chronic diastolic (congestive) heart failure: Secondary | ICD-10-CM

## 2023-06-01 DIAGNOSIS — R6520 Severe sepsis without septic shock: Secondary | ICD-10-CM | POA: Diagnosis not present

## 2023-06-01 DIAGNOSIS — R008 Other abnormalities of heart beat: Secondary | ICD-10-CM | POA: Diagnosis not present

## 2023-06-01 DIAGNOSIS — R1310 Dysphagia, unspecified: Secondary | ICD-10-CM | POA: Diagnosis not present

## 2023-06-01 DIAGNOSIS — K5900 Constipation, unspecified: Secondary | ICD-10-CM | POA: Diagnosis present

## 2023-06-01 DIAGNOSIS — I447 Left bundle-branch block, unspecified: Secondary | ICD-10-CM | POA: Diagnosis present

## 2023-06-01 DIAGNOSIS — Z961 Presence of intraocular lens: Secondary | ICD-10-CM | POA: Diagnosis present

## 2023-06-01 DIAGNOSIS — R748 Abnormal levels of other serum enzymes: Secondary | ICD-10-CM | POA: Diagnosis present

## 2023-06-01 DIAGNOSIS — I2510 Atherosclerotic heart disease of native coronary artery without angina pectoris: Secondary | ICD-10-CM | POA: Diagnosis not present

## 2023-06-01 DIAGNOSIS — K921 Melena: Secondary | ICD-10-CM | POA: Diagnosis not present

## 2023-06-01 DIAGNOSIS — A09 Infectious gastroenteritis and colitis, unspecified: Secondary | ICD-10-CM | POA: Diagnosis not present

## 2023-06-01 DIAGNOSIS — J449 Chronic obstructive pulmonary disease, unspecified: Secondary | ICD-10-CM | POA: Diagnosis not present

## 2023-06-01 DIAGNOSIS — I4891 Unspecified atrial fibrillation: Secondary | ICD-10-CM | POA: Diagnosis not present

## 2023-06-01 DIAGNOSIS — R531 Weakness: Secondary | ICD-10-CM | POA: Diagnosis not present

## 2023-06-01 DIAGNOSIS — Z9841 Cataract extraction status, right eye: Secondary | ICD-10-CM

## 2023-06-01 DIAGNOSIS — Z9981 Dependence on supplemental oxygen: Secondary | ICD-10-CM

## 2023-06-01 DIAGNOSIS — D62 Acute posthemorrhagic anemia: Secondary | ICD-10-CM | POA: Diagnosis not present

## 2023-06-01 DIAGNOSIS — N281 Cyst of kidney, acquired: Secondary | ICD-10-CM | POA: Diagnosis not present

## 2023-06-01 DIAGNOSIS — Z90710 Acquired absence of both cervix and uterus: Secondary | ICD-10-CM

## 2023-06-01 DIAGNOSIS — Y92239 Unspecified place in hospital as the place of occurrence of the external cause: Secondary | ICD-10-CM | POA: Diagnosis not present

## 2023-06-01 DIAGNOSIS — R55 Syncope and collapse: Secondary | ICD-10-CM

## 2023-06-01 DIAGNOSIS — I6523 Occlusion and stenosis of bilateral carotid arteries: Secondary | ICD-10-CM | POA: Diagnosis not present

## 2023-06-01 DIAGNOSIS — M069 Rheumatoid arthritis, unspecified: Secondary | ICD-10-CM | POA: Diagnosis present

## 2023-06-01 DIAGNOSIS — R296 Repeated falls: Secondary | ICD-10-CM | POA: Diagnosis present

## 2023-06-01 DIAGNOSIS — A419 Sepsis, unspecified organism: Principal | ICD-10-CM | POA: Diagnosis present

## 2023-06-01 DIAGNOSIS — M545 Low back pain, unspecified: Secondary | ICD-10-CM | POA: Diagnosis present

## 2023-06-01 DIAGNOSIS — R7309 Other abnormal glucose: Secondary | ICD-10-CM

## 2023-06-01 DIAGNOSIS — I110 Hypertensive heart disease with heart failure: Secondary | ICD-10-CM | POA: Diagnosis not present

## 2023-06-01 DIAGNOSIS — E785 Hyperlipidemia, unspecified: Secondary | ICD-10-CM | POA: Diagnosis present

## 2023-06-01 DIAGNOSIS — F419 Anxiety disorder, unspecified: Secondary | ICD-10-CM

## 2023-06-01 DIAGNOSIS — Z7401 Bed confinement status: Secondary | ICD-10-CM | POA: Diagnosis not present

## 2023-06-01 DIAGNOSIS — J9611 Chronic respiratory failure with hypoxia: Secondary | ICD-10-CM

## 2023-06-01 DIAGNOSIS — N179 Acute kidney failure, unspecified: Secondary | ICD-10-CM

## 2023-06-01 DIAGNOSIS — Z79899 Other long term (current) drug therapy: Secondary | ICD-10-CM

## 2023-06-01 DIAGNOSIS — I959 Hypotension, unspecified: Secondary | ICD-10-CM | POA: Diagnosis not present

## 2023-06-01 DIAGNOSIS — Z9842 Cataract extraction status, left eye: Secondary | ICD-10-CM

## 2023-06-01 DIAGNOSIS — K2100 Gastro-esophageal reflux disease with esophagitis, without bleeding: Secondary | ICD-10-CM | POA: Diagnosis present

## 2023-06-01 DIAGNOSIS — G9389 Other specified disorders of brain: Secondary | ICD-10-CM | POA: Diagnosis not present

## 2023-06-01 DIAGNOSIS — E876 Hypokalemia: Secondary | ICD-10-CM | POA: Diagnosis not present

## 2023-06-01 DIAGNOSIS — Z96651 Presence of right artificial knee joint: Secondary | ICD-10-CM | POA: Diagnosis present

## 2023-06-01 DIAGNOSIS — K5730 Diverticulosis of large intestine without perforation or abscess without bleeding: Secondary | ICD-10-CM | POA: Diagnosis not present

## 2023-06-01 DIAGNOSIS — F319 Bipolar disorder, unspecified: Secondary | ICD-10-CM | POA: Diagnosis not present

## 2023-06-01 DIAGNOSIS — T501X5A Adverse effect of loop [high-ceiling] diuretics, initial encounter: Secondary | ICD-10-CM | POA: Diagnosis not present

## 2023-06-01 DIAGNOSIS — Z841 Family history of disorders of kidney and ureter: Secondary | ICD-10-CM

## 2023-06-01 DIAGNOSIS — K529 Noninfective gastroenteritis and colitis, unspecified: Secondary | ICD-10-CM

## 2023-06-01 DIAGNOSIS — I10 Essential (primary) hypertension: Secondary | ICD-10-CM | POA: Diagnosis not present

## 2023-06-01 DIAGNOSIS — M810 Age-related osteoporosis without current pathological fracture: Secondary | ICD-10-CM | POA: Diagnosis present

## 2023-06-01 DIAGNOSIS — R590 Localized enlarged lymph nodes: Secondary | ICD-10-CM | POA: Diagnosis not present

## 2023-06-01 DIAGNOSIS — R7401 Elevation of levels of liver transaminase levels: Secondary | ICD-10-CM | POA: Diagnosis not present

## 2023-06-01 DIAGNOSIS — K625 Hemorrhage of anus and rectum: Secondary | ICD-10-CM

## 2023-06-01 DIAGNOSIS — R109 Unspecified abdominal pain: Secondary | ICD-10-CM | POA: Diagnosis not present

## 2023-06-01 DIAGNOSIS — D649 Anemia, unspecified: Secondary | ICD-10-CM | POA: Diagnosis not present

## 2023-06-01 DIAGNOSIS — G8929 Other chronic pain: Secondary | ICD-10-CM | POA: Diagnosis present

## 2023-06-01 DIAGNOSIS — Z8249 Family history of ischemic heart disease and other diseases of the circulatory system: Secondary | ICD-10-CM

## 2023-06-01 DIAGNOSIS — D7589 Other specified diseases of blood and blood-forming organs: Secondary | ICD-10-CM | POA: Diagnosis present

## 2023-06-01 LAB — HEMOGLOBIN AND HEMATOCRIT, BLOOD
HCT: 39.8 % (ref 36.0–46.0)
HCT: 37.8 % (ref 36.0–46.0)
HCT: 34 % — ABNORMAL LOW (ref 36.0–46.0)
Hemoglobin: 12.7 g/dL (ref 12.0–15.0)
Hemoglobin: 12 g/dL (ref 12.0–15.0)
Hemoglobin: 10.5 g/dL — ABNORMAL LOW (ref 12.0–15.0)

## 2023-06-01 LAB — COMPREHENSIVE METABOLIC PANEL
ALT: 25 U/L (ref 0–44)
AST: 31 U/L (ref 15–41)
Albumin: 3.4 g/dL — ABNORMAL LOW (ref 3.5–5.0)
Alkaline Phosphatase: 179 U/L — ABNORMAL HIGH (ref 38–126)
Anion gap: 14 (ref 5–15)
BUN: 50 mg/dL — ABNORMAL HIGH (ref 8–23)
CO2: 19 mmol/L — ABNORMAL LOW (ref 22–32)
Calcium: 8.2 mg/dL — ABNORMAL LOW (ref 8.9–10.3)
Chloride: 105 mmol/L (ref 98–111)
Creatinine, Ser: 1.92 mg/dL — ABNORMAL HIGH (ref 0.44–1.00)
GFR, Estimated: 25 mL/min — ABNORMAL LOW (ref >60)
Glucose, Bld: 163 mg/dL — ABNORMAL HIGH (ref 70–99)
Potassium: 4.6 mmol/L (ref 3.5–5.1)
Sodium: 138 mmol/L (ref 135–145)
Total Bilirubin: 1.1 mg/dL (ref 0.3–1.2)
Total Protein: 6.2 g/dL — ABNORMAL LOW (ref 6.5–8.1)

## 2023-06-01 LAB — CBC WITH DIFFERENTIAL/PLATELET
Abs Immature Granulocytes: 0.17 10*3/uL — ABNORMAL HIGH (ref 0.00–0.07)
Basophils Absolute: 0.1 10*3/uL (ref 0.0–0.1)
Basophils Relative: 0 %
Eosinophils Absolute: 0 10*3/uL (ref 0.0–0.5)
Eosinophils Relative: 0 %
HCT: 42.3 % (ref 36.0–46.0)
Hemoglobin: 13.5 g/dL (ref 12.0–15.0)
Immature Granulocytes: 1 %
Lymphocytes Relative: 3 %
Lymphs Abs: 0.8 10*3/uL (ref 0.7–4.0)
MCH: 32.3 pg (ref 26.0–34.0)
MCHC: 31.9 g/dL (ref 30.0–36.0)
MCV: 101.2 fL — ABNORMAL HIGH (ref 80.0–100.0)
Monocytes Absolute: 1.1 10*3/uL — ABNORMAL HIGH (ref 0.1–1.0)
Monocytes Relative: 4 %
Neutro Abs: 23.2 10*3/uL — ABNORMAL HIGH (ref 1.7–7.7)
Neutrophils Relative %: 92 %
Platelets: 267 10*3/uL (ref 150–400)
RBC: 4.18 MIL/uL (ref 3.87–5.11)
RDW: 13.5 % (ref 11.5–15.5)
WBC: 25.5 10*3/uL — ABNORMAL HIGH (ref 4.0–10.5)
nRBC: 0 % (ref 0.0–0.2)

## 2023-06-01 LAB — TYPE AND SCREEN
ABO/RH(D): AB POS
Antibody Screen: NEGATIVE

## 2023-06-01 LAB — I-STAT CG4 LACTIC ACID, ED
Lactic Acid, Venous: 4 mmol/L (ref 0.5–1.9)
Lactic Acid, Venous: 1.2 mmol/L (ref 0.5–1.9)

## 2023-06-01 LAB — POC OCCULT BLOOD, ED: Fecal Occult Bld: POSITIVE — AB

## 2023-06-01 LAB — TROPONIN I (HIGH SENSITIVITY)
Troponin I (High Sensitivity): 8 ng/L (ref <18)
Troponin I (High Sensitivity): 9 ng/L (ref <18)

## 2023-06-01 LAB — CK: Total CK: 139 U/L (ref 38–234)

## 2023-06-01 LAB — MRSA NEXT GEN BY PCR, NASAL: MRSA by PCR Next Gen: NOT DETECTED

## 2023-06-01 MED ORDER — PANTOPRAZOLE SODIUM 40 MG IV SOLR
40.0000 mg | Freq: Two times a day (BID) | INTRAVENOUS | Status: DC
  Administered 2023-06-01 – 2023-06-06 (×11): 40 mg via INTRAVENOUS
  Filled 2023-06-01 (×11): qty 10

## 2023-06-01 MED ORDER — METRONIDAZOLE 500 MG/100ML IV SOLN
500.0000 mg | Freq: Two times a day (BID) | INTRAVENOUS | Status: DC
  Administered 2023-06-01 – 2023-06-05 (×9): 500 mg via INTRAVENOUS
  Filled 2023-06-01 (×9): qty 100

## 2023-06-01 MED ORDER — DEXTROSE-SODIUM CHLORIDE 5-0.9 % IV SOLN: INTRAVENOUS | Status: DC

## 2023-06-01 MED ORDER — LINEZOLID 600 MG/300ML IV SOLN
600.0000 mg | Freq: Two times a day (BID) | INTRAVENOUS | Status: DC
  Administered 2023-06-01 – 2023-06-02 (×4): 600 mg via INTRAVENOUS
  Filled 2023-06-01 (×5): qty 300

## 2023-06-01 MED ORDER — PROCHLORPERAZINE EDISYLATE 10 MG/2ML IJ SOLN
5.0000 mg | Freq: Four times a day (QID) | INTRAMUSCULAR | Status: AC | PRN
  Administered 2023-06-01 – 2023-06-03 (×3): 5 mg via INTRAVENOUS
  Filled 2023-06-01 (×3): qty 2

## 2023-06-01 MED ORDER — SODIUM CHLORIDE 0.9 % IV BOLUS
1000.0000 mL | Freq: Once | INTRAVENOUS | Status: AC
  Administered 2023-06-01: 1000 mL via INTRAVENOUS

## 2023-06-01 MED ORDER — ONDANSETRON HCL 4 MG/2ML IJ SOLN: 4.0000 mg | Freq: Four times a day (QID) | INTRAMUSCULAR | Status: DC | PRN

## 2023-06-01 MED ORDER — SODIUM CHLORIDE 0.9 % IV SOLN
2.0000 g | Freq: Every day | INTRAVENOUS | Status: DC
  Administered 2023-06-01 – 2023-06-05 (×5): 2 g via INTRAVENOUS
  Filled 2023-06-01 (×5): qty 20

## 2023-06-01 NOTE — ED Notes (Signed)
Pt having pain at left IV site. Pt had redness and swelling. Medication stopped. Iv removed. Pressure dressing and ice applied.

## 2023-06-01 NOTE — ED Notes (Signed)
New IV access obtained in right arm due to pt's left arm IV appearing infiltrated.  Pt denies pain in left arm but site appears swollen.  Bolus changed to new IV.

## 2023-06-01 NOTE — ED Provider Notes (Signed)
[place] EMERGENCY DEPARTMENT AT [facility] Provider Note   CSN: [id number] Arrival date & time: 06/01/23  0136     History  Chief Complaint  Patient presents with   Hypotension    Julie Rice is a 87 y.o. female.  The history is provided by the patient and the EMS personnel.  She has history of hypertension, atrial fibrillation anticoagulated on apixaban, systolic heart failure, COPD, rheumatoid arthritis, stroke and was brought in by ambulance.  She apparently was stuck on her toilet.  Her estimate was 2 hours but EMS thinks it may have been as long as 5 hours.  Patient thinks she may have passed out on the commode.  She is complaining of hurting everywhere.  EMS noted initial hypotension with blood pressure 70 systolic.  She did get 150 mL of fluid and blood pressure has come up to over 100.  She does endorse some nausea, no change in baseline dyspnea.   Home Medications Prior to Admission medications   Medication Sig Start Date End Date Taking? Authorizing Provider  acetaminophen (TYLENOL) 500 MG tablet Take 1,000 mg by mouth every 6 (six) hours as needed for moderate pain, headache or fever.    [provider]  albuterol (VENTOLIN HFA) 108 (90 Base) MCG/ACT inhaler INHALE TWO PUFFS BY MOUTH EVERY 6 HOURS AS NEEDED FOR WHEEZING OR SHORTNESS OF BREATH 02/26/23   Nafziger, [name], NP  amiodarone (PACERONE) 200 MG tablet NEW PRESCRIPTION REQUEST: TAKE ONE TABLET BY MOUTH DAILY 02/26/23   Nafziger, [name], NP  buPROPion (WELLBUTRIN XL) 300 MG 24 hr tablet Take 1 tablet (300 mg total) by mouth daily. 04/01/23   Nafziger, [name], NP  Carboxymethylcellulose Sodium (REFRESH TEARS OP) Place 1 drop into both eyes 2 (two) times daily.    [provider]  cetirizine (ZYRTEC) 10 MG tablet TAKE 1 TABLET BY MOUTH EVERY DAY AS NEEDED FOR ALLERGY 05/07/23   Nafziger, [name], NP  clonazePAM (KLONOPIN) 0.5 MG tablet TAKE ONE TABLET BY MOUTH TWICE DAILY 04/15/23   Nafziger, [name],  NP  DULoxetine (CYMBALTA) 60 MG capsule Take 1 capsule (60 mg total) by mouth daily. 02/06/23   Nafziger, [name], NP  ELIQUIS 5 MG TABS tablet TAKE 1 TABLET BY MOUTH TWICE A DAY 12/03/22   Camnitz, Andree Coss, MD  FeFum-FePoly-FA-B Cmp-C-Biot [facility]) CAPS TAKE ONE CAPSULE BY MOUTH IN THE EARLY MORNING 03/17/23   Heilingoetter, Cassandra L, PA-C  furosemide (LASIX) 40 MG tablet Take one tablet (40 mg) by mouth every other day alternating with two tablets (80 mg) on the opposite days. 03/12/23   Pricilla Riffle, MD  losartan (COZAAR) 50 MG tablet TAKE 1 TABLET BY MOUTH EVERY DAY 02/07/23   Camnitz, Andree Coss, MD  metoprolol tartrate (LOPRESSOR) 25 MG tablet Take 0.5 tablets (12.5 mg total) by mouth 2 (two) times daily. 02/07/23   Camnitz, Andree Coss, MD  Multiple Vitamins-Minerals (ONE-A-DAY WOMENS 50+) TABS Take 1 tablet by mouth in the morning.    [provider]  pantoprazole (PROTONIX) 40 MG tablet Take 1 tablet (40 mg total) by mouth 2 (two) times daily. 01/16/23   Hilarie Fredrickson, MD  rosuvastatin (CRESTOR) 20 MG tablet Take 1 tablet (20 mg total) by mouth daily at 6 PM. 02/07/23   Nafziger, [name], NP  sodium chloride (OCEAN) 0.65 % SOLN nasal spray Place 1 spray into both nostrils at bedtime.    [provider]  spironolactone (ALDACTONE) 25 MG tablet NEW PRESCRIPTION REQUEST: TAKE ONE  TABLET BY MOUTH DAILY 02/26/23   Shirline Frees, NP      Allergies    Tape, Tramadol, and Morphine    Review of Systems   Review of Systems  All other systems reviewed and are negative.   Physical Exam Updated Vital Signs BP (!) 125/46   Pulse (!) 55   Temp (!) 97.5 F (36.4 C) (Oral)   Resp 19   Ht 5\' 1"  (1.549 m)   Wt 68 kg   SpO2 94%   BMI 28.34 kg/m  Physical Exam Vitals and nursing note reviewed.   87 year old female, resting comfortably and in no acute distress. Vital signs are significant for slightly slow heart rate. Oxygen saturation is 94%, which is normal. Head is  normocephalic and atraumatic. PERRLA, EOMI. Oropharynx is clear. Neck is nontender and supple without adenopathy or JVD. Back is nontender and there is no CVA tenderness. Lungs are clear without rales, wheezes, or rhonchi. Chest is nontender. Heart has regular rate and rhythm without murmur. Abdomen is soft, flat, nontender. Extremities: Feet are cold with some cyanosis with normal temperature starting at the knee.  Capillary refill is delayed to 6 seconds bilaterally.  There is no peripheral edema. Skin is warm and dry without rash. Neurologic: Mental status is normal, cranial nerves are intact, moves all extremities equally.  ED Results / Procedures / Treatments   Labs (all labs ordered are listed, but only abnormal results are displayed) Labs Reviewed  COMPREHENSIVE METABOLIC PANEL - Abnormal; Notable for the following components:      Result Value   CO2 19 (*)    Glucose, Bld 163 (*)    BUN 50 (*)    Creatinine, Ser 1.92 (*)    Calcium 8.2 (*)    Total Protein 6.2 (*)    Albumin 3.4 (*)    Alkaline Phosphatase 179 (*)    GFR, Estimated 25 (*)    All other components within normal limits  CBC WITH DIFFERENTIAL/PLATELET - Abnormal; Notable for the following components:   WBC 25.5 (*)    MCV 101.2 (*)    Neutro Abs 23.2 (*)    Monocytes Absolute 1.1 (*)    Abs Immature Granulocytes 0.17 (*)    All other components within normal limits  I-STAT CG4 LACTIC ACID, ED - Abnormal; Notable for the following components:   Lactic Acid, Venous 4.0 (*)    All other components within normal limits  CK  URINALYSIS, W/ REFLEX TO CULTURE (INFECTION SUSPECTED)  POC OCCULT BLOOD, ED  TROPONIN I (HIGH SENSITIVITY)  TROPONIN I (HIGH SENSITIVITY)    EKG EKG Interpretation Date/Time:  Sunday June 01 2023 01:50:06 EDT Ventricular Rate:  57 PR Interval:  183 QRS Duration:  137 QT Interval:  515 QTC Calculation: 502 R Axis:   -51  Text Interpretation: Sinus rhythm Left bundle branch  block When compared with ECG of 12/08/2022, Left bundle branch block is now present Confirmed by Dione Booze ([id number]) on 06/01/2023 1:51:58 AM  Radiology DG Chest Port 1 View  Result Date: 06/01/2023 CLINICAL DATA:  Syncope EXAM: PORTABLE CHEST 1 VIEW COMPARISON:  None Available. FINDINGS: The heart size and mediastinal contours are within normal limits. Both lungs are clear. The visualized skeletal structures are unremarkable. IMPRESSION: No active disease. Electronically Signed   By: Deatra Robinson M.D.   On: 06/01/2023 02:28    Procedures Procedures  Cardiac monitor shows normal sinus rhythm, per my interpretation.  Medications Ordered in ED Medications  sodium chloride 0.9 % bolus 1,000 mL (1,000 mLs Intravenous New Bag/Given 06/01/23 0349)    ED Course/ Medical Decision Making/ A&P                             Medical Decision Making Amount and/or Complexity of Data Reviewed Labs: ordered. Radiology: ordered.   Patient stuck on commode, possible syncope.  Transient episode of hypotension.  I have reviewed and interpreted her electrocardiogram and my interpretation is left bundle branch block which is new compared with 12/08/2022.  I am ordering workup of probable syncope.  I have ordered CBC, comprehensive metabolic panel, lactic acid level, troponin x 2.  I have also ordered urinalysis and a CT of head without contrast.  Chest x-ray and CT head showed no acute process.  I have independently viewed the images, and agree with radiologist interpretation.  I have reviewed and interpreted his laboratory test, and my interpretation is acute kidney injury with creatinine 1.92 compared with 1.01 on 04/04/2023, elevated alkaline phosphatase of uncertain significance, leukocytosis which is felt to be related to physiologic stress and not occult infection, macrocytosis without anemia, elevated lactic acid level which is felt to be related to circulatory issues and not sepsis, normal troponin.  I have  ordered IV fluids.  Her blood pressure has been stable in the ED.  I have discussed the case with Dr. Antionette Char of [facility] hospitalist, who agrees to admit the patient.  CRITICAL CARE Performed by: Dione Booze Total critical care time: 55 minutes Critical care time was exclusive of separately billable procedures and treating other patients. Critical care was necessary to treat or prevent imminent or life-threatening deterioration. Critical care was time spent personally by me on the following activities: development of treatment plan with patient and/or surrogate as well as nursing, discussions with consultants, evaluation of patient's response to treatment, examination of patient, obtaining history from patient or surrogate, ordering and performing treatments and interventions, ordering and review of laboratory studies, ordering and review of radiographic studies, pulse oximetry and re-evaluation of patient's condition.  Final Clinical Impression(s) / ED Diagnoses Final diagnoses:  Acute kidney injury (nontraumatic) (HCC)  Elevated random blood glucose level  Chronic anticoagulation  Macrocytosis without anemia  Elevated alkaline phosphatase level    Rx / DC Orders ED Discharge Orders     None         Dione Booze, MD 06/01/23 0422

## 2023-06-01 NOTE — ED Notes (Signed)
Pt had large BM (dark red diarrhea). Pt cleaned up with help of NT, linens changed, peri care performed. Preston Fleeting, MD notified of dark red stool.

## 2023-06-01 NOTE — ED Notes (Signed)
Pt actively vomiting, MD notified  

## 2023-06-01 NOTE — H&P (Signed)
History and Physical    Patient: Julie Rice AOZ:[id number] DOB: 1936/09/02 DOA: 06/01/2023 DOS: the patient was seen and examined on 06/01/2023 PCP: Shirline Frees, NP  Patient coming from: Home.  Uses rollator at baseline.  Chief Complaint:  Chief Complaint  Patient presents with   Hypotension   HPI: Julie Rice is a 87 y.o. female with PMH of CAD, HFpEF, PAF/SVT on Eliquis, COPD/chronic hypoxic RF, esophagitis with stricture status post dilation brought to ED by EMS after she was unable to get out of toilet seat.  Per EMS, she was hypotensive with BP down to 70s/50s that has improved after IV fluid.  Scans were called to touch.   Patient reports numbness and tingling in her arms or legs that she attributes to her atrial fibrillation.  She states she had constipation and pain across lower abdomen, and went to the bathroom about 7 PM last night.  She was not able to get up.  She was not sure how long she was on toilet.  Denies fall but not sure about loss of consciousness.  She denies chest pain but hurting all over from fibromyalgia.  She reports lightheadedness.  She denies bleeding until she came to ED.  She denies nausea or vomiting.  She denies dysuria, frequency or urgency.  She denies new medications.  She reports taking Eliquis for A-fib.  Last dose was the morning of 7/27.  She did not take the evening dose.  She denies NSAID use.   Patient reports ambulating with rollator.  She reports frequent falls due to losing balance.  Denies hitting her head.  Patient denies smoking cigarette, drinking alcohol or recreational drug use.  She states she does not want cardiopulmonary resuscitation in an event of sudden cardiopulmonary arrest.   In ED, stable vitals. Cr 1.92.  BUN 50.  Bicarb 19.  AG 14.  LFT within normal.  CK normal.  Serial troponin negative.  Lactic acid 4.0 but improved to 1.2.  WBC 25.5 with left shift.  Hgb 13.5>> 12.7.  EKG with new LBBB.  CT head and CXR without acute  finding.  Patient had a large dark red and loose bowel movements while in ED. Hemoccult was positive.  Started on IV PPI.  Admission accepted overnight for AKI and GI bleed. Review of Systems: As mentioned in the history of present illness. All other systems reviewed and are negative. Past Medical History:  Diagnosis Date   A-fib (HCC)    Anemia    Anxiety    Bipolar disorder (HCC)    Blood transfusion 1991   autologous pts own blood given    CAD (coronary artery disease)    Cataract    Chest pain    "@ rest, lying down, w/exertion"   CHF (congestive heart failure) (HCC) 07/2020   Chronic back pain    "mostly lower back but I do have upper back pain regularly" (04/06/2018)   COPD (chronic obstructive pulmonary disease) (HCC)    Depression    Esophageal stricture    Fibromyalgia    GERD (gastroesophageal reflux disease)    Heart murmur    "slight" (04/06/2018)   Hiatal hernia    Hyperlipidemia    Patient denies   Hypertension    Internal hemorrhoids    Lumbago    Mitral regurgitation    Osteoarthritis    Osteoporosis    Pneumonia ~ 01/2018   PONV (postoperative nausea and vomiting)    severe ponv, "in the past" (04/06/2018)  Rectal bleeding    Rheumatoid arthritis (HCC)    Spondylosis    TIA (transient ischemic attack) 2013   Urinary incontinence    wears depends   Past Surgical History:  Procedure Laterality Date   ABDOMINAL HYSTERECTOMY  1982   BACK SURGERY     BALLOON DILATION N/A 09/21/2018   Procedure: BALLOON DILATION;  Surgeon: Hilarie Fredrickson, MD;  Location: [facility] ENDOSCOPY;  Service: Endoscopy;  Laterality: N/A;   BALLOON DILATION N/A 08/12/2019   Procedure: BALLOON DILATION;  Surgeon: Lynann Bologna, MD;  Location: [facility] ENDOSCOPY;  Service: Endoscopy;  Laterality: N/A;   BIOPSY  08/12/2019   Procedure: BIOPSY;  Surgeon: Lynann Bologna, MD;  Location: [facility] ENDOSCOPY;  Service: Endoscopy;;   BLADDER SUSPENSION  1980's   BOTOX INJECTION N/A 09/21/2018   Procedure: BOTOX  INJECTION;  Surgeon: Hilarie Fredrickson, MD;  Location: [facility] ENDOSCOPY;  Service: Endoscopy;  Laterality: N/A;   BOTOX INJECTION N/A 08/12/2019   Procedure: BOTOX INJECTION;  Surgeon: Lynann Bologna, MD;  Location: [facility] ENDOSCOPY;  Service: Endoscopy;  Laterality: N/A;   BOTOX INJECTION N/A 09/27/2022   Procedure: BOTOX INJECTION;  Surgeon: Sherrilyn Rist, MD;  Location: [facility] ENDOSCOPY;  Service: Gastroenterology;  Laterality: N/A;   BOTOX INJECTION N/A 02/24/2023   Procedure: BOTOX INJECTION;  Surgeon: Hilarie Fredrickson, MD;  Location: [facility] ENDOSCOPY;  Service: Gastroenterology;  Laterality: N/A;   CATARACT EXTRACTION W/ INTRAOCULAR LENS  IMPLANT, BILATERAL Bilateral 2010   DILATION AND CURETTAGE OF UTERUS  1961   ESOPHAGEAL MANOMETRY N/A 03/25/2018   Procedure: ESOPHAGEAL MANOMETRY (EM);  Surgeon: Napoleon Form, MD;  Location: [facility] ENDOSCOPY;  Service: Endoscopy;  Laterality: N/A;   ESOPHAGOGASTRODUODENOSCOPY (EGD) WITH PROPOFOL N/A 04/07/2018   Procedure: ESOPHAGOGASTRODUODENOSCOPY (EGD) WITH PROPOFOL;  Surgeon: Sherrilyn Rist, MD;  Location: [facility] ENDOSCOPY;  Service: Gastroenterology;  Laterality: N/A;   ESOPHAGOGASTRODUODENOSCOPY (EGD) WITH PROPOFOL N/A 09/21/2018   Procedure: ESOPHAGOGASTRODUODENOSCOPY (EGD) WITH PROPOFOL;  Surgeon: Hilarie Fredrickson, MD;  Location: [facility] ENDOSCOPY;  Service: Endoscopy;  Laterality: N/A;   ESOPHAGOGASTRODUODENOSCOPY (EGD) WITH PROPOFOL N/A 08/12/2019   Procedure: ESOPHAGOGASTRODUODENOSCOPY (EGD) WITH PROPOFOL;  Surgeon: Lynann Bologna, MD;  Location: [facility] ENDOSCOPY;  Service: Endoscopy;  Laterality: N/A;   ESOPHAGOGASTRODUODENOSCOPY (EGD) WITH PROPOFOL N/A 09/27/2022   Procedure: ESOPHAGOGASTRODUODENOSCOPY (EGD) WITH PROPOFOL;  Surgeon: Sherrilyn Rist, MD;  Location: [facility] ENDOSCOPY;  Service: Gastroenterology;  Laterality: N/A;   ESOPHAGOGASTRODUODENOSCOPY (EGD) WITH PROPOFOL N/A 02/24/2023   Procedure: ESOPHAGOGASTRODUODENOSCOPY (EGD) WITH PROPOFOL;  Surgeon: Hilarie Fredrickson, MD;   Location: [facility] ENDOSCOPY;  Service: Gastroenterology;  Laterality: N/A;   HERNIA REPAIR     HIATAL HERNIA REPAIR N/A 08/02/2016   Procedure: LAPAROSCOPIC REPAIR OF LARGE  HIATAL HERNIA;  Surgeon: Glenna Fellows, MD;  Location: [facility] ORS;  Service: General;  Laterality: N/A;   JOINT REPLACEMENT     LAPAROSCOPIC NISSEN FUNDOPLICATION N/A 08/02/2016   Procedure: LAPAROSCOPIC NISSEN FUNDOPLICATION;  Surgeon: Glenna Fellows, MD;  Location: [facility] ORS;  Service: General;  Laterality: N/A;   LUMBAR LAMINECTOMY  1990; 1994; [id number]   "I've got 2 stainless steel rods; 6 screws; 2 ray cages"took bone from right hip to put in back   RIGHT/LEFT HEART CATH AND CORONARY ANGIOGRAPHY N/A 07/26/2019   Procedure: RIGHT/LEFT HEART CATH AND CORONARY ANGIOGRAPHY;  Surgeon: Lyn Records, MD;  Location: [facility] INVASIVE CV LAB;  Service: Cardiovascular;  Laterality: N/A;   SVT ABLATION N/A 04/25/2021   Procedure: SVT ABLATION;  Surgeon: Regan Lemming, MD;  Location: [facility] INVASIVE CV LAB;  Service: Cardiovascular;  Laterality: N/A;   TEE WITHOUT CARDIOVERSION N/A 08/19/2019   Procedure: TRANSESOPHAGEAL ECHOCARDIOGRAM (TEE);  Surgeon: Lewayne Bunting, MD;  Location: [facility] ENDOSCOPY;  Service: Cardiovascular;  Laterality: N/A;   TONSILLECTOMY AND ADENOIDECTOMY  1945   TOTAL KNEE ARTHROPLASTY Right ~ 1996   TUBAL LIGATION  ~ 1976   Social History:  reports that she has never smoked. She has never used smokeless tobacco. She reports that she does not drink alcohol and does not use drugs.  Allergies  Allergen Reactions   Tape Other (See Comments)    Band aides, adhesive tape Redness and pulls skin off   Tramadol Other (See Comments)    Pt has prolonged Qtc interval of 540- cannot give tramadol per pharmacy   Morphine Itching and Rash    Flushing    Family History  Problem Relation Age of Onset   Kidney disease Mother    Hypertension Mother    Heart disease Father 104       die of MI at age 54   Heart disease  Brother    Heart attack Brother    Suicidality Son    Other Son        MVA   Colon cancer Neg Hx    Esophageal cancer Neg Hx    Pancreatic cancer Neg Hx    Rectal cancer Neg Hx    Stomach cancer Neg Hx     Prior to Admission medications   Medication Sig Start Date End Date Taking? Authorizing Provider  acetaminophen (TYLENOL) 500 MG tablet Take 1,000 mg by mouth every 6 (six) hours as needed for moderate pain, headache or fever.    [provider]  albuterol (VENTOLIN HFA) 108 (90 Base) MCG/ACT inhaler INHALE TWO PUFFS BY MOUTH EVERY 6 HOURS AS NEEDED FOR WHEEZING OR SHORTNESS OF BREATH 02/26/23   Nafziger, [name], NP  amiodarone (PACERONE) 200 MG tablet NEW PRESCRIPTION REQUEST: TAKE ONE TABLET BY MOUTH DAILY 02/26/23   Nafziger, [name], NP  buPROPion (WELLBUTRIN XL) 300 MG 24 hr tablet Take 1 tablet (300 mg total) by mouth daily. 04/01/23   Nafziger, [name], NP  Carboxymethylcellulose Sodium (REFRESH TEARS OP) Place 1 drop into both eyes 2 (two) times daily.    [provider]  cetirizine (ZYRTEC) 10 MG tablet TAKE 1 TABLET BY MOUTH EVERY DAY AS NEEDED FOR ALLERGY 05/07/23   Nafziger, [name], NP  clonazePAM (KLONOPIN) 0.5 MG tablet TAKE ONE TABLET BY MOUTH TWICE DAILY 04/15/23   Nafziger, [name], NP  DULoxetine (CYMBALTA) 60 MG capsule Take 1 capsule (60 mg total) by mouth daily. 02/06/23   Nafziger, [name], NP  ELIQUIS 5 MG TABS tablet TAKE 1 TABLET BY MOUTH TWICE A DAY 12/03/22   Camnitz, Andree Coss, MD  FeFum-FePoly-FA-B Cmp-C-Biot [facility]) CAPS TAKE ONE CAPSULE BY MOUTH IN THE EARLY MORNING 03/17/23   Heilingoetter, Cassandra L, PA-C  furosemide (LASIX) 40 MG tablet Take one tablet (40 mg) by mouth every other day alternating with two tablets (80 mg) on the opposite days. 03/12/23   Pricilla Riffle, MD  losartan (COZAAR) 50 MG tablet TAKE 1 TABLET BY MOUTH EVERY DAY 02/07/23   Camnitz, Andree Coss, MD  metoprolol tartrate (LOPRESSOR) 25 MG tablet Take 0.5 tablets (12.5 mg total) by  mouth 2 (two) times daily. 02/07/23   Camnitz, Andree Coss, MD  Multiple Vitamins-Minerals (ONE-A-DAY WOMENS 50+) TABS Take 1 tablet by mouth in the morning.    [provider]  pantoprazole (PROTONIX) 40 MG tablet Take 1 tablet (40 mg total) by mouth 2 (two) times daily. 01/16/23   Hilarie Fredrickson, MD  rosuvastatin (CRESTOR) 20 MG tablet Take 1 tablet (20 mg total) by mouth daily at 6 PM. 02/07/23   Nafziger, [name], NP  sodium chloride (OCEAN) 0.65 % SOLN nasal spray Place 1 spray into both nostrils at bedtime.    [provider]  spironolactone (ALDACTONE) 25 MG tablet NEW PRESCRIPTION REQUEST: TAKE ONE TABLET BY MOUTH DAILY 02/26/23   Shirline Frees, NP    Physical Exam: Vitals:   06/01/23 0645 06/01/23 0700 06/01/23 0800 06/01/23 0900  BP: (!) 163/59 (!) 168/62 124/89 (!) 158/87  Pulse: 64 63 68 68  Resp: 14 17 16 15   Temp:      TempSrc:      SpO2: 100% 100% 100% 100%  Weight:      Height:       GENERAL: No apparent distress.  Nontoxic. HEENT: Dry mucous membrane.  Vision and hearing grossly intact.  NECK: Supple.  No apparent JVD.  RESP:  No IWOB.  Fair aeration bilaterally. CVS:  RRR. Heart sounds normal.  ABD/GI/GU: BS+. Abd soft, NTND.  MSK/EXT:   No apparent deformity. Moves extremities.  Trace BLE edema. SKIN: no apparent skin lesion or wound NEURO: Awake and alert. Oriented appropriately.  No apparent focal neuro deficit. PSYCH: Calm. Normal affect.  Data Reviewed: See HPI  Assessment and Plan: Principal Problem:   AKI (acute kidney injury) (HCC) Active Problems:   Rectal bleed   Chronic heart failure with preserved ejection fraction (HFpEF) (HCC)   Chronic respiratory failure with hypoxia (HCC)   COPD (chronic obstructive pulmonary disease) (HCC)   PAF (paroxysmal atrial fibrillation) (HCC)   Rheumatoid arthritis involving multiple sites [facility])   Coronary artery disease involving native coronary artery of native heart without angina pectoris   Lactic  acidosis   Syncope   Possible syncope/hypotension: Hypotensive to 70/50 when EMS arrived.  She has leukocytosis with lactic acidosis.  Report pain across upper abdomen before she went to bathroom yesterday.  She denies UTI symptoms.  No new respiratory symptoms.  She has no chest pain but EKG with new LBBB as well.  Syncope and hypotension could be just iatrogenic as well. -Gentle IV fluid-D5 NS at 60 cc an hour -CT chest, abdomen and pelvis without contrast given AKI -Blood culture.  Follow urinalysis. -Broad-spectrum antibiotics-vancomycin, CTX and Flagyl -Pending cultures. -Orthostatic vitals -Echocardiogram given new LBBB on EKG  AKI: Likely prerenal.  Found to be hypotensive to 70/50 when EMS arrived.  Also on diuretics at home.  Received IV fluid bolus 1 L in ED. Recent Labs    09/29/22 0103 10/22/22 1347 12/07/22 1603 12/13/22 1115 12/31/22 1041 02/21/23 1412 03/10/23 1234 03/21/23 1336 04/04/23 1542 06/01/23 0154  BUN 15 23 13 18 19 14 15 18 27  50*  CREATININE 0.89 0.93 0.97 0.87 0.98 0.74 0.80 0.91 1.01* 1.92*  -Continue gentle IV fluid -Recheck renal function in the morning -Avoid nephrotoxic meds -Strict intake and output -Consider further workup if worse or no improvement  Rectal bleed: Patient denies GI bleed before arrival to ED. She had dark red loose bowel movement while in ED.  Hemodynamically stable.  She is on Eliquis at home.  Last dose the morning of 7/27. -Secure 2 PIV lines -Continue PPI although I doubt upper GI bleed.  Recent EGD without significant finding. -Type and screen.  Verbally consented for  blood transfusion if needed. -Check coag labs. -GI, Dr. Elnoria Howard consulted -Monitor H&H.  Transfuse for Hgb less than 8.0 or hemodynamically significant bleeding -CT angio if profuse bleeding. -Antibiotics as above.  Chronic COPD/chronic hypoxic RF: Stable. -Continue home inhalers after med rec  Chronic HFpEF: Stable.  Appears euvolemic except for trace  edema. -Monitor fluid and respiratory status while on IV fluid  History of CAD/new LBBB: No chest pain but new LBBB on EKG.  Serial troponin negative. -Resume home meds as appropriate after med rec. -Echocardiogram  Paroxysmal A-fib: Currently in sinus rhythm.  On amiodarone, metoprolol and Eliquis at home. -Hold home Eliquis in the setting of GI bleed -Resume amiodarone and metoprolol after med rec -Optimize electrolytes.  Lactic acidosis: Likely in the setting of hypotension.  Low suspicion for infectious process.  Resolved.  Leukocytosis/bandemia -Antibiotics as above -Continue monitoring   Advance Care Planning:   Code Status: DNR discussed with patient.  Consults: Gastroenterology.  Family Communication: None at the side  Severity of Illness: The appropriate patient status for this patient is INPATIENT. Inpatient status is judged to be reasonable and necessary in order to provide the required intensity of service to ensure the patient's safety. The patient's presenting symptoms, physical exam findings, and initial radiographic and laboratory data in the context of their chronic comorbidities is felt to place them at high risk for further clinical deterioration. Furthermore, it is not anticipated that the patient will be medically stable for discharge from the hospital within 2 midnights of admission.   * I certify that at the point of admission it is my clinical judgment that the patient will require inpatient hospital care spanning beyond 2 midnights from the point of admission due to high intensity of service, high risk for further deterioration and high frequency of surveillance required.*  Author: Almon Hercules, MD 06/01/2023 9:51 AM  For on call review [email or web address].

## 2023-06-01 NOTE — ED Triage Notes (Signed)
Pt went to the bathroom sometime before 1900. Stuck on toilet unable to get up until 0100. Feet and legs went numb. Purple legs/feet with >4sec cap refill with pooling in lower extremities on EMS arrival. Ems reports minor EKG changes. 70/50 initial BP. 100/70 by arrival to ED. Alternating between NSR and afib. 97% on RA. Glucose 169. fluids given. Hx CHF. 22G L AC. Skin cold to touch on arrival to ED, color improving but feet still purple.

## 2023-06-02 ENCOUNTER — Inpatient Hospital Stay (HOSPITAL_COMMUNITY): Payer: Medicare Other

## 2023-06-02 DIAGNOSIS — R008 Other abnormalities of heart beat: Secondary | ICD-10-CM | POA: Diagnosis not present

## 2023-06-02 DIAGNOSIS — J449 Chronic obstructive pulmonary disease, unspecified: Secondary | ICD-10-CM | POA: Diagnosis not present

## 2023-06-02 DIAGNOSIS — I5032 Chronic diastolic (congestive) heart failure: Secondary | ICD-10-CM | POA: Diagnosis not present

## 2023-06-02 DIAGNOSIS — N179 Acute kidney failure, unspecified: Secondary | ICD-10-CM | POA: Diagnosis not present

## 2023-06-02 DIAGNOSIS — M069 Rheumatoid arthritis, unspecified: Secondary | ICD-10-CM

## 2023-06-02 DIAGNOSIS — J9611 Chronic respiratory failure with hypoxia: Secondary | ICD-10-CM | POA: Diagnosis not present

## 2023-06-02 LAB — BASIC METABOLIC PANEL
Anion gap: 7 (ref 5–15)
BUN: 38 mg/dL — ABNORMAL HIGH (ref 8–23)
CO2: 26 mmol/L (ref 22–32)
Calcium: 8.3 mg/dL — ABNORMAL LOW (ref 8.9–10.3)
Chloride: 105 mmol/L (ref 98–111)
Creatinine, Ser: 1.14 mg/dL — ABNORMAL HIGH (ref 0.44–1.00)
GFR, Estimated: 47 mL/min — ABNORMAL LOW (ref >60)
Glucose, Bld: 120 mg/dL — ABNORMAL HIGH (ref 70–99)
Potassium: 4.2 mmol/L (ref 3.5–5.1)
Sodium: 138 mmol/L (ref 135–145)

## 2023-06-02 LAB — CBC
HCT: 33 % — ABNORMAL LOW (ref 36.0–46.0)
Hemoglobin: 10.5 g/dL — ABNORMAL LOW (ref 12.0–15.0)
MCH: 31.3 pg (ref 26.0–34.0)
MCHC: 31.8 g/dL (ref 30.0–36.0)
MCV: 98.5 fL (ref 80.0–100.0)
Platelets: 225 10*3/uL (ref 150–400)
RBC: 3.35 MIL/uL — ABNORMAL LOW (ref 3.87–5.11)
RDW: 14 % (ref 11.5–15.5)
WBC: 17.3 10*3/uL — ABNORMAL HIGH (ref 4.0–10.5)
nRBC: 0 % (ref 0.0–0.2)

## 2023-06-02 MED ORDER — POLYVINYL ALCOHOL 1.4 % OP SOLN
1.0000 [drp] | Freq: Two times a day (BID) | OPHTHALMIC | Status: DC
  Administered 2023-06-02 – 2023-06-06 (×9): 1 [drp] via OPHTHALMIC
  Filled 2023-06-02: qty 15

## 2023-06-02 MED ORDER — METOPROLOL SUCCINATE ER 25 MG PO TB24
25.0000 mg | ORAL_TABLET | Freq: Every day | ORAL | Status: DC
  Administered 2023-06-02 – 2023-06-05 (×4): 25 mg via ORAL
  Filled 2023-06-02 (×4): qty 1

## 2023-06-02 MED ORDER — OXYCODONE-ACETAMINOPHEN 5-325 MG PO TABS
1.0000 | ORAL_TABLET | Freq: Four times a day (QID) | ORAL | Status: DC | PRN
  Administered 2023-06-02 – 2023-06-05 (×4): 1 via ORAL
  Filled 2023-06-02 (×4): qty 1

## 2023-06-02 MED ORDER — BUPROPION HCL ER (XL) 150 MG PO TB24
300.0000 mg | ORAL_TABLET | Freq: Every day | ORAL | Status: DC | PRN
  Administered 2023-06-04: 300 mg via ORAL
  Filled 2023-06-02: qty 2

## 2023-06-02 MED ORDER — ADULT MULTIVITAMIN W/MINERALS CH
1.0000 | ORAL_TABLET | Freq: Every morning | ORAL | Status: DC
  Administered 2023-06-03 – 2023-06-06 (×4): 1 via ORAL
  Filled 2023-06-02 (×6): qty 1

## 2023-06-02 MED ORDER — ACETAMINOPHEN 325 MG PO TABS
650.0000 mg | ORAL_TABLET | Freq: Four times a day (QID) | ORAL | Status: DC | PRN
  Administered 2023-06-02: 650 mg via ORAL
  Filled 2023-06-02: qty 2

## 2023-06-02 MED ORDER — ALBUTEROL SULFATE HFA 108 (90 BASE) MCG/ACT IN AERS: 2.0000 | INHALATION_SPRAY | Freq: Four times a day (QID) | RESPIRATORY_TRACT | Status: DC | PRN

## 2023-06-02 MED ORDER — ALBUTEROL SULFATE (2.5 MG/3ML) 0.083% IN NEBU: 2.5000 mg | INHALATION_SOLUTION | Freq: Four times a day (QID) | RESPIRATORY_TRACT | Status: DC | PRN

## 2023-06-02 MED ORDER — METOPROLOL TARTRATE 25 MG PO TABS: 25.0000 mg | ORAL_TABLET | Freq: Every day | ORAL | Status: DC

## 2023-06-02 MED ORDER — AMIODARONE HCL 200 MG PO TABS
200.0000 mg | ORAL_TABLET | Freq: Every day | ORAL | Status: DC
  Administered 2023-06-02 – 2023-06-06 (×5): 200 mg via ORAL
  Filled 2023-06-02 (×6): qty 1

## 2023-06-02 NOTE — Plan of Care (Signed)

## 2023-06-02 NOTE — Progress Notes (Signed)
*  PRELIMINARY RESULTS* Echocardiogram 2D Echocardiogram has been performed.  Julie Rice 06/02/2023, 9:51 AM

## 2023-06-02 NOTE — Evaluation (Signed)
Occupational Therapy Evaluation Patient Details Name: Julie Rice MRN: [id number] DOB: 05/15/1936 Today's Date: 06/02/2023   History of Present Illness Julie Rice is a 87 y.o. female brought to ED by EMS after she was unable to get out of toilet seat. Per EMS, she was hypotensive with BP down to 70s/50s that has improved after IV fluid.with PMH of CAD, HFpEF, PAF/SVT on Eliquis, COPD/chronic hypoxic RF, esophagitis with stricture status post dilation.   Clinical Impression   Pt currently at min to mod assist level overall for selfcare and functional transfers with use of the RW for support.  Oxygen sats at 92% or better on room air throughout with HR increasing up to 112 BPM with activity.  BP in the 120's systolic.  Pt lives alone but only intermittent assist from her daughter-in-law.  Feel pt will benefit from acute care OT to help increase activity tolerance, balance, and ADL independence.  Recommend continued inpatient follow up therapy, <3 hours/day post acute stay.       Recommendations for follow up therapy are one component of a multi-disciplinary discharge planning process, led by the attending physician.  Recommendations may be updated based on patient status, additional functional criteria and insurance authorization.   Assistance Recommended at Discharge Frequent or constant Supervision/Assistance  Patient can return home with the following A little help with walking and/or transfers;A little help with bathing/dressing/bathroom;Assistance with cooking/housework;Assist for transportation;Help with stairs or ramp for entrance    Functional Status Assessment  Patient has had a recent decline in their functional status and demonstrates the ability to make significant improvements in function in a reasonable and predictable amount of time.  Equipment Recommendations  None recommended by OT       Precautions / Restrictions Precautions Precautions: Fall Restrictions Weight  Bearing Restrictions: No      Mobility Bed Mobility Overal bed mobility: Needs Assistance Bed Mobility: Supine to Sit     Supine to sit: Min guard          Transfers Overall transfer level: Needs assistance Equipment used: Rolling walker (2 wheels) Transfers: Sit to/from Stand, Bed to chair/wheelchair/BSC Sit to Stand: Min assist     Step pivot transfers: Min assist     General transfer comment: Min instructional cueing for hand placement with sit to stand from the EOB.      Balance Overall balance assessment: Needs assistance Sitting-balance support: Bilateral upper extremity supported Sitting balance-Leahy Scale: Fair   Postural control: Posterior lean Standing balance support: During functional activity, Reliant on assistive device for balance Standing balance-Leahy Scale: Poor Standing balance comment: Pt needs BUE support for mobility                           ADL either performed or assessed with clinical judgement   ADL Overall ADL's : Needs assistance/impaired Eating/Feeding: Modified independent;Sitting   Grooming: Wash/dry hands;Sitting Grooming Details (indicate cue type and reason): simulated Upper Body Bathing: Supervision/ safety;Sitting Upper Body Bathing Details (indicate cue type and reason): simulated Lower Body Bathing: Moderate assistance;Sit to/from stand Lower Body Bathing Details (indicate cue type and reason): simulated Upper Body Dressing : Minimal assistance;Sitting Upper Body Dressing Details (indicate cue type and reason): simulated Lower Body Dressing: Maximal assistance;Sit to/from stand Lower Body Dressing Details (indicate cue type and reason): to donn gripper socks edge of stretcher Toilet Transfer: Minimal assistance;BSC/3in1;Rolling walker (2 wheels);Stand-pivot Statistician Details (indicate cue type and reason): simulated ToiletingTeacher, music  and Hygiene: Minimal assistance;Sit to/from stand        Functional mobility during ADLs: Minimal assistance;Rolling walker (2 wheels) General ADL Comments: Pt's daughter-in-law present for session.  She reports helping out occassionally but not consistently.  Pt used rollator at home but has had recent history of falls.  Decreased ability to reach either foot for donning her gripper sock from the stretcher.  HR elevating up to 112 BPM with activity.  BP stable as well in the 120's systolically.  Pt lives alone.     Vision Baseline Vision/History: 1 Wears glasses Ability to See in Adequate Light: 0 Adequate Patient Visual Report: No change from baseline Vision Assessment?: No apparent visual deficits     Perception  WFL   Praxis  [facility]    Pertinent Vitals/Pain Pain Assessment Pain Assessment: 0-10 Pain Score: 7  Pain Location: legs, feet, and left arm Pain Intervention(s): Limited activity within patient's tolerance, Monitored during session     Hand Dominance Right   Extremity/Trunk Assessment Upper Extremity Assessment Upper Extremity Assessment: Generalized weakness (Bilateral shoulder flexion 0-90 degrees, all other joints AROM WFLs with arthritic changes noted in the digits with bilateral ulnar deviation.  Strength 3+/5 for shoulders and elbows.  Decreased FM coordination noted with attempt to tying her gown.)   Lower Extremity Assessment Lower Extremity Assessment: Defer to PT evaluation   Cervical / Trunk Assessment Cervical / Trunk Assessment: Kyphotic   Communication Communication Communication: No difficulties   Cognition Arousal/Alertness: Awake/alert Behavior During Therapy: WFL for tasks assessed/performed Overall Cognitive Status: Within Functional Limits for tasks assessed                                                  Home Living Family/patient expects to be discharged to:: Private residence Living Arrangements: Alone Available Help at Discharge: Family;Available PRN/intermittently  (brother and sister in law live in the area but not all the time) Type of Home: House Home Access: Stairs to enter [employer] of Steps: 1 Entrance Stairs-Rails: Left Home Layout: One level     Bathroom Shower/Tub: Chief Strategy Officer: Handicapped height     Home Equipment: Tub bench;Cane - quad;Grab bars - tub/shower;Toilet riser;Rollator (4 wheels)   Additional Comments: wears O2 at night, 2.5L      Prior Functioning/Environment Prior Level of Function : Independent/Modified Independent             Mobility Comments: Uses rollator for mobility ADLs Comments: Sponge bathes in the shower, letting water run at the bottom and not the shower head        OT Problem List: Decreased knowledge of use of DME or AE;Decreased activity tolerance;Impaired balance (sitting and/or standing);Pain;Decreased strength      OT Treatment/Interventions: Self-care/ADL training;Balance training;Therapeutic activities;DME and/or AE instruction;Patient/family education    OT Goals(Current goals can be found in the care plan section) Acute Rehab OT Goals Patient Stated Goal: Pt wants to get her strength back OT Goal Formulation: With patient Time For Goal Achievement: 06/16/23 Potential to Achieve Goals: Good  OT Frequency: Min 1X/week    Co-evaluation              AM-PAC OT "6 Clicks" Daily Activity     Outcome Measure Help from another person eating meals?: None Help from another person taking care of personal grooming?: A Little  Help from another person toileting, which includes using toliet, bedpan, or urinal?: A Little Help from another person bathing (including washing, rinsing, drying)?: A Lot Help from another person to put on and taking off regular upper body clothing?: A Little Help from another person to put on and taking off regular lower body clothing?: A Lot 6 Click Score: 17   End of Session Equipment Utilized During Treatment: Gait  belt;Rolling walker (2 wheels) Nurse Communication: Mobility status  Activity Tolerance: Patient limited by fatigue Patient left: in bed;with family/visitor present  OT Visit Diagnosis: Unsteadiness on feet (R26.81);Other abnormalities of gait and mobility (R26.89);Repeated falls (R29.6);Muscle weakness (generalized) (M62.81);Pain Pain - Right/Left:  (bilateral legs) Pain - part of body: Leg                Time: 5188-4166 OT Time Calculation (min): 46 min Charges:  OT General Charges $OT Visit: 1 Visit OT Evaluation $OT Eval Moderate Complexity: 1 Mod OT Treatments $Self Care/Home Management : 23-37 mins  Perrin Maltese, OTR/L Acute Rehabilitation Services  Office [phone] 06/02/2023

## 2023-06-02 NOTE — Evaluation (Addendum)
Physical Therapy Evaluation Patient Details Name: Julie Rice MRN: [id number] DOB: 31-Jan-1936 Today's Date: 06/02/2023  History of Present Illness  The pt is an 87 yo female presenting 7/28 after getting stuck on toilet for ~5 hours, pt hypotensive upon arrival of EMS. Pt found to have AKI, new LBBB, possible colitis, CT negative for acute abnormality. PMH includes: CAD, HFpEF, PAF/SVT on Eliquis, COPD, anxiety, depression, bipolar disorder, fibromyalgia, HTN, TIA, urinary incontinence, and esophagitis with stricture status post dilation.   Clinical Impression  Pt in bed upon arrival of PT, agreeable to evaluation at this time. Prior to admission the pt was living alone, mobilizing with a rollator and reports increased falls recently. The pt now presents with limitations in functional mobility, strength, endurance, power, and dynamic stability due to above dx, and will continue to benefit from skilled PT to address these deficits. The pt required HOB elevated and increased time to complete bed mobility, as well as minA to safely manage pivot from bed-chair with use of RW. The pt was limited in further progression of gait and activity by onset of urinary incontinence with every stand. Pt also reports fatigue with continued sit-stand and mobility. Will continue to benefit from skilled PT acutely and inpatient rehab <3 hours/day to facilitate return to independence with transfers and household distances as pt from home alone and does not have additional support at home.         If plan is discharge home, recommend the following: A little help with walking and/or transfers;A little help with bathing/dressing/bathroom;Assistance with cooking/housework;Assist for transportation;Help with stairs or ramp for entrance   Can travel by private vehicle   Yes    Equipment Recommendations None recommended by PT  Recommendations for Other Services       Functional Status Assessment Patient has had a  recent decline in their functional status and demonstrates the ability to make significant improvements in function in a reasonable and predictable amount of time.     Precautions / Restrictions Precautions Precautions: Fall Precaution Comments: incontinent of urine in standing Restrictions Weight Bearing Restrictions: No      Mobility  Bed Mobility Overal bed mobility: Needs Assistance Bed Mobility: Supine to Sit     Supine to sit: Min guard, HOB elevated     General bed mobility comments: minG with use of brd rail and HOB elevated    Transfers Overall transfer level: Needs assistance Equipment used: Rolling walker (2 wheels) Transfers: Sit to/from Stand, Bed to chair/wheelchair/BSC Sit to Stand: Min guard   Step pivot transfers: Min assist       General transfer comment: Min instructional cueing for hand placement with sit to stand from the EOB.    Ambulation/Gait               General Gait Details: limited to pivotal steps due to incontinence with each stand     Balance Overall balance assessment: Needs assistance Sitting-balance support: No upper extremity supported Sitting balance-Leahy Scale: Fair     Standing balance support: Bilateral upper extremity supported, During functional activity, Reliant on assistive device for balance Standing balance-Leahy Scale: Poor Standing balance comment: Pt needs BUE support for mobility                             Pertinent Vitals/Pain Pain Assessment Pain Assessment: Faces Faces Pain Scale: Hurts a little bit Pain Location: legs, feet, and left arm Pain Descriptors / Indicators:  Aching, Discomfort Pain Intervention(s): Limited activity within patient's tolerance, Monitored during session, Repositioned    Home Living Family/patient expects to be discharged to:: Private residence Living Arrangements: Alone Available Help at Discharge: Family;Available PRN/intermittently (brother and sister in  law live in the area but not all the time) Type of Home: House Home Access: Stairs to enter Entrance Stairs-Rails: Left Entrance Stairs-Number of Steps: 1   Home Layout: One level Home Equipment: Tub bench;Cane - quad;Grab bars - tub/shower;Toilet riser;Rollator (4 wheels) Additional Comments: wears O2 at night, 2.5L    Prior Function Prior Level of Function : Independent/Modified Independent             Mobility Comments: Uses rollator for mobility ADLs Comments: Sponge bathes in the shower, letting water run at the bottom and not the shower head     Hand Dominance   Dominant Hand: Right    Extremity/Trunk Assessment   Upper Extremity Assessment Upper Extremity Assessment: Defer to OT evaluation    Lower Extremity Assessment Lower Extremity Assessment: Generalized weakness (grosslu 4/5 to MMT. 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 no change from her baseline)    Cervical / Trunk Assessment Cervical / Trunk Assessment: Kyphotic  Communication   Communication: No difficulties  Cognition Arousal/Alertness: Awake/alert Behavior During Therapy: WFL for tasks assessed/performed Overall Cognitive Status: Within Functional Limits for tasks assessed                                          General Comments General comments (skin integrity, edema, etc.): VSS on RA. BP with slight drop from 126 to 108 with transition to sitting, pt reports dizziness throughout        Assessment/Plan    PT Assessment Patient needs continued PT services  PT Problem List Decreased strength;Decreased activity tolerance;Decreased balance;Decreased mobility       PT Treatment Interventions DME instruction;Gait training;Stair training;Functional mobility training;Therapeutic activities;Therapeutic exercise;Balance training    PT Goals (Current goals can be found in the Care Plan section)  Acute Rehab PT Goals Patient Stated Goal: return to independence when  safe PT Goal Formulation: With patient Time For Goal Achievement: 06/16/23 Potential to Achieve Goals: Good    Frequency Min 1X/week     Co-evaluation               AM-PAC PT "6 Clicks" Mobility  Outcome Measure Help needed turning from your back to your side while in a flat bed without using bedrails?: A Little Help needed moving from lying on your back to sitting on the side of a flat bed without using bedrails?: A Little Help needed moving to and from a bed to a chair (including a wheelchair)?: A Little Help needed standing up from a chair using your arms (e.g., wheelchair or bedside chair)?: A Little Help needed to walk in hospital room?: Total (<20 ft) Help needed climbing 3-5 steps with a railing? : Total 6 Click Score: 14    End of Session Equipment Utilized During Treatment: Gait belt Activity Tolerance: Patient tolerated treatment well Patient left: in chair;with call bell/phone within reach;with chair alarm set;with family/visitor present;with nursing/sitter in room Nurse Communication: Mobility status PT Visit Diagnosis: Other abnormalities of gait and mobility (R26.89);Muscle weakness (generalized) (M62.81)    Time: 1610-9604 PT Time Calculation (min) (ACUTE ONLY): 35 min   Charges:   PT Evaluation $PT Eval Low Complexity: 1  Low PT Treatments $Therapeutic Activity: 8-22 mins PT General Charges $$ ACUTE PT VISIT: 1 Visit         Vickki Muff, PT, DPT   Acute Rehabilitation Department Office [phone] Secure Chat Communication Preferred  Ronnie Derby 06/02/2023, 3:15 PM

## 2023-06-02 NOTE — ED Notes (Signed)
ED TO INPATIENT HANDOFF REPORT  ED Nurse Name and Phone #: [phone]  S Name/Age/Gender Julie Rice 87 y.o. female Room/Bed: 036C/036C  Code Status   Code Status: DNR  Home/SNF/Other Home Patient oriented to: self, place, time, and situation Is this baseline? Yes   Triage Complete: Triage complete  Chief Complaint AKI (acute kidney injury) (HCC) [N17.9]  Triage Note Pt went to the bathroom sometime before 1900. Stuck on toilet unable to get up until 0100. Feet and legs went numb. Purple legs/feet with >4sec cap refill with pooling in lower extremities on EMS arrival. Ems reports minor EKG changes. 70/50 initial BP. 100/70 by arrival to ED. Alternating between NSR and afib. 97% on RA. Glucose 169. fluids given. Hx CHF. 22G L AC. Skin cold to touch on arrival to ED, color improving but feet still purple.   Allergies Allergies  Allergen Reactions   Tape Other (See Comments)    Band aides, adhesive tape; Redness and pulls skin off   Tramadol Other (See Comments)    Pt has prolonged Qtc interval of 540- cannot give tramadol per pharmacy   Morphine Itching, Rash and Other (See Comments)    Flushing    Level of Care/Admitting Diagnosis ED Disposition     ED Disposition  Admit   Condition  --   Comment  Hospital Area: [facility] [[id number]]  Level of Care: Progressive [102]  Admit to Progressive based on following criteria: NEPHROLOGY stable condition requiring close monitoring for AKI, requiring Hemodialysis or Peritoneal Dialysis either from expected electrolyte imbalance, acidosis, or fluid overload that can be managed by NIPPV or high flow oxygen.  May admit patient to [facility] or [facility] if equivalent level of care is available:: Yes  Covid Evaluation: Asymptomatic - no recent exposure (last 10 days) testing not required  Diagnosis: AKI (acute kidney injury) [facility]) [[id number]]  Admitting Physician: Briscoe Deutscher [[id number]]  Attending  Physician: Briscoe Deutscher [[id number]]  Certification:: I certify this patient will need inpatient services for at least 2 midnights  Estimated Length of Stay: 3          B Medical/Surgery History Past Medical History:  Diagnosis Date   A-fib (HCC)    Anemia    Anxiety    Bipolar disorder (HCC)    Blood transfusion 1991   autologous pts own blood given    CAD (coronary artery disease)    Cataract    Chest pain    "@ rest, lying down, w/exertion"   CHF (congestive heart failure) (HCC) 07/2020   Chronic back pain    "mostly lower back but I do have upper back pain regularly" (04/06/2018)   COPD (chronic obstructive pulmonary disease) (HCC)    Depression    Esophageal stricture    Fibromyalgia    GERD (gastroesophageal reflux disease)    Heart murmur    "slight" (04/06/2018)   Hiatal hernia    Hyperlipidemia    Patient denies   Hypertension    Internal hemorrhoids    Lumbago    Mitral regurgitation    Osteoarthritis    Osteoporosis    Pneumonia ~ 01/2018   PONV (postoperative nausea and vomiting)    severe ponv, "in the past" (04/06/2018)   Rectal bleeding    Rheumatoid arthritis (HCC)    Spondylosis    TIA (transient ischemic attack) 2013   Urinary incontinence    wears depends   Past Surgical History:  Procedure Laterality Date  ABDOMINAL HYSTERECTOMY  1982   BACK SURGERY     BALLOON DILATION N/A 09/21/2018   Procedure: BALLOON DILATION;  Surgeon: Hilarie Fredrickson, MD;  Location: [facility] ENDOSCOPY;  Service: Endoscopy;  Laterality: N/A;   BALLOON DILATION N/A 08/12/2019   Procedure: BALLOON DILATION;  Surgeon: Lynann Bologna, MD;  Location: [facility] ENDOSCOPY;  Service: Endoscopy;  Laterality: N/A;   BIOPSY  08/12/2019   Procedure: BIOPSY;  Surgeon: Lynann Bologna, MD;  Location: [facility] ENDOSCOPY;  Service: Endoscopy;;   BLADDER SUSPENSION  1980's   BOTOX INJECTION N/A 09/21/2018   Procedure: BOTOX INJECTION;  Surgeon: Hilarie Fredrickson, MD;  Location: [facility] ENDOSCOPY;  Service: Endoscopy;   Laterality: N/A;   BOTOX INJECTION N/A 08/12/2019   Procedure: BOTOX INJECTION;  Surgeon: Lynann Bologna, MD;  Location: [facility] ENDOSCOPY;  Service: Endoscopy;  Laterality: N/A;   BOTOX INJECTION N/A 09/27/2022   Procedure: BOTOX INJECTION;  Surgeon: Sherrilyn Rist, MD;  Location: [facility] ENDOSCOPY;  Service: Gastroenterology;  Laterality: N/A;   BOTOX INJECTION N/A 02/24/2023   Procedure: BOTOX INJECTION;  Surgeon: Hilarie Fredrickson, MD;  Location: [facility] ENDOSCOPY;  Service: Gastroenterology;  Laterality: N/A;   CATARACT EXTRACTION W/ INTRAOCULAR LENS  IMPLANT, BILATERAL Bilateral 2010   DILATION AND CURETTAGE OF UTERUS  1961   ESOPHAGEAL MANOMETRY N/A 03/25/2018   Procedure: ESOPHAGEAL MANOMETRY (EM);  Surgeon: Napoleon Form, MD;  Location: [facility] ENDOSCOPY;  Service: Endoscopy;  Laterality: N/A;   ESOPHAGOGASTRODUODENOSCOPY (EGD) WITH PROPOFOL N/A 04/07/2018   Procedure: ESOPHAGOGASTRODUODENOSCOPY (EGD) WITH PROPOFOL;  Surgeon: Sherrilyn Rist, MD;  Location: [facility] ENDOSCOPY;  Service: Gastroenterology;  Laterality: N/A;   ESOPHAGOGASTRODUODENOSCOPY (EGD) WITH PROPOFOL N/A 09/21/2018   Procedure: ESOPHAGOGASTRODUODENOSCOPY (EGD) WITH PROPOFOL;  Surgeon: Hilarie Fredrickson, MD;  [facility]OSCOPY;  Service: Endoscopy;  Laterality: N/A;   ESOPHAGOGASTRODUODENOSCOPY (EGD) WITH PROPOFOL N/A 08/12/2019   Procedure: ESOPHAGOGASTRODUODENOSCOPY (EGD) WITH PROPOFOL;  Surgeon: Lynann Bologna, MD;  Location: [facility] ENDOSCOPY;  Service: Endoscopy;  Laterality: N/A;   ESOPHAGOGASTRODUODENOSCOPY (EGD) WITH PROPOFOL N/A 09/27/2022   Procedure: ESOPHAGOGASTRODUODENOSCOPY (EGD) WITH PROPOFOL;  Surgeon: Sherrilyn Rist, MD;  Location: [facility] ENDOSCOPY;  Service: Gastroenterology;  Laterality: N/A;   ESOPHAGOGASTRODUODENOSCOPY (EGD) WITH PROPOFOL N/A 02/24/2023   Procedure: ESOPHAGOGASTRODUODENOSCOPY (EGD) WITH PROPOFOL;  Surgeon: Hilarie Fredrickson, MD;  Location[facility] ENDOSCOPY;  Service: Gastroenterology;  Laterality: N/A;   HERNIA REPAIR      HIATAL HERNIA REPAIR N/A 08/02/2016   Procedure: LAPAROSCOPIC REPAIR OF LARGE  HIATAL HERNIA;  Surgeon: Glenna Fellows, MD;  Location: [facility] ORS;  Service: General;  Laterality: N/A;   JOINT REPLACEMENT     LAPAROSCOPIC NISSEN FUNDOPLICATION N/A 08/02/2016   Procedure: LAPAROSCOPIC NISSEN FUNDOPLICATION;  Surgeon: Glenna Fellows, MD;  Location: [facility] ORS;  Service: General;  Laterality: N/A;   LUMBAR LAMINECTOMY  1990; 1994; [id number];9604   "I've got 2 stainless steel rods; 6 screws; 2 ray cages"took bone from right hip to put in back   RIGHT/LEFT HEART CATH AND CORONARY ANGIOGRAPHY N/A 07/26/2019   Procedure: RIGHT/LEFT HEART CATH AND CORONARY ANGIOGRAPHY;  Surgeon: Lyn Records, MD;  Location: [facility] INVASIVE CV LAB;  Service: Cardiovascular;  Laterality: N/A;   SVT ABLATION N/A 04/25/2021   Procedure: SVT ABLATION;  Surgeon: Regan Lemming, MD;  Location: [facility] INVASIVE CV LAB;  Service: Cardiovascular;  Laterality: N/A;   TEE WITHOUT CARDIOVERSION N/A 08/19/2019   Procedure: TRANSESOPHAGEAL ECHOCARDIOGRAM (TEE);  Surgeon: Lewayne Bunting, MD;  Location: [facility] ENDOSCOPY;  Service: Cardiovascular;  Laterality:  N/A;   TONSILLECTOMY AND ADENOIDECTOMY  1945   TOTAL KNEE ARTHROPLASTY Right ~ 1996   TUBAL LIGATION  ~ 1976     A IV Location/Drains/Wounds Patient Lines/Drains/Airways Status     Active Line/Drains/Airways     Name Placement date Placement time Site Days   Peripheral IV 06/01/23 20 G 1.88" Right Antecubital 06/01/23  0539  Antecubital  1   Peripheral IV 06/02/23 22 G Right Forearm 06/02/23  0545  Forearm  less than 1            Intake/Output Last 24 hours  Intake/Output Summary (Last 24 hours) at 06/02/2023 1055 Last data filed at 06/01/2023 2318 Gross per 24 hour  Intake 100 ml  Output --  Net 100 ml    Labs/Imaging Results for orders placed or performed during the hospital encounter of 06/01/23 (from the past 48 hour(s))  Comprehensive metabolic panel     Status:  Abnormal   Collection Time: 06/01/23  1:54 AM  Result Value Ref Range   Sodium 138 135 - 145 mmol/L   Potassium 4.6 3.5 - 5.1 mmol/L   Chloride 105 98 - 111 mmol/L   CO2 19 (L) 22 - 32 mmol/L   Glucose, Bld 163 (H) 70 - 99 mg/dL    Comment: Glucose reference range applies only to samples taken after fasting for at least 8 hours.   BUN 50 (H) 8 - 23 mg/dL   Creatinine, Ser 1.61 (H) 0.44 - 1.00 mg/dL   Calcium 8.2 (L) 8.9 - 10.3 mg/dL   Total Protein 6.2 (L) 6.5 - 8.1 g/dL   Albumin 3.4 (L) 3.5 - 5.0 g/dL   AST 31 15 - 41 U/L   ALT 25 0 - 44 U/L   Alkaline Phosphatase 179 (H) 38 - 126 U/L   Total Bilirubin 1.1 0.3 - 1.2 mg/dL   GFR, Estimated 25 (L) >60 mL/min    Comment: (NOTE) Calculated using the CKD-EPI Creatinine Equation (2021)    Anion gap 14 5 - 15    Comment: Performed at [facility], [address] [place], [place] [address]  CBC with Differential     Status: Abnormal   Collection Time: 06/01/23  1:54 AM  Result Value Ref Range   WBC 25.5 (H) 4.0 - 10.5 K/uL   RBC 4.18 3.87 - 5.11 MIL/uL   Hemoglobin 13.5 12.0 - 15.0 g/dL   HCT 54.0 98.1 - 19.1 %   MCV 101.2 (H) 80.0 - 100.0 fL   MCH 32.3 26.0 - 34.0 pg   MCHC 31.9 30.0 - 36.0 g/dL   RDW 47.8 29.5 - 62.1 %   Platelets 267 150 - 400 K/uL   nRBC 0.0 0.0 - 0.2 %   Neutrophils Relative % 92 %   Neutro Abs 23.2 (H) 1.7 - 7.7 K/uL   Lymphocytes Relative 3 %   Lymphs Abs 0.8 0.7 - 4.0 K/uL   Monocytes Relative 4 %   Monocytes Absolute 1.1 (H) 0.1 - 1.0 K/uL   Eosinophils Relative 0 %   Eosinophils Absolute 0.0 0.0 - 0.5 K/uL   Basophils Relative 0 %   Basophils Absolute 0.1 0.0 - 0.1 K/uL   WBC Morphology MORPHOLOGY UNREMARKABLE    RBC Morphology MORPHOLOGY UNREMARKABLE    Smear Review MORPHOLOGY UNREMARKABLE    Immature Granulocytes 1 %   Abs Immature Granulocytes 0.17 (H) 0.00 - 0.07 K/uL    Comment: Performed at [facility], [address],  [place], [place] [address]  Troponin I (High  Sensitivity)     Status: None   Collection Time: 06/01/23  1:54 AM  Result Value Ref Range   Troponin I (High Sensitivity) 8 <18 ng/L    Comment: (NOTE) Elevated high sensitivity troponin I (hsTnI) values and significant  changes across serial measurements may suggest ACS but many other  chronic and acute conditions are known to elevate hsTnI results.  Refer to the "Links" section for chest pain algorithms and additional  guidance. Performed at [facility], [address], [place], [place] [address]   CK     Status: None   Collection Time: 06/01/23  1:54 AM  Result Value Ref Range   Total CK 139 38 - 234 U/L    Comment: Performed at [facility], [address], [place], [place] [address]  I-Stat Lactic Acid     Status: Abnormal   Collection Time: 06/01/23  2:19 AM  Result Value Ref Range   Lactic Acid, Venous 4.0 (HH) 0.5 - 1.9 mmol/L   Comment NOTIFIED PHYSICIAN   Troponin I (High Sensitivity)     Status: None   Collection Time: 06/01/23  4:25 AM  Result Value Ref Range   Troponin I (High Sensitivity) 9 <18 ng/L    Comment: (NOTE) Elevated high sensitivity troponin I (hsTnI) values and significant  changes across serial measurements may suggest ACS but many other  chronic and acute conditions are known to elevate hsTnI results.  Refer to the "Links" section for chest pain algorithms and additional  guidance. Performed at [facility], [address], [place], [place] [address]   POC occult blood, ED RN will collect     Status: Abnormal   Collection Time: 06/01/23  4:46 AM  Result Value Ref Range   Fecal Occult Bld POSITIVE (A) NEGATIVE  Type and screen [facility]     Status: None   Collection Time: 06/01/23  5:41 AM  Result Value Ref Range   ABO/RH(D) AB POS    Antibody Screen NEG    Sample Expiration      06/04/2023,2359 Performed at [facility], [address] [place], [place] [address]   Hemoglobin and hematocrit,  blood     Status: None   Collection Time: 06/01/23  5:41 AM  Result Value Ref Range   Hemoglobin 12.7 12.0 - 15.0 g/dL   HCT 13.0 86.5 - 78.4 %    Comment: Performed at [facility], [address], [place], [place] [address]  I-Stat CG4 Lactic Acid     Status: None   Collection Time: 06/01/23  6:09 AM  Result Value Ref Range   Lactic Acid, Venous 1.2 0.5 - 1.9 mmol/L  Hemoglobin and hematocrit, blood     Status: None   Collection Time: 06/01/23  8:15 AM  Result Value Ref Range   Hemoglobin 12.0 12.0 - 15.0 g/dL   HCT 52.8 41.3 - 24.4 %    Comment: Performed at [facility], [address] [place], [place] [address]  MRSA Next Gen by PCR, Nasal     Status: None   Collection Time: 06/01/23 10:45 AM   Specimen: Nasal Swab  Result Value Ref Range   MRSA by PCR Next Gen NOT DETECTED NOT DETECTED    Comment: (NOTE) The GeneXpert MRSA Assay (FDA approved for NASAL specimens only), is one component of a comprehensive MRSA colonization surveillance program. It is  not intended to diagnose MRSA infection nor to guide or monitor treatment for MRSA infections. Test performance is not FDA approved in patients less than 83 years old. Performed at [facility], [address], [place], [place] [address]   Hemoglobin and hematocrit, blood     Status: Abnormal   Collection Time: 06/01/23 11:12 PM  Result Value Ref Range   Hemoglobin 10.5 (L) 12.0 - 15.0 g/dL   HCT 60.4 (L) 54.0 - 98.1 %    Comment: Performed at [facility], [address], [place], [place] [address]  CBC     Status: Abnormal   Collection Time: 06/02/23  5:48 AM  Result Value Ref Range   WBC 17.3 (H) 4.0 - 10.5 K/uL   RBC 3.35 (L) 3.87 - 5.11 MIL/uL   Hemoglobin 10.5 (L) 12.0 - 15.0 g/dL   HCT 82.9 (L) 56.2 - 13.0 %   MCV 98.5 80.0 - 100.0 fL   MCH 31.3 26.0 - 34.0 pg   MCHC 31.8 30.0 - 36.0 g/dL   RDW 86.5 78.4 - 69.6 %   Platelets 225 150 - 400 K/uL   nRBC 0.0 0.0 - 0.2 %    Comment:  Performed at [facility], [address] [place], [place] [address]  Basic metabolic panel     Status: Abnormal   Collection Time: 06/02/23  5:48 AM  Result Value Ref Range   Sodium 138 135 - 145 mmol/L   Potassium 4.2 3.5 - 5.1 mmol/L   Chloride 105 98 - 111 mmol/L   CO2 26 22 - 32 mmol/L   Glucose, Bld 120 (H) 70 - 99 mg/dL    Comment: Glucose reference range applies only to samples taken after fasting for at least 8 hours.   BUN 38 (H) 8 - 23 mg/dL   Creatinine, Ser 4.13 (H) 0.44 - 1.00 mg/dL   Calcium 8.3 (L) 8.9 - 10.3 mg/dL   GFR, Estimated 47 (L) >60 mL/min    Comment: (NOTE) Calculated using the CKD-EPI Creatinine Equation (2021)    Anion gap 7 5 - 15    Comment: Performed at [facility], [address] [place], [place] [address]  Hemoglobin and hematocrit, blood     Status: Abnormal   Collection Time: 06/02/23  5:50 AM  Result Value Ref Range   Hemoglobin 10.4 (L) 12.0 - 15.0 g/dL   HCT 02.7 (L) 25.3 - 66.4 %    Comment: Performed at [facility], [address], [place], [place] [address]  Protime-INR     Status: Abnormal   Collection Time: 06/02/23  5:50 AM  Result Value Ref Range   Prothrombin Time 16.7 (H) 11.4 - 15.2 seconds   INR 1.3 (H) 0.8 - 1.2    Comment: (NOTE) INR goal varies based on device and disease states. Performed at [facility], [address], [place], [place] [address]   APTT     Status: None   Collection Time: 06/02/23  5:50 AM  Result Value Ref Range   aPTT 30 24 - 36 seconds    Comment: Performed at [facility], [address], [place], [place] [address]  Magnesium     Status: None   Collection Time: 06/02/23  5:50 AM  Result Value Ref Range   Magnesium 2.4 1.7 - 2.4 mg/dL    Comment: Performed at [facility], [address], [place], [place] [address]   *  Note: Due to a large number of results and/or encounters for the requested time period, some results have not been displayed. A complete set  of results can be found in Results Review.   ECHOCARDIOGRAM COMPLETE  Result Date: 06/02/2023    ECHOCARDIOGRAM REPORT   Patient Name:   Julie Rice Date of Exam: 06/02/2023 Medical Rec #:  [id number]        Height:       61.0 in Accession #:    [id number]       Weight:       150.0 lb Date of Birth:  19-Feb-1936         BSA:          1.671 m Patient Age:    87 years         BP:           130/59 mmHg Patient Gender: F                HR:           85 bpm. Exam Location:  Inpatient Procedure: 2D Echo, Cardiac Doppler and Color Doppler Indications:    Other abnormalities of the heart R00.8                 CAD  History:        Patient has prior history of Echocardiogram examinations, most                 recent 12/08/2022. CAD, Carotid Disease and COPD; Risk                 Factors:Non-Smoker and Dyslipidemia.  Sonographer:    Dondra Prader RVT RCS Referring Phys: Boyce Medici GONFA IMPRESSIONS  1. Left ventricular ejection fraction, by estimation, is 55 to 60%. The left ventricle has normal function. The left ventricle has no regional wall motion abnormalities. There is mild concentric left ventricular hypertrophy. Left ventricular diastolic parameters are consistent with Grade I diastolic dysfunction (impaired relaxation).  2. Right ventricular systolic function is normal. The right ventricular size is mildly enlarged. There is mildly elevated pulmonary artery systolic pressure. The estimated right ventricular systolic pressure is 44.5 mmHg.  3. Left atrial size was moderately dilated.  4. Right atrial size was moderately dilated.  5. The mitral valve is normal in structure. Mild mitral valve regurgitation. No evidence of mitral stenosis. Moderate mitral annular calcification.  6. The tricuspid valve is abnormal. Tricuspid valve regurgitation is moderate.  7. The aortic valve is tricuspid. There is mild calcification of the aortic valve. Aortic valve regurgitation is mild. No aortic stenosis is present.  8. The inferior  vena cava is normal in size with greater than 50% respiratory variability, suggesting right atrial pressure of 3 mmHg. FINDINGS  Left Ventricle: Left ventricular ejection fraction, by estimation, is 55 to 60%. The left ventricle has normal function. The left ventricle has no regional wall motion abnormalities. The left ventricular internal cavity size was normal in size. There is  mild concentric left ventricular hypertrophy. Left ventricular diastolic parameters are consistent with Grade I diastolic dysfunction (impaired relaxation). Right Ventricle: The right ventricular size is mildly enlarged. No increase in right ventricular wall thickness. Right ventricular systolic function is normal. There is mildly elevated pulmonary artery systolic pressure. The tricuspid regurgitant velocity is 3.22 m/s, and with an assumed right atrial pressure of 3 mmHg, the estimated right ventricular systolic pressure is 44.5 mmHg. Left Atrium: Left atrial size was moderately  dilated. Right Atrium: Right atrial size was moderately dilated. Pericardium: There is no evidence of pericardial effusion. Mitral Valve: The mitral valve is normal in structure. There is mild calcification of the mitral valve leaflet(s). Moderate mitral annular calcification. Mild mitral valve regurgitation. No evidence of mitral valve stenosis. Tricuspid Valve: The tricuspid valve is abnormal. Tricuspid valve regurgitation is moderate. Aortic Valve: The aortic valve is tricuspid. There is mild calcification of the aortic valve. Aortic valve regurgitation is mild. Aortic regurgitation PHT measures 624 msec. No aortic stenosis is present. Aortic valve mean gradient measures 5.0 mmHg. Aortic valve peak gradient measures 9.5 mmHg. Aortic valve area, by VTI measures 2.09 cm. Pulmonic Valve: The pulmonic valve was normal in structure. Pulmonic valve regurgitation is not visualized. Aorta: The aortic root is normal in size and structure. Venous: The inferior vena  cava is normal in size with greater than 50% respiratory variability, suggesting right atrial pressure of 3 mmHg. IAS/Shunts: No atrial level shunt detected by color flow Doppler.  LEFT VENTRICLE PLAX 2D LVIDd:         3.85 cm   Diastology LVIDs:         2.35 cm   LV e' medial:    6.00 cm/s LV PW:         1.15 cm   LV E/e' medial:  12.1 LV IVS:        1.25 cm   LV e' lateral:   4.60 cm/s LVOT diam:     1.70 cm   LV E/e' lateral: 15.8 LV SV:         57 LV SV Index:   34 LVOT Area:     2.27 cm  RIGHT VENTRICLE             IVC RV Basal diam:  5.00 cm     IVC diam: 1.20 cm RV S prime:     19.30 cm/s TAPSE (M-mode): 2.9 cm LEFT ATRIUM             Index        RIGHT ATRIUM           Index LA diam:        3.70 cm 2.21 cm/m   RA Area:     22.50 cm LA Vol (A2C):   82.0 ml 49.06 ml/m  RA Volume:   56.30 ml  33.68 ml/m LA Vol (A4C):   52.9 ml 31.65 ml/m LA Biplane Vol: 67.0 ml 40.09 ml/m  AORTIC VALVE                     PULMONIC VALVE AV Area (Vmax):    2.08 cm      PV Vmax:          1.12 m/s AV Area (Vmean):   2.12 cm      PV Peak grad:     5.0 mmHg AV Area (VTI):     2.09 cm      PR End Diast Vel: 3.88 msec AV Vmax:           154.00 cm/s AV Vmean:          103.000 cm/s AV VTI:            0.270 m AV Peak Grad:      9.5 mmHg AV Mean Grad:      5.0 mmHg LVOT Vmax:         141.00 cm/s LVOT Vmean:  96.200 cm/s LVOT VTI:          0.249 m LVOT/AV VTI ratio: 0.92 AI PHT:            624 msec AR Vena Contracta: 0.55 cm  AORTA Ao Root diam: 3.10 cm Ao Asc diam:  3.60 cm MITRAL VALVE                TRICUSPID VALVE MV Area (PHT): 3.99 cm     TR Peak grad:   41.5 mmHg MV Decel Time: 190 msec     TR Mean grad:   27.0 mmHg MV E velocity: 72.70 cm/s   TR Vmax:        322.00 cm/s MV A velocity: 132.00 cm/s  TR Vmean:       251.0 cm/s MV E/A ratio:  0.55                             SHUNTS                             Systemic VTI:  0.25 m                             Systemic Diam: 1.70 cm Dalton McleanMD Electronically signed  by Wilfred Lacy Signature Date/Time: 06/02/2023/10:26:17 AM    Final    CT CHEST ABDOMEN PELVIS WO CONTRAST  Result Date: 06/01/2023 CLINICAL DATA:  87 year old female with history of sepsis. EXAM: CT CHEST, ABDOMEN AND PELVIS WITHOUT CONTRAST TECHNIQUE: Multidetector CT imaging of the chest, abdomen and pelvis was performed following the standard protocol without IV contrast. RADIATION DOSE REDUCTION: This exam was performed according to the departmental dose-optimization program which includes automated exposure control, adjustment of the mA and/or kV according to patient size and/or use of iterative reconstruction technique. COMPARISON:  CT of the abdomen and pelvis 09/23/2022. CT of the chest, abdomen and pelvis 08/17/2019. FINDINGS: CT CHEST FINDINGS Cardiovascular: Heart size is normal. There is no significant pericardial fluid, thickening or pericardial calcification. There is aortic atherosclerosis, as well as atherosclerosis of the great vessels of the mediastinum and the coronary arteries, including calcified atherosclerotic plaque in the left anterior descending and right coronary arteries. Severe calcifications of the mitral annulus. Mediastinum/Nodes: No pathologically enlarged mediastinal or hilar lymph nodes. Please note that accurate exclusion of hilar adenopathy is limited on noncontrast CT scans. Moderate to large hiatal hernia. No axillary lymphadenopathy. Lungs/Pleura: Numerous small calcified granulomas. Linear area of architectural distortion in the periphery of the right upper lobe, similar to the prior study, most compatible with an area of chronic post infectious or inflammatory scarring (axial image 62 of series 5). Multiple small subcentimeter ground-glass attenuation nodules scattered throughout the lungs bilaterally, many of which appear similar to the prior study in retrospect. No other larger more suspicious appearing pulmonary nodules or masses are noted. No acute consolidative  airspace disease. No pleural effusions. Musculoskeletal: There are no aggressive appearing lytic or blastic lesions noted in the visualized portions of the skeleton. CT ABDOMEN PELVIS FINDINGS Hepatobiliary: No definite suspicious cystic or solid hepatic lesions are confidently identified on today's noncontrast CT examination. Unenhanced appearance of the gallbladder is unremarkable. Pancreas: No definite pancreatic mass or peripancreatic fluid collections are noted on today's noncontrast CT examination. Spleen: Unremarkable. Adrenals/Urinary Tract: 6 mm high attenuation lesion in the anterior aspect of  the interpolar region of the right kidney (axial image 74 of series 3), incompletely characterized on today's noncontrast CT examination, but statistically likely a small proteinaceous/hemorrhagic cyst. Unenhanced appearance of the left kidney and bilateral adrenal glands is otherwise unremarkable in appearance. No hydroureteronephrosis. Urinary bladder is unremarkable in appearance. Stomach/Bowel: The appearance of the stomach is unremarkable. No pathologic dilatation of small bowel or colon. Extensive mural thickening is noted throughout the transverse colon where there are surrounding inflammatory changes, concerning for acute colitis. Several colonic diverticuli are also noted, predominantly in the distal descending colon, without definite focal surrounding inflammatory changes to indicate an acute diverticulitis at this time. The appendix is not confidently identified and may be surgically absent. Regardless, there are no inflammatory changes noted adjacent to the cecum to suggest the presence of an acute appendicitis at this time. Vascular/Lymphatic: Atherosclerosis in the abdominal aorta and pelvic vasculature. No lymphadenopathy noted in the abdomen or pelvis. Reproductive: Status post hysterectomy. Ovaries are not confidently identified may be surgically absent or atrophic. Other: No significant volume of  ascites.  No pneumoperitoneum. Musculoskeletal: Rod and screw fixation hardware throughout the lumbar spine. There are no aggressive appearing lytic or blastic lesions noted in the visualized portions of the skeleton. IMPRESSION: 1. Extensive mural thickening and surrounding inflammatory changes associated with the transverse colon, concerning for acute colitis. 2. No acute findings are noted in the thorax to account for the patient's symptoms. 3. Mild colonic diverticulosis without definite findings to suggest acute diverticulitis at this time. 4. Indeterminate 6 mm high attenuation lesion in the right kidney, strongly favored to represent a proteinaceous/hemorrhagic cysts, but not characterized on today's noncontrast CT examination. This could be definitively characterized with nonemergent abdominal MRI with and without IV gadolinium if of clinical concern. 5. Moderate to large hiatal hernia. 6. Aortic atherosclerosis, in addition to left anterior descending and right coronary artery disease. 7. There are calcifications of the mitral annulus. Echocardiographic correlation for evaluation of potential valvular dysfunction may be warranted if clinically indicated. Electronically Signed   By: Trudie Reed M.D.   On: 06/01/2023 11:15   CT Head Wo Contrast  Result Date: 06/01/2023 CLINICAL DATA:  Stuck on the toilet, unable to get up for several hours, with subsequent purple legs and feet. EXAM: CT HEAD WITHOUT CONTRAST TECHNIQUE: Contiguous axial images were obtained from the base of the skull through the vertex without intravenous contrast. RADIATION DOSE REDUCTION: This exam was performed according to the departmental dose-optimization program which includes automated exposure control, adjustment of the mA and/or kV according to patient size and/or use of iterative reconstruction technique. COMPARISON:  Mar 04, 2005 FINDINGS: Brain: There is generalized cerebral atrophy with widening of the extra-axial spaces  and ventricular dilatation. There are areas of decreased attenuation within the white matter tracts of the supratentorial brain, consistent with microvascular disease changes. Vascular: Moderate severity bilateral cavernous carotid artery calcification is noted. Skull: Normal. Negative for fracture or focal lesion. Sinuses/Orbits: No acute finding. Other: None. IMPRESSION: 1. Generalized cerebral atrophy with chronic white matter small vessel ischemic changes. 2. No acute intracranial abnormality. Electronically Signed   By: Aram Candela M.D.   On: 06/01/2023 04:27   DG Chest Port 1 View  Result Date: 06/01/2023 CLINICAL DATA:  Syncope EXAM: PORTABLE CHEST 1 VIEW COMPARISON:  None Available. FINDINGS: The heart size and mediastinal contours are within normal limits. Both lungs are clear. The visualized skeletal structures are unremarkable. IMPRESSION: No active disease. Electronically Signed   By: Caryn Bee  Chase Picket M.D.   On: 06/01/2023 02:28    Pending Labs Unresulted Labs (From admission, onward)     Start     Ordered   06/01/23 1009  Culture, blood (Routine X 2) w Reflex to ID Panel  BLOOD CULTURE X 2,   R (with TIMED occurrences)      06/01/23 1009   06/01/23 0148  Urinalysis, w/ Reflex to Culture (Infection Suspected) -Urine, Clean Catch  Once,   URGENT       Question:  Specimen Source  Answer:  Urine, Clean Catch   06/01/23 0147            Vitals/Pain Today's Vitals   06/02/23 0200 06/02/23 0545 06/02/23 0852 06/02/23 0900  BP: (!) 110/50 (!) 126/57  (!) 130/59  Pulse: 79 80  87  Resp: 19 15    Temp: 98.4 F (36.9 C) 98.3 F (36.8 C)  98.1 F (36.7 C)  TempSrc: Oral Oral  Oral  SpO2: 99% 100%  100%  Weight:      Height:      PainSc:   8      Isolation Precautions No active isolations  Medications Medications  pantoprazole (PROTONIX) injection 40 mg (40 mg Intravenous Given 06/01/23 2214)  cefTRIAXone (ROCEPHIN) 2 g in sodium chloride 0.9 % 100 mL IVPB (0 g  Intravenous Stopped 06/01/23 1456)  metroNIDAZOLE (FLAGYL) IVPB 500 mg (0 mg Intravenous Stopped 06/01/23 2318)  dextrose 5 %-0.9 % sodium chloride infusion ( Intravenous New Bag/Given 06/02/23 0859)  linezolid (ZYVOX) IVPB 600 mg (0 mg Intravenous Stopped 06/01/23 2225)  prochlorperazine (COMPAZINE) injection 5 mg (5 mg Intravenous Given 06/01/23 1509)  acetaminophen (TYLENOL) tablet 650 mg (has no administration in time range)  sodium chloride 0.9 % bolus 1,000 mL (0 mLs Intravenous Stopped 06/01/23 0710)    Mobility walks with person assist     FocusedHypoension Assessments hypotension   R Recommendations: See Admitting Provider Note  Report given to:   Additional Notes: patient uses  walker at home she is weak and PT seen her few minutes and he is suggesting her go somewhere for rehab.

## 2023-06-02 NOTE — Progress Notes (Signed)
Pt arrived from ED to [address] [place] today, at approximately 1142. Pt is alert and oriented x4, on room air, and endorsed 8/10 chronic bilateral groin pain and low back pain, managed with PRN pain medication. Fall precautions in place, and pt in NAD at this time, with daughter-in-law Elease Hashimoto at bedside.

## 2023-06-02 NOTE — Progress Notes (Signed)
PROGRESS NOTE    Julie Rice  ZOX:[id number] DOB: Nov 20, 1935 DOA: 06/01/2023 PCP: Shirline Frees, NP    Brief Narrative:   Julie Rice is a 87 y.o. female with PMH of CAD, HFpEF, PAF/SVT on Eliquis, COPD/chronic hypoxic RF, esophagitis with stricture status post dilation was brought to the hospital by EMS after she was unable to get out of toilet seat unsure about loss of consciousness.  Per EMS, patient was hypotensive with BP down to 70s/50s that  improved after IV fluid.  Patient also reported numbness and tingling in her arms or legs, constipation. On Eliquis for atrial fibrillation. Patient reports ambulating with rollator and has had frequent falls due to losing balance. In ED, vitals were stable.  Creatinine was elevated at 1.9   Lactic acid 4.0 but improved to 1.2.  WBC 25.5 with left shift.  Hgb 13.5>> 12.7.  EKG with new LBBB.  CT head and CXR without acute finding.  Patient had a large dark red and loose bowel movements while in ED. Hemoccult was positive.  Patient was then started on IV PPI and was then admitted hospital for further evaluation and treatment.  Assessment and plan.  Possible syncope/hypotension:  Abnormal CT scan with possible colitis. Patient was hypotensive 70/ 50 when EMS presented.  Had leukocytosis with lactic acidosis.  No chest pain but EKG with LBBB.  Received IV fluids.  Blood cultures pending.  On broad-spectrum antibiotic including linezolid, Rocephin and Flagyl.  Check orthostatic vitals.   2D echocardiogram with LV ejection fraction of 55 to 60% with mild concentric LVH and grade 1 diastolic dysfunction..   AKI:  Was hypotensive on presentation.  Creatinine today at 1.1 from 1.9 yesterday.  Was on diuretics at home.  Currently on hold.  Received IV fluids.  Check BMP today.  Baseline creatinine of 0.8 on November 2023. Strict intake and output charting, daily weights.   Rectal bleed:  Noted in the ED.  On Eliquis at home.  Last dose the morning of  7/27.  Continue PPI.  Type and cross.  Transfuse as necessary.  FOBT was positive.  GI Dr. Elnoria Howard was consulted from the ED.  Follow GI recommendations.  Latest hemoglobin of 10.5 from initial 13.5.Marland Kitchen   Chronic COPD/chronic hypoxic respiratory failure. Continue inhalers from home.   Chronic HFpEF: Has trace edema.  Otherwise stable.  Strict intake and output charting, daily weights.  Hold Lasix for now.    History of CAD/new LBBB: No chest pain but new LBBB on EKG. troponins negative.  Check 2D echocardiogram.  Hold amiodarone metoprolol for now.   Paroxysmal A-fib: Currently in sinus rhythm.  On amiodarone, metoprolol and Eliquis at home. Hold Eliquis.  Will resume amiodarone and metoprolol at this time   Lactic acidosis: Likely in the setting of hypotension.  Low suspicion for infectious process.  Resolved.  Latest lactate of 1.2.  Leukocytosis/bandemia On antibiotics with rocephin, zyvox and metronidazole.  Will continue to monitor.    DVT prophylaxis: SCDs Start: 06/01/23 0951 SCDs Start: 06/01/23 0948   Code Status:     Code Status: DNR  Disposition: Uncertain at this time.  Will get PT OT evaluation.  Status is: Inpatient Remains inpatient appropriate because: Colitis, AKI, GI bleed   Family Communication: Spoke with the patient's family at bedside.  Consultants:  GI  Procedures:  None yet  Antimicrobials:  Rocephin, linezolid, metronidazole  Anti-infectives (From admission, onward)    Start     Dose/Rate Route Frequency Ordered  Stop   06/01/23 1045  linezolid (ZYVOX) IVPB 600 mg        600 mg 300 mL/hr over 60 Minutes Intravenous Every 12 hours 06/01/23 1044     06/01/23 1015  cefTRIAXone (ROCEPHIN) 2 g in sodium chloride 0.9 % 100 mL IVPB        2 g 200 mL/hr over 30 Minutes Intravenous Daily 06/01/23 1011     06/01/23 1015  metroNIDAZOLE (FLAGYL) IVPB 500 mg        500 mg 100 mL/hr over 60 Minutes Intravenous Every 12 hours 06/01/23 1011           Subjective: Today, patient was seen and examined at bedside.  Complains of mild left leg and knee pain.  Received Tylenol.  No further bowel movements.  Complains of nausea and abdominal discomfort.  Family at bedside.  Objective: Vitals:   06/01/23 2300 06/02/23 0200 06/02/23 0545 06/02/23 0900  BP: 105/82 (!) 110/50 (!) 126/57 (!) 130/59  Pulse: 76 79 80 87  Resp: 15 19 15    Temp:  98.4 F (36.9 C) 98.3 F (36.8 C) 98.1 F (36.7 C)  TempSrc:  Oral Oral Oral  SpO2: 98% 99% 100% 100%  Weight:      Height:        Intake/Output Summary (Last 24 hours) at 06/02/2023 1132 Last data filed at 06/01/2023 2318 Gross per 24 hour  Intake 100 ml  Output --  Net 100 ml   Filed Weights   06/01/23 0145  Weight: 68 kg    Physical Examination: Body mass index is 28.45 kg/m.   General:  Average built, not in obvious distress, elderly female. HENT:   No scleral pallor or icterus noted. Oral mucosa is moist.  Chest:  Clear breath sounds.  Diminished breath sounds bilaterally. No crackles or wheezes.  CVS: S1 &S2 heard. No murmur.  Regular rate and rhythm. Abdomen: Soft, nontender, nondistended.  Bowel sounds are heard.   Extremities: No cyanosis, clubbing with trace bilateral lower extremity edema, peripheral pulses are palpable. Psych: Alert, awake and oriented, normal mood CNS:  No cranial nerve deficits.  Power equal in all extremities.   Skin: Warm and dry.  No rashes noted.  Data Reviewed:   CBC: Recent Labs  Lab 06/01/23 0154 06/01/23 0541 06/01/23 0815 06/01/23 2312 06/02/23 0548 06/02/23 0550  WBC 25.5*  --   --   --  17.3*  --   NEUTROABS 23.2*  --   --   --   --   --   HGB 13.5 12.7 12.0 10.5* 10.5* 10.4*  HCT 42.3 39.8 37.8 34.0* 33.0* 32.8*  MCV 101.2*  --   --   --  98.5  --   PLT 267  --   --   --  225  --     Basic Metabolic Panel: Recent Labs  Lab 06/01/23 0154 06/02/23 0548 06/02/23 0550  NA 138 138  --   K 4.6 4.2  --   CL 105 105  --    CO2 19* 26  --   GLUCOSE 163* 120*  --   BUN 50* 38*  --   CREATININE 1.92* 1.14*  --   CALCIUM 8.2* 8.3*  --   MG  --   --  2.4    Liver Function Tests: Recent Labs  Lab 06/01/23 0154  AST 31  ALT 25  ALKPHOS 179*  BILITOT 1.1  PROT 6.2*  ALBUMIN 3.4*  Radiology Studies: ECHOCARDIOGRAM COMPLETE  Result Date: 06/02/2023    ECHOCARDIOGRAM REPORT   Patient Name:   Julie Rice Date of Exam: 06/02/2023 Medical Rec #:  [id number]        Height:       61.0 in Accession #:    [id number]       Weight:       150.0 lb Date of Birth:  1936/10/20         BSA:          1.671 m Patient Age:    87 years         BP:           130/59 mmHg Patient Gender: F                HR:           85 bpm. Exam Location:  Inpatient Procedure: 2D Echo, Cardiac Doppler and Color Doppler Indications:    Other abnormalities of the heart R00.8                 CAD  History:        Patient has prior history of Echocardiogram examinations, most                 recent 12/08/2022. CAD, Carotid Disease and COPD; Risk                 Factors:Non-Smoker and Dyslipidemia.  Sonographer:    Dondra Prader RVT RCS Referring Phys: Boyce Medici GONFA IMPRESSIONS  1. Left ventricular ejection fraction, by estimation, is 55 to 60%. The left ventricle has normal function. The left ventricle has no regional wall motion abnormalities. There is mild concentric left ventricular hypertrophy. Left ventricular diastolic parameters are consistent with Grade I diastolic dysfunction (impaired relaxation).  2. Right ventricular systolic function is normal. The right ventricular size is mildly enlarged. There is mildly elevated pulmonary artery systolic pressure. The estimated right ventricular systolic pressure is 44.5 mmHg.  3. Left atrial size was moderately dilated.  4. Right atrial size was moderately dilated.  5. The mitral valve is normal in structure. Mild mitral valve regurgitation. No evidence of mitral stenosis. Moderate mitral annular  calcification.  6. The tricuspid valve is abnormal. Tricuspid valve regurgitation is moderate.  7. The aortic valve is tricuspid. There is mild calcification of the aortic valve. Aortic valve regurgitation is mild. No aortic stenosis is present.  8. The inferior vena cava is normal in size with greater than 50% respiratory variability, suggesting right atrial pressure of 3 mmHg. FINDINGS  Left Ventricle: Left ventricular ejection fraction, by estimation, is 55 to 60%. The left ventricle has normal function. The left ventricle has no regional wall motion abnormalities. The left ventricular internal cavity size was normal in size. There is  mild concentric left ventricular hypertrophy. Left ventricular diastolic parameters are consistent with Grade I diastolic dysfunction (impaired relaxation). Right Ventricle: The right ventricular size is mildly enlarged. No increase in right ventricular wall thickness. Right ventricular systolic function is normal. There is mildly elevated pulmonary artery systolic pressure. The tricuspid regurgitant velocity is 3.22 m/s, and with an assumed right atrial pressure of 3 mmHg, the estimated right ventricular systolic pressure is 44.5 mmHg. Left Atrium: Left atrial size was moderately dilated. Right Atrium: Right atrial size was moderately dilated. Pericardium: There is no evidence of pericardial effusion. Mitral Valve: The mitral valve is normal in structure. There is mild calcification of the mitral  valve leaflet(s). Moderate mitral annular calcification. Mild mitral valve regurgitation. No evidence of mitral valve stenosis. Tricuspid Valve: The tricuspid valve is abnormal. Tricuspid valve regurgitation is moderate. Aortic Valve: The aortic valve is tricuspid. There is mild calcification of the aortic valve. Aortic valve regurgitation is mild. Aortic regurgitation PHT measures 624 msec. No aortic stenosis is present. Aortic valve mean gradient measures 5.0 mmHg. Aortic valve peak  gradient measures 9.5 mmHg. Aortic valve area, by VTI measures 2.09 cm. Pulmonic Valve: The pulmonic valve was normal in structure. Pulmonic valve regurgitation is not visualized. Aorta: The aortic root is normal in size and structure. Venous: The inferior vena cava is normal in size with greater than 50% respiratory variability, suggesting right atrial pressure of 3 mmHg. IAS/Shunts: No atrial level shunt detected by color flow Doppler.  LEFT VENTRICLE PLAX 2D LVIDd:         3.85 cm   Diastology LVIDs:         2.35 cm   LV e' medial:    6.00 cm/s LV PW:         1.15 cm   LV E/e' medial:  12.1 LV IVS:        1.25 cm   LV e' lateral:   4.60 cm/s LVOT diam:     1.70 cm   LV E/e' lateral: 15.8 LV SV:         57 LV SV Index:   34 LVOT Area:     2.27 cm  RIGHT VENTRICLE             IVC RV Basal diam:  5.00 cm     IVC diam: 1.20 cm RV S prime:     19.30 cm/s TAPSE (M-mode): 2.9 cm LEFT ATRIUM             Index        RIGHT ATRIUM           Index LA diam:        3.70 cm 2.21 cm/m   RA Area:     22.50 cm LA Vol (A2C):   82.0 ml 49.06 ml/m  RA Volume:   56.30 ml  33.68 ml/m LA Vol (A4C):   52.9 ml 31.65 ml/m LA Biplane Vol: 67.0 ml 40.09 ml/m  AORTIC VALVE                     PULMONIC VALVE AV Area (Vmax):    2.08 cm      PV Vmax:          1.12 m/s AV Area (Vmean):   2.12 cm      PV Peak grad:     5.0 mmHg AV Area (VTI):     2.09 cm      PR End Diast Vel: 3.88 msec AV Vmax:           154.00 cm/s AV Vmean:          103.000 cm/s AV VTI:            0.270 m AV Peak Grad:      9.5 mmHg AV Mean Grad:      5.0 mmHg LVOT Vmax:         141.00 cm/s LVOT Vmean:        96.200 cm/s LVOT VTI:          0.249 m LVOT/AV VTI ratio: 0.92 AI PHT:  624 msec AR Vena Contracta: 0.55 cm  AORTA Ao Root diam: 3.10 cm Ao Asc diam:  3.60 cm MITRAL VALVE                TRICUSPID VALVE MV Area (PHT): 3.99 cm     TR Peak grad:   41.5 mmHg MV Decel Time: 190 msec     TR Mean grad:   27.0 mmHg MV E velocity: 72.70 cm/s   TR Vmax:         322.00 cm/s MV A velocity: 132.00 cm/s  TR Vmean:       251.0 cm/s MV E/A ratio:  0.55                             SHUNTS                             Systemic VTI:  0.25 m                             Systemic Diam: 1.70 cm Dalton McleanMD Electronically signed by Wilfred Lacy Signature Date/Time: 06/02/2023/10:26:17 AM    Final    CT CHEST ABDOMEN PELVIS WO CONTRAST  Result Date: 06/01/2023 CLINICAL DATA:  87 year old female with history of sepsis. EXAM: CT CHEST, ABDOMEN AND PELVIS WITHOUT CONTRAST TECHNIQUE: Multidetector CT imaging of the chest, abdomen and pelvis was performed following the standard protocol without IV contrast. RADIATION DOSE REDUCTION: This exam was performed according to the departmental dose-optimization program which includes automated exposure control, adjustment of the mA and/or kV according to patient size and/or use of iterative reconstruction technique. COMPARISON:  CT of the abdomen and pelvis 09/23/2022. CT of the chest, abdomen and pelvis 08/17/2019. FINDINGS: CT CHEST FINDINGS Cardiovascular: Heart size is normal. There is no significant pericardial fluid, thickening or pericardial calcification. There is aortic atherosclerosis, as well as atherosclerosis of the great vessels of the mediastinum and the coronary arteries, including calcified atherosclerotic plaque in the left anterior descending and right coronary arteries. Severe calcifications of the mitral annulus. Mediastinum/Nodes: No pathologically enlarged mediastinal or hilar lymph nodes. Please note that accurate exclusion of hilar adenopathy is limited on noncontrast CT scans. Moderate to large hiatal hernia. No axillary lymphadenopathy. Lungs/Pleura: Numerous small calcified granulomas. Linear area of architectural distortion in the periphery of the right upper lobe, similar to the prior study, most compatible with an area of chronic post infectious or inflammatory scarring (axial image 62 of series 5). Multiple  small subcentimeter ground-glass attenuation nodules scattered throughout the lungs bilaterally, many of which appear similar to the prior study in retrospect. No other larger more suspicious appearing pulmonary nodules or masses are noted. No acute consolidative airspace disease. No pleural effusions. Musculoskeletal: There are no aggressive appearing lytic or blastic lesions noted in the visualized portions of the skeleton. CT ABDOMEN PELVIS FINDINGS Hepatobiliary: No definite suspicious cystic or solid hepatic lesions are confidently identified on today's noncontrast CT examination. Unenhanced appearance of the gallbladder is unremarkable. Pancreas: No definite pancreatic mass or peripancreatic fluid collections are noted on today's noncontrast CT examination. Spleen: Unremarkable. Adrenals/Urinary Tract: 6 mm high attenuation lesion in the anterior aspect of the interpolar region of the right kidney (axial image 74 of series 3), incompletely characterized on today's noncontrast CT examination, but statistically likely a small proteinaceous/hemorrhagic cyst. Unenhanced appearance of the left  kidney and bilateral adrenal glands is otherwise unremarkable in appearance. No hydroureteronephrosis. Urinary bladder is unremarkable in appearance. Stomach/Bowel: The appearance of the stomach is unremarkable. No pathologic dilatation of small bowel or colon. Extensive mural thickening is noted throughout the transverse colon where there are surrounding inflammatory changes, concerning for acute colitis. Several colonic diverticuli are also noted, predominantly in the distal descending colon, without definite focal surrounding inflammatory changes to indicate an acute diverticulitis at this time. The appendix is not confidently identified and may be surgically absent. Regardless, there are no inflammatory changes noted adjacent to the cecum to suggest the presence of an acute appendicitis at this time. Vascular/Lymphatic:  Atherosclerosis in the abdominal aorta and pelvic vasculature. No lymphadenopathy noted in the abdomen or pelvis. Reproductive: Status post hysterectomy. Ovaries are not confidently identified may be surgically absent or atrophic. Other: No significant volume of ascites.  No pneumoperitoneum. Musculoskeletal: Rod and screw fixation hardware throughout the lumbar spine. There are no aggressive appearing lytic or blastic lesions noted in the visualized portions of the skeleton. IMPRESSION: 1. Extensive mural thickening and surrounding inflammatory changes associated with the transverse colon, concerning for acute colitis. 2. No acute findings are noted in the thorax to account for the patient's symptoms. 3. Mild colonic diverticulosis without definite findings to suggest acute diverticulitis at this time. 4. Indeterminate 6 mm high attenuation lesion in the right kidney, strongly favored to represent a proteinaceous/hemorrhagic cysts, but not characterized on today's noncontrast CT examination. This could be definitively characterized with nonemergent abdominal MRI with and without IV gadolinium if of clinical concern. 5. Moderate to large hiatal hernia. 6. Aortic atherosclerosis, in addition to left anterior descending and right coronary artery disease. 7. There are calcifications of the mitral annulus. Echocardiographic correlation for evaluation of potential valvular dysfunction may be warranted if clinically indicated. Electronically Signed   By: Trudie Reed M.D.   On: 06/01/2023 11:15   CT Head Wo Contrast  Result Date: 06/01/2023 CLINICAL DATA:  Stuck on the toilet, unable to get up for several hours, with subsequent purple legs and feet. EXAM: CT HEAD WITHOUT CONTRAST TECHNIQUE: Contiguous axial images were obtained from the base of the skull through the vertex without intravenous contrast. RADIATION DOSE REDUCTION: This exam was performed according to the departmental dose-optimization program which  includes automated exposure control, adjustment of the mA and/or kV according to patient size and/or use of iterative reconstruction technique. COMPARISON:  Mar 04, 2005 FINDINGS: Brain: There is generalized cerebral atrophy with widening of the extra-axial spaces and ventricular dilatation. There are areas of decreased attenuation within the white matter tracts of the supratentorial brain, consistent with microvascular disease changes. Vascular: Moderate severity bilateral cavernous carotid artery calcification is noted. Skull: Normal. Negative for fracture or focal lesion. Sinuses/Orbits: No acute finding. Other: None. IMPRESSION: 1. Generalized cerebral atrophy with chronic white matter small vessel ischemic changes. 2. No acute intracranial abnormality. Electronically Signed   By: Aram Candela M.D.   On: 06/01/2023 04:27   DG Chest Port 1 View  Result Date: 06/01/2023 CLINICAL DATA:  Syncope EXAM: PORTABLE CHEST 1 VIEW COMPARISON:  None Available. FINDINGS: The heart size and mediastinal contours are within normal limits. Both lungs are clear. The visualized skeletal structures are unremarkable. IMPRESSION: No active disease. Electronically Signed   By: Deatra Robinson M.D.   On: 06/01/2023 02:28      LOS: 1 day    Joycelyn Das, MD [facility] Available via Epic secure chat 7am-7pm After these  hours, please refer to coverage provider listed on [email or web address] 06/02/2023, 11:32 AM

## 2023-06-03 DIAGNOSIS — I4891 Unspecified atrial fibrillation: Secondary | ICD-10-CM

## 2023-06-03 DIAGNOSIS — R109 Unspecified abdominal pain: Secondary | ICD-10-CM | POA: Diagnosis not present

## 2023-06-03 DIAGNOSIS — K529 Noninfective gastroenteritis and colitis, unspecified: Secondary | ICD-10-CM

## 2023-06-03 DIAGNOSIS — K921 Melena: Secondary | ICD-10-CM | POA: Diagnosis not present

## 2023-06-03 DIAGNOSIS — N179 Acute kidney failure, unspecified: Secondary | ICD-10-CM | POA: Diagnosis not present

## 2023-06-03 DIAGNOSIS — A419 Sepsis, unspecified organism: Secondary | ICD-10-CM | POA: Diagnosis not present

## 2023-06-03 DIAGNOSIS — R7401 Elevation of levels of liver transaminase levels: Secondary | ICD-10-CM

## 2023-06-03 DIAGNOSIS — I5032 Chronic diastolic (congestive) heart failure: Secondary | ICD-10-CM | POA: Diagnosis not present

## 2023-06-03 DIAGNOSIS — Z7901 Long term (current) use of anticoagulants: Secondary | ICD-10-CM

## 2023-06-03 LAB — BASIC METABOLIC PANEL WITH GFR
Anion gap: 8 (ref 5–15)
BUN: 21 mg/dL (ref 8–23)
CO2: 25 mmol/L (ref 22–32)
Calcium: 8.2 mg/dL — ABNORMAL LOW (ref 8.9–10.3)
Chloride: 108 mmol/L (ref 98–111)
Creatinine, Ser: 0.87 mg/dL (ref 0.44–1.00)
GFR, Estimated: >60 mL/min (ref >60)
Glucose, Bld: 112 mg/dL — ABNORMAL HIGH (ref 70–99)
Potassium: 3.9 mmol/L (ref 3.5–5.1)
Sodium: 141 mmol/L (ref 135–145)

## 2023-06-03 LAB — GLUCOSE, CAPILLARY: Glucose-Capillary: 112 mg/dL — ABNORMAL HIGH (ref 70–99)

## 2023-06-03 MED ORDER — HYDROMORPHONE HCL 1 MG/ML IJ SOLN
0.5000 mg | INTRAMUSCULAR | Status: DC | PRN
  Administered 2023-06-03 – 2023-06-06 (×2): 0.5 mg via INTRAVENOUS
  Filled 2023-06-03 (×2): qty 0.5

## 2023-06-03 MED ORDER — PEG-KCL-NACL-NASULF-NA ASC-C 100 G PO SOLR: 1.0000 | Freq: Once | ORAL | Status: DC

## 2023-06-03 MED ORDER — LOSARTAN POTASSIUM 50 MG PO TABS
50.0000 mg | ORAL_TABLET | Freq: Every day | ORAL | Status: DC
  Administered 2023-06-03 – 2023-06-06 (×4): 50 mg via ORAL
  Filled 2023-06-03 (×4): qty 1

## 2023-06-03 MED ORDER — CLONAZEPAM 0.5 MG PO TABS
0.5000 mg | ORAL_TABLET | Freq: Once | ORAL | Status: AC
  Administered 2023-06-03: 0.5 mg via ORAL
  Filled 2023-06-03: qty 1

## 2023-06-03 MED ORDER — TRIMETHOBENZAMIDE HCL 100 MG/ML IM SOLN
200.0000 mg | Freq: Once | INTRAMUSCULAR | Status: AC
  Administered 2023-06-03: 200 mg via INTRAMUSCULAR
  Filled 2023-06-03: qty 2

## 2023-06-03 MED ORDER — ROSUVASTATIN CALCIUM 20 MG PO TABS
20.0000 mg | ORAL_TABLET | Freq: Every day | ORAL | Status: DC
  Administered 2023-06-03 – 2023-06-05 (×3): 20 mg via ORAL
  Filled 2023-06-03 (×3): qty 1

## 2023-06-03 MED ORDER — PROCHLORPERAZINE EDISYLATE 10 MG/2ML IJ SOLN
5.0000 mg | Freq: Four times a day (QID) | INTRAMUSCULAR | Status: DC | PRN
  Administered 2023-06-03 – 2023-06-04 (×2): 5 mg via INTRAVENOUS
  Filled 2023-06-03 (×2): qty 2

## 2023-06-03 MED ORDER — PEG-KCL-NACL-NASULF-NA ASC-C 100 G PO SOLR: 0.5000 | Freq: Once | ORAL | Status: DC

## 2023-06-03 MED ORDER — PEG-KCL-NACL-NASULF-NA ASC-C 100 G PO SOLR
0.5000 | Freq: Once | ORAL | Status: DC
  Filled 2023-06-03: qty 1

## 2023-06-03 MED ORDER — HYDRALAZINE HCL 20 MG/ML IJ SOLN
5.0000 mg | Freq: Four times a day (QID) | INTRAMUSCULAR | Status: DC | PRN
  Administered 2023-06-06: 5 mg via INTRAVENOUS
  Filled 2023-06-03: qty 1

## 2023-06-03 MED ORDER — CLONAZEPAM 0.5 MG PO TABS
0.5000 mg | ORAL_TABLET | Freq: Two times a day (BID) | ORAL | Status: DC
  Administered 2023-06-03 – 2023-06-06 (×7): 0.5 mg via ORAL
  Filled 2023-06-03 (×7): qty 1

## 2023-06-03 NOTE — Plan of Care (Signed)

## 2023-06-03 NOTE — Consult Note (Signed)
Consultation  Referring Provider:   Thn Dr. Randol Kern Primary Care Physician:  Shirline Frees, NP Primary Gastroenterologist:  Dr. [name]       Reason for Consultation: Abdominal pain, colitis, melena    DOA: 06/01/2023         Hospital Day: 3         HPI:   Julie Rice is a 87 y.o. female with past medical history significant for CAD, HFpEF, PAF on Eliquis, COPD/chronic hypoxic respiratory failure, esophagitis with stricture status post dilation.   08/07/2017 colonoscopy for IDA and right upper quadrant abdominal pain excellent prep showed internal hemorrhoids otherwise unremarkable. 02/24/2023 EGD with Dr. [name] for dilation and Botox injection showed atonic esophagus with esophagitis and stricture injected with Botox and dilated, normal stomach other than hiatal hernia which is moderately large, normal duodenum no specimens collected.  Presents to the ER with 7/28 with hypotension BP 70s over 50s. GI consulted secondary to large dark red loose bowel movements while in the ER, FOBT positive.  Work up notable for BUN 50, creatinine 1.92  lactic acid 4 but improved to 1.2 with IVF, WBC 25 left shift, 17.3 hemoglobin 13.5-->10.5-- today 9.8, FOBT positive Alk phos mildly elevated at 179, AST 31, ALT 25, normal total bilirubin Unremarkable CT head, negative troponins Blood cultures negative day 2 CT chest abdomen pelvis without contrast due to AKI showed extensive mural thickening and surrounding inflammatory changes transverse colon, mild colonic diverticulosis without diverticulitis, 6 mm lesion right kidney needs nonemergent abdominal MRI with and without, moderate to large hiatal hernia Patient started on linezolid, Rocephin and Flagy in the ER for sepsis secondary to infectious colitis, Eliquis has been held.  No family was present at the time of my evaluation. Patient states she has never had a thing like this prior. She started having lower abdominal cramping all day had  to go to the bathroom and she had black/red [facility] diarrhea every hour, her legs felt very weak with sweating, nausea, lower abdominal pain. She tried to stand from the toilet but was unable to get up, try her life alert which did not work so she was able to call 911 who helped her get off the toilet and coming to the ER.  Where she was found to be hypotensive. Since she has been in the hospital she has not had another bowel movement. She states she continues to have reflux only on Protonix once daily, has had nausea and vomiting with dry heaving and this has continued last episode was last night.  Has had some melena with the stool. EGD with Botox and dilation 02/2021/2024 with moderately large hiatal hernia, patient states dilation and Botox helped some but symptoms have returned.  Continues to have globulus sensation Denies any recent antibiotics, sick contacts, no medications.  Abnormal ED labs: Abnormal Labs Reviewed  COMPREHENSIVE METABOLIC PANEL - Abnormal; Notable for the following components:      Result Value   CO2 19 (*)    Glucose, Bld 163 (*)    BUN 50 (*)    Creatinine, Ser 1.92 (*)    Calcium 8.2 (*)    Total Protein 6.2 (*)    Albumin 3.4 (*)    Alkaline Phosphatase 179 (*)    GFR, Estimated 25 (*)    All other components within normal limits  CBC WITH DIFFERENTIAL/PLATELET - Abnormal; Notable for the following components:   WBC 25.5 (*)    MCV 101.2 (*)  Neutro Abs 23.2 (*)    Monocytes Absolute 1.1 (*)    Abs Immature Granulocytes 0.17 (*)    All other components within normal limits  URINALYSIS, W/ REFLEX TO CULTURE (INFECTION SUSPECTED) - Abnormal; Notable for the following components:   Leukocytes,Ua TRACE (*)    Bacteria, UA RARE (*)    All other components within normal limits  HEMOGLOBIN AND HEMATOCRIT, BLOOD - Abnormal; Notable for the following components:   Hemoglobin 10.5 (*)    HCT 34.0 (*)    All other components within normal limits  HEMOGLOBIN AND  HEMATOCRIT, BLOOD - Abnormal; Notable for the following components:   Hemoglobin 10.4 (*)    HCT 32.8 (*)    All other components within normal limits  PROTIME-INR - Abnormal; Notable for the following components:   Prothrombin Time 16.7 (*)    INR 1.3 (*)    All other components within normal limits  CBC - Abnormal; Notable for the following components:   WBC 17.3 (*)    RBC 3.35 (*)    Hemoglobin 10.5 (*)    HCT 33.0 (*)    All other components within normal limits  BASIC METABOLIC PANEL - Abnormal; Notable for the following components:   Glucose, Bld 120 (*)    BUN 38 (*)    Creatinine, Ser 1.14 (*)    Calcium 8.3 (*)    GFR, Estimated 47 (*)    All other components within normal limits  BASIC METABOLIC PANEL - Abnormal; Notable for the following components:   Glucose, Bld 112 (*)    Calcium 8.2 (*)    All other components within normal limits  CBC - Abnormal; Notable for the following components:   WBC 13.7 (*)    RBC 3.11 (*)    Hemoglobin 9.8 (*)    HCT 30.2 (*)    All other components within normal limits  GLUCOSE, CAPILLARY - Abnormal; Notable for the following components:   Glucose-Capillary 112 (*)    All other components within normal limits  I-STAT CG4 LACTIC ACID, ED - Abnormal; Notable for the following components:   Lactic Acid, Venous 4.0 (*)    All other components within normal limits  POC OCCULT BLOOD, ED - Abnormal; Notable for the following components:   Fecal Occult Bld POSITIVE (*)    All other components within normal limits    Past Medical History:  Diagnosis Date   A-fib (HCC)    Anemia    Anxiety    Bipolar disorder (HCC)    Blood transfusion 1991   autologous pts own blood given    CAD (coronary artery disease)    Cataract    Chest pain    "@ rest, lying down, w/exertion"   CHF (congestive heart failure) (HCC) 07/2020   Chronic back pain    "mostly lower back but I do have upper back pain regularly" (04/06/2018)   COPD (chronic  obstructive pulmonary disease) (HCC)    Depression    Esophageal stricture    Fibromyalgia    GERD (gastroesophageal reflux disease)    Heart murmur    "slight" (04/06/2018)   Hiatal hernia    Hyperlipidemia    Patient denies   Hypertension    Internal hemorrhoids    Lumbago    Mitral regurgitation    Osteoarthritis    Osteoporosis    Pneumonia ~ 01/2018   PONV (postoperative nausea and vomiting)    severe ponv, "in the past" (04/06/2018)   Rectal  bleeding    Rheumatoid arthritis (HCC)    Spondylosis    TIA (transient ischemic attack) 2013   Urinary incontinence    wears depends    Surgical History:  She  has a past surgical history that includes Total knee arthroplasty (Right, ~ 1996); Lumbar laminectomy (1990; 1994; [id number]); Abdominal hysterectomy (1982); Tubal ligation (~ 1976); Dilation and curettage of uterus (1961); Cataract extraction w/ intraocular lens  implant, bilateral (Bilateral, 2010); Bladder suspension (1980's); Hiatal hernia repair (N/A, 08/02/2016); Laparoscopic Nissen fundoplication (N/A, 08/02/2016); Esophageal manometry (N/A, 03/25/2018); Back surgery; Tonsillectomy and adenoidectomy (1945); Hernia repair; Joint replacement; Esophagogastroduodenoscopy (egd) with propofol (N/A, 04/07/2018); Esophagogastroduodenoscopy (egd) with propofol (N/A, 09/21/2018); Balloon dilation (N/A, 09/21/2018); Botox injection (N/A, 09/21/2018); RIGHT/LEFT HEART CATH AND CORONARY ANGIOGRAPHY (N/A, 07/26/2019); Esophagogastroduodenoscopy (egd) with propofol (N/A, 08/12/2019); Botox injection (N/A, 08/12/2019); Balloon dilation (N/A, 08/12/2019); biopsy (08/12/2019); TEE without cardioversion (N/A, 08/19/2019); SVT ABLATION (N/A, 04/25/2021); Esophagogastroduodenoscopy (egd) with propofol (N/A, 09/27/2022); Botox injection (N/A, 09/27/2022); Esophagogastroduodenoscopy (egd) with propofol (N/A, 02/24/2023); and Botox injection (N/A, 02/24/2023). Family History:  Her family history includes Heart attack in  her brother; Heart disease in her brother; Heart disease (age of onset: 71) in her father; Hypertension in her mother; Kidney disease in her mother; Other in her son; Suicidality in her son. Social History:   reports that she has never smoked. She has never used smokeless tobacco. She reports that she does not drink alcohol and does not use drugs.  Prior to Admission medications   Medication Sig Start Date End Date Taking? Authorizing Provider  acetaminophen (TYLENOL) 500 MG tablet Take 1,000 mg by mouth every 6 (six) hours as needed for moderate pain, headache or fever.   Yes [provider]  albuterol (VENTOLIN HFA) 108 (90 Base) MCG/ACT inhaler INHALE TWO PUFFS BY MOUTH EVERY 6 HOURS AS NEEDED FOR WHEEZING OR SHORTNESS OF BREATH Patient taking differently: Inhale 2 puffs into the lungs every 6 (six) hours as needed for wheezing or shortness of breath. 02/26/23  Yes Nafziger, [name], NP  amiodarone (PACERONE) 200 MG tablet NEW PRESCRIPTION REQUEST: TAKE ONE TABLET BY MOUTH DAILY Patient taking differently: Take 200 mg by mouth daily. 02/26/23  Yes Nafziger, [name], NP  buPROPion (WELLBUTRIN XL) 300 MG 24 hr tablet Take 1 tablet (300 mg total) by mouth daily. Patient taking differently: Take 300 mg by mouth daily as needed (mood). 04/01/23  Yes Nafziger, [name], NP  Carboxymethylcellulose Sodium (REFRESH TEARS OP) Place 1 drop into both eyes 2 (two) times daily.   Yes [provider]  cetirizine (ZYRTEC) 10 MG tablet TAKE 1 TABLET BY MOUTH EVERY DAY AS NEEDED FOR ALLERGY Patient taking differently: Take 10 mg by mouth daily as needed for allergies. 05/07/23  Yes Nafziger, [name], NP  clonazePAM (KLONOPIN) 0.5 MG tablet TAKE ONE TABLET BY MOUTH TWICE DAILY 04/15/23  Yes Nafziger, [name], NP  DULoxetine (CYMBALTA) 60 MG capsule Take 1 capsule (60 mg total) by mouth daily. 02/06/23  Yes Nafziger, [name], NP  ELIQUIS 5 MG TABS tablet TAKE 1 TABLET BY MOUTH TWICE A DAY Patient taking differently: Take 5  mg by mouth 2 (two) times daily. 12/03/22  Yes Camnitz, Will Daphine Deutscher, MD  FeFum-FePoly-FA-B Cmp-C-Biot Henreitta Cea) CAPS TAKE ONE CAPSULE BY MOUTH IN THE EARLY MORNING Patient taking differently: Take 1 capsule by mouth in the morning. 03/17/23  Yes Heilingoetter, Cassandra L, PA-C  furosemide (LASIX) 40 MG tablet Take one tablet (40 mg) by mouth every other day alternating with two tablets (80 mg)  on the opposite days. Patient taking differently: Take 40-80 mg by mouth See admin instructions. Take one tablet (40 mg) by mouth every other day alternating with two tablets (80 mg) on the opposite days. 03/12/23  Yes Pricilla Riffle, MD  losartan (COZAAR) 50 MG tablet TAKE 1 TABLET BY MOUTH EVERY DAY 02/07/23  Yes Camnitz, Andree Coss, MD  metoprolol tartrate (LOPRESSOR) 25 MG tablet Take 0.5 tablets (12.5 mg total) by mouth 2 (two) times daily. Patient taking differently: Take 25 mg by mouth daily. 02/07/23  Yes Camnitz, Will Daphine Deutscher, MD  Multiple Vitamins-Minerals (ONE-A-DAY WOMENS 50+) TABS Take 1 tablet by mouth in the morning.   Yes [provider]  pantoprazole (PROTONIX) 40 MG tablet Take 1 tablet (40 mg total) by mouth 2 (two) times daily. 01/16/23  Yes Hilarie Fredrickson, MD  potassium chloride SA (KLOR-CON M) 20 MEQ tablet Take 20 mEq by mouth daily. 05/26/23  Yes [provider]  rosuvastatin (CRESTOR) 20 MG tablet Take 1 tablet (20 mg total) by mouth daily at 6 PM. 02/07/23  Yes Nafziger, [name], NP  sodium chloride (OCEAN) 0.65 % SOLN nasal spray Place 1 spray into both nostrils at bedtime.   Yes [provider]  spironolactone (ALDACTONE) 25 MG tablet NEW PRESCRIPTION REQUEST: TAKE ONE TABLET BY MOUTH DAILY Patient taking differently: Take 25 mg by mouth daily. 02/26/23  Yes Nafziger, [name], NP    Current Facility-Administered Medications  Medication Dose Route Frequency Provider Last Rate Last Admin   acetaminophen (TYLENOL) tablet 650 mg  650 mg Oral Q6H PRN Pokhrel, Laxman, MD    650 mg at 06/02/23 1103   albuterol (PROVENTIL) (2.5 MG/3ML) 0.083% nebulizer solution 2.5 mg  2.5 mg Nebulization Q6H PRN Pham, Minh Q, RPH-CPP       amiodarone (PACERONE) tablet 200 mg  200 mg Oral Daily Pokhrel, Laxman, MD   200 mg at 06/03/23 1131   buPROPion (WELLBUTRIN XL) 24 hr tablet 300 mg  300 mg Oral Daily PRN Pokhrel, Laxman, MD       cefTRIAXone (ROCEPHIN) 2 g in sodium chloride 0.9 % 100 mL IVPB  2 g Intravenous Daily Gonfa, Taye T, MD 200 mL/hr at 06/03/23 1133 2 g at 06/03/23 1133   clonazePAM (KLONOPIN) tablet 0.5 mg  0.5 mg Oral BID Elgergawy, Leana Roe, MD   0.5 mg at 06/03/23 1131   dextrose 5 %-0.9 % sodium chloride infusion   Intravenous Continuous Candelaria Stagers T, MD 60 mL/hr at 06/03/23 0304 New Bag at 06/03/23 0304   hydrALAZINE (APRESOLINE) injection 5 mg  5 mg Intravenous Q6H PRN Elgergawy, Leana Roe, MD       losartan (COZAAR) tablet 50 mg  50 mg Oral Daily Elgergawy, Leana Roe, MD   50 mg at 06/03/23 1130   metoprolol succinate (TOPROL-XL) 24 hr tablet 25 mg  25 mg Oral Daily Pham, Minh Q, RPH-CPP   25 mg at 06/03/23 1131   metroNIDAZOLE (FLAGYL) IVPB 500 mg  500 mg Intravenous Q12H Candelaria Stagers T, MD 100 mL/hr at 06/02/23 2054 500 mg at 06/02/23 2054   multivitamin with minerals tablet 1 tablet  1 tablet Oral q AM Pokhrel, Laxman, MD   1 tablet at 06/03/23 0605   oxyCODONE-acetaminophen (PERCOCET/ROXICET) 5-325 MG per tablet 1 tablet  1 tablet Oral Q6H PRN Pokhrel, Laxman, MD   1 tablet at 06/02/23 1549   pantoprazole (PROTONIX) injection 40 mg  40 mg Intravenous Q12H Gonfa, Boyce Medici, MD   40 mg  at 06/03/23 1130   polyvinyl alcohol (LIQUIFILM TEARS) 1.4 % ophthalmic solution 1 drop  1 drop Both Eyes BID Pokhrel, Laxman, MD   1 drop at 06/03/23 1131   prochlorperazine (COMPAZINE) injection 5 mg  5 mg Intravenous Q6H PRN Almon Hercules, MD   5 mg at 06/03/23 0042   rosuvastatin (CRESTOR) tablet 20 mg  20 mg Oral q1800 Elgergawy, Leana Roe, MD        Allergies as of 06/01/2023 -  Review Complete 06/01/2023  Allergen Reaction Noted   Tape Other (See Comments) 02/22/2016   Tramadol Other (See Comments) 12/01/2020   Morphine Itching, Rash, and Other (See Comments) 08/01/2008    Review of Systems:    Constitutional: No weight loss, fever, chills, weakness or fatigue HEENT: Eyes: No change in vision               Ears, Nose, Throat:  No change in hearing or congestion Skin: No rash or itching Cardiovascular: No chest pain, chest pressure or palpitations   Respiratory: No SOB or cough Gastrointestinal: See HPI and otherwise negative Genitourinary: No dysuria or change in urinary frequency Neurological: No headache, dizziness or syncope Musculoskeletal: No new muscle or joint pain Hematologic: No bleeding or bruising Psychiatric: No history of depression or anxiety     Physical Exam:  Vital signs in last 24 hours: Temp:  [97.9 F (36.6 C)-98 F (36.7 C)] 98 F (36.7 C) (07/30 1233) Pulse Rate:  [68-82] 71 (07/30 1233) Resp:  [14-18] 18 (07/30 1233) BP: (124-153)/(56-80) 124/78 (07/30 1233) SpO2:  [94 %-98 %] 98 % (07/30 1233) Weight:  [71.6 kg] 71.6 kg (07/30 0500) Last BM Date : 06/01/23 Last BM recorded by nurses in past 5 days No data recorded  General:   Pleasant, well developed female in no acute distress Head:  Normocephalic and atraumatic. Eyes: sclerae anicteric,conjunctive pink  Heart:  regular rate and rhythm Pulm: Clear anteriorly; no wheezing Abdomen:  Soft, Obese AB, Sluggish bowel sounds. mild tenderness in the lower abdomen. Without guarding and Without rebound, No organomegaly appreciated. Extremities:  Without edema. Msk:  Symmetrical without gross deformities. Peripheral pulses intact.  Neurologic:  Alert and  oriented x4;  No focal deficits.  Skin:   Dry and intact without significant lesions or rashes. Psychiatric:  Cooperative. Normal mood and affect.  LAB RESULTS: Recent Labs    06/01/23 0154 06/01/23 0541 06/02/23 0548  06/02/23 0550 06/03/23 0147  WBC 25.5*  --  17.3*  --  13.7*  HGB 13.5   < > 10.5* 10.4* 9.8*  HCT 42.3   < > 33.0* 32.8* 30.2*  PLT 267  --  225  --  189   < > = values in this interval not displayed.   BMET Recent Labs    06/01/23 0154 06/02/23 0548 06/03/23 0147  NA 138 138 141  K 4.6 4.2 3.9  CL 105 105 108  CO2 19* 26 25  GLUCOSE 163* 120* 112*  BUN 50* 38* 21  CREATININE 1.92* 1.14* 0.87  CALCIUM 8.2* 8.3* 8.2*   LFT Recent Labs    06/01/23 0154  PROT 6.2*  ALBUMIN 3.4*  AST 31  ALT 25  ALKPHOS 179*  BILITOT 1.1   PT/INR Recent Labs    06/02/23 0550  LABPROT 16.7*  INR 1.3*    STUDIES: ECHOCARDIOGRAM COMPLETE  Result Date: 06/02/2023    ECHOCARDIOGRAM REPORT   Patient Name:   NOTNAMED PAYNE Date of Exam: 06/02/2023 Medical  Rec #:  [id number]        Height:       61.0 in Accession #:    [id number]       Weight:       150.0 lb Date of Birth:  1936-03-13         BSA:          1.671 m Patient Age:    87 years         BP:           130/59 mmHg Patient Gender: F                HR:           85 bpm. Exam Location:  Inpatient Procedure: 2D Echo, Cardiac Doppler and Color Doppler Indications:    Other abnormalities of the heart R00.8                 CAD  History:        Patient has prior history of Echocardiogram examinations, most                 recent 12/08/2022. CAD, Carotid Disease and COPD; Risk                 Factors:Non-Smoker and Dyslipidemia.  Sonographer:    Dondra Prader RVT RCS Referring Phys: Boyce Medici GONFA IMPRESSIONS  1. Left ventricular ejection fraction, by estimation, is 55 to 60%. The left ventricle has normal function. The left ventricle has no regional wall motion abnormalities. There is mild concentric left ventricular hypertrophy. Left ventricular diastolic parameters are consistent with Grade I diastolic dysfunction (impaired relaxation).  2. Right ventricular systolic function is normal. The right ventricular size is mildly enlarged. There is mildly  elevated pulmonary artery systolic pressure. The estimated right ventricular systolic pressure is 44.5 mmHg.  3. Left atrial size was moderately dilated.  4. Right atrial size was moderately dilated.  5. The mitral valve is normal in structure. Mild mitral valve regurgitation. No evidence of mitral stenosis. Moderate mitral annular calcification.  6. The tricuspid valve is abnormal. Tricuspid valve regurgitation is moderate.  7. The aortic valve is tricuspid. There is mild calcification of the aortic valve. Aortic valve regurgitation is mild. No aortic stenosis is present.  8. The inferior vena cava is normal in size with greater than 50% respiratory variability, suggesting right atrial pressure of 3 mmHg. FINDINGS  Left Ventricle: Left ventricular ejection fraction, by estimation, is 55 to 60%. The left ventricle has normal function. The left ventricle has no regional wall motion abnormalities. The left ventricular internal cavity size was normal in size. There is  mild concentric left ventricular hypertrophy. Left ventricular diastolic parameters are consistent with Grade I diastolic dysfunction (impaired relaxation). Right Ventricle: The right ventricular size is mildly enlarged. No increase in right ventricular wall thickness. Right ventricular systolic function is normal. There is mildly elevated pulmonary artery systolic pressure. The tricuspid regurgitant velocity is 3.22 m/s, and with an assumed right atrial pressure of 3 mmHg, the estimated right ventricular systolic pressure is 44.5 mmHg. Left Atrium: Left atrial size was moderately dilated. Right Atrium: Right atrial size was moderately dilated. Pericardium: There is no evidence of pericardial effusion. Mitral Valve: The mitral valve is normal in structure. There is mild calcification of the mitral valve leaflet(s). Moderate mitral annular calcification. Mild mitral valve regurgitation. No evidence of mitral valve stenosis. Tricuspid Valve: The tricuspid  valve is abnormal. Tricuspid valve regurgitation  is moderate. Aortic Valve: The aortic valve is tricuspid. There is mild calcification of the aortic valve. Aortic valve regurgitation is mild. Aortic regurgitation PHT measures 624 msec. No aortic stenosis is present. Aortic valve mean gradient measures 5.0 mmHg. Aortic valve peak gradient measures 9.5 mmHg. Aortic valve area, by VTI measures 2.09 cm. Pulmonic Valve: The pulmonic valve was normal in structure. Pulmonic valve regurgitation is not visualized. Aorta: The aortic root is normal in size and structure. Venous: The inferior vena cava is normal in size with greater than 50% respiratory variability, suggesting right atrial pressure of 3 mmHg. IAS/Shunts: No atrial level shunt detected by color flow Doppler.  LEFT VENTRICLE PLAX 2D LVIDd:         3.85 cm   Diastology LVIDs:         2.35 cm   LV e' medial:    6.00 cm/s LV PW:         1.15 cm   LV E/e' medial:  12.1 LV IVS:        1.25 cm   LV e' lateral:   4.60 cm/s LVOT diam:     1.70 cm   LV E/e' lateral: 15.8 LV SV:         57 LV SV Index:   34 LVOT Area:     2.27 cm  RIGHT VENTRICLE             IVC RV Basal diam:  5.00 cm     IVC diam: 1.20 cm RV S prime:     19.30 cm/s TAPSE (M-mode): 2.9 cm LEFT ATRIUM             Index        RIGHT ATRIUM           Index LA diam:        3.70 cm 2.21 cm/m   RA Area:     22.50 cm LA Vol (A2C):   82.0 ml 49.06 ml/m  RA Volume:   56.30 ml  33.68 ml/m LA Vol (A4C):   52.9 ml 31.65 ml/m LA Biplane Vol: 67.0 ml 40.09 ml/m  AORTIC VALVE                     PULMONIC VALVE AV Area (Vmax):    2.08 cm      PV Vmax:          1.12 m/s AV Area (Vmean):   2.12 cm      PV Peak grad:     5.0 mmHg AV Area (VTI):     2.09 cm      PR End Diast Vel: 3.88 msec AV Vmax:           154.00 cm/s AV Vmean:          103.000 cm/s AV VTI:            0.270 m AV Peak Grad:      9.5 mmHg AV Mean Grad:      5.0 mmHg LVOT Vmax:         141.00 cm/s LVOT Vmean:        96.200 cm/s LVOT VTI:           0.249 m LVOT/AV VTI ratio: 0.92 AI PHT:            624 msec AR Vena Contracta: 0.55 cm  AORTA Ao Root diam: 3.10 cm Ao Asc diam:  3.60 cm MITRAL VALVE  TRICUSPID VALVE MV Area (PHT): 3.99 cm     TR Peak grad:   41.5 mmHg MV Decel Time: 190 msec     TR Mean grad:   27.0 mmHg MV E velocity: 72.70 cm/s   TR Vmax:        322.00 cm/s MV A velocity: 132.00 cm/s  TR Vmean:       251.0 cm/s MV E/A ratio:  0.55                             SHUNTS                             Systemic VTI:  0.25 m                             Systemic Diam: 1.70 cm Dalton McleanMD Electronically signed by Wilfred Lacy Signature Date/Time: 06/02/2023/10:26:17 AM    Final       Impression    Hematochezia with associated nausea, vomiting, AB pain and hypotension on admission improved after IVF In the setting of Eliquis   WBC 13.7 HGB 9.8 MCV 97.1 Platelets 189 06/03/2023 BUN 21 Cr 0.87  12/31/2022 Iron 128 Ferritin 34 B12 488 FOBT positive On Rocephin and Flagyl with trending down of WBC from 25-13  Melena with history of dysphagia/moderate hiatal hernia Most recent endoscopy 02/2023 with dilation and Botox with some improvement of symptoms but returned very quickly. Only on Protonix once daily  Anemia FOBT positive, on Eliquis Hemoglobin initially 13.5 currently 9.8 Last colonoscopy 2018 excellent prep small hemorrhoids unremarkable otherwise no recall due to age EGD 02/24/2023 EGD with Dr. [name] with moderate hiatal hernia, Botox and dilation  Atrial fibrillation on Eliquis Currently on hold  Mildly elevated alkaline phosphatase Normal AST and ALT CT abdomen pelvis with unremarkable liver and bile ducts  Principal Problem:   AKI (acute kidney injury) (HCC) Active Problems:   Rectal bleed   Chronic heart failure with preserved ejection fraction (HFpEF) (HCC)   Chronic respiratory failure with hypoxia (HCC)   COPD (chronic obstructive pulmonary disease) (HCC)   PAF (paroxysmal atrial fibrillation)  (HCC)   Rheumatoid arthritis involving multiple sites (HCC)   Coronary artery disease involving native coronary artery of native heart without angina pectoris   Lactic acidosis   Syncope    LOS: 2 days     Plan   With lower AB discomfort and bleeding, possible ischemic colitis versus infectious colitis, less likely inflammatory with previous normal colonoscopies and CT scans. Last normal colon 2018 -No great evidence for endoscopic evaluation at this time, just had EGD 4/24 and colonoscopy 2018, if patient continues to have transfusion dependent anemia, no improvement of her symptoms, could consider colonoscopy versus flex sigmoidoscopy with Dr. Leonides Schanz. -In the meantime continue supportive care with IVF, ABX, antiemetics --Continue to monitor H&H with transfusion as needed to maintain hemoglobin greater than 7. - collect stool for Cdiff/GI pathogen next BM but without a BM less likely -Advance diet to full liquid diet, and try further from there. -Continue Protonix 40 mg twice daily, has been getting Compazine as needed, will add on Zofran -Medically to hold Eliquis for 1 day and can consider restarting tomorrow following day if hemoglobin remains stable and no signs of active bleeding  Thank you for your kind consultation, we will continue to follow.  Doree Albee  06/03/2023, 12:48 PM

## 2023-06-03 NOTE — NC FL2 (Signed)
[place] MEDICAID FL2 LEVEL OF CARE FORM     IDENTIFICATION  Patient Name: Julie Rice Birthdate: 02/21/1936 Sex: female Admission Date (Current Location): 06/01/2023  [facility] and [place] Number:  Producer, television/film/video and Address:  [facility], [address] [place], [place] [address]      Provider Number: [id number]  Attending Physician Name and Address:  Elgergawy, Leana Roe, MD  Relative Name and Phone Number:       Current Level of Care: Hospital Recommended Level of Care: Skilled Nursing Facility Prior Approval Number:    Date Approved/Denied:   PASRR Number: pending  Discharge Plan: SNF    Current Diagnoses: Patient Active Problem List   Diagnosis Date Noted   AKI (acute kidney injury) (HCC) 06/01/2023   Lactic acidosis 06/01/2023   Syncope 06/01/2023   Generalized weakness 09/28/2022   Temporal arteritis (HCC) 09/28/2022   Arrhythmia 09/22/2022   Idiopathic peripheral neuropathy 07/31/2022   Paroxysmal SVT (supraventricular tachycardia) 05/14/2022   Coronary artery disease involving native coronary artery of native heart without angina pectoris 05/14/2022   Wide-complex tachycardia 04/03/2021   Atrial fibrillation (HCC) 10/15/2020   Chronic pain syndrome 02/16/2020   Fibromyalgia 02/16/2020   Myofascial pain 02/16/2020   Rheumatoid arthritis involving multiple sites (HCC) 02/16/2020   Pleural effusion, bilateral 09/08/2019   Secondary cardiomyopathy (HCC)    Elevated troponin 08/17/2019   Chronic respiratory failure with hypoxia (HCC) 08/16/2019   COPD (chronic obstructive pulmonary disease) (HCC) 08/16/2019   PAF (paroxysmal atrial fibrillation) (HCC) 08/16/2019   Community acquired pneumonia of left lung 08/07/2019   Prolonged QT interval 08/07/2019   Sepsis (HCC) 08/07/2019   Acute on chronic respiratory failure with hypoxia (HCC) 08/07/2019   Chronic heart failure with preserved ejection fraction (HFpEF) (HCC)  07/24/2019   Chest pain    DOE (dyspnea on exertion)    Asthma 04/22/2019   Abnormal CT of the chest 02/23/2019   Achalasia    Esophageal dysmotility    Loss of weight    Moderate protein-calorie malnutrition (HCC)    Hematemesis 04/06/2018   Periesophageal hiatal hernia 08/02/2016   Dysphagia 05/10/2016   Esophageal stricture 05/10/2016   Epigastric pain 05/10/2016   Generalized anxiety disorder 02/06/2012   Major depressive disorder, recurrent (HCC) 02/05/2012   Hypokalemia 11/09/2011   Nausea and vomiting in adult 11/08/2011   Dehydration 11/08/2011   Tremors of nervous system 11/08/2011   Hypothyroidism 05/10/2008   Dyslipidemia 05/10/2008   UNSPECIFIED ANEMIA 05/10/2008   CAROTID ARTERY DISEASE 05/10/2008   Transient cerebral ischemia 05/10/2008   Rectal bleed 05/10/2008   Chronic back pain 05/10/2008   OSTEOPENIA 05/10/2008   Dyspnea 05/10/2008   Upper airway cough syndrome 05/10/2008   ESOPHAGEAL STRICTURE 01/19/2008   Essential hypertension 04/29/2007   Gastroesophageal reflux disease 04/29/2007   Osteoarthritis 04/29/2007   SPONDYLOSIS 04/29/2007    Orientation RESPIRATION BLADDER Height & Weight     Self, Time, Situation, Place  Normal Incontinent, External catheter Weight: 157 lb 13.6 oz (71.6 kg) Height:  5\' 1"  (154.9 cm)  BEHAVIORAL SYMPTOMS/MOOD NEUROLOGICAL BOWEL NUTRITION STATUS      Continent Diet (See dc summary)  AMBULATORY STATUS COMMUNICATION OF NEEDS Skin   Limited Assist Verbally Normal                       Personal Care Assistance Level of Assistance  Feeding, Bathing, Dressing Bathing Assistance: Maximum assistance Feeding assistance: Limited assistance Dressing Assistance:  Limited assistance     Functional Limitations Info             SPECIAL CARE FACTORS FREQUENCY  PT (By licensed PT), OT (By licensed OT)     PT Frequency: 5x/week OT Frequency: 5x/week            Contractures Contractures Info: Not present     Additional Factors Info  Code Status, Allergies Code Status Info: DNR Allergies Info: Tape, Tramadol, Morphine           Current Medications (06/03/2023):  This is the current hospital active medication list Current Facility-Administered Medications  Medication Dose Route Frequency Provider Last Rate Last Admin   acetaminophen (TYLENOL) tablet 650 mg  650 mg Oral Q6H PRN Pokhrel, Laxman, MD   650 mg at 06/02/23 1103   albuterol (PROVENTIL) (2.5 MG/3ML) 0.083% nebulizer solution 2.5 mg  2.5 mg Nebulization Q6H PRN Pham, Minh Q, RPH-CPP       amiodarone (PACERONE) tablet 200 mg  200 mg Oral Daily Pokhrel, Laxman, MD   200 mg at 06/03/23 1131   buPROPion (WELLBUTRIN XL) 24 hr tablet 300 mg  300 mg Oral Daily PRN Pokhrel, Laxman, MD       cefTRIAXone (ROCEPHIN) 2 g in sodium chloride 0.9 % 100 mL IVPB  2 g Intravenous Daily Gonfa, Taye T, MD 200 mL/hr at 06/03/23 1133 2 g at 06/03/23 1133   clonazePAM (KLONOPIN) tablet 0.5 mg  0.5 mg Oral BID Elgergawy, Leana Roe, MD   0.5 mg at 06/03/23 1131   dextrose 5 %-0.9 % sodium chloride infusion   Intravenous Continuous Gonfa, Taye T, MD 60 mL/hr at 06/03/23 1325 Infusion Verify at 06/03/23 1325   hydrALAZINE (APRESOLINE) injection 5 mg  5 mg Intravenous Q6H PRN Elgergawy, Leana Roe, MD       losartan (COZAAR) tablet 50 mg  50 mg Oral Daily Elgergawy, Leana Roe, MD   50 mg at 06/03/23 1130   metoprolol succinate (TOPROL-XL) 24 hr tablet 25 mg  25 mg Oral Daily Pham, Minh Q, RPH-CPP   25 mg at 06/03/23 1131   metroNIDAZOLE (FLAGYL) IVPB 500 mg  500 mg Intravenous Q12H Candelaria Stagers T, MD 100 mL/hr at 06/03/23 1256 500 mg at 06/03/23 1256   multivitamin with minerals tablet 1 tablet  1 tablet Oral q AM Pokhrel, Laxman, MD   1 tablet at 06/03/23 0605   oxyCODONE-acetaminophen (PERCOCET/ROXICET) 5-325 MG per tablet 1 tablet  1 tablet Oral Q6H PRN Pokhrel, Laxman, MD   1 tablet at 06/03/23 1320   pantoprazole (PROTONIX) injection 40 mg  40 mg Intravenous Q12H  Candelaria Stagers T, MD   40 mg at 06/03/23 1130   polyvinyl alcohol (LIQUIFILM TEARS) 1.4 % ophthalmic solution 1 drop  1 drop Both Eyes BID Pokhrel, Laxman, MD   1 drop at 06/03/23 1131   rosuvastatin (CRESTOR) tablet 20 mg  20 mg Oral q1800 Elgergawy, Leana Roe, MD         Discharge Medications: Please see discharge summary for a list of discharge medications.  Relevant Imaging Results:  Relevant Lab Results:   Additional Information SSN [id number] [place], [place]

## 2023-06-03 NOTE — Plan of Care (Signed)

## 2023-06-03 NOTE — Progress Notes (Signed)
RE: Julie Rice Date of Birth: 09/04/1936 Date: 06/03/23  Please be advised that the above-named patient will require a short-term nursing home stay - anticipated 30 days or less for rehabilitation and strengthening.  The plan is for return home.

## 2023-06-03 NOTE — TOC Initial Note (Signed)
Transition of Care [facility]) - Initial/Assessment Note    Patient Details  Name: Julie Rice MRN: [id number] Date of Birth: Aug 20, 1936  Transition of [facility]) CM/SW Contact:    Mearl Latin, LCSW Phone Number: 06/03/2023, 2:33 PM  Clinical Narrative:                 CSW received consult for possible SNF placement at time of discharge. CSW spoke with patient. Patient reported that she lives alone. Patient expressed understanding of PT recommendation and is agreeable to SNF placement at time of discharge. Patient reports preference for [facility] as she has been there before. CSW discussed insurance authorization process and will provide Medicare SNF ratings list. CSW will send out referrals for review and provide bed offers as available.   Skilled Nursing Rehab Facilities-   [email or web address]   Ratings out of 5 stars (5 the highest)   Name Address  Phone # Quality Care Staffing Health Inspection Overall  [facility] [address] [place], [place] [phone] 2 1 5 4   [facility] [address] [place] [phone] 4 1 3 2   [facility] [address], [facility] [phone] [facility] [address] [place] [phone] 4 1 3 2   [facility]  [address], [place] [phone] 3 2 5 5   [facility] [address] [facility] [phone] 2 1 2 1   [facility] [address], [place] [phone] 4 1 2 1   [facility] [address] [place] [phone] 2 4 3 3   [address] ([facility]) [address] [place] [phone] 3 2 2 2   [facility] [address], [place] [phone] 1 2 1 1   [facility] ([facility]) [address], [place] [phone] 3 1 1 1   [facility] [address] Liliane Shi [phone] 4 3 4 4   [facility] [address] [facility] [address] [place] [phone] 3 4 3 3           [facility]  [address] [place] [phone]      [facility], [place] [address], [place] [phone] 1 1 2 1   [facility] [address] [place] [phone] 2 2 4 4   [facility] [address] [place] Ditto [phone] 2 1 4 3   [facility] [address] [place] [phone] 3 3 3 3           [address] (no [facility]) [address], [place] [phone] 4 4 5 5   [facility] (No Humana) [address] Lavera Guise [phone] 2 2 4 4   [facility] [address] [place] [phone] 2 1 2 1   [facility] (No UHC) [address] [place], [place] [phone] 5 5 5 5   [facility] [address], [facility] [phone] 2 3 5 5   [facility] [address], [place] [phone] 2 1 1 1   [facility] [address] Liliane Shi [phone] 5 2 5 5   [facility]  [address] [place] [phone] 2 2 2 2   [facility] [address], [place] [phone] 4 2 1 1   [facility] [address] [place], [place] [phone] 2 2 3 3           [facility] [address], [place] [phone] 1 1 1 1   [facility] [address], Evlyn Clines  [phone] 2 3 3 3   [facility] (No Humana) [address], [place] [phone] 2  1 3 2   [facility]) [address], [place]al[place] Levan [phone] 1 1 1 1   [facility] [address] [place]an [phone] 3 2 5 5   [facility] [address] [place], [place] [phone] 2 1 1 1           [facility] [address] [place], [place] [phone] 4 4 5 5   [facility])  [address] [place] [phone] 2 1 2 1   [facility]) [address], [place] [phone]  1 4 3   [facility] [address] [place] [phone] 3 5 4 5   [address] [place], [place] [phone] 3 2 2 2   [facility]) [address] [place] [phone] 2 1 3  2      Expected Discharge Plan: Skilled Nursing Facility Barriers to Discharge: Continued Medical Work up, English as a second language teacher, SNF Pending bed offer   Patient Goals and CMS Choice Patient states their goals for this hospitalization and ongoing recovery are:: Rehab CMS Medicare.gov Compare Post Acute Care list provided to:: Patient Choice offered to / list presented to : Patient [place] ownership interest in [facility].provided to:: Patient    Expected Discharge Plan and Services In-house Referral: Clinical Social Work   Post Acute Care Choice: Skilled Nursing Facility Living arrangements for the past 2 months: Skilled Nursing Facility                                      Prior Living Arrangements/Services Living arrangements for the past 2 months: Skilled Nursing Facility Lives with:: Self Patient language and need for interpreter reviewed:: Yes Do you feel safe going back to the place where you live?: Yes      Need for Family Participation in Patient Care: No (Comment) Care giver support system in place?: Yes (comment)   Criminal Activity/Legal Involvement Pertinent to Current Situation/Hospitalization: No - Comment as needed  Activities of Daily Living      Permission Sought/Granted Permission sought to share information with : Facility Industrial/product designer granted to share information with : Yes, Verbal Permission Granted     Permission granted to share info w AGENCY: SNFs        Emotional Assessment Appearance:: Appears stated age Attitude/Demeanor/Rapport: Engaged, Gracious Affect (typically observed): Accepting, Appropriate, Pleasant Orientation: : Oriented to Self, Oriented to Place, Oriented to  Time, Oriented to Situation Alcohol / Substance Use: Not Applicable Psych Involvement: No (comment)  Admission diagnosis:  Chronic anticoagulation [Z79.01] Acute kidney injury (nontraumatic) (HCC) [N17.9] Macrocytosis  without anemia [D75.89] AKI (acute kidney injury) (HCC) [N17.9] Elevated random blood glucose level [R73.09] Elevated alkaline phosphatase level [R74.8] Patient Active Problem List   Diagnosis Date Noted   AKI (acute kidney injury) (HCC) 06/01/2023   Lactic acidosis 06/01/2023   Syncope 06/01/2023   Generalized weakness 09/28/2022   Temporal arteritis (HCC) 09/28/2022   Arrhythmia 09/22/2022   Idiopathic peripheral neuropathy 07/31/2022   Paroxysmal SVT (supraventricular tachycardia) 05/14/2022   Coronary artery disease involving native coronary artery of native heart without angina pectoris 05/14/2022   Wide-complex tachycardia 04/03/2021   Atrial fibrillation (HCC) 10/15/2020   Chronic pain syndrome 02/16/2020   Fibromyalgia 02/16/2020   Myofascial pain 02/16/2020   Rheumatoid arthritis involving multiple sites (HCC) 02/16/2020   Pleural effusion, bilateral 09/08/2019   Secondary cardiomyopathy (HCC)    Elevated troponin  08/17/2019   Chronic respiratory failure with hypoxia (HCC) 08/16/2019   COPD (chronic obstructive pulmonary disease) (HCC) 08/16/2019   PAF (paroxysmal atrial fibrillation) (HCC) 08/16/2019   Community acquired pneumonia of left lung 08/07/2019   Prolonged QT interval 08/07/2019   Sepsis (HCC) 08/07/2019   Acute on chronic respiratory failure with hypoxia (HCC) 08/07/2019   Chronic heart failure with preserved ejection fraction (HFpEF) (HCC) 07/24/2019   Chest pain    DOE (dyspnea on exertion)    Asthma 04/22/2019   Abnormal CT of the chest 02/23/2019   Achalasia    Esophageal dysmotility    Loss of weight    Moderate protein-calorie malnutrition (HCC)    Hematemesis 04/06/2018   Periesophageal hiatal hernia 08/02/2016   Dysphagia 05/10/2016   Esophageal stricture 05/10/2016   Epigastric pain 05/10/2016   Generalized anxiety disorder 02/06/2012   Major depressive disorder, recurrent (HCC) 02/05/2012   Hypokalemia 11/09/2011   Nausea and vomiting in  adult 11/08/2011   Dehydration 11/08/2011   Tremors of nervous system 11/08/2011   Hypothyroidism 05/10/2008   Dyslipidemia 05/10/2008   UNSPECIFIED ANEMIA 05/10/2008   CAROTID ARTERY DISEASE 05/10/2008   Transient cerebral ischemia 05/10/2008   Rectal bleed 05/10/2008   Chronic back pain 05/10/2008   OSTEOPENIA 05/10/2008   Dyspnea 05/10/2008   Upper airway cough syndrome 05/10/2008   ESOPHAGEAL STRICTURE 01/19/2008   Essential hypertension 04/29/2007   Gastroesophageal reflux disease 04/29/2007   Osteoarthritis 04/29/2007   SPONDYLOSIS 04/29/2007   PCP:  Shirline Frees, NP Pharmacy:   [id number] [place], [place] - [address] [place] [address] Phone: [phone] Fax: [phone]  [facility] [address] [place] [address] Phone: [phone] Fax: [phone]  [facility] [place], [place] - [address] Laurell Josephs [address] Laurell Josephs [address] [place] [address] Phone: [phone] Fax: [phone]  [facility] [place], [place] - [address] [place] [address] Phone: [phone] Fax: [phone]     Social Determinants of Health (SDOH) Social History: SDOH Screenings   Food Insecurity: No Food Insecurity (12/08/2022)  Housing: Low Risk  (12/08/2022)  Transportation Needs: No Transportation Needs (12/08/2022)  Utilities: Not At Risk (12/08/2022)  Alcohol Screen: Low Risk  (02/14/2022)  Depression (PHQ2-9): Medium Risk (04/01/2023)  Financial Resource Strain: Low Risk  (02/14/2022)  Physical Activity: Inactive (02/14/2022)  Social Connections: Moderately Integrated (02/14/2022)  Stress: No Stress Concern Present (02/14/2022)  Tobacco Use: Low Risk  (06/01/2023)   SDOH Interventions:     Readmission Risk Interventions     No data to display

## 2023-06-03 NOTE — Progress Notes (Signed)
PROGRESS NOTE    Julie Rice  VWU:[id number] DOB: 03/20/1936 DOA: 06/01/2023 PCP: Shirline Frees, NP    Brief Narrative:   Julie Rice is a 87 y.o. female with PMH of CAD, HFpEF, PAF/SVT on Eliquis, COPD/chronic hypoxic RF, esophagitis with stricture status post dilation was brought to the hospital by EMS after she was unable to get out of toilet seat unsure about loss of consciousness.  Per EMS, patient was hypotensive with BP down to 70s/50s that  improved after IV fluid.  Patient also reported numbness and tingling in her arms or legs, constipation. On Eliquis for atrial fibrillation. Patient reports ambulating with rollator and has had frequent falls due to losing balance. In ED, vitals were stable.  Creatinine was elevated at 1.9   Lactic acid 4.0 but improved to 1.2.  WBC 25.5 with left shift.  Hgb 13.5>> 12.7.  EKG with new LBBB.  CT head and CXR without acute finding.  Patient had a large dark red and loose bowel movements while in ED. Hemoccult was positive.  Patient was then started on IV PPI and was then admitted hospital for further evaluation and treatment.  Assessment and plan.  Severe sepsis, present on admission, due to infectious colitis  Presyncope/hypotension Infectious colitis -Sepsis present on admission.hypertension, leukocytosis, elevated lactic acid  -On broad-spectrum antibiotic coverage linezolid, Rocephin and Flagyl, will DC linezolid, continue with Rocephin and Flagyl for intra-abdominal coverage -Blood pressure has stabilized -Leukocytosis trending down -Her colitis most likely related to infectious etiology, less likely to be ischemic given age and transverse colon, , but possible in the setting of hypotension on presentation (reports she has been compliant with Eliquis)  Bright red blood per rectum Acute blood loss anemia -Patient was noted to have loose stool, with red blood while in ED, she has not noted this before This is most likely in the setting  of colitis, she is Hemoccult positive -Eliquis has been held for now, hemoglobin with initial drop, most likely in the setting of aggressive IV hydration, and low intensity GI bleed from her colitis -Eliquis remains on hold, will discuss with GI when appropriate to resume  AKI:  Due to above, improved with hydration. Avoid nephrotoxic medications     Chronic COPD/chronic hypoxic respiratory failure. Continue inhalers from home.   Chronic HFpEF: Has trace edema.  Otherwise stable.  Strict intake and output charting, daily weights.  Hold Lasix for now.    History of CAD/new LBBB:  -No chest pain but new LBBB on EKG. troponins negative.   -2D echo reassuring with a preserved EF and no regional wall motion abnormalities    Paroxysmal A-fib:  -Currently in sinus rhythm.  On amiodarone, metoprolol and Eliquis at home.  -Continue with amiodarone  -Blood pressure improved, continue with metoprolol  -Resume Eliquis once cleared by GI    DVT prophylaxis: SCDs Start: 06/01/23 0951 SCDs Start: 06/01/23 0948   Code Status:     Code Status: DNR  Disposition: Uncertain at this time.  Will get PT OT evaluation.  Status is: Inpatient Remains inpatient appropriate because: Colitis, AKI, GI bleed   Family Communication: Spoke with the patient's family at bedside.  Consultants:  GI  Procedures:  None yet  Antimicrobials:  Rocephin, linezolid, metronidazole  Anti-infectives (From admission, onward)    Start     Dose/Rate Route Frequency Ordered Stop   06/01/23 1045  linezolid (ZYVOX) IVPB 600 mg        600 mg 300 mL/hr over  60 Minutes Intravenous Every 12 hours 06/01/23 1044     06/01/23 1015  cefTRIAXone (ROCEPHIN) 2 g in sodium chloride 0.9 % 100 mL IVPB        2 g 200 mL/hr over 30 Minutes Intravenous Daily 06/01/23 1011     06/01/23 1015  metroNIDAZOLE (FLAGYL) IVPB 500 mg        500 mg 100 mL/hr over 60 Minutes Intravenous Every 12 hours 06/01/23 1011           Subjective:  She does report poor night sleep due to abdominal pain and nausea.   Objective: Vitals:   06/02/23 1400 06/02/23 1633 06/02/23 2025 06/03/23 0500  BP: 132/68 (!) 150/66 (!) 139/56   Pulse: 82 80 78   Resp: 15 18 18    Temp:  98 F (36.7 C) 97.9 F (36.6 C)   TempSrc:  Oral Oral   SpO2: 97% 94% 96%   Weight:    71.6 kg  Height:        Intake/Output Summary (Last 24 hours) at 06/03/2023 0907 Last data filed at 06/03/2023 0400 Gross per 24 hour  Intake 423.21 ml  Output 100 ml  Net 323.21 ml   Filed Weights   06/01/23 0145 06/02/23 1206 06/03/23 0500  Weight: 68 kg 68.3 kg 71.6 kg    Physical Examination: Body mass index is 29.83 kg/m.   Awake Alert, Oriented X 3, No new F.N deficits, Normal affect Symmetrical Chest wall movement, Good air movement bilaterally, CTAB RRR,No Gallops,Rubs or new Murmurs, No Parasternal Heave +ve B.Sounds, Abd Soft, some tenderness and mid abdominal upper abdominal area No Cyanosis, Clubbing or edema, No new Rash or bruise     Data Reviewed:   CBC: Recent Labs  Lab 06/01/23 0154 06/01/23 0541 06/01/23 0815 06/01/23 2312 06/02/23 0548 06/02/23 0550 06/03/23 0147  WBC 25.5*  --   --   --  17.3*  --  13.7*  NEUTROABS 23.2*  --   --   --   --   --   --   HGB 13.5   < > 12.0 10.5* 10.5* 10.4* 9.8*  HCT 42.3   < > 37.8 34.0* 33.0* 32.8* 30.2*  MCV 101.2*  --   --   --  98.5  --  97.1  PLT 267  --   --   --  225  --  189   < > = values in this interval not displayed.    Basic Metabolic Panel: Recent Labs  Lab 06/01/23 0154 06/02/23 0548 06/02/23 0550 06/03/23 0147  NA 138 138  --  141  K 4.6 4.2  --  3.9  CL 105 105  --  108  CO2 19* 26  --  25  GLUCOSE 163* 120*  --  112*  BUN 50* 38*  --  21  CREATININE 1.92* 1.14*  --  0.87  CALCIUM 8.2* 8.3*  --  8.2*  MG  --   --  2.4 2.2    Liver Function Tests: Recent Labs  Lab 06/01/23 0154  AST 31  ALT 25  ALKPHOS 179*  BILITOT 1.1  PROT 6.2*   ALBUMIN 3.4*     Radiology Studies: ECHOCARDIOGRAM COMPLETE  Result Date: 06/02/2023    ECHOCARDIOGRAM REPORT   Patient Name:   Julie Rice Date of Exam: 06/02/2023 Medical Rec #:  [id number]        Height:       61.0 in Accession #:  [id number]       Weight:       150.0 lb Date of Birth:  April 10, 1936         BSA:          1.671 m Patient Age:    87 years         BP:           130/59 mmHg Patient Gender: F                HR:           85 bpm. Exam Location:  Inpatient Procedure: 2D Echo, Cardiac Doppler and Color Doppler Indications:    Other abnormalities of the heart R00.8                 CAD  History:        Patient has prior history of Echocardiogram examinations, most                 recent 12/08/2022. CAD, Carotid Disease and COPD; Risk                 Factors:Non-Smoker and Dyslipidemia.  Sonographer:    Dondra Prader RVT RCS Referring Phys: Boyce Medici GONFA IMPRESSIONS  1. Left ventricular ejection fraction, by estimation, is 55 to 60%. The left ventricle has normal function. The left ventricle has no regional wall motion abnormalities. There is mild concentric left ventricular hypertrophy. Left ventricular diastolic parameters are consistent with Grade I diastolic dysfunction (impaired relaxation).  2. Right ventricular systolic function is normal. The right ventricular size is mildly enlarged. There is mildly elevated pulmonary artery systolic pressure. The estimated right ventricular systolic pressure is 44.5 mmHg.  3. Left atrial size was moderately dilated.  4. Right atrial size was moderately dilated.  5. The mitral valve is normal in structure. Mild mitral valve regurgitation. No evidence of mitral stenosis. Moderate mitral annular calcification.  6. The tricuspid valve is abnormal. Tricuspid valve regurgitation is moderate.  7. The aortic valve is tricuspid. There is mild calcification of the aortic valve. Aortic valve regurgitation is mild. No aortic stenosis is present.  8. The inferior vena  cava is normal in size with greater than 50% respiratory variability, suggesting right atrial pressure of 3 mmHg. FINDINGS  Left Ventricle: Left ventricular ejection fraction, by estimation, is 55 to 60%. The left ventricle has normal function. The left ventricle has no regional wall motion abnormalities. The left ventricular internal cavity size was normal in size. There is  mild concentric left ventricular hypertrophy. Left ventricular diastolic parameters are consistent with Grade I diastolic dysfunction (impaired relaxation). Right Ventricle: The right ventricular size is mildly enlarged. No increase in right ventricular wall thickness. Right ventricular systolic function is normal. There is mildly elevated pulmonary artery systolic pressure. The tricuspid regurgitant velocity is 3.22 m/s, and with an assumed right atrial pressure of 3 mmHg, the estimated right ventricular systolic pressure is 44.5 mmHg. Left Atrium: Left atrial size was moderately dilated. Right Atrium: Right atrial size was moderately dilated. Pericardium: There is no evidence of pericardial effusion. Mitral Valve: The mitral valve is normal in structure. There is mild calcification of the mitral valve leaflet(s). Moderate mitral annular calcification. Mild mitral valve regurgitation. No evidence of mitral valve stenosis. Tricuspid Valve: The tricuspid valve is abnormal. Tricuspid valve regurgitation is moderate. Aortic Valve: The aortic valve is tricuspid. There is mild calcification of the aortic valve. Aortic valve regurgitation is mild. Aortic regurgitation PHT measures  624 msec. No aortic stenosis is present. Aortic valve mean gradient measures 5.0 mmHg. Aortic valve peak gradient measures 9.5 mmHg. Aortic valve area, by VTI measures 2.09 cm. Pulmonic Valve: The pulmonic valve was normal in structure. Pulmonic valve regurgitation is not visualized. Aorta: The aortic root is normal in size and structure. Venous: The inferior vena cava is  normal in size with greater than 50% respiratory variability, suggesting right atrial pressure of 3 mmHg. IAS/Shunts: No atrial level shunt detected by color flow Doppler.  LEFT VENTRICLE PLAX 2D LVIDd:         3.85 cm   Diastology LVIDs:         2.35 cm   LV e' medial:    6.00 cm/s LV PW:         1.15 cm   LV E/e' medial:  12.1 LV IVS:        1.25 cm   LV e' lateral:   4.60 cm/s LVOT diam:     1.70 cm   LV E/e' lateral: 15.8 LV SV:         57 LV SV Index:   34 LVOT Area:     2.27 cm  RIGHT VENTRICLE             IVC RV Basal diam:  5.00 cm     IVC diam: 1.20 cm RV S prime:     19.30 cm/s TAPSE (M-mode): 2.9 cm LEFT ATRIUM             Index        RIGHT ATRIUM           Index LA diam:        3.70 cm 2.21 cm/m   RA Area:     22.50 cm LA Vol (A2C):   82.0 ml 49.06 ml/m  RA Volume:   56.30 ml  33.68 ml/m LA Vol (A4C):   52.9 ml 31.65 ml/m LA Biplane Vol: 67.0 ml 40.09 ml/m  AORTIC VALVE                     PULMONIC VALVE AV Area (Vmax):    2.08 cm      PV Vmax:          1.12 m/s AV Area (Vmean):   2.12 cm      PV Peak grad:     5.0 mmHg AV Area (VTI):     2.09 cm      PR End Diast Vel: 3.88 msec AV Vmax:           154.00 cm/s AV Vmean:          103.000 cm/s AV VTI:            0.270 m AV Peak Grad:      9.5 mmHg AV Mean Grad:      5.0 mmHg LVOT Vmax:         141.00 cm/s LVOT Vmean:        96.200 cm/s LVOT VTI:          0.249 m LVOT/AV VTI ratio: 0.92 AI PHT:            624 msec AR Vena Contracta: 0.55 cm  AORTA Ao Root diam: 3.10 cm Ao Asc diam:  3.60 cm MITRAL VALVE                TRICUSPID VALVE MV Area (PHT): 3.99 cm     TR Peak grad:  41.5 mmHg MV Decel Time: 190 msec     TR Mean grad:   27.0 mmHg MV E velocity: 72.70 cm/s   TR Vmax:        322.00 cm/s MV A velocity: 132.00 cm/s  TR Vmean:       251.0 cm/s MV E/A ratio:  0.55                             SHUNTS                             Systemic VTI:  0.25 m                             Systemic Diam: 1.70 cm Dalton McleanMD Electronically signed by  Wilfred Lacy Signature Date/Time: 06/02/2023/10:26:17 AM    Final    CT CHEST ABDOMEN PELVIS WO CONTRAST  Result Date: 06/01/2023 CLINICAL DATA:  87 year old female with history of sepsis. EXAM: CT CHEST, ABDOMEN AND PELVIS WITHOUT CONTRAST TECHNIQUE: Multidetector CT imaging of the chest, abdomen and pelvis was performed following the standard protocol without IV contrast. RADIATION DOSE REDUCTION: This exam was performed according to the departmental dose-optimization program which includes automated exposure control, adjustment of the mA and/or kV according to patient size and/or use of iterative reconstruction technique. COMPARISON:  CT of the abdomen and pelvis 09/23/2022. CT of the chest, abdomen and pelvis 08/17/2019. FINDINGS: CT CHEST FINDINGS Cardiovascular: Heart size is normal. There is no significant pericardial fluid, thickening or pericardial calcification. There is aortic atherosclerosis, as well as atherosclerosis of the great vessels of the mediastinum and the coronary arteries, including calcified atherosclerotic plaque in the left anterior descending and right coronary arteries. Severe calcifications of the mitral annulus. Mediastinum/Nodes: No pathologically enlarged mediastinal or hilar lymph nodes. Please note that accurate exclusion of hilar adenopathy is limited on noncontrast CT scans. Moderate to large hiatal hernia. No axillary lymphadenopathy. Lungs/Pleura: Numerous small calcified granulomas. Linear area of architectural distortion in the periphery of the right upper lobe, similar to the prior study, most compatible with an area of chronic post infectious or inflammatory scarring (axial image 62 of series 5). Multiple small subcentimeter ground-glass attenuation nodules scattered throughout the lungs bilaterally, many of which appear similar to the prior study in retrospect. No other larger more suspicious appearing pulmonary nodules or masses are noted. No acute consolidative  airspace disease. No pleural effusions. Musculoskeletal: There are no aggressive appearing lytic or blastic lesions noted in the visualized portions of the skeleton. CT ABDOMEN PELVIS FINDINGS Hepatobiliary: No definite suspicious cystic or solid hepatic lesions are confidently identified on today's noncontrast CT examination. Unenhanced appearance of the gallbladder is unremarkable. Pancreas: No definite pancreatic mass or peripancreatic fluid collections are noted on today's noncontrast CT examination. Spleen: Unremarkable. Adrenals/Urinary Tract: 6 mm high attenuation lesion in the anterior aspect of the interpolar region of the right kidney (axial image 74 of series 3), incompletely characterized on today's noncontrast CT examination, but statistically likely a small proteinaceous/hemorrhagic cyst. Unenhanced appearance of the left kidney and bilateral adrenal glands is otherwise unremarkable in appearance. No hydroureteronephrosis. Urinary bladder is unremarkable in appearance. Stomach/Bowel: The appearance of the stomach is unremarkable. No pathologic dilatation of small bowel or colon. Extensive mural thickening is noted throughout the transverse colon where there are surrounding inflammatory changes, concerning for acute colitis.  Several colonic diverticuli are also noted, predominantly in the distal descending colon, without definite focal surrounding inflammatory changes to indicate an acute diverticulitis at this time. The appendix is not confidently identified and may be surgically absent. Regardless, there are no inflammatory changes noted adjacent to the cecum to suggest the presence of an acute appendicitis at this time. Vascular/Lymphatic: Atherosclerosis in the abdominal aorta and pelvic vasculature. No lymphadenopathy noted in the abdomen or pelvis. Reproductive: Status post hysterectomy. Ovaries are not confidently identified may be surgically absent or atrophic. Other: No significant volume of  ascites.  No pneumoperitoneum. Musculoskeletal: Rod and screw fixation hardware throughout the lumbar spine. There are no aggressive appearing lytic or blastic lesions noted in the visualized portions of the skeleton. IMPRESSION: 1. Extensive mural thickening and surrounding inflammatory changes associated with the transverse colon, concerning for acute colitis. 2. No acute findings are noted in the thorax to account for the patient's symptoms. 3. Mild colonic diverticulosis without definite findings to suggest acute diverticulitis at this time. 4. Indeterminate 6 mm high attenuation lesion in the right kidney, strongly favored to represent a proteinaceous/hemorrhagic cysts, but not characterized on today's noncontrast CT examination. This could be definitively characterized with nonemergent abdominal MRI with and without IV gadolinium if of clinical concern. 5. Moderate to large hiatal hernia. 6. Aortic atherosclerosis, in addition to left anterior descending and right coronary artery disease. 7. There are calcifications of the mitral annulus. Echocardiographic correlation for evaluation of potential valvular dysfunction may be warranted if clinically indicated. Electronically Signed   By: Trudie Reed M.D.   On: 06/01/2023 11:15      LOS: 2 days    Huey Bienenstock, MD [facility] Available via Epic secure chat 7am-7pm After these hours, please refer to coverage provider listed on [email or web address] 06/03/2023, 9:07 AM

## 2023-06-04 ENCOUNTER — Encounter (HOSPITAL_COMMUNITY)
Admission: EM | Disposition: A | Discharge status: Skilled Nursing Facility | Payer: Self-pay | Source: Home / Self Care | Attending: Internal Medicine

## 2023-06-04 DIAGNOSIS — I5032 Chronic diastolic (congestive) heart failure: Secondary | ICD-10-CM | POA: Diagnosis not present

## 2023-06-04 DIAGNOSIS — I4891 Unspecified atrial fibrillation: Secondary | ICD-10-CM | POA: Diagnosis not present

## 2023-06-04 DIAGNOSIS — K921 Melena: Secondary | ICD-10-CM | POA: Diagnosis not present

## 2023-06-04 DIAGNOSIS — J9611 Chronic respiratory failure with hypoxia: Secondary | ICD-10-CM | POA: Diagnosis not present

## 2023-06-04 DIAGNOSIS — J449 Chronic obstructive pulmonary disease, unspecified: Secondary | ICD-10-CM | POA: Diagnosis not present

## 2023-06-04 DIAGNOSIS — R109 Unspecified abdominal pain: Secondary | ICD-10-CM | POA: Diagnosis not present

## 2023-06-04 DIAGNOSIS — Z7901 Long term (current) use of anticoagulants: Secondary | ICD-10-CM

## 2023-06-04 DIAGNOSIS — N179 Acute kidney failure, unspecified: Secondary | ICD-10-CM | POA: Diagnosis not present

## 2023-06-04 DIAGNOSIS — K529 Noninfective gastroenteritis and colitis, unspecified: Secondary | ICD-10-CM | POA: Diagnosis not present

## 2023-06-04 SURGERY — ESOPHAGOGASTRODUODENOSCOPY (EGD) WITH PROPOFOL
Anesthesia: Monitor Anesthesia Care

## 2023-06-04 MED ORDER — ONDANSETRON 4 MG PO TBDP
4.0000 mg | ORAL_TABLET | Freq: Once | ORAL | Status: AC
  Administered 2023-06-04: 4 mg via ORAL

## 2023-06-04 MED ORDER — FUROSEMIDE 40 MG PO TABS
40.0000 mg | ORAL_TABLET | Freq: Every day | ORAL | Status: DC
  Administered 2023-06-04 – 2023-06-06 (×3): 40 mg via ORAL
  Filled 2023-06-04 (×3): qty 1

## 2023-06-04 MED ORDER — ONDANSETRON 4 MG PO TBDP
ORAL_TABLET | ORAL | Status: AC
  Filled 2023-06-04: qty 1

## 2023-06-04 NOTE — Progress Notes (Signed)
    Gastroenterology Inpatient Follow Up    Subjective: Patient has continued to have some nausea and vomiting.  Thus she was not able to tolerate doing a bowel prep last night  Objective: Vital signs in last 24 hours: Temp:  [97.7 F (36.5 C)-98 F (36.7 C)] 98 F (36.7 C) (07/31 0806) Pulse Rate:  [67-73] 72 (07/31 0806) Resp:  [15-22] 22 (07/31 1000) BP: (137-172)/(54-89) 166/76 (07/31 0806) SpO2:  [92 %-97 %] 97 % (07/31 0806) Last BM Date : 06/01/23  Intake/Output from previous day: 07/30 0701 - 07/31 0700 In: 2770 [P.O.:600; I.V.:2170] Out: 850 [Urine:850] Intake/Output this shift: Total I/O In: 320 [P.O.:320] Out: -   General appearance: alert and cooperative Resp: no increased WOB Cardio: regular rate GI: tender in the epigastric area  Lab Results: Recent Labs    06/02/23 0548 06/02/23 0550 06/03/23 0147 06/04/23 0117  WBC 17.3*  --  13.7* 10.3  HGB 10.5* 10.4* 9.8* 10.0*  HCT 33.0* 32.8* 30.2* 31.1*  PLT 225  --  189 198   BMET Recent Labs    06/02/23 0548 06/03/23 0147 06/04/23 0117  NA 138 141 140  K 4.2 3.9 3.5  CL 105 108 108  CO2 26 25 24   GLUCOSE 120* 112* 101*  BUN 38* 21 10  CREATININE 1.14* 0.87 0.64  CALCIUM 8.3* 8.2* 8.0*   LFT No results for input(s): "PROT", "ALBUMIN", "AST", "ALT", "ALKPHOS", "BILITOT", "BILIDIR", "IBILI" in the last 72 hours. PT/INR Recent Labs    06/02/23 0550  LABPROT 16.7*  INR 1.3*   Hepatitis Panel No results for input(s): "HEPBSAG", "HCVAB", "HEPAIGM", "HEPBIGM" in the last 72 hours. C-Diff No results for input(s): "CDIFFTOX" in the last 72 hours.  Studies/Results: No results found.  Medications: I have reviewed the patient's current medications. Scheduled:  amiodarone  200 mg Oral Daily   clonazePAM  0.5 mg Oral BID   furosemide  40 mg Oral Daily   losartan  50 mg Oral Daily   metoprolol succinate  25 mg Oral Daily   multivitamin with minerals  1 tablet Oral q AM   pantoprazole  (PROTONIX) IV  40 mg Intravenous Q12H   peg 3350 powder  0.5 kit Oral Once   And   peg 3350 powder  0.5 kit Oral Once   polyvinyl alcohol  1 drop Both Eyes BID   rosuvastatin  20 mg Oral q1800   Continuous:  cefTRIAXone (ROCEPHIN)  IV 2 g (06/04/23 1125)   metronidazole 500 mg (06/04/23 1214)   FAO:ZHYQMVHQIONGE, albuterol, buPROPion, hydrALAZINE, HYDROmorphone (DILAUDID) injection, oxyCODONE-acetaminophen, prochlorperazine  Assessment/Plan: 87 year old female with history of CAD, HFpEF, A-fib on Eliquis, COPD/chronic respiratory failure, esophageal stricture presented with hypotension. We are consulted for hematochezia. Also endorsed some melena and dysphagia. She states that her last EGD 02/2023 with Botox and dilation did help, but her dysphagia has returned. Hemoglobin dropped from 13.5 to 9.8 and has now stabilized. CT A/P shows inflammation in the transverse colon. Her last colonoscopy in 2018 showed only internal hemorrhoids. Last dose of Eliquis was 7/27.  -Will cancel her EGD and colonoscopy for now.  Can wait until the patient is feeling better before considering endoscopic procedures.   -Will check GI path panel and C. difficile -Continue supportive care. -Can likely stop antibiotics tomorrow   LOS: 3 days   Imogene Burn 06/04/2023, 2:51 PM

## 2023-06-04 NOTE — Plan of Care (Signed)

## 2023-06-04 NOTE — Care Management Important Message (Signed)
Important Message  Patient Details  Name: Julie Rice MRN: [id number] Date of Birth: [birth year]   Medicare Important Message Given:  Yes     Khalid Lacko Stefan Church 06/04/2023, 2:41 PM

## 2023-06-04 NOTE — Progress Notes (Signed)
Pt refused to take her bowel prep. She said that she is not against colonoscopy but not by tomorrow at she felt sick and throwing up most of the time. Pt educated. Perrin Maltese, MD made aware

## 2023-06-04 NOTE — Progress Notes (Signed)
PROGRESS NOTE        PATIENT DETAILS Name: Julie Rice Age: 87 y.o. Sex: female Date of Birth: 05-03-1936 Admit Date: 06/01/2023 Admitting Physician Briscoe Deutscher, MD [id number], [name], NP  Brief Summary: Patient is a 87 y.o.  female history of PAF on Eliquis, CAD, chronic HFpEF, COPD with chronic hypoxic respiratory failure on home O2, who presented with lower abdominal pain, weakness, possible syncope-apparently was stuck in her toilet for couple of hours-she was found to be hypotensive-CT imaging showed transverse colitis-patient was then admitted to the hospitalist service.  See below for further details.   Significant events: 7/28>> admit to [facility]  Significant studies: 7/28>> CXR: No PNA 7/28>> CT head: No acute intracranial abnormality 7/28>> CT chest/abdomen/pelvis: Transverse colitis 7/29>> echo: EF 55-60%, no regional wall motion abnormality.  Significant microbiology data: 7/28>> blood culture: No growth  Procedures: None  Consults: None  Subjective: Lying comfortably in bed-denies any chest pain or shortness of breath.  Claims she was nauseous and unable to drink the colon prep  Objective: Vitals: Blood pressure (!) 166/76, pulse 72, temperature 98 F (36.7 C), temperature source Oral, resp. rate (!) 22, height 5\' 1"  (1.549 m), weight 71.6 kg, SpO2 97%.   Exam: Gen Exam:Alert awake-not in any distress HEENT:atraumatic, normocephalic Chest: B/L clear to auscultation anteriorly CVS:S1S2 regular Abdomen:soft non tender, non distended Extremities:no edema Neurology: Non focal Skin: no rash  Pertinent Labs/Radiology:    Latest Ref Rng & Units 06/04/2023    1:17 AM 06/03/2023    1:47 AM 06/02/2023    5:50 AM  CBC  WBC 4.0 - 10.5 K/uL 10.3  13.7    Hemoglobin 12.0 - 15.0 g/dL 63.8  9.8  75.6   Hematocrit 36.0 - 46.0 % 31.1  30.2  32.8   Platelets 150 - 400 K/uL 198  189      Lab Results  Component Value Date   NA 140  06/04/2023   K 3.5 06/04/2023   CL 108 06/04/2023   CO2 24 06/04/2023      Assessment/Plan: Severe sepsis due to infectious colitis Sepsis physiology has resolved-leukocytosis better Continue Rocephin/Flagyl-with plans to transition to oral antibiotic in the next several days  Presyncope/weakness Secondary to hypotension Overall improved Mobilize with PT/OT  Hematochezia Mild acute blood loss anemia Eliquis remains on hold-no further hematochezia noted-GI following with tentative plans for endoscopy/colonoscopy.  However patient unable to tolerate colonoscopy prep due to nausea. Await further recommendations from gastroenterology  AKI Hemodynamically mediated in the setting of hypotension/sepsis Improved Follow electrolytes periodically  COPD with chronic hypoxic respiratory failure on home O2 Stable Continue bronchodilators  Chronic HFpEF Stable-has developed some lower extremity edema Resume Lasix  PAF Sinus rhythm Amiodarone/metoprolol Resume Eliquis when cleared by GI  History of CAD/new LBBB Troponins negative Echo stable Continue metoprolol/statin  HTN BP stable Losartan/metoprolol  BMI: Estimated body mass index is 29.83 kg/m as calculated from the following:   Height as of this encounter: 5\' 1"  (1.549 m).   Weight as of this encounter: 71.6 kg.   Code status:   Code Status: DNR   DVT Prophylaxis: SCDs Start: 06/01/23 0951 SCDs Start: 06/01/23 0948    Family Communication: None at bedside   Disposition Plan: Status is: Inpatient Remains inpatient appropriate because: Severity of illness   Planned Discharge Destination:Skilled nursing facility   Diet: Diet  Order             Diet clear liquid Fluid consistency: Thin  Diet effective now                     Antimicrobial agents: Anti-infectives (From admission, onward)    Start     Dose/Rate Route Frequency Ordered Stop   06/01/23 1045  linezolid (ZYVOX) IVPB 600 mg   Status:  Discontinued        600 mg 300 mL/hr over 60 Minutes Intravenous Every 12 hours 06/01/23 1044 06/03/23 0917   06/01/23 1015  cefTRIAXone (ROCEPHIN) 2 g in sodium chloride 0.9 % 100 mL IVPB        2 g 200 mL/hr over 30 Minutes Intravenous Daily 06/01/23 1011     06/01/23 1015  metroNIDAZOLE (FLAGYL) IVPB 500 mg        500 mg 100 mL/hr over 60 Minutes Intravenous Every 12 hours 06/01/23 1011          MEDICATIONS: Scheduled Meds:  amiodarone  200 mg Oral Daily   clonazePAM  0.5 mg Oral BID   losartan  50 mg Oral Daily   metoprolol succinate  25 mg Oral Daily   multivitamin with minerals  1 tablet Oral q AM   pantoprazole (PROTONIX) IV  40 mg Intravenous Q12H   peg 3350 powder  0.5 kit Oral Once   And   peg 3350 powder  0.5 kit Oral Once   polyvinyl alcohol  1 drop Both Eyes BID   rosuvastatin  20 mg Oral q1800   Continuous Infusions:  cefTRIAXone (ROCEPHIN)  IV 2 g (06/04/23 1125)   dextrose 5 % and 0.9 % NaCl 60 mL/hr at 06/03/23 2054   metronidazole 500 mg (06/03/23 2059)   PRN Meds:.acetaminophen, albuterol, buPROPion, hydrALAZINE, HYDROmorphone (DILAUDID) injection, oxyCODONE-acetaminophen, prochlorperazine   I have personally reviewed following labs and imaging studies  LABORATORY DATA: CBC: Recent Labs  Lab 06/01/23 0154 06/01/23 0541 06/01/23 2312 06/02/23 0548 06/02/23 0550 06/03/23 0147 06/04/23 0117  WBC 25.5*  --   --  17.3*  --  13.7* 10.3  NEUTROABS 23.2*  --   --   --   --   --   --   HGB 13.5   < > 10.5* 10.5* 10.4* 9.8* 10.0*  HCT 42.3   < > 34.0* 33.0* 32.8* 30.2* 31.1*  MCV 101.2*  --   --  98.5  --  97.1 100.6*  PLT 267  --   --  225  --  189 198   < > = values in this interval not displayed.    Basic Metabolic Panel: Recent Labs  Lab 06/01/23 0154 06/02/23 0548 06/02/23 0550 06/03/23 0147 06/04/23 0117  NA 138 138  --  141 140  K 4.6 4.2  --  3.9 3.5  CL 105 105  --  108 108  CO2 19* 26  --  25 24  GLUCOSE 163* 120*  --   112* 101*  BUN 50* 38*  --  21 10  CREATININE 1.92* 1.14*  --  0.87 0.64  CALCIUM 8.2* 8.3*  --  8.2* 8.0*  MG  --   --  2.4 2.2  --     GFR: Estimated Creatinine Clearance: 46.5 mL/min (by C-G formula based on SCr of 0.64 mg/dL).  Liver Function Tests: Recent Labs  Lab 06/01/23 0154  AST 31  ALT 25  ALKPHOS 179*  BILITOT 1.1  PROT  6.2*  ALBUMIN 3.4*   No results for input(s): "LIPASE", "AMYLASE" in the last 168 hours. No results for input(s): "AMMONIA" in the last 168 hours.  Coagulation Profile: Recent Labs  Lab 06/02/23 0550  INR 1.3*    Cardiac Enzymes: Recent Labs  Lab 06/01/23 0154  CKTOTAL 139    BNP (last 3 results) Recent Labs    02/21/23 1412 03/10/23 1234 03/21/23 1336  PROBNP 2,543* 2,500* 1,817*    Lipid Profile: No results for input(s): "CHOL", "HDL", "LDLCALC", "TRIG", "CHOLHDL", "LDLDIRECT" in the last 72 hours.  Thyroid Function Tests: No results for input(s): "TSH", "T4TOTAL", "FREET4", "T3FREE", "THYROIDAB" in the last 72 hours.  Anemia Panel: No results for input(s): "VITAMINB12", "FOLATE", "FERRITIN", "TIBC", "IRON", "RETICCTPCT" in the last 72 hours.  Urine analysis:    Component Value Date/Time   COLORURINE YELLOW 06/02/2023 1500   APPEARANCEUR CLEAR 06/02/2023 1500   LABSPEC 1.015 06/02/2023 1500   PHURINE 5.0 06/02/2023 1500   GLUCOSEU NEGATIVE 06/02/2023 1500   GLUCOSEU NEGATIVE 12/13/2022 1115   HGBUR NEGATIVE 06/02/2023 1500   HGBUR negative 07/19/2009 1509   BILIRUBINUR NEGATIVE 06/02/2023 1500   BILIRUBINUR neg 04/12/2020 1056   KETONESUR NEGATIVE 06/02/2023 1500   PROTEINUR NEGATIVE 06/02/2023 1500   UROBILINOGEN 0.2 12/13/2022 1115   NITRITE NEGATIVE 06/02/2023 1500   LEUKOCYTESUR TRACE (A) 06/02/2023 1500    Sepsis Labs: Lactic Acid, Venous    Component Value Date/Time   LATICACIDVEN 1.2 06/01/2023 0609    MICROBIOLOGY: Recent Results (from the past 240 hour(s))  Culture, blood (Routine X 2) w Reflex  to ID Panel     Status: None (Preliminary result)   Collection Time: 06/01/23 10:09 AM   Specimen: BLOOD RIGHT HAND  Result Value Ref Range Status   Specimen Description BLOOD RIGHT HAND  Final   Special Requests NONE  Final   Culture   Final    NO GROWTH 3 DAYS Performed at [facility], [address] [place], [place] [address]    Report Status PENDING  Incomplete  Culture, blood (Routine X 2) w Reflex to ID Panel     Status: None (Preliminary result)   Collection Time: 06/01/23 10:14 AM   Specimen: BLOOD RIGHT HAND  Result Value Ref Range Status   Specimen Description BLOOD RIGHT HAND  Final   Special Requests   Final    BOTTLES DRAWN AEROBIC AND ANAEROBIC Blood Culture results may not be optimal due to an inadequate volume of blood received in culture bottles   Culture   Final    NO GROWTH 3 DAYS Performed at [facility], [address], [place], [place] [address]    Report Status PENDING  Incomplete  MRSA Next Gen by PCR, Nasal     Status: None   Collection Time: 06/01/23 10:45 AM   Specimen: Nasal Swab  Result Value Ref Range Status   MRSA by PCR Next Gen NOT DETECTED NOT DETECTED Final    Comment: (NOTE) The GeneXpert MRSA Assay (FDA approved for NASAL specimens only), is one component of a comprehensive MRSA colonization surveillance program. It is not intended to diagnose MRSA infection nor to guide or monitor treatment for MRSA infections. Test performance is not FDA approved in patients less than 12 years old. Performed at [facility], [address] [place], [place] [address]     RADIOLOGY STUDIES/RESULTS: No results found.   LOS: 3 days   Jeoffrey Massed, MD  [facility]  To contact the attending provider between 7A-7P or the covering provider during after hours 7P-7A, please log into the web site [email or web address] and access using universal Ava password for that web site. If you do not have the password, please call the  hospital operator.  06/04/2023, 12:04 PM

## 2023-06-04 NOTE — Progress Notes (Signed)
Occupational Therapy Treatment Patient Details Name: Julie Rice MRN: [id number] DOB: [birth year] Today's Date: 06/04/2023   History of present illness The pt is an 87 yo female presenting 7/28 after getting stuck on toilet for ~5 hours, pt hypotensive upon arrival of EMS. Pt found to have AKI, new LBBB, possible colitis, CT negative for acute abnormality. PMH includes: CAD, HFpEF, PAF/SVT on Eliquis, COPD, anxiety, depression, bipolar disorder, fibromyalgia, HTN, TIA, urinary incontinence, and esophagitis with stricture status post dilation.   OT comments  Pt completed functional transfers with use of the RW for support and min assist to and from the toilet.  She was able to complete grooming tasks in standing at the sink with min guard assist.  Oxygen sats stable greater than 90% on room air with activity.  Feel pt is making steady progress with OT at this time.  Will benefit from continued acute care OT to progress to a greater level of independence with selfcare tasks.  Patient will benefit from continued inpatient follow up therapy, <3 hours/day.    Recommendations for follow up therapy are one component of a multi-disciplinary discharge planning process, led by the attending physician.  Recommendations may be updated based on patient status, additional functional criteria and insurance authorization.    Assistance Recommended at Discharge Frequent or constant Supervision/Assistance  Patient can return home with the following  A little help with walking and/or transfers;A little help with bathing/dressing/bathroom;Assistance with cooking/housework;Assist for transportation;Help with stairs or ramp for entrance   Equipment Recommendations    None      Precautions / Restrictions Precautions Precautions: Fall Precaution Comments: incontinent of urine in standing Restrictions Weight Bearing Restrictions: No       Mobility Bed Mobility Overal bed mobility: Needs Assistance Bed  Mobility: Supine to Sit     Supine to sit: Supervision          Transfers Overall transfer level: Needs assistance Equipment used: Rolling walker (2 wheels)   Sit to Stand: Min guard     Step pivot transfers: Min guard     General transfer comment: Pt utilized RW for functional ambulation into the bathroom for toileting and for transfer back to the bedside recliner.     Balance Overall balance assessment: Needs assistance Sitting-balance support: No upper extremity supported Sitting balance-Leahy Scale: Fair     Standing balance support: Bilateral upper extremity supported, During functional activity, Reliant on assistive device for balance Standing balance-Leahy Scale: Poor Standing balance comment: Pt needs BUE support for mobility                           ADL either performed or assessed with clinical judgement   ADL Overall ADL's : Needs assistance/impaired     Grooming: Wash/dry hands;Oral care;Min guard;Standing Grooming Details (indicate cue type and reason): at the sink     Lower Body Bathing: Minimal assistance;Sit to/from stand       Lower Body Dressing: Moderate assistance;Sit to/from stand Lower Body Dressing Details (indicate cue type and reason): to donn mesh brief, pad, and gripper socks Toilet Transfer: Minimal assistance;BSC/3in1;Rolling walker (2 wheels);Ambulation   Toileting- Clothing Manipulation and Hygiene: Minimal assistance;Sit to/from stand       Functional mobility during ADLs: Minimal assistance;Rolling walker (2 wheels) General ADL Comments: Vitals stable throughout with O2 at 92% or better on room air with activity.  Oxygen 96% at rest.  Pt with stress incontinence during functional mobility to the bathroom.  Cognition Arousal/Alertness: Awake/alert Behavior During Therapy: WFL for tasks assessed/performed Overall Cognitive Status: Within Functional Limits for tasks assessed                                                      Pertinent Vitals/ Pain       Pain Assessment Pain Assessment: Faces Pain Score: 0-No pain         Frequency  Min 1X/week        Progress Toward Goals  OT Goals(current goals can now be found in the care plan section)  Progress towards OT goals: Progressing toward goals  Acute Rehab OT Goals Patient Stated Goal: Pt agreeable to ambulate to the bathroom and for completion of grooming tasks at the sink. OT Goal Formulation: With patient Time For Goal Achievement: 06/16/23 Potential to Achieve Goals: Good  Plan Discharge plan remains appropriate       AM-PAC OT "6 Clicks" Daily Activity     Outcome Measure   Help from another person eating meals?: None Help from another person taking care of personal grooming?: A Little Help from another person toileting, which includes using toliet, bedpan, or urinal?: A Little Help from another person bathing (including washing, rinsing, drying)?: A Little Help from another person to put on and taking off regular upper body clothing?: A Little Help from another person to put on and taking off regular lower body clothing?: A Little 6 Click Score: 19    End of Session Equipment Utilized During Treatment: Gait belt;Rolling walker (2 wheels)  OT Visit Diagnosis: Unsteadiness on feet (R26.81);Other abnormalities of gait and mobility (R26.89);Repeated falls (R29.6);Muscle weakness (generalized) (M62.81);Pain   Activity Tolerance Patient tolerated treatment well   Patient Left in bed;with family/visitor present   Nurse Communication Mobility status        Time: 1420-1510 OT Time Calculation (min): 50 min  Charges: OT General Charges $OT Visit: 1 Visit OT Treatments $Self Care/Home Management : 38-52 mins  Perrin Maltese, OTR/L Acute Rehabilitation Services  Office [phone] 06/04/2023

## 2023-06-04 NOTE — Plan of Care (Signed)
Patient not improving condition remains without change

## 2023-06-05 ENCOUNTER — Inpatient Hospital Stay (HOSPITAL_COMMUNITY): Payer: Medicare Other

## 2023-06-05 DIAGNOSIS — N179 Acute kidney failure, unspecified: Secondary | ICD-10-CM | POA: Diagnosis not present

## 2023-06-05 DIAGNOSIS — I5032 Chronic diastolic (congestive) heart failure: Secondary | ICD-10-CM | POA: Diagnosis not present

## 2023-06-05 DIAGNOSIS — J9611 Chronic respiratory failure with hypoxia: Secondary | ICD-10-CM | POA: Diagnosis not present

## 2023-06-05 DIAGNOSIS — R1310 Dysphagia, unspecified: Secondary | ICD-10-CM

## 2023-06-05 DIAGNOSIS — K921 Melena: Secondary | ICD-10-CM | POA: Diagnosis not present

## 2023-06-05 DIAGNOSIS — J449 Chronic obstructive pulmonary disease, unspecified: Secondary | ICD-10-CM | POA: Diagnosis not present

## 2023-06-05 MED ORDER — IOHEXOL 350 MG/ML SOLN
75.0000 mL | Freq: Once | INTRAVENOUS | Status: AC | PRN
  Administered 2023-06-05: 75 mL via INTRAVENOUS

## 2023-06-05 MED ORDER — POTASSIUM CHLORIDE CRYS ER 20 MEQ PO TBCR
40.0000 meq | EXTENDED_RELEASE_TABLET | Freq: Once | ORAL | Status: AC
  Administered 2023-06-05: 40 meq via ORAL
  Filled 2023-06-05: qty 2

## 2023-06-05 MED ORDER — AMOXICILLIN-POT CLAVULANATE 875-125 MG PO TABS
1.0000 | ORAL_TABLET | Freq: Two times a day (BID) | ORAL | Status: DC
  Administered 2023-06-06: 1 via ORAL
  Filled 2023-06-05: qty 1

## 2023-06-05 MED ORDER — APIXABAN 5 MG PO TABS
5.0000 mg | ORAL_TABLET | Freq: Two times a day (BID) | ORAL | Status: DC
  Administered 2023-06-05 – 2023-06-06 (×3): 5 mg via ORAL
  Filled 2023-06-05 (×3): qty 1

## 2023-06-05 MED ORDER — POLYETHYLENE GLYCOL 3350 17 G PO PACK
17.0000 g | PACK | Freq: Every day | ORAL | Status: DC
  Administered 2023-06-05 – 2023-06-06 (×2): 17 g via ORAL
  Filled 2023-06-05 (×2): qty 1

## 2023-06-05 NOTE — Plan of Care (Signed)

## 2023-06-05 NOTE — Progress Notes (Signed)
Physical Therapy Treatment Patient Details Name: Julie Rice MRN: [id number] DOB: 1935/12/05 Today's Date: 06/05/2023   History of Present Illness The pt is an 87 yo female presenting 7/28 after getting stuck on toilet for ~5 hours, pt hypotensive upon arrival of EMS. Pt found to have AKI, new LBBB, possible colitis, CT negative for acute abnormality. PMH includes: CAD, HFpEF, PAF/SVT on Eliquis, COPD, anxiety, depression, bipolar disorder, fibromyalgia, HTN, TIA, urinary incontinence, and esophagitis with stricture status post dilation.    PT Comments  Patient seen for progressive functional mobility. Toileted immediately upon standing from EOB and pt then able to ambulate with briefs and pad. Requires vc for upright posture and safe use of RW (ran into objects x 2 with left side of RW). Gait velocity indicative of incr risk of falls at <1.8 ft/sec. Will benefit from continued therapy in order to return home alone.     If plan is discharge home, recommend the following: A little help with walking and/or transfers;A little help with bathing/dressing/bathroom;Assistance with cooking/housework;Assist for transportation;Help with stairs or ramp for entrance   Can travel by private vehicle     Yes  Equipment Recommendations  None recommended by PT    Recommendations for Other Services       Precautions / Restrictions Precautions Precautions: Fall Precaution Comments: incontinent of urine in standing Restrictions Weight Bearing Restrictions: No     Mobility  Bed Mobility Overal bed mobility: Needs Assistance Bed Mobility: Supine to Sit     Supine to sit: Supervision     General bed mobility comments: HOB 20, no use of rail; mild dizziness therefore supervision for safety    Transfers Overall transfer level: Needs assistance Equipment used: Rolling walker (2 wheels) Transfers: Sit to/from Stand, Bed to chair/wheelchair/BSC Sit to Stand: Min guard   Step pivot transfers:  Min guard       General transfer comment: Pt utilized RW    Ambulation/Gait Ambulation/Gait assistance: Land (Feet): 40 Feet Assistive device: Rolling walker (2 wheels) Gait Pattern/deviations: Step-through pattern, Decreased stride length, Trunk flexed   Gait velocity interpretation: <1.8 ft/sec, indicate of risk for recurrent falls   General Gait Details: toileted to BSC immediately upon getting up due to h/o incontinence. Utilized hospital brief + pad and able to ambulate. vc for upright posture; distance limited by left knee pain, however pt denied needing pain meds. RN made aware   Stairs             Wheelchair Mobility     Tilt Bed    Modified Rankin (Stroke Patients Only)       Balance Overall balance assessment: Needs assistance Sitting-balance support: No upper extremity supported Sitting balance-Leahy Scale: Fair     Standing balance support: Bilateral upper extremity supported, During functional activity, Reliant on assistive device for balance Standing balance-Leahy Scale: Poor Standing balance comment: Pt needs BUE support for mobility                            Cognition Arousal/Alertness: Awake/alert Behavior During Therapy: WFL for tasks assessed/performed Overall Cognitive Status: Within Functional Limits for tasks assessed                                          Exercises      General Comments General comments (skin integrity,  edema, etc.): VSS on RA via monitor      Pertinent Vitals/Pain Pain Assessment Pain Assessment: Faces Faces Pain Scale: Hurts little more Pain Location: feet and L knee Pain Descriptors / Indicators: Aching, Discomfort Pain Intervention(s): Limited activity within patient's tolerance    Home Living                          Prior Function            PT Goals (current goals can now be found in the care plan section) Acute Rehab PT Goals Patient  Stated Goal: return to independence when safe Time For Goal Achievement: 06/16/23 Potential to Achieve Goals: Good Progress towards PT goals: Progressing toward goals    Frequency    Min 1X/week      PT Plan Current plan remains appropriate    Co-evaluation              AM-PAC PT "6 Clicks" Mobility   Outcome Measure  Help needed turning from your back to your side while in a flat bed without using bedrails?: A Little Help needed moving from lying on your back to sitting on the side of a flat bed without using bedrails?: A Little Help needed moving to and from a bed to a chair (including a wheelchair)?: A Little Help needed standing up from a chair using your arms (e.g., wheelchair or bedside chair)?: A Little Help needed to walk in hospital room?: A Little Help needed climbing 3-5 steps with a railing? : Total 6 Click Score: 16    End of Session Equipment Utilized During Treatment: Gait belt Activity Tolerance: Patient tolerated treatment well Patient left: in chair;with call bell/phone within reach;with chair alarm set;with nursing/sitter in room Nurse Communication: Mobility status PT Visit Diagnosis: Other abnormalities of gait and mobility (R26.89);Muscle weakness (generalized) (M62.81)     Time: 4098-1191 PT Time Calculation (min) (ACUTE ONLY): 21 min  Charges:    $Gait Training: 8-22 mins PT General Charges $$ ACUTE PT VISIT: 1 Visit                      Jerolyn Center, PT Acute Rehabilitation Services  Office [phone]    Julie Rice 06/05/2023, 9:37 AM

## 2023-06-05 NOTE — Plan of Care (Signed)

## 2023-06-05 NOTE — Progress Notes (Signed)
Tele called that pt has widened QRS. Pt is asymptomatic. Latest VS: T: 98.1, BP: 169/78(104), HR: 77bpm, RR: 16cpm, O2 sat: 92%. Julie Becton, MD made aware. Ordered for EKG STAT.Will continue to monitor the patient

## 2023-06-05 NOTE — Progress Notes (Signed)
     [facility] Progress Note  CC:  GI bleed  Subjective:  Feeling better.  No further bleeding.  Objective:  Vital signs in last 24 hours: Temp:  [97.7 F (36.5 C)-98.3 F (36.8 C)] 97.9 F (36.6 C) (08/01 1221) Pulse Rate:  [65-82] 65 (08/01 1221) Resp:  [12-18] 18 (08/01 1221) BP: (125-169)/(70-91) 139/70 (08/01 1221) SpO2:  [91 %-98 %] 94 % (08/01 1221) Last BM Date : 06/01/23 General:  Alert, Well-developed, in NAD Heart:  Regular rate and rhythm Pulm:  CTAB.   Abdomen:  Soft, non-distended.  BS present.  Mild upper abdominal TTP. Neurologic:  Alert and oriented x 4;  grossly normal neurologically. Psych:  Alert and cooperative. Normal mood and affect.  Intake/Output from previous day: 07/31 0701 - 08/01 0700 In: 1280 [P.O.:880; IV Piggyback:400] Out: 1000 [Urine:1000] Intake/Output this shift: Total I/O In: 420 [P.O.:420] Out: 1100 [Urine:1100]  Lab Results: Recent Labs    06/03/23 0147 06/04/23 0117 06/05/23 0624  WBC 13.7* 10.3 10.6*  HGB 9.8* 10.0* 10.9*  HCT 30.2* 31.1* 33.8*  PLT 189 198 228   BMET Recent Labs    06/03/23 0147 06/04/23 0117 06/05/23 0624  NA 141 140 138  K 3.9 3.5 3.2*  CL 108 108 103  CO2 25 24 26   GLUCOSE 112* 101* 97  BUN 21 10 9   CREATININE 0.87 0.64 0.74  CALCIUM 8.2* 8.0* 8.0*   Assessment / Plan: 87 year old female with history of CAD, HFpEF, A-fib on Eliquis, COPD/chronic respiratory failure, esophageal stricture presented with hypotension. We are consulted for hematochezia. Also endorsed some melena and dysphagia. She states that her last EGD 02/2023 with Botox and dilation did help, but her dysphagia has returned. Hemoglobin dropped from 13.5 to 9.8 and has now stabilized over the past two days and is 10.9 grams today. CT A/P shows inflammation in the transverse colon. Her last colonoscopy in 2018 showed only internal hemorrhoids.  They have restarted her Eliquis today, 8/1. -EGD and colonoscopy were  canceled because she could not tolerate the bowel prep. -CT angio abdomen pelvis ordered for today. -Await GI path panel and C. difficile -Continue supportive care. -I discontinued her Rocephin and Flagyl as she has completed 5 days.    LOS: 4 days   Princella Pellegrini. Alonnah Lampkins  06/05/2023, 3:45 PM

## 2023-06-05 NOTE — Progress Notes (Signed)
PROGRESS NOTE        PATIENT DETAILS Name: Julie Rice Age: 87 y.o. Sex: female Date of Birth: January 04, 1936 Admit Date: 06/01/2023 Admitting Physician Briscoe Deutscher, MD UJW:[id number], [name], NP  Brief Summary: Patient is a 87 y.o.  female history of PAF on Eliquis, CAD, chronic HFpEF, COPD with chronic hypoxic respiratory failure on home O2, who presented with lower abdominal pain, weakness, possible syncope-apparently was stuck in her toilet for couple of hours-she was found to be hypotensive-CT imaging showed transverse colitis-patient was then admitted to the hospitalist service.  See below for further details.   Significant events: 7/28>> admit to [facility]  Significant studies: 7/28>> CXR: No PNA 7/28>> CT head: No acute intracranial abnormality 7/28>> CT chest/abdomen/pelvis: Transverse colitis 7/29>> echo: EF 55-60%, no regional wall motion abnormality.  Significant microbiology data: 7/28>> blood culture: No growth  Procedures: None  Consults: None  Subjective: No nausea or vomiting.  Some mild abdominal pain.  Has not had any diarrhea-if fact has not had had a bowel movement for the past several days.  Objective: Vitals: Blood pressure (!) 166/77, pulse 82, temperature 97.7 F (36.5 C), temperature source Oral, resp. rate 17, height 5\' 1"  (1.549 m), weight 71.6 kg, SpO2 98%.   Exam: Gen Exam:Alert awake-not in any distress HEENT:atraumatic, normocephalic Chest: B/L clear to auscultation anteriorly CVS:S1S2 regular Abdomen:soft-mildly tender in the mid abdominal area-but overall benign. Extremities:no edema Neurology: Non focal Skin: no rash  Pertinent Labs/Radiology:    Latest Ref Rng & Units 06/05/2023    6:24 AM 06/04/2023    1:17 AM 06/03/2023    1:47 AM  CBC  WBC 4.0 - 10.5 K/uL 10.6  10.3  13.7   Hemoglobin 12.0 - 15.0 g/dL 95.6  21.3  9.8   Hematocrit 36.0 - 46.0 % 33.8  31.1  30.2   Platelets 150 - 400 K/uL 228  198  189      Lab Results  Component Value Date   NA 138 06/05/2023   K 3.2 (L) 06/05/2023   CL 103 06/05/2023   CO2 26 06/05/2023      Assessment/Plan: Severe sepsis due to infectious colitis Sepsis physiology has resolved-leukocytosis better No diarrhea-minimal abdominal pain Continue Rocephin/Flagyl x 5 days total.  Presyncope/weakness Secondary to hypotension Overall improved Mobilize with PT/OT  Hematochezia Mild acute blood loss anemia No further hematochezia for the past several days-Hb remained stable-noted that GI does not plan any endoscopy evaluation-until patient has improved-hence will resume Eliquis and see how she does.    AKI Hemodynamically mediated in the setting of hypotension/sepsis Creatinine has improved-AKI has resolved. Follow electrolytes periodically  Hypokalemia Due to Lasix Replete/recheck.  COPD with chronic hypoxic respiratory failure on home O2 Stable Continue bronchodilators  Chronic HFpEF Stable-has developed some lower extremity edema-Lasix resumed on 7/31-some improvement in lower extremity edema.  PAF Sinus rhythm Amiodarone/metoprolol Resume Eliquis.  History of CAD/new LBBB Troponins negative Echo stable Continue metoprolol/statin  HTN BP stable Losartan/metoprolol  BMI: Estimated body mass index is 29.83 kg/m as calculated from the following:   Height as of this encounter: 5\' 1"  (1.549 m).   Weight as of this encounter: 71.6 kg.   Code status:   Code Status: DNR   DVT Prophylaxis: SCDs Start: 06/01/23 0951 SCDs Start: 06/01/23 0948    Family Communication: None at bedside   Disposition  Plan: Status is: Inpatient Remains inpatient appropriate because: Severity of illness   Planned Discharge Destination:Skilled nursing facility   Diet: Diet Order             Diet clear liquid Fluid consistency: Thin  Diet effective now                     Antimicrobial agents: Anti-infectives (From admission, onward)     Start     Dose/Rate Route Frequency Ordered Stop   06/01/23 1045  linezolid (ZYVOX) IVPB 600 mg  Status:  Discontinued        600 mg 300 mL/hr over 60 Minutes Intravenous Every 12 hours 06/01/23 1044 06/03/23 0917   06/01/23 1015  cefTRIAXone (ROCEPHIN) 2 g in sodium chloride 0.9 % 100 mL IVPB        2 g 200 mL/hr over 30 Minutes Intravenous Daily 06/01/23 1011     06/01/23 1015  metroNIDAZOLE (FLAGYL) IVPB 500 mg        500 mg 100 mL/hr over 60 Minutes Intravenous Every 12 hours 06/01/23 1011          MEDICATIONS: Scheduled Meds:  amiodarone  200 mg Oral Daily   clonazePAM  0.5 mg Oral BID   furosemide  40 mg Oral Daily   losartan  50 mg Oral Daily   metoprolol succinate  25 mg Oral Daily   multivitamin with minerals  1 tablet Oral q AM   pantoprazole (PROTONIX) IV  40 mg Intravenous Q12H   peg 3350 powder  0.5 kit Oral Once   And   peg 3350 powder  0.5 kit Oral Once   polyvinyl alcohol  1 drop Both Eyes BID   rosuvastatin  20 mg Oral q1800   Continuous Infusions:  cefTRIAXone (ROCEPHIN)  IV 2 g (06/05/23 0951)   metronidazole 500 mg (06/04/23 2124)   PRN Meds:.acetaminophen, albuterol, buPROPion, hydrALAZINE, HYDROmorphone (DILAUDID) injection, oxyCODONE-acetaminophen, prochlorperazine   I have personally reviewed following labs and imaging studies  LABORATORY DATA: CBC: Recent Labs  Lab 06/01/23 0154 06/01/23 0541 06/02/23 0548 06/02/23 0550 06/03/23 0147 06/04/23 0117 06/05/23 0624  WBC 25.5*  --  17.3*  --  13.7* 10.3 10.6*  NEUTROABS 23.2*  --   --   --   --   --   --   HGB 13.5   < > 10.5* 10.4* 9.8* 10.0* 10.9*  HCT 42.3   < > 33.0* 32.8* 30.2* 31.1* 33.8*  MCV 101.2*  --  98.5  --  97.1 100.6* 98.5  PLT 267  --  225  --  189 198 228   < > = values in this interval not displayed.    Basic Metabolic Panel: Recent Labs  Lab 06/01/23 0154 06/02/23 0548 06/02/23 0550 06/03/23 0147 06/04/23 0117 06/05/23 0624  NA 138 138  --  141 140 138  K  4.6 4.2  --  3.9 3.5 3.2*  CL 105 105  --  108 108 103  CO2 19* 26  --  25 24 26   GLUCOSE 163* 120*  --  112* 101* 97  BUN 50* 38*  --  21 10 9   CREATININE 1.92* 1.14*  --  0.87 0.64 0.74  CALCIUM 8.2* 8.3*  --  8.2* 8.0* 8.0*  MG  --   --  2.4 2.2  --   --     GFR: Estimated Creatinine Clearance: 46.5 mL/min (by C-G formula based on SCr of 0.74 mg/dL).  Liver Function Tests: Recent Labs  Lab 06/01/23 0154  AST 31  ALT 25  ALKPHOS 179*  BILITOT 1.1  PROT 6.2*  ALBUMIN 3.4*   No results for input(s): "LIPASE", "AMYLASE" in the last 168 hours. No results for input(s): "AMMONIA" in the last 168 hours.  Coagulation Profile: Recent Labs  Lab 06/02/23 0550  INR 1.3*    Cardiac Enzymes: Recent Labs  Lab 06/01/23 0154  CKTOTAL 139    BNP (last 3 results) Recent Labs    02/21/23 1412 03/10/23 1234 03/21/23 1336  PROBNP 2,543* 2,500* 1,817*    Lipid Profile: No results for input(s): "CHOL", "HDL", "LDLCALC", "TRIG", "CHOLHDL", "LDLDIRECT" in the last 72 hours.  Thyroid Function Tests: No results for input(s): "TSH", "T4TOTAL", "FREET4", "T3FREE", "THYROIDAB" in the last 72 hours.  Anemia Panel: No results for input(s): "VITAMINB12", "FOLATE", "FERRITIN", "TIBC", "IRON", "RETICCTPCT" in the last 72 hours.  Urine analysis:    Component Value Date/Time   COLORURINE YELLOW 06/02/2023 1500   APPEARANCEUR CLEAR 06/02/2023 1500   LABSPEC 1.015 06/02/2023 1500   PHURINE 5.0 06/02/2023 1500   GLUCOSEU NEGATIVE 06/02/2023 1500   GLUCOSEU NEGATIVE 12/13/2022 1115   HGBUR NEGATIVE 06/02/2023 1500   HGBUR negative 07/19/2009 1509   BILIRUBINUR NEGATIVE 06/02/2023 1500   BILIRUBINUR neg 04/12/2020 1056   KETONESUR NEGATIVE 06/02/2023 1500   PROTEINUR NEGATIVE 06/02/2023 1500   UROBILINOGEN 0.2 12/13/2022 1115   NITRITE NEGATIVE 06/02/2023 1500   LEUKOCYTESUR TRACE (A) 06/02/2023 1500    Sepsis Labs: Lactic Acid, Venous    Component Value Date/Time    LATICACIDVEN 1.2 06/01/2023 0609    MICROBIOLOGY: Recent Results (from the past 240 hour(s))  Culture, blood (Routine X 2) w Reflex to ID Panel     Status: None (Preliminary result)   Collection Time: 06/01/23 10:09 AM   Specimen: BLOOD RIGHT HAND  Result Value Ref Range Status   Specimen Description BLOOD RIGHT HAND  Final   Special Requests NONE  Final   Culture   Final    NO GROWTH 4 DAYS Performed at [facility], [address], [place], [place] [address]    Report Status PENDING  Incomplete  Culture, blood (Routine X 2) w Reflex to ID Panel     Status: None (Preliminary result)   Collection Time: 06/01/23 10:14 AM   Specimen: BLOOD RIGHT HAND  Result Value Ref Range Status   Specimen Description BLOOD RIGHT HAND  Final   Special Requests   Final    BOTTLES DRAWN AEROBIC AND ANAEROBIC Blood Culture results may not be optimal due to an inadequate volume of blood received in culture bottles   Culture   Final    NO GROWTH 4 DAYS Performed at [facility], [address] [place], [place] [address]    Report Status PENDING  Incomplete  MRSA Next Gen by PCR, Nasal     Status: None   Collection Time: 06/01/23 10:45 AM   Specimen: Nasal Swab  Result Value Ref Range Status   MRSA by PCR Next Gen NOT DETECTED NOT DETECTED Final    Comment: (NOTE) The GeneXpert MRSA Assay (FDA approved for NASAL specimens only), is one component of a comprehensive MRSA colonization surveillance program. It is not intended to diagnose MRSA infection nor to guide or monitor treatment for MRSA infections. Test performance is not FDA approved in patients less than 90 years old. Performed at [facility], [address], [place], [place] [address]  RADIOLOGY STUDIES/RESULTS: No results found.   LOS: 4 days   Jeoffrey Massed, MD  [facility]    To contact the attending provider between 7A-7P or the covering provider during after hours 7P-7A, please log into the  web site [email or web address] and access using universal [place] password for that web site. If you do not have the password, please call the hospital operator.  06/05/2023, 11:12 AM

## 2023-06-05 NOTE — TOC Progression Note (Signed)
Transition of Care [facility]) - Progression Note    Patient Details  Name: GAYA BANKES MRN: [id number] Date of Birth: 06-Feb-1936  [facility]) CM/SW Contact  Mearl Latin, LCSW Phone Number: 06/05/2023, 2:26 PM  Clinical Narrative:    [facility] accepted patient. CSW initiated insurance process and received approval, Ref# [id number], effective 06/06/2023-06/10/2023.   Expected Discharge Plan: Skilled Nursing Facility Barriers to Discharge: Continued Medical Work up  Expected Discharge Plan and Services In-house Referral: Clinical Social Work   Post Acute Care Choice: Skilled Nursing Facility Living arrangements for the past 2 months: Skilled Nursing Facility                                       Social Determinants of Health (SDOH) Interventions SDOH Screenings   Food Insecurity: No Food Insecurity (12/08/2022)  Housing: Low Risk  (12/08/2022)  Transportation Needs: No Transportation Needs (12/08/2022)  Utilities: Not At Risk (12/08/2022)  Alcohol Screen: Low Risk  (02/14/2022)  Depression (PHQ2-9): Medium Risk (04/01/2023)  Financial Resource Strain: Low Risk  (02/14/2022)  Physical Activity: Inactive (02/14/2022)  Social Connections: Moderately Integrated (02/14/2022)  Stress: No Stress Concern Present (02/14/2022)  Tobacco Use: Low Risk  (06/01/2023)    Readmission Risk Interventions     No data to display

## 2023-06-06 ENCOUNTER — Encounter: Payer: Medicare Other | Admitting: Physician Assistant

## 2023-06-06 DIAGNOSIS — J449 Chronic obstructive pulmonary disease, unspecified: Secondary | ICD-10-CM | POA: Diagnosis not present

## 2023-06-06 DIAGNOSIS — M069 Rheumatoid arthritis, unspecified: Secondary | ICD-10-CM | POA: Diagnosis not present

## 2023-06-06 DIAGNOSIS — D6869 Other thrombophilia: Secondary | ICD-10-CM | POA: Diagnosis not present

## 2023-06-06 DIAGNOSIS — K222 Esophageal obstruction: Secondary | ICD-10-CM

## 2023-06-06 DIAGNOSIS — J9611 Chronic respiratory failure with hypoxia: Secondary | ICD-10-CM | POA: Diagnosis not present

## 2023-06-06 DIAGNOSIS — I509 Heart failure, unspecified: Secondary | ICD-10-CM | POA: Diagnosis not present

## 2023-06-06 DIAGNOSIS — J90 Pleural effusion, not elsewhere classified: Secondary | ICD-10-CM

## 2023-06-06 DIAGNOSIS — M7918 Myalgia, other site: Secondary | ICD-10-CM

## 2023-06-06 DIAGNOSIS — R531 Weakness: Secondary | ICD-10-CM | POA: Diagnosis not present

## 2023-06-06 DIAGNOSIS — I480 Paroxysmal atrial fibrillation: Secondary | ICD-10-CM | POA: Diagnosis not present

## 2023-06-06 DIAGNOSIS — Z7901 Long term (current) use of anticoagulants: Secondary | ICD-10-CM | POA: Diagnosis not present

## 2023-06-06 DIAGNOSIS — M316 Other giant cell arteritis: Secondary | ICD-10-CM

## 2023-06-06 DIAGNOSIS — Z7401 Bed confinement status: Secondary | ICD-10-CM | POA: Diagnosis not present

## 2023-06-06 DIAGNOSIS — K529 Noninfective gastroenteritis and colitis, unspecified: Secondary | ICD-10-CM | POA: Diagnosis not present

## 2023-06-06 DIAGNOSIS — E876 Hypokalemia: Secondary | ICD-10-CM | POA: Diagnosis not present

## 2023-06-06 DIAGNOSIS — Z8659 Personal history of other mental and behavioral disorders: Secondary | ICD-10-CM

## 2023-06-06 DIAGNOSIS — M19042 Primary osteoarthritis, left hand: Secondary | ICD-10-CM | POA: Diagnosis not present

## 2023-06-06 DIAGNOSIS — M79671 Pain in right foot: Secondary | ICD-10-CM | POA: Diagnosis not present

## 2023-06-06 DIAGNOSIS — Z8719 Personal history of other diseases of the digestive system: Secondary | ICD-10-CM

## 2023-06-06 DIAGNOSIS — I5032 Chronic diastolic (congestive) heart failure: Secondary | ICD-10-CM | POA: Diagnosis not present

## 2023-06-06 DIAGNOSIS — F411 Generalized anxiety disorder: Secondary | ICD-10-CM

## 2023-06-06 DIAGNOSIS — N179 Acute kidney failure, unspecified: Secondary | ICD-10-CM | POA: Diagnosis not present

## 2023-06-06 DIAGNOSIS — R700 Elevated erythrocyte sedimentation rate: Secondary | ICD-10-CM | POA: Diagnosis not present

## 2023-06-06 DIAGNOSIS — A419 Sepsis, unspecified organism: Secondary | ICD-10-CM | POA: Diagnosis not present

## 2023-06-06 DIAGNOSIS — M19041 Primary osteoarthritis, right hand: Secondary | ICD-10-CM | POA: Diagnosis not present

## 2023-06-06 DIAGNOSIS — K224 Dyskinesia of esophagus: Secondary | ICD-10-CM

## 2023-06-06 DIAGNOSIS — M79672 Pain in left foot: Secondary | ICD-10-CM | POA: Diagnosis not present

## 2023-06-06 DIAGNOSIS — M79642 Pain in left hand: Secondary | ICD-10-CM | POA: Diagnosis not present

## 2023-06-06 DIAGNOSIS — Z96651 Presence of right artificial knee joint: Secondary | ICD-10-CM | POA: Diagnosis not present

## 2023-06-06 DIAGNOSIS — M797 Fibromyalgia: Secondary | ICD-10-CM

## 2023-06-06 DIAGNOSIS — Z8709 Personal history of other diseases of the respiratory system: Secondary | ICD-10-CM

## 2023-06-06 DIAGNOSIS — G609 Hereditary and idiopathic neuropathy, unspecified: Secondary | ICD-10-CM

## 2023-06-06 DIAGNOSIS — E038 Other specified hypothyroidism: Secondary | ICD-10-CM

## 2023-06-06 DIAGNOSIS — I447 Left bundle-branch block, unspecified: Secondary | ICD-10-CM | POA: Diagnosis not present

## 2023-06-06 DIAGNOSIS — K921 Melena: Secondary | ICD-10-CM | POA: Diagnosis not present

## 2023-06-06 DIAGNOSIS — R9431 Abnormal electrocardiogram [ECG] [EKG]: Secondary | ICD-10-CM

## 2023-06-06 DIAGNOSIS — M79641 Pain in right hand: Secondary | ICD-10-CM | POA: Diagnosis not present

## 2023-06-06 DIAGNOSIS — K449 Diaphragmatic hernia without obstruction or gangrene: Secondary | ICD-10-CM

## 2023-06-06 MED ORDER — SPIRONOLACTONE 25 MG PO TABS
25.0000 mg | ORAL_TABLET | Freq: Every day | ORAL | Status: DC
  Administered 2023-06-06: 25 mg via ORAL
  Filled 2023-06-06: qty 1

## 2023-06-06 MED ORDER — METOPROLOL SUCCINATE ER 50 MG PO TB24
50.0000 mg | ORAL_TABLET | Freq: Every day | ORAL | Status: DC
  Administered 2023-06-06: 50 mg via ORAL
  Filled 2023-06-06: qty 1

## 2023-06-06 MED ORDER — AMOXICILLIN-POT CLAVULANATE 875-125 MG PO TABS: 1.0000 | ORAL_TABLET | Freq: Two times a day (BID) | ORAL | Status: AC

## 2023-06-06 MED ORDER — POLYETHYLENE GLYCOL 3350 17 G PO PACK: 17.0000 g | PACK | Freq: Every day | ORAL | Status: DC

## 2023-06-06 MED ORDER — METOPROLOL TARTRATE 50 MG PO TABS: 50.0000 mg | ORAL_TABLET | Freq: Every day | ORAL | Status: DC

## 2023-06-06 MED ORDER — CLONAZEPAM 0.5 MG PO TABS
0.5000 mg | ORAL_TABLET | Freq: Two times a day (BID) | ORAL | 0 refills | Status: DC
Start: 2023-06-06 — End: 2023-08-21

## 2023-06-06 NOTE — TOC Transition Note (Addendum)
Transition of Care [facility] - CM/SW Discharge Note   Patient Details  Name: Julie Rice MRN: [id number] Date of Birth: February 23, 1936  [facility]) CM/SW Contact:  Delilah Shan, LCSWA Phone Number: 06/06/2023, 10:42 AM   Clinical Narrative:     Per MD patient ready for DC to [facility] CSW spoke with tracy with [facility] who confirmed she can accept patient for DC today.  Patient will DC to: [facility]  Anticipated DC date: 06/06/2023  Family notified: Diane  Transport by: Sharin Mons  ?  Per MD patient ready for DC to [facility] . RN, patient, patient's family, and facility notified of DC. Discharge Summary sent to facility. RN given number for report tele# [phone] RM# [phone]. DC packet on chart. DNR signed by MD attached to patients DC packet.Ambulance transport requested for patient.  CSW signing off.    Final next level of care: Skilled Nursing Facility Barriers to Discharge: No Barriers Identified   Patient Goals and CMS Choice CMS Medicare.gov Compare Post Acute Care list provided to:: Patient Choice offered to / list presented to : Patient  Discharge Placement                Patient chooses bed at: [facility] [place] Patient to be transferred to facility by: [facility] Name of family member notified: Diane Patient and family notified of of transfer: 06/06/23  Discharge Plan and Services Additional resources added to the After Visit Summary for   In-house Referral: Clinical Social Work   Post Acute Care Choice: Skilled Nursing Facility                               Social Determinants of Health (SDOH) Interventions SDOH Screenings   Food Insecurity: No Food Insecurity (12/08/2022)  Housing: Low Risk  (12/08/2022)  Transportation Needs: No Transportation Needs (12/08/2022)  Utilities: Not At Risk (12/08/2022)  Alcohol Screen: Low Risk  (02/14/2022)  Depression (PHQ2-9): Medium Risk (04/01/2023)  Financial Resource Strain: Low Risk  (02/14/2022)  Physical  Activity: Inactive (02/14/2022)  Social Connections: Moderately Integrated (02/14/2022)  Stress: No Stress Concern Present (02/14/2022)  Tobacco Use: Low Risk  (06/01/2023)     Readmission Risk Interventions     No data to display

## 2023-06-06 NOTE — Discharge Summary (Addendum)
PATIENT DETAILS Name: Julie Rice Age: 87 y.o. Sex: female Date of Birth: Apr 20, 1936 MRN: [id number]. Admitting Physician: Briscoe Deutscher, MD VOZ:[id number], [name], NP  Admit Date: 06/01/2023 Discharge date: 06/06/2023  Recommendations for Outpatient Follow-up:  Follow up with PCP in 1-2 weeks Please obtain CMP/CBC in one week Please ensure follow-up with gastroenterology.  Admitted From:  Home  Disposition: Skilled nursing facility   Discharge Condition: good  CODE STATUS:   Code Status: DNR   Diet recommendation:  Diet Order             DIET SOFT Room service appropriate? Yes; Fluid consistency: Thin  Diet effective now           Diet - low sodium heart healthy                    Brief Summary: Patient is a 87 y.o.  female history of PAF on Eliquis, CAD, chronic HFpEF, COPD with chronic hypoxic respiratory failure on home O2, who presented with lower abdominal pain, weakness, possible syncope-apparently was stuck in her toilet for couple of hours-she was found to be hypotensive-CT imaging showed transverse colitis-patient was then admitted to the hospitalist service.  See below for further details.    Significant events: 7/28>> admit to [facility]   Significant studies: 7/28>> CXR: No PNA 7/28>> CT head: No acute intracranial abnormality 7/28>> CT chest/abdomen/pelvis: Transverse colitis 7/29>> echo: EF 55-60%, no regional wall motion abnormality 8/01>> CT angio abdomen: Resolving colitis-no evidence of bowel ischemia-no high-grade mesenteric arterial disease.   Significant microbiology data: 7/28>> blood culture: No growth   Procedures: None   Consults: GI  Brief Hospital Course: Severe sepsis due to infectious colitis Sepsis physiology has resolved Treated with supportive care-antibiotics-diet slowly advance Abdominal pain has essentially resolved No diarrhea since the day of admission-hence stool studies never sent Repeat CT on 8/1-no evidence of  mesenteric ischemia-colitis/inflammation has essentially resolved Was on Rocephin/Flagyl-has been transitioned to Augmentin which will be continued for 2 more days.   Presyncope/weakness Secondary to hypotension Overall improved Mobilize with PT/OT-SNF recommended.   Hematochezia Mild acute blood loss anemia No further hematochezia for the past several days-Hb remained stable-noted that GI does not plan any endoscopy evaluation-until patient has improved-hence Eliquis was resumed-no overt bleeding noted-Hb remained stable.  Please ensure follow-up with GI in the outpatient setting for repeat endoscopy evaluation.    AKI Hemodynamically mediated in the setting of hypotension/sepsis Creatinine has improved-AKI has resolved. Follow electrolytes periodically   Hypokalemia Due to Lasix Replete/recheck.   COPD with chronic hypoxic respiratory failure on home O2 Stable Continue bronchodilators   Chronic HFpEF Compensated overall-minimal left lower extremity edema-much improved after restarting Lasix-will be resumed on Aldactone on discharge.   PAF Sinus rhythm Amiodarone/metoprolol Tolerating resumption of Eliquis well without any overt GI bleeding.   History of CAD/new LBBB Troponins negative Echo stable Continue metoprolol/statin   HTN BP on the higher side Continue losartan Dosage of metoprolol increased on day of discharge-Aldactone added Attending MD to reassess at SNF and adjust medications accordingly.     BMI: Estimated body mass index is 29.83 kg/m as calculated from the following:   Height as of this encounter: 5\' 1"  (1.549 m).   Weight as of this encounter: 71.6 kg.     Discharge Diagnoses:  Principal Problem:   AKI (acute kidney injury) (HCC) Active Problems:   Rectal bleed   Chronic heart failure with preserved ejection fraction (HFpEF) (HCC)   Chronic  respiratory failure with hypoxia (HCC)   COPD (chronic obstructive pulmonary disease) (HCC)   PAF  (paroxysmal atrial fibrillation) (HCC)   Rheumatoid arthritis involving multiple sites [facility])   Coronary artery disease involving native coronary artery of native heart without angina pectoris   Lactic acidosis   Syncope   Noninfectious gastroenteritis   Hematochezia   Chronic anticoagulation   Discharge Instructions:  Activity:  As tolerated with Full fall precautions use walker/cane & assistance as needed   Discharge Instructions     Call MD for:  difficulty breathing, headache or visual disturbances   Complete by: As directed    Call MD for:  severe uncontrolled pain   Complete by: As directed    Diet - low sodium heart healthy   Complete by: As directed    Discharge instructions   Complete by: As directed    Follow with Primary MD  Shirline Frees, NP in 1-2 weeks  Please follow up with gastroenterology clinic in the next several weeks for endoscopic evaluation.  Please get a complete blood count and chemistry panel checked by your Primary MD at your next visit, and again as instructed by your Primary MD.  Get Medicines reviewed and adjusted: Please take all your medications with you for your next visit with your Primary MD  Laboratory/radiological data: Please request your Primary MD to go over all hospital tests and procedure/radiological results at the follow up, please ask your Primary MD to get all Hospital records sent to his/her office.  In some cases, they will be blood work, cultures and biopsy results pending at the time of your discharge. Please request that your primary care M.D. follows up on these results.  Also Note the following: If you experience worsening of your admission symptoms, develop shortness of breath, life threatening emergency, suicidal or homicidal thoughts you must seek medical attention immediately by calling 911 or calling your MD immediately  if symptoms less severe.  You must read complete instructions/literature along with all the  possible adverse reactions/side effects for all the Medicines you take and that have been prescribed to you. Take any new Medicines after you have completely understood and accpet all the possible adverse reactions/side effects.   Do not drive when taking Pain medications or sleeping medications (Benzodaizepines)  Do not take more than prescribed Pain, Sleep and Anxiety Medications. It is not advisable to combine anxiety,sleep and pain medications without talking with your primary care practitioner  Special Instructions: If you have smoked or chewed Tobacco  in the last 2 yrs please stop smoking, stop any regular Alcohol  and or any Recreational drug use.  Wear Seat belts while driving.  Please note: You were cared for by a hospitalist during your hospital stay. Once you are discharged, your primary care physician will handle any further medical issues. Please note that NO REFILLS for any discharge medications will be authorized once you are discharged, as it is imperative that you return to your primary care physician (or establish a relationship with a primary care physician if you do not have one) for your post hospital discharge needs so that they can reassess your need for medications and monitor your lab values.   Increase activity slowly   Complete by: As directed       Allergies as of 06/06/2023       Reactions   Tape Other (See Comments)   Band aides, adhesive tape; Redness and pulls skin off   Tramadol Other (See Comments)  Pt has prolonged Qtc interval of 540- cannot give tramadol per pharmacy   Morphine Itching, Rash, Other (See Comments)   Flushing        Medication List     TAKE these medications    acetaminophen 500 MG tablet Commonly known as: TYLENOL Take 1,000 mg by mouth every 6 (six) hours as needed for moderate pain, headache or fever.   albuterol 108 (90 Base) MCG/ACT inhaler Commonly known as: VENTOLIN HFA INHALE TWO PUFFS BY MOUTH EVERY 6 HOURS AS  NEEDED FOR WHEEZING OR SHORTNESS OF BREATH What changed:  how much to take how to take this when to take this reasons to take this additional instructions   amiodarone 200 MG tablet Commonly known as: PACERONE NEW PRESCRIPTION REQUEST: TAKE ONE TABLET BY MOUTH DAILY What changed: See the new instructions.   amoxicillin-clavulanate 875-125 MG tablet Commonly known as: AUGMENTIN Take 1 tablet by mouth every 12 (twelve) hours for 2 days.   buPROPion 300 MG 24 hr tablet Commonly known as: WELLBUTRIN XL Take 1 tablet (300 mg total) by mouth daily. What changed:  when to take this reasons to take this   cetirizine 10 MG tablet Commonly known as: ZYRTEC TAKE 1 TABLET BY MOUTH EVERY DAY AS NEEDED FOR ALLERGY What changed: See the new instructions.   clonazePAM 0.5 MG tablet Commonly known as: KLONOPIN Take 1 tablet (0.5 mg total) by mouth 2 (two) times daily.   DULoxetine 60 MG capsule Commonly known as: CYMBALTA Take 1 capsule (60 mg total) by mouth daily.   Eliquis 5 MG Tabs tablet Generic drug: apixaban TAKE 1 TABLET BY MOUTH TWICE A DAY What changed: how much to take   Folivane-Plus Caps TAKE ONE CAPSULE BY MOUTH IN THE EARLY MORNING What changed: See the new instructions.   furosemide 40 MG tablet Commonly known as: LASIX Take one tablet (40 mg) by mouth every other day alternating with two tablets (80 mg) on the opposite days. What changed:  how much to take how to take this when to take this   losartan 50 MG tablet Commonly known as: COZAAR TAKE 1 TABLET BY MOUTH EVERY DAY   metoprolol tartrate 50 MG tablet Commonly known as: LOPRESSOR Take 1 tablet (50 mg total) by mouth daily. What changed:  medication strength how much to take when to take this   One-A-Day Womens 50+ Tabs Take 1 tablet by mouth in the morning.   pantoprazole 40 MG tablet Commonly known as: PROTONIX Take 1 tablet (40 mg total) by mouth 2 (two) times daily.   polyethylene  glycol 17 g packet Commonly known as: MIRALAX / GLYCOLAX Take 17 g by mouth daily. Start taking on: June 07, 2023   potassium chloride SA 20 MEQ tablet Commonly known as: KLOR-CON M Take 20 mEq by mouth daily.   REFRESH TEARS OP Place 1 drop into both eyes 2 (two) times daily.   rosuvastatin 20 MG tablet Commonly known as: CRESTOR Take 1 tablet (20 mg total) by mouth daily at 6 PM.   sodium chloride 0.65 % Soln nasal spray Commonly known as: OCEAN Place 1 spray into both nostrils at bedtime.   spironolactone 25 MG tablet Commonly known as: ALDACTONE NEW PRESCRIPTION REQUEST: TAKE ONE TABLET BY MOUTH DAILY What changed: See the new instructions.        Contact information for after-discharge care     Destination     [facility], [place] Preferred SNF .   Service: Skilled  Nursing Contact information: [address] [place] [address] [phone]                    Allergies  Allergen Reactions   Tape Other (See Comments)    Band aides, adhesive tape; Redness and pulls skin off   Tramadol Other (See Comments)    Pt has prolonged Qtc interval of 540- cannot give tramadol per pharmacy   Morphine Itching, Rash and Other (See Comments)    Flushing     Other Procedures/Studies: CT Angio Abd/Pel w/ and/or w/o  Result Date: 06/05/2023 CLINICAL DATA:  87 year old female with a history of colitis, possible ischemia EXAM: CTA ABDOMEN AND PELVIS WITHOUT AND WITH CONTRAST TECHNIQUE: Multidetector CT imaging of the abdomen and pelvis was performed using the standard protocol during bolus administration of intravenous contrast. Multiplanar reconstructed images and MIPs were obtained and reviewed to evaluate the vascular anatomy. RADIATION DOSE REDUCTION: This exam was performed according to the departmental dose-optimization program which includes automated exposure control, adjustment of the mA and/or kV according to patient size  and/or use of iterative reconstruction technique. CONTRAST:  75mL OMNIPAQUE IOHEXOL 350 MG/ML SOLN COMPARISON:  06/01/2023 FINDINGS: VASCULAR Aorta: Mild atherosclerotic changes of the distal thoracic aorta. Diameter at the hiatus measures 18 mm. Mild to moderate atherosclerotic changes of the abdominal aorta. No dissection, aneurysm, periaortic fluid or inflammatory changes. Celiac: Celiac artery patent without high-grade stenosis. No significant atherosclerotic changes at the origin. Branch vessels are patent. SMA: SMA patent without high-grade stenosis at the origin. Minimal atherosclerosis. Branches are patent. Renals: - Right: Single right renal artery. Mild atherosclerosis changes at the origin without high-grade stenosis. Beading evident within the mid segment of the right renal artery. - Left: Single left renal artery. No significant atherosclerotic changes. No beading evident within the left renal artery distribution. IMA: Inferior mesenteric artery is patent. Right lower extremity: Unremarkable course, caliber, and contour of the right iliac system. Mild atherosclerotic changes without high-grade stenosis. No aneurysm, dissection, or occlusion. Hypogastric artery is patent. Common femoral artery patent. Proximal SFA and profunda femoris patent. Left lower extremity: Unremarkable course, caliber, and contour of the left iliac system. Mild atherosclerotic changes without high-grade stenosis. No aneurysm, dissection, or occlusion. Hypogastric artery is patent. Common femoral artery patent. Proximal SFA and profunda femoris patent. Veins: Unremarkable appearance of the venous system. Review of the MIP images confirms the above findings. NON-VASCULAR Lower chest: Trace right-sided pleural effusion. Scarring/atelectasis bilateral lung bases. Hepatobiliary: Unremarkable appearance of the liver. Hyperdense material layered dependently within the gallbladder, potentially microlithiasis/sludge. Pancreas:  Unremarkable. Spleen: Unremarkable. Adrenals/Urinary Tract: - Right adrenal gland: Unremarkable - Left adrenal gland: Unremarkable. - Right kidney: No hydronephrosis, nephrolithiasis, inflammation, or ureteral dilation. 1 cm fluid density cyst in the posterosuperior cortex and the posteroinferior cortex, incompletely characterized though most likely benign Bosniak 1 cysts. - Left Kidney: No hydronephrosis, nephrolithiasis, inflammation, or ureteral dilation. No focal lesion. - Urinary Bladder: Unremarkable. Stomach/Bowel: - Stomach: Hiatal hernia. - Small bowel: Unremarkable - Appendix: Appendix is not visualized, however, no inflammatory changes are present adjacent to the cecum to indicate an appendicitis. - Colon: Compared to the CT 06/01/2023, there has been near complete resolution of the circumferential wall thickening of the distal transverse colon and the splenic flexure. There are mild residual changes of circumferential wall thickening in 2 short segments of the sigmoid colon. Diverticular disease present in the region. Mucosal enhancement is maintained on the venous phase imaging with no evidence of  hypoenhancement. Moderate to large stool burden. Lymphatic: No adenopathy. Mesenteric: No free fluid or air. No mesenteric adenopathy. Reproductive: Hysterectomy Other: No hernia.  Mild body wall edema/anasarca. Musculoskeletal: Surgical changes of prior lumbar fusion. Osteopenia. Multilevel degenerative changes of the visualized thoracolumbar spine. No displaced fracture. IMPRESSION: Resolving colitis, with near complete resolution of inflammatory changes involving the distal transverse colon and the splenic flexure. Differential diagnosis for the etiology of the colitis includes both diverticulitis (given the presence of diverticula), as well as transient ischemia (given the location in the Griffith's point/watershed zone). Currently there is no evidence of bowel ischemia. Relative paucity of mesenteric  arterial disease, without high-grade stenosis. Aortic Atherosclerosis (ICD10-I70.0). Fibromuscular dysplasia in the distribution of the right renal artery. Additional ancillary findings as above. Signed, Yvone Neu. Miachel Roux, RPVI [facility] Radiology Electronically Signed   By: Gilmer Mor D.O.   On: 06/05/2023 16:33   ECHOCARDIOGRAM COMPLETE  Result Date: 06/02/2023    ECHOCARDIOGRAM REPORT   Patient Name:   CHRISTINAMARIE TALL Date of Exam: 06/02/2023 Medical Rec #:  [id number]        Height:       61.0 in Accession #:    [id number]       Weight:       150.0 lb Date of Birth:  [birth year]         BSA:          1.671 m Patient Age:    87 years         BP:           130/59 mmHg Patient Gender: F                HR:           85 bpm. Exam Location:  Inpatient Procedure: 2D Echo, Cardiac Doppler and Color Doppler Indications:    Other abnormalities of the heart R00.8                 CAD  History:        Patient has prior history of Echocardiogram examinations, most                 recent 12/08/2022. CAD, Carotid Disease and COPD; Risk                 Factors:Non-Smoker and Dyslipidemia.  Sonographer:    Dondra Prader RVT RCS Referring Phys: Boyce Medici GONFA IMPRESSIONS  1. Left ventricular ejection fraction, by estimation, is 55 to 60%. The left ventricle has normal function. The left ventricle has no regional wall motion abnormalities. There is mild concentric left ventricular hypertrophy. Left ventricular diastolic parameters are consistent with Grade I diastolic dysfunction (impaired relaxation).  2. Right ventricular systolic function is normal. The right ventricular size is mildly enlarged. There is mildly elevated pulmonary artery systolic pressure. The estimated right ventricular systolic pressure is 44.5 mmHg.  3. Left atrial size was moderately dilated.  4. Right atrial size was moderately dilated.  5. The mitral valve is normal in structure. Mild mitral valve  regurgitation. No evidence of mitral stenosis. Moderate mitral annular calcification.  6. The tricuspid valve is abnormal. Tricuspid valve regurgitation is moderate.  7. The aortic valve is tricuspid. There is mild calcification of the aortic valve. Aortic valve regurgitation is mild. No aortic stenosis is present.  8. The inferior vena cava is normal in size with greater than 50% respiratory variability, suggesting right atrial pressure  of 3 mmHg. FINDINGS  Left Ventricle: Left ventricular ejection fraction, by estimation, is 55 to 60%. The left ventricle has normal function. The left ventricle has no regional wall motion abnormalities. The left ventricular internal cavity size was normal in size. There is  mild concentric left ventricular hypertrophy. Left ventricular diastolic parameters are consistent with Grade I diastolic dysfunction (impaired relaxation). Right Ventricle: The right ventricular size is mildly enlarged. No increase in right ventricular wall thickness. Right ventricular systolic function is normal. There is mildly elevated pulmonary artery systolic pressure. The tricuspid regurgitant velocity is 3.22 m/s, and with an assumed right atrial pressure of 3 mmHg, the estimated right ventricular systolic pressure is 44.5 mmHg. Left Atrium: Left atrial size was moderately dilated. Right Atrium: Right atrial size was moderately dilated. Pericardium: There is no evidence of pericardial effusion. Mitral Valve: The mitral valve is normal in structure. There is mild calcification of the mitral valve leaflet(s). Moderate mitral annular calcification. Mild mitral valve regurgitation. No evidence of mitral valve stenosis. Tricuspid Valve: The tricuspid valve is abnormal. Tricuspid valve regurgitation is moderate. Aortic Valve: The aortic valve is tricuspid. There is mild calcification of the aortic valve. Aortic valve regurgitation is mild. Aortic regurgitation PHT measures 624 msec. No aortic stenosis is  present. Aortic valve mean gradient measures 5.0 mmHg. Aortic valve peak gradient measures 9.5 mmHg. Aortic valve area, by VTI measures 2.09 cm. Pulmonic Valve: The pulmonic valve was normal in structure. Pulmonic valve regurgitation is not visualized. Aorta: The aortic root is normal in size and structure. Venous: The inferior vena cava is normal in size with greater than 50% respiratory variability, suggesting right atrial pressure of 3 mmHg. IAS/Shunts: No atrial level shunt detected by color flow Doppler.  LEFT VENTRICLE PLAX 2D LVIDd:         3.85 cm   Diastology LVIDs:         2.35 cm   LV e' medial:    6.00 cm/s LV PW:         1.15 cm   LV E/e' medial:  12.1 LV IVS:        1.25 cm   LV e' lateral:   4.60 cm/s LVOT diam:     1.70 cm   LV E/e' lateral: 15.8 LV SV:         57 LV SV Index:   34 LVOT Area:     2.27 cm  RIGHT VENTRICLE             IVC RV Basal diam:  5.00 cm     IVC diam: 1.20 cm RV S prime:     19.30 cm/s TAPSE (M-mode): 2.9 cm LEFT ATRIUM             Index        RIGHT ATRIUM           Index LA diam:        3.70 cm 2.21 cm/m   RA Area:     22.50 cm LA Vol (A2C):   82.0 ml 49.06 ml/m  RA Volume:   56.30 ml  33.68 ml/m LA Vol (A4C):   52.9 ml 31.65 ml/m LA Biplane Vol: 67.0 ml 40.09 ml/m  AORTIC VALVE                     PULMONIC VALVE AV Area (Vmax):    2.08 cm      PV Vmax:  1.12 m/s AV Area (Vmean):   2.12 cm      PV Peak grad:     5.0 mmHg AV Area (VTI):     2.09 cm      PR End Diast Vel: 3.88 msec AV Vmax:           154.00 cm/s AV Vmean:          103.000 cm/s AV VTI:            0.270 m AV Peak Grad:      9.5 mmHg AV Mean Grad:      5.0 mmHg LVOT Vmax:         141.00 cm/s LVOT Vmean:        96.200 cm/s LVOT VTI:          0.249 m LVOT/AV VTI ratio: 0.92 AI PHT:            624 msec AR Vena Contracta: 0.55 cm  AORTA Ao Root diam: 3.10 cm Ao Asc diam:  3.60 cm MITRAL VALVE                TRICUSPID VALVE MV Area (PHT): 3.99 cm     TR Peak grad:   41.5 mmHg MV Decel Time: 190 msec      TR Mean grad:   27.0 mmHg MV E velocity: 72.70 cm/s   TR Vmax:        322.00 cm/s MV A velocity: 132.00 cm/s  TR Vmean:       251.0 cm/s MV E/A ratio:  0.55                             SHUNTS                             Systemic VTI:  0.25 m                             Systemic Diam: 1.70 cm Dalton McleanMD Electronically signed by Wilfred Lacy Signature Date/Time: 06/02/2023/10:26:17 AM    Final    CT CHEST ABDOMEN PELVIS WO CONTRAST  Result Date: 06/01/2023 CLINICAL DATA:  87 year old female with history of sepsis. EXAM: CT CHEST, ABDOMEN AND PELVIS WITHOUT CONTRAST TECHNIQUE: Multidetector CT imaging of the chest, abdomen and pelvis was performed following the standard protocol without IV contrast. RADIATION DOSE REDUCTION: This exam was performed according to the departmental dose-optimization program which includes automated exposure control, adjustment of the mA and/or kV according to patient size and/or use of iterative reconstruction technique. COMPARISON:  CT of the abdomen and pelvis 09/23/2022. CT of the chest, abdomen and pelvis 08/17/2019. FINDINGS: CT CHEST FINDINGS Cardiovascular: Heart size is normal. There is no significant pericardial fluid, thickening or pericardial calcification. There is aortic atherosclerosis, as well as atherosclerosis of the great vessels of the mediastinum and the coronary arteries, including calcified atherosclerotic plaque in the left anterior descending and right coronary arteries. Severe calcifications of the mitral annulus. Mediastinum/Nodes: No pathologically enlarged mediastinal or hilar lymph nodes. Please note that accurate exclusion of hilar adenopathy is limited on noncontrast CT scans. Moderate to large hiatal hernia. No axillary lymphadenopathy. Lungs/Pleura: Numerous small calcified granulomas. Linear area of architectural distortion in the periphery of the right upper lobe, similar to the prior study, most compatible with an area of chronic post  infectious or  inflammatory scarring (axial image 62 of series 5). Multiple small subcentimeter ground-glass attenuation nodules scattered throughout the lungs bilaterally, many of which appear similar to the prior study in retrospect. No other larger more suspicious appearing pulmonary nodules or masses are noted. No acute consolidative airspace disease. No pleural effusions. Musculoskeletal: There are no aggressive appearing lytic or blastic lesions noted in the visualized portions of the skeleton. CT ABDOMEN PELVIS FINDINGS Hepatobiliary: No definite suspicious cystic or solid hepatic lesions are confidently identified on today's noncontrast CT examination. Unenhanced appearance of the gallbladder is unremarkable. Pancreas: No definite pancreatic mass or peripancreatic fluid collections are noted on today's noncontrast CT examination. Spleen: Unremarkable. Adrenals/Urinary Tract: 6 mm high attenuation lesion in the anterior aspect of the interpolar region of the right kidney (axial image 74 of series 3), incompletely characterized on today's noncontrast CT examination, but statistically likely a small proteinaceous/hemorrhagic cyst. Unenhanced appearance of the left kidney and bilateral adrenal glands is otherwise unremarkable in appearance. No hydroureteronephrosis. Urinary bladder is unremarkable in appearance. Stomach/Bowel: The appearance of the stomach is unremarkable. No pathologic dilatation of small bowel or colon. Extensive mural thickening is noted throughout the transverse colon where there are surrounding inflammatory changes, concerning for acute colitis. Several colonic diverticuli are also noted, predominantly in the distal descending colon, without definite focal surrounding inflammatory changes to indicate an acute diverticulitis at this time. The appendix is not confidently identified and may be surgically absent. Regardless, there are no inflammatory changes noted adjacent to the cecum to  suggest the presence of an acute appendicitis at this time. Vascular/Lymphatic: Atherosclerosis in the abdominal aorta and pelvic vasculature. No lymphadenopathy noted in the abdomen or pelvis. Reproductive: Status post hysterectomy. Ovaries are not confidently identified may be surgically absent or atrophic. Other: No significant volume of ascites.  No pneumoperitoneum. Musculoskeletal: Rod and screw fixation hardware throughout the lumbar spine. There are no aggressive appearing lytic or blastic lesions noted in the visualized portions of the skeleton. IMPRESSION: 1. Extensive mural thickening and surrounding inflammatory changes associated with the transverse colon, concerning for acute colitis. 2. No acute findings are noted in the thorax to account for the patient's symptoms. 3. Mild colonic diverticulosis without definite findings to suggest acute diverticulitis at this time. 4. Indeterminate 6 mm high attenuation lesion in the right kidney, strongly favored to represent a proteinaceous/hemorrhagic cysts, but not characterized on today's noncontrast CT examination. This could be definitively characterized with nonemergent abdominal MRI with and without IV gadolinium if of clinical concern. 5. Moderate to large hiatal hernia. 6. Aortic atherosclerosis, in addition to left anterior descending and right coronary artery disease. 7. There are calcifications of the mitral annulus. Echocardiographic correlation for evaluation of potential valvular dysfunction may be warranted if clinically indicated. Electronically Signed   By: Trudie Reed M.D.   On: 06/01/2023 11:15   CT Head Wo Contrast  Result Date: 06/01/2023 CLINICAL DATA:  Stuck on the toilet, unable to get up for several hours, with subsequent purple legs and feet. EXAM: CT HEAD WITHOUT CONTRAST TECHNIQUE: Contiguous axial images were obtained from the base of the skull through the vertex without intravenous contrast. RADIATION DOSE REDUCTION: This  exam was performed according to the departmental dose-optimization program which includes automated exposure control, adjustment of the mA and/or kV according to patient size and/or use of iterative reconstruction technique. COMPARISON:  Mar 04, 2005 FINDINGS: Brain: There is generalized cerebral atrophy with widening of the extra-axial spaces and ventricular dilatation. There are areas of decreased  attenuation within the white matter tracts of the supratentorial brain, consistent with microvascular disease changes. Vascular: Moderate severity bilateral cavernous carotid artery calcification is noted. Skull: Normal. Negative for fracture or focal lesion. Sinuses/Orbits: No acute finding. Other: None. IMPRESSION: 1. Generalized cerebral atrophy with chronic white matter small vessel ischemic changes. 2. No acute intracranial abnormality. Electronically Signed   By: Aram Candela M.D.   On: 06/01/2023 04:27   DG Chest Port 1 View  Result Date: 06/01/2023 CLINICAL DATA:  Syncope EXAM: PORTABLE CHEST 1 VIEW COMPARISON:  None Available. FINDINGS: The heart size and mediastinal contours are within normal limits. Both lungs are clear. The visualized skeletal structures are unremarkable. IMPRESSION: No active disease. Electronically Signed   By: Deatra Robinson M.D.   On: 06/01/2023 02:28     TODAY-DAY OF DISCHARGE:  Subjective:   Josanne Wiley today has no headache,no chest abdominal pain,no new weakness tingling or numbness, feels much better wants to go home today.   Objective:   Blood pressure (!) 164/69, pulse 86, temperature 98.3 F (36.8 C), temperature source Oral, resp. rate 15, height 5\' 1"  (1.549 m), weight 72.4 kg, SpO2 96%.  Intake/Output Summary (Last 24 hours) at 06/06/2023 0941 Last data filed at 06/05/2023 1409 Gross per 24 hour  Intake 420 ml  Output --  Net 420 ml   Filed Weights   06/03/23 0500 06/05/23 2355 06/06/23 0904  Weight: 71.6 kg 74.1 kg 72.4 kg    Exam: Awake Alert,  Oriented *3, No new F.N deficits, Normal affect [place].AT,PERRAL Supple Neck,No JVD, No cervical lymphadenopathy appriciated.  Symmetrical Chest wall movement, Good air movement bilaterally, CTAB RRR,No Gallops,Rubs or new Murmurs, No Parasternal Heave +ve B.Sounds, Abd Soft, Non tender, No organomegaly appriciated, No rebound -guarding or rigidity. No Cyanosis, Clubbing or edema, No new Rash or bruise   PERTINENT RADIOLOGIC STUDIES: CT Angio Abd/Pel w/ and/or w/o  Result Date: 06/05/2023 CLINICAL DATA:  87 year old female with a history of colitis, possible ischemia EXAM: CTA ABDOMEN AND PELVIS WITHOUT AND WITH CONTRAST TECHNIQUE: Multidetector CT imaging of the abdomen and pelvis was performed using the standard protocol during bolus administration of intravenous contrast. Multiplanar reconstructed images and MIPs were obtained and reviewed to evaluate the vascular anatomy. RADIATION DOSE REDUCTION: This exam was performed according to the departmental dose-optimization program which includes automated exposure control, adjustment of the mA and/or kV according to patient size and/or use of iterative reconstruction technique. CONTRAST:  75mL OMNIPAQUE IOHEXOL 350 MG/ML SOLN COMPARISON:  06/01/2023 FINDINGS: VASCULAR Aorta: Mild atherosclerotic changes of the distal thoracic aorta. Diameter at the hiatus measures 18 mm. Mild to moderate atherosclerotic changes of the abdominal aorta. No dissection, aneurysm, periaortic fluid or inflammatory changes. Celiac: Celiac artery patent without high-grade stenosis. No significant atherosclerotic changes at the origin. Branch vessels are patent. SMA: SMA patent without high-grade stenosis at the origin. Minimal atherosclerosis. Branches are patent. Renals: - Right: Single right renal artery. Mild atherosclerosis changes at the origin without high-grade stenosis. Beading evident within the mid segment of the right renal artery. - Left: Single left renal artery. No  significant atherosclerotic changes. No beading evident within the left renal artery distribution. IMA: Inferior mesenteric artery is patent. Right lower extremity: Unremarkable course, caliber, and contour of the right iliac system. Mild atherosclerotic changes without high-grade stenosis. No aneurysm, dissection, or occlusion. Hypogastric artery is patent. Common femoral artery patent. Proximal SFA and profunda femoris patent. Left lower extremity: Unremarkable course, caliber, and contour of the left iliac  system. Mild atherosclerotic changes without high-grade stenosis. No aneurysm, dissection, or occlusion. Hypogastric artery is patent. Common femoral artery patent. Proximal SFA and profunda femoris patent. Veins: Unremarkable appearance of the venous system. Review of the MIP images confirms the above findings. NON-VASCULAR Lower chest: Trace right-sided pleural effusion. Scarring/atelectasis bilateral lung bases. Hepatobiliary: Unremarkable appearance of the liver. Hyperdense material layered dependently within the gallbladder, potentially microlithiasis/sludge. Pancreas: Unremarkable. Spleen: Unremarkable. Adrenals/Urinary Tract: - Right adrenal gland: Unremarkable - Left adrenal gland: Unremarkable. - Right kidney: No hydronephrosis, nephrolithiasis, inflammation, or ureteral dilation. 1 cm fluid density cyst in the posterosuperior cortex and the posteroinferior cortex, incompletely characterized though most likely benign Bosniak 1 cysts. - Left Kidney: No hydronephrosis, nephrolithiasis, inflammation, or ureteral dilation. No focal lesion. - Urinary Bladder: Unremarkable. Stomach/Bowel: - Stomach: Hiatal hernia. - Small bowel: Unremarkable - Appendix: Appendix is not visualized, however, no inflammatory changes are present adjacent to the cecum to indicate an appendicitis. - Colon: Compared to the CT 06/01/2023, there has been near complete resolution of the circumferential wall thickening of the distal  transverse colon and the splenic flexure. There are mild residual changes of circumferential wall thickening in 2 short segments of the sigmoid colon. Diverticular disease present in the region. Mucosal enhancement is maintained on the venous phase imaging with no evidence of hypoenhancement. Moderate to large stool burden. Lymphatic: No adenopathy. Mesenteric: No free fluid or air. No mesenteric adenopathy. Reproductive: Hysterectomy Other: No hernia.  Mild body wall edema/anasarca. Musculoskeletal: Surgical changes of prior lumbar fusion. Osteopenia. Multilevel degenerative changes of the visualized thoracolumbar spine. No displaced fracture. IMPRESSION: Resolving colitis, with near complete resolution of inflammatory changes involving the distal transverse colon and the splenic flexure. Differential diagnosis for the etiology of the colitis includes both diverticulitis (given the presence of diverticula), as well as transient ischemia (given the location in the Griffith's point/watershed zone). Currently there is no evidence of bowel ischemia. Relative paucity of mesenteric arterial disease, without high-grade stenosis. Aortic Atherosclerosis (ICD10-I70.0). Fibromuscular dysplasia in the distribution of the right renal artery. Additional ancillary findings as above. Signed, Yvone Neu. Miachel Roux, RPVI [facility] Electronically Signed   By: Gilmer Mor D.O.   On: 06/05/2023 16:33     PERTINENT LAB RESULTS: CBC: Recent Labs    06/05/23 0624 06/06/23 0109  WBC 10.6* 12.0*  HGB 10.9* 10.9*  HCT 33.8* 32.6*  PLT 228 161   CMET CMP     Component Value Date/Time   NA 136 06/06/2023 0109   NA 138 04/04/2023 1542   K 4.0 06/06/2023 0109   CL 103 06/06/2023 0109   CO2 21 (L) 06/06/2023 0109   GLUCOSE 84 06/06/2023 0109   BUN 8 06/06/2023 0109   BUN 27 04/04/2023 1542   CREATININE 0.85 06/06/2023 0109   CREATININE 0.98 12/31/2022  1041   CALCIUM 8.0 (L) 06/06/2023 0109   PROT 6.2 (L) 06/01/2023 0154   PROT 6.7 12/29/2019 1008   ALBUMIN 3.4 (L) 06/01/2023 0154   ALBUMIN 3.9 12/29/2019 1008   AST 31 06/01/2023 0154   AST 20 12/31/2022 1041   ALT 25 06/01/2023 0154   ALT 14 12/31/2022 1041   ALKPHOS 179 (H) 06/01/2023 0154   BILITOT 1.1 06/01/2023 0154   BILITOT 0.5 12/31/2022 1041   GFR 60.96 12/13/2022 1115   EGFR 55 (L) 04/04/2023 1542   GFRNONAA >60 06/06/2023 0109   GFRNONAA 57 (L) 12/31/2022 1041    GFR Estimated Creatinine Clearance: 44  mL/min (by C-G formula based on SCr of 0.85 mg/dL). No results for input(s): "LIPASE", "AMYLASE" in the last 72 hours. No results for input(s): "CKTOTAL", "CKMB", "CKMBINDEX", "TROPONINI" in the last 72 hours. Invalid input(s): "POCBNP" No results for input(s): "DDIMER" in the last 72 hours. No results for input(s): "HGBA1C" in the last 72 hours. No results for input(s): "CHOL", "HDL", "LDLCALC", "TRIG", "CHOLHDL", "LDLDIRECT" in the last 72 hours. No results for input(s): "TSH", "T4TOTAL", "T3FREE", "THYROIDAB" in the last 72 hours.  Invalid input(s): "FREET3" No results for input(s): "VITAMINB12", "FOLATE", "FERRITIN", "TIBC", "IRON", "RETICCTPCT" in the last 72 hours. Coags: No results for input(s): "INR" in the last 72 hours.  Invalid input(s): "PT" Microbiology: Recent Results (from the past 240 hour(s))  Culture, blood (Routine X 2) w Reflex to ID Panel     Status: None   Collection Time: 06/01/23 10:09 AM   Specimen: BLOOD RIGHT HAND  Result Value Ref Range Status   Specimen Description BLOOD RIGHT HAND  Final   Special Requests NONE  Final   Culture   Final    NO GROWTH 5 DAYS Performed at [facility], [address], [place], [place] [address]    Report Status 06/06/2023 FINAL  Final  Culture, blood (Routine X 2) w Reflex to ID Panel     Status: None   Collection Time: 06/01/23 10:14 AM   Specimen: BLOOD RIGHT HAND  Result Value Ref Range  Status   Specimen Description BLOOD RIGHT HAND  Final   Special Requests   Final    BOTTLES DRAWN AEROBIC AND ANAEROBIC Blood Culture results may not be optimal due to an inadequate volume of blood received in culture bottles   Culture   Final    NO GROWTH 5 DAYS Performed at [facility], [address] [place], [place] [address]    Report Status 06/06/2023 FINAL  Final  MRSA Next Gen by PCR, Nasal     Status: None   Collection Time: 06/01/23 10:45 AM   Specimen: Nasal Swab  Result Value Ref Range Status   MRSA by PCR Next Gen NOT DETECTED NOT DETECTED Final    Comment: (NOTE) The GeneXpert MRSA Assay (FDA approved for NASAL specimens only), is one component of a comprehensive MRSA colonization surveillance program. It is not intended to diagnose MRSA infection nor to guide or monitor treatment for MRSA infections. Test performance is not FDA approved in patients less than 34 years old. Performed at [facility], [address], [place], [place] [address]     FURTHER DISCHARGE INSTRUCTIONS:  Get Medicines reviewed and adjusted: Please take all your medications with you for your next visit with your Primary MD  Laboratory/radiological data: Please request your Primary MD to go over all hospital tests and procedure/radiological results at the follow up, please ask your Primary MD to get all Hospital records sent to his/her office.  In some cases, they will be blood work, cultures and biopsy results pending at the time of your discharge. Please request that your primary care M.D. goes through all the records of your hospital data and follows up on these results.  Also Note the following: If you experience worsening of your admission symptoms, develop shortness of breath, life threatening emergency, suicidal or homicidal thoughts you must seek medical attention immediately by calling 911 or calling your MD immediately  if symptoms less severe.  You must read complete  instructions/literature along with all the possible adverse  reactions/side effects for all the Medicines you take and that have been prescribed to you. Take any new Medicines after you have completely understood and accpet all the possible adverse reactions/side effects.   Do not drive when taking Pain medications or sleeping medications (Benzodaizepines)  Do not take more than prescribed Pain, Sleep and Anxiety Medications. It is not advisable to combine anxiety,sleep and pain medications without talking with your primary care practitioner  Special Instructions: If you have smoked or chewed Tobacco  in the last 2 yrs please stop smoking, stop any regular Alcohol  and or any Recreational drug use.  Wear Seat belts while driving.  Please note: You were cared for by a hospitalist during your hospital stay. Once you are discharged, your primary care physician will handle any further medical issues. Please note that NO REFILLS for any discharge medications will be authorized once you are discharged, as it is imperative that you return to your primary care physician (or establish a relationship with a primary care physician if you do not have one) for your post hospital discharge needs so that they can reassess your need for medications and monitor your lab values.  Total Time spent coordinating discharge including counseling, education and face to face time equals greater than 30 minutes.  SignedJeoffrey Massed 06/06/2023 9:41 AM

## 2023-06-08 DIAGNOSIS — D6869 Other thrombophilia: Secondary | ICD-10-CM | POA: Diagnosis not present

## 2023-06-08 DIAGNOSIS — I509 Heart failure, unspecified: Secondary | ICD-10-CM | POA: Diagnosis not present

## 2023-06-08 DIAGNOSIS — J449 Chronic obstructive pulmonary disease, unspecified: Secondary | ICD-10-CM | POA: Diagnosis not present

## 2023-06-08 DIAGNOSIS — I480 Paroxysmal atrial fibrillation: Secondary | ICD-10-CM | POA: Diagnosis not present

## 2023-06-09 ENCOUNTER — Telehealth: Payer: Self-pay

## 2023-06-09 NOTE — Telephone Encounter (Signed)
-----   Message from Nurse Orrie Lascano P sent at 06/06/2023  3:48 PM EDT -----  ----- Message ----- From: Leta Baptist, PA-C Sent: 06/06/2023   3:08 PM EDT To: Loretha Stapler, RN  Patient needs OV follow-up with either Dr. [name], Marchelle Folks, or myself (in that order) within the next 2 months if possible.  She was discharged today so will need to be contacted.  It is for hospital visit follow-up of colitis/diverticulitis.  Thank you,  Jess

## 2023-06-09 NOTE — Telephone Encounter (Signed)
Appt made to see Dr [name] on 10/9 at 4 pm for follow up. Letter mailed with appt information.

## 2023-06-13 ENCOUNTER — Other Ambulatory Visit: Payer: Self-pay | Admitting: Adult Health

## 2023-06-13 ENCOUNTER — Other Ambulatory Visit: Payer: Self-pay | Admitting: Physician Assistant

## 2023-06-13 DIAGNOSIS — F419 Anxiety disorder, unspecified: Secondary | ICD-10-CM

## 2023-06-13 DIAGNOSIS — R0600 Dyspnea, unspecified: Secondary | ICD-10-CM

## 2023-06-13 DIAGNOSIS — D509 Iron deficiency anemia, unspecified: Secondary | ICD-10-CM

## 2023-06-16 NOTE — Progress Notes (Unsigned)
Office Visit Note  Patient: Julie Rice             Date of Birth: 07-May-1936           MRN: [id number]             PCP: Shirline Frees, NP Referring: Elwyn Reach Visit Date: 06/17/2023 Occupation: @GUAROCC @  Subjective:  Pain in multiple joints  History of Present Illness: Julie Rice is a 87 y.o. female who presents today for a new patient consultation.  Patient was last seen in the office on 06/07/2019 for management of osteoarthritis.  Patient continues to have ongoing pain involving multiple joints.  Her pain has been most severe in both hands, both wrists, and her left knee joint.  She had a recent follow-up visit with Dr. Charlann Boxer and had updated x-rays of the left knee which revealed end-stage osteoarthritis.  She does not plan to proceed with a knee replacement due to underlying medical conditions.  She is primarily using a walker but is currently in rehab at [facility] after a recent hospitalization.  Patient states that she was diagnosed with rheumatoid arthritis many years ago which was treated with Motrin.  When she was last seen in the office her symptoms and radiographic findings were consistent with osteoarthritis.  She has not been on any immunosuppressive agents.  She experiences intermittent swelling in the PIP joints of both hands.  She has noticed decreased grip strength and increased deformity over time.  She denies any wrist joint swelling.  She has occasional discomfort in her left foot.  She attributes most of her feet pain due to neuropathy.  She has been taking Tylenol as needed for pain relief.  She also uses arthritis gloves as needed.  Activities of Daily Living:  Patient reports morning stiffness for all day. Patient Reports nocturnal pain.  Difficulty dressing/grooming: Reports Difficulty climbing stairs: Reports Difficulty getting out of chair: Reports Difficulty using hands for taps, buttons, cutlery, and/or writing: Reports  Review of Systems   Constitutional:  Positive for fatigue.  HENT:  Positive for nose dryness. Negative for mouth sores and mouth dryness.   Eyes:  Positive for dryness. Negative for pain and visual disturbance.  Respiratory:  Positive for shortness of breath. Negative for cough, hemoptysis and difficulty breathing.   Cardiovascular:  Positive for palpitations and swelling in legs/feet. Negative for hypertension.  Gastrointestinal:  Negative for blood in stool, constipation and diarrhea.  Endocrine: Negative for increased urination.  Genitourinary:  Negative for painful urination.  Musculoskeletal:  Positive for joint pain, joint pain, joint swelling, myalgias, muscle weakness, morning stiffness, muscle tenderness and myalgias.  Skin:  Positive for hair loss. Negative for pallor, rash, nodules/bumps, skin tightness, ulcers and sensitivity to sunlight.  Allergic/Immunologic: Negative for susceptible to infections.  Neurological:  Positive for dizziness and numbness. Negative for headaches and weakness.  Hematological:  Negative for swollen glands.  Psychiatric/Behavioral:  Positive for depressed mood. Negative for sleep disturbance. The patient is nervous/anxious.     PMFS History:  Patient Active Problem List   Diagnosis Date Noted   Chronic anticoagulation 06/04/2023   Noninfectious gastroenteritis 06/03/2023   Hematochezia 06/03/2023   AKI (acute kidney injury) (HCC) 06/01/2023   Lactic acidosis 06/01/2023   Syncope 06/01/2023   Generalized weakness 09/28/2022   Temporal arteritis (HCC) 09/28/2022   Arrhythmia 09/22/2022   Idiopathic peripheral neuropathy 07/31/2022   Paroxysmal SVT (supraventricular tachycardia) 05/14/2022   Coronary artery disease involving native coronary artery  of native heart without angina pectoris 05/14/2022   Wide-complex tachycardia 04/03/2021   Atrial fibrillation (HCC) 10/15/2020   Chronic pain syndrome 02/16/2020   Fibromyalgia 02/16/2020   Myofascial pain 02/16/2020    Rheumatoid arthritis involving multiple sites (HCC) 02/16/2020   Pleural effusion, bilateral 09/08/2019   Secondary cardiomyopathy (HCC)    Elevated troponin 08/17/2019   Chronic respiratory failure with hypoxia (HCC) 08/16/2019   COPD (chronic obstructive pulmonary disease) (HCC) 08/16/2019   PAF (paroxysmal atrial fibrillation) (HCC) 08/16/2019   Community acquired pneumonia of left lung 08/07/2019   Prolonged QT interval 08/07/2019   Sepsis (HCC) 08/07/2019   Acute on chronic respiratory failure with hypoxia (HCC) 08/07/2019   Chronic heart failure with preserved ejection fraction (HFpEF) (HCC) 07/24/2019   Chest pain    DOE (dyspnea on exertion)    Asthma 04/22/2019   Abnormal CT of the chest 02/23/2019   Achalasia    Esophageal dysmotility    Loss of weight    Moderate protein-calorie malnutrition (HCC)    Hematemesis 04/06/2018   Periesophageal hiatal hernia 08/02/2016   Dysphagia 05/10/2016   Esophageal stricture 05/10/2016   Epigastric pain 05/10/2016   Generalized anxiety disorder 02/06/2012   Major depressive disorder, recurrent (HCC) 02/05/2012   Hypokalemia 11/09/2011   Nausea and vomiting in adult 11/08/2011   Dehydration 11/08/2011   Tremors of nervous system 11/08/2011   Hypothyroidism 05/10/2008   Dyslipidemia 05/10/2008   UNSPECIFIED ANEMIA 05/10/2008   CAROTID ARTERY DISEASE 05/10/2008   Transient cerebral ischemia 05/10/2008   Rectal bleed 05/10/2008   Chronic back pain 05/10/2008   OSTEOPENIA 05/10/2008   Dyspnea 05/10/2008   Upper airway cough syndrome 05/10/2008   ESOPHAGEAL STRICTURE 01/19/2008   Essential hypertension 04/29/2007   Gastroesophageal reflux disease 04/29/2007   Osteoarthritis 04/29/2007   SPONDYLOSIS 04/29/2007    Past Medical History:  Diagnosis Date   A-fib (HCC)    Anemia    Anxiety    Bipolar disorder (HCC)    Blood transfusion 1991   autologous pts own blood given    CAD (coronary artery disease)    Cataract    Chest  pain    "@ rest, lying down, w/exertion"   CHF (congestive heart failure) (HCC) 07/2020   Chronic back pain    "mostly lower back but I do have upper back pain regularly" (04/06/2018)   COPD (chronic obstructive pulmonary disease) (HCC)    Depression    Esophageal stricture    Fibromyalgia    GERD (gastroesophageal reflux disease)    Heart murmur    "slight" (04/06/2018)   Hiatal hernia    Hyperlipidemia    Patient denies   Hypertension    Internal hemorrhoids    Lumbago    Mitral regurgitation    Osteoarthritis    Osteoporosis    Pneumonia ~ 01/2018   PONV (postoperative nausea and vomiting)    severe ponv, "in the past" (04/06/2018)   Rectal bleeding    Rheumatoid arthritis (HCC)    Spondylosis    TIA (transient ischemic attack) 2013   Urinary incontinence    wears depends    Family History  Problem Relation Age of Onset   Kidney disease Mother    Hypertension Mother    Heart disease Father 38       die of MI at age 74   Heart disease Brother    Heart attack Brother    Suicidality Son    Other Son  MVA   Colon cancer Neg Hx    Esophageal cancer Neg Hx    Pancreatic cancer Neg Hx    Rectal cancer Neg Hx    Stomach cancer Neg Hx    Past Surgical History:  Procedure Laterality Date   ABDOMINAL HYSTERECTOMY  1982   BACK SURGERY     BALLOON DILATION N/A 09/21/2018   Procedure: BALLOON DILATION;  Surgeon: Hilarie Fredrickson, MD;  Location: [facility] ENDOSCOPY;  Service: Endoscopy;  Laterality: N/A;   BALLOON DILATION N/A 08/12/2019   Procedure: BALLOON DILATION;  Surgeon: Lynann Bologna, MD;  Location: [facility] ENDOSCOPY;  Service: Endoscopy;  Laterality: N/A;   BIOPSY  08/12/2019   Procedure: BIOPSY;  Surgeon: Lynann Bologna, MD;  Location: [facility] ENDOSCOPY;  Service: Endoscopy;;   BLADDER SUSPENSION  1980's   BOTOX INJECTION N/A 09/21/2018   Procedure: BOTOX INJECTION;  Surgeon: Hilarie Fredrickson, MD;  Location: [facility] ENDOSCOPY;  Service: Endoscopy;  Laterality: N/A;   BOTOX INJECTION N/A  08/12/2019   Procedure: BOTOX INJECTION;  Surgeon: Lynann Bologna, MD;  Location: [facility] ENDOSCOPY;  Service: Endoscopy;  Laterality: N/A;   BOTOX INJECTION N/A 09/27/2022   Procedure: BOTOX INJECTION;  Surgeon: Sherrilyn Rist, MD;  Location: [facility] ENDOSCOPY;  Service: Gastroenterology;  Laterality: N/A;   BOTOX INJECTION N/A 02/24/2023   Procedure: BOTOX INJECTION;  Surgeon: Hilarie Fredrickson, MD;  Location: [facility] ENDOSCOPY;  Service: Gastroenterology;  Laterality: N/A;   CATARACT EXTRACTION W/ INTRAOCULAR LENS  IMPLANT, BILATERAL Bilateral 2010   DILATION AND CURETTAGE OF UTERUS  1961   ESOPHAGEAL MANOMETRY N/A 03/25/2018   Procedure: ESOPHAGEAL MANOMETRY (EM);  Surgeon: Napoleon Form, MD;  Location: [facility] ENDOSCOPY;  Service: Endoscopy;  Laterality: N/A;   ESOPHAGOGASTRODUODENOSCOPY (EGD) WITH PROPOFOL N/A 04/07/2018   Procedure: ESOPHAGOGASTRODUODENOSCOPY (EGD) WITH PROPOFOL;  Surgeon: Sherrilyn Rist, MD;  Location: [facility] ENDOSCOPY;  Service: Gastroenterology;  Laterality: N/A;   ESOPHAGOGASTRODUODENOSCOPY (EGD) WITH PROPOFOL N/A 09/21/2018   Procedure: ESOPHAGOGASTRODUODENOSCOPY (EGD) WITH PROPOFOL;  Surgeon: Hilarie Fredrickson, MD;  Location: [facility] ENDOSCOPY;  Service: Endoscopy;  Laterality: N/A;   ESOPHAGOGASTRODUODENOSCOPY (EGD) WITH PROPOFOL N/A 08/12/2019   Procedure: ESOPHAGOGASTRODUODENOSCOPY (EGD) WITH PROPOFOL;  Surgeon: Lynann Bologna, MD;  Location: [facility] ENDOSCOPY;  Service: Endoscopy;  Laterality: N/A;   ESOPHAGOGASTRODUODENOSCOPY (EGD) WITH PROPOFOL N/A 09/27/2022   Procedure: ESOPHAGOGASTRODUODENOSCOPY (EGD) WITH PROPOFOL;  Surgeon: Sherrilyn Rist, MD;  Location: [facility] ENDOSCOPY;  Service: Gastroenterology;  Laterality: N/A;   ESOPHAGOGASTRODUODENOSCOPY (EGD) WITH PROPOFOL N/A 02/24/2023   Procedure: ESOPHAGOGASTRODUODENOSCOPY (EGD) WITH PROPOFOL;  Surgeon: Hilarie Fredrickson, MD;  Location: [facility] ENDOSCOPY;  Service: Gastroenterology;  Laterality: N/A;   HERNIA REPAIR     HIATAL HERNIA REPAIR N/A 08/02/2016    Procedure: LAPAROSCOPIC REPAIR OF LARGE  HIATAL HERNIA;  Surgeon: Glenna Fellows, MD;  Location: [facility] ORS;  Service: General;  Laterality: N/A;   JOINT REPLACEMENT     LAPAROSCOPIC NISSEN FUNDOPLICATION N/A 08/02/2016   Procedure: LAPAROSCOPIC NISSEN FUNDOPLICATION;  Surgeon: Glenna Fellows, MD;  Location: [facility] ORS;  Service: General;  Laterality: N/A;   LUMBAR LAMINECTOMY  1990; 1994; [id number]   "I've got 2 stainless steel rods; 6 screws; 2 ray cages"took bone from right hip to put in back   RIGHT/LEFT HEART CATH AND CORONARY ANGIOGRAPHY N/A 07/26/2019   Procedure: RIGHT/LEFT HEART CATH AND CORONARY ANGIOGRAPHY;  Surgeon: Lyn Records, MD;  Location: [facility] INVASIVE CV LAB;  Service: Cardiovascular;  Laterality: N/A;   SVT ABLATION N/A 04/25/2021   Procedure: SVT  ABLATION;  Surgeon: Regan Lemming, MD;  Location: [facility] INVASIVE CV LAB;  Service: Cardiovascular;  Laterality: N/A;   TEE WITHOUT CARDIOVERSION N/A 08/19/2019   Procedure: TRANSESOPHAGEAL ECHOCARDIOGRAM (TEE);  Surgeon: Lewayne Bunting, MD;  Location: [facility] ENDOSCOPY;  Service: Cardiovascular;  Laterality: N/A;   TONSILLECTOMY AND ADENOIDECTOMY  1945   TOTAL KNEE ARTHROPLASTY Right ~ 1996   TUBAL LIGATION  ~ 1976   Social History   Social History Narrative   Retired    Widowed   Right handed   Comes with sister today   Immunization History  Administered Date(s) Administered   Fluad Quad(high Dose 65+) 07/24/2019, 10/15/2020, 07/30/2022   Influenza Whole 09/04/2007, 08/16/2008   Influenza, High Dose Seasonal PF 08/04/2018   Influenza,inj,Quad PF,6+ Mos 08/17/2013, 09/02/2014   Pneumococcal Conjugate-13 09/02/2014   Pneumococcal Polysaccharide-23 06/01/2013   Td 04/08/2006   Zoster, Live 04/08/2006     Objective: Vital Signs: BP (!) 97/59 (BP Location: Right Arm, Patient Position: Sitting, Cuff Size: Normal)   Pulse (!) 52   Resp 13   Ht 5' 2.5" (1.588 m)   Wt 150 lb (68 kg) Comment: per patient, patient in  wheelchair  BMI 27.00 kg/m    Physical Exam Vitals and nursing note reviewed.  Constitutional:      Appearance: She is well-developed.  HENT:     Head: Normocephalic and atraumatic.  Eyes:     Conjunctiva/sclera: Conjunctivae normal.  Cardiovascular:     Rate and Rhythm: Normal rate and regular rhythm.     Heart sounds: Normal heart sounds.  Pulmonary:     Effort: Pulmonary effort is normal.     Breath sounds: Normal breath sounds.  Abdominal:     General: Bowel sounds are normal.     Palpations: Abdomen is soft.  Musculoskeletal:     Cervical back: Normal range of motion.  Lymphadenopathy:     Cervical: No cervical adenopathy.  Skin:    General: Skin is warm and dry.     Capillary Refill: Capillary refill takes less than 2 seconds.  Neurological:     Mental Status: She is alert and oriented to person, place, and time.  Psychiatric:        Behavior: Behavior normal.      Musculoskeletal Exam: Patient remained in the wheelchair during the examination today.  C-spine has limited range of motion especially with lateral rotation.  Thoracic kyphosis noted.  Shoulder joints have good range of motion.  Elbow joints have good range of motion with no tenderness or inflammation.  Tenderness over both wrist joints.  CMC joint prominence noted bilaterally.  PIP and DIP thickening consistent with osteoarthritis of both hands.  Subluxation of several DIP joints.  Hip joints difficult to assess in seated position.  Right knee replacement has good range of motion.  Painful and limited extension of the left knee.  Valgus deformity of the left knee noted.  Pedal edema noted in bilateral lower extremities.  No warmth or synovitis noted in the ankle joints.  No tenderness or synovitis over MTP joints.  PIP and DIP thickening consistent with osteoarthritis of both feet.   CDAI Exam: CDAI Score: -- Patient Global: --; Provider Global: -- Swollen: --; Tender: -- Joint Exam 06/17/2023   No joint  exam has been documented for this visit   There is currently no information documented on the homunculus. Go to the Rheumatology activity and complete the homunculus joint exam.  Investigation: No additional findings.  Imaging: CT  Angio Abd/Pel w/ and/or w/o  Result Date: 06/05/2023 CLINICAL DATA:  87 year old female with a history of colitis, possible ischemia EXAM: CTA ABDOMEN AND PELVIS WITHOUT AND WITH CONTRAST TECHNIQUE: Multidetector CT imaging of the abdomen and pelvis was performed using the standard protocol during bolus administration of intravenous contrast. Multiplanar reconstructed images and MIPs were obtained and reviewed to evaluate the vascular anatomy. RADIATION DOSE REDUCTION: This exam was performed according to the departmental dose-optimization program which includes automated exposure control, adjustment of the mA and/or kV according to patient size and/or use of iterative reconstruction technique. CONTRAST:  75mL OMNIPAQUE IOHEXOL 350 MG/ML SOLN COMPARISON:  06/01/2023 FINDINGS: VASCULAR Aorta: Mild atherosclerotic changes of the distal thoracic aorta. Diameter at the hiatus measures 18 mm. Mild to moderate atherosclerotic changes of the abdominal aorta. No dissection, aneurysm, periaortic fluid or inflammatory changes. Celiac: Celiac artery patent without high-grade stenosis. No significant atherosclerotic changes at the origin. Branch vessels are patent. SMA: SMA patent without high-grade stenosis at the origin. Minimal atherosclerosis. Branches are patent. Renals: - Right: Single right renal artery. Mild atherosclerosis changes at the origin without high-grade stenosis. Beading evident within the mid segment of the right renal artery. - Left: Single left renal artery. No significant atherosclerotic changes. No beading evident within the left renal artery distribution. IMA: Inferior mesenteric artery is patent. Right lower extremity: Unremarkable course, caliber, and contour of the  right iliac system. Mild atherosclerotic changes without high-grade stenosis. No aneurysm, dissection, or occlusion. Hypogastric artery is patent. Common femoral artery patent. Proximal SFA and profunda femoris patent. Left lower extremity: Unremarkable course, caliber, and contour of the left iliac system. Mild atherosclerotic changes without high-grade stenosis. No aneurysm, dissection, or occlusion. Hypogastric artery is patent. Common femoral artery patent. Proximal SFA and profunda femoris patent. Veins: Unremarkable appearance of the venous system. Review of the MIP images confirms the above findings. NON-VASCULAR Lower chest: Trace right-sided pleural effusion. Scarring/atelectasis bilateral lung bases. Hepatobiliary: Unremarkable appearance of the liver. Hyperdense material layered dependently within the gallbladder, potentially microlithiasis/sludge. Pancreas: Unremarkable. Spleen: Unremarkable. Adrenals/Urinary Tract: - Right adrenal gland: Unremarkable - Left adrenal gland: Unremarkable. - Right kidney: No hydronephrosis, nephrolithiasis, inflammation, or ureteral dilation. 1 cm fluid density cyst in the posterosuperior cortex and the posteroinferior cortex, incompletely characterized though most likely benign Bosniak 1 cysts. - Left Kidney: No hydronephrosis, nephrolithiasis, inflammation, or ureteral dilation. No focal lesion. - Urinary Bladder: Unremarkable. Stomach/Bowel: - Stomach: Hiatal hernia. - Small bowel: Unremarkable - Appendix: Appendix is not visualized, however, no inflammatory changes are present adjacent to the cecum to indicate an appendicitis. - Colon: Compared to the CT 06/01/2023, there has been near complete resolution of the circumferential wall thickening of the distal transverse colon and the splenic flexure. There are mild residual changes of circumferential wall thickening in 2 short segments of the sigmoid colon. Diverticular disease present in the region. Mucosal enhancement  is maintained on the venous phase imaging with no evidence of hypoenhancement. Moderate to large stool burden. Lymphatic: No adenopathy. Mesenteric: No free fluid or air. No mesenteric adenopathy. Reproductive: Hysterectomy Other: No hernia.  Mild body wall edema/anasarca. Musculoskeletal: Surgical changes of prior lumbar fusion. Osteopenia. Multilevel degenerative changes of the visualized thoracolumbar spine. No displaced fracture. IMPRESSION: Resolving colitis, with near complete resolution of inflammatory changes involving the distal transverse colon and the splenic flexure. Differential diagnosis for the etiology of the colitis includes both diverticulitis (given the presence of diverticula), as well as transient ischemia (given the location in the Griffith's point/watershed  zone). Currently there is no evidence of bowel ischemia. Relative paucity of mesenteric arterial disease, without high-grade stenosis. Aortic Atherosclerosis (ICD10-I70.0). Fibromuscular dysplasia in the distribution of the right renal artery. Additional ancillary findings as above. Signed, Yvone Neu. Miachel Roux, RPVI [facility] Radiology Electronically Signed   By: Gilmer Mor D.O.   On: 06/05/2023 16:33   ECHOCARDIOGRAM COMPLETE  Result Date: 06/02/2023    ECHOCARDIOGRAM REPORT   Patient Name:   Julie Rice Date of Exam: 06/02/2023 Medical Rec #:  [id number]        Height:       61.0 in Accession #:    [id number]       Weight:       150.0 lb Date of Birth:  03/26/1936         BSA:          1.671 m Patient Age:    87 years         BP:           130/59 mmHg Patient Gender: F                HR:           85 bpm. Exam Location:  Inpatient Procedure: 2D Echo, Cardiac Doppler and Color Doppler Indications:    Other abnormalities of the heart R00.8                 CAD  History:        Patient has prior history of Echocardiogram examinations, most                 recent 12/08/2022. CAD,  Carotid Disease and COPD; Risk                 Factors:Non-Smoker and Dyslipidemia.  Sonographer:    Dondra Prader RVT RCS Referring Phys: Boyce Medici GONFA IMPRESSIONS  1. Left ventricular ejection fraction, by estimation, is 55 to 60%. The left ventricle has normal function. The left ventricle has no regional wall motion abnormalities. There is mild concentric left ventricular hypertrophy. Left ventricular diastolic parameters are consistent with Grade I diastolic dysfunction (impaired relaxation).  2. Right ventricular systolic function is normal. The right ventricular size is mildly enlarged. There is mildly elevated pulmonary artery systolic pressure. The estimated right ventricular systolic pressure is 44.5 mmHg.  3. Left atrial size was moderately dilated.  4. Right atrial size was moderately dilated.  5. The mitral valve is normal in structure. Mild mitral valve regurgitation. No evidence of mitral stenosis. Moderate mitral annular calcification.  6. The tricuspid valve is abnormal. Tricuspid valve regurgitation is moderate.  7. The aortic valve is tricuspid. There is mild calcification of the aortic valve. Aortic valve regurgitation is mild. No aortic stenosis is present.  8. The inferior vena cava is normal in size with greater than 50% respiratory variability, suggesting right atrial pressure of 3 mmHg. FINDINGS  Left Ventricle: Left ventricular ejection fraction, by estimation, is 55 to 60%. The left ventricle has normal function. The left ventricle has no regional wall motion abnormalities. The left ventricular internal cavity size was normal in size. There is  mild concentric left ventricular hypertrophy. Left ventricular diastolic parameters are consistent with Grade I diastolic dysfunction (impaired relaxation). Right Ventricle: The right ventricular size is mildly enlarged. No increase in right ventricular wall thickness. Right ventricular systolic function is normal. There is mildly elevated pulmonary  artery systolic pressure. The  tricuspid regurgitant velocity is 3.22 m/s, and with an assumed right atrial pressure of 3 mmHg, the estimated right ventricular systolic pressure is 44.5 mmHg. Left Atrium: Left atrial size was moderately dilated. Right Atrium: Right atrial size was moderately dilated. Pericardium: There is no evidence of pericardial effusion. Mitral Valve: The mitral valve is normal in structure. There is mild calcification of the mitral valve leaflet(s). Moderate mitral annular calcification. Mild mitral valve regurgitation. No evidence of mitral valve stenosis. Tricuspid Valve: The tricuspid valve is abnormal. Tricuspid valve regurgitation is moderate. Aortic Valve: The aortic valve is tricuspid. There is mild calcification of the aortic valve. Aortic valve regurgitation is mild. Aortic regurgitation PHT measures 624 msec. No aortic stenosis is present. Aortic valve mean gradient measures 5.0 mmHg. Aortic valve peak gradient measures 9.5 mmHg. Aortic valve area, by VTI measures 2.09 cm. Pulmonic Valve: The pulmonic valve was normal in structure. Pulmonic valve regurgitation is not visualized. Aorta: The aortic root is normal in size and structure. Venous: The inferior vena cava is normal in size with greater than 50% respiratory variability, suggesting right atrial pressure of 3 mmHg. IAS/Shunts: No atrial level shunt detected by color flow Doppler.  LEFT VENTRICLE PLAX 2D LVIDd:         3.85 cm   Diastology LVIDs:         2.35 cm   LV e' medial:    6.00 cm/s LV PW:         1.15 cm   LV E/e' medial:  12.1 LV IVS:        1.25 cm   LV e' lateral:   4.60 cm/s LVOT diam:     1.70 cm   LV E/e' lateral: 15.8 LV SV:         57 LV SV Index:   34 LVOT Area:     2.27 cm  RIGHT VENTRICLE             IVC RV Basal diam:  5.00 cm     IVC diam: 1.20 cm RV S prime:     19.30 cm/s TAPSE (M-mode): 2.9 cm LEFT ATRIUM             Index        RIGHT ATRIUM           Index LA diam:        3.70 cm 2.21 cm/m   RA Area:      22.50 cm LA Vol (A2C):   82.0 ml 49.06 ml/m  RA Volume:   56.30 ml  33.68 ml/m LA Vol (A4C):   52.9 ml 31.65 ml/m LA Biplane Vol: 67.0 ml 40.09 ml/m  AORTIC VALVE                     PULMONIC VALVE AV Area (Vmax):    2.08 cm      PV Vmax:          1.12 m/s AV Area (Vmean):   2.12 cm      PV Peak grad:     5.0 mmHg AV Area (VTI):     2.09 cm      PR End Diast Vel: 3.88 msec AV Vmax:           154.00 cm/s AV Vmean:          103.000 cm/s AV VTI:            0.270 m AV Peak Grad:  9.5 mmHg AV Mean Grad:      5.0 mmHg LVOT Vmax:         141.00 cm/s LVOT Vmean:        96.200 cm/s LVOT VTI:          0.249 m LVOT/AV VTI ratio: 0.92 AI PHT:            624 msec AR Vena Contracta: 0.55 cm  AORTA Ao Root diam: 3.10 cm Ao Asc diam:  3.60 cm MITRAL VALVE                TRICUSPID VALVE MV Area (PHT): 3.99 cm     TR Peak grad:   41.5 mmHg MV Decel Time: 190 msec     TR Mean grad:   27.0 mmHg MV E velocity: 72.70 cm/s   TR Vmax:        322.00 cm/s MV A velocity: 132.00 cm/s  TR Vmean:       251.0 cm/s MV E/A ratio:  0.55                             SHUNTS                             Systemic VTI:  0.25 m                             Systemic Diam: 1.70 cm Dalton McleanMD Electronically signed by Wilfred Lacy Signature Date/Time: 06/02/2023/10:26:17 AM    Final    CT CHEST ABDOMEN PELVIS WO CONTRAST  Result Date: 06/01/2023 CLINICAL DATA:  87 year old female with history of sepsis. EXAM: CT CHEST, ABDOMEN AND PELVIS WITHOUT CONTRAST TECHNIQUE: Multidetector CT imaging of the chest, abdomen and pelvis was performed following the standard protocol without IV contrast. RADIATION DOSE REDUCTION: This exam was performed according to the departmental dose-optimization program which includes automated exposure control, adjustment of the mA and/or kV according to patient size and/or use of iterative reconstruction technique. COMPARISON:  CT of the abdomen and pelvis 09/23/2022. CT of the chest, abdomen and pelvis  08/17/2019. FINDINGS: CT CHEST FINDINGS Cardiovascular: Heart size is normal. There is no significant pericardial fluid, thickening or pericardial calcification. There is aortic atherosclerosis, as well as atherosclerosis of the great vessels of the mediastinum and the coronary arteries, including calcified atherosclerotic plaque in the left anterior descending and right coronary arteries. Severe calcifications of the mitral annulus. Mediastinum/Nodes: No pathologically enlarged mediastinal or hilar lymph nodes. Please note that accurate exclusion of hilar adenopathy is limited on noncontrast CT scans. Moderate to large hiatal hernia. No axillary lymphadenopathy. Lungs/Pleura: Numerous small calcified granulomas. Linear area of architectural distortion in the periphery of the right upper lobe, similar to the prior study, most compatible with an area of chronic post infectious or inflammatory scarring (axial image 62 of series 5). Multiple small subcentimeter ground-glass attenuation nodules scattered throughout the lungs bilaterally, many of which appear similar to the prior study in retrospect. No other larger more suspicious appearing pulmonary nodules or masses are noted. No acute consolidative airspace disease. No pleural effusions. Musculoskeletal: There are no aggressive appearing lytic or blastic lesions noted in the visualized portions of the skeleton. CT ABDOMEN PELVIS FINDINGS Hepatobiliary: No definite suspicious cystic or solid hepatic lesions are confidently identified on today's noncontrast CT examination. Unenhanced appearance of the gallbladder  is unremarkable. Pancreas: No definite pancreatic mass or peripancreatic fluid collections are noted on today's noncontrast CT examination. Spleen: Unremarkable. Adrenals/Urinary Tract: 6 mm high attenuation lesion in the anterior aspect of the interpolar region of the right kidney (axial image 74 of series 3), incompletely characterized on today's noncontrast  CT examination, but statistically likely a small proteinaceous/hemorrhagic cyst. Unenhanced appearance of the left kidney and bilateral adrenal glands is otherwise unremarkable in appearance. No hydroureteronephrosis. Urinary bladder is unremarkable in appearance. Stomach/Bowel: The appearance of the stomach is unremarkable. No pathologic dilatation of small bowel or colon. Extensive mural thickening is noted throughout the transverse colon where there are surrounding inflammatory changes, concerning for acute colitis. Several colonic diverticuli are also noted, predominantly in the distal descending colon, without definite focal surrounding inflammatory changes to indicate an acute diverticulitis at this time. The appendix is not confidently identified and may be surgically absent. Regardless, there are no inflammatory changes noted adjacent to the cecum to suggest the presence of an acute appendicitis at this time. Vascular/Lymphatic: Atherosclerosis in the abdominal aorta and pelvic vasculature. No lymphadenopathy noted in the abdomen or pelvis. Reproductive: Status post hysterectomy. Ovaries are not confidently identified may be surgically absent or atrophic. Other: No significant volume of ascites.  No pneumoperitoneum. Musculoskeletal: Rod and screw fixation hardware throughout the lumbar spine. There are no aggressive appearing lytic or blastic lesions noted in the visualized portions of the skeleton. IMPRESSION: 1. Extensive mural thickening and surrounding inflammatory changes associated with the transverse colon, concerning for acute colitis. 2. No acute findings are noted in the thorax to account for the patient's symptoms. 3. Mild colonic diverticulosis without definite findings to suggest acute diverticulitis at this time. 4. Indeterminate 6 mm high attenuation lesion in the right kidney, strongly favored to represent a proteinaceous/hemorrhagic cysts, but not characterized on today's noncontrast CT  examination. This could be definitively characterized with nonemergent abdominal MRI with and without IV gadolinium if of clinical concern. 5. Moderate to large hiatal hernia. 6. Aortic atherosclerosis, in addition to left anterior descending and right coronary artery disease. 7. There are calcifications of the mitral annulus. Echocardiographic correlation for evaluation of potential valvular dysfunction may be warranted if clinically indicated. Electronically Signed   By: Trudie Reed M.D.   On: 06/01/2023 11:15   CT Head Wo Contrast  Result Date: 06/01/2023 CLINICAL DATA:  Stuck on the toilet, unable to get up for several hours, with subsequent purple legs and feet. EXAM: CT HEAD WITHOUT CONTRAST TECHNIQUE: Contiguous axial images were obtained from the base of the skull through the vertex without intravenous contrast. RADIATION DOSE REDUCTION: This exam was performed according to the departmental dose-optimization program which includes automated exposure control, adjustment of the mA and/or kV according to patient size and/or use of iterative reconstruction technique. COMPARISON:  Mar 04, 2005 FINDINGS: Brain: There is generalized cerebral atrophy with widening of the extra-axial spaces and ventricular dilatation. There are areas of decreased attenuation within the white matter tracts of the supratentorial brain, consistent with microvascular disease changes. Vascular: Moderate severity bilateral cavernous carotid artery calcification is noted. Skull: Normal. Negative for fracture or focal lesion. Sinuses/Orbits: No acute finding. Other: None. IMPRESSION: 1. Generalized cerebral atrophy with chronic white matter small vessel ischemic changes. 2. No acute intracranial abnormality. Electronically Signed   By: Aram Candela M.D.   On: 06/01/2023 04:27   DG Chest Port 1 View  Result Date: 06/01/2023 CLINICAL DATA:  Syncope EXAM: PORTABLE CHEST 1 VIEW COMPARISON:  None  Available. FINDINGS: The heart  size and mediastinal contours are within normal limits. Both lungs are clear. The visualized skeletal structures are unremarkable. IMPRESSION: No active disease. Electronically Signed   By: Deatra Robinson M.D.   On: 06/01/2023 02:28    Recent Labs: Lab Results  Component Value Date   WBC 12.0 (H) 06/06/2023   HGB 10.9 (L) 06/06/2023   PLT 161 06/06/2023   NA 136 06/06/2023   K 4.0 06/06/2023   CL 103 06/06/2023   CO2 21 (L) 06/06/2023   GLUCOSE 84 06/06/2023   BUN 8 06/06/2023   CREATININE 0.85 06/06/2023   BILITOT 1.1 06/01/2023   ALKPHOS 179 (H) 06/01/2023   AST 31 06/01/2023   ALT 25 06/01/2023   PROT 6.2 (L) 06/01/2023   ALBUMIN 3.4 (L) 06/01/2023   CALCIUM 8.0 (L) 06/06/2023   GFRAA >60 06/30/2020    Speciality Comments: No specialty comments available.  Procedures:  No procedures performed Allergies: Tape, Tramadol, and Morphine    Assessment / Plan:     Visit Diagnoses: Rheumatoid arthritis involving multiple sites, unspecified whether rheumatoid factor present (HCC) - 07/31/22:ANA negative. RF and anti-CCP negative on 05/03/19.  Questionable history of rheumatoid arthritis-patient reported-tx with motrin. No family history of RA.  Patient was previously seen in our office on 06/07/2019 at which time radiographic and clinical findings were consistent with osteoarthritis. Plan to update x-rays of both hands and feet to assess for radiographic progression.  The following lab work will also be obtained today for further evaluation. Results will be discussed at [facility].  Primary osteoarthritis of both hands - Severe in both hands- RF negative and anti-CCP negative on 05/03/19.  Patient presents today with chronic pain in both hands and both wrist joints.  She has noticed decreased grip strength as well as increased deformity in her hands.  On exam she has CMC joint prominence as well as PIP and DIP thickening consistent with osteoarthritis.  No active synovitis was noted today.  X-rays  of both hands updated today to assess for radiographic progression.  Plan to obtain the following lab work today for further evaluation.  She will continue to take Tylenol as needed for pain relief.  Plan: XR Hand 2 View Left, XR Hand 2 View Right  Pain in both hands -Patient presents today with increased pain and stiffness in both hands and both wrist joints.  She has CMC, PIP, DIP thickening consistent with osteoarthritis of both hands.  No tenderness or synovitis over MCP joints noted.  X-rays and the following lab work will be attained today for further evaluation.  Plan: XR Hand 2 View Left, XR Hand 2 View Right, Rheumatoid factor, Cyclic citrul peptide antibody, IgG, Sedimentation rate, C-reactive protein, Uric acid  Pain in both feet - She has intermittent discomfort in both feet especially the left foot.  She attributes most of her discomfort due to neuropathy.  She has PIP and DIP thickening consistent with osteoarthritis of both feet.  No tenderness or synovitis over MTP joints noted today.  X-rays of both feet updated today.  Plan: XR Foot 2 Views Right, XR Foot 2 Views Left, Rheumatoid factor, Cyclic citrul peptide antibody, IgG, Sedimentation rate, C-reactive protein, Uric acid  Elevated sed rate - ESR 53 on 07/31/22. CRP WNL.  No active synovitis was noted today.  The following lab work will be updated today.   - Plan: Rheumatoid factor, Cyclic citrul peptide antibody, IgG, Sedimentation rate, C-reactive protein, Uric acid  S/P TKR (  total knee replacement), right: Doing well overall.    Primary osteoarthritis of left knee - Dr. Claybon Jabs.  Patient had a recent x-ray which revealed severe end-stage osteoarthritis.  On examination today she has painful extension of the left knee with a valgus deformity.  Using walker at home.  Not a good surgical candidate given comorbidities.  DDD (degenerative disc disease), thoracic: Chronic pain. Thoracic kyphosis noted.   DDD (degenerative disc  disease), lumbar: Dr. Noel Gerold.  Chronic pain.  Patient has had several surgeries-1990, 1994, 1997, and 2000.   Fibromyalgia: Previously diagnosed with fibromyalgia.  Previously tried gabapentin and Lyrica.  Other medical conditions are listed as follows:  Hemispheric carotid artery syndrome  Secondary cardiomyopathy (HCC)  Paroxysmal SVT (supraventricular tachycardia)  PAF (paroxysmal atrial fibrillation) (HCC)  Essential hypertension: Blood pressure was 97/59 today in the office.  Coronary artery disease involving native coronary artery of native heart without angina pectoris  Chronic heart failure with preserved ejection fraction (HFpEF) (HCC)  Pleural effusion, bilateral  Periesophageal hiatal hernia  Chronic respiratory failure with hypoxia (HCC)  Esophageal stricture  Esophageal dysmotility  Esophageal dysphagia  Achalasia  Other specified hypothyroidism  Idiopathic peripheral neuropathy  OSTEOPENIA  Chronic pain syndrome  Chronic anticoagulation  Dyslipidemia   Orders: Orders Placed This Encounter  Procedures   XR Hand 2 View Left   XR Hand 2 View Right   XR Foot 2 Views Right   XR Foot 2 Views Left   Rheumatoid factor   Cyclic citrul peptide antibody, IgG   Sedimentation rate   C-reactive protein   Uric acid   No orders of the defined types were placed in this encounter.   Follow-Up Instructions: Return for [facility], Osteoarthritis.   Gearldine Bienenstock, PA-C  Note - This record has been created using Dragon software.  Chart creation errors have been sought, but may not always  have been located. Such creation errors do not reflect on  the standard of medical care.

## 2023-06-17 ENCOUNTER — Ambulatory Visit: Payer: Medicare Other

## 2023-06-17 ENCOUNTER — Ambulatory Visit (INDEPENDENT_AMBULATORY_CARE_PROVIDER_SITE_OTHER): Payer: Medicare Other

## 2023-06-17 ENCOUNTER — Ambulatory Visit: Payer: Medicare Other | Attending: Physician Assistant | Admitting: Physician Assistant

## 2023-06-17 ENCOUNTER — Encounter: Payer: Self-pay | Admitting: Physician Assistant

## 2023-06-17 VITALS — BP 97/59 | HR 52 | Resp 13 | Ht 62.5 in | Wt 150.0 lb

## 2023-06-17 DIAGNOSIS — J90 Pleural effusion, not elsewhere classified: Secondary | ICD-10-CM

## 2023-06-17 DIAGNOSIS — I480 Paroxysmal atrial fibrillation: Secondary | ICD-10-CM

## 2023-06-17 DIAGNOSIS — G609 Hereditary and idiopathic neuropathy, unspecified: Secondary | ICD-10-CM

## 2023-06-17 DIAGNOSIS — R1319 Other dysphagia: Secondary | ICD-10-CM

## 2023-06-17 DIAGNOSIS — M5136 Other intervertebral disc degeneration, lumbar region: Secondary | ICD-10-CM

## 2023-06-17 DIAGNOSIS — I2510 Atherosclerotic heart disease of native coronary artery without angina pectoris: Secondary | ICD-10-CM

## 2023-06-17 DIAGNOSIS — I5032 Chronic diastolic (congestive) heart failure: Secondary | ICD-10-CM

## 2023-06-17 DIAGNOSIS — M19042 Primary osteoarthritis, left hand: Secondary | ICD-10-CM

## 2023-06-17 DIAGNOSIS — M79642 Pain in left hand: Secondary | ICD-10-CM

## 2023-06-17 DIAGNOSIS — M79641 Pain in right hand: Secondary | ICD-10-CM | POA: Diagnosis not present

## 2023-06-17 DIAGNOSIS — Z96651 Presence of right artificial knee joint: Secondary | ICD-10-CM

## 2023-06-17 DIAGNOSIS — M5134 Other intervertebral disc degeneration, thoracic region: Secondary | ICD-10-CM

## 2023-06-17 DIAGNOSIS — K222 Esophageal obstruction: Secondary | ICD-10-CM

## 2023-06-17 DIAGNOSIS — M316 Other giant cell arteritis: Secondary | ICD-10-CM

## 2023-06-17 DIAGNOSIS — M069 Rheumatoid arthritis, unspecified: Secondary | ICD-10-CM | POA: Diagnosis not present

## 2023-06-17 DIAGNOSIS — M797 Fibromyalgia: Secondary | ICD-10-CM

## 2023-06-17 DIAGNOSIS — K224 Dyskinesia of esophagus: Secondary | ICD-10-CM

## 2023-06-17 DIAGNOSIS — M1712 Unilateral primary osteoarthritis, left knee: Secondary | ICD-10-CM

## 2023-06-17 DIAGNOSIS — M79671 Pain in right foot: Secondary | ICD-10-CM | POA: Diagnosis not present

## 2023-06-17 DIAGNOSIS — I10 Essential (primary) hypertension: Secondary | ICD-10-CM

## 2023-06-17 DIAGNOSIS — M19041 Primary osteoarthritis, right hand: Secondary | ICD-10-CM | POA: Diagnosis not present

## 2023-06-17 DIAGNOSIS — M899 Disorder of bone, unspecified: Secondary | ICD-10-CM

## 2023-06-17 DIAGNOSIS — M79672 Pain in left foot: Secondary | ICD-10-CM

## 2023-06-17 DIAGNOSIS — R700 Elevated erythrocyte sedimentation rate: Secondary | ICD-10-CM

## 2023-06-17 DIAGNOSIS — K220 Achalasia of cardia: Secondary | ICD-10-CM

## 2023-06-17 DIAGNOSIS — M51369 Other intervertebral disc degeneration, lumbar region without mention of lumbar back pain or lower extremity pain: Secondary | ICD-10-CM

## 2023-06-17 DIAGNOSIS — J9611 Chronic respiratory failure with hypoxia: Secondary | ICD-10-CM

## 2023-06-17 DIAGNOSIS — E038 Other specified hypothyroidism: Secondary | ICD-10-CM

## 2023-06-17 DIAGNOSIS — I429 Cardiomyopathy, unspecified: Secondary | ICD-10-CM

## 2023-06-17 DIAGNOSIS — Z7901 Long term (current) use of anticoagulants: Secondary | ICD-10-CM

## 2023-06-17 DIAGNOSIS — I471 Supraventricular tachycardia, unspecified: Secondary | ICD-10-CM

## 2023-06-17 DIAGNOSIS — K449 Diaphragmatic hernia without obstruction or gangrene: Secondary | ICD-10-CM

## 2023-06-17 DIAGNOSIS — G894 Chronic pain syndrome: Secondary | ICD-10-CM

## 2023-06-17 DIAGNOSIS — M949 Disorder of cartilage, unspecified: Secondary | ICD-10-CM

## 2023-06-17 DIAGNOSIS — G451 Carotid artery syndrome (hemispheric): Secondary | ICD-10-CM

## 2023-06-17 DIAGNOSIS — E785 Hyperlipidemia, unspecified: Secondary | ICD-10-CM

## 2023-06-17 LAB — SEDIMENTATION RATE: Sed Rate: 25 mm/h (ref 0–30)

## 2023-06-21 DIAGNOSIS — K222 Esophageal obstruction: Secondary | ICD-10-CM | POA: Diagnosis not present

## 2023-06-21 DIAGNOSIS — I447 Left bundle-branch block, unspecified: Secondary | ICD-10-CM | POA: Diagnosis not present

## 2023-06-21 DIAGNOSIS — I5032 Chronic diastolic (congestive) heart failure: Secondary | ICD-10-CM | POA: Diagnosis not present

## 2023-06-21 DIAGNOSIS — E876 Hypokalemia: Secondary | ICD-10-CM | POA: Diagnosis not present

## 2023-06-21 DIAGNOSIS — N179 Acute kidney failure, unspecified: Secondary | ICD-10-CM | POA: Diagnosis not present

## 2023-06-21 DIAGNOSIS — I480 Paroxysmal atrial fibrillation: Secondary | ICD-10-CM | POA: Diagnosis not present

## 2023-06-21 DIAGNOSIS — F411 Generalized anxiety disorder: Secondary | ICD-10-CM | POA: Diagnosis not present

## 2023-06-21 DIAGNOSIS — J9611 Chronic respiratory failure with hypoxia: Secondary | ICD-10-CM | POA: Diagnosis not present

## 2023-06-21 DIAGNOSIS — K529 Noninfective gastroenteritis and colitis, unspecified: Secondary | ICD-10-CM | POA: Diagnosis not present

## 2023-06-21 DIAGNOSIS — J449 Chronic obstructive pulmonary disease, unspecified: Secondary | ICD-10-CM | POA: Diagnosis not present

## 2023-06-21 DIAGNOSIS — M0609 Rheumatoid arthritis without rheumatoid factor, multiple sites: Secondary | ICD-10-CM | POA: Diagnosis not present

## 2023-06-21 DIAGNOSIS — I110 Hypertensive heart disease with heart failure: Secondary | ICD-10-CM | POA: Diagnosis not present

## 2023-06-21 DIAGNOSIS — K449 Diaphragmatic hernia without obstruction or gangrene: Secondary | ICD-10-CM | POA: Diagnosis not present

## 2023-06-21 DIAGNOSIS — D62 Acute posthemorrhagic anemia: Secondary | ICD-10-CM | POA: Diagnosis not present

## 2023-06-21 DIAGNOSIS — K5791 Diverticulosis of intestine, part unspecified, without perforation or abscess with bleeding: Secondary | ICD-10-CM | POA: Diagnosis not present

## 2023-06-21 DIAGNOSIS — I2510 Atherosclerotic heart disease of native coronary artery without angina pectoris: Secondary | ICD-10-CM | POA: Diagnosis not present

## 2023-06-22 NOTE — Progress Notes (Signed)
Anti-CCP is a weak positive.   Uric acid is elevated-8.6.   I would recommend scheduling an ultrasound of both hands.

## 2023-06-23 ENCOUNTER — Telehealth: Payer: Self-pay

## 2023-06-23 NOTE — Transitions of Care (Post Inpatient/ED Visit) (Unsigned)
   06/23/2023  Name: Julie Rice MRN: [id number] DOB: November 20, 1935  Today's TOC FU Call Status: Today's TOC FU Call Status:: Unsuccessful Call (1st Attempt) Unsuccessful Call (1st Attempt) Date: 06/23/23  Attempted to reach the patient regarding the most recent Inpatient/ED visit.  Follow Up Plan: Additional outreach attempts will be made to reach the patient to complete the Transitions of Care (Post Inpatient/ED visit) call.   Signature   Woodfin Ganja LPN  [facility] Nurse Health Advisor Direct Dial [phone]

## 2023-06-24 NOTE — Transitions of Care (Post Inpatient/ED Visit) (Signed)
   06/24/2023  Name: Julie Rice MRN: [id number] DOB: [birth year]  Today's TOC FU Call Status: Today's TOC FU Call Status:: Unsuccessful Call (2nd Attempt) Unsuccessful Call (1st Attempt) Date: 06/23/23 Unsuccessful Call (2nd Attempt) Date: 06/24/23  Attempted to reach the patient regarding the most recent Inpatient/ED visit.  Follow Up Plan: No further outreach attempts will be made at this time. We have been unable to contact the patient. Pt has fu appt 06-25-23  Signature  Woodfin Ganja LPN [facility] Nurse Health Advisor Direct Dial [phone]

## 2023-06-25 ENCOUNTER — Inpatient Hospital Stay: Payer: Medicare Other | Admitting: Adult Health

## 2023-06-25 DIAGNOSIS — F411 Generalized anxiety disorder: Secondary | ICD-10-CM | POA: Diagnosis not present

## 2023-06-25 DIAGNOSIS — K5791 Diverticulosis of intestine, part unspecified, without perforation or abscess with bleeding: Secondary | ICD-10-CM | POA: Diagnosis not present

## 2023-06-25 DIAGNOSIS — N179 Acute kidney failure, unspecified: Secondary | ICD-10-CM | POA: Diagnosis not present

## 2023-06-25 DIAGNOSIS — I480 Paroxysmal atrial fibrillation: Secondary | ICD-10-CM | POA: Diagnosis not present

## 2023-06-25 DIAGNOSIS — K222 Esophageal obstruction: Secondary | ICD-10-CM | POA: Diagnosis not present

## 2023-06-25 DIAGNOSIS — I5032 Chronic diastolic (congestive) heart failure: Secondary | ICD-10-CM | POA: Diagnosis not present

## 2023-06-25 DIAGNOSIS — J9611 Chronic respiratory failure with hypoxia: Secondary | ICD-10-CM | POA: Diagnosis not present

## 2023-06-25 DIAGNOSIS — I2510 Atherosclerotic heart disease of native coronary artery without angina pectoris: Secondary | ICD-10-CM | POA: Diagnosis not present

## 2023-06-25 DIAGNOSIS — I447 Left bundle-branch block, unspecified: Secondary | ICD-10-CM | POA: Diagnosis not present

## 2023-06-25 DIAGNOSIS — K529 Noninfective gastroenteritis and colitis, unspecified: Secondary | ICD-10-CM | POA: Diagnosis not present

## 2023-06-25 DIAGNOSIS — D62 Acute posthemorrhagic anemia: Secondary | ICD-10-CM | POA: Diagnosis not present

## 2023-06-25 DIAGNOSIS — M0609 Rheumatoid arthritis without rheumatoid factor, multiple sites: Secondary | ICD-10-CM | POA: Diagnosis not present

## 2023-06-25 DIAGNOSIS — I110 Hypertensive heart disease with heart failure: Secondary | ICD-10-CM | POA: Diagnosis not present

## 2023-06-25 DIAGNOSIS — E876 Hypokalemia: Secondary | ICD-10-CM | POA: Diagnosis not present

## 2023-06-25 DIAGNOSIS — J449 Chronic obstructive pulmonary disease, unspecified: Secondary | ICD-10-CM | POA: Diagnosis not present

## 2023-06-25 DIAGNOSIS — K449 Diaphragmatic hernia without obstruction or gangrene: Secondary | ICD-10-CM | POA: Diagnosis not present

## 2023-06-25 NOTE — Progress Notes (Deleted)
Subjective:    Patient ID: Julie Rice, female    DOB: Feb 26, 1936, 87 y.o.   MRN: [id number]  HPI 87 year old female who  has a past medical history of A-fib (HCC), Anemia, Anxiety, Bipolar disorder (HCC), Blood transfusion (1991), CAD (coronary artery disease), Cataract, Chest pain, CHF (congestive heart failure) (HCC) (07/2020), Chronic back pain, COPD (chronic obstructive pulmonary disease) (HCC), Depression, Esophageal stricture, Fibromyalgia, GERD (gastroesophageal reflux disease), Heart murmur, Hiatal hernia, Hyperlipidemia, Hypertension, Internal hemorrhoids, Lumbago, Mitral regurgitation, Osteoarthritis, Osteoporosis, Pneumonia (~ 01/2018), PONV (postoperative nausea and vomiting), Rectal bleeding, Rheumatoid arthritis (HCC), Spondylosis, TIA (transient ischemic attack) (2013), and Urinary incontinence.  She presents to the office today for TCM visit  Admit Date 06/01/2023 Discharge from hospital 06/06/2023 Discharge from SNF   She presented to the hospital with lower abdominal pain, weakness, possible syncope where she was apparently stuck in her toilet for a couple of hours.  She was found to be hypotensive.  In the ER her CT showed transverse colitis and she was admitted to the hospitalist service.  Hospital Course  Severe Sepsis due to infectious colitis  -She was treated with antibiotics and supportive care.  Her abdominal pain essentially resolved.  She had no diarrhea since the day of admission.  Repeat CT on 8 1 showed no evidence of mesenteric ischemia/colitis/inflammation.  She was on Rocephin/Flagyl and transition to Augmentin which was continued for additional 2 days after discharge  Presyncope/weakness -Can [place] to hypotension.  Overall this improved during hospital admission.  She worked with PT/OT which recommended skilled nursing facility  Hematochezia/mild acute blood loss anemia  -He had no further hematochezia for several days.  Hemoglobin stayed stable.  GI did  not plan on endoscopy evaluation and Eliquis was resumed resumed.  No overt bleeding noted hemoglobin again remained stable after Eliquis was resumed.  She was advised to follow-up with GI in the outpatient setting for repeat and endoscopy  AKI -In the setting of hypotension/sepsis.  Creatinine improved throughout hospital admission  Hypokalemia -Due to Lasix -He was supplemented and recheck potassium was within normal limits  COPD with chronic hypoxic respiratory failure on home O2 -Stable was continued on bronchodilators  Chronic HFpEF -Stated overall, she did have minimal left lower extremity edema which improved after restarting Lasix.  She was resumed on Aldactone on discharge  PAF -Needed with amiodarone/metoprolol.  Maintained sinus rhythm during admission  History of CAD/new LBBB -Stable.  She was continued on metoprolol statin  Hypertension  -He was continued on losartan.  Dosage of metoprolol increased on day of discharge and Aldactone was added.    Review of Systems     Objective:   Physical Exam        Assessment & Plan:

## 2023-06-26 DIAGNOSIS — E876 Hypokalemia: Secondary | ICD-10-CM | POA: Diagnosis not present

## 2023-06-26 DIAGNOSIS — N179 Acute kidney failure, unspecified: Secondary | ICD-10-CM | POA: Diagnosis not present

## 2023-06-26 DIAGNOSIS — F411 Generalized anxiety disorder: Secondary | ICD-10-CM | POA: Diagnosis not present

## 2023-06-26 DIAGNOSIS — D62 Acute posthemorrhagic anemia: Secondary | ICD-10-CM | POA: Diagnosis not present

## 2023-06-26 DIAGNOSIS — I2510 Atherosclerotic heart disease of native coronary artery without angina pectoris: Secondary | ICD-10-CM | POA: Diagnosis not present

## 2023-06-26 DIAGNOSIS — I5032 Chronic diastolic (congestive) heart failure: Secondary | ICD-10-CM | POA: Diagnosis not present

## 2023-06-26 DIAGNOSIS — K222 Esophageal obstruction: Secondary | ICD-10-CM | POA: Diagnosis not present

## 2023-06-26 DIAGNOSIS — K5791 Diverticulosis of intestine, part unspecified, without perforation or abscess with bleeding: Secondary | ICD-10-CM | POA: Diagnosis not present

## 2023-06-26 DIAGNOSIS — K529 Noninfective gastroenteritis and colitis, unspecified: Secondary | ICD-10-CM | POA: Diagnosis not present

## 2023-06-26 DIAGNOSIS — K449 Diaphragmatic hernia without obstruction or gangrene: Secondary | ICD-10-CM | POA: Diagnosis not present

## 2023-06-26 DIAGNOSIS — J449 Chronic obstructive pulmonary disease, unspecified: Secondary | ICD-10-CM | POA: Diagnosis not present

## 2023-06-26 DIAGNOSIS — I480 Paroxysmal atrial fibrillation: Secondary | ICD-10-CM | POA: Diagnosis not present

## 2023-06-26 DIAGNOSIS — I110 Hypertensive heart disease with heart failure: Secondary | ICD-10-CM | POA: Diagnosis not present

## 2023-06-26 DIAGNOSIS — M0609 Rheumatoid arthritis without rheumatoid factor, multiple sites: Secondary | ICD-10-CM | POA: Diagnosis not present

## 2023-06-26 DIAGNOSIS — J9611 Chronic respiratory failure with hypoxia: Secondary | ICD-10-CM | POA: Diagnosis not present

## 2023-06-26 DIAGNOSIS — I447 Left bundle-branch block, unspecified: Secondary | ICD-10-CM | POA: Diagnosis not present

## 2023-06-27 ENCOUNTER — Telehealth: Payer: Self-pay | Admitting: Internal Medicine

## 2023-06-27 ENCOUNTER — Telehealth: Payer: Self-pay | Admitting: Adult Health

## 2023-06-27 DIAGNOSIS — E876 Hypokalemia: Secondary | ICD-10-CM | POA: Diagnosis not present

## 2023-06-27 DIAGNOSIS — N179 Acute kidney failure, unspecified: Secondary | ICD-10-CM | POA: Diagnosis not present

## 2023-06-27 DIAGNOSIS — I110 Hypertensive heart disease with heart failure: Secondary | ICD-10-CM | POA: Diagnosis not present

## 2023-06-27 DIAGNOSIS — I480 Paroxysmal atrial fibrillation: Secondary | ICD-10-CM | POA: Diagnosis not present

## 2023-06-27 DIAGNOSIS — I5032 Chronic diastolic (congestive) heart failure: Secondary | ICD-10-CM | POA: Diagnosis not present

## 2023-06-27 DIAGNOSIS — D62 Acute posthemorrhagic anemia: Secondary | ICD-10-CM | POA: Diagnosis not present

## 2023-06-27 DIAGNOSIS — J9611 Chronic respiratory failure with hypoxia: Secondary | ICD-10-CM | POA: Diagnosis not present

## 2023-06-27 DIAGNOSIS — I2510 Atherosclerotic heart disease of native coronary artery without angina pectoris: Secondary | ICD-10-CM | POA: Diagnosis not present

## 2023-06-27 DIAGNOSIS — K529 Noninfective gastroenteritis and colitis, unspecified: Secondary | ICD-10-CM | POA: Diagnosis not present

## 2023-06-27 DIAGNOSIS — J449 Chronic obstructive pulmonary disease, unspecified: Secondary | ICD-10-CM | POA: Diagnosis not present

## 2023-06-27 DIAGNOSIS — I447 Left bundle-branch block, unspecified: Secondary | ICD-10-CM | POA: Diagnosis not present

## 2023-06-27 DIAGNOSIS — K449 Diaphragmatic hernia without obstruction or gangrene: Secondary | ICD-10-CM | POA: Diagnosis not present

## 2023-06-27 DIAGNOSIS — F411 Generalized anxiety disorder: Secondary | ICD-10-CM | POA: Diagnosis not present

## 2023-06-27 DIAGNOSIS — M0609 Rheumatoid arthritis without rheumatoid factor, multiple sites: Secondary | ICD-10-CM | POA: Diagnosis not present

## 2023-06-27 DIAGNOSIS — K5791 Diverticulosis of intestine, part unspecified, without perforation or abscess with bleeding: Secondary | ICD-10-CM | POA: Diagnosis not present

## 2023-06-27 DIAGNOSIS — K222 Esophageal obstruction: Secondary | ICD-10-CM | POA: Diagnosis not present

## 2023-06-27 MED ORDER — METOPROLOL TARTRATE 50 MG PO TABS: 50.0000 mg | ORAL_TABLET | Freq: Every day | ORAL | 3 refills | Status: DC

## 2023-06-27 NOTE — Telephone Encounter (Signed)
*  STAT* If patient is at the pharmacy, call can be transferred to refill team.   1. Which medications need to be refilled? (please list name of each medication and dose if known) metoprolol tartrate (LOPRESSOR) 50 MG tablet    2. Would you like to learn more about the convenience, safety, & potential cost savings by using the [facility]? No     3. Are you open to using the [facility] (Type [facility]. No ).   4. Which pharmacy/location (including street and city if local pharmacy) is medication to be sent to? [facility] - [address], [place] - [address]    5. Do they need a 30 day or 90 day supply? 30

## 2023-06-27 NOTE — Telephone Encounter (Signed)
Prescription sent to requesting pharmacy.

## 2023-06-27 NOTE — Telephone Encounter (Signed)
Prescription Request  06/27/2023  LOV: 04/01/2023  What is the name of the medication or equipment?  rosuvastatin (CRESTOR) 20 MG tablet DULoxetine (CYMBALTA) 60 MG capsule  Have you contacted your pharmacy to request a refill? Yes   Which pharmacy would you like this sent to?   [facility] - [address] [place] - [address] [place] [address] Phone: [phone] Fax: [phone]    Patient notified that their request is being sent to the clinical staff for review and that they should receive a response within 2 business days.   Please advise at Mobile [phone] (mobile)

## 2023-06-27 NOTE — Telephone Encounter (Signed)
OT 1x6

## 2023-06-27 NOTE — Telephone Encounter (Signed)
Okay for verbal orders? Please advise 

## 2023-06-30 DIAGNOSIS — K222 Esophageal obstruction: Secondary | ICD-10-CM | POA: Diagnosis not present

## 2023-06-30 DIAGNOSIS — F411 Generalized anxiety disorder: Secondary | ICD-10-CM | POA: Diagnosis not present

## 2023-06-30 DIAGNOSIS — I110 Hypertensive heart disease with heart failure: Secondary | ICD-10-CM | POA: Diagnosis not present

## 2023-06-30 DIAGNOSIS — I2510 Atherosclerotic heart disease of native coronary artery without angina pectoris: Secondary | ICD-10-CM | POA: Diagnosis not present

## 2023-06-30 DIAGNOSIS — I480 Paroxysmal atrial fibrillation: Secondary | ICD-10-CM | POA: Diagnosis not present

## 2023-06-30 DIAGNOSIS — M0609 Rheumatoid arthritis without rheumatoid factor, multiple sites: Secondary | ICD-10-CM | POA: Diagnosis not present

## 2023-06-30 DIAGNOSIS — J9611 Chronic respiratory failure with hypoxia: Secondary | ICD-10-CM | POA: Diagnosis not present

## 2023-06-30 DIAGNOSIS — K5791 Diverticulosis of intestine, part unspecified, without perforation or abscess with bleeding: Secondary | ICD-10-CM | POA: Diagnosis not present

## 2023-06-30 DIAGNOSIS — I5032 Chronic diastolic (congestive) heart failure: Secondary | ICD-10-CM | POA: Diagnosis not present

## 2023-06-30 DIAGNOSIS — J449 Chronic obstructive pulmonary disease, unspecified: Secondary | ICD-10-CM | POA: Diagnosis not present

## 2023-06-30 DIAGNOSIS — D62 Acute posthemorrhagic anemia: Secondary | ICD-10-CM | POA: Diagnosis not present

## 2023-06-30 DIAGNOSIS — E876 Hypokalemia: Secondary | ICD-10-CM | POA: Diagnosis not present

## 2023-06-30 DIAGNOSIS — K529 Noninfective gastroenteritis and colitis, unspecified: Secondary | ICD-10-CM | POA: Diagnosis not present

## 2023-06-30 DIAGNOSIS — I447 Left bundle-branch block, unspecified: Secondary | ICD-10-CM | POA: Diagnosis not present

## 2023-06-30 DIAGNOSIS — N179 Acute kidney failure, unspecified: Secondary | ICD-10-CM | POA: Diagnosis not present

## 2023-06-30 DIAGNOSIS — K449 Diaphragmatic hernia without obstruction or gangrene: Secondary | ICD-10-CM | POA: Diagnosis not present

## 2023-07-01 NOTE — Telephone Encounter (Signed)
Left message to return phone call.

## 2023-07-02 ENCOUNTER — Inpatient Hospital Stay: Payer: Medicare Other | Attending: Internal Medicine

## 2023-07-02 ENCOUNTER — Inpatient Hospital Stay: Payer: Medicare Other | Admitting: Internal Medicine

## 2023-07-02 NOTE — Telephone Encounter (Signed)
Verbal orders given to [facility] home [facility]

## 2023-07-03 ENCOUNTER — Other Ambulatory Visit: Payer: Self-pay

## 2023-07-03 MED ORDER — ROSUVASTATIN CALCIUM 20 MG PO TABS: 20.0000 mg | ORAL_TABLET | Freq: Every day | ORAL | 1 refills | Status: DC

## 2023-07-03 MED ORDER — DULOXETINE HCL 60 MG PO CPEP: 60.0000 mg | ORAL_CAPSULE | Freq: Every day | ORAL | 1 refills | Status: DC

## 2023-07-03 NOTE — Telephone Encounter (Signed)
Prescription sent to pharmacy. No other action needed. Pt informed! 

## 2023-07-04 DIAGNOSIS — R531 Weakness: Secondary | ICD-10-CM | POA: Diagnosis not present

## 2023-07-04 DIAGNOSIS — I480 Paroxysmal atrial fibrillation: Secondary | ICD-10-CM | POA: Diagnosis not present

## 2023-07-04 DIAGNOSIS — I2510 Atherosclerotic heart disease of native coronary artery without angina pectoris: Secondary | ICD-10-CM | POA: Diagnosis not present

## 2023-07-04 DIAGNOSIS — N179 Acute kidney failure, unspecified: Secondary | ICD-10-CM | POA: Diagnosis not present

## 2023-07-04 DIAGNOSIS — K449 Diaphragmatic hernia without obstruction or gangrene: Secondary | ICD-10-CM | POA: Diagnosis not present

## 2023-07-04 DIAGNOSIS — E876 Hypokalemia: Secondary | ICD-10-CM | POA: Diagnosis not present

## 2023-07-04 DIAGNOSIS — Z8619 Personal history of other infectious and parasitic diseases: Secondary | ICD-10-CM | POA: Diagnosis not present

## 2023-07-04 DIAGNOSIS — K222 Esophageal obstruction: Secondary | ICD-10-CM | POA: Diagnosis not present

## 2023-07-04 DIAGNOSIS — D62 Acute posthemorrhagic anemia: Secondary | ICD-10-CM | POA: Diagnosis not present

## 2023-07-04 DIAGNOSIS — K529 Noninfective gastroenteritis and colitis, unspecified: Secondary | ICD-10-CM | POA: Diagnosis not present

## 2023-07-04 DIAGNOSIS — K5791 Diverticulosis of intestine, part unspecified, without perforation or abscess with bleeding: Secondary | ICD-10-CM | POA: Diagnosis not present

## 2023-07-04 DIAGNOSIS — I5032 Chronic diastolic (congestive) heart failure: Secondary | ICD-10-CM | POA: Diagnosis not present

## 2023-07-04 DIAGNOSIS — M0609 Rheumatoid arthritis without rheumatoid factor, multiple sites: Secondary | ICD-10-CM | POA: Diagnosis not present

## 2023-07-04 DIAGNOSIS — I447 Left bundle-branch block, unspecified: Secondary | ICD-10-CM | POA: Diagnosis not present

## 2023-07-04 DIAGNOSIS — F411 Generalized anxiety disorder: Secondary | ICD-10-CM | POA: Diagnosis not present

## 2023-07-04 DIAGNOSIS — J449 Chronic obstructive pulmonary disease, unspecified: Secondary | ICD-10-CM | POA: Diagnosis not present

## 2023-07-04 DIAGNOSIS — I110 Hypertensive heart disease with heart failure: Secondary | ICD-10-CM | POA: Diagnosis not present

## 2023-07-04 DIAGNOSIS — J9611 Chronic respiratory failure with hypoxia: Secondary | ICD-10-CM | POA: Diagnosis not present

## 2023-07-08 NOTE — Progress Notes (Signed)
Office Visit Note  Patient: Julie Rice             Date of Birth: [birth year]           MRN: [id number]             PCP: Shirline Frees, NP Referring: Shirline Frees, NP Visit Date: 07/21/2023 Occupation: @GUAROCC @  Subjective:  Discuss results   History of Present Illness: Julie Rice is a 87 y.o. female with history of osteoarthritis and DDD.  Patient presents today to discuss lab results and x-ray results from the initial office visit.  Patient continues to have chronic pain involving multiple joints.  Her pain has been most severe in both hands and both feet.  She has been using a rollator walker to assist with ambulation.  She remains on Cymbalta as prescribed and has been taking Tylenol as needed for pain relief.  Her joint stiffness lasts all day.     Activities of Daily Living:  Patient reports morning stiffness for all day. Patient Reports nocturnal pain.  Difficulty dressing/grooming: Denies Difficulty climbing stairs: Reports Difficulty getting out of chair: Reports Difficulty using hands for taps, buttons, cutlery, and/or writing: Reports  Review of Systems  Constitutional:  Positive for fatigue.  HENT:  Positive for mouth dryness. Negative for mouth sores and nose dryness.   Eyes:  Positive for dryness. Negative for pain and visual disturbance.  Respiratory:  Negative for cough, hemoptysis, shortness of breath, wheezing and difficulty breathing.   Cardiovascular:  Positive for palpitations. Negative for chest pain, hypertension and swelling in legs/feet.  Gastrointestinal:  Positive for constipation. Negative for blood in stool and diarrhea.  Endocrine: Negative for increased urination.  Genitourinary:  Positive for involuntary urination. Negative for painful urination.  Musculoskeletal:  Positive for joint pain, gait problem, joint pain, joint swelling, myalgias, muscle weakness, morning stiffness, muscle tenderness and myalgias.  Skin:  Positive for color  change and hair loss. Negative for pallor, rash, nodules/bumps, skin tightness, ulcers and sensitivity to sunlight.  Allergic/Immunologic: Negative for susceptible to infections.  Neurological:  Positive for numbness. Negative for dizziness, headaches and weakness.  Hematological:  Negative for swollen glands.  Psychiatric/Behavioral:  Positive for depressed mood. Negative for sleep disturbance. The patient is nervous/anxious.     PMFS History:  Patient Active Problem List   Diagnosis Date Noted   Chronic anticoagulation 06/04/2023   Noninfectious gastroenteritis 06/03/2023   Hematochezia 06/03/2023   AKI (acute kidney injury) (HCC) 06/01/2023   Lactic acidosis 06/01/2023   Syncope 06/01/2023   Generalized weakness 09/28/2022   Temporal arteritis (HCC) 09/28/2022   Arrhythmia 09/22/2022   Idiopathic peripheral neuropathy 07/31/2022   Paroxysmal SVT (supraventricular tachycardia) 05/14/2022   Coronary artery disease involving native coronary artery of native heart without angina pectoris 05/14/2022   Wide-complex tachycardia 04/03/2021   Atrial fibrillation (HCC) 10/15/2020   Chronic pain syndrome 02/16/2020   Fibromyalgia 02/16/2020   Myofascial pain 02/16/2020   Rheumatoid arthritis involving multiple sites (HCC) 02/16/2020   Pleural effusion, bilateral 09/08/2019   Secondary cardiomyopathy (HCC)    Elevated troponin 08/17/2019   Chronic respiratory failure with hypoxia (HCC) 08/16/2019   COPD (chronic obstructive pulmonary disease) (HCC) 08/16/2019   PAF (paroxysmal atrial fibrillation) (HCC) 08/16/2019   Community acquired pneumonia of left lung 08/07/2019   Prolonged QT interval 08/07/2019   Sepsis (HCC) 08/07/2019   Acute on chronic respiratory failure with hypoxia (HCC) 08/07/2019   Chronic heart failure with preserved ejection fraction (HFpEF) (HCC)  07/24/2019   Chest pain    DOE (dyspnea on exertion)    Asthma 04/22/2019   Abnormal CT of the chest 02/23/2019    Achalasia    Esophageal dysmotility    Loss of weight    Moderate protein-calorie malnutrition (HCC)    Hematemesis 04/06/2018   Periesophageal hiatal hernia 08/02/2016   Dysphagia 05/10/2016   Esophageal stricture 05/10/2016   Epigastric pain 05/10/2016   Generalized anxiety disorder 02/06/2012   Major depressive disorder, recurrent (HCC) 02/05/2012   Hypokalemia 11/09/2011   Nausea and vomiting in adult 11/08/2011   Dehydration 11/08/2011   Tremors of nervous system 11/08/2011   Hypothyroidism 05/10/2008   Dyslipidemia 05/10/2008   UNSPECIFIED ANEMIA 05/10/2008   CAROTID ARTERY DISEASE 05/10/2008   Transient cerebral ischemia 05/10/2008   Rectal bleed 05/10/2008   Chronic back pain 05/10/2008   OSTEOPENIA 05/10/2008   Dyspnea 05/10/2008   Upper airway cough syndrome 05/10/2008   ESOPHAGEAL STRICTURE 01/19/2008   Essential hypertension 04/29/2007   Gastroesophageal reflux disease 04/29/2007   Osteoarthritis 04/29/2007   SPONDYLOSIS 04/29/2007    Past Medical History:  Diagnosis Date   A-fib (HCC)    Anemia    Anxiety    Bipolar disorder (HCC)    Blood transfusion 1991   autologous pts own blood given    CAD (coronary artery disease)    Cataract    Chest pain    "@ rest, lying down, w/exertion"   CHF (congestive heart failure) (HCC) 07/2020   Chronic back pain    "mostly lower back but I do have upper back pain regularly" (04/06/2018)   COPD (chronic obstructive pulmonary disease) (HCC)    Depression    Esophageal stricture    Fibromyalgia    GERD (gastroesophageal reflux disease)    Heart murmur    "slight" (04/06/2018)   Hiatal hernia    Hyperlipidemia    Patient denies   Hypertension    Internal hemorrhoids    Lumbago    Mitral regurgitation    Osteoarthritis    Osteoporosis    Pneumonia ~ 01/2018   PONV (postoperative nausea and vomiting)    severe ponv, "in the past" (04/06/2018)   Rectal bleeding    Rheumatoid arthritis (HCC)    Spondylosis    TIA  (transient ischemic attack) 2013   Urinary incontinence    wears depends    Family History  Problem Relation Age of Onset   Kidney disease Mother    Hypertension Mother    Heart disease Father 54       die of MI at age 70   Heart disease Brother    Heart attack Brother    Suicidality Son    Other Son        MVA   Colon cancer Neg Hx    Esophageal cancer Neg Hx    Pancreatic cancer Neg Hx    Rectal cancer Neg Hx    Stomach cancer Neg Hx    Past Surgical History:  Procedure Laterality Date   ABDOMINAL HYSTERECTOMY  1982   BACK SURGERY     BALLOON DILATION N/A 09/21/2018   Procedure: BALLOON DILATION;  Surgeon: Hilarie Fredrickson, MD;  Location: [facility] ENDOSCOPY;  Service: Endoscopy;  Laterality: N/A;   BALLOON DILATION N/A 08/12/2019   Procedure: BALLOON DILATION;  Surgeon: Lynann Bologna, MD;  Location: [facility] ENDOSCOPY;  Service: Endoscopy;  Laterality: N/A;   BIOPSY  08/12/2019   Procedure: BIOPSY;  Surgeon: Lynann Bologna, MD;  Location:  [facility] ENDOSCOPY;  Service: Endoscopy;;   BLADDER SUSPENSION  1980's   BOTOX INJECTION N/A 09/21/2018   Procedure: BOTOX INJECTION;  Surgeon: Hilarie Fredrickson, MD;  Location: [facility] ENDOSCOPY;  Service: Endoscopy;  Laterality: N/A;   BOTOX INJECTION N/A 08/12/2019   Procedure: BOTOX INJECTION;  Surgeon: Lynann Bologna, MD;  Location: [facility] ENDOSCOPY;  Service: Endoscopy;  Laterality: N/A;   BOTOX INJECTION N/A 09/27/2022   Procedure: BOTOX INJECTION;  Surgeon: Sherrilyn Rist, MD;  Location: [facility] ENDOSCOPY;  Service: Gastroenterology;  Laterality: N/A;   BOTOX INJECTION N/A 02/24/2023   Procedure: BOTOX INJECTION;  Surgeon: Hilarie Fredrickson, MD;  Location: [facility] ENDOSCOPY;  Service: Gastroenterology;  Laterality: N/A;   CATARACT EXTRACTION W/ INTRAOCULAR LENS  IMPLANT, BILATERAL Bilateral 2010   DILATION AND CURETTAGE OF UTERUS  1961   ESOPHAGEAL MANOMETRY N/A 03/25/2018   Procedure: ESOPHAGEAL MANOMETRY (EM);  Surgeon: Napoleon Form, MD;  Location: [facility] ENDOSCOPY;   Service: Endoscopy;  Laterality: N/A;   ESOPHAGOGASTRODUODENOSCOPY (EGD) WITH PROPOFOL N/A 04/07/2018   Procedure: ESOPHAGOGASTRODUODENOSCOPY (EGD) WITH PROPOFOL;  Surgeon: Sherrilyn Rist, MD;  Location: [facility] ENDOSCOPY;  Service: Gastroenterology;  Laterality: N/A;   ESOPHAGOGASTRODUODENOSCOPY (EGD) WITH PROPOFOL N/A 09/21/2018   Procedure: ESOPHAGOGASTRODUODENOSCOPY (EGD) WITH PROPOFOL;  Surgeon: Hilarie Fredrickson, MD;  Location: [facility] ENDOS[facility];  Service: Endoscopy;  Laterality: N/A;   ESOPHAGOGASTRODUODENOSCOPY (EGD) WITH PROPOFOL N/A 08/12/2019   Procedure: ESOPHAGOGASTRODUODENOSCOPY (EGD) WITH PROPOFOL;  Surgeon: Lynann Bologna, MD;  Location: [facility] ENDOSCOPY;  Service: Endoscopy;  Laterality: N/A;   ESOPHAGOGASTRODUODENOSCOPY (EGD) WITH PROPOFOL N/A 09/27/2022   Procedure: ESOPHAGOGASTRODUODENOSCOPY (EGD) WITH PROPOFOL;  Surgeon: Sherrilyn Rist, MD;  Location: [facility] ENDOSCOPY;  Service: Gastroenterology;  Laterality: N/A;   ESOPHAGOGASTRODUODENOSCOPY (EGD) WITH PROPOFOL N/A 02/24/2023   Procedure: ESOPHAGOGASTRODUODENOSCOPY (EGD) WITH PROPOFOL;  Surgeon: Hilarie Fredrickson, MD;  Location: [facility] ENDOSCOPY;  Service: Gastroenterology;  Laterality: N/A;   HERNIA REPAIR     HIATAL HERNIA REPAIR N/A 08/02/2016   Procedure: LAPAROSCOPIC REPAIR OF LARGE  HIATAL HERNIA;  Surgeon: Glenna Fellows, MD;  Location: [facility] ORS;  Service: General;  Laterality: N/A;   JOINT REPLACEMENT     LAPAROSCOPIC NISSEN FUNDOPLICATION N/A 08/02/2016   Procedure: LAPAROSCOPIC NISSEN FUNDOPLICATION;  Surgeon: Glenna Fellows, MD;  Location: [facility] ORS;  Service: General;  Laterality: N/A;   LUMBAR LAMINECTOMY  1990; 1994; [id number]   "I've got 2 stainless steel rods; 6 screws; 2 ray cages"took bone from right hip to put in back   RIGHT/LEFT HEART CATH AND CORONARY ANGIOGRAPHY N/A 07/26/2019   Procedure: RIGHT/LEFT HEART CATH AND CORONARY ANGIOGRAPHY;  Surgeon: Lyn Records, MD;  Location: [facility] INVASIVE CV LAB;  Service: Cardiovascular;   Laterality: N/A;   SVT ABLATION N/A 04/25/2021   Procedure: SVT ABLATION;  Surgeon: Regan Lemming, MD;  Location: [facility] CV [facility];  Service: Cardiovascular;  Laterality: N/A;   TEE WITHOUT CARDIOVERSION N/A 08/19/2019   Procedure: TRANSESOPHAGEAL ECHOCARDIOGRAM (TEE);  Surgeon: Lewayne Bunting, MD;  Location: [facility] ENDOSCOPY;  Service: Cardiovascular;  Laterality: N/A;   TONSILLECTOMY AND ADENOIDECTOMY  1945   TOTAL KNEE ARTHROPLASTY Right ~ 1996   TUBAL LIGATION  ~ 1976   Social History   Social History Narrative   Retired    Widowed   Right handed   Comes with sister today   Immunization History  Administered Date(s) Administered   Fluad Quad(high Dose 65+) 07/24/2019, 10/15/2020, 07/30/2022   Influenza Whole 09/04/2007, 08/16/2008   Influenza, High Dose Seasonal PF  08/04/2018   Influenza,inj,Quad PF,6+ Mos 08/17/2013, 09/02/2014   Pneumococcal Conjugate-13 09/02/2014   Pneumococcal Polysaccharide-23 06/01/2013   Td 04/08/2006   Zoster, Live 04/08/2006     Objective: Vital Signs: BP 117/71 (BP Location: Left Arm, Patient Position: Sitting, Cuff Size: Normal)   Pulse (!) 57   Resp 14   Ht 5' 2.5" (1.588 m)   Wt 153 lb 6.4 oz (69.6 kg)   BMI 27.61 kg/m    Physical Exam Vitals and nursing note reviewed.  Constitutional:      Appearance: She is well-developed.  HENT:     Head: Normocephalic and atraumatic.  Eyes:     Conjunctiva/sclera: Conjunctivae normal.  Cardiovascular:     Rate and Rhythm: Normal rate and regular rhythm.     Heart sounds: Normal heart sounds.  Pulmonary:     Effort: Pulmonary effort is normal.     Breath sounds: Normal breath sounds.  Abdominal:     General: Bowel sounds are normal.     Palpations: Abdomen is soft.  Musculoskeletal:     Cervical back: Normal range of motion.  Lymphadenopathy:     Cervical: No cervical adenopathy.  Skin:    General: Skin is warm and dry.     Capillary Refill: Capillary refill takes less than 2  seconds.  Neurological:     Mental Status: She is alert and oriented to person, place, and time.  Psychiatric:        Behavior: Behavior normal.      Musculoskeletal Exam: Patient remained seated during examination today.  C-spine has limited ROM.  Thoracic kyphosis.  Shoulder joints have good ROM.  Elbow joints have good ROM with no tenderness or joint swelling. Tenderness of both wrist joints.  CMC joint prominence bilaterally.  PIP and DIP thickening consistent with OA of both hands.  Subluxation of several DIP joints.  Right knee replacement has good ROM.  Left knee has painful and limited extension.  Valgus deformity of left knee.  Ankle joints have good ROM with no tenderness or joint swelling.  Pedal edema noted bilaterally.   CDAI Exam: CDAI Score: -- Patient Global: --; Provider Global: -- Swollen: --; Tender: -- Joint Exam 07/21/2023   No joint exam has been documented for this visit   There is currently no information documented on the homunculus. Go to the Rheumatology activity and complete the homunculus joint exam.  Investigation: No additional findings.  Imaging: No results found.  Recent Labs: Lab Results  Component Value Date   WBC 12.0 (H) 06/06/2023   HGB 10.9 (L) 06/06/2023   PLT 161 06/06/2023   NA 136 06/06/2023   K 4.0 06/06/2023   CL 103 06/06/2023   CO2 21 (L) 06/06/2023   GLUCOSE 84 06/06/2023   BUN 8 06/06/2023   CREATININE 0.85 06/06/2023   BILITOT 1.1 06/01/2023   ALKPHOS 179 (H) 06/01/2023   AST 31 06/01/2023   ALT 25 06/01/2023   PROT 6.2 (L) 06/01/2023   ALBUMIN 3.4 (L) 06/01/2023   CALCIUM 8.0 (L) 06/06/2023   GFRAA >60 06/30/2020    Speciality Comments: No specialty comments available.  Procedures:  No procedures performed Allergies: Tape, Tramadol, and Morphine   Assessment / Plan:     Visit Diagnoses: Rheumatoid arthritis of multiple sites with negative rheumatoid factor (HCC) - 07/31/22: ANA negative. RF and anti-CCP negative  on 05/03/19.  Questionable history of rheumatoid arthritis-patient reported-tx with motrin. No fhx of RA.  Reviewed x-ray results of both hands  and both feet from her initial office visit on 06/17/2023.  No radiographic progression was noted when compared to x-rays from 2020.  X-rays were consistent with osteoarthritis of both hands and both feet.  Findings were severe in both hands and revealed possible inflammatory arthritis or crystal induced arthropathy.  Reviewed lab results including sed rate and CRP within normal limits on 06/17/2023.  Rheumatoid factor was negative and anti-CCP was a week positive: 26. Briefly discussed possible use of Plaquenil.  She may not be a good candidate for colchicine due to taking amiodarone. She is not a good candidate for NSAID use given that she is on Eliquis. Plan to obtain an ultrasound of both hands to assess for synovitis.  Elevated sed rate: ESR and CRP WNL on 06/17/23.   Primary osteoarthritis of both hands - Severe in both hands- RF negative and anti-CCP negative on 05/03/19.  Anti-CCP was a weak positive: 26 on 06/17/2023.  RF negative, sed rate within normal limits, CRP within normal limits.  CMC, PIP, and DIP thickening consistent with osteoarthritis of both hands.  Patient continues to have chronic pain and stiffness involving both hands.  She has difficulty performing ADLs at times. Plan to schedule an ultrasound of both hands.   S/P TKR (total knee replacement), right: Doing well.  Good ROM with no effusion.   Primary osteoarthritis of left knee - Dr. Claybon Jabs.  Patient had a recent x-ray which revealed severe end-stage osteoarthritis. Valgus deformity.  Painful ROM noted on exam.    Primary osteoarthritis of both feet: X-rays of both feet consistent with OA.  No erosive changes.    DDD (degenerative disc disease), thoracic: Chronic pain.   DDD (degenerative disc disease), lumbar - Dr. Noel Gerold.  Chronic pain.  Patient has had several surgeries-1990,  1994, 1997, and 2000.  Fibromyalgia - Previously tried gabapentin and Lyrica.  She is taking cymbalta 60 mg 1 capsule daily.   Other medical conditions are listed as follows:   Hemispheric carotid artery syndrome  Secondary cardiomyopathy (HCC)  Paroxysmal SVT (supraventricular tachycardia)  PAF (paroxysmal atrial fibrillation) (HCC)  Essential hypertension: BP was 117/71 today in the office.   Coronary artery disease involving native coronary artery of native heart without angina pectoris  Chronic heart failure with preserved ejection fraction (HFpEF) (HCC)  Pleural effusion, bilateral  Periesophageal hiatal hernia  Chronic respiratory failure with hypoxia (HCC)  Esophageal stricture  Esophageal dysmotility  Esophageal dysphagia  Achalasia  Orders: No orders of the defined types were placed in this encounter.  No orders of the defined types were placed in this encounter.   Follow-Up Instructions: Return for Osteoarthritis.   Gearldine Bienenstock, PA-C  Note - This record has been created using Dragon software.  Chart creation errors have been sought, but may not always  have been located. Such creation errors do not reflect on  the standard of medical care.

## 2023-07-09 DIAGNOSIS — I2510 Atherosclerotic heart disease of native coronary artery without angina pectoris: Secondary | ICD-10-CM | POA: Diagnosis not present

## 2023-07-09 DIAGNOSIS — J449 Chronic obstructive pulmonary disease, unspecified: Secondary | ICD-10-CM | POA: Diagnosis not present

## 2023-07-09 DIAGNOSIS — E876 Hypokalemia: Secondary | ICD-10-CM | POA: Diagnosis not present

## 2023-07-09 DIAGNOSIS — D62 Acute posthemorrhagic anemia: Secondary | ICD-10-CM | POA: Diagnosis not present

## 2023-07-09 DIAGNOSIS — K222 Esophageal obstruction: Secondary | ICD-10-CM | POA: Diagnosis not present

## 2023-07-09 DIAGNOSIS — I5032 Chronic diastolic (congestive) heart failure: Secondary | ICD-10-CM | POA: Diagnosis not present

## 2023-07-09 DIAGNOSIS — I110 Hypertensive heart disease with heart failure: Secondary | ICD-10-CM | POA: Diagnosis not present

## 2023-07-09 DIAGNOSIS — N179 Acute kidney failure, unspecified: Secondary | ICD-10-CM | POA: Diagnosis not present

## 2023-07-09 DIAGNOSIS — K5791 Diverticulosis of intestine, part unspecified, without perforation or abscess with bleeding: Secondary | ICD-10-CM | POA: Diagnosis not present

## 2023-07-09 DIAGNOSIS — K449 Diaphragmatic hernia without obstruction or gangrene: Secondary | ICD-10-CM | POA: Diagnosis not present

## 2023-07-09 DIAGNOSIS — K529 Noninfective gastroenteritis and colitis, unspecified: Secondary | ICD-10-CM | POA: Diagnosis not present

## 2023-07-09 DIAGNOSIS — M0609 Rheumatoid arthritis without rheumatoid factor, multiple sites: Secondary | ICD-10-CM | POA: Diagnosis not present

## 2023-07-09 DIAGNOSIS — F411 Generalized anxiety disorder: Secondary | ICD-10-CM | POA: Diagnosis not present

## 2023-07-09 DIAGNOSIS — I480 Paroxysmal atrial fibrillation: Secondary | ICD-10-CM | POA: Diagnosis not present

## 2023-07-09 DIAGNOSIS — I447 Left bundle-branch block, unspecified: Secondary | ICD-10-CM | POA: Diagnosis not present

## 2023-07-09 DIAGNOSIS — J9611 Chronic respiratory failure with hypoxia: Secondary | ICD-10-CM | POA: Diagnosis not present

## 2023-07-11 ENCOUNTER — Other Ambulatory Visit: Payer: Self-pay | Admitting: Physician Assistant

## 2023-07-11 DIAGNOSIS — I110 Hypertensive heart disease with heart failure: Secondary | ICD-10-CM | POA: Diagnosis not present

## 2023-07-11 DIAGNOSIS — K222 Esophageal obstruction: Secondary | ICD-10-CM | POA: Diagnosis not present

## 2023-07-11 DIAGNOSIS — J449 Chronic obstructive pulmonary disease, unspecified: Secondary | ICD-10-CM | POA: Diagnosis not present

## 2023-07-11 DIAGNOSIS — I5032 Chronic diastolic (congestive) heart failure: Secondary | ICD-10-CM | POA: Diagnosis not present

## 2023-07-11 DIAGNOSIS — K529 Noninfective gastroenteritis and colitis, unspecified: Secondary | ICD-10-CM | POA: Diagnosis not present

## 2023-07-11 DIAGNOSIS — D62 Acute posthemorrhagic anemia: Secondary | ICD-10-CM | POA: Diagnosis not present

## 2023-07-11 DIAGNOSIS — I480 Paroxysmal atrial fibrillation: Secondary | ICD-10-CM | POA: Diagnosis not present

## 2023-07-11 DIAGNOSIS — M0609 Rheumatoid arthritis without rheumatoid factor, multiple sites: Secondary | ICD-10-CM | POA: Diagnosis not present

## 2023-07-11 DIAGNOSIS — E876 Hypokalemia: Secondary | ICD-10-CM | POA: Diagnosis not present

## 2023-07-11 DIAGNOSIS — I2510 Atherosclerotic heart disease of native coronary artery without angina pectoris: Secondary | ICD-10-CM | POA: Diagnosis not present

## 2023-07-11 DIAGNOSIS — J9611 Chronic respiratory failure with hypoxia: Secondary | ICD-10-CM | POA: Diagnosis not present

## 2023-07-11 DIAGNOSIS — I447 Left bundle-branch block, unspecified: Secondary | ICD-10-CM | POA: Diagnosis not present

## 2023-07-11 DIAGNOSIS — K5791 Diverticulosis of intestine, part unspecified, without perforation or abscess with bleeding: Secondary | ICD-10-CM | POA: Diagnosis not present

## 2023-07-11 DIAGNOSIS — K449 Diaphragmatic hernia without obstruction or gangrene: Secondary | ICD-10-CM | POA: Diagnosis not present

## 2023-07-11 DIAGNOSIS — N179 Acute kidney failure, unspecified: Secondary | ICD-10-CM | POA: Diagnosis not present

## 2023-07-11 DIAGNOSIS — F411 Generalized anxiety disorder: Secondary | ICD-10-CM | POA: Diagnosis not present

## 2023-07-14 DIAGNOSIS — I447 Left bundle-branch block, unspecified: Secondary | ICD-10-CM | POA: Diagnosis not present

## 2023-07-14 DIAGNOSIS — K529 Noninfective gastroenteritis and colitis, unspecified: Secondary | ICD-10-CM | POA: Diagnosis not present

## 2023-07-14 DIAGNOSIS — E876 Hypokalemia: Secondary | ICD-10-CM | POA: Diagnosis not present

## 2023-07-14 DIAGNOSIS — K5791 Diverticulosis of intestine, part unspecified, without perforation or abscess with bleeding: Secondary | ICD-10-CM | POA: Diagnosis not present

## 2023-07-14 DIAGNOSIS — K222 Esophageal obstruction: Secondary | ICD-10-CM | POA: Diagnosis not present

## 2023-07-14 DIAGNOSIS — M0609 Rheumatoid arthritis without rheumatoid factor, multiple sites: Secondary | ICD-10-CM | POA: Diagnosis not present

## 2023-07-14 DIAGNOSIS — K449 Diaphragmatic hernia without obstruction or gangrene: Secondary | ICD-10-CM | POA: Diagnosis not present

## 2023-07-14 DIAGNOSIS — I480 Paroxysmal atrial fibrillation: Secondary | ICD-10-CM | POA: Diagnosis not present

## 2023-07-14 DIAGNOSIS — N179 Acute kidney failure, unspecified: Secondary | ICD-10-CM | POA: Diagnosis not present

## 2023-07-14 DIAGNOSIS — I5032 Chronic diastolic (congestive) heart failure: Secondary | ICD-10-CM | POA: Diagnosis not present

## 2023-07-14 DIAGNOSIS — D62 Acute posthemorrhagic anemia: Secondary | ICD-10-CM | POA: Diagnosis not present

## 2023-07-14 DIAGNOSIS — F411 Generalized anxiety disorder: Secondary | ICD-10-CM | POA: Diagnosis not present

## 2023-07-14 DIAGNOSIS — I110 Hypertensive heart disease with heart failure: Secondary | ICD-10-CM | POA: Diagnosis not present

## 2023-07-14 DIAGNOSIS — I2510 Atherosclerotic heart disease of native coronary artery without angina pectoris: Secondary | ICD-10-CM | POA: Diagnosis not present

## 2023-07-14 DIAGNOSIS — J9611 Chronic respiratory failure with hypoxia: Secondary | ICD-10-CM | POA: Diagnosis not present

## 2023-07-14 DIAGNOSIS — J449 Chronic obstructive pulmonary disease, unspecified: Secondary | ICD-10-CM | POA: Diagnosis not present

## 2023-07-16 ENCOUNTER — Other Ambulatory Visit: Payer: Self-pay | Admitting: Physician Assistant

## 2023-07-16 ENCOUNTER — Other Ambulatory Visit: Payer: Self-pay | Admitting: Adult Health

## 2023-07-16 ENCOUNTER — Telehealth: Payer: Self-pay | Admitting: Adult Health

## 2023-07-16 DIAGNOSIS — I5032 Chronic diastolic (congestive) heart failure: Secondary | ICD-10-CM | POA: Diagnosis not present

## 2023-07-16 DIAGNOSIS — I447 Left bundle-branch block, unspecified: Secondary | ICD-10-CM | POA: Diagnosis not present

## 2023-07-16 DIAGNOSIS — J449 Chronic obstructive pulmonary disease, unspecified: Secondary | ICD-10-CM | POA: Diagnosis not present

## 2023-07-16 DIAGNOSIS — I2510 Atherosclerotic heart disease of native coronary artery without angina pectoris: Secondary | ICD-10-CM | POA: Diagnosis not present

## 2023-07-16 DIAGNOSIS — K222 Esophageal obstruction: Secondary | ICD-10-CM | POA: Diagnosis not present

## 2023-07-16 DIAGNOSIS — K529 Noninfective gastroenteritis and colitis, unspecified: Secondary | ICD-10-CM | POA: Diagnosis not present

## 2023-07-16 DIAGNOSIS — K5791 Diverticulosis of intestine, part unspecified, without perforation or abscess with bleeding: Secondary | ICD-10-CM | POA: Diagnosis not present

## 2023-07-16 DIAGNOSIS — E876 Hypokalemia: Secondary | ICD-10-CM | POA: Diagnosis not present

## 2023-07-16 DIAGNOSIS — J9611 Chronic respiratory failure with hypoxia: Secondary | ICD-10-CM | POA: Diagnosis not present

## 2023-07-16 DIAGNOSIS — M0609 Rheumatoid arthritis without rheumatoid factor, multiple sites: Secondary | ICD-10-CM | POA: Diagnosis not present

## 2023-07-16 DIAGNOSIS — N179 Acute kidney failure, unspecified: Secondary | ICD-10-CM | POA: Diagnosis not present

## 2023-07-16 DIAGNOSIS — I480 Paroxysmal atrial fibrillation: Secondary | ICD-10-CM | POA: Diagnosis not present

## 2023-07-16 DIAGNOSIS — D62 Acute posthemorrhagic anemia: Secondary | ICD-10-CM | POA: Diagnosis not present

## 2023-07-16 DIAGNOSIS — F411 Generalized anxiety disorder: Secondary | ICD-10-CM | POA: Diagnosis not present

## 2023-07-16 DIAGNOSIS — K449 Diaphragmatic hernia without obstruction or gangrene: Secondary | ICD-10-CM | POA: Diagnosis not present

## 2023-07-16 DIAGNOSIS — I110 Hypertensive heart disease with heart failure: Secondary | ICD-10-CM | POA: Diagnosis not present

## 2023-07-16 NOTE — Telephone Encounter (Signed)
Checking on faxes sent late August and early this month, requesting info for Plan of Care, physical therapy and to make up a PT appointment. Requesting fax [phone]

## 2023-07-17 DIAGNOSIS — E876 Hypokalemia: Secondary | ICD-10-CM | POA: Diagnosis not present

## 2023-07-17 DIAGNOSIS — I447 Left bundle-branch block, unspecified: Secondary | ICD-10-CM | POA: Diagnosis not present

## 2023-07-17 DIAGNOSIS — I480 Paroxysmal atrial fibrillation: Secondary | ICD-10-CM | POA: Diagnosis not present

## 2023-07-17 DIAGNOSIS — M0609 Rheumatoid arthritis without rheumatoid factor, multiple sites: Secondary | ICD-10-CM | POA: Diagnosis not present

## 2023-07-17 DIAGNOSIS — J9611 Chronic respiratory failure with hypoxia: Secondary | ICD-10-CM | POA: Diagnosis not present

## 2023-07-17 DIAGNOSIS — K222 Esophageal obstruction: Secondary | ICD-10-CM | POA: Diagnosis not present

## 2023-07-17 DIAGNOSIS — K529 Noninfective gastroenteritis and colitis, unspecified: Secondary | ICD-10-CM | POA: Diagnosis not present

## 2023-07-17 DIAGNOSIS — K449 Diaphragmatic hernia without obstruction or gangrene: Secondary | ICD-10-CM | POA: Diagnosis not present

## 2023-07-17 DIAGNOSIS — N179 Acute kidney failure, unspecified: Secondary | ICD-10-CM | POA: Diagnosis not present

## 2023-07-17 DIAGNOSIS — J449 Chronic obstructive pulmonary disease, unspecified: Secondary | ICD-10-CM | POA: Diagnosis not present

## 2023-07-17 DIAGNOSIS — I5032 Chronic diastolic (congestive) heart failure: Secondary | ICD-10-CM | POA: Diagnosis not present

## 2023-07-17 DIAGNOSIS — K5791 Diverticulosis of intestine, part unspecified, without perforation or abscess with bleeding: Secondary | ICD-10-CM | POA: Diagnosis not present

## 2023-07-17 DIAGNOSIS — I2510 Atherosclerotic heart disease of native coronary artery without angina pectoris: Secondary | ICD-10-CM | POA: Diagnosis not present

## 2023-07-17 DIAGNOSIS — I110 Hypertensive heart disease with heart failure: Secondary | ICD-10-CM | POA: Diagnosis not present

## 2023-07-17 DIAGNOSIS — D62 Acute posthemorrhagic anemia: Secondary | ICD-10-CM | POA: Diagnosis not present

## 2023-07-17 DIAGNOSIS — F411 Generalized anxiety disorder: Secondary | ICD-10-CM | POA: Diagnosis not present

## 2023-07-17 NOTE — Telephone Encounter (Signed)
This was faxeed already. Tried to call but no answer.

## 2023-07-21 ENCOUNTER — Encounter: Payer: Self-pay | Admitting: Physician Assistant

## 2023-07-21 ENCOUNTER — Ambulatory Visit: Payer: Medicare Other | Attending: Physician Assistant | Admitting: Physician Assistant

## 2023-07-21 VITALS — BP 117/71 | HR 57 | Resp 14 | Ht 62.5 in | Wt 153.4 lb

## 2023-07-21 DIAGNOSIS — M51369 Other intervertebral disc degeneration, lumbar region without mention of lumbar back pain or lower extremity pain: Secondary | ICD-10-CM

## 2023-07-21 DIAGNOSIS — M19071 Primary osteoarthritis, right ankle and foot: Secondary | ICD-10-CM

## 2023-07-21 DIAGNOSIS — K224 Dyskinesia of esophagus: Secondary | ICD-10-CM

## 2023-07-21 DIAGNOSIS — R1319 Other dysphagia: Secondary | ICD-10-CM

## 2023-07-21 DIAGNOSIS — M19042 Primary osteoarthritis, left hand: Secondary | ICD-10-CM

## 2023-07-21 DIAGNOSIS — R700 Elevated erythrocyte sedimentation rate: Secondary | ICD-10-CM | POA: Diagnosis not present

## 2023-07-21 DIAGNOSIS — Z96651 Presence of right artificial knee joint: Secondary | ICD-10-CM

## 2023-07-21 DIAGNOSIS — I10 Essential (primary) hypertension: Secondary | ICD-10-CM

## 2023-07-21 DIAGNOSIS — M19041 Primary osteoarthritis, right hand: Secondary | ICD-10-CM

## 2023-07-21 DIAGNOSIS — K222 Esophageal obstruction: Secondary | ICD-10-CM

## 2023-07-21 DIAGNOSIS — I480 Paroxysmal atrial fibrillation: Secondary | ICD-10-CM

## 2023-07-21 DIAGNOSIS — M5134 Other intervertebral disc degeneration, thoracic region: Secondary | ICD-10-CM

## 2023-07-21 DIAGNOSIS — G451 Carotid artery syndrome (hemispheric): Secondary | ICD-10-CM

## 2023-07-21 DIAGNOSIS — M797 Fibromyalgia: Secondary | ICD-10-CM

## 2023-07-21 DIAGNOSIS — K449 Diaphragmatic hernia without obstruction or gangrene: Secondary | ICD-10-CM

## 2023-07-21 DIAGNOSIS — I471 Supraventricular tachycardia, unspecified: Secondary | ICD-10-CM

## 2023-07-21 DIAGNOSIS — I429 Cardiomyopathy, unspecified: Secondary | ICD-10-CM

## 2023-07-21 DIAGNOSIS — M19072 Primary osteoarthritis, left ankle and foot: Secondary | ICD-10-CM

## 2023-07-21 DIAGNOSIS — M5136 Other intervertebral disc degeneration, lumbar region: Secondary | ICD-10-CM

## 2023-07-21 DIAGNOSIS — J9611 Chronic respiratory failure with hypoxia: Secondary | ICD-10-CM

## 2023-07-21 DIAGNOSIS — K220 Achalasia of cardia: Secondary | ICD-10-CM

## 2023-07-21 DIAGNOSIS — M1712 Unilateral primary osteoarthritis, left knee: Secondary | ICD-10-CM

## 2023-07-21 DIAGNOSIS — I5032 Chronic diastolic (congestive) heart failure: Secondary | ICD-10-CM

## 2023-07-21 DIAGNOSIS — M069 Rheumatoid arthritis, unspecified: Secondary | ICD-10-CM

## 2023-07-21 DIAGNOSIS — I2510 Atherosclerotic heart disease of native coronary artery without angina pectoris: Secondary | ICD-10-CM

## 2023-07-21 DIAGNOSIS — J90 Pleural effusion, not elsewhere classified: Secondary | ICD-10-CM

## 2023-07-21 DIAGNOSIS — M0609 Rheumatoid arthritis without rheumatoid factor, multiple sites: Secondary | ICD-10-CM

## 2023-07-22 DIAGNOSIS — K529 Noninfective gastroenteritis and colitis, unspecified: Secondary | ICD-10-CM | POA: Diagnosis not present

## 2023-07-22 DIAGNOSIS — I2510 Atherosclerotic heart disease of native coronary artery without angina pectoris: Secondary | ICD-10-CM | POA: Diagnosis not present

## 2023-07-22 DIAGNOSIS — J449 Chronic obstructive pulmonary disease, unspecified: Secondary | ICD-10-CM | POA: Diagnosis not present

## 2023-07-22 DIAGNOSIS — K5791 Diverticulosis of intestine, part unspecified, without perforation or abscess with bleeding: Secondary | ICD-10-CM | POA: Diagnosis not present

## 2023-07-22 DIAGNOSIS — J9611 Chronic respiratory failure with hypoxia: Secondary | ICD-10-CM | POA: Diagnosis not present

## 2023-07-22 DIAGNOSIS — K222 Esophageal obstruction: Secondary | ICD-10-CM | POA: Diagnosis not present

## 2023-07-22 DIAGNOSIS — I447 Left bundle-branch block, unspecified: Secondary | ICD-10-CM | POA: Diagnosis not present

## 2023-07-22 DIAGNOSIS — D62 Acute posthemorrhagic anemia: Secondary | ICD-10-CM | POA: Diagnosis not present

## 2023-07-22 DIAGNOSIS — K449 Diaphragmatic hernia without obstruction or gangrene: Secondary | ICD-10-CM | POA: Diagnosis not present

## 2023-07-22 DIAGNOSIS — I5032 Chronic diastolic (congestive) heart failure: Secondary | ICD-10-CM | POA: Diagnosis not present

## 2023-07-22 DIAGNOSIS — N179 Acute kidney failure, unspecified: Secondary | ICD-10-CM | POA: Diagnosis not present

## 2023-07-22 DIAGNOSIS — I480 Paroxysmal atrial fibrillation: Secondary | ICD-10-CM | POA: Diagnosis not present

## 2023-07-22 DIAGNOSIS — I110 Hypertensive heart disease with heart failure: Secondary | ICD-10-CM | POA: Diagnosis not present

## 2023-07-22 DIAGNOSIS — E876 Hypokalemia: Secondary | ICD-10-CM | POA: Diagnosis not present

## 2023-07-22 DIAGNOSIS — M0609 Rheumatoid arthritis without rheumatoid factor, multiple sites: Secondary | ICD-10-CM | POA: Diagnosis not present

## 2023-07-22 DIAGNOSIS — F411 Generalized anxiety disorder: Secondary | ICD-10-CM | POA: Diagnosis not present

## 2023-07-24 ENCOUNTER — Encounter: Payer: Self-pay | Admitting: Adult Health

## 2023-07-24 ENCOUNTER — Telehealth: Payer: Self-pay | Admitting: Adult Health

## 2023-07-24 ENCOUNTER — Ambulatory Visit (INDEPENDENT_AMBULATORY_CARE_PROVIDER_SITE_OTHER): Payer: Medicare Other | Admitting: Adult Health

## 2023-07-24 VITALS — BP 140/80 | HR 90 | Temp 98.2°F | Ht 62.5 in | Wt 153.0 lb

## 2023-07-24 DIAGNOSIS — I10 Essential (primary) hypertension: Secondary | ICD-10-CM

## 2023-07-24 DIAGNOSIS — R531 Weakness: Secondary | ICD-10-CM | POA: Diagnosis not present

## 2023-07-24 DIAGNOSIS — Z8679 Personal history of other diseases of the circulatory system: Secondary | ICD-10-CM

## 2023-07-24 DIAGNOSIS — I5032 Chronic diastolic (congestive) heart failure: Secondary | ICD-10-CM

## 2023-07-24 DIAGNOSIS — K529 Noninfective gastroenteritis and colitis, unspecified: Secondary | ICD-10-CM | POA: Diagnosis not present

## 2023-07-24 DIAGNOSIS — E876 Hypokalemia: Secondary | ICD-10-CM

## 2023-07-24 DIAGNOSIS — N179 Acute kidney failure, unspecified: Secondary | ICD-10-CM

## 2023-07-24 DIAGNOSIS — A419 Sepsis, unspecified organism: Secondary | ICD-10-CM

## 2023-07-24 DIAGNOSIS — Z8619 Personal history of other infectious and parasitic diseases: Secondary | ICD-10-CM

## 2023-07-24 DIAGNOSIS — K921 Melena: Secondary | ICD-10-CM

## 2023-07-24 DIAGNOSIS — J449 Chronic obstructive pulmonary disease, unspecified: Secondary | ICD-10-CM

## 2023-07-24 DIAGNOSIS — I447 Left bundle-branch block, unspecified: Secondary | ICD-10-CM

## 2023-07-24 DIAGNOSIS — I480 Paroxysmal atrial fibrillation: Secondary | ICD-10-CM

## 2023-07-24 LAB — BASIC METABOLIC PANEL
BUN: 34 mg/dL — ABNORMAL HIGH (ref 6–23)
CO2: 31 mEq/L (ref 19–32)
Calcium: 9.8 mg/dL (ref 8.4–10.5)
Chloride: 100 mEq/L (ref 96–112)
Creatinine, Ser: 1.36 mg/dL — ABNORMAL HIGH (ref 0.40–1.20)
GFR: 35.51 mL/min — ABNORMAL LOW (ref >60.00)
Glucose, Bld: 89 mg/dL (ref 70–99)
Potassium: 5.2 mEq/L — ABNORMAL HIGH (ref 3.5–5.1)
Sodium: 138 mEq/L (ref 135–145)

## 2023-07-24 LAB — CBC
HCT: 37.7 % (ref 36.0–46.0)
Hemoglobin: 12.2 g/dL (ref 12.0–15.0)
MCHC: 32.5 g/dL (ref 30.0–36.0)
MCV: 98.7 fl (ref 78.0–100.0)
Platelets: 300 10*3/uL (ref 150.0–400.0)
RBC: 3.82 Mil/uL — ABNORMAL LOW (ref 3.87–5.11)
RDW: 13.4 % (ref 11.5–15.5)
WBC: 7.9 10*3/uL (ref 4.0–10.5)

## 2023-07-24 NOTE — Progress Notes (Signed)
Subjective:    Patient ID: Julie Rice, female    DOB: [birth year], 87 y.o.   MRN: [id number]  HPI  She presents to the office today for TCM visit  Admit Date 06/01/2023 Discharge from hospital 06/06/2023 Discharge from [facility] 07/12/2023  She presented to the hospital with lower abdominal pain, weakness, possible syncope where she was apparently stuck in her toilet for a couple of hours.  She was found to be hypotensive.  In the ER her CT showed transverse colitis and she was admitted to the hospitalist service.  Hospital Course  Severe Sepsis due to infectious colitis  -She was treated with antibiotics and supportive care.  Her abdominal pain essentially resolved.  She had no diarrhea since the day of admission.  Repeat CT on 8 1 showed no evidence of mesenteric ischemia/colitis/inflammation.  She was on Rocephin/Flagyl and transition to Augmentin which was continued for additional 2 days after discharge  Presyncope/weakness -Secondary  to hypotension.  Overall this improved during hospital admission.  She worked with PT/OT which recommended skilled nursing facility  Hematochezia/mild acute blood loss anemia  -He had no further hematochezia for several days.  Hemoglobin stayed stable.  GI did not plan on endoscopy evaluation and Eliquis was resumed resumed.  No overt bleeding noted hemoglobin again remained stable after Eliquis was resumed.  She was advised to follow-up with GI in the outpatient setting for repeat and endoscopy  AKI -In the setting of hypotension/sepsis.  Creatinine improved throughout hospital admission  Hypokalemia -Due to Lasix -He was supplemented and recheck potassium was within normal limits  COPD with chronic hypoxic respiratory failure on home O2 -Stable was continued on bronchodilators  Chronic HFpEF -Stated overall, she did have minimal left lower extremity edema which improved after restarting Lasix.  She was resumed on Aldactone on  discharge  PAF Resumed amiodarone/metoprolol.  Maintained sinus rhythm during admission  History of CAD/new LBBB -Stable.  She was continued on metoprolol statin  Hypertension  -He was continued on losartan.  Dosage of metoprolol increased on day of discharge and Aldactone was added.  Today she reports that she is doing well overall as though she has improved since being discharged from skilled nursing facility.  She did have at home speech therapy coming out but she graduated from this.  She continues with home health PT and OT.  She is using a walker or cane for ambulation to her more ability.  She has not had any fevers or chills since being discharged from the hospital.  She does have an occasional abdominal discomfort but has not had any diarrhea or blood in her stool.  Does have an a follow-up with gastroenterology in a few weeks.  She reports that her oxygen concentrator stopped working yesterday.  She has been monitoring her pulse ox and it has been in the mid to high 90s but she does wear oxygen at night.   Review of Systems  Constitutional: Negative.   HENT: Negative.    Eyes: Negative.   Respiratory:  Positive for shortness of breath.   Cardiovascular: Negative.   Gastrointestinal: Negative.   Endocrine: Negative.   Genitourinary: Negative.   Musculoskeletal:  Positive for arthralgias, back pain and gait problem.  Skin: Negative.   Allergic/Immunologic: Negative.   Hematological: Negative.   Psychiatric/Behavioral: Negative.     Past Medical History:  Diagnosis Date   A-fib (HCC)    Anemia    Anxiety    Bipolar disorder (HCC)  Blood transfusion 1991   autologous pts own blood given    CAD (coronary artery disease)    Cataract    Chest pain    "@ rest, lying down, w/exertion"   CHF (congestive heart failure) (HCC) 07/2020   Chronic back pain    "mostly lower back but I do have upper back pain regularly" (04/06/2018)   COPD (chronic obstructive pulmonary  disease) (HCC)    Depression    Esophageal stricture    Fibromyalgia    GERD (gastroesophageal reflux disease)    Heart murmur    "slight" (04/06/2018)   Hiatal hernia    Hyperlipidemia    Patient denies   Hypertension    Internal hemorrhoids    Lumbago    Mitral regurgitation    Osteoarthritis    Osteoporosis    Pneumonia ~ 01/2018   PONV (postoperative nausea and vomiting)    severe ponv, "in the past" (04/06/2018)   Rectal bleeding    Rheumatoid arthritis (HCC)    Spondylosis    TIA (transient ischemic attack) 2013   Urinary incontinence    wears depends    Social History   Socioeconomic History   Marital status: Widowed    Spouse name: Not on file   Number of children: 2   Years of education: 37   Highest education level: 12th grade  Occupational History   Occupation: retired    Associate Professor: RETIRED  Tobacco Use   Smoking status: Never    Passive exposure: Past   Smokeless tobacco: Never  Vaping Use   Vaping status: Never Used  Substance and Sexual Activity   Alcohol use: Never   Drug use: Never   Sexual activity: Not Currently    Comment: 1st intercourse 57 yo-1 partner  Other Topics Concern   Not on file  Social History Narrative   Retired    Widowed   Right handed   Comes with sister today   Social Determinants of Health   Financial Resource Strain: Low Risk  (02/14/2022)   Overall Financial Resource Strain (CARDIA)    Difficulty of Paying Living Expenses: Not hard at all  Food Insecurity: No Food Insecurity (12/08/2022)   Hunger Vital Sign    Worried About Running Out of Food in the Last Year: Never true    Ran Out of Food in the Last Year: Never true  Transportation Needs: No Transportation Needs (12/08/2022)   PRAPARE - Administrator, Civil Service (Medical): No    Lack of Transportation (Non-Medical): No  Physical Activity: Inactive (02/14/2022)   Exercise Vital Sign    Days of Exercise per Week: 0 days    Minutes of Exercise per  Session: 0 min  Stress: No Stress Concern Present (02/14/2022)   [employer] of Occupational Health - Occupational Stress Questionnaire    Feeling of Stress : Not at all  Social Connections: Moderately Integrated (02/14/2022)   Social Connection and Isolation Panel [NHANES]    Frequency of Communication with Friends and Family: More than three times a week    Frequency of Social Gatherings with Friends and Family: More than three times a week    Attends Religious Services: More than 4 times per year    Active Member of [employer] or Organizations: Yes    Attends Banker Meetings: More than 4 times per year    Marital Status: Widowed  Intimate Partner Violence: Not At Risk (12/08/2022)   Humiliation, Afraid, Rape, and Kick questionnaire  Fear of Current or Ex-Partner: No    Emotionally Abused: No    Physically Abused: No    Sexually Abused: No    Past Surgical History:  Procedure Laterality Date   ABDOMINAL HYSTERECTOMY  1982   BACK SURGERY     BALLOON DILATION N/A 09/21/2018   Procedure: BALLOON DILATION;  Surgeon: Hilarie Fredrickson, MD;  Location: [facility] ENDOSCOPY;  Service: Endoscopy;  Laterality: N/A;   BALLOON DILATION N/A 08/12/2019   Procedure: BALLOON DILATION;  Surgeon: Lynann Bologna, MD;  Location: [facility] ENDOSCOPY;  Service: Endoscopy;  Laterality: N/A;   BIOPSY  08/12/2019   Procedure: BIOPSY;  Surgeon: Lynann Bologna, MD;  Location: [facility] ENDOSCOPY;  Service: Endoscopy;;   BLADDER SUSPENSION  1980's   BOTOX INJECTION N/A 09/21/2018   Procedure: BOTOX INJECTION;  Surgeon: Hilarie Fredrickson, MD;  Location: [facility] ENDOSCOPY;  Service: Endoscopy;  Laterality: N/A;   BOTOX INJECTION N/A 08/12/2019   Procedure: BOTOX INJECTION;  Surgeon: Lynann Bologna, MD;  Location: [facility] ENDOSCOPY;  Service: Endoscopy;  Laterality: N/A;   BOTOX INJECTION N/A 09/27/2022   Procedure: BOTOX INJECTION;  Surgeon: Sherrilyn Rist, MD;  Location: [facility] ENDOSCOPY;  Service: Gastroenterology;  Laterality: N/A;    BOTOX INJECTION N/A 02/24/2023   Procedure: BOTOX INJECTION;  Surgeon: Hilarie Fredrickson, MD;  Location: [facility] ENDOSCOPY;  Service: Gastroenterology;  Laterality: N/A;   CATARACT EXTRACTION W/ INTRAOCULAR LENS  IMPLANT, BILATERAL Bilateral 2010   DILATION AND CURETTAGE OF UTERUS  1961   ESOPHAGEAL MANOMETRY N/A 03/25/2018   Procedure: ESOPHAGEAL MANOMETRY (EM);  Surgeon: Napoleon Form, MD;  Location: [facility] ENDOSCOPY;  Service: Endoscopy;  Laterality: N/A;   ESOPHAGOGASTRODUODENOSCOPY (EGD) WITH PROPOFOL N/A 04/07/2018   Procedure: ESOPHAGOGASTRODUODENOSCOPY (EGD) WITH PROPOFOL;  Surgeon: Sherrilyn Rist, MD;  Location: [facility] ENDOSCOPY;  Service: Gastroenterology;  Laterality: N/A;   ESOPHAGOGASTRODUODENOSCOPY (EGD) WITH PROPOFOL N/A 09/21/2018   Procedure: ESOPHAGOGASTRODUODENOSCOPY (EGD) WITH PROPOFOL;  Surgeon: Hilarie Fredrickson, MD;  Location: [facility] ENDOSCOPY;  Service: Endoscopy;  Laterality: N/A;   ESOPHAGOGASTRODUODENOSCOPY (EGD) WITH PROPOFOL N/A 08/12/2019   Procedure: ESOPHAGOGASTRODUODENOSCOPY (EGD) WITH PROPOFOL;  Surgeon: Lynann Bologna, MD;  Location: [facility] ENDOSCOPY;  Service: Endoscopy;  Laterality: N/A;   ESOPHAGOGASTRODUODENOSCOPY (EGD) WITH PROPOFOL N/A 09/27/2022   Procedure: ESOPHAGOGASTRODUODENOSCOPY (EGD) WITH PROPOFOL;  Surgeon: Sherrilyn Rist, MD;  Location: [facility] ENDOSCOPY;  Service: Gastroenterology;  Laterality: N/A;   ESOPHAGOGASTRODUODENOSCOPY (EGD) WITH PROPOFOL N/A 02/24/2023   Procedure: ESOPHAGOGASTRODUODENOSCOPY (EGD) WITH PROPOFOL;  Surgeon: Hilarie Fredrickson, MD;  Location: [facility] ENDOSCOPY;  Service: Gastroenterology;  Laterality: N/A;   HERNIA REPAIR     HIATAL HERNIA REPAIR N/A 08/02/2016   Procedure: LAPAROSCOPIC REPAIR OF LARGE  HIATAL HERNIA;  Surgeon: Glenna Fellows, MD;  Location: [facility] ORS;  Service: General;  Laterality: N/A;   JOINT REPLACEMENT     LAPAROSCOPIC NISSEN FUNDOPLICATION N/A 08/02/2016   Procedure: LAPAROSCOPIC NISSEN FUNDOPLICATION;  Surgeon: Glenna Fellows, MD;   Location: [facility] ORS;  Service: General;  Laterality: N/A;   LUMBAR LAMINECTOMY  1990; 1994; [id number]   "I've got 2 stainless steel rods; 6 screws; 2 ray cages"took bone from right hip to put in back   RIGHT/LEFT HEART CATH AND CORONARY ANGIOGRAPHY N/A 07/26/2019   Procedure: RIGHT/LEFT HEART CATH AND CORONARY ANGIOGRAPHY;  Surgeon: Lyn Records, MD;  Location: [facility] INVASIVE CV LAB;  Service: Cardiovascular;  Laterality: N/A;   SVT ABLATION N/A 04/25/2021   Procedure: SVT ABLATION;  Surgeon: Regan Lemming, MD;  Location: [facility] INVASIVE  CV LAB;  Service: Cardiovascular;  Laterality: N/A;   TEE WITHOUT CARDIOVERSION N/A 08/19/2019   Procedure: TRANSESOPHAGEAL ECHOCARDIOGRAM (TEE);  Surgeon: Lewayne Bunting, MD;  Location: [facility] ENDOSCOPY;  Service: Cardiovascular;  Laterality: N/A;   TONSILLECTOMY AND ADENOIDECTOMY  1945   TOTAL KNEE ARTHROPLASTY Right ~ 1996   TUBAL LIGATION  ~ 1976    Family History  Problem Relation Age of Onset   Kidney disease Mother    Hypertension Mother    Heart disease Father 73       die of MI at age 67   Heart disease Brother    Heart attack Brother    Suicidality Son    Other Son        MVA   Colon cancer Neg Hx    Esophageal cancer Neg Hx    Pancreatic cancer Neg Hx    Rectal cancer Neg Hx    Stomach cancer Neg Hx     Allergies  Allergen Reactions   Tape Other (See Comments)    Band aides, adhesive tape; Redness and pulls skin off   Tramadol Other (See Comments)    Pt has prolonged Qtc interval of 540- cannot give tramadol per pharmacy   Morphine Itching, Rash and Other (See Comments)    Flushing    Current Outpatient Medications on File Prior to Visit  Medication Sig Dispense Refill   acetaminophen (TYLENOL) 500 MG tablet Take 1,000 mg by mouth every 6 (six) hours as needed for moderate pain, headache or fever.     albuterol (VENTOLIN HFA) 108 (90 Base) MCG/ACT inhaler INHALE TWO PUFFS BY MOUTH EVERY 6 HOURS AS NEEDED FOR WHEEZING OR SHORTNESS  OF BREATH 8.5 g 1   amiodarone (PACERONE) 200 MG tablet NEW PRESCRIPTION REQUEST: TAKE ONE TABLET BY MOUTH DAILY (Patient taking differently: Take 200 mg by mouth daily.) 90 tablet 3   buPROPion (WELLBUTRIN XL) 300 MG 24 hr tablet Take 1 tablet (300 mg total) by mouth daily. (Patient taking differently: Take 300 mg by mouth daily as needed (mood).) 90 tablet 1   Carboxymethylcellulose Sodium (REFRESH TEARS OP) Place 1 drop into both eyes 2 (two) times daily.     cetirizine (ZYRTEC) 10 MG tablet TAKE 1 TABLET BY MOUTH EVERY DAY AS NEEDED FOR ALLERGY (Patient taking differently: Take 10 mg by mouth daily as needed for allergies.) 90 tablet 0   clonazePAM (KLONOPIN) 0.5 MG tablet Take 1 tablet (0.5 mg total) by mouth 2 (two) times daily. 15 tablet 0   DULoxetine (CYMBALTA) 60 MG capsule Take 1 capsule (60 mg total) by mouth daily. 90 capsule 1   ELIQUIS 5 MG TABS tablet TAKE 1 TABLET BY MOUTH TWICE A DAY (Patient taking differently: Take 5 mg by mouth 2 (two) times daily.) 180 tablet 1   FeFum-FePoly-FA-B Cmp-C-Biot (FOLIVANE-PLUS) CAPS TAKE ONE CAPSULE BY MOUTH IN THE EARLY MORNING 90 capsule 0   furosemide (LASIX) 40 MG tablet Take one tablet (40 mg) by mouth every other day alternating with two tablets (80 mg) on the opposite days. (Patient taking differently: Take 40-80 mg by mouth See admin instructions. Take one tablet (40 mg) by mouth every other day alternating with two tablets (80 mg) on the opposite days.) 90 tablet 3   KLOR-CON M20 20 MEQ tablet TAKE ONE TABLET BY MOUTH DAILY 30 tablet 8   losartan (COZAAR) 50 MG tablet TAKE 1 TABLET BY MOUTH EVERY DAY 90 tablet 1   metoprolol tartrate (LOPRESSOR) 50 MG tablet  Take 1 tablet (50 mg total) by mouth daily. 90 tablet 3   Multiple Vitamins-Minerals (ONE-A-DAY WOMENS 50+) TABS Take 1 tablet by mouth in the morning.     omeprazole (PRILOSEC) 20 MG capsule TAKE 1 CAPSULE BY MOUTH EVERY DAY 90 capsule 3   pantoprazole (PROTONIX) 40 MG tablet Take 1  tablet (40 mg total) by mouth 2 (two) times daily. 60 tablet 11   polyethylene glycol (MIRALAX / GLYCOLAX) 17 g packet Take 17 g by mouth daily.     rosuvastatin (CRESTOR) 20 MG tablet Take 1 tablet (20 mg total) by mouth daily at 6 PM. 90 tablet 1   sodium chloride (OCEAN) 0.65 % SOLN nasal spray Place 1 spray into both nostrils at bedtime.     spironolactone (ALDACTONE) 25 MG tablet NEW PRESCRIPTION REQUEST: TAKE ONE TABLET BY MOUTH DAILY (Patient taking differently: Take 25 mg by mouth daily.) 90 tablet 3   No current facility-administered medications on file prior to visit.    BP (!) 140/80   Pulse 90   Temp 98.2 F (36.8 C) (Oral)   Ht 5' 2.5" (1.588 m)   Wt 153 lb (69.4 kg)   SpO2 94%   BMI 27.54 kg/m       Objective:   Physical Exam Vitals and nursing note reviewed.  Constitutional:      Appearance: Normal appearance.  Cardiovascular:     Rate and Rhythm: Normal rate and regular rhythm.     Pulses: Normal pulses.     Heart sounds: Normal heart sounds.  Pulmonary:     Effort: Pulmonary effort is normal.     Breath sounds: Normal breath sounds.  Abdominal:     General: Abdomen is flat.     Palpations: Abdomen is soft.  Musculoskeletal:        General: Normal range of motion.  Skin:    General: Skin is warm and dry.     Capillary Refill: Capillary refill takes less than 2 seconds.  Neurological:     General: No focal deficit present.     Mental Status: She is alert.     Gait: Gait abnormal (slow steady gait with walker).  Psychiatric:        Mood and Affect: Mood normal.        Behavior: Behavior normal.        Thought Content: Thought content normal.        Judgment: Judgment normal.       Assessment & Plan:  1. Sepsis, due to unspecified organism, unspecified whether acute organ dysfunction present [facility]) -Reviewed hospital notes, discharge instructions, labs, imaging, and medication changes.  All questions answered to the best of my ability - CBC;  Future - Basic Metabolic Panel; Future - Basic Metabolic Panel - CBC  2. Colitis -Resolved but continues to have some mild abdominal pain.  -Follow-up with GI as directed -Follow-up if diarrhea or bloody stool presents - CBC; Future - Basic Metabolic Panel; Future - Basic Metabolic Panel - CBC  3. Generalized weakness -Continue to work with home health PT OT - CBC; Future - Basic Metabolic Panel; Future - Basic Metabolic Panel - CBC  4. Hematochezia -Resolved - CBC; Future - Basic Metabolic Panel; Future - Basic Metabolic Panel - CBC  5. AKI (acute kidney injury) (HCC) -Resolved prior to discharge - CBC; Future - Basic Metabolic Panel; Future - Basic Metabolic Panel - CBC  6. Hypokalemia -Resolved prior to discharge - CBC; Future - Basic Metabolic Panel;  Future - Basic Metabolic Panel - CBC  7. Chronic obstructive pulmonary disease, unspecified COPD type (HCC) -Will call [facility] today to see if we can get her oxygen concentrator fixed ASAP - continue with Bronchodilators  - CBC; Future - Basic Metabolic Panel; Future - Basic Metabolic Panel - CBC  8. Chronic heart failure with preserved ejection fraction (HFpEF) (HCC) - continue with Aldactone - CBC; Future - Basic Metabolic Panel; Future - Basic Metabolic Panel - CBC  9. PAF (paroxysmal atrial fibrillation) (HCC) - Continue with Metoprolol and amiodarone  - CBC; Future - Basic Metabolic Panel; Future - Basic Metabolic Panel - CBC  10. History of CAD (coronary artery disease) - Continue with statin  - CBC; Future - Basic Metabolic Panel; Future - Basic Metabolic Panel - CBC  11. LBBB (left bundle branch block) - Continue with metoprolol  - CBC; Future - Basic Metabolic Panel; Future - Basic Metabolic Panel - CBC  12. Essential hypertension - controlled. No change in medication  - CBC; Future - Basic Metabolic Panel; Future - Basic Metabolic Panel - CBC  Shirline Frees, NP

## 2023-07-24 NOTE — Patient Instructions (Signed)
.  It was great seeing you today   We will follow up with you regarding your lab work   Please let me know if you need anything

## 2023-07-24 NOTE — Telephone Encounter (Signed)
[facility] called to F/U on the order that has, according to [facility], been faxed several times in the last month.  Fax was located in NP's eFax folder.   [facility] states this order needs to be signed & returned, as soon as possible.

## 2023-07-25 DIAGNOSIS — K529 Noninfective gastroenteritis and colitis, unspecified: Secondary | ICD-10-CM | POA: Diagnosis not present

## 2023-07-25 DIAGNOSIS — I5032 Chronic diastolic (congestive) heart failure: Secondary | ICD-10-CM | POA: Diagnosis not present

## 2023-07-25 DIAGNOSIS — M0609 Rheumatoid arthritis without rheumatoid factor, multiple sites: Secondary | ICD-10-CM | POA: Diagnosis not present

## 2023-07-25 DIAGNOSIS — I447 Left bundle-branch block, unspecified: Secondary | ICD-10-CM | POA: Diagnosis not present

## 2023-07-25 DIAGNOSIS — J449 Chronic obstructive pulmonary disease, unspecified: Secondary | ICD-10-CM | POA: Diagnosis not present

## 2023-07-25 DIAGNOSIS — J9611 Chronic respiratory failure with hypoxia: Secondary | ICD-10-CM | POA: Diagnosis not present

## 2023-07-25 DIAGNOSIS — I2510 Atherosclerotic heart disease of native coronary artery without angina pectoris: Secondary | ICD-10-CM | POA: Diagnosis not present

## 2023-07-25 DIAGNOSIS — D62 Acute posthemorrhagic anemia: Secondary | ICD-10-CM | POA: Diagnosis not present

## 2023-07-25 DIAGNOSIS — I110 Hypertensive heart disease with heart failure: Secondary | ICD-10-CM | POA: Diagnosis not present

## 2023-07-25 DIAGNOSIS — N179 Acute kidney failure, unspecified: Secondary | ICD-10-CM | POA: Diagnosis not present

## 2023-07-25 DIAGNOSIS — F411 Generalized anxiety disorder: Secondary | ICD-10-CM | POA: Diagnosis not present

## 2023-07-25 DIAGNOSIS — E876 Hypokalemia: Secondary | ICD-10-CM | POA: Diagnosis not present

## 2023-07-25 DIAGNOSIS — K222 Esophageal obstruction: Secondary | ICD-10-CM | POA: Diagnosis not present

## 2023-07-25 DIAGNOSIS — K5791 Diverticulosis of intestine, part unspecified, without perforation or abscess with bleeding: Secondary | ICD-10-CM | POA: Diagnosis not present

## 2023-07-25 DIAGNOSIS — K449 Diaphragmatic hernia without obstruction or gangrene: Secondary | ICD-10-CM | POA: Diagnosis not present

## 2023-07-25 DIAGNOSIS — I480 Paroxysmal atrial fibrillation: Secondary | ICD-10-CM | POA: Diagnosis not present

## 2023-07-25 NOTE — Telephone Encounter (Signed)
Form has been faxed.

## 2023-07-28 DIAGNOSIS — E876 Hypokalemia: Secondary | ICD-10-CM | POA: Diagnosis not present

## 2023-07-28 DIAGNOSIS — N179 Acute kidney failure, unspecified: Secondary | ICD-10-CM | POA: Diagnosis not present

## 2023-07-28 DIAGNOSIS — D62 Acute posthemorrhagic anemia: Secondary | ICD-10-CM | POA: Diagnosis not present

## 2023-07-28 DIAGNOSIS — J9611 Chronic respiratory failure with hypoxia: Secondary | ICD-10-CM | POA: Diagnosis not present

## 2023-07-28 DIAGNOSIS — K449 Diaphragmatic hernia without obstruction or gangrene: Secondary | ICD-10-CM | POA: Diagnosis not present

## 2023-07-28 DIAGNOSIS — I5032 Chronic diastolic (congestive) heart failure: Secondary | ICD-10-CM | POA: Diagnosis not present

## 2023-07-28 DIAGNOSIS — J449 Chronic obstructive pulmonary disease, unspecified: Secondary | ICD-10-CM | POA: Diagnosis not present

## 2023-07-28 DIAGNOSIS — K5791 Diverticulosis of intestine, part unspecified, without perforation or abscess with bleeding: Secondary | ICD-10-CM | POA: Diagnosis not present

## 2023-07-28 DIAGNOSIS — I447 Left bundle-branch block, unspecified: Secondary | ICD-10-CM | POA: Diagnosis not present

## 2023-07-28 DIAGNOSIS — K222 Esophageal obstruction: Secondary | ICD-10-CM | POA: Diagnosis not present

## 2023-07-28 DIAGNOSIS — M0609 Rheumatoid arthritis without rheumatoid factor, multiple sites: Secondary | ICD-10-CM | POA: Diagnosis not present

## 2023-07-28 DIAGNOSIS — K529 Noninfective gastroenteritis and colitis, unspecified: Secondary | ICD-10-CM | POA: Diagnosis not present

## 2023-07-28 DIAGNOSIS — I480 Paroxysmal atrial fibrillation: Secondary | ICD-10-CM | POA: Diagnosis not present

## 2023-07-28 DIAGNOSIS — I2510 Atherosclerotic heart disease of native coronary artery without angina pectoris: Secondary | ICD-10-CM | POA: Diagnosis not present

## 2023-07-28 DIAGNOSIS — F411 Generalized anxiety disorder: Secondary | ICD-10-CM | POA: Diagnosis not present

## 2023-07-28 DIAGNOSIS — I110 Hypertensive heart disease with heart failure: Secondary | ICD-10-CM | POA: Diagnosis not present

## 2023-07-30 ENCOUNTER — Other Ambulatory Visit: Payer: Medicare Other | Admitting: Rheumatology

## 2023-07-31 ENCOUNTER — Telehealth: Payer: Self-pay | Admitting: Adult Health

## 2023-07-31 DIAGNOSIS — K449 Diaphragmatic hernia without obstruction or gangrene: Secondary | ICD-10-CM | POA: Diagnosis not present

## 2023-07-31 DIAGNOSIS — K529 Noninfective gastroenteritis and colitis, unspecified: Secondary | ICD-10-CM | POA: Diagnosis not present

## 2023-07-31 DIAGNOSIS — J449 Chronic obstructive pulmonary disease, unspecified: Secondary | ICD-10-CM | POA: Diagnosis not present

## 2023-07-31 DIAGNOSIS — E876 Hypokalemia: Secondary | ICD-10-CM | POA: Diagnosis not present

## 2023-07-31 DIAGNOSIS — K5791 Diverticulosis of intestine, part unspecified, without perforation or abscess with bleeding: Secondary | ICD-10-CM | POA: Diagnosis not present

## 2023-07-31 DIAGNOSIS — I447 Left bundle-branch block, unspecified: Secondary | ICD-10-CM | POA: Diagnosis not present

## 2023-07-31 DIAGNOSIS — M0609 Rheumatoid arthritis without rheumatoid factor, multiple sites: Secondary | ICD-10-CM | POA: Diagnosis not present

## 2023-07-31 DIAGNOSIS — N179 Acute kidney failure, unspecified: Secondary | ICD-10-CM | POA: Diagnosis not present

## 2023-07-31 DIAGNOSIS — I5032 Chronic diastolic (congestive) heart failure: Secondary | ICD-10-CM | POA: Diagnosis not present

## 2023-07-31 DIAGNOSIS — D62 Acute posthemorrhagic anemia: Secondary | ICD-10-CM | POA: Diagnosis not present

## 2023-07-31 DIAGNOSIS — J9611 Chronic respiratory failure with hypoxia: Secondary | ICD-10-CM | POA: Diagnosis not present

## 2023-07-31 DIAGNOSIS — I2510 Atherosclerotic heart disease of native coronary artery without angina pectoris: Secondary | ICD-10-CM | POA: Diagnosis not present

## 2023-07-31 DIAGNOSIS — I480 Paroxysmal atrial fibrillation: Secondary | ICD-10-CM | POA: Diagnosis not present

## 2023-07-31 DIAGNOSIS — F411 Generalized anxiety disorder: Secondary | ICD-10-CM | POA: Diagnosis not present

## 2023-07-31 DIAGNOSIS — I110 Hypertensive heart disease with heart failure: Secondary | ICD-10-CM | POA: Diagnosis not present

## 2023-07-31 DIAGNOSIS — K222 Esophageal obstruction: Secondary | ICD-10-CM | POA: Diagnosis not present

## 2023-07-31 NOTE — Telephone Encounter (Signed)
Julie Rice with [facility] is calling and she faxed order yesterday they are over 30 days and checking on the status

## 2023-08-01 NOTE — Telephone Encounter (Signed)
Tried [employer] multiple times. No sire on what order she is referring to.

## 2023-08-05 NOTE — Telephone Encounter (Signed)
Left message to return phone call.

## 2023-08-05 NOTE — Telephone Encounter (Signed)
Julie Rice is returning [place] call

## 2023-08-06 DIAGNOSIS — I447 Left bundle-branch block, unspecified: Secondary | ICD-10-CM | POA: Diagnosis not present

## 2023-08-06 DIAGNOSIS — K449 Diaphragmatic hernia without obstruction or gangrene: Secondary | ICD-10-CM | POA: Diagnosis not present

## 2023-08-06 DIAGNOSIS — M0609 Rheumatoid arthritis without rheumatoid factor, multiple sites: Secondary | ICD-10-CM | POA: Diagnosis not present

## 2023-08-06 DIAGNOSIS — K5791 Diverticulosis of intestine, part unspecified, without perforation or abscess with bleeding: Secondary | ICD-10-CM | POA: Diagnosis not present

## 2023-08-06 DIAGNOSIS — J9611 Chronic respiratory failure with hypoxia: Secondary | ICD-10-CM | POA: Diagnosis not present

## 2023-08-06 DIAGNOSIS — E876 Hypokalemia: Secondary | ICD-10-CM | POA: Diagnosis not present

## 2023-08-06 DIAGNOSIS — K529 Noninfective gastroenteritis and colitis, unspecified: Secondary | ICD-10-CM | POA: Diagnosis not present

## 2023-08-06 DIAGNOSIS — F411 Generalized anxiety disorder: Secondary | ICD-10-CM | POA: Diagnosis not present

## 2023-08-06 DIAGNOSIS — K222 Esophageal obstruction: Secondary | ICD-10-CM | POA: Diagnosis not present

## 2023-08-06 DIAGNOSIS — D62 Acute posthemorrhagic anemia: Secondary | ICD-10-CM | POA: Diagnosis not present

## 2023-08-06 DIAGNOSIS — I2510 Atherosclerotic heart disease of native coronary artery without angina pectoris: Secondary | ICD-10-CM | POA: Diagnosis not present

## 2023-08-06 DIAGNOSIS — N179 Acute kidney failure, unspecified: Secondary | ICD-10-CM | POA: Diagnosis not present

## 2023-08-06 DIAGNOSIS — I110 Hypertensive heart disease with heart failure: Secondary | ICD-10-CM | POA: Diagnosis not present

## 2023-08-06 DIAGNOSIS — J449 Chronic obstructive pulmonary disease, unspecified: Secondary | ICD-10-CM | POA: Diagnosis not present

## 2023-08-06 DIAGNOSIS — I480 Paroxysmal atrial fibrillation: Secondary | ICD-10-CM | POA: Diagnosis not present

## 2023-08-06 DIAGNOSIS — I5032 Chronic diastolic (congestive) heart failure: Secondary | ICD-10-CM | POA: Diagnosis not present

## 2023-08-06 NOTE — Telephone Encounter (Signed)
Spoke to Mart and found the PPW she was looking for. Will refax. Also Isabelle Course will send over another form to be filled out and faxed. Will route to PCP for FYI.

## 2023-08-06 NOTE — Telephone Encounter (Signed)
Noted! PPW faxed awaiting confirmation.

## 2023-08-11 DIAGNOSIS — I447 Left bundle-branch block, unspecified: Secondary | ICD-10-CM | POA: Diagnosis not present

## 2023-08-11 DIAGNOSIS — N179 Acute kidney failure, unspecified: Secondary | ICD-10-CM | POA: Diagnosis not present

## 2023-08-11 DIAGNOSIS — J9611 Chronic respiratory failure with hypoxia: Secondary | ICD-10-CM | POA: Diagnosis not present

## 2023-08-11 DIAGNOSIS — I110 Hypertensive heart disease with heart failure: Secondary | ICD-10-CM | POA: Diagnosis not present

## 2023-08-11 DIAGNOSIS — D62 Acute posthemorrhagic anemia: Secondary | ICD-10-CM | POA: Diagnosis not present

## 2023-08-11 DIAGNOSIS — K529 Noninfective gastroenteritis and colitis, unspecified: Secondary | ICD-10-CM | POA: Diagnosis not present

## 2023-08-11 DIAGNOSIS — K5791 Diverticulosis of intestine, part unspecified, without perforation or abscess with bleeding: Secondary | ICD-10-CM | POA: Diagnosis not present

## 2023-08-11 DIAGNOSIS — E876 Hypokalemia: Secondary | ICD-10-CM | POA: Diagnosis not present

## 2023-08-11 DIAGNOSIS — K449 Diaphragmatic hernia without obstruction or gangrene: Secondary | ICD-10-CM | POA: Diagnosis not present

## 2023-08-11 DIAGNOSIS — I5032 Chronic diastolic (congestive) heart failure: Secondary | ICD-10-CM | POA: Diagnosis not present

## 2023-08-11 DIAGNOSIS — K222 Esophageal obstruction: Secondary | ICD-10-CM | POA: Diagnosis not present

## 2023-08-11 DIAGNOSIS — J449 Chronic obstructive pulmonary disease, unspecified: Secondary | ICD-10-CM | POA: Diagnosis not present

## 2023-08-11 DIAGNOSIS — F411 Generalized anxiety disorder: Secondary | ICD-10-CM | POA: Diagnosis not present

## 2023-08-11 DIAGNOSIS — M0609 Rheumatoid arthritis without rheumatoid factor, multiple sites: Secondary | ICD-10-CM | POA: Diagnosis not present

## 2023-08-11 DIAGNOSIS — I480 Paroxysmal atrial fibrillation: Secondary | ICD-10-CM | POA: Diagnosis not present

## 2023-08-11 DIAGNOSIS — I2510 Atherosclerotic heart disease of native coronary artery without angina pectoris: Secondary | ICD-10-CM | POA: Diagnosis not present

## 2023-08-13 ENCOUNTER — Ambulatory Visit: Payer: Medicare Other | Admitting: Internal Medicine

## 2023-08-14 ENCOUNTER — Telehealth: Payer: Self-pay | Admitting: Internal Medicine

## 2023-08-14 DIAGNOSIS — I447 Left bundle-branch block, unspecified: Secondary | ICD-10-CM | POA: Diagnosis not present

## 2023-08-14 DIAGNOSIS — J9611 Chronic respiratory failure with hypoxia: Secondary | ICD-10-CM | POA: Diagnosis not present

## 2023-08-14 DIAGNOSIS — F411 Generalized anxiety disorder: Secondary | ICD-10-CM | POA: Diagnosis not present

## 2023-08-14 DIAGNOSIS — J449 Chronic obstructive pulmonary disease, unspecified: Secondary | ICD-10-CM | POA: Diagnosis not present

## 2023-08-14 DIAGNOSIS — I2510 Atherosclerotic heart disease of native coronary artery without angina pectoris: Secondary | ICD-10-CM | POA: Diagnosis not present

## 2023-08-14 DIAGNOSIS — K5791 Diverticulosis of intestine, part unspecified, without perforation or abscess with bleeding: Secondary | ICD-10-CM | POA: Diagnosis not present

## 2023-08-14 DIAGNOSIS — D62 Acute posthemorrhagic anemia: Secondary | ICD-10-CM | POA: Diagnosis not present

## 2023-08-14 DIAGNOSIS — I110 Hypertensive heart disease with heart failure: Secondary | ICD-10-CM | POA: Diagnosis not present

## 2023-08-14 DIAGNOSIS — N179 Acute kidney failure, unspecified: Secondary | ICD-10-CM | POA: Diagnosis not present

## 2023-08-14 DIAGNOSIS — K449 Diaphragmatic hernia without obstruction or gangrene: Secondary | ICD-10-CM | POA: Diagnosis not present

## 2023-08-14 DIAGNOSIS — E876 Hypokalemia: Secondary | ICD-10-CM | POA: Diagnosis not present

## 2023-08-14 DIAGNOSIS — I480 Paroxysmal atrial fibrillation: Secondary | ICD-10-CM | POA: Diagnosis not present

## 2023-08-14 DIAGNOSIS — I5032 Chronic diastolic (congestive) heart failure: Secondary | ICD-10-CM | POA: Diagnosis not present

## 2023-08-14 DIAGNOSIS — K529 Noninfective gastroenteritis and colitis, unspecified: Secondary | ICD-10-CM | POA: Diagnosis not present

## 2023-08-14 DIAGNOSIS — K222 Esophageal obstruction: Secondary | ICD-10-CM | POA: Diagnosis not present

## 2023-08-14 DIAGNOSIS — M0609 Rheumatoid arthritis without rheumatoid factor, multiple sites: Secondary | ICD-10-CM | POA: Diagnosis not present

## 2023-08-14 NOTE — Telephone Encounter (Signed)
STAT if HR is under 50 or over 120 (normal HR is 60-100 beats per minute)  What is your heart rate?          HR 111, BP 95/65  Do you have a log of your heart rate readings (document readings)?  Yes  132/61 HR 47 (30 minutes ago)  Do you have any other symptoms?   Pain in left side of head and heat coming to the back of her head.   Caller Cleda Mccreedy) stated patient has been getting a burning sensation in her chest and down her arm.  Caller also noted patient has bilateral edema in both legs.  Caller wants call back directly to patient.

## 2023-08-14 NOTE — Telephone Encounter (Addendum)
I spoke with Lulu, home health nurse, and the pt and the pt is c/o burning in her chest x 7 days that radiates to her left arm that is relived with rest... she also c/o pain in the left side of her head that radiates heat to her neck and back for the past week... she told her PCP Dr Evelene Croon about it but he advised her to tell her Cardiologist.   She denies presyncope, no headache just the sharp pain, no weakness on one side... no slurred words.   She also denies SOB and dizziness.   Pt has h/o TIA so I advised her that if her symptoms persist or worsen to call EMS ASAP so she can be evaluated without delay.   I will make he an appt for the burning in her chest but if that persists or worsens she needs to call EMS.   I made her a DOD appt for tomorrow... I advised he that she should not drive. She will see if her sister can bring her. Pt says she cannot get ride today.   VS with HH today: 132/61 HR 47

## 2023-08-15 ENCOUNTER — Ambulatory Visit: Payer: Medicare Other | Attending: Cardiology | Admitting: Cardiology

## 2023-08-15 ENCOUNTER — Encounter: Payer: Self-pay | Admitting: Cardiology

## 2023-08-15 VITALS — BP 132/62 | HR 51 | Ht 62.5 in | Wt 154.0 lb

## 2023-08-15 DIAGNOSIS — R079 Chest pain, unspecified: Secondary | ICD-10-CM

## 2023-08-15 DIAGNOSIS — I2510 Atherosclerotic heart disease of native coronary artery without angina pectoris: Secondary | ICD-10-CM | POA: Diagnosis not present

## 2023-08-15 DIAGNOSIS — I480 Paroxysmal atrial fibrillation: Secondary | ICD-10-CM

## 2023-08-15 DIAGNOSIS — I2583 Coronary atherosclerosis due to lipid rich plaque: Secondary | ICD-10-CM

## 2023-08-15 DIAGNOSIS — I5020 Unspecified systolic (congestive) heart failure: Secondary | ICD-10-CM | POA: Diagnosis not present

## 2023-08-15 DIAGNOSIS — I471 Supraventricular tachycardia, unspecified: Secondary | ICD-10-CM

## 2023-08-15 DIAGNOSIS — E7800 Pure hypercholesterolemia, unspecified: Secondary | ICD-10-CM

## 2023-08-15 DIAGNOSIS — G459 Transient cerebral ischemic attack, unspecified: Secondary | ICD-10-CM

## 2023-08-15 DIAGNOSIS — Z79899 Other long term (current) drug therapy: Secondary | ICD-10-CM

## 2023-08-15 MED ORDER — EZETIMIBE 10 MG PO TABS: 10.0000 mg | ORAL_TABLET | Freq: Every day | ORAL | 3 refills | Status: AC

## 2023-08-15 NOTE — Addendum Note (Signed)
Addended by: Luellen Pucker on: 08/15/2023 12:15 PM   Modules accepted: Orders

## 2023-08-15 NOTE — Patient Instructions (Signed)
Medication Instructions:  Please START taking Zetia 10 mg daily.   *If you need a refill on your cardiac medications before your next appointment, please call your pharmacy*   Lab Work: Please make an appointment to complete a FASTING lipid panel and an ALT in 8 weeks.   If you have labs (blood work) drawn today and your tests are completely normal, you will receive your results only by: MyChart Message (if you have MyChart) OR A paper copy in the mail If you have any lab test that is abnormal or we need to change your treatment, we will call you to review the results.   Testing/Procedures: How to Prepare for Your Cardiac PET/CT Stress Test:  1. Please do not take these medications before your test:   Medications that may interfere with the cardiac pharmacological stress agent (ex. nitrates - including erectile dysfunction medications, isosorbide mononitrate, tamulosin or beta-blockers) the day of the exam. (Erectile dysfunction medication should be held for at least 72 hrs prior to test) Theophylline containing medications for 12 hours. Dipyridamole 48 hours prior to the test. Your remaining medications may be taken with water.  2. Nothing to eat or drink, except water, 3 hours prior to arrival time.   NO caffeine/decaffeinated products, or chocolate 12 hours prior to arrival.  3. NO perfume, cologne or lotion on chest or abdomen area.          - FEMALES - Please avoid wearing dresses to this appointment.  4. Total time is 1 to 2 hours; you may want to bring reading material for the waiting time.  5. Please report to Radiology at the [facility] Main Entrance 30 minutes early for your test.  [address] [place], [place] [address]     IF YOU THINK YOU MAY BE PREGNANT, OR ARE NURSING PLEASE INFORM THE TECHNOLOGIST.  In preparation for your appointment, medication and supplies will be purchased.  Appointment availability is limited, so if you need to cancel or  reschedule, please call the Radiology Department at [phone] [facility]) OR [phone] [facility])  24 hours in advance to avoid a cancellation fee of $100.00  What to Expect After you Arrive:  Once you arrive and check in for your appointment, you will be taken to a preparation room within the Radiology Department.  A technologist or Nurse will obtain your medical history, verify that you are correctly prepped for the exam, and explain the procedure.  Afterwards,  an IV will be started in your arm and electrodes will be placed on your skin for EKG monitoring during the stress portion of the exam. Then you will be escorted to the PET/CT scanner.  There, staff will get you positioned on the scanner and obtain a blood pressure and EKG.  During the exam, you will continue to be connected to the EKG and blood pressure machines.  A small, safe amount of a radioactive tracer will be injected in your IV to obtain a series of pictures of your heart along with an injection of a stress agent.    After your Exam:  It is recommended that you eat a meal and drink a caffeinated beverage to counter act any effects of the stress agent.  Drink plenty of fluids for the remainder of the day and urinate frequently for the first couple of hours after the exam.  Your doctor will inform you of your test results within 7-10 business days.  For more information and frequently asked questions,  please visit our website : [email or web address]  For questions about your test or how to prepare for your test, please call: Cardiac Imaging Nurse Navigators Office: [phone]    Follow-Up: At [facility], you and your health needs are our priority.  As part of our continuing mission to provide you with exceptional heart care, we have created designated Provider Care Teams.  These Care Teams include your primary Cardiologist (physician) and Advanced Practice Providers (APPs -  Physician Assistants and  Nurse Practitioners) who all work together to provide you with the care you need, when you need it.  We recommend signing up for the patient portal called "MyChart".  Sign up information is provided on this After Visit Summary.  MyChart is used to connect with patients for Virtual Visits (Telemedicine).  Patients are able to view lab/test results, encounter notes, upcoming appointments, etc.  Non-urgent messages can be sent to your provider as well.   To learn more about what you can do with MyChart, go to [email or web address].    Your next appointment:   4 week(s)  Provider:   Dr. Dietrich Pates.

## 2023-08-15 NOTE — Progress Notes (Addendum)
__   Cardiology Office Note   Date:  08/15/2023   ID:  DINA CONDREY, DOB 06/11/1936, MRN [id number]  PCP:  Shirline Frees, NP  Cardiologist:   Dietrich Pates, MD  Patient presents for follow up of afib      History of Present Illness: Julie Rice is a 87 y.o. female with a history of PAF, SVT(s/p ablation in 2022, CAD (nonobstructive cath in 2020 and on CCTA in Nov 2023), HFimp EF, TIA, COPD (home O2), HL, RA, Esohageal stricture, fibromyalgia.   She called in today because she was having chest pain.  She says that she has been having a burning sensation in her chest that mainly come on at night and then radiates into her arms.  She says that these symptoms started last November when she presented to the ER with complaints of chest tightness and a burning sensation across her chest and down both arms which is identical to the symptoms she is complaining about today.  Cardiac enzymes were normal.    She tells me that the burning is not just occurring at night but also is occurring with any exertional activity and is associated with SOB and diaphoresis and also some nausea. A few weeks ago she had an episode and she called EMS and an EKG was done and was taken to [facility].  She also was complaining of abdominal pain and diarrhea and dx with transverse colitis and severe sepsis.     Current Meds  Medication Sig   acetaminophen (TYLENOL) 500 MG tablet Take 1,000 mg by mouth every 6 (six) hours as needed for moderate pain, headache or fever.   albuterol (VENTOLIN HFA) 108 (90 Base) MCG/ACT inhaler INHALE TWO PUFFS BY MOUTH EVERY 6 HOURS AS NEEDED FOR WHEEZING OR SHORTNESS OF BREATH   amiodarone (PACERONE) 200 MG tablet NEW PRESCRIPTION REQUEST: TAKE ONE TABLET BY MOUTH DAILY (Patient taking differently: Take 200 mg by mouth daily.)   buPROPion (WELLBUTRIN XL) 300 MG 24 hr tablet Take 1 tablet (300 mg total) by mouth daily. (Patient taking differently: Take 300 mg by mouth daily as needed (mood).)    Carboxymethylcellulose Sodium (REFRESH TEARS OP) Place 1 drop into both eyes 2 (two) times daily.   cetirizine (ZYRTEC) 10 MG tablet TAKE 1 TABLET BY MOUTH EVERY DAY AS NEEDED FOR ALLERGY (Patient taking differently: Take 10 mg by mouth daily as needed for allergies.)   clonazePAM (KLONOPIN) 0.5 MG tablet Take 1 tablet (0.5 mg total) by mouth 2 (two) times daily.   DULoxetine (CYMBALTA) 60 MG capsule Take 1 capsule (60 mg total) by mouth daily.   ELIQUIS 5 MG TABS tablet TAKE 1 TABLET BY MOUTH TWICE A DAY (Patient taking differently: Take 5 mg by mouth 2 (two) times daily.)   FeFum-FePoly-FA-B Cmp-C-Biot (FOLIVANE-PLUS) CAPS TAKE ONE CAPSULE BY MOUTH IN THE EARLY MORNING   furosemide (LASIX) 40 MG tablet Take one tablet (40 mg) by mouth every other day alternating with two tablets (80 mg) on the opposite days. (Patient taking differently: Take 40-80 mg by mouth See admin instructions. Take one tablet (40 mg) by mouth every other day alternating with two tablets (80 mg) on the opposite days.)   KLOR-CON M20 20 MEQ tablet TAKE ONE TABLET BY MOUTH DAILY   losartan (COZAAR) 50 MG tablet TAKE 1 TABLET BY MOUTH EVERY DAY   metoprolol tartrate (LOPRESSOR) 50 MG tablet Take 1 tablet (50 mg total) by mouth daily.   Multiple Vitamins-Minerals (ONE-A-DAY WOMENS 50+)  TABS Take 1 tablet by mouth in the morning.   omeprazole (PRILOSEC) 20 MG capsule TAKE 1 CAPSULE BY MOUTH EVERY DAY   pantoprazole (PROTONIX) 40 MG tablet Take 1 tablet (40 mg total) by mouth 2 (two) times daily.   polyethylene glycol (MIRALAX / GLYCOLAX) 17 g packet Take 17 g by mouth daily.   rosuvastatin (CRESTOR) 20 MG tablet Take 1 tablet (20 mg total) by mouth daily at 6 PM.   sodium chloride (OCEAN) 0.65 % SOLN nasal spray Place 1 spray into both nostrils at bedtime.   spironolactone (ALDACTONE) 25 MG tablet NEW PRESCRIPTION REQUEST: TAKE ONE TABLET BY MOUTH DAILY (Patient taking differently: Take 25 mg by mouth daily.)     Allergies:    Tape, Tramadol, and Morphine   Past Medical History:  Diagnosis Date   A-fib (HCC)    Anemia    Anxiety    Bipolar disorder (HCC)    Blood transfusion 1991   autologous pts own blood given    CAD (coronary artery disease)    Cataract    Chest pain    "@ rest, lying down, w/exertion"   CHF (congestive heart failure) (HCC) 07/2020   Chronic back pain    "mostly lower back but I do have upper back pain regularly" (04/06/2018)   COPD (chronic obstructive pulmonary disease) (HCC)    Depression    Esophageal stricture    Fibromyalgia    GERD (gastroesophageal reflux disease)    Heart murmur    "slight" (04/06/2018)   Hiatal hernia    Hyperlipidemia    Patient denies   Hypertension    Internal hemorrhoids    Lumbago    Mitral regurgitation    Osteoarthritis    Osteoporosis    Pneumonia ~ 01/2018   PONV (postoperative nausea and vomiting)    severe ponv, "in the past" (04/06/2018)   Rectal bleeding    Rheumatoid arthritis (HCC)    Spondylosis    TIA (transient ischemic attack) 2013   Urinary incontinence    wears depends    Past Surgical History:  Procedure Laterality Date   ABDOMINAL HYSTERECTOMY  1982   BACK SURGERY     BALLOON DILATION N/A 09/21/2018   Procedure: BALLOON DILATION;  Surgeon: Hilarie Fredrickson, MD;  Location: [facility] ENDOSCOPY;  Service: Endoscopy;  Laterality: N/A;   BALLOON DILATION N/A 08/12/2019   Procedure: BALLOON DILATION;  Surgeon: Lynann Bologna, MD;  Location: [facility] ENDOSCOPY;  Service: Endoscopy;  Laterality: N/A;   BIOPSY  08/12/2019   Procedure: BIOPSY;  Surgeon: Lynann Bologna, MD;  Location: [facility] ENDOSCOPY;  Service: Endoscopy;;   BLADDER SUSPENSION  1980's   BOTOX INJECTION N/A 09/21/2018   Procedure: BOTOX INJECTION;  Surgeon: Hilarie Fredrickson, MD;  Location: [facility] ENDOSCOPY;  Service: Endoscopy;  Laterality: N/A;   BOTOX INJECTION N/A 08/12/2019   Procedure: BOTOX INJECTION;  Surgeon: Lynann Bologna, MD;  Location: [facility] ENDOSCOPY;  Service: Endoscopy;  Laterality:  N/A;   BOTOX INJECTION N/A 09/27/2022   Procedure: BOTOX INJECTION;  Surgeon: Sherrilyn Rist, MD;  Location: [facility] ENDOSCOPY;  Service: Gastroenterology;  Laterality: N/A;   BOTOX INJECTION N/A 02/24/2023   Procedure: BOTOX INJECTION;  Surgeon: Hilarie Fredrickson, MD;  Location: [facility] ENDOSCOPY;  Service: Gastroenterology;  Laterality: N/A;   CATARACT EXTRACTION W/ INTRAOCULAR LENS  IMPLANT, BILATERAL Bilateral 2010   DILATION AND CURETTAGE OF UTERUS  1961   ESOPHAGEAL MANOMETRY N/A 03/25/2018   Procedure: ESOPHAGEAL MANOMETRY (EM);  Surgeon: Marsa Aris  V, MD;  Location: [facility] ENDOSCOPY;  Service: Endoscopy;  Laterality: N/A;   ESOPHAGOGASTRODUODENOSCOPY (EGD) WITH PROPOFOL N/A 04/07/2018   Procedure: ESOPHAGOGASTRODUODENOSCOPY (EGD) WITH PROPOFOL;  Surgeon: Sherrilyn Rist, MD;  Location: [facility] ENDOSCOPY;  Service: Gastroenterology;  Laterality: N/A;   ESOPHAGOGASTRODUODENOSCOPY (EGD) WITH PROPOFOL N/A 09/21/2018   Procedure: ESOPHAGOGASTRODUODENOSCOPY (EGD) WITH PROPOFOL;  Surgeon: Hilarie Fredrickson, MD;  Location: [facility] ENDOSCOPY;  Service: Endoscopy;  Laterality: N/A;   ESOPHAGOGASTRODUODENOSCOPY (EGD) WITH PROPOFOL N/A 08/12/2019   Procedure: ESOPHAGOGASTRODUODENOSCOPY (EGD) WITH PROPOFOL;  Surgeon: Lynann Bologna, MD;  Location: [facility] ENDOSCOPY;  Service: Endoscopy;  Laterality: N/A;   ESOPHAGOGASTRODUODENOSCOPY (EGD) WITH PROPOFOL N/A 09/27/2022   Procedure: ESOPHAGOGASTRODUODENOSCOPY (EGD) WITH PROPOFOL;  Surgeon: Sherrilyn Rist, MD;  Location: [facility] ENDOSCOPY;  Service: Gastroenterology;  Laterality: N/A;   ESOPHAGOGASTRODUODENOSCOPY (EGD) WITH PROPOFOL N/A 02/24/2023   Procedure: ESOPHAGOGASTRODUODENOSCOPY (EGD) WITH PROPOFOL;  Surgeon: Hilarie Fredrickson, MD;  Location: [facility] ENDOSCOPY;  Service: Gastroenterology;  Laterality: N/A;   HERNIA REPAIR     HIATAL HERNIA REPAIR N/A 08/02/2016   Procedure: LAPAROSCOPIC REPAIR OF LARGE  HIATAL HERNIA;  Surgeon: Glenna Fellows, MD;  Location: [facility] ORS;  Service:  General;  Laterality: N/A;   JOINT REPLACEMENT     LAPAROSCOPIC NISSEN FUNDOPLICATION N/A 08/02/2016   Procedure: LAPAROSCOPIC NISSEN FUNDOPLICATION;  Surgeon: Glenna Fellows, MD;  Location: [facility] ORS;  Service: General;  Laterality: N/A;   LUMBAR LAMINECTOMY  1990; 1994; [id number]   "I've got 2 stainless steel rods; 6 screws; 2 ray cages"took bone from right hip to put in back   RIGHT/LEFT HEART CATH AND CORONARY ANGIOGRAPHY N/A 07/26/2019   Procedure: RIGHT/LEFT HEART CATH AND CORONARY ANGIOGRAPHY;  Surgeon: Lyn Records, MD;  Location: [facility] INVASIVE CV LAB;  Service: Cardiovascular;  Laterality: N/A;   SVT ABLATION N/A 04/25/2021   Procedure: SVT ABLATION;  Surgeon: Regan Lemming, MD;  Location: [facility] INVASIVE CV LAB;  Service: Cardiovascular;  Laterality: N/A;   TEE WITHOUT CARDIOVERSION N/A 08/19/2019   Procedure: TRANSESOPHAGEAL ECHOCARDIOGRAM (TEE);  Surgeon: Lewayne Bunting, MD;  Location: [facility] ENDOSCOPY;  Service: Cardiovascular;  Laterality: N/A;   TONSILLECTOMY AND ADENOIDECTOMY  1945   TOTAL KNEE ARTHROPLASTY Right ~ 1996   TUBAL LIGATION  ~ 1976     Social History:  The patient  reports that she has never smoked. She has been exposed to tobacco smoke. She has never used smokeless tobacco. She reports that she does not drink alcohol and does not use drugs.   Family History:  The patient's family history includes Heart attack in her brother; Heart disease in her brother; Heart disease (age of onset: 9) in her father; Hypertension in her mother; Kidney disease in her mother; Other in her son; Suicidality in her son.    ROS:  Please see the history of present illness. All other systems are reviewed and  Negative to the above problem except as noted.    PHYSICAL EXAM: VS:  BP 132/62   Pulse (!) 51   Ht 5' 2.5" (1.588 m)   Wt 154 lb (69.9 kg)   SpO2 93%   BMI 27.72 kg/m   GEN: 87 yo  in no acute distress    Examined in  wheelchair  Neck: JVP is normal   Cardiac: RRR   No  S3  No murmurs   Tr to 1+ LE edema  Wearing support hose  Respiratory:  clear to auscultation bilaterally No rales or wheezes GI: soft, nontender  No  hepatomegaly     EKG:  EKG is not ordered today.    Echo   Feb 2024   1. Left ventricular ejection fraction, by estimation, is 70 to 75%. Left  ventricular ejection fraction by PLAX is 75 %. The left ventricle has  hyperdynamic function. The left ventricle has no regional wall motion  abnormalities. There is mild left  ventricular hypertrophy. Left ventricular diastolic parameters are  consistent with Grade I diastolic dysfunction (impaired relaxation).   2. Right ventricular systolic function is normal. The right ventricular  size is normal. There is normal pulmonary artery systolic pressure. The  estimated right ventricular systolic pressure is 14.6 mmHg.   3. The mitral valve is abnormal. Trivial mitral valve regurgitation.  Moderate to severe mitral annular calcification.   4. The aortic valve is tricuspid. Aortic valve regurgitation is trivial.  Aortic valve sclerosis/calcification is present, without any evidence of  aortic stenosis.   5. The inferior vena cava is normal in size with greater than 50%  respiratory variability, suggesting right atrial pressure of 3 mmHg.   Comparison(s): Changes from prior study are noted. 10/21/2022: LVEF  60-65%, grade 2 DD.    CT coronary angiogram   Nov 2023   Pulmonary veins drain normally to the left atrium. No LA appendage thrombus.   Calcium Score: 457 Agatston units.   Coronary Arteries: Right dominant with no anomalies   LM: Calcified plaque distal left main, mild (1-24%) stenosis.   LAD system: Ostial calcified plaque, mild (1-24%) stenosis. Mixed plaque proximal LAD, mild (25-49%) stenosis.   Circumflex system: No plaque or stenosis.   RCA system: Difficult to assess due to artifact. By using multiple phases, suspect only mild (1-24%) stenosis with calcified plaque  in proximal and mid RCA.   IMPRESSION: 1. Coronary calcium score 457 Agatston units. This places the patient in the 73rd percentile for age and gender, suggesting intermediate risk for future cardiac events.   2. Very difficult images due to artifact, but suspect no more than mild stenosis in the coronary tree.    Lipid Panel    Component Value Date/Time   CHOL 201 (H) 04/04/2023 1542   TRIG 113 04/04/2023 1542   HDL 82 04/04/2023 1542   CHOLHDL 2.5 04/04/2023 1542   CHOLHDL 3.4 09/28/2022 0656   VLDL 25 09/28/2022 0656   LDLCALC 99 04/04/2023 1542   LDLDIRECT 122.2 06/01/2013 0944      Wt Readings from Last 3 Encounters:  08/15/23 154 lb (69.9 kg)  07/24/23 153 lb (69.4 kg)  07/21/23 153 lb 6.4 oz (69.6 kg)      ASSESSMENT AND PLAN:  # HFrEF   -LVEF has normalized     -Last echo in Feb shows LVEF normal  (70 to 75%) -She appears euvolemic on exam today  #PAF    -Recent monitor showed SR with short bursts of SVT and no afib  -Continue prescription drug management with amiodarone 200 mg daily, Toprol XL 50 mg daily and Eliquis 5 mg twice daily with as needed refills -She has not had any bleeding problems on DOAC -Last labs on 07/24/2023 showed a hemoglobin 12.2, serum creatinine 1.36  #ASCAD    -Cardiac cath 07/25/2019 showed 30 to 40% mid LAD with luminal irregularities in the dominant RCA  -mild nonobstructive CAD by CCTA in Nov 2023    -She is now having chest pain which is new for her.  Difficult to decipher as she has fibromyalgia as well as GERD and  esophageal stricture -she is very complicated and is a very poor historian>>she had told Dr. Tenny Craw that she had not had any CP since last Nov but tells me that she has continued to have CP off and on since then but now is having it with any exertion along with radiation to her arms that is new but when looking back at records from 09/2023 she is complaining of the same sx she complained of on that admit when coronary  CTA showed nonobstructive disease.  I suspect that her sx are GI related and not from coronary ischemia -It is unlikely that she is developed obstructive CAD of the LAD in less than a year but could have microvascular disease. -Recommend getting a stress PET CT to rule out ischemia and assess for microvascular disease and if negative for ischemia she needs to go back to GI -Informed Consent   Shared Decision Making/Informed Consent The risks [chest pain, shortness of breath, cardiac arrhythmias, dizziness, blood pressure fluctuations, myocardial infarction, stroke/transient ischemic attack, nausea, vomiting, allergic reaction, radiation exposure, metallic taste sensation and life-threatening complications (estimated to be 1 in 10,000)], benefits (risk stratification, diagnosing coronary artery disease, treatment guidance) and alternatives of a cardiac PET stress test were discussed in detail with Julie Rice and she agrees to proceed.     #SVT   -s/p ablation   -Denies any palpitations -Continue Toprol-XL 50 mg daily  #TIA    -Continue Eliquis 5 mg twice daily   #HLD  -LDL goal less than 70  -Lipids done 04/04/2023 showed LDL 99, HDL 82 HL    -Continue prescription drug management with Crestor 20 mg daily and add Zetia 10 mg daily -Repeat FLP and ALT in 8 weeks  Followup with Dr. Tenny Craw in 4 weeks  Current medicines are reviewed at length with the patient today.  The patient does not have concerns regarding medicines.  Signed, Armanda Magic, MD  08/15/2023 11:52 AM    [facility] [address] [place], [place], [place]  [address] Phone: [phone]; Fax: [phone]

## 2023-08-19 DIAGNOSIS — K219 Gastro-esophageal reflux disease without esophagitis: Secondary | ICD-10-CM | POA: Diagnosis not present

## 2023-08-21 ENCOUNTER — Telehealth: Payer: Self-pay | Admitting: Adult Health

## 2023-08-21 ENCOUNTER — Telehealth: Payer: Self-pay | Admitting: Cardiovascular Disease

## 2023-08-21 ENCOUNTER — Other Ambulatory Visit: Payer: Self-pay | Admitting: *Deleted

## 2023-08-21 ENCOUNTER — Other Ambulatory Visit: Payer: Self-pay | Admitting: Adult Health

## 2023-08-21 DIAGNOSIS — I480 Paroxysmal atrial fibrillation: Secondary | ICD-10-CM

## 2023-08-21 DIAGNOSIS — F419 Anxiety disorder, unspecified: Secondary | ICD-10-CM

## 2023-08-21 MED ORDER — APIXABAN 5 MG PO TABS
5.0000 mg | ORAL_TABLET | Freq: Two times a day (BID) | ORAL | 1 refills | Status: DC
Start: 2023-08-21 — End: 2023-12-12

## 2023-08-21 NOTE — Telephone Encounter (Signed)
Eliquis 5mg  refill request received. Patient is 87 years old, weight-69.9kg, Crea-1.36 on 07/24/23, Diagnosis-Afib, and last seen by Dr. Mayford Knife on 08/15/23 and pending appt on 09/30/23 with Dr. Tenny Craw. Dose is appropriate based on dosing criteria. Will send in refill to requested pharmacy.

## 2023-08-21 NOTE — Telephone Encounter (Signed)
Eliquis 5mg  refill request received. Patient is 87 years old, weight-69.9kg, Crea-1.36 on 07/24/23, Diagnosis-Afib, and last seen by Dr. Mayford Knife on 08/15/23. Dose is appropriate based on dosing criteria. Will send in refill to requested pharmacy.

## 2023-08-21 NOTE — Telephone Encounter (Signed)
*  STAT* If patient is at the pharmacy, call can be transferred to refill team.   1. Which medications need to be refilled? (please list name of each medication and dose if known)   ELIQUIS 5 MG TABS tablet   2. Would you like to learn more about the convenience, safety, & potential cost savings by using the [facility]?   3. Are you open to using the [facility] (Type [facility]. ).  4. Which pharmacy/location (including street and city if local pharmacy) is medication to be sent to?  [facility] - [address], [place] - [address]   5. Do they need a 30 day or 90 day supply? 90 day  Caller (Lauren) stated patient is completely out of this medication.  Patient has appointment scheduled on 11/26.

## 2023-08-21 NOTE — Telephone Encounter (Signed)
error 

## 2023-08-22 ENCOUNTER — Other Ambulatory Visit: Payer: Self-pay | Admitting: Adult Health

## 2023-08-22 DIAGNOSIS — F419 Anxiety disorder, unspecified: Secondary | ICD-10-CM

## 2023-08-22 MED ORDER — CLONAZEPAM 0.5 MG PO TABS
0.5000 mg | ORAL_TABLET | Freq: Two times a day (BID) | ORAL | 2 refills | Status: DC
Start: 2023-08-22 — End: 2023-08-22

## 2023-08-22 MED ORDER — CLONAZEPAM 0.5 MG PO TABS
0.5000 mg | ORAL_TABLET | Freq: Two times a day (BID) | ORAL | 2 refills | Status: DC
Start: 2023-08-22 — End: 2023-11-12

## 2023-08-22 NOTE — Telephone Encounter (Signed)
[facility] ran into a problem with refill and needs approval to amend this Rx   clonazePAM clonazePAM (KLONOPIN) 0.5 MG tablet

## 2023-08-25 NOTE — Addendum Note (Signed)
Addended by: Quintella Reichert on: 08/25/2023 05:59 PM   Modules accepted: Orders

## 2023-08-26 ENCOUNTER — Other Ambulatory Visit: Payer: Self-pay | Admitting: Adult Health

## 2023-08-26 DIAGNOSIS — F419 Anxiety disorder, unspecified: Secondary | ICD-10-CM

## 2023-08-28 ENCOUNTER — Other Ambulatory Visit: Payer: Medicare Other | Admitting: Rheumatology

## 2023-08-28 ENCOUNTER — Encounter (HOSPITAL_COMMUNITY): Payer: Self-pay

## 2023-08-29 ENCOUNTER — Ambulatory Visit (INDEPENDENT_AMBULATORY_CARE_PROVIDER_SITE_OTHER): Payer: Medicare Other | Admitting: Adult Health

## 2023-08-29 ENCOUNTER — Encounter: Payer: Self-pay | Admitting: Adult Health

## 2023-08-29 ENCOUNTER — Telehealth (HOSPITAL_COMMUNITY): Payer: Self-pay | Admitting: Emergency Medicine

## 2023-08-29 VITALS — BP 120/70 | HR 65 | Temp 97.9°F | Ht 62.5 in | Wt 152.0 lb

## 2023-08-29 DIAGNOSIS — Z23 Encounter for immunization: Secondary | ICD-10-CM | POA: Diagnosis not present

## 2023-08-29 DIAGNOSIS — T148XXA Other injury of unspecified body region, initial encounter: Secondary | ICD-10-CM

## 2023-08-29 DIAGNOSIS — I480 Paroxysmal atrial fibrillation: Secondary | ICD-10-CM | POA: Diagnosis not present

## 2023-08-29 DIAGNOSIS — R2681 Unsteadiness on feet: Secondary | ICD-10-CM

## 2023-08-29 MED ORDER — METHYLPREDNISOLONE 4 MG PO TBPK: ORAL_TABLET | ORAL | 0 refills | Status: DC

## 2023-08-29 NOTE — Progress Notes (Signed)
Subjective:    Patient ID: Julie Rice, female    DOB: [birth year], 87 y.o.   MRN: [id number]  HPI 87 year old female who  has a past medical history of A-fib (HCC), Anemia, Anxiety, Bipolar disorder (HCC), Blood transfusion (1991), CAD (coronary artery disease), Cataract, Chest pain, CHF (congestive heart failure) (HCC) (07/2020), Chronic back pain, COPD (chronic obstructive pulmonary disease) (HCC), Depression, Esophageal stricture, Fibromyalgia, GERD (gastroesophageal reflux disease), Heart murmur, Hiatal hernia, Hyperlipidemia, Hypertension, Internal hemorrhoids, Lumbago, Mitral regurgitation, Osteoarthritis, Osteoporosis, Pneumonia (~ 01/2018), PONV (postoperative nausea and vomiting), Rectal bleeding, Rheumatoid arthritis (HCC), Spondylosis, TIA (transient ischemic attack) (2013), and Urinary incontinence.  He presents to the office today for multiple complaints.  The last month or so she has been experiencing pain in her upper back with radiating pain that goes down her right arm from time to time.  He has full range of motion.  Denies any trauma or aggravating injury.  Symptoms have been present on a pretty consistent basis  Furthermore she reports that she has had 2 falls over the last couple weeks.  Her first fall was when she was in her kitchen, she got tripped up when she turned around and fell into a wooden trash can holder.  After the fall she did have some tenderness to her right leg from falling into what the wooden trash can hold her but this is since resolved.  She did not fall to the ground and she did not hit her head on anything during this fall second fall happened about a week ago when she was in her bedroom.  Again she reports that she got tripped up when turning around and fell with half of her body on her bed and the other half of her body in a chair that she has in her bedroom.  She does report falling onto her left arm and elbow and had some mild pain with this but it has  since resolved.  Again she did not hit her head or fall to the ground.  She does report working with physical therapy and is using her walker with ambulation.  She is also concerned because she has not received her shipment from her mail order of her Eliquis that she takes for A-fib.  She has been out of her Eliquis for about a month.  She has not had any chest pain, shortness of breath, or palpitations.  She has not noticed any swelling in her lower extremities.   Review of Systems See HPI   Past Medical History:  Diagnosis Date   A-fib (HCC)    Anemia    Anxiety    Bipolar disorder (HCC)    Blood transfusion 1991   autologous pts own blood given    CAD (coronary artery disease)    Cataract    Chest pain    "@ rest, lying down, w/exertion"   CHF (congestive heart failure) (HCC) 07/2020   Chronic back pain    "mostly lower back but I do have upper back pain regularly" (04/06/2018)   COPD (chronic obstructive pulmonary disease) (HCC)    Depression    Esophageal stricture    Fibromyalgia    GERD (gastroesophageal reflux disease)    Heart murmur    "slight" (04/06/2018)   Hiatal hernia    Hyperlipidemia    Patient denies   Hypertension    Internal hemorrhoids    Lumbago    Mitral regurgitation    Osteoarthritis    Osteoporosis  Pneumonia ~ 01/2018   PONV (postoperative nausea and vomiting)    severe ponv, "in the past" (04/06/2018)   Rectal bleeding    Rheumatoid arthritis (HCC)    Spondylosis    TIA (transient ischemic attack) 2013   Urinary incontinence    wears depends    Social History   Socioeconomic History   Marital status: Widowed    Spouse name: Not on file   Number of children: 2   Years of education: 72   Highest education level: 12th grade  Occupational History   Occupation: retired    Associate Professor: RETIRED  Tobacco Use   Smoking status: Never    Passive exposure: Past   Smokeless tobacco: Never  Vaping Use   Vaping status: Never Used  Substance and  Sexual Activity   Alcohol use: Never   Drug use: Never   Sexual activity: Not Currently    Comment: 1st intercourse 48 yo-1 partner  Other Topics Concern   Not on file  Social History Narrative   Retired    Widowed   Right handed   Comes with sister today   Social Determinants of Health   Financial Resource Strain: Low Risk  (08/26/2023)   Overall Financial Resource Strain (CARDIA)    Difficulty of Paying Living Expenses: Not hard at all  Food Insecurity: No Food Insecurity (08/26/2023)   Hunger Vital Sign    Worried About Running Out of Food in the Last Year: Never true    Ran Out of Food in the Last Year: Never true  Transportation Needs: Unmet Transportation Needs (08/26/2023)   PRAPARE - Transportation    Lack of Transportation (Medical): Yes    Lack of Transportation (Non-Medical): Yes  Physical Activity: Unknown (08/26/2023)   Exercise Vital Sign    Days of Exercise per Week: 0 days    Minutes of Exercise per Session: Not on file  Stress: Stress Concern Present (08/26/2023)   [employer] of Occupational Health - Occupational Stress Questionnaire    Feeling of Stress : Rather much  Social Connections: Moderately Isolated (08/26/2023)   Social Connection and Isolation Panel [NHANES]    Frequency of Communication with Friends and Family: Twice a week    Frequency of Social Gatherings with Friends and Family: Never    Attends Religious Services: 1 to 4 times per year    Active Member of [employer] or Organizations: Yes    Attends Banker Meetings: More than 4 times per year    Marital Status: Widowed  Intimate Partner Violence: Not At Risk (12/08/2022)   Humiliation, Afraid, Rape, and Kick questionnaire    Fear of Current or Ex-Partner: No    Emotionally Abused: No    Physically Abused: No    Sexually Abused: No    Past Surgical History:  Procedure Laterality Date   ABDOMINAL HYSTERECTOMY  1982   BACK SURGERY     BALLOON DILATION N/A 09/21/2018    Procedure: BALLOON DILATION;  Surgeon: Hilarie Fredrickson, MD;  Location: [facility] ENDOSCOPY;  Service: Endoscopy;  Laterality: N/A;   BALLOON DILATION N/A 08/12/2019   Procedure: BALLOON DILATION;  Surgeon: Lynann Bologna, MD;  Location: [facility] ENDOSCOPY;  Service: Endoscopy;  Laterality: N/A;   BIOPSY  08/12/2019   Procedure: BIOPSY;  Surgeon: Lynann Bologna, MD;  Location: [facility] ENDOSCOPY;  Service: Endoscopy;;   BLADDER SUSPENSION  1980's   BOTOX INJECTION N/A 09/21/2018   Procedure: BOTOX INJECTION;  Surgeon: Hilarie Fredrickson, MD;  Location: [facility] ENDOSCOPY;  Service: Endoscopy;  Laterality: N/A;   BOTOX INJECTION N/A 08/12/2019   Procedure: BOTOX INJECTION;  Surgeon: Lynann Bologna, MD;  Location: [facility] ENDOSCOPY;  Service: Endoscopy;  Laterality: N/A;   BOTOX INJECTION N/A 09/27/2022   Procedure: BOTOX INJECTION;  Surgeon: Sherrilyn Rist, MD;  Location: [facility] ENDOSCOPY;  Service: Gastroenterology;  Laterality: N/A;   BOTOX INJECTION N/A 02/24/2023   Procedure: BOTOX INJECTION;  Surgeon: Hilarie Fredrickson, MD;  Location: [facility] ENDOSCOPY;  Service: Gastroenterology;  Laterality: N/A;   CATARACT EXTRACTION W/ INTRAOCULAR LENS  IMPLANT, BILATERAL Bilateral 2010   DILATION AND CURETTAGE OF UTERUS  1961   ESOPHAGEAL MANOMETRY N/A 03/25/2018   Procedure: ESOPHAGEAL MANOMETRY (EM);  Surgeon: Napoleon Form, MD;  Location: [facility] ENDOSCOPY;  Service: Endoscopy;  Laterality: N/A;   ESOPHAGOGASTRODUODENOSCOPY (EGD) WITH PROPOFOL N/A 04/07/2018   Procedure: ESOPHAGOGASTRODUODENOSCOPY (EGD) WITH PROPOFOL;  Surgeon: Sherrilyn Rist, MD;  Location: [facility] ENDOSCOPY;  Service: Gastroenterology;  Laterality: N/A;   ESOPHAGOGASTRODUODENOSCOPY (EGD) WITH PROPOFOL N/A 09/21/2018   Procedure: ESOPHAGOGASTRODUODENOSCOPY (EGD) WITH PROPOFOL;  Surgeon: Hilarie Fredrickson, MD;  Location: [facility] ENDOS[facility];  Service: Endoscopy;  Laterality: N/A;   ESOPHAGOGASTRODUODENOSCOPY (EGD) WITH PROPOFOL N/A 08/12/2019   Procedure: ESOPHAGOGASTRODUODENOSCOPY (EGD) WITH  PROPOFOL;  Surgeon: Lynann Bologna, MD;  Location: [facility] ENDOSCOPY;  Service: Endoscopy;  Laterality: N/A;   ESOPHAGOGASTRODUODENOSCOPY (EGD) WITH PROPOFOL N/A 09/27/2022   Procedure: ESOPHAGOGASTRODUODENOSCOPY (EGD) WITH PROPOFOL;  Surgeon: Sherrilyn Rist, MD;  Location: [facility] ENDOSCOPY;  Service: Gastroenterology;  Laterality: N/A;   ESOPHAGOGASTRODUODENOSCOPY (EGD) WITH PROPOFOL N/A 02/24/2023   Procedure: ESOPHAGOGASTRODUODENOSCOPY (EGD) WITH PROPOFOL;  Surgeon: Hilarie Fredrickson, MD;  Location: [facility] ENDOSCOPY;  Service: Gastroenterology;  Laterality: N/A;   HERNIA REPAIR     HIATAL HERNIA REPAIR N/A 08/02/2016   Procedure: LAPAROSCOPIC REPAIR OF LARGE  HIATAL HERNIA;  Surgeon: Glenna Fellows, MD;  Location: [facility] ORS;  Service: General;  Laterality: N/A;   JOINT REPLACEMENT     LAPAROSCOPIC NISSEN FUNDOPLICATION N/A 08/02/2016   Procedure: LAPAROSCOPIC NISSEN FUNDOPLICATION;  Surgeon: Glenna Fellows, MD;  Location: [facility] ORS;  Service: General;  Laterality: N/A;   LUMBAR LAMINECTOMY  1990; 1994; [id number]   "I've got 2 stainless steel rods; 6 screws; 2 ray cages"took bone from right hip to put in back   RIGHT/LEFT HEART CATH AND CORONARY ANGIOGRAPHY N/A 07/26/2019   Procedure: RIGHT/LEFT HEART CATH AND CORONARY ANGIOGRAPHY;  Surgeon: Lyn Records, MD;  Location: [facility] INVASIVE CV LAB;  Service: Cardiovascular;  Laterality: N/A;   SVT ABLATION N/A 04/25/2021   Procedure: SVT ABLATION;  Surgeon: Regan Lemming, MD;  Location: [facility]VAS[facility] CV LAB;  Service: Cardiovascular;  Laterality: N/A;   TEE WITHOUT CARDIOVERSION N/A 08/19/2019   Procedure: TRANSESOPHAGEAL ECHOCARDIOGRAM (TEE);  Surgeon: Lewayne Bunting, MD;  Location: [facility] ENDOSCOPY;  Service: Cardiovascular;  Laterality: N/A;   TONSILLECTOMY AND ADENOIDECTOMY  1945   TOTAL KNEE ARTHROPLASTY Right ~ 1996   TUBAL LIGATION  ~ 1976    Family History  Problem Relation Age of Onset   Kidney disease Mother    Hypertension Mother    Heart disease  Father 33       die of MI at age 32   Heart disease Brother    Heart attack Brother    Suicidality Son    Other Son        MVA   Colon cancer Neg Hx    Esophageal cancer Neg Hx    Pancreatic cancer Neg Hx  Rectal cancer Neg Hx    Stomach cancer Neg Hx     Allergies  Allergen Reactions   Tape Other (See Comments)    Band aides, adhesive tape; Redness and pulls skin off   Tramadol Other (See Comments)    Pt has prolonged Qtc interval of 540- cannot give tramadol per pharmacy   Morphine Itching, Rash and Other (See Comments)    Flushing    Current Outpatient Medications on File Prior to Visit  Medication Sig Dispense Refill   acetaminophen (TYLENOL) 500 MG tablet Take 1,000 mg by mouth every 6 (six) hours as needed for moderate pain, headache or fever.     albuterol (VENTOLIN HFA) 108 (90 Base) MCG/ACT inhaler INHALE TWO PUFFS BY MOUTH EVERY 6 HOURS AS NEEDED FOR WHEEZING OR SHORTNESS OF BREATH 8.5 g 1   amiodarone (PACERONE) 200 MG tablet NEW PRESCRIPTION REQUEST: TAKE ONE TABLET BY MOUTH DAILY (Patient taking differently: Take 200 mg by mouth daily.) 90 tablet 3   apixaban (ELIQUIS) 5 MG TABS tablet Take 1 tablet (5 mg total) by mouth 2 (two) times daily. 180 tablet 1   buPROPion (WELLBUTRIN XL) 300 MG 24 hr tablet Take 1 tablet (300 mg total) by mouth daily. (Patient taking differently: Take 300 mg by mouth daily as needed (mood).) 90 tablet 1   Carboxymethylcellulose Sodium (REFRESH TEARS OP) Place 1 drop into both eyes 2 (two) times daily.     cetirizine (ZYRTEC) 10 MG tablet TAKE 1 TABLET BY MOUTH EVERY DAY AS NEEDED FOR ALLERGY (Patient taking differently: Take 10 mg by mouth daily as needed for allergies.) 90 tablet 0   clonazePAM (KLONOPIN) 0.5 MG tablet Take 1 tablet (0.5 mg total) by mouth 2 (two) times daily. 60 tablet 2   DULoxetine (CYMBALTA) 60 MG capsule Take 1 capsule (60 mg total) by mouth daily. 90 capsule 1   ezetimibe (ZETIA) 10 MG tablet Take 1 tablet (10 mg  total) by mouth daily. 90 tablet 3   FeFum-FePoly-FA-B Cmp-C-Biot (FOLIVANE-PLUS) CAPS TAKE ONE CAPSULE BY MOUTH IN THE EARLY MORNING 90 capsule 0   furosemide (LASIX) 40 MG tablet Take one tablet (40 mg) by mouth every other day alternating with two tablets (80 mg) on the opposite days. (Patient taking differently: Take 40-80 mg by mouth See admin instructions. Take one tablet (40 mg) by mouth every other day alternating with two tablets (80 mg) on the opposite days.) 90 tablet 3   KLOR-CON M20 20 MEQ tablet TAKE ONE TABLET BY MOUTH DAILY 30 tablet 8   losartan (COZAAR) 50 MG tablet TAKE 1 TABLET BY MOUTH EVERY DAY 90 tablet 1   metoprolol tartrate (LOPRESSOR) 50 MG tablet Take 1 tablet (50 mg total) by mouth daily. 90 tablet 3   Multiple Vitamins-Minerals (ONE-A-DAY WOMENS 50+) TABS Take 1 tablet by mouth in the morning.     omeprazole (PRILOSEC) 20 MG capsule TAKE 1 CAPSULE BY MOUTH EVERY DAY 90 capsule 3   pantoprazole (PROTONIX) 40 MG tablet Take 1 tablet (40 mg total) by mouth 2 (two) times daily. 60 tablet 11   polyethylene glycol (MIRALAX / GLYCOLAX) 17 g packet Take 17 g by mouth daily.     rosuvastatin (CRESTOR) 20 MG tablet Take 1 tablet (20 mg total) by mouth daily at 6 PM. 90 tablet 1   sodium chloride (OCEAN) 0.65 % SOLN nasal spray Place 1 spray into both nostrils at bedtime.     spironolactone (ALDACTONE) 25 MG tablet NEW PRESCRIPTION REQUEST:  TAKE ONE TABLET BY MOUTH DAILY (Patient taking differently: Take 25 mg by mouth daily.) 90 tablet 3   No current facility-administered medications on file prior to visit.    BP 120/70   Pulse 65   Temp 97.9 F (36.6 C) (Oral)   Ht 5' 2.5" (1.588 m)   Wt 152 lb (68.9 kg)   SpO2 95%   BMI 27.36 kg/m       Objective:   Physical Exam Vitals and nursing note reviewed.  Constitutional:      Appearance: Normal appearance.  Cardiovascular:     Rate and Rhythm: Normal rate and regular rhythm.     Pulses: Normal pulses.     Heart  sounds: Normal heart sounds.  Pulmonary:     Effort: Pulmonary effort is normal.     Breath sounds: Normal breath sounds.  Musculoskeletal:        General: Tenderness (bilateral trapezius muscles) present. Normal range of motion.  Skin:    General: Skin is warm and dry.  Neurological:     Mental Status: She is alert.  Psychiatric:        Mood and Affect: Mood normal.        Behavior: Behavior normal.        Thought Content: Thought content normal.        Judgment: Judgment normal.      Assessment & Plan:  1. Muscle strain -Her discomfort seems to be more muscular in origin.  Will prescribe Medrol Dosepak.  She was encouraged stretching exercises and warm compress.  Follow-up if not resolving in the next week - methylPREDNISolone (MEDROL DOSEPAK) 4 MG TBPK tablet; Take as directed  Dispense: 21 tablet; Refill: 0  2. Need for influenza vaccination  - Flu Vaccine Trivalent High Dose (Fluad)  3. Gait instability -Was precaution reviewed.  Continue to work with physical therapy on gait strengthening  4. PAF (paroxysmal atrial fibrillation) (HCC) Medication Samples have been provided to the patient.  Drug name: Elqiuis       Strength: 5 mg       Qty: 4 boxes   LOT: [id number]  Exp.Date: 12/25  Dosing instructions: take 5 mg twice daily   The patient has been instructed regarding the correct time, dose, and frequency of taking this medication, including desired effects and most common side effects.   Raymonda Pell 2:28 PM 08/29/2023

## 2023-08-29 NOTE — Telephone Encounter (Signed)
Julie Rice with [facility] need verbal ok to resend the clonazepam. Pt never received the medication

## 2023-08-29 NOTE — Telephone Encounter (Signed)
Attempted to call patient regarding upcoming cardiac PET appointment. Left message on voicemail with name and callback number [name] RN Navigator Cardiac Imaging [facility] [phone] Office [phone] Cell  

## 2023-09-01 ENCOUNTER — Telehealth (HOSPITAL_COMMUNITY): Payer: Self-pay | Admitting: *Deleted

## 2023-09-01 NOTE — Telephone Encounter (Signed)
Reaching out to patient to offer assistance regarding upcoming cardiac imaging study; pt verbalizes understanding of appt date/time, parking situation and where to check in, pre-test NPO status and medications ordered, and verified current allergies; name and call back number provided for further questions should they arise Johney Frame RN Navigator Cardiac Imaging [facility] [phone] office [phone] cell  Patient aware to avoid caffeine for 12 hours prior to test.

## 2023-09-02 ENCOUNTER — Encounter (HOSPITAL_COMMUNITY)
Admission: RE | Admit: 2023-09-02 | Discharge: 2023-09-02 | Disposition: A | Discharge status: Home / Self Care | Payer: Medicare Other | Source: Ambulatory Visit | Attending: Cardiology | Admitting: Cardiology

## 2023-09-02 DIAGNOSIS — R079 Chest pain, unspecified: Secondary | ICD-10-CM | POA: Insufficient documentation

## 2023-09-02 LAB — NM PET CT CARDIAC PERFUSION MULTI W/ABSOLUTE BLOODFLOW
LV dias vol: 75 mL (ref 46–106)
LV sys vol: 29 mL
MBFR: 1.93
Nuc Rest EF: 61 %
Nuc Stress EF: 64 %
Peak HR: 79 {beats}/min
Rest HR: 72 {beats}/min
Rest MBF: 1.2 ml/g/min
Rest Nuclear Isotope Dose: 17.9 mCi
ST Depression (mm): 0 mm
Stress MBF: 2.32 ml/g/min
Stress Nuclear Isotope Dose: 17.9 mCi

## 2023-09-02 MED ORDER — RUBIDIUM RB82 GENERATOR (RUBYFILL)
18.2400 | PACK | Freq: Once | INTRAVENOUS | Status: AC
  Administered 2023-09-02: 17.88 via INTRAVENOUS

## 2023-09-02 MED ORDER — REGADENOSON 0.4 MG/5ML IV SOLN
INTRAVENOUS | Status: AC
  Filled 2023-09-02: qty 5

## 2023-09-02 MED ORDER — RUBIDIUM RB82 GENERATOR (RUBYFILL)
18.2400 | PACK | Freq: Once | INTRAVENOUS | Status: AC
  Administered 2023-09-02: 17.91 via INTRAVENOUS

## 2023-09-02 MED ORDER — REGADENOSON 0.4 MG/5ML IV SOLN
0.4000 mg | Freq: Once | INTRAVENOUS | Status: AC
  Administered 2023-09-02: 0.4 mg via INTRAVENOUS

## 2023-09-03 ENCOUNTER — Ambulatory Visit: Payer: Medicare Other | Attending: Internal Medicine

## 2023-09-03 ENCOUNTER — Telehealth: Payer: Self-pay | Admitting: Adult Health

## 2023-09-03 ENCOUNTER — Other Ambulatory Visit: Payer: Self-pay

## 2023-09-03 DIAGNOSIS — I480 Paroxysmal atrial fibrillation: Secondary | ICD-10-CM

## 2023-09-03 DIAGNOSIS — I471 Supraventricular tachycardia, unspecified: Secondary | ICD-10-CM

## 2023-09-03 NOTE — Telephone Encounter (Signed)
Requesting early fill for  clonazePAM (KLONOPIN) 0.5 MG tablet

## 2023-09-03 NOTE — Progress Notes (Unsigned)
Enrolled patient for a 3 day Zio XT monitor to be mailed to patients home  

## 2023-09-04 ENCOUNTER — Other Ambulatory Visit: Payer: Self-pay | Admitting: Adult Health

## 2023-09-04 DIAGNOSIS — T148XXA Other injury of unspecified body region, initial encounter: Secondary | ICD-10-CM

## 2023-09-04 NOTE — Telephone Encounter (Signed)
This was already refilled.

## 2023-09-09 ENCOUNTER — Other Ambulatory Visit: Payer: Medicare Other | Admitting: Rheumatology

## 2023-09-09 DIAGNOSIS — M0609 Rheumatoid arthritis without rheumatoid factor, multiple sites: Secondary | ICD-10-CM

## 2023-09-09 DIAGNOSIS — M79642 Pain in left hand: Secondary | ICD-10-CM

## 2023-09-16 DIAGNOSIS — I2510 Atherosclerotic heart disease of native coronary artery without angina pectoris: Secondary | ICD-10-CM | POA: Diagnosis not present

## 2023-09-16 DIAGNOSIS — N183 Chronic kidney disease, stage 3 unspecified: Secondary | ICD-10-CM | POA: Diagnosis not present

## 2023-09-16 DIAGNOSIS — E261 Secondary hyperaldosteronism: Secondary | ICD-10-CM | POA: Diagnosis not present

## 2023-09-16 DIAGNOSIS — I5032 Chronic diastolic (congestive) heart failure: Secondary | ICD-10-CM | POA: Diagnosis not present

## 2023-09-28 IMAGING — DX DG HIP (WITH OR WITHOUT PELVIS) 2-3V*R*
3 series · 3 of 3 positions shown · non-contrast
Comparison: MRI lumbar spine 02/26/2020.  CT 08/05/2017.

CLINICAL DATA: Right hip pain.

EXAM:
DG HIP (WITH OR WITHOUT PELVIS) 2-3V RIGHT

[pelvis ap]
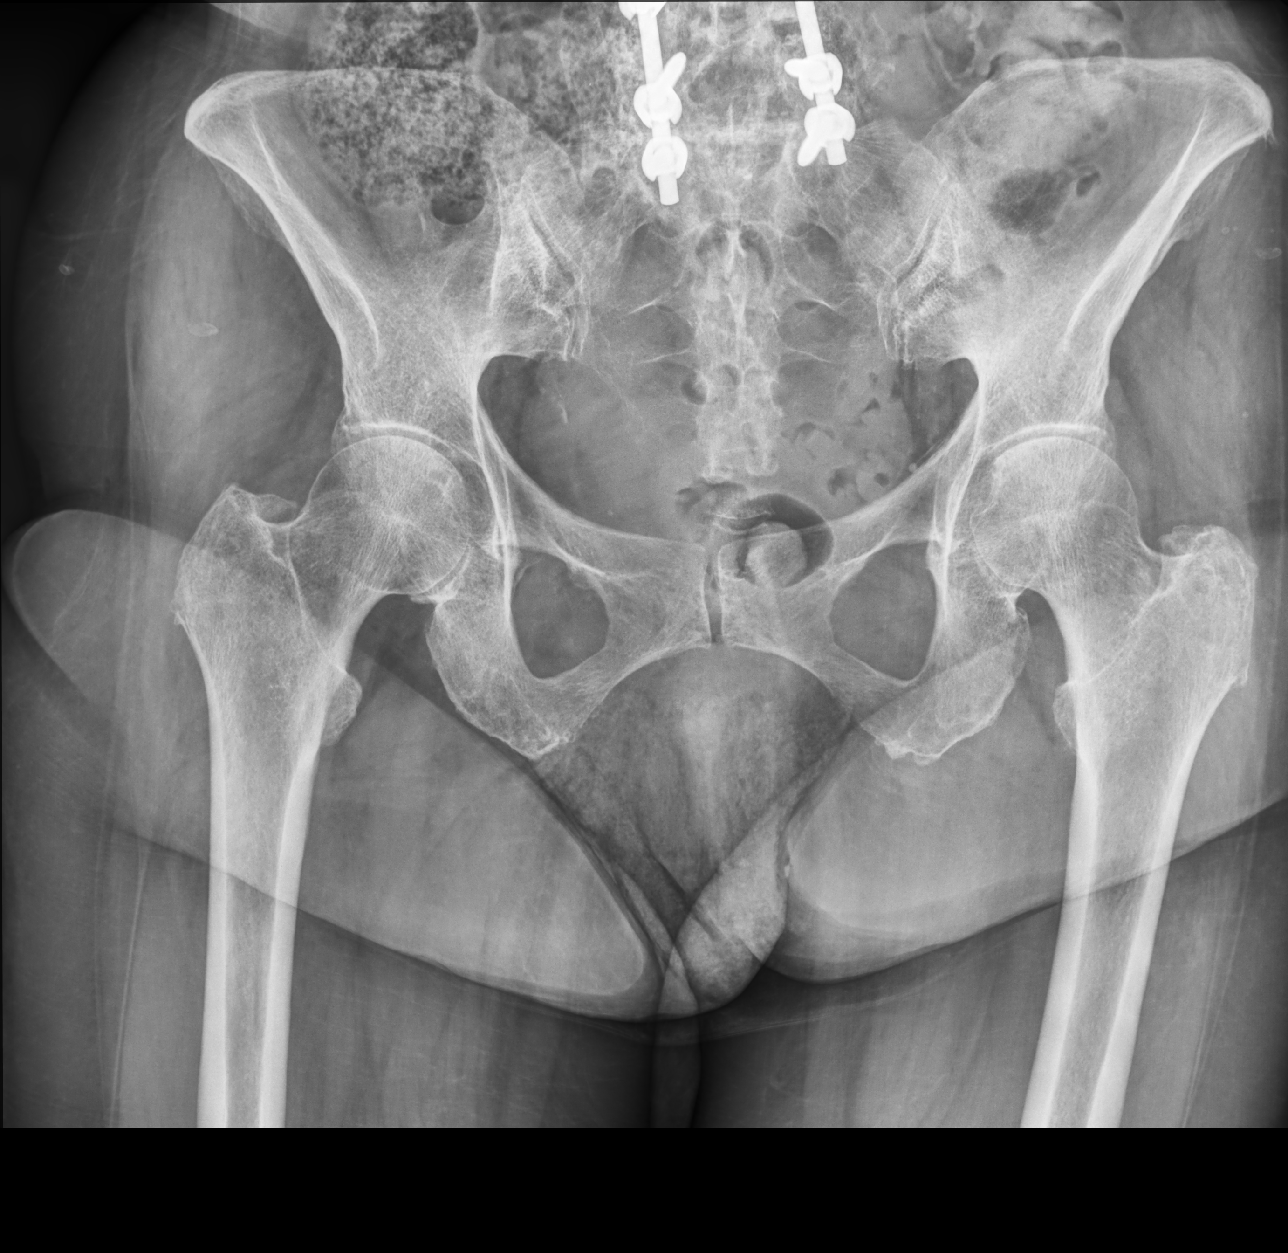

[hip joint ap]
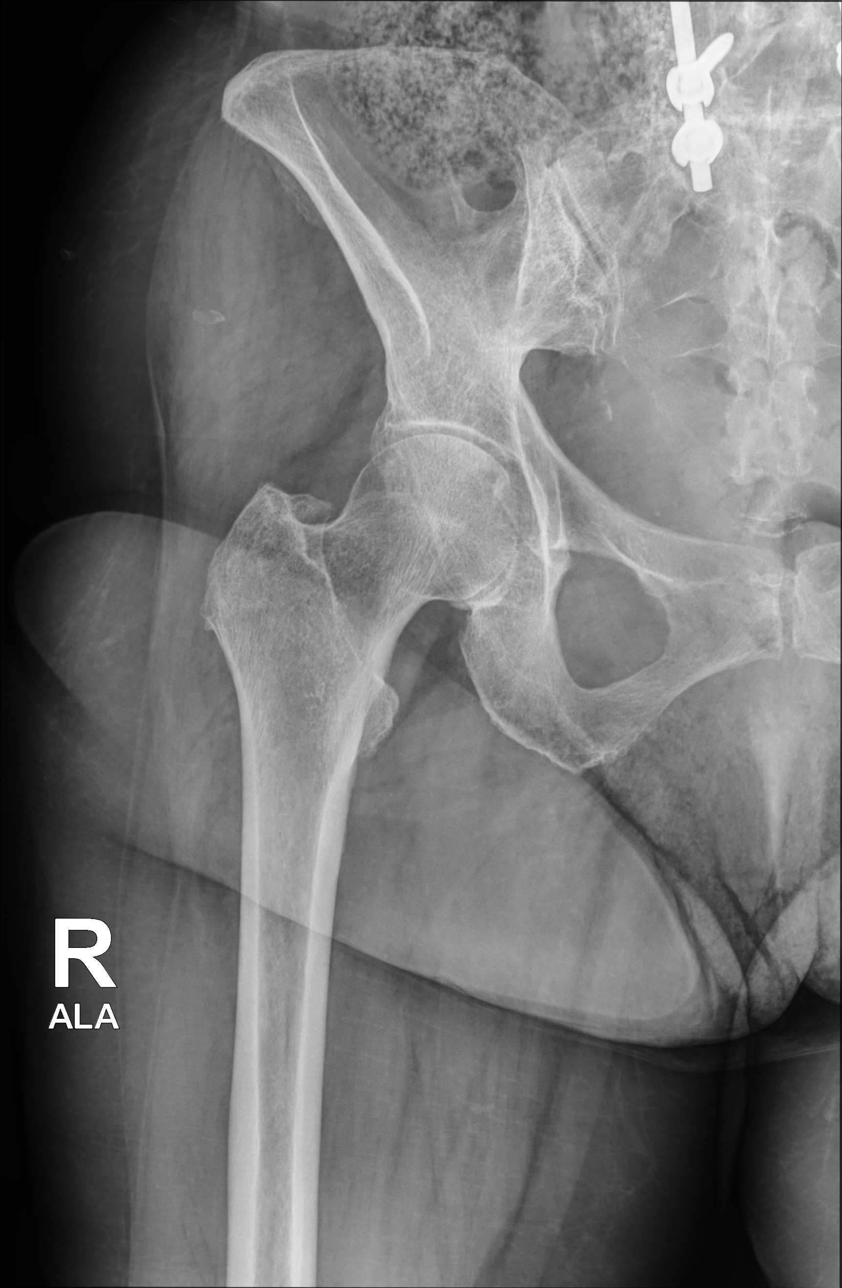

[hip (frog leg) lat]
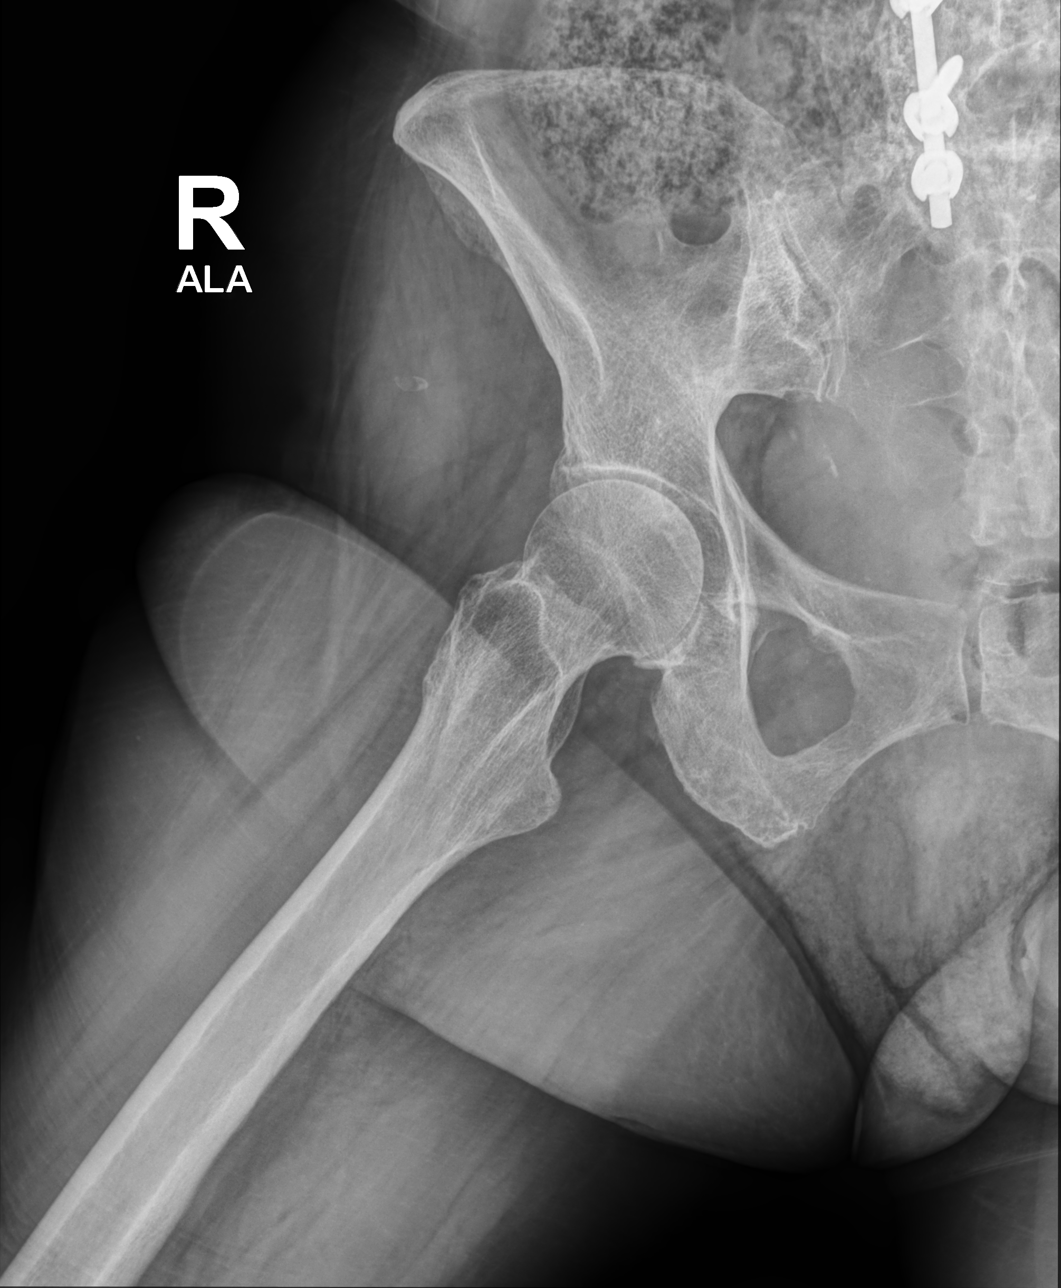

[3 of 3 positions shown; findings below may reference images not displayed]

FINDINGS: Prior lumbar fusion. Hardware intact. Loosening of the lumbosacral
screws cannot be excluded. Bony defect noted over the right ilium,
most likely postsurgical. Degenerative changes lumbar spine, both SI
joints, both hips. No acute bony or joint abnormality identified. No
evidence of fracture or dislocation. Pelvic calcifications
consistent phleboliths. Mild iliac atherosclerotic vascular
calcification. Calcified injection granulomas noted over both
buttocks.
IMPRESSION: 1. Lower lumbar fusion. Loosening of lumbo sacral screws cannot be
excluded.

2. Degenerative changes lumbar spine, both SI joints, both hips. No
acute bony abnormality identified. Right hip is intact.

## 2023-09-30 ENCOUNTER — Ambulatory Visit: Payer: Medicare Other | Admitting: Internal Medicine

## 2023-10-10 ENCOUNTER — Ambulatory Visit: Payer: Medicare Other

## 2023-10-10 DIAGNOSIS — E7800 Pure hypercholesterolemia, unspecified: Secondary | ICD-10-CM

## 2023-10-10 DIAGNOSIS — Z79899 Other long term (current) drug therapy: Secondary | ICD-10-CM

## 2023-10-30 ENCOUNTER — Ambulatory Visit: Payer: Medicare Other | Admitting: Internal Medicine

## 2023-11-03 ENCOUNTER — Other Ambulatory Visit: Payer: Self-pay

## 2023-11-03 ENCOUNTER — Encounter (HOSPITAL_COMMUNITY): Payer: Self-pay

## 2023-11-03 ENCOUNTER — Inpatient Hospital Stay (HOSPITAL_COMMUNITY)
Admission: EM | Admit: 2023-11-03 | Discharge: 2023-11-07 | DRG: 682 | Disposition: A | Discharge status: Home Health | Payer: Medicare Other | Attending: Family Medicine | Admitting: Family Medicine

## 2023-11-03 ENCOUNTER — Emergency Department (HOSPITAL_COMMUNITY): Payer: Medicare Other

## 2023-11-03 DIAGNOSIS — M797 Fibromyalgia: Secondary | ICD-10-CM | POA: Diagnosis present

## 2023-11-03 DIAGNOSIS — N1832 Chronic kidney disease, stage 3b: Secondary | ICD-10-CM | POA: Diagnosis present

## 2023-11-03 DIAGNOSIS — I480 Paroxysmal atrial fibrillation: Secondary | ICD-10-CM | POA: Diagnosis present

## 2023-11-03 DIAGNOSIS — F319 Bipolar disorder, unspecified: Secondary | ICD-10-CM | POA: Diagnosis not present

## 2023-11-03 DIAGNOSIS — E039 Hypothyroidism, unspecified: Secondary | ICD-10-CM | POA: Diagnosis not present

## 2023-11-03 DIAGNOSIS — J449 Chronic obstructive pulmonary disease, unspecified: Secondary | ICD-10-CM | POA: Diagnosis present

## 2023-11-03 DIAGNOSIS — Z8673 Personal history of transient ischemic attack (TIA), and cerebral infarction without residual deficits: Secondary | ICD-10-CM

## 2023-11-03 DIAGNOSIS — I2510 Atherosclerotic heart disease of native coronary artery without angina pectoris: Secondary | ICD-10-CM | POA: Diagnosis present

## 2023-11-03 DIAGNOSIS — E785 Hyperlipidemia, unspecified: Secondary | ICD-10-CM | POA: Diagnosis present

## 2023-11-03 DIAGNOSIS — R42 Dizziness and giddiness: Secondary | ICD-10-CM | POA: Diagnosis not present

## 2023-11-03 DIAGNOSIS — G9341 Metabolic encephalopathy: Secondary | ICD-10-CM | POA: Diagnosis not present

## 2023-11-03 DIAGNOSIS — R001 Bradycardia, unspecified: Secondary | ICD-10-CM

## 2023-11-03 DIAGNOSIS — I130 Hypertensive heart and chronic kidney disease with heart failure and stage 1 through stage 4 chronic kidney disease, or unspecified chronic kidney disease: Secondary | ICD-10-CM | POA: Diagnosis present

## 2023-11-03 DIAGNOSIS — R5381 Other malaise: Secondary | ICD-10-CM | POA: Diagnosis present

## 2023-11-03 DIAGNOSIS — R0902 Hypoxemia: Secondary | ICD-10-CM | POA: Diagnosis not present

## 2023-11-03 DIAGNOSIS — F411 Generalized anxiety disorder: Secondary | ICD-10-CM | POA: Diagnosis present

## 2023-11-03 DIAGNOSIS — Z1152 Encounter for screening for COVID-19: Secondary | ICD-10-CM

## 2023-11-03 DIAGNOSIS — Z91048 Other nonmedicinal substance allergy status: Secondary | ICD-10-CM

## 2023-11-03 DIAGNOSIS — Z66 Do not resuscitate: Secondary | ICD-10-CM | POA: Diagnosis not present

## 2023-11-03 DIAGNOSIS — G894 Chronic pain syndrome: Secondary | ICD-10-CM | POA: Diagnosis present

## 2023-11-03 DIAGNOSIS — I951 Orthostatic hypotension: Secondary | ICD-10-CM | POA: Diagnosis present

## 2023-11-03 DIAGNOSIS — Z885 Allergy status to narcotic agent status: Secondary | ICD-10-CM

## 2023-11-03 DIAGNOSIS — R4182 Altered mental status, unspecified: Secondary | ICD-10-CM | POA: Diagnosis not present

## 2023-11-03 DIAGNOSIS — I739 Peripheral vascular disease, unspecified: Secondary | ICD-10-CM | POA: Diagnosis present

## 2023-11-03 DIAGNOSIS — J9611 Chronic respiratory failure with hypoxia: Secondary | ICD-10-CM | POA: Diagnosis not present

## 2023-11-03 DIAGNOSIS — G9340 Encephalopathy, unspecified: Secondary | ICD-10-CM

## 2023-11-03 DIAGNOSIS — G250 Essential tremor: Secondary | ICD-10-CM | POA: Diagnosis present

## 2023-11-03 DIAGNOSIS — Z96651 Presence of right artificial knee joint: Secondary | ICD-10-CM | POA: Diagnosis present

## 2023-11-03 DIAGNOSIS — F3340 Major depressive disorder, recurrent, in remission, unspecified: Secondary | ICD-10-CM

## 2023-11-03 DIAGNOSIS — R000 Tachycardia, unspecified: Secondary | ICD-10-CM | POA: Diagnosis not present

## 2023-11-03 DIAGNOSIS — Z961 Presence of intraocular lens: Secondary | ICD-10-CM | POA: Diagnosis present

## 2023-11-03 DIAGNOSIS — E860 Dehydration: Secondary | ICD-10-CM | POA: Diagnosis not present

## 2023-11-03 DIAGNOSIS — Z841 Family history of disorders of kidney and ureter: Secondary | ICD-10-CM

## 2023-11-03 DIAGNOSIS — Z7901 Long term (current) use of anticoagulants: Secondary | ICD-10-CM

## 2023-11-03 DIAGNOSIS — Z9841 Cataract extraction status, right eye: Secondary | ICD-10-CM

## 2023-11-03 DIAGNOSIS — I10 Essential (primary) hypertension: Secondary | ICD-10-CM

## 2023-11-03 DIAGNOSIS — K219 Gastro-esophageal reflux disease without esophagitis: Secondary | ICD-10-CM | POA: Diagnosis present

## 2023-11-03 DIAGNOSIS — Z79899 Other long term (current) drug therapy: Secondary | ICD-10-CM | POA: Diagnosis not present

## 2023-11-03 DIAGNOSIS — Z9842 Cataract extraction status, left eye: Secondary | ICD-10-CM

## 2023-11-03 DIAGNOSIS — N179 Acute kidney failure, unspecified: Principal | ICD-10-CM

## 2023-11-03 DIAGNOSIS — M069 Rheumatoid arthritis, unspecified: Secondary | ICD-10-CM | POA: Diagnosis not present

## 2023-11-03 DIAGNOSIS — R55 Syncope and collapse: Secondary | ICD-10-CM | POA: Diagnosis not present

## 2023-11-03 DIAGNOSIS — Z888 Allergy status to other drugs, medicaments and biological substances status: Secondary | ICD-10-CM

## 2023-11-03 DIAGNOSIS — Z8249 Family history of ischemic heart disease and other diseases of the circulatory system: Secondary | ICD-10-CM | POA: Diagnosis not present

## 2023-11-03 DIAGNOSIS — F339 Major depressive disorder, recurrent, unspecified: Secondary | ICD-10-CM | POA: Diagnosis present

## 2023-11-03 DIAGNOSIS — I5032 Chronic diastolic (congestive) heart failure: Secondary | ICD-10-CM | POA: Diagnosis present

## 2023-11-03 LAB — COMPREHENSIVE METABOLIC PANEL
ALT: 19 U/L (ref 0–44)
AST: 36 U/L (ref 15–41)
Albumin: 3.3 g/dL — ABNORMAL LOW (ref 3.5–5.0)
Alkaline Phosphatase: 60 U/L (ref 38–126)
Anion gap: 11 (ref 5–15)
BUN: 40 mg/dL — ABNORMAL HIGH (ref 8–23)
CO2: 23 mmol/L (ref 22–32)
Calcium: 8.6 mg/dL — ABNORMAL LOW (ref 8.9–10.3)
Chloride: 104 mmol/L (ref 98–111)
Creatinine, Ser: 1.93 mg/dL — ABNORMAL HIGH (ref 0.44–1.00)
GFR, Estimated: 25 mL/min — ABNORMAL LOW (ref >60)
Glucose, Bld: 121 mg/dL — ABNORMAL HIGH (ref 70–99)
Potassium: 4.9 mmol/L (ref 3.5–5.1)
Sodium: 138 mmol/L (ref 135–145)
Total Bilirubin: 1 mg/dL (ref 0.0–1.2)
Total Protein: 6.2 g/dL — ABNORMAL LOW (ref 6.5–8.1)

## 2023-11-03 LAB — TROPONIN I (HIGH SENSITIVITY)
Troponin I (High Sensitivity): 12 ng/L (ref <18)
Troponin I (High Sensitivity): 13 ng/L (ref <18)

## 2023-11-03 LAB — CBC WITH DIFFERENTIAL/PLATELET
Abs Immature Granulocytes: 0.14 10*3/uL — ABNORMAL HIGH (ref 0.00–0.07)
Basophils Absolute: 0.1 10*3/uL (ref 0.0–0.1)
Basophils Relative: 1 %
Eosinophils Absolute: 0.2 10*3/uL (ref 0.0–0.5)
Eosinophils Relative: 2 %
HCT: 35.5 % — ABNORMAL LOW (ref 36.0–46.0)
Hemoglobin: 11.3 g/dL — ABNORMAL LOW (ref 12.0–15.0)
Immature Granulocytes: 1 %
Lymphocytes Relative: 8 %
Lymphs Abs: 1.1 10*3/uL (ref 0.7–4.0)
MCH: 32.2 pg (ref 26.0–34.0)
MCHC: 31.8 g/dL (ref 30.0–36.0)
MCV: 101.1 fL — ABNORMAL HIGH (ref 80.0–100.0)
Monocytes Absolute: 0.6 10*3/uL (ref 0.1–1.0)
Monocytes Relative: 5 %
Neutro Abs: 11.1 10*3/uL — ABNORMAL HIGH (ref 1.7–7.7)
Neutrophils Relative %: 83 %
Platelets: 278 10*3/uL (ref 150–400)
RBC: 3.51 MIL/uL — ABNORMAL LOW (ref 3.87–5.11)
RDW: 15.5 % (ref 11.5–15.5)
WBC: 13.2 10*3/uL — ABNORMAL HIGH (ref 4.0–10.5)
nRBC: 0 % (ref 0.0–0.2)

## 2023-11-03 LAB — CBG MONITORING, ED: Glucose-Capillary: 122 mg/dL — ABNORMAL HIGH (ref 70–99)

## 2023-11-03 MED ORDER — SODIUM CHLORIDE 0.9 % IV BOLUS
500.0000 mL | Freq: Once | INTRAVENOUS | Status: AC
  Administered 2023-11-03: 500 mL via INTRAVENOUS

## 2023-11-03 NOTE — ED Notes (Signed)
Called lab, stated that they will run second troponin off of the CMP redraw that was sent.

## 2023-11-03 NOTE — ED Notes (Signed)
Patient transported to CT 

## 2023-11-03 NOTE — ED Triage Notes (Signed)
Patient was at home when her neighbor went to check on her. Neighbor found her trembling and "semiresponive". Patient was hypoxic on Fire arrival with O2 sat of 90% on RA and high 80s. Rales was heard bilaterally by EMS. Orthostatic hypotension was noted by EMS with a neat syncopal episode.  EMS VS 114/53 lying 52 HR 100% 3L [place]

## 2023-11-03 NOTE — ED Notes (Signed)
Patient denied having a pacemarker.

## 2023-11-04 ENCOUNTER — Inpatient Hospital Stay (HOSPITAL_COMMUNITY): Payer: Medicare Other

## 2023-11-04 ENCOUNTER — Encounter (HOSPITAL_COMMUNITY): Payer: Self-pay | Admitting: Family Medicine

## 2023-11-04 DIAGNOSIS — F319 Bipolar disorder, unspecified: Secondary | ICD-10-CM | POA: Diagnosis present

## 2023-11-04 DIAGNOSIS — G9341 Metabolic encephalopathy: Secondary | ICD-10-CM | POA: Diagnosis present

## 2023-11-04 DIAGNOSIS — R4182 Altered mental status, unspecified: Secondary | ICD-10-CM | POA: Diagnosis not present

## 2023-11-04 DIAGNOSIS — E860 Dehydration: Secondary | ICD-10-CM | POA: Diagnosis present

## 2023-11-04 DIAGNOSIS — N1832 Chronic kidney disease, stage 3b: Secondary | ICD-10-CM | POA: Diagnosis present

## 2023-11-04 DIAGNOSIS — R55 Syncope and collapse: Secondary | ICD-10-CM

## 2023-11-04 DIAGNOSIS — Z96651 Presence of right artificial knee joint: Secondary | ICD-10-CM | POA: Diagnosis present

## 2023-11-04 DIAGNOSIS — N179 Acute kidney failure, unspecified: Secondary | ICD-10-CM | POA: Diagnosis present

## 2023-11-04 DIAGNOSIS — E039 Hypothyroidism, unspecified: Secondary | ICD-10-CM | POA: Diagnosis present

## 2023-11-04 DIAGNOSIS — G9340 Encephalopathy, unspecified: Secondary | ICD-10-CM | POA: Diagnosis present

## 2023-11-04 DIAGNOSIS — I2510 Atherosclerotic heart disease of native coronary artery without angina pectoris: Secondary | ICD-10-CM | POA: Diagnosis present

## 2023-11-04 DIAGNOSIS — J9611 Chronic respiratory failure with hypoxia: Secondary | ICD-10-CM | POA: Diagnosis present

## 2023-11-04 DIAGNOSIS — M797 Fibromyalgia: Secondary | ICD-10-CM | POA: Diagnosis present

## 2023-11-04 DIAGNOSIS — M069 Rheumatoid arthritis, unspecified: Secondary | ICD-10-CM | POA: Diagnosis present

## 2023-11-04 DIAGNOSIS — J449 Chronic obstructive pulmonary disease, unspecified: Secondary | ICD-10-CM | POA: Diagnosis present

## 2023-11-04 DIAGNOSIS — Z1152 Encounter for screening for COVID-19: Secondary | ICD-10-CM | POA: Diagnosis not present

## 2023-11-04 DIAGNOSIS — F411 Generalized anxiety disorder: Secondary | ICD-10-CM | POA: Diagnosis present

## 2023-11-04 DIAGNOSIS — I951 Orthostatic hypotension: Secondary | ICD-10-CM | POA: Diagnosis present

## 2023-11-04 DIAGNOSIS — I480 Paroxysmal atrial fibrillation: Secondary | ICD-10-CM | POA: Diagnosis present

## 2023-11-04 DIAGNOSIS — I739 Peripheral vascular disease, unspecified: Secondary | ICD-10-CM | POA: Diagnosis present

## 2023-11-04 DIAGNOSIS — Z8249 Family history of ischemic heart disease and other diseases of the circulatory system: Secondary | ICD-10-CM | POA: Diagnosis not present

## 2023-11-04 DIAGNOSIS — G894 Chronic pain syndrome: Secondary | ICD-10-CM | POA: Diagnosis present

## 2023-11-04 DIAGNOSIS — I130 Hypertensive heart and chronic kidney disease with heart failure and stage 1 through stage 4 chronic kidney disease, or unspecified chronic kidney disease: Secondary | ICD-10-CM | POA: Diagnosis present

## 2023-11-04 DIAGNOSIS — Z66 Do not resuscitate: Secondary | ICD-10-CM | POA: Diagnosis present

## 2023-11-04 DIAGNOSIS — I5032 Chronic diastolic (congestive) heart failure: Secondary | ICD-10-CM | POA: Diagnosis present

## 2023-11-04 DIAGNOSIS — R001 Bradycardia, unspecified: Secondary | ICD-10-CM | POA: Diagnosis present

## 2023-11-04 DIAGNOSIS — E785 Hyperlipidemia, unspecified: Secondary | ICD-10-CM | POA: Diagnosis present

## 2023-11-04 DIAGNOSIS — Z79899 Other long term (current) drug therapy: Secondary | ICD-10-CM | POA: Diagnosis not present

## 2023-11-04 LAB — CBC
HCT: 33.7 % — ABNORMAL LOW (ref 36.0–46.0)
Hemoglobin: 10.6 g/dL — ABNORMAL LOW (ref 12.0–15.0)
MCH: 32.2 pg (ref 26.0–34.0)
MCHC: 31.5 g/dL (ref 30.0–36.0)
MCV: 102.4 fL — ABNORMAL HIGH (ref 80.0–100.0)
Platelets: 245 10*3/uL (ref 150–400)
RBC: 3.29 MIL/uL — ABNORMAL LOW (ref 3.87–5.11)
RDW: 15.2 % (ref 11.5–15.5)
WBC: 12.6 10*3/uL — ABNORMAL HIGH (ref 4.0–10.5)
nRBC: 0 % (ref 0.0–0.2)

## 2023-11-04 LAB — RESP PANEL BY RT-PCR (RSV, FLU A&B, COVID)  RVPGX2
Influenza A by PCR: NEGATIVE
Influenza B by PCR: NEGATIVE
Resp Syncytial Virus by PCR: NEGATIVE
SARS Coronavirus 2 by RT PCR: NEGATIVE

## 2023-11-04 LAB — ECHOCARDIOGRAM COMPLETE
Area-P 1/2: 2.42 cm2
Calc EF: 71.6 %
Height: 63 in
S' Lateral: 2.4 cm
Single Plane A2C EF: 69.8 %
Single Plane A4C EF: 72.4 %
Weight: 2320 [oz_av]

## 2023-11-04 LAB — URINALYSIS, ROUTINE W REFLEX MICROSCOPIC
Bilirubin Urine: NEGATIVE
Glucose, UA: NEGATIVE mg/dL
Hgb urine dipstick: NEGATIVE
Ketones, ur: NEGATIVE mg/dL
Nitrite: NEGATIVE
Protein, ur: NEGATIVE mg/dL
Specific Gravity, Urine: 1.013 (ref 1.005–1.030)
WBC, UA: >50 WBC/hpf (ref 0–5)
pH: 5 (ref 5.0–8.0)

## 2023-11-04 LAB — TSH: TSH: 4.336 u[IU]/mL (ref 0.350–4.500)

## 2023-11-04 LAB — BASIC METABOLIC PANEL
Anion gap: 8 (ref 5–15)
BUN: 40 mg/dL — ABNORMAL HIGH (ref 8–23)
CO2: 23 mmol/L (ref 22–32)
Calcium: 8.5 mg/dL — ABNORMAL LOW (ref 8.9–10.3)
Chloride: 105 mmol/L (ref 98–111)
Creatinine, Ser: 1.87 mg/dL — ABNORMAL HIGH (ref 0.44–1.00)
GFR, Estimated: 26 mL/min — ABNORMAL LOW (ref >60)
Glucose, Bld: 133 mg/dL — ABNORMAL HIGH (ref 70–99)
Potassium: 4.3 mmol/L (ref 3.5–5.1)
Sodium: 136 mmol/L (ref 135–145)

## 2023-11-04 LAB — VITAMIN B12: Vitamin B-12: 540 pg/mL (ref 180–914)

## 2023-11-04 LAB — PHOSPHORUS: Phosphorus: 5.2 mg/dL — ABNORMAL HIGH (ref 2.5–4.6)

## 2023-11-04 LAB — MAGNESIUM: Magnesium: 2.3 mg/dL (ref 1.7–2.4)

## 2023-11-04 LAB — AMMONIA: Ammonia: <10 umol/L (ref 9–35)

## 2023-11-04 MED ORDER — BUPROPION HCL ER (XL) 150 MG PO TB24
300.0000 mg | ORAL_TABLET | Freq: Every day | ORAL | Status: DC
  Administered 2023-11-04 – 2023-11-07 (×4): 300 mg via ORAL
  Filled 2023-11-04 (×4): qty 2

## 2023-11-04 MED ORDER — CLONAZEPAM 0.5 MG PO TABS
0.5000 mg | ORAL_TABLET | Freq: Two times a day (BID) | ORAL | Status: DC
  Administered 2023-11-04 – 2023-11-07 (×7): 0.5 mg via ORAL
  Filled 2023-11-04 (×7): qty 1

## 2023-11-04 MED ORDER — ACETAMINOPHEN 650 MG RE SUPP: 650.0000 mg | Freq: Four times a day (QID) | RECTAL | Status: DC | PRN

## 2023-11-04 MED ORDER — SODIUM CHLORIDE 0.9% FLUSH
3.0000 mL | Freq: Two times a day (BID) | INTRAVENOUS | Status: DC
  Administered 2023-11-04 – 2023-11-07 (×7): 3 mL via INTRAVENOUS

## 2023-11-04 MED ORDER — SODIUM CHLORIDE 0.9 % IV SOLN: INTRAVENOUS | Status: AC

## 2023-11-04 MED ORDER — PERFLUTREN LIPID MICROSPHERE
1.0000 mL | INTRAVENOUS | Status: AC | PRN
  Administered 2023-11-04: 1 mL via INTRAVENOUS

## 2023-11-04 MED ORDER — PANTOPRAZOLE SODIUM 40 MG PO TBEC
40.0000 mg | DELAYED_RELEASE_TABLET | Freq: Two times a day (BID) | ORAL | Status: DC
  Administered 2023-11-04 – 2023-11-07 (×7): 40 mg via ORAL
  Filled 2023-11-04 (×7): qty 1

## 2023-11-04 MED ORDER — DULOXETINE HCL 60 MG PO CPEP
60.0000 mg | ORAL_CAPSULE | Freq: Every day | ORAL | Status: DC
  Administered 2023-11-04 – 2023-11-07 (×4): 60 mg via ORAL
  Filled 2023-11-04 (×3): qty 1
  Filled 2023-11-04: qty 2

## 2023-11-04 MED ORDER — ACETAMINOPHEN 325 MG PO TABS
650.0000 mg | ORAL_TABLET | Freq: Four times a day (QID) | ORAL | Status: DC | PRN
Start: 2023-11-04 — End: 2023-11-07
  Administered 2023-11-06: 650 mg via ORAL
  Filled 2023-11-04 (×2): qty 2

## 2023-11-04 MED ORDER — ALBUTEROL SULFATE (2.5 MG/3ML) 0.083% IN NEBU: 2.5000 mg | INHALATION_SOLUTION | Freq: Four times a day (QID) | RESPIRATORY_TRACT | Status: DC | PRN

## 2023-11-04 MED ORDER — ROSUVASTATIN CALCIUM 20 MG PO TABS
20.0000 mg | ORAL_TABLET | Freq: Every day | ORAL | Status: DC
  Administered 2023-11-04 – 2023-11-06 (×3): 20 mg via ORAL
  Filled 2023-11-04 (×3): qty 1

## 2023-11-04 MED ORDER — SENNOSIDES-DOCUSATE SODIUM 8.6-50 MG PO TABS: 1.0000 | ORAL_TABLET | Freq: Every evening | ORAL | Status: DC | PRN

## 2023-11-04 MED ORDER — APIXABAN 5 MG PO TABS
5.0000 mg | ORAL_TABLET | Freq: Two times a day (BID) | ORAL | Status: DC
  Administered 2023-11-04 – 2023-11-07 (×8): 5 mg via ORAL
  Filled 2023-11-04 (×9): qty 1

## 2023-11-04 NOTE — ED Notes (Signed)
Pt unable to stand for 3 min for orthostatic vitals.

## 2023-11-04 NOTE — Progress Notes (Signed)
 PROGRESS NOTE    Julie Rice  FMW:[id number] DOB: 1936-03-19 DOA: 11/03/2023 PCP: Merna Huxley, NP    Brief Narrative:   Julie Rice is a 87 y.o. female with past medical history significant for HTN, paroxysmal atrial fibrillation on Eliquis , chronic diastolic congestive heart failure, CAD, COPD, chronic hypoxic respiratory failure on 2.5 L nasal cannula nocturnally, rheumatoid arthritis who presented to [facility] ED on 12/30 with confusion after being found by her neighbor.  Also patient complaining of lightheadedness, visual hallucinations.  Patient reports she has been feeling generally weak and lightheaded when she stands.  Unable to state how long this has been ongoing for.  She states that the home health nurse was concerned last week due to heart rate in the 30s but no medication changes were made.  She denies headache, no chest pain, abdominal pain, no nausea/vomiting/diarrhea, no fever/chills, no urinary symptoms.  On EMS arrival, patient was noted to be orthostatic with near syncope upon arising.  Patient was transported to hospital for further evaluation and management.  In the ED, temperature 97.7 F, HR 53, RR 17, BP 115/56, SpO2 99% on 3 L nasal cannula.  WBC 13.2, hemoglobin 11.3, platelet count 278.  Sodium 138, potassium 4.9, chloride 104, CO2 23, glucose 121, BUN 40, creat 1.93.  AST 36, ALT 19, total bilirubin 1.0.  Ammonia level less than 10.  High-sensitivity troponin 12 followed by 13.  Urinalysis with large leukocytes, negative nitrite, few bacteria, greater than 50 WBCs.  Chest x-ray with no acute intrathoracic process.  CT head without contrast with no acute intracranial normality, but notable for chronic small vessel disease.  Patient was given 500 mL NS bolus in the ED.  TRH consulted for admission for further evaluation and management of near syncope likely secondary to orthostatic hypotension.  Assessment & Plan:    Acute metabolic encephalopathy: Resolved Patient  was poorly responsive prior to arrival in the ED with associated visual hallucinations and disorientation's.  CT head without contrast with no acute findings.  Ammonia level, TSH within normal limits.  COVID/flu/RSV PCR negative.  Vitamin B12 level within normal limits.  Creatinine elevated 1.93 with baseline 0.85.  Suspect etiology of her presenting symptoms related to volume depletion/dehydration. -- Continue treatment as below  Orthostatic hypotension Near syncope Patient presenting to the ED poorly responsive with near syncopal episode upon EMS arrival.  Patient was reported to have orthostasis after obtaining vitals Julie Rice at the scene.  Complicated by her use of antihypertensives to include losartan , spironolactone , metoprolol , amiodarone , Lasix .  Also home health RN concerned about bradycardia and no medication changes were made at that time. -- Continue IVF hydration with NS at 75 mL/h -- Holding home losartan , spironolactone , metoprolol , amiodarone , Lasix  -- TTE: Pending -- PT/OT evaluation: Pending -- Monitor on telemetry -- Orthostatic vital signs daily -- Anticipate likely need of extensive changes to her antihypertensive regimen  Acute renal failure Patient presenting with a creatinine of 1.93 with baseline 0.85 on 06/06/2023.  Etiology likely complicated by her use of losartan , spironolactone , and high-dose furosemide . -- Cr 1.93>1.87 -- Continue IVF with NS at 75 mL/h -- Holding home spironolactone , furosemide , losartan  -- Avoid nephrotoxins, renally dose all medications -- BMP daily  Essential hypertension Paroxysmal atrial fibrillation on anticoagulation Chronic diastolic congestive heart failure Recent visit by home health RN concerned about bradycardia, no medication changes made.  Currently on metoprolol  50 mg p.o. daily, amiodarone  2 mg p.o. daily, losartan  50 mg p.o. daily, spironolactone  25 mg p.o.  daily, furosemide  40 mg and 80 mg interchanged every other day. --  Holding home metoprolol , amiodarone , losartan , spironolactone , furosemide  for now -- Eliquis  5 mg p.o. twice daily -- IV fluid hydration as above -- Continue monitor on telemetry, close blood pressure monitoring -- Anticipate likely need of extensive changes to her antihypertensive regimen  Chronic hypoxic respiratory failure requiring nocturnal O2 COPD Patient is utilizing 2.5 L per nasal cannula nocturnally at baseline. -- Albuterol  neb every 6 hours as needed wheezing/shortness of breath -- Supplemental oxygen , maintain SpO2 greater than 88%  Anxiety -- Clonazepam  0.5mg  PO BID -- Wllbutrin 300 mg PO daily   HLD -- Crestor  20 g p.o. daily  Weakness/debility/deconditioning: -- PT/OT evaluation: Pending  DVT prophylaxis:  apixaban  (ELIQUIS ) tablet 5 mg    Code Status: Limited: Do not attempt resuscitation (DNR) -DNR-LIMITED -Do Not Intubate/DNI  Family Communication: Updated sister-in-law present at bedside this morning  Disposition Plan:  Level of care: Telemetry Cardiac Status is: Inpatient Remains inpatient appropriate because: Pending TTE, IV fluid hydration, anticipate likely need of extensive changes to her antibiotic extensive regimen given bradycardia, orthostasis leading to near syncope    Consultants:  None  Procedures:  TTE: Pending  Antimicrobials:  None   Subjective: Patient seen examined bedside, resting comfortable.  Lying in bed.  Remains in ED holding area.  Sister-in-law present at bedside.  Overall feels much improved since yesterday.  Renal function slowly improving.  Still unable to stand for 3 months for orthostatic vital signs this morning.  Awaiting echocardiogram and therapy evaluation.  Discussed with patient likely anticipate need to make changes to her medications as likely contributing factor to her presentation.  No other specific questions, concerns or complaints at this time.  Denies headache, no current dizziness, no chest pain, no  palpitations, no shortness of breath, no abdominal pain, no fever/chills/night sweats, no nausea/vomiting/diarrhea, no focal weakness, no fatigue, no paresthesias.  No acute events overnight per nurse staff.  Objective: Vitals:   11/04/23 0815 11/04/23 0830 11/04/23 0845 11/04/23 0900  BP: 116/64 (!) 115/55 120/78 (!) 116/52  Pulse: 62 (!) 51 (!) 55 (!) 53  Resp: 19 20 17 13   Temp:      TempSrc:      SpO2: 100% 97% 100% 100%  Weight:      Height:        Intake/Output Summary (Last 24 hours) at 11/04/2023 1037 Last data filed at 11/04/2023 0151 Gross per 24 hour  Intake 3 ml  Output --  Net 3 ml   Filed Weights   11/03/23 2046  Weight: 65.8 kg    Examination:  Physical Exam: GEN: NAD, alert and oriented x 3, elderly in appearance HEENT: NCAT, PERRL, EOMI, sclera clear, MMM PULM: CTAB w/o wheezes/crackles, normal respiratory effort, 2 L nasal cannula (baseline 2.5L nocturnally) CV: Bradycardic, regular rhythm w/o M/G/R GI: abd soft, NTND, NABS, no R/G/M MSK: no peripheral edema, muscle strength globally intact 5/5 bilateral upper/lower extremities NEURO: CN II-XII intact, no focal deficits, sensation to light touch intact PSYCH: normal mood/affect Integumentary: dry/intact, no rashes or wounds    Data Reviewed: I have personally reviewed following labs and imaging studies  CBC: Recent Labs  Lab 11/03/23 2136 11/04/23 0155  WBC 13.2* 12.6*  NEUTROABS 11.1*  --   HGB 11.3* 10.6*  HCT 35.5* 33.7*  MCV 101.1* 102.4*  PLT 278 245   Basic Metabolic Panel: Recent Labs  Lab 11/03/23 2230 11/04/23 0155  NA 138 136  K 4.9  4.3  CL 104 105  CO2 23 23  GLUCOSE 121* 133*  BUN 40* 40*  CREATININE 1.93* 1.87*  CALCIUM  8.6* 8.5*  MG  --  2.3  PHOS  --  5.2*   GFR: Estimated Creatinine Clearance: 20.1 mL/min (A) (by C-G formula based on SCr of 1.87 mg/dL (H)). Liver Function Tests: Recent Labs  Lab 11/03/23 2230  AST 36  ALT 19  ALKPHOS 60  BILITOT 1.0   PROT 6.2*  ALBUMIN 3.3*   No results for input(s): LIPASE, AMYLASE in the last 168 hours. Recent Labs  Lab 11/04/23 0155  AMMONIA <10   Coagulation Profile: No results for input(s): INR, PROTIME in the last 168 hours. Cardiac Enzymes: No results for input(s): CKTOTAL, CKMB, CKMBINDEX, TROPONINI in the last 168 hours. BNP (last 3 results) Recent Labs    02/21/23 1412 03/10/23 1234 03/21/23 1336  PROBNP 2,543* 2,500* 1,817*   HbA1C: No results for input(s): HGBA1C in the last 72 hours. CBG: Recent Labs  Lab 11/03/23 2138  GLUCAP 122*   Lipid Profile: No results for input(s): CHOL, HDL, LDLCALC, TRIG, CHOLHDL, LDLDIRECT in the last 72 hours. Thyroid  Function Tests: Recent Labs    11/04/23 0155  TSH 4.336   Anemia Panel: Recent Labs    11/04/23 0155  VITAMINB12 540   Sepsis Labs: No results for input(s): PROCALCITON, LATICACIDVEN in the last 168 hours.  Recent Results (from the past 240 hours)  Resp panel by RT-PCR (RSV, Flu A&B, Covid) Anterior Nasal Swab     Status: None   Collection Time: 11/03/23 11:37 PM   Specimen: Anterior Nasal Swab  Result Value Ref Range Status   SARS Coronavirus 2 by RT PCR NEGATIVE NEGATIVE Final   Influenza A by PCR NEGATIVE NEGATIVE Final   Influenza B by PCR NEGATIVE NEGATIVE Final    Comment: (NOTE) The Xpert Xpress SARS-CoV-2/FLU/RSV plus assay is intended as an aid in the diagnosis of influenza from Nasopharyngeal swab specimens and should not be used as a sole basis for treatment. Nasal washings and aspirates are unacceptable for Xpert Xpress SARS-CoV-2/FLU/RSV testing.  Fact Sheet for Patients: bloggercourse.com  Fact Sheet for Healthcare Providers: seriousbroker.it  This test is not yet approved or cleared by the [place]  FDA and has been authorized for detection and/or diagnosis of SARS-CoV-2 by FDA under an Emergency Use  Authorization (EUA). This EUA will remain in effect (meaning this test can be used) for the duration of the COVID-19 declaration under Section 564(b)(1) of the Act, 21 U.S.C. section 360bbb-3(b)(1), unless the authorization is terminated or revoked.     Resp Syncytial Virus by PCR NEGATIVE NEGATIVE Final    Comment: (NOTE) Fact Sheet for Patients: bloggercourse.com  Fact Sheet for Healthcare Providers: seriousbroker.it  This test is not yet approved or cleared by the [place]  FDA and has been authorized for detection and/or diagnosis of SARS-CoV-2 by FDA under an Emergency Use Authorization (EUA). This EUA will remain in effect (meaning this test can be used) for the duration of the COVID-19 declaration under Section 564(b)(1) of the Act, 21 U.S.C. section 360bbb-3(b)(1), unless the authorization is terminated or revoked.  Performed at [facility], [address], [place], [place] [address]          Radiology Studies: CT Head Wo Contrast Result Date: 11/04/2023 CLINICAL DATA:  Altered mental status EXAM: CT HEAD WITHOUT CONTRAST TECHNIQUE: Contiguous axial images were obtained from the base of the skull through the vertex without intravenous  contrast. RADIATION DOSE REDUCTION: This exam was performed according to the departmental dose-optimization program which includes automated exposure control, adjustment of the mA and/or kV according to patient size and/or use of iterative reconstruction technique. COMPARISON:  06/01/2023 FINDINGS: Brain: There is no mass, hemorrhage or extra-axial collection. The size and configuration of the ventricles and extra-axial CSF spaces are normal. There is hypoattenuation of the white matter, most commonly indicating chronic small vessel disease. Vascular: No hyperdense vessel or unexpected vascular calcification. Skull: The visualized skull base, calvarium and extracranial soft tissues  are normal. Sinuses/Orbits: No fluid levels or advanced mucosal thickening of the visualized paranasal sinuses. No mastoid or middle ear effusion. Normal orbits. Other: None. IMPRESSION: 1. No acute intracranial abnormality. 2. Chronic small vessel disease. Electronically Signed   By: Franky Stanford M.D.   On: 11/04/2023 01:15   DG Chest Port 1 View Result Date: 11/03/2023 CLINICAL DATA:  Syncope EXAM: PORTABLE CHEST 1 VIEW COMPARISON:  06/01/2023 FINDINGS: Single frontal view of the chest demonstrates a stable cardiac silhouette. No airspace disease, effusion, or pneumothorax. No acute bony abnormalities. IMPRESSION: 1. No acute intrathoracic process. Electronically Signed   By: Ozell Daring M.D.   On: 11/03/2023 22:32        Scheduled Meds:  apixaban   5 mg Oral BID   clonazePAM   0.5 mg Oral BID   DULoxetine   60 mg Oral Daily   pantoprazole   40 mg Oral BID   rosuvastatin   20 mg Oral q1800   sodium chloride  flush  3 mL Intravenous Q12H   Continuous Infusions:  sodium chloride  75 mL/hr at 11/04/23 0933     LOS: 0 days    Time spent: 56 Minutes spent on chart review, discussion with nursing staff, consultants, updating family and interview/physical exam; more than 50% of that time was spent in counseling and/or coordination of care.    Julie Rice Eylin Pontarelli, DO [facility] Available via Epic secure chat 7am-7pm After these hours, please refer to coverage provider listed on [email or web address] 11/04/2023, 10:37 AM

## 2023-11-04 NOTE — Evaluation (Signed)
 Occupational Therapy Evaluation Patient Details Name: Julie Rice MRN: [id number] DOB: August 28, 1936 Today's Date: 11/04/2023   History of Present Illness 87 y.o. female with medical history significant for hypertension, CAD, PAF on Eliquis , chronic HFpEF, COPD, chronic hypoxic respiratory failure, and rheumatoid arthritis adm 12/30 with lightheadedness, decreased LOC, and visual hallucinations. Acute encephalopathy, no acute findings noted on head CT.   Clinical Impression   Patient admitted for the diagnosis above.  PTA she lives alone, has groceries delivered, has a neighbor that checks on her daily, walks household distances with 4WR[employer], sponge bathes in the shower.  More oriented this date, needing Min A for simple transfers and lower body ADL.  Baseline tremors.  Patient with recent SNF stay and completed HH OT and PT.  OT will follow in the acute setting, patient wants to try for home with [facility] if needed.  PT consult pending.  Patient agreeable to SNF if needed.         If plan is discharge home, recommend the following: Assist for transportation;Supervision due to cognitive status    Functional Status Assessment  Patient has had a recent decline in their functional status and demonstrates the ability to make significant improvements in function in a reasonable and predictable amount of time.  Equipment Recommendations  None recommended by OT    Recommendations for Other Services       Precautions / Restrictions Precautions Precautions: Fall Precaution Comments: Incontinent of urine Restrictions Weight Bearing Restrictions Per Provider Order: No      Mobility Bed Mobility Overal bed mobility: Needs Assistance Bed Mobility: Supine to Sit     Supine to sit: Supervision       Patient Response: Cooperative  Transfers Overall transfer level: Needs assistance Equipment used: Rolling walker (2 wheels) Transfers: Sit to/from Stand, Bed to chair/wheelchair/BSC Sit to  Stand: Min assist     Step pivot transfers: Contact guard assist            Balance Overall balance assessment: Needs assistance Sitting-balance support: Feet supported Sitting balance-Leahy Scale: Good     Standing balance support: Reliant on assistive device for balance Standing balance-Leahy Scale: Poor                             ADL either performed or assessed with clinical judgement   ADL       Grooming: Wash/dry hands;Wash/dry face;Set up;Sitting               Lower Body Dressing: Minimal assistance;Sit to/from stand   Toilet Transfer: Contact guard assist;BSC/3in1;Stand-pivot;Rolling walker (2 wheels)                   Vision Baseline Vision/History: 1 Wears glasses Patient Visual Report: No change from baseline       Perception Perception: Within Functional Limits       Praxis Praxis: WFL       Pertinent Vitals/Pain Pain Assessment Pain Assessment: No/denies pain     Extremity/Trunk Assessment Upper Extremity Assessment Upper Extremity Assessment: Overall WFL for tasks assessed;RUE deficits/detail;LUE deficits/detail RUE Deficits / Details: baseline tremors LUE Deficits / Details: Baseline tremors   Lower Extremity Assessment Lower Extremity Assessment: Defer to PT evaluation   Cervical / Trunk Assessment Cervical / Trunk Assessment: Kyphotic   Communication Communication Communication: No apparent difficulties   Cognition Arousal: Alert Behavior During Therapy: WFL for tasks assessed/performed Overall Cognitive Status: Impaired/Different from baseline Area of Impairment:  Memory, Safety/judgement, Problem solving                     Memory: Decreased short-term memory   Safety/Judgement: Decreased awareness of safety   Problem Solving: Difficulty sequencing       General Comments   VSS on O2    Exercises     Shoulder Instructions      Home Living Family/patient expects to be discharged to::  Private residence Living Arrangements: Alone Available Help at Discharge: Family;Available PRN/intermittently Type of Home: House (Condo) Home Access: Stairs to enter [employer] of Steps: 1 Entrance Stairs-Rails: Left Home Layout: One level     Bathroom Shower/Tub: Chief Strategy Officer: Handicapped height Bathroom Accessibility: Yes   Home Equipment: Tub bench;Cane - quad;Grab bars - tub/shower;Toilet riser;Rollator (4 wheels)   Additional Comments: wears O2 at night, 2.5L      Prior Functioning/Environment Prior Level of Function : Independent/Modified Independent             Mobility Comments: Uses rollator for mobility ADLs Comments: Sponge bathes in the shower, letting water run at the bottom and not the shower head, has groceries delivered.  Meals are promarily microwaveable.  Manages her own meds.  Next door neighbor checks on her daily.        OT Problem List: Decreased activity tolerance;Impaired balance (sitting and/or standing);Decreased safety awareness      OT Treatment/Interventions: Self-care/ADL training;Therapeutic activities;Energy conservation;Patient/family education;Balance training    OT Goals(Current goals can be found in the care plan section) Acute Rehab OT Goals Patient Stated Goal: Return home OT Goal Formulation: With patient Time For Goal Achievement: 11/18/23 Potential to Achieve Goals: Good ADL Goals Pt Will Perform Grooming: with modified independence;standing Pt Will Perform Lower Body Dressing: with modified independence;sit to/from stand Pt Will Transfer to Toilet: with modified independence;regular height toilet;ambulating  OT Frequency: Min 1X/week    Co-evaluation              AM-PAC OT 6 Clicks Daily Activity     Outcome Measure Help from another person eating meals?: None Help from another person taking care of personal grooming?: None Help from another person toileting, which includes using  toliet, bedpan, or urinal?: A Little Help from another person bathing (including washing, rinsing, drying)?: A Little Help from another person to put on and taking off regular upper body clothing?: None Help from another person to put on and taking off regular lower body clothing?: A Little 6 Click Score: 21   End of Session Equipment Utilized During Treatment: Rolling walker (2 wheels);Oxygen  Nurse Communication: Mobility status  Activity Tolerance: Patient tolerated treatment well Patient left: in chair;with call bell/phone within reach;with chair alarm set  OT Visit Diagnosis: Unsteadiness on feet (R26.81);Muscle weakness (generalized) (M62.81);Other symptoms and signs involving cognitive function                Time: 8449-8387 OT Time Calculation (min): 22 min Charges:  OT General Charges $OT Visit: 1 Visit OT Evaluation $OT Eval Moderate Complexity: 1 Mod  11/04/2023  RP, OTR/L  Acute Rehabilitation Services  Office:  [phone]   Julie Rice 11/04/2023, 4:23 PM

## 2023-11-05 DIAGNOSIS — N179 Acute kidney failure, unspecified: Secondary | ICD-10-CM | POA: Diagnosis not present

## 2023-11-05 LAB — BASIC METABOLIC PANEL
Anion gap: 9 (ref 5–15)
BUN: 35 mg/dL — ABNORMAL HIGH (ref 8–23)
CO2: 24 mmol/L (ref 22–32)
Calcium: 9 mg/dL (ref 8.9–10.3)
Chloride: 102 mmol/L (ref 98–111)
Creatinine, Ser: 1.47 mg/dL — ABNORMAL HIGH (ref 0.44–1.00)
GFR, Estimated: 35 mL/min — ABNORMAL LOW (ref >60)
Glucose, Bld: 106 mg/dL — ABNORMAL HIGH (ref 70–99)
Potassium: 4 mmol/L (ref 3.5–5.1)
Sodium: 135 mmol/L (ref 135–145)

## 2023-11-05 LAB — CBC
HCT: 33.7 % — ABNORMAL LOW (ref 36.0–46.0)
Hemoglobin: 10.7 g/dL — ABNORMAL LOW (ref 12.0–15.0)
MCH: 31.9 pg (ref 26.0–34.0)
MCHC: 31.8 g/dL (ref 30.0–36.0)
MCV: 100.6 fL — ABNORMAL HIGH (ref 80.0–100.0)
Platelets: 265 10*3/uL (ref 150–400)
RBC: 3.35 MIL/uL — ABNORMAL LOW (ref 3.87–5.11)
RDW: 15 % (ref 11.5–15.5)
WBC: 12.4 10*3/uL — ABNORMAL HIGH (ref 4.0–10.5)
nRBC: 0 % (ref 0.0–0.2)

## 2023-11-05 LAB — MAGNESIUM: Magnesium: 2.5 mg/dL — ABNORMAL HIGH (ref 1.7–2.4)

## 2023-11-05 MED ORDER — PROCHLORPERAZINE EDISYLATE 10 MG/2ML IJ SOLN
10.0000 mg | Freq: Four times a day (QID) | INTRAMUSCULAR | Status: DC | PRN
  Administered 2023-11-05: 10 mg via INTRAVENOUS
  Filled 2023-11-05: qty 2

## 2023-11-05 NOTE — Evaluation (Signed)
 Physical Therapy Evaluation Patient Details Name: Julie Rice MRN: [id number] DOB: 04/12/1936 Today's Date: 11/05/2023  History of Present Illness  88 y.o. female with medical history significant for hypertension, CAD, PAF on Eliquis , chronic HFpEF, COPD, chronic hypoxic respiratory failure, and rheumatoid arthritis adm 12/30 with lightheadedness, decreased LOC, and visual hallucinations. Acute encephalopathy, no acute findings noted on head CT. Acute AKI as well.  Clinical Impression  Pt admitted with above diagnosis and presents to PT with functional limitations due to deficits listed below (See PT problem list). Pt needs skilled PT to maximize independence and safety. Pt presents from home alone. Reports history of falls primarily when she becomes light headed. She has a life alert and she has a neighbor who checks on her daily. Pt with a recent SNF admission and wants to return to her home. Currently she appears close to her recent baseline. Pt will continue to be a fall risk no matter what setting she is in and with her current mobility not unreasonable to return home. Would recommend maximizing [facility] services including PT/OT and aide.           If plan is discharge home, recommend the following: Assist for transportation;Help with stairs or ramp for entrance   Can travel by private vehicle        Equipment Recommendations None recommended by PT  Recommendations for Other Services       Functional Status Assessment Patient has had a recent decline in their functional status and demonstrates the ability to make significant improvements in function in a reasonable and predictable amount of time.     Precautions / Restrictions Precautions Precautions: Fall Restrictions Weight Bearing Restrictions Per Provider Order: No      Mobility  Bed Mobility Overal bed mobility: Needs Assistance Bed Mobility: Supine to Sit     Supine to sit: Supervision, HOB elevated     General bed  mobility comments: No physical assist. Incr time    Transfers Overall transfer level: Needs assistance Equipment used: Rollator (4 wheels) Transfers: Sit to/from Stand Sit to Stand: Contact guard assist           General transfer comment: Assist for safety and lines    Ambulation/Gait Ambulation/Gait assistance: Contact guard assist, Supervision Gait Distance (Feet): 125 Feet Assistive device: Rollator (4 wheels) Gait Pattern/deviations: Step-through pattern, Decreased stride length, Trunk flexed Gait velocity: decr Gait velocity interpretation: <1.8 ft/sec, indicate of risk for recurrent falls   General Gait Details: Assist for safety and lines  Stairs            Wheelchair Mobility     Tilt Bed    Modified Rankin (Stroke Patients Only)       Balance Overall balance assessment: Needs assistance Sitting-balance support: No upper extremity supported, Feet supported Sitting balance-Leahy Scale: Good     Standing balance support: Single extremity supported, Bilateral upper extremity supported, Reliant on assistive device for balance Standing balance-Leahy Scale: Poor Standing balance comment: UE support for static standing                             Pertinent Vitals/Pain Pain Assessment Pain Assessment: No/denies pain    Home Living Family/patient expects to be discharged to:: Private residence Living Arrangements: Alone Available Help at Discharge: Family;Available PRN/intermittently Type of Home: House (condo) Home Access: Stairs to enter Entrance Stairs-Rails: Left Entrance Stairs-Number of Steps: 1   Home Layout: One level Home Equipment:  Tub bench;Cane - quad;Grab bars - tub/shower;Toilet riser;Rollator (4 wheels) Additional Comments: wears O2 at night, 2.5L    Prior Function Prior Level of Function : Independent/Modified Independent             Mobility Comments: Uses rollator for mobility ADLs Comments: Sponge bathes in  the shower, letting water run at the bottom and not the shower head, has groceries delivered.  Meals are promarily microwaveable.  Manages her own meds.  Next door neighbor checks on her daily.     Extremity/Trunk Assessment   Upper Extremity Assessment Upper Extremity Assessment: Defer to OT evaluation    Lower Extremity Assessment Lower Extremity Assessment: Generalized weakness    Cervical / Trunk Assessment Cervical / Trunk Assessment: Kyphotic  Communication   Communication Communication: No apparent difficulties  Cognition Arousal: Alert Behavior During Therapy: WFL for tasks assessed/performed Overall Cognitive Status: Within Functional Limits for tasks assessed                                          General Comments General comments (skin integrity, edema, etc.): VSS. Unable to obtain SpO2 reading    Exercises     Assessment/Plan    PT Assessment Patient needs continued PT services  PT Problem List Decreased strength;Decreased balance;Decreased mobility       PT Treatment Interventions DME instruction;Gait training;Functional mobility training;Therapeutic activities;Therapeutic exercise;Balance training;Patient/family education    PT Goals (Current goals can be found in the Care Plan section)  Acute Rehab PT Goals Patient Stated Goal: return home PT Goal Formulation: With patient Time For Goal Achievement: 11/19/23 Potential to Achieve Goals: Good    Frequency Min 1X/week     Co-evaluation               AM-PAC PT 6 Clicks Mobility  Outcome Measure Help needed turning from your back to your side while in a flat bed without using bedrails?: None Help needed moving from lying on your back to sitting on the side of a flat bed without using bedrails?: A Little Help needed moving to and from a bed to a chair (including a wheelchair)?: A Little Help needed standing up from a chair using your arms (e.g., wheelchair or bedside  chair)?: A Little Help needed to walk in hospital room?: A Little Help needed climbing 3-5 steps with a railing? : A Little 6 Click Score: 19    End of Session Equipment Utilized During Treatment: Gait belt Activity Tolerance: Patient tolerated treatment well Patient left: in chair;with call bell/phone within reach;with chair alarm set Nurse Communication: Mobility status PT Visit Diagnosis: Unsteadiness on feet (R26.81);Other abnormalities of gait and mobility (R26.89);Muscle weakness (generalized) (M62.81)    Time: 1030-1053 PT Time Calculation (min) (ACUTE ONLY): 23 min   Charges:   PT Evaluation $PT Eval Moderate Complexity: 1 Mod PT Treatments $Gait Training: 8-22 mins PT General Charges $$ ACUTE PT VISIT: 1 Visit         [facility] PT Acute Rehabilitation Services Office [phone]   Rodgers ORN [facility] 11/05/2023, 12:52 PM

## 2023-11-05 NOTE — Progress Notes (Signed)
 Ordered IV fluid not running on assessment. Notified on call physician. Repeat labs show improvement in AKI; physician advised to encourage oral intake rather than re-initiate IV fluid.

## 2023-11-05 NOTE — Progress Notes (Signed)
 PROGRESS NOTE    Julie Rice  FMW:[id number] DOB: 1936-09-05 DOA: 11/03/2023 PCP: Merna Huxley, NP    Brief Narrative:   Julie Rice is a 88 y.o. female with past medical history significant for HTN, paroxysmal atrial fibrillation on Eliquis , chronic diastolic congestive heart failure, CAD, COPD, chronic hypoxic respiratory failure on 2.5 L nasal cannula nocturnally, rheumatoid arthritis who presented to [facility] ED on 12/30 with confusion after being found by her neighbor.  Also patient complaining of lightheadedness, visual hallucinations.  Patient reports she has been feeling generally weak and lightheaded when she stands.  Unable to state how long this has been ongoing for.  She states that the home health nurse was concerned last week due to heart rate in the 30s but no medication changes were made.  She denies headache, no chest pain, abdominal pain, no nausea/vomiting/diarrhea, no fever/chills, no urinary symptoms.  On EMS arrival, patient was noted to be orthostatic with near syncope upon arising.  Patient was transported to hospital for further evaluation and management.  In the ED, temperature 97.7 F, HR 53, RR 17, BP 115/56, SpO2 99% on 3 L nasal cannula.  WBC 13.2, hemoglobin 11.3, platelet count 278.  Sodium 138, potassium 4.9, chloride 104, CO2 23, glucose 121, BUN 40, creat 1.93.  AST 36, ALT 19, total bilirubin 1.0.  Ammonia level less than 10.  High-sensitivity troponin 12 followed by 13.  Urinalysis with large leukocytes, negative nitrite, few bacteria, greater than 50 WBCs.  Chest x-ray with no acute intrathoracic process.  CT head without contrast with no acute intracranial normality, but notable for chronic small vessel disease.  Patient was given 500 mL NS bolus in the ED.  TRH consulted for admission for further evaluation and management of near syncope likely secondary to orthostatic hypotension.  Assessment & Plan:    Acute metabolic encephalopathy: Resolved Patient  was poorly responsive prior to arrival in the ED with associated visual hallucinations and disorientation's.  CT head without contrast with no acute findings.  Ammonia level, TSH within normal limits.  COVID/flu/RSV PCR negative.  Vitamin B12 level within normal limits.  Creatinine elevated 1.93 with baseline 0.85.  Suspect etiology of her presenting symptoms related to volume depletion/dehydration. -- Continue treatment as below  Orthostatic hypotension Near syncope Patient presenting to the ED poorly responsive with near syncopal episode upon EMS arrival.  Patient was reported to have orthostasis after obtaining vitals Joseph at the scene.  Complicated by her use of antihypertensives to include losartan , spironolactone , metoprolol , amiodarone , Lasix .  Also home health RN concerned about bradycardia and no medication changes were made at that time.  TTE with LVEF 77 5%, grade 1 diastolic dysfunction, LA moderately dilated, IVC normal. -- Holding home losartan , spironolactone , metoprolol , amiodarone , Lasix  -- TTE: Pending -- OT recommending home health -- PT evaluation: Pending -- Monitor on telemetry -- Orthostatic vital signs daily -- Anticipate likely need of extensive changes to her antihypertensive regimen  Acute renal failure Patient presenting with a creatinine of 1.93 with baseline 0.85 on 06/06/2023.  Etiology likely complicated by her use of losartan , spironolactone , and high-dose furosemide . -- Cr 1.93>1.87>1.47 -- IV fluids now discontinued, continue to encourage increased oral intake -- Holding home spironolactone , furosemide , losartan  -- Avoid nephrotoxins, renally dose all medications -- BMP daily  Essential hypertension Paroxysmal atrial fibrillation on anticoagulation Chronic diastolic congestive heart failure Recent visit by home health RN concerned about bradycardia, no medication changes made.  Currently on metoprolol  50 mg p.o. daily,  amiodarone  2 mg p.o. daily, losartan   50 mg p.o. daily, spironolactone  25 mg p.o. daily, furosemide  40 mg and 80 mg interchanged every other day. -- Holding home metoprolol , amiodarone , losartan , spironolactone , furosemide  for now -- Eliquis  5 mg p.o. twice daily -- IV fluid hydration as above -- Continue monitor on telemetry, close blood pressure monitoring -- Anticipate likely need of extensive changes to her antihypertensive regimen  Chronic hypoxic respiratory failure requiring nocturnal O2 COPD Patient is utilizing 2.5 L per nasal cannula nocturnally at baseline. -- Albuterol  neb every 6 hours as needed wheezing/shortness of breath -- Supplemental oxygen , maintain SpO2 greater than 88%  Anxiety -- Clonazepam  0.5mg  PO BID -- Wllbutrin 300 mg PO daily   HLD -- Crestor  20 g p.o. daily  Weakness/debility/deconditioning: -- PT/OT evaluation: Pending  DVT prophylaxis:  apixaban  (ELIQUIS ) tablet 5 mg    Code Status: Limited: Do not attempt resuscitation (DNR) -DNR-LIMITED -Do Not Intubate/DNI  Family Communication: Updated sister-in-law present at bedside this morning  Disposition Plan:  Level of care: Telemetry Cardiac Status is: Inpatient Remains inpatient appropriate because: Continue to monitor on telemetry, needs further monitoring of heart rate, blood pressure and likely anticipate need of discontinuation/alteration of her antihypertensives, anticipate discharge home likely tomorrow    Consultants:  None  Procedures:  TTE:   Antimicrobials:  None   Subjective: Patient seen examined bedside, resting comfortable.  Sleeping but easily arousable.  Feels shaky.  Has been told that she is has tremors over the last 2 years, initially thought to be Parkinson's but was ruled out.  Blood pressure remained stable off of all of her antihypertensives, heart rate also hovering around 60 bpm off of metoprolol  and amiodarone .  Continues with improvement of renal function, IV fluids now discontinued.  Encourage  increased oral intake.  No other specific questions, concerns or complaints at this time.  Denies headache, no current dizziness, no chest pain, no palpitations, no shortness of breath, no abdominal pain, no fever/chills/night sweats, no nausea/vomiting/diarrhea, no focal weakness, no fatigue, no paresthesias.  No acute events overnight per nursing staff.  Objective: Vitals:   11/04/23 2038 11/04/23 2323 11/05/23 0346 11/05/23 0808  BP: 116/60 (!) 137/57 (!) 118/42 (!) 142/91  Pulse: 62 62 60 66  Resp: 16 17 12 16   Temp: 98.8 F (37.1 C) 98.1 F (36.7 C) 98 F (36.7 C) 98.2 F (36.8 C)  TempSrc: Oral Oral Oral Oral  SpO2: 100% 100% 100% 96%  Weight:   68.4 kg   Height:        Intake/Output Summary (Last 24 hours) at 11/05/2023 1124 Last data filed at 11/05/2023 0700 Gross per 24 hour  Intake 243 ml  Output 402 ml  Net -159 ml   Filed Weights   11/03/23 2046 11/04/23 1516 11/05/23 0346  Weight: 65.8 kg 67.8 kg 68.4 kg    Examination:  Physical Exam: GEN: NAD, alert and oriented x 3, elderly in appearance HEENT: NCAT, PERRL, EOMI, sclera clear, MMM PULM: CTAB w/o wheezes/crackles, normal respiratory effort, 2 L nasal cannula (baseline 2.5L nocturnally) CV: Bradycardic, regular rhythm w/o M/G/R GI: abd soft, NTND, NABS, no R/G/M MSK: no peripheral edema, muscle strength globally intact 5/5 bilateral upper/lower extremities NEURO: CN II-XII intact, no focal deficits, sensation to light touch intact PSYCH: normal mood/affect Integumentary: dry/intact, no rashes or wounds    Data Reviewed: I have personally reviewed following labs and imaging studies  CBC: Recent Labs  Lab 11/03/23 2136 11/04/23 0155 11/05/23 0338  WBC 13.2*  12.6* 12.4*  NEUTROABS 11.1*  --   --   HGB 11.3* 10.6* 10.7*  HCT 35.5* 33.7* 33.7*  MCV 101.1* 102.4* 100.6*  PLT 278 245 265   Basic Metabolic Panel: Recent Labs  Lab 11/03/23 2230 11/04/23 0155 11/05/23 0338  NA 138 136 135  K 4.9 4.3  4.0  CL 104 105 102  CO2 23 23 24   GLUCOSE 121* 133* 106*  BUN 40* 40* 35*  CREATININE 1.93* 1.87* 1.47*  CALCIUM  8.6* 8.5* 9.0  MG  --  2.3 2.5*  PHOS  --  5.2*  --    GFR: Estimated Creatinine Clearance: 26 mL/min (A) (by C-G formula based on SCr of 1.47 mg/dL (H)). Liver Function Tests: Recent Labs  Lab 11/03/23 2230  AST 36  ALT 19  ALKPHOS 60  BILITOT 1.0  PROT 6.2*  ALBUMIN 3.3*   No results for input(s): LIPASE, AMYLASE in the last 168 hours. Recent Labs  Lab 11/04/23 0155  AMMONIA <10   Coagulation Profile: No results for input(s): INR, PROTIME in the last 168 hours. Cardiac Enzymes: No results for input(s): CKTOTAL, CKMB, CKMBINDEX, TROPONINI in the last 168 hours. BNP (last 3 results) Recent Labs    02/21/23 1412 03/10/23 1234 03/21/23 1336  PROBNP 2,543* 2,500* 1,817*   HbA1C: No results for input(s): HGBA1C in the last 72 hours. CBG: Recent Labs  Lab 11/03/23 2138  GLUCAP 122*   Lipid Profile: No results for input(s): CHOL, HDL, LDLCALC, TRIG, CHOLHDL, LDLDIRECT in the last 72 hours. Thyroid  Function Tests: Recent Labs    11/04/23 0155  TSH 4.336   Anemia Panel: Recent Labs    11/04/23 0155  VITAMINB12 540   Sepsis Labs: No results for input(s): PROCALCITON, LATICACIDVEN in the last 168 hours.  Recent Results (from the past 240 hours)  Resp panel by RT-PCR (RSV, Flu A&B, Covid) Anterior Nasal Swab     Status: None   Collection Time: 11/03/23 11:37 PM   Specimen: Anterior Nasal Swab  Result Value Ref Range Status   SARS Coronavirus 2 by RT PCR NEGATIVE NEGATIVE Final   Influenza A by PCR NEGATIVE NEGATIVE Final   Influenza B by PCR NEGATIVE NEGATIVE Final    Comment: (NOTE) The Xpert Xpress SARS-CoV-2/FLU/RSV plus assay is intended as an aid in the diagnosis of influenza from Nasopharyngeal swab specimens and should not be used as a sole basis for treatment. Nasal washings and aspirates are  unacceptable for Xpert Xpress SARS-CoV-2/FLU/RSV testing.  Fact Sheet for Patients: bloggercourse.com  Fact Sheet for Healthcare Providers: seriousbroker.it  This test is not yet approved or cleared by the [place]  FDA and has been authorized for detection and/or diagnosis of SARS-CoV-2 by FDA under an Emergency Use Authorization (EUA). This EUA will remain in effect (meaning this test can be used) for the duration of the COVID-19 declaration under Section 564(b)(1) of the Act, 21 U.S.C. section 360bbb-3(b)(1), unless the authorization is terminated or revoked.     Resp Syncytial Virus by PCR NEGATIVE NEGATIVE Final    Comment: (NOTE) Fact Sheet for Patients: bloggercourse.com  Fact Sheet for Healthcare Providers: seriousbroker.it  This test is not yet approved or cleared by the [place]  FDA and has been authorized for detection and/or diagnosis of SARS-CoV-2 by FDA under an Emergency Use Authorization (EUA). This EUA will remain in effect (meaning this test can be used) for the duration of the COVID-19 declaration under Section 564(b)(1) of the Act, 21 U.S.C. section 360bbb-3(b)(1), unless the  authorization is terminated or revoked.  Performed at [facility], [address], [place], [place] [address]          Radiology Studies: ECHOCARDIOGRAM COMPLETE Result Date: 11/04/2023    ECHOCARDIOGRAM REPORT   Patient Name:   GEANNA DIVIRGILIO Rettke Date of Exam: 11/04/2023 Medical Rec #:  [id number]        Height:       63.0 in Accession #:    [id number]       Weight:       145.0 lb Date of Birth:  1936/07/20         BSA:          1.687 m Patient Age:    87 years         BP:           103/55 mmHg Patient Gender: F                HR:           60 bpm. Exam Location:  Inpatient Procedure: 2D Echo, Cardiac Doppler, Color Doppler and Intracardiac            Opacification Agent  Indications:    R55 Syncope  History:        Patient has prior history of Echocardiogram examinations, most                 recent 06/02/2023. CAD, TIA, Arrythmias:Atrial Fibrillation; Risk                 Factors:Hypertension.  Sonographer:    Lanell Maduro Referring Phys: [id number] Derrian Poli J Sascha Palma IMPRESSIONS  1. Left ventricular ejection fraction, by estimation, is 70 to 75%. Left ventricular ejection fraction by 2D MOD biplane is 71.6 %. The left ventricle has hyperdynamic function. The left ventricle has no regional wall motion abnormalities. There is moderate concentric left ventricular hypertrophy. Left ventricular diastolic parameters are consistent with Grade I diastolic dysfunction (impaired relaxation).  2. Right ventricular systolic function is normal. The right ventricular size is normal. There is moderately elevated pulmonary artery systolic pressure. The estimated right ventricular systolic pressure is 46.2 mmHg.  3. Left atrial size was mildly dilated.  4. The mitral valve is grossly normal. Trivial mitral valve regurgitation. Moderate mitral annular calcification.  5. The aortic valve is tricuspid. Aortic valve regurgitation is trivial.  6. The inferior vena cava is normal in size with <50% respiratory variability, suggesting right atrial pressure of 8 mmHg. FINDINGS  Left Ventricle: Left ventricular ejection fraction, by estimation, is 70 to 75%. Left ventricular ejection fraction by 2D MOD biplane is 71.6 %. The left ventricle has hyperdynamic function. The left ventricle has no regional wall motion abnormalities. The left ventricular internal cavity size was normal in size. There is moderate concentric left ventricular hypertrophy. Left ventricular diastolic parameters are consistent with Grade I diastolic dysfunction (impaired relaxation). Right Ventricle: The right ventricular size is normal. No increase in right ventricular wall thickness. Right ventricular systolic function is normal. There is  moderately elevated pulmonary artery systolic pressure. The tricuspid regurgitant velocity is 3.09 m/s, and with an assumed right atrial pressure of 8 mmHg, the estimated right ventricular systolic pressure is 46.2 mmHg. Left Atrium: Left atrial size was mildly dilated. Right Atrium: Right atrial size was normal in size. Pericardium: Trivial pericardial effusion is present. Mitral Valve: The mitral valve is grossly normal. Moderate mitral annular calcification. Trivial mitral valve regurgitation. Tricuspid Valve: The tricuspid valve is  normal in structure. Tricuspid valve regurgitation is mild. Aortic Valve: The aortic valve is tricuspid. Aortic valve regurgitation is trivial. Pulmonic Valve: The pulmonic valve was normal in structure. Pulmonic valve regurgitation is trivial. Aorta: The aortic root and ascending aorta are structurally normal, with no evidence of dilitation. Venous: The inferior vena cava is normal in size with less than 50% respiratory variability, suggesting right atrial pressure of 8 mmHg. IAS/Shunts: The atrial septum is grossly normal.  LEFT VENTRICLE PLAX 2D                        Biplane EF (MOD) LVIDd:         3.60 cm         LV Biplane EF:   Left LVIDs:         2.40 cm                          ventricular LV PW:         1.30 cm                          ejection LV IVS:        1.40 cm                          fraction by LVOT diam:     2.00 cm                          2D MOD LV SV:         91                               biplane is LV SV Index:   54                               71.6 %. LVOT Area:     3.14 cm                                Diastology                                LV e' medial:    3.05 cm/s LV Volumes (MOD)               LV E/e' medial:  25.7 LV vol d, MOD    56.0 ml       LV e' lateral:   3.81 cm/s A2C:                           LV E/e' lateral: 20.6 LV vol d, MOD    69.6 ml A4C: LV vol s, MOD    16.9 ml A2C: LV vol s, MOD    19.2 ml A4C: LV SV MOD A2C:   39.1 ml LV SV MOD  A4C:   69.6 ml LV SV MOD BP:    45.6 ml RIGHT VENTRICLE RV Basal diam:  4.00 cm RV Mid diam:    3.10 cm RV S prime:     19.80 cm/s TAPSE (M-mode):  3.4 cm LEFT ATRIUM             Index        RIGHT ATRIUM           Index LA diam:        3.60 cm 2.13 cm/m   RA Area:     16.30 cm LA Vol (A2C):   61.0 ml 36.17 ml/m  RA Volume:   33.20 ml  19.68 ml/m LA Vol (A4C):   55.2 ml 32.73 ml/m LA Biplane Vol: 58.6 ml 34.74 ml/m  AORTIC VALVE LVOT Vmax:   135.00 cm/s LVOT Vmean:  96.000 cm/s LVOT VTI:    0.289 m  AORTA Ao Root diam: 2.80 cm Ao Asc diam:  3.30 cm MITRAL VALVE                TRICUSPID VALVE MV Area (PHT): 2.42 cm     TR Peak grad:   38.2 mmHg MV Decel Time: 314 msec     TR Vmax:        309.00 cm/s MV E velocity: 78.30 cm/s MV A velocity: 103.00 cm/s  SHUNTS MV E/A ratio:  0.76         Systemic VTI:  0.29 m                             Systemic Diam: 2.00 cm Aditya Sabharwal Electronically signed by Ria Commander Signature Date/Time: 11/04/2023/4:15:24 PM    Final    CT Head Wo Contrast Result Date: 11/04/2023 CLINICAL DATA:  Altered mental status EXAM: CT HEAD WITHOUT CONTRAST TECHNIQUE: Contiguous axial images were obtained from the base of the skull through the vertex without intravenous contrast. RADIATION DOSE REDUCTION: This exam was performed according to the departmental dose-optimization program which includes automated exposure control, adjustment of the mA and/or kV according to patient size and/or use of iterative reconstruction technique. COMPARISON:  06/01/2023 FINDINGS: Brain: There is no mass, hemorrhage or extra-axial collection. The size and configuration of the ventricles and extra-axial CSF spaces are normal. There is hypoattenuation of the white matter, most commonly indicating chronic small vessel disease. Vascular: No hyperdense vessel or unexpected vascular calcification. Skull: The visualized skull base, calvarium and extracranial soft tissues are normal. Sinuses/Orbits: No fluid  levels or advanced mucosal thickening of the visualized paranasal sinuses. No mastoid or middle ear effusion. Normal orbits. Other: None. IMPRESSION: 1. No acute intracranial abnormality. 2. Chronic small vessel disease. Electronically Signed   By: Franky Stanford M.D.   On: 11/04/2023 01:15   DG Chest Port 1 View Result Date: 11/03/2023 CLINICAL DATA:  Syncope EXAM: PORTABLE CHEST 1 VIEW COMPARISON:  06/01/2023 FINDINGS: Single frontal view of the chest demonstrates a stable cardiac silhouette. No airspace disease, effusion, or pneumothorax. No acute bony abnormalities. IMPRESSION: 1. No acute intrathoracic process. Electronically Signed   By: Ozell Daring M.D.   On: 11/03/2023 22:32        Scheduled Meds:  apixaban   5 mg Oral BID   buPROPion   300 mg Oral Daily   clonazePAM   0.5 mg Oral BID   DULoxetine   60 mg Oral Daily   pantoprazole   40 mg Oral BID   rosuvastatin   20 mg Oral q1800   sodium chloride  flush  3 mL Intravenous Q12H   Continuous Infusions:     LOS: 1 day    Time spent: 56 Minutes spent on chart review, discussion with nursing staff, consultants,  updating family and interview/physical exam; more than 50% of that time was spent in counseling and/or coordination of care.    Camellia PARAS Mikhai Bienvenue, DO [facility] Available via Epic secure chat 7am-7pm After these hours, please refer to coverage provider listed on [email or web address] 11/05/2023, 11:24 AM

## 2023-11-06 DIAGNOSIS — N179 Acute kidney failure, unspecified: Secondary | ICD-10-CM | POA: Diagnosis not present

## 2023-11-06 LAB — CBC
HCT: 29.6 % — ABNORMAL LOW (ref 36.0–46.0)
Hemoglobin: 9.5 g/dL — ABNORMAL LOW (ref 12.0–15.0)
MCH: 32.1 pg (ref 26.0–34.0)
MCHC: 32.1 g/dL (ref 30.0–36.0)
MCV: 100 fL (ref 80.0–100.0)
Platelets: 212 10*3/uL (ref 150–400)
RBC: 2.96 MIL/uL — ABNORMAL LOW (ref 3.87–5.11)
RDW: 14.9 % (ref 11.5–15.5)
WBC: 10.6 10*3/uL — ABNORMAL HIGH (ref 4.0–10.5)
nRBC: 0 % (ref 0.0–0.2)

## 2023-11-06 LAB — BASIC METABOLIC PANEL
Anion gap: 11 (ref 5–15)
BUN: 34 mg/dL — ABNORMAL HIGH (ref 8–23)
CO2: 24 mmol/L (ref 22–32)
Calcium: 8.8 mg/dL — ABNORMAL LOW (ref 8.9–10.3)
Chloride: 102 mmol/L (ref 98–111)
Creatinine, Ser: 1.46 mg/dL — ABNORMAL HIGH (ref 0.44–1.00)
GFR, Estimated: 35 mL/min — ABNORMAL LOW (ref >60)
Glucose, Bld: 97 mg/dL (ref 70–99)
Potassium: 4.2 mmol/L (ref 3.5–5.1)
Sodium: 137 mmol/L (ref 135–145)

## 2023-11-06 MED ORDER — FUROSEMIDE 20 MG PO TABS
20.0000 mg | ORAL_TABLET | Freq: Every day | ORAL | Status: DC
Start: 2023-11-06 — End: 2023-11-07
  Administered 2023-11-06 – 2023-11-07 (×2): 20 mg via ORAL
  Filled 2023-11-06 (×2): qty 1

## 2023-11-06 MED ORDER — AMIODARONE HCL 200 MG PO TABS
200.0000 mg | ORAL_TABLET | Freq: Every day | ORAL | Status: DC
  Administered 2023-11-06 – 2023-11-07 (×2): 200 mg via ORAL
  Filled 2023-11-06 (×2): qty 1

## 2023-11-06 NOTE — TOC Progression Note (Signed)
 Transition of Care [facility]) - Progression Note    Patient Details  Name: Julie Rice MRN: [id number] Date of Birth: Jun 12, 1936  Transition of [facility]) CM/SW Contact  Nola Devere Hands, RN Phone Number: 11/06/2023, 11:34 AM  Clinical Narrative:    Patient is a 88 yr old female, admitted with Decreased LOC, lightheadedness, visual hallucinations. AKI. Case Manager spoke with patient concerning recommendation for home health therapy at discharge. Patient is agreeable, states she has used [facility] in the past and would like to do so now. Referral was called to [facility]. Patient states she has Rollator, walker and a cane, states she has family that checks on her.     Expected Discharge Plan: Home w Home Health Services Barriers to Discharge: No Barriers Identified  Expected Discharge Plan and Services In-house Referral: NA Discharge Planning Services: CM Consult Post Acute Care Choice: Home Health Living arrangements for the past 2 months: Single Family Home                 DME Arranged: N/A DME Agency: NA       HH Arranged: PT, OT HH Agency: [facility] Date HH Agency Contacted: 11/06/23 Time HH Agency Contacted: 1123 Representative spoke with at [facility] Agency: Arna   Social Determinants of Health (SDOH) Interventions SDOH Screenings   Food Insecurity: No Food Insecurity (11/05/2023)  Housing: Low Risk  (08/26/2023)  Transportation Needs: Unmet Transportation Needs (08/26/2023)  Utilities: Not At Risk (11/05/2023)  Alcohol  Screen: Low Risk  (02/14/2022)  Depression (PHQ2-9): Medium Risk (04/01/2023)  Financial Resource Strain: Low Risk  (08/26/2023)  Physical Activity: Unknown (08/26/2023)  Social Connections: Moderately Isolated (08/26/2023)  Stress: Stress Concern Present (08/26/2023)  Tobacco Use: Low Risk  (11/04/2023)    Readmission Risk Interventions    06/06/2023   11:15 AM  Readmission Risk Prevention Plan  Transportation Screening Complete  Social  Work Consult for Recovery Care Planning/Counseling Complete  Palliative Care Screening Not Applicable  Medication Review Oceanographer) Complete

## 2023-11-06 NOTE — Plan of Care (Signed)
  Problem: Clinical Measurements: Goal: Diagnostic test results will improve Outcome: Progressing Goal: Respiratory complications will improve Outcome: Progressing   Problem: Activity: Goal: Risk for activity intolerance will decrease Outcome: Progressing   Problem: Coping: Goal: Level of anxiety will decrease 11/06/2023 0727 by Edith Darice BROCKS, RN Outcome: Progressing 11/06/2023 0700 by Edith Darice BROCKS, RN Outcome: Progressing   Problem: Safety: Goal: Ability to remain free from injury will improve 11/06/2023 0727 by Edith Darice BROCKS, RN Outcome: Progressing 11/06/2023 0700 by Edith Darice BROCKS, RN Outcome: Progressing

## 2023-11-06 NOTE — Plan of Care (Signed)
  Problem: Clinical Measurements: Goal: Diagnostic test results will improve Outcome: Progressing Goal: Respiratory complications will improve Outcome: Progressing   Problem: Coping: Goal: Level of anxiety will decrease Outcome: Progressing   Problem: Safety: Goal: Ability to remain free from injury will improve Outcome: Progressing   

## 2023-11-06 NOTE — Progress Notes (Signed)
 Physical Therapy Treatment Patient Details Name: Julie Rice MRN: [id number] DOB: [birth year] Today's Date: 11/06/2023   History of Present Illness 88 y.o. female with medical history significant for hypertension, CAD, PAF on Eliquis , chronic HFpEF, COPD, chronic hypoxic respiratory failure, and rheumatoid arthritis adm 12/30 with lightheadedness, decreased LOC, and visual hallucinations. Acute encephalopathy, no acute findings noted on head CT. Acute AKI as well.    PT Comments  Pt resting in recliner at start of session and eager to mobilize with therapy. Tremors noted in bil UE with pt drinking water and throughout body with sit<>stands today. Close CGA required for safety with sit<>stand, pt reliant on bil UE to power and no attempt to reach back for controlled lower initially with cues needed for improved technique. Pt amb 3x10' with RW going forward and backward. Min assist on first bout with pt fading to CGA and demonstrating improved management of RW and improved step length and foot placement on each bout. EOS pt agreeable to remain OOB and repositioned for comfort. Will continue to progress pt as able during stay.    If plan is discharge home, recommend the following: Assist for transportation;Help with stairs or ramp for entrance   Can travel by private vehicle        Equipment Recommendations  None recommended by PT    Recommendations for Other Services       Precautions / Restrictions Precautions Precautions: Fall     Mobility  Bed Mobility Overal bed mobility: Needs Assistance Bed Mobility: Supine to Sit     Supine to sit: Supervision, HOB elevated     General bed mobility comments: No physical assist. Incr time    Transfers Overall transfer level: Needs assistance Equipment used: Rollator (4 wheels) Transfers: Sit to/from Stand Sit to Stand: Contact guard assist           General transfer comment: pt reliant on bil UE for power up from recliner and  required cues to reach back for lower.    Ambulation/Gait Ambulation/Gait assistance: Min assist, Contact guard assist Gait Distance (Feet): 10 Feet (3x10 fwd/bkwd) Assistive device: Rollator (4 wheels) Gait Pattern/deviations: Step-through pattern, Decreased stride length, Trunk flexed Gait velocity: decr     General Gait Details: pt with increased tremors today and min assist required initially to stabilize RW with first fwd/bkwd gait bout. stability improved each bout and pt demontrated good control/positioning of RW on final fwd/bkwd gait. chair follow for safety.   Stairs             Wheelchair Mobility     Tilt Bed    Modified Rankin (Stroke Patients Only)       Balance Overall balance assessment: Needs assistance Sitting-balance support: No upper extremity supported, Feet supported Sitting balance-Leahy Scale: Good     Standing balance support: Single extremity supported, Bilateral upper extremity supported, Reliant on assistive device for balance Standing balance-Leahy Scale: Poor Standing balance comment: UE support for static standing                            Cognition Arousal: Alert Behavior During Therapy: WFL for tasks assessed/performed Overall Cognitive Status: Within Functional Limits for tasks assessed                                          Exercises  General Comments        Pertinent Vitals/Pain      Home Living                          Prior Function            PT Goals (current goals can now be found in the care plan section) Acute Rehab PT Goals Patient Stated Goal: return home PT Goal Formulation: With patient Time For Goal Achievement: 11/19/23 Potential to Achieve Goals: Good Progress towards PT goals: Progressing toward goals    Frequency    Min 1X/week      PT Plan      Co-evaluation              AM-PAC PT 6 Clicks Mobility   Outcome Measure  Help  needed turning from your back to your side while in a flat bed without using bedrails?: None Help needed moving from lying on your back to sitting on the side of a flat bed without using bedrails?: A Little Help needed moving to and from a bed to a chair (including a wheelchair)?: A Little Help needed standing up from a chair using your arms (e.g., wheelchair or bedside chair)?: A Little Help needed to walk in hospital room?: A Little Help needed climbing 3-5 steps with a railing? : A Little 6 Click Score: 19    End of Session Equipment Utilized During Treatment: Gait belt Activity Tolerance: Patient tolerated treatment well Patient left: in chair;with call bell/phone within reach;with chair alarm set Nurse Communication: Mobility status PT Visit Diagnosis: Unsteadiness on feet (R26.81);Other abnormalities of gait and mobility (R26.89);Muscle weakness (generalized) (M62.81)     Time: 8777-8757 PT Time Calculation (min) (ACUTE ONLY): 20 min  Charges:    $Gait Training: 8-22 mins PT General Charges $$ ACUTE PT VISIT: 1 Visit                     Vernell DONEEN KLEIN, DPT Acute Rehabilitation Services Office [phone]  11/06/23 5:27 PM

## 2023-11-06 NOTE — Progress Notes (Signed)
 PROGRESS NOTE    Julie Rice  FMW:[id number] DOB: 17-Oct-1936 DOA: 11/03/2023 PCP: Merna Huxley, NP    Brief Narrative:   Julie Rice is a 88 y.o. female with past medical history significant for HTN, paroxysmal atrial fibrillation on Eliquis , chronic diastolic congestive heart failure, CAD, COPD, chronic hypoxic respiratory failure on 2.5 L nasal cannula nocturnally, rheumatoid arthritis who presented to [facility] ED on 12/30 with confusion after being found by her neighbor.  Also patient complaining of lightheadedness, visual hallucinations.  Patient reports she has been feeling generally weak and lightheaded when she stands.  Unable to state how long this has been ongoing for.  She states that the home health nurse was concerned last week due to heart rate in the 30s but no medication changes were made.  She denies headache, no chest pain, abdominal pain, no nausea/vomiting/diarrhea, no fever/chills, no urinary symptoms.  On EMS arrival, patient was noted to be orthostatic with near syncope upon arising.  Patient was transported to hospital for further evaluation and management.  In the ED, temperature 97.7 F, HR 53, RR 17, BP 115/56, SpO2 99% on 3 L nasal cannula.  WBC 13.2, hemoglobin 11.3, platelet count 278.  Sodium 138, potassium 4.9, chloride 104, CO2 23, glucose 121, BUN 40, creat 1.93.  AST 36, ALT 19, total bilirubin 1.0.  Ammonia level less than 10.  High-sensitivity troponin 12 followed by 13.  Urinalysis with large leukocytes, negative nitrite, few bacteria, greater than 50 WBCs.  Chest x-ray with no acute intrathoracic process.  CT head without contrast with no acute intracranial normality, but notable for chronic small vessel disease.  Patient was given 500 mL NS bolus in the ED.  TRH consulted for admission for further evaluation and management of near syncope likely secondary to orthostatic hypotension.  Assessment & Plan:    Acute metabolic encephalopathy: Resolved Patient  was poorly responsive prior to arrival in the ED with associated visual hallucinations and disorientation's.  CT head without contrast with no acute findings.  Ammonia level, TSH within normal limits.  COVID/flu/RSV PCR negative.  Vitamin B12 level within normal limits.  Creatinine elevated 1.93 with baseline 0.85.  Suspect etiology of her presenting symptoms related to volume depletion/dehydration. -- Continue treatment as below  Orthostatic hypotension Near syncope Patient presenting to the ED poorly responsive with near syncopal episode upon EMS arrival.  Patient was reported to have orthostasis after obtaining vitals Joseph at the scene.  Complicated by her use of antihypertensives to include losartan , spironolactone , metoprolol , amiodarone , Lasix .  Also home health RN concerned about bradycardia and no medication changes were made at that time.  TTE with LVEF 77 5%, grade 1 diastolic dysfunction, LA moderately dilated, IVC normal. -- Holding home losartan , spironolactone , metoprolol  -- Restart amiodarone  200 mg p.o. daily today -- Restart furosemide  at a lower dose 20 mg p.o. daily today -- PT/OT recommending home health;, there is placed -- Monitor on telemetry -- Repeat orthostatic vital signs daily -- Anticipate likely need of extensive changes to her antihypertensive regimen and Thomson discharge  Acute renal failure Patient presenting with a creatinine of 1.93 with baseline 0.85 on 06/06/2023.  Etiology likely complicated by her use of losartan , spironolactone , and high-dose furosemide . -- Cr 1.93>1.87>1.47>1.46 -- IV fluids now discontinued, continue to encourage increased oral intake -- Holding home spironolactone , losartan  -- Furosemide  resumed at a lower dose as above -- Avoid nephrotoxins, renally dose all medications -- BMP daily  Essential hypertension Paroxysmal atrial fibrillation on anticoagulation  Chronic diastolic congestive heart failure Recent visit by home health RN  concerned about bradycardia, no medication changes made.  Currently on metoprolol  50 mg p.o. daily, amiodarone  2 mg p.o. daily, losartan  50 mg p.o. daily, spironolactone  25 mg p.o. daily, furosemide  40 mg and 80 mg interchanged every other day. -- Holding home metoprolol , losartan , spironolactone  -- Restarted amiodarone  200 mg p.o. daily today -- Restarted losartan  at reduced dose 20 mg p.o. daily today -- Eliquis  5 mg p.o. twice daily -- Continue monitor on telemetry, close blood pressure monitoring -- Anticipate likely need of extensive changes to her antihypertensive regimen at time of discharge  Chronic hypoxic respiratory failure requiring nocturnal O2 COPD Patient is utilizing 2.5 L per nasal cannula nocturnally at baseline. -- Albuterol  neb every 6 hours as needed wheezing/shortness of breath -- Supplemental oxygen , maintain SpO2 greater than 88%  Anxiety -- Clonazepam  0.5mg  PO BID -- Wllbutrin 300 mg PO daily   HLD -- Crestor  20 g p.o. daily  Weakness/debility/deconditioning: -- PT/OT evaluation: Recommend home health, home health orders placed  DVT prophylaxis:  apixaban  (ELIQUIS ) tablet 5 mg    Code Status: Limited: Do not attempt resuscitation (DNR) -DNR-LIMITED -Do Not Intubate/DNI  Family Communication: Family present at bedside this morning  Disposition Plan:  Level of care: Telemetry Cardiac Status is: Inpatient Remains inpatient appropriate because: Continue to monitor on telemetry, needs further monitoring of heart rate, blood pressure and likely anticipate need of discontinuation/alteration of her antihypertensives, anticipate discharge home likely 1-2 days    Consultants:  None  Procedures:  TTE:   Antimicrobials:  None   Subjective: Patient seen examined bedside, resting comfortable.  Lying in bed.  Continues to complain of her tremors.  Appetite improving, tolerating diet.  Discussed will reinitiate amiodarone  and furosemide  but on a reduced dose.   Renal function continues to slowly improve.  Seen by PT OT with recommendations of home health following hospital discharge.  No other specific questions, concerns or complaints at this time.  Denies headache, no current dizziness, no chest pain, no palpitations, no shortness of breath, no abdominal pain, no fever/chills/night sweats, no nausea/vomiting/diarrhea, no focal weakness, no fatigue, no paresthesias.  No acute events overnight per nursing staff.  Objective: Vitals:   11/06/23 0016 11/06/23 0625 11/06/23 0730 11/06/23 1130  BP: (!) 111/54 (!) 141/78 134/72 (!) 130/49  Pulse: (!) 40 76 80 67  Resp: 14 16 (!) 22 17  Temp: 98.5 F (36.9 C) 97.8 F (36.6 C) 98 F (36.7 C) 97.9 F (36.6 C)  TempSrc: Oral Oral Oral Oral  SpO2: 95% 96% 96% 99%  Weight:  67.9 kg    Height:        Intake/Output Summary (Last 24 hours) at 11/06/2023 1233 Last data filed at 11/06/2023 0800 Gross per 24 hour  Intake 420 ml  Output 1300 ml  Net -880 ml   Filed Weights   11/04/23 1516 11/05/23 0346 11/06/23 0625  Weight: 67.8 kg 68.4 kg 67.9 kg    Examination:  Physical Exam: GEN: NAD, alert and oriented x 3, elderly in appearance; + resting tremor HEENT: NCAT, PERRL, EOMI, sclera clear, MMM PULM: CTAB w/o wheezes/crackles, normal respiratory effort, 2 L nasal cannula (baseline 2.5L nocturnally) CV: Bradycardic, regular rhythm w/o M/G/R GI: abd soft, NTND, NABS, no R/G/M MSK: no peripheral edema, muscle strength globally intact 5/5 bilateral upper/lower extremities NEURO: CN II-XII intact, no focal deficits, sensation to light touch intact PSYCH: normal mood/affect Integumentary: dry/intact, no rashes or wounds  Data Reviewed: I have personally reviewed following labs and imaging studies  CBC: Recent Labs  Lab 11/03/23 2136 11/04/23 0155 11/05/23 0338 11/06/23 0232  WBC 13.2* 12.6* 12.4* 10.6*  NEUTROABS 11.1*  --   --   --   HGB 11.3* 10.6* 10.7* 9.5*  HCT 35.5* 33.7* 33.7* 29.6*   MCV 101.1* 102.4* 100.6* 100.0  PLT 278 245 265 212   Basic Metabolic Panel: Recent Labs  Lab 11/03/23 2230 11/04/23 0155 11/05/23 0338 11/06/23 0232  NA 138 136 135 137  K 4.9 4.3 4.0 4.2  CL 104 105 102 102  CO2 23 23 24 24   GLUCOSE 121* 133* 106* 97  BUN 40* 40* 35* 34*  CREATININE 1.93* 1.87* 1.47* 1.46*  CALCIUM  8.6* 8.5* 9.0 8.8*  MG  --  2.3 2.5*  --   PHOS  --  5.2*  --   --    GFR: Estimated Creatinine Clearance: 26.1 mL/min (A) (by C-G formula based on SCr of 1.46 mg/dL (H)). Liver Function Tests: Recent Labs  Lab 11/03/23 2230  AST 36  ALT 19  ALKPHOS 60  BILITOT 1.0  PROT 6.2*  ALBUMIN 3.3*   No results for input(s): LIPASE, AMYLASE in the last 168 hours. Recent Labs  Lab 11/04/23 0155  AMMONIA <10   Coagulation Profile: No results for input(s): INR, PROTIME in the last 168 hours. Cardiac Enzymes: No results for input(s): CKTOTAL, CKMB, CKMBINDEX, TROPONINI in the last 168 hours. BNP (last 3 results) Recent Labs    02/21/23 1412 03/10/23 1234 03/21/23 1336  PROBNP 2,543* 2,500* 1,817*   HbA1C: No results for input(s): HGBA1C in the last 72 hours. CBG: Recent Labs  Lab 11/03/23 2138  GLUCAP 122*   Lipid Profile: No results for input(s): CHOL, HDL, LDLCALC, TRIG, CHOLHDL, LDLDIRECT in the last 72 hours. Thyroid  Function Tests: Recent Labs    11/04/23 0155  TSH 4.336   Anemia Panel: Recent Labs    11/04/23 0155  VITAMINB12 540   Sepsis Labs: No results for input(s): PROCALCITON, LATICACIDVEN in the last 168 hours.  Recent Results (from the past 240 hours)  Resp panel by RT-PCR (RSV, Flu A&B, Covid) Anterior Nasal Swab     Status: None   Collection Time: 11/03/23 11:37 PM   Specimen: Anterior Nasal Swab  Result Value Ref Range Status   SARS Coronavirus 2 by RT PCR NEGATIVE NEGATIVE Final   Influenza A by PCR NEGATIVE NEGATIVE Final   Influenza B by PCR NEGATIVE NEGATIVE Final    Comment:  (NOTE) The Xpert Xpress SARS-CoV-2/FLU/RSV plus assay is intended as an aid in the diagnosis of influenza from Nasopharyngeal swab specimens and should not be used as a sole basis for treatment. Nasal washings and aspirates are unacceptable for Xpert Xpress SARS-CoV-2/FLU/RSV testing.  Fact Sheet for Patients: bloggercourse.com  Fact Sheet for Healthcare Providers: seriousbroker.it  This test is not yet approved or cleared by the [place]  FDA and has been authorized for detection and/or diagnosis of SARS-CoV-2 by FDA under an Emergency Use Authorization (EUA). This EUA will remain in effect (meaning this test can be used) for the duration of the COVID-19 declaration under Section 564(b)(1) of the Act, 21 U.S.C. section 360bbb-3(b)(1), unless the authorization is terminated or revoked.     Resp Syncytial Virus by PCR NEGATIVE NEGATIVE Final    Comment: (NOTE) Fact Sheet for Patients: bloggercourse.com  Fact Sheet for Healthcare Providers: seriousbroker.it  This test is not yet approved or cleared by the  [place]  FDA and has been authorized for detection and/or diagnosis of SARS-CoV-2 by FDA under an Emergency Use Authorization (EUA). This EUA will remain in effect (meaning this test can be used) for the duration of the COVID-19 declaration under Section 564(b)(1) of the Act, 21 U.S.C. section 360bbb-3(b)(1), unless the authorization is terminated or revoked.  Performed at [facility], [address] [place], [place] [address]          Radiology Studies: ECHOCARDIOGRAM COMPLETE Result Date: 11/04/2023    ECHOCARDIOGRAM REPORT   Patient Name:   TARISA PAOLA Friedland Date of Exam: 11/04/2023 Medical Rec #:  [id number]        Height:       63.0 in Accession #:    [id number]       Weight:       145.0 lb Date of Birth:  1936-06-21         BSA:          1.687 m  Patient Age:    87 years         BP:           103/55 mmHg Patient Gender: F                HR:           60 bpm. Exam Location:  Inpatient Procedure: 2D Echo, Cardiac Doppler, Color Doppler and Intracardiac            Opacification Agent Indications:    R55 Syncope  History:        Patient has prior history of Echocardiogram examinations, most                 recent 06/02/2023. CAD, TIA, Arrythmias:Atrial Fibrillation; Risk                 Factors:Hypertension.  Sonographer:    Lanell Maduro Referring Phys: [id number] Abdo Denault J Nahun Kronberg IMPRESSIONS  1. Left ventricular ejection fraction, by estimation, is 70 to 75%. Left ventricular ejection fraction by 2D MOD biplane is 71.6 %. The left ventricle has hyperdynamic function. The left ventricle has no regional wall motion abnormalities. There is moderate concentric left ventricular hypertrophy. Left ventricular diastolic parameters are consistent with Grade I diastolic dysfunction (impaired relaxation).  2. Right ventricular systolic function is normal. The right ventricular size is normal. There is moderately elevated pulmonary artery systolic pressure. The estimated right ventricular systolic pressure is 46.2 mmHg.  3. Left atrial size was mildly dilated.  4. The mitral valve is grossly normal. Trivial mitral valve regurgitation. Moderate mitral annular calcification.  5. The aortic valve is tricuspid. Aortic valve regurgitation is trivial.  6. The inferior vena cava is normal in size with <50% respiratory variability, suggesting right atrial pressure of 8 mmHg. FINDINGS  Left Ventricle: Left ventricular ejection fraction, by estimation, is 70 to 75%. Left ventricular ejection fraction by 2D MOD biplane is 71.6 %. The left ventricle has hyperdynamic function. The left ventricle has no regional wall motion abnormalities. The left ventricular internal cavity size was normal in size. There is moderate concentric left ventricular hypertrophy. Left ventricular diastolic  parameters are consistent with Grade I diastolic dysfunction (impaired relaxation). Right Ventricle: The right ventricular size is normal. No increase in right ventricular wall thickness. Right ventricular systolic function is normal. There is moderately elevated pulmonary artery systolic pressure. The tricuspid regurgitant velocity is 3.09 m/s, and with an assumed right atrial pressure of 8 mmHg, the estimated  right ventricular systolic pressure is 46.2 mmHg. Left Atrium: Left atrial size was mildly dilated. Right Atrium: Right atrial size was normal in size. Pericardium: Trivial pericardial effusion is present. Mitral Valve: The mitral valve is grossly normal. Moderate mitral annular calcification. Trivial mitral valve regurgitation. Tricuspid Valve: The tricuspid valve is normal in structure. Tricuspid valve regurgitation is mild. Aortic Valve: The aortic valve is tricuspid. Aortic valve regurgitation is trivial. Pulmonic Valve: The pulmonic valve was normal in structure. Pulmonic valve regurgitation is trivial. Aorta: The aortic root and ascending aorta are structurally normal, with no evidence of dilitation. Venous: The inferior vena cava is normal in size with less than 50% respiratory variability, suggesting right atrial pressure of 8 mmHg. IAS/Shunts: The atrial septum is grossly normal.  LEFT VENTRICLE PLAX 2D                        Biplane EF (MOD) LVIDd:         3.60 cm         LV Biplane EF:   Left LVIDs:         2.40 cm                          ventricular LV PW:         1.30 cm                          ejection LV IVS:        1.40 cm                          fraction by LVOT diam:     2.00 cm                          2D MOD LV SV:         91                               biplane is LV SV Index:   54                               71.6 %. LVOT Area:     3.14 cm                                Diastology                                LV e' medial:    3.05 cm/s LV Volumes (MOD)               LV E/e'  medial:  25.7 LV vol d, MOD    56.0 ml       LV e' lateral:   3.81 cm/s A2C:                           LV E/e' lateral: 20.6 LV vol d, MOD    69.6 ml A4C: LV vol s, MOD    16.9 ml A2C: LV vol s, MOD    19.2 ml A4C:  LV SV MOD A2C:   39.1 ml LV SV MOD A4C:   69.6 ml LV SV MOD BP:    45.6 ml RIGHT VENTRICLE RV Basal diam:  4.00 cm RV Mid diam:    3.10 cm RV S prime:     19.80 cm/s TAPSE (M-mode): 3.4 cm LEFT ATRIUM             Index        RIGHT ATRIUM           Index LA diam:        3.60 cm 2.13 cm/m   RA Area:     16.30 cm LA Vol (A2C):   61.0 ml 36.17 ml/m  RA Volume:   33.20 ml  19.68 ml/m LA Vol (A4C):   55.2 ml 32.73 ml/m LA Biplane Vol: 58.6 ml 34.74 ml/m  AORTIC VALVE LVOT Vmax:   135.00 cm/s LVOT Vmean:  96.000 cm/s LVOT VTI:    0.289 m  AORTA Ao Root diam: 2.80 cm Ao Asc diam:  3.30 cm MITRAL VALVE                TRICUSPID VALVE MV Area (PHT): 2.42 cm     TR Peak grad:   38.2 mmHg MV Decel Time: 314 msec     TR Vmax:        309.00 cm/s MV E velocity: 78.30 cm/s MV A velocity: 103.00 cm/s  SHUNTS MV E/A ratio:  0.76         Systemic VTI:  0.29 m                             Systemic Diam: 2.00 cm Aditya Sabharwal Electronically signed by Ria Commander Signature Date/Time: 11/04/2023/4:15:24 PM    Final         Scheduled Meds:  amiodarone   200 mg Oral Daily   apixaban   5 mg Oral BID   buPROPion   300 mg Oral Daily   clonazePAM   0.5 mg Oral BID   DULoxetine   60 mg Oral Daily   furosemide   20 mg Oral Daily   pantoprazole   40 mg Oral BID   rosuvastatin   20 mg Oral q1800   sodium chloride  flush  3 mL Intravenous Q12H   Continuous Infusions:     LOS: 2 days    Time spent: 56 Minutes spent on chart review, discussion with nursing staff, consultants, updating family and interview/physical exam; more than 50% of that time was spent in counseling and/or coordination of care.    Camellia PARAS Ameka Krigbaum, DO [facility] Available via Epic secure chat 7am-7pm After these hours, please  refer to coverage provider listed on [email or web address] 11/06/2023, 12:33 PM

## 2023-11-06 NOTE — Consult Note (Signed)
 [facility] Liaison Consult Note  11/06/2023  Julie Rice 1936-03-20 [id number]  Insurance: [name] Medicare   Primary Care Provider: Merna Huxley, NP with [facility] at [place], this provider is listed for the transition of care follow up appointments  and community [facility] calls   [facility] Liaison screened the patient remotely at [facility].  Spoke with patient via bedside phone to assess for post hospital follow up needs to be addressed.    The patient was screened for hospitalization with noted noted extreme high risk score for unplanned readmission risk 2 hospital admissions in 6 months.  The patient was assessed for potential Community Care Coordination service needs for post hospital transition for care coordination. Patient was generous and accepting, denies any currently known needs. Explained for post hospital follow up and accepting. Reviewed inpatient [facility] team notes and MD notes for needs. Patient currently for home with [facility], [facility] noted.  Plan: [facility] Liaison will continue to follow progress and disposition to asess for post hospital community care coordination/management needs.  Referral request for community care coordination: anticipate [facility] Community TOC team follow up.   [facility] Community Care, Population Health does not replace or interfere with any arrangements made by the Inpatient Transition of Care team.   For questions contact:   Richerd Fish, RN, BSN, CCM [place]  [facility], [facility] Liaison Direct Dial: [phone] or secure chat Email: [email or web address]

## 2023-11-06 NOTE — Progress Notes (Signed)
 Mobility Specialist Progress Note:   11/06/23 1020  Mobility  Activity Stood at bedside;Ambulated with assistance in room  Level of Assistance Contact guard assist, steadying assist  Assistive Device Four wheel walker  Distance Ambulated (ft) 5 ft  Activity Response Tolerated fair (urinary frequency + lightheadedness)  Mobility Referral Yes  Mobility visit 1 Mobility  Mobility Specialist Start Time (ACUTE ONLY) 1020  Mobility Specialist Stop Time (ACUTE ONLY) 1032  Mobility Specialist Time Calculation (min) (ACUTE ONLY) 12 min   Pt agreeable to mobility session. C/o incr tremors and urinary frequency/incontinence. Pt able to perform bed mobility and simple transfers with supervision. Took few steps away from bed, pt incontinent of urine and c/o lightheadedness. Further mobility deferred. Pt cleaned up and back in bed with all needs met. Alarm on.  Therisa Rana Mobility Specialist Please contact via SecureChat or  Rehab office at [phone]

## 2023-11-07 DIAGNOSIS — N179 Acute kidney failure, unspecified: Secondary | ICD-10-CM | POA: Diagnosis not present

## 2023-11-07 LAB — BASIC METABOLIC PANEL
Anion gap: 10 (ref 5–15)
BUN: 29 mg/dL — ABNORMAL HIGH (ref 8–23)
CO2: 25 mmol/L (ref 22–32)
Calcium: 9.1 mg/dL (ref 8.9–10.3)
Chloride: 104 mmol/L (ref 98–111)
Creatinine, Ser: 1.48 mg/dL — ABNORMAL HIGH (ref 0.44–1.00)
GFR, Estimated: 34 mL/min — ABNORMAL LOW (ref >60)
Glucose, Bld: 93 mg/dL (ref 70–99)
Potassium: 4 mmol/L (ref 3.5–5.1)
Sodium: 139 mmol/L (ref 135–145)

## 2023-11-07 LAB — CBC
HCT: 32.5 % — ABNORMAL LOW (ref 36.0–46.0)
Hemoglobin: 10.4 g/dL — ABNORMAL LOW (ref 12.0–15.0)
MCH: 32 pg (ref 26.0–34.0)
MCHC: 32 g/dL (ref 30.0–36.0)
MCV: 100 fL (ref 80.0–100.0)
Platelets: 248 10*3/uL (ref 150–400)
RBC: 3.25 MIL/uL — ABNORMAL LOW (ref 3.87–5.11)
RDW: 14.7 % (ref 11.5–15.5)
WBC: 9.5 10*3/uL (ref 4.0–10.5)
nRBC: 0 % (ref 0.0–0.2)

## 2023-11-07 LAB — MAGNESIUM: Magnesium: 2.2 mg/dL (ref 1.7–2.4)

## 2023-11-07 MED ORDER — FUROSEMIDE 20 MG PO TABS: 20.0000 mg | ORAL_TABLET | Freq: Every day | ORAL | 3 refills | Status: DC

## 2023-11-07 NOTE — TOC Transition Note (Addendum)
 Transition of Care [facility]) - Discharge Note   Patient Details  Name: Julie Rice MRN: [id number] Date of Birth: August 17, 1936  Transition of Care [facility]) CM/SW Contact:  Waddell Barnie Rama, RN Phone Number: 11/07/2023, 10:07 AM   Clinical Narrative:    For dc today, NCM notified Lynette with [facility], patient states her SIL [employer] will transport her home today. HHRN, SW were added to [facility] services.    Final next level of care: Home w Home Health Services Barriers to Discharge: No Barriers Identified   Patient Goals and CMS Choice Patient states their goals for this hospitalization and ongoing recovery are:: get home   Choice offered to / list presented to : Patient      Discharge Placement                       Discharge Plan and Services Additional resources added to the After Visit Summary for   In-house Referral: NA Discharge Planning Services: CM Consult Post Acute Care Choice: Home Health          DME Arranged: N/A DME Agency: NA       HH Arranged: PT, OT HH Agency: [facility] Date HH Agency Contacted: 11/06/23 Time HH Agency Contacted: 1123 Representative spoke with at [facility] Agency: Arna  Social Drivers of Health (SDOH) Interventions SDOH Screenings   Food Insecurity: No Food Insecurity (11/05/2023)  Housing: Low Risk  (08/26/2023)  Transportation Needs: Unmet Transportation Needs (08/26/2023)  Utilities: Not At Risk (11/05/2023)  Alcohol  Screen: Low Risk  (02/14/2022)  Depression (PHQ2-9): Medium Risk (04/01/2023)  Financial Resource Strain: Low Risk  (08/26/2023)  Physical Activity: Unknown (08/26/2023)  Social Connections: Moderately Isolated (08/26/2023)  Stress: Stress Concern Present (08/26/2023)  Tobacco Use: Low Risk  (11/04/2023)     Readmission Risk Interventions    06/06/2023   11:15 AM  Readmission Risk Prevention Plan  Transportation Screening Complete  Social Work Consult for Recovery Care Planning/Counseling Complete   Palliative Care Screening Not Applicable  Medication Review Oceanographer) Complete

## 2023-11-07 NOTE — Progress Notes (Signed)
 Physical Therapy Treatment Patient Details Name: Julie Rice MRN: [id number] DOB: February 28, 1936 Today's Date: 11/07/2023   History of Present Illness 88 y.o. female with medical history significant for hypertension, CAD, PAF on Eliquis , chronic HFpEF, COPD, chronic hypoxic respiratory failure, and rheumatoid arthritis adm 12/30 with lightheadedness, decreased LOC, and visual hallucinations. Acute encephalopathy, no acute findings noted on head CT. Acute AKI as well.    PT Comments  Patient resting in recliner, motivated to work with therapy and expresses readiness to go home. Pt completed sit<>stands today from various surfaces with CGA for safety. CGA for gait in room with RW; pace slow but no overt LOB noted throughout though Lt knee flexing occasionally and pt reports history of Lt knee pain. EOS reviewed benefits of increased assist from family/friends on return home to improve safety and pt verbalized understanding. Will continue to progress pt as able during stay.    If plan is discharge home, recommend the following: Assist for transportation;Help with stairs or ramp for entrance   Can travel by private vehicle        Equipment Recommendations  None recommended by PT    Recommendations for Other Services       Precautions / Restrictions Precautions Precautions: Fall Precaution Comments: Incontinent of urine Restrictions Weight Bearing Restrictions Per Provider Order: No     Mobility  Bed Mobility               General bed mobility comments: pt OOB in recliner    Transfers Overall transfer level: Needs assistance Equipment used: Rolling walker (2 wheels) Transfers: Sit to/from Stand Sit to Stand: Contact guard assist           General transfer comment: pt reliant on bil UE to power up, able to stand from recliner with armrest and standard chair with no armrest placing UE's on RW.    Ambulation/Gait                   Stairs              Wheelchair Mobility     Tilt Bed    Modified Rankin (Stroke Patients Only)       Balance Overall balance assessment: Needs assistance Sitting-balance support: No upper extremity supported, Feet supported Sitting balance-Leahy Scale: Good     Standing balance support: Single extremity supported, Bilateral upper extremity supported, Reliant on assistive device for balance Standing balance-Leahy Scale: Poor Standing balance comment: UE support for static standing                            Cognition Arousal: Alert Behavior During Therapy: WFL for tasks assessed/performed Overall Cognitive Status: Within Functional Limits for tasks assessed                                          Exercises      General Comments        Pertinent Vitals/Pain Pain Assessment Pain Assessment: Faces Faces Pain Scale: Hurts a little bit Pain Location: generalized Pain Descriptors / Indicators: Discomfort Pain Intervention(s): Limited activity within patient's tolerance, Monitored during session, Repositioned    Home Living                          Prior Function  PT Goals (current goals can now be found in the care plan section) Acute Rehab PT Goals PT Goal Formulation: With patient Time For Goal Achievement: 11/19/23 Potential to Achieve Goals: Good Progress towards PT goals: Progressing toward goals    Frequency    Min 1X/week      PT Plan      Co-evaluation              AM-PAC PT 6 Clicks Mobility   Outcome Measure  Help needed turning from your back to your side while in a flat bed without using bedrails?: None Help needed moving from lying on your back to sitting on the side of a flat bed without using bedrails?: A Little Help needed moving to and from a bed to a chair (including a wheelchair)?: A Little Help needed standing up from a chair using your arms (e.g., wheelchair or bedside chair)?: A  Little Help needed to walk in hospital room?: A Little Help needed climbing 3-5 steps with a railing? : A Lot 6 Click Score: 18    End of Session Equipment Utilized During Treatment: Gait belt Activity Tolerance: Patient tolerated treatment well Patient left: in chair;with call bell/phone within reach;with chair alarm set Nurse Communication: Mobility status PT Visit Diagnosis: Unsteadiness on feet (R26.81);Other abnormalities of gait and mobility (R26.89);Muscle weakness (generalized) (M62.81)     Time: 8961-8898 PT Time Calculation (min) (ACUTE ONLY): 23 min  Charges:    $Gait Training: 8-22 mins $Therapeutic Activity: 8-22 mins PT General Charges $$ ACUTE PT VISIT: 1 Visit                     Vernell DONEEN KLEIN, DPT Acute Rehabilitation Services Office [phone]  11/07/23 5:08 PM

## 2023-11-07 NOTE — Progress Notes (Signed)
 RN asked if patient has a court appointed legal guardian. Patient relayed to RN that Shirlean Kelly (sister in Social worker) is Cytogeneticist. Graciella Belton has been notified of patient's d/c.

## 2023-11-07 NOTE — Discharge Summary (Signed)
 Physician Discharge Summary   Patient: Julie Rice MRN: [id number] DOB: 01-13-1936  Admit date:     11/03/2023  Discharge date: 11/07/23  Discharge Physician: Lonni SHAUNNA Dalton   PCP: Merna Huxley, NP     Recommendations at discharge:  Follow up with PCP Huxley Merna in 1 week for AKI  Huxley Merna: Please check BMP (discharge Cr 1.4) and resume spironolactone  and furosemide  as appropriate  Please discuss long-term care/ALF with patient versus having more support at home     Discharge Diagnoses: Principal Problem:   AKI (acute kidney injury) (HCC) Active Problems:   Major depressive disorder, recurrent (HCC)   Generalized anxiety disorder   Hypothyroidism   Essential hypertension   Chronic respiratory failure with hypoxia (HCC)   COPD (chronic obstructive pulmonary disease) (HCC)   PAF (paroxysmal atrial fibrillation) (HCC)   Chronic pain syndrome   Rheumatoid arthritis involving multiple sites (HCC)   Coronary artery disease involving native coronary artery of native heart without angina pectoris   Acute encephalopathy      Hospital Course: 88 y.o. F with PAF on Eliquis , D CHF, CAD, COPD, chronic respiratory failure on 2.5 L, RA, and HTN who presented with weakness, confusion (found by a neighbor complaining of lightheadedness, visual hallucinations, leg weakness).  In the ER, creatinine 1.9 from previous known baseline 0.8.      Acute kidney injury on CKD stage IIIb Patient's prior creatinine up until the summer was 0.7-0.8.  In Sep it was up to 1.3.    Here, Cr was 1.9 on admission, given fluids and diuretics and losartan  held.  Cr improved to 1.4 but then stayed there.  Suspect this is new baseline. -Follow-up creatinine with PCP  Acute metabolic encephalopathy Patient presented with some acute confusion.  This resolved with IV fluids and holding her diuretics.  She was evaluated by PT and OT.  Skilled nursing facility acute rehab was recommended  and the patient declined.  She reported that she does not have 24-hour supervision.  Services were maximized, and she was discharged with recommendation to stay with family or seek long term care.I personally called to her sister in law and discussed discharge plans and my recommendation for long term care.  Benign essential tremor The patient notes about a year of intention tremor.  This is observed by me and is consistent with benign essential tremor.           The [place]  Controlled Substances Registry was reviewed for this patient prior to discharge.   Disposition: Home health Diet recommendation:  Discharge Diet Orders (From admission, onward)     Start     Ordered   11/07/23 0000  Diet - low sodium heart healthy        11/07/23 1015             DISCHARGE MEDICATION: Allergies as of 11/07/2023       Reactions   Tape Other (See Comments)   Band aides, adhesive tape; Redness and pulls skin off   Tramadol  Other (See Comments)   Pt has prolonged Qtc interval of 540- cannot give tramadol  per pharmacy   Morphine Itching, Rash, Other (See Comments)   Flushing        Medication List     STOP taking these medications    losartan  50 MG tablet Commonly known as: COZAAR    spironolactone  25 MG tablet Commonly known as: ALDACTONE        TAKE these medications    acetaminophen   500 MG tablet Commonly known as: TYLENOL  Take 1,000 mg by mouth every 6 (six) hours as needed for moderate pain, headache or fever.   albuterol  108 (90 Base) MCG/ACT inhaler Commonly known as: VENTOLIN  HFA INHALE TWO PUFFS BY MOUTH EVERY 6 HOURS AS NEEDED FOR WHEEZING OR SHORTNESS OF BREATH   amiodarone  200 MG tablet Commonly known as: PACERONE  NEW PRESCRIPTION REQUEST: TAKE ONE TABLET BY MOUTH DAILY   apixaban  5 MG Tabs tablet Commonly known as: Eliquis  Take 1 tablet (5 mg total) by mouth 2 (two) times daily.   buPROPion  300 MG 24 hr tablet Commonly known as: WELLBUTRIN   XL Take 1 tablet (300 mg total) by mouth daily.   cetirizine  10 MG tablet Commonly known as: ZYRTEC  TAKE 1 TABLET BY MOUTH EVERY DAY AS NEEDED FOR ALLERGY What changed: See the new instructions.   clonazePAM  0.5 MG tablet Commonly known as: KLONOPIN  Take 1 tablet (0.5 mg total) by mouth 2 (two) times daily.   DULoxetine  60 MG capsule Commonly known as: CYMBALTA  Take 1 capsule (60 mg total) by mouth daily.   ezetimibe  10 MG tablet Commonly known as: ZETIA  Take 1 tablet (10 mg total) by mouth daily.   Folivane-Plus Caps TAKE ONE CAPSULE BY MOUTH IN THE EARLY MORNING   furosemide  20 MG tablet Commonly known as: LASIX  Take 1 tablet (20 mg total) by mouth daily. Start taking on: November 08, 2023 What changed:  medication strength how much to take how to take this when to take this additional instructions   Klor-Con  M20 20 MEQ tablet Generic drug: potassium chloride  SA TAKE ONE TABLET BY MOUTH DAILY   metoprolol  tartrate 50 MG tablet Commonly known as: LOPRESSOR  Take 1 tablet (50 mg total) by mouth daily.   omeprazole  20 MG capsule Commonly known as: PRILOSEC TAKE 1 CAPSULE BY MOUTH EVERY DAY   One-A-Day Womens 50+ Tabs Take 1 tablet by mouth in the morning.   pantoprazole  40 MG tablet Commonly known as: PROTONIX  Take 1 tablet (40 mg total) by mouth 2 (two) times daily.   polyethylene glycol 17 g packet Commonly known as: MIRALAX  / GLYCOLAX  Take 17 g by mouth daily.   REFRESH TEARS OP Place 1 drop into both eyes 2 (two) times daily.   rosuvastatin  20 MG tablet Commonly known as: CRESTOR  Take 1 tablet (20 mg total) by mouth daily at 6 PM.   sodium chloride  0.65 % Soln nasal spray Commonly known as: OCEAN Place 1 spray into both nostrils at bedtime.        Follow-up Information     [facility] Follow up.   Specialty: Home Health Services Why: Someone from [facility] will contact you to arrange start date and time for your home  health physical therapy. Contact information: [address] [place] [address] [phone]         Merna Huxley, NP. Schedule an appointment as soon as possible for a visit in 1 week(s).   Specialty: Family Medicine Contact information: [address] [place] [address] [phone]                 Discharge Instructions     Diet - low sodium heart healthy   Complete by: As directed    Discharge instructions   Complete by: As directed    **IMPORTANT DISCHARGE INSTRUCTIONS**   From Dr. Jonel: You were admitted for dehydration  Here, your diuretics (medicines that remove fluid from the body)  were held and your dehydration got better  When you go home:  STOP spironolactone  and losartan   CONTINUE furosemide  but reduce the dose to 20 mg daily  Continue your metoprolol , amiodarone , apixaban /Eliquis   Also continue your bupropion /Wellbutrin , duloxetine /Cymbalta , potassium and rosuvastatin /Crestor   Discuss clonazepam  with your doctor, Dr. Nafziger This medicine contributes to weakness and falls in the elderly and is not safe for long term use  Go see Dr. Nafziger in 1 week   Increase activity slowly   Complete by: As directed        Discharge Exam: Filed Weights   11/04/23 1516 11/05/23 0346 11/06/23 0625  Weight: 67.8 kg 68.4 kg 67.9 kg    General: Pt is alert, awake, not in acute distress, sitting up in bed. Cardiovascular: RRR, nl S1-S2, no murmurs appreciated.   No LE edema.   Respiratory: Normal respiratory rate and rhythm.  CTAB without rales or wheezes. Abdominal: Abdomen soft and non-tender.  No distension or HSM.   Neuro/Psych: Strength symmetric in upper and lower extremities.  Judgment and insight appear at baseline.  Oriented to date, place, situation.  Has generalized weakness but symmetric strength.   Condition at discharge: fair  The results of significant diagnostics from this hospitalization (including imaging,  microbiology, ancillary and laboratory) are listed below for reference.   Imaging Studies: ECHOCARDIOGRAM COMPLETE Result Date: 11/04/2023    ECHOCARDIOGRAM REPORT   Patient Name:   Julie Rice Date of Exam: 11/04/2023 Medical Rec #:  [id number]        Height:       63.0 in Accession #:    [id number]       Weight:       145.0 lb Date of Birth:  05-02-1936         BSA:          1.687 m Patient Age:    87 years         BP:           103/55 mmHg Patient Gender: F                HR:           60 bpm. Exam Location:  Inpatient Procedure: 2D Echo, Cardiac Doppler, Color Doppler and Intracardiac            Opacification Agent Indications:    R55 Syncope  History:        Patient has prior history of Echocardiogram examinations, most                 recent 06/02/2023. CAD, TIA, Arrythmias:Atrial Fibrillation; Risk                 Factors:Hypertension.  Sonographer:    Lanell Maduro Referring Phys: [id number] ERIC J AUSTRIA IMPRESSIONS  1. Left ventricular ejection fraction, by estimation, is 70 to 75%. Left ventricular ejection fraction by 2D MOD biplane is 71.6 %. The left ventricle has hyperdynamic function. The left ventricle has no regional wall motion abnormalities. There is moderate concentric left ventricular hypertrophy. Left ventricular diastolic parameters are consistent with Grade I diastolic dysfunction (impaired relaxation).  2. Right ventricular systolic function is normal. The right ventricular size is normal. There is moderately elevated pulmonary artery systolic pressure. The estimated right ventricular systolic pressure is 46.2 mmHg.  3. Left atrial size was mildly dilated.  4. The mitral valve is grossly normal. Trivial mitral valve regurgitation. Moderate mitral annular calcification.  5. The aortic valve is tricuspid.  Aortic valve regurgitation is trivial.  6. The inferior vena cava is normal in size with <50% respiratory variability, suggesting right atrial pressure of 8 mmHg. FINDINGS  Left  Ventricle: Left ventricular ejection fraction, by estimation, is 70 to 75%. Left ventricular ejection fraction by 2D MOD biplane is 71.6 %. The left ventricle has hyperdynamic function. The left ventricle has no regional wall motion abnormalities. The left ventricular internal cavity size was normal in size. There is moderate concentric left ventricular hypertrophy. Left ventricular diastolic parameters are consistent with Grade I diastolic dysfunction (impaired relaxation). Right Ventricle: The right ventricular size is normal. No increase in right ventricular wall thickness. Right ventricular systolic function is normal. There is moderately elevated pulmonary artery systolic pressure. The tricuspid regurgitant velocity is 3.09 m/s, and with an assumed right atrial pressure of 8 mmHg, the estimated right ventricular systolic pressure is 46.2 mmHg. Left Atrium: Left atrial size was mildly dilated. Right Atrium: Right atrial size was normal in size. Pericardium: Trivial pericardial effusion is present. Mitral Valve: The mitral valve is grossly normal. Moderate mitral annular calcification. Trivial mitral valve regurgitation. Tricuspid Valve: The tricuspid valve is normal in structure. Tricuspid valve regurgitation is mild. Aortic Valve: The aortic valve is tricuspid. Aortic valve regurgitation is trivial. Pulmonic Valve: The pulmonic valve was normal in structure. Pulmonic valve regurgitation is trivial. Aorta: The aortic root and ascending aorta are structurally normal, with no evidence of dilitation. Venous: The inferior vena cava is normal in size with less than 50% respiratory variability, suggesting right atrial pressure of 8 mmHg. IAS/Shunts: The atrial septum is grossly normal.  LEFT VENTRICLE PLAX 2D                        Biplane EF (MOD) LVIDd:         3.60 cm         LV Biplane EF:   Left LVIDs:         2.40 cm                          ventricular LV PW:         1.30 cm                          ejection LV  IVS:        1.40 cm                          fraction by LVOT diam:     2.00 cm                          2D MOD LV SV:         91                               biplane is LV SV Index:   54                               71.6 %. LVOT Area:     3.14 cm  Diastology                                LV e' medial:    3.05 cm/s LV Volumes (MOD)               LV E/e' medial:  25.7 LV vol d, MOD    56.0 ml       LV e' lateral:   3.81 cm/s A2C:                           LV E/e' lateral: 20.6 LV vol d, MOD    69.6 ml A4C: LV vol s, MOD    16.9 ml A2C: LV vol s, MOD    19.2 ml A4C: LV SV MOD A2C:   39.1 ml LV SV MOD A4C:   69.6 ml LV SV MOD BP:    45.6 ml RIGHT VENTRICLE RV Basal diam:  4.00 cm RV Mid diam:    3.10 cm RV S prime:     19.80 cm/s TAPSE (M-mode): 3.4 cm LEFT ATRIUM             Index        RIGHT ATRIUM           Index LA diam:        3.60 cm 2.13 cm/m   RA Area:     16.30 cm LA Vol (A2C):   61.0 ml 36.17 ml/m  RA Volume:   33.20 ml  19.68 ml/m LA Vol (A4C):   55.2 ml 32.73 ml/m LA Biplane Vol: 58.6 ml 34.74 ml/m  AORTIC VALVE LVOT Vmax:   135.00 cm/s LVOT Vmean:  96.000 cm/s LVOT VTI:    0.289 m  AORTA Ao Root diam: 2.80 cm Ao Asc diam:  3.30 cm MITRAL VALVE                TRICUSPID VALVE MV Area (PHT): 2.42 cm     TR Peak grad:   38.2 mmHg MV Decel Time: 314 msec     TR Vmax:        309.00 cm/s MV E velocity: 78.30 cm/s MV A velocity: 103.00 cm/s  SHUNTS MV E/A ratio:  0.76         Systemic VTI:  0.29 m                             Systemic Diam: 2.00 cm Aditya Sabharwal Electronically signed by Ria Commander Signature Date/Time: 11/04/2023/4:15:24 PM    Final    CT Head Wo Contrast Result Date: 11/04/2023 CLINICAL DATA:  Altered mental status EXAM: CT HEAD WITHOUT CONTRAST TECHNIQUE: Contiguous axial images were obtained from the base of the skull through the vertex without intravenous contrast. RADIATION DOSE REDUCTION: This exam was performed according to the  departmental dose-optimization program which includes automated exposure control, adjustment of the mA and/or kV according to patient size and/or use of iterative reconstruction technique. COMPARISON:  06/01/2023 FINDINGS: Brain: There is no mass, hemorrhage or extra-axial collection. The size and configuration of the ventricles and extra-axial CSF spaces are normal. There is hypoattenuation of the white matter, most commonly indicating chronic small vessel disease. Vascular: No hyperdense vessel or unexpected vascular calcification. Skull: The visualized skull base, calvarium and extracranial soft tissues are normal. Sinuses/Orbits: No fluid levels or advanced mucosal thickening  of the visualized paranasal sinuses. No mastoid or middle ear effusion. Normal orbits. Other: None. IMPRESSION: 1. No acute intracranial abnormality. 2. Chronic small vessel disease. Electronically Signed   By: Franky Stanford M.D.   On: 11/04/2023 01:15   DG Chest Port 1 View Result Date: 11/03/2023 CLINICAL DATA:  Syncope EXAM: PORTABLE CHEST 1 VIEW COMPARISON:  06/01/2023 FINDINGS: Single frontal view of the chest demonstrates a stable cardiac silhouette. No airspace disease, effusion, or pneumothorax. No acute bony abnormalities. IMPRESSION: 1. No acute intrathoracic process. Electronically Signed   By: Ozell Daring M.D.   On: 11/03/2023 22:32    Microbiology: Results for orders placed or performed during the hospital encounter of 11/03/23  Resp panel by RT-PCR (RSV, Flu A&B, Covid) Anterior Nasal Swab     Status: None   Collection Time: 11/03/23 11:37 PM   Specimen: Anterior Nasal Swab  Result Value Ref Range Status   SARS Coronavirus 2 by RT PCR NEGATIVE NEGATIVE Final   Influenza A by PCR NEGATIVE NEGATIVE Final   Influenza B by PCR NEGATIVE NEGATIVE Final    Comment: (NOTE) The Xpert Xpress SARS-CoV-2/FLU/RSV plus assay is intended as an aid in the diagnosis of influenza from Nasopharyngeal swab specimens  and should not be used as a sole basis for treatment. Nasal washings and aspirates are unacceptable for Xpert Xpress SARS-CoV-2/FLU/RSV testing.  Fact Sheet for Patients: bloggercourse.com  Fact Sheet for Healthcare Providers: seriousbroker.it  This test is not yet approved or cleared by the [place]  FDA and has been authorized for detection and/or diagnosis of SARS-CoV-2 by FDA under an Emergency Use Authorization (EUA). This EUA will remain in effect (meaning this test can be used) for the duration of the COVID-19 declaration under Section 564(b)(1) of the Act, 21 U.S.C. section 360bbb-3(b)(1), unless the authorization is terminated or revoked.     Resp Syncytial Virus by PCR NEGATIVE NEGATIVE Final    Comment: (NOTE) Fact Sheet for Patients: bloggercourse.com  Fact Sheet for Healthcare Providers: seriousbroker.it  This test is not yet approved or cleared by the [place]  FDA and has been authorized for detection and/or diagnosis of SARS-CoV-2 by FDA under an Emergency Use Authorization (EUA). This EUA will remain in effect (meaning this test can be used) for the duration of the COVID-19 declaration under Section 564(b)(1) of the Act, 21 U.S.C. section 360bbb-3(b)(1), unless the authorization is terminated or revoked.  Performed at [facility], [address] [place], [place] [address]    *Note: Due to a large number of results and/or encounters for the requested time period, some results have not been displayed. A complete set of results can be found in Results Review.    Labs: CBC: Recent Labs  Lab 11/03/23 2136 11/04/23 0155 11/05/23 0338 11/06/23 0232 11/07/23 0257  WBC 13.2* 12.6* 12.4* 10.6* 9.5  NEUTROABS 11.1*  --   --   --   --   HGB 11.3* 10.6* 10.7* 9.5* 10.4*  HCT 35.5* 33.7* 33.7* 29.6* 32.5*  MCV 101.1* 102.4* 100.6* 100.0 100.0   PLT 278 245 265 212 248   Basic Metabolic Panel: Recent Labs  Lab 11/03/23 2230 11/04/23 0155 11/05/23 0338 11/06/23 0232 11/07/23 0257  NA 138 136 135 137 139  K 4.9 4.3 4.0 4.2 4.0  CL 104 105 102 102 104  CO2 23 23 24 24 25   GLUCOSE 121* 133* 106* 97 93  BUN 40* 40* 35* 34* 29*  CREATININE 1.93* 1.87* 1.47* 1.46* 1.48*  CALCIUM  8.6* 8.5* 9.0 8.8* 9.1  MG  --  2.3 2.5*  --  2.2  PHOS  --  5.2*  --   --   --    Liver Function Tests: Recent Labs  Lab 11/03/23 2230  AST 36  ALT 19  ALKPHOS 60  BILITOT 1.0  PROT 6.2*  ALBUMIN 3.3*   CBG: Recent Labs  Lab 11/03/23 2138  GLUCAP 122*    Discharge time spent: approximately 45 minutes spent on discharge counseling, evaluation of patient on day of discharge, and coordination of discharge planning with nursing, social work, pharmacy and case management  Signed: Lonni SHAUNNA Dalton, MD [facility] 11/07/2023

## 2023-11-07 NOTE — Progress Notes (Addendum)
 Occupational Therapy Treatment Patient Details Name: Julie Rice MRN: [id number] DOB: 1936/09/04 Today's Date: 11/07/2023   History of present illness 88 y.o. female with medical history significant for hypertension, CAD, PAF on Eliquis , chronic HFpEF, COPD, chronic hypoxic respiratory failure, and rheumatoid arthritis adm 12/30 with lightheadedness, decreased LOC, and visual hallucinations. Acute encephalopathy, no acute findings noted on head CT. Acute AKI as well.   OT comments  Pt. Seen for skilled OT treatment session.  Able to complete bed mobility with S.  LB dressing seated eob with increased time.  Step pivot to bsc with cga.  Short distance ambulation with CGA/S.  Initial D/c recommendation for [facility] but feel this would be safer if pt. Has 24/7 available.  If not, initial eval states pt. Receptive to snf for continued therapies <3hrs/day.        If plan is discharge home, recommend the following:  Assist for transportation;Supervision due to cognitive status   Equipment Recommendations  None recommended by OT    Recommendations for Other Services      Precautions / Restrictions Precautions Precautions: Fall Precaution Comments: Incontinent of urine       Mobility Bed Mobility Overal bed mobility: Needs Assistance Bed Mobility: Supine to Sit     Supine to sit: Supervision, HOB elevated     General bed mobility comments: No physical assist. Incr time    Transfers Overall transfer level: Needs assistance Equipment used: Rolling walker (2 wheels) Transfers: Sit to/from Stand, Bed to chair/wheelchair/BSC Sit to Stand: Contact guard assist     Step pivot transfers: Contact guard assist           Balance                                           ADL either performed or assessed with clinical judgement   ADL Overall ADL's : Needs assistance/impaired                     Lower Body Dressing: Set up;Sitting/lateral leans Lower Body  Dressing Details (indicate cue type and reason): able to perform  figure 4 with increased time.  reviewed reacher and sock aide she states I have so much of that stuff (referring to a/e/dme from previous sx.) Toilet Transfer: Contact guard assist;BSC/3in1;Stand-pivot;Cueing for sequencing Toilet Transfer Details (indicate cue type and reason): cues for hand placement, able to complete stand pivot from eob to bsc Toileting- Clothing Manipulation and Hygiene: Set up;Sitting/lateral lean       Functional mobility during ADLs: Minimal assistance;Rolling walker (2 wheels)      Extremity/Trunk Assessment              Vision       Perception     Praxis      Cognition Arousal: Alert Behavior During Therapy: WFL for tasks assessed/performed Overall Cognitive Status: Within Functional Limits for tasks assessed                                          Exercises      Shoulder Instructions       General Comments      Pertinent Vitals/ Pain       Pain Assessment Pain Assessment: Faces Pain Score: 3  Pain Location: states  somewhat of a constant with fibromyalgia and arthritis but no new specific pains Pain Descriptors / Indicators: Aching Pain Intervention(s): Limited activity within patient's tolerance, Monitored during session, Repositioned  Home Living                                          Prior Functioning/Environment              Frequency  Min 1X/week        Progress Toward Goals  OT Goals(current goals can now be found in the care plan section)  Progress towards OT goals: Progressing toward goals     Plan      Co-evaluation                 AM-PAC OT 6 Clicks Daily Activity     Outcome Measure   Help from another person eating meals?: None Help from another person taking care of personal grooming?: None Help from another person toileting, which includes using toliet, bedpan, or urinal?: A  Little Help from another person bathing (including washing, rinsing, drying)?: A Little Help from another person to put on and taking off regular upper body clothing?: None Help from another person to put on and taking off regular lower body clothing?: A Little 6 Click Score: 21    End of Session Equipment Utilized During Treatment: Rolling walker (2 wheels)  OT Visit Diagnosis: Unsteadiness on feet (R26.81);Muscle weakness (generalized) (M62.81);Other symptoms and signs involving cognitive function   Activity Tolerance Patient tolerated treatment well   Patient Left in chair;with call bell/phone within reach;with chair alarm set   Nurse Communication Mobility status;Other (comment) (secure chat all pt. updates at end of session)        Time: 0912-0935 OT Time Calculation (min): 23 min  Charges: OT General Charges $OT Visit: 1 Visit OT Treatments $Self Care/Home Management : 23-37 mins  Randall, COTA/L Acute Rehabilitation [phone]   CHRISTELLA Nest Lorraine-COTA/L 11/07/2023, 9:37 AM

## 2023-11-08 DIAGNOSIS — I429 Cardiomyopathy, unspecified: Secondary | ICD-10-CM | POA: Diagnosis not present

## 2023-11-08 DIAGNOSIS — R441 Visual hallucinations: Secondary | ICD-10-CM | POA: Diagnosis not present

## 2023-11-08 DIAGNOSIS — I480 Paroxysmal atrial fibrillation: Secondary | ICD-10-CM | POA: Diagnosis not present

## 2023-11-08 DIAGNOSIS — I083 Combined rheumatic disorders of mitral, aortic and tricuspid valves: Secondary | ICD-10-CM | POA: Diagnosis not present

## 2023-11-08 DIAGNOSIS — J9611 Chronic respiratory failure with hypoxia: Secondary | ICD-10-CM | POA: Diagnosis not present

## 2023-11-08 DIAGNOSIS — N179 Acute kidney failure, unspecified: Secondary | ICD-10-CM | POA: Diagnosis not present

## 2023-11-08 DIAGNOSIS — I471 Supraventricular tachycardia, unspecified: Secondary | ICD-10-CM | POA: Diagnosis not present

## 2023-11-08 DIAGNOSIS — M810 Age-related osteoporosis without current pathological fracture: Secondary | ICD-10-CM | POA: Diagnosis not present

## 2023-11-08 DIAGNOSIS — J4489 Other specified chronic obstructive pulmonary disease: Secondary | ICD-10-CM | POA: Diagnosis not present

## 2023-11-08 DIAGNOSIS — I447 Left bundle-branch block, unspecified: Secondary | ICD-10-CM | POA: Diagnosis not present

## 2023-11-08 DIAGNOSIS — G9340 Encephalopathy, unspecified: Secondary | ICD-10-CM | POA: Diagnosis not present

## 2023-11-08 DIAGNOSIS — M069 Rheumatoid arthritis, unspecified: Secondary | ICD-10-CM | POA: Diagnosis not present

## 2023-11-08 DIAGNOSIS — I2510 Atherosclerotic heart disease of native coronary artery without angina pectoris: Secondary | ICD-10-CM | POA: Diagnosis not present

## 2023-11-08 DIAGNOSIS — I110 Hypertensive heart disease with heart failure: Secondary | ICD-10-CM | POA: Diagnosis not present

## 2023-11-08 DIAGNOSIS — I5032 Chronic diastolic (congestive) heart failure: Secondary | ICD-10-CM | POA: Diagnosis not present

## 2023-11-08 DIAGNOSIS — M797 Fibromyalgia: Secondary | ICD-10-CM | POA: Diagnosis not present

## 2023-11-10 ENCOUNTER — Telehealth: Payer: Self-pay

## 2023-11-10 ENCOUNTER — Other Ambulatory Visit: Payer: Self-pay | Admitting: Internal Medicine

## 2023-11-10 ENCOUNTER — Other Ambulatory Visit: Payer: Self-pay | Admitting: Adult Health

## 2023-11-10 DIAGNOSIS — I480 Paroxysmal atrial fibrillation: Secondary | ICD-10-CM | POA: Diagnosis not present

## 2023-11-10 DIAGNOSIS — F419 Anxiety disorder, unspecified: Secondary | ICD-10-CM

## 2023-11-10 DIAGNOSIS — I2510 Atherosclerotic heart disease of native coronary artery without angina pectoris: Secondary | ICD-10-CM | POA: Diagnosis not present

## 2023-11-10 DIAGNOSIS — I471 Supraventricular tachycardia, unspecified: Secondary | ICD-10-CM | POA: Diagnosis not present

## 2023-11-10 DIAGNOSIS — N179 Acute kidney failure, unspecified: Secondary | ICD-10-CM | POA: Diagnosis not present

## 2023-11-10 DIAGNOSIS — J9611 Chronic respiratory failure with hypoxia: Secondary | ICD-10-CM | POA: Diagnosis not present

## 2023-11-10 DIAGNOSIS — M810 Age-related osteoporosis without current pathological fracture: Secondary | ICD-10-CM | POA: Diagnosis not present

## 2023-11-10 DIAGNOSIS — I5032 Chronic diastolic (congestive) heart failure: Secondary | ICD-10-CM | POA: Diagnosis not present

## 2023-11-10 DIAGNOSIS — J4489 Other specified chronic obstructive pulmonary disease: Secondary | ICD-10-CM | POA: Diagnosis not present

## 2023-11-10 DIAGNOSIS — I429 Cardiomyopathy, unspecified: Secondary | ICD-10-CM | POA: Diagnosis not present

## 2023-11-10 DIAGNOSIS — I110 Hypertensive heart disease with heart failure: Secondary | ICD-10-CM | POA: Diagnosis not present

## 2023-11-10 DIAGNOSIS — M069 Rheumatoid arthritis, unspecified: Secondary | ICD-10-CM | POA: Diagnosis not present

## 2023-11-10 DIAGNOSIS — G9340 Encephalopathy, unspecified: Secondary | ICD-10-CM | POA: Diagnosis not present

## 2023-11-10 DIAGNOSIS — M797 Fibromyalgia: Secondary | ICD-10-CM | POA: Diagnosis not present

## 2023-11-10 DIAGNOSIS — I447 Left bundle-branch block, unspecified: Secondary | ICD-10-CM | POA: Diagnosis not present

## 2023-11-10 DIAGNOSIS — R441 Visual hallucinations: Secondary | ICD-10-CM | POA: Diagnosis not present

## 2023-11-10 DIAGNOSIS — I083 Combined rheumatic disorders of mitral, aortic and tricuspid valves: Secondary | ICD-10-CM | POA: Diagnosis not present

## 2023-11-10 NOTE — Transitions of Care (Post Inpatient/ED Visit) (Signed)
 11/10/2023  Name: Julie Rice MRN: [id number] DOB: 14-Jul-1936  Today's TOC FU Call Status: Today's TOC FU Call Status:: Successful TOC FU Call Completed TOC FU Call Complete Date: 11/10/23 Patient's Name and Date of Birth confirmed.  Transition Care Management Follow-up Telephone Call Date of Discharge: 11/07/23 Discharge Facility: [facility]) Type of Discharge: Inpatient Admission Primary Inpatient Discharge Diagnosis:: near syncope How have you been since you were released from the hospital?: Better Any questions or concerns?: No  Items Reviewed: Did you receive and understand the discharge instructions provided?: Yes Medications obtained,verified, and reconciled?: Yes (Medications Reviewed) Any new allergies since your discharge?: No Dietary orders reviewed?: Yes Type of Diet Ordered:: low salt/heart healthy Do you have support at home?: Yes People in Home: alone Name of Support/Comfort Primary Source: neighbor checks on her, brother and sister in law lives in [place] and assists her as needed  Medications Reviewed Today: Medications Reviewed Today     Reviewed by Rochel Rama Loose, RN (Registered Nurse) on 11/10/23 at 1013  Med List Status: <None>   Medication Order Taking? Sig Documenting Provider Last Dose Status Informant  acetaminophen  (TYLENOL ) 500 MG tablet [id number] Yes Take 1,000 mg by mouth every 6 (six) hours as needed for moderate pain, headache or fever. [provider] 11/09/2023 Active Self, Pharmacy Records  albuterol  (VENTOLIN  HFA) 108 [phone] Base) MCG/ACT inhaler [id number] Yes INHALE TWO PUFFS BY MOUTH EVERY 6 HOURS AS NEEDED FOR WHEEZING OR SHORTNESS OF SHERIDA Shape, Darleene, NP Taking Active Self, Pharmacy Records  amiodarone  (PACERONE ) 200 MG tablet [id number] Yes NEW PRESCRIPTION REQUEST: TAKE ONE TABLET BY MOUTH DAILY Nafziger, Darleene, NP 11/09/2023 Active Self, Pharmacy Records  apixaban  (ELIQUIS ) 5 MG TABS tablet [id number] Yes  Take 1 tablet (5 mg total) by mouth 2 (two) times daily. Okey Vina GAILS, MD 11/09/2023 Active Self, Pharmacy Records  buPROPion  (WELLBUTRIN  XL) 300 MG 24 hr tablet [id number] Yes Take 1 tablet (300 mg total) by mouth daily. Shape Darleene, NP 11/09/2023 Active Self, Pharmacy Records  Carboxymethylcellulose Sodium (REFRESH TEARS OP) [id number] Yes Place 1 drop into both eyes 2 (two) times daily. [provider] 11/09/2023 Active Self, Pharmacy Records  cetirizine  (ZYRTEC ) 10 MG tablet [id number] Yes TAKE 1 TABLET BY MOUTH EVERY DAY AS NEEDED FOR ALLERGY  Patient taking differently: Take 10 mg by mouth daily as needed for allergies.   Nafziger, [name], NP 11/09/2023 Active Self, Pharmacy Records  clonazePAM  (KLONOPIN ) 0.5 MG tablet [id number] Yes Take 1 tablet (0.5 mg total) by mouth 2 (two) times daily. Shape Darleene, NP 11/09/2023 Active Self, Pharmacy Records  DULoxetine  (CYMBALTA ) 60 MG capsule [id number] Yes Take 1 capsule (60 mg total) by mouth daily. Nafziger, [name], NP 11/09/2023 Active Self, Pharmacy Records  ezetimibe  (ZETIA ) 10 MG tablet [id number] Yes Take 1 tablet (10 mg total) by mouth daily. Shlomo Wilbert SAUNDERS, MD 11/09/2023 Active Self, Pharmacy Records  FeFum-FePoly-FA-B Cmp-C-Biot [facility]) CAPS [id number] Yes TAKE ONE CAPSULE BY MOUTH IN THE EARLY MORNING Heilingoetter, Cassandra L, PA-C 11/09/2023 Active Self, Pharmacy Records  furosemide  (LASIX ) 20 MG tablet [id number] Yes Take 1 tablet (20 mg total) by mouth daily. Jonel Lonni SQUIBB, MD 11/09/2023 Active   KLOR-CON  M20 20 MEQ tablet [id number] Yes TAKE ONE TABLET BY MOUTH DAILY Nishan, Peter C, MD 11/09/2023 Active Self, Pharmacy Records  metoprolol  tartrate (LOPRESSOR ) 50 MG tablet [id number] Yes Take 1 tablet (50 mg total) by mouth daily. Okey Vina GAILS, MD 11/09/2023 Active Self, Pharmacy Records  Multiple Vitamins-Minerals (ONE-A-DAY WOMENS 50+)  TABS [id number] Yes Take 1 tablet by mouth in the morning. [provider] 11/09/2023 Active Self,  Pharmacy Records  omeprazole  (PRILOSEC) 20 MG capsule [id number] Yes TAKE 1 CAPSULE BY MOUTH EVERY DAY Nafziger, [name], NP 11/09/2023 Active Self, Pharmacy Records  pantoprazole  (PROTONIX ) 40 MG tablet [id number] Yes Take 1 tablet (40 mg total) by mouth 2 (two) times daily. Abran Norleen SAILOR, MD 11/09/2023 Active Self, Pharmacy Records  polyethylene glycol (MIRALAX  / GLYCOLAX ) 17 g packet [id number] Yes Take 17 g by mouth daily. Raenelle Donalda HERO, MD Taking Active Self, Pharmacy Records  rosuvastatin  (CRESTOR ) 20 MG tablet [id number] Yes Take 1 tablet (20 mg total) by mouth daily at 6 PM. Merna Huxley, NP 11/09/2023 Active Self, Pharmacy Records  sodium chloride  (OCEAN) 0.65 % SOLN nasal spray [id number] Yes Place 1 spray into both nostrils at bedtime. [provider] 11/09/2023 Active Self, Pharmacy Records            Home Care and Equipment/Supplies: Were Home Health Services Ordered?: Yes Name of Home Health Agency:: [facility] Has Agency set up a time to come to your home?: Yes First Home Health Visit Date: 11/08/23 Any new equipment or medical supplies ordered?: No  Functional Questionnaire: Do you need assistance with bathing/showering or dressing?: No Do you need assistance with meal preparation?: No Do you need assistance with eating?: No Do you have difficulty maintaining continence: No Do you need assistance with getting out of bed/getting out of a chair/moving?: No Do you have difficulty managing or taking your medications?: No  Follow up appointments reviewed: PCP Follow-up appointment confirmed?: Yes Date of PCP follow-up appointment?: 11/18/23 Follow-up Provider: [facility] Follow-up appointment confirmed?: NA Do you need transportation to your follow-up appointment?: No (pt voices sister in law takes her to all appts) Do you understand care options if your condition(s) worsen?: Yes-patient verbalized understanding  SDOH Interventions Today     Flowsheet Row Most Recent Value  SDOH Interventions   Food Insecurity Interventions Intervention Not Indicated  [pt voices she prepares small/simple meals, healthy choices-frozen meals or neighbors prepare meals for her-offered SW referral for Meals on Wheel or other resources/support but pt declined, states insurance plan provides post d/c plans]  Housing Interventions Intervention Not Indicated  Transportation Interventions Intervention Not Indicated  [pt confirms her sister in law takes het to appts and runs errands for her]  Utilities Interventions Intervention Not Indicated      Interventions Today    Flowsheet Row Most Recent Value  Chronic Disease   Chronic disease during today's visit Hypertension (HTN), Chronic Obstructive Pulmonary Disease (COPD)  General Interventions   General Interventions Discussed/Reviewed General Interventions Discussed, Doctor Visits, Durable Medical Equipment (DME)  Doctor Visits Discussed/Reviewed Doctor Visits Discussed, PCP  Durable Medical Equipment (DME) Walker  PCP/Specialist Visits Compliance with follow-up visit  Education Interventions   Education Provided Provided Education  Provided Verbal Education On Nutrition, When to see the doctor, Medication, Other  Nutrition Interventions   Nutrition Discussed/Reviewed Nutrition Discussed  Pharmacy Interventions   Pharmacy Dicussed/Reviewed Pharmacy Topics Discussed, Medications and their functions  Safety Interventions   Safety Discussed/Reviewed Safety Discussed, Fall Risk, Home Safety  Home Safety Assistive Devices       TOC Interventions Today    Flowsheet Row Most Recent Value  TOC Interventions   TOC Interventions Discussed/Reviewed TOC Interventions Discussed, Arranged PCP follow up less than 12 days/Care Guide scheduled       Rama Pilling, RN,BSN,CCM RN Care Manager Transitions of Care  [place]  Direct Phone: [phone] Toll Free:  [phone] Fax: [phone]

## 2023-11-11 ENCOUNTER — Other Ambulatory Visit: Payer: Self-pay | Admitting: Adult Health

## 2023-11-11 DIAGNOSIS — G9341 Metabolic encephalopathy: Secondary | ICD-10-CM | POA: Diagnosis not present

## 2023-11-11 DIAGNOSIS — R531 Weakness: Secondary | ICD-10-CM | POA: Diagnosis not present

## 2023-11-11 DIAGNOSIS — I951 Orthostatic hypotension: Secondary | ICD-10-CM | POA: Diagnosis not present

## 2023-11-11 NOTE — Telephone Encounter (Signed)
 Okay for refill?

## 2023-11-12 DIAGNOSIS — R441 Visual hallucinations: Secondary | ICD-10-CM | POA: Diagnosis not present

## 2023-11-12 DIAGNOSIS — I110 Hypertensive heart disease with heart failure: Secondary | ICD-10-CM | POA: Diagnosis not present

## 2023-11-12 DIAGNOSIS — M810 Age-related osteoporosis without current pathological fracture: Secondary | ICD-10-CM | POA: Diagnosis not present

## 2023-11-12 DIAGNOSIS — G9340 Encephalopathy, unspecified: Secondary | ICD-10-CM | POA: Diagnosis not present

## 2023-11-12 DIAGNOSIS — I471 Supraventricular tachycardia, unspecified: Secondary | ICD-10-CM | POA: Diagnosis not present

## 2023-11-12 DIAGNOSIS — I429 Cardiomyopathy, unspecified: Secondary | ICD-10-CM | POA: Diagnosis not present

## 2023-11-12 DIAGNOSIS — J9611 Chronic respiratory failure with hypoxia: Secondary | ICD-10-CM | POA: Diagnosis not present

## 2023-11-12 DIAGNOSIS — I2510 Atherosclerotic heart disease of native coronary artery without angina pectoris: Secondary | ICD-10-CM | POA: Diagnosis not present

## 2023-11-12 DIAGNOSIS — I447 Left bundle-branch block, unspecified: Secondary | ICD-10-CM | POA: Diagnosis not present

## 2023-11-12 DIAGNOSIS — N179 Acute kidney failure, unspecified: Secondary | ICD-10-CM | POA: Diagnosis not present

## 2023-11-12 DIAGNOSIS — M797 Fibromyalgia: Secondary | ICD-10-CM | POA: Diagnosis not present

## 2023-11-12 DIAGNOSIS — J4489 Other specified chronic obstructive pulmonary disease: Secondary | ICD-10-CM | POA: Diagnosis not present

## 2023-11-12 DIAGNOSIS — M069 Rheumatoid arthritis, unspecified: Secondary | ICD-10-CM | POA: Diagnosis not present

## 2023-11-12 DIAGNOSIS — I083 Combined rheumatic disorders of mitral, aortic and tricuspid valves: Secondary | ICD-10-CM | POA: Diagnosis not present

## 2023-11-12 DIAGNOSIS — I5032 Chronic diastolic (congestive) heart failure: Secondary | ICD-10-CM | POA: Diagnosis not present

## 2023-11-12 DIAGNOSIS — I480 Paroxysmal atrial fibrillation: Secondary | ICD-10-CM | POA: Diagnosis not present

## 2023-11-13 DIAGNOSIS — J4489 Other specified chronic obstructive pulmonary disease: Secondary | ICD-10-CM | POA: Diagnosis not present

## 2023-11-13 DIAGNOSIS — I429 Cardiomyopathy, unspecified: Secondary | ICD-10-CM | POA: Diagnosis not present

## 2023-11-13 DIAGNOSIS — M069 Rheumatoid arthritis, unspecified: Secondary | ICD-10-CM | POA: Diagnosis not present

## 2023-11-13 DIAGNOSIS — I2510 Atherosclerotic heart disease of native coronary artery without angina pectoris: Secondary | ICD-10-CM | POA: Diagnosis not present

## 2023-11-13 DIAGNOSIS — N179 Acute kidney failure, unspecified: Secondary | ICD-10-CM | POA: Diagnosis not present

## 2023-11-13 DIAGNOSIS — I480 Paroxysmal atrial fibrillation: Secondary | ICD-10-CM | POA: Diagnosis not present

## 2023-11-13 DIAGNOSIS — G9340 Encephalopathy, unspecified: Secondary | ICD-10-CM | POA: Diagnosis not present

## 2023-11-13 DIAGNOSIS — M810 Age-related osteoporosis without current pathological fracture: Secondary | ICD-10-CM | POA: Diagnosis not present

## 2023-11-13 DIAGNOSIS — I083 Combined rheumatic disorders of mitral, aortic and tricuspid valves: Secondary | ICD-10-CM | POA: Diagnosis not present

## 2023-11-13 DIAGNOSIS — M797 Fibromyalgia: Secondary | ICD-10-CM | POA: Diagnosis not present

## 2023-11-13 DIAGNOSIS — J9611 Chronic respiratory failure with hypoxia: Secondary | ICD-10-CM | POA: Diagnosis not present

## 2023-11-13 DIAGNOSIS — I471 Supraventricular tachycardia, unspecified: Secondary | ICD-10-CM | POA: Diagnosis not present

## 2023-11-13 DIAGNOSIS — I5032 Chronic diastolic (congestive) heart failure: Secondary | ICD-10-CM | POA: Diagnosis not present

## 2023-11-13 DIAGNOSIS — R441 Visual hallucinations: Secondary | ICD-10-CM | POA: Diagnosis not present

## 2023-11-13 DIAGNOSIS — I110 Hypertensive heart disease with heart failure: Secondary | ICD-10-CM | POA: Diagnosis not present

## 2023-11-13 DIAGNOSIS — I447 Left bundle-branch block, unspecified: Secondary | ICD-10-CM | POA: Diagnosis not present

## 2023-11-14 ENCOUNTER — Telehealth: Payer: Self-pay

## 2023-11-14 NOTE — Telephone Encounter (Signed)
 Copied from CRM [id number]. Topic: Clinical - Home Health Verbal Orders >> Nov 14, 2023  9:34 AM Joen NOVAK wrote: Caller/Agency: [facility]  [facility]  Callback Number: [id number] Service Requested: Occupational Therapy Frequency: REMOVE OCCUPATIONAL THERAPY TO THE NEXT  Any new concerns about the patient? No

## 2023-11-17 ENCOUNTER — Ambulatory Visit: Payer: Self-pay | Admitting: Adult Health

## 2023-11-17 ENCOUNTER — Telehealth: Payer: Self-pay

## 2023-11-17 DIAGNOSIS — I429 Cardiomyopathy, unspecified: Secondary | ICD-10-CM | POA: Diagnosis not present

## 2023-11-17 DIAGNOSIS — N179 Acute kidney failure, unspecified: Secondary | ICD-10-CM | POA: Diagnosis not present

## 2023-11-17 DIAGNOSIS — I083 Combined rheumatic disorders of mitral, aortic and tricuspid valves: Secondary | ICD-10-CM | POA: Diagnosis not present

## 2023-11-17 DIAGNOSIS — I471 Supraventricular tachycardia, unspecified: Secondary | ICD-10-CM | POA: Diagnosis not present

## 2023-11-17 DIAGNOSIS — I2510 Atherosclerotic heart disease of native coronary artery without angina pectoris: Secondary | ICD-10-CM | POA: Diagnosis not present

## 2023-11-17 DIAGNOSIS — I110 Hypertensive heart disease with heart failure: Secondary | ICD-10-CM | POA: Diagnosis not present

## 2023-11-17 DIAGNOSIS — I480 Paroxysmal atrial fibrillation: Secondary | ICD-10-CM | POA: Diagnosis not present

## 2023-11-17 DIAGNOSIS — M797 Fibromyalgia: Secondary | ICD-10-CM | POA: Diagnosis not present

## 2023-11-17 DIAGNOSIS — M069 Rheumatoid arthritis, unspecified: Secondary | ICD-10-CM | POA: Diagnosis not present

## 2023-11-17 DIAGNOSIS — J4489 Other specified chronic obstructive pulmonary disease: Secondary | ICD-10-CM | POA: Diagnosis not present

## 2023-11-17 DIAGNOSIS — G9340 Encephalopathy, unspecified: Secondary | ICD-10-CM | POA: Diagnosis not present

## 2023-11-17 DIAGNOSIS — R441 Visual hallucinations: Secondary | ICD-10-CM | POA: Diagnosis not present

## 2023-11-17 DIAGNOSIS — I5032 Chronic diastolic (congestive) heart failure: Secondary | ICD-10-CM | POA: Diagnosis not present

## 2023-11-17 DIAGNOSIS — I447 Left bundle-branch block, unspecified: Secondary | ICD-10-CM | POA: Diagnosis not present

## 2023-11-17 DIAGNOSIS — J9611 Chronic respiratory failure with hypoxia: Secondary | ICD-10-CM | POA: Diagnosis not present

## 2023-11-17 DIAGNOSIS — M810 Age-related osteoporosis without current pathological fracture: Secondary | ICD-10-CM | POA: Diagnosis not present

## 2023-11-17 NOTE — Telephone Encounter (Signed)
 Copied from CRM [id number]. Topic: Clinical - Medical Advice >> Nov 17, 2023  1:02 PM Corin V wrote: Reason for CRM: [name], Acupuncturist, with [facility] called to advise on patient condition. Patient is significantly declining. She was unable to put away her meal delivery that was delivered last Thursday and was still on the table today after a neighbor brought it into the house for her. She is not taking her medications correctly, she has poor insight and judgement. She has poor recall and is not remembering conversations she has had with family or home health previously. [name] stated she had never seen the vital signs for this patient this poor and she has been seeing her on and off for years. Vital signs from today's visit are below. [name] feels patient is not wearing her oxygen  at night (but will say she is) as she complained of chest pains at night radiating down her left side Patient is not wanting to go into skilled nursing or assisted living and does not feel she needs more care. [name] feels having someone come into the home for a few hours daily to assist with ADLs and manage medication would be beneficial and the patient may agree to this. Eleanor is worried that if patient does not agree to additional help she will continue to decline and she also feels that Home Health has offered all services possible and they are unable to offer more than they are already doing as patient is non-compliant due to memory issues. Patient and her sister are coming in tomorrow for a follow up and she wanted discussions about next steps or options to start to try and prevent further decline of patient. CRM is also being sent to Triage for [facility] to call patient back for poor vitals today.  Vitals: 78% oxygen  after walking 6 feet from the door to chair. It then took her 5 minutes to get oxygen  back to 90%. She is having heavy tremors in both hands that [name] feels are related to poor  heart health. It took at least 12 minutes of her sitting for [name] to notice her body had stopped trembling. Heart rate was 51 Blood pressure was 138/60 after having her sit for 8 minutes Respirations were 14 Temperature was 97.6

## 2023-11-17 NOTE — Telephone Encounter (Signed)
 Chief Complaint: chest pain Symptoms: moderate SOB, intermittent chest pain, cough Frequency:  SOB worsening since August. Chest pains intermittent x 2 weeks, cough nightly x few days Pertinent Negatives: Patient denies nausea, vomiting, fever,  Disposition: [x] ED /[] Urgent Care (no appt availability in office) / [] Appointment(In office/virtual)/ []  [facility]/ [] Home Care/ [x] Refused Recommended Disposition /[] [facility]/ []  Follow-up with PCP Additional Notes: [name], patient's occupational therapist called in earlier for c/o SOB, hypoxic episode and intermittent chest pains. Patient checked SpO2 and states it is 94% and HR in the 60s. Patient states she does not always wear her oxygen  when she is exerting herself or moving around the house. Advised patient have someone drive her to ED or offered to call EMS. Patient refused and states she has an appointment tomorrow with her PCP.   Copied from CRM [id number]. Topic: Clinical - [facility] Triage >> Nov 17, 2023  1:22 PM Corin V wrote: Reason for Triage: [name], Occupational Therapist, with [facility] called to advise on patient condition. Patient is significantly declining. [name] stated she had never seen the vital signs for this patient this poor and she has been seeing her on and off for years. Vital signs from today's visit are below. [name] feels patient is not wearing her oxygen  at night (but will say she is) as she complained of chest pains at night radiating down her left side CRM [id number] was also sent to office with additional information on long-term care options suggested by [facility]. Patient is set to come into office tomorrow, 01/16/23.  Vitals: 78% oxygen  after walking 6 feet from the door to chair. It then took her 5 minutes to get oxygen  back to 90%. She is having heavy tremors in both hands that [name] feels are related to poor heart health. It took at least 12 minutes of her sitting for [name]  to notice her body had stopped trembling. Heart rate was 51 Blood pressure was 138/60 after having her sit for 8 minutes Respirations were 14 Temperature was 97.6 Reason for Disposition  Pain also in shoulder(s) or arm(s) or jaw  (Exception: Pain is clearly made worse by movement.)  [1] MODERATE difficulty breathing (e.g., speaks in phrases, SOB even at rest) AND [2] worse than normal  Answer Assessment - Initial Assessment Questions 1. LOCATION: Where does it hurt?       Center of chest.  2. RADIATION: Does the pain go anywhere else? (e.g., into neck, jaw, arms, back)     Radiates to left shoulder and elbow.  3. ONSET: When did the chest pain begin? (Minutes, hours or days)      Denies any pain at this time. When it does come on it lasts about 5 minutes, improves after sitting up and deep breathing. A little bit before she went to the hospital 2 weeks ago.  4. PATTERN: Does the pain come and go, or has it been constant since it started?  Does it get worse with exertion?      Comes and goes. She states last week it woke her up out of her sleep.  5. DURATION: How long does it last (e.g., seconds, minutes, hours)     5 minutes.  6. SEVERITY: How bad is the pain?  (e.g., Scale 1-10; mild, moderate, or severe)    - MILD (1-3): doesn't interfere with normal activities     - MODERATE (4-7): interferes with normal activities or awakens from sleep    - SEVERE (8-10):  excruciating pain, unable to do any normal activities       Denies chest pain at this time.   7. CARDIAC RISK FACTORS: Do you have any history of heart problems or risk factors for heart disease? (e.g., angina, prior heart attack; diabetes, high blood pressure, high cholesterol, smoker, or strong family history of heart disease)     A-fib, high blood pressure, strong family history of heart disease.  8. PULMONARY RISK FACTORS: Do you have any history of lung disease?  (e.g., blood clots in lung, asthma,  emphysema, birth control pills)     Home oxygen  use; COPD.  9. CAUSE: What do you think is causing the chest pain?     Patient unsure; she states she has had to have her esophagus stretched in the past and lately when she is eating her last bite feels like it won't go down and she has to throw it up. She states last time it happened was Wednesday.  10. OTHER SYMPTOMS: Do you have any other symptoms? (e.g., dizziness, nausea, vomiting, sweating, fever, difficulty breathing, cough)       Feet swelling, lightheaded comes and goes x quite some time (since last February), cough when lying down the last few nights, SOB.  Answer Assessment - Initial Assessment Questions 1. MAIN CONCERN OR SYMPTOM: What's your main concern? (e.g., low oxygen  level, breathing difficulty) What question do you have?     Low oxygen  saturation today with exertion and moderate SOB.  2.  OXYGEN  EQUIPMENT:  Are you having trouble with your oxygen  equipment?  (e.g., cannula, mask, tubing, tank, concentrator)     Nasal cannula.  3. ONSET: When did the  SOB worsen?      Off an on since August or September.  4. OXYGEN  THERAPY:    - Do you currently use home oxygen ? (e.g., yes, no).    - If Yes, ask: What is your oxygen  source? (e.g., O2 tank, O2 concentrator).    - If Yes, ask: How do you get the oxygen ? (e.g., nasal prongs, face mask).    - If Yes, ask: How much oxygen  are you supposed to use? (e.g., 1-2 L [place])     Yes, nasal prongs 2.5L nightly and states she was told to put it on 3L if she is getting up.   5. PULSE OXIMETER:    - Do you have a pulse oximeter (pulse ox)?  (e.g., yes, no)    - If Yes, ask: Where do you place the probe? (e.g., fingertip, ear lobe)     Fingertip with pulse ox.  6. O2 MONITORING: What is the oxygen  level (pulse ox reading)? (e.g., 70-100%)     Patient reports she was not using her oxygen  when her therapist came by and recorded that it was 74%.   7. VSS  MONITORING: Do you monitor/measure your oxygen  level or vital signs (e.g., yes, no, measurements are automatically sent to provider/call center). Document CURRENT and NORMAL BASELINE values if available.     -  P: What is your pulse rate per minute?   -  RR: What is your respiratory rate per minute?     Pulse was recorded by therapist as 51 and RR was 14 earlier today.  8. BREATHING DIFFICULTY: Are you having any difficulty breathing? If Yes, ask: How bad is it?  (e.g., none, mild, moderate, severe)   - MILD: No SOB at rest, mild SOB with walking, speaks normally in sentences, able to lie down, no  retractions, pulse < 100.   - MODERATE: SOB at rest, SOB with minimal exertion and prefers to sit, cannot lie down flat, speaks in phrases, mild retractions, audible wheezing, pulse 100-120.   - SEVERE: Very SOB at rest, speaks in single words, struggling to breathe, sitting hunched forward, retractions, pulse > 120      Moderate the past few days and severe sometimes.  9. OTHER SYMPTOMS: Do you have any other symptoms? (e.g., fever, change in sputum)     Intermittent chest pain, cough at night.  10. SMOKING: Do you smoke currently? Is there anyone that smokes around you?  (Note: smoking around oxygen  is dangerous!)       Denies history of smoking.  Protocols used: Chest Pain-A-AH, COPD Oxygen  Monitoring and Hypoxia-A-AH

## 2023-11-18 ENCOUNTER — Inpatient Hospital Stay: Payer: Medicare Other | Admitting: Adult Health

## 2023-11-18 NOTE — Progress Notes (Deleted)
   Subjective:    Patient ID: Julie Rice, female    DOB: 1936-10-22, 88 y.o.   MRN: [id number]  HPI 88 year old female who  has a past medical history of A-fib (HCC), Anemia, Anxiety, Bipolar disorder (HCC), Blood transfusion (1991), CAD (coronary artery disease), Cataract, Chest pain, CHF (congestive heart failure) (HCC) (07/2020), Chronic back pain, COPD (chronic obstructive pulmonary disease) (HCC), Depression, Esophageal stricture, Fibromyalgia, GERD (gastroesophageal reflux disease), Heart murmur, Hiatal hernia, Hyperlipidemia, Hypertension, Internal hemorrhoids, Lumbago, Mitral regurgitation, Osteoarthritis, Osteoporosis, Pneumonia (~ 01/2018), PONV (postoperative nausea and vomiting), Rectal bleeding, Rheumatoid arthritis (HCC), Spondylosis, TIA (transient ischemic attack) (2013), and Urinary incontinence.  She presents to the office today for TCM visit  Admit Date: 11/03/2023 Discharge Date 11/07/2023  She presented to the emergency room with weakness and confusion; she was found by a neighbor complaining of lightheadedness, visual hallucinations, and leg weakness.  Hospital Course  AKI on CKD stage IIIb  -Her baseline creatinine prior to this admission was 0.8, on admission her creatinine was 1.9.  She was given fluid and diuretics and losartan  were  held.  Creatinine improved to 1.4 but then stayed there.    Acute metabolic encephalopathy -Presented with some acute confusion.  This resolved with IV fluids and holding her diuretics.  She was evaluated by PT/OT.  Skilled nursing facility acute rehab was recommended but the patient declined.  She was discharged with recommendation to stay with family or seek long-term care  Benign essential tremor -Continue monitoring    Review of Systems  Constitutional: Negative.   HENT: Negative.    Eyes: Negative.   Respiratory: Negative.    Cardiovascular: Negative.   Gastrointestinal: Negative.   Endocrine: Negative.   Genitourinary:  Negative.   Musculoskeletal: Negative.   Skin: Negative.   Allergic/Immunologic: Negative.   Neurological: Negative.   Hematological: Negative.   Psychiatric/Behavioral: Negative.         Objective:   Physical Exam Vitals and nursing note reviewed.  Constitutional:      Appearance: Normal appearance. She is obese.  Cardiovascular:     Rate and Rhythm: Normal rate and regular rhythm.     Pulses: Normal pulses.     Heart sounds: Normal heart sounds.  Pulmonary:     Effort: Pulmonary effort is normal.     Breath sounds: Normal breath sounds.  Skin:    General: Skin is warm and dry.  Neurological:     General: No focal deficit present.     Mental Status: She is alert and oriented to person, place, and time.  Psychiatric:        Mood and Affect: Mood normal.        Behavior: Behavior normal.        Thought Content: Thought content normal.        Judgment: Judgment normal.           Assessment & Plan:

## 2023-11-20 DIAGNOSIS — I083 Combined rheumatic disorders of mitral, aortic and tricuspid valves: Secondary | ICD-10-CM | POA: Diagnosis not present

## 2023-11-20 DIAGNOSIS — R441 Visual hallucinations: Secondary | ICD-10-CM | POA: Diagnosis not present

## 2023-11-20 DIAGNOSIS — I2510 Atherosclerotic heart disease of native coronary artery without angina pectoris: Secondary | ICD-10-CM | POA: Diagnosis not present

## 2023-11-20 DIAGNOSIS — I480 Paroxysmal atrial fibrillation: Secondary | ICD-10-CM | POA: Diagnosis not present

## 2023-11-20 DIAGNOSIS — N179 Acute kidney failure, unspecified: Secondary | ICD-10-CM | POA: Diagnosis not present

## 2023-11-20 DIAGNOSIS — I110 Hypertensive heart disease with heart failure: Secondary | ICD-10-CM | POA: Diagnosis not present

## 2023-11-20 DIAGNOSIS — I447 Left bundle-branch block, unspecified: Secondary | ICD-10-CM | POA: Diagnosis not present

## 2023-11-20 DIAGNOSIS — J4489 Other specified chronic obstructive pulmonary disease: Secondary | ICD-10-CM | POA: Diagnosis not present

## 2023-11-20 DIAGNOSIS — I471 Supraventricular tachycardia, unspecified: Secondary | ICD-10-CM | POA: Diagnosis not present

## 2023-11-20 DIAGNOSIS — M069 Rheumatoid arthritis, unspecified: Secondary | ICD-10-CM | POA: Diagnosis not present

## 2023-11-20 DIAGNOSIS — I5032 Chronic diastolic (congestive) heart failure: Secondary | ICD-10-CM | POA: Diagnosis not present

## 2023-11-20 DIAGNOSIS — M810 Age-related osteoporosis without current pathological fracture: Secondary | ICD-10-CM | POA: Diagnosis not present

## 2023-11-20 DIAGNOSIS — I429 Cardiomyopathy, unspecified: Secondary | ICD-10-CM | POA: Diagnosis not present

## 2023-11-20 DIAGNOSIS — M797 Fibromyalgia: Secondary | ICD-10-CM | POA: Diagnosis not present

## 2023-11-20 DIAGNOSIS — J9611 Chronic respiratory failure with hypoxia: Secondary | ICD-10-CM | POA: Diagnosis not present

## 2023-11-20 DIAGNOSIS — G9340 Encephalopathy, unspecified: Secondary | ICD-10-CM | POA: Diagnosis not present

## 2023-11-21 DIAGNOSIS — I5032 Chronic diastolic (congestive) heart failure: Secondary | ICD-10-CM | POA: Diagnosis not present

## 2023-11-21 DIAGNOSIS — I429 Cardiomyopathy, unspecified: Secondary | ICD-10-CM | POA: Diagnosis not present

## 2023-11-21 DIAGNOSIS — N179 Acute kidney failure, unspecified: Secondary | ICD-10-CM | POA: Diagnosis not present

## 2023-11-21 DIAGNOSIS — G9340 Encephalopathy, unspecified: Secondary | ICD-10-CM | POA: Diagnosis not present

## 2023-11-21 DIAGNOSIS — J9611 Chronic respiratory failure with hypoxia: Secondary | ICD-10-CM | POA: Diagnosis not present

## 2023-11-21 DIAGNOSIS — R441 Visual hallucinations: Secondary | ICD-10-CM | POA: Diagnosis not present

## 2023-11-21 DIAGNOSIS — M810 Age-related osteoporosis without current pathological fracture: Secondary | ICD-10-CM | POA: Diagnosis not present

## 2023-11-21 DIAGNOSIS — I110 Hypertensive heart disease with heart failure: Secondary | ICD-10-CM | POA: Diagnosis not present

## 2023-11-21 DIAGNOSIS — M069 Rheumatoid arthritis, unspecified: Secondary | ICD-10-CM | POA: Diagnosis not present

## 2023-11-21 DIAGNOSIS — I447 Left bundle-branch block, unspecified: Secondary | ICD-10-CM | POA: Diagnosis not present

## 2023-11-21 DIAGNOSIS — I480 Paroxysmal atrial fibrillation: Secondary | ICD-10-CM | POA: Diagnosis not present

## 2023-11-21 DIAGNOSIS — I2510 Atherosclerotic heart disease of native coronary artery without angina pectoris: Secondary | ICD-10-CM | POA: Diagnosis not present

## 2023-11-21 DIAGNOSIS — I471 Supraventricular tachycardia, unspecified: Secondary | ICD-10-CM | POA: Diagnosis not present

## 2023-11-21 DIAGNOSIS — J4489 Other specified chronic obstructive pulmonary disease: Secondary | ICD-10-CM | POA: Diagnosis not present

## 2023-11-21 DIAGNOSIS — I083 Combined rheumatic disorders of mitral, aortic and tricuspid valves: Secondary | ICD-10-CM | POA: Diagnosis not present

## 2023-11-21 DIAGNOSIS — M797 Fibromyalgia: Secondary | ICD-10-CM | POA: Diagnosis not present

## 2023-11-24 DIAGNOSIS — I110 Hypertensive heart disease with heart failure: Secondary | ICD-10-CM | POA: Diagnosis not present

## 2023-11-24 DIAGNOSIS — G9340 Encephalopathy, unspecified: Secondary | ICD-10-CM | POA: Diagnosis not present

## 2023-11-24 DIAGNOSIS — J4489 Other specified chronic obstructive pulmonary disease: Secondary | ICD-10-CM | POA: Diagnosis not present

## 2023-11-24 DIAGNOSIS — I2510 Atherosclerotic heart disease of native coronary artery without angina pectoris: Secondary | ICD-10-CM | POA: Diagnosis not present

## 2023-11-24 DIAGNOSIS — I480 Paroxysmal atrial fibrillation: Secondary | ICD-10-CM | POA: Diagnosis not present

## 2023-11-24 DIAGNOSIS — R441 Visual hallucinations: Secondary | ICD-10-CM | POA: Diagnosis not present

## 2023-11-24 DIAGNOSIS — M810 Age-related osteoporosis without current pathological fracture: Secondary | ICD-10-CM | POA: Diagnosis not present

## 2023-11-24 DIAGNOSIS — I429 Cardiomyopathy, unspecified: Secondary | ICD-10-CM | POA: Diagnosis not present

## 2023-11-24 DIAGNOSIS — I083 Combined rheumatic disorders of mitral, aortic and tricuspid valves: Secondary | ICD-10-CM | POA: Diagnosis not present

## 2023-11-24 DIAGNOSIS — I5032 Chronic diastolic (congestive) heart failure: Secondary | ICD-10-CM | POA: Diagnosis not present

## 2023-11-24 DIAGNOSIS — N179 Acute kidney failure, unspecified: Secondary | ICD-10-CM | POA: Diagnosis not present

## 2023-11-24 DIAGNOSIS — I471 Supraventricular tachycardia, unspecified: Secondary | ICD-10-CM | POA: Diagnosis not present

## 2023-11-24 DIAGNOSIS — I447 Left bundle-branch block, unspecified: Secondary | ICD-10-CM | POA: Diagnosis not present

## 2023-11-24 DIAGNOSIS — M069 Rheumatoid arthritis, unspecified: Secondary | ICD-10-CM | POA: Diagnosis not present

## 2023-11-24 DIAGNOSIS — J9611 Chronic respiratory failure with hypoxia: Secondary | ICD-10-CM | POA: Diagnosis not present

## 2023-11-24 DIAGNOSIS — M797 Fibromyalgia: Secondary | ICD-10-CM | POA: Diagnosis not present

## 2023-11-25 ENCOUNTER — Inpatient Hospital Stay: Payer: Medicare Other | Admitting: Adult Health

## 2023-11-26 DIAGNOSIS — M810 Age-related osteoporosis without current pathological fracture: Secondary | ICD-10-CM | POA: Diagnosis not present

## 2023-11-26 DIAGNOSIS — M797 Fibromyalgia: Secondary | ICD-10-CM | POA: Diagnosis not present

## 2023-11-26 DIAGNOSIS — I2510 Atherosclerotic heart disease of native coronary artery without angina pectoris: Secondary | ICD-10-CM | POA: Diagnosis not present

## 2023-11-26 DIAGNOSIS — J4489 Other specified chronic obstructive pulmonary disease: Secondary | ICD-10-CM | POA: Diagnosis not present

## 2023-11-26 DIAGNOSIS — I5032 Chronic diastolic (congestive) heart failure: Secondary | ICD-10-CM | POA: Diagnosis not present

## 2023-11-26 DIAGNOSIS — J9611 Chronic respiratory failure with hypoxia: Secondary | ICD-10-CM | POA: Diagnosis not present

## 2023-11-26 DIAGNOSIS — M069 Rheumatoid arthritis, unspecified: Secondary | ICD-10-CM | POA: Diagnosis not present

## 2023-11-26 DIAGNOSIS — I110 Hypertensive heart disease with heart failure: Secondary | ICD-10-CM | POA: Diagnosis not present

## 2023-11-26 DIAGNOSIS — I083 Combined rheumatic disorders of mitral, aortic and tricuspid valves: Secondary | ICD-10-CM | POA: Diagnosis not present

## 2023-11-26 DIAGNOSIS — I471 Supraventricular tachycardia, unspecified: Secondary | ICD-10-CM | POA: Diagnosis not present

## 2023-11-26 DIAGNOSIS — N179 Acute kidney failure, unspecified: Secondary | ICD-10-CM | POA: Diagnosis not present

## 2023-11-26 DIAGNOSIS — R441 Visual hallucinations: Secondary | ICD-10-CM | POA: Diagnosis not present

## 2023-11-26 DIAGNOSIS — I447 Left bundle-branch block, unspecified: Secondary | ICD-10-CM | POA: Diagnosis not present

## 2023-11-26 DIAGNOSIS — I480 Paroxysmal atrial fibrillation: Secondary | ICD-10-CM | POA: Diagnosis not present

## 2023-11-26 DIAGNOSIS — G9340 Encephalopathy, unspecified: Secondary | ICD-10-CM | POA: Diagnosis not present

## 2023-11-26 DIAGNOSIS — I429 Cardiomyopathy, unspecified: Secondary | ICD-10-CM | POA: Diagnosis not present

## 2023-12-01 DIAGNOSIS — I5032 Chronic diastolic (congestive) heart failure: Secondary | ICD-10-CM | POA: Diagnosis not present

## 2023-12-01 DIAGNOSIS — M069 Rheumatoid arthritis, unspecified: Secondary | ICD-10-CM | POA: Diagnosis not present

## 2023-12-01 DIAGNOSIS — R441 Visual hallucinations: Secondary | ICD-10-CM | POA: Diagnosis not present

## 2023-12-01 DIAGNOSIS — I447 Left bundle-branch block, unspecified: Secondary | ICD-10-CM | POA: Diagnosis not present

## 2023-12-01 DIAGNOSIS — J9611 Chronic respiratory failure with hypoxia: Secondary | ICD-10-CM | POA: Diagnosis not present

## 2023-12-01 DIAGNOSIS — M810 Age-related osteoporosis without current pathological fracture: Secondary | ICD-10-CM | POA: Diagnosis not present

## 2023-12-01 DIAGNOSIS — I471 Supraventricular tachycardia, unspecified: Secondary | ICD-10-CM | POA: Diagnosis not present

## 2023-12-01 DIAGNOSIS — I083 Combined rheumatic disorders of mitral, aortic and tricuspid valves: Secondary | ICD-10-CM | POA: Diagnosis not present

## 2023-12-01 DIAGNOSIS — I480 Paroxysmal atrial fibrillation: Secondary | ICD-10-CM | POA: Diagnosis not present

## 2023-12-01 DIAGNOSIS — I2510 Atherosclerotic heart disease of native coronary artery without angina pectoris: Secondary | ICD-10-CM | POA: Diagnosis not present

## 2023-12-01 DIAGNOSIS — M797 Fibromyalgia: Secondary | ICD-10-CM | POA: Diagnosis not present

## 2023-12-01 DIAGNOSIS — G9340 Encephalopathy, unspecified: Secondary | ICD-10-CM | POA: Diagnosis not present

## 2023-12-01 DIAGNOSIS — I429 Cardiomyopathy, unspecified: Secondary | ICD-10-CM | POA: Diagnosis not present

## 2023-12-01 DIAGNOSIS — N179 Acute kidney failure, unspecified: Secondary | ICD-10-CM | POA: Diagnosis not present

## 2023-12-01 DIAGNOSIS — I110 Hypertensive heart disease with heart failure: Secondary | ICD-10-CM | POA: Diagnosis not present

## 2023-12-01 DIAGNOSIS — J4489 Other specified chronic obstructive pulmonary disease: Secondary | ICD-10-CM | POA: Diagnosis not present

## 2023-12-03 ENCOUNTER — Telehealth: Payer: Self-pay | Admitting: Adult Health

## 2023-12-03 NOTE — Telephone Encounter (Signed)
E form was faxed with confirmation.

## 2023-12-03 NOTE — Telephone Encounter (Signed)
Copied from CRM [id number]. Topic: General - Other >> Dec 03, 2023 12:30 PM Orinda Kenner C wrote: Reason for CRM: Isabelle Course from [facility] [phone] following up on MSW social worker file which was faxed 11/28/23 and original sent 11/14/23. Order number is [id number] and fax # [phone].

## 2023-12-04 DIAGNOSIS — I480 Paroxysmal atrial fibrillation: Secondary | ICD-10-CM | POA: Diagnosis not present

## 2023-12-04 DIAGNOSIS — N179 Acute kidney failure, unspecified: Secondary | ICD-10-CM | POA: Diagnosis not present

## 2023-12-04 DIAGNOSIS — J9611 Chronic respiratory failure with hypoxia: Secondary | ICD-10-CM | POA: Diagnosis not present

## 2023-12-04 DIAGNOSIS — M069 Rheumatoid arthritis, unspecified: Secondary | ICD-10-CM | POA: Diagnosis not present

## 2023-12-04 DIAGNOSIS — I5032 Chronic diastolic (congestive) heart failure: Secondary | ICD-10-CM | POA: Diagnosis not present

## 2023-12-04 DIAGNOSIS — M797 Fibromyalgia: Secondary | ICD-10-CM | POA: Diagnosis not present

## 2023-12-04 DIAGNOSIS — G9340 Encephalopathy, unspecified: Secondary | ICD-10-CM | POA: Diagnosis not present

## 2023-12-04 DIAGNOSIS — I429 Cardiomyopathy, unspecified: Secondary | ICD-10-CM | POA: Diagnosis not present

## 2023-12-04 DIAGNOSIS — M810 Age-related osteoporosis without current pathological fracture: Secondary | ICD-10-CM | POA: Diagnosis not present

## 2023-12-04 DIAGNOSIS — R441 Visual hallucinations: Secondary | ICD-10-CM | POA: Diagnosis not present

## 2023-12-04 DIAGNOSIS — I083 Combined rheumatic disorders of mitral, aortic and tricuspid valves: Secondary | ICD-10-CM | POA: Diagnosis not present

## 2023-12-04 DIAGNOSIS — I2510 Atherosclerotic heart disease of native coronary artery without angina pectoris: Secondary | ICD-10-CM | POA: Diagnosis not present

## 2023-12-04 DIAGNOSIS — J4489 Other specified chronic obstructive pulmonary disease: Secondary | ICD-10-CM | POA: Diagnosis not present

## 2023-12-04 DIAGNOSIS — I447 Left bundle-branch block, unspecified: Secondary | ICD-10-CM | POA: Diagnosis not present

## 2023-12-04 DIAGNOSIS — I471 Supraventricular tachycardia, unspecified: Secondary | ICD-10-CM | POA: Diagnosis not present

## 2023-12-04 DIAGNOSIS — I110 Hypertensive heart disease with heart failure: Secondary | ICD-10-CM | POA: Diagnosis not present

## 2023-12-09 ENCOUNTER — Inpatient Hospital Stay: Payer: Medicare Other | Admitting: Adult Health

## 2023-12-09 ENCOUNTER — Other Ambulatory Visit: Payer: Self-pay | Admitting: Adult Health

## 2023-12-10 ENCOUNTER — Telehealth: Payer: Self-pay

## 2023-12-10 NOTE — Telephone Encounter (Signed)
 Copied from CRM [id number]. Topic: General - Other >> Dec 10, 2023 12:03 PM Valeri Gate H wrote: Reason for CRM: Patient's sister in law Diane called to inform Randel Buss that the patient has passed away.

## 2023-12-10 NOTE — Telephone Encounter (Signed)
 Provider aware

## 2023-12-11 ENCOUNTER — Telehealth: Payer: Self-pay | Admitting: Adult Health

## 2023-12-11 ENCOUNTER — Inpatient Hospital Stay: Payer: Medicare Other | Admitting: Adult Health

## 2023-12-11 NOTE — Telephone Encounter (Signed)
 I spoke to patients sister who informed me that Paradise passed away on 12-30-2023. My condolences were given.

## 2024-01-03 DEATH — deceased

## 2024-01-09 ENCOUNTER — Ambulatory Visit: Payer: Medicare Other | Admitting: Internal Medicine

## 2024-01-20 ENCOUNTER — Ambulatory Visit: Payer: Medicare Other | Admitting: Internal Medicine
